# Patient Record
Sex: Female | Born: 1940
Health system: Southern US, Community
[De-identification: ages and names within clinical notes are randomized; demographics above are authoritative.]

## PROBLEM LIST (undated history)

## (undated) DIAGNOSIS — Z5189 Encounter for other specified aftercare: Secondary | ICD-10-CM

## (undated) DIAGNOSIS — Z86010 Personal history of colonic polyps: Secondary | ICD-10-CM

## (undated) DIAGNOSIS — K279 Peptic ulcer, site unspecified, unspecified as acute or chronic, without hemorrhage or perforation: Secondary | ICD-10-CM

## (undated) DIAGNOSIS — J309 Allergic rhinitis, unspecified: Secondary | ICD-10-CM

## (undated) DIAGNOSIS — K219 Gastro-esophageal reflux disease without esophagitis: Secondary | ICD-10-CM

## (undated) DIAGNOSIS — I10 Essential (primary) hypertension: Secondary | ICD-10-CM

## (undated) DIAGNOSIS — I2510 Atherosclerotic heart disease of native coronary artery without angina pectoris: Secondary | ICD-10-CM

## (undated) DIAGNOSIS — M810 Age-related osteoporosis without current pathological fracture: Secondary | ICD-10-CM

## (undated) DIAGNOSIS — G4700 Insomnia, unspecified: Secondary | ICD-10-CM

## (undated) DIAGNOSIS — Z8673 Personal history of transient ischemic attack (TIA), and cerebral infarction without residual deficits: Secondary | ICD-10-CM

## (undated) DIAGNOSIS — E785 Hyperlipidemia, unspecified: Secondary | ICD-10-CM

## (undated) DIAGNOSIS — I480 Paroxysmal atrial fibrillation: Secondary | ICD-10-CM

## (undated) DIAGNOSIS — D649 Anemia, unspecified: Secondary | ICD-10-CM

## (undated) DIAGNOSIS — K5520 Angiodysplasia of colon without hemorrhage: Secondary | ICD-10-CM

## (undated) DIAGNOSIS — I350 Nonrheumatic aortic (valve) stenosis: Secondary | ICD-10-CM

## (undated) DIAGNOSIS — I219 Acute myocardial infarction, unspecified: Secondary | ICD-10-CM

## (undated) DIAGNOSIS — E119 Type 2 diabetes mellitus without complications: Principal | ICD-10-CM

## (undated) DIAGNOSIS — F329 Major depressive disorder, single episode, unspecified: Secondary | ICD-10-CM

## (undated) DIAGNOSIS — M1712 Unilateral primary osteoarthritis, left knee: Secondary | ICD-10-CM

## (undated) HISTORY — DX: Paroxysmal atrial fibrillation: I48.0

## (undated) HISTORY — DX: Unilateral primary osteoarthritis, left knee: M17.12

## (undated) HISTORY — DX: Allergic rhinitis, unspecified: J30.9

## (undated) HISTORY — DX: Hyperlipidemia, unspecified: E78.5

## (undated) HISTORY — DX: Type 2 diabetes mellitus without complications: E11.9

## (undated) HISTORY — PX: APPENDECTOMY: SHX54

## (undated) HISTORY — DX: Nonrheumatic aortic (valve) stenosis: I35.0

## (undated) HISTORY — DX: Anemia, unspecified: D64.9

## (undated) HISTORY — DX: Peptic ulcer, site unspecified, unspecified as acute or chronic, without hemorrhage or perforation: K27.9

## (undated) HISTORY — PX: TONSILLECTOMY: SUR1361

## (undated) HISTORY — DX: Personal history of transient ischemic attack (TIA), and cerebral infarction without residual deficits: Z86.73

## (undated) HISTORY — DX: Angiodysplasia of colon without hemorrhage: K55.20

## (undated) HISTORY — DX: Encounter for other specified aftercare: Z51.89

## (undated) HISTORY — PX: TRANSTHORACIC ECHOCARDIOGRAM: SHX275

## (undated) HISTORY — DX: Major depressive disorder, single episode, unspecified: F32.9

## (undated) HISTORY — DX: Insomnia, unspecified: G47.00

## (undated) HISTORY — DX: Age-related osteoporosis without current pathological fracture: M81.0

## (undated) HISTORY — PX: BREAST BIOPSY: SHX20

## (undated) HISTORY — DX: Acute myocardial infarction, unspecified: I21.9

## (undated) HISTORY — DX: Essential (primary) hypertension: I10

## (undated) HISTORY — DX: Atherosclerotic heart disease of native coronary artery without angina pectoris: I25.10

## (undated) HISTORY — DX: Gastro-esophageal reflux disease without esophagitis: K21.9

## (undated) HISTORY — PX: TUBAL LIGATION: SHX77

## (undated) HISTORY — DX: Personal history of colonic polyps: Z86.010

---

## 2001-10-29 ENCOUNTER — Encounter: Admission: RE | Admit: 2001-10-29 | Discharge: 2001-10-29 | Payer: Self-pay | Admitting: Internal Medicine

## 2001-11-05 ENCOUNTER — Encounter: Admission: RE | Admit: 2001-11-05 | Discharge: 2001-11-05 | Payer: Self-pay | Admitting: Internal Medicine

## 2001-11-11 ENCOUNTER — Encounter: Admission: RE | Admit: 2001-11-11 | Discharge: 2001-11-11 | Payer: Self-pay | Admitting: Internal Medicine

## 2001-11-26 ENCOUNTER — Encounter: Admission: RE | Admit: 2001-11-26 | Discharge: 2001-11-26 | Payer: Self-pay | Admitting: Internal Medicine

## 2001-12-19 ENCOUNTER — Encounter: Admission: RE | Admit: 2001-12-19 | Discharge: 2001-12-19 | Payer: Self-pay | Admitting: Internal Medicine

## 2002-02-06 LAB — HM MAMMOGRAPHY

## 2002-03-19 ENCOUNTER — Encounter: Admission: RE | Admit: 2002-03-19 | Discharge: 2002-03-19 | Payer: Self-pay | Admitting: Internal Medicine

## 2002-03-25 ENCOUNTER — Encounter: Admission: RE | Admit: 2002-03-25 | Discharge: 2002-03-25 | Payer: Self-pay | Admitting: Internal Medicine

## 2002-03-27 ENCOUNTER — Encounter: Admission: RE | Admit: 2002-03-27 | Discharge: 2002-03-27 | Payer: Self-pay | Admitting: Internal Medicine

## 2002-06-12 ENCOUNTER — Encounter: Admission: RE | Admit: 2002-06-12 | Discharge: 2002-06-12 | Payer: Self-pay | Admitting: Internal Medicine

## 2002-06-12 ENCOUNTER — Ambulatory Visit (HOSPITAL_COMMUNITY): Admission: RE | Admit: 2002-06-12 | Discharge: 2002-06-12 | Payer: Self-pay | Admitting: Internal Medicine

## 2002-07-10 ENCOUNTER — Encounter: Admission: RE | Admit: 2002-07-10 | Discharge: 2002-07-10 | Payer: Self-pay | Admitting: Internal Medicine

## 2002-10-03 ENCOUNTER — Encounter: Admission: RE | Admit: 2002-10-03 | Discharge: 2002-10-03 | Payer: Self-pay | Admitting: Internal Medicine

## 2002-10-23 ENCOUNTER — Ambulatory Visit (HOSPITAL_COMMUNITY): Admission: RE | Admit: 2002-10-23 | Discharge: 2002-10-23 | Payer: Self-pay | Admitting: Internal Medicine

## 2002-10-23 ENCOUNTER — Encounter: Payer: Self-pay | Admitting: Cardiology

## 2002-11-17 ENCOUNTER — Encounter: Admission: RE | Admit: 2002-11-17 | Discharge: 2002-11-17 | Payer: Self-pay | Admitting: Internal Medicine

## 2002-11-25 ENCOUNTER — Emergency Department (HOSPITAL_COMMUNITY): Admission: EM | Admit: 2002-11-25 | Discharge: 2002-11-25 | Payer: Self-pay | Admitting: Emergency Medicine

## 2002-12-08 ENCOUNTER — Encounter: Admission: RE | Admit: 2002-12-08 | Discharge: 2002-12-08 | Payer: Self-pay | Admitting: Internal Medicine

## 2002-12-16 ENCOUNTER — Encounter: Admission: RE | Admit: 2002-12-16 | Discharge: 2002-12-16 | Payer: Self-pay | Admitting: Internal Medicine

## 2002-12-26 ENCOUNTER — Encounter: Admission: RE | Admit: 2002-12-26 | Discharge: 2002-12-26 | Payer: Self-pay | Admitting: Internal Medicine

## 2003-03-05 ENCOUNTER — Encounter: Admission: RE | Admit: 2003-03-05 | Discharge: 2003-03-05 | Payer: Self-pay | Admitting: Internal Medicine

## 2003-07-16 ENCOUNTER — Encounter: Admission: RE | Admit: 2003-07-16 | Discharge: 2003-07-16 | Payer: Self-pay | Admitting: Internal Medicine

## 2003-10-13 ENCOUNTER — Encounter: Admission: RE | Admit: 2003-10-13 | Discharge: 2004-01-11 | Payer: Self-pay | Admitting: Internal Medicine

## 2004-09-21 ENCOUNTER — Ambulatory Visit: Payer: Self-pay | Admitting: Internal Medicine

## 2004-09-28 ENCOUNTER — Ambulatory Visit: Payer: Self-pay | Admitting: Internal Medicine

## 2005-02-22 ENCOUNTER — Ambulatory Visit: Payer: Self-pay | Admitting: Internal Medicine

## 2005-12-01 ENCOUNTER — Ambulatory Visit: Payer: Self-pay | Admitting: Internal Medicine

## 2005-12-01 LAB — CONVERTED CEMR LAB
ALT: 29 units/L (ref 0–40)
AST: 22 units/L (ref 0–37)
Albumin: 3.7 g/dL (ref 3.5–5.2)
Alkaline Phosphatase: 48 units/L (ref 39–117)
BUN: 11 mg/dL (ref 6–23)
Bacteria, U Microscopic: NEGATIVE /hpf
Basophils Absolute: 0 10*3/uL (ref 0.0–0.1)
Basophils Relative: 0.5 % (ref 0.0–1.0)
Bilirubin Urine: NEGATIVE
CO2: 31 meq/L (ref 19–32)
Calcium: 10.3 mg/dL (ref 8.4–10.5)
Chloride: 106 meq/L (ref 96–112)
Chol/HDL Ratio, serum: 4.8
Cholesterol: 170 mg/dL (ref 0–200)
Creatinine, Ser: 0.9 mg/dL (ref 0.4–1.2)
Crystals: NEGATIVE
Eosinophil percent: 2.3 % (ref 0.0–5.0)
GFR calc non Af Amer: 66 mL/min
Glomerular Filtration Rate, Af Am: 80 mL/min/{1.73_m2}
Glucose, Bld: 127 mg/dL — ABNORMAL HIGH (ref 70–99)
HCT: 39.4 % (ref 36.0–46.0)
HDL: 35.5 mg/dL — ABNORMAL LOW (ref >39.0)
Hemoglobin, Urine: NEGATIVE
Hemoglobin: 13.4 g/dL (ref 12.0–15.0)
Hgb A1c MFr Bld: 6.1 % — ABNORMAL HIGH (ref 4.6–6.0)
Ketones, ur: NEGATIVE mg/dL
LDL Cholesterol: 117 mg/dL — ABNORMAL HIGH (ref 0–99)
Lymphocytes Relative: 39.4 % (ref 12.0–46.0)
MCHC: 33.9 g/dL (ref 30.0–36.0)
MCV: 95.5 fL (ref 78.0–100.0)
Monocytes Absolute: 0.4 10*3/uL (ref 0.2–0.7)
Monocytes Relative: 8.4 % (ref 3.0–11.0)
Mucus, UA: NEGATIVE
Neutro Abs: 2.2 10*3/uL (ref 1.4–7.7)
Neutrophils Relative %: 49.4 % (ref 43.0–77.0)
Nitrite: NEGATIVE
Platelets: 249 10*3/uL (ref 150–400)
Potassium: 4.5 meq/L (ref 3.5–5.1)
RBC / HPF: NONE SEEN
RBC: 4.12 M/uL (ref 3.87–5.11)
RDW: 11.7 % (ref 11.5–14.6)
Sodium: 139 meq/L (ref 135–145)
Specific Gravity, Urine: 1.02 (ref 1.000–1.03)
TSH: 2.43 microintl units/mL (ref 0.35–5.50)
Total Bilirubin: 0.8 mg/dL (ref 0.3–1.2)
Total Protein, Urine: NEGATIVE mg/dL
Total Protein: 6.9 g/dL (ref 6.0–8.3)
Triglyceride fasting, serum: 90 mg/dL (ref 0–149)
Urine Glucose: NEGATIVE mg/dL
Urobilinogen, UA: 0.2 (ref 0.0–1.0)
VLDL: 18 mg/dL (ref 0–40)
WBC: 4.5 10*3/uL (ref 4.5–10.5)
pH: 6.5 (ref 5.0–8.0)

## 2005-12-05 ENCOUNTER — Ambulatory Visit: Payer: Self-pay | Admitting: Family Medicine

## 2006-02-07 ENCOUNTER — Ambulatory Visit: Payer: Self-pay | Admitting: Internal Medicine

## 2006-10-05 ENCOUNTER — Encounter: Payer: Self-pay | Admitting: *Deleted

## 2006-10-05 DIAGNOSIS — R011 Cardiac murmur, unspecified: Secondary | ICD-10-CM

## 2006-10-05 DIAGNOSIS — E1122 Type 2 diabetes mellitus with diabetic chronic kidney disease: Secondary | ICD-10-CM | POA: Insufficient documentation

## 2006-10-05 DIAGNOSIS — E119 Type 2 diabetes mellitus without complications: Secondary | ICD-10-CM

## 2006-10-05 DIAGNOSIS — E1165 Type 2 diabetes mellitus with hyperglycemia: Secondary | ICD-10-CM | POA: Insufficient documentation

## 2006-10-05 HISTORY — DX: Type 2 diabetes mellitus without complications: E11.9

## 2006-10-08 DIAGNOSIS — F418 Other specified anxiety disorders: Secondary | ICD-10-CM | POA: Insufficient documentation

## 2006-10-08 DIAGNOSIS — K279 Peptic ulcer, site unspecified, unspecified as acute or chronic, without hemorrhage or perforation: Secondary | ICD-10-CM | POA: Insufficient documentation

## 2006-10-08 DIAGNOSIS — I10 Essential (primary) hypertension: Secondary | ICD-10-CM

## 2006-10-08 DIAGNOSIS — E1169 Type 2 diabetes mellitus with other specified complication: Secondary | ICD-10-CM | POA: Insufficient documentation

## 2006-10-08 DIAGNOSIS — E785 Hyperlipidemia, unspecified: Secondary | ICD-10-CM

## 2006-10-08 DIAGNOSIS — F329 Major depressive disorder, single episode, unspecified: Secondary | ICD-10-CM

## 2006-10-08 DIAGNOSIS — F3289 Other specified depressive episodes: Secondary | ICD-10-CM

## 2006-10-08 HISTORY — DX: Major depressive disorder, single episode, unspecified: F32.9

## 2006-10-08 HISTORY — DX: Peptic ulcer, site unspecified, unspecified as acute or chronic, without hemorrhage or perforation: K27.9

## 2006-10-08 HISTORY — DX: Other specified depressive episodes: F32.89

## 2006-10-08 HISTORY — DX: Essential (primary) hypertension: I10

## 2006-10-08 HISTORY — DX: Hyperlipidemia, unspecified: E78.5

## 2007-02-11 ENCOUNTER — Encounter: Payer: Self-pay | Admitting: Internal Medicine

## 2007-02-25 ENCOUNTER — Encounter: Payer: Self-pay | Admitting: Internal Medicine

## 2007-06-24 ENCOUNTER — Ambulatory Visit: Payer: Self-pay | Admitting: Internal Medicine

## 2007-06-24 DIAGNOSIS — Z860100 Personal history of colon polyps, unspecified: Secondary | ICD-10-CM | POA: Insufficient documentation

## 2007-06-24 DIAGNOSIS — K219 Gastro-esophageal reflux disease without esophagitis: Secondary | ICD-10-CM

## 2007-06-24 DIAGNOSIS — J309 Allergic rhinitis, unspecified: Secondary | ICD-10-CM | POA: Insufficient documentation

## 2007-06-24 HISTORY — DX: Gastro-esophageal reflux disease without esophagitis: K21.9

## 2007-06-24 HISTORY — DX: Personal history of colon polyps, unspecified: Z86.0100

## 2007-06-24 HISTORY — DX: Allergic rhinitis, unspecified: J30.9

## 2007-06-24 HISTORY — DX: Personal history of colonic polyps: Z86.010

## 2007-06-25 ENCOUNTER — Telehealth: Payer: Self-pay | Admitting: Internal Medicine

## 2008-06-17 ENCOUNTER — Telehealth: Payer: Self-pay | Admitting: Internal Medicine

## 2008-09-24 ENCOUNTER — Ambulatory Visit: Payer: Self-pay | Admitting: Internal Medicine

## 2008-09-24 DIAGNOSIS — G4700 Insomnia, unspecified: Secondary | ICD-10-CM | POA: Insufficient documentation

## 2008-09-24 HISTORY — DX: Insomnia, unspecified: G47.00

## 2008-09-25 ENCOUNTER — Telehealth: Payer: Self-pay | Admitting: Internal Medicine

## 2009-03-25 ENCOUNTER — Telehealth: Payer: Self-pay | Admitting: Internal Medicine

## 2009-03-26 ENCOUNTER — Ambulatory Visit: Payer: Self-pay | Admitting: Internal Medicine

## 2009-12-07 ENCOUNTER — Encounter: Payer: Self-pay | Admitting: Internal Medicine

## 2009-12-07 ENCOUNTER — Ambulatory Visit: Payer: Self-pay | Admitting: Internal Medicine

## 2009-12-07 DIAGNOSIS — N959 Unspecified menopausal and perimenopausal disorder: Secondary | ICD-10-CM | POA: Insufficient documentation

## 2009-12-07 LAB — CONVERTED CEMR LAB
ALT: 59 units/L — ABNORMAL HIGH (ref 0–35)
AST: 61 units/L — ABNORMAL HIGH (ref 0–37)
Albumin: 3.8 g/dL (ref 3.5–5.2)
Alkaline Phosphatase: 74 units/L (ref 39–117)
BUN: 21 mg/dL (ref 6–23)
Basophils Absolute: 0 10*3/uL (ref 0.0–0.1)
Basophils Relative: 0.3 % (ref 0.0–3.0)
Bilirubin Urine: NEGATIVE
Bilirubin, Direct: 0.2 mg/dL (ref 0.0–0.3)
CO2: 31 meq/L (ref 19–32)
Calcium: 10.2 mg/dL (ref 8.4–10.5)
Chloride: 99 meq/L (ref 96–112)
Cholesterol: 109 mg/dL (ref 0–200)
Creatinine, Ser: 1.1 mg/dL (ref 0.4–1.2)
Creatinine,U: 92.8 mg/dL
Eosinophils Absolute: 0 10*3/uL (ref 0.0–0.7)
Eosinophils Relative: 0.6 % (ref 0.0–5.0)
GFR calc non Af Amer: 53.7 mL/min (ref >60)
Glucose, Bld: 214 mg/dL — ABNORMAL HIGH (ref 70–99)
HCT: 41.5 % (ref 36.0–46.0)
HDL: 29.8 mg/dL — ABNORMAL LOW (ref >39.00)
Hemoglobin, Urine: NEGATIVE
Hemoglobin: 14.3 g/dL (ref 12.0–15.0)
Hgb A1c MFr Bld: 9.5 % — ABNORMAL HIGH (ref 4.6–6.5)
Ketones, ur: NEGATIVE mg/dL
LDL Cholesterol: 63 mg/dL (ref 0–99)
Lymphocytes Relative: 26.5 % (ref 12.0–46.0)
Lymphs Abs: 2.2 10*3/uL (ref 0.7–4.0)
MCHC: 34.5 g/dL (ref 30.0–36.0)
MCV: 97.1 fL (ref 78.0–100.0)
Microalb Creat Ratio: 3.8 mg/g (ref 0.0–30.0)
Microalb, Ur: 3.5 mg/dL — ABNORMAL HIGH (ref 0.0–1.9)
Monocytes Absolute: 0.5 10*3/uL (ref 0.1–1.0)
Monocytes Relative: 6.1 % (ref 3.0–12.0)
Neutro Abs: 5.5 10*3/uL (ref 1.4–7.7)
Neutrophils Relative %: 66.5 % (ref 43.0–77.0)
Nitrite: NEGATIVE
Platelets: 215 10*3/uL (ref 150.0–400.0)
Potassium: 4.6 meq/L (ref 3.5–5.1)
RBC: 4.27 M/uL (ref 3.87–5.11)
RDW: 12.9 % (ref 11.5–14.6)
Sodium: 138 meq/L (ref 135–145)
Specific Gravity, Urine: 1.02 (ref 1.000–1.030)
TSH: 3.58 microintl units/mL (ref 0.35–5.50)
Total Bilirubin: 1.1 mg/dL (ref 0.3–1.2)
Total CHOL/HDL Ratio: 4
Total Protein, Urine: NEGATIVE mg/dL
Total Protein: 7.2 g/dL (ref 6.0–8.3)
Triglycerides: 80 mg/dL (ref 0.0–149.0)
Urine Glucose: NEGATIVE mg/dL
Urobilinogen, UA: 0.2 (ref 0.0–1.0)
VLDL: 16 mg/dL (ref 0.0–40.0)
WBC: 8.3 10*3/uL (ref 4.5–10.5)
pH: 5 (ref 5.0–8.0)

## 2009-12-09 ENCOUNTER — Encounter (INDEPENDENT_AMBULATORY_CARE_PROVIDER_SITE_OTHER): Payer: Self-pay | Admitting: *Deleted

## 2009-12-17 ENCOUNTER — Encounter (INDEPENDENT_AMBULATORY_CARE_PROVIDER_SITE_OTHER): Payer: Self-pay | Admitting: *Deleted

## 2009-12-22 ENCOUNTER — Ambulatory Visit: Payer: Self-pay | Admitting: Gastroenterology

## 2009-12-24 ENCOUNTER — Telehealth: Payer: Self-pay | Admitting: Gastroenterology

## 2009-12-24 ENCOUNTER — Telehealth (INDEPENDENT_AMBULATORY_CARE_PROVIDER_SITE_OTHER): Payer: Self-pay | Admitting: *Deleted

## 2010-01-03 ENCOUNTER — Ambulatory Visit: Payer: Self-pay | Admitting: Gastroenterology

## 2010-01-03 LAB — HM COLONOSCOPY

## 2010-01-19 ENCOUNTER — Ambulatory Visit: Payer: Self-pay | Admitting: Internal Medicine

## 2010-01-21 ENCOUNTER — Encounter: Payer: Self-pay | Admitting: Internal Medicine

## 2010-01-21 LAB — CONVERTED CEMR LAB
BUN: 17 mg/dL (ref 6–23)
CO2: 28 meq/L (ref 19–32)
Calcium: 10.1 mg/dL (ref 8.4–10.5)
Chloride: 103 meq/L (ref 96–112)
Creatinine, Ser: 0.7 mg/dL (ref 0.4–1.2)
GFR calc non Af Amer: 82.16 mL/min (ref >60.00)
Glucose, Bld: 147 mg/dL — ABNORMAL HIGH (ref 70–99)
Hgb A1c MFr Bld: 7.7 % — ABNORMAL HIGH (ref 4.6–6.5)
Potassium: 4.4 meq/L (ref 3.5–5.1)
Sodium: 139 meq/L (ref 135–145)

## 2010-01-24 LAB — CONVERTED CEMR LAB
HCV Ab: NEGATIVE
Hep A IgM: NEGATIVE
Hep B C IgM: NEGATIVE
Hepatitis B Surface Ag: NEGATIVE

## 2010-03-06 LAB — CONVERTED CEMR LAB
ALT: 46 units/L — ABNORMAL HIGH (ref 0–35)
ALT: 78 units/L — ABNORMAL HIGH (ref 0–35)
AST: 31 units/L (ref 0–37)
AST: 69 units/L — ABNORMAL HIGH (ref 0–37)
Albumin: 3.8 g/dL (ref 3.5–5.2)
Albumin: 3.9 g/dL (ref 3.5–5.2)
Alkaline Phosphatase: 60 units/L (ref 39–117)
Alkaline Phosphatase: 62 units/L (ref 39–117)
BUN: 19 mg/dL (ref 6–23)
BUN: 22 mg/dL (ref 6–23)
Bacteria, UA: NEGATIVE
Basophils Absolute: 0 10*3/uL (ref 0.0–0.1)
Basophils Absolute: 0 10*3/uL (ref 0.0–0.1)
Basophils Relative: 0 % (ref 0.0–3.0)
Basophils Relative: 0.5 % (ref 0.0–1.0)
Bilirubin Urine: NEGATIVE
Bilirubin, Direct: 0.1 mg/dL (ref 0.0–0.3)
Bilirubin, Direct: 0.2 mg/dL (ref 0.0–0.3)
CO2: 31 meq/L (ref 19–32)
CO2: 32 meq/L (ref 19–32)
Calcium: 10 mg/dL (ref 8.4–10.5)
Calcium: 10.2 mg/dL (ref 8.4–10.5)
Chloride: 101 meq/L (ref 96–112)
Chloride: 101 meq/L (ref 96–112)
Cholesterol: 113 mg/dL (ref 0–200)
Cholesterol: 174 mg/dL (ref 0–200)
Creatinine, Ser: 0.9 mg/dL (ref 0.4–1.2)
Creatinine, Ser: 0.9 mg/dL (ref 0.4–1.2)
Creatinine,U: 118.5 mg/dL
Crystals: NEGATIVE
Eosinophils Absolute: 0.1 10*3/uL (ref 0.0–0.7)
Eosinophils Absolute: 0.1 10*3/uL (ref 0.0–0.7)
Eosinophils Relative: 2 % (ref 0.0–5.0)
Eosinophils Relative: 2.6 % (ref 0.0–5.0)
GFR calc Af Amer: 80 mL/min
GFR calc non Af Amer: 65.8 mL/min (ref >60)
GFR calc non Af Amer: 66 mL/min
Glucose, Bld: 145 mg/dL — ABNORMAL HIGH (ref 70–99)
Glucose, Bld: 150 mg/dL — ABNORMAL HIGH (ref 70–99)
HCT: 38.5 % (ref 36.0–46.0)
HCT: 41.1 % (ref 36.0–46.0)
HDL: 32.9 mg/dL — ABNORMAL LOW (ref >39.00)
HDL: 33.5 mg/dL — ABNORMAL LOW (ref >39.0)
Hemoglobin, Urine: NEGATIVE
Hemoglobin: 13.3 g/dL (ref 12.0–15.0)
Hemoglobin: 14 g/dL (ref 12.0–15.0)
Hgb A1c MFr Bld: 7.2 % — ABNORMAL HIGH (ref 4.6–6.0)
Hgb A1c MFr Bld: 7.5 % — ABNORMAL HIGH (ref 4.6–6.5)
Ketones, ur: NEGATIVE mg/dL
LDL Cholesterol: 126 mg/dL — ABNORMAL HIGH (ref 0–99)
LDL Cholesterol: 66 mg/dL (ref 0–99)
Lymphocytes Relative: 39.4 % (ref 12.0–46.0)
Lymphocytes Relative: 41.6 % (ref 12.0–46.0)
Lymphs Abs: 2.2 10*3/uL (ref 0.7–4.0)
MCHC: 34.1 g/dL (ref 30.0–36.0)
MCHC: 34.5 g/dL (ref 30.0–36.0)
MCV: 95.8 fL (ref 78.0–100.0)
MCV: 96.9 fL (ref 78.0–100.0)
Microalb Creat Ratio: 5.1 mg/g (ref 0.0–30.0)
Microalb, Ur: 0.6 mg/dL (ref 0.0–1.9)
Monocytes Absolute: 0.4 10*3/uL (ref 0.1–1.0)
Monocytes Absolute: 0.4 10*3/uL (ref 0.1–1.0)
Monocytes Relative: 7.8 % (ref 3.0–12.0)
Monocytes Relative: 9.8 % (ref 3.0–12.0)
Mucus, UA: NEGATIVE
Neutro Abs: 2.1 10*3/uL (ref 1.4–7.7)
Neutro Abs: 2.9 10*3/uL (ref 1.4–7.7)
Neutrophils Relative %: 45.5 % (ref 43.0–77.0)
Neutrophils Relative %: 50.8 % (ref 43.0–77.0)
Nitrite: NEGATIVE
Platelets: 198 10*3/uL (ref 150.0–400.0)
Platelets: 221 10*3/uL (ref 150–400)
Potassium: 4 meq/L (ref 3.5–5.1)
Potassium: 4.4 meq/L (ref 3.5–5.1)
RBC / HPF: NONE SEEN
RBC: 4.02 M/uL (ref 3.87–5.11)
RBC: 4.25 M/uL (ref 3.87–5.11)
RDW: 11.9 % (ref 11.5–14.6)
RDW: 12.1 % (ref 11.5–14.6)
Sodium: 138 meq/L (ref 135–145)
Sodium: 140 meq/L (ref 135–145)
Specific Gravity, Urine: 1.015 (ref 1.000–1.03)
TSH: 3.03 microintl units/mL (ref 0.35–5.50)
TSH: 4.15 microintl units/mL (ref 0.35–5.50)
Total Bilirubin: 0.9 mg/dL (ref 0.3–1.2)
Total Bilirubin: 1.2 mg/dL (ref 0.3–1.2)
Total CHOL/HDL Ratio: 3
Total CHOL/HDL Ratio: 5.2
Total Protein, Urine: NEGATIVE mg/dL
Total Protein: 7.1 g/dL (ref 6.0–8.3)
Total Protein: 7.2 g/dL (ref 6.0–8.3)
Triglycerides: 70 mg/dL (ref 0.0–149.0)
Triglycerides: 75 mg/dL (ref 0–149)
Urine Glucose: NEGATIVE mg/dL
Urobilinogen, UA: 0.2 (ref 0.0–1.0)
VLDL: 14 mg/dL (ref 0.0–40.0)
VLDL: 15 mg/dL (ref 0–40)
WBC: 4.4 10*3/uL — ABNORMAL LOW (ref 4.5–10.5)
WBC: 5.6 10*3/uL (ref 4.5–10.5)
pH: 6 (ref 5.0–8.0)

## 2010-03-08 NOTE — Letter (Signed)
Summary: Moviprep Instructions  [facility]  [address]   [place], [place] [address]   Phone: [phone]  Fax: [phone]       Mary Mccarthy    [birth year]    MRN: [id number]        Procedure Day /Date: Monday, 01-03-10     Arrival Time: 8:00 a.m.      Procedure Time: 9:00 a.m.     Location of Procedure:                    x   [facility] (4th Floor)   PREPARATION FOR COLONOSCOPY WITH MOVIPREP   Starting 5 days prior to your procedure 12-29-09  do not eat nuts, seeds, popcorn, corn, beans, peas,  salads, or any raw vegetables.  Do not take any fiber supplements (e.g. Metamucil, Citrucel, and Benefiber).  THE DAY BEFORE YOUR PROCEDURE         DATE: 01-02-10  DAY: Sunday  1.  Drink clear liquids the entire day-NO SOLID FOOD  2.  Do not drink anything colored red or purple.  Avoid juices with pulp.  No orange juice.  3.  Drink at least 64 oz. (8 glasses) of fluid/clear liquids during the day to prevent dehydration and help the prep work efficiently.  CLEAR LIQUIDS INCLUDE: Water Jello Ice Popsicles Tea (sugar ok, no milk/cream) Powdered fruit flavored drinks Coffee (sugar ok, no milk/cream) Gatorade Juice: apple, white grape, white cranberry  Lemonade Clear bullion, consomm, broth Carbonated beverages (any kind) Strained chicken noodle soup Hard Candy                             4.  In the morning, mix first dose of MoviPrep solution:    Empty 1 Pouch A and 1 Pouch B into the disposable container    Add lukewarm drinking water to the top line of the container. Mix to dissolve    Refrigerate (mixed solution should be used within 24 hrs)  5.  Begin drinking the prep at 5:00 p.m. The MoviPrep container is divided by 4 marks.   Every 15 minutes drink the solution down to the next mark (approximately 8 oz) until the full liter is complete.   6.  Follow completed prep with 16 oz of clear liquid of your choice (Nothing red or  purple).  Continue to drink clear liquids until bedtime.  7.  Before going to bed, mix second dose of MoviPrep solution:    Empty 1 Pouch A and 1 Pouch B into the disposable container    Add lukewarm drinking water to the top line of the container. Mix to dissolve    Refrigerate  THE DAY OF YOUR PROCEDURE      DATE: 01-03-10  DAY: Monday  Beginning at 4:00 a.m. (5 hours before procedure):         1. Every 15 minutes, drink the solution down to the next mark (approx 8 oz) until the full liter is complete.  2. Follow completed prep with 16 oz. of clear liquid of your choice.    3. You may drink clear liquids until 7:00 a.m.  (2 HOURS BEFORE PROCEDURE).   MEDICATION INSTRUCTIONS  Unless otherwise instructed, you should take regular prescription medications with a small sip of water   as early as possible the morning of your procedure.  Diabetic patients - see separate instructions.   Additional medication instructions: Hold HCTZ the  morning of procedure.         OTHER INSTRUCTIONS  You will need a responsible adult at least 72 years of age to accompany you and drive you home.   This person must remain in the waiting room during your procedure.  Wear loose fitting clothing that is easily removed.  Leave jewelry and other valuables at home.  However, you may wish to bring a book to read or  an iPod/MP3 player to listen to music as you wait for your procedure to start.  Remove all body piercing jewelry and leave at home.  Total time from sign-in until discharge is approximately 2-3 hours.  You should go home directly after your procedure and rest.  You can resume normal activities the  day after your procedure.  The day of your procedure you should not:   Drive   Make legal decisions   Operate machinery   Drink alcohol   Return to work  You will receive specific instructions about eating, activities and medications before you leave.    The above  instructions have been reviewed and explained to me by   Wyona Almas RN  December 22, 2009 8:28 AM     I fully understand and can verbalize these instructions _____________________________ Date _________

## 2010-03-08 NOTE — Progress Notes (Signed)
Summary: pt?  Phone Note Call from Patient Call back at Home Phone [phone]   Caller: Nilsa Nutting Summary of Call: pt's spouse called to ask MD if pt has been exposed to flu (no fever, cough SOB) is it okay for her to have flu shot. pt is schedule tomorrow for nurse visit. Pt's spouse says that they have been told that if exposed, pt could develop much worse flu sys. please advise Initial call taken by: Margaret Pyle, CMA,  March 25, 2009 11:33 AM  Follow-up for Phone Call        no, flu shot should be ok - ok to make nurse OV for shot as long as no high fever, pregnancy, or allergy to the shot Follow-up by: Corwin Levins MD,  March 25, 2009 12:55 PM  Additional Follow-up for Phone Call Additional follow up Details #1::        pt' spouse informed Additional Follow-up by: Margaret Pyle, CMA,  March 25, 2009 1:27 PM

## 2010-03-08 NOTE — Procedures (Signed)
Summary: Colonoscopy  Patient: Mary Mccarthy Note: All result statuses are Final unless otherwise noted.  Tests: (1) Colonoscopy (COL)   COL Colonoscopy           DONE     [facility]     [address]     [place], [place]  [address]           COLONOSCOPY PROCEDURE REPORT           PATIENT:  Mary Mccarthy, Mary Mccarthy  MR#:  [id number]     BIRTHDATE:  Jul 24, 1940, 69 yrs. old  GENDER:  female     ENDOSCOPIST:  Vania Rea. Jarold Motto, MD, [facility]     REF. BY:     PROCEDURE DATE:  01/03/2010     PROCEDURE:  Higher-risk screening colonoscopy G0105           ASA CLASS:  Class II     INDICATIONS:  history of polyps     MEDICATIONS:   Fentanyl 50 mcg IV, Versed 6 mg IV           DESCRIPTION OF PROCEDURE:   After the risks benefits and     alternatives of the procedure were thoroughly explained, informed     consent was obtained.  Digital rectal exam was performed and     revealed no abnormalities.   The LB CF-[id number] endoscope     was introduced through the anus and advanced to the cecum, which     was identified by both the appendix and ileocecal valve, without     limitations.  The quality of the prep was excellent, using     MoviPrep.  The instrument was then slowly withdrawn as the colon     was fully examined.     <<PROCEDUREIMAGES>>           FINDINGS:  No polyps or cancers were seen.  This was otherwise a     normal examination of the colon.   Retroflexed views in the rectum     revealed no abnormalities.    The scope was then withdrawn from     the patient and the procedure completed.           COMPLICATIONS:  None     ENDOSCOPIC IMPRESSION:     1) No polyps or cancers     2) Otherwise normal examination     RECOMMENDATIONS:     1) Continue current colorectal screening recommendations for     "routine risk" patients with a repeat colonoscopy in 10 years.     REPEAT EXAM:  No           ______________________________     Vania Rea. Jarold Motto, MD, [facility]           CC:   Corwin Levins, MD           n.     Rosalie DoctorMarland Kitchen   Vania Rea. Patterson at 01/03/2010 09:38 AM           Mary Mccarthy, [id number]  Note: An exclamation mark (!) indicates a result that was not dispersed into the flowsheet. Document Creation Date: 01/03/2010 9:38 AM _______________________________________________________________________  (1) Order result status: Final Collection or observation date-time: 01/03/2010 09:32 Requested date-time:  Receipt date-time:  Reported date-time:  Referring Physician:   Ordering Physician: Sheryn Bison [phone]) Specimen Source:  Source: Launa Grill Order Number: [id number] Lab site:   Appended Document: Colonoscopy    Clinical Lists Changes  Observations: Added new  observation of COLONNXTDUE: 12/2019 (01/03/2010 10:54)

## 2010-03-08 NOTE — Progress Notes (Signed)
Summary: Medication  Phone Note Call from Patient Call back at Home Phone [phone]   Caller: Patient Call For: Dr. Jarold Motto Reason for Call: Talk to Nurse Summary of Call: Moviprep is too expensive...Marland Kitchenneeds an alternative med Initial call taken by: Karna Christmas,  December 24, 2009 1:42 PM  Follow-up for Phone Call        Spoke with husband, offered the patient $20 mail in rebate and he states that she is aware of this and that she still cannot afford the movi prep it is $80.  He said that she may call back monday to cancel the procedure he would let her make the decision.  Encourage him to tell patient to call back and ask to speak with Dr.Patterson's nurse to see if there is something she could do for the patient. He understands. Follow-up by: Sherren Kerns RN,  December 24, 2009 3:24 PM

## 2010-03-08 NOTE — Miscellaneous (Signed)
Summary: [facility] Previsit/prep  Clinical Lists Changes  Medications: Added new medication of MOVIPREP 100 GM  SOLR (PEG-KCL-NACL-NASULF-NA ASC-C) As per prep instructions. - Signed Rx of MOVIPREP 100 GM  SOLR (PEG-KCL-NACL-NASULF-NA ASC-C) As per prep instructions.;  #1 x 0;  Signed;  Entered by: Wyona Almas RN;  Authorized by: Mardella Layman MD [facility];  Method used: Electronically to [facility] [phone]*, [address], [place], [place]  [address], Ph: [phone], Fax: [phone] Observations: Added new observation of ALLERGY REV: Done (12/22/2009 8:16)    Prescriptions: MOVIPREP 100 GM  SOLR (PEG-KCL-NACL-NASULF-NA ASC-C) As per prep instructions.  #1 x 0   Entered by:   Wyona Almas RN   Authorized by:   Mardella Layman MD [facility]   Signed by:   Wyona Almas RN on 12/22/2009   Method used:   Electronically to        [facility] [phone]* (retail)       [address]       [place], [place]  [address]       Ph: [phone]       Fax: [phone]   RxID:   [id number]

## 2010-03-08 NOTE — Letter (Signed)
Summary: Diabetic Instructions  [facility]  [address]   [place], [place] [address]   Phone: [phone]  Fax: [phone]    Mary Mccarthy 22-Sep-1940 MRN: [id number]    x    ORAL DIABETIC MEDICATION INSTRUCTIONS  The day before your procedure:   Take your diabetic pill as you do normally  The day of your procedure:   Do not take your diabetic pill    We will check your blood sugar levels during the admission process and again in Recovery before discharging you home  ________________________________________________________________________

## 2010-03-08 NOTE — Letter (Signed)
Summary: Pre Visit Letter Revised  [facility]  [address] [place], [place] [address]   Phone: [phone]  Fax: [phone]        12/09/2009 MRN: [id number] Mary Mccarthy [address] [place], [place]  [address]             Procedure Date:  01/03/2010   Welcome to the Gastroenterology Division at [facility].    You are scheduled to see a nurse for your pre-procedure visit on 12/22/2009 at 8:00AM on the 3rd floor at [facility], [address]  We ask that you try to arrive at our office 15 minutes prior to your appointment time to allow for check-in.  Please take a minute to review the attached form.  If you answer "Yes" to one or more of the questions on the first page, we ask that you call the person listed at your earliest opportunity.  If you answer "No" to all of the questions, please complete the rest of the form and bring it to your appointment.    Your nurse visit will consist of discussing your medical and surgical history, your immediate family medical history, and your medications.   If you are unable to list all of your medications on the form, please bring the medication bottles to your appointment and we will list them.  We will need to be aware of both prescribed and over the counter drugs.  We will need to know exact dosage information as well.    Please be prepared to read and sign documents such as consent forms, a financial agreement, and acknowledgement forms.  If necessary, and with your consent, a friend or relative is welcome to sit-in on the nurse visit with you.  Please bring your insurance card so that we may make a copy of it.  If your insurance requires a referral to see a specialist, please bring your referral form from your primary care physician.  No co-pay is required for this nurse visit.     If you cannot keep your appointment, please call [phone] to cancel or reschedule prior to your appointment date.  This allows  [place] the opportunity to schedule an appointment for another patient in need of care.    Thank you for choosing [facility] for your medical needs.  We appreciate the opportunity to care for you.  Please visit [place] at our website  to learn more about our practice.  Sincerely, The Gastroenterology Division

## 2010-03-08 NOTE — Assessment & Plan Note (Signed)
Summary: YEARLY MEDICARE/LB   Vital Signs:  Patient profile:   70 year old female Height:      61.5 inches Weight:      221.38 pounds BMI:     41.30 O2 Sat:      94 % on Room air Temp:     98.6 degrees F oral Pulse rate:   81 / minute BP sitting:   100 / 62  (left arm) Cuff size:   large  Vitals Entered By: Zella Ball Ewing CMA (AAMA) (December 07, 2009 8:43 AM)  O2 Flow:  Room air  CC: Yearly medicare/RE   CC:  Yearly medicare/RE.  History of Present Illness: here for wellness, has twin sister who is criticlaly ill, lives only 3 blocks away and the family depeends on her;  c/o ongoing stress and anxiety but Denies worsening depressive symptoms, suicidal ideation, or panic.   Pt denies CP, worsening sob, doe, wheezing, orthopnea, pnd, worsening LE edema, palps, dizziness or syncope  Pt denies new neuro symptoms such as headache, facial or extremity weakness   No fever, wt loss, night sweats, loss of appetite or other constitutional symptoms  Pt states good ability with ADL's, low fall risk, home safety reviewed and adequate, no significant change in hearing or vision, trying to follow lower chol diet, and occasionally active only with regular excercise. Does have 4 wks right lower back pain without signficant radiation, overall much improved but still mild, not sure what caused it;  pain worse in the am so thinks it may have something to do with sleep;  no LE pain/weak/numb, gait change or fall.  Worse to bend or twist or "move the wrong way"  No bowel or bladder changes.   Preventive Screening-Counseling & Management      Drug Use:  no.    Problems Prior to Update: 1)  Menopausal Disorder  (ICD-627.9) 2)  Need Prophylactic Vaccination&inoculation Flu  (ICD-V04.81) 3)  Insomnia  (ICD-780.52) 4)  Allergic Rhinitis  (ICD-477.9) 5)  Preventive Health Care  (ICD-V70.0) 6)  Colonic Polyps, Hx of  (ICD-V12.72) 7)  Gerd  (ICD-530.81) 8)  Depression  (ICD-311) 9)  Morbid Obesity   (ICD-278.01) 10)  Murmur  (ICD-785.2) 11)  Peptic Ulcer Disease  (ICD-533.90) 12)  Hypertension  (ICD-401.9) 13)  Hyperlipidemia  (ICD-272.4) 14)  Diabetes Mellitus, Type II  (ICD-250.00)  Medications Prior to Update: 1)  Amlodipine Besylate 10 Mg Tabs (Amlodipine Besylate) .Marland Kitchen.. 1 Tablet By Mouth Once A Day 2)  Hydrochlorothiazide 25 Mg Tabs (Hydrochlorothiazide) .Marland Kitchen.. 1 Tablet By Mouth Once A Day 3)  Omeprazole 20 Mg Cpdr (Omeprazole) .... Take 2 Capsule By Mouth Once A Day 4)  Lisinopril 40 Mg Tabs (Lisinopril) .... Take 1 Tablet By Mouth Once A Day 5)  Ecotrin Low Strength 81 Mg  Tbec (Aspirin) .Marland Kitchen.. 1 By Mouth Qd 6)  Claritin 10 Mg  Tabs (Loratadine) .Marland Kitchen.. 1po Once Daily Prn 7)  Metformin Hcl 500 Mg  Tabs (Metformin Hcl) .... 2 By Mouth Once Daily 8)  Pravachol 40 Mg  Tabs (Pravastatin Sodium) .Marland Kitchen.. 1po Once Daily 9)  Temazepam 30 Mg Caps (Temazepam) .Marland Kitchen.. 1po At Bedtime As Needed  Current Medications (verified): 1)  Amlodipine Besylate 10 Mg Tabs (Amlodipine Besylate) .Marland Kitchen.. 1 Tablet By Mouth Once A Day 2)  Hydrochlorothiazide 25 Mg Tabs (Hydrochlorothiazide) .Marland Kitchen.. 1 Tablet By Mouth Once A Day 3)  Omeprazole 20 Mg Cpdr (Omeprazole) .... Take 2 Capsule By Mouth Once A Day 4)  Lisinopril 40  Mg Tabs (Lisinopril) .... Take 1 Tablet By Mouth Once A Day 5)  Ecotrin Low Strength 81 Mg  Tbec (Aspirin) .Marland Kitchen.. 1 By Mouth Qd 6)  Claritin 10 Mg  Tabs (Loratadine) .Marland Kitchen.. 1po Once Daily Prn 7)  Metformin Hcl 500 Mg  Tabs (Metformin Hcl) .... 2 By Mouth Once Daily 8)  Pravachol 40 Mg  Tabs (Pravastatin Sodium) .Marland Kitchen.. 1po Once Daily 9)  Temazepam 30 Mg Caps (Temazepam) .Marland Kitchen.. 1po At Bedtime As Needed  Allergies (verified): 1)  ! Ibuprofen  Past History:  Past Medical History: Last updated: 06/24/2007 Diabetes mellitus, type II Hyperlipidemia Hypertension Peptic ulcer disease Heart Murmur Depression GERD Colonic polyps, hx of - adenomatous left knee DJD Allergic rhinitis  Past Surgical  History: Last updated: 10/05/2006 bx breast Appendectomy Tubal ligation Tonsillectomy  Family History: Last updated: 06/24/2007 ETOH DJD elev cholesterol heart disease stroke HTN depression DM mother with ENT cancer  Social History: Last updated: 12/07/2009 Married Former Smoker Alcohol use-no 1 child retired - [employer], [employer] Drug use-no  Risk Factors: Smoking Status: quit (06/24/2007)  Social History: Married Former Smoker Alcohol use-no 1 child retired - [employer], 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         all otherwise negative per pt -    Physical Exam  General:  alert and overweight-appearing.   Head:  normocephalic and atraumatic.   Eyes:  vision grossly intact, pupils equal, and pupils round.   Ears:  R ear normal and L ear normal.   Nose:  no external deformity and no nasal discharge.   Mouth:  no gingival abnormalities and pharynx pink and moist.   Neck:  supple and no masses.   Lungs:  normal respiratory effort and normal breath sounds.   Heart:  normal rate and regular rhythm.   Abdomen:  soft, non-tender, and normal bowel sounds.   Msk:  no joint tenderness and no joint swelling.  , no spine or paravertebral tender, swelling or rash Extremities:  no edema, no erythema  Neurologic:  cranial nerves II-XII intact and strength normal in all extremities.  gait normal and DTRs symmetrical and normal.   Skin:  color normal and no rashes.   Psych:  not depressed appearing and slightly anxious.      Impression & Recommendations:  Problem # 1:  Preventive Health Care (ICD-V70.0) Overall doing well, age appropriate education and counseling updated, referral for preventive services and immunizations addressed, dietary counseling and smoking status adressed , most recent labs reviewed, ecg reviewed I have personally reviewed and have noted 1.The patient's medical and social history 2.Their use of alcohol, tobacco or illicit drugs 3.Their current medications and supplements 4. Functional ability including ADL's, fall risk, home safety risk, hearing & visual impairment  5.Diet and physical activities 6.Evidence for depression or mood disorders The patients weight, height, BMI  have been recorded in the chart I have made referrals, counseling and provided education to the patient based review of the above  Orders: EKG w/ Interpretation (93000) TLB-BMP (Basic Metabolic Panel-BMET) (80048-METABOL) TLB-CBC Platelet - w/Differential (85025-CBCD) TLB-Hepatic/Liver Function Pnl (80076-HEPATIC) TLB-Lipid Panel (80061-LIPID) TLB-TSH (Thyroid Stimulating Hormone) (84443-TSH) TLB-Udip ONLY (81003-UDIP)  Problem #  2:  DIABETES MELLITUS, TYPE II (ICD-250.00)  Her updated medication list for this problem includes:    Lisinopril 40 Mg Tabs (Lisinopril) .Marland Kitchen... Take 1 tablet by mouth once a day    Ecotrin Low Strength 81 Mg Tbec (Aspirin) .Marland Kitchen... 1 by mouth qd    Metformin Hcl 500 Mg Tabs (Metformin hcl) .Marland Kitchen... 2 by mouth once daily  Orders: TLB-A1C / Hgb A1C (Glycohemoglobin) (83036-A1C) TLB-Microalbumin/Creat Ratio, Urine (82043-MALB)  Labs Reviewed: Creat: 0.9 (09/24/2008)    Reviewed HgBA1c results: 7.5 (09/24/2008)  7.2 (06/24/2007) stable overall by hx and exam, ok to continue meds/tx as is   Problem # 3:  HYPERTENSION (ICD-401.9)  Her updated medication list for this problem includes:    Amlodipine Besylate 10 Mg Tabs (Amlodipine besylate) .Marland Kitchen... 1 tablet by mouth once a day     Hydrochlorothiazide 25 Mg Tabs (Hydrochlorothiazide) .Marland Kitchen... 1 tablet by mouth once a day    Lisinopril 40 Mg Tabs (Lisinopril) .Marland Kitchen... Take 1 tablet by mouth once a day  BP today: 100/62 Prior BP: 120/64 (09/24/2008)  Labs Reviewed: K+: 4.0 (09/24/2008) Creat: : 0.9 (09/24/2008)   Chol: 113 (09/24/2008)   HDL: 32.90 (09/24/2008)   LDL: 66 (09/24/2008)   TG: 70.0 (09/24/2008) stable overall by hx and exam, ok to continue meds/tx as is   Complete Medication List: 1)  Amlodipine Besylate 10 Mg Tabs (Amlodipine besylate) .Marland Kitchen.. 1 tablet by mouth once a day 2)  Hydrochlorothiazide 25 Mg Tabs (Hydrochlorothiazide) .Marland Kitchen.. 1 tablet by mouth once a day 3)  Omeprazole 20 Mg Cpdr (Omeprazole) .... Take 2 capsule by mouth once a day 4)  Lisinopril 40 Mg Tabs (Lisinopril) .... Take 1 tablet by mouth once a day 5)  Ecotrin Low Strength 81 Mg Tbec (Aspirin) .Marland Kitchen.. 1 by mouth qd 6)  Claritin 10 Mg Tabs (Loratadine) .Marland Kitchen.. 1po once daily prn 7)  Metformin Hcl 500 Mg Tabs (Metformin hcl) .... 2 by mouth once daily 8)  Pravachol 40 Mg Tabs (Pravastatin sodium) .Marland Kitchen.. 1po once daily 9)  Temazepam 30 Mg Caps (Temazepam) .Marland Kitchen.. 1po at bedtime as needed  Other Orders: Flu Vaccine 58yrs + MEDICARE PATIENTS (Z6109) Administration Flu vaccine - MCR (U0454) T-Bone Densitometry (09811) Gastroenterology Referral (GI)  Patient Instructions: 1)  you had the flu shot today 2)  please schedule the bone density before leaving today 3)  Please go to the Lab in the basement for your blood and/or urine tests today 4)  Please call the number on the [facility] Card for results of your testing  5)  Please call for your yearly mammogram 6)  You will be contacted about the referral(s) to: colonoscopy 7)  Please schedule a follow-up appointment in 6 months with: 8)  BMP prior to visit, ICD-9: 250.02 9)  Lipid Panel prior to visit, ICD-9: 10)  HbgA1C prior to visit, ICD-9: Prescriptions: TEMAZEPAM 30 MG CAPS (TEMAZEPAM) 1po at bedtime as  needed  #30 x 3   Entered and Authorized by:   Corwin Levins MD   Signed by:   Corwin Levins MD on 12/07/2009   Method used:   Print then Give to Patient   RxID:   [id number] PRAVACHOL 40 MG  TABS (PRAVASTATIN SODIUM) 1po once daily  #90 x 3   Entered and Authorized by:   Corwin Levins MD   Signed by:   Corwin Levins MD on 12/07/2009   Method used:   Print then Give to Patient   RxID:   [id number]  METFORMIN HCL 500 MG  TABS (METFORMIN HCL) 2 by mouth once daily  #180 x 3   Entered and Authorized by:   Corwin Levins MD   Signed by:   Corwin Levins MD on 12/07/2009   Method used:   Print then Give to Patient   RxID:   [id number] LISINOPRIL 40 MG TABS (LISINOPRIL) Take 1 tablet by mouth once a day  #90 x 3   Entered and Authorized by:   Corwin Levins MD   Signed by:   Corwin Levins MD on 12/07/2009   Method used:   Print then Give to Patient   RxID:   [id number] OMEPRAZOLE 20 MG CPDR (OMEPRAZOLE) Take 2 capsule by mouth once a day  #180 x 3   Entered and Authorized by:   Corwin Levins MD   Signed by:   Corwin Levins MD on 12/07/2009   Method used:   Print then Give to Patient   RxID:   [id number] HYDROCHLOROTHIAZIDE 25 MG TABS (HYDROCHLOROTHIAZIDE) 1 tablet by mouth once a day  #90 x 3   Entered and Authorized by:   Corwin Levins MD   Signed by:   Corwin Levins MD on 12/07/2009   Method used:   Print then Give to Patient   RxID:   [id number] AMLODIPINE BESYLATE 10 MG TABS (AMLODIPINE BESYLATE) 1 tablet by mouth once a day  #90 x 3   Entered and Authorized by:   Corwin Levins MD   Signed by:   Corwin Levins MD on 12/07/2009   Method used:   Print then Give to Patient   RxID:   [id number]    Orders Added: 1)  Flu Vaccine 55yrs + MEDICARE PATIENTS [Q2039] 2)  Administration Flu vaccine - MCR [G0008] 3)  EKG w/ Interpretation [93000] 4)  T-Bone Densitometry [77080] 5)  TLB-BMP (Basic Metabolic Panel-BMET) [80048-METABOL] 6)  TLB-CBC Platelet -  w/Differential [85025-CBCD] 7)  TLB-Hepatic/Liver Function Pnl [80076-HEPATIC] 8)  TLB-Lipid Panel [80061-LIPID] 9)  TLB-TSH (Thyroid Stimulating Hormone) [84443-TSH] 10)  TLB-Udip ONLY [81003-UDIP] 11)  TLB-A1C / Hgb A1C (Glycohemoglobin) [83036-A1C] 12)  TLB-Microalbumin/Creat Ratio, Urine [82043-MALB] 13)  Gastroenterology Referral [GI] 14)  Est. Patient 65& > [25427]  Flu Vaccine Consent Questions     Do you have a history of severe allergic reactions to this vaccine? no    Any prior history of allergic reactions to egg and/or gelatin? no    Do you have a sensitivity to the preservative Thimersol? no    Do you have a past history of Guillan-Barre Syndrome? no    Do you currently have an acute febrile illness? no    Have you ever had a severe reaction to latex? no    Vaccine information given and explained to patient? yes    Are you currently pregnant? no    Lot Number:[id number]   Exp Date:08/06/2010   Site Given  Left Deltoid IM        .lbmedflu1

## 2010-03-08 NOTE — Assessment & Plan Note (Signed)
Summary: flu / Mary Mccarthy / cd  Nurse Visit   Allergies: 1)  ! Ibuprofen  Orders Added: 1)  Flu Vaccine 63yrs + [90658] 2)  Administration Flu vaccine - MCR [G0008] 3)  Est. Patient Level I [81191] Flu Vaccine Consent Questions     Do you have a history of severe allergic reactions to this vaccine? no    Any prior history of allergic reactions to egg and/or gelatin? no    Do you have a sensitivity to the preservative Thimersol? no    Do you have a past history of Guillan-Barre Syndrome? no    Do you currently have an acute febrile illness? no    Have you ever had a severe reaction to latex? no    Vaccine information given and explained to patient? yes    Are you currently pregnant? no    Lot Number:[id number]   Exp Date:08/05/2009   Site Given  Left Deltoid IMistration Flu vaccine - MCR [G0008] .lbmedflu

## 2010-03-08 NOTE — Progress Notes (Signed)
Summary: Prep Questions  Phone Note Call from Patient Call back at Home Phone [phone]   Caller: Patient Call For: Dr. Jarold Motto Reason for Call: Talk to Nurse Summary of Call: pt would like to speak with nurse, spoke with JO from previst about prep and was still not happy with results, encouraged to speak with assistant to see if there was anything further we could do or if she needs to cancel appt Initial call taken by: Raechel Chute,  December 24, 2009 4:19 PM  Follow-up for Phone Call        Spoke with patient, told her to come by 3rd floor and pick up the movi prep for her procedure that we got her prep to use so she does not need to buy the prep. She was very pleased and understands this. She will come mon or tue. next week. Follow-up by: Sherren Kerns RN,  December 24, 2009 5:40 PM

## 2010-04-12 ENCOUNTER — Telehealth: Payer: Self-pay | Admitting: Internal Medicine

## 2010-04-19 LAB — GLUCOSE, CAPILLARY
Glucose-Capillary: 132 mg/dL — ABNORMAL HIGH (ref 70–99)
Glucose-Capillary: 135 mg/dL — ABNORMAL HIGH (ref 70–99)

## 2010-04-19 NOTE — Progress Notes (Signed)
Summary: ALT med  Phone Note Call from Patient Call back at Home Phone [phone]   Caller: Spouse Summary of Call: Spouse called stating that Rx for Temezapam is too expensive, $40 per month for him Truitt Merle) and pt. Spouse is requesting alternative medication. Initial call taken by: Margaret Pyle, CMA,  April 12, 2010 1:51 PM  Follow-up for Phone Call        ok to change to [facility] as needed  - done hardcopy to LIM side B - dahlia  Follow-up by: Corwin Levins MD,  April 12, 2010 5:33 PM  Additional Follow-up for Phone Call Additional follow up Details #1::        Rx faxed to [facility] [address] per spouse req Additional Follow-up by: Margaret Pyle, CMA,  April 13, 2010 8:02 AM    New/Updated Medications: ZOLPIDEM TARTRATE 12.5 MG CR-TABS (ZOLPIDEM TARTRATE) 1 by mouth at bedtime as needed Prescriptions: ZOLPIDEM TARTRATE 12.5 MG CR-TABS (ZOLPIDEM TARTRATE) 1 by mouth at bedtime as needed  #30 x 2   Entered and Authorized by:   Corwin Levins MD   Signed by:   Corwin Levins MD on 04/12/2010   Method used:   Print then Give to Patient   RxID:   [id number]  done hardcopy to LIM side B - dahlia  Corwin Levins MD  April 12, 2010 5:34 PM

## 2010-04-23 LAB — HM MAMMOGRAPHY: HM Mammogram: NEGATIVE

## 2010-06-19 ENCOUNTER — Encounter: Payer: Self-pay | Admitting: Internal Medicine

## 2010-06-19 DIAGNOSIS — Z0001 Encounter for general adult medical examination with abnormal findings: Secondary | ICD-10-CM | POA: Insufficient documentation

## 2010-06-20 ENCOUNTER — Encounter: Payer: Self-pay | Admitting: Internal Medicine

## 2010-06-20 ENCOUNTER — Ambulatory Visit (INDEPENDENT_AMBULATORY_CARE_PROVIDER_SITE_OTHER): Payer: No Typology Code available for payment source | Admitting: Internal Medicine

## 2010-06-20 ENCOUNTER — Other Ambulatory Visit (INDEPENDENT_AMBULATORY_CARE_PROVIDER_SITE_OTHER): Payer: No Typology Code available for payment source

## 2010-06-20 VITALS — BP 122/72 | HR 78 | Temp 98.1°F | Ht 61.0 in | Wt 230.0 lb

## 2010-06-20 DIAGNOSIS — M722 Plantar fascial fibromatosis: Secondary | ICD-10-CM

## 2010-06-20 DIAGNOSIS — I10 Essential (primary) hypertension: Secondary | ICD-10-CM

## 2010-06-20 DIAGNOSIS — E119 Type 2 diabetes mellitus without complications: Secondary | ICD-10-CM

## 2010-06-20 DIAGNOSIS — F329 Major depressive disorder, single episode, unspecified: Secondary | ICD-10-CM

## 2010-06-20 DIAGNOSIS — F3289 Other specified depressive episodes: Secondary | ICD-10-CM

## 2010-06-20 DIAGNOSIS — Z0000 Encounter for general adult medical examination without abnormal findings: Secondary | ICD-10-CM

## 2010-06-20 DIAGNOSIS — E785 Hyperlipidemia, unspecified: Secondary | ICD-10-CM

## 2010-06-20 DIAGNOSIS — G4700 Insomnia, unspecified: Secondary | ICD-10-CM

## 2010-06-20 LAB — LIPID PANEL
Cholesterol: 113 mg/dL (ref 0–200)
HDL: 34.4 mg/dL — ABNORMAL LOW (ref >39.00)
LDL Cholesterol: 64 mg/dL (ref 0–99)
Total CHOL/HDL Ratio: 3
Triglycerides: 73 mg/dL (ref 0.0–149.0)
VLDL: 14.6 mg/dL (ref 0.0–40.0)

## 2010-06-20 LAB — BASIC METABOLIC PANEL
BUN: 24 mg/dL — ABNORMAL HIGH (ref 6–23)
CO2: 29 mEq/L (ref 19–32)
Calcium: 9.9 mg/dL (ref 8.4–10.5)
Chloride: 99 mEq/L (ref 96–112)
Creatinine, Ser: 0.9 mg/dL (ref 0.4–1.2)
GFR: 69.93 mL/min (ref >60.00)
Glucose, Bld: 97 mg/dL (ref 70–99)
Potassium: 4.5 mEq/L (ref 3.5–5.1)
Sodium: 135 mEq/L (ref 135–145)

## 2010-06-20 LAB — HEMOGLOBIN A1C: Hgb A1c MFr Bld: 6.6 % — ABNORMAL HIGH (ref 4.6–6.5)

## 2010-06-20 MED ORDER — GLIMEPIRIDE 2 MG PO TABS: 2.0000 mg | ORAL_TABLET | Freq: Every day | ORAL | Status: DC

## 2010-06-20 MED ORDER — ZOLPIDEM TARTRATE 5 MG PO TABS: 5.0000 mg | ORAL_TABLET | Freq: Every evening | ORAL | Status: DC | PRN

## 2010-06-20 NOTE — Assessment & Plan Note (Addendum)
Mod to severe, ok for podiatry referra but she declines at this time, Continue all other medications as before, needs soft soled shoes

## 2010-06-20 NOTE — Assessment & Plan Note (Signed)
Mild, pt to call for tx if she requests

## 2010-06-20 NOTE — Assessment & Plan Note (Signed)
Ok to change to the ambien prn ,  to f/u any worsening symptoms or concerns

## 2010-06-20 NOTE — Progress Notes (Signed)
Subjective:    Patient ID: Mary Mccarthy, female    DOB: [birth year], 70 y.o.   MRN: [id number]  HPI  Here to f/u; overall doing ok,  Pt denies chest pain, increased sob or doe, wheezing, orthopnea, PND, increased LE swelling, palpitations, dizziness or syncope.  Pt denies new neurological symptoms such as new headache, or facial or extremity weakness or numbness   Pt denies polydipsia, polyuria, or low sugar symptoms such as weakness or confusion improved with po intake.  Pt states overall good compliance with meds, trying to follow lower cholesterol, diabetic diet, wt overall stable but little exercise however.  Has only been taking 2 mg amaryl in the AM instead of the recommended 3 mg (? Communication issue). Still having trouble with sleeping, adn for some reason does not know we had changed her temaepam to [place] in mar 6, never picked up the rx.  Also with pain burning and sharp sometimes to the left hel , worse to stand and walk and worse first thing in the am  .sister ill with heart disease and makes her feel depressed, but nonsuidical and does not want tx Past Medical History  Diagnosis Date  . Left knee DJD   . ALLERGIC RHINITIS 06/24/2007  . DEPRESSION 10/08/2006  . DIABETES MELLITUS, TYPE II 10/05/2006  . GERD 06/24/2007  . HYPERLIPIDEMIA 10/08/2006  . HYPERTENSION 10/08/2006  . PEPTIC ULCER DISEASE 10/08/2006  . COLONIC POLYPS, HX OF 06/24/2007  . INSOMNIA 09/24/2008   Past Surgical History  Procedure Date  . Breast biopsy   . Appendectomy   . Tubal ligation   . Tonsillectomy     reports that she has quit smoking. She does not have any smokeless tobacco history on file. She reports that she does not drink alcohol or use illicit drugs. family history includes Alcohol abuse in her other; Arthritis in her other; Cancer in her mother; Depression in her other; Diabetes in her other; Heart disease in her other; Hyperlipidemia in her other; Hypertension in her other; and Stroke in her  other. Allergies  Allergen Reactions  . Ibuprofen    Current Outpatient Prescriptions on File Prior to Visit  Medication Sig Dispense Refill  . amLODipine (NORVASC) 10 MG tablet Take 10 mg by mouth daily.        Marland Kitchen aspirin 81 MG EC tablet Take 81 mg by mouth daily.        Marland Kitchen glimepiride (AMARYL) 2 MG tablet Take 2 mg by mouth. 1 and 1/2 by mouth every morning       . hydrochlorothiazide 25 MG tablet Take 25 mg by mouth daily.        Marland Kitchen lisinopril (PRINIVIL,ZESTRIL) 40 MG tablet Take 40 mg by mouth daily.        Marland Kitchen loratadine (CLARITIN) 10 MG tablet Take 10 mg by mouth daily as needed.        . metFORMIN (GLUCOPHAGE) 1000 MG tablet Take 1,000 mg by mouth 2 (two) times daily.        Marland Kitchen omeprazole (PRILOSEC) 20 MG capsule Take 20 mg by mouth 2 (two) times daily.        . pravastatin (PRAVACHOL) 40 MG tablet Take 40 mg by mouth daily.        . temazepam (RESTORIL) 30 MG capsule Take 30 mg by mouth at bedtime as needed.         Review of Systems Review of Systems  Constitutional: Negative for diaphoresis and unexpected weight change.  HENT:  Negative for drooling and tinnitus.   Eyes: Negative for photophobia and visual disturbance.  Respiratory: Negative for choking and stridor.   Gastrointestinal: Negative for vomiting and blood in stool.  Genitourinary: Negative for hematuria and decreased urine volume.  Musculoskeletal: Negative for gait problem.  Skin: Negative for color change and wound.  Neurological: Negative for tremors and numbness.  Psychiatric/Behavioral: Negative for decreased concentration. The patient is not hyperactive.       Objective:   Physical Exam BP 122/72  Pulse 78  Temp(Src) 98.1 F (36.7 C) (Oral)  Ht 5\' 1"  (1.549 m)  Wt 230 lb (104.327 kg)  BMI 43.46 kg/m2  SpO2 95% Physical Exam  VS noted Constitutional: Pt appears well-developed and well-nourished.  HENT: Head: Normocephalic.  Right Ear: External ear normal.  Left Ear: External ear normal.  Eyes:  Conjunctivae and EOM are normal. Pupils are equal, round, and reactive to light.  Neck: Normal range of motion. Neck supple.  Cardiovascular: Normal rate and regular rhythm.   Pulmonary/Chest: Effort normal and breath sounds normal.  Abd:  Soft, NT, non-distended, + BS Neurological: Pt is alert. No cranial nerve deficit.  Skin: Skin is warm. No erythema.  Psychiatric: Pt behavior is normal. Thought content normal. 1+ nervous, and depressed affect        Assessment & Plan:

## 2010-06-20 NOTE — Patient Instructions (Addendum)
Take all new medications as prescribed - the ambien for sleep Continue all other medications as before Please go to LAB in the Basement for the blood and/or urine tests to be done today Please call the phone number [phone] (the PhoneTree System) for results of testing in 2-3 days;  When calling, simply dial the number, and when prompted enter the MRN number above (the Medical Record Number) and the # key, then the message should start. Please call if you feel you need medication for depression, or the referral to the foot doctor (podiatry) Please return in 6 mo with Lab testing done 3-5 days before

## 2010-06-20 NOTE — Assessment & Plan Note (Signed)
stable overall by hx and exam, most recent lab reviewed with pt, and pt to continue medical treatment as before  Lab Results  Component Value Date   HGBA1C 7.7* 01/21/2010

## 2010-06-20 NOTE — Assessment & Plan Note (Signed)
stable overall by hx and exam, most recent lab reviewed with pt, and pt to continue medical treatment as before  Lab Results  Component Value Date   LDLCALC 63 12/07/2009

## 2010-06-20 NOTE — Progress Notes (Signed)
Quick Note:  Voice message left on PhoneTree system - lab is negative, normal or otherwise stable, pt to continue same tx ______ 

## 2010-06-20 NOTE — Assessment & Plan Note (Signed)
stable overall by hx and exam, most recent lab reviewed with pt, and pt to continue medical treatment as before  Lab Results  Component Value Date   WBC 8.3 12/07/2009   HGB 14.3 12/07/2009   HCT 41.5 12/07/2009   PLT 215.0 12/07/2009   CHOL 109 12/07/2009   TRIG 80.0 12/07/2009   HDL 29.80* 12/07/2009   ALT 59* 12/07/2009   AST 61* 12/07/2009   NA 139 01/21/2010   K 4.4 01/21/2010   CL 103 01/21/2010   CREATININE 0.7 01/21/2010   BUN 17 01/21/2010   CO2 28 01/21/2010   TSH 3.58 12/07/2009   HGBA1C 7.7* 01/21/2010   MICROALBUR 3.5* 12/07/2009   BP Readings from Last 3 Encounters:  06/20/10 122/72  12/07/09 100/62  09/24/08 120/64

## 2010-08-24 ENCOUNTER — Other Ambulatory Visit: Payer: Self-pay | Admitting: Internal Medicine

## 2010-08-26 ENCOUNTER — Other Ambulatory Visit: Payer: Self-pay | Admitting: Internal Medicine

## 2010-08-27 ENCOUNTER — Other Ambulatory Visit: Payer: Self-pay | Admitting: Internal Medicine

## 2010-08-29 ENCOUNTER — Telehealth: Payer: Self-pay

## 2010-08-29 MED ORDER — TEMAZEPAM 30 MG PO CAPS: 30.0000 mg | ORAL_CAPSULE | Freq: Every evening | ORAL | Status: AC | PRN

## 2010-08-29 NOTE — Telephone Encounter (Signed)
Pt called requesting a refill of Temazepam. Pt was previously on this medication (#30 x 3 written 12/07/2009) but was switched to Ambien. Pt states that Ambien is not as effective as Temazepam and she would like to switch back.

## 2010-08-29 NOTE — Telephone Encounter (Signed)
Done per emr 

## 2010-08-30 NOTE — Telephone Encounter (Signed)
Rx faxed to pharmacy, pt informed.  

## 2010-12-21 ENCOUNTER — Ambulatory Visit (INDEPENDENT_AMBULATORY_CARE_PROVIDER_SITE_OTHER): Payer: No Typology Code available for payment source | Admitting: Internal Medicine

## 2010-12-21 ENCOUNTER — Other Ambulatory Visit (INDEPENDENT_AMBULATORY_CARE_PROVIDER_SITE_OTHER): Payer: No Typology Code available for payment source

## 2010-12-21 ENCOUNTER — Encounter: Payer: Self-pay | Admitting: Internal Medicine

## 2010-12-21 VITALS — BP 118/80 | HR 80 | Temp 98.3°F | Ht 61.0 in | Wt 234.0 lb

## 2010-12-21 DIAGNOSIS — Z23 Encounter for immunization: Secondary | ICD-10-CM

## 2010-12-21 DIAGNOSIS — E119 Type 2 diabetes mellitus without complications: Secondary | ICD-10-CM

## 2010-12-21 DIAGNOSIS — Z0000 Encounter for general adult medical examination without abnormal findings: Secondary | ICD-10-CM

## 2010-12-21 LAB — URINALYSIS, ROUTINE W REFLEX MICROSCOPIC
Hgb urine dipstick: NEGATIVE
Total Protein, Urine: NEGATIVE
Urine Glucose: NEGATIVE
pH: 5.5 (ref 5.0–8.0)

## 2010-12-21 LAB — CBC WITH DIFFERENTIAL/PLATELET
Basophils Relative: 0.4 % (ref 0.0–3.0)
Eosinophils Absolute: 0.1 10*3/uL (ref 0.0–0.7)
Eosinophils Relative: 1.6 % (ref 0.0–5.0)
Hemoglobin: 13.4 g/dL (ref 12.0–15.0)
Lymphocytes Relative: 32.1 % (ref 12.0–46.0)
MCHC: 34.1 g/dL (ref 30.0–36.0)
MCV: 96.8 fl (ref 78.0–100.0)
Monocytes Absolute: 0.6 10*3/uL (ref 0.1–1.0)
Neutro Abs: 3.5 10*3/uL (ref 1.4–7.7)
RBC: 4.06 Mil/uL (ref 3.87–5.11)
WBC: 6.2 10*3/uL (ref 4.5–10.5)

## 2010-12-21 LAB — BASIC METABOLIC PANEL
Calcium: 10 mg/dL (ref 8.4–10.5)
Creatinine, Ser: 0.8 mg/dL (ref 0.4–1.2)

## 2010-12-21 LAB — LIPID PANEL
Cholesterol: 99 mg/dL (ref 0–200)
HDL: 34.9 mg/dL — ABNORMAL LOW (ref >39.00)
Triglycerides: 73 mg/dL (ref 0.0–149.0)

## 2010-12-21 LAB — HEPATIC FUNCTION PANEL
Bilirubin, Direct: 0.2 mg/dL (ref 0.0–0.3)
Total Bilirubin: 1.1 mg/dL (ref 0.3–1.2)

## 2010-12-21 LAB — HEMOGLOBIN A1C: Hgb A1c MFr Bld: 8.3 % — ABNORMAL HIGH (ref 4.6–6.5)

## 2010-12-21 NOTE — Patient Instructions (Signed)
You had the flu shot today Continue all other medications as before Please go to LAB in the Basement for the blood and/or urine tests to be done today Please call the phone number [phone] (the PhoneTree System) for results of testing in 2-3 days;  When calling, simply dial the number, and when prompted enter the MRN number above (the Medical Record Number) and the # key, then the message should start.  

## 2010-12-21 NOTE — Assessment & Plan Note (Signed)
Overall doing well, age appropriate education and counseling updated, referrals for preventative services and immunizations addressed, dietary and smoking counseling addressed, most recent labs and ECG reviewed.  I have personally reviewed and have noted: 1) the patient's medical and social history 2) The pt's use of alcohol, tobacco, and illicit drugs 3) The patient's current medications and supplements 4) Functional ability including ADL's, fall risk, home safety risk, hearing and visual impairment 5) Diet and physical activities 6) Evidence for depression or mood disorder 7) The patient's height, weight, and BMI have been recorded in the chart I have made referrals, and provided counseling and education based on review of the above For labs today, otherwise up to date

## 2010-12-21 NOTE — Progress Notes (Signed)
Subjective:    Patient ID: Mary Mccarthy, female    DOB: [birth year], 70 y.o.   MRN: [id number]  HPI  Here for wellness and f/u;  Overall doing ok;  Pt denies CP, worsening SOB, DOE, wheezing, orthopnea, PND, worsening LE edema, palpitations, dizziness or syncope.  Pt denies neurological change such as new Headache, facial or extremity weakness.  Pt denies polydipsia, polyuria, or low sugar symptoms. Pt states overall good compliance with treatment and medications, good tolerability, and trying to follow lower cholesterol diet.  Pt denies worsening depressive symptoms, suicidal ideation or panic. No fever, wt loss, night sweats, loss of appetite, or other constitutional symptoms.  Pt states good ability with ADL's, low fall risk, home safety reviewed and adequate, no significant changes in hearing or vision, and occasionally active with exercise. No acute complaiinyts.  C/o achy joints  - knees mostly, but no effusion except for some mild swelling just this past wk, no giveaway Past Medical History  Diagnosis Date  . Left knee DJD   . ALLERGIC RHINITIS 06/24/2007  . DEPRESSION 10/08/2006  . DIABETES MELLITUS, TYPE II 10/05/2006  . GERD 06/24/2007  . HYPERLIPIDEMIA 10/08/2006  . HYPERTENSION 10/08/2006  . PEPTIC ULCER DISEASE 10/08/2006  . COLONIC POLYPS, HX OF 06/24/2007  . INSOMNIA 09/24/2008   Past Surgical History  Procedure Date  . Breast biopsy   . Appendectomy   . Tubal ligation   . Tonsillectomy     reports that she has quit smoking. She does not have any smokeless tobacco history on file. She reports that she does not drink alcohol or use illicit drugs. family history includes Alcohol abuse in her other; Arthritis in her other; Cancer in her mother; Depression in her other; Diabetes in her other; Heart disease in her other; Hyperlipidemia in her other; Hypertension in her other; and Stroke in her other. Allergies  Allergen Reactions  . Ibuprofen    Current Outpatient Prescriptions on File  Prior to Visit  Medication Sig Dispense Refill  . amLODipine (NORVASC) 10 MG tablet Take 10 mg by mouth daily.        Marland Kitchen aspirin 81 MG EC tablet Take 81 mg by mouth daily.        Marland Kitchen glimepiride (AMARYL) 2 MG tablet Take 1 tablet (2 mg total) by mouth daily before breakfast.  90 tablet  3  . hydrochlorothiazide 25 MG tablet Take 25 mg by mouth daily.        Marland Kitchen lisinopril (PRINIVIL,ZESTRIL) 40 MG tablet Take 40 mg by mouth daily.        . metFORMIN (GLUCOPHAGE) 1000 MG tablet Take 1,000 mg by mouth 2 (two) times daily.        Marland Kitchen omeprazole (PRILOSEC) 20 MG capsule Take 20 mg by mouth 2 (two) times daily.        . pravastatin (PRAVACHOL) 40 MG tablet Take 40 mg by mouth daily.         Review of Systems Review of Systems  Constitutional: Negative for diaphoresis, activity change, appetite change and unexpected weight change.  HENT: Negative for hearing loss, ear pain, facial swelling, mouth sores and neck stiffness.   Eyes: Negative for pain, redness and visual disturbance.  Respiratory: Negative for shortness of breath and wheezing.   Cardiovascular: Negative for chest pain and palpitations.  Gastrointestinal: Negative for diarrhea, blood in stool, abdominal distention and rectal pain.  Genitourinary: Negative for hematuria, flank pain and decreased urine volume.  Musculoskeletal: Negative for myalgias and joint  swelling.  Skin: Negative for color change and wound.  Neurological: Negative for syncope and numbness.  Hematological: Negative for adenopathy.  Psychiatric/Behavioral: Negative for hallucinations, self-injury, decreased concentration and agitation.      Objective:   Physical Exam BP 118/80  Pulse 80  Temp(Src) 98.3 F (36.8 C) (Oral)  Ht 5\' 1"  (1.549 m)  Wt 234 lb (106.142 kg)  BMI 44.21 kg/m2  SpO2 97% Physical Exam  VS noted Constitutional: Pt is oriented to person, place, and time. Appears well-developed and well-nourished.  HENT:  Head: Normocephalic and atraumatic.    Right Ear: External ear normal.  Left Ear: External ear normal.  Nose: Nose normal.  Mouth/Throat: Oropharynx is clear and moist.  Eyes: Conjunctivae and EOM are normal. Pupils are equal, round, and reactive to light.  Neck: Normal range of motion. Neck supple. No JVD present. No tracheal deviation present.  Cardiovascular: Normal rate, regular rhythm, normal heart sounds and intact distal pulses.   Pulmonary/Chest: Effort normal and breath sounds normal.  Abdominal: Soft. Bowel sounds are normal. There is no tenderness.  Musculoskeletal: Normal range of motion. Exhibits trace pedal edema bilat  Lymphadenopathy:  Has no cervical adenopathy.  Neurological: Pt is alert and oriented to person, place, and time. Pt has normal reflexes. No cranial nerve deficit.  Skin: Skin is warm and dry. No rash noted.  Psychiatric:  Has  normal mood and affect. Behavior is normal.     Assessment & Plan:

## 2010-12-21 NOTE — Assessment & Plan Note (Signed)
stable overall by hx and exam, most recent data reviewed with pt, and pt to continue medical treatment as before  Lab Results  Component Value Date   HGBA1C 6.6* 06/20/2010

## 2010-12-22 ENCOUNTER — Other Ambulatory Visit: Payer: Self-pay | Admitting: Internal Medicine

## 2010-12-22 LAB — MICROALBUMIN / CREATININE URINE RATIO: Microalb Creat Ratio: 0.4 mg/g (ref 0.0–30.0)

## 2010-12-22 MED ORDER — PIOGLITAZONE HCL 15 MG PO TABS: 15.0000 mg | ORAL_TABLET | Freq: Every day | ORAL | Status: DC

## 2010-12-25 ENCOUNTER — Other Ambulatory Visit: Payer: Self-pay | Admitting: Internal Medicine

## 2010-12-26 ENCOUNTER — Other Ambulatory Visit: Payer: Self-pay

## 2010-12-26 MED ORDER — OMEPRAZOLE 20 MG PO CPDR: 20.0000 mg | DELAYED_RELEASE_CAPSULE | Freq: Two times a day (BID) | ORAL | Status: DC

## 2010-12-26 MED ORDER — PRAVASTATIN SODIUM 40 MG PO TABS: 40.0000 mg | ORAL_TABLET | Freq: Every day | ORAL | Status: DC

## 2010-12-26 MED ORDER — GLIMEPIRIDE 2 MG PO TABS: 2.0000 mg | ORAL_TABLET | Freq: Every day | ORAL | Status: DC

## 2010-12-26 MED ORDER — HYDROCHLOROTHIAZIDE 25 MG PO TABS: 25.0000 mg | ORAL_TABLET | Freq: Every day | ORAL | Status: DC

## 2010-12-26 MED ORDER — AMLODIPINE BESYLATE 10 MG PO TABS: 10.0000 mg | ORAL_TABLET | Freq: Every day | ORAL | Status: DC

## 2011-01-03 ENCOUNTER — Other Ambulatory Visit: Payer: Self-pay | Admitting: Internal Medicine

## 2011-01-03 NOTE — Telephone Encounter (Signed)
Done hardcopy to robin  

## 2011-01-03 NOTE — Telephone Encounter (Signed)
temazepam (RESTORIL) 30 MG capsule [Pharmacy Med Name: TEMAZEPAM 30MG  CAP] TAKE ONE CAPSULE BY MOUTH EVERY DAY AT BEDTIME AS NEEDED FOR SLEEP Disp: 30 each R: 1 Start: 01/03/2011 Class: Normal  Ok to refill this medication?

## 2011-01-04 NOTE — Telephone Encounter (Signed)
Faxed hardcopy to pharmacy. 

## 2011-02-24 ENCOUNTER — Ambulatory Visit: Payer: No Typology Code available for payment source | Admitting: Internal Medicine

## 2011-03-06 ENCOUNTER — Encounter: Payer: Self-pay | Admitting: Internal Medicine

## 2011-03-06 ENCOUNTER — Ambulatory Visit (INDEPENDENT_AMBULATORY_CARE_PROVIDER_SITE_OTHER): Payer: Medicare Other | Admitting: Internal Medicine

## 2011-03-06 VITALS — BP 102/62 | HR 81 | Temp 97.1°F | Ht 61.0 in | Wt 233.1 lb

## 2011-03-06 DIAGNOSIS — I10 Essential (primary) hypertension: Secondary | ICD-10-CM

## 2011-03-06 DIAGNOSIS — E785 Hyperlipidemia, unspecified: Secondary | ICD-10-CM

## 2011-03-06 DIAGNOSIS — E119 Type 2 diabetes mellitus without complications: Secondary | ICD-10-CM

## 2011-03-06 DIAGNOSIS — G4700 Insomnia, unspecified: Secondary | ICD-10-CM

## 2011-03-06 MED ORDER — CLONAZEPAM 0.5 MG PO TABS: ORAL_TABLET | ORAL | Status: DC

## 2011-03-06 NOTE — Assessment & Plan Note (Addendum)
uncontrolled recent, tolerating current meds well;  O/w stable overall by hx and exam, most recent data reviewed with pt, and pt to continue medical treatment as before, f/u lab next visit Lab Results  Component Value Date   HGBA1C 8.3* 12/21/2010

## 2011-03-06 NOTE — Assessment & Plan Note (Signed)
stable overall by hx and exam, most recent data reviewed with pt, and pt to continue medical treatment as before / BP Readings from Last 3 Encounters:  03/06/11 102/62  12/21/10 118/80  06/20/10 122/72

## 2011-03-06 NOTE — Progress Notes (Signed)
Subjective:    Patient ID: Mary Mccarthy, female    DOB: Jun 04, 1940, 71 y.o.   MRN: [id number]  HPI  Here to f/u as her new Health Ins plan will not authorize payment for meds, etc unless she is seen today; overall doing ok,  Pt denies chest pain, increased sob or doe, wheezing, orthopnea, PND, increased LE swelling, palpitations, dizziness or syncope.  Pt denies new neurological symptoms such as new headache, or facial or extremity weakness or numbness   Pt denies polydipsia, polyuria, or low sugar symptoms such as weakness or confusion improved with po intake.  Pt states overall good compliance with meds, trying to follow lower cholesterol, diabetic diet but admits to some recent noncompliance as she has been spending time in hosp's to be near family;  wt overall stable but little exercise however.  Brother having CABG next wk.Denies worsening depressive symptoms, suicidal ideation, or panic, though has ongoing anxiety, not increased recently.  Temazepam requires PA apparently but will pay for klonopin. Past Medical History  Diagnosis Date  . Left knee DJD   . ALLERGIC RHINITIS 06/24/2007  . DEPRESSION 10/08/2006  . DIABETES MELLITUS, TYPE II 10/05/2006  . GERD 06/24/2007  . HYPERLIPIDEMIA 10/08/2006  . HYPERTENSION 10/08/2006  . PEPTIC ULCER DISEASE 10/08/2006  . COLONIC POLYPS, HX OF 06/24/2007  . INSOMNIA 09/24/2008   Past Surgical History  Procedure Date  . Breast biopsy   . Appendectomy   . Tubal ligation   . Tonsillectomy     reports that she has quit smoking. She does not have any smokeless tobacco history on file. She reports that she does not drink alcohol or use illicit drugs. family history includes Alcohol abuse in her other; Arthritis in her other; Cancer in her mother; Depression in her other; Diabetes in her other; Heart disease in her other; Hyperlipidemia in her other; Hypertension in her other; and Stroke in her other. Allergies  Allergen Reactions  . Ibuprofen    Current  Outpatient Prescriptions on File Prior to Visit  Medication Sig Dispense Refill  . amLODipine (NORVASC) 10 MG tablet Take 1 tablet (10 mg total) by mouth daily.  90 tablet  3  . aspirin 81 MG EC tablet Take 81 mg by mouth daily.        Marland Kitchen glimepiride (AMARYL) 2 MG tablet Take 1 tablet (2 mg total) by mouth daily before breakfast.  90 tablet  3  . hydrochlorothiazide (HYDRODIURIL) 25 MG tablet Take 1 tablet (25 mg total) by mouth daily.  90 tablet  3  . lisinopril (PRINIVIL,ZESTRIL) 40 MG tablet TAKE ONE TABLET BY MOUTH EVERY DAY  90 tablet  3  . metFORMIN (GLUCOPHAGE) 1000 MG tablet TAKE ONE TABLET BY MOUTH TWICE DAILY  180 tablet  3  . omeprazole (PRILOSEC) 20 MG capsule Take 1 capsule (20 mg total) by mouth 2 (two) times daily.  180 capsule  3  . pioglitazone (ACTOS) 15 MG tablet Take 1 tablet (15 mg total) by mouth daily.  90 tablet  3  . pravastatin (PRAVACHOL) 40 MG tablet Take 1 tablet (40 mg total) by mouth daily.  90 tablet  3  . temazepam (RESTORIL) 30 MG capsule TAKE ONE CAPSULE BY MOUTH EVERY DAY AT BEDTIME AS NEEDED FOR SLEEP  30 capsule  5   Review of Systems Review of Systems  Constitutional: Negative for diaphoresis and unexpected weight change.  HENT: Negative for drooling and tinnitus.   Eyes: Negative for photophobia and visual disturbance.  Respiratory: Negative for choking and stridor.   Gastrointestinal: Negative for vomiting and blood in stool.  Genitourinary: Negative for hematuria and decreased urine volume.  Musculoskeletal: Negative for gait problem.  Skin: Negative for color change and wound.  Neurological: Negative for tremors and numbness.  Psychiatric/Behavioral: Negative for decreased concentration. The patient is not hyperactive.       Objective:   Physical Exam BP 102/62  Pulse 81  Temp(Src) 97.1 F (36.2 C) (Oral)  Ht 5\' 1"  (1.549 m)  Wt 233 lb 2 oz (105.745 kg)  BMI 44.05 kg/m2  SpO2 94% Physical Exam  VS noted Constitutional: Pt appears  well-developed and well-nourished.  HENT: Head: Normocephalic.  Right Ear: External ear normal.  Left Ear: External ear normal.  Eyes: Conjunctivae and EOM are normal. Pupils are equal, round, and reactive to light.  Neck: Normal range of motion. Neck supple.  Cardiovascular: Normal rate and regular rhythm.   Pulmonary/Chest: Effort normal and breath sounds normal.  Abd:  Soft, NT, non-distended, + BS Neurological: Pt is alert. No cranial nerve deficit.  Skin: Skin is warm. No erythema.  Psychiatric: Pt behavior is normal. Thought content normal. 1+ nervous    Assessment & Plan:

## 2011-03-06 NOTE — Assessment & Plan Note (Signed)
stable overall by hx and exam, most recent data reviewed with pt, and pt to continue medical treatment as before  Lab Results  Component Value Date   LDLCALC 50 12/21/2010

## 2011-03-06 NOTE — Assessment & Plan Note (Signed)
Ok to change the temazepam to klonopin asd qhs.  to f/u any worsening symptoms or concerns

## 2011-03-06 NOTE — Patient Instructions (Addendum)
OK to finish the temazepam as you have Take all new medications as prescribed Continue all other medications as before Please return Jun 21, 2011  with Lab testing done 3-5 days before

## 2011-05-02 ENCOUNTER — Telehealth: Payer: Self-pay

## 2011-05-02 DIAGNOSIS — L600 Ingrowing nail: Secondary | ICD-10-CM

## 2011-05-02 NOTE — Telephone Encounter (Signed)
Patient called [facility] c/o ingrown toenail that she thought was getting better but is not. She is requesting MD advisement and if she needs to see him first.

## 2011-05-02 NOTE — Telephone Encounter (Signed)
Ok for referral to podiatry 

## 2011-05-03 ENCOUNTER — Encounter (HOSPITAL_COMMUNITY): Payer: Self-pay | Admitting: *Deleted

## 2011-05-03 ENCOUNTER — Telehealth: Payer: Self-pay

## 2011-05-03 ENCOUNTER — Emergency Department (HOSPITAL_COMMUNITY)
Admission: EM | Admit: 2011-05-03 | Discharge: 2011-05-03 | Disposition: A | Discharge status: Home / Self Care | Payer: Medicare Other | Source: Home / Self Care | Attending: Emergency Medicine | Admitting: Emergency Medicine

## 2011-05-03 DIAGNOSIS — L600 Ingrowing nail: Secondary | ICD-10-CM

## 2011-05-03 MED ORDER — CEPHALEXIN 500 MG PO CAPS: 500.0000 mg | ORAL_CAPSULE | Freq: Three times a day (TID) | ORAL | Status: AC

## 2011-05-03 NOTE — Telephone Encounter (Signed)
Called the patient and she has decided to go to UC. She would like the podiatrist referral canceled.

## 2011-05-03 NOTE — Telephone Encounter (Signed)
Please inform P[facility];s as I am not sure how to do this  thanks

## 2011-05-03 NOTE — ED Notes (Signed)
Pt  Has  Redness  Pus  /  painfull  l  Big  Toe     Symptoms  X  1  Week            denys  Any  Injury  It  Appears  As  If  It  May be  Infected

## 2011-05-03 NOTE — Telephone Encounter (Signed)
Referral has been canceled.

## 2011-05-03 NOTE — Telephone Encounter (Signed)
If no fever, I can refer to podiatry;  If fever she should either come today, or tomorrow  (podiatry referral done)

## 2011-05-03 NOTE — ED Provider Notes (Signed)
Chief Complaint  Patient presents with  . Toe Pain    History of Present Illness:   The patient is a 71 year old female with diabetes with a one-week history of a painful, ingrown left great toenail. She called her primary care physician and he told her to come here.  Review of Systems:  Other than noted above, the patient denies any of the following symptoms: Systemic:  No fever, chills, sweats, weight loss, or fatigue. ENT:  No nasal congestion, rhinorrhea, sore throat, swelling of lips, tongue or throat. Resp:  No cough, wheezing, or shortness of breath. Skin:  No rash, itching, nodules, or suspicious lesions.  PMFSH:  Past medical history, family history, social history, meds, and allergies were reviewed.  Physical Exam:   Vital signs:  BP 142/88  Pulse 78  Temp(Src) 98.6 F (37 C) (Oral)  Resp 16  SpO2 98% Gen:  Alert, oriented, in no distress. Skin:  Exam of the left great toe reveals the lateral nail fold is inflamed, the nail is ingrown, there is no granulation tissue or purulent drainage.  Procedure Note:  Verbal informed consent was obtained from the patient.  Risks and benefits were outlined with the patient.  Patient understands and accepts these risks.  Identity of the patient was confirmed verbally and by armband.    Procedure was performed as followed:  The toe was prepped with Betadine and alcohol, and anesthetized with a digital block with 5 mL of 2% Xylocaine without epinephrine. After satisfactory anesthesia was achieved, the lateral aspect of the great toenail was split down to the base, and the ingrown portion was avulsed. This was then cleansed with saline, antibiotic ointment was applied, and a sterile pressure dressing.  Patient tolerated the procedure well without any immediate complications.   Assessment:   Diagnoses that have been ruled out:  None  Diagnoses that are still under consideration:  None  Final diagnoses:  Ingrown toenail    Plan:   1.   The following meds were prescribed:   New Prescriptions   CEPHALEXIN (KEFLEX) 500 MG CAPSULE    Take 1 capsule (500 mg total) by mouth 3 (three) times daily.   2.  The patient was instructed in symptomatic care and handouts were given. 3.  The patient was told to return if becoming worse in any way, if no better in 3 or 4 days, and given some red flag symptoms that would indicate earlier return.     Reuben Likes, MD 05/03/11 2207

## 2011-05-03 NOTE — Discharge Instructions (Signed)
Infected Ingrown Toenail  An infected ingrown toenail occurs when the nail edge grows into the skin and bacteria invade the area. Symptoms include pain, tenderness, swelling, and pus drainage from the edge of the nail. Poorly fitting shoes, minor injuries, and improper cutting of the toenail may also contribute to the problem. You should cut your toenails squarely instead of rounding the edges. Do not cut them too short. Avoid tight or pointed toe shoes. Sometimes the ingrown portion of the nail must be removed. If your toenail is removed, it can take 3-4 months for it to re-grow.  HOME CARE INSTRUCTIONS     Soak your infected toe in warm water for 20-30 minutes, 2 to 3 times a day.    Packing or dressings applied to the area should be changed daily.    Take medicine as directed and finish them.    Reduce activities and keep your foot elevated when able to reduce swelling and discomfort. Do this until the infection gets better.    Wear sandals or go barefoot as much as possible while the infected area is sensitive.    See your caregiver for follow-up care in 2-3 days if the infection is not better.   SEEK MEDICAL CARE IF:    Your toe is becoming more red, swollen or painful.  MAKE SURE YOU:     Understand these instructions.    Will watch your condition.    Will get help right away if you are not doing well or get worse.   Document Released: 03/02/2004 Document Revised: 01/12/2011 Document Reviewed: 01/20/2008  [employer] Patient Information 2012 [employer]

## 2011-05-03 NOTE — Telephone Encounter (Signed)
The patient called stating she has  An ingrown toe nail that is infected. Please advise, ER, UC or OV? [phone]

## 2011-06-15 ENCOUNTER — Other Ambulatory Visit: Payer: Self-pay | Admitting: Internal Medicine

## 2011-06-15 NOTE — Telephone Encounter (Signed)
Done hardcopy to robin  

## 2011-06-15 NOTE — Telephone Encounter (Signed)
Faxed hardcopy to pharmacy. 

## 2011-06-19 ENCOUNTER — Other Ambulatory Visit (INDEPENDENT_AMBULATORY_CARE_PROVIDER_SITE_OTHER): Payer: Medicare Other

## 2011-06-19 DIAGNOSIS — E119 Type 2 diabetes mellitus without complications: Secondary | ICD-10-CM

## 2011-06-19 LAB — LIPID PANEL
Cholesterol: 100 mg/dL (ref 0–200)
HDL: 36.9 mg/dL — ABNORMAL LOW (ref >39.00)
Triglycerides: 58 mg/dL (ref 0.0–149.0)

## 2011-06-19 LAB — BASIC METABOLIC PANEL
CO2: 24 mEq/L (ref 19–32)
Calcium: 9.4 mg/dL (ref 8.4–10.5)
Creatinine, Ser: 0.9 mg/dL (ref 0.4–1.2)
Glucose, Bld: 119 mg/dL — ABNORMAL HIGH (ref 70–99)

## 2011-06-21 ENCOUNTER — Encounter: Payer: Self-pay | Admitting: Internal Medicine

## 2011-06-21 ENCOUNTER — Ambulatory Visit (INDEPENDENT_AMBULATORY_CARE_PROVIDER_SITE_OTHER): Payer: Medicare Other | Admitting: Internal Medicine

## 2011-06-21 VITALS — BP 120/78 | HR 74 | Temp 97.2°F | Ht 61.0 in | Wt 231.0 lb

## 2011-06-21 DIAGNOSIS — L02419 Cutaneous abscess of limb, unspecified: Secondary | ICD-10-CM | POA: Insufficient documentation

## 2011-06-21 DIAGNOSIS — E119 Type 2 diabetes mellitus without complications: Secondary | ICD-10-CM

## 2011-06-21 DIAGNOSIS — H6692 Otitis media, unspecified, left ear: Secondary | ICD-10-CM

## 2011-06-21 DIAGNOSIS — E785 Hyperlipidemia, unspecified: Secondary | ICD-10-CM

## 2011-06-21 DIAGNOSIS — H6690 Otitis media, unspecified, unspecified ear: Secondary | ICD-10-CM

## 2011-06-21 DIAGNOSIS — I10 Essential (primary) hypertension: Secondary | ICD-10-CM

## 2011-06-21 DIAGNOSIS — Z0000 Encounter for general adult medical examination without abnormal findings: Secondary | ICD-10-CM

## 2011-06-21 DIAGNOSIS — IMO0002 Reserved for concepts with insufficient information to code with codable children: Secondary | ICD-10-CM

## 2011-06-21 MED ORDER — DOXYCYCLINE HYCLATE 100 MG PO TABS: 100.0000 mg | ORAL_TABLET | Freq: Two times a day (BID) | ORAL | Status: AC

## 2011-06-21 NOTE — Patient Instructions (Addendum)
Take all new medications as prescribed You can also take OTC Mucinex (or it's generic off brand) for congestion and ear discomfort Continue all other medications as before Please have the pharmacy call with any refills you may need. You are given the copy of your blood work today Please return in 6 mo with Lab testing done 3-5 days before

## 2011-06-24 ENCOUNTER — Encounter: Payer: Self-pay | Admitting: Internal Medicine

## 2011-06-24 NOTE — Progress Notes (Signed)
Subjective:    Patient ID: Mary Mccarthy, female    DOB: 07-10-1940, 71 y.o.   MRN: [id number]  HPI  Here with left ear pain acute mild to mod for 3-4 days, with fever and HA, but no overt sinus symptoms, ST , cough and Here to f/u,  Pt denies chest pain, increased sob or doe, wheezing, orthopnea, PND, increased LE swelling, palpitations, dizziness or syncope.  Pt denies new neurological symptoms such as new headache, or facial or extremity weakness or numbness   Pt denies polydipsia, polyuria, or low sugar symptoms such as weakness or confusion improved with po intake.  Pt states overall good compliance with meds, trying to follow lower cholesterol, diabetic diet, wt overall stable but little exercise however.  Also has mild recurring abscesses, currently with a small left axillary abscess that drained 2 days ago, now imroved. Past Medical History  Diagnosis Date  . Left knee DJD   . ALLERGIC RHINITIS 06/24/2007  . DEPRESSION 10/08/2006  . DIABETES MELLITUS, TYPE II 10/05/2006  . GERD 06/24/2007  . HYPERLIPIDEMIA 10/08/2006  . HYPERTENSION 10/08/2006  . PEPTIC ULCER DISEASE 10/08/2006  . COLONIC POLYPS, HX OF 06/24/2007  . INSOMNIA 09/24/2008   Past Surgical History  Procedure Date  . Breast biopsy   . Appendectomy   . Tubal ligation   . Tonsillectomy     reports that she has quit smoking. She does not have any smokeless tobacco history on file. She reports that she does not drink alcohol or use illicit drugs. family history includes Alcohol abuse in her other; Arthritis in her other; Cancer in her mother; Depression in her other; Diabetes in her other; Heart disease in her other; Hyperlipidemia in her other; Hypertension in her other; and Stroke in her other. Allergies  Allergen Reactions  . Ibuprofen    Current Outpatient Prescriptions on File Prior to Visit  Medication Sig Dispense Refill  . amLODipine (NORVASC) 10 MG tablet Take 1 tablet (10 mg total) by mouth daily.  90 tablet  3  .  aspirin 81 MG EC tablet Take 81 mg by mouth daily.        . clonazePAM (KLONOPIN) 0.5 MG tablet TAKE ONE TO TWO TABLETS BY MOUTH AT NIGHT AS NEEDED FOR SLEEP  60 tablet  2  . glimepiride (AMARYL) 2 MG tablet Take 1 tablet (2 mg total) by mouth daily before breakfast.  90 tablet  3  . hydrochlorothiazide (HYDRODIURIL) 25 MG tablet Take 1 tablet (25 mg total) by mouth daily.  90 tablet  3  . lisinopril (PRINIVIL,ZESTRIL) 40 MG tablet TAKE ONE TABLET BY MOUTH EVERY DAY  90 tablet  3  . metFORMIN (GLUCOPHAGE) 1000 MG tablet TAKE ONE TABLET BY MOUTH TWICE DAILY  180 tablet  3  . omeprazole (PRILOSEC) 20 MG capsule Take 1 capsule (20 mg total) by mouth 2 (two) times daily.  180 capsule  3  . pravastatin (PRAVACHOL) 40 MG tablet Take 1 tablet (40 mg total) by mouth daily.  90 tablet  3   Review of Systems Review of Systems  Constitutional: Negative for diaphoresis and unexpected weight change.  HENT: Negative for drooling and tinnitus.   Eyes: Negative for photophobia and visual disturbance.  Respiratory: Negative for choking and stridor.   Gastrointestinal: Negative for vomiting and blood in stool.  Genitourinary: Negative for hematuria and decreased urine volume.  Musculoskeletal: Negative for gait problem.  Neurological: Negative for tremors and numbness.  Psychiatric/Behavioral: Negative for decreased concentration. The patient  is not hyperactive.       Objective:   Physical Exam BP 120/78  Pulse 74  Temp(Src) 97.2 F (36.2 C) (Oral)  Ht 5\' 1"  (1.549 m)  Wt 231 lb (104.781 kg)  BMI 43.65 kg/m2  SpO2 95% Physical Exam  VS noted, mild ill Constitutional: Pt appears well-developed and well-nourished.  HENT: Head: Normocephalic.  Right Ear: External ear normal.  Left Ear: External ear normal.  Left TM severe red, right TM no erythema Eyes: Conjunctivae and EOM are normal. Pupils are equal, round, and reactive to light.  Neck: Normal range of motion. Neck supple.  Cardiovascular:  Normal rate and regular rhythm.   Pulmonary/Chest: Effort normal and breath sounds normal.  Neurological: Pt is alert. No cranial nerve deficit. motor/dtr/gait intact Skin: Skin is warm. No erythema. Except for left axillary 1/2 cm mild tender abscess without drainage or fluctuance  Psychiatric: Pt behavior is normal. Thought content normal. 1+ nervous    Assessment & Plan:

## 2011-06-24 NOTE — Assessment & Plan Note (Signed)
stable overall by hx and exam, most recent data reviewed with pt, and pt to continue medical treatment as before Lab Results  Component Value Date   HGBA1C 6.4 06/19/2011

## 2011-06-24 NOTE — Assessment & Plan Note (Signed)
Mild to mod, for antibx course,  to f/u any worsening symptoms or concerns 

## 2011-06-24 NOTE — Assessment & Plan Note (Signed)
stable overall by hx and exam, most recent data reviewed with pt, and pt to continue medical treatment as before Lab Results  Component Value Date   LDLCALC 52 06/19/2011

## 2011-09-07 ENCOUNTER — Emergency Department (HOSPITAL_COMMUNITY)
Admission: EM | Admit: 2011-09-07 | Discharge: 2011-09-07 | Disposition: A | Discharge status: Home / Self Care | Payer: Medicare Other | Source: Home / Self Care | Attending: Emergency Medicine | Admitting: Emergency Medicine

## 2011-09-07 ENCOUNTER — Encounter (HOSPITAL_COMMUNITY): Payer: Self-pay

## 2011-09-07 ENCOUNTER — Emergency Department (INDEPENDENT_AMBULATORY_CARE_PROVIDER_SITE_OTHER): Payer: Medicare Other

## 2011-09-07 DIAGNOSIS — S99919A Unspecified injury of unspecified ankle, initial encounter: Secondary | ICD-10-CM

## 2011-09-07 DIAGNOSIS — S86119A Strain of other muscle(s) and tendon(s) of posterior muscle group at lower leg level, unspecified leg, initial encounter: Secondary | ICD-10-CM

## 2011-09-07 DIAGNOSIS — S93499A Sprain of other ligament of unspecified ankle, initial encounter: Secondary | ICD-10-CM

## 2011-09-07 IMAGING — CR DG ANKLE COMPLETE 3+V*R*
3 series · 3 of 3 positions shown · non-contrast
Comparison: None.

CLINICAL DATA: Medial ankle pain since yesterday, no history of
trauma

RIGHT ANKLE - COMPLETE 3+ VIEW

[view not recorded (1 of 3)]
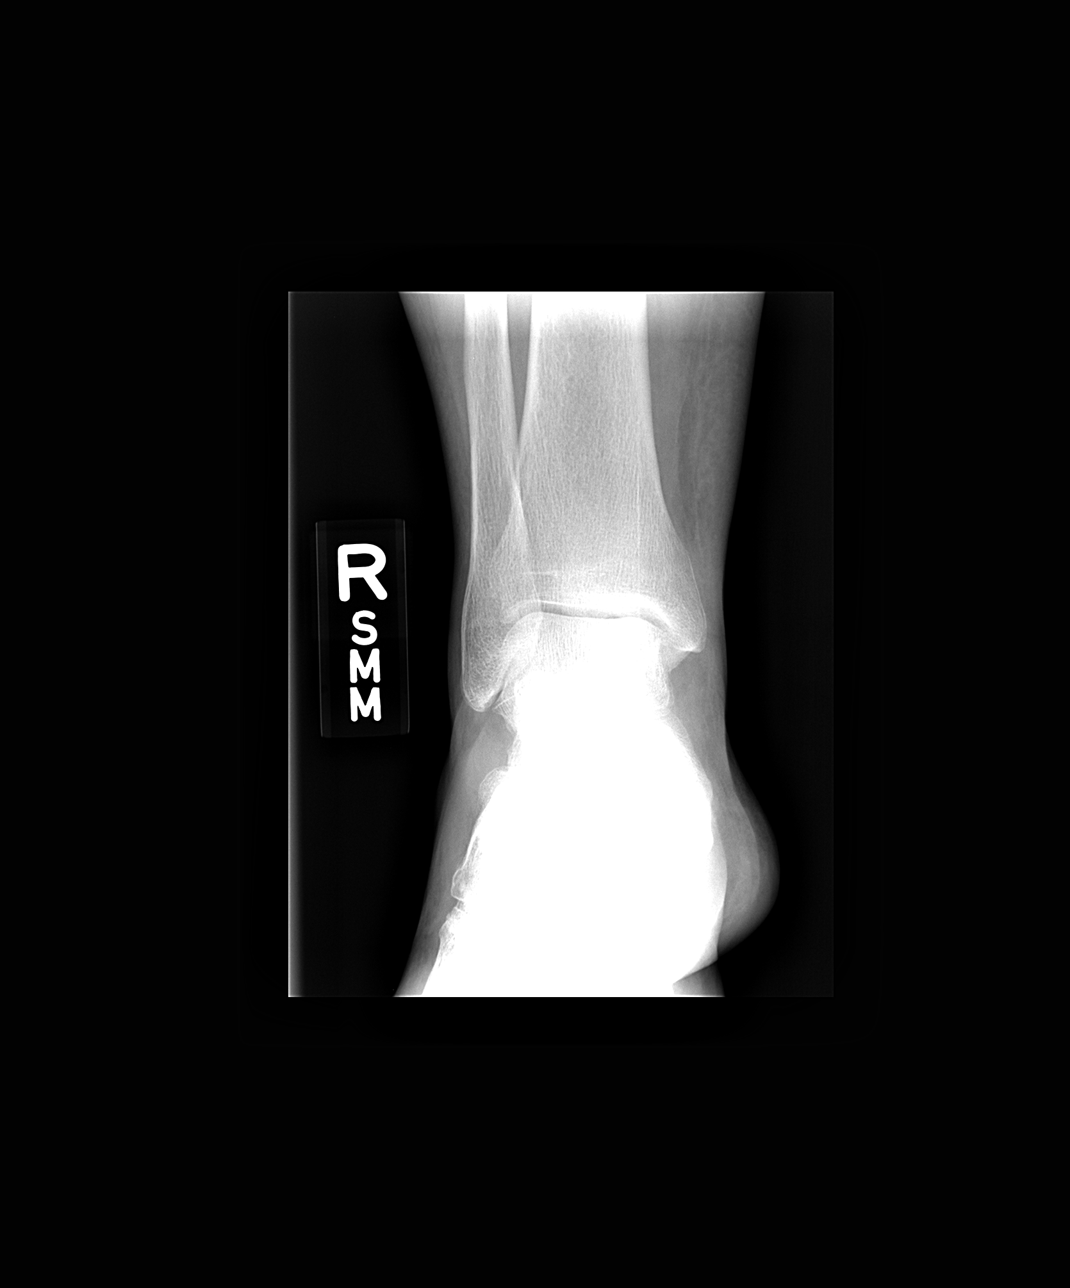

[view not recorded (2 of 3)]
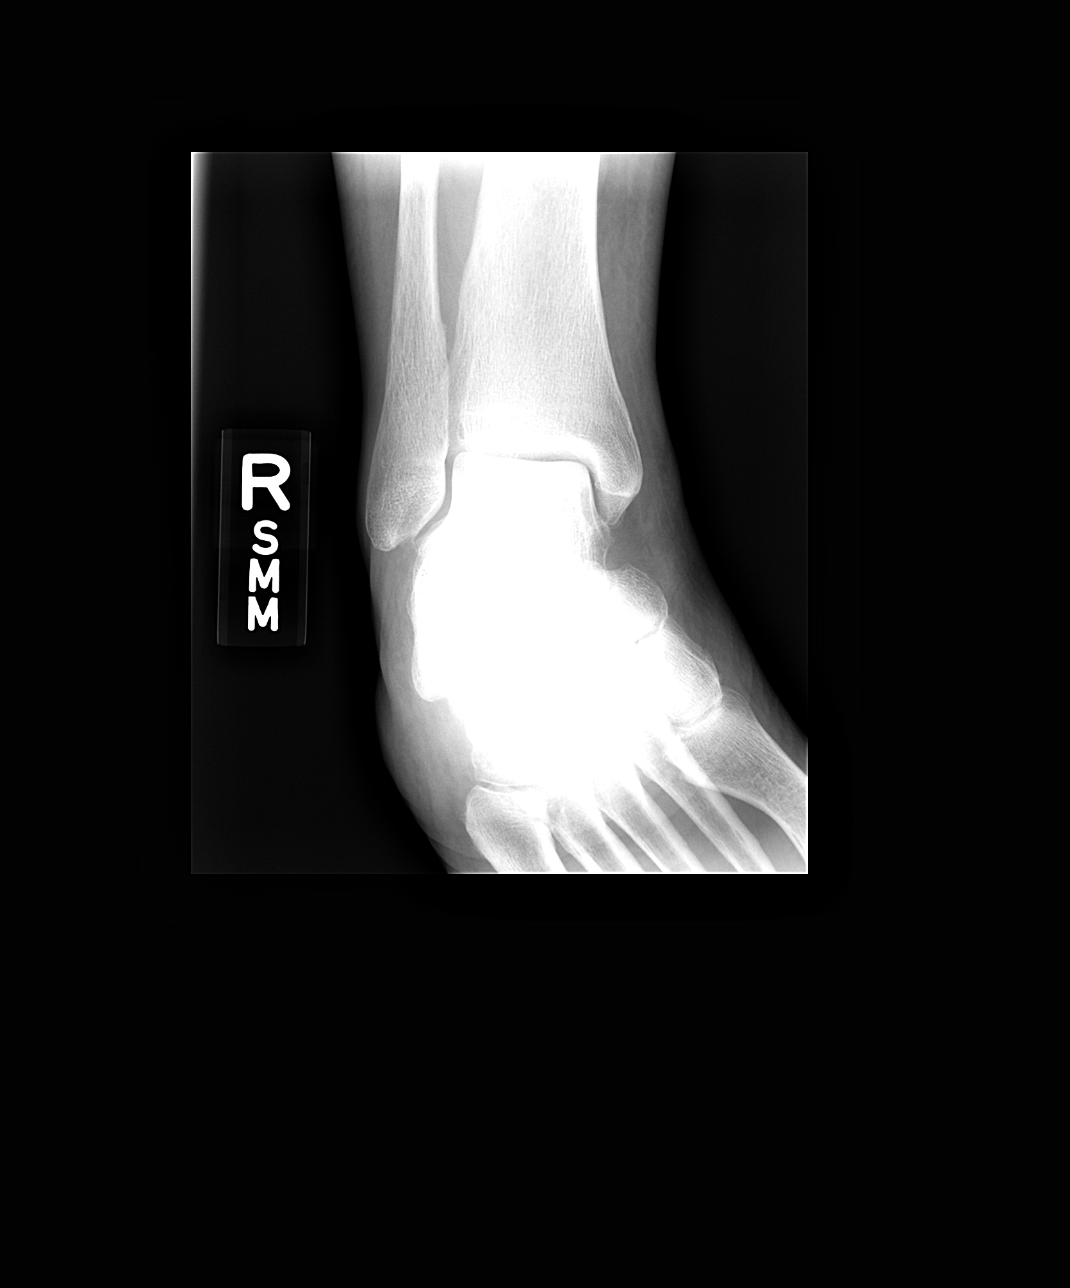

[view not recorded (3 of 3)]
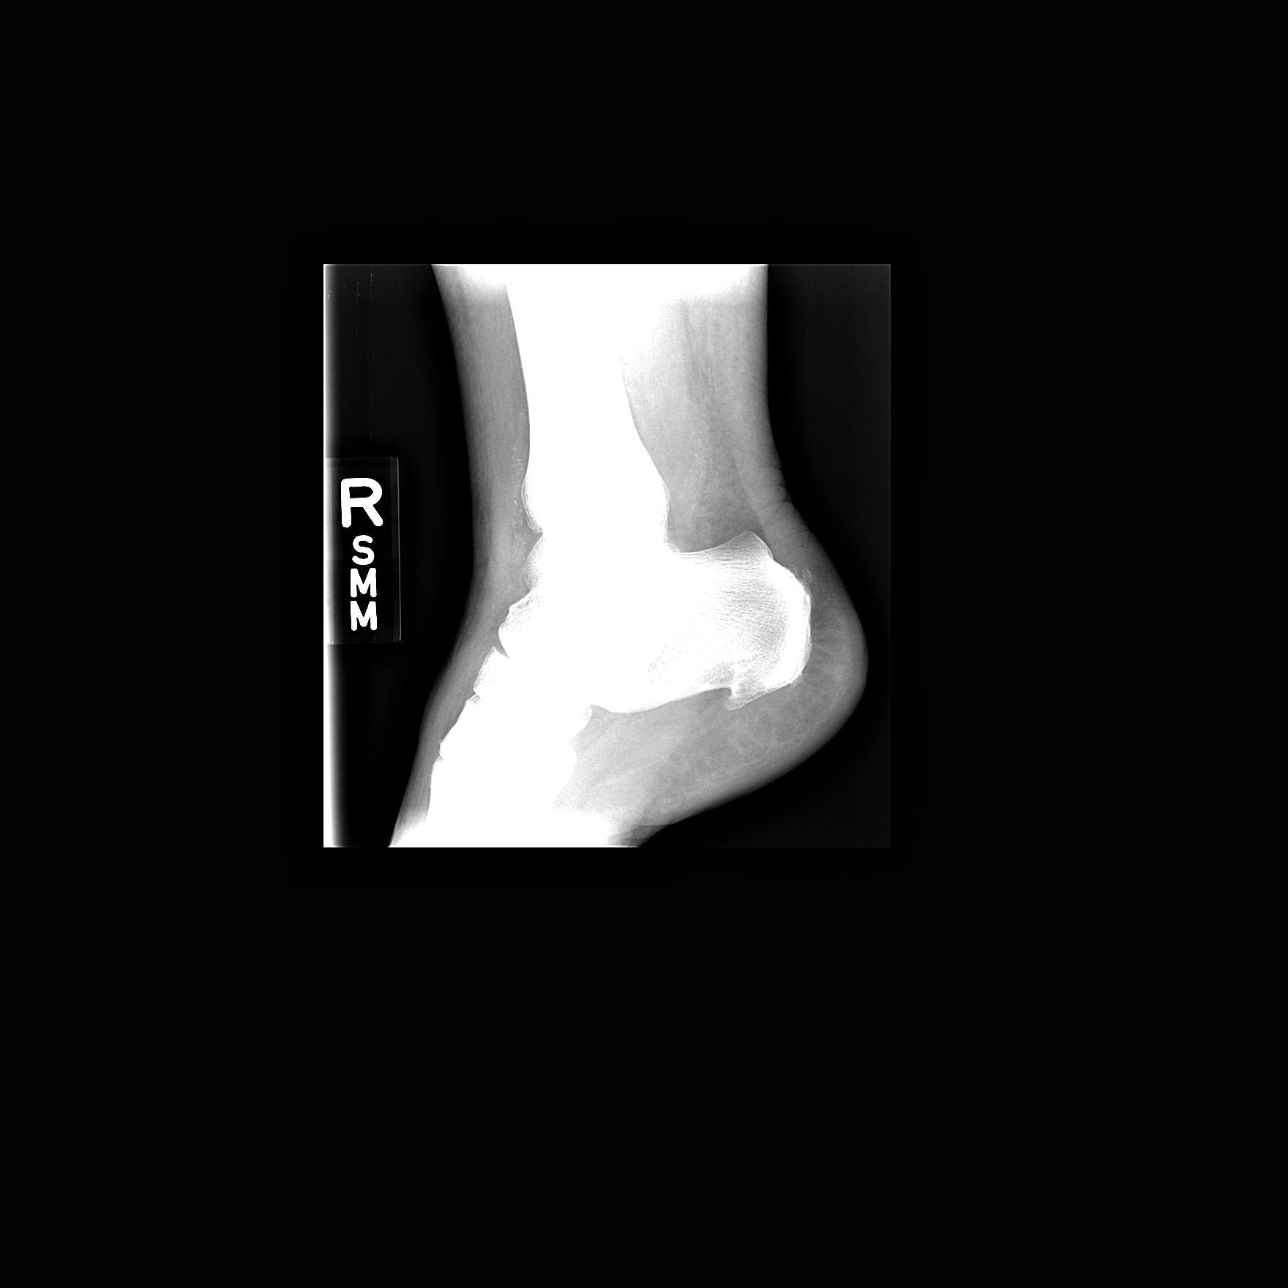

[3 of 3 positions shown; findings below may reference images not displayed]

FINDINGS: No fracture or dislocation.  There is minimal soft tissue swelling
induration about the medial aspects of the ankle and lower leg.  No
radiopaque foreign body.  No definite evidence of osteolysis to
suggest osteomyelitis.  No ankle joint effusion.  Joint spaces are
preserved.  Small plantar calcaneal spur.
IMPRESSION: Minimal soft tissue swelling about the medial aspect of the ankle
without associated fracture or radiopaque foreign body.

## 2011-09-07 MED ORDER — MELOXICAM 7.5 MG PO TABS: 7.5000 mg | ORAL_TABLET | Freq: Every day | ORAL | Status: DC

## 2011-09-07 NOTE — ED Notes (Signed)
Pt states she felt a pop and pain in the medial aspect of rt ankle yesterday while walking.  States it continues to be very painful, especially with weight bearing.

## 2011-09-07 NOTE — ED Provider Notes (Signed)
Chief Complaint  Patient presents with  . Ankle Pain    History of Present Illness:   The patient is a 71 year old female who was walking up a ramp at the hospital yesterday when she felt a sudden pain and heard a snap in the medial aspect of her right foot, just behind the the medial malleolus. The pain it radiated up into the medial leg. It would hurt to stand. It seems somewhat swollen. She noted a little bit of numbness and tingling at the tips of the toes. She denies any injury. She did not twist the ankle. There is no muscle weakness.  Review of Systems:  Other than noted above, the patient denies any of the following symptoms: Systemic:  No fevers, chills, sweats, or aches.  No fatigue or tiredness. Musculoskeletal:  No joint pain, arthritis, bursitis, swelling, back pain, or neck pain. Neurological:  No muscular weakness, paresthesias, headache, or trouble with speech or coordination.  No dizziness.   PMFSH:  Past medical history, family history, social history, meds, and allergies were reviewed.  Physical Exam:   Vital signs:  BP 143/66  Pulse 82  Temp 98.2 F (36.8 C) (Oral)  Resp 20  SpO2 96% Gen:  Alert and oriented times 3.  In no distress. Musculoskeletal: No swelling, bruising, or deformity. There is pain to palpation just behind the medial malleolus, extending down into the instep of the foot and up into the medial aspect of the leg. There is pain on resisted inversion of the foot. The ankle has a full range of motion with no pain. Otherwise, all joints had a full a ROM with no swelling, bruising or deformity.  No edema, pulses full. Extremities were warm and pink.  Capillary refill was brisk.  Skin:  Clear, warm and dry.  No rash. Neuro:  Alert and oriented times 3.  Muscle strength was normal.  Sensation was intact to light touch.   Radiology:  Dg Ankle Complete Right  09/07/2011  *RADIOLOGY REPORT*  Clinical Data: Medial ankle pain since yesterday, no history of trauma   RIGHT ANKLE - COMPLETE 3+ VIEW  Comparison: None.  Findings:  No fracture or dislocation.  There is minimal soft tissue swelling induration about the medial aspects of the ankle and lower leg.  No radiopaque foreign body.  No definite evidence of osteolysis to suggest osteomyelitis.  No ankle joint effusion.  Joint spaces are preserved.  Small plantar calcaneal spur.  IMPRESSION: Minimal soft tissue swelling about the medial aspect of the ankle without associated fracture or radiopaque foreign body.  Original Report Authenticated By: Waynard Reeds, M.D.  Course in Urgent Care Center:   The patient was placed in a Cam Walker boot and given crutches for ambulation.   Assessment:  The encounter diagnosis was Tibialis posterior tendon rupture.  Plan:   1.  The following meds were prescribed:   New Prescriptions   MELOXICAM (MOBIC) 7.5 MG TABLET    Take 1 tablet (7.5 mg total) by mouth daily.   2.  The patient was instructed in symptomatic care, including rest and activity, elevation, application of ice and compression.  Appropriate handouts were given. 3.  The patient was told to return if becoming worse in any way, if no better in 3 or 4 days, and given some red flag symptoms that would indicate earlier return.   4.  The patient was told to follow up with Dr. Aldean Baker next week.   Reuben Likes, MD  09/07/11 1829 

## 2011-11-11 ENCOUNTER — Other Ambulatory Visit: Payer: Self-pay | Admitting: Internal Medicine

## 2011-11-13 NOTE — Telephone Encounter (Signed)
Done hardcopy to robin  

## 2011-11-13 NOTE — Telephone Encounter (Signed)
Faxed hardcopy to pharmacy. 

## 2011-12-26 ENCOUNTER — Other Ambulatory Visit (INDEPENDENT_AMBULATORY_CARE_PROVIDER_SITE_OTHER): Payer: Medicare Other

## 2011-12-26 DIAGNOSIS — E119 Type 2 diabetes mellitus without complications: Secondary | ICD-10-CM

## 2011-12-26 DIAGNOSIS — Z0000 Encounter for general adult medical examination without abnormal findings: Secondary | ICD-10-CM

## 2011-12-26 LAB — BASIC METABOLIC PANEL
Chloride: 96 mEq/L (ref 96–112)
GFR: 46.78 mL/min — ABNORMAL LOW (ref >60.00)
Potassium: 3.8 mEq/L (ref 3.5–5.1)
Sodium: 133 mEq/L — ABNORMAL LOW (ref 135–145)

## 2011-12-26 LAB — URINALYSIS, ROUTINE W REFLEX MICROSCOPIC
Hgb urine dipstick: NEGATIVE
Ketones, ur: NEGATIVE
pH: 5.5 (ref 5.0–8.0)

## 2011-12-26 LAB — CBC WITH DIFFERENTIAL/PLATELET
Eosinophils Absolute: 0.1 10*3/uL (ref 0.0–0.7)
Eosinophils Relative: 1.8 % (ref 0.0–5.0)
HCT: 36.8 % (ref 36.0–46.0)
Lymphs Abs: 2.3 10*3/uL (ref 0.7–4.0)
MCHC: 33.2 g/dL (ref 30.0–36.0)
MCV: 94.8 fl (ref 78.0–100.0)
Monocytes Absolute: 0.7 10*3/uL (ref 0.1–1.0)
Platelets: 234 10*3/uL (ref 150.0–400.0)
RDW: 13.1 % (ref 11.5–14.6)
WBC: 5.7 10*3/uL (ref 4.5–10.5)

## 2011-12-26 LAB — LIPID PANEL
LDL Cholesterol: 52 mg/dL (ref 0–99)
Total CHOL/HDL Ratio: 3
VLDL: 18.8 mg/dL (ref 0.0–40.0)

## 2011-12-26 LAB — MICROALBUMIN / CREATININE URINE RATIO
Creatinine,U: 294.2 mg/dL
Microalb, Ur: 1.7 mg/dL (ref 0.0–1.9)

## 2011-12-26 LAB — HEPATIC FUNCTION PANEL
AST: 30 U/L (ref 0–37)
Alkaline Phosphatase: 54 U/L (ref 39–117)
Bilirubin, Direct: 0.2 mg/dL (ref 0.0–0.3)
Total Bilirubin: 1.1 mg/dL (ref 0.3–1.2)

## 2011-12-26 LAB — TSH: TSH: 3.85 u[IU]/mL (ref 0.35–5.50)

## 2011-12-28 ENCOUNTER — Encounter: Payer: Self-pay | Admitting: Internal Medicine

## 2011-12-28 ENCOUNTER — Ambulatory Visit (INDEPENDENT_AMBULATORY_CARE_PROVIDER_SITE_OTHER): Payer: Medicare Other | Admitting: Internal Medicine

## 2011-12-28 VITALS — BP 112/64 | HR 82 | Temp 97.0°F | Ht 61.0 in | Wt 230.0 lb

## 2011-12-28 DIAGNOSIS — E119 Type 2 diabetes mellitus without complications: Secondary | ICD-10-CM

## 2011-12-28 DIAGNOSIS — Z136 Encounter for screening for cardiovascular disorders: Secondary | ICD-10-CM

## 2011-12-28 DIAGNOSIS — Z0000 Encounter for general adult medical examination without abnormal findings: Secondary | ICD-10-CM

## 2011-12-28 DIAGNOSIS — Z23 Encounter for immunization: Secondary | ICD-10-CM

## 2011-12-28 MED ORDER — METFORMIN HCL 1000 MG PO TABS: ORAL_TABLET | ORAL | Status: DC

## 2011-12-28 MED ORDER — AMLODIPINE BESYLATE 10 MG PO TABS: 10.0000 mg | ORAL_TABLET | Freq: Every day | ORAL | Status: DC

## 2011-12-28 MED ORDER — GLIMEPIRIDE 2 MG PO TABS: 2.0000 mg | ORAL_TABLET | Freq: Every day | ORAL | Status: DC

## 2011-12-28 MED ORDER — OMEPRAZOLE 20 MG PO CPDR: 20.0000 mg | DELAYED_RELEASE_CAPSULE | Freq: Two times a day (BID) | ORAL | Status: DC

## 2011-12-28 MED ORDER — LISINOPRIL 40 MG PO TABS: ORAL_TABLET | ORAL | Status: DC

## 2011-12-28 MED ORDER — HYDROCHLOROTHIAZIDE 25 MG PO TABS: 25.0000 mg | ORAL_TABLET | Freq: Every day | ORAL | Status: DC

## 2011-12-28 MED ORDER — OMEPRAZOLE 20 MG PO CPDR
20.0000 mg | DELAYED_RELEASE_CAPSULE | Freq: Two times a day (BID) | ORAL | Status: DC
Start: 2011-12-28 — End: 2011-12-28

## 2011-12-28 MED ORDER — PRAVASTATIN SODIUM 40 MG PO TABS: 40.0000 mg | ORAL_TABLET | Freq: Every day | ORAL | Status: DC

## 2011-12-28 NOTE — Progress Notes (Signed)
Subjective:    Patient ID: Mary Mccarthy, female    DOB: 12-Sep-1940, 71 y.o.   MRN: [id number]  HPI Here for wellness and f/u;  Overall doing ok;  Pt denies CP, worsening SOB, DOE, wheezing, orthopnea, PND, worsening LE edema, palpitations, dizziness or syncope.  Pt denies neurological change such as new Headache, facial or extremity weakness.  Pt denies polydipsia, polyuria, or low sugar symptoms. Pt states overall good compliance with treatment and medications, good tolerability, and trying to follow lower cholesterol diet.  Pt denies worsening depressive symptoms, suicidal ideation or panic. No fever, wt loss, night sweats, loss of appetite, or other constitutional symptoms.  Pt states good ability with ADL's, low fall risk, home safety reviewed and adequate, no significant changes in hearing or vision, and occasionally active with exercise.  A1c is slightly elevated at 6.9 but admits to only takin gthe second tab of metformin about every other day due to nausea. Can take the one pill per day ok.   Has not needed the mobic so stopped, no signficant pain now.  Only takes klonopin occas to rarely. Past Medical History  Diagnosis Date  . Left knee DJD   . ALLERGIC RHINITIS 06/24/2007  . DEPRESSION 10/08/2006  . DIABETES MELLITUS, TYPE II 10/05/2006  . GERD 06/24/2007  . HYPERLIPIDEMIA 10/08/2006  . HYPERTENSION 10/08/2006  . PEPTIC ULCER DISEASE 10/08/2006  . COLONIC POLYPS, HX OF 06/24/2007  . INSOMNIA 09/24/2008   Past Surgical History  Procedure Date  . Breast biopsy   . Appendectomy   . Tubal ligation   . Tonsillectomy     reports that she has quit smoking. She does not have any smokeless tobacco history on file. She reports that she does not drink alcohol or use illicit drugs. family history includes Alcohol abuse in her other; Arthritis in her other; Cancer in her mother; Depression in her other; Diabetes in her other; Heart disease in her other; Hyperlipidemia in her other; Hypertension in her  other; and Stroke in her other. Allergies  Allergen Reactions  . Ibuprofen    Current Outpatient Prescriptions on File Prior to Visit  Medication Sig Dispense Refill  . amLODipine (NORVASC) 10 MG tablet Take 1 tablet (10 mg total) by mouth daily.  90 tablet  3  . aspirin 81 MG EC tablet Take 81 mg by mouth daily.        . clonazePAM (KLONOPIN) 0.5 MG tablet TAKE ONE TO TWO TABLETS BY MOUTH AT NIGHT AS NEEDED FOR SLEEP  60 tablet  1  . glimepiride (AMARYL) 2 MG tablet Take 1 tablet (2 mg total) by mouth daily before breakfast.  90 tablet  3  . hydrochlorothiazide (HYDRODIURIL) 25 MG tablet Take 1 tablet (25 mg total) by mouth daily.  90 tablet  3  . lisinopril (PRINIVIL,ZESTRIL) 40 MG tablet TAKE ONE TABLET BY MOUTH EVERY DAY  90 tablet  3  . metFORMIN (GLUCOPHAGE) 1000 MG tablet TAKE ONE TABLET BY MOUTH TWICE DAILY  180 tablet  3  . omeprazole (PRILOSEC) 20 MG capsule Take 1 capsule (20 mg total) by mouth 2 (two) times daily.  180 capsule  3  . pravastatin (PRAVACHOL) 40 MG tablet Take 1 tablet (40 mg total) by mouth daily.  90 tablet  3   Review of Systems Review of Systems  Constitutional: Negative for diaphoresis, activity change, appetite change and unexpected weight change.  HENT: Negative for hearing loss, ear pain, facial swelling, mouth sores and neck stiffness.  Eyes: Negative for pain, redness and visual disturbance.  Respiratory: Negative for shortness of breath and wheezing.   Cardiovascular: Negative for chest pain and palpitations.  Gastrointestinal: Negative for diarrhea, blood in stool, abdominal distention and rectal pain.  Genitourinary: Negative for hematuria, flank pain and decreased urine volume.  Musculoskeletal: Negative for myalgias and joint swelling.  Skin: Negative for color change and wound.  Neurological: Negative for syncope and numbness.  Hematological: Negative for adenopathy.  Psychiatric/Behavioral: Negative for hallucinations, self-injury, decreased  concentration and agitation.      Objective:   Physical Exam BP 112/64  Pulse 82  Temp 97 F (36.1 C) (Oral)  Ht 5\' 1"  (1.549 m)  Wt 230 lb (104.327 kg)  BMI 43.46 kg/m2  SpO2 95% Physical Exam  VS noted Constitutional: Pt is oriented to person, place, and time. Appears well-developed and well-nourished.  HENT:  Head: Normocephalic and atraumatic.  Right Ear: External ear normal.  Left Ear: External ear normal.  Nose: Nose normal.  Mouth/Throat: Oropharynx is clear and moist.  Eyes: Conjunctivae and EOM are normal. Pupils are equal, round, and reactive to light.  Neck: Normal range of motion. Neck supple. No JVD present. No tracheal deviation present.  Cardiovascular: Normal rate, regular rhythm, normal heart sounds and intact distal pulses.   Pulmonary/Chest: Effort normal and breath sounds normal.  Abdominal: Soft. Bowel sounds are normal. There is no tenderness.  Musculoskeletal: Normal range of motion. Exhibits no edema.  Lymphadenopathy:  Has no cervical adenopathy.  Neurological: Pt is alert and oriented to person, place, and time. Pt has normal reflexes. No cranial nerve deficit.  Skin: Skin is warm and dry. No rash noted.  Psychiatric:  Has  normal mood and affect. Behavior is normal. 1+ nervous    Assessment & Plan:

## 2011-12-28 NOTE — Assessment & Plan Note (Signed)

## 2011-12-28 NOTE — Assessment & Plan Note (Addendum)
stable overall by hx and exam, most recent data reviewed with pt, and pt to continue medical treatment as before except to reduce the metformin to 1000 am, 500 pm for better tolerability Lab Results  Component Value Date   HGBA1C 6.9* 12/26/2011

## 2011-12-28 NOTE — Patient Instructions (Addendum)
OK to take the metformin today at the reduced strength in the afternoon, due to the side effect. You had the flu shot today Continue all other medications as before All of your medications were refilled today Your EKG and lab work was OK today Thank you for enrolling in MyChart. Please follow the instructions below to securely access your online medical record. MyChart allows you to send messages to your doctor, view your test results, renew your prescriptions, schedule appointments, and more. To Log into MyChart, please go to [email or web address], and your Username is:  [email or web address] You are otherwise up to date with prevention measures today Please continue your efforts at being more active, low cholesterol diet, and weight control. Please return in 6 mo with Lab testing done 3-5 days before

## 2012-06-27 ENCOUNTER — Other Ambulatory Visit (INDEPENDENT_AMBULATORY_CARE_PROVIDER_SITE_OTHER): Payer: Medicare Other

## 2012-06-27 ENCOUNTER — Encounter: Payer: Self-pay | Admitting: Internal Medicine

## 2012-06-27 ENCOUNTER — Ambulatory Visit (INDEPENDENT_AMBULATORY_CARE_PROVIDER_SITE_OTHER): Payer: Medicare Other | Admitting: Internal Medicine

## 2012-06-27 VITALS — BP 122/60 | HR 80 | Temp 98.4°F | Ht 61.0 in | Wt 231.8 lb

## 2012-06-27 DIAGNOSIS — I10 Essential (primary) hypertension: Secondary | ICD-10-CM

## 2012-06-27 DIAGNOSIS — E119 Type 2 diabetes mellitus without complications: Secondary | ICD-10-CM

## 2012-06-27 DIAGNOSIS — E785 Hyperlipidemia, unspecified: Secondary | ICD-10-CM

## 2012-06-27 DIAGNOSIS — H6690 Otitis media, unspecified, unspecified ear: Secondary | ICD-10-CM

## 2012-06-27 DIAGNOSIS — H6691 Otitis media, unspecified, right ear: Secondary | ICD-10-CM | POA: Insufficient documentation

## 2012-06-27 LAB — BASIC METABOLIC PANEL
BUN: 21 mg/dL (ref 6–23)
Calcium: 9.7 mg/dL (ref 8.4–10.5)
GFR: 61.92 mL/min (ref >60.00)
Glucose, Bld: 137 mg/dL — ABNORMAL HIGH (ref 70–99)

## 2012-06-27 LAB — LIPID PANEL
Cholesterol: 100 mg/dL (ref 0–200)
HDL: 33 mg/dL — ABNORMAL LOW (ref >39.00)
VLDL: 14.6 mg/dL (ref 0.0–40.0)

## 2012-06-27 LAB — HEPATIC FUNCTION PANEL: Albumin: 3.6 g/dL (ref 3.5–5.2)

## 2012-06-27 LAB — HEMOGLOBIN A1C: Hgb A1c MFr Bld: 7.1 % — ABNORMAL HIGH (ref 4.6–6.5)

## 2012-06-27 MED ORDER — CLONAZEPAM 0.5 MG PO TABS: ORAL_TABLET | ORAL | Status: DC

## 2012-06-27 MED ORDER — AMOXICILLIN 500 MG PO CAPS: ORAL_CAPSULE | ORAL | Status: DC

## 2012-06-27 NOTE — Assessment & Plan Note (Signed)
stable overall by history and exam, recent data reviewed with pt, and pt to continue medical treatment as before,  to f/u any worsening symptoms or concerns BP Readings from Last 3 Encounters:  06/27/12 122/60  12/28/11 112/64  09/07/11 143/66

## 2012-06-27 NOTE — Assessment & Plan Note (Signed)
Mild to mod, for antibx course,  to f/u any worsening symptoms or concerns 

## 2012-06-27 NOTE — Patient Instructions (Signed)
Please take all new medication as prescribed - the antibiotic Please continue all other medications as before, and refills have been done if requested. You can also take Delsym OTC for cough, and/or Mucinex (or it's generic off brand) for congestion, and tylenol as needed for pain, and Allegra for allergy symptoms Please have the pharmacy call with any other refills you may need.  Please go to the LAB in the Basement (turn left off the elevator) for the tests to be done today  You will be contacted by phone if any changes need to be made immediately.  Otherwise, you will receive a letter about your results with an explanation, but please check with MyChart first.  Thank you for enrolling in MyChart. Please follow the instructions below to securely access your online medical record. MyChart allows you to send messages to your doctor, view your test results, renew your prescriptions, schedule appointments, and more  Please return in 6 months, or sooner if needed, with Lab testing done 3-5 days before

## 2012-06-27 NOTE — Assessment & Plan Note (Signed)
stable overall by history and exam, recent data reviewed with pt, and pt to continue medical treatment as before,  to f/u any worsening symptoms or concerns Lab Results  Component Value Date   HGBA1C 6.9* 12/26/2011

## 2012-06-27 NOTE — Assessment & Plan Note (Signed)
stable overall by history and exam, recent data reviewed with pt, and pt to continue medical treatment as before,  to f/u any worsening symptoms or concerns Lab Results  Component Value Date   LDLCALC 52 12/26/2011

## 2012-06-27 NOTE — Progress Notes (Signed)
Subjective:    Patient ID: Mary Mccarthy, female    DOB: November 17, 1940, 72 y.o.   MRN: [id number]  HPI   Here with 2-3 days acute onset fever, facial pain, severe right ear pain and pressure, headache, general weakness and malaise,, with mild ST and non prod cough, with even some nausea last evenin, but pt denies chest pain, wheezing, increased sob or doe, orthopnea, PND, increased LE swelling, palpitations, dizziness or syncope. Pt denies new neurological symptoms such as new headache, or facial or extremity weakness or numbness  Pt denies polydipsia, polyuria, or low sugar symptoms such as weakness or confusion improved with po intake.  Pt states overall good compliance with meds, trying to follow lower cholesterol, diabetic diet, wt overall stable but little exercise however.    Pt continues to have minor recurring LBP without change in severity, bowel or bladder change, fever, wt loss,  worsening LE pain/numbness/weakness, gait change or falls. Past Medical History  Diagnosis Date  . Left knee DJD   . ALLERGIC RHINITIS 06/24/2007  . DEPRESSION 10/08/2006  . DIABETES MELLITUS, TYPE II 10/05/2006  . GERD 06/24/2007  . HYPERLIPIDEMIA 10/08/2006  . HYPERTENSION 10/08/2006  . PEPTIC ULCER DISEASE 10/08/2006  . COLONIC POLYPS, HX OF 06/24/2007  . INSOMNIA 09/24/2008   Past Surgical History  Procedure Laterality Date  . Breast biopsy    . Appendectomy    . Tubal ligation    . Tonsillectomy      reports that she has quit smoking. She does not have any smokeless tobacco history on file. She reports that she does not drink alcohol or use illicit drugs. family history includes Alcohol abuse in her other; Arthritis in her other; Cancer in her mother; Depression in her other; Diabetes in her other; Heart disease in her other; Hyperlipidemia in her other; Hypertension in her other; and Stroke in her other. Allergies  Allergen Reactions  . Ibuprofen    Current Outpatient Prescriptions on File Prior to Visit   Medication Sig Dispense Refill  . amLODipine (NORVASC) 10 MG tablet Take 1 tablet (10 mg total) by mouth daily.  30 tablet  11  . aspirin 81 MG EC tablet Take 81 mg by mouth daily.        Marland Kitchen glimepiride (AMARYL) 2 MG tablet Take 1 tablet (2 mg total) by mouth daily before breakfast.  30 tablet  11  . hydrochlorothiazide (HYDRODIURIL) 25 MG tablet Take 1 tablet (25 mg total) by mouth daily.  30 tablet  11  . lisinopril (PRINIVIL,ZESTRIL) 40 MG tablet TAKE ONE TABLET BY MOUTH EVERY DAY  30 tablet  11  . metFORMIN (GLUCOPHAGE) 1000 MG tablet 1 tab by mouth in the AM,and 1/2 tab in the PM  180 tablet  3  . omeprazole (PRILOSEC) 20 MG capsule Take 1 capsule (20 mg total) by mouth 2 (two) times daily.  60 capsule  11  . pravastatin (PRAVACHOL) 40 MG tablet Take 1 tablet (40 mg total) by mouth daily.  30 tablet  11   No current facility-administered medications on file prior to visit.   Review of Systems  Constitutional: Negative for unexpected weight change, or unusual diaphoresis  HENT: Negative for tinnitus.   Eyes: Negative for photophobia and visual disturbance.  Respiratory: Negative for choking and stridor.   Gastrointestinal: Negative for vomiting and blood in stool.  Genitourinary: Negative for hematuria and decreased urine volume.  Musculoskeletal: Negative for acute joint swelling Skin: Negative for color change and wound.  Neurological:  Negative for tremors and numbness other than noted  Psychiatric/Behavioral: Negative for decreased concentration or  hyperactivity.       Objective:   Physical Exam BP 122/60  Pulse 80  Temp(Src) 98.4 F (36.9 C) (Oral)  Ht 5\' 1"  (1.549 m)  Wt 231 lb 12 oz (105.121 kg)  BMI 43.81 kg/m2  SpO2 97% VS noted, mild ill Constitutional: Pt appears well-developed and well-nourished.  HENT: Head: NCAT.  Right Ear: External ear normal.  Left Ear: External ear normal.  Bilat tm's with mild erythema, right >>> left.  Max sinus areas mild tender.   Pharynx with mild erythema, no exudate Eyes: Conjunctivae and EOM are normal. Pupils are equal, round, and reactive to light.  Neck: Normal range of motion. Neck supple.  Cardiovascular: Normal rate and regular rhythm.   Pulmonary/Chest: Effort normal and breath sounds normal.  Neurological: Pt is alert. Not confused  Skin: Skin is warm. No erythema.  Psychiatric: Pt behavior is normal. Thought content normal.     Assessment & Plan:

## 2012-11-19 ENCOUNTER — Ambulatory Visit (INDEPENDENT_AMBULATORY_CARE_PROVIDER_SITE_OTHER): Payer: Medicare Other

## 2012-11-19 ENCOUNTER — Ambulatory Visit (INDEPENDENT_AMBULATORY_CARE_PROVIDER_SITE_OTHER): Payer: Medicare Other | Admitting: Internal Medicine

## 2012-11-19 ENCOUNTER — Encounter: Payer: Self-pay | Admitting: Internal Medicine

## 2012-11-19 VITALS — BP 120/70 | HR 83 | Temp 99.5°F | Ht 61.0 in | Wt 225.5 lb

## 2012-11-19 DIAGNOSIS — E119 Type 2 diabetes mellitus without complications: Secondary | ICD-10-CM

## 2012-11-19 DIAGNOSIS — I10 Essential (primary) hypertension: Secondary | ICD-10-CM

## 2012-11-19 DIAGNOSIS — Z0000 Encounter for general adult medical examination without abnormal findings: Secondary | ICD-10-CM

## 2012-11-19 DIAGNOSIS — D649 Anemia, unspecified: Secondary | ICD-10-CM

## 2012-11-19 DIAGNOSIS — Z23 Encounter for immunization: Secondary | ICD-10-CM

## 2012-11-19 LAB — CBC WITH DIFFERENTIAL/PLATELET
Basophils Absolute: 0 10*3/uL (ref 0.0–0.1)
Basophils Relative: 0.6 % (ref 0.0–3.0)
Eosinophils Absolute: 0.1 10*3/uL (ref 0.0–0.7)
Eosinophils Relative: 2.7 % (ref 0.0–5.0)
HCT: 30.1 % — ABNORMAL LOW (ref 36.0–46.0)
Lymphocytes Relative: 42.2 % (ref 12.0–46.0)
Lymphs Abs: 2.2 10*3/uL (ref 0.7–4.0)
MCHC: 32.9 g/dL (ref 30.0–36.0)
Neutrophils Relative %: 46.5 % (ref 43.0–77.0)
Platelets: 250 10*3/uL (ref 150.0–400.0)
RBC: 3.51 Mil/uL — ABNORMAL LOW (ref 3.87–5.11)
RDW: 13.5 % (ref 11.5–14.6)
WBC: 5.1 10*3/uL (ref 4.5–10.5)

## 2012-11-19 LAB — BASIC METABOLIC PANEL
BUN: 24 mg/dL — ABNORMAL HIGH (ref 6–23)
CO2: 28 mEq/L (ref 19–32)
Chloride: 100 mEq/L (ref 96–112)
Creatinine, Ser: 0.8 mg/dL (ref 0.4–1.2)
Potassium: 4.4 mEq/L (ref 3.5–5.1)

## 2012-11-19 LAB — LIPID PANEL
Cholesterol: 112 mg/dL (ref 0–200)
HDL: 37.5 mg/dL — ABNORMAL LOW (ref >39.00)
LDL Cholesterol: 61 mg/dL (ref 0–99)
Total CHOL/HDL Ratio: 3
Triglycerides: 67 mg/dL (ref 0.0–149.0)

## 2012-11-19 LAB — HEPATIC FUNCTION PANEL
ALT: 18 U/L (ref 0–35)
Bilirubin, Direct: 0.1 mg/dL (ref 0.0–0.3)
Total Protein: 7.3 g/dL (ref 6.0–8.3)

## 2012-11-19 LAB — IBC PANEL
Iron: 49 ug/dL (ref 42–145)
Transferrin: 385.1 mg/dL — ABNORMAL HIGH (ref 212.0–360.0)

## 2012-11-19 LAB — MICROALBUMIN / CREATININE URINE RATIO
Creatinine,U: 93.1 mg/dL
Microalb Creat Ratio: 1.2 mg/g (ref 0.0–30.0)

## 2012-11-19 LAB — URINALYSIS, ROUTINE W REFLEX MICROSCOPIC
Bilirubin Urine: NEGATIVE
Ketones, ur: NEGATIVE
Specific Gravity, Urine: 1.02 (ref 1.000–1.030)
Urine Glucose: NEGATIVE
Urobilinogen, UA: 0.2 (ref 0.0–1.0)
pH: 6 (ref 5.0–8.0)

## 2012-11-19 LAB — TSH: TSH: 4.36 u[IU]/mL (ref 0.35–5.50)

## 2012-11-19 MED ORDER — IRBESARTAN 300 MG PO TABS: 300.0000 mg | ORAL_TABLET | Freq: Every day | ORAL | Status: DC

## 2012-11-19 MED ORDER — GLIMEPIRIDE 2 MG PO TABS: 2.0000 mg | ORAL_TABLET | Freq: Every day | ORAL | Status: DC

## 2012-11-19 MED ORDER — METFORMIN HCL 1000 MG PO TABS: ORAL_TABLET | ORAL | Status: DC

## 2012-11-19 MED ORDER — PRAVASTATIN SODIUM 40 MG PO TABS: 40.0000 mg | ORAL_TABLET | Freq: Every day | ORAL | Status: DC

## 2012-11-19 MED ORDER — OMEPRAZOLE 20 MG PO CPDR: 20.0000 mg | DELAYED_RELEASE_CAPSULE | Freq: Two times a day (BID) | ORAL | Status: DC

## 2012-11-19 MED ORDER — HYDROCHLOROTHIAZIDE 25 MG PO TABS: 25.0000 mg | ORAL_TABLET | Freq: Every day | ORAL | Status: DC

## 2012-11-19 NOTE — Progress Notes (Signed)
Subjective:    Patient ID: Mary Mccarthy, female    DOB: 28-Sep-1940, 72 y.o.   MRN: [id number]  HPI  Here for wellness and f/u;  Overall doing ok;  Pt denies CP, worsening SOB, DOE, wheezing, orthopnea, PND, worsening LE edema, palpitations, dizziness or syncope.  Pt denies neurological change such as new headache, facial or extremity weakness.  Pt denies polydipsia, polyuria, or low sugar symptoms. Pt states overall good compliance with treatment and medications, good tolerability, and has been trying to follow lower cholesterol diet.  Pt denies worsening depressive symptoms, suicidal ideation or panic. No fever, night sweats, wt loss, loss of appetite, or other constitutional symptoms.  Pt states good ability with ADL's, has low fall risk, home safety reviewed and adequate, no other significant changes in hearing or vision, and only occasionally active with exercise. Has an ongoing hacking cough with ACE and resolved with not taking in the past wk.  OTC allergy meds didn't work. Past Medical History  Diagnosis Date  . Left knee DJD   . ALLERGIC RHINITIS 06/24/2007  . DEPRESSION 10/08/2006  . DIABETES MELLITUS, TYPE II 10/05/2006  . GERD 06/24/2007  . HYPERLIPIDEMIA 10/08/2006  . HYPERTENSION 10/08/2006  . PEPTIC ULCER DISEASE 10/08/2006  . COLONIC POLYPS, HX OF 06/24/2007  . INSOMNIA 09/24/2008   Past Surgical History  Procedure Laterality Date  . Breast biopsy    . Appendectomy    . Tubal ligation    . Tonsillectomy      reports that she has quit smoking. She does not have any smokeless tobacco history on file. She reports that she does not drink alcohol or use illicit drugs. family history includes Alcohol abuse in her other; Arthritis in her other; Cancer in her mother; Depression in her other; Diabetes in her other; Heart disease in her other; Hyperlipidemia in her other; Hypertension in her other; Stroke in her other. Allergies  Allergen Reactions  . Ibuprofen    Current Outpatient  Prescriptions on File Prior to Visit  Medication Sig Dispense Refill  . amLODipine (NORVASC) 10 MG tablet Take 1 tablet (10 mg total) by mouth daily.  30 tablet  11  . aspirin 81 MG EC tablet Take 81 mg by mouth daily.        . clonazePAM (KLONOPIN) 0.5 MG tablet TAKE ONE TO TWO TABLETS BY MOUTH AT NIGHT AS NEEDED FOR SLEEP  60 tablet  2  . glimepiride (AMARYL) 2 MG tablet Take 1 tablet (2 mg total) by mouth daily before breakfast.  30 tablet  11  . hydrochlorothiazide (HYDRODIURIL) 25 MG tablet Take 1 tablet (25 mg total) by mouth daily.  30 tablet  11  . lisinopril (PRINIVIL,ZESTRIL) 40 MG tablet TAKE ONE TABLET BY MOUTH EVERY DAY  30 tablet  11  . metFORMIN (GLUCOPHAGE) 1000 MG tablet 1 tab by mouth in the AM,and 1/2 tab in the PM  180 tablet  3  . omeprazole (PRILOSEC) 20 MG capsule Take 1 capsule (20 mg total) by mouth 2 (two) times daily.  60 capsule  11  . pravastatin (PRAVACHOL) 40 MG tablet Take 1 tablet (40 mg total) by mouth daily.  30 tablet  11   No current facility-administered medications on file prior to visit.   Review of Systems Constitutional: Negative for diaphoresis, activity change, appetite change or unexpected weight change.  HENT: Negative for hearing loss, ear pain, facial swelling, mouth sores and neck stiffness.   Eyes: Negative for pain, redness and visual disturbance.  Respiratory: Negative for shortness of breath and wheezing.   Cardiovascular: Negative for chest pain and palpitations.  Gastrointestinal: Negative for diarrhea, blood in stool, abdominal distention or other pain Genitourinary: Negative for hematuria, flank pain or change in urine volume.  Musculoskeletal: Negative for myalgias and joint swelling.  Skin: Negative for color change and wound.  Neurological: Negative for syncope and numbness. other than noted Hematological: Negative for adenopathy.  Psychiatric/Behavioral: Negative for hallucinations, self-injury, decreased concentration and  agitation.      Objective:   Physical Exam BP 120/70  Pulse 83  Temp(Src) 99.5 F (37.5 C) (Oral)  Ht 5\' 1"  (1.549 m)  Wt 225 lb 8 oz (102.286 kg)  BMI 42.63 kg/m2  SpO2 96% VS noted,  Constitutional: Pt is oriented to person, place, and time. Appears well-developed and well-nourished.  Head: Normocephalic and atraumatic.  Right Ear: External ear normal.  Left Ear: External ear normal.  Nose: Nose normal.  Mouth/Throat: Oropharynx is clear and moist.  Eyes: Conjunctivae and EOM are normal. Pupils are equal, round, and reactive to light.  Neck: Normal range of motion. Neck supple. No JVD present. No tracheal deviation present.  Cardiovascular: Normal rate, regular rhythm, normal heart sounds and intact distal pulses.  except for gr 1-2/6 sys murmur RUSB Pulmonary/Chest: Effort normal and breath sounds normal.  Abdominal: Soft. Bowel sounds are normal. There is no tenderness. No HSM  Musculoskeletal: Normal range of motion. Exhibits no edema.  Lymphadenopathy:  Has no cervical adenopathy.  Neurological: Pt is alert and oriented to person, place, and time. Pt has normal reflexes. No cranial nerve deficit.  Skin: Skin is warm and dry. No rash noted.  Psychiatric:  Has  normal mood and affect. Behavior is normal.     Assessment & Plan:

## 2012-11-19 NOTE — Assessment & Plan Note (Signed)
stable overall by history and exam, recent data reviewed with pt, and pt to continue medical treatment as before,  to f/u any worsening symptoms or concerns Lab Results  Component Value Date   HGBA1C 7.1* 06/27/2012

## 2012-11-19 NOTE — Assessment & Plan Note (Signed)
BP ok today, not taking the acei due to cough, ok to d/c amlod and lisinopril from med list, start avapro 300 mg qd, cont all other meds,  to f/u any worsening symptoms or concerns

## 2012-11-19 NOTE — Assessment & Plan Note (Signed)

## 2012-11-19 NOTE — Patient Instructions (Addendum)
You had the flu shot today Please return for nurse Visit in 2 wks for the Prevnar Pneumonia shot Ok to stop the amlodipine, and lisinopril as you have Please take all new medication as prescribed - the generic avapro 300 mg per day Please continue all other medications as before, and refills have been done if requested. Please have the pharmacy call with any other refills you may need Please continue your efforts at being more active, low cholesterol diet, and weight control. You are otherwise up to date with prevention measures today. Please keep your appointments with your specialists as you may have planned  Please go to the LAB in the Basement (turn left off the elevator) for the tests to be done today You will be contacted by phone if any changes need to be made immediately.  Otherwise, you will receive a letter about your results with an explanation, but please check with MyChart first.  Please remember to sign up for My Chart if you have not done so, as this will be important to you in the future with finding out test results, communicating by private email, and scheduling acute appointments online when needed.  Please return in 6 months, or sooner if needed

## 2012-12-03 ENCOUNTER — Ambulatory Visit (INDEPENDENT_AMBULATORY_CARE_PROVIDER_SITE_OTHER): Payer: Medicare Other

## 2012-12-03 DIAGNOSIS — Z23 Encounter for immunization: Secondary | ICD-10-CM

## 2012-12-09 ENCOUNTER — Other Ambulatory Visit: Payer: Self-pay | Admitting: Internal Medicine

## 2012-12-10 NOTE — Telephone Encounter (Signed)
Faxed hardcopy to [facility]

## 2012-12-10 NOTE — Telephone Encounter (Signed)
Done hardcopy to robin  

## 2012-12-30 ENCOUNTER — Ambulatory Visit: Payer: Medicare Other | Admitting: Internal Medicine

## 2013-03-11 ENCOUNTER — Other Ambulatory Visit: Payer: Self-pay

## 2013-03-11 MED ORDER — CLONAZEPAM 0.5 MG PO TABS: ORAL_TABLET | ORAL | Status: DC

## 2013-03-11 NOTE — Telephone Encounter (Signed)
Faxed hardcopy to [facility]

## 2013-03-11 NOTE — Telephone Encounter (Signed)
Done hardcopy to robin  

## 2013-05-20 ENCOUNTER — Ambulatory Visit (INDEPENDENT_AMBULATORY_CARE_PROVIDER_SITE_OTHER)
Admission: RE | Admit: 2013-05-20 | Discharge: 2013-05-20 | Disposition: A | Discharge status: Home / Self Care | Payer: Medicare Other | Source: Ambulatory Visit | Attending: Internal Medicine | Admitting: Internal Medicine

## 2013-05-20 ENCOUNTER — Other Ambulatory Visit: Payer: Self-pay | Admitting: Internal Medicine

## 2013-05-20 ENCOUNTER — Encounter: Payer: Self-pay | Admitting: Internal Medicine

## 2013-05-20 ENCOUNTER — Ambulatory Visit (INDEPENDENT_AMBULATORY_CARE_PROVIDER_SITE_OTHER): Payer: Medicare Other | Admitting: Internal Medicine

## 2013-05-20 ENCOUNTER — Other Ambulatory Visit (INDEPENDENT_AMBULATORY_CARE_PROVIDER_SITE_OTHER): Payer: Medicare Other

## 2013-05-20 VITALS — BP 140/70 | HR 90 | Temp 98.4°F | Ht 61.0 in | Wt 215.4 lb

## 2013-05-20 DIAGNOSIS — D649 Anemia, unspecified: Secondary | ICD-10-CM

## 2013-05-20 DIAGNOSIS — D509 Iron deficiency anemia, unspecified: Secondary | ICD-10-CM | POA: Insufficient documentation

## 2013-05-20 DIAGNOSIS — Z0000 Encounter for general adult medical examination without abnormal findings: Secondary | ICD-10-CM

## 2013-05-20 DIAGNOSIS — R011 Cardiac murmur, unspecified: Secondary | ICD-10-CM | POA: Insufficient documentation

## 2013-05-20 DIAGNOSIS — E119 Type 2 diabetes mellitus without complications: Secondary | ICD-10-CM

## 2013-05-20 DIAGNOSIS — I10 Essential (primary) hypertension: Secondary | ICD-10-CM

## 2013-05-20 DIAGNOSIS — E785 Hyperlipidemia, unspecified: Secondary | ICD-10-CM

## 2013-05-20 DIAGNOSIS — Z1231 Encounter for screening mammogram for malignant neoplasm of breast: Secondary | ICD-10-CM

## 2013-05-20 LAB — LIPID PANEL
Cholesterol: 105 mg/dL (ref 0–200)
HDL: 37.8 mg/dL — AB (ref >39.00)
LDL CALC: 56 mg/dL (ref 0–99)
TRIGLYCERIDES: 57 mg/dL (ref 0.0–149.0)
Total CHOL/HDL Ratio: 3
VLDL: 11.4 mg/dL (ref 0.0–40.0)

## 2013-05-20 LAB — HEPATIC FUNCTION PANEL
ALT: 17 U/L (ref 0–35)
AST: 22 U/L (ref 0–37)
Albumin: 3.6 g/dL (ref 3.5–5.2)
Alkaline Phosphatase: 57 U/L (ref 39–117)
BILIRUBIN TOTAL: 0.6 mg/dL (ref 0.3–1.2)
Bilirubin, Direct: 0.1 mg/dL (ref 0.0–0.3)
Total Protein: 7.2 g/dL (ref 6.0–8.3)

## 2013-05-20 LAB — BASIC METABOLIC PANEL
BUN: 21 mg/dL (ref 6–23)
CALCIUM: 10 mg/dL (ref 8.4–10.5)
CHLORIDE: 103 meq/L (ref 96–112)
CO2: 31 mEq/L (ref 19–32)
CREATININE: 0.8 mg/dL (ref 0.4–1.2)
GFR: 78.93 mL/min (ref >60.00)
Glucose, Bld: 127 mg/dL — ABNORMAL HIGH (ref 70–99)
Potassium: 3.9 mEq/L (ref 3.5–5.1)
Sodium: 138 mEq/L (ref 135–145)

## 2013-05-20 LAB — CBC WITH DIFFERENTIAL/PLATELET
Basophils Absolute: 0 10*3/uL (ref 0.0–0.1)
Basophils Relative: 0.1 % (ref 0.0–3.0)
Eosinophils Absolute: 0.1 10*3/uL (ref 0.0–0.7)
Eosinophils Relative: 2.8 % (ref 0.0–5.0)
HCT: 23.2 % — CL (ref 36.0–46.0)
Hemoglobin: 7.2 g/dL — CL (ref 12.0–15.0)
LYMPHS PCT: 33.2 % (ref 12.0–46.0)
Lymphs Abs: 1.6 10*3/uL (ref 0.7–4.0)
MCHC: 30.8 g/dL (ref 30.0–36.0)
MCV: 69.8 fl — ABNORMAL LOW (ref 78.0–100.0)
MONOS PCT: 9.3 % (ref 3.0–12.0)
Monocytes Absolute: 0.5 10*3/uL (ref 0.1–1.0)
Neutro Abs: 2.7 10*3/uL (ref 1.4–7.7)
Neutrophils Relative %: 54.6 % (ref 43.0–77.0)
Platelets: 238 10*3/uL (ref 150.0–400.0)
RBC: 3.33 Mil/uL — ABNORMAL LOW (ref 3.87–5.11)
RDW: 18.9 % — ABNORMAL HIGH (ref 11.5–14.6)
WBC: 4.9 10*3/uL (ref 4.5–10.5)

## 2013-05-20 LAB — HEMOGLOBIN A1C: Hgb A1c MFr Bld: 6.7 % — ABNORMAL HIGH (ref 4.6–6.5)

## 2013-05-20 LAB — IBC PANEL
IRON: 21 ug/dL — AB (ref 42–145)
Saturation Ratios: 3.8 % — ABNORMAL LOW (ref 20.0–50.0)
Transferrin: 390 mg/dL — ABNORMAL HIGH (ref 212.0–360.0)

## 2013-05-20 LAB — VITAMIN B12: VITAMIN B 12: 279 pg/mL (ref 211–911)

## 2013-05-20 LAB — FOLATE: Folate: 19.1 ng/mL (ref >5.9)

## 2013-05-20 IMAGING — CR DG CHEST 2V
2 series · 2 of 2 positions shown · non-contrast
Comparison: None.

CLINICAL DATA: Anemia

EXAM:
CHEST  2 VIEW

[view not recorded (1 of 2)]
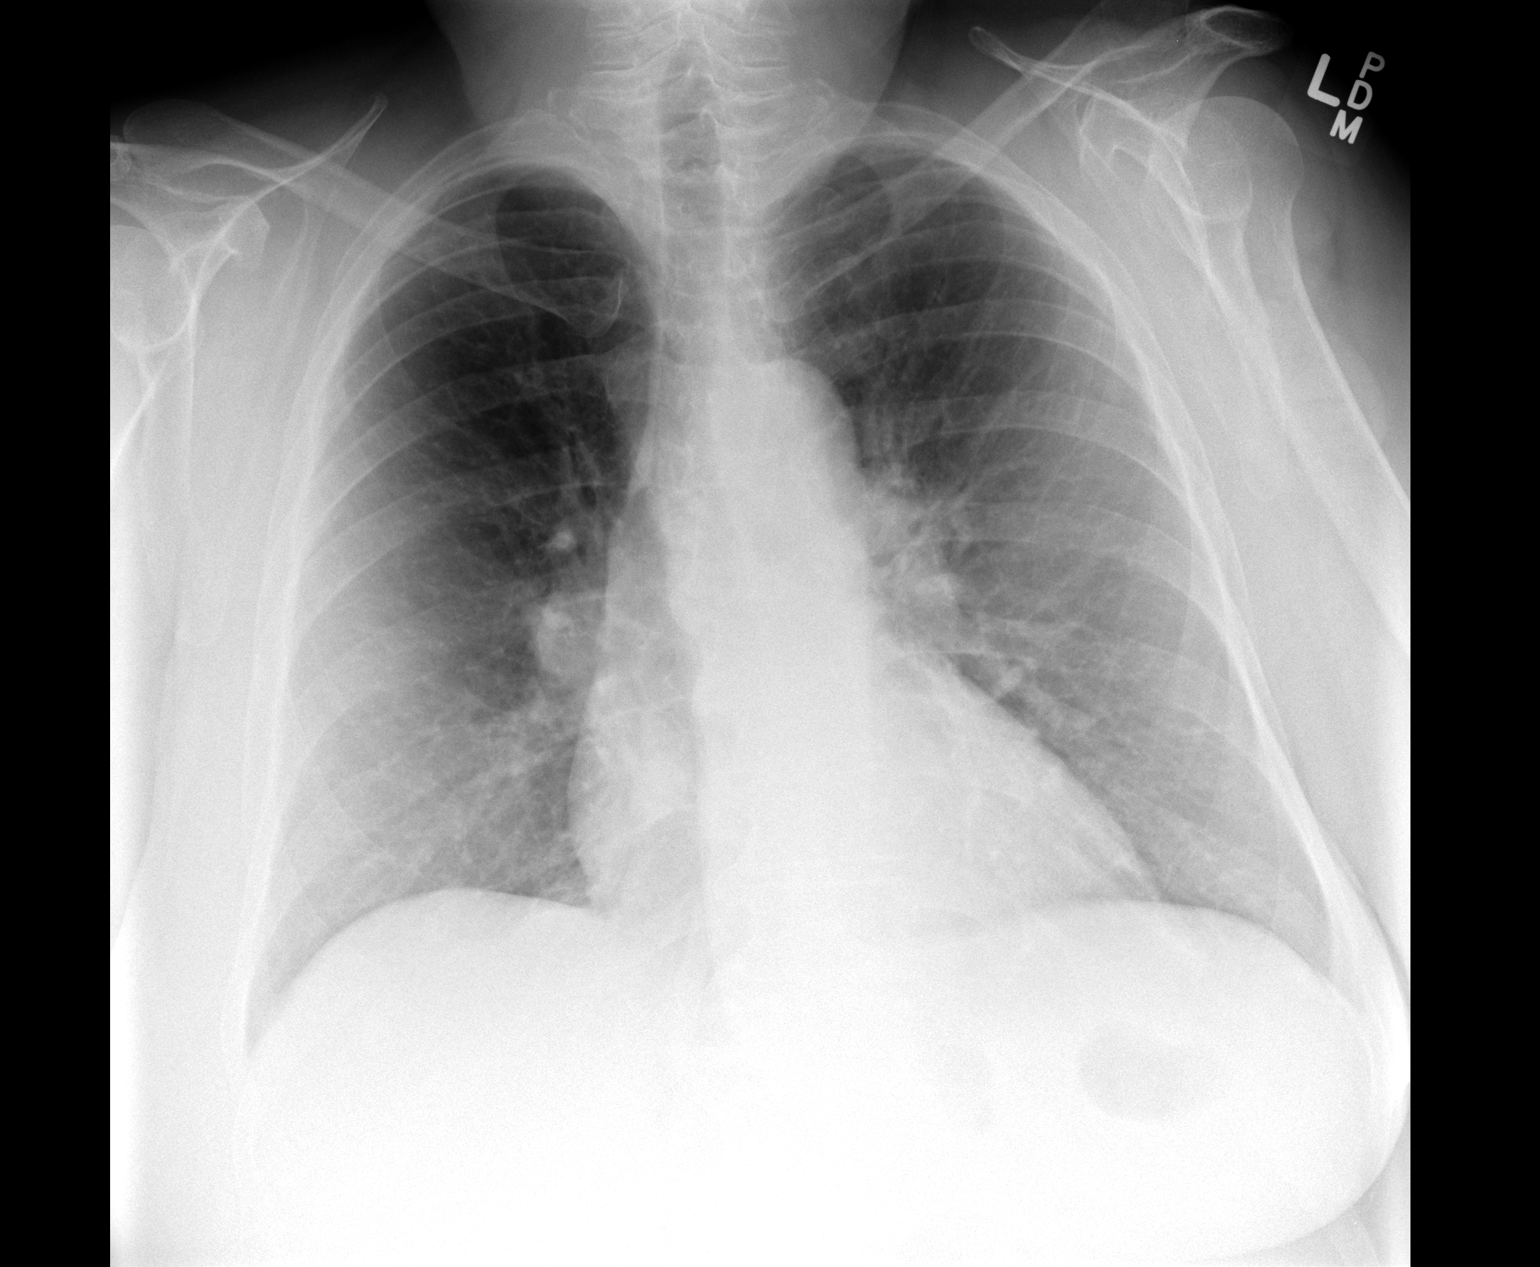

[view not recorded (2 of 2)]
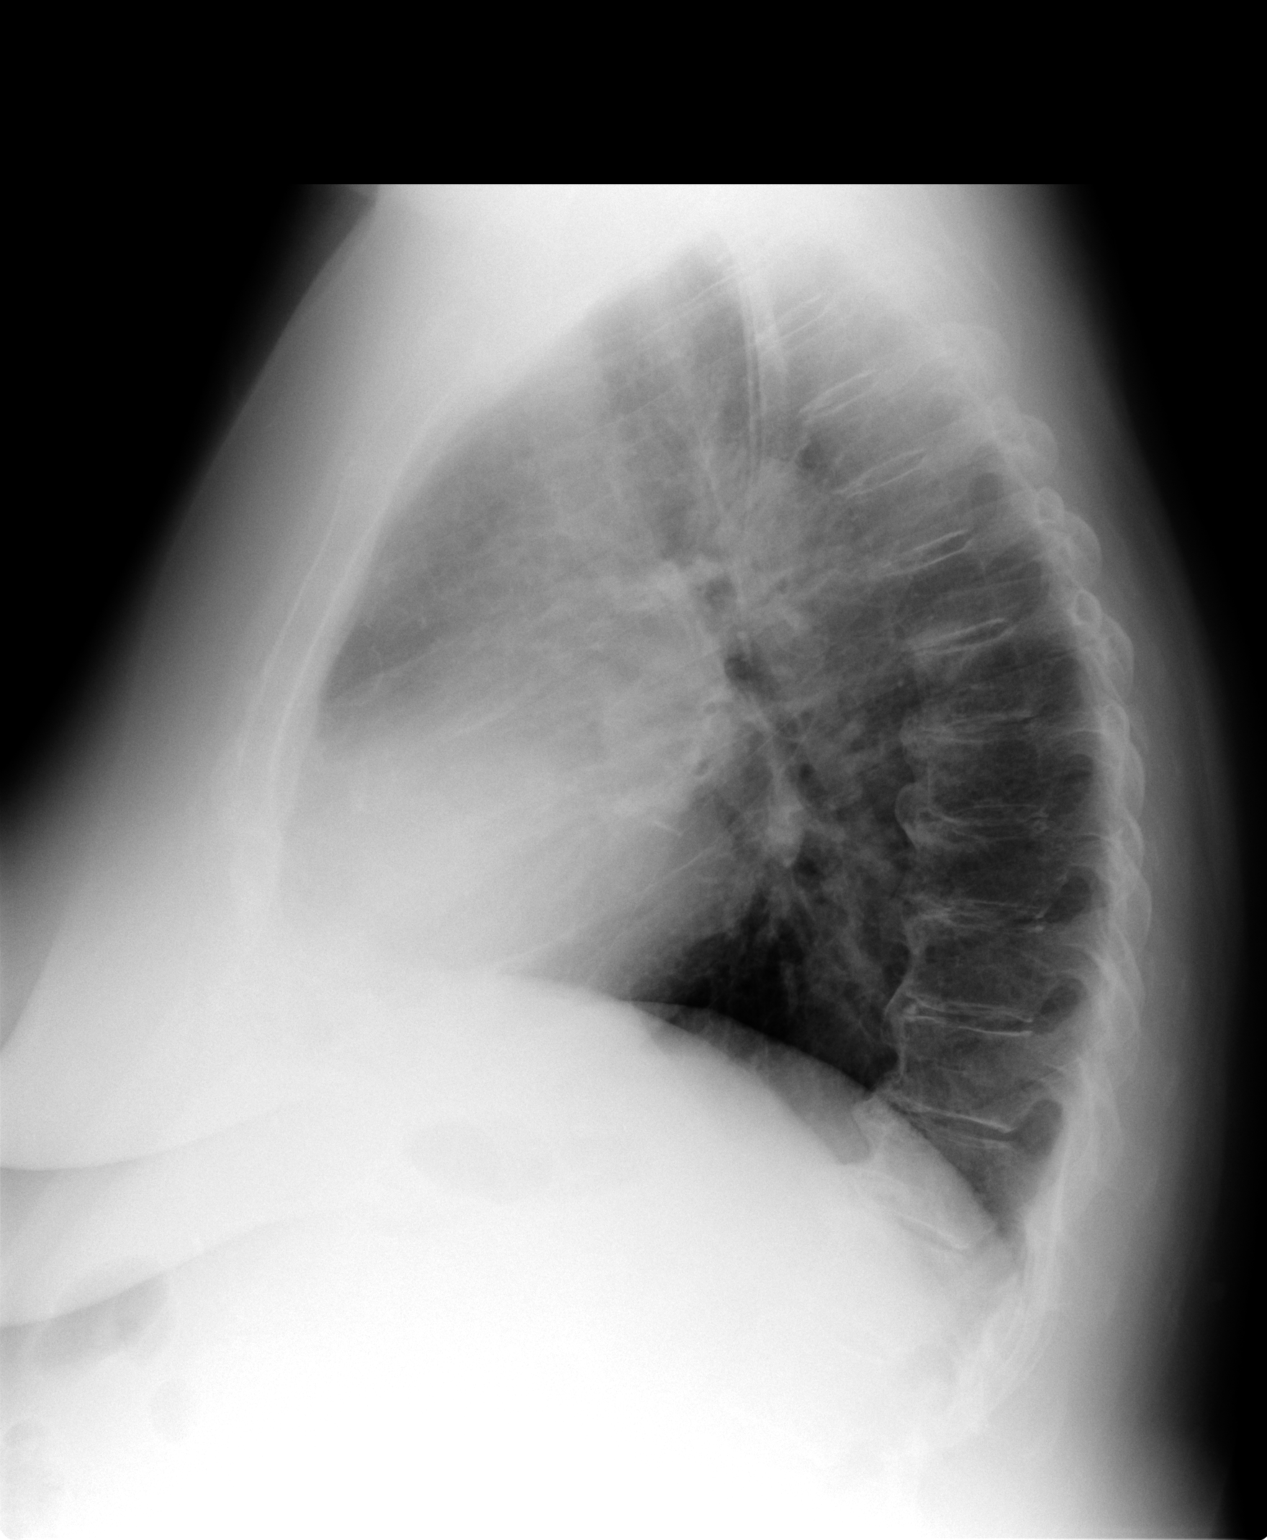

[2 of 2 positions shown; findings below may reference images not displayed]

FINDINGS: The heart size and mediastinal contours are within normal limits.
Both lungs are clear. The visualized skeletal structures are
unremarkable.
IMPRESSION: No active cardiopulmonary disease.

## 2013-05-20 MED ORDER — IRBESARTAN 75 MG PO TABS: 75.0000 mg | ORAL_TABLET | Freq: Every day | ORAL | Status: DC

## 2013-05-20 NOTE — Assessment & Plan Note (Signed)
Not taking the arb, will re-start low dose for renoprotective and BP, but also cautious with heart murmur to not exacerbate afterload reduction if has AS

## 2013-05-20 NOTE — Progress Notes (Signed)
Subjective:    Patient ID: Mary Mccarthy, female    DOB: 18-Mar-1940, 73 y.o.   MRN: [id number]  HPI   Here to f/u; overall doing ok,  Pt denies chest pain, increased sob or doe, wheezing, orthopnea, PND, increased LE swelling, palpitations, dizziness or syncope.  Pt denies polydipsia, polyuria, or low sugar symptoms such as weakness or confusion improved with po intake.  Pt denies new neurological symptoms such as new headache, or facial or extremity weakness or numbness.   Pt states overall good compliance with meds, has been trying to follow lower cholesteroldiet, with wt overall stable, but little exercise. Did have flu like illness in feb, felt poorly for almost 2 mo, now iimproved, no further dypsnea, did not go to ER, tried to hold off, felt she may have picked it up in the ER to start with as she had taken a friend there. Still has some dyspnea with strong exertion, but not much with daily activities. No dizziness.  Not taking the avapro 300 - kind of wary of taking higher dosing. Did have significant anemia last visit, iron normal. No overt bleeding or bruising, due for f/u. Last echo 2004  - normal EF Past Medical History  Diagnosis Date  . Left knee DJD   . ALLERGIC RHINITIS 06/24/2007  . DEPRESSION 10/08/2006  . DIABETES MELLITUS, TYPE II 10/05/2006  . GERD 06/24/2007  . HYPERLIPIDEMIA 10/08/2006  . HYPERTENSION 10/08/2006  . PEPTIC ULCER DISEASE 10/08/2006  . COLONIC POLYPS, HX OF 06/24/2007  . INSOMNIA 09/24/2008   Past Surgical History  Procedure Laterality Date  . Breast biopsy    . Appendectomy    . Tubal ligation    . Tonsillectomy      reports that she has quit smoking. She does not have any smokeless tobacco history on file. She reports that she does not drink alcohol or use illicit drugs. family history includes Alcohol abuse in her other; Arthritis in her other; Cancer in her mother; Depression in her other; Diabetes in her other; Heart disease in her other; Hyperlipidemia in  her other; Hypertension in her other; Stroke in her other. Allergies  Allergen Reactions  . Ibuprofen    Current Outpatient Prescriptions on File Prior to Visit  Medication Sig Dispense Refill  . aspirin 81 MG EC tablet Take 81 mg by mouth daily.        . clonazePAM (KLONOPIN) 0.5 MG tablet TAKE ONE TO TWO TABLETS BY MOUTH AT NIGHT AS NEEDED FOR SLEEP  60 tablet  2  . glimepiride (AMARYL) 2 MG tablet Take 1 tablet (2 mg total) by mouth daily before breakfast.  90 tablet  3  . hydrochlorothiazide (HYDRODIURIL) 25 MG tablet Take 1 tablet (25 mg total) by mouth daily.  90 tablet  3  . irbesartan (AVAPRO) 300 MG tablet Take 1 tablet (300 mg total) by mouth at bedtime.  90 tablet  3  . metFORMIN (GLUCOPHAGE) 1000 MG tablet 1 tab by mouth in the AM,and 1/2 tab in the PM  180 tablet  3  . omeprazole (PRILOSEC) 20 MG capsule Take 1 capsule (20 mg total) by mouth 2 (two) times daily.  180 capsule  3  . pravastatin (PRAVACHOL) 40 MG tablet Take 1 tablet (40 mg total) by mouth daily.  90 tablet  3   No current facility-administered medications on file prior to visit.    Review of Systems  Constitutional: Negative for unexpected weight change, or unusual diaphoresis  HENT: Negative  for tinnitus.   Eyes: Negative for photophobia and visual disturbance.  Respiratory: Negative for choking and stridor.   Gastrointestinal: Negative for vomiting and blood in stool.  Genitourinary: Negative for hematuria and decreased urine volume.  Musculoskeletal: Negative for acute joint swelling Skin: Negative for color change and wound.  Neurological: Negative for tremors and numbness other than noted  Psychiatric/Behavioral: Negative for decreased concentration or  hyperactivity.       Objective:   Physical Exam BP 140/70  Pulse 90  Temp(Src) 98.4 F (36.9 C) (Oral)  Ht 5\' 1"  (1.549 m)  Wt 215 lb 6 oz (97.693 kg)  BMI 40.72 kg/m2  SpO2 94% VS noted,  Constitutional: Pt appears well-developed and  well-nourished. Lavella Lemons/obese HENT: Head: NCAT.  Right Ear: External ear normal.  Left Ear: External ear normal.  Eyes: Conjunctivae and EOM are normal. Pupils are equal, round, and reactive to light.  Neck: Normal range of motion. Neck supple.  Cardiovascular: Normal rate and regular rhythm.  with gr 2-3 Syst murmur LUSB/RUSB Pulmonary/Chest: Effort normal and breath sounds normal.  Abd:  Soft, NT, non-distended, + BS Neurological: Pt is alert. Not confused  Skin: Skin is warm. No erythema.  Psychiatric: Pt behavior is normal. Thought content normal. mild nervous    Assessment & Plan:

## 2013-05-20 NOTE — Assessment & Plan Note (Signed)
stable overall by history and exam, recent data reviewed with pt, and pt to continue medical treatment as before,  to f/u any worsening symptoms or concerns Lab Results  Component Value Date   HGBA1C 6.7* 11/19/2012

## 2013-05-20 NOTE — Assessment & Plan Note (Signed)
Also could  Be element of dyspnea, for f/u lab

## 2013-05-20 NOTE — Patient Instructions (Addendum)
Please take all new medication as prescribed - the low dose avapro 75 mg per day  Please continue all other medications as before, and refills have been done if requested. Please have the pharmacy call with any other refills you may need.  Please continue your efforts at being more active, low cholesterol diet, and weight control. You are otherwise up to date with prevention measures today.  You will be contacted regarding the referral for: mammogram  Please go to the XRAY Department in the Basement (go straight as you get off the elevator) for the x-ray testing  Please go to the LAB in the Basement (turn left off the elevator) for the tests to be done today  You will be contacted regarding the referral for: echocardiogram  Please return in 6 months, or sooner if needed

## 2013-05-20 NOTE — Progress Notes (Signed)
Pre visit review using our clinic review tool, if applicable. No additional management support is needed unless otherwise documented below in the visit note. 

## 2013-05-20 NOTE — Assessment & Plan Note (Signed)
stable overall by history and exam, recent data reviewed with pt, and pt to continue medical treatment as before,  to f/u any worsening symptoms or concerns Lab Results  Component Value Date   LDLCALC 61 11/19/2012   For f/u lab

## 2013-05-20 NOTE — Assessment & Plan Note (Addendum)
Pt states had for yrs, not clear if changed, for echo re-eveal given recent dyspnea  Note:  Total time for pt hx, exam, review of record with pt in the room, determination of diagnoses and plan for further eval and tx is > 40 min, with over 50% spent in coordination and counseling of patient

## 2013-05-21 ENCOUNTER — Encounter: Payer: Self-pay | Admitting: Internal Medicine

## 2013-05-27 ENCOUNTER — Other Ambulatory Visit (INDEPENDENT_AMBULATORY_CARE_PROVIDER_SITE_OTHER): Payer: Medicare Other

## 2013-05-27 ENCOUNTER — Other Ambulatory Visit: Payer: Self-pay | Admitting: Internal Medicine

## 2013-05-27 DIAGNOSIS — D509 Iron deficiency anemia, unspecified: Secondary | ICD-10-CM

## 2013-05-27 LAB — CBC WITH DIFFERENTIAL/PLATELET
Basophils Absolute: 0 10*3/uL (ref 0.0–0.1)
Basophils Relative: 0.5 % (ref 0.0–3.0)
EOS PCT: 2.7 % (ref 0.0–5.0)
Eosinophils Absolute: 0.1 10*3/uL (ref 0.0–0.7)
Hemoglobin: 7.7 g/dL — CL (ref 12.0–15.0)
LYMPHS ABS: 1.6 10*3/uL (ref 0.7–4.0)
Lymphocytes Relative: 29.8 % (ref 12.0–46.0)
MCHC: 30.2 g/dL (ref 30.0–36.0)
MCV: 74.4 fl — ABNORMAL LOW (ref 78.0–100.0)
MONOS PCT: 7.6 % (ref 3.0–12.0)
Monocytes Absolute: 0.4 10*3/uL (ref 0.1–1.0)
Neutro Abs: 3.2 10*3/uL (ref 1.4–7.7)
Neutrophils Relative %: 59.4 % (ref 43.0–77.0)
PLATELETS: 214 10*3/uL (ref 150.0–400.0)
RBC: 3.43 Mil/uL — ABNORMAL LOW (ref 3.87–5.11)
RDW: 20.3 % — ABNORMAL HIGH (ref 11.5–14.6)
WBC: 5.3 10*3/uL (ref 4.5–10.5)

## 2013-06-03 ENCOUNTER — Ambulatory Visit
Admission: RE | Admit: 2013-06-03 | Discharge: 2013-06-03 | Disposition: A | Discharge status: Home / Self Care | Payer: Medicare Other | Source: Ambulatory Visit | Attending: Internal Medicine | Admitting: Internal Medicine

## 2013-06-03 DIAGNOSIS — Z1231 Encounter for screening mammogram for malignant neoplasm of breast: Secondary | ICD-10-CM

## 2013-06-03 LAB — HM MAMMOGRAPHY

## 2013-06-10 ENCOUNTER — Other Ambulatory Visit (INDEPENDENT_AMBULATORY_CARE_PROVIDER_SITE_OTHER): Payer: Medicare Other

## 2013-06-10 DIAGNOSIS — D509 Iron deficiency anemia, unspecified: Secondary | ICD-10-CM

## 2013-06-10 LAB — CBC WITH DIFFERENTIAL/PLATELET
BASOS ABS: 0 10*3/uL (ref 0.0–0.1)
BASOS PCT: 0.4 % (ref 0.0–3.0)
EOS PCT: 2.5 % (ref 0.0–5.0)
Eosinophils Absolute: 0.1 10*3/uL (ref 0.0–0.7)
HEMATOCRIT: 31.1 % — AB (ref 36.0–46.0)
HEMOGLOBIN: 9.6 g/dL — AB (ref 12.0–15.0)
Lymphocytes Relative: 42.2 % (ref 12.0–46.0)
Lymphs Abs: 2.2 10*3/uL (ref 0.7–4.0)
MCHC: 31 g/dL (ref 30.0–36.0)
MCV: 82.5 fl (ref 78.0–100.0)
Monocytes Absolute: 0.4 10*3/uL (ref 0.1–1.0)
Monocytes Relative: 7.3 % (ref 3.0–12.0)
NEUTROS ABS: 2.4 10*3/uL (ref 1.4–7.7)
Neutrophils Relative %: 47.6 % (ref 43.0–77.0)
Platelets: 278 10*3/uL (ref 150.0–400.0)
RBC: 3.77 Mil/uL — ABNORMAL LOW (ref 3.87–5.11)
RDW: 30.6 % — ABNORMAL HIGH (ref 11.5–15.5)
WBC: 5.1 10*3/uL (ref 4.0–10.5)

## 2013-06-18 ENCOUNTER — Telehealth: Payer: Self-pay | Admitting: Internal Medicine

## 2013-06-18 NOTE — Telephone Encounter (Signed)
Patient lvm on triage line regarding results of her last blood tests states that she never received a letter or call regarding results. Please advise.

## 2013-06-18 NOTE — Telephone Encounter (Signed)
Called informed the patient of results from 06/10/13 and PCP instructions.

## 2013-06-24 ENCOUNTER — Encounter: Payer: Self-pay | Admitting: Internal Medicine

## 2013-07-02 ENCOUNTER — Encounter: Payer: Self-pay | Admitting: Internal Medicine

## 2013-07-02 ENCOUNTER — Other Ambulatory Visit (INDEPENDENT_AMBULATORY_CARE_PROVIDER_SITE_OTHER): Payer: Medicare Other

## 2013-07-02 ENCOUNTER — Ambulatory Visit (INDEPENDENT_AMBULATORY_CARE_PROVIDER_SITE_OTHER): Payer: Medicare Other | Admitting: Internal Medicine

## 2013-07-02 VITALS — BP 154/90 | HR 79 | Temp 98.4°F | Ht 61.0 in | Wt 219.0 lb

## 2013-07-02 DIAGNOSIS — I10 Essential (primary) hypertension: Secondary | ICD-10-CM

## 2013-07-02 DIAGNOSIS — D509 Iron deficiency anemia, unspecified: Secondary | ICD-10-CM

## 2013-07-02 DIAGNOSIS — E119 Type 2 diabetes mellitus without complications: Secondary | ICD-10-CM

## 2013-07-02 DIAGNOSIS — E785 Hyperlipidemia, unspecified: Secondary | ICD-10-CM

## 2013-07-02 LAB — CBC WITH DIFFERENTIAL/PLATELET
BASOS PCT: 0.4 % (ref 0.0–3.0)
Basophils Absolute: 0 10*3/uL (ref 0.0–0.1)
EOS PCT: 2.4 % (ref 0.0–5.0)
Eosinophils Absolute: 0.1 10*3/uL (ref 0.0–0.7)
HEMATOCRIT: 34.9 % — AB (ref 36.0–46.0)
HEMOGLOBIN: 11.5 g/dL — AB (ref 12.0–15.0)
LYMPHS PCT: 42.7 % (ref 12.0–46.0)
Lymphs Abs: 2.2 10*3/uL (ref 0.7–4.0)
MCHC: 33 g/dL (ref 30.0–36.0)
MCV: 86.5 fl (ref 78.0–100.0)
MONOS PCT: 7.6 % (ref 3.0–12.0)
Monocytes Absolute: 0.4 10*3/uL (ref 0.1–1.0)
NEUTROS ABS: 2.4 10*3/uL (ref 1.4–7.7)
Neutrophils Relative %: 46.9 % (ref 43.0–77.0)
Platelets: 209 10*3/uL (ref 150.0–400.0)
RBC: 4.03 Mil/uL (ref 3.87–5.11)
RDW: 26.5 % — ABNORMAL HIGH (ref 11.5–15.5)
WBC: 5.2 10*3/uL (ref 4.0–10.5)

## 2013-07-02 MED ORDER — IRBESARTAN 150 MG PO TABS: 150.0000 mg | ORAL_TABLET | Freq: Every day | ORAL | Status: DC

## 2013-07-02 NOTE — Progress Notes (Signed)
Pre visit review using our clinic review tool, if applicable. No additional management support is needed unless otherwise documented below in the visit note. 

## 2013-07-02 NOTE — Progress Notes (Signed)
Subjective:    Patient ID: Mary Mccarthy, female    DOB: 1940-02-14, 73 y.o.   MRN: [id number]  HPI  Here to fu, c/o nausea persistent on the iron but still taking twice per day, Pt denies chest pain, increased sob or doe, wheezing, orthopnea, PND, increased LE swelling, palpitations, dizziness or syncope.  No overt bleeding.  Not yet seen GI.  Last colonscopy approx 2 yrs Denies worsening reflux, abd pain, dysphagia, n/v, bowel change or blood.  Pt denies chest pain, increased sob or doe, wheezing, orthopnea, PND, increased LE swelling, palpitations, dizziness or syncope.  Past Medical History  Diagnosis Date  . Left knee DJD   . ALLERGIC RHINITIS 06/24/2007  . DEPRESSION 10/08/2006  . DIABETES MELLITUS, TYPE II 10/05/2006  . GERD 06/24/2007  . HYPERLIPIDEMIA 10/08/2006  . HYPERTENSION 10/08/2006  . PEPTIC ULCER DISEASE 10/08/2006  . COLONIC POLYPS, HX OF 06/24/2007  . INSOMNIA 09/24/2008  . Anemia   . Heart murmur    Past Surgical History  Procedure Laterality Date  . Breast biopsy    . Appendectomy    . Tubal ligation    . Tonsillectomy      reports that she has quit smoking. She does not have any smokeless tobacco history on file. She reports that she does not drink alcohol or use illicit drugs. family history includes Alcohol abuse in her other; Arthritis in her other; Cancer in her mother; Depression in her other; Diabetes in her other; Heart disease in her other; Hyperlipidemia in her other; Hypertension in her other; Stroke in her other. Allergies  Allergen Reactions  . Ibuprofen    Current Outpatient Prescriptions on File Prior to Visit  Medication Sig Dispense Refill  . aspirin 81 MG EC tablet Take 81 mg by mouth daily.        . clonazePAM (KLONOPIN) 0.5 MG tablet TAKE ONE TO TWO TABLETS BY MOUTH AT NIGHT AS NEEDED FOR SLEEP  60 tablet  2  . glimepiride (AMARYL) 2 MG tablet Take 1 tablet (2 mg total) by mouth daily before breakfast.  90 tablet  3  . hydrochlorothiazide  (HYDRODIURIL) 25 MG tablet Take 1 tablet (25 mg total) by mouth daily.  90 tablet  3  . irbesartan (AVAPRO) 75 MG tablet Take 1 tablet (75 mg total) by mouth daily.  90 tablet  3  . metFORMIN (GLUCOPHAGE) 1000 MG tablet 1 tab by mouth in the AM,and 1/2 tab in the PM  180 tablet  3  . omeprazole (PRILOSEC) 20 MG capsule Take 1 capsule (20 mg total) by mouth 2 (two) times daily.  180 capsule  3  . pravastatin (PRAVACHOL) 40 MG tablet Take 1 tablet (40 mg total) by mouth daily.  90 tablet  3   No current facility-administered medications on file prior to visit.   Review of Systems  Constitutional: Negative for unusual diaphoresis or other sweats  HENT: Negative for ringing in ear Eyes: Negative for double vision or worsening visual disturbance.  Respiratory: Negative for choking and stridor.   Gastrointestinal: Negative for vomiting or other signifcant bowel change Genitourinary: Negative for hematuria or decreased urine volume.  Musculoskeletal: Negative for other MSK pain or swelling Skin: Negative for color change and worsening wound.  Neurological: Negative for tremors and numbness other than noted  Psychiatric/Behavioral: Negative for decreased concentration or agitation other than above       Objective:   Physical Exam BP 154/90  Pulse 79  Temp(Src) 98.4 F (36.9  C) (Oral)  Ht 5\' 1"  (1.549 m)  Wt 219 lb (99.338 kg)  BMI 41.40 kg/m2  SpO2 94% VS noted,  Constitutional: Pt appears well-developed, well-nourished.  HENT: Head: NCAT.  Right Ear: External ear normal.  Left Ear: External ear normal.  Eyes: . Pupils are equal, round, and reactive to light. Conjunctivae and EOM are normal Neck: Normal range of motion. Neck supple.  Cardiovascular: Normal rate and regular rhythm.   Pulmonary/Chest: Effort normal and breath sounds normal.  Abd:  Soft, NT, ND, + BS Neurological: Pt is alert. Not confused , motor grossly intact Skin: Skin is warm. No rash Psychiatric: Pt behavior is  normal. No agitation.     Assessment & Plan:

## 2013-07-02 NOTE — Progress Notes (Signed)
   Subjective:    Patient ID: Mary Mccarthy, female    DOB: Jun 15, 1940, 73 y.o.   MRN: [id number]  HPI    Review of Systems     Objective:   Physical Exam        Assessment & Plan:

## 2013-07-02 NOTE — Patient Instructions (Signed)
Ok to increase the avapro to 150 mg per day  OK to decrease the iron to one per day  Please continue all other medications as before, and refills have been done if requested. Please have the pharmacy call with any other refills you may need.  Please keep your appointments with your specialists as you have planned - GI  Please go to the LAB in the Basement (turn left off the elevator) for the tests to be done today  You will be contacted by phone if any changes need to be made immediately.  Otherwise, you will receive a letter about your results with an explanation, but please check with MyChart first.  Please remember to sign up for MyChart if you have not done so, as this will be important to you in the future with finding out test results, communicating by private email, and scheduling acute appointments online when needed.

## 2013-07-02 NOTE — Assessment & Plan Note (Signed)
stable overall by history and exam, recent data reviewed with pt, and pt to continue medical treatment as before,  to f/u any worsening symptoms or concerns Lab Results  Component Value Date   LDLCALC 56 05/20/2013    

## 2013-07-02 NOTE — Assessment & Plan Note (Addendum)
Mild uncontrolled, for increased avapo to 150 qd, f/u BP at home and next visit  BP Readings from Last 3 Encounters:  07/02/13 154/90  05/20/13 140/70  11/19/12 120/70

## 2013-07-02 NOTE — Assessment & Plan Note (Signed)
Improved recent, ok to decr the iron to 1 qd to hopefully help with the nausea, f/u cbc today, cont GI eval as referred

## 2013-07-02 NOTE — Assessment & Plan Note (Signed)
stable overall by history and exam, recent data reviewed with pt, and pt to continue medical treatment as before,  to f/u any worsening symptoms or concerns Lab Results  Component Value Date   HGBA1C 6.7* 05/20/2013

## 2013-07-03 ENCOUNTER — Telehealth: Payer: Self-pay | Admitting: Internal Medicine

## 2013-07-03 NOTE — Telephone Encounter (Signed)
Relevant patient education mailed to patient.  

## 2013-07-04 ENCOUNTER — Other Ambulatory Visit: Payer: Self-pay

## 2013-07-04 MED ORDER — PRAVASTATIN SODIUM 40 MG PO TABS: 40.0000 mg | ORAL_TABLET | Freq: Every day | ORAL | Status: DC

## 2013-07-04 MED ORDER — HYDROCHLOROTHIAZIDE 25 MG PO TABS: 25.0000 mg | ORAL_TABLET | Freq: Every day | ORAL | Status: DC

## 2013-07-04 MED ORDER — OMEPRAZOLE 20 MG PO CPDR: 20.0000 mg | DELAYED_RELEASE_CAPSULE | Freq: Two times a day (BID) | ORAL | Status: DC

## 2013-07-04 MED ORDER — CLONAZEPAM 0.5 MG PO TABS: ORAL_TABLET | ORAL | Status: DC

## 2013-07-04 NOTE — Addendum Note (Signed)
Addended by: Scharlene Gloss B on: 07/04/2013 10:16 AM   Modules accepted: Orders

## 2013-07-04 NOTE — Telephone Encounter (Signed)
Done hardcopy to robin  

## 2013-07-04 NOTE — Addendum Note (Signed)
Addended by: Scharlene Gloss B on: 07/04/2013 10:26 AM   Modules accepted: Orders

## 2013-07-04 NOTE — Telephone Encounter (Signed)
Faxed hardcopy to [facility]

## 2013-07-09 ENCOUNTER — Ambulatory Visit: Payer: Medicare Other | Admitting: Internal Medicine

## 2013-09-02 ENCOUNTER — Ambulatory Visit: Payer: Medicare Other | Admitting: Internal Medicine

## 2013-09-30 ENCOUNTER — Other Ambulatory Visit: Payer: Self-pay

## 2013-09-30 MED ORDER — GLIMEPIRIDE 2 MG PO TABS: 2.0000 mg | ORAL_TABLET | Freq: Every day | ORAL | Status: DC

## 2013-10-22 ENCOUNTER — Telehealth: Payer: Self-pay

## 2013-10-22 ENCOUNTER — Ambulatory Visit: Payer: Medicare Other

## 2013-10-22 VITALS — BP 150/68

## 2013-10-22 DIAGNOSIS — Z0130 Encounter for examination of blood pressure without abnormal findings: Secondary | ICD-10-CM

## 2013-10-22 NOTE — Telephone Encounter (Signed)
Please make ROV, and bring any BP machine at home as well

## 2013-10-22 NOTE — Telephone Encounter (Signed)
n

## 2013-10-23 NOTE — Telephone Encounter (Signed)
Patient does not have a bp cuff at home.  She does have an appointment on 10/13 and would prefer to talk about her bp then .

## 2013-10-29 ENCOUNTER — Other Ambulatory Visit: Payer: Self-pay

## 2013-10-29 MED ORDER — CLONAZEPAM 0.5 MG PO TABS: ORAL_TABLET | ORAL | Status: DC

## 2013-10-29 NOTE — Telephone Encounter (Signed)
Faxed hardcopy for Clonazepam to [facility]

## 2013-10-29 NOTE — Telephone Encounter (Signed)
Done hardcopy to robin  

## 2013-11-18 ENCOUNTER — Other Ambulatory Visit (INDEPENDENT_AMBULATORY_CARE_PROVIDER_SITE_OTHER): Payer: Medicare Other

## 2013-11-18 ENCOUNTER — Ambulatory Visit (INDEPENDENT_AMBULATORY_CARE_PROVIDER_SITE_OTHER): Payer: Medicare Other | Admitting: Internal Medicine

## 2013-11-18 ENCOUNTER — Encounter: Payer: Self-pay | Admitting: Internal Medicine

## 2013-11-18 VITALS — BP 130/72 | HR 73 | Temp 98.0°F | Ht 61.0 in | Wt 222.1 lb

## 2013-11-18 DIAGNOSIS — Z0000 Encounter for general adult medical examination without abnormal findings: Secondary | ICD-10-CM

## 2013-11-18 DIAGNOSIS — E785 Hyperlipidemia, unspecified: Secondary | ICD-10-CM

## 2013-11-18 DIAGNOSIS — E119 Type 2 diabetes mellitus without complications: Secondary | ICD-10-CM

## 2013-11-18 DIAGNOSIS — Z23 Encounter for immunization: Secondary | ICD-10-CM

## 2013-11-18 DIAGNOSIS — I10 Essential (primary) hypertension: Secondary | ICD-10-CM

## 2013-11-18 DIAGNOSIS — R011 Cardiac murmur, unspecified: Secondary | ICD-10-CM

## 2013-11-18 LAB — URINALYSIS, ROUTINE W REFLEX MICROSCOPIC
BILIRUBIN URINE: NEGATIVE
Hgb urine dipstick: NEGATIVE
Ketones, ur: NEGATIVE
LEUKOCYTES UA: NEGATIVE
NITRITE: NEGATIVE
RBC / HPF: NONE SEEN (ref 0–?)
Specific Gravity, Urine: <=1.005 — AB (ref 1.000–1.030)
Total Protein, Urine: NEGATIVE
UROBILINOGEN UA: 0.2 (ref 0.0–1.0)
Urine Glucose: NEGATIVE
pH: 6.5 (ref 5.0–8.0)

## 2013-11-18 LAB — CBC WITH DIFFERENTIAL/PLATELET
BASOS ABS: 0 10*3/uL (ref 0.0–0.1)
Basophils Relative: 0.4 % (ref 0.0–3.0)
Eosinophils Absolute: 0.1 10*3/uL (ref 0.0–0.7)
Eosinophils Relative: 2.7 % (ref 0.0–5.0)
HEMATOCRIT: 37 % (ref 36.0–46.0)
Hemoglobin: 12.5 g/dL (ref 12.0–15.0)
LYMPHS ABS: 2 10*3/uL (ref 0.7–4.0)
Lymphocytes Relative: 44.1 % (ref 12.0–46.0)
MCHC: 33.6 g/dL (ref 30.0–36.0)
MCV: 96.2 fl (ref 78.0–100.0)
MONO ABS: 0.4 10*3/uL (ref 0.1–1.0)
Monocytes Relative: 8.2 % (ref 3.0–12.0)
NEUTROS PCT: 44.6 % (ref 43.0–77.0)
Neutro Abs: 2.1 10*3/uL (ref 1.4–7.7)
Platelets: 203 10*3/uL (ref 150.0–400.0)
RBC: 3.85 Mil/uL — ABNORMAL LOW (ref 3.87–5.11)
RDW: 13.1 % (ref 11.5–15.5)
WBC: 4.6 10*3/uL (ref 4.0–10.5)

## 2013-11-18 LAB — BASIC METABOLIC PANEL
BUN: 17 mg/dL (ref 6–23)
CO2: 28 meq/L (ref 19–32)
Calcium: 10.2 mg/dL (ref 8.4–10.5)
Chloride: 103 mEq/L (ref 96–112)
Creatinine, Ser: 0.8 mg/dL (ref 0.4–1.2)
GFR: 70.23 mL/min (ref >60.00)
Glucose, Bld: 118 mg/dL — ABNORMAL HIGH (ref 70–99)
POTASSIUM: 4.3 meq/L (ref 3.5–5.1)
SODIUM: 138 meq/L (ref 135–145)

## 2013-11-18 LAB — HEMOGLOBIN A1C: Hgb A1c MFr Bld: 6.1 % (ref 4.6–6.5)

## 2013-11-18 LAB — HEPATIC FUNCTION PANEL
ALBUMIN: 3.4 g/dL — AB (ref 3.5–5.2)
ALK PHOS: 53 U/L (ref 39–117)
ALT: 20 U/L (ref 0–35)
AST: 24 U/L (ref 0–37)
Bilirubin, Direct: 0.1 mg/dL (ref 0.0–0.3)
Total Bilirubin: 1.1 mg/dL (ref 0.2–1.2)
Total Protein: 7.4 g/dL (ref 6.0–8.3)

## 2013-11-18 LAB — LIPID PANEL
CHOLESTEROL: 116 mg/dL (ref 0–200)
HDL: 37 mg/dL — ABNORMAL LOW (ref >39.00)
LDL CALC: 65 mg/dL (ref 0–99)
NonHDL: 79
TRIGLYCERIDES: 68 mg/dL (ref 0.0–149.0)
Total CHOL/HDL Ratio: 3
VLDL: 13.6 mg/dL (ref 0.0–40.0)

## 2013-11-18 LAB — MICROALBUMIN / CREATININE URINE RATIO
Creatinine,U: 43 mg/dL
MICROALB UR: 0.4 mg/dL (ref 0.0–1.9)
MICROALB/CREAT RATIO: 0.9 mg/g (ref 0.0–30.0)

## 2013-11-18 LAB — TSH: TSH: 5.06 u[IU]/mL — AB (ref 0.35–4.50)

## 2013-11-18 NOTE — Progress Notes (Signed)
Subjective:    Patient ID: Mary Mccarthy, female    DOB: 11/27/1940, 73 y.o.   MRN: [id number]  HPI  Here for wellness and f/u;  Overall doing ok;  Pt denies CP, worsening SOB, DOE, wheezing, orthopnea, PND, worsening LE edema, palpitations, dizziness or syncope.  Pt denies neurological change such as new headache, facial or extremity weakness.  Pt denies polydipsia, polyuria, or low sugar symptoms. Pt states overall good compliance with treatment and medications, good tolerability, and has been trying to follow lower cholesterol diet.  Pt denies worsening depressive symptoms, suicidal ideation or panic. No fever, night sweats, wt loss, loss of appetite, or other constitutional symptoms.  Pt states good ability with ADL's, has low fall risk, home safety reviewed and adequate, no other significant changes in hearing or vision, and only occasionally active with exercise.  Was not able to see GI in last 6 mo with new iron def anemia, due to cost.  Pt hopes for better insurance coverage soon.  Due for flu shot today.  Has been taking iron only a few times per wk recently due to diarrhea. Stress dealing with husbnad as well. Did some painting approx 1 wk ago,   Past Medical History  Diagnosis Date  . Left knee DJD   . ALLERGIC RHINITIS 06/24/2007  . DEPRESSION 10/08/2006  . DIABETES MELLITUS, TYPE II 10/05/2006  . GERD 06/24/2007  . HYPERLIPIDEMIA 10/08/2006  . HYPERTENSION 10/08/2006  . PEPTIC ULCER DISEASE 10/08/2006  . COLONIC POLYPS, HX OF 06/24/2007  . INSOMNIA 09/24/2008  . Anemia   . Heart murmur    Past Surgical History  Procedure Laterality Date  . Breast biopsy    . Appendectomy    . Tubal ligation    . Tonsillectomy      reports that she has quit smoking. She does not have any smokeless tobacco history on file. She reports that she does not drink alcohol or use illicit drugs. family history includes Alcohol abuse in her other; Arthritis in her other; Cancer in her mother; Depression in her  other; Diabetes in her other; Heart disease in her other; Hyperlipidemia in her other; Hypertension in her other; Stroke in her other. Allergies  Allergen Reactions  . Ibuprofen    Current Outpatient Prescriptions on File Prior to Visit  Medication Sig Dispense Refill  . aspirin 81 MG EC tablet Take 81 mg by mouth daily.        . clonazePAM (KLONOPIN) 0.5 MG tablet TAKE ONE TO TWO TABLETS BY MOUTH AT NIGHT AS NEEDED FOR SLEEP  60 tablet  2  . glimepiride (AMARYL) 2 MG tablet Take 1 tablet (2 mg total) by mouth daily before breakfast.  30 tablet  11  . hydrochlorothiazide (HYDRODIURIL) 25 MG tablet Take 1 tablet (25 mg total) by mouth daily.  30 tablet  11  . irbesartan (AVAPRO) 150 MG tablet Take 1 tablet (150 mg total) by mouth daily.  90 tablet  3  . metFORMIN (GLUCOPHAGE) 1000 MG tablet 1 tab by mouth in the AM,and 1/2 tab in the PM  180 tablet  3  . omeprazole (PRILOSEC) 20 MG capsule Take 1 capsule (20 mg total) by mouth 2 (two) times daily.  60 capsule  11  . pravastatin (PRAVACHOL) 40 MG tablet Take 1 tablet (40 mg total) by mouth daily.  30 tablet  11   No current facility-administered medications on file prior to visit.   Review of Systems Constitutional: Negative for increased diaphoresis,  other activity, appetite or other siginficant weight change  HENT: Negative for worsening hearing loss, ear pain, facial swelling, mouth sores and neck stiffness.   Eyes: Negative for other worsening pain, redness or visual disturbance.  Respiratory: Negative for shortness of breath and wheezing.   Cardiovascular: Negative for chest pain and palpitations.  Gastrointestinal: Negative for diarrhea, blood in stool, abdominal distention or other pain Genitourinary: Negative for hematuria, flank pain or change in urine volume.  Musculoskeletal: Negative for myalgias or other joint complaints.  Skin: Negative for color change and wound.  Neurological: Negative for syncope and numbness. other than  noted Hematological: Negative for adenopathy. or other swelling Psychiatric/Behavioral: Negative for hallucinations, self-injury, decreased concentration or other worsening agitation.      Objective:   Physical Exam BP 130/72  Pulse 73  Temp(Src) 98 F (36.7 C) (Oral)  Ht 5\' 1"  (1.549 m)  Wt 222 lb 2 oz (100.755 kg)  BMI 41.99 kg/m2  SpO2 96% VS noted,  Constitutional: Pt is oriented to person, place, and time. Appears well-developed and well-nourished.  Head: Normocephalic and atraumatic.  Right Ear: External ear normal.  Left Ear: External ear normal.  Nose: Nose normal.  Mouth/Throat: Oropharynx is clear and moist.  Eyes: Conjunctivae and EOM are normal. Pupils are equal, round, and reactive to light.  Neck: Normal range of motion. Neck supple. No JVD present. No tracheal deviation present.  Cardiovascular: Normal rate, regular rhythm, normal heart sounds and intact distal pulses.   Pulmonary/Chest: Effort normal and breath sounds without rales or wheezing  Abdominal: Soft. Bowel sounds are normal. NT. No HSM  Musculoskeletal: Normal range of motion. Exhibits no edema.  Lymphadenopathy:  Has no cervical adenopathy.  Neurological: Pt is alert and oriented to person, place, and time. Pt has normal reflexes. No cranial nerve deficit. Motor grossly intact Skin: Skin is warm and dry. No rash noted.  Psychiatric:  Has normal mood and affect. Behavior is normal. Mild nervous     Assessment & Plan:

## 2013-11-18 NOTE — Progress Notes (Signed)
Pre visit review using our clinic review tool, if applicable. No additional management support is needed unless otherwise documented below in the visit note. 

## 2013-11-18 NOTE — Assessment & Plan Note (Signed)
stable overall by history and exam, recent data reviewed with pt, and pt to continue medical treatment as before,  to f/u any worsening symptoms or concerns Lab Results  Component Value Date   HGBA1C 6.7* 05/20/2013    

## 2013-11-18 NOTE — Assessment & Plan Note (Signed)
stable overall by history and exam, recent data reviewed with pt, and pt to continue medical treatment as before,  to f/u any worsening symptoms or concerns Lab Results  Component Value Date   LDLCALC 56 05/20/2013

## 2013-11-18 NOTE — Assessment & Plan Note (Signed)
stable overall by history and exam, recent data reviewed with pt, and pt to continue medical treatment as before,  to f/u any worsening symptoms or concerns BP Readings from Last 3 Encounters:  11/18/13 130/72  10/22/13 150/68  07/02/13 154/90

## 2013-11-18 NOTE — Assessment & Plan Note (Signed)
Did not have echo last visit, ok to re-order, has mild ongoing sob - ? Obesity vs other

## 2013-11-18 NOTE — Assessment & Plan Note (Signed)

## 2013-11-18 NOTE — Patient Instructions (Addendum)
You had the flu shot today  Please continue all other medications as before, and refills have been done if requested.  Please have the pharmacy call with any other refills you may need.  Please continue your efforts at being more active, low cholesterol diet, and weight control.  You are otherwise up to date with prevention measures today.  Please keep your appointments with your specialists as you may have planned  You will be contacted regarding the referral for: echocardiogram  Please go to the LAB in the Basement (turn left off the elevator) for the tests to be done today  You will be contacted by phone if any changes need to be made immediately.  Otherwise, you will receive a letter about your results with an explanation, but please check with MyChart first.  Please remember to sign up for MyChart if you have not done so, as this will be important to you in the future with finding out test results, communicating by private email, and scheduling acute appointments online when needed.  Please return in 6 months, or sooner if needed, with Lab testing done 3-5 days before\

## 2013-11-24 ENCOUNTER — Other Ambulatory Visit: Payer: Self-pay

## 2013-11-24 MED ORDER — METFORMIN HCL 1000 MG PO TABS: ORAL_TABLET | ORAL | Status: DC

## 2013-11-27 ENCOUNTER — Other Ambulatory Visit (HOSPITAL_COMMUNITY): Payer: Self-pay | Admitting: Internal Medicine

## 2013-11-27 DIAGNOSIS — R011 Cardiac murmur, unspecified: Secondary | ICD-10-CM

## 2013-11-28 ENCOUNTER — Ambulatory Visit (HOSPITAL_COMMUNITY): Payer: Medicare Other | Attending: Cardiology | Admitting: Cardiology

## 2013-11-28 DIAGNOSIS — I10 Essential (primary) hypertension: Secondary | ICD-10-CM | POA: Diagnosis not present

## 2013-11-28 DIAGNOSIS — E119 Type 2 diabetes mellitus without complications: Secondary | ICD-10-CM | POA: Insufficient documentation

## 2013-11-28 DIAGNOSIS — E785 Hyperlipidemia, unspecified: Secondary | ICD-10-CM | POA: Diagnosis not present

## 2013-11-28 DIAGNOSIS — R011 Cardiac murmur, unspecified: Secondary | ICD-10-CM | POA: Diagnosis not present

## 2013-11-28 NOTE — Progress Notes (Signed)
Echo performed. 

## 2014-02-04 ENCOUNTER — Other Ambulatory Visit: Payer: Self-pay

## 2014-02-04 MED ORDER — CLONAZEPAM 0.5 MG PO TABS: ORAL_TABLET | ORAL | Status: DC

## 2014-02-04 NOTE — Telephone Encounter (Signed)
Faxed hardcopy for clonazepam to [facility]

## 2014-02-04 NOTE — Telephone Encounter (Signed)
Done hardcopy to robin  

## 2014-05-21 ENCOUNTER — Ambulatory Visit (INDEPENDENT_AMBULATORY_CARE_PROVIDER_SITE_OTHER): Payer: Medicare Other | Admitting: Internal Medicine

## 2014-05-21 ENCOUNTER — Ambulatory Visit (INDEPENDENT_AMBULATORY_CARE_PROVIDER_SITE_OTHER)
Admission: RE | Admit: 2014-05-21 | Discharge: 2014-05-21 | Disposition: A | Discharge status: Home / Self Care | Payer: Medicare Other | Source: Ambulatory Visit | Attending: Internal Medicine | Admitting: Internal Medicine

## 2014-05-21 ENCOUNTER — Encounter: Payer: Self-pay | Admitting: Internal Medicine

## 2014-05-21 ENCOUNTER — Other Ambulatory Visit (INDEPENDENT_AMBULATORY_CARE_PROVIDER_SITE_OTHER): Payer: Medicare Other

## 2014-05-21 VITALS — BP 134/80 | HR 76 | Temp 97.8°F | Resp 18 | Ht 61.0 in | Wt 224.0 lb

## 2014-05-21 DIAGNOSIS — E119 Type 2 diabetes mellitus without complications: Secondary | ICD-10-CM

## 2014-05-21 DIAGNOSIS — Z0189 Encounter for other specified special examinations: Secondary | ICD-10-CM

## 2014-05-21 DIAGNOSIS — I10 Essential (primary) hypertension: Secondary | ICD-10-CM | POA: Diagnosis not present

## 2014-05-21 DIAGNOSIS — E785 Hyperlipidemia, unspecified: Secondary | ICD-10-CM

## 2014-05-21 DIAGNOSIS — M1711 Unilateral primary osteoarthritis, right knee: Secondary | ICD-10-CM | POA: Diagnosis not present

## 2014-05-21 DIAGNOSIS — M25561 Pain in right knee: Secondary | ICD-10-CM

## 2014-05-21 DIAGNOSIS — Z0000 Encounter for general adult medical examination without abnormal findings: Secondary | ICD-10-CM

## 2014-05-21 LAB — HEPATIC FUNCTION PANEL
ALK PHOS: 60 U/L (ref 39–117)
ALT: 18 U/L (ref 0–35)
AST: 19 U/L (ref 0–37)
Albumin: 3.7 g/dL (ref 3.5–5.2)
Bilirubin, Direct: 0.2 mg/dL (ref 0.0–0.3)
Total Bilirubin: 0.7 mg/dL (ref 0.2–1.2)
Total Protein: 7.1 g/dL (ref 6.0–8.3)

## 2014-05-21 LAB — HEMOGLOBIN A1C: Hgb A1c MFr Bld: 6.9 % — ABNORMAL HIGH (ref 4.6–6.5)

## 2014-05-21 LAB — BASIC METABOLIC PANEL
BUN: 24 mg/dL — ABNORMAL HIGH (ref 6–23)
CALCIUM: 10.2 mg/dL (ref 8.4–10.5)
CO2: 29 mEq/L (ref 19–32)
Chloride: 103 mEq/L (ref 96–112)
Creatinine, Ser: 0.8 mg/dL (ref 0.40–1.20)
GFR: 74.2 mL/min (ref >60.00)
Glucose, Bld: 144 mg/dL — ABNORMAL HIGH (ref 70–99)
POTASSIUM: 4.2 meq/L (ref 3.5–5.1)
SODIUM: 139 meq/L (ref 135–145)

## 2014-05-21 LAB — LIPID PANEL
CHOL/HDL RATIO: 3
Cholesterol: 97 mg/dL (ref 0–200)
HDL: 34.3 mg/dL — ABNORMAL LOW (ref >39.00)
LDL CALC: 46 mg/dL (ref 0–99)
NonHDL: 62.7
Triglycerides: 86 mg/dL (ref 0.0–149.0)
VLDL: 17.2 mg/dL (ref 0.0–40.0)

## 2014-05-21 IMAGING — CR DG KNEE COMPLETE 4+V*R*
4 series · 4 of 4 positions shown · non-contrast
Comparison: None.

CLINICAL DATA: 73-year-old female with posterior knee pain and
lower leg pain for 3 weeks. No recent injury. Initial encounter.

EXAM:
RIGHT KNEE - COMPLETE 4+ VIEW

[view not recorded (1 of 4)]
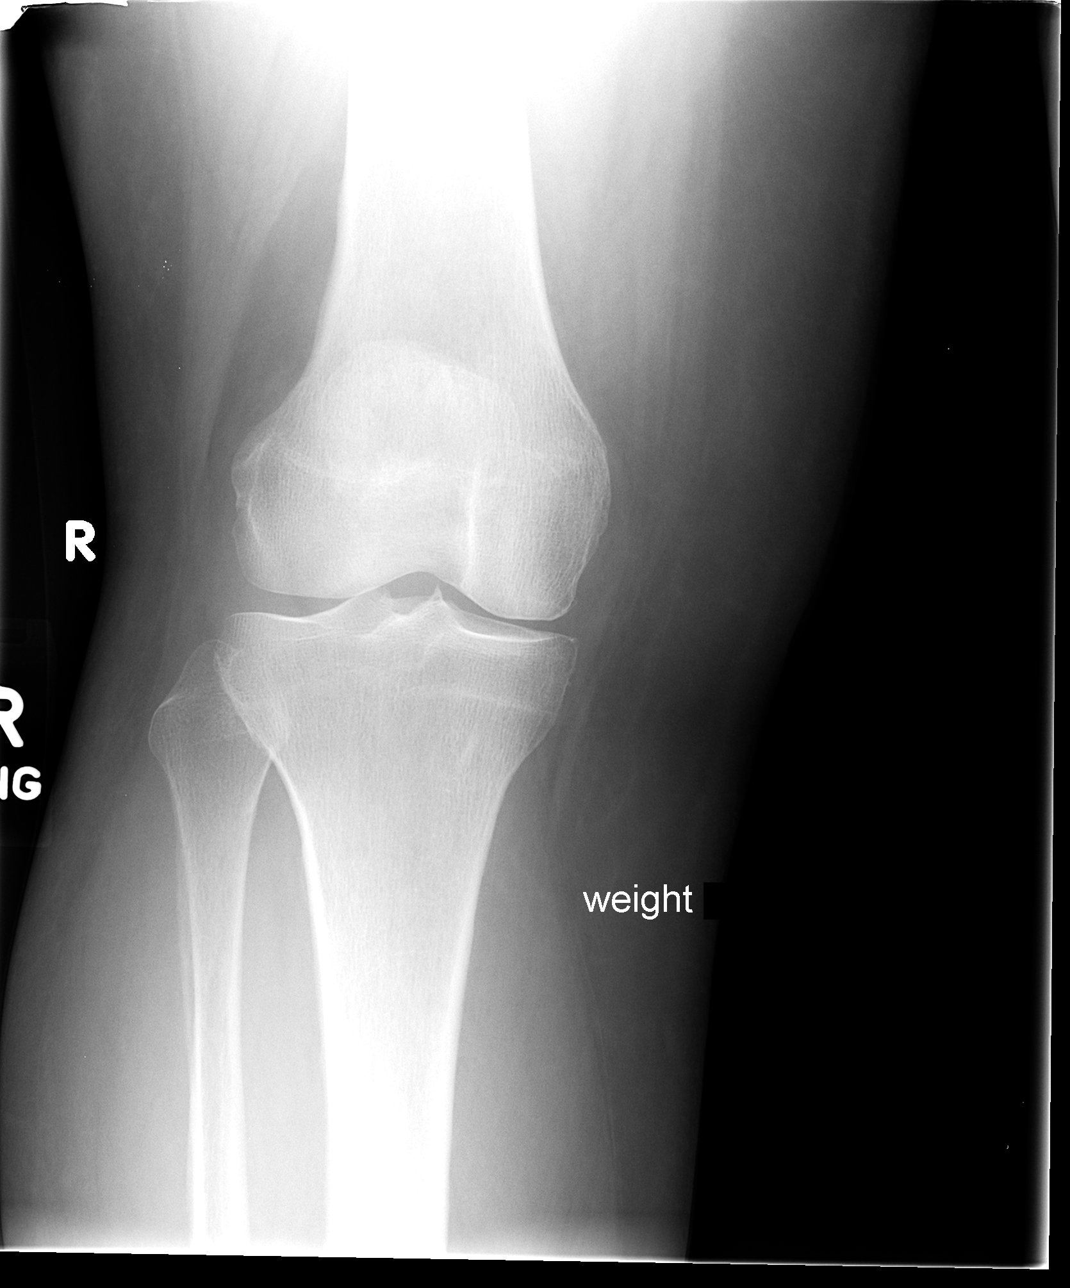

[view not recorded (2 of 4)]
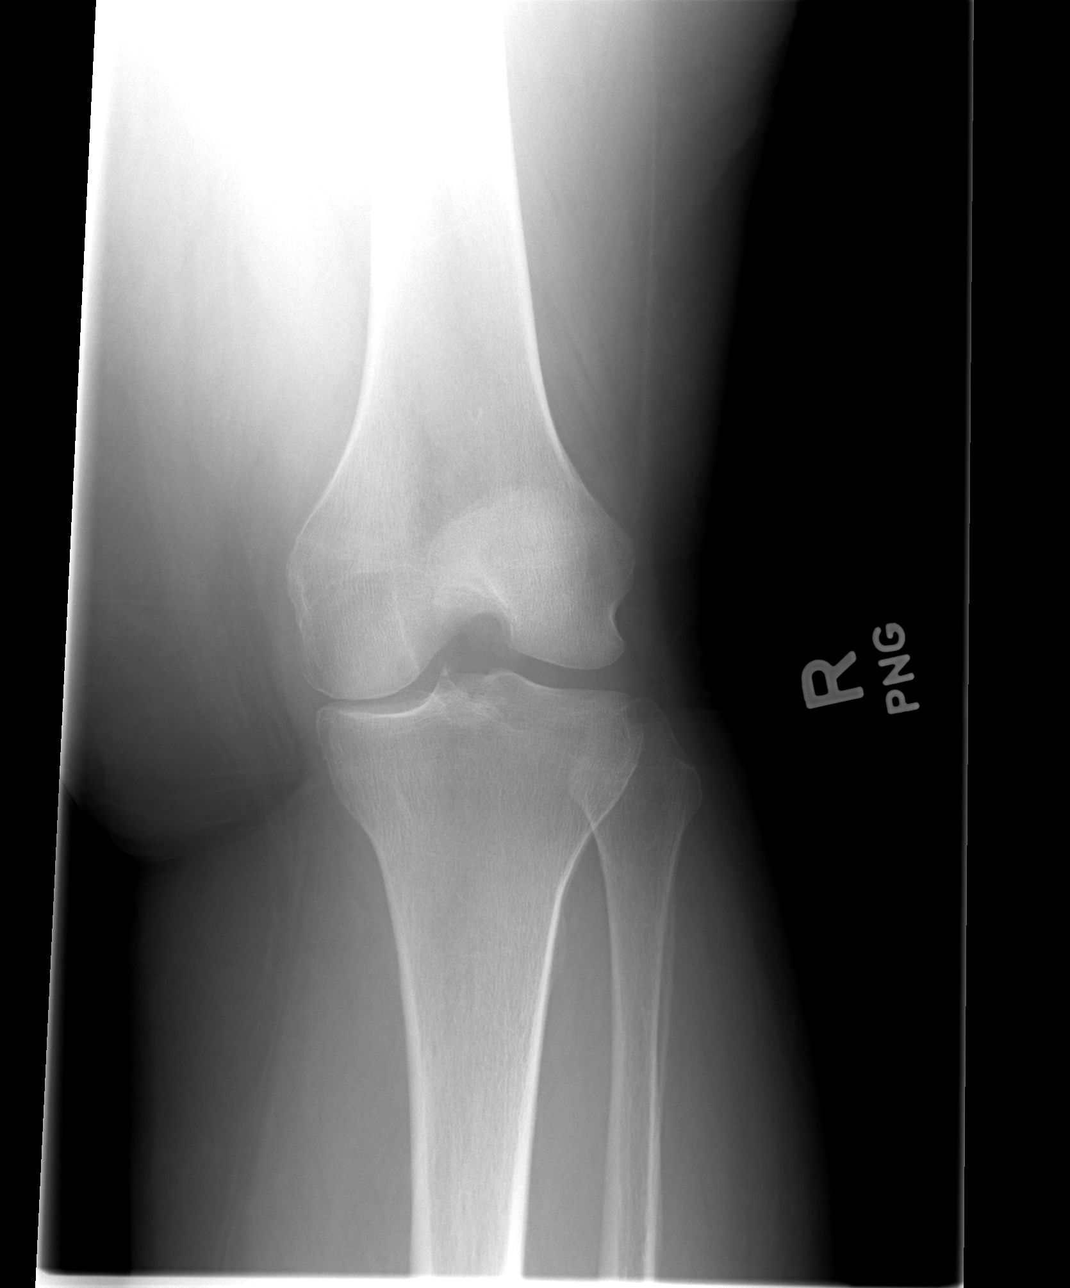

[view not recorded (3 of 4)]
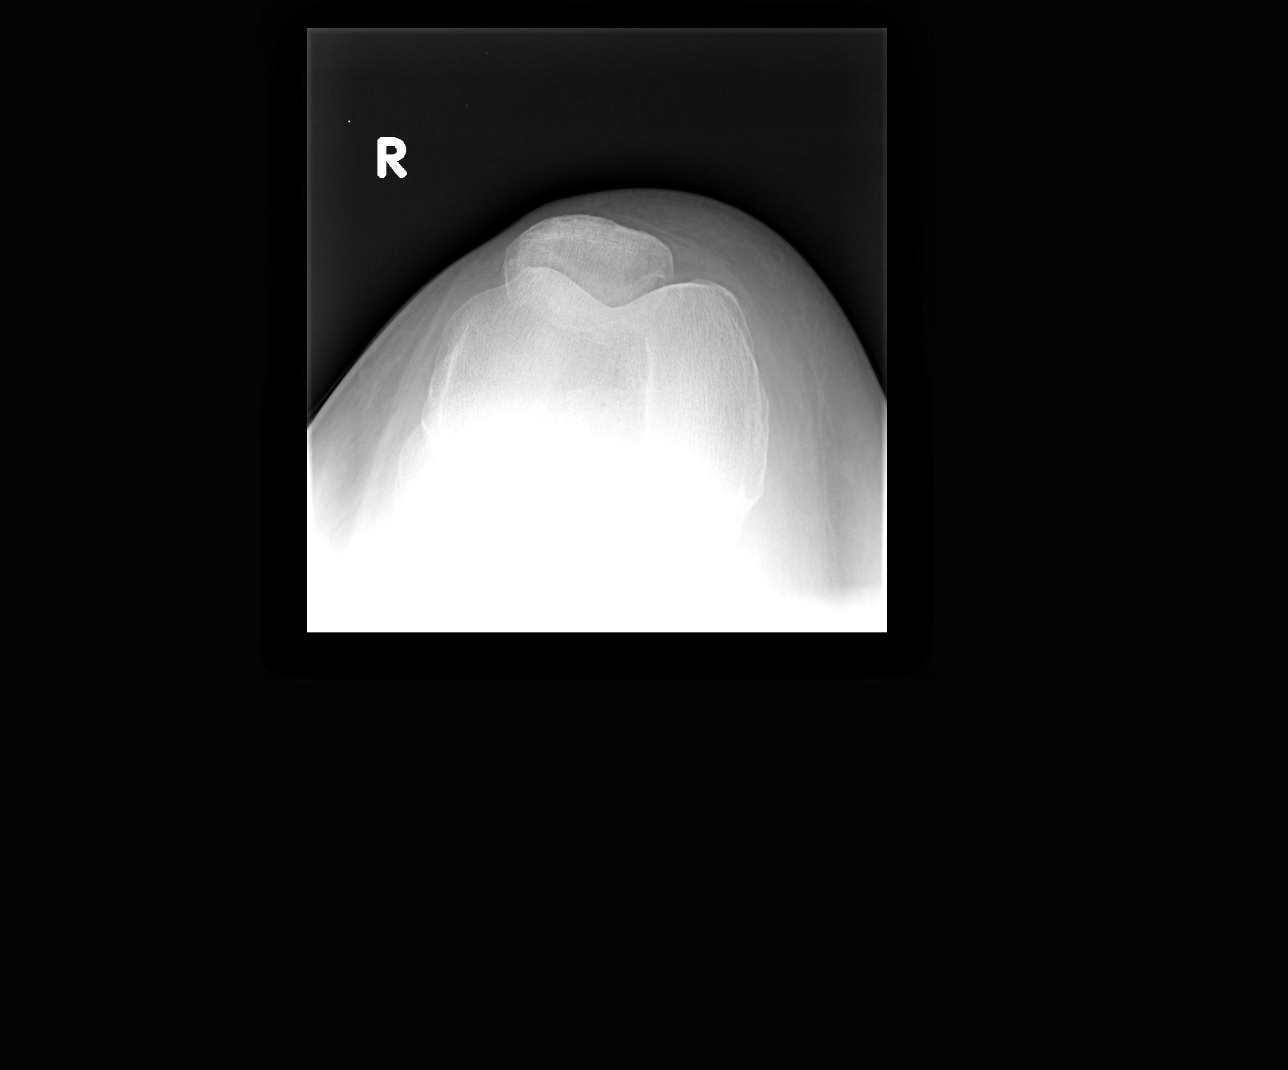

[view not recorded (4 of 4)]
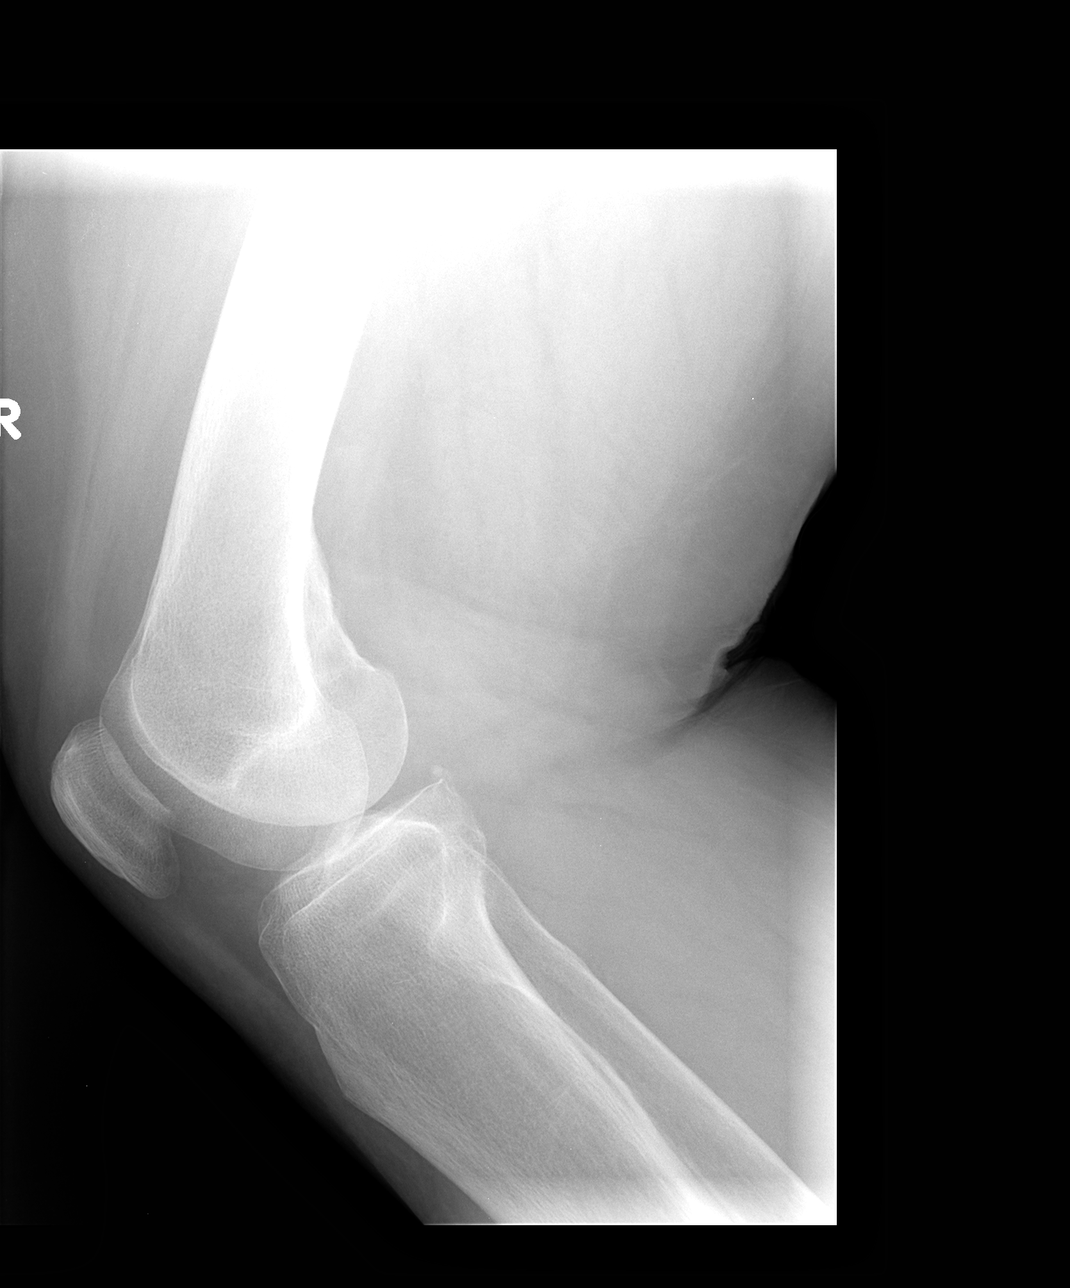

[4 of 4 positions shown; findings below may reference images not displayed]

FINDINGS: No fracture or dislocation.

Mild medial tibiofemoral joint space and minimal patellofemoral
joint degenerative changes.
IMPRESSION: No fracture or dislocation.

Mild medial tibiofemoral joint space and minimal patellofemoral
joint degenerative changes.

## 2014-05-21 MED ORDER — METFORMIN HCL 1000 MG PO TABS: ORAL_TABLET | ORAL | Status: DC

## 2014-05-21 MED ORDER — IRBESARTAN 150 MG PO TABS: 150.0000 mg | ORAL_TABLET | Freq: Every day | ORAL | Status: DC

## 2014-05-21 NOTE — Assessment & Plan Note (Signed)
stable overall by history and exam, recent data reviewed with pt, and pt to continue medical treatment as before,  to f/u any worsening symptoms or concerns Lab Results  Component Value Date   LDLCALC 65 11/18/2013

## 2014-05-21 NOTE — Assessment & Plan Note (Signed)
stable overall by history and exam, recent data reviewed with pt, and pt to continue medical treatment as before,  to f/u any worsening symptoms or concerns BP Readings from Last 3 Encounters:  05/21/14 134/80  11/18/13 130/72  10/22/13 150/68

## 2014-05-21 NOTE — Addendum Note (Signed)
Addended by: Maurene Capes on: 05/21/2014 11:25 AM   Modules accepted: Orders

## 2014-05-21 NOTE — Progress Notes (Signed)
Subjective:    Patient ID: Mary Mccarthy, female    DOB: 1941-01-27, 74 y.o.   MRN: [id number]  HPI  Here to f/u; overall doing ok,  Pt denies chest pain, increasing sob or doe, wheezing, orthopnea, PND, increased LE swelling, palpitations, dizziness or syncope.  Pt denies new neurological symptoms such as new headache, or facial or extremity weakness or numbness.  Pt denies polydipsia, polyuria, or low sugar episode.   Pt denies new neurological symptoms such as new headache, or facial or extremity weakness or numbness.   Pt states overall good compliance with meds, mostly trying to follow appropriate diet, with wt overall stable,  but little exercise however.  Does have times when she is more active and drinks orange jiuce to feel better. Last a1c normal at 6.1 last visit.  No other frank low sugar symptoms.  Also trying to be more active started with right leg shin splints, but now more pain to the right knee x 1 mo , occas swelling, has few giveaways but no falls.  No prior hx of knee problem, recent xrays or MRI. Stands quite a bit during the day, but pain can get to 5/10, better to sit (rarely 10/10).  Can even feel some discomfort at rest, not clear that rest makes completely better. No hx of PVD.  Smoked only for 1 yr ago at 74yo.  Past Medical History  Diagnosis Date  . Left knee DJD   . ALLERGIC RHINITIS 06/24/2007  . DEPRESSION 10/08/2006  . DIABETES MELLITUS, TYPE II 10/05/2006  . GERD 06/24/2007  . HYPERLIPIDEMIA 10/08/2006  . HYPERTENSION 10/08/2006  . PEPTIC ULCER DISEASE 10/08/2006  . COLONIC POLYPS, HX OF 06/24/2007  . INSOMNIA 09/24/2008  . Anemia   . Heart murmur    Past Surgical History  Procedure Laterality Date  . Breast biopsy    . Appendectomy    . Tubal ligation    . Tonsillectomy      reports that she has quit smoking. She does not have any smokeless tobacco history on file. She reports that she does not drink alcohol or use illicit drugs. family history includes Alcohol  abuse in her other; Arthritis in her other; Cancer in her mother; Depression in her other; Diabetes in her other; Heart disease in her other; Hyperlipidemia in her other; Hypertension in her other; Stroke in her other. Allergies  Allergen Reactions  . Ibuprofen    Current Outpatient Prescriptions on File Prior to Visit  Medication Sig Dispense Refill  . aspirin 81 MG EC tablet Take 81 mg by mouth daily.      . clonazePAM (KLONOPIN) 0.5 MG tablet TAKE ONE TO TWO TABLETS BY MOUTH AT NIGHT AS NEEDED FOR SLEEP 60 tablet 2  . glimepiride (AMARYL) 2 MG tablet Take 1 tablet (2 mg total) by mouth daily before breakfast. 30 tablet 11  . hydrochlorothiazide (HYDRODIURIL) 25 MG tablet Take 1 tablet (25 mg total) by mouth daily. 30 tablet 11  . irbesartan (AVAPRO) 150 MG tablet Take 1 tablet (150 mg total) by mouth daily. 90 tablet 3  . metFORMIN (GLUCOPHAGE) 1000 MG tablet 1 tab by mouth in the AM,and 1/2 tab in the PM 180 tablet 1  . omeprazole (PRILOSEC) 20 MG capsule Take 1 capsule (20 mg total) by mouth 2 (two) times daily. 60 capsule 11  . pravastatin (PRAVACHOL) 40 MG tablet Take 1 tablet (40 mg total) by mouth daily. 30 tablet 11   No current facility-administered medications on  file prior to visit.   Review of Systems  Constitutional: Negative for unusual diaphoresis or night sweats HENT: Negative for ringing in ear or discharge Eyes: Negative for double vision or worsening visual disturbance.  Respiratory: Negative for choking and stridor.   Gastrointestinal: Negative for vomiting or other signifcant bowel change Genitourinary: Negative for hematuria or change in urine volume.  Musculoskeletal: Negative for other MSK pain or swelling Skin: Negative for color change and worsening wound.  Neurological: Negative for tremors and numbness other than noted  Psychiatric/Behavioral: Negative for decreased concentration or agitation other than above       Objective:   Physical Exam BP 134/80  mmHg  Pulse 76  Temp(Src) 97.8 F (36.6 C) (Oral)  Resp 18  Ht  (1.549 m)  Wt 224 lb (101.606 kg)  BMI 42.35 kg/m2  SpO2 97% VS noted, not ill appering Constitutional: Pt appears in no significant distress HENT: Head: NCAT.  Right Ear: External ear normal.  Left Ear: External ear normal.  Eyes: . Pupils are equal, round, and reactive to light. Conjunctivae and EOM are normal Neck: Normal range of motion. Neck supple.  Cardiovascular: Normal rate and regular rhythm.   Pulmonary/Chest: Effort normal and breath sounds without rales or wheezing.  Neurological: Pt is alert. Not confused , motor grossly intact Skin: Skin is warm. No rash, no LE edema Psychiatric: Pt behavior is normal. No agitation.  Right knee with trace effusion, mild warmth throughout, FROM, NT but cannot use the knee to stop up on exam table due to risk of giveaway       Assessment & Plan:

## 2014-05-21 NOTE — Patient Instructions (Addendum)
Please continue all other medications as before, and refills have been done if requested.  We may be able to cut back on the glimeparide depending on your a1c results  Please have the pharmacy call with any other refills you may need.  Please continue your efforts at being more active, low cholesterol diet, and weight control.  You will be contacted regarding the referral for: Dr Katrinka Blazing - sports medicine for the knee  Please keep your appointments with your specialists as you may have planned  Please go to the XRAY Department in the Basement (go straight as you get off the elevator) for the x-ray testing  Please go to the LAB in the Basement (turn left off the elevator) for the tests to be done today  You will be contacted by phone if any changes need to be made immediately.  Otherwise, you will receive a letter about your results with an explanation, but please check with MyChart first.  Please return in 6 months, or sooner if needed, with Lab testing done 3-5 days before

## 2014-05-21 NOTE — Assessment & Plan Note (Signed)
? ?   Mild overcontrolled, but o/w stable overall by history and exam, recent data reviewed with pt, and pt to continue medical treatment as before,  to f/u any worsening symptoms or concerns, consider decrease glimeparide if a1c if normal  Lab Results  Component Value Date   HGBA1C 6.1 11/18/2013

## 2014-05-21 NOTE — Assessment & Plan Note (Signed)
With small effusion, liekly meniscal tear vs DJD flare? For film, declines pain med, also [facility]

## 2014-05-21 NOTE — Progress Notes (Signed)
Pre visit review using our clinic review tool, if applicable. No additional management support is needed unless otherwise documented below in the visit note. 

## 2014-05-27 ENCOUNTER — Telehealth: Payer: Self-pay | Admitting: Internal Medicine

## 2014-05-27 NOTE — Telephone Encounter (Signed)
Please call patient with xray results

## 2014-05-27 NOTE — Telephone Encounter (Signed)
Message was sent to MyChart

## 2014-05-28 ENCOUNTER — Encounter: Payer: Self-pay | Admitting: Internal Medicine

## 2014-06-05 NOTE — Telephone Encounter (Signed)
Would like a call back in regards to x ray results.  Patient states she does not use my chart

## 2014-06-05 NOTE — Telephone Encounter (Signed)
Informed patient of results

## 2014-06-11 ENCOUNTER — Ambulatory Visit: Payer: Medicare Other | Admitting: Family Medicine

## 2014-06-30 ENCOUNTER — Other Ambulatory Visit: Payer: Self-pay

## 2014-06-30 MED ORDER — OMEPRAZOLE 20 MG PO CPDR: 20.0000 mg | DELAYED_RELEASE_CAPSULE | Freq: Two times a day (BID) | ORAL | Status: DC

## 2014-07-09 ENCOUNTER — Other Ambulatory Visit: Payer: Self-pay

## 2014-07-13 MED ORDER — CLONAZEPAM 0.5 MG PO TABS: ORAL_TABLET | ORAL | Status: DC

## 2014-07-13 NOTE — Telephone Encounter (Signed)
Called refill into pharmacy had to leave on pharmacist vm.../lmb 

## 2014-07-14 ENCOUNTER — Other Ambulatory Visit: Payer: Self-pay

## 2014-07-14 MED ORDER — PRAVASTATIN SODIUM 40 MG PO TABS: 40.0000 mg | ORAL_TABLET | Freq: Every day | ORAL | Status: DC

## 2014-08-11 ENCOUNTER — Other Ambulatory Visit: Payer: Self-pay

## 2014-08-11 MED ORDER — HYDROCHLOROTHIAZIDE 25 MG PO TABS: 25.0000 mg | ORAL_TABLET | Freq: Every day | ORAL | Status: DC

## 2014-10-08 ENCOUNTER — Other Ambulatory Visit: Payer: Self-pay

## 2014-10-08 MED ORDER — GLIMEPIRIDE 2 MG PO TABS: 2.0000 mg | ORAL_TABLET | Freq: Every day | ORAL | Status: DC

## 2014-10-26 IMAGING — US US RENAL
1 series · 14 of 25 positions shown · non-contrast
Comparison: None.

CLINICAL DATA: Acute renal failure

EXAM:
RENAL / URINARY TRACT ULTRASOUND COMPLETE

[Series 1: us renal · 0.28mm/px · 14 of 39 slices shown]
[im 1/39]
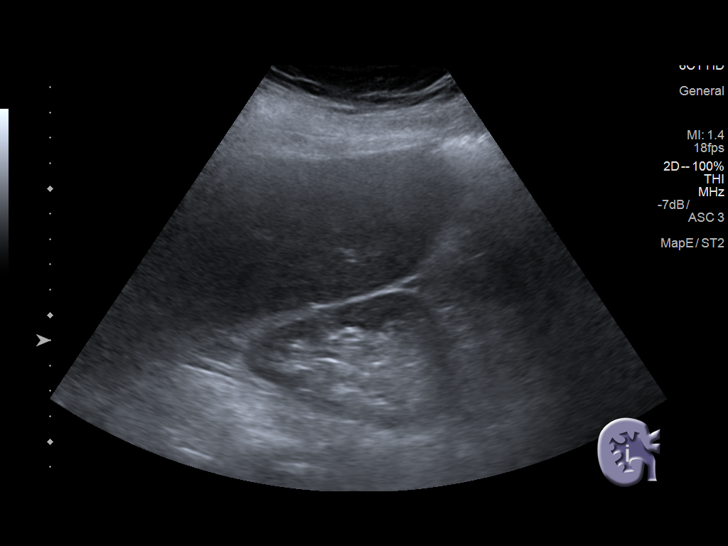
[im 4/39]
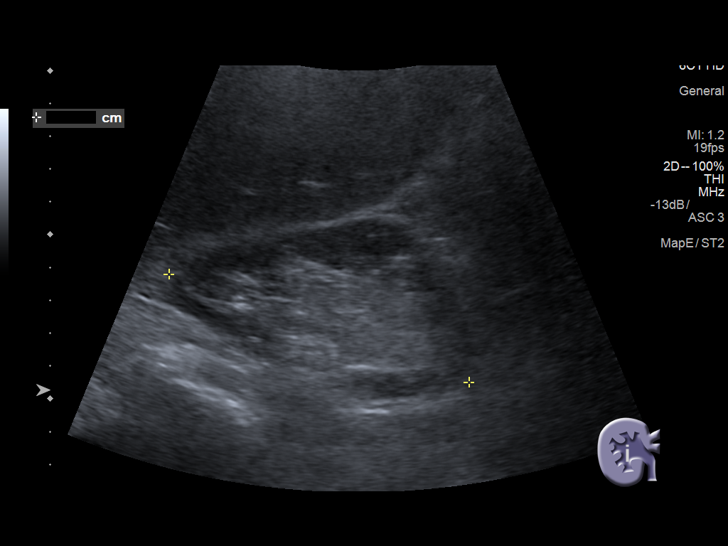
[im 7/39]
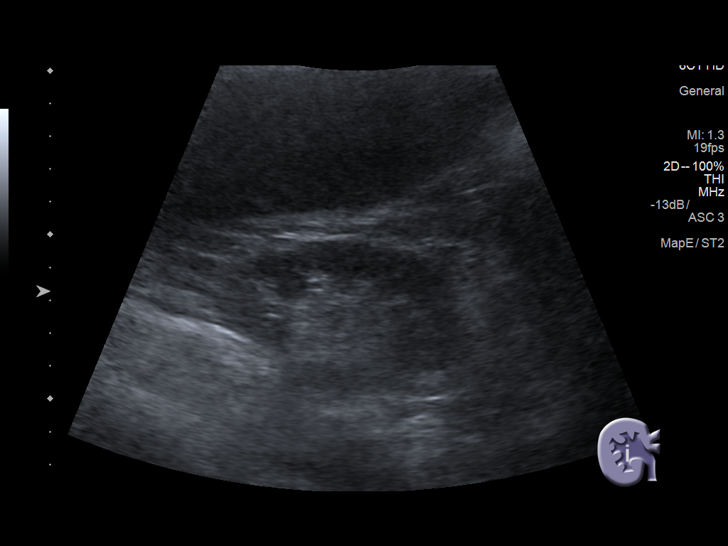
[im 10/39]
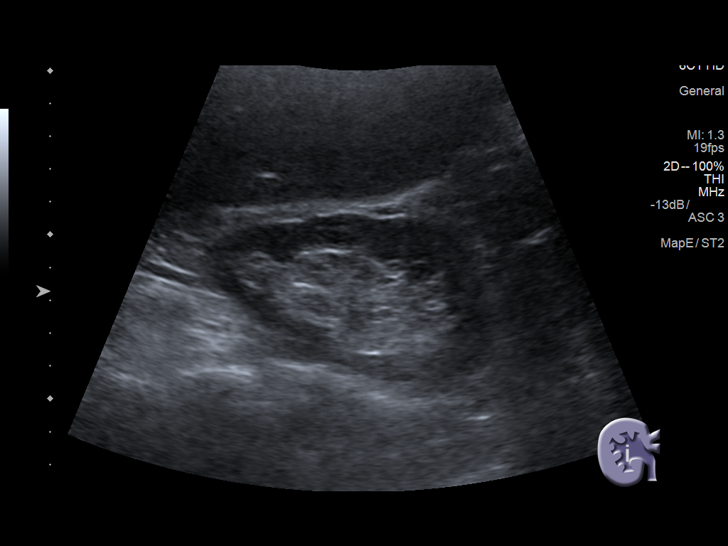
[im 13/39]
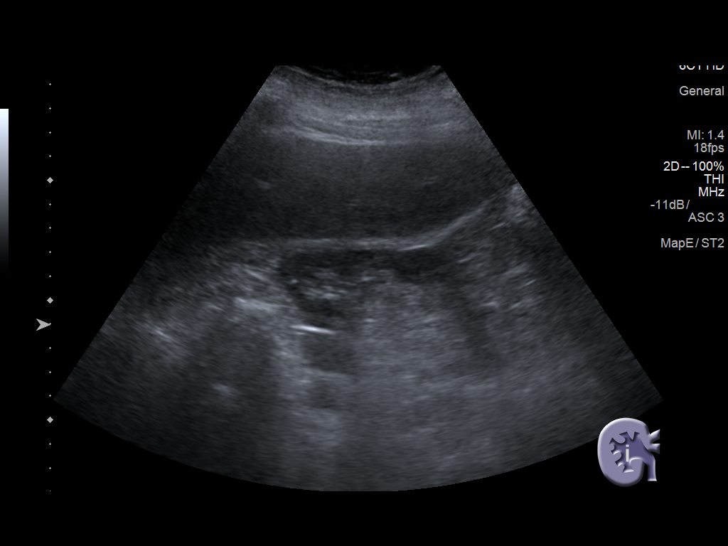
[im 15/39]
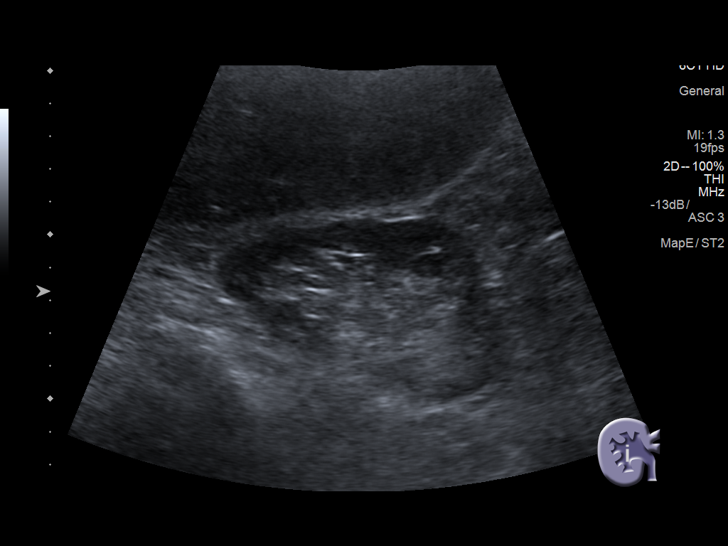
[im 18/39]
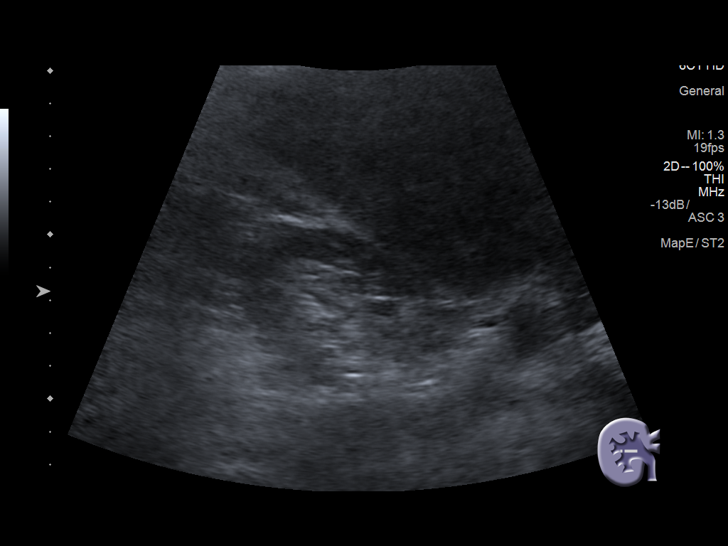
[im 21/39]
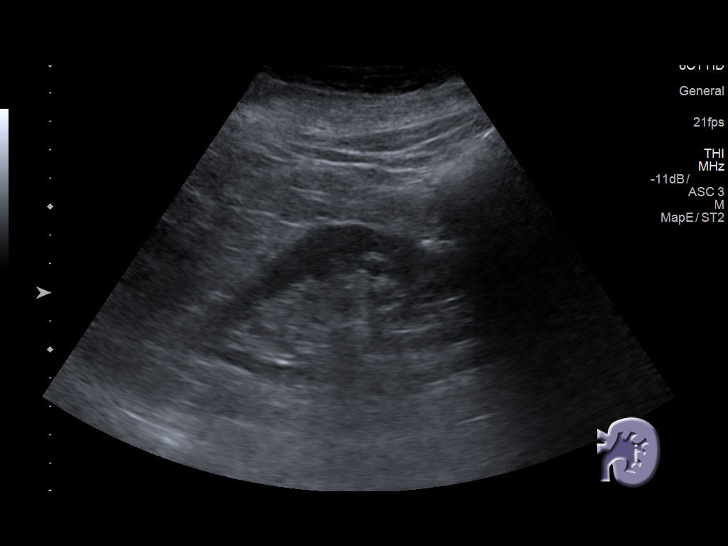
[im 24/39]
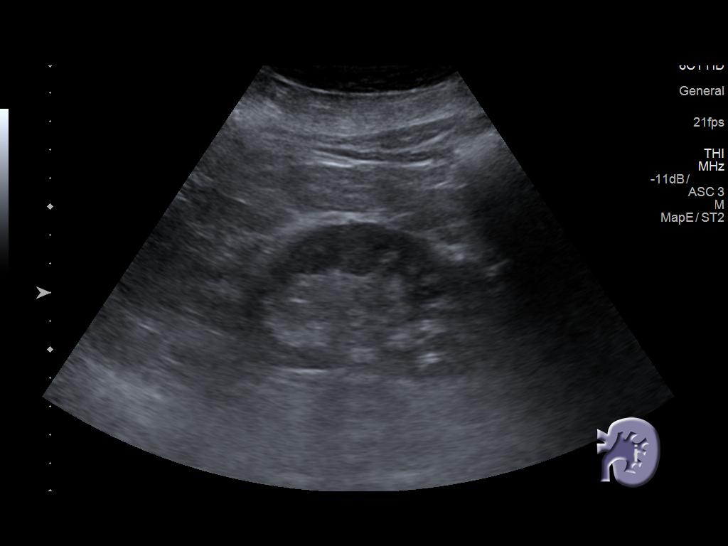
[im 26/39]
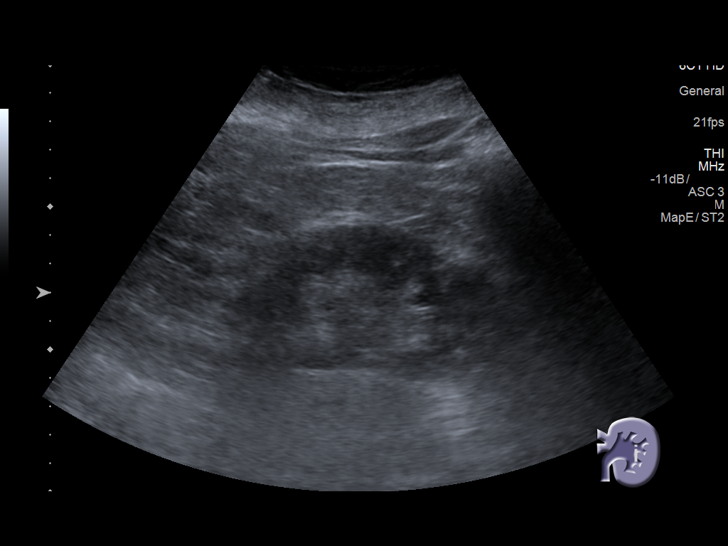
[im 29/39]
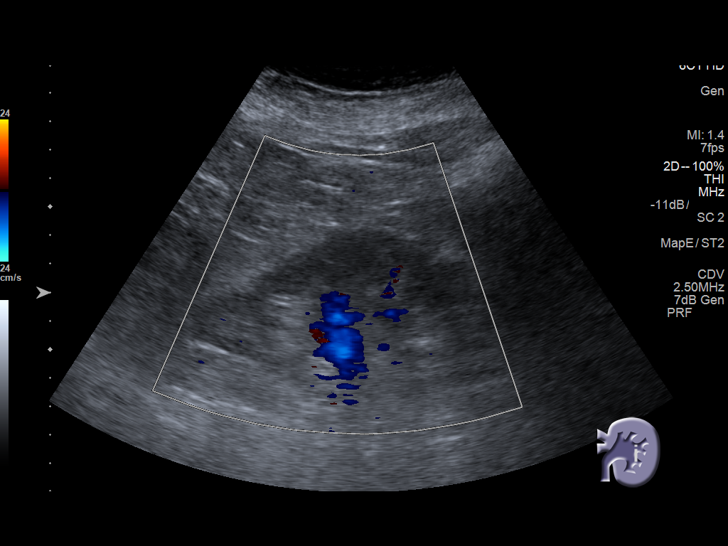
[im 32/39]
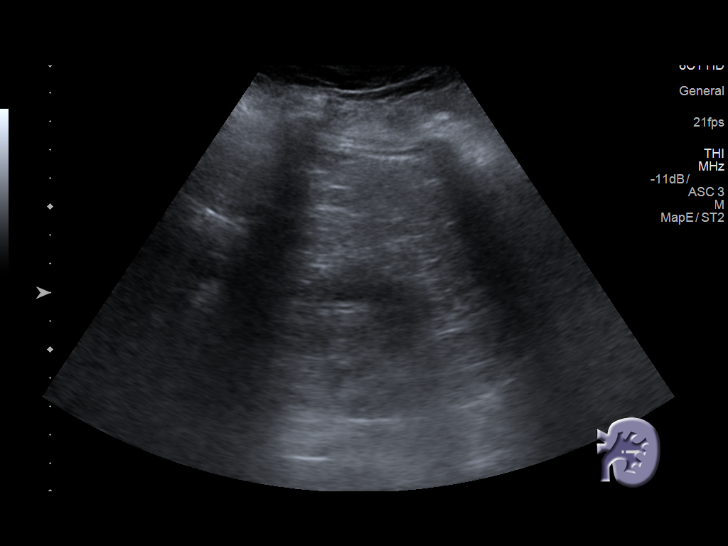
[im 35/39]
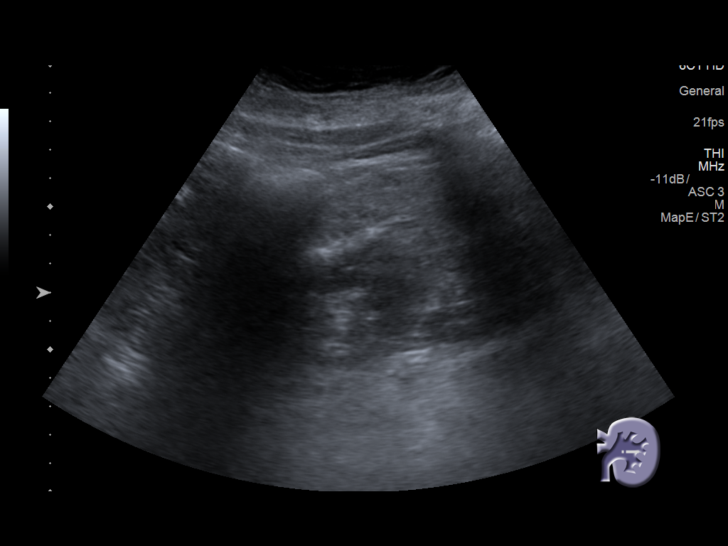
[im 39/39]
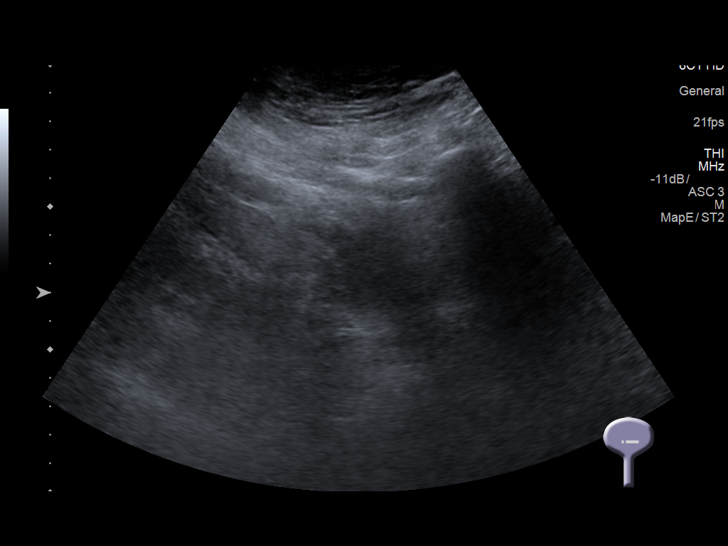

[14 of 25 positions shown; findings below may reference images not displayed]

FINDINGS: Right Kidney:

Length: 10.6 cm. Echogenicity within normal limits. There is renal
cortical thinning. No mass, perinephric fluid, or hydronephrosis
visualized. No sonographically demonstrable calculus or
ureterectasis.

Left Kidney:

Length: 11.5 cm. Echogenicity within normal limits. There is renal
cortical thinning. No mass, perinephric fluid, or hydronephrosis
visualized. No sonographically demonstrable calculus or
ureterectasis.

Bladder:

Appears normal for degree of bladder distention.
IMPRESSION: Renal cortical thinning, a finding that may be associated with
medical renal disease. The renal echogenicity, however, is normal.
Study otherwise unremarkable. In particular, no obstructing foci
identified on either side.

## 2014-11-20 ENCOUNTER — Ambulatory Visit (INDEPENDENT_AMBULATORY_CARE_PROVIDER_SITE_OTHER): Payer: Medicare Other | Admitting: Internal Medicine

## 2014-11-20 ENCOUNTER — Other Ambulatory Visit: Payer: Self-pay | Admitting: Internal Medicine

## 2014-11-20 ENCOUNTER — Encounter: Payer: Self-pay | Admitting: Internal Medicine

## 2014-11-20 ENCOUNTER — Other Ambulatory Visit (INDEPENDENT_AMBULATORY_CARE_PROVIDER_SITE_OTHER): Payer: Medicare Other

## 2014-11-20 VITALS — BP 128/80 | HR 84 | Temp 97.7°F | Ht 61.0 in | Wt 221.0 lb

## 2014-11-20 DIAGNOSIS — E119 Type 2 diabetes mellitus without complications: Secondary | ICD-10-CM

## 2014-11-20 DIAGNOSIS — I10 Essential (primary) hypertension: Secondary | ICD-10-CM

## 2014-11-20 DIAGNOSIS — Z0189 Encounter for other specified special examinations: Secondary | ICD-10-CM

## 2014-11-20 DIAGNOSIS — Z0000 Encounter for general adult medical examination without abnormal findings: Secondary | ICD-10-CM

## 2014-11-20 DIAGNOSIS — B349 Viral infection, unspecified: Secondary | ICD-10-CM | POA: Diagnosis not present

## 2014-11-20 DIAGNOSIS — D649 Anemia, unspecified: Secondary | ICD-10-CM

## 2014-11-20 DIAGNOSIS — E785 Hyperlipidemia, unspecified: Secondary | ICD-10-CM

## 2014-11-20 LAB — LIPID PANEL
CHOL/HDL RATIO: 3
Cholesterol: 104 mg/dL (ref 0–200)
HDL: 35.1 mg/dL — AB (ref >39.00)
LDL CALC: 53 mg/dL (ref 0–99)
NONHDL: 68.62
TRIGLYCERIDES: 80 mg/dL (ref 0.0–149.0)
VLDL: 16 mg/dL (ref 0.0–40.0)

## 2014-11-20 LAB — URINALYSIS, ROUTINE W REFLEX MICROSCOPIC
Bilirubin Urine: NEGATIVE
Hgb urine dipstick: NEGATIVE
KETONES UR: NEGATIVE
Leukocytes, UA: NEGATIVE
NITRITE: NEGATIVE
PH: 7 (ref 5.0–8.0)
RBC / HPF: NONE SEEN (ref 0–?)
SPECIFIC GRAVITY, URINE: 1.01 (ref 1.000–1.030)
TOTAL PROTEIN, URINE-UPE24: NEGATIVE
URINE GLUCOSE: NEGATIVE
Urobilinogen, UA: 0.2 (ref 0.0–1.0)
WBC, UA: NONE SEEN (ref 0–?)

## 2014-11-20 LAB — BASIC METABOLIC PANEL
BUN: 23 mg/dL (ref 6–23)
CALCIUM: 9.9 mg/dL (ref 8.4–10.5)
CO2: 29 mEq/L (ref 19–32)
CREATININE: 0.85 mg/dL (ref 0.40–1.20)
Chloride: 102 mEq/L (ref 96–112)
GFR: 69.09 mL/min (ref >60.00)
Glucose, Bld: 114 mg/dL — ABNORMAL HIGH (ref 70–99)
Potassium: 4.1 mEq/L (ref 3.5–5.1)
Sodium: 137 mEq/L (ref 135–145)

## 2014-11-20 LAB — CBC WITH DIFFERENTIAL/PLATELET
BASOS PCT: 0.3 % (ref 0.0–3.0)
Basophils Absolute: 0 10*3/uL (ref 0.0–0.1)
EOS ABS: 0.1 10*3/uL (ref 0.0–0.7)
EOS PCT: 1.4 % (ref 0.0–5.0)
LYMPHS PCT: 28.9 % (ref 12.0–46.0)
Lymphs Abs: 1.4 10*3/uL (ref 0.7–4.0)
MCHC: 32.1 g/dL (ref 30.0–36.0)
MCV: 81.6 fl (ref 78.0–100.0)
MONOS PCT: 9.2 % (ref 3.0–12.0)
Monocytes Absolute: 0.4 10*3/uL (ref 0.1–1.0)
NEUTROS PCT: 60.2 % (ref 43.0–77.0)
Neutro Abs: 2.9 10*3/uL (ref 1.4–7.7)
Platelets: 226 10*3/uL (ref 150.0–400.0)
RBC: 3.24 Mil/uL — ABNORMAL LOW (ref 3.87–5.11)
RDW: 14.8 % (ref 11.5–15.5)
WBC: 4.8 10*3/uL (ref 4.0–10.5)

## 2014-11-20 LAB — HEPATIC FUNCTION PANEL
ALT: 12 U/L (ref 0–35)
AST: 15 U/L (ref 0–37)
Albumin: 3.8 g/dL (ref 3.5–5.2)
Alkaline Phosphatase: 58 U/L (ref 39–117)
BILIRUBIN DIRECT: 0.2 mg/dL (ref 0.0–0.3)
BILIRUBIN TOTAL: 0.8 mg/dL (ref 0.2–1.2)
Total Protein: 7 g/dL (ref 6.0–8.3)

## 2014-11-20 LAB — TSH: TSH: 2.8 u[IU]/mL (ref 0.35–4.50)

## 2014-11-20 LAB — POCT INFLUENZA A/B
INFLUENZA A, POC: NEGATIVE
INFLUENZA B, POC: NEGATIVE

## 2014-11-20 LAB — HEMOGLOBIN A1C: Hgb A1c MFr Bld: 7.1 % — ABNORMAL HIGH (ref 4.6–6.5)

## 2014-11-20 LAB — MICROALBUMIN / CREATININE URINE RATIO
CREATININE, U: 90 mg/dL
MICROALB/CREAT RATIO: 0.8 mg/g (ref 0.0–30.0)

## 2014-11-20 MED ORDER — CLONAZEPAM 0.5 MG PO TABS: ORAL_TABLET | ORAL | Status: DC

## 2014-11-20 MED ORDER — ONDANSETRON HCL 4 MG PO TABS: 4.0000 mg | ORAL_TABLET | Freq: Three times a day (TID) | ORAL | Status: DC | PRN

## 2014-11-20 NOTE — Progress Notes (Signed)
Subjective:    Patient ID: Mary Mccarthy, female    DOB: November 01, 1940, 74 y.o.   MRN: [id number]  HPI  Here for wellness and f/u;  Overall doing ok;  Pt denies Chest pain, worsening SOB, DOE, wheezing, orthopnea, PND, worsening LE edema, palpitations, dizziness or syncope.  Pt denies neurological change such as new headache, facial or extremity weakness.  Pt denies polydipsia, polyuria, or low sugar symptoms. Pt states overall good compliance with treatment and medications, good tolerability, and has been trying to follow appropriate diet.  Pt denies worsening depressive symptoms, suicidal ideation or panic. No fever, night sweats, wt loss, loss of appetite, or other constitutional symptoms.  Pt states good ability with ADL's, has low fall risk, home safety reviewed and adequate, no other significant changes in hearing or vision, and only occasionally active with exercise. Denies worsening reflux, abd pain, dysphagia, n/v, bowel change or blood. Except for 1 days acute onset n/v/d now resolved.   Pt denies polydipsia, polyuria, Past Medical History  Diagnosis Date  . Left knee DJD   . ALLERGIC RHINITIS 06/24/2007  . DEPRESSION 10/08/2006  . DIABETES MELLITUS, TYPE II 10/05/2006  . GERD 06/24/2007  . HYPERLIPIDEMIA 10/08/2006  . HYPERTENSION 10/08/2006  . PEPTIC ULCER DISEASE 10/08/2006  . COLONIC POLYPS, HX OF 06/24/2007  . INSOMNIA 09/24/2008  . Anemia   . Heart murmur    Past Surgical History  Procedure Laterality Date  . Breast biopsy    . Appendectomy    . Tubal ligation    . Tonsillectomy      reports that she has quit smoking. She does not have any smokeless tobacco history on file. She reports that she does not drink alcohol or use illicit drugs. family history includes Alcohol abuse in her other; Arthritis in her other; Cancer in her mother; Depression in her other; Diabetes in her other; Heart disease in her other; Hyperlipidemia in her other; Hypertension in her other; Stroke in her  other. Allergies  Allergen Reactions  . Ibuprofen    Current Outpatient Prescriptions on File Prior to Visit  Medication Sig Dispense Refill  . aspirin 81 MG EC tablet Take 81 mg by mouth daily.      Marland Kitchen glimepiride (AMARYL) 2 MG tablet Take 1 tablet (2 mg total) by mouth daily before breakfast. 30 tablet 5  . hydrochlorothiazide (HYDRODIURIL) 25 MG tablet Take 1 tablet (25 mg total) by mouth daily. 30 tablet 11  . irbesartan (AVAPRO) 150 MG tablet Take 1 tablet (150 mg total) by mouth daily. 90 tablet 3  . metFORMIN (GLUCOPHAGE) 1000 MG tablet 1 tab by mouth in the AM,and 1/2 tab in the PM 180 tablet 1  . omeprazole (PRILOSEC) 20 MG capsule Take 1 capsule (20 mg total) by mouth 2 (two) times daily. 180 capsule 3  . pravastatin (PRAVACHOL) 40 MG tablet Take 1 tablet (40 mg total) by mouth daily. 30 tablet 6   No current facility-administered medications on file prior to visit.   Review of Systems Constitutional: Negative for increased diaphoresis, other activity, appetite or siginficant weight change other than noted HENT: Negative for worsening hearing loss, ear pain, facial swelling, mouth sores and neck stiffness.   Eyes: Negative for other worsening pain, redness or visual disturbance.  Respiratory: Negative for shortness of breath and wheezing  Cardiovascular: Negative for chest pain and palpitations.  Gastrointestinal: Negative for diarrhea, blood in stool, abdominal distention or other pain Genitourinary: Negative for hematuria, flank pain or change in  urine volume.  Musculoskeletal: Negative for myalgias or other joint complaints.  Skin: Negative for color change and wound or drainage.  Neurological: Negative for syncope and numbness. other than noted Hematological: Negative for adenopathy. or other swelling Psychiatric/Behavioral: Negative for hallucinations, SI, self-injury, decreased concentration or other worsening agitation.      Objective:   Physical Exam BP 128/80 mmHg   Pulse 84  Temp(Src) 97.7 F (36.5 C) (Oral)  Ht 5\' 1"  (1.549 m)  Wt 221 lb (100.245 kg)  BMI 41.78 kg/m2  SpO2 99% VS noted, mild ill appearing Constitutional: Pt is oriented to person, place, and time. Appears well-developed and well-nourished, in no significant distress Head: Normocephalic and atraumatic.  Right Ear: External ear normal.  Left Ear: External ear normal.  Nose: Nose normal.  Mouth/Throat: Oropharynx is clear and moist.  Eyes: Conjunctivae and EOM are normal. Pupils are equal, round, and reactive to light.  Neck: Normal range of motion. Neck supple. No JVD present. No tracheal deviation present or significant neck LA or mass Cardiovascular: Normal rate, regular rhythm, normal heart sounds and intact distal pulses.   Pulmonary/Chest: Effort normal and breath sounds without rales or wheezing  Abdominal: Soft. Bowel sounds are normal.  No HSM, mild diffuse tender , no guarding or rebound Musculoskeletal: Normal range of motion. Exhibits no edema.  Lymphadenopathy:  Has no cervical adenopathy.  Neurological: Pt is alert and oriented to person, place, and time. Pt has normal reflexes. No cranial nerve deficit. Motor grossly intact Skin: Skin is warm and dry. No rash noted.  Psychiatric:  Has normal mood and affect. Behavior is normal.   RAPID flu - negative    Assessment & Plan:

## 2014-11-20 NOTE — Patient Instructions (Addendum)
You flu test today was negative, but you appear to have a viral illness  Please take all new medication as prescribed - the nausea medication  Please also HOLD on taking the fluid pill (HCT) for 1 wk or until symptoms are gone  Please go to ER for worsening high fever, cough, shortness of breath, abd pain, dizziness or falls  Please continue all other medications as before, and refills have been done if requested.  Please have the pharmacy call with any other refills you may need.  Please continue your efforts at being more active, low cholesterol diet, and weight control.  You are otherwise up to date with prevention measures today.  Please keep your appointments with your specialists as you may have planned  Please go to the LAB in the Basement (turn left off the elevator) for the tests to be done today  You will be contacted by phone if any changes need to be made immediately.  Otherwise, you will receive a letter about your results with an explanation, but please check with MyChart first.  Please remember to sign up for MyChart if you have not done so, as this will be important to you in the future with finding out test results, communicating by private email, and scheduling acute appointments online when needed.  Please return in 6 months, or sooner if needed, with Lab testing done 3-5 days before

## 2014-11-20 NOTE — Progress Notes (Signed)
Pre visit review using our clinic review tool, if applicable. No additional management support is needed unless otherwise documented below in the visit note. 

## 2014-11-21 NOTE — Assessment & Plan Note (Signed)

## 2014-11-21 NOTE — Assessment & Plan Note (Signed)
stable overall by history and exam, recent data reviewed with pt, and pt to continue medical treatment as before,  to f/u any worsening symptoms or concerns BP Readings from Last 3 Encounters:  11/20/14 128/80  05/21/14 134/80  11/18/13 130/72

## 2014-11-21 NOTE — Assessment & Plan Note (Signed)
stable overall by history and exam, recent data reviewed with pt, and pt to continue medical treatment as before,  to f/u any worsening symptoms or concerns Lab Results  Component Value Date   HGBA1C 7.1* 11/20/2014

## 2014-11-21 NOTE — Assessment & Plan Note (Addendum)
Resolved,  to f/u any worsening symptoms or concerns, for hold hct for 1 wk, for antiemetic prn

## 2014-11-23 ENCOUNTER — Encounter: Payer: Self-pay | Admitting: Internal Medicine

## 2015-02-18 ENCOUNTER — Ambulatory Visit (INDEPENDENT_AMBULATORY_CARE_PROVIDER_SITE_OTHER): Payer: Medicare Other

## 2015-02-18 DIAGNOSIS — Z23 Encounter for immunization: Secondary | ICD-10-CM | POA: Diagnosis not present

## 2015-04-01 ENCOUNTER — Other Ambulatory Visit: Payer: Self-pay

## 2015-04-01 MED ORDER — METFORMIN HCL 1000 MG PO TABS: ORAL_TABLET | ORAL | Status: DC

## 2015-04-08 ENCOUNTER — Other Ambulatory Visit: Payer: Self-pay

## 2015-04-08 MED ORDER — GLIMEPIRIDE 2 MG PO TABS: 2.0000 mg | ORAL_TABLET | Freq: Every day | ORAL | Status: DC

## 2015-04-14 ENCOUNTER — Telehealth: Payer: Self-pay

## 2015-04-14 MED ORDER — PRAVASTATIN SODIUM 40 MG PO TABS: 40.0000 mg | ORAL_TABLET | Freq: Every day | ORAL | Status: DC

## 2015-04-14 NOTE — Telephone Encounter (Signed)
Pt lm on triage requesting rf for pravastatin LOV 11/2014. 6 mo has been scheduled with PCP   erx done.   Pt contacted and informed of same.

## 2015-05-08 DIAGNOSIS — I2510 Atherosclerotic heart disease of native coronary artery without angina pectoris: Secondary | ICD-10-CM

## 2015-05-08 DIAGNOSIS — Z5189 Encounter for other specified aftercare: Secondary | ICD-10-CM

## 2015-05-08 DIAGNOSIS — I219 Acute myocardial infarction, unspecified: Secondary | ICD-10-CM

## 2015-05-08 DIAGNOSIS — I350 Nonrheumatic aortic (valve) stenosis: Secondary | ICD-10-CM

## 2015-05-08 HISTORY — DX: Atherosclerotic heart disease of native coronary artery without angina pectoris: I25.10

## 2015-05-08 HISTORY — DX: Encounter for other specified aftercare: Z51.89

## 2015-05-08 HISTORY — DX: Acute myocardial infarction, unspecified: I21.9

## 2015-05-08 HISTORY — DX: Nonrheumatic aortic (valve) stenosis: I35.0

## 2015-05-17 ENCOUNTER — Inpatient Hospital Stay (HOSPITAL_COMMUNITY)
Admission: EM | Admit: 2015-05-17 | Discharge: 2015-05-23 | DRG: 871 | Disposition: A | Discharge status: Home / Self Care | Payer: Medicare Other | Attending: Internal Medicine | Admitting: Internal Medicine

## 2015-05-17 ENCOUNTER — Encounter (HOSPITAL_COMMUNITY): Payer: Self-pay

## 2015-05-17 ENCOUNTER — Emergency Department (HOSPITAL_COMMUNITY): Payer: Medicare Other

## 2015-05-17 DIAGNOSIS — R402362 Coma scale, best motor response, obeys commands, at arrival to emergency department: Secondary | ICD-10-CM | POA: Diagnosis not present

## 2015-05-17 DIAGNOSIS — E785 Hyperlipidemia, unspecified: Secondary | ICD-10-CM | POA: Diagnosis present

## 2015-05-17 DIAGNOSIS — A419 Sepsis, unspecified organism: Secondary | ICD-10-CM | POA: Diagnosis not present

## 2015-05-17 DIAGNOSIS — R0602 Shortness of breath: Secondary | ICD-10-CM | POA: Diagnosis not present

## 2015-05-17 DIAGNOSIS — K253 Acute gastric ulcer without hemorrhage or perforation: Secondary | ICD-10-CM | POA: Diagnosis not present

## 2015-05-17 DIAGNOSIS — D649 Anemia, unspecified: Secondary | ICD-10-CM | POA: Diagnosis present

## 2015-05-17 DIAGNOSIS — I5032 Chronic diastolic (congestive) heart failure: Secondary | ICD-10-CM | POA: Diagnosis not present

## 2015-05-17 DIAGNOSIS — Z7982 Long term (current) use of aspirin: Secondary | ICD-10-CM | POA: Diagnosis not present

## 2015-05-17 DIAGNOSIS — R402252 Coma scale, best verbal response, oriented, at arrival to emergency department: Secondary | ICD-10-CM | POA: Diagnosis not present

## 2015-05-17 DIAGNOSIS — I272 Other secondary pulmonary hypertension: Secondary | ICD-10-CM | POA: Diagnosis present

## 2015-05-17 DIAGNOSIS — I350 Nonrheumatic aortic (valve) stenosis: Secondary | ICD-10-CM | POA: Diagnosis present

## 2015-05-17 DIAGNOSIS — N390 Urinary tract infection, site not specified: Secondary | ICD-10-CM | POA: Diagnosis present

## 2015-05-17 DIAGNOSIS — K259 Gastric ulcer, unspecified as acute or chronic, without hemorrhage or perforation: Secondary | ICD-10-CM | POA: Diagnosis present

## 2015-05-17 DIAGNOSIS — Z87891 Personal history of nicotine dependence: Secondary | ICD-10-CM

## 2015-05-17 DIAGNOSIS — R402142 Coma scale, eyes open, spontaneous, at arrival to emergency department: Secondary | ICD-10-CM | POA: Diagnosis present

## 2015-05-17 DIAGNOSIS — I110 Hypertensive heart disease with heart failure: Secondary | ICD-10-CM | POA: Diagnosis present

## 2015-05-17 DIAGNOSIS — R55 Syncope and collapse: Secondary | ICD-10-CM | POA: Diagnosis not present

## 2015-05-17 DIAGNOSIS — K219 Gastro-esophageal reflux disease without esophagitis: Secondary | ICD-10-CM | POA: Diagnosis present

## 2015-05-17 DIAGNOSIS — I5031 Acute diastolic (congestive) heart failure: Secondary | ICD-10-CM | POA: Diagnosis not present

## 2015-05-17 DIAGNOSIS — E119 Type 2 diabetes mellitus without complications: Secondary | ICD-10-CM | POA: Diagnosis present

## 2015-05-17 DIAGNOSIS — K5520 Angiodysplasia of colon without hemorrhage: Secondary | ICD-10-CM | POA: Diagnosis not present

## 2015-05-17 DIAGNOSIS — E876 Hypokalemia: Secondary | ICD-10-CM | POA: Diagnosis not present

## 2015-05-17 DIAGNOSIS — D509 Iron deficiency anemia, unspecified: Secondary | ICD-10-CM | POA: Diagnosis not present

## 2015-05-17 DIAGNOSIS — D591 Other autoimmune hemolytic anemias: Secondary | ICD-10-CM | POA: Diagnosis not present

## 2015-05-17 DIAGNOSIS — K2950 Unspecified chronic gastritis without bleeding: Secondary | ICD-10-CM | POA: Diagnosis not present

## 2015-05-17 DIAGNOSIS — A4151 Sepsis due to Escherichia coli [E. coli]: Secondary | ICD-10-CM | POA: Diagnosis present

## 2015-05-17 DIAGNOSIS — R079 Chest pain, unspecified: Secondary | ICD-10-CM

## 2015-05-17 DIAGNOSIS — N179 Acute kidney failure, unspecified: Secondary | ICD-10-CM | POA: Diagnosis not present

## 2015-05-17 DIAGNOSIS — R000 Tachycardia, unspecified: Secondary | ICD-10-CM | POA: Diagnosis not present

## 2015-05-17 DIAGNOSIS — K922 Gastrointestinal hemorrhage, unspecified: Secondary | ICD-10-CM | POA: Diagnosis present

## 2015-05-17 DIAGNOSIS — K648 Other hemorrhoids: Secondary | ICD-10-CM | POA: Diagnosis not present

## 2015-05-17 DIAGNOSIS — I209 Angina pectoris, unspecified: Secondary | ICD-10-CM | POA: Diagnosis not present

## 2015-05-17 DIAGNOSIS — I248 Other forms of acute ischemic heart disease: Secondary | ICD-10-CM | POA: Diagnosis not present

## 2015-05-17 DIAGNOSIS — R7989 Other specified abnormal findings of blood chemistry: Secondary | ICD-10-CM | POA: Diagnosis not present

## 2015-05-17 DIAGNOSIS — I10 Essential (primary) hypertension: Secondary | ICD-10-CM | POA: Diagnosis not present

## 2015-05-17 DIAGNOSIS — I5033 Acute on chronic diastolic (congestive) heart failure: Secondary | ICD-10-CM | POA: Diagnosis not present

## 2015-05-17 DIAGNOSIS — K2960 Other gastritis without bleeding: Secondary | ICD-10-CM | POA: Diagnosis not present

## 2015-05-17 DIAGNOSIS — Z79899 Other long term (current) drug therapy: Secondary | ICD-10-CM | POA: Diagnosis not present

## 2015-05-17 DIAGNOSIS — Q2733 Arteriovenous malformation of digestive system vessel: Secondary | ICD-10-CM | POA: Diagnosis not present

## 2015-05-17 DIAGNOSIS — R404 Transient alteration of awareness: Secondary | ICD-10-CM | POA: Diagnosis not present

## 2015-05-17 DIAGNOSIS — I2510 Atherosclerotic heart disease of native coronary artery without angina pectoris: Secondary | ICD-10-CM | POA: Diagnosis not present

## 2015-05-17 DIAGNOSIS — I214 Non-ST elevation (NSTEMI) myocardial infarction: Secondary | ICD-10-CM | POA: Diagnosis present

## 2015-05-17 DIAGNOSIS — I509 Heart failure, unspecified: Secondary | ICD-10-CM | POA: Diagnosis not present

## 2015-05-17 DIAGNOSIS — K2970 Gastritis, unspecified, without bleeding: Secondary | ICD-10-CM | POA: Diagnosis not present

## 2015-05-17 DIAGNOSIS — R0600 Dyspnea, unspecified: Secondary | ICD-10-CM | POA: Diagnosis not present

## 2015-05-17 DIAGNOSIS — I2489 Other forms of acute ischemic heart disease: Secondary | ICD-10-CM | POA: Diagnosis present

## 2015-05-17 LAB — RETICULOCYTES
RBC.: 2.34 MIL/uL — AB (ref 3.87–5.11)
RETIC COUNT ABSOLUTE: 42.1 10*3/uL (ref 19.0–186.0)
RETIC CT PCT: 1.8 % (ref 0.4–3.1)

## 2015-05-17 LAB — CBC WITH DIFFERENTIAL/PLATELET
BASOS ABS: 0 10*3/uL (ref 0.0–0.1)
BASOS PCT: 0 %
EOS PCT: 0 %
Eosinophils Absolute: 0 10*3/uL (ref 0.0–0.7)
HEMATOCRIT: 18.8 % — AB (ref 36.0–46.0)
HEMOGLOBIN: 5 g/dL — AB (ref 12.0–15.0)
Lymphocytes Relative: 4 %
Lymphs Abs: 0.5 10*3/uL — ABNORMAL LOW (ref 0.7–4.0)
MCH: 18.9 pg — ABNORMAL LOW (ref 26.0–34.0)
MCHC: 26.6 g/dL — ABNORMAL LOW (ref 30.0–36.0)
MCV: 70.9 fL — AB (ref 78.0–100.0)
Monocytes Absolute: 0.6 10*3/uL (ref 0.1–1.0)
Monocytes Relative: 5 %
NEUTROS ABS: 11.4 10*3/uL — AB (ref 1.7–7.7)
Neutrophils Relative %: 91 %
Platelets: 233 10*3/uL (ref 150–400)
RBC: 2.65 MIL/uL — AB (ref 3.87–5.11)
RDW: 17.9 % — AB (ref 11.5–15.5)
WBC: 12.5 10*3/uL — AB (ref 4.0–10.5)

## 2015-05-17 LAB — ABO/RH: ABO/RH(D): A POS

## 2015-05-17 LAB — URINE MICROSCOPIC-ADD ON

## 2015-05-17 LAB — IRON AND TIBC
IRON: 21 ug/dL — AB (ref 28–170)
Saturation Ratios: 5 % — ABNORMAL LOW (ref 10.4–31.8)
TIBC: 434 ug/dL (ref 250–450)
UIBC: 413 ug/dL

## 2015-05-17 LAB — URINALYSIS, ROUTINE W REFLEX MICROSCOPIC
Bilirubin Urine: NEGATIVE
GLUCOSE, UA: 100 mg/dL — AB
KETONES UR: 15 mg/dL — AB
NITRITE: POSITIVE — AB
PH: 5 (ref 5.0–8.0)
Protein, ur: 100 mg/dL — AB
Specific Gravity, Urine: 1.015 (ref 1.005–1.030)

## 2015-05-17 LAB — COMPREHENSIVE METABOLIC PANEL
ALT: 13 U/L — AB (ref 14–54)
AST: 25 U/L (ref 15–41)
Albumin: 3.1 g/dL — ABNORMAL LOW (ref 3.5–5.0)
Alkaline Phosphatase: 60 U/L (ref 38–126)
Anion gap: 12 (ref 5–15)
BUN: 21 mg/dL — AB (ref 6–20)
CHLORIDE: 103 mmol/L (ref 101–111)
CO2: 20 mmol/L — AB (ref 22–32)
CREATININE: 1.23 mg/dL — AB (ref 0.44–1.00)
Calcium: 9.2 mg/dL (ref 8.9–10.3)
GFR calc Af Amer: 48 mL/min — ABNORMAL LOW (ref >60)
GFR, EST NON AFRICAN AMERICAN: 42 mL/min — AB (ref >60)
Glucose, Bld: 173 mg/dL — ABNORMAL HIGH (ref 65–99)
Potassium: 3.6 mmol/L (ref 3.5–5.1)
Sodium: 135 mmol/L (ref 135–145)
Total Bilirubin: 1.2 mg/dL (ref 0.3–1.2)
Total Protein: 6.3 g/dL — ABNORMAL LOW (ref 6.5–8.1)

## 2015-05-17 LAB — FERRITIN: FERRITIN: 4 ng/mL — AB (ref 11–307)

## 2015-05-17 LAB — I-STAT CG4 LACTIC ACID, ED
Lactic Acid, Venous: 2.04 mmol/L (ref 0.5–2.0)
Lactic Acid, Venous: 3.5 mmol/L (ref 0.5–2.0)

## 2015-05-17 LAB — I-STAT TROPONIN, ED: Troponin i, poc: 0.37 ng/mL (ref 0.00–0.08)

## 2015-05-17 LAB — BRAIN NATRIURETIC PEPTIDE: B NATRIURETIC PEPTIDE 5: 168.3 pg/mL — AB (ref 0.0–100.0)

## 2015-05-17 LAB — VITAMIN B12: Vitamin B-12: 241 pg/mL (ref 180–914)

## 2015-05-17 LAB — POC OCCULT BLOOD, ED: FECAL OCCULT BLD: NEGATIVE

## 2015-05-17 LAB — PREPARE RBC (CROSSMATCH)

## 2015-05-17 LAB — FOLATE: Folate: 13.9 ng/mL (ref >5.9)

## 2015-05-17 IMAGING — CR DG CHEST 1V PORT
1 series · 1 of 1 positions shown · non-contrast
Comparison: [DATE]

CLINICAL DATA: Chest pain and dyspnea.  Duration 1 day.

EXAM:
PORTABLE CHEST 1 VIEW

[AP]
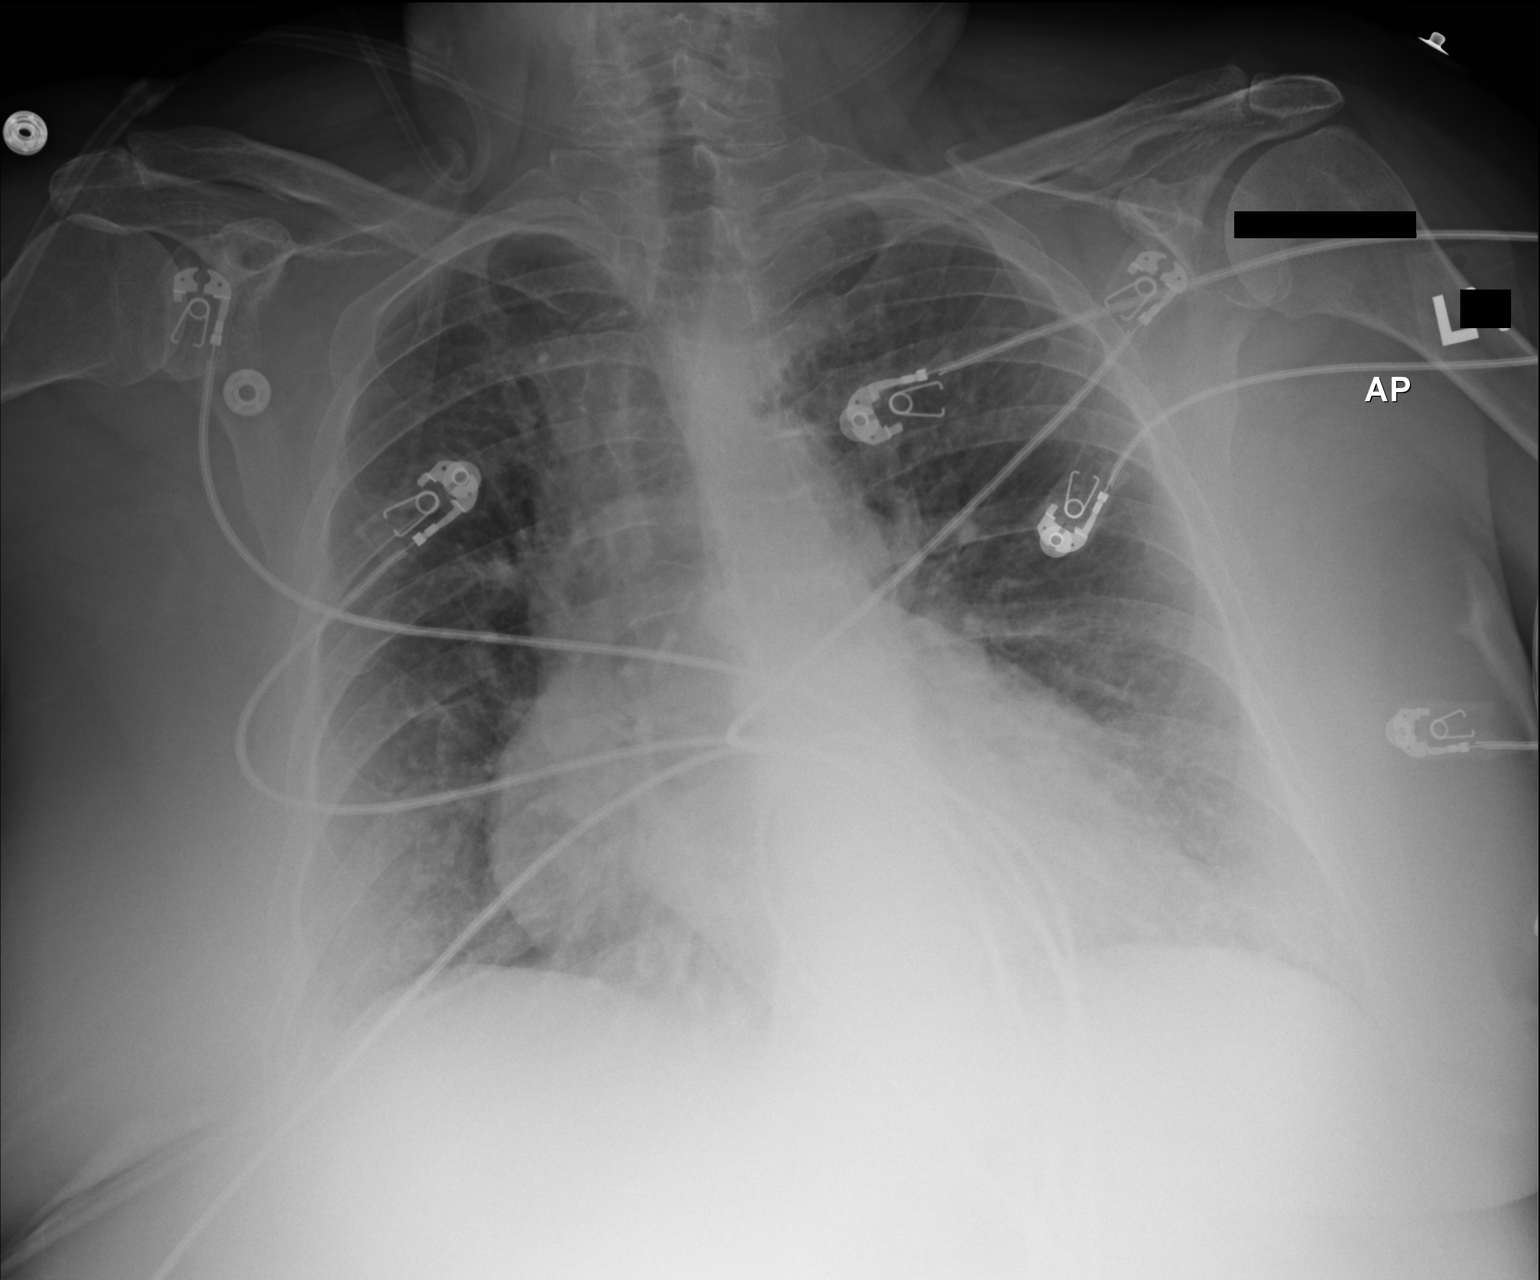

[1 of 1 positions shown; findings below may reference images not displayed]

FINDINGS: A single AP portable view of the chest demonstrates no focal
airspace consolidation or alveolar edema. The lungs are grossly
clear. There is no large effusion or pneumothorax. Cardiac and
mediastinal contours appear unremarkable.
IMPRESSION: No active disease.

## 2015-05-17 MED ORDER — ASPIRIN 81 MG PO CHEW: 324.0000 mg | CHEWABLE_TABLET | Freq: Once | ORAL | Status: DC

## 2015-05-17 MED ORDER — DEXTROSE 5 % IV SOLN
1.0000 g | Freq: Once | INTRAVENOUS | Status: AC
  Administered 2015-05-17: 1 g via INTRAVENOUS
  Filled 2015-05-17: qty 10

## 2015-05-17 MED ORDER — SODIUM CHLORIDE 0.9 % IV SOLN: 10.0000 mL/h | Freq: Once | INTRAVENOUS | Status: DC

## 2015-05-17 MED ORDER — SODIUM CHLORIDE 0.9 % IV BOLUS (SEPSIS)
1000.0000 mL | INTRAVENOUS | Status: AC
  Administered 2015-05-17 (×3): 1000 mL via INTRAVENOUS

## 2015-05-17 MED ORDER — ACETAMINOPHEN 500 MG PO TABS
1000.0000 mg | ORAL_TABLET | Freq: Once | ORAL | Status: AC
  Administered 2015-05-17: 1000 mg via ORAL
  Filled 2015-05-17: qty 2

## 2015-05-17 NOTE — ED Provider Notes (Addendum)
CSN: [id number]     Arrival date & time 05/17/15  2027 History   None    Chief Complaint  Patient presents with  . Chest Pain  . Shortness of Breath     (Consider location/radiation/quality/duration/timing/severity/associated sxs/prior Treatment) Patient is a 75 y.o. female presenting with shortness of breath. The history is provided by the patient.  Shortness of Breath Severity:  Moderate Onset quality:  Gradual Duration:  1 day Timing:  Constant Progression:  Worsening Chronicity:  Chronic (with acute worsening today, chest pain, and fever) Relieved by:  Nothing Worsened by:  Nothing tried Ineffective treatments:  None tried Associated symptoms: chest pain and fever   Associated symptoms: no abdominal pain and no vomiting   Associated symptoms comment:  Severe weakness progressing over months Risk factors comment:  H/o anemia   Past Medical History  Diagnosis Date  . Left knee DJD   . ALLERGIC RHINITIS 06/24/2007  . DEPRESSION 10/08/2006  . DIABETES MELLITUS, TYPE II 10/05/2006  . GERD 06/24/2007  . HYPERLIPIDEMIA 10/08/2006  . HYPERTENSION 10/08/2006  . PEPTIC ULCER DISEASE 10/08/2006  . COLONIC POLYPS, HX OF 06/24/2007  . INSOMNIA 09/24/2008  . Anemia   . Heart murmur    Past Surgical History  Procedure Laterality Date  . Breast biopsy    . Appendectomy    . Tubal ligation    . Tonsillectomy     Family History  Problem Relation Age of Onset  . Cancer Mother     ENT cancer  . Alcohol abuse Other   . Arthritis Other     DJD  . Hyperlipidemia Other   . Heart disease Other   . Stroke Other   . Hypertension Other   . Depression Other   . Diabetes Other    Social History  Substance Use Topics  . Smoking status: Former Research scientist (life sciences)  . Smokeless tobacco: None  . Alcohol Use: No   OB History    No data available     Review of Systems  Constitutional: Positive for fever and fatigue.  Respiratory: Positive for shortness of breath.   Cardiovascular: Positive for  chest pain.  Gastrointestinal: Negative for vomiting, abdominal pain, diarrhea and anal bleeding.  Genitourinary: Negative for hematuria and vaginal bleeding.  Skin: Positive for pallor.  All other systems reviewed and are negative.     Allergies  Ibuprofen  Home Medications   Prior to Admission medications   Medication Sig Start Date End Date Taking? Authorizing Provider  aspirin 81 MG EC tablet Take 81 mg by mouth daily.      Historical Provider, MD  clonazePAM (KLONOPIN) 0.5 MG tablet TAKE ONE TO TWO TABLETS BY MOUTH AT NIGHT AS NEEDED FOR SLEEP 11/20/14   Biagio Borg, MD  glimepiride (AMARYL) 2 MG tablet Take 1 tablet (2 mg total) by mouth daily before breakfast. 04/08/15   Biagio Borg, MD  hydrochlorothiazide (HYDRODIURIL) 25 MG tablet Take 1 tablet (25 mg total) by mouth daily. 08/11/14   Biagio Borg, MD  irbesartan (AVAPRO) 150 MG tablet Take 1 tablet (150 mg total) by mouth daily. 05/21/14   Biagio Borg, MD  metFORMIN (GLUCOPHAGE) 1000 MG tablet 1 tab by mouth in the AM,and 1/2 tab in the PM 04/01/15   Biagio Borg, MD  omeprazole (PRILOSEC) 20 MG capsule Take 1 capsule (20 mg total) by mouth 2 (two) times daily. 06/30/14   Biagio Borg, MD  ondansetron (ZOFRAN) 4 MG tablet Take 1 tablet (  4 mg total) by mouth every 8 (eight) hours as needed for nausea or vomiting. 11/20/14   Biagio Borg, MD  pravastatin (PRAVACHOL) 40 MG tablet Take 1 tablet (40 mg total) by mouth daily. 04/14/15   Biagio Borg, MD   BP 130/53 mmHg  Pulse 125  Temp(Src) 100.3 F (37.9 C) (Oral)  SpO2 97% Physical Exam  Constitutional: She is oriented to person, place, and time. She appears well-developed and well-nourished. No distress.  HENT:  Head: Normocephalic.  Eyes: Conjunctivae are normal.  Neck: Neck supple. No tracheal deviation present.  Cardiovascular: Regular rhythm and normal heart sounds.  Tachycardia present.   Pulmonary/Chest: Effort normal and breath sounds normal. Tachypnea noted. No  respiratory distress.  Abdominal: Soft. She exhibits no distension. There is no tenderness.  Neurological: She is alert and oriented to person, place, and time. She has normal strength. Coordination normal. GCS eye subscore is 4. GCS verbal subscore is 5. GCS motor subscore is 6.  Skin: Skin is warm and dry. She is not diaphoretic. There is pallor.  Psychiatric: She has a normal mood and affect.  Vitals reviewed.   ED Course  Procedures (including critical care time)  CRITICAL CARE Performed by: Leo Grosser Total critical care time: 30 minutes Critical care time was exclusive of separately billable procedures and treating other patients. Critical care was necessary to treat or prevent imminent or life-threatening deterioration. Critical care was time spent personally by me on the following activities: development of treatment plan with patient and/or surrogate as well as nursing, discussions with consultants, evaluation of patient's response to treatment, examination of patient, obtaining history from patient or surrogate, ordering and performing treatments and interventions, ordering and review of laboratory studies, ordering and review of radiographic studies, pulse oximetry and re-evaluation of patient's condition.   Labs Review Labs Reviewed  COMPREHENSIVE METABOLIC PANEL - Abnormal; Notable for the following:    CO2 20 (*)    Glucose, Bld 173 (*)    BUN 21 (*)    Creatinine, Ser 1.23 (*)    Total Protein 6.3 (*)    Albumin 3.1 (*)    ALT 13 (*)    GFR calc non Af Amer 42 (*)    GFR calc Af Amer 48 (*)    All other components within normal limits  CBC WITH DIFFERENTIAL/PLATELET - Abnormal; Notable for the following:    WBC 12.5 (*)    RBC 2.65 (*)    Hemoglobin 5.0 (*)    HCT 18.8 (*)    MCV 70.9 (*)    MCH 18.9 (*)    MCHC 26.6 (*)    RDW 17.9 (*)    Neutro Abs 11.4 (*)    Lymphs Abs 0.5 (*)    All other components within normal limits  URINALYSIS, ROUTINE W REFLEX  MICROSCOPIC (NOT AT [facility]) - Abnormal; Notable for the following:    APPearance TURBID (*)    Glucose, UA 100 (*)    Hgb urine dipstick MODERATE (*)    Ketones, ur 15 (*)    Protein, ur 100 (*)    Nitrite POSITIVE (*)    Leukocytes, UA LARGE (*)    All other components within normal limits  BRAIN NATRIURETIC PEPTIDE - Abnormal; Notable for the following:    B Natriuretic Peptide 168.3 (*)    All other components within normal limits  IRON AND TIBC - Abnormal; Notable for the following:    Iron 21 (*)    Saturation  Ratios 5 (*)    All other components within normal limits  FERRITIN - Abnormal; Notable for the following:    Ferritin 4 (*)    All other components within normal limits  RETICULOCYTES - Abnormal; Notable for the following:    RBC. 2.34 (*)    All other components within normal limits  URINE MICROSCOPIC-ADD ON - Abnormal; Notable for the following:    Squamous Epithelial / LPF 6-30 (*)    Bacteria, UA MANY (*)    All other components within normal limits  GLUCOSE, CAPILLARY - Abnormal; Notable for the following:    Glucose-Capillary 154 (*)    All other components within normal limits  I-STAT CG4 LACTIC ACID, ED - Abnormal; Notable for the following:    Lactic Acid, Venous 3.50 (*)    All other components within normal limits  I-STAT TROPOININ, ED - Abnormal; Notable for the following:    Troponin i, poc 0.37 (*)    All other components within normal limits  I-STAT CG4 LACTIC ACID, ED - Abnormal; Notable for the following:    Lactic Acid, Venous 2.04 (*)    All other components within normal limits  CULTURE, BLOOD (ROUTINE X 2)  CULTURE, BLOOD (ROUTINE X 2)  URINE CULTURE  MRSA PCR SCREENING  INFLUENZA PANEL BY PCR (TYPE A & B, H1N1)  VITAMIN B12  FOLATE  COMPREHENSIVE METABOLIC PANEL  CBC  PROCALCITONIN  POC OCCULT BLOOD, ED  TYPE AND SCREEN  PREPARE RBC (CROSSMATCH)  ABO/RH    Imaging Review Dg Chest Port 1 View  05/17/2015  CLINICAL DATA:  Chest  pain and dyspnea.  Duration 1 day. EXAM: PORTABLE CHEST 1 VIEW COMPARISON:  05/20/2013 FINDINGS: A single AP portable view of the chest demonstrates no focal airspace consolidation or alveolar edema. The lungs are grossly clear. There is no large effusion or pneumothorax. Cardiac and mediastinal contours appear unremarkable. IMPRESSION: No active disease. Electronically Signed   By: Andreas Newport M.D.   On: 05/17/2015 21:17   I have personally reviewed and evaluated these images and lab results as part of my medical decision-making.   EKG Interpretation  Date/Time:  Monday May 17 2015 20:40:48 EDT Ventricular Rate:  133 PR Interval:  132 QRS Duration: 76 QT Interval:  274 QTC Calculation: 407 R Axis:   49 Text Interpretation:  Sinus tachycardia Multiple premature complexes, vent  & supraven Aberrant conduction of SV complex(es) Repol abnrm suggests  ischemia, diffuse leads Baseline wander in lead(s) II ST elevation in aVR  and minimally in  V1, query posterior infarct Confirmed by Coretha Creswell MD,  Rickia Freeburg [phone]) on 05/17/2015 8:52:00 PM      EKG Interpretation  Date/Time:  Monday May 17 2015 20:44:45 EDT Ventricular Rate:  126 PR Interval:  132 QRS Duration: 83 QT Interval:  290 QTC Calculation: 420 R Axis:   42 Text Interpretation:  POSTERIOR STUDY. POSTERIOR STUDY Sinus tachycardia Repol abnrm suggests ischemia, diffuse leads  no contiguous ST elevation No significant change since last tracing Confirmed by Jaquarius Seder MD, Maddelyn Rocca [phone]) on 05/17/2015 8:54:01 PM        MDM   Final diagnoses:  Symptomatic anemia  Demand ischemia of myocardium (HCC)  Sepsis, due to unspecified organism [facility])  Urinary tract infection without hematuria, site unspecified    75 y.o. female presents with shortness of breath, chest pain, feeling of fever and chills today. Had some progressive fatigue for months, possible sick contacts at home. EKG en route with diffuse ST  depressions and up in aVR.  Posterior EKG without STEMI. Discussed with cardiology who agreed with continued medical workup and advised against activating STEMI protocol.   Screening labs c/w severe anemia. No antibiotics given initially because met SIRS criteria but no source identified, delay in urine because Pt unable to spontaneously void but after in and out cath appears she is septic from UTI and likely exacerbated effects d/t severe acute on chronic anemia. No complaints of bleeding. Pt has known iron deficiency anemia for which she is currently not treated. 2u PRBC ordered, anemia panel ordered, fluid resuscitated for sepsis. Hospitalist was consulted for admission and will see the patient in the emergency department.      Leo Grosser, MD 05/18/15 [phone]

## 2015-05-17 NOTE — Progress Notes (Signed)
Pharmacy Code Sepsis Protocol  Time of code sepsis page: 2054 []  Antibiotics delivered at  []  Antibiotics administered prior to code at  (if checked, omit next 2 questions)  Were antibiotics ordered at the time of the code sepsis page? No Was it required to contact the physician? []  Physician not contacted [x]  Physician contacted to order antibiotics for code sepsis - Holding off on antibiotics at this time per MD as further work-up is necessary. Will follow-up for antibiotic needs.  []  Physician contacted to recommend changing antibiotics  Pharmacy consulted for: N/A  Anti-infectives    None        Nurse education provided: N/A []  Minutes left to administer antibiotics to achieve 1 hour goal []  Correct order of antibiotic administration []  Antibiotic Y-site compatibilities     Ferd Horrigan, Drake Leach, PharmD 05/17/2015, 9:04 PM

## 2015-05-17 NOTE — ED Notes (Signed)
Per EMS: Pt had a syncopel episode at home. Fell. Upon waking, complaining of SOB and chest pain. EMS gave 324 ASA and 2 nitro, no change in pain. Pt is tachy @ 125 upon arrival. Pt a/o x4. Pt states "whole family has been sick recently." Pt has non-productive cough.

## 2015-05-17 NOTE — ED Notes (Signed)
Called [facility] to activate code sepsis  

## 2015-05-17 NOTE — H&P (Signed)
[facility] History and Physical  Mary Mccarthy ZHY:[id number] DOB: July 19, 1940 DOA: 05/17/2015  Referring physician: Dr. Clydene Pugh PCP: [name], MD   Chief Complaint: Shortness of breath and pain  HPI:  This Mary Mccarthy is a 75 year old female with a past history significant for PUD, HTN, HLD, DM type 2, GERD, depression; who presents with multiple complaints. Patient had reportedly had a syncopal event at home she fell upon waking complained of shortness of breath and chest pain. She notes for the last 2 months  at least she's had progressively worsening shortness of breath. Reports difficulty walking more than 200 feet without being extremely exhausted. However, symptoms appeared to worsen today where she reports intermittent subjective fever and chills all day. She was unable to move all to count due to extreme fatigue. Had associated symptoms of which she reports is intermittent "flip flopping" of her heart as she attributed to an irregular heartbeat. Within the last few days she has noted increased urinary frequency at night. Order which she does not usually have. Notes multiple recent sick contacts has many members of her family have been sick with pneumonia and flu. She herself has been doing a lot of coughing and complaints of backaches and left shoulder blade pain She notes a known history of iron deficiency anemia, but was unable to take iron supplementation due to reports of diarrhea. Patient denies any black stools, abdominal pain, diarrhea, or constipation.  EMS had been called by the patient's family. In route because of chest pain complaints patient had been given 325 mg of aspirin.  On arrival heart rates were noted to be up to 128, temperature 102.60F, respirations 36, blood pressure 103/50, and O2 saturations 97%. Initial lab work revealed WBC 12.5, hemoglobin 5, CO2 20, BUN 21 creatinine 1.23, glucose 173, and lactic acid 3.5. UA was positive for many bacteria. Code sepsis was called as  the patient was type and screen,  ordered to be transfused 2 units of PRBCs, and started on Rocephin.    Review of Systems  Constitutional: Positive for fever, chills and malaise/fatigue.  HENT: Negative for hearing loss and tinnitus.   Eyes: Negative for photophobia and pain.  Respiratory: Positive for shortness of breath. Negative for hemoptysis.   Cardiovascular: Positive for chest pain and palpitations.  Gastrointestinal: Negative for nausea, vomiting and abdominal pain.  Genitourinary: Positive for frequency. Negative for dysuria.       Positive for foul odor  Musculoskeletal: Negative for joint pain.  Skin: Negative for itching and rash.  Neurological: Positive for loss of consciousness and weakness. Negative for sensory change and speech change.  Psychiatric/Behavioral: Negative for hallucinations. The patient is not nervous/anxious.      Past Medical History  Diagnosis Date  . Left knee DJD   . ALLERGIC RHINITIS 06/24/2007  . DEPRESSION 10/08/2006  . DIABETES MELLITUS, TYPE II 10/05/2006  . GERD 06/24/2007  . HYPERLIPIDEMIA 10/08/2006  . HYPERTENSION 10/08/2006  . PEPTIC ULCER DISEASE 10/08/2006  . COLONIC POLYPS, HX OF 06/24/2007  . INSOMNIA 09/24/2008  . Anemia   . Heart murmur      Past Surgical History  Procedure Laterality Date  . Breast biopsy    . Appendectomy    . Tubal ligation    . Tonsillectomy        Social History:  reports that she has quit smoking. She does not have any smokeless tobacco history on file. She reports that she does not drink alcohol or use illicit drugs. Where does patient  live--home  and with whom if at home?family Can patient participate in ADLs? Yes  Allergies  Allergen Reactions  . Ibuprofen Other (See Comments)    Bleeding events    Family History  Problem Relation Age of Onset  . Cancer Mother     ENT cancer  . Alcohol abuse Other   . Arthritis Other     DJD  . Hyperlipidemia Other   . Heart disease Other   . Stroke  Other   . Hypertension Other   . Depression Other   . Diabetes Other        Prior to Admission medications   Medication Sig Start Date End Date Taking? Authorizing Provider  acetaminophen (TYLENOL) 500 MG tablet Take 500 mg by mouth every 6 (six) hours as needed for moderate pain.   Yes Historical Provider, MD  aspirin 81 MG EC tablet Take 81 mg by mouth at bedtime.    Yes Historical Provider, MD  clonazePAM (KLONOPIN) 0.5 MG tablet TAKE ONE TO TWO TABLETS BY MOUTH AT NIGHT AS NEEDED FOR SLEEP 11/20/14  Yes Corwin Levins, MD  glimepiride (AMARYL) 2 MG tablet Take 1 tablet (2 mg total) by mouth daily before breakfast. Patient taking differently: Take 2 mg by mouth every evening.  04/08/15  Yes Corwin Levins, MD  hydrochlorothiazide (HYDRODIURIL) 25 MG tablet Take 1 tablet (25 mg total) by mouth daily. Patient taking differently: Take 25 mg by mouth every evening.  08/11/14  Yes Corwin Levins, MD  irbesartan (AVAPRO) 150 MG tablet Take 1 tablet (150 mg total) by mouth daily. Patient taking differently: Take 150 mg by mouth every evening.  05/21/14  Yes Corwin Levins, MD  metFORMIN (GLUCOPHAGE) 1000 MG tablet 1 tab by mouth in the AM,and 1/2 tab in the PM Patient taking differently: Take 1,000 mg by mouth every evening.  04/01/15  Yes Corwin Levins, MD  omeprazole (PRILOSEC) 20 MG capsule Take 1 capsule (20 mg total) by mouth 2 (two) times daily. Patient taking differently: Take 20 mg by mouth every evening.  06/30/14  Yes Corwin Levins, MD  pravastatin (PRAVACHOL) 40 MG tablet Take 1 tablet (40 mg total) by mouth daily. Patient taking differently: Take 40 mg by mouth every evening.  04/14/15  Yes Corwin Levins, MD  ondansetron (ZOFRAN) 4 MG tablet Take 1 tablet (4 mg total) by mouth every 8 (eight) hours as needed for nausea or vomiting. 11/20/14   Corwin Levins, MD     Physical Exam: Filed Vitals:   05/17/15 2130 05/17/15 2145 05/17/15 2232 05/17/15 2330  BP: 115/49 125/58  102/41  Pulse: 116 110  98   Temp:   102.8 F (39.3 C) 98.5 F (36.9 C)  TempSrc:   Rectal Oral  Resp: 23 36  20  SpO2: 98% 100%  100%     Constitutional: Vital signs reviewed. Patient is a well-developed and well-nourished in no acute distress and cooperative with exam. Alert and oriented x3.  Head: Normocephalic and atraumatic  Ear: TM normal bilaterally  Mouth: no erythema or exudates, MMM  Eyes: PERRL, EOMI, conjunctivae normal, No scleral icterus.  Neck: Supple, Trachea midline normal ROM, No JVD, mass, thyromegaly, or carotid bruit present.  Cardiovascular: RRR, S1 normal, S2 normal, no MRG, pulses symmetric and intact bilaterally  Pulmonary/Chest: CTAB, no wheezes, rales, or rhonchi  Abdominal: Soft. Non-tender, non-distended, bowel sounds are normal, no masses, organomegaly, or guarding present.  GU: no CVA tenderness. Patient found  to have stool in the rectal vault no hemorrhoids appreciated. Guaiac-negative  Musculoskeletal: No joint deformities, erythema, or stiffness, ROM full and no nontender Ext: no edema and no cyanosis, pulses palpable bilaterally (DP and PT)  Hematology: no cervical, inginal, or axillary adenopathy.  Neurological: A&O x3, Strenght is normal and symmetric bilaterally, cranial nerve II-XII are grossly intact, no focal motor deficit, sensory intact to light touch bilaterally.  Skin: Warm, dry and intact. Pallor Psychiatric: Normal mood and affect. speech and behavior is normal. Judgment and thought content normal. Cognition and memory are normal.      Data Review   Micro Results No results found for this or any previous visit (from the past 240 hour(s)).  Radiology Reports Dg Chest Port 1 View  05/17/2015  CLINICAL DATA:  Chest pain and dyspnea.  Duration 1 day. EXAM: PORTABLE CHEST 1 VIEW COMPARISON:  05/20/2013 FINDINGS: A single AP portable view of the chest demonstrates no focal airspace consolidation or alveolar edema. The lungs are grossly clear. There is no large  effusion or pneumothorax. Cardiac and mediastinal contours appear unremarkable. IMPRESSION: No active disease. Electronically Signed   By: Ellery Plunk M.D.   On: 05/17/2015 21:17     CBC  Recent Labs Lab 05/17/15 2100  WBC 12.5*  HGB 5.0*  HCT 18.8*  PLT 233  MCV 70.9*  MCH 18.9*  MCHC 26.6*  RDW 17.9*  LYMPHSABS 0.5*  MONOABS 0.6  EOSABS 0.0  BASOSABS 0.0    Chemistries   Recent Labs Lab 05/17/15 2100  NA 135  K 3.6  CL 103  CO2 20*  GLUCOSE 173*  BUN 21*  CREATININE 1.23*  CALCIUM 9.2  AST 25  ALT 13*  ALKPHOS 60  BILITOT 1.2   ------------------------------------------------------------------------------------------------------------------ CrCl cannot be calculated (Unknown ideal weight.). ------------------------------------------------------------------------------------------------------------------ No results for input(s): HGBA1C in the last 72 hours. ------------------------------------------------------------------------------------------------------------------ No results for input(s): CHOL, HDL, LDLCALC, TRIG, CHOLHDL, LDLDIRECT in the last 72 hours. ------------------------------------------------------------------------------------------------------------------ No results for input(s): TSH, T4TOTAL, T3FREE, THYROIDAB in the last 72 hours.  Invalid input(s): FREET3 ------------------------------------------------------------------------------------------------------------------  Recent Labs  05/17/15 2209 05/17/15 2210  VITAMINB12  --  241  FOLATE 13.9  --   FERRITIN  --  4*  TIBC  --  434  IRON  --  21*  RETICCTPCT  --  1.8    Coagulation profile No results for input(s): INR, PROTIME in the last 168 hours.  No results for input(s): DDIMER in the last 72 hours.  Cardiac Enzymes No results for input(s): CKMB, TROPONINI, MYOGLOBIN in the last 168 hours.  Invalid input(s):  CK ------------------------------------------------------------------------------------------------------------------ Invalid input(s): POCBNP   CBG: No results for input(s): GLUCAP in the last 168 hours.     EKG: Independently reviewed. Sinus tachycardia   Assessment/Plan Sepsis secondary to a urinary tract infection: UA + with WBC of 12.5, and lactic acid of 3.5 on admission. - Admit to stepdown - Follow-up blood and urine cultures - Empiric antibiotics of Rocephin  Symptomatic anemia with reported syncopal episode: Hemoglobin 5 on admission patient with guaiac-negative stools. Suspect patient syncopal event occurred secondary to severe anemia and infection. - T&S and ordered to be transfused 2 units of blood  - Will check serial H&H and transfuse as as needed - Check electrolytes and replace as needed - May need GI consult   Chest pain - Trend cardiac troponins   Leukocytosis: Patient with a WBC count of 12.3 on admission fluid found to be negative. - Rechecking chest x-ray  in a.m.  Acute kidney injury: Patient's baseline creatinine is 0.8, but on admission creatinine elevated at 1.23 with a BUN of 21. Suspect prerenal in nature secondary to acute anemia. - Follow-up BMP in a.m.  Sinus tachycardia secondary to anemia - Continue to monitor  Iron deficiency - May consider placing patient on ferrous sulfate with vitamin C see if less GI upset  Essential hypertension  - Hold hydrochlorothiazide secondary to aki  Diabetes mellitus type 2 - Held metformin and Amaryl - Hypoglycemic protocols -CBGs every before meals and at bedtime with sensitive sliding scale of insulin   GERD - Protonix IV twice a day  SCDs for DVT prophylaxis for now   Code Status:   full Family Communication: bedside Disposition Plan: admit   Total time spent 55 minutes.Greater than 50% of this time was spent in counseling, explanation of diagnosis, planning of further management, and  coordination of care  [name] [facility] Pager [phone]  If 7PM-7AM, please contact night-coverage [email or web address] Password [email or web address] 05/17/2015, 11:58 PM

## 2015-05-18 ENCOUNTER — Inpatient Hospital Stay (HOSPITAL_COMMUNITY): Payer: Medicare Other

## 2015-05-18 DIAGNOSIS — I509 Heart failure, unspecified: Secondary | ICD-10-CM

## 2015-05-18 DIAGNOSIS — A419 Sepsis, unspecified organism: Secondary | ICD-10-CM | POA: Diagnosis present

## 2015-05-18 DIAGNOSIS — R0602 Shortness of breath: Secondary | ICD-10-CM

## 2015-05-18 DIAGNOSIS — R000 Tachycardia, unspecified: Secondary | ICD-10-CM | POA: Diagnosis present

## 2015-05-18 DIAGNOSIS — R079 Chest pain, unspecified: Secondary | ICD-10-CM | POA: Diagnosis present

## 2015-05-18 DIAGNOSIS — N179 Acute kidney failure, unspecified: Secondary | ICD-10-CM | POA: Diagnosis present

## 2015-05-18 DIAGNOSIS — R7989 Other specified abnormal findings of blood chemistry: Secondary | ICD-10-CM

## 2015-05-18 DIAGNOSIS — D649 Anemia, unspecified: Secondary | ICD-10-CM | POA: Diagnosis present

## 2015-05-18 DIAGNOSIS — D509 Iron deficiency anemia, unspecified: Secondary | ICD-10-CM

## 2015-05-18 DIAGNOSIS — N390 Urinary tract infection, site not specified: Secondary | ICD-10-CM

## 2015-05-18 LAB — CBC
HCT: 25.3 % — ABNORMAL LOW (ref 36.0–46.0)
HEMATOCRIT: 23.4 % — AB (ref 36.0–46.0)
Hemoglobin: 7 g/dL — ABNORMAL LOW (ref 12.0–15.0)
Hemoglobin: 7.4 g/dL — ABNORMAL LOW (ref 12.0–15.0)
MCH: 21.9 pg — AB (ref 26.0–34.0)
MCH: 21.7 pg — AB (ref 26.0–34.0)
MCHC: 29.9 g/dL — AB (ref 30.0–36.0)
MCHC: 29.2 g/dL — AB (ref 30.0–36.0)
MCV: 73.4 fL — AB (ref 78.0–100.0)
MCV: 74.2 fL — ABNORMAL LOW (ref 78.0–100.0)
PLATELETS: 195 10*3/uL (ref 150–400)
Platelets: 197 10*3/uL (ref 150–400)
RBC: 3.19 MIL/uL — ABNORMAL LOW (ref 3.87–5.11)
RBC: 3.41 MIL/uL — ABNORMAL LOW (ref 3.87–5.11)
RDW: 18.2 % — AB (ref 11.5–15.5)
RDW: 18.1 % — ABNORMAL HIGH (ref 11.5–15.5)
WBC: 13.4 10*3/uL — ABNORMAL HIGH (ref 4.0–10.5)
WBC: 12.5 10*3/uL — ABNORMAL HIGH (ref 4.0–10.5)

## 2015-05-18 LAB — TROPONIN I
Troponin I: 5.24 ng/mL (ref <0.031)
Troponin I: 6.98 ng/mL (ref <0.031)

## 2015-05-18 LAB — GLUCOSE, CAPILLARY
GLUCOSE-CAPILLARY: 122 mg/dL — AB (ref 65–99)
GLUCOSE-CAPILLARY: 154 mg/dL — AB (ref 65–99)
GLUCOSE-CAPILLARY: 163 mg/dL — AB (ref 65–99)
Glucose-Capillary: 121 mg/dL — ABNORMAL HIGH (ref 65–99)

## 2015-05-18 LAB — COMPREHENSIVE METABOLIC PANEL
ALK PHOS: 57 U/L (ref 38–126)
ALT: 19 U/L (ref 14–54)
ANION GAP: 11 (ref 5–15)
AST: 46 U/L — ABNORMAL HIGH (ref 15–41)
Albumin: 2.9 g/dL — ABNORMAL LOW (ref 3.5–5.0)
BILIRUBIN TOTAL: 2.9 mg/dL — AB (ref 0.3–1.2)
BUN: 22 mg/dL — ABNORMAL HIGH (ref 6–20)
CALCIUM: 8.7 mg/dL — AB (ref 8.9–10.3)
CO2: 19 mmol/L — ABNORMAL LOW (ref 22–32)
Chloride: 106 mmol/L (ref 101–111)
Creatinine, Ser: 1.16 mg/dL — ABNORMAL HIGH (ref 0.44–1.00)
GFR calc Af Amer: 52 mL/min — ABNORMAL LOW (ref >60)
GFR, EST NON AFRICAN AMERICAN: 45 mL/min — AB (ref >60)
Glucose, Bld: 154 mg/dL — ABNORMAL HIGH (ref 65–99)
POTASSIUM: 4 mmol/L (ref 3.5–5.1)
Sodium: 136 mmol/L (ref 135–145)
TOTAL PROTEIN: 6.3 g/dL — AB (ref 6.5–8.1)

## 2015-05-18 LAB — ECHOCARDIOGRAM COMPLETE
HEIGHTINCHES: 62 in
Weight: 3691.2 oz

## 2015-05-18 LAB — INFLUENZA PANEL BY PCR (TYPE A & B)
H1N1FLUPCR: NOT DETECTED
INFLBPCR: NEGATIVE
Influenza A By PCR: NEGATIVE

## 2015-05-18 LAB — HEMOGLOBIN AND HEMATOCRIT, BLOOD
HEMATOCRIT: 25 % — AB (ref 36.0–46.0)
HEMATOCRIT: 25.6 % — AB (ref 36.0–46.0)
HEMOGLOBIN: 7.3 g/dL — AB (ref 12.0–15.0)
HEMOGLOBIN: 7.5 g/dL — AB (ref 12.0–15.0)

## 2015-05-18 LAB — LACTATE DEHYDROGENASE: LDH: 186 U/L (ref 98–192)

## 2015-05-18 LAB — MRSA PCR SCREENING: MRSA by PCR: NEGATIVE

## 2015-05-18 LAB — PREPARE RBC (CROSSMATCH)

## 2015-05-18 LAB — PROCALCITONIN: PROCALCITONIN: 8.1 ng/mL

## 2015-05-18 IMAGING — DX DG CHEST 2V
2 series · 2 of 2 positions shown · non-contrast
Comparison: [DATE]

CLINICAL DATA: Sepsis, chest heaviness, shortness of breath

EXAM:
CHEST  2 VIEW

[chest pa]
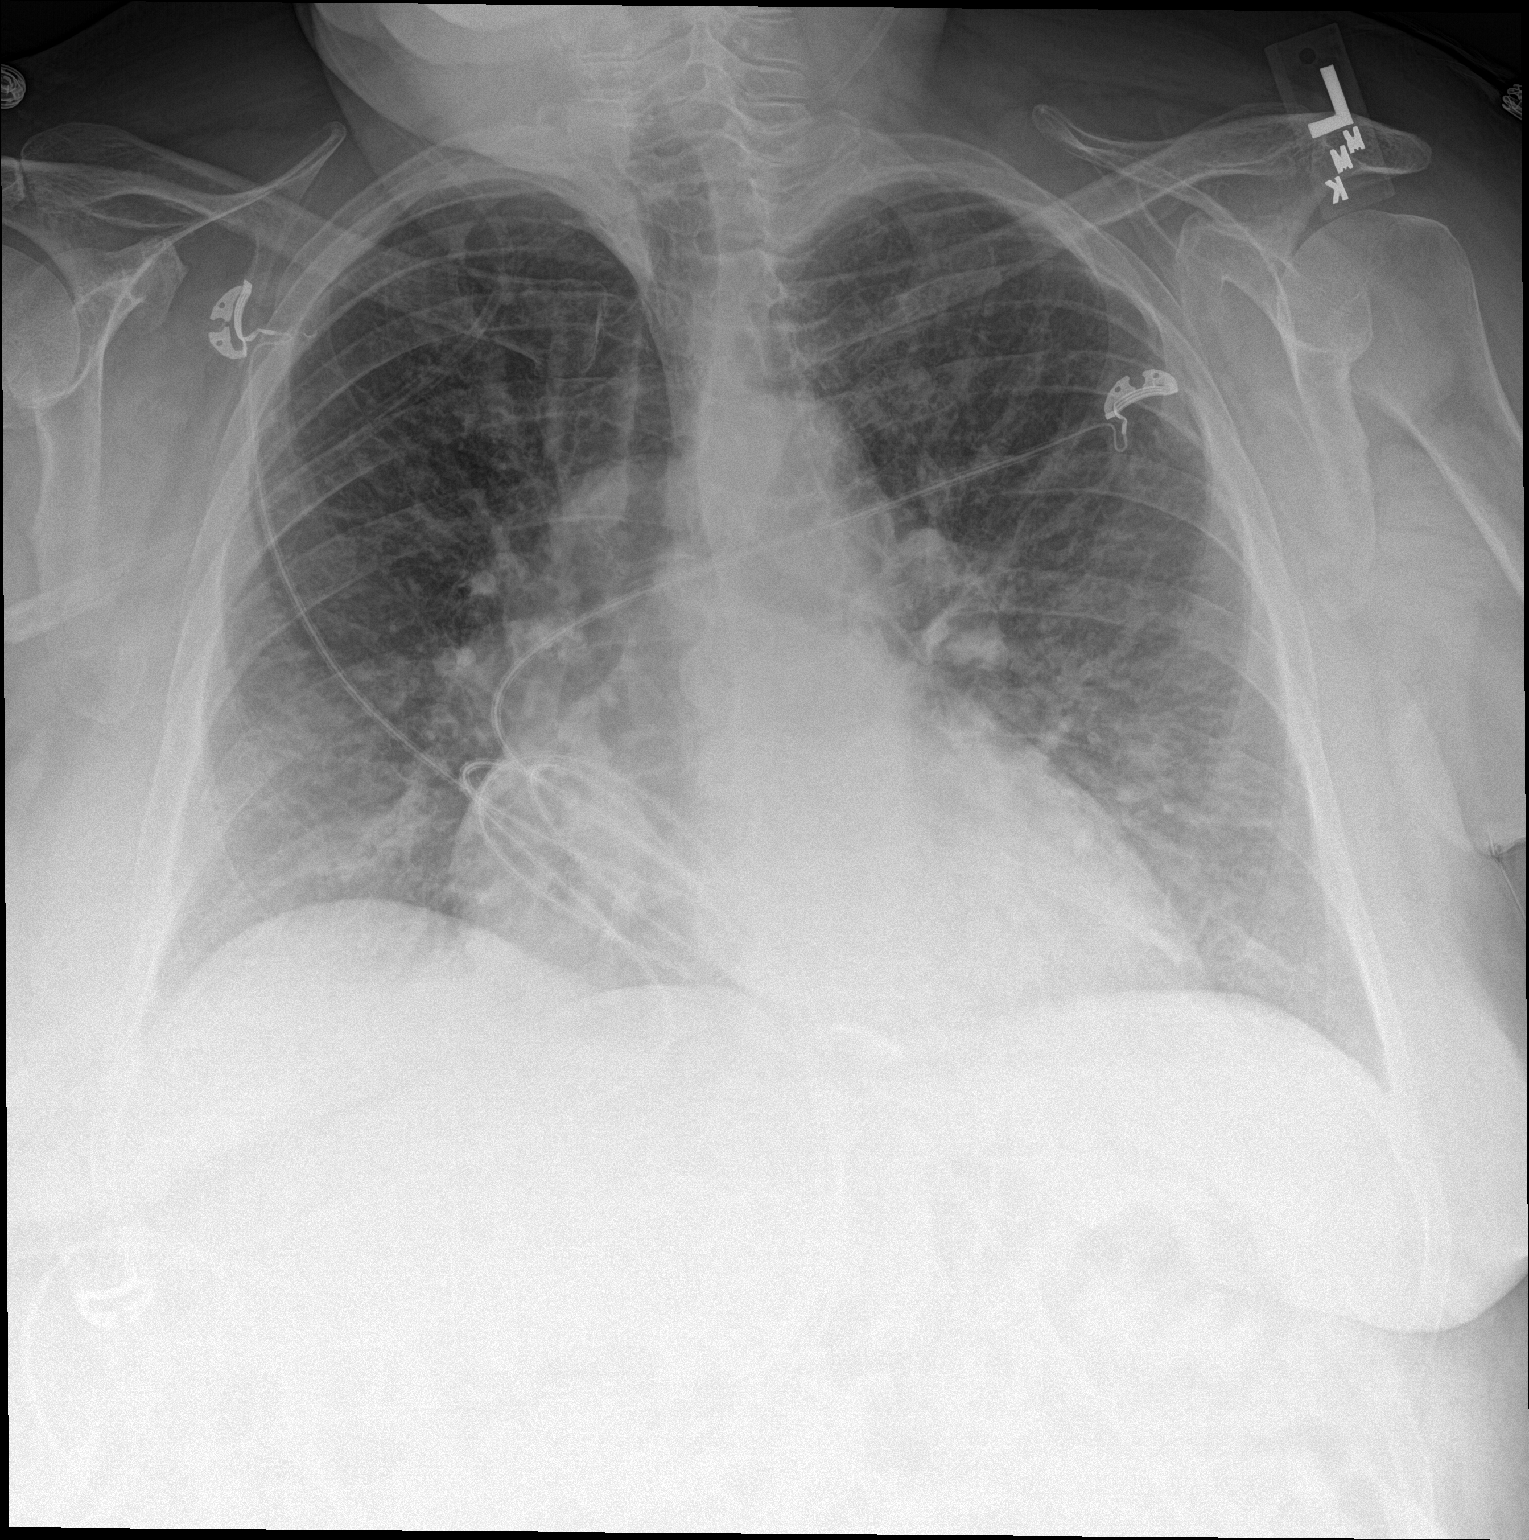

[chest lat]
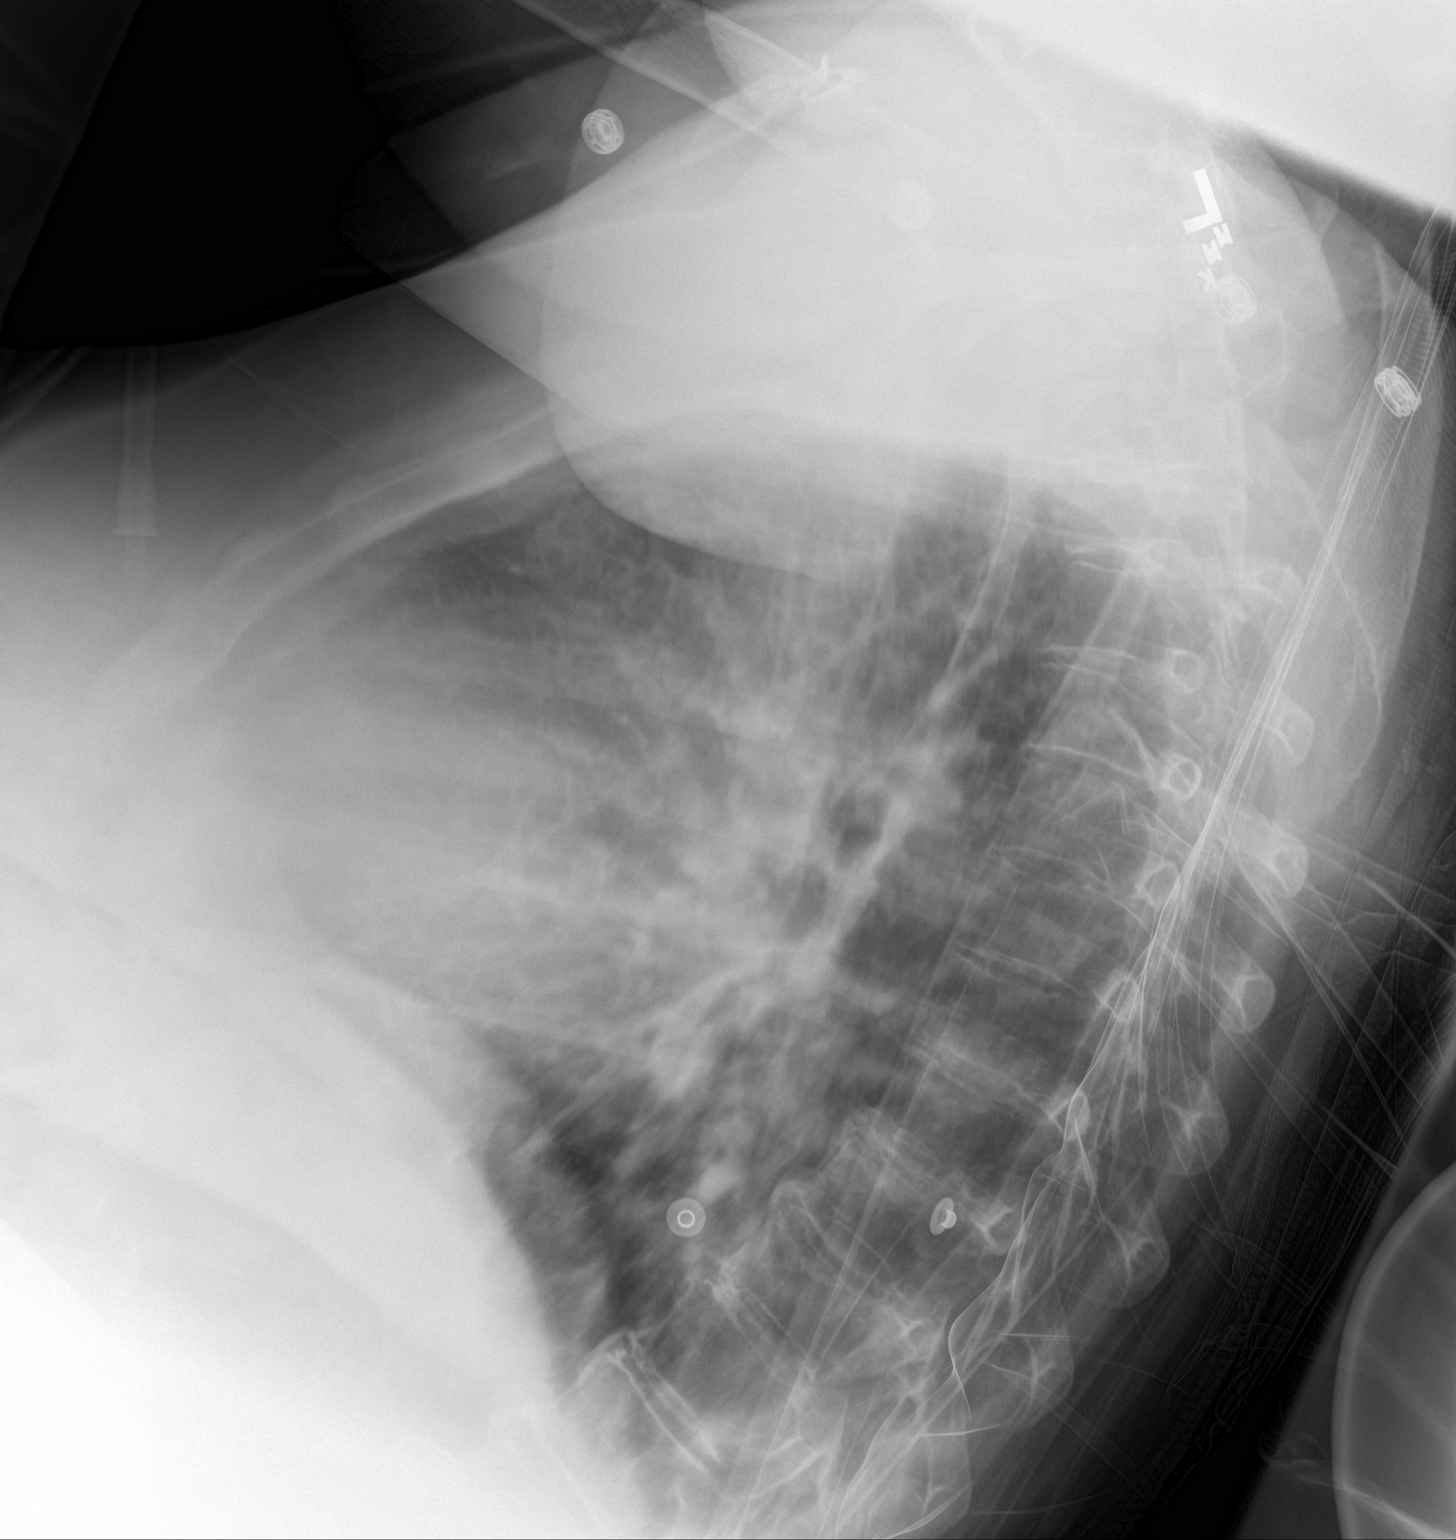

[2 of 2 positions shown; findings below may reference images not displayed]

FINDINGS: Cardiomegaly again noted. Central mild vascular congestion without
convincing pulmonary edema. No segmental infiltrate. Mild basilar
atelectasis. Mild degenerative changes thoracic spine.
IMPRESSION: Cardiomegaly. Central vascular congestion without convincing
pulmonary edema. Basilar atelectasis. Mild degenerative changes
thoracic spine.

## 2015-05-18 MED ORDER — DIPHENHYDRAMINE HCL 50 MG/ML IJ SOLN: 25.0000 mg | Freq: Four times a day (QID) | INTRAMUSCULAR | Status: DC | PRN

## 2015-05-18 MED ORDER — FERUMOXYTOL INJECTION 510 MG/17 ML
510.0000 mg | INTRAVENOUS | Status: DC
  Administered 2015-05-18: 510 mg via INTRAVENOUS
  Filled 2015-05-18: qty 17

## 2015-05-18 MED ORDER — PERFLUTREN LIPID MICROSPHERE
1.0000 mL | INTRAVENOUS | Status: AC | PRN
  Administered 2015-05-18: 3 mL via INTRAVENOUS
  Filled 2015-05-18: qty 10

## 2015-05-18 MED ORDER — INSULIN ASPART 100 UNIT/ML ~~LOC~~ SOLN
0.0000 [IU] | Freq: Three times a day (TID) | SUBCUTANEOUS | Status: DC
  Administered 2015-05-18: 1 [IU] via SUBCUTANEOUS
  Administered 2015-05-18: 2 [IU] via SUBCUTANEOUS
  Administered 2015-05-18 – 2015-05-19 (×2): 1 [IU] via SUBCUTANEOUS
  Administered 2015-05-19: 2 [IU] via SUBCUTANEOUS
  Administered 2015-05-19 – 2015-05-20 (×2): 1 [IU] via SUBCUTANEOUS
  Administered 2015-05-21: 2 [IU] via SUBCUTANEOUS
  Administered 2015-05-21: 1 [IU] via SUBCUTANEOUS
  Administered 2015-05-22 (×3): 2 [IU] via SUBCUTANEOUS
  Administered 2015-05-23: 3 [IU] via SUBCUTANEOUS
  Administered 2015-05-23: 1 [IU] via SUBCUTANEOUS

## 2015-05-18 MED ORDER — DEXTROSE 5 % IV SOLN
1.0000 g | INTRAVENOUS | Status: DC
  Administered 2015-05-18 – 2015-05-23 (×5): 1 g via INTRAVENOUS
  Filled 2015-05-18 (×6): qty 10

## 2015-05-18 MED ORDER — PERFLUTREN LIPID MICROSPHERE
INTRAVENOUS | Status: AC
  Filled 2015-05-18: qty 10

## 2015-05-18 MED ORDER — ACETAMINOPHEN 650 MG RE SUPP: 650.0000 mg | Freq: Four times a day (QID) | RECTAL | Status: DC | PRN

## 2015-05-18 MED ORDER — ONDANSETRON HCL 4 MG/2ML IJ SOLN
4.0000 mg | Freq: Four times a day (QID) | INTRAMUSCULAR | Status: DC | PRN
Start: 2015-05-18 — End: 2015-05-23
  Administered 2015-05-22: 4 mg via INTRAVENOUS
  Filled 2015-05-18: qty 2

## 2015-05-18 MED ORDER — ALBUTEROL SULFATE (2.5 MG/3ML) 0.083% IN NEBU
2.5000 mg | INHALATION_SOLUTION | RESPIRATORY_TRACT | Status: DC | PRN
  Administered 2015-05-19: 2.5 mg via RESPIRATORY_TRACT
  Filled 2015-05-18: qty 3

## 2015-05-18 MED ORDER — ONDANSETRON HCL 4 MG PO TABS: 4.0000 mg | ORAL_TABLET | Freq: Four times a day (QID) | ORAL | Status: DC | PRN

## 2015-05-18 MED ORDER — FUROSEMIDE 10 MG/ML IJ SOLN
20.0000 mg | Freq: Once | INTRAMUSCULAR | Status: AC
  Administered 2015-05-18: 20 mg via INTRAVENOUS
  Filled 2015-05-18: qty 2

## 2015-05-18 MED ORDER — PRAVASTATIN SODIUM 40 MG PO TABS
40.0000 mg | ORAL_TABLET | Freq: Every evening | ORAL | Status: DC
  Administered 2015-05-18: 40 mg via ORAL
  Filled 2015-05-18 (×2): qty 1

## 2015-05-18 MED ORDER — ACETAMINOPHEN 325 MG PO TABS
650.0000 mg | ORAL_TABLET | Freq: Four times a day (QID) | ORAL | Status: DC | PRN
  Administered 2015-05-18 – 2015-05-23 (×4): 650 mg via ORAL
  Filled 2015-05-18 (×4): qty 2

## 2015-05-18 MED ORDER — SODIUM CHLORIDE 0.9 % IV SOLN
Freq: Once | INTRAVENOUS | Status: AC
  Administered 2015-05-18: 12:00:00 via INTRAVENOUS

## 2015-05-18 MED ORDER — INSULIN ASPART 100 UNIT/ML ~~LOC~~ SOLN
0.0000 [IU] | Freq: Every day | SUBCUTANEOUS | Status: DC
  Administered 2015-05-19: 3 [IU] via SUBCUTANEOUS
  Administered 2015-05-21: 2 [IU] via SUBCUTANEOUS

## 2015-05-18 MED ORDER — DEXTROSE 5 % IV SOLN: 2.0000 g | Freq: Once | INTRAVENOUS | Status: DC

## 2015-05-18 MED ORDER — IPRATROPIUM BROMIDE 0.02 % IN SOLN
0.5000 mg | Freq: Four times a day (QID) | RESPIRATORY_TRACT | Status: DC
  Administered 2015-05-18 (×3): 0.5 mg via RESPIRATORY_TRACT
  Filled 2015-05-18 (×4): qty 2.5

## 2015-05-18 MED ORDER — CLONAZEPAM 0.5 MG PO TABS
0.5000 mg | ORAL_TABLET | Freq: Every day | ORAL | Status: DC
  Administered 2015-05-18 (×2): 0.5 mg via ORAL
  Filled 2015-05-18 (×2): qty 1

## 2015-05-18 MED ORDER — CETYLPYRIDINIUM CHLORIDE 0.05 % MT LIQD
7.0000 mL | Freq: Two times a day (BID) | OROMUCOSAL | Status: DC
  Administered 2015-05-19 – 2015-05-23 (×8): 7 mL via OROMUCOSAL

## 2015-05-18 MED ORDER — PANTOPRAZOLE SODIUM 40 MG IV SOLR
40.0000 mg | Freq: Two times a day (BID) | INTRAVENOUS | Status: DC
  Administered 2015-05-18 (×2): 40 mg via INTRAVENOUS
  Filled 2015-05-18 (×2): qty 40

## 2015-05-18 MED ORDER — PANTOPRAZOLE SODIUM 40 MG PO TBEC
40.0000 mg | DELAYED_RELEASE_TABLET | Freq: Every day | ORAL | Status: DC
  Administered 2015-05-19 – 2015-05-20 (×2): 40 mg via ORAL
  Filled 2015-05-18 (×3): qty 1

## 2015-05-18 MED ORDER — METOPROLOL TARTRATE 12.5 MG HALF TABLET
12.5000 mg | ORAL_TABLET | Freq: Two times a day (BID) | ORAL | Status: DC
  Administered 2015-05-18 – 2015-05-23 (×10): 12.5 mg via ORAL
  Filled 2015-05-18 (×10): qty 1

## 2015-05-18 MED ORDER — FERROUS SULFATE 325 (65 FE) MG PO TABS
325.0000 mg | ORAL_TABLET | Freq: Two times a day (BID) | ORAL | Status: DC
  Administered 2015-05-18: 325 mg via ORAL
  Filled 2015-05-18: qty 1

## 2015-05-18 MED ORDER — SODIUM CHLORIDE 0.9% FLUSH
3.0000 mL | Freq: Two times a day (BID) | INTRAVENOUS | Status: DC
Start: 2015-05-18 — End: 2015-05-23
  Administered 2015-05-18 – 2015-05-21 (×8): 3 mL via INTRAVENOUS

## 2015-05-18 MED ORDER — MORPHINE SULFATE (PF) 2 MG/ML IV SOLN: 2.0000 mg | INTRAVENOUS | Status: DC | PRN

## 2015-05-18 NOTE — Progress Notes (Signed)
Latest troponin- 5.24, temp-101.1 MD made aware, with order, continue to monitor.

## 2015-05-18 NOTE — Consult Note (Signed)
[facility] Consult: 12:28 PM 05/18/2015  LOS: 1 day    Referring Provider: Dr Candiss Norse  Primary Care Physician:  Cathlean Cower, MD Primary Gastroenterologist:  Dr. Verl Blalock.     Reason for Consultation:  Iron def anemia.    HPI: Mary Mccarthy is a 75 y.o. female.  Type 2 DM, on oral agents.  Obese.   Peptic ulcer disease (2008?).  Colon polyps (2009?).   GERD, on Omeprazole BID.   12/2009 Colonoscopy for hx polyps.  Normal study.   Pt cancelled appts with Dr Henrene Pastor for anemia in 07/2013 and 08/2013.  Hgb was down to 7.7 in 05/2013, 9.6 in 06/2013, 8.5 in 11/2014. She was on po iron dating to before 06/2013 but stopped this greater than 1 year ago as it caused diarrhea.  .   2 months progressive DOE, fatigue with effort.  + intermittent palpitations.  + dry cough.  Exertional pain in the left scapular area. Yesterday she was so weak, her son needed to lift her to move her.  Notes say this was syncope with a fall, though she does not recall son picking her up or LOC.   In ED temp 102.8, tachy to 120s, resps to 36.  BP 103/50.   ? UTI: on Rocephin.  Hgb 5 (now 7 after PRBC x 3).  Was 12.5, then 8.5 in mid 11/2013.  No interim CBCs. Ferritin 4.   MCV low at 73. + AKI vs CKD.   LFTs with t bili 2.9. Alk phos 57, AST/ALT 46/19 Troponins to 5.24.  FOBT negative.    In addition to PRBCs, Ferraheme ordered and oral iron initiated. .    Pt denies BPR, melena, nausea, dysphagia.  + 2 months anorexia/early satiety.  No abd pain. Takes aleve or ibuprofen ~ 2 to 3 times per month.  On daily 81 ASA.  Compliant with daily omeprazole and adds second dose rarely if she has heartburn predictably triggered by certain foods.  Regular daily brown BMs, no melena, black, bloody stool.  Recall blood transfusion age 51, during  pregnancy.      Past Medical History  Diagnosis Date  . Left knee DJD   . ALLERGIC RHINITIS 06/24/2007  . DEPRESSION 10/08/2006  . DIABETES MELLITUS, TYPE II 10/05/2006  . GERD 06/24/2007  . HYPERLIPIDEMIA 10/08/2006  . HYPERTENSION 10/08/2006  . PEPTIC ULCER DISEASE 10/08/2006  . COLONIC POLYPS, HX OF 06/24/2007  . INSOMNIA 09/24/2008  . Anemia   . Heart murmur     Past Surgical History  Procedure Laterality Date  . Breast biopsy    . Appendectomy    . Tubal ligation    . Tonsillectomy      Prior to Admission medications   Medication Sig Start Date End Date Taking? Authorizing Provider  acetaminophen (TYLENOL) 500 MG tablet Take 500 mg by mouth every 6 (six) hours as needed for moderate pain.   Yes Historical Provider, MD  aspirin 81 MG EC tablet Take 81 mg by mouth at bedtime.    Yes Historical Provider, MD  clonazePAM (KLONOPIN) 0.5 MG tablet TAKE ONE TO TWO TABLETS BY MOUTH AT NIGHT AS NEEDED FOR SLEEP 11/20/14  Yes Corwin Levins, MD  glimepiride (AMARYL) 2 MG tablet Take 1 tablet (2 mg total) by mouth daily before breakfast. Patient taking differently: Take 2 mg by mouth every evening.  04/08/15  Yes Corwin Levins, MD  hydrochlorothiazide (HYDRODIURIL) 25 MG tablet Take 1 tablet (25 mg total) by mouth daily. Patient taking differently: Take 25 mg by mouth every evening.  08/11/14  Yes Corwin Levins, MD  irbesartan (AVAPRO) 150 MG tablet Take 1 tablet (150 mg total) by mouth daily. Patient taking differently: Take 150 mg by mouth every evening.  05/21/14  Yes Corwin Levins, MD  metFORMIN (GLUCOPHAGE) 1000 MG tablet 1 tab by mouth in the AM,and 1/2 tab in the PM Patient taking differently: Take 1,000 mg by mouth every evening.  04/01/15  Yes Corwin Levins, MD  omeprazole (PRILOSEC) 20 MG capsule Take 1 capsule (20 mg total) by mouth 2 (two) times daily. Patient taking differently: Take 20 mg by mouth every evening.  06/30/14  Yes Corwin Levins, MD  pravastatin (PRAVACHOL) 40 MG tablet Take 1 tablet  (40 mg total) by mouth daily. Patient taking differently: Take 40 mg by mouth every evening.  04/14/15  Yes Corwin Levins, MD  ondansetron (ZOFRAN) 4 MG tablet Take 1 tablet (4 mg total) by mouth every 8 (eight) hours as needed for nausea or vomiting. 11/20/14   Corwin Levins, MD    Scheduled Meds: . sodium chloride  10 mL/hr Intravenous Once  . sodium chloride   Intravenous Once  . [START ON 05/19/2015] antiseptic oral rinse  7 mL Mouth Rinse BID  . cefTRIAXone (ROCEPHIN)  IV  1 g Intravenous Q24H  . clonazePAM  0.5-1 mg Oral QHS  . ferrous sulfate  325 mg Oral BID WC  . ferumoxytol  510 mg Intravenous Weekly  . furosemide  20 mg Intravenous Once  . insulin aspart  0-5 Units Subcutaneous QHS  . insulin aspart  0-9 Units Subcutaneous TID WC  . ipratropium  0.5 mg Nebulization Q6H  . metoprolol tartrate  12.5 mg Oral BID  . pantoprazole (PROTONIX) IV  40 mg Intravenous Q12H  . pravastatin  40 mg Oral QPM  . sodium chloride flush  3 mL Intravenous Q12H   Infusions:   PRN Meds: acetaminophen **OR** [DISCONTINUED] acetaminophen, albuterol, diphenhydrAMINE, morphine injection, [DISCONTINUED] ondansetron **OR** ondansetron (ZOFRAN) IV   Allergies as of 05/17/2015 - Review Complete 05/17/2015  Allergen Reaction Noted  . Ibuprofen Other (See Comments)     Family History  Problem Relation Age of Onset  . Cancer Mother     ENT cancer  . Alcohol abuse Other   . Arthritis Other     DJD  . Hyperlipidemia Other   . Heart disease Other   . Stroke Other   . Hypertension Other   . Depression Other   . Diabetes Other     Social History   Social History  . Marital Status: Married    Spouse Name: N/A  . Number of Children: 1  . Years of Education: N/A   Occupational History  . retired - Buyer, retail    Social History Main Topics  . Smoking status: Former Games developer  . Smokeless tobacco: Not on file  . Alcohol Use: No  . Drug Use: No  . Sexual Activity: Not on file    Other  Topics Concern  . Not on file   Social History Narrative    REVIEW OF SYSTEMS: Constitutional:  Weight stable ENT:  No nose bleeds Pulm:  Per HPI.   CV:  No palpitations, no LE edema.  GU:  No hematuria, no frequency GI:  Per HPI.  No dysphagia Heme:  Per HPI   Transfusions:  Per HPI Neuro:  No headaches, no peripheral tingling or numbness Derm:  No itching, no rash or sores.  Endocrine:  No sweats or chills.  No polyuria or dysuria Immunization:  Reviewed, up to date on flu etc Travel:  None beyond local counties in last few months.    PHYSICAL EXAM: Vital signs in last 24 hours: Filed Vitals:   05/18/15 0841 05/18/15 1100  BP:  110/50  Pulse:  65  Temp: 101.1 F (38.4 C) 97.8 F (36.6 C)  Resp:  28   Wt Readings from Last 3 Encounters:  05/18/15 104.645 kg (230 lb 11.2 oz)  11/20/14 100.245 kg (221 lb)  05/21/14 101.606 kg (224 lb)    General: pleasant, obese, pale and somewhat ill looking WF Head:  No trauma, swelling or asymmetry  Eyes:  No icterus.  + pallor Ears:  Not HOH  Nose:  No congestion Mouth:  Moist, clear MM Neck:  No mass, no JVD, no TMG or masses Lungs:  Diminished BS but clear.  Dry, paroxysmal cough Heart: RRR in 70s, 2 to 3/6 SEM.   Abdomen:  Obese, soft, NT, ND.  No mass, no HSM, no hernia.  BS normal but reduced.   Rectal: no mass, stool is brown and FOBT negative   Musc/Skeltl: no joint redness or swelling Extremities:  No CCE.  Feet warm.   Neurologic:  Oriented x 3.  No tremor.  No gross deficits Skin:  No telangectasia, no sores, no rash Tattoos:  none   Psych:  Pleasant, cooperative, calm.  Good historian.   Intake/Output from previous day: 04/10 0701 - 04/11 0700 In: 670 [Blood:670] Out: -  Intake/Output this shift: Total I/O In: 50 [P.O.:50] Out: -   LAB RESULTS:  Recent Labs  05/17/15 2100 05/18/15 0748  WBC 12.5* 13.4*  HGB 5.0* 7.0*  HCT 18.8* 23.4*  PLT 233 197   BMET Lab Results  Component Value  Date   NA 136 05/18/2015   NA 135 05/17/2015   NA 137 11/20/2014   K 4.0 05/18/2015   K 3.6 05/17/2015   K 4.1 11/20/2014   CL 106 05/18/2015   CL 103 05/17/2015   CL 102 11/20/2014   CO2 19* 05/18/2015   CO2 20* 05/17/2015   CO2 29 11/20/2014   GLUCOSE 154* 05/18/2015   GLUCOSE 173* 05/17/2015   GLUCOSE 114* 11/20/2014   BUN 22* 05/18/2015   BUN 21* 05/17/2015   BUN 23 11/20/2014   CREATININE 1.16* 05/18/2015   CREATININE 1.23* 05/17/2015   CREATININE 0.85 11/20/2014   CALCIUM 8.7* 05/18/2015   CALCIUM 9.2 05/17/2015   CALCIUM 9.9 11/20/2014   LFT  Recent Labs  05/17/15 2100 05/18/15 0748  PROT 6.3* 6.3*  ALBUMIN 3.1* 2.9*  AST 25 46*  ALT 13* 19  ALKPHOS 60 57  BILITOT 1.2 2.9*   PT/INR No results found for: INR, PROTIME Hepatitis Panel No results for input(s): HEPBSAG, HCVAB, HEPAIGM, HEPBIGM in the last 72 hours. C-Diff No components found for: CDIFF Lipase  No results found for: LIPASE  Drugs of Abuse  No results found for: LABOPIA, COCAINSCRNUR, LABBENZ,  AMPHETMU, THCU, LABBARB   RADIOLOGY STUDIES: X-ray Chest Pa And Lateral  05/18/2015  CLINICAL DATA:  Sepsis, chest heaviness, shortness of breath EXAM: CHEST  2 VIEW COMPARISON:  05/17/2015 FINDINGS: Cardiomegaly again noted. Central mild vascular congestion without convincing pulmonary edema. No segmental infiltrate. Mild basilar atelectasis. Mild degenerative changes thoracic spine. IMPRESSION: Cardiomegaly. Central vascular congestion without convincing pulmonary edema. Basilar atelectasis. Mild degenerative changes thoracic spine. Electronically Signed   By: Lahoma Crocker M.D.   On: 05/18/2015 08:04   Dg Chest Port 1 View  05/17/2015  CLINICAL DATA:  Chest pain and dyspnea.  Duration 1 day. EXAM: PORTABLE CHEST 1 VIEW COMPARISON:  05/20/2013 FINDINGS: A single AP portable view of the chest demonstrates no focal airspace consolidation or alveolar edema. The lungs are grossly clear. There is no large  effusion or pneumothorax. Cardiac and mediastinal contours appear unremarkable. IMPRESSION: No active disease. Electronically Signed   By: Andreas Newport M.D.   On: 05/17/2015 21:17    ENDOSCOPIC STUDIES: Per HPI  IMPRESSION:   *  FOBT negative, iron deficiency anemia.  Hx of same dates to 2015.  Did not tolerated po iron.   PRBCs x 2, 3rd going now.    *  Elevated troponins.  Cards has seen.  ekg changes and progressive sxs (dyspnea and left scapular exertional pain) over 2 months. Considering cath. Echo pending.  Previous EF 65 to 70% 11/2013.    *  Fever.     PLAN:     *   Stopped the oral iron, th IV iron should suffice for now and po iron will confuse appearance of stools re bleeding and FOBT status.   Switched to oral Protonix.  Started diet.    *  Probably needs egd/colon but with FOBT negative status, no urgency.     Azucena Freed  05/18/2015, 12:28 PM Pager: [phone]

## 2015-05-18 NOTE — Care Management Note (Signed)
Case Management Note  Patient Details  Name: Mary Mccarthy MRN: [id number] Date of Birth: [birth year]  Subjective/Objective:       Adm w sepsis             Action/Plan: lives w husband, pcp dr Jonny Ruiz   Expected Discharge Date:                  Expected Discharge Plan:  Home w Home Health Services  In-House Referral:     Discharge planning Services     Post Acute Care Choice:    Choice offered to:     DME Arranged:    DME Agency:     HH Arranged:    HH Agency:     Status of Service:     Medicare Important Message Given:    Date Medicare IM Given:    Medicare IM give by:    Date Additional Medicare IM Given:    Additional Medicare Important Message give by:     If discussed at Long Length of Stay Meetings, dates discussed:    Additional Comments: ur review done  Hanley Hays, RN 05/18/2015, 7:47 AM

## 2015-05-18 NOTE — Consult Note (Signed)
CARDIOLOGY CONSULT NOTE   Patient ID: Mary Mccarthy MRN: [id number] DOB/AGE: [birth year] 75 y.o.  Admit date: 05/17/2015  Primary Physician   [name], MD Primary Cardiologist   New Reason for Consultation Elevated Trop Referring MD Dr. Smith/ Internal Medicine  HPI:  Mary Mccarthy is a 75 y.o female with a past medical hx of PUD/HTN/HLD/DM II/ GERD/ depression. New to the cardiology practice. States she has generally been in pretty good health in the months leading up to her symptoms. She reports about 2 months ago she developed a harsh non-productive cough that persisted, along with increasing generalized weakness. She currently lives at home with her husband and performs all of her ADLs independently. Over the past 2 months she has had worsening dyspnea and fatigue with ADLs and walking, stating she can barely walk 10 ft without having to sit can catch her breath and regain her strength. Also, does have chest pain but only with this harsh cough, and a dull throbbing pain in the left shoulder blade when coughing. This episodes of SOB and fatigue do improve when she sits to rest. Denies lower extremity edema  Her family called EMS on 05/17/2015 due to her inability to get up off the couch to use the bathroom, son had to lift her from the couch. She was transported in the [facility] ED, given 325 ASA in route because she complained of chest pain at the time. EKG showed diffuse ST depressions. On arrival she was called a Code Sepsis due to her elevated HR, Hypotension, and elevated temp.  Initial Labs: WBC 12.5, Hbg 5.0, Lactic Acid 3.50, Trop 0.37, and Cr 1.23. Treated with IV fluids, antibiotics, and transfused 2 units.   Today she reports feeling much better, with no recurrent episodes of chest pain, or SOB. Trop this am has increased to 5.24, Cr improved to 1.16, Hgb 7.0. EKG this am shows SR with improved inferolateral ST depressions. Echo has been ordered, with results pending.    Cardiology has been consulted related to up trend in Trop.   Past Medical History  Diagnosis Date  . Left knee DJD   . ALLERGIC RHINITIS 06/24/2007  . DEPRESSION 10/08/2006  . DIABETES MELLITUS, TYPE II 10/05/2006  . GERD 06/24/2007  . HYPERLIPIDEMIA 10/08/2006  . HYPERTENSION 10/08/2006  . PEPTIC ULCER DISEASE 10/08/2006  . COLONIC POLYPS, HX OF 06/24/2007  . INSOMNIA 09/24/2008  . Anemia   . Heart murmur      Past Surgical History  Procedure Laterality Date  . Breast biopsy    . Appendectomy    . Tubal ligation    . Tonsillectomy      Allergies  Allergen Reactions  . Ibuprofen Other (See Comments)    Bleeding events    I have reviewed the patient's current medications . sodium chloride  10 mL/hr Intravenous Once  . [START ON 05/19/2015] antiseptic oral rinse  7 mL Mouth Rinse BID  . cefTRIAXone (ROCEPHIN)  IV  1 g Intravenous Q24H  . clonazePAM  0.5-1 mg Oral QHS  . ferrous sulfate  325 mg Oral BID WC  . ferumoxytol  510 mg Intravenous Weekly  . insulin aspart  0-5 Units Subcutaneous QHS  . insulin aspart  0-9 Units Subcutaneous TID WC  . ipratropium  0.5 mg Nebulization Q6H  . metoprolol tartrate  12.5 mg Oral BID  . pantoprazole (PROTONIX) IV  40 mg Intravenous Q12H  . pravastatin  40 mg Oral QPM  . sodium chloride  flush  3 mL Intravenous Q12H     acetaminophen **OR** [DISCONTINUED] acetaminophen, albuterol, morphine injection, [DISCONTINUED] ondansetron **OR** ondansetron (ZOFRAN) IV  Prior to Admission medications   Medication Sig Start Date End Date Taking? Authorizing Provider  acetaminophen (TYLENOL) 500 MG tablet Take 500 mg by mouth every 6 (six) hours as needed for moderate pain.   Yes Historical Provider, MD  aspirin 81 MG EC tablet Take 81 mg by mouth at bedtime.    Yes Historical Provider, MD  clonazePAM (KLONOPIN) 0.5 MG tablet TAKE ONE TO TWO TABLETS BY MOUTH AT NIGHT AS NEEDED FOR SLEEP 11/20/14  Yes Corwin Levins, MD  glimepiride (AMARYL) 2 MG tablet  Take 1 tablet (2 mg total) by mouth daily before breakfast. Patient taking differently: Take 2 mg by mouth every evening.  04/08/15  Yes Corwin Levins, MD  hydrochlorothiazide (HYDRODIURIL) 25 MG tablet Take 1 tablet (25 mg total) by mouth daily. Patient taking differently: Take 25 mg by mouth every evening.  08/11/14  Yes Corwin Levins, MD  irbesartan (AVAPRO) 150 MG tablet Take 1 tablet (150 mg total) by mouth daily. Patient taking differently: Take 150 mg by mouth every evening.  05/21/14  Yes Corwin Levins, MD  metFORMIN (GLUCOPHAGE) 1000 MG tablet 1 tab by mouth in the AM,and 1/2 tab in the PM Patient taking differently: Take 1,000 mg by mouth every evening.  04/01/15  Yes Corwin Levins, MD  omeprazole (PRILOSEC) 20 MG capsule Take 1 capsule (20 mg total) by mouth 2 (two) times daily. Patient taking differently: Take 20 mg by mouth every evening.  06/30/14  Yes Corwin Levins, MD  pravastatin (PRAVACHOL) 40 MG tablet Take 1 tablet (40 mg total) by mouth daily. Patient taking differently: Take 40 mg by mouth every evening.  04/14/15  Yes Corwin Levins, MD  ondansetron (ZOFRAN) 4 MG tablet Take 1 tablet (4 mg total) by mouth every 8 (eight) hours as needed for nausea or vomiting. 11/20/14   Corwin Levins, MD     Social History   Social History  . Marital Status: Married    Spouse Name: N/A  . Number of Children: 1  . Years of Education: N/A   Occupational History  . retired - Buyer, retail    Social History Main Topics  . Smoking status: Former Games developer  . Smokeless tobacco: Not on file  . Alcohol Use: No  . Drug Use: No  . Sexual Activity: Not on file   Other Topics Concern  . Not on file   Social History Narrative    Family Status  Relation Status Death Age  . Other Alive    Family History  Problem Relation Age of Onset  . Cancer Mother     ENT cancer  . Alcohol abuse Other   . Arthritis Other     DJD  . Hyperlipidemia Other   . Heart disease Other   . Stroke Other   .  Hypertension Other   . Depression Other   . Diabetes Other      ROS:  Full 14 point review of systems complete and found to be negative unless listed above.  Physical Exam: Blood pressure 131/52, pulse 91, temperature 101.1 F (38.4 C), temperature source Axillary, resp. rate 12, height 5\' 2"  (1.575 m), weight 230 lb 11.2 oz (104.645 kg), SpO2 100 %.  General: Well developed, well nourished, female in no acute distress Head: Eyes PERRLA, No xanthomas.  Normocephalic and atraumatic, oropharynx without edema or exudate.   Lungs:  Heart: HRRR S1 S2, no rub/gallop, Heart regular rate and rhythm with S1, S2  3/6 systolic murmur. pulses are 2+ extrem.   Neck: No carotid bruits. No lymphadenopathy. No JVD. Abdomen: Bowel sounds present, abdomen soft and non-tender without masses or hernias noted. Msk:  No spine or cva tenderness. No weakness, no joint deformities or effusions. Extremities: No clubbing or cyanosis.  edema.  Neuro: Alert and oriented X 3. No focal deficits noted. Psych:  Good affect, responds appropriately Skin: No rashes or lesions noted.  Labs:   Lab Results  Component Value Date   WBC 13.4* 05/18/2015   HGB 7.0* 05/18/2015   HCT 23.4* 05/18/2015   MCV 73.4* 05/18/2015   PLT 197 05/18/2015   No results for input(s): INR in the last 72 hours.  Recent Labs Lab 05/18/15 0748  NA 136  K 4.0  CL 106  CO2 19*  BUN 22*  CREATININE 1.16*  CALCIUM 8.7*  PROT 6.3*  BILITOT 2.9*  ALKPHOS 57  ALT 19  AST 46*  GLUCOSE 154*  ALBUMIN 2.9*   No results found for: MG  Recent Labs  05/18/15 0750  TROPONINI 5.24*    Recent Labs  05/17/15 2108  TROPIPOC 0.37*   B NATRIURETIC PEPTIDE  Date/Time Value Ref Range Status  05/17/2015 09:41 PM 168.3* 0.0 - 100.0 pg/mL Final   Lab Results  Component Value Date   CHOL 104 11/20/2014   HDL 35.10* 11/20/2014   LDLCALC 53 11/20/2014   TRIG 80.0 11/20/2014   No results found for: DDIMER No results found for:  LIPASE, AMYLASE TSH  Date/Time Value Ref Range Status  11/20/2014 12:16 PM 2.80 0.35 - 4.50 uIU/mL Final   VITAMIN B-12  Date/Time Value Ref Range Status  05/17/2015 10:10 PM 241 180 - 914 pg/mL Final    Comment:    (NOTE) This assay is not validated for testing neonatal or myeloproliferative syndrome specimens for Vitamin B12 levels.    FOLATE  Date/Time Value Ref Range Status  05/17/2015 10:09 PM 13.9 >5.9 ng/mL Final   FERRITIN  Date/Time Value Ref Range Status  05/17/2015 10:10 PM 4* 11 - 307 ng/mL Final   TIBC  Date/Time Value Ref Range Status  05/17/2015 10:10 PM 434 250 - 450 ug/dL Final   IRON  Date/Time Value Ref Range Status  05/17/2015 10:10 PM 21* 28 - 170 ug/dL Final   RETIC CT PCT  Date/Time Value Ref Range Status  05/17/2015 10:10 PM 1.8 0.4 - 3.1 % Final    Echo:   LV EF: 65% -  70%  ------------------------------------------------------------------- Indications:   785.2 Cardiac murmur (R01.1).  ------------------------------------------------------------------- History:  PMH: Acquired from the patient and from the patient&'s chart. Risk factors: Hypertension. Diabetes mellitus. Dyslipidemia.  ------------------------------------------------------------------- Study Conclusions  - Left ventricle: The cavity size was normal. There was mild focal basal and mild concentric hypertrophy of the septum. Systolic function was vigorous. The estimated ejection fraction was in the range of 65% to 70%. Wall motion was normal; there were no regional wall motion abnormalities. Doppler parameters are consistent with abnormal left ventricular relaxation (grade 1 diastolic dysfunction). - Aortic valve: There was mild stenosis. Mean gradient (S): 16 mm Hg. Valve area (Vmean): 1.56 cm^2. - Mitral valve: Calcified annulus. - Left atrium: The atrium was mildly dilated. - Pulmonary arteries: Systolic pressure was mildly increased.  PA peak pressure: 33 mm Hg (S).   ECG:  SR 84, improved inferolateral ST changes from 05/17/2015  Radiology:  X-ray Chest Pa And Lateral  05/18/2015  CLINICAL DATA:  Sepsis, chest heaviness, shortness of breath EXAM: CHEST  2 VIEW COMPARISON:  05/17/2015 FINDINGS: Cardiomegaly again noted. Central mild vascular congestion without convincing pulmonary edema. No segmental infiltrate. Mild basilar atelectasis. Mild degenerative changes thoracic spine. IMPRESSION: Cardiomegaly. Central vascular congestion without convincing pulmonary edema. Basilar atelectasis. Mild degenerative changes thoracic spine. Electronically Signed   By: Natasha Mead M.D.   On: 05/18/2015 08:04   Dg Chest Port 1 View  05/17/2015  CLINICAL DATA:  Chest pain and dyspnea.  Duration 1 day. EXAM: PORTABLE CHEST 1 VIEW COMPARISON:  05/20/2013 FINDINGS: A single AP portable view of the chest demonstrates no focal airspace consolidation or alveolar edema. The lungs are grossly clear. There is no large effusion or pneumothorax. Cardiac and mediastinal contours appear unremarkable. IMPRESSION: No active disease. Electronically Signed   By: Ellery Plunk M.D.   On: 05/17/2015 21:17    ASSESSMENT AND PLAN:     1. Elevated Troponin: -Trops have increased from 0.37>>5.24, given the patients report over worsening symptoms over the past 2 months, EKG changes and episode of chest pain along with extreme weakness yesterday, would consider a cath -Pt needs to be worked up from GI stand point to determine treatment, considering she has receiving 2 units of PRBC and hgb remains at 7, prior to cardiology intervention and antiplatelet therapy -Will discuss with Dr. Eldridge Dace regarding treatment plan -Previous Echo 11/28/2013 EF 65-70%, NWMA, mild AS. Echo from today is pending  2. HTN -Controlled on current medication, BB  3. DM II -On SSI  4. HLD -Currently on pravastatin, last lipid profile ok 11/2014   Signed: Laverda Page  NP-C 05/18/2015 11:11 AM   Co-Sign MD  I have examined the patient and reviewed assessment and plan and discussed with patient.  Agree with above as stated.  Not a candidate fro invasive cardiac w/u until bleeding issue is addressed or ruled out.  Elevated Troponin may be from anemia.  Await echo result. Continue supportive care.  If she has anginal sx once her Hbg is normal, could at least pursue lexiscan myoview. Would not do a stress test while the patietn is anemic as there is a high false positive likelihood.   Dearion Huot S.

## 2015-05-18 NOTE — Progress Notes (Signed)
Echocardiogram 2D Echocardiogram has been performed.  Dorothey Baseman 05/18/2015, 3:02 PM

## 2015-05-18 NOTE — Progress Notes (Signed)
Pharmacy Consult Re: Feraheme  Asked by Dr. Thedore Mins to dose Feraheme  Anemia panel: Iron 21 Tsat 5% Ferritin 4  Lab Results  Component Value Date   HGB 5.0* 05/17/2015     Plan: Will order Feraheme 510 mg x 2 doses, given 1 week apart. Pharmacy will sign-off, please contact with questions.   Thanks!  Tad Moore, BCPS  Clinical Pharmacist Pager [phone]  05/18/2015 7:22 AM

## 2015-05-18 NOTE — Progress Notes (Signed)
Pharmacy Antibiotic Note  Mary Mccarthy is a 75 y.o. female admitted on 05/17/2015 with UTI.  Pharmacy has been consulted for Rocephin dosing. Received 1gm Rocephin in ED ~2300.  Plan: Rocephin 1gm IV q24h Pharmacy will sign off - please reconsult if needed     Temp (24hrs), Avg:100.2 F (37.9 C), Min:98.5 F (36.9 C), Max:102.8 F (39.3 C)   Recent Labs Lab 05/17/15 2100 05/17/15 2110 05/17/15 2342  WBC 12.5*  --   --   CREATININE 1.23*  --   --   LATICACIDVEN  --  3.50* 2.04*    CrCl cannot be calculated (Unknown ideal weight.).    Allergies  Allergen Reactions  . Ibuprofen Other (See Comments)    Bleeding events    Antimicrobials this admission: 4/10 Rocepin >>   Microbiology results: 4/10 BCx:  4/10 UCx:    Thank you for allowing pharmacy to be a part of this patient's care.  Christoper Fabian, PharmD, BCPS Clinical pharmacist, pager [phone] 05/18/2015 12:32 AM

## 2015-05-18 NOTE — Progress Notes (Signed)
[facility] Progress Note                                                                                                                                                                                                                      Patient Demographics:    Mary Mccarthy, is a 75 y.o. female, DOB - [birth year], VWU:[id number]  Admit date - 05/17/2015   Admitting Physician [name], MD  Outpatient Primary MD for the patient is [name], MD  LOS - 1  Outpatient Specialists: [facility]  Chief Complaint  Patient presents with  . Chest Pain  . Shortness of Breath        Subjective:    [name] today has, No headache, No chest pain, No abdominal pain - No Nausea, No new weakness tingling or numbness, No Cough - SOB.     Assessment  & Plan :     1. Severe symptomatic severe iron deficiency anemia. Likely due to chronic occult GI blood loss, Hemoccult negative in ER, severe iron deficiency on anemia panel. We'll get total 3 units of packed RBC transfusion on 05/18/2015, IV iron followed by oral, since her troponin has peaked at close to 6, I will request cardiology and GI both to evaluate, she most likely will require some antiplatelet regimen upon discharge and it is best that we do GI workup now rather than later. Continue PPI.  2. NSTEMI. Demand ischemia causing troponin rise. EKG nonacute, currently pain-free, beta blocker and statin continued, echo ordered, cardiology consulted, will try to complete GI workup in case she requires to be on antiplatelet medications for the same.  3. Acute renal failure due to anemia. Improved with transfusion monitor.  4. UTI. On Rocephin follow cultures, I do not think she was septic, elevated lactate was due to severe anemia. She is nontoxic. I will renal ultrasound as she ran a high fever posttransfusion which could be  posttransfusion fever as well.  5. Dyslipidemia. On statin.  6. DM type II. On sliding scale.  Lab Results  Component Value Date   HGBA1C 7.1* 11/20/2014   CBG (last 3)   Recent Labs  05/18/15 0058 05/18/15 0841  GLUCAP 154* 121*        Code Status : Full  Family Communication  : None present  Disposition Plan  : Stay inpatient  Barriers For Discharge : GI and cardiology workup  Consults  :  GI, Cards  Procedures  :    Echogram  Renal ultrasound  DVT Prophylaxis  :   SCDs    Lab Results  Component Value Date   PLT 197 05/18/2015    Antibiotics  :    Anti-infectives    Start     Dose/Rate Route Frequency Ordered Stop   05/18/15 2200  cefTRIAXone (ROCEPHIN) 1 g in dextrose 5 % 50 mL IVPB     1 g 100 mL/hr over 30 Minutes Intravenous Every 24 hours 05/18/15 0032     05/18/15 0030  cefTRIAXone (ROCEPHIN) 2 g in dextrose 5 % 50 mL IVPB  Status:  Discontinued     2 g 100 mL/hr over 30 Minutes Intravenous  Once 05/18/15 0020 05/18/15 0032   05/17/15 2315  cefTRIAXone (ROCEPHIN) 1 g in dextrose 5 % 50 mL IVPB     1 g 100 mL/hr over 30 Minutes Intravenous  Once 05/17/15 2311 05/17/15 2358        Objective:   Filed Vitals:   05/18/15 0700 05/18/15 0812 05/18/15 0841 05/18/15 1100  BP: 131/52   110/50  Pulse: 91   65  Temp:   101.1 F (38.4 C) 97.8 F (36.6 C)  TempSrc:   Axillary Oral  Resp: 12   28  Height:      Weight:      SpO2: 99% 100%  100%    Wt Readings from Last 3 Encounters:  05/18/15 104.645 kg (230 lb 11.2 oz)  11/20/14 100.245 kg (221 lb)  05/21/14 101.606 kg (224 lb)     Intake/Output Summary (Last 24 hours) at 05/18/15 1201 Last data filed at 05/18/15 1000  Gross per 24 hour  Intake    720 ml  Output      0 ml  Net    720 ml     Physical Exam  Awake Alert, Oriented X 3, No new F.N deficits, Normal affect [place].AT,PERRAL Supple Neck,No JVD, No cervical lymphadenopathy appriciated.  Symmetrical Chest wall movement,  Good air movement bilaterally, CTAB RRR,No Gallops,Rubs or new Murmurs, No Parasternal Heave +ve B.Sounds, Abd Soft, No tenderness, No organomegaly appriciated, No rebound - guarding or rigidity. No Cyanosis, Clubbing or edema, No new Rash or bruise      Data Review:    CBC  Recent Labs Lab 05/17/15 2100 05/18/15 0748  WBC 12.5* 13.4*  HGB 5.0* 7.0*  HCT 18.8* 23.4*  PLT 233 197  MCV 70.9* 73.4*  MCH 18.9* 21.9*  MCHC 26.6* 29.9*  RDW 17.9* 18.2*  LYMPHSABS 0.5*  --   MONOABS 0.6  --   EOSABS 0.0  --   BASOSABS 0.0  --     Chemistries   Recent Labs Lab 05/17/15 2100 05/18/15 0748  NA 135 136  K 3.6 4.0  CL 103 106  CO2 20* 19*  GLUCOSE 173* 154*  BUN 21* 22*  CREATININE 1.23* 1.16*  CALCIUM 9.2 8.7*  AST 25 46*  ALT 13* 19  ALKPHOS 60 57  BILITOT 1.2 2.9*   ------------------------------------------------------------------------------------------------------------------ No results for input(s): CHOL, HDL, LDLCALC, TRIG, CHOLHDL, LDLDIRECT in the last 72 hours.  Lab Results  Component Value Date   HGBA1C 7.1* 11/20/2014   ------------------------------------------------------------------------------------------------------------------ No results for input(s): TSH, T4TOTAL, T3FREE, THYROIDAB in the last 72 hours.  Invalid input(s): FREET3 ------------------------------------------------------------------------------------------------------------------  Recent Labs  05/17/15 2209 05/17/15 2210  VITAMINB12  --  241  FOLATE 13.9  --   FERRITIN  --  4*  TIBC  --  434  IRON  --  21*  RETICCTPCT  --  1.8    Coagulation profile No results for input(s): INR, PROTIME in the last 168 hours.  No results for input(s): DDIMER in the last 72 hours.  Cardiac Enzymes  Recent Labs Lab 05/18/15 0750  TROPONINI 5.24*   ------------------------------------------------------------------------------------------------------------------    Component Value  Date/Time   BNP 168.3* 05/17/2015 2141    Inpatient Medications  Scheduled Meds: . sodium chloride  10 mL/hr Intravenous Once  . sodium chloride   Intravenous Once  . [START ON 05/19/2015] antiseptic oral rinse  7 mL Mouth Rinse BID  . cefTRIAXone (ROCEPHIN)  IV  1 g Intravenous Q24H  . clonazePAM  0.5-1 mg Oral QHS  . ferrous sulfate  325 mg Oral BID WC  . ferumoxytol  510 mg Intravenous Weekly  . furosemide  20 mg Intravenous Once  . insulin aspart  0-5 Units Subcutaneous QHS  . insulin aspart  0-9 Units Subcutaneous TID WC  . ipratropium  0.5 mg Nebulization Q6H  . metoprolol tartrate  12.5 mg Oral BID  . pantoprazole (PROTONIX) IV  40 mg Intravenous Q12H  . pravastatin  40 mg Oral QPM  . sodium chloride flush  3 mL Intravenous Q12H   Continuous Infusions:  PRN Meds:.acetaminophen **OR** [DISCONTINUED] acetaminophen, albuterol, diphenhydrAMINE, morphine injection, [DISCONTINUED] ondansetron **OR** ondansetron (ZOFRAN) IV  Micro Results Recent Results (from the past 240 hour(s))  MRSA PCR Screening     Status: None   Collection Time: 05/18/15  2:05 AM  Result Value Ref Range Status   MRSA by PCR NEGATIVE NEGATIVE Final    Comment:        The GeneXpert MRSA Assay (FDA approved for NASAL specimens only), is one component of a comprehensive MRSA colonization surveillance program. It is not intended to diagnose MRSA infection nor to guide or monitor treatment for MRSA infections.     Radiology Reports X-ray Chest Pa And Lateral  05/18/2015  CLINICAL DATA:  Sepsis, chest heaviness, shortness of breath EXAM: CHEST  2 VIEW COMPARISON:  05/17/2015 FINDINGS: Cardiomegaly again noted. Central mild vascular congestion without convincing pulmonary edema. No segmental infiltrate. Mild basilar atelectasis. Mild degenerative changes thoracic spine. IMPRESSION: Cardiomegaly. Central vascular congestion without convincing pulmonary edema. Basilar atelectasis. Mild degenerative changes  thoracic spine. Electronically Signed   By: Natasha Mead M.D.   On: 05/18/2015 08:04   Dg Chest Port 1 View  05/17/2015  CLINICAL DATA:  Chest pain and dyspnea.  Duration 1 day. EXAM: PORTABLE CHEST 1 VIEW COMPARISON:  05/20/2013 FINDINGS: A single AP portable view of the chest demonstrates no focal airspace consolidation or alveolar edema. The lungs are grossly clear. There is no large effusion or pneumothorax. Cardiac and mediastinal contours appear unremarkable. IMPRESSION: No active disease. Electronically Signed   By: Ellery Plunk M.D.   On: 05/17/2015 21:17    Time Spent in minutes   30   SINGH,PRASHANT K M.D on 05/18/2015 at 12:01 PM  Between 7am to 7pm - Pager - [phone]  After 7pm go to [email or web address] - password [facility]  [facility] -  Office  [phone]

## 2015-05-19 DIAGNOSIS — A419 Sepsis, unspecified organism: Secondary | ICD-10-CM

## 2015-05-19 DIAGNOSIS — N390 Urinary tract infection, site not specified: Secondary | ICD-10-CM

## 2015-05-19 DIAGNOSIS — I209 Angina pectoris, unspecified: Secondary | ICD-10-CM

## 2015-05-19 DIAGNOSIS — I10 Essential (primary) hypertension: Secondary | ICD-10-CM

## 2015-05-19 DIAGNOSIS — I2489 Other forms of acute ischemic heart disease: Secondary | ICD-10-CM | POA: Diagnosis present

## 2015-05-19 DIAGNOSIS — I248 Other forms of acute ischemic heart disease: Secondary | ICD-10-CM

## 2015-05-19 LAB — CBC WITH DIFFERENTIAL/PLATELET
BASOS PCT: 0 %
Basophils Absolute: 0 10*3/uL (ref 0.0–0.1)
EOS ABS: 0.1 10*3/uL (ref 0.0–0.7)
EOS PCT: 1 %
HEMATOCRIT: 27.6 % — AB (ref 36.0–46.0)
Hemoglobin: 8.5 g/dL — ABNORMAL LOW (ref 12.0–15.0)
Lymphocytes Relative: 11 %
Lymphs Abs: 1 10*3/uL (ref 0.7–4.0)
MCH: 23.2 pg — ABNORMAL LOW (ref 26.0–34.0)
MCHC: 30.8 g/dL (ref 30.0–36.0)
MCV: 75.2 fL — ABNORMAL LOW (ref 78.0–100.0)
MONO ABS: 1 10*3/uL (ref 0.1–1.0)
MONOS PCT: 10 %
NEUTROS ABS: 7.4 10*3/uL (ref 1.7–7.7)
Neutrophils Relative %: 78 %
Platelets: 187 10*3/uL (ref 150–400)
RBC: 3.67 MIL/uL — ABNORMAL LOW (ref 3.87–5.11)
RDW: 18.2 % — AB (ref 11.5–15.5)
WBC: 9.5 10*3/uL (ref 4.0–10.5)

## 2015-05-19 LAB — BASIC METABOLIC PANEL
ANION GAP: 11 (ref 5–15)
BUN: 25 mg/dL — ABNORMAL HIGH (ref 6–20)
CALCIUM: 8.8 mg/dL — AB (ref 8.9–10.3)
CO2: 21 mmol/L — ABNORMAL LOW (ref 22–32)
Chloride: 100 mmol/L — ABNORMAL LOW (ref 101–111)
Creatinine, Ser: 1.19 mg/dL — ABNORMAL HIGH (ref 0.44–1.00)
GFR, EST AFRICAN AMERICAN: 50 mL/min — AB (ref >60)
GFR, EST NON AFRICAN AMERICAN: 43 mL/min — AB (ref >60)
Glucose, Bld: 112 mg/dL — ABNORMAL HIGH (ref 65–99)
POTASSIUM: 3.6 mmol/L (ref 3.5–5.1)
SODIUM: 132 mmol/L — AB (ref 135–145)

## 2015-05-19 LAB — CBC
HCT: 25.6 % — ABNORMAL LOW (ref 36.0–46.0)
Hemoglobin: 7.6 g/dL — ABNORMAL LOW (ref 12.0–15.0)
MCH: 21.9 pg — ABNORMAL LOW (ref 26.0–34.0)
MCHC: 29.7 g/dL — ABNORMAL LOW (ref 30.0–36.0)
MCV: 73.8 fL — ABNORMAL LOW (ref 78.0–100.0)
PLATELETS: 178 10*3/uL (ref 150–400)
RBC: 3.47 MIL/uL — AB (ref 3.87–5.11)
RDW: 18.4 % — ABNORMAL HIGH (ref 11.5–15.5)
WBC: 11.8 10*3/uL — AB (ref 4.0–10.5)

## 2015-05-19 LAB — GLUCOSE, CAPILLARY
GLUCOSE-CAPILLARY: 109 mg/dL — AB (ref 65–99)
GLUCOSE-CAPILLARY: 121 mg/dL — AB (ref 65–99)
GLUCOSE-CAPILLARY: 163 mg/dL — AB (ref 65–99)
GLUCOSE-CAPILLARY: 131 mg/dL — AB (ref 65–99)
Glucose-Capillary: 280 mg/dL — ABNORMAL HIGH (ref 65–99)

## 2015-05-19 LAB — HAPTOGLOBIN: HAPTOGLOBIN: 170 mg/dL (ref 34–200)

## 2015-05-19 LAB — PREPARE RBC (CROSSMATCH)

## 2015-05-19 MED ORDER — BISACODYL 5 MG PO TBEC
10.0000 mg | DELAYED_RELEASE_TABLET | Freq: Once | ORAL | Status: AC
  Administered 2015-05-19: 10 mg via ORAL
  Filled 2015-05-19: qty 2

## 2015-05-19 MED ORDER — CLONAZEPAM 0.5 MG PO TABS
0.5000 mg | ORAL_TABLET | Freq: Every day | ORAL | Status: DC
  Administered 2015-05-19 – 2015-05-21 (×3): 0.5 mg via ORAL
  Filled 2015-05-19 (×3): qty 1

## 2015-05-19 MED ORDER — FUROSEMIDE 10 MG/ML IJ SOLN
20.0000 mg | Freq: Once | INTRAMUSCULAR | Status: AC
  Administered 2015-05-19: 20 mg via INTRAVENOUS
  Filled 2015-05-19: qty 2

## 2015-05-19 MED ORDER — PEG-KCL-NACL-NASULF-NA ASC-C 100 G PO SOLR
0.5000 | Freq: Once | ORAL | Status: AC
  Administered 2015-05-20: 100 g via ORAL
  Filled 2015-05-19: qty 1

## 2015-05-19 MED ORDER — SODIUM CHLORIDE 0.9 % IV SOLN: Freq: Once | INTRAVENOUS | Status: DC

## 2015-05-19 MED ORDER — ATORVASTATIN CALCIUM 80 MG PO TABS
80.0000 mg | ORAL_TABLET | Freq: Every day | ORAL | Status: DC
  Administered 2015-05-19 – 2015-05-22 (×4): 80 mg via ORAL
  Filled 2015-05-19 (×4): qty 1

## 2015-05-19 MED ORDER — PEG-KCL-NACL-NASULF-NA ASC-C 100 G PO SOLR
0.5000 | Freq: Once | ORAL | Status: AC
  Administered 2015-05-19: 100 g via ORAL
  Filled 2015-05-19: qty 1

## 2015-05-19 MED ORDER — PEG-KCL-NACL-NASULF-NA ASC-C 100 G PO SOLR: 1.0000 | Freq: Once | ORAL | Status: DC

## 2015-05-19 NOTE — Progress Notes (Signed)
Started on moviprep, instructed to finish the whole container 1L within 2 hours and to call for any problem.

## 2015-05-19 NOTE — Progress Notes (Signed)
Pt called out SOB and feeling hot. RN repositioned pt and adjusted temp in room. Oral temp was 98.5. Resp called to assess and give PRN neb. Breath sounds clear bilaterally, SpO2 98%. Pt stated breathing was better but still felt hot. Cool washcloths placed on pt per pt request. Will continue to monitor

## 2015-05-19 NOTE — Progress Notes (Signed)
[facility] Progress Note                                                                                                                                                                                                                      Patient Demographics:    Mary Mccarthy, is a 75 y.o. female, DOB - 1941-01-14, WFU:[id number]  Admit date - 05/17/2015   Admitting Physician Norval Morton, MD  Outpatient Primary MD for the patient is Cathlean Cower, MD  LOS - 2  Outpatient Specialists: [facility]  Chief Complaint  Patient presents with  . Chest Pain  . Shortness of Breath        Subjective:    Mary Mccarthy today has, No headache, No chest pain, No abdominal pain - No Nausea, No new weakness tingling or numbness, No Cough - SOB. Feels little cold but no chills.     Assessment  & Plan :     1. Severe symptomatic severe iron deficiency anemia. Likely due to chronic occult GI blood loss, Hemoccult negative in ER, severe iron deficiency on anemia panel. She received 3 units of packed RBC transfusion on 05/18/2015 and will get another unit of packed RBC on 05/19/2015 to keep hemoglobin around 8 in the setting of non-STEMI, received IV iron on 05/18/2015, stable on PPI GI following contemplating EGD/colonoscopy 05/20/2015.  2. NSTEMI with moderate AS. Demand ischemia causing troponin rise. EKG nonacute, currently pain-free, beta blocker and statin continued, echo noted with EF of 55-60% without any wall motion abnormality but with moderate AS, cardiology consulted, further testing and management per cardiology. Completing GI workup to provide room for left heart cath and antiplatelet medications if needed.  3. Acute renal failure due to anemia. Resolved after  packed RBC transfusion.  4. UTI. On Rocephin follow cultures, I  do not think she was septic, elevated lactate was due to severe anemia. She is nontoxic. Nonacute renal ultrasound with no signs of pyelonephritis or obstruction.    5. Dyslipidemia. On statin.  6. DM type II. On sliding scale.  Lab Results  Component Value Date   HGBA1C 7.1* 11/20/2014   CBG (last 3)   Recent Labs  05/18/15 1625 05/18/15 2117 05/19/15 0736  GLUCAP 163* 109* 121*     Code Status : Full  Family Communication  : None present  Disposition Plan  : Stay inpatient  Barriers For Discharge : GI and cardiology workup  Consults  :  GI, Cards  Procedures  :    Echogram - EF 60%, Compared to a prior study in 2015, there is now moderate aortic stenosis. AVA around 1.1-1.2 cm2. There is Stage 2 DD with elevated LV filling pressure, moderate TR, RVSP 51 mmHg with a dilated IVC.  Renal ultrasound - Non acute  DVT Prophylaxis  :   SCDs    Lab Results  Component Value Date   PLT 178 05/19/2015    Antibiotics  :    Anti-infectives    Start     Dose/Rate Route Frequency Ordered Stop   05/18/15 2200  cefTRIAXone (ROCEPHIN) 1 g in dextrose 5 % 50 mL IVPB     1 g 100 mL/hr over 30 Minutes Intravenous Every 24 hours 05/18/15 0032     05/18/15 0030  cefTRIAXone (ROCEPHIN) 2 g in dextrose 5 % 50 mL IVPB  Status:  Discontinued     2 g 100 mL/hr over 30 Minutes Intravenous  Once 05/18/15 0020 05/18/15 0032   05/17/15 2315  cefTRIAXone (ROCEPHIN) 1 g in dextrose 5 % 50 mL IVPB     1 g 100 mL/hr over 30 Minutes Intravenous  Once 05/17/15 2311 05/17/15 2358        Objective:   Filed Vitals:   05/19/15 0400 05/19/15 0500 05/19/15 0827 05/19/15 0853  BP: 95/61 94/75 120/54 128/56  Pulse: 78 78 71 78  Temp:  98.6 F (37 C) 98.3 F (36.8 C) 98.8 F (37.1 C)  TempSrc:  Oral Oral Oral  Resp: '27 19 25 23  '$ Height:      Weight:      SpO2: 99% 100% 99% 99%    Wt Readings from Last 3 Encounters:    05/18/15 104.645 kg (230 lb 11.2 oz)  11/20/14 100.245 kg (221 lb)  05/21/14 101.606 kg (224 lb)     Intake/Output Summary (Last 24 hours) at 05/19/15 0920 Last data filed at 05/19/15 0900  Gross per 24 hour  Intake   1135 ml  Output    100 ml  Net   1035 ml     Physical Exam  Awake Alert, Oriented X 3, No new F.N deficits, Normal affect [place].AT,PERRAL Supple Neck,No JVD, No cervical lymphadenopathy appriciated.  Symmetrical Chest wall movement, Good air movement bilaterally, CTAB RRR,No Gallops,Rubs or new Murmurs, No Parasternal Heave +ve B.Sounds, Abd Soft, No tenderness, No organomegaly appriciated, No rebound - guarding or rigidity. No Cyanosis, Clubbing or edema, No new Rash or bruise      Data Review:    CBC  Recent Labs Lab 05/17/15 2100 05/18/15 0748 05/18/15 1951 05/18/15 2335 05/19/15 0517  WBC 12.5* 13.4* 12.5*  --  11.8*  HGB 5.0* 7.0* 7.4*  7.5* 7.3* 7.6*  HCT 18.8* 23.4* 25.3*  25.6* 25.0* 25.6*  PLT 233 197 195  --  178  MCV 70.9* 73.4* 74.2*  --  73.8*  MCH 18.9* 21.9* 21.7*  --  21.9*  MCHC 26.6* 29.9* 29.2*  --  29.7*  RDW 17.9* 18.2* 18.1*  --  18.4*  LYMPHSABS 0.5*  --   --   --   --   MONOABS 0.6  --   --   --   --   EOSABS 0.0  --   --   --   --   BASOSABS 0.0  --   --   --   --     Chemistries   Recent Labs Lab 05/17/15 2100 05/18/15 0748 05/19/15 0254  NA 135 136 132*  K 3.6 4.0 3.6  CL 103 106 100*  CO2 20* 19* 21*  GLUCOSE 173* 154* 112*  BUN 21* 22* 25*  CREATININE 1.23* 1.16* 1.19*  CALCIUM 9.2 8.7* 8.8*  AST 25 46*  --   ALT 13* 19  --   ALKPHOS 60 57  --   BILITOT 1.2 2.9*  --    ------------------------------------------------------------------------------------------------------------------ No results for input(s): CHOL, HDL, LDLCALC, TRIG, CHOLHDL, LDLDIRECT in the last 72 hours.  Lab Results  Component Value Date   HGBA1C 7.1* 11/20/2014    ------------------------------------------------------------------------------------------------------------------ No results for input(s): TSH, T4TOTAL, T3FREE, THYROIDAB in the last 72 hours.  Invalid input(s): FREET3 ------------------------------------------------------------------------------------------------------------------  Recent Labs  05/17/15 2209 05/17/15 2210  VITAMINB12  --  241  FOLATE 13.9  --   FERRITIN  --  4*  TIBC  --  434  IRON  --  21*  RETICCTPCT  --  1.8    Coagulation profile No results for input(s): INR, PROTIME in the last 168 hours.  No results for input(s): DDIMER in the last 72 hours.  Cardiac Enzymes  Recent Labs Lab 05/18/15 0750 05/18/15 1951  TROPONINI 5.24* 6.98*   ------------------------------------------------------------------------------------------------------------------    Component Value Date/Time   BNP 168.3* 05/17/2015 2141    Inpatient Medications  Scheduled Meds: . sodium chloride  10 mL/hr Intravenous Once  . antiseptic oral rinse  7 mL Mouth Rinse BID  . bisacodyl  10 mg Oral Once  . cefTRIAXone (ROCEPHIN)  IV  1 g Intravenous Q24H  . clonazePAM  0.5 mg Oral QHS  . ferumoxytol  510 mg Intravenous Weekly  . furosemide  20 mg Intravenous Once  . insulin aspart  0-5 Units Subcutaneous QHS  . insulin aspart  0-9 Units Subcutaneous TID WC  . metoprolol tartrate  12.5 mg Oral BID  . pantoprazole  40 mg Oral Q0600  . peg 3350 powder  0.5 kit Oral Once   And  . [START ON 05/20/2015] peg 3350 powder  0.5 kit Oral Once  . pravastatin  40 mg Oral QPM  . sodium chloride flush  3 mL Intravenous Q12H   Continuous Infusions:  PRN Meds:.acetaminophen **OR** [DISCONTINUED] acetaminophen, albuterol, diphenhydrAMINE, morphine injection, [DISCONTINUED] ondansetron **OR** ondansetron (ZOFRAN) IV  Micro Results Recent Results (from the past 240 hour(s))  Blood Culture (routine x 2)     Status: None (Preliminary result)    Collection Time: 05/17/15  8:55 PM  Result Value Ref Range Status   Specimen Description BLOOD LEFT ANTECUBITAL  Final   Special Requests IN PEDIATRIC BOTTLE 2CC  Final   Culture NO GROWTH < 24 HOURS  Final   Report Status PENDING  Incomplete  Blood Culture (routine x 2)     Status: None (Preliminary result)   Collection Time: 05/17/15  9:00 PM  Result  Value Ref Range Status   Specimen Description BLOOD RIGHT FOREARM  Final   Special Requests BOTTLES DRAWN AEROBIC AND ANAEROBIC 5CC  Final   Culture NO GROWTH < 24 HOURS  Final   Report Status PENDING  Incomplete  MRSA PCR Screening     Status: None   Collection Time: 05/18/15  2:05 AM  Result Value Ref Range Status   MRSA by PCR NEGATIVE NEGATIVE Final    Comment:        The GeneXpert MRSA Assay (FDA approved for NASAL specimens only), is one component of a comprehensive MRSA colonization surveillance program. It is not intended to diagnose MRSA infection nor to guide or monitor treatment for MRSA infections.     Radiology Reports X-ray Chest Pa And Lateral  05/18/2015  CLINICAL DATA:  Sepsis, chest heaviness, shortness of breath EXAM: CHEST  2 VIEW COMPARISON:  05/17/2015 FINDINGS: Cardiomegaly again noted. Central mild vascular congestion without convincing pulmonary edema. No segmental infiltrate. Mild basilar atelectasis. Mild degenerative changes thoracic spine. IMPRESSION: Cardiomegaly. Central vascular congestion without convincing pulmonary edema. Basilar atelectasis. Mild degenerative changes thoracic spine. Electronically Signed   By: Lahoma Crocker M.D.   On: 05/18/2015 08:04   US Renal  05/18/2015  CLINICAL DATA:  Acute renal failure EXAM: RENAL / URINARY TRACT ULTRASOUND COMPLETE COMPARISON:  None. FINDINGS: Right Kidney: Length: 10.6 cm. Echogenicity within normal limits. There is renal cortical thinning. No mass, perinephric fluid, or hydronephrosis visualized. No sonographically demonstrable calculus or ureterectasis.  Left Kidney: Length: 11.5 cm. Echogenicity within normal limits. There is renal cortical thinning. No mass, perinephric fluid, or hydronephrosis visualized. No sonographically demonstrable calculus or ureterectasis. Bladder: Appears normal for degree of bladder distention. IMPRESSION: Renal cortical thinning, a finding that may be associated with medical renal disease. The renal echogenicity, however, is normal. Study otherwise unremarkable. In particular, no obstructing foci identified on either side. Electronically Signed   By: Lowella Grip III M.D.   On: 05/18/2015 16:31   Dg Chest Port 1 View  05/17/2015  CLINICAL DATA:  Chest pain and dyspnea.  Duration 1 day. EXAM: PORTABLE CHEST 1 VIEW COMPARISON:  05/20/2013 FINDINGS: A single AP portable view of the chest demonstrates no focal airspace consolidation or alveolar edema. The lungs are grossly clear. There is no large effusion or pneumothorax. Cardiac and mediastinal contours appear unremarkable. IMPRESSION: No active disease. Electronically Signed   By: Andreas Newport M.D.   On: 05/17/2015 21:17    Time Spent in minutes   30   Jeric Slagel K M.D on 05/19/2015 at 9:20 AM  Between 7am to 7pm - Pager - [phone]  After 7pm go to [email or web address] - password [email or web address]  [facility] -  Office  [phone]

## 2015-05-19 NOTE — Progress Notes (Signed)
DAILY PROGRESS NOTE  Subjective:  Frustrated. Receiving additional blood today. GI plans for scope tomorrow. Echo yesterday shows preserved systolic function, Grade 2 DD with elevated LV filling pressure, pulmonary hypertension and now moderate AS.  Objective:  Temp:  [97.6 F (36.4 C)-100.4 F (38 C)] 98.8 F (37.1 C) (04/12 0853) Pulse Rate:  [59-82] 78 (04/12 0853) Resp:  [12-39] 23 (04/12 0853) BP: (91-170)/(46-113) 128/56 mmHg (04/12 0853) SpO2:  [93 %-100 %] 99 % (04/12 0853) Weight change:   Intake/Output from previous day: 04/11 0701 - 04/12 0700 In: 905 [P.O.:470; Blood:335; IV Piggyback:100] Out: 100 [Urine:100]  Intake/Output from this shift: Total I/O In: 230 [P.O.:200; Blood:30] Out: -   Medications: Current Facility-Administered Medications  Medication Dose Route Frequency Provider Last Rate Last Dose  . 0.9 %  sodium chloride infusion  10 mL/hr Intravenous Once Leo Grosser, MD   10 mL/hr at 05/18/15 0101  . acetaminophen (TYLENOL) tablet 650 mg  650 mg Oral Q6H PRN Norval Morton, MD   650 mg at 05/19/15 2706  . albuterol (PROVENTIL) (2.5 MG/3ML) 0.083% nebulizer solution 2.5 mg  2.5 mg Nebulization Q2H PRN Norval Morton, MD   2.5 mg at 05/19/15 0011  . antiseptic oral rinse (CPC / CETYLPYRIDINIUM CHLORIDE 0.05%) solution 7 mL  7 mL Mouth Rinse BID Rondell Charmayne Sheer, MD   7 mL at 05/19/15 1000  . bisacodyl (DULCOLAX) EC tablet 10 mg  10 mg Oral Once Vena Rua, PA-C      . cefTRIAXone (ROCEPHIN) 1 g in dextrose 5 % 50 mL IVPB  1 g Intravenous Q24H Franky Macho, RPH   1 g at 05/18/15 2242  . clonazePAM (KLONOPIN) tablet 0.5 mg  0.5 mg Oral QHS Thurnell Lose, MD      . diphenhydrAMINE (BENADRYL) injection 25 mg  25 mg Intravenous Q6H PRN Thurnell Lose, MD      . ferumoxytol [facility]) 510 mg in sodium chloride 0.9 % 100 mL IVPB  510 mg Intravenous Weekly Thurnell Lose, MD   510 mg at 05/18/15 1209  . furosemide (LASIX) injection 20 mg  20 mg  Intravenous Once Thurnell Lose, MD      . insulin aspart (novoLOG) injection 0-5 Units  0-5 Units Subcutaneous QHS Norval Morton, MD   0 Units at 05/18/15 0059  . insulin aspart (novoLOG) injection 0-9 Units  0-9 Units Subcutaneous TID WC Norval Morton, MD   1 Units at 05/19/15 0900  . metoprolol tartrate (LOPRESSOR) tablet 12.5 mg  12.5 mg Oral BID Thurnell Lose, MD   12.5 mg at 05/19/15 0901  . morphine 2 MG/ML injection 2 mg  2 mg Intravenous Q2H PRN Norval Morton, MD      . ondansetron (ZOFRAN) injection 4 mg  4 mg Intravenous Q6H PRN Norval Morton, MD      . pantoprazole (PROTONIX) EC tablet 40 mg  40 mg Oral Q0600 Vena Rua, PA-C   40 mg at 05/19/15 0640  . peg 3350 powder (MOVIPREP) kit 100 g  0.5 kit Oral Once Vena Rua, PA-C       And  . Derrill Memo ON 05/20/2015] peg 3350 powder (MOVIPREP) kit 100 g  0.5 kit Oral Once Vena Rua, PA-C      . pravastatin (PRAVACHOL) tablet 40 mg  40 mg Oral QPM Norval Morton, MD   40 mg at 05/18/15 1740  . sodium chloride flush (  NS) 0.9 % injection 3 mL  3 mL Intravenous Q12H Norval Morton, MD   3 mL at 05/19/15 2536    Physical Exam: General appearance: alert and no distress Neck: no carotid bruit and no JVD Lungs: clear to auscultation bilaterally Heart: regular rate and rhythm, S1, S2 normal and systolic murmur: midsystolic 3/6, crescendo at 2nd left intercostal space Abdomen: soft, non-tender; bowel sounds normal; no masses,  no organomegaly and obese Extremities: extremities normal, atraumatic, no cyanosis or edema Pulses: 2+ and symmetric Skin: Skin color, texture, turgor normal. No rashes or lesions Neurologic: Grossly normal Psych: Frustruated  Lab Results: Results for orders placed or performed during the hospital encounter of 05/17/15 (from the past 48 hour(s))  Blood Culture (routine x 2)     Status: None (Preliminary result)   Collection Time: 05/17/15  8:55 PM  Result Value Ref Range   Specimen  Description BLOOD LEFT ANTECUBITAL    Special Requests IN PEDIATRIC BOTTLE 2CC    Culture NO GROWTH < 24 HOURS    Report Status PENDING   Comprehensive metabolic panel     Status: Abnormal   Collection Time: 05/17/15  9:00 PM  Result Value Ref Range   Sodium 135 135 - 145 mmol/L   Potassium 3.6 3.5 - 5.1 mmol/L   Chloride 103 101 - 111 mmol/L   CO2 20 (L) 22 - 32 mmol/L   Glucose, Bld 173 (H) 65 - 99 mg/dL   BUN 21 (H) 6 - 20 mg/dL   Creatinine, Ser 1.23 (H) 0.44 - 1.00 mg/dL   Calcium 9.2 8.9 - 10.3 mg/dL   Total Protein 6.3 (L) 6.5 - 8.1 g/dL   Albumin 3.1 (L) 3.5 - 5.0 g/dL   AST 25 15 - 41 U/L   ALT 13 (L) 14 - 54 U/L   Alkaline Phosphatase 60 38 - 126 U/L   Total Bilirubin 1.2 0.3 - 1.2 mg/dL   GFR calc non Af Amer 42 (L) >60 mL/min   GFR calc Af Amer 48 (L) >60 mL/min    Comment: (NOTE) The eGFR has been calculated using the CKD EPI equation. This calculation has not been validated in all clinical situations. eGFR's persistently <60 mL/min signify possible Chronic Kidney Disease.    Anion gap 12 5 - 15  CBC WITH DIFFERENTIAL     Status: Abnormal   Collection Time: 05/17/15  9:00 PM  Result Value Ref Range   WBC 12.5 (H) 4.0 - 10.5 K/uL   RBC 2.65 (L) 3.87 - 5.11 MIL/uL   Hemoglobin 5.0 (LL) 12.0 - 15.0 g/dL    Comment: REPEATED TO VERIFY CRITICAL RESULT CALLED TO, READ BACK BY AND VERIFIED WITH: MONTAGUE,G RN _0  BY GRINSTEAD,C 4.10.17    HCT 18.8 (L) 36.0 - 46.0 %   MCV 70.9 (L) 78.0 - 100.0 fL   MCH 18.9 (L) 26.0 - 34.0 pg   MCHC 26.6 (L) 30.0 - 36.0 g/dL   RDW 17.9 (H) 11.5 - 15.5 %   Platelets 233 150 - 400 K/uL   Neutrophils Relative % 91 %   Lymphocytes Relative 4 %   Monocytes Relative 5 %   Eosinophils Relative 0 %   Basophils Relative 0 %   Neutro Abs 11.4 (H) 1.7 - 7.7 K/uL   Lymphs Abs 0.5 (L) 0.7 - 4.0 K/uL   Monocytes Absolute 0.6 0.1 - 1.0 K/uL   Eosinophils Absolute 0.0 0.0 - 0.7 K/uL   Basophils Absolute 0.0 0.0 - 0.1 K/uL  RBC  Morphology POLYCHROMASIA PRESENT   Blood Culture (routine x 2)     Status: None (Preliminary result)   Collection Time: 05/17/15  9:00 PM  Result Value Ref Range   Specimen Description BLOOD RIGHT FOREARM    Special Requests BOTTLES DRAWN AEROBIC AND ANAEROBIC 5CC    Culture NO GROWTH < 24 HOURS    Report Status PENDING   I-stat troponin, ED (not at [facility])     Status: Abnormal   Collection Time: 05/17/15  9:08 PM  Result Value Ref Range   Troponin i, poc 0.37 (HH) 0.00 - 0.08 ng/mL   Comment NOTIFIED PHYSICIAN    Comment 3            Comment: Due to the release kinetics of cTnI, a negative result within the first hours of the onset of symptoms does not rule out myocardial infarction with certainty. If myocardial infarction is still suspected, repeat the test at appropriate intervals.   I-Stat CG4 Lactic Acid, ED  (not at  [facility])     Status: Abnormal   Collection Time: 05/17/15  9:10 PM  Result Value Ref Range   Lactic Acid, Venous 3.50 (HH) 0.5 - 2.0 mmol/L   Comment NOTIFIED PHYSICIAN   Brain natriuretic peptide     Status: Abnormal   Collection Time: 05/17/15  9:41 PM  Result Value Ref Range   B Natriuretic Peptide 168.3 (H) 0.0 - 100.0 pg/mL  Type and screen [place]     Status: None (Preliminary result)   Collection Time: 05/17/15  9:52 PM  Result Value Ref Range   ABO/RH(D) A POS    Antibody Screen NEG    Sample Expiration 05/20/2015    Unit Number [id number]    Blood Component Type RED CELLS,LR    Unit division 00    Status of Unit ISSUED,FINAL    Transfusion Status OK TO TRANSFUSE    Crossmatch Result Compatible    Unit Number [id number]    Blood Component Type RED CELLS,LR    Unit division 00    Status of Unit ISSUED,FINAL    Transfusion Status OK TO TRANSFUSE    Crossmatch Result Compatible    Unit Number [id number]    Blood Component Type RED CELLS,LR    Unit division 00    Status of Unit ISSUED,FINAL    Transfusion Status  OK TO TRANSFUSE    Crossmatch Result Compatible    Unit Number [id number]    Blood Component Type RED CELLS,LR    Unit division 00    Status of Unit ISSUED    Transfusion Status OK TO TRANSFUSE    Crossmatch Result Compatible   Prepare RBC     Status: None   Collection Time: 05/17/15  9:52 PM  Result Value Ref Range   Order Confirmation ORDER PROCESSED BY BLOOD BANK   Influenza panel by PCR (type A & B, H1N1)     Status: None   Collection Time: 05/17/15  9:56 PM  Result Value Ref Range   Influenza A By PCR NEGATIVE NEGATIVE   Influenza B By PCR NEGATIVE NEGATIVE   H1N1 flu by pcr NOT DETECTED NOT DETECTED    Comment:        The Xpert Flu assay (FDA approved for nasal aspirates or washes and nasopharyngeal swab specimens), is intended as an aid in the diagnosis of influenza and should not be used as a sole basis for treatment.   ABO/Rh  Status: None   Collection Time: 05/17/15 10:00 PM  Result Value Ref Range   ABO/RH(D) A POS   Folate     Status: None   Collection Time: 05/17/15 10:09 PM  Result Value Ref Range   Folate 13.9 >5.9 ng/mL  Vitamin B12     Status: None   Collection Time: 05/17/15 10:10 PM  Result Value Ref Range   Vitamin B-12 241 180 - 914 pg/mL    Comment: (NOTE) This assay is not validated for testing neonatal or myeloproliferative syndrome specimens for Vitamin B12 levels.   Iron and TIBC     Status: Abnormal   Collection Time: 05/17/15 10:10 PM  Result Value Ref Range   Iron 21 (L) 28 - 170 ug/dL   TIBC 434 250 - 450 ug/dL   Saturation Ratios 5 (L) 10.4 - 31.8 %   UIBC 413 ug/dL  Ferritin     Status: Abnormal   Collection Time: 05/17/15 10:10 PM  Result Value Ref Range   Ferritin 4 (L) 11 - 307 ng/mL  Reticulocytes     Status: Abnormal   Collection Time: 05/17/15 10:10 PM  Result Value Ref Range   Retic Ct Pct 1.8 0.4 - 3.1 %   RBC. 2.34 (L) 3.87 - 5.11 MIL/uL   Retic Count, Manual 42.1 19.0 - 186.0 K/uL  Urinalysis, Routine w reflex  microscopic (not at [facility])     Status: Abnormal   Collection Time: 05/17/15 10:22 PM  Result Value Ref Range   Color, Urine YELLOW YELLOW   APPearance TURBID (A) CLEAR   Specific Gravity, Urine 1.015 1.005 - 1.030   pH 5.0 5.0 - 8.0   Glucose, UA 100 (A) NEGATIVE mg/dL   Hgb urine dipstick MODERATE (A) NEGATIVE   Bilirubin Urine NEGATIVE NEGATIVE   Ketones, ur 15 (A) NEGATIVE mg/dL   Protein, ur 100 (A) NEGATIVE mg/dL   Nitrite POSITIVE (A) NEGATIVE   Leukocytes, UA LARGE (A) NEGATIVE  Urine microscopic-add on     Status: Abnormal   Collection Time: 05/17/15 10:22 PM  Result Value Ref Range   Squamous Epithelial / LPF 6-30 (A) NONE SEEN   WBC, UA TOO NUMEROUS TO COUNT 0 - 5 WBC/hpf   RBC / HPF 6-30 0 - 5 RBC/hpf   Bacteria, UA MANY (A) NONE SEEN   Urine-Other MUCOUS PRESENT   I-Stat CG4 Lactic Acid, ED  (not at  [facility])     Status: Abnormal   Collection Time: 05/17/15 11:42 PM  Result Value Ref Range   Lactic Acid, Venous 2.04 (HH) 0.5 - 2.0 mmol/L   Comment NOTIFIED PHYSICIAN   POC occult blood, ED RN will collect     Status: None   Collection Time: 05/17/15 11:49 PM  Result Value Ref Range   Fecal Occult Bld NEGATIVE NEGATIVE  Glucose, capillary     Status: Abnormal   Collection Time: 05/18/15 12:58 AM  Result Value Ref Range   Glucose-Capillary 154 (H) 65 - 99 mg/dL  MRSA PCR Screening     Status: None   Collection Time: 05/18/15  2:05 AM  Result Value Ref Range   MRSA by PCR NEGATIVE NEGATIVE    Comment:        The GeneXpert MRSA Assay (FDA approved for NASAL specimens only), is one component of a comprehensive MRSA colonization surveillance program. It is not intended to diagnose MRSA infection nor to guide or monitor treatment for MRSA infections.   CBC     Status:  Abnormal   Collection Time: 05/18/15  7:48 AM  Result Value Ref Range   WBC 13.4 (H) 4.0 - 10.5 K/uL   RBC 3.19 (L) 3.87 - 5.11 MIL/uL   Hemoglobin 7.0 (L) 12.0 - 15.0 g/dL    Comment: REPEATED TO  VERIFY POST TRANSFUSION SPECIMEN    HCT 23.4 (L) 36.0 - 46.0 %   MCV 73.4 (L) 78.0 - 100.0 fL   MCH 21.9 (L) 26.0 - 34.0 pg   MCHC 29.9 (L) 30.0 - 36.0 g/dL   RDW 18.2 (H) 11.5 - 15.5 %   Platelets 197 150 - 400 K/uL  Comprehensive metabolic panel     Status: Abnormal   Collection Time: 05/18/15  7:48 AM  Result Value Ref Range   Sodium 136 135 - 145 mmol/L   Potassium 4.0 3.5 - 5.1 mmol/L   Chloride 106 101 - 111 mmol/L   CO2 19 (L) 22 - 32 mmol/L   Glucose, Bld 154 (H) 65 - 99 mg/dL   BUN 22 (H) 6 - 20 mg/dL   Creatinine, Ser 1.16 (H) 0.44 - 1.00 mg/dL   Calcium 8.7 (L) 8.9 - 10.3 mg/dL   Total Protein 6.3 (L) 6.5 - 8.1 g/dL   Albumin 2.9 (L) 3.5 - 5.0 g/dL   AST 46 (H) 15 - 41 U/L   ALT 19 14 - 54 U/L   Alkaline Phosphatase 57 38 - 126 U/L   Total Bilirubin 2.9 (H) 0.3 - 1.2 mg/dL   GFR calc non Af Amer 45 (L) >60 mL/min   GFR calc Af Amer 52 (L) >60 mL/min    Comment: (NOTE) The eGFR has been calculated using the CKD EPI equation. This calculation has not been validated in all clinical situations. eGFR's persistently <60 mL/min signify possible Chronic Kidney Disease.    Anion gap 11 5 - 15  Procalcitonin     Status: None   Collection Time: 05/18/15  7:48 AM  Result Value Ref Range   Procalcitonin 8.10 ng/mL    Comment:        Interpretation: PCT > 2 ng/mL: Systemic infection (sepsis) is likely, unless other causes are known. (NOTE)         ICU PCT Algorithm               Non ICU PCT Algorithm    ----------------------------     ------------------------------         PCT < 0.25 ng/mL                 PCT < 0.1 ng/mL     Stopping of antibiotics            Stopping of antibiotics       strongly encouraged.               strongly encouraged.    ----------------------------     ------------------------------       PCT level decrease by               PCT < 0.25 ng/mL       >= 80% from peak PCT       OR PCT 0.25 - 0.5 ng/mL          Stopping of antibiotics  encouraged.     Stopping of antibiotics           encouraged.    ----------------------------     ------------------------------       PCT level decrease by              PCT >= 0.25 ng/mL       < 80% from peak PCT        AND PCT >= 0.5 ng/mL            Continuing antibiotics                                               encouraged.       Continuing antibiotics            encouraged.    ----------------------------     ------------------------------     PCT level increase compared          PCT > 0.5 ng/mL         with peak PCT AND          PCT >= 0.5 ng/mL             Escalation of antibiotics                                          strongly encouraged.      Escalation of antibiotics        strongly encouraged.   Troponin I (q 6hr x 3)     Status: Abnormal   Collection Time: 05/18/15  7:50 AM  Result Value Ref Range   Troponin I 5.24 (HH) <0.031 ng/mL    Comment:        POSSIBLE MYOCARDIAL ISCHEMIA. SERIAL TESTING RECOMMENDED. CRITICAL RESULT CALLED TO, READ BACK BY AND VERIFIED WITH: C.DECENA,RN 05/18/15 0834 BY BSLADE   Lactate dehydrogenase     Status: None   Collection Time: 05/18/15  8:35 AM  Result Value Ref Range   LDH 186 98 - 192 U/L  Haptoglobin     Status: None   Collection Time: 05/18/15  8:35 AM  Result Value Ref Range   Haptoglobin 170 34 - 200 mg/dL    Comment: (NOTE) Performed At: [facility] [address] [place], [place] [id number] Lindon Romp MD SA:[id number]   Glucose, capillary     Status: Abnormal   Collection Time: 05/18/15  8:41 AM  Result Value Ref Range   Glucose-Capillary 121 (H) 65 - 99 mg/dL   Comment 1 Capillary Specimen   Glucose, capillary     Status: Abnormal   Collection Time: 05/18/15 11:48 AM  Result Value Ref Range   Glucose-Capillary 122 (H) 65 - 99 mg/dL   Comment 1 Capillary Specimen   Prepare RBC     Status: None   Collection Time: 05/18/15 12:00 PM  Result Value Ref Range     Order Confirmation ORDER PROCESSED BY BLOOD BANK   Glucose, capillary     Status: Abnormal   Collection Time: 05/18/15  4:25 PM  Result Value Ref Range   Glucose-Capillary 163 (H) 65 - 99 mg/dL   Comment 1 Capillary Specimen   Hemoglobin and hematocrit, blood     Status: Abnormal   Collection Time: 05/18/15  7:51 PM  Result Value Ref Range   Hemoglobin 7.5 (L) 12.0 - 15.0 g/dL   HCT 25.6 (L) 36.0 - 46.0 %  Troponin I     Status: Abnormal   Collection Time: 05/18/15  7:51 PM  Result Value Ref Range   Troponin I 6.98 (HH) <0.031 ng/mL    Comment:        POSSIBLE MYOCARDIAL ISCHEMIA. SERIAL TESTING RECOMMENDED. CRITICAL VALUE NOTED.  VALUE IS CONSISTENT WITH PREVIOUSLY REPORTED AND CALLED VALUE.   CBC     Status: Abnormal   Collection Time: 05/18/15  7:51 PM  Result Value Ref Range   WBC 12.5 (H) 4.0 - 10.5 K/uL   RBC 3.41 (L) 3.87 - 5.11 MIL/uL   Hemoglobin 7.4 (L) 12.0 - 15.0 g/dL   HCT 25.3 (L) 36.0 - 46.0 %   MCV 74.2 (L) 78.0 - 100.0 fL   MCH 21.7 (L) 26.0 - 34.0 pg   MCHC 29.2 (L) 30.0 - 36.0 g/dL   RDW 18.1 (H) 11.5 - 15.5 %   Platelets 195 150 - 400 K/uL  Glucose, capillary     Status: Abnormal   Collection Time: 05/18/15  9:17 PM  Result Value Ref Range   Glucose-Capillary 109 (H) 65 - 99 mg/dL   Comment 1 Capillary Specimen   Hemoglobin and hematocrit, blood     Status: Abnormal   Collection Time: 05/18/15 11:35 PM  Result Value Ref Range   Hemoglobin 7.3 (L) 12.0 - 15.0 g/dL   HCT 25.0 (L) 36.0 - 45.3 %  Basic metabolic panel     Status: Abnormal   Collection Time: 05/19/15  2:54 AM  Result Value Ref Range   Sodium 132 (L) 135 - 145 mmol/L   Potassium 3.6 3.5 - 5.1 mmol/L   Chloride 100 (L) 101 - 111 mmol/L   CO2 21 (L) 22 - 32 mmol/L   Glucose, Bld 112 (H) 65 - 99 mg/dL   BUN 25 (H) 6 - 20 mg/dL   Creatinine, Ser 1.19 (H) 0.44 - 1.00 mg/dL   Calcium 8.8 (L) 8.9 - 10.3 mg/dL   GFR calc non Af Amer 43 (L) >60 mL/min   GFR calc Af Amer 50 (L) >60 mL/min     Comment: (NOTE) The eGFR has been calculated using the CKD EPI equation. This calculation has not been validated in all clinical situations. eGFR's persistently <60 mL/min signify possible Chronic Kidney Disease.    Anion gap 11 5 - 15  CBC     Status: Abnormal   Collection Time: 05/19/15  5:17 AM  Result Value Ref Range   WBC 11.8 (H) 4.0 - 10.5 K/uL   RBC 3.47 (L) 3.87 - 5.11 MIL/uL   Hemoglobin 7.6 (L) 12.0 - 15.0 g/dL   HCT 25.6 (L) 36.0 - 46.0 %   MCV 73.8 (L) 78.0 - 100.0 fL   MCH 21.9 (L) 26.0 - 34.0 pg   MCHC 29.7 (L) 30.0 - 36.0 g/dL   RDW 18.4 (H) 11.5 - 15.5 %   Platelets 178 150 - 400 K/uL  Glucose, capillary     Status: Abnormal   Collection Time: 05/19/15  7:36 AM  Result Value Ref Range   Glucose-Capillary 121 (H) 65 - 99 mg/dL   Comment 1 Notify RN   Prepare RBC     Status: None   Collection Time: 05/19/15  7:40 AM  Result Value Ref Range   Order Confirmation ORDER PROCESSED BY BLOOD BANK  Imaging: X-ray Chest Pa And Lateral  05/18/2015  CLINICAL DATA:  Sepsis, chest heaviness, shortness of breath EXAM: CHEST  2 VIEW COMPARISON:  05/17/2015 FINDINGS: Cardiomegaly again noted. Central mild vascular congestion without convincing pulmonary edema. No segmental infiltrate. Mild basilar atelectasis. Mild degenerative changes thoracic spine. IMPRESSION: Cardiomegaly. Central vascular congestion without convincing pulmonary edema. Basilar atelectasis. Mild degenerative changes thoracic spine. Electronically Signed   By: Lahoma Crocker M.D.   On: 05/18/2015 08:04   US Renal  05/18/2015  CLINICAL DATA:  Acute renal failure EXAM: RENAL / URINARY TRACT ULTRASOUND COMPLETE COMPARISON:  None. FINDINGS: Right Kidney: Length: 10.6 cm. Echogenicity within normal limits. There is renal cortical thinning. No mass, perinephric fluid, or hydronephrosis visualized. No sonographically demonstrable calculus or ureterectasis. Left Kidney: Length: 11.5 cm. Echogenicity within normal  limits. There is renal cortical thinning. No mass, perinephric fluid, or hydronephrosis visualized. No sonographically demonstrable calculus or ureterectasis. Bladder: Appears normal for degree of bladder distention. IMPRESSION: Renal cortical thinning, a finding that may be associated with medical renal disease. The renal echogenicity, however, is normal. Study otherwise unremarkable. In particular, no obstructing foci identified on either side. Electronically Signed   By: Lowella Grip III M.D.   On: 05/18/2015 16:31   Dg Chest Port 1 View  05/17/2015  CLINICAL DATA:  Chest pain and dyspnea.  Duration 1 day. EXAM: PORTABLE CHEST 1 VIEW COMPARISON:  05/20/2013 FINDINGS: A single AP portable view of the chest demonstrates no focal airspace consolidation or alveolar edema. The lungs are grossly clear. There is no large effusion or pneumothorax. Cardiac and mediastinal contours appear unremarkable. IMPRESSION: No active disease. Electronically Signed   By: Andreas Newport M.D.   On: 05/17/2015 21:17    Assessment:  1. Principal Problem: 2.   Sepsis due to urinary tract infection ([place]) 3. Active Problems: 4.   Essential hypertension 5.   Symptomatic anemia 6.   Chest pain 7.   Sinus tachycardia ([place]) 8.   Acute kidney injury ([place]) 9.   Plan:  1. Agree with plan for GI evaluation tomorrow. Will need definitive cardiac assessment afterwards. If Hgb is stable and no definitive source is found, would advocate for cardiac catheterization. She had troponin elevation to 6.98 in the setting of demand ischemia/anemia with dynamic EKG changes, which constitutes a "failed stress test" - suggestive of coronary stenosis and higher than typically expected for small vessel ischemia. This would not be likely to happen until Friday. Continue medical therapy. Change pravastatin to atorvastatin 80 mg daily. Continue metoprolol. +1.5 L positive - give additional IV lasix today.  Time Spent Directly with  Patient:  15 minutes  Length of Stay:  LOS: 2 days   Pixie Casino, MD, [facility] Attending Cardiologist [place] 05/19/2015, 9:57 AM

## 2015-05-19 NOTE — Evaluation (Signed)
Physical Therapy Evaluation Patient Details Name: Mary Mccarthy MRN: [id number] DOB: 1940/03/01 Today's Date: 05/19/2015   History of Present Illness  75 year old female with a past history significant for PUD, HTN, HLD, DM type 2, GERD, depression; who presents with with reported syncopal event at home, SOB and chest pain. Found to have symptomatic anemia and Sepsis secondary to a urinary tract infection.  Clinical Impression  Patient demonstrates deficits in functional mobility as indicated below. Will need continued skilled PT to address deficits and maximize function. Will see as indicated and progress as tolerated.  OF NOTE: patient with incontinence of urine and stool, nsg aware. Educated on need to let nursing know when patient needs to use the restroom.      Follow Up Recommendations Supervision for mobility/OOB    Equipment Recommendations  Rolling walker with 5" wheels    Recommendations for Other Services       Precautions / Restrictions Precautions Precautions: Fall Restrictions Weight Bearing Restrictions: No      Mobility  Bed Mobility               General bed mobility comments: received in chair  Transfers Overall transfer level: Needs assistance Equipment used: Rolling walker (2 wheeled) Transfers: Sit to/from Stand Sit to Stand: Min guard         General transfer comment: performed x3 with no physical assist required. VCs for hand placement on chair when elevating to standing  Ambulation/Gait Ambulation/Gait assistance: Min guard Ambulation Distance (Feet): 20 Feet (89ft x2) Assistive device: Rolling walker (2 wheeled);None (20 ft with RW 20 ft without device) Gait Pattern/deviations: Step-through pattern;Decreased stride length;Drifts right/left Gait velocity: decreased   General Gait Details: modest instability noted and some fatigue post in room ambulation  Stairs            Wheelchair Mobility    Modified Rankin (Stroke  Patients Only)       Balance Overall balance assessment: History of Falls                                           Pertinent Vitals/Pain Pain Assessment: No/denies pain    Home Living Family/patient expects to be discharged to:: Private residence Living Arrangements: Spouse/significant other Available Help at Discharge: Family;Available 24 hours/day Type of Home: House Home Access: Stairs to enter Entrance Stairs-Rails: None Entrance Stairs-Number of Steps: 2 Home Layout: One level Home Equipment: None      Prior Function Level of Independence: Independent               Hand Dominance   Dominant Hand: Right    Extremity/Trunk Assessment   Upper Extremity Assessment: Overall WFL for tasks assessed           Lower Extremity Assessment: Overall WFL for tasks assessed         Communication   Communication: No difficulties  Cognition Arousal/Alertness: Awake/alert Behavior During Therapy: WFL for tasks assessed/performed Overall Cognitive Status: No family/caregiver present to determine baseline cognitive functioning (pt incontinent of stool and urine but did not call for assis)                      General Comments General comments (skin integrity, edema, etc.): VSS throughout session    Exercises        Assessment/Plan    PT Assessment Patient needs continued PT services  PT Diagnosis Difficulty walking;Abnormality of gait   PT Problem List Decreased strength;Decreased activity tolerance;Decreased balance;Decreased mobility;Decreased safety awareness  PT Treatment Interventions DME instruction;Gait training;Stair training;Functional mobility training;Therapeutic activities;Therapeutic exercise;Balance training;Patient/family education   PT Goals (Current goals can be found in the Care Plan section) Acute Rehab PT Goals Patient Stated Goal: to go home PT Goal Formulation: With patient Time For Goal Achievement:  06/02/15 Potential to Achieve Goals: Good    Frequency Min 3X/week   Barriers to discharge        Co-evaluation               End of Session Equipment Utilized During Treatment: Gait belt Activity Tolerance: No increased pain;limited by fatigue  Patient left: in chair;with call bell/phone within reach Nurse Communication: Mobility status         Time: 3419-3790 PT Time Calculation (min) (ACUTE ONLY): 18 min   Charges:   PT Evaluation $PT Eval Moderate Complexity: 1 Procedure     PT G CodesFabio Asa 2015-05-28, 5:01 PM  Charlotte Crumb, PT DPT  [phone]

## 2015-05-19 NOTE — Progress Notes (Signed)
Daily Rounding Note  05/19/2015, 8:18 AM  LOS: 2 days   SUBJECTIVE:       Feels tired, sore all over from sleeping in bed.  Still SOB.  4th unit blood running now, got 3 yesterday.   OBJECTIVE:         Vital signs in last 24 hours:    Temp:  [97.6 F (36.4 C)-101.1 F (38.4 C)] 98.6 F (37 C) (04/12 0500) Pulse Rate:  [59-82] 78 (04/12 0500) Resp:  [12-39] 19 (04/12 0500) BP: (91-170)/(46-113) 94/75 mmHg (04/12 0500) SpO2:  [93 %-100 %] 100 % (04/12 0500) Last BM Date: 05/18/15 Filed Weights   05/18/15 0044  Weight: 104.645 kg (230 lb 11.2 oz)   General: looks tired, not acutely ill   Heart: RRR with murmer Chest: clear bil.  Some dyspnea with activity Abdomen: soft, NT, ND.  Active BS  Extremities: no CCE Neuro/Psych:  Oriented x 3.  No tremor.   Intake/Output from previous day: 04/11 0701 - 04/12 0700 In: 905 [P.O.:470; Blood:335; IV Piggyback:100] Out: 100 [Urine:100]  Intake/Output this shift:    Lab Results:  Recent Labs  05/18/15 0748 05/18/15 1951 05/18/15 2335 05/19/15 0517  WBC 13.4* 12.5*  --  11.8*  HGB 7.0* 7.4*  7.5* 7.3* 7.6*  HCT 23.4* 25.3*  25.6* 25.0* 25.6*  PLT 197 195  --  178   BMET  Recent Labs  05/17/15 2100 05/18/15 0748 05/19/15 0254  NA 135 136 132*  K 3.6 4.0 3.6  CL 103 106 100*  CO2 20* 19* 21*  GLUCOSE 173* 154* 112*  BUN 21* 22* 25*  CREATININE 1.23* 1.16* 1.19*  CALCIUM 9.2 8.7* 8.8*   LFT  Recent Labs  05/17/15 2100 05/18/15 0748  PROT 6.3* 6.3*  ALBUMIN 3.1* 2.9*  AST 25 46*  ALT 13* 19  ALKPHOS 60 57  BILITOT 1.2 2.9*   PT/INR No results for input(s): LABPROT, INR in the last 72 hours. Hepatitis Panel No results for input(s): HEPBSAG, HCVAB, HEPAIGM, HEPBIGM in the last 72 hours.  Studies/Results: X-ray Chest Pa And Lateral  05/18/2015  CLINICAL DATA:  Sepsis, chest heaviness, shortness of breath EXAM: CHEST  2 VIEW COMPARISON:   05/17/2015 FINDINGS: Cardiomegaly again noted. Central mild vascular congestion without convincing pulmonary edema. No segmental infiltrate. Mild basilar atelectasis. Mild degenerative changes thoracic spine. IMPRESSION: Cardiomegaly. Central vascular congestion without convincing pulmonary edema. Basilar atelectasis. Mild degenerative changes thoracic spine. Electronically Signed   By: Natasha Mead M.D.   On: 05/18/2015 08:04   US Renal  05/18/2015  CLINICAL DATA:  Acute renal failure EXAM: RENAL / URINARY TRACT ULTRASOUND COMPLETE COMPARISON:  None. FINDINGS: Right Kidney: Length: 10.6 cm. Echogenicity within normal limits. There is renal cortical thinning. No mass, perinephric fluid, or hydronephrosis visualized. No sonographically demonstrable calculus or ureterectasis. Left Kidney: Length: 11.5 cm. Echogenicity within normal limits. There is renal cortical thinning. No mass, perinephric fluid, or hydronephrosis visualized. No sonographically demonstrable calculus or ureterectasis. Bladder: Appears normal for degree of bladder distention. IMPRESSION: Renal cortical thinning, a finding that may be associated with medical renal disease. The renal echogenicity, however, is normal. Study otherwise unremarkable. In particular, no obstructing foci identified on either side. Electronically Signed   By: Bretta Bang III M.D.   On: 05/18/2015 16:31   Dg Chest Port 1 View  05/17/2015  CLINICAL DATA:  Chest pain and dyspnea.  Duration 1 day. EXAM: PORTABLE CHEST 1  VIEW COMPARISON:  05/20/2013 FINDINGS: A single AP portable view of the chest demonstrates no focal airspace consolidation or alveolar edema. The lungs are grossly clear. There is no large effusion or pneumothorax. Cardiac and mediastinal contours appear unremarkable. IMPRESSION: No active disease. Electronically Signed   By: Ellery Plunk M.D.   On: 05/17/2015 21:17   Scheduled Meds: . sodium chloride  10 mL/hr Intravenous Once  . antiseptic  oral rinse  7 mL Mouth Rinse BID  . cefTRIAXone (ROCEPHIN)  IV  1 g Intravenous Q24H  . clonazePAM  0.5 mg Oral QHS  . ferumoxytol  510 mg Intravenous Weekly  . furosemide  20 mg Intravenous Once  . insulin aspart  0-5 Units Subcutaneous QHS  . insulin aspart  0-9 Units Subcutaneous TID WC  . metoprolol tartrate  12.5 mg Oral BID  . pantoprazole  40 mg Oral Q0600  . pravastatin  40 mg Oral QPM  . sodium chloride flush  3 mL Intravenous Q12H   Continuous Infusions:  PRN Meds:.acetaminophen **OR** [DISCONTINUED] acetaminophen, albuterol, diphenhydrAMINE, morphine injection, [DISCONTINUED] ondansetron **OR** ondansetron (ZOFRAN) IV   ASSESMENT:   * FOBT negative, iron deficiency anemia. Hx of same dates to 2015. Did not tolerated po iron.  PRBCs x 3. feraheme infusion 05/18/15.   * non STEMI. Cards has seen. ekg changes and progressive sxs (dyspnea and left scapular exertional pain) over 2 months. Considering cath. Echo pending. Previous EF 65 to 70% 11/2013.  Echo 05/18/15: now moderate aortic  stenosis. AVA around 1.1-1.2 cm2. There is Stage 2 DD with  elevated LV filling pressure, moderate TR, RVSP 51 mmHg with a  dilated IVC. Cardiology wants GI workup prior to cath and possible antiplatelet initiation.   * Fever.  On Rocephin.    PLAN   *  Proceed to colonoscopy and EGD.  Tomorrow 1230.    Mary Mccarthy  05/19/2015, 8:18 AM Pager: [phone]

## 2015-05-20 ENCOUNTER — Inpatient Hospital Stay (HOSPITAL_COMMUNITY): Payer: Medicare Other | Admitting: Certified Registered Nurse Anesthetist

## 2015-05-20 ENCOUNTER — Encounter (HOSPITAL_COMMUNITY): Payer: Self-pay | Admitting: *Deleted

## 2015-05-20 ENCOUNTER — Encounter (HOSPITAL_COMMUNITY)
Admission: EM | Disposition: A | Discharge status: Home / Self Care | Payer: Self-pay | Source: Home / Self Care | Attending: Internal Medicine

## 2015-05-20 DIAGNOSIS — I5032 Chronic diastolic (congestive) heart failure: Secondary | ICD-10-CM | POA: Diagnosis present

## 2015-05-20 DIAGNOSIS — N179 Acute kidney failure, unspecified: Secondary | ICD-10-CM

## 2015-05-20 DIAGNOSIS — K5520 Angiodysplasia of colon without hemorrhage: Secondary | ICD-10-CM

## 2015-05-20 DIAGNOSIS — K259 Gastric ulcer, unspecified as acute or chronic, without hemorrhage or perforation: Secondary | ICD-10-CM

## 2015-05-20 DIAGNOSIS — I5031 Acute diastolic (congestive) heart failure: Secondary | ICD-10-CM

## 2015-05-20 HISTORY — PX: COLONOSCOPY: SHX5424

## 2015-05-20 HISTORY — PX: ESOPHAGOGASTRODUODENOSCOPY: SHX5428

## 2015-05-20 LAB — TYPE AND SCREEN
ABO/RH(D): A POS
Antibody Screen: NEGATIVE
UNIT DIVISION: 0
Unit division: 0
Unit division: 0
Unit division: 0

## 2015-05-20 LAB — CBC
HCT: 27.9 % — ABNORMAL LOW (ref 36.0–46.0)
Hemoglobin: 8.2 g/dL — ABNORMAL LOW (ref 12.0–15.0)
MCH: 22.2 pg — ABNORMAL LOW (ref 26.0–34.0)
MCHC: 29.4 g/dL — ABNORMAL LOW (ref 30.0–36.0)
MCV: 75.4 fL — AB (ref 78.0–100.0)
PLATELETS: 204 10*3/uL (ref 150–400)
RBC: 3.7 MIL/uL — AB (ref 3.87–5.11)
RDW: 18.5 % — AB (ref 11.5–15.5)
WBC: 8.8 10*3/uL (ref 4.0–10.5)

## 2015-05-20 LAB — BASIC METABOLIC PANEL
ANION GAP: 9 (ref 5–15)
BUN: 16 mg/dL (ref 6–20)
CALCIUM: 9.1 mg/dL (ref 8.9–10.3)
CHLORIDE: 99 mmol/L — AB (ref 101–111)
CO2: 24 mmol/L (ref 22–32)
CREATININE: 0.86 mg/dL (ref 0.44–1.00)
GFR calc Af Amer: >60 mL/min (ref >60)
GFR calc non Af Amer: >60 mL/min (ref >60)
Glucose, Bld: 114 mg/dL — ABNORMAL HIGH (ref 65–99)
Potassium: 3.6 mmol/L (ref 3.5–5.1)
SODIUM: 132 mmol/L — AB (ref 135–145)

## 2015-05-20 LAB — URINE CULTURE: Culture: >=100000 — AB

## 2015-05-20 LAB — GLUCOSE, CAPILLARY
Glucose-Capillary: 129 mg/dL — ABNORMAL HIGH (ref 65–99)
Glucose-Capillary: 111 mg/dL — ABNORMAL HIGH (ref 65–99)
Glucose-Capillary: 169 mg/dL — ABNORMAL HIGH (ref 65–99)

## 2015-05-20 SURGERY — COLONOSCOPY
Anesthesia: General

## 2015-05-20 MED ORDER — BUTAMBEN-TETRACAINE-BENZOCAINE 2-2-14 % EX AERO
INHALATION_SPRAY | CUTANEOUS | Status: DC | PRN
  Administered 2015-05-20: 2 via TOPICAL

## 2015-05-20 MED ORDER — SODIUM CHLORIDE 0.9 % IV SOLN
INTRAVENOUS | Status: DC
Start: 2015-05-20 — End: 2015-05-20

## 2015-05-20 MED ORDER — ONDANSETRON HCL 4 MG/2ML IJ SOLN: 4.0000 mg | Freq: Once | INTRAMUSCULAR | Status: DC | PRN

## 2015-05-20 MED ORDER — FENTANYL CITRATE (PF) 100 MCG/2ML IJ SOLN: 25.0000 ug | INTRAMUSCULAR | Status: DC | PRN

## 2015-05-20 MED ORDER — FUROSEMIDE 10 MG/ML IJ SOLN
40.0000 mg | Freq: Two times a day (BID) | INTRAMUSCULAR | Status: AC
  Administered 2015-05-20 – 2015-05-21 (×3): 40 mg via INTRAVENOUS
  Filled 2015-05-20 (×3): qty 4

## 2015-05-20 MED ORDER — PANTOPRAZOLE SODIUM 40 MG PO TBEC
40.0000 mg | DELAYED_RELEASE_TABLET | Freq: Two times a day (BID) | ORAL | Status: DC
  Administered 2015-05-20 – 2015-05-23 (×6): 40 mg via ORAL
  Filled 2015-05-20 (×6): qty 1

## 2015-05-20 MED ORDER — PANTOPRAZOLE SODIUM 40 MG PO TBEC: 40.0000 mg | DELAYED_RELEASE_TABLET | Freq: Two times a day (BID) | ORAL | Status: DC

## 2015-05-20 MED ORDER — LACTATED RINGERS IV SOLN
INTRAVENOUS | Status: DC | PRN
  Administered 2015-05-20: 12:00:00 via INTRAVENOUS

## 2015-05-20 MED ORDER — PROPOFOL 500 MG/50ML IV EMUL
INTRAVENOUS | Status: DC | PRN
  Administered 2015-05-20: 100 ug/kg/min via INTRAVENOUS

## 2015-05-20 NOTE — Progress Notes (Signed)
DAILY PROGRESS NOTE  Subjective:  No events overnight. Prepped for GI evaluation today. H/H stable overnight. She is now recorded to be +4L since admit. "Feels better", but is still short of breath, especially lying down.  Objective:  Temp:  [97.2 F (36.2 C)-100.3 F (37.9 C)] 98.5 F (36.9 C) (04/13 0747) Pulse Rate:  [67-88] 76 (04/13 0747) Resp:  [16-37] 32 (04/13 0747) BP: (127-147)/(41-92) 144/41 mmHg (04/13 0747) SpO2:  [88 %-99 %] 99 % (04/13 0747) Weight change:   Intake/Output from previous day: 04/12 0701 - 04/13 0700 In: 3685 [P.O.:2300; Blood:335; IV Piggyback:50] Out: 650 [Urine:650]  Intake/Output from this shift:    Medications: Current Facility-Administered Medications  Medication Dose Route Frequency Provider Last Rate Last Dose  . 0.9 %  sodium chloride infusion  10 mL/hr Intravenous Once Leo Grosser, MD   10 mL/hr at 05/18/15 0101  . acetaminophen (TYLENOL) tablet 650 mg  650 mg Oral Q6H PRN Norval Morton, MD   650 mg at 05/19/15 0973  . albuterol (PROVENTIL) (2.5 MG/3ML) 0.083% nebulizer solution 2.5 mg  2.5 mg Nebulization Q2H PRN Norval Morton, MD   2.5 mg at 05/19/15 0011  . antiseptic oral rinse (CPC / CETYLPYRIDINIUM CHLORIDE 0.05%) solution 7 mL  7 mL Mouth Rinse BID Rondell Charmayne Sheer, MD   7 mL at 05/19/15 2200  . atorvastatin (LIPITOR) tablet 80 mg  80 mg Oral q1800 Pixie Casino, MD   80 mg at 05/19/15 1718  . cefTRIAXone (ROCEPHIN) 1 g in dextrose 5 % 50 mL IVPB  1 g Intravenous Q24H Franky Macho, RPH   1 g at 05/19/15 2212  . clonazePAM (KLONOPIN) tablet 0.5 mg  0.5 mg Oral QHS Thurnell Lose, MD   0.5 mg at 05/19/15 2211  . diphenhydrAMINE (BENADRYL) injection 25 mg  25 mg Intravenous Q6H PRN Thurnell Lose, MD      . ferumoxytol [facility]) 510 mg in sodium chloride 0.9 % 100 mL IVPB  510 mg Intravenous Weekly Thurnell Lose, MD   510 mg at 05/18/15 1209  . insulin aspart (novoLOG) injection 0-5 Units  0-5 Units Subcutaneous QHS  Norval Morton, MD   3 Units at 05/19/15 2211  . insulin aspart (novoLOG) injection 0-9 Units  0-9 Units Subcutaneous TID WC Norval Morton, MD   1 Units at 05/19/15 1717  . metoprolol tartrate (LOPRESSOR) tablet 12.5 mg  12.5 mg Oral BID Thurnell Lose, MD   12.5 mg at 05/19/15 2211  . morphine 2 MG/ML injection 2 mg  2 mg Intravenous Q2H PRN Norval Morton, MD      . ondansetron (ZOFRAN) injection 4 mg  4 mg Intravenous Q6H PRN Norval Morton, MD      . pantoprazole (PROTONIX) EC tablet 40 mg  40 mg Oral Q0600 Vena Rua, PA-C   40 mg at 05/20/15 0531  . sodium chloride flush (NS) 0.9 % injection 3 mL  3 mL Intravenous Q12H Norval Morton, MD   3 mL at 05/19/15 2200    Physical Exam: General appearance: alert and no distress Neck: JVD - 3 cm above sternal notch and no carotid bruit Lungs: diminished breath sounds bibasilar Heart: regular rate and rhythm, S1, S2 normal and systolic murmur: midsystolic 3/6, crescendo at 2nd left intercostal space Abdomen: soft, non-tender; bowel sounds normal; no masses,  no organomegaly and obese Extremities: extremities normal, atraumatic, no cyanosis or edema Pulses: 2+ and symmetric  Skin: Pale, cool, dry Neurologic: Grossly normal Psych: Pleasant  Lab Results: Results for orders placed or performed during the hospital encounter of 05/17/15 (from the past 48 hour(s))  Glucose, capillary     Status: Abnormal   Collection Time: 05/18/15 11:48 AM  Result Value Ref Range   Glucose-Capillary 122 (H) 65 - 99 mg/dL   Comment 1 Capillary Specimen   Prepare RBC     Status: None   Collection Time: 05/18/15 12:00 PM  Result Value Ref Range   Order Confirmation ORDER PROCESSED BY BLOOD BANK   Glucose, capillary     Status: Abnormal   Collection Time: 05/18/15  4:25 PM  Result Value Ref Range   Glucose-Capillary 163 (H) 65 - 99 mg/dL   Comment 1 Capillary Specimen   Hemoglobin and hematocrit, blood     Status: Abnormal   Collection Time:  05/18/15  7:51 PM  Result Value Ref Range   Hemoglobin 7.5 (L) 12.0 - 15.0 g/dL   HCT 25.6 (L) 36.0 - 46.0 %  Troponin I     Status: Abnormal   Collection Time: 05/18/15  7:51 PM  Result Value Ref Range   Troponin I 6.98 (HH) <0.031 ng/mL    Comment:        POSSIBLE MYOCARDIAL ISCHEMIA. SERIAL TESTING RECOMMENDED. CRITICAL VALUE NOTED.  VALUE IS CONSISTENT WITH PREVIOUSLY REPORTED AND CALLED VALUE.   CBC     Status: Abnormal   Collection Time: 05/18/15  7:51 PM  Result Value Ref Range   WBC 12.5 (H) 4.0 - 10.5 K/uL   RBC 3.41 (L) 3.87 - 5.11 MIL/uL   Hemoglobin 7.4 (L) 12.0 - 15.0 g/dL   HCT 25.3 (L) 36.0 - 46.0 %   MCV 74.2 (L) 78.0 - 100.0 fL   MCH 21.7 (L) 26.0 - 34.0 pg   MCHC 29.2 (L) 30.0 - 36.0 g/dL   RDW 18.1 (H) 11.5 - 15.5 %   Platelets 195 150 - 400 K/uL  Glucose, capillary     Status: Abnormal   Collection Time: 05/18/15  9:17 PM  Result Value Ref Range   Glucose-Capillary 109 (H) 65 - 99 mg/dL   Comment 1 Capillary Specimen   Hemoglobin and hematocrit, blood     Status: Abnormal   Collection Time: 05/18/15 11:35 PM  Result Value Ref Range   Hemoglobin 7.3 (L) 12.0 - 15.0 g/dL   HCT 25.0 (L) 36.0 - 38.1 %  Basic metabolic panel     Status: Abnormal   Collection Time: 05/19/15  2:54 AM  Result Value Ref Range   Sodium 132 (L) 135 - 145 mmol/L   Potassium 3.6 3.5 - 5.1 mmol/L   Chloride 100 (L) 101 - 111 mmol/L   CO2 21 (L) 22 - 32 mmol/L   Glucose, Bld 112 (H) 65 - 99 mg/dL   BUN 25 (H) 6 - 20 mg/dL   Creatinine, Ser 1.19 (H) 0.44 - 1.00 mg/dL   Calcium 8.8 (L) 8.9 - 10.3 mg/dL   GFR calc non Af Amer 43 (L) >60 mL/min   GFR calc Af Amer 50 (L) >60 mL/min    Comment: (NOTE) The eGFR has been calculated using the CKD EPI equation. This calculation has not been validated in all clinical situations. eGFR's persistently <60 mL/min signify possible Chronic Kidney Disease.    Anion gap 11 5 - 15  CBC     Status: Abnormal   Collection Time: 05/19/15  5:17  AM  Result Value Ref  Range   WBC 11.8 (H) 4.0 - 10.5 K/uL   RBC 3.47 (L) 3.87 - 5.11 MIL/uL   Hemoglobin 7.6 (L) 12.0 - 15.0 g/dL   HCT 25.6 (L) 36.0 - 46.0 %   MCV 73.8 (L) 78.0 - 100.0 fL   MCH 21.9 (L) 26.0 - 34.0 pg   MCHC 29.7 (L) 30.0 - 36.0 g/dL   RDW 18.4 (H) 11.5 - 15.5 %   Platelets 178 150 - 400 K/uL  Glucose, capillary     Status: Abnormal   Collection Time: 05/19/15  7:36 AM  Result Value Ref Range   Glucose-Capillary 121 (H) 65 - 99 mg/dL   Comment 1 Notify RN   Prepare RBC     Status: None   Collection Time: 05/19/15  7:40 AM  Result Value Ref Range   Order Confirmation ORDER PROCESSED BY BLOOD BANK   Glucose, capillary     Status: Abnormal   Collection Time: 05/19/15 11:05 AM  Result Value Ref Range   Glucose-Capillary 163 (H) 65 - 99 mg/dL  Glucose, capillary     Status: Abnormal   Collection Time: 05/19/15  4:14 PM  Result Value Ref Range   Glucose-Capillary 131 (H) 65 - 99 mg/dL   Comment 1 Notify RN   CBC with Differential/Platelet     Status: Abnormal   Collection Time: 05/19/15  5:30 PM  Result Value Ref Range   WBC 9.5 4.0 - 10.5 K/uL   RBC 3.67 (L) 3.87 - 5.11 MIL/uL   Hemoglobin 8.5 (L) 12.0 - 15.0 g/dL   HCT 27.6 (L) 36.0 - 46.0 %   MCV 75.2 (L) 78.0 - 100.0 fL   MCH 23.2 (L) 26.0 - 34.0 pg   MCHC 30.8 30.0 - 36.0 g/dL   RDW 18.2 (H) 11.5 - 15.5 %   Platelets 187 150 - 400 K/uL   Neutrophils Relative % 78 %   Neutro Abs 7.4 1.7 - 7.7 K/uL   Lymphocytes Relative 11 %   Lymphs Abs 1.0 0.7 - 4.0 K/uL   Monocytes Relative 10 %   Monocytes Absolute 1.0 0.1 - 1.0 K/uL   Eosinophils Relative 1 %   Eosinophils Absolute 0.1 0.0 - 0.7 K/uL   Basophils Relative 0 %   Basophils Absolute 0.0 0.0 - 0.1 K/uL  Glucose, capillary     Status: Abnormal   Collection Time: 05/19/15  9:26 PM  Result Value Ref Range   Glucose-Capillary 280 (H) 65 - 99 mg/dL   Comment 1 Notify RN   CBC     Status: Abnormal   Collection Time: 05/20/15  3:04 AM  Result Value  Ref Range   WBC 8.8 4.0 - 10.5 K/uL   RBC 3.70 (L) 3.87 - 5.11 MIL/uL   Hemoglobin 8.2 (L) 12.0 - 15.0 g/dL   HCT 27.9 (L) 36.0 - 46.0 %   MCV 75.4 (L) 78.0 - 100.0 fL   MCH 22.2 (L) 26.0 - 34.0 pg   MCHC 29.4 (L) 30.0 - 36.0 g/dL   RDW 18.5 (H) 11.5 - 15.5 %   Platelets 204 150 - 400 K/uL  Basic metabolic panel     Status: Abnormal   Collection Time: 05/20/15  3:04 AM  Result Value Ref Range   Sodium 132 (L) 135 - 145 mmol/L   Potassium 3.6 3.5 - 5.1 mmol/L   Chloride 99 (L) 101 - 111 mmol/L   CO2 24 22 - 32 mmol/L   Glucose, Bld 114 (H) 65 -  99 mg/dL   BUN 16 6 - 20 mg/dL   Creatinine, Ser 0.86 0.44 - 1.00 mg/dL   Calcium 9.1 8.9 - 10.3 mg/dL   GFR calc non Af Amer >60 >60 mL/min   GFR calc Af Amer >60 >60 mL/min    Comment: (NOTE) The eGFR has been calculated using the CKD EPI equation. This calculation has not been validated in all clinical situations. eGFR's persistently <60 mL/min signify possible Chronic Kidney Disease.    Anion gap 9 5 - 15  Glucose, capillary     Status: Abnormal   Collection Time: 05/20/15  7:51 AM  Result Value Ref Range   Glucose-Capillary 129 (H) 65 - 99 mg/dL   Comment 1 Capillary Specimen     Imaging: US Renal  05/18/2015  CLINICAL DATA:  Acute renal failure EXAM: RENAL / URINARY TRACT ULTRASOUND COMPLETE COMPARISON:  None. FINDINGS: Right Kidney: Length: 10.6 cm. Echogenicity within normal limits. There is renal cortical thinning. No mass, perinephric fluid, or hydronephrosis visualized. No sonographically demonstrable calculus or ureterectasis. Left Kidney: Length: 11.5 cm. Echogenicity within normal limits. There is renal cortical thinning. No mass, perinephric fluid, or hydronephrosis visualized. No sonographically demonstrable calculus or ureterectasis. Bladder: Appears normal for degree of bladder distention. IMPRESSION: Renal cortical thinning, a finding that may be associated with medical renal disease. The renal echogenicity, however, is  normal. Study otherwise unremarkable. In particular, no obstructing foci identified on either side. Electronically Signed   By: Lowella Grip III M.D.   On: 05/18/2015 16:31    Assessment:  Principal Problem:   Sepsis due to urinary tract infection ([place]) Active Problems:   Essential hypertension   Symptomatic anemia   Chest pain   Sinus tachycardia ([facility])   Acute kidney injury ([place])   Demand ischemia of myocardium ([facility])   Acute diastolic (congestive) heart failure ([place])   Plan:  Plan GI evaluation for bleeding today. Will need definitive cardiac assessment at some point - seems to need diuresis first. If Hgb is stable and no definitive source is found, would advocate for cardiac catheterization, possibly early next week. She had troponin elevation to 6.98 in the setting of demand ischemia/anemia with dynamic EKG changes, which constitutes a "failed stress test" - suggestive of coronary stenosis and higher than typically expected for small vessel ischemia. Recommend starting lasix 40 mg BID this afternoon following her procedure.  Time Spent Directly with Patient:  15 minutes  Length of Stay:  LOS: 3 days   Pixie Casino, MD, [facility] Attending Cardiologist [place] 05/20/2015, 9:23 AM

## 2015-05-20 NOTE — Transfer of Care (Signed)
Immediate Anesthesia Transfer of Care Note  Patient: Mary Mccarthy  Procedure(s) Performed: Procedure(s): COLONOSCOPY (N/A) ESOPHAGOGASTRODUODENOSCOPY (EGD) (N/A)  Patient Location: Endoscopy Unit  Anesthesia Type:MAC  Level of Consciousness: awake, alert  and oriented  Airway & Oxygen Therapy: Patient Spontanous Breathing and Patient connected to nasal cannula oxygen  Post-op Assessment: Report given to RN and Post -op Vital signs reviewed and stable  Post vital signs: Reviewed and stable  Last Vitals:  Filed Vitals:   05/20/15 0747 05/20/15 1137  BP: 144/41 176/63  Pulse: 76 75  Temp: 36.9 C 36.6 C  Resp: 32 20    Complications: No apparent anesthesia complications

## 2015-05-20 NOTE — Progress Notes (Signed)
[facility] Progress Note                                                                                                                                                                                                                      Patient Demographics:    Mary Mccarthy, is a 75 y.o. female, DOB - [birth year], WGN:[id number]  Admit date - 05/17/2015   Admitting Physician [name], MD  Outpatient Primary MD for the patient is [name], MD  LOS - 3  Outpatient Specialists: [facility]  Chief Complaint  Patient presents with  . Chest Pain  . Shortness of Breath    Summary  75 year old female with a past history significant for PUD, HTN, HLD, DM type 2, GERD, depression who was admitted on 05/17/2015 with clinical impression of significant UTI, symptomatic iron deficiency anemia with chest pain and demand ischemia causing NSTEMI. She received 3 units of packed RBC transfusion, she's been seen by GI and cardiology.  Her UTI is stable with IV Rocephin which should be continued for 7 days, she is undergoing GI workup with EGD colonoscopy to rule out a GI source of blood loss, if she is stable from GI standpoint cardiology is contemplating left heart catheterization in the next few days. Her GI workup should be done later on 05/20/2015.    Subjective:    Mary Mccarthy today has, No headache, No chest pain, No abdominal pain - No Nausea, No new weakness tingling or numbness, No Cough - SOB. Feels Better this morning.     Assessment  & Plan :     1. Severe symptomatic severe iron deficiency anemia. Likely due to chronic occult GI blood loss, Hemoccult negative in ER, severe iron deficiency on anemia panel. She received 3 units of packed RBC  transfusion on 05/18/2015  and will get another unit of packed RBC on 05/19/2015 to keep hemoglobin around 8 in the setting of non-STEMI, received IV iron on 05/18/2015, stable on PPI GI following contemplating EGD/colonoscopy 05/20/2015.  2. NSTEMI with moderate AS. Demand ischemia causing troponin rise. EKG nonacute, currently pain-free, beta blocker and statin continued, echo noted with EF of 55-60% without any wall motion abnormality but with moderate AS, cardiology consulted, further testing and management per cardiology. Completing GI workup to provide room for left heart cath and antiplatelet medications if needed.  3. Acute renal failure due to anemia. Resolved after packed RBC transfusion.  4. UTI. On Rocephin follow cultures, I do not think she was septic, elevated lactate was due to severe anemia. She is nontoxic. Nonacute renal ultrasound with no signs of pyelonephritis or obstruction.  Finish antibiotics on 05/23/2015.  5. Dyslipidemia. On statin.  6. DM type II. On sliding scale.  Lab Results  Component Value Date   HGBA1C 7.1* 11/20/2014   CBG (last 3)   Recent Labs  05/19/15 1614 05/19/15 2126 05/20/15 0751  GLUCAP 131* 280* 129*     Code Status : Full  Family Communication  : None present  Disposition Plan  : Stay inpatient  Barriers For Discharge : GI and cardiology workup  Consults  :  GI, Cards  Procedures  :    Echogram - EF 60%, Compared to a prior study in 2015, there is now moderate aortic stenosis. AVA around 1.1-1.2 cm2. There is Stage 2 DD with elevated LV filling pressure, moderate TR, RVSP 51 mmHg with a dilated IVC.  Renal ultrasound - Non acute  EGD colonoscopy due on 05/20/2015    DVT Prophylaxis  :   SCDs    Lab Results  Component Value Date   PLT 204 05/20/2015    Antibiotics  :    Anti-infectives    Start     Dose/Rate Route Frequency Ordered Stop   05/18/15 2200  cefTRIAXone (ROCEPHIN) 1 g in dextrose 5 % 50 mL IVPB     1  g 100 mL/hr over 30 Minutes Intravenous Every 24 hours 05/18/15 0032     05/18/15 0030  cefTRIAXone (ROCEPHIN) 2 g in dextrose 5 % 50 mL IVPB  Status:  Discontinued     2 g 100 mL/hr over 30 Minutes Intravenous  Once 05/18/15 0020 05/18/15 0032   05/17/15 2315  cefTRIAXone (ROCEPHIN) 1 g in dextrose 5 % 50 mL IVPB     1 g 100 mL/hr over 30 Minutes Intravenous  Once 05/17/15 2311 05/17/15 2358        Objective:   Filed Vitals:   05/20/15 0200 05/20/15 0400 05/20/15 0600 05/20/15 0747  BP:  133/48  144/41  Pulse: 78 73 80 76  Temp:  99.3 F (37.4 C)  98.5 F (36.9 C)  TempSrc:  Oral  Oral  Resp: 29 37 20 32  Height:      Weight:      SpO2: 88% 99% 98% 99%    Wt Readings from Last 3 Encounters:  05/18/15 104.645 kg (230 lb 11.2 oz)  11/20/14 100.245 kg (221 lb)  05/21/14 101.606 kg (224 lb)     Intake/Output Summary (Last 24 hours) at 05/20/15 0917 Last data filed at 05/20/15 0530  Gross per 24 hour  Intake   3455 ml  Output    650 ml  Net   2805 ml     Physical Exam  Awake Alert, Oriented X 3,  No new F.N deficits, Normal affect [place].AT,PERRAL Supple Neck,No JVD, No cervical lymphadenopathy appriciated.  Symmetrical Chest wall movement, Good air movement bilaterally, CTAB RRR,No Gallops,Rubs or new Murmurs, No Parasternal Heave +ve B.Sounds, Abd Soft, No tenderness, No organomegaly appriciated, No rebound - guarding or rigidity. No Cyanosis, Clubbing or edema, No new Rash or bruise      Data Review:    CBC  Recent Labs Lab 05/17/15 2100 05/18/15 0748 05/18/15 1951 05/18/15 2335 05/19/15 0517 05/19/15 1730 05/20/15 0304  WBC 12.5* 13.4* 12.5*  --  11.8* 9.5 8.8  HGB 5.0* 7.0* 7.4*  7.5* 7.3* 7.6* 8.5* 8.2*  HCT 18.8* 23.4* 25.3*  25.6* 25.0* 25.6* 27.6* 27.9*  PLT 233 197 195  --  178 187 204  MCV 70.9* 73.4* 74.2*  --  73.8* 75.2* 75.4*  MCH 18.9* 21.9* 21.7*  --  21.9* 23.2* 22.2*  MCHC 26.6* 29.9* 29.2*  --  29.7* 30.8 29.4*  RDW 17.9* 18.2*  18.1*  --  18.4* 18.2* 18.5*  LYMPHSABS 0.5*  --   --   --   --  1.0  --   MONOABS 0.6  --   --   --   --  1.0  --   EOSABS 0.0  --   --   --   --  0.1  --   BASOSABS 0.0  --   --   --   --  0.0  --     Chemistries   Recent Labs Lab 05/17/15 2100 05/18/15 0748 05/19/15 0254 05/20/15 0304  NA 135 136 132* 132*  K 3.6 4.0 3.6 3.6  CL 103 106 100* 99*  CO2 20* 19* 21* 24  GLUCOSE 173* 154* 112* 114*  BUN 21* 22* 25* 16  CREATININE 1.23* 1.16* 1.19* 0.86  CALCIUM 9.2 8.7* 8.8* 9.1  AST 25 46*  --   --   ALT 13* 19  --   --   ALKPHOS 60 57  --   --   BILITOT 1.2 2.9*  --   --    ------------------------------------------------------------------------------------------------------------------ No results for input(s): CHOL, HDL, LDLCALC, TRIG, CHOLHDL, LDLDIRECT in the last 72 hours.  Lab Results  Component Value Date   HGBA1C 7.1* 11/20/2014   ------------------------------------------------------------------------------------------------------------------ No results for input(s): TSH, T4TOTAL, T3FREE, THYROIDAB in the last 72 hours.  Invalid input(s): FREET3 ------------------------------------------------------------------------------------------------------------------  Recent Labs  05/17/15 2209 05/17/15 2210  VITAMINB12  --  241  FOLATE 13.9  --   FERRITIN  --  4*  TIBC  --  434  IRON  --  21*  RETICCTPCT  --  1.8    Coagulation profile No results for input(s): INR, PROTIME in the last 168 hours.  No results for input(s): DDIMER in the last 72 hours.  Cardiac Enzymes  Recent Labs Lab 05/18/15 0750 05/18/15 1951  TROPONINI 5.24* 6.98*   ------------------------------------------------------------------------------------------------------------------    Component Value Date/Time   BNP 168.3* 05/17/2015 2141    Inpatient Medications  Scheduled Meds: . sodium chloride  10 mL/hr Intravenous Once  . antiseptic oral rinse  7 mL Mouth Rinse BID  .  atorvastatin  80 mg Oral q1800  . cefTRIAXone (ROCEPHIN)  IV  1 g Intravenous Q24H  . clonazePAM  0.5 mg Oral QHS  . ferumoxytol  510 mg Intravenous Weekly  . insulin aspart  0-5 Units Subcutaneous QHS  . insulin aspart  0-9 Units Subcutaneous TID WC  . metoprolol tartrate  12.5 mg Oral BID  .  pantoprazole  40 mg Oral Q0600  . sodium chloride flush  3 mL Intravenous Q12H   Continuous Infusions:  PRN Meds:.acetaminophen **OR** [DISCONTINUED] acetaminophen, albuterol, diphenhydrAMINE, morphine injection, [DISCONTINUED] ondansetron **OR** ondansetron (ZOFRAN) IV  Micro Results Recent Results (from the past 240 hour(s))  Blood Culture (routine x 2)     Status: None (Preliminary result)   Collection Time: 05/17/15  8:55 PM  Result Value Ref Range Status   Specimen Description BLOOD LEFT ANTECUBITAL  Final   Special Requests IN PEDIATRIC BOTTLE 2CC  Final   Culture NO GROWTH 2 DAYS  Final   Report Status PENDING  Incomplete  Blood Culture (routine x 2)     Status: None (Preliminary result)   Collection Time: 05/17/15  9:00 PM  Result Value Ref Range Status   Specimen Description BLOOD RIGHT FOREARM  Final   Special Requests BOTTLES DRAWN AEROBIC AND ANAEROBIC 5CC  Final   Culture NO GROWTH 2 DAYS  Final   Report Status PENDING  Incomplete  Urine culture     Status: Abnormal (Preliminary result)   Collection Time: 05/17/15 10:22 PM  Result Value Ref Range Status   Specimen Description URINE, CATHETERIZED  Final   Special Requests NONE  Final   Culture >=100,000 COLONIES/mL ESCHERICHIA COLI (A)  Final   Report Status PENDING  Incomplete  MRSA PCR Screening     Status: None   Collection Time: 05/18/15  2:05 AM  Result Value Ref Range Status   MRSA by PCR NEGATIVE NEGATIVE Final    Comment:        The GeneXpert MRSA Assay (FDA approved for NASAL specimens only), is one component of a comprehensive MRSA colonization surveillance program. It is not intended to diagnose  MRSA infection nor to guide or monitor treatment for MRSA infections.   Culture, blood (routine x 2)     Status: None (Preliminary result)   Collection Time: 05/18/15  8:58 AM  Result Value Ref Range Status   Specimen Description BLOOD RIGHT HAND  Final   Special Requests IN PEDIATRIC BOTTLE  3CC  Final   Culture NO GROWTH 1 DAY  Final   Report Status PENDING  Incomplete  Culture, blood (routine x 2)     Status: None (Preliminary result)   Collection Time: 05/18/15  9:02 AM  Result Value Ref Range Status   Specimen Description BLOOD RIGHT ARM  Final   Special Requests IN PEDIATRIC BOTTLE 3CC  Final   Culture NO GROWTH 1 DAY  Final   Report Status PENDING  Incomplete    Radiology Reports X-ray Chest Pa And Lateral  05/18/2015  CLINICAL DATA:  Sepsis, chest heaviness, shortness of breath EXAM: CHEST  2 VIEW COMPARISON:  05/17/2015 FINDINGS: Cardiomegaly again noted. Central mild vascular congestion without convincing pulmonary edema. No segmental infiltrate. Mild basilar atelectasis. Mild degenerative changes thoracic spine. IMPRESSION: Cardiomegaly. Central vascular congestion without convincing pulmonary edema. Basilar atelectasis. Mild degenerative changes thoracic spine. Electronically Signed   By: Natasha Mead M.D.   On: 05/18/2015 08:04   US Renal  05/18/2015  CLINICAL DATA:  Acute renal failure EXAM: RENAL / URINARY TRACT ULTRASOUND COMPLETE COMPARISON:  None. FINDINGS: Right Kidney: Length: 10.6 cm. Echogenicity within normal limits. There is renal cortical thinning. No mass, perinephric fluid, or hydronephrosis visualized. No sonographically demonstrable calculus or ureterectasis. Left Kidney: Length: 11.5 cm. Echogenicity within normal limits. There is renal cortical thinning. No mass, perinephric fluid, or hydronephrosis visualized. No sonographically demonstrable calculus or ureterectasis.  Bladder: Appears normal for degree of bladder distention. IMPRESSION: Renal cortical thinning,  a finding that may be associated with medical renal disease. The renal echogenicity, however, is normal. Study otherwise unremarkable. In particular, no obstructing foci identified on either side. Electronically Signed   By: Bretta Bang III M.D.   On: 05/18/2015 16:31   Dg Chest Port 1 View  05/17/2015  CLINICAL DATA:  Chest pain and dyspnea.  Duration 1 day. EXAM: PORTABLE CHEST 1 VIEW COMPARISON:  05/20/2013 FINDINGS: A single AP portable view of the chest demonstrates no focal airspace consolidation or alveolar edema. The lungs are grossly clear. There is no large effusion or pneumothorax. Cardiac and mediastinal contours appear unremarkable. IMPRESSION: No active disease. Electronically Signed   By: Ellery Plunk M.D.   On: 05/17/2015 21:17    Time Spent in minutes   30   SINGH,PRASHANT K M.D on 05/20/2015 at 9:17 AM  Between 7am to 7pm - Pager - [phone]  After 7pm go to [email or web address] - password [facility]  [facility] -  Office  [phone]

## 2015-05-20 NOTE — H&P (View-Only) (Signed)
        Daily Rounding Note  05/19/2015, 8:18 AM  LOS: 2 days   SUBJECTIVE:       Feels tired, sore all over from sleeping in bed.  Still SOB.  4th unit blood running now, got 3 yesterday.   OBJECTIVE:         Vital signs in last 24 hours:    Temp:  [97.6 F (36.4 C)-101.1 F (38.4 C)] 98.6 F (37 C) (04/12 0500) Pulse Rate:  [59-82] 78 (04/12 0500) Resp:  [12-39] 19 (04/12 0500) BP: (91-170)/(46-113) 94/75 mmHg (04/12 0500) SpO2:  [93 %-100 %] 100 % (04/12 0500) Last BM Date: 05/18/15 Filed Weights   05/18/15 0044  Weight: 104.645 kg (230 lb 11.2 oz)   General: looks tired, not acutely ill   Heart: RRR with murmer Chest: clear bil.  Some dyspnea with activity Abdomen: soft, NT, ND.  Active BS  Extremities: no CCE Neuro/Psych:  Oriented x 3.  No tremor.   Intake/Output from previous day: 04/11 0701 - 04/12 0700 In: 905 [P.O.:470; Blood:335; IV Piggyback:100] Out: 100 [Urine:100]  Intake/Output this shift:    Lab Results:  Recent Labs  05/18/15 0748 05/18/15 1951 05/18/15 2335 05/19/15 0517  WBC 13.4* 12.5*  --  11.8*  HGB 7.0* 7.4*  7.5* 7.3* 7.6*  HCT 23.4* 25.3*  25.6* 25.0* 25.6*  PLT 197 195  --  178   BMET  Recent Labs  05/17/15 2100 05/18/15 0748 05/19/15 0254  NA 135 136 132*  K 3.6 4.0 3.6  CL 103 106 100*  CO2 20* 19* 21*  GLUCOSE 173* 154* 112*  BUN 21* 22* 25*  CREATININE 1.23* 1.16* 1.19*  CALCIUM 9.2 8.7* 8.8*   LFT  Recent Labs  05/17/15 2100 05/18/15 0748  PROT 6.3* 6.3*  ALBUMIN 3.1* 2.9*  AST 25 46*  ALT 13* 19  ALKPHOS 60 57  BILITOT 1.2 2.9*   PT/INR No results for input(s): LABPROT, INR in the last 72 hours. Hepatitis Panel No results for input(s): HEPBSAG, HCVAB, HEPAIGM, HEPBIGM in the last 72 hours.  Studies/Results: X-ray Chest Pa And Lateral  05/18/2015  CLINICAL DATA:  Sepsis, chest heaviness, shortness of breath EXAM: CHEST  2 VIEW COMPARISON:   05/17/2015 FINDINGS: Cardiomegaly again noted. Central mild vascular congestion without convincing pulmonary edema. No segmental infiltrate. Mild basilar atelectasis. Mild degenerative changes thoracic spine. IMPRESSION: Cardiomegaly. Central vascular congestion without convincing pulmonary edema. Basilar atelectasis. Mild degenerative changes thoracic spine. Electronically Signed   By: Liviu  Pop M.D.   On: 05/18/2015 08:04   Us Renal  05/18/2015  CLINICAL DATA:  Acute renal failure EXAM: RENAL / URINARY TRACT ULTRASOUND COMPLETE COMPARISON:  None. FINDINGS: Right Kidney: Length: 10.6 cm. Echogenicity within normal limits. There is renal cortical thinning. No mass, perinephric fluid, or hydronephrosis visualized. No sonographically demonstrable calculus or ureterectasis. Left Kidney: Length: 11.5 cm. Echogenicity within normal limits. There is renal cortical thinning. No mass, perinephric fluid, or hydronephrosis visualized. No sonographically demonstrable calculus or ureterectasis. Bladder: Appears normal for degree of bladder distention. IMPRESSION: Renal cortical thinning, a finding that may be associated with medical renal disease. The renal echogenicity, however, is normal. Study otherwise unremarkable. In particular, no obstructing foci identified on either side. Electronically Signed   By: William  Woodruff III M.D.   On: 05/18/2015 16:31   Dg Chest Port 1 View  05/17/2015  CLINICAL DATA:  Chest pain and dyspnea.  Duration 1 day. EXAM: PORTABLE CHEST 1   VIEW COMPARISON:  05/20/2013 FINDINGS: A single AP portable view of the chest demonstrates no focal airspace consolidation or alveolar edema. The lungs are grossly clear. There is no large effusion or pneumothorax. Cardiac and mediastinal contours appear unremarkable. IMPRESSION: No active disease. Electronically Signed   By: Ellery Plunk M.D.   On: 05/17/2015 21:17   Scheduled Meds: . sodium chloride  10 mL/hr Intravenous Once  . antiseptic  oral rinse  7 mL Mouth Rinse BID  . cefTRIAXone (ROCEPHIN)  IV  1 g Intravenous Q24H  . clonazePAM  0.5 mg Oral QHS  . ferumoxytol  510 mg Intravenous Weekly  . furosemide  20 mg Intravenous Once  . insulin aspart  0-5 Units Subcutaneous QHS  . insulin aspart  0-9 Units Subcutaneous TID WC  . metoprolol tartrate  12.5 mg Oral BID  . pantoprazole  40 mg Oral Q0600  . pravastatin  40 mg Oral QPM  . sodium chloride flush  3 mL Intravenous Q12H   Continuous Infusions:  PRN Meds:.acetaminophen **OR** [DISCONTINUED] acetaminophen, albuterol, diphenhydrAMINE, morphine injection, [DISCONTINUED] ondansetron **OR** ondansetron (ZOFRAN) IV   ASSESMENT:   * FOBT negative, iron deficiency anemia. Hx of same dates to 2015. Did not tolerated po iron.  PRBCs x 3. feraheme infusion 05/18/15.   * non STEMI. Cards has seen. ekg changes and progressive sxs (dyspnea and left scapular exertional pain) over 2 months. Considering cath. Echo pending. Previous EF 65 to 70% 11/2013.  Echo 05/18/15: now moderate aortic  stenosis. AVA around 1.1-1.2 cm2. There is Stage 2 DD with  elevated LV filling pressure, moderate TR, RVSP 51 mmHg with a  dilated IVC. Cardiology wants GI workup prior to cath and possible antiplatelet initiation.   * Fever.  On Rocephin.    PLAN   *  Proceed to colonoscopy and EGD.  Tomorrow 1230.    Mary Mccarthy  05/19/2015, 8:18 AM Pager: [phone]

## 2015-05-20 NOTE — Anesthesia Postprocedure Evaluation (Signed)
Anesthesia Post Note  Patient: Mary Mccarthy  Procedure(s) Performed: Procedure(s) (LRB): COLONOSCOPY (N/A) ESOPHAGOGASTRODUODENOSCOPY (EGD) (N/A)  Patient location during evaluation: Endoscopy Anesthesia Type: MAC Level of consciousness: awake, awake and alert and oriented Pain management: pain level controlled Vital Signs Assessment: post-procedure vital signs reviewed and stable Respiratory status: spontaneous breathing and nonlabored ventilation Cardiovascular status: blood pressure returned to baseline Anesthetic complications: no    Last Vitals:  Filed Vitals:   05/20/15 1600 05/20/15 1634  BP: 133/61   Pulse: 68   Temp:  36.8 C  Resp: 23     Last Pain:  Filed Vitals:   05/20/15 1641  PainSc: 2                  Darrill Vreeland COKER

## 2015-05-20 NOTE — Anesthesia Preprocedure Evaluation (Signed)
Anesthesia Evaluation  Patient identified by MRN, date of birth, ID band Patient awake    Reviewed: Allergy & Precautions, NPO status , Patient's Chart, lab work & pertinent test results  Airway Mallampati: II   Neck ROM: Full    Dental  (+) Edentulous Upper, Edentulous Lower   Pulmonary former smoker,    breath sounds clear to auscultation       Cardiovascular hypertension,  Rhythm:Regular Rate:Normal     Neuro/Psych    GI/Hepatic   Endo/Other  diabetes  Renal/GU      Musculoskeletal   Abdominal (+) + obese,   Peds  Hematology   Anesthesia Other Findings   Reproductive/Obstetrics                             Anesthesia Physical Anesthesia Plan  ASA: III  Anesthesia Plan: General   Post-op Pain Management:    Induction: Intravenous  Airway Management Planned: Natural Airway and Nasal Cannula  Additional Equipment:   Intra-op Plan:   Post-operative Plan:   Informed Consent: I have reviewed the patients History and Physical, chart, labs and discussed the procedure including the risks, benefits and alternatives for the proposed anesthesia with the patient or authorized representative who has indicated his/her understanding and acceptance.     Plan Discussed with: CRNA and Anesthesiologist  Anesthesia Plan Comments:         Anesthesia Quick Evaluation

## 2015-05-20 NOTE — Op Note (Signed)
[facility] Patient Name: Mary Mccarthy Procedure Date : 05/20/2015 MRN: [id number] Attending MD: Willaim Rayas. Armbruster MD, MD Date of Birth: 25-Aug-1940 CSN: [id number] Age: 75 Admit Type: Inpatient Procedure:                Colonoscopy Indications:              Iron deficiency anemia Providers:                Viviann Spare P. Armbruster MD, MD, Elby Showers, RN,                            Harrington Challenger, Technician Referring MD:              Medicines:                Monitored Anesthesia Care Complications:            No immediate complications. Estimated blood loss:                            Minimal. Estimated Blood Loss:     Estimated blood loss was minimal. Procedure:                Pre-Anesthesia Assessment:                           - Prior to the procedure, a History and Physical                            was performed, and patient medications and                            allergies were reviewed. The patient's tolerance of                            previous anesthesia was also reviewed. The risks                            and benefits of the procedure and the sedation                            options and risks were discussed with the patient.                            All questions were answered, and informed consent                            was obtained. Prior Anticoagulants: The patient has                            taken aspirin, last dose was 1 day prior to                            procedure. ASA Grade Assessment: III - A patient  with severe systemic disease. After reviewing the                            risks and benefits, the patient was deemed in                            satisfactory condition to undergo the procedure.                           After obtaining informed consent, the colonoscope                            was passed under direct vision. Throughout the                            procedure, the patient's  blood pressure, pulse, and                            oxygen saturations were monitored continuously. The                            EC-[id number] ([id number]) scope was introduced through                            the anus and advanced to the the terminal ileum,                            with identification of the appendiceal orifice and                            IC valve. The colonoscopy was performed without                            difficulty. The patient tolerated the procedure                            well. The quality of the bowel preparation was                            adequate. The terminal ileum, ileocecal valve,                            appendiceal orifice, and rectum were photographed. Scope In: 1:02:51 PM Scope Out: 1:19:02 PM Total Procedure Duration: 0 hours 16 minutes 11 seconds  Findings:      The perianal and digital rectal examinations were normal.      The terminal ileum appeared normal.      Two medium-sized localized angiodysplastic lesions without bleeding were       found in the ascending colon and in the cecum. Fulguration to ablate the       lesion by argon plasma was successful.      Non-bleeding internal hemorrhoids were found during retroflexion. The       hemorrhoids were moderate.      The exam was otherwise without abnormality. No  other pathology was noted       to account for the patient's anemia Impression:               - The examined portion of the ileum was normal.                           - Two non-bleeding colonic angiodysplastic lesions.                            Treated with argon plasma coagulation (APC).                           - Non-bleeding internal hemorrhoids.                           - The examination was otherwise normal.                           - No specimens collected.                           Overall, AVMs could have contributed to anemia, in                            conjunction with gastric ulcers, and were  treated Moderate Sedation:      no moderate sedation Recommendation:           - Return patient to hospital ward for ongoing care.                           - Resume previous diet.                           - Continue present medications.                           - No NSAIDS other than aspirin                           - Okay to proceed with further cardiac workup as                            needed                           - No repeat colonoscopy due to age. Procedure Code(s):        --- Professional ---                           [phone], Colonoscopy, flexible; with ablation of                            tumor(s), polyp(s), or other lesion(s) (includes                            pre- and post-dilation and guide wire passage, when  performed) Diagnosis Code(s):        --- Professional ---                           K64.8, Other hemorrhoids                           K55.20, Angiodysplasia of colon without hemorrhage                           D50.9, Iron deficiency anemia, unspecified CPT copyright 2016 American [employer] Association. All rights reserved. The codes documented in this report are preliminary and upon coder review may  be revised to meet current compliance requirements. Viviann Spare P. Armbruster MD, MD 05/20/2015 1:30:34 PM This report has been signed electronically. Number of Addenda: 0

## 2015-05-20 NOTE — Interval H&P Note (Signed)
History and Physical Interval Note:  05/20/2015 12:15 PM  Mary Mccarthy  has presented today for surgery, with the diagnosis of anemia.  The various methods of treatment have been discussed with the patient and family. After consideration of risks, benefits and other options for treatment, the patient has consented to  Procedure(s): COLONOSCOPY (N/A) ESOPHAGOGASTRODUODENOSCOPY (EGD) (N/A) as a surgical intervention .  The patient's history has been reviewed, patient examined, no change in status, stable for surgery.  I have reviewed the patient's chart and labs.  Questions were answered to the patient's satisfaction.     Reeves Forth Corwyn Vora

## 2015-05-20 NOTE — Care Management Important Message (Signed)
Important Message  Patient Details  Name: Alexanda Bare MRN: [id number] Date of Birth: 1941-01-22   Medicare Important Message Given:  Yes    Oralia Rud Mancel Lardizabal 05/20/2015, 12:50 PM

## 2015-05-20 NOTE — Op Note (Signed)
[facility] Patient Name: Mary Mccarthy Procedure Date : 05/20/2015 MRN: [id number] Attending MD: Willaim Rayas. [place] Mullett MD, MD Date of Birth: 1940/12/22 CSN: [id number] Age: 75 Admit Type: Inpatient Procedure:                Upper GI endoscopy Indications:              Iron deficiency anemia Providers:                Viviann Spare P. Terrick Allred MD, MD, Michel Bickers, RN,                            Harrington Challenger, Technician Referring MD:              Medicines:                Monitored Anesthesia Care Complications:            No immediate complications. Estimated blood loss:                            Minimal. Estimated Blood Loss:     Estimated blood loss was minimal. Procedure:                Pre-Anesthesia Assessment:                           - Prior to the procedure, a History and Physical                            was performed, and patient medications and                            allergies were reviewed. The patient's tolerance of                            previous anesthesia was also reviewed. The risks                            and benefits of the procedure and the sedation                            options and risks were discussed with the patient.                            All questions were answered, and informed consent                            was obtained. Prior Anticoagulants: The patient has                            taken aspirin, last dose was 1 day prior to                            procedure. ASA Grade Assessment: III - A patient  with severe systemic disease. After reviewing the                            risks and benefits, the patient was deemed in                            satisfactory condition to undergo the procedure.                           After obtaining informed consent, the endoscope was                            passed under direct vision. Throughout the                            procedure, the  patient's blood pressure, pulse, and                            oxygen saturations were monitored continuously. The                            [id number] ([id number]) scope was introduced through the                            mouth, and advanced to the second part of duodenum.                            The upper GI endoscopy was accomplished without                            difficulty. The patient tolerated the procedure                            well. Scope In: Scope Out: Findings:      Esophagogastric landmarks were identified: the Z-line was found at 42       cm, the gastroesophageal junction was found at 42 cm and the site of       hiatal narrowing was found at 42 cm from the incisors.      The exam of the esophagus was otherwise normal.      A few non-bleeding linear gastric ulcers with no stigmata of bleeding       were found in the proximal gastric body along the greater curvature,       upwards of 5mm in length.      Patchy moderate inflammation characterized by erosions and erythema was       found in the gastric body and fundus. Biopsies were taken with a cold       forceps for Helicobacter pylori testing from the antrum and body.      The exam of the stomach was otherwise normal.      The duodenal bulb and second portion of the duodenum were normal. Impression:               - Esophagogastric landmarks identified.                           -  Non-bleeding linear gastric ulcers with no                            stigmata of bleeding.                           - Gastritis. Biopsied.                           - Normal duodenal bulb and second portion of the                            duodenum.                           Overall, gastritis and gastric ulcers could have                            caused anemia, in addition to colonoscopy findings.                            No high risk stigmata of bleeding appreciated. Moderate Sedation:      no moderate sedation Recommendation:            - Return patient to hospital ward for ongoing care.                           - Resume previous diet.                           - Continue present medications.                           - Increase protonix to  twice daily                           - Await pathology results                           - Recommend IV iron infusion                           - Okay to proceed with further cardiac workup as                            needed Procedure Code(s):        --- Professional ---                           [phone], Esophagogastroduodenoscopy, flexible,                            transoral; with biopsy, single or multiple Diagnosis Code(s):        --- Professional ---                           K25.9, Gastric ulcer, unspecified as acute or  chronic, without hemorrhage or perforation                           K29.70, Gastritis, unspecified, without bleeding                           D50.9, Iron deficiency anemia, unspecified CPT copyright 2016 American Medical Association. All rights reserved. The codes documented in this report are preliminary and upon coder review may  be revised to meet current compliance requirements. Viviann Spare P. Brantly Kalman MD, MD 05/20/2015 1:36:22 PM This report has been signed electronically. Number of Addenda: 0

## 2015-05-21 ENCOUNTER — Encounter (HOSPITAL_COMMUNITY)
Admission: EM | Disposition: A | Discharge status: Home / Self Care | Payer: Self-pay | Source: Home / Self Care | Attending: Internal Medicine

## 2015-05-21 DIAGNOSIS — I272 Other secondary pulmonary hypertension: Secondary | ICD-10-CM

## 2015-05-21 DIAGNOSIS — K253 Acute gastric ulcer without hemorrhage or perforation: Secondary | ICD-10-CM

## 2015-05-21 DIAGNOSIS — Q2733 Arteriovenous malformation of digestive system vessel: Secondary | ICD-10-CM

## 2015-05-21 DIAGNOSIS — N179 Acute kidney failure, unspecified: Secondary | ICD-10-CM | POA: Diagnosis present

## 2015-05-21 DIAGNOSIS — K922 Gastrointestinal hemorrhage, unspecified: Secondary | ICD-10-CM

## 2015-05-21 DIAGNOSIS — R000 Tachycardia, unspecified: Secondary | ICD-10-CM

## 2015-05-21 DIAGNOSIS — I5032 Chronic diastolic (congestive) heart failure: Secondary | ICD-10-CM

## 2015-05-21 DIAGNOSIS — I2510 Atherosclerotic heart disease of native coronary artery without angina pectoris: Secondary | ICD-10-CM

## 2015-05-21 HISTORY — PX: CARDIAC CATHETERIZATION: SHX172

## 2015-05-21 LAB — GLUCOSE, CAPILLARY
GLUCOSE-CAPILLARY: 111 mg/dL — AB (ref 65–99)
Glucose-Capillary: 152 mg/dL — ABNORMAL HIGH (ref 65–99)
Glucose-Capillary: 193 mg/dL — ABNORMAL HIGH (ref 65–99)
Glucose-Capillary: 224 mg/dL — ABNORMAL HIGH (ref 65–99)

## 2015-05-21 LAB — PROTIME-INR
INR: 1.18 (ref 0.00–1.49)
Prothrombin Time: 15.2 seconds (ref 11.6–15.2)

## 2015-05-21 SURGERY — LEFT HEART CATH AND CORONARY ANGIOGRAPHY
Anesthesia: LOCAL

## 2015-05-21 MED ORDER — MIDAZOLAM HCL 2 MG/2ML IJ SOLN
INTRAMUSCULAR | Status: AC
  Filled 2015-05-21: qty 2

## 2015-05-21 MED ORDER — SODIUM CHLORIDE 0.9 % IV SOLN: 250.0000 mL | INTRAVENOUS | Status: DC | PRN

## 2015-05-21 MED ORDER — HEPARIN SODIUM (PORCINE) 1000 UNIT/ML IJ SOLN
INTRAMUSCULAR | Status: AC
  Filled 2015-05-21: qty 1

## 2015-05-21 MED ORDER — VERAPAMIL HCL 2.5 MG/ML IV SOLN
INTRAVENOUS | Status: AC
  Filled 2015-05-21: qty 2

## 2015-05-21 MED ORDER — FENTANYL CITRATE (PF) 100 MCG/2ML IJ SOLN
INTRAMUSCULAR | Status: AC
  Filled 2015-05-21: qty 2

## 2015-05-21 MED ORDER — FENTANYL CITRATE (PF) 100 MCG/2ML IJ SOLN
INTRAMUSCULAR | Status: DC | PRN
  Administered 2015-05-21: 25 ug via INTRAVENOUS

## 2015-05-21 MED ORDER — HEPARIN (PORCINE) IN NACL 2-0.9 UNIT/ML-% IJ SOLN
INTRAMUSCULAR | Status: DC | PRN
  Administered 2015-05-21: 1500 mL

## 2015-05-21 MED ORDER — HEPARIN SODIUM (PORCINE) 1000 UNIT/ML IJ SOLN
INTRAMUSCULAR | Status: DC | PRN
  Administered 2015-05-21: 4500 [IU] via INTRAVENOUS

## 2015-05-21 MED ORDER — SODIUM CHLORIDE 0.9 % WEIGHT BASED INFUSION: 1.0000 mL/kg/h | INTRAVENOUS | Status: AC

## 2015-05-21 MED ORDER — IOPAMIDOL (ISOVUE-370) INJECTION 76%
INTRAVENOUS | Status: DC | PRN
  Administered 2015-05-21: 85 mL via INTRA_ARTERIAL

## 2015-05-21 MED ORDER — SODIUM CHLORIDE 0.9% FLUSH: 3.0000 mL | INTRAVENOUS | Status: DC | PRN

## 2015-05-21 MED ORDER — MIDAZOLAM HCL 2 MG/2ML IJ SOLN
INTRAMUSCULAR | Status: DC | PRN
  Administered 2015-05-21: 1 mg via INTRAVENOUS

## 2015-05-21 MED ORDER — SODIUM CHLORIDE 0.9% FLUSH
3.0000 mL | Freq: Two times a day (BID) | INTRAVENOUS | Status: DC
  Administered 2015-05-21: 3 mL via INTRAVENOUS

## 2015-05-21 MED ORDER — ASPIRIN 81 MG PO CHEW
81.0000 mg | CHEWABLE_TABLET | Freq: Once | ORAL | Status: AC
  Administered 2015-05-21: 81 mg via ORAL
  Filled 2015-05-21: qty 1

## 2015-05-21 MED ORDER — LIDOCAINE HCL (PF) 1 % IJ SOLN
INTRAMUSCULAR | Status: DC | PRN
Start: 2015-05-21 — End: 2015-05-21
  Administered 2015-05-21: 4 mL

## 2015-05-21 MED ORDER — VERAPAMIL HCL 2.5 MG/ML IV SOLN
INTRAVENOUS | Status: DC | PRN
  Administered 2015-05-21: 15:00:00 via INTRA_ARTERIAL

## 2015-05-21 MED ORDER — SODIUM CHLORIDE 0.9 % IV SOLN
INTRAVENOUS | Status: DC
  Administered 2015-05-21: 12:00:00 via INTRAVENOUS

## 2015-05-21 MED ORDER — LIDOCAINE HCL (PF) 1 % IJ SOLN
INTRAMUSCULAR | Status: AC
  Filled 2015-05-21: qty 30

## 2015-05-21 MED ORDER — SODIUM CHLORIDE 0.9% FLUSH
3.0000 mL | Freq: Two times a day (BID) | INTRAVENOUS | Status: DC
  Administered 2015-05-22 – 2015-05-23 (×3): 3 mL via INTRAVENOUS

## 2015-05-21 SURGICAL SUPPLY — 10 items
CATH INFINITI 5FR ANG PIGTAIL (CATHETERS) ×2 IMPLANT
CATH OPTITORQUE TIG 4.0 5F (CATHETERS) ×2 IMPLANT
DEVICE RAD COMP TR BAND LRG (VASCULAR PRODUCTS) ×2 IMPLANT
GLIDESHEATH SLEND SS 6F .021 (SHEATH) ×2 IMPLANT
KIT HEART LEFT (KITS) ×2 IMPLANT
PACK CARDIAC CATHETERIZATION (CUSTOM PROCEDURE TRAY) ×2 IMPLANT
SYR MEDRAD MARK V 150ML (SYRINGE) ×2 IMPLANT
TRANSDUCER W/STOPCOCK (MISCELLANEOUS) ×2 IMPLANT
TUBING CIL FLEX 10 FLL-RA (TUBING) ×2 IMPLANT
WIRE SAFE-T 1.5MM-J .035X260CM (WIRE) ×2 IMPLANT

## 2015-05-21 NOTE — H&P (View-Only) (Signed)
DAILY PROGRESS NOTE  Subjective:  No events overnight. 2 AVM's cauterized yesterday by GI. H/H has been stable. Renal function appears back to normal. LVEF 55-60%.  Objective:  Temp:  [97.9 F (36.6 C)-98.6 F (37 C)] 98.4 F (36.9 C) (04/14 0848) Pulse Rate:  [67-81] 81 (04/14 0848) Resp:  [17-37] 28 (04/14 0848) BP: (104-176)/(45-86) 145/59 mmHg (04/14 0848) SpO2:  [96 %-100 %] 98 % (04/14 0848) Weight change:   Intake/Output from previous day: 04/13 0701 - 04/14 0700 In: 950 [P.O.:600; I.V.:300; IV Piggyback:50] Out: 542 [Urine:953]  Intake/Output from this shift: Total I/O In: 240 [P.O.:240] Out: 75 [Urine:75]  Medications: Current Facility-Administered Medications  Medication Dose Route Frequency Provider Last Rate Last Dose  . 0.9 %  sodium chloride infusion  10 mL/hr Intravenous Once Leo Grosser, MD   10 mL/hr at 05/18/15 0101  . acetaminophen (TYLENOL) tablet 650 mg  650 mg Oral Q6H PRN Norval Morton, MD   650 mg at 05/19/15 7062  . albuterol (PROVENTIL) (2.5 MG/3ML) 0.083% nebulizer solution 2.5 mg  2.5 mg Nebulization Q2H PRN Norval Morton, MD   2.5 mg at 05/19/15 0011  . antiseptic oral rinse (CPC / CETYLPYRIDINIUM CHLORIDE 0.05%) solution 7 mL  7 mL Mouth Rinse BID Norval Morton, MD   7 mL at 05/21/15 0943  . atorvastatin (LIPITOR) tablet 80 mg  80 mg Oral q1800 Pixie Casino, MD   80 mg at 05/20/15 1807  . cefTRIAXone (ROCEPHIN) 1 g in dextrose 5 % 50 mL IVPB  1 g Intravenous Q24H Franky Macho, RPH   1 g at 05/20/15 2211  . clonazePAM (KLONOPIN) tablet 0.5 mg  0.5 mg Oral QHS Thurnell Lose, MD   0.5 mg at 05/20/15 2200  . diphenhydrAMINE (BENADRYL) injection 25 mg  25 mg Intravenous Q6H PRN Thurnell Lose, MD      . fentaNYL (SUBLIMAZE) injection 25-50 mcg  25-50 mcg Intravenous Q5 min PRN Roberts Gaudy, MD      . ferumoxytol [facility]) 510 mg in sodium chloride 0.9 % 100 mL IVPB  510 mg Intravenous Weekly Thurnell Lose, MD   510 mg at  05/18/15 1209  . furosemide (LASIX) injection 40 mg  40 mg Intravenous BID Pixie Casino, MD   40 mg at 05/21/15 0940  . insulin aspart (novoLOG) injection 0-5 Units  0-5 Units Subcutaneous QHS Norval Morton, MD   3 Units at 05/19/15 2211  . insulin aspart (novoLOG) injection 0-9 Units  0-9 Units Subcutaneous TID WC Norval Morton, MD   2 Units at 05/21/15 [phone]  . metoprolol tartrate (LOPRESSOR) tablet 12.5 mg  12.5 mg Oral BID Thurnell Lose, MD   12.5 mg at 05/21/15 0949  . morphine 2 MG/ML injection 2 mg  2 mg Intravenous Q2H PRN Norval Morton, MD      . ondansetron (ZOFRAN) injection 4 mg  4 mg Intravenous Q6H PRN Norval Morton, MD      . ondansetron (ZOFRAN) injection 4 mg  4 mg Intravenous Once PRN Roberts Gaudy, MD      . pantoprazole (PROTONIX) EC tablet 40 mg  40 mg Oral BID Vena Rua, PA-C   40 mg at 05/21/15 0940  . sodium chloride flush (NS) 0.9 % injection 3 mL  3 mL Intravenous Q12H Norval Morton, MD   3 mL at 05/21/15 0950    Physical Exam: General appearance: alert and no distress Neck:  JVD - 3 cm above sternal notch and no carotid bruit Lungs: diminished breath sounds bibasilar Heart: regular rate and rhythm, S1, S2 normal and systolic murmur: midsystolic 3/6, crescendo at 2nd left intercostal space Abdomen: soft, non-tender; bowel sounds normal; no masses,  no organomegaly and obese Extremities: extremities normal, atraumatic, no cyanosis or edema Pulses: 2+ and symmetric Skin: Pale, cool, dry Neurologic: Grossly normal Psych: Pleasant  Lab Results: Results for orders placed or performed during the hospital encounter of 05/17/15 (from the past 48 hour(s))  Glucose, capillary     Status: Abnormal   Collection Time: 05/19/15 11:05 AM  Result Value Ref Range   Glucose-Capillary 163 (H) 65 - 99 mg/dL  Glucose, capillary     Status: Abnormal   Collection Time: 05/19/15  4:14 PM  Result Value Ref Range   Glucose-Capillary 131 (H) 65 - 99 mg/dL   Comment  1 Notify RN   CBC with Differential/Platelet     Status: Abnormal   Collection Time: 05/19/15  5:30 PM  Result Value Ref Range   WBC 9.5 4.0 - 10.5 K/uL   RBC 3.67 (L) 3.87 - 5.11 MIL/uL   Hemoglobin 8.5 (L) 12.0 - 15.0 g/dL   HCT 19.7 (L) 18.5 - 69.2 %   MCV 75.2 (L) 78.0 - 100.0 fL   MCH 23.2 (L) 26.0 - 34.0 pg   MCHC 30.8 30.0 - 36.0 g/dL   RDW 69.9 (H) 78.7 - 46.6 %   Platelets 187 150 - 400 K/uL   Neutrophils Relative % 78 %   Neutro Abs 7.4 1.7 - 7.7 K/uL   Lymphocytes Relative 11 %   Lymphs Abs 1.0 0.7 - 4.0 K/uL   Monocytes Relative 10 %   Monocytes Absolute 1.0 0.1 - 1.0 K/uL   Eosinophils Relative 1 %   Eosinophils Absolute 0.1 0.0 - 0.7 K/uL   Basophils Relative 0 %   Basophils Absolute 0.0 0.0 - 0.1 K/uL  Glucose, capillary     Status: Abnormal   Collection Time: 05/19/15  9:26 PM  Result Value Ref Range   Glucose-Capillary 280 (H) 65 - 99 mg/dL   Comment 1 Notify RN   CBC     Status: Abnormal   Collection Time: 05/20/15  3:04 AM  Result Value Ref Range   WBC 8.8 4.0 - 10.5 K/uL   RBC 3.70 (L) 3.87 - 5.11 MIL/uL   Hemoglobin 8.2 (L) 12.0 - 15.0 g/dL   HCT 35.5 (L) 63.4 - 69.5 %   MCV 75.4 (L) 78.0 - 100.0 fL   MCH 22.2 (L) 26.0 - 34.0 pg   MCHC 29.4 (L) 30.0 - 36.0 g/dL   RDW 84.8 (H) 74.2 - 54.9 %   Platelets 204 150 - 400 K/uL  Basic metabolic panel     Status: Abnormal   Collection Time: 05/20/15  3:04 AM  Result Value Ref Range   Sodium 132 (L) 135 - 145 mmol/L   Potassium 3.6 3.5 - 5.1 mmol/L   Chloride 99 (L) 101 - 111 mmol/L   CO2 24 22 - 32 mmol/L   Glucose, Bld 114 (H) 65 - 99 mg/dL   BUN 16 6 - 20 mg/dL   Creatinine, Ser 3.82 0.44 - 1.00 mg/dL   Calcium 9.1 8.9 - 35.1 mg/dL   GFR calc non Af Amer >60 >60 mL/min   GFR calc Af Amer >60 >60 mL/min    Comment: (NOTE) The eGFR has been calculated using the CKD EPI equation. This  calculation has not been validated in all clinical situations. eGFR's persistently <60 mL/min signify possible  Chronic Kidney Disease.    Anion gap 9 5 - 15  Glucose, capillary     Status: Abnormal   Collection Time: 05/20/15  7:51 AM  Result Value Ref Range   Glucose-Capillary 129 (H) 65 - 99 mg/dL   Comment 1 Capillary Specimen   Glucose, capillary     Status: Abnormal   Collection Time: 05/20/15  4:35 PM  Result Value Ref Range   Glucose-Capillary 111 (H) 65 - 99 mg/dL   Comment 1 Capillary Specimen   Glucose, capillary     Status: Abnormal   Collection Time: 05/20/15  9:37 PM  Result Value Ref Range   Glucose-Capillary 169 (H) 65 - 99 mg/dL   Comment 1 Capillary Specimen   Glucose, capillary     Status: Abnormal   Collection Time: 05/21/15  8:48 AM  Result Value Ref Range   Glucose-Capillary 152 (H) 65 - 99 mg/dL    Imaging: No results found.  Assessment:  Principal Problem:   Sepsis due to urinary tract infection ([place]) Active Problems:   Essential hypertension   Symptomatic anemia   Chest pain   Sinus tachycardia (HCC)   Acute kidney injury ([place])   Demand ischemia of myocardium (HCC)   Acute diastolic (congestive) heart failure (HCC)   Gastric ulceration   AVM (arteriovenous malformation) of colon   Plan:  Discussed definitive LHC with her, which I would proceed with later today with Dr. Ellyn Hack. Please keep NPO for procedure. We discussed risks/benefits/alternatives to the procedure and she is agreeable to proceed.  Time Spent Directly with Patient:  15 minutes  Length of Stay:  LOS: 4 days   Pixie Casino, MD, [facility] Attending Cardiologist [place] 05/21/2015, 11:04 AM

## 2015-05-21 NOTE — Interval H&P Note (Signed)
History and Physical Interval Note:  05/21/2015 2:49 PM  Mary Mccarthy  has presented today for surgery, with the diagnosis of nstemi, SVT, GI bleed.   The various methods of treatment have been discussed with the patient and family.   After consideration of risks, benefits and other options for treatment, the patient has consented to  Procedure(s): Left Heart Cath and Coronary Angiography (N/A) wth Possible Percutaneous Coronary Intervention as a surgical intervention .  The patient's history has been reviewed, patient examined, no change in status, stable for surgery.  I have reviewed the patient's chart and labs.  Questions were answered to the patient's satisfaction.    Cath Lab Visit (complete for each Cath Lab visit)  Clinical Evaluation Leading to the Procedure:   ACS: Yes.    Non-ACS:    Anginal Classification: No Symptoms  Anti-ischemic medical therapy: Minimal Therapy (1 class of medications)  Non-Invasive Test Results: No non-invasive testing performed  Prior CABG: No previous CABG  TIMI Score  Patient Information:  TIMI Score is 3   UA/NSTEMI and intermediate-risk features (e.g., TIMI score 3-4) for short-term risk of death or nonfatal MI  Revascularization of the presumed culprit artery   A (9)  Indication: 10; Score: 9    HARDING, DAVID W

## 2015-05-21 NOTE — Progress Notes (Addendum)
[place] TEAM 1 - Stepdown/ICU TEAM Progress Note  Mary Mccarthy JTT:[id number] DOB: [birth year] DOA: 05/17/2015 PCP: [name], MD  Admit HPI / Brief Narrative: This Urness is a 75 year old WF PMHx Depression PUD, Colonic Polyps, HTN, Heart Murmur, HLD, DM type 2, GERD,;   who presents with multiple complaints. Patient had reportedly had a syncopal event at home she fell upon waking complained of shortness of breath and chest pain. She notes for the last 2 months at least she's had progressively worsening shortness of breath. Reports difficulty walking more than 200 feet without being extremely exhausted. However, symptoms appeared to worsen today where she reports intermittent subjective fever and chills all day. She was unable to move all to count due to extreme fatigue. Had associated symptoms of which she reports is intermittent "flip flopping" of her heart as she attributed to an irregular heartbeat. Within the last few days she has noted increased urinary frequency at night. Order which she does not usually have. Notes multiple recent sick contacts has many members of her family have been sick with pneumonia and flu. She herself has been doing a lot of coughing and complaints of backaches and left shoulder blade pain She notes a known history of iron deficiency anemia, but was unable to take iron supplementation due to reports of diarrhea. Patient denies any black stools, abdominal pain, diarrhea, or constipation.  EMS had been called by the patient's family. In route because of chest pain complaints patient had been given 325 mg of aspirin. On arrival heart rates were noted to be up to 128, temperature 102.66F, respirations 36, blood pressure 103/50, and O2 saturations 97%. Initial lab work revealed WBC 12.5, hemoglobin 5, CO2 20, BUN 21 creatinine 1.23, glucose 173, and lactic acid 3.5. UA was positive for many bacteria. Code sepsis was called as the patient was type and screen, ordered to be  transfused 2 units of PRBCs, and started on Rocephin.     HPI/Subjective: 4/14 A/O 4, NAD, negative CP, negative SOB, negative N/V  Assessment/Plan: Sepsis secondary to a urinary tract infectio positive Escherichia coli: - Will complete series of antibiotics 4/15  Acute kidney injury:(baseline creatinine is 0.8),  -Multifactorial to include severe anemia, and UTI  -On admission creatinine elevated at 1.23 with a BUN of 21.  - Still slightly elevated but resolving  Symptomatic Anemia with reported Syncopal and Fall:  -Hemoglobin 5 on admission patient with guaiac-negative stools.  - T&S and ordered to be transfused 2 units of blood  - Will check serial H&H and transfuse as as needed - Check electrolytes and replace as needed  Acute GI bleed -EGD and colonoscopy showed several areas of potential GI bleed but no current bleeding see results below -Protonix 40 mg BID  Iron deficiency Anemia - May consider placing patient on ferrous sulfate with vitamin C see if less GI upset  Leukocytosis: - See sepsis  Elevated Troponin: -Trops have increased from 0.37>>5.24, given the patients report over worsening symptoms over the past 2 months, EKG changes and episode of chest pain along with extreme weakness yesterday, would consider a cath -Pt needs to be worked up from GI stand point to determine treatment, considering she has receiving 2 units of PRBC and hgb remains at 7, prior to cardiology intervention and antiplatelet therapy -Will discuss with Dr. Eldridge Dace regarding treatment plan -Previous Echo 11/28/2013 EF 65-70%, NWMA, mild AS. Echo from today is pending  Chest pain/MI - Troponins 3 positive -Cardiac catheterization negative for occlusion  causing MI. MI most likely secondary to patient's severe anemia. -See cardiac catheterization findings below   Chronic diastolic CHF/Pulmonary Hypertension  Sinus tachycardia  -Most likely secondary to anemia and sepsis  - Continue  to monitor  Essential Hypertension  - Hold hydrochlorothiazide secondary to AKI  Diabetes mellitus type 2 - Held metformin and Amaryl - Hypoglycemic protocols -CBGs every before meals and at bedtime with sensitive sliding scale of insulin     Code Status: FULL Family Communication: no family present at time of exam Disposition Plan: Per cardiology and GI    Consultants: Dr.Jayadeep S Varanasi cardiology Dr.Steven Maylon [facility]     Procedure/Significant Events: Transfused 2 units of PRBC 4/11 echocardiogram- Left ventricle: moderate LVH.-LVEF=  55%- 60%. -(grade 2 diastolic dysfunction).  - Aortic valve:Marland Kitchen Moderate stenosis. - Left atrium: Severely dilated at 52 ml/m2. - Right atrium: The atrium was at the upper limits of normal- Tricuspid valve: moderate regurgitation. - Pulmonary arteries: Dilated. PA peak pressure: 51 mm Hg (S). 4/12 EGD; - Non-bleeding linear gastric ulcers - Gastritis. Biopsied. -gastritis and gastric ulcers could have caused anemia,  4/12 Colonoscopy;  - Two non-bleeding colonic angiodysplastic lesions. Tx Argon Plasma Coagulation (APC).- Non-bleeding internal hemorrhoids. -Overall, AVMs could have contributed to anemia,  4/12 left heart catheterization;Ost Ramus lesion, 60% stenosed.-Ost RCA lesion, 30% stenosed.-Dist LAD lesion, tapers to a small vessel with diffuse roughly 40-50% stenosis. -No culprit lesion noted to explain the patient's elevated troponin. Most significant lesion in the ostial ramus intermedius. This is not PCI target    Culture 4/10 blood right forearm/left AC NGTD 4/10 urine positive Escherichia coli 4/11 MRSA by PCR negative 4/11 blood right hand/arm NGTD   Antibiotics: Rocephin 4/10>> 4/15   DVT prophylaxis: SCD   Devices    LINES / TUBES:      Continuous Infusions: . sodium chloride 1 mL/kg/hr (05/21/15 1615)    Objective: VITAL SIGNS: Temp: 98.8 F (37.1 C) (04/14 1627) Temp Source: Oral  (04/14 1627) BP: 123/49 mmHg (04/14 1700) Pulse Rate: 65 (04/14 1700) SPO2; FIO2:   Intake/Output Summary (Last 24 hours) at 05/21/15 1805 Last data filed at 05/21/15 1600  Gross per 24 hour  Intake 566.25 ml  Output   1026 ml  Net -459.75 ml     Exam: General: A/O 4, NAD, No acute respiratory distress Eyes: negative scleral hemorrhage ENT: Negative Runny nose, negative gingival bleeding, Neck:  Negative scars, masses, torticollis, lymphadenopathy, JVD Lungs: Clear to auscultation bilaterally without wheezes or crackles Cardiovascular: Regular rate and rhythm positive systolic murmur (chronic) grade 2/5, negative gallop or rub normal S1 and S2 Abdomen:negative abdominal pain, nondistended, positive soft, bowel sounds, no rebound, no ascites, no appreciable mass Extremities: No significant cyanosis, clubbing, or edema bilateral lower extremities Psychiatric:  Negative depression, negative anxiety, negative fatigue, negative mania  Neurologic:  Cranial nerves II through XII intact, tongue/uvula midline, all extremities muscle strength 5/5, sensation intact throughout, negative dysarthria, negative expressive aphasia, negative receptive aphasia.   Data Reviewed: Basic Metabolic Panel:  Recent Labs Lab 05/17/15 2100 05/18/15 0748 05/19/15 0254 05/20/15 0304  NA 135 136 132* 132*  K 3.6 4.0 3.6 3.6  CL 103 106 100* 99*  CO2 20* 19* 21* 24  GLUCOSE 173* 154* 112* 114*  BUN 21* 22* 25* 16  CREATININE 1.23* 1.16* 1.19* 0.86  CALCIUM 9.2 8.7* 8.8* 9.1   Liver Function Tests:  Recent Labs Lab 05/17/15 2100 05/18/15 0748  AST 25 46*  ALT 13* 19  ALKPHOS 60 57  BILITOT 1.2 2.9*  PROT 6.3* 6.3*  ALBUMIN 3.1* 2.9*   No results for input(s): LIPASE, AMYLASE in the last 168 hours. No results for input(s): AMMONIA in the last 168 hours. CBC:  Recent Labs Lab 05/17/15 2100 05/18/15 0748 05/18/15 1951 05/18/15 2335 05/19/15 0517 05/19/15 1730 05/20/15 0304  WBC  12.5* 13.4* 12.5*  --  11.8* 9.5 8.8  NEUTROABS 11.4*  --   --   --   --  7.4  --   HGB 5.0* 7.0* 7.4*  7.5* 7.3* 7.6* 8.5* 8.2*  HCT 18.8* 23.4* 25.3*  25.6* 25.0* 25.6* 27.6* 27.9*  MCV 70.9* 73.4* 74.2*  --  73.8* 75.2* 75.4*  PLT 233 197 195  --  178 187 204   Cardiac Enzymes:  Recent Labs Lab 05/18/15 0750 05/18/15 1951  TROPONINI 5.24* 6.98*   BNP (last 3 results)  Recent Labs  05/17/15 2141  BNP 168.3*    ProBNP (last 3 results) No results for input(s): PROBNP in the last 8760 hours.  CBG:  Recent Labs Lab 05/20/15 1635 05/20/15 2137 05/21/15 0848 05/21/15 1117 05/21/15 1626  GLUCAP 111* 169* 152* 193* 111*    Recent Results (from the past 240 hour(s))  Blood Culture (routine x 2)     Status: None (Preliminary result)   Collection Time: 05/17/15  8:55 PM  Result Value Ref Range Status   Specimen Description BLOOD LEFT ANTECUBITAL  Final   Special Requests IN PEDIATRIC BOTTLE 2CC  Final   Culture NO GROWTH 4 DAYS  Final   Report Status PENDING  Incomplete  Blood Culture (routine x 2)     Status: None (Preliminary result)   Collection Time: 05/17/15  9:00 PM  Result Value Ref Range Status   Specimen Description BLOOD RIGHT FOREARM  Final   Special Requests BOTTLES DRAWN AEROBIC AND ANAEROBIC 5CC  Final   Culture NO GROWTH 4 DAYS  Final   Report Status PENDING  Incomplete  Urine culture     Status: Abnormal   Collection Time: 05/17/15 10:22 PM  Result Value Ref Range Status   Specimen Description URINE, CATHETERIZED  Final   Special Requests NONE  Final   Culture >=100,000 COLONIES/mL ESCHERICHIA COLI (A)  Final   Report Status 05/20/2015 FINAL  Final   Organism ID, Bacteria ESCHERICHIA COLI (A)  Final      Susceptibility   Escherichia coli - MIC*    AMPICILLIN >=32 RESISTANT Resistant     CEFAZOLIN <=4 SENSITIVE Sensitive     CEFTRIAXONE <=1 SENSITIVE Sensitive     CIPROFLOXACIN <=0.25 SENSITIVE Sensitive     GENTAMICIN <=1 SENSITIVE Sensitive      IMIPENEM <=0.25 SENSITIVE Sensitive     NITROFURANTOIN <=16 SENSITIVE Sensitive     TRIMETH/SULFA <=20 SENSITIVE Sensitive     AMPICILLIN/SULBACTAM 16 INTERMEDIATE Intermediate     PIP/TAZO <=4 SENSITIVE Sensitive     * >=100,000 COLONIES/mL ESCHERICHIA COLI  MRSA PCR Screening     Status: None   Collection Time: 05/18/15  2:05 AM  Result Value Ref Range Status   MRSA by PCR NEGATIVE NEGATIVE Final    Comment:        The GeneXpert MRSA Assay (FDA approved for NASAL specimens only), is one component of a comprehensive MRSA colonization surveillance program. It is not intended to diagnose MRSA infection nor to guide or monitor treatment for MRSA infections.   Culture, blood (routine x 2)     Status: None (  Preliminary result)   Collection Time: 05/18/15  8:58 AM  Result Value Ref Range Status   Specimen Description BLOOD RIGHT HAND  Final   Special Requests IN PEDIATRIC BOTTLE  3CC  Final   Culture NO GROWTH 3 DAYS  Final   Report Status PENDING  Incomplete  Culture, blood (routine x 2)     Status: None (Preliminary result)   Collection Time: 05/18/15  9:02 AM  Result Value Ref Range Status   Specimen Description BLOOD RIGHT ARM  Final   Special Requests IN PEDIATRIC BOTTLE 3CC  Final   Culture NO GROWTH 3 DAYS  Final   Report Status PENDING  Incomplete     Studies:  Recent x-ray studies have been reviewed in detail by the Attending Physician  Scheduled Meds:  Scheduled Meds: . sodium chloride  10 mL/hr Intravenous Once  . antiseptic oral rinse  7 mL Mouth Rinse BID  . atorvastatin  80 mg Oral q1800  . cefTRIAXone (ROCEPHIN)  IV  1 g Intravenous Q24H  . clonazePAM  0.5 mg Oral QHS  . ferumoxytol  510 mg Intravenous Weekly  . insulin aspart  0-5 Units Subcutaneous QHS  . insulin aspart  0-9 Units Subcutaneous TID WC  . metoprolol tartrate  12.5 mg Oral BID  . pantoprazole  40 mg Oral BID  . sodium chloride flush  3 mL Intravenous Q12H  . sodium chloride flush   3 mL Intravenous Q12H    Time spent on care of this patient: 40 mins   WOODS, Roselind Messier , MD  [facility] Office  [phone] Pager - [phone]  On-Call/Text Page:      [email or web address]      password [email or web address]  If 7PM-7AM, please contact night-coverage [email or web address] Password [email or web address] 05/21/2015, 6:05 PM   LOS: 4 days   Care during the described time interval was provided by me .  I have reviewed this patient's available data, including medical history, events of note, physical examination, and all test results as part of my evaluation. I have personally reviewed and interpreted all radiology studies.   Carolyne Littles, MD [phone] Pager

## 2015-05-21 NOTE — Progress Notes (Signed)
DAILY PROGRESS NOTE  Subjective:  No events overnight. 2 AVM's cauterized yesterday by GI. H/H has been stable. Renal function appears back to normal. LVEF 55-60%.  Objective:  Temp:  [97.9 F (36.6 C)-98.6 F (37 C)] 98.4 F (36.9 C) (04/14 0848) Pulse Rate:  [67-81] 81 (04/14 0848) Resp:  [17-37] 28 (04/14 0848) BP: (104-176)/(45-86) 145/59 mmHg (04/14 0848) SpO2:  [96 %-100 %] 98 % (04/14 0848) Weight change:   Intake/Output from previous day: 04/13 0701 - 04/14 0700 In: 950 [P.O.:600; I.V.:300; IV Piggyback:50] Out: 179 [Urine:953]  Intake/Output from this shift: Total I/O In: 240 [P.O.:240] Out: 75 [Urine:75]  Medications: Current Facility-Administered Medications  Medication Dose Route Frequency Provider Last Rate Last Dose  . 0.9 %  sodium chloride infusion  10 mL/hr Intravenous Once Leo Grosser, MD   10 mL/hr at 05/18/15 0101  . acetaminophen (TYLENOL) tablet 650 mg  650 mg Oral Q6H PRN Norval Morton, MD   650 mg at 05/19/15 1505  . albuterol (PROVENTIL) (2.5 MG/3ML) 0.083% nebulizer solution 2.5 mg  2.5 mg Nebulization Q2H PRN Norval Morton, MD   2.5 mg at 05/19/15 0011  . antiseptic oral rinse (CPC / CETYLPYRIDINIUM CHLORIDE 0.05%) solution 7 mL  7 mL Mouth Rinse BID Norval Morton, MD   7 mL at 05/21/15 0943  . atorvastatin (LIPITOR) tablet 80 mg  80 mg Oral q1800 Pixie Casino, MD   80 mg at 05/20/15 1807  . cefTRIAXone (ROCEPHIN) 1 g in dextrose 5 % 50 mL IVPB  1 g Intravenous Q24H Franky Macho, RPH   1 g at 05/20/15 2211  . clonazePAM (KLONOPIN) tablet 0.5 mg  0.5 mg Oral QHS Thurnell Lose, MD   0.5 mg at 05/20/15 2200  . diphenhydrAMINE (BENADRYL) injection 25 mg  25 mg Intravenous Q6H PRN Thurnell Lose, MD      . fentaNYL (SUBLIMAZE) injection 25-50 mcg  25-50 mcg Intravenous Q5 min PRN Roberts Gaudy, MD      . ferumoxytol [facility]) 510 mg in sodium chloride 0.9 % 100 mL IVPB  510 mg Intravenous Weekly Thurnell Lose, MD   510 mg at  05/18/15 1209  . furosemide (LASIX) injection 40 mg  40 mg Intravenous BID Pixie Casino, MD   40 mg at 05/21/15 0940  . insulin aspart (novoLOG) injection 0-5 Units  0-5 Units Subcutaneous QHS Norval Morton, MD   3 Units at 05/19/15 2211  . insulin aspart (novoLOG) injection 0-9 Units  0-9 Units Subcutaneous TID WC Norval Morton, MD   2 Units at 05/21/15 [phone]  . metoprolol tartrate (LOPRESSOR) tablet 12.5 mg  12.5 mg Oral BID Thurnell Lose, MD   12.5 mg at 05/21/15 0949  . morphine 2 MG/ML injection 2 mg  2 mg Intravenous Q2H PRN Norval Morton, MD      . ondansetron (ZOFRAN) injection 4 mg  4 mg Intravenous Q6H PRN Norval Morton, MD      . ondansetron (ZOFRAN) injection 4 mg  4 mg Intravenous Once PRN Roberts Gaudy, MD      . pantoprazole (PROTONIX) EC tablet 40 mg  40 mg Oral BID Vena Rua, PA-C   40 mg at 05/21/15 0940  . sodium chloride flush (NS) 0.9 % injection 3 mL  3 mL Intravenous Q12H Norval Morton, MD   3 mL at 05/21/15 0950    Physical Exam: General appearance: alert and no distress Neck:  JVD - 3 cm above sternal notch and no carotid bruit Lungs: diminished breath sounds bibasilar Heart: regular rate and rhythm, S1, S2 normal and systolic murmur: midsystolic 3/6, crescendo at 2nd left intercostal space Abdomen: soft, non-tender; bowel sounds normal; no masses,  no organomegaly and obese Extremities: extremities normal, atraumatic, no cyanosis or edema Pulses: 2+ and symmetric Skin: Pale, cool, dry Neurologic: Grossly normal Psych: Pleasant  Lab Results: Results for orders placed or performed during the hospital encounter of 05/17/15 (from the past 48 hour(s))  Glucose, capillary     Status: Abnormal   Collection Time: 05/19/15 11:05 AM  Result Value Ref Range   Glucose-Capillary 163 (H) 65 - 99 mg/dL  Glucose, capillary     Status: Abnormal   Collection Time: 05/19/15  4:14 PM  Result Value Ref Range   Glucose-Capillary 131 (H) 65 - 99 mg/dL   Comment  1 Notify RN   CBC with Differential/Platelet     Status: Abnormal   Collection Time: 05/19/15  5:30 PM  Result Value Ref Range   WBC 9.5 4.0 - 10.5 K/uL   RBC 3.67 (L) 3.87 - 5.11 MIL/uL   Hemoglobin 8.5 (L) 12.0 - 15.0 g/dL   HCT 27.6 (L) 36.0 - 46.0 %   MCV 75.2 (L) 78.0 - 100.0 fL   MCH 23.2 (L) 26.0 - 34.0 pg   MCHC 30.8 30.0 - 36.0 g/dL   RDW 18.2 (H) 11.5 - 15.5 %   Platelets 187 150 - 400 K/uL   Neutrophils Relative % 78 %   Neutro Abs 7.4 1.7 - 7.7 K/uL   Lymphocytes Relative 11 %   Lymphs Abs 1.0 0.7 - 4.0 K/uL   Monocytes Relative 10 %   Monocytes Absolute 1.0 0.1 - 1.0 K/uL   Eosinophils Relative 1 %   Eosinophils Absolute 0.1 0.0 - 0.7 K/uL   Basophils Relative 0 %   Basophils Absolute 0.0 0.0 - 0.1 K/uL  Glucose, capillary     Status: Abnormal   Collection Time: 05/19/15  9:26 PM  Result Value Ref Range   Glucose-Capillary 280 (H) 65 - 99 mg/dL   Comment 1 Notify RN   CBC     Status: Abnormal   Collection Time: 05/20/15  3:04 AM  Result Value Ref Range   WBC 8.8 4.0 - 10.5 K/uL   RBC 3.70 (L) 3.87 - 5.11 MIL/uL   Hemoglobin 8.2 (L) 12.0 - 15.0 g/dL   HCT 27.9 (L) 36.0 - 46.0 %   MCV 75.4 (L) 78.0 - 100.0 fL   MCH 22.2 (L) 26.0 - 34.0 pg   MCHC 29.4 (L) 30.0 - 36.0 g/dL   RDW 18.5 (H) 11.5 - 15.5 %   Platelets 204 150 - 400 K/uL  Basic metabolic panel     Status: Abnormal   Collection Time: 05/20/15  3:04 AM  Result Value Ref Range   Sodium 132 (L) 135 - 145 mmol/L   Potassium 3.6 3.5 - 5.1 mmol/L   Chloride 99 (L) 101 - 111 mmol/L   CO2 24 22 - 32 mmol/L   Glucose, Bld 114 (H) 65 - 99 mg/dL   BUN 16 6 - 20 mg/dL   Creatinine, Ser 0.86 0.44 - 1.00 mg/dL   Calcium 9.1 8.9 - 10.3 mg/dL   GFR calc non Af Amer >60 >60 mL/min   GFR calc Af Amer >60 >60 mL/min    Comment: (NOTE) The eGFR has been calculated using the CKD EPI equation. This  calculation has not been validated in all clinical situations. eGFR's persistently <60 mL/min signify possible  Chronic Kidney Disease.    Anion gap 9 5 - 15  Glucose, capillary     Status: Abnormal   Collection Time: 05/20/15  7:51 AM  Result Value Ref Range   Glucose-Capillary 129 (H) 65 - 99 mg/dL   Comment 1 Capillary Specimen   Glucose, capillary     Status: Abnormal   Collection Time: 05/20/15  4:35 PM  Result Value Ref Range   Glucose-Capillary 111 (H) 65 - 99 mg/dL   Comment 1 Capillary Specimen   Glucose, capillary     Status: Abnormal   Collection Time: 05/20/15  9:37 PM  Result Value Ref Range   Glucose-Capillary 169 (H) 65 - 99 mg/dL   Comment 1 Capillary Specimen   Glucose, capillary     Status: Abnormal   Collection Time: 05/21/15  8:48 AM  Result Value Ref Range   Glucose-Capillary 152 (H) 65 - 99 mg/dL    Imaging: No results found.  Assessment:  Principal Problem:   Sepsis due to urinary tract infection ([place]) Active Problems:   Essential hypertension   Symptomatic anemia   Chest pain   Sinus tachycardia (H[facility])   Acute kidney injury ([place])   Demand ischemia of myocardium (HCC)   Acute diastolic (congestive) heart failure (HCC)   Gastric ulceration   AVM (arteriovenous malformation) of colon   Plan:  Discussed definitive LHC with her, which I would proceed with later today with Dr. Ellyn Hack. Please keep NPO for procedure. We discussed risks/benefits/alternatives to the procedure and she is agreeable to proceed.  Time Spent Directly with Patient:  15 minutes  Length of Stay:  LOS: 4 days   Pixie Casino, MD, [facility] Attending Cardiologist [place] 05/21/2015, 11:04 AM

## 2015-05-21 NOTE — Progress Notes (Signed)
          Daily Rounding Note  05/21/2015, 8:14 AM  LOS: 4 days   SUBJECTIVE:       Feels well.  No BMs.    OBJECTIVE:         Vital signs in last 24 hours:    Temp:  [97.9 F (36.6 C)-98.6 F (37 C)] 98.4 F (36.9 C) (04/14 0431) Pulse Rate:  [67-81] 76 (04/14 0431) Resp:  [17-37] 37 (04/14 0431) BP: (104-176)/(45-86) 159/82 mmHg (04/14 0431) SpO2:  [96 %-100 %] 98 % (04/14 0431) Last BM Date: 05/20/15 Filed Weights   05/18/15 0044  Weight: 104.645 kg (230 lb 11.2 oz)   General: pleasant, looks chronically ill but comfortable   Heart: RRR in 80s Chest: clear bil.  No labored breathing Abdomen: obese, soft, NT, ND  Extremities: no CCE Neuro/Psych:  Oriented x3.  Moves all 4.  Very engaged and thoughtful.    Intake/Output from previous day: 04/13 0701 - 04/14 0700 In: 950 [P.O.:600; I.V.:300; IV Piggyback:50] Out: 953 [Urine:953]  Intake/Output this shift:    Lab Results:  Recent Labs  05/19/15 0517 05/19/15 1730 05/20/15 0304  WBC 11.8* 9.5 8.8  HGB 7.6* 8.5* 8.2*  HCT 25.6* 27.6* 27.9*  PLT 178 187 204   BMET  Recent Labs  05/19/15 0254 05/20/15 0304  NA 132* 132*  K 3.6 3.6  CL 100* 99*  CO2 21* 24  GLUCOSE 112* 114*  BUN 25* 16  CREATININE 1.19* 0.86  CALCIUM 8.8* 9.1   LFT No results for input(s): PROT, ALBUMIN, AST, ALT, ALKPHOS, BILITOT, BILIDIR, IBILI in the last 72 hours. PT/INR No results for input(s): LABPROT, INR in the last 72 hours. Hepatitis Panel No results for input(s): HEPBSAG, HCVAB, HEPAIGM, HEPBIGM in the last 72 hours.  Studies/Results: No results found.  ASSESMENT:   *  IDA, chronic.  Intolerant of po iron.  S/p 3 prbcs.  hgb overall improved from 5 and stable.  S/p Feraheme infusion 4/11, to be given weekly through 4/25.     EGD: gastric ulcer, gastritis Colonoscopy: APC of non-bleeding AVMs, non-bleeding int rrhoids Likely bleeding and anemia due to combo of  ulcers and AVMs.    *   Elevated troponins.  Felt due to demand ischemia.    PLAN   *  Protonix or other PPI BID, was taking Omeprazole 20 BID PTA.     *  If gastric biopsy + for H Pylori will add abx.    *  Will sign off.  Ok to proceed to cath and add plavix, coumadin, noac, DAP, etc.  If recurrent blood loss anemia, will need enteroscopy and likely pill endoscopy.       Jennye Moccasin  05/21/2015, 8:14 AM Pager: [phone]

## 2015-05-22 DIAGNOSIS — I209 Angina pectoris, unspecified: Secondary | ICD-10-CM | POA: Diagnosis present

## 2015-05-22 DIAGNOSIS — I5032 Chronic diastolic (congestive) heart failure: Secondary | ICD-10-CM | POA: Diagnosis present

## 2015-05-22 DIAGNOSIS — K922 Gastrointestinal hemorrhage, unspecified: Secondary | ICD-10-CM | POA: Diagnosis present

## 2015-05-22 DIAGNOSIS — I272 Pulmonary hypertension, unspecified: Secondary | ICD-10-CM | POA: Diagnosis present

## 2015-05-22 DIAGNOSIS — D509 Iron deficiency anemia, unspecified: Secondary | ICD-10-CM | POA: Diagnosis present

## 2015-05-22 LAB — CBC
HEMATOCRIT: 29.8 % — AB (ref 36.0–46.0)
HEMOGLOBIN: 8.4 g/dL — AB (ref 12.0–15.0)
MCH: 22.2 pg — ABNORMAL LOW (ref 26.0–34.0)
MCHC: 28.2 g/dL — ABNORMAL LOW (ref 30.0–36.0)
MCV: 78.8 fL (ref 78.0–100.0)
PLATELETS: 200 10*3/uL (ref 150–400)
RBC: 3.78 MIL/uL — AB (ref 3.87–5.11)
RDW: 21 % — ABNORMAL HIGH (ref 11.5–15.5)
WBC: 5.2 10*3/uL (ref 4.0–10.5)

## 2015-05-22 LAB — MAGNESIUM
MAGNESIUM: 1.7 mg/dL (ref 1.7–2.4)
Magnesium: 1.7 mg/dL (ref 1.7–2.4)

## 2015-05-22 LAB — LIPID PANEL
CHOL/HDL RATIO: 4.9 ratio
CHOLESTEROL: 64 mg/dL (ref 0–200)
HDL: 13 mg/dL — AB (ref >40)
LDL Cholesterol: 34 mg/dL (ref 0–99)
TRIGLYCERIDES: 86 mg/dL (ref <150)
VLDL: 17 mg/dL (ref 0–40)

## 2015-05-22 LAB — CULTURE, BLOOD (ROUTINE X 2)
CULTURE: NO GROWTH
Culture: NO GROWTH

## 2015-05-22 LAB — BASIC METABOLIC PANEL
ANION GAP: 11 (ref 5–15)
BUN: 12 mg/dL (ref 6–20)
CHLORIDE: 100 mmol/L — AB (ref 101–111)
CO2: 30 mmol/L (ref 22–32)
Calcium: 9.1 mg/dL (ref 8.9–10.3)
Creatinine, Ser: 1.02 mg/dL — ABNORMAL HIGH (ref 0.44–1.00)
GFR, EST NON AFRICAN AMERICAN: 52 mL/min — AB (ref >60)
Glucose, Bld: 111 mg/dL — ABNORMAL HIGH (ref 65–99)
POTASSIUM: 3.2 mmol/L — AB (ref 3.5–5.1)
SODIUM: 141 mmol/L (ref 135–145)

## 2015-05-22 LAB — GLUCOSE, CAPILLARY
GLUCOSE-CAPILLARY: 153 mg/dL — AB (ref 65–99)
GLUCOSE-CAPILLARY: 181 mg/dL — AB (ref 65–99)
Glucose-Capillary: 169 mg/dL — ABNORMAL HIGH (ref 65–99)

## 2015-05-22 MED ORDER — VITAMIN C 500 MG PO TABS
500.0000 mg | ORAL_TABLET | Freq: Two times a day (BID) | ORAL | Status: DC
Start: 2015-05-22 — End: 2015-05-23
  Administered 2015-05-23 (×2): 500 mg via ORAL
  Filled 2015-05-22 (×2): qty 1

## 2015-05-22 MED ORDER — POTASSIUM CHLORIDE CRYS ER 20 MEQ PO TBCR
40.0000 meq | EXTENDED_RELEASE_TABLET | Freq: Once | ORAL | Status: AC
  Administered 2015-05-22: 40 meq via ORAL
  Filled 2015-05-22: qty 2

## 2015-05-22 MED ORDER — MAGNESIUM SULFATE 50 % IJ SOLN
3.0000 g | Freq: Once | INTRAMUSCULAR | Status: AC
  Administered 2015-05-22: 3 g via INTRAVENOUS
  Filled 2015-05-22 (×2): qty 6

## 2015-05-22 NOTE — Progress Notes (Signed)
[place] TEAM 1 - Stepdown/ICU TEAM Progress Note  Mary Mccarthy FXO:[id number] DOB: Nov 21, 1940 DOA: 05/17/2015 PCP: [name], MD  Admit HPI / Brief Narrative: This Menze is a 75 year old WF PMHx Depression PUD, Colonic Polyps, HTN, Heart Murmur, HLD, DM type 2, GERD,;   who presents with multiple complaints. Patient had reportedly had a syncopal event at home she fell upon waking complained of shortness of breath and chest pain. She notes for the last 2 months at least she's had progressively worsening shortness of breath. Reports difficulty walking more than 200 feet without being extremely exhausted. However, symptoms appeared to worsen today where she reports intermittent subjective fever and chills all day. She was unable to move all to count due to extreme fatigue. Had associated symptoms of which she reports is intermittent "flip flopping" of her heart as she attributed to an irregular heartbeat. Within the last few days she has noted increased urinary frequency at night. Order which she does not usually have. Notes multiple recent sick contacts has many members of her family have been sick with pneumonia and flu. She herself has been doing a lot of coughing and complaints of backaches and left shoulder blade pain She notes a known history of iron deficiency anemia, but was unable to take iron supplementation due to reports of diarrhea. Patient denies any black stools, abdominal pain, diarrhea, or constipation.  EMS had been called by the patient's family. In route because of chest pain complaints patient had been given 325 mg of aspirin. On arrival heart rates were noted to be up to 128, temperature 102.68F, respirations 36, blood pressure 103/50, and O2 saturations 97%. Initial lab work revealed WBC 12.5, hemoglobin 5, CO2 20, BUN 21 creatinine 1.23, glucose 173, and lactic acid 3.5. UA was positive for many bacteria. Code sepsis was called as the patient was type and screen, ordered to be  transfused 2 units of PRBCs, and started on Rocephin.     HPI/Subjective: 4/15 A/O 4, NAD, negative CP, negative SOB, negative N/V, patient still feels very fatigued has not ambulated around ward  Assessment/Plan: Sepsis secondary to a urinary tract infectio positive Escherichia coli: - Completed antibiotics on 4/15  Acute kidney injury:(baseline creatinine is 0.8),  -Multifactorial to include severe anemia, and UTI  -On admission creatinine elevated at 1.23 with a BUN of 21.  - Still slightly elevated but resolving  Symptomatic Anemia with reported Syncopal and Fall:  -Hemoglobin 5 on admission patient with guaiac-negative stools.  - T&S and ordered to be transfused 2 units of blood  - Serial H&H stable -Ambulatory SPO2  Acute GI bleed -EGD and colonoscopy showed several areas of potential GI bleed but no current bleeding see results below -Protonix 40 mg BID  Iron deficiency Anemia - On iron replacement -Start vitamin C 500 mg BID  Leukocytosis: - See sepsis  Elevated Troponin: -Trops have increased from 0.37>>5.24, given the patients report over worsening symptoms over the past 2 months, EKG changes and episode of chest pain along with extreme weakness yesterday, would consider a cath -Pt needs to be worked up from GI stand point to determine treatment, considering she has receiving 2 units of PRBC and hgb remains at 7, prior to cardiology intervention and antiplatelet therapy -Will discuss with Dr. Eldridge Dace regarding treatment plan -Previous Echo 11/28/2013 EF 65-70%, NWMA, mild AS. Echo from today is pending  Chest pain/MI - Troponins 3 positive -Cardiac catheterization negative for occlusion causing MI. MI most likely secondary to patient's severe  anemia. -See cardiac catheterization findings below  -Cardiology has signed off  Chronic diastolic CHF/Pulmonary Hypertension  Sinus tachycardia  -Most likely secondary to anemia and sepsis  - Continue to  monitor  Essential Hypertension  - Hold hydrochlorothiazide secondary to AKI  Diabetes mellitus type 2 - Held metformin and Amaryl - Hypoglycemic protocols -CBGs every before meals and at bedtime with sensitive sliding scale of insulin -Hemoglobin A1c pending -Lipid panel pending   Hypokalemia -Potassium goal> 4 -K-Dur 40 mEq    Code Status: FULL Family Communication: no family present at time of exam Disposition Plan: Per cardiology and GI    Consultants: Dr.Jayadeep S Varanasi cardiology Dr.Steven Maylon [facility]     Procedure/Significant Events: Transfused 2 units of PRBC 4/11 echocardiogram- Left ventricle: moderate LVH.-LVEF=  55%- 60%. -(grade 2 diastolic dysfunction).  - Aortic valve:Marland Kitchen Moderate stenosis. - Left atrium: Severely dilated at 52 ml/m2. - Right atrium: The atrium was at the upper limits of normal- Tricuspid valve: moderate regurgitation. - Pulmonary arteries: Dilated. PA peak pressure: 51 mm Hg (S). 4/12 EGD; - Non-bleeding linear gastric ulcers - Gastritis. Biopsied. -gastritis and gastric ulcers could have caused anemia,  4/12 Colonoscopy;  - Two non-bleeding colonic angiodysplastic lesions. Tx Argon Plasma Coagulation (APC).- Non-bleeding internal hemorrhoids. -Overall, AVMs could have contributed to anemia,  4/12 left heart catheterization;Ost Ramus lesion, 60% stenosed.-Ost RCA lesion, 30% stenosed.-Dist LAD lesion, tapers to a small vessel with diffuse roughly 40-50% stenosis. -No culprit lesion noted to explain the patient's elevated troponin. Most significant lesion in the ostial ramus intermedius. This is not PCI target    Culture 4/10 blood right forearm/left AC NGTD 4/10 urine positive Escherichia coli 4/11 MRSA by PCR negative 4/11 blood right hand/arm NGTD   Antibiotics: Rocephin 4/10>> 4/15   DVT prophylaxis: SCD   Devices    LINES / TUBES:      Continuous Infusions:    Objective: VITAL SIGNS: Temp: 98.3  F (36.8 C) (04/15 1600) Temp Source: Oral (04/15 1100) BP: 147/54 mmHg (04/15 1600) Pulse Rate: 75 (04/15 1600) SPO2; FIO2:   Intake/Output Summary (Last 24 hours) at 05/22/15 1918 Last data filed at 05/22/15 0930  Gross per 24 hour  Intake     50 ml  Output    250 ml  Net   -200 ml     Exam: General: A/O 4, NAD, No acute respiratory distress Eyes: negative scleral hemorrhage ENT: Negative Runny nose, negative gingival bleeding, Neck:  Negative scars, masses, torticollis, lymphadenopathy, JVD Lungs: Clear to auscultation bilaterally without wheezes or crackles Cardiovascular: Regular rate and rhythm positive systolic murmur (chronic) grade 2/5, negative gallop or rub normal S1 and S2 Abdomen:negative abdominal pain, nondistended, positive soft, bowel sounds, no rebound, no ascites, no appreciable mass Extremities: No significant cyanosis, clubbing, or edema bilateral lower extremities Psychiatric:  Negative depression, negative anxiety, negative fatigue, negative mania  Neurologic:  Cranial nerves II through XII intact, tongue/uvula midline, all extremities muscle strength 5/5, sensation intact throughout, negative dysarthria, negative expressive aphasia, negative receptive aphasia.   Data Reviewed: Basic Metabolic Panel:  Recent Labs Lab 05/17/15 2100 05/18/15 0748 05/19/15 0254 05/20/15 0304 05/22/15 0240 05/22/15 1100  NA 135 136 132* 132* 141  --   K 3.6 4.0 3.6 3.6 3.2*  --   CL 103 106 100* 99* 100*  --   CO2 20* 19* 21* 24 30  --   GLUCOSE 173* 154* 112* 114* 111*  --   BUN 21* 22* 25* 16 12  --  CREATININE 1.23* 1.16* 1.19* 0.86 1.02*  --   CALCIUM 9.2 8.7* 8.8* 9.1 9.1  --   MG  --   --   --   --  1.7 1.7   Liver Function Tests:  Recent Labs Lab 05/17/15 2100 05/18/15 0748  AST 25 46*  ALT 13* 19  ALKPHOS 60 57  BILITOT 1.2 2.9*  PROT 6.3* 6.3*  ALBUMIN 3.1* 2.9*   No results for input(s): LIPASE, AMYLASE in the last 168 hours. No results  for input(s): AMMONIA in the last 168 hours. CBC:  Recent Labs Lab 05/17/15 2100  05/18/15 1951 05/18/15 2335 05/19/15 0517 05/19/15 1730 05/20/15 0304 05/22/15 1100  WBC 12.5*  < > 12.5*  --  11.8* 9.5 8.8 5.2  NEUTROABS 11.4*  --   --   --   --  7.4  --   --   HGB 5.0*  < > 7.4*  7.5* 7.3* 7.6* 8.5* 8.2* 8.4*  HCT 18.8*  < > 25.3*  25.6* 25.0* 25.6* 27.6* 27.9* 29.8*  MCV 70.9*  < > 74.2*  --  73.8* 75.2* 75.4* 78.8  PLT 233  < > 195  --  178 187 204 200  < > = values in this interval not displayed. Cardiac Enzymes:  Recent Labs Lab 05/18/15 0750 05/18/15 1951  TROPONINI 5.24* 6.98*   BNP (last 3 results)  Recent Labs  05/17/15 2141  BNP 168.3*    ProBNP (last 3 results) No results for input(s): PROBNP in the last 8760 hours.  CBG:  Recent Labs Lab 05/21/15 1626 05/21/15 2123 05/22/15 0815 05/22/15 1149 05/22/15 1614  GLUCAP 111* 224* 153* 169* 181*    Recent Results (from the past 240 hour(s))  Blood Culture (routine x 2)     Status: None   Collection Time: 05/17/15  8:55 PM  Result Value Ref Range Status   Specimen Description BLOOD LEFT ANTECUBITAL  Final   Special Requests IN PEDIATRIC BOTTLE 2CC  Final   Culture NO GROWTH 5 DAYS  Final   Report Status 05/22/2015 FINAL  Final  Blood Culture (routine x 2)     Status: None   Collection Time: 05/17/15  9:00 PM  Result Value Ref Range Status   Specimen Description BLOOD RIGHT FOREARM  Final   Special Requests BOTTLES DRAWN AEROBIC AND ANAEROBIC 5CC  Final   Culture NO GROWTH 5 DAYS  Final   Report Status 05/22/2015 FINAL  Final  Urine culture     Status: Abnormal   Collection Time: 05/17/15 10:22 PM  Result Value Ref Range Status   Specimen Description URINE, CATHETERIZED  Final   Special Requests NONE  Final   Culture >=100,000 COLONIES/mL ESCHERICHIA COLI (A)  Final   Report Status 05/20/2015 FINAL  Final   Organism ID, Bacteria ESCHERICHIA COLI (A)  Final      Susceptibility    Escherichia coli - MIC*    AMPICILLIN >=32 RESISTANT Resistant     CEFAZOLIN <=4 SENSITIVE Sensitive     CEFTRIAXONE <=1 SENSITIVE Sensitive     CIPROFLOXACIN <=0.25 SENSITIVE Sensitive     GENTAMICIN <=1 SENSITIVE Sensitive     IMIPENEM <=0.25 SENSITIVE Sensitive     NITROFURANTOIN <=16 SENSITIVE Sensitive     TRIMETH/SULFA <=20 SENSITIVE Sensitive     AMPICILLIN/SULBACTAM 16 INTERMEDIATE Intermediate     PIP/TAZO <=4 SENSITIVE Sensitive     * >=100,000 COLONIES/mL ESCHERICHIA COLI  MRSA PCR Screening     Status: None  Collection Time: 05/18/15  2:05 AM  Result Value Ref Range Status   MRSA by PCR NEGATIVE NEGATIVE Final    Comment:        The GeneXpert MRSA Assay (FDA approved for NASAL specimens only), is one component of a comprehensive MRSA colonization surveillance program. It is not intended to diagnose MRSA infection nor to guide or monitor treatment for MRSA infections.   Culture, blood (routine x 2)     Status: None (Preliminary result)   Collection Time: 05/18/15  8:58 AM  Result Value Ref Range Status   Specimen Description BLOOD RIGHT HAND  Final   Special Requests IN PEDIATRIC BOTTLE  3CC  Final   Culture NO GROWTH 4 DAYS  Final   Report Status PENDING  Incomplete  Culture, blood (routine x 2)     Status: None (Preliminary result)   Collection Time: 05/18/15  9:02 AM  Result Value Ref Range Status   Specimen Description BLOOD RIGHT ARM  Final   Special Requests IN PEDIATRIC BOTTLE 3CC  Final   Culture NO GROWTH 4 DAYS  Final   Report Status PENDING  Incomplete     Studies:  Recent x-ray studies have been reviewed in detail by the Attending Physician  Scheduled Meds:  Scheduled Meds: . sodium chloride  10 mL/hr Intravenous Once  . antiseptic oral rinse  7 mL Mouth Rinse BID  . atorvastatin  80 mg Oral q1800  . cefTRIAXone (ROCEPHIN)  IV  1 g Intravenous Q24H  . clonazePAM  0.5 mg Oral QHS  . ferumoxytol  510 mg Intravenous Weekly  . insulin aspart   0-5 Units Subcutaneous QHS  . insulin aspart  0-9 Units Subcutaneous TID WC  . magnesium sulfate 1 - 4 g bolus IVPB  3 g Intravenous Once  . metoprolol tartrate  12.5 mg Oral BID  . pantoprazole  40 mg Oral BID  . sodium chloride flush  3 mL Intravenous Q12H  . sodium chloride flush  3 mL Intravenous Q12H  . vitamin C  500 mg Oral BID    Time spent on care of this patient: 40 mins   Tong Pieczynski, Roselind Messier , MD  [facility] Office  [phone] Pager - [phone]  On-Call/Text Page:      [email or web address]      password [email or web address]  If 7PM-7AM, please contact night-coverage [email or web address] Password [email or web address] 05/22/2015, 7:18 PM   LOS: 5 days   Care during the described time interval was provided by me .  I have reviewed this patient's available data, including medical history, events of note, physical examination, and all test results as part of my evaluation. I have personally reviewed and interpreted all radiology studies.   Carolyne Littles, MD [phone] Pager

## 2015-05-22 NOTE — Progress Notes (Signed)
   Cath results reviewed. No high grade CAD. EF normal.   Otherwise stable from cardiac perspective.   Would continue statin. Can add ASA when stable from a GIB perspective.   We will sign off. Please call with questions.   Bensimhon, Daniel,MD 11:02 AM

## 2015-05-23 DIAGNOSIS — A4151 Sepsis due to Escherichia coli [E. coli]: Principal | ICD-10-CM

## 2015-05-23 DIAGNOSIS — N179 Acute kidney failure, unspecified: Secondary | ICD-10-CM | POA: Diagnosis present

## 2015-05-23 LAB — CULTURE, BLOOD (ROUTINE X 2)
Culture: NO GROWTH
Culture: NO GROWTH

## 2015-05-23 LAB — BASIC METABOLIC PANEL
ANION GAP: 9 (ref 5–15)
BUN: 13 mg/dL (ref 6–20)
CALCIUM: 9.2 mg/dL (ref 8.9–10.3)
CO2: 29 mmol/L (ref 22–32)
CREATININE: 0.89 mg/dL (ref 0.44–1.00)
Chloride: 99 mmol/L — ABNORMAL LOW (ref 101–111)
GFR calc non Af Amer: >60 mL/min (ref >60)
Glucose, Bld: 145 mg/dL — ABNORMAL HIGH (ref 65–99)
Potassium: 3.9 mmol/L (ref 3.5–5.1)
Sodium: 137 mmol/L (ref 135–145)

## 2015-05-23 LAB — MAGNESIUM: Magnesium: 2.3 mg/dL (ref 1.7–2.4)

## 2015-05-23 LAB — GLUCOSE, CAPILLARY
Glucose-Capillary: 150 mg/dL — ABNORMAL HIGH (ref 65–99)
Glucose-Capillary: 127 mg/dL — ABNORMAL HIGH (ref 65–99)
Glucose-Capillary: 217 mg/dL — ABNORMAL HIGH (ref 65–99)

## 2015-05-23 MED ORDER — ATORVASTATIN CALCIUM 80 MG PO TABS: 80.0000 mg | ORAL_TABLET | Freq: Every day | ORAL | Status: DC

## 2015-05-23 MED ORDER — METOPROLOL TARTRATE 25 MG PO TABS: 12.5000 mg | ORAL_TABLET | Freq: Two times a day (BID) | ORAL | Status: DC

## 2015-05-23 MED ORDER — ASCORBIC ACID 500 MG PO TABS: 500.0000 mg | ORAL_TABLET | Freq: Two times a day (BID) | ORAL | Status: DC

## 2015-05-23 MED ORDER — PANTOPRAZOLE SODIUM 40 MG PO TBEC: 40.0000 mg | DELAYED_RELEASE_TABLET | Freq: Two times a day (BID) | ORAL | Status: DC

## 2015-05-23 NOTE — Progress Notes (Signed)
Patient stated she felt so nauseated. She was sitting in chair and she stated she needed to lie down. Zofran 4mg  given IV with relief. Patient went to sleep and slept for 6 hours. All medications given to her when she woke up about 0310. Patient stated she felt much better after napping. Will continue to monitor.

## 2015-05-23 NOTE — Progress Notes (Signed)
Patient ambulated around the entire unit. She used a walker and leaned towards the right while ambulating. Room air her sats went to 88% not sustaining. Most of the walk she was 91 to 92% on room air. She stated she felt much better after the walk. "My legs are so stiff, I'm glad to go walking." Patient assisted back to chair and call light within reach. Will continue to monitor closely.

## 2015-05-23 NOTE — Discharge Summary (Signed)
Physician Discharge Summary  [name] ONG:[id number] DOB: 1940-11-28 DOA: 05/17/2015  PCP: [name], MD  Admit date: 05/17/2015 Discharge date: 05/23/2015  Time spent: 35 minutes  Recommendations for Outpatient Follow-up:  Sepsis secondary to a urinary tract infectio positive Escherichia coli: - Completed antibiotics on 4/15  Acute renal failure unspecified acute renal failure type :(baseline creatinine is 0.8),  -Multifactorial to include severe anemia, and UTI  -On admission creatinine elevated at 1.23 with a BUN of 21.  - Resolved  Symptomatic Anemia with reported Syncopal and Fall:  -Hemoglobin 5 on admission patient with guaiac-negative stools.  - Syncopal event secondary to patient's extremely low hemoglobin. Following transfusion no further events.  -Ambulatory SPO2. Patient does not meet requirements for home O2  Acute GI bleed -EGD and colonoscopy showed several areas of potential GI bleed but no current bleeding see results below -Hold on restarting aspirin, for approximately 2 weeks to allow healing of ulcer. -Protonix 40 mg BID  Iron deficiency Anemia - Continue Iron replacement per PCP -Continue vitamin C 500 mg BID  Leukocytosis: - See sepsis  Elevated Troponin/Chest pain/MI -Trops have increased from 0.37>>5.24,--> cardiac catheterization was performed see results below  - Echo; showed chronic diastolic CHF, pulmonary hypertension; see results below  Chest pain/MI - Troponins 3 positive -Cardiac catheterization negative for occlusion causing MI. MI most likely secondary to patient's severe anemia. -See cardiac catheterization findings below  -Cardiology has signed off -Follow-up with cardiology as needed  Chronic diastolic CHF/Pulmonary Hypertension -Metoprolol 12.5 mg BID -Patient's BP controlled for a patient of this age. Would not add additional medication at this time. -PCP to monitor BP and add/titrate medication  Sinus tachycardia   -Most likely secondary to anemia and sepsis  - Resolved  Essential Hypertension  - Controlled for patient of this age group.   Diabetes mellitus type 2 - Metformin 1000 mg Am,  500 mg Pm         -Glimepiride 2 mg daily  -FSBS  before meals and at bedtime; record findings in general along with all food consumed intake to PCP appointments -Hemoglobin A1c pending  HLD -Lipid panel within ADA guidelines   -Lipitor 80 mg daily  Hypokalemia -Potassium goal> 4    Discharge Diagnoses:  Principal Problem:   Sepsis due to urinary tract infection (HCC) Active Problems:   Essential hypertension   Symptomatic anemia   Chest pain   Sinus tachycardia (HCC)   Acute kidney injury (HCC)   Demand ischemia of myocardium (HCC)   Acute diastolic (congestive) heart failure (HCC)   Gastric ulceration   AVM (arteriovenous malformation) of colon   ARF (acute renal failure) (HCC)   Chronic diastolic CHF (congestive heart failure) (HCC)   Acute GI bleeding   Pulmonary hypertension (HCC)   Iron deficiency anemia   Ischemic chest pain (HCC)   Acute renal failure (HCC)   Sepsis due to Escherichia coli (E. coli) (HCC)   Discharge Condition: Stable  Diet recommendation: Heart healthy/American diabetic Association  Filed Weights   05/18/15 0044  Weight: 104.645 kg (230 lb 11.2 oz)    History of present illness:  75 year old WF PMHx Depression PUD, Colonic Polyps, HTN, Heart Murmur, HLD, DM type 2, GERD,;   who presents with multiple complaints. Patient had reportedly had a syncopal event at home she fell upon waking complained of shortness of breath and chest pain. She notes for the last 2 months at least she's had progressively worsening shortness of breath. Reports difficulty walking more  than 200 feet without being extremely exhausted. However, symptoms appeared to worsen today where she reports intermittent subjective fever and chills all day. She was unable to move all to count due  to extreme fatigue. Had associated symptoms of which she reports is intermittent "flip flopping" of her heart as she attributed to an irregular heartbeat. Within the last few days she has noted increased urinary frequency at night. Order which she does not usually have. Notes multiple recent sick contacts has many members of her family have been sick with pneumonia and flu. She herself has been doing a lot of coughing and complaints of backaches and left shoulder blade pain She notes a known history of iron deficiency anemia, but was unable to take iron supplementation due to reports of diarrhea. Patient denies any black stools, abdominal pain, diarrhea, or constipation.  EMS had been called by the patient's family. In route because of chest pain complaints patient had been given 325 mg of aspirin. On arrival heart rates were noted to be up to 128, temperature 102.70F, respirations 36, blood pressure 103/50, and O2 saturations 97%. Initial lab work revealed WBC 12.5, hemoglobin 5, CO2 20, BUN 21 creatinine 1.23, glucose 173, and lactic acid 3.5. UA was positive for many bacteria. Code sepsis was called as the patient was type and screen, ordered to be transfused 2 units of PRBCs, and started on Rocephin. Patient was evaluated for ACS during this hospitalization. Patient was discovered to have sustained a MI secondary to demand ischemia (significantly low hemoglobin of 5). Cardiac catheterization some diffuse disease but did not find any appropriate target. Patient was treated conservatively by cardiology and released for discharge. GI performed EGD and colonoscopy and follow multiple sites that were most likely the cause of blood loss however were not acutely bleeding during procedure. In addition patient's stay was complicated by urosepsis which was appropriately treated and has resolved.    Consultants: Dr.Jayadeep Hoyle Barr cardiology Dr.Steven Maylon [facility]     Procedure/Significant  Events: Transfused 2 units of PRBC 4/11 echocardiogram- Left ventricle: moderate LVH.-LVEF= 55%- 60%. -(grade 2 diastolic dysfunction).  - Aortic valve:Marland Kitchen Moderate stenosis. - Left atrium: Severely dilated at 52 ml/m2. - Right atrium: The atrium was at the upper limits of normal- Tricuspid valve: moderate regurgitation. - Pulmonary arteries: Dilated. PA peak pressure: 51 mm Hg (S). 4/12 EGD; - Non-bleeding linear gastric ulcers - Gastritis. Biopsied. -gastritis and gastric ulcers could have caused anemia,  4/12 Colonoscopy; - Two non-bleeding colonic angiodysplastic lesions. Tx Argon Plasma Coagulation (APC).- Non-bleeding internal hemorrhoids. -Overall, AVMs could have contributed to anemia,  4/12 left heart catheterization;Ost Ramus lesion, 60% stenosed.-Ost RCA lesion, 30% stenosed.-Dist LAD lesion, tapers to a small vessel with diffuse roughly 40-50% stenosis. -No culprit lesion noted to explain the patient's elevated troponin. Most significant lesion in the ostial ramus intermedius. This is not PCI target    Culture 4/10 blood right forearm/left AC NGTD 4/10 urine positive Escherichia coli 4/11 MRSA by PCR negative 4/11 blood right hand/arm NGTD   Antibiotics: Rocephin 4/10>> 4/15    Discharge Exam: Filed Vitals:   05/23/15 0316 05/23/15 0400 05/23/15 0800 05/23/15 1200  BP: 164/59 171/60 146/72 149/52  Pulse: 82 76    Temp:   97.6 F (36.4 C) 98 F (36.7 C)  TempSrc:   Oral Oral  Resp:      Height:      Weight:      SpO2:  94%      General: A/O  4, NAD, No acute respiratory distress Eyes: negative scleral hemorrhage ENT: Negative Runny nose, negative gingival bleeding, Neck: Negative scars, masses, torticollis, lymphadenopathy, JVD Lungs: Clear to auscultation bilaterally without wheezes or crackles Cardiovascular: Regular rate and rhythm positive systolic murmur (chronic) grade 2/5, negative gallop or rub normal S1 and S2 Abdomen:negative abdominal pain,  nondistended, positive soft, bowel sounds, no rebound, no ascites, no appreciable mass    Discharge Instructions     Medication List    STOP taking these medications        aspirin 81 MG EC tablet     hydrochlorothiazide 25 MG tablet  Commonly known as:  HYDRODIURIL     irbesartan 150 MG tablet  Commonly known as:  AVAPRO     omeprazole 20 MG capsule  Commonly known as:  PRILOSEC     ondansetron 4 MG tablet  Commonly known as:  ZOFRAN     pravastatin 40 MG tablet  Commonly known as:  PRAVACHOL      TAKE these medications        acetaminophen 500 MG tablet  Commonly known as:  TYLENOL  Take 500 mg by mouth every 6 (six) hours as needed for moderate pain.     ascorbic acid 500 MG tablet  Commonly known as:  VITAMIN C  Take 1 tablet (500 mg total) by mouth 2 (two) times daily.     atorvastatin 80 MG tablet  Commonly known as:  LIPITOR  Take 1 tablet (80 mg total) by mouth daily at 6 PM.     clonazePAM 0.5 MG tablet  Commonly known as:  KLONOPIN  TAKE ONE TO TWO TABLETS BY MOUTH AT NIGHT AS NEEDED FOR SLEEP     glimepiride 2 MG tablet  Commonly known as:  AMARYL  Take 1 tablet (2 mg total) by mouth daily before breakfast.     metFORMIN 1000 MG tablet  Commonly known as:  GLUCOPHAGE  1 tab by mouth in the AM,and 1/2 tab in the PM     metoprolol tartrate 25 MG tablet  Commonly known as:  LOPRESSOR  Take 0.5 tablets (12.5 mg total) by mouth 2 (two) times daily.     pantoprazole 40 MG tablet  Commonly known as:  PROTONIX  Take 1 tablet (40 mg total) by mouth 2 (two) times daily.       Allergies  Allergen Reactions  . Ibuprofen Other (See Comments)    Bleeding events   Follow-up Information    Follow up with [name], MD. Schedule an appointment as soon as possible for a visit in 1 week.   Specialties:  Internal Medicine, Radiology   Why:  Follow-up from GI bleed, MI, chronic diastolic CHF, pulmonary hypertension, uncontrolled diabetes   Contact  information:   [address] [facility] [place] [address] [phone]        The results of significant diagnostics from this hospitalization (including imaging, microbiology, ancillary and laboratory) are listed below for reference.    Significant Diagnostic Studies: X-ray Chest Pa And Lateral  05/18/2015  CLINICAL DATA:  Sepsis, chest heaviness, shortness of breath EXAM: CHEST  2 VIEW COMPARISON:  05/17/2015 FINDINGS: Cardiomegaly again noted. Central mild vascular congestion without convincing pulmonary edema. No segmental infiltrate. Mild basilar atelectasis. Mild degenerative changes thoracic spine. IMPRESSION: Cardiomegaly. Central vascular congestion without convincing pulmonary edema. Basilar atelectasis. Mild degenerative changes thoracic spine. Electronically Signed   By: Natasha Mead M.D.   On: 05/18/2015 08:04   US  Renal  05/18/2015  CLINICAL DATA:  Acute renal failure EXAM: RENAL / URINARY TRACT ULTRASOUND COMPLETE COMPARISON:  None. FINDINGS: Right Kidney: Length: 10.6 cm. Echogenicity within normal limits. There is renal cortical thinning. No mass, perinephric fluid, or hydronephrosis visualized. No sonographically demonstrable calculus or ureterectasis. Left Kidney: Length: 11.5 cm. Echogenicity within normal limits. There is renal cortical thinning. No mass, perinephric fluid, or hydronephrosis visualized. No sonographically demonstrable calculus or ureterectasis. Bladder: Appears normal for degree of bladder distention. IMPRESSION: Renal cortical thinning, a finding that may be associated with medical renal disease. The renal echogenicity, however, is normal. Study otherwise unremarkable. In particular, no obstructing foci identified on either side. Electronically Signed   By: Bretta Bang III M.D.   On: 05/18/2015 16:31   Dg Chest Port 1 View  05/17/2015  CLINICAL DATA:  Chest pain and dyspnea.  Duration 1 day. EXAM: PORTABLE CHEST 1 VIEW COMPARISON:  05/20/2013 FINDINGS: A  single AP portable view of the chest demonstrates no focal airspace consolidation or alveolar edema. The lungs are grossly clear. There is no large effusion or pneumothorax. Cardiac and mediastinal contours appear unremarkable. IMPRESSION: No active disease. Electronically Signed   By: Ellery Plunk M.D.   On: 05/17/2015 21:17    Microbiology: Recent Results (from the past 240 hour(s))  Blood Culture (routine x 2)     Status: None   Collection Time: 05/17/15  8:55 PM  Result Value Ref Range Status   Specimen Description BLOOD LEFT ANTECUBITAL  Final   Special Requests IN PEDIATRIC BOTTLE 2CC  Final   Culture NO GROWTH 5 DAYS  Final   Report Status 05/22/2015 FINAL  Final  Blood Culture (routine x 2)     Status: None   Collection Time: 05/17/15  9:00 PM  Result Value Ref Range Status   Specimen Description BLOOD RIGHT FOREARM  Final   Special Requests BOTTLES DRAWN AEROBIC AND ANAEROBIC 5CC  Final   Culture NO GROWTH 5 DAYS  Final   Report Status 05/22/2015 FINAL  Final  Urine culture     Status: Abnormal   Collection Time: 05/17/15 10:22 PM  Result Value Ref Range Status   Specimen Description URINE, CATHETERIZED  Final   Special Requests NONE  Final   Culture >=100,000 COLONIES/mL ESCHERICHIA COLI (A)  Final   Report Status 05/20/2015 FINAL  Final   Organism ID, Bacteria ESCHERICHIA COLI (A)  Final      Susceptibility   Escherichia coli - MIC*    AMPICILLIN >=32 RESISTANT Resistant     CEFAZOLIN <=4 SENSITIVE Sensitive     CEFTRIAXONE <=1 SENSITIVE Sensitive     CIPROFLOXACIN <=0.25 SENSITIVE Sensitive     GENTAMICIN <=1 SENSITIVE Sensitive     IMIPENEM <=0.25 SENSITIVE Sensitive     NITROFURANTOIN <=16 SENSITIVE Sensitive     TRIMETH/SULFA <=20 SENSITIVE Sensitive     AMPICILLIN/SULBACTAM 16 INTERMEDIATE Intermediate     PIP/TAZO <=4 SENSITIVE Sensitive     * >=100,000 COLONIES/mL ESCHERICHIA COLI  MRSA PCR Screening     Status: None   Collection Time: 05/18/15  2:05 AM   Result Value Ref Range Status   MRSA by PCR NEGATIVE NEGATIVE Final    Comment:        The GeneXpert MRSA Assay (FDA approved for NASAL specimens only), is one component of a comprehensive MRSA colonization surveillance program. It is not intended to diagnose MRSA infection nor to guide or monitor treatment for MRSA infections.   Culture, blood (  routine x 2)     Status: None (Preliminary result)   Collection Time: 05/18/15  8:58 AM  Result Value Ref Range Status   Specimen Description BLOOD RIGHT HAND  Final   Special Requests IN PEDIATRIC BOTTLE  3CC  Final   Culture NO GROWTH 4 DAYS  Final   Report Status PENDING  Incomplete  Culture, blood (routine x 2)     Status: None (Preliminary result)   Collection Time: 05/18/15  9:02 AM  Result Value Ref Range Status   Specimen Description BLOOD RIGHT ARM  Final   Special Requests IN PEDIATRIC BOTTLE 3CC  Final   Culture NO GROWTH 4 DAYS  Final   Report Status PENDING  Incomplete     Labs: Basic Metabolic Panel:  Recent Labs Lab 05/18/15 0748 05/19/15 0254 05/20/15 0304 05/22/15 0240 05/22/15 1100 05/23/15 0909  NA 136 132* 132* 141  --  137  K 4.0 3.6 3.6 3.2*  --  3.9  CL 106 100* 99* 100*  --  99*  CO2 19* 21* 24 30  --  29  GLUCOSE 154* 112* 114* 111*  --  145*  BUN 22* 25* 16 12  --  13  CREATININE 1.16* 1.19* 0.86 1.02*  --  0.89  CALCIUM 8.7* 8.8* 9.1 9.1  --  9.2  MG  --   --   --  1.7 1.7 2.3   Liver Function Tests:  Recent Labs Lab 05/17/15 2100 05/18/15 0748  AST 25 46*  ALT 13* 19  ALKPHOS 60 57  BILITOT 1.2 2.9*  PROT 6.3* 6.3*  ALBUMIN 3.1* 2.9*   No results for input(s): LIPASE, AMYLASE in the last 168 hours. No results for input(s): AMMONIA in the last 168 hours. CBC:  Recent Labs Lab 05/17/15 2100  05/18/15 1951 05/18/15 2335 05/19/15 0517 05/19/15 1730 05/20/15 0304 05/22/15 1100  WBC 12.5*  < > 12.5*  --  11.8* 9.5 8.8 5.2  NEUTROABS 11.4*  --   --   --   --  7.4  --   --    HGB 5.0*  < > 7.4*  7.5* 7.3* 7.6* 8.5* 8.2* 8.4*  HCT 18.8*  < > 25.3*  25.6* 25.0* 25.6* 27.6* 27.9* 29.8*  MCV 70.9*  < > 74.2*  --  73.8* 75.2* 75.4* 78.8  PLT 233  < > 195  --  178 187 204 200  < > = values in this interval not displayed. Cardiac Enzymes:  Recent Labs Lab 05/18/15 0750 05/18/15 1951  TROPONINI 5.24* 6.98*   BNP: BNP (last 3 results)  Recent Labs  05/17/15 2141  BNP 168.3*    ProBNP (last 3 results) No results for input(s): PROBNP in the last 8760 hours.  CBG:  Recent Labs Lab 05/22/15 1149 05/22/15 1614 05/23/15 0331 05/23/15 0811 05/23/15 1107  GLUCAP 169* 181* 150* 127* 217*       Signed:  Carolyne Littles, MD [facility] [phone] pager

## 2015-05-23 NOTE — Progress Notes (Signed)
Reviewed d/c instructions with patient with teachback.  D/c via w/c with son at 1600.

## 2015-05-24 ENCOUNTER — Encounter (HOSPITAL_COMMUNITY): Payer: Self-pay | Admitting: Cardiology

## 2015-05-24 ENCOUNTER — Telehealth: Payer: Self-pay

## 2015-05-24 DIAGNOSIS — I2510 Atherosclerotic heart disease of native coronary artery without angina pectoris: Secondary | ICD-10-CM | POA: Insufficient documentation

## 2015-05-24 LAB — HEMOGLOBIN A1C
HEMOGLOBIN A1C: 6.2 % — AB (ref 4.8–5.6)
MEAN PLASMA GLUCOSE: 131 mg/dL

## 2015-05-24 NOTE — Telephone Encounter (Signed)
DC on 05/23/15: Admission DX Sepsis, UTI secondary, blood loss anemia, and chest pain. PCP hospital follow up appt made for 06/01/15 (son will be in town to take pt to her appt).   Transition Care Management Follow-up Telephone Call   Date discharged? 05/23/2015  How have you been since you were released from the hospital? Patient is still having SOB with fatigue and is not sure what happened that caused her to be so sick.   Do you understand why you were in the hospital? YES and NO  Do you understand the discharge instructions? YES, mostly.   Where were you discharged to? HOME  Items Reviewed:  Medications reviewed: YES  Allergies reviewed: YES  Dietary changes reviewed: YES  Referrals reviewed: YES: HH  Functional Questionnaire:   Activities of Daily Living (ADLs):   States they are independent in the following: Most ADL's. Patient is very weak/fatigued w SOB       States they require assistance with the following: Increased fatigue makes ADL's more difficult.   Any transportation issues/concerns?: Appt was made for when pt son is in town and can drive her to her appt with PCP  Any patient concerns? Not at this time, informed pt that she can contact us if there are any questions that arise.   Confirmed importance and date/time of follow-up visits scheduled YES  Provider Appointment booked with PCP (Dr. [name]) April 25th, 2017 at 3:30pm.  Confirmed with patient if condition begins to worsen call PCP or go to the ER.  Patient was given the office number and encouraged to call back with question or concerns: YES

## 2015-05-25 ENCOUNTER — Encounter (HOSPITAL_COMMUNITY): Payer: Self-pay | Admitting: Gastroenterology

## 2015-05-25 ENCOUNTER — Ambulatory Visit: Payer: Medicare Other | Admitting: Internal Medicine

## 2015-05-25 ENCOUNTER — Other Ambulatory Visit: Payer: Self-pay | Admitting: *Deleted

## 2015-05-25 DIAGNOSIS — Q2730 Arteriovenous malformation, site unspecified: Secondary | ICD-10-CM

## 2015-05-26 ENCOUNTER — Telehealth: Payer: Self-pay | Admitting: Internal Medicine

## 2015-05-26 MED ORDER — TRAMADOL HCL 50 MG PO TABS: 50.0000 mg | ORAL_TABLET | Freq: Three times a day (TID) | ORAL | Status: DC | PRN

## 2015-05-26 NOTE — Telephone Encounter (Signed)
Done hardcopy to Corinne - tramadol

## 2015-05-26 NOTE — Telephone Encounter (Signed)
Pt state that she was in the hospital for UTI, they did not give her anything when she came out and now her back is hurting her really bad. Pt was wondering if Dr. Jonny Ruiz can send in something in for her since she can not see him until 06/01/15.

## 2015-06-01 ENCOUNTER — Ambulatory Visit (INDEPENDENT_AMBULATORY_CARE_PROVIDER_SITE_OTHER): Payer: Medicare Other | Admitting: Internal Medicine

## 2015-06-01 ENCOUNTER — Encounter: Payer: Self-pay | Admitting: Internal Medicine

## 2015-06-01 ENCOUNTER — Other Ambulatory Visit (INDEPENDENT_AMBULATORY_CARE_PROVIDER_SITE_OTHER): Payer: Medicare Other

## 2015-06-01 VITALS — BP 140/80 | HR 77 | Temp 98.3°F | Resp 20 | Wt 230.0 lb

## 2015-06-01 DIAGNOSIS — D509 Iron deficiency anemia, unspecified: Secondary | ICD-10-CM

## 2015-06-01 DIAGNOSIS — E119 Type 2 diabetes mellitus without complications: Secondary | ICD-10-CM

## 2015-06-01 DIAGNOSIS — D649 Anemia, unspecified: Secondary | ICD-10-CM | POA: Diagnosis not present

## 2015-06-01 DIAGNOSIS — I10 Essential (primary) hypertension: Secondary | ICD-10-CM | POA: Diagnosis not present

## 2015-06-01 DIAGNOSIS — I5032 Chronic diastolic (congestive) heart failure: Secondary | ICD-10-CM | POA: Diagnosis not present

## 2015-06-01 LAB — CBC WITH DIFFERENTIAL/PLATELET
BASOS ABS: 0 10*3/uL (ref 0.0–0.1)
Basophils Relative: 0.3 % (ref 0.0–3.0)
EOS PCT: 2.2 % (ref 0.0–5.0)
Eosinophils Absolute: 0.1 10*3/uL (ref 0.0–0.7)
HCT: 32.8 % — ABNORMAL LOW (ref 36.0–46.0)
HEMOGLOBIN: 10.5 g/dL — AB (ref 12.0–15.0)
Lymphocytes Relative: 34.5 % (ref 12.0–46.0)
Lymphs Abs: 1.7 10*3/uL (ref 0.7–4.0)
MCHC: 31.9 g/dL (ref 30.0–36.0)
MCV: 79.2 fl (ref 78.0–100.0)
MONOS PCT: 9 % (ref 3.0–12.0)
Monocytes Absolute: 0.4 10*3/uL (ref 0.1–1.0)
Neutro Abs: 2.7 10*3/uL (ref 1.4–7.7)
Neutrophils Relative %: 54 % (ref 43.0–77.0)
Platelets: 395 10*3/uL (ref 150.0–400.0)
RBC: 4.14 Mil/uL (ref 3.87–5.11)
RDW: 30.5 % — ABNORMAL HIGH (ref 11.5–15.5)
WBC: 4.9 10*3/uL (ref 4.0–10.5)

## 2015-06-01 LAB — BASIC METABOLIC PANEL
BUN: 12 mg/dL (ref 6–23)
CHLORIDE: 104 meq/L (ref 96–112)
CO2: 30 mEq/L (ref 19–32)
CREATININE: 0.79 mg/dL (ref 0.40–1.20)
Calcium: 10.1 mg/dL (ref 8.4–10.5)
GFR: 75.07 mL/min (ref >60.00)
Glucose, Bld: 91 mg/dL (ref 70–99)
Potassium: 4.7 mEq/L (ref 3.5–5.1)
Sodium: 141 mEq/L (ref 135–145)

## 2015-06-01 LAB — HEPATIC FUNCTION PANEL
ALT: 17 U/L (ref 0–35)
AST: 21 U/L (ref 0–37)
Albumin: 3.7 g/dL (ref 3.5–5.2)
Alkaline Phosphatase: 54 U/L (ref 39–117)
BILIRUBIN TOTAL: 0.9 mg/dL (ref 0.2–1.2)
Bilirubin, Direct: 0.2 mg/dL (ref 0.0–0.3)
TOTAL PROTEIN: 7.2 g/dL (ref 6.0–8.3)

## 2015-06-01 LAB — LIPID PANEL
CHOL/HDL RATIO: 3
Cholesterol: 59 mg/dL (ref 0–200)
HDL: 20.8 mg/dL — AB (ref >39.00)
LDL Cholesterol: 18 mg/dL (ref 0–99)
NonHDL: 38.47
TRIGLYCERIDES: 104 mg/dL (ref 0.0–149.0)
VLDL: 20.8 mg/dL (ref 0.0–40.0)

## 2015-06-01 LAB — HEMOGLOBIN A1C: Hgb A1c MFr Bld: 5.8 % (ref 4.6–6.5)

## 2015-06-01 LAB — IBC PANEL
IRON: 99 ug/dL (ref 42–145)
Saturation Ratios: 25.8 % (ref 20.0–50.0)
TRANSFERRIN: 274 mg/dL (ref 212.0–360.0)

## 2015-06-01 MED ORDER — METOPROLOL TARTRATE 25 MG PO TABS: 12.5000 mg | ORAL_TABLET | Freq: Two times a day (BID) | ORAL | Status: DC

## 2015-06-01 MED ORDER — PANTOPRAZOLE SODIUM 40 MG PO TBEC: 40.0000 mg | DELAYED_RELEASE_TABLET | Freq: Every day | ORAL | Status: DC

## 2015-06-01 MED ORDER — ONETOUCH ULTRA 2 W/DEVICE KIT: PACK | Status: DC

## 2015-06-01 MED ORDER — GLUCOSE BLOOD VI STRP: ORAL_STRIP | Status: DC

## 2015-06-01 MED ORDER — ATORVASTATIN CALCIUM 80 MG PO TABS: 80.0000 mg | ORAL_TABLET | Freq: Every day | ORAL | Status: DC

## 2015-06-01 NOTE — Patient Instructions (Signed)
Please continue all other medications as before, and refills have been done if requested.  Please have the pharmacy call with any other refills you may need.  Please continue your efforts at being more active, low cholesterol diet, and weight control.  Please keep your appointments with your specialists as you may have planned  Please go to the LAB in the Basement (turn left off the elevator) for the tests to be done today, then return weekly for the repeat blood counts  You will be contacted by phone if any changes need to be made immediately.  Otherwise, you will receive a letter about your results with an explanation, but please check with MyChart first.  Please remember to sign up for MyChart if you have not done so, as this will be important to you in the future with finding out test results, communicating by private email, and scheduling acute appointments online when needed.

## 2015-06-01 NOTE — Progress Notes (Signed)
Subjective:    Patient ID: Mary Mccarthy, female    DOB: September 06, 1940, 75 y.o.   MRN: [id number]  HPI  Here to f/u recent hospn on Apr 10-16 , presented with multiple complaints. Patient had reportedly had a syncopal event at home she fell upon waking complained of shortness of breath and chest pain. She notes for the last 2 months at least she's had progressively worsening shortness of breath. Reports difficulty walking more than 200 feet without being extremely exhausted. However, symptoms appeared to worsen today where she reports intermittent subjective fever and chills all day. She was unable to move all to count due to extreme fatigue. Had associated symptoms of which she reports is intermittent "flip flopping" of her heart as she attributed to an irregular heartbeat. Within the last few days PTA she has noted increased urinary frequency at night. Order which she does not usually have. Notes multiple recent sick contacts has many members of her family have been sick with pneumonia and flu. She herself has been doing a lot of coughing and complaints of backaches and left shoulder blade pain She notes a known history of iron deficiency anemia, but was unable to take iron supplementation due to reports of diarrhea. Patient denies any black stools, abdominal pain, diarrhea, or constipation.  Admitted and Tx for Sepsis/e coli UTI, AKI, symptomatic anemia, Acute GI bleed, iron def anemia, diast CHF, CP with neg cardiac cath.  Transfused 2 u PRBC, seen per GI and card consulting. eEGD with gastritis, colonoscopy with non bleeding AVM.  Today is first day out of the house since went home. Walking with walker for support outside the home, does not use at home.  Holds onto furniture, No falls. Pt denies chest pain, increased sob or doe, wheezing, orthopnea, PND, increased LE swelling, palpitations, dizziness or syncope.  Pt denies new neurological symptoms such as new headache, or facial or extremity weakness or  numbness   Pt denies fever, wt loss, night sweats, loss of appetite, or other constitutional symptoms. Wt overall stable, hsa not yet started checking wts at home.  Wt Readings from Last 3 Encounters:  06/01/15 230 lb (104.327 kg)  05/18/15 230 lb 11.2 oz (104.645 kg)  11/20/14 221 lb (100.245 kg)  Has f/u appt with GI in early may 2017.  Needs standing order placed for cbc weekly over the next few wks.  Pain and anxiety overall not out of control, only uses sparingly klonopin and tramadol. Past Medical History  Diagnosis Date  . Left knee DJD   . ALLERGIC RHINITIS 06/24/2007  . DEPRESSION 10/08/2006  . DIABETES MELLITUS, TYPE II 10/05/2006  . GERD 06/24/2007  . HYPERLIPIDEMIA 10/08/2006  . HYPERTENSION 10/08/2006  . PEPTIC ULCER DISEASE 10/08/2006  . COLONIC POLYPS, HX OF 06/24/2007  . INSOMNIA 09/24/2008  . Anemia   . Heart murmur    Past Surgical History  Procedure Laterality Date  . Breast biopsy    . Appendectomy    . Tubal ligation    . Tonsillectomy    . Cardiac catheterization N/A 05/21/2015    Procedure: Left Heart Cath and Coronary Angiography;  Surgeon: [name], MD;  Location: [facility] INVASIVE CV LAB;  Service: Cardiovascular;  Laterality: N/A;  . Colonoscopy N/A 05/20/2015    Procedure: COLONOSCOPY;  Surgeon: Ruffin Frederick, MD;  Location: [facility] ENDOSCOPY;  Service: Gastroenterology;  Laterality: N/A;  . Esophagogastroduodenoscopy N/A 05/20/2015    Procedure: ESOPHAGOGASTRODUODENOSCOPY (EGD);  Surgeon: Ruffin Frederick, MD;  Location:  [facility] ENDOSCOPY;  Service: Gastroenterology;  Laterality: N/A;    reports that she has quit smoking. She does not have any smokeless tobacco history on file. She reports that she does not drink alcohol or use illicit drugs. family history includes Alcohol abuse in her other; Arthritis in her other; Cancer in her mother; Depression in her other; Diabetes in her other; Heart disease in her other; Hyperlipidemia in her other; Hypertension in her  other; Stroke in her other. Allergies  Allergen Reactions  . Ibuprofen Other (See Comments)    Bleeding events   Current Outpatient Prescriptions on File Prior to Visit  Medication Sig Dispense Refill  . acetaminophen (TYLENOL) 500 MG tablet Take 500 mg by mouth every 6 (six) hours as needed for moderate pain.    Marland Kitchen atorvastatin (LIPITOR) 80 MG tablet Take 1 tablet (80 mg total) by mouth daily at 6 PM. 30 tablet 0  . clonazePAM (KLONOPIN) 0.5 MG tablet TAKE ONE TO TWO TABLETS BY MOUTH AT NIGHT AS NEEDED FOR SLEEP 60 tablet 5  . glimepiride (AMARYL) 2 MG tablet Take 1 tablet (2 mg total) by mouth daily before breakfast. (Patient taking differently: Take 2 mg by mouth every evening. ) 30 tablet 4  . metFORMIN (GLUCOPHAGE) 1000 MG tablet 1 tab by mouth in the AM,and 1/2 tab in the PM (Patient taking differently: Take 1,000 mg by mouth every evening. ) 180 tablet 1  . metoprolol tartrate (LOPRESSOR) 25 MG tablet Take 0.5 tablets (12.5 mg total) by mouth 2 (two) times daily. 60 tablet 0  . pantoprazole (PROTONIX) 40 MG tablet Take 1 tablet (40 mg total) by mouth 2 (two) times daily. 60 tablet 0  . traMADol (ULTRAM) 50 MG tablet Take 1 tablet (50 mg total) by mouth every 8 (eight) hours as needed. 60 tablet 0  . vitamin C (VITAMIN C) 500 MG tablet Take 1 tablet (500 mg total) by mouth 2 (two) times daily. 60 tablet 0   No current facility-administered medications on file prior to visit.   Review of Systems  Constitutional: Negative for unusual diaphoresis or night sweats HENT: Negative for ear swelling or discharge Eyes: Negative for worsening visual haziness  Respiratory: Negative for choking and stridor.   Gastrointestinal: Negative for distension or worsening eructation Genitourinary: Negative for retention or change in urine volume.  Musculoskeletal: Negative for other MSK pain or swelling Skin: Negative for color change and worsening wound Neurological: Negative for tremors and numbness  other than noted  Psychiatric/Behavioral: Negative for decreased concentration or agitation other than above       Objective:   Physical Exam BP 140/80 mmHg  Pulse 77  Temp(Src) 98.3 F (36.8 C) (Oral)  Resp 20  Wt 230 lb (104.327 kg)  SpO2 97%  VS noted,  Constitutional: Pt appears in no apparent distress HENT: Head: NCAT.  Right Ear: External ear normal.  Left Ear: External ear normal.  Eyes: . Pupils are equal, round, and reactive to light. Conjunctivae and EOM are normal Neck: Normal range of motion. Neck supple.  Cardiovascular: Normal rate and regular rhythm.   Pulmonary/Chest: Effort normal and breath sounds without rales or wheezing.  Abd:  Soft, NT, ND, + BS Neurological: Pt is alert. Not confused , motor grossly intact Skin: Skin is warm. No rash, no LE edema Psychiatric: Pt behavior is normal. No agitation.       Assessment & Plan:

## 2015-06-01 NOTE — Progress Notes (Signed)
Pre visit review using our clinic review tool, if applicable. No additional management support is needed unless otherwise documented below in the visit note. 

## 2015-06-02 NOTE — Assessment & Plan Note (Signed)
stable overall by history and exam, recent data reviewed with pt, and pt to continue medical treatment as before,  to f/u any worsening symptoms or concerns BP Readings from Last 3 Encounters:  06/01/15 140/80  05/23/15 149/52  11/20/14 128/80

## 2015-06-02 NOTE — Assessment & Plan Note (Signed)
stable overall by history and exam, recent data reviewed with pt, and pt to continue medical treatment as before,  to f/u any worsening symptoms or concerns Lab Results  Component Value Date   WBC 4.9 06/01/2015   HGB 10.5* 06/01/2015   HCT 32.8* 06/01/2015   PLT 395.0 06/01/2015   GLUCOSE 91 06/01/2015   CHOL 59 06/01/2015   TRIG 104.0 06/01/2015   HDL 20.80* 06/01/2015   LDLCALC 18 06/01/2015   ALT 17 06/01/2015   AST 21 06/01/2015   NA 141 06/01/2015   K 4.7 06/01/2015   CL 104 06/01/2015   CREATININE 0.79 06/01/2015   BUN 12 06/01/2015   CO2 30 06/01/2015   TSH 2.80 11/20/2014   INR 1.18 05/21/2015   HGBA1C 5.8 06/01/2015   MICROALBUR <0.7 11/20/2014

## 2015-06-02 NOTE — Assessment & Plan Note (Signed)
stable overall by history and exam, recent data reviewed with pt, and pt to continue medical treatment as before,  to f/u any worsening symptoms or concerns Lab Results  Component Value Date   WBC 4.9 06/01/2015   HGB 10.5* 06/01/2015   HCT 32.8* 06/01/2015   MCV 79.2 06/01/2015   PLT 395.0 06/01/2015   For standing order cbc weekly to monitor for the next few wks, f/u GI as planned

## 2015-06-02 NOTE — Assessment & Plan Note (Signed)
stable overall by history and exam, recent data reviewed with pt, and pt to continue medical treatment as before,  to f/u any worsening symptoms or concerns Lab Results  Component Value Date   HGBA1C 5.8 06/01/2015    

## 2015-06-08 ENCOUNTER — Encounter: Payer: Self-pay | Admitting: Internal Medicine

## 2015-06-08 ENCOUNTER — Telehealth: Payer: Self-pay

## 2015-06-08 ENCOUNTER — Other Ambulatory Visit (INDEPENDENT_AMBULATORY_CARE_PROVIDER_SITE_OTHER): Payer: Medicare Other

## 2015-06-08 DIAGNOSIS — D509 Iron deficiency anemia, unspecified: Secondary | ICD-10-CM | POA: Diagnosis not present

## 2015-06-08 LAB — CBC WITH DIFFERENTIAL/PLATELET
BASOS PCT: 0.3 % (ref 0.0–3.0)
Basophils Absolute: 0 10*3/uL (ref 0.0–0.1)
Eosinophils Absolute: 0.1 10*3/uL (ref 0.0–0.7)
Eosinophils Relative: 2.5 % (ref 0.0–5.0)
HEMATOCRIT: 32.7 % — AB (ref 36.0–46.0)
HEMOGLOBIN: 10.6 g/dL — AB (ref 12.0–15.0)
LYMPHS PCT: 36.8 % (ref 12.0–46.0)
Lymphs Abs: 1.4 10*3/uL (ref 0.7–4.0)
MCHC: 32.4 g/dL (ref 30.0–36.0)
MCV: 81.5 fl (ref 78.0–100.0)
MONOS PCT: 10.7 % (ref 3.0–12.0)
Monocytes Absolute: 0.4 10*3/uL (ref 0.1–1.0)
Neutro Abs: 1.9 10*3/uL (ref 1.4–7.7)
Neutrophils Relative %: 49.7 % (ref 43.0–77.0)
PLATELETS: 313 10*3/uL (ref 150.0–400.0)
RBC: 4.01 Mil/uL (ref 3.87–5.11)
RDW: 31.9 % — AB (ref 11.5–15.5)
WBC: 3.9 10*3/uL — AB (ref 4.0–10.5)

## 2015-06-08 NOTE — Telephone Encounter (Signed)
Called pt to advise letter was available for pick up. Pt stated that she already picked up letter (perhaps a copy had been generated). Signed copy placed in cabinet

## 2015-06-08 NOTE — Telephone Encounter (Signed)
Pt request letter excusing her from jury duty.  Letter generated and placed on MD's desk for signature

## 2015-06-15 ENCOUNTER — Other Ambulatory Visit: Payer: Self-pay | Admitting: *Deleted

## 2015-06-15 ENCOUNTER — Telehealth: Payer: Self-pay | Admitting: Gastroenterology

## 2015-06-15 MED ORDER — METOPROLOL TARTRATE 25 MG PO TABS: 12.5000 mg | ORAL_TABLET | Freq: Two times a day (BID) | ORAL | Status: DC

## 2015-06-15 MED ORDER — ATORVASTATIN CALCIUM 80 MG PO TABS: 80.0000 mg | ORAL_TABLET | Freq: Every day | ORAL | Status: DC

## 2015-06-15 MED ORDER — PANTOPRAZOLE SODIUM 40 MG PO TBEC: 40.0000 mg | DELAYED_RELEASE_TABLET | Freq: Every day | ORAL | Status: DC

## 2015-06-15 NOTE — Telephone Encounter (Signed)
Called patient to inform refill sent

## 2015-06-15 NOTE — Telephone Encounter (Signed)
Received call pt states she is needing refill on her metoprolol & atorvastatin to [facility] pharmacy. Inform pt sending electronically...Raechel Chute

## 2015-06-21 ENCOUNTER — Other Ambulatory Visit (INDEPENDENT_AMBULATORY_CARE_PROVIDER_SITE_OTHER): Payer: Medicare Other

## 2015-06-21 DIAGNOSIS — D509 Iron deficiency anemia, unspecified: Secondary | ICD-10-CM

## 2015-06-21 LAB — CBC WITH DIFFERENTIAL/PLATELET
BASOS PCT: 0.5 % (ref 0.0–3.0)
Basophils Absolute: 0 10*3/uL (ref 0.0–0.1)
EOS PCT: 3.4 % (ref 0.0–5.0)
Eosinophils Absolute: 0.1 10*3/uL (ref 0.0–0.7)
HEMATOCRIT: 30.7 % — AB (ref 36.0–46.0)
Hemoglobin: 10 g/dL — ABNORMAL LOW (ref 12.0–15.0)
LYMPHS ABS: 1.5 10*3/uL (ref 0.7–4.0)
LYMPHS PCT: 34 % (ref 12.0–46.0)
MCHC: 32.6 g/dL (ref 30.0–36.0)
MCV: 85.7 fl (ref 78.0–100.0)
MONOS PCT: 8 % (ref 3.0–12.0)
Monocytes Absolute: 0.4 10*3/uL (ref 0.1–1.0)
NEUTROS ABS: 2.4 10*3/uL (ref 1.4–7.7)
Neutrophils Relative %: 54.1 % (ref 43.0–77.0)
PLATELETS: 167 10*3/uL (ref 150.0–400.0)
RBC: 3.58 Mil/uL — ABNORMAL LOW (ref 3.87–5.11)
RDW: 31.1 % — AB (ref 11.5–15.5)
WBC: 4.5 10*3/uL (ref 4.0–10.5)

## 2015-06-22 ENCOUNTER — Encounter: Payer: Self-pay | Admitting: Internal Medicine

## 2015-06-29 ENCOUNTER — Other Ambulatory Visit (INDEPENDENT_AMBULATORY_CARE_PROVIDER_SITE_OTHER): Payer: Medicare Other

## 2015-06-29 ENCOUNTER — Encounter: Payer: Self-pay | Admitting: Internal Medicine

## 2015-06-29 DIAGNOSIS — D509 Iron deficiency anemia, unspecified: Secondary | ICD-10-CM | POA: Diagnosis not present

## 2015-06-29 LAB — CBC WITH DIFFERENTIAL/PLATELET
BASOS ABS: 0 10*3/uL (ref 0.0–0.1)
BASOS PCT: 0.3 % (ref 0.0–3.0)
EOS ABS: 0.1 10*3/uL (ref 0.0–0.7)
Eosinophils Relative: 2.3 % (ref 0.0–5.0)
HEMATOCRIT: 31.7 % — AB (ref 36.0–46.0)
HEMOGLOBIN: 10.5 g/dL — AB (ref 12.0–15.0)
LYMPHS PCT: 35.5 % (ref 12.0–46.0)
Lymphs Abs: 1.7 10*3/uL (ref 0.7–4.0)
MCHC: 33.1 g/dL (ref 30.0–36.0)
MCV: 88 fl (ref 78.0–100.0)
Monocytes Absolute: 0.4 10*3/uL (ref 0.1–1.0)
Monocytes Relative: 7.5 % (ref 3.0–12.0)
Neutro Abs: 2.6 10*3/uL (ref 1.4–7.7)
Neutrophils Relative %: 54.4 % (ref 43.0–77.0)
Platelets: 224 10*3/uL (ref 150.0–400.0)
RBC: 3.6 Mil/uL — ABNORMAL LOW (ref 3.87–5.11)
RDW: 29.1 % — ABNORMAL HIGH (ref 11.5–15.5)
WBC: 4.8 10*3/uL (ref 4.0–10.5)

## 2015-07-06 ENCOUNTER — Other Ambulatory Visit (INDEPENDENT_AMBULATORY_CARE_PROVIDER_SITE_OTHER): Payer: Medicare Other

## 2015-07-06 DIAGNOSIS — D509 Iron deficiency anemia, unspecified: Secondary | ICD-10-CM | POA: Diagnosis not present

## 2015-07-06 LAB — CBC WITH DIFFERENTIAL/PLATELET
BASOS ABS: 0 10*3/uL (ref 0.0–0.1)
Basophils Relative: 0.3 % (ref 0.0–3.0)
EOS ABS: 0.1 10*3/uL (ref 0.0–0.7)
Eosinophils Relative: 2.9 % (ref 0.0–5.0)
HEMATOCRIT: 34.5 % — AB (ref 36.0–46.0)
Hemoglobin: 11.5 g/dL — ABNORMAL LOW (ref 12.0–15.0)
LYMPHS ABS: 1.9 10*3/uL (ref 0.7–4.0)
LYMPHS PCT: 42.3 % (ref 12.0–46.0)
MCHC: 33.3 g/dL (ref 30.0–36.0)
MCV: 88.8 fl (ref 78.0–100.0)
MONOS PCT: 9.9 % (ref 3.0–12.0)
Monocytes Absolute: 0.4 10*3/uL (ref 0.1–1.0)
NEUTROS PCT: 44.6 % (ref 43.0–77.0)
Neutro Abs: 2 10*3/uL (ref 1.4–7.7)
PLATELETS: 278 10*3/uL (ref 150.0–400.0)
RBC: 3.89 Mil/uL (ref 3.87–5.11)
RDW: 27.3 % — ABNORMAL HIGH (ref 11.5–15.5)
WBC: 4.4 10*3/uL (ref 4.0–10.5)

## 2015-07-09 ENCOUNTER — Ambulatory Visit (INDEPENDENT_AMBULATORY_CARE_PROVIDER_SITE_OTHER): Payer: Medicare Other | Admitting: Gastroenterology

## 2015-07-09 ENCOUNTER — Encounter: Payer: Self-pay | Admitting: Gastroenterology

## 2015-07-09 ENCOUNTER — Ambulatory Visit (HOSPITAL_COMMUNITY)
Admission: RE | Admit: 2015-07-09 | Discharge: 2015-07-09 | Disposition: A | Discharge status: Home / Self Care | Payer: Medicare Other | Source: Ambulatory Visit | Attending: Gastroenterology | Admitting: Gastroenterology

## 2015-07-09 VITALS — BP 196/82 | HR 71 | Ht 61.0 in | Wt 212.0 lb

## 2015-07-09 DIAGNOSIS — R600 Localized edema: Secondary | ICD-10-CM

## 2015-07-09 DIAGNOSIS — D509 Iron deficiency anemia, unspecified: Secondary | ICD-10-CM | POA: Diagnosis not present

## 2015-07-09 DIAGNOSIS — K259 Gastric ulcer, unspecified as acute or chronic, without hemorrhage or perforation: Secondary | ICD-10-CM

## 2015-07-09 DIAGNOSIS — Q2733 Arteriovenous malformation of digestive system vessel: Secondary | ICD-10-CM

## 2015-07-09 DIAGNOSIS — I10 Essential (primary) hypertension: Secondary | ICD-10-CM

## 2015-07-09 DIAGNOSIS — K5520 Angiodysplasia of colon without hemorrhage: Secondary | ICD-10-CM

## 2015-07-09 NOTE — Progress Notes (Signed)
HPI :  75 y/o female here for iron deficiency anemia / GI Bleed follow up. Admitted in April for this issue. On admission she had a Hgb of 5 with ferritin level of 4. She had demand related cardiac ischemia due to severe anemia. Echo showed EF 55-60%. No significant stenosis on cardiac cath to cause her ischemia but multiple vessels with some stenosis. Cardiology recommended medical management.  EGD showed multiple linear gastric ulcers, H pylori negative Colonoscopy showed a few right sided AVMs which were treated with APC, no polyps noted.   She reports feeling well without complaints. She has some swelling in her feet over the past week and in her legs, specifically the right leg is very "tight" and painful today . She denies any blood in the stools but she never had any overt bleeding. She has roughly anywhere from a few stools per day, usually loose stools. Brown stools. No abdominal pains that are bothering her. She is eating okay, no vomiting. She is taking protonix 52m once daily. She denies any NSAID use. She denied any prior use of NSAIDs prior to hospitalization. Cardiology had recommended resuming an aspirin but she has not yet taken aspirin since her hosptilazation. Last Hgb has been in the 11s and iron levels normalized.   Of note patient reports she has history of white coat hypertension and is anxious here today.   Past Medical History  Diagnosis Date  . Left knee DJD   . ALLERGIC RHINITIS 06/24/2007  . DEPRESSION 10/08/2006  . DIABETES MELLITUS, TYPE II 10/05/2006  . GERD 06/24/2007  . HYPERLIPIDEMIA 10/08/2006  . HYPERTENSION 10/08/2006  . PEPTIC ULCER DISEASE 10/08/2006  . COLONIC POLYPS, HX OF 06/24/2007  . INSOMNIA 09/24/2008  . Anemia   . Heart murmur   . AVM (arteriovenous malformation) of colon      Past Surgical History  Procedure Laterality Date  . Breast biopsy    . Appendectomy    . Tubal ligation    . Tonsillectomy    . Cardiac catheterization N/A 05/21/2015   Procedure: Left Heart Cath and Coronary Angiography;  Surgeon: DLeonie Man MD;  [facility];  Service: Cardiovascular;  Laterality: N/A;  . Colonoscopy N/A 05/20/2015    Procedure: COLONOSCOPY;  Surgeon: SManus Gunning MD;  [facility];  Service: Gastroenterology;  Laterality: N/A;  . Esophagogastroduodenoscopy N/A 05/20/2015    Procedure: ESOPHAGOGASTRODUODENOSCOPY (EGD);  Surgeon: SManus Gunning MD;  [facility]  Service: Gastroenterology;  Laterality: N/A;   Family History  Problem Relation Age of Onset  . Cancer Mother     ENT cancer  . Alcohol abuse Other   . Arthritis Other     DJD  . Hyperlipidemia Other   . Heart disease Other   . Stroke Other   . Hypertension Other   . Depression Other   . Diabetes Other    Social History  Substance Use Topics  . Smoking status: Never Smoker   . Smokeless tobacco: Never Used  . Alcohol Use: No   Current Outpatient Prescriptions  Medication Sig Dispense Refill  . acetaminophen (TYLENOL) 500 MG tablet Take 500 mg by mouth every 6 (six) hours as needed for moderate pain.    .Marland Kitchenatorvastatin (LIPITOR) 80 MG tablet Take 1 tablet (80 mg total) by mouth daily at 6 PM. 90 tablet 3  . Blood Glucose Monitoring Suppl (ONE TOUCH ULTRA 2) w/Device KIT Use as directed daily 1 each 0  .  clonazePAM (KLONOPIN) 0.5 MG tablet TAKE ONE TO TWO TABLETS BY MOUTH AT NIGHT AS NEEDED FOR SLEEP 60 tablet 5  . glimepiride (AMARYL) 2 MG tablet Take 1 tablet (2 mg total) by mouth daily before breakfast. (Patient taking differently: Take 2 mg by mouth every evening. ) 30 tablet 4  . glucose blood (ONE TOUCH ULTRA TEST) test strip Use as instructed 100 each 12  . metFORMIN (GLUCOPHAGE) 1000 MG tablet 1 tab by mouth in the AM,and 1/2 tab in the PM (Patient taking differently: Take 1,000 mg by mouth every evening. ) 180 tablet 1  . metoprolol tartrate (LOPRESSOR) 25 MG tablet Take 0.5 tablets (12.5 mg total) by  mouth 2 (two) times daily. 180 tablet 3  . pantoprazole (PROTONIX) 40 MG tablet Take 1 tablet (40 mg total) by mouth daily. 30 tablet 2  . traMADol (ULTRAM) 50 MG tablet Take 1 tablet (50 mg total) by mouth every 8 (eight) hours as needed. 60 tablet 0  . vitamin C (VITAMIN C) 500 MG tablet Take 1 tablet (500 mg total) by mouth 2 (two) times daily. 60 tablet 0   No current facility-administered medications for this visit.   Allergies  Allergen Reactions  . Ibuprofen Other (See Comments)    Bleeding events     Review of Systems: All systems reviewed and negative except where noted in HPI.   Lab Results  Component Value Date   WBC 4.4 07/06/2015   HGB 11.5* 07/06/2015   HCT 34.5* 07/06/2015   MCV 88.8 07/06/2015   PLT 278.0 07/06/2015    Lab Results  Component Value Date   CREATININE 0.79 06/01/2015   BUN 12 06/01/2015   NA 141 06/01/2015   K 4.7 06/01/2015   CL 104 06/01/2015   CO2 30 06/01/2015    Lab Results  Component Value Date   ALT 17 06/01/2015   AST 21 06/01/2015   ALKPHOS 54 06/01/2015   BILITOT 0.9 06/01/2015   Lab Results  Component Value Date   IRON 99 06/01/2015   TIBC 434 05/17/2015   FERRITIN 4* 05/17/2015     Physical Exam: BP 196/82 mmHg  Pulse 71  Ht _0  (1.549 m)  Wt 212 lb (96.163 kg)  BMI 40.08 kg/m2 Constitutional: Pleasant,well-developed, female in no acute distress. HEENT: Normocephalic and atraumatic. Conjunctivae are normal. No scleral icterus. Neck supple.  Cardiovascular: Normal rate, regular rhythm. 2/6 SEM  Pulmonary/chest: Effort normal and breath sounds normal. No wheezing, rales or rhonchi. Abdominal: Soft, nondistended, nontender. Bowel sounds active throughout. There are no masses palpable. No hepatomegaly. Extremities: R LE edema / tightness, with tenderness to palpation in the calf Lymphadenopathy: No cervical adenopathy noted. Neurological: Alert and oriented to person place and time. Skin: Skin is warm and dry.  No rashes noted. Psychiatric: Normal mood and affect. Behavior is normal.   ASSESSMENT AND PLAN: 75 y/o female who presented with severe iron deficiency anemia (Hgb 5, ferritin 4) leading to demand related cardiac ischemia. EGD and colonoscopy as above, I suspect her anemia was due to multiple linear gastric ulcers although the colonic AVMs were treated. Unclear etiology for her ulcer as she was H pylori negative, possibly related to her aspirin use but denied other NSAIDs. Her Hgb and iron levels have almost normalized. Regarding long term management, okay from my perspective to start aspirin 32m daily as long as she takes low dose PPI prophylaxis. I otherwise recommend EGD to ensure interval healing of gastric ulcers if she warrants  long term aspirin and ensure her stomach has healed given this exam was done for anemia / GI blood loss. I discussed risks / benefits of endoscopy with her and she wished to proceed.   Of note, patient complained of right lower leg pain and tightness today. We sent her to radiology for US of the R leg and they called and reported no DVT, it was normal. She should follow up with her primary care for futher evaluation of her edema. Her BP was noted to be quite elevated today and higher than prior reported values in Epic. The patient reports she has white coat HTN. I recommend she take her BP at home and ensure normal. She has no headaches, chest pains, etc at this time and feels well otherwise. If her BP remains elevated she needs to seek re-evaluation for this issue.   Obetz Cellar, MD [facility] Pager [phone]  CC: Biagio Borg, MD

## 2015-07-09 NOTE — Progress Notes (Signed)
*  PRELIMINARY RESULTS* Vascular Ultrasound Right lower extremity venous duplex has been completed.  Preliminary findings: No evidence of DVT or baker's cyst.   Called results to [facility].    Farrel Demark, RDMS, RVT  07/09/2015, 11:16 AM

## 2015-07-09 NOTE — Patient Instructions (Addendum)
Please go to the [facility] main entrance [facility] and check in for your Ultrasound of your leg. You need to arrive at 11:00am for your ultrasound.   You have been scheduled for an endoscopy. Please follow written instructions given to you at your visit today. If you use inhalers (even only as needed), please bring them with you on the day of your procedure.

## 2015-07-12 ENCOUNTER — Encounter: Payer: Self-pay | Admitting: Gastroenterology

## 2015-07-12 ENCOUNTER — Other Ambulatory Visit: Payer: Self-pay | Admitting: *Deleted

## 2015-07-12 MED ORDER — METOPROLOL TARTRATE 25 MG PO TABS: 12.5000 mg | ORAL_TABLET | Freq: Two times a day (BID) | ORAL | Status: DC

## 2015-07-15 ENCOUNTER — Encounter: Payer: Self-pay | Admitting: Gastroenterology

## 2015-07-15 ENCOUNTER — Ambulatory Visit (AMBULATORY_SURGERY_CENTER): Payer: Medicare Other | Admitting: Gastroenterology

## 2015-07-15 VITALS — BP 157/69 | HR 69 | Temp 98.4°F | Resp 16 | Ht 61.0 in | Wt 212.0 lb

## 2015-07-15 DIAGNOSIS — D509 Iron deficiency anemia, unspecified: Secondary | ICD-10-CM | POA: Diagnosis present

## 2015-07-15 DIAGNOSIS — E119 Type 2 diabetes mellitus without complications: Secondary | ICD-10-CM | POA: Diagnosis not present

## 2015-07-15 DIAGNOSIS — K2950 Unspecified chronic gastritis without bleeding: Secondary | ICD-10-CM | POA: Diagnosis not present

## 2015-07-15 DIAGNOSIS — D649 Anemia, unspecified: Secondary | ICD-10-CM | POA: Diagnosis not present

## 2015-07-15 LAB — GLUCOSE, CAPILLARY
Glucose-Capillary: 99 mg/dL (ref 65–99)
Glucose-Capillary: 86 mg/dL (ref 65–99)

## 2015-07-15 MED ORDER — SODIUM CHLORIDE 0.9 % IV SOLN: 500.0000 mL | INTRAVENOUS | Status: DC

## 2015-07-15 NOTE — Patient Instructions (Signed)
YOU HAD AN ENDOSCOPIC PROCEDURE TODAY AT THE [facility]:   Refer to the procedure report that was given to you for any specific questions about what was found during the examination.  If the procedure report does not answer your questions, please call your gastroenterologist to clarify.  If you requested that your care partner not be given the details of your procedure findings, then the procedure report has been included in a sealed envelope for you to review at your convenience later.  YOU SHOULD EXPECT: Some feelings of bloating in the abdomen. Passage of more gas than usual.  Walking can help get rid of the air that was put into your GI tract during the procedure and reduce the bloating. If you had a lower endoscopy (such as a colonoscopy or flexible sigmoidoscopy) you may notice spotting of blood in your stool or on the toilet paper. If you underwent a bowel prep for your procedure, you may not have a normal bowel movement for a few days.  Please Note:  You might notice some irritation and congestion in your nose or some drainage.  This is from the oxygen used during your procedure.  There is no need for concern and it should clear up in a day or so.  SYMPTOMS TO REPORT IMMEDIATELY:    Following upper endoscopy (EGD)  Vomiting of blood or coffee ground material  New chest pain or pain under the shoulder blades  Painful or persistently difficult swallowing  New shortness of breath  Fever of 100F or higher  Black, tarry-looking stools  For urgent or emergent issues, a gastroenterologist can be reached at any hour by calling [phone].   DIET: Your first meal following the procedure should be a small meal and then it is ok to progress to your normal diet. Heavy or fried foods are harder to digest and may make you feel nauseous or bloated.  Likewise, meals heavy in dairy and vegetables can increase bloating.  Drink plenty of fluids but you should avoid alcoholic beverages  for 24 hours.  ACTIVITY:  You should plan to take it easy for the rest of today and you should NOT DRIVE or use heavy machinery until tomorrow (because of the sedation medicines used during the test).    FOLLOW UP: Our staff will call the number listed on your records the next business day following your procedure to check on you and address any questions or concerns that you may have regarding the information given to you following your procedure. If we do not reach you, we will leave a message.  However, if you are feeling well and you are not experiencing any problems, there is no need to return our call.  We will assume that you have returned to your regular daily activities without incident.  If any biopsies were taken you will be contacted by phone or by letter within the next 1-3 weeks.  Please call us at [phone] if you have not heard about the biopsies in 3 weeks.    SIGNATURES/CONFIDENTIALITY: You and/or your care partner have signed paperwork which will be entered into your electronic medical record.  These signatures attest to the fact that that the information above on your After Visit Summary has been reviewed and is understood.  Full responsibility of the confidentiality of this discharge information lies with you and/or your care-partner. 

## 2015-07-15 NOTE — Op Note (Signed)
[facility] Patient Name: Mary Mccarthy Procedure Date: 07/15/2015 10:09 AM MRN: [id number] Endoscopist: Viviann Spare P. Adela Lank , MD Age: 75 Referring MD:  Date of Birth: 07/27/1940 Gender: Female Procedure:                Upper GI endoscopy Indications:              Follow-up of peptic ulcer with hemorrhage Medicines:                Monitored Anesthesia Care Procedure:                Pre-Anesthesia Assessment:                           - Prior to the procedure, a History and Physical                            was performed, and patient medications and                            allergies were reviewed. The patient's tolerance of                            previous anesthesia was also reviewed. The risks                            and benefits of the procedure and the sedation                            options and risks were discussed with the patient.                            All questions were answered, and informed consent                            was obtained. Prior Anticoagulants: The patient has                            taken no previous anticoagulant or antiplatelet                            agents. ASA Grade Assessment: III - A patient with                            severe systemic disease. After reviewing the risks                            and benefits, the patient was deemed in                            satisfactory condition to undergo the procedure.                           After obtaining informed consent, the endoscope was  passed under direct vision. Throughout the                            procedure, the patient's blood pressure, pulse, and                            oxygen saturations were monitored continuously. The                            Model GIF-HQ190 [id number]) scope was introduced                            through the mouth, and advanced to the second part                            of duodenum. The upper  GI endoscopy was                            accomplished without difficulty. The patient                            tolerated the procedure well. Scope In: Scope Out: Findings:                 Esophagogastric landmarks were identified: the                            Z-line was found at 41 cm, the gastroesophageal                            junction was found at 41 cm and the upper extent of                            the gastric folds was found at 41 cm from the                            incisors.                           The Z-line was slightly irregular, < 0.5cm, without                            nodularity. No biopsies taken per ACG guidelines..                           The exam of the esophagus was otherwise normal.                           Patchy mildly erythematous mucosa was found in the                            gastric antrum. Biopsies were taken with a cold  forceps for Helicobacter pylori testing.                           The exam of the stomach was otherwise normal. Prior                            ulcerations have intervally healed                           The duodenal bulb and second portion of the                            duodenum were normal. Complications:            No immediate complications. Estimated blood loss:                            Minimal. Estimated Blood Loss:     Estimated blood loss was minimal. Impression:               - Esophagogastric landmarks identified.                           - Z-line mildly irregular, no biopsies taken.                           - Erythematous mucosa in the antrum. Biopsied.                           - Normal duodenal bulb and second portion of the                            duodenum. Recommendation:           - Patient has a contact number available for                            emergencies. The signs and symptoms of potential                            delayed complications were discussed  with the                            patient. Return to normal activities tomorrow.                            Written discharge instructions were provided to the                            patient.                           - Resume previous diet.                           - Continue present medications.                           -  Resume aspirin                           - Decrease protonix to 20mg  daily                           - Await pathology results.                           - No repeat upper endoscopy. Viviann Spare P. Carrissa Taitano, MD 07/15/2015 10:29:04 AM This report has been signed electronically.

## 2015-07-15 NOTE — Progress Notes (Signed)
To PACU  Awake and alert.  Report to RN 

## 2015-07-15 NOTE — Progress Notes (Signed)
Called to room to assist during endoscopic procedure.  Patient ID and intended procedure confirmed with present staff. Received instructions for my participation in the procedure from the performing physician.  

## 2015-07-16 ENCOUNTER — Telehealth: Payer: Self-pay | Admitting: *Deleted

## 2015-07-16 NOTE — Telephone Encounter (Signed)
  Follow up Call-  Call back number 07/15/2015  Post procedure Call Back phone  # [phone]  Permission to leave phone message Yes     Patient questions:  Do you have a fever, pain , or abdominal swelling? No. Pain Score  0 *  Have you tolerated food without any problems? Yes.    Have you been able to return to your normal activities? Yes.    Do you have any questions about your discharge instructions: Diet   No. Medications  No. Follow up visit  No.  Do you have questions or concerns about your Care? No.  Actions: * If pain score is 4 or above: No action needed, pain <4.

## 2015-07-26 ENCOUNTER — Other Ambulatory Visit: Payer: Self-pay | Admitting: Gastroenterology

## 2015-07-26 ENCOUNTER — Encounter: Payer: Self-pay | Admitting: Gastroenterology

## 2015-07-26 DIAGNOSIS — D649 Anemia, unspecified: Secondary | ICD-10-CM

## 2015-08-04 ENCOUNTER — Telehealth: Payer: Self-pay

## 2015-08-04 MED ORDER — GLIMEPIRIDE 2 MG PO TABS: 2.0000 mg | ORAL_TABLET | Freq: Every day | ORAL | Status: DC

## 2015-08-04 NOTE — Telephone Encounter (Signed)
Medication refill sent to pharmacy  

## 2015-08-30 ENCOUNTER — Other Ambulatory Visit (INDEPENDENT_AMBULATORY_CARE_PROVIDER_SITE_OTHER): Payer: Medicare Other

## 2015-08-30 DIAGNOSIS — D509 Iron deficiency anemia, unspecified: Secondary | ICD-10-CM

## 2015-08-30 LAB — CBC WITH DIFFERENTIAL/PLATELET
BASOS ABS: 0 10*3/uL (ref 0.0–0.1)
Basophils Relative: 0.4 % (ref 0.0–3.0)
EOS ABS: 0.1 10*3/uL (ref 0.0–0.7)
Eosinophils Relative: 2.6 % (ref 0.0–5.0)
HCT: 32.9 % — ABNORMAL LOW (ref 36.0–46.0)
Hemoglobin: 10.9 g/dL — ABNORMAL LOW (ref 12.0–15.0)
LYMPHS ABS: 2 10*3/uL (ref 0.7–4.0)
Lymphocytes Relative: 34.9 % (ref 12.0–46.0)
MCHC: 33.1 g/dL (ref 30.0–36.0)
MCV: 94.7 fl (ref 78.0–100.0)
MONO ABS: 0.4 10*3/uL (ref 0.1–1.0)
Monocytes Relative: 6.5 % (ref 3.0–12.0)
NEUTROS ABS: 3.1 10*3/uL (ref 1.4–7.7)
NEUTROS PCT: 55.6 % (ref 43.0–77.0)
PLATELETS: 198 10*3/uL (ref 150.0–400.0)
RBC: 3.47 Mil/uL — AB (ref 3.87–5.11)
RDW: 13.6 % (ref 11.5–15.5)
WBC: 5.6 10*3/uL (ref 4.0–10.5)

## 2015-09-01 ENCOUNTER — Encounter: Payer: Self-pay | Admitting: Internal Medicine

## 2015-09-01 ENCOUNTER — Ambulatory Visit (INDEPENDENT_AMBULATORY_CARE_PROVIDER_SITE_OTHER): Payer: Medicare Other | Admitting: Internal Medicine

## 2015-09-01 ENCOUNTER — Telehealth: Payer: Self-pay | Admitting: Gastroenterology

## 2015-09-01 ENCOUNTER — Other Ambulatory Visit: Payer: Self-pay | Admitting: *Deleted

## 2015-09-01 VITALS — BP 142/84 | HR 78 | Temp 98.0°F | Resp 20 | Wt 208.0 lb

## 2015-09-01 DIAGNOSIS — E119 Type 2 diabetes mellitus without complications: Secondary | ICD-10-CM

## 2015-09-01 DIAGNOSIS — I10 Essential (primary) hypertension: Secondary | ICD-10-CM

## 2015-09-01 DIAGNOSIS — E785 Hyperlipidemia, unspecified: Secondary | ICD-10-CM | POA: Diagnosis not present

## 2015-09-01 DIAGNOSIS — Z0001 Encounter for general adult medical examination with abnormal findings: Secondary | ICD-10-CM

## 2015-09-01 DIAGNOSIS — D509 Iron deficiency anemia, unspecified: Secondary | ICD-10-CM

## 2015-09-01 DIAGNOSIS — I5032 Chronic diastolic (congestive) heart failure: Secondary | ICD-10-CM | POA: Diagnosis not present

## 2015-09-01 DIAGNOSIS — R6889 Other general symptoms and signs: Secondary | ICD-10-CM

## 2015-09-01 MED ORDER — ATORVASTATIN CALCIUM 40 MG PO TABS: 40.0000 mg | ORAL_TABLET | Freq: Every day | ORAL | 3 refills | Status: DC

## 2015-09-01 MED ORDER — LOSARTAN POTASSIUM 25 MG PO TABS: 25.0000 mg | ORAL_TABLET | Freq: Every day | ORAL | 3 refills | Status: DC

## 2015-09-01 NOTE — Patient Instructions (Signed)
Ok to decrease the lipitor to 40 mg per day (a new script is sent to the pharmacy, but you can take HALF of the current 80 mg pills per day until they are gone)  Please take all new medication as prescribed - the losartan 25 mg per day, and the lasix 20 mg per day  Please check your weight at home today, and call with your weight in 2 weeks to compare  Please go to the LAB in the Basement (turn left off the elevator) for the tests to be done IN 2 WEEKS  You will be contacted by phone if any changes need to be made immediately.  Otherwise, you will receive a letter about your results with an explanation, but please check with MyChart first.  Please remember to sign up for MyChart if you have not done so, as this will be important to you in the future with finding out test results, communicating by private email, and scheduling acute appointments online when needed.  Please continue all other medications as before, and refills have been done if requested.  Please have the pharmacy call with any other refills you may need.  Please continue your efforts at being more active, low cholesterol diabetic diet, and weight control.  Please keep your appointments with your specialists as you may have planned; please stop at the 3rd floor today to make your return office visit appt with Dr Adela Lank  Please return in 6 months, or sooner if needed, with Lab testing done 3-5 days before

## 2015-09-01 NOTE — Telephone Encounter (Signed)
Patient given recommendations. Lab in EPIC. 

## 2015-09-01 NOTE — Telephone Encounter (Signed)
Chart reviewed. I think we can repeat blood work with CBC and iron studies in about 6 weeks and see if stable. Her last CBC showed relatively stable Hgb, with rising MCV. Thanks

## 2015-09-01 NOTE — Telephone Encounter (Signed)
Patient is calling to see if she needs a f/u OV. Please, advise.

## 2015-09-01 NOTE — Progress Notes (Signed)
Subjective:    Patient ID: Mary Mccarthy, female    DOB: 02/18/1940, 75 y.o.   MRN: [id number]  HPI  Here to f/u; overall doing ok,  Pt denies chest pain, increasing sob or doe, wheezing, orthopnea, PND, palpitations, dizziness or syncope, but has had 2 wks worsening bilat LE edema with some mild discomfort in general.  .  Pt denies new neurological symptoms such as new headache, or facial or extremity weakness or numbness.  Pt denies polydipsia, polyuria, or low sugar episode.   Pt denies new neurological symptoms such as new headache, or facial or extremity weakness or numbness.   Pt states overall good compliance with meds, mostly trying to follow appropriat diet. Appetite is good, has lost several lbs with better diet, cut out the junk food. Wt Readings from Last 3 Encounters:  09/01/15 208 lb (94.3 kg)  07/15/15 212 lb (96.2 kg)  07/09/15 212 lb (96.2 kg)   Past Medical History:  Diagnosis Date  . ALLERGIC RHINITIS 06/24/2007  . Anemia   . AVM (arteriovenous malformation) of colon   . Blood transfusion without reported diagnosis 05/2015  . COLONIC POLYPS, HX OF 06/24/2007  . DEPRESSION 10/08/2006  . DIABETES MELLITUS, TYPE II 10/05/2006  . GERD 06/24/2007  . Heart murmur   . HYPERLIPIDEMIA 10/08/2006  . HYPERTENSION 10/08/2006  . INSOMNIA 09/24/2008  . Left knee DJD   . Myocardial infarction ([place]) 05/2015   pt states mild  . PEPTIC ULCER DISEASE 10/08/2006   Past Surgical History:  Procedure Laterality Date  . APPENDECTOMY    . BREAST BIOPSY    . CARDIAC CATHETERIZATION N/A 05/21/2015   Procedure: Left Heart Cath and Coronary Angiography;  Surgeon: Leonie Man, MD;  Location: [place] [facility];  Service: Cardiovascular;  Laterality: N/A;  . COLONOSCOPY N/A 05/20/2015   Procedure: COLONOSCOPY;  Surgeon: Manus Gunning, MD;  Location: [place];  Service: Gastroenterology;  Laterality: N/A;  . ESOPHAGOGASTRODUODENOSCOPY N/A 05/20/2015   Procedure: ESOPHAGOGASTRODUODENOSCOPY  (EGD);  Surgeon: Manus Gunning, MD;  Location: [place];  Service: Gastroenterology;  Laterality: N/A;  . TONSILLECTOMY    . TUBAL LIGATION      reports that she has never smoked. She has never used smokeless tobacco. She reports that she does not drink alcohol or use drugs. family history includes Alcohol abuse in her other; Arthritis in her other; Cancer in her mother; Depression in her other; Diabetes in her other; Heart disease in her other; Hyperlipidemia in her other; Hypertension in her other; Stroke in her other. Allergies  Allergen Reactions  . Ibuprofen Other (See Comments)    Bleeding events   Current Outpatient Prescriptions on File Prior to Visit  Medication Sig Dispense Refill  . acetaminophen (TYLENOL) 500 MG tablet Take 500 mg by mouth every 6 (six) hours as needed for moderate pain.    . Blood Glucose Monitoring Suppl (ONE TOUCH ULTRA 2) w/Device KIT Use as directed daily 1 each 0  . glimepiride (AMARYL) 2 MG tablet Take 1 tablet (2 mg total) by mouth daily before breakfast. 30 tablet 4  . glucose blood (ONE TOUCH ULTRA TEST) test strip Use as instructed 100 each 12  . metFORMIN (GLUCOPHAGE) 1000 MG tablet 1 tab by mouth in the AM,and 1/2 tab in the PM (Patient taking differently: Take 1,000 mg by mouth every evening. ) 180 tablet 1  . ONETOUCH DELICA LANCETS 73U MISC use as directed test 1-2 times a day  0  .  pantoprazole (PROTONIX) 40 MG tablet Take 1 tablet (40 mg total) by mouth daily. 30 tablet 2  . vitamin C (VITAMIN C) 500 MG tablet Take 1 tablet (500 mg total) by mouth 2 (two) times daily. 60 tablet 0   No current facility-administered medications on file prior to visit.    Review of Systems  Constitutional: Negative for unusual diaphoresis or night sweats HENT: Negative for ear swelling or discharge Eyes: Negative for worsening visual haziness  Respiratory: Negative for choking and stridor.   Gastrointestinal: Negative for distension or worsening  eructation Genitourinary: Negative for retention or change in urine volume.  Musculoskeletal: Negative for other MSK pain or swelling Skin: Negative for color change and worsening wound Neurological: Negative for tremors and numbness other than noted  Psychiatric/Behavioral: Negative for decreased concentration or agitation other than above       Objective:   Physical Exam BP (!) 142/84   Pulse 78   Temp 98 F (36.7 C) (Oral)   Resp 20   Wt 208 lb (94.3 kg)   SpO2 96%   BMI 39.30 kg/m  VS noted,  Constitutional: Pt appears in no apparent distress HENT: Head: NCAT.  Right Ear: External ear normal.  Left Ear: External ear normal.  Eyes: . Pupils are equal, round, and reactive to light. Conjunctivae and EOM are normal Neck: Normal range of motion. Neck supple.  Cardiovascular: Normal rate and regular rhythm.   Pulmonary/Chest: Effort normal and breath sounds without rales or wheezing.  Abd:  Soft, NT, ND, + BS Neurological: Pt is alert. Not confused , motor grossly intact Skin: Skin is warm. No rash, 1+ bilat LE edema to knees Psychiatric: Pt behavior is normal. No agitation.   Recent echo May 18, 2014 - summary Impressions:  - Compared to a prior study in 2015, there is now moderate aortic   stenosis. AVA around 1.1-1.2 cm2. There is Stage 2 DD with   elevated LV filling pressure, moderate TR, RVSP 51 mmHg with a   dilated IVC.    Assessment & Plan:   

## 2015-09-01 NOTE — Progress Notes (Signed)
Pre visit review using our clinic review tool, if applicable. No additional management support is needed unless otherwise documented below in the visit note. 

## 2015-09-05 NOTE — Assessment & Plan Note (Signed)
Overcontrolled, ok to reduce lipitor to 40 qd,  to f/u any worsening symptoms or concerns Lab Results  Component Value Date   LDLCALC 18 06/01/2015

## 2015-09-05 NOTE — Assessment & Plan Note (Signed)
Vs pulm htn, for lasix 20 qd, f/u bmp in 2 wks, daily wts

## 2015-09-05 NOTE — Assessment & Plan Note (Signed)
stable overall by history and exam, recent data reviewed with pt, and pt to continue medical treatment as before,  to f/u any worsening symptoms or concerns Lab Results  Component Value Date   HGBA1C 5.8 06/01/2015    

## 2015-09-05 NOTE — Assessment & Plan Note (Signed)
Also to add losartan 25 qd for renoprotectie,  to f/u any worsening symptoms or concerns

## 2015-09-14 ENCOUNTER — Telehealth: Payer: Self-pay | Admitting: Emergency Medicine

## 2015-09-14 MED ORDER — CLONAZEPAM 0.5 MG PO TABS: 0.5000 mg | ORAL_TABLET | Freq: Two times a day (BID) | ORAL | 1 refills | Status: DC | PRN

## 2015-09-14 NOTE — Telephone Encounter (Signed)
Received refill request for Clonazepam 0.5 mg tab. Do not see on current med list. Please advise.

## 2015-09-14 NOTE — Telephone Encounter (Signed)
Done hardcopy to Corinne  

## 2015-09-15 NOTE — Telephone Encounter (Signed)
Medication refill faxed to pharmacy  

## 2015-09-17 ENCOUNTER — Encounter: Payer: Self-pay | Admitting: Internal Medicine

## 2015-09-17 ENCOUNTER — Other Ambulatory Visit (INDEPENDENT_AMBULATORY_CARE_PROVIDER_SITE_OTHER): Payer: Medicare Other

## 2015-09-17 DIAGNOSIS — I5032 Chronic diastolic (congestive) heart failure: Secondary | ICD-10-CM | POA: Diagnosis not present

## 2015-09-17 DIAGNOSIS — D509 Iron deficiency anemia, unspecified: Secondary | ICD-10-CM

## 2015-09-17 LAB — BASIC METABOLIC PANEL
BUN: 20 mg/dL (ref 6–23)
CO2: 32 mEq/L (ref 19–32)
CREATININE: 0.75 mg/dL (ref 0.40–1.20)
Calcium: 10 mg/dL (ref 8.4–10.5)
Chloride: 105 mEq/L (ref 96–112)
GFR: 79.65 mL/min (ref >60.00)
GLUCOSE: 98 mg/dL (ref 70–99)
POTASSIUM: 4.4 meq/L (ref 3.5–5.1)
Sodium: 140 mEq/L (ref 135–145)

## 2015-09-17 LAB — CBC WITH DIFFERENTIAL/PLATELET
BASOS ABS: 0 10*3/uL (ref 0.0–0.1)
Basophils Relative: 0.5 % (ref 0.0–3.0)
EOS ABS: 0.1 10*3/uL (ref 0.0–0.7)
Eosinophils Relative: 2.4 % (ref 0.0–5.0)
HCT: 34.6 % — ABNORMAL LOW (ref 36.0–46.0)
Hemoglobin: 11.8 g/dL — ABNORMAL LOW (ref 12.0–15.0)
LYMPHS ABS: 2.1 10*3/uL (ref 0.7–4.0)
Lymphocytes Relative: 37.5 % (ref 12.0–46.0)
MCHC: 34.1 g/dL (ref 30.0–36.0)
MCV: 94.5 fl (ref 78.0–100.0)
MONOS PCT: 8.2 % (ref 3.0–12.0)
Monocytes Absolute: 0.5 10*3/uL (ref 0.1–1.0)
NEUTROS ABS: 2.9 10*3/uL (ref 1.4–7.7)
NEUTROS PCT: 51.4 % (ref 43.0–77.0)
PLATELETS: 197 10*3/uL (ref 150.0–400.0)
RBC: 3.66 Mil/uL — ABNORMAL LOW (ref 3.87–5.11)
RDW: 13.6 % (ref 11.5–15.5)
WBC: 5.6 10*3/uL (ref 4.0–10.5)

## 2015-10-22 ENCOUNTER — Other Ambulatory Visit (INDEPENDENT_AMBULATORY_CARE_PROVIDER_SITE_OTHER): Payer: Medicare Other

## 2015-10-22 ENCOUNTER — Encounter: Payer: Self-pay | Admitting: Gastroenterology

## 2015-10-22 DIAGNOSIS — D509 Iron deficiency anemia, unspecified: Secondary | ICD-10-CM | POA: Diagnosis not present

## 2015-10-22 LAB — CBC WITH DIFFERENTIAL/PLATELET
BASOS ABS: 0 10*3/uL (ref 0.0–0.1)
BASOS PCT: 0.3 % (ref 0.0–3.0)
EOS PCT: 1.6 % (ref 0.0–5.0)
Eosinophils Absolute: 0.1 10*3/uL (ref 0.0–0.7)
HEMATOCRIT: 32.4 % — AB (ref 36.0–46.0)
Hemoglobin: 10.9 g/dL — ABNORMAL LOW (ref 12.0–15.0)
LYMPHS PCT: 34.4 % (ref 12.0–46.0)
Lymphs Abs: 1.9 10*3/uL (ref 0.7–4.0)
MCHC: 33.7 g/dL (ref 30.0–36.0)
MCV: 94.3 fl (ref 78.0–100.0)
MONOS PCT: 7.6 % (ref 3.0–12.0)
Monocytes Absolute: 0.4 10*3/uL (ref 0.1–1.0)
NEUTROS ABS: 3.1 10*3/uL (ref 1.4–7.7)
Neutrophils Relative %: 56.1 % (ref 43.0–77.0)
PLATELETS: 208 10*3/uL (ref 150.0–400.0)
RBC: 3.44 Mil/uL — ABNORMAL LOW (ref 3.87–5.11)
RDW: 13.9 % (ref 11.5–15.5)
WBC: 5.6 10*3/uL (ref 4.0–10.5)

## 2015-10-22 LAB — IBC PANEL
Iron: 63 ug/dL (ref 42–145)
SATURATION RATIOS: 15.8 % — AB (ref 20.0–50.0)
Transferrin: 285 mg/dL (ref 212.0–360.0)

## 2015-11-16 ENCOUNTER — Telehealth: Payer: Self-pay | Admitting: *Deleted

## 2015-11-16 MED ORDER — CLONAZEPAM 0.5 MG PO TABS: 0.5000 mg | ORAL_TABLET | Freq: Two times a day (BID) | ORAL | 2 refills | Status: DC | PRN

## 2015-11-16 NOTE — Telephone Encounter (Signed)
Left msg on triage needing refill on her Clonazepam.../lmb

## 2015-11-16 NOTE — Telephone Encounter (Signed)
Done hardcopy to Corinne  

## 2015-11-16 NOTE — Telephone Encounter (Signed)
Rx has been faxed to [facility]...Raechel Chute

## 2015-12-15 ENCOUNTER — Other Ambulatory Visit: Payer: Self-pay | Admitting: *Deleted

## 2015-12-15 MED ORDER — METFORMIN HCL 1000 MG PO TABS: ORAL_TABLET | ORAL | 0 refills | Status: DC

## 2015-12-21 ENCOUNTER — Ambulatory Visit: Payer: Medicare Other

## 2015-12-28 ENCOUNTER — Ambulatory Visit (INDEPENDENT_AMBULATORY_CARE_PROVIDER_SITE_OTHER): Payer: Medicare Other

## 2015-12-28 DIAGNOSIS — Z23 Encounter for immunization: Secondary | ICD-10-CM

## 2016-02-08 ENCOUNTER — Telehealth: Payer: Self-pay | Admitting: *Deleted

## 2016-02-08 MED ORDER — CLONAZEPAM 0.5 MG PO TABS: 0.5000 mg | ORAL_TABLET | Freq: Two times a day (BID) | ORAL | 2 refills | Status: DC | PRN

## 2016-02-08 NOTE — Telephone Encounter (Signed)
Done hardcopy to [facility], but to fill Feb 14, 2016

## 2016-02-08 NOTE — Telephone Encounter (Signed)
Rec'd call pt requesting refill on her Clonazepam to be sent to [facility]...Raechel Chute

## 2016-02-08 NOTE — Telephone Encounter (Signed)
Faxed to pharmacy

## 2016-02-14 NOTE — Telephone Encounter (Signed)
Rec'd call from pharmacy requesting status on clonazepam refill. Inform Mary Mccarthy per chart rx was faxed to them on Friday. She states they never received script. Gave MD authorization for clonazepam.../lmb

## 2016-03-06 ENCOUNTER — Other Ambulatory Visit (INDEPENDENT_AMBULATORY_CARE_PROVIDER_SITE_OTHER): Payer: Medicare Other

## 2016-03-06 DIAGNOSIS — Z0001 Encounter for general adult medical examination with abnormal findings: Secondary | ICD-10-CM

## 2016-03-06 DIAGNOSIS — E119 Type 2 diabetes mellitus without complications: Secondary | ICD-10-CM

## 2016-03-06 LAB — CBC WITH DIFFERENTIAL/PLATELET
BASOS ABS: 0 10*3/uL (ref 0.0–0.1)
BASOS PCT: 0.6 % (ref 0.0–3.0)
EOS ABS: 0.1 10*3/uL (ref 0.0–0.7)
Eosinophils Relative: 2.6 % (ref 0.0–5.0)
HEMATOCRIT: 35.2 % — AB (ref 36.0–46.0)
HEMOGLOBIN: 11.7 g/dL — AB (ref 12.0–15.0)
LYMPHS PCT: 38.5 % (ref 12.0–46.0)
Lymphs Abs: 1.8 10*3/uL (ref 0.7–4.0)
MCHC: 33.1 g/dL (ref 30.0–36.0)
MCV: 91.3 fl (ref 78.0–100.0)
MONO ABS: 0.5 10*3/uL (ref 0.1–1.0)
Monocytes Relative: 9.6 % (ref 3.0–12.0)
Neutro Abs: 2.3 10*3/uL (ref 1.4–7.7)
Neutrophils Relative %: 48.7 % (ref 43.0–77.0)
Platelets: 231 10*3/uL (ref 150.0–400.0)
RBC: 3.86 Mil/uL — AB (ref 3.87–5.11)
RDW: 14.6 % (ref 11.5–15.5)
WBC: 4.8 10*3/uL (ref 4.0–10.5)

## 2016-03-06 LAB — HEPATIC FUNCTION PANEL
ALBUMIN: 4 g/dL (ref 3.5–5.2)
ALT: 11 U/L (ref 0–35)
AST: 13 U/L (ref 0–37)
Alkaline Phosphatase: 59 U/L (ref 39–117)
BILIRUBIN TOTAL: 1 mg/dL (ref 0.2–1.2)
Bilirubin, Direct: 0.2 mg/dL (ref 0.0–0.3)
Total Protein: 7 g/dL (ref 6.0–8.3)

## 2016-03-06 LAB — BASIC METABOLIC PANEL
BUN: 17 mg/dL (ref 6–23)
CHLORIDE: 106 meq/L (ref 96–112)
CO2: 30 meq/L (ref 19–32)
Calcium: 10.2 mg/dL (ref 8.4–10.5)
Creatinine, Ser: 0.77 mg/dL (ref 0.40–1.20)
GFR: 77.17 mL/min (ref >60.00)
Glucose, Bld: 94 mg/dL (ref 70–99)
POTASSIUM: 5.1 meq/L (ref 3.5–5.1)
Sodium: 141 mEq/L (ref 135–145)

## 2016-03-06 LAB — URINALYSIS, ROUTINE W REFLEX MICROSCOPIC
Bilirubin Urine: NEGATIVE
HGB URINE DIPSTICK: NEGATIVE
Ketones, ur: NEGATIVE
Leukocytes, UA: NEGATIVE
NITRITE: NEGATIVE
RBC / HPF: NONE SEEN (ref 0–?)
Specific Gravity, Urine: <=1.005 — AB (ref 1.000–1.030)
TOTAL PROTEIN, URINE-UPE24: NEGATIVE
UROBILINOGEN UA: 0.2 (ref 0.0–1.0)
Urine Glucose: NEGATIVE
WBC UA: NONE SEEN (ref 0–?)
pH: 7 (ref 5.0–8.0)

## 2016-03-06 LAB — LIPID PANEL
CHOL/HDL RATIO: 3
Cholesterol: 78 mg/dL (ref 0–200)
HDL: 30.3 mg/dL — AB (ref >39.00)
LDL Cholesterol: 33 mg/dL (ref 0–99)
NONHDL: 47.99
TRIGLYCERIDES: 76 mg/dL (ref 0.0–149.0)
VLDL: 15.2 mg/dL (ref 0.0–40.0)

## 2016-03-06 LAB — MICROALBUMIN / CREATININE URINE RATIO
CREATININE, U: 49.3 mg/dL
MICROALB/CREAT RATIO: 4.7 mg/g (ref 0.0–30.0)
Microalb, Ur: 2.3 mg/dL — ABNORMAL HIGH (ref 0.0–1.9)

## 2016-03-06 LAB — TSH: TSH: 3.48 u[IU]/mL (ref 0.35–4.50)

## 2016-03-06 LAB — HEMOGLOBIN A1C: HEMOGLOBIN A1C: 6.3 % (ref 4.6–6.5)

## 2016-03-09 ENCOUNTER — Ambulatory Visit (INDEPENDENT_AMBULATORY_CARE_PROVIDER_SITE_OTHER)
Admission: RE | Admit: 2016-03-09 | Discharge: 2016-03-09 | Disposition: A | Discharge status: Home / Self Care | Payer: Medicare Other | Source: Ambulatory Visit | Attending: Internal Medicine | Admitting: Internal Medicine

## 2016-03-09 ENCOUNTER — Ambulatory Visit (INDEPENDENT_AMBULATORY_CARE_PROVIDER_SITE_OTHER): Payer: Medicare Other | Admitting: Internal Medicine

## 2016-03-09 VITALS — BP 140/80 | HR 80 | Resp 20 | Wt 202.0 lb

## 2016-03-09 DIAGNOSIS — Z0000 Encounter for general adult medical examination without abnormal findings: Secondary | ICD-10-CM

## 2016-03-09 DIAGNOSIS — E119 Type 2 diabetes mellitus without complications: Secondary | ICD-10-CM

## 2016-03-09 DIAGNOSIS — E2839 Other primary ovarian failure: Secondary | ICD-10-CM

## 2016-03-09 NOTE — Assessment & Plan Note (Signed)
stable overall by history and exam, recent data reviewed with pt, and pt to continue medical treatment as before,  to f/u any worsening symptoms or concerns Lab Results  Component Value Date   HGBA1C 6.3 03/06/2016

## 2016-03-09 NOTE — Progress Notes (Signed)
Subjective:    Patient ID: Mary Mccarthy, female    DOB: April 16, 1940, 76 y.o.   MRN: [id number]  HPI  Here for wellness and f/u;  Overall doing ok;  Pt denies Chest pain, worsening SOB, DOE, wheezing, orthopnea, PND, worsening LE edema, palpitations, dizziness or syncope except for minor venous insufficiency type swelling later in the day. .  Pt denies neurological change such as new headache, facial or extremity weakness.  Pt denies polydipsia, polyuria, or low sugar symptoms. Pt states overall good compliance with treatment and medications, good tolerability, and has been trying to follow appropriate diet.  Pt denies worsening depressive symptoms, suicidal ideation or panic. No fever, night sweats, or other constitutional symptoms.  Pt states good ability with ADL's, has low fall risk, home safety reviewed and adequate, no other significant changes in hearing or vision (though does have mild low vision and nephew drives her), and only occasionally active with exercise.Overall much less active than she used to be, appetite slowing, has lost several lbs.  Wt Readings from Last 3 Encounters:  03/09/16 202 lb (91.6 kg)  09/01/15 208 lb (94.3 kg)  07/15/15 212 lb (96.2 kg)   BP Readings from Last 3 Encounters:  03/09/16 140/80  09/01/15 (!) 142/84  07/15/15 (!) 157/69  Has not seen optho recently due to cost and insurance coverage.  Due for DXA.  Taking klonopin more on regular basis due to increased symptoms, gets frustrated and agitated sometimes per pt.  Also s/p egd/colon per GI with AVM for anemia.  Past Medical History:  Diagnosis Date  . ALLERGIC RHINITIS 06/24/2007  . Anemia   . AVM (arteriovenous malformation) of colon   . Blood transfusion without reported diagnosis 05/2015  . COLONIC POLYPS, HX OF 06/24/2007  . DEPRESSION 10/08/2006  . DIABETES MELLITUS, TYPE II 10/05/2006  . GERD 06/24/2007  . Heart murmur   . HYPERLIPIDEMIA 10/08/2006  . HYPERTENSION 10/08/2006  . INSOMNIA 09/24/2008  .  Left knee DJD   . Myocardial infarction 05/2015   pt states mild  . PEPTIC ULCER DISEASE 10/08/2006   Past Surgical History:  Procedure Laterality Date  . APPENDECTOMY    . BREAST BIOPSY    . CARDIAC CATHETERIZATION N/A 05/21/2015   Procedure: Left Heart Cath and Coronary Angiography;  Surgeon: Leonie Man, MD;  Location: [place] [facility];  Service: Cardiovascular;  Laterality: N/A;  . COLONOSCOPY N/A 05/20/2015   Procedure: COLONOSCOPY;  Surgeon: Manus Gunning, MD;  Location: [place];  Service: Gastroenterology;  Laterality: N/A;  . ESOPHAGOGASTRODUODENOSCOPY N/A 05/20/2015   Procedure: ESOPHAGOGASTRODUODENOSCOPY (EGD);  Surgeon: Manus Gunning, MD;  Location: [place];  Service: Gastroenterology;  Laterality: N/A;  . TONSILLECTOMY    . TUBAL LIGATION      reports that she has never smoked. She has never used smokeless tobacco. She reports that she does not drink alcohol or use drugs. family history includes Alcohol abuse in her other; Arthritis in her other; Cancer in her mother; Depression in her other; Diabetes in her other; Heart disease in her other; Hyperlipidemia in her other; Hypertension in her other; Stroke in her other. Allergies  Allergen Reactions  . Ibuprofen Other (See Comments)    Bleeding events   Current Outpatient Prescriptions on File Prior to Visit  Medication Sig Dispense Refill  . acetaminophen (TYLENOL) 500 MG tablet Take 500 mg by mouth every 6 (six) hours as needed for moderate pain.    Marland Kitchen atorvastatin (LIPITOR) 40 MG tablet  Take 1 tablet (40 mg total) by mouth daily. 90 tablet 3  . Blood Glucose Monitoring Suppl (ONE TOUCH ULTRA 2) w/Device KIT Use as directed daily 1 each 0  . clonazePAM (KLONOPIN) 0.5 MG tablet Take 1 tablet (0.5 mg total) by mouth 2 (two) times daily as needed for anxiety. 60 tablet 2  . glimepiride (AMARYL) 2 MG tablet Take 1 tablet (2 mg total) by mouth daily before breakfast. 30 tablet 4  . glucose blood (ONE  TOUCH ULTRA TEST) test strip Use as instructed 100 each 12  . metFORMIN (GLUCOPHAGE) 1000 MG tablet 1 tab by mouth in the AM,and 1/2 tab in the PM. Need ov for future refills 45 tablet 0  . ONETOUCH DELICA LANCETS 14G MISC use as directed test 1-2 times a day  0  . pantoprazole (PROTONIX) 40 MG tablet Take 1 tablet (40 mg total) by mouth daily. 30 tablet 2  . vitamin C (VITAMIN C) 500 MG tablet Take 1 tablet (500 mg total) by mouth 2 (two) times daily. 60 tablet 0  . losartan (COZAAR) 25 MG tablet Take 1 tablet (25 mg total) by mouth daily. (Patient not taking: Reported on 03/09/2016) 90 tablet 3   No current facility-administered medications on file prior to visit.     Review of Systems Constitutional: Negative for increased diaphoresis, or other activity, appetite or siginficant weight change other than noted HENT: Negative for worsening hearing loss, ear pain, facial swelling, mouth sores and neck stiffness.   Eyes: Negative for other worsening pain, redness or visual disturbance.  Respiratory: Negative for choking or stridor Cardiovascular: Negative for other chest pain and palpitations.  Gastrointestinal: Negative for worsening diarrhea, blood in stool, or abdominal distention Genitourinary: Negative for hematuria, flank pain or change in urine volume.  Musculoskeletal: Negative for myalgias or other joint complaints.  Skin: Negative for other color change and wound or drainage.  Neurological: Negative for syncope and numbness. other than noted Hematological: Negative for adenopathy. or other swelling Psychiatric/Behavioral: Negative for hallucinations, SI, self-injury, decreased concentration or other worsening agitation.  All other system neg per pt    Objective:   Physical Exam BP 140/80   Pulse 80   Resp 20   Wt 202 lb (91.6 kg)   SpO2 96%   BMI 38.17 kg/m  VS noted, obese Constitutional: Pt is oriented to person, place, and time. Appears well-developed and well-nourished,  in no significant distress Head: Normocephalic and atraumatic  Eyes: Conjunctivae and EOM are normal. Pupils are equal, round, and reactive to light Right Ear: External ear normal.  Left Ear: External ear normal Nose: Nose normal.  Mouth/Throat: Oropharynx is clear and moist  Neck: Normal range of motion. Neck supple. No JVD present. No tracheal deviation present or significant neck LA or mass Cardiovascular: Normal rate, regular rhythm, normal heart sounds and intact distal pulses.   Pulmonary/Chest: Effort normal and breath sounds without rales or wheezing  Abdominal: Soft. Bowel sounds are normal. NT. No HSM  Musculoskeletal: Normal range of motion. Exhibits no edema Lymphadenopathy: Has no cervical adenopathy.  Neurological: Pt is alert and oriented to person, place, and time. Pt has normal reflexes. No cranial nerve deficit. Motor grossly intact Skin: Skin is warm and dry. No rash noted or new ulcers Psychiatric:  Has normal mood and affect. Behavior is normal.  No other new exam findings      Assessment & Plan:

## 2016-03-09 NOTE — Assessment & Plan Note (Signed)

## 2016-03-09 NOTE — Progress Notes (Signed)
Pre visit review using our clinic review tool, if applicable. No additional management support is needed unless otherwise documented below in the visit note. 

## 2016-03-09 NOTE — Patient Instructions (Addendum)
Please continue all other medications as before, and refills have been done if requested.  Please have the pharmacy call with any other refills you may need.  Please continue your efforts at being more active, low cholesterol diet, and weight control.  You are otherwise up to date with prevention measures today.  Please keep your appointments with your specialists as you may have planned  Please remember to see your eye doctor once per year.  Please schedule the bone density test before leaving today at the scheduling desk (where you check out)  Please return in 6 months, or sooner if needed, with Lab testing done 3-5 days before

## 2016-03-10 ENCOUNTER — Encounter: Payer: Self-pay | Admitting: Internal Medicine

## 2016-03-10 ENCOUNTER — Other Ambulatory Visit: Payer: Self-pay | Admitting: Internal Medicine

## 2016-03-10 DIAGNOSIS — M810 Age-related osteoporosis without current pathological fracture: Secondary | ICD-10-CM | POA: Insufficient documentation

## 2016-03-10 HISTORY — DX: Age-related osteoporosis without current pathological fracture: M81.0

## 2016-03-10 MED ORDER — ALENDRONATE SODIUM 70 MG PO TABS: 70.0000 mg | ORAL_TABLET | ORAL | 11 refills | Status: DC

## 2016-03-14 ENCOUNTER — Telehealth: Payer: Self-pay

## 2016-03-14 MED ORDER — GLIMEPIRIDE 2 MG PO TABS: 2.0000 mg | ORAL_TABLET | Freq: Every day | ORAL | 4 refills | Status: DC

## 2016-03-14 MED ORDER — METFORMIN HCL 1000 MG PO TABS: ORAL_TABLET | ORAL | 0 refills | Status: DC

## 2016-03-14 NOTE — Telephone Encounter (Signed)
Medication refills sent to pharmacy 

## 2016-03-15 ENCOUNTER — Other Ambulatory Visit: Payer: Self-pay | Admitting: Internal Medicine

## 2016-03-17 ENCOUNTER — Telehealth: Payer: Self-pay | Admitting: Internal Medicine

## 2016-03-17 NOTE — Telephone Encounter (Signed)
Called patient to schedule awv. Left msg for patient to call office to schedule appt.  °

## 2016-04-06 ENCOUNTER — Other Ambulatory Visit: Payer: Self-pay | Admitting: Internal Medicine

## 2016-04-06 NOTE — Telephone Encounter (Signed)
klonopin refill too soon  should still have one refill from jan 2018 rx

## 2016-05-18 ENCOUNTER — Other Ambulatory Visit: Payer: Self-pay | Admitting: Internal Medicine

## 2016-05-18 NOTE — Telephone Encounter (Signed)
Done

## 2016-05-18 NOTE — Telephone Encounter (Signed)
Done hardcopy to Shirron  

## 2016-06-14 ENCOUNTER — Other Ambulatory Visit: Payer: Self-pay

## 2016-06-14 NOTE — Patient Outreach (Signed)
[facility]) Care Management  06/14/2016  Danyal Monclova 12-21-1940 [id number]  Medication Adherence to Mrs. Ceylin Stieglitz call Mrs. Bazzle because she is past due on her atorvastatin 80 mg patient already pick up medication from [facility]  On the first of the month 06/06/16 patient does not want 90 days supply .   Lillia Abed CPhT Pharmacy Technician [facility] Management Direct Dial [phone]  Fax [phone] [email or web address]

## 2016-06-29 ENCOUNTER — Other Ambulatory Visit: Payer: Self-pay | Admitting: Internal Medicine

## 2016-06-29 NOTE — Telephone Encounter (Signed)
Dr. Jonny Ruiz I do not see this medication on her med list. Please advise.

## 2016-08-01 ENCOUNTER — Other Ambulatory Visit: Payer: Self-pay | Admitting: Internal Medicine

## 2016-09-07 ENCOUNTER — Encounter: Payer: Self-pay | Admitting: Internal Medicine

## 2016-09-07 ENCOUNTER — Other Ambulatory Visit (INDEPENDENT_AMBULATORY_CARE_PROVIDER_SITE_OTHER): Payer: Medicare Other

## 2016-09-07 ENCOUNTER — Ambulatory Visit (INDEPENDENT_AMBULATORY_CARE_PROVIDER_SITE_OTHER): Payer: Medicare Other | Admitting: Internal Medicine

## 2016-09-07 ENCOUNTER — Ambulatory Visit: Payer: Medicare Other | Admitting: Internal Medicine

## 2016-09-07 VITALS — BP 164/87 | HR 64 | Resp 20 | Ht 61.0 in | Wt 205.0 lb

## 2016-09-07 DIAGNOSIS — I10 Essential (primary) hypertension: Secondary | ICD-10-CM

## 2016-09-07 DIAGNOSIS — Z0000 Encounter for general adult medical examination without abnormal findings: Secondary | ICD-10-CM | POA: Diagnosis not present

## 2016-09-07 DIAGNOSIS — E119 Type 2 diabetes mellitus without complications: Secondary | ICD-10-CM

## 2016-09-07 DIAGNOSIS — E785 Hyperlipidemia, unspecified: Secondary | ICD-10-CM | POA: Diagnosis not present

## 2016-09-07 LAB — BASIC METABOLIC PANEL
BUN: 12 mg/dL (ref 6–23)
CHLORIDE: 104 meq/L (ref 96–112)
CO2: 33 meq/L — AB (ref 19–32)
CREATININE: 0.71 mg/dL (ref 0.40–1.20)
Calcium: 10.2 mg/dL (ref 8.4–10.5)
GFR: 84.64 mL/min (ref >60.00)
GLUCOSE: 116 mg/dL — AB (ref 70–99)
Potassium: 4.5 mEq/L (ref 3.5–5.1)
Sodium: 140 mEq/L (ref 135–145)

## 2016-09-07 LAB — LIPID PANEL
CHOLESTEROL: 88 mg/dL (ref 0–200)
HDL: 31.7 mg/dL — AB (ref >39.00)
LDL Cholesterol: 38 mg/dL (ref 0–99)
NonHDL: 56.24
TRIGLYCERIDES: 90 mg/dL (ref 0.0–149.0)
Total CHOL/HDL Ratio: 3
VLDL: 18 mg/dL (ref 0.0–40.0)

## 2016-09-07 LAB — HEPATIC FUNCTION PANEL
ALBUMIN: 3.9 g/dL (ref 3.5–5.2)
ALT: 25 U/L (ref 0–35)
AST: 24 U/L (ref 0–37)
Alkaline Phosphatase: 49 U/L (ref 39–117)
BILIRUBIN DIRECT: 0.2 mg/dL (ref 0.0–0.3)
TOTAL PROTEIN: 7 g/dL (ref 6.0–8.3)
Total Bilirubin: 1.3 mg/dL — ABNORMAL HIGH (ref 0.2–1.2)

## 2016-09-07 LAB — HEMOGLOBIN A1C: HEMOGLOBIN A1C: 6.2 % (ref 4.6–6.5)

## 2016-09-07 NOTE — Patient Instructions (Addendum)
Please continue all other medications as before, and refills have been done if requested.  Please have the pharmacy call with any other refills you may need.  Please continue your efforts at being more active, low cholesterol diet, and weight control.  You are otherwise up to date with prevention measures today.  Please keep your appointments with your specialists as you may have planned  Please go to the LAB in the Basement (turn left off the elevator) for the tests to be done today  You will be contacted by phone if any changes need to be made immediately.  Otherwise, you will receive a letter about your results with an explanation, but please check with MyChart first.  Please remember to sign up for MyChart if you have not done so, as this will be important to you in the future with finding out test results, communicating by private email, and scheduling acute appointments online when needed.  Please return in 6 months, or sooner if needed, with Lab testing done 3-5 days before   Continue to eat heart healthy diet (full of fruits, vegetables, whole grains, lean protein, water--limit salt, fat, and sugar intake) and increase physical activity as tolerated.  Continue doing brain stimulating activities (puzzles, reading, adult coloring books, staying active) to keep memory sharp.   It is important to avoid accidents which may result in broken bones.  Here are a few ideas on how to make your home safer so you will be less likely to trip or fall.  1. Use nonskid mats or non slip strips in your shower or tub, on your bathroom floor and around sinks.  If you know that you have spilled water, wipe it up! 2. In the bathroom, it is important to have properly installed grab bars on the walls or on the edge of the tub.  Towel racks are NOT strong enough for you to hold onto or to pull on for support. 3. Stairs and hallways should have enough light.  Add lamps or night lights if you need ore  light. 4. It is good to have handrails on both sides of the stairs if possible.  Always fix broken handrails right away. 5. It is important to see the edges of steps.  Paint the edges of outdoor steps white so you can see them better.  Put colored tape on the edge of inside steps. 6. Throw-rugs are dangerous because they can slide.  Removing the rugs is the best idea, but if they must stay, add adhesive carpet tape to prevent slipping. 7. Do not keep things on stairs or in the halls.  Remove small furniture that blocks the halls as it may cause you to trip.  Keep telephone and electrical cords out of the way where you walk. 8. Always were sturdy, rubber-soled shoes for good support.  Never wear just socks, especially on the stairs.  Socks may cause you to slip or fall.  Do not wear full-length housecoats as you can easily trip on the bottom.  9. Place the things you use the most on the shelves that are the easiest to reach.  If you use a stepstool, make sure it is in good condition.  If you feel unsteady, DO NOT climb, ask for help. 10. If a health professional advises you to use a cane or walker, do not be ashamed.  These items can keep you from falling and breaking your bones.  Insomnia Insomnia is a sleep disorder that makes it difficult  to fall asleep or to stay asleep. Insomnia can cause tiredness (fatigue), low energy, difficulty concentrating, mood swings, and poor performance at work or school. There are three different ways to classify insomnia:  Difficulty falling asleep.  Difficulty staying asleep.  Waking up too early in the morning.  Any type of insomnia can be long-term (chronic) or short-term (acute). Both are common. Short-term insomnia usually lasts for three months or less. Chronic insomnia occurs at least three times a week for longer than three months. What are the causes? Insomnia may be caused by another condition, situation, or substance, such as:  Anxiety.  Certain  medicines.  Gastroesophageal reflux disease (GERD) or other gastrointestinal conditions.  Asthma or other breathing conditions.  Restless legs syndrome, sleep apnea, or other sleep disorders.  Chronic pain.  Menopause. This may include hot flashes.  Stroke.  Abuse of alcohol, tobacco, or illegal drugs.  Depression.  Caffeine.  Neurological disorders, such as Alzheimer disease.  An overactive thyroid (hyperthyroidism).  The cause of insomnia may not be known. What increases the risk? Risk factors for insomnia include:  Gender. Women are more commonly affected than men.  Age. Insomnia is more common as you get older.  Stress. This may involve your professional or personal life.  Income. Insomnia is more common in people with lower income.  Lack of exercise.  Irregular work schedule or night shifts.  Traveling between different time zones.  What are the signs or symptoms? If you have insomnia, trouble falling asleep or trouble staying asleep is the main symptom. This may lead to other symptoms, such as:  Feeling fatigued.  Feeling nervous about going to sleep.  Not feeling rested in the morning.  Having trouble concentrating.  Feeling irritable, anxious, or depressed.  How is this treated? Treatment for insomnia depends on the cause. If your insomnia is caused by an underlying condition, treatment will focus on addressing the condition. Treatment may also include:  Medicines to help you sleep.  Counseling or therapy.  Lifestyle adjustments.  Follow these instructions at home:  Take medicines only as directed by your health care provider.  Keep regular sleeping and waking hours. Avoid naps.  Keep a sleep diary to help you and your health care provider figure out what could be causing your insomnia. Include: ? When you sleep. ? When you wake up during the night. ? How well you sleep. ? How rested you feel the next day. ? Any side effects of  medicines you are taking. ? What you eat and drink.  Make your bedroom a comfortable place where it is easy to fall asleep: ? Put up shades or special blackout curtains to block light from outside. ? Use a white noise machine to block noise. ? Keep the temperature cool.  Exercise regularly as directed by your health care provider. Avoid exercising right before bedtime.  Use relaxation techniques to manage stress. Ask your health care provider to suggest some techniques that may work well for you. These may include: ? Breathing exercises. ? Routines to release muscle tension. ? Visualizing peaceful scenes.  Cut back on alcohol, caffeinated beverages, and cigarettes, especially close to bedtime. These can disrupt your sleep.  Do not overeat or eat spicy foods right before bedtime. This can lead to digestive discomfort that can make it hard for you to sleep.  Limit screen use before bedtime. This includes: ? Watching TV. ? Using your smartphone, tablet, and computer.  Stick to a routine. This can help  you fall asleep faster. Try to do a quiet activity, brush your teeth, and go to bed at the same time each night.  Get out of bed if you are still awake after 15 minutes of trying to sleep. Keep the lights down, but try reading or doing a quiet activity. When you feel sleepy, go back to bed.  Make sure that you drive carefully. Avoid driving if you feel very sleepy.  Keep all follow-up appointments as directed by your health care provider. This is important. Contact a health care provider if:  You are tired throughout the day or have trouble in your daily routine due to sleepiness.  You continue to have sleep problems or your sleep problems get worse. Get help right away if:  You have serious thoughts about hurting yourself or someone else. This information is not intended to replace advice given to you by your health care provider. Make sure you discuss any questions you have with  your health care provider. Document Released: 01/21/2000 Document Revised: 06/25/2015 Document Reviewed: 10/24/2013 Elsevier Interactive Patient Education  2018 [employer].    Eating Healthy on a Budget There are many ways to save money at the grocery store and continue to eat healthy. You can be successful if you plan your meals according to your budget, purchase according to your budget and grocery list, and prepare food yourself. How can I buy more food on a limited budget? Plan  Plan meals and snacks according to a grocery list and budget you create.  Look for recipes where you can cook once and make enough food for two meals.  Include meals that will "stretch" more expensive foods such as stews, casseroles, and stir-fry dishes.  Make a grocery list and make sure to bring it with you to the store. If you have a smart phone, you could use your phone to create your shopping list. Purchase  When grocery shopping, buy only the items on your grocery list and go only to the areas of the store that have the items on your list. Prepare  Some meal items can be prepared in advance. Pre-cook on days when you have extra time.  Make extra food (such as by doubling recipes) and freeze the extras in meal-sized containers or in individual portions for fast meals and snacks.  Use leftovers in your meal plan for the week.  Try some meatless meals or try "no cook" meals like salads.  When you come home from the grocery store, wash and prepare your fruits and vegetables so they are ready to use and eat. This will help reduce food waste. How can I buy more food on a limited budget? Try these tips the next time you go shopping:  [place] store brands or generic brands.  Use coupons only for foods and brands you normally buy. Avoid buying items you wouldn't normally buy simply because they are on sale.  Check online and in newspapers for weekly deals.  Buy healthy items from the bulk bins when  available, such as herbs, spices, flours, pastas, nuts, and dried fruit.  Buy fruits and vegetables that are in season. Prices are usually lower on in-season produce.  Compare and contrast different items. You can do this by looking at the unit price on the price tag. Use it to compare different brands and sizes to find out which item is the best deal.  Choose naturally low-cost healthy items, such as carrots, potatoes, apples, bananas, and oranges. Dried or canned  beans are a low-cost protein source.  Buy in bulk and freeze extra food. Items you can buy in bulk include meats, fish, poultry, frozen fruits, and frozen vegetables.  Limit the purchase of prepared or "ready-to-eat" foods, such as pre-cut fruits and vegetables and pre-made salads.  If possible, shop around to discover which grocery store offers the best prices. Some stores charge much more than other stores for the same items.  Do not shop when you are hungry. If you shop while hungry, It may be hard to stick to your list and budget.  Stick to your list and resist impulse buys. Treat your list as your official plan for the week.  Buy a variety of vegetables and fruit by purchasing fresh, frozen, and canned items.  Look beyond eye level. Foods at eye level (adult or child eye level) are more expensive. Look at the top and bottom shelves for deals.  Be efficient with your time when shopping. The more time you spend at the store, the more money you are likely to spend.  Consider other retailers such as dollar stores, larger [employer], local fruit and vegetable stands, and farmers markets.  What are some tips for less expensive food substitutions? When choosing more expensive foods like meats and dairy, try these tips to save money:  Choose cheaper cuts of meat, such as bone-in chicken thighs and drumsticks instead skinless and boneless chicken. When you are ready to prepare the chicken, you can remove the skin yourself to  make it healthier.  Choose lean meats like chicken or [place]. When choosing ground beef, make sure it is lean ground beef (92% lean, 8% fat). If you do buy a fattier ground beef, drain the fat before eating.  Buy dried beans and peas, such as lentils, split peas, or kidney beans.  For seafood, choose canned tuna, salmon, or sardines.  Eggs are a low-cost source of protein.  Buy the larger tubs of yogurt instead of individual-sized containers.  Choose water instead of sodas and other sweetened beverages.  Skip buying chips, cookies, and other "junk food". These items are usually expensive, high in calories, and low in nutritional value.  How can I prepare the foods I buy in the healthiest way? Practice these tips for cooking foods in the healthiest way to reduce excess fat and calorie intake:  Steam, saute, grill, or bake foods instead of frying them.  Make sure half your plate is filled with fruits or vegetables. Choose from fresh, frozen, or canned fruits and vegetables. If eating canned, remember to rinse them before eating. This will remove any excess salt added for packaging.  Trim all fat from meat before cooking. Remove the skin from chicken or [place].  Spoon off fat from meat dishes once they have been chilled in the refrigerator and the fat has hardened on the top.  Use skim milk, low-fat milk, or evaporated skim milk when making cream sauces, soups, or puddings.  Substitute low-fat yogurt, sour cream, or cottage cheese for sour cream and mayonnaise in dips and dressings.  Try lemon juice, herbs, or spices to season food instead of salt, butter, or margarine.  This information is not intended to replace advice given to you by your health care provider. Make sure you discuss any questions you have with your health care provider. Document Released: 09/26/2013 Document Revised: 08/13/2015 Document Reviewed: 08/26/2013 Elsevier Interactive Patient Education  [employer].

## 2016-09-07 NOTE — Progress Notes (Signed)
Subjective:    Patient ID: Mary Mccarthy, female    DOB: 05-Nov-1940, 76 y.o.   MRN: [id number]  HPI  Here to f/u; overall doing ok,  Pt denies chest pain, increasing sob or doe, wheezing, orthopnea, PND, increased LE swelling, palpitations, dizziness or syncope.  Pt denies new neurological symptoms such as new headache, or facial or extremity weakness or numbness.  Pt denies polydipsia, polyuria, or low sugar episode.  Pt states overall good compliance with meds, mostly trying to follow appropriate diet, with wt overall stable,  but little exercise however. Wt Readings from Last 3 Encounters:  09/07/16 205 lb (93 kg)  03/09/16 202 lb (91.6 kg)  15-Sep-2015 208 lb (94.3 kg)   BP Readings from Last 3 Encounters:  09/07/16 (!) 164/87  03/09/16 140/80  2015/09/15 (!) 142/84  23 yo dog died with cancer 2 wks ago. Admits to less rigourous diet in last few wks. Wt Readings from Last 3 Encounters:  09/07/16 205 lb (93 kg)  03/09/16 202 lb (91.6 kg)  09-15-2015 208 lb (94.3 kg)  Pt states she was 98 lbs at 76 yo.  Past Medical History:  Diagnosis Date  . ALLERGIC RHINITIS 06/24/2007  . Anemia   . AVM (arteriovenous malformation) of colon   . Blood transfusion without reported diagnosis 05/2015  . COLONIC POLYPS, HX OF 06/24/2007  . DEPRESSION 10/08/2006  . DIABETES MELLITUS, TYPE II 10/05/2006  . GERD 06/24/2007  . Heart murmur   . HYPERLIPIDEMIA 10/08/2006  . HYPERTENSION 10/08/2006  . INSOMNIA 09/24/2008  . Left knee DJD   . Myocardial infarction ([place]) 05/2015   pt states mild  . Osteoporosis 03/10/2016  . PEPTIC ULCER DISEASE 10/08/2006   Past Surgical History:  Procedure Laterality Date  . APPENDECTOMY    . BREAST BIOPSY    . CARDIAC CATHETERIZATION N/A 05/21/2015   Procedure: Left Heart Cath and Coronary Angiography;  Surgeon: Leonie Man, MD;  Location: [facility];  Service: Cardiovascular;  Laterality: N/A;  . COLONOSCOPY N/A 05/20/2015   Procedure: COLONOSCOPY;  Surgeon: Manus Gunning, MD;  Location: [facility];  Service: Gastroenterology;  Laterality: N/A;  . ESOPHAGOGASTRODUODENOSCOPY N/A 05/20/2015   Procedure: ESOPHAGOGASTRODUODENOSCOPY (EGD);  Surgeon: Manus Gunning, MD;  Location: [facility];  Service: Gastroenterology;  Laterality: N/A;  . TONSILLECTOMY    . TUBAL LIGATION      reports that she has never smoked. She has never used smokeless tobacco. She reports that she does not drink alcohol or use drugs. family history includes Alcohol abuse in her other; Arthritis in her other; Cancer in her mother; Depression in her other; Diabetes in her other; Heart disease in her other; Hyperlipidemia in her other; Hypertension in her other; Stroke in her other. Allergies  Allergen Reactions  . Ibuprofen Other (See Comments)    Bleeding events   Current Outpatient Prescriptions on File Prior to Visit  Medication Sig Dispense Refill  . acetaminophen (TYLENOL) 500 MG tablet Take 500 mg by mouth every 6 (six) hours as needed for moderate pain.    Marland Kitchen alendronate (FOSAMAX) 70 MG tablet Take 1 tablet (70 mg total) by mouth every 7 (seven) days. Take with a full glass of water on an empty stomach. 4 tablet 11  . Blood Glucose Monitoring Suppl (ONE TOUCH ULTRA 2) w/Device KIT Use as directed daily 1 each 0  . clonazePAM (KLONOPIN) 0.5 MG tablet take 1 TABLET BY MOUTH TWICE DAILY AS NEEDED for anxiety 60 tablet  0  . glimepiride (AMARYL) 2 MG tablet Take 1 tablet (2 mg total) by mouth daily before breakfast. 30 tablet 4  . glucose blood (ONE TOUCH ULTRA TEST) test strip Use as instructed 100 each 12  . irbesartan (AVAPRO) 150 MG tablet Take 1 tablet (150 mg total) by mouth daily. 90 tablet 2  . ONETOUCH DELICA LANCETS 63K MISC use as directed test 1-2 times a day  0  . pantoprazole (PROTONIX) 40 MG tablet Take 1 tablet (40 mg total) by mouth daily. 30 tablet 2  . vitamin C (VITAMIN C) 500 MG tablet Take 1 tablet (500 mg total) by mouth 2 (two) times daily. 60  tablet 0  . metFORMIN (GLUCOPHAGE) 1000 MG tablet 1 tab by mouth in the AM,and 1/2 tab in the PM. Need ov for future refills 45 tablet 0   No current facility-administered medications on file prior to visit.    Review of Systems  Constitutional: Negative for other unusual diaphoresis or sweats HENT: Negative for ear discharge or swelling Eyes: Negative for other worsening visual disturbances Respiratory: Negative for stridor or other swelling  Gastrointestinal: Negative for worsening distension or other blood Genitourinary: Negative for retention or other urinary change Musculoskeletal: Negative for other MSK pain or swelling Skin: Negative for color change or other new lesions Neurological: Negative for worsening tremors and other numbness  Psychiatric/Behavioral: Negative for worsening agitation or other fatigue All other system neg per pt    Objective:   Physical Exam BP (!) 164/87 (BP Location: Right Arm, Cuff Size: Normal)   Pulse 64   Resp 20   Ht _0  (1.549 m)   Wt 205 lb (93 kg)   SpO2 98%   BMI 38.73 kg/m  VS noted,  Constitutional: Pt appears in NAD HENT: Head: NCAT.  Right Ear: External ear normal.  Left Ear: External ear normal.  Eyes: . Pupils are equal, round, and reactive to light. Conjunctivae and EOM are normal Nose: without d/c or deformity Neck: Neck supple. Gross normal ROM Cardiovascular: Normal rate and regular rhythm.   Pulmonary/Chest: Effort normal and breath sounds without rales or wheezing.  Neurological: Pt is alert. At baseline orientation, motor grossly intact Skin: Skin is warm. No rashes, other new lesions, no LE edema Psychiatric: Pt behavior is normal without agitation  No other exam findings Lab Results  Component Value Date   WBC 4.8 03/06/2016   HGB 11.7 (L) 03/06/2016   HCT 35.2 (L) 03/06/2016   PLT 231.0 03/06/2016   GLUCOSE 116 (H) 09/07/2016   CHOL 88 09/07/2016   TRIG 90.0 09/07/2016   HDL 31.70 (L) 09/07/2016   LDLCALC  38 09/07/2016   ALT 25 09/07/2016   AST 24 09/07/2016   NA 140 09/07/2016   K 4.5 09/07/2016   CL 104 09/07/2016   CREATININE 0.71 09/07/2016   BUN 12 09/07/2016   CO2 33 (H) 09/07/2016   TSH 3.48 03/06/2016   INR 1.18 05/21/2015   HGBA1C 6.2 09/07/2016   MICROALBUR 2.3 (H) 03/06/2016      Assessment & Plan:

## 2016-09-07 NOTE — Progress Notes (Signed)
Subjective:   Mary Mccarthy is a 76 y.o. female who presents for an Initial Medicare Annual Wellness Visit.  Review of Systems    No ROS.  Medicare Wellness Visit. Additional risk factors are reflected in the social history.   Cardiac Risk Factors include: advanced age (>85mn, >>49women);diabetes mellitus;dyslipidemia;hypertension;obesity (BMI >30kg/m2);sedentary lifestyle Sleep patterns: gets up 1 times nightly to void and sleeps 5-6 hours nightly.    Home Safety/Smoke Alarms: Feels safe in home. Smoke alarms in place.  Living environment; residence and Firearm Safety: 1-story house/ trailer, no firearms. Lives with family, no needs for DME, good support system Seat Belt Safety/Bike Helmet: Wears seat belt.   Counseling:   Eye Exam- appointment as patient can afford, last 2 years ago Dental- dentures  Female:   Pap- N/A      Mammo- Last 06/03/13,  BI-RADS category 1: negative       Dexa scan- Last 03/09/16, osteoporosis      CCS- Last 05/20/15, no recall due to age     Objective:    Today's Vitals   09/07/16 1009 09/07/16 1045  BP: (!) 160/98 (!) 164/87  Pulse: 68 64  Resp: 20   SpO2: 98%   Weight: 205 lb (93 kg)   Height: '5\' 1"'$  (1.549 m)    Body mass index is 38.73 kg/m.   Current Medications (verified) Outpatient Encounter Prescriptions as of 09/07/2016  Medication Sig  . acetaminophen (TYLENOL) 500 MG tablet Take 500 mg by mouth every 6 (six) hours as needed for moderate pain.  .Marland Kitchenalendronate (FOSAMAX) 70 MG tablet Take 1 tablet (70 mg total) by mouth every 7 (seven) days. Take with a full glass of water on an empty stomach.  .Marland Kitchenatorvastatin (LIPITOR) 80 MG tablet Take 80 mg by mouth daily.  . Blood Glucose Monitoring Suppl (ONE TOUCH ULTRA 2) w/Device KIT Use as directed daily  . clonazePAM (KLONOPIN) 0.5 MG tablet take 1 TABLET BY MOUTH TWICE DAILY AS NEEDED for anxiety  . glimepiride (AMARYL) 2 MG tablet Take 1 tablet (2 mg total) by mouth daily before breakfast.   . glucose blood (ONE TOUCH ULTRA TEST) test strip Use as instructed  . irbesartan (AVAPRO) 150 MG tablet Take 1 tablet (150 mg total) by mouth daily.  .Glory RosebushDELICA LANCETS 375TMISC use as directed test 1-2 times a day  . pantoprazole (PROTONIX) 40 MG tablet Take 1 tablet (40 mg total) by mouth daily.  . vitamin C (VITAMIN C) 500 MG tablet Take 1 tablet (500 mg total) by mouth 2 (two) times daily.  . metFORMIN (GLUCOPHAGE) 1000 MG tablet 1 tab by mouth in the AM,and 1/2 tab in the PM. Need ov for future refills  . [DISCONTINUED] atorvastatin (LIPITOR) 40 MG tablet Take 1 tablet (40 mg total) by mouth daily. (Patient not taking: Reported on 09/07/2016)  . [DISCONTINUED] atorvastatin (LIPITOR) 80 MG tablet Take 1 tablet (80 mg total) by mouth daily at 6 PM.  . [DISCONTINUED] glimepiride (AMARYL) 2 MG tablet Take 1 tablet (2 mg total) by mouth daily before breakfast.  . [DISCONTINUED] losartan (COZAAR) 25 MG tablet Take 1 tablet (25 mg total) by mouth daily. (Patient not taking: Reported on 03/09/2016)  . [DISCONTINUED] metFORMIN (GLUCOPHAGE) 1000 MG tablet 1 tab by mouth in the AM,and 1/2 tab in the PM. Need ov for future refills   No facility-administered encounter medications on file as of 09/07/2016.     Allergies (verified) Ibuprofen   History: Past Medical  History:  Diagnosis Date  . ALLERGIC RHINITIS 06/24/2007  . Anemia   . AVM (arteriovenous malformation) of colon   . Blood transfusion without reported diagnosis 05/2015  . COLONIC POLYPS, HX OF 06/24/2007  . DEPRESSION 10/08/2006  . DIABETES MELLITUS, TYPE II 10/05/2006  . GERD 06/24/2007  . Heart murmur   . HYPERLIPIDEMIA 10/08/2006  . HYPERTENSION 10/08/2006  . INSOMNIA 09/24/2008  . Left knee DJD   . Myocardial infarction ([place]) 05/2015   pt states mild  . Osteoporosis 03/10/2016  . PEPTIC ULCER DISEASE 10/08/2006   Past Surgical History:  Procedure Laterality Date  . APPENDECTOMY    . BREAST BIOPSY    . CARDIAC CATHETERIZATION N/A  05/21/2015   Procedure: Left Heart Cath and Coronary Angiography;  Surgeon: Leonie Man, MD;  Location: [facility];  Service: Cardiovascular;  Laterality: N/A;  . COLONOSCOPY N/A 05/20/2015   Procedure: COLONOSCOPY;  Surgeon: Manus Gunning, MD;  Location: [facility];  Service: Gastroenterology;  Laterality: N/A;  . ESOPHAGOGASTRODUODENOSCOPY N/A 05/20/2015   Procedure: ESOPHAGOGASTRODUODENOSCOPY (EGD);  Surgeon: Manus Gunning, MD;  Location: [facility];  Service: Gastroenterology;  Laterality: N/A;  . TONSILLECTOMY    . TUBAL LIGATION     Family History  Problem Relation Age of Onset  . Alcohol abuse Other   . Arthritis Other        DJD  . Hyperlipidemia Other   . Heart disease Other   . Stroke Other   . Hypertension Other   . Depression Other   . Diabetes Other   . Cancer Mother        ENT cancer   Social History   Occupational History  . retired - Freight forwarder Retired   Social History Main Topics  . Smoking status: Never Smoker  . Smokeless tobacco: Never Used  . Alcohol use No  . Drug use: No  . Sexual activity: Not on file    Tobacco Counseling Counseling given: Not Answered   Activities of Daily Living In your present state of health, do you have any difficulty performing the following activities: 09/07/2016  Hearing? Y  Vision? Y  Difficulty concentrating or making decisions? N  Walking or climbing stairs? N  Dressing or bathing? N  Doing errands, shopping? N  Preparing Food and eating ? N  Using the Toilet? N  In the past six months, have you accidently leaked urine? N  Do you have problems with loss of bowel control? N  Managing your Medications? N  Managing your Finances? N  Housekeeping or managing your Housekeeping? N  Some recent data might be hidden    Immunizations and Health Maintenance Immunization History  Administered Date(s) Administered  . Influenza Split 12/21/2010, 12/28/2011  . Influenza Whole  12/01/2005, 03/26/2009, 12/07/2009  . Influenza, High Dose Seasonal PF 11/19/2012, 11/18/2013, 02/18/2015, 12/28/2015  . Pneumococcal Conjugate-13 12/03/2012  . Pneumococcal Polysaccharide-23 11/06/2005  . Td 09/24/2008   Health Maintenance Due  Topic Date Due  . OPHTHALMOLOGY EXAM  10/04/1950  . FOOT EXAM  11/20/2015  . HEMOGLOBIN A1C  09/03/2016  . INFLUENZA VACCINE  09/06/2016    Patient Care Team: Biagio Borg, MD as PCP - General  Indicate any recent Medical Services you may have received from other than [facility] providers in the past year (date may be approximate).     Assessment:   This is a routine wellness examination for Mary Mccarthy. Physical assessment deferred to PCP.   Hearing/Vision screen Hearing  Screening Comments: Able to hear conversational tones w/o difficulty. No issues reported.  Passed whisper test Vision Screening Comments: Wears glasses  Dietary issues and exercise activities discussed: Current Exercise Habits: Home exercise routine, Type of exercise: walking, Time (Minutes): 20, Frequency (Times/Week): 6, Weekly Exercise (Minutes/Week): 120, Intensity: Mild  Diet (meal preparation, eat out, water intake, caffeinated beverages, dairy products, fruits and vegetables): in general, a "healthy" diet  , well balanced     Reviewed heart healthy and diabetic diet, encouraged patient to increase daily water intake. Diet education was provided via handout.  Goals    . Be as active and as independent as possible      Depression Screen PHQ 2/9 Scores 09/07/2016 03/09/2016 11/20/2014 11/18/2013 05/20/2013 11/19/2012  PHQ - 2 Score 1 1 0 0 0 1  PHQ- 9 Score 3 - - - - -    Fall Risk Fall Risk  09/07/2016 03/09/2016 11/20/2014 11/18/2013 05/20/2013  Falls in the past year? Yes Yes No Yes Yes  Number falls in past yr: 2 or more 2 or more - 1 2 or more  Comment - stumbles  - - -  Injury with Fall? No No - No -  Risk Factor Category  - - - - (No Data)  Comment - - - -  tripped only x 2, rushing too fast, no balance or weakness issues  Follow up Falls prevention discussed;Education provided - - - -    Cognitive Function: MMSE - Mini Mental State Exam 09/07/2016  Orientation to time 5  Orientation to Place 5  Registration 3  Attention/ Calculation 5  Recall 2  Language- name 2 objects 2  Language- repeat 1  Language- follow 3 step command 3  Language- read & follow direction 1  Write a sentence 1  Copy design 1  Total score 29        Screening Tests Health Maintenance  Topic Date Due  . OPHTHALMOLOGY EXAM  10/04/1950  . FOOT EXAM  11/20/2015  . HEMOGLOBIN A1C  09/03/2016  . INFLUENZA VACCINE  09/06/2016  . TETANUS/TDAP  09/25/2018  . DEXA SCAN  Completed  . PNA vac Low Risk Adult  Completed      Plan:  Continue to eat heart healthy diet (full of fruits, vegetables, whole grains, lean protein, water--limit salt, fat, and sugar intake) and increase physical activity as tolerated.  Continue doing brain stimulating activities (puzzles, reading, adult coloring books, staying active) to keep memory sharp.   I have personally reviewed and noted the following in the patient's chart:   . Medical and social history . Use of alcohol, tobacco or illicit drugs  . Current medications and supplements . Functional ability and status . Nutritional status . Physical activity . Advanced directives . List of other physicians . Vitals . Screenings to include cognitive, depression, and falls . Referrals and appointments  In addition, I have reviewed and discussed with patient certain preventive protocols, quality metrics, and best practice recommendations. A written personalized care plan for preventive services as well as general preventive health recommendations were provided to patient.     Michiel Cowboy, RN   09/07/2016   Medical screening examination/treatment/procedure(s) were performed by non-physician practitioner and as supervising physician  I was immediately available for consultation/collaboration. I agree with above. Cathlean Cower, MD

## 2016-09-07 NOTE — Progress Notes (Signed)
Pre visit review using our clinic review tool, if applicable. No additional management support is needed unless otherwise documented below in the visit note. 

## 2016-09-09 ENCOUNTER — Other Ambulatory Visit: Payer: Self-pay | Admitting: Gastroenterology

## 2016-09-09 ENCOUNTER — Other Ambulatory Visit: Payer: Self-pay | Admitting: Internal Medicine

## 2016-09-10 NOTE — Assessment & Plan Note (Signed)
stable overall by history and exam, recent data reviewed with pt, and pt to continue medical treatment as before,  to f/u any worsening symptoms or concerns Lab Results  Component Value Date   HGBA1C 6.2 09/07/2016

## 2016-09-10 NOTE — Assessment & Plan Note (Signed)
stable overall by history and exam, recent data reviewed with pt, and pt to continue medical treatment as before,  to f/u any worsening symptoms or concerns Lab Results  Component Value Date   LDLCALC 38 09/07/2016

## 2016-09-10 NOTE — Assessment & Plan Note (Signed)
Unocontrolled, mild to mod, for increased avapro 300 qd, to f/u any worsening symptoms or concerns

## 2016-09-27 ENCOUNTER — Telehealth: Payer: Self-pay | Admitting: Internal Medicine

## 2016-09-27 MED ORDER — PANTOPRAZOLE SODIUM 40 MG PO TBEC: 40.0000 mg | DELAYED_RELEASE_TABLET | Freq: Every day | ORAL | 1 refills | Status: DC

## 2016-09-27 MED ORDER — IRBESARTAN 150 MG PO TABS: 150.0000 mg | ORAL_TABLET | Freq: Every day | ORAL | 1 refills | Status: DC

## 2016-09-27 MED ORDER — METFORMIN HCL 1000 MG PO TABS: ORAL_TABLET | ORAL | 1 refills | Status: DC

## 2016-09-27 NOTE — Telephone Encounter (Signed)
Reviewed chart pt is up-to-date sent refills to [facility].../lmb  

## 2016-09-27 NOTE — Telephone Encounter (Signed)
Pt called for a refill of irbesartan (AVAPRO) 150 MG tablet  And  pantoprazole (PROTONIX) 40 MG tablet And  metFORMIN (GLUCOPHAGE) 1000 MG tablet  Had 67mo fu on 09/07/2016 Please advise [facility]

## 2016-10-05 ENCOUNTER — Other Ambulatory Visit: Payer: Self-pay | Admitting: Internal Medicine

## 2016-10-06 NOTE — Telephone Encounter (Signed)
Strips done erx  Klonopin - Done hardcopy to [facility]

## 2016-10-06 NOTE — Telephone Encounter (Signed)
Faxed

## 2016-11-07 ENCOUNTER — Other Ambulatory Visit: Payer: Self-pay | Admitting: Internal Medicine

## 2016-12-04 ENCOUNTER — Other Ambulatory Visit: Payer: Self-pay | Admitting: Internal Medicine

## 2016-12-07 ENCOUNTER — Ambulatory Visit (INDEPENDENT_AMBULATORY_CARE_PROVIDER_SITE_OTHER): Payer: Medicare Other | Admitting: General Practice

## 2016-12-07 DIAGNOSIS — Z23 Encounter for immunization: Secondary | ICD-10-CM | POA: Diagnosis not present

## 2016-12-12 ENCOUNTER — Other Ambulatory Visit: Payer: Self-pay | Admitting: Internal Medicine

## 2016-12-12 NOTE — Telephone Encounter (Signed)
Faxed

## 2016-12-12 NOTE — Telephone Encounter (Signed)
Done hardcopy to Shirron  

## 2017-01-10 ENCOUNTER — Other Ambulatory Visit: Payer: Self-pay | Admitting: Internal Medicine

## 2017-01-10 NOTE — Telephone Encounter (Signed)
[place] controlled substance database checked.  Ok to fill medication. rx sent 

## 2017-02-03 ENCOUNTER — Other Ambulatory Visit: Payer: Self-pay | Admitting: Internal Medicine

## 2017-03-09 ENCOUNTER — Other Ambulatory Visit: Payer: Self-pay | Admitting: Internal Medicine

## 2017-03-09 NOTE — Telephone Encounter (Signed)
Done erx 

## 2017-03-14 ENCOUNTER — Other Ambulatory Visit (INDEPENDENT_AMBULATORY_CARE_PROVIDER_SITE_OTHER): Payer: Medicare Other

## 2017-03-14 ENCOUNTER — Ambulatory Visit (INDEPENDENT_AMBULATORY_CARE_PROVIDER_SITE_OTHER): Payer: Medicare Other | Admitting: Internal Medicine

## 2017-03-14 ENCOUNTER — Encounter: Payer: Self-pay | Admitting: Internal Medicine

## 2017-03-14 VITALS — BP 158/96 | HR 73 | Temp 97.9°F | Ht 61.0 in | Wt 212.0 lb

## 2017-03-14 DIAGNOSIS — I10 Essential (primary) hypertension: Secondary | ICD-10-CM

## 2017-03-14 DIAGNOSIS — I350 Nonrheumatic aortic (valve) stenosis: Secondary | ICD-10-CM | POA: Insufficient documentation

## 2017-03-14 DIAGNOSIS — F32A Depression, unspecified: Secondary | ICD-10-CM

## 2017-03-14 DIAGNOSIS — E119 Type 2 diabetes mellitus without complications: Secondary | ICD-10-CM

## 2017-03-14 DIAGNOSIS — F329 Major depressive disorder, single episode, unspecified: Secondary | ICD-10-CM

## 2017-03-14 DIAGNOSIS — Z0001 Encounter for general adult medical examination with abnormal findings: Secondary | ICD-10-CM | POA: Diagnosis not present

## 2017-03-14 DIAGNOSIS — M810 Age-related osteoporosis without current pathological fracture: Secondary | ICD-10-CM

## 2017-03-14 LAB — TSH: TSH: 4.49 u[IU]/mL (ref 0.35–4.50)

## 2017-03-14 LAB — HEPATIC FUNCTION PANEL
ALBUMIN: 3.8 g/dL (ref 3.5–5.2)
ALK PHOS: 44 U/L (ref 39–117)
ALT: 23 U/L (ref 0–35)
AST: 21 U/L (ref 0–37)
BILIRUBIN DIRECT: 0.2 mg/dL (ref 0.0–0.3)
Total Bilirubin: 1.2 mg/dL (ref 0.2–1.2)
Total Protein: 6.9 g/dL (ref 6.0–8.3)

## 2017-03-14 LAB — LIPID PANEL
CHOL/HDL RATIO: 2
Cholesterol: 82 mg/dL (ref 0–200)
HDL: 33.3 mg/dL — ABNORMAL LOW (ref >39.00)
LDL Cholesterol: 37 mg/dL (ref 0–99)
NONHDL: 48.79
Triglycerides: 59 mg/dL (ref 0.0–149.0)
VLDL: 11.8 mg/dL (ref 0.0–40.0)

## 2017-03-14 LAB — CBC WITH DIFFERENTIAL/PLATELET
BASOS ABS: 0 10*3/uL (ref 0.0–0.1)
Basophils Relative: 0.3 % (ref 0.0–3.0)
Eosinophils Absolute: 0.1 10*3/uL (ref 0.0–0.7)
Eosinophils Relative: 1.6 % (ref 0.0–5.0)
HCT: 37.9 % (ref 36.0–46.0)
Hemoglobin: 12.5 g/dL (ref 12.0–15.0)
LYMPHS ABS: 2 10*3/uL (ref 0.7–4.0)
LYMPHS PCT: 30.6 % (ref 12.0–46.0)
MCHC: 33.1 g/dL (ref 30.0–36.0)
MCV: 95.5 fl (ref 78.0–100.0)
MONOS PCT: 8.1 % (ref 3.0–12.0)
Monocytes Absolute: 0.5 10*3/uL (ref 0.1–1.0)
NEUTROS PCT: 59.4 % (ref 43.0–77.0)
Neutro Abs: 3.8 10*3/uL (ref 1.4–7.7)
Platelets: 202 10*3/uL (ref 150.0–400.0)
RBC: 3.97 Mil/uL (ref 3.87–5.11)
RDW: 13.3 % (ref 11.5–15.5)
WBC: 6.4 10*3/uL (ref 4.0–10.5)

## 2017-03-14 LAB — URINALYSIS, ROUTINE W REFLEX MICROSCOPIC
Bilirubin Urine: NEGATIVE
HGB URINE DIPSTICK: NEGATIVE
Ketones, ur: NEGATIVE
Leukocytes, UA: NEGATIVE
NITRITE: NEGATIVE
RBC / HPF: NONE SEEN (ref 0–?)
Specific Gravity, Urine: <=1.005 — AB (ref 1.000–1.030)
Total Protein, Urine: NEGATIVE
URINE GLUCOSE: NEGATIVE
Urobilinogen, UA: 0.2 (ref 0.0–1.0)
pH: 6 (ref 5.0–8.0)

## 2017-03-14 LAB — BASIC METABOLIC PANEL
BUN: 17 mg/dL (ref 6–23)
CALCIUM: 9.9 mg/dL (ref 8.4–10.5)
CO2: 30 meq/L (ref 19–32)
CREATININE: 0.7 mg/dL (ref 0.40–1.20)
Chloride: 105 mEq/L (ref 96–112)
GFR: 85.92 mL/min (ref >60.00)
Glucose, Bld: 113 mg/dL — ABNORMAL HIGH (ref 70–99)
Potassium: 4.4 mEq/L (ref 3.5–5.1)
Sodium: 139 mEq/L (ref 135–145)

## 2017-03-14 LAB — MICROALBUMIN / CREATININE URINE RATIO
CREATININE, U: 29.9 mg/dL
MICROALB UR: 1.7 mg/dL (ref 0.0–1.9)
Microalb Creat Ratio: 5.7 mg/g (ref 0.0–30.0)

## 2017-03-14 LAB — HEMOGLOBIN A1C: HEMOGLOBIN A1C: 6.5 % (ref 4.6–6.5)

## 2017-03-14 MED ORDER — LOSARTAN POTASSIUM-HCTZ 100-12.5 MG PO TABS: 1.0000 | ORAL_TABLET | Freq: Every day | ORAL | 3 refills | Status: DC

## 2017-03-14 NOTE — Progress Notes (Signed)
Subjective:    Patient ID: Mary Mccarthy, female    DOB: [birth year], 77 y.o.   MRN: [id number]  HPI  Here for wellness and f/u;  Overall doing ok;  Pt denies neurological change such as new headache, facial or extremity weakness. Pt states overall good compliance with treatment and medications, good tolerability, and has been trying to follow appropriate diet.  Pt denies worsening depressive symptoms, suicidal ideation or panic. No fever, night sweats, wt loss, loss of appetite, or other constitutional symptoms.  Pt states good ability with ADL's, has low fall risk, home safety reviewed and adequate, no other significant changes in hearing or vision, and only occasionally active with exercise.  BP has been increased recently BP Readings from Last 3 Encounters:  03/14/17 (!) 158/96  09/07/16 (!) 164/87  03/09/16 140/80   Wt Readings from Last 3 Encounters:  03/14/17 212 lb (96.2 kg)  09/07/16 205 lb (93 kg)  03/09/16 202 lb (91.6 kg)  Also c/o mild worsening depressive symtpoms, Has several social stressors recently with suicide of nephew at 82yo dec 2018 at about the same time her sister has been critically ill several times.  Pt first husband died with suicide in his 61's.  Car broke down in dec as well, has not had labs prior to this visit as she could not get here.  No Si or HI, tends to minimize her illness and declines ssri such as celexa.  Having stress with husband as well. Tolerating fosamax .  Pt denies polydipsia, polyuria, or low sugar symptoms such as weakness or confusion improved with po intake.  Pt states overall good compliance with meds, trying to follow lower cholesterol, diabetic diet, wt overall stable but little exercise however.    In fact, mentions she has to sit to rest after only about 100 ft when walking faster due to generalized weakness  Pt denies chest pain, increased sob or doe, wheezing, orthopnea, PND, increased LE swelling, palpitations, dizziness or syncope. Past  Medical History:  Diagnosis Date  . ALLERGIC RHINITIS 06/24/2007  . Anemia   . AVM (arteriovenous malformation) of colon   . Blood transfusion without reported diagnosis 05/2015  . COLONIC POLYPS, HX OF 06/24/2007  . DEPRESSION 10/08/2006  . DIABETES MELLITUS, TYPE II 10/05/2006  . GERD 06/24/2007  . Heart murmur   . HYPERLIPIDEMIA 10/08/2006  . HYPERTENSION 10/08/2006  . INSOMNIA 09/24/2008  . Left knee DJD   . Myocardial infarction ([place]) 05/2015   pt states mild  . Osteoporosis 03/10/2016  . PEPTIC ULCER DISEASE 10/08/2006   Past Surgical History:  Procedure Laterality Date  . APPENDECTOMY    . BREAST BIOPSY    . CARDIAC CATHETERIZATION N/A 05/21/2015   Procedure: Left Heart Cath and Coronary Angiography;  Surgeon: Leonie Man, MD;  Location: [facility] [facility];  Service: Cardiovascular;  Laterality: N/A;  . COLONOSCOPY N/A 05/20/2015   Procedure: COLONOSCOPY;  Surgeon: Manus Gunning, MD;  Location: [facility];  Service: Gastroenterology;  Laterality: N/A;  . ESOPHAGOGASTRODUODENOSCOPY N/A 05/20/2015   Procedure: ESOPHAGOGASTRODUODENOSCOPY (EGD);  Surgeon: Manus Gunning, MD;  Location: [place];  Service: Gastroenterology;  Laterality: N/A;  . TONSILLECTOMY    . TUBAL LIGATION      reports that  has never smoked. she has never used smokeless tobacco. She reports that she does not drink alcohol or use drugs. family history includes Alcohol abuse in her other; Arthritis in her other; Cancer in her mother; Depression in her other;  Diabetes in her other; Heart disease in her other; Hyperlipidemia in her other; Hypertension in her other; Stroke in her other. Allergies  Allergen Reactions  . Ibuprofen Other (See Comments)    Bleeding events   Current Outpatient Medications on File Prior to Visit  Medication Sig Dispense Refill  . acetaminophen (TYLENOL) 500 MG tablet Take 500 mg by mouth every 6 (six) hours as needed for moderate pain.    Marland Kitchen alendronate (FOSAMAX) 70 MG  tablet Take 1 tablet (70 mg total) by mouth every 7 (seven) days. Take with a full glass of water on an empty stomach. 4 tablet 11  . atorvastatin (LIPITOR) 80 MG tablet Take 80 mg by mouth daily.    . Blood Glucose Monitoring Suppl (ONE TOUCH ULTRA 2) w/Device KIT Use as directed daily 1 each 0  . clonazePAM (KLONOPIN) 0.5 MG tablet take 1 TABLET BY MOUTH TWICE DAILY AS NEEDED for anxiety 60 tablet 2  . glimepiride (AMARYL) 2 MG tablet Take 1 tablet (2 mg total) by mouth daily before breakfast. 90 tablet 3  . metFORMIN (GLUCOPHAGE) 1000 MG tablet 1 tab by mouth in the AM,and 1/2 tab in the PM. 90 tablet 1  . ONE TOUCH ULTRA TEST test strip Use as instructed Test 1-2 times a day 200 each 11  . ONETOUCH DELICA LANCETS 00Q MISC use as directed test 1-2 times a day  0  . pantoprazole (PROTONIX) 40 MG tablet Take 1 tablet (40 mg total) by mouth daily. 90 tablet 0  . vitamin C (VITAMIN C) 500 MG tablet Take 1 tablet (500 mg total) by mouth 2 (two) times daily. 60 tablet 0   No current facility-administered medications on file prior to visit.    Review of Systems Constitutional: Negative for other unusual diaphoresis, sweats, appetite or weight changes HENT: Negative for other worsening hearing loss, ear pain, facial swelling, mouth sores or neck stiffness.   Eyes: Negative for other worsening pain, redness or other visual disturbance.  Respiratory: Negative for other stridor or swelling Cardiovascular: Negative for other palpitations or other chest pain  Gastrointestinal: Negative for worsening diarrhea or loose stools, blood in stool, distention or other pain Genitourinary: Negative for hematuria, flank pain or other change in urine volume.  Musculoskeletal: Negative for myalgias or other joint swelling.  Skin: Negative for other color change, or other wound or worsening drainage.  Neurological: Negative for other syncope or numbness. Hematological: Negative for other adenopathy or  swelling Psychiatric/Behavioral: Negative for hallucinations, other worsening agitation, SI, self-injury, or new decreased concentration All other exam system neg per pt    Objective:   Physical Exam BP (!) 158/96   Pulse 73   Temp 97.9 F (36.6 C) (Oral)   Ht '5\' 1"'$  (1.549 m)   Wt 212 lb (96.2 kg)   SpO2 97%   BMI 40.06 kg/m  VS noted,  Constitutional: Pt is oriented to person, place, and time. Appears well-developed and well-nourished, in no significant distress and comfortable Head: Normocephalic and atraumatic  Eyes: Conjunctivae and EOM are normal. Pupils are equal, round, and reactive to light Right Ear: External ear normal without discharge Left Ear: External ear normal without discharge Nose: Nose without discharge or deformity Mouth/Throat: Oropharynx is without other ulcerations and moist  Neck: Normal range of motion. Neck supple. No JVD present. No tracheal deviation present or significant neck LA or mass Cardiovascular: Normal rate, regular rhythm, normal heart sounds and intact distal pulses.  except for gr 2/6 syst  murmur RUSB Pulmonary/Chest: WOB normal and breath sounds without rales or wheezing  Abdominal: Soft. Bowel sounds are normal. NT. No HSM  Musculoskeletal: Normal range of motion. Exhibits trace bilat pedal edema Lymphadenopathy: Has no other cervical adenopathy.  Neurological: Pt is alert and oriented to person, place, and time. Pt has normal reflexes. No cranial nerve deficit. Motor grossly intact, Gait intact Skin: Skin is warm and dry. No rash noted or new ulcerations Psychiatric:  Has depressed mood and affect. Behavior is normal without agitation No other exam findings   Lab Results  Component Value Date   WBC 4.8 03/06/2016   HGB 11.7 (L) 03/06/2016   HCT 35.2 (L) 03/06/2016   PLT 231.0 03/06/2016   GLUCOSE 116 (H) 09/07/2016   CHOL 88 09/07/2016   TRIG 90.0 09/07/2016   HDL 31.70 (L) 09/07/2016   LDLCALC 38 09/07/2016   ALT 25 09/07/2016    AST 24 09/07/2016   NA 140 09/07/2016   K 4.5 09/07/2016   CL 104 09/07/2016   CREATININE 0.71 09/07/2016   BUN 12 09/07/2016   CO2 33 (H) 09/07/2016   TSH 3.48 03/06/2016   INR 1.18 05/21/2015   HGBA1C 6.2 09/07/2016   MICROALBUR 2.3 (H) 03/06/2016       Assessment & Plan:

## 2017-03-14 NOTE — Patient Instructions (Addendum)
Ok to change the avapro to Losartan HCT for BP and feet swelling  Please remember to make your yearly eye doctor appointment  Please continue all other medications as before, and refills have been done if requested.  Please have the pharmacy call with any other refills you may need.  Please continue your efforts at being more active, low cholesterol diet, and weight control.  You are otherwise up to date with prevention measures today.  Please keep your appointments with your specialists as you may have planned  You will be contacted regarding the referral for: Cardiology  Please go to the LAB in the Basement (turn left off the elevator) for the tests to be done today  You will be contacted by phone if any changes need to be made immediately.  Otherwise, you will receive a letter about your results with an explanation, but please check with MyChart first.  Please remember to sign up for MyChart if you have not done so, as this will be important to you in the future with finding out test results, communicating by private email, and scheduling acute appointments online when needed.  Please return in 6 months, or sooner if needed, with Lab testing done 3-5 days before

## 2017-03-18 ENCOUNTER — Encounter: Payer: Self-pay | Admitting: Internal Medicine

## 2017-03-18 NOTE — Assessment & Plan Note (Signed)

## 2017-03-18 NOTE — Assessment & Plan Note (Signed)
stable overall by history and exam, recent data reviewed with pt, and pt to continue medical treatment as before,  to f/u any worsening symptoms or concerns Lab Results  Component Value Date   HGBA1C 6.5 03/14/2017

## 2017-03-18 NOTE — Assessment & Plan Note (Signed)
Mild to mod, but declines celexa or counseling referral

## 2017-03-18 NOTE — Assessment & Plan Note (Signed)
D/w pt, tolerating fosamax, to cont same tx

## 2017-03-18 NOTE — Assessment & Plan Note (Addendum)
?   More symptomatic recently with mild worsening exertional intolerance, for referral cards  In addition to the time spent performing CPE, I spent an additional 25 minutes face to face,in which greater than 50% of this time was spent in counseling and coordination of care for patient's illness as documented, including the differential dx, treatment, further evaluation and other management of AS, depression, osteoporosis, uncontrolled HTN, and DM

## 2017-03-18 NOTE — Assessment & Plan Note (Signed)
Uncontrolled, to change avapro which has been recalled to losartan HCT for BP and trace edema

## 2017-03-30 ENCOUNTER — Ambulatory Visit: Payer: Medicare Other | Admitting: Cardiology

## 2017-03-30 ENCOUNTER — Encounter: Payer: Self-pay | Admitting: Cardiology

## 2017-03-30 VITALS — BP 158/82 | HR 75 | Ht 61.0 in | Wt 210.0 lb

## 2017-03-30 DIAGNOSIS — I2510 Atherosclerotic heart disease of native coronary artery without angina pectoris: Secondary | ICD-10-CM

## 2017-03-30 DIAGNOSIS — I10 Essential (primary) hypertension: Secondary | ICD-10-CM

## 2017-03-30 DIAGNOSIS — I350 Nonrheumatic aortic (valve) stenosis: Secondary | ICD-10-CM | POA: Diagnosis not present

## 2017-03-30 DIAGNOSIS — E785 Hyperlipidemia, unspecified: Secondary | ICD-10-CM

## 2017-03-30 DIAGNOSIS — I248 Other forms of acute ischemic heart disease: Secondary | ICD-10-CM

## 2017-03-30 DIAGNOSIS — I272 Pulmonary hypertension, unspecified: Secondary | ICD-10-CM | POA: Diagnosis not present

## 2017-03-30 DIAGNOSIS — I5032 Chronic diastolic (congestive) heart failure: Secondary | ICD-10-CM

## 2017-03-30 NOTE — Progress Notes (Signed)
PCP: Mary Borg, MD  Clinic Note: Chief Complaint  Patient presents with  . New Patient (Initial Visit)    Exercise intolerance, aortic stenosis    HPI: Mary Mccarthy is a 77 y.o. female who is being seen today for the evaluation of "exertional intolerance" but also moderate aortic stenosis by echo at the request of Mary Borg, MD.  Ms. Mary Mccarthy was seen by the cardiology team (Drs. Mary Mccarthy and [facility]) back in April 2017 where she was admitted with rapid heartbeats found to be in SVT with diffuse ST changes.  She was also severely anemic requiring 4 unit blood transfusion.  In this setting she had troponin elevation of 6.5.  Following GI procedure to cauterize lesions, she was referred for cardiac catheterization (performed by me) which revealed only moderate disease with the most significant lesion being a 60% ramus intermedius lesion.  Plan was to optimize medical management. She was also noted to have aortic stenosis on echocardiogram that was moderate.  Her family history is notable for the fact that her sister Mary Mccarthy) is a well-known patient to our practice who sees Dr. Sallyanne Mccarthy.  She has significant CAD and arrhythmia issues.  Mary Mccarthy was last seen on in the hospital by Dr. Haroldine Mccarthy after her catheterization in the kidney that was she was relatively stable from a cardiac standpoint with low-grade CAD and normal EF.  Can follow-up with cardiology on an as-needed basis.  Recent Hospitalizations: Really none since the April 2017 episode for GI bleed  Studies Personally Reviewed - (if available, images/films reviewed: From Epic Chart or Care Everywhere)  2D Echo October 2015: Mild concentric LVH.  Vigorous EF of 65-70%.  Mild aortic stenosis (mean gradient 16 mmHg).  Mildly elevated PA pressures.  Cardiac Cath 05/2015: (Positive troponin in the setting of GI bleed).  Ostial ramus 60%.  Ostial RCA 30%.  Distal LAD tapers to small vessel with diffuse 40-50%  stenosis.  Normal LV function.  Elevated LVEDP.  2D Echo April 2017: Moderate LVH.  EF 55-60%.  Moderate aortic stenosis (mean gradient 22 mmHg, peak 42 mmHg -AVA 1.1-1.2 cm.).  Severe LA dilation.  Moderate TR with RVSP peak 51 mmHg and dilated IVC  Interval History: [name] presents here today not really sure why she is here actually.  She has noted that she probably does not do her ADLs as vigorously as she used to, she gets a little short of breath if she pushes himself walking, but denies any chest tightness or pressure/dyspnea with rest or exertion.  No rapid irregular heartbeats or palpitations, lightheadedness, dizziness or syncope/near syncope.  No TIA or amaurosis fugax symptoms. Per PCP, she indicated that she noted exertional dyspnea having to walk fast more than 100 feet).  Not with routine activity however. --This was actually not mentioned to me.  She has not had any chest pain symptoms since 2017 when she was anemic.  She has not had any melena, hematochezia or hematemesis.  No epistaxis or hematuria. No claudication.  ROS: A comprehensive was performed. Review of Systems  Constitutional: Negative for chills, diaphoresis, fever and malaise/fatigue (Just occasional exercise intolerance).  HENT: Negative for congestion, hearing loss, nosebleeds and sore throat.   Eyes: Negative for blurred vision.  Respiratory: Negative for cough, shortness of breath and wheezing.   Gastrointestinal: Negative for abdominal pain, blood in stool, constipation, heartburn and melena.  Genitourinary: Negative for dysuria and hematuria.  Musculoskeletal: Positive for joint pain. Negative for myalgias.  Skin:  Negative.   Neurological: Negative for dizziness and focal weakness.  Psychiatric/Behavioral: Negative for depression, hallucinations and suicidal ideas. The patient is nervous/anxious (Lots of social stress).   All other systems reviewed and are negative.  I have reviewed and (if needed)  personally updated the patient's problem list, medications, allergies, past medical and surgical history, social and family history.   Past Medical History:  Diagnosis Date  . ALLERGIC RHINITIS 06/24/2007  . Anemia   . AVM (arteriovenous malformation) of colon   . Blood transfusion without reported diagnosis 05/2015  . COLONIC POLYPS, HX OF 06/24/2007  . DEPRESSION 10/08/2006  . DIABETES MELLITUS, TYPE II 10/05/2006  . GERD 06/24/2007  . Heart murmur    Moderate Aortic Stenosis by Echo 05/2015  . HYPERLIPIDEMIA 10/08/2006  . HYPERTENSION 10/08/2006  . INSOMNIA 09/24/2008  . Left knee DJD   . Myocardial infarction (Mary Mccarthy) 05/2015   pt states mild  . Osteoporosis 03/10/2016  . PEPTIC ULCER DISEASE 10/08/2006    Past Surgical History:  Procedure Laterality Date  . APPENDECTOMY    . BREAST BIOPSY    . CARDIAC CATHETERIZATION N/A 05/21/2015   Procedure: Left Heart Cath and Coronary Angiography;  Surgeon: Mary Man, MD;  Location: [facility];  Service: Cardiovascular;: Ost RI 60%, Ost RCA 30%, dLAD tapers to small vessel w/ 40-50%. Mildly elevated LVEDP. Normal LV Fxn.  . COLONOSCOPY N/A 05/20/2015   Procedure: COLONOSCOPY;  Surgeon: Mary Gunning, MD;  Location: [place];  Service: Gastroenterology;  Laterality: N/A;  . ESOPHAGOGASTRODUODENOSCOPY N/A 05/20/2015   Procedure: ESOPHAGOGASTRODUODENOSCOPY (EGD);  Surgeon: Mary Gunning, MD;  Location: [address];  Service: Gastroenterology;  Laterality: N/A;  . TONSILLECTOMY    . TRANSTHORACIC ECHOCARDIOGRAM  10/'15; 4/'17   a) mild AS, mildly elevated PA pressures.;; b)  Moderate LVH.  EF 55-60%.  Moderate aortic stenosis (mean gradient 22 mmHg, peak 42 mmHg -AVA 1.1-1.2 cm.).  Severe LA dilation.  Moderate TR with RVSP peak 51 mmHg and dilated IVC  . TUBAL LIGATION      Current Meds  Medication Sig  . acetaminophen (TYLENOL) 500 MG tablet Take 500 mg by mouth every 6 (six) hours as needed for moderate pain.  Marland Kitchen  alendronate (FOSAMAX) 70 MG tablet Take 1 tablet (70 mg total) by mouth every 7 (seven) days. Take with a full glass of water on an empty stomach.  Marland Kitchen atorvastatin (LIPITOR) 80 MG tablet Take 80 mg by mouth daily.  . Blood Glucose Monitoring Suppl (ONE TOUCH ULTRA 2) w/Device KIT Use as directed daily  . clonazePAM (KLONOPIN) 0.5 MG tablet take 1 TABLET BY MOUTH TWICE DAILY AS NEEDED for anxiety  . glimepiride (AMARYL) 2 MG tablet Take 1 tablet (2 mg total) by mouth daily before breakfast.  . losartan-hydrochlorothiazide (HYZAAR) 100-12.5 MG tablet Take 1 tablet by mouth daily.  . metFORMIN (GLUCOPHAGE) 1000 MG tablet 1 tab by mouth in the AM,and 1/2 tab in the PM.  . ONE TOUCH ULTRA TEST test strip Use as instructed Test 1-2 times a day  . ONETOUCH DELICA LANCETS 89V MISC use as directed test 1-2 times a day  . pantoprazole (PROTONIX) 40 MG tablet Take 1 tablet (40 mg total) by mouth daily.  . vitamin C (VITAMIN C) 500 MG tablet Take 1 tablet (500 mg total) by mouth 2 (two) times daily.    Allergies  Allergen Reactions  . Ibuprofen Other (See Comments)    Bleeding events    Social History  Tobacco Use  . Smoking status: Never Smoker  . Smokeless tobacco: Never Used  Substance Use Topics  . Alcohol use: No    Alcohol/week: 0.0 oz  . Drug use: No   Social History   Social History Narrative  . Not on file   Significant social stressor: 65 year old nephew recently committed suicide (even more concerning because her first husband also committed suicide).  Also her sister Mary Mccarthy has become acutely ill. But okay  family history includes Alcohol abuse in her other; Arrhythmia in her sister; Arthritis in her other; CAD in her sister; Cancer in her mother; Depression in her other; Diabetes in her other; Heart attack (age of onset: 27) in her sister; Heart disease in her other; Hyperlipidemia in her other; Hypertension in her other; Stroke in her other.  Wt Readings from Last 3  Encounters:  03/30/17 210 lb (95.3 kg)  03/14/17 212 lb (96.2 kg)  09/07/16 205 lb (93 kg)    PHYSICAL EXAM BP (!) 158/82   Pulse 75   Ht _0  (1.549 m)   Wt 210 lb (95.3 kg)   BMI 39.68 kg/m  Physical Exam  Constitutional: She is oriented to person, place, and time. She appears well-developed and well-nourished. No distress.  Appears relatively healthy for her stated age.  Well-groomed.  HENT:  Head: Normocephalic and atraumatic.  Mouth/Throat: No oropharyngeal exudate.  Eyes: Conjunctivae and EOM are normal. No scleral icterus.  Neck: Normal range of motion. Neck supple. No hepatojugular reflux and no JVD present. Carotid bruit is not present (Radiated aortic murmur). No thyromegaly present.  Cardiovascular: Normal rate and intact distal pulses.  No extrasystoles are present. PMI is not displaced. Exam reveals no gallop and no friction rub.  Murmur heard. High-pitched harsh crescendo-decrescendo midsystolic murmur is present with a grade of 2/6 at the upper right sternal border radiating to the neck. Pulmonary/Chest: Effort normal and breath sounds normal. No respiratory distress. She has no wheezes. She has no rales.  Abdominal: Soft. Bowel sounds are normal. She exhibits no distension. There is no tenderness. There is no rebound.  Musculoskeletal: Normal range of motion. She exhibits no edema or deformity.  Neurological: She is alert and oriented to person, place, and time. No cranial nerve deficit.  Skin: Skin is warm and dry.  Psychiatric: She has a normal mood and affect. Her behavior is normal. Judgment and thought content normal.  Nursing note and vitals reviewed.    Adult ECG Report  Rata: 75 ;  Rhythm: normal sinus rhythm and Normal axis, intervals and durations;   Narrative Interpretation: Relatively normal -ST depressions no longer notable.   Other studies Reviewed: Additional studies/ records that were reviewed today include:  Recent Labs:    Lab Results    Component Value Date   CHOL 82 03/14/2017   HDL 33.30 (L) 03/14/2017   LDLCALC 37 03/14/2017   TRIG 59.0 03/14/2017   CHOLHDL 2 03/14/2017   Lab Results  Component Value Date   HGBA1C 6.5 03/14/2017    ASSESSMENT / PLAN: Problem List Items Addressed This Visit    Chronic diastolic heart failure (HCC) (Chronic)    Based on her echo back in 2017, there was no real suggestion of significant diastolic dysfunction, however the left atrium was severely dilated likely related to aortic stenosis and hypertension.  That also would suggest diastolic dysfunction. She is now on HCTZ, had been on Lasix in the past.  Continue to monitor as her edema seems to be  relatively controlled.  No PND orthopnea.      Relevant Orders   ECHOCARDIOGRAM COMPLETE   Coronary artery disease, non-occlusive (Chronic)    Moderate disease on cath.  She is on ARB and statin. Not on antiplatelet agent because of history of GI bleed. For additional blood pressure control, I think beta-blocker such as carvedilol or  Bystolic would be the best option.      Relevant Orders   EKG 12-Lead   ECHOCARDIOGRAM COMPLETE   Demand ischemia of myocardium (HCC) (Chronic)    This was in the setting of severe ischemia and no significant lesions noted other than a 60% ramus intermedius.  No PCI. No recurrent anginal symptoms.      Relevant Orders   EKG 12-Lead   ECHOCARDIOGRAM COMPLETE   Essential hypertension (Chronic)    Her blood pressure still not controlled today.  Her Avapro apparently was recalled (S nothing that would remember hearing.  She is now on Hyzaar -> may very well be a benefit from carvedilol or Bystolic      Relevant Orders   ECHOCARDIOGRAM COMPLETE   Hyperlipidemia with target LDL less than 70 (Chronic)    Well-controlled on high-dose atorvastatin.  May very well be on the tolerate reduction in dose to 40 mg.      Moderate aortic stenosis by prior echocardiogram - Primary (Chronic)    She has not had  an evaluation in 2 years, it is possible that her stenosis has progressed.  We discussed concerning symptoms such as exertional dyspnea or other heart failure symptoms, angina and syncope.  It is hard to tell actually if she is having symptoms or not. Plan: Recheck 2D echocardiogram      Pulmonary hypertension (HCC) (Chronic)    We will reassess with follow-up echo.  If there is indeed significant pulmonary pretension noted, additional systolic blood pressure management would be warranted.  May also benefit from diuretic.  The question is this is related to diastolic heart failure versus some underlying pulmonary condition.  Plan: Follow echo.      Relevant Orders   ECHOCARDIOGRAM COMPLETE      Current medicines are reviewed at length with the patient today. (+/- concerns) n/a The following changes have been made: n/a  Patient Instructions  Medication Instructions:  Your physician recommends that you continue on your current medications as directed. Please refer to the Current Medication list given to you today.  Testing/Procedures: Your physician has requested that you have an echocardiogram. Echocardiography is a painless test that uses sound waves to create images of your heart. It provides your doctor with information about the size and shape of your heart and how well your heart's chambers and valves are working. This procedure takes approximately one hour. There are no restrictions for this procedure.  This will be done at our [facility] location:  [place]: After echo with Dr. Ellyn Hack  Any Other Special Instructions Will Be Listed Below (If Applicable).     If you need a refill on your cardiac medications before your next appointment, please call your pharmacy.     Studies Ordered:   Orders Placed This Encounter  Procedures  . EKG 12-Lead  . ECHOCARDIOGRAM COMPLETE      Glenetta Hew, M.D., M.S. Interventional  Cardiologist   Pager # [phone] Phone # [phone] [address] [place], [place] [address]   Thank you for choosing [facility]!!

## 2017-03-30 NOTE — Patient Instructions (Signed)
Medication Instructions:  Your physician recommends that you continue on your current medications as directed. Please refer to the Current Medication list given to you today.  Testing/Procedures: Your physician has requested that you have an echocardiogram. Echocardiography is a painless test that uses sound waves to create images of your heart. It provides your doctor with information about the size and shape of your heart and how well your heart's chambers and valves are working. This procedure takes approximately one hour. There are no restrictions for this procedure.  This will be done at our [address] location:  [address]  Follow-Up: After echo with Dr. Harding  Any Other Special Instructions Will Be Listed Below (If Applicable).     If you need a refill on your cardiac medications before your next appointment, please call your pharmacy.   

## 2017-04-01 ENCOUNTER — Encounter: Payer: Self-pay | Admitting: Cardiology

## 2017-04-01 NOTE — Assessment & Plan Note (Signed)
Her blood pressure still not controlled today.  Her Avapro apparently was recalled (S nothing that would remember hearing.  She is now on Hyzaar -> may very well be a benefit from carvedilol or Bystolic

## 2017-04-01 NOTE — Assessment & Plan Note (Signed)
Based on her echo back in 2017, there was no real suggestion of significant diastolic dysfunction, however the left atrium was severely dilated likely related to aortic stenosis and hypertension.  That also would suggest diastolic dysfunction. She is now on HCTZ, had been on Lasix in the past.  Continue to monitor as her edema seems to be relatively controlled.  No PND orthopnea.

## 2017-04-01 NOTE — Assessment & Plan Note (Signed)
Moderate disease on cath.  She is on ARB and statin. Not on antiplatelet agent because of history of GI bleed. For additional blood pressure control, I think beta-blocker such as carvedilol or  Bystolic would be the best option.

## 2017-04-01 NOTE — Assessment & Plan Note (Signed)
She has not had an evaluation in 2 years, it is possible that her stenosis has progressed.  We discussed concerning symptoms such as exertional dyspnea or other heart failure symptoms, angina and syncope.  It is hard to tell actually if she is having symptoms or not. Plan: Recheck 2D echocardiogram

## 2017-04-01 NOTE — Assessment & Plan Note (Signed)
Well-controlled on high-dose atorvastatin.  May very well be on the tolerate reduction in dose to 40 mg.

## 2017-04-01 NOTE — Assessment & Plan Note (Signed)
This was in the setting of severe ischemia and no significant lesions noted other than a 60% ramus intermedius.  No PCI. No recurrent anginal symptoms.

## 2017-04-01 NOTE — Assessment & Plan Note (Addendum)
We will reassess with follow-up echo.  If there is indeed significant pulmonary pretension noted, additional systolic blood pressure management would be warranted.  May also benefit from diuretic.  The question is this is related to diastolic heart failure versus some underlying pulmonary condition.  Plan: Follow echo.

## 2017-04-12 ENCOUNTER — Other Ambulatory Visit: Payer: Self-pay

## 2017-04-12 ENCOUNTER — Ambulatory Visit (HOSPITAL_COMMUNITY): Payer: Medicare Other | Attending: Cardiology

## 2017-04-12 DIAGNOSIS — I5032 Chronic diastolic (congestive) heart failure: Secondary | ICD-10-CM

## 2017-04-12 DIAGNOSIS — I248 Other forms of acute ischemic heart disease: Secondary | ICD-10-CM | POA: Diagnosis not present

## 2017-04-12 DIAGNOSIS — Z6839 Body mass index (BMI) 39.0-39.9, adult: Secondary | ICD-10-CM | POA: Insufficient documentation

## 2017-04-12 DIAGNOSIS — I270 Primary pulmonary hypertension: Secondary | ICD-10-CM | POA: Insufficient documentation

## 2017-04-12 DIAGNOSIS — E119 Type 2 diabetes mellitus without complications: Secondary | ICD-10-CM | POA: Insufficient documentation

## 2017-04-12 DIAGNOSIS — I2510 Atherosclerotic heart disease of native coronary artery without angina pectoris: Secondary | ICD-10-CM | POA: Diagnosis not present

## 2017-04-12 DIAGNOSIS — I272 Pulmonary hypertension, unspecified: Secondary | ICD-10-CM | POA: Diagnosis not present

## 2017-04-12 DIAGNOSIS — I10 Essential (primary) hypertension: Secondary | ICD-10-CM

## 2017-04-12 DIAGNOSIS — I350 Nonrheumatic aortic (valve) stenosis: Secondary | ICD-10-CM | POA: Insufficient documentation

## 2017-04-12 DIAGNOSIS — E669 Obesity, unspecified: Secondary | ICD-10-CM | POA: Insufficient documentation

## 2017-04-12 DIAGNOSIS — E785 Hyperlipidemia, unspecified: Secondary | ICD-10-CM | POA: Diagnosis not present

## 2017-04-12 DIAGNOSIS — I110 Hypertensive heart disease with heart failure: Secondary | ICD-10-CM | POA: Diagnosis not present

## 2017-04-12 HISTORY — PX: TRANSTHORACIC ECHOCARDIOGRAM: SHX275

## 2017-04-23 ENCOUNTER — Telehealth: Payer: Self-pay | Admitting: *Deleted

## 2017-04-23 DIAGNOSIS — I5032 Chronic diastolic (congestive) heart failure: Secondary | ICD-10-CM

## 2017-04-23 DIAGNOSIS — I2510 Atherosclerotic heart disease of native coronary artery without angina pectoris: Secondary | ICD-10-CM

## 2017-04-23 DIAGNOSIS — I350 Nonrheumatic aortic (valve) stenosis: Secondary | ICD-10-CM

## 2017-04-23 NOTE — Telephone Encounter (Signed)
-----   Message from [name], MD sent at 04/13/2017  1:26 PM EST ----- Echocardiogram result: Normal left ventricle size and function.  Ejection fraction is 60-65%.  Grade 1 diastolic dysfunction. Aortic valve is Severely thickened and calcified with restricted mobility.  This is now consistent with moderate to severe aortic stenosis --> This represents progression of disease.  Otherwise only mild left atrial dilation along with minimal elevation of the pulmonary pressures.  Previous report from 2017: Moderate aortic stenosis (mean gradient 22 mmHg, peak 42 mmHg -AVA 1.1-1.2 cm Currently mean gradient is now 34 mmHg with a peak gradient of 60 mmHg.Marland Kitchen  AVA ~0.76 cm.  At this point, the aortic valve is now  in the moderate to severe range, but still probably not narrowed enough to cause symptoms.  We will continue to monitor and follow-up with an annual follow-up next year. Symptoms to monitor for are symptoms of chest tightness or pressure with exertion or without exertion.  Shortness of breath lying down flat or waking up in the middle night short of breath.  Shortness of breath with exertion.  And passing out spells.  Otherwise follow-up as directed.  Bryan Lemma, MD

## 2017-04-25 ENCOUNTER — Other Ambulatory Visit: Payer: Self-pay | Admitting: Internal Medicine

## 2017-04-28 ENCOUNTER — Other Ambulatory Visit: Payer: Self-pay | Admitting: Internal Medicine

## 2017-05-01 ENCOUNTER — Other Ambulatory Visit: Payer: Self-pay | Admitting: Internal Medicine

## 2017-05-01 NOTE — Telephone Encounter (Signed)
Klonopin too soon to refill

## 2017-05-01 NOTE — Telephone Encounter (Signed)
Last filled 04/05/2017 60# 

## 2017-05-07 ENCOUNTER — Ambulatory Visit: Payer: Medicare Other | Admitting: Cardiology

## 2017-05-07 ENCOUNTER — Encounter: Payer: Self-pay | Admitting: Cardiology

## 2017-05-07 VITALS — BP 156/77 | HR 74 | Ht 61.0 in | Wt 211.0 lb

## 2017-05-07 DIAGNOSIS — I2510 Atherosclerotic heart disease of native coronary artery without angina pectoris: Secondary | ICD-10-CM

## 2017-05-07 DIAGNOSIS — I350 Nonrheumatic aortic (valve) stenosis: Secondary | ICD-10-CM

## 2017-05-07 DIAGNOSIS — E785 Hyperlipidemia, unspecified: Secondary | ICD-10-CM

## 2017-05-07 DIAGNOSIS — I10 Essential (primary) hypertension: Secondary | ICD-10-CM | POA: Diagnosis not present

## 2017-05-07 DIAGNOSIS — I248 Other forms of acute ischemic heart disease: Secondary | ICD-10-CM | POA: Diagnosis not present

## 2017-05-07 DIAGNOSIS — I272 Pulmonary hypertension, unspecified: Secondary | ICD-10-CM | POA: Diagnosis not present

## 2017-05-07 DIAGNOSIS — I5032 Chronic diastolic (congestive) heart failure: Secondary | ICD-10-CM | POA: Diagnosis not present

## 2017-05-07 MED ORDER — ATORVASTATIN CALCIUM 80 MG PO TABS: 40.0000 mg | ORAL_TABLET | Freq: Every day | ORAL | 11 refills | Status: DC

## 2017-05-07 NOTE — Patient Instructions (Signed)
Medication Instructions:  Decrease Atorvastatin 20 mg( 1/2 tablet of 40 mg)    Testing/Procedures: Scheduled as [address] Your physician has requested that you have an echocardiogram. Echocardiography is a painless test that uses sound waves to create images of your heart. It provides your doctor with information about the size and shape of your heart and how well your heart's chambers and valves are working. This procedure takes approximately one hour. There are no restrictions for this procedure.    Follow-Up: Your physician wants you to follow-up in: 6 month after ECHO with Dr. Herbie Baltimore. You will receive a reminder letter in the mail two months in advance. If you don't receive a letter, please call our office to schedule the follow-up appointment.   Any Other Special Instructions Will Be Listed Below (If Applicable).  Keep a Blood Pressure log to take to PCP for review   If you need a refill on your cardiac medications before your next appointment, please call your pharmacy.

## 2017-05-07 NOTE — Progress Notes (Signed)
PCP: Biagio Borg, MD  Clinic Note: Chief Complaint  Patient presents with  . Follow-up    Echo  . Aortic Stenosis  . Tachycardia    h/o SVT in setting of GI Bld - Anemia    HPI: Mary Mccarthy is a 77 y.o. female who is being seen today for follow evaluation of aortic stenosis by echo at the request of Biagio Borg, MD.  [name] has a h/o SVT & Mod AS with mild Non-obstructive CAD by Cath (following Type 2 MI:  April 2017 - admitted with a rapid heartbeat/SVT with diffuse "ischemic" changes ( Seen by Drs. [place] and [place]).    Was found to be severely anemic requiring 4 unit blood transfusion.    In this setting she had Troponin elevation of 6.5.  Following GI procedure to cauterize lesions, she was referred for cardiac catheterization (performed by me) revealing mild to moderate nonobstructive CAD --> plan was to optimize medical management.  She was also noted to have moderate aortic stenosis on echocardiogram.  Her family history is notable for the fact that her sister Mary Mccarthy) is a well-known patient to our practice who sees Dr. Sallyanne Kuster.  She has significant CAD and arrhythmia issues.  Mary Mccarthy was seen by me at the request of Dr. Jenny Reichmann for reevaluation of aortic valve stenosis back on March 30, 2017.  She was doing well at that time without any really major symptoms.  A little bit short of breath doing some more vigorous activities, not with ADLs.  No arrhythmias.  She was evaluated with a--> follow-up echocardiogram reviewed below.  Recent Hospitalizations: --  She had an episode of flu that started 2 days after her last visit.  Studies Personally Reviewed - (if available, images/films reviewed: From Epic Chart or Care Everywhere)  Echocardiogram April 12, 2017: EF 60-65%. No RWMA.  GR 1 DD.  Moderate concentric LVH.  Mild LA dilation. Severe calcified aortic valve with moderate-severe aortic stenosis (peak/mean gradients 60/34 mmHg) and in severe  range by AVA (0.8 cm2).  Mild to moderately increased PA pressures 41 mmHg with normal RV size and function..    Interval History: Ms. Mary Mccarthy presents here today for follow-up after echocardiogram.  She is actually doing fairly well from a cardiac standpoint.  Since I last saw her she had flu a couple weeks ago that started 2 days after her last visit.  She has subsequently recovered from her fluid and been doing fairly well.  Her blood pressure is high today, but she indicates that she has had an argument with her husband that started just before she left the house and was running late to get here.  Usually her pressures are better than this.  She is not had any headache or blurred vision, dizziness, wooziness.  No syncope or near syncope.  No TIA or amaurosis fugax symptoms. She has not had any problems all chest tightness / pressure or dyspnea with rest or exertion.  No PND, orthopnea or edema. No rapid irregular heartbeats palpitations./  She is also a bit frustrated b/c their car is broken down & she is having to rely on others for transportation.  Does not have enough $$ to cover cost of repair or purchase another vehicle.   ROS: A comprehensive was performed. Review of Systems  Constitutional: Negative for chills, diaphoresis, fever and malaise/fatigue (Just occasional exercise intolerance).  HENT: Negative for congestion, hearing loss, nosebleeds and sore throat.   Eyes: Negative for  blurred vision.  Respiratory: Negative for cough, shortness of breath and wheezing.   Gastrointestinal: Negative for abdominal pain, blood in stool, constipation, heartburn and melena.  Genitourinary: Negative for dysuria and hematuria.  Musculoskeletal: Positive for joint pain. Negative for myalgias.  Skin: Negative.   Neurological: Negative for dizziness and focal weakness.  Psychiatric/Behavioral: Negative for depression, hallucinations and suicidal ideas. The patient is nervous/anxious (Lots of social  stress).        Recent argument -- a bit upset this AM.  All other systems reviewed and are negative.  I have reviewed and (if needed) personally updated the patient's problem list, medications, allergies, past medical and surgical history, social and family history.   Past Medical History:  Diagnosis Date  . ALLERGIC RHINITIS 06/24/2007  . Anemia   . AVM (arteriovenous malformation) of colon   . Blood transfusion without reported diagnosis 05/2015  . COLONIC POLYPS, HX OF 06/24/2007  . Coronary artery disease, non-occlusive 05/2015   Positive troponin in setting of acute anemia.  Cardiac cath showed ostial ramus 60%, ostial RCA 30% and distal LAD 40 and 50%.  . DEPRESSION 10/08/2006  . DIABETES MELLITUS, TYPE II 10/05/2006  . GERD 06/24/2007  . HYPERLIPIDEMIA 10/08/2006  . HYPERTENSION 10/08/2006  . INSOMNIA 09/24/2008  . Left knee DJD   . Moderate to severe aortic stenosis 05/2015   a) Moderate aortic stenosis (mean gradient 22 mmHg, peak 42 mmHg -AVA 1.1-1.2 cm.).;;b) 04/2017: Severe calcified aortic valve with moderate-severe aortic stenosis (peak-mean gradient 60-34 mmHg -moderate), calculated AVA 0.8 cm -severe..  . Myocardial infarction ([place]) 05/2015   Type 2 MI - related to GI Bleed / Anemia (non-obstructive CAD by Cath)  . Osteoporosis 03/10/2016  . PEPTIC ULCER DISEASE 10/08/2006    Past Surgical History:  Procedure Laterality Date  . APPENDECTOMY    . BREAST BIOPSY    . CARDIAC CATHETERIZATION N/A 05/21/2015   Procedure: Left Heart Cath and Coronary Angiography;  Surgeon: Leonie Man, MD;  Location: [facility];  Service: Cardiovascular;: Ost RI 60%, Ost RCA 30%, dLAD tapers to small vessel w/ 40-50%. Mildly elevated LVEDP. Normal LV Fxn.  . COLONOSCOPY N/A 05/20/2015   Procedure: COLONOSCOPY;  Surgeon: Manus Gunning, MD;  Location: [facility];  Service: Gastroenterology;  Laterality: N/A;  . ESOPHAGOGASTRODUODENOSCOPY N/A 05/20/2015   Procedure:  ESOPHAGOGASTRODUODENOSCOPY (EGD);  Surgeon: Manus Gunning, MD;  Location: [facility];  Service: Gastroenterology;  Laterality: N/A;  . TONSILLECTOMY    . TRANSTHORACIC ECHOCARDIOGRAM  10/'15; 4/'17   a) mild AS, mildly elevated PA pressures.;; b)  Moderate LVH.  EF 55-60%.  Moderate aortic stenosis (mean gradient 22 mmHg, peak 42 mmHg -AVA 1.1-1.2 cm.).  Severe LA dilation.  Moderate TR with RVSP peak 51 mmHg and dilated IVC  . TRANSTHORACIC ECHOCARDIOGRAM  04/12/2017   EF 60-65%. No RWMA.  GR 1 DD.  Moderate concentric LVH.  Mild LA dilation. Severe calcified aortic valve with moderate-severe aortic stenosis (peak/mean gradients 60/34 mmHg) and in severe range by AVA (0.8 cm2).  Mild to moderately increased PA pressures 41 mmHg with normal RV size and function..    . TUBAL LIGATION      Current Meds  Medication Sig  . acetaminophen (TYLENOL) 500 MG tablet Take 500 mg by mouth every 6 (six) hours as needed for moderate pain.  Marland Kitchen alendronate (FOSAMAX) 70 MG tablet Take 1 tablet (70 mg total) by mouth every 7 (seven) days. Take with a full glass of water  on an empty stomach.  Marland Kitchen atorvastatin (LIPITOR) 80 MG tablet Take 0.5 tablets (40 mg total) by mouth daily at 6 PM.  . Blood Glucose Monitoring Suppl (ONE TOUCH ULTRA 2) w/Device KIT Use as directed daily  . clonazePAM (KLONOPIN) 0.5 MG tablet take 1 TABLET BY MOUTH TWICE DAILY AS NEEDED for anxiety  . glimepiride (AMARYL) 2 MG tablet Take 1 tablet (2 mg total) by mouth daily before breakfast.  . losartan-hydrochlorothiazide (HYZAAR) 100-12.5 MG tablet Take 1 tablet by mouth daily.  . metFORMIN (GLUCOPHAGE) 1000 MG tablet 1 tab by mouth in the AM,and 1/2 tab in the PM.  . ONE TOUCH ULTRA TEST test strip Use as instructed Test 1-2 times a day  . ONETOUCH DELICA LANCETS 93A MISC use as directed test 1-2 times a day  . pantoprazole (PROTONIX) 40 MG tablet Take 1 tablet (40 mg total) by mouth daily.  . vitamin C (VITAMIN C) 500 MG tablet  Take 1 tablet (500 mg total) by mouth 2 (two) times daily.  . [DISCONTINUED] atorvastatin (LIPITOR) 80 MG tablet Take 1 tablet (80 mg total) by mouth daily at 6 PM.    Allergies  Allergen Reactions  . Ibuprofen Other (See Comments)    Bleeding events    Social History   Tobacco Use  . Smoking status: Never Smoker  . Smokeless tobacco: Never Used  Substance Use Topics  . Alcohol use: No    Alcohol/week: 0.0 oz  . Drug use: No   Social History   Social History Narrative  . Not on file   Significant social stressor: 50 year old nephew recently committed suicide (even more concerning because her first husband also committed suicide).  Also her sister Mikle Bosworth has become acutely ill.   family history includes Alcohol abuse in her other; Arrhythmia in her sister; Arthritis in her other; CAD in her sister; Cancer in her mother; Depression in her other; Diabetes in her other; Heart attack (age of onset: 75) in her sister; Heart disease in her other; Hyperlipidemia in her other; Hypertension in her other; Stroke in her other.  Wt Readings from Last 3 Encounters:  05/07/17 211 lb (95.7 kg)  03/30/17 210 lb (95.3 kg)  03/14/17 212 lb (96.2 kg)    PHYSICAL EXAM BP (!) 156/77   Pulse 74   Ht '5\' 1"'$  (1.549 m)   Wt 211 lb (95.7 kg)   SpO2 95%   BMI 39.87 kg/m  Physical Exam  Constitutional: She is oriented to person, place, and time. She appears well-developed and well-nourished. No distress.  Appears relatively healthy for her stated age.  Well-groomed.  HENT:  Head: Normocephalic and atraumatic.  Mouth/Throat: No oropharyngeal exudate.  Neck: Normal range of motion. Neck supple. No hepatojugular reflux and no JVD present. Carotid bruit is not present (Radiated aortic murmur). No thyromegaly present.  Cardiovascular: Normal rate and intact distal pulses.  No extrasystoles are present. PMI is not displaced. Exam reveals no gallop and no friction rub.  Murmur heard. High-pitched  harsh crescendo-decrescendo midsystolic murmur is present with a grade of 2/6 at the upper right sternal border radiating to the neck. Pulmonary/Chest: Effort normal and breath sounds normal. No respiratory distress. She has no wheezes. She has no rales.  Musculoskeletal: Normal range of motion. She exhibits no edema or deformity.  Neurological: She is alert and oriented to person, place, and time.  Psychiatric: She has a normal mood and affect. Her behavior is normal. Judgment and thought content normal.  Nursing note  and vitals reviewed.    Adult ECG Report Not checked  Other studies Reviewed: Additional studies/ records that were reviewed today include:  Recent Labs:    Lab Results  Component Value Date   CHOL 82 03/14/2017   HDL 33.30 (L) 03/14/2017   LDLCALC 37 03/14/2017   TRIG 59.0 03/14/2017   CHOLHDL 2 03/14/2017   Lab Results  Component Value Date   HGBA1C 6.5 03/14/2017    ASSESSMENT / PLAN: Problem List Items Addressed This Visit    Pulmonary hypertension ([place]) (Chronic)    Mild to moderate pressure noted on current echo as well.  Probably related to left-sided disease, but need to monitor. No significant symptoms  of dyspnea, but she is not overly active.  No signs of cor pulmonale.      Relevant Medications   atorvastatin (LIPITOR) 80 MG tablet   Other Relevant Orders   ECHOCARDIOGRAM COMPLETE   Morbid obesity ([place]) (Chronic)    The patient understands the need to lose weight with diet and exercise. We have discussed specific strategies for this.      Moderate to severe aortic stenosis - Primary (Chronic)    Certainly she is has had progression of her aortic stenosis since 2017.  We will follow this up again in 6 months to ensure no further progression of disease.  Thankfully, she is not symptomatic.    Plan: Recheck echo in 6 months. Continue risk factor modification with blood pressure and lipid management as well as glycemic control. Avoid  dehydration. We reviewed symptoms of angina, heart failure and syncope to watch out for.        Relevant Medications   atorvastatin (LIPITOR) 80 MG tablet   Other Relevant Orders   ECHOCARDIOGRAM COMPLETE   Hyperlipidemia with target LDL less than 70 (Chronic)    LDL is 37.  We can probably reduce her from the 80 mg of atorvastatin down to 40.      Relevant Medications   atorvastatin (LIPITOR) 80 MG tablet   Essential hypertension (Chronic)    Blood pressure is little bit high today, but she tells me at home is better with this.  I have asked her to monitor her blood pressures until she goes back to see her PCP.  Depending what looks like there, may need to consider adding beta-blocker.      Relevant Medications   atorvastatin (LIPITOR) 80 MG tablet   Demand ischemia of myocardium (HCC) (Chronic)    Type II MI in the setting of severe anemia and moderate aortic stenosis.  Moderate nonobstructive disease noted on cath.  no recurrent anginal symptoms.       Relevant Medications   atorvastatin (LIPITOR) 80 MG tablet   Coronary artery disease, non-occlusive (Chronic)    Only moderate disease noted on cath.  No recurrent anginal symptoms. Continue ARB-HCTZ as well as statin Probably okay holding off on aspirin given her history of GI bleed. If her blood pressure continues to be elevated, would consider beta-blocker -probably carvedilol or Bystolic      Relevant Medications   atorvastatin (LIPITOR) 80 MG tablet   Chronic diastolic heart failure (HCC) (Chronic)   Relevant Medications   atorvastatin (LIPITOR) 80 MG tablet   Other Relevant Orders   ECHOCARDIOGRAM COMPLETE      Current medicines are reviewed at length with the patient today. (+/- concerns) n/a The following changes have been made: n/a  Patient Instructions  Medication Instructions:  Decrease Atorvastatin 20 mg( 1/2 tablet  of 40 mg)    Testing/Procedures: Scheduled as [facility] has requested that you have an echocardiogram. Echocardiography is a painless test that uses sound waves to create images of your heart. It provides your doctor with information about the size and shape of your heart and how well your heart's chambers and valves are working. This procedure takes approximately one hour. There are no restrictions for this procedure.    Follow-Up: Your physician wants you to follow-up in: 6 month after ECHO with Dr. Ellyn Hack. You will receive a reminder letter in the mail two months in advance. If you don't receive a letter, please call our office to schedule the follow-up appointment.   Any Other Special Instructions Will Be Listed Below (If Applicable).  Keep a Blood Pressure log to take to PCP for review   If you need a refill on your cardiac medications before your next appointment, please call your pharmacy.     Studies Ordered:   Orders Placed This Encounter  Procedures  . ECHOCARDIOGRAM COMPLETE      Glenetta Hew, M.D., M.S. Interventional Cardiologist   Pager # [phone] Phone # [phone] [address] [place], [place] [address]   Thank you for choosing [facility]!!

## 2017-05-09 ENCOUNTER — Encounter: Payer: Self-pay | Admitting: Cardiology

## 2017-05-09 NOTE — Assessment & Plan Note (Signed)
Certainly she is has had progression of her aortic stenosis since 2017.  We will follow this up again in 6 months to ensure no further progression of disease.  Thankfully, she is not symptomatic.    Plan: Recheck echo in 6 months. Continue risk factor modification with blood pressure and lipid management as well as glycemic control. Avoid dehydration. We reviewed symptoms of angina, heart failure and syncope to watch out for.

## 2017-05-09 NOTE — Assessment & Plan Note (Signed)
The patient understands the need to lose weight with diet and exercise. We have discussed specific strategies for this.  

## 2017-05-09 NOTE — Assessment & Plan Note (Signed)
Only moderate disease noted on cath.  No recurrent anginal symptoms. Continue ARB-HCTZ as well as statin Probably okay holding off on aspirin given her history of GI bleed. If her blood pressure continues to be elevated, would consider beta-blocker -probably carvedilol or Bystolic

## 2017-05-09 NOTE — Assessment & Plan Note (Addendum)
Type II MI in the setting of severe anemia and moderate aortic stenosis.  Moderate nonobstructive disease noted on cath.  no recurrent anginal symptoms.

## 2017-05-09 NOTE — Assessment & Plan Note (Signed)
LDL is 37.  We can probably reduce her from the 80 mg of atorvastatin down to 40.

## 2017-05-09 NOTE — Assessment & Plan Note (Signed)
Blood pressure is little bit high today, but she tells me at home is better with this.  I have asked her to monitor her blood pressures until she goes back to see her PCP.  Depending what looks like there, may need to consider adding beta-blocker.

## 2017-05-09 NOTE — Assessment & Plan Note (Signed)
Mild to moderate pressure noted on current echo as well.  Probably related to left-sided disease, but need to monitor. No significant symptoms  of dyspnea, but she is not overly active.  No signs of cor pulmonale.

## 2017-05-16 ENCOUNTER — Other Ambulatory Visit: Payer: Self-pay | Admitting: Internal Medicine

## 2017-06-18 ENCOUNTER — Telehealth: Payer: Self-pay | Admitting: Internal Medicine

## 2017-06-18 NOTE — Telephone Encounter (Signed)
Ok to change to losartan 100 and HCT 12.5 qd (2 separate rx)  OK for shirron to send #90 each with 3 ref (new prescriptions), but will need to ask pt for a pharmacy it seems

## 2017-06-18 NOTE — Telephone Encounter (Signed)
Copied from CRM [id number]. Topic: Quick Communication - See Telephone Encounter >> Jun 18, 2017  1:02 PM Landry Mellow wrote: CRM for notification. See Telephone encounter for: 06/18/17.  Pt called about losartan-hydrochlorothiazide (HYZAAR) 100-12.5 MG tablet. She was told by pharm that this med is discontinued.  She needs something else.  [facility]arm is [facility] [address]  Cb for pt is [phone]

## 2017-06-19 NOTE — Telephone Encounter (Signed)
Absolutely incorrect.    The losartan she refers to was recalled for ONLY A  FEW BATCHES in oct 2018.  Ok to take the current losartan

## 2017-06-19 NOTE — Telephone Encounter (Signed)
Pt stated that the losartan has been recalled also, she doesn't know what else to do stating that she was on it previously then you switched her to the combo medicaiton. Please advise.

## 2017-06-19 NOTE — Telephone Encounter (Signed)
Pt has been informed and expressed understanding.  

## 2017-07-07 ENCOUNTER — Other Ambulatory Visit: Payer: Self-pay | Admitting: Internal Medicine

## 2017-09-04 ENCOUNTER — Other Ambulatory Visit: Payer: Self-pay

## 2017-09-04 NOTE — Patient Outreach (Signed)
[facility]) Care Management  09/04/2017  [name] 12/11/1940 [id number]   Medication Adherence call to Mary Mccarthy spoke with patient she said she has an appointment at the end of August and she thinks doctor will changing all her medication. [name] is showing past due under [facility] Ins.  Lillia Abed CPhT Pharmacy Technician [facility] Care Management Direct Dial [phone]  Fax [phone] [email or web address]

## 2017-09-11 ENCOUNTER — Other Ambulatory Visit: Payer: Self-pay | Admitting: Internal Medicine

## 2017-09-11 ENCOUNTER — Ambulatory Visit (INDEPENDENT_AMBULATORY_CARE_PROVIDER_SITE_OTHER): Payer: Medicare Other | Admitting: Internal Medicine

## 2017-09-11 ENCOUNTER — Encounter: Payer: Self-pay | Admitting: Internal Medicine

## 2017-09-11 ENCOUNTER — Other Ambulatory Visit (INDEPENDENT_AMBULATORY_CARE_PROVIDER_SITE_OTHER): Payer: Medicare Other

## 2017-09-11 VITALS — BP 126/84 | HR 80 | Temp 98.2°F | Ht 61.0 in | Wt 215.0 lb

## 2017-09-11 DIAGNOSIS — E785 Hyperlipidemia, unspecified: Secondary | ICD-10-CM | POA: Diagnosis not present

## 2017-09-11 DIAGNOSIS — F419 Anxiety disorder, unspecified: Secondary | ICD-10-CM | POA: Insufficient documentation

## 2017-09-11 DIAGNOSIS — E119 Type 2 diabetes mellitus without complications: Secondary | ICD-10-CM | POA: Diagnosis not present

## 2017-09-11 DIAGNOSIS — M810 Age-related osteoporosis without current pathological fracture: Secondary | ICD-10-CM | POA: Diagnosis not present

## 2017-09-11 LAB — HEMOGLOBIN A1C: HEMOGLOBIN A1C: 7.5 % — AB (ref 4.6–6.5)

## 2017-09-11 LAB — HEPATIC FUNCTION PANEL
ALT: 19 U/L (ref 0–35)
AST: 17 U/L (ref 0–37)
Albumin: 3.9 g/dL (ref 3.5–5.2)
Alkaline Phosphatase: 39 U/L (ref 39–117)
Bilirubin, Direct: 0.2 mg/dL (ref 0.0–0.3)
TOTAL PROTEIN: 6.8 g/dL (ref 6.0–8.3)
Total Bilirubin: 1.1 mg/dL (ref 0.2–1.2)

## 2017-09-11 LAB — LIPID PANEL
CHOLESTEROL: 91 mg/dL (ref 0–200)
HDL: 32.6 mg/dL — AB (ref >39.00)
LDL CALC: 38 mg/dL (ref 0–99)
NonHDL: 58.73
TRIGLYCERIDES: 104 mg/dL (ref 0.0–149.0)
Total CHOL/HDL Ratio: 3
VLDL: 20.8 mg/dL (ref 0.0–40.0)

## 2017-09-11 LAB — BASIC METABOLIC PANEL
BUN: 15 mg/dL (ref 6–23)
CHLORIDE: 103 meq/L (ref 96–112)
CO2: 30 mEq/L (ref 19–32)
Calcium: 10.7 mg/dL — ABNORMAL HIGH (ref 8.4–10.5)
Creatinine, Ser: 0.82 mg/dL (ref 0.40–1.20)
GFR: 71.49 mL/min (ref >60.00)
Glucose, Bld: 204 mg/dL — ABNORMAL HIGH (ref 70–99)
Potassium: 4 mEq/L (ref 3.5–5.1)
SODIUM: 139 meq/L (ref 135–145)

## 2017-09-11 MED ORDER — PANTOPRAZOLE SODIUM 40 MG PO TBEC: 40.0000 mg | DELAYED_RELEASE_TABLET | Freq: Every day | ORAL | 3 refills | Status: DC

## 2017-09-11 MED ORDER — METFORMIN HCL 1000 MG PO TABS: ORAL_TABLET | ORAL | 3 refills | Status: DC

## 2017-09-11 MED ORDER — ATORVASTATIN CALCIUM 80 MG PO TABS: 40.0000 mg | ORAL_TABLET | Freq: Every day | ORAL | 3 refills | Status: DC

## 2017-09-11 MED ORDER — CITALOPRAM HYDROBROMIDE 10 MG PO TABS: 10.0000 mg | ORAL_TABLET | Freq: Every day | ORAL | 3 refills | Status: DC

## 2017-09-11 NOTE — Progress Notes (Signed)
Subjective:    Patient ID: Mary Mccarthy, female    DOB: 1940-06-03, 77 y.o.   MRN: [id number]  HPI  Here to f/u; overall doing ok,  Pt denies chest pain, increasing sob or doe, wheezing, orthopnea, PND, increased LE swelling, palpitations, dizziness or syncope.  Pt denies new neurological symptoms such as new headache, or facial or extremity weakness or numbness.  Pt denies polydipsia, polyuria, or low sugar episode.  Pt states overall good compliance with meds, mostly trying to follow appropriate diet, with wt overall stable,  but little exercise however.  CBGs have been slighlty higher to mid and upper 100's occcasionally.  Gained 5 lbs recently due to excess calories, she states due to stress eating. Denies worsening depressive symptoms, suicidal ideation, or panic; has ongoing anxiety Wt Readings from Last 3 Encounters:  09/11/17 215 lb (97.5 kg)  05/07/17 211 lb (95.7 kg)  03/30/17 210 lb (95.3 kg)  Has been putting off yearly eye exam due to finances  Has f/u mod to severe AS with Dr Ellyn Hack in oct 2019.  No other new complaints except has some mild GI upset recently on the fosamax when taking it once weekly.  . Past Medical History:  Diagnosis Date  . ALLERGIC RHINITIS 06/24/2007  . Anemia   . AVM (arteriovenous malformation) of colon   . Blood transfusion without reported diagnosis 05/2015  . COLONIC POLYPS, HX OF 06/24/2007  . Coronary artery disease, non-occlusive 05/2015   Positive troponin in setting of acute anemia.  Cardiac cath showed ostial ramus 60%, ostial RCA 30% and distal LAD 40 and 50%.  . DEPRESSION 10/08/2006  . DIABETES MELLITUS, TYPE II 10/05/2006  . GERD 06/24/2007  . HYPERLIPIDEMIA 10/08/2006  . HYPERTENSION 10/08/2006  . INSOMNIA 09/24/2008  . Left knee DJD   . Moderate to severe aortic stenosis 05/2015   a) Moderate aortic stenosis (mean gradient 22 mmHg, peak 42 mmHg -AVA 1.1-1.2 cm.).;;b) 04/2017: Severe calcified aortic valve with moderate-severe aortic stenosis  (peak-mean gradient 60-34 mmHg -moderate), calculated AVA 0.8 cm -severe..  . Myocardial infarction ([place]) 05/2015   Type 2 MI - related to GI Bleed / Anemia (non-obstructive CAD by Cath)  . Osteoporosis 03/10/2016  . PEPTIC ULCER DISEASE 10/08/2006   Past Surgical History:  Procedure Laterality Date  . APPENDECTOMY    . BREAST BIOPSY    . CARDIAC CATHETERIZATION N/A 05/21/2015   Procedure: Left Heart Cath and Coronary Angiography;  Surgeon: Leonie Man, MD;  Location: [place] [facility];  Service: Cardiovascular;: Ost RI 60%, Ost RCA 30%, dLAD tapers to small vessel w/ 40-50%. Mildly elevated LVEDP. Normal LV Fxn.  . COLONOSCOPY N/A 05/20/2015   Procedure: COLONOSCOPY;  Surgeon: Manus Gunning, MD;  Location: [place];  Service: Gastroenterology;  Laterality: N/A;  . ESOPHAGOGASTRODUODENOSCOPY N/A 05/20/2015   Procedure: ESOPHAGOGASTRODUODENOSCOPY (EGD);  Surgeon: Manus Gunning, MD;  Location: [place];  Service: Gastroenterology;  Laterality: N/A;  . TONSILLECTOMY    . TRANSTHORACIC ECHOCARDIOGRAM  10/'15; 4/'17   a) mild AS, mildly elevated PA pressures.;; b)  Moderate LVH.  EF 55-60%.  Moderate aortic stenosis (mean gradient 22 mmHg, peak 42 mmHg -AVA 1.1-1.2 cm.).  Severe LA dilation.  Moderate TR with RVSP peak 51 mmHg and dilated IVC  . TRANSTHORACIC ECHOCARDIOGRAM  04/12/2017   EF 60-65%. No RWMA.  GR 1 DD.  Moderate concentric LVH.  Mild LA dilation. Severe calcified aortic valve with moderate-severe aortic stenosis (peak/mean gradients 60/34 mmHg) and in  severe range by AVA (0.8 cm2).  Mild to moderately increased PA pressures 41 mmHg with normal RV size and function..    . TUBAL LIGATION      reports that she has never smoked. She has never used smokeless tobacco. She reports that she does not drink alcohol or use drugs. family history includes Alcohol abuse in her other; Arrhythmia in her sister; Arthritis in her other; CAD in her sister; Cancer in her  mother; Depression in her other; Diabetes in her other; Heart attack (age of onset: 61) in her sister; Heart disease in her other; Hyperlipidemia in her other; Hypertension in her other; Stroke in her other. Allergies  Allergen Reactions  . Ibuprofen Other (See Comments)    Bleeding events   Current Outpatient Medications on File Prior to Visit  Medication Sig Dispense Refill  . acetaminophen (TYLENOL) 500 MG tablet Take 500 mg by mouth every 6 (six) hours as needed for moderate pain.    . Blood Glucose Monitoring Suppl (ONE TOUCH ULTRA 2) w/Device KIT Use as directed daily 1 each 0  . clonazePAM (KLONOPIN) 0.5 MG tablet take 1 TABLET BY MOUTH TWICE DAILY AS NEEDED for anxiety 60 tablet 2  . ONE TOUCH ULTRA TEST test strip Use as instructed Test 1-2 times a day 200 each 11  . ONETOUCH DELICA LANCETS 62I MISC use as directed test 1-2 times a day  0  . vitamin C (VITAMIN C) 500 MG tablet Take 1 tablet (500 mg total) by mouth 2 (two) times daily. 60 tablet 0  . glimepiride (AMARYL) 2 MG tablet Take 1 tablet (2 mg total) by mouth daily before breakfast. (Patient not taking: Reported on 09/11/2017) 90 tablet 3   No current facility-administered medications on file prior to visit.    Review of Systems  Constitutional: Negative for other unusual diaphoresis or sweats HENT: Negative for ear discharge or swelling Eyes: Negative for other worsening visual disturbances Respiratory: Negative for stridor or other swelling  Gastrointestinal: Negative for worsening distension or other blood Genitourinary: Negative for retention or other urinary change Musculoskeletal: Negative for other MSK pain or swelling Skin: Negative for color change or other new lesions Neurological: Negative for worsening tremors and other numbness  Psychiatric/Behavioral: Negative for worsening agitation or other fatigue All other system neg per pt    Objective:   Physical Exam BP 126/84   Pulse 80   Temp 98.2 F (36.8 C)  (Oral)   Ht '5\' 1"'$  (1.549 m)   Wt 215 lb (97.5 kg)   SpO2 97%   BMI 40.62 kg/m  VS noted,  Constitutional: Pt appears in NAD HENT: Head: NCAT.  Right Ear: External ear normal.  Left Ear: External ear normal.  Eyes: . Pupils are equal, round, and reactive to light. Conjunctivae and EOM are normal Nose: without d/c or deformity Neck: Neck supple. Gross normal ROM Cardiovascular: Normal rate and regular rhythm.   Pulmonary/Chest: Effort normal and breath sounds without rales or wheezing.  Abd:  Soft, NT, ND, + BS, no organomegaly Neurological: Pt is alert. At baseline orientation, motor grossly intact Skin: Skin is warm. No rashes, other new lesions, no LE edema Psychiatric: Pt behavior is normal without agitation , 2+ nervous No other exam findings    Assessment & Plan:

## 2017-09-11 NOTE — Assessment & Plan Note (Signed)
Ok for add celexa 10 qd

## 2017-09-11 NOTE — Assessment & Plan Note (Signed)
stable overall by history and exam, recent data reviewed with pt, and pt to continue medical treatment as before,  to f/u any worsening symptoms or concerns Lab Results  Component Value Date   LDLCALC 38 09/11/2017

## 2017-09-11 NOTE — Patient Instructions (Addendum)
Ok to stop the fosamax (the weekly pill)  Please take all new medication as prescribed - the citalopram 10 mg per day for nerves  Please continue all other medications as before, and refills have been done if requested.  Please have the pharmacy call with any other refills you may need.  Please continue your efforts at being more active, low cholesterol diet, and weight control.  Please keep your appointments with your specialists as you may have planned  Please go to the LAB in the Basement (turn left off the elevator) for the tests to be done today  You will be contacted by phone if any changes need to be made immediately.  Otherwise, you will receive a letter about your results with an explanation, but please check with MyChart first.  Please remember to sign up for MyChart if you have not done so, as this will be important to you in the future with finding out test results, communicating by private email, and scheduling acute appointments online when needed.  Please return in 6 months, or sooner if needed, with Lab testing done 3-5 days before

## 2017-09-11 NOTE — Assessment & Plan Note (Signed)
Lab Results  Component Value Date   HGBA1C 7.5 (H) 09/11/2017  stable overall by history and exam, recent data reviewed with pt, and pt to continue medical treatment as before,  to f/u any worsening symptoms or concerns

## 2017-09-11 NOTE — Assessment & Plan Note (Signed)
Ok for d/c fosamax due to GI upset

## 2017-09-21 ENCOUNTER — Other Ambulatory Visit: Payer: Self-pay | Admitting: Internal Medicine

## 2017-09-21 NOTE — Telephone Encounter (Signed)
Done erx 

## 2017-10-17 ENCOUNTER — Ambulatory Visit (INDEPENDENT_AMBULATORY_CARE_PROVIDER_SITE_OTHER): Payer: Medicare Other | Admitting: *Deleted

## 2017-10-17 DIAGNOSIS — Z23 Encounter for immunization: Secondary | ICD-10-CM | POA: Diagnosis not present

## 2017-10-18 ENCOUNTER — Other Ambulatory Visit: Payer: Self-pay | Admitting: Internal Medicine

## 2017-11-06 ENCOUNTER — Ambulatory Visit (HOSPITAL_COMMUNITY): Payer: Medicare Other | Attending: Cardiology

## 2017-11-06 ENCOUNTER — Other Ambulatory Visit: Payer: Self-pay

## 2017-11-06 DIAGNOSIS — I2510 Atherosclerotic heart disease of native coronary artery without angina pectoris: Secondary | ICD-10-CM | POA: Insufficient documentation

## 2017-11-06 DIAGNOSIS — I252 Old myocardial infarction: Secondary | ICD-10-CM | POA: Insufficient documentation

## 2017-11-06 DIAGNOSIS — E785 Hyperlipidemia, unspecified: Secondary | ICD-10-CM | POA: Diagnosis not present

## 2017-11-06 DIAGNOSIS — I082 Rheumatic disorders of both aortic and tricuspid valves: Secondary | ICD-10-CM | POA: Insufficient documentation

## 2017-11-06 DIAGNOSIS — I350 Nonrheumatic aortic (valve) stenosis: Secondary | ICD-10-CM | POA: Diagnosis not present

## 2017-11-06 DIAGNOSIS — I119 Hypertensive heart disease without heart failure: Secondary | ICD-10-CM | POA: Diagnosis not present

## 2017-11-06 DIAGNOSIS — I5032 Chronic diastolic (congestive) heart failure: Secondary | ICD-10-CM

## 2017-11-06 DIAGNOSIS — E119 Type 2 diabetes mellitus without complications: Secondary | ICD-10-CM | POA: Insufficient documentation

## 2017-11-06 DIAGNOSIS — I272 Pulmonary hypertension, unspecified: Secondary | ICD-10-CM

## 2017-11-12 ENCOUNTER — Ambulatory Visit: Payer: Medicare Other | Admitting: Cardiology

## 2017-11-12 ENCOUNTER — Other Ambulatory Visit: Payer: Self-pay

## 2017-11-12 ENCOUNTER — Encounter: Payer: Self-pay | Admitting: Cardiology

## 2017-11-12 VITALS — BP 198/100 | HR 78 | Ht 61.0 in | Wt 211.0 lb

## 2017-11-12 DIAGNOSIS — E785 Hyperlipidemia, unspecified: Secondary | ICD-10-CM | POA: Diagnosis not present

## 2017-11-12 DIAGNOSIS — I10 Essential (primary) hypertension: Secondary | ICD-10-CM | POA: Diagnosis not present

## 2017-11-12 DIAGNOSIS — I350 Nonrheumatic aortic (valve) stenosis: Secondary | ICD-10-CM

## 2017-11-12 DIAGNOSIS — I2510 Atherosclerotic heart disease of native coronary artery without angina pectoris: Secondary | ICD-10-CM

## 2017-11-12 DIAGNOSIS — I272 Pulmonary hypertension, unspecified: Secondary | ICD-10-CM

## 2017-11-12 DIAGNOSIS — Z79899 Other long term (current) drug therapy: Secondary | ICD-10-CM

## 2017-11-12 MED ORDER — TELMISARTAN 40 MG PO TABS: 40.0000 mg | ORAL_TABLET | Freq: Every day | ORAL | 6 refills | Status: DC

## 2017-11-12 NOTE — Patient Outreach (Signed)
[facility]) Care Management  11/12/2017  Mary Mccarthy 1940-04-16 [id number]   Medication Adherence call to Mary Mccarthy spoke with patient she is no longer taking this medication Lisinopril /Hctz 100/25 mg doctor took her off. Mary Mccarthy is showing past due under [facility].   Lillia Abed CPhT Pharmacy Technician [facility] Care Management Direct Dial [phone]  Fax [phone] [email or web address]

## 2017-11-12 NOTE — Progress Notes (Signed)
PCP: Mary Man, MD  Clinic Note: No chief complaint on file.   HPI: Mary Mccarthy is a 77 y.o. female who is being seen today for follow-up evaluation of Moderate to Severe Aortic Stenosis.  She was initially seen at the request of Mary Borg, MD - in Feb 2019.  Mary Mccarthy has a h/o SVT & Mod AS with mild Non-obstructive CAD by Cath (following Type 2 MI)  April 2017 - rapid heartbeat/SVT with diffuse "ischemic" changes (Seen by Mary Mccarthy).  --> ++ Troponin ~6.5 with, Severe Anemia - 4 units PRBC. Following GI procedure to cauterize lesions, CATH: (performed by me) => mild to moderate nonobstructive CAD --> optimize medical management.  She was also noted to have moderate aortic stenosis on echocardiogram.  Her family history is notable for the fact that her sister Mary Mccarthy) is a well-known patient to our practice who sees Dr. Sallyanne Mccarthy.  She has significant CAD and arrhythmia issues.  Mary Mccarthy was seen in April after initial Echo.  He is doing fairly well from a cardiac standpoint.  Had premature recovered.  Recent Hospitalizations:  None  Studies Personally Reviewed - (if available, images/films reviewed: From Epic Chart or Care Everywhere).     Echo 11/06/17: EF 60-65%. No RWMA.  Mod Conc LVH. Gr 1 DD.   Moderate-Severe AS (Mean gradient 36 mmHg).  Mod LA dilation. Mild-Mod PA HTN.  Interval History: Mary Mccarthy presents here today for follow-up after 6 month f/u echocardiogram to follow-up her aortic stenosis.  She is pretty much stable from a cardiac standpoint.  She is not had no issues with chest tightness pressure rest or exertion.  She may get short of breath if she goes up and down stairs or exerts herself significantly, but not with routine activity.  She has been under quite a bit of stress had with a caregiver for her husband who is been sick.  Moving him exhausted. She denies any chest tightness pressure rest or exertion.  No rapid  irregular heartbeats palpitations.  No syncope/near syncope or TIA/amaurosis fugax symptoms.  No melena, hematochezia, hematuria or epistaxis..    BP is again quite high today.  But not able to provide a great excuse besides stress from home.  ROS: A comprehensive was performed. Review of Systems  Constitutional: Negative for chills, diaphoresis, fever and malaise/fatigue (Just occasional exercise intolerance).  HENT: Negative for congestion and nosebleeds.   Eyes: Negative for blurred vision.  Respiratory: Negative for cough, shortness of breath (Only with exertion) and wheezing.   Gastrointestinal: Negative for abdominal pain, blood in stool, constipation, heartburn and melena.  Genitourinary: Negative for dysuria and hematuria.  Musculoskeletal: Positive for joint pain. Negative for myalgias.  Skin: Negative.   Neurological: Negative for dizziness and focal weakness.  Psychiatric/Behavioral: Negative for depression, hallucinations and suicidal ideas. The patient is nervous/anxious (Lots of social stress).        Recent argument -- a bit upset this AM.  All other systems reviewed and are negative.  I have reviewed and (if needed) personally updated the patient's problem list, medications, allergies, past medical and surgical history, social and family history.   Past Medical History:  Diagnosis Date  . ALLERGIC RHINITIS 06/24/2007  . Anemia   . AVM (arteriovenous malformation) of colon   . Blood transfusion without reported diagnosis 05/2015  . COLONIC POLYPS, HX OF 06/24/2007  . Coronary artery disease, non-occlusive 05/2015   Positive troponin in setting of acute anemia.  Cardiac cath showed ostial ramus 60%, ostial RCA 30% and distal LAD 40 and 50%.  . DEPRESSION 10/08/2006  . DIABETES MELLITUS, TYPE II 10/05/2006  . GERD 06/24/2007  . HYPERLIPIDEMIA 10/08/2006  . HYPERTENSION 10/08/2006  . INSOMNIA 09/24/2008  . Left knee DJD   . Moderate to severe aortic stenosis 05/2015   a)  Moderate aortic stenosis (mean gradient 22 mmHg, peak 42 mmHg -AVA 1.1-1.2 cm.).;;b) 04/2017: Severe calcified aortic valve with moderate-severe aortic stenosis (peak-mean gradient 60-34 mmHg -moderate), calculated AVA 0.8 cm -severe..  . Myocardial infarction ([place]) 05/2015   Type 2 MI - related to GI Bleed / Anemia (non-obstructive CAD by Cath)  . Osteoporosis 03/10/2016  . PEPTIC ULCER DISEASE 10/08/2006    Past Surgical History:  Procedure Laterality Date  . APPENDECTOMY    . BREAST BIOPSY    . CARDIAC CATHETERIZATION N/A 05/21/2015   Procedure: Left Heart Cath and Coronary Angiography;  Surgeon: Mary Man, MD;  Location: [place] [facility];  Service: Cardiovascular;: Ost RI 60%, Ost RCA 30%, dLAD tapers to small vessel w/ 40-50%. Mildly elevated LVEDP. Normal LV Fxn.  . COLONOSCOPY N/A 05/20/2015   Procedure: COLONOSCOPY;  Surgeon: Mary Gunning, MD;  Location: [place];  Service: Gastroenterology;  Laterality: N/A;  . ESOPHAGOGASTRODUODENOSCOPY N/A 05/20/2015   Procedure: ESOPHAGOGASTRODUODENOSCOPY (EGD);  Surgeon: Mary Gunning, MD;  Location: [place];  Service: Gastroenterology;  Laterality: N/A;  . TONSILLECTOMY    . TRANSTHORACIC ECHOCARDIOGRAM  10/'15; 4/'17   a) mild AS, mildly elevated PA pressures.;; b)  Moderate LVH.  EF 55-60%.  Moderate aortic stenosis (mean gradient 22 mmHg, peak 42 mmHg -AVA 1.1-1.2 cm.).  Severe LA dilation.  Moderate TR with RVSP peak 51 mmHg and dilated IVC  . TRANSTHORACIC ECHOCARDIOGRAM  04/12/2017   EF 60-65%. No RWMA.  GR 1 DD.  Moderate concentric LVH.  Mild LA dilation. Severe calcified aortic valve with moderate-severe aortic stenosis (peak/mean gradients 60/34 mmHg) and in severe range by AVA (0.8 cm2).  Mild to moderately increased PA pressures 41 mmHg with normal RV size and function..    . TUBAL LIGATION      Current Meds  Medication Sig  . acetaminophen (TYLENOL) 500 MG tablet Take 500 mg by mouth every 6 (six)  hours as needed for moderate pain.  Marland Kitchen atorvastatin (LIPITOR) 80 MG tablet Take 0.5 tablets (40 mg total) by mouth daily at 6 PM.  . Blood Glucose Monitoring Suppl (ONE TOUCH ULTRA 2) w/Device KIT Use as directed daily  . citalopram (CELEXA) 10 MG tablet Take 1 tablet (10 mg total) by mouth daily.  . clonazePAM (KLONOPIN) 0.5 MG tablet TAKE 1 TABLET BY MOUTH TWICE DAILY AS NEEDED FOR ANXIETY  . glimepiride (AMARYL) 2 MG tablet TAKE 1 TABLET (2 MG TOTAL) BY MOUTH DAILY BEFORE BREAKFAST.  . metFORMIN (GLUCOPHAGE) 1000 MG tablet 1 TAB BY MOUTH Twice per day  . ONE TOUCH ULTRA TEST test strip Use as instructed Test 1-2 times a day  . ONETOUCH DELICA LANCETS 35T MISC use as directed test 1-2 times a day  . pantoprazole (PROTONIX) 40 MG tablet Take 1 tablet (40 mg total) by mouth daily.  . vitamin C (VITAMIN C) 500 MG tablet Take 1 tablet (500 mg total) by mouth 2 (two) times daily.    Allergies  Allergen Reactions  . Ibuprofen Other (See Comments)    Bleeding events    Social History   Tobacco Use  . Smoking status: Never  Smoker  . Smokeless tobacco: Never Used  Substance Use Topics  . Alcohol use: No    Alcohol/week: 0.0 standard drinks  . Drug use: No   Social History   Social History Narrative  . Not on file   Significant social stressor: 61 year old nephew recently committed suicide (even more concerning because her first husband also committed suicide).  Also her sister Mikle Bosworth has become acutely ill.   family history includes Alcohol abuse in her other; Arrhythmia in her sister; Arthritis in her other; CAD in her sister; Cancer in her mother; Depression in her other; Diabetes in her other; Heart attack (age of onset: 109) in her sister; Heart disease in her other; Hyperlipidemia in her other; Hypertension in her other; Stroke in her other.  Wt Readings from Last 3 Encounters:  11/12/17 211 lb (95.7 kg)  09/11/17 215 lb (97.5 kg)  05/07/17 211 lb (95.7 kg)    PHYSICAL  EXAM BP (!) 198/100   Pulse 78   Ht '5\' 1"'$  (1.549 m)   Wt 211 lb (95.7 kg)   BMI 39.87 kg/m  Physical Exam  Constitutional: She is oriented to person, place, and time. She appears well-developed and well-nourished. No distress.  Appears relatively healthy.  Well-groomed.  HENT:  Head: Normocephalic and atraumatic.  Mouth/Throat: Oropharynx is clear and moist.  Neck: Normal range of motion. Neck supple. No hepatojugular reflux and no JVD present. Carotid bruit is not present (Radiated aortic murmur). No thyromegaly present.  Cardiovascular: Normal rate and intact distal pulses.  No extrasystoles are present. PMI is not displaced. Exam reveals no gallop and no friction rub.  Murmur heard. High-pitched harsh crescendo-decrescendo midsystolic murmur is present with a grade of 2/6 at the upper right sternal border radiating to the neck. Pulmonary/Chest: Effort normal and breath sounds normal. No respiratory distress. She has no wheezes. She has no rales.  Musculoskeletal: Normal range of motion. She exhibits no edema.  Neurological: She is alert and oriented to person, place, and time.  Psychiatric: She has a normal mood and affect. Her behavior is normal. Judgment and thought content normal.  Nursing note and vitals reviewed.    Adult ECG Report Not checked  Other studies Reviewed: Additional studies/ records that were reviewed today include:  Recent Labs:    Lab Results  Component Value Date   CHOL 91 09/11/2017   HDL 32.60 (L) 09/11/2017   LDLCALC 38 09/11/2017   TRIG 104.0 09/11/2017   CHOLHDL 3 09/11/2017   Lab Results  Component Value Date   HGBA1C 7.5 (H) 09/11/2017    ASSESSMENT / PLAN: Problem List Items Addressed This Visit    Coronary artery disease, non-occlusive (Chronic)    Minimal disease by cath.  No anginal symptoms.  Apparently she had been on an ARB before but no longer on one now.  We will restart ARB.  Would also probably benefit from beta-blocker. Would  like to see how she does with adding ARB first, and then assess for possible beta-blocker next.  Also on statin managed by PCP.  Holding off on baby aspirin because of history of GI bleed.      Relevant Medications   telmisartan (MICARDIS) 40 MG tablet   Other Relevant Orders   Basic metabolic panel   Essential hypertension (Chronic)    Blood pressure still high.  Not on any medicines.  Not resolution of how this happened. Plan: Start Micardis 40 mg tablets tapering from 1/2 tablet up to full tablet.  Next  would be to consider adding carvedilol.  She will follow-up in CV [facility] for blood pressure management in roughly 6-8 weeks.  Hopefully by that time she will be up to taking 40 mg of Micardis and will be to see if the next medication can be started (carvedilol)      Relevant Medications   telmisartan (MICARDIS) 40 MG tablet   Other Relevant Orders   Basic metabolic panel   Hyperlipidemia with target LDL less than 70 (Chronic)    Target LDL is less than 100 close to 70.  Currently on 40 mg atorvastatin.  Most recent LDL was well within goal.  No change.      Relevant Medications   telmisartan (MICARDIS) 40 MG tablet   Moderate to severe aortic stenosis - Primary (Chronic)    No real progression of disease.  Okay to then switch back to do annual echo follow-up. Cardiac risk factor management with blood pressure, glycemic and lipid control.      Relevant Medications   telmisartan (MICARDIS) 40 MG tablet   Morbid obesity (HCC) (Chronic)    The patient understands the need to lose weight with diet and exercise. We have discussed specific strategies for this. Discussed importance of actually getting out and exercising and monitor her p.o. intake.      Pulmonary hypertension (HCC) (Chronic)    Noted on echocardiogram is mild to moderately elevated.  She gets some exertional dyspnea but I suspect this may be related to diastolic dysfunction versus true pulmonary retention. Main option  he will be to continue to treat systemic hypertension first.      Relevant Medications   telmisartan (MICARDIS) 40 MG tablet    Other Visit Diagnoses    Medication management       Relevant Orders   Basic metabolic panel      Current medicines are reviewed at length with the patient today. (+/- concerns) n/a The following changes have been made: n/a  Patient Instructions  Medication Instructions:   New medicine: Micardis 40 mg  --> TAKE 1/2 TABLET ( 20 MG )  FOR 2 WEEKS , THEN HAVE LABWORK DONE, CONTINUE UNTIL OFFICE CALLS YOU WITH RESULTS - YOU WILL PROBABLY INCREASE TO A WHOLE PILL ( 40 MG) DAILY  If you need a refill on your cardiac medications before your next appointment, please call your pharmacy.   Lab work:  [address] [place] ON" [address]"  If you have labs (blood work) drawn today and your tests are completely normal, you will receive your results only by: Marland Kitchen MyChart Message (if you have MyChart) OR . A paper copy in the mail If you have any lab test that is abnormal or we need to change your treatment, we will call you to review the results.    Testing/Procedures: NOT NEEDED   Follow-Up: Your physician recommends that you schedule a follow-up appointment in 6-8 [place] - BLOOD PRESSURE  At [facility], you and your health needs are our priority.  As part of our continuing mission to provide you with exceptional heart care, we have created designated Provider Care Teams.  These Care Teams include your primary Cardiologist (physician) and Advanced Practice Providers (APPs -  Physician Assistants and Nurse Practitioners) who all work together to provide you with the care you need, when you need it. You will need a follow up appointment in 6 months (April 2020).  Please call our office 2 months in advance  to schedule this appointment.  You may see Glenetta Hew, MD  or one of the following Advanced Practice Providers on your  designated Care Team:   Rosaria Ferries, PA-C . Jory Sims, DNP, .   Any Other Special Instructions Will Be Listed Below .   NOT NEEDED      Studies Ordered:   Orders Placed This Encounter  Procedures  . Basic metabolic panel      Glenetta Hew, M.D., M.S. Interventional Cardiologist   Pager # [phone] Phone # [phone] [address] [place], [place] [address]   Thank you for choosing Heart[facility] at [facility]!!

## 2017-11-12 NOTE — Patient Instructions (Addendum)
Medication Instructions:   New medicine: Micardis 40 mg  --> TAKE 1/2 TABLET ( 20 MG )  FOR 2 WEEKS , THEN HAVE LABWORK DONE, CONTINUE UNTIL OFFICE CALLS YOU WITH RESULTS - YOU WILL PROBABLY INCREASE TO A WHOLE PILL ( 40 MG) DAILY  If you need a refill on your cardiac medications before your next appointment, please call your pharmacy.   Lab work:  2 WEEKS - BMP  PLEASE FOLLOW INSTRUCTION ON" ORANGE PAPER"  If you have labs (blood work) drawn today and your tests are completely normal, you will receive your results only by: Marland Kitchen MyChart Message (if you have MyChart) OR . A paper copy in the mail If you have any lab test that is abnormal or we need to change your treatment, we will call you to review the results.    Testing/Procedures: NOT NEEDED   Follow-Up: Your physician recommends that you schedule a follow-up appointment in 6-8 WEEKS WITH CVRR - BLOOD PRESSURE  At [facility], you and your health needs are our priority.  As part of our continuing mission to provide you with exceptional heart care, we have created designated Provider Care Teams.  These Care Teams include your primary Cardiologist (physician) and Advanced Practice Providers (APPs -  Physician Assistants and Nurse Practitioners) who all work together to provide you with the care you need, when you need it. You will need a follow up appointment in 6 months (April 2020).  Please call our office 2 months in advance to schedule this appointment.  You may see Bryan Lemma, MD  or one of the following Advanced Practice Providers on your designated Care Team:   Theodore Demark, PA-C . Joni Reining, DNP, .   Any Other Special Instructions Will Be Listed Below .   NOT NEEDED

## 2017-11-14 NOTE — Assessment & Plan Note (Signed)
The patient understands the need to lose weight with diet and exercise. We have discussed specific strategies for this. Discussed importance of actually getting out and exercising and monitor her p.o. intake.

## 2017-11-14 NOTE — Assessment & Plan Note (Signed)
Minimal disease by cath.  No anginal symptoms.  Apparently she had been on an ARB before but no longer on one now.  We will restart ARB.  Would also probably benefit from beta-blocker. Would like to see how she does with adding ARB first, and then assess for possible beta-blocker next.  Also on statin managed by PCP.  Holding off on baby aspirin because of history of GI bleed.

## 2017-11-14 NOTE — Assessment & Plan Note (Signed)
Target LDL is less than 100 close to 70.  Currently on 40 mg atorvastatin.  Most recent LDL was well within goal.  No change.

## 2017-11-14 NOTE — Assessment & Plan Note (Signed)
Noted on echocardiogram is mild to moderately elevated.  She gets some exertional dyspnea but I suspect this may be related to diastolic dysfunction versus true pulmonary retention. Main option he will be to continue to treat systemic hypertension first.

## 2017-11-14 NOTE — Assessment & Plan Note (Addendum)
Blood pressure still high.  Not on any medicines.  Not resolution of how this happened. Plan: Start Micardis 40 mg tablets tapering from 1/2 tablet up to full tablet.  Next would be to consider adding carvedilol.  She will follow-up in CV RR for blood pressure management in roughly 6-8 weeks.  Hopefully by that time she will be up to taking 40 mg of Micardis and will be to see if the next medication can be started (carvedilol)

## 2017-11-14 NOTE — Assessment & Plan Note (Signed)
No real progression of disease.  Okay to then switch back to do annual echo follow-up. Cardiac risk factor management with blood pressure, glycemic and lipid control.

## 2017-11-22 DIAGNOSIS — I10 Essential (primary) hypertension: Secondary | ICD-10-CM | POA: Diagnosis not present

## 2017-11-22 DIAGNOSIS — I2510 Atherosclerotic heart disease of native coronary artery without angina pectoris: Secondary | ICD-10-CM | POA: Diagnosis not present

## 2017-11-22 DIAGNOSIS — Z79899 Other long term (current) drug therapy: Secondary | ICD-10-CM | POA: Diagnosis not present

## 2017-11-22 LAB — BASIC METABOLIC PANEL
BUN/Creatinine Ratio: 17 (ref 12–28)
BUN: 12 mg/dL (ref 8–27)
CO2: 21 mmol/L (ref 20–29)
Calcium: 10.2 mg/dL (ref 8.7–10.3)
Chloride: 104 mmol/L (ref 96–106)
Creatinine, Ser: 0.72 mg/dL (ref 0.57–1.00)
GFR calc Af Amer: 92 mL/min/{1.73_m2} (ref >59)
GFR calc non Af Amer: 80 mL/min/{1.73_m2} (ref >59)
Glucose: 107 mg/dL — ABNORMAL HIGH (ref 65–99)
Potassium: 4.5 mmol/L (ref 3.5–5.2)
Sodium: 141 mmol/L (ref 134–144)

## 2017-12-24 ENCOUNTER — Ambulatory Visit: Payer: Medicare Other

## 2017-12-25 ENCOUNTER — Ambulatory Visit (INDEPENDENT_AMBULATORY_CARE_PROVIDER_SITE_OTHER): Payer: Medicare Other | Admitting: Pharmacist Clinician (PhC)/ Clinical Pharmacy Specialist

## 2017-12-25 DIAGNOSIS — I10 Essential (primary) hypertension: Secondary | ICD-10-CM

## 2017-12-25 MED ORDER — CARVEDILOL 6.25 MG PO TABS: 6.2500 mg | ORAL_TABLET | Freq: Two times a day (BID) | ORAL | 5 refills | Status: DC

## 2017-12-25 MED ORDER — CHLORTHALIDONE 25 MG PO TABS: 12.5000 mg | ORAL_TABLET | Freq: Every day | ORAL | 6 refills | Status: DC

## 2017-12-25 NOTE — Patient Instructions (Signed)
Return for a a follow up appointment in 2 weeks - December 5  Your blood pressure today is 242/102  Take your BP meds as follows:  AM:  Carvedilol 6.25 mg, chlorthalidone 12.5 mg (1/2 tab), glimiperide, metformin, pantoprazole   PM:  Carvedilol 6.25 mg, atorvastatin (1/2 tab), metformin, telmisartan  Bring all of your meds, your BP cuff and your record of home blood pressures to your next appointment.  Exercise as you're able, try to walk approximately 30 minutes per day.  Keep salt intake to a minimum, especially watch canned and prepared boxed foods.  Eat more fresh fruits and vegetables and fewer canned items.  Avoid eating in fast food restaurants.    HOW TO TAKE YOUR BLOOD PRESSURE: . Rest 5 minutes before taking your blood pressure. .  Don't smoke or drink caffeinated beverages for at least 30 minutes before. . Take your blood pressure before (not after) you eat. . Sit comfortably with your back supported and both feet on the floor (don't cross your legs). . Elevate your arm to heart level on a table or a desk. . Use the proper sized cuff. It should fit smoothly and snugly around your bare upper arm. There should be enough room to slip a fingertip under the cuff. The bottom edge of the cuff should be 1 inch above the crease of the elbow. . Ideally, take 3 measurements at one sitting and record the average.

## 2017-12-25 NOTE — Progress Notes (Signed)
12/25/2017 Mary Mccarthy [birth year] [id number]   HPI:  Mary Mccarthy is a 77 y.o. female patient of Dr Ellyn Hack, with a PMH below who presents today for hypertension clinic evaluation.  In addition to hypertension, her medical history is significant for chronic diastolic heart failure, hyperlipidemia, pulmonary hypertension, non-occlusive CAD, anemia, DM2, osteoporosis and obesity.  When she saw Dr. Ellyn Hack last month her BP in office was 198/100.  Patient has not had such an elevated reading before, and believes due to some stresses in her life.  She has become the primary caregiver for her husband and a nephew recently committed suicide.   Her identical twin sister is currently living in a skilled nursing facility and Rusti is regularly visiting her as well as doing her laundry.  It appears that much of her family, including nephews, rely on her for too much.    Dr. Ellyn Hack started her on telmisartan 40 mg, suggesting she take 1/2 tablet for the first week or two, then increase to 1 tablet daily.  She returns today for follow up.   She has not had any problems with the new medication.  Unfortunately it has not had much impact on her blood pressure.  Patient states that she has had blood pressure issues off and on since having pre-eclampsia at age 73.    10:35 gave clonidine 0.1 mg  Blood Pressure Goal:  130/80  Current Medications:  telmisartan 40 mg qd - evenings  Family Hx:  Twin sister - Geralene CAD  Hypertension, heart disease in dad (dad died from [place]), 4 brothers, 3 died with heart disease, 1 living with heart disease  Social Hx:  No tobacco, but husband regular indoor smoker; no alcohol; lots of coffee - up to 5 cups per day; soda 2-3 cans per week  Diet:  Admits to junk food addict - loves cookies; sweet foods not salty;  Mostly home cooked , no added salt; canned veggies.    Exercise:  Some yard work, takes care of husband,   Home BP readings:  No home cuff -  states she bought and returned 3 different cuffs due to inaccuracies when compared to MD offices  Intolerances:   No cardiac medication intolerances  Labs:  11/22/17:   Na 141, K 4.5, Glu 107, BUN 12, SCr 0.72  Wt Readings from Last 3 Encounters:  11/12/17 211 lb (95.7 kg)  09/11/17 215 lb (97.5 kg)  05/07/17 211 lb (95.7 kg)   BP Readings from Last 3 Encounters:  12/25/17 (!) 236/102  11/12/17 (!) 198/100  09/11/17 126/84   Pulse Readings from Last 3 Encounters:  12/25/17 76  11/12/17 78  09/11/17 80    Current Outpatient Medications  Medication Sig Dispense Refill  . acetaminophen (TYLENOL) 500 MG tablet Take 500 mg by mouth every 6 (six) hours as needed for moderate pain.    Marland Kitchen atorvastatin (LIPITOR) 80 MG tablet Take 0.5 tablets (40 mg total) by mouth daily at 6 PM. 90 tablet 3  . Blood Glucose Monitoring Suppl (ONE TOUCH ULTRA 2) w/Device KIT Use as directed daily 1 each 0  . clonazePAM (KLONOPIN) 0.5 MG tablet TAKE 1 TABLET BY MOUTH TWICE DAILY AS NEEDED FOR ANXIETY 60 tablet 2  . glimepiride (AMARYL) 2 MG tablet TAKE 1 TABLET (2 MG TOTAL) BY MOUTH DAILY BEFORE BREAKFAST. 30 tablet 5  . metFORMIN (GLUCOPHAGE) 1000 MG tablet 1 TAB BY MOUTH Twice per day 180 tablet 3  . ONE TOUCH ULTRA TEST  test strip Use as instructed Test 1-2 times a day 200 each 11  . ONETOUCH DELICA LANCETS 70J MISC use as directed test 1-2 times a day  0  . pantoprazole (PROTONIX) 40 MG tablet Take 1 tablet (40 mg total) by mouth daily. 90 tablet 3  . telmisartan (MICARDIS) 40 MG tablet Take 1 tablet (40 mg total) by mouth daily. 30 tablet 6  . vitamin C (VITAMIN C) 500 MG tablet Take 1 tablet (500 mg total) by mouth 2 (two) times daily. 60 tablet 0  . carvedilol (COREG) 6.25 MG tablet Take 1 tablet (6.25 mg total) by mouth 2 (two) times daily. 60 tablet 5  . chlorthalidone (HYGROTON) 25 MG tablet Take 0.5 tablets (12.5 mg total) by mouth daily. 30 tablet 6  . citalopram (CELEXA) 10 MG tablet Take 1  tablet (10 mg total) by mouth daily. (Patient not taking: Reported on 12/25/2017) 90 tablet 3   No current facility-administered medications for this visit.     Allergies  Allergen Reactions  . Ibuprofen Other (See Comments)    Bleeding events    Past Medical History:  Diagnosis Date  . ALLERGIC RHINITIS 06/24/2007  . Anemia   . AVM (arteriovenous malformation) of colon   . Blood transfusion without reported diagnosis 05/2015  . COLONIC POLYPS, HX OF 06/24/2007  . Coronary artery disease, non-occlusive 05/2015   Positive troponin in setting of acute anemia.  Cardiac cath showed ostial ramus 60%, ostial RCA 30% and distal LAD 40 and 50%.  . DEPRESSION 10/08/2006  . DIABETES MELLITUS, TYPE II 10/05/2006  . GERD 06/24/2007  . HYPERLIPIDEMIA 10/08/2006  . HYPERTENSION 10/08/2006  . INSOMNIA 09/24/2008  . Left knee DJD   . Moderate to severe aortic stenosis 05/2015   a) Moderate aortic stenosis (mean gradient 22 mmHg, peak 42 mmHg -AVA 1.1-1.2 cm.).;;b) 04/2017: Severe calcified aortic valve with moderate-severe aortic stenosis (peak-mean gradient 60-34 mmHg -moderate), calculated AVA 0.8 cm -severe..  . Myocardial infarction ([place]) 05/2015   Type 2 MI - related to GI Bleed / Anemia (non-obstructive CAD by Cath)  . Osteoporosis 03/10/2016  . PEPTIC ULCER DISEASE 10/08/2006    Blood pressure (!) 236/102, pulse 76, resp. rate 16.  Essential hypertension Patient with poorly controlled hypertension, has been on telmisartan 40 mg for several weeks.  Because of her elevated pressure in the office, she was given clonidine 0.1 mg x 1 before leaving today.  She is to continue with the telmisartan 40 mg and add carvedilol 6.25 mg twice daily and chlorthalidone 12.5 mg once daily.  She was given specific times of day to take her medications, dividing things equally between morning and evening doses.  We also discussed the importance of taking medications at regularly scheduled times.   She is not having any  symptoms related to severe hypertension, so will let her go, but patient aware to go to ED or call 911 should she develop any headaches, blurred vision or other concerning symptoms. I will see her back in the office in 2 weeks for follow up and get a BMET at that time.     Tommy Medal PharmD CPP [facility] [address] [place], [place] [address] [phone]

## 2017-12-25 NOTE — Assessment & Plan Note (Signed)
Patient with poorly controlled hypertension, has been on telmisartan 40 mg for several weeks.  Because of her elevated pressure in the office, she was given clonidine 0.1 mg x 1 before leaving today.  She is to continue with the telmisartan 40 mg and add carvedilol 6.25 mg twice daily and chlorthalidone 12.5 mg once daily.  She was given specific times of day to take her medications, dividing things equally between morning and evening doses.  We also discussed the importance of taking medications at regularly scheduled times.   She is not having any symptoms related to severe hypertension, so will let her go, but patient aware to go to ED or call 911 should she develop any headaches, blurred vision or other concerning symptoms. I will see her back in the office in 2 weeks for follow up and get a BMET at that time.

## 2017-12-27 MED ORDER — CLONIDINE HCL 0.1 MG PO TABS: 0.1000 mg | ORAL_TABLET | Freq: Once | ORAL | Status: DC

## 2018-01-10 ENCOUNTER — Ambulatory Visit: Payer: Medicare Other

## 2018-01-16 ENCOUNTER — Other Ambulatory Visit: Payer: Self-pay

## 2018-01-16 NOTE — Patient Outreach (Signed)
[facility]) Care Management  01/16/2018  Mary Mccarthy 07/01/1940 [id number]   Medication Adherence call to Mary Mccarthy spoke with patient she is due on Atorvastatin 80 mg she explain she has a doctor's appointment in the next couple of days,patient thinks doctor will make a change on all her medication she prefers to wait until she sees the doctor and then she will order it if she is keep on this medication. Mary Mccarthy is showing past due under [facility].   Lillia Abed CPhT Pharmacy Technician [facility] Care Management Direct Dial [phone]  Fax [phone] [email or web address]

## 2018-01-17 ENCOUNTER — Ambulatory Visit (INDEPENDENT_AMBULATORY_CARE_PROVIDER_SITE_OTHER): Payer: Medicare Other | Admitting: Pharmacist

## 2018-01-17 VITALS — BP 148/64 | HR 60 | Ht 61.0 in | Wt 206.4 lb

## 2018-01-17 DIAGNOSIS — I10 Essential (primary) hypertension: Secondary | ICD-10-CM | POA: Diagnosis not present

## 2018-01-17 MED ORDER — CHLORTHALIDONE 25 MG PO TABS: 25.0000 mg | ORAL_TABLET | Freq: Every day | ORAL | 1 refills | Status: DC

## 2018-01-17 NOTE — Progress Notes (Signed)
HPI:  Mary Mccarthy is a 77 y.o. female patient of Dr Ellyn Hack, with a PMH below who presents today for hypertension clinic follow up.  In addition to hypertension, her medical history is significant for chronic diastolic heart failure, hyperlipidemia, pulmonary hypertension, non-occlusive CAD, anemia, DM2, osteoporosis and obesity.  During last HTN visit her BP was 236/102 and clonidine x 1 dose was given in office. Chlorthalidone and carvedilol were also added to her regimen during last OV.   Patient has not had such an elevated reading before, and believes due to some stresses in her life.  Her twin sister passed away 2 weeks ago.   Patient denies problems with the new medication.  No dizziness, swelling, head aches, blurry vision, or increased fatigue noted. Rreport compliance with all medication.   Blood Pressure Goal:  130/80  Current Medications:  Carvedilol 6.'25mg'$  twice daily  Chlorthalidone 12.'5mg'$  daily  telmisartan 40 mg qd - evenings  Family Hx:  Twin sister - Geralene CAD  Hypertension, heart disease in dad (dad died from [place]), 4 brothers, 3 died with heart disease, 1 living with heart disease  Social Hx:  No tobacco, but husband regular indoor smoker; no alcohol; lots of coffee - up to 5 cups per day; soda 2-3 cans per week  Diet:  Admits to junk food addict - loves cookies; sweet foods not salty;  Mostly home cooked , no added salt; canned veggies.    Exercise:  Some yard work, takes care of husband,   Home BP readings:  No home cuff - states she bought and returned 3 different cuffs due to inaccuracies when compared to MD offices  Intolerances:   No cardiac medication intolerances  Labs:  11/22/17:   Na 141, K 4.5, Glu 107, BUN 12, SCr 0.72  Wt Readings from Last 3 Encounters:  01/17/18 206 lb 6.4 oz (93.6 kg)  11/12/17 211 lb (95.7 kg)  09/11/17 215 lb (97.5 kg)   BP Readings from Last 3 Encounters:  01/17/18 (!) 148/64  12/25/17 (!) 236/102  11/12/17  (!) 198/100   Pulse Readings from Last 3 Encounters:  01/17/18 60  12/25/17 76  11/12/17 78    Current Outpatient Medications  Medication Sig Dispense Refill  . atorvastatin (LIPITOR) 80 MG tablet Take 0.5 tablets (40 mg total) by mouth daily at 6 PM. 90 tablet 3  . Blood Glucose Monitoring Suppl (ONE TOUCH ULTRA 2) w/Device KIT Use as directed daily 1 each 0  . carvedilol (COREG) 6.25 MG tablet Take 1 tablet (6.25 mg total) by mouth 2 (two) times daily. 60 tablet 5  . chlorthalidone (HYGROTON) 25 MG tablet Take 1 tablet (25 mg total) by mouth daily. 30 tablet 1  . clonazePAM (KLONOPIN) 0.5 MG tablet TAKE 1 TABLET BY MOUTH TWICE DAILY AS NEEDED FOR ANXIETY 60 tablet 2  . glimepiride (AMARYL) 2 MG tablet TAKE 1 TABLET (2 MG TOTAL) BY MOUTH DAILY BEFORE BREAKFAST. 30 tablet 5  . metFORMIN (GLUCOPHAGE) 1000 MG tablet 1 TAB BY MOUTH Twice per day 180 tablet 3  . ONE TOUCH ULTRA TEST test strip Use as instructed Test 1-2 times a day 200 each 11  . ONETOUCH DELICA LANCETS 85Y MISC use as directed test 1-2 times a day  0  . pantoprazole (PROTONIX) 40 MG tablet Take 1 tablet (40 mg total) by mouth daily. 90 tablet 3  . telmisartan (MICARDIS) 40 MG tablet Take 1 tablet (40 mg total) by mouth daily. 30 tablet  6  . vitamin C (VITAMIN C) 500 MG tablet Take 1 tablet (500 mg total) by mouth 2 (two) times daily. 60 tablet 0  . acetaminophen (TYLENOL) 500 MG tablet Take 500 mg by mouth every 6 (six) hours as needed for moderate pain.     No current facility-administered medications for this visit.     Allergies  Allergen Reactions  . Ibuprofen Other (See Comments)    Bleeding events    Past Medical History:  Diagnosis Date  . ALLERGIC RHINITIS 06/24/2007  . Anemia   . AVM (arteriovenous malformation) of colon   . Blood transfusion without reported diagnosis 05/2015  . COLONIC POLYPS, HX OF 06/24/2007  . Coronary artery disease, non-occlusive 05/2015   Positive troponin in setting of acute  anemia.  Cardiac cath showed ostial ramus 60%, ostial RCA 30% and distal LAD 40 and 50%.  . DEPRESSION 10/08/2006  . DIABETES MELLITUS, TYPE II 10/05/2006  . GERD 06/24/2007  . HYPERLIPIDEMIA 10/08/2006  . HYPERTENSION 10/08/2006  . INSOMNIA 09/24/2008  . Left knee DJD   . Moderate to severe aortic stenosis 05/2015   a) Moderate aortic stenosis (mean gradient 22 mmHg, peak 42 mmHg -AVA 1.1-1.2 cm.).;;b) 04/2017: Severe calcified aortic valve with moderate-severe aortic stenosis (peak-mean gradient 60-34 mmHg -moderate), calculated AVA 0.8 cm -severe..  . Myocardial infarction ([place]) 05/2015   Type 2 MI - related to GI Bleed / Anemia (non-obstructive CAD by Cath)  . Osteoporosis 03/10/2016  . PEPTIC ULCER DISEASE 10/08/2006    Blood pressure (!) 148/64, pulse 60, height '5\' 1"'$  (1.549 m), weight 206 lb 6.4 oz (93.6 kg).  Essential hypertension Blood pressure remains elevated above desired goal of 130/80 but greatly improved since last office visit. Patient denies problems with current therapy and is currently working on positive lifestyle modifications. Will continue current medication without changes, repeat BMET today to assess renal function after initiating chlorthalidone, and follow up in 8 weeks. Plan to increase telmisartan to '80mg'$  during next office visit if additional BP control needed and renal function remains stable.    Delois Tolbert Rodriguez-Guzman PharmD, BCPS, CPP [place] [id number] 01/18/2018 8:00 AM

## 2018-01-17 NOTE — Patient Instructions (Addendum)
Return for a  follow up appointment in 8 week  Go to the lab in TODAY  Check your blood pressure at home daily (if able) and keep record of the readings.  Take your BP meds as follows: *NO MEDICATION CHANGE*  Bring all of your meds, your BP cuff and your record of home blood pressures to your next appointment.  Exercise as you're able, try to walk approximately 30 minutes per day.  Keep salt intake to a minimum, especially watch canned and prepared boxed foods.  Eat more fresh fruits and vegetables and fewer canned items.  Avoid eating in fast food restaurants.    HOW TO TAKE YOUR BLOOD PRESSURE: . Rest 5 minutes before taking your blood pressure. .  Don't smoke or drink caffeinated beverages for at least 30 minutes before. . Take your blood pressure before (not after) you eat. . Sit comfortably with your back supported and both feet on the floor (don't cross your legs). . Elevate your arm to heart level on a table or a desk. . Use the proper sized cuff. It should fit smoothly and snugly around your bare upper arm. There should be enough room to slip a fingertip under the cuff. The bottom edge of the cuff should be 1 inch above the crease of the elbow. . Ideally, take 3 measurements at one sitting and record the average.

## 2018-01-18 ENCOUNTER — Encounter: Payer: Self-pay | Admitting: Pharmacist

## 2018-01-18 LAB — BASIC METABOLIC PANEL
BUN/Creatinine Ratio: 17 (ref 12–28)
BUN: 15 mg/dL (ref 8–27)
CALCIUM: 10.3 mg/dL (ref 8.7–10.3)
CHLORIDE: 102 mmol/L (ref 96–106)
CO2: 26 mmol/L (ref 20–29)
Creatinine, Ser: 0.86 mg/dL (ref 0.57–1.00)
GFR, EST AFRICAN AMERICAN: 74 mL/min/{1.73_m2} (ref >59)
GFR, EST NON AFRICAN AMERICAN: 64 mL/min/{1.73_m2} (ref >59)
Glucose: 135 mg/dL — ABNORMAL HIGH (ref 65–99)
Potassium: 4.8 mmol/L (ref 3.5–5.2)
Sodium: 141 mmol/L (ref 134–144)

## 2018-01-18 NOTE — Assessment & Plan Note (Signed)
Blood pressure remains elevated above desired goal of 130/80 but greatly improved since last office visit. Patient denies problems with current therapy and is currently working on positive lifestyle modifications. Will continue current medication without changes, repeat BMET today to assess renal function after initiating chlorthalidone, and follow up in 8 weeks. Plan to increase telmisartan to 80mg  during next office visit if additional BP control needed and renal function remains stable.

## 2018-01-19 ENCOUNTER — Other Ambulatory Visit: Payer: Self-pay | Admitting: Internal Medicine

## 2018-01-21 NOTE — Telephone Encounter (Signed)
Done erx 

## 2018-01-31 ENCOUNTER — Telehealth: Payer: Self-pay | Admitting: Cardiology

## 2018-01-31 NOTE — Telephone Encounter (Signed)
New Message   *STAT* If patient is at the pharmacy, call can be transferred to refill team.   1. Which medications need to be refilled? (please list name of each medication and dose if known) chlorthalidone Pharmacist increased medication to 1 tablet 1x daily Needs new prescription for increase    2. Which pharmacy/location (including street and city if local pharmacy) is medication to be sent to? [facility] [address]   3. Do they need a 30 day or 90 day supply? 90 Day

## 2018-02-01 MED ORDER — CHLORTHALIDONE 25 MG PO TABS: 25.0000 mg | ORAL_TABLET | Freq: Every day | ORAL | 3 refills | Status: DC

## 2018-02-01 NOTE — Telephone Encounter (Signed)
Refill sent to the pharmacy electronically.  

## 2018-03-14 ENCOUNTER — Ambulatory Visit (INDEPENDENT_AMBULATORY_CARE_PROVIDER_SITE_OTHER): Payer: Medicare Other | Admitting: Internal Medicine

## 2018-03-14 ENCOUNTER — Other Ambulatory Visit (INDEPENDENT_AMBULATORY_CARE_PROVIDER_SITE_OTHER): Payer: Medicare Other

## 2018-03-14 ENCOUNTER — Encounter: Payer: Self-pay | Admitting: Internal Medicine

## 2018-03-14 ENCOUNTER — Ambulatory Visit (INDEPENDENT_AMBULATORY_CARE_PROVIDER_SITE_OTHER)
Admission: RE | Admit: 2018-03-14 | Discharge: 2018-03-14 | Disposition: A | Discharge status: Home / Self Care | Payer: Medicare Other | Source: Ambulatory Visit | Attending: Internal Medicine | Admitting: Internal Medicine

## 2018-03-14 VITALS — BP 144/88 | HR 68 | Temp 98.8°F | Ht 61.0 in | Wt 210.0 lb

## 2018-03-14 DIAGNOSIS — R05 Cough: Secondary | ICD-10-CM

## 2018-03-14 DIAGNOSIS — Z0001 Encounter for general adult medical examination with abnormal findings: Secondary | ICD-10-CM

## 2018-03-14 DIAGNOSIS — R059 Cough, unspecified: Secondary | ICD-10-CM | POA: Insufficient documentation

## 2018-03-14 DIAGNOSIS — E119 Type 2 diabetes mellitus without complications: Secondary | ICD-10-CM

## 2018-03-14 DIAGNOSIS — R062 Wheezing: Secondary | ICD-10-CM | POA: Insufficient documentation

## 2018-03-14 DIAGNOSIS — I10 Essential (primary) hypertension: Secondary | ICD-10-CM

## 2018-03-14 LAB — BASIC METABOLIC PANEL
BUN: 22 mg/dL (ref 6–23)
CO2: 30 mEq/L (ref 19–32)
Calcium: 10.4 mg/dL (ref 8.4–10.5)
Chloride: 103 mEq/L (ref 96–112)
Creatinine, Ser: 0.93 mg/dL (ref 0.40–1.20)
GFR: 58.09 mL/min — AB (ref >60.00)
Glucose, Bld: 113 mg/dL — ABNORMAL HIGH (ref 70–99)
Potassium: 4.2 mEq/L (ref 3.5–5.1)
Sodium: 138 mEq/L (ref 135–145)

## 2018-03-14 LAB — HEPATIC FUNCTION PANEL
ALT: 17 U/L (ref 0–35)
AST: 20 U/L (ref 0–37)
Albumin: 3.9 g/dL (ref 3.5–5.2)
Alkaline Phosphatase: 46 U/L (ref 39–117)
Bilirubin, Direct: 0.3 mg/dL (ref 0.0–0.3)
Total Bilirubin: 1 mg/dL (ref 0.2–1.2)
Total Protein: 6.9 g/dL (ref 6.0–8.3)

## 2018-03-14 LAB — CBC WITH DIFFERENTIAL/PLATELET
BASOS ABS: 0 10*3/uL (ref 0.0–0.1)
Basophils Relative: 0.5 % (ref 0.0–3.0)
Eosinophils Absolute: 0.1 10*3/uL (ref 0.0–0.7)
Eosinophils Relative: 3 % (ref 0.0–5.0)
HCT: 35.9 % — ABNORMAL LOW (ref 36.0–46.0)
HEMOGLOBIN: 12.4 g/dL (ref 12.0–15.0)
Lymphocytes Relative: 31 % (ref 12.0–46.0)
Lymphs Abs: 1.4 10*3/uL (ref 0.7–4.0)
MCHC: 34.4 g/dL (ref 30.0–36.0)
MCV: 94.3 fl (ref 78.0–100.0)
Monocytes Absolute: 0.5 10*3/uL (ref 0.1–1.0)
Monocytes Relative: 10.5 % (ref 3.0–12.0)
Neutro Abs: 2.6 10*3/uL (ref 1.4–7.7)
Neutrophils Relative %: 55 % (ref 43.0–77.0)
Platelets: 165 10*3/uL (ref 150.0–400.0)
RBC: 3.81 Mil/uL — AB (ref 3.87–5.11)
RDW: 14 % (ref 11.5–15.5)
WBC: 4.6 10*3/uL (ref 4.0–10.5)

## 2018-03-14 LAB — LIPID PANEL
Cholesterol: 68 mg/dL (ref 0–200)
HDL: 20.9 mg/dL — AB (ref >39.00)
LDL Cholesterol: 29 mg/dL (ref 0–99)
NonHDL: 46.88
Total CHOL/HDL Ratio: 3
Triglycerides: 91 mg/dL (ref 0.0–149.0)
VLDL: 18.2 mg/dL (ref 0.0–40.0)

## 2018-03-14 LAB — TSH: TSH: 4.21 u[IU]/mL (ref 0.35–4.50)

## 2018-03-14 LAB — HEMOGLOBIN A1C: Hgb A1c MFr Bld: 6.9 % — ABNORMAL HIGH (ref 4.6–6.5)

## 2018-03-14 IMAGING — DX DG CHEST 2V
2 series · 2 of 2 positions shown · non-contrast
Comparison: [DATE]

CLINICAL DATA: Cough and fever for several days

EXAM:
CHEST - 2 VIEW

[chest pa]
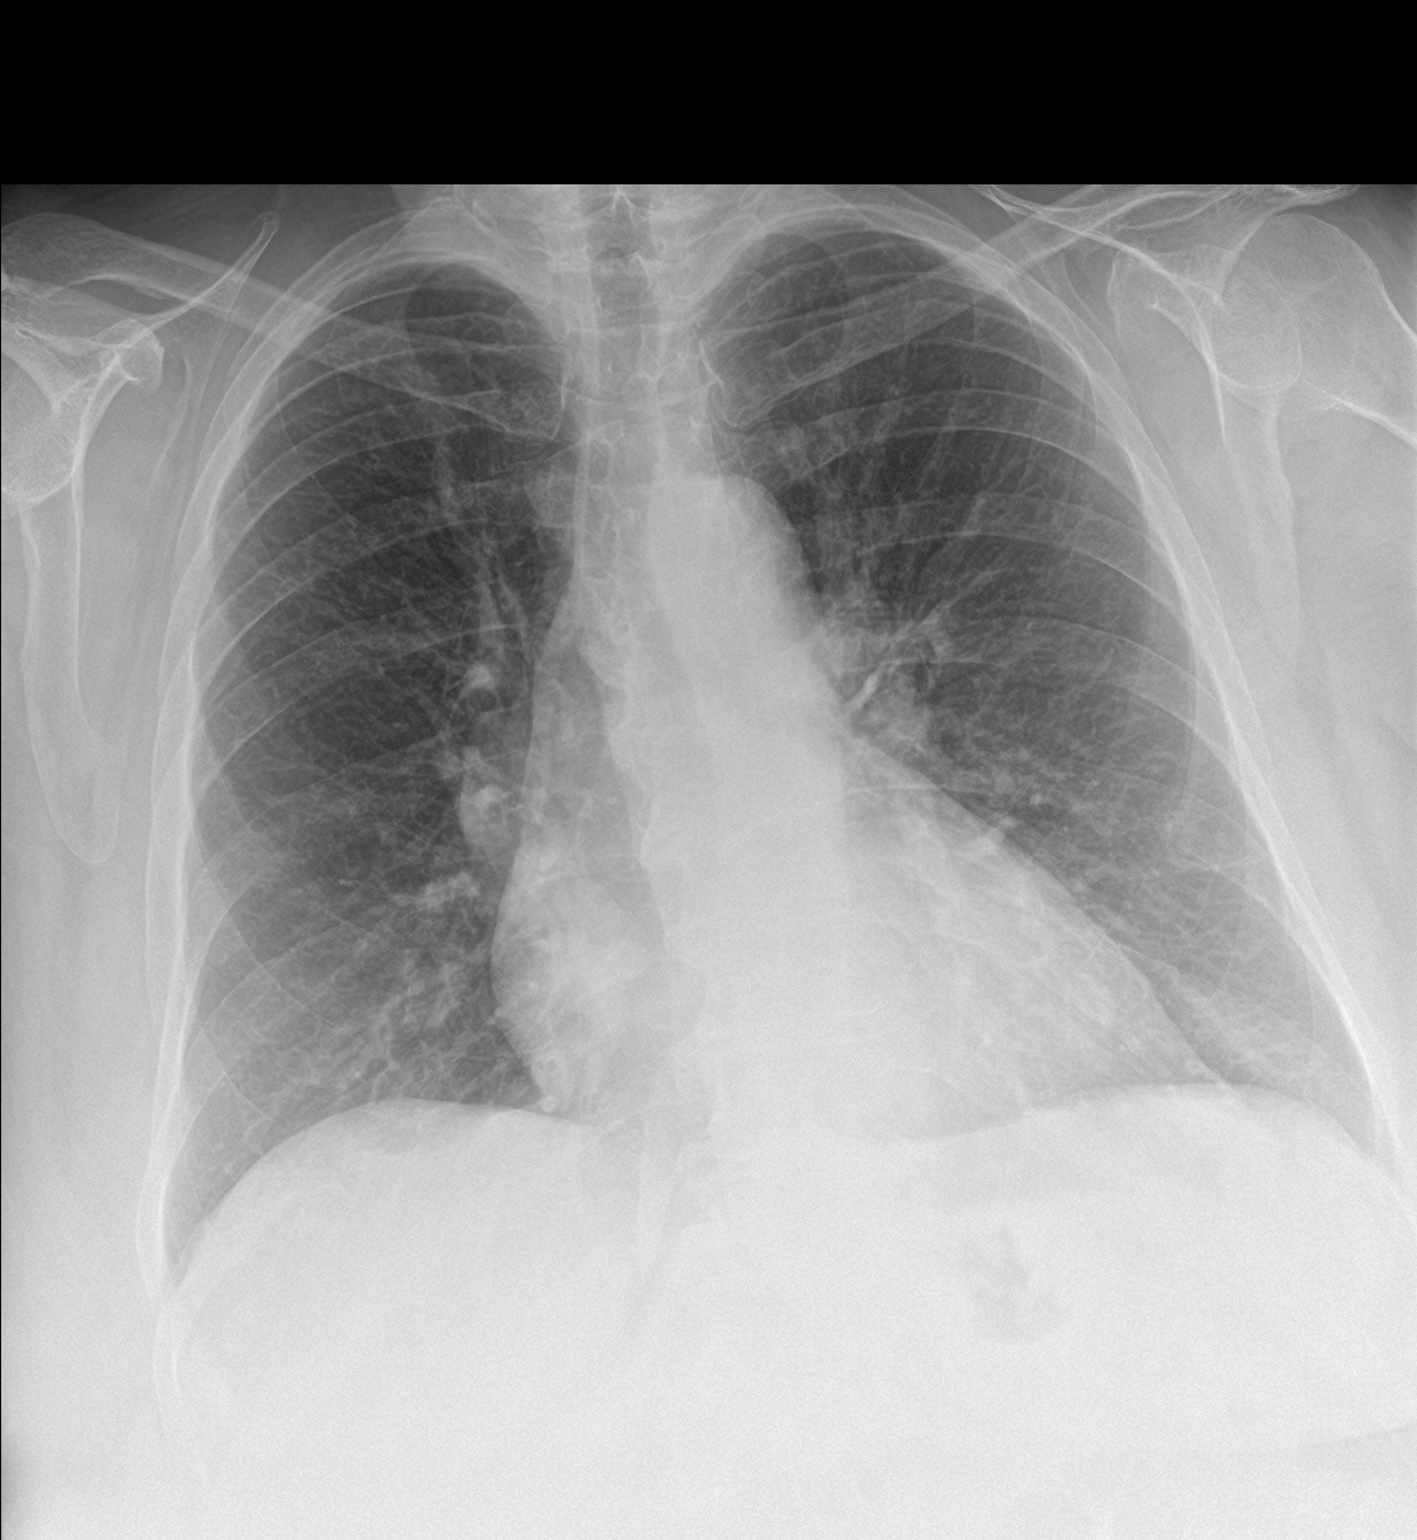

[chest lat]
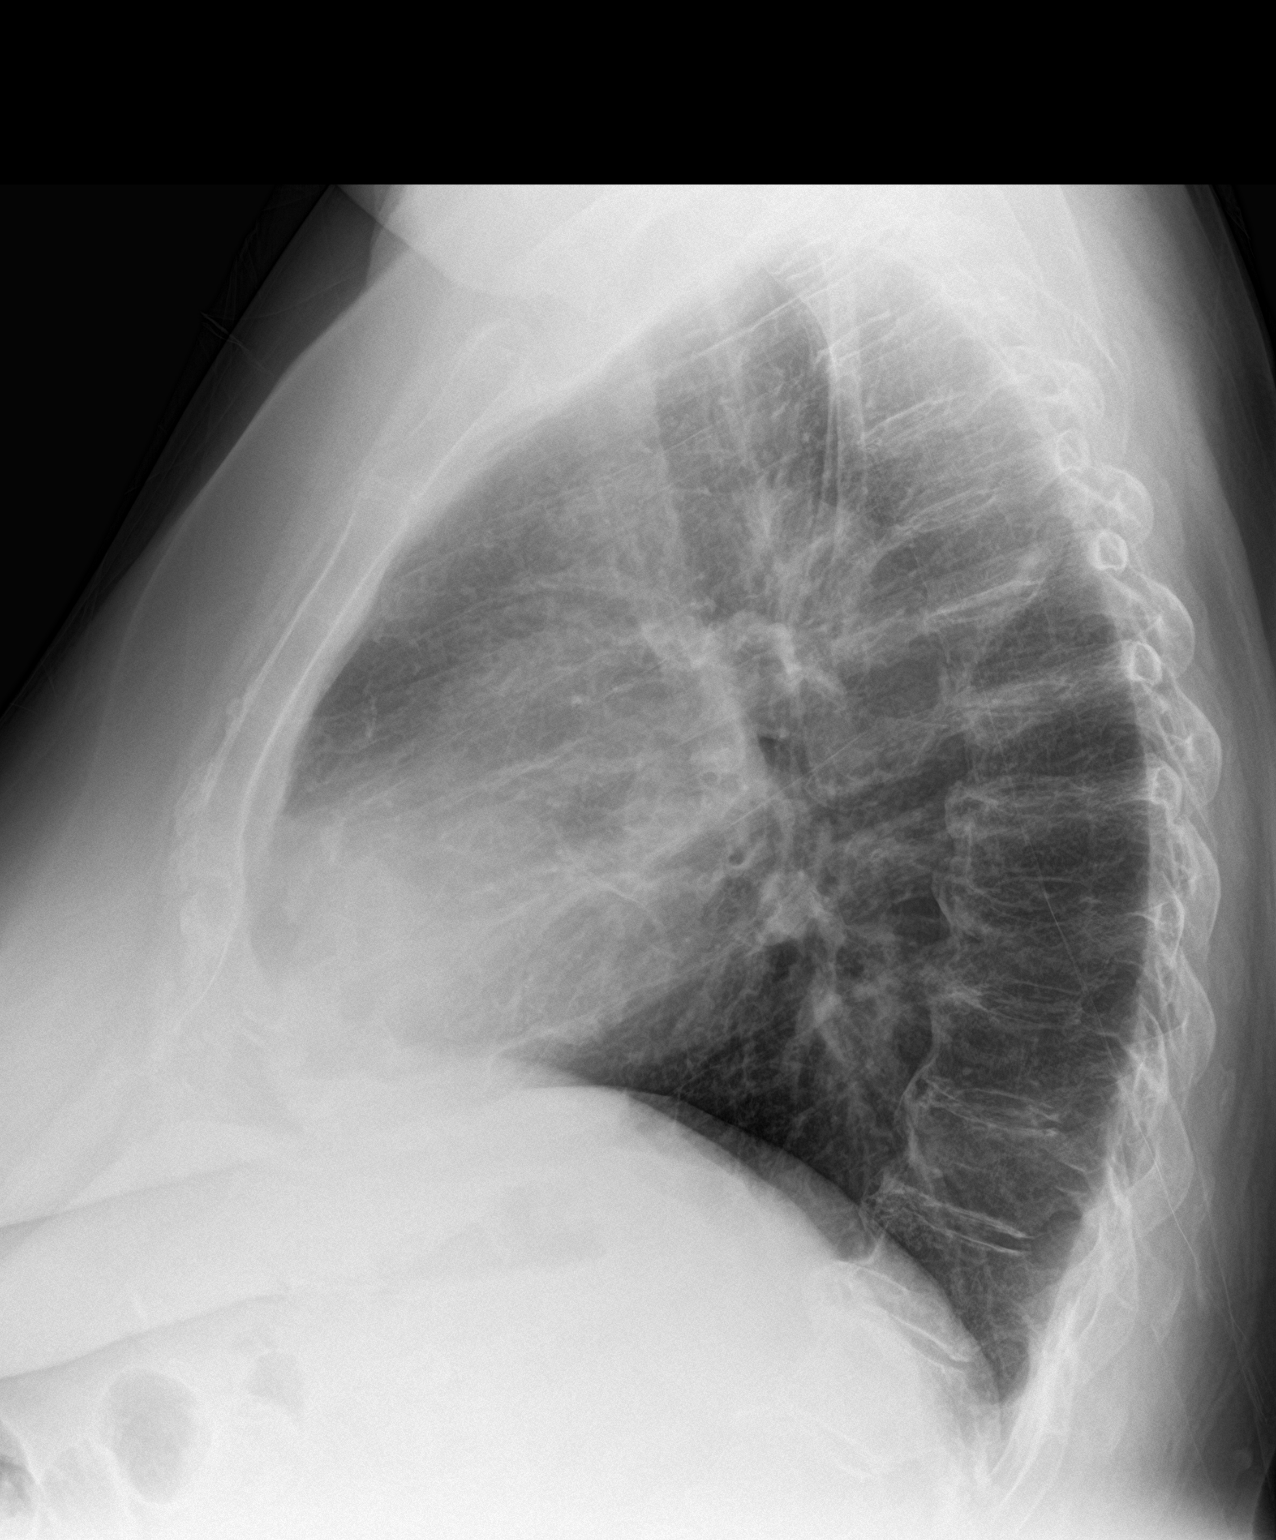

[2 of 2 positions shown; findings below may reference images not displayed]

FINDINGS: Cardiac shadow is enlarged but stable. Lungs are well aerated
bilaterally. No focal infiltrate or sizable effusion is seen.
Degenerative change of the thoracic spine is noted.
IMPRESSION: No acute abnormality noted.

## 2018-03-14 MED ORDER — ONETOUCH DELICA LANCETS 33G MISC: 11 refills | Status: DC

## 2018-03-14 MED ORDER — HYDROCODONE-HOMATROPINE 5-1.5 MG/5ML PO SYRP: 5.0000 mL | ORAL_SOLUTION | Freq: Four times a day (QID) | ORAL | 0 refills | Status: AC | PRN

## 2018-03-14 MED ORDER — GLUCOSE BLOOD VI STRP: ORAL_STRIP | 11 refills | Status: DC

## 2018-03-14 MED ORDER — AZITHROMYCIN 250 MG PO TABS: ORAL_TABLET | ORAL | 1 refills | Status: DC

## 2018-03-14 MED ORDER — PREDNISONE 10 MG PO TABS: ORAL_TABLET | ORAL | 0 refills | Status: DC

## 2018-03-14 NOTE — Assessment & Plan Note (Signed)
stable overall by history and exam, recent data reviewed with pt, and pt to continue medical treatment as before,  to f/u any worsening symptoms or concerns  

## 2018-03-14 NOTE — Progress Notes (Signed)
Subjective:    Patient ID: Mary Mccarthy, female    DOB: 1940/10/24, 78 y.o.   MRN: [id number]  HPI   Here for wellness and f/u;  Overall doing ok; Marland Kitchen  Pt denies neurological change such as new headache, facial or extremity weakness.  Pt denies polydipsia, polyuria, or low sugar symptoms. Pt states overall good compliance with treatment and medications, good tolerability, and has been trying to follow appropriate diet.  Pt denies worsening depressive symptoms, suicidal ideation or panic. No fever, night sweats, wt loss, loss of appetite, or other constitutional symptoms.  Pt states good ability with ADL's, has low fall risk, home safety reviewed and adequate, no other significant changes in hearing or vision, and only occasionally active with exercise. Twin sister died 12/01/19CKD.  Has eye doctor, but no money for copay.   Incidentally, Here with acute onset mild to mod 2-3 days ST, HA, general weakness and malaise, with prod cough greenish sputum, but Pt denies chest pain, increased sob or doe, wheezing, orthopnea, PND, increased LE swelling, palpitations, dizziness or syncope except for onset mild wheezing, sob/doe since last PM Past Medical History:  Diagnosis Date  . ALLERGIC RHINITIS 06/24/2007  . Anemia   . AVM (arteriovenous malformation) of colon   . Blood transfusion without reported diagnosis 05/2015  . COLONIC POLYPS, HX OF 06/24/2007  . Coronary artery disease, non-occlusive 05/2015   Positive troponin in setting of acute anemia.  Cardiac cath showed ostial ramus 60%, ostial RCA 30% and distal LAD 40 and 50%.  . DEPRESSION 10/08/2006  . DIABETES MELLITUS, TYPE II 10/05/2006  . GERD 06/24/2007  . HYPERLIPIDEMIA 10/08/2006  . HYPERTENSION 10/08/2006  . INSOMNIA 09/24/2008  . Left knee DJD   . Moderate to severe aortic stenosis 05/2015   a) Moderate aortic stenosis (mean gradient 22 mmHg, peak 42 mmHg -AVA 1.1-1.2 cm.).;;b) 04/2017: Severe calcified aortic valve with moderate-severe aortic  stenosis (peak-mean gradient 60-34 mmHg -moderate), calculated AVA 0.8 cm -severe..  . Myocardial infarction ([place]) 05/2015   Type 2 MI - related to GI Bleed / Anemia (non-obstructive CAD by Cath)  . Osteoporosis 03/10/2016  . PEPTIC ULCER DISEASE 10/08/2006   Past Surgical History:  Procedure Laterality Date  . APPENDECTOMY    . BREAST BIOPSY    . CARDIAC CATHETERIZATION N/A 05/21/2015   Procedure: Left Heart Cath and Coronary Angiography;  Surgeon: Leonie Man, MD;  Location: [facility];  Service: Cardiovascular;: Ost RI 60%, Ost RCA 30%, dLAD tapers to small vessel w/ 40-50%. Mildly elevated LVEDP. Normal LV Fxn.  . COLONOSCOPY N/A 05/20/2015   Procedure: COLONOSCOPY;  Surgeon: Manus Gunning, MD;  Location: [facility];  Service: Gastroenterology;  Laterality: N/A;  . ESOPHAGOGASTRODUODENOSCOPY N/A 05/20/2015   Procedure: ESOPHAGOGASTRODUODENOSCOPY (EGD);  Surgeon: Manus Gunning, MD;  Location: [facility];  Service: Gastroenterology;  Laterality: N/A;  . TONSILLECTOMY    . TRANSTHORACIC ECHOCARDIOGRAM  10/'15; 4/'17   a) mild AS, mildly elevated PA pressures.;; b)  Moderate LVH.  EF 55-60%.  Moderate aortic stenosis (mean gradient 22 mmHg, peak 42 mmHg -AVA 1.1-1.2 cm.).  Severe LA dilation.  Moderate TR with RVSP peak 51 mmHg and dilated IVC  . TRANSTHORACIC ECHOCARDIOGRAM  04/12/2017   EF 60-65%. No RWMA.  GR 1 DD.  Moderate concentric LVH.  Mild LA dilation. Severe calcified aortic valve with moderate-severe aortic stenosis (peak/mean gradients 60/34 mmHg) and in severe range by AVA (0.8 cm2).  Mild to moderately increased  PA pressures 41 mmHg with normal RV size and function..    . TUBAL LIGATION      reports that she has never smoked. She has never used smokeless tobacco. She reports that she does not drink alcohol or use drugs. family history includes Alcohol abuse in an other family member; Arrhythmia in her sister; Arthritis in an other family member; CAD  in her sister; Cancer in her mother; Depression in an other family member; Diabetes in an other family member; Heart attack (age of onset: 3) in her sister; Heart disease in an other family member; Hyperlipidemia in an other family member; Hypertension in an other family member; Stroke in an other family member. Allergies  Allergen Reactions  . Ibuprofen Other (See Comments)    Bleeding events   Current Outpatient Medications on File Prior to Visit  Medication Sig Dispense Refill  . acetaminophen (TYLENOL) 500 MG tablet Take 500 mg by mouth every 6 (six) hours as needed for moderate pain.    Marland Kitchen atorvastatin (LIPITOR) 80 MG tablet Take 0.5 tablets (40 mg total) by mouth daily at 6 PM. 90 tablet 3  . Blood Glucose Monitoring Suppl (ONE TOUCH ULTRA 2) w/Device KIT Use as directed daily 1 each 0  . carvedilol (COREG) 6.25 MG tablet Take 1 tablet (6.25 mg total) by mouth 2 (two) times daily. 60 tablet 5  . chlorthalidone (HYGROTON) 25 MG tablet Take 1 tablet (25 mg total) by mouth daily. 90 tablet 3  . clonazePAM (KLONOPIN) 0.5 MG tablet TAKE 1 TABLET BY MOUTH TWICE A DAY AS NEEDED FOR ANXIETY 60 tablet 2  . glimepiride (AMARYL) 2 MG tablet TAKE 1 TABLET (2 MG TOTAL) BY MOUTH DAILY BEFORE BREAKFAST. 30 tablet 5  . metFORMIN (GLUCOPHAGE) 1000 MG tablet 1 TAB BY MOUTH Twice per day 180 tablet 3  . pantoprazole (PROTONIX) 40 MG tablet Take 1 tablet (40 mg total) by mouth daily. 90 tablet 3  . telmisartan (MICARDIS) 40 MG tablet Take 1 tablet (40 mg total) by mouth daily. 30 tablet 6  . vitamin C (VITAMIN C) 500 MG tablet Take 1 tablet (500 mg total) by mouth 2 (two) times daily. 60 tablet 0   No current facility-administered medications on file prior to visit.    Review of Systems Constitutional: Negative for other unusual diaphoresis, sweats, appetite or weight changes HENT: Negative for other worsening hearing loss, ear pain, facial swelling, mouth sores or neck stiffness.   Eyes: Negative for  other worsening pain, redness or other visual disturbance.  Respiratory: Negative for other stridor or swelling Cardiovascular: Negative for other palpitations or other chest pain  Gastrointestinal: Negative for worsening diarrhea or loose stools, blood in stool, distention or other pain Genitourinary: Negative for hematuria, flank pain or other change in urine volume.  Musculoskeletal: Negative for myalgias or other joint swelling.  Skin: Negative for other color change, or other wound or worsening drainage.  Neurological: Negative for other syncope or numbness. Hematological: Negative for other adenopathy or swelling Psychiatric/Behavioral: Negative for hallucinations, other worsening agitation, SI, self-injury, or new decreased concentration All other system neg per pt    Objective:   Physical Exam BP (!) 144/88   Pulse 68   Temp 98.8 F (37.1 C) (Oral)   Ht '5\' 1"'$  (1.549 m)   Wt 210 lb (95.3 kg)   SpO2 94%   BMI 39.68 kg/m  VS noted, mild ill Constitutional: Pt is oriented to person, place, and time. Appears well-developed and well-nourished, in  no significant distress and comfortable Head: Normocephalic and atraumatic  Eyes: Conjunctivae and EOM are normal. Pupils are equal, round, and reactive to light Right Ear: External ear normal without discharge Left Ear: External ear normal without discharge Nose: Nose without discharge or deformity Bilat tm's with mild erythema.  Max sinus areas non tender.  Pharynx with mild erythema, no exudate Mouth/Throat: Oropharynx is without other ulcerations and moist  Neck: Normal range of motion. Neck supple. No JVD present. No tracheal deviation present or significant neck LA or mass Cardiovascular: Normal rate, regular rhythm, normal heart sounds and intact distal pulses.   Pulmonary/Chest: WOB normal and breath sounds decreased without rales but with few bilat wheezing  Abdominal: Soft. Bowel sounds are normal. NT. No HSM  Musculoskeletal:  Normal range of motion. Exhibits no edema Lymphadenopathy: Has no other cervical adenopathy.  Neurological: Pt is alert and oriented to person, place, and time. Pt has normal reflexes. No cranial nerve deficit. Motor grossly intact, Gait intact Skin: Skin is warm and dry. No rash noted or new ulcerations Psychiatric:  Has normal mood and affect. Behavior is normal without agitation No other exam findings Lab Results  Component Value Date   WBC 4.6 03/14/2018   HGB 12.4 03/14/2018   HCT 35.9 (L) 03/14/2018   PLT 165.0 03/14/2018   GLUCOSE 113 (H) 03/14/2018   CHOL 68 03/14/2018   TRIG 91.0 03/14/2018   HDL 20.90 (L) 03/14/2018   LDLCALC 29 03/14/2018   ALT 17 03/14/2018   AST 20 03/14/2018   NA 138 03/14/2018   K 4.2 03/14/2018   CL 103 03/14/2018   CREATININE 0.93 03/14/2018   BUN 22 03/14/2018   CO2 30 03/14/2018   TSH 4.21 03/14/2018   INR 1.18 05/21/2015   HGBA1C 6.9 (H) 03/14/2018   MICROALBUR 1.7 03/14/2017       Assessment & Plan:

## 2018-03-14 NOTE — Assessment & Plan Note (Addendum)
Mild to mod, c/w bronchitis vs pna, for cxr, for antibx course, cough med prn, to f/u any worsening symptoms or concerns  In addition to the time spent performing CPE, I spent an additional 25 minutes face to face,in which greater than 50% of this time was spent in counseling and coordination of care for patient's acute illness as documented, including the differential dx, treatment, further evaluation and other management of cough, wheezing, DM, HTN

## 2018-03-14 NOTE — Assessment & Plan Note (Signed)
Mild to mod, for predpac asd  to f/u any worsening symptoms or concerns 

## 2018-03-14 NOTE — Assessment & Plan Note (Signed)

## 2018-03-14 NOTE — Patient Instructions (Signed)
Please take all new medication as prescribed - the antibiotic, cough medicine, and prednisone  Please call if you change your mind about the rescue inhaler  Please continue all other medications as before, and refills have been done if requested.  Please have the pharmacy call with any other refills you may need.  Please continue your efforts at being more active, low cholesterol diet, and weight control.  You are otherwise up to date with prevention measures today.  Please keep your appointments with your specialists as you may have planned  Please go to the XRAY Department in the Basement (go straight as you get off the elevator) for the x-ray testing  Please go to the LAB in the Basement (turn left off the elevator) for the tests to be done today  You will be contacted by phone if any changes need to be made immediately.  Otherwise, you will receive a letter about your results with an explanation, but please check with MyChart first.  Please remember to sign up for MyChart if you have not done so, as this will be important to you in the future with finding out test results, communicating by private email, and scheduling acute appointments online when needed.  Please return in 6 months, or sooner if needed, with Lab testing done 3-5 days before

## 2018-03-15 ENCOUNTER — Other Ambulatory Visit: Payer: Self-pay | Admitting: Internal Medicine

## 2018-03-18 ENCOUNTER — Other Ambulatory Visit: Payer: Self-pay | Admitting: Internal Medicine

## 2018-03-21 ENCOUNTER — Telehealth: Payer: Self-pay | Admitting: Pharmacist

## 2018-03-21 NOTE — Telephone Encounter (Signed)
Blood pressure better but somehow elevated with recent cough and congestion.   Will f/u with HTn in 4 weeks for further medication titration

## 2018-04-15 ENCOUNTER — Other Ambulatory Visit: Payer: Self-pay | Admitting: Internal Medicine

## 2018-04-15 MED ORDER — BLOOD GLUCOSE METER KIT: PACK | 0 refills | Status: DC

## 2018-04-15 NOTE — Telephone Encounter (Signed)
Will route to office for final disposition; pt seen by Dr [name], [facility],  03/14/2018.

## 2018-04-15 NOTE — Telephone Encounter (Signed)
Done hardcopy to shirron 

## 2018-04-15 NOTE — Telephone Encounter (Signed)
Copied from [place] [id number]. Topic: Quick Communication - See Telephone Encounter >> Apr 15, 2018  9:30 AM Ivar Drape wrote: CRM for notification. See Telephone encounter for: 04/15/18. Patient needs a new Blood Glucose Monitoring Suppl (ONE TOUCH ULTRA 2) w/Device KIT because hers broke.

## 2018-04-16 NOTE — Telephone Encounter (Signed)
Faxed

## 2018-04-18 ENCOUNTER — Ambulatory Visit: Payer: Medicare Other

## 2018-04-30 ENCOUNTER — Other Ambulatory Visit: Payer: Self-pay | Admitting: Internal Medicine

## 2018-04-30 NOTE — Telephone Encounter (Signed)
Done erx 

## 2018-05-17 ENCOUNTER — Other Ambulatory Visit: Payer: Self-pay | Admitting: Cardiology

## 2018-05-17 NOTE — Telephone Encounter (Signed)
Carvedilol refilled.

## 2018-06-07 ENCOUNTER — Telehealth: Payer: Self-pay | Admitting: Cardiology

## 2018-06-07 NOTE — Telephone Encounter (Signed)
LVM to schedule 6 month appt.

## 2018-06-11 ENCOUNTER — Other Ambulatory Visit: Payer: Self-pay

## 2018-06-11 NOTE — Patient Outreach (Signed)
[facility]) Care Management  06/11/2018  Mary Mccarthy [birth year] [id number]   Medication Adherence call to Mrs. Mary Mccarthy [employer] spoke with patient she is due on Telmisartan 40 mg patient explain that in January/2020 she received a 90 days supply from [facility] and the next month February/2020 she received other 90 days supply she said she has plenty until August/2020 patient is taking 1 tablet daily. [name] is showing past due under [facility] Ins.   Lillia Abed CPhT Pharmacy Technician [facility] Management Direct Dial [phone]  Fax [phone] [email or web address]

## 2018-07-03 ENCOUNTER — Other Ambulatory Visit: Payer: Self-pay | Admitting: Cardiology

## 2018-07-16 ENCOUNTER — Other Ambulatory Visit: Payer: Self-pay | Admitting: Internal Medicine

## 2018-07-16 DIAGNOSIS — E119 Type 2 diabetes mellitus without complications: Secondary | ICD-10-CM | POA: Diagnosis not present

## 2018-07-16 DIAGNOSIS — H52203 Unspecified astigmatism, bilateral: Secondary | ICD-10-CM | POA: Diagnosis not present

## 2018-07-16 DIAGNOSIS — H25013 Cortical age-related cataract, bilateral: Secondary | ICD-10-CM | POA: Diagnosis not present

## 2018-07-16 DIAGNOSIS — H5203 Hypermetropia, bilateral: Secondary | ICD-10-CM | POA: Diagnosis not present

## 2018-07-16 LAB — HM DIABETES EYE EXAM

## 2018-07-25 DIAGNOSIS — H2513 Age-related nuclear cataract, bilateral: Secondary | ICD-10-CM | POA: Diagnosis not present

## 2018-07-25 DIAGNOSIS — H25013 Cortical age-related cataract, bilateral: Secondary | ICD-10-CM | POA: Diagnosis not present

## 2018-07-25 DIAGNOSIS — E119 Type 2 diabetes mellitus without complications: Secondary | ICD-10-CM | POA: Diagnosis not present

## 2018-07-30 ENCOUNTER — Telehealth: Payer: Self-pay

## 2018-07-30 NOTE — Telephone Encounter (Signed)
Virtual Visit Pre-Appointment Phone Call  Mary Mccarthy, I am calling you today to discuss your upcoming appointment. We are currently trying to limit exposure to the virus that causes COVID-19 by seeing patients at home rather than in the office."  1. "What is the BEST phone number to call the day of the visit?" - include this in appointment notes  2. "Do you have or have access to (through a family member/friend) a smartphone with video capability that we can use for your visit?" a. If yes - list this number in appt notes as "cell" (if different from BEST phone #) and list the appointment type as a VIDEO visit in appointment notes b. If no - list the appointment type as a PHONE visit in appointment notes  3. Confirm consent - "In the setting of the current Covid19 crisis, you are scheduled for a (phone or video) visit with your provider on (date) at (time).  Just as we do with many in-office visits, in order for you to participate in this visit, we must obtain consent.  If you'd like, I can send this to your mychart (if signed up) or email for you to review.  Otherwise, I can obtain your verbal consent now.  All virtual visits are billed to your insurance company just like a normal visit would be.  By agreeing to a virtual visit, we'd like you to understand that the technology does not allow for your provider to perform an examination, and thus may limit your provider's ability to fully assess your condition. If your provider identifies any concerns that need to be evaluated in person, we will make arrangements to do so.  Finally, though the technology is pretty good, we cannot assure that it will always work on either your or our end, and in the setting of a video visit, we may have to convert it to a phone-only visit.  In either situation, we cannot ensure that we have a secure connection.  Are you willing to proceed?" Patient provided verbal consent. 4. Advise patient to be prepared - "Two hours  prior to your appointment, go ahead and check your blood pressure, pulse, oxygen saturation, and your weight (if you have the equipment to check those) and write them all down. When your visit starts, your provider will ask you for this information. If you have an Apple Watch or Kardia device, please plan to have heart rate information ready on the day of your appointment. Please have a pen and paper handy nearby the day of the visit as well."  5. Give patient instructions for MyChart download to smartphone OR [email or web address] as below if video visit (depending on what platform provider is using)  6. Inform patient they will receive a phone call 15 minutes prior to their appointment time (may be from unknown caller ID) so they should be prepared to answer    TELEPHONE CALL NOTE  [name] has been deemed a candidate for a follow-up tele-health visit to limit community exposure during the Covid-19 pandemic. I spoke with the patient via phone to ensure availability of phone/video source, confirm preferred email & phone number, and discuss instructions and expectations.  I reminded [name] to be prepared with any vital sign and/or heart rhythm information that could potentially be obtained via home monitoring, at the time of her visit. I reminded [name] to expect a phone call prior to her visit.  Garey Ham, CMA 07/30/2018 2:28 PM   INSTRUCTIONS  FOR DOWNLOADING THE MYCHART APP TO SMARTPHONE  - The patient must first make sure to have activated MyChart and know their login information - If Apple, go to [employer] and type in MyChart in the search bar and download the app. If Android, ask patient to go to [employer] and type in [place] in the search bar and download the app. The app is free but as with any other app downloads, their phone may require them to verify saved payment information or Apple/Android password.  - The patient will need to then log into the app  with their MyChart username and password, and select [facility] as their healthcare provider to link the account. When it is time for your visit, go to the MyChart app, find appointments, and click Begin Video Visit. Be sure to Select Allow for your device to access the Microphone and Camera for your visit. You will then be connected, and your provider will be with you shortly.  **If they have any issues connecting, or need assistance please contact MyChart service desk [phone])**  **If using a computer, in order to ensure the best quality for their visit they will need to use either of the following Internet Browsers: [employer], or Google Chrome**  IF USING DOXIMITY or DOXY.ME - The patient will receive a link just prior to their visit by text.     FULL LENGTH CONSENT FOR TELE-HEALTH VISIT   I hereby voluntarily request, consent and authorize [facility] and its employed or contracted physicians, physician assistants, nurse practitioners or other licensed health care professionals (the Practitioner), to provide me with telemedicine health care services (the "Services") as deemed necessary by the treating Practitioner. I acknowledge and consent to receive the Services by the Practitioner via telemedicine. I understand that the telemedicine visit will involve communicating with the Practitioner through live audiovisual communication technology and the disclosure of certain medical information by electronic transmission. I acknowledge that I have been given the opportunity to request an in-person assessment or other available alternative prior to the telemedicine visit and am voluntarily participating in the telemedicine visit.  I understand that I have the right to withhold or withdraw my consent to the use of telemedicine in the course of my care at any time, without affecting my right to future care or treatment, and that the Practitioner or I may terminate the  telemedicine visit at any time. I understand that I have the right to inspect all information obtained and/or recorded in the course of the telemedicine visit and may receive copies of available information for a reasonable fee.  I understand that some of the potential risks of receiving the Services via telemedicine include:  Marland Kitchen Delay or interruption in medical evaluation due to technological equipment failure or disruption; . Information transmitted may not be sufficient (e.g. poor resolution of images) to allow for appropriate medical decision making by the Practitioner; and/or  . In rare instances, security protocols could fail, causing a breach of personal health information.  Furthermore, I acknowledge that it is my responsibility to provide information about my medical history, conditions and care that is complete and accurate to the best of my ability. I acknowledge that Practitioner's advice, recommendations, and/or decision may be based on factors not within their control, such as incomplete or inaccurate data provided by me or distortions of diagnostic images or specimens that may result from electronic transmissions. I understand that the practice of medicine is not an exact science and  that Practitioner makes no warranties or guarantees regarding treatment outcomes. I acknowledge that I will receive a copy of this consent concurrently upon execution via email to the email address I last provided but may also request a printed copy by calling the office of [facility].    I understand that my insurance will be billed for this visit.   I have read or had this consent read to me. . I understand the contents of this consent, which adequately explains the benefits and risks of the Services being provided via telemedicine.  . I have been provided ample opportunity to ask questions regarding this consent and the Services and have had my questions answered to my satisfaction. . I give my informed  consent for the services to be provided through the use of telemedicine in my medical care  By participating in this telemedicine visit I agree to the above.

## 2018-07-30 NOTE — Telephone Encounter (Signed)
Called pt to change appointment to virtual , spoke with son and left message for Ms. Blas to give [place] a call back.

## 2018-07-31 ENCOUNTER — Telehealth: Payer: Self-pay

## 2018-07-31 NOTE — Telephone Encounter (Signed)
Called Pt to change appointment into a office visit. Communicated with the Pt that face covering is required before entering lobby, also communicated with pt that no one is allowed in the obby area or exam room besides the patient.

## 2018-08-02 ENCOUNTER — Telehealth: Payer: Self-pay | Admitting: Cardiology

## 2018-08-02 NOTE — Telephone Encounter (Signed)

## 2018-08-05 ENCOUNTER — Ambulatory Visit (INDEPENDENT_AMBULATORY_CARE_PROVIDER_SITE_OTHER): Payer: Medicare Other | Admitting: Cardiology

## 2018-08-05 ENCOUNTER — Encounter: Payer: Self-pay | Admitting: Cardiology

## 2018-08-05 ENCOUNTER — Other Ambulatory Visit: Payer: Self-pay

## 2018-08-05 VITALS — BP 188/68 | HR 67 | Ht 61.0 in | Wt 213.6 lb

## 2018-08-05 DIAGNOSIS — I2510 Atherosclerotic heart disease of native coronary artery without angina pectoris: Secondary | ICD-10-CM

## 2018-08-05 DIAGNOSIS — E785 Hyperlipidemia, unspecified: Secondary | ICD-10-CM | POA: Diagnosis not present

## 2018-08-05 DIAGNOSIS — I10 Essential (primary) hypertension: Secondary | ICD-10-CM | POA: Diagnosis not present

## 2018-08-05 DIAGNOSIS — I350 Nonrheumatic aortic (valve) stenosis: Secondary | ICD-10-CM

## 2018-08-05 DIAGNOSIS — I272 Pulmonary hypertension, unspecified: Secondary | ICD-10-CM | POA: Diagnosis not present

## 2018-08-05 DIAGNOSIS — I5032 Chronic diastolic (congestive) heart failure: Secondary | ICD-10-CM

## 2018-08-05 NOTE — Patient Instructions (Addendum)
Medication Instructions:  NO CHANGES  If you need a refill on your cardiac medications before your next appointment, please call your pharmacy.   Lab work: NOT NEEDED   Testing/Procedures: WILL BE SCHEDULE [place] 2020 Your physician has requested that you have an echocardiogram. Echocardiography is a painless test that uses sound waves to create images of your heart. It provides your doctor with information about the size and shape of your heart and how well your heart's chambers and valves are working. This procedure takes approximately one hour. There are no restrictions for this procedure.    Follow-Up: At [facility], you and your health needs are our priority.  As part of our continuing mission to provide you with exceptional heart care, we have created designated Provider Care Teams.  These Care Teams include your primary Cardiologist (physician) and Advanced Practice Providers (APPs -  Physician Assistants and Nurse Practitioners) who all work together to provide you with the care you need, when you need it. . You will need a follow up appointment in 7 T0 8 months JAN/FEB 2021.  Please call our office 2 months in advance to schedule this appointment.  You may see Glenetta Hew, MD or one of the following Advanced Practice Providers on your designated Care Team:   . Rosaria Ferries, PA-C . Jory Sims, DNP, ANP  Any Other Special Instructions Will Be Listed Below (If Applicable).

## 2018-08-05 NOTE — Progress Notes (Signed)
PCP: Biagio Borg, MD  Clinic Note: Chief Complaint  Patient presents with   Follow-up   Cardiac Valve Problem    Mod-Severe AS;     HPI: Mary Mccarthy is a 78 y.o. female who is being seen today for follow-up evaluation of moderate to severe aortic stenosis and hypertension (with prior history of SVT).  She was initially seen at the request of Biagio Borg, MD - in Feb 2019.  Mary Mccarthy has a h/o SVT & Mod AS with mild Non-obstructive CAD by Cath (following Type 2 MI)  April 2017 - rapid heartbeat/SVT with diffuse "ischemic" changes (Seen by Drs. Ovid and [place]).  --> ++ Troponin ~6.5 with, Severe Anemia - 4 units PRBC. Following GI procedure to cauterize lesions, CATH: (performed by me) => mild to moderate nonobstructive CAD --> optimize medical management.  She was also noted to have moderate aortic stenosis on echocardiogram.  Her family history is notable for the fact that her sister Mary Mccarthy) is a well-known patient to our practice who sees Dr. Sallyanne Kuster -.  She had significant CAD and arrhythmia issues - died 01-12-2018.  Mary Mccarthy was seen in October 2019 -was stable from a cardiac standpoint.  Exertional dyspnea if she overdoes it -can occur with going up and down stairs.   --Started Micardis with plans to increase to 40 mg daily.   Recent Hospitalizations:  None  Studies Personally Reviewed - (if available, images/films reviewed: From Epic Chart or Care Everywhere).    Echo 11/06/17: EF 60-65%. No RWMA.  Mod Conc LVH. Gr 1 DD.   Moderate-Severe AS (Mean gradient 36 mmHg).  Mod LA dilation. Mild-Mod PA HTN.  Interval History: Mary Mccarthy presents here today for 6 month follow-up.  Most notable issue is that because of the COVID-19 restrictions, she is not able to do anything.  She has not really been out about doing much in the way of any activity.  She also is reluctant to leave her husband, for whom she has primary caregiver related to his dementia  and other elements.  She cannot leave him alone for too long period time and therefore is not able to go out and do a decent walk.  She tries to do as much walking around the house that she can.  As such, she is quite short of breath if she overdoes things.  She has some mild end of day swelling but nothing significant.  She denies any chest pain or pressure with rest or exertion.  No orthopnea or any notable edema (mild headache).  No syncope/near syncope or TIA shows amaurosis fugax. No myalgias. No rapid irregular heartbeats, or palpitations.     The patient does not have symptoms concerning for COVID-19 infection (fever, chills, cough, or new shortness of breath).  The patient is practicing social distancing.   COVID-19 Education: The signs and symptoms of COVID-19 were discussed with the patient and how to seek care for testing (follow up with PCP or arrange E-visit).   The importance of social distancing was discussed today.   ROS: A comprehensive was performed. Review of Systems  Constitutional: Negative for chills, diaphoresis, fever and malaise/fatigue (Just occasional exercise intolerance).  HENT: Negative for congestion and nosebleeds.   Eyes: Negative for blurred vision.  Respiratory: Negative for cough, shortness of breath (Only with exertion) and wheezing.   Gastrointestinal: Negative for abdominal pain, blood in stool, constipation, heartburn and melena.  Genitourinary: Negative for dysuria and hematuria.  Musculoskeletal: Positive for joint pain. Negative for myalgias.  Skin: Negative.   Neurological: Negative for dizziness and focal weakness.  Psychiatric/Behavioral: Negative for depression, hallucinations and suicidal ideas. The patient is nervous/anxious (Lots of social stress).        Again - stressed out about being away from her husband today  All other systems reviewed and are negative.  I have reviewed and (if needed) personally updated the patient's problem  list, medications, allergies, past medical and surgical history, social and family history.   Past Medical History:  Diagnosis Date   ALLERGIC RHINITIS 06/24/2007   Anemia    AVM (arteriovenous malformation) of colon    Blood transfusion without reported diagnosis 05/2015   COLONIC POLYPS, HX OF 06/24/2007   Coronary artery disease, non-occlusive 05/2015   Positive troponin in setting of acute anemia.  Cardiac cath showed ostial ramus 60%, ostial RCA 30% and distal LAD 40 and 50%.   DEPRESSION 10/08/2006   DIABETES MELLITUS, TYPE II 10/05/2006   GERD 06/24/2007   HYPERLIPIDEMIA 10/08/2006   HYPERTENSION 10/08/2006   INSOMNIA 09/24/2008   Left knee DJD    Moderate to severe aortic stenosis 05/2015   a) Moderate aortic stenosis (mean gradient 22 mmHg, peak 42 mmHg -AVA 1.1-1.2 cm.).;;b) 04/2017: Severe calcified aortic valve with moderate-severe aortic stenosis (peak-mean gradient 60-34 mmHg -moderate), calculated AVA 0.8 cm -severe..   Myocardial infarction ([place]) 05/2015   Type 2 MI - related to GI Bleed / Anemia (non-obstructive CAD by Cath)   Osteoporosis 03/10/2016   PEPTIC ULCER DISEASE 10/08/2006    Past Surgical History:  Procedure Laterality Date   APPENDECTOMY     BREAST BIOPSY     CARDIAC CATHETERIZATION N/A 05/21/2015   Procedure: Left Heart Cath and Coronary Angiography;  Surgeon: Leonie Man, MD;  Location: [facility];  Service: Cardiovascular;: Ost RI 60%, Ost RCA 30%, dLAD tapers to small vessel w/ 40-50%. Mildly elevated LVEDP. Normal LV Fxn.   COLONOSCOPY N/A 05/20/2015   Procedure: COLONOSCOPY;  Surgeon: Manus Gunning, MD;  Location: [facility];  Service: Gastroenterology;  Laterality: N/A;   ESOPHAGOGASTRODUODENOSCOPY N/A 05/20/2015   Procedure: ESOPHAGOGASTRODUODENOSCOPY (EGD);  Surgeon: Manus Gunning, MD;  Location: [facility];  Service: Gastroenterology;  Laterality: N/A;   TONSILLECTOMY     TRANSTHORACIC ECHOCARDIOGRAM   10/'15; 4/'17   a) mild AS, mildly elevated PA pressures.;; b)  Moderate LVH.  EF 55-60%.  Moderate aortic stenosis (mean gradient 22 mmHg, peak 42 mmHg -AVA 1.1-1.2 cm.).  Severe LA dilation.  Moderate TR with RVSP peak 51 mmHg and dilated IVC   TRANSTHORACIC ECHOCARDIOGRAM  04/12/2017   EF 60-65%. No RWMA.  GR 1 DD.  Moderate concentric LVH.  Mild LA dilation. Severe calcified aortic valve with moderate-severe aortic stenosis (peak/mean gradients 60/34 mmHg) and in severe range by AVA (0.8 cm2).  Mild to moderately increased PA pressures 41 mmHg with normal RV size and function..     TUBAL LIGATION      Current Meds  Medication Sig   acetaminophen (TYLENOL) 500 MG tablet Take 500 mg by mouth every 6 (six) hours as needed for moderate pain.   atorvastatin (LIPITOR) 80 MG tablet TAKE 1 TABLET (80 MG TOTAL) BY MOUTH DAILY AT 6 PM.   azithromycin (ZITHROMAX Z-PAK) 250 MG tablet 2 tab by mouth day 1, then 1 per day   blood glucose meter kit and supplies Dispense based on patient and insurance preference. Use up to four times  daily as directed. E11.9   Blood Glucose Monitoring Suppl (ONE TOUCH ULTRA 2) w/Device KIT Use as directed daily   carvedilol (COREG) 6.25 MG tablet TAKE 1 TABLET BY MOUTH TWICE A DAY   clonazePAM (KLONOPIN) 0.5 MG tablet TAKE 1 TABLET BY MOUTH TWICE A DAY AS NEEDED FOR ANXIETY   glimepiride (AMARYL) 2 MG tablet TAKE 1 TABLET (2 MG TOTAL) BY MOUTH DAILY BEFORE BREAKFAST.   glucose blood (ONE TOUCH ULTRA TEST) test strip Use as instructed Test 1-2 times a day   metFORMIN (GLUCOPHAGE) 1000 MG tablet 1 TAB BY MOUTH Twice per day   ONETOUCH DELICA LANCETS 57D MISC use as directed test 1-2 times a day   pantoprazole (PROTONIX) 40 MG tablet Take 1 tablet (40 mg total) by mouth daily.   predniSONE (DELTASONE) 10 MG tablet 3 tabs by mouth per day for 3 days,2tabs per day for 3 days,1tab per day for 3 days   telmisartan (MICARDIS) 40 MG tablet TAKE 1 TABLET BY MOUTH  EVERY DAY   vitamin C (VITAMIN C) 500 MG tablet Take 1 tablet (500 mg total) by mouth 2 (two) times daily.    Allergies  Allergen Reactions   Ibuprofen Other (See Comments)    Bleeding events    Social History   Tobacco Use   Smoking status: Never Smoker   Smokeless tobacco: Never Used  Substance Use Topics   Alcohol use: No    Alcohol/week: 0.0 standard drinks   Drug use: No   Social History   Social History Narrative   Not on file   Significant social stressor: 11 year old nephew recently committed suicide (even more concerning because her first husband also committed suicide).  Also her sister Mikle Bosworth has become acutely ill.   family history includes Alcohol abuse in an other family member; Arrhythmia in her sister; Arthritis in an other family member; CAD in her sister; Cancer in her mother; Depression in an other family member; Diabetes in an other family member; Heart attack (age of onset: 8) in her sister; Heart disease in an other family member; Hyperlipidemia in an other family member; Hypertension in an other family member; Stroke in an other family member.  Wt Readings from Last 3 Encounters:  08/05/18 213 lb 9.6 oz (96.9 kg)  03/14/18 210 lb (95.3 kg)  01/17/18 206 lb 6.4 oz (93.6 kg)    PHYSICAL EXAM BP (!) 188/68    Pulse 67    Ht '5\' 1"'$  (1.549 m)    Wt 213 lb 9.6 oz (96.9 kg)    BMI 40.36 kg/m  Physical Exam  Constitutional: She is oriented to person, place, and time. She appears well-developed and well-nourished. No distress.  Appears relatively healthy.  Well-groomed.  Truncal obesity  HENT:  Head: Normocephalic and atraumatic.  Mouth/Throat: Oropharynx is clear and moist.  Neck: Normal range of motion. Neck supple. No hepatojugular reflux and no JVD present. Carotid bruit is not present (Radiated aortic murmur). No thyromegaly present.  Cardiovascular: Normal rate and intact distal pulses.  No extrasystoles are present. PMI is not displaced.  Exam reveals no gallop and no friction rub.  Murmur heard. High-pitched harsh crescendo-decrescendo midsystolic murmur is present with a grade of 2/6 at the upper right sternal border radiating to the neck. Pulmonary/Chest: Effort normal and breath sounds normal. No respiratory distress. She has no wheezes. She has no rales.  Abdominal: Soft. Bowel sounds are normal. She exhibits no distension. There is no abdominal tenderness. There is no rebound.  Musculoskeletal: Normal range of motion.        General: No edema.  Neurological: She is alert and oriented to person, place, and time.  Psychiatric: She has a normal mood and affect. Her behavior is normal. Judgment and thought content normal.  Nursing note and vitals reviewed.    Adult ECG Report Not checked  Other studies Reviewed: Additional studies/ records that were reviewed today include:  Recent Labs:    Lab Results  Component Value Date   CHOL 68 03/14/2018   HDL 20.90 (L) 03/14/2018   LDLCALC 29 03/14/2018   TRIG 91.0 03/14/2018   CHOLHDL 3 03/14/2018   Lab Results  Component Value Date   HGBA1C 6.9 (H) 03/14/2018    ASSESSMENT / PLAN: Problem List Items Addressed This Visit    Pulmonary hypertension ([place]) - Primary (Chronic)    Continue to follow on echo.  She does not really have concerning symptoms.  Perhaps she is able to become more active process. Continue to manage systemic hypertension as some of this is probably related to pulmonary venous congestion.      Morbid obesity ([place]) (Chronic)    The patient understands the need to lose weight with diet and exercise. We have discussed specific strategies for this. Hoping that she can get some help caregiving for her husband which would then allow her to be more active and walk.      Moderate to severe aortic stenosis (Chronic)    Mild progression with mean gradient of 36.  However remains moderate to severe stenosis.  Not likely to be significant enough to have  symptoms.  Murmur sounds relatively stable. Normal carotid upstroke.  Plan will be to recheck echocardiogram in roughly October timeframe.  I will then see her back after that.  But if stable we can probably consider delaying another follow-up unless symptoms worsen.  We did discuss symptoms of angina, heart failure symptoms of dyspnea etc., syncope or near syncope as warning signs symptoms.      Relevant Orders   EKG 12-Lead (Completed)   ECHOCARDIOGRAM COMPLETE   Hyperlipidemia with target LDL less than 70 (Chronic)    Labs look pretty great on 80 mg of atorvastatin.  I think my recommendation would be to probably consider cutting that dose down to 40 mg.   Will defer to PCP, but is probably okay reduce the dose.      Essential hypertension (Chronic)    Pretty significantly elevated blood pressure today. Plan: Increase telmisartan to 80 mg daily.  Would not will need to overlook an aggressive based on presence of aortic stenosis      Relevant Orders   EKG 12-Lead (Completed)   Coronary artery disease, non-occlusive (Chronic)    The back she had normal coronary arteries not that long ago, I doubt that her exertional dyspnea is related to coronary ischemia. Not currently on aspirin for history of GI bleed. He is on high-dose statin however along with beta-blocker and ARB.      Relevant Orders   ECHOCARDIOGRAM COMPLETE   Chronic diastolic heart failure ([facility]) (Chronic)    With the aortic stenosis, she probably clearly has diastolic dysfunction although only: Grade 1 diastolic dysfunction.  I suspect that this is probably on recall.  She would not tolerate tachycardia reticulation with periodic stenosis and moderate LVH.  Would not allow her to be dehydrated.  She is on low-dose chlorthalidone and doing well.      Relevant Orders   ECHOCARDIOGRAM COMPLETE  Current medicines are reviewed at length with the patient today. (+/- concerns) n/a The following changes have been  made: n/a  Patient Instructions  Medication Instructions:  NO CHANGES  If you need a refill on your cardiac medications before your next appointment, please call your pharmacy.   Lab work: NOT NEEDED   Testing/Procedures: WILL BE SCHEDULE [place] 2020 Your physician has requested that you have an echocardiogram. Echocardiography is a painless test that uses sound waves to create images of your heart. It provides your doctor with information about the size and shape of your heart and how well your hearts chambers and valves are working. This procedure takes approximately one hour. There are no restrictions for this procedure.    Follow-Up: At [facility], you and your health needs are our priority.  As part of our continuing mission to provide you with exceptional heart care, we have created designated Provider Care Teams.  These Care Teams include your primary Cardiologist (physician) and Advanced Practice Providers (APPs -  Physician Assistants and Nurse Practitioners) who all work together to provide you with the care you need, when you need it.  You will need a follow up appointment in 7 T0 8 months JAN/FEB 2021.  Please call our office 2 months in advance to schedule this appointment.  You may see Glenetta Hew, MD or one of the following Advanced Practice Providers on your designated Care Team:    Rosaria Ferries, PA-C  Jory Sims, DNP, ANP  Any Other Special Instructions Will Be Listed Below (If Applicable).   Studies Ordered:   Orders Placed This Encounter  Procedures   EKG 12-Lead   ECHOCARDIOGRAM COMPLETE      Glenetta Hew, M.D., M.S. Interventional Cardiologist   Pager # [phone] Phone # [phone] [address] [place], [place] [address]   Thank you for choosing Heartcare at [facility]!!

## 2018-08-07 ENCOUNTER — Encounter: Payer: Self-pay | Admitting: Cardiology

## 2018-08-07 NOTE — Assessment & Plan Note (Signed)
The patient understands the need to lose weight with diet and exercise. We have discussed specific strategies for this. Hoping that she can get some help caregiving for her husband which would then allow her to be more active and walk.

## 2018-08-07 NOTE — Assessment & Plan Note (Signed)
With the aortic stenosis, she probably clearly has diastolic dysfunction although only: Grade 1 diastolic dysfunction.  I suspect that this is probably on recall.  She would not tolerate tachycardia reticulation with periodic stenosis and moderate LVH.  Would not allow her to be dehydrated.  She is on low-dose chlorthalidone and doing well.

## 2018-08-07 NOTE — Assessment & Plan Note (Signed)
Labs look pretty great on 80 mg of atorvastatin.  I think my recommendation would be to probably consider cutting that dose down to 40 mg.   Will defer to PCP, but is probably okay reduce the dose.

## 2018-08-07 NOTE — Assessment & Plan Note (Signed)
Pretty significantly elevated blood pressure today. Plan: Increase telmisartan to 80 mg daily.  Would not will need to overlook an aggressive based on presence of aortic stenosis

## 2018-08-07 NOTE — Assessment & Plan Note (Addendum)
Mild progression with mean gradient of 36.  However remains moderate to severe stenosis.  Not likely to be significant enough to have symptoms.  Murmur sounds relatively stable. Normal carotid upstroke.  Plan will be to recheck echocardiogram in roughly October timeframe.  I will then see her back after that.  But if stable we can probably consider delaying another follow-up unless symptoms worsen.  We did discuss symptoms of angina, heart failure symptoms of dyspnea etc., syncope or near syncope as warning signs symptoms.

## 2018-08-07 NOTE — Assessment & Plan Note (Signed)
Continue to follow on echo.  She does not really have concerning symptoms.  Perhaps she is able to become more active process. Continue to manage systemic hypertension as some of this is probably related to pulmonary venous congestion.

## 2018-08-07 NOTE — Assessment & Plan Note (Signed)
The back she had normal coronary arteries not that long ago, I doubt that her exertional dyspnea is related to coronary ischemia. Not currently on aspirin for history of GI bleed. He is on high-dose statin however along with beta-blocker and ARB.

## 2018-08-10 ENCOUNTER — Other Ambulatory Visit: Payer: Self-pay | Admitting: Internal Medicine

## 2018-08-12 NOTE — Telephone Encounter (Signed)
Done erx 

## 2018-08-20 DIAGNOSIS — H25012 Cortical age-related cataract, left eye: Secondary | ICD-10-CM | POA: Diagnosis not present

## 2018-08-20 DIAGNOSIS — H2512 Age-related nuclear cataract, left eye: Secondary | ICD-10-CM | POA: Diagnosis not present

## 2018-08-20 DIAGNOSIS — H25812 Combined forms of age-related cataract, left eye: Secondary | ICD-10-CM | POA: Diagnosis not present

## 2018-09-02 ENCOUNTER — Other Ambulatory Visit: Payer: Self-pay | Admitting: Internal Medicine

## 2018-09-13 ENCOUNTER — Ambulatory Visit (INDEPENDENT_AMBULATORY_CARE_PROVIDER_SITE_OTHER): Payer: Medicare Other | Admitting: Internal Medicine

## 2018-09-13 ENCOUNTER — Encounter: Payer: Self-pay | Admitting: Internal Medicine

## 2018-09-13 ENCOUNTER — Other Ambulatory Visit: Payer: Self-pay

## 2018-09-13 VITALS — BP 144/90 | HR 64 | Temp 97.6°F | Ht 61.0 in | Wt 212.0 lb

## 2018-09-13 DIAGNOSIS — Z0000 Encounter for general adult medical examination without abnormal findings: Secondary | ICD-10-CM | POA: Diagnosis not present

## 2018-09-13 DIAGNOSIS — E119 Type 2 diabetes mellitus without complications: Secondary | ICD-10-CM

## 2018-09-13 DIAGNOSIS — E785 Hyperlipidemia, unspecified: Secondary | ICD-10-CM

## 2018-09-13 DIAGNOSIS — Z23 Encounter for immunization: Secondary | ICD-10-CM

## 2018-09-13 DIAGNOSIS — E611 Iron deficiency: Secondary | ICD-10-CM | POA: Diagnosis not present

## 2018-09-13 DIAGNOSIS — I10 Essential (primary) hypertension: Secondary | ICD-10-CM

## 2018-09-13 DIAGNOSIS — E559 Vitamin D deficiency, unspecified: Secondary | ICD-10-CM | POA: Diagnosis not present

## 2018-09-13 DIAGNOSIS — E538 Deficiency of other specified B group vitamins: Secondary | ICD-10-CM

## 2018-09-13 LAB — POCT GLYCOSYLATED HEMOGLOBIN (HGB A1C): Hemoglobin A1C: 6.6 % — AB (ref 4.0–5.6)

## 2018-09-13 NOTE — Patient Instructions (Signed)
You had the Tdap tetanus shot today  Your A1c was OK today  Please continue all other medications as before, and refills have been done if requested.  Please have the pharmacy call with any other refills you may need.  Please continue your efforts at being more active, low cholesterol diet, and weight control.  Please keep your appointments with your specialists as you may have planned  Please return in 6 months, or sooner if needed, with Lab testing done 3-5 days before  

## 2018-09-13 NOTE — Progress Notes (Signed)
Subjective:    Patient ID: Mary Mccarthy, female    DOB: 07-23-1940, 78 y.o.   MRN: [id number]  HPI  Here to f/u; overall doing ok,  Pt denies chest pain, increasing sob or doe, wheezing, orthopnea, PND, increased LE swelling, palpitations, dizziness or syncope.  Pt denies new neurological symptoms such as new headache, or facial or extremity weakness or numbness.  Pt denies polydipsia, polyuria, or low sugar episode.  Pt states overall good compliance with meds, mostly trying to follow appropriate diet, with wt overall stable,  but little exercise however. BP < 140/90 at home per pt BP Readings from Last 3 Encounters:  09/13/18 (!) 144/90  08/05/18 (!) 188/68  03/14/18 (!) 144/88  s/p left eye cataract recently, for right done soon.   Past Medical History:  Diagnosis Date  . ALLERGIC RHINITIS 06/24/2007  . Anemia   . AVM (arteriovenous malformation) of colon   . Blood transfusion without reported diagnosis 05/2015  . COLONIC POLYPS, HX OF 06/24/2007  . Coronary artery disease, non-occlusive 05/2015   Positive troponin in setting of acute anemia.  Cardiac cath showed ostial ramus 60%, ostial RCA 30% and distal LAD 40 and 50%.  . DEPRESSION 10/08/2006  . DIABETES MELLITUS, TYPE II 10/05/2006  . GERD 06/24/2007  . HYPERLIPIDEMIA 10/08/2006  . HYPERTENSION 10/08/2006  . INSOMNIA 09/24/2008  . Left knee DJD   . Moderate to severe aortic stenosis 05/2015   a) Moderate aortic stenosis (mean gradient 22 mmHg, peak 42 mmHg -AVA 1.1-1.2 cm.).;;b) 04/2017: Severe calcified aortic valve with moderate-severe aortic stenosis (peak-mean gradient 60-34 mmHg -moderate), calculated AVA 0.8 cm -severe..  . Myocardial infarction ([place]) 05/2015   Type 2 MI - related to GI Bleed / Anemia (non-obstructive CAD by Cath)  . Osteoporosis 03/10/2016  . PEPTIC ULCER DISEASE 10/08/2006   Past Surgical History:  Procedure Laterality Date  . APPENDECTOMY    . BREAST BIOPSY    . CARDIAC CATHETERIZATION N/A 05/21/2015   Procedure: Left Heart Cath and Coronary Angiography;  Surgeon: Leonie Man, MD;  Location: [facility];  Service: Cardiovascular;: Ost RI 60%, Ost RCA 30%, dLAD tapers to small vessel w/ 40-50%. Mildly elevated LVEDP. Normal LV Fxn.  . COLONOSCOPY N/A 05/20/2015   Procedure: COLONOSCOPY;  Surgeon: Manus Gunning, MD;  Location: [facility];  Service: Gastroenterology;  Laterality: N/A;  . ESOPHAGOGASTRODUODENOSCOPY N/A 05/20/2015   Procedure: ESOPHAGOGASTRODUODENOSCOPY (EGD);  Surgeon: Manus Gunning, MD;  Location: [facility];  Service: Gastroenterology;  Laterality: N/A;  . TONSILLECTOMY    . TRANSTHORACIC ECHOCARDIOGRAM  10/'15; 4/'17   a) mild AS, mildly elevated PA pressures.;; b)  Moderate LVH.  EF 55-60%.  Moderate aortic stenosis (mean gradient 22 mmHg, peak 42 mmHg -AVA 1.1-1.2 cm.).  Severe LA dilation.  Moderate TR with RVSP peak 51 mmHg and dilated IVC  . TRANSTHORACIC ECHOCARDIOGRAM  04/12/2017   EF 60-65%. No RWMA.  GR 1 DD.  Moderate concentric LVH.  Mild LA dilation. Severe calcified aortic valve with moderate-severe aortic stenosis (peak/mean gradients 60/34 mmHg) and in severe range by AVA (0.8 cm2).  Mild to moderately increased PA pressures 41 mmHg with normal RV size and function..    . TUBAL LIGATION      reports that she has never smoked. She has never used smokeless tobacco. She reports that she does not drink alcohol or use drugs. family history includes Alcohol abuse in an other family member; Arrhythmia in her sister; Arthritis in an  other family member; CAD in her sister; Cancer in her mother; Depression in an other family member; Diabetes in an other family member; Heart attack (age of onset: 44) in her sister; Heart disease in an other family member; Hyperlipidemia in an other family member; Hypertension in an other family member; Stroke in an other family member. Allergies  Allergen Reactions  . Ibuprofen Other (See Comments)    Bleeding  events   Current Outpatient Medications on File Prior to Visit  Medication Sig Dispense Refill  . acetaminophen (TYLENOL) 500 MG tablet Take 500 mg by mouth every 6 (six) hours as needed for moderate pain.    Marland Kitchen atorvastatin (LIPITOR) 80 MG tablet TAKE 1 TABLET (80 MG TOTAL) BY MOUTH DAILY AT 6 PM. 90 tablet 1  . azithromycin (ZITHROMAX Z-PAK) 250 MG tablet 2 tab by mouth day 1, then 1 per day 6 tablet 1  . blood glucose meter kit and supplies Dispense based on patient and insurance preference. Use up to four times daily as directed. E11.9 1 each 0  . Blood Glucose Monitoring Suppl (ONE TOUCH ULTRA 2) w/Device KIT Use as directed daily 1 each 0  . carvedilol (COREG) 6.25 MG tablet TAKE 1 TABLET BY MOUTH TWICE A DAY 180 tablet 1  . clonazePAM (KLONOPIN) 0.5 MG tablet TAKE 1 TABLET BY MOUTH TWICE A DAY AS NEEDED FOR ANXIETY 60 tablet 2  . glimepiride (AMARYL) 2 MG tablet TAKE 1 TABLET (2 MG TOTAL) BY MOUTH DAILY BEFORE BREAKFAST. 90 tablet 1  . glucose blood (ONE TOUCH ULTRA TEST) test strip Use as instructed Test 1-2 times a day 200 each 11  . metFORMIN (GLUCOPHAGE) 1000 MG tablet 1 TAB BY MOUTH Twice per day 180 tablet 3  . ONETOUCH DELICA LANCETS 48O MISC use as directed test 1-2 times a day 200 each 11  . pantoprazole (PROTONIX) 40 MG tablet Take 1 tablet (40 mg total) by mouth daily. 90 tablet 3  . predniSONE (DELTASONE) 10 MG tablet 3 tabs by mouth per day for 3 days,2tabs per day for 3 days,1tab per day for 3 days 18 tablet 0  . telmisartan (MICARDIS) 40 MG tablet TAKE 1 TABLET BY MOUTH EVERY DAY 90 tablet 2  . vitamin C (VITAMIN C) 500 MG tablet Take 1 tablet (500 mg total) by mouth 2 (two) times daily. 60 tablet 0  . chlorthalidone (HYGROTON) 25 MG tablet Take 1 tablet (25 mg total) by mouth daily. 90 tablet 3   No current facility-administered medications on file prior to visit.    Review of Systems  Constitutional: Negative for other unusual diaphoresis or sweats HENT: Negative for  ear discharge or swelling Eyes: Negative for other worsening visual disturbances Respiratory: Negative for stridor or other swelling  Gastrointestinal: Negative for worsening distension or other blood Genitourinary: Negative for retention or other urinary change Musculoskeletal: Negative for other MSK pain or swelling Skin: Negative for color change or other new lesions Neurological: Negative for worsening tremors and other numbness  Psychiatric/Behavioral: Negative for worsening agitation or other fatigue All other system neg per pt    Objective:   Physical Exam BP (!) 144/90   Pulse 64   Temp 97.6 F (36.4 C) (Oral)   Ht '5\' 1"'$  (1.549 m)   Wt 212 lb (96.2 kg)   SpO2 98%   BMI 40.06 kg/m  VS noted, not ill appearing Constitutional: Pt appears in NAD HENT: Head: NCAT.  Right Ear: External ear normal.  Left Ear: External  ear normal.  Eyes: . Pupils are equal, round, and reactive to light. Conjunctivae and EOM are normal Nose: without d/c or deformity Neck: Neck supple. Gross normal ROM Cardiovascular: Normal rate and regular rhythm.   Pulmonary/Chest: Effort normal and breath sounds without rales or wheezing.  Abd:  Soft, NT, ND, + BS, no organomegaly Neurological: Pt is alert. At baseline orientation, motor grossly intact Skin: Skin is warm. No rashes, other new lesions, no LE edema Psychiatric: Pt behavior is normal without agitation  No other exam findings  Contains abnormal data POCT glycosylated hemoglobin (Hb A1C) Order: [id number] Status:  Final result Visible to patient:  No (not released) Dx:  Type 2 diabetes mellitus without comp...  Ref Range & Units 10:55 (09/13/18) 53moago (03/14/18) 156yrgo (09/11/17) 1y45yro (03/14/17) 4746yr15yr (09/07/16) 4746yr 75yr(03/06/16) 44yr a85yr4/25/17)  Hemoglobin A1C 4.0 - 5.6 % 6.6Abnormal   6.9High  R, CM  7.5High  R, CM  6.5 R, CM  6.2 R, CM  6.3 R, CM  5.8            Assessment & Plan:

## 2018-09-14 ENCOUNTER — Encounter: Payer: Self-pay | Admitting: Internal Medicine

## 2018-09-14 NOTE — Assessment & Plan Note (Signed)
stable overall by history and exam, recent data reviewed with pt, and pt to continue medical treatment as before,  to f/u any worsening symptoms or concerns  

## 2018-09-24 DIAGNOSIS — H25011 Cortical age-related cataract, right eye: Secondary | ICD-10-CM | POA: Diagnosis not present

## 2018-09-24 DIAGNOSIS — H25811 Combined forms of age-related cataract, right eye: Secondary | ICD-10-CM | POA: Diagnosis not present

## 2018-09-24 DIAGNOSIS — H2511 Age-related nuclear cataract, right eye: Secondary | ICD-10-CM | POA: Diagnosis not present

## 2018-10-07 ENCOUNTER — Other Ambulatory Visit: Payer: Self-pay

## 2018-10-07 NOTE — Patient Outreach (Signed)
[facility]) Care Management  10/07/2018  Mary Mccarthy 11/19/1940 [id number]   Medication Adherence call to Mary Mccarthy [facility] Identifiers Verify spoke with pt she is past due on Telmisartan 40 mg patient explain she takes one tablet daily patient [facility] make sure [facility] had [facility].Mary Mccarthy is showing past due under [place].   [facility] Management Direct Dial [phone]  Fax [phone] [email or web address]

## 2018-10-07 NOTE — Patient Outreach (Signed)
[facility]) Care Management  10/07/2018  Mary Mccarthy August 06, 1940 [id number]   Medication Adherence call to Mary Mccarthy HIPPA Compliant Voice message left with a call back number. Mary Mccarthy is showing past due on Telmisartan 40 mg under [place].  [facility] Management Direct Dial [phone]  Fax [phone] [email or web address]

## 2018-10-08 DIAGNOSIS — I4729 Other ventricular tachycardia: Secondary | ICD-10-CM

## 2018-10-08 HISTORY — DX: Other ventricular tachycardia: I47.29

## 2018-10-19 ENCOUNTER — Other Ambulatory Visit: Payer: Self-pay | Admitting: Internal Medicine

## 2018-11-27 ENCOUNTER — Other Ambulatory Visit: Payer: Self-pay | Admitting: Internal Medicine

## 2018-11-27 NOTE — Telephone Encounter (Signed)
[facility] Controlled Database Checked Last filled: 10/26/18 # 60 LOV w/you: 09/13/18 Next appt w/you: 03/19/19

## 2018-11-27 NOTE — Telephone Encounter (Signed)
Done erx 

## 2018-12-05 ENCOUNTER — Other Ambulatory Visit (HOSPITAL_COMMUNITY): Payer: Medicare Other

## 2018-12-19 ENCOUNTER — Ambulatory Visit: Payer: Medicare Other

## 2018-12-26 ENCOUNTER — Other Ambulatory Visit: Payer: Self-pay

## 2018-12-26 ENCOUNTER — Ambulatory Visit (INDEPENDENT_AMBULATORY_CARE_PROVIDER_SITE_OTHER): Payer: Medicare Other

## 2018-12-26 DIAGNOSIS — Z23 Encounter for immunization: Secondary | ICD-10-CM | POA: Diagnosis not present

## 2019-01-05 ENCOUNTER — Other Ambulatory Visit: Payer: Self-pay | Admitting: Cardiology

## 2019-01-06 ENCOUNTER — Other Ambulatory Visit: Payer: Self-pay | Admitting: Cardiology

## 2019-01-06 MED ORDER — TELMISARTAN 40 MG PO TABS: 40.0000 mg | ORAL_TABLET | Freq: Every day | ORAL | 0 refills | Status: DC

## 2019-01-06 NOTE — Telephone Encounter (Signed)
New Message    *STAT* If patient is at the pharmacy, call can be transferred to refill team.   1. Which medications need to be refilled? (please list name of each medication and dose if known) telmisartan (MICARDIS) 40 MG tablet  2. Which pharmacy/location (including street and city if local pharmacy) is medication to be sent to? [id number] [place] [place] - [address] AT [place]  3. Do they need a 30 day or 90 day supply? 30 day

## 2019-01-09 DIAGNOSIS — Z961 Presence of intraocular lens: Secondary | ICD-10-CM | POA: Diagnosis not present

## 2019-01-21 ENCOUNTER — Other Ambulatory Visit: Payer: Self-pay | Admitting: Internal Medicine

## 2019-01-24 ENCOUNTER — Other Ambulatory Visit: Payer: Self-pay | Admitting: Cardiology

## 2019-01-27 NOTE — Telephone Encounter (Signed)
Rx(s) sent to pharmacy electronically.  

## 2019-02-27 ENCOUNTER — Other Ambulatory Visit: Payer: Self-pay | Admitting: Internal Medicine

## 2019-03-06 ENCOUNTER — Encounter: Payer: Self-pay | Admitting: Cardiology

## 2019-03-06 ENCOUNTER — Other Ambulatory Visit: Payer: Self-pay

## 2019-03-06 ENCOUNTER — Ambulatory Visit: Payer: Medicare Other | Admitting: Cardiology

## 2019-03-06 VITALS — BP 169/69 | HR 63 | Ht 61.0 in | Wt 213.2 lb

## 2019-03-06 DIAGNOSIS — I272 Pulmonary hypertension, unspecified: Secondary | ICD-10-CM

## 2019-03-06 DIAGNOSIS — I350 Nonrheumatic aortic (valve) stenosis: Secondary | ICD-10-CM | POA: Diagnosis not present

## 2019-03-06 DIAGNOSIS — I2510 Atherosclerotic heart disease of native coronary artery without angina pectoris: Secondary | ICD-10-CM

## 2019-03-06 DIAGNOSIS — E785 Hyperlipidemia, unspecified: Secondary | ICD-10-CM

## 2019-03-06 DIAGNOSIS — Z289 Immunization not carried out for unspecified reason: Secondary | ICD-10-CM

## 2019-03-06 DIAGNOSIS — I5032 Chronic diastolic (congestive) heart failure: Secondary | ICD-10-CM | POA: Diagnosis not present

## 2019-03-06 DIAGNOSIS — I10 Essential (primary) hypertension: Secondary | ICD-10-CM | POA: Diagnosis not present

## 2019-03-06 MED ORDER — TELMISARTAN 80 MG PO TABS: 80.0000 mg | ORAL_TABLET | Freq: Every day | ORAL | 3 refills | Status: DC

## 2019-03-06 NOTE — Progress Notes (Signed)
 Primary Care Provider: John, James W, MD Cardiologist: David Harding, MD Electrophysiologist: None  Clinic Note: Chief Complaint  Patient presents with  . Follow-up    No major complaints  . Aortic Stenosis    Has not had follow-up echo    HPI:    Mary Mccarthy is a 79 y.o. female with a PMH below who presents today for 6-month follow-up for moderate to severe aortic stenosis and hypertension, and pulmonary hypertension with morbid obesity with prior history of SVT.  Being followed at the request of Dr. James John.  Her family history is notable for the fact that her sister (Mary Mccarthy) is a well-known patient to our practice who was a patient of Dr. Croitoru -.  She had significant CAD and arrhythmia issues - died Jan 04, 2018.  Cardiac cath in April 2017 showed mild-moderate nonobstructive CAD with moderate aortic stenosis on echo.  Mary Mccarthy was last seen on August 05, 2018 -> blood pressure was high, but she indicated that she had not been on her medications.  Recent Hospitalizations: None  Reviewed  CV studies:    The following studies were reviewed today: (if available, images/films reviewed: From Epic Chart or Care Everywhere) . Echocardiogram was ordered, but not done -> she have been a little sick, but has been quite down and depressed as her husband died in October relatively unexpectedly with metastatic cancer.  He had not been feeling well for several months, but put off getting checked.  By the time he got checked, things got too far.   Interval History:   [name] returns here today for follow-up really without any cardiac symptoms.  She has not really been all that active in the last several months most notably since her husband passed back in October.  She has been very upset and had a hard time with how things ended.  There was a lot of hurtful things that were said prior to his dying that she is still bothered by.  She has not really been that  active in a social life since the funeral.  As such, she is not been very active and therefore has not noted any symptoms at all of chest tightness or pressure with rest or exertion.  No real heart failure symptoms or arrhythmia symptoms.  CV Review of Symptoms (Summary): no chest pain or dyspnea on exertion negative for - edema, irregular heartbeat, orthopnea, palpitations, paroxysmal nocturnal dyspnea, rapid heart rate, shortness of breath or No syncope or near syncope or TIA/amaurosis fugax.  The patient does not have symptoms concerning for COVID-19 infection (fever, chills, cough, or new shortness of breath).  The patient is practicing social distancing & Masking.   She has been putting off deciding about getting her Covid shot until seeing me.   REVIEWED OF SYSTEMS   A comprehensive ROS was performed. Review of Systems  Constitutional: Negative for malaise/fatigue.  HENT: Negative for congestion and nosebleeds.   Respiratory: Negative for wheezing.   Cardiovascular: Negative for leg swelling.  Gastrointestinal: Negative for blood in stool and melena.  Genitourinary: Negative for hematuria.  Musculoskeletal: Positive for joint pain (Mild arthritis pains.).  Neurological: Negative for dizziness, focal weakness and weakness.  Psychiatric/Behavioral: Negative for memory loss. The patient has insomnia (Not sleeping well since her husband's death). The patient is not nervous/anxious.        Still having issues after her husband's death.  Very upsetting to her some the comments he made prior to his   final days.  She is upset that of their last few months together, they argued more than usual and things were difficult.  All other systems reviewed and are negative.   I have reviewed and (if needed) personally updated the patient's problem list, medications, allergies, past medical and surgical history, social and family history.   PAST MEDICAL HISTORY   Past Medical History:  Diagnosis  Date  . ALLERGIC RHINITIS 06/24/2007  . Anemia   . AVM (arteriovenous malformation) of colon   . Blood transfusion without reported diagnosis 05/2015  . COLONIC POLYPS, HX OF 06/24/2007  . Coronary artery disease, non-occlusive 05/2015   Positive troponin in setting of acute anemia.  Cardiac cath showed ostial ramus 60%, ostial RCA 30% and distal LAD 40 and 50%.  . DEPRESSION 10/08/2006  . DIABETES MELLITUS, TYPE II 10/05/2006  . GERD 06/24/2007  . HYPERLIPIDEMIA 10/08/2006  . HYPERTENSION 10/08/2006  . INSOMNIA 09/24/2008  . Left knee DJD   . Moderate to severe aortic stenosis 05/2015   a) Moderate aortic stenosis (mean gradient 22 mmHg, peak 42 mmHg -AVA 1.1-1.2 cm.).;;b) 04/2017: Severe calcified aortic valve with moderate-severe aortic stenosis (peak-mean gradient 60-34 mmHg -moderate), calculated AVA 0.8 cm -severe..  . Myocardial infarction (HCC) 05/2015   Type 2 MI - related to GI Bleed / Anemia (non-obstructive CAD by Cath)  . Osteoporosis 03/10/2016  . PEPTIC ULCER DISEASE 10/08/2006    PAST SURGICAL HISTORY   Past Surgical History:  Procedure Laterality Date  . APPENDECTOMY    . BREAST BIOPSY    . CARDIAC CATHETERIZATION N/A 05/21/2015   Procedure: Left Heart Cath and Coronary Angiography;  Surgeon: David W Harding, MD;  Location: [facility];  Service: Cardiovascular;: Ost RI 60%, Ost RCA 30%, dLAD tapers to small vessel w/ 40-50%. Mildly elevated LVEDP. Normal LV Fxn.  . COLONOSCOPY N/A 05/20/2015   Procedure: COLONOSCOPY;  Surgeon: Steven Paul Armbruster, MD;  Location: [facility];  Service: Gastroenterology;  Laterality: N/A;  . ESOPHAGOGASTRODUODENOSCOPY N/A 05/20/2015   Procedure: ESOPHAGOGASTRODUODENOSCOPY (EGD);  Surgeon: Steven Paul Armbruster, MD;  Location: [facility];  Service: Gastroenterology;  Laterality: N/A;  . TONSILLECTOMY    . TRANSTHORACIC ECHOCARDIOGRAM  10/'15; 4/'17   a) mild AS, mildly elevated PA pressures.;; b)  Moderate LVH.  EF 55-60%.  Moderate  aortic stenosis (mean gradient 22 mmHg, peak 42 mmHg -AVA 1.1-1.2 cm.).  Severe LA dilation.  Moderate TR with RVSP peak 51 mmHg and dilated IVC  . TRANSTHORACIC ECHOCARDIOGRAM  04/12/2017   EF 60-65%. No RWMA.  GR 1 DD.  Moderate concentric LVH.  Mild LA dilation. Severe calcified aortic valve with moderate-severe aortic stenosis (peak/mean gradients 60/34 mmHg) and in severe range by AVA (0.8 cm2).  Mild to moderately increased PA pressures 41 mmHg with normal RV size and function..    . TUBAL LIGATION      MEDICATIONS/ALLERGIES   Current Meds  Medication Sig  . acetaminophen (TYLENOL) 500 MG tablet Take 500 mg by mouth every 6 (six) hours as needed for moderate pain.  . atorvastatin (LIPITOR) 80 MG tablet TAKE 1 TABLET (80 MG TOTAL) BY MOUTH DAILY AT 6 PM.  . blood glucose meter kit and supplies Dispense based on patient and insurance preference. Use up to four times daily as directed. E11.9  . Blood Glucose Monitoring Suppl (ONE TOUCH ULTRA 2) w/Device KIT Use as directed daily  . carvedilol (COREG) 6.25 MG tablet Take 1 tablet (6.25 mg total) by mouth 2 (two)   times daily.  . chlorthalidone (HYGROTON) 25 MG tablet Take 1 tablet (25 mg total) by mouth daily.  . clonazePAM (KLONOPIN) 0.5 MG tablet TAKE 1 TABLET BY MOUTH TWICE A DAY AS NEEDED FOR ANXIETY  . glimepiride (AMARYL) 2 MG tablet TAKE 1 TABLET (2 MG TOTAL) BY MOUTH DAILY BEFORE BREAKFAST.  . glucose blood (ONE TOUCH ULTRA TEST) test strip Use as instructed Test 1-2 times a day  . metFORMIN (GLUCOPHAGE) 1000 MG tablet TAKE 1 TAB BY MOUTH IN THE AM,AND 1/2 TAB IN THE PM.  . ONETOUCH DELICA LANCETS 33G MISC use as directed test 1-2 times a day  . pantoprazole (PROTONIX) 40 MG tablet TAKE 1 TABLET BY MOUTH EVERY DAY  . vitamin C (VITAMIN C) 500 MG tablet Take 1 tablet (500 mg total) by mouth 2 (two) times daily.  . [DISCONTINUED] telmisartan (MICARDIS) 40 MG tablet Take 1 tablet (40 mg total) by mouth daily.    Allergies  Allergen  Reactions  . Ibuprofen Other (See Comments)    Bleeding events    SOCIAL HISTORY/FAMILY HISTORY   Social History   Tobacco Use  . Smoking status: Never Smoker  . Smokeless tobacco: Never Used  Substance Use Topics  . Alcohol use: No    Alcohol/week: 0.0 standard drinks  . Drug use: No   Social History   Social History Narrative  . Not on file    Family History family history includes Alcohol abuse in an other family member; Arrhythmia in her sister; Arthritis in an other family member; CAD in her sister; Cancer in her mother; Depression in an other family member; Diabetes in an other family member; Heart attack (age of onset: 55) in her sister; Heart disease in an other family member; Hyperlipidemia in an other family member; Hypertension in an other family member; Stroke in an other family member.  OBJCTIVE -PE, EKG, labs   Wt Readings from Last 3 Encounters:  03/06/19 213 lb 3.2 oz (96.7 kg)  09/13/18 212 lb (96.2 kg)  08/05/18 213 lb 9.6 oz (96.9 kg)    Physical Exam: BP (!) 169/69   Pulse 63   Ht 5' 1" (1.549 m)   Wt 213 lb 3.2 oz (96.7 kg)   SpO2 98%   BMI 40.28 kg/m  Physical Exam  Constitutional: She is oriented to person, place, and time. She appears well-developed and well-nourished. No distress.  Morbidly obese woman.  Truncal obesity.  Well-groomed  HENT:  Head: Normocephalic and atraumatic.  Neck: No hepatojugular reflux and no JVD present. Carotid bruit is not present (Radiated aortic murmur).  Cardiovascular: Normal rate, regular rhythm, S1 normal and S2 normal.  No extrasystoles are present. PMI is not displaced (Difficult to palpate). Exam reveals distant heart sounds and decreased pulses (Difficult to palpate because of body habitus). Exam reveals no gallop and no friction rub.  Murmur (2/6C-D SEM at RUSB --> neck) heard. Pulmonary/Chest: Effort normal and breath sounds normal. No respiratory distress. She has no wheezes. She has no rales.    Abdominal: Soft. Bowel sounds are normal. She exhibits no distension. There is no abdominal tenderness. There is no rebound.  Truncal obesity.  Unable assess HSM.  Musculoskeletal:        General: No edema. Normal range of motion.     Cervical back: Normal range of motion and neck supple.  Neurological: She is alert and oriented to person, place, and time.  Psychiatric: Her behavior is normal. Judgment and thought content normal.  Vitals   reviewed.   Adult ECG Report  Rate: 63 ;  Rhythm: normal sinus rhythm and ST and T wave changes with questionable ischemia in lateral leads.  Appear more to be repolarization related.;   Narrative Interpretation: Stable EKG  Recent Labs: Should be due for labs next month. Lab Results  Component Value Date   CHOL 68 03/14/2018   HDL 20.90 (L) 03/14/2018   LDLCALC 29 03/14/2018   TRIG 91.0 03/14/2018   CHOLHDL 3 03/14/2018   Lab Results  Component Value Date   CREATININE 0.93 03/14/2018   BUN 22 03/14/2018   NA 138 03/14/2018   K 4.2 03/14/2018   CL 103 03/14/2018   CO2 30 03/14/2018   Lab Results  Component Value Date   TSH 4.21 03/14/2018    ASSESSMENT/PLAN    Problem List Items Addressed This Visit    Moderate to severe aortic stenosis - Primary (Chronic)    Moderate severe stenosis by last echo.  Was due to have one checked in the fall.  We will reschedule for her now.  Thankfully, not having any active symptoms.  We discussed concerning symptoms of anginal type chest pain, heart failure symptoms of PND, orthopnea, edema or exertional dyspnea.  Also more concerned we discussed syncope.      Relevant Medications   telmisartan (MICARDIS) 80 MG tablet   Other Relevant Orders   EKG 12-Lead (Completed)   ECHOCARDIOGRAM COMPLETE   Hyperlipidemia with target LDL less than 70 (Chronic)    Had excellent lipids back in February 2020.  Is on high-dose atorvastatin.  I suspect that if her lipids look is good this year that we can probably  reduce her dose to 40 mg.      Relevant Medications   telmisartan (MICARDIS) 80 MG tablet   Essential hypertension (Chronic)    Blood pressure is again elevated today.  I will have her increase her ARB dose to 80 mg daily.  She should be having labs checked soon and we can reevaluate her renal functions.  With a heart rate of 63 bpm, probably would not be to increase carvedilol any further.  Next option for blood pressure control will be to add a calcium channel blocker such as amlodipine since she is already on adequate dose of carvedilol chlorthalidone.      Relevant Medications   telmisartan (MICARDIS) 80 MG tablet   Pulmonary hypertension (HCC) (Chronic)    Reevaluate on current echo.  Does not have a lot of edema or orthopnea.  Manage systemic blood pressure.      Relevant Medications   telmisartan (MICARDIS) 80 MG tablet   Morbid obesity (HCC) (Chronic)    The patient understands the need to lose weight with diet and exercise. We have discussed specific strategies for this.      Chronic diastolic heart failure (HCC) (Chronic)   Relevant Medications   telmisartan (MICARDIS) 80 MG tablet   Other Relevant Orders   ECHOCARDIOGRAM COMPLETE   Coronary artery disease, non-occlusive (Chronic)    Nonischemic/nonobstructive coronary disease by cardiac cath in 2017. Maintain adequate glycemic control per PCP.  Lipids are well controlled.  We need to improve blood pressure control.  Monitor for signs of angina.  Probably would not require ischemic evaluation until such time as her aortic valve becomes severe      Relevant Medications   telmisartan (MICARDIS) 80 MG tablet   Other Relevant Orders   EKG 12-Lead (Completed)   ECHOCARDIOGRAM COMPLETE   COVID-19 virus  vaccination not done    We spent probably 5 to 7 minutes talking about her concerns with Covid vaccine and and risks versus whether or not she should get it because of her self-isolation.   I explained to her that the  significant benefit of protection from sedation versus the risks of adverse events from the vaccine.  Being protected with vaccine would allow her to feel safer becoming more active part of her previous social life.          COVID-19 Education: The signs and symptoms of COVID-19 were discussed with the patient and how to seek care for testing (follow up with PCP or arrange E-visit).   The importance of social distancing was discussed today.  I spent a total of 79mnutes with the patient. >  50% of the time was spent in direct patient consultation.  Additional time spent with chart review  / charting (studies, outside notes, etc): 8 Total Time: 32 min  Current medicines are reviewed at length with the patient today.  (+/- concerns) none   Patient Instructions / Medication Changes & Studies & Tests Ordered   Patient Instructions  Medication Instructions:    Increase  Micardis ( telmisartan) 80 mg one tablet daily  No other changes  *If you need a refill on your cardiac medications before your next appointment, please call your pharmacy*  Lab Work: Not needed  Testing/Procedures:  [place]. Echocardiography is a painless test that uses sound waves to create images of your heart. It provides your doctor with information about the size and shape of your heart and how well your heart's chambers and valves are working. This procedure takes approximately one hour. There are no restrictions for this procedure.    Follow-Up: [facility] and your health needs are our priority.  As part of our continuing mission to provide you with exceptional heart care, we have created designated Provider Care Teams.  These Care Teams include your primary Cardiologist (physician) and Advanced Practice Providers (APPs -  Physician Assistants and Nurse Practitioners) who all work together to  provide you with the care you need, when you need it.  Your next appointment:   6 month(s)  The format for your next appointment:   In Person  Provider:   DGlenetta Hew MD    Studies Ordered:   Orders Placed This Encounter  Procedures  . EKG 12-Lead  . ECHOCARDIOGRAM COMPLETE     DGlenetta Hew M.D., M.S. Interventional Cardiologist   Pager # [phone] # [phone] [place] [address]  [facility] you [facility]!

## 2019-03-06 NOTE — Patient Instructions (Signed)
Medication Instructions:    Increase  Micardis ( telmisartan) 80 mg one tablet daily  No other changes  *If you need a refill on your cardiac medications before your next appointment, please call your pharmacy*  Lab Work: Not needed  Testing/Procedures:  will be schedule at [address] Your physician has requested that you have an echocardiogram. Echocardiography is a painless test that uses sound waves to create images of your heart. It provides your doctor with information about the size and shape of your heart and how well your heart's chambers and valves are working. This procedure takes approximately one hour. There are no restrictions for this procedure.    Follow-Up: At [facility], you and your health needs are our priority.  As part of our continuing mission to provide you with exceptional heart care, we have created designated Provider Care Teams.  These Care Teams include your primary Cardiologist (physician) and Advanced Practice Providers (APPs -  Physician Assistants and Nurse Practitioners) who all work together to provide you with the care you need, when you need it.  Your next appointment:   6 month(s)  The format for your next appointment:   In Person  Provider:   Bryan Lemma, MD

## 2019-03-08 ENCOUNTER — Encounter: Payer: Self-pay | Admitting: Cardiology

## 2019-03-08 NOTE — Assessment & Plan Note (Signed)
Moderate severe stenosis by last echo.  Was due to have one checked in the fall.  We will reschedule for her now.  Thankfully, not having any active symptoms.  We discussed concerning symptoms of anginal type chest pain, heart failure symptoms of PND, orthopnea, edema or exertional dyspnea.  Also more concerned we discussed syncope.

## 2019-03-08 NOTE — Assessment & Plan Note (Signed)
We spent probably 5 to 7 minutes talking about her concerns with Covid vaccine and and risks versus whether or not she should get it because of her self-isolation.   I explained to her that the significant benefit of protection from sedation versus the risks of adverse events from the vaccine.  Being protected with vaccine would allow her to feel safer becoming more active part of her previous social life.

## 2019-03-08 NOTE — Assessment & Plan Note (Signed)
Reevaluate on current echo.  Does not have a lot of edema or orthopnea.  Manage systemic blood pressure.

## 2019-03-08 NOTE — Assessment & Plan Note (Signed)
Nonischemic/nonobstructive coronary disease by cardiac cath in 2017. Maintain adequate glycemic control per PCP.  Lipids are well controlled.  We need to improve blood pressure control.  Monitor for signs of angina.  Probably would not require ischemic evaluation until such time as her aortic valve becomes severe

## 2019-03-08 NOTE — Assessment & Plan Note (Addendum)
Blood pressure is again elevated today.  I will have her increase her ARB dose to 80 mg daily.  She should be having labs checked soon and we can reevaluate her renal functions.  With a heart rate of 63 bpm, probably would not be to increase carvedilol any further.  Next option for blood pressure control will be to add a calcium channel blocker such as amlodipine since she is already on adequate dose of carvedilol chlorthalidone.

## 2019-03-08 NOTE — Assessment & Plan Note (Signed)
Had excellent lipids back in February 2020.  Is on high-dose atorvastatin.  I suspect that if her lipids look is good this year that we can probably reduce her dose to 40 mg.

## 2019-03-08 NOTE — Assessment & Plan Note (Signed)
The patient understands the need to lose weight with diet and exercise. We have discussed specific strategies for this.  

## 2019-03-10 HISTORY — PX: TRANSTHORACIC ECHOCARDIOGRAM: SHX275

## 2019-03-17 ENCOUNTER — Other Ambulatory Visit: Payer: Self-pay

## 2019-03-17 ENCOUNTER — Ambulatory Visit (HOSPITAL_COMMUNITY): Payer: Medicare Other | Attending: Cardiovascular Disease

## 2019-03-17 DIAGNOSIS — I350 Nonrheumatic aortic (valve) stenosis: Secondary | ICD-10-CM | POA: Diagnosis not present

## 2019-03-17 DIAGNOSIS — I2510 Atherosclerotic heart disease of native coronary artery without angina pectoris: Secondary | ICD-10-CM | POA: Diagnosis not present

## 2019-03-17 DIAGNOSIS — I5032 Chronic diastolic (congestive) heart failure: Secondary | ICD-10-CM | POA: Diagnosis not present

## 2019-03-17 HISTORY — PX: TRANSTHORACIC ECHOCARDIOGRAM: SHX275

## 2019-03-19 ENCOUNTER — Other Ambulatory Visit: Payer: Self-pay

## 2019-03-19 ENCOUNTER — Ambulatory Visit (INDEPENDENT_AMBULATORY_CARE_PROVIDER_SITE_OTHER): Payer: Medicare Other | Admitting: Internal Medicine

## 2019-03-19 ENCOUNTER — Encounter: Payer: Self-pay | Admitting: Internal Medicine

## 2019-03-19 ENCOUNTER — Other Ambulatory Visit: Payer: Self-pay | Admitting: Internal Medicine

## 2019-03-19 VITALS — BP 144/82 | HR 61 | Temp 97.9°F | Ht 61.0 in | Wt 212.0 lb

## 2019-03-19 DIAGNOSIS — E785 Hyperlipidemia, unspecified: Secondary | ICD-10-CM

## 2019-03-19 DIAGNOSIS — E611 Iron deficiency: Secondary | ICD-10-CM

## 2019-03-19 DIAGNOSIS — I10 Essential (primary) hypertension: Secondary | ICD-10-CM

## 2019-03-19 DIAGNOSIS — Z0000 Encounter for general adult medical examination without abnormal findings: Secondary | ICD-10-CM | POA: Diagnosis not present

## 2019-03-19 DIAGNOSIS — E538 Deficiency of other specified B group vitamins: Secondary | ICD-10-CM

## 2019-03-19 DIAGNOSIS — Z0001 Encounter for general adult medical examination with abnormal findings: Secondary | ICD-10-CM

## 2019-03-19 DIAGNOSIS — E559 Vitamin D deficiency, unspecified: Secondary | ICD-10-CM

## 2019-03-19 DIAGNOSIS — F329 Major depressive disorder, single episode, unspecified: Secondary | ICD-10-CM

## 2019-03-19 DIAGNOSIS — I350 Nonrheumatic aortic (valve) stenosis: Secondary | ICD-10-CM

## 2019-03-19 DIAGNOSIS — E119 Type 2 diabetes mellitus without complications: Secondary | ICD-10-CM

## 2019-03-19 DIAGNOSIS — F32A Depression, unspecified: Secondary | ICD-10-CM

## 2019-03-19 LAB — URINALYSIS, ROUTINE W REFLEX MICROSCOPIC
Bilirubin Urine: NEGATIVE
Hgb urine dipstick: NEGATIVE
Ketones, ur: NEGATIVE
Leukocytes,Ua: NEGATIVE
Nitrite: NEGATIVE
RBC / HPF: NONE SEEN (ref 0–?)
Specific Gravity, Urine: 1.02 (ref 1.000–1.030)
Total Protein, Urine: NEGATIVE
Urine Glucose: NEGATIVE
Urobilinogen, UA: 0.2 (ref 0.0–1.0)
pH: 6 (ref 5.0–8.0)

## 2019-03-19 LAB — CBC WITH DIFFERENTIAL/PLATELET
Basophils Absolute: 0 10*3/uL (ref 0.0–0.1)
Basophils Relative: 0.2 % (ref 0.0–3.0)
Eosinophils Absolute: 0.1 10*3/uL (ref 0.0–0.7)
Eosinophils Relative: 2.9 % (ref 0.0–5.0)
HCT: 34.9 % — ABNORMAL LOW (ref 36.0–46.0)
Hemoglobin: 11.7 g/dL — ABNORMAL LOW (ref 12.0–15.0)
Lymphocytes Relative: 33.3 % (ref 12.0–46.0)
Lymphs Abs: 1.5 10*3/uL (ref 0.7–4.0)
MCHC: 33.6 g/dL (ref 30.0–36.0)
MCV: 96.4 fl (ref 78.0–100.0)
Monocytes Absolute: 0.4 10*3/uL (ref 0.1–1.0)
Monocytes Relative: 8.1 % (ref 3.0–12.0)
Neutro Abs: 2.6 10*3/uL (ref 1.4–7.7)
Neutrophils Relative %: 55.5 % (ref 43.0–77.0)
Platelets: 163 10*3/uL (ref 150.0–400.0)
RBC: 3.62 Mil/uL — ABNORMAL LOW (ref 3.87–5.11)
RDW: 13.2 % (ref 11.5–15.5)
WBC: 4.7 10*3/uL (ref 4.0–10.5)

## 2019-03-19 LAB — HEPATIC FUNCTION PANEL
ALT: 18 U/L (ref 0–35)
AST: 15 U/L (ref 0–37)
Albumin: 3.7 g/dL (ref 3.5–5.2)
Alkaline Phosphatase: 41 U/L (ref 39–117)
Bilirubin, Direct: 0.2 mg/dL (ref 0.0–0.3)
Total Bilirubin: 1 mg/dL (ref 0.2–1.2)
Total Protein: 6.5 g/dL (ref 6.0–8.3)

## 2019-03-19 LAB — MICROALBUMIN / CREATININE URINE RATIO
Creatinine,U: 117.7 mg/dL
Microalb Creat Ratio: 0.9 mg/g (ref 0.0–30.0)
Microalb, Ur: 1.1 mg/dL (ref 0.0–1.9)

## 2019-03-19 LAB — IBC PANEL
Iron: 84 ug/dL (ref 42–145)
Saturation Ratios: 26.1 % (ref 20.0–50.0)
Transferrin: 230 mg/dL (ref 212.0–360.0)

## 2019-03-19 LAB — BASIC METABOLIC PANEL
BUN: 22 mg/dL (ref 6–23)
CO2: 30 mEq/L (ref 19–32)
Calcium: 9.7 mg/dL (ref 8.4–10.5)
Chloride: 105 mEq/L (ref 96–112)
Creatinine, Ser: 0.96 mg/dL (ref 0.40–1.20)
GFR: 55.86 mL/min — ABNORMAL LOW (ref >60.00)
Glucose, Bld: 162 mg/dL — ABNORMAL HIGH (ref 70–99)
Potassium: 4.2 mEq/L (ref 3.5–5.1)
Sodium: 139 mEq/L (ref 135–145)

## 2019-03-19 LAB — LIPID PANEL
Cholesterol: 83 mg/dL (ref 0–200)
HDL: 36.1 mg/dL — ABNORMAL LOW (ref >39.00)
LDL Cholesterol: 35 mg/dL (ref 0–99)
NonHDL: 46.55
Total CHOL/HDL Ratio: 2
Triglycerides: 59 mg/dL (ref 0.0–149.0)
VLDL: 11.8 mg/dL (ref 0.0–40.0)

## 2019-03-19 LAB — TSH: TSH: 5.3 u[IU]/mL — ABNORMAL HIGH (ref 0.35–4.50)

## 2019-03-19 LAB — HEMOGLOBIN A1C: Hgb A1c MFr Bld: 7.5 % — ABNORMAL HIGH (ref 4.6–6.5)

## 2019-03-19 LAB — VITAMIN B12: Vitamin B-12: 167 pg/mL — ABNORMAL LOW (ref 211–911)

## 2019-03-19 LAB — VITAMIN D 25 HYDROXY (VIT D DEFICIENCY, FRACTURES): VITD: <7 ng/mL — ABNORMAL LOW (ref 30.00–100.00)

## 2019-03-19 MED ORDER — VITAMIN D (ERGOCALCIFEROL) 1.25 MG (50000 UNIT) PO CAPS: 50000.0000 [IU] | ORAL_CAPSULE | ORAL | 0 refills | Status: DC

## 2019-03-19 MED ORDER — VITAMIN B-12 1000 MCG PO TABS: 1000.0000 ug | ORAL_TABLET | Freq: Every day | ORAL | 1 refills | Status: DC

## 2019-03-19 NOTE — Assessment & Plan Note (Signed)
stable overall by history and exam, recent data reviewed with pt, and pt to continue medical treatment as before,  to f/u any worsening symptoms or concerns  

## 2019-03-19 NOTE — Assessment & Plan Note (Signed)

## 2019-03-19 NOTE — Assessment & Plan Note (Addendum)
Declines tx for now or counseling referral  I spent 20 minutes preparing to see the patient by review of recent labs, imaging and procedures, obtaining and reviewing separately obtained history, communicating with the patient and family or caregiver, ordering medications, tests or procedures, and documenting clinical information in the EHR including the differential Dx, treatment, and any further evaluation and other management of depression, DM, HTN, HLD

## 2019-03-19 NOTE — Progress Notes (Signed)
Subjective:    Patient ID: Mary Mccarthy, female    DOB: [birth year], 79 y.o.   MRN: [id number]  HPI Here for wellness and f/u;  Overall doing ok;  Pt denies Chest pain, worsening SOB, DOE, wheezing, orthopnea, PND, worsening LE edema, palpitations, dizziness or syncope.  Pt denies neurological change such as new headache, facial or extremity weakness.  Pt denies polydipsia, polyuria, or low sugar symptoms. Pt states overall good compliance with treatment and medications, good tolerability, and has been trying to follow appropriate diet.  Pt denies worsening depressive symptoms, suicidal ideation or panic. No fever, night sweats, wt loss, loss of appetite, or other constitutional symptoms.  Pt states good ability with ADL's, has low fall risk, home safety reviewed and adequate, no other significant changes in hearing or vision, and only occasionally active with exercise.  Hs been depressed and grieving for husband who died in January 02, 2019 with cancer. No SI or HI    Past Medical History:  Diagnosis Date  . ALLERGIC RHINITIS 06/24/2007  . Anemia   . AVM (arteriovenous malformation) of colon   . Blood transfusion without reported diagnosis 05/2015  . COLONIC POLYPS, HX OF 06/24/2007  . Coronary artery disease, non-occlusive 05/2015   Positive troponin in setting of acute anemia.  Cardiac cath showed ostial ramus 60%, ostial RCA 30% and distal LAD 40 and 50%.  . DEPRESSION 10/08/2006  . DIABETES MELLITUS, TYPE II 10/05/2006  . GERD 06/24/2007  . HYPERLIPIDEMIA 10/08/2006  . HYPERTENSION 10/08/2006  . INSOMNIA 09/24/2008  . Left knee DJD   . Moderate to severe aortic stenosis 05/2015   a) Moderate aortic stenosis (mean gradient 22 mmHg, peak 42 mmHg -AVA 1.1-1.2 cm.).;;b) 04/2017: Severe calcified aortic valve with moderate-severe aortic stenosis (peak-mean gradient 60-34 mmHg -moderate), calculated AVA 0.8 cm -severe..  . Myocardial infarction ([place]) 05/2015   Type 2 MI - related to GI Bleed / Anemia  (non-obstructive CAD by Cath)  . Osteoporosis 03/10/2016  . PEPTIC ULCER DISEASE 10/08/2006   Past Surgical History:  Procedure Laterality Date  . APPENDECTOMY    . BREAST BIOPSY    . CARDIAC CATHETERIZATION N/A 05/21/2015   Procedure: Left Heart Cath and Coronary Angiography;  Surgeon: Leonie Man, MD;  Location: [facility];  Service: Cardiovascular;: Ost RI 60%, Ost RCA 30%, dLAD tapers to small vessel w/ 40-50%. Mildly elevated LVEDP. Normal LV Fxn.  . COLONOSCOPY N/A 05/20/2015   Procedure: COLONOSCOPY;  Surgeon: Manus Gunning, MD;  Location: [facility];  Service: Gastroenterology;  Laterality: N/A;  . ESOPHAGOGASTRODUODENOSCOPY N/A 05/20/2015   Procedure: ESOPHAGOGASTRODUODENOSCOPY (EGD);  Surgeon: Manus Gunning, MD;  Location: [facility];  Service: Gastroenterology;  Laterality: N/A;  . TONSILLECTOMY    . TRANSTHORACIC ECHOCARDIOGRAM  10/'15; 4/'17   a) mild AS, mildly elevated PA pressures.;; b)  Moderate LVH.  EF 55-60%.  Moderate aortic stenosis (mean gradient 22 mmHg, peak 42 mmHg -AVA 1.1-1.2 cm.).  Severe LA dilation.  Moderate TR with RVSP peak 51 mmHg and dilated IVC  . TRANSTHORACIC ECHOCARDIOGRAM  04/12/2017   EF 60-65%. No RWMA.  GR 1 DD.  Moderate concentric LVH.  Mild LA dilation. Severe calcified aortic valve with moderate-severe aortic stenosis (peak/mean gradients 60/34 mmHg) and in severe range by AVA (0.8 cm2).  Mild to moderately increased PA pressures 41 mmHg with normal RV size and function..    . TUBAL LIGATION      reports that she has never smoked. She has  never used smokeless tobacco. She reports that she does not drink alcohol or use drugs. family history includes Alcohol abuse in an other family member; Arrhythmia in her sister; Arthritis in an other family member; CAD in her sister; Cancer in her mother; Depression in an other family member; Diabetes in an other family member; Heart attack (age of onset: 58) in her sister; Heart  disease in an other family member; Hyperlipidemia in an other family member; Hypertension in an other family member; Stroke in an other family member. Allergies  Allergen Reactions  . Ibuprofen Other (See Comments)    Bleeding events   Current Outpatient Medications on File Prior to Visit  Medication Sig Dispense Refill  . acetaminophen (TYLENOL) 500 MG tablet Take 500 mg by mouth every 6 (six) hours as needed for moderate pain.    Marland Kitchen atorvastatin (LIPITOR) 80 MG tablet TAKE 1 TABLET (80 MG TOTAL) BY MOUTH DAILY AT 6 PM. 90 tablet 1  . blood glucose meter kit and supplies Dispense based on patient and insurance preference. Use up to four times daily as directed. E11.9 1 each 0  . Blood Glucose Monitoring Suppl (ONE TOUCH ULTRA 2) w/Device KIT Use as directed daily 1 each 0  . carvedilol (COREG) 6.25 MG tablet Take 1 tablet (6.25 mg total) by mouth 2 (two) times daily. 180 tablet 1  . chlorthalidone (HYGROTON) 25 MG tablet Take 1 tablet (25 mg total) by mouth daily. 90 tablet 1  . clonazePAM (KLONOPIN) 0.5 MG tablet TAKE 1 TABLET BY MOUTH TWICE A DAY AS NEEDED FOR ANXIETY 60 tablet 2  . glimepiride (AMARYL) 2 MG tablet TAKE 1 TABLET (2 MG TOTAL) BY MOUTH DAILY BEFORE BREAKFAST. 90 tablet 1  . glucose blood (ONE TOUCH ULTRA TEST) test strip Use as instructed Test 1-2 times a day 200 each 11  . metFORMIN (GLUCOPHAGE) 1000 MG tablet TAKE 1 TAB BY MOUTH IN THE AM,AND 1/2 TAB IN THE PM. 135 tablet 3  . ONETOUCH DELICA LANCETS 54U MISC use as directed test 1-2 times a day 200 each 11  . pantoprazole (PROTONIX) 40 MG tablet TAKE 1 TABLET BY MOUTH EVERY DAY 90 tablet 3  . telmisartan (MICARDIS) 80 MG tablet Take 1 tablet (80 mg total) by mouth daily. 90 tablet 3  . vitamin C (VITAMIN C) 500 MG tablet Take 1 tablet (500 mg total) by mouth 2 (two) times daily. 60 tablet 0   No current facility-administered medications on file prior to visit.   Review of Systems All otherwise neg per pt      Objective:   Physical Exam BP (!) 144/82   Pulse 61   Temp 97.9 F (36.6 C)   Ht 5' 1" (1.549 m)   Wt 212 lb (96.2 kg)   SpO2 98%   BMI 40.06 kg/m  VS noted,  Constitutional: Pt appears in NAD HENT: Head: NCAT.  Right Ear: External ear normal.  Left Ear: External ear normal.  Eyes: . Pupils are equal, round, and reactive to light. Conjunctivae and EOM are normal Nose: without d/c or deformity Neck: Neck supple. Gross normal ROM Cardiovascular: Normal rate and regular rhythm.   Pulmonary/Chest: Effort normal and breath sounds without rales or wheezing.  Abd:  Soft, NT, ND, + BS, no organomegaly Neurological: Pt is alert. At baseline orientation, motor grossly intact Skin: Skin is warm. No rashes, other new lesions, no LE edema Psychiatric: Pt behavior is normal without agitation, depressed affect and mood  All otherwise  neg per pt Lab Results  Component Value Date   WBC 4.6 03/14/2018   HGB 12.4 03/14/2018   HCT 35.9 (L) 03/14/2018   PLT 165.0 03/14/2018   GLUCOSE 113 (H) 03/14/2018   CHOL 68 03/14/2018   TRIG 91.0 03/14/2018   HDL 20.90 (L) 03/14/2018   LDLCALC 29 03/14/2018   ALT 17 03/14/2018   AST 20 03/14/2018   NA 138 03/14/2018   K 4.2 03/14/2018   CL 103 03/14/2018   CREATININE 0.93 03/14/2018   BUN 22 03/14/2018   CO2 30 03/14/2018   TSH 4.21 03/14/2018   INR 1.18 05/21/2015   HGBA1C 6.6 (A) 09/13/2018   MICROALBUR 1.7 03/14/2017      Assessment & Plan:

## 2019-03-19 NOTE — Patient Instructions (Signed)
Please continue all other medications as before, and refills have been done if requested.  Please have the pharmacy call with any other refills you may need.  Please continue your efforts at being more active, low cholesterol diet, and weight control.  You are otherwise up to date with prevention measures today.  Please keep your appointments with your specialists as you may have planned  Please go to the LAB at the blood drawing area for the tests to be done  You will be contacted by phone if any changes need to be made immediately.  Otherwise, you will receive a letter about your results with an explanation, but please check with MyChart first.  Please remember to sign up for MyChart if you have not done so, as this will be important to you in the future with finding out test results, communicating by private email, and scheduling acute appointments online when needed.  Please make an Appointment to return in 6 months, or sooner if needed, also with Lab Appointment for testing done 3-5 days before at the FIRST FLOOR Lab (so this is for TWO appointments - please see the scheduling desk as you leave)  

## 2019-03-19 NOTE — Assessment & Plan Note (Signed)
Per echo, to f/u cards as planned

## 2019-03-19 NOTE — Addendum Note (Signed)
Addended by: Corwin Levins on: 03/19/2019 01:00 PM   Modules accepted: Orders

## 2019-04-09 ENCOUNTER — Other Ambulatory Visit: Payer: Self-pay

## 2019-04-09 MED ORDER — GLIMEPIRIDE 2 MG PO TABS: 2.0000 mg | ORAL_TABLET | Freq: Every day | ORAL | 1 refills | Status: DC

## 2019-04-24 ENCOUNTER — Ambulatory Visit: Payer: Medicare Other

## 2019-04-28 ENCOUNTER — Telehealth: Payer: Self-pay

## 2019-04-28 NOTE — Telephone Encounter (Signed)
Pt. Scheduled for first COVID 19 vaccination and asking if she can taker regular prescriptions that she usually takes. Instructed pt. That she could.

## 2019-05-01 ENCOUNTER — Ambulatory Visit: Payer: Medicare Other | Attending: Internal Medicine

## 2019-05-01 DIAGNOSIS — Z23 Encounter for immunization: Secondary | ICD-10-CM

## 2019-05-01 NOTE — Progress Notes (Signed)
   Covid-19 Vaccination Clinic  Name:  Mary Mccarthy    MRN: [id number] DOB: 1940-07-22  05/01/2019  [name] was observed post Covid-19 immunization for 15 minutes without incident. She was provided with Vaccine Information Sheet and instruction to access the V-Safe system.   Ms. Call was instructed to call 911 with any severe reactions post vaccine: Marland Kitchen Difficulty breathing  . Swelling of face and throat  . A fast heartbeat  . A bad rash all over body  . Dizziness and weakness   Immunizations Administered    Name Date Dose VIS Date Route   Pfizer COVID-19 Vaccine 05/01/2019  9:57 AM 0.3 mL 01/17/2019 Intramuscular   Manufacturer: [employer], [employer]   Lot: [id number]   NDC: [address]-2

## 2019-05-26 ENCOUNTER — Ambulatory Visit: Payer: Medicare Other | Attending: Internal Medicine

## 2019-05-26 DIAGNOSIS — Z23 Encounter for immunization: Secondary | ICD-10-CM

## 2019-05-26 NOTE — Progress Notes (Signed)
   Covid-19 Vaccination Clinic  Name:  Mary Mccarthy    MRN: [id number] DOB: 06/11/1940  05/26/2019  [name] was observed post Covid-19 immunization for 15 minutes without incident. She was provided with Vaccine Information Sheet and instruction to access the V-Safe system.   [name] was instructed to call 911 with any severe reactions post vaccine: Marland Kitchen Difficulty breathing  . Swelling of face and throat  . A fast heartbeat  . A bad rash all over body  . Dizziness and weakness   Immunizations Administered    Name Date Dose VIS Date Route   Pfizer COVID-19 Vaccine 05/26/2019  9:37 AM 0.3 mL 04/02/2018 Intramuscular   Manufacturer: [employer], [employer]   Lot: [id number]   NDC: [address]-3

## 2019-06-03 ENCOUNTER — Other Ambulatory Visit: Payer: Self-pay | Admitting: Internal Medicine

## 2019-06-03 NOTE — Telephone Encounter (Signed)
Done erx 

## 2019-06-14 ENCOUNTER — Other Ambulatory Visit: Payer: Self-pay | Admitting: Internal Medicine

## 2019-06-14 NOTE — Telephone Encounter (Signed)
Please change to OTC Vitamin D3 at 2000 units per day, indefinitely.  

## 2019-07-28 ENCOUNTER — Other Ambulatory Visit: Payer: Self-pay | Admitting: Internal Medicine

## 2019-08-17 ENCOUNTER — Other Ambulatory Visit: Payer: Self-pay | Admitting: Cardiology

## 2019-08-21 ENCOUNTER — Encounter: Payer: Self-pay | Admitting: Cardiology

## 2019-08-21 ENCOUNTER — Ambulatory Visit: Payer: Medicare Other | Admitting: Cardiology

## 2019-08-21 ENCOUNTER — Other Ambulatory Visit: Payer: Self-pay

## 2019-08-21 VITALS — BP 154/72 | HR 71 | Temp 96.9°F | Ht 61.0 in | Wt 217.0 lb

## 2019-08-21 DIAGNOSIS — I10 Essential (primary) hypertension: Secondary | ICD-10-CM | POA: Diagnosis not present

## 2019-08-21 DIAGNOSIS — I2510 Atherosclerotic heart disease of native coronary artery without angina pectoris: Secondary | ICD-10-CM

## 2019-08-21 DIAGNOSIS — I350 Nonrheumatic aortic (valve) stenosis: Secondary | ICD-10-CM

## 2019-08-21 DIAGNOSIS — E785 Hyperlipidemia, unspecified: Secondary | ICD-10-CM | POA: Diagnosis not present

## 2019-08-21 DIAGNOSIS — I5032 Chronic diastolic (congestive) heart failure: Secondary | ICD-10-CM

## 2019-08-21 MED ORDER — ATORVASTATIN CALCIUM 40 MG PO TABS: 40.0000 mg | ORAL_TABLET | Freq: Every day | ORAL | 3 refills | Status: DC

## 2019-08-21 NOTE — Patient Instructions (Signed)
Medication Instructions:   Decrease atorvastatin to 40 mg daily   new prescription sent to pharmacy    *If you need a refill on your cardiac medications before your next appointment, please call your pharmacy*   Lab Work: Not needed    Testing/Procedures: Will be schedule at [facility] [address] Your physician has requested that you have an echocardiogram. Echocardiography is a painless test that uses sound waves to create images of your heart. It provides your doctor with information about the size and shape of your heart and how well your heart's chambers and valves are working. This procedure takes approximately one hour. There are no restrictions for this procedure.     Follow-Up: At [facility], you and your health needs are our priority.  As part of our continuing mission to provide you with exceptional heart care, we have created designated Provider Care Teams.  These Care Teams include your primary Cardiologist (physician) and Advanced Practice Providers (APPs -  Physician Assistants and Nurse Practitioners) who all work together to provide you with the care you need, when you need it.  We recommend signing up for the patient portal called "MyChart".  Sign up information is provided on this After Visit Summary.  MyChart is used to connect with patients for Virtual Visits (Telemedicine).  Patients are able to view lab/test results, encounter notes, upcoming appointments, etc.  Non-urgent messages can be sent to your provider as well.   To learn more about what you can do with MyChart, go to [email or web address].    Your next appointment:   8 month(s)- FEb 2022  The format for your next appointment:   In Person  Provider:   Bryan Lemma, MD

## 2019-08-21 NOTE — Progress Notes (Signed)
.  Primary Care Provider: Biagio Borg, MD Cardiologist: Glenetta Hew, MD Electrophysiologist: None  Clinic Note: Chief Complaint  Patient presents with  . Follow-up    84-month . Aortic Stenosis    Review of recent echo  . Hypertension    HPI:    Mary Pattyis a 79y.o. female with a PMH below who presents today for 630-monthollow-up for moderate to severe aortic stenosis and hypertension, and pulmonary hypertension with morbid obesity with prior history of SVT.  Being followed at the request of Dr. JaCathlean Cower Her family history is notable for the fact that her sister (GVickki Muffis a well-known patient to our practice who was a patient of Dr. CrSallyanne Kuster.  She had significant CAD and arrhythmia issues - died No2019-12-26 Cardiac cath in April 2017 showed mild-moderate nonobstructive CAD with moderate aortic stenosis on echo.  Mary Shoneas last seen on March 08, 2019-> blood pressure again noted to be high.  No cardiac symptoms.  Had not been active [place] Was quite upset troubled by how things happened.  Very careful.  Recent Hospitalizations: None  Reviewed  CV studies:    The following studies were reviewed today: (if available, images/films reviewed: From Epic Chart or Care Everywhere) . Echocardiogram 03/17/2019: EF 60 to 65%.  Moderate LVH.  GRII DD.  Moderate-severe aortic stenosis-mean gradient 36 mmHg, peak 59 mmHg   Interval History:   Mary Shoneeturns for follow-up doing better.  She is a little more stable now recuperating from her husband's death.  She says that her 2 nephews checking on her routinely.  Her niece also calls and checks on her.  Her son who lives nearby calls at least twice a day and then is usually there with her at night.  She has now started getting out doing some walking but not as much as she probably should.  She has very mild baseline dyspnea with exertion secondary to being  deconditioned.  She has put on some weight and has musculoskeletal issues as well. She really does not feel comfortable walking around outside unless her son is there with her.  She does walk on the treadmill at home when he is there as well, but is scared that she may fall.  She has been having some memory issues and balance issues.  She had 2 spells where she felt her heart rate got up really fast they were both were associated with her being scared and frightened by something--1 episode was when there was a pack of wild dogs outside the house parking and she was scared to go outside.  Otherwise she has not noted any real rapid irregular heartbeats or palpitations.  No syncope or near syncope, chest pain or pressure with rest or exertion.  Only exertional dyspnea from deconditioning.  CV Review of Symptoms (Summary): negative positive for - dyspnea on exertion, rapid heart rate and As noted above negative for - chest pain, edema, irregular heartbeat, loss of consciousness, orthopnea, palpitations, paroxysmal nocturnal dyspnea, rapid heart rate, shortness of breath or Lightheadedness, wooziness or dizziness, near syncope.  TIA/amaurosis fugax  The patient DOES NOT have symptoms concerning for COVID-19 infection (fever, chills, cough, or new shortness of breath).  The patient is practicing social distancing & Masking.   ++ Covid Vaccine    REVIEWED OF SYSTEMS   A comprehensive ROS was performed. Review of Systems  Constitutional: Negative for malaise/fatigue  and weight loss (Has gained some weight).  HENT: Negative for congestion and nosebleeds.   Respiratory: Negative for cough and wheezing.   Gastrointestinal: Negative for blood in stool and melena.  Genitourinary: Negative for hematuria.  Musculoskeletal: Positive for joint pain (Mild arthritis pains.). Negative for falls (But she is scared of falling).  Neurological: Positive for dizziness (Has unsteady gait and some balance issues.).  Negative for focal weakness and weakness.  Psychiatric/Behavioral: Positive for memory loss. The patient has insomnia (Improving now.  The further she gets up from the trauma of her husband's death). The patient is not nervous/anxious.        Still having issues after her husband's death.  Very upsetting to her some the comments he made prior to his final days.  She is upset that of their last few months together, they argued more than usual and things were difficult.  All other systems reviewed and are negative.   I have reviewed and (if needed) personally updated the patient's problem list, medications, allergies, past medical and surgical history, social and family history.   PAST MEDICAL HISTORY   Past Medical History:  Diagnosis Date  . ALLERGIC RHINITIS 06/24/2007  . Anemia   . AVM (arteriovenous malformation) of colon   . Blood transfusion without reported diagnosis 05/2015  . COLONIC POLYPS, HX OF 06/24/2007  . Coronary artery disease, non-occlusive 05/2015   Positive troponin in setting of acute anemia.  Cardiac cath showed ostial ramus 60%, ostial RCA 30% and distal LAD 40 and 50%.  . DEPRESSION 10/08/2006  . DIABETES MELLITUS, TYPE II 10/05/2006  . GERD 06/24/2007  . HYPERLIPIDEMIA 10/08/2006  . HYPERTENSION 10/08/2006  . INSOMNIA 09/24/2008  . Left knee DJD   . Moderate to severe aortic stenosis 05/2015   a) Moderate aortic stenosis (mean gradient 22 mmHg, peak 42 mmHg -AVA 1.1-1.2 cm.).;;b) 04/2017: Severe calcified aortic valve with moderate-severe aortic stenosis (peak-mean gradient 60-34 mmHg -moderate), calculated AVA 0.8 cm -severe..  . Myocardial infarction ([place]) 05/2015   Type 2 MI - related to GI Bleed / Anemia (non-obstructive CAD by Cath)  . Osteoporosis 03/10/2016  . PEPTIC ULCER DISEASE 10/08/2006    PAST SURGICAL HISTORY   Past Surgical History:  Procedure Laterality Date  . APPENDECTOMY    . BREAST BIOPSY    . CARDIAC CATHETERIZATION N/A 05/21/2015   Procedure:  Left Heart Cath and Coronary Angiography;  Surgeon: Leonie Man, MD;  Location: [facility];  Service: Cardiovascular;: Ost RI 60%, Ost RCA 30%, dLAD tapers to small vessel w/ 40-50%. Mildly elevated LVEDP. Normal LV Fxn.  . COLONOSCOPY N/A 05/20/2015   Procedure: COLONOSCOPY;  Surgeon: Manus Gunning, MD;  Location: [facility];  Service: Gastroenterology;  Laterality: N/A;  . ESOPHAGOGASTRODUODENOSCOPY N/A 05/20/2015   Procedure: ESOPHAGOGASTRODUODENOSCOPY (EGD);  Surgeon: Manus Gunning, MD;  Location: [facility];  Service: Gastroenterology;  Laterality: N/A;  . TONSILLECTOMY    . TRANSTHORACIC ECHOCARDIOGRAM  10/'15; 4/'17   a) mild AS, mildly elevated PA pressures.;; b)  Moderate LVH.  EF 55-60%.  Moderate aortic stenosis (mean gradient 22 mmHg, peak 42 mmHg -AVA 1.1-1.2 cm.).  Severe LA dilation.  Moderate TR with RVSP peak 51 mmHg and dilated IVC  . TRANSTHORACIC ECHOCARDIOGRAM  04/12/2017   EF 60-65%. No RWMA.  GR 1 DD.  Moderate concentric LVH.  Mild LA dilation. Severe calcified aortic valve with moderate-severe aortic stenosis (peak/mean gradients 60/34 mmHg) and in severe range by AVA (0.8 cm2).  Mild to moderately increased PA pressures 41 mmHg with normal RV size and function..    . TUBAL LIGATION      MEDICATIONS/ALLERGIES   Current Meds  Medication Sig  . acetaminophen (TYLENOL) 500 MG tablet Take 500 mg by mouth every 6 (six) hours as needed for moderate pain.  . blood glucose meter kit and supplies Dispense based on patient and insurance preference. Use up to four times daily as directed. E11.9  . Blood Glucose Monitoring Suppl (ONE TOUCH ULTRA 2) w/Device KIT Use as directed daily  . carvedilol (COREG) 6.25 MG tablet Take 1 tablet (6.25 mg total) by mouth 2 (two) times daily.  . chlorthalidone (HYGROTON) 25 MG tablet TAKE 1 TABLET BY MOUTH EVERY DAY  . clonazePAM (KLONOPIN) 0.5 MG tablet TAKE 1 TABLET BY MOUTH TWICE A DAY AS NEEDED FOR ANXIETY  .  glimepiride (AMARYL) 2 MG tablet Take 1 tablet (2 mg total) by mouth daily before breakfast.  . Lancets (ONETOUCH DELICA PLUS [id number]) MISC USE AS DIRECTED TEST 1-2 TIMES A DAY  . metFORMIN (GLUCOPHAGE) 1000 MG tablet TAKE 1 TAB BY MOUTH IN THE AM,AND 1/2 TAB IN THE PM.  . ONETOUCH ULTRA test strip USE AS INSTRUCTED TEST 1-2 TIMES A DAY  . pantoprazole (PROTONIX) 40 MG tablet TAKE 1 TABLET BY MOUTH EVERY DAY  . telmisartan (MICARDIS) 80 MG tablet Take 1 tablet (80 mg total) by mouth daily.  . vitamin B-12 (CYANOCOBALAMIN) 1000 MCG tablet Take 1 tablet (1,000 mcg total) by mouth daily.  . vitamin C (VITAMIN C) 500 MG tablet Take 1 tablet (500 mg total) by mouth 2 (two) times daily.  . Vitamin D, Ergocalciferol, (DRISDOL) 1.25 MG (50000 UNIT) CAPS capsule Take 1 capsule (50,000 Units total) by mouth every 7 (seven) days.  . [DISCONTINUED] atorvastatin (LIPITOR) 80 MG tablet TAKE 1 TABLET (80 MG TOTAL) BY MOUTH DAILY AT 6 PM.    Allergies  Allergen Reactions  . Ibuprofen Other (See Comments)    Bleeding events    SOCIAL HISTORY/FAMILY HISTORY   Social History   Tobacco Use  . Smoking status: Never Smoker  . Smokeless tobacco: Never Used  Substance Use Topics  . Alcohol use: No    Alcohol/week: 0.0 standard drinks  . Drug use: No   Social History   Social History Narrative   Recently widowed--husband died in 11-18-18.      Now lives alone but her son usually stays with her at night.  During the day she has 2 nephews who live nearby to come in and check on her intermittently.  Also one of her nieces calls routinely.      When her son is home and awake, she may try to walk on a treadmill, but is scared to walk outside.    Family History family history includes Alcohol abuse in an other family member; Arrhythmia in her sister; Arthritis in an other family member; CAD in her sister; Cancer in her mother; Depression in an other family member; Diabetes in an other family member;  Heart attack (age of onset: 33) in her sister; Heart disease in an other family member; Hyperlipidemia in an other family member; Hypertension in an other family member; Stroke in an other family member.  OBJCTIVE -PE, EKG, labs   Wt Readings from Last 3 Encounters:  08/21/19 217 lb (98.4 kg)  03/19/19 212 lb (96.2 kg)  03/06/19 213 lb 3.2 oz (96.7 kg)    Physical Exam: BP (!) 154/72  Pulse 71   Temp (!) 96.9 F (36.1 C)   Ht '5\' 1"'$  (1.549 m)   Wt 217 lb (98.4 kg)   SpO2 95%   BMI 41.00 kg/m  Physical Exam Vitals reviewed.  Constitutional:      General: She is not in acute distress.    Appearance: She is well-developed.     Comments: Morbidly obese woman.  Truncal obesity.  Well-groomed  HENT:     Head: Normocephalic and atraumatic.  Neck:     Vascular: No carotid bruit (Radiated aortic murmur), hepatojugular reflux or JVD.  Cardiovascular:     Rate and Rhythm: Normal rate and regular rhythm.  No extrasystoles are present.    Chest Wall: PMI is not displaced (Difficult to palpate).     Pulses: Decreased pulses (Because of body habitus.).     Heart sounds: S1 normal and S2 normal. Heart sounds are distant. Murmur heard.  Medium-pitched harsh crescendo-decrescendo mid to late systolic murmur is present with a grade of 3/6 at the upper right sternal border radiating to the neck.  No friction rub. No gallop.   Pulmonary:     Effort: Pulmonary effort is normal. No respiratory distress.     Breath sounds: Normal breath sounds. No wheezing or rales.  Abdominal:     General: Bowel sounds are normal. There is no distension.     Palpations: Abdomen is soft.     Tenderness: There is no abdominal tenderness. There is no rebound.     Comments: Truncal obesity.  Unable assess HSM.  Musculoskeletal:        General: No swelling. Normal range of motion.     Cervical back: Normal range of motion and neck supple.  Neurological:     General: No focal deficit present.     Mental Status:  She is alert and oriented to person, place, and time.  Psychiatric:        Mood and Affect: Mood normal.        Behavior: Behavior normal.        Thought Content: Thought content normal.        Judgment: Judgment normal.     Adult ECG Report  Rate: 63 ;  Rhythm: normal sinus rhythm and ST and T wave changes with questionable ischemia in lateral leads.  Appear more to be repolarization related.;   Narrative Interpretation: Stable EKG  Recent Labs: Should be due for labs next month. Lab Results  Component Value Date   CHOL 83 03/19/2019   HDL 36.10 (L) 03/19/2019   LDLCALC 35 03/19/2019   TRIG 59.0 03/19/2019   CHOLHDL 2 03/19/2019   Lab Results  Component Value Date   CREATININE 0.96 03/19/2019   BUN 22 03/19/2019   NA 139 03/19/2019   K 4.2 03/19/2019   CL 105 03/19/2019   CO2 30 03/19/2019   Lab Results  Component Value Date   TSH 5.30 (H) 03/19/2019    ASSESSMENT/PLAN    Problem List Items Addressed This Visit    Moderate to severe aortic stenosis - Primary (Chronic)    Borderline severe aortic stenosis.  We discussed potential concerning symptoms including chest pain or pressure with rest or exertion, exertional dyspnea, CHF symptoms of PND, orthopnea or edema and syncope/near syncope.  Thankfully, she has not had any symptoms.  She should be due for follow-up echo between now in February.      Relevant Medications   atorvastatin (LIPITOR) 40 MG tablet   Other Relevant  Orders   ECHOCARDIOGRAM COMPLETE   Hyperlipidemia with target LDL less than 70 (Chronic)    Her lipids look amazing with total cholesterol of 83 and LDL of 35.  With her starting have some memory issues, I think we can probably try back down on her statin to 40 mg daily.  May need to consider full cessation if memory worsens      Relevant Medications   atorvastatin (LIPITOR) 40 MG tablet   Essential hypertension (Chronic)    Blood pressure is a bit high today, but she says at home it  usually runs in the 140s She is at max dose telmisartan as well as probably chlorthalidone.  We could theoretically increase her carvedilol, but I am fearful how that would affect her energy level.  Continue to monitor and adjust if pressures continue to be elevated.      Relevant Medications   atorvastatin (LIPITOR) 40 MG tablet   Morbid obesity ([place]) (Chronic)    Talk with the importance of trying to work on her exercising and adjusting her diet to try to lose some weight.  This will help with some of her dyspnea.      Relevant Medications   atorvastatin (LIPITOR) 40 MG tablet   Chronic diastolic heart failure ([facility]) (Chronic)    She does have some diastolic function but no real PND orthopnea.  Not requiring full diuretic besides chlorthalidone.  I think that could explain some exertional dyspnea, but that could easily be misinterpreted as a symptom of heart failure as opposed to valve disease.  Monitor for worsening.      Relevant Medications   atorvastatin (LIPITOR) 40 MG tablet   Coronary artery disease, non-occlusive (Chronic)    She has had nonobstructive disease by cath in 2017. Continue recommend risk factor modification with adequate blood pressure control, glycemic control and lipid management.  No signs or symptoms of angina.  If she were to start having symptoms, I think we probably need to consider aortic stenosis as etiology more so than coronary disease.  Would need to have reevaluation prior to referral for valve replacement.      Relevant Medications   atorvastatin (LIPITOR) 40 MG tablet   Other Relevant Orders   ECHOCARDIOGRAM COMPLETE       COVID-19 Education: The signs and symptoms of COVID-19 were discussed with the patient and how to seek care for testing (follow up with PCP or arrange E-visit).   The importance of social distancing was discussed today.  I spent a total of 25 minutes with the patient. >  50% of the time was spent in direct patient  consultation.  Additional time spent with chart review  / charting (studies, outside notes, etc): 8 Total Time: 74mn  Current medicines are reviewed at length with the patient today.  (+/- concerns) none   Patient Instructions / Medication Changes & Studies & Tests Ordered   Patient Instructions  Medication Instructions:   Decrease atorvastatin to 40 mg daily   new prescription sent to pharmacy    *If you need a refill on your cardiac medications before your next appointment, please call your pharmacy*   Lab Work: Not needed    Testing/Procedures: [place] be [place] you have an echocardiogram. Echocardiography is a painless test that uses sound waves to create images of your heart. It provides your doctor with information about the size and shape of your heart and how  well your heart's chambers and valves are working. This procedure takes approximately one hour. There are no restrictions for this procedure.     Follow-Up: At [facility], you and your health needs are our priority.  As part of our continuing mission to provide you with exceptional heart care, we have created designated Provider Care Teams.  These Care Teams include your primary Cardiologist (physician) and Advanced Practice Providers (APPs -  Physician Assistants and Nurse Practitioners) who all work together to provide you with the care you need, when you need it.  We recommend signing up for the patient portal called "MyChart".  Sign up information is provided on this After Visit Summary.  MyChart is used to connect with patients for Virtual Visits (Telemedicine).  Patients are able to view lab/test results, encounter notes, upcoming appointments, etc.  Non-urgent messages can be sent to your provider as well.   To learn more about what you can do with MyChart, go to [email or web address].    Your next appointment:   8 month(s)- FEb  2022  The format for your next appointment:   In Person  Provider:   Glenetta Hew, MD     Studies Ordered:   Orders Placed This Encounter  Procedures  . ECHOCARDIOGRAM COMPLETE     Glenetta Hew, M.D., M.S. Interventional Cardiologist   Pager # [phone] Phone # [phone] [address] [place], [place] [address]   Thank you for choosing Heartcare at [facility]!!

## 2019-08-29 ENCOUNTER — Encounter: Payer: Self-pay | Admitting: Cardiology

## 2019-08-29 NOTE — Assessment & Plan Note (Signed)
Talk with the importance of trying to work on her exercising and adjusting her diet to try to lose some weight.  This will help with some of her dyspnea.

## 2019-08-29 NOTE — Assessment & Plan Note (Signed)
She has had nonobstructive disease by cath in 2017. Continue recommend risk factor modification with adequate blood pressure control, glycemic control and lipid management.  No signs or symptoms of angina.  If she were to start having symptoms, I think we probably need to consider aortic stenosis as etiology more so than coronary disease.  Would need to have reevaluation prior to referral for valve replacement.

## 2019-08-29 NOTE — Assessment & Plan Note (Signed)
Borderline severe aortic stenosis.  We discussed potential concerning symptoms including chest pain or pressure with rest or exertion, exertional dyspnea, CHF symptoms of PND, orthopnea or edema and syncope/near syncope.  Thankfully, she has not had any symptoms.  She should be due for follow-up echo between now in February.

## 2019-08-29 NOTE — Assessment & Plan Note (Signed)
Blood pressure is a bit high today, but she says at home it usually runs in the 140s She is at max dose telmisartan as well as probably chlorthalidone.  We could theoretically increase her carvedilol, but I am fearful how that would affect her energy level.  Continue to monitor and adjust if pressures continue to be elevated.

## 2019-08-29 NOTE — Assessment & Plan Note (Signed)
She does have some diastolic function but no real PND orthopnea.  Not requiring full diuretic besides chlorthalidone.  I think that could explain some exertional dyspnea, but that could easily be misinterpreted as a symptom of heart failure as opposed to valve disease.  Monitor for worsening.

## 2019-08-29 NOTE — Assessment & Plan Note (Signed)
Her lipids look amazing with total cholesterol of 83 and LDL of 35.  With her starting have some memory issues, I think we can probably try back down on her statin to 40 mg daily.  May need to consider full cessation if memory worsens

## 2019-09-01 ENCOUNTER — Other Ambulatory Visit: Payer: Self-pay | Admitting: Internal Medicine

## 2019-09-01 NOTE — Telephone Encounter (Signed)
Please refill as per office routine med refill policy (all routine meds refilled for 3 mo or monthly per pt preference up to one year from last visit, then month to month grace period for 3 mo, then further med refills will have to be denied)  

## 2019-09-01 NOTE — Telephone Encounter (Signed)
Sorry, I think no refill needed

## 2019-09-11 ENCOUNTER — Other Ambulatory Visit (INDEPENDENT_AMBULATORY_CARE_PROVIDER_SITE_OTHER): Payer: Medicare Other

## 2019-09-11 ENCOUNTER — Other Ambulatory Visit: Payer: Self-pay

## 2019-09-11 DIAGNOSIS — E611 Iron deficiency: Secondary | ICD-10-CM

## 2019-09-11 DIAGNOSIS — E559 Vitamin D deficiency, unspecified: Secondary | ICD-10-CM

## 2019-09-11 DIAGNOSIS — Z0001 Encounter for general adult medical examination with abnormal findings: Secondary | ICD-10-CM | POA: Diagnosis not present

## 2019-09-11 DIAGNOSIS — E119 Type 2 diabetes mellitus without complications: Secondary | ICD-10-CM | POA: Diagnosis not present

## 2019-09-11 DIAGNOSIS — E538 Deficiency of other specified B group vitamins: Secondary | ICD-10-CM | POA: Diagnosis not present

## 2019-09-11 LAB — CBC WITH DIFFERENTIAL/PLATELET
Basophils Absolute: 0 10*3/uL (ref 0.0–0.1)
Basophils Relative: 0.3 % (ref 0.0–3.0)
Eosinophils Absolute: 0.1 10*3/uL (ref 0.0–0.7)
Eosinophils Relative: 2.5 % (ref 0.0–5.0)
HCT: 33.8 % — ABNORMAL LOW (ref 36.0–46.0)
Hemoglobin: 11.5 g/dL — ABNORMAL LOW (ref 12.0–15.0)
Lymphocytes Relative: 39.8 % (ref 12.0–46.0)
Lymphs Abs: 2 10*3/uL (ref 0.7–4.0)
MCHC: 34 g/dL (ref 30.0–36.0)
MCV: 95.8 fl (ref 78.0–100.0)
Monocytes Absolute: 0.5 10*3/uL (ref 0.1–1.0)
Monocytes Relative: 9.8 % (ref 3.0–12.0)
Neutro Abs: 2.3 10*3/uL (ref 1.4–7.7)
Neutrophils Relative %: 47.6 % (ref 43.0–77.0)
Platelets: 174 10*3/uL (ref 150.0–400.0)
RBC: 3.53 Mil/uL — ABNORMAL LOW (ref 3.87–5.11)
RDW: 13.5 % (ref 11.5–15.5)
WBC: 4.9 10*3/uL (ref 4.0–10.5)

## 2019-09-11 LAB — URINALYSIS, ROUTINE W REFLEX MICROSCOPIC
Bilirubin Urine: NEGATIVE
Hgb urine dipstick: NEGATIVE
Leukocytes,Ua: NEGATIVE
Nitrite: NEGATIVE
RBC / HPF: NONE SEEN (ref 0–?)
Specific Gravity, Urine: >=1.03 — AB (ref 1.000–1.030)
Urine Glucose: NEGATIVE
Urobilinogen, UA: 0.2 (ref 0.0–1.0)
pH: 5 (ref 5.0–8.0)

## 2019-09-11 LAB — IBC PANEL
Iron: 59 ug/dL (ref 42–145)
Saturation Ratios: 15 % — ABNORMAL LOW (ref 20.0–50.0)
Transferrin: 281 mg/dL (ref 212.0–360.0)

## 2019-09-11 LAB — VITAMIN B12: Vitamin B-12: 1241 pg/mL — ABNORMAL HIGH (ref 211–911)

## 2019-09-11 LAB — HEPATIC FUNCTION PANEL
ALT: 14 U/L (ref 0–35)
AST: 14 U/L (ref 0–37)
Albumin: 3.7 g/dL (ref 3.5–5.2)
Alkaline Phosphatase: 38 U/L — ABNORMAL LOW (ref 39–117)
Bilirubin, Direct: 0.2 mg/dL (ref 0.0–0.3)
Total Bilirubin: 0.9 mg/dL (ref 0.2–1.2)
Total Protein: 6.5 g/dL (ref 6.0–8.3)

## 2019-09-11 LAB — BASIC METABOLIC PANEL
BUN: 18 mg/dL (ref 6–23)
CO2: 28 mEq/L (ref 19–32)
Calcium: 9.7 mg/dL (ref 8.4–10.5)
Chloride: 108 mEq/L (ref 96–112)
Creatinine, Ser: 0.89 mg/dL (ref 0.40–1.20)
GFR: 60.88 mL/min (ref >60.00)
Glucose, Bld: 150 mg/dL — ABNORMAL HIGH (ref 70–99)
Potassium: 4.5 mEq/L (ref 3.5–5.1)
Sodium: 141 mEq/L (ref 135–145)

## 2019-09-11 LAB — MICROALBUMIN / CREATININE URINE RATIO
Creatinine,U: 202.3 mg/dL
Microalb Creat Ratio: 2.8 mg/g (ref 0.0–30.0)
Microalb, Ur: 5.7 mg/dL — ABNORMAL HIGH (ref 0.0–1.9)

## 2019-09-11 LAB — TSH: TSH: 4.66 u[IU]/mL — ABNORMAL HIGH (ref 0.35–4.50)

## 2019-09-11 LAB — HEMOGLOBIN A1C: Hgb A1c MFr Bld: 7.4 % — ABNORMAL HIGH (ref 4.6–6.5)

## 2019-09-11 LAB — LIPID PANEL
Cholesterol: 87 mg/dL (ref 0–200)
HDL: 36.4 mg/dL — ABNORMAL LOW (ref >39.00)
LDL Cholesterol: 39 mg/dL (ref 0–99)
NonHDL: 50.79
Total CHOL/HDL Ratio: 2
Triglycerides: 59 mg/dL (ref 0.0–149.0)
VLDL: 11.8 mg/dL (ref 0.0–40.0)

## 2019-09-11 LAB — VITAMIN D 25 HYDROXY (VIT D DEFICIENCY, FRACTURES): VITD: 23.26 ng/mL — ABNORMAL LOW (ref 30.00–100.00)

## 2019-09-14 ENCOUNTER — Other Ambulatory Visit: Payer: Self-pay | Admitting: Internal Medicine

## 2019-09-16 ENCOUNTER — Encounter: Payer: Self-pay | Admitting: Internal Medicine

## 2019-09-16 ENCOUNTER — Other Ambulatory Visit: Payer: Self-pay

## 2019-09-16 ENCOUNTER — Ambulatory Visit (INDEPENDENT_AMBULATORY_CARE_PROVIDER_SITE_OTHER): Payer: Medicare Other | Admitting: Internal Medicine

## 2019-09-16 VITALS — BP 130/80 | HR 72 | Temp 98.3°F | Ht 61.0 in | Wt 219.0 lb

## 2019-09-16 DIAGNOSIS — E538 Deficiency of other specified B group vitamins: Secondary | ICD-10-CM

## 2019-09-16 DIAGNOSIS — E119 Type 2 diabetes mellitus without complications: Secondary | ICD-10-CM | POA: Diagnosis not present

## 2019-09-16 DIAGNOSIS — I5032 Chronic diastolic (congestive) heart failure: Secondary | ICD-10-CM | POA: Diagnosis not present

## 2019-09-16 DIAGNOSIS — E559 Vitamin D deficiency, unspecified: Secondary | ICD-10-CM

## 2019-09-16 DIAGNOSIS — I10 Essential (primary) hypertension: Secondary | ICD-10-CM

## 2019-09-16 NOTE — Patient Instructions (Signed)
Please continue all other medications as before, and refills have been done if requested.  Please have the pharmacy call with any other refills you may need.  Please continue your efforts at being more active, low cholesterol diet, and weight control.  You are otherwise up to date with prevention measures today.  Please keep your appointments with your specialists as you may have planned - Dr Herbie Baltimore /cardiology  Please make an Appointment to return in 6 months, or sooner if needed

## 2019-09-16 NOTE — Progress Notes (Signed)
Subjective:    Patient ID: Mary Mccarthy, female    DOB: 1940/09/03, 79 y.o.   MRN: [id number]  HPI  Here to f/u; overall doing ok,  Pt denies chest pain, increasing sob or doe, wheezing, orthopnea, PND, increased LE swelling, palpitations, dizziness or syncope.  Pt denies new neurological symptoms such as new headache, or facial or extremity weakness or numbness.  Pt denies polydipsia, polyuria, or low sugar episode.  Pt states overall good compliance with meds, mostly trying to follow appropriate diet, with wt overall stable,  but little exercise however. Wt incresaed 5 lbs and worsening leg swelling in the past wk, a lot of sitting as afraid to leave the house, plans to f/u with dr Hardy/cards soon.  Denies hyper or hypo thyroid symptoms such as voice, skin or hair change.  Tolerating vit d and b12 oral Past Medical History:  Diagnosis Date  . ALLERGIC RHINITIS 06/24/2007  . Anemia   . AVM (arteriovenous malformation) of colon   . Blood transfusion without reported diagnosis 05/2015  . COLONIC POLYPS, HX OF 06/24/2007  . Coronary artery disease, non-occlusive 05/2015   Positive troponin in setting of acute anemia.  Cardiac cath showed ostial ramus 60%, ostial RCA 30% and distal LAD 40 and 50%.  . DEPRESSION 10/08/2006  . DIABETES MELLITUS, TYPE II 10/05/2006  . GERD 06/24/2007  . HYPERLIPIDEMIA 10/08/2006  . HYPERTENSION 10/08/2006  . INSOMNIA 09/24/2008  . Left knee DJD   . Moderate to severe aortic stenosis 05/2015   a) Moderate aortic stenosis (mean gradient 22 mmHg, peak 42 mmHg -AVA 1.1-1.2 cm.).;;b) 04/2017: Severe calcified aortic valve with moderate-severe aortic stenosis (peak-mean gradient 60-34 mmHg -moderate), calculated AVA 0.8 cm -severe..  . Myocardial infarction ([place]) 05/2015   Type 2 MI - related to GI Bleed / Anemia (non-obstructive CAD by Cath)  . Osteoporosis 03/10/2016  . PEPTIC ULCER DISEASE 10/08/2006   Past Surgical History:  Procedure Laterality Date  . APPENDECTOMY      . BREAST BIOPSY    . CARDIAC CATHETERIZATION N/A 05/21/2015   Procedure: Left Heart Cath and Coronary Angiography;  Surgeon: Leonie Man, MD;  Location: [facility];  Service: Cardiovascular;: Ost RI 60%, Ost RCA 30%, dLAD tapers to small vessel w/ 40-50%. Mildly elevated LVEDP. Normal LV Fxn.  . COLONOSCOPY N/A 05/20/2015   Procedure: COLONOSCOPY;  Surgeon: Manus Gunning, MD;  Location: [facility];  Service: Gastroenterology;  Laterality: N/A;  . ESOPHAGOGASTRODUODENOSCOPY N/A 05/20/2015   Procedure: ESOPHAGOGASTRODUODENOSCOPY (EGD);  Surgeon: Manus Gunning, MD;  Location: [facility];  Service: Gastroenterology;  Laterality: N/A;  . TONSILLECTOMY    . TRANSTHORACIC ECHOCARDIOGRAM  10/'15; 4/'17   a) mild AS, mildly elevated PA pressures.;; b)  Moderate LVH.  EF 55-60%.  Moderate aortic stenosis (mean gradient 22 mmHg, peak 42 mmHg -AVA 1.1-1.2 cm.).  Severe LA dilation.  Moderate TR with RVSP peak 51 mmHg and dilated IVC  . TRANSTHORACIC ECHOCARDIOGRAM  04/12/2017   EF 60-65%. No RWMA.  GR 1 DD.  Moderate concentric LVH.  Mild LA dilation. Severe calcified aortic valve with moderate-severe aortic stenosis (peak/mean gradients 60/34 mmHg) and in severe range by AVA (0.8 cm2).  Mild to moderately increased PA pressures 41 mmHg with normal RV size and function..    . TUBAL LIGATION      reports that she has never smoked. She has never used smokeless tobacco. She reports that she does not drink alcohol and does not use drugs.  family history includes Alcohol abuse in an other family member; Arrhythmia in her sister; Arthritis in an other family member; CAD in her sister; Cancer in her mother; Depression in an other family member; Diabetes in an other family member; Heart attack (age of onset: 36) in her sister; Heart disease in an other family member; Hyperlipidemia in an other family member; Hypertension in an other family member; Stroke in an other family  member. Allergies  Allergen Reactions  . Ibuprofen Other (See Comments)    Bleeding events   Current Outpatient Medications on File Prior to Visit  Medication Sig Dispense Refill  . acetaminophen (TYLENOL) 500 MG tablet Take 500 mg by mouth every 6 (six) hours as needed for moderate pain.    Marland Kitchen atorvastatin (LIPITOR) 40 MG tablet Take 1 tablet (40 mg total) by mouth daily. 90 tablet 3  . blood glucose meter kit and supplies Dispense based on patient and insurance preference. Use up to four times daily as directed. E11.9 1 each 0  . Blood Glucose Monitoring Suppl (ONE TOUCH ULTRA 2) w/Device KIT Use as directed daily 1 each 0  . carvedilol (COREG) 6.25 MG tablet Take 1 tablet (6.25 mg total) by mouth 2 (two) times daily. 180 tablet 1  . chlorthalidone (HYGROTON) 25 MG tablet TAKE 1 TABLET BY MOUTH EVERY DAY 90 tablet 1  . glimepiride (AMARYL) 2 MG tablet Take 1 tablet (2 mg total) by mouth daily before breakfast. 90 tablet 1  . Lancets (ONETOUCH DELICA PLUS [id number]) MISC USE AS DIRECTED TEST 1-2 TIMES A DAY 100 each 23  . metFORMIN (GLUCOPHAGE) 1000 MG tablet TAKE 1 TAB BY MOUTH IN THE AM,AND 1/2 TAB IN THE PM. 135 tablet 3  . ONETOUCH ULTRA test strip USE AS INSTRUCTED TEST 1-2 TIMES A DAY 100 strip 23  . pantoprazole (PROTONIX) 40 MG tablet TAKE 1 TABLET BY MOUTH EVERY DAY 90 tablet 3  . telmisartan (MICARDIS) 80 MG tablet Take 1 tablet (80 mg total) by mouth daily. 90 tablet 3  . vitamin B-12 (CYANOCOBALAMIN) 1000 MCG tablet Take 1 tablet (1,000 mcg total) by mouth daily. 90 tablet 1  . vitamin C (VITAMIN C) 500 MG tablet Take 1 tablet (500 mg total) by mouth 2 (two) times daily. 60 tablet 0   No current facility-administered medications on file prior to visit.   Review of Systems All otherwise neg per pt=    Objective:   Physical Exam BP 130/80 (BP Location: Left Arm, Patient Position: Sitting, Cuff Size: Large)   Pulse 72   Temp 98.3 F (36.8 C) (Oral)   Ht '5\' 1"'$  (1.549 m)    Wt 219 lb (99.3 kg)   SpO2 95%   BMI 41.38 kg/m  VS noted,  Constitutional: Pt appears in NAD HENT: Head: NCAT.  Right Ear: External ear normal.  Left Ear: External ear normal.  Eyes: . Pupils are equal, round, and reactive to light. Conjunctivae and EOM are normal Nose: without d/c or deformity Neck: Neck supple. Gross normal ROM Cardiovascular: Normal rate and regular rhythm.   Pulmonary/Chest: Effort normal and breath sounds without rales or wheezing.  Abd:  Soft, NT, ND, + BS, no organomegaly Neurological: Pt is alert. At baseline orientation, motor grossly intact Skin: Skin is warm. No rashes, other new lesions, trace bilat LE edema Psychiatric: Pt behavior is normal without agitation  All otherwise neg per pt Lab Results  Component Value Date   WBC 4.9 09/11/2019   HGB 11.5 (L)  09/11/2019   HCT 33.8 (L) 09/11/2019   PLT 174.0 09/11/2019   GLUCOSE 150 (H) 09/11/2019   CHOL 87 09/11/2019   TRIG 59.0 09/11/2019   HDL 36.40 (L) 09/11/2019   LDLCALC 39 09/11/2019   ALT 14 09/11/2019   AST 14 09/11/2019   NA 141 09/11/2019   K 4.5 09/11/2019   CL 108 09/11/2019   CREATININE 0.89 09/11/2019   BUN 18 09/11/2019   CO2 28 09/11/2019   TSH 4.66 (H) 09/11/2019   INR 1.18 05/21/2015   HGBA1C 7.4 (H) 09/11/2019   MICROALBUR 5.7 (H) 09/11/2019          Assessment & Plan:

## 2019-09-17 ENCOUNTER — Other Ambulatory Visit: Payer: Self-pay | Admitting: Internal Medicine

## 2019-09-17 NOTE — Telephone Encounter (Signed)
Done erx 

## 2019-09-18 ENCOUNTER — Telehealth: Payer: Self-pay | Admitting: Cardiology

## 2019-09-18 NOTE — Telephone Encounter (Signed)
Spoke to pt who report on Saturday she had an episode at 4 am where her face and chest was throbbing. She state her left breast was hurting so bad it felt like it was going to pop out of her chest. Pt report symptoms lasted for hours and breathing was rapid. Pt denies symptoms currently and voice she feels fine but wanted to know if should be seen in office vs virtual appointment scheduled for 8/17.  Nurse checked schedule and none available prior to scheduled appointment. Will route to MD for recommendations.

## 2019-09-18 NOTE — Telephone Encounter (Signed)
New message     Patient has a virtual visit scheduled with Dr Herbie Baltimore on Tuesday.  She want to talk to the nurse to tell her what is going on with her and see if she needs an in-office appointment or if a virtual visit will be ok.

## 2019-09-19 NOTE — Telephone Encounter (Signed)
I think we can do a virtual visit just to talk through things, if she has issues that need to be dealt with in person, we can at least have talked with her and decide if she needs to be seen in person.  Bryan Lemma, MD

## 2019-09-20 ENCOUNTER — Other Ambulatory Visit: Payer: Self-pay | Admitting: Internal Medicine

## 2019-09-20 NOTE — Telephone Encounter (Signed)
Please refill as per office routine med refill policy (all routine meds refilled for 3 mo or monthly per pt preference up to one year from last visit, then month to month grace period for 3 mo, then further med refills will have to be denied)  

## 2019-09-21 ENCOUNTER — Encounter: Payer: Self-pay | Admitting: Internal Medicine

## 2019-09-21 DIAGNOSIS — E559 Vitamin D deficiency, unspecified: Secondary | ICD-10-CM | POA: Insufficient documentation

## 2019-09-21 DIAGNOSIS — E538 Deficiency of other specified B group vitamins: Secondary | ICD-10-CM | POA: Insufficient documentation

## 2019-09-21 NOTE — Assessment & Plan Note (Signed)
Cont oral replacement 

## 2019-09-21 NOTE — Assessment & Plan Note (Addendum)
stable overall by history and exam, recent data reviewed with pt, and pt to continue medical treatment as before,  to f/u any worsening symptoms or concerns, to f/u cardiology as planned  I spent 31 minutes in preparing to see the patient by review of recent labs, imaging and procedures, obtaining and reviewing separately obtained history, communicating with the patient and family or caregiver, ordering medications, tests or procedures, and documenting clinical information in the EHR including the differential Dx, treatment, and any further evaluation and other management of dm, htn, chf, vit d and b12 deficiency

## 2019-09-21 NOTE — Assessment & Plan Note (Signed)
stable overall by history and exam, recent data reviewed with pt, and pt to continue medical treatment as before,  to f/u any worsening symptoms or concerns  

## 2019-09-22 ENCOUNTER — Encounter: Payer: Self-pay | Admitting: Cardiology

## 2019-09-22 ENCOUNTER — Ambulatory Visit: Payer: Medicare Other | Admitting: Cardiology

## 2019-09-22 ENCOUNTER — Other Ambulatory Visit: Payer: Self-pay

## 2019-09-22 VITALS — BP 140/86 | HR 61 | Ht 61.0 in | Wt 215.0 lb

## 2019-09-22 DIAGNOSIS — I208 Other forms of angina pectoris: Secondary | ICD-10-CM

## 2019-09-22 DIAGNOSIS — I10 Essential (primary) hypertension: Secondary | ICD-10-CM | POA: Diagnosis not present

## 2019-09-22 DIAGNOSIS — I2510 Atherosclerotic heart disease of native coronary artery without angina pectoris: Secondary | ICD-10-CM

## 2019-09-22 DIAGNOSIS — R079 Chest pain, unspecified: Secondary | ICD-10-CM | POA: Insufficient documentation

## 2019-09-22 DIAGNOSIS — E785 Hyperlipidemia, unspecified: Secondary | ICD-10-CM | POA: Diagnosis not present

## 2019-09-22 DIAGNOSIS — I350 Nonrheumatic aortic (valve) stenosis: Secondary | ICD-10-CM

## 2019-09-22 NOTE — Telephone Encounter (Signed)
Pt called and rescheduled to office visit on 8/16.

## 2019-09-22 NOTE — Progress Notes (Addendum)
.  Primary Care Provider: Biagio Borg, MD Cardiologist: Glenetta Hew, MD Electrophysiologist: None  Clinic Note: Chief Complaint  Patient presents with  . Chest Pain    1 long episode of significant left-sided chest pain about 1 week ago  . Aortic Stenosis    HPI:    Mary Mccarthy is a 79 y.o. female with a PMH below who presents today for 85-monthfollow-up for moderate to severe aortic stenosis and hypertension, and pulmonary hypertension with morbid obesity with prior history of SVT.  She is being seen today for follow-up of chest pain at the request of Dr. JCathlean Cower   Family History: Is notable for the fact that her sister (Mary Mccarthy is a well-known patient to our practice who was a patient of Dr. CSallyanne Kuster-.  She had significant CAD and arrhythmia issues - died NDecember 01, 2019   Cardiac cath in April 2017 showed mild-moderate nonobstructive CAD with moderate aortic stenosis on echo.  Mary Shonewas last seen on March 08, 2019-> he was high.  No cardiac symptoms.  Still quite upset about how things ended when her husband died back in O10-Oct-2020= she was quite upset troubled by how things happened.  Very tearful..  Recent Hospitalizations: None  Reviewed  CV studies:    The following studies were reviewed today: (if available, images/films reviewed: From Epic Chart or Care Everywhere) . Echocardiogram 03/17/2019: EF 60 to 65%.  Moderate LVH.  GRII DD.  Moderate-severe aortic stenosis-mean gradient 36 mmHg, peak 59 mmHg   Interval History:   Mary Shonereturns at the suggestion of her PCP because of an episode about a week ago where she had a very significant amount of chest discomfort. Basely about a week ago last Saturday she had an episode that started at 4 AM with extreme chest discomfort from her face down into her chest involving her jaw and both shoulders.  She felt as though her chest was may explode from a throbbing pressure pushing from within  out.  It was both squeezing and pushing and took her breath away.  She described it as being from just left of midline midway down the sternum and radiating all the way down underneath her breast to her mid axillary line.  But she also said that she felt that underneath her jawline chin as though her jaw was going to break.  The worst the pain lasted about 30 minutes in the chest and jaw, but overall it lasted several hours.  She indicated that she was too scared to go to the ER because of Covid.  She was too scared to lie down in her bed so she sat down on her sofa.  She felt as though her heart was pounding and racing out of her chest.  She says she can feel her heart beating in her face.  She also developed a pretty significant headache and she took a couple aspirin because of the pain and the headache.  She has not had anything like that since, but has noted that she is much more cognizant of symptoms and has been more short of breath doing vacuuming or other activities especially if she does anything in a hurry.  She has not had any exertional chest pain like she had at rest, but when she gets short of breath she may feel some tightness.  She has not gotten back on the treadmill because she is scared that the pain may come back.  She  is not can to do that without her son there anyway, and he has not been on to be at home.  She has baseline swelling in her feet, but not any PND orthopnea. She has not had any further palpitation episodes.  CV Review of Symptoms (Summary): positive for - chest pain, dyspnea on exertion, edema, shortness of breath and Pounding heartbeat and chest pain/shortness of breath noted above negative for - loss of consciousness, orthopnea, paroxysmal nocturnal dyspnea, rapid heart rate or She did get lightheaded and woozy and almost passed out with the pain, but did not have any true syncope.  No TIA/amaurosis fugax  The patient does not have symptoms concerning for COVID-19  infection (fever, chills, cough, or new shortness of breath).  The patient is practicing social distancing & Masking.  ++ She has had her 2 COVID-19 vaccine injections.   REVIEWED OF SYSTEMS   A comprehensive ROS was performed.  See HPI for full details Review of Systems  Constitutional: Negative for malaise/fatigue and weight loss (Relatively stable over the last couple months).  HENT: Negative for congestion and nosebleeds.   Respiratory: Negative for cough and wheezing.   Gastrointestinal: Negative for blood in stool and melena.  Genitourinary: Negative for hematuria.  Musculoskeletal: Positive for back pain and joint pain (Mild arthritis pains.). Negative for falls (But she is scared of falling).  Neurological: Positive for dizziness (Has unsteady gait and some balance issues.) and headaches. Negative for focal weakness and weakness.  Psychiatric/Behavioral: Positive for memory loss. The patient has insomnia (Improving now.  The further she gets up from the trauma of her husband's death). The patient is not nervous/anxious (With the exception of anxiety about going out with COVID-19 concerns).        Still having issues after her husband's death.  Very upsetting to her some the comments he made prior to his final days.  She is upset that of their last few months together, they argued more than usual and things were difficult.    I have reviewed and (if needed) personally updated the patient's problem list, medications, allergies, past medical and surgical history, social and family history.   PAST MEDICAL HISTORY   Past Medical History:  Diagnosis Date  . ALLERGIC RHINITIS 06/24/2007  . Anemia   . AVM (arteriovenous malformation) of colon   . Blood transfusion without reported diagnosis 05/2015  . COLONIC POLYPS, HX OF 06/24/2007  . Coronary artery disease, non-occlusive 05/2015   Positive troponin in setting of acute anemia.  Cardiac cath showed ostial ramus 60%, ostial RCA 30% and  distal LAD 40 and 50%.  . DEPRESSION 10/08/2006  . DIABETES MELLITUS, TYPE II 10/05/2006  . GERD 06/24/2007  . HYPERLIPIDEMIA 10/08/2006  . HYPERTENSION 10/08/2006  . INSOMNIA 09/24/2008  . Left knee DJD   . Moderate to severe aortic stenosis 05/2015   a) Moderate aortic stenosis (mean gradient 22 mmHg, peak 42 mmHg -AVA 1.1-1.2 cm.).;;b) 04/2017: Severe calcified aortic valve with moderate-severe aortic stenosis (peak-mean gradient 60-34 mmHg -moderate), calculated AVA 0.8 cm -severe..  . Myocardial infarction ([place]) 05/2015   Type 2 MI - related to GI Bleed / Anemia (non-obstructive CAD by Cath)  . Osteoporosis 03/10/2016  . PEPTIC ULCER DISEASE 10/08/2006    PAST SURGICAL HISTORY   Past Surgical History:  Procedure Laterality Date  . APPENDECTOMY    . BREAST BIOPSY    . CARDIAC CATHETERIZATION N/A 05/21/2015   Procedure: Left Heart Cath and Coronary Angiography;  Surgeon: Shanon Brow  Loren Racer, MD;  Location: [facility];  Service: Cardiovascular;: Ost RI 60%, Ost RCA 30%, dLAD tapers to small vessel w/ 40-50%. Mildly elevated LVEDP. Normal LV Fxn.  . COLONOSCOPY N/A 05/20/2015   Procedure: COLONOSCOPY;  Surgeon: Manus Gunning, MD;  Location: [facility];  Service: Gastroenterology;  Laterality: N/A;  . ESOPHAGOGASTRODUODENOSCOPY N/A 05/20/2015   Procedure: ESOPHAGOGASTRODUODENOSCOPY (EGD);  Surgeon: Manus Gunning, MD;  Location: [facility];  Service: Gastroenterology;  Laterality: N/A;  . TONSILLECTOMY    . TRANSTHORACIC ECHOCARDIOGRAM  03/2019   EF 60 to 65%.  Moderate LVH.  GRII DD.  Moderate-severe aortic stenosis-mean gradient 36 mmHg, peak 59 mmHg  . TRANSTHORACIC ECHOCARDIOGRAM  04/12/2017   EF 60-65%. No RWMA.  GR 1 DD.  Moderate concentric LVH.  Mild LA dilation. Severe calcified aortic valve with moderate-severe aortic stenosis (peak/mean gradients 60/34 mmHg) and in severe range by AVA (0.8 cm2).  Mild to moderately increased PA pressures 41 mmHg with normal RV  size and function..    . TUBAL LIGATION      MEDICATIONS/ALLERGIES   Current Meds  Medication Sig  . acetaminophen (TYLENOL) 500 MG tablet Take 500 mg by mouth every 6 (six) hours as needed for moderate pain.  Marland Kitchen atorvastatin (LIPITOR) 40 MG tablet Take 1 tablet (40 mg total) by mouth daily.  . blood glucose meter kit and supplies Dispense based on patient and insurance preference. Use up to four times daily as directed. E11.9  . Blood Glucose Monitoring Suppl (ONE TOUCH ULTRA 2) w/Device KIT Use as directed daily  . carvedilol (COREG) 6.25 MG tablet Take 1 tablet (6.25 mg total) by mouth 2 (two) times daily.  . chlorthalidone (HYGROTON) 25 MG tablet TAKE 1 TABLET BY MOUTH EVERY DAY  . clonazePAM (KLONOPIN) 0.5 MG tablet TAKE 1 TABLET BY MOUTH TWICE A DAY AS NEEDED FOR ANXIETY  . Lancets (ONETOUCH DELICA PLUS [id number]) MISC USE AS DIRECTED TEST 1-2 TIMES A DAY  . metFORMIN (GLUCOPHAGE) 1000 MG tablet TAKE 1 TAB BY MOUTH IN THE AM,AND 1/2 TAB IN THE PM.  . ONETOUCH ULTRA test strip USE AS INSTRUCTED TEST 1-2 TIMES A DAY  . pantoprazole (PROTONIX) 40 MG tablet TAKE 1 TABLET BY MOUTH EVERY DAY  . telmisartan (MICARDIS) 80 MG tablet Take 1 tablet (80 mg total) by mouth daily.  . vitamin B-12 (CYANOCOBALAMIN) 1000 MCG tablet Take 1 tablet (1,000 mcg total) by mouth daily.  . vitamin C (VITAMIN C) 500 MG tablet Take 1 tablet (500 mg total) by mouth 2 (two) times daily.  . [DISCONTINUED] glimepiride (AMARYL) 2 MG tablet Take 1 tablet (2 mg total) by mouth daily before breakfast.    Allergies  Allergen Reactions  . Ibuprofen Other (See Comments)    Bleeding events    SOCIAL HISTORY/FAMILY HISTORY   Social History   Tobacco Use  . Smoking status: Never Smoker  . Smokeless tobacco: Never Used  Substance Use Topics  . Alcohol use: No    Alcohol/week: 0.0 standard drinks  . Drug use: No   Social History   Social History Narrative   Recently widowed--husband died in December 01, 2018.       Now lives alone but her son usually stays with her at night.  During the day she has 2 nephews who live nearby to come in and check on her intermittently.  Also one of her nieces calls routinely.      When her son is home and awake, she  may try to walk on a treadmill, but is scared to walk outside.    Family History family history includes Alcohol abuse in an other family member; Arrhythmia in her sister; Arthritis in an other family member; CAD in her sister; Cancer in her mother; Depression in an other family member; Diabetes in an other family member; Heart attack (age of onset: 1) in her sister; Heart disease in an other family member; Hyperlipidemia in an other family member; Hypertension in an other family member; Stroke in an other family member.  OBJCTIVE -PE, EKG, labs   Wt Readings from Last 3 Encounters:  09/22/19 215 lb (97.5 kg)  09/16/19 219 lb (99.3 kg)  08/21/19 217 lb (98.4 kg)    Physical Exam: BP 140/86   Pulse 61   Ht '5\' 1"'$  (1.549 m)   Wt 215 lb (97.5 kg)   BMI 40.62 kg/m  Physical Exam Vitals reviewed.  Constitutional:      General: She is not in acute distress.    Appearance: She is well-developed. She is not ill-appearing.     Comments: Morbidly obese-mostly truncal.  Well-groomed.  HENT:     Head: Normocephalic and atraumatic.  Neck:     Vascular: No carotid bruit (Radiated aortic murmur).     Comments: Unable to really assess JVP Cardiovascular:     Rate and Rhythm: Normal rate and regular rhythm.  No extrasystoles are present.    Chest Wall: PMI is not displaced (Difficult to palpate).     Pulses: Decreased pulses (Because of body habitus.).     Heart sounds: S1 normal and S2 normal. Heart sounds are distant. Murmur heard.  Medium-pitched harsh crescendo-decrescendo mid to late systolic murmur is present with a grade of 3/6 at the upper right sternal border radiating to the neck.  No friction rub. No gallop.   Pulmonary:     Effort: Pulmonary  effort is normal. No respiratory distress.     Breath sounds: Normal breath sounds. No wheezing or rales.  Chest:     Chest wall: Tenderness (She did have some tenderness along the left sternal border radiating along the lower ribs.) present.  Abdominal:     General: Bowel sounds are normal. There is no distension.     Palpations: Abdomen is soft.     Tenderness: There is no abdominal tenderness. There is no rebound.     Comments: Truncal obesity.  Unable assess HSM.  Musculoskeletal:        General: Swelling (Trivial bilateral LE) present. Normal range of motion.     Cervical back: Normal range of motion and neck supple.  Neurological:     General: No focal deficit present.     Mental Status: She is alert and oriented to person, place, and time.  Psychiatric:        Mood and Affect: Mood normal.        Behavior: Behavior normal.        Thought Content: Thought content normal.        Judgment: Judgment normal.     Adult ECG Report Not checked None none none Recent Labs:  Lab Results  Component Value Date   CHOL 87 09/11/2019   HDL 36.40 (L) 09/11/2019   LDLCALC 39 09/11/2019   TRIG 59.0 09/11/2019   CHOLHDL 2 09/11/2019   Lab Results  Component Value Date   CREATININE 0.89 09/11/2019   BUN 18 09/11/2019   NA 141 09/11/2019   K 4.5 09/11/2019  CL 108 09/11/2019   CO2 28 09/11/2019   Lab Results  Component Value Date   TSH 4.66 (H) 09/11/2019    ASSESSMENT/PLAN    Problem List Items Addressed This Visit    Moderate to severe aortic stenosis - Primary (Chronic)    She does have borderline severe aortic stenosis, but has not had symptoms of anything until now.  The plan was to recheck echocardiogram in February, but now that she had symptoms, which have to exclude progression of disease.  Plan: Recheck 2D echo      Hyperlipidemia with target LDL less than 70 (Chronic)    Check lipids earlier this month and her LDL is excellent.  For now, given concerns about  possible worsening coronary disease, will continue on current dose of statin. ->  Pending results, we may be to consider cutting down a little bit      Essential hypertension (Chronic)    She is quite anxious today and her blood pressure is actually better than it has been.  I have asked that she take an extra dose of chlorthalidone today and tomorrow.  We may need to consider calcium channel blocker if pressures continue elevated.  For now we will continue current dose of carvedilol (not able to titrate further because of bradycardia), and Micardis along with chlorthalidone.      Coronary artery disease, non-occlusive (Chronic)    Minimal disease back in 2017, but now with a significant episode of chest pain.  She does have risk factors of obesity, hypertension hyperlipidemia as well as diabetes.  It is quite possible that she could have had acute coronary syndrome type of plaque rupture or plaque hemorrhage leading to progression of disease.  If she has any more symptoms, would consider adding as needed nitroglycerin.  Question if this is truly anginal symptoms that she is having as it is not related to her valve or coronary disease.  Need to determine extent of disease  Plan: Continue carvedilol and atorvastatin along with ARB.  Evaluate aortic valve and also for any regional wall motion normalities with rhythm 2D echo  Ischemic evaluation with coronary CT angiogram      Anginal chest pain at rest [facility])    Pretty significant chest discomfort noted that occurred at rest.  It woke her up from sleep pressure and squeezing sounding.  Some very typical features.  The atypical features of that she has not had similar symptoms with exertion since.  Would be very leery of doing a treadmill stress test given her aortic stenosis and arthritis symptoms.  Myoview would not be effective given her body habitus.  At this point we want to exclude significant CAD and also progression of aortic valve  disease.  Plan: Recheck 2D echocardiogram and coronary CT angiogram  -> If there is no evidence of worsening CAD or aortic stenosis, would consider the possibility of this being done really bad case of costochondritis.      Relevant Orders   CT CORONARY MORPH W/CTA COR W/SCORE W/CA W/CM &/OR WO/CM   CT CORONARY FRACTIONAL FLOW RESERVE DATA PREP   CT CORONARY FRACTIONAL FLOW RESERVE FLUID ANALYSIS   Basic metabolic panel       [id number] Education: The signs and symptoms of COVID-19 were discussed with the patient and how to seek care for testing (follow up with PCP or arrange E-visit).   The importance of social distancing was discussed today.  I spent a total of 55mnutes with the patient. >  50% of the time was spent in direct patient consultation.  Additional time spent with chart review  / charting (studies, outside notes, etc): 9 Total Time: 74mn  Current medicines are reviewed at length with the patient today.  (+/- concerns) none   Patient Instructions / Medication Changes & Studies & Tests Ordered   Patient Instructions  Medication Instructions:  The current medical regimen is effective;  continue present plan and medications.  *If you need a refill on your cardiac medications before your next appointment, please call your pharmacy*   Lab Work: BMET- 1 week before CT when scheduled.   If you have labs (blood work) drawn today and your tests are completely normal, you will receive your results only by: .Marland KitchenMyChart Message (if you have MyChart) OR . A paper copy in the mail If you have any lab test that is abnormal or we need to change your treatment, we will call you to review the results.   Testing/Procedures: Echocardiogram - Your physician has requested that you have an echocardiogram. Echocardiography is a painless test that uses sound waves to create images of your heart. It provides your doctor with information about the size and shape of your heart and how  well your heart's chambers and valves are working. This procedure takes approximately one hour. There are no restrictions for this procedure. [facility] [address].  Your physician has requested that you have cardiac CT. Cardiac computed tomography (CT) is a painless test that uses an x-ray machine to take clear, detailed pictures of your heart. For further information please visit [email or web address] Please follow instruction sheet as given.    Follow-Up: [facility] and your health needs are our priority.  As part of our continuing mission to provide you with exceptional heart care, we have created designated Provider Care Teams.  These Care Teams include your primary Cardiologist (physician) and Advanced Practice Providers (APPs -  Physician Assistants and Nurse Practitioners) who all work together to provide you with the care you need, when you need it.  We recommend signing up for the patient portal called "MyChart".  Sign up information is provided on this After Visit Summary.  MyChart is used to connect with patients for Virtual Visits (Telemedicine).  Patients are able to view lab/test results, encounter notes, upcoming appointments, etc.  Non-urgent messages can be sent to your provider as well.   To learn more about what you can do with MyChart, go to [email or web address]    Your next appointment:   Follow up after CT- call when scheduled.   The format for your next appointment:   Virtual Visit   Provider:   DGlenetta Hew MD   Other Instructions  Your cardiac CT will be scheduled at one of the below locations:   [facility]GYaurel [place] [phone] If scheduled [facility] please [facility] the [facility] minutes prior to test start time. Proceed to the [facility] (first floor) to  check-in and test prep.  Please follow these instructions carefully (unless otherwise directed):  Hold all erectile dysfunction medications at least 3 days (72 hrs) prior to test.  On the Night Before the Test: . Be sure to Drink plenty of water. . Do not consume any caffeinated/decaffeinated beverages or chocolate 12 hours prior to your test. .  Do not take any antihistamines 12 hours prior to your test.  On the Day of the Test: . Drink plenty of water. Do not drink any water within one hour of the test. . Do not eat any food 4 hours prior to the test. . You may take your regular medications prior to the test.  . Take metoprolol (Lopressor) two hours prior to test. . HOLD Furosemide/Hydrochlorothiazide morning of the test. . FEMALES- please wear underwire-free bra if available       After the Test: . Drink plenty of water. . After receiving IV contrast, you may experience a mild flushed feeling. This is normal. . On occasion, you may experience a mild rash up to 24 hours after the test. This is not dangerous. If this occurs, you can take Benadryl 25 mg and increase your fluid intake. . If you experience trouble breathing, this can be serious. If it is severe call 911 IMMEDIATELY. If it is mild, please call our office. . If you take any of these medications: Glipizide/Metformin, Avandament, Glucavance, please do not take 48 hours after completing test unless otherwise instructed.   Once we have confirmed authorization from your insurance company, we will call you to set up a date and time for your test. Based on how quickly your insurance processes prior authorizations requests, please allow up to 4 weeks to be contacted for scheduling your Cardiac CT appointment. Be advised that routine Cardiac CT appointments could be scheduled as many as 8 weeks after your provider has ordered it.  For non-scheduling related questions, please contact the cardiac imaging nurse navigator should you have  any questions/concerns: Marchia Bond, Cardiac Imaging Nurse Navigator Burley Saver, Interim Cardiac Imaging Nurse [place] [facility] Direct Office Dial: [phone]   For scheduling needs, including cancellations and rescheduling, please call Vivien Rota at [phone], option 3.       Studies Ordered:   Orders Placed This Encounter  Procedures  . CT CORONARY MORPH W/CTA COR W/SCORE W/CA W/CM &/OR WO/CM  . CT CORONARY FRACTIONAL FLOW RESERVE DATA PREP  . CT CORONARY FRACTIONAL FLOW RESERVE FLUID ANALYSIS  . Basic metabolic panel     Glenetta Hew, M.D., M.S. Interventional Cardiologist   Pager # [phone] Phone # [phone] [address] [place], [place] [address]   Thank you for choosing [facility] at [facility]!!

## 2019-09-22 NOTE — Patient Instructions (Signed)
Medication Instructions:  The current medical regimen is effective;  continue present plan and medications.  *If you need a refill on your cardiac medications before your next appointment, please call your pharmacy*   Lab Work: BMET- 1 week before CT when scheduled.   If you have labs (blood work) drawn today and your tests are completely normal, you will receive your results only by: Marland Kitchen MyChart Message (if you have MyChart) OR . A paper copy in the mail If you have any lab test that is abnormal or we need to change your treatment, we will call you to review the results.   Testing/Procedures: Echocardiogram - Your physician has requested that you have an echocardiogram. Echocardiography is a painless test that uses sound waves to create images of your heart. It provides your doctor with information about the size and shape of your heart and how well your heart's chambers and valves are working. This procedure takes approximately one hour. There are no restrictions for this procedure. This will be performed at our [facility] location - [address], [address].  Your physician has requested that you have cardiac CT. Cardiac computed tomography (CT) is a painless test that uses an x-ray machine to take clear, detailed pictures of your heart. For further information please visit [email or web address]. Please follow instruction sheet as given.    Follow-Up: At [facility], you and your health needs are our priority.  As part of our continuing mission to provide you with exceptional heart care, we have created designated Provider Care Teams.  These Care Teams include your primary Cardiologist (physician) and Advanced Practice Providers (APPs -  Physician Assistants and Nurse Practitioners) who all work together to provide you with the care you need, when you need it.  We recommend signing up for the patient portal called "MyChart".  Sign up information is provided on this After Visit Summary.   MyChart is used to connect with patients for Virtual Visits (Telemedicine).  Patients are able to view lab/test results, encounter notes, upcoming appointments, etc.  Non-urgent messages can be sent to your provider as well.   To learn more about what you can do with MyChart, go to [email or web address].    Your next appointment:   Follow up after CT- call when scheduled.   The format for your next appointment:   Virtual Visit   Provider:   Glenetta Hew, MD   Other Instructions  Your cardiac CT will be scheduled at one of the below locations:   [facility] [address] [place], [place] [address] [phone]  If scheduled at [facility], please arrive at the [facility] main entrance of [facility] 30 minutes prior to test start time. Proceed to the [facility] Radiology Department (first floor) to check-in and test prep.  Please follow these instructions carefully (unless otherwise directed):  Hold all erectile dysfunction medications at least 3 days (72 hrs) prior to test.  On the Night Before the Test: . Be sure to Drink plenty of water. . Do not consume any caffeinated/decaffeinated beverages or chocolate 12 hours prior to your test. . Do not take any antihistamines 12 hours prior to your test.  On the Day of the Test: . Drink plenty of water. Do not drink any water within one hour of the test. . Do not eat any food 4 hours prior to the test. . You may take your regular medications prior to the test.  . Take metoprolol (  Lopressor) two hours prior to test. . HOLD Furosemide/Hydrochlorothiazide morning of the test. . FEMALES- please wear underwire-free bra if available       After the Test: . Drink plenty of water. . After receiving IV contrast, you may experience a mild flushed feeling. This is normal. . On occasion, you may experience a mild rash up to 24 hours after the test. This is not dangerous. If this occurs, you can take  Benadryl 25 mg and increase your fluid intake. . If you experience trouble breathing, this can be serious. If it is severe call 911 IMMEDIATELY. If it is mild, please call our office. . If you take any of these medications: Glipizide/Metformin, Avandament, Glucavance, please do not take 48 hours after completing test unless otherwise instructed.   Once we have confirmed authorization from your insurance company, we will call you to set up a date and time for your test. Based on how quickly your insurance processes prior authorizations requests, please allow up to 4 weeks to be contacted for scheduling your Cardiac CT appointment. Be advised that routine Cardiac CT appointments could be scheduled as many as 8 weeks after your provider has ordered it.  For non-scheduling related questions, please contact the cardiac imaging nurse navigator should you have any questions/concerns: Marchia Bond, Cardiac Imaging Nurse Navigator Burley Saver, Interim Cardiac Imaging Nurse [facility] Direct Office Dial: [phone]   For scheduling needs, including cancellations and rescheduling, please call Vivien Rota at [phone], option 3.

## 2019-09-23 ENCOUNTER — Encounter: Payer: Self-pay | Admitting: Cardiology

## 2019-09-23 ENCOUNTER — Telehealth: Payer: Medicare Other | Admitting: Cardiology

## 2019-09-23 NOTE — Assessment & Plan Note (Signed)
She does have borderline severe aortic stenosis, but has not had symptoms of anything until now.  The plan was to recheck echocardiogram in February, but now that she had symptoms, which have to exclude progression of disease.  Plan: Recheck 2D echo

## 2019-09-23 NOTE — Assessment & Plan Note (Signed)
She is quite anxious today and her blood pressure is actually better than it has been.  I have asked that she take an extra dose of chlorthalidone today and tomorrow.  We may need to consider calcium channel blocker if pressures continue elevated.  For now we will continue current dose of carvedilol (not able to titrate further because of bradycardia), and Micardis along with chlorthalidone.

## 2019-09-23 NOTE — Assessment & Plan Note (Signed)
Minimal disease back in 2017, but now with a significant episode of chest pain.  She does have risk factors of obesity, hypertension hyperlipidemia as well as diabetes.  It is quite possible that she could have had acute coronary syndrome type of plaque rupture or plaque hemorrhage leading to progression of disease.  If she has any more symptoms, would consider adding as needed nitroglycerin.  Question if this is truly anginal symptoms that she is having as it is not related to her valve or coronary disease.  Need to determine extent of disease  Plan: Continue carvedilol and atorvastatin along with ARB.  Evaluate aortic valve and also for any regional wall motion normalities with rhythm 2D echo  Ischemic evaluation with coronary CT angiogram

## 2019-09-23 NOTE — Assessment & Plan Note (Addendum)
Pretty significant chest discomfort noted that occurred at rest.  It woke her up from sleep pressure and squeezing sounding.  Some very typical features.  The atypical features of that she has not had similar symptoms with exertion since.  Would be very leery of doing a treadmill stress test given her aortic stenosis and arthritis symptoms.  Myoview would not be effective given her body habitus.  At this point we want to exclude significant CAD and also progression of aortic valve disease.  Plan: Recheck 2D echocardiogram and coronary CT angiogram  -> If there is no evidence of worsening CAD or aortic stenosis, would consider the possibility of this being done really bad case of costochondritis.

## 2019-09-23 NOTE — Assessment & Plan Note (Signed)
Check lipids earlier this month and her LDL is excellent.  For now, given concerns about possible worsening coronary disease, will continue on current dose of statin. ->  Pending results, we may be to consider cutting down a little bit

## 2019-09-28 ENCOUNTER — Other Ambulatory Visit: Payer: Self-pay | Admitting: Internal Medicine

## 2019-10-01 ENCOUNTER — Ambulatory Visit (HOSPITAL_COMMUNITY): Payer: Medicare Other | Attending: Cardiology

## 2019-10-01 ENCOUNTER — Other Ambulatory Visit: Payer: Self-pay

## 2019-10-01 DIAGNOSIS — I350 Nonrheumatic aortic (valve) stenosis: Secondary | ICD-10-CM | POA: Insufficient documentation

## 2019-10-01 DIAGNOSIS — I208 Other forms of angina pectoris: Secondary | ICD-10-CM | POA: Diagnosis not present

## 2019-10-01 DIAGNOSIS — I2510 Atherosclerotic heart disease of native coronary artery without angina pectoris: Secondary | ICD-10-CM | POA: Diagnosis not present

## 2019-10-01 LAB — ECHOCARDIOGRAM COMPLETE
AR max vel: 0.87 cm2
AV Area VTI: 0.93 cm2
AV Area mean vel: 0.77 cm2
AV Mean grad: 34.5 mmHg
AV Peak grad: 57.2 mmHg
Ao pk vel: 3.78 m/s
Area-P 1/2: 3.42 cm2
S' Lateral: 2.5 cm

## 2019-10-02 LAB — BASIC METABOLIC PANEL
BUN/Creatinine Ratio: 29 — ABNORMAL HIGH (ref 12–28)
BUN: 31 mg/dL — ABNORMAL HIGH (ref 8–27)
CO2: 26 mmol/L (ref 20–29)
Calcium: 10.2 mg/dL (ref 8.7–10.3)
Chloride: 105 mmol/L (ref 96–106)
Creatinine, Ser: 1.07 mg/dL — ABNORMAL HIGH (ref 0.57–1.00)
GFR calc Af Amer: 57 mL/min/{1.73_m2} — ABNORMAL LOW (ref >59)
GFR calc non Af Amer: 49 mL/min/{1.73_m2} — ABNORMAL LOW (ref >59)
Glucose: 193 mg/dL — ABNORMAL HIGH (ref 65–99)
Potassium: 5.1 mmol/L (ref 3.5–5.2)
Sodium: 140 mmol/L (ref 134–144)

## 2019-10-08 ENCOUNTER — Telehealth (HOSPITAL_COMMUNITY): Payer: Self-pay | Admitting: Emergency Medicine

## 2019-10-08 NOTE — Telephone Encounter (Signed)
Attempted to call patient regarding upcoming cardiac CT appointment. °Left message on voicemail with name and callback number °Sentoria Brent RN Navigator Cardiac Imaging °[facility] °[phone] Office °[phone] Cell ° °

## 2019-10-08 NOTE — Telephone Encounter (Signed)
Pt returning phone call regarding upcoming cardiac imaging study; pt verbalizes understanding of appt date/time, parking situation and where to check in, pre-test NPO status and medications ordered, and verified current allergies; name and call back number provided for further questions should they arise Mordche Hedglin RN Navigator Cardiac Imaging [facility] [phone] office [phone] cell   

## 2019-10-09 ENCOUNTER — Ambulatory Visit (HOSPITAL_COMMUNITY)
Admission: RE | Admit: 2019-10-09 | Discharge: 2019-10-09 | Disposition: A | Discharge status: Home / Self Care | Payer: Medicare Other | Source: Ambulatory Visit | Attending: Cardiology | Admitting: Cardiology

## 2019-10-09 ENCOUNTER — Other Ambulatory Visit: Payer: Self-pay

## 2019-10-09 DIAGNOSIS — I208 Other forms of angina pectoris: Secondary | ICD-10-CM | POA: Insufficient documentation

## 2019-10-09 HISTORY — PX: OTHER SURGICAL HISTORY: SHX169

## 2019-10-09 IMAGING — CT CT HEART MORP W/ CTA COR W/ SCORE W/ CA W/CM &/OR W/O CM
4 of 7 series · 8 of 20 positions shown, 9 images · IV contrast (APPLIED)
Comparison: None.
COMPARISON: None.

Addendum:
EXAM:
OVER-READ INTERPRETATION  CT CHEST

The following report is an over-read performed by radiologist Dr.
DILFREDO [REDACTED] on [DATE]. This
over-read does not include interpretation of cardiac or coronary
anatomy or pathology. The coronary calcium score/coronary CTA
interpretation by the cardiologist is attached.
HISTORY: 79 yo female with chest pain/anginal equiv, 10yr CHD risk 10-20%,
not treadmill candidate
Cardiac/Coronary CTA
TECHNIQUE: The patient was scanned on a Siemens Force scanner.
PROTOCOL: A 120 kV prospective scan was triggered in the descending thoracic
aorta at 111 HU's. Axial non-contrast 3 mm slices were carried out
through the heart. The data set was analyzed on a dedicated work
station and scored using the Agatson method. Gantry rotation speed
was 250 msecs and collimation was .6 mm. Beta blockade and 0.8 mg of
sl NTG was given. The 3D data set was reconstructed in 5% intervals
of the 67-82 % of the R-R cycle. Diastolic phases were analyzed on a
dedicated work station using MPR, MIP and VRT modes. The patient
received 80mL OMNIPAQUE IOHEXOL 350 MG/ML SOLN of contrast.

[Series 6: best diast 74 % · axial · 0.37mm/px · z∈[-153,-118]mm · 2 of 261 slices shown]
[im 87/261  vessel]
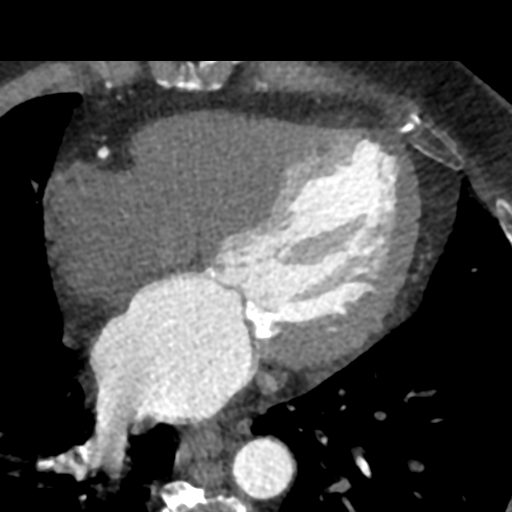
[im 174/261  vessel]
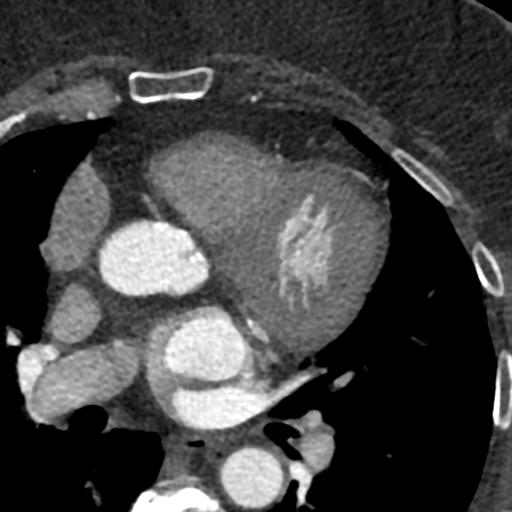

[Series 7: best syst · axial · 0.37mm/px · z∈[-153,-118]mm · 2 of 261 slices shown, 3 images]
[im 87/261  vessel]
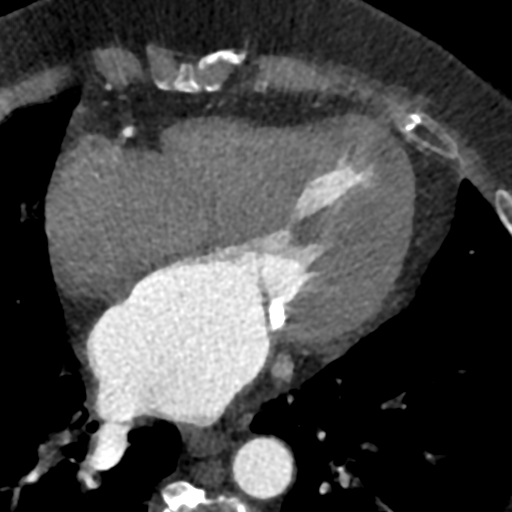
[im 87/261  lung]
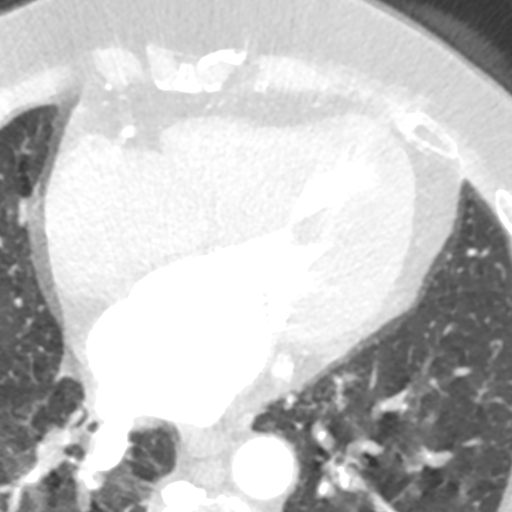
[im 174/261  vessel]
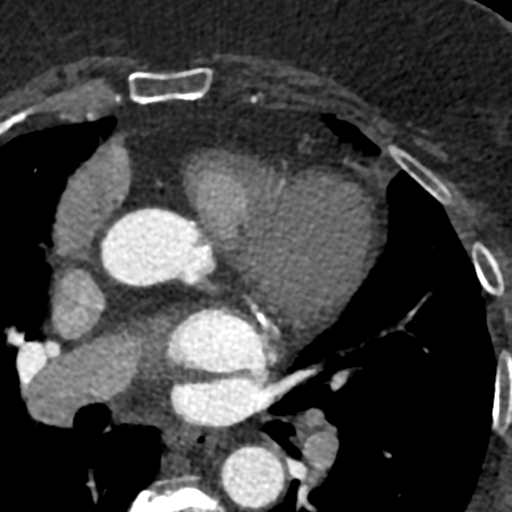

[Series 9: ts diast sharp 74 % · axial · 0.37mm/px · z∈[-153,-118]mm · 2 of 261 slices shown]
[im 87/261  lung]
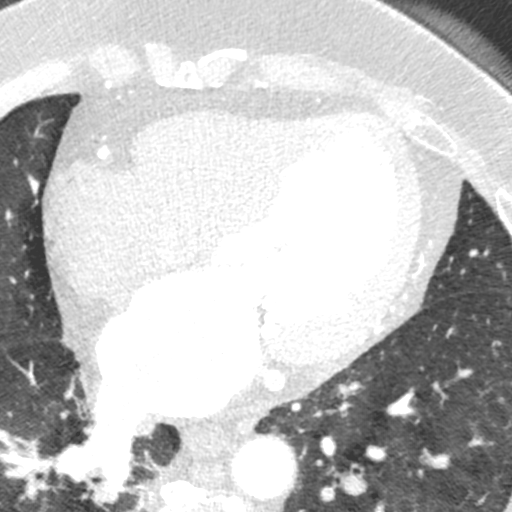
[im 174/261  lung]
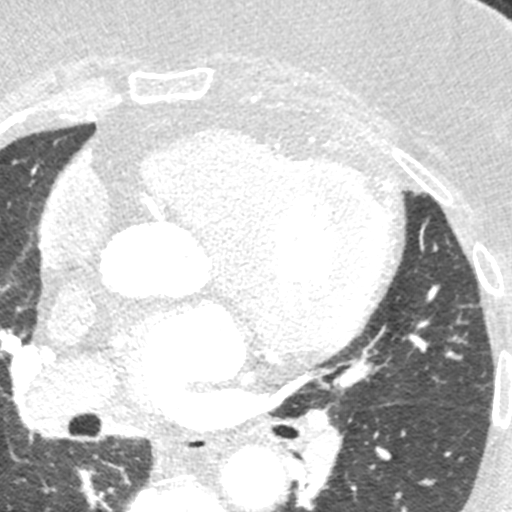

[Series 10: ts syst sharp 35 % · axial · 0.37mm/px · z∈[-153,-118]mm · 2 of 261 slices shown]
[im 87/261  lung]
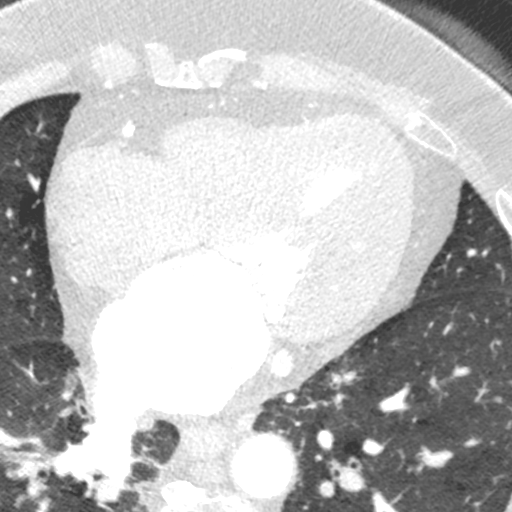
[im 174/261  lung]
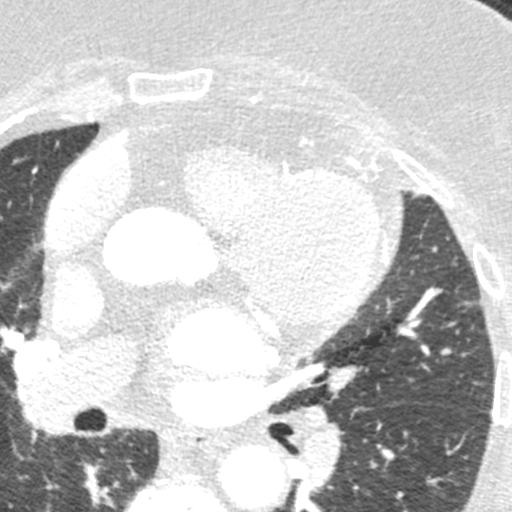

[8 of 20 positions shown; findings below may reference images not displayed]

FINDINGS: Aortic atherosclerosis. Within the visualized portions of the thorax
there are no suspicious appearing pulmonary nodules or masses, there
is no acute consolidative airspace disease, no pleural effusions, no
pneumothorax and no lymphadenopathy. Visualized portions of the
upper abdomen are unremarkable. There are no aggressive appearing
lytic or blastic lesions noted in the visualized portions of the
skeleton.
IMPRESSION: 1.  Aortic Atherosclerosis ([OE]-[OE]).
FINDINGS: Quality: Fair, HR 59

Coronary calcium score: The patient's coronary artery calcium score
is 624, which places the patient in the 82nd percentile.

Coronary arteries: Normal coronary origins.  Right dominance.

Right Coronary Artery: Dominant. Gives off large R-PDA and R-PLB
branches. Mild 25-49% proximal mixed stenosis (CADRADS 2). No
significant distal or branch disease.

Left Main Coronary Artery: Normal left main. Bifurcates into the LAD
and LCx branches.

Left Anterior Descending Coronary Artery: Moderate sized anterior
artery which gives off a mid-vessel diagonal branch. There is
diffuse calcified plaque with mild 25-49% proximal stenosis
([OE]) and minimal mixed mid to distal vessel stenosis (<25% -
[OE]).

Ramus intermedius: Small branch <2.0 mm, mild to moderate ostial
disease

Left Circumflex Artery: AV groove vessel. There is a moderate mixed
50-69% proximal stenosis ([OE]). Small distal OM branch without
disease.

Aorta: Normal size, 30 mm at the mid ascending aorta (level of the
PA bifurcation) measured double oblique. Aortic atherosclerosis. No
dissection.

Aortic Valve: Trileaflet with leaflet and annular calcification.
Probable aortic stenosis.

Other findings:

Normal pulmonary vein drainage into the left atrium.

Normal left atrial appendage without a thrombus.

Dilated main pulmonary artery to 28 mm, suggestive of pulmonary
hypertension.

Mitral annular calcification
IMPRESSION: 1. Diffuse multivessel mixed CAD, possibly significant in the
proximal LCx, CADRADS = 3. CT FFR will be performed and reported
separately.

2. Coronary calcium score of 624. This was 82nd percentile for age
and sex matched control.

3. Normal coronary origin with right dominance.

4. Dilated main pulmonary artery to 28 mm, suggestive of pulmonary
hypertension.

5. Mitral annular calcification

6. Aortic annular and leaflet calcification, probable aortic
stenosis

*** End of Addendum ***
EXAM:
OVER-READ INTERPRETATION  CT CHEST

The following report is an over-read performed by radiologist Dr.
DILFREDO [REDACTED] on [DATE]. This
over-read does not include interpretation of cardiac or coronary
anatomy or pathology. The coronary calcium score/coronary CTA
interpretation by the cardiologist is attached.
FINDINGS: Aortic atherosclerosis. Within the visualized portions of the thorax
there are no suspicious appearing pulmonary nodules or masses, there
is no acute consolidative airspace disease, no pleural effusions, no
pneumothorax and no lymphadenopathy. Visualized portions of the
upper abdomen are unremarkable. There are no aggressive appearing
lytic or blastic lesions noted in the visualized portions of the
skeleton.
IMPRESSION: 1.  Aortic Atherosclerosis ([OE]-[OE]).

## 2019-10-09 MED ORDER — NITROGLYCERIN 0.4 MG SL SUBL
SUBLINGUAL_TABLET | SUBLINGUAL | Status: AC
  Filled 2019-10-09: qty 2

## 2019-10-09 MED ORDER — IOHEXOL 350 MG/ML SOLN
80.0000 mL | Freq: Once | INTRAVENOUS | Status: AC | PRN
  Administered 2019-10-09: 80 mL via INTRAVENOUS

## 2019-10-09 MED ORDER — NITROGLYCERIN 0.4 MG SL SUBL
0.8000 mg | SUBLINGUAL_TABLET | Freq: Once | SUBLINGUAL | Status: AC
  Administered 2019-10-09: 0.8 mg via SUBLINGUAL

## 2019-10-10 DIAGNOSIS — Z6841 Body Mass Index (BMI) 40.0 and over, adult: Secondary | ICD-10-CM

## 2019-10-10 DIAGNOSIS — I208 Other forms of angina pectoris: Secondary | ICD-10-CM | POA: Diagnosis not present

## 2019-10-15 ENCOUNTER — Telehealth: Payer: Self-pay | Admitting: *Deleted

## 2019-10-15 NOTE — Telephone Encounter (Signed)
-----   Message from [name], MD sent at 10/02/2019  9:57 PM EDT ----- Echocardiogram results: Overall the pump function looks great.  Only grade 1 diastolic dysfunction which probably does not explain the level of shortness of breath.  The aortic valve gradient has gone up a little bit but is not yet at the category that would consider to be severe.  It is getting closer.  We should probably recheck in 6 months.  Bryan Lemma, MD

## 2019-10-15 NOTE — Telephone Encounter (Signed)
Left message for patient to call back for results.  

## 2019-10-15 NOTE — Telephone Encounter (Signed)
-----   Message from [name], MD sent at 10/02/2019  9:59 PM EDT ----- Labs look okay for CT scan.  Looks a little bit dry.  Would recommend increasing fluid hydration for the day or 2 leading up to planned CT scan.  And then continue to drink extra fluids for the next 2 to 3 days post CT scan.  This means 8-10 8 ounce glasses of water a day. -   Hold Chlorthalidone on the day before, day of & day after CT Scan.  Bryan Lemma, MD

## 2019-10-15 NOTE — Telephone Encounter (Signed)
-----   Message from [name], MD sent at 10/12/2019 11:25 PM EDT ----- Coronary calcium score-CT angiogram: --Calcium score is elevated at 624 --> Coronary CTA shows mild proximal RCA disease.  Mild diffuse proximal LAD with mild distal LAD.  LCx has moderate 50 to 60% stenosis (FFR Not significant). Aortic Valve stenosis as noted.   Reassuring results - no "SIGNIFICANT" CAD cholesterol plaque lesions - but there is indeed a Moderate amount of CAD -- need to continue treating Risk Factors.   Would not explain her SOB Sx & the episode of Chest Pain prior to last visit.Bryan Lemma, MD

## 2019-10-27 NOTE — Telephone Encounter (Signed)
Patient returning call.

## 2019-10-27 NOTE — Telephone Encounter (Signed)
Called patient back, gave CT results- and ECHO results.  Patient verbalized understanding.  Advised I would make RN aware and if anything else was needed she would call her.  No questions at this time.

## 2019-10-28 ENCOUNTER — Other Ambulatory Visit: Payer: Self-pay | Admitting: Internal Medicine

## 2019-11-28 ENCOUNTER — Ambulatory Visit: Payer: Medicare Other

## 2019-12-06 ENCOUNTER — Ambulatory Visit (INDEPENDENT_AMBULATORY_CARE_PROVIDER_SITE_OTHER): Payer: Medicare Other

## 2019-12-06 DIAGNOSIS — Z23 Encounter for immunization: Secondary | ICD-10-CM

## 2019-12-21 ENCOUNTER — Other Ambulatory Visit: Payer: Self-pay | Admitting: Internal Medicine

## 2019-12-23 ENCOUNTER — Telehealth: Payer: Self-pay | Admitting: *Deleted

## 2019-12-23 NOTE — Telephone Encounter (Signed)
RN called and confirmed with patient for upcoming virtual visit 12/25/19 and instruction given  Patient verbalized understanding.

## 2019-12-25 ENCOUNTER — Telehealth (INDEPENDENT_AMBULATORY_CARE_PROVIDER_SITE_OTHER): Payer: Medicare Other | Admitting: Cardiology

## 2019-12-25 ENCOUNTER — Encounter: Payer: Self-pay | Admitting: Cardiology

## 2019-12-25 ENCOUNTER — Telehealth: Payer: Self-pay | Admitting: *Deleted

## 2019-12-25 VITALS — BP 158/66 | HR 63 | Ht 61.0 in

## 2019-12-25 DIAGNOSIS — I2510 Atherosclerotic heart disease of native coronary artery without angina pectoris: Secondary | ICD-10-CM

## 2019-12-25 DIAGNOSIS — I350 Nonrheumatic aortic (valve) stenosis: Secondary | ICD-10-CM | POA: Diagnosis not present

## 2019-12-25 DIAGNOSIS — I272 Pulmonary hypertension, unspecified: Secondary | ICD-10-CM

## 2019-12-25 DIAGNOSIS — E785 Hyperlipidemia, unspecified: Secondary | ICD-10-CM | POA: Diagnosis not present

## 2019-12-25 DIAGNOSIS — I5032 Chronic diastolic (congestive) heart failure: Secondary | ICD-10-CM

## 2019-12-25 DIAGNOSIS — R079 Chest pain, unspecified: Secondary | ICD-10-CM

## 2019-12-25 DIAGNOSIS — I10 Essential (primary) hypertension: Secondary | ICD-10-CM | POA: Diagnosis not present

## 2019-12-25 NOTE — Telephone Encounter (Signed)
Called  To start virtual visit  1 st attempt. Patient did not answer went to voice mail will tray again

## 2019-12-25 NOTE — Progress Notes (Signed)
Virtual Visit via Telephone Note   This visit type was conducted due to national recommendations for restrictions regarding the COVID-19 Pandemic (e.g. social distancing) in an effort to limit this patient's exposure and mitigate transmission in our community.  Due to her co-morbid illnesses, this patient is at least at moderate risk for complications without adequate follow up.  This format is felt to be most appropriate for this patient at this time.  The patient did not have access to video technology/had technical difficulties with video requiring transitioning to audio format only (telephone).  All issues noted in this document were discussed and addressed.  No physical exam could be performed with this format.  Please refer to the patient's chart for her  consent to telehealth for [facility].   Patient has given verbal permission to conduct this visit via virtual appointment and to bill insurance 12/27/2019 10:25 PM     Evaluation Performed:  Follow-up visit  Date:  12/27/2019   ID:  Mary Mccarthy, DOB October 31, 1940, MRN [id number]  Patient Location: Home Provider Location: Home Office  PCP:  Biagio Borg, MD  Cardiologist:  Glenetta Hew, MD  Electrophysiologist:  None   Chief Complaint:   Chief Complaint  Patient presents with  . Follow-up    3 months -- study results  . Chest Pain    No further episodes  . Aortic Stenosis    - stable  . Coronary Artery Disease    CCTA results - no -occlusive   History of Present Illness:    Mary Mccarthy is a 79 y.o. female with PMH notable for history of SVT, MODERATE-SEVERE AORTIC STENOSIS, HTN, PULMONARY HTN w/ MORBID OBESITY who presents via audio/video conferencing for a telehealth visit today as a 48-monthfollow-up. Family History: Is notable for the fact that her sister (Mary Mccarthy was a well-known patient to our practice who was a patient of Dr. CSallyanne Kuster-. She had significant CAD and arrhythmia issues - died N12/26/19 Cardiac Cath in April 2017 showed mild-moderate nonobstructive CAD (confirmed on CCTA) with Moderate Aortic Stenosis on Echo -> most recently checked in February 21.  Problem List Items Addressed This Visit    Moderate to severe aortic stenosis - Primary (Chronic)   Hyperlipidemia with target LDL less than 70 (Chronic)   Essential hypertension (Chronic)   Chronic diastolic heart failure (HCC) (Chronic)   Coronary artery disease, non-occlusive (Chronic)     Mary Shonewas last seen on 09/22/2019 after a prolonged episode of chest pain roughly 1 week prior to her clinic visit.  Starting around 4 AM TAVR face neck her chest involving jaw and shoulders.  Back pain lasted 30 minutes, but overall symptoms lessened.  Was too scared to go to the ER because of Covid.  She slept in the soft tissues too scared to lay flat.  She felt her heart pounding and racing out of her chest. ->  No further episodes.  She does note exertional dyspnea doing vacuuming and other strenuous activities.  Daily weights she does not hurry.  Sometimes occasionally associated with shortness of breath and chest tightness.  Plan: Echocardiogram to assess progression of Aortic Stenosis; Coronary CTA.  Hospitalizations:  . None  Recent - Interim CV studies:   The following studies were reviewed today: . TTE 10/01/2019: EF 65 to 70%.  Normal RWMA, G1 DD-elevated LAP.  Mildly elevated PAP.  Moderate LA dilation.  Moderate aortic valve stenosis.  (Mean gradient 34.5). ->  Probably  still stable.  2019 mean gradient was 36, then reduced to 31 this February 21 -> now 34.5. o Plan was to probably recheck echo in 6 to 12 months. . CORONARY CALCIUM SCORE-CTA 10/09/2019: Calcium score 624.  82nd percentile. Dominant RCA: Mild (25-49%) proximal stenosis-distal bifurcation into PDA and PAV--< RPL branches.  LAD (1 major mid vessel diagonal) diffuse calcified plaque, mild proximal stenosis with minimal distal stenosis.  Small RI moderate  ostial disease.  LCx-moderate mixed (50-69%) proximal stenosis.  Small distal OM1 disease.  Trileaflet aortic valve annular calcification of probable stenosis.  Inerval History   Mary Mccarthy disease in close follow-up today.  She is doing quite well.  She says he is doing great she is not having any problems with further chest pain.  No resting or exertional dyspnea.  She is not all at active, does not get out and about to do a lot, but does chores around the house and has no problems with chest discomfort or dyspnea symptoms. She says every now and then she will get swelling in her legs but it usually goes down which puts her feet up.  She notes this if she has been up on her feet for long periods of time, or if she has been drinking too much water.  She is try to figure out a balanced treatment with water drinking and how much she gets well. Her systolic blood pressures usually run in the 130-140 mmHg range, but says that whenever she thinks she may be seeing or talking to a doctor or NP/PA, her pressures get higher.  The pressure today was a little high from her baseline.  She really does not usually check her blood pressure, she feels fine.  Cardiovascular ROS: no chest pain or dyspnea on exertion positive for - edema negative for - irregular heartbeat, orthopnea, palpitations, paroxysmal nocturnal dyspnea, rapid heart rate, shortness of breath or Syncope/near syncope, TIA/amaurosis fugax or claudication   ROS:  Please see the history of present illness.    The patient does not have symptoms concerning for COVID-19 infection (fever, chills, cough, or new shortness of breath).  ->  Recent Cold, doing better Review of Systems  Constitutional: Negative for malaise/fatigue and weight loss.  HENT: Negative for congestion and nosebleeds.   Respiratory: Negative for shortness of breath.        Recent Cold - resolved.  Cardiovascular:       Per HPI  Gastrointestinal: Negative for blood in stool  and melena.  Genitourinary: Negative for hematuria.  Musculoskeletal: Positive for joint pain (mild). Negative for back pain (not as bad; sleeping better).  Neurological: Negative for dizziness, focal weakness and headaches.       Off & on off balance  Psychiatric/Behavioral: Negative for depression (doing better) and memory loss. The patient is not nervous/anxious and does not have insomnia (sleeping better - not waking up more.  feeling better).     Notably improved symptoms of anxiety and grieving: Previously, she was quite upset about how things ended between her and her husband prior to his death (in his dying days, he was very mean and sensitive very hard for things.)  The patient is practicing social distancing.  In fact she is very anxious about this.  Past Medical History:  Diagnosis Date  . ALLERGIC RHINITIS 06/24/2007  . Anemia   . AVM (arteriovenous malformation) of colon   . Blood transfusion without reported diagnosis 05/2015  . COLONIC POLYPS, HX OF 06/24/2007  .  Coronary artery disease, non-occlusive 05/2015   Trop + w/ Acute Anemia =>CATH: small RI - Ostial 60%, ostial RCA 30% and dLAD 40-50%;; 9/'21: Cor Ca Score 624. Mild (25-49%) prox RCA & LAD,; Moderate (50-69%) Ostial Small RI & prox LCx.    Marland Kitchen DEPRESSION 10/08/2006  . DIABETES MELLITUS, TYPE II 10/05/2006  . GERD 06/24/2007  . HYPERLIPIDEMIA 10/08/2006  . HYPERTENSION 10/08/2006  . INSOMNIA 09/24/2008  . Left knee DJD   . Moderate to severe aortic stenosis 05/2015   a) Mod AS (mean gras 22 mmHg, peak 42 mmHg -AVA 1.1-1.2 cm.);b) 04/2017: Severe Ca2+ AoV w/ Mod-Severe AS (p-m grad 60-34 mmHg -Moderate), calculated AVA 0.8 cm -Severe;;c) 03/2019: Mod AS (m grad 31 mmHg); d) 09/2019: Mod AS (m grad 34.5 mmHg).  . Myocardial infarction ([place]) 05/2015   Type 2 MI - related to GI Bleed / Anemia (non-obstructive CAD by Cath)  . Osteoporosis 03/10/2016  . PEPTIC ULCER DISEASE 10/08/2006   Past Surgical History:  Procedure Laterality  Date  . APPENDECTOMY    . BREAST BIOPSY    . CARDIAC CATHETERIZATION N/A 05/21/2015   Procedure: Left Heart Cath and Coronary Angiography;  Surgeon: Leonie Man, MD;  Location: [facility];  Service: Cardiovascular;: Ost RI 60%, Ost RCA 30%, dLAD tapers to small vessel w/ 40-50%. Mildly elevated LVEDP. Normal LV Fxn.  . COLONOSCOPY N/A 05/20/2015   Procedure: COLONOSCOPY;  Surgeon: Manus Gunning, MD;  Location: [facility];  Service: Gastroenterology;  Laterality: N/A;  . CORONARY CA2+ SCORE / CARDIAC CT ANGIOGRAM  10/09/2019   Calcium score 624.  82nd percentile. Dominant RCA: Mild (25-49%) proximal stenosis-distal bifurcation into PDA and PAV--< RPL branches.  LAD (1 major mid vessel diagonal) diffuse calcified plaque, mild proximal stenosis with minimal distal stenosis.  Small RI moderate ostial disease.  LCx-moderate mixed (50-69%) proximal stenosis.  Small dOM1 disease.  Trileaflet AoV, annular Ca2+ - probable AS  . ESOPHAGOGASTRODUODENOSCOPY N/A 05/20/2015   Procedure: ESOPHAGOGASTRODUODENOSCOPY (EGD);  Surgeon: Manus Gunning, MD;  Location: [facility];  Service: Gastroenterology;  Laterality: N/A;  . TONSILLECTOMY    . TRANSTHORACIC ECHOCARDIOGRAM  03/2019; 09/2019   a) EF 60 to 65%.  Moderate LVH.  GRII DD.  Mod-Severe AS (m grad 36 mmHg, peak 59 mmHg); b) EF 65-70%, No RWMA. Gr1 DD/hi LAP, Mild hi PAP. Mod LA Dil. MOD AS (mean Grad 34.5 mmHg).  STABLE   . TRANSTHORACIC ECHOCARDIOGRAM  04/12/2017   EF 60-65%. No RWMA.  GR 1 DD.  Moderate concentric LVH.  Mild LA dilation. Severe calcified aortic valve with moderate-severe aortic stenosis (peak/mean gradients 60/34 mmHg) and in severe range by AVA (0.8 cm2).  Mild to moderately increased PA pressures 41 mmHg with normal RV size and function..    . TUBAL LIGATION       Current Meds  Medication Sig  . acetaminophen (TYLENOL) 500 MG tablet Take 500 mg by mouth every 6 (six) hours as needed for moderate pain.  Marland Kitchen  aspirin EC 81 MG tablet Take 81 mg by mouth. Take 81 mg tablet  On Monday , Wednesday , Friday and Saturday  . atorvastatin (LIPITOR) 40 MG tablet Take 1 tablet (40 mg total) by mouth daily.  . blood glucose meter kit and supplies Dispense based on patient and insurance preference. Use up to four times daily as directed. E11.9  . Blood Glucose Monitoring Suppl (ONE TOUCH ULTRA 2) w/Device KIT Use as directed daily  . carvedilol (COREG)  6.25 MG tablet Take 1 tablet (6.25 mg total) by mouth 2 (two) times daily.  . chlorthalidone (HYGROTON) 25 MG tablet TAKE 1 TABLET BY MOUTH EVERY DAY  . clonazePAM (KLONOPIN) 0.5 MG tablet TAKE 1 TABLET BY MOUTH TWICE A DAY AS NEEDED FOR ANXIETY  . [employer] VITAMIN B12 1000 MCG tablet TAKE 1 TABLET BY MOUTH EVERY DAY  . glimepiride (AMARYL) 2 MG tablet TAKE 1 TABLET BY MOUTH DAILY BEFORE BREAKFAST.  Marland Kitchen Lancets (ONETOUCH DELICA PLUS [id number]) MISC USE AS DIRECTED TEST 1-2 TIMES A DAY  . metFORMIN (GLUCOPHAGE) 1000 MG tablet TAKE 1 TAB BY MOUTH IN THE AM,AND 1/2 TAB IN THE PM.  . ONETOUCH ULTRA test strip USE AS INSTRUCTED TEST 1-2 TIMES A DAY  . pantoprazole (PROTONIX) 40 MG tablet TAKE 1 TABLET BY MOUTH EVERY DAY  . telmisartan (MICARDIS) 80 MG tablet Take 1 tablet (80 mg total) by mouth daily.     Allergies:   Ibuprofen   Social History   Tobacco Use  . Smoking status: Never Smoker  . Smokeless tobacco: Never Used  Substance Use Topics  . Alcohol use: No    Alcohol/week: 0.0 standard drinks  . Drug use: No     Family Hx: The patient's family history includes Alcohol abuse in an other family member; Arrhythmia in her sister; Arthritis in an other family member; CAD in her sister; Cancer in her mother; Depression in an other family member; Diabetes in an other family member; Heart attack (age of onset: 3) in her sister; Heart disease in an other family member; Hyperlipidemia in an other family member; Hypertension in an other family member; Stroke in an other  family member.   Labs/Other Tests and Data Reviewed:    EKG:  No ECG reviewed.  Recent Labs: 09/11/2019: ALT 14; Hemoglobin 11.5; Platelets 174.0; TSH 4.66 10/01/2019: BUN 31; Creatinine, Ser 1.07; Potassium 5.1; Sodium 140   Recent Lipid Panel Lab Results  Component Value Date/Time   CHOL 87 09/11/2019 09:44 AM   TRIG 59.0 09/11/2019 09:44 AM   TRIG 90 12/01/2005 01:30 PM   HDL 36.40 (L) 09/11/2019 09:44 AM   CHOLHDL 2 09/11/2019 09:44 AM   LDLCALC 39 09/11/2019 09:44 AM    Wt Readings from Last 3 Encounters:  09/22/19 215 lb (97.5 kg)  09/16/19 219 lb (99.3 kg)  08/21/19 217 lb (98.4 kg)     Objective:    Vital Signs:  BP (!) 158/66   Pulse 63   Ht _0  (1.549 m)   BMI 40.62 kg/m   VITAL SIGNS:  reviewed Pleasant female in no acute distress. A&O x 3.  normal Mood & Affect Non-labored respirations   ASSESSMENT & PLAN:    Problem List Items Addressed This Visit    Moderate to severe aortic stenosis - Primary (Chronic)    It did appear that her last echo showed slight progression of disease, however we just rechecked and the gradient appears to be pretty stable.  Somewhere between 31-36 mmHg.  Not yet to the point of being severe.  She is no longer having any chest discomfort. We can probably recheck echo in August 2022.  Discussed warning sign symptoms.      Relevant Medications   aspirin EC 81 MG tablet   Other Relevant Orders   ECHOCARDIOGRAM COMPLETE   Hyperlipidemia with target LDL less than 70 (Chronic)    Lipids from August look great.  Continue to monitor.  Low threshold to consider reducing statin  dose.      Relevant Medications   aspirin EC 81 MG tablet   Essential hypertension (Chronic)    She tells me that her blood pressures usually are in the 135 to 145 mmHg range.  I have asked that she monitor her pressures and let us know what the average pressures are. She is already on max dose of ARB along with chlorthalidone and moderate dose  carvedilol.  Would be reluctant to titrate beta-blocker further because of borderline bradycardia.  I do not really want to add a new medication.  We will continue to monitor as we are trying to avoid orthostatic hypotension.         Relevant Medications   aspirin EC 81 MG tablet   Pulmonary hypertension (HCC) (Chronic)    Mildly elevated pressures by most recent echo.  I suspect this is partly related to the LV filling pressures and therefore related to aortic stenosis and diastolic dysfunction.  Continue to treat systemic hypertension.      Relevant Medications   aspirin EC 81 MG tablet   Chronic diastolic heart failure (HCC) (Chronic)    Seems to be relatively stable.  She has a chronic edema but nothing overly worrisome to her.  She is not on loop diuretic, and is on thiazide diuretic.  Is on good dose of ARB and beta-blocker.  Class I-II symptoms.      Relevant Medications   aspirin EC 81 MG tablet   Other Relevant Orders   ECHOCARDIOGRAM COMPLETE   Coronary artery disease, non-occlusive (Chronic)    Coronary calcium score was 624 which is still moderately high risk therefore we should continue to treat risk factors of hypertension hyperlipidemia as well as glycemic control.  However thankfully, coronary CTA did not show any evidence of significant obstructive disease.  This goes along with her prior cath.  Unlikely that CAD was a cause for her episode of chest pain.      Relevant Medications   aspirin EC 81 MG tablet   Other Relevant Orders   ECHOCARDIOGRAM COMPLETE   Chest pain at rest    Chest pain of episode prior to last visit.  Has not had any further symptoms since.  I suspect this is probably musculoskeletal in nature.  She was evaluated with a coronary CTA that showed no evidence of obstructive disease.         COVID-19 Education: The signs and symptoms of COVID-19 were discussed with the patient and how to seek care for testing (follow up with PCP or arrange  E-visit).   The importance of social distancing was discussed today.  Time:   Today, I have spent 15 minutes with the patient with telehealth technology discussing the above problems.   8 minutes spent charting.  23 minutes total   Medication Adjustments/Labs and Tests Ordered: Current medicines are reviewed at length with the patient today.  Concerns regarding medicines are outlined above.   Patient Instructions  Medication Instructions:   No changes  *If you need a refill on your cardiac medications before your next appointment, please call your pharmacy*   Lab Work:  With PCP  If you have labs (blood work) drawn today and your tests are completely normal, you will receive your results only by: Marland Kitchen MyChart Message (if you have MyChart) OR . A paper copy in the mail If you have any lab test that is abnormal or we need to change your treatment, we will call you to review the  results.   Testing/Procedures:  We will need to check an Echocardiogram as of August 2022. Address [address]  Your physician has requested that you have an echocardiogram. Echocardiography is a painless test that uses sound waves to create images of your heart. It provides your doctor with information about the size and shape of your heart and how well your heart's chambers and valves are working. This procedure takes approximately one hour. There are no restrictions for this procedure.    Follow-Up: At [facility], you and your health needs are our priority.  As part of our continuing mission to provide you with exceptional heart care, we have created designated Provider Care Teams.  These Care Teams include your primary Cardiologist (physician) and Advanced Practice Providers (APPs -  Physician Assistants and Nurse Practitioners) who all work together to provide you with the care you need, when you need it.  We recommend signing up for the patient portal called "MyChart".  Sign  up information is provided on this After Visit Summary.  MyChart is used to connect with patients for Virtual Visits (Telemedicine).  Patients are able to view lab/test results, encounter notes, upcoming appointments, etc.  Non-urgent messages can be sent to your provider as well.   To learn more about what you can do with MyChart, go to [email or web address].    Your next appointment:   6 month(s)  The format for your next appointment:   In Person  Provider:   Glenetta Hew, MD  Other Instructions  Keep staying active.  Pay attention to the make sure that you are not having any of the concerning symptoms that we talked about.     Signed, Glenetta Hew, MD  12/27/2019 10:25 PM    [place]

## 2019-12-25 NOTE — Patient Instructions (Addendum)
Medication Instructions:   No changes  *If you need a refill on your cardiac medications before your next appointment, please call your pharmacy*   Lab Work:  With PCP  If you have labs (blood work) drawn today and your tests are completely normal, you will receive your results only by: Marland Kitchen MyChart Message (if you have MyChart) OR . A paper copy in the mail If you have any lab test that is abnormal or we need to change your treatment, we will call you to review the results.   Testing/Procedures:  We will need to check an Echocardiogram as of August 2022. Address [address]  Your physician has requested that you have an echocardiogram. Echocardiography is a painless test that uses sound waves to create images of your heart. It provides your doctor with information about the size and shape of your heart and how well your heart's chambers and valves are working. This procedure takes approximately one hour. There are no restrictions for this procedure.    Follow-Up: At [facility], you and your health needs are our priority.  As part of our continuing mission to provide you with exceptional heart care, we have created designated Provider Care Teams.  These Care Teams include your primary Cardiologist (physician) and Advanced Practice Providers (APPs -  Physician Assistants and Nurse Practitioners) who all work together to provide you with the care you need, when you need it.  We recommend signing up for the patient portal called "MyChart".  Sign up information is provided on this After Visit Summary.  MyChart is used to connect with patients for Virtual Visits (Telemedicine).  Patients are able to view lab/test results, encounter notes, upcoming appointments, etc.  Non-urgent messages can be sent to your provider as well.   To learn more about what you can do with MyChart, go to [email or web address].    Your next appointment:   6 month(s)  The format for  your next appointment:   In Person  Provider:   Bryan Lemma, MD  Other Instructions  Keep staying active.  Pay attention to the make sure that you are not having any of the concerning symptoms that we talked about.

## 2019-12-25 NOTE — Telephone Encounter (Signed)
  Patient Consent for Virtual Visit         Mary Mccarthy has provided verbal consent on 12/25/2019 for a virtual visit (video or telephone).   CONSENT FOR VIRTUAL VISIT FOR:  Mary Mccarthy  By participating in this virtual visit I agree to the following:  I hereby voluntarily request, consent and authorize [facility] and its employed or contracted physicians, physician assistants, nurse practitioners or other licensed health care professionals (the Practitioner), to provide me with telemedicine health care services (the "Services") as deemed necessary by the treating Practitioner. I acknowledge and consent to receive the Services by the Practitioner via telemedicine. I understand that the telemedicine visit will involve communicating with the Practitioner through live audiovisual communication technology and the disclosure of certain medical information by electronic transmission. I acknowledge that I have been given the opportunity to request an in-person assessment or other available alternative prior to the telemedicine visit and am voluntarily participating in the telemedicine visit.  I understand that I have the right to withhold or withdraw my consent to the use of telemedicine in the course of my care at any time, without affecting my right to future care or treatment, and that the Practitioner or I may terminate the telemedicine visit at any time. I understand that I have the right to inspect all information obtained and/or recorded in the course of the telemedicine visit and may receive copies of available information for a reasonable fee.  I understand that some of the potential risks of receiving the Services via telemedicine include:  Marland Kitchen Delay or interruption in medical evaluation due to technological equipment failure or disruption; . Information transmitted may not be sufficient (e.g. poor resolution of images) to allow for appropriate medical decision making by the Practitioner;  and/or  . In rare instances, security protocols could fail, causing a breach of personal health information.  Furthermore, I acknowledge that it is my responsibility to provide information about my medical history, conditions and care that is complete and accurate to the best of my ability. I acknowledge that Practitioner's advice, recommendations, and/or decision may be based on factors not within their control, such as incomplete or inaccurate data provided by me or distortions of diagnostic images or specimens that may result from electronic transmissions. I understand that the practice of medicine is not an exact science and that Practitioner makes no warranties or guarantees regarding treatment outcomes. I acknowledge that a copy of this consent can be made available to me via my patient portal [facility] MyChart), or I can request a printed copy by calling the office of [facility].    I understand that my insurance will be billed for this visit.   I have read or had this consent read to me. . I understand the contents of this consent, which adequately explains the benefits and risks of the Services being provided via telemedicine.  . I have been provided ample opportunity to ask questions regarding this consent and the Services and have had my questions answered to my satisfaction. . I give my informed consent for the services to be provided through the use of telemedicine in my medical care

## 2019-12-25 NOTE — Telephone Encounter (Signed)
RN spoke to patient. Instruction were given  from today's virtual visit 12/25/19 .  AVS SUMMARY has been mailed. patient is aware echo is needed in Aug 2022  no changes with medication  Recall placed for 6 month follow up .  Patient verbalized understanding

## 2019-12-27 ENCOUNTER — Encounter: Payer: Self-pay | Admitting: Cardiology

## 2019-12-27 NOTE — Assessment & Plan Note (Signed)
She tells me that her blood pressures usually are in the 135 to 145 mmHg range.  I have asked that she monitor her pressures and let us know what the average pressures are. She is already on max dose of ARB along with chlorthalidone and moderate dose carvedilol.  Would be reluctant to titrate beta-blocker further because of borderline bradycardia.  I do not really want to add a new medication.  We will continue to monitor as we are trying to avoid orthostatic hypotension.

## 2019-12-27 NOTE — Assessment & Plan Note (Addendum)
It did appear that her last echo showed slight progression of disease, however we just rechecked and the gradient appears to be pretty stable.  Somewhere between 31-36 mmHg.  Not yet to the point of being severe.  She is no longer having any chest discomfort. We can probably recheck echo in August 2022.  Discussed warning sign symptoms.

## 2019-12-27 NOTE — Assessment & Plan Note (Signed)
Lipids from August look great.  Continue to monitor.  Low threshold to consider reducing statin dose.

## 2019-12-27 NOTE — Assessment & Plan Note (Signed)
Chest pain of episode prior to last visit.  Has not had any further symptoms since.  I suspect this is probably musculoskeletal in nature.  She was evaluated with a coronary CTA that showed no evidence of obstructive disease.

## 2019-12-27 NOTE — Assessment & Plan Note (Signed)
Mildly elevated pressures by most recent echo.  I suspect this is partly related to the LV filling pressures and therefore related to aortic stenosis and diastolic dysfunction.  Continue to treat systemic hypertension.

## 2019-12-27 NOTE — Assessment & Plan Note (Signed)
Coronary calcium score was 624 which is still moderately high risk therefore we should continue to treat risk factors of hypertension hyperlipidemia as well as glycemic control.  However thankfully, coronary CTA did not show any evidence of significant obstructive disease.  This goes along with her prior cath.  Unlikely that CAD was a cause for her episode of chest pain.

## 2019-12-27 NOTE — Assessment & Plan Note (Signed)
Seems to be relatively stable.  She has a chronic edema but nothing overly worrisome to her.  She is not on loop diuretic, and is on thiazide diuretic.  Is on good dose of ARB and beta-blocker.  Class I-II symptoms.

## 2020-02-25 ENCOUNTER — Other Ambulatory Visit: Payer: Self-pay | Admitting: Cardiology

## 2020-02-26 ENCOUNTER — Other Ambulatory Visit: Payer: Self-pay | Admitting: Cardiology

## 2020-03-17 ENCOUNTER — Other Ambulatory Visit: Payer: Self-pay

## 2020-03-18 ENCOUNTER — Ambulatory Visit (INDEPENDENT_AMBULATORY_CARE_PROVIDER_SITE_OTHER): Payer: Medicare Other | Admitting: Internal Medicine

## 2020-03-18 VITALS — BP 128/72 | HR 60 | Temp 98.0°F | Ht 61.0 in | Wt 211.0 lb

## 2020-03-18 DIAGNOSIS — I10 Essential (primary) hypertension: Secondary | ICD-10-CM | POA: Diagnosis not present

## 2020-03-18 DIAGNOSIS — E119 Type 2 diabetes mellitus without complications: Secondary | ICD-10-CM

## 2020-03-18 DIAGNOSIS — E559 Vitamin D deficiency, unspecified: Secondary | ICD-10-CM | POA: Diagnosis not present

## 2020-03-18 DIAGNOSIS — Z0001 Encounter for general adult medical examination with abnormal findings: Secondary | ICD-10-CM | POA: Diagnosis not present

## 2020-03-18 DIAGNOSIS — E785 Hyperlipidemia, unspecified: Secondary | ICD-10-CM | POA: Diagnosis not present

## 2020-03-18 DIAGNOSIS — I700 Atherosclerosis of aorta: Secondary | ICD-10-CM

## 2020-03-18 LAB — HEPATIC FUNCTION PANEL
ALT: 14 U/L (ref 0–35)
AST: 14 U/L (ref 0–37)
Albumin: 3.7 g/dL (ref 3.5–5.2)
Alkaline Phosphatase: 45 U/L (ref 39–117)
Bilirubin, Direct: 0.2 mg/dL (ref 0.0–0.3)
Total Bilirubin: 1 mg/dL (ref 0.2–1.2)
Total Protein: 6.7 g/dL (ref 6.0–8.3)

## 2020-03-18 LAB — CBC WITH DIFFERENTIAL/PLATELET
Basophils Absolute: 0 10*3/uL (ref 0.0–0.1)
Basophils Relative: 0.3 % (ref 0.0–3.0)
Eosinophils Absolute: 0.2 10*3/uL (ref 0.0–0.7)
Eosinophils Relative: 2.9 % (ref 0.0–5.0)
HCT: 34.8 % — ABNORMAL LOW (ref 36.0–46.0)
Hemoglobin: 11.8 g/dL — ABNORMAL LOW (ref 12.0–15.0)
Lymphocytes Relative: 34.9 % (ref 12.0–46.0)
Lymphs Abs: 2 10*3/uL (ref 0.7–4.0)
MCHC: 33.9 g/dL (ref 30.0–36.0)
MCV: 95.1 fl (ref 78.0–100.0)
Monocytes Absolute: 0.5 10*3/uL (ref 0.1–1.0)
Monocytes Relative: 8.5 % (ref 3.0–12.0)
Neutro Abs: 3.1 10*3/uL (ref 1.4–7.7)
Neutrophils Relative %: 53.4 % (ref 43.0–77.0)
Platelets: 174 10*3/uL (ref 150.0–400.0)
RBC: 3.66 Mil/uL — ABNORMAL LOW (ref 3.87–5.11)
RDW: 13.3 % (ref 11.5–15.5)
WBC: 5.7 10*3/uL (ref 4.0–10.5)

## 2020-03-18 LAB — BASIC METABOLIC PANEL
BUN: 17 mg/dL (ref 6–23)
CO2: 28 mEq/L (ref 19–32)
Calcium: 9.8 mg/dL (ref 8.4–10.5)
Chloride: 106 mEq/L (ref 96–112)
Creatinine, Ser: 0.89 mg/dL (ref 0.40–1.20)
GFR: 60.79 mL/min (ref >60.00)
Glucose, Bld: 133 mg/dL — ABNORMAL HIGH (ref 70–99)
Potassium: 4.3 mEq/L (ref 3.5–5.1)
Sodium: 140 mEq/L (ref 135–145)

## 2020-03-18 LAB — URINALYSIS, ROUTINE W REFLEX MICROSCOPIC
Bilirubin Urine: NEGATIVE
Hgb urine dipstick: NEGATIVE
Ketones, ur: NEGATIVE
Leukocytes,Ua: NEGATIVE
Nitrite: NEGATIVE
RBC / HPF: NONE SEEN (ref 0–?)
Specific Gravity, Urine: 1.02 (ref 1.000–1.030)
Total Protein, Urine: NEGATIVE
Urine Glucose: NEGATIVE
Urobilinogen, UA: 1 (ref 0.0–1.0)
pH: 7 (ref 5.0–8.0)

## 2020-03-18 LAB — MICROALBUMIN / CREATININE URINE RATIO
Creatinine,U: 135.6 mg/dL
Microalb Creat Ratio: 1.3 mg/g (ref 0.0–30.0)
Microalb, Ur: 1.8 mg/dL (ref 0.0–1.9)

## 2020-03-18 LAB — TSH: TSH: 4.92 u[IU]/mL — ABNORMAL HIGH (ref 0.35–4.50)

## 2020-03-18 LAB — LIPID PANEL
Cholesterol: 86 mg/dL (ref 0–200)
HDL: 32.4 mg/dL — ABNORMAL LOW (ref >39.00)
LDL Cholesterol: 38 mg/dL (ref 0–99)
NonHDL: 53.27
Total CHOL/HDL Ratio: 3
Triglycerides: 78 mg/dL (ref 0.0–149.0)
VLDL: 15.6 mg/dL (ref 0.0–40.0)

## 2020-03-18 LAB — HEMOGLOBIN A1C: Hgb A1c MFr Bld: 7.2 % — ABNORMAL HIGH (ref 4.6–6.5)

## 2020-03-18 LAB — VITAMIN D 25 HYDROXY (VIT D DEFICIENCY, FRACTURES): VITD: 14.34 ng/mL — ABNORMAL LOW (ref 30.00–100.00)

## 2020-03-18 NOTE — Progress Notes (Signed)
Patient ID: Mary Mccarthy, female   DOB: 1940/05/14, 80 y.o.   MRN: [id number]         Chief Complaint:: wellness exam       HPI:  Mary Mccarthy is a 80 y.o. female here for wellness exam;  Had URI symptoms approx 4 wks ago for 2 wks and resolved,, overall mild, son was sick as well but covid neg; she wasn't tested.  Had vax x 2, but has not had the booster yet.  Plans to call for optho soon.  Has lost wt with better diet.   Wt Readings from Last 3 Encounters:  03/18/20 211 lb (95.7 kg)  09/22/19 215 lb (97.5 kg)  09/16/19 219 lb (99.3 kg)   BP Readings from Last 3 Encounters:  03/18/20 128/72  12/25/19 (!) 158/66  10/09/19 (!) 133/50   Immunization History  Administered Date(s) Administered  . Fluad Quad(high Dose 65+) 12/26/2018, 12/06/2019  . Influenza Split 12/21/2010, 12/28/2011  . Influenza Whole 12/01/2005, 03/26/2009, 12/07/2009  . Influenza, High Dose Seasonal PF 11/19/2012, 11/18/2013, 02/18/2015, 12/28/2015, 12/07/2016, 10/17/2017  . PFIZER(Purple Top)SARS-COV-2 Vaccination 05/01/2019, 05/26/2019  . Pneumococcal Conjugate-13 12/03/2012  . Pneumococcal Polysaccharide-23 11/06/2005  . Td 09/24/2008  . Tdap 09/13/2018   Health Maintenance Due  Topic Date Due  . OPHTHALMOLOGY EXAM  07/16/2019        Past Medical History:  Diagnosis Date  . ALLERGIC RHINITIS 06/24/2007  . Anemia   . AVM (arteriovenous malformation) of colon   . Blood transfusion without reported diagnosis 05/2015  . COLONIC POLYPS, HX OF 06/24/2007  . Coronary artery disease, non-occlusive 05/2015   Trop + w/ Acute Anemia =>CATH: small RI - Ostial 60%, ostial RCA 30% and dLAD 40-50%;; 9/'21: Cor Ca Score 624. Mild (25-49%) prox RCA & LAD,; Moderate (50-69%) Ostial Small RI & prox LCx.    Marland Kitchen DEPRESSION 10/08/2006  . DIABETES MELLITUS, TYPE II 10/05/2006  . GERD 06/24/2007  . HYPERLIPIDEMIA 10/08/2006  . HYPERTENSION 10/08/2006  . INSOMNIA 09/24/2008  . Left knee DJD   . Moderate to severe aortic stenosis  05/2015   a) Mod AS (mean gras 22 mmHg, peak 42 mmHg -AVA 1.1-1.2 cm.);b) 04/2017: Severe Ca2+ AoV w/ Mod-Severe AS (p-m grad 60-34 mmHg -Moderate), calculated AVA 0.8 cm -Severe;;c) 03/2019: Mod AS (m grad 31 mmHg); d) 09/2019: Mod AS (m grad 34.5 mmHg).  . Myocardial infarction ([place]) 05/2015   Type 2 MI - related to GI Bleed / Anemia (non-obstructive CAD by Cath)  . Osteoporosis 03/10/2016  . PEPTIC ULCER DISEASE 10/08/2006   Past Surgical History:  Procedure Laterality Date  . APPENDECTOMY    . BREAST BIOPSY    . CARDIAC CATHETERIZATION N/A 05/21/2015   Procedure: Left Heart Cath and Coronary Angiography;  Surgeon: Leonie Man, MD;  Location: [facility];  Service: Cardiovascular;: Ost RI 60%, Ost RCA 30%, dLAD tapers to small vessel w/ 40-50%. Mildly elevated LVEDP. Normal LV Fxn.  . COLONOSCOPY N/A 05/20/2015   Procedure: COLONOSCOPY;  Surgeon: Manus Gunning, MD;  Location: [place];  Service: Gastroenterology;  Laterality: N/A;  . CORONARY CA2+ SCORE / CARDIAC CT ANGIOGRAM  10/09/2019   Calcium score 624.  82nd percentile. Dominant RCA: Mild (25-49%) proximal stenosis-distal bifurcation into PDA and PAV--< RPL branches.  LAD (1 major mid vessel diagonal) diffuse calcified plaque, mild proximal stenosis with minimal distal stenosis.  Small RI moderate ostial disease.  LCx-moderate mixed (50-69%) proximal stenosis.  Small dOM1 disease.  Trileaflet AoV,  annular Ca2+ - probable AS  . ESOPHAGOGASTRODUODENOSCOPY N/A 05/20/2015   Procedure: ESOPHAGOGASTRODUODENOSCOPY (EGD);  Surgeon: Manus Gunning, MD;  Location: [place];  Service: Gastroenterology;  Laterality: N/A;  . TONSILLECTOMY    . TRANSTHORACIC ECHOCARDIOGRAM  03/2019; 09/2019   a) EF 60 to 65%.  Moderate LVH.  GRII DD.  Mod-Severe AS (m grad 36 mmHg, peak 59 mmHg); b) EF 65-70%, No RWMA. Gr1 DD/hi LAP, Mild hi PAP. Mod LA Dil. MOD AS (mean Grad 34.5 mmHg).  STABLE   . TRANSTHORACIC ECHOCARDIOGRAM  04/12/2017    EF 60-65%. No RWMA.  GR 1 DD.  Moderate concentric LVH.  Mild LA dilation. Severe calcified aortic valve with moderate-severe aortic stenosis (peak/mean gradients 60/34 mmHg) and in severe range by AVA (0.8 cm2).  Mild to moderately increased PA pressures 41 mmHg with normal RV size and function..    . TUBAL LIGATION      reports that she has never smoked. She has never used smokeless tobacco. She reports that she does not drink alcohol and does not use drugs. family history includes Alcohol abuse in an other family member; Arrhythmia in her sister; Arthritis in an other family member; CAD in her sister; Cancer in her mother; Depression in an other family member; Diabetes in an other family member; Heart attack (age of onset: 93) in her sister; Heart disease in an other family member; Hyperlipidemia in an other family member; Hypertension in an other family member; Stroke in an other family member. Allergies  Allergen Reactions  . Ibuprofen Other (See Comments)    Bleeding events   Current Outpatient Medications on File Prior to Visit  Medication Sig Dispense Refill  . acetaminophen (TYLENOL) 500 MG tablet Take 500 mg by mouth every 6 (six) hours as needed for moderate pain.    Marland Kitchen aspirin EC 81 MG tablet Take 81 mg by mouth. Take 81 mg tablet  On Monday , Wednesday , Friday and Saturday    . blood glucose meter kit and supplies Dispense based on patient and insurance preference. Use up to four times daily as directed. E11.9 1 each 0  . Blood Glucose Monitoring Suppl (ONE TOUCH ULTRA 2) w/Device KIT Use as directed daily 1 each 0  . carvedilol (COREG) 6.25 MG tablet TAKE 1 TABLET BY MOUTH TWICE A DAY 180 tablet 2  . chlorthalidone (HYGROTON) 25 MG tablet TAKE 1 TABLET BY MOUTH EVERY DAY 90 tablet 1  . [employer] VITAMIN B12 1000 MCG tablet TAKE 1 TABLET BY MOUTH EVERY DAY 90 tablet 1  . glimepiride (AMARYL) 2 MG tablet TAKE 1 TABLET BY MOUTH DAILY BEFORE BREAKFAST. 90 tablet 1  . Lancets (ONETOUCH  DELICA PLUS [id number]) MISC USE AS DIRECTED TEST 1-2 TIMES A DAY 100 each 23  . metFORMIN (GLUCOPHAGE) 1000 MG tablet TAKE 1 TAB BY MOUTH IN THE AM,AND 1/2 TAB IN THE PM. 135 tablet 3  . ONETOUCH ULTRA test strip USE AS INSTRUCTED TEST 1-2 TIMES A DAY 100 strip 23  . pantoprazole (PROTONIX) 40 MG tablet TAKE 1 TABLET BY MOUTH EVERY DAY 90 tablet 3  . telmisartan (MICARDIS) 80 MG tablet TAKE 1 TABLET BY MOUTH EVERY DAY 90 tablet 3  . atorvastatin (LIPITOR) 40 MG tablet Take 1 tablet (40 mg total) by mouth daily. 90 tablet 3   No current facility-administered medications on file prior to visit.        ROS:  All others reviewed and negative.  Objective  PE:  BP 128/72   Pulse 60   Temp 98 F (36.7 C) (Oral)   Ht 5\' 1"  (1.549 m)   Wt 211 lb (95.7 kg)   SpO2 97%   BMI 39.87 kg/m                 Constitutional: Pt appears in NAD               HENT: Head: NCAT.                Right Ear: External ear normal.                 Left Ear: External ear normal.                Eyes: . Pupils are equal, round, and reactive to light. Conjunctivae and EOM are normal               Nose: without d/c or deformity               Neck: Neck supple. Gross normal ROM               Cardiovascular: Normal rate and regular rhythm.                 Pulmonary/Chest: Effort normal and breath sounds without rales or wheezing.                Abd:  Soft, NT, ND, + BS, no organomegaly               Neurological: Pt is alert. At baseline orientation, motor grossly intact               Skin: Skin is warm. No rashes, no other new lesions, LE edema - none               Psychiatric: Pt behavior is normal without agitation   Micro: none  Cardiac tracings I have personally interpreted today:  none  Pertinent Radiological findings (summarize): none   Lab Results  Component Value Date   WBC 5.7 03/18/2020   HGB 11.8 (L) 03/18/2020   HCT 34.8 (L) 03/18/2020   PLT 174.0 03/18/2020   GLUCOSE 133 (H) 03/18/2020    CHOL 86 03/18/2020   TRIG 78.0 03/18/2020   HDL 32.40 (L) 03/18/2020   LDLCALC 38 03/18/2020   ALT 14 03/18/2020   AST 14 03/18/2020   NA 140 03/18/2020   K 4.3 03/18/2020   CL 106 03/18/2020   CREATININE 0.89 03/18/2020   BUN 17 03/18/2020   CO2 28 03/18/2020   TSH 4.92 (H) 03/18/2020   INR 1.18 05/21/2015   HGBA1C 7.2 (H) 03/18/2020   MICROALBUR 1.8 03/18/2020   Assessment/Plan:  Anabia Weatherwax is a 80 y.o. White or Caucasian [1] female with  has a past medical history of ALLERGIC RHINITIS (06/24/2007), Anemia, AVM (arteriovenous malformation) of colon, Blood transfusion without reported diagnosis (05/2015), COLONIC POLYPS, HX OF (06/24/2007), Coronary artery disease, non-occlusive (05/2015), DEPRESSION (10/08/2006), DIABETES MELLITUS, TYPE II (10/05/2006), GERD (06/24/2007), HYPERLIPIDEMIA (10/08/2006), HYPERTENSION (10/08/2006), INSOMNIA (09/24/2008), Left knee DJD, Moderate to severe aortic stenosis (05/2015), Myocardial infarction ([facility]) (05/2015), Osteoporosis (03/10/2016), and PEPTIC ULCER DISEASE (10/08/2006). Encounter for well adult exam with abnormal findings Age and sex appropriate education and counseling updated with regular exercise and diet Referrals for preventative services - none needed except pt to call for optho exam Immunizations addressed - none needed Smoking counseling  - none needed Evidence  for depression or other mood disorder - none significant Most recent labs reviewed. I have personally reviewed and have noted: 1) the patient's medical and social history 2) The patient's current medications and supplements 3) The patient's height, weight, and BMI have been recorded in the chart   Aortic atherosclerosis [facility]) Lab Results  Component Value Date   LDLCALC 38 03/18/2020   Stable, pt to continue current statin lipitor 40  Current Outpatient Medications (Endocrine & Metabolic):  .  glimepiride (AMARYL) 2 MG tablet, TAKE 1 TABLET BY MOUTH DAILY BEFORE BREAKFAST. .   metFORMIN (GLUCOPHAGE) 1000 MG tablet, TAKE 1 TAB BY MOUTH IN THE AM,AND 1/2 TAB IN THE PM.  Current Outpatient Medications (Cardiovascular):  .  carvedilol (COREG) 6.25 MG tablet, TAKE 1 TABLET BY MOUTH TWICE A DAY .  chlorthalidone (HYGROTON) 25 MG tablet, TAKE 1 TABLET BY MOUTH EVERY DAY .  telmisartan (MICARDIS) 80 MG tablet, TAKE 1 TABLET BY MOUTH EVERY DAY .  atorvastatin (LIPITOR) 40 MG tablet, Take 1 tablet (40 mg total) by mouth daily.   Current Outpatient Medications (Analgesics):  .  acetaminophen (TYLENOL) 500 MG tablet, Take 500 mg by mouth every 6 (six) hours as needed for moderate pain. Marland Kitchen  aspirin EC 81 MG tablet, Take 81 mg by mouth. Take 81 mg tablet  On Monday , Wednesday , Friday and Saturday  Current Outpatient Medications (Hematological):  Marland Kitchen  [employer] VITAMIN B12 1000 MCG tablet, TAKE 1 TABLET BY MOUTH EVERY DAY  Current Outpatient Medications (Other):  .  blood glucose meter kit and supplies, Dispense based on patient and insurance preference. Use up to four times daily as directed. E11.9 .  Blood Glucose Monitoring Suppl (ONE TOUCH ULTRA 2) w/Device KIT, Use as directed daily .  Lancets (ONETOUCH DELICA PLUS [id number]) MISC, USE AS DIRECTED TEST 1-2 TIMES A DAY .  ONETOUCH ULTRA test strip, USE AS INSTRUCTED TEST 1-2 TIMES A DAY .  pantoprazole (PROTONIX) 40 MG tablet, TAKE 1 TABLET BY MOUTH EVERY DAY .  clonazePAM (KLONOPIN) 0.5 MG tablet, TAKE 1 TABLET BY MOUTH TWICE A DAY AS NEEDED FOR ANXIETY   Vitamin D deficiency Last vitamin D Lab Results  Component Value Date   VD25OH 14.34 (L) 03/18/2020   Low, to start vit d3 oral replacement   Essential hypertension BP Readings from Last 3 Encounters:  03/18/20 128/72  12/25/19 (!) 158/66  10/09/19 (!) 133/50   Stable, pt to continue medical treatment coreg, chlorthalidone, micardis   Diabetes Lab Results  Component Value Date   HGBA1C 7.2 (H) 03/18/2020   Stable, pt to continue current medical treatment  amaryl, metformin   Hyperlipidemia with target LDL less than 70 Lab Results  Component Value Date   LDLCALC 38 03/18/2020   Stable, pt to continue current statin lipitor    Followup: Return in about 6 months (around 09/15/2020).  Cathlean Cower, MD 03/22/2020 10:20 PM [facility]

## 2020-03-18 NOTE — Patient Instructions (Signed)

## 2020-03-20 ENCOUNTER — Encounter: Payer: Self-pay | Admitting: Internal Medicine

## 2020-03-21 ENCOUNTER — Other Ambulatory Visit: Payer: Self-pay | Admitting: Internal Medicine

## 2020-03-22 ENCOUNTER — Encounter: Payer: Self-pay | Admitting: Internal Medicine

## 2020-03-22 NOTE — Assessment & Plan Note (Signed)
BP Readings from Last 3 Encounters:  03/18/20 128/72  12/25/19 (!) 158/66  10/09/19 (!) 133/50   Stable, pt to continue medical treatment coreg, chlorthalidone, micardis

## 2020-03-22 NOTE — Assessment & Plan Note (Signed)
Lab Results  Component Value Date   LDLCALC 38 03/18/2020   Stable, pt to continue current statin lipitor

## 2020-03-22 NOTE — Assessment & Plan Note (Addendum)
Lab Results  Component Value Date   HGBA1C 7.2 (H) 03/18/2020   Stable, pt to continue current medical treatment amaryl, metformin

## 2020-03-22 NOTE — Assessment & Plan Note (Signed)
Last vitamin D Lab Results  Component Value Date   VD25OH 14.34 (L) 03/18/2020   Low, to start vit d3 oral replacement

## 2020-03-22 NOTE — Assessment & Plan Note (Signed)
Lab Results  Component Value Date   LDLCALC 38 03/18/2020   Stable, pt to continue current statin lipitor 40  Current Outpatient Medications (Endocrine & Metabolic):  .  glimepiride (AMARYL) 2 MG tablet, TAKE 1 TABLET BY MOUTH DAILY BEFORE BREAKFAST. .  metFORMIN (GLUCOPHAGE) 1000 MG tablet, TAKE 1 TAB BY MOUTH IN THE AM,AND 1/2 TAB IN THE PM.  Current Outpatient Medications (Cardiovascular):  .  carvedilol (COREG) 6.25 MG tablet, TAKE 1 TABLET BY MOUTH TWICE A DAY .  chlorthalidone (HYGROTON) 25 MG tablet, TAKE 1 TABLET BY MOUTH EVERY DAY .  telmisartan (MICARDIS) 80 MG tablet, TAKE 1 TABLET BY MOUTH EVERY DAY .  atorvastatin (LIPITOR) 40 MG tablet, Take 1 tablet (40 mg total) by mouth daily.   Current Outpatient Medications (Analgesics):  .  acetaminophen (TYLENOL) 500 MG tablet, Take 500 mg by mouth every 6 (six) hours as needed for moderate pain. Marland Kitchen  aspirin EC 81 MG tablet, Take 81 mg by mouth. Take 81 mg tablet  On Monday , Wednesday , Friday and Saturday  Current Outpatient Medications (Hematological):  Marland Kitchen  [employer]S VITAMIN B12 1000 MCG tablet, TAKE 1 TABLET BY MOUTH EVERY DAY  Current Outpatient Medications (Other):  .  blood glucose meter kit and supplies, Dispense based on patient and insurance preference. Use up to four times daily as directed. E11.9 .  Blood Glucose Monitoring Suppl (ONE TOUCH ULTRA 2) w/Device KIT, Use as directed daily .  Lancets (ONETOUCH DELICA PLUS [id number]) MISC, USE AS DIRECTED TEST 1-2 TIMES A DAY .  ONETOUCH ULTRA test strip, USE AS INSTRUCTED TEST 1-2 TIMES A DAY .  pantoprazole (PROTONIX) 40 MG tablet, TAKE 1 TABLET BY MOUTH EVERY DAY .  clonazePAM (KLONOPIN) 0.5 MG tablet, TAKE 1 TABLET BY MOUTH TWICE A DAY AS NEEDED FOR ANXIETY

## 2020-03-22 NOTE — Assessment & Plan Note (Signed)
Age and sex appropriate education and counseling updated with regular exercise and diet Referrals for preventative services - none needed except pt to call for optho exam Immunizations addressed - none needed Smoking counseling  - none needed Evidence for depression or other mood disorder - none significant Most recent labs reviewed. I have personally reviewed and have noted: 1) the patient's medical and social history 2) The patient's current medications and supplements 3) The patient's height, weight, and BMI have been recorded in the chart

## 2020-03-24 ENCOUNTER — Other Ambulatory Visit (HOSPITAL_COMMUNITY): Payer: Medicare Other

## 2020-04-08 ENCOUNTER — Other Ambulatory Visit: Payer: Self-pay | Admitting: Internal Medicine

## 2020-04-13 ENCOUNTER — Other Ambulatory Visit: Payer: Self-pay | Admitting: Internal Medicine

## 2020-04-13 NOTE — Telephone Encounter (Signed)
Please refill as per office routine med refill policy (all routine meds refilled for 3 mo or monthly per pt preference up to one year from last visit, then month to month grace period for 3 mo, then further med refills will have to be denied)  

## 2020-04-16 ENCOUNTER — Encounter: Payer: Self-pay | Admitting: Gastroenterology

## 2020-05-03 ENCOUNTER — Telehealth: Payer: Self-pay | Admitting: Cardiology

## 2020-05-03 NOTE — Telephone Encounter (Signed)
Pt c/o of Chest Pain: STAT if CP now or developed within 24 hours  1. Are you having CP right now? no  2. Are you experiencing any other symptoms (ex. SOB, nausea, vomiting, sweating)? no  3. How long have you been experiencing CP? Started this morning around 3:30am.  4. Is your CP continuous or coming and going? A continuous event that only happened about 2 mins.   5. Have you taken Nitroglycerin? No   She states her chest felt like it was going to explode and it woke her up. She has false teeth but states that it felt like her teeth were going into her gums and shooting a pain to her head. She thought she was having a heart attack. Denies any symptoms right now. ?

## 2020-05-03 NOTE — Telephone Encounter (Signed)
This does sound a panic attack.  Would recommend she discuss with Dr. Jonny Ruiz.  Bryan Lemma, MD

## 2020-05-03 NOTE — Telephone Encounter (Signed)
Pt advised and reports that she was feeling much better and will monitor her symptoms if they return  but for now she is doing much better.
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
## 2020-06-07 ENCOUNTER — Other Ambulatory Visit: Payer: Self-pay | Admitting: Internal Medicine

## 2020-06-24 ENCOUNTER — Ambulatory Visit: Payer: Medicare Other | Admitting: Cardiology

## 2020-07-07 NOTE — Progress Notes (Deleted)
Cardiology Clinic Note   Patient Name: Mary Mccarthy Date of Encounter: 07/07/2020  Primary Care Provider:  Biagio Borg, MD Primary Cardiologist:  Glenetta Hew, MD  Patient Profile    ***  Past Medical History    Past Medical History:  Diagnosis Date  . ALLERGIC RHINITIS 06/24/2007  . Anemia   . AVM (arteriovenous malformation) of colon   . Blood transfusion without reported diagnosis 05/2015  . COLONIC POLYPS, HX OF 06/24/2007  . Coronary artery disease, non-occlusive 05/2015   Trop + w/ Acute Anemia =>CATH: small RI - Ostial 60%, ostial RCA 30% and dLAD 40-50%;; 9/'21: Cor Ca Score 624. Mild (25-49%) prox RCA & LAD,; Moderate (50-69%) Ostial Small RI & prox LCx.    Marland Kitchen DEPRESSION 10/08/2006  . DIABETES MELLITUS, TYPE II 10/05/2006  . GERD 06/24/2007  . HYPERLIPIDEMIA 10/08/2006  . HYPERTENSION 10/08/2006  . INSOMNIA 09/24/2008  . Left knee DJD   . Moderate to severe aortic stenosis 05/2015   a) Mod AS (mean gras 22 mmHg, peak 42 mmHg -AVA 1.1-1.2 cm.);b) 04/2017: Severe Ca2+ AoV w/ Mod-Severe AS (p-m grad 60-34 mmHg -Moderate), calculated AVA 0.8 cm -Severe;;c) 03/2019: Mod AS (m grad 31 mmHg); d) 09/2019: Mod AS (m grad 34.5 mmHg).  . Myocardial infarction ([place]) 05/2015   Type 2 MI - related to GI Bleed / Anemia (non-obstructive CAD by Cath)  . Osteoporosis 03/10/2016  . PEPTIC ULCER DISEASE 10/08/2006   Past Surgical History:  Procedure Laterality Date  . APPENDECTOMY    . BREAST BIOPSY    . CARDIAC CATHETERIZATION N/A 05/21/2015   Procedure: Left Heart Cath and Coronary Angiography;  Surgeon: Leonie Man, MD;  Location: [facility];  Service: Cardiovascular;: Ost RI 60%, Ost RCA 30%, dLAD tapers to small vessel w/ 40-50%. Mildly elevated LVEDP. Normal LV Fxn.  . COLONOSCOPY N/A 05/20/2015   Procedure: COLONOSCOPY;  Surgeon: Manus Gunning, MD;  Location: [facility];  Service: Gastroenterology;  Laterality: N/A;  . CORONARY CA2+ SCORE / CARDIAC CT ANGIOGRAM   10/09/2019   Calcium score 624.  82nd percentile. Dominant RCA: Mild (25-49%) proximal stenosis-distal bifurcation into PDA and PAV--< RPL branches.  LAD (1 major mid vessel diagonal) diffuse calcified plaque, mild proximal stenosis with minimal distal stenosis.  Small RI moderate ostial disease.  LCx-moderate mixed (50-69%) proximal stenosis.  Small dOM1 disease.  Trileaflet AoV, annular Ca2+ - probable AS  . ESOPHAGOGASTRODUODENOSCOPY N/A 05/20/2015   Procedure: ESOPHAGOGASTRODUODENOSCOPY (EGD);  Surgeon: Manus Gunning, MD;  Location: [facility];  Service: Gastroenterology;  Laterality: N/A;  . TONSILLECTOMY    . TRANSTHORACIC ECHOCARDIOGRAM  03/2019; 09/2019   a) EF 60 to 65%.  Moderate LVH.  GRII DD.  Mod-Severe AS (m grad 36 mmHg, peak 59 mmHg); b) EF 65-70%, No RWMA. Gr1 DD/hi LAP, Mild hi PAP. Mod LA Dil. MOD AS (mean Grad 34.5 mmHg).  STABLE   . TRANSTHORACIC ECHOCARDIOGRAM  04/12/2017   EF 60-65%. No RWMA.  GR 1 DD.  Moderate concentric LVH.  Mild LA dilation. Severe calcified aortic valve with moderate-severe aortic stenosis (peak/mean gradients 60/34 mmHg) and in severe range by AVA (0.8 cm2).  Mild to moderately increased PA pressures 41 mmHg with normal RV size and function..    . TUBAL LIGATION      Allergies  Allergies  Allergen Reactions  . Ibuprofen Other (See Comments)    Bleeding events    History of Present Illness    ***  Home  Medications    Prior to Admission medications   Medication Sig Start Date End Date Taking? Authorizing Provider  acetaminophen (TYLENOL) 500 MG tablet Take 500 mg by mouth every 6 (six) hours as needed for moderate pain.    [provider]  aspirin EC 81 MG tablet Take 81 mg by mouth. Take 81 mg tablet  On Monday , Wednesday , Friday and Saturday    [provider]  atorvastatin (LIPITOR) 40 MG tablet Take 1 tablet (40 mg total) by mouth daily. 08/21/19 12/25/19  Leonie Man, MD  blood glucose meter kit and  supplies Dispense based on patient and insurance preference. Use up to four times daily as directed. E11.9 04/15/18   Biagio Borg, MD  Blood Glucose Monitoring Suppl (ONE TOUCH ULTRA 2) w/Device KIT Use as directed daily 06/01/15   Biagio Borg, MD  carvedilol (COREG) 6.25 MG tablet TAKE 1 TABLET BY MOUTH TWICE A DAY 02/26/20   Leonie Man, MD  chlorthalidone (HYGROTON) 25 MG tablet TAKE 1 TABLET BY MOUTH EVERY DAY 02/26/20   Leonie Man, MD  clonazePAM (KLONOPIN) 0.5 MG tablet TAKE 1 TABLET BY MOUTH TWICE A DAY AS NEEDED FOR ANXIETY 06/07/20   Biagio Borg, MD  [facility] VITAMIN B12 1000 MCG tablet TAKE 1 TABLET BY MOUTH EVERY DAY 04/08/20   Biagio Borg, MD  glimepiride (AMARYL) 2 MG tablet TAKE 1 TABLET BY MOUTH DAILY BEFORE BREAKFAST. 04/13/20   Biagio Borg, MD  Lancets [facility] DELICA PLUS [id number]) [place] USE AS DIRECTED TEST 1-2 TIMES A DAY 07/28/19   Biagio Borg, MD  metFORMIN (GLUCOPHAGE) 1000 MG tablet TAKE 1 TAB BY MOUTH IN THE AM,AND 1/2 TAB IN THE PM. 10/28/19   Biagio Borg, MD  [facility] ULTRA test strip USE AS INSTRUCTED TEST 1-2 TIMES A DAY 07/28/19   Biagio Borg, MD  pantoprazole (PROTONIX) 40 MG tablet TAKE 1 TABLET BY MOUTH EVERY DAY 10/28/19   Biagio Borg, MD  telmisartan (MICARDIS) 80 MG tablet TAKE 1 TABLET BY MOUTH EVERY DAY 02/25/20   Leonie Man, MD    Family History    Family History  Problem Relation Age of Onset  . Alcohol abuse Other   . Arthritis Other        DJD  . Hyperlipidemia Other   . Heart disease Other   . Stroke Other   . Hypertension Other   . Depression Other   . Diabetes Other   . Cancer Mother        ENT cancer  . CAD Sister        Several MI & PPM; long term smoker, EtOH  . Heart attack Sister 7       several  . Arrhythmia Sister        s/p PPM (Dr. Sallyanne Kuster)   She indicated that the status of her mother is unknown. She indicated that the status of her sister is unknown. She indicated that her other is alive.  Social History     Social History   Socioeconomic History  . Marital status: Widowed    Spouse name: Not on file  . Number of children: 1  . Years of education: Not on file  . Highest education level: Not on file  Occupational History  . Occupation: retired - Social research officer, [employer]: RETIRED  Tobacco Use  . Smoking status: Never Smoker  . Smokeless tobacco: Never Used  Substance and Sexual Activity  . Alcohol use: No    Alcohol/week: 0.0 standard drinks  . Drug use: No  . Sexual activity: Not on file  Other Topics Concern  . Not on file  Social History Narrative   Recently widowed--husband died in 16-Nov-2018.      Now lives alone but her son usually stays with her at night.  During the day she has 2 nephews who live nearby to come in and check on her intermittently.  Also one of her nieces calls routinely.      When her son is home and awake, she may try to walk on a treadmill, but is scared to walk outside.   Social Determinants of Health   Financial Resource Strain: Not on file  Food Insecurity: Not on file  Transportation Needs: Not on file  Physical Activity: Not on file  Stress: Not on file  Social Connections: Not on file  Intimate Partner Violence: Not on file     Review of Systems    General:  No chills, fever, night sweats or weight changes.  Cardiovascular:  No chest pain, dyspnea on exertion, edema, orthopnea, palpitations, paroxysmal nocturnal dyspnea. Dermatological: No rash, lesions/masses Respiratory: No cough, dyspnea Urologic: No hematuria, dysuria Abdominal:   No nausea, vomiting, diarrhea, bright red blood per rectum, melena, or hematemesis Neurologic:  No visual changes, wkns, changes in mental status. All other systems reviewed and are otherwise negative except as noted above.  Physical Exam    VS:  There were no vitals taken for this visit. , BMI There is no height or weight on file to calculate BMI. GEN: Well nourished, well developed, in no  acute distress. HEENT: normal. Neck: Supple, no JVD, carotid bruits, or masses. Cardiac: RRR, no murmurs, rubs, or gallops. No clubbing, cyanosis, edema.  Radials/DP/PT 2+ and equal bilaterally.  Respiratory:  Respirations regular and unlabored, clear to auscultation bilaterally. GI: Soft, nontender, nondistended, BS + x 4. MS: no deformity or atrophy. Skin: warm and dry, no rash. Neuro:  Strength and sensation are intact. Psych: Normal affect.  Accessory Clinical Findings    Recent Labs: 03/18/2020: ALT 14; BUN 17; Creatinine, Ser 0.89; Hemoglobin 11.8; Platelets 174.0; Potassium 4.3; Sodium 140; TSH 4.92   Recent Lipid Panel    Component Value Date/Time   CHOL 86 03/18/2020 1125   TRIG 78.0 03/18/2020 1125   TRIG 90 12/01/2005 1330   HDL 32.40 (L) 03/18/2020 1125   CHOLHDL 3 03/18/2020 1125   VLDL 15.6 03/18/2020 1125   LDLCALC 38 03/18/2020 1125    ECG personally reviewed by me today- *** - No acute changes  Assessment & Plan   1.  ***   Jossie Ng. Brinsley Wence NP-C    07/07/2020, 1:31 PM [facility] [place] [address] Office [phone] Fax [phone]  Notice: This dictation was prepared with Dragon dictation along with smaller phrase technology. Any transcriptional errors that result from this process are unintentional and may not be corrected upon review.  I spent***minutes examining this patient, reviewing medications, and using patient centered shared decision making involving her cardiac care.  Prior to her visit I spent greater than 20 minutes reviewing her past medical history,  medications, and prior cardiac tests.

## 2020-07-09 ENCOUNTER — Ambulatory Visit: Payer: Medicare Other | Admitting: General Practice

## 2020-07-14 ENCOUNTER — Other Ambulatory Visit: Payer: Self-pay | Admitting: Internal Medicine

## 2020-07-14 MED ORDER — CLONAZEPAM 0.5 MG PO TABS: ORAL_TABLET | ORAL | 1 refills | Status: DC

## 2020-07-14 MED ORDER — PANTOPRAZOLE SODIUM 40 MG PO TBEC: 1.0000 | DELAYED_RELEASE_TABLET | Freq: Every day | ORAL | 0 refills | Status: DC

## 2020-07-14 MED ORDER — CYANOCOBALAMIN 1000 MCG PO TABS: 1000.0000 ug | ORAL_TABLET | Freq: Every day | ORAL | 0 refills | Status: DC

## 2020-07-14 MED ORDER — METFORMIN HCL 1000 MG PO TABS: ORAL_TABLET | ORAL | 0 refills | Status: DC

## 2020-07-14 NOTE — Telephone Encounter (Signed)
1.Medication Requested: [facility] VITAMIN B12 1000 MCG tablet  clonazePAM (KLONOPIN) 0.5 MG tablet  pantoprazole (PROTONIX) 40 MG tablet  metFORMIN (GLUCOPHAGE) 1000 MG tablet    2. Pharmacy (Name, Street, [place]): [phone] [place] [place] - [address]  3. On Med List: yes   4. Last Visit with PCP: 03-18-20  5. Next visit date with PCP: 09-16-20   Agent: Please be advised that RX refills may take up to 3 business days. We ask that you follow-up with your pharmacy.

## 2020-07-14 NOTE — Telephone Encounter (Signed)
Clonazepam last filled 06/19/20 per PMP; please advise

## 2020-08-06 ENCOUNTER — Other Ambulatory Visit: Payer: Self-pay | Admitting: Cardiology

## 2020-08-06 DIAGNOSIS — I350 Nonrheumatic aortic (valve) stenosis: Secondary | ICD-10-CM

## 2020-08-06 HISTORY — DX: Nonrheumatic aortic (valve) stenosis: I35.0

## 2020-08-09 ENCOUNTER — Telehealth: Payer: Self-pay | Admitting: Student in an Organized Health Care Education/Training Program

## 2020-08-09 NOTE — Telephone Encounter (Signed)
I was alerted by page at 11:36 PM that the patient's nephew Mr. Mary Mccarthy had called regarding Ms. Mary Mccarthy having slurred speech and confusion.  After several attempts I was able to reach Mr. Mary Mccarthy via phone.  He stated that she developed acute onset slurred speech and confusion at 10:30 p.m.  No other associated symptoms including chest pain, shortness of breath, or loss of consciousness.  By the time that we started our conversation he had already called EMS who had arrived.  I instructed him to make sure that they took Ms. Mary Mccarthy to the emergency department given the concern for possible acute CVA.  Mr. Mary Mccarthy verbalized an understanding.

## 2020-08-10 ENCOUNTER — Observation Stay (HOSPITAL_BASED_OUTPATIENT_CLINIC_OR_DEPARTMENT_OTHER): Payer: Medicare Other

## 2020-08-10 ENCOUNTER — Other Ambulatory Visit: Payer: Self-pay

## 2020-08-10 ENCOUNTER — Observation Stay (HOSPITAL_COMMUNITY): Payer: Medicare Other

## 2020-08-10 ENCOUNTER — Emergency Department (HOSPITAL_COMMUNITY): Payer: Medicare Other

## 2020-08-10 ENCOUNTER — Encounter (HOSPITAL_COMMUNITY): Payer: Self-pay

## 2020-08-10 ENCOUNTER — Inpatient Hospital Stay (HOSPITAL_COMMUNITY)
Admission: EM | Admit: 2020-08-10 | Discharge: 2020-08-12 | DRG: 065 | Disposition: A | Discharge status: Home Health | Payer: Medicare Other | Attending: Internal Medicine | Admitting: Internal Medicine

## 2020-08-10 DIAGNOSIS — Z823 Family history of stroke: Secondary | ICD-10-CM

## 2020-08-10 DIAGNOSIS — R739 Hyperglycemia, unspecified: Secondary | ICD-10-CM | POA: Diagnosis not present

## 2020-08-10 DIAGNOSIS — G459 Transient cerebral ischemic attack, unspecified: Secondary | ICD-10-CM

## 2020-08-10 DIAGNOSIS — I480 Paroxysmal atrial fibrillation: Secondary | ICD-10-CM | POA: Diagnosis present

## 2020-08-10 DIAGNOSIS — I4891 Unspecified atrial fibrillation: Principal | ICD-10-CM | POA: Diagnosis present

## 2020-08-10 DIAGNOSIS — Z8711 Personal history of peptic ulcer disease: Secondary | ICD-10-CM

## 2020-08-10 DIAGNOSIS — E669 Obesity, unspecified: Secondary | ICD-10-CM | POA: Diagnosis present

## 2020-08-10 DIAGNOSIS — G8314 Monoplegia of lower limb affecting left nondominant side: Secondary | ICD-10-CM | POA: Diagnosis not present

## 2020-08-10 DIAGNOSIS — E1122 Type 2 diabetes mellitus with diabetic chronic kidney disease: Secondary | ICD-10-CM

## 2020-08-10 DIAGNOSIS — Z8249 Family history of ischemic heart disease and other diseases of the circulatory system: Secondary | ICD-10-CM

## 2020-08-10 DIAGNOSIS — I252 Old myocardial infarction: Secondary | ICD-10-CM

## 2020-08-10 DIAGNOSIS — I63443 Cerebral infarction due to embolism of bilateral cerebellar arteries: Secondary | ICD-10-CM | POA: Diagnosis not present

## 2020-08-10 DIAGNOSIS — M1712 Unilateral primary osteoarthritis, left knee: Secondary | ICD-10-CM | POA: Diagnosis not present

## 2020-08-10 DIAGNOSIS — Z79899 Other long term (current) drug therapy: Secondary | ICD-10-CM | POA: Diagnosis not present

## 2020-08-10 DIAGNOSIS — Z818 Family history of other mental and behavioral disorders: Secondary | ICD-10-CM

## 2020-08-10 DIAGNOSIS — E785 Hyperlipidemia, unspecified: Secondary | ICD-10-CM | POA: Diagnosis not present

## 2020-08-10 DIAGNOSIS — Z20822 Contact with and (suspected) exposure to covid-19: Secondary | ICD-10-CM | POA: Diagnosis not present

## 2020-08-10 DIAGNOSIS — R4781 Slurred speech: Secondary | ICD-10-CM

## 2020-08-10 DIAGNOSIS — Z86010 Personal history of colonic polyps: Secondary | ICD-10-CM

## 2020-08-10 DIAGNOSIS — I358 Other nonrheumatic aortic valve disorders: Secondary | ICD-10-CM | POA: Diagnosis not present

## 2020-08-10 DIAGNOSIS — Z7984 Long term (current) use of oral hypoglycemic drugs: Secondary | ICD-10-CM

## 2020-08-10 DIAGNOSIS — R4701 Aphasia: Secondary | ICD-10-CM | POA: Diagnosis not present

## 2020-08-10 DIAGNOSIS — R6889 Other general symptoms and signs: Secondary | ICD-10-CM | POA: Diagnosis not present

## 2020-08-10 DIAGNOSIS — I639 Cerebral infarction, unspecified: Secondary | ICD-10-CM | POA: Diagnosis not present

## 2020-08-10 DIAGNOSIS — I5032 Chronic diastolic (congestive) heart failure: Secondary | ICD-10-CM | POA: Diagnosis present

## 2020-08-10 DIAGNOSIS — E1169 Type 2 diabetes mellitus with other specified complication: Secondary | ICD-10-CM | POA: Diagnosis present

## 2020-08-10 DIAGNOSIS — I499 Cardiac arrhythmia, unspecified: Secondary | ICD-10-CM | POA: Diagnosis not present

## 2020-08-10 DIAGNOSIS — I2510 Atherosclerotic heart disease of native coronary artery without angina pectoris: Secondary | ICD-10-CM | POA: Diagnosis not present

## 2020-08-10 DIAGNOSIS — E1165 Type 2 diabetes mellitus with hyperglycemia: Secondary | ICD-10-CM

## 2020-08-10 DIAGNOSIS — I6523 Occlusion and stenosis of bilateral carotid arteries: Secondary | ICD-10-CM | POA: Diagnosis not present

## 2020-08-10 DIAGNOSIS — Z8673 Personal history of transient ischemic attack (TIA), and cerebral infarction without residual deficits: Secondary | ICD-10-CM

## 2020-08-10 DIAGNOSIS — Z6839 Body mass index (BMI) 39.0-39.9, adult: Secondary | ICD-10-CM

## 2020-08-10 DIAGNOSIS — Z743 Need for continuous supervision: Secondary | ICD-10-CM | POA: Diagnosis not present

## 2020-08-10 DIAGNOSIS — R29700 NIHSS score 0: Secondary | ICD-10-CM | POA: Diagnosis present

## 2020-08-10 DIAGNOSIS — I110 Hypertensive heart disease with heart failure: Secondary | ICD-10-CM | POA: Diagnosis present

## 2020-08-10 DIAGNOSIS — R32 Unspecified urinary incontinence: Secondary | ICD-10-CM | POA: Diagnosis present

## 2020-08-10 DIAGNOSIS — I071 Rheumatic tricuspid insufficiency: Secondary | ICD-10-CM | POA: Diagnosis present

## 2020-08-10 DIAGNOSIS — Z833 Family history of diabetes mellitus: Secondary | ICD-10-CM

## 2020-08-10 DIAGNOSIS — Z888 Allergy status to other drugs, medicaments and biological substances status: Secondary | ICD-10-CM

## 2020-08-10 DIAGNOSIS — I4811 Longstanding persistent atrial fibrillation: Secondary | ICD-10-CM | POA: Diagnosis present

## 2020-08-10 DIAGNOSIS — I10 Essential (primary) hypertension: Secondary | ICD-10-CM | POA: Diagnosis not present

## 2020-08-10 DIAGNOSIS — Z7982 Long term (current) use of aspirin: Secondary | ICD-10-CM

## 2020-08-10 HISTORY — DX: Personal history of transient ischemic attack (TIA), and cerebral infarction without residual deficits: Z86.73

## 2020-08-10 LAB — I-STAT CHEM 8, ED
BUN: 28 mg/dL — ABNORMAL HIGH (ref 8–23)
Calcium, Ion: 1.29 mmol/L (ref 1.15–1.40)
Chloride: 105 mmol/L (ref 98–111)
Creatinine, Ser: 1 mg/dL (ref 0.44–1.00)
Glucose, Bld: 278 mg/dL — ABNORMAL HIGH (ref 70–99)
HCT: 36 % (ref 36.0–46.0)
Hemoglobin: 12.2 g/dL (ref 12.0–15.0)
Potassium: 3.9 mmol/L (ref 3.5–5.1)
Sodium: 138 mmol/L (ref 135–145)
TCO2: 22 mmol/L (ref 22–32)

## 2020-08-10 LAB — ECHOCARDIOGRAM COMPLETE
AR max vel: 0.73 cm2
AV Area VTI: 0.86 cm2
AV Area mean vel: 0.75 cm2
AV Mean grad: 42 mmHg
AV Peak grad: 76 mmHg
Ao pk vel: 4.36 m/s
Area-P 1/2: 3.48 cm2
Height: 61 in
MV M vel: 3.97 m/s
MV Peak grad: 63 mmHg
MV VTI: 1.76 cm2
Radius: 0.3 cm
S' Lateral: 2.8 cm
Single Plane A4C EF: 52.1 %
Weight: 3360 oz

## 2020-08-10 LAB — TROPONIN I (HIGH SENSITIVITY)
Troponin I (High Sensitivity): 862 ng/L (ref <18)
Troponin I (High Sensitivity): 823 ng/L (ref <18)
Troponin I (High Sensitivity): 851 ng/L (ref <18)

## 2020-08-10 LAB — DIFFERENTIAL
Abs Immature Granulocytes: 0.02 10*3/uL (ref 0.00–0.07)
Basophils Absolute: 0 10*3/uL (ref 0.0–0.1)
Basophils Relative: 0 %
Eosinophils Absolute: 0.1 10*3/uL (ref 0.0–0.5)
Eosinophils Relative: 1 %
Immature Granulocytes: 0 %
Lymphocytes Relative: 23 %
Lymphs Abs: 1.8 10*3/uL (ref 0.7–4.0)
Monocytes Absolute: 0.6 10*3/uL (ref 0.1–1.0)
Monocytes Relative: 7 %
Neutro Abs: 5.4 10*3/uL (ref 1.7–7.7)
Neutrophils Relative %: 69 %

## 2020-08-10 LAB — HEMOGLOBIN A1C
Hgb A1c MFr Bld: 7.4 % — ABNORMAL HIGH (ref 4.8–5.6)
Mean Plasma Glucose: 165.68 mg/dL

## 2020-08-10 LAB — URINALYSIS, ROUTINE W REFLEX MICROSCOPIC
Bilirubin Urine: NEGATIVE
Glucose, UA: >=500 mg/dL — AB
Hgb urine dipstick: NEGATIVE
Ketones, ur: NEGATIVE mg/dL
Leukocytes,Ua: NEGATIVE
Nitrite: NEGATIVE
Protein, ur: NEGATIVE mg/dL
Specific Gravity, Urine: 1.013 (ref 1.005–1.030)
pH: 5 (ref 5.0–8.0)

## 2020-08-10 LAB — CBC
HCT: 35.7 % — ABNORMAL LOW (ref 36.0–46.0)
Hemoglobin: 11.9 g/dL — ABNORMAL LOW (ref 12.0–15.0)
MCH: 32.5 pg (ref 26.0–34.0)
MCHC: 33.3 g/dL (ref 30.0–36.0)
MCV: 97.5 fL (ref 80.0–100.0)
Platelets: 229 10*3/uL (ref 150–400)
RBC: 3.66 MIL/uL — ABNORMAL LOW (ref 3.87–5.11)
RDW: 13.2 % (ref 11.5–15.5)
WBC: 7.9 10*3/uL (ref 4.0–10.5)
nRBC: 0 % (ref 0.0–0.2)

## 2020-08-10 LAB — GLUCOSE, CAPILLARY
Glucose-Capillary: 137 mg/dL — ABNORMAL HIGH (ref 70–99)
Glucose-Capillary: 132 mg/dL — ABNORMAL HIGH (ref 70–99)

## 2020-08-10 LAB — PROTIME-INR
INR: 1.1 (ref 0.8–1.2)
Prothrombin Time: 14.3 seconds (ref 11.4–15.2)

## 2020-08-10 LAB — COMPREHENSIVE METABOLIC PANEL
ALT: 17 U/L (ref 0–44)
AST: 27 U/L (ref 15–41)
Albumin: 3.5 g/dL (ref 3.5–5.0)
Alkaline Phosphatase: 42 U/L (ref 38–126)
Anion gap: 8 (ref 5–15)
BUN: 28 mg/dL — ABNORMAL HIGH (ref 8–23)
CO2: 23 mmol/L (ref 22–32)
Calcium: 9.9 mg/dL (ref 8.9–10.3)
Chloride: 105 mmol/L (ref 98–111)
Creatinine, Ser: 1.21 mg/dL — ABNORMAL HIGH (ref 0.44–1.00)
GFR, Estimated: 45 mL/min — ABNORMAL LOW (ref >60)
Glucose, Bld: 276 mg/dL — ABNORMAL HIGH (ref 70–99)
Potassium: 3.9 mmol/L (ref 3.5–5.1)
Sodium: 136 mmol/L (ref 135–145)
Total Bilirubin: 1.2 mg/dL (ref 0.3–1.2)
Total Protein: 6.4 g/dL — ABNORMAL LOW (ref 6.5–8.1)

## 2020-08-10 LAB — LIPID PANEL
Cholesterol: 99 mg/dL (ref 0–200)
HDL: 31 mg/dL — ABNORMAL LOW (ref >40)
LDL Cholesterol: 53 mg/dL (ref 0–99)
Total CHOL/HDL Ratio: 3.2 RATIO
Triglycerides: 76 mg/dL (ref <150)
VLDL: 15 mg/dL (ref 0–40)

## 2020-08-10 LAB — CBG MONITORING, ED
Glucose-Capillary: 270 mg/dL — ABNORMAL HIGH (ref 70–99)
Glucose-Capillary: 159 mg/dL — ABNORMAL HIGH (ref 70–99)
Glucose-Capillary: 108 mg/dL — ABNORMAL HIGH (ref 70–99)

## 2020-08-10 LAB — MAGNESIUM: Magnesium: 1.5 mg/dL — ABNORMAL LOW (ref 1.7–2.4)

## 2020-08-10 LAB — RAPID URINE DRUG SCREEN, HOSP PERFORMED
Amphetamines: NOT DETECTED
Barbiturates: NOT DETECTED
Benzodiazepines: NOT DETECTED
Cocaine: NOT DETECTED
Opiates: NOT DETECTED
Tetrahydrocannabinol: NOT DETECTED

## 2020-08-10 LAB — APTT: aPTT: 29 seconds (ref 24–36)

## 2020-08-10 LAB — ETHANOL: Alcohol, Ethyl (B): <10 mg/dL (ref <10)

## 2020-08-10 LAB — RESP PANEL BY RT-PCR (FLU A&B, COVID) ARPGX2
Influenza A by PCR: NEGATIVE
Influenza B by PCR: NEGATIVE
SARS Coronavirus 2 by RT PCR: NEGATIVE

## 2020-08-10 IMAGING — MR MR HEAD W/O CM
2 series · 36 of 48 positions shown · non-contrast
Comparison: No pertinent prior exam.

CLINICAL DATA: Transient ischemic attack.

EXAM:
MRI HEAD WITHOUT CONTRAST
MRA HEAD WITHOUT CONTRAST
TECHNIQUE: Multiplanar, multi-echo pulse sequences of the brain and surrounding
structures were acquired without intravenous contrast. Angiographic
images of the Circle of Willis were acquired using MRA technique
without intravenous contrast.

[Series 1: aahead_scout · sagittal · 1.6mm · 1.62mm/px · 20 of 128 slices shown]
[im 1/128]
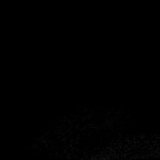
[im 7/128]
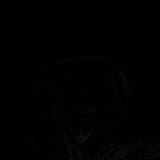
[im 14/128]
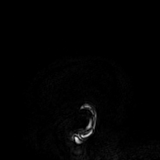
[im 21/128]
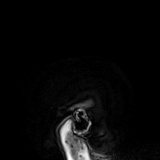
[im 27/128]
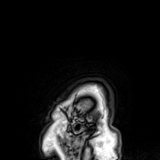
[im 34/128]
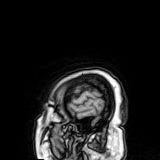
[im 41/128]
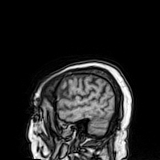
[im 47/128]
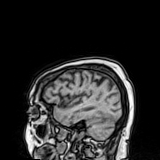
[im 54/128]
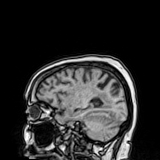
[im 61/128]
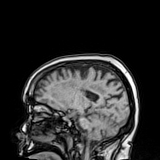
[im 67/128]
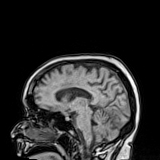
[im 74/128]
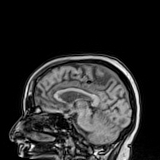
[im 81/128]
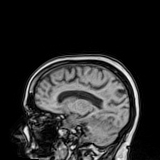
[im 87/128]
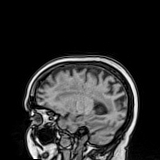
[im 94/128]
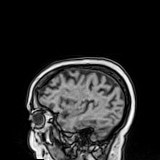
[im 101/128]
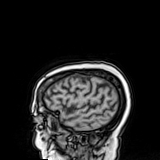
[im 107/128]
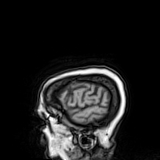
[im 114/128]
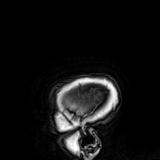
[im 121/128]
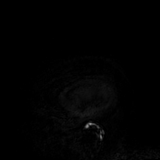
[im 128/128]
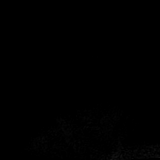

[Series 9: 3d cow · axial · 0.5mm · 0.41mm/px · z∈[-68,+12]mm · 16 of 172 slices shown]
[im 1/172]
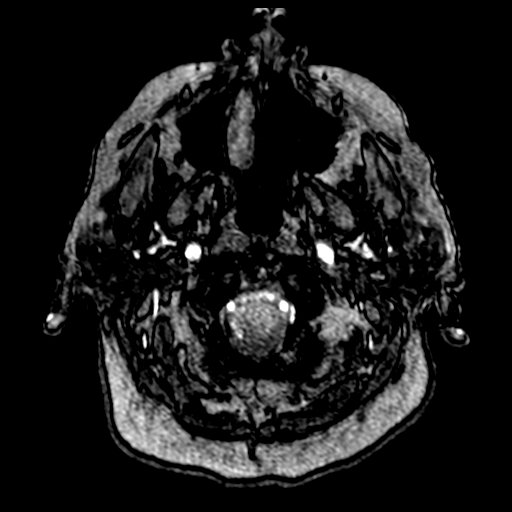
[im 7/172]
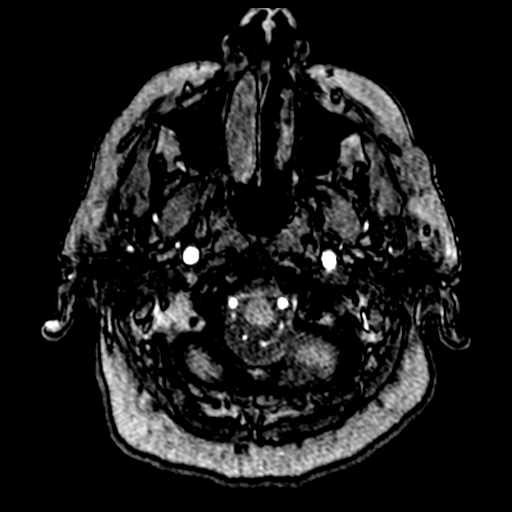
[im 13/172]
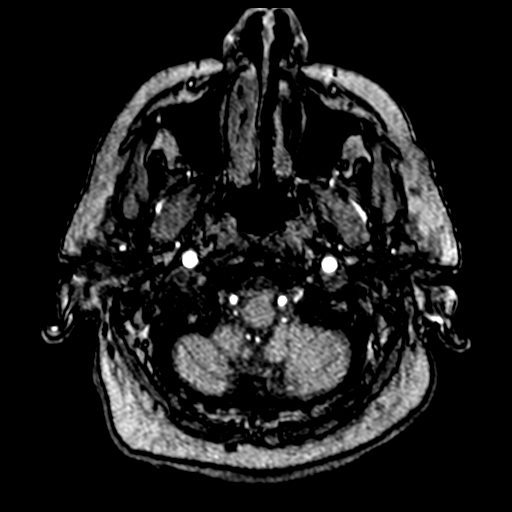
[im 20/172]
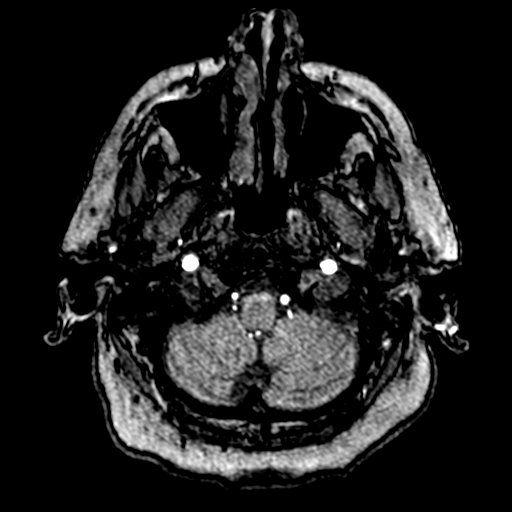
[im 26/172]
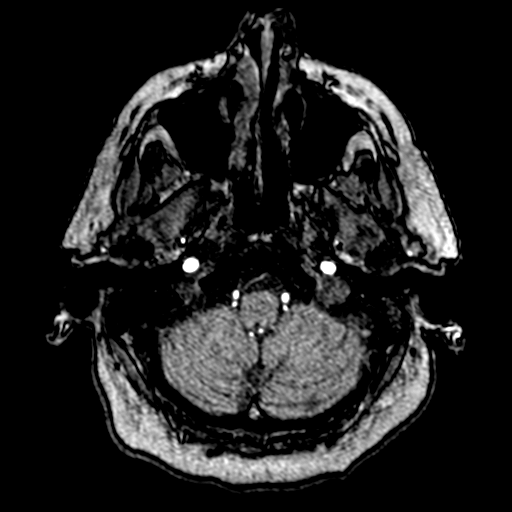
[im 32/172]
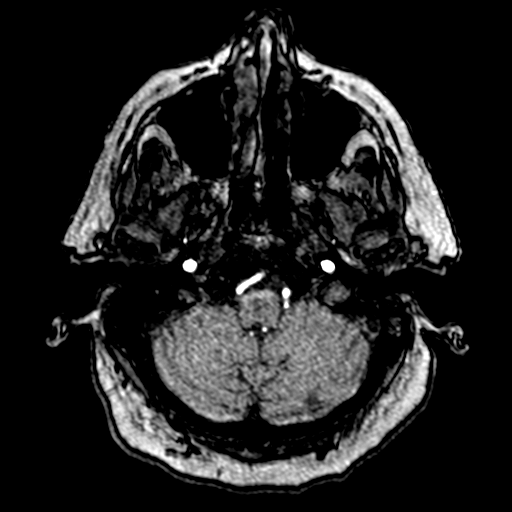
[im 39/172]
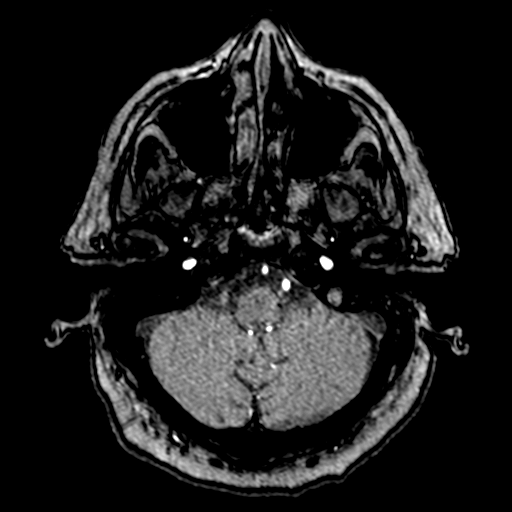
[im 45/172]
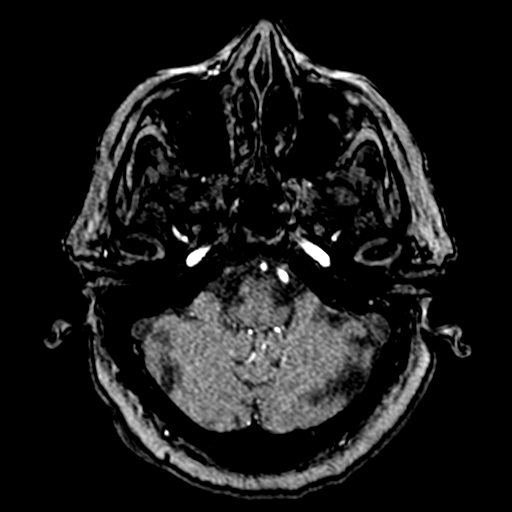
[im 51/172]
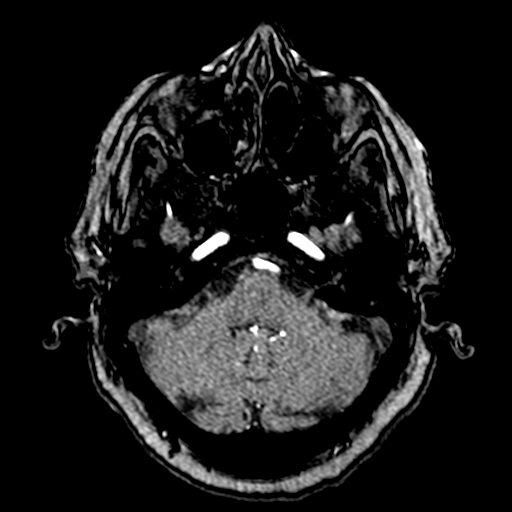
[im 77/172]
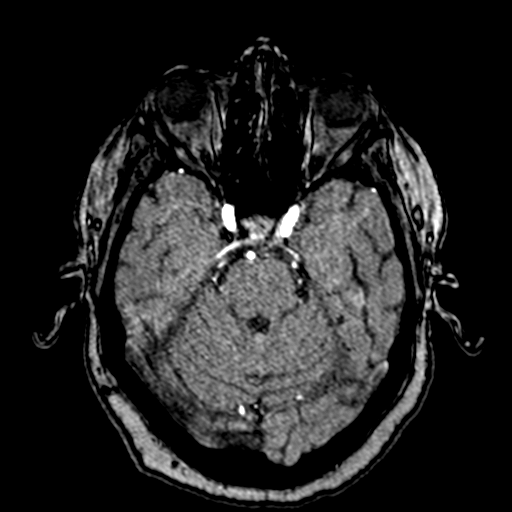
[im 89/172]
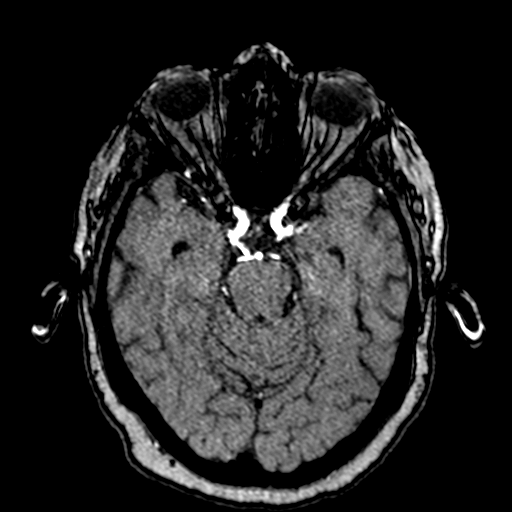
[im 96/172]
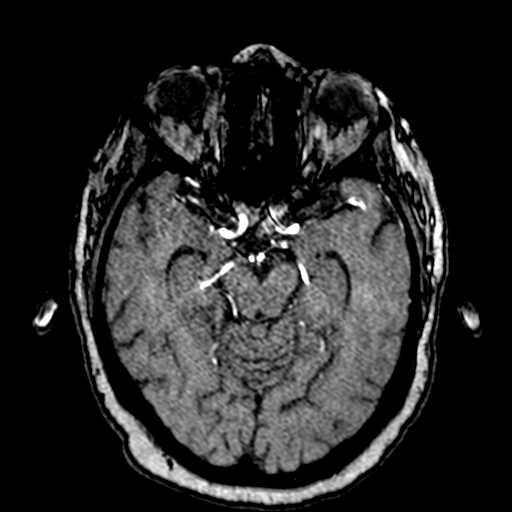
[im 121/172]
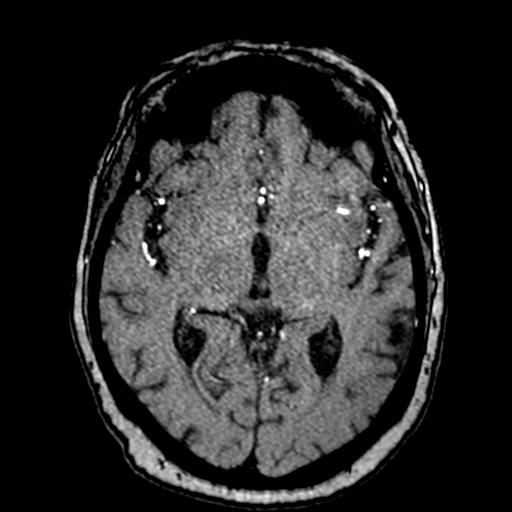
[im 140/172]
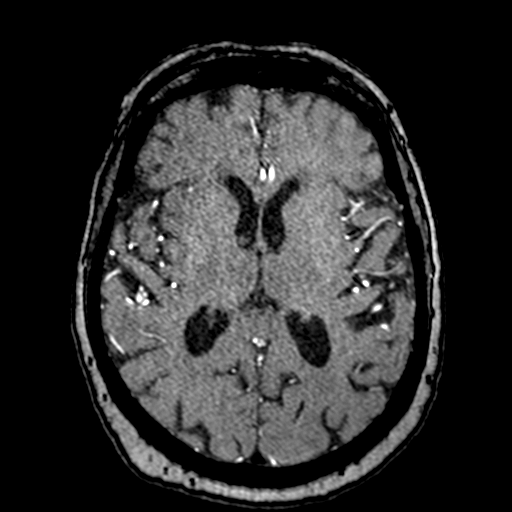
[im 146/172]
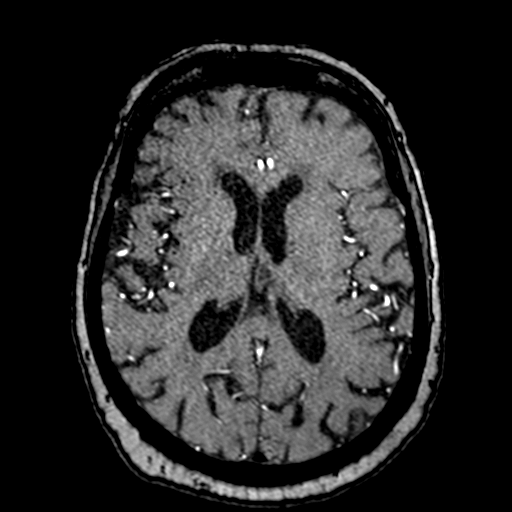
[im 165/172]
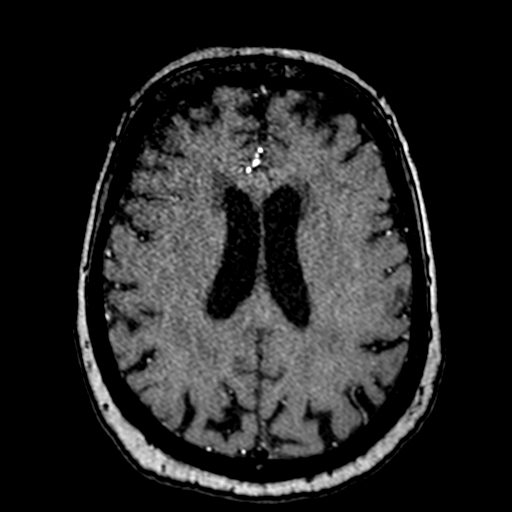

[36 of 48 positions shown; findings below may reference images not displayed]

FINDINGS: MRI HEAD FINDINGS

Brain: Small acute infarcts in the high right posterior frontal
white matter, left caudate, right thalamus, and bilateral cerebellar
hemispheres. No substantial edema or mass effect. Severe patchy
fluid T2/FLAIR hyperintensities within the white matter, nonspecific
but most likely related to chronic microvascular ischemic disease.
Remote lacunar infarcts involving bilateral cerebellar hemispheres,
right thalamus, and left corona radiata. Mild generalized atrophy
with ex vacuo ventricular dilation. No hydrocephalus, acute
hemorrhage, mass lesion, midline shift, or extra-axial fluid
collection.

Vascular: See below.

Skull and upper cervical spine: Normal marrow signal.

Sinuses/Orbits: Mild mucosal thickening of scattered ethmoid air
cells.

Other: No mastoid effusions.

MRA HEAD FINDINGS

Anterior circulation: Bilateral ICAs, MCAs, and ACAs are patent
without flow-limiting proximal stenosis. No aneurysm identified.

Posterior circulation: Bilateral visualized intradural vertebral
arteries, basilar artery, and posterior cerebral arteries are patent
without flow-limiting proximal stenosis. Right larger than left
posterior communicating arteries. No aneurysm identified.

Anatomic variants: See above.
IMPRESSION: MRI:

1. Small acute infarcts in the high right posterior frontal white
matter, left caudate, right thalamus, and bilateral cerebellar
hemispheres. No substantial edema or mass effect. Given involvement
of multiple vascular territories, consider a central embolic
etiology.
2. Severe chronic microvascular ischemic disease and remote lacunar
infarcts.

MRA:

No large vessel occlusion or proximal flow limiting stenosis.

## 2020-08-10 IMAGING — MR MR MRA HEAD W/O CM
11 of 13 series · 33 of 48 positions shown · non-contrast
Comparison: No pertinent prior exam.

CLINICAL DATA: Transient ischemic attack.

EXAM:
MRI HEAD WITHOUT CONTRAST
MRA HEAD WITHOUT CONTRAST
TECHNIQUE: Multiplanar, multi-echo pulse sequences of the brain and surrounding
structures were acquired without intravenous contrast. Angiographic
images of the Circle of Willis were acquired using MRA technique
without intravenous contrast.

[Series 5: DWI · axial · 3.0mm · 0.88mm/px · z∈[-66,+73]mm · 7 of 96 slices shown (1 of 4)]
[im 1/96]
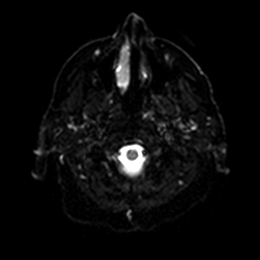
[im 16/96]
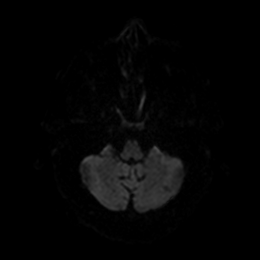
[im 32/96]
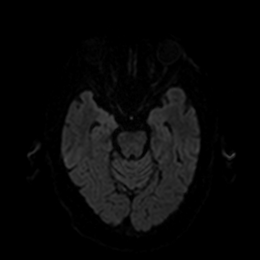
[im 48/96]
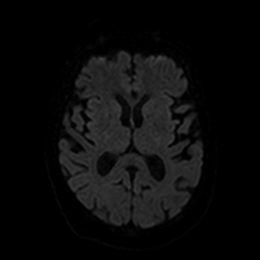
[im 64/96]
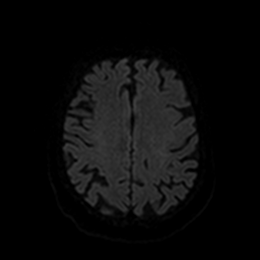
[im 80/96]
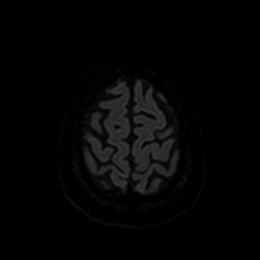
[im 96/96]
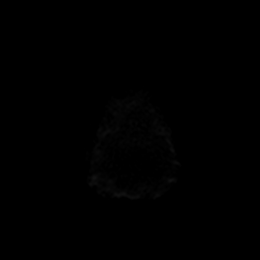

[Series 6: DWI · axial · 3.0mm · 0.88mm/px · z∈[-66,+73]mm · 3 of 48 slices shown (2 of 4)]
[im 1/48]
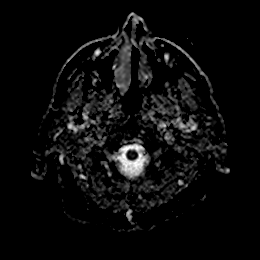
[im 24/48]
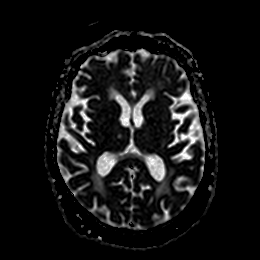
[im 48/48]
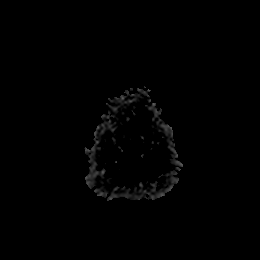

[Series 7: DWI · coronal · 4.0mm · 0.88mm/px · 4 of 64 slices shown (3 of 4)]
[im 1/64]
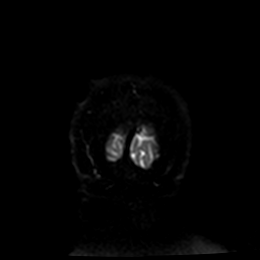
[im 22/64]
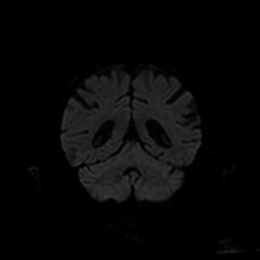
[im 43/64]
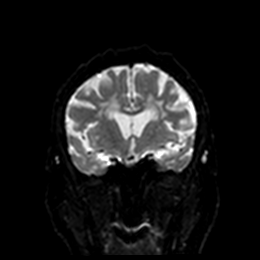
[im 64/64]
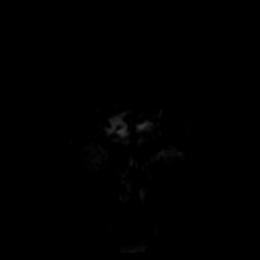

[Series 8: DWI · coronal · 4.0mm · 0.88mm/px · 2 of 32 slices shown (4 of 4)]
[im 1/32]
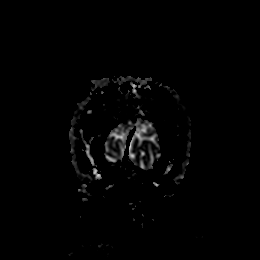
[im 32/32]
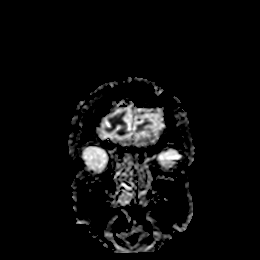

[Series 13: T1 · sagittal · 5.0mm · 0.75mm/px · 2 of 23 slices shown]
[im 1/23]
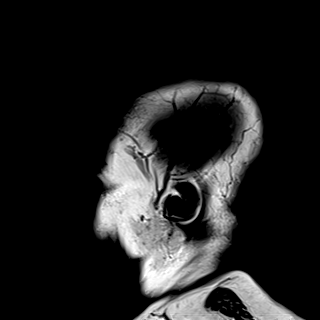
[im 23/23]
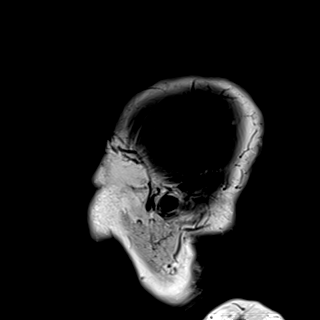

[Series 14: T2 · axial · 5.0mm · 0.72mm/px · z∈[-61,+74]mm · 2 of 24 slices shown (1 of 2)]
[im 1/24]
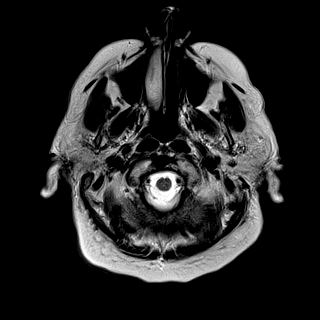
[im 24/24]
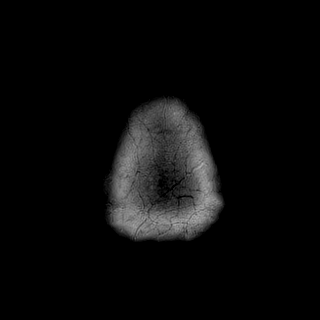

[Series 15: FLAIR · axial · 5.0mm · 0.45mm/px · z∈[-65,+69]mm · 2 of 24 slices shown]
[im 1/24]
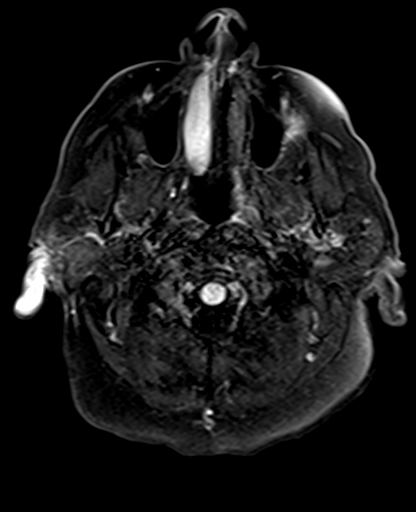
[im 24/24]
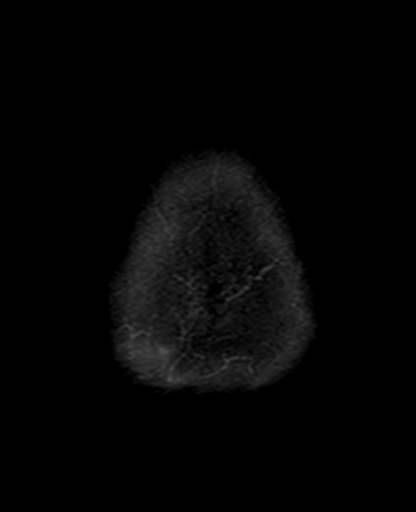

[Series 16: mag_images · axial · 3.0mm · 0.90mm/px · z∈[-70,+69]mm · 3 of 48 slices shown]
[im 1/48]
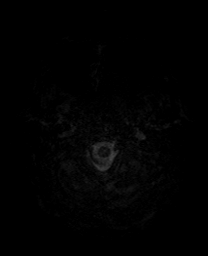
[im 24/48]
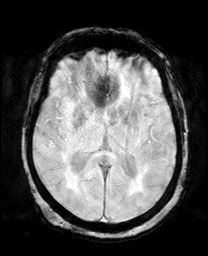
[im 48/48]
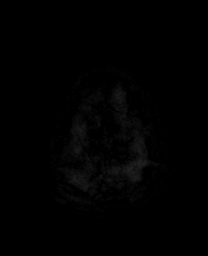

[Series 17: pha_images · axial · 3.0mm · 0.90mm/px · z∈[-70,+69]mm · 3 of 48 slices shown]
[im 1/48]
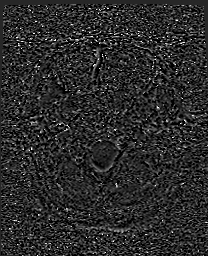
[im 24/48]
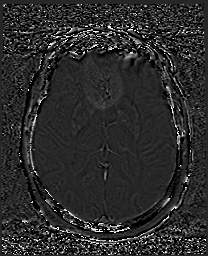
[im 48/48]
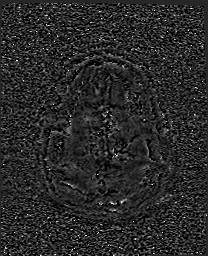

[Series 19: mip_images(sw) · axial · 24.0mm · 0.90mm/px · z∈[-59,+59]mm · 3 of 41 slices shown]
[im 1/41]
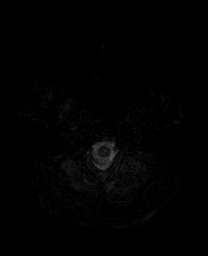
[im 21/41]
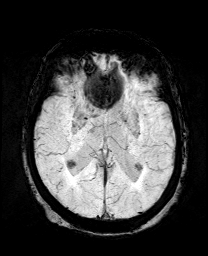
[im 41/41]
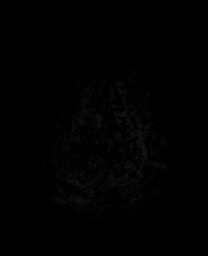

[Series 21: T2 · coronal · 5.0mm · 0.34mm/px · 2 of 28 slices shown (2 of 2)]
[im 1/28]
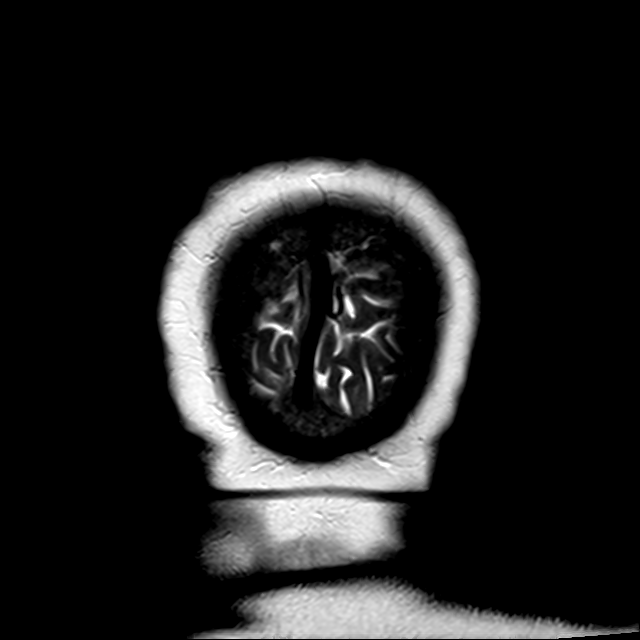
[im 28/28]
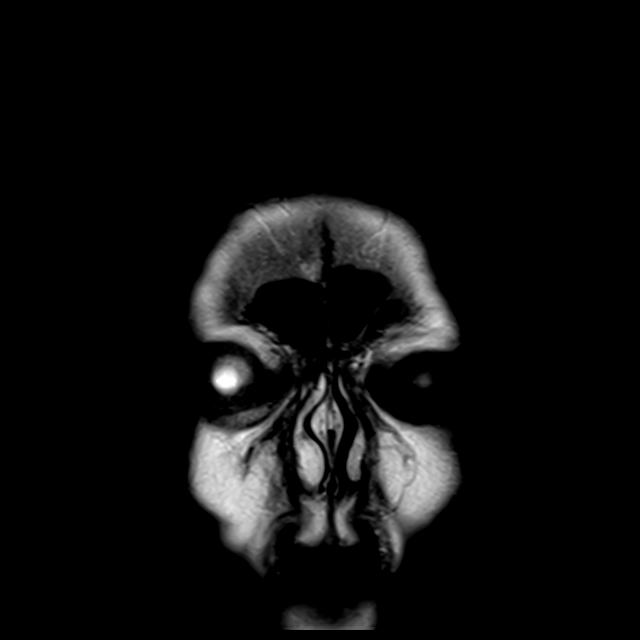

[33 of 48 positions shown; findings below may reference images not displayed]

FINDINGS: MRI HEAD FINDINGS

Brain: Small acute infarcts in the high right posterior frontal
white matter, left caudate, right thalamus, and bilateral cerebellar
hemispheres. No substantial edema or mass effect. Severe patchy
fluid T2/FLAIR hyperintensities within the white matter, nonspecific
but most likely related to chronic microvascular ischemic disease.
Remote lacunar infarcts involving bilateral cerebellar hemispheres,
right thalamus, and left corona radiata. Mild generalized atrophy
with ex vacuo ventricular dilation. No hydrocephalus, acute
hemorrhage, mass lesion, midline shift, or extra-axial fluid
collection.

Vascular: See below.

Skull and upper cervical spine: Normal marrow signal.

Sinuses/Orbits: Mild mucosal thickening of scattered ethmoid air
cells.

Other: No mastoid effusions.

MRA HEAD FINDINGS

Anterior circulation: Bilateral ICAs, MCAs, and ACAs are patent
without flow-limiting proximal stenosis. No aneurysm identified.

Posterior circulation: Bilateral visualized intradural vertebral
arteries, basilar artery, and posterior cerebral arteries are patent
without flow-limiting proximal stenosis. Right larger than left
posterior communicating arteries. No aneurysm identified.

Anatomic variants: See above.
IMPRESSION: MRI:

1. Small acute infarcts in the high right posterior frontal white
matter, left caudate, right thalamus, and bilateral cerebellar
hemispheres. No substantial edema or mass effect. Given involvement
of multiple vascular territories, consider a central embolic
etiology.
2. Severe chronic microvascular ischemic disease and remote lacunar
infarcts.

MRA:

No large vessel occlusion or proximal flow limiting stenosis.

## 2020-08-10 IMAGING — DX DG CHEST 1V PORT
1 series · 1 of 1 positions shown · non-contrast
Comparison: [DATE]

CLINICAL DATA: Atrial fibrillation

EXAM:
PORTABLE CHEST 1 VIEW

[chest ap]
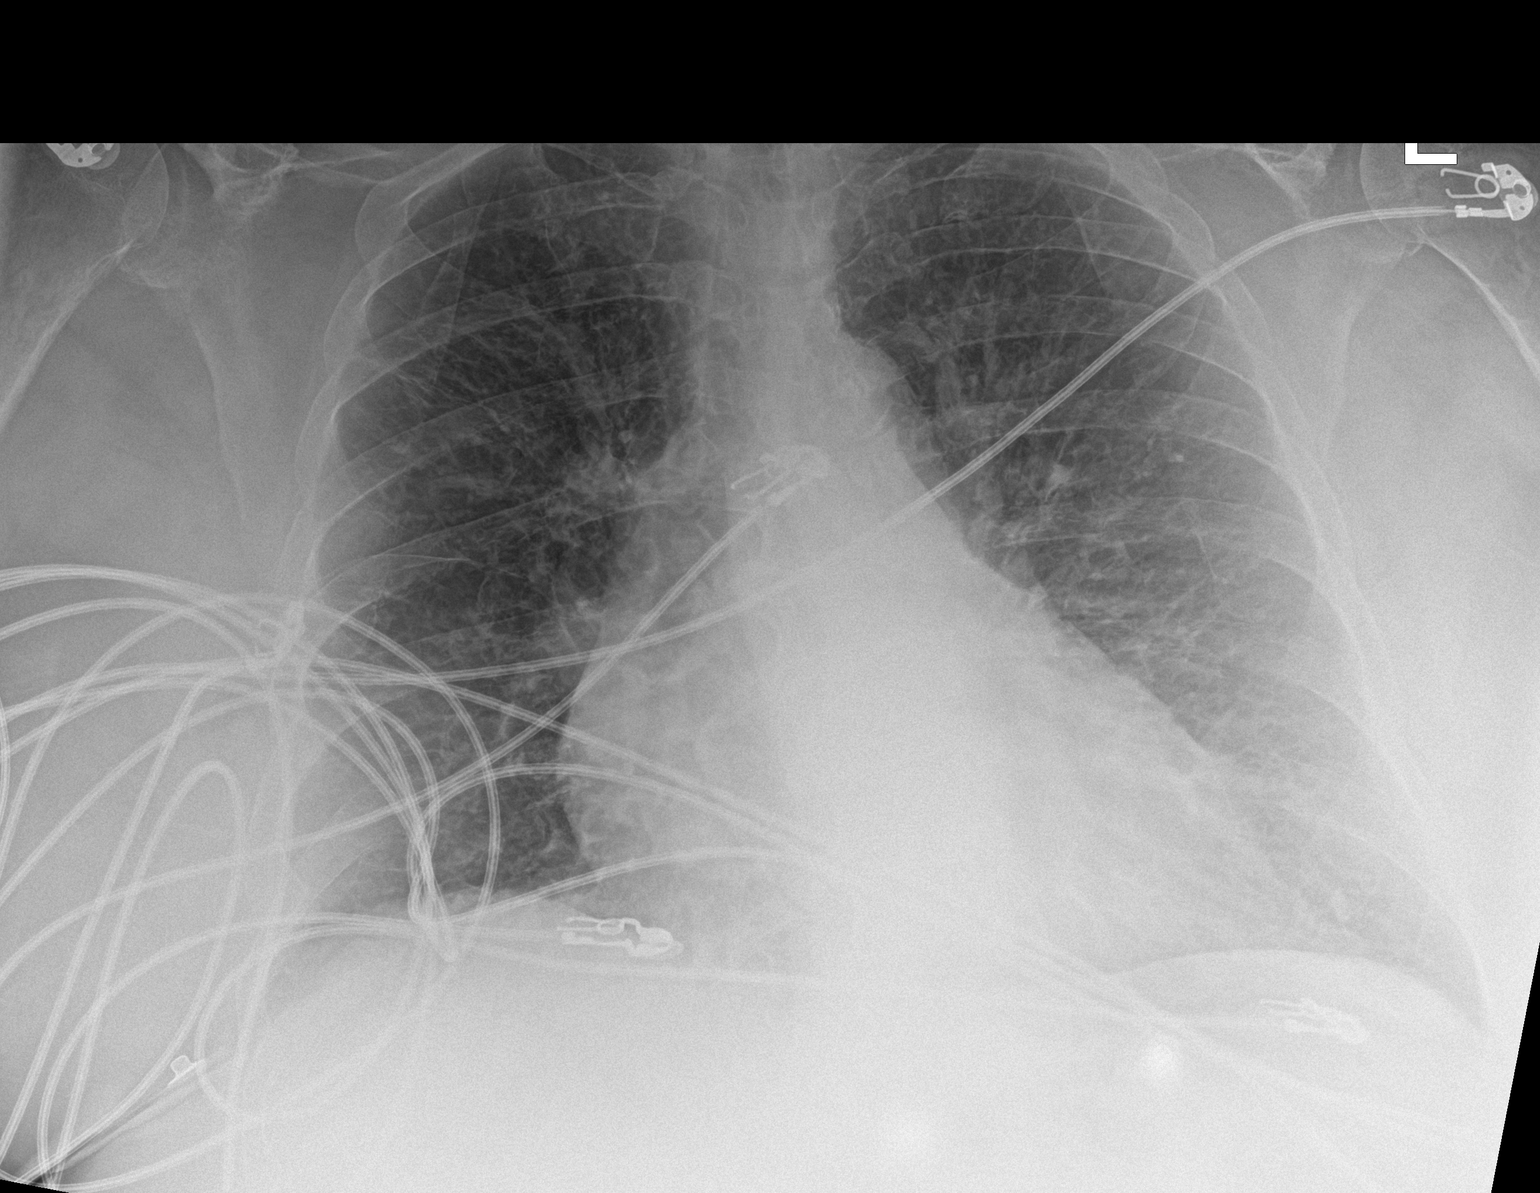

[1 of 1 positions shown; findings below may reference images not displayed]

FINDINGS: The heart size and mediastinal contours are within normal limits.
Both lungs are clear. The visualized skeletal structures are
unremarkable.
IMPRESSION: No active disease.

## 2020-08-10 IMAGING — CT CT HEAD W/O CM
4 series · 17 of 47 positions shown, 19 images · non-contrast
Comparison: None.

CLINICAL DATA: TIA

EXAM:
CT HEAD WITHOUT CONTRAST
TECHNIQUE: Contiguous axial images were obtained from the base of the skull
through the vertex without intravenous contrast.

[Series 3: head wo · axial · 0.42mm/px · z∈[-146,-36]mm · 6 of 32 slices shown, 8 images]
[im 5/32  brain]
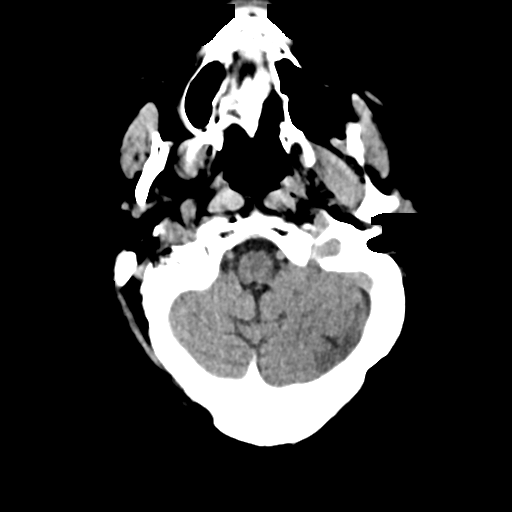
[im 5/32  bone]
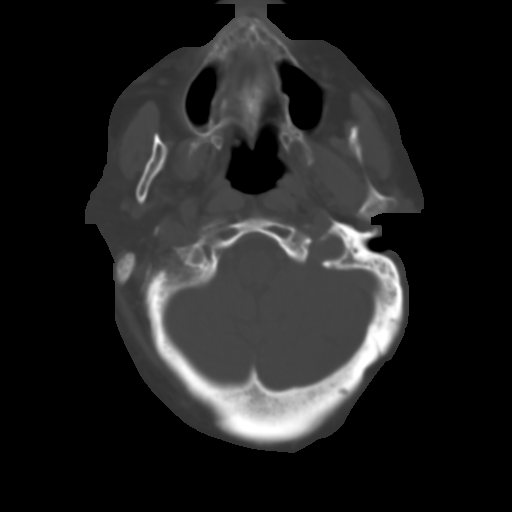
[im 9/32  brain]
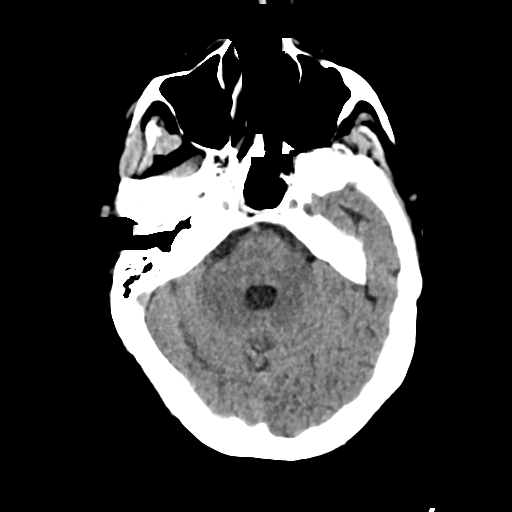
[im 14/32  brain]
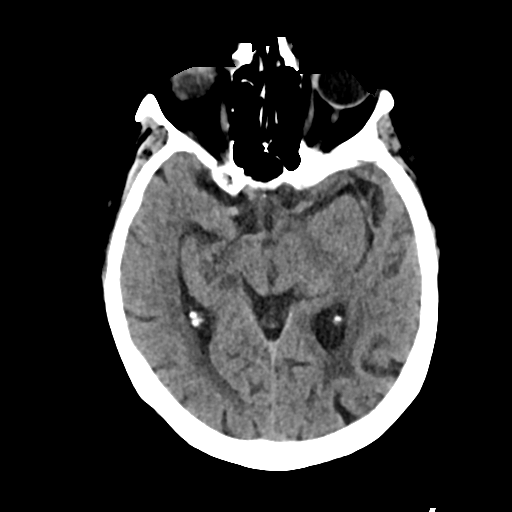
[im 18/32  brain]
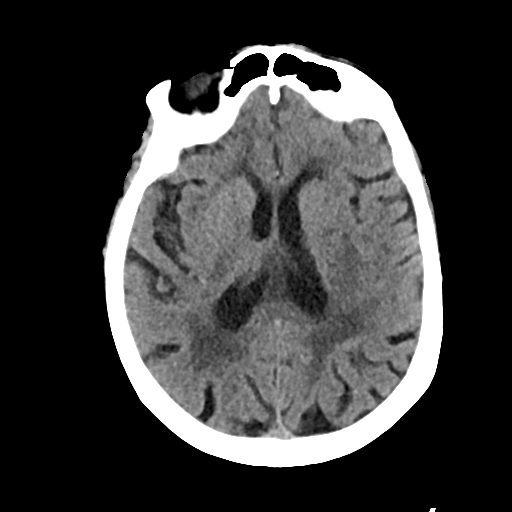
[im 23/32  brain]
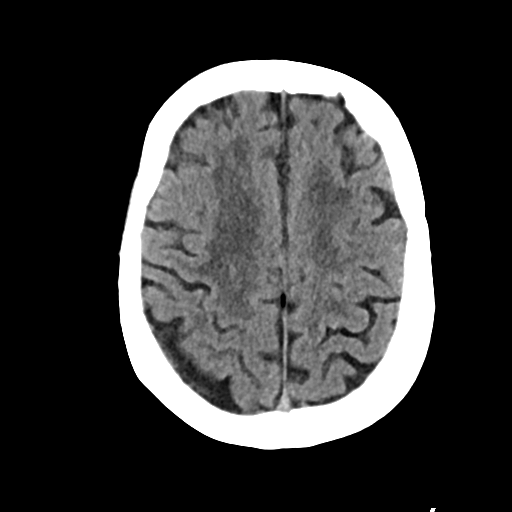
[im 23/32  bone]
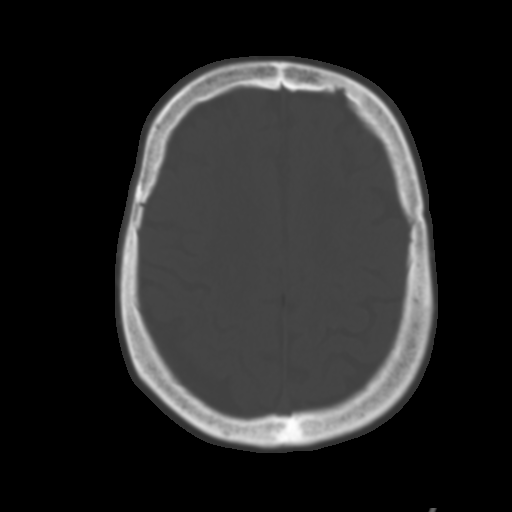
[im 27/32  brain]
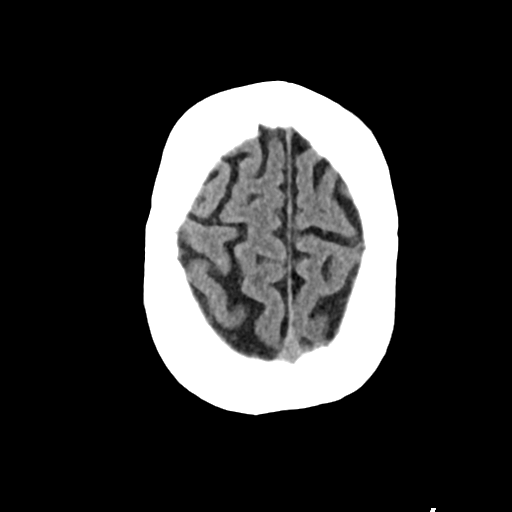

[Series 4: head bone · axial · 0.42mm/px · z∈[-152,-72]mm · 5 of 84 slices shown]
[im 8/84  bone]
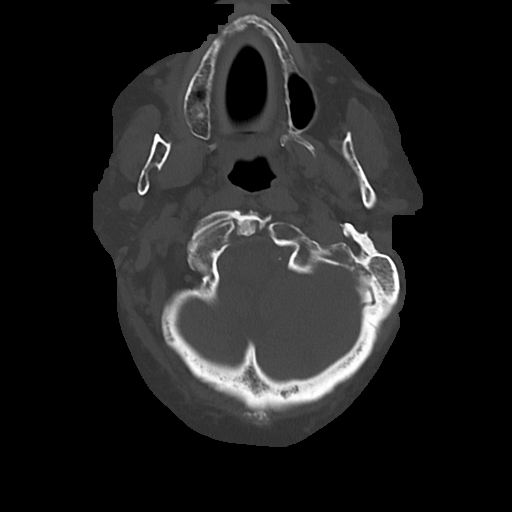
[im 16/84  bone]
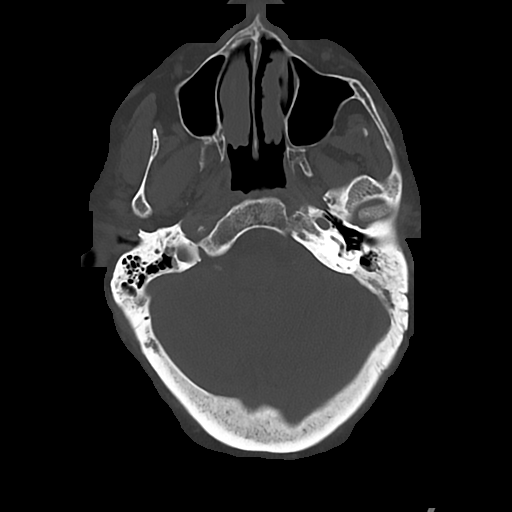
[im 28/84  bone]
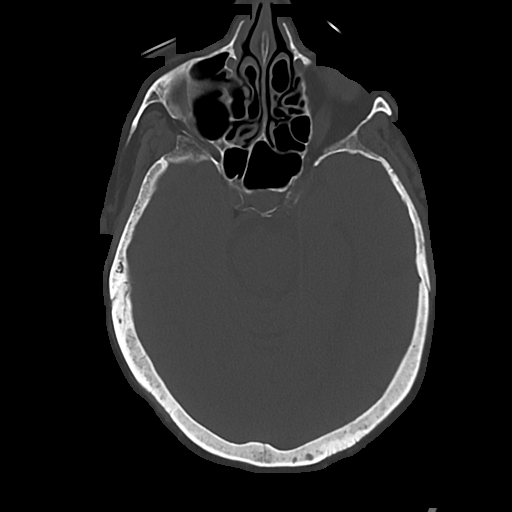
[im 36/84  bone]
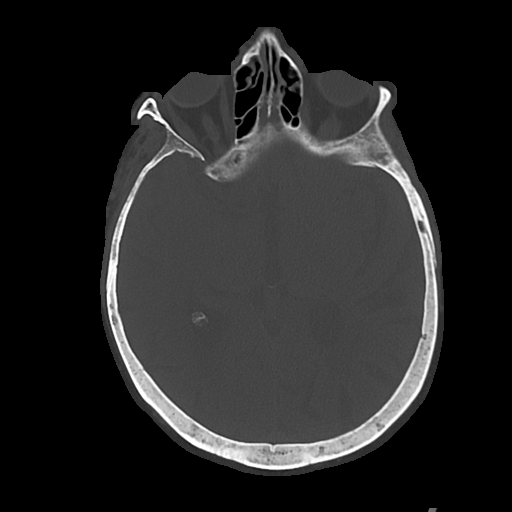
[im 48/84  bone]
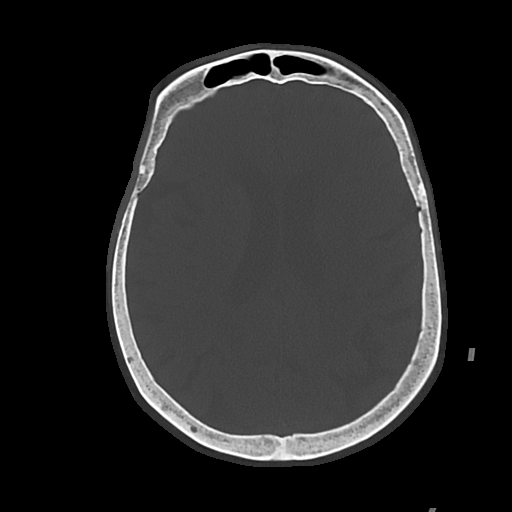

[Series 5: cor soft · coronal · 0.35mm/px · 3 of 70 slices shown]
[im 24/70  brain]
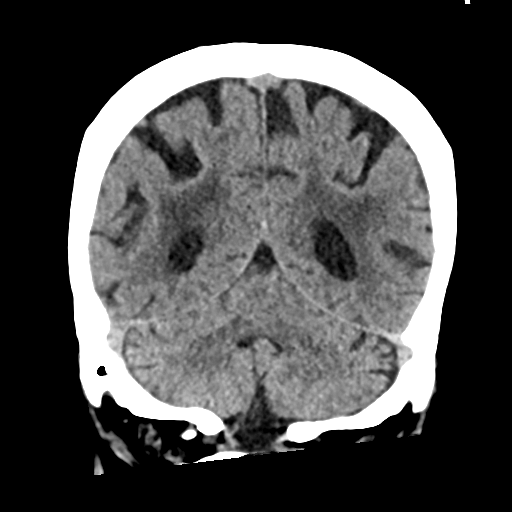
[im 31/70  brain]
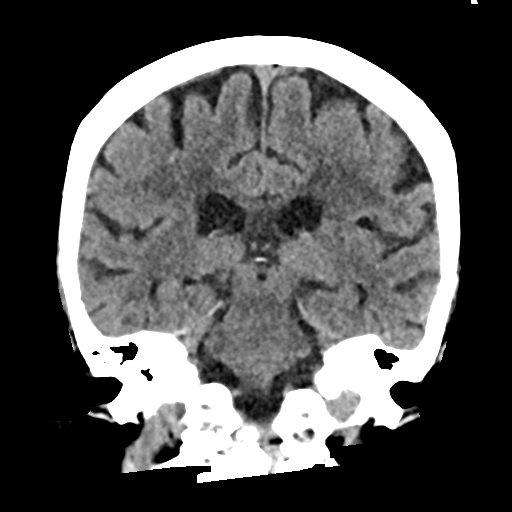
[im 39/70  brain]
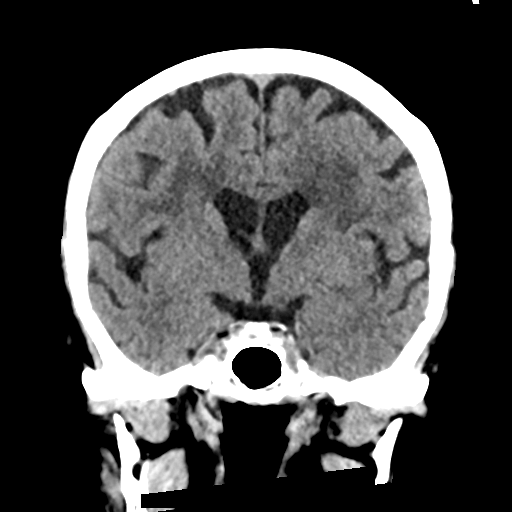

[Series 6: sag soft · sagittal · 0.34mm/px · 3 of 60 slices shown]
[im 21/60  brain]
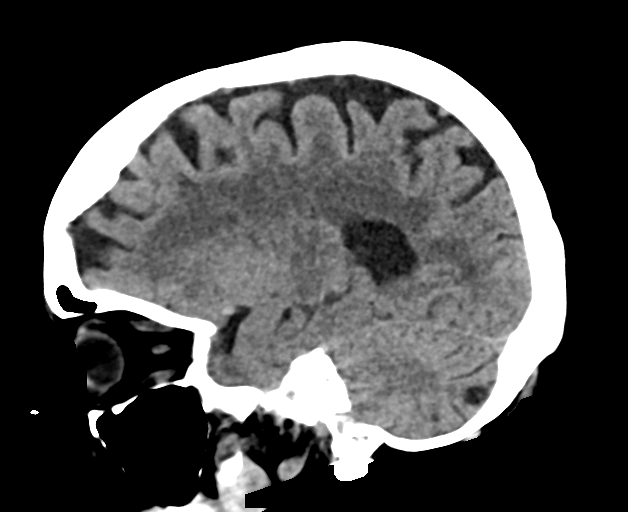
[im 30/60  brain]
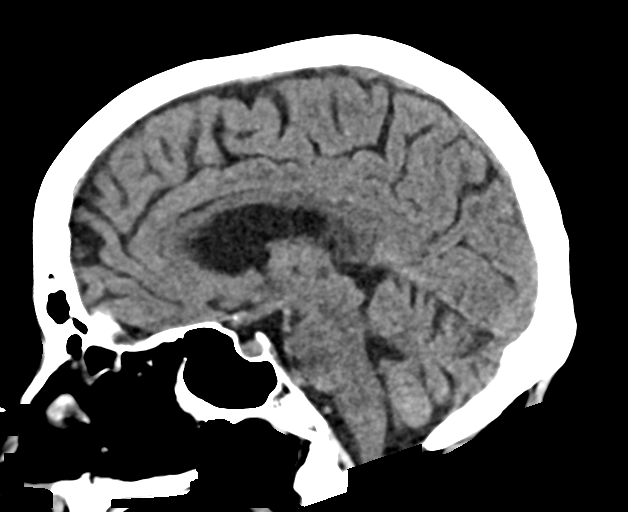
[im 40/60  brain]
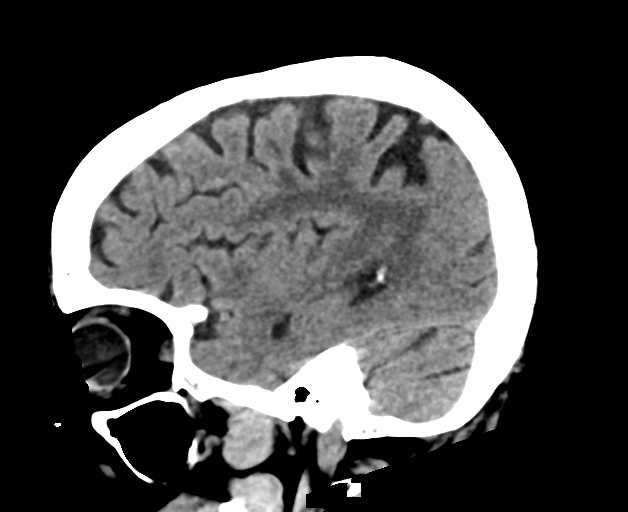

[17 of 47 positions shown; findings below may reference images not displayed]

FINDINGS: Brain: No evidence of acute infarction, hemorrhage, hydrocephalus,
extra-axial collection or mass lesion/mass effect.

Subcortical white matter and periventricular small vessel ischemic
changes.

Vascular: Intracranial atherosclerosis.

Skull: Normal. Negative for fracture or focal lesion.

Sinuses/Orbits: The visualized paranasal sinuses are essentially
clear. The mastoid air cells are unopacified.

Other: None.
IMPRESSION: No evidence of acute intracranial abnormality. Small vessel ischemic
changes.

## 2020-08-10 MED ORDER — DILTIAZEM HCL-DEXTROSE 125-5 MG/125ML-% IV SOLN (PREMIX)
5.0000 mg/h | INTRAVENOUS | Status: DC
Start: 2020-08-10 — End: 2020-08-10
  Administered 2020-08-10: 5 mg/h via INTRAVENOUS
  Filled 2020-08-10: qty 125

## 2020-08-10 MED ORDER — ACETAMINOPHEN 325 MG PO TABS
650.0000 mg | ORAL_TABLET | ORAL | Status: DC | PRN
  Administered 2020-08-10: 650 mg via ORAL
  Filled 2020-08-10: qty 2

## 2020-08-10 MED ORDER — MAGNESIUM SULFATE 2 GM/50ML IV SOLN
2.0000 g | Freq: Once | INTRAVENOUS | Status: AC
  Administered 2020-08-10: 2 g via INTRAVENOUS
  Filled 2020-08-10: qty 50

## 2020-08-10 MED ORDER — STROKE: EARLY STAGES OF RECOVERY BOOK
Freq: Once | Status: AC
  Administered 2020-08-12: 1
  Filled 2020-08-10: qty 1

## 2020-08-10 MED ORDER — ONDANSETRON HCL 4 MG/2ML IJ SOLN: 4.0000 mg | Freq: Four times a day (QID) | INTRAMUSCULAR | Status: DC | PRN

## 2020-08-10 MED ORDER — PANTOPRAZOLE SODIUM 40 MG PO TBEC
40.0000 mg | DELAYED_RELEASE_TABLET | Freq: Every day | ORAL | Status: DC
  Administered 2020-08-10 – 2020-08-12 (×3): 40 mg via ORAL
  Filled 2020-08-10 (×3): qty 1

## 2020-08-10 MED ORDER — APIXABAN 5 MG PO TABS
5.0000 mg | ORAL_TABLET | Freq: Two times a day (BID) | ORAL | Status: DC
  Administered 2020-08-10 – 2020-08-12 (×4): 5 mg via ORAL
  Filled 2020-08-10 (×4): qty 1

## 2020-08-10 MED ORDER — INSULIN ASPART 100 UNIT/ML IJ SOLN
0.0000 [IU] | Freq: Three times a day (TID) | INTRAMUSCULAR | Status: DC
  Administered 2020-08-10: 2 [IU] via SUBCUTANEOUS
  Administered 2020-08-10: 3 [IU] via SUBCUTANEOUS
  Administered 2020-08-10 – 2020-08-11 (×2): 2 [IU] via SUBCUTANEOUS
  Administered 2020-08-11: 3 [IU] via SUBCUTANEOUS
  Administered 2020-08-11: 2 [IU] via SUBCUTANEOUS
  Administered 2020-08-11: 3 [IU] via SUBCUTANEOUS
  Administered 2020-08-12 (×2): 2 [IU] via SUBCUTANEOUS

## 2020-08-10 MED ORDER — CARVEDILOL 6.25 MG PO TABS
6.2500 mg | ORAL_TABLET | Freq: Two times a day (BID) | ORAL | Status: DC
  Administered 2020-08-10 – 2020-08-12 (×5): 6.25 mg via ORAL
  Filled 2020-08-10: qty 1
  Filled 2020-08-10: qty 2
  Filled 2020-08-10 (×3): qty 1

## 2020-08-10 MED ORDER — DILTIAZEM LOAD VIA INFUSION
10.0000 mg | Freq: Once | INTRAVENOUS | Status: AC
  Administered 2020-08-10: 10 mg via INTRAVENOUS
  Filled 2020-08-10: qty 10

## 2020-08-10 MED ORDER — IRBESARTAN 300 MG PO TABS
300.0000 mg | ORAL_TABLET | Freq: Every day | ORAL | Status: DC
  Administered 2020-08-10 – 2020-08-12 (×3): 300 mg via ORAL
  Filled 2020-08-10 (×3): qty 1

## 2020-08-10 MED ORDER — ASPIRIN EC 81 MG PO TBEC
81.0000 mg | DELAYED_RELEASE_TABLET | Freq: Every day | ORAL | Status: DC
  Administered 2020-08-10 – 2020-08-12 (×3): 81 mg via ORAL
  Filled 2020-08-10 (×3): qty 1

## 2020-08-10 MED ORDER — CLONAZEPAM 0.5 MG PO TABS
0.5000 mg | ORAL_TABLET | Freq: Two times a day (BID) | ORAL | Status: DC | PRN
  Administered 2020-08-10 – 2020-08-11 (×2): 0.5 mg via ORAL
  Filled 2020-08-10 (×2): qty 1

## 2020-08-10 MED ORDER — INSULIN ASPART 100 UNIT/ML IJ SOLN: 0.0000 [IU] | Freq: Every day | INTRAMUSCULAR | Status: DC

## 2020-08-10 MED ORDER — ATORVASTATIN CALCIUM 40 MG PO TABS
40.0000 mg | ORAL_TABLET | Freq: Every day | ORAL | Status: DC
  Administered 2020-08-10 – 2020-08-12 (×3): 40 mg via ORAL
  Filled 2020-08-10 (×3): qty 1

## 2020-08-10 MED ORDER — MAGNESIUM SULFATE IN D5W 1-5 GM/100ML-% IV SOLN
1.0000 g | Freq: Once | INTRAVENOUS | Status: AC
  Administered 2020-08-10: 1 g via INTRAVENOUS
  Filled 2020-08-10: qty 100

## 2020-08-10 NOTE — Progress Notes (Signed)
Carotid duplex bilateral study completed.   Please see CV Proc for preliminary results.   Eldean Klatt, RDMS, RVT  

## 2020-08-10 NOTE — Evaluation (Signed)
Physical Therapy Evaluation Patient Details Name: Mary Mccarthy MRN: [id number] DOB: Apr 05, 1940 Today's Date: 08/10/2020   History of Present Illness  Pt is 32 female who presented with chest pressure and slurred speech on 08/10/20.  She was found to be in Afib with RVR with waxing/waning neurological symptoms.  MRI did reveal small diffuse bilateral acute infarcts. Pt with medical history including anemia, diastolic heart failure, and DM2.   Clinical Impression  Pt admitted with above diagnosis. Normally, pt lives with her son who works so she is home alone frequently.  She is normally independent with ADLs/IADLS/Driving and community ambulation (does use AD at times in community).  Today, pt demonstrate slight deficit in L UE strength compared to R UE, slight L facial droop, and decreased strength in bil hips.  Pt also with decreased balance with ambulation.  She is expected to progress well. She ambulated 18' in room with HHA; however, had increased forward momentum -recommended use of RW. Pt currently with functional limitations due to the deficits listed below (see PT Problem List). Pt will benefit from skilled PT to increase their independence and safety with mobility to allow discharge to the venue listed below.       Follow Up Recommendations Home health PT;Supervision - Intermittent    Equipment Recommendations  None recommended by PT (has DME)    Recommendations for Other Services       Precautions / Restrictions Precautions Precautions: Fall      Mobility  Bed Mobility Overal bed mobility: Needs Assistance Bed Mobility: Supine to Sit;Sit to Supine     Supine to sit: Mod assist Sit to supine: Mod assist   General bed mobility comments: Some limitations due to stretcher is very tall for pt and she sleeps on sofa at home; required step stool to get back on bed    Transfers Overall transfer level: Needs assistance Equipment used: 1 person hand held assist Transfers: Sit  to/from [employer] Transfers Sit to Stand: Min guard Stand pivot transfers: Min guard       General transfer comment: Min guard for safety  Ambulation/Gait Ambulation/Gait assistance: Min assist Gait Distance (Feet): 35 Feet Assistive device: 1 person hand held assist Gait Pattern/deviations: Step-to pattern;Decreased stride length     General Gait Details: Pt with tendency to have increased forward momentum- 1 episode requiring min A to recover, and 1 episode of pt holding onto counter to recover.  Suspect would improve with RW use.  Stairs Stairs: Yes Stairs assistance: Min assist Stair Management: Step to pattern Number of Stairs: 1 General stair comments: Used step stool with handle bar to get back on stretcher with min A to steady  Wheelchair Mobility    Modified Rankin (Stroke Patients Only) Modified Rankin (Stroke Patients Only) Pre-Morbid Rankin Score: No symptoms Modified Rankin: Moderate disability     Balance Overall balance assessment: Needs assistance   Sitting balance-[name]: Good     Standing balance support: Single extremity supported Standing balance-[name]: Fair Standing balance comment: Single UE support at times with ambulation and with increased forward momentum; static stand without assist                             Pertinent Vitals/Pain Pain Assessment: No/denies pain    Home Living Family/patient expects to be discharged to:: Private residence Living Arrangements: Children Available Help at Discharge: Family;Available PRN/intermittently (son lives with her but works) Type of Home: [employer]  Access: Stairs to enter Entrance Stairs-Rails: None Entrance Stairs-Number of Steps: 1 Home Layout: One level Home Equipment: Walker - 2 wheels;Cane - single point;Grab bars - tub/shower      Prior Function Level of Independence: Independent with assistive device(s)   Gait / Transfers Assistance Needed: Pt typically  ambulates without AD.  Does report if she goes out in community takes her cane for safety and holds onto grocery cart  ADL's / Homemaking Assistance Needed: indpendent with ADLs, IADLs, and drivintg        Hand Dominance        Extremity/Trunk Assessment   Upper Extremity Assessment Upper Extremity Assessment: LUE deficits/detail;RUE deficits/detail RUE Deficits / Details: ROM WFL; MMT: grossly 5/5 throughout RUE Sensation: WNL RUE Coordination: WNL LUE Deficits / Details: ROM WFL; MMT: grip 4/5, elbow 4/5, shoulder 5/5.  Mild deficits in L compared to R (does have hx of elbow surgery with radial head removed) LUE Sensation: WNL LUE Coordination: WNL    Lower Extremity Assessment Lower Extremity Assessment: LLE deficits/detail;RLE deficits/detail RLE Deficits / Details: ROM WFL; MMT: ankle and knee 5/5, hip 4/5 RLE Sensation: WNL RLE Coordination: WNL LLE Deficits / Details: ROM WFL; MMT: ankle and knee 5/5, hip 4/5 LLE Sensation: WNL LLE Coordination: WNL    Cervical / Trunk Assessment Cervical / Trunk Assessment: Normal Cervical / Trunk Exceptions: Did note mild facial droop on L  Communication   Communication: No difficulties  Cognition Arousal/Alertness: Awake/alert Behavior During Therapy: WFL for tasks assessed/performed Overall Cognitive Status: Within Functional Limits for tasks assessed                                        General Comments General comments (skin integrity, edema, etc.): Noted slight L facial droop.  Pt expressed concern that she cannot afford copays for therapy at discharge.    Exercises     Assessment/Plan    PT Assessment Patient needs continued PT services  PT Problem List Decreased strength;Decreased mobility;Decreased safety awareness;Decreased coordination;Decreased activity tolerance;Decreased balance;Decreased knowledge of use of DME;Cardiopulmonary status limiting activity       PT Treatment Interventions  DME instruction;Therapeutic exercise;Gait training;Balance training;Stair training;Neuromuscular re-education;Functional mobility training;Therapeutic activities;Patient/family education    PT Goals (Current goals can be found in the Care Plan section)  Acute Rehab PT Goals Patient Stated Goal: return home - concerned about copays for therapy PT Goal Formulation: With patient Time For Goal Achievement: 08/24/20 Potential to Achieve Goals: Good    Frequency Min 4X/week   Barriers to discharge Decreased caregiver support      Co-evaluation               AM-PAC PT "6 Clicks" Mobility  Outcome Measure Help needed turning from your back to your side while in a flat bed without using bedrails?: A Little Help needed moving from lying on your back to sitting on the side of a flat bed without using bedrails?: A Lot Help needed moving to and from a bed to a chair (including a wheelchair)?: A Little Help needed standing up from a chair using your arms (e.g., wheelchair or bedside chair)?: A Little Help needed to walk in hospital room?: A Little Help needed climbing 3-5 steps with a railing? : A Lot 6 Click Score: 16    End of Session Equipment Utilized During Treatment: Gait belt Activity Tolerance: Patient tolerated treatment well Patient left:  in bed;with call bell/phone within reach;with nursing/sitter in room (Pt in ED getting ready to go to room) Nurse Communication: Mobility status PT Visit Diagnosis: Unsteadiness on feet (R26.81);Other abnormalities of gait and mobility (R26.89);Muscle weakness (generalized) (M62.81)    Time: 2353-6144 PT Time Calculation (min) (ACUTE ONLY): 27 min   Charges:   PT Evaluation $PT Eval Low Complexity: 1 Low PT Treatments $Gait Training: 8-22 mins        Anise Salvo, PT Acute Rehab Services Pager [phone] [facility] [phone]   Rayetta Humphrey 08/10/2020, 3:37 PM

## 2020-08-10 NOTE — ED Notes (Signed)
Pt converted to sinus bradycardia. EKG resulted; will alert provider.

## 2020-08-10 NOTE — Consult Note (Signed)
NEUROLOGY CONSULTATION NOTE   Date of service: August 10, 2020 Patient Name: Mary Mccarthy MRN:  [id number] DOB:  [birth year] Reason for consult: "Episode of L foot weakness intermittent with speech difficulty, found to have new onset Afibb with RVR, symptoms resolved and back to baseline Requesting Provider: Shon Baton, MD _ _ _   _ __   _ __ _ _  __ __   _ __   __ _  History of Present Illness  Mary Mccarthy is a 80 y.o. female with PMH significant for CAD, HLD, GERD, MI, DJD, aortic stenosis, who presents with difficulty speaking and LLE weakness.  She woke up at 0500 on 08/09/20 with left leg weakness for most of the day. Went to bed at midnight. L foot was dragging, was taking longer to walk and difficult to turn. Son also noted an episode of speech difficulty. Was taking her much longer to answer, speech was garbled and what she was talking did not make much sense. She was brought in to the ED. In the ED, she was found to be in Afibb with RVR.  Neurology consulted for concern for potential Stroke/TIA.  She denies any history of strokes, endorses family history of strokes. Takes aspirin about 3 times a week.   On review of system, endorses urinary incontinence and cant hold it in. Has to go to the bathroom several times a day and wakes up multiple times in the middle of night to urinate. Denies any saddle anesthesia, no lhermitte's sign, no arm or leg numbness.   ROS   Constitutional Denies weight loss, fever and chills.   HEENT Denies changes in vision and hearing.   Respiratory Denies SOB and cough.   CV Denies palpitations and CP   GI Denies abdominal pain, nausea, vomiting and diarrhea.   GU Denies dysuria but endorses significant urinary frequency.   MSK Denies myalgia and joint pain.   Skin Denies rash and pruritus.  Neurological Denies headache and syncope.   Psychiatric Denies recent changes in mood. Denies anxiety and depression.    Past History   Past Medical  History:  Diagnosis Date   ALLERGIC RHINITIS 06/24/2007   Anemia    AVM (arteriovenous malformation) of colon    Blood transfusion without reported diagnosis 05/2015   COLONIC POLYPS, HX OF 06/24/2007   Coronary artery disease, non-occlusive 05/2015   Trop + w/ Acute Anemia =>CATH: small RI - Ostial 60%, ostial RCA 30% and dLAD 40-50%;; 9/'21: Cor Ca Score 624. Mild (25-49%) prox RCA & LAD,; Moderate (50-69%) Ostial Small RI & prox LCx.     DEPRESSION 10/08/2006   DIABETES MELLITUS, TYPE II 10/05/2006   GERD 06/24/2007   HYPERLIPIDEMIA 10/08/2006   HYPERTENSION 10/08/2006   INSOMNIA 09/24/2008   Left knee DJD    Moderate to severe aortic stenosis 05/2015   a) Mod AS (mean gras 22 mmHg, peak 42 mmHg -AVA 1.1-1.2 cm.);b) 04/2017: Severe Ca2+ AoV w/ Mod-Severe AS (p-m grad 60-34 mmHg -Moderate), calculated AVA 0.8 cm -Severe;;c) 03/2019: Mod AS (m grad 31 mmHg); d) 09/2019: Mod AS (m grad 34.5 mmHg).   Myocardial infarction (HCC) 05/2015   Type 2 MI - related to GI Bleed / Anemia (non-obstructive CAD by Cath)   Osteoporosis 03/10/2016   PEPTIC ULCER DISEASE 10/08/2006   Past Surgical History:  Procedure Laterality Date   APPENDECTOMY     BREAST BIOPSY     CARDIAC CATHETERIZATION N/A 05/21/2015   Procedure: Left Heart Cath and  Coronary Angiography;  Surgeon: [name], MD;  Location: [facility] INVASIVE CV LAB;  Service: Cardiovascular;: Ost RI 60%, Ost RCA 30%, dLAD tapers to small vessel w/ 40-50%. Mildly elevated LVEDP. Normal LV Fxn.   COLONOSCOPY N/A 05/20/2015   Procedure: COLONOSCOPY;  Surgeon: Ruffin Frederick, MD;  Location: [facility] ENDOSCOPY;  Service: Gastroenterology;  Laterality: N/A;   CORONARY CA2+ SCORE / CARDIAC CT ANGIOGRAM  10/09/2019   Calcium score 624.  82nd percentile. Dominant RCA: Mild (25-49%) proximal stenosis-distal bifurcation into PDA and PAV--< RPL branches.  LAD (1 major mid vessel diagonal) diffuse calcified plaque, mild proximal stenosis with minimal distal stenosis.  Small  RI moderate ostial disease.  LCx-moderate mixed (50-69%) proximal stenosis.  Small dOM1 disease.  Trileaflet AoV, annular Ca2+ - probable AS   ESOPHAGOGASTRODUODENOSCOPY N/A 05/20/2015   Procedure: ESOPHAGOGASTRODUODENOSCOPY (EGD);  Surgeon: Ruffin Frederick, MD;  Location: [facility] ENDOSCOPY;  Service: Gastroenterology;  Laterality: N/A;   TONSILLECTOMY     TRANSTHORACIC ECHOCARDIOGRAM  03/2019; 09/2019   a) EF 60 to 65%.  Moderate LVH.  GRII DD.  Mod-Severe AS (m grad 36 mmHg, peak 59 mmHg); b) EF 65-70%, No RWMA. Gr1 DD/hi LAP, Mild hi PAP. Mod LA Dil. MOD AS (mean Grad 34.5 mmHg).  STABLE    TRANSTHORACIC ECHOCARDIOGRAM  04/12/2017   EF 60-65%. No RWMA.  GR 1 DD.  Moderate concentric LVH.  Mild LA dilation. Severe calcified aortic valve with moderate-severe aortic stenosis (peak/mean gradients 60/34 mmHg) and in severe range by AVA (0.8 cm2).  Mild to moderately increased PA pressures 41 mmHg with normal RV size and function..     TUBAL LIGATION     Family History  Problem Relation Age of Onset   Alcohol abuse Other    Arthritis Other        DJD   Hyperlipidemia Other    Heart disease Other    Stroke Other    Hypertension Other    Depression Other    Diabetes Other    Cancer Mother        ENT cancer   CAD Sister        Several MI & PPM; long term smoker, EtOH   Heart attack Sister 85       several   Arrhythmia Sister        s/p PPM (Dr. Royann Shivers)   Social History   Socioeconomic History   Marital status: Widowed    Spouse name: Not on file   Number of children: 1   Years of education: Not on file   Highest education level: Not on file  Occupational History   Occupation: retired - Oceanographer: RETIRED  Tobacco Use   Smoking status: Never   Smokeless tobacco: Never  Substance and Sexual Activity   Alcohol use: No    Alcohol/week: 0.0 standard drinks   Drug use: No   Sexual activity: Not on file  Other Topics Concern   Not on file  Social  History Narrative   Recently widowed--husband died in 2018-12-22.      Now lives alone but her son usually stays with her at night.  During the day she has 2 nephews who live nearby to come in and check on her intermittently.  Also one of her nieces calls routinely.      When her son is home and awake, she may try to walk on a treadmill, but is scared to walk outside.  Social Determinants of Health   Financial Resource Strain: Not on file  Food Insecurity: Not on file  Transportation Needs: Not on file  Physical Activity: Not on file  Stress: Not on file  Social Connections: Not on file   Allergies  Allergen Reactions   Ibuprofen Other (See Comments)    Bleeding events    Medications  (Not in a hospital admission)    Vitals   Vitals:   08/10/20 0045 08/10/20 0048 08/10/20 0053  BP: (!) 138/103    Pulse: (!) 180    Resp: (!) 31    Temp: 97.7 F (36.5 C)    TempSrc: Temporal    SpO2: 97% 98%   Weight:   95.3 kg  Height:   5\' 1"  (1.549 m)     Body mass index is 39.68 kg/m.  Physical Exam   General: Laying comfortably in bed; in no acute distress.  HENT: Normal oropharynx and mucosa. Normal external appearance of ears and nose.  Neck: Supple, no pain or tenderness  CV: No JVD. No peripheral edema.  Pulmonary: Symmetric Chest rise. Normal respiratory effort.  Abdomen: Soft to touch, non-tender.  Ext: No cyanosis, edema, or deformity  Skin: No rash. Normal palpation of skin.   Musculoskeletal: Normal digits and nails by inspection. No clubbing.   Neurologic Examination  Mental status/Cognition: Alert, oriented to self, place, month and year, good attention.  Speech/language: Fluent, comprehension intact, object naming intact, repetition intact.  Cranial nerves:   CN II Pupils equal and reactive to light, no VF deficits    CN III,IV,VI EOM intact, no gaze preference or deviation, no nystagmus    CN V normal sensation in V1, V2, and V3 segments bilaterally     CN VII no asymmetry, no nasolabial fold flattening    CN VIII normal hearing to speech    CN IX & X normal palatal elevation, no uvular deviation    CN XI 5/5 head turn and 5/5 shoulder shrug bilaterally    CN XII midline tongue protrusion   Motor:  Muscle bulk: normal, tone increased diffusely, pronator drift none tremor none Mvmt Root Nerve  Muscle Right Left Comments  SA C5/6 Ax Deltoid 4+ 4+   EF C5/6 Mc Biceps 5 5   EE C6/7/8 Rad Triceps 5 5   WF C6/7 Med FCR     WE C7/8 PIN ECU     F Ab C8/T1 U ADM/FDI 5 5   HF L1/2/3 Fem Illopsoas 4 4   KE L2/3/4 Fem Quad 5 5   DF L4/5 D Peron Tib Ant 5 5   PF S1/2 Tibial Grc/Sol 5 5    Reflexes:  Right Left Comments  Pectoralis      Biceps (C5/6) 2+ 2+   Brachioradialis (C5/6) 2+ 2+    Triceps (C6/7) 2+ 2+    Patellar (L3/4) 3 3 Cross adductors + BL   Achilles (S1) 3 3    Hoffman + +    Plantar withdraws withdraws   Jaw jerk    Sensation:  Light touch intact   Pin prick    Temperature    Vibration   Proprioception    Coordination/Complex Motor:  - Finger to Nose intact - Heel to shin intact BL - Rapid alternating movement are slowed throughout - Gait: unsafe to assess given she is in Afibb with RVR  Labs   CBC: No results for input(s): WBC, NEUTROABS, HGB, HCT, MCV, PLT in the last 168 hours.  Basic Metabolic Panel:  Lab Results  Component Value Date   NA 140 03/18/2020   K 4.3 03/18/2020   CO2 28 03/18/2020   GLUCOSE 133 (H) 03/18/2020   BUN 17 03/18/2020   CREATININE 0.89 03/18/2020   CALCIUM 9.8 03/18/2020   GFRNONAA 49 (L) 10/01/2019   GFRAA 57 (L) 10/01/2019   Lipid Panel:  Lab Results  Component Value Date   LDLCALC 38 03/18/2020   HgbA1c:  Lab Results  Component Value Date   HGBA1C 7.2 (H) 03/18/2020   Urine Drug Screen: No results found for: LABOPIA, COCAINSCRNUR, LABBENZ, AMPHETMU, THCU, LABBARB  Alcohol Level No results found for: ETH  CT Head without contrast: CTH was negative for a large  hypodensity concerning for a large territory infarct or hyperdensity concerning for an ICH  MR Angio head without contrast and Carotid Duplex BL: pending  MRI Brain: pending  Impression   Dale Strausser is a 80 y.o. female with PMH significant for CAD, HLD, GERD, MI, DJD, aortic stenosis, who presents with difficulty speaking and several hours of LLE weakness that has resolved. Found to be in Afibb with RVR. Episode concerning for a TIA/minor ischemic strokes. Also endorsed significant hx of urianry incontinence and noted to be hyperreflexic. Her neurologic examination is notable for hyperreflexia, proximal muscle weakness in all extremities that appears consistent with deconditioning, slowed rapid alternating movements.  Primary Diagnosis:  LLE weakness concerning for minor ischemic stroke vs TIA.   Secondary Diagnosis: Paroxysmal atrial fibrillation and Type 2 diabetes mellitus w/o complications Rule out spinal cord compression given upper motor neuron signs on exam with urinary incontinence Recommendations  Plan:  Recommend that primary team order following: - Frequent Neuro checks per stroke unit protocol - Recommend brain imaging with MRI Brain without contrast - Recommend Vascular imaging with MRA Angio Head without contrast and US Carotid doppler - Recommend obtaining TTE - Recommend obtaining Lipid panel with LDL - Please start statin if LDL > 70 - Recommend HbA1c - Will need anticoagulation, timing and choice of AC after MRI Brain. Recommend Aspirin 81mg  once for now. - Recommend DVT ppx - SBP goal - permissive hypertension first 24 h < 220/110. Held home meds.  - Recommend Telemetry monitoring for arrythmia - Recommend bedside swallow screen prior to PO intake. - Stroke education booklet - Recommend PT/OT/SLP consult  ______________________________________________________________________   Thank you for the opportunity to take part in the care of this patient. If  you have any further questions, please contact the neurology consultation attending.  Signed,  [facility] Pager Number Erick Blinks _ _ _   _ __   _ __ _ _  __ __   _ __   __ _

## 2020-08-10 NOTE — Progress Notes (Signed)
ANTICOAGULATION CONSULT NOTE - Initial Consult  Pharmacy Consult for Apixaban  Indication: atrial fibrillation  Allergies  Allergen Reactions   Ibuprofen Other (See Comments)    Bleeding events    Patient Measurements: Height: 5\' 1"  (154.9 cm) Weight: 95.3 kg (210 lb) IBW/kg (Calculated) : 47.8  Vital Signs: Temp: 97.7 F (36.5 C) (07/05 1551) Temp Source: Oral (07/05 1551) BP: 169/53 (07/05 1551) Pulse Rate: 60 (07/05 1551)  Labs: Recent Labs    08/10/20 0108 08/10/20 0113 08/10/20 1618  HGB 12.2 11.9*  --   HCT 36.0 35.7*  --   PLT  --  229  --   APTT  --  29  --   LABPROT  --  14.3  --   INR  --  1.1  --   CREATININE 1.00 1.21*  --   TROPONINIHS  --   --  862*    Estimated Creatinine Clearance: 38.5 mL/min (A) (by C-G formula based on SCr of 1.21 mg/dL (H)).   Medical History: Past Medical History:  Diagnosis Date   ALLERGIC RHINITIS 06/24/2007   Anemia    AVM (arteriovenous malformation) of colon    Blood transfusion without reported diagnosis 05/2015   COLONIC POLYPS, HX OF 06/24/2007   Coronary artery disease, non-occlusive 05/2015   Trop + w/ Acute Anemia =>CATH: small RI - Ostial 60%, ostial RCA 30% and dLAD 40-50%;; 9/'21: Cor Ca Score 624. Mild (25-49%) prox RCA & LAD,; Moderate (50-69%) Ostial Small RI & prox LCx.     DEPRESSION 10/08/2006   DIABETES MELLITUS, TYPE II 10/05/2006   GERD 06/24/2007   HYPERLIPIDEMIA 10/08/2006   HYPERTENSION 10/08/2006   INSOMNIA 09/24/2008   Left knee DJD    Moderate to severe aortic stenosis 05/2015   a) Mod AS (mean gras 22 mmHg, peak 42 mmHg -AVA 1.1-1.2 cm.);b) 04/2017: Severe Ca2+ AoV w/ Mod-Severe AS (p-m grad 60-34 mmHg -Moderate), calculated AVA 0.8 cm -Severe;;c) 03/2019: Mod AS (m grad 31 mmHg); d) 09/2019: Mod AS (m grad 34.5 mmHg).   Myocardial infarction [facility]) 05/2015   Type 2 MI - related to GI Bleed / Anemia (non-obstructive CAD by Cath)   Osteoporosis 03/10/2016   PEPTIC ULCER DISEASE 10/08/2006     Assessment: 80 yr old female admitted today with new atrial fibrillation with RVR and slurred speech. Head CT was negative for acute intracranial abnormality. MRI showed severe chronic microvascular ischemic disease and remote lacunar infarcts. Pharmacy is consulted to dose apixaban for non-valvular a fib; pt was not on anticoagulant PTA.  H/H 11.9/35.7, plt 229; Scr 1.21; wt 95.3 kg  Goal of Therapy:  Prevention of stroke secondary to atrial fibrillation Monitor platelets by anticoagulation protocol: Yes   Plan:  Apixaban 5 mg PO BID, starting this evening Monitor daily CBC Monitor for bleeding  08-15-1997, PharmD, BCPS, [facility] Clinical Pharmacist 08/10/2020,6:46 PM

## 2020-08-10 NOTE — H&P (Signed)
History and Physical    Sahar Ryback JSR:[id number] DOB: 02/02/1941 DOA: 08/10/2020  PCP: Biagio Borg, MD  Patient coming from: Home   Chief Complaint:  Chief Complaint  Patient presents with   Irregular Heart Beat     HPI:    80 year old female with past medical history of iron deficiency anemia, diastolic ingestive heart failure (Echo 09/2019 EF 65-70% with G1DD), hypertension, diabetes mellitus type 2, hyperlipidemia who presents to [facility] emergency department with complaints of palpitations, chest pressure and slurred speech  Majority the history has been obtained from the patient with some portions of the history additionally obtained from her son who is at the bedside.  Patient explains that the evening of 7/3 she began to notice that she was having difficulty moving her legs, of the left leg in particular.  Her leg "would not do what I wanted to do."  The next morning, patient noticed that she felt a generalized sense of malaise with mild shortness of breath and dyspnea on exertion.  Additionally, patient was experiencing waxing waning mild chest discomfort with associated palpitations.  Patient describes chest discomfort as located in the mid anterior chest and pressure-like in quality.  Symptoms were waxing and waning throughout the day.  The afternoon of 7/4, the son noticed that the patient was exhibiting bouts of slurred and muffled speech.  It was at this point that the son decided to bring the patient into [facility] emergency department for evaluation.  Upon initial evaluation in the emergency department patient was found to be in rapid atrial fibrillation.  Due to patient's waxing and waning neurologic complaints, neurology was consulted by the emergency department provider.  By the time of the patient was evaluated by neurology the patient symptoms have essentially resolved.  Patient was placed on a diltiazem infusion to attempt rate control of the  patient's rapid atrial fibrillation.  Patient underwent a noncontrast CT scan of the head.  The hospitalist group was then called to assess the patient for admission to the hospital.    Review of Systems:   Review of Systems  Respiratory:  Positive for shortness of breath.   Cardiovascular:  Positive for chest pain and palpitations.  Neurological:  Positive for speech change and focal weakness.  All other systems reviewed and are negative.  Past Medical History:  Diagnosis Date   ALLERGIC RHINITIS 06/24/2007   Anemia    AVM (arteriovenous malformation) of colon    Blood transfusion without reported diagnosis 05/2015   COLONIC POLYPS, HX OF 06/24/2007   Coronary artery disease, non-occlusive 05/2015   Trop + w/ Acute Anemia =>CATH: small RI - Ostial 60%, ostial RCA 30% and dLAD 40-50%;; 9/'21: Cor Ca Score 624. Mild (25-49%) prox RCA & LAD,; Moderate (50-69%) Ostial Small RI & prox LCx.     DEPRESSION 10/08/2006   DIABETES MELLITUS, TYPE II 10/05/2006   GERD 06/24/2007   HYPERLIPIDEMIA 10/08/2006   HYPERTENSION 10/08/2006   INSOMNIA 09/24/2008   Left knee DJD    Moderate to severe aortic stenosis 05/2015   a) Mod AS (mean gras 22 mmHg, peak 42 mmHg -AVA 1.1-1.2 cm.);b) 04/2017: Severe Ca2+ AoV w/ Mod-Severe AS (p-m grad 60-34 mmHg -Moderate), calculated AVA 0.8 cm -Severe;;c) 03/2019: Mod AS (m grad 31 mmHg); d) 09/2019: Mod AS (m grad 34.5 mmHg).   Myocardial infarction [facility]) 05/2015   Type 2 MI - related to GI Bleed / Anemia (non-obstructive CAD by Cath)   Osteoporosis 03/10/2016  PEPTIC ULCER DISEASE 10/08/2006    Past Surgical History:  Procedure Laterality Date   APPENDECTOMY     BREAST BIOPSY     CARDIAC CATHETERIZATION N/A 05/21/2015   Procedure: Left Heart Cath and Coronary Angiography;  Surgeon: Leonie Man, MD;  Location: [place] [facility];  Service: Cardiovascular;: Ost RI 60%, Ost RCA 30%, dLAD tapers to small vessel w/ 40-50%. Mildly elevated LVEDP. Normal LV Fxn.    COLONOSCOPY N/A 05/20/2015   Procedure: COLONOSCOPY;  Surgeon: Manus Gunning, MD;  Location: [place];  Service: Gastroenterology;  Laterality: N/A;   CORONARY CA2+ SCORE / CARDIAC CT ANGIOGRAM  10/09/2019   Calcium score 624.  82nd percentile. Dominant RCA: Mild (25-49%) proximal stenosis-distal bifurcation into PDA and PAV--< RPL branches.  LAD (1 major mid vessel diagonal) diffuse calcified plaque, mild proximal stenosis with minimal distal stenosis.  Small RI moderate ostial disease.  LCx-moderate mixed (50-69%) proximal stenosis.  Small dOM1 disease.  Trileaflet AoV, annular Ca2+ - probable AS   ESOPHAGOGASTRODUODENOSCOPY N/A 05/20/2015   Procedure: ESOPHAGOGASTRODUODENOSCOPY (EGD);  Surgeon: Manus Gunning, MD;  Location: [place];  Service: Gastroenterology;  Laterality: N/A;   TONSILLECTOMY     TRANSTHORACIC ECHOCARDIOGRAM  03/2019; 09/2019   a) EF 60 to 65%.  Moderate LVH.  GRII DD.  Mod-Severe AS (m grad 36 mmHg, peak 59 mmHg); b) EF 65-70%, No RWMA. Gr1 DD/hi LAP, Mild hi PAP. Mod LA Dil. MOD AS (mean Grad 34.5 mmHg).  STABLE    TRANSTHORACIC ECHOCARDIOGRAM  04/12/2017   EF 60-65%. No RWMA.  GR 1 DD.  Moderate concentric LVH.  Mild LA dilation. Severe calcified aortic valve with moderate-severe aortic stenosis (peak/mean gradients 60/34 mmHg) and in severe range by AVA (0.8 cm2).  Mild to moderately increased PA pressures 41 mmHg with normal RV size and function..     TUBAL LIGATION       reports that she has never smoked. She has never used smokeless tobacco. She reports that she does not drink alcohol and does not use drugs.  Allergies  Allergen Reactions   Ibuprofen Other (See Comments)    Bleeding events    Family History  Problem Relation Age of Onset   Alcohol abuse Other    Arthritis Other        DJD   Hyperlipidemia Other    Heart disease Other    Stroke Other    Hypertension Other    Depression Other    Diabetes Other    Cancer Mother         ENT cancer   CAD Sister        Several MI & PPM; long term smoker, EtOH   Heart attack Sister 65       several   Arrhythmia Sister        s/p PPM (Dr. Sallyanne Kuster)     Prior to Admission medications   Medication Sig Start Date End Date Taking? Authorizing Provider  acetaminophen (TYLENOL) 500 MG tablet Take 500 mg by mouth every 6 (six) hours as needed for moderate pain.   Yes [provider]  aspirin EC 81 MG tablet Take 81 mg by mouth. Take 81 mg tablet  On Monday , Wednesday , Friday and Saturday   Yes [provider]  atorvastatin (LIPITOR) 40 MG tablet Take 1 tablet (40 mg total) by mouth daily. 08/21/19 10/16/20 Yes Leonie Man, MD  blood glucose meter kit and supplies Dispense based on patient and insurance  preference. Use up to four times daily as directed. E11.9 04/15/18  Yes Biagio Borg, MD  Blood Glucose Monitoring Suppl (ONE TOUCH ULTRA 2) w/Device KIT Use as directed daily 06/01/15  Yes Biagio Borg, MD  carvedilol (COREG) 6.25 MG tablet TAKE 1 TABLET BY MOUTH TWICE A DAY Patient taking differently: Take 6.25 mg by mouth 2 (two) times daily. 02/26/20  Yes Leonie Man, MD  chlorthalidone (HYGROTON) 25 MG tablet TAKE 1 TABLET BY MOUTH EVERY DAY Patient taking differently: Take 25 mg by mouth daily. 08/06/20  Yes Leonie Man, MD  clonazePAM (KLONOPIN) 0.5 MG tablet TAKE 1 TABLET BY MOUTH TWICE A DAY AS NEEDED FOR ANXIETY Patient taking differently: Take 0.5 mg by mouth 2 (two) times daily as needed for anxiety. TAKE 1 TABLET BY MOUTH TWICE A DAY AS NEEDED FOR ANXIETY 07/14/20  Yes Biagio Borg, MD  cyanocobalamin ([employer] VITAMIN B12) 1000 MCG tablet Take 1 tablet (1,000 mcg total) by mouth daily. 07/14/20  Yes Biagio Borg, MD  glimepiride (AMARYL) 2 MG tablet TAKE 1 TABLET BY MOUTH DAILY BEFORE BREAKFAST. Patient taking differently: Take 2 mg by mouth daily with breakfast. 04/13/20  Yes Biagio Borg, MD  Lancets [facility] DELICA PLUS [id number]) MISC USE AS  DIRECTED TEST 1-2 TIMES A DAY 07/28/19  Yes Biagio Borg, MD  metFORMIN (GLUCOPHAGE) 1000 MG tablet TAKE 1 TAB BY MOUTH IN THE AM,AND 1/2 TAB IN THE PM Patient taking differently: Take 500-1,000 mg by mouth 2 (two) times daily. TAKE 1 TAB BY MOUTH IN THE AM,AND 1/2 TAB IN THE PM 07/14/20  Yes Biagio Borg, MD  [facility] ULTRA test strip USE AS INSTRUCTED TEST 1-2 TIMES A DAY 07/28/19  Yes Biagio Borg, MD  pantoprazole (PROTONIX) 40 MG tablet Take 1 tablet (40 mg total) by mouth daily. 07/14/20  Yes Biagio Borg, MD  telmisartan (MICARDIS) 80 MG tablet TAKE 1 TABLET BY MOUTH EVERY DAY Patient taking differently: Take 80 mg by mouth daily. 02/25/20  Yes Leonie Man, MD    Physical Exam: Vitals:   08/10/20 0530 08/10/20 0545 08/10/20 0600 08/10/20 0615  BP:   (!) 142/87   Pulse: (!) 107 82 (!) 54 (!) 146  Resp: (!) 24 (!) 22 (!) 24 (!) 24  Temp:      TempSrc:      SpO2: 95% 95% 95% 95%  Weight:      Height:        Constitutional: Awake alert and oriented x3, no associated distress.   Skin: no rashes, no lesions, good skin turgor noted. Eyes: Pupils are equally reactive to light.  No evidence of scleral icterus or conjunctival pallor.  ENMT: Moist mucous membranes noted.  Posterior pharynx clear of any exudate or lesions.   Neck: normal, supple, no masses, no thyromegaly.  No evidence of jugular venous distension.   Respiratory: clear to auscultation bilaterally, no wheezing, no crackles. Normal respiratory effort. No accessory muscle use.  Cardiovascular: Tachycardic rate with irregularly irregular rhythm.  3 out of 6 systolic murmur noted.   No extremity edema. 2+ pedal pulses. No carotid bruits.  Chest:   Nontender without crepitus or deformity.   Back:   Nontender without crepitus or deformity. Abdomen: Abdomen is soft and nontender.  No evidence of intra-abdominal masses.  Positive bowel sounds noted in all quadrants.   Musculoskeletal: No joint deformity upper and lower  extremities. Good ROM, no contractures. Normal muscle tone.  Neurologic: CN 2-12 grossly intact. Sensation intact.  Patient moving all 4 extremities spontaneously.  Patient is following all commands.  Patient is responsive to verbal stimuli.   Psychiatric: Patient exhibits normal mood with appropriate affect.  Patient seems to possess insight as to their current situation.     Labs on Admission: I have personally reviewed following labs and imaging studies -   CBC: Recent Labs  Lab 08/10/20 0108 08/10/20 0113  WBC  --  7.9  NEUTROABS  --  5.4  HGB 12.2 11.9*  HCT 36.0 35.7*  MCV  --  97.5  PLT  --  660   Basic Metabolic Panel: Recent Labs  Lab 08/10/20 0108 08/10/20 0113  NA 138 136  K 3.9 3.9  CL 105 105  CO2  --  23  GLUCOSE 278* 276*  BUN 28* 28*  CREATININE 1.00 1.21*  CALCIUM  --  9.9  MG  --  1.5*   GFR: Estimated Creatinine Clearance: 38.5 mL/min (A) (by C-G formula based on SCr of 1.21 mg/dL (H)). Liver Function Tests: Recent Labs  Lab 08/10/20 0113  AST 27  ALT 17  ALKPHOS 42  BILITOT 1.2  PROT 6.4*  ALBUMIN 3.5   No results for input(s): LIPASE, AMYLASE in the last 168 hours. No results for input(s): AMMONIA in the last 168 hours. Coagulation Profile: Recent Labs  Lab 08/10/20 0113  INR 1.1   Cardiac Enzymes: No results for input(s): CKTOTAL, CKMB, CKMBINDEX, TROPONINI in the last 168 hours. BNP (last 3 results) No results for input(s): PROBNP in the last 8760 hours. HbA1C: No results for input(s): HGBA1C in the last 72 hours. CBG: Recent Labs  Lab 08/10/20 0102  GLUCAP 270*   Lipid Profile: No results for input(s): CHOL, HDL, LDLCALC, TRIG, CHOLHDL, LDLDIRECT in the last 72 hours. Thyroid Function Tests: No results for input(s): TSH, T4TOTAL, FREET4, T3FREE, THYROIDAB in the last 72 hours. Anemia Panel: No results for input(s): VITAMINB12, FOLATE, FERRITIN, TIBC, IRON, RETICCTPCT in the last 72 hours. Urine analysis:    Component  Value Date/Time   COLORURINE YELLOW 08/10/2020 0433   APPEARANCEUR CLEAR 08/10/2020 0433   LABSPEC 1.013 08/10/2020 0433   PHURINE 5.0 08/10/2020 0433   GLUCOSEU >=500 (A) 08/10/2020 0433   GLUCOSEU NEGATIVE 03/18/2020 1125   HGBUR NEGATIVE 08/10/2020 0433   BILIRUBINUR NEGATIVE 08/10/2020 0433   KETONESUR NEGATIVE 08/10/2020 0433   PROTEINUR NEGATIVE 08/10/2020 0433   UROBILINOGEN 1.0 03/18/2020 1125   NITRITE NEGATIVE 08/10/2020 0433   LEUKOCYTESUR NEGATIVE 08/10/2020 0433    Radiological Exams on Admission - Personally Reviewed: CT HEAD WO CONTRAST  Result Date: 08/10/2020 CLINICAL DATA:  TIA EXAM: CT HEAD WITHOUT CONTRAST TECHNIQUE: Contiguous axial images were obtained from the base of the skull through the vertex without intravenous contrast. COMPARISON:  None. FINDINGS: Brain: No evidence of acute infarction, hemorrhage, hydrocephalus, extra-axial collection or mass lesion/mass effect. Subcortical white matter and periventricular small vessel ischemic changes. Vascular: Intracranial atherosclerosis. Skull: Normal. Negative for fracture or focal lesion. Sinuses/Orbits: The visualized paranasal sinuses are essentially clear. The mastoid air cells are unopacified. Other: None. IMPRESSION: No evidence of acute intracranial abnormality. Small vessel ischemic changes. Electronically Signed   By: Julian Hy M.D.   On: 08/10/2020 01:54   DG Chest Port 1 View  Result Date: 08/10/2020 CLINICAL DATA:  Atrial fibrillation EXAM: PORTABLE CHEST 1 VIEW COMPARISON:  03/14/2018 FINDINGS: The heart size and mediastinal contours are within normal limits. Both lungs are clear. The  visualized skeletal structures are unremarkable. IMPRESSION: No active disease. Electronically Signed   By: Ulyses Jarred M.D.   On: 08/10/2020 01:23    EKG: Personally reviewed.  Rhythm is atrial fibrillation with heart rate of 132 bpm.  No dynamic ST segment changes appreciated.  Assessment/Plan Principal Problem:    Atrial fibrillation with rapid ventricular response [facility])  Patient presenting with new onset rapid atrial fibrillation Patient has been initiated on intravenous diltiazem infusion for rate control by the emergency department provider. Etiology is unclear at this time No hypoxia to suggest pulmonary embolism No clinical evidence of underlying infection. Magnesium found to be 1.5 which will be replaced with 3 g of intravenous magnesium sulfate Obtaining TSH, troponin, urine toxicology screen Obtaining echocardiogram in the morning Will abstain from anticoagulation until stroke is ruled out with MRI  Active Problems:   Slurred speech  Patient exhibiting waxing and waning neurologic symptoms including slurred speech and intermittent left lower extremity weakness. Possible TIA or mild stroke Neurology following, their input is appreciated Serial neurologic checks MRI brain without contrast MRA head with and without contrast Carotid ultrasound PT, OT, SLP evaluation Echocardiogram    Type 2 diabetes mellitus with hyperglycemia, without long-term current use of insulin ([place])  Patient been placed on Accu-Cheks before every meal and nightly with sliding scale insulin Holding home regimen of hypoglycemics Hemoglobin A1C ordered Diabetic Diet    Hyperlipidemia with target LDL less than 70  Continuing home regimen of lipid lowering therapy, will titrate therapy if LDL is above 70.    Essential hypertension  Permissive hypertension if stroke on MRI    Chronic diastolic CHF (congestive heart failure) (HCC)  No evidence of cardiogenic volume overload    Hypomagnesemia  Magnesium 1.5 Replacing with 3 g of intravenous magnesium sulfate Monitoring closely with serial chemistries   Code Status:  Full code Family Communication: Son is at bedside who has been updated on plan of care  Status is: Observation  The patient remains OBS appropriate and will d/c before 2  midnights.  Dispo: The patient is from: Home              Anticipated d/c is to: Home              Patient currently is not medically stable to d/c.   Difficult to place patient No        Vernelle Emerald MD [facility] Pager [phone]  If 7PM-7AM, please contact night-coverage [email or web address] Use universal [place] password for that web site. If you do not have the password, please call the hospital operator.  08/10/2020, 6:53 AM

## 2020-08-10 NOTE — Progress Notes (Signed)
*  PRELIMINARY RESULTS* Echocardiogram 2D Echocardiogram has been performed.  Neomia Dear RDCS 08/10/2020, 8:28 AM

## 2020-08-10 NOTE — Progress Notes (Addendum)
STROKE TEAM PROGRESS NOTE   SUBJECTIVE (INTERVAL HISTORY) Mary Mccarthy was laying comfortably in bed on my visit. On telemetry.  Recent imaging shows small acute infarcts in the high right posterior frontal white matter, left caudate, right thalamus, and bilateral cerebellar hemispheres and Severe aortic valve stenosis due to calcification, on TTE. LDL 53, A1C 7.4 (elevated from four months ago (7.2)  Mary Mccarthy is aware of the aortic stenosis and is followed by Dr. Herbie Baltimore in the outpatient setting. She noted that there had been talks of a loop recorder to assess for atrial fibrillation in the past but it hasn't been implemented yet. It is unclear if she has sleep apnea and is interested in participating in a study. Appropriate follow-up for this will be conducted before she leaves the hospital.   OBJECTIVE Vitals:   08/10/20 1430 08/10/20 1500 08/10/20 1512 08/10/20 1551  BP: (!) 143/60 (!) 162/56 (!) 162/56 (!) 169/53  Pulse: (!) 55 (!) 57 66 60  Resp: (!) 23 18 (!) 21   Temp:   98 F (36.7 C) 97.7 F (36.5 C)  TempSrc:   Oral Oral  SpO2: 96% 96% 96% 99%  Weight:      Height:        CBC:  Recent Labs  Lab 08/10/20 0108 08/10/20 0113  WBC  --  7.9  NEUTROABS  --  5.4  HGB 12.2 11.9*  HCT 36.0 35.7*  MCV  --  97.5  PLT  --  229    Basic Metabolic Panel:  Recent Labs  Lab 08/10/20 0108 08/10/20 0113  NA 138 136  K 3.9 3.9  CL 105 105  CO2  --  23  GLUCOSE 278* 276*  BUN 28* 28*  CREATININE 1.00 1.21*  CALCIUM  --  9.9  MG  --  1.5*    Lipid Panel: No results for input(s): CHOL, TRIG, HDL, CHOLHDL, VLDL, LDLCALC in the last 168 hours. HgbA1c:  Lab Results  Component Value Date   HGBA1C 7.4 (H) 08/10/2020   Urine Drug Screen:     Component Value Date/Time   LABOPIA NONE DETECTED 08/10/2020 0433   COCAINSCRNUR NONE DETECTED 08/10/2020 0433   LABBENZ NONE DETECTED 08/10/2020 0433   AMPHETMU NONE DETECTED 08/10/2020 0433   THCU NONE DETECTED 08/10/2020 0433    LABBARB NONE DETECTED 08/10/2020 0433    Alcohol Level     Component Value Date/Time   ETH <10 08/10/2020 0112    IMAGING  Results for orders placed or performed during the hospital encounter of 08/10/20  MR BRAIN WO CONTRAST   Narrative   CLINICAL DATA:  Transient ischemic attack.  EXAM: MRI HEAD WITHOUT CONTRAST  MRA HEAD WITHOUT CONTRAST  TECHNIQUE: Multiplanar, multi-echo pulse sequences of the brain and surrounding structures were acquired without intravenous contrast. Angiographic images of the Circle of Willis were acquired using MRA technique without intravenous contrast.  COMPARISON:  No pertinent prior exam.  FINDINGS: MRI HEAD FINDINGS  Brain: Small acute infarcts in the high right posterior frontal white matter, left caudate, right thalamus, and bilateral cerebellar hemispheres. No substantial edema or mass effect. Severe patchy fluid T2/FLAIR hyperintensities within the white matter, nonspecific but most likely related to chronic microvascular ischemic disease. Remote lacunar infarcts involving bilateral cerebellar hemispheres, right thalamus, and left corona radiata. Mild generalized atrophy with ex vacuo ventricular dilation. No hydrocephalus, acute hemorrhage, mass lesion, midline shift, or extra-axial fluid collection.  Vascular: See below.  Skull and upper cervical spine: Normal marrow  signal.  Sinuses/Orbits: Mild mucosal thickening of scattered ethmoid air cells.  Other: No mastoid effusions.  MRA HEAD FINDINGS  Anterior circulation: Bilateral ICAs, MCAs, and ACAs are patent without flow-limiting proximal stenosis. No aneurysm identified.  Posterior circulation: Bilateral visualized intradural vertebral arteries, basilar artery, and posterior cerebral arteries are patent without flow-limiting proximal stenosis. Right larger than left posterior communicating arteries. No aneurysm identified.  Anatomic variants: See  above.  IMPRESSION: MRI:  1. Small acute infarcts in the high right posterior frontal white matter, left caudate, right thalamus, and bilateral cerebellar hemispheres. No substantial edema or mass effect. Given involvement of multiple vascular territories, consider a central embolic etiology. 2. Severe chronic microvascular ischemic disease and remote lacunar infarcts.  MRA:  No large vessel occlusion or proximal flow limiting stenosis.   Electronically Signed   By: Feliberto Harts MD   On: 08/10/2020 09:57   MR ANGIO HEAD WO CONTRAST   Narrative   CLINICAL DATA:  Transient ischemic attack.  EXAM: MRI HEAD WITHOUT CONTRAST  MRA HEAD WITHOUT CONTRAST  TECHNIQUE: Multiplanar, multi-echo pulse sequences of the brain and surrounding structures were acquired without intravenous contrast. Angiographic images of the Circle of Willis were acquired using MRA technique without intravenous contrast.  COMPARISON:  No pertinent prior exam.  FINDINGS: MRI HEAD FINDINGS  Brain: Small acute infarcts in the high right posterior frontal white matter, left caudate, right thalamus, and bilateral cerebellar hemispheres. No substantial edema or mass effect. Severe patchy fluid T2/FLAIR hyperintensities within the white matter, nonspecific but most likely related to chronic microvascular ischemic disease. Remote lacunar infarcts involving bilateral cerebellar hemispheres, right thalamus, and left corona radiata. Mild generalized atrophy with ex vacuo ventricular dilation. No hydrocephalus, acute hemorrhage, mass lesion, midline shift, or extra-axial fluid collection.  Vascular: See below.  Skull and upper cervical spine: Normal marrow signal.  Sinuses/Orbits: Mild mucosal thickening of scattered ethmoid air cells.  Other: No mastoid effusions.  MRA HEAD FINDINGS  Anterior circulation: Bilateral ICAs, MCAs, and ACAs are patent without flow-limiting proximal stenosis. No  aneurysm identified.  Posterior circulation: Bilateral visualized intradural vertebral arteries, basilar artery, and posterior cerebral arteries are patent without flow-limiting proximal stenosis. Right larger than left posterior communicating arteries. No aneurysm identified.  Anatomic variants: See above.  IMPRESSION: MRI:  1. Small acute infarcts in the high right posterior frontal white matter, left caudate, right thalamus, and bilateral cerebellar hemispheres. No substantial edema or mass effect. Given involvement of multiple vascular territories, consider a central embolic etiology. 2. Severe chronic microvascular ischemic disease and remote lacunar infarcts.  MRA:  No large vessel occlusion or proximal flow limiting stenosis.   Electronically Signed   By: Feliberto Harts MD   On: 08/10/2020 09:57   CT HEAD WO CONTRAST   Narrative   CLINICAL DATA:  TIA  EXAM: CT HEAD WITHOUT CONTRAST  TECHNIQUE: Contiguous axial images were obtained from the base of the skull through the vertex without intravenous contrast.  COMPARISON:  None.  FINDINGS: Brain: No evidence of acute infarction, hemorrhage, hydrocephalus, extra-axial collection or mass lesion/mass effect.  Subcortical white matter and periventricular small vessel ischemic changes.  Vascular: Intracranial atherosclerosis.  Skull: Normal. Negative for fracture or focal lesion.  Sinuses/Orbits: The visualized paranasal sinuses are essentially clear. The mastoid air cells are unopacified.  Other: None.  IMPRESSION: No evidence of acute intracranial abnormality. Small vessel ischemic changes.   Electronically Signed   By: Charline Bills M.D.   On: 08/10/2020 01:54  PHYSICAL EXAM  General exam   HEENT-  Normocephalic, no lesions, without obvious abnormality.  Normal external eye and conjunctiva.   Cardiovascular- RRR, on telemetry  Lungs-breathing normally on room air Abdomen-  Soft Musculoskeletal-no joint deformity or swelling Skin-warm and dry, perfused throughout  Neurologic Exam: General: NAD Mental Status: Alert, oriented, thought content appropriate.  Speech fluent without evidence of aphasia.  Able to follow 3 step commands without difficulty. Cranial Nerves: II:  Visual fields grossly normal, pupils equal, round, reactive to light III,IV, VI: ptosis not present, extra-ocular motions intact bilaterally V,VII: face symmetric VIII: hearing normal bilaterally XI: head turn 5/5 XII: midline tongue extension  Motor: Right : Upper extremity   5/5    Left:     Upper extremity   5/5  Lower extremity   5/5     Lower extremity   5/5 Tone and bulk normal throughout; no atrophy noted Sensory: Light touch intact throughout, bilaterally Gait: Deferred   ASSESSMENT/PLAN Mary Mccarthy is a 80 y.o. female with a past medical history significant for CAD, HLD, MI, aortic stenosis who presented to [facility] with left leg weakness and difficulty speaking. She was found with new-onset Afib with RVR. Her symptoms have since resolved and she has gone back to baseline. Imaging on arrival showed chronic microvascular disease and small acute infarcts in the high right posterior frontal white matter, left caudate, right thalamus, and bilateral cerebellar hemispheres.  # Stroke: Small acute infarcts in the high right posterior frontal white matter, left caudate, right thalamus, and bilateral cerebellar hemispheres A shower of strokes point to an embolic etiology either from the calcification of the aortic valve or clots from atrial fibrillation. Will continue with stroke workup. Carotid Dopplers: 1-39% stenosis in the carotids bilaterally, normal flow in the vertebrals and subclavians 2D Echo: Severe aortic valve stenosis due to calcification, LVEF 55-60%, mild LA dilatation Loop recorder LDL 53, under goal of 70 HgbA1c 7.4, over goal of 7.0 and elevated from four months ago  (7.2) Continue lipitor 40mg  and .  Consider Eliquis given new onset A. fib and by cerebral embolic strokes Ongoing aggressive stroke risk factor management Sleep study to evaluate for sleep apnea Appreciate PT/OT/SLP consults  Hypertension 140s-160s/50s-90s Continue irbesartan 300mg  daily, coreg 6.25mg  BID Permissive hypertension (OK if < 220/120) but gradually normalize in 5-7 days Long-term BP goal normotensive  Hyperlipidemia LDL 53, goal < 70 Continue 40mg  lipitor daily Continue statin at discharge  Diabetes type II HgbA1c 7.4, goal < 7.0 Continue [facility]  Hospital day # 0  Bruna Potter, PhD, PA-C Stroke [phone]  I have personally obtained history,examined this patient, reviewed notes, independently viewed imaging studies, participated in medical decision making and plan of care.ROS completed by me personally and pertinent positives fully documented  I have made any additions or clarifications directly to the above note. Agree with note above.  She presented left-sided weakness and MRI shows by cerebral embolic infarcts with a EKG showing possible A. fib.  Recommend anticoagulation with Eliquis and continue ongoing stroke work-up.  Cardiology follow-up for aortic stenosis treatment and A. fib.  Discussed with Dr. Darin Engels and patient and answered questions.  Patient also appears to be at risk for sleep apnea and I discussed in brief possible participation in the sleep smart trial with the patient and after she reviewed the information she decided not to participate.  If interested could pursue outpatient evaluation for sleep apnea.  Greater than 50% time during this 35-minute visit was spent  in counseling and coordination of care and discussion with care team and answering questions.  Delia Heady, MD Medical Director [facility] Pager: [phone] 08/10/2020 6:27 PM  To contact Stroke Continuity provider, please refer to [email or web address]. After hours, contact General  Neurology

## 2020-08-10 NOTE — ED Notes (Signed)
Patient transported to MRI 

## 2020-08-10 NOTE — ED Provider Notes (Signed)
[facility] EMERGENCY DEPARTMENT Provider Note   CSN: [id number] Arrival date & time: 08/10/20  0040     History Chief Complaint  Patient presents with   Irregular Heart Beat    Mary Mccarthy is a 80 y.o. female.  HPI     THis is an 80 year old female with a history of diabetes, hypertension, hyperlipidemia, coronary artery disease who presents with an episode of aphasia, left-sided weakness, and was noted to be in atrial fibrillation with RVR by EMS.  No known history of atrial fibrillation.  She only takes aspirin daily.  Per the patient, she has not felt well for much of the day.  She woke up at 5 AM yesterday.  She describes that throughout the day she had difficulty with her left foot.  She states that "it would not do what I wanted to do."  She states that she used a walker which she does not usually use at home.  At around 10:30 PM her family noted that she was having difficulty speaking with slurred speech and left lower extremity weakness.  There was also some noted confusion.  Per the patient, the symptoms have resolved.  She no longer feels weak in her left lower extremity or foot.  She is able to provide history.  EMS noted heart rate in the 150-200s.  Patient denies chest pain or shortness of breath.  She reports generalized fatigue.  No nausea, vomiting, abdominal pain.  Past Medical History:  Diagnosis Date   ALLERGIC RHINITIS 06/24/2007   Anemia    AVM (arteriovenous malformation) of colon    Blood transfusion without reported diagnosis 05/2015   COLONIC POLYPS, HX OF 06/24/2007   Coronary artery disease, non-occlusive 05/2015   Trop + w/ Acute Anemia =>CATH: small RI - Ostial 60%, ostial RCA 30% and dLAD 40-50%;; 9/'21: Cor Ca Score 624. Mild (25-49%) prox RCA & LAD,; Moderate (50-69%) Ostial Small RI & prox LCx.     DEPRESSION 10/08/2006   DIABETES MELLITUS, TYPE II 10/05/2006   GERD 06/24/2007   HYPERLIPIDEMIA 10/08/2006   HYPERTENSION 10/08/2006    INSOMNIA 09/24/2008   Left knee DJD    Moderate to severe aortic stenosis 05/2015   a) Mod AS (mean gras 22 mmHg, peak 42 mmHg -AVA 1.1-1.2 cm.);b) 04/2017: Severe Ca2+ AoV w/ Mod-Severe AS (p-m grad 60-34 mmHg -Moderate), calculated AVA 0.8 cm -Severe;;c) 03/2019: Mod AS (m grad 31 mmHg); d) 09/2019: Mod AS (m grad 34.5 mmHg).   Myocardial infarction ([place]) 05/2015   Type 2 MI - related to GI Bleed / Anemia (non-obstructive CAD by Cath)   Osteoporosis 03/10/2016   PEPTIC ULCER DISEASE 10/08/2006    Patient Active Problem List   Diagnosis Date Noted   Aortic atherosclerosis ([place]) 03/18/2020   Chest pain at rest 09/22/2019   Vitamin D deficiency 09/21/2019   B12 deficiency 09/21/2019   COVID-19 virus vaccination not done 03/06/2019   Cough 03/14/2018   Wheezing 03/14/2018   Anxiety 09/11/2017   Moderate to severe aortic stenosis 03/14/2017   Osteoporosis 03/10/2016   Coronary artery disease, non-occlusive 05/24/2015   Pulmonary hypertension (H[facility])    Chronic diastolic heart failure ([place]) 05/20/2015   Gastric ulceration    AVM (arteriovenous malformation) of colon    Demand ischemia of myocardium ([place])    Viral illness 11/20/2014   Right knee pain 05/21/2014   Anemia, iron deficiency 05/20/2013   Encounter for well adult exam with abnormal findings 06/19/2010   MENOPAUSAL DISORDER 12/07/2009  INSOMNIA 09/24/2008   ALLERGIC RHINITIS 06/24/2007   GERD 06/24/2007   COLONIC POLYPS, HX OF 06/24/2007   Hyperlipidemia with target LDL less than 70 10/08/2006   Depression 10/08/2006   Essential hypertension 10/08/2006   PEPTIC ULCER DISEASE 10/08/2006   Diabetes ([place]) 10/05/2006   Morbid obesity ([place]) 10/05/2006    Past Surgical History:  Procedure Laterality Date   APPENDECTOMY     BREAST BIOPSY     CARDIAC CATHETERIZATION N/A 05/21/2015   Procedure: Left Heart Cath and Coronary Angiography;  Surgeon: Leonie Man, MD;  Location: [place] [facility];  Service: Cardiovascular;: Ost  RI 60%, Ost RCA 30%, dLAD tapers to small vessel w/ 40-50%. Mildly elevated LVEDP. Normal LV Fxn.   COLONOSCOPY N/A 05/20/2015   Procedure: COLONOSCOPY;  Surgeon: Manus Gunning, MD;  Location: [place];  Service: Gastroenterology;  Laterality: N/A;   CORONARY CA2+ SCORE / CARDIAC CT ANGIOGRAM  10/09/2019   Calcium score 624.  82nd percentile. Dominant RCA: Mild (25-49%) proximal stenosis-distal bifurcation into PDA and PAV--< RPL branches.  LAD (1 major mid vessel diagonal) diffuse calcified plaque, mild proximal stenosis with minimal distal stenosis.  Small RI moderate ostial disease.  LCx-moderate mixed (50-69%) proximal stenosis.  Small dOM1 disease.  Trileaflet AoV, annular Ca2+ - probable AS   ESOPHAGOGASTRODUODENOSCOPY N/A 05/20/2015   Procedure: ESOPHAGOGASTRODUODENOSCOPY (EGD);  Surgeon: Manus Gunning, MD;  Location: [place];  Service: Gastroenterology;  Laterality: N/A;   TONSILLECTOMY     TRANSTHORACIC ECHOCARDIOGRAM  03/2019; 09/2019   a) EF 60 to 65%.  Moderate LVH.  GRII DD.  Mod-Severe AS (m grad 36 mmHg, peak 59 mmHg); b) EF 65-70%, No RWMA. Gr1 DD/hi LAP, Mild hi PAP. Mod LA Dil. MOD AS (mean Grad 34.5 mmHg).  STABLE    TRANSTHORACIC ECHOCARDIOGRAM  04/12/2017   EF 60-65%. No RWMA.  GR 1 DD.  Moderate concentric LVH.  Mild LA dilation. Severe calcified aortic valve with moderate-severe aortic stenosis (peak/mean gradients 60/34 mmHg) and in severe range by AVA (0.8 cm2).  Mild to moderately increased PA pressures 41 mmHg with normal RV size and function..     TUBAL LIGATION       OB History   No obstetric history on file.     Family History  Problem Relation Age of Onset   Alcohol abuse Other    Arthritis Other        DJD   Hyperlipidemia Other    Heart disease Other    Stroke Other    Hypertension Other    Depression Other    Diabetes Other    Cancer Mother        ENT cancer   CAD Sister        Several MI & PPM; long term smoker, EtOH    Heart attack Sister 55       several   Arrhythmia Sister        s/p PPM (Dr. Sallyanne Kuster)    Social History   Tobacco Use   Smoking status: Never   Smokeless tobacco: Never  Substance Use Topics   Alcohol use: No    Alcohol/week: 0.0 standard drinks   Drug use: No    Home Medications Prior to Admission medications   Medication Sig Start Date End Date Taking? Authorizing Provider  acetaminophen (TYLENOL) 500 MG tablet Take 500 mg by mouth every 6 (six) hours as needed for moderate pain.    [provider]  aspirin EC 81 MG  tablet Take 81 mg by mouth. Take 81 mg tablet  On Monday , Wednesday , Friday and Saturday    [provider]  atorvastatin (LIPITOR) 40 MG tablet Take 1 tablet (40 mg total) by mouth daily. 08/21/19 12/25/19  Leonie Man, MD  blood glucose meter kit and supplies Dispense based on patient and insurance preference. Use up to four times daily as directed. E11.9 04/15/18   Biagio Borg, MD  Blood Glucose Monitoring Suppl (ONE TOUCH ULTRA 2) w/Device KIT Use as directed daily 06/01/15   Biagio Borg, MD  carvedilol (COREG) 6.25 MG tablet TAKE 1 TABLET BY MOUTH TWICE A DAY 02/26/20   Leonie Man, MD  chlorthalidone (HYGROTON) 25 MG tablet TAKE 1 TABLET BY MOUTH EVERY DAY 08/06/20   Leonie Man, MD  clonazePAM (KLONOPIN) 0.5 MG tablet TAKE 1 TABLET BY MOUTH TWICE A DAY AS NEEDED FOR ANXIETY 07/14/20   Biagio Borg, MD  cyanocobalamin ([employer] VITAMIN B12) 1000 MCG tablet Take 1 tablet (1,000 mcg total) by mouth daily. 07/14/20   Biagio Borg, MD  glimepiride (AMARYL) 2 MG tablet TAKE 1 TABLET BY MOUTH DAILY BEFORE BREAKFAST. 04/13/20   Biagio Borg, MD  Lancets (ONETOUCH DELICA PLUS [id number]) [place] USE AS DIRECTED TEST 1-2 TIMES A DAY 07/28/19   Biagio Borg, MD  metFORMIN (GLUCOPHAGE) 1000 MG tablet TAKE 1 TAB BY MOUTH IN THE AM,AND 1/2 TAB IN THE PM 07/14/20   Biagio Borg, MD  [facility] ULTRA test strip USE AS INSTRUCTED TEST 1-2 TIMES A DAY 07/28/19    Biagio Borg, MD  pantoprazole (PROTONIX) 40 MG tablet Take 1 tablet (40 mg total) by mouth daily. 07/14/20   Biagio Borg, MD  telmisartan (MICARDIS) 80 MG tablet TAKE 1 TABLET BY MOUTH EVERY DAY 02/25/20   Leonie Man, MD    Allergies    Ibuprofen  Review of Systems   Review of Systems  Constitutional:  Negative for fever.  Respiratory:  Negative for shortness of breath.   Cardiovascular:  Positive for palpitations. Negative for chest pain.  Gastrointestinal:  Negative for abdominal pain, nausea and vomiting.  Genitourinary:  Negative for dysuria.  Neurological:  Positive for speech difficulty and weakness. Negative for numbness.  All other systems reviewed and are negative.  Physical Exam Updated Vital Signs BP 140/80   Pulse (!) 128   Temp 97.7 F (36.5 C) (Temporal)   Resp (!) 26   Ht 1.549 m (5' 1")   Wt 95.3 kg   SpO2 97%   BMI 39.68 kg/m   Physical Exam Vitals and nursing note reviewed.  Constitutional:      Appearance: She is well-developed. She is obese. She is not ill-appearing.  HENT:     Head: Normocephalic and atraumatic.     Nose: Nose normal.     Mouth/Throat:     Mouth: Mucous membranes are moist.  Eyes:     Pupils: Pupils are equal, round, and reactive to light.  Cardiovascular:     Rate and Rhythm: Tachycardia present. Rhythm irregular.     Heart sounds: Normal heart sounds.  Pulmonary:     Effort: Pulmonary effort is normal. No respiratory distress.     Breath sounds: No wheezing.  Abdominal:     General: Bowel sounds are normal.     Palpations: Abdomen is soft.  Musculoskeletal:     Cervical back: Neck supple.     Right lower leg: Edema  present.     Left lower leg: Edema present.     Comments: Trace bilateral lower extremity edema  Skin:    General: Skin is warm and dry.  Neurological:     Mental Status: She is alert and oriented to person, place, and time.     Comments: Cranial nerves II through XII intact, 5 out of 5 strength in  all 4 extremities, no drift noted, equal reflexes bilaterally, fluent speech  Psychiatric:        Mood and Affect: Mood normal.    ED Results / Procedures / Treatments   Labs (all labs ordered are listed, but only abnormal results are displayed) Labs Reviewed  CBC - Abnormal; Notable for the following components:      Result Value   RBC 3.66 (*)    Hemoglobin 11.9 (*)    HCT 35.7 (*)    All other components within normal limits  COMPREHENSIVE METABOLIC PANEL - Abnormal; Notable for the following components:   Glucose, Bld 276 (*)    BUN 28 (*)    Creatinine, Ser 1.21 (*)    Total Protein 6.4 (*)    GFR, Estimated 45 (*)    All other components within normal limits  MAGNESIUM - Abnormal; Notable for the following components:   Magnesium 1.5 (*)    All other components within normal limits  I-STAT CHEM 8, ED - Abnormal; Notable for the following components:   BUN 28 (*)    Glucose, Bld 278 (*)    All other components within normal limits  CBG MONITORING, ED - Abnormal; Notable for the following components:   Glucose-Capillary 270 (*)    All other components within normal limits  RESP PANEL BY RT-PCR (FLU A&B, COVID) ARPGX2  ETHANOL  PROTIME-INR  APTT  DIFFERENTIAL  RAPID URINE DRUG SCREEN, HOSP PERFORMED  URINALYSIS, ROUTINE W REFLEX MICROSCOPIC    EKG EKG Interpretation  Date/Time:  Tuesday August 10 2020 00:54:56 EDT Ventricular Rate:  132 PR Interval:    QRS Duration: 96 QT Interval:  332 QTC Calculation: 491 R Axis:   13 Text Interpretation: Atrial fibrillation with rapid ventricular response with premature ventricular or aberrantly conducted complexes Minimal voltage criteria for LVH, may be normal variant ( Cornell product ) Anterior infarct , age undetermined Marked ST abnormality, possible inferolateral subendocardial injury Abnormal ECG Atrial fib new Confirmed by Thayer Jew [phone]) on 08/10/2020 2:00:43 AM  Radiology CT HEAD WO CONTRAST  Result Date:  08/10/2020 CLINICAL DATA:  TIA EXAM: CT HEAD WITHOUT CONTRAST TECHNIQUE: Contiguous axial images were obtained from the base of the skull through the vertex without intravenous contrast. COMPARISON:  None. FINDINGS: Brain: No evidence of acute infarction, hemorrhage, hydrocephalus, extra-axial collection or mass lesion/mass effect. Subcortical white matter and periventricular small vessel ischemic changes. Vascular: Intracranial atherosclerosis. Skull: Normal. Negative for fracture or focal lesion. Sinuses/Orbits: The visualized paranasal sinuses are essentially clear. The mastoid air cells are unopacified. Other: None. IMPRESSION: No evidence of acute intracranial abnormality. Small vessel ischemic changes. Electronically Signed   By: Julian Hy M.D.   On: 08/10/2020 01:54   DG Chest Port 1 View  Result Date: 08/10/2020 CLINICAL DATA:  Atrial fibrillation EXAM: PORTABLE CHEST 1 VIEW COMPARISON:  03/14/2018 FINDINGS: The heart size and mediastinal contours are within normal limits. Both lungs are clear. The visualized skeletal structures are unremarkable. IMPRESSION: No active disease. Electronically Signed   By: Ulyses Jarred M.D.   On: 08/10/2020 01:23    Procedures .  Critical Care  Date/Time: 08/10/2020 2:36 AM Performed by: Merryl Hacker, MD Authorized by: Merryl Hacker, MD   Critical care provider statement:    Critical care time (minutes):  50   Critical care was time spent personally by me on the following activities:  Discussions with consultants, evaluation of patient's response to treatment, examination of patient, ordering and performing treatments and interventions, ordering and review of laboratory studies, ordering and review of radiographic studies, pulse oximetry, re-evaluation of patient's condition, obtaining history from patient or surrogate and review of old charts   Medications Ordered in ED Medications  diltiazem (CARDIZEM) 1 mg/mL load via infusion 10 mg (10 mg  Intravenous Bolus from Bag 08/10/20 0107)    And  diltiazem (CARDIZEM) 125 mg in dextrose 5% 125 mL (1 mg/mL) infusion (10 mg/hr Intravenous Rate/Dose Change 08/10/20 0157)  magnesium sulfate IVPB 2 g 50 mL (has no administration in time range)    ED Course  I have reviewed the triage vital signs and the nursing notes.  Pertinent labs & imaging results that were available during my care of the patient were reviewed by me and considered in my medical decision making (see chart for details).  Clinical Course as of 08/10/20 0234  Tue Aug 10, 2020  0111 Spoke with neurology.  They will consult on the take patient.  Agree with work-up and plan.  Plan for admission to the hospitalist. [CH]  0112 On recheck, patient heart rate 1 79-8 15 systolic.  Labs reviewed.  She is hyperglycemic without an anion gap.  Magnesium slightly low.  This was replaced. [CH]    Clinical Course User Index [CH] Adasia Hoar, Barbette Hair, MD   MDM Rules/Calculators/A&P                          Patient presents with episode of speech difficulty and left lower extremity weakness.  Sounds like she had multiple episodes of left lower extremity weakness yesterday that waxed and waned.  She is currently neurologically intact.  She is noted to be in atrial fibrillation with RVR.  No known history.  This would certainly put her at risk for TIA or stroke.  Her neurologic exam is overall reassuring at this time.  I did speak with neurology who will assess the patient.  Patient was placed on a diltiazem drip.  Rates improved.  Magnesium slightly low.  Slight hyperglycemia without anion gap.  CT head shows no evidence of acute bleed.  EKG shows some diffuse ST segment changes likely related to right.  She is not having any active chest pain or shortness of breath.  Plan for admission to the hospitalist for further management.  Will likely need cardiology consultation. Final Clinical Impression(s) / ED Diagnoses Final diagnoses:  Atrial  fibrillation with RVR (HCC)  TIA (transient ischemic attack)    Rx / DC Orders ED Discharge Orders          Ordered    Amb referral to [facility]IB Clinic        08/10/20 0059             Merryl Hacker, MD 08/10/20 0236

## 2020-08-10 NOTE — Progress Notes (Signed)
Carotid duplex bilateral attempted. Patient not in room. Will attempt again as schedule and patient availability permits.   Jean Rosenthal, RDMS, RVT

## 2020-08-10 NOTE — ED Notes (Signed)
Echo at bedside

## 2020-08-10 NOTE — Progress Notes (Signed)
[facility] PROGRESS NOTE    Progress Note  Mary Mccarthy  ZOX:[id number] DOB: Jan 23, 1941 DOA: 08/10/2020 PCP: Corwin Levins, MD     Brief Narrative:   Mary Mccarthy is an 80 y.o. female past medical history of iron deficiency anemia chronic diastolic heart failure, diabetes mellitus type 2 comes into the emergency room for palpitations chest pressure and slurred speech upon evaluation in the ED he was found to be in A. fib with RVR due to patient waxing and waning neurological symptoms neurology was consulted as an drip CT of the head showed no acute intracranial abnormalities.  MRI of the brain showed small acute infarct diffusion bilaterally    Assessment/Plan:   Atrial fibrillation with rapid ventricular response [facility]): Patient on diltiazem drip heart rate improved. Mag was 1.5 she was given mag IV. 2D echo is pending.  Now off tilt in sinus bradycardia.  Acute CVA: Likely due to A. Fib HgbA1c in February 2022 7.2, fasting lipid panel ending MRI, MRA of the brain without contrast multiple CVAs see below for further details PT, OT, Speech consult pending Transthoracic Echo pending To evaluate will probably need to be on a DOAC Atorvastatin 40  BP goal: permissive HTN upto 220/120 mmHg Telemetry monitoring   Type 2 diabetes mellitus with hyperglycemia, without long-term current use of insulin (HCC) Continue to hold oral hypoglycemic agents. She is currently n.p.o. continue sliding scale insulin CBGs before meals and at bedtime.    Hyperlipidemia with target LDL less than 70 Currently on a statin.  Essential hypertension See above allow permissive hypertension.  Chronic diastolic CHF (congestive heart failure) (HCC)  Hypomagnesemia Repleted IV recheck in the morning.    DVT prophylaxis: lovenox Family Communication:none Status is: Observation  The patient will require care spanning > 2 midnights and should be moved to inpatient because: Hemodynamically  unstable  Dispo: The patient is from: Home              Anticipated d/c is to: SNF              Patient currently is not medically stable to d/c.   Difficult to place patient No        Code Status:     Code Status Orders  (From admission, onward)           Start     Ordered   08/10/20 0650  Full code  Continuous        08/10/20 0651           Code Status History     Date Active Date Inactive Code Status Order ID Comments User Context   05/18/2015 0020 05/23/2015 1903 Full Code [id number]  [name], MD ED         IV Access:   Peripheral IV   Procedures and diagnostic studies:   CT HEAD WO CONTRAST  Result Date: 08/10/2020 CLINICAL DATA:  TIA EXAM: CT HEAD WITHOUT CONTRAST TECHNIQUE: Contiguous axial images were obtained from the base of the skull through the vertex without intravenous contrast. COMPARISON:  None. FINDINGS: Brain: No evidence of acute infarction, hemorrhage, hydrocephalus, extra-axial collection or mass lesion/mass effect. Subcortical white matter and periventricular small vessel ischemic changes. Vascular: Intracranial atherosclerosis. Skull: Normal. Negative for fracture or focal lesion. Sinuses/Orbits: The visualized paranasal sinuses are essentially clear. The mastoid air cells are unopacified. Other: None. IMPRESSION: No evidence of acute intracranial abnormality. Small vessel ischemic changes. Electronically Signed   By: Roselie Awkward.D.  On: 08/10/2020 01:54   MR ANGIO HEAD WO CONTRAST  Result Date: 08/10/2020 CLINICAL DATA:  Transient ischemic attack. EXAM: MRI HEAD WITHOUT CONTRAST MRA HEAD WITHOUT CONTRAST TECHNIQUE: Multiplanar, multi-echo pulse sequences of the brain and surrounding structures were acquired without intravenous contrast. Angiographic images of the Circle of Willis were acquired using MRA technique without intravenous contrast. COMPARISON:  No pertinent prior exam. FINDINGS: MRI HEAD FINDINGS Brain: Small acute  infarcts in the high right posterior frontal white matter, left caudate, right thalamus, and bilateral cerebellar hemispheres. No substantial edema or mass effect. Severe patchy fluid T2/FLAIR hyperintensities within the white matter, nonspecific but most likely related to chronic microvascular ischemic disease. Remote lacunar infarcts involving bilateral cerebellar hemispheres, right thalamus, and left corona radiata. Mild generalized atrophy with ex vacuo ventricular dilation. No hydrocephalus, acute hemorrhage, mass lesion, midline shift, or extra-axial fluid collection. Vascular: See below. Skull and upper cervical spine: Normal marrow signal. Sinuses/Orbits: Mild mucosal thickening of scattered ethmoid air cells. Other: No mastoid effusions. MRA HEAD FINDINGS Anterior circulation: Bilateral ICAs, MCAs, and ACAs are patent without flow-limiting proximal stenosis. No aneurysm identified. Posterior circulation: Bilateral visualized intradural vertebral arteries, basilar artery, and posterior cerebral arteries are patent without flow-limiting proximal stenosis. Right larger than left posterior communicating arteries. No aneurysm identified. Anatomic variants: See above. IMPRESSION: MRI: 1. Small acute infarcts in the high right posterior frontal white matter, left caudate, right thalamus, and bilateral cerebellar hemispheres. No substantial edema or mass effect. Given involvement of multiple vascular territories, consider a central embolic etiology. 2. Severe chronic microvascular ischemic disease and remote lacunar infarcts. MRA: No large vessel occlusion or proximal flow limiting stenosis. Electronically Signed   By: Feliberto Harts MD   On: 08/10/2020 09:57   MR BRAIN WO CONTRAST  Result Date: 08/10/2020 CLINICAL DATA:  Transient ischemic attack. EXAM: MRI HEAD WITHOUT CONTRAST MRA HEAD WITHOUT CONTRAST TECHNIQUE: Multiplanar, multi-echo pulse sequences of the brain and surrounding structures were acquired  without intravenous contrast. Angiographic images of the Circle of Willis were acquired using MRA technique without intravenous contrast. COMPARISON:  No pertinent prior exam. FINDINGS: MRI HEAD FINDINGS Brain: Small acute infarcts in the high right posterior frontal white matter, left caudate, right thalamus, and bilateral cerebellar hemispheres. No substantial edema or mass effect. Severe patchy fluid T2/FLAIR hyperintensities within the white matter, nonspecific but most likely related to chronic microvascular ischemic disease. Remote lacunar infarcts involving bilateral cerebellar hemispheres, right thalamus, and left corona radiata. Mild generalized atrophy with ex vacuo ventricular dilation. No hydrocephalus, acute hemorrhage, mass lesion, midline shift, or extra-axial fluid collection. Vascular: See below. Skull and upper cervical spine: Normal marrow signal. Sinuses/Orbits: Mild mucosal thickening of scattered ethmoid air cells. Other: No mastoid effusions. MRA HEAD FINDINGS Anterior circulation: Bilateral ICAs, MCAs, and ACAs are patent without flow-limiting proximal stenosis. No aneurysm identified. Posterior circulation: Bilateral visualized intradural vertebral arteries, basilar artery, and posterior cerebral arteries are patent without flow-limiting proximal stenosis. Right larger than left posterior communicating arteries. No aneurysm identified. Anatomic variants: See above. IMPRESSION: MRI: 1. Small acute infarcts in the high right posterior frontal white matter, left caudate, right thalamus, and bilateral cerebellar hemispheres. No substantial edema or mass effect. Given involvement of multiple vascular territories, consider a central embolic etiology. 2. Severe chronic microvascular ischemic disease and remote lacunar infarcts. MRA: No large vessel occlusion or proximal flow limiting stenosis. Electronically Signed   By: Feliberto Harts MD   On: 08/10/2020 09:57   DG Chest Port 1 [address]  Result  Date: 08/10/2020 CLINICAL DATA:  Atrial fibrillation EXAM: PORTABLE CHEST 1 VIEW COMPARISON:  03/14/2018 FINDINGS: The heart size and mediastinal contours are within normal limits. Both lungs are clear. The visualized skeletal structures are unremarkable. IMPRESSION: No active disease. Electronically Signed   By: Deatra Robinson M.D.   On: 08/10/2020 01:23   ECHOCARDIOGRAM COMPLETE  Result Date: 08/10/2020    ECHOCARDIOGRAM REPORT   Patient Name:   Mary Mccarthy Date of Exam: 08/10/2020 Medical Rec #:  [id number]      Height:       61.0 in Accession #:    [id number]     Weight:       210.0 lb Date of Birth:  Mar 13, 1940      BSA:          1.928 m Patient Age:    79 years       BP:           156/56 mmHg Patient Gender: F              HR:           49 bpm. Exam Location:  Inpatient Procedure: 2D Echo, Cardiac Doppler and Color Doppler                     STAT ECHO Reported to: Chandrasekhar on 08/10/2020 8:38:00 AM. Indications:    TIA  History:        Patient has prior history of Echocardiogram examinations, most                 recent 10/01/2019. CHF, CAD and Previous Myocardial Infarction,                 Arrythmias:Atrial Fibrillation, Signs/Symptoms:Murmur; Risk                 Factors:Dyslipidemia, Hypertension and Diabetes. 05/21/2015 cath.  Sonographer:    Neomia Dear RDCS Referring Phys: [id number] Marinda Elk  Sonographer Comments: Patient is morbidly obese. Image acquisition challenging due to patient body habitus. IMPRESSIONS  1. The aortic valve is calcified. Aortic valve regurgitation is not visualized. Severe aortic valve stenosis. Aortic valve area, by VTI measures 0.86 cm. Aortic valve mean gradient measures 42.0 mmHg. Aortic valve Vmax measures 4.36 m/s.  2. Left ventricular ejection fraction, by estimation, is 55 to 60%. The left ventricle has normal function. The left ventricle has no regional wall motion abnormalities. There is severe concentric left ventricular hypertrophy. Left ventricular  diastolic  parameters are indeterminate.  3. Right ventricular systolic function is normal. The right ventricular size is normal. There is moderately elevated pulmonary artery systolic pressure.  4. Left atrial size was mildly dilated.  5. The pericardial effusion is circumferential.  6. The mitral valve is abnormal. Trivial mitral valve regurgitation. The mean mitral valve gradient is 2.0 mmHg with average heart rate of 53 bpm. Severe mitral annular calcification. Comparison(s): A prior study was performed on 10/01/2019. Prior images reviewed side by side. Further increase in aortic valve gradients. Conclusion(s)/Recommendation(s): If urgent to emergent non-cardiac surgery is considered, care to manage hypotension should be considered in the setting of severe aortic stenosis. FINDINGS  Left Ventricle: NO LVOTO. Left ventricular ejection fraction, by estimation, is 55 to 60%. The left ventricle has normal function. The left ventricle has no regional wall motion abnormalities. The left ventricular internal cavity size was normal in size. There is severe concentric left ventricular hypertrophy. Left ventricular diastolic parameters are indeterminate. Right  Ventricle: The right ventricular size is normal. No increase in right ventricular wall thickness. Right ventricular systolic function is normal. There is moderately elevated pulmonary artery systolic pressure. The tricuspid regurgitant velocity is 3.72 m/s, and with an assumed right atrial pressure of 3 mmHg, the estimated right ventricular systolic pressure is 58.4 mmHg. Left Atrium: Left atrial size was mildly dilated. Right Atrium: Right atrial size was normal in size. Pericardium: Trivial pericardial effusion is present. The pericardial effusion is circumferential. Mitral Valve: The mitral valve is abnormal. Severe mitral annular calcification. Trivial mitral valve regurgitation. MV peak gradient, 7.0 mmHg. The mean mitral valve gradient is 2.0 mmHg with average  heart rate of 53 bpm. Tricuspid Valve: The tricuspid valve is normal in structure. Tricuspid valve regurgitation is mild . No evidence of tricuspid stenosis. Aortic Valve: The aortic valve is calcified. Aortic valve regurgitation is not visualized. Severe aortic stenosis is present. Aortic valve mean gradient measures 42.0 mmHg. Aortic valve peak gradient measures 76.0 mmHg. Aortic valve area, by VTI measures  0.86 cm. Pulmonic Valve: The pulmonic valve was grossly normal. Pulmonic valve regurgitation is not visualized. No evidence of pulmonic stenosis. Aorta: The aortic root is normal in size and structure. IAS/Shunts: The atrial septum is grossly normal.  LEFT VENTRICLE PLAX 2D LVIDd:         4.00 cm     Diastology LVIDs:         2.80 cm     LV e' medial:    6.20 cm/s LV PW:         1.70 cm     LV E/e' medial:  19.7 LV IVS:        1.50 cm     LV e' lateral:   5.33 cm/s LVOT diam:     2.00 cm     LV E/e' lateral: 22.9 LV SV:         81 LV SV Index:   42 LVOT Area:     3.14 cm  LV Volumes (MOD) LV vol d, MOD A4C: 63.5 ml LV vol s, MOD A4C: 30.4 ml LV SV MOD A4C:     63.5 ml RIGHT VENTRICLE RV S prime:     15.40 cm/s TAPSE (M-mode): 2.9 cm LEFT ATRIUM             Index       RIGHT ATRIUM           Index LA diam:        5.00 cm 2.59 cm/m  RA Area:     20.50 cm LA Vol (A2C):   59.5 ml 30.86 ml/m RA Volume:   62.40 ml  32.36 ml/m LA Vol (A4C):   76.6 ml 39.72 ml/m LA Biplane Vol: 69.1 ml 35.83 ml/m  AORTIC VALVE                    PULMONIC VALVE AV Area (Vmax):    0.73 cm     PV Vmax:       1.62 m/s AV Area (Vmean):   0.75 cm     PV Vmean:      135.000 cm/s AV Area (VTI):     0.86 cm     PV VTI:        0.496 m AV Vmax:           436.00 cm/s  PV Peak grad:  10.5 mmHg AV Vmean:          273.750 cm/s  PV Mean grad:  9.0 mmHg AV VTI:            0.950 m AV Peak Grad:      76.0 mmHg AV Mean Grad:      42.0 mmHg LVOT Vmax:         102.00 cm/s LVOT Vmean:        65.400 cm/s LVOT VTI:          0.259 m LVOT/AV VTI  ratio: 0.27  AORTA Ao Root diam: 2.70 cm Ao Asc diam:  2.80 cm MITRAL VALVE                 TRICUSPID VALVE MV Area (PHT): 3.48 cm      TR Peak grad:   55.4 mmHg MV Area VTI:   1.76 cm      TR Vmax:        372.00 cm/s MV Peak grad:  7.0 mmHg MV Mean grad:  2.0 mmHg      SHUNTS MV Vmax:       1.32 m/s      Systemic VTI:  0.26 m MV Vmean:      67.9 cm/s     Systemic Diam: 2.00 cm MV Decel Time: 218 msec MR Peak grad:    63.0 mmHg MR Mean grad:    47.0 mmHg MR Vmax:         397.00 cm/s MR Vmean:        333.0 cm/s MR PISA:         0.57 cm MR PISA Eff ROA: 3 mm MR PISA Radius:  0.30 cm MV E velocity: 122.00 cm/s MV A velocity: 74.60 cm/s MV E/A ratio:  1.64 Riley Lam MD Electronically signed by Riley Lam MD Signature Date/Time: 08/10/2020/8:56:46 AM    Final    VAS US CAROTID (at [facility] and [facility] only)  Result Date: 08/10/2020 Carotid Arterial Duplex Study Patient Name:  Mary Mccarthy  Date of Exam:   08/10/2020 Medical Rec #: [id number]       Accession #:    [id number] Date of Birth: 05-Jun-1940       Patient Gender: F Patient Age:   79Y Exam Location:  [facility] Procedure:      VAS US CAROTID Referring Phys: [id number] Deno Lunger [facility] --------------------------------------------------------------------------------  Indications:       CVA. Risk Factors:      Hypertension, hyperlipidemia, Diabetes. Comparison Study:  MRA head 08-10-2020, no prior carotid duplex studies. Performing Technologist: Jean Rosenthal RDMS,RVT  Examination Guidelines: A complete evaluation includes B-mode imaging, spectral Doppler, color Doppler, and power Doppler as needed of all accessible portions of each vessel. Bilateral testing is considered an integral part of a complete examination. Limited examinations for reoccurring indications may be performed as noted.  Right Carotid Findings: +----------+--------+--------+--------+--------------------------+--------+           PSV cm/sEDV cm/sStenosisPlaque Description         Comments +----------+--------+--------+--------+--------------------------+--------+ CCA Prox  79      9                                                  +----------+--------+--------+--------+--------------------------+--------+ CCA Distal72      14                                                 +----------+--------+--------+--------+--------------------------+--------+  ICA Prox  63      15      1-39%   mild heterogenous                  +----------+--------+--------+--------+--------------------------+--------+ ICA Distal65      16                                        tortuous +----------+--------+--------+--------+--------------------------+--------+ ECA       57                      calcific, smooth and focal         +----------+--------+--------+--------+--------------------------+--------+ +----------+--------+-------+----------------+-------------------+           PSV cm/sEDV cmsDescribe        Arm Pressure (mmHG) +----------+--------+-------+----------------+-------------------+ [id number]             Multiphasic, WNL                    +----------+--------+-------+----------------+-------------------+ +---------+--------+--+--------+--+---------+ VertebralPSV cm/s44EDV cm/s10Antegrade +---------+--------+--+--------+--+---------+  Left Carotid Findings: +----------+--------+--------+--------+------------------+--------+           PSV cm/sEDV cm/sStenosisPlaque DescriptionComments +----------+--------+--------+--------+------------------+--------+ CCA Prox  67      11                                         +----------+--------+--------+--------+------------------+--------+ CCA Distal65      18                                         +----------+--------+--------+--------+------------------+--------+ ICA Prox  70      15      1-39%   calcific                    +----------+--------+--------+--------+------------------+--------+ ICA Distal97      24                                         +----------+--------+--------+--------+------------------+--------+ ECA       86                                                 +----------+--------+--------+--------+------------------+--------+ +----------+--------+--------+----------------+-------------------+           PSV cm/sEDV cm/sDescribe        Arm Pressure (mmHG) +----------+--------+--------+----------------+-------------------+ [id number]             Multiphasic, WNL                    +----------+--------+--------+----------------+-------------------+ +---------+--------+--+--------+-+---------+ VertebralPSV cm/s39EDV cm/s9Antegrade +---------+--------+--+--------+-+---------+   Summary: Right Carotid: Velocities in the right ICA are consistent with a 1-39% stenosis. Left Carotid: Velocities in the left ICA are consistent with a 1-39% stenosis. Vertebrals:  Bilateral vertebral arteries demonstrate antegrade flow. Subclavians: Normal flow hemodynamics were seen in bilateral subclavian              arteries. *See table(s) above for measurements and observations.     Preliminary  Medical Consultants:   None.   Subjective:    Mary Mccarthy no compalins  Objective:    Vitals:   08/10/20 0615 08/10/20 0805 08/10/20 0830 08/10/20 1030  BP:  (!) 163/93 (!) 156/132 (!) 157/68  Pulse: (!) 146 (!) 51 (!) 54 61  Resp: (!) 24 19 18  (!) 24  Temp:  97.8 F (36.6 C)    TempSrc:  Temporal    SpO2: 95% 98% 96% 99%  Weight:      Height:       SpO2: 99 %   Intake/Output Summary (Last 24 hours) at 08/10/2020 1157 Last data filed at 08/10/2020 1155 Gross per 24 hour  Intake 150 ml  Output 420 ml  Net -270 ml   Filed Weights   08/10/20 0053  Weight: 95.3 kg    Exam: General exam: In no acute distress. Respiratory system: Good air movement and clear to  auscultation. Cardiovascular system: S1 & S2 heard, RRR. No JVD,. Gastrointestinal system: Abdomen is nondistended, soft and nontender.  Extremities: No pedal edema. Skin: No rashes, lesions or ulcers Data Reviewed:    Labs: Basic Metabolic Panel: Recent Labs  Lab 08/10/20 0108 08/10/20 0113  NA 138 136  K 3.9 3.9  CL 105 105  CO2  --  23  GLUCOSE 278* 276*  BUN 28* 28*  CREATININE 1.00 1.21*  CALCIUM  --  9.9  MG  --  1.5*   GFR Estimated Creatinine Clearance: 38.5 mL/min (A) (by C-G formula based on SCr of 1.21 mg/dL (H)). Liver Function Tests: Recent Labs  Lab 08/10/20 0113  AST 27  ALT 17  ALKPHOS 42  BILITOT 1.2  PROT 6.4*  ALBUMIN 3.5   No results for input(s): LIPASE, AMYLASE in the last 168 hours. No results for input(s): AMMONIA in the last 168 hours. Coagulation profile Recent Labs  Lab 08/10/20 0113  INR 1.1   COVID-19 Labs  No results for input(s): DDIMER, FERRITIN, LDH, CRP in the last 72 hours.  Lab Results  Component Value Date   SARSCOV2NAA NEGATIVE 08/10/2020    CBC: Recent Labs  Lab 08/10/20 0108 08/10/20 0113  WBC  --  7.9  NEUTROABS  --  5.4  HGB 12.2 11.9*  HCT 36.0 35.7*  MCV  --  97.5  PLT  --  229   Cardiac Enzymes: No results for input(s): CKTOTAL, CKMB, CKMBINDEX, TROPONINI in the last 168 hours. BNP (last 3 results) No results for input(s): PROBNP in the last 8760 hours. CBG: Recent Labs  Lab 08/10/20 0102 08/10/20 0740  GLUCAP 270* 159*   D-Dimer: No results for input(s): DDIMER in the last 72 hours. Hgb A1c: No results for input(s): HGBA1C in the last 72 hours. Lipid Profile: No results for input(s): CHOL, HDL, LDLCALC, TRIG, CHOLHDL, LDLDIRECT in the last 72 hours. Thyroid function studies: No results for input(s): TSH, T4TOTAL, T3FREE, THYROIDAB in the last 72 hours.  Invalid input(s): FREET3 Anemia work up: No results for input(s): VITAMINB12, FOLATE, FERRITIN, TIBC, IRON, RETICCTPCT in the last 72  hours. Sepsis Labs: Recent Labs  Lab 08/10/20 0113  WBC 7.9   Microbiology Recent Results (from the past 240 hour(s))  Resp Panel by RT-PCR (Flu A&B, Covid) Nasopharyngeal Swab     Status: None   Collection Time: 08/10/20  1:12 AM   Specimen: Nasopharyngeal Swab; Nasopharyngeal(NP) swabs in vial transport medium  Result Value Ref Range Status   SARS Coronavirus 2 by RT PCR NEGATIVE NEGATIVE Final  Comment: (NOTE) SARS-CoV-2 target nucleic acids are NOT DETECTED.  The SARS-CoV-2 RNA is generally detectable in upper respiratory specimens during the acute phase of infection. The lowest concentration of SARS-CoV-2 viral copies this assay can detect is 138 copies/mL. A negative result does not preclude SARS-Cov-2 infection and should not be used as the sole basis for treatment or other patient management decisions. A negative result may occur with  improper specimen collection/handling, submission of specimen other than nasopharyngeal swab, presence of viral mutation(s) within the areas targeted by this assay, and inadequate number of viral copies(<138 copies/mL). A negative result must be combined with clinical observations, patient history, and epidemiological information. The expected result is Negative.  Fact Sheet for Patients:  BloggerCourse.com  Fact Sheet for Healthcare Providers:  SeriousBroker.it  This test is no t yet approved or cleared by the [place] FDA and  has been authorized for detection and/or diagnosis of SARS-CoV-2 by FDA under an Emergency Use Authorization (EUA). This EUA will remain  in effect (meaning this test can be used) for the duration of the COVID-19 declaration under Section 564(b)(1) of the Act, 21 U.S.C.section 360bbb-3(b)(1), unless the authorization is terminated  or revoked sooner.       Influenza A by PCR NEGATIVE NEGATIVE Final   Influenza B by PCR NEGATIVE NEGATIVE Final     Comment: (NOTE) The Xpert Xpress SARS-CoV-2/FLU/RSV plus assay is intended as an aid in the diagnosis of influenza from Nasopharyngeal swab specimens and should not be used as a sole basis for treatment. Nasal washings and aspirates are unacceptable for Xpert Xpress SARS-CoV-2/FLU/RSV testing.  Fact Sheet for Patients: BloggerCourse.com  Fact Sheet for Healthcare Providers: SeriousBroker.it  This test is not yet approved or cleared by the [place] FDA and has been authorized for detection and/or diagnosis of SARS-CoV-2 by FDA under an Emergency Use Authorization (EUA). This EUA will remain in effect (meaning this test can be used) for the duration of the COVID-19 declaration under Section 564(b)(1) of the Act, 21 U.S.C. section 360bbb-3(b)(1), unless the authorization is terminated or revoked.  Performed at [facility], [address], [place], [place] [address]      Medications:     stroke: mapping our early stages of recovery book   Does not apply Once   aspirin EC  81 mg Oral Daily   atorvastatin  40 mg Oral Daily   carvedilol  6.25 mg Oral BID   insulin aspart  0-15 Units Subcutaneous TID AC & HS   irbesartan  300 mg Oral Daily   pantoprazole  40 mg Oral Daily   Continuous Infusions:    LOS: 0 days   Marinda Elk  [facility]  08/10/2020, 11:57 AM

## 2020-08-10 NOTE — ED Notes (Signed)
Paged neurosurgery  

## 2020-08-10 NOTE — ED Triage Notes (Signed)
Family called 911 for slurred speech and LLE weakness that began at 2230. Pt seemed confused according to family; pt was GCS 15 when EMS arrived and was noted to have a HR of 150-200's and irregular. Cbg 300 PTA. No hx of afib per pt.

## 2020-08-11 DIAGNOSIS — Z7982 Long term (current) use of aspirin: Secondary | ICD-10-CM | POA: Diagnosis not present

## 2020-08-11 DIAGNOSIS — I110 Hypertensive heart disease with heart failure: Secondary | ICD-10-CM | POA: Diagnosis present

## 2020-08-11 DIAGNOSIS — I480 Paroxysmal atrial fibrillation: Secondary | ICD-10-CM | POA: Diagnosis present

## 2020-08-11 DIAGNOSIS — G8314 Monoplegia of lower limb affecting left nondominant side: Secondary | ICD-10-CM | POA: Diagnosis present

## 2020-08-11 DIAGNOSIS — E669 Obesity, unspecified: Secondary | ICD-10-CM | POA: Diagnosis present

## 2020-08-11 DIAGNOSIS — R32 Unspecified urinary incontinence: Secondary | ICD-10-CM | POA: Diagnosis present

## 2020-08-11 DIAGNOSIS — E1165 Type 2 diabetes mellitus with hyperglycemia: Secondary | ICD-10-CM | POA: Diagnosis present

## 2020-08-11 DIAGNOSIS — Z79899 Other long term (current) drug therapy: Secondary | ICD-10-CM | POA: Diagnosis not present

## 2020-08-11 DIAGNOSIS — E785 Hyperlipidemia, unspecified: Secondary | ICD-10-CM | POA: Diagnosis present

## 2020-08-11 DIAGNOSIS — I4811 Longstanding persistent atrial fibrillation: Secondary | ICD-10-CM | POA: Diagnosis present

## 2020-08-11 DIAGNOSIS — I4891 Unspecified atrial fibrillation: Secondary | ICD-10-CM | POA: Diagnosis not present

## 2020-08-11 DIAGNOSIS — I358 Other nonrheumatic aortic valve disorders: Secondary | ICD-10-CM | POA: Diagnosis present

## 2020-08-11 DIAGNOSIS — I2510 Atherosclerotic heart disease of native coronary artery without angina pectoris: Secondary | ICD-10-CM | POA: Diagnosis present

## 2020-08-11 DIAGNOSIS — I10 Essential (primary) hypertension: Secondary | ICD-10-CM | POA: Diagnosis not present

## 2020-08-11 DIAGNOSIS — R4701 Aphasia: Secondary | ICD-10-CM | POA: Diagnosis present

## 2020-08-11 DIAGNOSIS — I6523 Occlusion and stenosis of bilateral carotid arteries: Secondary | ICD-10-CM | POA: Diagnosis present

## 2020-08-11 DIAGNOSIS — M1712 Unilateral primary osteoarthritis, left knee: Secondary | ICD-10-CM | POA: Diagnosis present

## 2020-08-11 DIAGNOSIS — I252 Old myocardial infarction: Secondary | ICD-10-CM | POA: Diagnosis not present

## 2020-08-11 DIAGNOSIS — I5032 Chronic diastolic (congestive) heart failure: Secondary | ICD-10-CM | POA: Diagnosis present

## 2020-08-11 DIAGNOSIS — Z823 Family history of stroke: Secondary | ICD-10-CM | POA: Diagnosis not present

## 2020-08-11 DIAGNOSIS — G459 Transient cerebral ischemic attack, unspecified: Secondary | ICD-10-CM | POA: Diagnosis present

## 2020-08-11 DIAGNOSIS — R29700 NIHSS score 0: Secondary | ICD-10-CM | POA: Diagnosis present

## 2020-08-11 DIAGNOSIS — Z8673 Personal history of transient ischemic attack (TIA), and cerebral infarction without residual deficits: Secondary | ICD-10-CM | POA: Diagnosis not present

## 2020-08-11 DIAGNOSIS — Z6839 Body mass index (BMI) 39.0-39.9, adult: Secondary | ICD-10-CM | POA: Diagnosis not present

## 2020-08-11 DIAGNOSIS — Z20822 Contact with and (suspected) exposure to covid-19: Secondary | ICD-10-CM | POA: Diagnosis present

## 2020-08-11 DIAGNOSIS — I63443 Cerebral infarction due to embolism of bilateral cerebellar arteries: Secondary | ICD-10-CM | POA: Diagnosis present

## 2020-08-11 DIAGNOSIS — Z818 Family history of other mental and behavioral disorders: Secondary | ICD-10-CM | POA: Diagnosis not present

## 2020-08-11 DIAGNOSIS — I071 Rheumatic tricuspid insufficiency: Secondary | ICD-10-CM | POA: Diagnosis present

## 2020-08-11 HISTORY — DX: Paroxysmal atrial fibrillation: I48.0

## 2020-08-11 LAB — LIPID PANEL
Cholesterol: 97 mg/dL (ref 0–200)
HDL: 32 mg/dL — ABNORMAL LOW (ref >40)
LDL Cholesterol: 48 mg/dL (ref 0–99)
Total CHOL/HDL Ratio: 3 RATIO
Triglycerides: 86 mg/dL (ref <150)
VLDL: 17 mg/dL (ref 0–40)

## 2020-08-11 LAB — GLUCOSE, CAPILLARY
Glucose-Capillary: 132 mg/dL — ABNORMAL HIGH (ref 70–99)
Glucose-Capillary: 166 mg/dL — ABNORMAL HIGH (ref 70–99)
Glucose-Capillary: 152 mg/dL — ABNORMAL HIGH (ref 70–99)
Glucose-Capillary: 133 mg/dL — ABNORMAL HIGH (ref 70–99)

## 2020-08-11 LAB — COMPREHENSIVE METABOLIC PANEL
ALT: 18 U/L (ref 0–44)
AST: 26 U/L (ref 15–41)
Albumin: 3.5 g/dL (ref 3.5–5.0)
Alkaline Phosphatase: 41 U/L (ref 38–126)
Anion gap: 7 (ref 5–15)
BUN: 17 mg/dL (ref 8–23)
CO2: 28 mmol/L (ref 22–32)
Calcium: 10.1 mg/dL (ref 8.9–10.3)
Chloride: 103 mmol/L (ref 98–111)
Creatinine, Ser: 1.1 mg/dL — ABNORMAL HIGH (ref 0.44–1.00)
GFR, Estimated: 50 mL/min — ABNORMAL LOW (ref >60)
Glucose, Bld: 172 mg/dL — ABNORMAL HIGH (ref 70–99)
Potassium: 3.8 mmol/L (ref 3.5–5.1)
Sodium: 138 mmol/L (ref 135–145)
Total Bilirubin: 2.2 mg/dL — ABNORMAL HIGH (ref 0.3–1.2)
Total Protein: 6.6 g/dL (ref 6.5–8.1)

## 2020-08-11 LAB — TSH: TSH: 3.422 u[IU]/mL (ref 0.350–4.500)

## 2020-08-11 LAB — CBC
HCT: 34.3 % — ABNORMAL LOW (ref 36.0–46.0)
Hemoglobin: 11.4 g/dL — ABNORMAL LOW (ref 12.0–15.0)
MCH: 32 pg (ref 26.0–34.0)
MCHC: 33.2 g/dL (ref 30.0–36.0)
MCV: 96.3 fL (ref 80.0–100.0)
Platelets: 187 10*3/uL (ref 150–400)
RBC: 3.56 MIL/uL — ABNORMAL LOW (ref 3.87–5.11)
RDW: 13.1 % (ref 11.5–15.5)
WBC: 5.3 10*3/uL (ref 4.0–10.5)
nRBC: 0 % (ref 0.0–0.2)

## 2020-08-11 LAB — IRON AND TIBC
Iron: 117 ug/dL (ref 28–170)
Saturation Ratios: 30 % (ref 10.4–31.8)
TIBC: 385 ug/dL (ref 250–450)
UIBC: 268 ug/dL

## 2020-08-11 LAB — VITAMIN B12: Vitamin B-12: 1586 pg/mL — ABNORMAL HIGH (ref 180–914)

## 2020-08-11 LAB — RETICULOCYTES
Immature Retic Fract: 9.8 % (ref 2.3–15.9)
RBC.: 3.73 MIL/uL — ABNORMAL LOW (ref 3.87–5.11)
Retic Count, Absolute: 56.3 10*3/uL (ref 19.0–186.0)
Retic Ct Pct: 1.5 % (ref 0.4–3.1)

## 2020-08-11 LAB — MAGNESIUM: Magnesium: 1.8 mg/dL (ref 1.7–2.4)

## 2020-08-11 LAB — PHOSPHORUS: Phosphorus: 3 mg/dL (ref 2.5–4.6)

## 2020-08-11 LAB — FOLATE: Folate: 24.6 ng/mL (ref >5.9)

## 2020-08-11 LAB — FERRITIN: Ferritin: 38 ng/mL (ref 11–307)

## 2020-08-11 NOTE — TOC Benefit Eligibility Note (Signed)
Transition of Care [facility]) Benefit Eligibility Note    Patient Details  Name: Mary Mccarthy MRN: [id number] Date of Birth: 1940/08/17   Medication/Dose: ELIQUIS  5 MG BID  Covered?: Yes  Tier: 3 Drug  Prescription Coverage Preferred Pharmacy: [facility]  Spoke with Person/Company/Phone Number:: PEACH   @  [employer] #  [phone]  Co-Pay: $47.00  Prior Approval: No  Deductible: Unmet (OUT-OF-POCKET:UNMET)  Additional Notes: [place] : Earle Gell Phone Number: 08/11/2020, 3:17 PM

## 2020-08-11 NOTE — Progress Notes (Signed)
Physical Therapy Treatment Patient Details Name: Mary Mccarthy MRN: [id number] DOB: 06-Dec-1940 Today's Date: 08/11/2020    History of Present Illness Pt is 43 female who presented with chest pressure and slurred speech on 08/10/20.  She was found to be in Afib with RVR with waxing/waning neurological symptoms.  MRI did reveal small diffuse bilateral acute cerebellar infarcts. Pt with medical history including anemia, diastolic heart failure, and DM2.    PT Comments    Pt received in supine, agreeable to therapy session and with good participation and tolerance for gait and seated exercise training. Emphasis on standing balance/RW use, gait progression, fall risk prevention, seated strengthening activities, L sided attention (pt tending to clip L corner of RW on objects in room). Pt continues to benefit from PT services to progress toward functional mobility goals. Continue to recommend HHPT.   Follow Up Recommendations  Home health PT;Supervision - Intermittent     Equipment Recommendations  Other (comment);None recommended by PT (has DME)    Recommendations for Other Services       Precautions / Restrictions Precautions Precautions: Fall Restrictions Weight Bearing Restrictions: No    Mobility  Bed Mobility Overal bed mobility: Needs Assistance Bed Mobility: Rolling;Sidelying to Sit Rolling: Min guard Sidelying to sit: Min assist       General bed mobility comments: flat HOB no rails, needs trunk assist to reach upright    Transfers Overall transfer level: Needs assistance Equipment used: Rolling walker (2 wheeled) Transfers: Sit to/from Stand Sit to Stand: Min guard         General transfer comment: from EOB to RW  Ambulation/Gait Ambulation/Gait assistance: Min guard Gait Distance (Feet): 120 Feet Assistive device: Rolling walker (2 wheeled) Gait Pattern/deviations: Step-to pattern;Decreased stride length;Decreased dorsiflexion - left;Decreased step length -  left;Shuffle (decreased foot clearance on LLE)     General Gait Details: min guard for safety and pt needs min cues for proximity to RW/posture, pt seems to have decreased L sided awareness and tending to run into objects in room with L side of RW (vs poor vision although pt reports no blurry/double vision), cues for L step height with temporary improvement; pt reports only minimal fatigue with exertion but appears moderately fatigued at least   Stairs             Wheelchair Mobility    Modified Rankin (Stroke Patients Only)       Balance Overall balance assessment: Needs assistance   Sitting balance-[name]: Good     Standing balance support: No upper extremity supported;Bilateral upper extremity supported;During functional activity Standing balance-[name]: Fair Standing balance comment: BUE support dynamically but able to static stand and reach 2-4" with U UE support                            Cognition Arousal/Alertness: Awake/alert Behavior During Therapy: WFL for tasks assessed/performed Overall Cognitive Status: Within Functional Limits for tasks assessed                                        Exercises General Exercises - Lower Extremity Ankle Circles/Pumps: AROM;Both;20 reps;Seated Long Arc Quad: AROM;Both;20 reps;Seated (x2 sets) Hip Flexion/Marching: AROM;Both;20 reps;Seated (x2 sets)    General Comments General comments (skin integrity, edema, etc.): VSS      Pertinent Vitals/Pain Pain Assessment: No/denies pain    Home  Living                      Prior Function            PT Goals (current goals can now be found in the care plan section) Acute Rehab PT Goals Patient Stated Goal: return home PT Goal Formulation: With patient Time For Goal Achievement: 08/24/20 Progress towards PT goals: Progressing toward goals    Frequency    Min 4X/week      PT Plan Current plan remains appropriate     Co-evaluation              AM-PAC PT "6 Clicks" Mobility   Outcome Measure  Help needed turning from your back to your side while in a flat bed without using bedrails?: A Little Help needed moving from lying on your back to sitting on the side of a flat bed without using bedrails?: A Little Help needed moving to and from a bed to a chair (including a wheelchair)?: A Little Help needed standing up from a chair using your arms (e.g., wheelchair or bedside chair)?: A Little Help needed to walk in hospital room?: A Little Help needed climbing 3-5 steps with a railing? : A Lot 6 Click Score: 17    End of Session Equipment Utilized During Treatment: Gait belt Activity Tolerance: Patient tolerated treatment well Patient left: in chair;with call bell/phone within reach;with chair alarm set Nurse Communication: Mobility status PT Visit Diagnosis: Unsteadiness on feet (R26.81);Other abnormalities of gait and mobility (R26.89);Muscle weakness (generalized) (M62.81)     Time: 0272-5366 PT Time Calculation (min) (ACUTE ONLY): 32 min  Charges:  $Gait Training: 8-22 mins $Therapeutic Exercise: 8-22 mins                     Kjell Brannen P., PTA Acute Rehabilitation Services Pager: [phone] Office: [phone]    Angus Palms 08/11/2020, 4:25 PM

## 2020-08-11 NOTE — Evaluation (Signed)
Occupational Therapy Evaluation Patient Details Name: Mary Mccarthy MRN: [id number] DOB: 1940-03-15 Today's Date: 08/11/2020    History of Present Illness Pt is 60 female who presented with chest pressure and slurred speech on 08/10/20.  She was found to be in Afib with RVR with waxing/waning neurological symptoms.  MRI did reveal small diffuse bilateral acute cerebellar infarcts. Pt with medical history including anemia, diastolic heart failure, and DM2.   Clinical Impression   PTA patient reports independent with ADLs, IADLs and driving, using cane as needed. Admitted for above and presenting with problem list below, including mild L sided weakness, decreased activity tolerance and impaired balance. She currently requires min guard for transfers and in room mobility using RW, up to min assist for ADLs. Believe she will benefit from further OT services acutely and after dc at [facility] level to optimize independence and safety with ADLs and IADLs. Will follow.     Follow Up Recommendations  Home health OT;Supervision - Intermittent    Equipment Recommendations  None recommended by OT    Recommendations for Other Services       Precautions / Restrictions Precautions Precautions: Fall Restrictions Weight Bearing Restrictions: No      Mobility Bed Mobility Overal bed mobility: Needs Assistance Bed Mobility: Supine to Sit     Supine to sit: Min guard;HOB elevated     General bed mobility comments: + rail, increased time and min guard for safety    Transfers Overall transfer level: Needs assistance Equipment used: Rolling walker (2 wheeled) Transfers: Sit to/from Stand Sit to Stand: Min guard         General transfer comment: for safety    Balance Overall balance assessment: Needs assistance   Sitting balance-[name]: Good     Standing balance support: No upper extremity supported;Bilateral upper extremity supported;During functional activity Standing balance-Leahy  Scale: Fair Standing balance comment: BUE support dynamically but able to engage in ADLs with 0-1 UE support at sink                           ADL either performed or assessed with clinical judgement   ADL Overall ADL's : Needs assistance/impaired     Grooming: Min guard;Standing       Lower Body Bathing: Minimal assistance;Sit to/from stand   Upper Body Dressing : Set up;Sitting   Lower Body Dressing: Minimal assistance;Sit to/from stand Lower Body Dressing Details (indicate cue type and reason): assist for L sock, able to don R sock; min guard sit to stand Toilet Transfer: Min guard;Ambulation;RW   Toileting- Architect and Hygiene: Min guard;Sit to/from stand       Functional mobility during ADLs: Min guard;Rolling walker General ADL Comments: pt limited by weakness, decreased activity tolerance     Vision Patient Visual Report: No change from baseline       Perception     Praxis      Pertinent Vitals/Pain Pain Assessment: No/denies pain     Hand Dominance Right   Extremity/Trunk Assessment Upper Extremity Assessment Upper Extremity Assessment: LUE deficits/detail LUE Deficits / Details: mild L UE weakness (4+/5 MMT compared to 5/5 R UE), but coordination and sesnsation WFL LUE Sensation: WNL LUE Coordination: WNL   Lower Extremity Assessment Lower Extremity Assessment: Defer to PT evaluation       Communication Communication Communication: No difficulties   Cognition Arousal/Alertness: Awake/alert Behavior During Therapy: WFL for tasks assessed/performed Overall Cognitive Status: Within Functional Limits for  tasks assessed                                     General Comments  VSS    Exercises     Shoulder Instructions      Home Living Family/patient expects to be discharged to:: Private residence Living Arrangements: Children Available Help at Discharge: Family;Available PRN/intermittently Type of  Home: House Home Access: Stairs to enter [employer] of Steps: 1 Entrance Stairs-Rails: None Home Layout: One level     Bathroom Shower/Tub: Producer, television/film/video: Standard     Home Equipment: Environmental consultant - 2 wheels;Cane - single point;Grab bars - tub/shower   Additional Comments: son works from 4am-3-4pm      Prior Functioning/Environment Level of Independence: Independent with assistive device(s)  Gait / Transfers Assistance Needed: Pt typically ambulates with cane in commuinty, reports rarely using it in her home. ADL's / Homemaking Assistance Needed: independent ADLs, IADLs and driving            OT Problem List: Decreased strength;Decreased activity tolerance;Impaired balance (sitting and/or standing);Decreased knowledge of use of DME or AE;Decreased knowledge of precautions;Obesity      OT Treatment/Interventions: Self-care/ADL training;DME and/or AE instruction;Therapeutic activities;Patient/family education;Balance training;Neuromuscular education    OT Goals(Current goals can be found in the care plan section) Acute Rehab OT Goals Patient Stated Goal: return home OT Goal Formulation: With patient Time For Goal Achievement: 08/25/20 Potential to Achieve Goals: Good  OT Frequency: Min 2X/week   Barriers to D/C:            Co-evaluation              AM-PAC OT "6 Clicks" Daily Activity     Outcome Measure Help from another person eating meals?: None Help from another person taking care of personal grooming?: A Little Help from another person toileting, which includes using toliet, bedpan, or urinal?: A Little Help from another person bathing (including washing, rinsing, drying)?: A Little Help from another person to put on and taking off regular upper body clothing?: A Little Help from another person to put on and taking off regular lower body clothing?: A Little 6 Click Score: 19   End of Session Equipment Utilized During Treatment:  Rolling walker;Gait belt Nurse Communication: Mobility status  Activity Tolerance: Patient tolerated treatment well Patient left: in chair;with call bell/phone within reach;with chair alarm set  OT Visit Diagnosis: Other abnormalities of gait and mobility (R26.89);Muscle weakness (generalized) (M62.81);Other symptoms and signs involving the nervous system (R29.898)                Time: 4034-7425 OT Time Calculation (min): 33 min Charges:  OT General Charges $OT Visit: 1 Visit OT Evaluation $OT Eval Moderate Complexity: 1 Mod OT Treatments $Self Care/Home Management : 8-22 mins  Barry Brunner, OT Acute Rehabilitation Services Pager [phone] Office [phone]   Chancy Milroy 08/11/2020, 10:19 AM

## 2020-08-11 NOTE — Progress Notes (Signed)
PROGRESS NOTE    Debhora Titus  UEA:[id number] DOB: 07-15-1940 DOA: 08/10/2020 PCP: Corwin Levins, MD     Brief Narrative:  Mary Mccarthy is an 80 y.o. WF PMHx iron deficiency anemia chronic diastolic heart failure, DM Type 2   Comes into the emergency room for palpitations chest pressure and slurred speech upon evaluation in the ED he was found to be in A. fib with RVR due to patient waxing and waning neurological symptoms neurology was consulted as an drip CT of the head showed no acute intracranial abnormalities.  MRI of the brain showed small acute infarct diffusion bilaterally   Subjective: A/O x4, negative CP, negative SOB, negative HA.  Negative slurred speech.   Assessment & Plan: Covid vaccination; vaccinated 3/4   Principal Problem:   Atrial fibrillation with rapid ventricular response (HCC) Active Problems:   Type 2 diabetes mellitus with hyperglycemia, without long-term current use of insulin (HCC)   Hyperlipidemia with target LDL less than 70   Essential hypertension   Chronic diastolic CHF (congestive heart failure) (HCC)   Slurred speech   Hypomagnesemia    Acute CVA: -Small acute infarcts in the high right posterior frontal white matter, left caudate, right thalamus, and bilateral cerebellar hemispheres A shower of strokes point to an embolic etiology either from the calcification of the aortic valve or clots from atrial fibrillation. Will continue with stroke workup. Carotid Dopplers: 1-39% stenosis in the carotids bilaterally, normal flow in the vertebrals and subclavians 2D Echo: Severe aortic valve stenosis due to calcification, LVEF 55-60%, mild LA dilatation Loop recorder LDL 53, under goal of 70 HgbA1c 7.4, over goal of 7.0 and elevated from four months ago (7.2) Continue lipitor 40mg  and .  Consider Eliquis given new onset A. fib and by cerebral embolic strokes Ongoing aggressive stroke risk factor management Per stroke team patient requires sleep  study to evaluate for sleep apnea upon discharge. Per stroke team patient requires loop recorder prior to discharge. - Schedule home health, and PT.   DM type II uncontrolled with hyperglycemia/without long-term current use of insulin (HCC) -continue to hold oral hypoglycemic agents. -She is currently n.p.o. continue sliding scale insulin CBGs before meals and at bedtime.    Hyperlipidemia with target LDL less than 70 -Lipitor 40 mg daily   Atrial fibrillation with rapid ventricular response (HCC): -Apixaban 5 mg  BID -Coreg 6.25 mg  BID -Irbesartan 300 mg daily   Essential hypertension -Allow permissive hypertension.   Chronic diastolic CHF (congestive heart failure) (HCC) -See A. fib   Hypomagnesemia -Magnesium goal> 2  Obese (BMI 39.55 kg/m) - Would benefit from nutritional counseling long-term once discharged.       DVT prophylaxis: Apixaban Code Status: Full Family Communication:  Status is: Inpatient  Remains inpatient appropriate because:Ongoing diagnostic testing needed not appropriate for outpatient work up  Dispo: The patient is from: Home              Anticipated d/c is to: Home              Patient currently is not medically stable to d/c.              Difficult to place patient No      Consultants:  Neurology stroke team  Procedures/Significant Events:    I have personally reviewed and interpreted all radiology studies and my findings are as above.  VENTILATOR SETTINGS:    Cultures   Antimicrobials:    Devices    LINES /  TUBES:      Continuous Infusions:   Objective: Vitals:   08/11/20 0013 08/11/20 0426 08/11/20 0428 08/11/20 0753  BP: (!) 155/43  (!) 154/55 (!) 175/53  Pulse: (!) 59  64 64  Resp: 18  18   Temp: (!) 97.2 F (36.2 C)  98.8 F (37.1 C) 98.2 F (36.8 C)  TempSrc: Oral  Oral Oral  SpO2: 96%  96% 94%  Weight:  94.9 kg    Height:        Intake/Output Summary (Last 24 hours) at 08/11/2020 9024 Last  data filed at 08/11/2020 0427 Gross per 24 hour  Intake 680 ml  Output 850 ml  Net -170 ml   Filed Weights   08/10/20 0053 08/11/20 0426  Weight: 95.3 kg 94.9 kg    Examination:  General: A/O x4, No acute respiratory distress Eyes: negative scleral hemorrhage, negative anisocoria, negative icterus ENT: Negative Runny nose, negative gingival bleeding, Neck:  Negative scars, masses, torticollis, lymphadenopathy, JVD Lungs: Clear to auscultation bilaterally without wheezes or crackles Cardiovascular: Regular rate and rhythm without murmur gallop or rub normal S1 and S2 Abdomen: negative abdominal pain, nondistended, positive soft, bowel sounds, no rebound, no ascites, no appreciable mass Extremities: No significant cyanosis, clubbing, or edema bilateral lower extremities Skin: Negative rashes, lesions, ulcers Psychiatric:  Negative depression, negative anxiety, negative fatigue, negative mania  Central nervous system:  Cranial nerves II through XII intact, tongue/uvula midline, all extremities muscle strength 5/5, sensation intact throughout, finger nose finger bilateral within normal limits, quick finger touch bilateral within normal limits, heel to shin bilateral within normal limits, negative dysarthria, negative expressive aphasia, negative receptive aphasia.  Slight left facial droop.  .     Data Reviewed: Care during the described time interval was provided by me .  I have reviewed this patient's available data, including medical history, events of note, physical examination, and all test results as part of my evaluation.  CBC: Recent Labs  Lab 08/10/20 0108 08/10/20 0113 08/11/20 0312  WBC  --  7.9 5.3  NEUTROABS  --  5.4  --   HGB 12.2 11.9* 11.4*  HCT 36.0 35.7* 34.3*  MCV  --  97.5 96.3  PLT  --  229 187   Basic Metabolic Panel: Recent Labs  Lab 08/10/20 0108 08/10/20 0113  NA 138 136  K 3.9 3.9  CL 105 105  CO2  --  23  GLUCOSE 278* 276*  BUN 28* 28*   CREATININE 1.00 1.21*  CALCIUM  --  9.9  MG  --  1.5*   GFR: Estimated Creatinine Clearance: 38.3 mL/min (A) (by C-G formula based on SCr of 1.21 mg/dL (H)). Liver Function Tests: Recent Labs  Lab 08/10/20 0113  AST 27  ALT 17  ALKPHOS 42  BILITOT 1.2  PROT 6.4*  ALBUMIN 3.5   No results for input(s): LIPASE, AMYLASE in the last 168 hours. No results for input(s): AMMONIA in the last 168 hours. Coagulation Profile: Recent Labs  Lab 08/10/20 0113  INR 1.1   Cardiac Enzymes: No results for input(s): CKTOTAL, CKMB, CKMBINDEX, TROPONINI in the last 168 hours. BNP (last 3 results) No results for input(s): PROBNP in the last 8760 hours. HbA1C: Recent Labs    08/10/20 1618  HGBA1C 7.4*   CBG: Recent Labs  Lab 08/10/20 0740 08/10/20 1233 08/10/20 1805 08/10/20 2122 08/11/20 0542  GLUCAP 159* 108* 137* 132* 132*   Lipid Profile: Recent Labs    08/10/20 1618  CHOL  99  HDL 31*  LDLCALC 53  TRIG 76  CHOLHDL 3.2   Thyroid Function Tests: Recent Labs    08/11/20 0312  TSH 3.422   Anemia Panel: No results for input(s): VITAMINB12, FOLATE, FERRITIN, TIBC, IRON, RETICCTPCT in the last 72 hours. Sepsis Labs: No results for input(s): PROCALCITON, LATICACIDVEN in the last 168 hours.  Recent Results (from the past 240 hour(s))  Resp Panel by RT-PCR (Flu A&B, Covid) Nasopharyngeal Swab     Status: None   Collection Time: 08/10/20  1:12 AM   Specimen: Nasopharyngeal Swab; Nasopharyngeal(NP) swabs in vial transport medium  Result Value Ref Range Status   SARS Coronavirus 2 by RT PCR NEGATIVE NEGATIVE Final    Comment: (NOTE) SARS-CoV-2 target nucleic acids are NOT DETECTED.  The SARS-CoV-2 RNA is generally detectable in upper respiratory specimens during the acute phase of infection. The lowest concentration of SARS-CoV-2 viral copies this assay can detect is 138 copies/mL. A negative result does not preclude SARS-Cov-2 infection and should not be used as the  sole basis for treatment or other patient management decisions. A negative result may occur with  improper specimen collection/handling, submission of specimen other than nasopharyngeal swab, presence of viral mutation(s) within the areas targeted by this assay, and inadequate number of viral copies(<138 copies/mL). A negative result must be combined with clinical observations, patient history, and epidemiological information. The expected result is Negative.  Fact Sheet for Patients:  BloggerCourse.com  Fact Sheet for Healthcare Providers:  SeriousBroker.it  This test is no t yet approved or cleared by the [place] FDA and  has been authorized for detection and/or diagnosis of SARS-CoV-2 by FDA under an Emergency Use Authorization (EUA). This EUA will remain  in effect (meaning this test can be used) for the duration of the COVID-19 declaration under Section 564(b)(1) of the Act, 21 U.S.C.section 360bbb-3(b)(1), unless the authorization is terminated  or revoked sooner.       Influenza A by PCR NEGATIVE NEGATIVE Final   Influenza B by PCR NEGATIVE NEGATIVE Final    Comment: (NOTE) The Xpert Xpress SARS-CoV-2/FLU/RSV plus assay is intended as an aid in the diagnosis of influenza from Nasopharyngeal swab specimens and should not be used as a sole basis for treatment. Nasal washings and aspirates are unacceptable for Xpert Xpress SARS-CoV-2/FLU/RSV testing.  Fact Sheet for Patients: BloggerCourse.com  Fact Sheet for Healthcare Providers: SeriousBroker.it  This test is not yet approved or cleared by the [place] FDA and has been authorized for detection and/or diagnosis of SARS-CoV-2 by FDA under an Emergency Use Authorization (EUA). This EUA will remain in effect (meaning this test can be used) for the duration of the COVID-19 declaration under Section 564(b)(1) of the  Act, 21 U.S.C. section 360bbb-3(b)(1), unless the authorization is terminated or revoked.  Performed at [facility], [address], [place], [place] [address]          Radiology Studies: CT HEAD WO CONTRAST  Result Date: 08/10/2020 CLINICAL DATA:  TIA EXAM: CT HEAD WITHOUT CONTRAST TECHNIQUE: Contiguous axial images were obtained from the base of the skull through the vertex without intravenous contrast. COMPARISON:  None. FINDINGS: Brain: No evidence of acute infarction, hemorrhage, hydrocephalus, extra-axial collection or mass lesion/mass effect. Subcortical white matter and periventricular small vessel ischemic changes. Vascular: Intracranial atherosclerosis. Skull: Normal. Negative for fracture or focal lesion. Sinuses/Orbits: The visualized paranasal sinuses are essentially clear. The mastoid air cells are unopacified. Other: None. IMPRESSION: No evidence of acute intracranial  abnormality. Small vessel ischemic changes. Electronically Signed   By: Charline Bills M.D.   On: 08/10/2020 01:54   MR ANGIO HEAD WO CONTRAST  Result Date: 08/10/2020 CLINICAL DATA:  Transient ischemic attack. EXAM: MRI HEAD WITHOUT CONTRAST MRA HEAD WITHOUT CONTRAST TECHNIQUE: Multiplanar, multi-echo pulse sequences of the brain and surrounding structures were acquired without intravenous contrast. Angiographic images of the Circle of Willis were acquired using MRA technique without intravenous contrast. COMPARISON:  No pertinent prior exam. FINDINGS: MRI HEAD FINDINGS Brain: Small acute infarcts in the high right posterior frontal white matter, left caudate, right thalamus, and bilateral cerebellar hemispheres. No substantial edema or mass effect. Severe patchy fluid T2/FLAIR hyperintensities within the white matter, nonspecific but most likely related to chronic microvascular ischemic disease. Remote lacunar infarcts involving bilateral cerebellar hemispheres, right thalamus, and left corona radiata. Mild  generalized atrophy with ex vacuo ventricular dilation. No hydrocephalus, acute hemorrhage, mass lesion, midline shift, or extra-axial fluid collection. Vascular: See below. Skull and upper cervical spine: Normal marrow signal. Sinuses/Orbits: Mild mucosal thickening of scattered ethmoid air cells. Other: No mastoid effusions. MRA HEAD FINDINGS Anterior circulation: Bilateral ICAs, MCAs, and ACAs are patent without flow-limiting proximal stenosis. No aneurysm identified. Posterior circulation: Bilateral visualized intradural vertebral arteries, basilar artery, and posterior cerebral arteries are patent without flow-limiting proximal stenosis. Right larger than left posterior communicating arteries. No aneurysm identified. Anatomic variants: See above. IMPRESSION: MRI: 1. Small acute infarcts in the high right posterior frontal white matter, left caudate, right thalamus, and bilateral cerebellar hemispheres. No substantial edema or mass effect. Given involvement of multiple vascular territories, consider a central embolic etiology. 2. Severe chronic microvascular ischemic disease and remote lacunar infarcts. MRA: No large vessel occlusion or proximal flow limiting stenosis. Electronically Signed   By: Feliberto Harts MD   On: 08/10/2020 09:57   MR BRAIN WO CONTRAST  Result Date: 08/10/2020 CLINICAL DATA:  Transient ischemic attack. EXAM: MRI HEAD WITHOUT CONTRAST MRA HEAD WITHOUT CONTRAST TECHNIQUE: Multiplanar, multi-echo pulse sequences of the brain and surrounding structures were acquired without intravenous contrast. Angiographic images of the Circle of Willis were acquired using MRA technique without intravenous contrast. COMPARISON:  No pertinent prior exam. FINDINGS: MRI HEAD FINDINGS Brain: Small acute infarcts in the high right posterior frontal white matter, left caudate, right thalamus, and bilateral cerebellar hemispheres. No substantial edema or mass effect. Severe patchy fluid T2/FLAIR  hyperintensities within the white matter, nonspecific but most likely related to chronic microvascular ischemic disease. Remote lacunar infarcts involving bilateral cerebellar hemispheres, right thalamus, and left corona radiata. Mild generalized atrophy with ex vacuo ventricular dilation. No hydrocephalus, acute hemorrhage, mass lesion, midline shift, or extra-axial fluid collection. Vascular: See below. Skull and upper cervical spine: Normal marrow signal. Sinuses/Orbits: Mild mucosal thickening of scattered ethmoid air cells. Other: No mastoid effusions. MRA HEAD FINDINGS Anterior circulation: Bilateral ICAs, MCAs, and ACAs are patent without flow-limiting proximal stenosis. No aneurysm identified. Posterior circulation: Bilateral visualized intradural vertebral arteries, basilar artery, and posterior cerebral arteries are patent without flow-limiting proximal stenosis. Right larger than left posterior communicating arteries. No aneurysm identified. Anatomic variants: See above. IMPRESSION: MRI: 1. Small acute infarcts in the high right posterior frontal white matter, left caudate, right thalamus, and bilateral cerebellar hemispheres. No substantial edema or mass effect. Given involvement of multiple vascular territories, consider a central embolic etiology. 2. Severe chronic microvascular ischemic disease and remote lacunar infarcts. MRA: No large vessel occlusion or proximal flow limiting stenosis. Electronically Signed   By: Gelene Mink  Barnett Applebaum MD   On: 08/10/2020 09:57   DG Chest Port 1 View  Result Date: 08/10/2020 CLINICAL DATA:  Atrial fibrillation EXAM: PORTABLE CHEST 1 VIEW COMPARISON:  03/14/2018 FINDINGS: The heart size and mediastinal contours are within normal limits. Both lungs are clear. The visualized skeletal structures are unremarkable. IMPRESSION: No active disease. Electronically Signed   By: Deatra Robinson M.D.   On: 08/10/2020 01:23   ECHOCARDIOGRAM COMPLETE  Result Date: 08/10/2020     ECHOCARDIOGRAM REPORT   Patient Name:   BETZAIRA MENTEL Date of Exam: 08/10/2020 Medical Rec #:  [id number]      Height:       61.0 in Accession #:    [id number]     Weight:       210.0 lb Date of Birth:  [birth year]      BSA:          1.928 m Patient Age:    79 years       BP:           156/56 mmHg Patient Gender: F              HR:           49 bpm. Exam Location:  Inpatient Procedure: 2D Echo, Cardiac Doppler and Color Doppler                     STAT ECHO Reported to: Chandrasekhar on 08/10/2020 8:38:00 AM. Indications:    TIA  History:        Patient has prior history of Echocardiogram examinations, most                 recent 10/01/2019. CHF, CAD and Previous Myocardial Infarction,                 Arrythmias:Atrial Fibrillation, Signs/Symptoms:Murmur; Risk                 Factors:Dyslipidemia, Hypertension and Diabetes. 05/21/2015 cath.  Sonographer:    Neomia Dear RDCS Referring Phys: [id number] Marinda Elk  Sonographer Comments: Patient is morbidly obese. Image acquisition challenging due to patient body habitus. IMPRESSIONS  1. The aortic valve is calcified. Aortic valve regurgitation is not visualized. Severe aortic valve stenosis. Aortic valve area, by VTI measures 0.86 cm. Aortic valve mean gradient measures 42.0 mmHg. Aortic valve Vmax measures 4.36 m/s.  2. Left ventricular ejection fraction, by estimation, is 55 to 60%. The left ventricle has normal function. The left ventricle has no regional wall motion abnormalities. There is severe concentric left ventricular hypertrophy. Left ventricular diastolic  parameters are indeterminate.  3. Right ventricular systolic function is normal. The right ventricular size is normal. There is moderately elevated pulmonary artery systolic pressure.  4. Left atrial size was mildly dilated.  5. The pericardial effusion is circumferential.  6. The mitral valve is abnormal. Trivial mitral valve regurgitation. The mean mitral valve gradient is 2.0 mmHg with average heart  rate of 53 bpm. Severe mitral annular calcification. Comparison(s): A prior study was performed on 10/01/2019. Prior images reviewed side by side. Further increase in aortic valve gradients. Conclusion(s)/Recommendation(s): If urgent to emergent non-cardiac surgery is considered, care to manage hypotension should be considered in the setting of severe aortic stenosis. FINDINGS  Left Ventricle: NO LVOTO. Left ventricular ejection fraction, by estimation, is 55 to 60%. The left ventricle has normal function. The left ventricle has no regional wall motion abnormalities. The left ventricular internal cavity size was normal  in size. There is severe concentric left ventricular hypertrophy. Left ventricular diastolic parameters are indeterminate. Right Ventricle: The right ventricular size is normal. No increase in right ventricular wall thickness. Right ventricular systolic function is normal. There is moderately elevated pulmonary artery systolic pressure. The tricuspid regurgitant velocity is 3.72 m/s, and with an assumed right atrial pressure of 3 mmHg, the estimated right ventricular systolic pressure is 58.4 mmHg. Left Atrium: Left atrial size was mildly dilated. Right Atrium: Right atrial size was normal in size. Pericardium: Trivial pericardial effusion is present. The pericardial effusion is circumferential. Mitral Valve: The mitral valve is abnormal. Severe mitral annular calcification. Trivial mitral valve regurgitation. MV peak gradient, 7.0 mmHg. The mean mitral valve gradient is 2.0 mmHg with average heart rate of 53 bpm. Tricuspid Valve: The tricuspid valve is normal in structure. Tricuspid valve regurgitation is mild . No evidence of tricuspid stenosis. Aortic Valve: The aortic valve is calcified. Aortic valve regurgitation is not visualized. Severe aortic stenosis is present. Aortic valve mean gradient measures 42.0 mmHg. Aortic valve peak gradient measures 76.0 mmHg. Aortic valve area, by VTI measures   0.86 cm. Pulmonic Valve: The pulmonic valve was grossly normal. Pulmonic valve regurgitation is not visualized. No evidence of pulmonic stenosis. Aorta: The aortic root is normal in size and structure. IAS/Shunts: The atrial septum is grossly normal.  LEFT VENTRICLE PLAX 2D LVIDd:         4.00 cm     Diastology LVIDs:         2.80 cm     LV e' medial:    6.20 cm/s LV PW:         1.70 cm     LV E/e' medial:  19.7 LV IVS:        1.50 cm     LV e' lateral:   5.33 cm/s LVOT diam:     2.00 cm     LV E/e' lateral: 22.9 LV SV:         81 LV SV Index:   42 LVOT Area:     3.14 cm  LV Volumes (MOD) LV vol d, MOD A4C: 63.5 ml LV vol s, MOD A4C: 30.4 ml LV SV MOD A4C:     63.5 ml RIGHT VENTRICLE RV S prime:     15.40 cm/s TAPSE (M-mode): 2.9 cm LEFT ATRIUM             Index       RIGHT ATRIUM           Index LA diam:        5.00 cm 2.59 cm/m  RA Area:     20.50 cm LA Vol (A2C):   59.5 ml 30.86 ml/m RA Volume:   62.40 ml  32.36 ml/m LA Vol (A4C):   76.6 ml 39.72 ml/m LA Biplane Vol: 69.1 ml 35.83 ml/m  AORTIC VALVE                    PULMONIC VALVE AV Area (Vmax):    0.73 cm     PV Vmax:       1.62 m/s AV Area (Vmean):   0.75 cm     PV Vmean:      135.000 cm/s AV Area (VTI):     0.86 cm     PV VTI:        0.496 m AV Vmax:           436.00 cm/s  PV Peak grad:  10.5 mmHg AV Vmean:          273.750 cm/s PV Mean grad:  9.0 mmHg AV VTI:            0.950 m AV Peak Grad:      76.0 mmHg AV Mean Grad:      42.0 mmHg LVOT Vmax:         102.00 cm/s LVOT Vmean:        65.400 cm/s LVOT VTI:          0.259 m LVOT/AV VTI ratio: 0.27  AORTA Ao Root diam: 2.70 cm Ao Asc diam:  2.80 cm MITRAL VALVE                 TRICUSPID VALVE MV Area (PHT): 3.48 cm      TR Peak grad:   55.4 mmHg MV Area VTI:   1.76 cm      TR Vmax:        372.00 cm/s MV Peak grad:  7.0 mmHg MV Mean grad:  2.0 mmHg      SHUNTS MV Vmax:       1.32 m/s      Systemic VTI:  0.26 m MV Vmean:      67.9 cm/s     Systemic Diam: 2.00 cm MV Decel Time: 218 msec MR Peak  grad:    63.0 mmHg MR Mean grad:    47.0 mmHg MR Vmax:         397.00 cm/s MR Vmean:        333.0 cm/s MR PISA:         0.57 cm MR PISA Eff ROA: 3 mm MR PISA Radius:  0.30 cm MV E velocity: 122.00 cm/s MV A velocity: 74.60 cm/s MV E/A ratio:  1.64 Riley Lam MD Electronically signed by Riley Lam MD Signature Date/Time: 08/10/2020/8:56:46 AM    Final    VAS US CAROTID (at [facility] and [facility] only)  Result Date: 08/10/2020 Carotid Arterial Duplex Study Patient Name:  LACHE DAGHER  Date of Exam:   08/10/2020 Medical Rec #: [id number]       Accession #:    [id number] Date of Birth: May 27, 1940       Patient Gender: F Patient Age:   79Y Exam Location:  [facility] Procedure:      VAS US CAROTID Referring Phys: [id number] [facility] --------------------------------------------------------------------------------  Indications:       CVA. Risk Factors:      Hypertension, hyperlipidemia, Diabetes. Comparison Study:  MRA head 08-10-2020, no prior carotid duplex studies. Performing Technologist: Jean Rosenthal RDMS,RVT  Examination Guidelines: A complete evaluation includes B-mode imaging, spectral Doppler, color Doppler, and power Doppler as needed of all accessible portions of each vessel. Bilateral testing is considered an integral part of a complete examination. Limited examinations for reoccurring indications may be performed as noted.  Right Carotid Findings: +----------+--------+--------+--------+--------------------------+--------+           PSV cm/sEDV cm/sStenosisPlaque Description        Comments +----------+--------+--------+--------+--------------------------+--------+ CCA Prox  79      9                                                  +----------+--------+--------+--------+--------------------------+--------+ CCA Distal72      14                                                 +----------+--------+--------+--------+--------------------------+--------+  ICA Prox  63       15      1-39%   mild heterogenous                  +----------+--------+--------+--------+--------------------------+--------+ ICA Distal65      16                                        tortuous +----------+--------+--------+--------+--------------------------+--------+ ECA       57                      calcific, smooth and focal         +----------+--------+--------+--------+--------------------------+--------+ +----------+--------+-------+----------------+-------------------+           PSV cm/sEDV cmsDescribe        Arm Pressure (mmHG) +----------+--------+-------+----------------+-------------------+ [id number]             Multiphasic, WNL                    +----------+--------+-------+----------------+-------------------+ +---------+--------+--+--------+--+---------+ VertebralPSV cm/s44EDV cm/s10Antegrade +---------+--------+--+--------+--+---------+  Left Carotid Findings: +----------+--------+--------+--------+------------------+--------+           PSV cm/sEDV cm/sStenosisPlaque DescriptionComments +----------+--------+--------+--------+------------------+--------+ CCA Prox  67      11                                         +----------+--------+--------+--------+------------------+--------+ CCA Distal65      18                                         +----------+--------+--------+--------+------------------+--------+ ICA Prox  70      15      1-39%   calcific                   +----------+--------+--------+--------+------------------+--------+ ICA Distal97      24                                         +----------+--------+--------+--------+------------------+--------+ ECA       86                                                 +----------+--------+--------+--------+------------------+--------+ +----------+--------+--------+----------------+-------------------+           PSV cm/sEDV cm/sDescribe        Arm Pressure  (mmHG) +----------+--------+--------+----------------+-------------------+ [id number]             Multiphasic, WNL                    +----------+--------+--------+----------------+-------------------+ +---------+--------+--+--------+-+---------+ VertebralPSV cm/s39EDV cm/s9Antegrade +---------+--------+--+--------+-+---------+   Summary: Right Carotid: Velocities in the right ICA are consistent with a 1-39% stenosis. Left Carotid: Velocities in the left ICA are consistent with a 1-39% stenosis. Vertebrals:  Bilateral vertebral arteries demonstrate antegrade flow. Subclavians: Normal flow hemodynamics were seen in bilateral subclavian              arteries. *See table(s) above for measurements and observations.  Electronically signed by Delia Heady MD on  08/10/2020 at 2:32:30 PM.    Final         Scheduled Meds:   stroke: mapping our early stages of recovery book   Does not apply Once   apixaban  5 mg Oral BID   aspirin EC  81 mg Oral Daily   atorvastatin  40 mg Oral Daily   carvedilol  6.25 mg Oral BID   insulin aspart  0-15 Units Subcutaneous TID AC & HS   irbesartan  300 mg Oral Daily   pantoprazole  40 mg Oral Daily   Continuous Infusions:   LOS: 0 days    Time spent:40 min    Nyiah Pianka, Roselind Messier, MD [facility]   If 7PM-7AM, please contact night-coverage 08/11/2020, 8:11 AM

## 2020-08-12 ENCOUNTER — Telehealth: Payer: Self-pay

## 2020-08-12 ENCOUNTER — Other Ambulatory Visit (HOSPITAL_COMMUNITY): Payer: Self-pay

## 2020-08-12 DIAGNOSIS — Z8673 Personal history of transient ischemic attack (TIA), and cerebral infarction without residual deficits: Secondary | ICD-10-CM

## 2020-08-12 LAB — COMPREHENSIVE METABOLIC PANEL WITH GFR
ALT: 16 U/L (ref 0–44)
AST: 21 U/L (ref 15–41)
Albumin: 3.1 g/dL — ABNORMAL LOW (ref 3.5–5.0)
Alkaline Phosphatase: 39 U/L (ref 38–126)
Anion gap: 7 (ref 5–15)
BUN: 22 mg/dL (ref 8–23)
CO2: 27 mmol/L (ref 22–32)
Calcium: 9.7 mg/dL (ref 8.9–10.3)
Chloride: 104 mmol/L (ref 98–111)
Creatinine, Ser: 1.15 mg/dL — ABNORMAL HIGH (ref 0.44–1.00)
GFR, Estimated: 48 mL/min — ABNORMAL LOW
Glucose, Bld: 117 mg/dL — ABNORMAL HIGH (ref 70–99)
Potassium: 3.8 mmol/L (ref 3.5–5.1)
Sodium: 138 mmol/L (ref 135–145)
Total Bilirubin: 1.8 mg/dL — ABNORMAL HIGH (ref 0.3–1.2)
Total Protein: 6.2 g/dL — ABNORMAL LOW (ref 6.5–8.1)

## 2020-08-12 LAB — CBC WITH DIFFERENTIAL/PLATELET
Abs Immature Granulocytes: 0.01 K/uL (ref 0.00–0.07)
Basophils Absolute: 0 K/uL (ref 0.0–0.1)
Basophils Relative: 1 %
Eosinophils Absolute: 0.1 K/uL (ref 0.0–0.5)
Eosinophils Relative: 3 %
HCT: 33 % — ABNORMAL LOW (ref 36.0–46.0)
Hemoglobin: 11.2 g/dL — ABNORMAL LOW (ref 12.0–15.0)
Immature Granulocytes: 0 %
Lymphocytes Relative: 34 %
Lymphs Abs: 1.8 K/uL (ref 0.7–4.0)
MCH: 32.7 pg (ref 26.0–34.0)
MCHC: 33.9 g/dL (ref 30.0–36.0)
MCV: 96.2 fL (ref 80.0–100.0)
Monocytes Absolute: 0.5 K/uL (ref 0.1–1.0)
Monocytes Relative: 10 %
Neutro Abs: 2.8 K/uL (ref 1.7–7.7)
Neutrophils Relative %: 52 %
Platelets: 178 K/uL (ref 150–400)
RBC: 3.43 MIL/uL — ABNORMAL LOW (ref 3.87–5.11)
RDW: 13.1 % (ref 11.5–15.5)
WBC: 5.3 K/uL (ref 4.0–10.5)
nRBC: 0 % (ref 0.0–0.2)

## 2020-08-12 LAB — GLUCOSE, CAPILLARY
Glucose-Capillary: 131 mg/dL — ABNORMAL HIGH (ref 70–99)
Glucose-Capillary: 145 mg/dL — ABNORMAL HIGH (ref 70–99)

## 2020-08-12 LAB — PHOSPHORUS: Phosphorus: 3.6 mg/dL (ref 2.5–4.6)

## 2020-08-12 LAB — MAGNESIUM: Magnesium: 1.7 mg/dL (ref 1.7–2.4)

## 2020-08-12 MED ORDER — IRBESARTAN 300 MG PO TABS
300.0000 mg | ORAL_TABLET | Freq: Every day | ORAL | 0 refills | Status: DC
  Filled 2020-08-12: qty 30, 30d supply, fill #0

## 2020-08-12 MED ORDER — METFORMIN HCL 500 MG PO TABS: 500.0000 mg | ORAL_TABLET | Freq: Two times a day (BID) | ORAL | Status: DC

## 2020-08-12 MED ORDER — GLIMEPIRIDE 2 MG PO TABS
2.0000 mg | ORAL_TABLET | Freq: Every day | ORAL | 0 refills | Status: DC
  Filled 2020-08-12: qty 30, 30d supply, fill #0

## 2020-08-12 MED ORDER — GLIMEPIRIDE 2 MG PO TABS: 2.0000 mg | ORAL_TABLET | Freq: Every day | ORAL | Status: DC

## 2020-08-12 MED ORDER — APIXABAN 5 MG PO TABS
5.0000 mg | ORAL_TABLET | Freq: Two times a day (BID) | ORAL | 0 refills | Status: DC
Start: 2020-08-12 — End: 2020-08-19
  Filled 2020-08-12: qty 60, 30d supply, fill #0

## 2020-08-12 MED ORDER — METFORMIN HCL 500 MG PO TABS: 500.0000 mg | ORAL_TABLET | Freq: Every day | ORAL | Status: DC

## 2020-08-12 MED ORDER — METFORMIN HCL 500 MG PO TABS: 1000.0000 mg | ORAL_TABLET | Freq: Every day | ORAL | Status: DC

## 2020-08-12 NOTE — Progress Notes (Signed)
Occupational Therapy Treatment Patient Details Name: Mary Mccarthy MRN: [id number] DOB: [birth year] Today's Date: 08/12/2020    History of present illness Pt is 43 female who presented with chest pressure and slurred speech on 08/10/20.  She was found to be in Afib with RVR with waxing/waning neurological symptoms.  MRI did reveal small diffuse bilateral acute cerebellar infarcts. Pt with medical history including anemia, diastolic heart failure, and DM2.   OT comments  Patient progressing well towards OT goals. Completing transfers and in room mobility using RW with supervision. Engaged in toileting, grooming and LB dressing with supervision given increased time.  Minimal cueing required for safety, RW mgmt and pacing.  Overall good safety awareness.  Discussed fall prevention, IADL engagement and safety with carrying items using RW (pt believe she has access to walker tray).  She reports having support from her son and nephews at dc. Continue to recommend HHOT services.    Follow Up Recommendations  Home health OT;Supervision - Intermittent    Equipment Recommendations  None recommended by OT    Recommendations for Other Services      Precautions / Restrictions Precautions Precautions: Fall Restrictions Weight Bearing Restrictions: No       Mobility Bed Mobility               General bed mobility comments: EOB upon entry    Transfers                      Balance Overall balance assessment: Needs assistance   Sitting balance-[name]: Good     Standing balance support: Bilateral upper extremity supported;During functional activity;No upper extremity supported Standing balance-[name]: Fair Standing balance comment: BUE support dynamically                           ADL either performed or assessed with clinical judgement   ADL Overall ADL's : Needs assistance/impaired     Grooming: Supervision/safety;Standing               Lower  Body Dressing: Supervision/safety;Sit to/from stand Lower Body Dressing Details (indicate cue type and reason): donning brief and underwear, increased time required Toilet Transfer: Supervision/safety;Ambulation;RW   Toileting- Clothing Manipulation and Hygiene: Supervision/safety;Sit to/from stand       Functional mobility during ADLs: Supervision/safety;Rolling walker;Cueing for safety General ADL Comments: transfers with supervision, cueing for safety and pacing     Vision       Perception     Praxis      Cognition Arousal/Alertness: Awake/alert Behavior During Therapy: WFL for tasks assessed/performed Overall Cognitive Status: Within Functional Limits for tasks assessed                                          Exercises     Shoulder Instructions       General Comments VSS; discussed home safety, pacing and fall prevention; pt plans to have support from son when he is home from work, but can call 2 nephews during the day as needed.    Pertinent Vitals/ Pain       Pain Assessment: No/denies pain Faces Pain Scale: No hurt  Home Living     Available Help at Discharge: Family;Available PRN/intermittently Type of Home: House  Lives With: Son    Prior Functioning/Environment              Frequency  Min 2X/week        Progress Toward Goals  OT Goals(current goals can now be found in the care plan section)  Progress towards OT goals: Progressing toward goals  Acute Rehab OT Goals Patient Stated Goal: return home OT Goal Formulation: With patient  Plan Discharge plan remains appropriate;Frequency remains appropriate    Co-evaluation                 AM-PAC OT "6 Clicks" Daily Activity     Outcome Measure   Help from another person eating meals?: None Help from another person taking care of personal grooming?: None Help from another person toileting, which includes using toliet,  bedpan, or urinal?: A Little Help from another person bathing (including washing, rinsing, drying)?: A Little Help from another person to put on and taking off regular upper body clothing?: A Little Help from another person to put on and taking off regular lower body clothing?: A Little 6 Click Score: 20    End of Session Equipment Utilized During Treatment: Rolling walker  OT Visit Diagnosis: Other abnormalities of gait and mobility (R26.89);Muscle weakness (generalized) (M62.81);Other symptoms and signs involving the nervous system (R29.898)   Activity Tolerance Patient tolerated treatment well   Patient Left in chair;with call bell/phone within reach;with chair alarm set   Nurse Communication Mobility status        Time: 4854-6270 OT Time Calculation (min): 32 min  Charges: OT General Charges $OT Visit: 1 Visit OT Treatments $Self Care/Home Management : 23-37 mins  Barry Brunner, OT Acute Rehabilitation Services Pager [phone] Office [phone]    Chancy Milroy 08/12/2020, 1:34 PM

## 2020-08-12 NOTE — Progress Notes (Signed)
Physical Therapy Treatment Patient Details Name: Mary Mccarthy MRN: [id number] DOB: 04-09-1940 Today's Date: 08/12/2020    History of Present Illness Pt is 64 female who presented with chest pressure and slurred speech on 08/10/20.  She was found to be in Afib with RVR with waxing/waning neurological symptoms.  MRI did reveal small diffuse bilateral acute cerebellar infarcts. Pt with medical history including anemia, diastolic heart failure, and DM2.    PT Comments    Pt received in chair, agreeable to limited therapy session in room as she is preparing for discharge. Primary session emphasis on safety with transfers, seated/standing balance, gradual progression of mobility within tolerance and safety with RW use. Pt would benefit from increased supervision/assist PRN from family at home and home safety assessment from PT to decrease risk of falls given post-CVA mild deficits. Pt continues to benefit from PT services to progress toward functional mobility goals.   Follow Up Recommendations  Home health PT;Supervision - Intermittent     Equipment Recommendations  None recommended by PT (has DME)    Recommendations for Other Services       Precautions / Restrictions Precautions Precautions: Fall Precaution Comments: decreased L awareness Restrictions Weight Bearing Restrictions: No    Mobility  Bed Mobility               General bed mobility comments: seated in chair pre/post    Transfers Overall transfer level: Needs assistance Equipment used: Rolling walker (2 wheeled) Transfers: Sit to/from Stand Sit to Stand: Min guard         General transfer comment: chair<>RW, needs cues for hand placement especially when sitting, poor carryover  Ambulation/Gait Ambulation/Gait assistance: Min guard;Supervision Gait Distance (Feet): 50 Feet Assistive device: Rolling walker (2 wheeled) Gait Pattern/deviations: Step-to pattern;Decreased stride length;Decreased dorsiflexion -  left;Decreased step length - left;Shuffle     General Gait Details: up to min guard for safety and pt needs min cues for proximity to RW/posture, pt did a bit better with L awareness this date but did nearly clip RW on furniture until PTA gave cues for direction; cues for improved L step height with temporary improvement; pt reports only minimal fatigue with exertion.      Balance Overall balance assessment: Needs assistance Sitting-balance support: No upper extremity supported Sitting balance-[name]: Good Sitting balance - Comments: able to reach forward to don shorts/shoes while seated no LOB   Standing balance support: Bilateral upper extremity supported;During functional activity;No upper extremity supported Standing balance-[name]: Fair Standing balance comment: able to stand unsupported with minor LOB for static standing ~30 seconds but self-correct by reaching to RW, min guard for safety with RW for gait                            Cognition Arousal/Alertness: Awake/alert Behavior During Therapy: WFL for tasks assessed/performed Overall Cognitive Status: Within Functional Limits for tasks assessed                       General Comments General comments (skin integrity, edema, etc.): VSS; discussed home safety/getting more help from family throughout day.      Pertinent Vitals/Pain Pain Assessment: No/denies pain    PT Goals (current goals can now be found in the care plan section) Acute Rehab PT Goals Patient Stated Goal: return home PT Goal Formulation: With patient Time For Goal Achievement: 08/24/20 Progress towards PT goals: Progressing toward goals  Frequency    Min 4X/week      PT Plan Current plan remains appropriate       AM-PAC PT "6 Clicks" Mobility   Outcome Measure  Help needed turning from your back to your side while in a flat bed without using bedrails?: None Help needed moving from lying on your back to sitting  on the side of a flat bed without using bedrails?: A Little Help needed moving to and from a bed to a chair (including a wheelchair)?: A Little Help needed standing up from a chair using your arms (e.g., wheelchair or bedside chair)?: A Little Help needed to walk in hospital room?: A Little Help needed climbing 3-5 steps with a railing? : A Little 6 Click Score: 19    End of Session   Activity Tolerance: Patient tolerated treatment well (session time limited due to impending DC) Patient left: in chair;with call bell/phone within reach;with nursing/sitter in room Nurse Communication: Mobility status PT Visit Diagnosis: Unsteadiness on feet (R26.81);Other abnormalities of gait and mobility (R26.89);Muscle weakness (generalized) (M62.81)     Time: 4403-4742 PT Time Calculation (min) (ACUTE ONLY): 16 min  Charges:  $Therapeutic Activity: 8-22 mins                     Darbie Biancardi P., PTA Acute Rehabilitation Services Pager: [phone] Office: [phone]    [place]us [place]ms 08/12/2020, 5:45 PM

## 2020-08-12 NOTE — Telephone Encounter (Signed)
I got a call from Dr. Shelva Majestic from [facility] wanting to set up an appointment for the patient to get a loop recorder put in. I told him I will send a message to [facility] to schedule the patient.

## 2020-08-12 NOTE — Discharge Summary (Signed)
Physician Discharge Summary  Cordie Beazley KDP:[id number] DOB: March 26, 1940 DOA: 08/10/2020  PCP: Biagio Borg, MD  Admit date: 08/10/2020 Discharge date: 08/12/2020  Time spent: 35 minutes  Recommendations for Outpatient Follow-up:   Covid vaccination; vaccinated 3/4  Acute CVA: -Small acute infarcts in the high right posterior frontal white matter, left caudate, right thalamus, and bilateral cerebellar hemispheres A shower of strokes point to an embolic etiology either from the calcification of the aortic valve or clots from atrial fibrillation. Will continue with stroke workup. Carotid Dopplers: 1-39% stenosis in the carotids bilaterally, normal flow in the vertebrals and subclavians 2D Echo: Severe aortic valve stenosis due to calcification, LVEF 55-60%, mild LA dilatation Loop recorder LDL 53, under goal of 70 HgbA1c 7.4, over goal of 7.0 and elevated from four months ago (7.2) Continue lipitor 18m and .  Consider Eliquis given new onset A. fib and by cerebral embolic strokes Ongoing aggressive stroke risk factor management Per stroke team patient requires sleep study to evaluate for sleep apnea upon discharge.  Patient has appointment with [facility] on 18 August at 0900 Per stroke team patient requires loop recorder.  [facility] and [facility] for her to be seen and have loop recorder placed.     DM type II uncontrolled with hyperglycemia/without long-term current use of insulin ([place] - Restsrt home medications. PCP to adjust meds    Hyperlipidemia with target LDL less than 70 -Lipitor 40 mg daily    Atrial fibrillation with rapid ventricular response (HCC): -Apixaban 5 mg  BID -Coreg 6.25 mg  BID -Irbesartan 300 mg daily   Essential hypertension -Allow permissive hypertension. -Allow  BP to return to normal range over next 5-7 days   Chronic diastolic CHF (congestive heart failure) ([place] -See A. fib    Hypomagnesemia -Magnesium goal> 2   Obese (BMI 39.55 kg/m) - Would benefit from nutritional counseling long-term once discharged.  Discharge Diagnoses:  Principal Problem:   Atrial fibrillation with rapid ventricular response (HCC) Active Problems:   Type 2 diabetes mellitus with hyperglycemia, without long-term current use of insulin (HCC)   Hyperlipidemia with target LDL less than 70   Essential hypertension   Chronic diastolic CHF (congestive heart failure) (HCC)   Slurred speech   Hypomagnesemia   Atrial fibrillation with RVR ([place]   Discharge Condition: Stable  Diet recommendation: Heart healthy/carb modified  Filed Weights   08/10/20 0053 08/11/20 0426 08/12/20 0533  Weight: 95.3 kg 94.9 kg 94.9 kg    History of present illness:  JHayleen Clinkscalesis an 80y.o. WF PMHx iron deficiency anemia chronic diastolic heart failure, DM Type 2    Comes into the emergency room for palpitations chest pressure and slurred speech upon evaluation in the ED he was found to be in A. fib with RVR due to patient waxing and waning neurological symptoms neurology was consulted as an drip CT of the head showed no acute intracranial abnormalities.  MRI of the brain showed small acute infarct diffusion bilaterally  Hospital Course:  See above   Consultations: Neurology stroke team      Discharge Exam: Vitals:   08/11/20 1928 08/12/20 0001 08/12/20 0533 08/12/20 0850  BP: (!) 156/54 (!) 130/49 (!) 152/55 (!) 157/46  Pulse: 64 65 63 67  Resp: _0 Temp: 97.7 F (36.5 C) 97.7 F (36.5 C) 98.1 F (36.7 C) 97.7 F (36.5 C)  TempSrc: Oral Oral Oral Oral  SpO2: 99% 95% 94% 98%  Weight:   94.9 kg   Height:        General: A/O x4, No acute respiratory distress Eyes: negative scleral hemorrhage, negative anisocoria, negative icterus ENT: Negative Runny nose, negative gingival bleeding, Neck:  Negative scars, masses, torticollis, lymphadenopathy, JVD Lungs: Clear to  auscultation bilaterally without wheezes or crackles Cardiovascular: Regular rate and rhythm without murmur gallop or rub normal S1 and S2 Abdomen: negative abdominal pain, nondistended, positive soft, bowel sounds, no rebound, no ascites, no appreciable mass Extremities: No significant cyanosis, clubbing, or edema bilateral lower extremities Skin: Negative rashes, lesions, ulcers Psychiatric:  Negative depression, negative anxiety, negative fatigue, negative mania Central nervous system:  Cranial nerves II through XII intact, tongue/uvula midline, all extremities muscle strength 5/5, sensation intact throughout, finger nose finger bilateral within normal limits, quick finger touch bilateral within normal limits, heel to shin bilateral within normal limits, negative dysarthria, negative expressive aphasia, negative receptive aphasia.  Slight left facial droop.    Discharge Instructions  Discharge Instructions     Amb referral to [facility]   Complete by: As directed       Allergies as of 08/12/2020       Reactions   Ibuprofen Other (See Comments)   Bleeding events        Medication List     STOP taking these medications    chlorthalidone 25 MG tablet Commonly known as: HYGROTON   telmisartan 80 MG tablet Commonly known as: MICARDIS Replaced by: irbesartan 300 MG tablet       TAKE these medications    acetaminophen 500 MG tablet Commonly known as: TYLENOL Take 500 mg by mouth every 6 (six) hours as needed for moderate pain.   apixaban 5 MG Tabs tablet Commonly known as: ELIQUIS Take 1 tablet (5 mg total) by mouth 2 (two) times daily.   aspirin EC 81 MG tablet Take 81 mg by mouth. Take 81 mg tablet  On Monday , Wednesday , Friday and Saturday   atorvastatin 40 MG tablet Commonly known as: LIPITOR Take 1 tablet (40 mg total) by mouth daily.   blood glucose meter kit and supplies Dispense based on patient and insurance preference. Use up to four times daily as  directed. E11.9   clonazePAM 0.5 MG tablet Commonly known as: KLONOPIN TAKE 1 TABLET BY MOUTH TWICE A DAY AS NEEDED FOR ANXIETY What changed:  how much to take how to take this when to take this reasons to take this   cyanocobalamin 1000 MCG tablet Commonly known as: CVS VITAMIN B12 Take 1 tablet (1,000 mcg total) by mouth daily.   glimepiride 2 MG tablet Commonly known as: AMARYL Take 1 tablet (2 mg total) by mouth daily with breakfast.   irbesartan 300 MG tablet Commonly known as: AVAPRO Take 1 tablet (300 mg total) by mouth daily. Start taking on: August 13, 2020 Replaces: telmisartan 80 MG tablet   metFORMIN 1000 MG tablet Commonly known as: GLUCOPHAGE TAKE 1 TAB BY MOUTH IN THE AM,AND 1/2 TAB IN THE PM What changed:  how much to take how to take this when to take this   ONE TOUCH ULTRA 2 w/Device Kit Use as directed daily   OneTouch Delica Plus [id number] Misc USE AS DIRECTED TEST 1-2 TIMES A DAY   OneTouch Ultra test strip Generic drug: glucose blood USE AS INSTRUCTED TEST 1-2 TIMES A DAY   pantoprazole 40 MG tablet Commonly known as: PROTONIX Take 1 tablet (40  mg total) by mouth daily.       ASK your doctor about these medications    carvedilol 6.25 MG tablet Commonly known as: COREG TAKE 1 TABLET BY MOUTH TWICE A DAY       Allergies  Allergen Reactions   Ibuprofen Other (See Comments)    Bleeding events    Follow-up Information     [facility]. Schedule an appointment as soon as possible for a visit.   Why: GO: Aug. 18 [facility] information: [address]     [place] [address] [phone]        Biagio Borg, MD Follow up.   Specialties: Internal Medicine, Radiology Why: Follow-up appointment on 08/19/20 at 2:40pm Contact information: [place] [address] [phone]         [facility] Follow up.   Specialty: Home Health Services Why:  HHPT Contact information: [address] [place] [address] [phone]                  The results of significant diagnostics from this hospitalization (including imaging, microbiology, ancillary and laboratory) are listed below for reference.    Significant Diagnostic Studies: CT HEAD WO CONTRAST  Result Date: 08/10/2020 CLINICAL DATA:  TIA EXAM: CT HEAD WITHOUT CONTRAST TECHNIQUE: Contiguous axial images were obtained from the base of the skull through the vertex without intravenous contrast. COMPARISON:  None. FINDINGS: Brain: No evidence of acute infarction, hemorrhage, hydrocephalus, extra-axial collection or mass lesion/mass effect. Subcortical white matter and periventricular small vessel ischemic changes. Vascular: Intracranial atherosclerosis. Skull: Normal. Negative for fracture or focal lesion. Sinuses/Orbits: The visualized paranasal sinuses are essentially clear. The mastoid air cells are unopacified. Other: None. IMPRESSION: No evidence of acute intracranial abnormality. Small vessel ischemic changes. Electronically Signed   By: Julian Hy M.D.   On: 08/10/2020 01:54   MR ANGIO HEAD WO CONTRAST  Result Date: 08/10/2020 CLINICAL DATA:  Transient ischemic attack. EXAM: MRI HEAD WITHOUT CONTRAST MRA HEAD WITHOUT CONTRAST TECHNIQUE: Multiplanar, multi-echo pulse sequences of the brain and surrounding structures were acquired without intravenous contrast. Angiographic images of the Circle of Willis were acquired using MRA technique without intravenous contrast. COMPARISON:  No pertinent prior exam. FINDINGS: MRI HEAD FINDINGS Brain: Small acute infarcts in the high right posterior frontal white matter, left caudate, right thalamus, and bilateral cerebellar hemispheres. No substantial edema or mass effect. Severe patchy fluid T2/FLAIR hyperintensities within the white matter, nonspecific but most likely related to chronic microvascular ischemic disease. Remote  lacunar infarcts involving bilateral cerebellar hemispheres, right thalamus, and left corona radiata. Mild generalized atrophy with ex vacuo ventricular dilation. No hydrocephalus, acute hemorrhage, mass lesion, midline shift, or extra-axial fluid collection. Vascular: See below. Skull and upper cervical spine: Normal marrow signal. Sinuses/Orbits: Mild mucosal thickening of scattered ethmoid air cells. Other: No mastoid effusions. MRA HEAD FINDINGS Anterior circulation: Bilateral ICAs, MCAs, and ACAs are patent without flow-limiting proximal stenosis. No aneurysm identified. Posterior circulation: Bilateral visualized intradural vertebral arteries, basilar artery, and posterior cerebral arteries are patent without flow-limiting proximal stenosis. Right larger than left posterior communicating arteries. No aneurysm identified. Anatomic variants: See above. IMPRESSION: MRI: 1. Small acute infarcts in the high right posterior frontal white matter, left caudate, right thalamus, and bilateral cerebellar hemispheres. No substantial edema or mass effect. Given involvement of multiple vascular territories, consider a central embolic etiology. 2. Severe chronic microvascular ischemic disease and remote lacunar infarcts.  MRA: No large vessel occlusion or proximal flow limiting stenosis. Electronically Signed   By: Margaretha Sheffield MD   On: 08/10/2020 09:57   MR BRAIN WO CONTRAST  Result Date: 08/10/2020 CLINICAL DATA:  Transient ischemic attack. EXAM: MRI HEAD WITHOUT CONTRAST MRA HEAD WITHOUT CONTRAST TECHNIQUE: Multiplanar, multi-echo pulse sequences of the brain and surrounding structures were acquired without intravenous contrast. Angiographic images of the Circle of Willis were acquired using MRA technique without intravenous contrast. COMPARISON:  No pertinent prior exam. FINDINGS: MRI HEAD FINDINGS Brain: Small acute infarcts in the high right posterior frontal white matter, left caudate, right thalamus, and  bilateral cerebellar hemispheres. No substantial edema or mass effect. Severe patchy fluid T2/FLAIR hyperintensities within the white matter, nonspecific but most likely related to chronic microvascular ischemic disease. Remote lacunar infarcts involving bilateral cerebellar hemispheres, right thalamus, and left corona radiata. Mild generalized atrophy with ex vacuo ventricular dilation. No hydrocephalus, acute hemorrhage, mass lesion, midline shift, or extra-axial fluid collection. Vascular: See below. Skull and upper cervical spine: Normal marrow signal. Sinuses/Orbits: Mild mucosal thickening of scattered ethmoid air cells. Other: No mastoid effusions. MRA HEAD FINDINGS Anterior circulation: Bilateral ICAs, MCAs, and ACAs are patent without flow-limiting proximal stenosis. No aneurysm identified. Posterior circulation: Bilateral visualized intradural vertebral arteries, basilar artery, and posterior cerebral arteries are patent without flow-limiting proximal stenosis. Right larger than left posterior communicating arteries. No aneurysm identified. Anatomic variants: See above. IMPRESSION: MRI: 1. Small acute infarcts in the high right posterior frontal white matter, left caudate, right thalamus, and bilateral cerebellar hemispheres. No substantial edema or mass effect. Given involvement of multiple vascular territories, consider a central embolic etiology. 2. Severe chronic microvascular ischemic disease and remote lacunar infarcts. MRA: No large vessel occlusion or proximal flow limiting stenosis. Electronically Signed   By: Margaretha Sheffield MD   On: 08/10/2020 09:57   DG Chest Port 1 View  Result Date: 08/10/2020 CLINICAL DATA:  Atrial fibrillation EXAM: PORTABLE CHEST 1 VIEW COMPARISON:  03/14/2018 FINDINGS: The heart size and mediastinal contours are within normal limits. Both lungs are clear. The visualized skeletal structures are unremarkable. IMPRESSION: No active disease. Electronically Signed   By:  Ulyses Jarred M.D.   On: 08/10/2020 01:23   ECHOCARDIOGRAM COMPLETE  Result Date: 08/10/2020    ECHOCARDIOGRAM REPORT   Patient Name:   DREAM HARMAN Date of Exam: 08/10/2020 Medical Rec #:  [id number]      Height:       61.0 in Accession #:    [id number]     Weight:       210.0 lb Date of Birth:  April 03, 1940      BSA:          1.928 m Patient Age:    29 years       BP:           156/56 mmHg Patient Gender: F              HR:           49 bpm. Exam Location:  Inpatient Procedure: 2D Echo, Cardiac Doppler and Color Doppler                     STAT ECHO Reported to: Chandrasekhar on 08/10/2020 8:38:00 AM. Indications:    TIA  History:        Patient has prior history of Echocardiogram examinations, most  recent 10/01/2019. CHF, CAD and Previous Myocardial Infarction,                 Arrythmias:Atrial Fibrillation, Signs/Symptoms:Murmur; Risk                 Factors:Dyslipidemia, Hypertension and Diabetes. 05/21/2015 cath.  Sonographer:    Luisa Hart RDCS Referring Phys: [id number] Vernelle Emerald  Sonographer Comments: Patient is morbidly obese. Image acquisition challenging due to patient body habitus. IMPRESSIONS  1. The aortic valve is calcified. Aortic valve regurgitation is not visualized. Severe aortic valve stenosis. Aortic valve area, by VTI measures 0.86 cm. Aortic valve mean gradient measures 42.0 mmHg. Aortic valve Vmax measures 4.36 m/s.  2. Left ventricular ejection fraction, by estimation, is 55 to 60%. The left ventricle has normal function. The left ventricle has no regional wall motion abnormalities. There is severe concentric left ventricular hypertrophy. Left ventricular diastolic  parameters are indeterminate.  3. Right ventricular systolic function is normal. The right ventricular size is normal. There is moderately elevated pulmonary artery systolic pressure.  4. Left atrial size was mildly dilated.  5. The pericardial effusion is circumferential.  6. The mitral valve is abnormal.  Trivial mitral valve regurgitation. The mean mitral valve gradient is 2.0 mmHg with average heart rate of 53 bpm. Severe mitral annular calcification. Comparison(s): A prior study was performed on 10/01/2019. Prior images reviewed side by side. Further increase in aortic valve gradients. Conclusion(s)/Recommendation(s): If urgent to emergent non-cardiac surgery is considered, care to manage hypotension should be considered in the setting of severe aortic stenosis. FINDINGS  Left Ventricle: NO LVOTO. Left ventricular ejection fraction, by estimation, is 55 to 60%. The left ventricle has normal function. The left ventricle has no regional wall motion abnormalities. The left ventricular internal cavity size was normal in size. There is severe concentric left ventricular hypertrophy. Left ventricular diastolic parameters are indeterminate. Right Ventricle: The right ventricular size is normal. No increase in right ventricular wall thickness. Right ventricular systolic function is normal. There is moderately elevated pulmonary artery systolic pressure. The tricuspid regurgitant velocity is 3.72 m/s, and with an assumed right atrial pressure of 3 mmHg, the estimated right ventricular systolic pressure is 14.6 mmHg. Left Atrium: Left atrial size was mildly dilated. Right Atrium: Right atrial size was normal in size. Pericardium: Trivial pericardial effusion is present. The pericardial effusion is circumferential. Mitral Valve: The mitral valve is abnormal. Severe mitral annular calcification. Trivial mitral valve regurgitation. MV peak gradient, 7.0 mmHg. The mean mitral valve gradient is 2.0 mmHg with average heart rate of 53 bpm. Tricuspid Valve: The tricuspid valve is normal in structure. Tricuspid valve regurgitation is mild . No evidence of tricuspid stenosis. Aortic Valve: The aortic valve is calcified. Aortic valve regurgitation is not visualized. Severe aortic stenosis is present. Aortic valve mean gradient measures  42.0 mmHg. Aortic valve peak gradient measures 76.0 mmHg. Aortic valve area, by VTI measures  0.86 cm. Pulmonic Valve: The pulmonic valve was grossly normal. Pulmonic valve regurgitation is not visualized. No evidence of pulmonic stenosis. Aorta: The aortic root is normal in size and structure. IAS/Shunts: The atrial septum is grossly normal.  LEFT VENTRICLE PLAX 2D LVIDd:         4.00 cm     Diastology LVIDs:         2.80 cm     LV e' medial:    6.20 cm/s LV PW:         1.70 cm  LV E/e' medial:  19.7 LV IVS:        1.50 cm     LV e' lateral:   5.33 cm/s LVOT diam:     2.00 cm     LV E/e' lateral: 22.9 LV SV:         81 LV SV Index:   42 LVOT Area:     3.14 cm  LV Volumes (MOD) LV vol d, MOD A4C: 63.5 ml LV vol s, MOD A4C: 30.4 ml LV SV MOD A4C:     63.5 ml RIGHT VENTRICLE RV S prime:     15.40 cm/s TAPSE (M-mode): 2.9 cm LEFT ATRIUM             Index       RIGHT ATRIUM           Index LA diam:        5.00 cm 2.59 cm/m  RA Area:     20.50 cm LA Vol (A2C):   59.5 ml 30.86 ml/m RA Volume:   62.40 ml  32.36 ml/m LA Vol (A4C):   76.6 ml 39.72 ml/m LA Biplane Vol: 69.1 ml 35.83 ml/m  AORTIC VALVE                    PULMONIC VALVE AV Area (Vmax):    0.73 cm     PV Vmax:       1.62 m/s AV Area (Vmean):   0.75 cm     PV Vmean:      135.000 cm/s AV Area (VTI):     0.86 cm     PV VTI:        0.496 m AV Vmax:           436.00 cm/s  PV Peak grad:  10.5 mmHg AV Vmean:          273.750 cm/s PV Mean grad:  9.0 mmHg AV VTI:            0.950 m AV Peak Grad:      76.0 mmHg AV Mean Grad:      42.0 mmHg LVOT Vmax:         102.00 cm/s LVOT Vmean:        65.400 cm/s LVOT VTI:          0.259 m LVOT/AV VTI ratio: 0.27  AORTA Ao Root diam: 2.70 cm Ao Asc diam:  2.80 cm MITRAL VALVE                 TRICUSPID VALVE MV Area (PHT): 3.48 cm      TR Peak grad:   55.4 mmHg MV Area VTI:   1.76 cm      TR Vmax:        372.00 cm/s MV Peak grad:  7.0 mmHg MV Mean grad:  2.0 mmHg      SHUNTS MV Vmax:       1.32 m/s      Systemic VTI:   0.26 m MV Vmean:      67.9 cm/s     Systemic Diam: 2.00 cm MV Decel Time: 218 msec MR Peak grad:    63.0 mmHg MR Mean grad:    47.0 mmHg MR Vmax:         397.00 cm/s MR Vmean:        333.0 cm/s MR PISA:         0.57 cm MR PISA Eff ROA: 3 mm MR PISA Radius:  0.30 cm MV E velocity: 122.00 cm/s MV A velocity: 74.60 cm/s MV E/A ratio:  1.64 Rudean Haskell MD Electronically signed by Rudean Haskell MD Signature Date/Time: 08/10/2020/8:56:46 AM    Final    VAS US CAROTID (at [facility] and [facility] only)  Result Date: 08/10/2020 Carotid Arterial Duplex Study Patient Name:  ROSHANDA BALAZS  Date of Exam:   08/10/2020 Medical Rec #: [id number]       Accession #:    [id number] Date of Birth: Jun 08, 1940       Patient Gender: F Patient Age:   79Y Exam Location:  [facility] Procedure:      VAS US CAROTID Referring Phys: [id number] [place] --------------------------------------------------------------------------------  Indications:       CVA. Risk Factors:      Hypertension, hyperlipidemia, Diabetes. Comparison Study:  MRA head 08-10-2020, no prior carotid duplex studies. Performing Technologist: Darlin Coco RDMS,RVT  Examination Guidelines: A complete evaluation includes B-mode imaging, spectral Doppler, color Doppler, and power Doppler as needed of all accessible portions of each vessel. Bilateral testing is considered an integral part of a complete examination. Limited examinations for reoccurring indications may be performed as noted.  Right Carotid Findings: +----------+--------+--------+--------+--------------------------+--------+           PSV cm/sEDV cm/sStenosisPlaque Description        Comments +----------+--------+--------+--------+--------------------------+--------+ CCA Prox  79      9                                                  +----------+--------+--------+--------+--------------------------+--------+ CCA Distal72      14                                                  +----------+--------+--------+--------+--------------------------+--------+ ICA Prox  63      15      1-39%   mild heterogenous                  +----------+--------+--------+--------+--------------------------+--------+ ICA Distal65      16                                        tortuous +----------+--------+--------+--------+--------------------------+--------+ ECA       57                      calcific, smooth and focal         +----------+--------+--------+--------+--------------------------+--------+ +----------+--------+-------+----------------+-------------------+           PSV cm/sEDV cmsDescribe        Arm Pressure (mmHG) +----------+--------+-------+----------------+-------------------+ [id number]             Multiphasic, WNL                    +----------+--------+-------+----------------+-------------------+ +---------+--------+--+--------+--+---------+ VertebralPSV cm/s44EDV cm/s10Antegrade +---------+--------+--+--------+--+---------+  Left Carotid Findings: +----------+--------+--------+--------+------------------+--------+           PSV cm/sEDV cm/sStenosisPlaque DescriptionComments +----------+--------+--------+--------+------------------+--------+ CCA Prox  67      11                                         +----------+--------+--------+--------+------------------+--------+  CCA Distal65      18                                         +----------+--------+--------+--------+------------------+--------+ ICA Prox  70      15      1-39%   calcific                   +----------+--------+--------+--------+------------------+--------+ ICA Distal97      24                                         +----------+--------+--------+--------+------------------+--------+ ECA       86                                                 +----------+--------+--------+--------+------------------+--------+  +----------+--------+--------+----------------+-------------------+           PSV cm/sEDV cm/sDescribe        Arm Pressure (mmHG) +----------+--------+--------+----------------+-------------------+ [id number]             Multiphasic, WNL                    +----------+--------+--------+----------------+-------------------+ +---------+--------+--+--------+-+---------+ VertebralPSV cm/s39EDV cm/s9Antegrade +---------+--------+--+--------+-+---------+   Summary: Right Carotid: Velocities in the right ICA are consistent with a 1-39% stenosis. Left Carotid: Velocities in the left ICA are consistent with a 1-39% stenosis. Vertebrals:  Bilateral vertebral arteries demonstrate antegrade flow. Subclavians: Normal flow hemodynamics were seen in bilateral subclavian              arteries. *See table(s) above for measurements and observations.  Electronically signed by Antony Contras MD on 08/10/2020 at 2:32:30 PM.    Final     Microbiology: Recent Results (from the past 240 hour(s))  Resp Panel by RT-PCR (Flu A&B, Covid) Nasopharyngeal Swab     Status: None   Collection Time: 08/10/20  1:12 AM   Specimen: Nasopharyngeal Swab; Nasopharyngeal(NP) swabs in vial transport medium  Result Value Ref Range Status   SARS Coronavirus 2 by RT PCR NEGATIVE NEGATIVE Final    Comment: (NOTE) SARS-CoV-2 target nucleic acids are NOT DETECTED.  The SARS-CoV-2 RNA is generally detectable in upper respiratory specimens during the acute phase of infection. The lowest concentration of SARS-CoV-2 viral copies this assay can detect is 138 copies/mL. A negative result does not preclude SARS-Cov-2 infection and should not be used as the sole basis for treatment or other patient management decisions. A negative result may occur with  improper specimen collection/handling, submission of specimen other than nasopharyngeal swab, presence of viral mutation(s) within the areas targeted by this assay, and inadequate  number of viral copies(<138 copies/mL). A negative result must be combined with clinical observations, patient history, and epidemiological information. The expected result is Negative.  Fact Sheet for Patients:  EntrepreneurPulse.com.au  Fact Sheet for Healthcare Providers:  IncredibleEmployment.be  This test is no t yet approved or cleared by the [place] FDA and  has been authorized for detection and/or diagnosis of SARS-CoV-2 by FDA under an Emergency Use Authorization (EUA). This EUA will remain  in effect (meaning this test can be used) for the duration of the COVID-19 declaration under Section 564(b)(1)  of the Act, 21 U.S.C.section 360bbb-3(b)(1), unless the authorization is terminated  or revoked sooner.       Influenza A by PCR NEGATIVE NEGATIVE Final   Influenza B by PCR NEGATIVE NEGATIVE Final    Comment: (NOTE) The Xpert Xpress SARS-CoV-2/FLU/RSV plus assay is intended as an aid in the diagnosis of influenza from Nasopharyngeal swab specimens and should not be used as a sole basis for treatment. Nasal washings and aspirates are unacceptable for Xpert Xpress SARS-CoV-2/FLU/RSV testing.  Fact Sheet for Patients: EntrepreneurPulse.com.au  Fact Sheet for Healthcare Providers: IncredibleEmployment.be  This test is not yet approved or cleared by the [place] FDA and has been authorized for detection and/or diagnosis of SARS-CoV-2 by FDA under an Emergency Use Authorization (EUA). This EUA will remain in effect (meaning this test can be used) for the duration of the COVID-19 declaration under Section 564(b)(1) of the Act, 21 U.S.C. section 360bbb-3(b)(1), unless the authorization is terminated or revoked.  Performed at [facility], [place] [address], [place], [place] [address]      Labs: Basic Metabolic Panel: Recent Labs  Lab 08/10/20 0108 08/10/20 0113 08/11/20 0840  08/12/20 0234  NA 138 136 138 138  K 3.9 3.9 3.8 3.8  CL 105 105 103 104  CO2  --  _0 GLUCOSE 278* 276* 172* 117*  BUN 28* 28* 17 22  CREATININE 1.00 1.21* 1.10* 1.15*  CALCIUM  --  9.9 10.1 9.7  MG  --  1.5* 1.8 1.7  PHOS  --   --  3.0 3.6   Liver Function Tests: Recent Labs  Lab 08/10/20 0113 08/11/20 0840 08/12/20 0234  AST _1 ALT _2 ALKPHOS 42 41 39  BILITOT 1.2 2.2* 1.8*  PROT 6.4* 6.6 6.2*  ALBUMIN 3.5 3.5 3.1*   No results for input(s): LIPASE, AMYLASE in the last 168 hours. No results for input(s): AMMONIA in the last 168 hours. CBC: Recent Labs  Lab 08/10/20 0108 08/10/20 0113 08/11/20 0312 08/12/20 0234  WBC  --  7.9 5.3 5.3  NEUTROABS  --  5.4  --  2.8  HGB 12.2 11.9* 11.4* 11.2*  HCT 36.0 35.7* 34.3* 33.0*  MCV  --  97.5 96.3 96.2  PLT  --  229 187 178   Cardiac Enzymes: No results for input(s): CKTOTAL, CKMB, CKMBINDEX, TROPONINI in the last 168 hours. BNP: BNP (last 3 results) No results for input(s): BNP in the last 8760 hours.  ProBNP (last 3 results) No results for input(s): PROBNP in the last 8760 hours.  CBG: Recent Labs  Lab 08/11/20 1120 08/11/20 1542 08/11/20 2054 08/12/20 0559 08/12/20 1143  GLUCAP 166* 152* 133* 131* 145*       Signed:  Dia Crawford, MD [facility]

## 2020-08-12 NOTE — Evaluation (Signed)
Speech Language Pathology Evaluation Patient Details Name: Mary Mccarthy MRN: [id number] DOB: [birth year] Today's Date: 08/12/2020 Time: 8101-7510 SLP Time Calculation (min) (ACUTE ONLY): 28 min  Problem List:  Patient Active Problem List   Diagnosis Date Noted   Atrial fibrillation with RVR (HCC) 08/11/2020   Atrial fibrillation with rapid ventricular response (HCC) 08/10/2020   Slurred speech 08/10/2020   Hypomagnesemia 08/10/2020   Aortic atherosclerosis (HCC) 03/18/2020   Chest pain at rest 09/22/2019   Vitamin D deficiency 09/21/2019   B12 deficiency 09/21/2019   COVID-19 virus vaccination not done 03/06/2019   Cough 03/14/2018   Wheezing 03/14/2018   Anxiety 09/11/2017   Moderate to severe aortic stenosis 03/14/2017   Osteoporosis 03/10/2016   Coronary artery disease, non-occlusive 05/24/2015   Chronic diastolic CHF (congestive heart failure) (HCC)    Pulmonary hypertension (HCC)    Chronic diastolic heart failure (HCC) 05/20/2015   Gastric ulceration    AVM (arteriovenous malformation) of colon    Demand ischemia of myocardium (HCC)    Viral illness 11/20/2014   Right knee pain 05/21/2014   Anemia, iron deficiency 05/20/2013   Encounter for well adult exam with abnormal findings 06/19/2010   MENOPAUSAL DISORDER 12/07/2009   INSOMNIA 09/24/2008   ALLERGIC RHINITIS 06/24/2007   GERD 06/24/2007   COLONIC POLYPS, HX OF 06/24/2007   Hyperlipidemia with target LDL less than 70 10/08/2006   Depression 10/08/2006   Essential hypertension 10/08/2006   PEPTIC ULCER DISEASE 10/08/2006   Type 2 diabetes mellitus with hyperglycemia, without long-term current use of insulin (HCC) 10/05/2006   Morbid obesity (HCC) 10/05/2006   Past Medical History:  Past Medical History:  Diagnosis Date   ALLERGIC RHINITIS 06/24/2007   Anemia    AVM (arteriovenous malformation) of colon    Blood transfusion without reported diagnosis 05/2015   COLONIC POLYPS, HX OF 06/24/2007   Coronary  artery disease, non-occlusive 05/2015   Trop + w/ Acute Anemia =>CATH: small RI - Ostial 60%, ostial RCA 30% and dLAD 40-50%;; 9/'21: Cor Ca Score 624. Mild (25-49%) prox RCA & LAD,; Moderate (50-69%) Ostial Small RI & prox LCx.     DEPRESSION 10/08/2006   DIABETES MELLITUS, TYPE II 10/05/2006   GERD 06/24/2007   HYPERLIPIDEMIA 10/08/2006   HYPERTENSION 10/08/2006   INSOMNIA 09/24/2008   Left knee DJD    Moderate to severe aortic stenosis 05/2015   a) Mod AS (mean gras 22 mmHg, peak 42 mmHg -AVA 1.1-1.2 cm.);b) 04/2017: Severe Ca2+ AoV w/ Mod-Severe AS (p-m grad 60-34 mmHg -Moderate), calculated AVA 0.8 cm -Severe;;c) 03/2019: Mod AS (m grad 31 mmHg); d) 09/2019: Mod AS (m grad 34.5 mmHg).   Myocardial infarction (HCC) 05/2015   Type 2 MI - related to GI Bleed / Anemia (non-obstructive CAD by Cath)   Osteoporosis 03/10/2016   PEPTIC ULCER DISEASE 10/08/2006   Past Surgical History:  Past Surgical History:  Procedure Laterality Date   APPENDECTOMY     BREAST BIOPSY     CARDIAC CATHETERIZATION N/A 05/21/2015   Procedure: Left Heart Cath and Coronary Angiography;  Surgeon: [name], MD;  Location: [facility] INVASIVE CV LAB;  Service: Cardiovascular;: Ost RI 60%, Ost RCA 30%, dLAD tapers to small vessel w/ 40-50%. Mildly elevated LVEDP. Normal LV Fxn.   COLONOSCOPY N/A 05/20/2015   Procedure: COLONOSCOPY;  Surgeon: Ruffin Frederick, MD;  Location: [facility] ENDOSCOPY;  Service: Gastroenterology;  Laterality: N/A;   CORONARY CA2+ SCORE / CARDIAC CT ANGIOGRAM  10/09/2019   Calcium score 624.  82nd percentile. Dominant RCA: Mild (25-49%) proximal stenosis-distal bifurcation into PDA and PAV--< RPL branches.  LAD (1 major mid vessel diagonal) diffuse calcified plaque, mild proximal stenosis with minimal distal stenosis.  Small RI moderate ostial disease.  LCx-moderate mixed (50-69%) proximal stenosis.  Small dOM1 disease.  Trileaflet AoV, annular Ca2+ - probable AS   ESOPHAGOGASTRODUODENOSCOPY N/A 05/20/2015    Procedure: ESOPHAGOGASTRODUODENOSCOPY (EGD);  Surgeon: Ruffin Frederick, MD;  Location: [facility] ENDOSCOPY;  Service: Gastroenterology;  Laterality: N/A;   TONSILLECTOMY     TRANSTHORACIC ECHOCARDIOGRAM  03/2019; 09/2019   a) EF 60 to 65%.  Moderate LVH.  GRII DD.  Mod-Severe AS (m grad 36 mmHg, peak 59 mmHg); b) EF 65-70%, No RWMA. Gr1 DD/hi LAP, Mild hi PAP. Mod LA Dil. MOD AS (mean Grad 34.5 mmHg).  STABLE    TRANSTHORACIC ECHOCARDIOGRAM  04/12/2017   EF 60-65%. No RWMA.  GR 1 DD.  Moderate concentric LVH.  Mild LA dilation. Severe calcified aortic valve with moderate-severe aortic stenosis (peak/mean gradients 60/34 mmHg) and in severe range by AVA (0.8 cm2).  Mild to moderately increased PA pressures 41 mmHg with normal RV size and function..     TUBAL LIGATION     HPI:  Pt is 33 female who presented with chest pressure and slurred speech on 08/10/20.  She was found to be in Afib with RVR with waxing/waning neurological symptoms.  MRI did reveal small diffuse bilateral acute cerebellar infarcts. Pt with medical history including anemia, diastolic heart failure, and DM2.   Assessment / Plan / Recommendation Clinical Impression  Pt appears to be at her cognitive and communicative baseline, with clear/fluent speech and a score of 27/30 (WNL) on the SLUMS. She had mild difficulty with the hands on the clock, but throughout this eval she also demonstrated good safety and anticipatory awareness and she discussed d/c planning. She had very good recall during the SLUMS and when discussing details from her hospital stay, also demonstrating her meticulous method for memory at home (such as how it relates to medication management). No further SLP f/u is indicated at this time - will sign off acutely.    SLP Assessment  SLP Recommendation/Assessment: Patient does not need any further Speech Lanaguage Pathology Services SLP Visit Diagnosis: Cognitive communication deficit (R41.841)    Follow Up  Recommendations  Other (comment) (intermittent supervision upon initial return home, reviewed general safety precautions with pt)    Frequency and Duration           SLP Evaluation Cognition  Overall Cognitive Status: Within Functional Limits for tasks assessed Orientation Level: Oriented X4       Comprehension  Auditory Comprehension Overall Auditory Comprehension: Impaired    Expression Expression Primary Mode of Expression: Verbal Verbal Expression Overall Verbal Expression: Appears within functional limits for tasks assessed   Oral / Motor  Motor Speech Overall Motor Speech: Appears within functional limits for tasks assessed   GO                    Mahala Menghini., M.A. CCC-SLP Acute Rehabilitation Services Pager [phone] Office [phone]  08/12/2020, 10:51 AM

## 2020-08-12 NOTE — TOC Transition Note (Signed)
Transition of Care [facility]) - CM/SW Discharge Note   Patient Details  Name: Mary Mccarthy MRN: [id number] Date of Birth: 11/29/1940  Transition of Care [facility]) CM/SW Contact:  [name], RN Phone Number: 08/12/2020, 10:08 AM   Clinical Narrative:    NCM offered choice for HHPT with Medicare.gov list, patient states she is ok with [facility].  NCM made referral to [facility] with [facility]. She is able to take referral.  Soc will begin 24 to 48 hrs post dc  she states her nephew will transport her home.  She has no issues with getting her medications or taking her medications. She has a walker and a cane at home.   Final next level of care: Home w Home Health Services Barriers to Discharge: No Barriers Identified   Patient Goals and CMS Choice Patient states their goals for this hospitalization and ongoing recovery are:: return home CMS Medicare.gov Compare Post Acute Care list provided to:: Patient Choice offered to / list presented to : Patient  Discharge Placement                       Discharge Plan and Services                  DME Agency: NA       HH Arranged: PT HH Agency: [facility] Date [facility] Agency Contacted: 08/12/20 Time HH Agency Contacted: 1008 Representative spoke with at [facility] Agency: Cyndi  Social Determinants of Health (SDOH) Interventions     Readmission Risk Interventions No flowsheet data found.

## 2020-08-13 ENCOUNTER — Telehealth: Payer: Self-pay | Admitting: Internal Medicine

## 2020-08-13 ENCOUNTER — Telehealth: Payer: Self-pay

## 2020-08-13 NOTE — Telephone Encounter (Signed)
Transition Care Management Follow-up Telephone Call Date of discharge and from where: 08/12/2020 from [facility] How have you been since you were released from the hospital? Feeling just fine; was constipated but everything is fine now. Any questions or concerns? No  Items Reviewed: Did the pt receive and understand the discharge instructions provided? Yes  Medications obtained and verified? Yes  Other? No  Any new allergies since your discharge? No  Dietary orders reviewed? Yes; low sodium heart healthy diet Do you have support at home? Yes , my son lives with me  Home Care and Equipment/Supplies: Were home health services ordered? yes If so, what is the name of the agency? [facility]  Has the agency set up a time to come to the patient's home? Yes, 08/14/2020 Were any new equipment or medical supplies ordered?  No What is the name of the medical supply agency? N/a Were you able to get the supplies/equipment? not applicable Do you have any questions related to the use of the equipment or supplies? No  Functional Questionnaire: (I = Independent and D = Dependent) ADLs: I  Bathing/Dressing- I  Meal Prep- I  Eating- I  Maintaining continence- I  Transferring/Ambulation- I, has a cane, walker and bath chair  Managing Meds- I  Follow up appointments reviewed:  PCP Hospital f/u appt confirmed? Yes  Scheduled to see [name], MD on 08/19/2020 @ 2:40 pm. Specialist [facility] f/u appt confirmed? Yes  Scheduled to see Ihor Austin, NP at [facility] on 09/23/2020 @ 9:15 am. Are transportation arrangements needed? No  If their condition worsens, is the pt aware to call PCP or go to the Emergency Dept.? Yes Was the patient provided with contact information for the PCP's office or ED? Yes Was to pt encouraged to call back with questions or concerns? Yes

## 2020-08-13 NOTE — Telephone Encounter (Signed)
Unless I am wrong, I cannot do this as I have not seen pt within 90 days

## 2020-08-13 NOTE — Telephone Encounter (Signed)
   Mary Mccarthy from [place] requesting order for nursing eval, and PT eval  Phone [phone]

## 2020-08-15 DIAGNOSIS — E119 Type 2 diabetes mellitus without complications: Secondary | ICD-10-CM | POA: Diagnosis not present

## 2020-08-15 DIAGNOSIS — I110 Hypertensive heart disease with heart failure: Secondary | ICD-10-CM | POA: Diagnosis not present

## 2020-08-15 DIAGNOSIS — I4891 Unspecified atrial fibrillation: Secondary | ICD-10-CM | POA: Diagnosis not present

## 2020-08-15 DIAGNOSIS — D509 Iron deficiency anemia, unspecified: Secondary | ICD-10-CM | POA: Diagnosis not present

## 2020-08-15 DIAGNOSIS — I5032 Chronic diastolic (congestive) heart failure: Secondary | ICD-10-CM | POA: Diagnosis not present

## 2020-08-16 ENCOUNTER — Telehealth: Payer: Self-pay | Admitting: Internal Medicine

## 2020-08-16 ENCOUNTER — Other Ambulatory Visit: Payer: Self-pay | Admitting: *Deleted

## 2020-08-16 DIAGNOSIS — I4891 Unspecified atrial fibrillation: Secondary | ICD-10-CM | POA: Diagnosis not present

## 2020-08-16 DIAGNOSIS — I5032 Chronic diastolic (congestive) heart failure: Secondary | ICD-10-CM | POA: Diagnosis not present

## 2020-08-16 DIAGNOSIS — E119 Type 2 diabetes mellitus without complications: Secondary | ICD-10-CM | POA: Diagnosis not present

## 2020-08-16 DIAGNOSIS — D509 Iron deficiency anemia, unspecified: Secondary | ICD-10-CM | POA: Diagnosis not present

## 2020-08-16 DIAGNOSIS — I110 Hypertensive heart disease with heart failure: Secondary | ICD-10-CM | POA: Diagnosis not present

## 2020-08-16 NOTE — Telephone Encounter (Signed)
Called and spoke with [place] from [facility] in regards to pt. She states that she would like to request verbal orders for physical eval. Pt has OV here 08/19/20. Quentin Angst states after pt OV can orders be given.

## 2020-08-16 NOTE — Telephone Encounter (Signed)
   Nathanial Millman called and was wanting to notify Dr. Jonny Ruiz of the plan of care. She said they will see the patient 2 times this week and then 1w3 for nursing.   Phone: [phone]- ok LVM

## 2020-08-16 NOTE — Patient Outreach (Signed)
[facility]) Care Management  08/16/2020  Mary Mccarthy April 23, 1940 [id number]   RED ON EMMI ALERT - Stroke Day # 1 Date: 7/9 Red Alert Reason: Problems setting up rehab   Outreach attempt #1, unsuccessful, HIPAA complint voice message left on listed home line.  Listed mobile line rings busy.   Plan: RN CM will send outreach letter and follow up within the next 3-4 business days.  Kemper Durie, [place], MSN [facility] Care Management  [facility] Manager [phone]

## 2020-08-17 NOTE — Telephone Encounter (Signed)
Ok sure, ok for verbal

## 2020-08-17 NOTE — Telephone Encounter (Signed)
Ok sounds good, I dont see a question or reqeust in this message

## 2020-08-18 ENCOUNTER — Other Ambulatory Visit (HOSPITAL_BASED_OUTPATIENT_CLINIC_OR_DEPARTMENT_OTHER): Payer: Self-pay

## 2020-08-18 DIAGNOSIS — E119 Type 2 diabetes mellitus without complications: Secondary | ICD-10-CM | POA: Diagnosis not present

## 2020-08-18 DIAGNOSIS — I5032 Chronic diastolic (congestive) heart failure: Secondary | ICD-10-CM | POA: Diagnosis not present

## 2020-08-18 DIAGNOSIS — I110 Hypertensive heart disease with heart failure: Secondary | ICD-10-CM | POA: Diagnosis not present

## 2020-08-18 DIAGNOSIS — D509 Iron deficiency anemia, unspecified: Secondary | ICD-10-CM | POA: Diagnosis not present

## 2020-08-18 DIAGNOSIS — I4891 Unspecified atrial fibrillation: Secondary | ICD-10-CM | POA: Diagnosis not present

## 2020-08-18 NOTE — Telephone Encounter (Signed)
Called and spoke with [place], for verbal orders.

## 2020-08-19 ENCOUNTER — Other Ambulatory Visit: Payer: Self-pay | Admitting: *Deleted

## 2020-08-19 ENCOUNTER — Encounter: Payer: Self-pay | Admitting: Internal Medicine

## 2020-08-19 ENCOUNTER — Other Ambulatory Visit: Payer: Self-pay

## 2020-08-19 ENCOUNTER — Ambulatory Visit (INDEPENDENT_AMBULATORY_CARE_PROVIDER_SITE_OTHER): Payer: Medicare Other | Admitting: Internal Medicine

## 2020-08-19 ENCOUNTER — Telehealth: Payer: Self-pay | Admitting: Cardiology

## 2020-08-19 DIAGNOSIS — E1165 Type 2 diabetes mellitus with hyperglycemia: Secondary | ICD-10-CM | POA: Diagnosis not present

## 2020-08-19 DIAGNOSIS — Z8673 Personal history of transient ischemic attack (TIA), and cerebral infarction without residual deficits: Secondary | ICD-10-CM | POA: Diagnosis not present

## 2020-08-19 DIAGNOSIS — E785 Hyperlipidemia, unspecified: Secondary | ICD-10-CM | POA: Diagnosis not present

## 2020-08-19 DIAGNOSIS — I4891 Unspecified atrial fibrillation: Secondary | ICD-10-CM | POA: Diagnosis not present

## 2020-08-19 DIAGNOSIS — I10 Essential (primary) hypertension: Secondary | ICD-10-CM

## 2020-08-19 DIAGNOSIS — I5032 Chronic diastolic (congestive) heart failure: Secondary | ICD-10-CM | POA: Diagnosis not present

## 2020-08-19 MED ORDER — APIXABAN 5 MG PO TABS: 5.0000 mg | ORAL_TABLET | Freq: Two times a day (BID) | ORAL | 5 refills | Status: DC

## 2020-08-19 NOTE — Telephone Encounter (Signed)
I attempted to contact patient to discuss further and get more information on this.  Patient did not answer, LVM to call back. Left call back number.

## 2020-08-19 NOTE — Patient Instructions (Signed)
Please continue all other medications as before, and refills have been done if requested - eliquis  Please have the pharmacy call with any other refills you may need.  Please continue your efforts at being more active, low cholesterol diabetic diet, and weight control.  Please keep your appointments with your specialists as you may have planned - PT at home, and cardiology in Oct  2022  Please make an Appointment to return in 2 months, or sooner if needed

## 2020-08-19 NOTE — Patient Outreach (Signed)
[facility]) Care Management  08/19/2020  Mary Mccarthy 27-May-1940 [id number]   RED ON EMMI ALERT - Stroke Day # 1 Date: 7/9 Red Alert Reason: Problems setting up rehab     Outreach attempt #2, unsuccessful, HIPAA complint voice message left.     Plan: RN CM will follow up within the next 3-4 business days.  Kemper Durie, [place], MSN [facility] Care Management  [facility] Manager [phone]

## 2020-08-19 NOTE — Telephone Encounter (Signed)
A call was received from Dr. Shelva Majestic at [facility] stating pt needs an appt for an ILR implant. Pt was called and said she wanted to wait until she had a follow up with Dr. Herbie Baltimore. Informed pt that a message could be sent to get his opinion if she should wait until she is seen or go ahead and schedule pt for ILR. Pt states that she would be more comfortable knowing what Dr. Herbie Baltimore says. Please advise.

## 2020-08-19 NOTE — Progress Notes (Signed)
Patient ID: Mary Mccarthy, female   DOB: 1940-07-10, 80 y.o.   MRN: [id number]

## 2020-08-20 ENCOUNTER — Other Ambulatory Visit: Payer: Self-pay | Admitting: Cardiology

## 2020-08-20 ENCOUNTER — Other Ambulatory Visit: Payer: Self-pay | Admitting: Internal Medicine

## 2020-08-20 DIAGNOSIS — E119 Type 2 diabetes mellitus without complications: Secondary | ICD-10-CM | POA: Diagnosis not present

## 2020-08-20 DIAGNOSIS — I4891 Unspecified atrial fibrillation: Secondary | ICD-10-CM | POA: Diagnosis not present

## 2020-08-20 DIAGNOSIS — D509 Iron deficiency anemia, unspecified: Secondary | ICD-10-CM | POA: Diagnosis not present

## 2020-08-20 DIAGNOSIS — E785 Hyperlipidemia, unspecified: Secondary | ICD-10-CM

## 2020-08-20 DIAGNOSIS — I5032 Chronic diastolic (congestive) heart failure: Secondary | ICD-10-CM | POA: Diagnosis not present

## 2020-08-20 DIAGNOSIS — I2510 Atherosclerotic heart disease of native coronary artery without angina pectoris: Secondary | ICD-10-CM

## 2020-08-20 DIAGNOSIS — I110 Hypertensive heart disease with heart failure: Secondary | ICD-10-CM | POA: Diagnosis not present

## 2020-08-20 NOTE — Telephone Encounter (Signed)
Patient was recently at hospital-  She states that Dr.Wood had recommended to her to have a loop recorder placed. Per his message:  Per stroke team patient requires loop recorder.  Spoke with Selena Batten at [facility] they will call patient and schedule appointment for her to be seen and have loop recorder placed.    Patient would just like to have Dr.Harding okay this, if so she can get it scheduled. We can do them here.  I will send a message to MD to advise on how he would like to proceed.  Thank you!

## 2020-08-20 NOTE — Telephone Encounter (Signed)
Please refill as per office routine med refill policy (all routine meds refilled for 3 mo or monthly per pt preference up to one year from last visit, then month to month grace period for 3 mo, then further med refills will have to be denied)  

## 2020-08-20 NOTE — Telephone Encounter (Signed)
Pt is returning a call  

## 2020-08-22 ENCOUNTER — Other Ambulatory Visit: Payer: Self-pay | Admitting: Internal Medicine

## 2020-08-22 ENCOUNTER — Encounter: Payer: Self-pay | Admitting: Internal Medicine

## 2020-08-22 NOTE — Progress Notes (Signed)
Patient ID: Mary Mccarthy, female   DOB: 31-Jul-1940, 80 y.o.   MRN: [id number]        Chief Complaint: follow up HTN, HLD and hyperglycemia, recent cva, afib, diast chf       HPI:  Mary Mccarthy is a 80 y.o. female here post hospn  July 5 - 7 with acute CVA I the setting of afib and mult CRFs.  Pt presented with palps, CP and slurred speech, found to be in Afib with RVR and abnormal neuro s/s. Seen per neurology, MRI brain showed small acute infarct diffusion bialterally.  A shower of strokes point to an embolic etiology either from the calcification of the aortic valve or clots from atrial fibrillation. During hospn: Carotid Dopplers: 1-39% stenosis in the carotids bilaterally, normal flow in the vertebrals and subclavians 2D Echo: Severe aortic valve stenosis due to calcification, LVEF 55-60%, mild LA dilatation Loop recorder LDL 53, under goal of 70 HgbA1c 7.4, over goal of 7.0 and elevated from four months ago (7.2) Continue lipitor $RemoveBeforeDE'40mg'[id number]$  and .  Consider Eliquis given new onset A. fib and by cerebral embolic strokes Ongoing aggressive stroke risk factor management Per stroke team patient requires sleep study to evaluate for sleep apnea upon discharge.  Patient has appointment with [facility] on 18 August at 0900 Per stroke team patient requires loop recorder.  Spoke with Maudie Mercury at [facility] they will call patient and schedule appointment for her to be seen and have loop recorder placed.    With respect to other, OHA continue to be on hold, CBGs at home in low 100s.  Pt tolerating lipitor 40.  Afib rate improved on coreg, also irbesartan and eliquis.  Magnesium replaced.  Volume remained stable.  Pt d/c to home with recommendation also for sleep study.  Pt has PT started at home, now has walker, no falls.  Has no other new complaints Wt Readings from Last 3 Encounters:  08/19/20 208 lb 6.4 oz (94.5 kg)  08/12/20 209 lb 4.8 oz (94.9 kg)  03/18/20 211 lb (95.7 kg)   BP Readings  from Last 3 Encounters:  08/19/20 120/74  08/12/20 (!) 157/46  03/18/20 128/72         Past Medical History:  Diagnosis Date   ALLERGIC RHINITIS 06/24/2007   Anemia    AVM (arteriovenous malformation) of colon    Blood transfusion without reported diagnosis 05/2015   COLONIC POLYPS, HX OF 06/24/2007   Coronary artery disease, non-occlusive 05/2015   Trop + w/ Acute Anemia =>CATH: small RI - Ostial 60%, ostial RCA 30% and dLAD 40-50%;; 9/'21: Cor Ca Score 624. Mild (25-49%) prox RCA & LAD,; Moderate (50-69%) Ostial Small RI & prox LCx.     DEPRESSION 10/08/2006   DIABETES MELLITUS, TYPE II 10/05/2006   GERD 06/24/2007   HYPERLIPIDEMIA 10/08/2006   HYPERTENSION 10/08/2006   INSOMNIA 09/24/2008   Left knee DJD    Moderate to severe aortic stenosis 05/2015   a) Mod AS (mean gras 22 mmHg, peak 42 mmHg -AVA 1.1-1.2 cm.);b) 04/2017: Severe Ca2+ AoV w/ Mod-Severe AS (p-m grad 60-34 mmHg -Moderate), calculated AVA 0.8 cm -Severe;;c) 03/2019: Mod AS (m grad 31 mmHg); d) 09/2019: Mod AS (m grad 34.5 mmHg).   Myocardial infarction [facility]) 05/2015   Type 2 MI - related to GI Bleed / Anemia (non-obstructive CAD by Cath)   Osteoporosis 03/10/2016   PEPTIC ULCER DISEASE 10/08/2006   Past Surgical History:  Procedure Laterality Date   APPENDECTOMY  BREAST BIOPSY     CARDIAC CATHETERIZATION N/A 05/21/2015   Procedure: Left Heart Cath and Coronary Angiography;  Surgeon: Leonie Man, MD;  Location: [place] [facility];  Service: Cardiovascular;: Ost RI 60%, Ost RCA 30%, dLAD tapers to small vessel w/ 40-50%. Mildly elevated LVEDP. Normal LV Fxn.   COLONOSCOPY N/A 05/20/2015   Procedure: COLONOSCOPY;  Surgeon: Manus Gunning, MD;  Location: [place];  Service: Gastroenterology;  Laterality: N/A;   CORONARY CA2+ SCORE / CARDIAC CT ANGIOGRAM  10/09/2019   Calcium score 624.  82nd percentile. Dominant RCA: Mild (25-49%) proximal stenosis-distal bifurcation into PDA and PAV--< RPL branches.  LAD (1 major  mid vessel diagonal) diffuse calcified plaque, mild proximal stenosis with minimal distal stenosis.  Small RI moderate ostial disease.  LCx-moderate mixed (50-69%) proximal stenosis.  Small dOM1 disease.  Trileaflet AoV, annular Ca2+ - probable AS   ESOPHAGOGASTRODUODENOSCOPY N/A 05/20/2015   Procedure: ESOPHAGOGASTRODUODENOSCOPY (EGD);  Surgeon: Manus Gunning, MD;  Location: [place];  Service: Gastroenterology;  Laterality: N/A;   TONSILLECTOMY     TRANSTHORACIC ECHOCARDIOGRAM  03/2019; 09/2019   a) EF 60 to 65%.  Moderate LVH.  GRII DD.  Mod-Severe AS (m grad 36 mmHg, peak 59 mmHg); b) EF 65-70%, No RWMA. Gr1 DD/hi LAP, Mild hi PAP. Mod LA Dil. MOD AS (mean Grad 34.5 mmHg).  STABLE    TRANSTHORACIC ECHOCARDIOGRAM  04/12/2017   EF 60-65%. No RWMA.  GR 1 DD.  Moderate concentric LVH.  Mild LA dilation. Severe calcified aortic valve with moderate-severe aortic stenosis (peak/mean gradients 60/34 mmHg) and in severe range by AVA (0.8 cm2).  Mild to moderately increased PA pressures 41 mmHg with normal RV size and function..     TUBAL LIGATION      reports that she has never smoked. She has never used smokeless tobacco. She reports that she does not drink alcohol and does not use drugs. family history includes Alcohol abuse in an other family member; Arrhythmia in her sister; Arthritis in an other family member; CAD in her sister; Cancer in her mother; Depression in an other family member; Diabetes in an other family member; Heart attack (age of onset: 26) in her sister; Heart disease in an other family member; Hyperlipidemia in an other family member; Hypertension in an other family member; Stroke in an other family member. Allergies  Allergen Reactions   Ibuprofen Other (See Comments)    Bleeding events   Current Outpatient Medications on File Prior to Visit  Medication Sig Dispense Refill   acetaminophen (TYLENOL) 500 MG tablet Take 500 mg by mouth every 6 (six) hours as needed for  moderate pain.     aspirin EC 81 MG tablet Take 81 mg by mouth. Take 81 mg tablet  On Monday , Wednesday , Friday and Saturday     blood glucose meter kit and supplies Dispense based on patient and insurance preference. Use up to four times daily as directed. E11.9 1 each 0   Blood Glucose Monitoring Suppl (ONE TOUCH ULTRA 2) w/Device KIT Use as directed daily 1 each 0   carvedilol (COREG) 6.25 MG tablet TAKE 1 TABLET BY MOUTH TWICE A DAY (Patient taking differently: Take 6.25 mg by mouth 2 (two) times daily.) 180 tablet 2   clonazePAM (KLONOPIN) 0.5 MG tablet TAKE 1 TABLET BY MOUTH TWICE A DAY AS NEEDED FOR ANXIETY 60 tablet 1   cyanocobalamin ([employer] VITAMIN B12) 1000 MCG tablet Take 1 tablet (1,000 mcg total) by  mouth daily. 90 tablet 0   glimepiride (AMARYL) 2 MG tablet Take 1 tablet (2 mg total) by mouth daily with breakfast. 30 tablet 0   irbesartan (AVAPRO) 300 MG tablet Take 1 tablet (300 mg total) by mouth daily. 30 tablet 0   Lancets (ONETOUCH DELICA PLUS [id number]) MISC USE AS DIRECTED TEST 1-2 TIMES A DAY 100 each 23   metFORMIN (GLUCOPHAGE) 1000 MG tablet TAKE 1 TAB BY MOUTH IN THE AM,AND 1/2 TAB IN THE PM 135 tablet 0   ONETOUCH ULTRA test strip USE AS INSTRUCTED TEST 1-2 TIMES A DAY 100 strip 23   pantoprazole (PROTONIX) 40 MG tablet Take 1 tablet (40 mg total) by mouth daily. 90 tablet 0   PFIZER-BIONT COVID-19 VAC-TRIS SUSP injection      No current facility-administered medications on file prior to visit.        ROS:  All others reviewed and negative.  Objective        PE:  BP 120/74 (BP Location: Left Arm, Patient Position: Sitting, Cuff Size: Large)   Pulse 66   Temp 98.2 F (36.8 C) (Oral)   Ht $R'5\' 1"'HH$  (1.549 m)   Wt 208 lb 6.4 oz (94.5 kg)   SpO2 99%   BMI 39.38 kg/m                 Constitutional: Pt appears in NAD               HENT: Head: NCAT.                Right Ear: External ear normal.                 Left Ear: External ear normal.                Eyes: .  Pupils are equal, round, and reactive to light. Conjunctivae and EOM are normal               Nose: without d/c or deformity               Neck: Neck supple. Gross normal ROM               Cardiovascular: Normal rate and irregular rhythm.                 Pulmonary/Chest: Effort normal and breath sounds without rales or wheezing.                Abd:  Soft, NT, ND, + BS, no organomegaly               Neurological: Pt is alert. At baseline orientation, motor grossly intact               Skin: Skin is warm. No rashes, no other new lesions, LE edema - none               Psychiatric: Pt behavior is normal without agitation   Micro: none  Cardiac tracings I have personally interpreted today:  none  Pertinent Radiological findings (summarize): none   Lab Results  Component Value Date   WBC 5.3 08/12/2020   HGB 11.2 (L) 08/12/2020   HCT 33.0 (L) 08/12/2020   PLT 178 08/12/2020   GLUCOSE 117 (H) 08/12/2020   CHOL 97 08/11/2020   TRIG 86 08/11/2020   HDL 32 (L) 08/11/2020   LDLCALC 48 08/11/2020   ALT 16 08/12/2020   AST 21 08/12/2020   NA  138 08/12/2020   K 3.8 08/12/2020   CL 104 08/12/2020   CREATININE 1.15 (H) 08/12/2020   BUN 22 08/12/2020   CO2 27 08/12/2020   TSH 3.422 08/11/2020   INR 1.1 08/10/2020   HGBA1C 7.4 (H) 08/10/2020   MICROALBUR 1.8 03/18/2020   Assessment/Plan:  Taylee Gunnells is a 80 y.o. White or Caucasian [1] female with  has a past medical history of ALLERGIC RHINITIS (06/24/2007), Anemia, AVM (arteriovenous malformation) of colon, Blood transfusion without reported diagnosis (05/2015), COLONIC POLYPS, HX OF (06/24/2007), Coronary artery disease, non-occlusive (05/2015), DEPRESSION (10/08/2006), DIABETES MELLITUS, TYPE II (10/05/2006), GERD (06/24/2007), HYPERLIPIDEMIA (10/08/2006), HYPERTENSION (10/08/2006), INSOMNIA (09/24/2008), Left knee DJD, Moderate to severe aortic stenosis (05/2015), Myocardial infarction ([place]) (05/2015), Osteoporosis (03/10/2016), and PEPTIC ULCER  DISEASE (10/08/2006).  Chronic diastolic heart failure (HCC) Stable volume, cont current med tx - does not require diuretic at this time  Type 2 diabetes mellitus with hyperglycemia, without long-term current use of insulin (HCC) Lab Results  Component Value Date   HGBA1C 7.4 (H) 08/10/2020   Stable, pt to continue on amaryl, metformiin, declines dm education for now   Hyperlipidemia with target LDL less than 70 Lab Results  Component Value Date   LDLCALC 48 08/11/2020   Stable, pt to continue current statin - lipitor 40   Essential hypertension BP Readings from Last 3 Encounters:  08/19/20 120/74  08/12/20 (!) 157/46  03/18/20 128/72   Stable, pt to continue medical treatment  - cont off telmisartan for now   Atrial fibrillation with RVR ([place]) Rate stable, cont current med tx - coreg  History of stroke Stable, has f/u neurology as planned, continue eliquis  Followup: Return in about 3 months (around 11/19/2020).  Cathlean Cower, MD 08/22/2020 8:58 PM [facility]

## 2020-08-22 NOTE — Telephone Encounter (Signed)
Please refill as per office routine med refill policy (all routine meds refilled for 3 mo or monthly per pt preference up to one year from last visit, then month to month grace period for 3 mo, then further med refills will have to be denied)  

## 2020-08-22 NOTE — Assessment & Plan Note (Signed)
Stable volume, cont current med tx - does not require diuretic at this time

## 2020-08-22 NOTE — Assessment & Plan Note (Signed)
BP Readings from Last 3 Encounters:  08/19/20 120/74  08/12/20 (!) 157/46  03/18/20 128/72   Stable, pt to continue medical treatment  - cont off telmisartan for now

## 2020-08-22 NOTE — Assessment & Plan Note (Signed)
Rate stable, cont current med tx - coreg

## 2020-08-22 NOTE — Assessment & Plan Note (Signed)
Lab Results  Component Value Date   LDLCALC 48 08/11/2020   Stable, pt to continue current statin - lipitor 40

## 2020-08-22 NOTE — Assessment & Plan Note (Addendum)
Stable, has f/u neurology as planned, continue eliquis

## 2020-08-22 NOTE — Assessment & Plan Note (Signed)
Lab Results  Component Value Date   HGBA1C 7.4 (H) 08/10/2020   Stable, pt to continue on amaryl, metformiin, declines dm education for now

## 2020-08-23 ENCOUNTER — Other Ambulatory Visit: Payer: Self-pay

## 2020-08-23 ENCOUNTER — Ambulatory Visit (HOSPITAL_COMMUNITY)
Admission: RE | Admit: 2020-08-23 | Discharge: 2020-08-23 | Disposition: A | Discharge status: Home / Self Care | Payer: Medicare Other | Source: Ambulatory Visit | Attending: Physician Assistant | Admitting: Physician Assistant

## 2020-08-23 ENCOUNTER — Encounter (HOSPITAL_COMMUNITY): Payer: Self-pay | Admitting: Physician Assistant

## 2020-08-23 VITALS — BP 142/74 | HR 59 | Ht 61.0 in | Wt 209.6 lb

## 2020-08-23 DIAGNOSIS — Z7984 Long term (current) use of oral hypoglycemic drugs: Secondary | ICD-10-CM | POA: Diagnosis not present

## 2020-08-23 DIAGNOSIS — I110 Hypertensive heart disease with heart failure: Secondary | ICD-10-CM | POA: Insufficient documentation

## 2020-08-23 DIAGNOSIS — Z886 Allergy status to analgesic agent status: Secondary | ICD-10-CM | POA: Diagnosis not present

## 2020-08-23 DIAGNOSIS — E669 Obesity, unspecified: Secondary | ICD-10-CM | POA: Diagnosis not present

## 2020-08-23 DIAGNOSIS — I4891 Unspecified atrial fibrillation: Secondary | ICD-10-CM | POA: Insufficient documentation

## 2020-08-23 DIAGNOSIS — Z6839 Body mass index (BMI) 39.0-39.9, adult: Secondary | ICD-10-CM | POA: Diagnosis not present

## 2020-08-23 DIAGNOSIS — I5032 Chronic diastolic (congestive) heart failure: Secondary | ICD-10-CM | POA: Insufficient documentation

## 2020-08-23 DIAGNOSIS — Z8673 Personal history of transient ischemic attack (TIA), and cerebral infarction without residual deficits: Secondary | ICD-10-CM | POA: Insufficient documentation

## 2020-08-23 DIAGNOSIS — Z8249 Family history of ischemic heart disease and other diseases of the circulatory system: Secondary | ICD-10-CM | POA: Insufficient documentation

## 2020-08-23 DIAGNOSIS — I2510 Atherosclerotic heart disease of native coronary artery without angina pectoris: Secondary | ICD-10-CM | POA: Diagnosis not present

## 2020-08-23 DIAGNOSIS — E785 Hyperlipidemia, unspecified: Secondary | ICD-10-CM | POA: Diagnosis not present

## 2020-08-23 DIAGNOSIS — Z79899 Other long term (current) drug therapy: Secondary | ICD-10-CM | POA: Diagnosis not present

## 2020-08-23 DIAGNOSIS — E119 Type 2 diabetes mellitus without complications: Secondary | ICD-10-CM | POA: Insufficient documentation

## 2020-08-23 DIAGNOSIS — D6869 Other thrombophilia: Secondary | ICD-10-CM | POA: Diagnosis not present

## 2020-08-23 DIAGNOSIS — Z833 Family history of diabetes mellitus: Secondary | ICD-10-CM | POA: Diagnosis not present

## 2020-08-23 DIAGNOSIS — Z7901 Long term (current) use of anticoagulants: Secondary | ICD-10-CM | POA: Insufficient documentation

## 2020-08-23 DIAGNOSIS — I480 Paroxysmal atrial fibrillation: Secondary | ICD-10-CM | POA: Insufficient documentation

## 2020-08-23 DIAGNOSIS — I350 Nonrheumatic aortic (valve) stenosis: Secondary | ICD-10-CM | POA: Insufficient documentation

## 2020-08-23 NOTE — Progress Notes (Signed)
  Primary Care Physician: John, James W, MD Primary Cardiologist: Dr Harding Primary Electrophysiologist: none Referring Physician: Dr Woods   Mary Mccarthy is a 79 y.o. female with a history of aortic stenosis, HLD, HTN, pulmonary HTN, chronic diastolic CHF, CAD, DM, prior CVA, and atrial fibrillation who presents for consultation in the [facility].  The patient was initially diagnosed with atrial fibrillation 08/10/20 after presenting to the ED with symptoms of leg weakness and slurred speech. She was in afib with RVR and was also found to have an acute CVA. She converted to SR spontaneously. Patient was started on Eliquis for a CHADS2VASC score of 8. She reports that since then she has felt well. She denies any bleeding issues on anticoagulation.   Today, she denies symptoms of palpitations, chest pain, shortness of breath, orthopnea, PND, lower extremity edema, dizziness, presyncope, syncope, snoring, daytime somnolence, bleeding, or neurologic sequela. The patient is tolerating medications without difficulties and is otherwise without complaint today.    Atrial Fibrillation Risk Factors:  she does not have symptoms or diagnosis of sleep apnea. she does not have a history of rheumatic fever. The patient does have a history of early familial atrial fibrillation or other arrhythmias. Mother, twin sister, 3 brothers and nephew all had afib.  she has a BMI of Body mass index is 39.6 kg/m.. Filed Weights   08/23/20 1504  Weight: 95.1 kg    Family History  Problem Relation Age of Onset   Alcohol abuse Other    Arthritis Other        DJD   Hyperlipidemia Other    Heart disease Other    Stroke Other    Hypertension Other    Depression Other    Diabetes Other    Cancer Mother        ENT cancer   CAD Sister        Several MI & PPM; long term smoker, EtOH   Heart attack Sister 55       several   Arrhythmia Sister        s/p PPM (Dr. Croitoru)      Atrial Fibrillation Management history:  Previous antiarrhythmic drugs: none Previous cardioversions: none Previous ablations: none CHADS2VASC score: 8 Anticoagulation history: Eliquis   Past Medical History:  Diagnosis Date   ALLERGIC RHINITIS 06/24/2007   Anemia    AVM (arteriovenous malformation) of colon    Blood transfusion without reported diagnosis 05/2015   COLONIC POLYPS, HX OF 06/24/2007   Coronary artery disease, non-occlusive 05/2015   Trop + w/ Acute Anemia =>CATH: small RI - Ostial 60%, ostial RCA 30% and dLAD 40-50%;; 9/'21: Cor Ca Score 624. Mild (25-49%) prox RCA & LAD,; Moderate (50-69%) Ostial Small RI & prox LCx.     DEPRESSION 10/08/2006   DIABETES MELLITUS, TYPE II 10/05/2006   GERD 06/24/2007   HYPERLIPIDEMIA 10/08/2006   HYPERTENSION 10/08/2006   INSOMNIA 09/24/2008   Left knee DJD    Moderate to severe aortic stenosis 05/2015   a) Mod AS (mean gras 22 mmHg, peak 42 mmHg -AVA 1.1-1.2 cm.);b) 04/2017: Severe Ca2+ AoV w/ Mod-Severe AS (p-m grad 60-34 mmHg -Moderate), calculated AVA 0.8 cm -Severe;;c) 03/2019: Mod AS (m grad 31 mmHg); d) 09/2019: Mod AS (m grad 34.5 mmHg).   Myocardial infarction (HCC) 05/2015   Type 2 MI - related to GI Bleed / Anemia (non-obstructive CAD by Cath)   Osteoporosis 03/10/2016   PEPTIC ULCER DISEASE 10/08/2006   Past   Surgical History:  Procedure Laterality Date   APPENDECTOMY     BREAST BIOPSY     CARDIAC CATHETERIZATION N/A 05/21/2015   Procedure: Left Heart Cath and Coronary Angiography;  Surgeon: Leonie Man, MD;  Location: [place] [facility];  Service: Cardiovascular;: Ost RI 60%, Ost RCA 30%, dLAD tapers to small vessel w/ 40-50%. Mildly elevated LVEDP. Normal LV Fxn.   COLONOSCOPY N/A 05/20/2015   Procedure: COLONOSCOPY;  Surgeon: Manus Gunning, MD;  Location: [place];  Service: Gastroenterology;  Laterality: N/A;   CORONARY CA2+ SCORE / CARDIAC CT ANGIOGRAM  10/09/2019   Calcium score 624.  82nd percentile.  Dominant RCA: Mild (25-49%) proximal stenosis-distal bifurcation into PDA and PAV--< RPL branches.  LAD (1 major mid vessel diagonal) diffuse calcified plaque, mild proximal stenosis with minimal distal stenosis.  Small RI moderate ostial disease.  LCx-moderate mixed (50-69%) proximal stenosis.  Small dOM1 disease.  Trileaflet AoV, annular Ca2+ - probable AS   ESOPHAGOGASTRODUODENOSCOPY N/A 05/20/2015   Procedure: ESOPHAGOGASTRODUODENOSCOPY (EGD);  Surgeon: Manus Gunning, MD;  Location: [place];  Service: Gastroenterology;  Laterality: N/A;   TONSILLECTOMY     TRANSTHORACIC ECHOCARDIOGRAM  03/2019; 09/2019   a) EF 60 to 65%.  Moderate LVH.  GRII DD.  Mod-Severe AS (m grad 36 mmHg, peak 59 mmHg); b) EF 65-70%, No RWMA. Gr1 DD/hi LAP, Mild hi PAP. Mod LA Dil. MOD AS (mean Grad 34.5 mmHg).  STABLE    TRANSTHORACIC ECHOCARDIOGRAM  04/12/2017   EF 60-65%. No RWMA.  GR 1 DD.  Moderate concentric LVH.  Mild LA dilation. Severe calcified aortic valve with moderate-severe aortic stenosis (peak/mean gradients 60/34 mmHg) and in severe range by AVA (0.8 cm2).  Mild to moderately increased PA pressures 41 mmHg with normal RV size and function..     TUBAL LIGATION      Current Outpatient Medications  Medication Sig Dispense Refill   acetaminophen (TYLENOL) 500 MG tablet Take 500 mg by mouth every 6 (six) hours as needed for moderate pain.     apixaban (ELIQUIS) 5 MG TABS tablet Take 1 tablet (5 mg total) by mouth 2 (two) times daily. 60 tablet 5   atorvastatin (LIPITOR) 40 MG tablet TAKE 1 TABLET BY MOUTH EVERY DAY 90 tablet 3   blood glucose meter kit and supplies Dispense based on patient and insurance preference. Use up to four times daily as directed. E11.9 1 each 0   Blood Glucose Monitoring Suppl (ONE TOUCH ULTRA 2) w/Device KIT Use as directed daily 1 each 0   carvedilol (COREG) 6.25 MG tablet TAKE 1 TABLET BY MOUTH TWICE A DAY 180 tablet 2   clonazePAM (KLONOPIN) 0.5 MG tablet TAKE 1 TABLET  BY MOUTH TWICE A DAY AS NEEDED FOR ANXIETY 60 tablet 1   cyanocobalamin ([employer] VITAMIN B12) 1000 MCG tablet Take 1 tablet (1,000 mcg total) by mouth daily. 90 tablet 0   glimepiride (AMARYL) 2 MG tablet Take 1 tablet (2 mg total) by mouth daily with breakfast. 30 tablet 0   irbesartan (AVAPRO) 300 MG tablet Take 1 tablet (300 mg total) by mouth daily. 30 tablet 0   Lancets (ONETOUCH DELICA PLUS [id number]) MISC USE AS DIRECTED TEST 1-2 TIMES A DAY 100 each 23   metFORMIN (GLUCOPHAGE) 1000 MG tablet TAKE 1 TAB BY MOUTH IN THE AM,AND 1/2 TAB IN THE PM 135 tablet 0   ONETOUCH ULTRA test strip USE AS INSTRUCTED TEST 1-2 TIMES A DAY 100 strip 23   pantoprazole (  PROTONIX) 40 MG tablet Take 1 tablet (40 mg total) by mouth daily. 90 tablet 0   PFIZER-BIONT COVID-19 VAC-TRIS SUSP injection      No current facility-administered medications for this encounter.    Allergies  Allergen Reactions   Ibuprofen Other (See Comments)    Bleeding events    Social History   Socioeconomic History   Marital status: Widowed    Spouse name: Not on file   Number of children: 1   Years of education: Not on file   Highest education level: Not on file  Occupational History   Occupation: retired - chemical plant worker    Employer: RETIRED  Tobacco Use   Smoking status: Never   Smokeless tobacco: Never  Substance and Sexual Activity   Alcohol use: No    Alcohol/week: 0.0 standard drinks   Drug use: No   Sexual activity: Not on file  Other Topics Concern   Not on file  Social History Narrative   Recently widowed--husband died in October 2020.      Now lives alone but her son usually stays with her at night.  During the day she has 2 nephews who live nearby to come in and check on her intermittently.  Also one of her nieces calls routinely.      When her son is home and awake, she may try to walk on a treadmill, but is scared to walk outside.   Social Determinants of Health   Financial Resource Strain:  Not on file  Food Insecurity: Not on file  Transportation Needs: Not on file  Physical Activity: Not on file  Stress: Not on file  Social Connections: Not on file  Intimate Partner Violence: Not on file     ROS- All systems are reviewed and negative except as per the HPI above.  Physical Exam: Vitals:   08/23/20 1504  BP: (!) 142/74  Pulse: (!) 59  Weight: 95.1 kg  Height: 5' 1" (1.549 m)    GEN- The patient is a well appearing obese elderly female, alert and oriented x 3 today.   Head- normocephalic, atraumatic Eyes-  Sclera clear, conjunctiva pink Ears- hearing intact Oropharynx- clear Neck- supple  Lungs- Clear to ausculation bilaterally, normal work of breathing Heart- Regular rate and rhythm, no rubs or gallops, 3/6 systolic murmur. GI- soft, NT, ND, + BS Extremities- no clubbing, cyanosis, or edema MS- no significant deformity or atrophy Skin- no rash or lesion Psych- euthymic mood, full affect Neuro- strength and sensation are intact  Wt Readings from Last 3 Encounters:  08/23/20 95.1 kg  08/19/20 94.5 kg  08/12/20 94.9 kg    EKG today demonstrates  SB, baseline ST changes Vent. rate 59 BPM PR interval 146 ms QRS duration 90 ms QT/QTcB 402/397 ms  Echo 08/10/20 demonstrated   1. The aortic valve is calcified. Aortic valve regurgitation is not  visualized. Severe aortic valve stenosis. Aortic valve area, by VTI  measures 0.86 cm. Aortic valve mean gradient measures 42.0 mmHg. Aortic  valve Vmax measures 4.36 m/s.   2. Left ventricular ejection fraction, by estimation, is 55 to 60%. The  left ventricle has normal function. The left ventricle has no regional  wall motion abnormalities. There is severe concentric left ventricular  hypertrophy. Left ventricular diastolic   parameters are indeterminate.   3. Right ventricular systolic function is normal. The right ventricular  size is normal. There is moderately elevated pulmonary artery systolic   pressure.   4. Left   atrial size was mildly dilated.   5. The pericardial effusion is circumferential.   6. The mitral valve is abnormal. Trivial mitral valve regurgitation. The  mean mitral valve gradient is 2.0 mmHg with average heart rate of 53 bpm.  Severe mitral annular calcification.   Comparison(s): A prior study was performed on 10/01/2019. Prior images  reviewed side by side. Further increase in aortic valve gradients.   Epic records are reviewed at length today  CHA2DS2-VASc Score = 8  The patient's score is based upon: CHF History: No HTN History: Yes Diabetes History: Yes Stroke History: Yes Vascular Disease History: Yes Age Score: 2 Gender Score: 1     ASSESSMENT AND PLAN: 1. Paroxysmal Atrial Fibrillation (ICD10:  I48.0) The patient's CHA2DS2-VASc score is 8, indicating a 10.8% annual risk of stroke.   General education about afib provided and questions answered. We also discussed her stroke risk and the risks and benefits of anticoagulation. Continue Eliquis 5 mg BID Stop ASA given h/o significant GI bleeding. Discharge paperwork from hospital mentions ILR. Could consider device for afib management.  2. Secondary Hypercoagulable State (ICD10:  D68.69) The patient is at significant risk for stroke/thromboembolism based upon her CHA2DS2-VASc Score of 8.  Continue Apixaban (Eliquis).   3. Obesity Body mass index is 39.6 kg/m. Lifestyle modification was discussed at length including regular exercise and weight reduction.  4. CAD No anginal symptoms.  5. HTN Stable, no changes today.  6. Aortic stenosis Mod-severe Followed by Dr Ellyn Hack.   Follow up with Dr Ellyn Hack as scheduled.    [facility] [address] [place], [place] [address] [phone] 08/23/2020 4:39 PM

## 2020-08-23 NOTE — Telephone Encounter (Signed)
Called the patient to discuss the loop recorder implant. She has decided to go through with the procedure. She had discussed it at her afib clinic appointment today.  Procedure has been scheduled for 09/06/20 with Dr. Royann Shivers. Instructions provided.    [facility] at [facility]  [address]  [place], [place] [address]  Phone: [phone] Fax: [phone]   You are scheduled for a Loop Recorder Implant on 09/06/20  at 1 pm with Dr. Royann Shivers. This will take place at [address].    Please arrive at your appointment 15-20 minutes early.   You do not need to be fasting.   The procedure is performed with local anesthesia. You will not receive sedatives nor will an IV be placed.   Wash your chest and neck with the surgical soap the evening before and the morning of your procedure. Please following the washing instructions provided.   As with all surgical implants, there is a small risk of infection. If an infection occurs, the device will be removed. To help reduce the risk, please use the surgical scrub provided. Additional antiseptic precautions will be taken at the time of the procedure.   Please bring your insurance cards.   *Please note that scheduled loop recorder implants may need to be rescheduled if Dr. Royann Shivers has a procedure urgently added on at the hospital   Preparing for the Procedure  Before the procedure, you can play an important role. Because skin is not sterile, your skin needs to be as free of germs as possible. You can reduce the number of germs on your skin by washing with CHG (chlorhexidine gluconate) Soap before the procedure. CHG is an antiseptic cleaner which kills germs and bonds with the skin to continue killing germs even after washing.  Please do not use if you have an allergy to CHG or antibacterial soaps. If your skin becomes reddened/irritated, STOP using the CHG.  DO NOT SHAVE (including legs and  underarms) for at least 48 hours prior to first CHG shower. It is OK to shave your face.  Please follow these instructions carefully: Shower the night before the procedure and the morning of with CHG Soap. If you chose to wash your hair, wash your hair first as usual with your normal shampoo/conditioner. After you shampoo/condition, rinse you hair and body thoroughly to remove shampoo/conditioner. Use CHG as you would any other liquid soap. You can apply CHG directly to the skin and wash gently with a loofah or a clean washcloth. Apply the CHG Soap to your body ONLY FROM THE NECK DOWN. Do not use on open wounds or open sores. Avoid contact with your eyes, ears, mouth, and genitals (private parts).  Wash thoroughly, paying special attention to the area where your surgery will be performed. Thoroughly rinse your body with warm water from the neck down. DO NOT shower/wash with your normal soap after using and rinsing off the CHG Soap. Pat yourself dry with a clean towel. Wear clean pajamas to bed. Place clean sheets on your bed the night of your first shower and do not sleep with pets..  Day of Surgery: Shower with the CHG Soap following the instructions listed above. DO NOT apply deodorants or lotions.

## 2020-08-24 ENCOUNTER — Other Ambulatory Visit: Payer: Self-pay | Admitting: *Deleted

## 2020-08-24 DIAGNOSIS — I110 Hypertensive heart disease with heart failure: Secondary | ICD-10-CM | POA: Diagnosis not present

## 2020-08-24 DIAGNOSIS — D509 Iron deficiency anemia, unspecified: Secondary | ICD-10-CM | POA: Diagnosis not present

## 2020-08-24 DIAGNOSIS — I4891 Unspecified atrial fibrillation: Secondary | ICD-10-CM | POA: Diagnosis not present

## 2020-08-24 DIAGNOSIS — E119 Type 2 diabetes mellitus without complications: Secondary | ICD-10-CM | POA: Diagnosis not present

## 2020-08-24 DIAGNOSIS — I5032 Chronic diastolic (congestive) heart failure: Secondary | ICD-10-CM | POA: Diagnosis not present

## 2020-08-24 NOTE — Patient Outreach (Signed)
[facility]) Care Management  08/24/2020  Mary Mccarthy 03-21-1940 [id number]   RED ON EMMI ALERT - Stroke Day # 1 Date: 7/9 Red Alert Reason: Problems setting up rehab   Outreach attempt #3, successful.  Identity verified.  This care manager introduced self and stated purpose of call.  [facility] care management services explained.    Member report she is doing "very well."  No longer having issues with setting up rehab, currently awaiting HHPT's arrival to the home.  State she has been able to manage her health care, denies any questions about medication management.  She confirms she was seen by both PCP and cardiology in the last week.  Will need loop recorder placed, concerned about the cost.  She will call insurance company to confirm it has been approved and what her copay will be.  Also voices concern about upcoming hospital bills and ability to pay.  Provided with [facility] Financial Assistance phone number, encouraged to call.  Has another appointment with cardiology on 8/1, PCP on 8/11, and neurology on 8/18.  Denies any urgent concerns, encouraged to contact this care manager with questions.   Plan: RN CM will notify chronic care manager at PCP office that member will need ongoing follow up.  Will close case to telephone case management with [facility] at this time.  Kemper Durie, [place], MSN [facility] Care Management  [facility] Manager [phone]

## 2020-08-25 ENCOUNTER — Telehealth: Payer: Self-pay | Admitting: Cardiovascular Disease

## 2020-08-25 DIAGNOSIS — D509 Iron deficiency anemia, unspecified: Secondary | ICD-10-CM | POA: Diagnosis not present

## 2020-08-25 DIAGNOSIS — I4891 Unspecified atrial fibrillation: Secondary | ICD-10-CM | POA: Diagnosis not present

## 2020-08-25 DIAGNOSIS — I110 Hypertensive heart disease with heart failure: Secondary | ICD-10-CM | POA: Diagnosis not present

## 2020-08-25 DIAGNOSIS — I5032 Chronic diastolic (congestive) heart failure: Secondary | ICD-10-CM | POA: Diagnosis not present

## 2020-08-25 DIAGNOSIS — E119 Type 2 diabetes mellitus without complications: Secondary | ICD-10-CM | POA: Diagnosis not present

## 2020-08-25 NOTE — Telephone Encounter (Signed)
Patient is scheduled for an ILR implant on 09/06/20. She is requesting to speak with Dr. Erin Hearing nurse regarding this appointment. She states she spoke with her insurance about the authorization for this implant and she would like to update Misty Stanley, Charity fundraiser.

## 2020-08-25 NOTE — Telephone Encounter (Signed)
Spoke with the patient. She stated that she called her insurance company and her payment for the loop implant would be $225. She stated that she could pay that but wanted to know if she could set up a payment plan for it.   Message sent to billing.

## 2020-08-26 DIAGNOSIS — I5032 Chronic diastolic (congestive) heart failure: Secondary | ICD-10-CM | POA: Diagnosis not present

## 2020-08-26 DIAGNOSIS — I110 Hypertensive heart disease with heart failure: Secondary | ICD-10-CM | POA: Diagnosis not present

## 2020-08-26 DIAGNOSIS — I4891 Unspecified atrial fibrillation: Secondary | ICD-10-CM | POA: Diagnosis not present

## 2020-08-26 DIAGNOSIS — E119 Type 2 diabetes mellitus without complications: Secondary | ICD-10-CM | POA: Diagnosis not present

## 2020-08-26 DIAGNOSIS — D509 Iron deficiency anemia, unspecified: Secondary | ICD-10-CM | POA: Diagnosis not present

## 2020-08-27 HISTORY — PX: TRANSTHORACIC ECHOCARDIOGRAM: SHX275

## 2020-08-31 ENCOUNTER — Telehealth: Payer: Self-pay | Admitting: Cardiology

## 2020-08-31 DIAGNOSIS — D509 Iron deficiency anemia, unspecified: Secondary | ICD-10-CM | POA: Diagnosis not present

## 2020-08-31 DIAGNOSIS — I4891 Unspecified atrial fibrillation: Secondary | ICD-10-CM | POA: Diagnosis not present

## 2020-08-31 DIAGNOSIS — I110 Hypertensive heart disease with heart failure: Secondary | ICD-10-CM | POA: Diagnosis not present

## 2020-08-31 DIAGNOSIS — E119 Type 2 diabetes mellitus without complications: Secondary | ICD-10-CM | POA: Diagnosis not present

## 2020-08-31 DIAGNOSIS — I5032 Chronic diastolic (congestive) heart failure: Secondary | ICD-10-CM | POA: Diagnosis not present

## 2020-08-31 MED ORDER — IRBESARTAN 300 MG PO TABS: 300.0000 mg | ORAL_TABLET | Freq: Every day | ORAL | 1 refills | Status: DC

## 2020-08-31 NOTE — Telephone Encounter (Signed)
*  STAT* If patient is at the pharmacy, call can be transferred to refill team.   1. Which medications need to be refilled? (please list name of each medication and dose if known)  irbesartan (AVAPRO) 300 MG tablet  2. Which pharmacy/location (including street and city if local pharmacy) is medication to be sent to? [id number] - [place], [place] - [address]  3. Do they need a 30 day or 90 day supply?  90 day supply  Will be out of medication on 09/04/20.

## 2020-09-01 DIAGNOSIS — E119 Type 2 diabetes mellitus without complications: Secondary | ICD-10-CM | POA: Diagnosis not present

## 2020-09-01 DIAGNOSIS — I4891 Unspecified atrial fibrillation: Secondary | ICD-10-CM | POA: Diagnosis not present

## 2020-09-01 DIAGNOSIS — I110 Hypertensive heart disease with heart failure: Secondary | ICD-10-CM | POA: Diagnosis not present

## 2020-09-01 DIAGNOSIS — I5032 Chronic diastolic (congestive) heart failure: Secondary | ICD-10-CM | POA: Diagnosis not present

## 2020-09-01 DIAGNOSIS — D509 Iron deficiency anemia, unspecified: Secondary | ICD-10-CM | POA: Diagnosis not present

## 2020-09-02 ENCOUNTER — Telehealth: Payer: Self-pay | Admitting: *Deleted

## 2020-09-02 NOTE — Chronic Care Management (AMB) (Signed)
  Chronic Care Management   Outreach Note  09/02/2020 Name: Mary Mccarthy MRN: [id number] DOB: 1940/12/17  Mary Mccarthy is a 80 y.o. year old female who is a primary care patient of Jonny Ruiz, Len Blalock, MD. I reached out to [name] by phone today in response to a referral sent by Ms. Lum Keas PCP, Dr. Jonny Ruiz      An unsuccessful telephone outreach was attempted today. The patient was referred to the case management team for assistance with care management and care coordination.   Follow Up Plan: A HIPAA compliant phone message was left for the patient providing contact information and requesting a return call. The care management team will reach out to the patient again over the next 7 days.  If patient returns call to provider office, please advise to call Embedded Care Management Care Guide Gwenevere Ghazi at [phone].  Gwenevere Ghazi  Care Guide, Embedded Care Coordination [facility] Management  Direct Dial: [phone]

## 2020-09-03 DIAGNOSIS — D509 Iron deficiency anemia, unspecified: Secondary | ICD-10-CM | POA: Diagnosis not present

## 2020-09-03 DIAGNOSIS — E119 Type 2 diabetes mellitus without complications: Secondary | ICD-10-CM | POA: Diagnosis not present

## 2020-09-03 DIAGNOSIS — I5032 Chronic diastolic (congestive) heart failure: Secondary | ICD-10-CM | POA: Diagnosis not present

## 2020-09-03 DIAGNOSIS — I110 Hypertensive heart disease with heart failure: Secondary | ICD-10-CM | POA: Diagnosis not present

## 2020-09-03 DIAGNOSIS — I4891 Unspecified atrial fibrillation: Secondary | ICD-10-CM | POA: Diagnosis not present

## 2020-09-05 ENCOUNTER — Other Ambulatory Visit: Payer: Self-pay | Admitting: Internal Medicine

## 2020-09-06 ENCOUNTER — Ambulatory Visit (INDEPENDENT_AMBULATORY_CARE_PROVIDER_SITE_OTHER): Payer: Medicare Other | Admitting: Cardiovascular Disease

## 2020-09-06 ENCOUNTER — Other Ambulatory Visit: Payer: Self-pay

## 2020-09-06 DIAGNOSIS — I480 Paroxysmal atrial fibrillation: Secondary | ICD-10-CM

## 2020-09-06 NOTE — Chronic Care Management (AMB) (Signed)
  Chronic Care Management   Outreach Note  09/06/2020 Name: Mary Mccarthy MRN: [id number] DOB: 06-24-1940  Mary Mccarthy is a 80 y.o. year old female who is a primary care patient of Jonny Ruiz, Len Blalock, MD. I reached out to [name] by phone today in response to a referral sent by Ms. Lum Keas PCP, Dr. Jonny Ruiz.      A second unsuccessful telephone outreach was attempted today. The patient was referred to the case management team for assistance with care management and care coordination.   Follow Up Plan: A HIPAA compliant phone message was left for the patient providing contact information and requesting a return call. The care management team will reach out to the patient again over the next 7 days.  If patient returns call to provider office, please advise to call Embedded Care Management Care Guide Misty Stanley at [phone].  Gwenevere Ghazi  Care Guide, Embedded Care Coordination [facility] Management  Direct Dial: [phone]

## 2020-09-06 NOTE — Progress Notes (Signed)
Patient referred for implantable loop recorder after a stroke.  atrial fibrillation was identified on EMS ECG and on first ED ECG on July 5. She has already started treatment with apixaban and is tolerating it well.  A loop recorder is not indicated any longer.  I am concerned that carvedilol was stopped (at the time of stroke?) and may have problems with RVR in the future.  Echo performed during stroke hospitalization shows aortic stenosis has progressed to severe range (peak velocity 4.36 m/s, mean gradient 42 mm Hg, AV area 0.86 cm, dimensionless index 0.27). She does not have exertional angina/dyspnea/syncope, but is fairly sedentary (walks with a cane).  It will soon be time for her to start work-up for TAVR. In fact, the new atrial fibrillation may be a sign that AS has progressed to symptomatic phase. She has an appointment with Dr. Herbie Baltimore October 24 - will message him to see if he would like to see her sooner (ideally will wait about 3 months from her stroke before TAVR, so no sooner than early October anyway).

## 2020-09-07 DIAGNOSIS — I110 Hypertensive heart disease with heart failure: Secondary | ICD-10-CM | POA: Diagnosis not present

## 2020-09-07 DIAGNOSIS — D509 Iron deficiency anemia, unspecified: Secondary | ICD-10-CM | POA: Diagnosis not present

## 2020-09-07 DIAGNOSIS — I5032 Chronic diastolic (congestive) heart failure: Secondary | ICD-10-CM | POA: Diagnosis not present

## 2020-09-07 DIAGNOSIS — E119 Type 2 diabetes mellitus without complications: Secondary | ICD-10-CM | POA: Diagnosis not present

## 2020-09-07 DIAGNOSIS — I4891 Unspecified atrial fibrillation: Secondary | ICD-10-CM | POA: Diagnosis not present

## 2020-09-08 NOTE — Chronic Care Management (AMB) (Signed)
  Chronic Care Management   Note  09/08/2020 Name: Mary Mccarthy MRN: [id number] DOB: 06-04-1940  Mary Mccarthy is a 80 y.o. year old female who is a primary care patient of Jenny Reichmann, Hunt Oris, MD. I reached out to Mary Mccarthy by phone today in response to a referral sent by Ms. Jari Pigg PCP, Dr. Jenny Reichmann      Ms. Seelman was given information about Chronic Care Management services today including:  CCM service includes personalized support from designated clinical staff supervised by her physician, including individualized plan of care and coordination with other care providers 24/7 contact phone numbers for assistance for urgent and routine care needs. Service will only be billed when office clinical staff spend 20 minutes or more in a month to coordinate care. Only one practitioner may furnish and bill the service in a calendar month. The patient may stop CCM services at any time (effective at the end of the month) by phone call to the office staff. The patient will be responsible for cost sharing (co-pay) of up to 20% of the service fee (after annual deductible is met).  Patient agreed to services and verbal consent obtained.   Follow up plan: Telephone appointment with care management team member scheduled for:10/01/20  Daleiza Bacchi  Care Guide, Embedded Care Coordination [facility]  Care Management  Direct Dial: [phone]

## 2020-09-09 DIAGNOSIS — I5032 Chronic diastolic (congestive) heart failure: Secondary | ICD-10-CM | POA: Diagnosis not present

## 2020-09-09 DIAGNOSIS — D509 Iron deficiency anemia, unspecified: Secondary | ICD-10-CM | POA: Diagnosis not present

## 2020-09-09 DIAGNOSIS — E119 Type 2 diabetes mellitus without complications: Secondary | ICD-10-CM | POA: Diagnosis not present

## 2020-09-09 DIAGNOSIS — I110 Hypertensive heart disease with heart failure: Secondary | ICD-10-CM | POA: Diagnosis not present

## 2020-09-09 DIAGNOSIS — I4891 Unspecified atrial fibrillation: Secondary | ICD-10-CM | POA: Diagnosis not present

## 2020-09-09 NOTE — Progress Notes (Signed)
OK - we can try to get her in to see me or an APP ASAP to start the ~PreTAVR workup - I.e. R&LHC, TEE etc.  Also to reassess Afib.  Bryan Lemma, MD

## 2020-09-10 DIAGNOSIS — D509 Iron deficiency anemia, unspecified: Secondary | ICD-10-CM | POA: Diagnosis not present

## 2020-09-10 DIAGNOSIS — I4891 Unspecified atrial fibrillation: Secondary | ICD-10-CM | POA: Diagnosis not present

## 2020-09-10 DIAGNOSIS — I110 Hypertensive heart disease with heart failure: Secondary | ICD-10-CM | POA: Diagnosis not present

## 2020-09-10 DIAGNOSIS — E119 Type 2 diabetes mellitus without complications: Secondary | ICD-10-CM | POA: Diagnosis not present

## 2020-09-10 DIAGNOSIS — I5032 Chronic diastolic (congestive) heart failure: Secondary | ICD-10-CM | POA: Diagnosis not present

## 2020-09-16 ENCOUNTER — Other Ambulatory Visit: Payer: Self-pay

## 2020-09-16 ENCOUNTER — Other Ambulatory Visit: Payer: Self-pay | Admitting: Internal Medicine

## 2020-09-16 ENCOUNTER — Ambulatory Visit: Payer: Medicare Other | Admitting: Internal Medicine

## 2020-09-16 NOTE — Progress Notes (Signed)
Unfortunately, she did not get from a schedule before my leaving on vacation. Maybe we can get her in to be seen by an APP  in the next week or 2, if not potentially add her onto my schedule when I get back.  The other option would be simply to go ahead and have her scheduled to see the TAVR clinic docs and get scheduled for right and left heart cath that way.  Bryan Lemma, MD

## 2020-09-20 ENCOUNTER — Telehealth: Payer: Self-pay

## 2020-09-20 NOTE — Telephone Encounter (Signed)
Patient called in requesting medication for sore throat, cough, and achiness. Phone visit scheduled.

## 2020-09-20 NOTE — Progress Notes (Signed)
Virtual Visit via telephone Note  I connected with Mary Mccarthy on 09/20/20 at  8:50 AM EDT by telephone and verified that I am speaking with the correct person using two identifiers.   I discussed the limitations of evaluation and management by telemedicine and the availability of in person appointments. The patient expressed understanding and agreed to proceed.  Present for the visit:  Myself, Dr Cheryll Cockayne, Mary Mccarthy.  The patient is currently at home and I am in the office.    No referring provider.    History of Present Illness: She is here for an acute visit for cold symptoms.   Her symptoms started last Thursday, 6 days ago  She is experiencing sore throat and it was not bad.  She came for an appt and was told she could not see dr Jonny Ruiz.  Then developed headache, chest pounding and cough.  Her symptoms got worse.  By evening she could probably breath.   She has tried taking coricidin hbp.  She started mucinex yesterday.    She feels a little better today.     Took two covid tests at home were negative - one was yesterday.   Review of Systems  Constitutional:  Positive for chills, fever and malaise/fatigue.  HENT:  Positive for congestion, sinus pain and sore throat.   Respiratory:  Positive for cough, sputum production (was dry and now productive), shortness of breath and wheezing.   Cardiovascular:  Positive for chest pain (with cough).  Gastrointestinal:  Positive for diarrhea (worse than usual).  Musculoskeletal:  Positive for myalgias (not bad today).  Neurological:  Positive for headaches.     Social History   Socioeconomic History   Marital status: Widowed    Spouse name: Not on file   Number of children: 1   Years of education: Not on file   Highest education level: Not on file  Occupational History   Occupation: retired - Oceanographer: RETIRED  Tobacco Use   Smoking status: Never   Smokeless tobacco: Never  Substance and  Sexual Activity   Alcohol use: No    Alcohol/week: 0.0 standard drinks   Drug use: No   Sexual activity: Not on file  Other Topics Concern   Not on file  Social History Narrative   Recently widowed--husband died in 08-Dec-2018.      Now lives alone but her son usually stays with her at night.  During the day she has 2 nephews who live nearby to come in and check on her intermittently.  Also one of her nieces calls routinely.      When her son is home and awake, she may try to walk on a treadmill, but is scared to walk outside.   Social Determinants of Health   Financial Resource Strain: Not on file  Food Insecurity: Not on file  Transportation Needs: Not on file  Physical Activity: Not on file  Stress: Not on file  Social Connections: Not on file        Assessment and Plan:  See Problem List for Assessment and Plan of chronic medical problems.   Follow Up Instructions:    I discussed the assessment and treatment plan with the patient. The patient was provided an opportunity to ask questions and all were answered. The patient agreed with the plan and demonstrated an understanding of the instructions.   The patient was advised to call back or seek an in-person evaluation if the  symptoms worsen or if the condition fails to improve as anticipated.  Time spent on telephone call - 15 minutes  Pincus Sanes, MD

## 2020-09-21 ENCOUNTER — Encounter: Payer: Self-pay | Admitting: Internal Medicine

## 2020-09-21 ENCOUNTER — Telehealth (INDEPENDENT_AMBULATORY_CARE_PROVIDER_SITE_OTHER): Payer: Medicare Other | Admitting: Internal Medicine

## 2020-09-21 DIAGNOSIS — J209 Acute bronchitis, unspecified: Secondary | ICD-10-CM

## 2020-09-21 MED ORDER — DOXYCYCLINE HYCLATE 100 MG PO TABS: 100.0000 mg | ORAL_TABLET | Freq: Two times a day (BID) | ORAL | 0 refills | Status: DC

## 2020-09-21 MED ORDER — BENZONATATE 200 MG PO CAPS: 200.0000 mg | ORAL_CAPSULE | Freq: Three times a day (TID) | ORAL | 0 refills | Status: DC | PRN

## 2020-09-21 NOTE — Assessment & Plan Note (Signed)
Acute covid test neg x 2 at home Likely bacterial  Start doxycycline 100 mg BID x 10 day Tessalon perles 200 mg tid prn for cough otc cold medications Rest, fluid Call if no improvement - stressed if she is not feeling better we may need to see her in person or get her to urgent care

## 2020-09-23 ENCOUNTER — Ambulatory Visit: Payer: Self-pay | Admitting: Neurology

## 2020-09-23 ENCOUNTER — Inpatient Hospital Stay: Payer: Self-pay | Admitting: Adult Health

## 2020-09-23 ENCOUNTER — Other Ambulatory Visit (HOSPITAL_COMMUNITY): Payer: Medicare Other

## 2020-09-27 ENCOUNTER — Inpatient Hospital Stay (HOSPITAL_COMMUNITY)
Admission: EM | Admit: 2020-09-27 | Discharge: 2020-10-01 | DRG: 286 | Disposition: A | Discharge status: Home Health | Payer: Medicare Other | Attending: Internal Medicine | Admitting: Internal Medicine

## 2020-09-27 ENCOUNTER — Encounter (HOSPITAL_COMMUNITY): Payer: Self-pay | Admitting: Emergency Medicine

## 2020-09-27 ENCOUNTER — Other Ambulatory Visit: Payer: Self-pay

## 2020-09-27 ENCOUNTER — Emergency Department (HOSPITAL_COMMUNITY): Payer: Medicare Other

## 2020-09-27 DIAGNOSIS — I110 Hypertensive heart disease with heart failure: Secondary | ICD-10-CM | POA: Diagnosis not present

## 2020-09-27 DIAGNOSIS — E1165 Type 2 diabetes mellitus with hyperglycemia: Secondary | ICD-10-CM | POA: Diagnosis present

## 2020-09-27 DIAGNOSIS — I428 Other cardiomyopathies: Secondary | ICD-10-CM | POA: Diagnosis not present

## 2020-09-27 DIAGNOSIS — N179 Acute kidney failure, unspecified: Secondary | ICD-10-CM | POA: Diagnosis not present

## 2020-09-27 DIAGNOSIS — Z6841 Body Mass Index (BMI) 40.0 and over, adult: Secondary | ICD-10-CM | POA: Diagnosis not present

## 2020-09-27 DIAGNOSIS — Z886 Allergy status to analgesic agent status: Secondary | ICD-10-CM

## 2020-09-27 DIAGNOSIS — I160 Hypertensive urgency: Secondary | ICD-10-CM | POA: Diagnosis present

## 2020-09-27 DIAGNOSIS — Z20822 Contact with and (suspected) exposure to covid-19: Secondary | ICD-10-CM | POA: Diagnosis present

## 2020-09-27 DIAGNOSIS — I480 Paroxysmal atrial fibrillation: Secondary | ICD-10-CM | POA: Diagnosis not present

## 2020-09-27 DIAGNOSIS — K219 Gastro-esophageal reflux disease without esophagitis: Secondary | ICD-10-CM | POA: Diagnosis present

## 2020-09-27 DIAGNOSIS — R911 Solitary pulmonary nodule: Secondary | ICD-10-CM | POA: Diagnosis present

## 2020-09-27 DIAGNOSIS — G4700 Insomnia, unspecified: Secondary | ICD-10-CM | POA: Diagnosis not present

## 2020-09-27 DIAGNOSIS — Z743 Need for continuous supervision: Secondary | ICD-10-CM | POA: Diagnosis not present

## 2020-09-27 DIAGNOSIS — E785 Hyperlipidemia, unspecified: Secondary | ICD-10-CM | POA: Diagnosis present

## 2020-09-27 DIAGNOSIS — J309 Allergic rhinitis, unspecified: Secondary | ICD-10-CM | POA: Diagnosis not present

## 2020-09-27 DIAGNOSIS — I509 Heart failure, unspecified: Secondary | ICD-10-CM

## 2020-09-27 DIAGNOSIS — I5033 Acute on chronic diastolic (congestive) heart failure: Secondary | ICD-10-CM | POA: Diagnosis not present

## 2020-09-27 DIAGNOSIS — E876 Hypokalemia: Secondary | ICD-10-CM | POA: Diagnosis present

## 2020-09-27 DIAGNOSIS — R0602 Shortness of breath: Secondary | ICD-10-CM | POA: Diagnosis not present

## 2020-09-27 DIAGNOSIS — Z8673 Personal history of transient ischemic attack (TIA), and cerebral infarction without residual deficits: Secondary | ICD-10-CM

## 2020-09-27 DIAGNOSIS — Z7901 Long term (current) use of anticoagulants: Secondary | ICD-10-CM | POA: Diagnosis not present

## 2020-09-27 DIAGNOSIS — I700 Atherosclerosis of aorta: Secondary | ICD-10-CM | POA: Diagnosis not present

## 2020-09-27 DIAGNOSIS — I2510 Atherosclerotic heart disease of native coronary artery without angina pectoris: Secondary | ICD-10-CM | POA: Diagnosis not present

## 2020-09-27 DIAGNOSIS — I272 Pulmonary hypertension, unspecified: Secondary | ICD-10-CM | POA: Diagnosis not present

## 2020-09-27 DIAGNOSIS — I252 Old myocardial infarction: Secondary | ICD-10-CM | POA: Diagnosis not present

## 2020-09-27 DIAGNOSIS — I350 Nonrheumatic aortic (valve) stenosis: Secondary | ICD-10-CM

## 2020-09-27 DIAGNOSIS — E1169 Type 2 diabetes mellitus with other specified complication: Secondary | ICD-10-CM | POA: Diagnosis present

## 2020-09-27 DIAGNOSIS — I4891 Unspecified atrial fibrillation: Secondary | ICD-10-CM | POA: Diagnosis not present

## 2020-09-27 DIAGNOSIS — I482 Chronic atrial fibrillation, unspecified: Secondary | ICD-10-CM | POA: Diagnosis present

## 2020-09-27 DIAGNOSIS — Z7984 Long term (current) use of oral hypoglycemic drugs: Secondary | ICD-10-CM

## 2020-09-27 DIAGNOSIS — R918 Other nonspecific abnormal finding of lung field: Secondary | ICD-10-CM | POA: Diagnosis not present

## 2020-09-27 DIAGNOSIS — Z8249 Family history of ischemic heart disease and other diseases of the circulatory system: Secondary | ICD-10-CM

## 2020-09-27 DIAGNOSIS — J811 Chronic pulmonary edema: Secondary | ICD-10-CM | POA: Diagnosis not present

## 2020-09-27 DIAGNOSIS — Z79899 Other long term (current) drug therapy: Secondary | ICD-10-CM

## 2020-09-27 DIAGNOSIS — Z833 Family history of diabetes mellitus: Secondary | ICD-10-CM

## 2020-09-27 DIAGNOSIS — R079 Chest pain, unspecified: Secondary | ICD-10-CM | POA: Diagnosis not present

## 2020-09-27 DIAGNOSIS — R0789 Other chest pain: Secondary | ICD-10-CM | POA: Diagnosis not present

## 2020-09-27 DIAGNOSIS — I517 Cardiomegaly: Secondary | ICD-10-CM | POA: Diagnosis not present

## 2020-09-27 DIAGNOSIS — M818 Other osteoporosis without current pathological fracture: Secondary | ICD-10-CM | POA: Diagnosis not present

## 2020-09-27 DIAGNOSIS — E1122 Type 2 diabetes mellitus with diabetic chronic kidney disease: Secondary | ICD-10-CM | POA: Diagnosis present

## 2020-09-27 DIAGNOSIS — T751XXA Unspecified effects of drowning and nonfatal submersion, initial encounter: Secondary | ICD-10-CM | POA: Diagnosis not present

## 2020-09-27 DIAGNOSIS — R6889 Other general symptoms and signs: Secondary | ICD-10-CM | POA: Diagnosis not present

## 2020-09-27 LAB — I-STAT CHEM 8, ED
BUN: 18 mg/dL (ref 8–23)
Calcium, Ion: 1.2 mmol/L (ref 1.15–1.40)
Chloride: 105 mmol/L (ref 98–111)
Creatinine, Ser: 0.7 mg/dL (ref 0.44–1.00)
Glucose, Bld: 106 mg/dL — ABNORMAL HIGH (ref 70–99)
HCT: 33 % — ABNORMAL LOW (ref 36.0–46.0)
Hemoglobin: 11.2 g/dL — ABNORMAL LOW (ref 12.0–15.0)
Potassium: 3.5 mmol/L (ref 3.5–5.1)
Sodium: 143 mmol/L (ref 135–145)
TCO2: 28 mmol/L (ref 22–32)

## 2020-09-27 LAB — CBC WITH DIFFERENTIAL/PLATELET
Abs Immature Granulocytes: 0.02 10*3/uL (ref 0.00–0.07)
Basophils Absolute: 0 10*3/uL (ref 0.0–0.1)
Basophils Relative: 0 %
Eosinophils Absolute: 0.1 10*3/uL (ref 0.0–0.5)
Eosinophils Relative: 2 %
HCT: 34.6 % — ABNORMAL LOW (ref 36.0–46.0)
Hemoglobin: 11.5 g/dL — ABNORMAL LOW (ref 12.0–15.0)
Immature Granulocytes: 0 %
Lymphocytes Relative: 24 %
Lymphs Abs: 1.6 10*3/uL (ref 0.7–4.0)
MCH: 32.2 pg (ref 26.0–34.0)
MCHC: 33.2 g/dL (ref 30.0–36.0)
MCV: 96.9 fL (ref 80.0–100.0)
Monocytes Absolute: 0.5 10*3/uL (ref 0.1–1.0)
Monocytes Relative: 7 %
Neutro Abs: 4.5 10*3/uL (ref 1.7–7.7)
Neutrophils Relative %: 67 %
Platelets: 270 10*3/uL (ref 150–400)
RBC: 3.57 MIL/uL — ABNORMAL LOW (ref 3.87–5.11)
RDW: 13.4 % (ref 11.5–15.5)
WBC: 6.7 10*3/uL (ref 4.0–10.5)
nRBC: 0 % (ref 0.0–0.2)

## 2020-09-27 LAB — COMPREHENSIVE METABOLIC PANEL
ALT: 16 U/L (ref 0–44)
AST: 21 U/L (ref 15–41)
Albumin: 3.3 g/dL — ABNORMAL LOW (ref 3.5–5.0)
Alkaline Phosphatase: 46 U/L (ref 38–126)
Anion gap: 10 (ref 5–15)
BUN: 15 mg/dL (ref 8–23)
CO2: 25 mmol/L (ref 22–32)
Calcium: 9.2 mg/dL (ref 8.9–10.3)
Chloride: 105 mmol/L (ref 98–111)
Creatinine, Ser: 0.8 mg/dL (ref 0.44–1.00)
GFR, Estimated: >60 mL/min (ref >60)
Glucose, Bld: 109 mg/dL — ABNORMAL HIGH (ref 70–99)
Potassium: 3.4 mmol/L — ABNORMAL LOW (ref 3.5–5.1)
Sodium: 140 mmol/L (ref 135–145)
Total Bilirubin: 1.7 mg/dL — ABNORMAL HIGH (ref 0.3–1.2)
Total Protein: 6.8 g/dL (ref 6.5–8.1)

## 2020-09-27 LAB — RESP PANEL BY RT-PCR (FLU A&B, COVID) ARPGX2
Influenza A by PCR: NEGATIVE
Influenza B by PCR: NEGATIVE
SARS Coronavirus 2 by RT PCR: NEGATIVE

## 2020-09-27 LAB — TROPONIN I (HIGH SENSITIVITY)
Troponin I (High Sensitivity): 77 ng/L — ABNORMAL HIGH (ref <18)
Troponin I (High Sensitivity): 74 ng/L — ABNORMAL HIGH (ref <18)

## 2020-09-27 LAB — BRAIN NATRIURETIC PEPTIDE: B Natriuretic Peptide: 398.1 pg/mL — ABNORMAL HIGH (ref 0.0–100.0)

## 2020-09-27 LAB — GLUCOSE, CAPILLARY
Glucose-Capillary: 143 mg/dL — ABNORMAL HIGH (ref 70–99)
Glucose-Capillary: 110 mg/dL — ABNORMAL HIGH (ref 70–99)

## 2020-09-27 IMAGING — DX DG CHEST 1V PORT
1 series · 1 of 1 positions shown · non-contrast
Comparison: Portable exam [PA] hours compared to [DATE]

CLINICAL DATA: Shortness of breath for 2 days, history coronary
artery disease post MI, hypertension, type II diabetes mellitus

EXAM:
PORTABLE CHEST 1 VIEW

[chest]
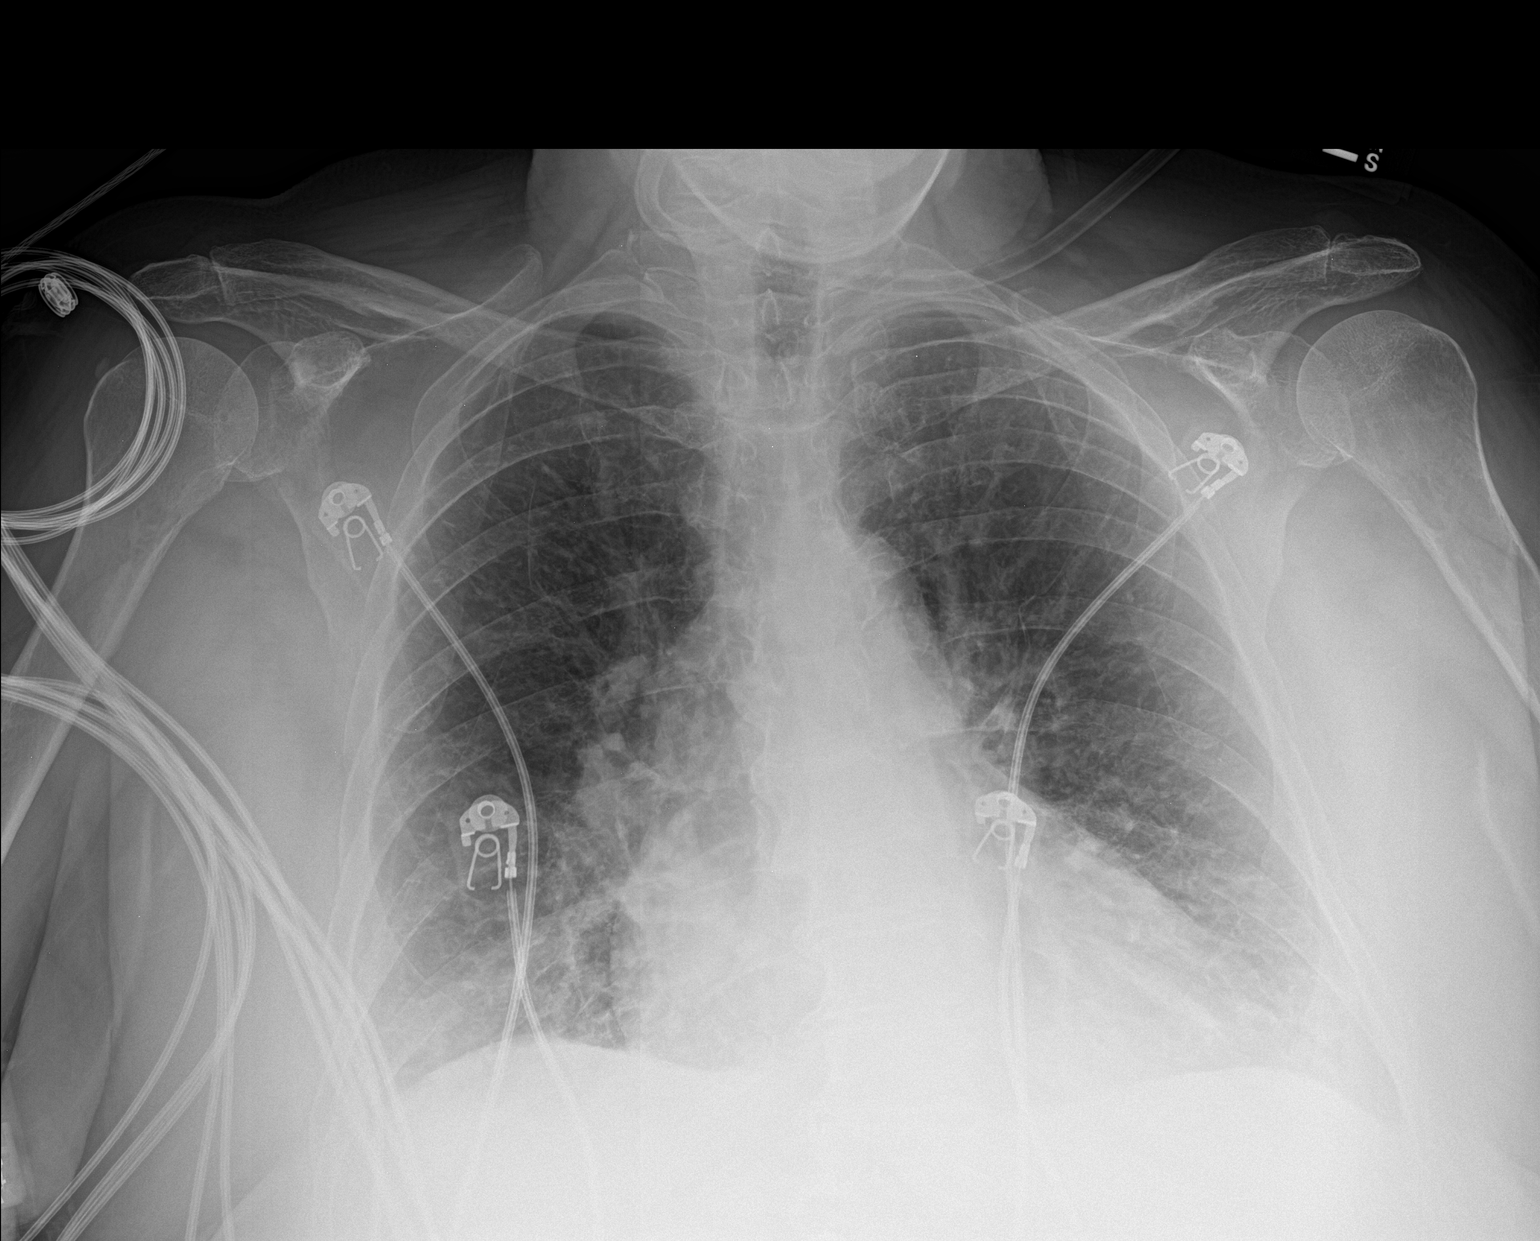

[1 of 1 positions shown; findings below may reference images not displayed]

FINDINGS: Enlargement of cardiac silhouette with pulmonary vascular
congestion.

Mediastinal contours normal.

Mild interstitial prominence in lower lungs, question due to
hypoinflation or minimal edema.

No pleural effusion, segmental consolidation, or pneumothorax.

Bones demineralized.
IMPRESSION: Enlargement of cardiac silhouette with pulmonary vascular
congestion.

Interstitial prominence in the lower lungs question due to
hypoinflation or minimal pulmonary edema.

## 2020-09-27 MED ORDER — ACETAMINOPHEN 650 MG RE SUPP: 650.0000 mg | Freq: Four times a day (QID) | RECTAL | Status: DC | PRN

## 2020-09-27 MED ORDER — PANTOPRAZOLE SODIUM 40 MG PO TBEC
40.0000 mg | DELAYED_RELEASE_TABLET | Freq: Every day | ORAL | Status: DC
  Administered 2020-09-27 – 2020-10-01 (×5): 40 mg via ORAL
  Filled 2020-09-27 (×5): qty 1

## 2020-09-27 MED ORDER — SODIUM CHLORIDE 0.9% FLUSH
3.0000 mL | Freq: Two times a day (BID) | INTRAVENOUS | Status: DC
  Administered 2020-09-27 – 2020-09-30 (×7): 3 mL via INTRAVENOUS

## 2020-09-27 MED ORDER — FUROSEMIDE 10 MG/ML IJ SOLN
40.0000 mg | Freq: Two times a day (BID) | INTRAMUSCULAR | Status: DC
  Administered 2020-09-27 – 2020-09-30 (×6): 40 mg via INTRAVENOUS
  Filled 2020-09-27 (×6): qty 4

## 2020-09-27 MED ORDER — OXYCODONE HCL 5 MG PO TABS
5.0000 mg | ORAL_TABLET | ORAL | Status: DC | PRN
  Administered 2020-09-28: 5 mg via ORAL
  Filled 2020-09-27: qty 1

## 2020-09-27 MED ORDER — ONDANSETRON HCL 4 MG/2ML IJ SOLN
4.0000 mg | Freq: Four times a day (QID) | INTRAMUSCULAR | Status: DC | PRN
  Administered 2020-09-28: 4 mg via INTRAVENOUS
  Filled 2020-09-27: qty 2

## 2020-09-27 MED ORDER — INSULIN ASPART 100 UNIT/ML IJ SOLN
0.0000 [IU] | Freq: Three times a day (TID) | INTRAMUSCULAR | Status: DC
  Administered 2020-09-27: 2 [IU] via SUBCUTANEOUS
  Administered 2020-09-28: 3 [IU] via SUBCUTANEOUS
  Administered 2020-09-29: 2 [IU] via SUBCUTANEOUS
  Administered 2020-09-30: 3 [IU] via SUBCUTANEOUS

## 2020-09-27 MED ORDER — HYDRALAZINE HCL 20 MG/ML IJ SOLN
5.0000 mg | INTRAMUSCULAR | Status: DC | PRN
  Administered 2020-09-27 (×2): 5 mg via INTRAVENOUS
  Filled 2020-09-27 (×2): qty 1

## 2020-09-27 MED ORDER — APIXABAN 5 MG PO TABS
5.0000 mg | ORAL_TABLET | Freq: Two times a day (BID) | ORAL | Status: DC
  Administered 2020-09-27 – 2020-09-28 (×4): 5 mg via ORAL
  Filled 2020-09-27 (×4): qty 1

## 2020-09-27 MED ORDER — CARVEDILOL 12.5 MG PO TABS
12.5000 mg | ORAL_TABLET | Freq: Two times a day (BID) | ORAL | Status: DC
  Administered 2020-09-27: 12.5 mg via ORAL
  Filled 2020-09-27: qty 1

## 2020-09-27 MED ORDER — BISACODYL 5 MG PO TBEC
5.0000 mg | DELAYED_RELEASE_TABLET | Freq: Every day | ORAL | Status: DC | PRN
  Filled 2020-09-27: qty 1

## 2020-09-27 MED ORDER — FUROSEMIDE 10 MG/ML IJ SOLN: 80.0000 mg | Freq: Once | INTRAMUSCULAR | Status: DC

## 2020-09-27 MED ORDER — DOCUSATE SODIUM 100 MG PO CAPS
100.0000 mg | ORAL_CAPSULE | Freq: Two times a day (BID) | ORAL | Status: DC
  Administered 2020-09-30: 100 mg via ORAL
  Filled 2020-09-27 (×6): qty 1

## 2020-09-27 MED ORDER — MORPHINE SULFATE (PF) 2 MG/ML IV SOLN
2.0000 mg | INTRAVENOUS | Status: DC | PRN
Start: 2020-09-27 — End: 2020-10-01

## 2020-09-27 MED ORDER — TRAZODONE HCL 50 MG PO TABS
25.0000 mg | ORAL_TABLET | Freq: Every evening | ORAL | Status: DC | PRN
  Administered 2020-09-28 – 2020-09-30 (×3): 25 mg via ORAL
  Filled 2020-09-27 (×3): qty 1

## 2020-09-27 MED ORDER — CARVEDILOL 6.25 MG PO TABS
6.2500 mg | ORAL_TABLET | Freq: Two times a day (BID) | ORAL | Status: DC
  Administered 2020-09-27 – 2020-10-01 (×8): 6.25 mg via ORAL
  Filled 2020-09-27 (×8): qty 1

## 2020-09-27 MED ORDER — POLYETHYLENE GLYCOL 3350 17 G PO PACK: 17.0000 g | PACK | Freq: Every day | ORAL | Status: DC | PRN

## 2020-09-27 MED ORDER — ATORVASTATIN CALCIUM 40 MG PO TABS
40.0000 mg | ORAL_TABLET | Freq: Every day | ORAL | Status: DC
  Administered 2020-09-27 – 2020-10-01 (×5): 40 mg via ORAL
  Filled 2020-09-27 (×5): qty 1

## 2020-09-27 MED ORDER — ONDANSETRON HCL 4 MG PO TABS: 4.0000 mg | ORAL_TABLET | Freq: Four times a day (QID) | ORAL | Status: DC | PRN

## 2020-09-27 MED ORDER — BENZONATATE 100 MG PO CAPS: 200.0000 mg | ORAL_CAPSULE | Freq: Three times a day (TID) | ORAL | Status: DC | PRN

## 2020-09-27 MED ORDER — FUROSEMIDE 10 MG/ML IJ SOLN
40.0000 mg | Freq: Once | INTRAMUSCULAR | Status: AC
  Administered 2020-09-27: 40 mg via INTRAVENOUS
  Filled 2020-09-27: qty 4

## 2020-09-27 MED ORDER — ACETAMINOPHEN 325 MG PO TABS
650.0000 mg | ORAL_TABLET | Freq: Four times a day (QID) | ORAL | Status: DC | PRN
  Administered 2020-09-27 – 2020-09-30 (×2): 650 mg via ORAL
  Filled 2020-09-27 (×3): qty 2

## 2020-09-27 NOTE — Progress Notes (Signed)
Nutrition Brief Note  RD consulted for assessment of nutritional requirements/ status.   Wt Readings from Last 15 Encounters:  09/27/20 103 kg  08/23/20 95.1 kg  08/19/20 94.5 kg  08/12/20 94.9 kg  03/18/20 95.7 kg  09/22/19 97.5 kg  09/16/19 99.3 kg  08/21/19 98.4 kg  03/19/19 96.2 kg  03/06/19 96.7 kg  09/13/18 96.2 kg  08/05/18 96.9 kg  03/14/18 95.3 kg  01/17/18 93.6 kg  11/12/17 95.7 kg   Mary Mccarthy is a 80 y.o. female with medical history significant of CAD; HTN; HLD; afib on Eliquis; morbid obesity; and DM presenting with SOB and chest tightness.  She reports that she had "flu" last week - congestion, cough and tested negative for COVID.  Her physician called in Doxy, Tessalon, and OTC Mucinex.  She was feeling better by the end of the week.  On Saturday, she felt poorly again.  This time, she felt very volume overloaded and SOB with cough.  She awoke this AM with chest heaviness and ongoing SOB and so called 911.  Pt admitted with acute on chronic CHF.   Spoke with pt at bedside, who reports her appetite has improved since admission. Per her report, intake has waxed and waned over the past 6 weeks. She typically consumes 2 meals per day (Breakfast: bowl of cereal and Dinner: meat, starch, and vegetable). Pt also snacks on jello occasionally.   Pt denies any weight loss. She shares her UBW is around 210#. She weighs herself daily. RD reinforced importance of sodium restriction and weighing self daily. Pt able to teach back to this RD when to contact MD regarding wt changes.   Medications reviewed and include colace and lasix.   Lab Results  Component Value Date   HGBA1C 7.4 (H) 08/10/2020   PTA DM medications are 1000 mg metformin daily, 500 mg metformin daily, and 2 mg glimepiride daily.   Labs reviewed. Inpatient orders for glycemic control are 0-15 units insulin aspart TID with meals.   Body mass index is 42.91 kg/m. Patient meets criteria for morbid obesity,  class III based on current BMI. Obesity is a complex, chronic medical condition that is optimally managed by a multidisciplinary care team. Weight loss is not an ideal goal for an acute inpatient hospitalization. However, if further work-up for obesity is warranted, consider outpatient referral to outpatient bariatric service and/or [facility].    Current diet order is heart healthy/ carb modified with 1.5 L fluid restriction, patient is consuming approximately n/a% of meals at this time. Labs and medications reviewed.   No nutrition interventions warranted at this time. If nutrition issues arise, please consult RD.   Levada Schilling, RD, LDN, CDCES Registered Dietitian II Certified Diabetes Care and Education Specialist Please refer to [facility] for RD and/or RD on-call/weekend/after hours pager

## 2020-09-27 NOTE — ED Provider Notes (Signed)
[facility] EMERGENCY DEPARTMENT Provider Note   CSN: [id number] Arrival date & time: 09/27/20  6644     History Chief Complaint  Patient presents with   Shortness of Breath   Hypertension    Mary Mccarthy is a 80 y.o. female.  Patient is a 80 year old female with a history of atrial fibrillation on Eliquis, coronary artery disease, hypertension, hyperlipidemia, CHF, aortic stenosis, diabetes.  She presents with shortness of breath.  She states that about a week and a half ago she was diagnosed with a flulike illness.  She was started on doxycycline at that time.  She says she has been weak from that but overall has been doing okay.  Last night about 3 AM she noticed she had some increased shortness of breath.  She had some associated tightness across her chest.  She felt like she was full of fluid.  She has had some increased leg swelling over the last 3 to 4 days.  She says actually this morning is a little bit better but comes and goes.  She has felt febrile but has not checked her temperature.  She is not on baseline oxygen.  She denies any nausea or vomiting.  She does have a headache around the back of her head.  She says it feels like a band.  She has been having some headaches since she was admitted in July from a stroke.  She says this headache is a little bit different than she had had before.  She did take some Tylenol prior to arrival.  She also was given aspirin and 2 nitroglycerin by EMS prior to arrival.  She was started on CPAP but was transitioned to a nonrebreather mask on arrival.      Past Medical History:  Diagnosis Date   ALLERGIC RHINITIS 06/24/2007   Anemia    AVM (arteriovenous malformation) of colon    Blood transfusion without reported diagnosis 05/2015   COLONIC POLYPS, HX OF 06/24/2007   Coronary artery disease, non-occlusive 05/2015   Trop + w/ Acute Anemia =>CATH: small RI - Ostial 60%, ostial RCA 30% and dLAD 40-50%;; 9/'21: Cor Ca Score  624. Mild (25-49%) prox RCA & LAD,; Moderate (50-69%) Ostial Small RI & prox LCx.     DEPRESSION 10/08/2006   DIABETES MELLITUS, TYPE II 10/05/2006   GERD 06/24/2007   HYPERLIPIDEMIA 10/08/2006   HYPERTENSION 10/08/2006   INSOMNIA 09/24/2008   Left knee DJD    Moderate to severe aortic stenosis 05/2015   a) Mod AS (mean gras 22 mmHg, peak 42 mmHg -AVA 1.1-1.2 cm.);b) 04/2017: Severe Ca2+ AoV w/ Mod-Severe AS (p-m grad 60-34 mmHg -Moderate), calculated AVA 0.8 cm -Severe;;c) 03/2019: Mod AS (m grad 31 mmHg); d) 09/2019: Mod AS (m grad 34.5 mmHg).   Myocardial infarction ([place]) 05/2015   Type 2 MI - related to GI Bleed / Anemia (non-obstructive CAD by Cath)   Osteoporosis 03/10/2016   PEPTIC ULCER DISEASE 10/08/2006    Patient Active Problem List   Diagnosis Date Noted   Acute on chronic diastolic CHF (congestive heart failure) ([place]) 09/27/2020   Acute bronchitis 09/21/2020   Paroxysmal atrial fibrillation ([place]) 08/23/2020   Secondary hypercoagulable state ([place]) 08/23/2020   Atrial fibrillation with RVR ([place]) 08/11/2020   History of stroke 08/10/2020   Hypomagnesemia 08/10/2020   Aortic atherosclerosis ([place]) 03/18/2020   Chest pain at rest 09/22/2019   Vitamin D deficiency 09/21/2019   B12 deficiency 09/21/2019   COVID-19 virus vaccination not done  03/06/2019   Cough 03/14/2018   Wheezing 03/14/2018   Anxiety 09/11/2017   Severe aortic stenosis 03/14/2017   Osteoporosis 03/10/2016   Coronary artery disease, non-occlusive 05/24/2015   Chronic diastolic CHF (congestive heart failure) ([place])    Pulmonary hypertension ([facility])    Chronic diastolic heart failure ([place]) 05/20/2015   Gastric ulceration    AVM (arteriovenous malformation) of colon    Demand ischemia of myocardium ([place])    Viral illness 11/20/2014   Right knee pain 05/21/2014   Anemia, iron deficiency 05/20/2013   Encounter for well adult exam with abnormal findings 06/19/2010   MENOPAUSAL DISORDER 12/07/2009   INSOMNIA 09/24/2008    ALLERGIC RHINITIS 06/24/2007   GERD 06/24/2007   COLONIC POLYPS, HX OF 06/24/2007   Hyperlipidemia with target LDL less than 70 10/08/2006   Depression 10/08/2006   Essential hypertension 10/08/2006   PEPTIC ULCER DISEASE 10/08/2006   Type 2 diabetes mellitus with hyperglycemia, without long-term current use of insulin ([place]) 10/05/2006   Morbid obesity ([place]) 10/05/2006    Past Surgical History:  Procedure Laterality Date   APPENDECTOMY     BREAST BIOPSY     CARDIAC CATHETERIZATION N/A 05/21/2015   Procedure: Left Heart Cath and Coronary Angiography;  Surgeon: Leonie Man, MD;  Location: [facility];  Service: Cardiovascular;: Ost RI 60%, Ost RCA 30%, dLAD tapers to small vessel w/ 40-50%. Mildly elevated LVEDP. Normal LV Fxn.   COLONOSCOPY N/A 05/20/2015   Procedure: COLONOSCOPY;  Surgeon: Manus Gunning, MD;  Location: [place] [facility];  Service: Gastroenterology;  Laterality: N/A;   CORONARY CA2+ SCORE / CARDIAC CT ANGIOGRAM  10/09/2019   Calcium score 624.  82nd percentile. Dominant RCA: Mild (25-49%) proximal stenosis-distal bifurcation into PDA and PAV--< RPL branches.  LAD (1 major mid vessel diagonal) diffuse calcified plaque, mild proximal stenosis with minimal distal stenosis.  Small RI moderate ostial disease.  LCx-moderate mixed (50-69%) proximal stenosis.  Small dOM1 disease.  Trileaflet AoV, annular Ca2+ - probable AS   ESOPHAGOGASTRODUODENOSCOPY N/A 05/20/2015   Procedure: ESOPHAGOGASTRODUODENOSCOPY (EGD);  Surgeon: Manus Gunning, MD;  Location: [facility];  Service: Gastroenterology;  Laterality: N/A;   TONSILLECTOMY     TRANSTHORACIC ECHOCARDIOGRAM  03/2019; 09/2019   a) EF 60 to 65%.  Moderate LVH.  GRII DD.  Mod-Severe AS (m grad 36 mmHg, peak 59 mmHg); b) EF 65-70%, No RWMA. Gr1 DD/hi LAP, Mild hi PAP. Mod LA Dil. MOD AS (mean Grad 34.5 mmHg).  STABLE    TRANSTHORACIC ECHOCARDIOGRAM  04/12/2017   EF 60-65%. No RWMA.  GR 1 DD.  Moderate concentric  LVH.  Mild LA dilation. Severe calcified aortic valve with moderate-severe aortic stenosis (peak/mean gradients 60/34 mmHg) and in severe range by AVA (0.8 cm2).  Mild to moderately increased PA pressures 41 mmHg with normal RV size and function..     TUBAL LIGATION       OB History   No obstetric history on file.     Family History  Problem Relation Age of Onset   Alcohol abuse Other    Arthritis Other        DJD   Hyperlipidemia Other    Heart disease Other    Stroke Other    Hypertension Other    Depression Other    Diabetes Other    Cancer Mother        ENT cancer   CAD Sister        Several MI & PPM; long term  smoker, EtOH   Heart attack Sister 70       several   Arrhythmia Sister        s/p PPM (Dr. Sallyanne Kuster)    Social History   Tobacco Use   Smoking status: Never   Smokeless tobacco: Never  Substance Use Topics   Alcohol use: No    Alcohol/week: 0.0 standard drinks   Drug use: No    Home Medications Prior to Admission medications   Medication Sig Start Date End Date Taking? Authorizing Provider  acetaminophen (TYLENOL) 500 MG tablet Take 500 mg by mouth every 6 (six) hours as needed for moderate pain.   Yes [provider]  apixaban (ELIQUIS) 5 MG TABS tablet Take 1 tablet (5 mg total) by mouth 2 (two) times daily. 08/19/20  Yes Biagio Borg, MD  atorvastatin (LIPITOR) 40 MG tablet TAKE 1 TABLET BY MOUTH EVERY DAY Patient taking differently: Take 40 mg by mouth daily. 08/20/20  Yes Leonie Man, MD  benzonatate (TESSALON) 200 MG capsule Take 1 capsule (200 mg total) by mouth 3 (three) times daily as needed for cough. 09/21/20  Yes Burns, Claudina Lick, MD  carvedilol (COREG) 6.25 MG tablet TAKE 1 TABLET BY MOUTH TWICE A DAY Patient taking differently: Take 6.25 mg by mouth 2 (two) times daily with a meal. 02/26/20  Yes Leonie Man, MD  cyanocobalamin ([employer] VITAMIN B12) 1000 MCG tablet Take 1 tablet (1,000 mcg total) by mouth daily. 07/14/20  Yes Biagio Borg, MD  doxycycline (VIBRA-TABS) 100 MG tablet Take 1 tablet (100 mg total) by mouth 2 (two) times daily. 09/21/20  Yes Burns, Claudina Lick, MD  glimepiride (AMARYL) 2 MG tablet TAKE 1 TABLET BY MOUTH EVERY DAY BEFORE BREAKFAST Patient taking differently: Take 2 mg by mouth daily with breakfast. 09/16/20  Yes Biagio Borg, MD  metFORMIN (GLUCOPHAGE) 1000 MG tablet TAKE 1 TAB BY MOUTH IN THE AM,AND 1/2 TAB IN THE PM Patient taking differently: Take 500-1,000 mg by mouth 2 (two) times daily with a meal. Take 1062m in the AM and 500 in the PM 07/14/20  Yes JBiagio Borg MD  pantoprazole (PROTONIX) 40 MG tablet Take 1 tablet (40 mg total) by mouth daily. 07/14/20  Yes JBiagio Borg MD  blood glucose meter kit and supplies Dispense based on patient and insurance preference. Use up to four times daily as directed. E11.9 04/15/18   JBiagio Borg MD  Blood Glucose Monitoring Suppl (ONE TOUCH ULTRA 2) w/Device KIT Use as directed daily 06/01/15   JBiagio Borg MD  Lancets (ONETOUCH DELICA PLUS [id number] [place]USE AS DIRECTED TEST 1-2 TIMES A DAY 09/05/20   JBiagio Borg MD  OM[facility] HealthcareULTRA test strip USE AS INSTRUCTED TEST 1-2 TIMES A DAY 07/28/19   JBiagio Borg MD  PFIZER-BIONT COVID-19 VAC-TRIS SUSP injection  05/12/20   [provider]    Allergies    Ibuprofen  Review of Systems   Review of Systems  Constitutional:  Positive for fatigue. Negative for chills, diaphoresis and fever.  HENT:  Negative for congestion, rhinorrhea and sneezing.   Eyes: Negative.   Respiratory:  Positive for cough, chest tightness and shortness of breath.   Cardiovascular:  Positive for leg swelling. Negative for chest pain.  Gastrointestinal:  Negative for abdominal pain, blood in stool, diarrhea, nausea and vomiting.  Genitourinary:  Negative for difficulty urinating, flank pain, frequency and hematuria.  Musculoskeletal:  Negative for arthralgias and back  pain.  Skin:  Negative for rash.  Neurological:   Positive for headaches. Negative for dizziness, speech difficulty, weakness and numbness.   Physical Exam Updated Vital Signs BP (!) 166/106 (BP Location: Left Arm)   Pulse 70   Temp 97.6 F (36.4 C) (Oral)   Resp 20   Ht _0  (1.549 m)   Wt 91.2 kg   SpO2 99%   BMI 38.00 kg/m   Physical Exam Constitutional:      Appearance: She is well-developed.  HENT:     Head: Normocephalic and atraumatic.  Eyes:     Pupils: Pupils are equal, round, and reactive to light.  Cardiovascular:     Rate and Rhythm: Normal rate and regular rhythm.     Heart sounds: Murmur heard.  Pulmonary:     Effort: Pulmonary effort is normal. No respiratory distress.     Breath sounds: Rales present. No wheezing.     Comments: Few crackles in bases, no increased work of breathing Chest:     Chest wall: No tenderness.  Abdominal:     General: Bowel sounds are normal.     Palpations: Abdomen is soft.     Tenderness: There is no abdominal tenderness. There is no guarding or rebound.  Musculoskeletal:        General: Normal range of motion.     Cervical back: Normal range of motion and neck supple.     Comments: 1+ pitting edema to lower extremities bilaterally  Lymphadenopathy:     Cervical: No cervical adenopathy.  Skin:    General: Skin is warm and dry.     Findings: No rash.  Neurological:     Mental Status: She is alert and oriented to person, place, and time.    ED Results / Procedures / Treatments   Labs (all labs ordered are listed, but only abnormal results are displayed) Labs Reviewed  COMPREHENSIVE METABOLIC PANEL - Abnormal; Notable for the following components:      Result Value   Potassium 3.4 (*)    Glucose, Bld 109 (*)    Albumin 3.3 (*)    Total Bilirubin 1.7 (*)    All other components within normal limits  BRAIN NATRIURETIC PEPTIDE - Abnormal; Notable for the following components:   B Natriuretic Peptide 398.1 (*)    All other components within normal limits  CBC WITH  DIFFERENTIAL/PLATELET - Abnormal; Notable for the following components:   RBC 3.57 (*)    Hemoglobin 11.5 (*)    HCT 34.6 (*)    All other components within normal limits  GLUCOSE, CAPILLARY - Abnormal; Notable for the following components:   Glucose-Capillary 143 (*)    All other components within normal limits  BASIC METABOLIC PANEL - Abnormal; Notable for the following components:   Potassium 3.3 (*)    Glucose, Bld 127 (*)    All other components within normal limits  CBC - Abnormal; Notable for the following components:   RBC 3.41 (*)    Hemoglobin 11.1 (*)    HCT 32.7 (*)    All other components within normal limits  GLUCOSE, CAPILLARY - Abnormal; Notable for the following components:   Glucose-Capillary 110 (*)    All other components within normal limits  GLUCOSE, CAPILLARY - Abnormal; Notable for the following components:   Glucose-Capillary 117 (*)    All other components within normal limits  MAGNESIUM - Abnormal; Notable for the following components:   Magnesium 0.8 (*)    All  other components within normal limits  GLUCOSE, CAPILLARY - Abnormal; Notable for the following components:   Glucose-Capillary 204 (*)    All other components within normal limits  I-STAT CHEM 8, ED - Abnormal; Notable for the following components:   Glucose, Bld 106 (*)    Hemoglobin 11.2 (*)    HCT 33.0 (*)    All other components within normal limits  TROPONIN I (HIGH SENSITIVITY) - Abnormal; Notable for the following components:   Troponin I (High Sensitivity) 77 (*)    All other components within normal limits  TROPONIN I (HIGH SENSITIVITY) - Abnormal; Notable for the following components:   Troponin I (High Sensitivity) 74 (*)    All other components within normal limits  RESP PANEL BY RT-PCR (FLU A&B, COVID) [id number]    EKG EKG Interpretation  Date/Time:  Monday September 27 2020 06:46:32 EDT Ventricular Rate:  67 PR Interval:  150 QRS Duration: 95 QT Interval:  437 QTC  Calculation: 462 R Axis:   42 Text Interpretation: Sinus rhythm Atrial premature complex Borderline ST depression, lateral leads Minimal ST elevation, anterior leads since last tracing no significant change Confirmed by Malvin Johns [id number]) on 09/27/2020 7:18:47 AM  Radiology DG Chest Port 1 View  Result Date: 09/27/2020 CLINICAL DATA:  Shortness of breath for 2 days, history coronary artery disease post MI, hypertension, type II diabetes mellitus EXAM: PORTABLE CHEST 1 VIEW COMPARISON:  Portable exam 0741 hours compared to 08/10/2020 FINDINGS: Enlargement of cardiac silhouette with pulmonary vascular congestion. Mediastinal contours normal. Mild interstitial prominence in lower lungs, question due to hypoinflation or minimal edema. No pleural effusion, segmental consolidation, or pneumothorax. Bones demineralized. IMPRESSION: Enlargement of cardiac silhouette with pulmonary vascular congestion. Interstitial prominence in the lower lungs question due to hypoinflation or minimal pulmonary edema. Electronically Signed   By: Lavonia Dana M.D.   On: 09/27/2020 08:09    Procedures Procedures   Medications Ordered in ED Medications  atorvastatin (LIPITOR) tablet 40 mg (40 mg Oral Given 09/28/20 1007)  carvedilol (COREG) tablet 6.25 mg (6.25 mg Oral Given 09/28/20 1006)  pantoprazole (PROTONIX) EC tablet 40 mg (40 mg Oral Given 09/28/20 1007)  apixaban (ELIQUIS) tablet 5 mg (5 mg Oral Given 09/28/20 1007)  benzonatate (TESSALON) capsule 200 mg (has no administration in time range)  insulin aspart (novoLOG) injection 0-15 Units (0 Units Subcutaneous Not Given 09/28/20 0600)  furosemide (LASIX) injection 40 mg (40 mg Intravenous Given 09/28/20 1007)  sodium chloride flush (NS) 0.9 % injection 3 mL (3 mLs Intravenous Given 09/28/20 1019)  acetaminophen (TYLENOL) tablet 650 mg (650 mg Oral Given 09/27/20 2205)    Or  acetaminophen (TYLENOL) suppository 650 mg ( Rectal See Alternative 09/27/20 2205)  oxyCODONE  (Oxy IR/ROXICODONE) immediate release tablet 5 mg (5 mg Oral Given 09/28/20 0124)  morphine 2 MG/ML injection 2 mg (has no administration in time range)  traZODone (DESYREL) tablet 25 mg (has no administration in time range)  docusate sodium (COLACE) capsule 100 mg (100 mg Oral Not Given 09/28/20 1007)  polyethylene glycol (MIRALAX / GLYCOLAX) packet 17 g (has no administration in time range)  bisacodyl (DULCOLAX) EC tablet 5 mg (has no administration in time range)  ondansetron (ZOFRAN) tablet 4 mg ( Oral See Alternative 09/28/20 0845)    Or  ondansetron (ZOFRAN) injection 4 mg (4 mg Intravenous Given 09/28/20 0845)  ALPRAZolam (XANAX) tablet 0.25 mg (has no administration in time range)  hydrALAZINE (APRESOLINE) injection 10 mg (has no administration in time range)  potassium chloride (KLOR-CON) packet 40 mEq (40 mEq Oral Not Given 09/28/20 1007)  magnesium sulfate IVPB 2 g 50 mL (2 g Intravenous New Bag/Given 09/28/20 0956)  furosemide (LASIX) injection 40 mg (40 mg Intravenous Given 09/27/20 0827)    ED Course  I have reviewed the triage vital signs and the nursing notes.  Pertinent labs & imaging results that were available during my care of the patient were reviewed by me and considered in my medical decision making (see chart for details).    MDM Rules/Calculators/A&P                           Patient is a 80 year old female who presents with shortness of breath.  She initially was on CPAP but was transitioned to a nonrebreather mask and then that was transitioned to a nasal cannula.  Chest x-ray shows evidence of pulmonary edema.  She has a mildly elevated troponin but stable, not trending upward.  She was given a dose of Lasix and has had some improvement in symptoms.  No ischemic changes on EKG.  Will be admitted for further treatment.  I spoke with Dr. Lorin Mercy with the hospitalist service. Final Clinical Impression(s) / ED Diagnoses Final diagnoses:  Acute on chronic congestive heart  failure, unspecified heart failure type [facility])    Rx / DC Orders ED Discharge Orders     None        Malvin Johns, MD 09/28/20 1053

## 2020-09-27 NOTE — H&P (Signed)
History and Physical    Mary Mccarthy NTI:[id number] DOB: 06/11/1940 DOA: 09/27/2020  PCP: Biagio Borg, MD Consultants:  [facility] - cardiology Patient coming from:  Home - lives with son; NOK:  Niece, Micheal Likens, [phone]  Chief Complaint: SOB, chest tightness  HPI: Mary Mccarthy is a 80 y.o. female with medical history significant of CAD; HTN; HLD; afib on Eliquis; morbid obesity; and DM presenting with SOB and chest tightness.  She reports that she had "flu" last week - congestion, cough and tested negative for COVID.  Her physician called in Doxy, Tessalon, and OTC Mucinex.  She was feeling better by the end of the week.  On Saturday, she felt poorly again.  This time, she felt very volume overloaded and SOB with cough.  She awoke this AM with chest heaviness and ongoing SOB and so called 911.  She was previously being considered for a loop recorder but was noted to have afib during hospitalization last month and so this is no longer needed.  Meanwhile, she was found to have severe AS.  She was due to see cardiology about this last week but was under the weather and so is rescheduled for this coming Friday.  Review of cardiology notes confirms that there is concern for progression to symptomatic afib and planning for TAVR evaluation is under way.    ED Course: CHF exacerbation, ?h/o.  Has afib on Eliquis, AS.  Admitted last month for a CVA and was diagnosed then.  Increased SOB, crackles, hypoxia.  Placed on CPAP -> NRB.  +chest tightness, edema.  CXR with edema.  BNP 300s, troponin in 70s and stable.  No ongoing CP, EKG without ischemia.  Given Lasix and more comfortable on [place] O2.  BP 216/70, didn't take AM meds so given Coreg.  Review of Systems: As per HPI; otherwise review of systems reviewed and negative.   Ambulatory Status:  Ambulates with a cane  COVID Vaccine Status:  Complete plus 1 booster  Past Medical History:  Diagnosis Date   ALLERGIC RHINITIS 06/24/2007   Anemia     AVM (arteriovenous malformation) of colon    Blood transfusion without reported diagnosis 05/2015   COLONIC POLYPS, HX OF 06/24/2007   Coronary artery disease, non-occlusive 05/2015   Trop + w/ Acute Anemia =>CATH: small RI - Ostial 60%, ostial RCA 30% and dLAD 40-50%;; 9/'21: Cor Ca Score 624. Mild (25-49%) prox RCA & LAD,; Moderate (50-69%) Ostial Small RI & prox LCx.     DEPRESSION 10/08/2006   DIABETES MELLITUS, TYPE II 10/05/2006   GERD 06/24/2007   HYPERLIPIDEMIA 10/08/2006   HYPERTENSION 10/08/2006   INSOMNIA 09/24/2008   Left knee DJD    Moderate to severe aortic stenosis 05/2015   a) Mod AS (mean gras 22 mmHg, peak 42 mmHg -AVA 1.1-1.2 cm.);b) 04/2017: Severe Ca2+ AoV w/ Mod-Severe AS (p-m grad 60-34 mmHg -Moderate), calculated AVA 0.8 cm -Severe;;c) 03/2019: Mod AS (m grad 31 mmHg); d) 09/2019: Mod AS (m grad 34.5 mmHg).   Myocardial infarction ([place]) 05/2015   Type 2 MI - related to GI Bleed / Anemia (non-obstructive CAD by Cath)   Osteoporosis 03/10/2016   PEPTIC ULCER DISEASE 10/08/2006    Past Surgical History:  Procedure Laterality Date   APPENDECTOMY     BREAST BIOPSY     CARDIAC CATHETERIZATION N/A 05/21/2015   Procedure: Left Heart Cath and Coronary Angiography;  Surgeon: Leonie Man, MD;  Location: [facility];  Service: Cardiovascular;: Bartholomew  60%, Ost RCA 30%, dLAD tapers to small vessel w/ 40-50%. Mildly elevated LVEDP. Normal LV Fxn.   COLONOSCOPY N/A 05/20/2015   Procedure: COLONOSCOPY;  Surgeon: Manus Gunning, MD;  Location: [facility];  Service: Gastroenterology;  Laterality: N/A;   CORONARY CA2+ SCORE / CARDIAC CT ANGIOGRAM  10/09/2019   Calcium score 624.  82nd percentile. Dominant RCA: Mild (25-49%) proximal stenosis-distal bifurcation into PDA and PAV--< RPL branches.  LAD (1 major mid vessel diagonal) diffuse calcified plaque, mild proximal stenosis with minimal distal stenosis.  Small RI moderate ostial disease.  LCx-moderate mixed (50-69%) proximal  stenosis.  Small dOM1 disease.  Trileaflet AoV, annular Ca2+ - probable AS   ESOPHAGOGASTRODUODENOSCOPY N/A 05/20/2015   Procedure: ESOPHAGOGASTRODUODENOSCOPY (EGD);  Surgeon: Manus Gunning, MD;  Location: [facility];  Service: Gastroenterology;  Laterality: N/A;   TONSILLECTOMY     TRANSTHORACIC ECHOCARDIOGRAM  03/2019; 09/2019   a) EF 60 to 65%.  Moderate LVH.  GRII DD.  Mod-Severe AS (m grad 36 mmHg, peak 59 mmHg); b) EF 65-70%, No RWMA. Gr1 DD/hi LAP, Mild hi PAP. Mod LA Dil. MOD AS (mean Grad 34.5 mmHg).  STABLE    TRANSTHORACIC ECHOCARDIOGRAM  04/12/2017   EF 60-65%. No RWMA.  GR 1 DD.  Moderate concentric LVH.  Mild LA dilation. Severe calcified aortic valve with moderate-severe aortic stenosis (peak/mean gradients 60/34 mmHg) and in severe range by AVA (0.8 cm2).  Mild to moderately increased PA pressures 41 mmHg with normal RV size and function..     TUBAL LIGATION      Social History   Socioeconomic History   Marital status: Widowed    Spouse name: Not on file   Number of children: 1   Years of education: Not on file   Highest education level: Not on file  Occupational History   Occupation: retired - Social research officer, government: RETIRED  Tobacco Use   Smoking status: Never   Smokeless tobacco: Never  Substance and Sexual Activity   Alcohol use: No    Alcohol/week: 0.0 standard drinks   Drug use: No   Sexual activity: Not on file  Other Topics Concern   Not on file  Social History Narrative   Recently widowed--husband died in 11-25-2018.      Now lives alone but her son usually stays with her at night.  During the day she has 2 nephews who live nearby to come in and check on her intermittently.  Also one of her nieces calls routinely.      When her son is home and awake, she may try to walk on a treadmill, but is scared to walk outside.   Social Determinants of Health   Financial Resource Strain: Not on file  Food Insecurity: Not on file   Transportation Needs: Not on file  Physical Activity: Not on file  Stress: Not on file  Social Connections: Not on file  Intimate Partner Violence: Not on file    Allergies  Allergen Reactions   Ibuprofen Other (See Comments)    Bleeding events    Family History  Problem Relation Age of Onset   Alcohol abuse Other    Arthritis Other        DJD   Hyperlipidemia Other    Heart disease Other    Stroke Other    Hypertension Other    Depression Other    Diabetes Other    Cancer Mother  ENT cancer   CAD Sister        Several MI & PPM; long term smoker, EtOH   Heart attack Sister 35       several   Arrhythmia Sister        s/p PPM (Dr. Sallyanne Kuster)    Prior to Admission medications   Medication Sig Start Date End Date Taking? Authorizing Provider  acetaminophen (TYLENOL) 500 MG tablet Take 500 mg by mouth every 6 (six) hours as needed for moderate pain.   Yes [provider]  apixaban (ELIQUIS) 5 MG TABS tablet Take 1 tablet (5 mg total) by mouth 2 (two) times daily. 08/19/20  Yes Biagio Borg, MD  atorvastatin (LIPITOR) 40 MG tablet TAKE 1 TABLET BY MOUTH EVERY DAY Patient taking differently: Take 40 mg by mouth daily. 08/20/20  Yes Leonie Man, MD  benzonatate (TESSALON) 200 MG capsule Take 1 capsule (200 mg total) by mouth 3 (three) times daily as needed for cough. 09/21/20  Yes Burns, Claudina Lick, MD  carvedilol (COREG) 6.25 MG tablet TAKE 1 TABLET BY MOUTH TWICE A DAY Patient taking differently: Take 6.25 mg by mouth 2 (two) times daily with a meal. 02/26/20  Yes Leonie Man, MD  cyanocobalamin ([employer] VITAMIN B12) 1000 MCG tablet Take 1 tablet (1,000 mcg total) by mouth daily. 07/14/20  Yes Biagio Borg, MD  doxycycline (VIBRA-TABS) 100 MG tablet Take 1 tablet (100 mg total) by mouth 2 (two) times daily. 09/21/20  Yes Burns, Claudina Lick, MD  glimepiride (AMARYL) 2 MG tablet TAKE 1 TABLET BY MOUTH EVERY DAY BEFORE BREAKFAST Patient taking differently: Take 2 mg by  mouth daily with breakfast. 09/16/20  Yes Biagio Borg, MD  metFORMIN (GLUCOPHAGE) 1000 MG tablet TAKE 1 TAB BY MOUTH IN THE AM,AND 1/2 TAB IN THE PM Patient taking differently: Take 500-1,000 mg by mouth 2 (two) times daily with a meal. Take $RemoveBe'1000mg'zFyPystto$  in the AM and 500 in the PM 07/14/20  Yes Biagio Borg, MD  pantoprazole (PROTONIX) 40 MG tablet Take 1 tablet (40 mg total) by mouth daily. 07/14/20  Yes Biagio Borg, MD  blood glucose meter kit and supplies Dispense based on patient and insurance preference. Use up to four times daily as directed. E11.9 04/15/18   Biagio Borg, MD  Blood Glucose Monitoring Suppl (ONE TOUCH ULTRA 2) w/Device KIT Use as directed daily 06/01/15   Biagio Borg, MD  clonazePAM (KLONOPIN) 0.5 MG tablet TAKE 1 TABLET BY MOUTH TWICE A DAY AS NEEDED FOR ANXIETY Patient not taking: Reported on 09/27/2020 07/14/20   Biagio Borg, MD  irbesartan (AVAPRO) 300 MG tablet Take 1 tablet (300 mg total) by mouth daily. Patient not taking: Reported on 09/27/2020 08/31/20   Leonie Man, MD  Lancets [facility] DELICA PLUS [id number]) [place] USE AS DIRECTED TEST 1-2 TIMES A DAY 09/05/20   Biagio Borg, MD  [facility] ULTRA test strip USE AS INSTRUCTED TEST 1-2 TIMES A DAY 07/28/19   Biagio Borg, MD  PFIZER-BIONT COVID-19 VAC-TRIS SUSP injection  05/12/20   [provider]    Physical Exam: Vitals:   09/27/20 0830 09/27/20 0915 09/27/20 1211 09/27/20 1215  BP: (!) 209/75 (!) 216/70 (!) 194/96 (!) 111/93  Pulse: 62 61 62 65  Resp: (!) 22 (!) $Remo'21 18 19  'RvxAF$ Temp:   97.9 F (36.6 C)   TempSrc:   Temporal   SpO2: 100% 100% 98% 99%  Weight:  Height:         General:  Appears calm and comfortable and is in NAD, very conversant, on 2-3 L [place] O2 Eyes:   EOMI, normal lids, iris ENT:  grossly normal hearing, lips & tongue, mmm; artificial dentition Neck:  no LAD, masses or thyromegaly Cardiovascular:  Irregularly irregular, 9-5/2 systolic murmur best heard in aortic region. 1+ LE edema.   Respiratory:   Bibasilar crackles.  Normal respiratory effort on [place] O2. Abdomen:  soft, NT, +distended Back:   normal alignment Skin:  no rash or induration seen on limited exam Musculoskeletal:  grossly normal tone BUE/BLE, good ROM, no bony abnormality Psychiatric:  grossly normal mood and affect, speech fluent and appropriate, AOx3 Neurologic:  CN 2-12 grossly intact, moves all extremities in coordinated fashion    Radiological Exams on Admission: Independently reviewed - see discussion in A/P where applicable  DG Chest Port 1 View  Result Date: 09/27/2020 CLINICAL DATA:  Shortness of breath for 2 days, history coronary artery disease post MI, hypertension, type II diabetes mellitus EXAM: PORTABLE CHEST 1 VIEW COMPARISON:  Portable exam 0741 hours compared to 08/10/2020 FINDINGS: Enlargement of cardiac silhouette with pulmonary vascular congestion. Mediastinal contours normal. Mild interstitial prominence in lower lungs, question due to hypoinflation or minimal edema. No pleural effusion, segmental consolidation, or pneumothorax. Bones demineralized. IMPRESSION: Enlargement of cardiac silhouette with pulmonary vascular congestion. Interstitial prominence in the lower lungs question due to hypoinflation or minimal pulmonary edema. Electronically Signed   By: Lavonia Dana M.D.   On: 09/27/2020 08:09    EKG: Independently reviewed.  NSR with rate 67; nonspecific ST changes with no evidence of acute ischemia   Labs on Admission: I have personally reviewed the available labs and imaging studies at the time of the admission.  Pertinent labs:   Bili 1.7 - stable BNP 398.1 HS troponin 77, 74 WBC 6.7 Hgb 11.5 - stable COVID/flu negative   Assessment/Plan Principal Problem:   Acute on chronic diastolic CHF (congestive heart failure) (HCC) Active Problems:   Type 2 diabetes mellitus with hyperglycemia, without long-term current use of insulin (HCC)   Hyperlipidemia with target LDL less  than 70   Morbid obesity (HCC)   Severe aortic stenosis   Paroxysmal atrial fibrillation (HCC)   Acute on chronic diastolic CHF, complicated by severe AS -Patient with known h/o chronic diastolic CHF (grade 1 on echo in 09/2019) and recent progression to severe AS (08/10/20 echo) presenting with worsening SOB and hypoxia (per EDP, patient desats into 80s without [name] O2, but is no longer needing CPAP or NRB O2) -CXR consistent with mild pulmonary edema -Elevated BNP compared to 2017 -With elevated BNP and abnl CXR, acute decompensated CHF seems probable as diagnosis -She was being started for TAVR evaluation and this may need to hasten that -Will admit, as per the Emergency HF Mortality Risk Grade.  The patient has: cardiac arrhythmia; severe pulmonary edema requiring new O2 therapy; likely need for valvular intervention. -CHF order set utilized -Was given Lasix 80 mg x 1 in ER and will repeat with 40 mg IV BID -Continue  O2 for now -Stable kidney function at this time, will follow -Cardiology consulted -Will stop Doxy at this time, as there is no current indication of bacterial infection  HTN -Takes low-dose Coreg monotherapy at home -Patient with suboptimal control while in the ER -Will add prn hydralazine  HLD -Continue Lipitor -Lipids were checked in 7/22 (TC 97, HDL 32, LDL 48, TG 86) so  will not repeat at this time  DM -Last A1c was 7.4 on 08/10/20 -Hold Glucophage and Amaryl -Will cover with moderate-scale SSI for now  Afib -Rate controlled with Coreg -Continue Eliquis  Obesity -Body mass index is 42.91 kg/m..  -Weight loss should be encouraged -Outpatient PCP/bariatric medicine/bariatric surgery f/u encouraged     Note: This patient has been tested and is negative for the novel coronavirus COVID-19. She has been fully vaccinated against COVID-19.   DVT prophylaxis: Eliquis Code Status:  Full - confirmed with patient, has never thought about it and wants to think  more and discuss with family Family Communication: None present Disposition Plan:  The patient is from: home  Anticipated d/c is to: home, possibly with [facility] services  Anticipated d/c date will depend on clinical response to treatment, likely 2-4 days  Patient is currently: acutely ill Consults called: Cardiology; [facility] team; PT/OT Admission status: Admit - It is my clinical opinion that admission to INPATIENT is reasonable and necessary because this patient will require at least 2 midnights in the hospital to treat this condition based on the medical complexity of the problems presented.  Given the aforementioned information, the predictability of an adverse outcome is felt to be significant.     Karmen Bongo MD [facility]   How to contact the [facility] Attending or Consulting provider [place] or covering provider during after hours [place], for this patient?  Check the care team in [facility] and look for a) attending/consulting [facility] provider listed and b) the [facility] team listed Log into [email or web address] and use [place]'s universal password to access. If you do not have the password, please contact the hospital operator. Locate the [facility] provider you are looking for under [facility] and page to a number that you can be directly reached. If you still have difficulty reaching the provider, please page the [facility] (Director on Call) for the Hospitalists listed on amion for assistance.   09/27/2020, 1:17 PM

## 2020-09-27 NOTE — ED Triage Notes (Addendum)
Patient arrived with EMS from home woke up at 3am this morning with SOB/chest tightness , received ASA 324 mg and 2 NTG sl by EMS prior to arrival , hypertensive BP= 198/68 . RT switched CPAP to NRB mask at 15 lpm , O2 sat= 100%.

## 2020-09-27 NOTE — ED Notes (Signed)
NRB removed per Dr. Fredderick Phenix

## 2020-09-27 NOTE — Consult Note (Addendum)
Cardiology Consultation:   Patient ID: Mary Mccarthy MRN: [id number]; DOB: 04-Aug-1940  Admit date: 09/27/2020 Date of Consult: 09/27/2020  Primary Care Provider: Biagio Borg, MD [facility] Cardiologist: Glenetta Hew, MD  [facility] Electrophysiologist:  None    Patient Profile:   Mary Mccarthy is a 80 y.o. female with a hx of moderate-severe aortic stenosis, HFpEF, non obstructive CAD, atrial fibrillation on eliquis, CVA, HTN, pulmonary HTN who is being seen today for the evaluation of acute heart failure exacerbation at the request of [facility] hospitalist services.  History of Present Illness:   Mary Mccarthy states she has not felt well for the past 2 or 3 months.  She notes that 2 to 3 weeks ago she had a sore throat, nasal and chest congestion, productive cough with phlegm and was short of breath.  She was seen by her primary care provider who prescribed antibiotics.  She was prescribed doxycyline, tessalon, and OTC mucinex. She felt better until 2 days ago when she woke up with worsening lower extremity swelling and worsening of her shortness of breath. She slept on two pillows yesterday evening, due to her shortness of death. She is short of breath at rest and unable to perform her usual tasks of going to the grocery store. She has been adherent to all of her medications. Denies change in diet over past 24 hours.   She denies chills, lightheadedness, dizziness,chest pain, back pain, vomiting. She has had some nausea and has had chronic diarrhea for the past few years. Since her CVA she has some residual left foot weakness. She is uncertain of her dry weight but at home she weighed 207.5 lbs  She states she was supposed to follow up with the heart doctors to discuss further management of her abnormal heart rhythm and valvular disease. She does not take her blood pressures at home, but does endorse "white coat hypertension."    Past Medical History:  Diagnosis Date   ALLERGIC  RHINITIS 06/24/2007   Anemia    AVM (arteriovenous malformation) of colon    Blood transfusion without reported diagnosis 05/2015   COLONIC POLYPS, HX OF 06/24/2007   Coronary artery disease, non-occlusive 05/2015   Trop + w/ Acute Anemia =>CATH: small RI - Ostial 60%, ostial RCA 30% and dLAD 40-50%;; 9/'21: Cor Ca Score 624. Mild (25-49%) prox RCA & LAD,; Moderate (50-69%) Ostial Small RI & prox LCx.     DEPRESSION 10/08/2006   DIABETES MELLITUS, TYPE II 10/05/2006   GERD 06/24/2007   HYPERLIPIDEMIA 10/08/2006   HYPERTENSION 10/08/2006   INSOMNIA 09/24/2008   Left knee DJD    Moderate to severe aortic stenosis 05/2015   a) Mod AS (mean gras 22 mmHg, peak 42 mmHg -AVA 1.1-1.2 cm.);b) 04/2017: Severe Ca2+ AoV w/ Mod-Severe AS (p-m grad 60-34 mmHg -Moderate), calculated AVA 0.8 cm -Severe;;c) 03/2019: Mod AS (m grad 31 mmHg); d) 09/2019: Mod AS (m grad 34.5 mmHg).   Myocardial infarction ([place]) 05/2015   Type 2 MI - related to GI Bleed / Anemia (non-obstructive CAD by Cath)   Osteoporosis 03/10/2016   PEPTIC ULCER DISEASE 10/08/2006    Past Surgical History:  Procedure Laterality Date   APPENDECTOMY     BREAST BIOPSY     CARDIAC CATHETERIZATION N/A 05/21/2015   Procedure: Left Heart Cath and Coronary Angiography;  Surgeon: Leonie Man, MD;  Location: [place] [facility];  Service: Cardiovascular;: Ost RI 60%, Ost RCA 30%, dLAD tapers to small vessel w/ 40-50%. Mildly  elevated LVEDP. Normal LV Fxn.   COLONOSCOPY N/A 05/20/2015   Procedure: COLONOSCOPY;  Surgeon: Manus Gunning, MD;  Location: [place];  Service: Gastroenterology;  Laterality: N/A;   CORONARY CA2+ SCORE / CARDIAC CT ANGIOGRAM  10/09/2019   Calcium score 624.  82nd percentile. Dominant RCA: Mild (25-49%) proximal stenosis-distal bifurcation into PDA and PAV--< RPL branches.  LAD (1 major mid vessel diagonal) diffuse calcified plaque, mild proximal stenosis with minimal distal stenosis.  Small RI moderate ostial disease.   LCx-moderate mixed (50-69%) proximal stenosis.  Small dOM1 disease.  Trileaflet AoV, annular Ca2+ - probable AS   ESOPHAGOGASTRODUODENOSCOPY N/A 05/20/2015   Procedure: ESOPHAGOGASTRODUODENOSCOPY (EGD);  Surgeon: Manus Gunning, MD;  Location: [place];  Service: Gastroenterology;  Laterality: N/A;   TONSILLECTOMY     TRANSTHORACIC ECHOCARDIOGRAM  03/2019; 09/2019   a) EF 60 to 65%.  Moderate LVH.  GRII DD.  Mod-Severe AS (m grad 36 mmHg, peak 59 mmHg); b) EF 65-70%, No RWMA. Gr1 DD/hi LAP, Mild hi PAP. Mod LA Dil. MOD AS (mean Grad 34.5 mmHg).  STABLE    TRANSTHORACIC ECHOCARDIOGRAM  04/12/2017   EF 60-65%. No RWMA.  GR 1 DD.  Moderate concentric LVH.  Mild LA dilation. Severe calcified aortic valve with moderate-severe aortic stenosis (peak/mean gradients 60/34 mmHg) and in severe range by AVA (0.8 cm2).  Mild to moderately increased PA pressures 41 mmHg with normal RV size and function..     TUBAL LIGATION       Home Medications:  Prior to Admission medications   Medication Sig Start Date End Date Taking? Authorizing Provider  acetaminophen (TYLENOL) 500 MG tablet Take 500 mg by mouth every 6 (six) hours as needed for moderate pain.   Yes [provider]  apixaban (ELIQUIS) 5 MG TABS tablet Take 1 tablet (5 mg total) by mouth 2 (two) times daily. 08/19/20  Yes Biagio Borg, MD  atorvastatin (LIPITOR) 40 MG tablet TAKE 1 TABLET BY MOUTH EVERY DAY Patient taking differently: Take 40 mg by mouth daily. 08/20/20  Yes Leonie Man, MD  benzonatate (TESSALON) 200 MG capsule Take 1 capsule (200 mg total) by mouth 3 (three) times daily as needed for cough. 09/21/20  Yes Burns, Claudina Lick, MD  carvedilol (COREG) 6.25 MG tablet TAKE 1 TABLET BY MOUTH TWICE A DAY Patient taking differently: Take 6.25 mg by mouth 2 (two) times daily with a meal. 02/26/20  Yes Leonie Man, MD  cyanocobalamin ([employer] VITAMIN B12) 1000 MCG tablet Take 1 tablet (1,000 mcg total) by mouth daily. 07/14/20  Yes  Biagio Borg, MD  doxycycline (VIBRA-TABS) 100 MG tablet Take 1 tablet (100 mg total) by mouth 2 (two) times daily. 09/21/20  Yes Burns, Claudina Lick, MD  glimepiride (AMARYL) 2 MG tablet TAKE 1 TABLET BY MOUTH EVERY DAY BEFORE BREAKFAST Patient taking differently: Take 2 mg by mouth daily with breakfast. 09/16/20  Yes Biagio Borg, MD  metFORMIN (GLUCOPHAGE) 1000 MG tablet TAKE 1 TAB BY MOUTH IN THE AM,AND 1/2 TAB IN THE PM Patient taking differently: Take 500-1,000 mg by mouth 2 (two) times daily with a meal. Take 1024m in the AM and 500 in the PM 07/14/20  Yes JBiagio Borg MD  pantoprazole (PROTONIX) 40 MG tablet Take 1 tablet (40 mg total) by mouth daily. 07/14/20  Yes JBiagio Borg MD  blood glucose meter kit and supplies Dispense based on patient and insurance preference. Use up to four times daily as  directed. E11.9 04/15/18   Biagio Borg, MD  Blood Glucose Monitoring Suppl (ONE TOUCH ULTRA 2) w/Device KIT Use as directed daily 06/01/15   Biagio Borg, MD  Lancets [facility] DELICA PLUS [id number]) MISC USE AS DIRECTED TEST 1-2 TIMES A DAY 09/05/20   Biagio Borg, MD  [facility] ULTRA test strip USE AS INSTRUCTED TEST 1-2 TIMES A DAY 07/28/19   Biagio Borg, MD  PFIZER-BIONT COVID-19 VAC-TRIS SUSP injection  05/12/20   [provider]    Inpatient Medications: Scheduled Meds:  apixaban  5 mg Oral BID   atorvastatin  40 mg Oral Daily   carvedilol  6.25 mg Oral BID WC   docusate sodium  100 mg Oral BID   furosemide  40 mg Intravenous BID   insulin aspart  0-15 Units Subcutaneous TID WC   pantoprazole  40 mg Oral Daily   sodium chloride flush  3 mL Intravenous Q12H   Continuous Infusions: None  PRN Meds: acetaminophen **OR** acetaminophen, benzonatate, bisacodyl, hydrALAZINE, morphine injection, ondansetron **OR** ondansetron (ZOFRAN) IV, oxyCODONE, polyethylene glycol, traZODone  Allergies:    Allergies  Allergen Reactions   Ibuprofen Other (See Comments)    Bleeding events     Social History:   Social History   Socioeconomic History   Marital status: Widowed    Spouse name: Not on file   Number of children: 1   Years of education: Not on file   Highest education level: Not on file  Occupational History   Occupation: retired - Social research officer, government: RETIRED  Tobacco Use   Smoking status: Never   Smokeless tobacco: Never  Substance and Sexual Activity   Alcohol use: No    Alcohol/week: 0.0 standard drinks   Drug use: No   Sexual activity: Not on file  Other Topics Concern   Not on file  Social History Narrative   Recently widowed--husband died in 2018/12/03.      Now lives alone but her son usually stays with her at night.  During the day she has 2 nephews who live nearby to come in and check on her intermittently.  Also one of her nieces calls routinely.      When her son is home and awake, she may try to walk on a treadmill, but is scared to walk outside.   Social Determinants of Health   Financial Resource Strain: Not on file  Food Insecurity: Not on file  Transportation Needs: Not on file  Physical Activity: Not on file  Stress: Not on file  Social Connections: Not on file  Intimate Partner Violence: Not on file    Family History:   Family History  Problem Relation Age of Onset   Alcohol abuse Other    Arthritis Other        DJD   Hyperlipidemia Other    Heart disease Other    Stroke Other    Hypertension Other    Depression Other    Diabetes Other    Cancer Mother        ENT cancer   CAD Sister        Several MI & PPM; long term smoker, EtOH   Heart attack Sister 41       several   Arrhythmia Sister        s/p PPM (Dr. Sallyanne Kuster)    ROS:  Please see the history of present illness.  All other ROS reviewed and negative.  Physical Exam/Data:   Vitals:   09/27/20 0915 09/27/20 1211 09/27/20 1215 09/27/20 1319  BP: (!) 216/70 (!) 194/96 (!) 111/93 (!) 194/66  Pulse: 61 62 65 (!) 58  Resp: (!) _0 Temp:  97.9 F (36.6 C)  97.8 F (36.6 C)  TempSrc:  Temporal  Oral  SpO2: 100% 98% 99% 98%  Weight:      Height:       No intake or output data in the 24 hours ending 09/27/20 1516 Last 3 Weights 09/27/2020 08/23/2020 08/19/2020  Weight (lbs) 227 lb 1.2 oz 209 lb 9.6 oz 208 lb 6.4 oz  Weight (kg) 103 kg 95.074 kg 94.53 kg     Body mass index is 42.91 kg/m.  General: no acute distress HEENT: mucous membranes dry Lymph: no adenopathy Neck: JVD mid neck Endocrine:  No thryomegaly Cardiac:  irregular iregular.. 4/6 crescendo-decrescendo systolic murmur radiating to the neck and best heard at the 2nd right intercostal space Lungs:  Saturating well on 2L [place]. Bilateral crackles at lower lung bases Abd: soft, nontender, no hepatomegaly  Ext: 1-2+ lower extremity edema. Extremities warm to touch Skin: warm and dry  Neuro:  CNs 2-12 intact, no focal abnormalities noted Psych:  Normal affect   EKG:  The EKG was personally reviewed and demonstrates:  70 BPM sinus rhythm with PAC's. Normal axis. Normal Intervals. Compared to prior from 08/23/20, PAC's are new.  Telemetry:  Telemetry was personally reviewed and demonstrates: normal sinus rhythm with runs of SVT.   Relevant CV Studies:  TTE 07/22 1. The aortic valve is calcified. Aortic valve regurgitation is not  visualized. Severe aortic valve stenosis. Aortic valve area, by VTI  measures 0.86 cm. Aortic valve mean gradient measures 42.0 mmHg. Aortic  valve Vmax measures 4.36 m/s.   2. Left ventricular ejection fraction, by estimation, is 55 to 60%. The  left ventricle has normal function. The left ventricle has no regional  wall motion abnormalities. There is severe concentric left ventricular  hypertrophy. Left ventricular diastolic   parameters are indeterminate.   3. Right ventricular systolic function is normal. The right ventricular  size is normal. There is moderately elevated pulmonary artery systolic  pressure.   4.  Left atrial size was mildly dilated.   5. The pericardial effusion is circumferential.   6. The mitral valve is abnormal. Trivial mitral valve regurgitation. The  mean mitral valve gradient is 2.0 mmHg with average heart rate of 53 bpm.  Severe mitral annular calcification.   TTE 10/01/2019: EF 65 to 70%.  Normal RWMA, G1 DD-elevated LAP.  Mildly elevated PAP.  Moderate LA dilation.  Moderate aortic valve stenosis.  (Mean gradient 34.5). ->  Probably still stable.  2019 mean gradient was 36, then reduced to 31 this February 21 -> now 34.5. Plan was to probably recheck echo in 6 to 12 months.  CORONARY CALCIUM SCORE-CTA 10/09/2019: Calcium score 624.  82nd percentile. Dominant RCA: Mild (25-49%) proximal stenosis-distal bifurcation into PDA and PAV--< RPL branches.  LAD (1 major mid vessel diagonal) diffuse calcified plaque, mild proximal stenosis with minimal distal stenosis.  Small RI moderate ostial disease.  LCx-moderate mixed (50-69%) proximal stenosis.  Small distal OM1 disease.  Trileaflet aortic valve annular calcification of probable stenosis.  LHC 2017 Ost Ramus lesion, 60% stenosed. Ost RCA lesion, 30% stenosed. Dist LAD lesion, tapers to a small vessel with diffuse roughly 40-50% stenosis. The left ventricular systolic function is normal. Elevated LVEDP  Laboratory  Data:  High Sensitivity Troponin:   Recent Labs  Lab 09/27/20 0705 09/27/20 0935  TROPONINIHS 77* 74*     Chemistry Recent Labs  Lab 09/27/20 0705 09/27/20 0739  NA 140 143  K 3.4* 3.5  CL 105 105  CO2 25  --   GLUCOSE 109* 106*  BUN 15 18  CREATININE 0.80 0.70  CALCIUM 9.2  --   GFRNONAA >60  --   ANIONGAP 10  --     Recent Labs  Lab 09/27/20 0705  PROT 6.8  ALBUMIN 3.3*  AST 21  ALT 16  ALKPHOS 46  BILITOT 1.7*   Hematology Recent Labs  Lab 09/27/20 0705 09/27/20 0739  WBC 6.7  --   RBC 3.57*  --   HGB 11.5* 11.2*  HCT 34.6* 33.0*  MCV 96.9  --   MCH 32.2  --   MCHC 33.2  --   RDW  13.4  --   PLT 270  --    BNP Recent Labs  Lab 09/27/20 0705  BNP 398.1*    DDimer No results for input(s): DDIMER in the last 168 hours.   Radiology/Studies:  DG Chest Port 1 View  Result Date: 09/27/2020 CLINICAL DATA:  Shortness of breath for 2 days, history coronary artery disease post MI, hypertension, type II diabetes mellitus EXAM: PORTABLE CHEST 1 VIEW COMPARISON:  Portable exam 0741 hours compared to 08/10/2020 FINDINGS: Enlargement of cardiac silhouette with pulmonary vascular congestion. Mediastinal contours normal. Mild interstitial prominence in lower lungs, question due to hypoinflation or minimal edema. No pleural effusion, segmental consolidation, or pneumothorax. Bones demineralized. IMPRESSION: Enlargement of cardiac silhouette with pulmonary vascular congestion. Interstitial prominence in the lower lungs question due to hypoinflation or minimal pulmonary edema. Electronically Signed   By: Lavonia Dana M.D.   On: 09/27/2020 08:09     Assessment and Plan:   Acute on Chronic Diastolic Heart Failure Exacerbation History of non-ischemic HFpEF, recent echo 07/22 EF 55-60%. On admission, patient volume overloaded with new oxygen requirement, pulmonary edema, lower extremity swelling. Initially required CPAP on admission, stable on [place] O2. Elevated BNP of 398.1. Suspect HF exacerbation as underlying cause of symptoms. Given 80 mg IV lasix in ED and 40 mg IV BID. Discharge weight during last admission was 94.9 kg, weight during this admission of 103 kg. PAF and hypertension likely contributing to her acute exacerbation Mildly elevated high sensitivity Troponin's, do not suspect ACS at this time.  -continue IV lasix 40 mg BID.  -continue carvedilol 6.25 BID, no signs of hypoperfusion on exam -daily electrolytes, supplement as needed -strict I/O and daily weights  Aortic Stenosis Hx of moderate-severe aortic stenosis. Last echocardiogram 7/22 with aortic valve features of peak  velocity 4.36 m/s, mean gradient 42 mm Hg, AV area 0.86 cm, dimensionless index 0.27. Denies angina, syncope, lightheadedness, but does have worsening of her shortness of breath and is hypervolemic on exam. Patient is not current candidate for elective TAVR procedure with her stroke being a month ago. She will need preTAVR workup which includes R&LHC. Once euvolemic and patient able to lye flat, can perform procedure during this admission -continue to diurese, perform hearth catheterization once euvolemic.   Atrial Fibrillation Found to be in atrial fibrillation during work up for her acute CVA. During this time she was started on eliquis and followed up in the afib clinic. On telemetry appears to be in normal sinus rhythm with runs of SVT. Possible indication of worsening of her atrial stenosis. She  is currently rate controlled.  -continue cardiac monitoring -continue eliquis 5 mg BID, carvedilol 6.25 mg BID  Hypertensive Urgency Hx of HTN, current regimen of irbesartan 300 mg daily and coreg 6.25 BID. Prior to CVA patient also on chlorthalidone, however, this was discontinued and she remained normotensive since. On admission patient hypertensive in the 190-200's, likely contributing to her shortness of breath.  -continue home BP meds of irbesartan and coreg -continue diuretic therapy as per above  Hx of non-occlusive CAD LHC in 2017, Ost ramus lesion 60% stenosed, Ost RCA lesion 30% stenosed, distal LAD tapers to small vessel with diffuse roughly 40-50% stenosis. Do not suspect acute ACS as etiology of heart failure exacerbation. She will need repeat hearth catheterization prior to TAVR procedure.  -continue atorvastatin 40 mg daily and DM managment  Hx of CVA Patient admitted 07/22 with shower of strokes thought to be embolic in etiology 2/2 atrial fibrillation. She was started on eliquis and has been adherent to therapy. Was having home PT at her house for her residual left foot issues.   -continue eliquis -continue statin therapy and DM management  HLD Continue atorvastatin 40 mg daily. Last lipid panel 07/22, total C of 97 and LDL of 48.   T2DM Last A1c of 7.4 07/22, medication regimen of metformin and glimepiride.  -SSI, management per primary team  New York Heart Association (NYHA) Functional Class NYHA Class III  CHA2DS2-VASc Score = 8  This indicates a 10.8% annual risk of stroke. The patient's score is based upon: CHF History: No HTN History: Yes Diabetes History: Yes Stroke History: Yes Vascular Disease History: Yes Age Score: 2 Gender Score: 1   For questions or updates, please contact [place] Please consult [email or web address] for contact info under   Signed, [facility]  Internal Medicine Resident PGY-2 [place]  Pager: [phone]  Patient seen, examined. Available data reviewed. Agree with findings, assessment, and plan as outlined by Dr Johnney Ou.  The patient is independently interviewed and examined.  She is an 80 year old woman with severe aortic stenosis who is admitted with acute on chronic diastolic heart failure.  On my exam, she is alert, oriented, in no distress.  HEENT is normal.  Jugular venous pressure is moderately elevated.  Carotid upstrokes are normal with bilateral bruits right greater than left.  Lungs have bibasilar crackles but are otherwise clear.  Heart is regular rate and rhythm with a 3/6 crescendo decrescendo murmur at the right upper sternal border, diminished A2.  Abdomen is soft and nontender with no masses.  Positive bowel sounds.  Extremities have trace pretibial edema.  Skin is warm and dry with no rash.  Neurologic is grossly intact.  An echocardiogram from August 10, 2020 shows progression of aortic stenosis with a mean transvalvular gradient of 42 mmHg, peak transaortic velocity of 4.4 m/s, and calculated aortic valve area of 0.86 cm.  This demonstrated progressive aortic stenosis, now clearly in the  severe range.  Aortic valve replacement is indicated in the setting of acute diastolic heart failure and severe aortic stenosis.  The natural history of aortic stenosis is reviewed with the patient today.  We discussed specific treatment options including palliative medical therapy, conventional surgical aortic valve replacement, and transcatheter aortic valve replacement.  Considering her advanced age, she would be most appropriate for TAVR as long as her anatomy is favorable.  She will require preoperative studies to include right and left heart catheterization and CT angiography studies of the heart as well as  the chest, abdomen, and pelvis.  She will require further diuresis for treatment of volume overload and diastolic heart failure.  At the present time she continues to have evidence of volume overload and inspiratory crackles.  We will continue with IV diuresis over the next 24 hours and would anticipate right and left heart catheterization later this week.  We discussed the need for cardiac surgical referral as part of a multidisciplinary approach to her care once her preoperative studies are completed.  We will plan on doing some of her pre-TAVR studies while she is here in the hospital as tolerated.  She will be sent for outpatient cardiac surgical evaluation and likely return for TAVR once her studies and evaluation are all completed.  We will follow with you while she is hospitalized.  Sherren Mocha, M.D. 09/27/2020 5:34 PM

## 2020-09-28 ENCOUNTER — Inpatient Hospital Stay (HOSPITAL_COMMUNITY): Payer: Medicare Other

## 2020-09-28 DIAGNOSIS — I5033 Acute on chronic diastolic (congestive) heart failure: Secondary | ICD-10-CM | POA: Diagnosis not present

## 2020-09-28 DIAGNOSIS — I350 Nonrheumatic aortic (valve) stenosis: Secondary | ICD-10-CM

## 2020-09-28 LAB — GLUCOSE, CAPILLARY
Glucose-Capillary: 117 mg/dL — ABNORMAL HIGH (ref 70–99)
Glucose-Capillary: 204 mg/dL — ABNORMAL HIGH (ref 70–99)
Glucose-Capillary: 152 mg/dL — ABNORMAL HIGH (ref 70–99)
Glucose-Capillary: 120 mg/dL — ABNORMAL HIGH (ref 70–99)
Glucose-Capillary: 197 mg/dL — ABNORMAL HIGH (ref 70–99)

## 2020-09-28 LAB — CBC
HCT: 32.7 % — ABNORMAL LOW (ref 36.0–46.0)
Hemoglobin: 11.1 g/dL — ABNORMAL LOW (ref 12.0–15.0)
MCH: 32.6 pg (ref 26.0–34.0)
MCHC: 33.9 g/dL (ref 30.0–36.0)
MCV: 95.9 fL (ref 80.0–100.0)
Platelets: 254 10*3/uL (ref 150–400)
RBC: 3.41 MIL/uL — ABNORMAL LOW (ref 3.87–5.11)
RDW: 13.5 % (ref 11.5–15.5)
WBC: 7.1 10*3/uL (ref 4.0–10.5)
nRBC: 0 % (ref 0.0–0.2)

## 2020-09-28 LAB — BASIC METABOLIC PANEL
Anion gap: 10 (ref 5–15)
BUN: 15 mg/dL (ref 8–23)
CO2: 30 mmol/L (ref 22–32)
Calcium: 8.9 mg/dL (ref 8.9–10.3)
Chloride: 98 mmol/L (ref 98–111)
Creatinine, Ser: 0.9 mg/dL (ref 0.44–1.00)
GFR, Estimated: >60 mL/min (ref >60)
Glucose, Bld: 127 mg/dL — ABNORMAL HIGH (ref 70–99)
Potassium: 3.3 mmol/L — ABNORMAL LOW (ref 3.5–5.1)
Sodium: 138 mmol/L (ref 135–145)

## 2020-09-28 LAB — MAGNESIUM: Magnesium: 0.8 mg/dL — CL (ref 1.7–2.4)

## 2020-09-28 IMAGING — CT CT ANGIO CHEST
2 of 7 series · 16 of 36 positions shown · IV contrast (APPLIED)
Comparison: Cardiac CT [DATE].

CLINICAL DATA: 79-year-old female with history of severe aortic
stenosis. Preprocedural study prior to potential transcatheter
aortic valve replacement (TAVR).

EXAM:
CT ANGIOGRAPHY CHEST, ABDOMEN AND PELVIS
TECHNIQUE: Multidetector CT imaging through the chest, abdomen and pelvis was
performed using the standard protocol during bolus administration of
intravenous contrast. Multiplanar reconstructed images and MIPs were
obtained and reviewed to evaluate the vascular anatomy.
CONTRAST:  95mL OMNIPAQUE IOHEXOL 350 MG/ML SOLN

[Series 5: ax thins · axial · 0.68mm/px · z∈[+718,+1293]mm · 15 of 647 slices shown]
[im 36/647  lung]
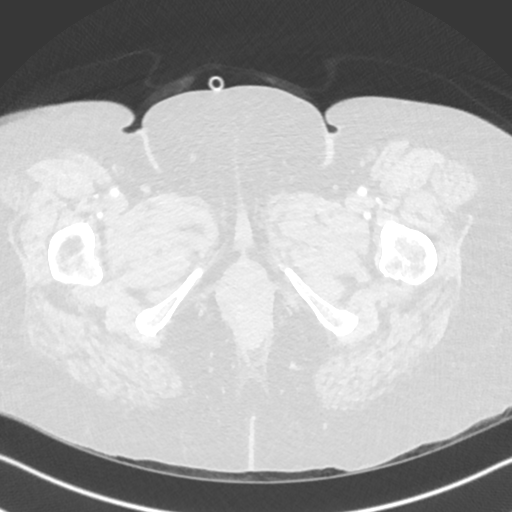
[im 72/647  mediastinal]
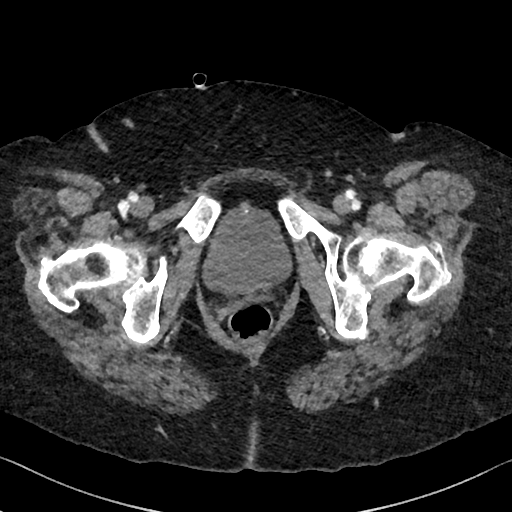
[im 108/647  lung]
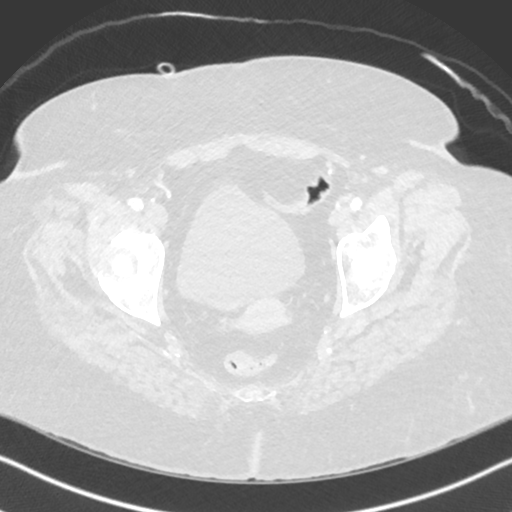
[im 144/647  mediastinal]
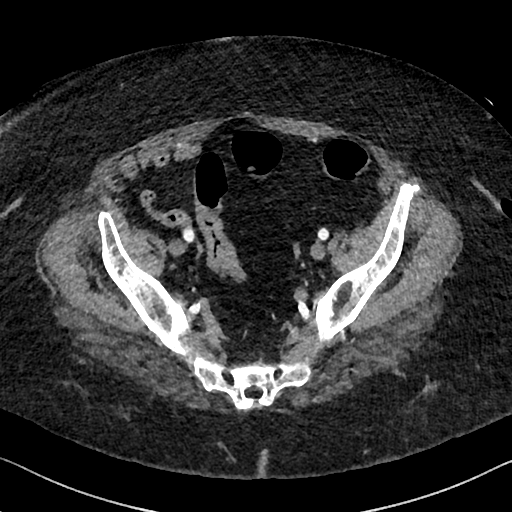
[im 216/647  lung]
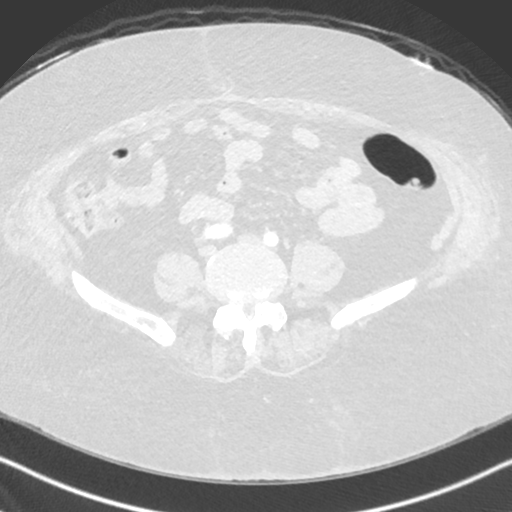
[im 252/647  mediastinal]
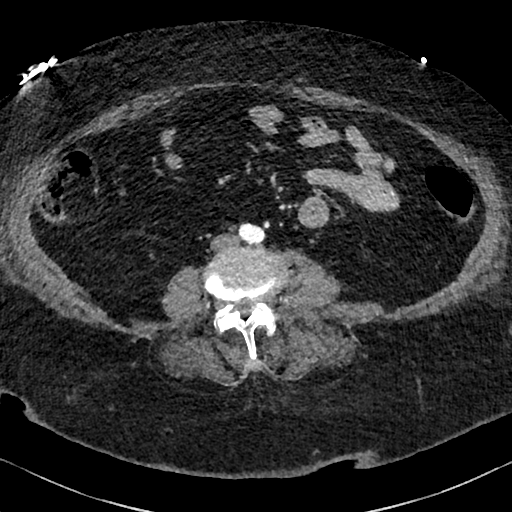
[im 288/647  lung]
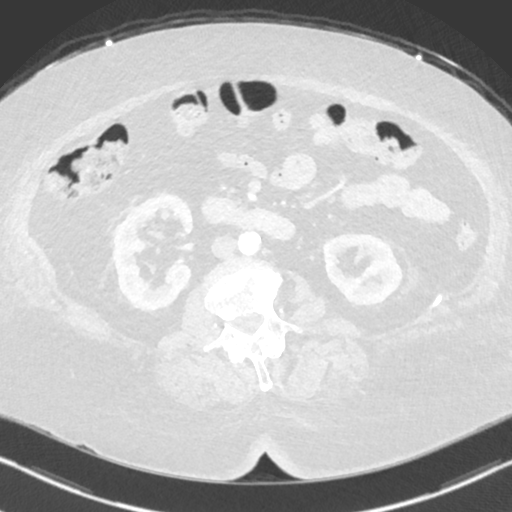
[im 324/647  mediastinal]
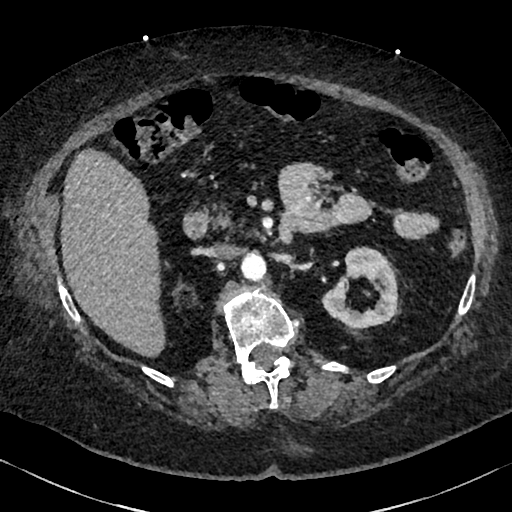
[im 359/647  lung]
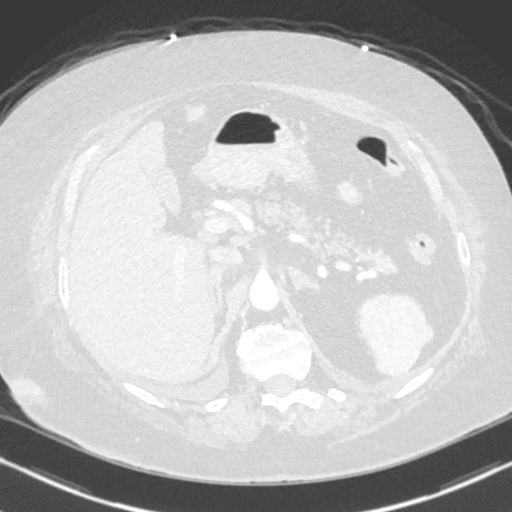
[im 395/647  mediastinal]
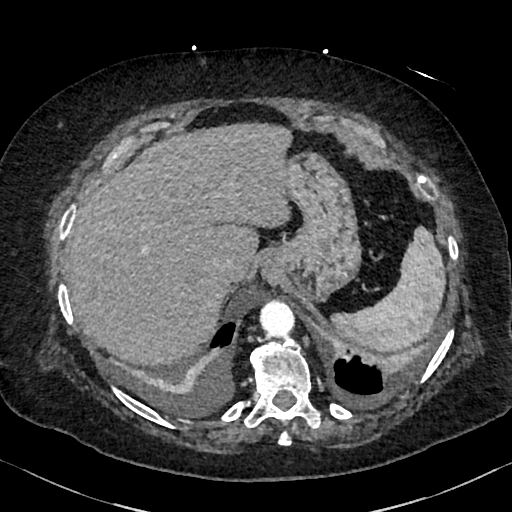
[im 431/647  lung]
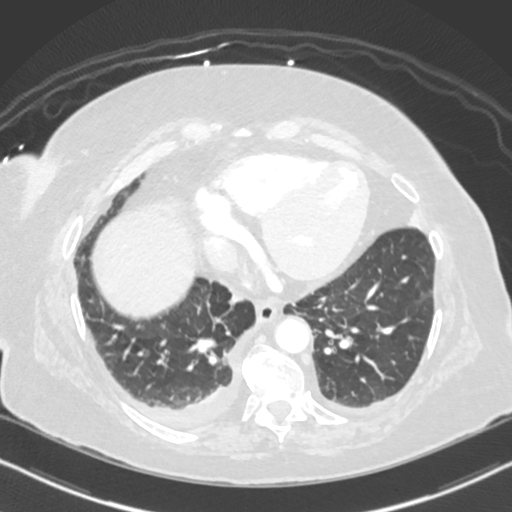
[im 503/647  mediastinal]
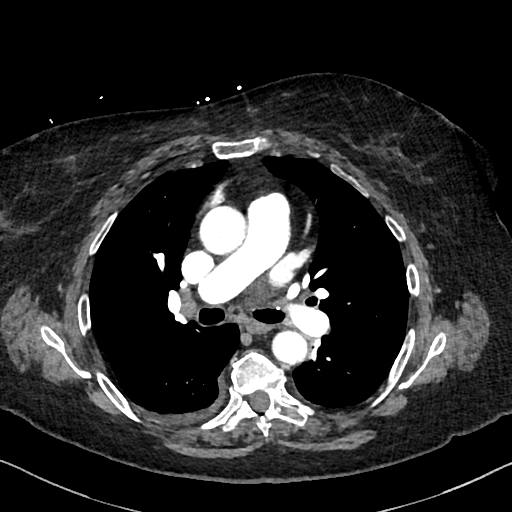
[im 539/647  lung]
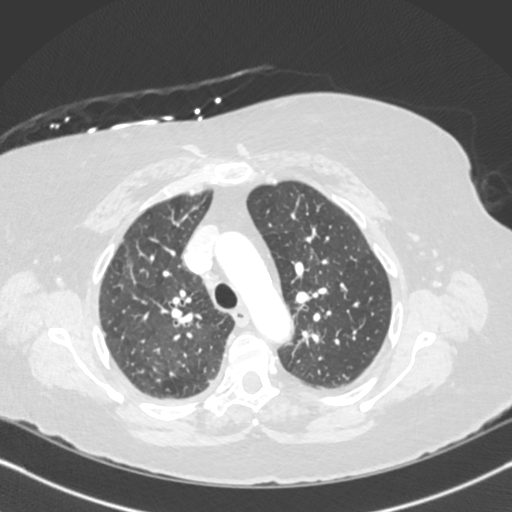
[im 575/647  mediastinal]
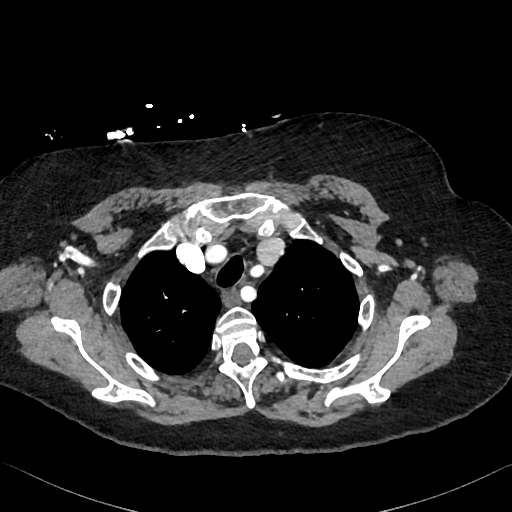
[im 611/647  lung]
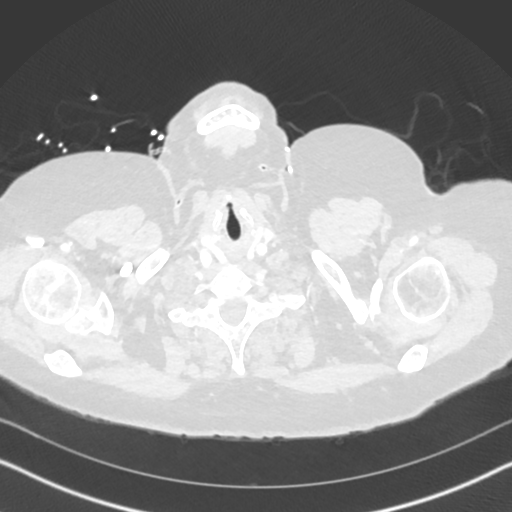

[Series 8: cor · coronal · 0.95mm/px · 1 of 172 slices shown]
[im 86/172  mediastinal]
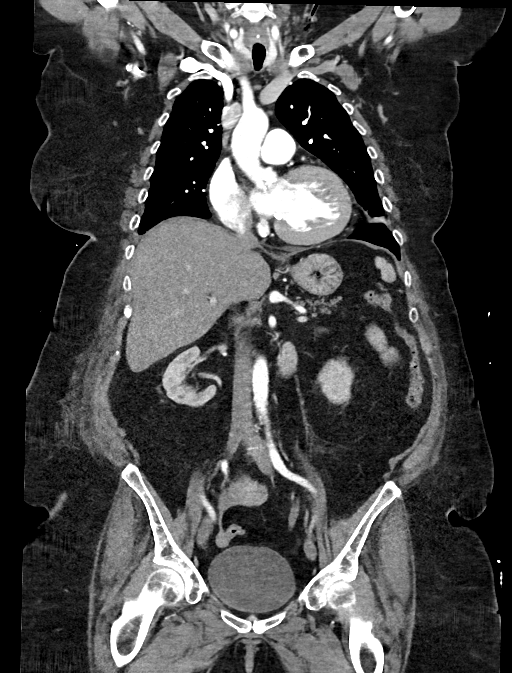

[16 of 36 positions shown; findings below may reference images not displayed]

FINDINGS: CTA CHEST FINDINGS

Cardiovascular: Heart size is normal. There is no significant
pericardial fluid, thickening or pericardial calcification. There is
aortic atherosclerosis, as well as atherosclerosis of the great
vessels of the mediastinum and the coronary arteries, including
calcified atherosclerotic plaque in the left main, left anterior
descending and right coronary arteries. Severe thickening
calcification of the aortic valve. Severe calcifications of the
mitral annulus.

Mediastinum/Lymph Nodes: No pathologically enlarged mediastinal or
hilar lymph nodes. Esophagus is unremarkable in appearance. No
axillary lymphadenopathy.

Lungs/Pleura: Trace bilateral pleural effusions lying dependently
(right greater than left). Associated areas of passive subsegmental
atelectasis in the lower lobes of the lungs bilaterally. No acute
consolidative airspace disease. 5 mm pulmonary nodule in the lateral
segment of the right middle lobe (axial image 61 of series 7).

Musculoskeletal/Soft Tissues: There are no aggressive appearing
lytic or blastic lesions noted in the visualized portions of the
skeleton.

CTA ABDOMEN AND PELVIS FINDINGS

Hepatobiliary: No suspicious cystic or solid hepatic lesions. No
intra or extrahepatic biliary ductal dilatation. Gallbladder is
normal in appearance.

Pancreas: No pancreatic mass. No pancreatic ductal dilatation. No
pancreatic or peripancreatic fluid collections or inflammatory
changes.

Spleen: Unremarkable.

Adrenals/Urinary Tract: Bilateral kidneys and adrenal glands are
normal in appearance. No hydroureteronephrosis. Urinary bladder is
normal in appearance.

Stomach/Bowel: The appearance of the stomach is normal. No
pathologic dilatation of small bowel or colon. The appendix is not
confidently identified and may be surgically absent. Regardless,
there are no inflammatory changes noted adjacent to the cecum to
suggest the presence of an acute appendicitis at this time.

Vascular/Lymphatic: Aortic atherosclerosis, without evidence of
aneurysm or dissection in the abdominal or pelvic vasculature.
Vascular findings and measurements pertinent to potential TAVR
procedure, as detailed below. No lymphadenopathy noted in the
abdomen or pelvis.

Reproductive: Uterus and ovaries are trophic.

Other: No significant volume of ascites.  No pneumoperitoneum.

Musculoskeletal: There are no aggressive appearing lytic or blastic
lesions noted in the visualized portions of the skeleton.

VASCULAR MEASUREMENTS PERTINENT TO TAVR:

AORTA:

Minimal Aortic [4C] x 15 mm

Severity of Aortic Calcification-mild

RIGHT PELVIS:

Right Common Iliac Artery -

Minimal [4C] x 9.1 mm

Tortuosity-mild

Calcification-minimal

Right External Iliac Artery -

Minimal [4C] x 6.5 mm

Tortuosity-moderate

Calcification-none

Right Common Femoral Artery -

Minimal [4C] x 6.9 mm

Tortuosity-mild

Calcification-minimal

LEFT PELVIS:

Left Common Iliac Artery -

Minimal [4C] x 8.8 mm

Tortuosity-mild

Calcification-none

Left External Iliac Artery -

Minimal [4C] x 7.0 mm

Tortuosity-moderate

Calcification-none

Left Common Femoral Artery -

Minimal [4C] x 6.8 mm

Tortuosity-mild

Calcification-minimal

Review of the MIP images confirms the above findings.
IMPRESSION: 1. Vascular findings and measurements pertinent to potential TAVR
procedure, as detailed above.
2. Severe thickening calcification of the aortic valve, compatible
with reported clinical history of severe aortic stenosis.
3. 5 mm pulmonary nodule in the lateral segment of the right middle
lobe. No follow-up needed if patient is low-risk. Non-contrast chest
CT can be considered in 12 months if patient is high-risk. This
recommendation follows the consensus statement: Guidelines for
Management of Incidental Pulmonary Nodules Detected on CT Images:
4. Trace bilateral pleural effusions (right greater than left) with
areas of passive subsegmental atelectasis in the lower lobes of the
lungs bilaterally.
5. Mild cardiomegaly.
6. Aortic atherosclerosis, in addition to left main and 3 vessel
coronary artery disease.
7. Severe calcifications of the mitral annulus.
8. Additional incidental findings, as above.

## 2020-09-28 IMAGING — CT CT CTA ABD/PEL W/CM AND/OR W/O CM
2 of 7 series · 16 of 36 positions shown · IV contrast (omnipaque)
Comparison: Cardiac CT [DATE].

CLINICAL DATA: 79-year-old female with history of severe aortic
stenosis. Preprocedural study prior to potential transcatheter
aortic valve replacement (TAVR).

EXAM:
CT ANGIOGRAPHY CHEST, ABDOMEN AND PELVIS
TECHNIQUE: Multidetector CT imaging through the chest, abdomen and pelvis was
performed using the standard protocol during bolus administration of
intravenous contrast. Multiplanar reconstructed images and MIPs were
obtained and reviewed to evaluate the vascular anatomy.
CONTRAST:  95mL OMNIPAQUE IOHEXOL 350 MG/ML SOLN

[Series 5: ax thins · axial · 0.68mm/px · z∈[+718,+1293]mm · 15 of 647 slices shown]
[im 36/647  lung]
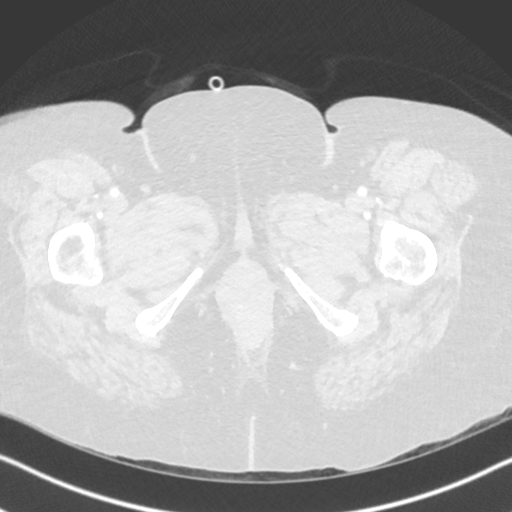
[im 72/647  mediastinal]
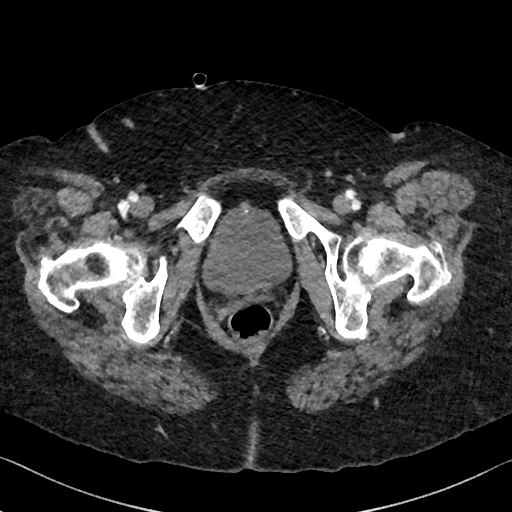
[im 108/647  lung]
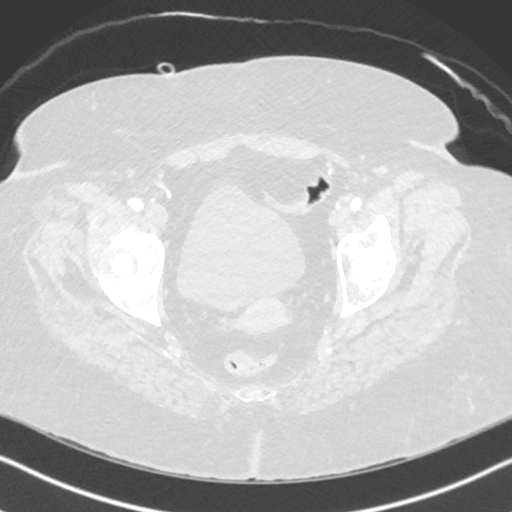
[im 144/647  mediastinal]
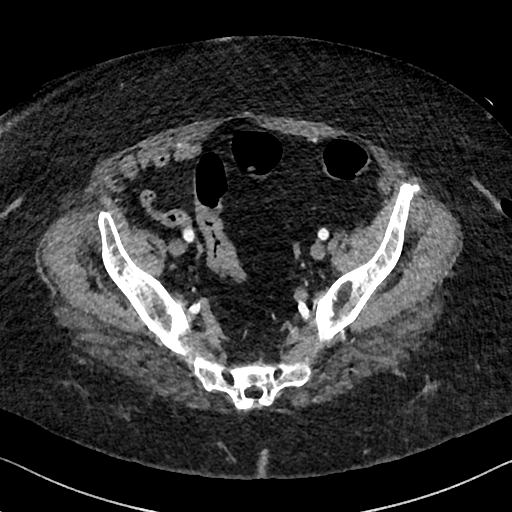
[im 216/647  lung]
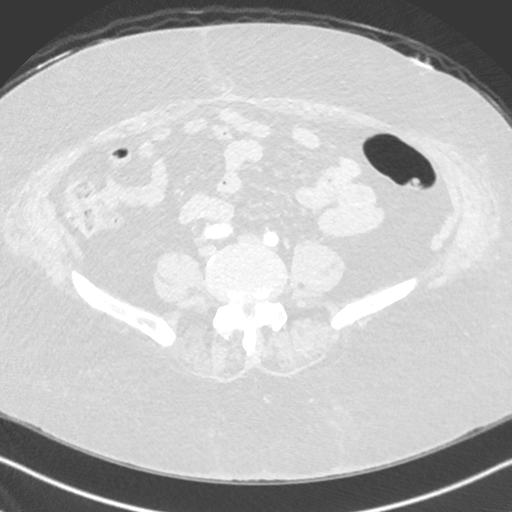
[im 252/647  mediastinal]
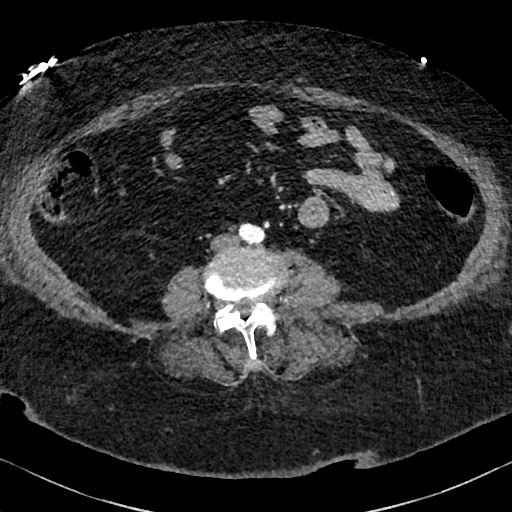
[im 288/647  lung]
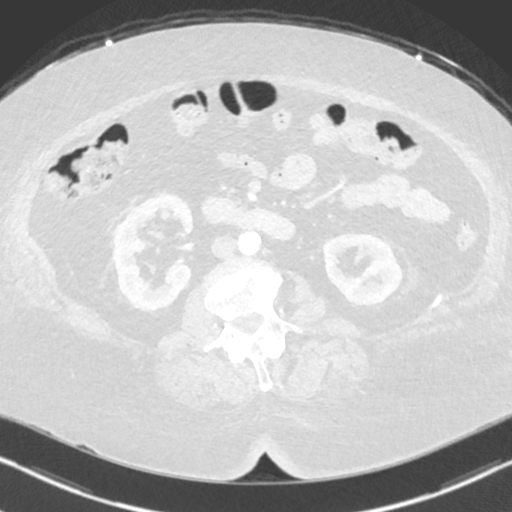
[im 324/647  mediastinal]
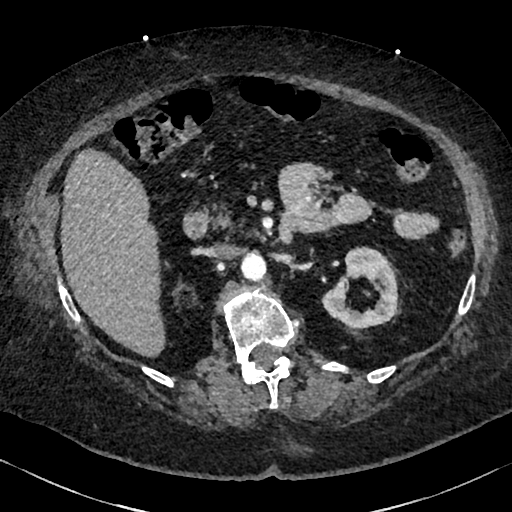
[im 359/647  lung]
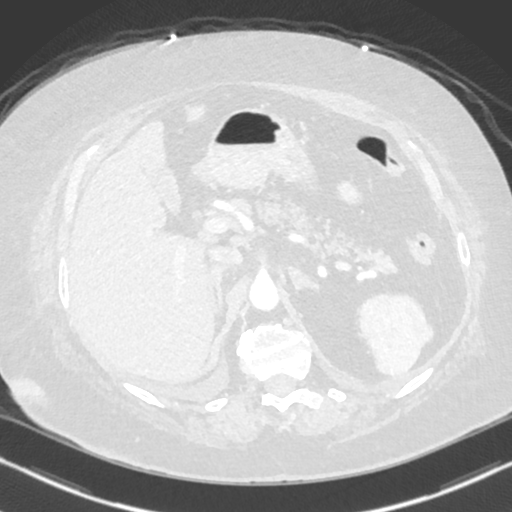
[im 395/647  mediastinal]
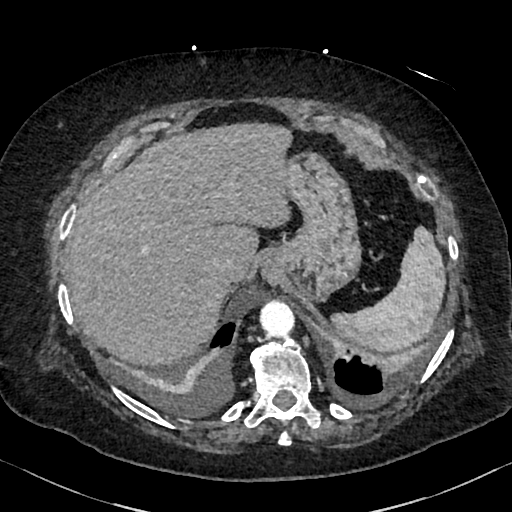
[im 431/647  lung]
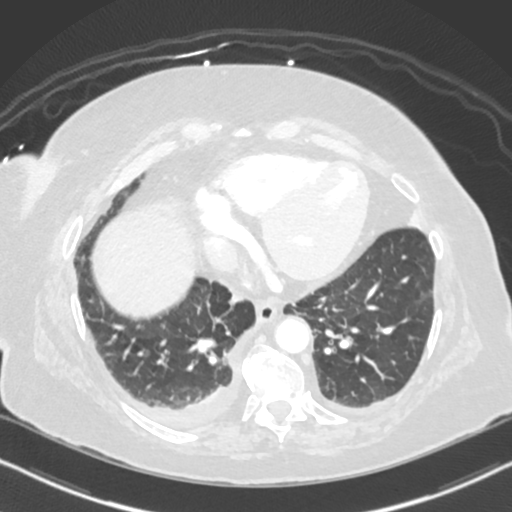
[im 503/647  mediastinal]
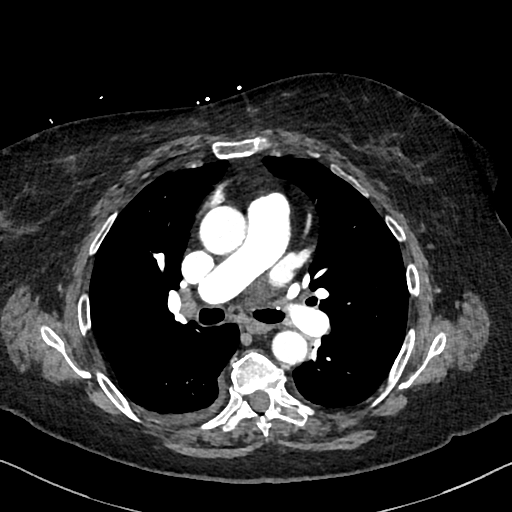
[im 539/647  lung]
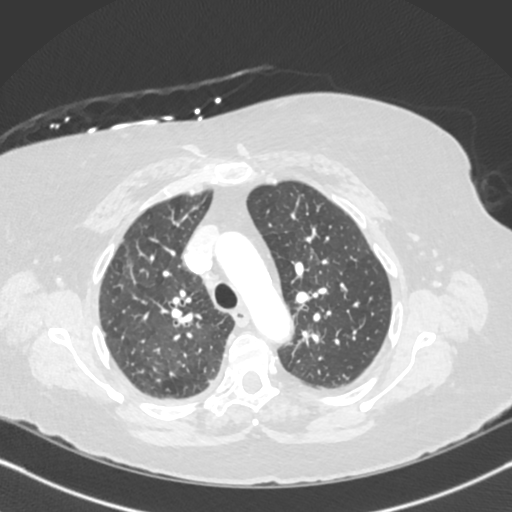
[im 575/647  mediastinal]
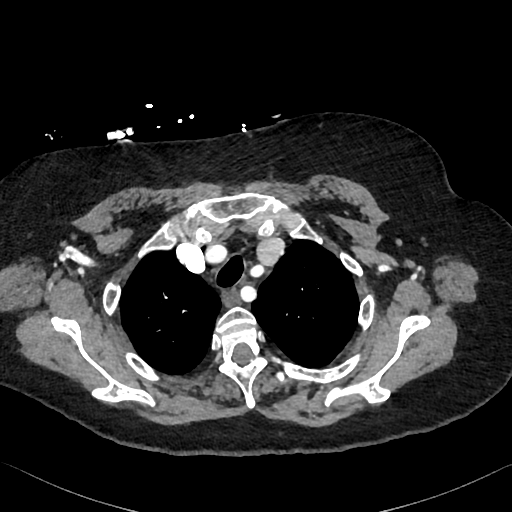
[im 611/647  lung]
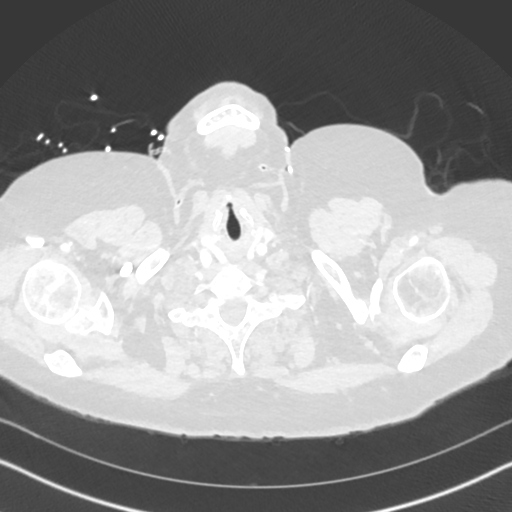

[Series 8: cor · coronal · 0.95mm/px · 1 of 172 slices shown]
[im 86/172  mediastinal]
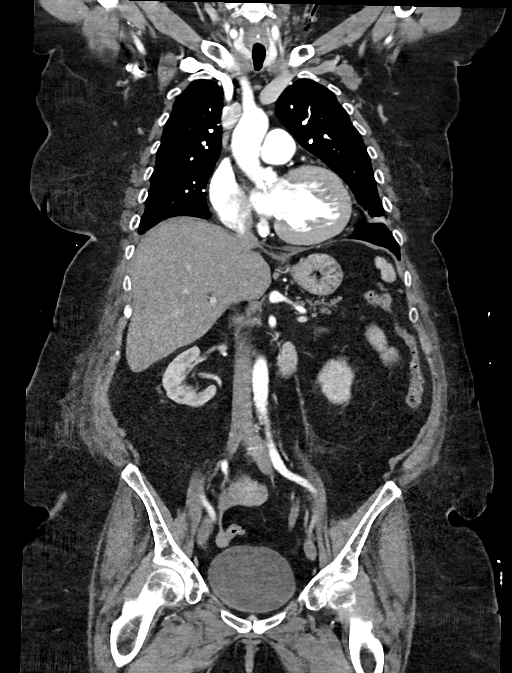

[16 of 36 positions shown; findings below may reference images not displayed]

FINDINGS: CTA CHEST FINDINGS

Cardiovascular: Heart size is normal. There is no significant
pericardial fluid, thickening or pericardial calcification. There is
aortic atherosclerosis, as well as atherosclerosis of the great
vessels of the mediastinum and the coronary arteries, including
calcified atherosclerotic plaque in the left main, left anterior
descending and right coronary arteries. Severe thickening
calcification of the aortic valve. Severe calcifications of the
mitral annulus.

Mediastinum/Lymph Nodes: No pathologically enlarged mediastinal or
hilar lymph nodes. Esophagus is unremarkable in appearance. No
axillary lymphadenopathy.

Lungs/Pleura: Trace bilateral pleural effusions lying dependently
(right greater than left). Associated areas of passive subsegmental
atelectasis in the lower lobes of the lungs bilaterally. No acute
consolidative airspace disease. 5 mm pulmonary nodule in the lateral
segment of the right middle lobe (axial image 61 of series 7).

Musculoskeletal/Soft Tissues: There are no aggressive appearing
lytic or blastic lesions noted in the visualized portions of the
skeleton.

CTA ABDOMEN AND PELVIS FINDINGS

Hepatobiliary: No suspicious cystic or solid hepatic lesions. No
intra or extrahepatic biliary ductal dilatation. Gallbladder is
normal in appearance.

Pancreas: No pancreatic mass. No pancreatic ductal dilatation. No
pancreatic or peripancreatic fluid collections or inflammatory
changes.

Spleen: Unremarkable.

Adrenals/Urinary Tract: Bilateral kidneys and adrenal glands are
normal in appearance. No hydroureteronephrosis. Urinary bladder is
normal in appearance.

Stomach/Bowel: The appearance of the stomach is normal. No
pathologic dilatation of small bowel or colon. The appendix is not
confidently identified and may be surgically absent. Regardless,
there are no inflammatory changes noted adjacent to the cecum to
suggest the presence of an acute appendicitis at this time.

Vascular/Lymphatic: Aortic atherosclerosis, without evidence of
aneurysm or dissection in the abdominal or pelvic vasculature.
Vascular findings and measurements pertinent to potential TAVR
procedure, as detailed below. No lymphadenopathy noted in the
abdomen or pelvis.

Reproductive: Uterus and ovaries are trophic.

Other: No significant volume of ascites.  No pneumoperitoneum.

Musculoskeletal: There are no aggressive appearing lytic or blastic
lesions noted in the visualized portions of the skeleton.

VASCULAR MEASUREMENTS PERTINENT TO TAVR:

AORTA:

Minimal Aortic [4C] x 15 mm

Severity of Aortic Calcification-mild

RIGHT PELVIS:

Right Common Iliac Artery -

Minimal [4C] x 9.1 mm

Tortuosity-mild

Calcification-minimal

Right External Iliac Artery -

Minimal [4C] x 6.5 mm

Tortuosity-moderate

Calcification-none

Right Common Femoral Artery -

Minimal [4C] x 6.9 mm

Tortuosity-mild

Calcification-minimal

LEFT PELVIS:

Left Common Iliac Artery -

Minimal [4C] x 8.8 mm

Tortuosity-mild

Calcification-none

Left External Iliac Artery -

Minimal [4C] x 7.0 mm

Tortuosity-moderate

Calcification-none

Left Common Femoral Artery -

Minimal [4C] x 6.8 mm

Tortuosity-mild

Calcification-minimal

Review of the MIP images confirms the above findings.
IMPRESSION: 1. Vascular findings and measurements pertinent to potential TAVR
procedure, as detailed above.
2. Severe thickening calcification of the aortic valve, compatible
with reported clinical history of severe aortic stenosis.
3. 5 mm pulmonary nodule in the lateral segment of the right middle
lobe. No follow-up needed if patient is low-risk. Non-contrast chest
CT can be considered in 12 months if patient is high-risk. This
recommendation follows the consensus statement: Guidelines for
Management of Incidental Pulmonary Nodules Detected on CT Images:
4. Trace bilateral pleural effusions (right greater than left) with
areas of passive subsegmental atelectasis in the lower lobes of the
lungs bilaterally.
5. Mild cardiomegaly.
6. Aortic atherosclerosis, in addition to left main and 3 vessel
coronary artery disease.
7. Severe calcifications of the mitral annulus.
8. Additional incidental findings, as above.

## 2020-09-28 IMAGING — CT CT HEART MORP W/ CTA COR W/ SCORE W/ CA W/CM &/OR W/O CM
2 of 7 series · 11 of 20 positions shown, 13 images · IV contrast (APPLIED)
Comparison: Cardiac CTA [DATE].
COMPARISON: Cardiac CTA [DATE].

Addendum:
EXAM:
OVER-READ INTERPRETATION  CT CHEST

The following report is an over-read performed by radiologist Dr.
JIAYI [REDACTED] on [DATE]. This
over-read does not include interpretation of cardiac or coronary
anatomy or pathology. The coronary calcium score/coronary CTA
interpretation by the cardiologist is attached.
CLINICAL DATA: Pre-op transcatheter aortic valve replacement (TAVR)
Cardiac TAVR CT
TECHNIQUE: The patient was scanned on a Siemens Force [REDACTED]ice scanner. A 120
kV retrospective scan was triggered in the descending thoracic aorta
at 111 HU's. Gantry rotation speed was 270 msecs and collimation was
.9 mm. The 3D data set was reconstructed in 5% intervals of the R-R
cycle. Systolic and diastolic phases were analyzed on a dedicated
work station using MPR, MIP and VRT modes. The patient received 95mL
OMNIPAQUE IOHEXOL 350 MG/ML SOLN of contrast.

[Series 14: 0-90% · axial · 0.39mm/px · z∈[+1116,+1182]mm · 5 of 1640 slices shown]
[im 274/1640  vessel]
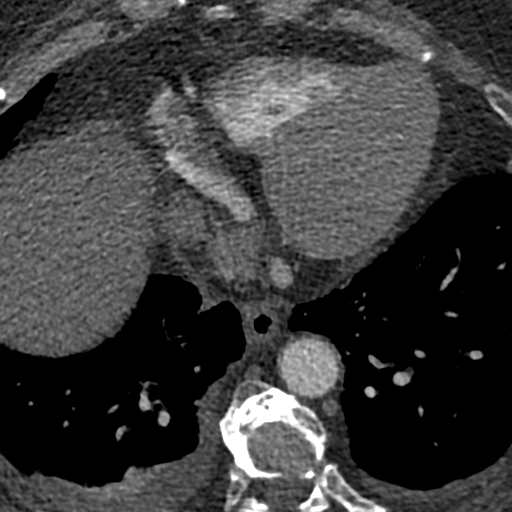
[im 547/1640  vessel]
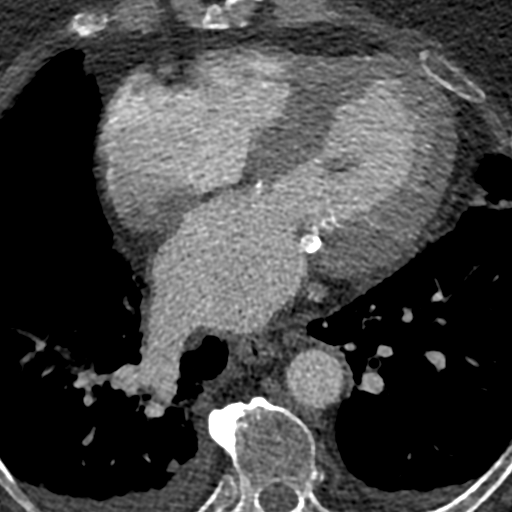
[im 820/1640  vessel]
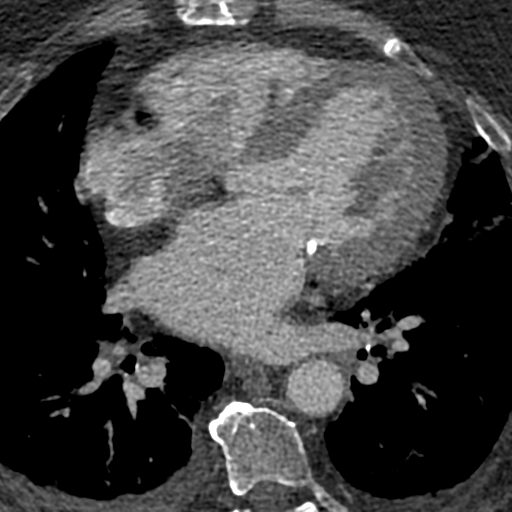
[im 1093/1640  vessel]
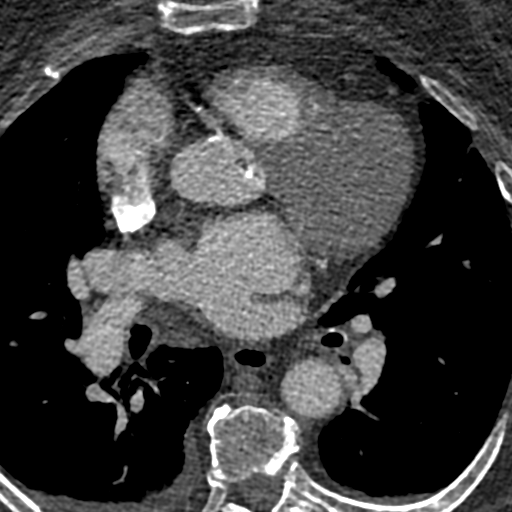
[im 1366/1640  vessel]
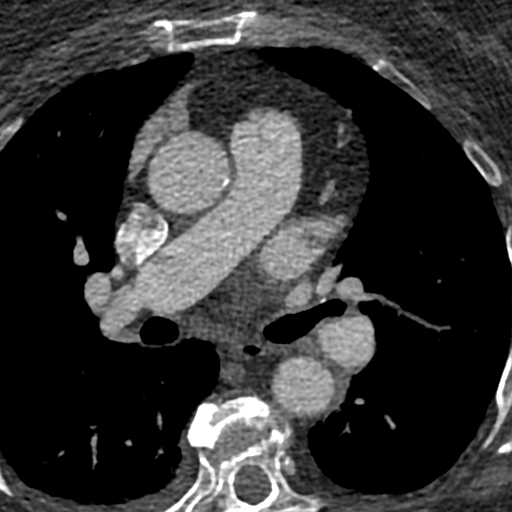

[Series 15: 5-95% · axial · 0.39mm/px · z∈[+1114,+1184]mm · 6 of 1640 slices shown, 8 images]
[im 235/1640  vessel]
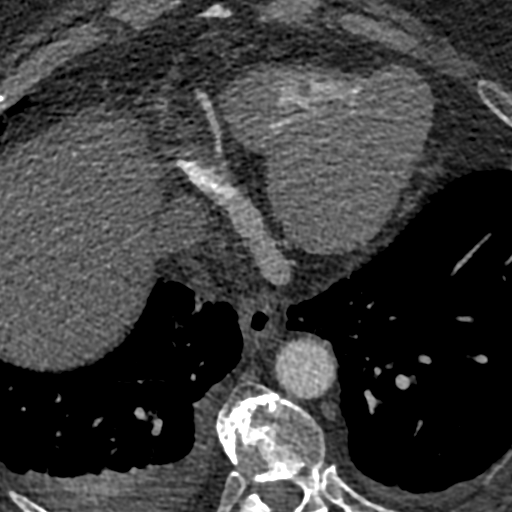
[im 235/1640  lung]
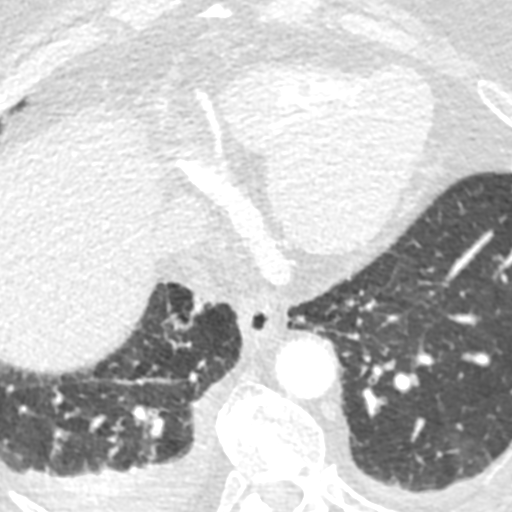
[im 469/1640  vessel]
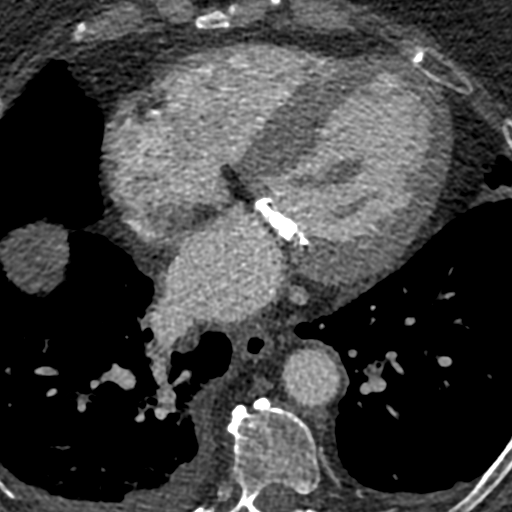
[im 703/1640  vessel]
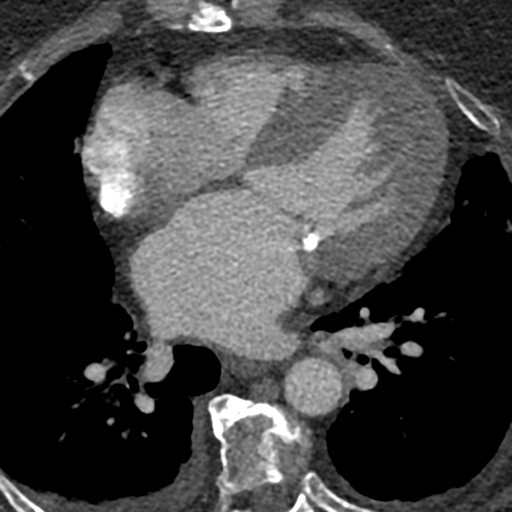
[im 937/1640  vessel]
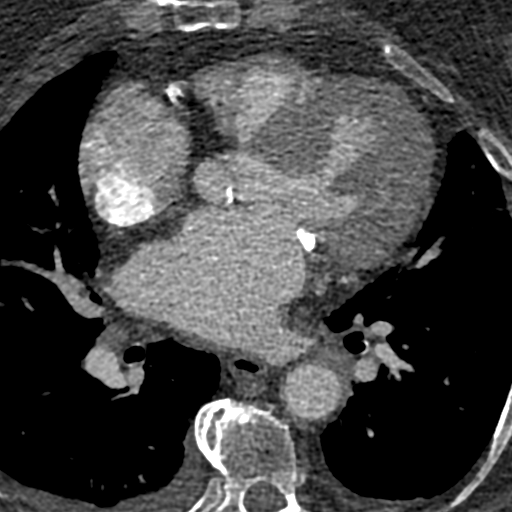
[im 1171/1640  vessel]
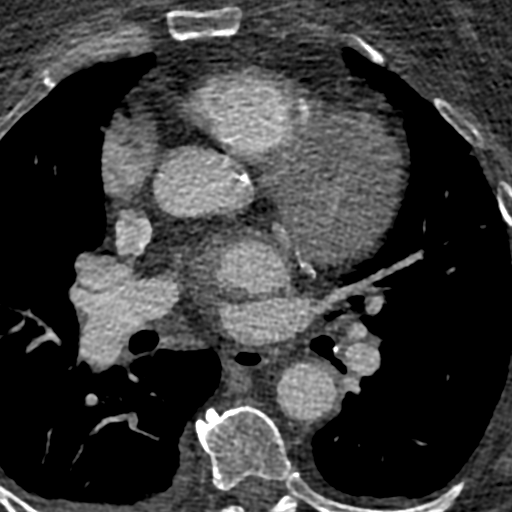
[im 1171/1640  lung]
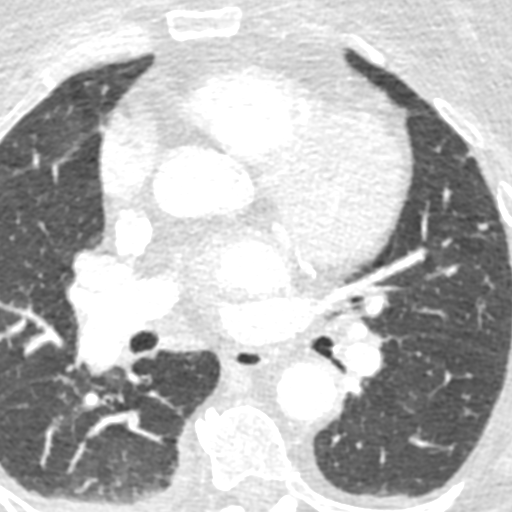
[im 1405/1640  vessel]
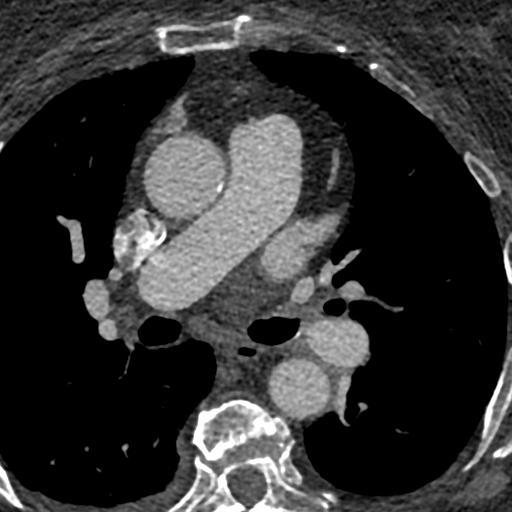

[11 of 20 positions shown; findings below may reference images not displayed]

FINDINGS: Extracardiac findings will be described separately under dictation
for contemporaneously obtained CTA chest, abdomen and pelvis.
IMPRESSION: Please see separate dictation for contemporaneously obtained CTA
chest, abdomen and pelvis dated [DATE] for full description of
relevant extracardiac findings.
FINDINGS: Aortic Valve: Tricuspid aortic valve. Severely reduced cusp
separation. Severely thickened, moderately calcified aortic valve
cusps.

AV calcium score: [UD]

Virtual Basal Annulus Measurements:

Maximum/Minimum Diameter: 23.1 mm x 18.9 mm

Perimeter: 65.4 mm

Area: 328 mm2

No significant LVOT calcifications.

Based on these measurements, the annulus would be suitable for a 20
mm Edwards Sapien 3 valve vs 23 mm [REDACTED] Evolut Pro valve. Sinus
dimensions are borderline for 26 mm [REDACTED] Evolut Pro valve.
Recommend Heart Team discussion for valve sizing.

Sinus of Valsalva Measurements:

Non-coronary:  26 mm

Right - coronary:  26 mm

Left - coronary:  27 mm

Sinus of Valsalva Height:

Left: 17.5 mm

Right: 18.6 mm

Aorta: Mild aortic atherosclerosis.

Sinotubular Junction: 27 mm

Ascending Thoracic Aorta:  31 mm

Aortic Arch:  24 mm

Descending Thoracic Aorta:  22 mm

Coronary Artery Height above Annulus:

Left Main: 13 mm

Right Coronary: 11.6 mm

Coronary Arteries: Normal origins. 3 vessel coronary artery
calcifications.

Optimum Fluoroscopic Angle for Delivery: RAO 1, JIAYI 1

Moderate mitral annular calcification.

No left atrial appendage thrombus.
IMPRESSION: 1. Tricuspid aortic valve. Severely reduced cusp separation.
Severely thickened, moderately calcified aortic valve cusps.

2.  AV calcium score: [UD]

3. Annulus area: 328 mm2, no significant LVOT calcifications. Based
on these measurements, the annulus would be suitable for a 20 mm
Edwards Sapien 3 valve vs 23 mm [REDACTED] Evolut Pro valve. Sinus
dimensions are borderline for 26 mm [REDACTED] Evolut Pro valve.
Recommend Heart Team discussion for valve sizing.

4.  Sufficient coronary artery height from annulus.

5.  Optimum Fluoroscopic Angle for Delivery: RAO 1, JIAYI 1

*** End of Addendum ***
EXAM:
OVER-READ INTERPRETATION  CT CHEST

The following report is an over-read performed by radiologist Dr.
JIAYI [REDACTED] on [DATE]. This
over-read does not include interpretation of cardiac or coronary
anatomy or pathology. The coronary calcium score/coronary CTA
interpretation by the cardiologist is attached.
FINDINGS: Extracardiac findings will be described separately under dictation
for contemporaneously obtained CTA chest, abdomen and pelvis.
IMPRESSION: Please see separate dictation for contemporaneously obtained CTA
chest, abdomen and pelvis dated [DATE] for full description of
relevant extracardiac findings.

## 2020-09-28 MED ORDER — MAGNESIUM SULFATE 2 GM/50ML IV SOLN: 2.0000 g | Freq: Once | INTRAVENOUS | Status: DC

## 2020-09-28 MED ORDER — HYDRALAZINE HCL 20 MG/ML IJ SOLN: 10.0000 mg | INTRAMUSCULAR | Status: DC | PRN

## 2020-09-28 MED ORDER — ALPRAZOLAM 0.25 MG PO TABS: 0.2500 mg | ORAL_TABLET | Freq: Once | ORAL | Status: DC

## 2020-09-28 MED ORDER — POTASSIUM CHLORIDE CRYS ER 20 MEQ PO TBCR
40.0000 meq | EXTENDED_RELEASE_TABLET | Freq: Once | ORAL | Status: AC
  Administered 2020-09-28: 40 meq via ORAL
  Filled 2020-09-28: qty 2

## 2020-09-28 MED ORDER — POTASSIUM CHLORIDE 20 MEQ PO PACK
40.0000 meq | PACK | Freq: Once | ORAL | Status: DC
Start: 2020-09-28 — End: 2020-10-01
  Filled 2020-09-28: qty 2

## 2020-09-28 MED ORDER — MAGNESIUM SULFATE 2 GM/50ML IV SOLN
2.0000 g | Freq: Once | INTRAVENOUS | Status: AC
  Administered 2020-09-28: 2 g via INTRAVENOUS
  Filled 2020-09-28: qty 50

## 2020-09-28 MED ORDER — IOHEXOL 350 MG/ML SOLN
95.0000 mL | Freq: Once | INTRAVENOUS | Status: AC | PRN
  Administered 2020-09-28: 95 mL via INTRAVENOUS

## 2020-09-28 MED ORDER — IRBESARTAN 300 MG PO TABS
300.0000 mg | ORAL_TABLET | Freq: Every day | ORAL | Status: DC
  Administered 2020-09-28 – 2020-09-30 (×3): 300 mg via ORAL
  Filled 2020-09-28 (×3): qty 1

## 2020-09-28 MED ORDER — MAGNESIUM SULFATE 2 GM/50ML IV SOLN
2.0000 g | INTRAVENOUS | Status: AC
  Administered 2020-09-28 (×2): 2 g via INTRAVENOUS
  Filled 2020-09-28 (×2): qty 50

## 2020-09-28 NOTE — Progress Notes (Signed)
Heart Failure Navigator Progress Note  Assessed for Heart & Vascular TOC clinic readiness.  Patient does not meet criteria due to plan for TAVR workup.   Navigator available for reassessment of patient.   Ozella Rocks, MSN, RN Heart Failure Nurse Navigator [phone]

## 2020-09-28 NOTE — Progress Notes (Addendum)
Progress Note  Patient Name: Mary Mccarthy Date of Encounter: 09/28/2020  Primary Cardiologist: Bryan Lemma, MD   Subjective   Mary Mccarthy is sitting up in the recliner states she only slept for 30 minutes last night.  She endorses her mind racing and being quite nervous about her hospitalization and the plan for catheterization.  She is worried that she will not be able to lie flat for the procedure.  Patient endorses that since her benzodiazepine was discontinued she has had difficulty sleeping the past few weeks.  She does feel as though her breathing has improved, she has not been up and walking much and has yet to try to lie flat since admission.  She is not having difficulty with urination and believes that the swelling in her legs has gotten better.  She also endorses some nausea after eating her cereal.  No episodes of vomiting.  Inpatient Medications    Scheduled Meds:  ALPRAZolam  0.25 mg Oral Once   apixaban  5 mg Oral BID   atorvastatin  40 mg Oral Daily   carvedilol  6.25 mg Oral BID WC   docusate sodium  100 mg Oral BID   furosemide  40 mg Intravenous BID   insulin aspart  0-15 Units Subcutaneous TID WC   pantoprazole  40 mg Oral Daily   potassium chloride  40 mEq Oral Once   sodium chloride flush  3 mL Intravenous Q12H   Continuous Infusions:  PRN Meds: acetaminophen **OR** acetaminophen, benzonatate, bisacodyl, hydrALAZINE, morphine injection, ondansetron **OR** ondansetron (ZOFRAN) IV, oxyCODONE, polyethylene glycol, traZODone   Vital Signs    Vitals:   09/27/20 2206 09/27/20 2341 09/28/20 0123 09/28/20 0401  BP: (!) 188/53 (!) 183/71 (!) 180/64 (!) 178/62  Pulse:  69 65 61  Resp:  20 20 20   Temp:  97.9 F (36.6 C) 97.7 F (36.5 C) 97.7 F (36.5 C)  TempSrc:  Oral Oral Oral  SpO2:  97% 97% 100%  Weight:    91.2 kg  Height:        Intake/Output Summary (Last 24 hours) at 09/28/2020 0747 Last data filed at 09/28/2020 0402 Gross per 24 hour  Intake  480 ml  Output 1300 ml  Net -820 ml   Filed Weights   09/27/20 0644 09/28/20 0401  Weight: 103 kg 91.2 kg    Telemetry    Normal sinus rhythm- Personally Reviewed  ECG   NSR rate of 65 bpm with PACs present- Personally Reviewed  Physical Exam   GEN: Mild distress Neck: JVD base of the neck Cardiac: RRR, 3/6 crescendo decrescendo systolic murmur best heard over right second intercostal space Respiratory: Saturating well on 2 L nasal cannula, no respiratory distress.  Crackles bilateral lung bases. GI: Soft, nontender, non-distended  MS: Trace lower extremity edema. Neuro:  Nonfocal  Psych: Anxious appearing  Labs    Chemistry Recent Labs  Lab 09/27/20 0705 09/27/20 0739 09/28/20 0309  NA 140 143 138  K 3.4* 3.5 3.3*  CL 105 105 98  CO2 25  --  30  GLUCOSE 109* 106* 127*  BUN 15 18 15   CREATININE 0.80 0.70 0.90  CALCIUM 9.2  --  8.9  PROT 6.8  --   --   ALBUMIN 3.3*  --   --   AST 21  --   --   ALT 16  --   --   ALKPHOS 46  --   --   BILITOT 1.7*  --   --  GFRNONAA >60  --  >60  ANIONGAP 10  --  10     Hematology Recent Labs  Lab 09/27/20 0705 09/27/20 0739 09/28/20 0309  WBC 6.7  --  7.1  RBC 3.57*  --  3.41*  HGB 11.5* 11.2* 11.1*  HCT 34.6* 33.0* 32.7*  MCV 96.9  --  95.9  MCH 32.2  --  32.6  MCHC 33.2  --  33.9  RDW 13.4  --  13.5  PLT 270  --  254    Cardiac EnzymesNo results for input(s): TROPONINI in the last 168 hours. No results for input(s): TROPIPOC in the last 168 hours.   BNP Recent Labs  Lab 09/27/20 0705  BNP 398.1*     DDimer No results for input(s): DDIMER in the last 168 hours.   Radiology    DG Chest Port 1 View  Result Date: 09/27/2020 CLINICAL DATA:  Shortness of breath for 2 days, history coronary artery disease post MI, hypertension, type II diabetes mellitus EXAM: PORTABLE CHEST 1 VIEW COMPARISON:  Portable exam 0741 hours compared to 08/10/2020 FINDINGS: Enlargement of cardiac silhouette with pulmonary  vascular congestion. Mediastinal contours normal. Mild interstitial prominence in lower lungs, question due to hypoinflation or minimal edema. No pleural effusion, segmental consolidation, or pneumothorax. Bones demineralized. IMPRESSION: Enlargement of cardiac silhouette with pulmonary vascular congestion. Interstitial prominence in the lower lungs question due to hypoinflation or minimal pulmonary edema. Electronically Signed   By: Ulyses Southward M.D.   On: 09/27/2020 08:09    Cardiac Studies   TTE 07/22 1. The aortic valve is calcified. Aortic valve regurgitation is not  visualized. Severe aortic valve stenosis. Aortic valve area, by VTI  measures 0.86 cm. Aortic valve mean gradient measures 42.0 mmHg. Aortic  valve Vmax measures 4.36 m/s.   2. Left ventricular ejection fraction, by estimation, is 55 to 60%. The  left ventricle has normal function. The left ventricle has no regional  wall motion abnormalities. There is severe concentric left ventricular  hypertrophy. Left ventricular diastolic   parameters are indeterminate.   3. Right ventricular systolic function is normal. The right ventricular  size is normal. There is moderately elevated pulmonary artery systolic  pressure.   4. Left atrial size was mildly dilated.   5. The pericardial effusion is circumferential.   6. The mitral valve is abnormal. Trivial mitral valve regurgitation. The  mean mitral valve gradient is 2.0 mmHg with average heart rate of 53 bpm.  Severe mitral annular calcification.   TTE 10/01/2019:  EF 65 to 70%.  Normal RWMA, G1 DD-elevated LAP.  Mildly elevated PAP.  Moderate LA dilation.  Moderate aortic valve stenosis.  (Mean gradient 34.5). ->  Probably still stable.  2019 mean gradient was 36, then reduced to 31 this February 21 -> now 34.5.  Coronary Calcium Score-CTA 10/09/2019: Calcium score 624.  82nd percentile. Dominant RCA: Mild (25-49%) proximal stenosis-distal bifurcation into PDA and PAV--< RPL branches.   LAD (1 major mid vessel diagonal) diffuse calcified plaque, mild proximal stenosis with minimal distal stenosis.  Small RI moderate ostial disease.  LCx-moderate mixed (50-69%) proximal stenosis.  Small distal OM1 disease.  Trileaflet aortic valve annular calcification of probable stenosis.   LHC 2017 Ost Ramus lesion, 60% stenosed. Ost RCA lesion, 30% stenosed. Dist LAD lesion, tapers to a small vessel with diffuse roughly 40-50% stenosis. The left ventricular systolic function is normal. Elevated LVEDP  Patient Profile     80 y.o. female hx of moderate-severe aortic  stenosis, HFpEF, non obstructive CAD, atrial fibrillation on eliquis, CVA, HTN, pulmonary HTN who is being seen today for the evaluation of acute heart failure exacerbation at the request of [facility].  Assessment & Plan    Acute on Chronic Diastolic Heart Failure Exacerbation History of non-ischemic HFpEF, recent echo 07/22 EF 55-60%. Responding to IV lasix 40 mg BID. 24 hr I/O of -820 mL, cumulative I/O - .  Weight down 12 kg from admission, not likely accurate with minimal I/O's. JVD and lower extremity swelling improving. Creatinine has remained stable. Mg of 0.80, 2g IV mag sulfate ordered now, will give additional 2g in the afternoon. K of 3.3, klor-con packet ordered, if unable to tolerate 2/2 nausea, can switch to pills. Continue to manage hypertension as per below and pre-TAVR workup initiated.  -continue IV lasix 40 mg BID, consider increasing dose.  -continue carvedilol 6.25 BID, no signs of hypoperfusion on exam -Mg of .8, IV mag sulfate ordered -K 3.3, klorcon packet ordered -strict I/O and daily weights -daily weights/strict I/O's -work with PT and ambulate w/wo O2 to determine oxygen needs  Hypertensive Urgency Continues to be hypertensive and likely contributing to her HF exacerbation. Has received PRN hydralazine and continued on carvedilol. Plan to restart irbesartan today.  -continue  coreg 6.25 BID -start home irbesartan 300 mg daily -continue diuretic therapy as per above   Aortic Stenosis Hx of moderate-severe aortic stenosis. Last echocardiogram 7/22 with aortic valve features of peak velocity 4.36 m/s, mean gradient 42 mm Hg, AV area 0.86 cm, dimensionless index 0.27. Planning for aortic valve replacement, pre-TAVR workup during this admission. Once euvolemic and able to lie flat, plan for heart catheterization.  -continue to diurese, perform hearth catheterization once euvolemic.  -CT coronary ordered -CTA chest,aorta, abd/pelvis ordered -PT eval pending   Atrial Fibrillation NSR on telemetry, no incidence of arrhythmia on telemetry.  -continue cardiac monitoring -continue eliquis 5 mg BID, carvedilol 6.25 mg BID    Hx of non-occlusive CAD LHC in 2017, Ost ramus lesion 60% stenosed, Ost RCA lesion 30% stenosed, distal LAD tapers to small vessel with diffuse roughly 40-50% stenosis. Do not suspect acute ACS as etiology of heart failure exacerbation.  -continue atorvastatin 40 mg daily and DM management -CT coronary pending -L&RHC once she is able to lie flat.    Hx of CVA Patient admitted 07/22 with shower of strokes thought to be embolic in etiology 2/2 atrial fibrillation. She was started on eliquis and has been adherent to therapy.  -continue eliquis -continue statin therapy and DM management   HLD Continue atorvastatin 40 mg daily. Last lipid panel 07/22, total C of 97 and LDL of 48.    T2DM Last A1c of 7.4 07/22, medication regimen of metformin and glimepiride.  -SSI, management per primary team  Hx of Anxiety Disorder Per recent documentation PCP discontinued her benzodiazepine recently. Since this time, patient has been unable to sleep and has been quite anxious.  -management per primary team    For questions or updates, please contact [facility] Please consult [email or web address] for contact info under Cardiology/STEMI.   Signed, Thalia Bloodgood DO  Internal Medicine Resident PGY-2 [place]  Pager: [phone]  I have personally seen and examined this patient with Dr. Evie Lacks. I agree with the assessment and plan as outlined above. She has diuresed well and now has no appreciable LE edema. She reports improvement in her dyspnea. She remains anxious. Continue IV Lasix today. If she can lay flat tomorrow,  will consider right and left heart cath to begin her workup for TAVR. We can also plan her pre-TAVR CT scans while she is admitted if her renal function remains normal following her cardiac cath.   Verne Carrow 09/28/2020 10:57 AM

## 2020-09-28 NOTE — Progress Notes (Signed)
[facility] night shift telemetry coverage note.  The nursing staff reported that the patient is anxious and restless.  She has been having left rib cage pain and headache.  She has not being able to sleep despite receiving oxycodone earlier.  Her blood pressure has been trending down but still significantly elevated despite receiving 5 mg IVP of as needed hydralazine.  Alprazolam 0.25 mg p.o. x1 and I have increased hydralazine to 10 mg every 4 hours as needed.  Sanda Klein, MD.

## 2020-09-28 NOTE — Therapy (Signed)
OT Cancellation Note  Patient Details Name: Mary Mccarthy MRN: [id number] DOB: [birth year]   Cancelled Treatment:    Reason Eval/Treat Not Completed: Medical issues which prohibited therapy;Patient not medically ready. Orders received and chart reviewed. Pt with critical lab values and feeling unwell. Will check back for OT assessment next available date or as schedule allows.  Mariam Dollar Beth Dixon, OTR/L 09/28/2020, 10:31 AM

## 2020-09-28 NOTE — Progress Notes (Signed)
PT Cancellation Note  Patient Details Name: Mary Mccarthy MRN: [id number] DOB: Mar 17, 1940   Cancelled Treatment:    Reason Eval/Treat Not Completed: Patient not medically ready. RN reporting pt's magnesium was at a critical value and pt was nauseated and feeling unwell at this time. RN requesting PT follow-up later as time permits.   [name], PT, DPT Acute Rehabilitation Services  Pager: [phone] Office: [phone]    [name] 09/28/2020, 10:01 AM

## 2020-09-28 NOTE — Progress Notes (Signed)
Pt is having left sided rib cage/side pain & headache, says feels hot, restless,uncomfortable. When RN walked in room, patient was visible distressed, she was moving her legs back and forth and fanning herself, even though the room was cold. She says she doesn't normally get hot, but she felt really hot and also like her feet were burning. RN did VS and EKG, since patient pointed to her left side complaining of pain. Providers notified.

## 2020-09-28 NOTE — Evaluation (Signed)
Physical Therapy Evaluation Patient Details Name: Mary Mccarthy MRN: [id number] DOB: [birth year] Today's Date: 09/28/2020   History of Present Illness  Pt is an 80 y.o. female who presented 09/27/20 with SOB and chest tightness. Pt admitted with acute on chronic diastolic CHF, complicated by severe AS. PMH: CVA 7/22, CAD, HTN, HLD, afib on Eliquis, obesity, DM2, anemia, MI, aortic stenosis, osteoporosis   Clinical Impression  Pt presents with condition above and deficits mentioned below, see PT Problem List. PTA, she was living with her son in a 1-level house with 1 STE with bil handrails. Pt has had recent CVAs affecting her L side, but has recently progressed from using a rollator to only using a quad-cane with community mobility. Prior to her CVAs she was independent without AD/AE. Currently, pt is displaying deficits in strength, balance, and activity tolerance that place her at risk for falls. Pt had x1 LOB when trying to ambulate without UE support, needing minA to recover. Pt was able to demonstrate safe, but slow, gait using a RW, only needing min guard assist. Recommending follow-up with HHPT to maximize her safety and independence with all functional mobility. Will continue to follow acutely.    Follow Up Recommendations Home health PT;Supervision for mobility/OOB    Equipment Recommendations  None recommended by PT    Recommendations for Other Services       Precautions / Restrictions Precautions Precautions: Fall Precaution Comments: monitor SpO2 Restrictions Weight Bearing Restrictions: No      Mobility  Bed Mobility               General bed mobility comments: Pt sitting up in recliner start and end of session.    Transfers Overall transfer level: Needs assistance Equipment used: None Transfers: Sit to/from Stand Sit to Stand: Min guard         General transfer comment: Min guard for safety, no LOB, but increased time to power up to  stand.  Ambulation/Gait Ambulation/Gait assistance: Min guard;Min assist Gait Distance (Feet): 150 Feet Assistive device: Rolling walker (2 wheeled);None Gait Pattern/deviations: Step-through pattern;Decreased stride length;Shuffle;Trunk flexed Gait velocity: reduced Gait velocity interpretation: <1.8 ft/sec, indicate of risk for recurrent falls General Gait Details: Initially ambulating without AD/UE support but pt with unsteadiness and x1 LOB needing minA to recover. Thus, obtained RW with noted improved stability and independence, only needing min guard assist for safety, no LOB. Pt with slow, shuffling steps and kyphotic posture.  Stairs            Wheelchair Mobility    Modified Rankin (Stroke Patients Only)       Balance Overall balance assessment: Needs assistance Sitting-balance support: No upper extremity supported;Feet supported Sitting balance-[name]: Good Sitting balance - Comments: Able to reach off BOS without LOB, supervision for safety.   Standing balance support: Single extremity supported;No upper extremity supported;Bilateral upper extremity supported;During functional activity Standing balance-[name]: Fair Standing balance comment: Stands and reaches min off BOS without UE support, but for further reaching she supports self with 1 UE on a surface. Benefits from bil UE support for improved stability with mobility.                             Pertinent Vitals/Pain Pain Assessment: Faces Faces Pain Scale: Hurts a little bit Pain Location: generalized stiffness with mobility Pain Descriptors / Indicators: Other (Comment) ("stiff") Pain Intervention(s): Monitored during session;Limited activity within patient's tolerance;Repositioned    Home  Living Family/patient expects to be discharged to:: Private residence Living Arrangements: Children (son) Available Help at Discharge: Family;Available 24 hours/day Type of Home: House Home  Access: Stairs to enter Entrance Stairs-Rails: Can reach both Entrance Stairs-Number of Steps: 1 Home Layout: One level Home Equipment: Walker - 2 wheels;Cane - single point;Walker - 4 wheels;Cane - quad;Bedside commode;Shower seat - built in (built in shower seat too small for pt) Additional Comments: son works    Prior Function Level of Independence: Independent with assistive device(s)         Comments: hx of recent CVAs still affecting L side slightly. Following recent CVAs, recently progressed away from rollator to only using quad-cane for mobility in the community. No longer driving. Prior to CVAs, was independent and driving.     Hand Dominance   Dominant Hand: Right    Extremity/Trunk Assessment   Upper Extremity Assessment Upper Extremity Assessment: Overall WFL for tasks assessed    Lower Extremity Assessment Lower Extremity Assessment: Generalized weakness    Cervical / Trunk Assessment Cervical / Trunk Assessment: Kyphotic  Communication   Communication: No difficulties  Cognition Arousal/Alertness: Awake/alert Behavior During Therapy: WFL for tasks assessed/performed Overall Cognitive Status: Within Functional Limits for tasks assessed                                        General Comments General comments (skin integrity, edema, etc.): SpO2 >/= 95% on RA during gait, 88% on RA sitting after gait, re-donned  with rebound to >/= 97%; hx of recent CVAs still affecting L side slightly    Exercises     Assessment/Plan    PT Assessment Patient needs continued PT services  PT Problem List Decreased strength;Decreased activity tolerance;Decreased balance;Decreased mobility;Decreased coordination       PT Treatment Interventions DME instruction;Gait training;Stair training;Functional mobility training;Therapeutic activities;Therapeutic exercise;Balance training;Neuromuscular re-education;Patient/family education    PT Goals (Current goals  can be found in the Care Plan section)  Acute Rehab PT Goals Patient Stated Goal: to have surgery PT Goal Formulation: With patient Time For Goal Achievement: 10/12/20 Potential to Achieve Goals: Good    Frequency Min 3X/week   Barriers to discharge        Co-evaluation               AM-PAC PT "6 Clicks" Mobility  Outcome Measure Help needed turning from your back to your side while in a flat bed without using bedrails?: A Little Help needed moving from lying on your back to sitting on the side of a flat bed without using bedrails?: A Little Help needed moving to and from a bed to a chair (including a wheelchair)?: A Little Help needed standing up from a chair using your arms (e.g., wheelchair or bedside chair)?: A Little Help needed to walk in hospital room?: A Little Help needed climbing 3-5 steps with a railing? : A Little 6 Click Score: 18    End of Session Equipment Utilized During Treatment: Gait belt;Oxygen Activity Tolerance: Patient tolerated treatment well Patient left: in chair;with call bell/phone within reach;with chair alarm set Nurse Communication: Mobility status PT Visit Diagnosis: Unsteadiness on feet (R26.81);Other abnormalities of gait and mobility (R26.89);Muscle weakness (generalized) (M62.81);Difficulty in walking, not elsewhere classified (R26.2)    Time: 1610-9604 PT Time Calculation (min) (ACUTE ONLY): 29 min   Charges:   PT Evaluation $PT Eval Moderate Complexity: 1 Mod PT  Treatments $Gait Training: 8-22 mins        [name], PT, DPT Acute Rehabilitation Services  Pager: [phone] Office: [phone]   [name] 09/28/2020, 1:15 PM

## 2020-09-28 NOTE — Plan of Care (Signed)
  Problem: Education: Goal: Knowledge of General Education information will improve Description: Including pain rating scale, medication(s)/side effects and non-pharmacologic comfort measures Outcome: Progressing   Problem: Health Behavior/Discharge Planning: Goal: Ability to manage health-related needs will improve Outcome: Progressing   Problem: Clinical Measurements: Goal: Respiratory complications will improve Outcome: Progressing   Problem: Clinical Measurements: Goal: Cardiovascular complication will be avoided Outcome: Progressing   

## 2020-09-28 NOTE — Progress Notes (Signed)
PROGRESS NOTE    Mary Mccarthy  FIE:[id number] DOB: 30-Mar-1940 DOA: 09/27/2020 PCP: Corwin Levins, MD    Brief Narrative: This 80 years old female with PMH significant for CAD, HTN, HLD, A. fib on Eliquis, morbid obesity and DM presented in the ED with C/O: chest tightness and shortness of breath.  Patient reports having flulike symptoms but tested negative for COVID.  Her primary care physician has prescribed doxycycline,  Tessalon Perles and over-the-counter Mucinex.  She reports feeling better by end of the week but she felt sick again.  She felt very volume overloaded and developed cough with shortness of breath. She was found to have severe aortic stenosis on her previous hospitalization. She was due to see cardiology last week but due to her sickness it was rescheduled.  Cardiology is planning TAVR evaluation. Patient is admitted for CHF exacerbation.  Started on IV diuresis.  Cardiology consulted  Assessment & Plan:   Principal Problem:   Acute on chronic diastolic CHF (congestive heart failure) (HCC) Active Problems:   Type 2 diabetes mellitus with hyperglycemia, without long-term current use of insulin (HCC)   Hyperlipidemia with target LDL less than 70   Morbid obesity (HCC)   Severe aortic stenosis   Paroxysmal atrial fibrillation (HCC)  Acute on chronic diastolic CHF exacerbation in the setting of severe aortic stenosis Patient presented with shortness of breath, hypoxia requiring nonrebreather. Chest x-ray consistent with mild pulmonary edema, BNP elevated. Patient was given Lasix 80 mg IV once.  Felt better now on nasal cannula. Now she is saturating on room air 94%. Continue Lasix 40 mg IV twice daily, negative balance of 1 L/ 24 hr Daily weight,  intake and output charting. Cardiology consulted, patient is in process for TAVR evaluation.  Essential hypertension: She continues to remain hypertensive which might be contributing to her exacerbation Continue Coreg,  Avapro and diuretics. Add hydralazine as needed.  Aortic stenosis: She does have history of moderate- severe aortic stenosis. Cardiology planning for aortic valve replacement,. Pre- TAVR work-up during this admission. Once euvolemic and able to lie flat, plan for left heart cath  Hyperlipidemia :  Continue Lipitor   Diabetes mellitus: Continue sliding scale,  hold Glucophage and Amaryl   Atrial fibrillation: Rate controlled,  continue Eliquis and Coreg   Morbid obesity: Outpatient PCP follow-up.  History of CVA: Continue Eliquis and she has been adherent to treatment Continues statin.    DVT prophylaxis: Eliquis Code Status: Full code Family Communication: No family at bedside Disposition Plan:  Status is: Inpatient  Remains inpatient appropriate because:Inpatient level of care appropriate due to severity of illness  Dispo: The patient is from: Home              Anticipated d/c is to: Home              Patient currently is not medically stable to d/c.   Difficult to place patient No  Consultants:  Cardiology  Procedures:   Antimicrobials:   Anti-infectives (From admission, onward)    None       Subjective: Patient was seen and examined at bedside.  Overnight events noted.   She reports feeling much improved after diuresis.  Objective: Vitals:   09/28/20 0401 09/28/20 0840 09/28/20 0844 09/28/20 1556  BP: (!) 178/62 (!) 161/69 (!) 166/106 (!) 179/50  Pulse: 61 65 70 70  Resp: 20 20  20   Temp: 97.7 F (36.5 C)  97.6 F (36.4 C) 98 F (36.7 C)  TempSrc: Oral Oral Oral Oral  SpO2: 100%  99% 96%  Weight: 91.2 kg     Height:        Intake/Output Summary (Last 24 hours) at 09/28/2020 1606 Last data filed at 09/28/2020 1210 Gross per 24 hour  Intake 363 ml  Output 2275 ml  Net -1912 ml   Filed Weights   09/27/20 0644 09/28/20 0401  Weight: 103 kg 91.2 kg    Examination:  General exam: Appears comfortable, not in any acute  distress. Respiratory system: Clear to auscultation bilaterally, no wheezes Cardiovascular system: S1 & S2 heard, regular rate and rhythm.  No murmur.   Gastrointestinal system: Abdomen is soft, nontender, nondistended.  Bowel sounds normal Central nervous system: Alert and oriented. No focal neurological deficits. Extremities: No edema, no cyanosis, no clubbing. Skin: No rashes, lesions or ulcers Psychiatry: Judgement and insight appear normal. Mood & affect appropriate.     Data Reviewed: I have personally reviewed following labs and imaging studies  CBC: Recent Labs  Lab 09/27/20 0705 09/27/20 0739 09/28/20 0309  WBC 6.7  --  7.1  NEUTROABS 4.5  --   --   HGB 11.5* 11.2* 11.1*  HCT 34.6* 33.0* 32.7*  MCV 96.9  --  95.9  PLT 270  --  254   Basic Metabolic Panel: Recent Labs  Lab 09/27/20 0705 09/27/20 0739 09/28/20 0309  NA 140 143 138  K 3.4* 3.5 3.3*  CL 105 105 98  CO2 25  --  30  GLUCOSE 109* 106* 127*  BUN 15 18 15   CREATININE 0.80 0.70 0.90  CALCIUM 9.2  --  8.9  MG  --   --  0.8*   GFR: Estimated Creatinine Clearance: 50.5 mL/min (by C-G formula based on SCr of 0.9 mg/dL). Liver Function Tests: Recent Labs  Lab 09/27/20 0705  AST 21  ALT 16  ALKPHOS 46  BILITOT 1.7*  PROT 6.8  ALBUMIN 3.3*   No results for input(s): LIPASE, AMYLASE in the last 168 hours. No results for input(s): AMMONIA in the last 168 hours. Coagulation Profile: No results for input(s): INR, PROTIME in the last 168 hours. Cardiac Enzymes: No results for input(s): CKTOTAL, CKMB, CKMBINDEX, TROPONINI in the last 168 hours. BNP (last 3 results) No results for input(s): PROBNP in the last 8760 hours. HbA1C: No results for input(s): HGBA1C in the last 72 hours. CBG: Recent Labs  Lab 09/27/20 2159 09/28/20 0558 09/28/20 0850 09/28/20 1055 09/28/20 1603  GLUCAP 110* 117* 204* 152* 120*   Lipid Profile: No results for input(s): CHOL, HDL, LDLCALC, TRIG, CHOLHDL, LDLDIRECT  in the last 72 hours. Thyroid Function Tests: No results for input(s): TSH, T4TOTAL, FREET4, T3FREE, THYROIDAB in the last 72 hours. Anemia Panel: No results for input(s): VITAMINB12, FOLATE, FERRITIN, TIBC, IRON, RETICCTPCT in the last 72 hours. Sepsis Labs: No results for input(s): PROCALCITON, LATICACIDVEN in the last 168 hours.  Recent Results (from the past 240 hour(s))  Resp Panel by RT-PCR (Flu A&B, Covid) Nasopharyngeal Swab     Status: None   Collection Time: 09/27/20  6:40 AM   Specimen: Nasopharyngeal Swab; Nasopharyngeal(NP) swabs in vial transport medium  Result Value Ref Range Status   SARS Coronavirus 2 by RT PCR NEGATIVE NEGATIVE Final    Comment: (NOTE) SARS-CoV-2 target nucleic acids are NOT DETECTED.  The SARS-CoV-2 RNA is generally detectable in upper respiratory specimens during the acute phase of infection. The lowest concentration of SARS-CoV-2 viral copies this assay can detect is  138 copies/mL. A negative result does not preclude SARS-Cov-2 infection and should not be used as the sole basis for treatment or other patient management decisions. A negative result may occur with  improper specimen collection/handling, submission of specimen other than nasopharyngeal swab, presence of viral mutation(s) within the areas targeted by this assay, and inadequate number of viral copies(<138 copies/mL). A negative result must be combined with clinical observations, patient history, and epidemiological information. The expected result is Negative.  Fact Sheet for Patients:  BloggerCourse.com  Fact Sheet for Healthcare Providers:  SeriousBroker.it  This test is no t yet approved or cleared by the [place] FDA and  has been authorized for detection and/or diagnosis of SARS-CoV-2 by FDA under an Emergency Use Authorization (EUA). This EUA will remain  in effect (meaning this test can be used) for the duration of  the COVID-19 declaration under Section 564(b)(1) of the Act, 21 U.S.C.section 360bbb-3(b)(1), unless the authorization is terminated  or revoked sooner.       Influenza A by PCR NEGATIVE NEGATIVE Final   Influenza B by PCR NEGATIVE NEGATIVE Final    Comment: (NOTE) The Xpert Xpress SARS-CoV-2/FLU/RSV plus assay is intended as an aid in the diagnosis of influenza from Nasopharyngeal swab specimens and should not be used as a sole basis for treatment. Nasal washings and aspirates are unacceptable for Xpert Xpress SARS-CoV-2/FLU/RSV testing.  Fact Sheet for Patients: BloggerCourse.com  Fact Sheet for Healthcare Providers: SeriousBroker.it  This test is not yet approved or cleared by the [place] FDA and has been authorized for detection and/or diagnosis of SARS-CoV-2 by FDA under an Emergency Use Authorization (EUA). This EUA will remain in effect (meaning this test can be used) for the duration of the COVID-19 declaration under Section 564(b)(1) of the Act, 21 U.S.C. section 360bbb-3(b)(1), unless the authorization is terminated or revoked.  Performed at [facility], [address], [place], [place] [address]     Radiology Studies: DG Chest Port 1 View  Result Date: 09/27/2020 CLINICAL DATA:  Shortness of breath for 2 days, history coronary artery disease post MI, hypertension, type II diabetes mellitus EXAM: PORTABLE CHEST 1 VIEW COMPARISON:  Portable exam 0741 hours compared to 08/10/2020 FINDINGS: Enlargement of cardiac silhouette with pulmonary vascular congestion. Mediastinal contours normal. Mild interstitial prominence in lower lungs, question due to hypoinflation or minimal edema. No pleural effusion, segmental consolidation, or pneumothorax. Bones demineralized. IMPRESSION: Enlargement of cardiac silhouette with pulmonary vascular congestion. Interstitial prominence in the lower lungs question due to  hypoinflation or minimal pulmonary edema. Electronically Signed   By: Ulyses Southward M.D.   On: 09/27/2020 08:09    Scheduled Meds:  ALPRAZolam  0.25 mg Oral Once   apixaban  5 mg Oral BID   atorvastatin  40 mg Oral Daily   carvedilol  6.25 mg Oral BID WC   docusate sodium  100 mg Oral BID   furosemide  40 mg Intravenous BID   insulin aspart  0-15 Units Subcutaneous TID WC   pantoprazole  40 mg Oral Daily   potassium chloride  40 mEq Oral Once   sodium chloride flush  3 mL Intravenous Q12H   Continuous Infusions:   LOS: 1 day    Time spent: 35 mins    Dellis Voght, MD [facility]   If 7PM-7AM, please contact night-coverage

## 2020-09-29 ENCOUNTER — Encounter (HOSPITAL_COMMUNITY)
Admission: EM | Disposition: A | Discharge status: Home Health | Payer: Self-pay | Source: Home / Self Care | Attending: Internal Medicine

## 2020-09-29 DIAGNOSIS — E1165 Type 2 diabetes mellitus with hyperglycemia: Secondary | ICD-10-CM

## 2020-09-29 DIAGNOSIS — I5033 Acute on chronic diastolic (congestive) heart failure: Secondary | ICD-10-CM | POA: Diagnosis not present

## 2020-09-29 DIAGNOSIS — I480 Paroxysmal atrial fibrillation: Secondary | ICD-10-CM | POA: Diagnosis not present

## 2020-09-29 DIAGNOSIS — I509 Heart failure, unspecified: Secondary | ICD-10-CM

## 2020-09-29 DIAGNOSIS — I350 Nonrheumatic aortic (valve) stenosis: Secondary | ICD-10-CM | POA: Diagnosis not present

## 2020-09-29 HISTORY — PX: RIGHT/LEFT HEART CATH AND CORONARY ANGIOGRAPHY: CATH118266

## 2020-09-29 LAB — BASIC METABOLIC PANEL
Anion gap: 13 (ref 5–15)
BUN: 17 mg/dL (ref 8–23)
CO2: 31 mmol/L (ref 22–32)
Calcium: 9.6 mg/dL (ref 8.9–10.3)
Chloride: 95 mmol/L — ABNORMAL LOW (ref 98–111)
Creatinine, Ser: 0.99 mg/dL (ref 0.44–1.00)
GFR, Estimated: 57 mL/min — ABNORMAL LOW (ref >60)
Glucose, Bld: 109 mg/dL — ABNORMAL HIGH (ref 70–99)
Potassium: 3.9 mmol/L (ref 3.5–5.1)
Sodium: 139 mmol/L (ref 135–145)

## 2020-09-29 LAB — POCT I-STAT 7, (LYTES, BLD GAS, ICA,H+H)
Acid-Base Excess: 7 mmol/L — ABNORMAL HIGH (ref 0.0–2.0)
Bicarbonate: 32.3 mmol/L — ABNORMAL HIGH (ref 20.0–28.0)
Calcium, Ion: 1.15 mmol/L (ref 1.15–1.40)
HCT: 29 % — ABNORMAL LOW (ref 36.0–46.0)
Hemoglobin: 9.9 g/dL — ABNORMAL LOW (ref 12.0–15.0)
O2 Saturation: 99 %
Potassium: 3.7 mmol/L (ref 3.5–5.1)
Sodium: 141 mmol/L (ref 135–145)
TCO2: 34 mmol/L — ABNORMAL HIGH (ref 22–32)
pCO2 arterial: 51.4 mmHg — ABNORMAL HIGH (ref 32.0–48.0)
pH, Arterial: 7.407 (ref 7.350–7.450)
pO2, Arterial: 125 mmHg — ABNORMAL HIGH (ref 83.0–108.0)

## 2020-09-29 LAB — POCT I-STAT EG7
Acid-Base Excess: 7 mmol/L — ABNORMAL HIGH (ref 0.0–2.0)
Bicarbonate: 33.1 mmol/L — ABNORMAL HIGH (ref 20.0–28.0)
Calcium, Ion: 1.19 mmol/L (ref 1.15–1.40)
HCT: 31 % — ABNORMAL LOW (ref 36.0–46.0)
Hemoglobin: 10.5 g/dL — ABNORMAL LOW (ref 12.0–15.0)
O2 Saturation: 75 %
Potassium: 3.8 mmol/L (ref 3.5–5.1)
Sodium: 141 mmol/L (ref 135–145)
TCO2: 35 mmol/L — ABNORMAL HIGH (ref 22–32)
pCO2, Ven: 53.8 mmHg (ref 44.0–60.0)
pH, Ven: 7.397 (ref 7.250–7.430)
pO2, Ven: 42 mmHg (ref 32.0–45.0)

## 2020-09-29 LAB — GLUCOSE, CAPILLARY
Glucose-Capillary: 105 mg/dL — ABNORMAL HIGH (ref 70–99)
Glucose-Capillary: 132 mg/dL — ABNORMAL HIGH (ref 70–99)
Glucose-Capillary: 102 mg/dL — ABNORMAL HIGH (ref 70–99)
Glucose-Capillary: 251 mg/dL — ABNORMAL HIGH (ref 70–99)

## 2020-09-29 LAB — MAGNESIUM: Magnesium: 2.5 mg/dL — ABNORMAL HIGH (ref 1.7–2.4)

## 2020-09-29 SURGERY — RIGHT/LEFT HEART CATH AND CORONARY ANGIOGRAPHY
Anesthesia: LOCAL

## 2020-09-29 MED ORDER — ASPIRIN 81 MG PO CHEW
81.0000 mg | CHEWABLE_TABLET | Freq: Once | ORAL | Status: AC
  Administered 2020-09-29: 81 mg via ORAL
  Filled 2020-09-29: qty 1

## 2020-09-29 MED ORDER — HEPARIN SODIUM (PORCINE) 1000 UNIT/ML IJ SOLN
INTRAMUSCULAR | Status: DC | PRN
  Administered 2020-09-29: 4500 [IU] via INTRAVENOUS

## 2020-09-29 MED ORDER — SODIUM CHLORIDE 0.9 % IV SOLN
INTRAVENOUS | Status: DC
Start: 2020-09-30 — End: 2020-09-29

## 2020-09-29 MED ORDER — VERAPAMIL HCL 2.5 MG/ML IV SOLN
INTRAVENOUS | Status: DC | PRN
  Administered 2020-09-29: 10 mL via INTRA_ARTERIAL

## 2020-09-29 MED ORDER — SODIUM CHLORIDE 0.9 % IV SOLN: 250.0000 mL | INTRAVENOUS | Status: DC | PRN

## 2020-09-29 MED ORDER — HEPARIN (PORCINE) IN NACL 1000-0.9 UT/500ML-% IV SOLN
INTRAVENOUS | Status: AC
  Filled 2020-09-29: qty 1000

## 2020-09-29 MED ORDER — LABETALOL HCL 5 MG/ML IV SOLN: 10.0000 mg | INTRAVENOUS | Status: AC | PRN

## 2020-09-29 MED ORDER — SODIUM CHLORIDE 0.9% FLUSH: 3.0000 mL | INTRAVENOUS | Status: DC | PRN

## 2020-09-29 MED ORDER — SODIUM CHLORIDE 0.9% FLUSH: 3.0000 mL | Freq: Two times a day (BID) | INTRAVENOUS | Status: DC

## 2020-09-29 MED ORDER — AMIODARONE HCL IN DEXTROSE 360-4.14 MG/200ML-% IV SOLN
60.0000 mg/h | INTRAVENOUS | Status: AC
  Administered 2020-09-29 (×2): 60 mg/h via INTRAVENOUS
  Filled 2020-09-29: qty 200

## 2020-09-29 MED ORDER — MIDAZOLAM HCL 2 MG/2ML IJ SOLN
INTRAMUSCULAR | Status: DC | PRN
  Administered 2020-09-29: 1 mg via INTRAVENOUS

## 2020-09-29 MED ORDER — FENTANYL CITRATE (PF) 100 MCG/2ML IJ SOLN
INTRAMUSCULAR | Status: AC
  Filled 2020-09-29: qty 2

## 2020-09-29 MED ORDER — SODIUM CHLORIDE 0.9% FLUSH
3.0000 mL | Freq: Two times a day (BID) | INTRAVENOUS | Status: DC
  Administered 2020-09-29: 3 mL via INTRAVENOUS

## 2020-09-29 MED ORDER — FENTANYL CITRATE (PF) 100 MCG/2ML IJ SOLN
INTRAMUSCULAR | Status: DC | PRN
  Administered 2020-09-29: 25 ug via INTRAVENOUS

## 2020-09-29 MED ORDER — HEPARIN SODIUM (PORCINE) 1000 UNIT/ML IJ SOLN
INTRAMUSCULAR | Status: AC
  Filled 2020-09-29: qty 1

## 2020-09-29 MED ORDER — VERAPAMIL HCL 2.5 MG/ML IV SOLN
INTRAVENOUS | Status: AC
  Filled 2020-09-29: qty 2

## 2020-09-29 MED ORDER — SODIUM CHLORIDE 0.9 % IV SOLN: INTRAVENOUS | Status: DC

## 2020-09-29 MED ORDER — MIDAZOLAM HCL 2 MG/2ML IJ SOLN
INTRAMUSCULAR | Status: AC
  Filled 2020-09-29: qty 2

## 2020-09-29 MED ORDER — POTASSIUM CHLORIDE CRYS ER 20 MEQ PO TBCR
40.0000 meq | EXTENDED_RELEASE_TABLET | Freq: Once | ORAL | Status: AC
  Administered 2020-09-29: 40 meq via ORAL
  Filled 2020-09-29: qty 2

## 2020-09-29 MED ORDER — AMIODARONE LOAD VIA INFUSION
150.0000 mg | Freq: Once | INTRAVENOUS | Status: AC
  Administered 2020-09-29: 150 mg via INTRAVENOUS
  Filled 2020-09-29: qty 83.34

## 2020-09-29 MED ORDER — ASPIRIN EC 81 MG PO TBEC
81.0000 mg | DELAYED_RELEASE_TABLET | Freq: Every day | ORAL | Status: DC
  Administered 2020-09-30 – 2020-10-01 (×2): 81 mg via ORAL
  Filled 2020-09-29 (×2): qty 1

## 2020-09-29 MED ORDER — AMIODARONE HCL IN DEXTROSE 360-4.14 MG/200ML-% IV SOLN
30.0000 mg/h | INTRAVENOUS | Status: DC
  Filled 2020-09-29: qty 200

## 2020-09-29 MED ORDER — LIDOCAINE HCL (PF) 1 % IJ SOLN
INTRAMUSCULAR | Status: AC
  Filled 2020-09-29: qty 30

## 2020-09-29 MED ORDER — HYDRALAZINE HCL 20 MG/ML IJ SOLN: 10.0000 mg | INTRAMUSCULAR | Status: AC | PRN

## 2020-09-29 MED ORDER — IOHEXOL 350 MG/ML SOLN
INTRAVENOUS | Status: DC | PRN
  Administered 2020-09-29: 60 mL

## 2020-09-29 MED ORDER — LIDOCAINE HCL (PF) 1 % IJ SOLN
INTRAMUSCULAR | Status: DC | PRN
  Administered 2020-09-29: 4 mL

## 2020-09-29 MED ORDER — HEPARIN (PORCINE) IN NACL 1000-0.9 UT/500ML-% IV SOLN
INTRAVENOUS | Status: DC | PRN
  Administered 2020-09-29 (×2): 500 mL

## 2020-09-29 MED ORDER — APIXABAN 5 MG PO TABS
5.0000 mg | ORAL_TABLET | Freq: Two times a day (BID) | ORAL | Status: DC
  Administered 2020-09-30 – 2020-10-01 (×3): 5 mg via ORAL
  Filled 2020-09-29 (×3): qty 1

## 2020-09-29 SURGICAL SUPPLY — 12 items

## 2020-09-29 NOTE — Progress Notes (Signed)
   RN called to report patient is in atrial fibrillation with RVR after returning from the cath lab this evening. Telemetry shows atrial fibrillation with rates up to 120s-140s. Presented to bedside where patient reports some indigestion after eating a salad which is common for her, otherwise no CP, SOB, or significant palpitations. BP is stable.   Will start amiodarone gtt and monitor for response. Will plan to restart apixaban tomorrow. Dr. Excell Seltzer updated.  Beatriz Stallion, PA-C 09/29/20; 6:29 PM

## 2020-09-29 NOTE — Progress Notes (Signed)
Physical Therapy Treatment & TAVR Pre-Assessment Patient Details Name: Mary Mccarthy MRN: [id number] DOB: 16-Feb-1940 Today's Date: 09/29/2020    History of Present Illness Pt is an 80 y.o. female who presented 09/27/20 with SOB and chest tightness. Pt admitted with acute on chronic diastolic CHF, complicated by severe AS. Plan for cardiac cath 8/24. PMH includes CVA (08/2020), CAD, HTN, HLD, afib on Eliquis, obesity, DM2, anemia, MI, aortic stenosis, osteoporosis.    PT Comments    Pt is a 80 y.o.female being assessed for pre-TAVR.  Pt reports symptoms of shortness of breath and instability with activity.  Pt has [facility] strength/balance, and fair balance.  Pt ambulated 583 ft during the 6 minute walk test requiring 2x standing rest breaks with max HR of 103, lowest O2 sat 92% on RA during mobility.  5 meter walk test produced an average gait speed of 2.0 ft/sec.  Self-reported RPE was 0/10 and dyspnea was 4/10 during mobility.  Pt was limited by c/o shortness of breath and feeling off balance with ambulation ("I feel like I'm falling forward").  Pt's frailty rating was 5 which is considered "mildly frail."  Pt would benefit from continued PT in the acute care setting due to the above listed deficits in balance, strength, ROM, endurance and activity tolerance.   General UE/LE Strength and ROM:  Strength (0-5/5) ROM (limited/full)  R UE WFL WFL  L UE WFL WFL  R LE WFL WFL  L LE WFL WFL    6 Minute Walk Test:   Total Distance Walked:583 ft.    Did the pt need a rest break? Yes If yes, why? Pain:No; Fatigue:No; Dyspnea/O2 saturations: Yes   Pre-Test Post-Test  BP 138/53 146/55  HR 71 86  O2 saturations  92% on RA 95% on RA  Modified Borg Dyspnea Scale (0 none-10 maximal) 0/10 0/10  RPE (6 very light-10 very hard) 0/10 4/10    5 Meter Walk Test:  Trial 1 9.06 seconds  Trial 2 8.05 seconds  Trial 3 7.27 seconds  3 Trial Average/Gait Speed 8.13 sec --> 2.0 ft/sec (<1.8 ft/sec indicates  high fall risk)    Clinical Frailty Scale (1 very fit - 9 terminally ill): 5 (>/= 3/9 is considered frail)   Follow Up Recommendations  Home health PT;Supervision for mobility/OOB     Equipment Recommendations  None recommended by PT    Recommendations for Other Services       Precautions / Restrictions Precautions Precautions: Fall;Other (comment) Precaution Comments: Recent CVA (08/2020) with residual L-side weakness Restrictions Weight Bearing Restrictions: No    Mobility  Bed Mobility Overal bed mobility: Modified Independent             General bed mobility comments: Received sitting in recliner    Transfers Overall transfer level: Needs assistance Equipment used: None Transfers: Sit to/from Stand Sit to Stand: Supervision         General transfer comment: Min guard for safety, no LOB, but increased time to power up to stand.  Ambulation/Gait Ambulation/Gait assistance: Min guard Gait Distance (Feet): 583 Feet Assistive device: None Gait Pattern/deviations: Step-through pattern;Decreased stride length;Shuffle;Trunk flexed Gait velocity: 2.0 ft/sec Gait velocity interpretation: 1.31 - 2.62 ft/sec, indicative of limited community ambulator General Gait Details: Fast, mildly unsteady gait without DME; pt with forward flexed posture and forward momentum that she has difficulty controlling; intermittent standing rest breaks secondary to c/o SOB and "needing to catch my balance" with ambulation; cues for upright posture and pt able to  correct, but returns to initial forward stance when distracted   Stairs             Wheelchair Mobility    Modified Rankin (Stroke Patients Only)       Balance Overall balance assessment: Needs assistance Sitting-balance support: No upper extremity supported;Feet supported Sitting balance-[name]: Good Sitting balance - Comments: supervision during LB ADLs seated   Standing balance support: Bilateral upper  extremity supported;No upper extremity supported;During functional activity Standing balance-[name]: Fair Standing balance comment: min guard dynamically during ADLs and BUE support during mobility                            Cognition Arousal/Alertness: Awake/alert Behavior During Therapy: WFL for tasks assessed/performed Overall Cognitive Status: Within Functional Limits for tasks assessed                                        Exercises      General Comments General comments (skin integrity, edema, etc.): Completed pre-TAVR assessment this session. VSS on RA      Pertinent Vitals/Pain Pain Assessment: No/denies pain Pain Intervention(s): Monitored during session    Home Living Family/patient expects to be discharged to:: Private residence Living Arrangements: Children (son) Available Help at Discharge: Family;Available 24 hours/day Type of Home: House Home Access: Stairs to enter Entrance Stairs-Rails: Can reach both Home Layout: One level Home Equipment: Walker - 2 wheels;Cane - single point;Walker - 4 wheels;Cane - quad;Bedside commode;Shower seat - built in Additional Comments: son works    Prior Function Level of Independence: Independent with assistive device(s)      Comments: hx of recent CVAs still affecting L side slightly. Following recent CVAs, recently progressed away from rollator to only using quad-cane for mobility in the community. No longer driving. Prior to CVAs, was independent and driving.   PT Goals (current goals can now be found in the care plan section) Acute Rehab PT Goals Patient Stated Goal: to get home Progress towards PT goals: Progressing toward goals    Frequency    Min 3X/week      PT Plan Current plan remains appropriate    Co-evaluation              AM-PAC PT "6 Clicks" Mobility   Outcome Measure  Help needed turning from your back to your side while in a flat bed without using  bedrails?: None Help needed moving from lying on your back to sitting on the side of a flat bed without using bedrails?: None Help needed moving to and from a bed to a chair (including a wheelchair)?: A Little Help needed standing up from a chair using your arms (e.g., wheelchair or bedside chair)?: A Little Help needed to walk in hospital room?: A Little Help needed climbing 3-5 steps with a railing? : A Little 6 Click Score: 20    End of Session Equipment Utilized During Treatment: Gait belt Activity Tolerance: Patient tolerated treatment well Patient left: in chair;with call bell/phone within reach;with chair alarm set Nurse Communication: Mobility status PT Visit Diagnosis: Unsteadiness on feet (R26.81);Other abnormalities of gait and mobility (R26.89);Muscle weakness (generalized) (M62.81);Difficulty in walking, not elsewhere classified (R26.2)     Time: 6378-5885 PT Time Calculation (min) (ACUTE ONLY): 25 min  Charges:  $Gait Training: 8-22 mins $Therapeutic Exercise: 8-22 mins  Ina Homes, PT, DPT Acute Rehabilitation Services  Pager [phone] Office [phone]  Malachy Chamber 09/29/2020, 12:58 PM

## 2020-09-29 NOTE — Progress Notes (Addendum)
[facility] PROGRESS NOTE    Progress Note  Alyvia Derk  WLS:[id number] DOB: 24-Jul-1940 DOA: 09/27/2020 PCP: Corwin Levins, MD     Brief Narrative:   Mary Mccarthy is an 80 y.o. female past medical history significant for CAD, essential hypertension atrial fibrillation on Eliquis morbid obesity comes into the ED complaining of chest tightness SARS-CoV-2 PCR is negative.  She was treated empirically with doxycycline by her PCP she felt volume overloaded develop a cough 2D echo was done that showed severe aortic stenosis on her previous hospitalization. Cardiology was consulted she was admitted for heart failure exacerbation started on IV diuresis.    Assessment/Plan:    Acute on chronic diastolic CHF (congestive heart failure) (HCC) With a history of nonischemic recent 2D echo showed an EF of 60% she responded well to IV Lasix. Continue Coreg p.o. twice daily monitor electrolytes and replete as needed. She still requiring 1 L of oxygen to keep saturations greater than 90%. Have asked the nurse to place her flat to see if she will tolerate laying flat for cardiac cath. Continue strict I's and O's and daily weights. Cardiology recommended her vital left heart cath on 09/29/2020.  Severe aortic stenosis: Cardiology was consulted pre-TAVR CT while she is admitted. Right and left heart cath today.  Essential hypertension: Continue Coreg Avapro and diuretic.  Chronic atrial fibrillation: Rate controlled Coreg and Eliquis.  Morbid obesity: Outpatient follow-up.  History of CVA: Continue Eliquis.  Hypokalemia: Replete orally recheck in the morning.     DVT prophylaxis: eliquis Family Communication:none Status is: Inpatient  Remains inpatient appropriate because:Hemodynamically unstable  Dispo: The patient is from: Home              Anticipated d/c is to: Home              Patient currently is not medically stable to d/c.   Difficult to place patient  No        Code Status:     Code Status Orders  (From admission, onward)           Start     Ordered   09/27/20 1306  Full code  Continuous        09/27/20 1306           Code Status History     Date Active Date Inactive Code Status Order ID Comments User Context   08/10/2020 0651 08/12/2020 2030 Full Code [id number]  Marinda Elk, MD ED   05/18/2015 0020 05/23/2015 1903 Full Code [id number]  [name], MD ED         IV Access:   Peripheral IV   Procedures and diagnostic studies:   CT CORONARY MORPH W/CTA COR W/SCORE W/CA W/CM &/OR WO/CM  Result Date: 09/29/2020 EXAM: OVER-READ INTERPRETATION  CT CHEST The following report is an over-read performed by radiologist Dr. Trudie Reed of [facility] on 09/29/2020. This over-read does not include interpretation of cardiac or coronary anatomy or pathology. The coronary calcium score/coronary CTA interpretation by the cardiologist is attached. COMPARISON:  Cardiac CTA 10/09/2019. FINDINGS: Extracardiac findings will be described separately under dictation for contemporaneously obtained CTA chest, abdomen and pelvis. IMPRESSION: Please see separate dictation for contemporaneously obtained CTA chest, abdomen and pelvis dated 09/28/2020 for full description of relevant extracardiac findings. Electronically Signed   By: Trudie Reed M.D.   On: 09/29/2020 06:07     Medical Consultants:   None.   Subjective:    Lesli Albee  Grein relates her shortness of breath is better.  Objective:    Vitals:   09/28/20 2306 09/29/20 0015 09/29/20 0400 09/29/20 0838  BP: (!) 171/86 (!) 164/66 (!) 145/47 (!) 134/47  Pulse: 69 61 68 75  Resp:  20 18   Temp:  98.2 F (36.8 C) 98.8 F (37.1 C)   TempSrc:  Oral Oral   SpO2:  99% 95% 96%  Weight:   90.1 kg   Height:       SpO2: 96 % O2 Flow Rate (L/min): 1 L/min   Intake/Output Summary (Last 24 hours) at 09/29/2020 0958 Last data filed at 09/29/2020  0900 Gross per 24 hour  Intake 943 ml  Output 1950 ml  Net -1007 ml   Filed Weights   09/27/20 0644 09/28/20 0401 09/29/20 0400  Weight: 103 kg 91.2 kg 90.1 kg    Exam: General exam: In no acute distress. Respiratory system: Good air movement and clear to auscultation. Cardiovascular system: S1 & S2 heard, RRR. No JVD. Gastrointestinal system: Abdomen is nondistended, soft and nontender.  Extremities: No pedal edema. Skin: No rashes, lesions or ulcers Psychiatry: Judgement and insight appear normal. Mood & affect appropriate.    Data Reviewed:    Labs: Basic Metabolic Panel: Recent Labs  Lab 09/27/20 0705 09/27/20 0739 09/28/20 0309 09/29/20 0440  NA 140 143 138 139  K 3.4* 3.5 3.3* 3.9  CL 105 105 98 95*  CO2 25  --  30 31  GLUCOSE 109* 106* 127* 109*  BUN 15 18 15 17   CREATININE 0.80 0.70 0.90 0.99  CALCIUM 9.2  --  8.9 9.6  MG  --   --  0.8* 2.5*   GFR Estimated Creatinine Clearance: 45.5 mL/min (by C-G formula based on SCr of 0.99 mg/dL). Liver Function Tests: Recent Labs  Lab 09/27/20 0705  AST 21  ALT 16  ALKPHOS 46  BILITOT 1.7*  PROT 6.8  ALBUMIN 3.3*   No results for input(s): LIPASE, AMYLASE in the last 168 hours. No results for input(s): AMMONIA in the last 168 hours. Coagulation profile No results for input(s): INR, PROTIME in the last 168 hours. COVID-19 Labs  No results for input(s): DDIMER, FERRITIN, LDH, CRP in the last 72 hours.  Lab Results  Component Value Date   SARSCOV2NAA NEGATIVE 09/27/2020   SARSCOV2NAA NEGATIVE 08/10/2020    CBC: Recent Labs  Lab 09/27/20 0705 09/27/20 0739 09/28/20 0309  WBC 6.7  --  7.1  NEUTROABS 4.5  --   --   HGB 11.5* 11.2* 11.1*  HCT 34.6* 33.0* 32.7*  MCV 96.9  --  95.9  PLT 270  --  254   Cardiac Enzymes: No results for input(s): CKTOTAL, CKMB, CKMBINDEX, TROPONINI in the last 168 hours. BNP (last 3 results) No results for input(s): PROBNP in the last 8760 hours. CBG: Recent Labs   Lab 09/28/20 0850 09/28/20 1055 09/28/20 1603 09/28/20 2111 09/29/20 0612  GLUCAP 204* 152* 120* 197* 105*   D-Dimer: No results for input(s): DDIMER in the last 72 hours. Hgb A1c: No results for input(s): HGBA1C in the last 72 hours. Lipid Profile: No results for input(s): CHOL, HDL, LDLCALC, TRIG, CHOLHDL, LDLDIRECT in the last 72 hours. Thyroid function studies: No results for input(s): TSH, T4TOTAL, T3FREE, THYROIDAB in the last 72 hours.  Invalid input(s): FREET3 Anemia work up: No results for input(s): VITAMINB12, FOLATE, FERRITIN, TIBC, IRON, RETICCTPCT in the last 72 hours. Sepsis Labs: Recent Labs  Lab 09/27/20 [phone] 09/28/20  0309  WBC 6.7 7.1   Microbiology Recent Results (from the past 240 hour(s))  Resp Panel by RT-PCR (Flu A&B, Covid) Nasopharyngeal Swab     Status: None   Collection Time: 09/27/20  6:40 AM   Specimen: Nasopharyngeal Swab; Nasopharyngeal(NP) swabs in vial transport medium  Result Value Ref Range Status   SARS Coronavirus 2 by RT PCR NEGATIVE NEGATIVE Final    Comment: (NOTE) SARS-CoV-2 target nucleic acids are NOT DETECTED.  The SARS-CoV-2 RNA is generally detectable in upper respiratory specimens during the acute phase of infection. The lowest concentration of SARS-CoV-2 viral copies this assay can detect is 138 copies/mL. A negative result does not preclude SARS-Cov-2 infection and should not be used as the sole basis for treatment or other patient management decisions. A negative result may occur with  improper specimen collection/handling, submission of specimen other than nasopharyngeal swab, presence of viral mutation(s) within the areas targeted by this assay, and inadequate number of viral copies(<138 copies/mL). A negative result must be combined with clinical observations, patient history, and epidemiological information. The expected result is Negative.  Fact Sheet for Patients:   BloggerCourse.com  Fact Sheet for Healthcare Providers:  SeriousBroker.it  This test is no t yet approved or cleared by the [place] FDA and  has been authorized for detection and/or diagnosis of SARS-CoV-2 by FDA under an Emergency Use Authorization (EUA). This EUA will remain  in effect (meaning this test can be used) for the duration of the COVID-19 declaration under Section 564(b)(1) of the Act, 21 U.S.C.section 360bbb-3(b)(1), unless the authorization is terminated  or revoked sooner.       Influenza A by PCR NEGATIVE NEGATIVE Final   Influenza B by PCR NEGATIVE NEGATIVE Final    Comment: (NOTE) The Xpert Xpress SARS-CoV-2/FLU/RSV plus assay is intended as an aid in the diagnosis of influenza from Nasopharyngeal swab specimens and should not be used as a sole basis for treatment. Nasal washings and aspirates are unacceptable for Xpert Xpress SARS-CoV-2/FLU/RSV testing.  Fact Sheet for Patients: BloggerCourse.com  Fact Sheet for Healthcare Providers: SeriousBroker.it  This test is not yet approved or cleared by the [place] FDA and has been authorized for detection and/or diagnosis of SARS-CoV-2 by FDA under an Emergency Use Authorization (EUA). This EUA will remain in effect (meaning this test can be used) for the duration of the COVID-19 declaration under Section 564(b)(1) of the Act, 21 U.S.C. section 360bbb-3(b)(1), unless the authorization is terminated or revoked.  Performed at [facility], [address], [place], [place] [address]      Medications:    ALPRAZolam  0.25 mg Oral Once   atorvastatin  40 mg Oral Daily   carvedilol  6.25 mg Oral BID WC   docusate sodium  100 mg Oral BID   furosemide  40 mg Intravenous BID   insulin aspart  0-15 Units Subcutaneous TID WC   irbesartan  300 mg Oral Daily   pantoprazole  40 mg Oral Daily    potassium chloride  40 mEq Oral Once   sodium chloride flush  3 mL Intravenous Q12H   Continuous Infusions:    LOS: 2 days   Marinda Elk  [facility]  09/29/2020, 9:58 AM

## 2020-09-29 NOTE — H&P (View-Only) (Signed)
Progress Note  Patient Name: Mary Mccarthy Date of Encounter: 09/29/2020  Primary Cardiologist: Bryan Lemma, MD   Subjective   Ms. Hoerner is sitting up in the chair resting comfortably.  States that her breathing has significantly improved and that she feels much less anxious.  She was able to lie flat and on 2 pillows yesterday in the evening and was able to lie down with 1 pillow during her CT scans yesterday.  She denies episodes of chest pain or shortness of breath with moving around.  She continues to urinate frequently.  She has no acute concerns at this time, we discussed that when she is able to lie flat we will plan for a cardiac catheterization.  We discussed this procedure again as well as the risks and benefits. Inpatient Medications    Scheduled Meds:  ALPRAZolam  0.25 mg Oral Once   atorvastatin  40 mg Oral Daily   carvedilol  6.25 mg Oral BID WC   docusate sodium  100 mg Oral BID   furosemide  40 mg Intravenous BID   insulin aspart  0-15 Units Subcutaneous TID WC   irbesartan  300 mg Oral Daily   pantoprazole  40 mg Oral Daily   potassium chloride  40 mEq Oral Once   sodium chloride flush  3 mL Intravenous Q12H   Continuous Infusions:  PRN Meds: acetaminophen **OR** acetaminophen, benzonatate, bisacodyl, hydrALAZINE, morphine injection, ondansetron **OR** ondansetron (ZOFRAN) IV, oxyCODONE, polyethylene glycol, traZODone   Vital Signs    Vitals:   09/28/20 2100 09/28/20 2306 09/29/20 0015 09/29/20 0400  BP:  (!) 171/86 (!) 164/66 (!) 145/47  Pulse:  69 61 68  Resp:   20 18  Temp:   98.2 F (36.8 C) 98.8 F (37.1 C)  TempSrc:   Oral Oral  SpO2: 96%  99% 95%  Weight:    90.1 kg  Height:        Intake/Output Summary (Last 24 hours) at 09/29/2020 0812 Last data filed at 09/29/2020 6203 Gross per 24 hour  Intake 703 ml  Output 2225 ml  Net -1522 ml    Filed Weights   09/27/20 0644 09/28/20 0401 09/29/20 0400  Weight: 103 kg 91.2 kg 90.1 kg     Telemetry    Normal sinus rhythm, ST wave changes in leads II and V1, repeat EKG pending- Personally Reviewed  ECG   NSR rate of 65 bpm with PACs present- Personally Reviewed  Physical Exam   GEN: No acute distress, resting comfortably Neck: No JVD appreciated Cardiac: RRR, 3/6 crescendo decrescendo systolic murmur best heard over right second intercostal space Respiratory: Saturating well on 1 L nasal cannula, no respiratory distress.  Decreased breath sounds at the bases bilaterally. GI: Soft, nontender, non-distended  MS: Trace lower extremity edema. Neuro:  Nonfocal  Psych: Normal mood and thought process  Labs    Chemistry Recent Labs  Lab 09/27/20 0705 09/27/20 0739 09/28/20 0309 09/29/20 0440  NA 140 143 138 139  K 3.4* 3.5 3.3* 3.9  CL 105 105 98 95*  CO2 25  --  30 31  GLUCOSE 109* 106* 127* 109*  BUN 15 18 15 17   CREATININE 0.80 0.70 0.90 0.99  CALCIUM 9.2  --  8.9 9.6  PROT 6.8  --   --   --   ALBUMIN 3.3*  --   --   --   AST 21  --   --   --   ALT 16  --   --   --  ALKPHOS 46  --   --   --   BILITOT 1.7*  --   --   --   GFRNONAA >60  --  >60 57*  ANIONGAP 10  --  10 13      Hematology Recent Labs  Lab 09/27/20 0705 09/27/20 0739 09/28/20 0309  WBC 6.7  --  7.1  RBC 3.57*  --  3.41*  HGB 11.5* 11.2* 11.1*  HCT 34.6* 33.0* 32.7*  MCV 96.9  --  95.9  MCH 32.2  --  32.6  MCHC 33.2  --  33.9  RDW 13.4  --  13.5  PLT 270  --  254     Cardiac EnzymesNo results for input(s): TROPONINI in the last 168 hours. No results for input(s): TROPIPOC in the last 168 hours.   BNP Recent Labs  Lab 09/27/20 0705  BNP 398.1*      DDimer No results for input(s): DDIMER in the last 168 hours.   Radiology    CT CORONARY MORPH W/CTA COR W/SCORE W/CA W/CM &/OR WO/CM  Result Date: 09/29/2020 EXAM: OVER-READ INTERPRETATION  CT CHEST The following report is an over-read performed by radiologist Dr. Trudie Reed of [facility] on  09/29/2020. This over-read does not include interpretation of cardiac or coronary anatomy or pathology. The coronary calcium score/coronary CTA interpretation by the cardiologist is attached. COMPARISON:  Cardiac CTA 10/09/2019. FINDINGS: Extracardiac findings will be described separately under dictation for contemporaneously obtained CTA chest, abdomen and pelvis. IMPRESSION: Please see separate dictation for contemporaneously obtained CTA chest, abdomen and pelvis dated 09/28/2020 for full description of relevant extracardiac findings. Electronically Signed   By: Trudie Reed M.D.   On: 09/29/2020 06:07    Cardiac Studies   CTA Chest-2022 Cardiovascular Findings: Heart size is normal. There is no significant pericardial fluid, thickening or pericardial calcification. There is aortic atherosclerosis, as well as atherosclerosis of the great vessels of the mediastinum and the coronary arteries, including calcified atherosclerotic plaque in the left main, left anterior descending and right coronary arteries. Severe thickening calcification of the aortic valve. Severe calcifications of the mitral annulus.    TTE 07/22 1. The aortic valve is calcified. Aortic valve regurgitation is not  visualized. Severe aortic valve stenosis. Aortic valve area, by VTI  measures 0.86 cm. Aortic valve mean gradient measures 42.0 mmHg. Aortic  valve Vmax measures 4.36 m/s.   2. Left ventricular ejection fraction, by estimation, is 55 to 60%. The  left ventricle has normal function. The left ventricle has no regional  wall motion abnormalities. There is severe concentric left ventricular  hypertrophy. Left ventricular diastolic   parameters are indeterminate.   3. Right ventricular systolic function is normal. The right ventricular  size is normal. There is moderately elevated pulmonary artery systolic  pressure.   4. Left atrial size was mildly dilated.   5. The pericardial effusion is circumferential.    6. The mitral valve is abnormal. Trivial mitral valve regurgitation. The  mean mitral valve gradient is 2.0 mmHg with average heart rate of 53 bpm.  Severe mitral annular calcification.   TTE 10/01/2019:  EF 65 to 70%.  Normal RWMA, G1 DD-elevated LAP.  Mildly elevated PAP.  Moderate LA dilation.  Moderate aortic valve stenosis.  (Mean gradient 34.5). ->  Probably still stable.  2019 mean gradient was 36, then reduced to 31 this February 21 -> now 34.5.  Coronary Calcium Score-CTA 10/09/2019: Calcium score 624.  82nd percentile. Dominant RCA: Mild (  25-49%) proximal stenosis-distal bifurcation into PDA and PAV--< RPL branches.  LAD (1 major mid vessel diagonal) diffuse calcified plaque, mild proximal stenosis with minimal distal stenosis.  Small RI moderate ostial disease.  LCx-moderate mixed (50-69%) proximal stenosis.  Small distal OM1 disease.  [place]leaflet aortic valve annular calcification of probable stenosis.   LHC 2017 Ost Ramus lesion, 60% stenosed. Ost RCA lesion, 30% stenosed. Dist LAD lesion, tapers to a small vessel with diffuse roughly 40-50% stenosis. The left ventricular systolic function is normal. Elevated LVEDP  Patient Profile     80 y.o. female hx of moderate-severe aortic stenosis, HFpEF, non obstructive CAD, atrial fibrillation on eliquis, CVA, HTN, pulmonary HTN who is being seen today for the evaluation of acute heart failure exacerbation at the request of [facility].  Assessment & Plan    Acute on Chronic Diastolic Heart Failure Exacerbation History of non-ischemic HFpEF, recent echo 07/22 EF 55-60%. Responding to IV lasix 40 mg BID. 24 hr I/O of -1. mL, cumulative I/O - . Weight down 1 kg from yesterday.  Blood pressure has improved as well as her oxygen supplementation needs.  JVD and lower extremity swelling improving. Cr stable and electorlytes have improved.able to lie flat with 1-2 pillows yesterday without feeling short of breath.  CTA chest  yesterday revealed trace bilateral pleural effusions right greater than left.  Plan for heart catheterization today. -continue IV lasix 40 mg BID -continue carvedilol 6.25 BID, no signs of hypoperfusion on exam -Mg stabilized to 2.5. K of 3.9. Continue daily bmp,mag  -strict I/O and daily weights -daily weights/strict I/O's -work with PT and ambulate w/wo O2 to determine oxygen needs  Hypertensive Urgency Restarted irbesartan yesterday, BP improving. Cr stable.  -continue coreg 6.25 BID, irbesartan 300 mg daily.  -continue diuretic therapy as per above -Continue to monitor renal function   Aortic Stenosis Hx of moderate-severe aortic stenosis. Last echocardiogram 7/22 with aortic valve features of peak velocity 4.36 m/s, mean gradient 42 mm Hg, AV area 0.86 cm, dimensionless index 0.27. Planning for aortic valve replacement, pre-TAVR workup during this admission.  Plan for cardiac catheterization tomorrow.  Patient with CT coronary as well as CT angio chest and abdomen pelvis yesterday.  CT results with aortic atherosclerosis, as well as atherosclerosis of the coronary arteries; left main, left anterior descending and right coronary is.  She also has severe thickening of the aortic valve and severe calcifications of mitral annulus. -Plan for cardiac catheterization today -Patient made NPO, last dose of eliquis was yesterday evening. Holding at this time -PT evaluated patient and recommending home health PT as well as supervision.   Atrial Fibrillation NSR on telemetry, no incidence of arrhythmia on telemetry.  -continue cardiac monitoring -hold xarelto in anticipation of her cardiac cath today, carvedilol 6.25 mg BID    Hx of non-occlusive CAD LHC in 2017, Ost ramus lesion 60% stenosed, Ost RCA lesion 30% stenosed, distal LAD tapers to small vessel with diffuse roughly 40-50% stenosis.  Evidence of atherosclerosis on CTA.  Planning for heart catheterization today. Did have some ST changes  on telemtry. Repeat EKG pending. She has remained chest pain free -continue atorvastatin 40 mg daily and DM management -CT coronary results as per above -L&RHC today   Hx of CVA Patient admitted 07/22 with shower of strokes thought to be embolic in etiology 2/2 atrial fibrillation. She was started on eliquis and has been adherent to therapy.  -hold eliquis for L&RHC -continue statin therapy and DM management  HLD Continue atorvastatin 40 mg daily. Last lipid panel 07/22, total C of 97 and LDL of 48.    T2DM Last A1c of 7.4 07/22, medication regimen of metformin and glimepiride.  -SSI, management per primary team  Hx of Anxiety Disorder Per recent documentation PCP discontinued her benzodiazepine recently. Improvement of anxiety overnight, patient states she feels much better.  -management per primary team   Incidental Pulmonary Nodules  Patient found to have 5 mm pulmonary nodule on lateral segment of right middle lobe. Can consider repeat imaging in 1 year.   For questions or updates, please contact [facility] Please consult [email or web address] for contact info under Cardiology/STEMI.   Signed, Thalia Bloodgood DO  Internal Medicine Resident PGY-2 [place]  Pager: [phone]  Patient seen, examined. Available data reviewed. Agree with findings, assessment, and plan as outlined by Dr Evie Lacks.  The patient is comfortable.  She is in no acute distress.  JVP is normal, lungs are clear bilaterally, heart is regular rate and rhythm with 3/6 harsh crescendo decrescendo murmur at the right upper sternal border, abdomen is soft and nontender, extremities have trace edema.  The patient is clinically improved with IV diuresis and current medical therapy.  She underwent CT angiography studies of the heart and the chest, abdomen, and pelvis yesterday.  She appears to have transfemoral access for TAVR.  The gated cardiac scan interpretation is still pending.  I think she is stable to  undergo right and left heart catheterization today in preparation for TAVR. I have reviewed the risks, indications, and alternatives to cardiac catheterization, possible angioplasty, and stenting with the patient. Risks include but are not limited to bleeding, infection, vascular injury, stroke, myocardial infection, arrhythmia, kidney injury, radiation-related injury in the case of prolonged fluoroscopy use, emergency cardiac surgery, and death. The patient understands the risks of serious complication is 1-2 in 1000 with diagnostic cardiac cath and 1-2% or less with angioplasty/stenting.  Once her cardiac catheterization is completed, we do not anticipate further inpatient work-up for TAVR.  We will arrange an outpatient cardiac surgical consultation with Dr. Laneta Simmers.  Further plans/disposition pending her cardiac catheterization results today.  Her last dose of apixaban was last night so she will be scheduled for cardiac catheterization late in the day with anticipated right radial approach.  Tonny Bollman, M.D. 09/29/2020 11:26 AM

## 2020-09-29 NOTE — Consult Note (Signed)
   [facility] Inpatient Consult   09/29/2020  Jeanette Rauth Nov 09, 1940 [id number]  [facility] [[facility]]  Accountable Care Organization [ACO] Patient:  Patient was screened for [facility] [[facility]]  Care Management services. Patient will have the transition of care call conducted by the primary care provider. This patient is also in an Embedded practice which has a chronic disease management Embedded Care Management team.  Plan: Notification to be sent to the [facility] Embedded Care Management and made aware of above needs.   Please contact for further questions,  Charlesetta Shanks, RN BSN CCM [facility]  [phone] business mobile phone Toll free office [phone]  Fax number: [phone] [email or web address]

## 2020-09-29 NOTE — Evaluation (Signed)
Occupational Therapy Evaluation Patient Details Name: Mary Mccarthy MRN: [id number] DOB: [birth year] Today's Date: 09/29/2020    History of Present Illness Pt is an 80 y.o. female who presented 09/27/20 with SOB and chest tightness. Pt admitted with acute on chronic diastolic CHF, complicated by severe AS. PMH: CVA 7/22, CAD, HTN, HLD, afib on Eliquis, obesity, DM2, anemia, MI, aortic stenosis, osteoporosis   Clinical Impression   PTA patient independent with ADLs, mobility using quad cane. Admitted for above and limited by problem list below, including impaired balance, decreased activity tolerance and generalized weakness.  She completes transfers and functional mobility using RW with min guard, ADLs with up to min assist. Able to maintain SPO2 on RA during session with PLB, but noted desaturation to 88% after activity therefore donned 1L at rest in recliner. Believe she will benefit from continued OT services while admitted and after dc at [facility] level to optimize independence and safety with ADLs.      Follow Up Recommendations  Home health OT;Supervision - Intermittent (may progress to no OT followup)    Equipment Recommendations  None recommended by OT    Recommendations for Other Services       Precautions / Restrictions Precautions Precautions: Fall Precaution Comments: monitor SpO2 Restrictions Weight Bearing Restrictions: No      Mobility Bed Mobility Overal bed mobility: Modified Independent             General bed mobility comments: HOB elevated but no assist required    Transfers Overall transfer level: Needs assistance Equipment used: Rolling walker (2 wheeled) Transfers: Sit to/from Stand Sit to Stand: Min guard         General transfer comment: Min guard for safety, no LOB, but increased time to power up to stand.    Balance Overall balance assessment: Needs assistance Sitting-balance support: No upper extremity supported;Feet supported Sitting  balance-[name]: Good Sitting balance - Comments: supervision during LB ADLs seated   Standing balance support: Bilateral upper extremity supported;No upper extremity supported;During functional activity Standing balance-[name]: Fair Standing balance comment: min guard dynamically during ADLs and BUE support during mobility                           ADL either performed or assessed with clinical judgement   ADL Overall ADL's : Needs assistance/impaired     Grooming: Supervision/safety;Standing           Upper Body Dressing : Set up;Sitting   Lower Body Dressing: Min guard;Sit to/from stand Lower Body Dressing Details (indicate cue type and reason): able to don socks with increased time, min guard standing Toilet Transfer: Min guard;Ambulation;RW;Regular Toilet   Toileting- Clothing Manipulation and Hygiene: Minimal assistance;Sit to/from stand Toileting - Clothing Manipulation Details (indicate cue type and reason): for thoroughness     Functional mobility during ADLs: Min guard;Rolling walker General ADL Comments: pt limited by decreased activity tolerance and weakness     Vision   Vision Assessment?: No apparent visual deficits     Perception     Praxis      Pertinent Vitals/Pain Pain Assessment: No/denies pain     Hand Dominance Right   Extremity/Trunk Assessment Upper Extremity Assessment Upper Extremity Assessment: Overall WFL for tasks assessed   Lower Extremity Assessment Lower Extremity Assessment: Defer to PT evaluation       Communication Communication Communication: No difficulties   Cognition Arousal/Alertness: Awake/alert Behavior During Therapy: WFL for tasks assessed/performed Overall Cognitive  Status: Within Functional Limits for tasks assessed                                     General Comments  pt on 1L supplemental O2 inially, weaned to RA during session with SpO2 90-91% with good waveform;  redonned 1L at rest due to desaturation to 88% and rebounded to 94%    Exercises     Shoulder Instructions      Home Living Family/patient expects to be discharged to:: Private residence Living Arrangements: Children (son) Available Help at Discharge: Family;Available 24 hours/day Type of Home: House Home Access: Stairs to enter [employer] of Steps: 1 Entrance Stairs-Rails: Can reach both Home Layout: One level     Bathroom Shower/Tub: Producer, television/film/video: Handicapped height     Home Equipment: Environmental consultant - 2 wheels;Cane - single point;Walker - 4 wheels;Cane - quad;Bedside commode;Shower seat - built in   Additional Comments: son works      Prior Functioning/Environment Level of Independence: Independent with assistive device(s)        Comments: hx of recent CVAs still affecting L side slightly. Following recent CVAs, recently progressed away from rollator to only using quad-cane for mobility in the community. No longer driving. Prior to CVAs, was independent and driving.        OT Problem List: Decreased strength;Impaired balance (sitting and/or standing);Decreased activity tolerance;Decreased knowledge of precautions;Cardiopulmonary status limiting activity;Obesity      OT Treatment/Interventions: Self-care/ADL training;DME and/or AE instruction;Therapeutic activities;Patient/family education;Balance training;Therapeutic exercise;Energy conservation    OT Goals(Current goals can be found in the care plan section) Acute Rehab OT Goals Patient Stated Goal: to get home OT Goal Formulation: With patient Time For Goal Achievement: 10/13/20 Potential to Achieve Goals: Good  OT Frequency: Min 2X/week   Barriers to D/C:            Co-evaluation              AM-PAC OT "6 Clicks" Daily Activity     Outcome Measure Help from another person eating meals?: None Help from another person taking care of personal grooming?: A Little Help from  another person toileting, which includes using toliet, bedpan, or urinal?: A Little Help from another person bathing (including washing, rinsing, drying)?: A Little Help from another person to put on and taking off regular upper body clothing?: A Little Help from another person to put on and taking off regular lower body clothing?: A Little 6 Click Score: 19   End of Session Equipment Utilized During Treatment: Rolling walker Nurse Communication: Mobility status;Other (comment) (needing purewick)  Activity Tolerance: Patient tolerated treatment well Patient left: in chair;with call bell/phone within reach;with chair alarm set  OT Visit Diagnosis: Other abnormalities of gait and mobility (R26.89);Muscle weakness (generalized) (M62.81)                Time: 1610-9604 OT Time Calculation (min): 19 min Charges:  OT General Charges $OT Visit: 1 Visit OT Evaluation $OT Eval Moderate Complexity: 1 Mod  Barry Brunner, OT Acute Rehabilitation Services Pager [phone] Office [phone]   Mary Mccarthy 09/29/2020, 10:15 AM

## 2020-09-29 NOTE — Plan of Care (Signed)
  Problem: Education: Goal: Knowledge of General Education information will improve Description: Including pain rating scale, medication(s)/side effects and non-pharmacologic comfort measures Outcome: Progressing   Problem: Clinical Measurements: Goal: Respiratory complications will improve Outcome: Progressing   Problem: Clinical Measurements: Goal: Cardiovascular complication will be avoided Outcome: Progressing   Problem: Activity: Goal: Risk for activity intolerance will decrease Outcome: Progressing   

## 2020-09-29 NOTE — Progress Notes (Signed)
CCMD notified RN that the patient converted into A-Fib at 1728. Patient is asymptomatic. HR is in the 100-120s. Cardiology paged to notify.

## 2020-09-29 NOTE — Interval H&P Note (Signed)
History and Physical Interval Note:  09/29/2020 3:30 PM  Mary Mccarthy  has presented today for surgery, with the diagnosis of aortic stenosis.  The various methods of treatment have been discussed with the patient and family. After consideration of risks, benefits and other options for treatment, the patient has consented to  Procedure(s): RIGHT/LEFT HEART CATH AND CORONARY ANGIOGRAPHY (N/A) as a surgical intervention.  The patient's history has been reviewed, patient examined, no change in status, stable for surgery.  I have reviewed the patient's chart and labs.  Questions were answered to the patient's satisfaction.    Cath Lab Visit (complete for each Cath Lab visit)  Clinical Evaluation Leading to the Procedure:   ACS: No.  Non-ACS:    Anginal Classification: CCS II  Anti-ischemic medical therapy: Minimal Therapy (1 class of medications)  Non-Invasive Test Results: No non-invasive testing performed  Prior CABG: No previous CABG        Mary Mccarthy

## 2020-09-29 NOTE — Progress Notes (Addendum)
Pt converted back to SB/NSR in the 50s-60s. BP stable. EKG performed. Amio drip temporarily stopped. MD paged.

## 2020-09-29 NOTE — Progress Notes (Addendum)
 Progress Note  Patient Name: Mary Mccarthy Date of Encounter: 09/29/2020  Primary Cardiologist: David Harding, MD   Subjective   Mary Mccarthy is sitting up in the chair resting comfortably.  States that her breathing has significantly improved and that she feels much less anxious.  She was able to lie flat and on 2 pillows yesterday in the evening and was able to lie down with 1 pillow during her CT scans yesterday.  She denies episodes of chest pain or shortness of breath with moving around.  She continues to urinate frequently.  She has no acute concerns at this time, we discussed that when she is able to lie flat we will plan for a cardiac catheterization.  We discussed this procedure again as well as the risks and benefits. Inpatient Medications    Scheduled Meds:  ALPRAZolam  0.25 mg Oral Once   atorvastatin  40 mg Oral Daily   carvedilol  6.25 mg Oral BID WC   docusate sodium  100 mg Oral BID   furosemide  40 mg Intravenous BID   insulin aspart  0-15 Units Subcutaneous TID WC   irbesartan  300 mg Oral Daily   pantoprazole  40 mg Oral Daily   potassium chloride  40 mEq Oral Once   sodium chloride flush  3 mL Intravenous Q12H   Continuous Infusions:  PRN Meds: acetaminophen **OR** acetaminophen, benzonatate, bisacodyl, hydrALAZINE, morphine injection, ondansetron **OR** ondansetron (ZOFRAN) IV, oxyCODONE, polyethylene glycol, traZODone   Vital Signs    Vitals:   09/28/20 2100 09/28/20 2306 09/29/20 0015 09/29/20 0400  BP:  (!) 171/86 (!) 164/66 (!) 145/47  Pulse:  69 61 68  Resp:   20 18  Temp:   98.2 F (36.8 C) 98.8 F (37.1 C)  TempSrc:   Oral Oral  SpO2: 96%  99% 95%  Weight:    90.1 kg  Height:        Intake/Output Summary (Last 24 hours) at 09/29/2020 0812 Last data filed at 09/29/2020 0614 Gross per 24 hour  Intake 703 ml  Output 2225 ml  Net -1522 ml    Filed Weights   09/27/20 0644 09/28/20 0401 09/29/20 0400  Weight: 103 kg 91.2 kg 90.1 kg     Telemetry    Normal sinus rhythm, ST wave changes in leads II and V1, repeat EKG pending- Personally Reviewed  ECG   NSR rate of 65 bpm with PACs present- Personally Reviewed  Physical Exam   GEN: No acute distress, resting comfortably Neck: No JVD appreciated Cardiac: RRR, 3/6 crescendo decrescendo systolic murmur best heard over right second intercostal space Respiratory: Saturating well on 1 L nasal cannula, no respiratory distress.  Decreased breath sounds at the bases bilaterally. GI: Soft, nontender, non-distended  MS: Trace lower extremity edema. Neuro:  Nonfocal  Psych: Normal mood and thought process  Labs    Chemistry Recent Labs  Lab 09/27/20 0705 09/27/20 0739 09/28/20 0309 09/29/20 0440  NA 140 143 138 139  K 3.4* 3.5 3.3* 3.9  CL 105 105 98 95*  CO2 25  --  30 31  GLUCOSE 109* 106* 127* 109*  BUN 15 18 15 17  CREATININE 0.80 0.70 0.90 0.99  CALCIUM 9.2  --  8.9 9.6  PROT 6.8  --   --   --   ALBUMIN 3.3*  --   --   --   AST 21  --   --   --   ALT 16  --   --   --     ALKPHOS 46  --   --   --   BILITOT 1.7*  --   --   --   GFRNONAA >60  --  >60 57*  ANIONGAP 10  --  10 13      Hematology Recent Labs  Lab 09/27/20 0705 09/27/20 0739 09/28/20 0309  WBC 6.7  --  7.1  RBC 3.57*  --  3.41*  HGB 11.5* 11.2* 11.1*  HCT 34.6* 33.0* 32.7*  MCV 96.9  --  95.9  MCH 32.2  --  32.6  MCHC 33.2  --  33.9  RDW 13.4  --  13.5  PLT 270  --  254     Cardiac EnzymesNo results for input(s): TROPONINI in the last 168 hours. No results for input(s): TROPIPOC in the last 168 hours.   BNP Recent Labs  Lab 09/27/20 0705  BNP 398.1*      DDimer No results for input(s): DDIMER in the last 168 hours.   Radiology    CT CORONARY MORPH W/CTA COR W/SCORE W/CA W/CM &/OR WO/CM  Result Date: 09/29/2020 EXAM: OVER-READ INTERPRETATION  CT CHEST The following report is an over-read performed by radiologist Dr. Trudie Reed of [facility] on  09/29/2020. This over-read does not include interpretation of cardiac or coronary anatomy or pathology. The coronary calcium score/coronary CTA interpretation by the cardiologist is attached. COMPARISON:  Cardiac CTA 10/09/2019. FINDINGS: Extracardiac findings will be described separately under dictation for contemporaneously obtained CTA chest, abdomen and pelvis. IMPRESSION: Please see separate dictation for contemporaneously obtained CTA chest, abdomen and pelvis dated 09/28/2020 for full description of relevant extracardiac findings. Electronically Signed   By: Trudie Reed M.D.   On: 09/29/2020 06:07    Cardiac Studies   CTA Chest-2022 Cardiovascular Findings: Heart size is normal. There is no significant pericardial fluid, thickening or pericardial calcification. There is aortic atherosclerosis, as well as atherosclerosis of the great vessels of the mediastinum and the coronary arteries, including calcified atherosclerotic plaque in the left main, left anterior descending and right coronary arteries. Severe thickening calcification of the aortic valve. Severe calcifications of the mitral annulus.    TTE 07/22 1. The aortic valve is calcified. Aortic valve regurgitation is not  visualized. Severe aortic valve stenosis. Aortic valve area, by VTI  measures 0.86 cm. Aortic valve mean gradient measures 42.0 mmHg. Aortic  valve Vmax measures 4.36 m/s.   2. Left ventricular ejection fraction, by estimation, is 55 to 60%. The  left ventricle has normal function. The left ventricle has no regional  wall motion abnormalities. There is severe concentric left ventricular  hypertrophy. Left ventricular diastolic   parameters are indeterminate.   3. Right ventricular systolic function is normal. The right ventricular  size is normal. There is moderately elevated pulmonary artery systolic  pressure.   4. Left atrial size was mildly dilated.   5. The pericardial effusion is circumferential.    6. The mitral valve is abnormal. Trivial mitral valve regurgitation. The  mean mitral valve gradient is 2.0 mmHg with average heart rate of 53 bpm.  Severe mitral annular calcification.   TTE 10/01/2019:  EF 65 to 70%.  Normal RWMA, G1 DD-elevated LAP.  Mildly elevated PAP.  Moderate LA dilation.  Moderate aortic valve stenosis.  (Mean gradient 34.5). ->  Probably still stable.  2019 mean gradient was 36, then reduced to 31 this February 21 -> now 34.5.  Coronary Calcium Score-CTA 10/09/2019: Calcium score 624.  82nd percentile. Dominant RCA: Mild (  25-49%) proximal stenosis-distal bifurcation into PDA and PAV--< RPL branches.  LAD (1 major mid vessel diagonal) diffuse calcified plaque, mild proximal stenosis with minimal distal stenosis.  Small RI moderate ostial disease.  LCx-moderate mixed (50-69%) proximal stenosis.  Small distal OM1 disease.  Trileaflet aortic valve annular calcification of probable stenosis.   LHC 2017 Ost Ramus lesion, 60% stenosed. Ost RCA lesion, 30% stenosed. Dist LAD lesion, tapers to a small vessel with diffuse roughly 40-50% stenosis. The left ventricular systolic function is normal. Elevated LVEDP  Patient Profile     80 y.o. female hx of moderate-severe aortic stenosis, HFpEF, non obstructive CAD, atrial fibrillation on eliquis, CVA, HTN, pulmonary HTN who is being seen today for the evaluation of acute heart failure exacerbation at the request of [facility].  Assessment & Plan    Acute on Chronic Diastolic Heart Failure Exacerbation History of non-ischemic HFpEF, recent echo 07/22 EF 55-60%. Responding to IV lasix 40 mg BID. 24 hr I/O of -1. mL, cumulative I/O - . Weight down 1 kg from yesterday.  Blood pressure has improved as well as her oxygen supplementation needs.  JVD and lower extremity swelling improving. Cr stable and electorlytes have improved.able to lie flat with 1-2 pillows yesterday without feeling short of breath.  CTA chest  yesterday revealed trace bilateral pleural effusions right greater than left.  Plan for heart catheterization today. -continue IV lasix 40 mg BID -continue carvedilol 6.25 BID, no signs of hypoperfusion on exam -Mg stabilized to 2.5. K of 3.9. Continue daily bmp,mag  -strict I/O and daily weights -daily weights/strict I/O's -work with PT and ambulate w/wo O2 to determine oxygen needs  Hypertensive Urgency Restarted irbesartan yesterday, BP improving. Cr stable.  -continue coreg 6.25 BID, irbesartan 300 mg daily.  -continue diuretic therapy as per above -Continue to monitor renal function   Aortic Stenosis Hx of moderate-severe aortic stenosis. Last echocardiogram 7/22 with aortic valve features of peak velocity 4.36 m/s, mean gradient 42 mm Hg, AV area 0.86 cm, dimensionless index 0.27. Planning for aortic valve replacement, pre-TAVR workup during this admission.  Plan for cardiac catheterization tomorrow.  Patient with CT coronary as well as CT angio chest and abdomen pelvis yesterday.  CT results with aortic atherosclerosis, as well as atherosclerosis of the coronary arteries; left main, left anterior descending and right coronary is.  She also has severe thickening of the aortic valve and severe calcifications of mitral annulus. -Plan for cardiac catheterization today -Patient made NPO, last dose of eliquis was yesterday evening. Holding at this time -PT evaluated patient and recommending home health PT as well as supervision.   Atrial Fibrillation NSR on telemetry, no incidence of arrhythmia on telemetry.  -continue cardiac monitoring -hold xarelto in anticipation of her cardiac cath today, carvedilol 6.25 mg BID    Hx of non-occlusive CAD LHC in 2017, Ost ramus lesion 60% stenosed, Ost RCA lesion 30% stenosed, distal LAD tapers to small vessel with diffuse roughly 40-50% stenosis.  Evidence of atherosclerosis on CTA.  Planning for heart catheterization today. Did have some ST changes  on telemtry. Repeat EKG pending. She has remained chest pain free -continue atorvastatin 40 mg daily and DM management -CT coronary results as per above -L&RHC today   Hx of CVA Patient admitted 07/22 with shower of strokes thought to be embolic in etiology 2/2 atrial fibrillation. She was started on eliquis and has been adherent to therapy.  -hold eliquis for L&RHC -continue statin therapy and DM management  HLD Continue atorvastatin 40 mg daily. Last lipid panel 07/22, total C of 97 and LDL of 48.    T2DM Last A1c of 7.4 07/22, medication regimen of metformin and glimepiride.  -SSI, management per primary team  Hx of Anxiety Disorder Per recent documentation PCP discontinued her benzodiazepine recently. Improvement of anxiety overnight, patient states she feels much better.  -management per primary team   Incidental Pulmonary Nodules  Patient found to have 5 mm pulmonary nodule on lateral segment of right middle lobe. Can consider repeat imaging in 1 year.   For questions or updates, please contact [facility] Please consult [email or web address] for contact info under Cardiology/STEMI.   Signed, Thalia Bloodgood DO  Internal Medicine Resident PGY-2 [place]  Pager: [phone]  Patient seen, examined. Available data reviewed. Agree with findings, assessment, and plan as outlined by Dr Evie Lacks.  The patient is comfortable.  She is in no acute distress.  JVP is normal, lungs are clear bilaterally, heart is regular rate and rhythm with 3/6 harsh crescendo decrescendo murmur at the right upper sternal border, abdomen is soft and nontender, extremities have trace edema.  The patient is clinically improved with IV diuresis and current medical therapy.  She underwent CT angiography studies of the heart and the chest, abdomen, and pelvis yesterday.  She appears to have transfemoral access for TAVR.  The gated cardiac scan interpretation is still pending.  I think she is stable to  undergo right and left heart catheterization today in preparation for TAVR. I have reviewed the risks, indications, and alternatives to cardiac catheterization, possible angioplasty, and stenting with the patient. Risks include but are not limited to bleeding, infection, vascular injury, stroke, myocardial infection, arrhythmia, kidney injury, radiation-related injury in the case of prolonged fluoroscopy use, emergency cardiac surgery, and death. The patient understands the risks of serious complication is 1-2 in 1000 with diagnostic cardiac cath and 1-2% or less with angioplasty/stenting.  Once her cardiac catheterization is completed, we do not anticipate further inpatient work-up for TAVR.  We will arrange an outpatient cardiac surgical consultation with Dr. Laneta Simmers.  Further plans/disposition pending her cardiac catheterization results today.  Her last dose of apixaban was last night so she will be scheduled for cardiac catheterization late in the day with anticipated right radial approach.  Tonny Bollman, M.D. 09/29/2020 11:26 AM

## 2020-09-30 ENCOUNTER — Encounter (HOSPITAL_COMMUNITY): Payer: Self-pay | Admitting: Cardiovascular Disease

## 2020-09-30 DIAGNOSIS — I509 Heart failure, unspecified: Secondary | ICD-10-CM | POA: Diagnosis not present

## 2020-09-30 DIAGNOSIS — I350 Nonrheumatic aortic (valve) stenosis: Secondary | ICD-10-CM | POA: Diagnosis not present

## 2020-09-30 LAB — GLUCOSE, CAPILLARY
Glucose-Capillary: 118 mg/dL — ABNORMAL HIGH (ref 70–99)
Glucose-Capillary: 157 mg/dL — ABNORMAL HIGH (ref 70–99)
Glucose-Capillary: 119 mg/dL — ABNORMAL HIGH (ref 70–99)
Glucose-Capillary: 160 mg/dL — ABNORMAL HIGH (ref 70–99)

## 2020-09-30 LAB — BASIC METABOLIC PANEL
Anion gap: 8 (ref 5–15)
BUN: 21 mg/dL (ref 8–23)
CO2: 30 mmol/L (ref 22–32)
Calcium: 8.4 mg/dL — ABNORMAL LOW (ref 8.9–10.3)
Chloride: 98 mmol/L (ref 98–111)
Creatinine, Ser: 1.38 mg/dL — ABNORMAL HIGH (ref 0.44–1.00)
GFR, Estimated: 38 mL/min — ABNORMAL LOW (ref >60)
Glucose, Bld: 144 mg/dL — ABNORMAL HIGH (ref 70–99)
Potassium: 3.9 mmol/L (ref 3.5–5.1)
Sodium: 136 mmol/L (ref 135–145)

## 2020-09-30 LAB — CBC
HCT: 31.6 % — ABNORMAL LOW (ref 36.0–46.0)
Hemoglobin: 10.4 g/dL — ABNORMAL LOW (ref 12.0–15.0)
MCH: 32.1 pg (ref 26.0–34.0)
MCHC: 32.9 g/dL (ref 30.0–36.0)
MCV: 97.5 fL (ref 80.0–100.0)
Platelets: 241 10*3/uL (ref 150–400)
RBC: 3.24 MIL/uL — ABNORMAL LOW (ref 3.87–5.11)
RDW: 13.3 % (ref 11.5–15.5)
WBC: 5.3 10*3/uL (ref 4.0–10.5)
nRBC: 0 % (ref 0.0–0.2)

## 2020-09-30 LAB — MAGNESIUM: Magnesium: 2 mg/dL (ref 1.7–2.4)

## 2020-09-30 MED ORDER — AMIODARONE HCL 200 MG PO TABS
400.0000 mg | ORAL_TABLET | Freq: Every day | ORAL | Status: DC
  Administered 2020-09-30 – 2020-10-01 (×2): 400 mg via ORAL
  Filled 2020-09-30 (×2): qty 2

## 2020-09-30 MED ORDER — AMLODIPINE BESYLATE 5 MG PO TABS
5.0000 mg | ORAL_TABLET | Freq: Every day | ORAL | Status: DC
  Administered 2020-09-30 – 2020-10-01 (×2): 5 mg via ORAL
  Filled 2020-09-30 (×2): qty 1

## 2020-09-30 NOTE — Progress Notes (Signed)
   09/30/20 1150  Mobility  Activity Refused mobility (Asked to return after lunch)

## 2020-09-30 NOTE — Discharge Instructions (Signed)

## 2020-09-30 NOTE — Progress Notes (Signed)
[facility] PROGRESS NOTE    Progress Note  Mary Mccarthy  ZOX:[id number] DOB: 1940-05-27 DOA: 09/27/2020 PCP: Corwin Levins, MD     Brief Narrative:   Mary Mccarthy is an 80 y.o. female past medical history significant for CAD, essential hypertension atrial fibrillation on Eliquis morbid obesity comes into the ED complaining of chest tightness SARS-CoV-2 PCR is negative.  She was treated empirically with doxycycline by her PCP she felt volume overloaded develop a cough 2D echo was done that showed severe aortic stenosis on her previous hospitalization. Cardiology was consulted she was admitted for heart failure exacerbation started on IV diuresis.    Assessment/Plan:  Acute on chronic diastolic CHF (congestive heart failure) (HCC): With a history of nonischemic recent 2D echo showed an EF of 60% she responded well to IV Lasix. Continue Coreg p.o. twice daily monitor electrolytes and replete as needed. Has been weaned to room air satting greater than 92%, she relates she was able to lay flat to sleep. Cath showed nonobstructive coronary artery disease severe aortic stenosis with a mean gradient of 53 mmHg Continue strict I's and O's and daily weights.  She is negative about 3 L. Mild bump in her creatinine she appears euvolemic on physical exam. Hold Lasix today and restart tomorrow morning.  Hold Avapro for today.  Severe aortic stenosis: Cardiology was consulted pre-TAVR, CT while she is admitted. Right and left heart cath today.  New onset A. fib with RVR: Started on IV amiodarone and Eliquis. Further management per cards  Essential hypertension: Continue Coreg Avapro and diuretic.  Chronic atrial fibrillation: Rate controlled Coreg and Eliquis.  Morbid obesity: Outpatient follow-up.  History of CVA: Continue Eliquis.  Hypokalemia: Replete orally recheck in the morning.     DVT prophylaxis: eliquis Family Communication:none Status is:  Inpatient  Remains inpatient appropriate because:Hemodynamically unstable  Dispo: The patient is from: Home              Anticipated d/c is to: Home              Patient currently is not medically stable to d/c.   Difficult to place patient No        Code Status:     Code Status Orders  (From admission, onward)           Start     Ordered   09/27/20 1306  Full code  Continuous        09/27/20 1306           Code Status History     Date Active Date Inactive Code Status Order ID Comments User Context   08/10/2020 0651 08/12/2020 2030 Full Code [id number]  Marinda Elk, MD ED   05/18/2015 0020 05/23/2015 1903 Full Code [id number]  [name], MD ED         IV Access:   Peripheral IV   Procedures and diagnostic studies:   CARDIAC CATHETERIZATION  Result Date: 09/29/2020   Prox RCA lesion is 20% stenosed.   Mid RCA to Dist RCA lesion is 20% stenosed.   Ramus lesion is 30% stenosed.   Mid LAD lesion is 20% stenosed.   Dist LM to Prox LAD lesion is 20% stenosed. Mild non-obstructive CAD Severe aortic stenosis (mean gradient 52.8 mmHg, peak to peak gradient 58 mmHg, AVA 0.90 cm2). Recommendations: Will continue workup for TAVR   CT CORONARY MORPH W/CTA COR W/SCORE W/CA W/CM &/OR WO/CM  Addendum Date: 09/29/2020   ADDENDUM REPORT:  09/29/2020 13:14 CLINICAL DATA:  Pre-op transcatheter aortic valve replacement (TAVR) EXAM: Cardiac TAVR CT TECHNIQUE: The patient was scanned on a Siemens Force 192 slice scanner. A 120 kV retrospective scan was triggered in the descending thoracic aorta at 111 HU's. Gantry rotation speed was 270 msecs and collimation was .9 mm. The 3D data set was reconstructed in 5% intervals of the R-R cycle. Systolic and diastolic phases were analyzed on a dedicated work station using MPR, MIP and VRT modes. The patient received 40mL OMNIPAQUE IOHEXOL 350 MG/ML SOLN of contrast. FINDINGS: Aortic Valve: Tricuspid aortic valve. Severely reduced cusp  separation. Severely thickened, moderately calcified aortic valve cusps. AV calcium score: 1049 Virtual Basal Annulus Measurements: Maximum/Minimum Diameter: 23.1 mm x 18.9 mm Perimeter: 65.4 mm Area: 328 mm2 No significant LVOT calcifications. Based on these measurements, the annulus would be suitable for a 20 mm Edwards Sapien 3 valve vs 23 mm Medtronic Evolut Pro valve. Sinus dimensions are borderline for 26 mm Medtronic Evolut Pro valve. Recommend Heart Team discussion for valve sizing. Sinus of Valsalva Measurements: Non-coronary:  26 mm Right - coronary:  26 mm Left - coronary:  27 mm Sinus of Valsalva Height: Left: 17.5 mm Right: 18.6 mm Aorta: Mild aortic atherosclerosis. Sinotubular Junction: 27 mm Ascending Thoracic Aorta:  31 mm Aortic Arch:  24 mm Descending Thoracic Aorta:  22 mm Coronary Artery Height above Annulus: Left Main: 13 mm Right Coronary: 11.6 mm Coronary Arteries: Normal origins. 3 vessel coronary artery calcifications. Optimum Fluoroscopic Angle for Delivery: RAO 1, CRA 1 Moderate mitral annular calcification. No left atrial appendage thrombus. IMPRESSION: 1. Tricuspid aortic valve. Severely reduced cusp separation. Severely thickened, moderately calcified aortic valve cusps. 2.  AV calcium score: 1049 3. Annulus area: 328 mm2, no significant LVOT calcifications. Based on these measurements, the annulus would be suitable for a 20 mm Edwards Sapien 3 valve vs 23 mm Medtronic Evolut Pro valve. Sinus dimensions are borderline for 26 mm Medtronic Evolut Pro valve. Recommend Heart Team discussion for valve sizing. 4.  Sufficient coronary artery height from annulus. 5.  Optimum Fluoroscopic Angle for Delivery: RAO 1, CRA 1 Electronically Signed   By: Weston Brass M.D.   On: 09/29/2020 13:14   Result Date: 09/29/2020 EXAM: OVER-READ INTERPRETATION  CT CHEST The following report is an over-read performed by radiologist Dr. Trudie Reed of [facility] on 09/29/2020. This  over-read does not include interpretation of cardiac or coronary anatomy or pathology. The coronary calcium score/coronary CTA interpretation by the cardiologist is attached. COMPARISON:  Cardiac CTA 10/09/2019. FINDINGS: Extracardiac findings will be described separately under dictation for contemporaneously obtained CTA chest, abdomen and pelvis. IMPRESSION: Please see separate dictation for contemporaneously obtained CTA chest, abdomen and pelvis dated 09/28/2020 for full description of relevant extracardiac findings. Electronically Signed: By: Trudie Reed M.D. On: 09/29/2020 06:07   CT ANGIO CHEST AORTA W/CM & OR WO/CM  Result Date: 09/29/2020 CLINICAL DATA:  80 year old female with history of severe aortic stenosis. Preprocedural study prior to potential transcatheter aortic valve replacement (TAVR). EXAM: CT ANGIOGRAPHY CHEST, ABDOMEN AND PELVIS TECHNIQUE: Multidetector CT imaging through the chest, abdomen and pelvis was performed using the standard protocol during bolus administration of intravenous contrast. Multiplanar reconstructed images and MIPs were obtained and reviewed to evaluate the vascular anatomy. CONTRAST:  85mL OMNIPAQUE IOHEXOL 350 MG/ML SOLN COMPARISON:  Cardiac CT 10/09/2019. FINDINGS: CTA CHEST FINDINGS Cardiovascular: Heart size is normal. There is no significant pericardial fluid, thickening or pericardial calcification. There is  aortic atherosclerosis, as well as atherosclerosis of the great vessels of the mediastinum and the coronary arteries, including calcified atherosclerotic plaque in the left main, left anterior descending and right coronary arteries. Severe thickening calcification of the aortic valve. Severe calcifications of the mitral annulus. Mediastinum/Lymph Nodes: No pathologically enlarged mediastinal or hilar lymph nodes. Esophagus is unremarkable in appearance. No axillary lymphadenopathy. Lungs/Pleura: Trace bilateral pleural effusions lying dependently (right  greater than left). Associated areas of passive subsegmental atelectasis in the lower lobes of the lungs bilaterally. No acute consolidative airspace disease. 5 mm pulmonary nodule in the lateral segment of the right middle lobe (axial image 61 of series 7). Musculoskeletal/Soft Tissues: There are no aggressive appearing lytic or blastic lesions noted in the visualized portions of the skeleton. CTA ABDOMEN AND PELVIS FINDINGS Hepatobiliary: No suspicious cystic or solid hepatic lesions. No intra or extrahepatic biliary ductal dilatation. Gallbladder is normal in appearance. Pancreas: No pancreatic mass. No pancreatic ductal dilatation. No pancreatic or peripancreatic fluid collections or inflammatory changes. Spleen: Unremarkable. Adrenals/Urinary Tract: Bilateral kidneys and adrenal glands are normal in appearance. No hydroureteronephrosis. Urinary bladder is normal in appearance. Stomach/Bowel: The appearance of the stomach is normal. No pathologic dilatation of small bowel or colon. The appendix is not confidently identified and may be surgically absent. Regardless, there are no inflammatory changes noted adjacent to the cecum to suggest the presence of an acute appendicitis at this time. Vascular/Lymphatic: Aortic atherosclerosis, without evidence of aneurysm or dissection in the abdominal or pelvic vasculature. Vascular findings and measurements pertinent to potential TAVR procedure, as detailed below. No lymphadenopathy noted in the abdomen or pelvis. Reproductive: Uterus and ovaries are trophic. Other: No significant volume of ascites.  No pneumoperitoneum. Musculoskeletal: There are no aggressive appearing lytic or blastic lesions noted in the visualized portions of the skeleton. VASCULAR MEASUREMENTS PERTINENT TO TAVR: AORTA: Minimal Aortic Diameter-14 x 15 mm Severity of Aortic Calcification-mild RIGHT PELVIS: Right Common Iliac Artery - Minimal Diameter-8.8 x 9.1 mm Tortuosity-mild Calcification-minimal  Right External Iliac Artery - Minimal Diameter-6.9 x 6.5 mm Tortuosity-moderate Calcification-none Right Common Femoral Artery - Minimal Diameter-7.1 x 6.9 mm Tortuosity-mild Calcification-minimal LEFT PELVIS: Left Common Iliac Artery - Minimal Diameter-9.6 x 8.8 mm Tortuosity-mild Calcification-none Left External Iliac Artery - Minimal Diameter-6.6 x 7.0 mm Tortuosity-moderate Calcification-none Left Common Femoral Artery - Minimal Diameter-6.7 x 6.8 mm Tortuosity-mild Calcification-minimal Review of the MIP images confirms the above findings. IMPRESSION: 1. Vascular findings and measurements pertinent to potential TAVR procedure, as detailed above. 2. Severe thickening calcification of the aortic valve, compatible with reported clinical history of severe aortic stenosis. 3. 5 mm pulmonary nodule in the lateral segment of the right middle lobe. No follow-up needed if patient is low-risk. Non-contrast chest CT can be considered in 12 months if patient is high-risk. This recommendation follows the consensus statement: Guidelines for Management of Incidental Pulmonary Nodules Detected on CT Images: From the Fleischner Society 2017; Radiology 2017; 284:228-243. 4. Trace bilateral pleural effusions (right greater than left) with areas of passive subsegmental atelectasis in the lower lobes of the lungs bilaterally. 5. Mild cardiomegaly. 6. Aortic atherosclerosis, in addition to left main and 3 vessel coronary artery disease. 7. Severe calcifications of the mitral annulus. 8. Additional incidental findings, as above. Electronically Signed   By: Trudie Reed M.D.   On: 09/29/2020 10:05   CT Angio Abd/Pel w/ and/or w/o  Result Date: 09/29/2020 CLINICAL DATA:  80 year old female with history of severe aortic stenosis. Preprocedural study prior to potential transcatheter  aortic valve replacement (TAVR). EXAM: CT ANGIOGRAPHY CHEST, ABDOMEN AND PELVIS TECHNIQUE: Multidetector CT imaging through the chest, abdomen and  pelvis was performed using the standard protocol during bolus administration of intravenous contrast. Multiplanar reconstructed images and MIPs were obtained and reviewed to evaluate the vascular anatomy. CONTRAST:  95mL OMNIPAQUE IOHEXOL 350 MG/ML SOLN COMPARISON:  Cardiac CT 10/09/2019. FINDINGS: CTA CHEST FINDINGS Cardiovascular: Heart size is normal. There is no significant pericardial fluid, thickening or pericardial calcification. There is aortic atherosclerosis, as well as atherosclerosis of the great vessels of the mediastinum and the coronary arteries, including calcified atherosclerotic plaque in the left main, left anterior descending and right coronary arteries. Severe thickening calcification of the aortic valve. Severe calcifications of the mitral annulus. Mediastinum/Lymph Nodes: No pathologically enlarged mediastinal or hilar lymph nodes. Esophagus is unremarkable in appearance. No axillary lymphadenopathy. Lungs/Pleura: Trace bilateral pleural effusions lying dependently (right greater than left). Associated areas of passive subsegmental atelectasis in the lower lobes of the lungs bilaterally. No acute consolidative airspace disease. 5 mm pulmonary nodule in the lateral segment of the right middle lobe (axial image 61 of series 7). Musculoskeletal/Soft Tissues: There are no aggressive appearing lytic or blastic lesions noted in the visualized portions of the skeleton. CTA ABDOMEN AND PELVIS FINDINGS Hepatobiliary: No suspicious cystic or solid hepatic lesions. No intra or extrahepatic biliary ductal dilatation. Gallbladder is normal in appearance. Pancreas: No pancreatic mass. No pancreatic ductal dilatation. No pancreatic or peripancreatic fluid collections or inflammatory changes. Spleen: Unremarkable. Adrenals/Urinary Tract: Bilateral kidneys and adrenal glands are normal in appearance. No hydroureteronephrosis. Urinary bladder is normal in appearance. Stomach/Bowel: The appearance of the stomach  is normal. No pathologic dilatation of small bowel or colon. The appendix is not confidently identified and may be surgically absent. Regardless, there are no inflammatory changes noted adjacent to the cecum to suggest the presence of an acute appendicitis at this time. Vascular/Lymphatic: Aortic atherosclerosis, without evidence of aneurysm or dissection in the abdominal or pelvic vasculature. Vascular findings and measurements pertinent to potential TAVR procedure, as detailed below. No lymphadenopathy noted in the abdomen or pelvis. Reproductive: Uterus and ovaries are trophic. Other: No significant volume of ascites.  No pneumoperitoneum. Musculoskeletal: There are no aggressive appearing lytic or blastic lesions noted in the visualized portions of the skeleton. VASCULAR MEASUREMENTS PERTINENT TO TAVR: AORTA: Minimal Aortic Diameter-14 x 15 mm Severity of Aortic Calcification-mild RIGHT PELVIS: Right Common Iliac Artery - Minimal Diameter-8.8 x 9.1 mm Tortuosity-mild Calcification-minimal Right External Iliac Artery - Minimal Diameter-6.9 x 6.5 mm Tortuosity-moderate Calcification-none Right Common Femoral Artery - Minimal Diameter-7.1 x 6.9 mm Tortuosity-mild Calcification-minimal LEFT PELVIS: Left Common Iliac Artery - Minimal Diameter-9.6 x 8.8 mm Tortuosity-mild Calcification-none Left External Iliac Artery - Minimal Diameter-6.6 x 7.0 mm Tortuosity-moderate Calcification-none Left Common Femoral Artery - Minimal Diameter-6.7 x 6.8 mm Tortuosity-mild Calcification-minimal Review of the MIP images confirms the above findings. IMPRESSION: 1. Vascular findings and measurements pertinent to potential TAVR procedure, as detailed above. 2. Severe thickening calcification of the aortic valve, compatible with reported clinical history of severe aortic stenosis. 3. 5 mm pulmonary nodule in the lateral segment of the right middle lobe. No follow-up needed if patient is low-risk. Non-contrast chest CT can be considered  in 12 months if patient is high-risk. This recommendation follows the consensus statement: Guidelines for Management of Incidental Pulmonary Nodules Detected on CT Images: From the Fleischner Society 2017; Radiology 2017; 284:228-243. 4. Trace bilateral pleural effusions (right greater than left) with areas of passive subsegmental  atelectasis in the lower lobes of the lungs bilaterally. 5. Mild cardiomegaly. 6. Aortic atherosclerosis, in addition to left main and 3 vessel coronary artery disease. 7. Severe calcifications of the mitral annulus. 8. Additional incidental findings, as above. Electronically Signed   By: Trudie Reed M.D.   On: 09/29/2020 10:05     Medical Consultants:   None.   Subjective:    [name] feels great, short of breath was able to lay flat to sleep  Objective:    Vitals:   09/30/20 0329 09/30/20 0330 09/30/20 0358 09/30/20 0759  BP:   (!) 147/78 (!) 160/58  Pulse:    62  Resp:   14 20  Temp:   98.8 F (37.1 C) 97.8 F (36.6 C)  TempSrc:   Oral Oral  SpO2: (!) 80% 97% 99% 97%  Weight:  90.8 kg    Height:       SpO2: 97 % O2 Flow Rate (L/min): 2 L/min   Intake/Output Summary (Last 24 hours) at 09/30/2020 0924 Last data filed at 09/30/2020 0358 Gross per 24 hour  Intake 266.65 ml  Output 1600 ml  Net -1333.35 ml    Filed Weights   09/28/20 0401 09/29/20 0400 09/30/20 0330  Weight: 91.2 kg 90.1 kg 90.8 kg    Exam: General exam: In no acute distress. Respiratory system: Good air movement and clear to auscultation. Cardiovascular system: S1 & S2 heard, RRR. No JVD. Gastrointestinal system: Abdomen is nondistended, soft and nontender.  Extremities: No pedal edema. Skin: No rashes, lesions or ulcers  Data Reviewed:    Labs: Basic Metabolic Panel: Recent Labs  Lab 09/27/20 0705 09/27/20 0739 09/28/20 0309 09/29/20 0440 09/29/20 1548 09/29/20 1549 09/30/20 0119  NA 140 143 138 139 141 141 136  K 3.4* 3.5 3.3* 3.9 3.7 3.8 3.9   CL 105 105 98 95*  --   --  98  CO2 25  --  30 31  --   --  30  GLUCOSE 109* 106* 127* 109*  --   --  144*  BUN 15 18 15 17   --   --  21  CREATININE 0.80 0.70 0.90 0.99  --   --  1.38*  CALCIUM 9.2  --  8.9 9.6  --   --  8.4*  MG  --   --  0.8* 2.5*  --   --  2.0    GFR Estimated Creatinine Clearance: 32.8 mL/min (A) (by C-G formula based on SCr of 1.38 mg/dL (H)). Liver Function Tests: Recent Labs  Lab 09/27/20 0705  AST 21  ALT 16  ALKPHOS 46  BILITOT 1.7*  PROT 6.8  ALBUMIN 3.3*    No results for input(s): LIPASE, AMYLASE in the last 168 hours. No results for input(s): AMMONIA in the last 168 hours. Coagulation profile No results for input(s): INR, PROTIME in the last 168 hours. COVID-19 Labs  No results for input(s): DDIMER, FERRITIN, LDH, CRP in the last 72 hours.  Lab Results  Component Value Date   SARSCOV2NAA NEGATIVE 09/27/2020   SARSCOV2NAA NEGATIVE 08/10/2020    CBC: Recent Labs  Lab 09/27/20 0705 09/27/20 0739 09/28/20 0309 09/29/20 1548 09/29/20 1549 09/30/20 0119  WBC 6.7  --  7.1  --   --  5.3  NEUTROABS 4.5  --   --   --   --   --   HGB 11.5* 11.2* 11.1* 9.9* 10.5* 10.4*  HCT 34.6* 33.0* 32.7* 29.0* 31.0* 31.6*  MCV  96.9  --  95.9  --   --  97.5  PLT 270  --  254  --   --  241    Cardiac Enzymes: No results for input(s): CKTOTAL, CKMB, CKMBINDEX, TROPONINI in the last 168 hours. BNP (last 3 results) No results for input(s): PROBNP in the last 8760 hours. CBG: Recent Labs  Lab 09/29/20 0612 09/29/20 1201 09/29/20 1655 09/29/20 2110 09/30/20 0613  GLUCAP 105* 132* 102* 251* 118*    D-Dimer: No results for input(s): DDIMER in the last 72 hours. Hgb A1c: No results for input(s): HGBA1C in the last 72 hours. Lipid Profile: No results for input(s): CHOL, HDL, LDLCALC, TRIG, CHOLHDL, LDLDIRECT in the last 72 hours. Thyroid function studies: No results for input(s): TSH, T4TOTAL, T3FREE, THYROIDAB in the last 72 hours.  Invalid  input(s): FREET3 Anemia work up: No results for input(s): VITAMINB12, FOLATE, FERRITIN, TIBC, IRON, RETICCTPCT in the last 72 hours. Sepsis Labs: Recent Labs  Lab 09/27/20 0705 09/28/20 0309 09/30/20 0119  WBC 6.7 7.1 5.3    Microbiology Recent Results (from the past 240 hour(s))  Resp Panel by RT-PCR (Flu A&B, Covid) Nasopharyngeal Swab     Status: None   Collection Time: 09/27/20  6:40 AM   Specimen: Nasopharyngeal Swab; Nasopharyngeal(NP) swabs in vial transport medium  Result Value Ref Range Status   SARS Coronavirus 2 by RT PCR NEGATIVE NEGATIVE Final    Comment: (NOTE) SARS-CoV-2 target nucleic acids are NOT DETECTED.  The SARS-CoV-2 RNA is generally detectable in upper respiratory specimens during the acute phase of infection. The lowest concentration of SARS-CoV-2 viral copies this assay can detect is 138 copies/mL. A negative result does not preclude SARS-Cov-2 infection and should not be used as the sole basis for treatment or other patient management decisions. A negative result may occur with  improper specimen collection/handling, submission of specimen other than nasopharyngeal swab, presence of viral mutation(s) within the areas targeted by this assay, and inadequate number of viral copies(<138 copies/mL). A negative result must be combined with clinical observations, patient history, and epidemiological information. The expected result is Negative.  Fact Sheet for Patients:  BloggerCourse.com  Fact Sheet for Healthcare Providers:  SeriousBroker.it  This test is no t yet approved or cleared by the [place] FDA and  has been authorized for detection and/or diagnosis of SARS-CoV-2 by FDA under an Emergency Use Authorization (EUA). This EUA will remain  in effect (meaning this test can be used) for the duration of the COVID-19 declaration under Section 564(b)(1) of the Act, 21 U.S.C.section 360bbb-3(b)(1),  unless the authorization is terminated  or revoked sooner.       Influenza A by PCR NEGATIVE NEGATIVE Final   Influenza B by PCR NEGATIVE NEGATIVE Final    Comment: (NOTE) The Xpert Xpress SARS-CoV-2/FLU/RSV plus assay is intended as an aid in the diagnosis of influenza from Nasopharyngeal swab specimens and should not be used as a sole basis for treatment. Nasal washings and aspirates are unacceptable for Xpert Xpress SARS-CoV-2/FLU/RSV testing.  Fact Sheet for Patients: BloggerCourse.com  Fact Sheet for Healthcare Providers: SeriousBroker.it  This test is not yet approved or cleared by the [place] FDA and has been authorized for detection and/or diagnosis of SARS-CoV-2 by FDA under an Emergency Use Authorization (EUA). This EUA will remain in effect (meaning this test can be used) for the duration of the COVID-19 declaration under Section 564(b)(1) of the Act, 21 U.S.C. section 360bbb-3(b)(1), unless the authorization is terminated or  revoked.  Performed at [facility], [address] [place], [place] [address]      Medications:    ALPRAZolam  0.25 mg Oral Once   apixaban  5 mg Oral BID   aspirin EC  81 mg Oral Daily   atorvastatin  40 mg Oral Daily   carvedilol  6.25 mg Oral BID WC   docusate sodium  100 mg Oral BID   furosemide  40 mg Intravenous BID   insulin aspart  0-15 Units Subcutaneous TID WC   irbesartan  300 mg Oral Daily   pantoprazole  40 mg Oral Daily   potassium chloride  40 mEq Oral Once   sodium chloride flush  3 mL Intravenous Q12H   Continuous Infusions:  sodium chloride     amiodarone        LOS: 3 days   Marinda Elk  [facility]  09/30/2020, 9:24 AM

## 2020-09-30 NOTE — Progress Notes (Signed)
Pre TAVR [facility] completed.  

## 2020-09-30 NOTE — Progress Notes (Signed)
  Mobility Specialist Criteria Algorithm Info.  SATURATION QUALIFICATIONS: (This note is used to comply with regulatory documentation for home oxygen)  Patient Saturations on Room Air at Rest = 95%  Patient Saturations on Room Air while Ambulating = 93%  Patient Saturations on 0 Liters of oxygen while Ambulating = n/a%  Please briefly explain why patient needs home oxygen:  Mobility Team:  [facility] elevated:Self regulated Activity: Ambulated in hall; Transferred to/from [facility] (in chair before after ambulation) Range of motion: Active; All extremities Level of assistance: Standby assist, set-up cues, supervision of patient - no hands on Assistive device: None; Other (Comment) (Wall rails) Minutes sitting in chair:  Minutes stood: 7 minutes Minutes ambulated: 7 minutes Distance ambulated (ft): 260 ft Mobility response: Tolerated well Bed Position: Semi-fowlers  Patient agreed to participate in mobility this afternoon. Was received sitting in recliner chair. Stood and ambulated in hallway 260 feet with steady gait, occassionally using railing in hallway for stability. Required standing rest break x1 secondary to fatigue. Tolerated ambulation well without incident or complaint and was left sitting in recliner chair with all needs met.   09/30/2020 4:46 PM

## 2020-09-30 NOTE — Progress Notes (Addendum)
Progress Note  Patient Name: Mary Mccarthy Date of Encounter: 09/30/2020  [facility] Cardiologist: Bryan Lemma, MD   Subjective   Feeling well this morning. No complaints  Inpatient Medications    Scheduled Meds:  ALPRAZolam  0.25 mg Oral Once   amLODipine  5 mg Oral Daily   apixaban  5 mg Oral BID   aspirin EC  81 mg Oral Daily   atorvastatin  40 mg Oral Daily   carvedilol  6.25 mg Oral BID WC   docusate sodium  100 mg Oral BID   insulin aspart  0-15 Units Subcutaneous TID WC   pantoprazole  40 mg Oral Daily   potassium chloride  40 mEq Oral Once   sodium chloride flush  3 mL Intravenous Q12H   Continuous Infusions:  sodium chloride     amiodarone     PRN Meds: sodium chloride, acetaminophen **OR** acetaminophen, benzonatate, bisacodyl, morphine injection, ondansetron **OR** ondansetron (ZOFRAN) IV, oxyCODONE, polyethylene glycol, sodium chloride flush, traZODone   Vital Signs    Vitals:   09/30/20 0329 09/30/20 0330 09/30/20 0358 09/30/20 0759  BP:   (!) 147/78 (!) 160/58  Pulse:    62  Resp:   14 20  Temp:   98.8 F (37.1 C) 97.8 F (36.6 C)  TempSrc:   Oral Oral  SpO2: (!) 80% 97% 99% 97%  Weight:  90.8 kg    Height:        Intake/Output Summary (Last 24 hours) at 09/30/2020 1007 Last data filed at 09/30/2020 0358 Gross per 24 hour  Intake 266.65 ml  Output 1600 ml  Net -1333.35 ml   Last 3 Weights 09/30/2020 09/29/2020 09/28/2020  Weight (lbs) 200 lb 1.6 oz 198 lb 9.6 oz 201 lb 1.6 oz  Weight (kg) 90.765 kg 90.084 kg 91.218 kg      Telemetry    Afib RVR with conversion to SR--> PACs - Personally Reviewed  ECG    No new tracing  Physical Exam   GEN: No acute distress.   Neck: No JVD Cardiac: RRR, 3/6 systolic murmur, no rubs, or gallops.  Respiratory: Clear to auscultation bilaterally. GI: Soft, nontender, non-distended  MS: No edema; No deformity. Right radial cath site stable. Neuro:  Nonfocal  Psych: Normal affect   Labs     High Sensitivity Troponin:   Recent Labs  Lab 09/27/20 0705 09/27/20 0935  TROPONINIHS 77* 74*      Chemistry Recent Labs  Lab 09/27/20 0705 09/27/20 0739 09/28/20 0309 09/29/20 0440 09/29/20 1548 09/29/20 1549 09/30/20 0119  NA 140   < > 138 139 141 141 136  K 3.4*   < > 3.3* 3.9 3.7 3.8 3.9  CL 105   < > 98 95*  --   --  98  CO2 25  --  30 31  --   --  30  GLUCOSE 109*   < > 127* 109*  --   --  144*  BUN 15   < > 15 17  --   --  21  CREATININE 0.80   < > 0.90 0.99  --   --  1.38*  CALCIUM 9.2  --  8.9 9.6  --   --  8.4*  PROT 6.8  --   --   --   --   --   --   ALBUMIN 3.3*  --   --   --   --   --   --  AST 21  --   --   --   --   --   --   ALT 16  --   --   --   --   --   --   ALKPHOS 46  --   --   --   --   --   --   BILITOT 1.7*  --   --   --   --   --   --   GFRNONAA >60  --  >60 57*  --   --  38*  ANIONGAP 10  --  10 13  --   --  8   < > = values in this interval not displayed.     Hematology Recent Labs  Lab 09/27/20 0705 09/27/20 0739 09/28/20 0309 09/29/20 1548 09/29/20 1549 09/30/20 0119  WBC 6.7  --  7.1  --   --  5.3  RBC 3.57*  --  3.41*  --   --  3.24*  HGB 11.5*   < > 11.1* 9.9* 10.5* 10.4*  HCT 34.6*   < > 32.7* 29.0* 31.0* 31.6*  MCV 96.9  --  95.9  --   --  97.5  MCH 32.2  --  32.6  --   --  32.1  MCHC 33.2  --  33.9  --   --  32.9  RDW 13.4  --  13.5  --   --  13.3  PLT 270  --  254  --   --  241   < > = values in this interval not displayed.    BNP Recent Labs  Lab 09/27/20 0705  BNP 398.1*     DDimer No results for input(s): DDIMER in the last 168 hours.   Radiology    CARDIAC CATHETERIZATION  Result Date: 09/29/2020   Prox RCA lesion is 20% stenosed.   Mid RCA to Dist RCA lesion is 20% stenosed.   Ramus lesion is 30% stenosed.   Mid LAD lesion is 20% stenosed.   Dist LM to Prox LAD lesion is 20% stenosed. Mild non-obstructive CAD Severe aortic stenosis (mean gradient 52.8 mmHg, peak to peak gradient 58 mmHg, AVA 0.90  cm2). Recommendations: Will continue workup for TAVR   CT CORONARY MORPH W/CTA COR W/SCORE W/CA W/CM &/OR WO/CM  Addendum Date: 09/29/2020   ADDENDUM REPORT: 09/29/2020 13:14 CLINICAL DATA:  Pre-op transcatheter aortic valve replacement (TAVR) EXAM: Cardiac TAVR CT TECHNIQUE: The patient was scanned on a Siemens Force 192 slice scanner. A 120 kV retrospective scan was triggered in the descending thoracic aorta at 111 HU's. Gantry rotation speed was 270 msecs and collimation was .9 mm. The 3D data set was reconstructed in 5% intervals of the R-R cycle. Systolic and diastolic phases were analyzed on a dedicated work station using MPR, MIP and VRT modes. The patient received 88mL OMNIPAQUE IOHEXOL 350 MG/ML SOLN of contrast. FINDINGS: Aortic Valve: Tricuspid aortic valve. Severely reduced cusp separation. Severely thickened, moderately calcified aortic valve cusps. AV calcium score: 1049 Virtual Basal Annulus Measurements: Maximum/Minimum Diameter: 23.1 mm x 18.9 mm Perimeter: 65.4 mm Area: 328 mm2 No significant LVOT calcifications. Based on these measurements, the annulus would be suitable for a 20 mm Edwards Sapien 3 valve vs 23 mm Medtronic Evolut Pro valve. Sinus dimensions are borderline for 26 mm Medtronic Evolut Pro valve. Recommend Heart Team discussion for valve sizing. Sinus of Valsalva Measurements: Non-coronary:  26 mm Right - coronary:  26 mm Left -  coronary:  27 mm Sinus of Valsalva Height: Left: 17.5 mm Right: 18.6 mm Aorta: Mild aortic atherosclerosis. Sinotubular Junction: 27 mm Ascending Thoracic Aorta:  31 mm Aortic Arch:  24 mm Descending Thoracic Aorta:  22 mm Coronary Artery Height above Annulus: Left Main: 13 mm Right Coronary: 11.6 mm Coronary Arteries: Normal origins. 3 vessel coronary artery calcifications. Optimum Fluoroscopic Angle for Delivery: RAO 1, CRA 1 Moderate mitral annular calcification. No left atrial appendage thrombus. IMPRESSION: 1. Tricuspid aortic valve. Severely reduced  cusp separation. Severely thickened, moderately calcified aortic valve cusps. 2.  AV calcium score: 1049 3. Annulus area: 328 mm2, no significant LVOT calcifications. Based on these measurements, the annulus would be suitable for a 20 mm Edwards Sapien 3 valve vs 23 mm Medtronic Evolut Pro valve. Sinus dimensions are borderline for 26 mm Medtronic Evolut Pro valve. Recommend Heart Team discussion for valve sizing. 4.  Sufficient coronary artery height from annulus. 5.  Optimum Fluoroscopic Angle for Delivery: RAO 1, CRA 1 Electronically Signed   By: Weston BrassGayatri  Acharya M.D.   On: 09/29/2020 13:14   Result Date: 09/29/2020 EXAM: OVER-READ INTERPRETATION  CT CHEST The following report is an over-read performed by radiologist Dr. Trudie Reedaniel Entrikin of [facility] on 09/29/2020. This over-read does not include interpretation of cardiac or coronary anatomy or pathology. The coronary calcium score/coronary CTA interpretation by the cardiologist is attached. COMPARISON:  Cardiac CTA 10/09/2019. FINDINGS: Extracardiac findings will be described separately under dictation for contemporaneously obtained CTA chest, abdomen and pelvis. IMPRESSION: Please see separate dictation for contemporaneously obtained CTA chest, abdomen and pelvis dated 09/28/2020 for full description of relevant extracardiac findings. Electronically Signed: By: Trudie Reedaniel  Entrikin M.D. On: 09/29/2020 06:07   CT ANGIO CHEST AORTA W/CM & OR WO/CM  Result Date: 09/29/2020 CLINICAL DATA:  80 year old female with history of severe aortic stenosis. Preprocedural study prior to potential transcatheter aortic valve replacement (TAVR). EXAM: CT ANGIOGRAPHY CHEST, ABDOMEN AND PELVIS TECHNIQUE: Multidetector CT imaging through the chest, abdomen and pelvis was performed using the standard protocol during bolus administration of intravenous contrast. Multiplanar reconstructed images and MIPs were obtained and reviewed to evaluate the vascular anatomy.  CONTRAST:  95mL OMNIPAQUE IOHEXOL 350 MG/ML SOLN COMPARISON:  Cardiac CT 10/09/2019. FINDINGS: CTA CHEST FINDINGS Cardiovascular: Heart size is normal. There is no significant pericardial fluid, thickening or pericardial calcification. There is aortic atherosclerosis, as well as atherosclerosis of the great vessels of the mediastinum and the coronary arteries, including calcified atherosclerotic plaque in the left main, left anterior descending and right coronary arteries. Severe thickening calcification of the aortic valve. Severe calcifications of the mitral annulus. Mediastinum/Lymph Nodes: No pathologically enlarged mediastinal or hilar lymph nodes. Esophagus is unremarkable in appearance. No axillary lymphadenopathy. Lungs/Pleura: Trace bilateral pleural effusions lying dependently (right greater than left). Associated areas of passive subsegmental atelectasis in the lower lobes of the lungs bilaterally. No acute consolidative airspace disease. 5 mm pulmonary nodule in the lateral segment of the right middle lobe (axial image 61 of series 7). Musculoskeletal/Soft Tissues: There are no aggressive appearing lytic or blastic lesions noted in the visualized portions of the skeleton. CTA ABDOMEN AND PELVIS FINDINGS Hepatobiliary: No suspicious cystic or solid hepatic lesions. No intra or extrahepatic biliary ductal dilatation. Gallbladder is normal in appearance. Pancreas: No pancreatic mass. No pancreatic ductal dilatation. No pancreatic or peripancreatic fluid collections or inflammatory changes. Spleen: Unremarkable. Adrenals/Urinary Tract: Bilateral kidneys and adrenal glands are normal in appearance. No hydroureteronephrosis. Urinary bladder is normal in appearance.  Stomach/Bowel: The appearance of the stomach is normal. No pathologic dilatation of small bowel or colon. The appendix is not confidently identified and may be surgically absent. Regardless, there are no inflammatory changes noted adjacent to the  cecum to suggest the presence of an acute appendicitis at this time. Vascular/Lymphatic: Aortic atherosclerosis, without evidence of aneurysm or dissection in the abdominal or pelvic vasculature. Vascular findings and measurements pertinent to potential TAVR procedure, as detailed below. No lymphadenopathy noted in the abdomen or pelvis. Reproductive: Uterus and ovaries are trophic. Other: No significant volume of ascites.  No pneumoperitoneum. Musculoskeletal: There are no aggressive appearing lytic or blastic lesions noted in the visualized portions of the skeleton. VASCULAR MEASUREMENTS PERTINENT TO TAVR: AORTA: Minimal Aortic Diameter-14 x 15 mm Severity of Aortic Calcification-mild RIGHT PELVIS: Right Common Iliac Artery - Minimal Diameter-8.8 x 9.1 mm Tortuosity-mild Calcification-minimal Right External Iliac Artery - Minimal Diameter-6.9 x 6.5 mm Tortuosity-moderate Calcification-none Right Common Femoral Artery - Minimal Diameter-7.1 x 6.9 mm Tortuosity-mild Calcification-minimal LEFT PELVIS: Left Common Iliac Artery - Minimal Diameter-9.6 x 8.8 mm Tortuosity-mild Calcification-none Left External Iliac Artery - Minimal Diameter-6.6 x 7.0 mm Tortuosity-moderate Calcification-none Left Common Femoral Artery - Minimal Diameter-6.7 x 6.8 mm Tortuosity-mild Calcification-minimal Review of the MIP images confirms the above findings. IMPRESSION: 1. Vascular findings and measurements pertinent to potential TAVR procedure, as detailed above. 2. Severe thickening calcification of the aortic valve, compatible with reported clinical history of severe aortic stenosis. 3. 5 mm pulmonary nodule in the lateral segment of the right middle lobe. No follow-up needed if patient is low-risk. Non-contrast chest CT can be considered in 12 months if patient is high-risk. This recommendation follows the consensus statement: Guidelines for Management of Incidental Pulmonary Nodules Detected on CT Images: From the Fleischner Society  2017; Radiology 2017; 284:228-243. 4. Trace bilateral pleural effusions (right greater than left) with areas of passive subsegmental atelectasis in the lower lobes of the lungs bilaterally. 5. Mild cardiomegaly. 6. Aortic atherosclerosis, in addition to left main and 3 vessel coronary artery disease. 7. Severe calcifications of the mitral annulus. 8. Additional incidental findings, as above. Electronically Signed   By: Trudie Reed M.D.   On: 09/29/2020 10:05   CT Angio Abd/Pel w/ and/or w/o  Result Date: 09/29/2020 CLINICAL DATA:  80 year old female with history of severe aortic stenosis. Preprocedural study prior to potential transcatheter aortic valve replacement (TAVR). EXAM: CT ANGIOGRAPHY CHEST, ABDOMEN AND PELVIS TECHNIQUE: Multidetector CT imaging through the chest, abdomen and pelvis was performed using the standard protocol during bolus administration of intravenous contrast. Multiplanar reconstructed images and MIPs were obtained and reviewed to evaluate the vascular anatomy. CONTRAST:  52mL OMNIPAQUE IOHEXOL 350 MG/ML SOLN COMPARISON:  Cardiac CT 10/09/2019. FINDINGS: CTA CHEST FINDINGS Cardiovascular: Heart size is normal. There is no significant pericardial fluid, thickening or pericardial calcification. There is aortic atherosclerosis, as well as atherosclerosis of the great vessels of the mediastinum and the coronary arteries, including calcified atherosclerotic plaque in the left main, left anterior descending and right coronary arteries. Severe thickening calcification of the aortic valve. Severe calcifications of the mitral annulus. Mediastinum/Lymph Nodes: No pathologically enlarged mediastinal or hilar lymph nodes. Esophagus is unremarkable in appearance. No axillary lymphadenopathy. Lungs/Pleura: Trace bilateral pleural effusions lying dependently (right greater than left). Associated areas of passive subsegmental atelectasis in the lower lobes of the lungs bilaterally. No acute  consolidative airspace disease. 5 mm pulmonary nodule in the lateral segment of the right middle lobe (axial image 61 of series 7).  Musculoskeletal/Soft Tissues: There are no aggressive appearing lytic or blastic lesions noted in the visualized portions of the skeleton. CTA ABDOMEN AND PELVIS FINDINGS Hepatobiliary: No suspicious cystic or solid hepatic lesions. No intra or extrahepatic biliary ductal dilatation. Gallbladder is normal in appearance. Pancreas: No pancreatic mass. No pancreatic ductal dilatation. No pancreatic or peripancreatic fluid collections or inflammatory changes. Spleen: Unremarkable. Adrenals/Urinary Tract: Bilateral kidneys and adrenal glands are normal in appearance. No hydroureteronephrosis. Urinary bladder is normal in appearance. Stomach/Bowel: The appearance of the stomach is normal. No pathologic dilatation of small bowel or colon. The appendix is not confidently identified and may be surgically absent. Regardless, there are no inflammatory changes noted adjacent to the cecum to suggest the presence of an acute appendicitis at this time. Vascular/Lymphatic: Aortic atherosclerosis, without evidence of aneurysm or dissection in the abdominal or pelvic vasculature. Vascular findings and measurements pertinent to potential TAVR procedure, as detailed below. No lymphadenopathy noted in the abdomen or pelvis. Reproductive: Uterus and ovaries are trophic. Other: No significant volume of ascites.  No pneumoperitoneum. Musculoskeletal: There are no aggressive appearing lytic or blastic lesions noted in the visualized portions of the skeleton. VASCULAR MEASUREMENTS PERTINENT TO TAVR: AORTA: Minimal Aortic Diameter-14 x 15 mm Severity of Aortic Calcification-mild RIGHT PELVIS: Right Common Iliac Artery - Minimal Diameter-8.8 x 9.1 mm Tortuosity-mild Calcification-minimal Right External Iliac Artery - Minimal Diameter-6.9 x 6.5 mm Tortuosity-moderate Calcification-none Right Common Femoral Artery -  Minimal Diameter-7.1 x 6.9 mm Tortuosity-mild Calcification-minimal LEFT PELVIS: Left Common Iliac Artery - Minimal Diameter-9.6 x 8.8 mm Tortuosity-mild Calcification-none Left External Iliac Artery - Minimal Diameter-6.6 x 7.0 mm Tortuosity-moderate Calcification-none Left Common Femoral Artery - Minimal Diameter-6.7 x 6.8 mm Tortuosity-mild Calcification-minimal Review of the MIP images confirms the above findings. IMPRESSION: 1. Vascular findings and measurements pertinent to potential TAVR procedure, as detailed above. 2. Severe thickening calcification of the aortic valve, compatible with reported clinical history of severe aortic stenosis. 3. 5 mm pulmonary nodule in the lateral segment of the right middle lobe. No follow-up needed if patient is low-risk. Non-contrast chest CT can be considered in 12 months if patient is high-risk. This recommendation follows the consensus statement: Guidelines for Management of Incidental Pulmonary Nodules Detected on CT Images: From the Fleischner Society 2017; Radiology 2017; 284:228-243. 4. Trace bilateral pleural effusions (right greater than left) with areas of passive subsegmental atelectasis in the lower lobes of the lungs bilaterally. 5. Mild cardiomegaly. 6. Aortic atherosclerosis, in addition to left main and 3 vessel coronary artery disease. 7. Severe calcifications of the mitral annulus. 8. Additional incidental findings, as above. Electronically Signed   By: Trudie Reed M.D.   On: 09/29/2020 10:05    Cardiac Studies   Cath: 09/29/20  Prox RCA lesion is 20% stenosed.   Mid RCA to Dist RCA lesion is 20% stenosed.   Ramus lesion is 30% stenosed.   Mid LAD lesion is 20% stenosed.   Dist LM to Prox LAD lesion is 20% stenosed.   Mild non-obstructive CAD Severe aortic stenosis (mean gradient 52.8 mmHg, peak to peak gradient 58 mmHg, AVA 0.90 cm2).    Recommendations: Will continue workup for TAVR    Patient Profile     80 y.o. female hx of  moderate-severe aortic stenosis, HFpEF, non obstructive CAD, atrial fibrillation on eliquis, CVA, HTN, pulmonary HTN who was seen for the evaluation of acute heart failure exacerbation at the request of [facility].  Assessment & Plan    Acute on Chronic Diastolic  Heart Failure Exacerbation: History of non-ischemic HFpEF, recent echo 07/22 EF 55-60%. Responded to IV lasix 40 mg BID. Net - 3.1L, Cr did rise today. Lasix currently held. Reports breathing is stable, and she feels well.  -- continue carvedilol 6.25 BID   Hypertensive Urgency: improved -- continue coreg 6.25 BID. ARB held in the setting of AKI   Aortic Stenosis: Hx of moderate-severe aortic stenosis. Last echocardiogram 7/22 with aortic valve features of peak velocity 4.36 m/s, mean gradient 42 mm Hg, AV area 0.86 cm, dimensionless index 0.27. Underwent cardiac cath yesterday with no significant CAD. Severe AS with mean gradient 52.8 mmHg, peak to peak gradient 58 mmHg, AVA 0.90 cm2. -- Planned for continued outpatient TAVR work up with evaluation by [facility].  Atrial Fibrillation: developed afib RVR last evening and started on amiodarone gtt and converted to SR last evening. IV amiodarone was stopped as HR was in the 50s. Given her AS will start on amiodarone 400mg  daily for now in attempts to prevent further episodes of afib as I suspect she will easily developed recurrent CHF symptoms.  -- Eliquis has been resumed    Hx of non-occlusive CAD: mild nonobstructive disease on cath yesterday. Continued medical therapy. -- continue atorvastatin 40 mg daily and DM management   Hx of CVA: Patient admitted 07/22 with shower of strokes thought to be embolic in etiology 2/2 atrial fibrillation.    HLD: Continue atorvastatin 40 mg daily.    T2DM Last A1c of 7.4 07/22, medication regimen of metformin and glimepiride.  -- SSI, management per primary team   Incidental Pulmonary Nodules: Patient found to have 5 mm pulmonary  nodule on lateral segment of right middle lobe. -- outpatient follow up with PCP   For questions or updates, please contact [facility] Please consult [email or web address] for contact info under   Signed, Laverda Page, NP  09/30/2020, 10:07 AM    Patient seen, examined. Available data reviewed. Agree with findings, assessment, and plan as outlined by Laverda Page, NP.  The patient is independently interviewed and examined.  She is alert, oriented, no distress.  Lungs are clear, heart is regular rate and rhythm with a 3/6 harsh late peaking systolic crescendo decrescendo murmur at the right upper sternal border, abdomen is soft and nontender, extremities have no edema.  Right radial site is clear.  Cardiac catheterization confirmed severe aortic stenosis with a mean transvalvular gradient greater than 50 mmHg.  The patient has nonobstructive coronary artery disease.  Transient atrial fibrillation last night noted, converted with IV amiodarone.  Agree with use of oral amiodarone at least until she has been treated for TAVR.  Hopefully the use of amiodarone can be limited to about a 36-month period of time.  Patient now back on apixaban.  Repeat labs planned for tomorrow morning.  Would anticipate hospital discharge tomorrow as long as she remains clinically stable.  We will continue her TAVR evaluation as an outpatient.  Our nurse navigator has seen her this morning.  Tonny Bollman, M.D. 09/30/2020 11:32 AM

## 2020-10-01 ENCOUNTER — Ambulatory Visit: Payer: Medicare Other | Admitting: Cardiovascular Disease

## 2020-10-01 ENCOUNTER — Telehealth: Payer: Medicare Other

## 2020-10-01 ENCOUNTER — Other Ambulatory Visit (HOSPITAL_COMMUNITY): Payer: Self-pay

## 2020-10-01 LAB — BASIC METABOLIC PANEL
Anion gap: 7 (ref 5–15)
BUN: 25 mg/dL — ABNORMAL HIGH (ref 8–23)
CO2: 30 mmol/L (ref 22–32)
Calcium: 9.2 mg/dL (ref 8.9–10.3)
Chloride: 100 mmol/L (ref 98–111)
Creatinine, Ser: 1.1 mg/dL — ABNORMAL HIGH (ref 0.44–1.00)
GFR, Estimated: 50 mL/min — ABNORMAL LOW (ref >60)
Glucose, Bld: 141 mg/dL — ABNORMAL HIGH (ref 70–99)
Potassium: 4.4 mmol/L (ref 3.5–5.1)
Sodium: 137 mmol/L (ref 135–145)

## 2020-10-01 LAB — GLUCOSE, CAPILLARY: Glucose-Capillary: 121 mg/dL — ABNORMAL HIGH (ref 70–99)

## 2020-10-01 MED ORDER — ASPIRIN 81 MG PO TBEC
81.0000 mg | DELAYED_RELEASE_TABLET | Freq: Every day | ORAL | 11 refills | Status: DC
  Filled 2020-10-01: qty 30, 30d supply, fill #0

## 2020-10-01 MED ORDER — AMIODARONE HCL 200 MG PO TABS
400.0000 mg | ORAL_TABLET | Freq: Every day | ORAL | 0 refills | Status: DC
  Filled 2020-10-01: qty 30, 15d supply, fill #0

## 2020-10-01 NOTE — Discharge Summary (Signed)
Physician Discharge Summary  Mary Mccarthy AUQ:[id number] DOB: [birth year] DOA: 09/27/2020  PCP: Biagio Borg, MD  Admit date: 09/27/2020 Discharge date: 10/01/2020  Admitted From: home Disposition:  home  Recommendations for Outpatient Follow-up:  Follow up with cardiology in 1-2 weeks for possible TAVR Please obtain BMP/CBC in one week   Home Health:No Equipment/Devices:None  Discharge Condition:Stable CODE STATUS:Full Diet recommendation: Heart Healthy   Brief/Interim Summary:  80 y.o. female past medical history significant for CAD, essential hypertension atrial fibrillation on Eliquis morbid obesity comes into the ED complaining of chest tightness SARS-CoV-2 PCR is negative.  She was treated empirically with doxycycline by her PCP she felt volume overloaded develop a cough 2D echo was done that showed severe aortic stenosis on her previous hospitalization. Cardiology was consulted she was admitted for heart failure exacerbation started on IV diuresis.  Discharge Diagnoses:  Principal Problem:   Acute on chronic diastolic CHF (congestive heart failure) (HCC) Active Problems:   Type 2 diabetes mellitus with hyperglycemia, without long-term current use of insulin (HCC)   Hyperlipidemia with target LDL less than 70   Morbid obesity (HCC)   Severe aortic stenosis   Paroxysmal atrial fibrillation (HCC)  Acute on chronic diastolic heart failure: With a history of nonischemic cardiomyopathy with an EF of 60%, she was started on IV Lasix and she diuresed about 4 L. Cardiology was consulted cardiac cath showed nonobstructive coronary artery disease with severe stenosis with a mean gradient of 53. She will continue Coreg ARB and diuretics as an outpatient.  Severe aortic stenosis: Cardiology was consulted she had a CT of the chest Right and Left heart cath with nonobstructive disease. Cardiology will follow-up as an outpatient for aortic valve replacement.  Atrial fibrillation  with RVR: She was started on IV amiodarone converted back to sinus rhythm. She will continue Coreg amiodarone and Eliquis and outpatient.  Essential hypertension: Continue Coreg Avapro and diuretic as an outpatient.  Morbid obesity: Follow-up with PCP as an outpatient.  History of CVA: Continue Eliquis.  Discharge Instructions  Discharge Instructions     Diet - low sodium heart healthy   Complete by: As directed    Increase activity slowly   Complete by: As directed       Allergies as of 10/01/2020       Reactions   Ibuprofen Other (See Comments)   Bleeding events        Medication List     TAKE these medications    acetaminophen 500 MG tablet Commonly known as: TYLENOL Take 500 mg by mouth every 6 (six) hours as needed for moderate pain.   amiodarone 200 MG tablet Commonly known as: PACERONE Take 2 tablets (400 mg total) by mouth daily.   apixaban 5 MG Tabs tablet Commonly known as: ELIQUIS Take 1 tablet (5 mg total) by mouth 2 (two) times daily.   aspirin 81 MG EC tablet Take 1 tablet (81 mg total) by mouth daily. Swallow whole.   atorvastatin 40 MG tablet Commonly known as: LIPITOR TAKE 1 TABLET BY MOUTH EVERY DAY   benzonatate 200 MG capsule Commonly known as: TESSALON Take 1 capsule (200 mg total) by mouth 3 (three) times daily as needed for cough.   blood glucose meter kit and supplies Dispense based on patient and insurance preference. Use up to four times daily as directed. E11.9   carvedilol 6.25 MG tablet Commonly known as: COREG TAKE 1 TABLET BY MOUTH TWICE A DAY What changed: when to take this  cyanocobalamin 1000 MCG tablet Commonly known as: CVS VITAMIN B12 Take 1 tablet (1,000 mcg total) by mouth daily.   doxycycline 100 MG tablet Commonly known as: VIBRA-TABS Take 1 tablet (100 mg total) by mouth 2 (two) times daily.   glimepiride 2 MG tablet Commonly known as: AMARYL TAKE 1 TABLET BY MOUTH EVERY DAY BEFORE BREAKFAST What  changed: See the new instructions.   metFORMIN 1000 MG tablet Commonly known as: GLUCOPHAGE TAKE 1 TAB BY MOUTH IN THE AM,AND 1/2 TAB IN THE PM What changed:  how much to take how to take this when to take this additional instructions   ONE TOUCH ULTRA 2 w/Device Kit Use as directed daily   OneTouch Delica Plus [id number] Misc USE AS DIRECTED TEST 1-2 TIMES A DAY   OneTouch Ultra test strip Generic drug: glucose blood USE AS INSTRUCTED TEST 1-2 TIMES A DAY   pantoprazole 40 MG tablet Commonly known as: PROTONIX Take 1 tablet (40 mg total) by mouth daily.   Pfizer-BioNT COVID-19 Vac-TriS Susp injection Generic drug: COVID-19 mRNA Vac-TriS [employer])        Allergies  Allergen Reactions   Ibuprofen Other (See Comments)    Bleeding events    Consultations: Cardiology   Procedures/Studies: CARDIAC CATHETERIZATION  Result Date: 09/29/2020   Prox RCA lesion is 20% stenosed.   Mid RCA to Dist RCA lesion is 20% stenosed.   Ramus lesion is 30% stenosed.   Mid LAD lesion is 20% stenosed.   Dist LM to Prox LAD lesion is 20% stenosed. Mild non-obstructive CAD Severe aortic stenosis (mean gradient 52.8 mmHg, peak to peak gradient 58 mmHg, AVA 0.90 cm2). Recommendations: Will continue workup for TAVR   CT CORONARY MORPH W/CTA COR W/SCORE W/CA W/CM &/OR WO/CM  Addendum Date: 09/29/2020   ADDENDUM REPORT: 09/29/2020 13:14 CLINICAL DATA:  Pre-op transcatheter aortic valve replacement (TAVR) EXAM: Cardiac TAVR CT TECHNIQUE: The patient was scanned on a Siemens Force 740 slice scanner. A 120 kV retrospective scan was triggered in the descending thoracic aorta at 111 HU's. Gantry rotation speed was 270 msecs and collimation was .9 mm. The 3D data set was reconstructed in 5% intervals of the R-R cycle. Systolic and diastolic phases were analyzed on a dedicated work station using MPR, MIP and VRT modes. The patient received 34mL OMNIPAQUE IOHEXOL 350 MG/ML SOLN of contrast. FINDINGS: Aortic  Valve: Tricuspid aortic valve. Severely reduced cusp separation. Severely thickened, moderately calcified aortic valve cusps. AV calcium score: 1049 Virtual Basal Annulus Measurements: Maximum/Minimum Diameter: 23.1 mm x 18.9 mm Perimeter: 65.4 mm Area: 328 mm2 No significant LVOT calcifications. Based on these measurements, the annulus would be suitable for a 20 mm Edwards Sapien 3 valve vs 23 mm Medtronic Evolut Pro valve. Sinus dimensions are borderline for 26 mm Medtronic Evolut Pro valve. Recommend Heart Team discussion for valve sizing. Sinus of Valsalva Measurements: Non-coronary:  26 mm Right - coronary:  26 mm Left - coronary:  27 mm Sinus of Valsalva Height: Left: 17.5 mm Right: 18.6 mm Aorta: Mild aortic atherosclerosis. Sinotubular Junction: 27 mm Ascending Thoracic Aorta:  31 mm Aortic Arch:  24 mm Descending Thoracic Aorta:  22 mm Coronary Artery Height above Annulus: Left Main: 13 mm Right Coronary: 11.6 mm Coronary Arteries: Normal origins. 3 vessel coronary artery calcifications. Optimum Fluoroscopic Angle for Delivery: RAO 1, CRA 1 Moderate mitral annular calcification. No left atrial appendage thrombus. IMPRESSION: 1. Tricuspid aortic valve. Severely reduced cusp separation. Severely thickened, moderately calcified aortic valve cusps.  2.  AV calcium score: 1049 3. Annulus area: 328 mm2, no significant LVOT calcifications. Based on these measurements, the annulus would be suitable for a 20 mm Edwards Sapien 3 valve vs 23 mm Medtronic Evolut Pro valve. Sinus dimensions are borderline for 26 mm Medtronic Evolut Pro valve. Recommend Heart Team discussion for valve sizing. 4.  Sufficient coronary artery height from annulus. 5.  Optimum Fluoroscopic Angle for Delivery: RAO 1, CRA 1 Electronically Signed   By: Cherlynn Kaiser M.D.   On: 09/29/2020 13:14   Result Date: 09/29/2020 EXAM: OVER-READ INTERPRETATION  CT CHEST The following report is an over-read performed by radiologist Dr. Vinnie Langton  of [facility], [place] on 09/29/2020. This over-read does not include interpretation of cardiac or coronary anatomy or pathology. The coronary calcium score/coronary CTA interpretation by the cardiologist is attached. COMPARISON:  Cardiac CTA 10/09/2019. FINDINGS: Extracardiac findings will be described separately under dictation for contemporaneously obtained CTA chest, abdomen and pelvis. IMPRESSION: Please see separate dictation for contemporaneously obtained CTA chest, abdomen and pelvis dated 09/28/2020 for full description of relevant extracardiac findings. Electronically Signed: By: Vinnie Langton M.D. On: 09/29/2020 06:07   DG Chest Port 1 View  Result Date: 09/27/2020 CLINICAL DATA:  Shortness of breath for 2 days, history coronary artery disease post MI, hypertension, type II diabetes mellitus EXAM: PORTABLE CHEST 1 VIEW COMPARISON:  Portable exam 0741 hours compared to 08/10/2020 FINDINGS: Enlargement of cardiac silhouette with pulmonary vascular congestion. Mediastinal contours normal. Mild interstitial prominence in lower lungs, question due to hypoinflation or minimal edema. No pleural effusion, segmental consolidation, or pneumothorax. Bones demineralized. IMPRESSION: Enlargement of cardiac silhouette with pulmonary vascular congestion. Interstitial prominence in the lower lungs question due to hypoinflation or minimal pulmonary edema. Electronically Signed   By: Lavonia Dana M.D.   On: 09/27/2020 08:09   CT ANGIO CHEST AORTA W/CM & OR WO/CM  Result Date: 09/29/2020 CLINICAL DATA:  80 year old female with history of severe aortic stenosis. Preprocedural study prior to potential transcatheter aortic valve replacement (TAVR). EXAM: CT ANGIOGRAPHY CHEST, ABDOMEN AND PELVIS TECHNIQUE: Multidetector CT imaging through the chest, abdomen and pelvis was performed using the standard protocol during bolus administration of intravenous contrast. Multiplanar reconstructed images and MIPs were  obtained and reviewed to evaluate the vascular anatomy. CONTRAST:  37mL OMNIPAQUE IOHEXOL 350 MG/ML SOLN COMPARISON:  Cardiac CT 10/09/2019. FINDINGS: CTA CHEST FINDINGS Cardiovascular: Heart size is normal. There is no significant pericardial fluid, thickening or pericardial calcification. There is aortic atherosclerosis, as well as atherosclerosis of the great vessels of the mediastinum and the coronary arteries, including calcified atherosclerotic plaque in the left main, left anterior descending and right coronary arteries. Severe thickening calcification of the aortic valve. Severe calcifications of the mitral annulus. Mediastinum/Lymph Nodes: No pathologically enlarged mediastinal or hilar lymph nodes. Esophagus is unremarkable in appearance. No axillary lymphadenopathy. Lungs/Pleura: Trace bilateral pleural effusions lying dependently (right greater than left). Associated areas of passive subsegmental atelectasis in the lower lobes of the lungs bilaterally. No acute consolidative airspace disease. 5 mm pulmonary nodule in the lateral segment of the right middle lobe (axial image 61 of series 7). Musculoskeletal/Soft Tissues: There are no aggressive appearing lytic or blastic lesions noted in the visualized portions of the skeleton. CTA ABDOMEN AND PELVIS FINDINGS Hepatobiliary: No suspicious cystic or solid hepatic lesions. No intra or extrahepatic biliary ductal dilatation. Gallbladder is normal in appearance. Pancreas: No pancreatic mass. No pancreatic ductal dilatation. No pancreatic or peripancreatic fluid collections or inflammatory changes.  Spleen: Unremarkable. Adrenals/Urinary Tract: Bilateral kidneys and adrenal glands are normal in appearance. No hydroureteronephrosis. Urinary bladder is normal in appearance. Stomach/Bowel: The appearance of the stomach is normal. No pathologic dilatation of small bowel or colon. The appendix is not confidently identified and may be surgically absent. Regardless,  there are no inflammatory changes noted adjacent to the cecum to suggest the presence of an acute appendicitis at this time. Vascular/Lymphatic: Aortic atherosclerosis, without evidence of aneurysm or dissection in the abdominal or pelvic vasculature. Vascular findings and measurements pertinent to potential TAVR procedure, as detailed below. No lymphadenopathy noted in the abdomen or pelvis. Reproductive: Uterus and ovaries are trophic. Other: No significant volume of ascites.  No pneumoperitoneum. Musculoskeletal: There are no aggressive appearing lytic or blastic lesions noted in the visualized portions of the skeleton. VASCULAR MEASUREMENTS PERTINENT TO TAVR: AORTA: Minimal Aortic Diameter-14 x 15 mm Severity of Aortic Calcification-mild RIGHT PELVIS: Right Common Iliac Artery - Minimal Diameter-8.8 x 9.1 mm Tortuosity-mild Calcification-minimal Right External Iliac Artery - Minimal Diameter-6.9 x 6.5 mm Tortuosity-moderate Calcification-none Right Common Femoral Artery - Minimal Diameter-7.1 x 6.9 mm Tortuosity-mild Calcification-minimal LEFT PELVIS: Left Common Iliac Artery - Minimal Diameter-9.6 x 8.8 mm Tortuosity-mild Calcification-none Left External Iliac Artery - Minimal Diameter-6.6 x 7.0 mm Tortuosity-moderate Calcification-none Left Common Femoral Artery - Minimal Diameter-6.7 x 6.8 mm Tortuosity-mild Calcification-minimal Review of the MIP images confirms the above findings. IMPRESSION: 1. Vascular findings and measurements pertinent to potential TAVR procedure, as detailed above. 2. Severe thickening calcification of the aortic valve, compatible with reported clinical history of severe aortic stenosis. 3. 5 mm pulmonary nodule in the lateral segment of the right middle lobe. No follow-up needed if patient is low-risk. Non-contrast chest CT can be considered in 12 months if patient is high-risk. This recommendation follows the consensus statement: Guidelines for Management of Incidental Pulmonary  Nodules Detected on CT Images: From the Fleischner Society 2017; Radiology 2017; 284:228-243. 4. Trace bilateral pleural effusions (right greater than left) with areas of passive subsegmental atelectasis in the lower lobes of the lungs bilaterally. 5. Mild cardiomegaly. 6. Aortic atherosclerosis, in addition to left main and 3 vessel coronary artery disease. 7. Severe calcifications of the mitral annulus. 8. Additional incidental findings, as above. Electronically Signed   By: Vinnie Langton M.D.   On: 09/29/2020 10:05   CT Angio Abd/Pel w/ and/or w/o  Result Date: 09/29/2020 CLINICAL DATA:  80 year old female with history of severe aortic stenosis. Preprocedural study prior to potential transcatheter aortic valve replacement (TAVR). EXAM: CT ANGIOGRAPHY CHEST, ABDOMEN AND PELVIS TECHNIQUE: Multidetector CT imaging through the chest, abdomen and pelvis was performed using the standard protocol during bolus administration of intravenous contrast. Multiplanar reconstructed images and MIPs were obtained and reviewed to evaluate the vascular anatomy. CONTRAST:  87mL OMNIPAQUE IOHEXOL 350 MG/ML SOLN COMPARISON:  Cardiac CT 10/09/2019. FINDINGS: CTA CHEST FINDINGS Cardiovascular: Heart size is normal. There is no significant pericardial fluid, thickening or pericardial calcification. There is aortic atherosclerosis, as well as atherosclerosis of the great vessels of the mediastinum and the coronary arteries, including calcified atherosclerotic plaque in the left main, left anterior descending and right coronary arteries. Severe thickening calcification of the aortic valve. Severe calcifications of the mitral annulus. Mediastinum/Lymph Nodes: No pathologically enlarged mediastinal or hilar lymph nodes. Esophagus is unremarkable in appearance. No axillary lymphadenopathy. Lungs/Pleura: Trace bilateral pleural effusions lying dependently (right greater than left). Associated areas of passive subsegmental atelectasis in  the lower lobes of the lungs bilaterally. No acute consolidative  airspace disease. 5 mm pulmonary nodule in the lateral segment of the right middle lobe (axial image 61 of series 7). Musculoskeletal/Soft Tissues: There are no aggressive appearing lytic or blastic lesions noted in the visualized portions of the skeleton. CTA ABDOMEN AND PELVIS FINDINGS Hepatobiliary: No suspicious cystic or solid hepatic lesions. No intra or extrahepatic biliary ductal dilatation. Gallbladder is normal in appearance. Pancreas: No pancreatic mass. No pancreatic ductal dilatation. No pancreatic or peripancreatic fluid collections or inflammatory changes. Spleen: Unremarkable. Adrenals/Urinary Tract: Bilateral kidneys and adrenal glands are normal in appearance. No hydroureteronephrosis. Urinary bladder is normal in appearance. Stomach/Bowel: The appearance of the stomach is normal. No pathologic dilatation of small bowel or colon. The appendix is not confidently identified and may be surgically absent. Regardless, there are no inflammatory changes noted adjacent to the cecum to suggest the presence of an acute appendicitis at this time. Vascular/Lymphatic: Aortic atherosclerosis, without evidence of aneurysm or dissection in the abdominal or pelvic vasculature. Vascular findings and measurements pertinent to potential TAVR procedure, as detailed below. No lymphadenopathy noted in the abdomen or pelvis. Reproductive: Uterus and ovaries are trophic. Other: No significant volume of ascites.  No pneumoperitoneum. Musculoskeletal: There are no aggressive appearing lytic or blastic lesions noted in the visualized portions of the skeleton. VASCULAR MEASUREMENTS PERTINENT TO TAVR: AORTA: Minimal Aortic Diameter-14 x 15 mm Severity of Aortic Calcification-mild RIGHT PELVIS: Right Common Iliac Artery - Minimal Diameter-8.8 x 9.1 mm Tortuosity-mild Calcification-minimal Right External Iliac Artery - Minimal Diameter-6.9 x 6.5 mm  Tortuosity-moderate Calcification-none Right Common Femoral Artery - Minimal Diameter-7.1 x 6.9 mm Tortuosity-mild Calcification-minimal LEFT PELVIS: Left Common Iliac Artery - Minimal Diameter-9.6 x 8.8 mm Tortuosity-mild Calcification-none Left External Iliac Artery - Minimal Diameter-6.6 x 7.0 mm Tortuosity-moderate Calcification-none Left Common Femoral Artery - Minimal Diameter-6.7 x 6.8 mm Tortuosity-mild Calcification-minimal Review of the MIP images confirms the above findings. IMPRESSION: 1. Vascular findings and measurements pertinent to potential TAVR procedure, as detailed above. 2. Severe thickening calcification of the aortic valve, compatible with reported clinical history of severe aortic stenosis. 3. 5 mm pulmonary nodule in the lateral segment of the right middle lobe. No follow-up needed if patient is low-risk. Non-contrast chest CT can be considered in 12 months if patient is high-risk. This recommendation follows the consensus statement: Guidelines for Management of Incidental Pulmonary Nodules Detected on CT Images: From the Fleischner Society 2017; Radiology 2017; 284:228-243. 4. Trace bilateral pleural effusions (right greater than left) with areas of passive subsegmental atelectasis in the lower lobes of the lungs bilaterally. 5. Mild cardiomegaly. 6. Aortic atherosclerosis, in addition to left main and 3 vessel coronary artery disease. 7. Severe calcifications of the mitral annulus. 8. Additional incidental findings, as above. Electronically Signed   By: Vinnie Langton M.D.   On: 09/29/2020 10:05   (Echo, Carotid, EGD, Colonoscopy, ERCP)    Subjective: No complaints  Discharge Exam: Vitals:   10/01/20 0357 10/01/20 0423  BP:  (!) 160/54  Pulse:  (!) 59  Resp:  19  Temp:  97.6 F (36.4 C)  SpO2: 95% 100%   Vitals:   10/01/20 0115 10/01/20 0119 10/01/20 0357 10/01/20 0423  BP:    (!) 160/54  Pulse:    (!) 59  Resp:    19  Temp:    97.6 F (36.4 C)  TempSrc:    Oral   SpO2: (!) 64% 99% 95% 100%  Weight:    91.1 kg  Height:        General:  Pt is alert, awake, not in acute distress Cardiovascular: RRR, S1/S2 +, no rubs, no gallops Respiratory: CTA bilaterally, no wheezing, no rhonchi Abdominal: Soft, NT, ND, bowel sounds + Extremities: no edema, no cyanosis    The results of significant diagnostics from this hospitalization (including imaging, microbiology, ancillary and laboratory) are listed below for reference.     Microbiology: Recent Results (from the past 240 hour(s))  Resp Panel by RT-PCR (Flu A&B, Covid) Nasopharyngeal Swab     Status: None   Collection Time: 09/27/20  6:40 AM   Specimen: Nasopharyngeal Swab; Nasopharyngeal(NP) swabs in vial transport medium  Result Value Ref Range Status   SARS Coronavirus 2 by RT PCR NEGATIVE NEGATIVE Final    Comment: (NOTE) SARS-CoV-2 target nucleic acids are NOT DETECTED.  The SARS-CoV-2 RNA is generally detectable in upper respiratory specimens during the acute phase of infection. The lowest concentration of SARS-CoV-2 viral copies this assay can detect is 138 copies/mL. A negative result does not preclude SARS-Cov-2 infection and should not be used as the sole basis for treatment or other patient management decisions. A negative result may occur with  improper specimen collection/handling, submission of specimen other than nasopharyngeal swab, presence of viral mutation(s) within the areas targeted by this assay, and inadequate number of viral copies(<138 copies/mL). A negative result must be combined with clinical observations, patient history, and epidemiological information. The expected result is Negative.  Fact Sheet for Patients:  EntrepreneurPulse.com.au  Fact Sheet for Healthcare Providers:  IncredibleEmployment.be  This test is no t yet approved or cleared by the [place] FDA and  has been authorized for detection and/or diagnosis of  SARS-CoV-2 by FDA under an Emergency Use Authorization (EUA). This EUA will remain  in effect (meaning this test can be used) for the duration of the COVID-19 declaration under Section 564(b)(1) of the Act, 21 U.S.C.section 360bbb-3(b)(1), unless the authorization is terminated  or revoked sooner.       Influenza A by PCR NEGATIVE NEGATIVE Final   Influenza B by PCR NEGATIVE NEGATIVE Final    Comment: (NOTE) The Xpert Xpress SARS-CoV-2/FLU/RSV plus assay is intended as an aid in the diagnosis of influenza from Nasopharyngeal swab specimens and should not be used as a sole basis for treatment. Nasal washings and aspirates are unacceptable for Xpert Xpress SARS-CoV-2/FLU/RSV testing.  Fact Sheet for Patients: EntrepreneurPulse.com.au  Fact Sheet for Healthcare Providers: IncredibleEmployment.be  This test is not yet approved or cleared by the [place] FDA and has been authorized for detection and/or diagnosis of SARS-CoV-2 by FDA under an Emergency Use Authorization (EUA). This EUA will remain in effect (meaning this test can be used) for the duration of the COVID-19 declaration under Section 564(b)(1) of the Act, 21 U.S.C. section 360bbb-3(b)(1), unless the authorization is terminated or revoked.  Performed at [facility], [place] [address] [place], [place] [address]      Labs: BNP (last 3 results) Recent Labs    09/27/20 0705  BNP 629.5*   Basic Metabolic Panel: Recent Labs  Lab 09/27/20 0705 09/27/20 0739 09/28/20 0309 09/29/20 0440 09/29/20 1548 09/29/20 1549 09/30/20 0119  NA 140 143 138 139 141 141 136  K 3.4* 3.5 3.3* 3.9 3.7 3.8 3.9  CL 105 105 98 95*  --   --  98  CO2 25  --  30 31  --   --  30  GLUCOSE 109* 106* 127* 109*  --   --  144*  BUN 15 18  15 17  --   --  21  CREATININE 0.80 0.70 0.90 0.99  --   --  1.38*  CALCIUM 9.2  --  8.9 9.6  --   --  8.4*  MG  --   --  0.8* 2.5*  --   --  2.0   Liver  Function Tests: Recent Labs  Lab 09/27/20 0705  AST 21  ALT 16  ALKPHOS 46  BILITOT 1.7*  PROT 6.8  ALBUMIN 3.3*   No results for input(s): LIPASE, AMYLASE in the last 168 hours. No results for input(s): AMMONIA in the last 168 hours. CBC: Recent Labs  Lab 09/27/20 0705 09/27/20 0739 09/28/20 0309 09/29/20 1548 09/29/20 1549 09/30/20 0119  WBC 6.7  --  7.1  --   --  5.3  NEUTROABS 4.5  --   --   --   --   --   HGB 11.5* 11.2* 11.1* 9.9* 10.5* 10.4*  HCT 34.6* 33.0* 32.7* 29.0* 31.0* 31.6*  MCV 96.9  --  95.9  --   --  97.5  PLT 270  --  254  --   --  241   Cardiac Enzymes: No results for input(s): CKTOTAL, CKMB, CKMBINDEX, TROPONINI in the last 168 hours. BNP: Invalid input(s): POCBNP CBG: Recent Labs  Lab 09/30/20 0613 09/30/20 1149 09/30/20 1618 09/30/20 2021 10/01/20 0528  GLUCAP 118* 157* 119* 160* 121*   D-Dimer No results for input(s): DDIMER in the last 72 hours. Hgb A1c No results for input(s): HGBA1C in the last 72 hours. Lipid Profile No results for input(s): CHOL, HDL, LDLCALC, TRIG, CHOLHDL, LDLDIRECT in the last 72 hours. Thyroid function studies No results for input(s): TSH, T4TOTAL, T3FREE, THYROIDAB in the last 72 hours.  Invalid input(s): FREET3 Anemia work up No results for input(s): VITAMINB12, FOLATE, FERRITIN, TIBC, IRON, RETICCTPCT in the last 72 hours. Urinalysis    Component Value Date/Time   COLORURINE YELLOW 08/10/2020 0433   APPEARANCEUR CLEAR 08/10/2020 0433   LABSPEC 1.013 08/10/2020 0433   PHURINE 5.0 08/10/2020 0433   GLUCOSEU >=500 (A) 08/10/2020 0433   GLUCOSEU NEGATIVE 03/18/2020 1125   HGBUR NEGATIVE 08/10/2020 0433   BILIRUBINUR NEGATIVE 08/10/2020 0433   KETONESUR NEGATIVE 08/10/2020 0433   PROTEINUR NEGATIVE 08/10/2020 0433   UROBILINOGEN 1.0 03/18/2020 1125   NITRITE NEGATIVE 08/10/2020 0433   LEUKOCYTESUR NEGATIVE 08/10/2020 0433   Sepsis Labs Invalid input(s): PROCALCITONIN,  WBC,   LACTICIDVEN Microbiology Recent Results (from the past 240 hour(s))  Resp Panel by RT-PCR (Flu A&B, Covid) Nasopharyngeal Swab     Status: None   Collection Time: 09/27/20  6:40 AM   Specimen: Nasopharyngeal Swab; Nasopharyngeal(NP) swabs in vial transport medium  Result Value Ref Range Status   SARS Coronavirus 2 by RT PCR NEGATIVE NEGATIVE Final    Comment: (NOTE) SARS-CoV-2 target nucleic acids are NOT DETECTED.  The SARS-CoV-2 RNA is generally detectable in upper respiratory specimens during the acute phase of infection. The lowest concentration of SARS-CoV-2 viral copies this assay can detect is 138 copies/mL. A negative result does not preclude SARS-Cov-2 infection and should not be used as the sole basis for treatment or other patient management decisions. A negative result may occur with  improper specimen collection/handling, submission of specimen other than nasopharyngeal swab, presence of viral mutation(s) within the areas targeted by this assay, and inadequate number of viral copies(<138 copies/mL). A negative result must be combined with clinical observations, patient history, and epidemiological information. The expected result  is Negative.  Fact Sheet for Patients:  EntrepreneurPulse.com.au  Fact Sheet for Healthcare Providers:  IncredibleEmployment.be  This test is no t yet approved or cleared by the [place] FDA and  has been authorized for detection and/or diagnosis of SARS-CoV-2 by FDA under an Emergency Use Authorization (EUA). This EUA will remain  in effect (meaning this test can be used) for the duration of the COVID-19 declaration under Section 564(b)(1) of the Act, 21 U.S.C.section 360bbb-3(b)(1), unless the authorization is terminated  or revoked sooner.       Influenza A by PCR NEGATIVE NEGATIVE Final   Influenza B by PCR NEGATIVE NEGATIVE Final    Comment: (NOTE) The Xpert Xpress SARS-CoV-2/FLU/RSV plus  assay is intended as an aid in the diagnosis of influenza from Nasopharyngeal swab specimens and should not be used as a sole basis for treatment. Nasal washings and aspirates are unacceptable for Xpert Xpress SARS-CoV-2/FLU/RSV testing.  Fact Sheet for Patients: EntrepreneurPulse.com.au  Fact Sheet for Healthcare Providers: IncredibleEmployment.be  This test is not yet approved or cleared by the [place] FDA and has been authorized for detection and/or diagnosis of SARS-CoV-2 by FDA under an Emergency Use Authorization (EUA). This EUA will remain in effect (meaning this test can be used) for the duration of the COVID-19 declaration under Section 564(b)(1) of the Act, 21 U.S.C. section 360bbb-3(b)(1), unless the authorization is terminated or revoked.  Performed at [facility], [place] [address], [place], [place] [address]      Time coordinating discharge: Over 30 minutes  SIGNED:   Charlynne Cousins, MD  [facility] 10/01/2020, 7:18 AM Pager   If 7PM-7AM, please contact night-coverage [email or web address] Password [email or web address]

## 2020-10-01 NOTE — Progress Notes (Signed)
Patient given discharge instructions and stated understanding.  Waiting on POC medications

## 2020-10-01 NOTE — Progress Notes (Addendum)
Progress Note  Patient Name: Mary Mccarthy Date of Encounter: 10/01/2020  [facility] Cardiologist: Bryan Lemma, MD   Subjective   Feeling well, planned for discharge today.   Inpatient Medications    Scheduled Meds:  ALPRAZolam  0.25 mg Oral Once   amiodarone  400 mg Oral Daily   amLODipine  5 mg Oral Daily   apixaban  5 mg Oral BID   aspirin EC  81 mg Oral Daily   atorvastatin  40 mg Oral Daily   carvedilol  6.25 mg Oral BID WC   docusate sodium  100 mg Oral BID   insulin aspart  0-15 Units Subcutaneous TID WC   pantoprazole  40 mg Oral Daily   potassium chloride  40 mEq Oral Once   sodium chloride flush  3 mL Intravenous Q12H   Continuous Infusions:  sodium chloride     PRN Meds: sodium chloride, acetaminophen **OR** acetaminophen, benzonatate, bisacodyl, morphine injection, ondansetron **OR** ondansetron (ZOFRAN) IV, oxyCODONE, polyethylene glycol, sodium chloride flush, traZODone   Vital Signs    Vitals:   10/01/20 0119 10/01/20 0357 10/01/20 0423 10/01/20 0756  BP:   (!) 160/54   Pulse:   (!) 59   Resp:   19 20  Temp:   97.6 F (36.4 C)   TempSrc:   Oral   SpO2: 99% 95% 100% 96%  Weight:   91.1 kg   Height:        Intake/Output Summary (Last 24 hours) at 10/01/2020 0942 Last data filed at 10/01/2020 9892 Gross per 24 hour  Intake 1083.93 ml  Output 1100 ml  Net -16.07 ml   Last 3 Weights 10/01/2020 09/30/2020 09/29/2020  Weight (lbs) 200 lb 12.8 oz 200 lb 1.6 oz 198 lb 9.6 oz  Weight (kg) 91.082 kg 90.765 kg 90.084 kg      Telemetry    SR - Personally Reviewed  ECG    No new tracing  Physical Exam   GEN: No acute distress.   Neck: No JVD Cardiac: RRR, no murmurs, rubs, or gallops.  Respiratory: Clear to auscultation bilaterally. GI: Soft, nontender, non-distended  MS: No edema; No deformity. Neuro:  Nonfocal  Psych: Normal affect   Labs    High Sensitivity Troponin:   Recent Labs  Lab 09/27/20 0705 09/27/20 0935   TROPONINIHS 77* 74*      Chemistry Recent Labs  Lab 09/27/20 0705 09/27/20 0739 09/29/20 0440 09/29/20 1548 09/29/20 1549 09/30/20 0119 10/01/20 0740  NA 140   < > 139   < > 141 136 137  K 3.4*   < > 3.9   < > 3.8 3.9 4.4  CL 105   < > 95*  --   --  98 100  CO2 25   < > 31  --   --  30 30  GLUCOSE 109*   < > 109*  --   --  144* 141*  BUN 15   < > 17  --   --  21 25*  CREATININE 0.80   < > 0.99  --   --  1.38* 1.10*  CALCIUM 9.2   < > 9.6  --   --  8.4* 9.2  PROT 6.8  --   --   --   --   --   --   ALBUMIN 3.3*  --   --   --   --   --   --   AST 21  --   --   --   --   --   --  ALT 16  --   --   --   --   --   --   ALKPHOS 46  --   --   --   --   --   --   BILITOT 1.7*  --   --   --   --   --   --   GFRNONAA >60   < > 57*  --   --  38* 50*  ANIONGAP 10   < > 13  --   --  8 7   < > = values in this interval not displayed.     Hematology Recent Labs  Lab 09/27/20 0705 09/27/20 0739 09/28/20 0309 09/29/20 1548 09/29/20 1549 09/30/20 0119  WBC 6.7  --  7.1  --   --  5.3  RBC 3.57*  --  3.41*  --   --  3.24*  HGB 11.5*   < > 11.1* 9.9* 10.5* 10.4*  HCT 34.6*   < > 32.7* 29.0* 31.0* 31.6*  MCV 96.9  --  95.9  --   --  97.5  MCH 32.2  --  32.6  --   --  32.1  MCHC 33.2  --  33.9  --   --  32.9  RDW 13.4  --  13.5  --   --  13.3  PLT 270  --  254  --   --  241   < > = values in this interval not displayed.    BNP Recent Labs  Lab 09/27/20 0705  BNP 398.1*     DDimer No results for input(s): DDIMER in the last 168 hours.   Radiology    CARDIAC CATHETERIZATION  Result Date: 09/29/2020   Prox RCA lesion is 20% stenosed.   Mid RCA to Dist RCA lesion is 20% stenosed.   Ramus lesion is 30% stenosed.   Mid LAD lesion is 20% stenosed.   Dist LM to Prox LAD lesion is 20% stenosed. Mild non-obstructive CAD Severe aortic stenosis (mean gradient 52.8 mmHg, peak to peak gradient 58 mmHg, AVA 0.90 cm2). Recommendations: Will continue workup for TAVR    Cardiac Studies    Cath: 09/29/20   Prox RCA lesion is 20% stenosed.   Mid RCA to Dist RCA lesion is 20% stenosed.   Ramus lesion is 30% stenosed.   Mid LAD lesion is 20% stenosed.   Dist LM to Prox LAD lesion is 20% stenosed.   Mild non-obstructive CAD Severe aortic stenosis (mean gradient 52.8 mmHg, peak to peak gradient 58 mmHg, AVA 0.90 cm2).    Recommendations: Will continue workup for TAVR  Patient Profile     80 y.o. female  hx of moderate-severe aortic stenosis, HFpEF, non obstructive CAD, atrial fibrillation on eliquis, CVA, HTN, pulmonary HTN who was seen for the evaluation of acute heart failure exacerbation at the request of [facility].  Assessment & Plan    Acute on Chronic Diastolic Heart Failure Exacerbation: History of non-ischemic HFpEF, recent echo 07/22 EF 55-60%. Responded to IV lasix 40 mg BID. Net - 3.1L, Cr did rise, lasix was then held, Cr stabilized.  -- continue carvedilol 6.25 BID   Hypertensive Urgency: improved -- continue coreg 6.25 BID. ARB held in the setting of AKI   Aortic Stenosis: Hx of moderate-severe aortic stenosis. Last echocardiogram 7/22 with aortic valve features of peak velocity 4.36 m/s, mean gradient 42 mm Hg, AV area 0.86 cm, dimensionless index 0.27. Underwent cardiac cath yesterday  with no significant CAD. Severe AS with mean gradient 52.8 mmHg, peak to peak gradient 58 mmHg, AVA 0.90 cm2. -- Planned for continued outpatient TAVR work up with evaluation by [facility].   Atrial Fibrillation: developed afib RVR and started on amiodarone gtt and converted to SR. Transitioned to oral amiodarone 400mg  daily to be continued until at least time of TAVR. -- Eliquis has been resumed    Hx of non-occlusive CAD: mild nonobstructive disease on cath. Continued medical therapy. -- continue atorvastatin 40 mg daily and DM management   Hx of CVA: Patient admitted 07/22 with shower of strokes thought to be embolic in etiology 2/2 atrial fibrillation.     HLD: Continue atorvastatin 40 mg daily.    T2DM Last A1c of 7.4 07/22, medication regimen of metformin and glimepiride.  -- SSI, management per primary team   Incidental Pulmonary Nodules: Patient found to have 5 mm pulmonary nodule on lateral segment of right middle lobe. -- outpatient follow up with PCP   For questions or updates, please contact [facility] Please consult [email or web address] for contact info under     Signed, 8/22, NP  10/01/2020, 9:42 AM    Pt discharged prior to seeing her. Agree with documentation above. Continued outpatient TAVR evaluation.   10/03/2020 10/01/2020 10:48 AM

## 2020-10-01 NOTE — Care Management Important Message (Signed)
Important Message  Patient Details  Name: Mary Mccarthy MRN: [id number] Date of Birth: September 12, 1940   Medicare Important Message Given:  Yes     Renie Ora 10/01/2020, 8:30 AM

## 2020-10-01 NOTE — Progress Notes (Signed)
Occupational Therapy Treatment Patient Details Name: Mary Mccarthy MRN: [id number] DOB: 1940/09/03 Today's Date: 10/01/2020    History of present illness Pt is an 80 y.o. female who presented 09/27/20 with SOB and chest tightness. Pt admitted with acute on chronic diastolic CHF, complicated by severe AS. S/P cardiac cath 8/24. PMH includes CVA (08/2020), CAD, HTN, HLD, afib on Eliquis, obesity, DM2, anemia, MI, aortic stenosis, osteoporosis.   OT comments  Progressing well, completing transfers and in room mobility with supervision. Toileting with modified independnece, LB dressing with supervision.  Reports son looking into purchasing a shower chair that will fit in her shower. Eager for [employer] home.  Will have initial 24/7 support and updated dc plan to no OT follow up.    Follow Up Recommendations  No OT follow up;Supervision/Assistance - 24 hour (initally)    Equipment Recommendations  None recommended by OT    Recommendations for Other Services      Precautions / Restrictions Precautions Precautions: Fall;Other (comment) Precaution Comments: Recent CVA (08/2020) with residual L-side weakness Restrictions Weight Bearing Restrictions: No       Mobility Bed Mobility Overal bed mobility: Modified Independent                  Transfers Overall transfer level: Needs assistance Equipment used: None Transfers: Sit to/from Stand Sit to Stand: Supervision         General transfer comment: for safety    Balance Overall balance assessment: Needs assistance Sitting-balance support: No upper extremity supported;Feet supported Sitting balance-[name]: Good     Standing balance support: No upper extremity supported;During functional activity Standing balance-[name]: Fair Standing balance comment: close supervision dynamically                           ADL either performed or assessed with clinical judgement   ADL Overall ADL's : Needs  assistance/impaired     Grooming: Supervision/safety;Standing               Lower Body Dressing: Supervision/safety;Sit to/from stand   Toilet Transfer: Supervision/safety;Ambulation   Toileting- Clothing Manipulation and Hygiene: Modified independent;Sit to/from stand       Functional mobility during ADLs: Supervision/safety General ADL Comments: mobility without AD supervision, plans to have assist at home initally 24/7     Vision   Vision Assessment?: No apparent visual deficits   Perception     Praxis      Cognition Arousal/Alertness: Awake/alert Behavior During Therapy: WFL for tasks assessed/performed Overall Cognitive Status: Within Functional Limits for tasks assessed                                          Exercises     Shoulder Instructions       General Comments pt reports will have family support initally, plans to get [facility] for shower and will have family around when taking a shower    Pertinent Vitals/ Pain       Pain Assessment: No/denies pain  Home Living                                          Prior Functioning/Environment              Frequency  Min  2X/week        Progress Toward Goals  OT Goals(current goals can now be found in the care plan section)  Progress towards OT goals: Progressing toward goals  Acute Rehab OT Goals Patient Stated Goal: to get home OT Goal Formulation: With patient  Plan Frequency remains appropriate;Discharge plan needs to be updated    Co-evaluation                 AM-PAC OT "6 Clicks" Daily Activity     Outcome Measure   Help from another person eating meals?: None Help from another person taking care of personal grooming?: A Little Help from another person toileting, which includes using toliet, bedpan, or urinal?: A Little Help from another person bathing (including washing, rinsing, drying)?: A Little Help from another person to put on and  taking off regular upper body clothing?: A Little Help from another person to put on and taking off regular lower body clothing?: A Little 6 Click Score: 19    End of Session    OT Visit Diagnosis: Other abnormalities of gait and mobility (R26.89);Muscle weakness (generalized) (M62.81)   Activity Tolerance Patient tolerated treatment well   Patient Left in chair;with call bell/phone within reach   Nurse Communication Mobility status;Other (comment)        Time: 3748-2707 OT Time Calculation (min): 20 min  Charges: OT General Charges $OT Visit: 1 Visit OT Treatments $Self Care/Home Management : 8-22 mins  Barry Brunner, OT Acute Rehabilitation Services Pager [phone] Office [phone]    Chancy Milroy 10/01/2020, 9:18 AM

## 2020-10-01 NOTE — Progress Notes (Signed)
Physical Therapy Treatment Patient Details Name: Mary Mccarthy MRN: [id number] DOB: [birth year] Today's Date: 10/01/2020    History of Present Illness Pt is an 80 y.o. female who presented 09/27/20 with SOB and chest tightness. Pt admitted with acute on chronic diastolic CHF, complicated by severe AS. S/P cardiac cath 8/24. PMH includes CVA (08/2020), CAD, HTN, HLD, afib on Eliquis, obesity, DM2, anemia, MI, aortic stenosis, osteoporosis.   PT Comments    Pt progressing well with mobility, preparing for d/c home. Pt's stability much improved when she is able to focus on slowing gait speed. Reviewed educ re: activity recommendations, fall risk reduction. Pt reports no further questions or concerns.    Follow Up Recommendations  Home health PT;Supervision - Intermittent     Equipment Recommendations  None recommended by PT    Recommendations for Other Services       Precautions / Restrictions Precautions Precautions: Fall;Other (comment) Precaution Comments: Recent CVA (08/2020) with residual L-side weakness Restrictions Weight Bearing Restrictions: No    Mobility  Bed Mobility Overal bed mobility: Modified Independent             General bed mobility comments: Received sitting in recliner    Transfers Overall transfer level: Modified independent Equipment used: None Transfers: Sit to/from Stand Sit to Stand: Supervision         General transfer comment: Multiple sit<>stand from recliner and transport chair, mod indep without assist, increased time  Ambulation/Gait Ambulation/Gait assistance: Supervision Gait Distance (Feet): 60 Feet Assistive device: None Gait Pattern/deviations: Step-through pattern;Decreased stride length;Shuffle;Trunk flexed     General Gait Details: Gait stability improved when pt slows gait speed, much better able to maintain an upright posture with little to no evidence of instability; when pt increases gait speed, she leans forward and  "feel like I have to catch myself." Pt states, "I always move fast and I don't know why, I think it's from my past job having to get things done quickly"   Stairs             Wheelchair Mobility    Modified Rankin (Stroke Patients Only)       Balance Overall balance assessment: Needs assistance Sitting-balance support: No upper extremity supported;Feet supported Sitting balance-[name]: Good     Standing balance support: No upper extremity supported;During functional activity Standing balance-[name]: Good Standing balance comment: close supervision dynamically                            Cognition Arousal/Alertness: Awake/alert Behavior During Therapy: WFL for tasks assessed/performed Overall Cognitive Status: Within Functional Limits for tasks assessed                                        Exercises      General Comments General comments (skin integrity, edema, etc.): pt received preparing for d/c home; educ on strategies to improve balance with ambulation, fall risk reduction, activity recommendations      Pertinent Vitals/Pain Pain Assessment: No/denies pain Pain Intervention(s): Monitored during session    Home Living                      Prior Function            PT Goals (current goals can now be found in the care plan section) Acute Rehab PT Goals Patient  Stated Goal: to get home Progress towards PT goals: Progressing toward goals    Frequency    Min 3X/week      PT Plan Current plan remains appropriate    Co-evaluation              AM-PAC PT "6 Clicks" Mobility   Outcome Measure  Help needed turning from your back to your side while in a flat bed without using bedrails?: None Help needed moving from lying on your back to sitting on the side of a flat bed without using bedrails?: None Help needed moving to and from a bed to a chair (including a wheelchair)?: None Help needed standing  up from a chair using your arms (e.g., wheelchair or bedside chair)?: None Help needed to walk in hospital room?: A Little Help needed climbing 3-5 steps with a railing? : A Little 6 Click Score: 22    End of Session   Activity Tolerance: Patient tolerated treatment well Patient left: Other (comment) (for discharge) Nurse Communication: Mobility status PT Visit Diagnosis: Unsteadiness on feet (R26.81);Other abnormalities of gait and mobility (R26.89);Muscle weakness (generalized) (M62.81);Difficulty in walking, not elsewhere classified (R26.2)     Time: 3818-2993 PT Time Calculation (min) (ACUTE ONLY): 12 min  Charges:  $Self Care/Home Management: 8-22                     Ina Homes, PT, DPT Acute Rehabilitation Services  Pager [phone] Office [phone]  Malachy Chamber 10/01/2020, 11:23 AM

## 2020-10-01 NOTE — TOC Transition Note (Signed)
Transition of Care [facility]) - CM/SW Discharge Note   Patient Details  Name: Mary Mccarthy MRN: [id number] Date of Birth: 03/18/1940  [facility]) CM/SW Contact:  [name], RN Phone Number: 10/01/2020, 8:22 AM   Clinical Narrative:    NCM spoke with patient at the bedside, offered choice, she chose [name],  NCM made referral to [facility] with [facility] and he is able to take referral for HHPT, HHOT.  Soc will begin 24 to 48 hrs post dc.  Patient states she has a scale at home.  Her nephew will transport her home at dc today.  She also has a walker and a cane and shower stool , she has grab bars in the shower.   Her PCP is [name].     Final next level of care: Home w Home Health Services Barriers to Discharge: No Barriers Identified   Patient Goals and CMS Choice Patient states their goals for this hospitalization and ongoing recovery are:: return home with [facility] CMS Medicare.gov Compare Post Acute Care list provided to:: Patient Choice offered to / list presented to : Patient  Discharge Placement                       Discharge Plan and Services                  DME Agency: NA       HH Arranged: PT, OT HH Agency: [facility] Date [facility] Agency Contacted: 10/01/20 Time HH Agency Contacted: [phone] Representative spoke with at [facility] Agency: Kandee Keen  Social Determinants of Health (SDOH) Interventions     Readmission Risk Interventions No flowsheet data found.

## 2020-10-01 NOTE — Progress Notes (Signed)
Pt's O2 temporarily dropped to 64% twice while pt sleeping.

## 2020-10-05 DIAGNOSIS — I252 Old myocardial infarction: Secondary | ICD-10-CM | POA: Diagnosis not present

## 2020-10-05 DIAGNOSIS — I110 Hypertensive heart disease with heart failure: Secondary | ICD-10-CM | POA: Diagnosis not present

## 2020-10-05 DIAGNOSIS — I5033 Acute on chronic diastolic (congestive) heart failure: Secondary | ICD-10-CM | POA: Diagnosis not present

## 2020-10-05 DIAGNOSIS — I480 Paroxysmal atrial fibrillation: Secondary | ICD-10-CM | POA: Diagnosis not present

## 2020-10-05 DIAGNOSIS — I2510 Atherosclerotic heart disease of native coronary artery without angina pectoris: Secondary | ICD-10-CM | POA: Diagnosis not present

## 2020-10-06 ENCOUNTER — Other Ambulatory Visit: Payer: Self-pay

## 2020-10-06 ENCOUNTER — Encounter: Payer: Self-pay | Admitting: Internal Medicine

## 2020-10-06 ENCOUNTER — Ambulatory Visit (INDEPENDENT_AMBULATORY_CARE_PROVIDER_SITE_OTHER): Payer: Medicare Other

## 2020-10-06 ENCOUNTER — Ambulatory Visit (INDEPENDENT_AMBULATORY_CARE_PROVIDER_SITE_OTHER): Payer: Medicare Other | Admitting: Internal Medicine

## 2020-10-06 ENCOUNTER — Telehealth: Payer: Self-pay | Admitting: Internal Medicine

## 2020-10-06 VITALS — BP 148/62 | HR 67 | Temp 98.1°F | Ht 61.0 in | Wt 201.0 lb

## 2020-10-06 DIAGNOSIS — I480 Paroxysmal atrial fibrillation: Secondary | ICD-10-CM

## 2020-10-06 DIAGNOSIS — I5032 Chronic diastolic (congestive) heart failure: Secondary | ICD-10-CM

## 2020-10-06 DIAGNOSIS — G4734 Idiopathic sleep related nonobstructive alveolar hypoventilation: Secondary | ICD-10-CM

## 2020-10-06 DIAGNOSIS — F419 Anxiety disorder, unspecified: Secondary | ICD-10-CM | POA: Diagnosis not present

## 2020-10-06 DIAGNOSIS — J9811 Atelectasis: Secondary | ICD-10-CM | POA: Diagnosis not present

## 2020-10-06 DIAGNOSIS — I350 Nonrheumatic aortic (valve) stenosis: Secondary | ICD-10-CM

## 2020-10-06 DIAGNOSIS — R0600 Dyspnea, unspecified: Secondary | ICD-10-CM | POA: Diagnosis not present

## 2020-10-06 DIAGNOSIS — E1165 Type 2 diabetes mellitus with hyperglycemia: Secondary | ICD-10-CM

## 2020-10-06 DIAGNOSIS — E559 Vitamin D deficiency, unspecified: Secondary | ICD-10-CM | POA: Diagnosis not present

## 2020-10-06 DIAGNOSIS — I517 Cardiomegaly: Secondary | ICD-10-CM | POA: Diagnosis not present

## 2020-10-06 DIAGNOSIS — I509 Heart failure, unspecified: Secondary | ICD-10-CM | POA: Diagnosis not present

## 2020-10-06 LAB — BASIC METABOLIC PANEL
BUN: 18 mg/dL (ref 6–23)
CO2: 28 mEq/L (ref 19–32)
Calcium: 10.6 mg/dL — ABNORMAL HIGH (ref 8.4–10.5)
Chloride: 104 mEq/L (ref 96–112)
Creatinine, Ser: 0.92 mg/dL (ref 0.40–1.20)
GFR: 58.19 mL/min — ABNORMAL LOW (ref >60.00)
Glucose, Bld: 87 mg/dL (ref 70–99)
Potassium: 4.4 mEq/L (ref 3.5–5.1)
Sodium: 138 mEq/L (ref 135–145)

## 2020-10-06 LAB — CBC WITH DIFFERENTIAL/PLATELET
Basophils Absolute: 0 10*3/uL (ref 0.0–0.1)
Basophils Relative: 0.5 % (ref 0.0–3.0)
Eosinophils Absolute: 0.1 10*3/uL (ref 0.0–0.7)
Eosinophils Relative: 1.6 % (ref 0.0–5.0)
HCT: 31.6 % — ABNORMAL LOW (ref 36.0–46.0)
Hemoglobin: 10.6 g/dL — ABNORMAL LOW (ref 12.0–15.0)
Lymphocytes Relative: 24.2 % (ref 12.0–46.0)
Lymphs Abs: 1.7 10*3/uL (ref 0.7–4.0)
MCHC: 33.5 g/dL (ref 30.0–36.0)
MCV: 95.9 fl (ref 78.0–100.0)
Monocytes Absolute: 0.5 10*3/uL (ref 0.1–1.0)
Monocytes Relative: 7.7 % (ref 3.0–12.0)
Neutro Abs: 4.5 10*3/uL (ref 1.4–7.7)
Neutrophils Relative %: 66 % (ref 43.0–77.0)
Platelets: 219 10*3/uL (ref 150.0–400.0)
RBC: 3.29 Mil/uL — ABNORMAL LOW (ref 3.87–5.11)
RDW: 13.4 % (ref 11.5–15.5)
WBC: 6.8 10*3/uL (ref 4.0–10.5)

## 2020-10-06 LAB — HEPATIC FUNCTION PANEL
ALT: 11 U/L (ref 0–35)
AST: 18 U/L (ref 0–37)
Albumin: 3.7 g/dL (ref 3.5–5.2)
Alkaline Phosphatase: 38 U/L — ABNORMAL LOW (ref 39–117)
Bilirubin, Direct: 0.2 mg/dL (ref 0.0–0.3)
Total Bilirubin: 1 mg/dL (ref 0.2–1.2)
Total Protein: 6.7 g/dL (ref 6.0–8.3)

## 2020-10-06 IMAGING — DX DG CHEST 2V
2 series · 2 of 2 positions shown · non-contrast
Comparison: [DATE]

CLINICAL DATA: Anxiety, dyspnea on exertion, CHF

EXAM:
CHEST - 2 VIEW

[chest lat]
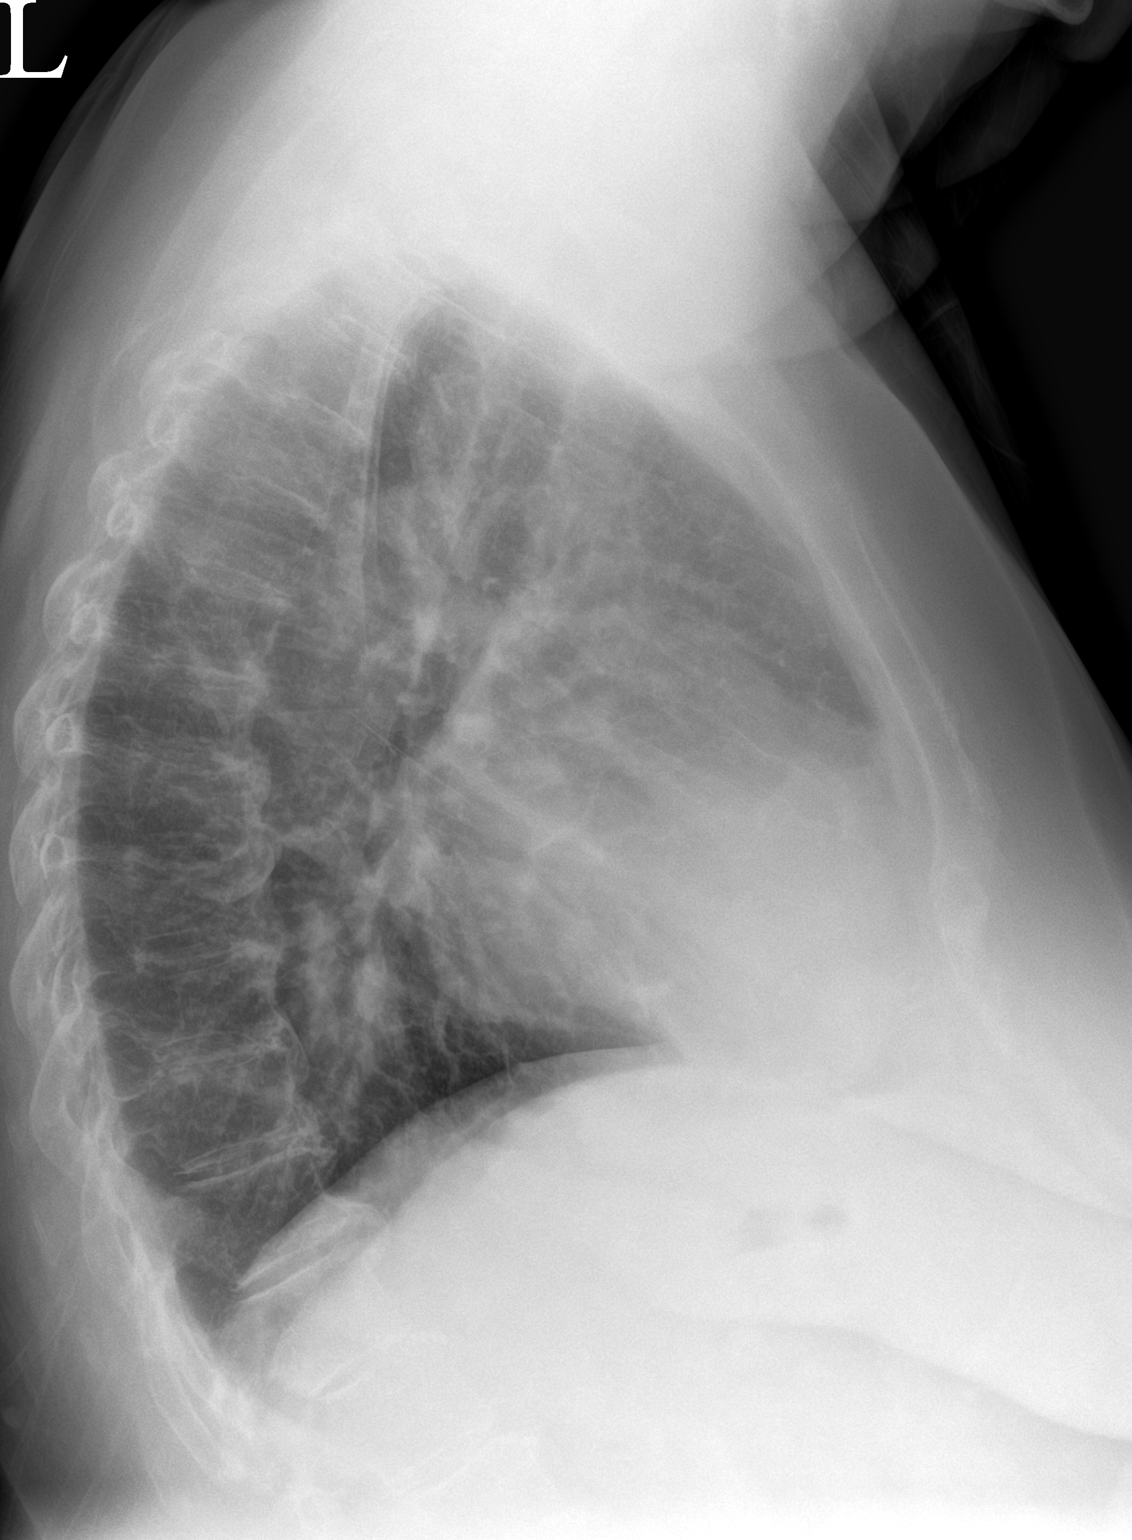

[chest pa]
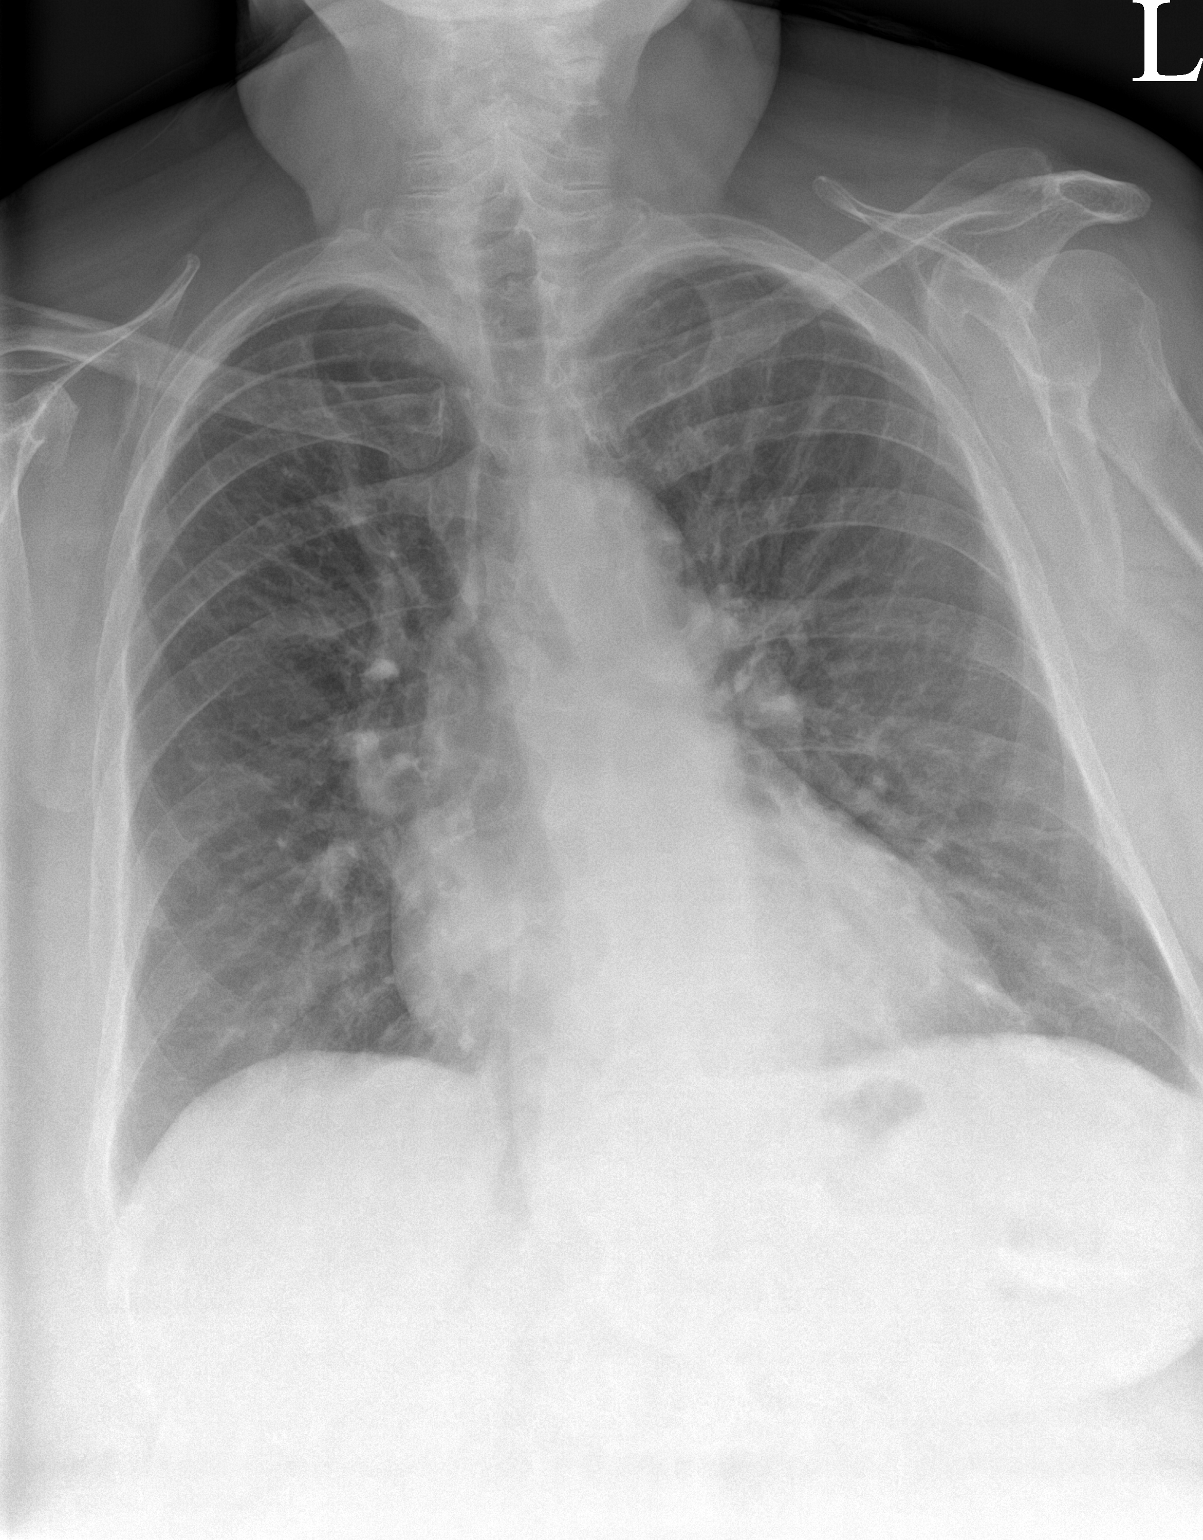

[2 of 2 positions shown; findings below may reference images not displayed]

FINDINGS: Stable cardiomegaly with minor basilar atelectasis. No focal
pneumonia, collapse or consolidation. Negative for edema, large
effusion. Trachea midline. Aorta atherosclerotic. Degenerative
changes of the spine.
IMPRESSION: Stable cardiomegaly and basilar atelectasis.

No interval change or acute process by plain radiography

Aortic Atherosclerosis ([CE]-[CE]).

## 2020-10-06 MED ORDER — FUROSEMIDE 20 MG PO TABS: 20.0000 mg | ORAL_TABLET | Freq: Every day | ORAL | 5 refills | Status: DC

## 2020-10-06 MED ORDER — ALPRAZOLAM 0.25 MG PO TABS: 0.2500 mg | ORAL_TABLET | Freq: Three times a day (TID) | ORAL | 1 refills | Status: DC | PRN

## 2020-10-06 NOTE — Telephone Encounter (Signed)
Ok for verbals 

## 2020-10-06 NOTE — Patient Instructions (Addendum)
Your oxygen level in the office was OK today with walking  You will be contacted regarding the referral for: oxygen level with sleeping  Please take all new medication as prescribed - the lasix 20 mg fluid pill - 1 per day only  Please take all new medication as prescribed - the xanax as needed for anxiety  Please continue all other medications as before, and refills have been done if requested.  Please have the pharmacy call with any other refills you may need.  Please keep your appointments with your specialists as you may have planned  Please go to the XRAY Department in the first floor for the x-ray testing  Please go to the LAB at the blood drawing area for the tests to be done  You will be contacted by phone if any changes need to be made immediately.  Otherwise, you will receive a letter about your results with an explanation, but please check with MyChart first.  Please remember to sign up for MyChart if you have not done so, as this will be important to you in the future with finding out test results, communicating by private email, and scheduling acute appointments online when needed.  Please make an Appointment to return in 1 week

## 2020-10-06 NOTE — Progress Notes (Signed)
Patient ID: Mary Mccarthy, female   DOB: 01-25-1941, 80 y.o.   MRN: [id number]        Chief Complaint: follow up recent diast CHF, AS, anxiety,and afib on eliquis, and pt concerned about low o2 at nigh as she had in the hospital, as well as low oxygen       HPI:  Mary Mccarthy is a 80 y.o. female here with above; still with mild to mod sob/doe ? Anxiety related vs other; very uncomfortable feels like she is suffocating per pt, asks for lab testing in f/u today .  Asks for check low o2 sat at night during hospn, sent home with no o2.  BP has been mild elevated at home, does not want change for now.   Pt concerned about Hgb about 10.4, no overt bleeding on eliquis.  Also is Scheduled for TAVR sept 20,  Due for f/u here sept 14 wondering if needs to keep that appt.         Wt Readings from Last 3 Encounters:  10/06/20 201 lb (91.2 kg)  10/01/20 200 lb 12.8 oz (91.1 kg)  08/23/20 209 lb 9.6 oz (95.1 kg)   BP Readings from Last 3 Encounters:  10/06/20 (!) 148/62  10/01/20 (!) 160/54  08/23/20 (!) 142/74         Past Medical History:  Diagnosis Date   ALLERGIC RHINITIS 06/24/2007   Anemia    AVM (arteriovenous malformation) of colon    Blood transfusion without reported diagnosis 05/2015   COLONIC POLYPS, HX OF 06/24/2007   Coronary artery disease, non-occlusive 05/2015   Trop + w/ Acute Anemia =>CATH: small RI - Ostial 60%, ostial RCA 30% and dLAD 40-50%;; 9/'21: Cor Ca Score 624. Mild (25-49%) prox RCA & LAD,; Moderate (50-69%) Ostial Small RI & prox LCx.     DEPRESSION 10/08/2006   DIABETES MELLITUS, TYPE II 10/05/2006   GERD 06/24/2007   HYPERLIPIDEMIA 10/08/2006   HYPERTENSION 10/08/2006   INSOMNIA 09/24/2008   Left knee DJD    Moderate to severe aortic stenosis 05/2015   a) Mod AS (mean gras 22 mmHg, peak 42 mmHg -AVA 1.1-1.2 cm.);b) 04/2017: Severe Ca2+ AoV w/ Mod-Severe AS (p-m grad 60-34 mmHg -Moderate), calculated AVA 0.8 cm -Severe;;c) 03/2019: Mod AS (m grad 31 mmHg); d) 09/2019: Mod AS  (m grad 34.5 mmHg).   Myocardial infarction ([place]) 05/2015   Type 2 MI - related to GI Bleed / Anemia (non-obstructive CAD by Cath)   Osteoporosis 03/10/2016   PEPTIC ULCER DISEASE 10/08/2006   Past Surgical History:  Procedure Laterality Date   APPENDECTOMY     BREAST BIOPSY     CARDIAC CATHETERIZATION N/A 05/21/2015   Procedure: Left Heart Cath and Coronary Angiography;  Surgeon: Leonie Man, MD;  Location: [facility] [place];  Service: Cardiovascular;: Ost RI 60%, Ost RCA 30%, dLAD tapers to small vessel w/ 40-50%. Mildly elevated LVEDP. Normal LV Fxn.   COLONOSCOPY N/A 05/20/2015   Procedure: COLONOSCOPY;  Surgeon: Manus Gunning, MD;  Location: [place];  Service: Gastroenterology;  Laterality: N/A;   CORONARY CA2+ SCORE / CARDIAC CT ANGIOGRAM  10/09/2019   Calcium score 624.  82nd percentile. Dominant RCA: Mild (25-49%) proximal stenosis-distal bifurcation into PDA and PAV--< RPL branches.  LAD (1 major mid vessel diagonal) diffuse calcified plaque, mild proximal stenosis with minimal distal stenosis.  Small RI moderate ostial disease.  LCx-moderate mixed (50-69%) proximal stenosis.  Small dOM1 disease.  Trileaflet AoV, annular Ca2+ - probable  AS   ESOPHAGOGASTRODUODENOSCOPY N/A 05/20/2015   Procedure: ESOPHAGOGASTRODUODENOSCOPY (EGD);  Surgeon: Manus Gunning, MD;  Location: [facility];  Service: Gastroenterology;  Laterality: N/A;   RIGHT/LEFT HEART CATH AND CORONARY ANGIOGRAPHY N/A 09/29/2020   Procedure: RIGHT/LEFT HEART CATH AND CORONARY ANGIOGRAPHY;  Surgeon: Burnell Blanks, MD;  Location: [place] [facility];  Service: Cardiovascular;  Laterality: N/A;   TONSILLECTOMY     TRANSTHORACIC ECHOCARDIOGRAM  03/2019; 09/2019   a) EF 60 to 65%.  Moderate LVH.  GRII DD.  Mod-Severe AS (m grad 36 mmHg, peak 59 mmHg); b) EF 65-70%, No RWMA. Gr1 DD/hi LAP, Mild hi PAP. Mod LA Dil. MOD AS (mean Grad 34.5 mmHg).  STABLE    TRANSTHORACIC ECHOCARDIOGRAM  04/12/2017   EF  60-65%. No RWMA.  GR 1 DD.  Moderate concentric LVH.  Mild LA dilation. Severe calcified aortic valve with moderate-severe aortic stenosis (peak/mean gradients 60/34 mmHg) and in severe range by AVA (0.8 cm2).  Mild to moderately increased PA pressures 41 mmHg with normal RV size and function..     TUBAL LIGATION      reports that she has never smoked. She has never used smokeless tobacco. She reports that she does not drink alcohol and does not use drugs. family history includes Alcohol abuse in an other family member; Arrhythmia in her sister; Arthritis in an other family member; CAD in her sister; Cancer in her mother; Depression in an other family member; Diabetes in an other family member; Heart attack (age of onset: 38) in her sister; Heart disease in an other family member; Hyperlipidemia in an other family member; Hypertension in an other family member; Stroke in an other family member. Allergies  Allergen Reactions   Ibuprofen Other (See Comments)    Bleeding events   Current Outpatient Medications on File Prior to Visit  Medication Sig Dispense Refill   acetaminophen (TYLENOL) 500 MG tablet Take 500 mg by mouth every 6 (six) hours as needed for moderate pain.     amiodarone (PACERONE) 200 MG tablet Take 2 tablets (400 mg total) by mouth daily. 30 tablet 0   apixaban (ELIQUIS) 5 MG TABS tablet Take 1 tablet (5 mg total) by mouth 2 (two) times daily. 60 tablet 5   aspirin 81 MG EC tablet Take 1 tablet (81 mg total) by mouth daily. Swallow whole. 30 tablet 11   atorvastatin (LIPITOR) 40 MG tablet TAKE 1 TABLET BY MOUTH EVERY DAY (Patient taking differently: Take 40 mg by mouth daily.) 90 tablet 3   blood glucose meter kit and supplies Dispense based on patient and insurance preference. Use up to four times daily as directed. E11.9 1 each 0   Blood Glucose Monitoring Suppl (ONE TOUCH ULTRA 2) w/Device KIT Use as directed daily 1 each 0   carvedilol (COREG) 6.25 MG tablet TAKE 1 TABLET BY  MOUTH TWICE A DAY (Patient taking differently: Take 6.25 mg by mouth 2 (two) times daily with a meal.) 180 tablet 2   cyanocobalamin ([employer] VITAMIN B12) 1000 MCG tablet Take 1 tablet (1,000 mcg total) by mouth daily. 90 tablet 0   glimepiride (AMARYL) 2 MG tablet TAKE 1 TABLET BY MOUTH EVERY DAY BEFORE BREAKFAST (Patient taking differently: Take 2 mg by mouth daily with breakfast.) 90 tablet 1   Lancets (ONETOUCH DELICA PLUS [id number]) MISC USE AS DIRECTED TEST 1-2 TIMES A DAY 100 each 23   metFORMIN (GLUCOPHAGE) 1000 MG tablet TAKE 1 TAB BY MOUTH IN THE AM,AND 1/2  TAB IN THE PM (Patient taking differently: Take 500-1,000 mg by mouth 2 (two) times daily with a meal. Take 1065m in the AM and 500 in the PM) 135 tablet 0   ONETOUCH ULTRA test strip USE AS INSTRUCTED TEST 1-2 TIMES A DAY 100 strip 23   pantoprazole (PROTONIX) 40 MG tablet Take 1 tablet (40 mg total) by mouth daily. 90 tablet 0   No current facility-administered medications on file prior to visit.        ROS:  All others reviewed and negative.  Objective        PE:  BP (!) 148/62 (BP Location: Right Arm, Patient Position: Sitting, Cuff Size: Large)   Pulse 67   Temp 98.1 F (36.7 C) (Oral)   Ht _0  (1.549 m)   Wt 201 lb (91.2 kg)   SpO2 98%   BMI 37.98 kg/m        Amb o2 sat 96% at 100 ft                Constitutional: Pt appears in NAD               HENT: Head: NCAT.                Right Ear: External ear normal.                 Left Ear: External ear normal.                Eyes: . Pupils are equal, round, and reactive to light. Conjunctivae and EOM are normal               Nose: without d/c or deformity               Neck: Neck supple. Gross normal ROM               Cardiovascular: Normal rate and regular rhythm.  , gr 2-3/6 sys murmur RUSB               Pulmonary/Chest: Effort normal and breath sounds without rales or wheezing.                Abd:  Soft, NT, ND, + BS, no organomegaly               Neurological: Pt  is alert. At baseline orientation, motor grossly intact               Skin: Skin is warm. No rashes, no other new lesions, LE edema - trace bilat               Psychiatric: Pt behavior is normal without agitation , 2+ nervous  Micro: none  Cardiac tracings I have personally interpreted today:  none  Pertinent Radiological findings (summarize): none   Lab Results  Component Value Date   WBC 6.8 10/06/2020   HGB 10.6 (L) 10/06/2020   HCT 31.6 (L) 10/06/2020   PLT 219.0 10/06/2020   GLUCOSE 87 10/06/2020   CHOL 97 08/11/2020   TRIG 86 08/11/2020   HDL 32 (L) 08/11/2020   LDLCALC 48 08/11/2020   ALT 11 10/06/2020   AST 18 10/06/2020   NA 138 10/06/2020   K 4.4 10/06/2020   CL 104 10/06/2020   CREATININE 0.92 10/06/2020   BUN 18 10/06/2020   CO2 28 10/06/2020   TSH 3.422 08/11/2020   INR 1.1 08/10/2020   HGBA1C 7.4 (H) 08/10/2020   MICROALBUR  1.8 03/18/2020   Assessment/Plan:  Joshalyn Ancheta is a 80 y.o. White or Caucasian [1] female with  has a past medical history of ALLERGIC RHINITIS (06/24/2007), Anemia, AVM (arteriovenous malformation) of colon, Blood transfusion without reported diagnosis (05/2015), COLONIC POLYPS, HX OF (06/24/2007), Coronary artery disease, non-occlusive (05/2015), DEPRESSION (10/08/2006), DIABETES MELLITUS, TYPE II (10/05/2006), GERD (06/24/2007), HYPERLIPIDEMIA (10/08/2006), HYPERTENSION (10/08/2006), INSOMNIA (09/24/2008), Left knee DJD, Moderate to severe aortic stenosis (05/2015), Myocardial infarction ([place]) (05/2015), Osteoporosis (03/10/2016), and PEPTIC ULCER DISEASE (10/08/2006).  Acute on chronic diastolic CHF (congestive heart failure) (HCC) For lasix 20 qd prn,  to f/u any worsening symptoms or concerns  Chronic diastolic heart failure (HCC) For lasix 20 qd prn, also for home ONO per [employer], and cxr today  Paroxysmal atrial fibrillation (HCC) Stable rate today, cont same tx  Severe aortic stenosis For TAVR soon  Vitamin D deficiency Last vitamin  D Lab Results  Component Value Date   VD25OH 14.34 (L) 03/18/2020   Low, to start oral replacement   Type 2 diabetes mellitus with hyperglycemia, without long-term current use of insulin (HCC) Lab Results  Component Value Date   HGBA1C 7.4 (H) 08/10/2020   Uncontrolled but mild elev only and adequate for age, pt to continue current medical treatment glimeparide, metformin    Anxiety Mild uncontrolled, for xanax .25 bid prn,  to f/u any worsening symptoms or concerns  Followup: No follow-ups on file.  Cathlean Cower, MD 10/10/2020 7:06 PM [facility]

## 2020-10-06 NOTE — Telephone Encounter (Signed)
HH ORDERS   Caller Name: [facility] Phone #: [phone] Service Requested: OT/PT Frequency of Visits: 1 week 4

## 2020-10-07 ENCOUNTER — Encounter: Payer: Self-pay | Admitting: Internal Medicine

## 2020-10-07 DIAGNOSIS — I5033 Acute on chronic diastolic (congestive) heart failure: Secondary | ICD-10-CM | POA: Diagnosis not present

## 2020-10-07 DIAGNOSIS — I252 Old myocardial infarction: Secondary | ICD-10-CM | POA: Diagnosis not present

## 2020-10-07 DIAGNOSIS — I480 Paroxysmal atrial fibrillation: Secondary | ICD-10-CM | POA: Diagnosis not present

## 2020-10-07 DIAGNOSIS — I2510 Atherosclerotic heart disease of native coronary artery without angina pectoris: Secondary | ICD-10-CM | POA: Diagnosis not present

## 2020-10-07 DIAGNOSIS — I110 Hypertensive heart disease with heart failure: Secondary | ICD-10-CM | POA: Diagnosis not present

## 2020-10-07 NOTE — Telephone Encounter (Signed)
LDVM for Mary Mccarthy with the Verbal OK per Dr. Jonny Ruiz for OT/PT 1 week 4.

## 2020-10-10 ENCOUNTER — Encounter: Payer: Self-pay | Admitting: Internal Medicine

## 2020-10-10 NOTE — Assessment & Plan Note (Signed)
Stable rate today, cont same tx

## 2020-10-10 NOTE — Assessment & Plan Note (Signed)
Last vitamin D Lab Results  Component Value Date   VD25OH 14.34 (L) 03/18/2020   Low, to start oral replacement

## 2020-10-10 NOTE — Assessment & Plan Note (Signed)
For lasix 20 qd prn,  to f/u any worsening symptoms or concerns

## 2020-10-10 NOTE — Assessment & Plan Note (Signed)
For TAVR soon

## 2020-10-10 NOTE — Assessment & Plan Note (Addendum)
For lasix 20 qd prn, also for home ONO per [employer], and cxr today

## 2020-10-10 NOTE — Assessment & Plan Note (Signed)
Lab Results  Component Value Date   HGBA1C 7.4 (H) 08/10/2020   Uncontrolled but mild elev only and adequate for age, pt to continue current medical treatment glimeparide, metformin

## 2020-10-10 NOTE — Assessment & Plan Note (Signed)
Mild uncontrolled, for xanax .25 bid prn,  to f/u any worsening symptoms or concerns

## 2020-10-12 ENCOUNTER — Other Ambulatory Visit: Payer: Self-pay | Admitting: Internal Medicine

## 2020-10-12 ENCOUNTER — Telehealth: Payer: Self-pay | Admitting: Internal Medicine

## 2020-10-12 MED ORDER — AMIODARONE HCL 200 MG PO TABS: 400.0000 mg | ORAL_TABLET | Freq: Every day | ORAL | 0 refills | Status: DC

## 2020-10-12 NOTE — Telephone Encounter (Signed)
1.Medication Requested: amiodarone (PACERONE) 200 MG tablet   2. Pharmacy (Name, Street, [place]): [phone] [place], [place] - [address] AT [address]  Phone:  [phone] Fax:  [phone]   3. On Med List: yes  4. Last Visit with PCP: 08.31.22  5. Next visit date with PCP: 09.07.22   Agent: Please be advised that RX refills may take up to 3 business days. We ask that you follow-up with your pharmacy.

## 2020-10-13 ENCOUNTER — Encounter: Payer: Self-pay | Admitting: Internal Medicine

## 2020-10-13 ENCOUNTER — Other Ambulatory Visit: Payer: Self-pay

## 2020-10-13 ENCOUNTER — Ambulatory Visit (INDEPENDENT_AMBULATORY_CARE_PROVIDER_SITE_OTHER): Payer: Medicare Other | Admitting: Internal Medicine

## 2020-10-13 VITALS — BP 150/66 | HR 65 | Temp 98.2°F | Ht 61.0 in | Wt 199.0 lb

## 2020-10-13 DIAGNOSIS — I480 Paroxysmal atrial fibrillation: Secondary | ICD-10-CM | POA: Diagnosis not present

## 2020-10-13 DIAGNOSIS — I5032 Chronic diastolic (congestive) heart failure: Secondary | ICD-10-CM

## 2020-10-13 DIAGNOSIS — I10 Essential (primary) hypertension: Secondary | ICD-10-CM | POA: Diagnosis not present

## 2020-10-13 DIAGNOSIS — F419 Anxiety disorder, unspecified: Secondary | ICD-10-CM | POA: Diagnosis not present

## 2020-10-13 DIAGNOSIS — Z23 Encounter for immunization: Secondary | ICD-10-CM

## 2020-10-13 LAB — BASIC METABOLIC PANEL
BUN: 24 mg/dL — ABNORMAL HIGH (ref 6–23)
CO2: 28 mEq/L (ref 19–32)
Calcium: 9.8 mg/dL (ref 8.4–10.5)
Chloride: 103 mEq/L (ref 96–112)
Creatinine, Ser: 1.18 mg/dL (ref 0.40–1.20)
GFR: 43.16 mL/min — ABNORMAL LOW (ref >60.00)
Glucose, Bld: 86 mg/dL (ref 70–99)
Potassium: 4 mEq/L (ref 3.5–5.1)
Sodium: 139 mEq/L (ref 135–145)

## 2020-10-13 MED ORDER — FUROSEMIDE 20 MG PO TABS: 20.0000 mg | ORAL_TABLET | Freq: Every day | ORAL | 5 refills | Status: DC | PRN

## 2020-10-13 NOTE — Patient Instructions (Signed)
You had the flu shot today  Ok to take the lasix only AS NEEDED  Please continue all other medications as before, and refills have been done if requested.  Please have the pharmacy call with any other refills you may need.  Please keep your appointments with your specialists as you may have planned  Please go to the LAB at the blood drawing area for the tests to be done  You will be contacted by phone if any changes need to be made immediately.  Otherwise, you will receive a letter about your results with an explanation, but please check with MyChart first.  Please remember to sign up for MyChart if you have not done so, as this will be important to you in the future with finding out test results, communicating by private email, and scheduling acute appointments online when needed.  Daisey Must with your [facility]!

## 2020-10-13 NOTE — Progress Notes (Signed)
Patient ID: Mary Mccarthy, female   DOB: 13-Jun-1940, 80 y.o.   MRN: [id number]        Chief Complaint: follow up anxiety, leg swelling, paf, htn       HPI:  Mary Mccarthy is a 80 y.o. female here overall much improved with anxiety on prn benzo, and swelling improved somewhat and wt down several lbs, feeling much appreciative; Denies worsening depressive symptoms, suicidal ideation, or panic; Pt denies chest pain, increased sob or doe, wheezing, orthopnea, PND, increased LE swelling, palpitations, dizziness or syncope.    Pt denies polydipsia, polyuria, or new focal neuro s/s.      Wt Readings from Last 3 Encounters:  10/13/20 199 lb (90.3 kg)  10/06/20 201 lb (91.2 kg)  10/01/20 200 lb 12.8 oz (91.1 kg)   BP Readings from Last 3 Encounters:  10/13/20 (!) 150/66  10/06/20 (!) 148/62  10/01/20 (!) 160/54         Past Medical History:  Diagnosis Date   ALLERGIC RHINITIS 06/24/2007   Anemia    AVM (arteriovenous malformation) of colon    Blood transfusion without reported diagnosis 05/2015   COLONIC POLYPS, HX OF 06/24/2007   Coronary artery disease, non-occlusive 05/2015   Trop + w/ Acute Anemia =>CATH: small RI - Ostial 60%, ostial RCA 30% and dLAD 40-50%;; 9/'21: Cor Ca Score 624. Mild (25-49%) prox RCA & LAD,; Moderate (50-69%) Ostial Small RI & prox LCx.     DEPRESSION 10/08/2006   DIABETES MELLITUS, TYPE II 10/05/2006   GERD 06/24/2007   History of CVA (cerebrovascular accident)    08/2020- found to be in afib with RVR, started on Eliquis   HYPERLIPIDEMIA 10/08/2006   HYPERTENSION 10/08/2006   INSOMNIA 09/24/2008   Left knee DJD    Osteoporosis 03/10/2016   PAF (paroxysmal atrial fibrillation) ([place])    PEPTIC ULCER DISEASE 10/08/2006   Severe aortic stenosis    Past Surgical History:  Procedure Laterality Date   APPENDECTOMY     BREAST BIOPSY     CARDIAC CATHETERIZATION N/A 05/21/2015   Procedure: Left Heart Cath and Coronary Angiography;  Surgeon: Mary Man, MD;   Location: [place] [facility];  Service: Cardiovascular;: Ost RI 60%, Ost RCA 30%, dLAD tapers to small vessel w/ 40-50%. Mildly elevated LVEDP. Normal LV Fxn.   COLONOSCOPY N/A 05/20/2015   Procedure: COLONOSCOPY;  Surgeon: Mary Gunning, MD;  Location: [facility];  Service: Gastroenterology;  Laterality: N/A;   CORONARY CA2+ SCORE / CARDIAC CT ANGIOGRAM  10/09/2019   Calcium score 624.  82nd percentile. Dominant RCA: Mild (25-49%) proximal stenosis-distal bifurcation into PDA and PAV--< RPL branches.  LAD (1 major mid vessel diagonal) diffuse calcified plaque, mild proximal stenosis with minimal distal stenosis.  Small RI moderate ostial disease.  LCx-moderate mixed (50-69%) proximal stenosis.  Small dOM1 disease.  Trileaflet AoV, annular Ca2+ - probable AS   ESOPHAGOGASTRODUODENOSCOPY N/A 05/20/2015   Procedure: ESOPHAGOGASTRODUODENOSCOPY (EGD);  Surgeon: Mary Gunning, MD;  Location: [facility];  Service: Gastroenterology;  Laterality: N/A;   RIGHT/LEFT HEART CATH AND CORONARY ANGIOGRAPHY N/A 09/29/2020   Procedure: RIGHT/LEFT HEART CATH AND CORONARY ANGIOGRAPHY;  Surgeon: Mary Blanks, MD;  Location: [place] [facility];  Service: Cardiovascular;  Laterality: N/A;   TONSILLECTOMY     TRANSTHORACIC ECHOCARDIOGRAM  03/2019; 09/2019   a) EF 60 to 65%.  Moderate LVH.  GRII DD.  Mod-Severe AS (m grad 36 mmHg, peak 59 mmHg); b) EF 65-70%, No RWMA. Gr1 DD/hi  LAP, Mild hi PAP. Mod LA Dil. MOD AS (mean Grad 34.5 mmHg).  STABLE    TRANSTHORACIC ECHOCARDIOGRAM  04/12/2017   EF 60-65%. No RWMA.  GR 1 DD.  Moderate concentric LVH.  Mild LA dilation. Severe calcified aortic valve with moderate-severe aortic stenosis (peak/mean gradients 60/34 mmHg) and in severe range by AVA (0.8 cm2).  Mild to moderately increased PA pressures 41 mmHg with normal RV size and function..     TUBAL LIGATION      reports that she has never smoked. She has never used smokeless tobacco. She reports that  she does not drink alcohol and does not use drugs. family history includes Alcohol abuse in an other family member; Arrhythmia in her sister; Arthritis in an other family member; CAD in her sister; Cancer in her mother; Depression in an other family member; Diabetes in an other family member; Heart attack (age of onset: 81) in her sister; Heart disease in an other family member; Hyperlipidemia in an other family member; Hypertension in an other family member; Stroke in an other family member. Allergies  Allergen Reactions   Ibuprofen Other (See Comments)    Bleeding events   Current Outpatient Medications on File Prior to Visit  Medication Sig Dispense Refill   acetaminophen (TYLENOL) 500 MG tablet Take 500 mg by mouth every 6 (six) hours as needed for moderate pain.     ALPRAZolam (XANAX) 0.25 MG tablet Take 1 tablet (0.25 mg total) by mouth 3 (three) times daily as needed for anxiety. 40 tablet 1   amiodarone (PACERONE) 200 MG tablet Take 2 tablets (400 mg total) by mouth daily. 30 tablet 0   apixaban (ELIQUIS) 5 MG TABS tablet Take 1 tablet (5 mg total) by mouth 2 (two) times daily. 60 tablet 5   aspirin 81 MG EC tablet Take 1 tablet (81 mg total) by mouth daily. Swallow whole. 30 tablet 11   atorvastatin (LIPITOR) 40 MG tablet TAKE 1 TABLET BY MOUTH EVERY DAY (Patient taking differently: Take 40 mg by mouth daily.) 90 tablet 3   blood glucose meter kit and supplies Dispense based on patient and insurance preference. Use up to four times daily as directed. E11.9 1 each 0   Blood Glucose Monitoring Suppl (ONE TOUCH ULTRA 2) w/Device KIT Use as directed daily 1 each 0   carvedilol (COREG) 6.25 MG tablet TAKE 1 TABLET BY MOUTH TWICE A DAY (Patient taking differently: Take 6.25 mg by mouth 2 (two) times daily with a meal.) 180 tablet 2   cyanocobalamin ([employer] VITAMIN B12) 1000 MCG tablet Take 1 tablet (1,000 mcg total) by mouth daily. 90 tablet 0   glimepiride (AMARYL) 2 MG tablet TAKE 1 TABLET BY  MOUTH EVERY DAY BEFORE BREAKFAST (Patient taking differently: Take 2 mg by mouth daily with breakfast.) 90 tablet 1   Lancets (ONETOUCH DELICA PLUS [id number]) MISC USE AS DIRECTED TEST 1-2 TIMES A DAY 100 each 23   metFORMIN (GLUCOPHAGE) 1000 MG tablet TAKE 1 TAB BY MOUTH IN THE AM,AND 1/2 TAB IN THE PM (Patient taking differently: Take 500-1,000 mg by mouth 2 (two) times daily with a meal. Take 1044m in the AM and 500 in the PM) 135 tablet 0   ONETOUCH ULTRA test strip USE AS INSTRUCTED TEST 1-2 TIMES A DAY 100 strip 23   pantoprazole (PROTONIX) 40 MG tablet Take 1 tablet (40 mg total) by mouth daily. 90 tablet 0   No current facility-administered medications on file prior to visit.  ROS:  All others reviewed and negative.  Objective        PE:  BP (!) 150/66 (BP Location: Left Arm, Patient Position: Sitting, Cuff Size: Large)   Pulse 65   Temp 98.2 F (36.8 C) (Oral)   Ht _0  (1.549 m)   Wt 199 lb (90.3 kg)   SpO2 97%   BMI 37.60 kg/m                 Constitutional: Pt appears in NAD               HENT: Head: NCAT.                Right Ear: External ear normal.                 Left Ear: External ear normal.                Eyes: . Pupils are equal, round, and reactive to light. Conjunctivae and EOM are normal               Nose: without d/c or deformity               Neck: Neck supple. Gross normal ROM               Cardiovascular: Normal rate and irregular rhythm.                 Pulmonary/Chest: Effort normal and breath sounds without rales or wheezing.                Abd:  Soft, NT, ND, + BS, no organomegaly               Neurological: Pt is alert. At baseline orientation, motor grossly intact               Skin: Skin is warm. No rashes, no other new lesions, LE edema - trace to knees only               Psychiatric: Pt behavior is normal without agitation   Micro: none  Cardiac tracings I have personally interpreted today:  none  Pertinent Radiological findings  (summarize): none   Lab Results  Component Value Date   WBC 6.8 10/06/2020   HGB 10.6 (L) 10/06/2020   HCT 31.6 (L) 10/06/2020   PLT 219.0 10/06/2020   GLUCOSE 86 10/13/2020   CHOL 97 08/11/2020   TRIG 86 08/11/2020   HDL 32 (L) 08/11/2020   LDLCALC 48 08/11/2020   ALT 11 10/06/2020   AST 18 10/06/2020   NA 139 10/13/2020   K 4.0 10/13/2020   CL 103 10/13/2020   CREATININE 1.18 10/13/2020   BUN 24 (H) 10/13/2020   CO2 28 10/13/2020   TSH 3.422 08/11/2020   INR 1.1 08/10/2020   HGBA1C 7.4 (H) 08/10/2020   MICROALBUR 1.8 03/18/2020   Assessment/Plan:  Lariya Kinzie is a 80 y.o. White or Caucasian [1] female with  has a past medical history of ALLERGIC RHINITIS (06/24/2007), Anemia, AVM (arteriovenous malformation) of colon, Blood transfusion without reported diagnosis (05/2015), COLONIC POLYPS, HX OF (06/24/2007), Coronary artery disease, non-occlusive (05/2015), DEPRESSION (10/08/2006), DIABETES MELLITUS, TYPE II (10/05/2006), GERD (06/24/2007), History of CVA (cerebrovascular accident), HYPERLIPIDEMIA (10/08/2006), HYPERTENSION (10/08/2006), INSOMNIA (09/24/2008), Left knee DJD, Osteoporosis (03/10/2016), PAF (paroxysmal atrial fibrillation) ([place]), PEPTIC ULCER DISEASE (10/08/2006), and Severe aortic stenosis.  Paroxysmal atrial fibrillation (HCC) Stable rate and volume, continue same tx  Chronic diastolic  heart failure (HCC) Improved on current lasix prn only,  to f/u any worsening symptoms or concerns  Essential hypertension BP Readings from Last 3 Encounters:  10/13/20 (!) 150/66  10/06/20 (!) 148/62  10/01/20 (!) 160/54   uncontrolled, pt to continue medical treatment coreg, lasix, declines further change in tx  Anxiety Improved, cont benzo prn,  to f/u any worsening symptoms or concerns, declines further such as ssri  Followup: Return if symptoms worsen or fail to improve.  Cathlean Cower, MD 10/16/2020 11:23 PM [facility]

## 2020-10-14 DIAGNOSIS — I110 Hypertensive heart disease with heart failure: Secondary | ICD-10-CM | POA: Diagnosis not present

## 2020-10-14 DIAGNOSIS — I480 Paroxysmal atrial fibrillation: Secondary | ICD-10-CM | POA: Diagnosis not present

## 2020-10-14 DIAGNOSIS — I252 Old myocardial infarction: Secondary | ICD-10-CM | POA: Diagnosis not present

## 2020-10-14 DIAGNOSIS — I2510 Atherosclerotic heart disease of native coronary artery without angina pectoris: Secondary | ICD-10-CM | POA: Diagnosis not present

## 2020-10-14 DIAGNOSIS — I5033 Acute on chronic diastolic (congestive) heart failure: Secondary | ICD-10-CM | POA: Diagnosis not present

## 2020-10-15 ENCOUNTER — Encounter: Payer: Self-pay | Admitting: Physician Assistant

## 2020-10-16 ENCOUNTER — Encounter: Payer: Self-pay | Admitting: Internal Medicine

## 2020-10-16 NOTE — Assessment & Plan Note (Signed)
Stable rate and volume, continue same tx

## 2020-10-16 NOTE — Assessment & Plan Note (Signed)
Improved, cont benzo prn,  to f/u any worsening symptoms or concerns, declines further such as ssri

## 2020-10-16 NOTE — Assessment & Plan Note (Signed)
Improved on current lasix prn only,  to f/u any worsening symptoms or concerns

## 2020-10-16 NOTE — Assessment & Plan Note (Signed)
BP Readings from Last 3 Encounters:  10/13/20 (!) 150/66  10/06/20 (!) 148/62  10/01/20 (!) 160/54   uncontrolled, pt to continue medical treatment coreg, lasix, declines further change in tx

## 2020-10-18 ENCOUNTER — Other Ambulatory Visit: Payer: Self-pay

## 2020-10-18 ENCOUNTER — Inpatient Hospital Stay (HOSPITAL_COMMUNITY)
Admission: EM | Admit: 2020-10-18 | Discharge: 2020-10-28 | DRG: 981 | Disposition: A | Discharge status: Home / Self Care | Payer: Medicare Other | Attending: Cardiovascular Disease | Admitting: Cardiovascular Disease

## 2020-10-18 ENCOUNTER — Emergency Department (HOSPITAL_COMMUNITY): Payer: Medicare Other

## 2020-10-18 DIAGNOSIS — K922 Gastrointestinal hemorrhage, unspecified: Secondary | ICD-10-CM | POA: Diagnosis not present

## 2020-10-18 DIAGNOSIS — Z743 Need for continuous supervision: Secondary | ICD-10-CM | POA: Diagnosis not present

## 2020-10-18 DIAGNOSIS — Z823 Family history of stroke: Secondary | ICD-10-CM

## 2020-10-18 DIAGNOSIS — D649 Anemia, unspecified: Secondary | ICD-10-CM | POA: Diagnosis present

## 2020-10-18 DIAGNOSIS — Z79899 Other long term (current) drug therapy: Secondary | ICD-10-CM

## 2020-10-18 DIAGNOSIS — R0602 Shortness of breath: Secondary | ICD-10-CM | POA: Diagnosis not present

## 2020-10-18 DIAGNOSIS — R609 Edema, unspecified: Secondary | ICD-10-CM | POA: Diagnosis not present

## 2020-10-18 DIAGNOSIS — K921 Melena: Secondary | ICD-10-CM | POA: Diagnosis not present

## 2020-10-18 DIAGNOSIS — M810 Age-related osteoporosis without current pathological fracture: Secondary | ICD-10-CM | POA: Diagnosis present

## 2020-10-18 DIAGNOSIS — I358 Other nonrheumatic aortic valve disorders: Secondary | ICD-10-CM | POA: Diagnosis present

## 2020-10-18 DIAGNOSIS — Z01818 Encounter for other preprocedural examination: Secondary | ICD-10-CM

## 2020-10-18 DIAGNOSIS — I350 Nonrheumatic aortic (valve) stenosis: Secondary | ICD-10-CM

## 2020-10-18 DIAGNOSIS — K5520 Angiodysplasia of colon without hemorrhage: Secondary | ICD-10-CM

## 2020-10-18 DIAGNOSIS — Z7982 Long term (current) use of aspirin: Secondary | ICD-10-CM

## 2020-10-18 DIAGNOSIS — E861 Hypovolemia: Secondary | ICD-10-CM | POA: Diagnosis present

## 2020-10-18 DIAGNOSIS — R0609 Other forms of dyspnea: Secondary | ICD-10-CM

## 2020-10-18 DIAGNOSIS — Z7984 Long term (current) use of oral hypoglycemic drugs: Secondary | ICD-10-CM

## 2020-10-18 DIAGNOSIS — Z8249 Family history of ischemic heart disease and other diseases of the circulatory system: Secondary | ICD-10-CM

## 2020-10-18 DIAGNOSIS — N179 Acute kidney failure, unspecified: Secondary | ICD-10-CM | POA: Diagnosis present

## 2020-10-18 DIAGNOSIS — E1169 Type 2 diabetes mellitus with other specified complication: Secondary | ICD-10-CM | POA: Diagnosis present

## 2020-10-18 DIAGNOSIS — E1165 Type 2 diabetes mellitus with hyperglycemia: Secondary | ICD-10-CM | POA: Diagnosis present

## 2020-10-18 DIAGNOSIS — E1122 Type 2 diabetes mellitus with diabetic chronic kidney disease: Secondary | ICD-10-CM | POA: Diagnosis present

## 2020-10-18 DIAGNOSIS — R911 Solitary pulmonary nodule: Secondary | ICD-10-CM | POA: Diagnosis present

## 2020-10-18 DIAGNOSIS — K254 Chronic or unspecified gastric ulcer with hemorrhage: Secondary | ICD-10-CM | POA: Diagnosis present

## 2020-10-18 DIAGNOSIS — I130 Hypertensive heart and chronic kidney disease with heart failure and stage 1 through stage 4 chronic kidney disease, or unspecified chronic kidney disease: Secondary | ICD-10-CM | POA: Diagnosis present

## 2020-10-18 DIAGNOSIS — Z952 Presence of prosthetic heart valve: Secondary | ICD-10-CM

## 2020-10-18 DIAGNOSIS — Z006 Encounter for examination for normal comparison and control in clinical research program: Secondary | ICD-10-CM

## 2020-10-18 DIAGNOSIS — D62 Acute posthemorrhagic anemia: Principal | ICD-10-CM | POA: Diagnosis present

## 2020-10-18 DIAGNOSIS — I5032 Chronic diastolic (congestive) heart failure: Secondary | ICD-10-CM | POA: Diagnosis present

## 2020-10-18 DIAGNOSIS — I272 Pulmonary hypertension, unspecified: Secondary | ICD-10-CM | POA: Diagnosis present

## 2020-10-18 DIAGNOSIS — N1832 Chronic kidney disease, stage 3b: Secondary | ICD-10-CM | POA: Diagnosis present

## 2020-10-18 DIAGNOSIS — K5521 Angiodysplasia of colon with hemorrhage: Secondary | ICD-10-CM | POA: Diagnosis present

## 2020-10-18 DIAGNOSIS — Z7901 Long term (current) use of anticoagulants: Secondary | ICD-10-CM

## 2020-10-18 DIAGNOSIS — K2970 Gastritis, unspecified, without bleeding: Secondary | ICD-10-CM | POA: Diagnosis present

## 2020-10-18 DIAGNOSIS — K219 Gastro-esophageal reflux disease without esophagitis: Secondary | ICD-10-CM | POA: Diagnosis present

## 2020-10-18 DIAGNOSIS — R001 Bradycardia, unspecified: Secondary | ICD-10-CM | POA: Diagnosis present

## 2020-10-18 DIAGNOSIS — R0600 Dyspnea, unspecified: Principal | ICD-10-CM

## 2020-10-18 DIAGNOSIS — I442 Atrioventricular block, complete: Secondary | ICD-10-CM

## 2020-10-18 DIAGNOSIS — Z8673 Personal history of transient ischemic attack (TIA), and cerebral infarction without residual deficits: Secondary | ICD-10-CM

## 2020-10-18 DIAGNOSIS — B372 Candidiasis of skin and nail: Secondary | ICD-10-CM | POA: Diagnosis present

## 2020-10-18 DIAGNOSIS — F419 Anxiety disorder, unspecified: Secondary | ICD-10-CM | POA: Diagnosis present

## 2020-10-18 DIAGNOSIS — Z833 Family history of diabetes mellitus: Secondary | ICD-10-CM

## 2020-10-18 DIAGNOSIS — I480 Paroxysmal atrial fibrillation: Secondary | ICD-10-CM | POA: Diagnosis present

## 2020-10-18 DIAGNOSIS — Z6837 Body mass index (BMI) 37.0-37.9, adult: Secondary | ICD-10-CM

## 2020-10-18 DIAGNOSIS — R6889 Other general symptoms and signs: Secondary | ICD-10-CM | POA: Diagnosis not present

## 2020-10-18 DIAGNOSIS — I2510 Atherosclerotic heart disease of native coronary artery without angina pectoris: Secondary | ICD-10-CM | POA: Diagnosis present

## 2020-10-18 DIAGNOSIS — J9811 Atelectasis: Secondary | ICD-10-CM | POA: Diagnosis present

## 2020-10-18 DIAGNOSIS — E785 Hyperlipidemia, unspecified: Secondary | ICD-10-CM | POA: Diagnosis present

## 2020-10-18 DIAGNOSIS — I700 Atherosclerosis of aorta: Secondary | ICD-10-CM | POA: Diagnosis present

## 2020-10-18 DIAGNOSIS — Z20822 Contact with and (suspected) exposure to covid-19: Secondary | ICD-10-CM | POA: Diagnosis present

## 2020-10-18 DIAGNOSIS — I5033 Acute on chronic diastolic (congestive) heart failure: Secondary | ICD-10-CM | POA: Diagnosis present

## 2020-10-18 DIAGNOSIS — Z886 Allergy status to analgesic agent status: Secondary | ICD-10-CM

## 2020-10-18 DIAGNOSIS — I10 Essential (primary) hypertension: Secondary | ICD-10-CM | POA: Diagnosis present

## 2020-10-18 LAB — BASIC METABOLIC PANEL
Anion gap: 8 (ref 5–15)
BUN: 27 mg/dL — ABNORMAL HIGH (ref 8–23)
CO2: 28 mmol/L (ref 22–32)
Calcium: 9.3 mg/dL (ref 8.9–10.3)
Chloride: 103 mmol/L (ref 98–111)
Creatinine, Ser: 1.47 mg/dL — ABNORMAL HIGH (ref 0.44–1.00)
GFR, Estimated: 35 mL/min — ABNORMAL LOW (ref >60)
Glucose, Bld: 92 mg/dL (ref 70–99)
Potassium: 4 mmol/L (ref 3.5–5.1)
Sodium: 139 mmol/L (ref 135–145)

## 2020-10-18 LAB — CBC WITH DIFFERENTIAL/PLATELET
Abs Immature Granulocytes: 0.01 10*3/uL (ref 0.00–0.07)
Basophils Absolute: 0 10*3/uL (ref 0.0–0.1)
Basophils Relative: 0 %
Eosinophils Absolute: 0.2 10*3/uL (ref 0.0–0.5)
Eosinophils Relative: 3 %
HCT: 23.4 % — ABNORMAL LOW (ref 36.0–46.0)
Hemoglobin: 7.6 g/dL — ABNORMAL LOW (ref 12.0–15.0)
Immature Granulocytes: 0 %
Lymphocytes Relative: 28 %
Lymphs Abs: 1.6 10*3/uL (ref 0.7–4.0)
MCH: 32.6 pg (ref 26.0–34.0)
MCHC: 32.5 g/dL (ref 30.0–36.0)
MCV: 100.4 fL — ABNORMAL HIGH (ref 80.0–100.0)
Monocytes Absolute: 0.5 10*3/uL (ref 0.1–1.0)
Monocytes Relative: 8 %
Neutro Abs: 3.6 10*3/uL (ref 1.7–7.7)
Neutrophils Relative %: 61 %
Platelets: 204 10*3/uL (ref 150–400)
RBC: 2.33 MIL/uL — ABNORMAL LOW (ref 3.87–5.11)
RDW: 14.2 % (ref 11.5–15.5)
WBC: 5.9 10*3/uL (ref 4.0–10.5)
nRBC: 0 % (ref 0.0–0.2)

## 2020-10-18 LAB — BRAIN NATRIURETIC PEPTIDE: B Natriuretic Peptide: 217.6 pg/mL — ABNORMAL HIGH (ref 0.0–100.0)

## 2020-10-18 IMAGING — DX DG CHEST 2V
2 series · 2 of 2 positions shown · non-contrast
Comparison: Chest x-ray [DATE].

CLINICAL DATA: Dyspnea.

EXAM:
CHEST - 2 VIEW

[chest pa]
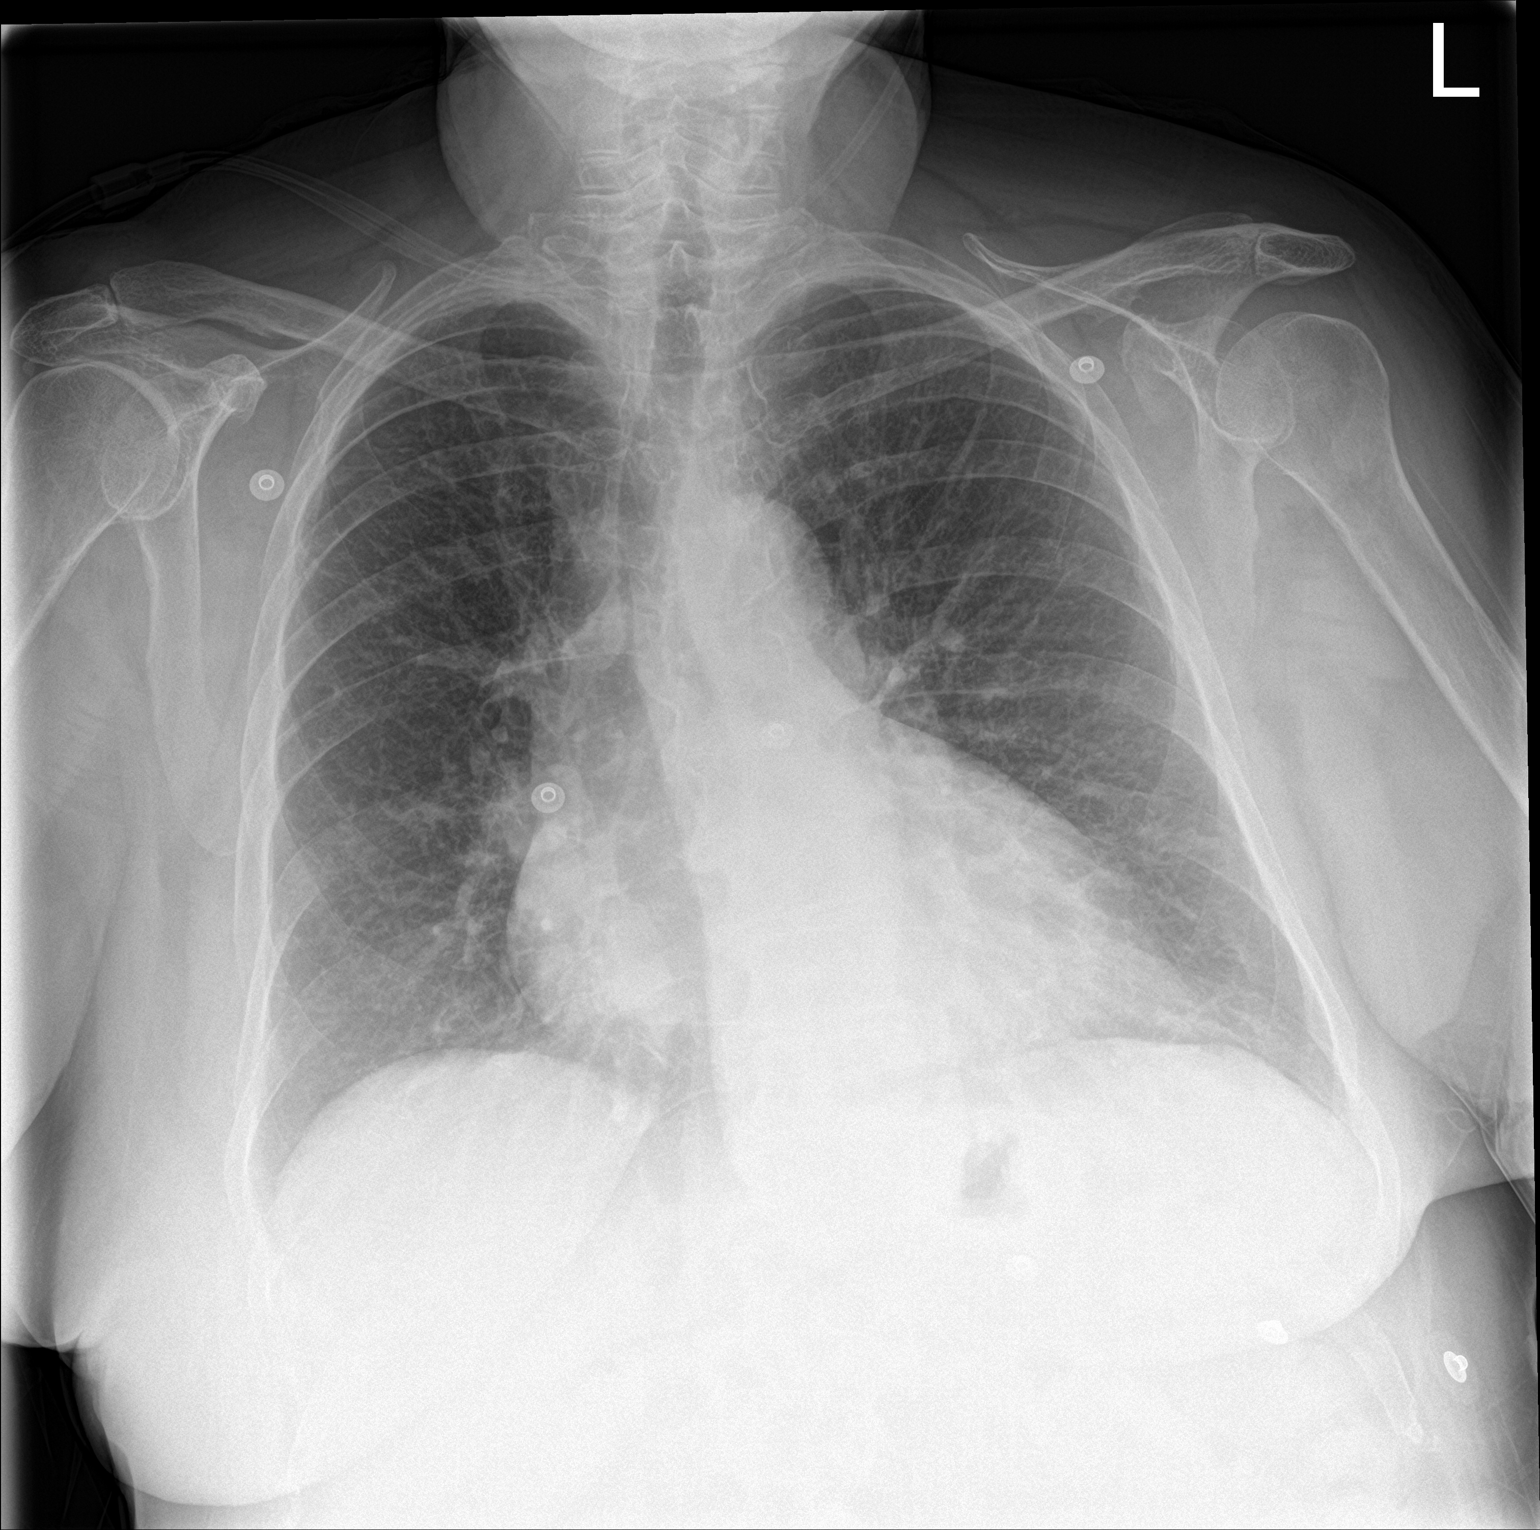

[chest lat]
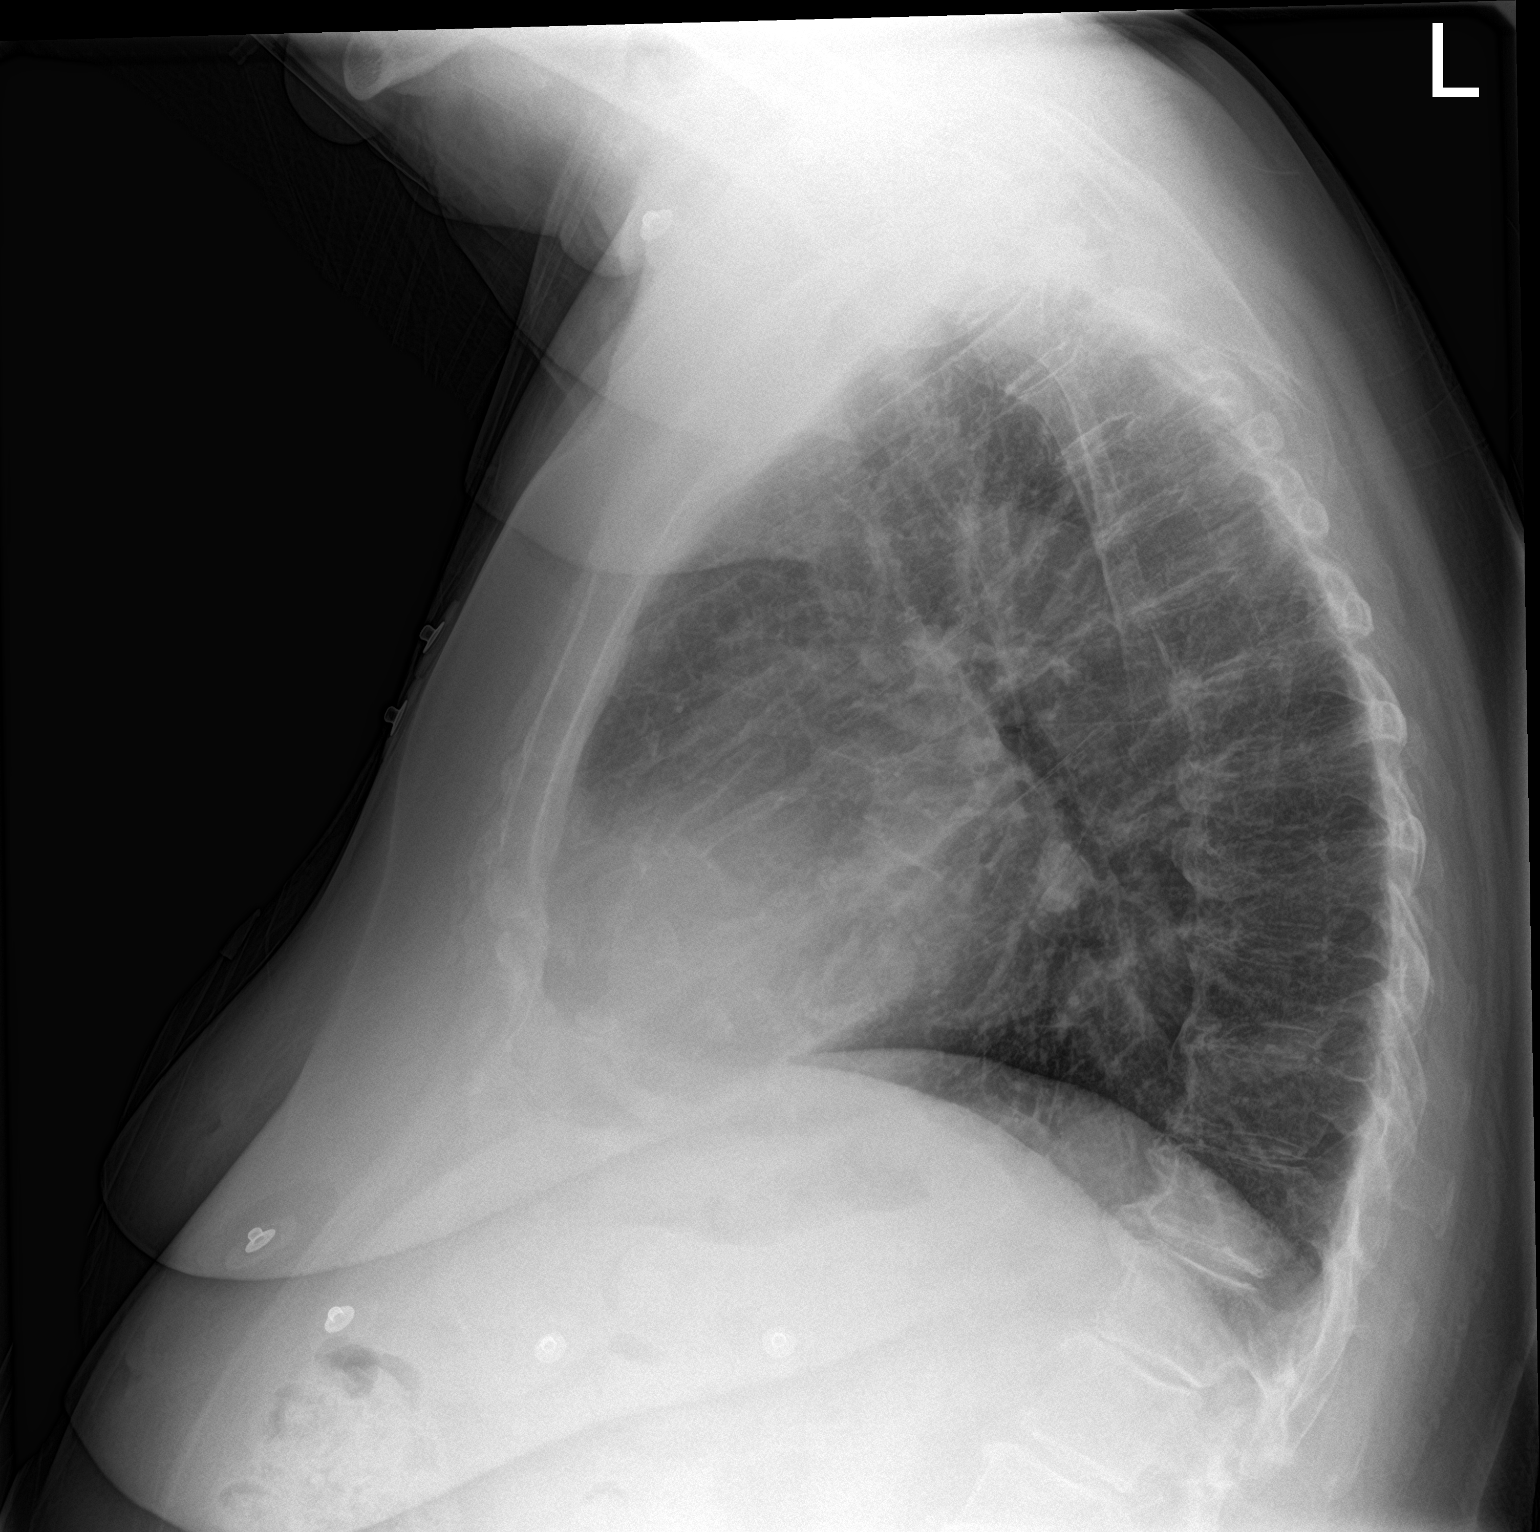

[2 of 2 positions shown; findings below may reference images not displayed]

FINDINGS: There are some patchy opacities in the right middle lobe/right
infrahilar region. The lungs otherwise appear clear. There is no
pleural effusion or pneumothorax. The heart is mildly enlarged,
unchanged. Mediastinal silhouette is within normal limits. No acute
fractures are seen. There are degenerative changes of the thoracic
spine.
IMPRESSION: 1. Minimal right middle lobe/infrahilar atelectasis/airspace
disease.
2. Recommend follow-up PA and lateral chest x-ray and 4-6 weeks to
confirm resolution.

## 2020-10-18 NOTE — ED Triage Notes (Signed)
Pt arrived by EMS c/o shortness of breath  Hx of CHF, pt is scheduled for a tavar procedure on sept 20th. Pt is working to get at home O2 but does not have any at this time. Pt on 2L [place] by EMS   Denies chest pain and feeling lightheaded  Pt does have headache, 5/10

## 2020-10-18 NOTE — ED Provider Notes (Signed)
Emergency Medicine Provider Triage Evaluation Note  Mary Mccarthy , a 80 y.o. female  was evaluated in triage.  Pt complains of shortness of breath.  States that she has been feeling short of breath since this morning.  She said that she is currently being evaluated for possible home oxygen use.  She has a history of CHF.  She was told that her shortness of breath gets worse that she should go to the emergency department.  She is also scheduled for TAVR in October for a heart valve problem.  She does have bilateral leg swelling.  She has no associated chest pain, abdominal pain, nausea, vomiting, dizziness.  Review of Systems  Positive: Leg swelling, shortness of breath Negative: Chest pain, abdominal pain, nausea, vomiting, dizziness  Physical Exam  BP (!) 183/52 (BP Location: Left Arm)   Pulse (!) 59   Temp 98.3 F (36.8 C) (Oral)   Resp 16   Ht 5\' 1"  (1.549 m)   Wt 90.3 kg   SpO2 100%   BMI 37.60 kg/m  Gen:   Awake, no distress Resp:  Normal effort.  Difficult to hear lung sounds due to body habitus. MSK:   Moves extremities without difficulty Other:  Heart with murmur, regular rate and rhythm.  1+ Bilateral lower extremity pitting edema  Medical Decision Making  Medically screening exam initiated at 5:53 PM.  Appropriate orders placed.  was informed that the remainder of the evaluation will be completed by another provider, this initial triage assessment does not replace that evaluation, and the importance of remaining in the ED until their evaluation is complete   Mary Scrape, PA-C 10/18/20 1757    12/18/20, MD 10/20/20 [phone]

## 2020-10-19 ENCOUNTER — Encounter (HOSPITAL_COMMUNITY): Payer: Self-pay | Admitting: Emergency Medicine

## 2020-10-19 DIAGNOSIS — N179 Acute kidney failure, unspecified: Secondary | ICD-10-CM | POA: Diagnosis not present

## 2020-10-19 DIAGNOSIS — E785 Hyperlipidemia, unspecified: Secondary | ICD-10-CM

## 2020-10-19 DIAGNOSIS — F419 Anxiety disorder, unspecified: Secondary | ICD-10-CM

## 2020-10-19 DIAGNOSIS — K922 Gastrointestinal hemorrhage, unspecified: Secondary | ICD-10-CM | POA: Diagnosis not present

## 2020-10-19 DIAGNOSIS — I350 Nonrheumatic aortic (valve) stenosis: Secondary | ICD-10-CM | POA: Diagnosis not present

## 2020-10-19 DIAGNOSIS — I5032 Chronic diastolic (congestive) heart failure: Secondary | ICD-10-CM | POA: Diagnosis not present

## 2020-10-19 DIAGNOSIS — I480 Paroxysmal atrial fibrillation: Secondary | ICD-10-CM

## 2020-10-19 DIAGNOSIS — I2510 Atherosclerotic heart disease of native coronary artery without angina pectoris: Secondary | ICD-10-CM

## 2020-10-19 DIAGNOSIS — D649 Anemia, unspecified: Secondary | ICD-10-CM | POA: Diagnosis present

## 2020-10-19 DIAGNOSIS — R0602 Shortness of breath: Secondary | ICD-10-CM | POA: Diagnosis not present

## 2020-10-19 DIAGNOSIS — I10 Essential (primary) hypertension: Secondary | ICD-10-CM

## 2020-10-19 DIAGNOSIS — E1165 Type 2 diabetes mellitus with hyperglycemia: Secondary | ICD-10-CM

## 2020-10-19 DIAGNOSIS — K2901 Acute gastritis with bleeding: Secondary | ICD-10-CM | POA: Diagnosis not present

## 2020-10-19 LAB — HEMOGLOBIN AND HEMATOCRIT, BLOOD
HCT: 25.8 % — ABNORMAL LOW (ref 36.0–46.0)
HCT: 26.8 % — ABNORMAL LOW (ref 36.0–46.0)
Hemoglobin: 8.5 g/dL — ABNORMAL LOW (ref 12.0–15.0)
Hemoglobin: 8.8 g/dL — ABNORMAL LOW (ref 12.0–15.0)

## 2020-10-19 LAB — CBG MONITORING, ED
Glucose-Capillary: 134 mg/dL — ABNORMAL HIGH (ref 70–99)
Glucose-Capillary: 177 mg/dL — ABNORMAL HIGH (ref 70–99)

## 2020-10-19 LAB — PROTIME-INR
INR: 1.3 — ABNORMAL HIGH (ref 0.8–1.2)
Prothrombin Time: 16.2 seconds — ABNORMAL HIGH (ref 11.4–15.2)

## 2020-10-19 LAB — PROCALCITONIN: Procalcitonin: <0.1 ng/mL

## 2020-10-19 LAB — POC OCCULT BLOOD, ED: Fecal Occult Bld: POSITIVE — AB

## 2020-10-19 LAB — RESP PANEL BY RT-PCR (FLU A&B, COVID) ARPGX2
Influenza A by PCR: NEGATIVE
Influenza B by PCR: NEGATIVE
SARS Coronavirus 2 by RT PCR: NEGATIVE

## 2020-10-19 LAB — LACTIC ACID, PLASMA: Lactic Acid, Venous: 1.3 mmol/L (ref 0.5–1.9)

## 2020-10-19 LAB — MAGNESIUM: Magnesium: 1.4 mg/dL — ABNORMAL LOW (ref 1.7–2.4)

## 2020-10-19 LAB — TROPONIN I (HIGH SENSITIVITY)
Troponin I (High Sensitivity): 45 ng/L — ABNORMAL HIGH (ref <18)
Troponin I (High Sensitivity): 45 ng/L — ABNORMAL HIGH (ref <18)

## 2020-10-19 LAB — PREPARE RBC (CROSSMATCH)

## 2020-10-19 MED ORDER — ATORVASTATIN CALCIUM 40 MG PO TABS
40.0000 mg | ORAL_TABLET | Freq: Every day | ORAL | Status: DC
  Administered 2020-10-19 – 2020-10-28 (×9): 40 mg via ORAL
  Filled 2020-10-19 (×10): qty 1

## 2020-10-19 MED ORDER — SODIUM CHLORIDE 0.9 % IV SOLN
10.0000 mL/h | Freq: Once | INTRAVENOUS | Status: AC
  Administered 2020-10-19: 10 mL/h via INTRAVENOUS

## 2020-10-19 MED ORDER — AMIODARONE HCL IN DEXTROSE 360-4.14 MG/200ML-% IV SOLN
60.0000 mg/h | INTRAVENOUS | Status: DC
  Administered 2020-10-19: 60 mg/h via INTRAVENOUS
  Filled 2020-10-19: qty 200

## 2020-10-19 MED ORDER — PANTOPRAZOLE SODIUM 40 MG IV SOLR: 40.0000 mg | Freq: Two times a day (BID) | INTRAVENOUS | Status: DC

## 2020-10-19 MED ORDER — CARVEDILOL 6.25 MG PO TABS
6.2500 mg | ORAL_TABLET | Freq: Two times a day (BID) | ORAL | Status: DC
  Administered 2020-10-19 – 2020-10-22 (×6): 6.25 mg via ORAL
  Filled 2020-10-19 (×4): qty 1
  Filled 2020-10-19 (×2): qty 2

## 2020-10-19 MED ORDER — METOCLOPRAMIDE HCL 5 MG/ML IJ SOLN: 5.0000 mg | Freq: Four times a day (QID) | INTRAMUSCULAR | Status: AC

## 2020-10-19 MED ORDER — PANTOPRAZOLE SODIUM 40 MG IV SOLR
40.0000 mg | Freq: Once | INTRAVENOUS | Status: AC
  Administered 2020-10-19: 40 mg via INTRAVENOUS
  Filled 2020-10-19: qty 40

## 2020-10-19 MED ORDER — INSULIN ASPART 100 UNIT/ML IJ SOLN
0.0000 [IU] | Freq: Every day | INTRAMUSCULAR | Status: DC
  Administered 2020-10-24: 2 [IU] via SUBCUTANEOUS

## 2020-10-19 MED ORDER — ALBUTEROL SULFATE (2.5 MG/3ML) 0.083% IN NEBU: 2.5000 mg | INHALATION_SOLUTION | Freq: Four times a day (QID) | RESPIRATORY_TRACT | Status: DC | PRN

## 2020-10-19 MED ORDER — MAGNESIUM SULFATE 2 GM/50ML IV SOLN
2.0000 g | Freq: Once | INTRAVENOUS | Status: AC
  Administered 2020-10-19: 2 g via INTRAVENOUS
  Filled 2020-10-19: qty 50

## 2020-10-19 MED ORDER — AMIODARONE HCL IN DEXTROSE 360-4.14 MG/200ML-% IV SOLN
30.0000 mg/h | INTRAVENOUS | Status: DC
Start: 2020-10-19 — End: 2020-10-21
  Administered 2020-10-19 – 2020-10-21 (×4): 30 mg/h via INTRAVENOUS
  Filled 2020-10-19 (×4): qty 200

## 2020-10-19 MED ORDER — SODIUM CHLORIDE 0.9% FLUSH
3.0000 mL | Freq: Two times a day (BID) | INTRAVENOUS | Status: DC
  Administered 2020-10-19 – 2020-10-28 (×15): 3 mL via INTRAVENOUS

## 2020-10-19 MED ORDER — PEG-KCL-NACL-NASULF-NA ASC-C 100 G PO SOLR: 1.0000 | Freq: Once | ORAL | Status: DC

## 2020-10-19 MED ORDER — ONDANSETRON HCL 4 MG/2ML IJ SOLN: 4.0000 mg | Freq: Four times a day (QID) | INTRAMUSCULAR | Status: DC | PRN

## 2020-10-19 MED ORDER — AMIODARONE LOAD VIA INFUSION
150.0000 mg | Freq: Once | INTRAVENOUS | Status: AC
  Administered 2020-10-19: 150 mg via INTRAVENOUS
  Filled 2020-10-19: qty 83.34

## 2020-10-19 MED ORDER — ONDANSETRON HCL 4 MG PO TABS: 4.0000 mg | ORAL_TABLET | Freq: Four times a day (QID) | ORAL | Status: DC | PRN

## 2020-10-19 MED ORDER — ACETAMINOPHEN 650 MG RE SUPP: 650.0000 mg | Freq: Four times a day (QID) | RECTAL | Status: DC | PRN

## 2020-10-19 MED ORDER — INSULIN ASPART 100 UNIT/ML IJ SOLN
0.0000 [IU] | Freq: Three times a day (TID) | INTRAMUSCULAR | Status: DC
  Administered 2020-10-20: 2 [IU] via SUBCUTANEOUS

## 2020-10-19 MED ORDER — BISACODYL 5 MG PO TBEC
20.0000 mg | DELAYED_RELEASE_TABLET | Freq: Once | ORAL | Status: AC
  Administered 2020-10-19: 20 mg via ORAL
  Filled 2020-10-19: qty 4

## 2020-10-19 MED ORDER — CARVEDILOL 3.125 MG PO TABS: 6.2500 mg | ORAL_TABLET | Freq: Two times a day (BID) | ORAL | Status: DC

## 2020-10-19 MED ORDER — ACETAMINOPHEN 325 MG PO TABS
650.0000 mg | ORAL_TABLET | Freq: Four times a day (QID) | ORAL | Status: DC | PRN
  Administered 2020-10-20 – 2020-10-24 (×5): 650 mg via ORAL
  Filled 2020-10-19 (×7): qty 2

## 2020-10-19 MED ORDER — ALPRAZOLAM 0.25 MG PO TABS
0.2500 mg | ORAL_TABLET | Freq: Three times a day (TID) | ORAL | Status: DC | PRN
  Administered 2020-10-19 – 2020-10-27 (×13): 0.25 mg via ORAL
  Filled 2020-10-19 (×14): qty 1

## 2020-10-19 MED ORDER — AMIODARONE HCL 200 MG PO TABS
400.0000 mg | ORAL_TABLET | Freq: Every day | ORAL | Status: DC
  Administered 2020-10-19: 400 mg via ORAL
  Filled 2020-10-19: qty 2

## 2020-10-19 MED ORDER — PANTOPRAZOLE INFUSION (NEW) - SIMPLE MED
8.0000 mg/h | INTRAVENOUS | Status: DC
  Administered 2020-10-19 – 2020-10-21 (×4): 8 mg/h via INTRAVENOUS
  Filled 2020-10-19 (×4): qty 80
  Filled 2020-10-19: qty 100

## 2020-10-19 NOTE — ED Notes (Signed)
Cardiology at bedside assessing pt at this time.  °

## 2020-10-19 NOTE — ED Notes (Signed)
Lunch tray ordered for pt.

## 2020-10-19 NOTE — H&P (Addendum)
History and Physical    Mary Mccarthy VHQ:[id number] DOB: 01-Jul-1940 DOA: 10/18/2020  Referring MD/NP/PA: Aletta Edouard, MD PCP: Biagio Borg, MD  Patient coming from: Home  Chief Complaint: Shortness of breath  I have personally briefly reviewed patient's old medical records in [place]   HPI: Mary Mccarthy is a 80 y.o. female with medical history significant of PAF on Eliquis hypertension, hyperlipidemia, AS, CVA, DM type II, PUD, and GERD presents with complaints of shortness of breath acutely worsened over the last 2-3 days.  She had just recently been hospitalized from 8/22-8/26 for acute on chronic diastolic congestive heart failure exacerbation found to have severe aortic stenosis.  Patient was able to be diuresed and underwent cardiac cath which showed nonobstructive CAD.  She reports that she had a follow-up appointment with Dr. Caffie Pinto tomorrow in regards to need of TAVR.  She reports that she Feeling as though she was unable to catch her breath.  Recently.  Noted yesterday her feet and legs had started swelling.  Since getting home she had noticed that her stools were dark black in color, but attributed it to antibiotics she had recently been on.  Other associated symptoms include complaints of headache, intermittent crampy mid abdominal pain, and nausea.  Denies having any cough, fever, chest pain, or vomiting.  Her last bowel movement was 2 days ago.  Patient has been on Eliquis as well as aspirin daily and last took the medications yesterday morning.  She also admits that she has intermittently been taking ibuprofen sometimes 2 or 3 times per day depending on when she gets a headache, and last took ibuprofen yesterday afternoon.  States she was never told not to take NSAIDs.  Patient also notes that she last had a bleed back in 2017 where EGD noted peptic ulcer disease and she had ablation of a right-sided colon AVM on colonoscopy.   ED Course: Upon admission into the  emergency department patient was seen to be afebrile with pulse 59-1 10, respirations 13-32, blood pressure 125/78-212/53, and O2 saturation currently maintained on 4 L nasal cannula oxygen.  Patient was never documented to be hypoxic and seems this was done due for comfort.  Labs significant for hemoglobin 7.6, BUN 27, creatinine 1.47, troponin 45, and INR 1.3.  Stool guaiacs were noted to be positive.  Blood cultures have been obtained.  [facility] have been formally consulted. Patient had been given Protonix 40 mg IV.   Review of Systems  Constitutional:  Positive for malaise/fatigue. Negative for fever.  HENT:  Negative for congestion.   Eyes:  Negative for photophobia and pain.  Respiratory:  Positive for shortness of breath.   Cardiovascular:  Positive for leg swelling. Negative for chest pain.  Gastrointestinal:  Positive for abdominal pain, constipation, melena and nausea. Negative for vomiting.  Genitourinary:  Positive for urgency. Negative for dysuria.  Musculoskeletal:  Negative for falls.  Skin:  Negative for rash.  Neurological:  Positive for dizziness, weakness and headaches. Negative for loss of consciousness.  Endo/Heme/Allergies:  Bruises/bleeds easily.  Psychiatric/Behavioral:  Negative for memory loss and substance abuse.    Past Medical History:  Diagnosis Date   ALLERGIC RHINITIS 06/24/2007   Anemia    AVM (arteriovenous malformation) of colon    Blood transfusion without reported diagnosis 05/2015   COLONIC POLYPS, HX OF 06/24/2007   Coronary artery disease, non-occlusive 05/2015   Trop + w/ Acute Anemia =>CATH: small RI - Ostial 60%, ostial RCA 30% and dLAD  40-50%;; 9/'21: Cor Ca Score 624. Mild (25-49%) prox RCA & LAD,; Moderate (50-69%) Ostial Small RI & prox LCx.     DEPRESSION 10/08/2006   DIABETES MELLITUS, TYPE II 10/05/2006   GERD 06/24/2007   History of CVA (cerebrovascular accident)    08/2020- found to be in afib with RVR, started on Eliquis    HYPERLIPIDEMIA 10/08/2006   HYPERTENSION 10/08/2006   INSOMNIA 09/24/2008   Left knee DJD    Osteoporosis 03/10/2016   PAF (paroxysmal atrial fibrillation) ([place])    PEPTIC ULCER DISEASE 10/08/2006   Severe aortic stenosis     Past Surgical History:  Procedure Laterality Date   APPENDECTOMY     BREAST BIOPSY     CARDIAC CATHETERIZATION N/A 05/21/2015   Procedure: Left Heart Cath and Coronary Angiography;  Surgeon: Leonie Man, MD;  Location: [facility];  Service: Cardiovascular;: Ost RI 60%, Ost RCA 30%, dLAD tapers to small vessel w/ 40-50%. Mildly elevated LVEDP. Normal LV Fxn.   COLONOSCOPY N/A 05/20/2015   Procedure: COLONOSCOPY;  Surgeon: Manus Gunning, MD;  Location: [facility];  Service: Gastroenterology;  Laterality: N/A;   CORONARY CA2+ SCORE / CARDIAC CT ANGIOGRAM  10/09/2019   Calcium score 624.  82nd percentile. Dominant RCA: Mild (25-49%) proximal stenosis-distal bifurcation into PDA and PAV--< RPL branches.  LAD (1 major mid vessel diagonal) diffuse calcified plaque, mild proximal stenosis with minimal distal stenosis.  Small RI moderate ostial disease.  LCx-moderate mixed (50-69%) proximal stenosis.  Small dOM1 disease.  [place] AoV, annular Ca2+ - probable AS   ESOPHAGOGASTRODUODENOSCOPY N/A 05/20/2015   Procedure: ESOPHAGOGASTRODUODENOSCOPY (EGD);  Surgeon: Manus Gunning, MD;  Location: [facility];  Service: Gastroenterology;  Laterality: N/A;   RIGHT/LEFT HEART CATH AND CORONARY ANGIOGRAPHY N/A 09/29/2020   Procedure: RIGHT/LEFT HEART CATH AND CORONARY ANGIOGRAPHY;  Surgeon: Burnell Blanks, MD;  Location: [facility];  Service: Cardiovascular;  Laterality: N/A;   TONSILLECTOMY     TRANSTHORACIC ECHOCARDIOGRAM  03/2019; 09/2019   a) EF 60 to 65%.  Moderate LVH.  GRII DD.  Mod-Severe AS (m grad 36 mmHg, peak 59 mmHg); b) EF 65-70%, No RWMA. Gr1 DD/hi LAP, Mild hi PAP. Mod LA Dil. MOD AS (mean Grad 34.5 mmHg).  STABLE     TRANSTHORACIC ECHOCARDIOGRAM  04/12/2017   EF 60-65%. No RWMA.  GR 1 DD.  Moderate concentric LVH.  Mild LA dilation. Severe calcified aortic valve with moderate-severe aortic stenosis (peak/mean gradients 60/34 mmHg) and in severe range by AVA (0.8 cm2).  Mild to moderately increased PA pressures 41 mmHg with normal RV size and function..     TUBAL LIGATION       reports that she has never smoked. She has never used smokeless tobacco. She reports that she does not drink alcohol and does not use drugs.  Allergies  Allergen Reactions   Ibuprofen Other (See Comments)    Bleeding events    Family History  Problem Relation Age of Onset   Alcohol abuse Other    Arthritis Other        DJD   Hyperlipidemia Other    Heart disease Other    Stroke Other    Hypertension Other    Depression Other    Diabetes Other    Cancer Mother        ENT cancer   CAD Sister        Several MI & PPM; long term smoker, EtOH   Heart attack Sister  32       several   Arrhythmia Sister        s/p PPM (Dr. Sallyanne Kuster)    Prior to Admission medications   Medication Sig Start Date End Date Taking? Authorizing Provider  acetaminophen (TYLENOL) 500 MG tablet Take 500 mg by mouth every 6 (six) hours as needed for moderate pain.    [provider]  ALPRAZolam Duanne Moron) 0.25 MG tablet Take 1 tablet (0.25 mg total) by mouth 3 (three) times daily as needed for anxiety. 10/06/20   Biagio Borg, MD  amiodarone (PACERONE) 200 MG tablet Take 2 tablets (400 mg total) by mouth daily. 10/12/20   Biagio Borg, MD  apixaban (ELIQUIS) 5 MG TABS tablet Take 1 tablet (5 mg total) by mouth 2 (two) times daily. 08/19/20   Biagio Borg, MD  aspirin 81 MG EC tablet Take 1 tablet (81 mg total) by mouth daily. Swallow whole. 10/01/20   Charlynne Cousins, MD  atorvastatin (LIPITOR) 40 MG tablet TAKE 1 TABLET BY MOUTH EVERY DAY Patient taking differently: Take 40 mg by mouth daily. 08/20/20   Leonie Man, MD  blood glucose  meter kit and supplies Dispense based on patient and insurance preference. Use up to four times daily as directed. E11.9 04/15/18   Biagio Borg, MD  Blood Glucose Monitoring Suppl (ONE TOUCH ULTRA 2) w/Device KIT Use as directed daily 06/01/15   Biagio Borg, MD  carvedilol (COREG) 6.25 MG tablet TAKE 1 TABLET BY MOUTH TWICE A DAY Patient taking differently: Take 6.25 mg by mouth 2 (two) times daily with a meal. 02/26/20   Leonie Man, MD  cyanocobalamin ([employer] VITAMIN B12) 1000 MCG tablet Take 1 tablet (1,000 mcg total) by mouth daily. 07/14/20   Biagio Borg, MD  furosemide (LASIX) 20 MG tablet Take 1 tablet (20 mg total) by mouth daily as needed. 10/13/20   Biagio Borg, MD  glimepiride (AMARYL) 2 MG tablet TAKE 1 TABLET BY MOUTH EVERY DAY BEFORE BREAKFAST Patient taking differently: Take 2 mg by mouth daily with breakfast. 09/16/20   Biagio Borg, MD  Lancets (ONETOUCH DELICA PLUS [id number]) [place] USE AS DIRECTED TEST 1-2 TIMES A DAY 09/05/20   Biagio Borg, MD  metFORMIN (GLUCOPHAGE) 1000 MG tablet TAKE 1 TAB BY MOUTH IN THE AM,AND 1/2 TAB IN THE PM Patient taking differently: Take 500-1,000 mg by mouth 2 (two) times daily with a meal. Take $RemoveBe'1000mg'QPIhZlCvr$  in the AM and 500 in the PM 07/14/20   Biagio Borg, MD  [facility] ULTRA test strip USE AS INSTRUCTED TEST 1-2 TIMES A DAY 10/12/20   Biagio Borg, MD  pantoprazole (PROTONIX) 40 MG tablet Take 1 tablet (40 mg total) by mouth daily. 07/14/20   Biagio Borg, MD    Physical Exam:  Constitutional: Elderly female currently in no acute distress Vitals:   10/19/20 0828 10/19/20 0830 10/19/20 0917 10/19/20 0922  BP:  107/64 (!) 160/55   Pulse: 90 (!) 104 (!) 110 81  Resp: 19 (!) $Remo'25 18 13  'ObIlD$ Temp:  97.9 F (36.6 C)    TempSrc:  Oral    SpO2: 100% 100% 100% 100%  Weight:      Height:       Eyes: PERRL, lids and conjunctivae normal ENMT: Mucous membranes are moist. Posterior pharynx clear of any exudate or lesions.   Neck: normal, supple, no masses, no  thyromegaly Respiratory: Normal respiratory effort without significant wheezes  or rhonchi appreciated.  O2 saturations currently maintained on 4 L.  Patient able to talk in complete sentences. Cardiovascular: Bradycardia with +8/9  systolic ejection murmur appreciated.  Trace lower extremity edema.  2+ pedal pulses. No carotid bruits.  Abdomen: no tenderness, no masses palpated. No hepatosplenomegaly. Bowel sounds positive.  Musculoskeletal: no clubbing / cyanosis. No joint deformity upper and lower extremities. Good ROM, no contractures. Normal muscle tone.  Skin: Pallor present Neurologic: CN 2-12 grossly intact. Sensation intact, DTR normal. Strength 5/5 in all 4.  Psychiatric: Normal judgment and insight. Alert and oriented x 3. Normal mood.     Labs on Admission: I have personally reviewed following labs and imaging studies  CBC: Recent Labs  Lab 10/18/20 1753  WBC 5.9  NEUTROABS 3.6  HGB 7.6*  HCT 23.4*  MCV 100.4*  PLT 211   Basic Metabolic Panel: Recent Labs  Lab 10/13/20 1418 10/18/20 1753  NA 139 139  K 4.0 4.0  CL 103 103  CO2 28 28  GLUCOSE 86 92  BUN 24* 27*  CREATININE 1.18 1.47*  CALCIUM 9.8 9.3   GFR: Estimated Creatinine Clearance: 30.2 mL/min (A) (by C-G formula based on SCr of 1.47 mg/dL (H)). Liver Function Tests: No results for input(s): AST, ALT, ALKPHOS, BILITOT, PROT, ALBUMIN in the last 168 hours. No results for input(s): LIPASE, AMYLASE in the last 168 hours. No results for input(s): AMMONIA in the last 168 hours. Coagulation Profile: No results for input(s): INR, PROTIME in the last 168 hours. Cardiac Enzymes: No results for input(s): CKTOTAL, CKMB, CKMBINDEX, TROPONINI in the last 168 hours. BNP (last 3 results) No results for input(s): PROBNP in the last 8760 hours. HbA1C: No results for input(s): HGBA1C in the last 72 hours. CBG: No results for input(s): GLUCAP in the last 168 hours. Lipid Profile: No results for input(s): CHOL,  HDL, LDLCALC, TRIG, CHOLHDL, LDLDIRECT in the last 72 hours. Thyroid Function Tests: No results for input(s): TSH, T4TOTAL, FREET4, T3FREE, THYROIDAB in the last 72 hours. Anemia Panel: No results for input(s): VITAMINB12, FOLATE, FERRITIN, TIBC, IRON, RETICCTPCT in the last 72 hours. Urine analysis:    Component Value Date/Time   COLORURINE YELLOW 08/10/2020 0433   APPEARANCEUR CLEAR 08/10/2020 0433   LABSPEC 1.013 08/10/2020 0433   PHURINE 5.0 08/10/2020 0433   GLUCOSEU >=500 (A) 08/10/2020 0433   GLUCOSEU NEGATIVE 03/18/2020 1125   [place] 08/10/2020 0433   BILIRUBINUR NEGATIVE 08/10/2020 0433   KETONESUR NEGATIVE 08/10/2020 0433   PROTEINUR NEGATIVE 08/10/2020 0433   UROBILINOGEN 1.0 03/18/2020 1125   NITRITE NEGATIVE 08/10/2020 0433   LEUKOCYTESUR NEGATIVE 08/10/2020 0433   Sepsis Labs: No results found for this or any previous visit (from the past 240 hour(s)).   Radiological Exams on Admission: DG Chest 2 View  Result Date: 10/18/2020 CLINICAL DATA:  Dyspnea. EXAM: CHEST - 2 VIEW COMPARISON:  Chest x-ray 10/06/2020. FINDINGS: There are some patchy opacities in the right middle lobe/right infrahilar region. The lungs otherwise appear clear. There is no pleural effusion or pneumothorax. The heart is mildly enlarged, unchanged. Mediastinal silhouette is within normal limits. No acute fractures are seen. There are degenerative changes of the thoracic spine. IMPRESSION: 1. Minimal right middle lobe/infrahilar atelectasis/airspace disease. 2. Recommend follow-up PA and lateral chest x-ray and 4-6 weeks to confirm resolution. Electronically Signed   By: Ronney Asters M.D.   On: 10/18/2020 19:54    EKG: Independently reviewed.  Sinus bradycardia at 59 bpm   Assessment/Plan Symptomatic anemia  secondary to GI bleed: Patient presents with complaints of dyspnea.  Hemoglobin down to 7.6 g/dL, but previously had been 10.6 g/dL on 8/31.  Stool guaiacs were noted to be positive.   Anemia studies in July were negative for deficiency iron, folate, and B12.  Patient has previous history of PUD.  She had been typed and screened for possible need of blood product given Protonix 40 mg IV.  Risk factors for bleeding include prior history of bleeding secondary to PUD. Patient had been on Eliquis and aspirin which she last took yesterday morning, but had also been using ibuprofen intermittently. -Admit to a medical telemetry bed -Monitor intake and output -Hold Eliquis and aspirin -Continue with transfusion 1 unit of PRBCs -Protonix drip -Serial monitoring of H&H -Counseled the patient on the need to refrain from using NSAIDs -Appreciate GI consultative services, will follow up for any further recommendation  Acute kidney injury superimposed on chronic kidney disease stage IIIb: Patient presents with creatinine elevated up to 1.47 with BUN 27.  Baseline creatinine previously had been around 0.9-1.1.  Suspect likely secondary with acute blood loss and/or NSAID use. -Check urinalysis -Avoid nephrotoxic agents  Paroxysmal atrial fibrillation on chronic anticoagulation: Patient appears to be in sinus rhythm at this time.  She reports last taking Eliquis yesterday morning. -Hold Eliquis -Continue amiodarone  Abnormal chest x-ray: Patient denies any complaints of fever, cough, or vomiting.  Chest x-ray noted to have minimal right middle lobe and interlobar atelectasis /airspace disease.  Suspect more likely atelectasis in nature. -Check procalcitonin  Chronic diastolic CHF severe aortic stenosis: Patient appears to be relatively euvolemic at this time.  Echocardiogram noted EF 55 to 60% with severe aortic stenosis appreciated. -Strict intake and output -Daily weights -Consider Lasix if needed for signs of fluid overload -Message sent to Dr. Mohammed Kindle to notify him of the patient being admitted to the hospital.  CAD: Patient had recent heart cath which noted nonobstructive disease  last month. -Held aspirin -Continues to  Essential hypertension: Home medication regimen includes Coreg 6.25 mg twice daily and furosemide 20 mg as needed for swelling. -Continue Coreg  Diabetes mellitus type 2: Last hemoglobin A1c was 7.4 on 08/10/2020 Home medication regimen includes Amaryl 2 mg daily along with metformin 1000 mg every morning and 500 mg nightly. -Hypoglycemic protocols -Hold metformin and Amaryl -CBGs before every meal with sensitive SSI  Hyperlipidemia -Continue atorvastatin  Anxiety -Continue Xanax as needed  GERD history of PUD and AVM: Patient had been on Protonix 40 mg daily. -Protonix as seen above  Obesity: BMI 37.6 kg/m  DVT prophylaxis: SCDs  Code Status: Full Family Communication: Family to be updated Disposition Plan: Likely discharge home once medically stable Consults called: GI Admission status: Observation  Norval Morton MD [facility]   If 7PM-7AM, please contact night-coverage   10/19/2020, 9:43 AM

## 2020-10-19 NOTE — ED Notes (Signed)
Dr. Butler at bedside assessing pt at this time.  

## 2020-10-19 NOTE — ED Notes (Signed)
Secure chat sent to GI and hospitalist regarding cardiologist request to postpone moviprep until pt has been cleared by cardiology for EGD/colonoscopy. Katrinka Blazing MD and Nicole Kindred MD expressed understanding via secure chat.

## 2020-10-19 NOTE — ED Notes (Signed)
Pt noted to be having frequent runs of V-tach on monitor. Per MD, give Coreg now.

## 2020-10-19 NOTE — ED Provider Notes (Signed)
[facility] EMERGENCY DEPARTMENT Provider Note   CSN: [id number] Arrival date & time: 10/18/20  1729     History Chief Complaint  Patient presents with   Shortness of Breath    Mary Mccarthy is a 80 y.o. female.  She has a history of paroxysmal A. fib on anticoagulation.  Also has aortic stenosis and is expected to get a TAVR this month.  Complaining of 1 month of increased shortness of breath.  is to be evaluated for possible home oxygen use.  Increased shortness of breath over the last 2 days.  No chest pain but is feeling her heart fluttering.  No fevers or chills.  No vomiting or diarrhea.  Has required blood transfusion in the past, has not seen any bleeding, but did states she had some dark stools.  She thought it was due to the antibiotics they put her on for a cold she had a couple of weeks ago  The history is provided by the patient.  Shortness of Breath Severity:  Severe Onset quality:  Gradual Duration:  1 month Timing:  Intermittent Progression:  Worsening Chronicity:  New Context: activity   Relieved by:  Nothing Worsened by:  Activity Ineffective treatments:  Rest and sitting up Associated symptoms: no abdominal pain, no chest pain, no cough, no fever, no headaches, no neck pain, no rash, no sore throat, no sputum production and no vomiting   Risk factors: no tobacco use       Past Medical History:  Diagnosis Date   ALLERGIC RHINITIS 06/24/2007   Anemia    AVM (arteriovenous malformation) of colon    Blood transfusion without reported diagnosis 05/2015   COLONIC POLYPS, HX OF 06/24/2007   Coronary artery disease, non-occlusive 05/2015   Trop + w/ Acute Anemia =>CATH: small RI - Ostial 60%, ostial RCA 30% and dLAD 40-50%;; 9/'21: Cor Ca Score 624. Mild (25-49%) prox RCA & LAD,; Moderate (50-69%) Ostial Small RI & prox LCx.     DEPRESSION 10/08/2006   DIABETES MELLITUS, TYPE II 10/05/2006   GERD 06/24/2007   History of CVA (cerebrovascular  accident)    08/2020- found to be in afib with RVR, started on Eliquis   HYPERLIPIDEMIA 10/08/2006   HYPERTENSION 10/08/2006   INSOMNIA 09/24/2008   Left knee DJD    Osteoporosis 03/10/2016   PAF (paroxysmal atrial fibrillation) ([place])    PEPTIC ULCER DISEASE 10/08/2006   Severe aortic stenosis     Patient Active Problem List   Diagnosis Date Noted   Acute on chronic diastolic CHF (congestive heart failure) ([place]) 09/27/2020   Acute bronchitis 09/21/2020   Paroxysmal atrial fibrillation ([place]) 08/23/2020   Secondary hypercoagulable state ([place]) 08/23/2020   Atrial fibrillation with RVR ([place]) 08/11/2020   History of CVA (cerebrovascular accident) 08/10/2020   Hypomagnesemia 08/10/2020   Aortic atherosclerosis ([place]) 03/18/2020   Chest pain at rest 09/22/2019   Vitamin D deficiency 09/21/2019   B12 deficiency 09/21/2019   COVID-19 virus vaccination not done 03/06/2019   Cough 03/14/2018   Wheezing 03/14/2018   Anxiety 09/11/2017   Severe aortic stenosis 03/14/2017   Osteoporosis 03/10/2016   Coronary artery disease, non-occlusive 05/24/2015   Pulmonary hypertension (H[facility])    Chronic diastolic heart failure ([place]) 05/20/2015   Gastric ulceration    AVM (arteriovenous malformation) of colon    Demand ischemia of myocardium ([place])    Viral illness 11/20/2014   Right knee pain 05/21/2014   Anemia, iron deficiency 05/20/2013   Encounter for  well adult exam with abnormal findings 06/19/2010   MENOPAUSAL DISORDER 12/07/2009   INSOMNIA 09/24/2008   ALLERGIC RHINITIS 06/24/2007   GERD 06/24/2007   COLONIC POLYPS, HX OF 06/24/2007   Hyperlipidemia with target LDL less than 70 10/08/2006   Depression 10/08/2006   Essential hypertension 10/08/2006   PEPTIC ULCER DISEASE 10/08/2006   Type 2 diabetes mellitus with hyperglycemia, without long-term current use of insulin ([place]) 10/05/2006   Morbid obesity ([place]) 10/05/2006    Past Surgical History:  Procedure Laterality Date   APPENDECTOMY      BREAST BIOPSY     CARDIAC CATHETERIZATION N/A 05/21/2015   Procedure: Left Heart Cath and Coronary Angiography;  Surgeon: Leonie Man, MD;  Location: [facility];  Service: Cardiovascular;: Ost RI 60%, Ost RCA 30%, dLAD tapers to small vessel w/ 40-50%. Mildly elevated LVEDP. Normal LV Fxn.   COLONOSCOPY N/A 05/20/2015   Procedure: COLONOSCOPY;  Surgeon: Manus Gunning, MD;  Location: [facility];  Service: Gastroenterology;  Laterality: N/A;   CORONARY CA2+ SCORE / CARDIAC CT ANGIOGRAM  10/09/2019   Calcium score 624.  82nd percentile. Dominant RCA: Mild (25-49%) proximal stenosis-distal bifurcation into PDA and PAV--< RPL branches.  LAD (1 major mid vessel diagonal) diffuse calcified plaque, mild proximal stenosis with minimal distal stenosis.  Small RI moderate ostial disease.  LCx-moderate mixed (50-69%) proximal stenosis.  Small dOM1 disease.  [place] AoV, annular Ca2+ - probable AS   ESOPHAGOGASTRODUODENOSCOPY N/A 05/20/2015   Procedure: ESOPHAGOGASTRODUODENOSCOPY (EGD);  Surgeon: Manus Gunning, MD;  Location: [facility];  Service: Gastroenterology;  Laterality: N/A;   RIGHT/LEFT HEART CATH AND CORONARY ANGIOGRAPHY N/A 09/29/2020   Procedure: RIGHT/LEFT HEART CATH AND CORONARY ANGIOGRAPHY;  Surgeon: Burnell Blanks, MD;  Location: [facility];  Service: Cardiovascular;  Laterality: N/A;   TONSILLECTOMY     TRANSTHORACIC ECHOCARDIOGRAM  03/2019; 09/2019   a) EF 60 to 65%.  Moderate LVH.  GRII DD.  Mod-Severe AS (m grad 36 mmHg, peak 59 mmHg); b) EF 65-70%, No RWMA. Gr1 DD/hi LAP, Mild hi PAP. Mod LA Dil. MOD AS (mean Grad 34.5 mmHg).  STABLE    TRANSTHORACIC ECHOCARDIOGRAM  04/12/2017   EF 60-65%. No RWMA.  GR 1 DD.  Moderate concentric LVH.  Mild LA dilation. Severe calcified aortic valve with moderate-severe aortic stenosis (peak/mean gradients 60/34 mmHg) and in severe range by AVA (0.8 cm2).  Mild to moderately increased PA pressures 41 mmHg  with normal RV size and function..     TUBAL LIGATION       OB History   No obstetric history on file.     Family History  Problem Relation Age of Onset   Alcohol abuse Other    Arthritis Other        DJD   Hyperlipidemia Other    Heart disease Other    Stroke Other    Hypertension Other    Depression Other    Diabetes Other    Cancer Mother        ENT cancer   CAD Sister        Several MI & PPM; long term smoker, EtOH   Heart attack Sister 63       several   Arrhythmia Sister        s/p PPM (Dr. Sallyanne Kuster)    Social History   Tobacco Use   Smoking status: Never   Smokeless tobacco: Never  Substance Use Topics   Alcohol use: No  Alcohol/week: 0.0 standard drinks   Drug use: No    Home Medications Prior to Admission medications   Medication Sig Start Date End Date Taking? Authorizing Provider  acetaminophen (TYLENOL) 500 MG tablet Take 500 mg by mouth every 6 (six) hours as needed for moderate pain.    [provider]  ALPRAZolam Duanne Moron) 0.25 MG tablet Take 1 tablet (0.25 mg total) by mouth 3 (three) times daily as needed for anxiety. 10/06/20   Biagio Borg, MD  amiodarone (PACERONE) 200 MG tablet Take 2 tablets (400 mg total) by mouth daily. 10/12/20   Biagio Borg, MD  apixaban (ELIQUIS) 5 MG TABS tablet Take 1 tablet (5 mg total) by mouth 2 (two) times daily. 08/19/20   Biagio Borg, MD  aspirin 81 MG EC tablet Take 1 tablet (81 mg total) by mouth daily. Swallow whole. 10/01/20   Charlynne Cousins, MD  atorvastatin (LIPITOR) 40 MG tablet TAKE 1 TABLET BY MOUTH EVERY DAY Patient taking differently: Take 40 mg by mouth daily. 08/20/20   Leonie Man, MD  blood glucose meter kit and supplies Dispense based on patient and insurance preference. Use up to four times daily as directed. E11.9 04/15/18   Biagio Borg, MD  Blood Glucose Monitoring Suppl (ONE TOUCH ULTRA 2) w/Device KIT Use as directed daily 06/01/15   Biagio Borg, MD  carvedilol (COREG)  6.25 MG tablet TAKE 1 TABLET BY MOUTH TWICE A DAY Patient taking differently: Take 6.25 mg by mouth 2 (two) times daily with a meal. 02/26/20   Leonie Man, MD  cyanocobalamin ([employer] VITAMIN B12) 1000 MCG tablet Take 1 tablet (1,000 mcg total) by mouth daily. 07/14/20   Biagio Borg, MD  furosemide (LASIX) 20 MG tablet Take 1 tablet (20 mg total) by mouth daily as needed. 10/13/20   Biagio Borg, MD  glimepiride (AMARYL) 2 MG tablet TAKE 1 TABLET BY MOUTH EVERY DAY BEFORE BREAKFAST Patient taking differently: Take 2 mg by mouth daily with breakfast. 09/16/20   Biagio Borg, MD  Lancets (ONETOUCH DELICA PLUS [id number]) [place] USE AS DIRECTED TEST 1-2 TIMES A DAY 09/05/20   Biagio Borg, MD  metFORMIN (GLUCOPHAGE) 1000 MG tablet TAKE 1 TAB BY MOUTH IN THE AM,AND 1/2 TAB IN THE PM Patient taking differently: Take 500-1,000 mg by mouth 2 (two) times daily with a meal. Take 1026m in the AM and 500 in the PM 07/14/20   JBiagio Borg MD  OSan Diego Eye Cor IncULTRA test strip USE AS INSTRUCTED TEST 1-2 TIMES A DAY 10/12/20   JBiagio Borg MD  pantoprazole (PROTONIX) 40 MG tablet Take 1 tablet (40 mg total) by mouth daily. 07/14/20   JBiagio Borg MD    Allergies    Ibuprofen  Review of Systems   Review of Systems  Constitutional:  Positive for fatigue. Negative for fever.  HENT:  Negative for sore throat.   Eyes:  Negative for visual disturbance.  Respiratory:  Positive for shortness of breath. Negative for cough and sputum production.   Cardiovascular:  Positive for palpitations. Negative for chest pain.  Gastrointestinal:  Negative for abdominal pain and vomiting.  Genitourinary:  Negative for dysuria.  Musculoskeletal:  Negative for neck pain.  Skin:  Negative for rash.  Neurological:  Negative for headaches.   Physical Exam Updated Vital Signs BP (!) 166/63   Pulse (!) 104   Temp 98.5 F (36.9 C)   Resp (!) 25   Ht  _0  (1.549 m)   Wt 90.3 kg   SpO2 100%   BMI 37.60 kg/m   Physical  Exam Vitals and nursing note reviewed.  Constitutional:      General: She is not in acute distress.    Appearance: She is well-developed.  HENT:     Head: Normocephalic and atraumatic.  Eyes:     Conjunctiva/sclera: Conjunctivae normal.  Cardiovascular:     Rate and Rhythm: Tachycardia present. Rhythm irregular.     Heart sounds: Murmur heard.  Pulmonary:     Effort: Pulmonary effort is normal. Tachypnea present. No respiratory distress.     Breath sounds: Normal breath sounds.  Abdominal:     Palpations: Abdomen is soft.     Tenderness: There is no abdominal tenderness.  Musculoskeletal:     Cervical back: Neck supple.     Right lower leg: No tenderness. No edema.     Left lower leg: No tenderness. No edema.  Skin:    General: Skin is warm and dry.     Capillary Refill: Capillary refill takes less than 2 seconds.  Neurological:     General: No focal deficit present.     Mental Status: She is alert.    ED Results / Procedures / Treatments   Labs (all labs ordered are listed, but only abnormal results are displayed) Labs Reviewed  BASIC METABOLIC PANEL - Abnormal; Notable for the following components:      Result Value   BUN 27 (*)    Creatinine, Ser 1.47 (*)    GFR, Estimated 35 (*)    All other components within normal limits  CBC WITH DIFFERENTIAL/PLATELET - Abnormal; Notable for the following components:   RBC 2.33 (*)    Hemoglobin 7.6 (*)    HCT 23.4 (*)    MCV 100.4 (*)    All other components within normal limits  BRAIN NATRIURETIC PEPTIDE - Abnormal; Notable for the following components:   B Natriuretic Peptide 217.6 (*)    All other components within normal limits  PROTIME-INR - Abnormal; Notable for the following components:   Prothrombin Time 16.2 (*)    INR 1.3 (*)    All other components within normal limits  HEMOGLOBIN AND HEMATOCRIT, BLOOD - Abnormal; Notable for the following components:   Hemoglobin 8.5 (*)    HCT 25.8 (*)    All other  components within normal limits  MAGNESIUM - Abnormal; Notable for the following components:   Magnesium 1.4 (*)    All other components within normal limits  POC OCCULT BLOOD, ED - Abnormal; Notable for the following components:   Fecal Occult Bld POSITIVE (*)    All other components within normal limits  CBG MONITORING, ED - Abnormal; Notable for the following components:   Glucose-Capillary 134 (*)    All other components within normal limits  TROPONIN I (HIGH SENSITIVITY) - Abnormal; Notable for the following components:   Troponin I (High Sensitivity) 45 (*)    All other components within normal limits  TROPONIN I (HIGH SENSITIVITY) - Abnormal; Notable for the following components:   Troponin I (High Sensitivity) 45 (*)    All other components within normal limits  RESP PANEL BY RT-PCR (FLU A&B, COVID) ARPGX2  CULTURE, BLOOD (ROUTINE X 2)  CULTURE, BLOOD (ROUTINE X 2)  LACTIC ACID, PLASMA  PROCALCITONIN  LACTIC ACID, PLASMA  HEMOGLOBIN AND HEMATOCRIT, BLOOD  URINALYSIS, ROUTINE W REFLEX MICROSCOPIC  CBC  BASIC METABOLIC PANEL  TYPE AND SCREEN  PREPARE RBC (CROSSMATCH)    EKG EKG Interpretation  Date/Time:  Monday October 18 2020 17:27:25 EDT Ventricular Rate:  59 PR Interval:  148 QRS Duration: 94 QT Interval:  456 QTC Calculation: 451 R Axis:   30 Text Interpretation: Sinus bradycardia ST & T wave abnormality, consider lateral ischemia Abnormal ECG No significant change since prior 8/22 Confirmed by Aletta Edouard [phone]) on 10/19/2020 9:03:41 AM  Radiology DG Chest 2 View  Result Date: 10/18/2020 CLINICAL DATA:  Dyspnea. EXAM: CHEST - 2 VIEW COMPARISON:  Chest x-ray 10/06/2020. FINDINGS: There are some patchy opacities in the right middle lobe/right infrahilar region. The lungs otherwise appear clear. There is no pleural effusion or pneumothorax. The heart is mildly enlarged, unchanged. Mediastinal silhouette is within normal limits. No acute fractures are seen.  There are degenerative changes of the thoracic spine. IMPRESSION: 1. Minimal right middle lobe/infrahilar atelectasis/airspace disease. 2. Recommend follow-up PA and lateral chest x-ray and 4-6 weeks to confirm resolution. Electronically Signed   By: Ronney Asters M.D.   On: 10/18/2020 19:54    Procedures .Critical Care Performed by: Hayden Rasmussen, MD Authorized by: Hayden Rasmussen, MD   Critical care provider statement:    Critical care time (minutes):  45   Critical care time was exclusive of:  Separately billable procedures and treating other patients   Critical care was necessary to treat or prevent imminent or life-threatening deterioration of the following conditions:  Circulatory failure and respiratory failure   Critical care was time spent personally by me on the following activities:  Discussions with consultants, evaluation of patient's response to treatment, examination of patient, ordering and performing treatments and interventions, ordering and review of laboratory studies, ordering and review of radiographic studies, pulse oximetry, re-evaluation of patient's condition, obtaining history from patient or surrogate, review of old charts and development of treatment plan with patient or surrogate   Medications Ordered in ED Medications  sodium chloride flush (NS) 0.9 % injection 3 mL (3 mLs Intravenous Given 10/19/20 1250)  acetaminophen (TYLENOL) tablet 650 mg (has no administration in time range)    Or  acetaminophen (TYLENOL) suppository 650 mg (has no administration in time range)  ondansetron (ZOFRAN) tablet 4 mg (has no administration in time range)    Or  ondansetron (ZOFRAN) injection 4 mg (has no administration in time range)  albuterol (PROVENTIL) (2.5 MG/3ML) 0.083% nebulizer solution 2.5 mg (has no administration in time range)  pantoprozole (PROTONIX) 80 mg /NS 100 mL infusion (0 mg/hr Intravenous Paused 10/19/20 1738)  pantoprazole (PROTONIX) injection 40 mg  (has no administration in time range)  atorvastatin (LIPITOR) tablet 40 mg (40 mg Oral Given 10/19/20 1251)  ALPRAZolam (XANAX) tablet 0.25 mg (0.25 mg Oral Given 10/19/20 1251)  insulin aspart (novoLOG) injection 0-9 Units (0 Units Subcutaneous Not Given 10/19/20 1729)  insulin aspart (novoLOG) injection 0-5 Units (has no administration in time range)  carvedilol (COREG) tablet 6.25 mg (6.25 mg Oral Given 10/19/20 1346)  peg 3350 powder (MOVIPREP) kit 200 g (0 g Oral Hold 10/19/20 1729)  metoCLOPramide (REGLAN) injection 5 mg (has no administration in time range)  amiodarone (NEXTERONE) 1.8 mg/mL load via infusion 150 mg (150 mg Intravenous Bolus from Bag 10/19/20 1741)    Followed by  amiodarone (NEXTERONE PREMIX) 360-4.14 MG/200ML-% (1.8 mg/mL) IV infusion (has no administration in time range)    Followed by  amiodarone (NEXTERONE PREMIX) 360-4.14 MG/200ML-% (1.8 mg/mL) IV infusion (has no administration in time range)  magnesium sulfate IVPB  2 g 50 mL (2 g Intravenous New Bag/Given 10/19/20 1739)  0.9 %  sodium chloride infusion (0 mL/hr Intravenous Stopped 10/19/20 1233)  pantoprazole (PROTONIX) injection 40 mg (40 mg Intravenous Given 10/19/20 1250)  bisacodyl (DULCOLAX) EC tablet 20 mg (20 mg Oral Given 10/19/20 1540)    ED Course  I have reviewed the triage vital signs and the nursing notes.  Pertinent labs & imaging results that were available during my care of the patient were reviewed by me and considered in my medical decision making (see chart for details).  Clinical Course as of 10/19/20 1812  Tue Oct 19, 2020  0919 Rectal exam done with nurse as chaperone.  Dark stool.  Sent to lab for guaiac.  She is occult positive. [MB]  [id number] Patient having tachycardia and tachypnea whenever she readjusts her self in the bed.  Appears to be in A. fib. [MB]  [phone] Discussed with [facility] PA Gribbin who will see in consult. [MB]  0945 Discussed with Dr. Tamala Julian from [facility] who will  evaluate the patient for admission. [MB]    Clinical Course User Index [MB] Hayden Rasmussen, MD   MDM Rules/Calculators/A&P                          This patient complains of some breath fatigue; this involves an extensive number of treatment Options and is a complaint that carries with it a high risk of complications and Morbidity. The differential includes anemia, ACS, pneumonia, COVID, heart failure  I ordered, reviewed and interpreted labs, which included CBC with normal white count, hemoglobin lower than priors, chemistries with mild elevation of creatinine, BNP mildly elevated, fecal occult positive, COVID testing negative I ordered medication IV fluids IV packed red blood cells I ordered imaging studies which included chest x-ray and I independently    visualized and interpreted imaging which showed atelectasis versus infiltrate right lung Additional history obtained from none Previous records obtained and reviewed in epic including prior admissions I consulted Dr. Tamala Julian [facility] and PA Gribbin from [facility] and discussed lab and imaging findings  Critical Interventions: Transfusion of packed red blood cells for symptomatic anemia and identification of GI bleed  After the interventions stated above, I reevaluated the patient and found patient to be reasonably comfortable when not exerting self.  She is agreeable to admission and transfusion. [name] was evaluated in Emergency Department on 10/19/2020 for the symptoms described in the history of present illness. She was evaluated in the context of the global COVID-19 pandemic, which necessitated consideration that the patient might be at risk for infection with the SARS-CoV-2 virus that causes COVID-19. Institutional protocols and algorithms that pertain to the evaluation of patients at risk for COVID-19 are in a state of rapid change based on information released by regulatory bodies including the CDC and federal and  state organizations. These policies and algorithms were followed during the patient's care in the ED.  CHA2DS2/VAS Stroke Risk Points  Current as of 29 minutes ago     9 >= 2 Points: High Risk  1 - 1.99 Points: Medium Risk  0 Points: Low Risk    No Change      Details    This score determines the patient's risk of having a stroke if the  patient has atrial fibrillation.       Points Metrics  1 Has Congestive Heart Failure:  Yes    Current as  of 29 minutes ago  1 Has Vascular Disease:  Yes    Current as of 29 minutes ago  1 Has Hypertension:  Yes    Current as of 29 minutes ago  2 Age:  15    Current as of 29 minutes ago  1 Has Diabetes:  Yes    Current as of 29 minutes ago  2 Had Stroke:  No  Had TIA:  Yes  Had Thromboembolism:  No    Current as of 29 minutes ago  1 Female:  Yes    Current as of 29 minutes ago            Final Clinical Impression(s) / ED Diagnoses Final diagnoses:  Dyspnea on exertion  Symptomatic anemia  Aortic valve stenosis, etiology of cardiac valve disease unspecified  Gastrointestinal hemorrhage, unspecified gastrointestinal hemorrhage type  AKI (acute kidney injury) ([place])    Rx / DC Orders ED Discharge Orders     None        Hayden Rasmussen, MD 10/19/20 1816

## 2020-10-19 NOTE — Consult Note (Signed)
[facility] Consult: 11:23 AM 10/19/2020  LOS: 0 days    Referring Provider: Dr Melina Copa in ED  Primary Care Physician:  Biagio Borg, MD Primary Gastroenterologist:  Dr. Havery Moros    Reason for Consultation: Symptomatic anemia.   HPI: Mary Mccarthy is a 80 y.o. female.  PMH chronic anemia dating back to at least 2015.  Remotely took oral iron but discontinued this due to diarrhea.  Required multiple PRBCs, parenteral iron in 05/2015 when Hgb 5, MCV 73.  FOBT negative then.  Also contributing to anemia is CKD.   In early July 2022 iron, TIBC, iron sats, ferritin, folate, B12 were all at or above normal. Other non-GI, nonhematologic issues include severe aortic stenosis, DM 2.  CVA 08/2020 w new diagnosis A. fib/RVR.  Now on chronic Eliquis.  Nonocclusive CAD.  Being worked up as a candidate for TAVR. Prior surgeries include but not limited to appendectomy, tubal ligation.  12/2009 colonoscopy for polyp surveillance (?2009?) 05/2015 colonoscopy. For IDA.  None bleeding AVMs x2 in ascending colon, cecum, ablated with APC.  Moderate, nonbleeding internal hemorrhoids.  Otherwise normal study to terminal ileum 05/2015 EGD.  For IDA.  Nonbleeding, linear gastric ulcers, no stigmata of bleeding.  Gastritis.  Stomach biopsied.  Normal duodenum to D2.  Overall gastric ulcers and gastritis are likely because of anemia in addition to possible prior bleeding from AVMs.  No high risk stigmata of bleeding appreciated. 07/2015 EGD for follow-up of bleeding ulcer.  Irregular Z-line less than 0.5 cm, no nodularity, no biopsies taken.  Patchy gastric antral erythema, H. pylori biopsies obtained.  Previous ulcers healed.  Examined duodenum to D2, normal. 09/28/2020 CTAP angio: for TAVR screening: Thickened aortic valve consistent with  stenosis.  5 mm nodule in RML.  Trace bilateral pleural effusions.  Aortic atherosclerosis, left main and three-vessel CAD.  Severe calcification mitral valve annulus.  Liver, biliary tree, pancreas, spleen, stomach, large and small intestine unremarkable  Admission in 8/22-8/26 for acute on chronic diastolic heart failure at which time the severe aortic stenosis was noted and underwent cardiac cath showing nonobstructive CAD.  Started 09/21/2020 on doxycycline, Tessalon Perles for COVID-negative acute bronchitis.  About 10 days ago stool started to become black but formed and sometimes hard to pass.  No gross blood.  No abdominal pain.  When she needs it, she takes acetaminophen for pain management and does not use NSAIDs.  Last dose of Eliquis was at 9 AM on 10/18/2020.  Her appetite is good.  No nausea, vomiting, reflux symptoms, dysphagia, abdominal pain.  Presented to the ED yesterday evening with 2 days of increased dyspnea, being evaluated for potential home oxygen.   Hgb 7.6, down from around 10.5 2 to 3 weeks ago.  MCV 100, was 95 in late August. Worsening renal function with current GFR 35, was 58 2 weeks ago, 43 5 days ago.  BNP 217.  Troponin I 45.  Normal lactic acid.  INR 1.3.  FOBT positive 2V CXR: Minor RML/infrahilar atelectasis/airspace disease.  Family history positive for MI or heart  problems and all of her brothers.  Father died of an MI in his mid to late 90s.  No family history of colorectal cancer, anemia, ulcer disease.        Past Medical History:  Diagnosis Date   ALLERGIC RHINITIS 06/24/2007   Anemia    AVM (arteriovenous malformation) of colon    Blood transfusion without reported diagnosis 05/2015   COLONIC POLYPS, HX OF 06/24/2007   Coronary artery disease, non-occlusive 05/2015   Trop + w/ Acute Anemia =>CATH: small RI - Ostial 60%, ostial RCA 30% and dLAD 40-50%;; 9/'21: Cor Ca Score 624. Mild (25-49%) prox RCA & LAD,; Moderate (50-69%) Ostial Small RI & prox  LCx.     DEPRESSION 10/08/2006   DIABETES MELLITUS, TYPE II 10/05/2006   GERD 06/24/2007   History of CVA (cerebrovascular accident)    08/2020- found to be in afib with RVR, started on Eliquis   HYPERLIPIDEMIA 10/08/2006   HYPERTENSION 10/08/2006   INSOMNIA 09/24/2008   Left knee DJD    Osteoporosis 03/10/2016   PAF (paroxysmal atrial fibrillation) ([place])    PEPTIC ULCER DISEASE 10/08/2006   Severe aortic stenosis     Past Surgical History:  Procedure Laterality Date   APPENDECTOMY     BREAST BIOPSY     CARDIAC CATHETERIZATION N/A 05/21/2015   Procedure: Left Heart Cath and Coronary Angiography;  Surgeon: Leonie Man, MD;  Location: [facility];  Service: Cardiovascular;: Ost RI 60%, Ost RCA 30%, dLAD tapers to small vessel w/ 40-50%. Mildly elevated LVEDP. Normal LV Fxn.   COLONOSCOPY N/A 05/20/2015   Procedure: COLONOSCOPY;  Surgeon: Manus Gunning, MD;  Location: [facility];  Service: Gastroenterology;  Laterality: N/A;   CORONARY CA2+ SCORE / CARDIAC CT ANGIOGRAM  10/09/2019   Calcium score 624.  82nd percentile. Dominant RCA: Mild (25-49%) proximal stenosis-distal bifurcation into PDA and PAV--< RPL branches.  LAD (1 major mid vessel diagonal) diffuse calcified plaque, mild proximal stenosis with minimal distal stenosis.  Small RI moderate ostial disease.  LCx-moderate mixed (50-69%) proximal stenosis.  Small dOM1 disease.  Trileaflet AoV, annular Ca2+ - probable AS   ESOPHAGOGASTRODUODENOSCOPY N/A 05/20/2015   Procedure: ESOPHAGOGASTRODUODENOSCOPY (EGD);  Surgeon: Manus Gunning, MD;  Location: [facility];  Service: Gastroenterology;  Laterality: N/A;   RIGHT/LEFT HEART CATH AND CORONARY ANGIOGRAPHY N/A 09/29/2020   Procedure: RIGHT/LEFT HEART CATH AND CORONARY ANGIOGRAPHY;  Surgeon: Burnell Blanks, MD;  Location: [facility];  Service: Cardiovascular;  Laterality: N/A;   TONSILLECTOMY     TRANSTHORACIC ECHOCARDIOGRAM  03/2019; 09/2019    a) EF 60 to 65%.  Moderate LVH.  GRII DD.  Mod-Severe AS (m grad 36 mmHg, peak 59 mmHg); b) EF 65-70%, No RWMA. Gr1 DD/hi LAP, Mild hi PAP. Mod LA Dil. MOD AS (mean Grad 34.5 mmHg).  STABLE    TRANSTHORACIC ECHOCARDIOGRAM  04/12/2017   EF 60-65%. No RWMA.  GR 1 DD.  Moderate concentric LVH.  Mild LA dilation. Severe calcified aortic valve with moderate-severe aortic stenosis (peak/mean gradients 60/34 mmHg) and in severe range by AVA (0.8 cm2).  Mild to moderately increased PA pressures 41 mmHg with normal RV size and function..     TUBAL LIGATION      Prior to Admission medications   Medication Sig Start Date End Date Taking? Authorizing Provider  acetaminophen (TYLENOL) 500 MG tablet Take 500 mg by mouth every 6 (six) hours as needed for moderate pain.    [provider]  ALPRAZolam (XANAX) 0.25 MG tablet Take 1 tablet (0.25 mg total) by mouth 3 (three) times daily as needed for anxiety. 10/06/20   Biagio Borg, MD  amiodarone (PACERONE) 200 MG tablet Take 2 tablets (400 mg total) by mouth daily. 10/12/20   Biagio Borg, MD  apixaban (ELIQUIS) 5 MG TABS tablet Take 1 tablet (5 mg total) by mouth 2 (two) times daily. 08/19/20   Biagio Borg, MD  aspirin 81 MG EC tablet Take 1 tablet (81 mg total) by mouth daily. Swallow whole. 10/01/20   Charlynne Cousins, MD  atorvastatin (LIPITOR) 40 MG tablet TAKE 1 TABLET BY MOUTH EVERY DAY Patient taking differently: Take 40 mg by mouth daily. 08/20/20   Leonie Man, MD  blood glucose meter kit and supplies Dispense based on patient and insurance preference. Use up to four times daily as directed. E11.9 04/15/18   Biagio Borg, MD  Blood Glucose Monitoring Suppl (ONE TOUCH ULTRA 2) w/Device KIT Use as directed daily 06/01/15   Biagio Borg, MD  carvedilol (COREG) 6.25 MG tablet TAKE 1 TABLET BY MOUTH TWICE A DAY Patient taking differently: Take 6.25 mg by mouth 2 (two) times daily with a meal. 02/26/20   Leonie Man, MD  cyanocobalamin ([employer]  VITAMIN B12) 1000 MCG tablet Take 1 tablet (1,000 mcg total) by mouth daily. 07/14/20   Biagio Borg, MD  furosemide (LASIX) 20 MG tablet Take 1 tablet (20 mg total) by mouth daily as needed. 10/13/20   Biagio Borg, MD  glimepiride (AMARYL) 2 MG tablet TAKE 1 TABLET BY MOUTH EVERY DAY BEFORE BREAKFAST Patient taking differently: Take 2 mg by mouth daily with breakfast. 09/16/20   Biagio Borg, MD  Lancets (ONETOUCH DELICA PLUS [id number]) [place] USE AS DIRECTED TEST 1-2 TIMES A DAY 09/05/20   Biagio Borg, MD  metFORMIN (GLUCOPHAGE) 1000 MG tablet TAKE 1 TAB BY MOUTH IN THE AM,AND 1/2 TAB IN THE PM Patient taking differently: Take 500-1,000 mg by mouth 2 (two) times daily with a meal. Take 1052m in the AM and 500 in the PM 07/14/20   JBiagio Borg MD  OD[facility] LLCULTRA test strip USE AS INSTRUCTED TEST 1-2 TIMES A DAY 10/12/20   JBiagio Borg MD  pantoprazole (PROTONIX) 40 MG tablet Take 1 tablet (40 mg total) by mouth daily. 07/14/20   JBiagio Borg MD    Scheduled Meds:  pantoprazole (PROTONIX) IV  40 mg Intravenous Once   [START ON 10/22/2020] pantoprazole  40 mg Intravenous Q12H   sodium chloride flush  3 mL Intravenous Q12H   Infusions:  sodium chloride     pantoprazole     PRN Meds: acetaminophen **OR** acetaminophen, albuterol, ondansetron **OR** ondansetron (ZOFRAN) IV   Allergies as of 10/18/2020 - Review Complete 10/18/2020  Allergen Reaction Noted   Ibuprofen Other (See Comments)     Family History  Problem Relation Age of Onset   Alcohol abuse Other    Arthritis Other        DJD   Hyperlipidemia Other    Heart disease Other    Stroke Other    Hypertension Other    Depression Other    Diabetes Other    Cancer Mother        ENT cancer   CAD Sister        Several MI & PPM; long term smoker, EtOH   Heart attack Sister 525  several   Arrhythmia Sister        s/p PPM (Dr. Sallyanne Kuster)    Social History   Socioeconomic History   Marital status: Widowed    Spouse  name: Not on file   Number of children: 1   Years of education: Not on file   Highest education level: Not on file  Occupational History   Occupation: retired - Social research officer, government: RETIRED  Tobacco Use   Smoking status: Never   Smokeless tobacco: Never  Substance and Sexual Activity   Alcohol use: No    Alcohol/week: 0.0 standard drinks   Drug use: No   Sexual activity: Not on file  Other Topics Concern   Not on file  Social History Narrative   Recently widowed--husband died in December 07, 2018.      Now lives alone but her son usually stays with her at night.  During the day she has 2 nephews who live nearby to come in and check on her intermittently.  Also one of her nieces calls routinely.      When her son is home and awake, she may try to walk on a treadmill, but is scared to walk outside.   Social Determinants of Health   Financial Resource Strain: Not on file  Food Insecurity: Not on file  Transportation Needs: Not on file  Physical Activity: Not on file  Stress: Not on file  Social Connections: Not on file  Intimate Partner Violence: Not on file    REVIEW OF SYSTEMS: Constitutional: Fatigues easily. ENT:  No nose bleeds Pulm: Dyspnea on exertion, not at rest. CV:  No palpitations.  Improved LE edema.  No chest pain. GU:  No hematuria, no frequency GI: No dysphagia.  No heartburn.  Good appetite. Heme: Bruises easily but no large hematomas and no bleeding from her skin or orifices. Transfusions: See HPI. Neuro:  No headaches, no peripheral tingling or numbness Derm:  No itching, no rash or sores.  Endocrine:  No sweats or chills.  No polyuria or dysuria Immunization: Reviewed. Travel:  None beyond local counties in last few months.    PHYSICAL EXAM: Vital signs in last 24 hours: Vitals:   10/19/20 1045 10/19/20 1056  BP: (!) 107/48 (!) 140/93  Pulse: 66 76  Resp: 19 19  Temp:  98 F (36.7 C)  SpO2: 100% 100%   Wt Readings from Last 3  Encounters:  10/18/20 90.3 kg  10/13/20 90.3 kg  10/06/20 91.2 kg    General: Overweight, pleasant, comfortable.  Looks somewhat chronically ill but not acutely ill. Head: No facial asymmetry or swelling.  No signs of head trauma. Eyes: Conjunctiva pink.  No scleral icterus Ears: Not hard of hearing Nose: No congestion or discharge Mouth: Oral mucosa is moist, pink, clear.  Tongue midline. Neck: No JVD, no masses, no thyromegaly Lungs: Clear bilaterally with reduced breath sounds in the bases.  No labored breathing.  No cough Heart: RRR with harsh murmur.  S1-S2 present. Abdomen: Not tender.  Soft.  Active bowel sounds.  Not distended.  No HSM, masses, bruits, hernias..   Rectal: Did not repeat DRE performed by ED staff where stool was submitted to lab and tested FOBT positive. Musc/Skeltl: No joint redness, swelling or gross deformity. Extremities: Very slight, nonpitting lower extremity edema. Neurologic: Good historian.  Alert.  Oriented x3.  Moves all 4 limbs without gross deficits, strength not tested.  No tremors. Skin: No telangiectasia, no sores.  Some purpura, bruising on the arms Nodes: No cervical adenopathy Psych: Calm, pleasant, cooperative, fluid speech.  Intake/Output from previous day: No intake/output data recorded. Intake/Output this shift: No intake/output data recorded.  LAB RESULTS: Recent Labs    10/18/20 1753  WBC 5.9  HGB 7.6*  HCT 23.4*  PLT 204   BMET Lab Results  Component Value Date   NA 139 10/18/2020   NA 139 10/13/2020   NA 138 10/06/2020   K 4.0 10/18/2020   K 4.0 10/13/2020   K 4.4 10/06/2020   CL 103 10/18/2020   CL 103 10/13/2020   CL 104 10/06/2020   CO2 28 10/18/2020   CO2 28 10/13/2020   CO2 28 10/06/2020   GLUCOSE 92 10/18/2020   GLUCOSE 86 10/13/2020   GLUCOSE 87 10/06/2020   BUN 27 (H) 10/18/2020   BUN 24 (H) 10/13/2020   BUN 18 10/06/2020   CREATININE 1.47 (H) 10/18/2020   CREATININE 1.18 10/13/2020   CREATININE  0.92 10/06/2020   CALCIUM 9.3 10/18/2020   CALCIUM 9.8 10/13/2020   CALCIUM 10.6 (H) 10/06/2020   LFT No results for input(s): PROT, ALBUMIN, AST, ALT, ALKPHOS, BILITOT, BILIDIR, IBILI in the last 72 hours. PT/INR Lab Results  Component Value Date   INR 1.3 (H) 10/19/2020   INR 1.1 08/10/2020   INR 1.18 05/21/2015   Hepatitis Panel No results for input(s): HEPBSAG, HCVAB, HEPAIGM, HEPBIGM in the last 72 hours. C-Diff No components found for: CDIFF Lipase  No results found for: LIPASE  Drugs of Abuse     Component Value Date/Time   LABOPIA NONE DETECTED 08/10/2020 0433   COCAINSCRNUR NONE DETECTED 08/10/2020 0433   LABBENZ NONE DETECTED 08/10/2020 0433   AMPHETMU NONE DETECTED 08/10/2020 0433   THCU NONE DETECTED 08/10/2020 0433   LABBARB NONE DETECTED 08/10/2020 0433     RADIOLOGY STUDIES: DG Chest 2 View  Result Date: 10/18/2020 CLINICAL DATA:  Dyspnea. EXAM: CHEST - 2 VIEW COMPARISON:  Chest x-ray 10/06/2020. FINDINGS: There are some patchy opacities in the right middle lobe/right infrahilar region. The lungs otherwise appear clear. There is no pleural effusion or pneumothorax. The heart is mildly enlarged, unchanged. Mediastinal silhouette is within normal limits. No acute fractures are seen. There are degenerative changes of the thoracic spine. IMPRESSION: 1. Minimal right middle lobe/infrahilar atelectasis/airspace disease. 2. Recommend follow-up PA and lateral chest x-ray and 4-6 weeks to confirm resolution. Electronically Signed   By: Ronney Asters M.D.   On: 10/18/2020 19:54      IMPRESSION:      FOBT positive dark stools with macrocytic anemia.  Hgb drop of about 3 g over 3 weeks.  Anemia studies in July negative for iron, folate, B12 deficiency.   Receiving current transfusion with 1 of 1 PRBCs.  Previous gastric ulcers which had healed on follow-up EGD 2017.  Ablation of nonbleeding right sided colon AVMs on colonoscopy 05/2015.  Remote history of colon polyps, not  clear if hyperplastic or adenomatous.  At home takes Protonix 40 mg/daily.  Severe aortic stenosis with recent admission for heart failure.  Being worked up for TAVR but date for surgery not set.    PLAN:     Okay to feed patient for now.  Asked cardiology to see the patient.  Dr. Lorenso Courier would like to pursue colonoscopy, EGD as soon as tomorrow.  Probably does not need Protonix drip in place but will leave that running until Dr. Lorenso Courier evaluates patient.   Azucena Freed  10/19/2020,  11:23 AM Phone [phone]

## 2020-10-19 NOTE — Consult Note (Addendum)
Cardiology Consultation:   Patient ID: Mary Mccarthy MRN: [id number]; DOB: 1940/05/09  Admit date: 10/18/2020 Date of Consult: 10/19/2020  PCP:  Biagio Borg, MD   [facility] Providers Cardiologist:  Glenetta Hew, MD   Patient Profile:   Mary Mccarthy is a 80 y.o. female with a hx of moderate-severe AS, non obstructive CAD, paroxysmal afib on Eliquis, HTN, CVA, HLD, DM, PUD/AVM and Chronic diastolic HF at who is being seen 10/19/2020 for the evaluation of preop evaluation at the request of Dr. Tamala Julian.  History of Present Illness:   [name] is an 80 yo female with PMH noted above.  She has been followed by Dr. Ellyn Hack as an outpatient.  She was admitted 08/2020 with stroke felt to be embolic in nature.  Initial plan was to place loop recorder, but review of ECGs noted atrial fibrillation on tracing from July 06/2020, therefore loop recorder was no longer indicated. She was started on Eliquis at that time.   She was most recently admitted 09/2020 with acute on chronic CHF in the setting of aortic stenosis.  She presented with 2 to 3 months of progressive congestion, cough and dyspnea on exertion.  Had been treated with outpatient antibiotics without significant improvement.  Noted significant lower extremity edema along with orthopnea and PND.  States she been using several pillows to sleep at night.  Noted to have elevated BNP and initially required BiPAP on admission.  She was diuresed with IV Lasix with significant improvement in symptoms.  She was evaluated by Dr. Burt Knack during that admission and felt appropriate to undergo right and left heart catheterization to consider for TAVR evaluation.  Cardiac catheterization on 09/29/2020 showed mild nonobstructive CAD with severe aortic stenosis with mean gradient of 52.8 mmHg, peak to peak gradient of 58 mmHg and AVA of 0.90 cm2.  Did develop atrial fibrillation with RVR with placed on amiodarone drip with conversion to sinus rhythm.  She was  transitioned to amiodarone 400 mg daily to be continued until the time of her TAVR. Notes indicated she was scheduled to see Dr. Cyndia Bent in the office tomorrow in regards to her TAVR work up.   Presented to the ED on 9/12 with complaints of progressive dyspnea, increased lower extremity edema and dark tarry stools.   Admission labs showed sodium 139, potassium 4, creatinine 1.47, BNP 217, lactic acid 1.3, WBC 5.9, hemoglobin 7.6 and INR 1.3.  EKG showed sinus bradycardia 59 bpm, no acute ST/T wave changes.  Chest x-ray with minimal right middle lobe atelectasis.  FOBT positive.  She was admitted to internal medicine with GI consulted.  Evaluated by [facility] who are recommending colonoscopy/EGD.  Does have history of previous gastric ulcers which were healed on follow-up EGD in 2017.  She is also had a ablation of nonbleeding right-sided colon AVM on colonoscopy 2017.  She has been placed on a Protonix drip and Eliquis held appropriately.  Cardiology is now asked to evaluate for preoperative clearance prior to procedure.  At the time of assessment she denies any chest pain, but feels hot and short of breath. In review of telemetry she has been in afib RVR since this morning when she was placed in a room around 8:30am.    Past Medical History:  Diagnosis Date   ALLERGIC RHINITIS 06/24/2007   Anemia    AVM (arteriovenous malformation) of colon    Blood transfusion without reported diagnosis 05/2015   COLONIC POLYPS, HX OF 06/24/2007   Coronary artery disease,  non-occlusive 05/2015   Trop + w/ Acute Anemia =>CATH: small RI - Ostial 60%, ostial RCA 30% and dLAD 40-50%;; 9/'21: Cor Ca Score 624. Mild (25-49%) prox RCA & LAD,; Moderate (50-69%) Ostial Small RI & prox LCx.     DEPRESSION 10/08/2006   DIABETES MELLITUS, TYPE II 10/05/2006   GERD 06/24/2007   History of CVA (cerebrovascular accident)    08/2020- found to be in afib with RVR, started on Eliquis   HYPERLIPIDEMIA 10/08/2006   HYPERTENSION  10/08/2006   INSOMNIA 09/24/2008   Left knee DJD    Osteoporosis 03/10/2016   PAF (paroxysmal atrial fibrillation) ([place])    PEPTIC ULCER DISEASE 10/08/2006   Severe aortic stenosis     Past Surgical History:  Procedure Laterality Date   APPENDECTOMY     BREAST BIOPSY     CARDIAC CATHETERIZATION N/A 05/21/2015   Procedure: Left Heart Cath and Coronary Angiography;  Surgeon: Leonie Man, MD;  Location: [place] [facility];  Service: Cardiovascular;: Ost RI 60%, Ost RCA 30%, dLAD tapers to small vessel w/ 40-50%. Mildly elevated LVEDP. Normal LV Fxn.   COLONOSCOPY N/A 05/20/2015   Procedure: COLONOSCOPY;  Surgeon: Manus Gunning, MD;  Location: [place];  Service: Gastroenterology;  Laterality: N/A;   CORONARY CA2+ SCORE / CARDIAC CT ANGIOGRAM  10/09/2019   Calcium score 624.  82nd percentile. Dominant RCA: Mild (25-49%) proximal stenosis-distal bifurcation into PDA and PAV--< RPL branches.  LAD (1 major mid vessel diagonal) diffuse calcified plaque, mild proximal stenosis with minimal distal stenosis.  Small RI moderate ostial disease.  LCx-moderate mixed (50-69%) proximal stenosis.  Small dOM1 disease.  Trileaflet AoV, annular Ca2+ - probable AS   ESOPHAGOGASTRODUODENOSCOPY N/A 05/20/2015   Procedure: ESOPHAGOGASTRODUODENOSCOPY (EGD);  Surgeon: Manus Gunning, MD;  Location: [place];  Service: Gastroenterology;  Laterality: N/A;   RIGHT/LEFT HEART CATH AND CORONARY ANGIOGRAPHY N/A 09/29/2020   Procedure: RIGHT/LEFT HEART CATH AND CORONARY ANGIOGRAPHY;  Surgeon: Burnell Blanks, MD;  Location: [place] [facility];  Service: Cardiovascular;  Laterality: N/A;   TONSILLECTOMY     TRANSTHORACIC ECHOCARDIOGRAM  03/2019; 09/2019   a) EF 60 to 65%.  Moderate LVH.  GRII DD.  Mod-Severe AS (m grad 36 mmHg, peak 59 mmHg); b) EF 65-70%, No RWMA. Gr1 DD/hi LAP, Mild hi PAP. Mod LA Dil. MOD AS (mean Grad 34.5 mmHg).  STABLE    TRANSTHORACIC ECHOCARDIOGRAM  04/12/2017   EF  60-65%. No RWMA.  GR 1 DD.  Moderate concentric LVH.  Mild LA dilation. Severe calcified aortic valve with moderate-severe aortic stenosis (peak/mean gradients 60/34 mmHg) and in severe range by AVA (0.8 cm2).  Mild to moderately increased PA pressures 41 mmHg with normal RV size and function..     TUBAL LIGATION       Home Medications:  Prior to Admission medications   Medication Sig Start Date End Date Taking? Authorizing Provider  acetaminophen (TYLENOL) 500 MG tablet Take 500 mg by mouth every 6 (six) hours as needed for moderate pain.   Yes [provider]  ALPRAZolam (XANAX) 0.25 MG tablet Take 1 tablet (0.25 mg total) by mouth 3 (three) times daily as needed for anxiety. 10/06/20  Yes Biagio Borg, MD  amiodarone (PACERONE) 200 MG tablet Take 2 tablets (400 mg total) by mouth daily. 10/12/20  Yes Biagio Borg, MD  apixaban (ELIQUIS) 5 MG TABS tablet Take 1 tablet (5 mg total) by mouth 2 (two) times daily. 08/19/20  Yes John,  Hunt Oris, MD  aspirin 81 MG EC tablet Take 1 tablet (81 mg total) by mouth daily. Swallow whole. 10/01/20  Yes Charlynne Cousins, MD  atorvastatin (LIPITOR) 40 MG tablet TAKE 1 TABLET BY MOUTH EVERY DAY Patient taking differently: Take 40 mg by mouth daily. 08/20/20  Yes Leonie Man, MD  carvedilol (COREG) 6.25 MG tablet TAKE 1 TABLET BY MOUTH TWICE A DAY Patient taking differently: Take 6.25 mg by mouth 2 (two) times daily with a meal. 02/26/20  Yes Leonie Man, MD  cyanocobalamin ([employer] VITAMIN B12) 1000 MCG tablet Take 1 tablet (1,000 mcg total) by mouth daily. 07/14/20  Yes Biagio Borg, MD  glimepiride (AMARYL) 2 MG tablet TAKE 1 TABLET BY MOUTH EVERY DAY BEFORE BREAKFAST Patient taking differently: Take 2 mg by mouth daily with breakfast. 09/16/20  Yes Biagio Borg, MD  metFORMIN (GLUCOPHAGE) 1000 MG tablet TAKE 1 TAB BY MOUTH IN THE AM,AND 1/2 TAB IN THE PM Patient taking differently: Take 500-1,000 mg by mouth 2 (two) times daily with a meal. Take  $Rem'1000mg'WQVU$  in the AM and 500 in the PM 07/14/20  Yes Biagio Borg, MD  pantoprazole (PROTONIX) 40 MG tablet Take 1 tablet (40 mg total) by mouth daily. 07/14/20  Yes Biagio Borg, MD  blood glucose meter kit and supplies Dispense based on patient and insurance preference. Use up to four times daily as directed. E11.9 04/15/18   Biagio Borg, MD  Blood Glucose Monitoring Suppl (ONE TOUCH ULTRA 2) w/Device KIT Use as directed daily 06/01/15   Biagio Borg, MD  furosemide (LASIX) 20 MG tablet Take 1 tablet (20 mg total) by mouth daily as needed. Patient taking differently: Take 20 mg by mouth daily as needed (swelling). 10/13/20   Biagio Borg, MD  Lancets [facility] DELICA PLUS [id number]) MISC USE AS DIRECTED TEST 1-2 TIMES A DAY 09/05/20   Biagio Borg, MD  [facility] ULTRA test strip USE AS INSTRUCTED TEST 1-2 TIMES A DAY 10/12/20   Biagio Borg, MD    Inpatient Medications: Scheduled Meds:  amiodarone  400 mg Oral Daily   atorvastatin  40 mg Oral Daily   carvedilol  6.25 mg Oral BID WC   insulin aspart  0-5 Units Subcutaneous QHS   insulin aspart  0-9 Units Subcutaneous TID WC   metoCLOPramide (REGLAN) injection  5 mg Intravenous Q6H   [START ON 10/22/2020] pantoprazole  40 mg Intravenous Q12H   peg 3350 powder  1 kit Oral Once   sodium chloride flush  3 mL Intravenous Q12H   Continuous Infusions:  pantoprazole 8 mg/hr (10/19/20 1253)   PRN Meds: acetaminophen **OR** acetaminophen, albuterol, ALPRAZolam, ondansetron **OR** ondansetron (ZOFRAN) IV  Allergies:    Allergies  Allergen Reactions   Ibuprofen Other (See Comments)    Bleeding events    Social History:   Social History   Socioeconomic History   Marital status: Widowed    Spouse name: Not on file   Number of children: 1   Years of education: Not on file   Highest education level: Not on file  Occupational History   Occupation: retired - Social research officer, government: RETIRED  Tobacco Use   Smoking status: Never    Smokeless tobacco: Never  Substance and Sexual Activity   Alcohol use: No    Alcohol/week: 0.0 standard drinks   Drug use: No   Sexual activity: Not on file  Other Topics Concern  Not on file  Social History Narrative   Recently widowed--husband died in 12/15/2018.      Now lives alone but her son usually stays with her at night.  During the day she has 2 nephews who live nearby to come in and check on her intermittently.  Also one of her nieces calls routinely.      When her son is home and awake, she may try to walk on a treadmill, but is scared to walk outside.   Social Determinants of Health   Financial Resource Strain: Not on file  Food Insecurity: Not on file  Transportation Needs: Not on file  Physical Activity: Not on file  Stress: Not on file  Social Connections: Not on file  Intimate Partner Violence: Not on file    Family History:    Family History  Problem Relation Age of Onset   Alcohol abuse Other    Arthritis Other        DJD   Hyperlipidemia Other    Heart disease Other    Stroke Other    Hypertension Other    Depression Other    Diabetes Other    Cancer Mother        ENT cancer   CAD Sister        Several MI & PPM; long term smoker, EtOH   Heart attack Sister 41       several   Arrhythmia Sister        s/p PPM (Dr. Sallyanne Kuster)     ROS:  Please see the history of present illness.   All other ROS reviewed and negative.     Physical Exam/Data:   Vitals:   10/19/20 1345 10/19/20 1400 10/19/20 1430 10/19/20 1500  BP: 120/79 109/70 123/84 135/71  Pulse: 89 62 65 92  Resp: (!) $RemoveB'23 19 19 19  'HboQgYKd$ Temp:      TempSrc:      SpO2: 100% 93% 96% 98%  Weight:      Height:        Intake/Output Summary (Last 24 hours) at 10/19/2020 1544 Last data filed at 10/19/2020 1234 Gross per 24 hour  Intake 390 ml  Output --  Net 390 ml   Last 3 Weights 10/18/2020 10/13/2020 10/06/2020  Weight (lbs) 199 lb 199 lb 201 lb  Weight (kg) 90.266 kg 90.266 kg 91.173 kg      Body mass index is 37.6 kg/m.  General:  Pale, older WF, no distress but seems mildly short of breath HEENT: normal Neck: no JVD Vascular: No carotid bruits; FA pulses 2+ bilaterally without bruits  Cardiac:  normal S1, S2; Irreg Irreg; 3/6 systolic murmur  Lungs:  clear to auscultation bilaterally, no wheezing, rhonchi or rales  Abd: soft, nontender, no hepatomegaly  Ext: mild bilateral LE edema Musculoskeletal:  No deformities, BUE and BLE strength normal and equal Skin: warm and dry  Neuro:  CNs 2-12 intact, no focal abnormalities noted Psych:  Normal affect   EKG:  The EKG was personally reviewed and demonstrates:  SB, 59 bpm Telemetry:  Telemetry was personally reviewed and demonstrates:  Afib RVR, rates 90-120s  Relevant CV Studies:  Cath: 09/29/20  Prox RCA lesion is 20% stenosed.   Mid RCA to Dist RCA lesion is 20% stenosed.   Ramus lesion is 30% stenosed.   Mid LAD lesion is 20% stenosed.   Dist LM to Prox LAD lesion is 20% stenosed.   Mild non-obstructive CAD Severe aortic stenosis (mean gradient  52.8 mmHg, peak to peak gradient 58 mmHg, AVA 0.90 cm2).    Recommendations: Will continue workup for TAVR  Echo: 08/10/20  IMPRESSIONS     1. The aortic valve is calcified. Aortic valve regurgitation is not  visualized. Severe aortic valve stenosis. Aortic valve area, by VTI  measures 0.86 cm. Aortic valve mean gradient measures 42.0 mmHg. Aortic  valve Vmax measures 4.36 m/s.   2. Left ventricular ejection fraction, by estimation, is 55 to 60%. The  left ventricle has normal function. The left ventricle has no regional  wall motion abnormalities. There is severe concentric left ventricular  hypertrophy. Left ventricular diastolic   parameters are indeterminate.   3. Right ventricular systolic function is normal. The right ventricular  size is normal. There is moderately elevated pulmonary artery systolic  pressure.   4. Left atrial size was mildly dilated.    5. The pericardial effusion is circumferential.   6. The mitral valve is abnormal. Trivial mitral valve regurgitation. The  mean mitral valve gradient is 2.0 mmHg with average heart rate of 53 bpm.  Severe mitral annular calcification.   Comparison(s): A prior study was performed on 10/01/2019. Prior images  reviewed side by side. Further increase in aortic valve gradients.   Laboratory Data:  High Sensitivity Troponin:   Recent Labs  Lab 09/27/20 0705 09/27/20 0935 10/19/20 0915 10/19/20 1326  TROPONINIHS 77* 74* 45* 45*     Chemistry Recent Labs  Lab 10/13/20 1418 10/18/20 1753  NA 139 139  K 4.0 4.0  CL 103 103  CO2 28 28  GLUCOSE 86 92  BUN 24* 27*  CREATININE 1.18 1.47*  CALCIUM 9.8 9.3  GFRNONAA  --  35*  ANIONGAP  --  8    No results for input(s): PROT, ALBUMIN, AST, ALT, ALKPHOS, BILITOT in the last 168 hours. Hematology Recent Labs  Lab 10/18/20 1753 10/19/20 1300  WBC 5.9  --   RBC 2.33*  --   HGB 7.6* 8.5*  HCT 23.4* 25.8*  MCV 100.4*  --   MCH 32.6  --   MCHC 32.5  --   RDW 14.2  --   PLT 204  --    BNP Recent Labs  Lab 10/18/20 1753  BNP 217.6*    DDimer No results for input(s): DDIMER in the last 168 hours.   Radiology/Studies:  DG Chest 2 View  Result Date: 10/18/2020 CLINICAL DATA:  Dyspnea. EXAM: CHEST - 2 VIEW COMPARISON:  Chest x-ray 10/06/2020. FINDINGS: There are some patchy opacities in the right middle lobe/right infrahilar region. The lungs otherwise appear clear. There is no pleural effusion or pneumothorax. The heart is mildly enlarged, unchanged. Mediastinal silhouette is within normal limits. No acute fractures are seen. There are degenerative changes of the thoracic spine. IMPRESSION: 1. Minimal right middle lobe/infrahilar atelectasis/airspace disease. 2. Recommend follow-up PA and lateral chest x-ray and 4-6 weeks to confirm resolution. Electronically Signed   By: Ronney Asters M.D.   On: 10/18/2020 19:54     Assessment  and Plan:   Mary Mccarthy is a 80 y.o. female with a hx of moderate-severe AS, non obstructive CAD, paroxysmal afib on Eliquis, HTN, CVA, HLD, DM and Chronic diastolic HF at who is being seen 10/19/2020 for the evaluation of preop evaluation at the request of Dr. Tamala Julian.  Pre op evaluation in the of anemia 2/2 GI bleed: Hemoglobin 7.6 on admission, drop of around 3 g over the past 3 weeks.  FOBT positive. GI consulted  with recommendations for Protonix drip and colonoscopy/EGD. With her severe AS, concerned about ability to undergo procedural sedation. She is quite asymptomatic with her AS while in Afib at present.  -- multidisciplinary approach will likely need to be taken given her complex issues. For now will focus on rate control, she has also received 1 unit PRBCs. Recent H/H  8.5/25.8. Will review with MD regarding involvement with structural team   Moderate to severe AS: Recent cardiac catheterization on 8/24 with mild nonobstructive CAD.  Currently undergoing TAVR work-up as an outpatient.  Appears she was scheduled to see Dr. Cyndia Bent tomorrow. -- as above, AS places her at higher risk for procedural complications. Will need to review plan with MD  Paroxysmal afib: currently in SR on initial EKG but noted in Afib RVR since placed on telemetry this morning around 8:30am when moved from waiting room -- Eliquis held on admission in the setting of acute bleed -- given her elevated rates, will hold oral amiodarone and start IV in attempts to slow rates/convert -- continue coreg 6.$RemoveBeforeDE'25mg'wBboemnlbsVhCbh$  BID  Chronic diastolic CHF: BNP mildly elevated, CXR without significant edema. Does not appear to be significantly volume overloaded on exam  HLD: on statin  DM: Hgb A1c 7.4 7/22 -- SSI while in patient  Hypomagnesemia: Mag 1.4 -- replete  For questions or updates, please contact [place] Please consult [email or web address] for contact info under    Signed, Reino Bellis, NP  10/19/2020 3:44  PM   Agree with note by Reino Bellis NP-C  We are asked to see Ms. Mclaine for acute on chronic diastolic heart failure, PAF, and GI bleeding being considered for TAVR.  She underwent right left heart cath by Dr. Angelena Form last month revealing nonobstructive disease.  She is admitted with heart failure and hypertensive crisis.  She did have a stroke the month before probably embolic related to her PAF was begun on Eliquis oral anticoagulation.  She is being seen today for increasing shortness of breath and GI bleeding.  She received 1 unit of packed red blood cells.  GI has evaluated her and is planning colonoscopy/EGD.  Her vital signs are stable.  She feels clinically improved after transfusion.  She does not appear wet on exam.  Her ventricular response is in the 90-1 20 range.  She is on p.o. amiodarone.  I am somewhat hesitant to allow her to undergo colonoscopy and EGD with severe aortic stenosis.  We will have the TAVR team evaluate her in the morning to determine the optimal timing of aortic valve replacement and GI evaluation.  We will hold her Eliquis, follow her hemoglobin and begin her on IV amiodarone to see if we can restore sinus rhythm.   Lorretta Harp, M.D., [place], [facility], [place] [address] [place], [place]  [id number]  [phone] 10/19/2020 5:20 PM

## 2020-10-19 NOTE — ED Notes (Signed)
Hospitalist at bedside assessing pt and updating pt on plan of care.

## 2020-10-20 ENCOUNTER — Ambulatory Visit: Payer: Medicare Other | Admitting: Internal Medicine

## 2020-10-20 ENCOUNTER — Other Ambulatory Visit: Payer: Self-pay | Admitting: Internal Medicine

## 2020-10-20 ENCOUNTER — Encounter: Payer: Medicare Other | Admitting: Surgery

## 2020-10-20 DIAGNOSIS — B372 Candidiasis of skin and nail: Secondary | ICD-10-CM | POA: Diagnosis present

## 2020-10-20 DIAGNOSIS — I480 Paroxysmal atrial fibrillation: Secondary | ICD-10-CM | POA: Diagnosis not present

## 2020-10-20 DIAGNOSIS — D649 Anemia, unspecified: Secondary | ICD-10-CM | POA: Diagnosis not present

## 2020-10-20 DIAGNOSIS — K921 Melena: Secondary | ICD-10-CM | POA: Diagnosis present

## 2020-10-20 DIAGNOSIS — K2970 Gastritis, unspecified, without bleeding: Secondary | ICD-10-CM | POA: Diagnosis not present

## 2020-10-20 DIAGNOSIS — I509 Heart failure, unspecified: Secondary | ICD-10-CM | POA: Diagnosis not present

## 2020-10-20 DIAGNOSIS — I110 Hypertensive heart disease with heart failure: Secondary | ICD-10-CM | POA: Diagnosis not present

## 2020-10-20 DIAGNOSIS — E1165 Type 2 diabetes mellitus with hyperglycemia: Secondary | ICD-10-CM | POA: Diagnosis not present

## 2020-10-20 DIAGNOSIS — K319 Disease of stomach and duodenum, unspecified: Secondary | ICD-10-CM | POA: Diagnosis not present

## 2020-10-20 DIAGNOSIS — I358 Other nonrheumatic aortic valve disorders: Secondary | ICD-10-CM | POA: Diagnosis present

## 2020-10-20 DIAGNOSIS — Z952 Presence of prosthetic heart valve: Secondary | ICD-10-CM | POA: Diagnosis not present

## 2020-10-20 DIAGNOSIS — I130 Hypertensive heart and chronic kidney disease with heart failure and stage 1 through stage 4 chronic kidney disease, or unspecified chronic kidney disease: Secondary | ICD-10-CM | POA: Diagnosis present

## 2020-10-20 DIAGNOSIS — I5033 Acute on chronic diastolic (congestive) heart failure: Secondary | ICD-10-CM | POA: Diagnosis not present

## 2020-10-20 DIAGNOSIS — I517 Cardiomegaly: Secondary | ICD-10-CM | POA: Diagnosis not present

## 2020-10-20 DIAGNOSIS — E559 Vitamin D deficiency, unspecified: Secondary | ICD-10-CM | POA: Diagnosis not present

## 2020-10-20 DIAGNOSIS — Z01818 Encounter for other preprocedural examination: Secondary | ICD-10-CM | POA: Diagnosis not present

## 2020-10-20 DIAGNOSIS — R001 Bradycardia, unspecified: Secondary | ICD-10-CM | POA: Diagnosis present

## 2020-10-20 DIAGNOSIS — K2901 Acute gastritis with bleeding: Secondary | ICD-10-CM | POA: Diagnosis not present

## 2020-10-20 DIAGNOSIS — Z6837 Body mass index (BMI) 37.0-37.9, adult: Secondary | ICD-10-CM | POA: Diagnosis not present

## 2020-10-20 DIAGNOSIS — J9811 Atelectasis: Secondary | ICD-10-CM | POA: Diagnosis present

## 2020-10-20 DIAGNOSIS — I700 Atherosclerosis of aorta: Secondary | ICD-10-CM | POA: Diagnosis present

## 2020-10-20 DIAGNOSIS — I2510 Atherosclerotic heart disease of native coronary artery without angina pectoris: Secondary | ICD-10-CM | POA: Diagnosis present

## 2020-10-20 DIAGNOSIS — I5032 Chronic diastolic (congestive) heart failure: Secondary | ICD-10-CM | POA: Diagnosis not present

## 2020-10-20 DIAGNOSIS — E785 Hyperlipidemia, unspecified: Secondary | ICD-10-CM | POA: Diagnosis present

## 2020-10-20 DIAGNOSIS — I272 Pulmonary hypertension, unspecified: Secondary | ICD-10-CM | POA: Diagnosis present

## 2020-10-20 DIAGNOSIS — E1122 Type 2 diabetes mellitus with diabetic chronic kidney disease: Secondary | ICD-10-CM | POA: Diagnosis present

## 2020-10-20 DIAGNOSIS — K5521 Angiodysplasia of colon with hemorrhage: Secondary | ICD-10-CM | POA: Diagnosis present

## 2020-10-20 DIAGNOSIS — I442 Atrioventricular block, complete: Secondary | ICD-10-CM | POA: Diagnosis not present

## 2020-10-20 DIAGNOSIS — Z20822 Contact with and (suspected) exposure to covid-19: Secondary | ICD-10-CM | POA: Diagnosis present

## 2020-10-20 DIAGNOSIS — I350 Nonrheumatic aortic (valve) stenosis: Secondary | ICD-10-CM | POA: Diagnosis not present

## 2020-10-20 DIAGNOSIS — K254 Chronic or unspecified gastric ulcer with hemorrhage: Secondary | ICD-10-CM | POA: Diagnosis not present

## 2020-10-20 DIAGNOSIS — R195 Other fecal abnormalities: Secondary | ICD-10-CM | POA: Diagnosis not present

## 2020-10-20 DIAGNOSIS — N1832 Chronic kidney disease, stage 3b: Secondary | ICD-10-CM | POA: Diagnosis present

## 2020-10-20 DIAGNOSIS — D62 Acute posthemorrhagic anemia: Secondary | ICD-10-CM | POA: Diagnosis present

## 2020-10-20 DIAGNOSIS — Z954 Presence of other heart-valve replacement: Secondary | ICD-10-CM | POA: Diagnosis not present

## 2020-10-20 DIAGNOSIS — I10 Essential (primary) hypertension: Secondary | ICD-10-CM | POA: Diagnosis not present

## 2020-10-20 DIAGNOSIS — N179 Acute kidney failure, unspecified: Secondary | ICD-10-CM | POA: Diagnosis present

## 2020-10-20 DIAGNOSIS — K922 Gastrointestinal hemorrhage, unspecified: Secondary | ICD-10-CM | POA: Diagnosis not present

## 2020-10-20 DIAGNOSIS — Z006 Encounter for examination for normal comparison and control in clinical research program: Secondary | ICD-10-CM | POA: Diagnosis not present

## 2020-10-20 LAB — CBC
HCT: 25.6 % — ABNORMAL LOW (ref 36.0–46.0)
Hemoglobin: 8.4 g/dL — ABNORMAL LOW (ref 12.0–15.0)
MCH: 31.2 pg (ref 26.0–34.0)
MCHC: 32.8 g/dL (ref 30.0–36.0)
MCV: 95.2 fL (ref 80.0–100.0)
Platelets: 178 10*3/uL (ref 150–400)
RBC: 2.69 MIL/uL — ABNORMAL LOW (ref 3.87–5.11)
RDW: 15.7 % — ABNORMAL HIGH (ref 11.5–15.5)
WBC: 6.1 10*3/uL (ref 4.0–10.5)
nRBC: 0 % (ref 0.0–0.2)

## 2020-10-20 LAB — BASIC METABOLIC PANEL
Anion gap: 6 (ref 5–15)
BUN: 20 mg/dL (ref 8–23)
CO2: 27 mmol/L (ref 22–32)
Calcium: 9 mg/dL (ref 8.9–10.3)
Chloride: 103 mmol/L (ref 98–111)
Creatinine, Ser: 0.93 mg/dL (ref 0.44–1.00)
GFR, Estimated: >60 mL/min (ref >60)
Glucose, Bld: 129 mg/dL — ABNORMAL HIGH (ref 70–99)
Potassium: 3.5 mmol/L (ref 3.5–5.1)
Sodium: 136 mmol/L (ref 135–145)

## 2020-10-20 LAB — GLUCOSE, CAPILLARY
Glucose-Capillary: 164 mg/dL — ABNORMAL HIGH (ref 70–99)
Glucose-Capillary: 148 mg/dL — ABNORMAL HIGH (ref 70–99)

## 2020-10-20 LAB — CBG MONITORING, ED
Glucose-Capillary: 112 mg/dL — ABNORMAL HIGH (ref 70–99)
Glucose-Capillary: 116 mg/dL — ABNORMAL HIGH (ref 70–99)

## 2020-10-20 NOTE — Progress Notes (Addendum)
Heart Failure Navigator Progress Note  Assessed for Heart & Vascular TOC clinic readiness.  Patient does not meet criteria due to severe AS on ECHO 08/2020, pending potential for TAVR.   Navigator available for reassessment of patient.   Ozella Rocks, MSN, RN Heart Failure Nurse Navigator [phone]

## 2020-10-20 NOTE — ED Notes (Addendum)
Patient requested to be moved to the chair, this RN assisted patient to chair. Call bell within reach.

## 2020-10-20 NOTE — Progress Notes (Addendum)
[facility] VALVE TEAM  Patient Name: Mary Mccarthy Date of Encounter: 10/20/2020  Primary Cardiologist: Dr. Beckie Salts Problem List     Principal Problem:   Symptomatic anemia Active Problems:   Type 2 diabetes mellitus with hyperglycemia, without long-term current use of insulin (HCC)   Hyperlipidemia with target LDL less than 70   Essential hypertension   Chronic diastolic heart failure (HCC)   GI bleed   Severe aortic stenosis   Coronary artery disease, non-occlusive   Anxiety   Paroxysmal atrial fibrillation (HCC)     Subjective   Feeling much better after transfusion and converting to sinus  Inpatient Medications    Scheduled Meds:  atorvastatin  40 mg Oral Daily   carvedilol  6.25 mg Oral BID WC   insulin aspart  0-5 Units Subcutaneous QHS   insulin aspart  0-9 Units Subcutaneous TID WC   [START ON 10/22/2020] pantoprazole  40 mg Intravenous Q12H   peg 3350 powder  1 kit Oral Once   sodium chloride flush  3 mL Intravenous Q12H   Continuous Infusions:  amiodarone 30 mg/hr (10/20/20 0913)   pantoprazole 8 mg/hr (10/19/20 1840)   PRN Meds: acetaminophen **OR** acetaminophen, albuterol, ALPRAZolam, ondansetron **OR** ondansetron (ZOFRAN) IV   Vital Signs    Vitals:   10/20/20 0000 10/20/20 0300 10/20/20 0400 10/20/20 0730  BP: (!) 176/61 (!) 157/51 (!) 142/58 (!) 140/56  Pulse: (!) 49 (!) 55 (!) 55 (!) 58  Resp: 18 (!) _0 Temp: 97.7 F (36.5 C)   98.2 F (36.8 C)  TempSrc: Oral   Oral  SpO2: 100% 100% 100% 98%  Weight:      Height:        Intake/Output Summary (Last 24 hours) at 10/20/2020 1049 Last data filed at 10/19/2020 1840 Gross per 24 hour  Intake 440 ml  Output --  Net 440 ml   Filed Weights   10/18/20 1738  Weight: 90.3 kg    Physical Exam    GEN: Well nourished, well developed, in no acute distress.  HEENT: Grossly normal.  Neck: Supple, no JVD, or masses. Cardiac: RRR.  Harsh 4/6 SEM. No, rubs, or gallops. No clubbing, cyanosis, edema.   Respiratory:  Respirations regular and unlabored, clear to auscultation bilaterally. GI: Soft, nontender, nondistended, BS + x 4. MS: no deformity or atrophy. Skin: warm and dry, no rash.  Neuro:  Strength and sensation are intact. Psych: AAOx3.  Normal affect.  Labs    CBC Recent Labs    10/18/20 1753 10/19/20 1300 10/19/20 2058 10/20/20 0450  WBC 5.9  --   --  6.1  NEUTROABS 3.6  --   --   --   HGB 7.6*   < > 8.8* 8.4*  HCT 23.4*   < > 26.8* 25.6*  MCV 100.4*  --   --  95.2  PLT 204  --   --  178   < > = values in this interval not displayed.   Basic Metabolic Panel Recent Labs    10/18/20 1753 10/19/20 1326 10/20/20 0450  NA 139  --  136  K 4.0  --  3.5  CL 103  --  103  CO2 28  --  27  GLUCOSE 92  --  129*  BUN 27*  --  20  CREATININE 1.47*  --  0.93  CALCIUM 9.3  --  9.0  MG  --  1.4*  --  Liver Function Tests No results for input(s): AST, ALT, ALKPHOS, BILITOT, PROT, ALBUMIN in the last 72 hours. No results for input(s): LIPASE, AMYLASE in the last 72 hours. Cardiac Enzymes No results for input(s): CKTOTAL, CKMB, CKMBINDEX, TROPONINI in the last 72 hours. BNP Invalid input(s): POCBNP D-Dimer No results for input(s): DDIMER in the last 72 hours. Hemoglobin A1C No results for input(s): HGBA1C in the last 72 hours. Fasting Lipid Panel No results for input(s): CHOL, HDL, LDLCALC, TRIG, CHOLHDL, LDLDIRECT in the last 72 hours. Thyroid Function Tests No results for input(s): TSH, T4TOTAL, T3FREE, THYROIDAB in the last 72 hours.  Invalid input(s): FREET3  Telemetry    Sinus and sinus brady - Personally Reviewed  ECG    Sinus HR 59  - Personally Reviewed  Radiology    DG Chest 2 View  Result Date: 10/18/2020 CLINICAL DATA:  Dyspnea. EXAM: CHEST - 2 VIEW COMPARISON:  Chest x-ray 10/06/2020. FINDINGS: There are some patchy opacities in the right middle lobe/right infrahilar  region. The lungs otherwise appear clear. There is no pleural effusion or pneumothorax. The heart is mildly enlarged, unchanged. Mediastinal silhouette is within normal limits. No acute fractures are seen. There are degenerative changes of the thoracic spine. IMPRESSION: 1. Minimal right middle lobe/infrahilar atelectasis/airspace disease. 2. Recommend follow-up PA and lateral chest x-ray and 4-6 weeks to confirm resolution. Electronically Signed   By: Ronney Asters M.D.   On: 10/18/2020 19:54    Cardiac Studies   Echo 08/10/20 IMPRESSIONS   1. The aortic valve is calcified. Aortic valve regurgitation is not  visualized. Severe aortic valve stenosis. Aortic valve area, by VTI  measures 0.86 cm. Aortic valve mean gradient measures 42.0 mmHg. Aortic  valve Vmax measures 4.36 m/s.   2. Left ventricular ejection fraction, by estimation, is 55 to 60%. The  left ventricle has normal function. The left ventricle has no regional  wall motion abnormalities. There is severe concentric left ventricular  hypertrophy. Left ventricular diastolic   parameters are indeterminate.   3. Right ventricular systolic function is normal. The right ventricular  size is normal. There is moderately elevated pulmonary artery systolic  pressure.   4. Left atrial size was mildly dilated.   5. The pericardial effusion is circumferential.   6. The mitral valve is abnormal. Trivial mitral valve regurgitation. The  mean mitral valve gradient is 2.0 mmHg with average heart rate of 53 bpm.  Severe mitral annular calcification.   Comparison(s): A prior study was performed on 10/01/2019. Prior images  reviewed side by side. Further increase in aortic valve gradients.   Conclusion(s)/Recommendation(s): If urgent to emergent non-cardiac surgery  is considered, care to manage hypotension should be considered in the  setting of severe aortic stenosis.   _____________________  10/20/20 RIGHT/LEFT HEART CATH AND CORONARY  ANGIOGRAPHY   Conclusion      Prox RCA lesion is 20% stenosed.   Mid RCA to Dist RCA lesion is 20% stenosed.   Ramus lesion is 30% stenosed.   Mid LAD lesion is 20% stenosed.   Dist LM to Prox LAD lesion is 20% stenosed.   Mild non-obstructive CAD Severe aortic stenosis (mean gradient 52.8 mmHg, peak to peak gradient 58 mmHg, AVA 0.90 cm2).    Recommendations: Will continue workup for TAVR  Patient Profile     Mary Mccarthy is a 80 y.o. female with a history of obesity, DMT2, non obstructive CAD, PUD/AVM, PAF on amio and Eliquis (initially diagnosed at the time of acute CVA  in 08/2020), HTN, pulmonary HTN, chronic diastolic CHF with recent admission 09/2020 for acute CHF and diagnosed with severe AS with plans for TAVR who presented to [facility] ED on 10/19/20 for evaluation of dyspnea and found to have acute anemia with GI bleeding. Cardiology was consulted for pre operative clearance prior to GI evaluation with colonoscopy/EGD and discussion about timing for TAVR surgery.  Assessment & Plan    Acute blood loss anemia 2/2 GI bleeding: Hg 7.6 on admission down from 10.6 in August. FOBT positive. Transfused 1U PRBCs and Hg 8.4 today. GI consulted with recommendations for Protonix drip and colonoscopy/EGD. Given history of previous UIG and melena will plan to clear her for EGD WITHOUT colonoscopy. If she continues to have issues with bleeding post TAVR, colonoscopy can be completed.   PAF: has intermittent afib with RVR. Continued on home amio with an IV bolus and now converted back to sinus. Continue on IV amio. Holding Eliquis with GI bleeding. If she continues to have issues with this, future consideration for Watchman.   Severe AS: pt with known severe AS. Missed apt with Dr. Cyndia Bent in office yesterday due to current admission. She has been extensively reviewed by our multidisciplinary valve team and will be seen by Dr. Cyndia Bent in the hospital tomorrow. We will tentatively plan to treat her with  Medtronic Evolut Pro + via the TF approach next Tuesday ( valve size 23 vs 26 mm will be decided at that time).   Chronic diastolic CHF: BNP mildly elevated, but CXR without significant edema. Appears euvolemic on exam. She takes Lasix 28m daily at home. Watch closely for evidence of CHF.   DMT2: continue SSI.   AKI: creat elevated yesterday to 1.47, likely related to hypovolemia due to acute blood loss. Now back to normal at 0.93.  Signed,Angelena Form PA-C  10/20/2020, 10:49 AM  Pager [phone]  Patient seen, examined. Available data reviewed. Agree with findings, assessment, and plan as outlined by KNell Range PA-C.  The patient is independently interviewed and examined.  She remains in the emergency room, waiting for a bed on the floor.  The patient is sitting up in a chair at the bedside.  She is alert, oriented, elderly woman in no distress.  JVP is normal, carotid upstrokes are brisk with bilateral bruits, lungs are clear bilaterally, heart is regular rate and rhythm with a 4/6 harsh late peaking crescendo decrescendo murmur at the right upper sternal border, abdomen is soft and nontender, extremities have no edema.  The patient's telemetry is reviewed and shows that she has converted from atrial fibrillation to sinus bradycardia.  The patient is known to me from her previous hospitalization.  Please see that full consultation note for details.  We had planned for outpatient cardiac surgical consultation this week and she was tentatively scheduled for TAVR next Tuesday.  She now presents with symptomatic atrial fibrillation with RVR and anemia with suspected gastrointestinal bleeding.  I agree with the plans as outlined above.  The patient is cleared to proceed with upper endoscopy tomorrow.  This should be low risk then undergoing full colonoscopy in the setting of her cardiac problems.  Considering her history, this seems like the most likely bleeding source.  We will follow  recommendations of GI to see if further testing is needed which her severe aortic stenosis is treated.  I would continue her on IV amiodarone.  We will withhold anticoagulation at present.  We will decide over the next 24 to  48 hours whether she can be discharged home and return for TAVR next week versus remaining inpatient.  We will plan to convert her from IV to oral amiodarone after her endoscopy is completed.  Otherwise continue current medications as above.  Dr. Cyndia Bent will see the patient tomorrow for full cardiac surgical consultation as part of a multidisciplinary heart valve team approach.  Sherren Mocha, M.D. 10/20/2020 12:59 PM

## 2020-10-20 NOTE — ED Notes (Signed)
Patient requested medication for  headache. 

## 2020-10-20 NOTE — Anesthesia Preprocedure Evaluation (Addendum)
Anesthesia Evaluation  Patient identified by MRN, date of birth, ID band Patient awake    Reviewed: Allergy & Precautions, NPO status , Patient's Chart, lab work & pertinent test results, reviewed documented beta blocker date and time   Airway Mallampati: II  TM Distance: >3 FB Neck ROM: Full    Dental no notable dental hx.    Pulmonary neg pulmonary ROS,    Pulmonary exam normal breath sounds clear to auscultation       Cardiovascular hypertension, Pt. on medications and Pt. on home beta blockers pulmonary hypertension (mod pHTN)+ CAD  negative cardio ROS Normal cardiovascular exam+ dysrhythmias Atrial Fibrillation + Valvular Problems/Murmurs (severe AS) AS  Rhythm:Regular Rate:Normal  1. The aortic valve is calcified. Aortic valve regurgitation is not  visualized. Severe aortic valve stenosis. Aortic valve area, by VTI  measures 0.86 cm. Aortic valve mean gradient measures 42.0 mmHg. Aortic  valve Vmax measures 4.36 m/s.  2. Left ventricular ejection fraction, by estimation, is 55 to 60%. The  left ventricle has normal function. The left ventricle has no regional  wall motion abnormalities. There is severe concentric left ventricular  hypertrophy. Left ventricular diastolic  parameters are indeterminate.  3. Right ventricular systolic function is normal. The right ventricular  size is normal. There is moderately elevated pulmonary artery systolic pressure.  4. Left atrial size was mildly dilated.  5. The pericardial effusion is circumferential.  6. The mitral valve is abnormal. Trivial mitral valve regurgitation. The mean mitral valve gradient is 2.0 mmHg with average heart rate of 53 bpm.  Severe mitral annular calcification.    Neuro/Psych PSYCHIATRIC DISORDERS Anxiety Depression CVA negative neurological ROS  negative psych ROS   GI/Hepatic negative GI ROS, Neg liver ROS, PUD, GERD  Controlled,ABLA 2/2 GIB, FOBT  positive    Endo/Other  negative endocrine ROSdiabetes, Well Controlled, Type 2, Oral Hypoglycemic AgentsBMI 38 Last a1c 7.4  Renal/GU negative Renal ROS  negative genitourinary   Musculoskeletal negative musculoskeletal ROS (+) Arthritis , Osteoarthritis,    Abdominal   Peds negative pediatric ROS (+)  Hematology negative hematology ROS (+) Blood dyscrasia, anemia , H/H 8.4/25.6, plt 178   Anesthesia Other Findings   Reproductive/Obstetrics negative OB ROS                          Anesthesia Physical Anesthesia Plan  ASA: 4  Anesthesia Plan: MAC   Post-op Pain Management:    Induction: Intravenous  PONV Risk Score and Plan: 2 and TIVA  Airway Management Planned: Natural Airway and Simple Face Mask  Additional Equipment: None  Intra-op Plan:   Post-operative Plan:   Informed Consent:   Plan Discussed with: Anesthesiologist  Anesthesia Plan Comments: Mary Mccarthy is a 80 y.o. female with medical history significant of PAF on Eliquis hypertension, hyperlipidemia, AS, CVA, DM type II, PUD, and GERD presents with complaints of shortness of breath acutely worsened over the last 2-3 days.  She had just recently been hospitalized from 8/22-8/26 for acute on chronic diastolic congestive heart failure exacerbation found to have severe aortic stenosis.  Patient was able to be diuresed and underwent cardiac cath which showed nonobstructive CAD.  She reports that she had a follow-up appointment with Dr. Caffie Pinto tomorrow in regards to need of TAVR.)       Anesthesia Quick Evaluation

## 2020-10-20 NOTE — Progress Notes (Addendum)
Daily Rounding Note  10/20/2020, 10:40 AM  LOS: 0 days   SUBJECTIVE:   Chief complaint: Symptomatic anemia  Patient feeling better after receiving PRBC yesterday.  Denies current shortness of breath, dizziness, chest pain, abdominal pain.  She is a bit hungry as she is only had clear liquids.  Received large dose of Dulcolax yesterday afternoon salting in large amount of dark, loose stool.  OBJECTIVE:         Vital signs in last 24 hours:    Temp:  [97.4 F (36.3 C)-98.2 F (36.8 C)] 98.2 F (36.8 C) (09/14 0730) Pulse Rate:  [49-113] 58 (09/14 0730) Resp:  [16-25] 16 (09/14 0730) BP: (101-176)/(48-93) 140/56 (09/14 0730) SpO2:  [93 %-100 %] 98 % (09/14 0730)   Filed Weights   10/18/20 1738  Weight: 90.3 kg   General: Patient looks moderately chronically ill but comfortable and in no acute distress. Heart: Irreg, Irreg with loud, harsh murmur Chest: Few fine rales in the bases but otherwise clear and no labored breathing or cough. Abdomen: Soft without tenderness.  No masses.  Active bowel sounds Extremities: No pedal edema. Neuro/Psych: Pleasant, calm, cooperative.  Fully alert.  Moves all 4 limbs without gross deficits.  Intake/Output from previous day: 09/13 0701 - 09/14 0700 In: 440 [I.V.:50; Blood:340; IV Piggyback:50] Out: -   Intake/Output this shift: No intake/output data recorded.  Lab Results: Recent Labs    10/18/20 1753 10/19/20 1300 10/19/20 2058 10/20/20 0450  WBC 5.9  --   --  6.1  HGB 7.6* 8.5* 8.8* 8.4*  HCT 23.4* 25.8* 26.8* 25.6*  PLT 204  --   --  178   BMET Recent Labs    10/18/20 1753 10/20/20 0450  NA 139 136  K 4.0 3.5  CL 103 103  CO2 28 27  GLUCOSE 92 129*  BUN 27* 20  CREATININE 1.47* 0.93  CALCIUM 9.3 9.0   LFT No results for input(s): PROT, ALBUMIN, AST, ALT, ALKPHOS, BILITOT, BILIDIR, IBILI in the last 72 hours. PT/INR Recent Labs    10/19/20 0840   LABPROT 16.2*  INR 1.3*   Hepatitis Panel No results for input(s): HEPBSAG, HCVAB, HEPAIGM, HEPBIGM in the last 72 hours.  Studies/Results: DG Chest 2 View  Result Date: 10/18/2020 CLINICAL DATA:  Dyspnea. EXAM: CHEST - 2 VIEW COMPARISON:  Chest x-ray 10/06/2020. FINDINGS: There are some patchy opacities in the right middle lobe/right infrahilar region. The lungs otherwise appear clear. There is no pleural effusion or pneumothorax. The heart is mildly enlarged, unchanged. Mediastinal silhouette is within normal limits. No acute fractures are seen. There are degenerative changes of the thoracic spine. IMPRESSION: 1. Minimal right middle lobe/infrahilar atelectasis/airspace disease. 2. Recommend follow-up PA and lateral chest x-ray and 4-6 weeks to confirm resolution. Electronically Signed   By: Darliss Cheney M.D.   On: 10/18/2020 19:54    ASSESMENT:   Symptomatic anemia.  FOBT positive dark/loose stool.  Hgb drop of close to 3 g over 3 weeks.  Hgb 7.6 >> 1 PRBC >> 8.4.  No iron/b12/folate deficiency based on iron/anemia labs of early July 2022.     Severe Ao stenosis.  TAVR work-up in progress.  Awaiting TAVR team opinion regarding proceeding with EGD, colonoscopy.  BNP 217.  PLAN   Plan for EGD tomorrow, appreciate recs from TAVR team. Team planning for EGD tomorrow   Jennye Moccasin  10/20/2020, 10:40 AM Phone [phone]  I have taken a history, reviewed the chart and examined the patient. I performed a substantive portion of this encounter, including complete performance of at least one of the key components, in conjunction with the APP. I agree with the APP's note, impression and recommendations.   TAVR team cleared patient for EGD tomorrow. Cards plans to do TAVR next week and if patient continues to have signs of bleeding after EGD, then could potentially get colonoscopy after TAVR if necessary.  Eulah Pont, MD

## 2020-10-20 NOTE — Progress Notes (Signed)
PROGRESS NOTE    Mary Mccarthy   NTZ:[id number]  DOB: 03-May-1940  DOA: 10/18/2020 PCP: Biagio Borg, MD   Brief Narrative:  Mary Mccarthy an 80 year old female with AF on Eliquis, severe aortic stenosis, hypertension, hyperlipidemia, CVA, diabetes mellitus type 2, PUD and GERD who presented to the hospital with complaints of shortness of breath that has progressed over the past 2 to 3 days. She admitted to having black stools on and off for the past 2 weeks and also taking ibuprofen intermittently for headaches. Hemoglobin 7.6, stool guaiac positive.  Subjective: Complains of a frontal headache.    Assessment & Plan:   Principal Problem: Acute blood loss anemia with episodes of melena over the past 2 weeks - Eliquis and Aspirin on hold - Hemoglobin when last checked on 10/06/2020 was 10.6 - She has been transfused 1 units of packed red blood cells and hemoglobin has improved from 7.6-8.4 - GI has evaluated the patient and will hold off on further procedures until evaluated by cardiology - Plan for EGD tomorrow- Continue PPI - Continue to follow hemoglobin  Active Problems: Severe aortic stenosis - Appreciate evaluation by the TAVR team- plan for procedure next Tuesday -He was due for an evaluation with Dr. Cyndia Bent on 9/13 but missed this as she was in the ED-Dr. Cyndia Bent will evaluate the patient tomorrow   Paroxysmal atrial fibrillation - Amiodarone at home-currently on IV with plans to convert to oral after endoscopy -Continue carvedilol - Holding Eliquis in setting of GI bleed  Chronic diastolic heart failure - Echo from 08/10/2020 notes there is severe concentric LVH and LV diastolic parameters are indeterminate, moderate elevation in PASP -Furosemide is on hold    Type 2 diabetes mellitus with hyperglycemia, without long-term current use of insulin  -Continue sliding scale insulin - Amaryl and metformin are on hold   Time spent in minutes: 35 DVT prophylaxis: SCDs  Start: 10/19/20 1041   Code Status: Full code Family Communication:  Level of Care: Level of care: Telemetry Medical Disposition Plan:  Status is: Inpatient  Remains inpatient appropriate because:Inpatient level of care appropriate due to severity of illness  Dispo: The patient is from: Home              Anticipated d/c is to: Home              Patient currently is not medically stable to d/c.   Difficult to place patient No      Consultants:  GI Cardiology Procedures:   Antimicrobials:  Anti-infectives (From admission, onward)    None        Objective: Vitals:   10/20/20 1516 10/20/20 1533 10/20/20 1533 10/20/20 1546  BP: (!) 158/62  (!) 202/55   Pulse: (!) 54  (!) 54   Resp: 18   18  Temp: 98.1 F (36.7 C)  97.7 F (36.5 C)   TempSrc: Oral  Oral   SpO2: 100%  100%   Weight:  89.4 kg    Height:  $Remove'5\' 1"'MpDNAoX$  (1.549 m)      Intake/Output Summary (Last 24 hours) at 10/20/2020 1703 Last data filed at 10/19/2020 1840 Gross per 24 hour  Intake 50 ml  Output --  Net 50 ml   Filed Weights   10/18/20 1738 10/20/20 1533  Weight: 90.3 kg 89.4 kg    Examination: General exam: Appears comfortable  HEENT: PERRLA, oral mucosa moist, no sclera icterus or thrush Respiratory system: Clear to auscultation. Respiratory effort normal. Cardiovascular system:  S1 & S2 heard, RRR. 3/6 systolic murmur Gastrointestinal system: Abdomen soft, non-tender, nondistended. Normal bowel sounds. Central nervous system: Alert and oriented. No focal neurological deficits. Extremities: No cyanosis, clubbing or edema Skin: No rashes or ulcers Psychiatry:  Mood & affect appropriate.     Data Reviewed: I have personally reviewed following labs and imaging studies  CBC: Recent Labs  Lab 10/18/20 1753 10/19/20 1300 10/19/20 2058 10/20/20 0450  WBC 5.9  --   --  6.1  NEUTROABS 3.6  --   --   --   HGB 7.6* 8.5* 8.8* 8.4*  HCT 23.4* 25.8* 26.8* 25.6*  MCV 100.4*  --   --  95.2  PLT  204  --   --  945   Basic Metabolic Panel: Recent Labs  Lab 10/18/20 1753 10/19/20 1326 10/20/20 0450  NA 139  --  136  K 4.0  --  3.5  CL 103  --  103  CO2 28  --  27  GLUCOSE 92  --  129*  BUN 27*  --  20  CREATININE 1.47*  --  0.93  CALCIUM 9.3  --  9.0  MG  --  1.4*  --    GFR: Estimated Creatinine Clearance: 47.4 mL/min (by C-G formula based on SCr of 0.93 mg/dL). Liver Function Tests: No results for input(s): AST, ALT, ALKPHOS, BILITOT, PROT, ALBUMIN in the last 168 hours. No results for input(s): LIPASE, AMYLASE in the last 168 hours. No results for input(s): AMMONIA in the last 168 hours. Coagulation Profile: Recent Labs  Lab 10/19/20 0840  INR 1.3*   Cardiac Enzymes: No results for input(s): CKTOTAL, CKMB, CKMBINDEX, TROPONINI in the last 168 hours. BNP (last 3 results) No results for input(s): PROBNP in the last 8760 hours. HbA1C: No results for input(s): HGBA1C in the last 72 hours. CBG: Recent Labs  Lab 10/19/20 1723 10/19/20 2210 10/20/20 0757 10/20/20 1127 10/20/20 1534  GLUCAP 134* 177* 112* 116* 164*   Lipid Profile: No results for input(s): CHOL, HDL, LDLCALC, TRIG, CHOLHDL, LDLDIRECT in the last 72 hours. Thyroid Function Tests: No results for input(s): TSH, T4TOTAL, FREET4, T3FREE, THYROIDAB in the last 72 hours. Anemia Panel: No results for input(s): VITAMINB12, FOLATE, FERRITIN, TIBC, IRON, RETICCTPCT in the last 72 hours. Urine analysis:    Component Value Date/Time   COLORURINE YELLOW 08/10/2020 [place] 08/10/2020 0433   LABSPEC 1.013 08/10/2020 0433   PHURINE 5.0 08/10/2020 0433   GLUCOSEU >=500 (A) 08/10/2020 0433   GLUCOSEU NEGATIVE 03/18/2020 1125   HGBUR NEGATIVE 08/10/2020 0433   BILIRUBINUR NEGATIVE 08/10/2020 0433   KETONESUR NEGATIVE 08/10/2020 0433   PROTEINUR NEGATIVE 08/10/2020 0433   UROBILINOGEN 1.0 03/18/2020 1125   NITRITE NEGATIVE 08/10/2020 0433   LEUKOCYTESUR NEGATIVE 08/10/2020 0433    Sepsis Labs: $RemoveBefo'@[email or web address]'LdglLTrHrUm$ (procalcitonin:4,lacticidven:4) ) Recent Results (from the past 240 hour(s))  Culture, blood (routine x 2)     Status: None (Preliminary result)   Collection Time: 10/19/20  8:37 AM   Specimen: BLOOD  Result Value Ref Range Status   Specimen Description BLOOD RIGHT ANTECUBITAL  Final   Special Requests   Final    BOTTLES DRAWN AEROBIC AND ANAEROBIC Blood Culture results may not be optimal due to an inadequate volume of blood received in culture bottles   Culture   Final    NO GROWTH < 24 HOURS Performed at [facility] [address], [place], [place] [address]    Report Status PENDING  Incomplete  Culture, blood (routine x 2)     Status: None (Preliminary result)   Collection Time: 10/19/20  8:40 AM   Specimen: BLOOD LEFT FOREARM  Result Value Ref Range Status   Specimen Description BLOOD LEFT FOREARM  Final   Special Requests   Final    BOTTLES DRAWN AEROBIC AND ANAEROBIC Blood Culture adequate volume   Culture   Final    NO GROWTH < 24 HOURS Performed at [facility], [place] [address] [place], [place] [address]    Report Status PENDING  Incomplete  Resp Panel by RT-PCR (Flu A&B, Covid) Nasopharyngeal Swab     Status: None   Collection Time: 10/19/20  9:29 AM   Specimen: Nasopharyngeal Swab; Nasopharyngeal(NP) swabs in vial transport medium  Result Value Ref Range Status   SARS Coronavirus 2 by RT PCR NEGATIVE NEGATIVE Final    Comment: (NOTE) SARS-CoV-2 target nucleic acids are NOT DETECTED.  The SARS-CoV-2 RNA is generally detectable in upper respiratory specimens during the acute phase of infection. The lowest concentration of SARS-CoV-2 viral copies this assay can detect is 138 copies/mL. A negative result does not preclude SARS-Cov-2 infection and should not be used as the sole basis for treatment or other patient management decisions. A negative result may occur with  improper specimen collection/handling, submission of specimen  other than nasopharyngeal swab, presence of viral mutation(s) within the areas targeted by this assay, and inadequate number of viral copies(<138 copies/mL). A negative result must be combined with clinical observations, patient history, and epidemiological information. The expected result is Negative.  Fact Sheet for Patients:  EntrepreneurPulse.com.au  Fact Sheet for Healthcare Providers:  IncredibleEmployment.be  This test is no t yet approved or cleared by the [place] FDA and  has been authorized for detection and/or diagnosis of SARS-CoV-2 by FDA under an Emergency Use Authorization (EUA). This EUA will remain  in effect (meaning this test can be used) for the duration of the COVID-19 declaration under Section 564(b)(1) of the Act, 21 U.S.C.section 360bbb-3(b)(1), unless the authorization is terminated  or revoked sooner.       Influenza A by PCR NEGATIVE NEGATIVE Final   Influenza B by PCR NEGATIVE NEGATIVE Final    Comment: (NOTE) The Xpert Xpress SARS-CoV-2/FLU/RSV plus assay is intended as an aid in the diagnosis of influenza from Nasopharyngeal swab specimens and should not be used as a sole basis for treatment. Nasal washings and aspirates are unacceptable for Xpert Xpress SARS-CoV-2/FLU/RSV testing.  Fact Sheet for Patients: EntrepreneurPulse.com.au  Fact Sheet for Healthcare Providers: IncredibleEmployment.be  This test is not yet approved or cleared by the [place] FDA and has been authorized for detection and/or diagnosis of SARS-CoV-2 by FDA under an Emergency Use Authorization (EUA). This EUA will remain in effect (meaning this test can be used) for the duration of the COVID-19 declaration under Section 564(b)(1) of the Act, 21 U.S.C. section 360bbb-3(b)(1), unless the authorization is terminated or revoked.  Performed at [facility], [place] [address], [place],  [place] [address]          Radiology Studies: DG Chest 2 View  Result Date: 10/18/2020 CLINICAL DATA:  Dyspnea. EXAM: CHEST - 2 VIEW COMPARISON:  Chest x-ray 10/06/2020. FINDINGS: There are some patchy opacities in the right middle lobe/right infrahilar region. The lungs otherwise appear clear. There is no pleural effusion or pneumothorax. The heart is mildly enlarged, unchanged. Mediastinal silhouette is within normal limits. No acute fractures are seen. There  are degenerative changes of the thoracic spine. IMPRESSION: 1. Minimal right middle lobe/infrahilar atelectasis/airspace disease. 2. Recommend follow-up PA and lateral chest x-ray and 4-6 weeks to confirm resolution. Electronically Signed   By: Ronney Asters M.D.   On: 10/18/2020 19:54      Scheduled Meds:  atorvastatin  40 mg Oral Daily   carvedilol  6.25 mg Oral BID WC   insulin aspart  0-5 Units Subcutaneous QHS   insulin aspart  0-9 Units Subcutaneous TID WC   [START ON 10/22/2020] pantoprazole  40 mg Intravenous Q12H   peg 3350 powder  1 kit Oral Once   sodium chloride flush  3 mL Intravenous Q12H   Continuous Infusions:  amiodarone 30 mg/hr (10/20/20 1120)   pantoprazole 8 mg/hr (10/20/20 1608)     LOS: 0 days      Debbe Odea, MD [facility] Pager: [email or web address] 10/20/2020, 5:03 PM

## 2020-10-20 NOTE — H&P (View-Only) (Signed)
Daily Rounding Note  10/20/2020, 10:40 AM  LOS: 0 days   SUBJECTIVE:   Chief complaint: Symptomatic anemia  Patient feeling better after receiving PRBC yesterday.  Denies current shortness of breath, dizziness, chest pain, abdominal pain.  She is a bit hungry as she is only had clear liquids.  Received large dose of Dulcolax yesterday afternoon salting in large amount of dark, loose stool.  OBJECTIVE:         Vital signs in last 24 hours:    Temp:  [97.4 F (36.3 C)-98.2 F (36.8 C)] 98.2 F (36.8 C) (09/14 0730) Pulse Rate:  [49-113] 58 (09/14 0730) Resp:  [16-25] 16 (09/14 0730) BP: (101-176)/(48-93) 140/56 (09/14 0730) SpO2:  [93 %-100 %] 98 % (09/14 0730)   Filed Weights   10/18/20 1738  Weight: 90.3 kg   General: Patient looks moderately chronically ill but comfortable and in no acute distress. Heart: Irreg, Irreg with loud, harsh murmur Chest: Few fine rales in the bases but otherwise clear and no labored breathing or cough. Abdomen: Soft without tenderness.  No masses.  Active bowel sounds Extremities: No pedal edema. Neuro/Psych: Pleasant, calm, cooperative.  Fully alert.  Moves all 4 limbs without gross deficits.  Intake/Output from previous day: 09/13 0701 - 09/14 0700 In: 440 [I.V.:50; Blood:340; IV Piggyback:50] Out: -   Intake/Output this shift: No intake/output data recorded.  Lab Results: Recent Labs    10/18/20 1753 10/19/20 1300 10/19/20 2058 10/20/20 0450  WBC 5.9  --   --  6.1  HGB 7.6* 8.5* 8.8* 8.4*  HCT 23.4* 25.8* 26.8* 25.6*  PLT 204  --   --  178   BMET Recent Labs    10/18/20 1753 10/20/20 0450  NA 139 136  K 4.0 3.5  CL 103 103  CO2 28 27  GLUCOSE 92 129*  BUN 27* 20  CREATININE 1.47* 0.93  CALCIUM 9.3 9.0   LFT No results for input(s): PROT, ALBUMIN, AST, ALT, ALKPHOS, BILITOT, BILIDIR, IBILI in the last 72 hours. PT/INR Recent Labs    10/19/20 0840   LABPROT 16.2*  INR 1.3*   Hepatitis Panel No results for input(s): HEPBSAG, HCVAB, HEPAIGM, HEPBIGM in the last 72 hours.  Studies/Results: DG Chest 2 View  Result Date: 10/18/2020 CLINICAL DATA:  Dyspnea. EXAM: CHEST - 2 VIEW COMPARISON:  Chest x-ray 10/06/2020. FINDINGS: There are some patchy opacities in the right middle lobe/right infrahilar region. The lungs otherwise appear clear. There is no pleural effusion or pneumothorax. The heart is mildly enlarged, unchanged. Mediastinal silhouette is within normal limits. No acute fractures are seen. There are degenerative changes of the thoracic spine. IMPRESSION: 1. Minimal right middle lobe/infrahilar atelectasis/airspace disease. 2. Recommend follow-up PA and lateral chest x-ray and 4-6 weeks to confirm resolution. Electronically Signed   By: Darliss Cheney M.D.   On: 10/18/2020 19:54    ASSESMENT:   Symptomatic anemia.  FOBT positive dark/loose stool.  Hgb drop of close to 3 g over 3 weeks.  Hgb 7.6 >> 1 PRBC >> 8.4.  No iron/b12/folate deficiency based on iron/anemia labs of early July 2022.     Severe Ao stenosis.  TAVR work-up in progress.  Awaiting TAVR team opinion regarding proceeding with EGD, colonoscopy.  BNP 217.  PLAN   Plan for EGD tomorrow, appreciate recs from TAVR team. Team planning for EGD tomorrow   Jennye Moccasin  10/20/2020, 10:40 AM Phone [phone]  I have taken a history, reviewed the chart and examined the patient. I performed a substantive portion of this encounter, including complete performance of at least one of the key components, in conjunction with the APP. I agree with the APP's note, impression and recommendations.   TAVR team cleared patient for EGD tomorrow. Cards plans to do TAVR next week and if patient continues to have signs of bleeding after EGD, then could potentially get colonoscopy after TAVR if necessary.  Eulah Pont, MD

## 2020-10-20 NOTE — ED Notes (Signed)
Patient visibly anxious at this time. Unable to get comfortable, patient states "I don't know what I need I just feel like I am closed up".  This RN opened door to patients room, lights dimmed. Patient moved back into bed.

## 2020-10-20 NOTE — Telephone Encounter (Signed)
Please to let pt know that [facility] dose not normally refill this long term  Please ask pt to request from cardiology

## 2020-10-20 NOTE — ED Notes (Signed)
Patient remains anxious, anxiety not improved with non pharm tactics. This RN offered patient PRN medication. Patient accepted

## 2020-10-21 ENCOUNTER — Encounter (HOSPITAL_COMMUNITY)
Admission: EM | Disposition: A | Discharge status: Home / Self Care | Payer: Self-pay | Source: Home / Self Care | Attending: Cardiovascular Disease

## 2020-10-21 ENCOUNTER — Inpatient Hospital Stay (HOSPITAL_COMMUNITY): Payer: Medicare Other | Admitting: Anesthesiology

## 2020-10-21 ENCOUNTER — Encounter (HOSPITAL_COMMUNITY): Payer: Self-pay | Admitting: Internal Medicine

## 2020-10-21 DIAGNOSIS — D649 Anemia, unspecified: Secondary | ICD-10-CM | POA: Diagnosis not present

## 2020-10-21 DIAGNOSIS — I5032 Chronic diastolic (congestive) heart failure: Secondary | ICD-10-CM | POA: Diagnosis not present

## 2020-10-21 DIAGNOSIS — Z20822 Contact with and (suspected) exposure to covid-19: Secondary | ICD-10-CM | POA: Diagnosis not present

## 2020-10-21 DIAGNOSIS — K922 Gastrointestinal hemorrhage, unspecified: Secondary | ICD-10-CM | POA: Diagnosis not present

## 2020-10-21 DIAGNOSIS — K921 Melena: Secondary | ICD-10-CM

## 2020-10-21 DIAGNOSIS — K254 Chronic or unspecified gastric ulcer with hemorrhage: Secondary | ICD-10-CM | POA: Diagnosis not present

## 2020-10-21 DIAGNOSIS — I10 Essential (primary) hypertension: Secondary | ICD-10-CM | POA: Diagnosis not present

## 2020-10-21 DIAGNOSIS — K2970 Gastritis, unspecified, without bleeding: Secondary | ICD-10-CM

## 2020-10-21 DIAGNOSIS — Z006 Encounter for examination for normal comparison and control in clinical research program: Secondary | ICD-10-CM | POA: Diagnosis not present

## 2020-10-21 DIAGNOSIS — D62 Acute posthemorrhagic anemia: Secondary | ICD-10-CM | POA: Diagnosis not present

## 2020-10-21 HISTORY — PX: BIOPSY: SHX5522

## 2020-10-21 HISTORY — PX: ESOPHAGOGASTRODUODENOSCOPY (EGD) WITH PROPOFOL: SHX5813

## 2020-10-21 LAB — BASIC METABOLIC PANEL
Anion gap: 7 (ref 5–15)
BUN: 11 mg/dL (ref 8–23)
CO2: 28 mmol/L (ref 22–32)
Calcium: 9.2 mg/dL (ref 8.9–10.3)
Chloride: 104 mmol/L (ref 98–111)
Creatinine, Ser: 0.98 mg/dL (ref 0.44–1.00)
GFR, Estimated: 58 mL/min — ABNORMAL LOW (ref >60)
Glucose, Bld: 140 mg/dL — ABNORMAL HIGH (ref 70–99)
Potassium: 4 mmol/L (ref 3.5–5.1)
Sodium: 139 mmol/L (ref 135–145)

## 2020-10-21 LAB — GLUCOSE, CAPILLARY
Glucose-Capillary: 120 mg/dL — ABNORMAL HIGH (ref 70–99)
Glucose-Capillary: 119 mg/dL — ABNORMAL HIGH (ref 70–99)
Glucose-Capillary: 172 mg/dL — ABNORMAL HIGH (ref 70–99)
Glucose-Capillary: 137 mg/dL — ABNORMAL HIGH (ref 70–99)

## 2020-10-21 LAB — CBC
HCT: 25.4 % — ABNORMAL LOW (ref 36.0–46.0)
Hemoglobin: 8.3 g/dL — ABNORMAL LOW (ref 12.0–15.0)
MCH: 31.3 pg (ref 26.0–34.0)
MCHC: 32.7 g/dL (ref 30.0–36.0)
MCV: 95.8 fL (ref 80.0–100.0)
Platelets: 188 10*3/uL (ref 150–400)
RBC: 2.65 MIL/uL — ABNORMAL LOW (ref 3.87–5.11)
RDW: 15.4 % (ref 11.5–15.5)
WBC: 6.8 10*3/uL (ref 4.0–10.5)
nRBC: 0 % (ref 0.0–0.2)

## 2020-10-21 SURGERY — ESOPHAGOGASTRODUODENOSCOPY (EGD) WITH PROPOFOL
Anesthesia: Monitor Anesthesia Care

## 2020-10-21 MED ORDER — AMIODARONE HCL 200 MG PO TABS
200.0000 mg | ORAL_TABLET | Freq: Two times a day (BID) | ORAL | Status: DC
  Administered 2020-10-21 – 2020-10-26 (×10): 200 mg via ORAL
  Filled 2020-10-21 (×11): qty 1

## 2020-10-21 MED ORDER — FLUCONAZOLE 100 MG PO TABS
200.0000 mg | ORAL_TABLET | Freq: Every day | ORAL | Status: AC
  Administered 2020-10-21 – 2020-10-28 (×7): 200 mg via ORAL
  Filled 2020-10-21 (×4): qty 1
  Filled 2020-10-21: qty 2
  Filled 2020-10-21 (×3): qty 1

## 2020-10-21 MED ORDER — SODIUM CHLORIDE 0.9 % IV SOLN: INTRAVENOUS | Status: DC | PRN

## 2020-10-21 MED ORDER — BUTAMBEN-TETRACAINE-BENZOCAINE 2-2-14 % EX AERO
INHALATION_SPRAY | CUTANEOUS | Status: DC | PRN
  Administered 2020-10-21: 2 via TOPICAL

## 2020-10-21 MED ORDER — PROPOFOL 500 MG/50ML IV EMUL
INTRAVENOUS | Status: DC | PRN
  Administered 2020-10-21: 75 ug/kg/min via INTRAVENOUS

## 2020-10-21 MED ORDER — PROPOFOL 10 MG/ML IV BOLUS
INTRAVENOUS | Status: DC | PRN
  Administered 2020-10-21: 20 mg via INTRAVENOUS

## 2020-10-21 MED ORDER — INSULIN ASPART 100 UNIT/ML IJ SOLN
0.0000 [IU] | Freq: Three times a day (TID) | INTRAMUSCULAR | Status: DC
  Administered 2020-10-21: 2 [IU] via SUBCUTANEOUS
  Administered 2020-10-22: 1 [IU] via SUBCUTANEOUS
  Administered 2020-10-22 – 2020-10-23 (×2): 2 [IU] via SUBCUTANEOUS
  Administered 2020-10-23 (×2): 1 [IU] via SUBCUTANEOUS
  Administered 2020-10-24: 2 [IU] via SUBCUTANEOUS
  Administered 2020-10-24 – 2020-10-25 (×2): 1 [IU] via SUBCUTANEOUS
  Administered 2020-10-25: 3 [IU] via SUBCUTANEOUS
  Administered 2020-10-26: 1 [IU] via SUBCUTANEOUS
  Administered 2020-10-26: 3 [IU] via SUBCUTANEOUS
  Administered 2020-10-27: 2 [IU] via SUBCUTANEOUS
  Administered 2020-10-27: 5 [IU] via SUBCUTANEOUS
  Administered 2020-10-28: 2 [IU] via SUBCUTANEOUS

## 2020-10-21 MED ORDER — PHENYLEPHRINE 40 MCG/ML (10ML) SYRINGE FOR IV PUSH (FOR BLOOD PRESSURE SUPPORT)
PREFILLED_SYRINGE | INTRAVENOUS | Status: DC | PRN
  Administered 2020-10-21: 80 ug via INTRAVENOUS

## 2020-10-21 MED ORDER — PANTOPRAZOLE SODIUM 40 MG PO TBEC
40.0000 mg | DELAYED_RELEASE_TABLET | Freq: Two times a day (BID) | ORAL | Status: DC
  Administered 2020-10-21 – 2020-10-28 (×12): 40 mg via ORAL
  Filled 2020-10-21 (×14): qty 1

## 2020-10-21 MED ORDER — DEXMEDETOMIDINE (PRECEDEX) IN NS 20 MCG/5ML (4 MCG/ML) IV SYRINGE
PREFILLED_SYRINGE | INTRAVENOUS | Status: DC | PRN
  Administered 2020-10-21: 8 ug via INTRAVENOUS

## 2020-10-21 NOTE — H&P (View-Only) (Signed)
[facility]   MULTIDISCIPLINARY HEART VALVE TEAM  Cardiology Consultation:   Patient ID: Mary Mccarthy MRN: [id number]; DOB: 09/10/1940  Admit date: 10/18/2020 Date of Consult: 10/21/2020  Primary Care Provider: Biagio Borg, MD [facility] Cardiologist: Glenetta Hew, MD  [facility] Electrophysiologist:  None    Patient Profile:   Mary Mccarthy is a 80 y.o. female with a hx of obesity, DMT2, non obstructive CAD, PUD/AVM, PAF on amio and Eliquis (initially diagnosed at the time of acute CVA in 08/2020), HTN, pulmonary HTN, chronic diastolic CHF with recent admission 09/2020 for acute CHF and diagnosed with severe AS with plans for TAVR who presented to [facility] ED on 10/19/20 for evaluation of dyspnea and found to have acute anemia with GI bleeding. She is being seen today for cardiothoracic surgery evaluation for consideration of TAVR, tentatively scheduled for 10/26/20  History of Present Illness:   She was admitted in 2017 with urosepsis, AKI and acute anemia with a hemoglobin of 5 requiring transfusion.  She underwent EGD and colonoscopy which showed several areas of potential GI bleeding but no current bleeding.  She was found to have AVMs and peptic ulcer disease.  She was admitted in July 2022 with a stroke and diagnosed with new atrial fibrillation at that time.  MRI of the brain showed small acute infarct diffusion bilaterally. She was started on Eliquis.  Echo at that time showed EF 55 to 60%, moderately elevated pulmonary hypertension, severe concentric LVH, severe mitral annular calcification and severe aortic stenosis with a mean gradient of 42 mmHg, peak gradient of 76 mmHg, AVA of 0.73 cm, DVI 0.27.   She was then readmitted in August 2022 with acute on chronic diastolic CHF in the setting of severe aortic stenosis.  She was diuresed with IV Lasix.  She also had atrial fibrillation with RVR that converted to sinus with IV amiodarone.  Cardiology was consulted  and she was seen by Dr. Burt Knack with the structural heart team.  She underwent left and right heart cath which showed nonobstructive CAD with severe aortic stenosis with a mean gradient of 53 mmHg.  She also underwent pre-TAVR CT studies. Cardiac gated CTA of the heart revealed anatomical characteristics consistent with aortic stenosis suitable for treatment by transcatheter aortic valve replacement without any significant complicating features and CTA of the aorta and iliac vessels demonstrated what appear to be adequate pelvic vascular access to facilitate a transfemoral approach.   She was scheduled to see Dr. Cyndia Bent in the office on 10/20/2020 as a part of the multidisciplinary valve team approach.  However, she was readmitted to the hospital on 9/13 for acute dyspnea and black tarry stools.  She was found to have a drop in her hemoglobin to 7.4 and transfused with 1 unit PRBCs. FOBT +. She was also found to be in afib with RVR but converted to sinus bradycardia on IV amio. Cardiology was consulted for preoperative clearance prior to GI work-up.  She was cleared for upper endoscopy which was completed today. Colonoscopy deferred to after TAVR, if still needed. EDG today showed gastritis with no signs of active bleeding but stigmata of recent bleeding.  It was suspected that the patient's anemia and melena was due to bleeding from gastric erosions in the setting of Eliquis use.  Continued use of a proton pump inhibitor was recommended and she was cleared to resume her Eliquis from a GI standpoint.  Cardiothoracic surgery is consulted for consideration of transcatheter aortic valve replacement,  which is tentatively scheduled for next Tuesday, 10/26/2020. She is feeling much better since blood transfusion and converting to sinus rhythm. She has continued dyspnea when taken off her nasal cannula 02. She also has orthopnea. No LE edema. No PND. No chest pain.  Past Medical History:  Diagnosis Date   ALLERGIC  RHINITIS 06/24/2007   Anemia    AVM (arteriovenous malformation) of colon    Blood transfusion without reported diagnosis 05/2015   COLONIC POLYPS, HX OF 06/24/2007   Coronary artery disease, non-occlusive 05/2015   Trop + w/ Acute Anemia =>CATH: small RI - Ostial 60%, ostial RCA 30% and dLAD 40-50%;; 9/'21: Cor Ca Score 624. Mild (25-49%) prox RCA & LAD,; Moderate (50-69%) Ostial Small RI & prox LCx.     DEPRESSION 10/08/2006   DIABETES MELLITUS, TYPE II 10/05/2006   GERD 06/24/2007   History of CVA (cerebrovascular accident)    08/2020- found to be in afib with RVR, started on Eliquis   HYPERLIPIDEMIA 10/08/2006   HYPERTENSION 10/08/2006   INSOMNIA 09/24/2008   Left knee DJD    Osteoporosis 03/10/2016   PAF (paroxysmal atrial fibrillation) (HCC)    PEPTIC ULCER DISEASE 10/08/2006   Severe aortic stenosis     Past Surgical History:  Procedure Laterality Date   APPENDECTOMY     BREAST BIOPSY     CARDIAC CATHETERIZATION N/A 05/21/2015   Procedure: Left Heart Cath and Coronary Angiography;  Surgeon: David W Harding, MD;  Location: [facility] INVASIVE CV LAB;  Service: Cardiovascular;: Ost RI 60%, Ost RCA 30%, dLAD tapers to small vessel w/ 40-50%. Mildly elevated LVEDP. Normal LV Fxn.   COLONOSCOPY N/A 05/20/2015   Procedure: COLONOSCOPY;  Surgeon: Steven Paul Armbruster, MD;  Location: [facility] ENDOSCOPY;  Service: Gastroenterology;  Laterality: N/A;   CORONARY CA2+ SCORE / CARDIAC CT ANGIOGRAM  10/09/2019   Calcium score 624.  82nd percentile. Dominant RCA: Mild (25-49%) proximal stenosis-distal bifurcation into PDA and PAV--< RPL branches.  LAD (1 major mid vessel diagonal) diffuse calcified plaque, mild proximal stenosis with minimal distal stenosis.  Small RI moderate ostial disease.  LCx-moderate mixed (50-69%) proximal stenosis.  Small dOM1 disease.  Trileaflet AoV, annular Ca2+ - probable AS   ESOPHAGOGASTRODUODENOSCOPY N/A 05/20/2015   Procedure: ESOPHAGOGASTRODUODENOSCOPY (EGD);  Surgeon:  Steven Paul Armbruster, MD;  Location: [facility] ENDOS[facility];  Service: Gastroenterology;  Laterality: N/A;   RIGHT/LEFT HEART CATH AND CORONARY ANGIOGRAPHY N/A 09/29/2020   Procedure: RIGHT/LEFT HEART CATH AND CORONARY ANGIOGRAPHY;  Surgeon: McAlhany, Christopher D, MD;  Location: [facility] IN[facility] LAB;  Service: Cardiovascular;  Laterality: N/A;   TONSILLECTOMY     TRANSTHORACIC ECHOCARDIOGRAM  03/2019; 09/2019   a) EF 60 to 65%.  Moderate LVH.  GRII DD.  Mod-Severe AS (m grad 36 mmHg, peak 59 mmHg); b) EF 65-70%, No RWMA. Gr1 DD/hi LAP, Mild hi PAP. Mod LA Dil. MOD AS (mean Grad 34.5 mmHg).  STABLE    TRANSTHORACIC ECHOCARDIOGRAM  04/12/2017   EF 60-65%. No RWMA.  GR 1 DD.  Moderate concentric LVH.  Mild LA dilation. Severe calcified aortic valve with moderate-severe aortic stenosis (peak/mean gradients 60/34 mmHg) and in severe range by AVA (0.8 cm2).  Mild to moderately increased PA pressures 41 mmHg with normal RV size and function..     TUBAL LIGATION       Home Medications:  Prior to Admission medications   Medication Sig Start Date End Date Taking? Authorizing Provider  acetaminophen (TYLENOL) 500 MG tablet Take 500 mg by mouth every   6 (six) hours as needed for moderate pain.   Yes [provider]  ALPRAZolam (XANAX) 0.25 MG tablet Take 1 tablet (0.25 mg total) by mouth 3 (three) times daily as needed for anxiety. 10/06/20  Yes Biagio Borg, MD  amiodarone (PACERONE) 200 MG tablet Take 2 tablets (400 mg total) by mouth daily. 10/12/20  Yes Biagio Borg, MD  apixaban (ELIQUIS) 5 MG TABS tablet Take 1 tablet (5 mg total) by mouth 2 (two) times daily. 08/19/20  Yes Biagio Borg, MD  aspirin 81 MG EC tablet Take 1 tablet (81 mg total) by mouth daily. Swallow whole. 10/01/20  Yes Charlynne Cousins, MD  atorvastatin (LIPITOR) 40 MG tablet TAKE 1 TABLET BY MOUTH EVERY DAY Patient taking differently: Take 40 mg by mouth daily. 08/20/20  Yes Leonie Man, MD  carvedilol (COREG) 6.25 MG tablet  TAKE 1 TABLET BY MOUTH TWICE A DAY Patient taking differently: Take 6.25 mg by mouth 2 (two) times daily with a meal. 02/26/20  Yes Leonie Man, MD  cyanocobalamin ([employer] VITAMIN B12) 1000 MCG tablet Take 1 tablet (1,000 mcg total) by mouth daily. 07/14/20  Yes Biagio Borg, MD  glimepiride (AMARYL) 2 MG tablet TAKE 1 TABLET BY MOUTH EVERY DAY BEFORE BREAKFAST Patient taking differently: Take 2 mg by mouth daily with breakfast. 09/16/20  Yes Biagio Borg, MD  metFORMIN (GLUCOPHAGE) 1000 MG tablet TAKE 1 TAB BY MOUTH IN THE AM,AND 1/2 TAB IN THE PM Patient taking differently: Take 500-1,000 mg by mouth 2 (two) times daily with a meal. Take 1053m in the AM and 500 in the PM 07/14/20  Yes JBiagio Borg MD  pantoprazole (PROTONIX) 40 MG tablet Take 1 tablet (40 mg total) by mouth daily. 07/14/20  Yes JBiagio Borg MD  blood glucose meter kit and supplies Dispense based on patient and insurance preference. Use up to four times daily as directed. E11.9 04/15/18   JBiagio Borg MD  Blood Glucose Monitoring Suppl (ONE TOUCH ULTRA 2) w/Device KIT Use as directed daily 06/01/15   JBiagio Borg MD  furosemide (LASIX) 20 MG tablet Take 1 tablet (20 mg total) by mouth daily as needed. Patient taking differently: Take 20 mg by mouth daily as needed (swelling). 10/13/20   JBiagio Borg MD  Lancets (Onecore HealthDELICA PLUS [id number] MISC USE AS DIRECTED TEST 1-2 TIMES A DAY 09/05/20   JBiagio Borg MD  [facility] LtdULTRA test strip USE AS INSTRUCTED TEST 1-2 TIMES A DAY 10/12/20   JBiagio Borg MD    Inpatient Medications: Scheduled Meds:  [MAR Hold] atorvastatin  40 mg Oral Daily   [MAR Hold] carvedilol  6.25 mg Oral BID WC   [MAR Hold] insulin aspart  0-5 Units Subcutaneous QHS   [MAR Hold] insulin aspart  0-9 Units Subcutaneous TID WC   [MAR Hold] pantoprazole  40 mg Intravenous Q12H   [MAR Hold] peg 3350 powder  1 kit Oral Once   [MAR Hold] sodium chloride flush  3 mL Intravenous Q12H   Continuous Infusions:   amiodarone 30 mg/hr (10/21/20 0816)   pantoprazole 8 mg/hr (10/21/20 0816)   PRN Meds: [[TMLHold] acetaminophen **OR** [MAR Hold] acetaminophen, [MAR Hold] albuterol, [MAR Hold] ALPRAZolam, [MAR Hold] ondansetron **OR** [MAR Hold] ondansetron (ZOFRAN) IV  Allergies:    Allergies  Allergen Reactions   Ibuprofen Other (See Comments)    Bleeding events    Social History:   Social History  Socioeconomic History   Marital status: Widowed    Spouse name: Not on file   Number of children: 1   Years of education: Not on file   Highest education level: Not on file  Occupational History   Occupation: retired - chemical plant worker    Employer: RETIRED  Tobacco Use   Smoking status: Never   Smokeless tobacco: Never  Vaping Use   Vaping Use: Never used  Substance and Sexual Activity   Alcohol use: No    Alcohol/week: 0.0 standard drinks   Drug use: No   Sexual activity: Not on file  Other Topics Concern   Not on file  Social History Narrative   Recently widowed--husband died in October 2020.      Now lives alone but her son usually stays with her at night.  During the day she has 2 nephews who live nearby to come in and check on her intermittently.  Also one of her nieces calls routinely.      When her son is home and awake, she may try to walk on a treadmill, but is scared to walk outside.   Social Determinants of Health   Financial Resource Strain: Not on file  Food Insecurity: Not on file  Transportation Needs: Not on file  Physical Activity: Not on file  Stress: Not on file  Social Connections: Not on file  Intimate Partner Violence: Not on file    Family History:    Family History  Problem Relation Age of Onset   Alcohol abuse Other    Arthritis Other        DJD   Hyperlipidemia Other    Heart disease Other    Stroke Other    Hypertension Other    Depression Other    Diabetes Other    Cancer Mother        ENT cancer   CAD Sister        Several MI &  PPM; long term smoker, EtOH   Heart attack Sister 55       several   Arrhythmia Sister        s/p PPM (Dr. Croitoru)     ROS:  Please see the history of present illness.  All other ROS reviewed and negative.     Physical Exam/Data:   Vitals:   10/21/20 0008 10/21/20 0218 10/21/20 0737 10/21/20 0846  BP: (!) 166/47  (!) 193/41 (!) 139/35  Pulse: (!) 54  60 (!) 52  Resp: 18  (!) 26 (!) 24  Temp: 98 F (36.7 C)  98.5 F (36.9 C)   TempSrc: Oral  Oral   SpO2: 100%  100% 100%  Weight:  89.6 kg 89.6 kg   Height:   5' 1" (1.549 m)     Intake/Output Summary (Last 24 hours) at 10/21/2020 0847 Last data filed at 10/21/2020 0832 Gross per 24 hour  Intake 547 ml  Output 400 ml  Net 147 ml   Last 3 Weights 10/21/2020 10/21/2020 10/20/2020  Weight (lbs) 197 lb 8.5 oz 197 lb 8 oz 197 lb 1.5 oz  Weight (kg) 89.6 kg 89.585 kg 89.4 kg     Body mass index is 37.32 kg/m.  General:  Well nourished, well developed, in no acute distress HEENT: normal Lymph: no adenopathy Neck: no JVD Endocrine:  No thryomegaly Vascular: No carotid bruits; FA pulses 2+ bilaterally without bruits  Cardiac:  normal S1, S2; RRR; 4/6 harsh late peaking crescendo-decrescendo murmur @ RUSB Lungs:    clear to auscultation bilaterally, no wheezing, rhonchi or rales  Abd: soft, nontender, no hepatomegaly  Ext: no edema Musculoskeletal:  No deformities, BUE and BLE strength normal and equal Skin: warm and dry. Erythematous patches in groins L>R under pannus.  Neuro:  CNs 2-12 intact, no focal abnormalities noted Psych:  Normal affect   EKG:  The EKG was personally reviewed and demonstrates:  Sinus and sinus brady  Telemetry:  Telemetry was personally reviewed and demonstrates:  sinus HR 59 bpm  Relevant CV Studies:  Echo 08/10/20 IMPRESSIONS   1. The aortic valve is calcified. Aortic valve regurgitation is not  visualized. Severe aortic valve stenosis. Aortic valve area, by VTI  measures 0.86 cm. Aortic valve  mean gradient measures 42.0 mmHg. Aortic  valve Vmax measures 4.36 m/s.   2. Left ventricular ejection fraction, by estimation, is 55 to 60%. The  left ventricle has normal function. The left ventricle has no regional  wall motion abnormalities. There is severe concentric left ventricular  hypertrophy. Left ventricular diastolic   parameters are indeterminate.   3. Right ventricular systolic function is normal. The right ventricular  size is normal. There is moderately elevated pulmonary artery systolic  pressure.   4. Left atrial size was mildly dilated.   5. The pericardial effusion is circumferential.   6. The mitral valve is abnormal. Trivial mitral valve regurgitation. The  mean mitral valve gradient is 2.0 mmHg with average heart rate of 53 bpm.  Severe mitral annular calcification.   Comparison(s): A prior study was performed on 10/01/2019. Prior images  reviewed side by side. Further increase in aortic valve gradients.   Conclusion(s)/Recommendation(s): If urgent to emergent non-cardiac surgery is considered, care to manage hypotension should be considered in the setting of severe aortic stenosis.     _____________________    10/20/20 RIGHT/LEFT HEART CATH AND CORONARY ANGIOGRAPHY    Conclusion       Prox RCA lesion is 20% stenosed.   Mid RCA to Dist RCA lesion is 20% stenosed.   Ramus lesion is 30% stenosed.   Mid LAD lesion is 20% stenosed.   Dist LM to Prox LAD lesion is 20% stenosed.   Mild non-obstructive CAD Severe aortic stenosis (mean gradient 52.8 mmHg, peak to peak gradient 58 mmHg, AVA 0.90 cm2).    Recommendations: Will continue workup for TAVR    _____________________   CT Cardiac 09/28/20 ADDENDUM REPORT: 09/29/2020 13:14   CLINICAL DATA:  Pre-op transcatheter aortic valve replacement (TAVR)   EXAM: Cardiac TAVR CT   TECHNIQUE: The patient was scanned on a Siemens Force 270 slice scanner. A 120 kV retrospective scan was triggered in the  descending thoracic aorta at 111 HU's. Gantry rotation speed was 270 msecs and collimation was .9 mm. The 3D data set was reconstructed in 5% intervals of the R-R cycle. Systolic and diastolic phases were analyzed on a dedicated work station using MPR, MIP and VRT modes. The patient received 71m OMNIPAQUE IOHEXOL 350 MG/ML SOLN of contrast.   FINDINGS: Aortic Valve: Tricuspid aortic valve. Severely reduced cusp separation. Severely thickened, moderately calcified aortic valve cusps.   AV calcium score: 1049   Virtual Basal Annulus Measurements:   Maximum/Minimum Diameter: 23.1 mm x 18.9 mm   Perimeter: 65.4 mm   Area: 328 mm2   No significant LVOT calcifications.   Based on these measurements, the annulus would be suitable for a 20 mm Edwards Sapien 3 valve vs 23 mm Medtronic Evolut Pro valve. Sinus dimensions are  borderline for 26 mm Medtronic Evolut Pro valve. Recommend Heart Team discussion for valve sizing.   Sinus of Valsalva Measurements:   Non-coronary:  26 mm   Right - coronary:  26 mm   Left - coronary:  27 mm   Sinus of Valsalva Height:   Left: 17.5 mm   Right: 18.6 mm   Aorta: Mild aortic atherosclerosis.   Sinotubular Junction: 27 mm   Ascending Thoracic Aorta:  31 mm   Aortic Arch:  24 mm   Descending Thoracic Aorta:  22 mm   Coronary Artery Height above Annulus:   Left Main: 13 mm   Right Coronary: 11.6 mm   Coronary Arteries: Normal origins. 3 vessel coronary artery calcifications.   Optimum Fluoroscopic Angle for Delivery: RAO 1, CRA 1   Moderate mitral annular calcification.   No left atrial appendage thrombus.   IMPRESSION: 1. Tricuspid aortic valve. Severely reduced cusp separation. Severely thickened, moderately calcified aortic valve cusps.   2.  AV calcium score: 1049   3. Annulus area: 328 mm2, no significant LVOT calcifications. Based on these measurements, the annulus would be suitable for a 20 mm Edwards Sapien 3  valve vs 23 mm Medtronic Evolut Pro valve. Sinus dimensions are borderline for 26 mm Medtronic Evolut Pro valve. Recommend Heart Team discussion for valve sizing.   4.  Sufficient coronary artery height from annulus.   5.  Optimum Fluoroscopic Angle for Delivery: RAO 1, CRA 1     ______________________   CTA chest/abdomen/pelvis 09/29/20 CLINICAL DATA:  79-year-old female with history of severe aortic stenosis. Preprocedural study prior to potential transcatheter aortic valve replacement (TAVR).   EXAM: CT ANGIOGRAPHY CHEST, ABDOMEN AND PELVIS   TECHNIQUE: Multidetector CT imaging through the chest, abdomen and pelvis was performed using the standard protocol during bolus administration of intravenous contrast. Multiplanar reconstructed images and MIPs were obtained and reviewed to evaluate the vascular anatomy.   CONTRAST:  95mL OMNIPAQUE IOHEXOL 350 MG/ML SOLN   COMPARISON:  Cardiac CT 10/09/2019.   FINDINGS: CTA CHEST FINDINGS   Cardiovascular: Heart size is normal. There is no significant pericardial fluid, thickening or pericardial calcification. There is aortic atherosclerosis, as well as atherosclerosis of the great vessels of the mediastinum and the coronary arteries, including calcified atherosclerotic plaque in the left main, left anterior descending and right coronary arteries. Severe thickening calcification of the aortic valve. Severe calcifications of the mitral annulus.   Mediastinum/Lymph Nodes: No pathologically enlarged mediastinal or hilar lymph nodes. Esophagus is unremarkable in appearance. No axillary lymphadenopathy.   Lungs/Pleura: Trace bilateral pleural effusions lying dependently (right greater than left). Associated areas of passive subsegmental atelectasis in the lower lobes of the lungs bilaterally. No acute consolidative airspace disease. 5 mm pulmonary nodule in the lateral segment of the right middle lobe (axial image 61 of series  7).   Musculoskeletal/Soft Tissues: There are no aggressive appearing lytic or blastic lesions noted in the visualized portions of the skeleton.   CTA ABDOMEN AND PELVIS FINDINGS   Hepatobiliary: No suspicious cystic or solid hepatic lesions. No intra or extrahepatic biliary ductal dilatation. Gallbladder is normal in appearance.   Pancreas: No pancreatic mass. No pancreatic ductal dilatation. No pancreatic or peripancreatic fluid collections or inflammatory changes.   Spleen: Unremarkable.   Adrenals/Urinary Tract: Bilateral kidneys and adrenal glands are normal in appearance. No hydroureteronephrosis. Urinary bladder is normal in appearance.   Stomach/Bowel: The appearance of the stomach is normal. No pathologic dilatation of small bowel or colon.   The appendix is not confidently identified and may be surgically absent. Regardless, there are no inflammatory changes noted adjacent to the cecum to suggest the presence of an acute appendicitis at this time.   Vascular/Lymphatic: Aortic atherosclerosis, without evidence of aneurysm or dissection in the abdominal or pelvic vasculature. Vascular findings and measurements pertinent to potential TAVR procedure, as detailed below. No lymphadenopathy noted in the abdomen or pelvis.   Reproductive: Uterus and ovaries are trophic.   Other: No significant volume of ascites.  No pneumoperitoneum.   Musculoskeletal: There are no aggressive appearing lytic or blastic lesions noted in the visualized portions of the skeleton.   VASCULAR MEASUREMENTS PERTINENT TO TAVR:   AORTA:   Minimal Aortic Diameter-14 x 15 mm   Severity of Aortic Calcification-mild   RIGHT PELVIS:   Right Common Iliac Artery -   Minimal Diameter-8.8 x 9.1 mm   Tortuosity-mild   Calcification-minimal   Right External Iliac Artery -   Minimal Diameter-6.9 x 6.5 mm   Tortuosity-moderate   Calcification-none   Right Common Femoral Artery -    Minimal Diameter-7.1 x 6.9 mm   Tortuosity-mild   Calcification-minimal   LEFT PELVIS:   Left Common Iliac Artery -   Minimal Diameter-9.6 x 8.8 mm   Tortuosity-mild   Calcification-none   Left External Iliac Artery -   Minimal Diameter-6.6 x 7.0 mm   Tortuosity-moderate   Calcification-none   Left Common Femoral Artery -   Minimal Diameter-6.7 x 6.8 mm   Tortuosity-mild   Calcification-minimal   Review of the MIP images confirms the above findings.   IMPRESSION: 1. Vascular findings and measurements pertinent to potential TAVR procedure, as detailed above. 2. Severe thickening calcification of the aortic valve, compatible with reported clinical history of severe aortic stenosis. 3. 5 mm pulmonary nodule in the lateral segment of the right middle lobe. No follow-up needed if patient is low-risk. Non-contrast chest CT can be considered in 12 months if patient is high-risk. This recommendation follows the consensus statement: Guidelines for Management of Incidental Pulmonary Nodules Detected on CT Images: From the Fleischner Society 2017; Radiology 2017; 284:228-243. 4. Trace bilateral pleural effusions (right greater than left) with areas of passive subsegmental atelectasis in the lower lobes of the lungs bilaterally. 5. Mild cardiomegaly. 6. Aortic atherosclerosis, in addition to left main and 3 vessel coronary artery disease. 7. Severe calcifications of the mitral annulus. 8. Additional incidental findings, as above.   ____________________  EGD 10/21/20 Impression:               - Normal esophagus.                           - Gastritis. Biopsied.                           - Normal examined duodenum. Recommendation:           - Return patient to hospital ward for ongoing care.                           - It is suspected that the patient's anemia and                            melena may have been due to bleeding from gastric                               erosions (coupled with use of Eliquis) since                            stigmata of recent bleeding were seen on today's                            exam.                           - Await pathology results.                           - Use a proton pump inhibitor PO BID for 8 weeks,                            then decrease to QD                           - Okay to restart Eliquis tomorrow from a GI                            standpoint                           - The findings and recommendations were discussed                            with the patient and/or primary team.   Laboratory Data:  High Sensitivity Troponin:   Recent Labs  Lab 09/27/20 0705 09/27/20 0935 10/19/20 0915 10/19/20 1326  TROPONINIHS 77* 74* 45* 45*     Chemistry Recent Labs  Lab 10/18/20 1753 10/20/20 0450  NA 139 136  K 4.0 3.5  CL 103 103  CO2 28 27  GLUCOSE 92 129*  BUN 27* 20  CREATININE 1.47* 0.93  CALCIUM 9.3 9.0  GFRNONAA 35* >60  ANIONGAP 8 6    No results for input(s): PROT, ALBUMIN, AST, ALT, ALKPHOS, BILITOT in the last 168 hours. Hematology Recent Labs  Lab 10/18/20 1753 10/19/20 1300 10/19/20 2058 10/20/20 0450 10/21/20 0420  WBC 5.9  --   --  6.1 6.8  RBC 2.33*  --   --  2.69* 2.65*  HGB 7.6*   < > 8.8* 8.4* 8.3*  HCT 23.4*   < > 26.8* 25.6* 25.4*  MCV 100.4*  --   --  95.2 95.8  MCH 32.6  --   --  31.2 31.3  MCHC 32.5  --   --  32.8 32.7  RDW 14.2  --   --  15.7* 15.4  PLT 204  --   --  178 188   < > = values in this interval not displayed.   BNP Recent Labs  Lab 10/18/20 1753  BNP 217.6*    DDimer No results for input(s): DDIMER in the last 168 hours.   Radiology/Studies:  DG Chest 2 View  Result Date: 10/18/2020 CLINICAL DATA:  Dyspnea. EXAM: CHEST - 2 VIEW COMPARISON:  Chest x-ray 10/06/2020. FINDINGS: There are some patchy opacities in the right middle lobe/right infrahilar region. The lungs otherwise appear clear. There is no pleural effusion or  pneumothorax. The heart is  mildly enlarged, unchanged. Mediastinal silhouette is within normal limits. No acute fractures are seen. There are degenerative changes of the thoracic spine. IMPRESSION: 1. Minimal right middle lobe/infrahilar atelectasis/airspace disease. 2. Recommend follow-up PA and lateral chest x-ray and 4-6 weeks to confirm resolution. Electronically Signed   By: Ronney Asters M.D.   On: 10/18/2020 19:54     STS Risk Calculator  Procedure: Isolated AVR  Risk of Mortality:  3.674%  Renal Failure:  4.478%  Permanent Stroke:  1.596%  Prolonged Ventilation:  12.064%  DSW Infection:  0.205%  Reoperation:  2.704%  Morbidity or Mortality:  16.887%  Short Length of Stay:  15.276%  Long Length of Stay:  12.440%   ___________________   [place]  [facility]-12 09/30/2020  1 a. Ability to shower/bathe Not at all limited  1 b. Ability to walk 1 block Extremely limited  1 c. Ability to hurry/jog Other, Did not do  2. Edema feet/ankles/legs Every morning  3. Limited by fatigue 1-2 times a week  4. Limited by dyspnea 3+ times a week, not every day  5. Sitting up / on 3+ pillows Never over the past 2 weeks  6. Limited enjoyment of life Limited quite a bit  7. Rest of life w/ symptoms Not at all satisfied  8 a. Participation in hobbies N/A, did not do for other reasons  8 b. Participation in chores Moderately limited  8 c. Visiting family/friends N/A, did not do for other reasons       Assessment and Plan:   Mary Mccarthy is a 80 y.o. female with symptoms of severe, stage D1 aortic stenosis with NYHA Class III symptoms. I have reviewed the patient's recent echocardiogram which is notable for preserved LV systolic function and severe aortic stenosis with peak gradient of 76 mm hg and mean transvalvular gradient of 42 mm hg. The patient's dimensionless index is 0.27 and calculated aortic valve area is 0.73 cm. L/RHC showed nonobstructive CAD  with severe aortic stenosis with a mean gradient of 53 mmHg. Cardiac gated CTA of the heart revealed anatomical characteristics consistent with aortic stenosis suitable for treatment by transcatheter aortic valve replacement without any significant complicating features and CTA of the aorta and iliac vessels demonstrated what appear to be adequate pelvic vascular access to facilitate a transfemoral approach. However, the patient has bilateral yeast infections L>R in her groin area under her pannus that may change our decision about access. Will start oral fluconazole and have Dr. Cyndia Bent examine.    I have reviewed the natural history of aortic stenosis with the patient. We have discussed the limitations of medical therapy and the poor prognosis associated with symptomatic aortic stenosis. We have reviewed potential treatment options, including palliative medical therapy, conventional surgical aortic valve replacement, and transcatheter aortic valve replacement. We discussed treatment options in the context of this patient's specific comorbid medical conditions.    The patient's predicted risk of mortality with conventional aortic valve replacement is 3.674% primarily based on age, obesity,anemia, DMT2, PAF, recent CVA, HTN, chronic diastolic CHF. Other significant comorbid conditions include recent GI bleeding. TAVR seems like a reasonable treatment option for this patient pending formal cardiac surgical consultation. We discussed typical evaluation which will require a gated cardiac CTA and a CTA of the chest/abdomen/pelvis to evaluate both his cardiac anatomy and peripheral vasculature. Her case has been scheduled for 9/20 @ 7:30am with Dr. Angelena Form and Dr. Cyndia Bent.   Dr. Cyndia Bent to follow on 9/16 AM. At that  time, we will make a decision about whether she needs to remain inpatient until her surgery next Tuesday or if she can potentially go home tomorrow and return Tuesday am for surgery.      Signed, Angelena Form, PA-C  10/21/2020 8:47 AM   Chart reviewed, patient examined, agree with above. This 80 year old woman has stage D, severe, symptomatic aortic stenosis with New York Heart Association class III symptoms of exertional fatigue and shortness of breath as well as orthopnea and peripheral edema consistent with chronic diastolic congestive heart failure.  She has a history of GI bleeding in 2017 and was noted to have AVMs at that time.  She was started on Eliquis in July 2022 after presenting with new onset atrial fibrillation and a stroke.  She has been undergoing work-up for possible TAVR for severe aortic stenosis but was readmitted on 913 with acute shortness of breath and black tarry stools.  Her hemoglobin dropped to 7.4.  She underwent upper endoscopy showing gastritis but no sign of active bleeding.  It was suspected that her anemia and melena were due to bleeding from gastric erosions related to her recent Eliquis use.  I have personally reviewed her 2D echocardiogram, cardiac catheterization, and CTA studies.  Her echocardiogram shows a severely calcified aortic valve with restricted leaflet mobility and a mean gradient of 42 mmHg consistent with severe aortic stenosis.  Aortic valve area of 0.86 cm.  Left ventricular ejection fraction is 55 to 60%.  Cardiac catheterization shows mild nonobstructive coronary disease with a mean gradient across aortic valve of 53 mmHg.  Gated cardiac CTA shows a relatively small annular area of 328 mm which would be suitable for a 20 mm Edwards SAPIEN 3 valve or a 23 mm Medtronic Evolut valve.  Her aortic sinus of Valsalva measurements are 26 to 27 mm which would be borderline for a 26 mm Medtronic Evolut valve.  Her abdominal and pelvic CTA shows adequate pelvic vascular anatomy to allow transfemoral insertion. Unfortunately she also has a candida rash in her groins. She is on diflucan. Will add topical mycostatin cream.   The patient was  counseled at length regarding treatment alternatives for management of severe symptomatic aortic stenosis. The risks and benefits of surgical intervention has been discussed in detail. Long-term prognosis with medical therapy was discussed. Alternative approaches such as conventional surgical aortic valve replacement, transcatheter aortic valve replacement, and palliative medical therapy were compared and contrasted at length. This discussion was placed in the context of the patient's own specific clinical presentation and past medical history. All of her questions have been addressed.   Following the decision to proceed with transcatheter aortic valve replacement, a discussion was held regarding what types of management strategies would be attempted intraoperatively in the event of life-threatening complications, including whether or not the patient would be considered a candidate for the use of cardiopulmonary bypass and/or conversion to open sternotomy for attempted surgical intervention. The patient is aware of the fact that transient use of cardiopulmonary bypass may be necessary. Given her age and comorbid risk factors I would only consider a sternotomy emergently under limited circumstances. The patient has been advised of a variety of complications that might develop including but not limited to risks of death, stroke, paravalvular leak, aortic dissection or other major vascular complications, aortic annulus rupture, device embolization, cardiac rupture or perforation, mitral regurgitation, acute myocardial infarction, arrhythmia, heart block or bradycardia requiring permanent pacemaker placement, congestive heart failure, respiratory failure, renal failure, pneumonia, infection,  other late complications related to structural valve deterioration or migration, or other complications that might ultimately cause a temporary or permanent loss of functional independence or other long term morbidity. The patient  provides full informed consent for the procedure as described and all questions were answered.     Bryan K. Bartle, MD  

## 2020-10-21 NOTE — Consult Note (Addendum)
[facility]   MULTIDISCIPLINARY HEART VALVE TEAM  Cardiology Consultation:   Patient ID: Mary Mccarthy MRN: [id number]; DOB: 05/26/1940  Admit date: 10/18/2020 Date of Consult: 10/21/2020  Primary Care Provider: Biagio Borg, MD [facility] Cardiologist: Glenetta Hew, MD  [facility] Electrophysiologist:  None    Patient Profile:   Mary Mccarthy is a 80 y.o. female with a hx of obesity, DMT2, non obstructive CAD, PUD/AVM, PAF on amio and Eliquis (initially diagnosed at the time of acute CVA in 08/2020), HTN, pulmonary HTN, chronic diastolic CHF with recent admission 09/2020 for acute CHF and diagnosed with severe AS with plans for TAVR who presented to [facility] ED on 10/19/20 for evaluation of dyspnea and found to have acute anemia with GI bleeding. She is being seen today for cardiothoracic surgery evaluation for consideration of TAVR, tentatively scheduled for 10/26/20  History of Present Illness:   She was admitted in 2017 with urosepsis, AKI and acute anemia with a hemoglobin of 5 requiring transfusion.  She underwent EGD and colonoscopy which showed several areas of potential GI bleeding but no current bleeding.  She was found to have AVMs and peptic ulcer disease.  She was admitted in July 2022 with a stroke and diagnosed with new atrial fibrillation at that time.  MRI of the brain showed small acute infarct diffusion bilaterally. She was started on Eliquis.  Echo at that time showed EF 55 to 60%, moderately elevated pulmonary hypertension, severe concentric LVH, severe mitral annular calcification and severe aortic stenosis with a mean gradient of 42 mmHg, peak gradient of 76 mmHg, AVA of 0.73 cm, DVI 0.27.   She was then readmitted in August 2022 with acute on chronic diastolic CHF in the setting of severe aortic stenosis.  She was diuresed with IV Lasix.  She also had atrial fibrillation with RVR that converted to sinus with IV amiodarone.  Cardiology was consulted  and she was seen by Dr. Burt Knack with the structural heart team.  She underwent left and right heart cath which showed nonobstructive CAD with severe aortic stenosis with a mean gradient of 53 mmHg.  She also underwent pre-TAVR CT studies. Cardiac gated CTA of the heart revealed anatomical characteristics consistent with aortic stenosis suitable for treatment by transcatheter aortic valve replacement without any significant complicating features and CTA of the aorta and iliac vessels demonstrated what appear to be adequate pelvic vascular access to facilitate a transfemoral approach.   She was scheduled to see Dr. Cyndia Bent in the office on 10/20/2020 as a part of the multidisciplinary valve team approach.  However, she was readmitted to the hospital on 9/13 for acute dyspnea and black tarry stools.  She was found to have a drop in her hemoglobin to 7.4 and transfused with 1 unit PRBCs. FOBT +. She was also found to be in afib with RVR but converted to sinus bradycardia on IV amio. Cardiology was consulted for preoperative clearance prior to GI work-up.  She was cleared for upper endoscopy which was completed today. Colonoscopy deferred to after TAVR, if still needed. EDG today showed gastritis with no signs of active bleeding but stigmata of recent bleeding.  It was suspected that the patient's anemia and melena was due to bleeding from gastric erosions in the setting of Eliquis use.  Continued use of a proton pump inhibitor was recommended and she was cleared to resume her Eliquis from a GI standpoint.  Cardiothoracic surgery is consulted for consideration of transcatheter aortic valve replacement,  which is tentatively scheduled for next Tuesday, 10/26/2020. She is feeling much better since blood transfusion and converting to sinus rhythm. She has continued dyspnea when taken off her nasal cannula 02. She also has orthopnea. No LE edema. No PND. No chest pain.  Past Medical History:  Diagnosis Date   ALLERGIC  RHINITIS 06/24/2007   Anemia    AVM (arteriovenous malformation) of colon    Blood transfusion without reported diagnosis 05/2015   COLONIC POLYPS, HX OF 06/24/2007   Coronary artery disease, non-occlusive 05/2015   Trop + w/ Acute Anemia =>CATH: small RI - Ostial 60%, ostial RCA 30% and dLAD 40-50%;; 9/'21: Cor Ca Score 624. Mild (25-49%) prox RCA & LAD,; Moderate (50-69%) Ostial Small RI & prox LCx.     DEPRESSION 10/08/2006   DIABETES MELLITUS, TYPE II 10/05/2006   GERD 06/24/2007   History of CVA (cerebrovascular accident)    08/2020- found to be in afib with RVR, started on Eliquis   HYPERLIPIDEMIA 10/08/2006   HYPERTENSION 10/08/2006   INSOMNIA 09/24/2008   Left knee DJD    Osteoporosis 03/10/2016   PAF (paroxysmal atrial fibrillation) (HCC)    PEPTIC ULCER DISEASE 10/08/2006   Severe aortic stenosis     Past Surgical History:  Procedure Laterality Date   APPENDECTOMY     BREAST BIOPSY     CARDIAC CATHETERIZATION N/A 05/21/2015   Procedure: Left Heart Cath and Coronary Angiography;  Surgeon: David W Harding, MD;  Location: [facility] INVASIVE CV LAB;  Service: Cardiovascular;: Ost RI 60%, Ost RCA 30%, dLAD tapers to small vessel w/ 40-50%. Mildly elevated LVEDP. Normal LV Fxn.   COLONOSCOPY N/A 05/20/2015   Procedure: COLONOSCOPY;  Surgeon: Steven Paul Armbruster, MD;  Location: [facility] ENDOSCOPY;  Service: Gastroenterology;  Laterality: N/A;   CORONARY CA2+ SCORE / CARDIAC CT ANGIOGRAM  10/09/2019   Calcium score 624.  82nd percentile. Dominant RCA: Mild (25-49%) proximal stenosis-distal bifurcation into PDA and PAV--< RPL branches.  LAD (1 major mid vessel diagonal) diffuse calcified plaque, mild proximal stenosis with minimal distal stenosis.  Small RI moderate ostial disease.  LCx-moderate mixed (50-69%) proximal stenosis.  Small dOM1 disease.  Trileaflet AoV, annular Ca2+ - probable AS   ESOPHAGOGASTRODUODENOSCOPY N/A 05/20/2015   Procedure: ESOPHAGOGASTRODUODENOSCOPY (EGD);  Surgeon:  Steven Paul Armbruster, MD;  Location: [facility] ENDOSCOPY;  Service: Gastroenterology;  Laterality: N/A;   RIGHT/LEFT HEART CATH AND CORONARY ANGIOGRAPHY N/A 09/29/2020   Procedure: RIGHT/LEFT HEART CATH AND CORONARY ANGIOGRAPHY;  Surgeon: McAlhany, Christopher D, MD;  Location: [facility] INVASIVE CV LAB;  Service: Cardiovascular;  Laterality: N/A;   TONSILLECTOMY     TRANSTHORACIC ECHOCARDIOGRAM  03/2019; 09/2019   a) EF 60 to 65%.  Moderate LVH.  GRII DD.  Mod-Severe AS (m grad 36 mmHg, peak 59 mmHg); b) EF 65-70%, No RWMA. Gr1 DD/hi LAP, Mild hi PAP. Mod LA Dil. MOD AS (mean Grad 34.5 mmHg).  STABLE    TRANSTHORACIC ECHOCARDIOGRAM  04/12/2017   EF 60-65%. No RWMA.  GR 1 DD.  Moderate concentric LVH.  Mild LA dilation. Severe calcified aortic valve with moderate-severe aortic stenosis (peak/mean gradients 60/34 mmHg) and in severe range by AVA (0.8 cm2).  Mild to moderately increased PA pressures 41 mmHg with normal RV size and function..     TUBAL LIGATION       Home Medications:  Prior to Admission medications   Medication Sig Start Date End Date Taking? Authorizing Provider  acetaminophen (TYLENOL) 500 MG tablet Take 500 mg by mouth every   6 (six) hours as needed for moderate pain.   Yes [provider]  ALPRAZolam (XANAX) 0.25 MG tablet Take 1 tablet (0.25 mg total) by mouth 3 (three) times daily as needed for anxiety. 10/06/20  Yes Biagio Borg, MD  amiodarone (PACERONE) 200 MG tablet Take 2 tablets (400 mg total) by mouth daily. 10/12/20  Yes Biagio Borg, MD  apixaban (ELIQUIS) 5 MG TABS tablet Take 1 tablet (5 mg total) by mouth 2 (two) times daily. 08/19/20  Yes Biagio Borg, MD  aspirin 81 MG EC tablet Take 1 tablet (81 mg total) by mouth daily. Swallow whole. 10/01/20  Yes Charlynne Cousins, MD  atorvastatin (LIPITOR) 40 MG tablet TAKE 1 TABLET BY MOUTH EVERY DAY Patient taking differently: Take 40 mg by mouth daily. 08/20/20  Yes Leonie Man, MD  carvedilol (COREG) 6.25 MG tablet  TAKE 1 TABLET BY MOUTH TWICE A DAY Patient taking differently: Take 6.25 mg by mouth 2 (two) times daily with a meal. 02/26/20  Yes Leonie Man, MD  cyanocobalamin ([employer] VITAMIN B12) 1000 MCG tablet Take 1 tablet (1,000 mcg total) by mouth daily. 07/14/20  Yes Biagio Borg, MD  glimepiride (AMARYL) 2 MG tablet TAKE 1 TABLET BY MOUTH EVERY DAY BEFORE BREAKFAST Patient taking differently: Take 2 mg by mouth daily with breakfast. 09/16/20  Yes Biagio Borg, MD  metFORMIN (GLUCOPHAGE) 1000 MG tablet TAKE 1 TAB BY MOUTH IN THE AM,AND 1/2 TAB IN THE PM Patient taking differently: Take 500-1,000 mg by mouth 2 (two) times daily with a meal. Take 1053m in the AM and 500 in the PM 07/14/20  Yes JBiagio Borg MD  pantoprazole (PROTONIX) 40 MG tablet Take 1 tablet (40 mg total) by mouth daily. 07/14/20  Yes JBiagio Borg MD  blood glucose meter kit and supplies Dispense based on patient and insurance preference. Use up to four times daily as directed. E11.9 04/15/18   JBiagio Borg MD  Blood Glucose Monitoring Suppl (ONE TOUCH ULTRA 2) w/Device KIT Use as directed daily 06/01/15   JBiagio Borg MD  furosemide (LASIX) 20 MG tablet Take 1 tablet (20 mg total) by mouth daily as needed. Patient taking differently: Take 20 mg by mouth daily as needed (swelling). 10/13/20   JBiagio Borg MD  Lancets (Onecore HealthDELICA PLUS [id number] MISC USE AS DIRECTED TEST 1-2 TIMES A DAY 09/05/20   JBiagio Borg MD  [facility] Ltd[facility] test strip USE AS INSTRUCTED TEST 1-2 TIMES A DAY 10/12/20   JBiagio Borg MD    Inpatient Medications: Scheduled Meds:  [MAR Hold] atorvastatin  40 mg Oral Daily   [MAR Hold] carvedilol  6.25 mg Oral BID WC   [MAR Hold] insulin aspart  0-5 Units Subcutaneous QHS   [MAR Hold] insulin aspart  0-9 Units Subcutaneous TID WC   [MAR Hold] pantoprazole  40 mg Intravenous Q12H   [MAR Hold] peg 3350 powder  1 kit Oral Once   [MAR Hold] sodium chloride flush  3 mL Intravenous Q12H   Continuous Infusions:   amiodarone 30 mg/hr (10/21/20 0816)   pantoprazole 8 mg/hr (10/21/20 0816)   PRN Meds: [[TMLHold] acetaminophen **OR** [MAR Hold] acetaminophen, [MAR Hold] albuterol, [MAR Hold] ALPRAZolam, [MAR Hold] ondansetron **OR** [MAR Hold] ondansetron (ZOFRAN) IV  Allergies:    Allergies  Allergen Reactions   Ibuprofen Other (See Comments)    Bleeding events    Social History:   Social History  Socioeconomic History   Marital status: Widowed    Spouse name: Not on file   Number of children: 1   Years of education: Not on file   Highest education level: Not on file  Occupational History   Occupation: retired - chemical plant worker    Employer: RETIRED  Tobacco Use   Smoking status: Never   Smokeless tobacco: Never  Vaping Use   Vaping Use: Never used  Substance and Sexual Activity   Alcohol use: No    Alcohol/week: 0.0 standard drinks   Drug use: No   Sexual activity: Not on file  Other Topics Concern   Not on file  Social History Narrative   Recently widowed--husband died in October 2020.      Now lives alone but her son usually stays with her at night.  During the day she has 2 nephews who live nearby to come in and check on her intermittently.  Also one of her nieces calls routinely.      When her son is home and awake, she may try to walk on a treadmill, but is scared to walk outside.   Social Determinants of Health   Financial Resource Strain: Not on file  Food Insecurity: Not on file  Transportation Needs: Not on file  Physical Activity: Not on file  Stress: Not on file  Social Connections: Not on file  Intimate Partner Violence: Not on file    Family History:    Family History  Problem Relation Age of Onset   Alcohol abuse Other    Arthritis Other        DJD   Hyperlipidemia Other    Heart disease Other    Stroke Other    Hypertension Other    Depression Other    Diabetes Other    Cancer Mother        ENT cancer   CAD Sister        Several MI &  PPM; long term smoker, EtOH   Heart attack Sister 55       several   Arrhythmia Sister        s/p PPM (Dr. Croitoru)     ROS:  Please see the history of present illness.  All other ROS reviewed and negative.     Physical Exam/Data:   Vitals:   10/21/20 0008 10/21/20 0218 10/21/20 0737 10/21/20 0846  BP: (!) 166/47  (!) 193/41 (!) 139/35  Pulse: (!) 54  60 (!) 52  Resp: 18  (!) 26 (!) 24  Temp: 98 F (36.7 C)  98.5 F (36.9 C)   TempSrc: Oral  Oral   SpO2: 100%  100% 100%  Weight:  89.6 kg 89.6 kg   Height:   5' 1" (1.549 m)     Intake/Output Summary (Last 24 hours) at 10/21/2020 0847 Last data filed at 10/21/2020 0832 Gross per 24 hour  Intake 547 ml  Output 400 ml  Net 147 ml   Last 3 Weights 10/21/2020 10/21/2020 10/20/2020  Weight (lbs) 197 lb 8.5 oz 197 lb 8 oz 197 lb 1.5 oz  Weight (kg) 89.6 kg 89.585 kg 89.4 kg     Body mass index is 37.32 kg/m.  General:  Well nourished, well developed, in no acute distress HEENT: normal Lymph: no adenopathy Neck: no JVD Endocrine:  No thryomegaly Vascular: No carotid bruits; FA pulses 2+ bilaterally without bruits  Cardiac:  normal S1, S2; RRR; 4/6 harsh late peaking crescendo-decrescendo murmur @ RUSB Lungs:    clear to auscultation bilaterally, no wheezing, rhonchi or rales  Abd: soft, nontender, no hepatomegaly  Ext: no edema Musculoskeletal:  No deformities, BUE and BLE strength normal and equal Skin: warm and dry. Erythematous patches in groins L>R under pannus.  Neuro:  CNs 2-12 intact, no focal abnormalities noted Psych:  Normal affect   EKG:  The EKG was personally reviewed and demonstrates:  Sinus and sinus brady  Telemetry:  Telemetry was personally reviewed and demonstrates:  sinus HR 59 bpm  Relevant CV Studies:  Echo 08/10/20 IMPRESSIONS   1. The aortic valve is calcified. Aortic valve regurgitation is not  visualized. Severe aortic valve stenosis. Aortic valve area, by VTI  measures 0.86 cm. Aortic valve  mean gradient measures 42.0 mmHg. Aortic  valve Vmax measures 4.36 m/s.   2. Left ventricular ejection fraction, by estimation, is 55 to 60%. The  left ventricle has normal function. The left ventricle has no regional  wall motion abnormalities. There is severe concentric left ventricular  hypertrophy. Left ventricular diastolic   parameters are indeterminate.   3. Right ventricular systolic function is normal. The right ventricular  size is normal. There is moderately elevated pulmonary artery systolic  pressure.   4. Left atrial size was mildly dilated.   5. The pericardial effusion is circumferential.   6. The mitral valve is abnormal. Trivial mitral valve regurgitation. The  mean mitral valve gradient is 2.0 mmHg with average heart rate of 53 bpm.  Severe mitral annular calcification.   Comparison(s): A prior study was performed on 10/01/2019. Prior images  reviewed side by side. Further increase in aortic valve gradients.   Conclusion(s)/Recommendation(s): If urgent to emergent non-cardiac surgery is considered, care to manage hypotension should be considered in the setting of severe aortic stenosis.     _____________________    10/20/20 RIGHT/LEFT HEART CATH AND CORONARY ANGIOGRAPHY    Conclusion       Prox RCA lesion is 20% stenosed.   Mid RCA to Dist RCA lesion is 20% stenosed.   Ramus lesion is 30% stenosed.   Mid LAD lesion is 20% stenosed.   Dist LM to Prox LAD lesion is 20% stenosed.   Mild non-obstructive CAD Severe aortic stenosis (mean gradient 52.8 mmHg, peak to peak gradient 58 mmHg, AVA 0.90 cm2).    Recommendations: Will continue workup for TAVR    _____________________   CT Cardiac 09/28/20 ADDENDUM REPORT: 09/29/2020 13:14   CLINICAL DATA:  Pre-op transcatheter aortic valve replacement (TAVR)   EXAM: Cardiac TAVR CT   TECHNIQUE: The patient was scanned on a Siemens Force 192 slice scanner. A 120 kV retrospective scan was triggered in the  descending thoracic aorta at 111 HU's. Gantry rotation speed was 270 msecs and collimation was .9 mm. The 3D data set was reconstructed in 5% intervals of the R-R cycle. Systolic and diastolic phases were analyzed on a dedicated work station using MPR, MIP and VRT modes. The patient received 95mL OMNIPAQUE IOHEXOL 350 MG/ML SOLN of contrast.   FINDINGS: Aortic Valve: Tricuspid aortic valve. Severely reduced cusp separation. Severely thickened, moderately calcified aortic valve cusps.   AV calcium score: 1049   Virtual Basal Annulus Measurements:   Maximum/Minimum Diameter: 23.1 mm x 18.9 mm   Perimeter: 65.4 mm   Area: 328 mm2   No significant LVOT calcifications.   Based on these measurements, the annulus would be suitable for a 20 mm Edwards Sapien 3 valve vs 23 mm Medtronic Evolut Pro valve. Sinus dimensions are   borderline for 26 mm Medtronic Evolut Pro valve. Recommend Heart Team discussion for valve sizing.   Sinus of Valsalva Measurements:   Non-coronary:  26 mm   Right - coronary:  26 mm   Left - coronary:  27 mm   Sinus of Valsalva Height:   Left: 17.5 mm   Right: 18.6 mm   Aorta: Mild aortic atherosclerosis.   Sinotubular Junction: 27 mm   Ascending Thoracic Aorta:  31 mm   Aortic Arch:  24 mm   Descending Thoracic Aorta:  22 mm   Coronary Artery Height above Annulus:   Left Main: 13 mm   Right Coronary: 11.6 mm   Coronary Arteries: Normal origins. 3 vessel coronary artery calcifications.   Optimum Fluoroscopic Angle for Delivery: RAO 1, CRA 1   Moderate mitral annular calcification.   No left atrial appendage thrombus.   IMPRESSION: 1. Tricuspid aortic valve. Severely reduced cusp separation. Severely thickened, moderately calcified aortic valve cusps.   2.  AV calcium score: 1049   3. Annulus area: 328 mm2, no significant LVOT calcifications. Based on these measurements, the annulus would be suitable for a 20 mm Edwards Sapien 3  valve vs 23 mm Medtronic Evolut Pro valve. Sinus dimensions are borderline for 26 mm Medtronic Evolut Pro valve. Recommend Heart Team discussion for valve sizing.   4.  Sufficient coronary artery height from annulus.   5.  Optimum Fluoroscopic Angle for Delivery: RAO 1, CRA 1     ______________________   CTA chest/abdomen/pelvis 09/29/20 CLINICAL DATA:  79-year-old female with history of severe aortic stenosis. Preprocedural study prior to potential transcatheter aortic valve replacement (TAVR).   EXAM: CT ANGIOGRAPHY CHEST, ABDOMEN AND PELVIS   TECHNIQUE: Multidetector CT imaging through the chest, abdomen and pelvis was performed using the standard protocol during bolus administration of intravenous contrast. Multiplanar reconstructed images and MIPs were obtained and reviewed to evaluate the vascular anatomy.   CONTRAST:  95mL OMNIPAQUE IOHEXOL 350 MG/ML SOLN   COMPARISON:  Cardiac CT 10/09/2019.   FINDINGS: CTA CHEST FINDINGS   Cardiovascular: Heart size is normal. There is no significant pericardial fluid, thickening or pericardial calcification. There is aortic atherosclerosis, as well as atherosclerosis of the great vessels of the mediastinum and the coronary arteries, including calcified atherosclerotic plaque in the left main, left anterior descending and right coronary arteries. Severe thickening calcification of the aortic valve. Severe calcifications of the mitral annulus.   Mediastinum/Lymph Nodes: No pathologically enlarged mediastinal or hilar lymph nodes. Esophagus is unremarkable in appearance. No axillary lymphadenopathy.   Lungs/Pleura: Trace bilateral pleural effusions lying dependently (right greater than left). Associated areas of passive subsegmental atelectasis in the lower lobes of the lungs bilaterally. No acute consolidative airspace disease. 5 mm pulmonary nodule in the lateral segment of the right middle lobe (axial image 61 of series  7).   Musculoskeletal/Soft Tissues: There are no aggressive appearing lytic or blastic lesions noted in the visualized portions of the skeleton.   CTA ABDOMEN AND PELVIS FINDINGS   Hepatobiliary: No suspicious cystic or solid hepatic lesions. No intra or extrahepatic biliary ductal dilatation. Gallbladder is normal in appearance.   Pancreas: No pancreatic mass. No pancreatic ductal dilatation. No pancreatic or peripancreatic fluid collections or inflammatory changes.   Spleen: Unremarkable.   Adrenals/Urinary Tract: Bilateral kidneys and adrenal glands are normal in appearance. No hydroureteronephrosis. Urinary bladder is normal in appearance.   Stomach/Bowel: The appearance of the stomach is normal. No pathologic dilatation of small bowel or colon.   The appendix is not confidently identified and may be surgically absent. Regardless, there are no inflammatory changes noted adjacent to the cecum to suggest the presence of an acute appendicitis at this time.   Vascular/Lymphatic: Aortic atherosclerosis, without evidence of aneurysm or dissection in the abdominal or pelvic vasculature. Vascular findings and measurements pertinent to potential TAVR procedure, as detailed below. No lymphadenopathy noted in the abdomen or pelvis.   Reproductive: Uterus and ovaries are trophic.   Other: No significant volume of ascites.  No pneumoperitoneum.   Musculoskeletal: There are no aggressive appearing lytic or blastic lesions noted in the visualized portions of the skeleton.   VASCULAR MEASUREMENTS PERTINENT TO TAVR:   AORTA:   Minimal Aortic Diameter-14 x 15 mm   Severity of Aortic Calcification-mild   RIGHT PELVIS:   Right Common Iliac Artery -   Minimal Diameter-8.8 x 9.1 mm   Tortuosity-mild   Calcification-minimal   Right External Iliac Artery -   Minimal Diameter-6.9 x 6.5 mm   Tortuosity-moderate   Calcification-none   Right Common Femoral Artery -    Minimal Diameter-7.1 x 6.9 mm   Tortuosity-mild   Calcification-minimal   LEFT PELVIS:   Left Common Iliac Artery -   Minimal Diameter-9.6 x 8.8 mm   Tortuosity-mild   Calcification-none   Left External Iliac Artery -   Minimal Diameter-6.6 x 7.0 mm   Tortuosity-moderate   Calcification-none   Left Common Femoral Artery -   Minimal Diameter-6.7 x 6.8 mm   Tortuosity-mild   Calcification-minimal   Review of the MIP images confirms the above findings.   IMPRESSION: 1. Vascular findings and measurements pertinent to potential TAVR procedure, as detailed above. 2. Severe thickening calcification of the aortic valve, compatible with reported clinical history of severe aortic stenosis. 3. 5 mm pulmonary nodule in the lateral segment of the right middle lobe. No follow-up needed if patient is low-risk. Non-contrast chest CT can be considered in 12 months if patient is high-risk. This recommendation follows the consensus statement: Guidelines for Management of Incidental Pulmonary Nodules Detected on CT Images: From the Fleischner Society 2017; Radiology 2017; 284:228-243. 4. Trace bilateral pleural effusions (right greater than left) with areas of passive subsegmental atelectasis in the lower lobes of the lungs bilaterally. 5. Mild cardiomegaly. 6. Aortic atherosclerosis, in addition to left main and 3 vessel coronary artery disease. 7. Severe calcifications of the mitral annulus. 8. Additional incidental findings, as above.   ____________________  EGD 10/21/20 Impression:               - Normal esophagus.                           - Gastritis. Biopsied.                           - Normal examined duodenum. Recommendation:           - Return patient to hospital ward for ongoing care.                           - It is suspected that the patient's anemia and                            melena may have been due to bleeding from gastric                               erosions (coupled with use of Eliquis) since                            stigmata of recent bleeding were seen on today's                            exam.                           - Await pathology results.                           - Use a proton pump inhibitor PO BID for 8 weeks,                            then decrease to QD                           - Okay to restart Eliquis tomorrow from a GI                            standpoint                           - The findings and recommendations were discussed                            with the patient and/or primary team.   Laboratory Data:  High Sensitivity Troponin:   Recent Labs  Lab 09/27/20 0705 09/27/20 0935 10/19/20 0915 10/19/20 1326  TROPONINIHS 77* 74* 45* 45*     Chemistry Recent Labs  Lab 10/18/20 1753 10/20/20 0450  NA 139 136  K 4.0 3.5  CL 103 103  CO2 28 27  GLUCOSE 92 129*  BUN 27* 20  CREATININE 1.47* 0.93  CALCIUM 9.3 9.0  GFRNONAA 35* >60  ANIONGAP 8 6    No results for input(s): PROT, ALBUMIN, AST, ALT, ALKPHOS, BILITOT in the last 168 hours. Hematology Recent Labs  Lab 10/18/20 1753 10/19/20 1300 10/19/20 2058 10/20/20 0450 10/21/20 0420  WBC 5.9  --   --  6.1 6.8  RBC 2.33*  --   --  2.69* 2.65*  HGB 7.6*   < > 8.8* 8.4* 8.3*  HCT 23.4*   < > 26.8* 25.6* 25.4*  MCV 100.4*  --   --  95.2 95.8  MCH 32.6  --   --  31.2 31.3  MCHC 32.5  --   --  32.8 32.7  RDW 14.2  --   --  15.7* 15.4  PLT 204  --   --  178 188   < > = values in this interval not displayed.   BNP Recent Labs  Lab 10/18/20 1753  BNP 217.6*    DDimer No results for input(s): DDIMER in the last 168 hours.   Radiology/Studies:  DG Chest 2 View  Result Date: 10/18/2020 CLINICAL DATA:  Dyspnea. EXAM: CHEST - 2 VIEW COMPARISON:  Chest x-ray 10/06/2020. FINDINGS: There are some patchy opacities in the right middle lobe/right infrahilar region. The lungs otherwise appear clear. There is no pleural effusion or  pneumothorax. The heart is  mildly enlarged, unchanged. Mediastinal silhouette is within normal limits. No acute fractures are seen. There are degenerative changes of the thoracic spine. IMPRESSION: 1. Minimal right middle lobe/infrahilar atelectasis/airspace disease. 2. Recommend follow-up PA and lateral chest x-ray and 4-6 weeks to confirm resolution. Electronically Signed   By: Ronney Asters M.D.   On: 10/18/2020 19:54     STS Risk Calculator  Procedure: Isolated AVR  Risk of Mortality:  3.674%  Renal Failure:  4.478%  Permanent Stroke:  1.596%  Prolonged Ventilation:  12.064%  DSW Infection:  0.205%  Reoperation:  2.704%  Morbidity or Mortality:  16.887%  Short Length of Stay:  15.276%  Long Length of Stay:  12.440%   ___________________   [place]  [facility]-12 09/30/2020  1 a. Ability to shower/bathe Not at all limited  1 b. Ability to walk 1 block Extremely limited  1 c. Ability to hurry/jog Other, Did not do  2. Edema feet/ankles/legs Every morning  3. Limited by fatigue 1-2 times a week  4. Limited by dyspnea 3+ times a week, not every day  5. Sitting up / on 3+ pillows Never over the past 2 weeks  6. Limited enjoyment of life Limited quite a bit  7. Rest of life w/ symptoms Not at all satisfied  8 a. Participation in hobbies N/A, did not do for other reasons  8 b. Participation in chores Moderately limited  8 c. Visiting family/friends N/A, did not do for other reasons       Assessment and Plan:   Mary Mccarthy is a 80 y.o. female with symptoms of severe, stage D1 aortic stenosis with NYHA Class III symptoms. I have reviewed the patient's recent echocardiogram which is notable for preserved LV systolic function and severe aortic stenosis with peak gradient of 76 mm hg and mean transvalvular gradient of 42 mm hg. The patient's dimensionless index is 0.27 and calculated aortic valve area is 0.73 cm. L/RHC showed nonobstructive CAD  with severe aortic stenosis with a mean gradient of 53 mmHg. Cardiac gated CTA of the heart revealed anatomical characteristics consistent with aortic stenosis suitable for treatment by transcatheter aortic valve replacement without any significant complicating features and CTA of the aorta and iliac vessels demonstrated what appear to be adequate pelvic vascular access to facilitate a transfemoral approach. However, the patient has bilateral yeast infections L>R in her groin area under her pannus that may change our decision about access. Will start oral fluconazole and have Dr. Cyndia Bent examine.    I have reviewed the natural history of aortic stenosis with the patient. We have discussed the limitations of medical therapy and the poor prognosis associated with symptomatic aortic stenosis. We have reviewed potential treatment options, including palliative medical therapy, conventional surgical aortic valve replacement, and transcatheter aortic valve replacement. We discussed treatment options in the context of this patient's specific comorbid medical conditions.    The patient's predicted risk of mortality with conventional aortic valve replacement is 3.674% primarily based on age, obesity,anemia, DMT2, PAF, recent CVA, HTN, chronic diastolic CHF. Other significant comorbid conditions include recent GI bleeding. TAVR seems like a reasonable treatment option for this patient pending formal cardiac surgical consultation. We discussed typical evaluation which will require a gated cardiac CTA and a CTA of the chest/abdomen/pelvis to evaluate both his cardiac anatomy and peripheral vasculature. Her case has been scheduled for 9/20 @ 7:30am with Dr. Angelena Form and Dr. Cyndia Bent.   Dr. Cyndia Bent to follow on 9/16 AM. At that  time, we will make a decision about whether she needs to remain inpatient until her surgery next Tuesday or if she can potentially go home tomorrow and return Tuesday am for surgery.      Signed, Kathryn Thompson, PA-C  10/21/2020 8:47 AM   Chart reviewed, patient examined, agree with above. This 80-year-old woman has stage D, severe, symptomatic aortic stenosis with New York Heart Association class III symptoms of exertional fatigue and shortness of breath as well as orthopnea and peripheral edema consistent with chronic diastolic congestive heart failure.  She has a history of GI bleeding in 2017 and was noted to have AVMs at that time.  She was started on Eliquis in July 2022 after presenting with new onset atrial fibrillation and a stroke.  She has been undergoing work-up for possible TAVR for severe aortic stenosis but was readmitted on 913 with acute shortness of breath and black tarry stools.  Her hemoglobin dropped to 7.4.  She underwent upper endoscopy showing gastritis but no sign of active bleeding.  It was suspected that her anemia and melena were due to bleeding from gastric erosions related to her recent Eliquis use.  I have personally reviewed her 2D echocardiogram, cardiac catheterization, and CTA studies.  Her echocardiogram shows a severely calcified aortic valve with restricted leaflet mobility and a mean gradient of 42 mmHg consistent with severe aortic stenosis.  Aortic valve area of 0.86 cm.  Left ventricular ejection fraction is 55 to 60%.  Cardiac catheterization shows mild nonobstructive coronary disease with a mean gradient across aortic valve of 53 mmHg.  Gated cardiac CTA shows a relatively small annular area of 328 mm which would be suitable for a 20 mm Edwards SAPIEN 3 valve or a 23 mm Medtronic Evolut valve.  Her aortic sinus of Valsalva measurements are 26 to 27 mm which would be borderline for a 26 mm Medtronic Evolut valve.  Her abdominal and pelvic CTA shows adequate pelvic vascular anatomy to allow transfemoral insertion. Unfortunately she also has a candida rash in her groins. She is on diflucan. Will add topical mycostatin cream.   The patient was  counseled at length regarding treatment alternatives for management of severe symptomatic aortic stenosis. The risks and benefits of surgical intervention has been discussed in detail. Long-term prognosis with medical therapy was discussed. Alternative approaches such as conventional surgical aortic valve replacement, transcatheter aortic valve replacement, and palliative medical therapy were compared and contrasted at length. This discussion was placed in the context of the patient's own specific clinical presentation and past medical history. All of her questions have been addressed.   Following the decision to proceed with transcatheter aortic valve replacement, a discussion was held regarding what types of management strategies would be attempted intraoperatively in the event of life-threatening complications, including whether or not the patient would be considered a candidate for the use of cardiopulmonary bypass and/or conversion to open sternotomy for attempted surgical intervention. The patient is aware of the fact that transient use of cardiopulmonary bypass may be necessary. Given her age and comorbid risk factors I would only consider a sternotomy emergently under limited circumstances. The patient has been advised of a variety of complications that might develop including but not limited to risks of death, stroke, paravalvular leak, aortic dissection or other major vascular complications, aortic annulus rupture, device embolization, cardiac rupture or perforation, mitral regurgitation, acute myocardial infarction, arrhythmia, heart block or bradycardia requiring permanent pacemaker placement, congestive heart failure, respiratory failure, renal failure, pneumonia, infection,   other late complications related to structural valve deterioration or migration, or other complications that might ultimately cause a temporary or permanent loss of functional independence or other long term morbidity. The patient  provides full informed consent for the procedure as described and all questions were answered.     Teresia Myint K. Meggen Spaziani, MD  

## 2020-10-21 NOTE — Progress Notes (Addendum)
Progress Note  Patient Name: Mary Mccarthy Date of Encounter: 10/21/2020  [facility] Cardiologist: Bryan Lemma, MD   Subjective   Feeling well. No chest pain, sob or palpitations.    Inpatient Medications    Scheduled Meds:  amiodarone  200 mg Oral BID   atorvastatin  40 mg Oral Daily   carvedilol  6.25 mg Oral BID WC   insulin aspart  0-5 Units Subcutaneous QHS   insulin aspart  0-9 Units Subcutaneous TID WC   [START ON 10/22/2020] pantoprazole  40 mg Intravenous Q12H   sodium chloride flush  3 mL Intravenous Q12H   Continuous Infusions:  pantoprazole 8 mg/hr (10/21/20 0816)   PRN Meds: acetaminophen **OR** acetaminophen, albuterol, ALPRAZolam, ondansetron **OR** ondansetron (ZOFRAN) IV   Vital Signs    Vitals:   10/21/20 0850 10/21/20 0900 10/21/20 0906 10/21/20 0934  BP: (!) 146/57 (!) 146/57 (!) 116/47 (!) 164/45  Pulse: (!) 58 (!) 54 (!) 54 (!) 50  Resp: 13 (!) 21 (!) 24 18  Temp:    (!) 97.5 F (36.4 C)  TempSrc:    Oral  SpO2: 100% 100% 100% 100%  Weight:      Height:        Intake/Output Summary (Last 24 hours) at 10/21/2020 1016 Last data filed at 10/21/2020 0832 Gross per 24 hour  Intake 547 ml  Output 400 ml  Net 147 ml   Last 3 Weights 10/21/2020 10/21/2020 10/20/2020  Weight (lbs) 197 lb 8.5 oz 197 lb 8 oz 197 lb 1.5 oz  Weight (kg) 89.6 kg 89.585 kg 89.4 kg      Telemetry    NSR - Personally Reviewed  ECG    N/A  Physical Exam   GEN: No acute distress.   Neck: No JVD Cardiac: RRR, no murmurs, rubs, or gallops.  Respiratory: Clear to auscultation bilaterally. GI: Soft, nontender, non-distended  MS: No edema; No deformity. Neuro:  Nonfocal  Psych: Normal affect   Labs    High Sensitivity Troponin:   Recent Labs  Lab 09/27/20 0705 09/27/20 0935 10/19/20 0915 10/19/20 1326  TROPONINIHS 77* 74* 45* 45*      Chemistry Recent Labs  Lab 10/18/20 1753 10/20/20 0450  NA 139 136  K 4.0 3.5  CL 103 103  CO2 28 27   GLUCOSE 92 129*  BUN 27* 20  CREATININE 1.47* 0.93  CALCIUM 9.3 9.0  GFRNONAA 35* >60  ANIONGAP 8 6     Hematology Recent Labs  Lab 10/18/20 1753 10/19/20 1300 10/19/20 2058 10/20/20 0450 10/21/20 0420  WBC 5.9  --   --  6.1 6.8  RBC 2.33*  --   --  2.69* 2.65*  HGB 7.6*   < > 8.8* 8.4* 8.3*  HCT 23.4*   < > 26.8* 25.6* 25.4*  MCV 100.4*  --   --  95.2 95.8  MCH 32.6  --   --  31.2 31.3  MCHC 32.5  --   --  32.8 32.7  RDW 14.2  --   --  15.7* 15.4  PLT 204  --   --  178 188   < > = values in this interval not displayed.    BNP Recent Labs  Lab 10/18/20 1753  BNP 217.6*     Radiology    No results found.  Cardiac Studies   None this admission   Patient Profile     80 y.o. female with a history of obesity, DMT2, non obstructive  CAD, PUD/AVM, PAF on amio and Eliquis (initially diagnosed at the time of acute CVA in 08/2020), HTN, pulmonary HTN, chronic diastolic CHF with recent admission 09/2020 for acute CHF and diagnosed with severe AS with plans for TAVR who presented to [facility] ED on 10/19/20 for evaluation of dyspnea and found to have acute anemia with GI bleeding. Cardiology was consulted for pre operative clearance prior to GI evaluation with colonoscopy/EGD and discussion about timing for TAVR surgery.  Assessment & Plan    PAF - Converted to sinus on IV amiodarone. Converted to PO amiodarone 200mg  BID x 7 days and then 200mg  daily.  - Eliquis on hold>>> resume per GI/Primary>> likely tomorrow per report  2. Severe AS - Pending TVAR. Seen by Dr. yesterday>> see consult note for recommendations. Dr. to see today.   3. Chronic diastolic CHF - BNP mildly elevated, CXR without significant edema. Does not appear to be significantly volume overloaded on exam. Watch fluid status closely.   4. Acute blood loss anemia with melena - ASA and Eliquis on hold - s/p transfused 1 unit - EGD with gastritis and felt bleeding from gastric erosion   For  questions or updates, please contact [facility] Please consult [email or web address] for contact info under        Signed, Excell Seltzer, PA  10/21/2020, 10:16 AM     Agree with note by Manson Passey PA-C  Patient tentatively scheduled for TAVR with Drs. Bartle  and Cooper next Tuesday.  Results of EGD noted with gastritis.  Biopsy obtained.  Back in sinus rhythm on IV amiodarone.  Will transition to p.o.  Eliquis on hold.  Patient is clinically improved.  Dr. Chelsea Aus to see today.  Unclear whether GI plans to perform colonoscopy as well.  Potential discharge tomorrow.  Monday, M.D., FACP, [facility], [facility] [address], [address]  [place]  [phone] 10/21/2020 10:27 AM

## 2020-10-21 NOTE — Consult Note (Signed)
   [facility] CM Inpatient Consult   10/21/2020  Mary Mccarthy [birth year] [id number]  [facility] [[facility]]  Accountable Care Organization [ACO] Patient: [name] Medicare  Primary Care Provider:  Corwin Levins, MD,  Primary Care - [facility] is an Embedded provider with a chronic care management [CCM] team and program. This provider is listed for the Transition of Care [TOC] follow up  Patient was screened for less than 30 days readmission.  Patient will have the transition of care call conducted by the primary care provider. This patient is also in an Embedded practice which has an Embedded Chronic Care Management team.  Plan: Will follow for progress and disposition needs with the inpatient [facility] team for post hospital recommendations and needs. Can refer to the Embedded team for follow up.  Please contact for further questions,  Charlesetta Shanks, RN BSN CCM [facility]  [phone] business mobile phone Toll free office [phone]  Fax number: [phone] [email or web address]

## 2020-10-21 NOTE — Interval H&P Note (Signed)
History and Physical Interval Note:  10/21/2020 8:17 AM  Mary Mccarthy  has presented today for surgery, with the diagnosis of Anemia.  FOBT positive.  On Eliquis..  The various methods of treatment have been discussed with the patient and family. After consideration of risks, benefits and other options for treatment, the patient has consented to  Procedure(s): ESOPHAGOGASTRODUODENOSCOPY (EGD) WITH PROPOFOL (N/A) as a surgical intervention.  The patient's history has been reviewed, patient examined, no change in status, stable for surgery.  I have reviewed the patient's chart and labs.  Questions were answered to the patient's satisfaction.     Imogene Burn

## 2020-10-21 NOTE — Op Note (Signed)
[facility] Patient Name: Mary Mccarthy Procedure Date : 10/21/2020 MRN: [id number] Attending MD: Georgian Co ,  Date of Birth: 1940/02/22 CSN: [id number] Age: 80 Admit Type: Outpatient Procedure:                Upper GI endoscopy Indications:              Iron deficiency anemia, Melena Providers:                Adline Mango" Stasia Cavalier, RN, Cherylynn Ridges, Technician, Claybon Jabs CRNA, CRNA Referring MD:             Debbe Odea, MD Medicines:                Monitored Anesthesia Care Complications:            No immediate complications. Estimated Blood Loss:     Estimated blood loss was minimal. Procedure:                Pre-Anesthesia Assessment:                           - Prior to the procedure, a History and Physical                            was performed, and patient medications and                            allergies were reviewed. The patient is competent.                            The risks and benefits of the procedure and the                            sedation options and risks were discussed with the                            patient. All questions were answered and informed                            consent was obtained. Patient identification and                            proposed procedure were verified by the physician                            in the pre-procedure area. Mental Status                            Examination: normal. Prophylactic Antibiotics: The                            patient does not require prophylactic antibiotics.  Prior Anticoagulants: The patient has taken Eliquis                            (apixaban), last dose was 3 days prior to                            procedure. ASA Grade Assessment: III - A patient                            with severe systemic disease. After reviewing the                            risks and benefits, the patient was  deemed in                            satisfactory condition to undergo the procedure.                            The anesthesia plan was to use monitored anesthesia                            care (MAC). Immediately prior to administration of                            medications, the patient was re-assessed for                            adequacy to receive sedatives. The heart rate,                            respiratory rate, oxygen saturations, blood                            pressure, adequacy of pulmonary ventilation, and                            response to care were monitored throughout the                            procedure. The physical status of the patient was                            re-assessed after the procedure.                           After obtaining informed consent, the endoscope was                            passed under direct vision. Throughout the                            procedure, the patient's blood pressure, pulse, and  oxygen saturations were monitored continuously. The                            [id number]-H190 ([id number]) Olympus endoscope was introduced                            through the mouth, and advanced to the third part                            of duodenum. The upper GI endoscopy was                            accomplished without difficulty. The patient                            tolerated the procedure well. Scope In: Scope Out: Findings:      The examined esophagus was normal.      Localized inflammation characterized by erosions (some erosions with       some overlying blood clot) and erythema was found in the gastric body       and in the gastric antrum. Biopsies were taken with a cold forceps for       histology.      The examined duodenum was normal. Impression:               - Normal esophagus.                           - Gastritis. Biopsied.                           - Normal examined  duodenum. Recommendation:           - Return patient to hospital ward for ongoing care.                           - It is suspected that the patient's anemia and                            melena may have been due to bleeding from gastric                            erosions (coupled with use of Eliquis) since                            stigmata of recent bleeding were seen on today's                            exam.                           - Await pathology results.                           - Use a proton pump inhibitor PO BID for 8 weeks,  then decrease to QD                           - Okay to restart Eliquis tomorrow from a GI                            standpoint                           - The findings and recommendations were discussed                            with the patient and/or primary team. Procedure Code(s):        --- Professional ---                           [phone], Esophagogastroduodenoscopy, flexible,                            transoral; with biopsy, single or multiple Diagnosis Code(s):        --- Professional ---                           K29.70, Gastritis, unspecified, without bleeding                           D50.9, Iron deficiency anemia, unspecified                           K92.1, Melena (includes Hematochezia) CPT copyright 2019 American Medical Association. All rights reserved. The codes documented in this report are preliminary and upon coder review may  be revised to meet current compliance requirements. Sonny Masters "Christia Reading,  10/21/2020 8:48:26 AM Number of Addenda: 0

## 2020-10-21 NOTE — Anesthesia Postprocedure Evaluation (Signed)
Anesthesia Post Note  Patient: [name]  Procedure(s) Performed: ESOPHAGOGASTRODUODENOSCOPY (EGD) WITH PROPOFOL BIOPSY     Patient location during evaluation: PACU Anesthesia Type: MAC Level of consciousness: awake and alert Pain management: pain level controlled Vital Signs Assessment: post-procedure vital signs reviewed and stable Respiratory status: spontaneous breathing, nonlabored ventilation, respiratory function stable and patient connected to nasal cannula oxygen Cardiovascular status: stable and blood pressure returned to baseline Postop Assessment: no apparent nausea or vomiting Anesthetic complications: no   No notable events documented.  Last Vitals:  Vitals:   10/21/20 0906 10/21/20 0934  BP: (!) 116/47 (!) 164/45  Pulse: (!) 54 (!) 50  Resp: (!) 24 18  Temp:  (!) 36.4 C  SpO2: 100% 100%    Last Pain:  Vitals:   10/21/20 0934  TempSrc: Oral  PainSc:                  Lateasha Breuer

## 2020-10-21 NOTE — Progress Notes (Signed)
PROGRESS NOTE    Mary Mccarthy   HMC:[id number]  DOB: 1940/11/22  DOA: 10/18/2020 PCP: Biagio Borg, MD   Brief Narrative:  Mary Mccarthy an 80 year old female with AF on Eliquis, severe aortic stenosis, hypertension, hyperlipidemia, CVA, diabetes mellitus type 2, PUD and GERD who presented to the hospital with complaints of shortness of breath that has progressed over the past 2 to 3 days. She admitted to having black stools on and off for the past 2 weeks and also taking ibuprofen intermittently for headaches. Hemoglobin 7.6, stool guaiac positive.  Subjective: She has an occipital headache today.  Continues to complain of some mild anxiety.    Assessment & Plan:   Principal Problem: Acute blood loss anemia with episodes of melena over the past 2 weeks - Eliquis and Aspirin on hold - Hemoglobin when last checked on 10/06/2020 was 10.6 - She has been transfused 1 units of packed red blood cells and hemoglobin has improved from 7.6-8.4 - EGD this morning showed gastric erosions with overlying clot to find recent bleeding-biopsies taken - Hemoglobin remains stable today -Takes Protonix daily at home - Cont Protonix BID x 8 wks- ok to resume Eliquis tomorrow per GI  Active Problems: Severe aortic stenosis - Appreciate evaluation by the TAVR team- tentatively scheduled for TAVR on Tuesday awaiting CT surgery eval -He was due for an evaluation with Dr. Cyndia Bent on 9/13 but missed this as she was in the ED  Paroxysmal atrial fibrillation - Amiodarone at home-currently on IV with plans to convert to oral after endoscopy -Continue carvedilol   Chronic diastolic heart failure - Echo from 08/10/2020 notes there is severe concentric LVH and LV diastolic parameters are indeterminate, moderate elevation in PASP -Furosemide is on hold -Continue carvedilol    Type 2 diabetes mellitus with hyperglycemia, without long-term current use of insulin  -Continue sliding scale insulin - Amaryl  and Metformin are on hold  Anxiety - Continue alprazolam as needed   Time spent in minutes: 35 DVT prophylaxis: SCDs Start: 10/19/20 1041  Code Status: Full code Family Communication:  Level of Care: Level of care: Telemetry Medical Disposition Plan:  Status is: Inpatient  Remains inpatient appropriate because:Inpatient level of care appropriate due to severity of illness  Dispo: The patient is from: Home              Anticipated d/c is to: Home              Patient currently is not medically stable to d/c.   Difficult to place patient No  Consultants:  GI Cardiology Procedures:  EGD-9/15 Antimicrobials:  Anti-infectives (From admission, onward)    None        Objective: Vitals:   10/21/20 0846 10/21/20 0850 10/21/20 0900 10/21/20 0906  BP: (!) 139/35 (!) 146/57 (!) 146/57 (!) 116/47  Pulse: (!) 52 (!) 58 (!) 54 (!) 54  Resp: (!) 24 13 (!) 21 (!) 24  Temp: 97.7 F (36.5 C)     TempSrc: Axillary     SpO2: 100% 100% 100% 100%  Weight:      Height:        Intake/Output Summary (Last 24 hours) at 10/21/2020 0917 Last data filed at 10/21/2020 0832 Gross per 24 hour  Intake 547 ml  Output 400 ml  Net 147 ml    Filed Weights   10/20/20 1533 10/21/20 0218 10/21/20 0737  Weight: 89.4 kg 89.6 kg 89.6 kg    Examination: General exam: Appears comfortable  HEENT: PERRLA, oral mucosa moist, no sclera icterus or thrush Respiratory system: Clear to auscultation. Respiratory effort normal. Cardiovascular system: S1 & S2 heard, regular rate and rhythm- 3/6 SEM Gastrointestinal system: Abdomen soft, non-tender, nondistended. Normal bowel sounds   Central nervous system: Alert and oriented. No focal neurological deficits. Extremities: No cyanosis, clubbing or edema Skin: No rashes or ulcers Psychiatry:  Mood & affect appropriate.      Data Reviewed: I have personally reviewed following labs and imaging studies  CBC: Recent Labs  Lab 10/18/20 1753  10/19/20 1300 10/19/20 2058 10/20/20 0450 10/21/20 0420  WBC 5.9  --   --  6.1 6.8  NEUTROABS 3.6  --   --   --   --   HGB 7.6* 8.5* 8.8* 8.4* 8.3*  HCT 23.4* 25.8* 26.8* 25.6* 25.4*  MCV 100.4*  --   --  95.2 95.8  PLT 204  --   --  178 592    Basic Metabolic Panel: Recent Labs  Lab 10/18/20 1753 10/19/20 1326 10/20/20 0450  NA 139  --  136  K 4.0  --  3.5  CL 103  --  103  CO2 28  --  27  GLUCOSE 92  --  129*  BUN 27*  --  20  CREATININE 1.47*  --  0.93  CALCIUM 9.3  --  9.0  MG  --  1.4*  --     GFR: Estimated Creatinine Clearance: 47.5 mL/min (by C-G formula based on SCr of 0.93 mg/dL). Liver Function Tests: No results for input(s): AST, ALT, ALKPHOS, BILITOT, PROT, ALBUMIN in the last 168 hours. No results for input(s): LIPASE, AMYLASE in the last 168 hours. No results for input(s): AMMONIA in the last 168 hours. Coagulation Profile: Recent Labs  Lab 10/19/20 0840  INR 1.3*    Cardiac Enzymes: No results for input(s): CKTOTAL, CKMB, CKMBINDEX, TROPONINI in the last 168 hours. BNP (last 3 results) No results for input(s): PROBNP in the last 8760 hours. HbA1C: No results for input(s): HGBA1C in the last 72 hours. CBG: Recent Labs  Lab 10/20/20 0757 10/20/20 1127 10/20/20 1534 10/20/20 2152 10/21/20 0651  GLUCAP 112* 116* 164* 148* 120*    Lipid Profile: No results for input(s): CHOL, HDL, LDLCALC, TRIG, CHOLHDL, LDLDIRECT in the last 72 hours. Thyroid Function Tests: No results for input(s): TSH, T4TOTAL, FREET4, T3FREE, THYROIDAB in the last 72 hours. Anemia Panel: No results for input(s): VITAMINB12, FOLATE, FERRITIN, TIBC, IRON, RETICCTPCT in the last 72 hours. Urine analysis:    Component Value Date/Time   COLORURINE YELLOW 08/10/2020 [place] 08/10/2020 0433   LABSPEC 1.013 08/10/2020 0433   PHURINE 5.0 08/10/2020 0433   GLUCOSEU >=500 (A) 08/10/2020 0433   GLUCOSEU NEGATIVE 03/18/2020 1125   HGBUR NEGATIVE 08/10/2020  0433   BILIRUBINUR NEGATIVE 08/10/2020 0433   KETONESUR NEGATIVE 08/10/2020 0433   PROTEINUR NEGATIVE 08/10/2020 0433   UROBILINOGEN 1.0 03/18/2020 1125   NITRITE NEGATIVE 08/10/2020 0433   LEUKOCYTESUR NEGATIVE 08/10/2020 0433   Sepsis Labs: $[email or web address]$ (procalcitonin:4,lacticidven:4) ) Recent Results (from the past 240 hour(s))  Culture, blood (routine x 2)     Status: None (Preliminary result)   Collection Time: 10/19/20  8:37 AM   Specimen: BLOOD  Result Value Ref Range Status   Specimen Description BLOOD RIGHT ANTECUBITAL  Final   Special Requests   Final    BOTTLES DRAWN AEROBIC AND ANAEROBIC Blood Culture results may not be optimal due to an inadequate volume  of blood received in culture bottles   Culture   Final    NO GROWTH 2 DAYS Performed at [facility], [place] [address] [place], [place] [address]    Report Status PENDING  Incomplete  Culture, blood (routine x 2)     Status: None (Preliminary result)   Collection Time: 10/19/20  8:40 AM   Specimen: BLOOD LEFT FOREARM  Result Value Ref Range Status   Specimen Description BLOOD LEFT FOREARM  Final   Special Requests   Final    BOTTLES DRAWN AEROBIC AND ANAEROBIC Blood Culture adequate volume   Culture   Final    NO GROWTH 2 DAYS Performed at [facility], [place] [address] [place], [place] [address]    Report Status PENDING  Incomplete  Resp Panel by RT-PCR (Flu A&B, Covid) Nasopharyngeal Swab     Status: None   Collection Time: 10/19/20  9:29 AM   Specimen: Nasopharyngeal Swab; Nasopharyngeal(NP) swabs in vial transport medium  Result Value Ref Range Status   SARS Coronavirus 2 by RT PCR NEGATIVE NEGATIVE Final    Comment: (NOTE) SARS-CoV-2 target nucleic acids are NOT DETECTED.  The SARS-CoV-2 RNA is generally detectable in upper respiratory specimens during the acute phase of infection. The lowest concentration of SARS-CoV-2 viral copies this assay can detect is 138 copies/mL. A negative result does  not preclude SARS-Cov-2 infection and should not be used as the sole basis for treatment or other patient management decisions. A negative result may occur with  improper specimen collection/handling, submission of specimen other than nasopharyngeal swab, presence of viral mutation(s) within the areas targeted by this assay, and inadequate number of viral copies(<138 copies/mL). A negative result must be combined with clinical observations, patient history, and epidemiological information. The expected result is Negative.  Fact Sheet for Patients:  EntrepreneurPulse.com.au  Fact Sheet for Healthcare Providers:  IncredibleEmployment.be  This test is no t yet approved or cleared by the [place] FDA and  has been authorized for detection and/or diagnosis of SARS-CoV-2 by FDA under an Emergency Use Authorization (EUA). This EUA will remain  in effect (meaning this test can be used) for the duration of the COVID-19 declaration under Section 564(b)(1) of the Act, 21 U.S.C.section 360bbb-3(b)(1), unless the authorization is terminated  or revoked sooner.       Influenza A by PCR NEGATIVE NEGATIVE Final   Influenza B by PCR NEGATIVE NEGATIVE Final    Comment: (NOTE) The Xpert Xpress SARS-CoV-2/FLU/RSV plus assay is intended as an aid in the diagnosis of influenza from Nasopharyngeal swab specimens and should not be used as a sole basis for treatment. Nasal washings and aspirates are unacceptable for Xpert Xpress SARS-CoV-2/FLU/RSV testing.  Fact Sheet for Patients: EntrepreneurPulse.com.au  Fact Sheet for Healthcare Providers: IncredibleEmployment.be  This test is not yet approved or cleared by the [place] FDA and has been authorized for detection and/or diagnosis of SARS-CoV-2 by FDA under an Emergency Use Authorization (EUA). This EUA will remain in effect (meaning this test can be used) for the  duration of the COVID-19 declaration under Section 564(b)(1) of the Act, 21 U.S.C. section 360bbb-3(b)(1), unless the authorization is terminated or revoked.  Performed at [facility], [place] [address] [place], [place] [address]          Radiology Studies: No results found.    Scheduled Meds:  atorvastatin  40 mg Oral Daily   carvedilol  6.25 mg Oral BID WC   insulin  aspart  0-5 Units Subcutaneous QHS   insulin aspart  0-9 Units Subcutaneous TID WC   [START ON 10/22/2020] pantoprazole  40 mg Intravenous Q12H   peg 3350 powder  1 kit Oral Once   sodium chloride flush  3 mL Intravenous Q12H   Continuous Infusions:  amiodarone 30 mg/hr (10/21/20 0816)   pantoprazole 8 mg/hr (10/21/20 0816)     LOS: 1 day      Debbe Odea, MD [facility] Pager: [email or web address] 10/21/2020, 9:17 AM

## 2020-10-21 NOTE — Anesthesia Procedure Notes (Signed)
Procedure Name: MAC Date/Time: 10/21/2020 8:18 AM Performed by: Moshe Salisbury, CRNA Pre-anesthesia Checklist: Patient identified, Emergency Drugs available, Suction available and Patient being monitored Patient Re-evaluated:Patient Re-evaluated prior to induction Oxygen Delivery Method: Nasal cannula Placement Confirmation: positive ETCO2 Dental Injury: Teeth and Oropharynx as per pre-operative assessment

## 2020-10-21 NOTE — Transfer of Care (Signed)
Immediate Anesthesia Transfer of Care Note  Patient: Mary Mccarthy  Procedure(s) Performed: ESOPHAGOGASTRODUODENOSCOPY (EGD) WITH PROPOFOL BIOPSY  Patient Location: Endoscopy Unit  Anesthesia Type:MAC  Level of Consciousness: awake and patient cooperative  Airway & Oxygen Therapy: Patient Spontanous Breathing and Patient connected to nasal cannula oxygen  Post-op Assessment: Report given to RN, Post -op Vital signs reviewed and stable and Patient moving all extremities  Post vital signs: Reviewed and stable  Last Vitals:  Vitals Value Taken Time  BP 139/35 10/21/20 0846  Temp 36.5 C 10/21/20 0846  Pulse 58 10/21/20 0850  Resp 13 10/21/20 0850  SpO2 100 % 10/21/20 0850  Vitals shown include unvalidated device data.  Last Pain:  Vitals:   10/21/20 0846  TempSrc: Axillary  PainSc: 0-No pain         Complications: No notable events documented.

## 2020-10-22 DIAGNOSIS — K2901 Acute gastritis with bleeding: Secondary | ICD-10-CM | POA: Diagnosis not present

## 2020-10-22 DIAGNOSIS — I10 Essential (primary) hypertension: Secondary | ICD-10-CM | POA: Diagnosis not present

## 2020-10-22 DIAGNOSIS — I350 Nonrheumatic aortic (valve) stenosis: Secondary | ICD-10-CM | POA: Diagnosis not present

## 2020-10-22 DIAGNOSIS — I509 Heart failure, unspecified: Secondary | ICD-10-CM

## 2020-10-22 DIAGNOSIS — D649 Anemia, unspecified: Secondary | ICD-10-CM | POA: Diagnosis not present

## 2020-10-22 DIAGNOSIS — I5032 Chronic diastolic (congestive) heart failure: Secondary | ICD-10-CM

## 2020-10-22 LAB — CBC
HCT: 23.2 % — ABNORMAL LOW (ref 36.0–46.0)
Hemoglobin: 7.4 g/dL — ABNORMAL LOW (ref 12.0–15.0)
MCH: 31.1 pg (ref 26.0–34.0)
MCHC: 31.9 g/dL (ref 30.0–36.0)
MCV: 97.5 fL (ref 80.0–100.0)
Platelets: 191 10*3/uL (ref 150–400)
RBC: 2.38 MIL/uL — ABNORMAL LOW (ref 3.87–5.11)
RDW: 15.1 % (ref 11.5–15.5)
WBC: 5 10*3/uL (ref 4.0–10.5)
nRBC: 0 % (ref 0.0–0.2)

## 2020-10-22 LAB — GLUCOSE, CAPILLARY
Glucose-Capillary: 124 mg/dL — ABNORMAL HIGH (ref 70–99)
Glucose-Capillary: 169 mg/dL — ABNORMAL HIGH (ref 70–99)
Glucose-Capillary: 119 mg/dL — ABNORMAL HIGH (ref 70–99)
Glucose-Capillary: 165 mg/dL — ABNORMAL HIGH (ref 70–99)

## 2020-10-22 LAB — BASIC METABOLIC PANEL
Anion gap: 11 (ref 5–15)
BUN: 17 mg/dL (ref 8–23)
CO2: 27 mmol/L (ref 22–32)
Calcium: 9.4 mg/dL (ref 8.9–10.3)
Chloride: 101 mmol/L (ref 98–111)
Creatinine, Ser: 1.01 mg/dL — ABNORMAL HIGH (ref 0.44–1.00)
GFR, Estimated: 56 mL/min — ABNORMAL LOW (ref >60)
Glucose, Bld: 126 mg/dL — ABNORMAL HIGH (ref 70–99)
Potassium: 4 mmol/L (ref 3.5–5.1)
Sodium: 139 mmol/L (ref 135–145)

## 2020-10-22 LAB — SURGICAL PATHOLOGY

## 2020-10-22 LAB — PREPARE RBC (CROSSMATCH)

## 2020-10-22 MED ORDER — NYSTATIN 100000 UNIT/GM EX CREA
TOPICAL_CREAM | Freq: Two times a day (BID) | CUTANEOUS | Status: DC
  Administered 2020-10-22 – 2020-10-27 (×5): 1 via TOPICAL
  Filled 2020-10-22 (×2): qty 15

## 2020-10-22 MED ORDER — SODIUM CHLORIDE 0.9% IV SOLUTION: Freq: Once | INTRAVENOUS | Status: AC

## 2020-10-22 MED ORDER — CARVEDILOL 3.125 MG PO TABS
3.1250 mg | ORAL_TABLET | Freq: Two times a day (BID) | ORAL | Status: DC
  Filled 2020-10-22: qty 1

## 2020-10-22 MED ORDER — FUROSEMIDE 10 MG/ML IJ SOLN
20.0000 mg | Freq: Once | INTRAMUSCULAR | Status: AC
  Administered 2020-10-22: 20 mg via INTRAVENOUS
  Filled 2020-10-22: qty 2

## 2020-10-22 NOTE — Progress Notes (Addendum)
Progress Note  Patient Name: Mary Mccarthy Date of Encounter: 10/22/2020  [facility] Cardiologist: Bryan Lemma, MD   Subjective   No chest pain or shortness of breath.  Getting tired and sitting in bed.  Hemoglobin dropped  Inpatient Medications    Scheduled Meds:  sodium chloride   Intravenous Once   amiodarone  200 mg Oral BID   atorvastatin  40 mg Oral Daily   carvedilol  6.25 mg Oral BID WC   fluconazole  200 mg Oral Daily   furosemide  20 mg Intravenous Once   insulin aspart  0-5 Units Subcutaneous QHS   insulin aspart  0-9 Units Subcutaneous TID WC   pantoprazole  40 mg Oral BID AC   sodium chloride flush  3 mL Intravenous Q12H   Continuous Infusions:  PRN Meds: acetaminophen **OR** acetaminophen, albuterol, ALPRAZolam, ondansetron **OR** ondansetron (ZOFRAN) IV   Vital Signs    Vitals:   10/21/20 1630 10/21/20 1937 10/22/20 0333 10/22/20 0736  BP: (!) 150/47 (!) 170/56 (!) 149/45 (!) 169/50  Pulse: (!) 52 (!) 53 (!) 51 (!) 57  Resp: 16 16 18 18   Temp: 98.3 F (36.8 C) 97.7 F (36.5 C) (!) 97.5 F (36.4 C) 97.6 F (36.4 C)  TempSrc: Oral Oral Oral Oral  SpO2: 100% 100% 100% 94%  Weight:   90 kg   Height:        Intake/Output Summary (Last 24 hours) at 10/22/2020 1008 Last data filed at 10/22/2020 0817 Gross per 24 hour  Intake 1845.93 ml  Output 825 ml  Net 1020.93 ml   Last 3 Weights 10/22/2020 10/21/2020 10/21/2020  Weight (lbs) 198 lb 6.4 oz 197 lb 8.5 oz 197 lb 8 oz  Weight (kg) 89.994 kg 89.6 kg 89.585 kg      Telemetry    Normal sinus rhythm- Personally Reviewed  ECG    No new tracing  Physical Exam   GEN: No acute distress.   Neck: No JVD Cardiac: RRR, systolic murmurs, rubs, or gallops.  Respiratory: Clear to auscultation bilaterally. GI: Soft, nontender, non-distended  MS: No edema; No deformity. Neuro:  Nonfocal  Psych: Normal affect   Labs    High Sensitivity Troponin:   Recent Labs  Lab 09/27/20 0705  09/27/20 0935 10/19/20 0915 10/19/20 1326  TROPONINIHS 77* 74* 45* 45*     Chemistry Recent Labs  Lab 10/19/20 1326 10/20/20 0450 10/21/20 1120 10/22/20 0229  NA  --  136 139 139  K  --  3.5 4.0 4.0  CL  --  103 104 101  CO2  --  27 28 27   GLUCOSE  --  129* 140* 126*  BUN  --  20 11 17   CREATININE  --  0.93 0.98 1.01*  CALCIUM  --  9.0 9.2 9.4  MG 1.4*  --   --   --   GFRNONAA  --  >60 58* 56*  ANIONGAP  --  6 7 11     Lipids No results for input(s): CHOL, TRIG, HDL, LABVLDL, LDLCALC, CHOLHDL in the last 168 hours.  Hematology Recent Labs  Lab 10/20/20 0450 10/21/20 0420 10/22/20 0752  WBC 6.1 6.8 5.0  RBC 2.69* 2.65* 2.38*  HGB 8.4* 8.3* 7.4*  HCT 25.6* 25.4* 23.2*  MCV 95.2 95.8 97.5  MCH 31.2 31.3 31.1  MCHC 32.8 32.7 31.9  RDW 15.7* 15.4 15.1  PLT 178 188 191   Thyroid No results for input(s): TSH, FREET4 in the last 168 hours.  BNP Recent Labs  Lab 10/18/20 1753  BNP 217.6*    DDimer No results for input(s): DDIMER in the last 168 hours.   Radiology    No results found.  Cardiac Studies   None this admission  Patient Profile     80 y.o. female with a history of obesity, DMT2, non obstructive CAD, PUD/AVM, PAF on amio and Eliquis (initially diagnosed at the time of acute CVA in 08/2020), HTN, pulmonary HTN, chronic diastolic CHF with recent admission 09/2020 for acute CHF and diagnosed with severe AS with plans for TAVR who presented to [facility] ED on 10/19/20 for evaluation of dyspnea and found to have acute anemia with GI bleeding. Cardiology was consulted for pre operative clearance prior to GI evaluation with colonoscopy/EGD and discussion about timing for TAVR surgery.  Assessment & Plan    PAF - Converted to sinus on IV amiodarone.  Now on amiodarone 200mg  BID x 7 days and then 200mg  daily.  - Eliquis on hold>>> plan was to resume today however given drop in hemoglobin we will continue to hold Eliquis.   2. Severe AS - Pending TVAR. Seen by Dr.  yesterday>> see consult note for recommendations. Dr. to see today, likely keep in hospital until TAVR next week given drop in hemoglobin.   3. Chronic diastolic CHF - BNP mildly elevated, CXR without significant edema. Does not appear to be significantly volume overloaded on exam. Watch fluid status closely.  -Plan to give dose of Lasix with transfusion   4. Acute blood loss anemia with melena - ASA and Eliquis on hold - s/p transfused 1 unit - EGD with gastritis and felt bleeding from gastric erosion  -Hemoglobin dropped again to 7.4.  Transfusion planned for today.  Keep hemoglobin above 8.  Will get cardiac rehab to avoid deconditioning.  Ambulate and transfer to chair.    For questions or updates, please contact [facility] Please consult [email or web address] for contact info under        Signed, Excell Seltzer, PA  10/22/2020, 10:08 AM     Agree with note by Manson Passey PA-C  Ms. Milliman was admitted with SVT, A. fib with RVR which ultimately converted to sinus rhythm.  She has severe aortic stenosis scheduled to have TAVR this coming Tuesday with Drs. Bartle  and Chelsea Aus she was clinically improved in sinus rhythm.  Her hemoglobin did drop and she underwent EGD that revealed gastritis with an overlying thrombus.  She was transfused.  This morning her hemoglobin dropped to 7.4.  Agree with transfusion of 1 unit of packed red blood cells.  Hold Eliquis, Dr. Sunday to see today.  I suspect that she should be kept in the hospital until her intervention on Tuesday.   Laneta Simmers, M.D., FACP, [facility], [place] [facility] [address] [place], [address]  [place]  [phone] 10/22/2020 10:58 AM

## 2020-10-22 NOTE — Progress Notes (Signed)
Mobility Specialist Progress Note   10/22/20 1050  Mobility  Activity Ambulated in room  Level of Assistance Minimal assist, patient does 75% or more  Assistive Device  (HHA)  Distance Ambulated (ft) 144 ft  Mobility Ambulated with assistance in room  Mobility Response Tolerated well  Mobility performed by Mobility specialist  $Mobility charge 1 Mobility   Pt received laying in bed w/ no sx. Agreeable to mobility session, used BR prior to start w/ min A. Mod I sit <> stand w/ HHA. Pt did laps in room, 1 lap (chair by window to door) = 7ft, 6.5 laps in total. Pt's gait slightly unsteady during session w/ x1 LOB while turning. Otherwise, mod I w/ comfort from HHA. x1 break taken during session d/t weakness, denies SOB or any pain. Pt returned in front of mirror in prep for bath. RN notified and call bell placed by side.       Pre Mobility: 63 HR During Mobility: 74 HR Post Mobility: 58 HR  Judeen Hammans Phone Number [phone]

## 2020-10-22 NOTE — Progress Notes (Signed)
PROGRESS NOTE    Mary Mccarthy   YPP:[id number]  DOB: 1940/05/16  DOA: 10/18/2020 PCP: Mary Levins, MD   Brief Narrative:  Mary Mccarthy an 80 year old female with AF on Eliquis, severe aortic stenosis, hypertension, hyperlipidemia, CVA, diabetes mellitus type 2, PUD and GERD who presented to the hospital with complaints of shortness of breath that has progressed over the past 2 to 3 days. She admitted to having black stools on and off for the past 2 weeks and also taking ibuprofen intermittently for headaches. Hemoglobin 7.6, stool guaiac positive.  Subjective: She continues to complain of anxiety which she feels is at her baseline. No BMs after EGD. She is tolerating a regular diet and eating most of her food.   Assessment & Plan:   Principal Problem: Acute blood loss anemia with episodes of melena over the past 2 weeks - Eliquis and Aspirin on hold - Hemoglobin when last checked on 10/06/2020 was 10.6 - She has been transfused 1 units of packed red blood cells and hemoglobin has improved from 7.6-8.4 - EGD this morning showed gastric erosions with overlying clot to find recent bleeding-biopsies taken - Hemoglobin has dropped 7.4-  transfusing 1 u PRBC today -Takes Protonix daily at home - Cont Protonix BID x 8 wks-  - cont to hold Eliquis and follow Hb  Active Problems: Severe aortic stenosis - Appreciate evaluation by the TAVR team- tentatively scheduled for TAVR on Tuesday awaiting CT surgery eval -She was due for an evaluation with Mary Mccarthy on 9/13 but missed this as she was in the ED  Paroxysmal atrial fibrillation - Amiodarone and carvedilol per cardiology   Chronic diastolic heart failure - Echo from 08/10/2020 notes there is severe concentric LVH and LV diastolic parameters are indeterminate, moderate elevation in PASP -Furosemide is on hold -Continue carvedilol    Type 2 diabetes mellitus with hyperglycemia, without long-term current use of insulin  -Continue  sliding scale insulin - Amaryl and Metformin are on hold  Anxiety - Continue alprazolam as needed   Time spent in minutes: 35 DVT prophylaxis: SCDs Start: 10/19/20 1041  Code Status: Full code Family Communication:  Level of Care: Level of care: Telemetry Medical Disposition Plan:  Status is: Inpatient  Remains inpatient appropriate because:Inpatient level of care appropriate due to severity of illness  Dispo: The patient is from: Home              Anticipated d/c is to: Home              Patient currently is not medically stable to d/c.   Difficult to place patient No  Consultants:  GI Cardiology Procedures:  EGD-9/15 Antimicrobials:  Anti-infectives (From admission, onward)    Start     Dose/Rate Route Frequency Ordered Stop   10/21/20 1145  fluconazole (DIFLUCAN) tablet 200 mg        200 mg Oral Daily 10/21/20 1100 10/28/20 0959        Objective: Vitals:   10/22/20 1149 10/22/20 1225 10/22/20 1227 10/22/20 1442  BP: (!) 133/48 (!) 137/45 (!) 137/45 (!) 167/51  Pulse: (!) 48 (!) 49 (!) 49 (!) 50  Resp: 16 16 16 20   Temp: 97.7 F (36.5 C) 98.5 F (36.9 C) 98.5 F (36.9 C) 98.4 F (36.9 C)  TempSrc: Oral  Oral Oral  SpO2: 99%  99% 99%  Weight:      Height:        Intake/Output Summary (Last 24 hours) at 10/22/2020 1639  Last data filed at 10/22/2020 1444 Gross per 24 hour  Intake 766 ml  Output --  Net 766 ml    Filed Weights   10/21/20 0218 10/21/20 0737 10/22/20 0333  Weight: 89.6 kg 89.6 kg 90 kg    Examination: General exam: Appears comfortable  HEENT: PERRLA, oral mucosa moist, no sclera icterus or thrush Respiratory system: Clear to auscultation. Respiratory effort normal. Cardiovascular system: S1 & S2 heard, regular rate and rhythm- 3/6 SEM Gastrointestinal system: Abdomen soft, non-tender, nondistended. Normal bowel sounds   Central nervous system: Alert and oriented. No focal neurological deficits. Extremities: No cyanosis, clubbing or  edema Skin: No rashes or ulcers Psychiatry:  Mood & affect appropriate.      Data Reviewed: I have personally reviewed following labs and imaging studies  CBC: Recent Labs  Lab 10/18/20 1753 10/19/20 1300 10/19/20 2058 10/20/20 0450 10/21/20 0420 10/22/20 0752  WBC 5.9  --   --  6.1 6.8 5.0  NEUTROABS 3.6  --   --   --   --   --   HGB 7.6* 8.5* 8.8* 8.4* 8.3* 7.4*  HCT 23.4* 25.8* 26.8* 25.6* 25.4* 23.2*  MCV 100.4*  --   --  95.2 95.8 97.5  PLT 204  --   --  178 188 191    Basic Metabolic Panel: Recent Labs  Lab 10/18/20 1753 10/19/20 1326 10/20/20 0450 10/21/20 1120 10/22/20 0229  NA 139  --  136 139 139  K 4.0  --  3.5 4.0 4.0  CL 103  --  103 104 101  CO2 28  --  27 28 27   GLUCOSE 92  --  129* 140* 126*  BUN 27*  --  20 11 17   CREATININE 1.47*  --  0.93 0.98 1.01*  CALCIUM 9.3  --  9.0 9.2 9.4  MG  --  1.4*  --   --   --     GFR: Estimated Creatinine Clearance: 43.9 mL/min (A) (by C-G formula based on SCr of 1.01 mg/dL (H)). Liver Function Tests: No results for input(s): AST, ALT, ALKPHOS, BILITOT, PROT, ALBUMIN in the last 168 hours. No results for input(s): LIPASE, AMYLASE in the last 168 hours. No results for input(s): AMMONIA in the last 168 hours. Coagulation Profile: Recent Labs  Lab 10/19/20 0840  INR 1.3*    Cardiac Enzymes: No results for input(s): CKTOTAL, CKMB, CKMBINDEX, TROPONINI in the last 168 hours. BNP (last 3 results) No results for input(s): PROBNP in the last 8760 hours. HbA1C: No results for input(s): HGBA1C in the last 72 hours. CBG: Recent Labs  Lab 10/21/20 1630 10/21/20 2054 10/22/20 0545 10/22/20 1119 10/22/20 1540  GLUCAP 172* 137* 124* 169* 119*    Lipid Profile: No results for input(s): CHOL, HDL, LDLCALC, TRIG, CHOLHDL, LDLDIRECT in the last 72 hours. Thyroid Function Tests: No results for input(s): TSH, T4TOTAL, FREET4, T3FREE, THYROIDAB in the last 72 hours. Anemia Panel: No results for input(s):  VITAMINB12, FOLATE, FERRITIN, TIBC, IRON, RETICCTPCT in the last 72 hours. Urine analysis:    Component Value Date/Time   COLORURINE YELLOW 08/10/2020 0433   APPEARANCEUR CLEAR 08/10/2020 0433   LABSPEC 1.013 08/10/2020 0433   PHURINE 5.0 08/10/2020 0433   GLUCOSEU >=500 (A) 08/10/2020 0433   GLUCOSEU NEGATIVE 03/18/2020 1125   HGBUR NEGATIVE 08/10/2020 0433   BILIRUBINUR NEGATIVE 08/10/2020 0433   KETONESUR NEGATIVE 08/10/2020 0433   PROTEINUR NEGATIVE 08/10/2020 0433   UROBILINOGEN 1.0 03/18/2020 1125   NITRITE NEGATIVE  08/10/2020 0433   LEUKOCYTESUR NEGATIVE 08/10/2020 0433   Sepsis Labs: @LABRCNTIP (procalcitonin:4,lacticidven:4) ) Recent Results (from the past 240 hour(s))  Culture, blood (routine x 2)     Status: None (Preliminary result)   Collection Time: 10/19/20  8:37 AM   Specimen: BLOOD  Result Value Ref Range Status   Specimen Description BLOOD RIGHT ANTECUBITAL  Final   Special Requests   Final    BOTTLES DRAWN AEROBIC AND ANAEROBIC Blood Culture results may not be optimal due to an inadequate volume of blood received in culture bottles   Culture   Final    NO GROWTH 3 DAYS Performed at [facility], [address] [place], [place]    Report Status PENDING  Incomplete  Culture, blood (routine x 2)     Status: None (Preliminary result)   Collection Time: 10/19/20  8:40 AM   Specimen: BLOOD LEFT FOREARM  Result Value Ref Range Status   Specimen Description BLOOD LEFT FOREARM  Final   Special Requests   Final    BOTTLES DRAWN AEROBIC AND ANAEROBIC Blood Culture adequate volume   Culture   Final    NO GROWTH 3 DAYS Performed at [facility], [address] [place], [place]    Report Status PENDING  Incomplete  Resp Panel by RT-PCR (Flu A&B, Covid) Nasopharyngeal Swab     Status: None   Collection Time: 10/19/20  9:29 AM   Specimen: Nasopharyngeal Swab; Nasopharyngeal(NP) swabs in vial transport medium  Result Value Ref Range  Status   SARS Coronavirus 2 by RT PCR NEGATIVE NEGATIVE Final    Comment: (NOTE) SARS-CoV-2 target nucleic acids are NOT DETECTED.  The SARS-CoV-2 RNA is generally detectable in upper respiratory specimens during the acute phase of infection. The lowest concentration of SARS-CoV-2 viral copies this assay can detect is 138 copies/mL. A negative result does not preclude SARS-Cov-2 infection and should not be used as the sole basis for treatment or other patient management decisions. A negative result may occur with  improper specimen collection/handling, submission of specimen other than nasopharyngeal swab, presence of viral mutation(s) within the areas targeted by this assay, and inadequate number of viral copies(<138 copies/mL). A negative result must be combined with clinical observations, patient history, and epidemiological information. The expected result is Negative.  Fact Sheet for Patients:  10/21/20  Fact Sheet for Healthcare Providers:  BloggerCourse.com  This test is no t yet approved or cleared by the SeriousBroker.[place] FDA and  has been authorized for detection and/or diagnosis of SARS-CoV-2 by FDA under an Emergency Use Authorization (EUA). This EUA will remain  in effect (meaning this test can be used) for the duration of the COVID-19 declaration under Section 564(b)(1) of the Act, 21 U.S.C.section 360bbb-3(b)(1), unless the authorization is terminated  or revoked sooner.       Influenza A by PCR NEGATIVE NEGATIVE Final   Influenza B by PCR NEGATIVE NEGATIVE Final    Comment: (NOTE) The Xpert Xpress SARS-CoV-2/FLU/RSV plus assay is intended as an aid in the diagnosis of influenza from Nasopharyngeal swab specimens and should not be used as a sole basis for treatment. Nasal washings and aspirates are unacceptable for Xpert Xpress SARS-CoV-2/FLU/RSV testing.  Fact Sheet for  Patients: [place]  Fact Sheet for Healthcare Providers: BloggerCourse.com  This test is not yet approved or cleared by the SeriousBroker.it FDA and has been authorized for detection and/or diagnosis of SARS-CoV-2 by FDA under an Emergency Use Authorization (  EUA). This EUA will remain in effect (meaning this test can be used) for the duration of the COVID-19 declaration under Section 564(b)(1) of the Act, 21 U.S.C. section 360bbb-3(b)(1), unless the authorization is terminated or revoked.  Performed at [facility], [address] [place], [place] [address]          Radiology Studies: No results found.    Scheduled Meds:  amiodarone  200 mg Oral BID   atorvastatin  40 mg Oral Daily   carvedilol  3.125 mg Oral BID WC   fluconazole  200 mg Oral Daily   insulin aspart  0-5 Units Subcutaneous QHS   insulin aspart  0-9 Units Subcutaneous TID WC   pantoprazole  40 mg Oral BID AC   sodium chloride flush  3 mL Intravenous Q12H   Continuous Infusions:     LOS: 2 days      Calvert Cantor, MD [facility] Pager: [email or web address] 10/22/2020, 4:39 PM

## 2020-10-22 NOTE — Progress Notes (Signed)
1328-1400 Came to see to walk but pt getting transfusion. Dr Laneta Simmers in to see pt. Put washclothes under breast folds to keep dry.  Changed pad under pt as a little wet. Discussed with pt that CR will see pt with  Mobility team to strengthen her prior to TAVR. Will continue to follow. Pt is motivated to walk.  Luetta Nutting RN BSN 10/22/2020 2:02 PM

## 2020-10-23 DIAGNOSIS — Z20822 Contact with and (suspected) exposure to covid-19: Secondary | ICD-10-CM | POA: Diagnosis not present

## 2020-10-23 DIAGNOSIS — D62 Acute posthemorrhagic anemia: Secondary | ICD-10-CM | POA: Diagnosis not present

## 2020-10-23 DIAGNOSIS — Z006 Encounter for examination for normal comparison and control in clinical research program: Secondary | ICD-10-CM | POA: Diagnosis not present

## 2020-10-23 DIAGNOSIS — D649 Anemia, unspecified: Secondary | ICD-10-CM | POA: Diagnosis not present

## 2020-10-23 DIAGNOSIS — K254 Chronic or unspecified gastric ulcer with hemorrhage: Secondary | ICD-10-CM | POA: Diagnosis not present

## 2020-10-23 LAB — BASIC METABOLIC PANEL
Anion gap: 10 (ref 5–15)
BUN: 17 mg/dL (ref 8–23)
CO2: 27 mmol/L (ref 22–32)
Calcium: 9.5 mg/dL (ref 8.9–10.3)
Chloride: 101 mmol/L (ref 98–111)
Creatinine, Ser: 1 mg/dL (ref 0.44–1.00)
GFR, Estimated: 56 mL/min — ABNORMAL LOW (ref >60)
Glucose, Bld: 131 mg/dL — ABNORMAL HIGH (ref 70–99)
Potassium: 3.7 mmol/L (ref 3.5–5.1)
Sodium: 138 mmol/L (ref 135–145)

## 2020-10-23 LAB — TYPE AND SCREEN
ABO/RH(D): A POS
Antibody Screen: NEGATIVE
Unit division: 0
Unit division: 0

## 2020-10-23 LAB — BPAM RBC
Blood Product Expiration Date: 202209232359
Blood Product Expiration Date: 202210052359
ISSUE DATE / TIME: 202209131019
ISSUE DATE / TIME: 202209161207
Unit Type and Rh: 6200
Unit Type and Rh: 6200

## 2020-10-23 LAB — CBC
HCT: 25.7 % — ABNORMAL LOW (ref 36.0–46.0)
Hemoglobin: 8.6 g/dL — ABNORMAL LOW (ref 12.0–15.0)
MCH: 31 pg (ref 26.0–34.0)
MCHC: 33.5 g/dL (ref 30.0–36.0)
MCV: 92.8 fL (ref 80.0–100.0)
Platelets: 198 10*3/uL (ref 150–400)
RBC: 2.77 MIL/uL — ABNORMAL LOW (ref 3.87–5.11)
RDW: 15.8 % — ABNORMAL HIGH (ref 11.5–15.5)
WBC: 6.1 10*3/uL (ref 4.0–10.5)
nRBC: 0 % (ref 0.0–0.2)

## 2020-10-23 LAB — GLUCOSE, CAPILLARY
Glucose-Capillary: 127 mg/dL — ABNORMAL HIGH (ref 70–99)
Glucose-Capillary: 163 mg/dL — ABNORMAL HIGH (ref 70–99)
Glucose-Capillary: 145 mg/dL — ABNORMAL HIGH (ref 70–99)
Glucose-Capillary: 142 mg/dL — ABNORMAL HIGH (ref 70–99)

## 2020-10-23 MED ORDER — POTASSIUM CHLORIDE CRYS ER 20 MEQ PO TBCR
20.0000 meq | EXTENDED_RELEASE_TABLET | Freq: Every day | ORAL | Status: DC
  Administered 2020-10-23 – 2020-10-28 (×5): 20 meq via ORAL
  Filled 2020-10-23 (×5): qty 1

## 2020-10-23 MED ORDER — MAGNESIUM SULFATE 4 GM/100ML IV SOLN
4.0000 g | Freq: Once | INTRAVENOUS | Status: AC
  Administered 2020-10-23: 4 g via INTRAVENOUS
  Filled 2020-10-23: qty 100

## 2020-10-23 MED ORDER — FUROSEMIDE 40 MG PO TABS
40.0000 mg | ORAL_TABLET | Freq: Every day | ORAL | Status: DC
  Administered 2020-10-23 – 2020-10-28 (×5): 40 mg via ORAL
  Filled 2020-10-23 (×5): qty 1

## 2020-10-23 NOTE — Progress Notes (Signed)
CARDIAC REHAB PHASE I   PRE:  Rate/Rhythm: 54 SB  BP:  Sitting: 165/64      SaO2: 97 RA  Went to try to walk pt. Pt declined my offer to walk. Will continue to follow.  [phone] Joya San, MS, ACSM-CEP 10/23/2020 10:59 AM

## 2020-10-23 NOTE — Progress Notes (Signed)
PROGRESS NOTE    Mary Mccarthy   IEP:[id number]  DOB: October 12, 1940  DOA: 10/18/2020 PCP: Corwin Levins, MD   Brief Narrative:  [name] an 80 year old female with AF on Eliquis, severe aortic stenosis, hypertension, hyperlipidemia, CVA, diabetes mellitus type 2, PUD and GERD who presented to the hospital with complaints of shortness of breath that has progressed over the past 2 to 3 days. She admitted to having black stools on and off for the past 2 weeks and also taking ibuprofen intermittently for headaches. Hemoglobin 7.6, stool guaiac positive.  Subjective:  No new complaints.   Assessment & Plan:   Principal Problem: Acute blood loss anemia with episodes of melena over the past 2 weeks - Eliquis and Aspirin on hold - Hemoglobin when last checked on 10/06/2020 was 10.6 - She has been transfused 1 units of packed red blood cells and hemoglobin has improved from 7.6-8.4 - 9/15> EGD showed gastric erosions with overlying clot -biopsies taken - Hemoglobin has dropped 7.4-  transfused 1 u PRBC  on 9/17- Hgb improved to 8.6 today.  -Takes Protonix daily at home - Cont Protonix BID x 8 wks-  - cont to hold Eliquis and follow Hb  Active Problems: Severe aortic stenosis - Appreciate evaluation by the TAVR team- tentatively scheduled for TAVR on Tuesday    Paroxysmal atrial fibrillation with bradycardia - Amiodarone and carvedilol per cardiology   Chronic diastolic heart failure - Echo from 08/10/2020 notes there is severe concentric LVH and LV diastolic parameters are indeterminate, moderate elevation in PASP -Furosemide is on hold     Type 2 diabetes mellitus with hyperglycemia, without long-term current use of insulin  -Continue sliding scale insulin - Amaryl and Metformin are on hold  Anxiety - Continue alprazolam as needed   Time spent in minutes: 35 DVT prophylaxis: SCDs Start: 10/19/20 1041  Code Status: Full code Family Communication:  Level of Care: Level of  care: Telemetry Medical Disposition Plan:  Status is: Inpatient  Remains inpatient appropriate because:Inpatient level of care appropriate due to severity of illness  Dispo: The patient is from: Home              Anticipated d/c is to: Home              Patient currently is not medically stable to d/c.   Difficult to place patient No  Consultants:  GI Cardiology Procedures:  EGD-9/15 Antimicrobials:  Anti-infectives (From admission, onward)    Start     Dose/Rate Route Frequency Ordered Stop   10/21/20 1145  fluconazole (DIFLUCAN) tablet 200 mg        200 mg Oral Daily 10/21/20 1100 10/28/20 0959        Objective: Vitals:   10/22/20 1442 10/22/20 1929 10/23/20 0456 10/23/20 0500  BP: (!) 167/51 (!) 173/48 (!) 161/57   Pulse: (!) 50 60 60   Resp: 20 18 18    Temp: 98.4 F (36.9 C) 98.3 F (36.8 C) 98.3 F (36.8 C)   TempSrc: Oral Oral Oral   SpO2: 99% 100% 97%   Weight:    89.8 kg  Height:        Intake/Output Summary (Last 24 hours) at 10/23/2020 0741 Last data filed at 10/23/2020 0500 Gross per 24 hour  Intake 769 ml  Output 1300 ml  Net -531 ml    Filed Weights   10/21/20 0737 10/22/20 0333 10/23/20 0500  Weight: 89.6 kg 90 kg 89.8 kg    Examination: General  exam: Appears comfortable  HEENT: PERRLA, oral mucosa moist, no sclera icterus or thrush Respiratory system: Clear to auscultation. Respiratory effort normal. Cardiovascular system: S1 & S2 heard, regular rate and rhythm- 3/6 SEM Gastrointestinal system: Abdomen soft, non-tender, nondistended. Normal bowel sounds   Central nervous system: Alert and oriented. No focal weakness Extremities: No cyanosis, clubbing or edema Skin: No rashes or ulcers Psychiatry:  Mood & affect appropriate.      Data Reviewed: I have personally reviewed following labs and imaging studies  CBC: Recent Labs  Lab 10/18/20 1753 10/19/20 1300 10/19/20 2058 10/20/20 0450 10/21/20 0420 10/22/20 0752 10/23/20 0452   WBC 5.9  --   --  6.1 6.8 5.0 6.1  NEUTROABS 3.6  --   --   --   --   --   --   HGB 7.6*   < > 8.8* 8.4* 8.3* 7.4* 8.6*  HCT 23.4*   < > 26.8* 25.6* 25.4* 23.2* 25.7*  MCV 100.4*  --   --  95.2 95.8 97.5 92.8  PLT 204  --   --  178 188 191 198   < > = values in this interval not displayed.    Basic Metabolic Panel: Recent Labs  Lab 10/18/20 1753 10/19/20 1326 10/20/20 0450 10/21/20 1120 10/22/20 0229 10/23/20 0452  NA 139  --  136 139 139 138  K 4.0  --  3.5 4.0 4.0 3.7  CL 103  --  103 104 101 101  CO2 28  --  27 28 27 27   GLUCOSE 92  --  129* 140* 126* 131*  BUN 27*  --  20 11 17 17   CREATININE 1.47*  --  0.93 0.98 1.01* 1.00  CALCIUM 9.3  --  9.0 9.2 9.4 9.5  MG  --  1.4*  --   --   --   --     GFR: Estimated Creatinine Clearance: 44.2 mL/min (by C-G formula based on SCr of 1 mg/dL). Liver Function Tests: No results for input(s): AST, ALT, ALKPHOS, BILITOT, PROT, ALBUMIN in the last 168 hours. No results for input(s): LIPASE, AMYLASE in the last 168 hours. No results for input(s): AMMONIA in the last 168 hours. Coagulation Profile: Recent Labs  Lab 10/19/20 0840  INR 1.3*    Cardiac Enzymes: No results for input(s): CKTOTAL, CKMB, CKMBINDEX, TROPONINI in the last 168 hours. BNP (last 3 results) No results for input(s): PROBNP in the last 8760 hours. HbA1C: No results for input(s): HGBA1C in the last 72 hours. CBG: Recent Labs  Lab 10/22/20 0545 10/22/20 1119 10/22/20 1540 10/22/20 2105 10/23/20 0554  GLUCAP 124* 169* 119* 165* 127*    Lipid Profile: No results for input(s): CHOL, HDL, LDLCALC, TRIG, CHOLHDL, LDLDIRECT in the last 72 hours. Thyroid Function Tests: No results for input(s): TSH, T4TOTAL, FREET4, T3FREE, THYROIDAB in the last 72 hours. Anemia Panel: No results for input(s): VITAMINB12, FOLATE, FERRITIN, TIBC, IRON, RETICCTPCT in the last 72 hours. Urine analysis:    Component Value Date/Time   COLORURINE YELLOW 08/10/2020 0433    APPEARANCEUR CLEAR 08/10/2020 0433   LABSPEC 1.013 08/10/2020 0433   PHURINE 5.0 08/10/2020 0433   GLUCOSEU >=500 (A) 08/10/2020 0433   GLUCOSEU NEGATIVE 03/18/2020 1125   HGBUR NEGATIVE 08/10/2020 0433   BILIRUBINUR NEGATIVE 08/10/2020 0433   KETONESUR NEGATIVE 08/10/2020 0433   PROTEINUR NEGATIVE 08/10/2020 0433   UROBILINOGEN 1.0 03/18/2020 1125   NITRITE NEGATIVE 08/10/2020 0433   LEUKOCYTESUR NEGATIVE 08/10/2020 0433  Sepsis Labs: @LABRCNTIP (procalcitonin:4,lacticidven:4) ) Recent Results (from the past 240 hour(s))  Culture, blood (routine x 2)     Status: None (Preliminary result)   Collection Time: 10/19/20  8:37 AM   Specimen: BLOOD  Result Value Ref Range Status   Specimen Description BLOOD RIGHT ANTECUBITAL  Final   Special Requests   Final    BOTTLES DRAWN AEROBIC AND ANAEROBIC Blood Culture results may not be optimal due to an inadequate volume of blood received in culture bottles   Culture   Final    NO GROWTH 3 DAYS Performed at [facility], [address] [place], [place]    Report Status PENDING  Incomplete  Culture, blood (routine x 2)     Status: None (Preliminary result)   Collection Time: 10/19/20  8:40 AM   Specimen: BLOOD LEFT FOREARM  Result Value Ref Range Status   Specimen Description BLOOD LEFT FOREARM  Final   Special Requests   Final    BOTTLES DRAWN AEROBIC AND ANAEROBIC Blood Culture adequate volume   Culture   Final    NO GROWTH 3 DAYS Performed at [facility], [address], [place], [place]    Report Status PENDING  Incomplete  Resp Panel by RT-PCR (Flu A&B, Covid) Nasopharyngeal Swab     Status: None   Collection Time: 10/19/20  9:29 AM   Specimen: Nasopharyngeal Swab; Nasopharyngeal(NP) swabs in vial transport medium  Result Value Ref Range Status   SARS Coronavirus 2 by RT PCR NEGATIVE NEGATIVE Final    Comment: (NOTE) SARS-CoV-2 target nucleic acids are NOT DETECTED.  The SARS-CoV-2 RNA is  generally detectable in upper respiratory specimens during the acute phase of infection. The lowest concentration of SARS-CoV-2 viral copies this assay can detect is 138 copies/mL. A negative result does not preclude SARS-Cov-2 infection and should not be used as the sole basis for treatment or other patient management decisions. A negative result may occur with  improper specimen collection/handling, submission of specimen other than nasopharyngeal swab, presence of viral mutation(s) within the areas targeted by this assay, and inadequate number of viral copies(<138 copies/mL). A negative result must be combined with clinical observations, patient history, and epidemiological information. The expected result is Negative.  Fact Sheet for Patients:  10/21/20  Fact Sheet for Healthcare Providers:  BloggerCourse.com  This test is no t yet approved or cleared by the [employer].[place] FDA and  has been authorized for detection and/or diagnosis of SARS-CoV-2 by FDA under an Emergency Use Authorization (EUA). This EUA will remain  in effect (meaning this test can be used) for the duration of the COVID-19 declaration under Section 564(b)(1) of the Act, 21 U.S.C.section 360bbb-3(b)(1), unless the authorization is terminated  or revoked sooner.       Influenza A by PCR NEGATIVE NEGATIVE Final   Influenza B by PCR NEGATIVE NEGATIVE Final    Comment: (NOTE) The Xpert Xpress SARS-CoV-2/FLU/RSV plus assay is intended as an aid in the diagnosis of influenza from Nasopharyngeal swab specimens and should not be used as a sole basis for treatment. Nasal washings and aspirates are unacceptable for Xpert Xpress SARS-CoV-2/FLU/RSV testing.  Fact Sheet for Patients: [place]  Fact Sheet for Healthcare Providers: BloggerCourse.com  This test is not yet approved or cleared by the [employer]ious[employer] FDA and has been authorized for detection and/or diagnosis of SARS-CoV-2 by FDA under an Emergency Use Authorization (EUA). This EUA will remain in effect (meaning this test  can be used) for the duration of the COVID-19 declaration under Section 564(b)(1) of the Act, 21 U.S.C. section 360bbb-3(b)(1), unless the authorization is terminated or revoked.  Performed at [facility], [address] [place], [place] [address]          Radiology Studies: No results found.    Scheduled Meds:  amiodarone  200 mg Oral BID   atorvastatin  40 mg Oral Daily   carvedilol  3.125 mg Oral BID WC   fluconazole  200 mg Oral Daily   insulin aspart  0-5 Units Subcutaneous QHS   insulin aspart  0-9 Units Subcutaneous TID WC   nystatin cream   Topical BID   pantoprazole  40 mg Oral BID AC   sodium chloride flush  3 mL Intravenous Q12H   Continuous Infusions:     LOS: 3 days      Calvert Cantor, MD [facility] Pager: [email or web address] 10/23/2020, 7:41 AM

## 2020-10-23 NOTE — Progress Notes (Signed)
Cardiology Progress Note  Patient ID: Mary Mccarthy MRN: [id number] DOB: 06-13-1940 Date of Encounter: 10/23/2020  Primary Cardiologist: Bryan Lemma, MD  Subjective   Chief Complaint: None.  HPI: Bradycardia overnight.  Coreg held this morning.  Instructed to give amiodarone.  Denies chest pain or trouble breathing.  ROS:  All other ROS reviewed and negative. Pertinent positives noted in the HPI.     Inpatient Medications  Scheduled Meds:  amiodarone  200 mg Oral BID   atorvastatin  40 mg Oral Daily   fluconazole  200 mg Oral Daily   furosemide  40 mg Oral Daily   insulin aspart  0-5 Units Subcutaneous QHS   insulin aspart  0-9 Units Subcutaneous TID WC   nystatin cream   Topical BID   pantoprazole  40 mg Oral BID AC   potassium chloride  20 mEq Oral Daily   sodium chloride flush  3 mL Intravenous Q12H   Continuous Infusions:  PRN Meds: acetaminophen **OR** acetaminophen, albuterol, ALPRAZolam, ondansetron **OR** ondansetron (ZOFRAN) IV   Vital Signs   Vitals:   10/22/20 1929 10/23/20 0456 10/23/20 0500 10/23/20 0842  BP: (!) 173/48 (!) 161/57  (!) 157/50  Pulse: 60 60  (!) 55  Resp: 18 18  16   Temp: 98.3 F (36.8 C) 98.3 F (36.8 C)  97.9 F (36.6 C)  TempSrc: Oral Oral  Oral  SpO2: 100% 97%  97%  Weight:   89.8 kg   Height:        Intake/Output Summary (Last 24 hours) at 10/23/2020 0919 Last data filed at 10/23/2020 0500 Gross per 24 hour  Intake 569 ml  Output 1300 ml  Net -731 ml   Last 3 Weights 10/23/2020 10/22/2020 10/21/2020  Weight (lbs) 197 lb 15.6 oz 198 lb 6.4 oz 197 lb 8.5 oz  Weight (kg) 89.8 kg 89.994 kg 89.6 kg      Telemetry  Overnight telemetry shows sinus bradycardia in the 50s, which I personally reviewed.   Physical Exam   Vitals:   10/22/20 1929 10/23/20 0456 10/23/20 0500 10/23/20 0842  BP: (!) 173/48 (!) 161/57  (!) 157/50  Pulse: 60 60  (!) 55  Resp: 18 18  16   Temp: 98.3 F (36.8 C) 98.3 F (36.8 C)  97.9 F (36.6 C)   TempSrc: Oral Oral  Oral  SpO2: 100% 97%  97%  Weight:   89.8 kg   Height:        Intake/Output Summary (Last 24 hours) at 10/23/2020 0919 Last data filed at 10/23/2020 0500 Gross per 24 hour  Intake 569 ml  Output 1300 ml  Net -731 ml    Last 3 Weights 10/23/2020 10/22/2020 10/21/2020  Weight (lbs) 197 lb 15.6 oz 198 lb 6.4 oz 197 lb 8.5 oz  Weight (kg) 89.8 kg 89.994 kg 89.6 kg    Body mass index is 37.41 kg/m.   General: Well nourished, well developed, in no acute distress Head: Atraumatic, normal size  Eyes: PEERLA, EOMI  Neck: Supple, JVD 7 8 cm of water Endocrine: No thryomegaly Cardiac: Normal S1, S2; RRR; 3 out of 6 systolic ejection murmur, absent S2 Lungs: Crackles at the lung bases Abd: Soft, nontender, no hepatomegaly  Ext: No edema, pulses 2+ Musculoskeletal: No deformities, BUE and BLE strength normal and equal Skin: Warm and dry, no rashes   Neuro: Alert and oriented to person, place, time, and situation, CNII-XII grossly intact, no focal deficits  Psych: Normal mood and affect   Labs  High Sensitivity Troponin:   Recent Labs  Lab 09/27/20 0705 09/27/20 0935 10/19/20 0915 10/19/20 1326  TROPONINIHS 77* 74* 45* 45*     Cardiac EnzymesNo results for input(s): TROPONINI in the last 168 hours. No results for input(s): TROPIPOC in the last 168 hours.  Chemistry Recent Labs  Lab 10/21/20 1120 10/22/20 0229 10/23/20 0452  NA 139 139 138  K 4.0 4.0 3.7  CL 104 101 101  CO2 28 27 27   GLUCOSE 140* 126* 131*  BUN 11 17 17   CREATININE 0.98 1.01* 1.00  CALCIUM 9.2 9.4 9.5  GFRNONAA 58* 56* 56*  ANIONGAP 7 11 10     Hematology Recent Labs  Lab 10/21/20 0420 10/22/20 0752 10/23/20 0452  WBC 6.8 5.0 6.1  RBC 2.65* 2.38* 2.77*  HGB 8.3* 7.4* 8.6*  HCT 25.4* 23.2* 25.7*  MCV 95.8 97.5 92.8  MCH 31.3 31.1 31.0  MCHC 32.7 31.9 33.5  RDW 15.4 15.1 15.8*  PLT 188 191 198   BNP Recent Labs  Lab 10/18/20 1753  BNP 217.6*    DDimer No results for  input(s): DDIMER in the last 168 hours.   Radiology  No results found.  Cardiac Studies  LHC 09/29/2020   Prox RCA lesion is 20% stenosed.   Mid RCA to Dist RCA lesion is 20% stenosed.   Ramus lesion is 30% stenosed.   Mid LAD lesion is 20% stenosed.   Dist LM to Prox LAD lesion is 20% stenosed.   Mild non-obstructive CAD Severe aortic stenosis (mean gradient 52.8 mmHg, peak to peak gradient 58 mmHg, AVA 0.90 cm2).   TTE 08/10/2020  1. The aortic valve is calcified. Aortic valve regurgitation is not  visualized. Severe aortic valve stenosis. Aortic valve area, by VTI  measures 0.86 cm. Aortic valve mean gradient measures 42.0 mmHg. Aortic  valve Vmax measures 4.36 m/s.   2. Left ventricular ejection fraction, by estimation, is 55 to 60%. The  left ventricle has normal function. The left ventricle has no regional  wall motion abnormalities. There is severe concentric left ventricular  hypertrophy. Left ventricular diastolic   parameters are indeterminate.   3. Right ventricular systolic function is normal. The right ventricular  size is normal. There is moderately elevated pulmonary artery systolic  pressure.   4. Left atrial size was mildly dilated.   5. The pericardial effusion is circumferential.   6. The mitral valve is abnormal. Trivial mitral valve regurgitation. The  mean mitral valve gradient is 2.0 mmHg with average heart rate of 53 bpm.  Severe mitral annular calcification.   Patient Profile  Mary Mccarthy is a 80 y.o. female with obesity, diabetes, nonobstructive CAD, paroxysmal atrial fibrillation, stroke, diastolic heart failure, severe aortic stenosis who was admitted on 10/19/2020 with GI bleed secondary to gastritis.  Cardiology was consulted as she has severe aortic stenosis and TAVR is planned.  Course has been complicated by paroxysmal A. fib.  Assessment & Plan   #Paroxysmal A. Fib -Converted back to normal sinus rhythm.  Continue with oral amiodarone  load. -200 mg twice daily for 7 days.  She will start 200 mg daily on 9/22  -She received 1 unit of packed red blood cells yesterday.  We will hold on anticoagulation today.  As long as counts are stable tomorrow I anticipate putting her on a heparin drip.  She will be here until her TAVR is completed on Tuesday.  #Severe aortic stenosis -Plan for TAVR on Tuesday  #Chronic diastolic heart failure -  Gentle diuresis yesterday.  Elevated JVD. -We will start 40 mg of Lasix daily.  I have added potassium supplement.  #Acute blood loss anemia secondary to gastritis -Hemoglobin dropped yesterday.  Keep hemoglobin above 8. -We will hold on starting anticoagulation today.  As long as counts are stable tomorrow we will start on heparin drip.  For questions or updates, please contact [facility] Please consult [email or web address] for contact info under   Time Spent with Patient: I have spent a total of 35 minutes with patient reviewing hospital notes, telemetry, EKGs, labs and examining the patient as well as establishing an assessment and plan that was discussed with the patient.  > 50% of time was spent in direct patient care.    Signed, Lenna Gilford. Flora Lipps, MD, [facility]  [facility]  10/23/2020 9:19 AM

## 2020-10-24 DIAGNOSIS — D649 Anemia, unspecified: Secondary | ICD-10-CM | POA: Diagnosis not present

## 2020-10-24 LAB — CBC
HCT: 25.1 % — ABNORMAL LOW (ref 36.0–46.0)
Hemoglobin: 8.1 g/dL — ABNORMAL LOW (ref 12.0–15.0)
MCH: 30.8 pg (ref 26.0–34.0)
MCHC: 32.3 g/dL (ref 30.0–36.0)
MCV: 95.4 fL (ref 80.0–100.0)
Platelets: 194 10*3/uL (ref 150–400)
RBC: 2.63 MIL/uL — ABNORMAL LOW (ref 3.87–5.11)
RDW: 15.5 % (ref 11.5–15.5)
WBC: 5 10*3/uL (ref 4.0–10.5)
nRBC: 0 % (ref 0.0–0.2)

## 2020-10-24 LAB — BASIC METABOLIC PANEL
Anion gap: 9 (ref 5–15)
BUN: 17 mg/dL (ref 8–23)
CO2: 27 mmol/L (ref 22–32)
Calcium: 9.3 mg/dL (ref 8.9–10.3)
Chloride: 102 mmol/L (ref 98–111)
Creatinine, Ser: 1.1 mg/dL — ABNORMAL HIGH (ref 0.44–1.00)
GFR, Estimated: 50 mL/min — ABNORMAL LOW (ref >60)
Glucose, Bld: 132 mg/dL — ABNORMAL HIGH (ref 70–99)
Potassium: 3.8 mmol/L (ref 3.5–5.1)
Sodium: 138 mmol/L (ref 135–145)

## 2020-10-24 LAB — CULTURE, BLOOD (ROUTINE X 2)
Culture: NO GROWTH
Culture: NO GROWTH
Special Requests: ADEQUATE

## 2020-10-24 LAB — GLUCOSE, CAPILLARY
Glucose-Capillary: 148 mg/dL — ABNORMAL HIGH (ref 70–99)
Glucose-Capillary: 173 mg/dL — ABNORMAL HIGH (ref 70–99)
Glucose-Capillary: 150 mg/dL — ABNORMAL HIGH (ref 70–99)
Glucose-Capillary: 213 mg/dL — ABNORMAL HIGH (ref 70–99)

## 2020-10-24 LAB — MAGNESIUM: Magnesium: 2 mg/dL (ref 1.7–2.4)

## 2020-10-24 MED ORDER — FLUCONAZOLE 150 MG PO TABS
150.0000 mg | ORAL_TABLET | Freq: Every day | ORAL | Status: DC
  Administered 2020-10-24: 150 mg via ORAL
  Filled 2020-10-24 (×2): qty 1

## 2020-10-24 MED ORDER — SENNA 8.6 MG PO TABS
1.0000 | ORAL_TABLET | Freq: Once | ORAL | Status: AC
  Administered 2020-10-24: 8.6 mg via ORAL
  Filled 2020-10-24: qty 1

## 2020-10-24 MED ORDER — CLONIDINE HCL 0.1 MG PO TABS
0.1000 mg | ORAL_TABLET | Freq: Four times a day (QID) | ORAL | Status: DC | PRN
  Administered 2020-10-24: 0.1 mg via ORAL
  Filled 2020-10-24: qty 1

## 2020-10-24 MED ORDER — HYDRALAZINE HCL 25 MG PO TABS
25.0000 mg | ORAL_TABLET | Freq: Three times a day (TID) | ORAL | Status: DC | PRN
  Administered 2020-10-27: 25 mg via ORAL
  Filled 2020-10-24: qty 1

## 2020-10-24 MED ORDER — AMLODIPINE BESYLATE 5 MG PO TABS
5.0000 mg | ORAL_TABLET | Freq: Every day | ORAL | Status: DC
  Administered 2020-10-24: 5 mg via ORAL
  Filled 2020-10-24: qty 1

## 2020-10-24 NOTE — Progress Notes (Signed)
Cardiology Progress Note  Patient ID: Mary Mccarthy MRN: [id number] DOB: Jul 30, 1940 Date of Encounter: 10/24/2020  Primary Cardiologist: Bryan Lemma, MD  Subjective   Chief Complaint: None.  HPI: Denies symptoms of chest pain or trouble breathing.  BP elevated overnight given clonidine.  Remains bradycardic but without symptoms.  CBC pending.  ROS:  All other ROS reviewed and negative. Pertinent positives noted in the HPI.     Inpatient Medications  Scheduled Meds:  amiodarone  200 mg Oral BID   amLODipine  5 mg Oral Daily   atorvastatin  40 mg Oral Daily   fluconazole  200 mg Oral Daily   furosemide  40 mg Oral Daily   insulin aspart  0-5 Units Subcutaneous QHS   insulin aspart  0-9 Units Subcutaneous TID WC   nystatin cream   Topical BID   pantoprazole  40 mg Oral BID AC   potassium chloride  20 mEq Oral Daily   sodium chloride flush  3 mL Intravenous Q12H   Continuous Infusions:  PRN Meds: acetaminophen **OR** acetaminophen, albuterol, ALPRAZolam, hydrALAZINE, ondansetron **OR** ondansetron (ZOFRAN) IV   Vital Signs   Vitals:   10/23/20 2225 10/24/20 0011 10/24/20 0148 10/24/20 0321  BP: (!) 178/57 (!) 186/63 (!) 197/61 (!) 157/58  Pulse: (!) 58 (!) 59 (!) 56 (!) 53  Resp:    19  Temp:    97.7 F (36.5 C)  TempSrc:    Oral  SpO2: 97%  98% 96%  Weight:    88.8 kg  Height:        Intake/Output Summary (Last 24 hours) at 10/24/2020 0718 Last data filed at 10/24/2020 0865 Gross per 24 hour  Intake 840 ml  Output 1800 ml  Net -960 ml   Last 3 Weights 10/24/2020 10/23/2020 10/22/2020  Weight (lbs) 195 lb 11.2 oz 197 lb 15.6 oz 198 lb 6.4 oz  Weight (kg) 88.769 kg 89.8 kg 89.994 kg      Telemetry  Overnight telemetry shows sinus rhythm in the 70s, which I personally reviewed.   Physical Exam   Vitals:   10/23/20 2225 10/24/20 0011 10/24/20 0148 10/24/20 0321  BP: (!) 178/57 (!) 186/63 (!) 197/61 (!) 157/58  Pulse: (!) 58 (!) 59 (!) 56 (!) 53  Resp:     19  Temp:    97.7 F (36.5 C)  TempSrc:    Oral  SpO2: 97%  98% 96%  Weight:    88.8 kg  Height:        Intake/Output Summary (Last 24 hours) at 10/24/2020 0718 Last data filed at 10/24/2020 7846 Gross per 24 hour  Intake 840 ml  Output 1800 ml  Net -960 ml    Last 3 Weights 10/24/2020 10/23/2020 10/22/2020  Weight (lbs) 195 lb 11.2 oz 197 lb 15.6 oz 198 lb 6.4 oz  Weight (kg) 88.769 kg 89.8 kg 89.994 kg    Body mass index is 36.98 kg/m.  General: Well nourished, well developed, in no acute distress Head: Atraumatic, normal size  Eyes: PEERLA, EOMI  Neck: Supple, no JVD Endocrine: No thryomegaly Cardiac: Normal S1, S2; RRR; 3-6 harsh systolic ejection murmur, absent S2 Lungs: Diminished breath sounds bilaterally at the lung bases Abd: Soft, nontender, no hepatomegaly  Ext: No edema, pulses 2+ Musculoskeletal: No deformities, BUE and BLE strength normal and equal Skin: Warm and dry, no rashes   Neuro: Alert and oriented to person, place, time, and situation, CNII-XII grossly intact, no focal deficits  Psych: Normal mood  and affect   Labs  High Sensitivity Troponin:   Recent Labs  Lab 09/27/20 0705 09/27/20 0935 10/19/20 0915 10/19/20 1326  TROPONINIHS 77* 74* 45* 45*     Cardiac EnzymesNo results for input(s): TROPONINI in the last 168 hours. No results for input(s): TROPIPOC in the last 168 hours.  Chemistry Recent Labs  Lab 10/22/20 0229 10/23/20 0452 10/24/20 0224  NA 139 138 138  K 4.0 3.7 3.8  CL 101 101 102  CO2 27 27 27   GLUCOSE 126* 131* 132*  BUN 17 17 17   CREATININE 1.01* 1.00 1.10*  CALCIUM 9.4 9.5 9.3  GFRNONAA 56* 56* 50*  ANIONGAP 11 10 9     Hematology Recent Labs  Lab 10/21/20 0420 10/22/20 0752 10/23/20 0452  WBC 6.8 5.0 6.1  RBC 2.65* 2.38* 2.77*  HGB 8.3* 7.4* 8.6*  HCT 25.4* 23.2* 25.7*  MCV 95.8 97.5 92.8  MCH 31.3 31.1 31.0  MCHC 32.7 31.9 33.5  RDW 15.4 15.1 15.8*  PLT 188 191 198   BNP Recent Labs  Lab 10/18/20 1753   BNP 217.6*    DDimer No results for input(s): DDIMER in the last 168 hours.   Radiology  No results found.  Cardiac Studies  TTE 08/10/2020  1. The aortic valve is calcified. Aortic valve regurgitation is not  visualized. Severe aortic valve stenosis. Aortic valve area, by VTI  measures 0.86 cm. Aortic valve mean gradient measures 42.0 mmHg. Aortic  valve Vmax measures 4.36 m/s.   2. Left ventricular ejection fraction, by estimation, is 55 to 60%. The  left ventricle has normal function. The left ventricle has no regional  wall motion abnormalities. There is severe concentric left ventricular  hypertrophy. Left ventricular diastolic   parameters are indeterminate.   3. Right ventricular systolic function is normal. The right ventricular  size is normal. There is moderately elevated pulmonary artery systolic  pressure.   4. Left atrial size was mildly dilated.   5. The pericardial effusion is circumferential.   6. The mitral valve is abnormal. Trivial mitral valve regurgitation. The  mean mitral valve gradient is 2.0 mmHg with average heart rate of 53 bpm.  Severe mitral annular calcification.   Patient Profile  Mary Mccarthy is a 80 y.o. female with obesity, diabetes, nonobstructive CAD, paroxysmal atrial fibrillation, stroke, diastolic heart failure, severe aortic stenosis who was admitted on 10/19/2020 with GI bleed secondary to gastritis.  Cardiology was consulted as she has severe aortic stenosis and TAVR is planned.  Course has been complicated by paroxysmal A. fib.  Assessment & Plan   #Paroxysmal atrial fibrillation #Sinus bradycardia -Maintaining sinus rhythm. -Continue oral amiodarone load.  200 mg twice daily for 7 days.  She will start 200 mg daily on 10/28/2020. -Holding beta-blocker due to sinus bradycardia. -CBC is pending.  We will start a heparin drip if counts are normal.  #Severe aortic stenosis -TAVR on Tuesday.  #HFpEF -Appears to be stable.  40 mg of  Lasix daily.  #Hypertension -I have added Norvasc 5 mg daily. -Beta-blocker held. -Hydralazine as needed.  #Acute blood loss anemia secondary to gastritis -Hemoglobin has been stable. -Transfuse to keep hemoglobin above 8 -Holding anticoagulation.  If CBC is stable today we will start heparin drip for atrial fibrillation.  FEN -No intravenous fluids -Code: Full -DVT PPx: SCDs, heparin drip if CBC stable -Diet: Heart healthy  For questions or updates, please contact [facility] Please consult [email or web address] for contact info under   Time Spent with  Patient: I have spent a total of 35 minutes with patient reviewing hospital notes, telemetry, EKGs, labs and examining the patient as well as establishing an assessment and plan that was discussed with the patient.  > 50% of time was spent in direct patient care.    Signed, Lenna Gilford. Flora Lipps, MD, [facility]  [facility]  10/24/2020 7:18 AM

## 2020-10-24 NOTE — Consult Note (Signed)
WOC Nurse Consult Note: Reason for Consult: intertriginous dermatitis (irritant contact dermatitis to skin fold) Wound type: moisture/irritant contact dermatitis.  ICD-10 CM Codes for Irritant Dermatitis  L30.4  - Erythema intertrigo. Also used for abrasion of the hand, chafing of the skin, dermatitis due to sweating and friction, friction dermatitis, friction eczema, and genital/thigh intertrigo.   Pressure Injury POA: N/A Measurement: diffuse areas of ICD at the bilateral inguinal areas and intraabdominal skin folds Wound ZOX:[id number] pinpoint areas of partial thickness skin loss Drainage (amount, consistency, odor) scant serous Periwound:erythematous, confluent red center, dry peeling Dressing procedure/placement/frequency: I have provided guidance for the care of the affected areas using our hopuse antimicrobial wicking textile, InterDry. I additionally recommend several does of a system antifungal e.g. Diflucan.  If you agree and it is not contraindicated, please consider ordering.  WOC nursing team will not follow, but will remain available to this patient, the nursing and medical teams.  Please re-consult if needed. Thanks, Ladona Mow, MSN, RN, GNP, [facility]  Pager# [phone]

## 2020-10-24 NOTE — Progress Notes (Signed)
Cardiology has taken over as attending as of today and thus [facility] will sign off. Thank you for allowing [place] to participate in the care of this patient.

## 2020-10-25 ENCOUNTER — Encounter: Payer: Self-pay | Admitting: Internal Medicine

## 2020-10-25 ENCOUNTER — Inpatient Hospital Stay (HOSPITAL_COMMUNITY): Payer: Medicare Other

## 2020-10-25 DIAGNOSIS — D649 Anemia, unspecified: Secondary | ICD-10-CM | POA: Diagnosis not present

## 2020-10-25 LAB — GLUCOSE, CAPILLARY
Glucose-Capillary: 148 mg/dL — ABNORMAL HIGH (ref 70–99)
Glucose-Capillary: 201 mg/dL — ABNORMAL HIGH (ref 70–99)
Glucose-Capillary: 154 mg/dL — ABNORMAL HIGH (ref 70–99)
Glucose-Capillary: 188 mg/dL — ABNORMAL HIGH (ref 70–99)

## 2020-10-25 LAB — CBC
HCT: 28.2 % — ABNORMAL LOW (ref 36.0–46.0)
Hemoglobin: 9.1 g/dL — ABNORMAL LOW (ref 12.0–15.0)
MCH: 31 pg (ref 26.0–34.0)
MCHC: 32.3 g/dL (ref 30.0–36.0)
MCV: 95.9 fL (ref 80.0–100.0)
Platelets: 200 10*3/uL (ref 150–400)
RBC: 2.94 MIL/uL — ABNORMAL LOW (ref 3.87–5.11)
RDW: 15.4 % (ref 11.5–15.5)
WBC: 4.8 10*3/uL (ref 4.0–10.5)
nRBC: 0 % (ref 0.0–0.2)

## 2020-10-25 LAB — BASIC METABOLIC PANEL
Anion gap: 9 (ref 5–15)
BUN: 16 mg/dL (ref 8–23)
CO2: 27 mmol/L (ref 22–32)
Calcium: 9.9 mg/dL (ref 8.9–10.3)
Chloride: 100 mmol/L (ref 98–111)
Creatinine, Ser: 1.07 mg/dL — ABNORMAL HIGH (ref 0.44–1.00)
GFR, Estimated: 52 mL/min — ABNORMAL LOW (ref >60)
Glucose, Bld: 143 mg/dL — ABNORMAL HIGH (ref 70–99)
Potassium: 4.1 mmol/L (ref 3.5–5.1)
Sodium: 136 mmol/L (ref 135–145)

## 2020-10-25 LAB — PROTIME-INR
INR: 1.1 (ref 0.8–1.2)
INR: 1 (ref 0.8–1.2)
Prothrombin Time: 14 seconds (ref 11.4–15.2)
Prothrombin Time: 13.5 seconds (ref 11.4–15.2)

## 2020-10-25 LAB — HEPARIN LEVEL (UNFRACTIONATED): Heparin Unfractionated: 0.21 IU/mL — ABNORMAL LOW (ref 0.30–0.70)

## 2020-10-25 LAB — SURGICAL PCR SCREEN
MRSA, PCR: NEGATIVE
Staphylococcus aureus: NEGATIVE

## 2020-10-25 LAB — APTT: aPTT: 26 seconds (ref 24–36)

## 2020-10-25 IMAGING — CR DG CHEST 2V
2 series · 2 of 2 positions shown · non-contrast
Comparison: Chest x-ray [DATE].

CLINICAL DATA: Preop.

EXAM:
CHEST - 2 VIEW

[chest lat]
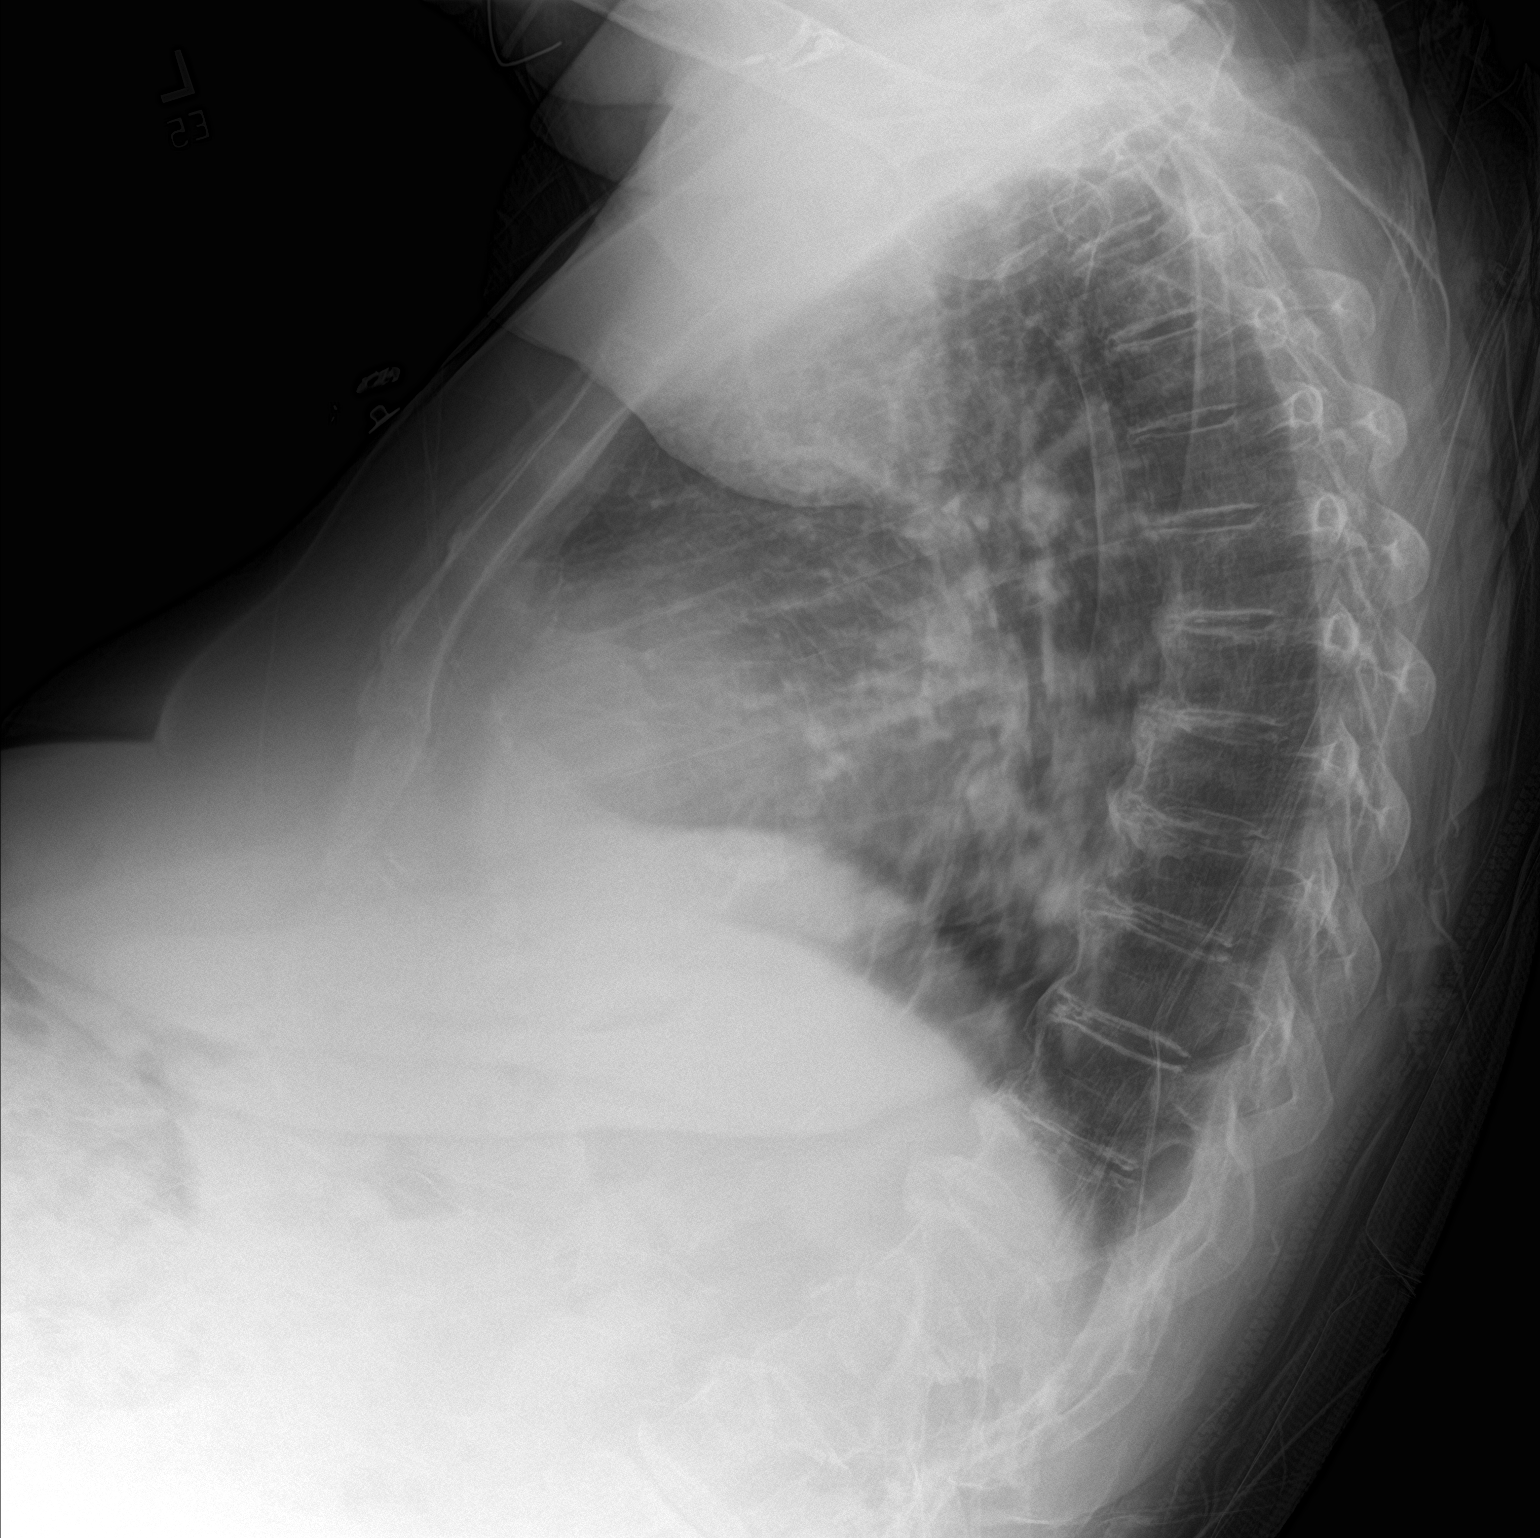

[chest ap]
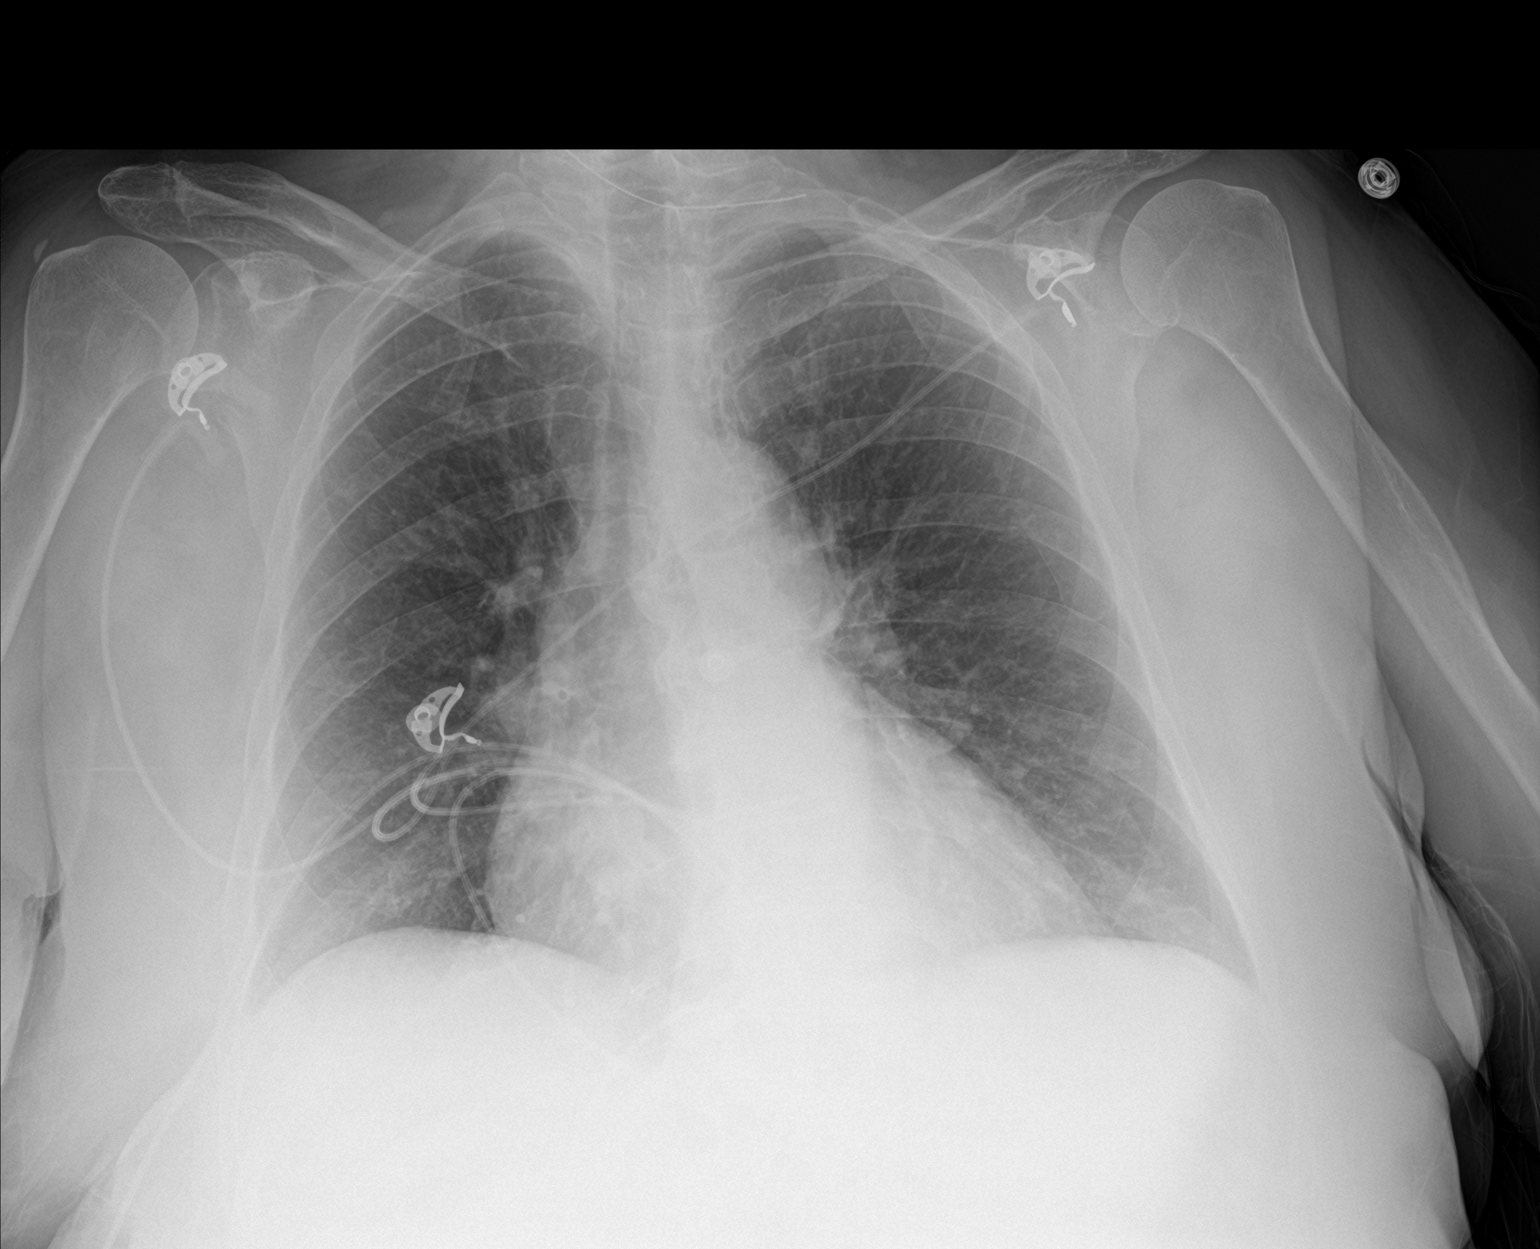

[2 of 2 positions shown; findings below may reference images not displayed]

FINDINGS: Examination is limited secondary to patient rotation. The heart size
and mediastinal contours are within normal limits. Both lungs are
clear. Degenerative changes affect the spine.
IMPRESSION: No active cardiopulmonary disease.

## 2020-10-25 MED ORDER — AMLODIPINE BESYLATE 10 MG PO TABS
10.0000 mg | ORAL_TABLET | Freq: Every day | ORAL | Status: DC
  Administered 2020-10-25 – 2020-10-28 (×3): 10 mg via ORAL
  Filled 2020-10-25 (×3): qty 1

## 2020-10-25 MED ORDER — POTASSIUM CHLORIDE 2 MEQ/ML IV SOLN
80.0000 meq | INTRAVENOUS | Status: DC
  Filled 2020-10-25: qty 40

## 2020-10-25 MED ORDER — DEXMEDETOMIDINE HCL IN NACL 400 MCG/100ML IV SOLN
0.1000 ug/kg/h | INTRAVENOUS | Status: AC
Start: 2020-10-26 — End: 2020-10-26
  Administered 2020-10-26: 88.4 ug via INTRAVENOUS
  Administered 2020-10-26: .4 ug/kg/h via INTRAVENOUS
  Filled 2020-10-25: qty 100

## 2020-10-25 MED ORDER — HEPARIN (PORCINE) 25000 UT/250ML-% IV SOLN
900.0000 [IU]/h | INTRAVENOUS | Status: DC
  Administered 2020-10-25: 800 [IU]/h via INTRAVENOUS
  Filled 2020-10-25: qty 250

## 2020-10-25 MED ORDER — MAGNESIUM SULFATE 50 % IJ SOLN
40.0000 meq | INTRAMUSCULAR | Status: DC
Start: 2020-10-26 — End: 2020-10-26
  Filled 2020-10-25: qty 9.85

## 2020-10-25 MED ORDER — CEFAZOLIN SODIUM-DEXTROSE 2-4 GM/100ML-% IV SOLN
2.0000 g | INTRAVENOUS | Status: AC
  Administered 2020-10-26: 2 g via INTRAVENOUS
  Filled 2020-10-25: qty 100

## 2020-10-25 MED ORDER — HYDRALAZINE HCL 50 MG PO TABS
50.0000 mg | ORAL_TABLET | Freq: Three times a day (TID) | ORAL | Status: DC
  Administered 2020-10-25 – 2020-10-27 (×4): 50 mg via ORAL
  Filled 2020-10-25 (×4): qty 1

## 2020-10-25 MED ORDER — TEMAZEPAM 15 MG PO CAPS
15.0000 mg | ORAL_CAPSULE | Freq: Once | ORAL | Status: DC | PRN
Start: 2020-10-25 — End: 2020-10-26

## 2020-10-25 MED ORDER — CHLORHEXIDINE GLUCONATE 0.12 % MT SOLN: 15.0000 mL | Freq: Once | OROMUCOSAL | Status: DC

## 2020-10-25 MED ORDER — SODIUM CHLORIDE 0.9 % IV SOLN
INTRAVENOUS | Status: DC
  Filled 2020-10-25: qty 30

## 2020-10-25 MED ORDER — CHLORHEXIDINE GLUCONATE 4 % EX LIQD
1.0000 "application " | Freq: Once | CUTANEOUS | Status: AC
  Administered 2020-10-25: 1 via TOPICAL
  Filled 2020-10-25: qty 15

## 2020-10-25 MED ORDER — NOREPINEPHRINE 4 MG/250ML-% IV SOLN
0.0000 ug/min | INTRAVENOUS | Status: AC
  Administered 2020-10-26: 5 ug/min via INTRAVENOUS
  Filled 2020-10-25: qty 250

## 2020-10-25 MED ORDER — BISACODYL 5 MG PO TBEC
5.0000 mg | DELAYED_RELEASE_TABLET | Freq: Once | ORAL | Status: AC
  Administered 2020-10-25: 5 mg via ORAL
  Filled 2020-10-25: qty 1

## 2020-10-25 NOTE — Progress Notes (Signed)
Cardiology Progress Note  Patient ID: Mary Mccarthy MRN: [id number] DOB: 09/19/1940 Date of Encounter: 10/25/2020  Primary Cardiologist: Bryan Lemma, MD  Subjective   Chief Complaint: None.    HPI: Hemoglobin stable.  Heparin drip ordered.  ROS:  All other ROS reviewed and negative. Pertinent positives noted in the HPI.     Inpatient Medications  Scheduled Meds:  amiodarone  200 mg Oral BID   amLODipine  10 mg Oral Daily   atorvastatin  40 mg Oral Daily   fluconazole  200 mg Oral Daily   furosemide  40 mg Oral Daily   hydrALAZINE  50 mg Oral Q8H   insulin aspart  0-5 Units Subcutaneous QHS   insulin aspart  0-9 Units Subcutaneous TID WC   [START ON 10/26/2020] magnesium sulfate  40 mEq Other To OR   nystatin cream   Topical BID   pantoprazole  40 mg Oral BID AC   [START ON 10/26/2020] potassium chloride  80 mEq Other To OR   potassium chloride  20 mEq Oral Daily   sodium chloride flush  3 mL Intravenous Q12H   Continuous Infusions:  [START ON 10/26/2020]  ceFAZolin (ANCEF) IV     [START ON 10/26/2020] dexmedetomidine     [START ON 10/26/2020] heparin 30,000 units/NS 1000 mL solution for CELLSAVER     heparin     [START ON 10/26/2020] norepinephrine (LEVOPHED) Adult infusion     PRN Meds: acetaminophen **OR** acetaminophen, albuterol, ALPRAZolam, hydrALAZINE, ondansetron **OR** ondansetron (ZOFRAN) IV   Vital Signs   Vitals:   10/24/20 1942 10/25/20 0422 10/25/20 0731 10/25/20 1000  BP: (!) 167/44 (!) 181/62 (!) 185/51   Pulse: (!) 54 (!) 51 (!) 56   Resp: 19 18    Temp: 97.9 F (36.6 C) 97.6 F (36.4 C) 97.9 F (36.6 C)   TempSrc: Oral Oral Oral   SpO2: 100% 99% 100%   Weight:  88.4 kg  88.4 kg  Height:    5\' 1"  (1.549 m)    Intake/Output Summary (Last 24 hours) at 10/25/2020 1048 Last data filed at 10/25/2020 0837 Gross per 24 hour  Intake 837 ml  Output 1750 ml  Net -913 ml   Last 3 Weights 10/25/2020 10/25/2020 10/24/2020  Weight (lbs) 194 lb 14.2 oz 194  lb 14.2 oz 195 lb 11.2 oz  Weight (kg) 88.4 kg 88.4 kg 88.769 kg      Telemetry  Overnight telemetry shows sinus bradycardia rate in the 50s, which I personally reviewed.   Physical Exam   Vitals:   10/24/20 1942 10/25/20 0422 10/25/20 0731 10/25/20 1000  BP: (!) 167/44 (!) 181/62 (!) 185/51   Pulse: (!) 54 (!) 51 (!) 56   Resp: 19 18    Temp: 97.9 F (36.6 C) 97.6 F (36.4 C) 97.9 F (36.6 C)   TempSrc: Oral Oral Oral   SpO2: 100% 99% 100%   Weight:  88.4 kg  88.4 kg  Height:    5\' 1"  (1.549 m)    Intake/Output Summary (Last 24 hours) at 10/25/2020 1048 Last data filed at 10/25/2020 0837 Gross per 24 hour  Intake 837 ml  Output 1750 ml  Net -913 ml    Last 3 Weights 10/25/2020 10/25/2020 10/24/2020  Weight (lbs) 194 lb 14.2 oz 194 lb 14.2 oz 195 lb 11.2 oz  Weight (kg) 88.4 kg 88.4 kg 88.769 kg    Body mass index is 36.82 kg/m.   General: Well nourished, well developed, in no acute  distress Head: Atraumatic, normal size  Eyes: PEERLA, EOMI  Neck: Supple, no JVD Endocrine: No thryomegaly Cardiac: Normal S1, S2; regular rhythm, 3/6 systolic ejection murmur, absent S2 Lungs: Clear to auscultation bilaterally, no wheezing, rhonchi or rales  Abd: Soft, nontender, no hepatomegaly  Ext: No edema, pulses 2+ Musculoskeletal: No deformities, BUE and BLE strength normal and equal Skin: Warm and dry, no rashes   Neuro: Alert and oriented to person, place, time, and situation, CNII-XII grossly intact, no focal deficits  Psych: Normal mood and affect   Labs  High Sensitivity Troponin:   Recent Labs  Lab 09/27/20 0705 09/27/20 0935 10/19/20 0915 10/19/20 1326  TROPONINIHS 77* 74* 45* 45*     Cardiac EnzymesNo results for input(s): TROPONINI in the last 168 hours. No results for input(s): TROPIPOC in the last 168 hours.  Chemistry Recent Labs  Lab 10/23/20 0452 10/24/20 0224 10/25/20 0447  NA 138 138 136  K 3.7 3.8 4.1  CL 101 102 100  CO2 27 27 27   GLUCOSE 131*  132* 143*  BUN 17 17 16   CREATININE 1.00 1.10* 1.07*  CALCIUM 9.5 9.3 9.9  GFRNONAA 56* 50* 52*  ANIONGAP 10 9 9     Hematology Recent Labs  Lab 10/23/20 0452 10/24/20 0225 10/25/20 0447  WBC 6.1 5.0 4.8  RBC 2.77* 2.63* 2.94*  HGB 8.6* 8.1* 9.1*  HCT 25.7* 25.1* 28.2*  MCV 92.8 95.4 95.9  MCH 31.0 30.8 31.0  MCHC 33.5 32.3 32.3  RDW 15.8* 15.5 15.4  PLT 198 194 200   BNP Recent Labs  Lab 10/18/20 1753  BNP 217.6*    DDimer No results for input(s): DDIMER in the last 168 hours.   Radiology  No results found.  Cardiac Studies  TTE 08/10/2020  1. The aortic valve is calcified. Aortic valve regurgitation is not  visualized. Severe aortic valve stenosis. Aortic valve area, by VTI  measures 0.86 cm. Aortic valve mean gradient measures 42.0 mmHg. Aortic  valve Vmax measures 4.36 m/s.   2. Left ventricular ejection fraction, by estimation, is 55 to 60%. The  left ventricle has normal function. The left ventricle has no regional  wall motion abnormalities. There is severe concentric left ventricular  hypertrophy. Left ventricular diastolic   parameters are indeterminate.   3. Right ventricular systolic function is normal. The right ventricular  size is normal. There is moderately elevated pulmonary artery systolic  pressure.   4. Left atrial size was mildly dilated.   5. The pericardial effusion is circumferential.   6. The mitral valve is abnormal. Trivial mitral valve regurgitation. The  mean mitral valve gradient is 2.0 mmHg with average heart rate of 53 bpm.  Severe mitral annular calcification.    Patient Profile  Mary Mccarthy is a 80 y.o. female with obesity, diabetes, nonobstructive CAD, paroxysmal atrial fibrillation, stroke, diastolic heart failure, severe aortic stenosis who was admitted on 10/19/2020 with GI bleed secondary to gastritis.  Cardiology was consulted as she has severe aortic stenosis and TAVR is planned.  Course has been complicated by paroxysmal  A. fib.  Assessment & Plan   #Paroxysmal atrial fibrillation #Sinus bradycardia -Maintaining sinus rhythm. -Beta-blocker stopped due to bradycardia. -Continue amiodarone 200 mg twice daily.  Transitioning to 100 mg daily on 10/28/2020. -Hemoglobin stable.  Start heparin drip.  Plan for TAVR tomorrow.  #Severe aortic stenosis -Plan for TAVR tomorrow.  N.p.o. at midnight.  #HFpEF -Appears euvolemic.  Continue with 40 mg of p.o. Lasix daily.  #Hypertension -  Norvasc 5 mg daily added. -We will add scheduled hydralazine 50 mg 3 times daily.  #Acute blood loss anemia secondary to gastritis -Hemoglobin stable. -Add heparin drip. -No further bleeding.  FEN -No intravenous fluids -Code: Full -DVT PPx: Heparin drip -Diet: Heart healthy -N.p.o. for TAVR at midnight  For questions or updates, please contact [facility] Please consult [email or web address] for contact info under   Time Spent with Patient: I have spent a total of 35 minutes with patient reviewing hospital notes, telemetry, EKGs, labs and examining the patient as well as establishing an assessment and plan that was discussed with the patient.  > 50% of time was spent in direct patient care.    Signed, Lenna Gilford. Flora Lipps, MD, [facility]  [facility]  10/25/2020 10:48 AM

## 2020-10-25 NOTE — Progress Notes (Signed)
Interdry applied to folds affected by intertriginous dermatitis. Area cleaned and dried prior to application of interdry.

## 2020-10-25 NOTE — Care Management Important Message (Signed)
Important Message  Patient Details  Name: Mary Mccarthy MRN: [id number] Date of Birth: 08-30-1940   Medicare Important Message Given:  Yes     Cortina Vultaggio Stefan Church 10/25/2020, 4:41 PM

## 2020-10-25 NOTE — Progress Notes (Signed)
ANTICOAGULATION CONSULT NOTE - Initial Consult  Pharmacy Consult for heparin Indication: atrial fibrillation  Allergies  Allergen Reactions   Ibuprofen Other (See Comments)    Bleeding events    Patient Measurements: Height: 5\' 1"  (154.9 cm) Weight: 88.4 kg (194 lb 14.2 oz) IBW/kg (Calculated) : 47.8 Heparin Dosing Weight: 68.3 kg   Vital Signs: Temp: 98.3 F (36.8 C) (09/19 1148) Temp Source: Oral (09/19 1148) BP: 141/48 (09/19 1148) Pulse Rate: 52 (09/19 1148)  Labs: Recent Labs    10/23/20 0452 10/24/20 0224 10/24/20 0225 10/25/20 0447 10/25/20 1111 10/25/20 1812  HGB 8.6*  --  8.1* 9.1*  --   --   HCT 25.7*  --  25.1* 28.2*  --   --   PLT 198  --  194 200  --   --   APTT  --   --   --   --  26  --   LABPROT  --   --   --   --  14.0  --   INR  --   --   --   --  1.1  --   HEPARINUNFRC  --   --   --   --   --  0.21*  CREATININE 1.00 1.10*  --  1.07*  --   --      Estimated Creatinine Clearance: 41 mL/min (A) (by C-G formula based on SCr of 1.07 mg/dL (H)).   Medical History: Past Medical History:  Diagnosis Date   ALLERGIC RHINITIS 06/24/2007   Anemia    AVM (arteriovenous malformation) of colon    Blood transfusion without reported diagnosis 05/2015   COLONIC POLYPS, HX OF 06/24/2007   Coronary artery disease, non-occlusive 05/2015   Trop + w/ Acute Anemia =>CATH: small RI - Ostial 60%, ostial RCA 30% and dLAD 40-50%;; 9/'21: Cor Ca Score 624. Mild (25-49%) prox RCA & LAD,; Moderate (50-69%) Ostial Small RI & prox LCx.     DEPRESSION 10/08/2006   DIABETES MELLITUS, TYPE II 10/05/2006   GERD 06/24/2007   History of CVA (cerebrovascular accident)    08/2020- found to be in afib with RVR, started on Eliquis   HYPERLIPIDEMIA 10/08/2006   HYPERTENSION 10/08/2006   INSOMNIA 09/24/2008   Left knee DJD    Osteoporosis 03/10/2016   PAF (paroxysmal atrial fibrillation) (HCC)    PEPTIC ULCER DISEASE 10/08/2006   Severe aortic stenosis     Medications:   see MAR  Assessment: 80 yo F with PMHx of Afib (PTA eliquis, last dose 9/12), held so far 2/2 GIB. Going for TAVR tomorrow, 9/20. Per cardiology and GI, ok to resume AC. Heparin consult for Afib.   9/19 @1812  Heparin level- 0.21  (on 800 units/hr) H/H stable. Plan for TAVR 9/20  Goal of Therapy:  Heparin level 0.3-0.7 units/ml Monitor platelets by anticoagulation protocol: Yes   Plan:  Increase heparin to 900 units/hr (cautious 2/2 recent GI Bleed) Obtain heparin level with AM labs Daily heparin level and CBC ordered   , PharmD Clinical Pharmacist  Please check AMION for all [facility] Pharmacy numbers After 10:00 PM, call Main Pharmacy [phone]

## 2020-10-25 NOTE — Progress Notes (Signed)
ANTICOAGULATION CONSULT NOTE - Initial Consult  Pharmacy Consult for heparin Indication: atrial fibrillation  Allergies  Allergen Reactions   Ibuprofen Other (See Comments)    Bleeding events    Patient Measurements: Height: 5\' 1"  (154.9 cm) Weight: 88.4 kg (194 lb 14.2 oz) IBW/kg (Calculated) : 47.8 Heparin Dosing Weight: 68.3 kg   Vital Signs: Temp: 97.9 F (36.6 C) (09/19 0731) Temp Source: Oral (09/19 0731) BP: 185/51 (09/19 0731) Pulse Rate: 56 (09/19 0731)  Labs: Recent Labs    10/23/20 0452 10/24/20 0224 10/24/20 0225 10/25/20 0447  HGB 8.6*  --  8.1* 9.1*  HCT 25.7*  --  25.1* 28.2*  PLT 198  --  194 200  CREATININE 1.00 1.10*  --  1.07*    Estimated Creatinine Clearance: 41 mL/min (A) (by C-G formula based on SCr of 1.07 mg/dL (H)).   Medical History: Past Medical History:  Diagnosis Date   ALLERGIC RHINITIS 06/24/2007   Anemia    AVM (arteriovenous malformation) of colon    Blood transfusion without reported diagnosis 05/2015   COLONIC POLYPS, HX OF 06/24/2007   Coronary artery disease, non-occlusive 05/2015   Trop + w/ Acute Anemia =>CATH: small RI - Ostial 60%, ostial RCA 30% and dLAD 40-50%;; 9/'21: Cor Ca Score 624. Mild (25-49%) prox RCA & LAD,; Moderate (50-69%) Ostial Small RI & prox LCx.     DEPRESSION 10/08/2006   DIABETES MELLITUS, TYPE II 10/05/2006   GERD 06/24/2007   History of CVA (cerebrovascular accident)    08/2020- found to be in afib with RVR, started on Eliquis   HYPERLIPIDEMIA 10/08/2006   HYPERTENSION 10/08/2006   INSOMNIA 09/24/2008   Left knee DJD    Osteoporosis 03/10/2016   PAF (paroxysmal atrial fibrillation) (HCC)    PEPTIC ULCER DISEASE 10/08/2006   Severe aortic stenosis     Medications:  see MAR  Assessment: 80 yo F with PMHx of Afib (PTA eliquis, last dose 9/12), held so far 2/2 GIB. Going for TAVR tomorrow, 9/20. Per cardiology and GI, ok to resume AC. Heparin consult for Afib.   CBC stable - Hgb trending  up, up to 9.1 today. PLTc 200s.  Goal of Therapy:  Heparin level 0.3-0.7 units/ml Monitor platelets by anticoagulation protocol: Yes   Plan:  Start heparin infusion at 800 units/hr Check anti-Xa level in 8 hours and daily while on heparin Continue to monitor H&H and platelets  10/20, PharmD, [facility] Emergency Medicine Clinical Pharmacist ED RPh Phone: [phone] Main RX: [phone]

## 2020-10-25 NOTE — Anesthesia Preprocedure Evaluation (Addendum)
Anesthesia Evaluation  Patient identified by MRN, date of birth, ID band Patient awake    Reviewed: Allergy & Precautions, NPO status , Patient's Chart, lab work & pertinent test results  Airway Mallampati: II  TM Distance: >3 FB     Dental   Pulmonary neg pulmonary ROS,    breath sounds clear to auscultation       Cardiovascular hypertension, + CAD and +CHF  negative cardio ROS   Rhythm:Regular Rate:Normal + Systolic murmurs    Neuro/Psych Anxiety Depression    GI/Hepatic Neg liver ROS, PUD, GERD  ,  Endo/Other  diabetes  Renal/GU negative Renal ROS     Musculoskeletal   Abdominal   Peds  Hematology   Anesthesia Other Findings   Reproductive/Obstetrics                           Anesthesia Physical Anesthesia Plan  ASA: 3  Anesthesia Plan: MAC   Post-op Pain Management:    Induction:   PONV Risk Score and Plan: 2 and Ondansetron  Airway Management Planned: Nasal Cannula and Simple Face Mask  Additional Equipment:   Intra-op Plan:   Post-operative Plan:   Informed Consent: I have reviewed the patients History and Physical, chart, labs and discussed the procedure including the risks, benefits and alternatives for the proposed anesthesia with the patient or authorized representative who has indicated his/her understanding and acceptance.     Dental advisory given  Plan Discussed with: Anesthesiologist and CRNA  Anesthesia Plan Comments:        Anesthesia Quick Evaluation

## 2020-10-25 NOTE — Progress Notes (Signed)
CARDIAC REHAB PHASE I   PRE:  Rate/Rhythm: 55 SB  BP:  Supine:   Sitting: 181/53 right arm  Standing:    SaO2: 97%RA  MODE:  Ambulation: 200 ft   POST:  Rate/Rhythm: 66  BP:  Supine:   Sitting: 194/46 right, 169/56 left arm  Standing:    SaO2: 98%RA 1008-1038 Pt assisted to bathroom and then walked 200 ft on RA with handheld asst. Stopped once to rest. Motivated to walk as she had walked earlier/ to recliner with call bell. Will see after TAVR.   Luetta Nutting, RN BSN  10/25/2020 10:34 AM

## 2020-10-25 NOTE — Progress Notes (Signed)
  Mobility Specialist Criteria Algorithm Info.  Mobility Team: [facility] elevated:Self regulated Activity: Ambulated in hall; Dangled on edge of bed Range of motion: Active; All extremities Level of assistance: Contact guard assist, steadying assist Assistive device: Other (Comment) (Used railing in hallway + furniture) Minutes sitting in chair:  Minutes stood:  Minutes ambulated: 5 minutes Distance ambulated (ft): 500 ft Mobility response: Tolerated well Bed Position: Semi-fowlers  Patient eager to participate in mobility this morning. Ambulated in hallway with slow steady gait at min guard, using railing in hallway and furniture for stability. Had LOB x 1 requiring min A to gather balance when returned to room. Was left dangling EOB with all needs met and RN present.  10/25/2020 10:15 AM

## 2020-10-25 NOTE — Plan of Care (Signed)
  Problem: Education: Goal: Knowledge of General Education information will improve Description: Including pain rating scale, medication(s)/side effects and non-pharmacologic comfort measures Outcome: Progressing   Problem: Health Behavior/Discharge Planning: Goal: Ability to manage health-related needs will improve Outcome: Progressing   Problem: Clinical Measurements: Goal: Ability to maintain clinical measurements within normal limits will improve Outcome: Progressing Goal: Will remain free from infection Outcome: Progressing Goal: Diagnostic test results will improve Outcome: Progressing Goal: Cardiovascular complication will be avoided Outcome: Progressing   Problem: Activity: Goal: Risk for activity intolerance will decrease Outcome: Progressing   Problem: Elimination: Goal: Will not experience complications related to bowel motility Outcome: Progressing   Problem: Safety: Goal: Ability to remain free from injury will improve Outcome: Progressing   Problem: Skin Integrity: Goal: Risk for impaired skin integrity will decrease Outcome: Progressing   Problem: Education: Goal: Ability to demonstrate management of disease process will improve Outcome: Progressing Goal: Ability to verbalize understanding of medication therapies will improve Outcome: Progressing Goal: Individualized Educational Video(s) Outcome: Progressing   Problem: Activity: Goal: Capacity to carry out activities will improve Outcome: Progressing   Problem: Cardiac: Goal: Ability to achieve and maintain adequate cardiopulmonary perfusion will improve Outcome: Progressing

## 2020-10-26 ENCOUNTER — Encounter (HOSPITAL_COMMUNITY)
Admission: EM | Disposition: A | Discharge status: Home / Self Care | Payer: Medicare Other | Source: Home / Self Care | Attending: Cardiovascular Disease

## 2020-10-26 ENCOUNTER — Inpatient Hospital Stay (HOSPITAL_COMMUNITY): Payer: Medicare Other

## 2020-10-26 ENCOUNTER — Other Ambulatory Visit: Payer: Self-pay | Admitting: Physician Assistant

## 2020-10-26 ENCOUNTER — Encounter: Payer: Self-pay | Admitting: Physician Assistant

## 2020-10-26 ENCOUNTER — Encounter (HOSPITAL_COMMUNITY): Payer: Self-pay | Admitting: Internal Medicine

## 2020-10-26 ENCOUNTER — Inpatient Hospital Stay (HOSPITAL_COMMUNITY): Payer: Medicare Other | Admitting: Certified Registered"

## 2020-10-26 DIAGNOSIS — I350 Nonrheumatic aortic (valve) stenosis: Secondary | ICD-10-CM

## 2020-10-26 DIAGNOSIS — D62 Acute posthemorrhagic anemia: Secondary | ICD-10-CM | POA: Diagnosis not present

## 2020-10-26 DIAGNOSIS — Z952 Presence of prosthetic heart valve: Secondary | ICD-10-CM

## 2020-10-26 DIAGNOSIS — Z20822 Contact with and (suspected) exposure to covid-19: Secondary | ICD-10-CM | POA: Diagnosis not present

## 2020-10-26 DIAGNOSIS — K254 Chronic or unspecified gastric ulcer with hemorrhage: Secondary | ICD-10-CM | POA: Diagnosis not present

## 2020-10-26 DIAGNOSIS — Z954 Presence of other heart-valve replacement: Secondary | ICD-10-CM

## 2020-10-26 DIAGNOSIS — Z006 Encounter for examination for normal comparison and control in clinical research program: Secondary | ICD-10-CM

## 2020-10-26 HISTORY — PX: TRANSCATHETER AORTIC VALVE REPLACEMENT, TRANSFEMORAL: SHX6400

## 2020-10-26 HISTORY — DX: Presence of prosthetic heart valve: Z95.2

## 2020-10-26 HISTORY — PX: INTRAOPERATIVE TRANSTHORACIC ECHOCARDIOGRAM: SHX6523

## 2020-10-26 LAB — BASIC METABOLIC PANEL
Anion gap: 9 (ref 5–15)
BUN: 23 mg/dL (ref 8–23)
CO2: 28 mmol/L (ref 22–32)
Calcium: 10.1 mg/dL (ref 8.9–10.3)
Chloride: 99 mmol/L (ref 98–111)
Creatinine, Ser: 1.27 mg/dL — ABNORMAL HIGH (ref 0.44–1.00)
GFR, Estimated: 42 mL/min — ABNORMAL LOW (ref >60)
Glucose, Bld: 164 mg/dL — ABNORMAL HIGH (ref 70–99)
Potassium: 4.5 mmol/L (ref 3.5–5.1)
Sodium: 136 mmol/L (ref 135–145)

## 2020-10-26 LAB — BLOOD GAS, ARTERIAL
Acid-Base Excess: 3.7 mmol/L — ABNORMAL HIGH (ref 0.0–2.0)
Bicarbonate: 27.6 mmol/L (ref 20.0–28.0)
Drawn by: 56037
FIO2: 21
O2 Saturation: 96.8 %
Patient temperature: 37
pCO2 arterial: 40.6 mmHg (ref 32.0–48.0)
pH, Arterial: 7.446 (ref 7.350–7.450)
pO2, Arterial: 84.1 mmHg (ref 83.0–108.0)

## 2020-10-26 LAB — URINALYSIS, ROUTINE W REFLEX MICROSCOPIC
Bilirubin Urine: NEGATIVE
Glucose, UA: NEGATIVE mg/dL
Hgb urine dipstick: NEGATIVE
Ketones, ur: NEGATIVE mg/dL
Leukocytes,Ua: NEGATIVE
Nitrite: NEGATIVE
Protein, ur: NEGATIVE mg/dL
Specific Gravity, Urine: 1.01 (ref 1.005–1.030)
pH: 6.5 (ref 5.0–8.0)

## 2020-10-26 LAB — HEPARIN LEVEL (UNFRACTIONATED): Heparin Unfractionated: 0.4 IU/mL (ref 0.30–0.70)

## 2020-10-26 LAB — CBC
HCT: 28.6 % — ABNORMAL LOW (ref 36.0–46.0)
Hemoglobin: 9.3 g/dL — ABNORMAL LOW (ref 12.0–15.0)
MCH: 31.2 pg (ref 26.0–34.0)
MCHC: 32.5 g/dL (ref 30.0–36.0)
MCV: 96 fL (ref 80.0–100.0)
Platelets: 213 10*3/uL (ref 150–400)
RBC: 2.98 MIL/uL — ABNORMAL LOW (ref 3.87–5.11)
RDW: 15.4 % (ref 11.5–15.5)
WBC: 5.9 10*3/uL (ref 4.0–10.5)
nRBC: 0 % (ref 0.0–0.2)

## 2020-10-26 LAB — PREPARE RBC (CROSSMATCH)

## 2020-10-26 LAB — POCT I-STAT, CHEM 8
BUN: 20 mg/dL (ref 8–23)
Calcium, Ion: 1.35 mmol/L (ref 1.15–1.40)
Chloride: 100 mmol/L (ref 98–111)
Creatinine, Ser: 1 mg/dL (ref 0.44–1.00)
Glucose, Bld: 167 mg/dL — ABNORMAL HIGH (ref 70–99)
HCT: 26 % — ABNORMAL LOW (ref 36.0–46.0)
Hemoglobin: 8.8 g/dL — ABNORMAL LOW (ref 12.0–15.0)
Potassium: 4.2 mmol/L (ref 3.5–5.1)
Sodium: 138 mmol/L (ref 135–145)
TCO2: 28 mmol/L (ref 22–32)

## 2020-10-26 LAB — GLUCOSE, CAPILLARY
Glucose-Capillary: 145 mg/dL — ABNORMAL HIGH (ref 70–99)
Glucose-Capillary: 202 mg/dL — ABNORMAL HIGH (ref 70–99)
Glucose-Capillary: 143 mg/dL — ABNORMAL HIGH (ref 70–99)
Glucose-Capillary: 141 mg/dL — ABNORMAL HIGH (ref 70–99)

## 2020-10-26 LAB — ECHOCARDIOGRAM LIMITED
AR max vel: 1.37 cm2
AV Area VTI: 1.66 cm2
AV Area mean vel: 1.37 cm2
AV Mean grad: 12 mmHg
AV Peak grad: 20.2 mmHg
Ao pk vel: 2.25 m/s
Height: 61 in
Weight: 3089.97 oz

## 2020-10-26 IMAGING — DX DG CHEST 1V PORT
1 series · 1 of 1 positions shown · non-contrast
Comparison: [DATE]

CLINICAL DATA: Status post TAVR

EXAM:
PORTABLE CHEST 1 VIEW

[chest ap]
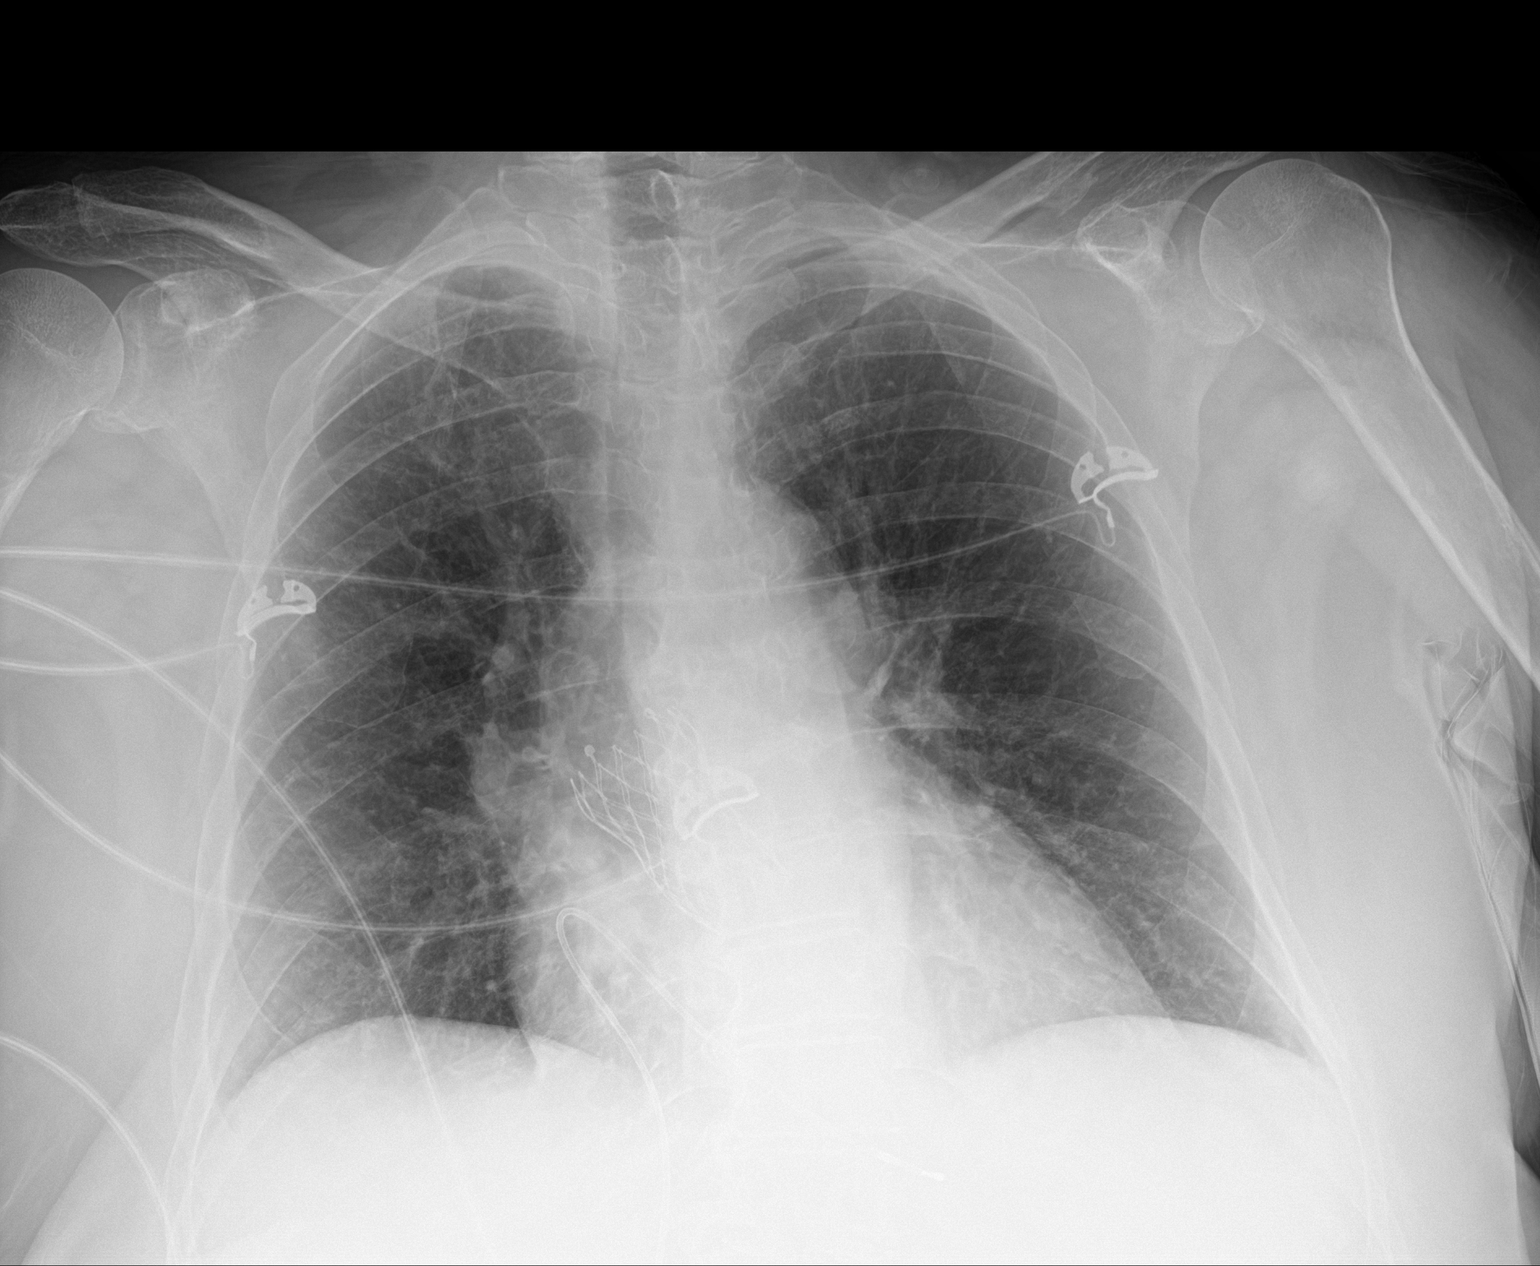

[1 of 1 positions shown; findings below may reference images not displayed]

FINDINGS: Mild cardiomegaly. Interval placement of aortic valve stent
endograft. Both lungs are clear. The visualized skeletal structures
are unremarkable.
IMPRESSION: Mild cardiomegaly. Interval placement of aortic valve stent
endograft. No acute abnormality of the lungs.

## 2020-10-26 SURGERY — IMPLANTATION, AORTIC VALVE, TRANSCATHETER, FEMORAL APPROACH
Anesthesia: Monitor Anesthesia Care | Site: Chest

## 2020-10-26 MED ORDER — ACETAMINOPHEN 325 MG PO TABS
650.0000 mg | ORAL_TABLET | Freq: Four times a day (QID) | ORAL | Status: DC | PRN
  Administered 2020-10-26 – 2020-10-27 (×3): 650 mg via ORAL
  Filled 2020-10-26 (×3): qty 2

## 2020-10-26 MED ORDER — HEPARIN 6000 UNIT IRRIGATION SOLUTION
Status: AC
  Filled 2020-10-26: qty 1500

## 2020-10-26 MED ORDER — CHLORHEXIDINE GLUCONATE CLOTH 2 % EX PADS
6.0000 | MEDICATED_PAD | Freq: Every day | CUTANEOUS | Status: DC
  Administered 2020-10-26 – 2020-10-28 (×3): 6 via TOPICAL

## 2020-10-26 MED ORDER — DEXMEDETOMIDINE (PRECEDEX) IN NS 20 MCG/5ML (4 MCG/ML) IV SYRINGE
PREFILLED_SYRINGE | INTRAVENOUS | Status: DC | PRN
  Administered 2020-10-26: 8 ug via INTRAVENOUS
  Administered 2020-10-26: 12 ug via INTRAVENOUS

## 2020-10-26 MED ORDER — PROPOFOL 500 MG/50ML IV EMUL
INTRAVENOUS | Status: DC | PRN
  Administered 2020-10-26: 20 ug/kg/min via INTRAVENOUS

## 2020-10-26 MED ORDER — LIDOCAINE HCL 1 % IJ SOLN
INTRAMUSCULAR | Status: DC | PRN
  Administered 2020-10-26: 10 mL via INTRADERMAL

## 2020-10-26 MED ORDER — LACTATED RINGERS IV SOLN: INTRAVENOUS | Status: DC | PRN

## 2020-10-26 MED ORDER — SODIUM CHLORIDE 0.9% FLUSH: 3.0000 mL | INTRAVENOUS | Status: DC | PRN

## 2020-10-26 MED ORDER — HEPARIN SODIUM (PORCINE) 1000 UNIT/ML IJ SOLN
INTRAMUSCULAR | Status: AC
  Filled 2020-10-26: qty 1

## 2020-10-26 MED ORDER — ACETAMINOPHEN 650 MG RE SUPP: 650.0000 mg | Freq: Four times a day (QID) | RECTAL | Status: DC | PRN

## 2020-10-26 MED ORDER — SODIUM CHLORIDE 0.9% FLUSH
3.0000 mL | Freq: Two times a day (BID) | INTRAVENOUS | Status: DC
  Administered 2020-10-26 – 2020-10-28 (×4): 3 mL via INTRAVENOUS

## 2020-10-26 MED ORDER — NITROGLYCERIN IN D5W 200-5 MCG/ML-% IV SOLN: 0.0000 ug/min | INTRAVENOUS | Status: DC

## 2020-10-26 MED ORDER — MIDAZOLAM HCL 2 MG/2ML IJ SOLN
INTRAMUSCULAR | Status: AC
  Filled 2020-10-26: qty 2

## 2020-10-26 MED ORDER — PHENYLEPHRINE HCL-NACL 20-0.9 MG/250ML-% IV SOLN
0.0000 ug/min | INTRAVENOUS | Status: DC
  Administered 2020-10-26: 2 ug/min via INTRAVENOUS
  Filled 2020-10-26: qty 250

## 2020-10-26 MED ORDER — PROTAMINE SULFATE 10 MG/ML IV SOLN
INTRAVENOUS | Status: DC | PRN
  Administered 2020-10-26: 10 mg via INTRAVENOUS
  Administered 2020-10-26: 120 mg via INTRAVENOUS

## 2020-10-26 MED ORDER — PANTOPRAZOLE SODIUM 40 MG PO TBEC: 40.0000 mg | DELAYED_RELEASE_TABLET | Freq: Every day | ORAL | Status: DC

## 2020-10-26 MED ORDER — TRAMADOL HCL 50 MG PO TABS
50.0000 mg | ORAL_TABLET | ORAL | Status: DC | PRN
  Administered 2020-10-26: 100 mg via ORAL
  Filled 2020-10-26: qty 2

## 2020-10-26 MED ORDER — SODIUM CHLORIDE 0.9% IV SOLUTION: Freq: Once | INTRAVENOUS | Status: DC

## 2020-10-26 MED ORDER — ONDANSETRON HCL 4 MG/2ML IJ SOLN: 4.0000 mg | Freq: Four times a day (QID) | INTRAMUSCULAR | Status: DC | PRN

## 2020-10-26 MED ORDER — OXYCODONE HCL 5 MG PO TABS: 5.0000 mg | ORAL_TABLET | ORAL | Status: DC | PRN

## 2020-10-26 MED ORDER — SODIUM CHLORIDE 0.9 % IV SOLN
INTRAVENOUS | Status: AC
Start: 2020-10-26 — End: 2020-10-26

## 2020-10-26 MED ORDER — PHENYLEPHRINE HCL-NACL 20-0.9 MG/250ML-% IV SOLN
INTRAVENOUS | Status: AC
  Filled 2020-10-26: qty 250

## 2020-10-26 MED ORDER — MIDAZOLAM HCL 5 MG/5ML IJ SOLN
INTRAMUSCULAR | Status: DC | PRN
  Administered 2020-10-26: 1 mg via INTRAVENOUS

## 2020-10-26 MED ORDER — MORPHINE SULFATE (PF) 2 MG/ML IV SOLN
1.0000 mg | INTRAVENOUS | Status: DC | PRN
  Administered 2020-10-26: 2 mg via INTRAVENOUS
  Filled 2020-10-26: qty 1

## 2020-10-26 MED ORDER — HEPARIN SODIUM (PORCINE) 1000 UNIT/ML IJ SOLN
INTRAMUSCULAR | Status: DC | PRN
  Administered 2020-10-26: 13000 [IU] via INTRAVENOUS

## 2020-10-26 MED ORDER — SODIUM CHLORIDE 0.9 % IV SOLN
250.0000 mL | INTRAVENOUS | Status: DC | PRN
Start: 2020-10-26 — End: 2020-10-28

## 2020-10-26 MED ORDER — CEFAZOLIN SODIUM-DEXTROSE 2-4 GM/100ML-% IV SOLN
2.0000 g | Freq: Three times a day (TID) | INTRAVENOUS | Status: AC
  Administered 2020-10-26 (×2): 2 g via INTRAVENOUS
  Filled 2020-10-26 (×2): qty 100

## 2020-10-26 MED ORDER — SODIUM CHLORIDE 0.9 % IV SOLN
250.0000 mL | INTRAVENOUS | Status: DC
  Administered 2020-10-26: 250 mL via INTRAVENOUS

## 2020-10-26 MED ORDER — HEPARIN 6000 UNIT IRRIGATION SOLUTION
Status: DC | PRN
  Administered 2020-10-26: 1
  Administered 2020-10-26: 2

## 2020-10-26 MED ORDER — PROPOFOL 1000 MG/100ML IV EMUL
INTRAVENOUS | Status: AC
  Filled 2020-10-26: qty 100

## 2020-10-26 MED ORDER — IODIXANOL 320 MG/ML IV SOLN
INTRAVENOUS | Status: DC | PRN
  Administered 2020-10-26: 50 mL via INTRA_ARTERIAL

## 2020-10-26 MED ORDER — LIDOCAINE HCL (PF) 1 % IJ SOLN
INTRAMUSCULAR | Status: AC
  Filled 2020-10-26: qty 30

## 2020-10-26 SURGICAL SUPPLY — 86 items
ADH SKN CLS APL DERMABOND .7 (GAUZE/BANDAGES/DRESSINGS) ×2
APL PRP STRL LF DISP 70% ISPRP (MISCELLANEOUS) ×2
BAG COUNTER SPONGE SURGICOUNT (BAG) ×3 IMPLANT
BAG DECANTER FOR FLEXI CONT (MISCELLANEOUS) IMPLANT
BAG SNAP BAND KOVER 36X36 (MISCELLANEOUS) ×3 IMPLANT
BAG SPNG CNTER NS LX DISP (BAG) ×2
BLADE CLIPPER SURG (BLADE) IMPLANT
BLADE OSCILLATING /SAGITTAL (BLADE) IMPLANT
BLADE STERNUM SYSTEM 6 (BLADE) IMPLANT
BLADE SURG 10 STRL SS (BLADE) IMPLANT
CABLE ADAPT CONN TEMP 6FT (ADAPTER) ×3 IMPLANT
CATH DIAG EXPO 6F AL1 (CATHETERS) IMPLANT
CATH DIAG EXPO 6F VENT PIG 145 (CATHETERS) ×6 IMPLANT
CATH EXTERNAL FEMALE PUREWICK (CATHETERS) IMPLANT
CATH INFINITI 6F AL2 (CATHETERS) IMPLANT
CATH S G BIP PACING (CATHETERS) ×3 IMPLANT
CHLORAPREP W/TINT 26 (MISCELLANEOUS) ×3 IMPLANT
CLIP VESOCCLUDE MED 24/CT (CLIP) IMPLANT
CLIP VESOCCLUDE SM WIDE 24/CT (CLIP) IMPLANT
CLOSURE MYNX CONTROL 6F/7F (Vascular Products) ×2 IMPLANT
CNTNR URN SCR LID CUP LEK RST (MISCELLANEOUS) ×4 IMPLANT
CONT SPEC 4OZ STRL OR WHT (MISCELLANEOUS) ×6
COVER BACK TABLE 80X110 HD (DRAPES) ×3 IMPLANT
DECANTER SPIKE VIAL GLASS SM (MISCELLANEOUS) ×3 IMPLANT
DERMABOND ADVANCED (GAUZE/BANDAGES/DRESSINGS) ×1
DERMABOND ADVANCED .7 DNX12 (GAUZE/BANDAGES/DRESSINGS) ×2 IMPLANT
DEVICE CLOSURE PERCLS PRGLD 6F (VASCULAR PRODUCTS) ×4 IMPLANT
DRAPE INCISE IOBAN 66X45 STRL (DRAPES) IMPLANT
DRSG TEGADERM 4X4.75 (GAUZE/BANDAGES/DRESSINGS) ×6 IMPLANT
DRYSEAL FLEXSHEATH 18FR 33CM (SHEATH) ×1
ELECT CAUTERY BLADE 6.4 (BLADE) IMPLANT
ELECT REM PT RETURN 9FT ADLT (ELECTROSURGICAL) ×6
ELECTRODE REM PT RTRN 9FT ADLT (ELECTROSURGICAL) ×4 IMPLANT
FELT TEFLON 6X6 (MISCELLANEOUS) IMPLANT
GAUZE SPONGE 4X4 12PLY STRL (GAUZE/BANDAGES/DRESSINGS) ×3 IMPLANT
GLOVE EUDERMIC 7 POWDERFREE (GLOVE) IMPLANT
GLOVE SURG ENC MOIS LTX SZ7.5 (GLOVE) ×3 IMPLANT
GLOVE SURG ENC MOIS LTX SZ8 (GLOVE) IMPLANT
GLOVE SURG ORTHO LTX SZ7.5 (GLOVE) IMPLANT
GOWN STRL REUS W/ TWL LRG LVL3 (GOWN DISPOSABLE) IMPLANT
GOWN STRL REUS W/ TWL XL LVL3 (GOWN DISPOSABLE) ×2 IMPLANT
GOWN STRL REUS W/TWL LRG LVL3 (GOWN DISPOSABLE)
GOWN STRL REUS W/TWL XL LVL3 (GOWN DISPOSABLE) ×3
GUIDEWIRE SAFE TJ AMPLATZ EXST (WIRE) ×3 IMPLANT
INSERT FOGARTY SM (MISCELLANEOUS) IMPLANT
KIT BASIN OR (CUSTOM PROCEDURE TRAY) ×3 IMPLANT
KIT HEART LEFT (KITS) ×3 IMPLANT
KIT SUCTION CATH 14FR (SUCTIONS) IMPLANT
KIT TURNOVER KIT B (KITS) ×3 IMPLANT
LOOP VESSEL MAXI BLUE (MISCELLANEOUS) IMPLANT
LOOP VESSEL MINI RED (MISCELLANEOUS) IMPLANT
NS IRRIG 1000ML POUR BTL (IV SOLUTION) ×3 IMPLANT
PACK ENDO MINOR (CUSTOM PROCEDURE TRAY) ×3 IMPLANT
PAD ARMBOARD 7.5X6 YLW CONV (MISCELLANEOUS) ×6 IMPLANT
PAD ELECT DEFIB RADIOL ZOLL (MISCELLANEOUS) ×3 IMPLANT
PENCIL BUTTON HOLSTER BLD 10FT (ELECTRODE) IMPLANT
PERCLOSE PROGLIDE 6F (VASCULAR PRODUCTS) ×6
POSITIONER HEAD DONUT 9IN (MISCELLANEOUS) ×3 IMPLANT
SET MICROPUNCTURE 5F STIFF (MISCELLANEOUS) ×3 IMPLANT
SHEATH BRITE TIP 7FR 35CM (SHEATH) ×3 IMPLANT
SHEATH DRYSEAL FLEX 18FR 33CM (SHEATH) ×1 IMPLANT
SHEATH PINNACLE 6F 10CM (SHEATH) ×3 IMPLANT
SHEATH PINNACLE 8F 10CM (SHEATH) ×3 IMPLANT
SLEEVE REPOSITIONING LENGTH 30 (MISCELLANEOUS) ×3 IMPLANT
STOPCOCK MORSE 400PSI 3WAY (MISCELLANEOUS) ×6 IMPLANT
SUT ETHIBOND X763 2 0 SH 1 (SUTURE) IMPLANT
SUT GORETEX CV 4 TH 22 36 (SUTURE) IMPLANT
SUT GORETEX CV4 TH-18 (SUTURE) IMPLANT
SUT MNCRL AB 3-0 PS2 18 (SUTURE) IMPLANT
SUT PROLENE 5 0 C 1 36 (SUTURE) IMPLANT
SUT PROLENE 6 0 C 1 30 (SUTURE) IMPLANT
SUT SILK  1 MH (SUTURE) ×6
SUT SILK 1 MH (SUTURE) ×3 IMPLANT
SUT VIC AB 2-0 CT1 27 (SUTURE)
SUT VIC AB 2-0 CT1 TAPERPNT 27 (SUTURE) IMPLANT
SUT VIC AB 2-0 CTX 36 (SUTURE) IMPLANT
SUT VIC AB 3-0 SH 8-18 (SUTURE) IMPLANT
SYR 50ML LL SCALE MARK (SYRINGE) ×3 IMPLANT
SYR BULB IRRIG 60ML STRL (SYRINGE) IMPLANT
SYR MEDRAD MARK V 150ML (SYRINGE) ×3 IMPLANT
TOWEL GREEN STERILE (TOWEL DISPOSABLE) ×6 IMPLANT
TRANSDUCER W/STOPCOCK (MISCELLANEOUS) ×6 IMPLANT
TRAY FOLEY SLVR 16FR TEMP STAT (SET/KITS/TRAYS/PACK) IMPLANT
VALVE AORTIC EVOLUT PROPLUS 26 (Valve) ×2 IMPLANT
WIRE EMERALD 3MM-J .035X150CM (WIRE) ×3 IMPLANT
WIRE EMERALD 3MM-J .035X260CM (WIRE) ×3 IMPLANT

## 2020-10-26 NOTE — Progress Notes (Signed)
EVENING ROUNDS NOTE :     [address]       [place] [address]             [phone]                 Day of Surgery Procedure(s) (LRB): TRANSCATHETER AORTIC VALVE REPLACEMENT, TRANSFEMORAL (N/A) INTRAOPERATIVE TRANSTHORACIC ECHOCARDIOGRAM (N/A)   Total Length of Stay:  LOS: 6 days  Events:   No events Resting comfortably    BP (!) 118/39   Pulse (!) 45   Temp (!) 96.7 F (35.9 C) (Axillary)   Resp 15   Ht 5\' 1"  (1.549 m)   Wt 87.6 kg   SpO2 92%   BMI 36.49 kg/m          sodium chloride 50 mL/hr at 10/26/20 1900   sodium chloride     sodium chloride 25 mL/hr at 10/26/20 1900    ceFAZolin (ANCEF) IV Stopped (10/26/20 1455)   nitroGLYCERIN     phenylephrine (NEO-SYNEPHRINE) Adult infusion 2 mcg/min (10/26/20 1900)    I/O last 3 completed shifts: In: 1487.3 [P.O.:720; I.V.:666.9; IV Piggyback:100.4] Out: 3300 [Urine:3300]   CBC Latest Ref Rng & Units 10/26/2020 10/26/2020 10/25/2020  WBC 4.0 - 10.5 K/uL - 5.9 4.8  Hemoglobin 12.0 - 15.0 g/dL 10/27/2020) 8.0(D) 9.8(P)  Hematocrit 36.0 - 46.0 % 26.0(L) 28.6(L) 28.2(L)  Platelets 150 - 400 K/uL - 213 200    BMP Latest Ref Rng & Units 10/26/2020 10/26/2020 10/25/2020  Glucose 70 - 99 mg/dL 10/27/2020) 505(L) 976(B)  BUN 8 - 23 mg/dL 20 23 16   Creatinine 0.44 - 1.00 mg/dL 341(P ) 3.79)  BUN/Creat Ratio 12 - 28 - - -  Sodium 135 - 145 mmol/L 138 136 136  Potassium 3.5 - 5.1 mmol/L 4.2 4.5 4.1  Chloride 98 - 111 mmol/L 100 99 100  CO2 22 - 32 mmol/L - 28 27  Calcium 8.9 - 10.3 mg/dL - 0.24(O 9.9    ABG    Component Value Date/Time   PHART 7.446 10/26/2020 0503   PCO2ART 40.6 10/26/2020 0503   PO2ART 84.1 10/26/2020 0503   HCO3 27.6 10/26/2020 0503   TCO2 28 10/26/2020 0750   O2SAT 96.8 10/26/2020 0503       Aanya Haynes, MD 10/26/2020 7:04 PM

## 2020-10-26 NOTE — Progress Notes (Signed)
  Echocardiogram 2D Echocardiogram has been performed.  Warren Lacy Konrad Hoak RDCS 10/26/2020, 9:23 AM

## 2020-10-26 NOTE — CV Procedure (Signed)
[facility]  TAVR OPERATIVE NOTE   Date of Procedure:  10/26/2020  Preoperative Diagnosis: Severe Aortic Stenosis   Postoperative Diagnosis: Same   Procedure:   Transcatheter Aortic Valve Replacement - Transfemoral Approach  Medtronic Evolut Pro THV (size 26 mm, model # EVPROPLUS26US, serial # [id number])   Co-Surgeons:  Lauree Chandler, MD and Gaye Pollack, MD   Anesthesiologist:  Nyoka Cowden  Echocardiographer:  Johnsie Cancel  Pre-operative Echo Findings: Severe aortic stenosis Normal left ventricular systolic function  Post-operative Echo Findings: No paravalvular leak Normal left ventricular systolic function  BRIEF CLINICAL NOTE AND INDICATIONS FOR SURGERY  Mary Mccarthy is a 80 y.o. female with a hx of obesity, DMT2, non obstructive CAD, PUD/AVM, PAF on amio and Eliquis (initially diagnosed at the time of acute CVA in 08/2020), HTN, pulmonary HTN, chronic diastolic CHF with recent admission 09/2020 for acute CHF and diagnosed with severe AS with plans for TAVR today.  No obstructive CAD on cath.   During the course of the patient's preoperative work up they have been evaluated comprehensively by a multidisciplinary team of specialists coordinated through the [facility] in the [facility].  They have been demonstrated to suffer from symptomatic severe aortic stenosis as noted above. The patient has been counseled extensively as to the relative risks and benefits of all options for the treatment of severe aortic stenosis including long term medical therapy, conventional surgery for aortic valve replacement, and transcatheter aortic valve replacement.  The patient has been independently evaluated by Dr. Cyndia Bent with CT surgery and they are felt to be at high risk for conventional surgical aortic valve replacement. The surgeon indicated the patient would be a poor candidate for conventional surgery. Based upon review of all of  the patient's preoperative diagnostic tests they are felt to be candidate for transcatheter aortic valve replacement using the transfemoral approach as an alternative to high risk conventional surgery.    Following the decision to proceed with transcatheter aortic valve replacement, a discussion has been held regarding what types of management strategies would be attempted intraoperatively in the event of life-threatening complications, including whether or not the patient would be considered a candidate for the use of cardiopulmonary bypass and/or conversion to open sternotomy for attempted surgical intervention.  The patient has been advised of a variety of complications that might develop peculiar to this approach including but not limited to risks of death, stroke, paravalvular leak, aortic dissection or other major vascular complications, aortic annulus rupture, device embolization, cardiac rupture or perforation, acute myocardial infarction, arrhythmia, heart block or bradycardia requiring permanent pacemaker placement, congestive heart failure, respiratory failure, renal failure, pneumonia, infection, other late complications related to structural valve deterioration or migration, or other complications that might ultimately cause a temporary or permanent loss of functional independence or other long term morbidity.  The patient provides full informed consent for the procedure as described and all questions were answered preoperatively.    DETAILS OF THE OPERATIVE PROCEDURE  PREPARATION:   The patient is brought to the operating room on the above mentioned date and central monitoring was established by the anesthesia team including placement of a radial arterial line. The patient is placed in the supine position on the operating table.  Intravenous antibiotics are administered. Conscious sedation is used.   Baseline transthoracic echocardiogram was performed. The patient's chest, abdomen, both  groins, and both lower extremities are prepared and draped in a sterile manner. A time out procedure  is performed.   PERIPHERAL ACCESS:   Using the modified Seldinger technique, femoral arterial and venous access were obtained with placement of a 6 Fr sheath in the artery and a 7 Fr sheath in the vein on the left side using u/s guidance.  A pigtail diagnostic catheter was passed through the femoral arterial sheath under fluoroscopic guidance into the aortic root.  A temporary transvenous pacemaker catheter was passed through the femoral venous sheath under fluoroscopic guidance into the right ventricle.  The pacemaker was tested to ensure stable lead placement and pacemaker capture. Aortic root angiography was performed in order to determine the optimal angiographic angle for valve deployment.  TRANSFEMORAL ACCESS:  A micropuncture kit was used to gain access to the right femoral artery using u/s guidance. Position confirmed with angiography. Pre-closure with double ProGlide closure devices. The patient was heparinized systemically and ACT verified > 250 seconds.    A 18 Fr transfemoral Dry Seal sheath was introduced into the right femoral artery over an Amplatz superstiff wire. An AL-1 catheter was used to direct a straight-tip exchange length wire across the native aortic valve into the left ventricle. This was exchanged out for a pigtail catheter and position was confirmed in the LV apex. Simultaneous LV and Ao pressures were recorded.  The pigtail catheter was then exchanged for an Confida wire in the LV apex.   TRANSCATHETER HEART VALVE DEPLOYMENT:  A Medtronic Evolut Pro THV size 26 mm was prepared and per manufacturer's guidelines, and the proper orientation of the valve is confirmed on the delivery system.  The valve was advanced through the introducer sheath into the descending aorta. The valve was then advanced across the aortic arch. The valve was carefully positioned across the aortic valve  annulus. Once final position of the valve has been confirmed with angiographic assessment in the cusp overlap view and in the LAO view, the valve is deployed with controlled rapid pacing. The valve is taken to 80% deployment and appropriate depth is confirmed. The valve is then released from each paddle. There is no valvular leak and no central aortic insufficiency.  The patient's hemodynamic recovery following valve deployment is good.  Echo demostrated acceptable post-procedural gradients, stable mitral valve function, and no AI.   PROCEDURE COMPLETION:  The sheath was then removed and closure devices were completed. Protamine was administered once femoral arterial repair was complete. The temporary pacemaker was left in place due to transient high grade AV block. The pigtail catheter and femoral arterial sheath was removed with a Mynx closure device placed in the artery.     The patient tolerated the procedure well and is transported to the surgical intensive care in stable condition. There were no immediate intraoperative complications. All sponge instrument and needle counts are verified correct at completion of the operation.   No blood products were administered during the operation.  The patient received a total of 50 mL of intravenous contrast during the procedure.  Lauree Chandler MD 10/26/2020 9:54 AM

## 2020-10-26 NOTE — Anesthesia Postprocedure Evaluation (Signed)
Anesthesia Post Note  Patient: Mary Mccarthy  Procedure(s) Performed: TRANSCATHETER AORTIC VALVE REPLACEMENT, TRANSFEMORAL (Chest) INTRAOPERATIVE TRANSTHORACIC ECHOCARDIOGRAM (Chest)     Patient location during evaluation: SICU Anesthesia Type: MAC Level of consciousness: awake Pain management: pain level controlled Vital Signs Assessment: post-procedure vital signs reviewed and stable Cardiovascular status: stable Postop Assessment: no apparent nausea or vomiting Anesthetic complications: no   No notable events documented.  Last Vitals:  Vitals:   10/26/20 1330 10/26/20 1400  BP: (!) 102/42 (!) 107/42  Pulse: (!) 49 (!) 44  Resp: (!) 22 17  Temp:    SpO2: 94% 95%    Last Pain:  Vitals:   10/26/20 1159  TempSrc:   PainSc: 3                  Thong Feeny

## 2020-10-26 NOTE — Interval H&P Note (Signed)
History and Physical Interval Note:  10/26/2020 6:10 AM  Mary Mccarthy  has presented today for surgery, with the diagnosis of Severe Aortic Stenosis.  The various methods of treatment have been discussed with the patient and family. After consideration of risks, benefits and other options for treatment, the patient has consented to  Procedure(s): TRANSCATHETER AORTIC VALVE REPLACEMENT, TRANSFEMORAL (N/A) TRANSESOPHAGEAL ECHOCARDIOGRAM (TEE) (N/A) as a surgical intervention.  The patient's history has been reviewed, patient examined, no change in status, stable for surgery.  I have reviewed the patient's chart and labs.  Questions were answered to the patient's satisfaction.     Alleen Borne

## 2020-10-26 NOTE — Progress Notes (Signed)
  [facility]   MULTIDISCIPLINARY HEART VALVE TEAM  Patient doing well s/p TAVR. She is hemodynamically stable. Groin sites stable, temp wire in left groin. ECG with sinus brady HR 49 bpm, but no high grade block. Currently not pacing. Will keep her temp wire in until tomorrow am and pull if heart rhythm is stable.    Cline Crock PA-C  [facility]  Pager [phone]

## 2020-10-26 NOTE — Anesthesia Procedure Notes (Signed)
Arterial Line Insertion Start/End9/20/2022 7:00 AM, 10/26/2020 7:05 AM Performed by: Dorris Singh, MD, Macie Burows, CRNA, CRNA  Patient location: Pre-op. Preanesthetic checklist: patient identified, IV checked, monitors and equipment checked and pre-op evaluation radial was placed Catheter size: 20 G Hand hygiene performed  and maximum sterile barriers used  Allen's test indicative of satisfactory collateral circulation Attempts: 1 Procedure performed without using ultrasound guided technique. Following insertion, dressing applied and Biopatch. Post procedure assessment: normal  Patient tolerated the procedure well with no immediate complications.

## 2020-10-26 NOTE — Op Note (Signed)
[facility]   MULTIDISCIPLINARY HEART VALVE TEAM     TAVR OPERATIVE NOTE    Mary Mccarthy [id number]  Date of Procedure:                 10/26/2020   Preoperative Diagnosis:      Severe Aortic Stenosis    Postoperative Diagnosis:    Same    Procedure:        Transcatheter Aortic Valve Replacement - Percutaneous Right Transfemoral Approach             Medtronic Evolut-Pro + (size 26 mm, model # EVPROPLUS -26US, serial # [id number])              Co-Surgeons:            Alleen Borne, MD and Verne Carrow, MD     Anesthesiologist:                  Andres Labrum, MD   Echocardiographer:              Burna Forts, MD   Pre-operative Echo Findings: Severe aortic stenosis and moderate AI  Normal left ventricular systolic function   Post-operative Echo Findings: No paravalvular leak Normal left ventricular systolic function      BRIEF CLINICAL NOTE AND INDICATIONS FOR SURGERY    This 80 year old woman has stage D, severe, symptomatic aortic stenosis with New York Heart Association class III symptoms of exertional fatigue and shortness of breath as well as orthopnea and peripheral edema consistent with chronic diastolic congestive heart failure.  She has a history of GI bleeding in 2017 and was noted to have AVMs at that time.  She was started on Eliquis in July 2022 after presenting with new onset atrial fibrillation and a stroke.  She has been undergoing work-up for possible TAVR for severe aortic stenosis but was readmitted on 913 with acute shortness of breath and black tarry stools.  Her hemoglobin dropped to 7.4.  She underwent upper endoscopy showing gastritis but no sign of active bleeding.  It was suspected that her anemia and melena were due to bleeding from gastric erosions related to her recent Eliquis use.  I have personally reviewed her 2D echocardiogram, cardiac catheterization, and CTA studies.  Her echocardiogram shows a severely calcified aortic valve  with restricted leaflet mobility and a mean gradient of 42 mmHg consistent with severe aortic stenosis.  Aortic valve area of 0.86 cm.  Left ventricular ejection fraction is 55 to 60%.  Cardiac catheterization shows mild nonobstructive coronary disease with a mean gradient across aortic valve of 53 mmHg.  Gated cardiac CTA shows a relatively small annular area of 328 mm which would be suitable for a 20 mm Edwards SAPIEN 3 valve or a 23 mm Medtronic Evolut valve.  Her aortic sinus of Valsalva measurements are 26 to 27 mm which would be borderline for a 26 mm Medtronic Evolut valve.  Her abdominal and pelvic CTA shows adequate pelvic vascular anatomy to allow transfemoral insertion. Unfortunately she also has a candida rash in her groins. She is on diflucan. Will add topical mycostatin cream.    The patient was counseled at length regarding treatment alternatives for management of severe symptomatic aortic stenosis. The risks and benefits of surgical intervention has been discussed in detail. Long-term prognosis with medical therapy was discussed. Alternative approaches such as conventional surgical aortic valve replacement, transcatheter aortic valve replacement, and palliative medical therapy were compared and contrasted  at length. This discussion was placed in the context of the patient's own specific clinical presentation and past medical history. All of her questions have been addressed.    Following the decision to proceed with transcatheter aortic valve replacement, a discussion was held regarding what types of management strategies would be attempted intraoperatively in the event of life-threatening complications, including whether or not the patient would be considered a candidate for the use of cardiopulmonary bypass and/or conversion to open sternotomy for attempted surgical intervention. The patient is aware of the fact that transient use of cardiopulmonary bypass may be necessary. Given her age and  comorbid risk factors I would only consider a sternotomy emergently under limited circumstances. The patient has been advised of a variety of complications that might develop including but not limited to risks of death, stroke, paravalvular leak, aortic dissection or other major vascular complications, aortic annulus rupture, device embolization, cardiac rupture or perforation, mitral regurgitation, acute myocardial infarction, arrhythmia, heart block or bradycardia requiring permanent pacemaker placement, congestive heart failure, respiratory failure, renal failure, pneumonia, infection, other late complications related to structural valve deterioration or migration, or other complications that might ultimately cause a temporary or permanent loss of functional independence or other long term morbidity. The patient provides full informed consent for the procedure as described and all questions were answered.        DETAILS OF THE OPERATIVE PROCEDURE   PREPARATION:     The patient is brought to the operating room on the above mentioned date and central monitoring was established by the anesthesia team including placement of a central venous line and radial arterial line. The patient is placed in the supine position on the operating table.  Intravenous antibiotics are administered. The patient was continuously monitored by anesthesia under conscious sedation.   Baseline transthoracic echocardiogram was performed. The patient's abdomen and both groins are prepared and draped in a sterile manner. A time out procedure is performed.     PERIPHERAL ACCESS:     Using the modified Seldinger technique, femoral arterial and venous access was obtained with placement of 6 Fr sheaths on the left side.  A pigtail diagnostic catheter was passed through the left arterial sheath under fluoroscopic guidance into the aortic root.  A temporary transvenous pacemaker catheter was passed through the left femoral venous  sheath under fluoroscopic guidance into the right ventricle.  The pacemaker was tested to ensure stable lead placement and pacemaker capture.      TRANSFEMORAL ACCESS:    Percutaneous transfemoral access and sheath placement was performed  using ultrasound guidance.  The right common femoral artery was cannulated using a micropuncture needle.  A pair of Abbott Perclose percutaneous closure devices were placed and a 6 French sheath replaced into the femoral artery.  The patient was heparinized systemically and ACT verified > 250 seconds.     An 18 Fr Gore Dry-Seal sheath was introduced into the right femoral artery  over an Amplatz superstiff wire. An AL-1 catheter was used to direct a straight-tip exchange length wire across the native aortic valve into the left ventricle. This was exchanged out for a pigtail catheter and position was confirmed in the LV apex. Simultaneous LV and Ao pressures were recorded.  The pigtail catheter was exchanged for a Confida wire in the LV apex.       BALLOON AORTIC VALVULOPLASTY:    Not performed   TRANSCATHETER HEART VALVE DEPLOYMENT:    A Medtronic Evolut-Pro+ transcatheter heart valve (size 26  mm) was prepared and loaded into an EnVeo PRO delivery catheter system per manufacturer's guidelines and the proper orientation of the valve is confirmed under fluoroscopy. The delivery system and inline sheath were inserted into the sheath in the right common femoral artery over the Confida wire and the inline sheath advanced into the abdominal aorta under fluoroscopic guidance. The delivery catheter was advanced around the aortic arch and the valve was carefully positioned across the aortic valve annulus. Cusp overlap technique was used. The patient was positioned in the cusp overlap view and partially deployed. An aortic root injection was performed to confirm position and the patient turned into the LAO view to remove parallax. The valve was fully deployed using  intermittent pacing was used during valve deployment. The delivery system and guidewire were retracted into the descending aorta and the nosecone re-sheathed. Valve function is assessed using echocardiography. There is felt to be no paravalvular leak and no central aortic insufficiency.  Aortography confirmed no AI and normal coronary flow.  The patient's hemodynamic recovery following valve deployment is good.        PROCEDURE COMPLETION:    The delivery system and in-line sheath were removed and femoral artery closure performed using the Perclose devices.   Protamine was administered once femoral arterial repair was complete. The temporary pacemaker, pigtail catheters and femoral sheaths were removed with manual pressure used for hemostasis and Mynx closure used for the left femoral artery.    The patient tolerated the procedure well and is transported to the surgical intensive care in stable condition. There were no immediate intraoperative complications. All sponge instrument and needle counts are verified correct at completion of the operation.    No blood products were administered during the operation.   The patient received a total of 50 mL of intravenous contrast during the procedure.     Alleen Borne, MD

## 2020-10-26 NOTE — Transfer of Care (Signed)
Immediate Anesthesia Transfer of Care Note  Patient: Mary Mccarthy  Procedure(s) Performed: TRANSCATHETER AORTIC VALVE REPLACEMENT, TRANSFEMORAL (Chest) INTRAOPERATIVE TRANSTHORACIC ECHOCARDIOGRAM (Chest)  Patient Location: ICU  Anesthesia Type:MAC  Level of Consciousness: awake, alert  and oriented  Airway & Oxygen Therapy: Patient Spontanous Breathing and Patient connected to face mask oxygen  Post-op Assessment: Report given to RN and Post -op Vital signs reviewed and stable  Post vital signs: Reviewed and stable  Last Vitals:  Vitals Value Taken Time  BP    Temp    Pulse 61 10/26/20 1043  Resp 28 10/26/20 1043  SpO2 100 % 10/26/20 1043  Vitals shown include unvalidated device data.  Last Pain:  Vitals:   10/26/20 0604  TempSrc: Oral  PainSc:       Patients Stated Pain Goal: 0 (16/10/96 0454)  Complications: No notable events documented.

## 2020-10-26 NOTE — Anesthesia Postprocedure Evaluation (Deleted)
Anesthesia Post Note  Patient: Mary Mccarthy  Procedure(s) Performed: TRANSCATHETER AORTIC VALVE REPLACEMENT, TRANSFEMORAL (Chest) INTRAOPERATIVE TRANSTHORACIC ECHOCARDIOGRAM (Chest)     Patient location during evaluation: SICU Anesthesia Type: MAC Level of consciousness: awake Pain management: pain level controlled Respiratory status: spontaneous breathing Cardiovascular status: stable Postop Assessment: no apparent nausea or vomiting Anesthetic complications: no   No notable events documented.  Last Vitals:  Vitals:   10/26/20 1330 10/26/20 1400  BP: (!) 102/42 (!) 107/42  Pulse: (!) 49 (!) 44  Resp: (!) 22 17  Temp:    SpO2: 94% 95%    Last Pain:  Vitals:   10/26/20 1159  TempSrc:   PainSc: 3                  [name]

## 2020-10-26 NOTE — Discharge Instructions (Signed)
ACTIVITY AND EXERCISE °• Daily activity and exercise are an important part of your recovery. People recover at different rates depending on their general health and type of valve procedure. °• Most people recovering from TAVR feel better relatively quickly  °• No lifting, pushing, pulling more than 10 pounds (examples to avoid: groceries, vacuuming, gardening, golfing): °            - For one week with a procedure through the groin. °            - For six weeks for procedures through the chest wall or neck. °NOTE: You will typically see one of our providers 7-14 days after your procedure to discuss WHEN TO RESUME the above activities.  °  °  °DRIVING °• Do not drive until you are seen for follow up and cleared by a provider. Generally, we ask patient to not drive for 1 week after their procedure. °• If you have been told by your doctor in the past that you may not drive, you must talk with him/her before you begin driving again. °  °DRESSING °• Groin site: you may leave the clear dressing over the site for up to one week or until it falls off. °  °HYGIENE °• If you had a femoral (leg) procedure, you may take a shower when you return home. After the shower, pat the site dry. Do NOT use powder, oils or lotions in your groin area until the site has completely healed. °• If you had a chest procedure, you may shower when you return home unless specifically instructed not to by your discharging practitioner. °            - DO NOT scrub incision; pat dry with a towel. °            - DO NOT apply any lotions, oils, powders to the incision. °            - No tub baths / swimming for at least 2 weeks. °• If you notice any fevers, chills, increased pain, swelling, bleeding or pus, please contact your doctor. °  °ADDITIONAL INFORMATION °• If you are going to have an upcoming dental procedure, please contact our office as you will require antibiotics ahead of time to prevent infection on your heart valve.  ° ° °If you have any  questions or concerns you can call the structural heart phone during normal business hours 8am-4pm. If you have an urgent need after hours or weekends please call [phone] to talk to the on call provider for general cardiology. If you have an emergency that requires immediate attention, please call 911.  ° ° °After TAVR Checklist ° °Check  Test Description  ° Follow up appointment in 1-2 weeks  You will see our structural heart physician assistant, Katie Cherina Dhillon. Your incision sites will be checked and you will be cleared to drive and resume all normal activities if you are doing well.    ° 1 month echo and follow up  You will have an echo to check on your new heart valve and be seen back in the office by Katie Britlee Skolnik. Many times the echo is not read by your appointment time, but Katie will call you later that day or the following day to report your results.  ° Follow up with your primary cardiologist You will need to be seen by your primary cardiologist in the following 3-6 months after your 1 month appointment in the valve   clinic. Often times your Plavix or Aspirin will be discontinued during this time, but this is decided on a case by case basis.   ° 1 year echo and follow up You will have another echo to check on your heart valve after 1 year and be seen back in the office by Katie Loralye Loberg. This your last structural heart visit.  ° Bacterial endocarditis prophylaxis  You will have to take antibiotics for the rest of your life before all dental procedures (even teeth cleanings) to protect your heart valve. Antibiotics are also required before some surgeries. Please check with your cardiologist before scheduling any surgeries. Also, please make sure to tell us if you have a penicillin allergy as you will require an alternative antibiotic.   ° ° °

## 2020-10-27 ENCOUNTER — Encounter (HOSPITAL_COMMUNITY): Payer: Self-pay | Admitting: Cardiovascular Disease

## 2020-10-27 ENCOUNTER — Inpatient Hospital Stay (HOSPITAL_COMMUNITY): Payer: Medicare Other

## 2020-10-27 DIAGNOSIS — Z20822 Contact with and (suspected) exposure to covid-19: Secondary | ICD-10-CM | POA: Diagnosis not present

## 2020-10-27 DIAGNOSIS — Z954 Presence of other heart-valve replacement: Secondary | ICD-10-CM

## 2020-10-27 DIAGNOSIS — D649 Anemia, unspecified: Secondary | ICD-10-CM | POA: Diagnosis not present

## 2020-10-27 DIAGNOSIS — K254 Chronic or unspecified gastric ulcer with hemorrhage: Secondary | ICD-10-CM | POA: Diagnosis not present

## 2020-10-27 DIAGNOSIS — Z952 Presence of prosthetic heart valve: Secondary | ICD-10-CM | POA: Diagnosis not present

## 2020-10-27 DIAGNOSIS — D62 Acute posthemorrhagic anemia: Secondary | ICD-10-CM | POA: Diagnosis not present

## 2020-10-27 DIAGNOSIS — I350 Nonrheumatic aortic (valve) stenosis: Secondary | ICD-10-CM

## 2020-10-27 DIAGNOSIS — Z006 Encounter for examination for normal comparison and control in clinical research program: Secondary | ICD-10-CM | POA: Diagnosis not present

## 2020-10-27 LAB — GLUCOSE, CAPILLARY
Glucose-Capillary: 110 mg/dL — ABNORMAL HIGH (ref 70–99)
Glucose-Capillary: 259 mg/dL — ABNORMAL HIGH (ref 70–99)
Glucose-Capillary: 165 mg/dL — ABNORMAL HIGH (ref 70–99)
Glucose-Capillary: 196 mg/dL — ABNORMAL HIGH (ref 70–99)

## 2020-10-27 LAB — BASIC METABOLIC PANEL
Anion gap: 10 (ref 5–15)
BUN: 20 mg/dL (ref 8–23)
CO2: 24 mmol/L (ref 22–32)
Calcium: 9.2 mg/dL (ref 8.9–10.3)
Chloride: 101 mmol/L (ref 98–111)
Creatinine, Ser: 1.07 mg/dL — ABNORMAL HIGH (ref 0.44–1.00)
GFR, Estimated: 52 mL/min — ABNORMAL LOW (ref >60)
Glucose, Bld: 124 mg/dL — ABNORMAL HIGH (ref 70–99)
Potassium: 4.3 mmol/L (ref 3.5–5.1)
Sodium: 135 mmol/L (ref 135–145)

## 2020-10-27 LAB — ECHOCARDIOGRAM COMPLETE
AR max vel: 1.66 cm2
AV Area VTI: 1.79 cm2
AV Area mean vel: 1.7 cm2
AV Mean grad: 12 mmHg
AV Peak grad: 24.1 mmHg
Ao pk vel: 2.45 m/s
Area-P 1/2: 2.8 cm2
Calc EF: 75.4 %
Height: 61 in
S' Lateral: 3 cm
Single Plane A2C EF: 79.5 %
Single Plane A4C EF: 71.2 %
Weight: 3089.97 oz

## 2020-10-27 LAB — CBC
HCT: 26.1 % — ABNORMAL LOW (ref 36.0–46.0)
Hemoglobin: 8.8 g/dL — ABNORMAL LOW (ref 12.0–15.0)
MCH: 32 pg (ref 26.0–34.0)
MCHC: 33.7 g/dL (ref 30.0–36.0)
MCV: 94.9 fL (ref 80.0–100.0)
Platelets: 124 10*3/uL — ABNORMAL LOW (ref 150–400)
RBC: 2.75 MIL/uL — ABNORMAL LOW (ref 3.87–5.11)
RDW: 15.5 % (ref 11.5–15.5)
WBC: 6.7 10*3/uL (ref 4.0–10.5)
nRBC: 0 % (ref 0.0–0.2)

## 2020-10-27 LAB — MAGNESIUM: Magnesium: 1.7 mg/dL (ref 1.7–2.4)

## 2020-10-27 LAB — HEMOGLOBIN A1C
Hgb A1c MFr Bld: 5.8 % — ABNORMAL HIGH (ref 4.8–5.6)
Mean Plasma Glucose: 120 mg/dL

## 2020-10-27 MED ORDER — AMIODARONE HCL 200 MG PO TABS
200.0000 mg | ORAL_TABLET | Freq: Every day | ORAL | Status: DC
  Administered 2020-10-27 – 2020-10-28 (×2): 200 mg via ORAL
  Filled 2020-10-27 (×2): qty 1

## 2020-10-27 MED ORDER — MAGNESIUM SULFATE 2 GM/50ML IV SOLN
2.0000 g | Freq: Once | INTRAVENOUS | Status: AC
  Administered 2020-10-27: 2 g via INTRAVENOUS
  Filled 2020-10-27: qty 50

## 2020-10-27 MED ORDER — APIXABAN 5 MG PO TABS
5.0000 mg | ORAL_TABLET | Freq: Two times a day (BID) | ORAL | Status: DC
  Administered 2020-10-27 – 2020-10-28 (×2): 5 mg via ORAL
  Filled 2020-10-27 (×3): qty 1

## 2020-10-27 MED ORDER — PERFLUTREN LIPID MICROSPHERE
1.0000 mL | INTRAVENOUS | Status: AC | PRN
  Administered 2020-10-27: 2 mL via INTRAVENOUS
  Filled 2020-10-27: qty 10

## 2020-10-27 MED ORDER — HYDRALAZINE HCL 50 MG PO TABS
100.0000 mg | ORAL_TABLET | Freq: Three times a day (TID) | ORAL | Status: DC
  Administered 2020-10-27 – 2020-10-28 (×3): 100 mg via ORAL
  Filled 2020-10-27 (×3): qty 2

## 2020-10-27 NOTE — Progress Notes (Signed)
  Echocardiogram 2D Echocardiogram with defintiy has been performed.  Leta Jungling M 10/27/2020, 8:18 AM

## 2020-10-27 NOTE — Progress Notes (Signed)
CARDIAC REHAB PHASE I   PRE:  Rate/Rhythm: 59 SB  BP:  Supine: 147/31  Sitting:   Standing:    SaO2: 99%RA  MODE:  Ambulation:  to recliner ft   POST:  Rate/Rhythm: 61 SR  BP:  Supine:   Sitting: 127/82  Standing:    SaO2: 98%RA 1105-1145 Pt assisted to recliner. Did not walk as pt had not been up yet and still weak.  C/o groins sore. Needed assistance to get legs off bed.Took couple of steps to recliner  with assistance. Bed was wet from purewick. Pt knows to call when she needs to go to bathroom as purewick d/ced. NT aware of need for linen change. Denied dizziness when standing.  Set up lunch and gave warm blankets. Slight chills. Turned up room heat. Would see as asst x 2 for walking.   Luetta Nutting, RN BSN  10/27/2020 11:36 AM

## 2020-10-27 NOTE — Progress Notes (Signed)
Pt arrived to 4e from 2h. Pt oriented to room and staff. Vitals obtained. Telemetry box applied and CCMD notified x2 verifiers. Pt denies needs at this time.  ?

## 2020-10-27 NOTE — Progress Notes (Signed)
1 Day Post-Op Procedure(s) (LRB): TRANSCATHETER AORTIC VALVE REPLACEMENT, TRANSFEMORAL (N/A) INTRAOPERATIVE TRANSTHORACIC ECHOCARDIOGRAM (N/A) Subjective: Only complaint is being tired of laying on back in bed.  Monitor reviewed: there was no high grade heart block since procedure. HR sinus brady 50's-60.  ECG pending this am.  Objective: Vital signs in last 24 hours: Temp:  [96.7 F (35.9 C)-97.8 F (36.6 C)] 97.8 F (36.6 C) (09/21 0345) Pulse Rate:  [44-94] 54 (09/21 0600) Cardiac Rhythm: Sinus bradycardia (09/20 2000) Resp:  [12-25] 17 (09/21 0600) BP: (94-160)/(37-136) 114/102 (09/21 0600) SpO2:  [90 %-100 %] 99 % (09/21 0600) Arterial Line BP: (107-186)/(33-59) 183/42 (09/21 0600)  Hemodynamic parameters for last 24 hours:    Intake/Output from previous day: 09/20 0701 - 09/21 0700 In: 606.5 [I.V.:506.1; IV Piggyback:100.4] Out: 700 [Urine:700] Intake/Output this shift: No intake/output data recorded.  General appearance: alert and cooperative Neurologic: intact Heart: regular rate and rhythm, S1, S2 normal, no murmur Lungs: clear to auscultation bilaterally Abdomen: soft, non-tender; bowel sounds normal Extremities: extremities normal Wound: groin sites ok. Pacer on left.  Lab Results: Recent Labs    10/26/20 0342 10/26/20 0750 10/27/20 0454  WBC 5.9  --  6.7  HGB 9.3* 8.8* 8.8*  HCT 28.6* 26.0* 26.1*  PLT 213  --  124*   BMET:  Recent Labs    10/26/20 0342 10/26/20 0750 10/27/20 0454  NA 136 138 135  K 4.5 4.2 4.3  CL 99 100 101  CO2 28  --  24  GLUCOSE 164* 167* 124*  BUN 23 20 20   CREATININE 1.27* 1.00 1.07*  CALCIUM 10.1  --  9.2    PT/INR:  Recent Labs    10/25/20 2031  LABPROT 13.5  INR 1.0   ABG    Component Value Date/Time   PHART 7.446 10/26/2020 0503   HCO3 27.6 10/26/2020 0503   TCO2 28 10/26/2020 0750   O2SAT 96.8 10/26/2020 0503   CBG (last 3)  Recent Labs    10/26/20 1530 10/26/20 2007 10/27/20 0643  GLUCAP  143* 141* 110*    Assessment/Plan: S/P Procedure(s) (LRB): TRANSCATHETER AORTIC VALVE REPLACEMENT, TRANSFEMORAL (N/A) INTRAOPERATIVE TRANSTHORACIC ECHOCARDIOGRAM (N/A)  POD 1 Hemodynamically stable in sinus rhythm 50's which is unchanged from preop. She is on amio 200 bid.  I think temp pacer can be removed and continue to monitor rhythm today.  DC arterial line  2D echo  Transfer to 4E and mobilize.     LOS: 7 days    10/29/20 10/27/2020

## 2020-10-27 NOTE — Progress Notes (Signed)
Cardiology Progress Note  Patient ID: Charlott Mccarthy MRN: [id number] DOB: [birth year] Date of Encounter: 10/27/2020  Primary Cardiologist: Bryan Lemma, MD  Subjective   Chief Complaint: Shortness of breath  HPI: Doing well after TAVR yesterday.  Not requiring any pacing.  Pacing wire to come out.  A-line to come out.  Blood pressure elevated.  Hydralazine increased.  ROS:  All other ROS reviewed and negative. Pertinent positives noted in the HPI.     Inpatient Medications  Scheduled Meds:  sodium chloride   Intravenous Once   amiodarone  200 mg Oral BID   amLODipine  10 mg Oral Daily   atorvastatin  40 mg Oral Daily   Chlorhexidine Gluconate Cloth  6 each Topical Daily   fluconazole  200 mg Oral Daily   furosemide  40 mg Oral Daily   hydrALAZINE  100 mg Oral Q8H   insulin aspart  0-5 Units Subcutaneous QHS   insulin aspart  0-9 Units Subcutaneous TID WC   nystatin cream   Topical BID   pantoprazole  40 mg Oral BID AC   potassium chloride  20 mEq Oral Daily   sodium chloride flush  3 mL Intravenous Q12H   sodium chloride flush  3 mL Intravenous Q12H   Continuous Infusions:  sodium chloride     sodium chloride 25 mL/hr at 10/26/20 1900   magnesium sulfate bolus IVPB 2 g (10/27/20 0641)   phenylephrine (NEO-SYNEPHRINE) Adult infusion 2 mcg/min (10/26/20 1900)   PRN Meds: sodium chloride, acetaminophen **OR** acetaminophen, albuterol, ALPRAZolam, hydrALAZINE, morphine injection, ondansetron (ZOFRAN) IV, ondansetron **OR** [DISCONTINUED] ondansetron (ZOFRAN) IV, oxyCODONE, sodium chloride flush, traMADol   Vital Signs   Vitals:   10/27/20 0345 10/27/20 0400 10/27/20 0500 10/27/20 0600  BP:  (!) 154/44 (!) 154/62 (!) 114/102  Pulse:  (!) 56 (!) 54 (!) 54  Resp:  17 (!) 21 17  Temp: 97.8 F (36.6 C)     TempSrc: Oral     SpO2:  99% 99% 99%  Weight:      Height:        Intake/Output Summary (Last 24 hours) at 10/27/2020 0728 Last data filed at 10/27/2020 0600 Gross  per 24 hour  Intake 606.52 ml  Output 700 ml  Net -93.48 ml   Last 3 Weights 10/26/2020 10/25/2020 10/25/2020  Weight (lbs) 193 lb 2 oz 194 lb 14.2 oz 194 lb 14.2 oz  Weight (kg) 87.6 kg 88.4 kg 88.4 kg      Telemetry  Overnight telemetry shows SB 50s, which I personally reviewed.   ECG  The most recent ECG shows SB 49 bpm, which I personally reviewed.   Physical Exam   Vitals:   10/27/20 0345 10/27/20 0400 10/27/20 0500 10/27/20 0600  BP:  (!) 154/44 (!) 154/62 (!) 114/102  Pulse:  (!) 56 (!) 54 (!) 54  Resp:  17 (!) 21 17  Temp: 97.8 F (36.6 C)     TempSrc: Oral     SpO2:  99% 99% 99%  Weight:      Height:        Intake/Output Summary (Last 24 hours) at 10/27/2020 0728 Last data filed at 10/27/2020 0600 Gross per 24 hour  Intake 606.52 ml  Output 700 ml  Net -93.48 ml    Last 3 Weights 10/26/2020 10/25/2020 10/25/2020  Weight (lbs) 193 lb 2 oz 194 lb 14.2 oz 194 lb 14.2 oz  Weight (kg) 87.6 kg 88.4 kg 88.4 kg    Body mass  index is 36.49 kg/m.   General: Well nourished, well developed, in no acute distress Head: Atraumatic, normal size  Eyes: PEERLA, EOMI  Neck: Supple, no JVD Endocrine: No thryomegaly Cardiac: Normal S1, S2; RRR; 2/6 SEM Lungs: diminished bilaterally  Abd: Soft, nontender, no hepatomegaly  Ext: No edema, pulses 2+, R/L groin sites with 2+ pulses, no hematoma or bruit  Musculoskeletal: No deformities, BUE and BLE strength normal and equal Skin: Warm and dry, no rashes   Neuro: Alert and oriented to person, place, time, and situation, CNII-XII grossly intact, no focal deficits  Psych: Normal mood and affect   Labs  High Sensitivity Troponin:   Recent Labs  Lab 09/27/20 0935 10/19/20 0915 10/19/20 1326  TROPONINIHS 74* 45* 45*     Cardiac EnzymesNo results for input(s): TROPONINI in the last 168 hours. No results for input(s): TROPIPOC in the last 168 hours.  Chemistry Recent Labs  Lab 10/25/20 0447 10/26/20 0342 10/26/20 0750  10/27/20 0454  NA 136 136 138 135  K 4.1 4.5 4.2 4.3  CL 100 99 100 101  CO2 27 28  --  24  GLUCOSE 143* 164* 167* 124*  BUN 16 23 20 20   CREATININE 1.07* 1.27* 1.00 1.07*  CALCIUM 9.9 10.1  --  9.2  GFRNONAA 52* 42*  --  52*  ANIONGAP 9 9  --  10    Hematology Recent Labs  Lab 10/25/20 0447 10/26/20 0342 10/26/20 0750 10/27/20 0454  WBC 4.8 5.9  --  6.7  RBC 2.94* 2.98*  --  2.75*  HGB 9.1* 9.3* 8.8* 8.8*  HCT 28.2* 28.6* 26.0* 26.1*  MCV 95.9 96.0  --  94.9  MCH 31.0 31.2  --  32.0  MCHC 32.3 32.5  --  33.7  RDW 15.4 15.4  --  15.5  PLT 200 213  --  124*   BNPNo results for input(s): BNP, PROBNP in the last 168 hours.  DDimer No results for input(s): DDIMER in the last 168 hours.   Radiology  DG Chest 2 View  Result Date: 10/25/2020 CLINICAL DATA:  Preop. EXAM: CHEST - 2 VIEW COMPARISON:  Chest x-ray 10/18/2020. FINDINGS: Examination is limited secondary to patient rotation. The heart size and mediastinal contours are within normal limits. Both lungs are clear. Degenerative changes affect the spine. IMPRESSION: No active cardiopulmonary disease. Electronically Signed   By: 12/18/2020 M.D.   On: 10/25/2020 21:36   DG Chest Port 1 View  Result Date: 10/26/2020 CLINICAL DATA:  Status post TAVR EXAM: PORTABLE CHEST 1 VIEW COMPARISON:  10/25/2020 FINDINGS: Mild cardiomegaly. Interval placement of aortic valve stent endograft. Both lungs are clear. The visualized skeletal structures are unremarkable. IMPRESSION: Mild cardiomegaly. Interval placement of aortic valve stent endograft. No acute abnormality of the lungs. Electronically Signed   By: 10/27/2020 M.D.   On: 10/26/2020 11:45   ECHOCARDIOGRAM LIMITED  Result Date: 10/26/2020    ECHOCARDIOGRAM LIMITED REPORT   Patient Name:   Mary Mccarthy Date of Exam: 10/26/2020 Medical Rec #:  10/28/2020      Height:       61.0 in Accession #:    [id number]     Weight:       193.1 lb Date of Birth:  August 22, 1940      BSA:           1.861 m Patient Age:    80 years       BP:  150/75 mmHg Patient Gender: F              HR:           51 bpm. Exam Location:  Inpatient Procedure: Limited Echo, Color Doppler and Cardiac Doppler Indications:     Aortic Stenosis i35.0  History:         Patient has prior history of Echocardiogram examinations, most                  recent 08/10/2020. Arrythmias:Atrial Fibrillation; Risk                  Factors:Hypertension, Diabetes and Dyslipidemia.  Sonographer:     Irving Burton Senior RDCS Referring Phys:  [id number] Wille Celeste THOMPSON Diagnosing Phys: Charlton Haws MD  Sonographer Comments: 75mm Medtronic Evolut Pro Plus TAVR Implanted IMPRESSIONS  1. Left ventricular ejection fraction, by estimation, is 60 to 65%. The left ventricle has normal function.  2. Right ventricular systolic function is normal. The right ventricular size is normal.  3. Left atrial size was moderately dilated.  4. The mitral valve is degenerative. Mild mitral valve regurgitation. Severe mitral annular calcification.  5. Pre TAVR: poorly visualized AV severely calcified with restricted motion mean gradient 37 peak 60 mmHg AVA 0.72 cm2         Post TAVR: Well positioned 26 mm Medtronic supra annular Evolut Pro valve no PVL mean gradient 14 peak 24 mmHg AVR 1.58 cm2 Aortic root angiography post deployment showed normal flow to RCA and LM/LAD coronary arteries . The aortic valve has been repaired/replaced. FINDINGS  Left Ventricle: Left ventricular ejection fraction, by estimation, is 60 to 65%. The left ventricle has normal function. The left ventricular internal cavity size was normal in size. Right Ventricle: The right ventricular size is normal. No increase in right ventricular wall thickness. Right ventricular systolic function is normal. Left Atrium: Left atrial size was moderately dilated. Pericardium: There is no evidence of pericardial effusion. Mitral Valve: The mitral valve is degenerative in appearance. There is severe thickening of  the mitral valve leaflet(s). There is severe calcification of the mitral valve leaflet(s). Severe mitral annular calcification. Mild mitral valve regurgitation. Tricuspid Valve: The tricuspid valve is normal in structure. Tricuspid valve regurgitation is mild. Aortic Valve: Pre TAVR: poorly visualized AV severely calcified with restricted motion mean gradient 37 peak 60 mmHg AVA 0.72 cm2 Post TAVR: Well positioned 26 mm Medtronic supra annular Evolut Pro valve no PVL mean gradient 14 peak 24 mmHg AVR 1.58 cm2 Aortic root angiography post deployment showed normal flow to RCA and LM/LAD coronary arteries. The aortic valve has been repaired/replaced. Aortic valve mean gradient measures 12.0 mmHg. Aortic valve peak gradient measures 20.2 mmHg. Aortic valve area, by VTI measures 1.66 cm. LEFT VENTRICLE PLAX 2D LVOT diam:     2.00 cm LV SV:         68 LV SV Index:   36 LVOT Area:     3.14 cm  AORTIC VALVE AV Area (Vmax):    1.37 cm AV Area (Vmean):   1.37 cm AV Area (VTI):     1.66 cm AV Vmax:           224.50 cm/s AV Vmean:          166.000 cm/s AV VTI:            0.407 m AV Peak Grad:      20.2 mmHg AV Mean Grad:      12.0  mmHg LVOT Vmax:         97.90 cm/s LVOT Vmean:        72.200 cm/s LVOT VTI:          0.215 m LVOT/AV VTI ratio: 0.53  SHUNTS Systemic VTI:  0.22 m Systemic Diam: 2.00 cm Charlton Haws MD Electronically signed by Charlton Haws MD Signature Date/Time: 10/26/2020/10:55:34 AM    Final    Structural Heart Procedure  Result Date: 10/26/2020 See surgical note for result.   Cardiac Studies  TTE 10/26/2020  1. Left ventricular ejection fraction, by estimation, is 60 to 65%. The  left ventricle has normal function.   2. Right ventricular systolic function is normal. The right ventricular  size is normal.   3. Left atrial size was moderately dilated.   4. The mitral valve is degenerative. Mild mitral valve regurgitation.  Severe mitral annular calcification.   5. Pre TAVR: poorly visualized AV  severely calcified with restricted  motion mean gradient 37 peak 60 mmHg AVA 0.72 cm2   Patient Profile  Mary Mccarthy is a 80 y.o. female with obesity, diabetes, nonobstructive CAD, paroxysmal atrial fibrillation, stroke, diastolic heart failure, severe aortic stenosis who was admitted on 10/19/2020 with GI bleed secondary to gastritis.  Cardiology was consulted as she has severe aortic stenosis and TAVR is planned.  Course has been complicated by paroxysmal A. fib. S/p TAVR 10/26/2020 with 26 mm Evolut Pro.  Assessment & Plan   #Severe aortic stenosis #26 mm evolute pro 10/27/2020 #Transient high-grade AV block -Status post 26 mm evolute pro 10/26/2020 -Doing well.  Groin check shows no evidence of hematoma. -Did have transient high-grade block.  Pacing wires are in place.  She is not using them.  She is conducting normally.  We will check an EKG and discontinue pacing wires. -Anticipate Plavix and Eliquis.  #Paroxysmal atrial fibrillation #Sinus bradycardia -Transition to amiodarone 200 mg daily. -We will likely start Eliquis tomorrow.  We will discuss this with structural heart.  #HFpEF -Lasix 40 mg daily.  #Hypertension -Increase hydralazine 100 mg 3 times daily.  Amlodipine 10 mg daily.  #Acute blood loss anemia secondary to gastritis -Hemoglobin stable. -We will add back Eliquis at the discretion of structural heart.  #FEN -No intravenous fluids -Code: Full -DVT PPx: SCDs, restart Eliquis when able -Diet: Heart healthy  For questions or updates, please contact [facility] Please consult [email or web address] for contact info under   Time Spent with Patient: I have spent a total of 35 minutes with patient reviewing hospital notes, telemetry, EKGs, labs and examining the patient as well as establishing an assessment and plan that was discussed with the patient.  > 50% of time was spent in direct patient care.    Signed, Lenna Gilford. Flora Lipps, MD, [facility] [place]  [facility]   10/27/2020 7:28 AM

## 2020-10-27 NOTE — Progress Notes (Addendum)
  [facility]   MULTIDISCIPLINARY HEART VALVE TEAM   Temp wire pulled. Will start back on Eliquis tonight. No aspirin or plavix given recent GI bleed. Hg stable at 8.8. Transfer to 4E and watch on tele tomorrow. Echo completed and reviewed. Valve looks stable. Likely home tomorrow with a Zio patch.  Cline Crock PA-C  [facility]

## 2020-10-28 ENCOUNTER — Inpatient Hospital Stay: Payer: Medicare Other | Admitting: Adult Health

## 2020-10-28 ENCOUNTER — Inpatient Hospital Stay (HOSPITAL_COMMUNITY)
Admit: 2020-10-28 | Discharge: 2020-10-28 | Disposition: A | Discharge status: Home / Self Care | Payer: Medicare Other | Attending: Physician Assistant | Admitting: Physician Assistant

## 2020-10-28 DIAGNOSIS — I5033 Acute on chronic diastolic (congestive) heart failure: Secondary | ICD-10-CM | POA: Diagnosis not present

## 2020-10-28 DIAGNOSIS — I442 Atrioventricular block, complete: Secondary | ICD-10-CM

## 2020-10-28 DIAGNOSIS — I10 Essential (primary) hypertension: Secondary | ICD-10-CM | POA: Diagnosis not present

## 2020-10-28 DIAGNOSIS — I350 Nonrheumatic aortic (valve) stenosis: Secondary | ICD-10-CM

## 2020-10-28 DIAGNOSIS — D649 Anemia, unspecified: Secondary | ICD-10-CM | POA: Diagnosis not present

## 2020-10-28 DIAGNOSIS — Z952 Presence of prosthetic heart valve: Secondary | ICD-10-CM

## 2020-10-28 DIAGNOSIS — Z954 Presence of other heart-valve replacement: Secondary | ICD-10-CM

## 2020-10-28 LAB — CBC
HCT: 25.6 % — ABNORMAL LOW (ref 36.0–46.0)
Hemoglobin: 8.3 g/dL — ABNORMAL LOW (ref 12.0–15.0)
MCH: 31.1 pg (ref 26.0–34.0)
MCHC: 32.4 g/dL (ref 30.0–36.0)
MCV: 95.9 fL (ref 80.0–100.0)
Platelets: 124 10*3/uL — ABNORMAL LOW (ref 150–400)
RBC: 2.67 MIL/uL — ABNORMAL LOW (ref 3.87–5.11)
RDW: 15.5 % (ref 11.5–15.5)
WBC: 6.7 10*3/uL (ref 4.0–10.5)
nRBC: 0 % (ref 0.0–0.2)

## 2020-10-28 LAB — BASIC METABOLIC PANEL
Anion gap: 5 (ref 5–15)
BUN: 19 mg/dL (ref 8–23)
CO2: 26 mmol/L (ref 22–32)
Calcium: 9.3 mg/dL (ref 8.9–10.3)
Chloride: 102 mmol/L (ref 98–111)
Creatinine, Ser: 1.25 mg/dL — ABNORMAL HIGH (ref 0.44–1.00)
GFR, Estimated: 43 mL/min — ABNORMAL LOW (ref >60)
Glucose, Bld: 167 mg/dL — ABNORMAL HIGH (ref 70–99)
Potassium: 4.4 mmol/L (ref 3.5–5.1)
Sodium: 133 mmol/L — ABNORMAL LOW (ref 135–145)

## 2020-10-28 LAB — GLUCOSE, CAPILLARY: Glucose-Capillary: 171 mg/dL — ABNORMAL HIGH (ref 70–99)

## 2020-10-28 MED ORDER — AMLODIPINE BESYLATE 10 MG PO TABS: 10.0000 mg | ORAL_TABLET | Freq: Every day | ORAL | 3 refills | Status: DC

## 2020-10-28 MED ORDER — FUROSEMIDE 40 MG PO TABS: 40.0000 mg | ORAL_TABLET | Freq: Every day | ORAL | 1 refills | Status: DC

## 2020-10-28 MED ORDER — AMIODARONE HCL 200 MG PO TABS: 200.0000 mg | ORAL_TABLET | Freq: Every day | ORAL | 6 refills | Status: DC

## 2020-10-28 MED ORDER — PANTOPRAZOLE SODIUM 40 MG PO TBEC
40.0000 mg | DELAYED_RELEASE_TABLET | Freq: Two times a day (BID) | ORAL | 0 refills | Status: DC
Start: 2020-10-28 — End: 2020-12-20

## 2020-10-28 MED ORDER — HYDRALAZINE HCL 100 MG PO TABS: 100.0000 mg | ORAL_TABLET | Freq: Three times a day (TID) | ORAL | 3 refills | Status: DC

## 2020-10-28 MED ORDER — POTASSIUM CHLORIDE CRYS ER 20 MEQ PO TBCR: 20.0000 meq | EXTENDED_RELEASE_TABLET | Freq: Every day | ORAL | 1 refills | Status: DC

## 2020-10-28 MED FILL — Magnesium Sulfate Inj 50%: INTRAMUSCULAR | Qty: 10 | Status: AC

## 2020-10-28 MED FILL — Heparin Sodium (Porcine) Inj 1000 Unit/ML: Qty: 1000 | Status: AC

## 2020-10-28 MED FILL — Potassium Chloride Inj 2 mEq/ML: INTRAVENOUS | Qty: 40 | Status: AC

## 2020-10-28 NOTE — Progress Notes (Signed)
2 Days Post-Op Procedure(s) (LRB): TRANSCATHETER AORTIC VALVE REPLACEMENT, TRANSFEMORAL (N/A) INTRAOPERATIVE TRANSTHORACIC ECHOCARDIOGRAM (N/A) Subjective: No complaints. Ambulated well yesterday.   Monitor reviewed: sinus 80's with no heart block  Objective: Vital signs in last 24 hours: Temp:  [97.8 F (36.6 C)-97.9 F (36.6 C)] 97.9 F (36.6 C) (09/22 0322) Pulse Rate:  [57-72] 68 (09/22 0348) Cardiac Rhythm: Normal sinus rhythm (09/22 0500) Resp:  [16-22] 20 (09/22 0348) BP: (114-192)/(31-119) 155/48 (09/22 0348) SpO2:  [91 %-100 %] 95 % (09/22 0348) Arterial Line BP: (207)/(61) 207/61 (09/21 0800) Weight:  [89.5 kg] 89.5 kg (09/22 0322)  Hemodynamic parameters for last 24 hours:    Intake/Output from previous day: 09/21 0701 - 09/22 0700 In: 810 [P.O.:810] Out: 1100 [Urine:1100] Intake/Output this shift: No intake/output data recorded.  General appearance: alert and cooperative Neurologic: intact Heart: regular rate and rhythm, S1, S2 normal, no murmur  Lungs: clear to auscultation bilaterally Extremities: extremities normal Wound: groin sites ok  Lab Results: Recent Labs    10/27/20 0454 10/28/20 0132  WBC 6.7 6.7  HGB 8.8* 8.3*  HCT 26.1* 25.6*  PLT 124* 124*   BMET:  Recent Labs    10/27/20 0454 10/28/20 0132  NA 135 133*  K 4.3 4.4  CL 101 102  CO2 24 26  GLUCOSE 124* 167*  BUN 20 19  CREATININE 1.07* 1.25*  CALCIUM 9.2 9.3    PT/INR:  Recent Labs    10/25/20 2031  LABPROT 13.5  INR 1.0   ABG    Component Value Date/Time   PHART 7.446 10/26/2020 0503   HCO3 27.6 10/26/2020 0503   TCO2 28 10/26/2020 0750   O2SAT 96.8 10/26/2020 0503   CBG (last 3)  Recent Labs    10/27/20 1619 10/27/20 1947 10/28/20 0630  GLUCAP 165* 196* 171*   ECG yesterday : sinus 60, no heart block  2D echo: trival PVL, mean gradient 12  Assessment/Plan: S/P Procedure(s) (LRB): TRANSCATHETER AORTIC VALVE REPLACEMENT, TRANSFEMORAL  (N/A) INTRAOPERATIVE TRANSTHORACIC ECHOCARDIOGRAM (N/A)  She is doing well overall with stable sinus rhythm and no heart block. Echo looks fine. I think she can go home today.    LOS: 8 days    Alleen Borne 10/28/2020

## 2020-10-28 NOTE — Progress Notes (Signed)
CARDIAC REHAB PHASE I   PRE:  Rate/Rhythm: 79 SR  BP:  Supine:   Sitting: 136/36  Standing:    SaO2: 100%RA  MODE:  Ambulation: 160 ft   POST:  Rate/Rhythm: 101 ST  BP:  Supine:   Sitting: 183/48,159/54  Standing:    SaO2: 98%RA [id number] Pt had not walked yet. Pt walked 160 ft on RA with gait belt use, rolling walker and asst x 2. Tired easily. Pt stated she has family who will be available to walk with her at home and she has a walker. Not interested in CRP 2 at this time. Gave heart healthy diet for information. To recliner with call bell. Knows to call when she needs BSC.   Luetta Nutting, RN BSN  10/28/2020 8:49 AM

## 2020-10-28 NOTE — Progress Notes (Signed)
Patient d/c to home with son. Discharge instructions reviewed and patient verbalizes understanding. VS stable, patient in no acute distress. She left via private vehicle with son. Brynda Rim, RN

## 2020-10-28 NOTE — Discharge Summary (Addendum)
[place] VALVE TEAM  Discharge Summary    Patient ID: Mary Mccarthy MRN: [id number]; DOB: August 16, 1940  Admit date: 10/18/2020 Discharge date: 10/28/2020  Primary Care Provider: Biagio Borg, MD  Primary Cardiologist: Glenetta Hew, MD / Dr. Angelena Form & Dr. Cyndia Bent (TAVR)  Discharge Diagnoses    Principal Problem:   Symptomatic anemia Active Problems:   Type 2 diabetes mellitus with hyperglycemia, without long-term current use of insulin ([place])   Hyperlipidemia with target LDL less than 70   Morbid obesity ([place])   Essential hypertension   Chronic diastolic heart failure ([place])   AVM (arteriovenous malformation) of colon   GI bleed   Severe aortic stenosis   Coronary artery disease, non-occlusive   Anxiety   History of CVA (cerebrovascular accident)   Paroxysmal atrial fibrillation (HCC)   Melena   S/P TAVR (transcatheter aortic valve replacement)   CHB (complete heart block) (HCC)   Allergies Allergies  Allergen Reactions   Ibuprofen Other (See Comments)    Bleeding events    Diagnostic Studies/Procedures    EGD 10/21/20 Impression:               - Normal esophagus.                           - Gastritis. Biopsied.                           - Normal examined duodenum. Recommendation:           - Return patient to hospital ward for ongoing care.                           - It is suspected that the patient's anemia and                            melena may have been due to bleeding from gastric                            erosions (coupled with use of Eliquis) since                            stigmata of recent bleeding were seen on today's                            exam.                           - Await pathology results.                           - Use a proton pump inhibitor PO BID for 8 weeks,                            then decrease to QD                           - Okay to restart Eliquis tomorrow from a GI  standpoint                           - The findings and recommendations were discussed                            with the patient and/or primary team.     _____________       TAVR OPERATIVE NOTE  Date of Procedure:                 10/26/2020   Preoperative Diagnosis:      Severe Aortic Stenosis    Postoperative Diagnosis:    Same    Procedure:        Transcatheter Aortic Valve Replacement - Percutaneous Right Transfemoral Approach             Medtronic Evolut-Pro + (size 26 mm, model # EVPROPLUS -26US, serial # [id number])              Co-Surgeons:            Gaye Pollack, MD and Lauree Chandler, MD     Anesthesiologist:                  Gennie Alma, MD   Echocardiographer:              Edmonia James, MD   Pre-operative Echo Findings: Severe aortic stenosis and moderate AI  Normal left ventricular systolic function   Post-operative Echo Findings: No paravalvular leak Normal left ventricular systolic function   _____________    Echo 10/27/20:  IMPRESSIONS   1. Left ventricular ejection fraction, by estimation, is 60 to 65%. The  left ventricle has normal function. The left ventricle has no regional  wall motion abnormalities. There is mild left ventricular hypertrophy.  Left ventricular diastolic parameters  are indeterminate.   2. Right ventricular systolic function is normal. The right ventricular  size is normal.   3. Left atrial size was severely dilated.   4. Right atrial size was mildly dilated.   5. The mitral valve is degenerative. Mild mitral valve regurgitation.  Severe mitral annular calcification.   6. Post TAVR with 26 mm supra annular Medtronic Evolut Pro valve trivial  PvL mean gradient 12 peak 24 mmHg AVA 1.8 cm2 with DVI 0.63. The aortic  valve has been repaired/replaced. Aortic valve regurgitation is trivial.  Procedure Date: 10/26/2020.   History of Present Illness     Mary Mccarthy is a 80 y.o. female with a history of  obesity, DMT2, non obstructive CAD, PUD/AVM, PAF on amio and Eliquis (initially diagnosed at the time of acute CVA in 08/2020), HTN, pulmonary HTN, chronic diastolic CHF, and severe AS with plans for TAVR who presented to [facility] ED on 10/19/20 for evaluation of dyspnea and found to have acute anemia with GI bleeding.   She was admitted in 2017 with urosepsis, AKI and acute anemia with a hemoglobin of 5 requiring transfusion.  She underwent EGD and colonoscopy which showed several areas of potential GI bleeding but no current bleeding.  She was found to have AVMs and peptic ulcer disease.   She was admitted in July 2022 with a stroke and diagnosed with new atrial fibrillation at that time.  MRI of the brain showed small acute infarct diffusion bilaterally. She was started on Eliquis.  Echo at that time showed EF 55 to 60%, moderately elevated pulmonary hypertension,  severe concentric LVH, severe mitral annular calcification and severe aortic stenosis with a mean gradient of 42 mmHg, peak gradient of 76 mmHg, AVA of 0.73 cm, DVI 0.27.    She was then readmitted in August 2022 with acute on chronic diastolic CHF in the setting of severe aortic stenosis.  She was diuresed with IV Lasix.  She also had atrial fibrillation with RVR that converted to sinus with IV amiodarone.  Cardiology was consulted and she was seen by Dr. Burt Knack with the structural heart team.  She underwent left and right heart cath which showed nonobstructive CAD with severe aortic stenosis with a mean gradient of 53 mmHg.  She also underwent pre-TAVR CT studies. Cardiac gated CTA of the heart revealed anatomical characteristics consistent with aortic stenosis suitable for treatment by transcatheter aortic valve replacement without any significant complicating features and CTA of the aorta and iliac vessels demonstrated what appear to be adequate pelvic vascular access to facilitate a transfemoral approach.    She was scheduled to see Dr. Cyndia Bent in  the office on 10/20/2020 as a part of the multidisciplinary valve team approach.  However, she was readmitted to the hospital on 10/19/20 for acute dyspnea and black tarry stools and found to have GI bleeding.   Hospital Course     Consultants: GI, internal medicine    Acute blood loss anemia 2/2 GI bleeding:  Hg 7.4 on admission. Transfused with 1 unit PRBCs. FOBT +. EDG 9/15 showed gastritis with no signs of active bleeding but stigmata of recent bleeding.  It was suspected that the patient's anemia and melena was due to bleeding from gastric erosions in the setting of Eliquis use. Colonoscopy was deferred given severe AS and risk with sedation.  -- Cleared by GI to resume Eliquis.  -- Recommended Protonix po BID for 8 weeks, then decrease to QD. -- Hg stable ~ 8.3. Will follow closely in outpatient setting.   Atrial fibrillation with RVR: pt initially noted to be in afib with RVR. She was started on amio IV bolus/infusion and converted to NSR. She was loaded with PO amio and now on amio 200mg  daily which will be continued. Eliquis resumed 9/21 PM. Watchman could be a future consideration if she continues to have issues with GI bleeding.   Severe AS: s/p successful TAVR with a 26 mm Medtronic Evolut Pro + THV via the TF approach on 10/27/20. Post operative echo showed EF 60%, normally functioning TAVR with a mean gradient of 12 mmHg and trivial PVL. Groin sites are stable. Continue Eliquis without concurrent antiplatelet therapy given recent GI bleeding.   Complete heart block: pt developed CHB intraoperatively and temp wire was left in place over night. She regained her previous intrinsic rhythm of sinus brady in 50s and temp wire pulled. Telemetry as been stable. Her home Coreg has been discontinued. Will have a Zio AT applied prior to discharge to rule out recurrence of HAVB.   HTN: BP currently elevated. Started on Norvasc 10mg  daily, hydralazine 100mg  TID, Lasix 40 mg daily. These will be Rx'd  at discharge. Home Coreg discontinued in the setting of bradycardia. Will follow BP as an outpatient and adjust as indicated.   Chronic diastolic CHF: appears euvolemic. Continue Lasix 40mg / Kdur 73meq daily (increased from home dose of 20mg  daily). Creat a little more elevated today at 1.25. Will get follow up labs next week in office.   DMT2: treated with SSI while admitted. Resume home meds at discharge. Okay to resume  Metformin tonight as it has been 48 hours after contrast dye exposure  Groin candidal yeast infections: this resolved with diflucan and topical mycostatin.   Pulmonary nodule: pre TAVR CT showed a 5 mm pulmonary nodule in the lateral segment of the right middle lobe. No follow-up needed if patient is low-risk. Non-contrast chest CT can be considered in 12 months if patient is high-risk. This will be discussed in the outpatient setting _____________  Discharge Vitals Blood pressure (!) 136/36, pulse 79, temperature 98.5 F (36.9 C), temperature source Oral, resp. rate 16, height $RemoveBe'5\' 1"'VMhBvIWaV$  (1.549 m), weight 89.5 kg, SpO2 100 %.  Filed Weights   10/25/20 1000 10/26/20 0505 10/28/20 0322  Weight: 88.4 kg 87.6 kg 89.5 kg    GEN: Well nourished, well developed, in no acute distress HEENT: normal Neck: no JVD or masses Cardiac: RRR; soft flow murmur. No rubs, or gallops,no edema  Respiratory:  clear to auscultation bilaterally, normal work of breathing GI: soft, nontender, nondistended, + BS MS: no deformity or atrophy Skin: warm and dry, no rash  Groin sites clear without hematoma or ecchymosis  Neuro:  Alert and Oriented x 3, Strength and sensation are intact Psych: euthymic mood, full affect   Labs & Radiologic Studies    CBC Recent Labs    10/27/20 0454 10/28/20 0132  WBC 6.7 6.7  HGB 8.8* 8.3*  HCT 26.1* 25.6*  MCV 94.9 95.9  PLT 124* 161*   Basic Metabolic Panel Recent Labs    10/27/20 0454 10/28/20 0132  NA 135 133*  K 4.3 4.4  CL 101 102  CO2 24 26   GLUCOSE 124* 167*  BUN 20 19  CREATININE 1.07* 1.25*  CALCIUM 9.2 9.3  MG 1.7  --    Liver Function Tests No results for input(s): AST, ALT, ALKPHOS, BILITOT, PROT, ALBUMIN in the last 72 hours. No results for input(s): LIPASE, AMYLASE in the last 72 hours. Cardiac Enzymes No results for input(s): CKTOTAL, CKMB, CKMBINDEX, TROPONINI in the last 72 hours. BNP Invalid input(s): POCBNP D-Dimer No results for input(s): DDIMER in the last 72 hours. Hemoglobin A1C Recent Labs    10/26/20 0342  HGBA1C 5.8*   Fasting Lipid Panel No results for input(s): CHOL, HDL, LDLCALC, TRIG, CHOLHDL, LDLDIRECT in the last 72 hours. Thyroid Function Tests No results for input(s): TSH, T4TOTAL, T3FREE, THYROIDAB in the last 72 hours.  Invalid input(s): FREET3 _____________  DG Chest 2 View  Result Date: 10/25/2020 CLINICAL DATA:  Preop. EXAM: CHEST - 2 VIEW COMPARISON:  Chest x-ray 10/18/2020. FINDINGS: Examination is limited secondary to patient rotation. The heart size and mediastinal contours are within normal limits. Both lungs are clear. Degenerative changes affect the spine. IMPRESSION: No active cardiopulmonary disease. Electronically Signed   By: Ronney Asters M.D.   On: 10/25/2020 21:36   DG Chest 2 View  Result Date: 10/18/2020 CLINICAL DATA:  Dyspnea. EXAM: CHEST - 2 VIEW COMPARISON:  Chest x-ray 10/06/2020. FINDINGS: There are some patchy opacities in the right middle lobe/right infrahilar region. The lungs otherwise appear clear. There is no pleural effusion or pneumothorax. The heart is mildly enlarged, unchanged. Mediastinal silhouette is within normal limits. No acute fractures are seen. There are degenerative changes of the thoracic spine. IMPRESSION: 1. Minimal right middle lobe/infrahilar atelectasis/airspace disease. 2. Recommend follow-up PA and lateral chest x-ray and 4-6 weeks to confirm resolution. Electronically Signed   By: Ronney Asters M.D.   On: 10/18/2020 19:54   DG Chest  2 View  Result Date: 10/07/2020 CLINICAL DATA:  Anxiety, dyspnea on exertion, CHF EXAM: CHEST - 2 VIEW COMPARISON:  09/27/2020 FINDINGS: Stable cardiomegaly with minor basilar atelectasis. No focal pneumonia, collapse or consolidation. Negative for edema, large effusion. Trachea midline. Aorta atherosclerotic. Degenerative changes of the spine. IMPRESSION: Stable cardiomegaly and basilar atelectasis. No interval change or acute process by plain radiography Aortic Atherosclerosis (ICD10-I70.0). Electronically Signed   By: Jerilynn Mages.  Shick M.D.   On: 10/07/2020 16:11   CARDIAC CATHETERIZATION  Result Date: 09/29/2020   Prox RCA lesion is 20% stenosed.   Mid RCA to Dist RCA lesion is 20% stenosed.   Ramus lesion is 30% stenosed.   Mid LAD lesion is 20% stenosed.   Dist LM to Prox LAD lesion is 20% stenosed. Mild non-obstructive CAD Severe aortic stenosis (mean gradient 52.8 mmHg, peak to peak gradient 58 mmHg, AVA 0.90 cm2). Recommendations: Will continue workup for TAVR   CT CORONARY MORPH W/CTA COR W/SCORE W/CA W/CM &/OR WO/CM  Addendum Date: 09/29/2020   ADDENDUM REPORT: 09/29/2020 13:14 CLINICAL DATA:  Pre-op transcatheter aortic valve replacement (TAVR) EXAM: Cardiac TAVR CT TECHNIQUE: The patient was scanned on a Siemens Force 081 slice scanner. A 120 kV retrospective scan was triggered in the descending thoracic aorta at 111 HU's. Gantry rotation speed was 270 msecs and collimation was .9 mm. The 3D data set was reconstructed in 5% intervals of the R-R cycle. Systolic and diastolic phases were analyzed on a dedicated work station using MPR, MIP and VRT modes. The patient received 6mL OMNIPAQUE IOHEXOL 350 MG/ML SOLN of contrast. FINDINGS: Aortic Valve: Tricuspid aortic valve. Severely reduced cusp separation. Severely thickened, moderately calcified aortic valve cusps. AV calcium score: 1049 Virtual Basal Annulus Measurements: Maximum/Minimum Diameter: 23.1 mm x 18.9 mm Perimeter: 65.4 mm Area: 328 mm2 No  significant LVOT calcifications. Based on these measurements, the annulus would be suitable for a 20 mm Edwards Sapien 3 valve vs 23 mm Medtronic Evolut Pro valve. Sinus dimensions are borderline for 26 mm Medtronic Evolut Pro valve. Recommend Heart Team discussion for valve sizing. Sinus of Valsalva Measurements: Non-coronary:  26 mm Right - coronary:  26 mm Left - coronary:  27 mm Sinus of Valsalva Height: Left: 17.5 mm Right: 18.6 mm Aorta: Mild aortic atherosclerosis. Sinotubular Junction: 27 mm Ascending Thoracic Aorta:  31 mm Aortic Arch:  24 mm Descending Thoracic Aorta:  22 mm Coronary Artery Height above Annulus: Left Main: 13 mm Right Coronary: 11.6 mm Coronary Arteries: Normal origins. 3 vessel coronary artery calcifications. Optimum Fluoroscopic Angle for Delivery: RAO 1, CRA 1 Moderate mitral annular calcification. No left atrial appendage thrombus. IMPRESSION: 1. Tricuspid aortic valve. Severely reduced cusp separation. Severely thickened, moderately calcified aortic valve cusps. 2.  AV calcium score: 1049 3. Annulus area: 328 mm2, no significant LVOT calcifications. Based on these measurements, the annulus would be suitable for a 20 mm Edwards Sapien 3 valve vs 23 mm Medtronic Evolut Pro valve. Sinus dimensions are borderline for 26 mm Medtronic Evolut Pro valve. Recommend Heart Team discussion for valve sizing. 4.  Sufficient coronary artery height from annulus. 5.  Optimum Fluoroscopic Angle for Delivery: RAO 1, CRA 1 Electronically Signed   By: Cherlynn Kaiser M.D.   On: 09/29/2020 13:14   Result Date: 09/29/2020 EXAM: OVER-READ INTERPRETATION  CT CHEST The following report is an over-read performed by radiologist Dr. Vinnie Langton of [facility], [place] on 09/29/2020. This over-read does not include interpretation of cardiac or coronary anatomy or pathology. The coronary  calcium score/coronary CTA interpretation by the cardiologist is attached. COMPARISON:  Cardiac CTA 10/09/2019.  FINDINGS: Extracardiac findings will be described separately under dictation for contemporaneously obtained CTA chest, abdomen and pelvis. IMPRESSION: Please see separate dictation for contemporaneously obtained CTA chest, abdomen and pelvis dated 09/28/2020 for full description of relevant extracardiac findings. Electronically Signed: By: Vinnie Langton M.D. On: 09/29/2020 06:07   DG Chest Port 1 View  Result Date: 10/26/2020 CLINICAL DATA:  Status post TAVR EXAM: PORTABLE CHEST 1 VIEW COMPARISON:  10/25/2020 FINDINGS: Mild cardiomegaly. Interval placement of aortic valve stent endograft. Both lungs are clear. The visualized skeletal structures are unremarkable. IMPRESSION: Mild cardiomegaly. Interval placement of aortic valve stent endograft. No acute abnormality of the lungs. Electronically Signed   By: Eddie Candle M.D.   On: 10/26/2020 11:45   CT ANGIO CHEST AORTA W/CM & OR WO/CM  Result Date: 09/29/2020 CLINICAL DATA:  80 year old female with history of severe aortic stenosis. Preprocedural study prior to potential transcatheter aortic valve replacement (TAVR). EXAM: CT ANGIOGRAPHY CHEST, ABDOMEN AND PELVIS TECHNIQUE: Multidetector CT imaging through the chest, abdomen and pelvis was performed using the standard protocol during bolus administration of intravenous contrast. Multiplanar reconstructed images and MIPs were obtained and reviewed to evaluate the vascular anatomy. CONTRAST:  36mL OMNIPAQUE IOHEXOL 350 MG/ML SOLN COMPARISON:  Cardiac CT 10/09/2019. FINDINGS: CTA CHEST FINDINGS Cardiovascular: Heart size is normal. There is no significant pericardial fluid, thickening or pericardial calcification. There is aortic atherosclerosis, as well as atherosclerosis of the great vessels of the mediastinum and the coronary arteries, including calcified atherosclerotic plaque in the left main, left anterior descending and right coronary arteries. Severe thickening calcification of the aortic valve.  Severe calcifications of the mitral annulus. Mediastinum/Lymph Nodes: No pathologically enlarged mediastinal or hilar lymph nodes. Esophagus is unremarkable in appearance. No axillary lymphadenopathy. Lungs/Pleura: Trace bilateral pleural effusions lying dependently (right greater than left). Associated areas of passive subsegmental atelectasis in the lower lobes of the lungs bilaterally. No acute consolidative airspace disease. 5 mm pulmonary nodule in the lateral segment of the right middle lobe (axial image 61 of series 7). Musculoskeletal/Soft Tissues: There are no aggressive appearing lytic or blastic lesions noted in the visualized portions of the skeleton. CTA ABDOMEN AND PELVIS FINDINGS Hepatobiliary: No suspicious cystic or solid hepatic lesions. No intra or extrahepatic biliary ductal dilatation. Gallbladder is normal in appearance. Pancreas: No pancreatic mass. No pancreatic ductal dilatation. No pancreatic or peripancreatic fluid collections or inflammatory changes. Spleen: Unremarkable. Adrenals/Urinary Tract: Bilateral kidneys and adrenal glands are normal in appearance. No hydroureteronephrosis. Urinary bladder is normal in appearance. Stomach/Bowel: The appearance of the stomach is normal. No pathologic dilatation of small bowel or colon. The appendix is not confidently identified and may be surgically absent. Regardless, there are no inflammatory changes noted adjacent to the cecum to suggest the presence of an acute appendicitis at this time. Vascular/Lymphatic: Aortic atherosclerosis, without evidence of aneurysm or dissection in the abdominal or pelvic vasculature. Vascular findings and measurements pertinent to potential TAVR procedure, as detailed below. No lymphadenopathy noted in the abdomen or pelvis. Reproductive: Uterus and ovaries are trophic. Other: No significant volume of ascites.  No pneumoperitoneum. Musculoskeletal: There are no aggressive appearing lytic or blastic lesions noted in  the visualized portions of the skeleton. VASCULAR MEASUREMENTS PERTINENT TO TAVR: AORTA: Minimal Aortic Diameter-14 x 15 mm Severity of Aortic Calcification-mild RIGHT PELVIS: Right Common Iliac Artery - Minimal Diameter-8.8 x 9.1 mm Tortuosity-mild Calcification-minimal Right External Iliac Artery - Minimal  Diameter-6.9 x 6.5 mm Tortuosity-moderate Calcification-none Right Common Femoral Artery - Minimal Diameter-7.1 x 6.9 mm Tortuosity-mild Calcification-minimal LEFT PELVIS: Left Common Iliac Artery - Minimal Diameter-9.6 x 8.8 mm Tortuosity-mild Calcification-none Left External Iliac Artery - Minimal Diameter-6.6 x 7.0 mm Tortuosity-moderate Calcification-none Left Common Femoral Artery - Minimal Diameter-6.7 x 6.8 mm Tortuosity-mild Calcification-minimal Review of the MIP images confirms the above findings. IMPRESSION: 1. Vascular findings and measurements pertinent to potential TAVR procedure, as detailed above. 2. Severe thickening calcification of the aortic valve, compatible with reported clinical history of severe aortic stenosis. 3. 5 mm pulmonary nodule in the lateral segment of the right middle lobe. No follow-up needed if patient is low-risk. Non-contrast chest CT can be considered in 12 months if patient is high-risk. This recommendation follows the consensus statement: Guidelines for Management of Incidental Pulmonary Nodules Detected on CT Images: From the Fleischner Society 2017; Radiology 2017; 284:228-243. 4. Trace bilateral pleural effusions (right greater than left) with areas of passive subsegmental atelectasis in the lower lobes of the lungs bilaterally. 5. Mild cardiomegaly. 6. Aortic atherosclerosis, in addition to left main and 3 vessel coronary artery disease. 7. Severe calcifications of the mitral annulus. 8. Additional incidental findings, as above. Electronically Signed   By: Vinnie Langton M.D.   On: 09/29/2020 10:05   ECHOCARDIOGRAM COMPLETE  Result Date: 10/27/2020     ECHOCARDIOGRAM REPORT   Patient Name:   CHANDRIKA SANDLES Date of Exam: 10/27/2020 Medical Rec #:  [id number]      Height:       61.0 in Accession #:    [id number]     Weight:       193.1 lb Date of Birth:  03-02-1940      BSA:          1.861 m Patient Age:    80 years       BP:           164/45 mmHg Patient Gender: F              HR:           56 bpm. Exam Location:  Inpatient Procedure: 2D Echo, 3D Echo, Cardiac Doppler, Color Doppler and Intracardiac            Opacification Agent Indications:    Post TAVR evaluation V43.3 / Z95.2  History:        Patient has prior history of Echocardiogram examinations, most                 recent 10/26/2020. CHF, Stroke; Risk Factors:Hypertension.                 Bradycardia S/P TAVR implantation. History of Paroxysmal atrial                 fibrillation. Acute blood loss anemia secondary to gastritis.  Sonographer:    Darlina Sicilian RDCS Referring Phys: [id number]  1. Left ventricular ejection fraction, by estimation, is 60 to 65%. The left ventricle has normal function. The left ventricle has no regional wall motion abnormalities. There is mild left ventricular hypertrophy. Left ventricular diastolic parameters are indeterminate.  2. Right ventricular systolic function is normal. The right ventricular size is normal.  3. Left atrial size was severely dilated.  4. Right atrial size was mildly dilated.  5. The mitral valve is degenerative. Mild mitral valve regurgitation. Severe mitral annular calcification.  6. Post TAVR with 26 mm supra annular Medtronic Evolut Pro valve  trivial PvL mean gradient 12 peak 24 mmHg AVA 1.8 cm2 with DVI 0.63. The aortic valve has been repaired/replaced. Aortic valve regurgitation is trivial. Procedure Date: 10/26/2020. FINDINGS  Left Ventricle: Left ventricular ejection fraction, by estimation, is 60 to 65%. The left ventricle has normal function. The left ventricle has no regional wall motion abnormalities. Definity  contrast agent was given IV to delineate the left ventricular  endocardial borders. The left ventricular internal cavity size was normal in size. There is mild left ventricular hypertrophy. Left ventricular diastolic parameters are indeterminate. Right Ventricle: The right ventricular size is normal. No increase in right ventricular wall thickness. Right ventricular systolic function is normal. Left Atrium: Left atrial size was severely dilated. Right Atrium: Right atrial size was mildly dilated. Pericardium: There is no evidence of pericardial effusion. Mitral Valve: The mitral valve is degenerative in appearance. There is moderate thickening of the mitral valve leaflet(s). There is moderate calcification of the mitral valve leaflet(s). Severe mitral annular calcification. Mild mitral valve regurgitation. Tricuspid Valve: The tricuspid valve is normal in structure. Tricuspid valve regurgitation is mild. Aortic Valve: Post TAVR with 26 mm supra annular Medtronic Evolut Pro valve trivial PvL mean gradient 12 peak 24 mmHg AVA 1.8 cm2 with DVI 0.63. The aortic valve has been repaired/replaced. Aortic valve regurgitation is trivial. Aortic valve mean gradient measures 12.0 mmHg. Aortic valve peak gradient measures 24.1 mmHg. Aortic valve area, by VTI measures 1.79 cm. There is a 26 mm CoreValve-Evolut Pro prosthetic, stented (TAVR) valve present in the aortic position. Pulmonic Valve: The pulmonic valve was normal in structure. Pulmonic valve regurgitation is mild. Aorta: The aortic root is normal in size and structure. IAS/Shunts: No atrial level shunt detected by color flow Doppler.  LEFT VENTRICLE PLAX 2D LVIDd:         4.60 cm      Diastology LVIDs:         3.00 cm      LV e' medial:    3.30 cm/s LV PW:         1.00 cm      LV E/e' medial:  30.9 LV IVS:        1.30 cm      LV e' lateral:   2.93 cm/s LVOT diam:     1.90 cm      LV E/e' lateral: 34.8 LV SV:         88 LV SV Index:   47 LVOT Area:     2.84 cm  LV  Volumes (MOD) LV vol d, MOD A2C: 113.0 ml LV vol d, MOD A4C: 130.0 ml LV vol s, MOD A2C: 23.2 ml LV vol s, MOD A4C: 37.4 ml LV SV MOD A2C:     89.8 ml LV SV MOD A4C:     130.0 ml LV SV MOD BP:      93.2 ml LEFT ATRIUM             Index LA diam:        5.30 cm 2.85 cm/m LA Vol (A2C):   47.4 ml 25.47 ml/m LA Vol (A4C):   36.0 ml 19.35 ml/m LA Biplane Vol: 43.3 ml 23.27 ml/m  AORTIC VALVE AV Area (Vmax):    1.66 cm AV Area (Vmean):   1.70 cm AV Area (VTI):     1.79 cm AV Vmax:           245.33 cm/s AV Vmean:  155.000 cm/s AV VTI:            0.490 m AV Peak Grad:      24.1 mmHg AV Mean Grad:      12.0 mmHg LVOT Vmax:         144.00 cm/s LVOT Vmean:        92.800 cm/s LVOT VTI:          0.309 m LVOT/AV VTI ratio: 0.63 MITRAL VALVE MV Area (PHT): 2.80 cm     SHUNTS MV Decel Time: 271 msec     Systemic VTI:  0.31 m MV E velocity: 102.00 cm/s  Systemic Diam: 1.90 cm MV A velocity: 105.00 cm/s MV E/A ratio:  0.97 Jenkins Rouge MD Electronically signed by Jenkins Rouge MD Signature Date/Time: 10/27/2020/6:26:40 PM    Final    ECHOCARDIOGRAM LIMITED  Result Date: 10/26/2020    ECHOCARDIOGRAM LIMITED REPORT   Patient Name:   MARIHA SLEEPER Date of Exam: 10/26/2020 Medical Rec #:  [id number]      Height:       61.0 in Accession #:    [id number]     Weight:       193.1 lb Date of Birth:  [birth year]      BSA:          1.861 m Patient Age:    34 years       BP:           150/75 mmHg Patient Gender: F              HR:           51 bpm. Exam Location:  Inpatient Procedure: Limited Echo, Color Doppler and Cardiac Doppler Indications:     Aortic Stenosis i35.0  History:         Patient has prior history of Echocardiogram examinations, most                  recent 08/10/2020. Arrythmias:Atrial Fibrillation; Risk                  Factors:Hypertension, Diabetes and Dyslipidemia.  Sonographer:     Raquel Sarna Senior RDCS Referring Phys:  [id number] Woodfin Ganja Jaiona Simien Diagnosing Phys: Jenkins Rouge MD  Sonographer Comments: 30mm  Medtronic Evolut Pro Plus TAVR Implanted IMPRESSIONS  1. Left ventricular ejection fraction, by estimation, is 60 to 65%. The left ventricle has normal function.  2. Right ventricular systolic function is normal. The right ventricular size is normal.  3. Left atrial size was moderately dilated.  4. The mitral valve is degenerative. Mild mitral valve regurgitation. Severe mitral annular calcification.  5. Pre TAVR: poorly visualized AV severely calcified with restricted motion mean gradient 37 peak 60 mmHg AVA 0.72 cm2         Post TAVR: Well positioned 26 mm Medtronic supra annular Evolut Pro valve no PVL mean gradient 14 peak 24 mmHg AVR 1.58 cm2 Aortic root angiography post deployment showed normal flow to RCA and LM/LAD coronary arteries . The aortic valve has been repaired/replaced. FINDINGS  Left Ventricle: Left ventricular ejection fraction, by estimation, is 60 to 65%. The left ventricle has normal function. The left ventricular internal cavity size was normal in size. Right Ventricle: The right ventricular size is normal. No increase in right ventricular wall thickness. Right ventricular systolic function is normal. Left Atrium: Left atrial size was moderately dilated. Pericardium: There is no evidence of pericardial effusion. Mitral Valve: The mitral valve is degenerative in appearance.  There is severe thickening of the mitral valve leaflet(s). There is severe calcification of the mitral valve leaflet(s). Severe mitral annular calcification. Mild mitral valve regurgitation. Tricuspid Valve: The tricuspid valve is normal in structure. Tricuspid valve regurgitation is mild. Aortic Valve: Pre TAVR: poorly visualized AV severely calcified with restricted motion mean gradient 37 peak 60 mmHg AVA 0.72 cm2 Post TAVR: Well positioned 26 mm Medtronic supra annular Evolut Pro valve no PVL mean gradient 14 peak 24 mmHg AVR 1.58 cm2 Aortic root angiography post deployment showed normal flow to RCA and LM/LAD coronary  arteries. The aortic valve has been repaired/replaced. Aortic valve mean gradient measures 12.0 mmHg. Aortic valve peak gradient measures 20.2 mmHg. Aortic valve area, by VTI measures 1.66 cm. LEFT VENTRICLE PLAX 2D LVOT diam:     2.00 cm LV SV:         68 LV SV Index:   36 LVOT Area:     3.14 cm  AORTIC VALVE AV Area (Vmax):    1.37 cm AV Area (Vmean):   1.37 cm AV Area (VTI):     1.66 cm AV Vmax:           224.50 cm/s AV Vmean:          166.000 cm/s AV VTI:            0.407 m AV Peak Grad:      20.2 mmHg AV Mean Grad:      12.0 mmHg LVOT Vmax:         97.90 cm/s LVOT Vmean:        72.200 cm/s LVOT VTI:          0.215 m LVOT/AV VTI ratio: 0.53  SHUNTS Systemic VTI:  0.22 m Systemic Diam: 2.00 cm Jenkins Rouge MD Electronically signed by Jenkins Rouge MD Signature Date/Time: 10/26/2020/10:55:34 AM    Final    Structural Heart Procedure  Result Date: 10/26/2020 See surgical note for result.  CT Angio Abd/Pel w/ and/or w/o  Result Date: 09/29/2020 CLINICAL DATA:  80 year old female with history of severe aortic stenosis. Preprocedural study prior to potential transcatheter aortic valve replacement (TAVR). EXAM: CT ANGIOGRAPHY CHEST, ABDOMEN AND PELVIS TECHNIQUE: Multidetector CT imaging through the chest, abdomen and pelvis was performed using the standard protocol during bolus administration of intravenous contrast. Multiplanar reconstructed images and MIPs were obtained and reviewed to evaluate the vascular anatomy. CONTRAST:  25mL OMNIPAQUE IOHEXOL 350 MG/ML SOLN COMPARISON:  Cardiac CT 10/09/2019. FINDINGS: CTA CHEST FINDINGS Cardiovascular: Heart size is normal. There is no significant pericardial fluid, thickening or pericardial calcification. There is aortic atherosclerosis, as well as atherosclerosis of the great vessels of the mediastinum and the coronary arteries, including calcified atherosclerotic plaque in the left main, left anterior descending and right coronary arteries. Severe thickening  calcification of the aortic valve. Severe calcifications of the mitral annulus. Mediastinum/Lymph Nodes: No pathologically enlarged mediastinal or hilar lymph nodes. Esophagus is unremarkable in appearance. No axillary lymphadenopathy. Lungs/Pleura: Trace bilateral pleural effusions lying dependently (right greater than left). Associated areas of passive subsegmental atelectasis in the lower lobes of the lungs bilaterally. No acute consolidative airspace disease. 5 mm pulmonary nodule in the lateral segment of the right middle lobe (axial image 61 of series 7). Musculoskeletal/Soft Tissues: There are no aggressive appearing lytic or blastic lesions noted in the visualized portions of the skeleton. CTA ABDOMEN AND PELVIS FINDINGS Hepatobiliary: No suspicious cystic or solid hepatic lesions. No intra or extrahepatic biliary ductal dilatation. Gallbladder is normal  in appearance. Pancreas: No pancreatic mass. No pancreatic ductal dilatation. No pancreatic or peripancreatic fluid collections or inflammatory changes. Spleen: Unremarkable. Adrenals/Urinary Tract: Bilateral kidneys and adrenal glands are normal in appearance. No hydroureteronephrosis. Urinary bladder is normal in appearance. Stomach/Bowel: The appearance of the stomach is normal. No pathologic dilatation of small bowel or colon. The appendix is not confidently identified and may be surgically absent. Regardless, there are no inflammatory changes noted adjacent to the cecum to suggest the presence of an acute appendicitis at this time. Vascular/Lymphatic: Aortic atherosclerosis, without evidence of aneurysm or dissection in the abdominal or pelvic vasculature. Vascular findings and measurements pertinent to potential TAVR procedure, as detailed below. No lymphadenopathy noted in the abdomen or pelvis. Reproductive: Uterus and ovaries are trophic. Other: No significant volume of ascites.  No pneumoperitoneum. Musculoskeletal: There are no aggressive  appearing lytic or blastic lesions noted in the visualized portions of the skeleton. VASCULAR MEASUREMENTS PERTINENT TO TAVR: AORTA: Minimal Aortic Diameter-14 x 15 mm Severity of Aortic Calcification-mild RIGHT PELVIS: Right Common Iliac Artery - Minimal Diameter-8.8 x 9.1 mm Tortuosity-mild Calcification-minimal Right External Iliac Artery - Minimal Diameter-6.9 x 6.5 mm Tortuosity-moderate Calcification-none Right Common Femoral Artery - Minimal Diameter-7.1 x 6.9 mm Tortuosity-mild Calcification-minimal LEFT PELVIS: Left Common Iliac Artery - Minimal Diameter-9.6 x 8.8 mm Tortuosity-mild Calcification-none Left External Iliac Artery - Minimal Diameter-6.6 x 7.0 mm Tortuosity-moderate Calcification-none Left Common Femoral Artery - Minimal Diameter-6.7 x 6.8 mm Tortuosity-mild Calcification-minimal Review of the MIP images confirms the above findings. IMPRESSION: 1. Vascular findings and measurements pertinent to potential TAVR procedure, as detailed above. 2. Severe thickening calcification of the aortic valve, compatible with reported clinical history of severe aortic stenosis. 3. 5 mm pulmonary nodule in the lateral segment of the right middle lobe. No follow-up needed if patient is low-risk. Non-contrast chest CT can be considered in 12 months if patient is high-risk. This recommendation follows the consensus statement: Guidelines for Management of Incidental Pulmonary Nodules Detected on CT Images: From the Fleischner Society 2017; Radiology 2017; 284:228-243. 4. Trace bilateral pleural effusions (right greater than left) with areas of passive subsegmental atelectasis in the lower lobes of the lungs bilaterally. 5. Mild cardiomegaly. 6. Aortic atherosclerosis, in addition to left main and 3 vessel coronary artery disease. 7. Severe calcifications of the mitral annulus. 8. Additional incidental findings, as above. Electronically Signed   By: Vinnie Langton M.D.   On: 09/29/2020 10:05   Disposition   Pt  is being discharged home today in good condition.  Follow-up Plans & Appointments     Follow-up Information     Eileen Stanford, PA-C. Go on 11/04/2020.   Specialties: Cardiology, Radiology Why: @ 1:30pm, please arrive at least 10 minutes early Contact information: [address] [place] [address] [phone]                  Discharge Medications   Allergies as of 10/28/2020       Reactions   Ibuprofen Other (See Comments)   Bleeding events        Medication List     STOP taking these medications    Aspirin Low Dose 81 MG EC tablet Generic drug: aspirin   carvedilol 6.25 MG tablet Commonly known as: COREG       TAKE these medications    acetaminophen 500 MG tablet Commonly known as: TYLENOL Take 500 mg by mouth every 6 (six) hours as needed for moderate pain.   ALPRAZolam 0.25  MG tablet Commonly known as: XANAX Take 1 tablet (0.25 mg total) by mouth 3 (three) times daily as needed for anxiety.   amiodarone 200 MG tablet Commonly known as: PACERONE Take 1 tablet (200 mg total) by mouth daily. What changed: how much to take   amLODipine 10 MG tablet Commonly known as: NORVASC Take 1 tablet (10 mg total) by mouth daily.   apixaban 5 MG Tabs tablet Commonly known as: ELIQUIS Take 1 tablet (5 mg total) by mouth 2 (two) times daily.   atorvastatin 40 MG tablet Commonly known as: LIPITOR TAKE 1 TABLET BY MOUTH EVERY DAY   blood glucose meter kit and supplies Dispense based on patient and insurance preference. Use up to four times daily as directed. E11.9   cyanocobalamin 1000 MCG tablet Commonly known as: [employer] VITAMIN B12 Take 1 tablet (1,000 mcg total) by mouth daily.   furosemide 40 MG tablet Commonly known as: Lasix Take 1 tablet (40 mg total) by mouth daily. What changed:  medication strength how much to take when to take this reasons to take this   glimepiride 2 MG tablet Commonly known as: AMARYL TAKE  1 TABLET BY MOUTH EVERY DAY BEFORE BREAKFAST What changed: See the new instructions.   hydrALAZINE 100 MG tablet Commonly known as: APRESOLINE Take 1 tablet (100 mg total) by mouth every 8 (eight) hours.   metFORMIN 1000 MG tablet Commonly known as: GLUCOPHAGE TAKE 1 TAB BY MOUTH IN THE AM,AND 1/2 TAB IN THE PM What changed:  how much to take how to take this when to take this additional instructions   ONE TOUCH ULTRA 2 w/Device Kit Use as directed daily   OneTouch Delica Plus [id number] Misc USE AS DIRECTED TEST 1-2 TIMES A DAY   OneTouch Ultra test strip Generic drug: glucose blood USE AS INSTRUCTED TEST 1-2 TIMES A DAY   pantoprazole 40 MG tablet Commonly known as: PROTONIX Take 1 tablet (40 mg total) by mouth 2 (two) times daily. What changed: when to take this   potassium chloride SA 20 MEQ tablet Commonly known as: KLOR-CON Take 1 tablet (20 mEq total) by mouth daily.          Outstanding Labs/Studies   Bmet, cbc  Duration of Discharge Encounter   Greater than 30 minutes including physician time.  SignedAngelena Form, PA-C 10/28/2020, 9:11 AM [phone]

## 2020-10-29 ENCOUNTER — Telehealth: Payer: Self-pay | Admitting: Cardiology

## 2020-10-29 ENCOUNTER — Telehealth: Payer: Self-pay | Admitting: Physician Assistant

## 2020-10-29 ENCOUNTER — Telehealth: Payer: Self-pay | Admitting: *Deleted

## 2020-10-29 DIAGNOSIS — I442 Atrioventricular block, complete: Secondary | ICD-10-CM | POA: Diagnosis not present

## 2020-10-29 DIAGNOSIS — Z952 Presence of prosthetic heart valve: Secondary | ICD-10-CM | POA: Diagnosis not present

## 2020-10-29 LAB — BPAM RBC
Blood Product Expiration Date: 202210122359
Blood Product Expiration Date: 202210122359
Unit Type and Rh: 6200
Unit Type and Rh: 6200

## 2020-10-29 LAB — TYPE AND SCREEN
ABO/RH(D): A POS
Antibody Screen: NEGATIVE
Unit division: 0
Unit division: 0

## 2020-10-29 NOTE — Telephone Encounter (Signed)
New message:     Tresa Endo from [employer].

## 2020-10-29 NOTE — Telephone Encounter (Signed)
  [facility]   MULTIDISCIPLINARY HEART VALVE TEAM   Patient contacted regarding discharge from [facility] on 9/23  Patient understands to follow up with provider Carlean Jews on 9/29 at [address] [facility].  Patient understands discharge instructions? yes Patient understands medications and regimen? yes Patient understands to bring all medications to this visit? Yes  Had a brief episode of afib on [facility]. It was asymptomatic. If she has more issues with this we can bump up her amiodarone.   Cline Crock PA-C  [facility]

## 2020-10-29 NOTE — Telephone Encounter (Addendum)
   Cardiac Monitor Alert  Date of alert:  10/29/2020   Patient Name: Mary Mccarthy  DOB: 09-17-1940  MRN: [id number]   [facility] Cardiologist: Bryan Lemma, MD  [facility] EP:  None    Monitor Information: Long Term Monitor-Live Telemetry [ZioAT]  Reason:  s/p TAVR Ordering provider:  K. Thompson  :1}  Alert Atrial Fibrillation/Flutter This is the 1st alert for this rhythm.  The patient has a hx of Atrial Fibrillation.  The patient is currently on anticoagulation. Anticoagulation medication as of 10/29/2020           apixaban (ELIQUIS) 5 MG TABS tablet Take 1 tablet (5 mg total) by mouth 2 (two) times daily.       Next Cardiology Appointment   Date:  11/25/20  Provider:  [name]  The patient was contacted today.  She is asymptomatic. Arrhythmia, symptoms and history reviewed with DOD Nishan.  Plan:  Strip confirmed and signed. Placed to be scanned into epic. Continue to monitor.     Other: Auto triggered event on enrollment day.  October 28, 2020 Atrial Fibrillation continuously, ranging from 56-152 bpm. Hx of Afib identified July 5. On Apixaban.   Patient is feeling good this morning, no concerns. Confirmed no missed doses of Apixaban.   Sampson Goon, RN  10/29/2020 8:45 AM

## 2020-10-29 NOTE — Telephone Encounter (Signed)
Spoke with Tresa Endo from [employer] [facility] [employer] she states that pt has the 1st fun of AFIB at 601pm for 90 seconds. She states that they contacted the pt and she was Asymptomatic and did not know she went into AFIB.  This was already done by Inetta Fermo, see other telephone from today. Duplicate messge

## 2020-11-01 ENCOUNTER — Other Ambulatory Visit: Payer: Self-pay

## 2020-11-01 ENCOUNTER — Other Ambulatory Visit: Payer: Self-pay | Admitting: Physician Assistant

## 2020-11-01 ENCOUNTER — Encounter (HOSPITAL_COMMUNITY): Payer: Self-pay

## 2020-11-01 ENCOUNTER — Emergency Department (HOSPITAL_COMMUNITY): Payer: Medicare Other

## 2020-11-01 ENCOUNTER — Telehealth: Payer: Self-pay | Admitting: Physician Assistant

## 2020-11-01 ENCOUNTER — Inpatient Hospital Stay (HOSPITAL_COMMUNITY)
Admission: EM | Admit: 2020-11-01 | Discharge: 2020-11-04 | DRG: 640 | Disposition: A | Discharge status: Home / Self Care | Payer: Medicare Other | Attending: Internal Medicine | Admitting: Internal Medicine

## 2020-11-01 ENCOUNTER — Other Ambulatory Visit: Payer: Medicare Other | Admitting: *Deleted

## 2020-11-01 DIAGNOSIS — N179 Acute kidney failure, unspecified: Secondary | ICD-10-CM | POA: Diagnosis present

## 2020-11-01 DIAGNOSIS — E785 Hyperlipidemia, unspecified: Secondary | ICD-10-CM | POA: Diagnosis not present

## 2020-11-01 DIAGNOSIS — I272 Pulmonary hypertension, unspecified: Secondary | ICD-10-CM | POA: Diagnosis not present

## 2020-11-01 DIAGNOSIS — E669 Obesity, unspecified: Secondary | ICD-10-CM | POA: Diagnosis present

## 2020-11-01 DIAGNOSIS — R0602 Shortness of breath: Secondary | ICD-10-CM

## 2020-11-01 DIAGNOSIS — E860 Dehydration: Secondary | ICD-10-CM | POA: Diagnosis not present

## 2020-11-01 DIAGNOSIS — Z953 Presence of xenogenic heart valve: Secondary | ICD-10-CM

## 2020-11-01 DIAGNOSIS — M810 Age-related osteoporosis without current pathological fracture: Secondary | ICD-10-CM | POA: Diagnosis not present

## 2020-11-01 DIAGNOSIS — N183 Chronic kidney disease, stage 3 unspecified: Secondary | ICD-10-CM | POA: Diagnosis present

## 2020-11-01 DIAGNOSIS — D631 Anemia in chronic kidney disease: Secondary | ICD-10-CM | POA: Diagnosis not present

## 2020-11-01 DIAGNOSIS — R110 Nausea: Secondary | ICD-10-CM

## 2020-11-01 DIAGNOSIS — Z7984 Long term (current) use of oral hypoglycemic drugs: Secondary | ICD-10-CM

## 2020-11-01 DIAGNOSIS — Z7901 Long term (current) use of anticoagulants: Secondary | ICD-10-CM

## 2020-11-01 DIAGNOSIS — E872 Acidosis: Secondary | ICD-10-CM | POA: Diagnosis present

## 2020-11-01 DIAGNOSIS — Z8249 Family history of ischemic heart disease and other diseases of the circulatory system: Secondary | ICD-10-CM

## 2020-11-01 DIAGNOSIS — R778 Other specified abnormalities of plasma proteins: Secondary | ICD-10-CM | POA: Diagnosis not present

## 2020-11-01 DIAGNOSIS — I5032 Chronic diastolic (congestive) heart failure: Secondary | ICD-10-CM | POA: Diagnosis present

## 2020-11-01 DIAGNOSIS — K219 Gastro-esophageal reflux disease without esophagitis: Secondary | ICD-10-CM | POA: Diagnosis present

## 2020-11-01 DIAGNOSIS — N182 Chronic kidney disease, stage 2 (mild): Secondary | ICD-10-CM | POA: Diagnosis present

## 2020-11-01 DIAGNOSIS — I480 Paroxysmal atrial fibrillation: Secondary | ICD-10-CM | POA: Diagnosis not present

## 2020-11-01 DIAGNOSIS — Z20822 Contact with and (suspected) exposure to covid-19: Secondary | ICD-10-CM | POA: Diagnosis not present

## 2020-11-01 DIAGNOSIS — I700 Atherosclerosis of aorta: Secondary | ICD-10-CM | POA: Diagnosis present

## 2020-11-01 DIAGNOSIS — J9601 Acute respiratory failure with hypoxia: Secondary | ICD-10-CM | POA: Diagnosis present

## 2020-11-01 DIAGNOSIS — D6869 Other thrombophilia: Secondary | ICD-10-CM | POA: Diagnosis present

## 2020-11-01 DIAGNOSIS — E1169 Type 2 diabetes mellitus with other specified complication: Secondary | ICD-10-CM

## 2020-11-01 DIAGNOSIS — R001 Bradycardia, unspecified: Secondary | ICD-10-CM | POA: Diagnosis present

## 2020-11-01 DIAGNOSIS — F419 Anxiety disorder, unspecified: Secondary | ICD-10-CM | POA: Diagnosis present

## 2020-11-01 DIAGNOSIS — Z8673 Personal history of transient ischemic attack (TIA), and cerebral infarction without residual deficits: Secondary | ICD-10-CM

## 2020-11-01 DIAGNOSIS — E119 Type 2 diabetes mellitus without complications: Secondary | ICD-10-CM

## 2020-11-01 DIAGNOSIS — F32A Depression, unspecified: Secondary | ICD-10-CM | POA: Diagnosis not present

## 2020-11-01 DIAGNOSIS — Z952 Presence of prosthetic heart valve: Secondary | ICD-10-CM

## 2020-11-01 DIAGNOSIS — E1122 Type 2 diabetes mellitus with diabetic chronic kidney disease: Secondary | ICD-10-CM | POA: Diagnosis not present

## 2020-11-01 DIAGNOSIS — I10 Essential (primary) hypertension: Secondary | ICD-10-CM | POA: Diagnosis present

## 2020-11-01 DIAGNOSIS — R112 Nausea with vomiting, unspecified: Secondary | ICD-10-CM | POA: Diagnosis not present

## 2020-11-01 DIAGNOSIS — Z6836 Body mass index (BMI) 36.0-36.9, adult: Secondary | ICD-10-CM

## 2020-11-01 DIAGNOSIS — I499 Cardiac arrhythmia, unspecified: Secondary | ICD-10-CM | POA: Diagnosis not present

## 2020-11-01 DIAGNOSIS — I472 Ventricular tachycardia: Secondary | ICD-10-CM | POA: Diagnosis not present

## 2020-11-01 DIAGNOSIS — R1110 Vomiting, unspecified: Secondary | ICD-10-CM | POA: Diagnosis not present

## 2020-11-01 DIAGNOSIS — Z833 Family history of diabetes mellitus: Secondary | ICD-10-CM

## 2020-11-01 DIAGNOSIS — I2510 Atherosclerotic heart disease of native coronary artery without angina pectoris: Secondary | ICD-10-CM | POA: Diagnosis not present

## 2020-11-01 DIAGNOSIS — K5900 Constipation, unspecified: Secondary | ICD-10-CM | POA: Diagnosis not present

## 2020-11-01 DIAGNOSIS — I130 Hypertensive heart and chronic kidney disease with heart failure and stage 1 through stage 4 chronic kidney disease, or unspecified chronic kidney disease: Secondary | ICD-10-CM | POA: Diagnosis present

## 2020-11-01 DIAGNOSIS — I517 Cardiomegaly: Secondary | ICD-10-CM | POA: Diagnosis not present

## 2020-11-01 DIAGNOSIS — Z7689 Persons encountering health services in other specified circumstances: Secondary | ICD-10-CM | POA: Diagnosis not present

## 2020-11-01 DIAGNOSIS — Z79899 Other long term (current) drug therapy: Secondary | ICD-10-CM

## 2020-11-01 DIAGNOSIS — R531 Weakness: Secondary | ICD-10-CM | POA: Diagnosis not present

## 2020-11-01 DIAGNOSIS — Z743 Need for continuous supervision: Secondary | ICD-10-CM | POA: Diagnosis not present

## 2020-11-01 DIAGNOSIS — Z886 Allergy status to analgesic agent status: Secondary | ICD-10-CM

## 2020-11-01 LAB — BRAIN NATRIURETIC PEPTIDE: B Natriuretic Peptide: 124.6 pg/mL — ABNORMAL HIGH (ref 0.0–100.0)

## 2020-11-01 LAB — CBC WITH DIFFERENTIAL/PLATELET
Abs Immature Granulocytes: 0.01 10*3/uL (ref 0.00–0.07)
Basophils Absolute: 0 10*3/uL (ref 0.0–0.1)
Basophils Relative: 1 %
Eosinophils Absolute: 0.1 10*3/uL (ref 0.0–0.5)
Eosinophils Relative: 1 %
HCT: 27 % — ABNORMAL LOW (ref 36.0–46.0)
Hemoglobin: 8.9 g/dL — ABNORMAL LOW (ref 12.0–15.0)
Immature Granulocytes: 0 %
Lymphocytes Relative: 37 %
Lymphs Abs: 2 10*3/uL (ref 0.7–4.0)
MCH: 30.8 pg (ref 26.0–34.0)
MCHC: 33 g/dL (ref 30.0–36.0)
MCV: 93.4 fL (ref 80.0–100.0)
Monocytes Absolute: 0.6 10*3/uL (ref 0.1–1.0)
Monocytes Relative: 10 %
Neutro Abs: 2.8 10*3/uL (ref 1.7–7.7)
Neutrophils Relative %: 51 %
Platelets: 234 10*3/uL (ref 150–400)
RBC: 2.89 MIL/uL — ABNORMAL LOW (ref 3.87–5.11)
RDW: 14.8 % (ref 11.5–15.5)
WBC: 5.5 10*3/uL (ref 4.0–10.5)
nRBC: 0 % (ref 0.0–0.2)

## 2020-11-01 LAB — LACTIC ACID, PLASMA: Lactic Acid, Venous: 2.6 mmol/L (ref 0.5–1.9)

## 2020-11-01 LAB — POC OCCULT BLOOD, ED: Fecal Occult Bld: NEGATIVE

## 2020-11-01 LAB — TYPE AND SCREEN
ABO/RH(D): A POS
Antibody Screen: NEGATIVE

## 2020-11-01 LAB — COMPREHENSIVE METABOLIC PANEL
ALT: 21 U/L (ref 0–44)
AST: 32 U/L (ref 15–41)
Albumin: 3.6 g/dL (ref 3.5–5.0)
Alkaline Phosphatase: 49 U/L (ref 38–126)
Anion gap: 13 (ref 5–15)
BUN: 24 mg/dL — ABNORMAL HIGH (ref 8–23)
CO2: 20 mmol/L — ABNORMAL LOW (ref 22–32)
Calcium: 10.6 mg/dL — ABNORMAL HIGH (ref 8.9–10.3)
Chloride: 107 mmol/L (ref 98–111)
Creatinine, Ser: 1.41 mg/dL — ABNORMAL HIGH (ref 0.44–1.00)
GFR, Estimated: 37 mL/min — ABNORMAL LOW (ref >60)
Glucose, Bld: 73 mg/dL (ref 70–99)
Potassium: 3.8 mmol/L (ref 3.5–5.1)
Sodium: 140 mmol/L (ref 135–145)
Total Bilirubin: 1 mg/dL (ref 0.3–1.2)
Total Protein: 7.1 g/dL (ref 6.5–8.1)

## 2020-11-01 LAB — PROTIME-INR
INR: 1.7 — ABNORMAL HIGH (ref 0.8–1.2)
Prothrombin Time: 19.6 seconds — ABNORMAL HIGH (ref 11.4–15.2)

## 2020-11-01 LAB — TROPONIN I (HIGH SENSITIVITY): Troponin I (High Sensitivity): 211 ng/L (ref <18)

## 2020-11-01 IMAGING — DX DG CHEST 1V PORT
1 series · 1 of 1 positions shown · non-contrast
Comparison: [DATE]

CLINICAL DATA: Weakness nausea vomiting

EXAM:
PORTABLE CHEST 1 VIEW

[chest ap]
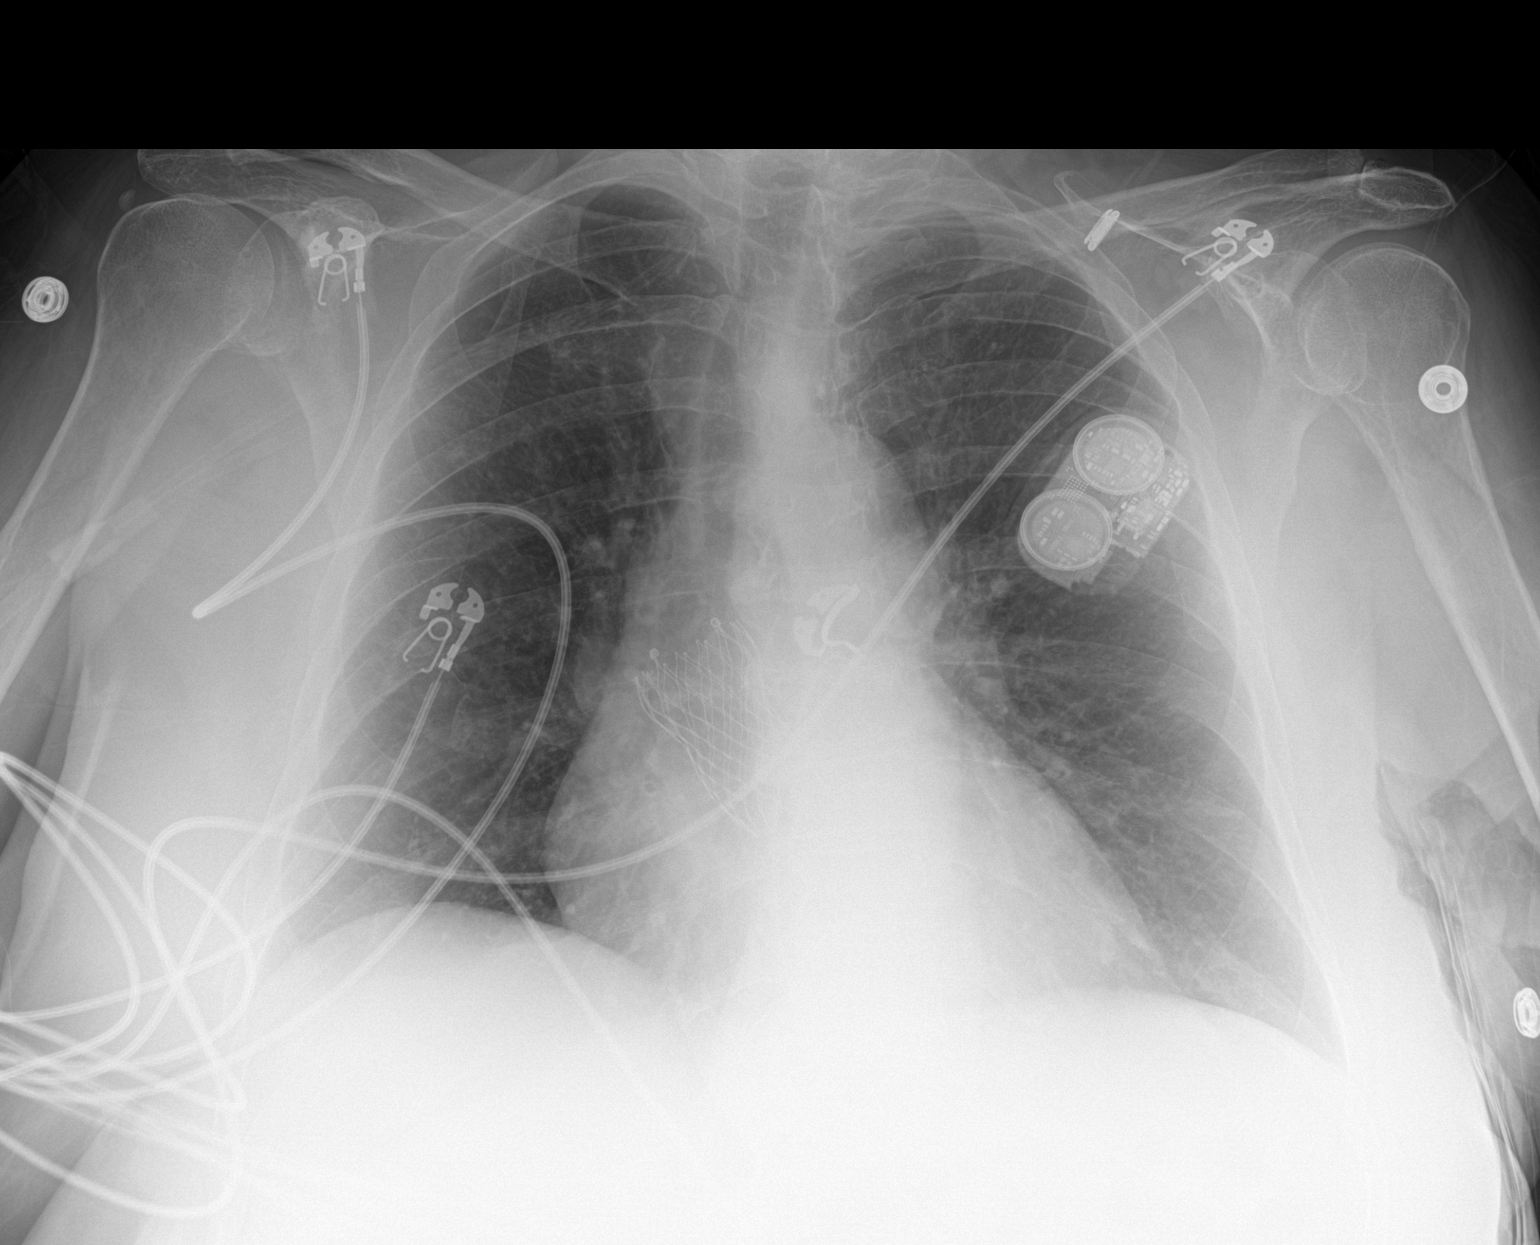

[1 of 1 positions shown; findings below may reference images not displayed]

FINDINGS: Electronic device over the left chest. Redemonstrated TAVR. No focal
opacity, pleural effusion or pneumothorax. Mild cardiomegaly.
IMPRESSION: No active disease.  Mild cardiomegaly

## 2020-11-01 MED ORDER — SODIUM CHLORIDE 0.9 % IV BOLUS
500.0000 mL | Freq: Once | INTRAVENOUS | Status: AC
  Administered 2020-11-01: 500 mL via INTRAVENOUS

## 2020-11-01 MED ORDER — LORAZEPAM 2 MG/ML IJ SOLN
0.5000 mg | Freq: Once | INTRAMUSCULAR | Status: AC
  Administered 2020-11-01: 0.5 mg via INTRAVENOUS
  Filled 2020-11-01: qty 1

## 2020-11-01 MED ORDER — ONDANSETRON HCL 4 MG/2ML IJ SOLN
4.0000 mg | Freq: Once | INTRAMUSCULAR | Status: AC
  Administered 2020-11-01: 4 mg via INTRAVENOUS
  Filled 2020-11-01: qty 2

## 2020-11-01 MED ORDER — SODIUM CHLORIDE 0.9 % IV SOLN: Freq: Once | INTRAVENOUS | Status: AC

## 2020-11-01 MED ORDER — ONDANSETRON HCL 4 MG PO TABS: 4.0000 mg | ORAL_TABLET | Freq: Three times a day (TID) | ORAL | 0 refills | Status: DC | PRN

## 2020-11-01 NOTE — Telephone Encounter (Addendum)
Reviewed further [facility] monitor reports and there has been some afib and VT. Will review with a MD.        Cline Crock PA-C  [facility]

## 2020-11-01 NOTE — ED Provider Notes (Signed)
[facility] DEPT Provider Note   CSN: [id number] Arrival date & time: 11/01/20  1800     History Chief Complaint  Patient presents with   Nausea    Mary Mccarthy is a 80 y.o. female.  80 year old female with prior medical history as detailed below presents for evaluation.  Patient reports recent admission for initially treatment of A. fib with RVR.  Patient's course was complicated by GI bleed.  She then received a TAVR with the complication of transient CHB.   She was discharged on the 22nd.  She returns today complaining of persistent nausea and vomiting.  She has been unable to keep her medications down over the last 24 hours.  She denies associated fever.  She denies chest pain or shortness of breath.  She is visibly anxious on arrival.     The history is provided by the patient.  Illness Location:  Nausea /  vomiting Severity:  Moderate Onset quality:  Gradual Duration:  1 day Timing:  Constant Progression:  Unchanged Chronicity:  New     Past Medical History:  Diagnosis Date   ALLERGIC RHINITIS 06/24/2007   Anemia    AVM (arteriovenous malformation) of colon    Blood transfusion without reported diagnosis 05/2015   COLONIC POLYPS, HX OF 06/24/2007   Coronary artery disease, non-occlusive 05/2015   Trop + w/ Acute Anemia =>CATH: small RI - Ostial 60%, ostial RCA 30% and dLAD 40-50%;; 9/'21: Cor Ca Score 624. Mild (25-49%) prox RCA & LAD,; Moderate (50-69%) Ostial Small RI & prox LCx.     DEPRESSION 10/08/2006   DIABETES MELLITUS, TYPE II 10/05/2006   GERD 06/24/2007   History of CVA (cerebrovascular accident)    08/2020- found to be in afib with RVR, started on Eliquis   HYPERLIPIDEMIA 10/08/2006   HYPERTENSION 10/08/2006   INSOMNIA 09/24/2008   Left knee DJD    Osteoporosis 03/10/2016   PAF (paroxysmal atrial fibrillation) ([place])    PEPTIC ULCER DISEASE 10/08/2006   S/P TAVR (transcatheter aortic valve replacement)  10/26/2020   s/p TAVR with a 26 mm Medtronic Evolut Pro+ via the TF approach by Dr. Angelena Form & Dr. Cyndia Bent   Severe aortic stenosis     Patient Active Problem List   Diagnosis Date Noted   CHB (complete heart block) ([place]) 10/28/2020   S/P TAVR (transcatheter aortic valve replacement) 10/26/2020   Melena 10/20/2020   Symptomatic anemia 10/19/2020   Acute bronchitis 09/21/2020   Paroxysmal atrial fibrillation ([place]) 08/23/2020   Secondary hypercoagulable state ([place]) 08/23/2020   Atrial fibrillation with RVR ([place]) 08/11/2020   History of CVA (cerebrovascular accident) 08/10/2020   Hypomagnesemia 08/10/2020   Aortic atherosclerosis ([place]) 03/18/2020   B12 deficiency 09/21/2019   Cough 03/14/2018   Wheezing 03/14/2018   Anxiety 09/11/2017   Severe aortic stenosis 03/14/2017   Osteoporosis 03/10/2016   Coronary artery disease, non-occlusive 05/24/2015   GI bleed    Pulmonary hypertension ([facility])    Chronic diastolic heart failure ([place]) 05/20/2015   Gastric ulceration    AVM (arteriovenous malformation) of colon    Demand ischemia of myocardium ([place])    Right knee pain 05/21/2014   Anemia, iron deficiency 05/20/2013   MENOPAUSAL DISORDER 12/07/2009   INSOMNIA 09/24/2008   ALLERGIC RHINITIS 06/24/2007   GERD 06/24/2007   COLONIC POLYPS, HX OF 06/24/2007   Hyperlipidemia with target LDL less than 70 10/08/2006   Depression 10/08/2006   Essential hypertension 10/08/2006   PEPTIC ULCER DISEASE 10/08/2006  Type 2 diabetes mellitus with hyperglycemia, without long-term current use of insulin ([place]) 10/05/2006   Morbid obesity ([place]) 10/05/2006    Past Surgical History:  Procedure Laterality Date   APPENDECTOMY     BIOPSY  10/21/2020   Procedure: BIOPSY;  Surgeon: Sharyn Creamer, MD;  Location: [facility] ENDOSCOPY;  Service: Gastroenterology;;   BREAST BIOPSY     CARDIAC CATHETERIZATION N/A 05/21/2015   Procedure: Left Heart Cath and Coronary Angiography;  Surgeon: Leonie Man, MD;   Location: [facility];  Service: Cardiovascular;: Ost RI 60%, Ost RCA 30%, dLAD tapers to small vessel w/ 40-50%. Mildly elevated LVEDP. Normal LV Fxn.   COLONOSCOPY N/A 05/20/2015   Procedure: COLONOSCOPY;  Surgeon: Manus Gunning, MD;  Location: [place];  Service: Gastroenterology;  Laterality: N/A;   CORONARY CA2+ SCORE / CARDIAC CT ANGIOGRAM  10/09/2019   Calcium score 624.  82nd percentile. Dominant RCA: Mild (25-49%) proximal stenosis-distal bifurcation into PDA and PAV--< RPL branches.  LAD (1 major mid vessel diagonal) diffuse calcified plaque, mild proximal stenosis with minimal distal stenosis.  Small RI moderate ostial disease.  LCx-moderate mixed (50-69%) proximal stenosis.  Small dOM1 disease.  Trileaflet AoV, annular Ca2+ - probable AS   ESOPHAGOGASTRODUODENOSCOPY N/A 05/20/2015   Procedure: ESOPHAGOGASTRODUODENOSCOPY (EGD);  Surgeon: Manus Gunning, MD;  Location: [facility];  Service: Gastroenterology;  Laterality: N/A;   ESOPHAGOGASTRODUODENOSCOPY (EGD) WITH PROPOFOL N/A 10/21/2020   Procedure: ESOPHAGOGASTRODUODENOSCOPY (EGD) WITH PROPOFOL;  Surgeon: Sharyn Creamer, MD;  Location: [facility];  Service: Gastroenterology;  Laterality: N/A;   INTRAOPERATIVE TRANSTHORACIC ECHOCARDIOGRAM N/A 10/26/2020   Procedure: INTRAOPERATIVE TRANSTHORACIC ECHOCARDIOGRAM;  Surgeon: Burnell Blanks, MD;  Location: [facility];  Service: Open Heart Surgery;  Laterality: N/A;   RIGHT/LEFT HEART CATH AND CORONARY ANGIOGRAPHY N/A 09/29/2020   Procedure: RIGHT/LEFT HEART CATH AND CORONARY ANGIOGRAPHY;  Surgeon: Burnell Blanks, MD;  Location: [facility];  Service: Cardiovascular;  Laterality: N/A;   TONSILLECTOMY     TRANSCATHETER AORTIC VALVE REPLACEMENT, TRANSFEMORAL N/A 10/26/2020   Procedure: TRANSCATHETER AORTIC VALVE REPLACEMENT, TRANSFEMORAL;  Surgeon: Burnell Blanks, MD;  Location: [facility];  Service: Open Heart Surgery;  Laterality: N/A;    TRANSTHORACIC ECHOCARDIOGRAM  03/2019; 09/2019   a) EF 60 to 65%.  Moderate LVH.  GRII DD.  Mod-Severe AS (m grad 36 mmHg, peak 59 mmHg); b) EF 65-70%, No RWMA. Gr1 DD/hi LAP, Mild hi PAP. Mod LA Dil. MOD AS (mean Grad 34.5 mmHg).  STABLE    TRANSTHORACIC ECHOCARDIOGRAM  04/12/2017   EF 60-65%. No RWMA.  GR 1 DD.  Moderate concentric LVH.  Mild LA dilation. Severe calcified aortic valve with moderate-severe aortic stenosis (peak/mean gradients 60/34 mmHg) and in severe range by AVA (0.8 cm2).  Mild to moderately increased PA pressures 41 mmHg with normal RV size and function..     TUBAL LIGATION       OB History   No obstetric history on file.     Family History  Problem Relation Age of Onset   Alcohol abuse Other    Arthritis Other        DJD   Hyperlipidemia Other    Heart disease Other    Stroke Other    Hypertension Other    Depression Other    Diabetes Other    Cancer Mother        ENT cancer   CAD Sister        Several MI & PPM; long  term smoker, EtOH   Heart attack Sister 77       several   Arrhythmia Sister        s/p PPM (Dr. Sallyanne Kuster)    Social History   Tobacco Use   Smoking status: Never   Smokeless tobacco: Never  Vaping Use   Vaping Use: Never used  Substance Use Topics   Alcohol use: No    Alcohol/week: 0.0 standard drinks   Drug use: No    Home Medications Prior to Admission medications   Medication Sig Start Date End Date Taking? Authorizing Provider  ALPRAZolam (XANAX) 0.25 MG tablet Take 1 tablet (0.25 mg total) by mouth 3 (three) times daily as needed for anxiety. 10/06/20  Yes Biagio Borg, MD  amiodarone (PACERONE) 200 MG tablet Take 1 tablet (200 mg total) by mouth daily. 10/28/20  Yes Eileen Stanford, PA-C  apixaban (ELIQUIS) 5 MG TABS tablet Take 1 tablet (5 mg total) by mouth 2 (two) times daily. 08/19/20  Yes Biagio Borg, MD  atorvastatin (LIPITOR) 40 MG tablet TAKE 1 TABLET BY MOUTH EVERY DAY Patient taking differently: Take 40 mg by  mouth daily. 08/20/20  Yes Leonie Man, MD  cyanocobalamin ([employer] VITAMIN B12) 1000 MCG tablet Take 1 tablet (1,000 mcg total) by mouth daily. 07/14/20  Yes Biagio Borg, MD  furosemide (LASIX) 40 MG tablet Take 1 tablet (40 mg total) by mouth daily. 10/28/20  Yes Eileen Stanford, PA-C  glimepiride (AMARYL) 2 MG tablet TAKE 1 TABLET BY MOUTH EVERY DAY BEFORE BREAKFAST Patient taking differently: Take 2 mg by mouth daily with breakfast. 09/16/20  Yes Biagio Borg, MD  metFORMIN (GLUCOPHAGE) 1000 MG tablet TAKE 1 TAB BY MOUTH IN THE AM,AND 1/2 TAB IN THE PM Patient taking differently: Take 500-1,000 mg by mouth 2 (two) times daily with a meal. Take $RemoveBe'1000mg'[id number]$  in the AM and 500 in the PM 07/14/20  Yes Biagio Borg, MD  ondansetron (ZOFRAN) 4 MG tablet Take 1 tablet (4 mg total) by mouth every 8 (eight) hours as needed for nausea or vomiting. 11/01/20   Eileen Stanford, PA-C  pantoprazole (PROTONIX) 40 MG tablet Take 1 tablet (40 mg total) by mouth 2 (two) times daily. 10/28/20  Yes Eileen Stanford, PA-C  potassium chloride SA (KLOR-CON) 20 MEQ tablet Take 1 tablet (20 mEq total) by mouth daily. 10/28/20  Yes Eileen Stanford, PA-C  acetaminophen (TYLENOL) 500 MG tablet Take 500 mg by mouth every 6 (six) hours as needed for moderate pain.    [provider]  amLODipine (NORVASC) 10 MG tablet Take 1 tablet (10 mg total) by mouth daily. 10/28/20   Eileen Stanford, PA-C  blood glucose meter kit and supplies Dispense based on patient and insurance preference. Use up to four times daily as directed. E11.9 04/15/18   Biagio Borg, MD  Blood Glucose Monitoring Suppl (ONE TOUCH ULTRA 2) w/Device KIT Use as directed daily 06/01/15   Biagio Borg, MD  hydrALAZINE (APRESOLINE) 100 MG tablet Take 1 tablet (100 mg total) by mouth every 8 (eight) hours. Patient not taking: Reported on 11/01/2020 10/28/20   Eileen Stanford, PA-C  Lancets [facility] DELICA PLUS [id number]) MISC USE AS DIRECTED TEST 1-2  TIMES A DAY 09/05/20   Biagio Borg, MD  [facility] ULTRA test strip USE AS INSTRUCTED TEST 1-2 TIMES A DAY 10/12/20   Biagio Borg, MD    Allergies    Ibuprofen  Review of Systems   Review of Systems  All other systems reviewed and are negative.  Physical Exam Updated Vital Signs BP (!) 172/66   Pulse 82   Temp (!) 97.4 F (36.3 C) (Oral)   Resp 16   Ht $R'5\' 1"'Cn$  (1.549 m)   Wt 88 kg   SpO2 99%   BMI 36.66 kg/m   Physical Exam Vitals and nursing note reviewed.  Constitutional:      General: She is not in acute distress.    Appearance: Normal appearance. She is well-developed.  HENT:     Head: Normocephalic and atraumatic.  Eyes:     Conjunctiva/sclera: Conjunctivae normal.     Pupils: Pupils are equal, round, and reactive to light.  Cardiovascular:     Rate and Rhythm: Normal rate and regular rhythm.     Heart sounds: Normal heart sounds.  Pulmonary:     Effort: Pulmonary effort is normal. No respiratory distress.     Breath sounds: Normal breath sounds.  Abdominal:     General: There is no distension.     Palpations: Abdomen is soft.     Tenderness: There is no abdominal tenderness.  Musculoskeletal:        General: No deformity. Normal range of motion.     Cervical back: Normal range of motion and neck supple.  Skin:    General: Skin is warm and dry.  Neurological:     General: No focal deficit present.     Mental Status: She is alert and oriented to person, place, and time.    ED Results / Procedures / Treatments   Labs (all labs ordered are listed, but only abnormal results are displayed) Labs Reviewed  COMPREHENSIVE METABOLIC PANEL - Abnormal; Notable for the following components:      Result Value   CO2 20 (*)    BUN 24 (*)    Creatinine, Ser 1.41 (*)    Calcium 10.6 (*)    GFR, Estimated 37 (*)    All other components within normal limits  LACTIC ACID, PLASMA - Abnormal; Notable for the following components:   Lactic Acid, Venous 2.6 (*)    All  other components within normal limits  CBC WITH DIFFERENTIAL/PLATELET - Abnormal; Notable for the following components:   RBC 2.89 (*)    Hemoglobin 8.9 (*)    HCT 27.0 (*)    All other components within normal limits  PROTIME-INR - Abnormal; Notable for the following components:   Prothrombin Time 19.6 (*)    INR 1.7 (*)    All other components within normal limits  TROPONIN I (HIGH SENSITIVITY) - Abnormal; Notable for the following components:   Troponin I (High Sensitivity) 211 (*)    All other components within normal limits  LACTIC ACID, PLASMA  URINALYSIS, ROUTINE W REFLEX MICROSCOPIC  BRAIN NATRIURETIC PEPTIDE  POC OCCULT BLOOD, ED  TYPE AND SCREEN  TROPONIN I (HIGH SENSITIVITY)    EKG EKG Interpretation  Date/Time:  Monday November 01 2020 19:42:22 EDT Ventricular Rate:  84 PR Interval:  151 QRS Duration: 98 QT Interval:  394 QTC Calculation: 466 R Axis:   11 Text Interpretation: Sinus rhythm Repol abnrm suggests ischemia, diffuse leads Borderline ST elevation, anterior leads Confirmed by Dene Gentry [phone]) on 11/01/2020 7:50:38 PM  Radiology DG Chest Port 1 View  Result Date: 11/01/2020 CLINICAL DATA:  Weakness nausea vomiting EXAM: PORTABLE CHEST 1 VIEW COMPARISON:  10/26/2020 FINDINGS: Electronic device over the left chest. Redemonstrated TAVR.  No focal opacity, pleural effusion or pneumothorax. Mild cardiomegaly. IMPRESSION: No active disease.  Mild cardiomegaly Electronically Signed   By: Donavan Foil M.D.   On: 11/01/2020 19:46    Procedures Procedures   Medications Ordered in ED Medications  ondansetron (ZOFRAN) injection 4 mg (4 mg Intravenous Given 11/01/20 2018)  LORazepam (ATIVAN) injection 0.5 mg (0.5 mg Intravenous Given 11/01/20 2018)  sodium chloride 0.9 % bolus 500 mL (500 mLs Intravenous New Bag/Given (Non-Interop) 11/01/20 2126)    ED Course  I have reviewed the triage vital signs and the nursing notes.  Pertinent labs & imaging results  that were available during my care of the patient were reviewed by me and considered in my medical decision making (see chart for details).    MDM Rules/Calculators/A&P                           MDM  MSE complete  Mary Mccarthy was evaluated in Emergency Department on 11/01/2020 for the symptoms described in the history of present illness. She was evaluated in the context of the global COVID-19 pandemic, which necessitated consideration that the patient might be at risk for infection with the SARS-CoV-2 virus that causes COVID-19. Institutional protocols and algorithms that pertain to the evaluation of patients at risk for COVID-19 are in a state of rapid change based on information released by regulatory bodies including the CDC and federal and state organizations. These policies and algorithms were followed during the patient's care in the ED.  Patient is presenting with complaint of persistent nausea and associated vomiting.  Patient is status post recent complicated admission for treatment of A. fib with RVR, GI bleed, and TAVR placement.  Patient's symptoms improved significantly with administration of small dose of Ativan and Zofran.  Screening labs are notable for mildly elevated troponin at 211.  BUN / CR mildly elevated today at 24/1.41. Lactic acid elevated at 2.6.  Case discussed with [facility] cardiology -Dr. Clayton Bibles - who agrees with ED care and will consult in AM.  Patient would benefit from admission - Hospitalist service is aware of case and will evaluate.    Final Clinical Impression(s) / ED   Diagnoses Final diagnoses:  Nausea  Nausea and vomiting, intractability of vomiting not specified, unspecified vomiting type    Rx / DC Orders ED Discharge Orders     None        Valarie Merino, MD 11/01/20 2330

## 2020-11-01 NOTE — Telephone Encounter (Signed)
Patient's son is following up. He states the patient has not gotten any better, but she is unable to go to the ER for evaluation. He would like to know if Mary Mccarthy has any other recommendations. Please advise.

## 2020-11-01 NOTE — ED Notes (Signed)
Re-paged Joycelyn Das MD [phone] for call back to [facility] MD [phone]

## 2020-11-01 NOTE — Telephone Encounter (Signed)
Spoke with the pts son and after talking with Philomena Course PA...Marland Kitchenhe reports that the pt is not doing any better... she is unable to get up, she is very nauseated and it took her over an hour to eat a small jello... I urged him to call EMS and to have her placed on a monitor and be taken to the ED for more immediate assessment... he says he will talk with her and will call 911.

## 2020-11-01 NOTE — ED Notes (Addendum)
Re-paged [facility] Jerene Pitch MD [phone] for call back to [facility] MD [phone]

## 2020-11-01 NOTE — ED Triage Notes (Signed)
Patient BIB [facility] for N/V/D x 2 days. Patient c/o SOB and being hot. Per EMS patient appears to be having a panic attack. VSS Patient did have a heart valve replacement last Tuesday and is currently wearing a heart monitor.

## 2020-11-01 NOTE — Telephone Encounter (Signed)
  [facility]   MULTIDISCIPLINARY HEART VALVE TEAM   [place] was discharged last week after a complex admission for acute GI bleeding with anemia and severe AS s/p TAVR c/b transient CHB.   Patient called into our office to complain of nausea and shortness of breath. No blood in stool or urine. No fevers or cough. No weight gain or significant swelling. She does have orthopnea. Has been complaint with all of her medications, including lasix. She is wearing a zio patch which I interrogated and she is in sinus with no HAVB or afib.   She was started on hydralazine and Norvasc during her admission which both have nausea listed as a side effect. Also on amiodarone but that has been a long term med for her and never caused nausea previously. I told her to stop the hydralazine and norvasc to see if that helped nausea. Also called in Zofran. Given dyspnea at rest I recommended she come into the ER but she refused saying she "cannot sit in the ER again for 12 hours again." I have asked her to at least come in for some blood work to assess her Hg, kidney function, electrolytes and BNP. She was agreeable to that. I see her in the office this Thursday for follow up.  Cline Crock PA-C  [facility]

## 2020-11-01 NOTE — Telephone Encounter (Signed)
Called patient back to check in. She is throwing up and can't keep anything down and panting on the phone with shortness of breath. We decided she needs to come into the ER for evaluation. She was agreeable.   Cline Crock PA-C  [facility]

## 2020-11-02 ENCOUNTER — Encounter (HOSPITAL_COMMUNITY): Payer: Self-pay | Admitting: Family Medicine

## 2020-11-02 ENCOUNTER — Inpatient Hospital Stay (HOSPITAL_COMMUNITY): Payer: Medicare Other

## 2020-11-02 DIAGNOSIS — I10 Essential (primary) hypertension: Secondary | ICD-10-CM

## 2020-11-02 DIAGNOSIS — E119 Type 2 diabetes mellitus without complications: Secondary | ICD-10-CM

## 2020-11-02 DIAGNOSIS — N179 Acute kidney failure, unspecified: Secondary | ICD-10-CM

## 2020-11-02 DIAGNOSIS — R778 Other specified abnormalities of plasma proteins: Secondary | ICD-10-CM | POA: Diagnosis not present

## 2020-11-02 DIAGNOSIS — I2510 Atherosclerotic heart disease of native coronary artery without angina pectoris: Secondary | ICD-10-CM

## 2020-11-02 DIAGNOSIS — N183 Chronic kidney disease, stage 3 unspecified: Secondary | ICD-10-CM

## 2020-11-02 DIAGNOSIS — Z952 Presence of prosthetic heart valve: Secondary | ICD-10-CM

## 2020-11-02 DIAGNOSIS — E860 Dehydration: Secondary | ICD-10-CM | POA: Insufficient documentation

## 2020-11-02 DIAGNOSIS — E1169 Type 2 diabetes mellitus with other specified complication: Secondary | ICD-10-CM

## 2020-11-02 DIAGNOSIS — E669 Obesity, unspecified: Secondary | ICD-10-CM

## 2020-11-02 DIAGNOSIS — I480 Paroxysmal atrial fibrillation: Secondary | ICD-10-CM

## 2020-11-02 HISTORY — PX: TRANSTHORACIC ECHOCARDIOGRAM: SHX275

## 2020-11-02 LAB — CBC WITH DIFFERENTIAL/PLATELET
Basophils Absolute: 0 x10E3/uL (ref 0.0–0.2)
Basos: 1 %
EOS (ABSOLUTE): 0.1 x10E3/uL (ref 0.0–0.4)
Eos: 2 %
Hematocrit: 28.9 % — ABNORMAL LOW (ref 34.0–46.6)
Hemoglobin: 9.4 g/dL — ABNORMAL LOW (ref 11.1–15.9)
Immature Grans (Abs): 0 x10E3/uL (ref 0.0–0.1)
Immature Granulocytes: 0 %
Lymphocytes Absolute: 1.5 x10E3/uL (ref 0.7–3.1)
Lymphs: 28 %
MCH: 30.3 pg (ref 26.6–33.0)
MCHC: 32.5 g/dL (ref 31.5–35.7)
MCV: 93 fL (ref 79–97)
Monocytes Absolute: 0.6 x10E3/uL (ref 0.1–0.9)
Monocytes: 11 %
Neutrophils Absolute: 3.3 x10E3/uL (ref 1.4–7.0)
Neutrophils: 58 %
Platelets: 253 x10E3/uL (ref 150–450)
RBC: 3.1 x10E6/uL — ABNORMAL LOW (ref 3.77–5.28)
RDW: 14.6 % (ref 11.7–15.4)
WBC: 5.5 x10E3/uL (ref 3.4–10.8)

## 2020-11-02 LAB — URINALYSIS, ROUTINE W REFLEX MICROSCOPIC
Bilirubin Urine: NEGATIVE
Glucose, UA: NEGATIVE mg/dL
Hgb urine dipstick: NEGATIVE
Ketones, ur: NEGATIVE mg/dL
Leukocytes,Ua: NEGATIVE
Nitrite: NEGATIVE
Protein, ur: NEGATIVE mg/dL
Specific Gravity, Urine: 1.01 (ref 1.005–1.030)
pH: 6 (ref 5.0–8.0)

## 2020-11-02 LAB — COMPREHENSIVE METABOLIC PANEL
ALT: 15 IU/L (ref 0–32)
AST: 27 IU/L (ref 0–40)
Albumin/Globulin Ratio: 1.5 (ref 1.2–2.2)
Albumin: 4 g/dL (ref 3.6–4.6)
Alkaline Phosphatase: 61 IU/L (ref 44–121)
BUN/Creatinine Ratio: 15 (ref 12–28)
BUN: 23 mg/dL (ref 8–27)
Bilirubin Total: 0.7 mg/dL (ref 0.0–1.2)
CO2: 18 mmol/L — ABNORMAL LOW (ref 20–29)
Calcium: 10.2 mg/dL (ref 8.7–10.3)
Chloride: 98 mmol/L (ref 96–106)
Creatinine, Ser: 1.52 mg/dL — ABNORMAL HIGH (ref 0.57–1.00)
Globulin, Total: 2.7 g/dL (ref 1.5–4.5)
Glucose: 148 mg/dL — ABNORMAL HIGH (ref 70–99)
Potassium: 4.2 mmol/L (ref 3.5–5.2)
Sodium: 136 mmol/L (ref 134–144)
Total Protein: 6.7 g/dL (ref 6.0–8.5)
eGFR: 34 mL/min/{1.73_m2} — ABNORMAL LOW (ref >59)

## 2020-11-02 LAB — ECHOCARDIOGRAM COMPLETE
AR max vel: 1.26 cm2
AV Area VTI: 1.37 cm2
AV Area mean vel: 1.31 cm2
AV Mean grad: 9 mmHg
AV Peak grad: 18.7 mmHg
Ao pk vel: 2.17 m/s
Area-P 1/2: 2.95 cm2
Height: 61 in
S' Lateral: 2.3 cm
Weight: 3044.11 oz

## 2020-11-02 LAB — LACTIC ACID, PLASMA
Lactic Acid, Venous: 1 mmol/L (ref 0.5–1.9)
Lactic Acid, Venous: 0.6 mmol/L (ref 0.5–1.9)

## 2020-11-02 LAB — BASIC METABOLIC PANEL
Anion gap: 6 (ref 5–15)
BUN: 21 mg/dL (ref 8–23)
CO2: 26 mmol/L (ref 22–32)
Calcium: 9.4 mg/dL (ref 8.9–10.3)
Chloride: 105 mmol/L (ref 98–111)
Creatinine, Ser: 1.1 mg/dL — ABNORMAL HIGH (ref 0.44–1.00)
GFR, Estimated: 50 mL/min — ABNORMAL LOW (ref >60)
Glucose, Bld: 57 mg/dL — ABNORMAL LOW (ref 70–99)
Potassium: 3.7 mmol/L (ref 3.5–5.1)
Sodium: 137 mmol/L (ref 135–145)

## 2020-11-02 LAB — TROPONIN I (HIGH SENSITIVITY)
Troponin I (High Sensitivity): 191 ng/L (ref <18)
Troponin I (High Sensitivity): 171 ng/L (ref <18)
Troponin I (High Sensitivity): 177 ng/L (ref <18)

## 2020-11-02 LAB — CBC
HCT: 24.6 % — ABNORMAL LOW (ref 36.0–46.0)
Hemoglobin: 8 g/dL — ABNORMAL LOW (ref 12.0–15.0)
MCH: 31 pg (ref 26.0–34.0)
MCHC: 32.5 g/dL (ref 30.0–36.0)
MCV: 95.3 fL (ref 80.0–100.0)
Platelets: 201 10*3/uL (ref 150–400)
RBC: 2.58 MIL/uL — ABNORMAL LOW (ref 3.87–5.11)
RDW: 15.1 % (ref 11.5–15.5)
WBC: 4.7 10*3/uL (ref 4.0–10.5)
nRBC: 0 % (ref 0.0–0.2)

## 2020-11-02 LAB — PRO B NATRIURETIC PEPTIDE: NT-Pro BNP: 993 pg/mL — ABNORMAL HIGH (ref 0–738)

## 2020-11-02 LAB — MAGNESIUM: Magnesium: 1.5 mg/dL — ABNORMAL LOW (ref 1.7–2.4)

## 2020-11-02 LAB — PHOSPHORUS: Phosphorus: 4.5 mg/dL (ref 2.5–4.6)

## 2020-11-02 MED ORDER — MAGNESIUM SULFATE 2 GM/50ML IV SOLN
2.0000 g | Freq: Once | INTRAVENOUS | Status: AC
  Administered 2020-11-02: 2 g via INTRAVENOUS
  Filled 2020-11-02: qty 50

## 2020-11-02 MED ORDER — PERFLUTREN LIPID MICROSPHERE
1.0000 mL | INTRAVENOUS | Status: AC | PRN
  Administered 2020-11-02: 2 mL via INTRAVENOUS
  Filled 2020-11-02: qty 10

## 2020-11-02 MED ORDER — AMLODIPINE BESYLATE 10 MG PO TABS
10.0000 mg | ORAL_TABLET | Freq: Every day | ORAL | Status: DC
  Administered 2020-11-02 – 2020-11-04 (×3): 10 mg via ORAL
  Filled 2020-11-02 (×3): qty 1

## 2020-11-02 MED ORDER — POTASSIUM CHLORIDE CRYS ER 20 MEQ PO TBCR
40.0000 meq | EXTENDED_RELEASE_TABLET | Freq: Once | ORAL | Status: AC
  Administered 2020-11-02: 40 meq via ORAL
  Filled 2020-11-02: qty 2

## 2020-11-02 MED ORDER — ACETAMINOPHEN 325 MG PO TABS
650.0000 mg | ORAL_TABLET | Freq: Four times a day (QID) | ORAL | Status: DC | PRN
  Administered 2020-11-04: 650 mg via ORAL
  Filled 2020-11-02: qty 2

## 2020-11-02 MED ORDER — ONDANSETRON HCL 4 MG/2ML IJ SOLN: 4.0000 mg | Freq: Four times a day (QID) | INTRAMUSCULAR | Status: DC | PRN

## 2020-11-02 MED ORDER — LACTATED RINGERS IV SOLN: INTRAVENOUS | Status: DC

## 2020-11-02 MED ORDER — PANTOPRAZOLE SODIUM 40 MG PO TBEC
40.0000 mg | DELAYED_RELEASE_TABLET | Freq: Two times a day (BID) | ORAL | Status: DC
  Administered 2020-11-02 – 2020-11-04 (×6): 40 mg via ORAL
  Filled 2020-11-02 (×6): qty 1

## 2020-11-02 MED ORDER — MAGNESIUM OXIDE -MG SUPPLEMENT 400 (240 MG) MG PO TABS
400.0000 mg | ORAL_TABLET | Freq: Two times a day (BID) | ORAL | Status: AC
  Administered 2020-11-02 – 2020-11-03 (×4): 400 mg via ORAL
  Filled 2020-11-02 (×4): qty 1

## 2020-11-02 MED ORDER — ACETAMINOPHEN 650 MG RE SUPP: 650.0000 mg | Freq: Four times a day (QID) | RECTAL | Status: DC | PRN

## 2020-11-02 MED ORDER — ATORVASTATIN CALCIUM 40 MG PO TABS
40.0000 mg | ORAL_TABLET | Freq: Every day | ORAL | Status: DC
  Administered 2020-11-02 – 2020-11-04 (×3): 40 mg via ORAL
  Filled 2020-11-02 (×3): qty 1

## 2020-11-02 MED ORDER — APIXABAN 5 MG PO TABS
5.0000 mg | ORAL_TABLET | Freq: Two times a day (BID) | ORAL | Status: DC
  Administered 2020-11-02 – 2020-11-04 (×6): 5 mg via ORAL
  Filled 2020-11-02 (×6): qty 1

## 2020-11-02 MED ORDER — METFORMIN HCL 500 MG PO TABS: 1000.0000 mg | ORAL_TABLET | Freq: Every day | ORAL | Status: DC

## 2020-11-02 MED ORDER — FUROSEMIDE 40 MG PO TABS: 40.0000 mg | ORAL_TABLET | Freq: Every day | ORAL | Status: DC

## 2020-11-02 MED ORDER — METFORMIN HCL 500 MG PO TABS: 500.0000 mg | ORAL_TABLET | Freq: Every day | ORAL | Status: DC

## 2020-11-02 MED ORDER — ONDANSETRON HCL 4 MG PO TABS: 4.0000 mg | ORAL_TABLET | Freq: Four times a day (QID) | ORAL | Status: DC | PRN

## 2020-11-02 MED ORDER — LORAZEPAM 2 MG/ML IJ SOLN
0.5000 mg | INTRAMUSCULAR | Status: AC | PRN
  Administered 2020-11-02 – 2020-11-03 (×3): 0.5 mg via INTRAVENOUS
  Filled 2020-11-02 (×3): qty 1

## 2020-11-02 MED ORDER — METFORMIN HCL 500 MG PO TABS: 500.0000 mg | ORAL_TABLET | Freq: Two times a day (BID) | ORAL | Status: DC

## 2020-11-02 MED ORDER — AMIODARONE HCL 200 MG PO TABS
200.0000 mg | ORAL_TABLET | Freq: Every day | ORAL | Status: DC
  Administered 2020-11-02 – 2020-11-04 (×3): 200 mg via ORAL
  Filled 2020-11-02 (×3): qty 1

## 2020-11-02 MED ORDER — METOPROLOL SUCCINATE ER 25 MG PO TB24
12.5000 mg | ORAL_TABLET | Freq: Every day | ORAL | Status: DC
  Administered 2020-11-03 – 2020-11-04 (×2): 12.5 mg via ORAL
  Filled 2020-11-02 (×2): qty 1

## 2020-11-02 NOTE — Consult Note (Addendum)
Cardiology Consultation:   Patient ID: Mary Mccarthy MRN: [id number]; DOB: [birth year]  Admit date: 11/01/2020 Date of Consult: 11/02/2020  PCP:  Biagio Borg, MD   [facility] Providers Cardiologist:  Glenetta Hew, MD   {    Patient Profile:   Mary Mccarthy is a 80 y.o. female with a hx of  obesity, type 2 DM, non-obstructive CAD,  hx of SVT, paroxysmal A fib on amiodarone and Eliquis (initially diagnosed at the time of acute CVA in 08/2020), HTN, pulmonary HTN, chronic diastolic CHF, and severe aortic stenosis s/p TAVR on 10/26/20, PUD/AVM on 2017 GI scope, who is being seen 11/02/2020 for the evaluation of elevated trop and  at the request of Dr. Starla Link.   History of Present Illness:   Ms. Mary Mccarthy follows Dr Ellyn Hack outpatient historically for above cardiac disease.   She had hx of worsening anemia with Hgb 5 in 2017, GI workup including EGD and colonoscopy at the time showed AVMs and peptic ulcer disease without active bleeding.   She had recent admission on July 6256 for embolic ischemic CVA and was found to have new onset A fib. Echo at that time showed EF 55 to 60%, moderately elevated pulmonary hypertension, severe concentric LVH, severe mitral annular calcification and severe aortic stenosis with a mean gradient of 42 mmHg, peak gradient of 76 mmHg, AVA of 0.73 cm, DVI 0.27. She was started on Eliquis.   She was re-admitted on August 2022 for acute on chronic diastolic CHF in the setting of severe aortic stenosis, A fib RVR. She was diuresed with IV Lasix and given amiodarone with subsequent conversion of A fib. She was seen by structural heart team. Left and right heart cath on 09/29/20 showed mild nonobstructive CAD (prox RCA 20% stenosis, mid to dist RCA 20% stenosis, ramus 30% stenosis, mid LAD 20% stenosis, and dis LM to prox LAD 20% stenosis) with severe AS with a mean gradient of 52.8 mmHG, peak to peak gradient 58 mmHg, AVA 0.90 cm2. Cardiac gated CTA of the heart revealed  anatomical characteristics consistent with aortic stenosis suitable for treatment by transcatheter aortic valve replacement without any significant complicating features and CTA of the aorta and iliac vessels demonstrated what appear to be adequate pelvic vascular access to facilitate a transfemoral approach.   She was scheduled to see Dr. Cyndia Bent in the office on 10/20/2020 as a part of the multidisciplinary valve team approach.  However, she was readmitted to the hospital on 10/19/20 for acute dyspnea and GI bleeding with black tarry stool. Her Hgb was down to 7.4 required 1 unit PRBC transfusion, GI consulted, she underwent EGD  9/15 showed gastritis with no signs of active bleeding but stigmata of recent bleeding. She was recommend PPI BID for 8 weeks and cleared to resume Eliquis by GI. She ws discharged on 10/28/20 with Hgb of 8.3. She did have A fib RVR episodes required IV amiodarone gtt, had subsequent conversion to SR, and discharged on amiodarone $RemoveBefor'200mg'MvTCQAvnuacx$  daily. She underwent successful TAVR with a 26 mm Medtronic Evolut Pro + THV via the TF approach on 10/26/20. Post operative Echo on 10/27/20 showed EF 60%, normally functioning TAVR with a mean gradient of 12 mmHg and trivial PVL. She was discharged on Eliquis without antiplatelet due to recent GI bleed. She did develop complete heart block intraoperatively where temp wire was left initially, it was removed after she regained her previous intrinsic rhythm of sinus brady in 69s. Home med Coreg was discontinued due to  bradycardia and CHB, she was placed on Zio AT at discharge for monitor. Lastly her lasix was increased from 20 to $Rem'40mg'ePXs$  daily for chronic diastolic CHF.   Patient presented to ER 11/01/20 at [facility] for c/o nausea, vomiting, unable to tolerating any PO intake, and diarrhea. She states she was shaking a lot yesterday due to anxiety. She states she was able to ambulate and perform ADLs independently at home since discharge on 10/28/20. She reports chronic  SOB at rest and with exertion,mild intermittent dry cough, and chronic orthopnea,   that seems not much changed from her baseline. She said her PCP had recommend oxygen support few weeks ago for her SOB, but she has not been evaluated by home oxygen agency yet. She states she was not released on oxygen from last hospitalization. She states she was constipated for a long time and was given laxative last hospitalization she took extra after discharge at home, and eventually started having loose stool. She states she starting feeling nausea with emesis Sat  10/30/20, symptoms progressively got worsened, yesterday she had 7 episodes of lose stool. She felt her medications were to much and it's causing her nausea. She was not able to tolerate any PO intake yesterday, but did take her medications. She states she did notice any blood in her stool. She was urinating as usual, her weight was 195 >194 over the past 2 days, denied any chest pain, angina, dizziness, syncope, leg edema, fever, abdominal pain, productive cough, or dysuria.   Admission diagnostic showed elevated Cr 1.53 and GFR 34, bicarb 18. NT-pro BNP 993. BNP 124.  Hs trop 211> 191 >171 >177.  Lactic acid 2.6. Hgb 9.4/8.9, CBC diff otherwise unremarkable. INR 1.7. UA unremarkable. FOBT negative. CXR showed No active disease. Mild cardiomegaly. EKG from 11/01/20 at 1942 showed sinus rhythm with ventricular rate of 84bpm, new onset TWI of inferolateral leads, mild STE of V1-2 query repolarization abnormalities , comparing to EKG from 10/27/20. She was afebrile, AP 70-80s, hypertensive with BP up to 172/66 at ED, and placed on 2LNC oxygen with pox 100%. She was admitted to hospital medicine service for AKI, started on IVF LR at 34ml/hr, repeat lactic acid normalized. Cardiology consult is requested today for elevated trop and recent TAVR.     Past Medical History:  Diagnosis Date   ALLERGIC RHINITIS 06/24/2007   Anemia    AVM (arteriovenous malformation) of  colon    Blood transfusion without reported diagnosis 05/2015   COLONIC POLYPS, HX OF 06/24/2007   Coronary artery disease, non-occlusive 05/2015   Trop + w/ Acute Anemia =>CATH: small RI - Ostial 60%, ostial RCA 30% and dLAD 40-50%;; 9/'21: Cor Ca Score 624. Mild (25-49%) prox RCA & LAD,; Moderate (50-69%) Ostial Small RI & prox LCx.     DEPRESSION 10/08/2006   DIABETES MELLITUS, TYPE II 10/05/2006   GERD 06/24/2007   History of CVA (cerebrovascular accident)    08/2020- found to be in afib with RVR, started on Eliquis   HYPERLIPIDEMIA 10/08/2006   HYPERTENSION 10/08/2006   INSOMNIA 09/24/2008   Left knee DJD    Osteoporosis 03/10/2016   PAF (paroxysmal atrial fibrillation) ([place])    PEPTIC ULCER DISEASE 10/08/2006   S/P TAVR (transcatheter aortic valve replacement) 10/26/2020   s/p TAVR with a 26 mm Medtronic Evolut Pro+ via the TF approach by Dr. Angelena Form & Dr. Cyndia Bent   Severe aortic stenosis     Past Surgical History:  Procedure Laterality Date   APPENDECTOMY  BIOPSY  10/21/2020   Procedure: BIOPSY;  Surgeon: Sharyn Creamer, MD;  Location: [facility] ENDOSCOPY;  Service: Gastroenterology;;   BREAST BIOPSY     CARDIAC CATHETERIZATION N/A 05/21/2015   Procedure: Left Heart Cath and Coronary Angiography;  Surgeon: Leonie Man, MD;  Location: [place] CV LAB;  Service: Cardiovascular;: Ost RI 60%, Ost RCA 30%, dLAD tapers to small vessel w/ 40-50%. Mildly elevated LVEDP. Normal LV Fxn.   COLONOSCOPY N/A 05/20/2015   Procedure: COLONOSCOPY;  Surgeon: Manus Gunning, MD;  Location: [place];  Service: Gastroenterology;  Laterality: N/A;   CORONARY CA2+ SCORE / CARDIAC CT ANGIOGRAM  10/09/2019   Calcium score 624.  82nd percentile. Dominant RCA: Mild (25-49%) proximal stenosis-distal bifurcation into PDA and PAV--< RPL branches.  LAD (1 major mid vessel diagonal) diffuse calcified plaque, mild proximal stenosis with minimal distal stenosis.  Small RI moderate ostial disease.   LCx-moderate mixed (50-69%) proximal stenosis.  Small dOM1 disease.  Trileaflet AoV, annular Ca2+ - probable AS   ESOPHAGOGASTRODUODENOSCOPY N/A 05/20/2015   Procedure: ESOPHAGOGASTRODUODENOSCOPY (EGD);  Surgeon: Manus Gunning, MD;  Location: [place];  Service: Gastroenterology;  Laterality: N/A;   ESOPHAGOGASTRODUODENOSCOPY (EGD) WITH PROPOFOL N/A 10/21/2020   Procedure: ESOPHAGOGASTRODUODENOSCOPY (EGD) WITH PROPOFOL;  Surgeon: Sharyn Creamer, MD;  Location: [place];  Service: Gastroenterology;  Laterality: N/A;   INTRAOPERATIVE TRANSTHORACIC ECHOCARDIOGRAM N/A 10/26/2020   Procedure: INTRAOPERATIVE TRANSTHORACIC ECHOCARDIOGRAM;  Surgeon: Burnell Blanks, MD;  Location: [place];  Service: Open Heart Surgery;  Laterality: N/A;   RIGHT/LEFT HEART CATH AND CORONARY ANGIOGRAPHY N/A 09/29/2020   Procedure: RIGHT/LEFT HEART CATH AND CORONARY ANGIOGRAPHY;  Surgeon: Burnell Blanks, MD;  Location: [place] CV LAB;  Service: Cardiovascular;  Laterality: N/A;   TONSILLECTOMY     TRANSCATHETER AORTIC VALVE REPLACEMENT, TRANSFEMORAL N/A 10/26/2020   Procedure: TRANSCATHETER AORTIC VALVE REPLACEMENT, TRANSFEMORAL;  Surgeon: Burnell Blanks, MD;  Location: [place];  Service: Open Heart Surgery;  Laterality: N/A;   TRANSTHORACIC ECHOCARDIOGRAM  03/2019; 09/2019   a) EF 60 to 65%.  Moderate LVH.  GRII DD.  Mod-Severe AS (m grad 36 mmHg, peak 59 mmHg); b) EF 65-70%, No RWMA. Gr1 DD/hi LAP, Mild hi PAP. Mod LA Dil. MOD AS (mean Grad 34.5 mmHg).  STABLE    TRANSTHORACIC ECHOCARDIOGRAM  04/12/2017   EF 60-65%. No RWMA.  GR 1 DD.  Moderate concentric LVH.  Mild LA dilation. Severe calcified aortic valve with moderate-severe aortic stenosis (peak/mean gradients 60/34 mmHg) and in severe range by AVA (0.8 cm2).  Mild to moderately increased PA pressures 41 mmHg with normal RV size and function..     TUBAL LIGATION       Home Medications:  Prior to Admission medications   Medication  Sig Start Date End Date Taking? Authorizing Provider  acetaminophen (TYLENOL) 500 MG tablet Take 500 mg by mouth every 6 (six) hours as needed for moderate pain.   Yes [provider]  ALPRAZolam (XANAX) 0.25 MG tablet Take 1 tablet (0.25 mg total) by mouth 3 (three) times daily as needed for anxiety. 10/06/20  Yes Biagio Borg, MD  amiodarone (PACERONE) 200 MG tablet Take 1 tablet (200 mg total) by mouth daily. 10/28/20  Yes Eileen Stanford, PA-C  apixaban (ELIQUIS) 5 MG TABS tablet Take 1 tablet (5 mg total) by mouth 2 (two) times daily. 08/19/20  Yes Biagio Borg, MD  atorvastatin (LIPITOR) 40 MG tablet TAKE 1 TABLET BY MOUTH EVERY DAY Patient taking  differently: Take 40 mg by mouth daily. 08/20/20  Yes Leonie Man, MD  cyanocobalamin ([employer] VITAMIN B12) 1000 MCG tablet Take 1 tablet (1,000 mcg total) by mouth daily. 07/14/20  Yes Biagio Borg, MD  furosemide (LASIX) 40 MG tablet Take 1 tablet (40 mg total) by mouth daily. 10/28/20  Yes Eileen Stanford, PA-C  glimepiride (AMARYL) 2 MG tablet TAKE 1 TABLET BY MOUTH EVERY DAY BEFORE BREAKFAST Patient taking differently: Take 2 mg by mouth daily with breakfast. 09/16/20  Yes Biagio Borg, MD  metFORMIN (GLUCOPHAGE) 1000 MG tablet TAKE 1 TAB BY MOUTH IN THE AM,AND 1/2 TAB IN THE PM Patient taking differently: Take 500-1,000 mg by mouth 2 (two) times daily with a meal. Take $RemoveBe'1000mg'dpRuvMLpv$  in the AM and 500 in the PM 07/14/20  Yes Biagio Borg, MD  ondansetron (ZOFRAN) 4 MG tablet Take 1 tablet (4 mg total) by mouth every 8 (eight) hours as needed for nausea or vomiting. 11/01/20   Eileen Stanford, PA-C  pantoprazole (PROTONIX) 40 MG tablet Take 1 tablet (40 mg total) by mouth 2 (two) times daily. 10/28/20  Yes Eileen Stanford, PA-C  potassium chloride SA (KLOR-CON) 20 MEQ tablet Take 1 tablet (20 mEq total) by mouth daily. 10/28/20  Yes Eileen Stanford, PA-C  amLODipine (NORVASC) 10 MG tablet Take 1 tablet (10 mg total) by mouth  daily. Patient not taking: Reported on 11/01/2020 10/28/20   Eileen Stanford, PA-C  blood glucose meter kit and supplies Dispense based on patient and insurance preference. Use up to four times daily as directed. E11.9 04/15/18   Biagio Borg, MD  Blood Glucose Monitoring Suppl (ONE TOUCH ULTRA 2) w/Device KIT Use as directed daily 06/01/15   Biagio Borg, MD  hydrALAZINE (APRESOLINE) 100 MG tablet Take 1 tablet (100 mg total) by mouth every 8 (eight) hours. Patient not taking: No sig reported 10/28/20   Angelena Form R, PA-C  Lancets [facility] DELICA PLUS [id number]) MISC USE AS DIRECTED TEST 1-2 TIMES A DAY 09/05/20   Biagio Borg, MD  [facility] ULTRA test strip USE AS INSTRUCTED TEST 1-2 TIMES A DAY 10/12/20   Biagio Borg, MD    Inpatient Medications: Scheduled Meds:  amiodarone  200 mg Oral Daily   amLODipine  10 mg Oral Daily   apixaban  5 mg Oral BID   atorvastatin  40 mg Oral Daily   metoprolol succinate  12.5 mg Oral Daily   pantoprazole  40 mg Oral BID   Continuous Infusions:  lactated ringers 75 mL/hr at 11/02/20 0315   PRN Meds: acetaminophen **OR** acetaminophen, LORazepam, ondansetron **OR** ondansetron (ZOFRAN) IV  Allergies:    Allergies  Allergen Reactions   Ibuprofen Other (See Comments)    Bleeding events    Social History:   Social History   Socioeconomic History   Marital status: Widowed    Spouse name: Not on file   Number of children: 1   Years of education: Not on file   Highest education level: Not on file  Occupational History   Occupation: retired - Social research officer, government: RETIRED  Tobacco Use   Smoking status: Never   Smokeless tobacco: Never  Vaping Use   Vaping Use: Never used  Substance and Sexual Activity   Alcohol use: No    Alcohol/week: 0.0 standard drinks   Drug use: No   Sexual activity: Not on file  Other Topics Concern   Not  on file  Social History Narrative   Recently widowed--husband died in 11/25/18.       Now lives alone but her son usually stays with her at night.  During the day she has 2 nephews who live nearby to come in and check on her intermittently.  Also one of her nieces calls routinely.      When her son is home and awake, she may try to walk on a treadmill, but is scared to walk outside.   Social Determinants of Health   Financial Resource Strain: Not on file  Food Insecurity: Not on file  Transportation Needs: Not on file  Physical Activity: Not on file  Stress: Not on file  Social Connections: Not on file  Intimate Partner Violence: Not on file    Family History:    Family History  Problem Relation Age of Onset   Alcohol abuse Other    Arthritis Other        DJD   Hyperlipidemia Other    Heart disease Other    Stroke Other    Hypertension Other    Depression Other    Diabetes Other    Cancer Mother        ENT cancer   CAD Sister        Several MI & PPM; long term smoker, EtOH   Heart attack Sister 55       several   Arrhythmia Sister        s/p PPM (Dr. Sallyanne Kuster)     ROS:  Constitutional: see HPI  Eyes: Denied vision change or loss Ears/Nose/Mouth/Throat: Denied ear ache, sore throat, coughing, sinus pain Cardiovascular: see HPI  Respiratory: see HPI  Gastrointestinal: see HPI  Genital/Urinary: Denied dysuria, hematuria, urinary frequency/urgency Musculoskeletal: Denied muscle ache, joint pain, weakness Skin: Denied rash, wound Neuro: see HPI  Psych: history of depression/anxiety  Endocrine: history of diabetes   Physical Exam/Data:   Vitals:   11/02/20 0040 11/02/20 0320 11/02/20 0830 11/02/20 0858  BP: (!) 156/46 (!) 115/40 (!) 87/64 (!) 151/45  Pulse: 80 77 75 76  Resp: $Remo'18 18  20  'BwuPX$ Temp: 97.7 F (36.5 C) 98 F (36.7 C) 98.6 F (37 C) 97.7 F (36.5 C)  TempSrc: Oral Oral Oral Oral  SpO2: 100% 100% 98% 98%  Weight: 86.3 kg     Height:        Intake/Output Summary (Last 24 hours) at 11/02/2020 1040 Last data filed at 11/02/2020  0153 Gross per 24 hour  Intake 500 ml  Output 200 ml  Net 300 ml   Last 3 Weights 11/02/2020 11/01/2020 10/28/2020  Weight (lbs) 190 lb 4.1 oz 194 lb 197 lb 5 oz  Weight (kg) 86.3 kg 87.998 kg 89.5 kg     Body mass index is 35.95 kg/m.   Vitals:  Vitals:   11/02/20 0830 11/02/20 0858  BP: (!) 87/64 (!) 151/45  Pulse: 75 76  Resp:  20  Temp: 98.6 F (37 C) 97.7 F (36.5 C)  SpO2: 98% 98%   General Appearance: In no apparent distress, laying in bed, obese  HEENT: Normocephalic, atraumatic. EOMs intact.  Neck: Supple, trachea midline, JVD 8-9cm with HOB 45 degree  Cardiovascular: Regular rate and rhythm, normal V2-Y2,  systolic murmur grade II throughout  Respiratory: Resting breathing unlabored, mild DOE, lungs sounds clear but diminished to auscultation bilaterally, no use of accessory muscles. On 2LNC Gastrointestinal: Bowel sounds positive, abdomen soft, non-tender, non-distended.  Extremities: Able to move  all extremities in bed without difficulty, no edema/cyanosis/clubbing, DP/PT pulses were 2+ and equal bilaterally Genitourinary: external suction Foley in place, urine amber colored  Musculoskeletal: Normal muscle bulk and tone, no focal weakness Skin: Intact, warm, dry.  Neurologic: Alert, oriented to person, place and time. Fluent speech, no cognitive deficit, no gross focal neuro deficit Psychiatric: Mild anxiety  Bilateral groin puncture sites healed with small bruise noted, femoral and dorsalis pedalis 2+ bilaterally, BLE warm to touch, no gross sensory or motor deficit    EKG:  The EKG was personally reviewed and demonstrates:  EKG from 11/01/20 at 1942 showed sinus rhythm with ventricular rate of 84bpm, new onset TWI of inferolateral leads, mild STE of V1-2 query repolarization abnormalities , comparing to EKG from 10/27/20.  Telemetry: Patient was not on telemetry upon consult, order placed for telemetry monitor    Relevant CV Studies:  Echo from 10/27/20:   1.  Left ventricular ejection fraction, by estimation, is 60 to 65%. The  left ventricle has normal function. The left ventricle has no regional  wall motion abnormalities. There is mild left ventricular hypertrophy.  Left ventricular diastolic parameters  are indeterminate.   2. Right ventricular systolic function is normal. The right ventricular  size is normal.   3. Left atrial size was severely dilated.   4. Right atrial size was mildly dilated.   5. The mitral valve is degenerative. Mild mitral valve regurgitation.  Severe mitral annular calcification.   6. Post TAVR with 26 mm supra annular Medtronic Evolut Pro valve trivial  PvL mean gradient 12 peak 24 mmHg AVA 1.8 cm2 with DVI 0.63. The aortic  valve has been repaired/replaced. Aortic valve regurgitation is trivial.  Procedure Date: 10/26/2020.   Right and left heart cath on 09/29/20:    Prox RCA lesion is 20% stenosed.   Mid RCA to Dist RCA lesion is 20% stenosed.   Ramus lesion is 30% stenosed.   Mid LAD lesion is 20% stenosed.   Dist LM to Prox LAD lesion is 20% stenosed.   Mild non-obstructive CAD Severe aortic stenosis (mean gradient 52.8 mmHg, peak to peak gradient 58 mmHg, AVA 0.90 cm2).    Recommendations: Will continue workup for TAVR    Laboratory Data:  High Sensitivity Troponin:   Recent Labs  Lab 10/19/20 1326 11/01/20 2023 11/02/20 0058 11/02/20 0533 11/02/20 0734  TROPONINIHS 45* 211* 191* 171* 177*     Chemistry Recent Labs  Lab 10/27/20 0454 10/28/20 0132 11/01/20 1203 11/01/20 2023 11/02/20 0533 11/02/20 0734  NA 135 133* 136 140 137  --   K 4.3 4.4 4.2 3.8 3.7  --   CL 101 102 98 107 105  --   CO2 24 26 18* 20* 26  --   GLUCOSE 124* 167* 148* 73 57*  --   BUN 20 19 23  24* 21  --   CREATININE 1.07* 1.25* 1.52* 1.41* 1.10*  --   CALCIUM 9.2 9.3 10.2 10.6* 9.4  --   MG 1.7  --   --   --   --  1.5*  GFRNONAA 52* 43*  --  37* 50*  --   ANIONGAP 10 5  --  13 6  --     Recent Labs  Lab  11/01/20 1203 11/01/20 2023  PROT 6.7 7.1  ALBUMIN 4.0 3.6  AST 27 32  ALT 15 21  ALKPHOS 61 49  BILITOT 0.7 1.0   Lipids No results for input(s): CHOL, TRIG, HDL,  LABVLDL, LDLCALC, CHOLHDL in the last 168 hours.  Hematology Recent Labs  Lab 11/01/20 1203 11/01/20 2023 11/02/20 0533  WBC 5.5 5.5 4.7  RBC 3.10* 2.89* 2.58*  HGB 9.4* 8.9* 8.0*  HCT 28.9* 27.0* 24.6*  MCV 93 93.4 95.3  MCH 30.3 30.8 31.0  MCHC 32.5 33.0 32.5  RDW 14.6 14.8 15.1  PLT 253 234 201   Thyroid No results for input(s): TSH, FREET4 in the last 168 hours.  BNP Recent Labs  Lab 11/01/20 1203 11/01/20 2023  BNP  --  124.6*  PROBNP 993*  --     DDimer No results for input(s): DDIMER in the last 168 hours.   Radiology/Studies:  DG Chest Port 1 View  Result Date: 11/01/2020 CLINICAL DATA:  Weakness nausea vomiting EXAM: PORTABLE CHEST 1 VIEW COMPARISON:  10/26/2020 FINDINGS: Electronic device over the left chest. Redemonstrated TAVR. No focal opacity, pleural effusion or pneumothorax. Mild cardiomegaly. IMPRESSION: No active disease.  Mild cardiomegaly Electronically Signed   By: Donavan Foil M.D.   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 due to N/V/D Non anion gap metabolic acidosis, resolved  Lactic acidosis, resolved  - presented with 3 days worsening N/V/D, symptoms improved today - Cr 1.52 POA, baseline 1-1.2, now 1.1 after IVF hydration  - will hold Lasix today, trend BMP daily, GI workup if persistent symptoms per IM   Elevated troponin Non-obstructive CAD  - she denied any chest pain, angina symptoms - cardiac cath 09/29/20 with Non-obstructive CAD  - Hs trop 211 >191 >171 >177 - EKG at admission with SR,  new onset TWI of inferolateral leads and mild STE on V1-2 concerning repo abnormalities  - CXR no acute findings POA  - will place on telemetry monitor and repeat EKG today  - suspect demand ischemia  - continue medical therapy with Lipitor  40mg , not on ASA due t recent GIB and eliquis use, not on BB due to CHB and SB  Hx of severe aortic stenosis s/p TAVR 10/26/20 - no significant worsening of chronic DOE, orthopnea, weight is stable, not hypoxic but on [facility]  - clinically without significant volume overload today  - Post operative Echo on 10/27/20 showed EF 60%, normally functioning TAVR with a mean gradient of 12 mmHg and trivial PVL. - will repeat Echo today for interval evaluation   NSVT Paroxysmal A fib with RVR  Hx of transient post-TAVR complete heart block  - Zio monitor reports intermittent A fib RVR with ventricular rate up to 130s and NSVT lasting up to 9 runs  - will repeat EKG today and place her on telemetry monitor - will check Mag and Phos, replete electrolytes as needed, see below  - continue gentle IVF for AKI  - will start low dose metoprolol XL 12.5mg  today (was on Coreg and it ws stopped last hospitalization) - continue amiodarone 200mg  daily  - continue Eliquis   Hypomagnesemia - Mag 1.5, K 3.7 , phos 4.5 today - will replete , goal of Mag >2 and K >4  HTN  - BP is elevated since ER, had one low BP on records at 80s - will hold lasix and hydralazine today, resume amlodipine 10mg , start metoprolol XL 12.5mg  daily today  - trend BP   Chronic diastolic heart failure  - clinically dehydrated  - IVF gentle hydration, hold lasix  - GDMT: start metoprolol XL 12.5mg  daily, no ACEI/ARB given AKI now  Hx of blood loss anemia with recent GI bleed 10/2020 Hx of AVM and PUD from GI scope on 2017 - Hgb remains low 8-9, no overt GI bleed, FOBT negative - she was cleared by GI 10/21/20 for anticoagulation with Eliquis, will continue   Type 2 DM Anxiety Gastritis  Hx of ischemic CVA  - managed per IM       Risk Assessment/Risk Scores:     New York Heart Association (NYHA) Functional Class NYHA Class II  CHA2DS2-VASc Score = 8  This indicates a 10.8% annual risk of stroke. The patient's score is  based upon: CHF History: 0 HTN History: 1 Diabetes History: 1 Stroke History: 2 Vascular Disease History: 1 Age Score: 2 Gender Score: 1     For questions or updates, please contact [place] Please consult [email or web address] for contact info under    Signed, Margie Billet, NP  11/02/2020 10:40 AM  Patient seen and examined with Margie Billet NP.  Agree as above, with the following exceptions and changes as noted below.  Ms. Feltz had recent TAVR 10/26/2020, and after several days at home had nausea, vomiting, poor p.o. intake, and difficulty keeping any food down.  Presented with mild renal insufficiency which responded well to IV hydration.  Mild troponin elevation which is likely nonspecific given no chest pain or shortness of breath.  Repeat echo performed today with no significant change from prior post procedure echo.  I have independently reviewed these images and see that there may be trivial to mild perivalvular regurgitation, and otherwise no worrisome features of the prosthetic aortic valve.  Gen: NAD, CV: RRR, soft mid peaking systolic murmur, Lungs: clear, Abd: soft, Extrem: Warm, well perfused, no edema, Neuro/Psych: alert and oriented x 3, normal mood and affect. All available labs, radiology testing, previous records reviewed.  She appears to be recovering well but has had atrial fibrillation and nonsustained ventricular tachycardia on telemetry monitor placed at discharge from TAVR.  Transient complete heart block noted intraprocedure early, on review with structural heart team this may have been prior to valve deployment suggesting procedural factors more so than issues with AV node conduction status post valve deployment.  Her beta-blocker was held on dismissal for this reason.  She is at elevated ventricular rates in atrial fibrillation since going home.  Electrolytes reviewed and note low magnesium which we will replete.  Recommend repletion of potassium as well.  She is received IV  hydration at 75 cc an hour, with improvement in AKI, would consider stopping this no later than tomorrow a.m. especially since she is attempting to eat and drink today and has chronic diastolic heart failure. Hemoglobin remains low, will follow closely while in hospital.  We have resumed her metoprolol for beta-blockade for fast rates in atrial fibrillation.  I recommend telemetry monitoring for 1 to 2 days to evaluate for complete heart block.  If she tolerates metoprolol well and ambulates easily with no rhythm disturbance, she can likely be discharged in a few days if all other factor stable and continue wearing her outpatient ZIO monitor which will also screen for complete heart block.  No other changes to therapy at this time.  Findings discussed in detail with the patient who feels relieved that her valve seems stable at this time.  Elouise Munroe, MD 11/02/20 4:33 PM

## 2020-11-02 NOTE — Progress Notes (Signed)
Pt has been admitted to 1503 from the ED. Pt wearing a ZIO AT  heart monitor  attached to L ant chest to be removed 11/20/20. Pt has been oriented to the room.

## 2020-11-02 NOTE — Plan of Care (Signed)
Pt admitted with AKI after 3+ days N/V/Diarrhea at home, much improved since arrival to hosp

## 2020-11-02 NOTE — Progress Notes (Signed)
Inpatient Diabetes Program Recommendations  AACE/ADA: New Consensus Statement on Inpatient Glycemic Control   Target Ranges:  Prepandial:   less than 140 mg/dL      Peak postprandial:   less than 180 mg/dL (1-2 hours)      Critically ill patients:  140 - 180 mg/dL   Results for Mary Mccarthy, Mary Mccarthy (MRN [id number]) as of 11/02/2020 13:38  Ref. Range 11/01/2020 12:03 11/01/2020 20:23 11/02/2020 05:33  Glucose Latest Ref Range: 70 - 99 mg/dL 416 (H) 73 57 (L)   Results for Mary Mccarthy, Mary Mccarthy (MRN [id number]) as of 11/02/2020 13:38  Ref. Range 08/10/2020 16:18 10/26/2020 03:42  Hemoglobin A1C Latest Ref Range: 4.8 - 5.6 % 7.4 (H) 5.8 (H)  Results for Mary Mccarthy, Mary Mccarthy (MRN [id number]) as of 11/02/2020 13:38  Ref. Range 10/26/2020 03:42  Hemoglobin Latest Ref Range: 12.0 - 15.0 g/dL 9.3 (L)   Review of Glycemic Control  Diabetes history: DM2 Outpatient Diabetes medications: Amaryl 2 mg QAM, Metformin 1000 QAM, 500 QPM.  Current orders for Inpatient glycemic control: None  Inpatient Diabetes Program Recommendations:    Outpatient DM meds: A1C now 5.8% (10/26/20) and was 7.4% on 08/10/20. Noted hemoglobin low but with noted hypoglycemia, likely need to d/c Amaryl as an outpatient.  NOTE:  Admitted with AKI, recently had aortic valve replacement surgery and was inpatient 09/27/20-10/28/20; only had Novolog correction ordered during hospitalization.  Noted CBGs trending low with glucose of 57 mg/dl  today. No current orders for glucose control as an inpatient and no DM medications given since admitted.  Thanks, Orlando Penner, RN, MSN, CDE Diabetes Coordinator Inpatient Diabetes Program [phone] (Team Pager from 8am to 5pm)

## 2020-11-02 NOTE — Progress Notes (Signed)
  Echocardiogram 2D Echocardiogram has been performed.  Leta Jungling M 11/02/2020, 12:58 PM

## 2020-11-02 NOTE — Progress Notes (Addendum)
Patient ID: Mary Mccarthy, female   DOB: August 26, 1940, 80 y.o.   MRN: [id number] Patient admitted early this morning for nausea, vomiting and weakness and was found to have AKI and started on IV fluids.  Patient seen and examined at bedside and plan of care discussed with her.  I reviewed patient's medical records including this morning's H&P, current vitals, labs, medications myself.  Continue IV fluids.  Repeat a.m. labs.  Hold metformin.  Consult cardiology.

## 2020-11-02 NOTE — Plan of Care (Signed)
  Problem: Nutrition: Goal: Adequate nutrition will be maintained Outcome: Progressing   Problem: Coping: Goal: Level of anxiety will decrease Outcome: Progressing   Problem: Activity: Goal: Risk for activity intolerance will decrease Outcome: Progressing   

## 2020-11-02 NOTE — H&P (Signed)
History and Physical    Mary Mccarthy KCM:[id number] DOB: [birth year] DOA: 11/01/2020  PCP: Biagio Borg, MD   Patient coming from:   Home  Chief Complaint: nausea and vomiting, weakness  HPI: Mary Mccarthy is a 80 y.o. female with medical history significant for HTN, DMT2, PAF, CAD, s/p TAVR recently, HLD who was admitted recently for a-fib with RVR and had GI bleed. Underwent TAVR while in hospital and was discharged on eliquis on 10/28/20. Presents tonight with complaint of persistent nausea and vomiting and unable to tolerate any p.o. intake for the last 24 hours.  She has not had any fever or chills.  She denies any chest pain, orthopnea, shortness of breath, abdominal pain or diarrhea.  She denies any urinary frequency or dysuria.  She reports feeling very anxious and jittery.  She was given Ativan in the emergency room which seemed to help her.  She was found on labs to have an acute kidney injury.   ED Course: Resume she has mild elevated blood pressure and mild tachycardia.  Symptoms improved after being given Ativan in the emergency room.  She was found to have a mild AKI with a creatinine 1.52 from baseline of 1.25.  She was not able to tolerate p.o. intake secondary to nausea and vomiting and hospital service was asked to admit for further management. CBC was unremarkable with her baseline anemia.  Sodium was 140 potassium 3.8 chloride 107 bicarb 28 creatinine 1.41 earlier today was 1.52 BUN 24 glucose 73 BNP 124.6 which is decreased from previous levels.  Troponin elevated at 211 although this may be secondary to her recent TAVR.  She has not had no chest pain and no EKG changes.  Lactic acid level is mild elevated 2.6.  Hospitalist service asked admit for further management  Review of Systems:  General: Denies fever, chills, weight loss, night sweats.  Denies dizziness.  Denies change in appetite HENT: Denies headache, denies change in hearing, tinnitus. Denies nasal congestion or  bleeding. Denies sore throat.   Eyes: Denies blurry vision, pain in eye, drainage.  Denies discoloration of eyes. Neck: Denies pain.  Denies swelling.  Denies pain with movement. Cardiovascular: Denies chest pain, palpitations.  Denies edema.  Denies orthopnea Respiratory: Denies shortness of breath, cough.  Denies wheezing.  Denies sputum production Gastrointestinal: Denies abdominal pain, swelling.  Denies diarrhea.  Denies melena.  Denies hematemesis. Musculoskeletal: Denies limitation of movement.  Denies deformity or swelling.  Denies pain.  Denies arthralgias or myalgias. Genitourinary: Denies pelvic pain.  Denies urinary frequency or hesitancy.  Denies dysuria.  Skin: Denies rash.  Denies petechiae, purpura, ecchymosis. Neurological: Denies syncope. Denies seizure activity. Denies paresthesia. Denies slurred speech, drooping face. Denies visual change. Psychiatric: Denies depression, anxiety. Denies hallucinations.  Past Medical History:  Diagnosis Date   ALLERGIC RHINITIS 06/24/2007   Anemia    AVM (arteriovenous malformation) of colon    Blood transfusion without reported diagnosis 05/2015   COLONIC POLYPS, HX OF 06/24/2007   Coronary artery disease, non-occlusive 05/2015   Trop + w/ Acute Anemia =>CATH: small RI - Ostial 60%, ostial RCA 30% and dLAD 40-50%;; 9/'21: Cor Ca Score 624. Mild (25-49%) prox RCA & LAD,; Moderate (50-69%) Ostial Small RI & prox LCx.     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  Angelena Form & Dr. Cyndia Bent   Severe aortic stenosis     Past Surgical History:  Procedure Laterality Date   APPENDECTOMY     BIOPSY  10/21/2020   Procedure: BIOPSY;  Surgeon: Sharyn Creamer, MD;  Location: [facility] ENDOSCOPY;  Service: Gastroenterology;;   BREAST BIOPSY     CARDIAC CATHETERIZATION N/A 05/21/2015   Procedure: Left Heart Cath and Coronary Angiography;  Surgeon: Leonie Man, MD;  Location: [place] CV LAB;  Service: Cardiovascular;: Ost RI 60%, Ost RCA 30%, dLAD tapers to small vessel w/ 40-50%. Mildly elevated LVEDP. Normal LV Fxn.   COLONOSCOPY N/A 05/20/2015   Procedure: COLONOSCOPY;  Surgeon: Manus Gunning, MD;  Location: [place];  Service: Gastroenterology;  Laterality: N/A;   CORONARY CA2+ SCORE / CARDIAC CT ANGIOGRAM  10/09/2019   Calcium score 624.  82nd percentile. Dominant RCA: Mild (25-49%) proximal stenosis-distal bifurcation into PDA and PAV--< RPL branches.  LAD (1 major mid vessel diagonal) diffuse calcified plaque, mild proximal stenosis with minimal distal stenosis.  Small RI moderate ostial disease.  LCx-moderate mixed (50-69%) proximal stenosis.  Small dOM1 disease.  Trileaflet AoV, annular Ca2+ - probable AS   ESOPHAGOGASTRODUODENOSCOPY N/A 05/20/2015   Procedure: ESOPHAGOGASTRODUODENOSCOPY (EGD);  Surgeon: Manus Gunning, MD;  Location: [place];  Service: Gastroenterology;  Laterality: N/A;   ESOPHAGOGASTRODUODENOSCOPY (EGD) WITH PROPOFOL N/A 10/21/2020   Procedure: ESOPHAGOGASTRODUODENOSCOPY (EGD) WITH PROPOFOL;  Surgeon: Sharyn Creamer, MD;  Location: [place];  Service: Gastroenterology;  Laterality: N/A;   INTRAOPERATIVE TRANSTHORACIC ECHOCARDIOGRAM N/A 10/26/2020   Procedure: INTRAOPERATIVE TRANSTHORACIC ECHOCARDIOGRAM;  Surgeon: Burnell Blanks, MD;  Location: [place];  Service: Open Heart Surgery;  Laterality: N/A;   RIGHT/LEFT HEART CATH AND CORONARY ANGIOGRAPHY N/A 09/29/2020   Procedure: RIGHT/LEFT HEART CATH AND CORONARY  ANGIOGRAPHY;  Surgeon: Burnell Blanks, MD;  Location: [place] CV LAB;  Service: Cardiovascular;  Laterality: N/A;   TONSILLECTOMY     TRANSCATHETER AORTIC VALVE REPLACEMENT, TRANSFEMORAL N/A 10/26/2020   Procedure: TRANSCATHETER AORTIC VALVE REPLACEMENT, TRANSFEMORAL;  Surgeon: Burnell Blanks, MD;  Location: [place];  Service: Open Heart Surgery;  Laterality: N/A;   TRANSTHORACIC ECHOCARDIOGRAM  03/2019; 09/2019   a) EF 60 to 65%.  Moderate LVH.  GRII DD.  Mod-Severe AS (m grad 36 mmHg, peak 59 mmHg); b) EF 65-70%, No RWMA. Gr1 DD/hi LAP, Mild hi PAP. Mod LA Dil. MOD AS (mean Grad 34.5 mmHg).  STABLE    TRANSTHORACIC ECHOCARDIOGRAM  04/12/2017   EF 60-65%. No RWMA.  GR 1 DD.  Moderate concentric LVH.  Mild LA dilation. Severe calcified aortic valve with moderate-severe aortic stenosis (peak/mean gradients 60/34 mmHg) and in severe range by AVA (0.8 cm2).  Mild to moderately increased PA pressures 41 mmHg with normal RV size and function..     TUBAL LIGATION      Social History  reports that she has never smoked. She has never used smokeless tobacco. She reports that she does not drink alcohol and does not use drugs.  Allergies  Allergen Reactions   Ibuprofen Other (See Comments)    Bleeding events    Family History  Problem Relation Age of  Onset   Alcohol abuse Other    Arthritis Other        DJD   Hyperlipidemia Other    Heart disease Other    Stroke Other    Hypertension Other    Depression Other    Diabetes Other    Cancer Mother        ENT cancer   CAD Sister        Several MI & PPM; long term smoker, EtOH   Heart attack Sister 36       several   Arrhythmia Sister        s/p PPM (Dr. Sallyanne Kuster)     Prior to Admission medications   Medication Sig Start Date End Date Taking? Authorizing Provider  acetaminophen (TYLENOL) 500 MG tablet Take 500 mg by mouth every 6 (six) hours as needed for moderate pain.   Yes [provider]  ALPRAZolam (XANAX)  0.25 MG tablet Take 1 tablet (0.25 mg total) by mouth 3 (three) times daily as needed for anxiety. 10/06/20  Yes Biagio Borg, MD  amiodarone (PACERONE) 200 MG tablet Take 1 tablet (200 mg total) by mouth daily. 10/28/20  Yes Eileen Stanford, PA-C  apixaban (ELIQUIS) 5 MG TABS tablet Take 1 tablet (5 mg total) by mouth 2 (two) times daily. 08/19/20  Yes Biagio Borg, MD  atorvastatin (LIPITOR) 40 MG tablet TAKE 1 TABLET BY MOUTH EVERY DAY Patient taking differently: Take 40 mg by mouth daily. 08/20/20  Yes Leonie Man, MD  cyanocobalamin ([employer] VITAMIN B12) 1000 MCG tablet Take 1 tablet (1,000 mcg total) by mouth daily. 07/14/20  Yes Biagio Borg, MD  furosemide (LASIX) 40 MG tablet Take 1 tablet (40 mg total) by mouth daily. 10/28/20  Yes Eileen Stanford, PA-C  glimepiride (AMARYL) 2 MG tablet TAKE 1 TABLET BY MOUTH EVERY DAY BEFORE BREAKFAST Patient taking differently: Take 2 mg by mouth daily with breakfast. 09/16/20  Yes Biagio Borg, MD  metFORMIN (GLUCOPHAGE) 1000 MG tablet TAKE 1 TAB BY MOUTH IN THE AM,AND 1/2 TAB IN THE PM Patient taking differently: Take 500-1,000 mg by mouth 2 (two) times daily with a meal. Take $RemoveBe'1000mg'YpXRSHOMK$  in the AM and 500 in the PM 07/14/20  Yes Biagio Borg, MD  ondansetron (ZOFRAN) 4 MG tablet Take 1 tablet (4 mg total) by mouth every 8 (eight) hours as needed for nausea or vomiting. 11/01/20   Eileen Stanford, PA-C  pantoprazole (PROTONIX) 40 MG tablet Take 1 tablet (40 mg total) by mouth 2 (two) times daily. 10/28/20  Yes Eileen Stanford, PA-C  potassium chloride SA (KLOR-CON) 20 MEQ tablet Take 1 tablet (20 mEq total) by mouth daily. 10/28/20  Yes Eileen Stanford, PA-C  amLODipine (NORVASC) 10 MG tablet Take 1 tablet (10 mg total) by mouth daily. Patient not taking: Reported on 11/01/2020 10/28/20   Eileen Stanford, PA-C  blood glucose meter kit and supplies Dispense based on patient and insurance preference. Use up to four times daily as directed. E11.9  04/15/18   Biagio Borg, MD  Blood Glucose Monitoring Suppl (ONE TOUCH ULTRA 2) w/Device KIT Use as directed daily 06/01/15   Biagio Borg, MD  hydrALAZINE (APRESOLINE) 100 MG tablet Take 1 tablet (100 mg total) by mouth every 8 (eight) hours. Patient not taking: No sig reported 10/28/20   Angelena Form R, PA-C  Lancets [facility] DELICA PLUS [id number]) MISC USE AS DIRECTED TEST 1-2 TIMES A DAY 09/05/20  Biagio Borg, MD  [facility] ULTRA test strip USE AS INSTRUCTED TEST 1-2 TIMES A DAY 10/12/20   Biagio Borg, MD    Physical Exam: Vitals:   11/01/20 2215 11/01/20 2300 11/02/20 0014 11/02/20 0040  BP: (!) 172/66 (!) 150/70 (!) 156/63 (!) 156/46  Pulse: 82 80 78 80  Resp: 16 (!) $Remo'22 18 18  'cgZEq$ Temp:   97.8 F (36.6 C) 97.7 F (36.5 C)  TempSrc:   Oral Oral  SpO2: 99% 100% 97% 100%  Weight:    86.3 kg  Height:        Constitutional: NAD, calm, comfortable Vitals:   11/01/20 2215 11/01/20 2300 11/02/20 0014 11/02/20 0040  BP: (!) 172/66 (!) 150/70 (!) 156/63 (!) 156/46  Pulse: 82 80 78 80  Resp: 16 (!) $Remo'22 18 18  'JgfSz$ Temp:   97.8 F (36.6 C) 97.7 F (36.5 C)  TempSrc:   Oral Oral  SpO2: 99% 100% 97% 100%  Weight:    86.3 kg  Height:       General: WDWN, Alert and oriented x3.  Eyes: EOMI, PERRL, conjunctivae normal.  Sclera nonicteric HENT:  [place]/AT, external ears normal.  Nares patent without epistasis.  Mucous membranes are dry Neck: Soft, normal range of motion, supple, no masses,Trachea midline Respiratory: clear to auscultation bilaterally, no wheezing, no crackles. Normal respiratory effort. No accessory muscle use.  Cardiovascular: Regular rate and rhythm, no murmurs / rubs / gallops. No extremity edema. 2+ pedal pulses. No carotid bruits.  Abdomen: Soft, no tenderness, nondistended, no rebound or guarding.  No masses palpated. Bowel sounds normoactive Musculoskeletal: FROM. no cyanosis. No joint deformity upper and lower extremities. Normal muscle tone.  Skin: Warm, dry, intact  no rashes, lesions, ulcers. No induration.  Decreased skin turgor Neurologic: CN 2-12 grossly intact.  Normal speech.  Sensation intact. Strength 5/5 in all extremities.   Psychiatric: Normal judgment and insight.  Normal mood.    Labs on Admission: I have personally reviewed following labs and imaging studies  CBC: Recent Labs  Lab 10/26/20 0342 10/26/20 0750 10/27/20 0454 10/28/20 0132 11/01/20 1203 11/01/20 2023  WBC 5.9  --  6.7 6.7 5.5 5.5  NEUTROABS  --   --   --   --  3.3 2.8  HGB 9.3* 8.8* 8.8* 8.3* 9.4* 8.9*  HCT 28.6* 26.0* 26.1* 25.6* 28.9* 27.0*  MCV 96.0  --  94.9 95.9 93 93.4  PLT 213  --  124* 124* 253 902    Basic Metabolic Panel: Recent Labs  Lab 10/26/20 0342 10/26/20 0750 10/27/20 0454 10/28/20 0132 11/01/20 1203 11/01/20 2023  NA 136 138 135 133* 136 140  K 4.5 4.2 4.3 4.4 4.2 3.8  CL 99 100 101 102 98 107  CO2 28  --  24 26 18* 20*  GLUCOSE 164* 167* 124* 167* 148* 73  BUN $Re'23 20 20 19 23 'lqN$ 24*  CREATININE 1.27* 1.00 1.07* 1.25* 1.52* 1.41*  CALCIUM 10.1  --  9.2 9.3 10.2 10.6*  MG  --   --  1.7  --   --   --     GFR: Estimated Creatinine Clearance: 30.7 mL/min (A) (by C-G formula based on SCr of 1.41 mg/dL (H)).  Liver Function Tests: Recent Labs  Lab 11/01/20 1203 11/01/20 2023  AST 27 32  ALT 15 21  ALKPHOS 61 49  BILITOT 0.7 1.0  PROT 6.7 7.1  ALBUMIN 4.0 3.6    Urine analysis:    Component Value  Date/Time   COLORURINE YELLOW 10/26/2020 0242   APPEARANCEUR CLEAR 10/26/2020 0242   LABSPEC 1.010 10/26/2020 0242   PHURINE 6.5 10/26/2020 0242   GLUCOSEU NEGATIVE 10/26/2020 0242   GLUCOSEU NEGATIVE 03/18/2020 1125   HGBUR NEGATIVE 10/26/2020 [place] 10/26/2020 0242   KETONESUR NEGATIVE 10/26/2020 0242   PROTEINUR NEGATIVE 10/26/2020 0242   UROBILINOGEN 1.0 03/18/2020 1125   NITRITE NEGATIVE 10/26/2020 0242   LEUKOCYTESUR NEGATIVE 10/26/2020 0242    Radiological Exams on Admission: DG Chest Port 1  View  Result Date: 11/01/2020 CLINICAL DATA:  Weakness nausea vomiting EXAM: PORTABLE CHEST 1 VIEW COMPARISON:  10/26/2020 FINDINGS: Electronic device over the left chest. Redemonstrated TAVR. No focal opacity, pleural effusion or pneumothorax. Mild cardiomegaly. IMPRESSION: No active disease.  Mild cardiomegaly Electronically Signed   By: Donavan Foil M.D.   On: 11/01/2020 19:46    EKG: Independently reviewed.  EKG reveals normal sinus rhythm with no acute ST elevation or depression.  QTc 466  Assessment/Plan Principal Problem:   AKI (acute kidney injury) Mary Mccarthy is admitted to [place] floor. Has AKI secondary to nausea and vomiting with decreased po intake. IV fluid hydration with LR at 75 ml/hr Recheck electrolytes and renal function in am  Active Problems:   Essential hypertension Continue home medications.  Monitor blood pressure    Dehydration IV fluid hydration. Lactic acid elevated which can be due to dehydration. Will recheck lactic acid level after IVF hydration    Diabetes mellitus type 2 in obese Continue metformin.  Hold amaryl to prevent hypoglycemia.  Blood sugar be rechecked in morning with labs    Chronic diastolic heart failure Stable. No sign of volume overload.    Paroxysmal atrial fibrillation  Continue amiodarone and Eliquis    Anxiety Ativan as needed for anxiety.    S/P TAVR (transcatheter aortic valve replacement)   DVT prophylaxis: Is on Eliquis for anticoagulation which is continued  Code Status:   Full code Family Communication:  Diagnosis and plan discussed with patient.  She verbalized understanding agrees with plan.  Further recommendation follow as clinical indicated Disposition Plan:   Patient is from:  Home  Anticipated DC to:  Home  Anticipated DC date:  Anticipate 2 midnight or more stay in the hospital  Admission status:  Inpatient   Yevonne Aline Zaelyn Barbary MD [facility]  How to contact the [facility] Attending or Consulting  provider [place] or covering provider during after hours [place], for this patient?   Check the care team in [facility] and look for a) attending/consulting [facility] provider listed and b) the [facility] team listed Log into [email or web address] and use [place] universal password to access. If you do not have the password, please contact the hospital operator. Locate the [facility] provider you are looking for under [facility] and page to a number that you can be directly reached. If you still have difficulty reaching the provider, please page the [facility] (Director on Call) for the Hospitalists listed on amion for assistance.  11/02/2020, 12:57 AM

## 2020-11-02 NOTE — Progress Notes (Signed)
PT Cancellation Note  Patient Details Name: Brooklynn Brandenburg MRN: [id number] DOB: 1940-06-03   Cancelled Treatment:    Reason Eval/Treat Not Completed: Patient at procedure or test/unavailable Nurse tech in room providing care.  HR per telemetry in afib 100-135 bpm.  Will check back as schedule permits.   Janan Halter [place] 11/02/2020, 3:39 PM Thomasene Mohair PT, DPT Acute Rehabilitation Services Pager: [phone] Office: [phone]

## 2020-11-03 DIAGNOSIS — J9601 Acute respiratory failure with hypoxia: Secondary | ICD-10-CM | POA: Diagnosis not present

## 2020-11-03 DIAGNOSIS — N179 Acute kidney failure, unspecified: Secondary | ICD-10-CM | POA: Diagnosis not present

## 2020-11-03 DIAGNOSIS — Z952 Presence of prosthetic heart valve: Secondary | ICD-10-CM | POA: Diagnosis not present

## 2020-11-03 DIAGNOSIS — I480 Paroxysmal atrial fibrillation: Secondary | ICD-10-CM | POA: Diagnosis not present

## 2020-11-03 LAB — CBC WITH DIFFERENTIAL/PLATELET
Abs Immature Granulocytes: 0.02 10*3/uL (ref 0.00–0.07)
Basophils Absolute: 0 10*3/uL (ref 0.0–0.1)
Basophils Relative: 0 %
Eosinophils Absolute: 0.2 10*3/uL (ref 0.0–0.5)
Eosinophils Relative: 4 %
HCT: 26 % — ABNORMAL LOW (ref 36.0–46.0)
Hemoglobin: 8.1 g/dL — ABNORMAL LOW (ref 12.0–15.0)
Immature Granulocytes: 0 %
Lymphocytes Relative: 21 %
Lymphs Abs: 1.4 10*3/uL (ref 0.7–4.0)
MCH: 30.2 pg (ref 26.0–34.0)
MCHC: 31.2 g/dL (ref 30.0–36.0)
MCV: 97 fL (ref 80.0–100.0)
Monocytes Absolute: 0.6 10*3/uL (ref 0.1–1.0)
Monocytes Relative: 9 %
Neutro Abs: 4.4 10*3/uL (ref 1.7–7.7)
Neutrophils Relative %: 66 %
Platelets: 219 10*3/uL (ref 150–400)
RBC: 2.68 MIL/uL — ABNORMAL LOW (ref 3.87–5.11)
RDW: 15.1 % (ref 11.5–15.5)
WBC: 6.7 10*3/uL (ref 4.0–10.5)
nRBC: 0 % (ref 0.0–0.2)

## 2020-11-03 LAB — MAGNESIUM
Magnesium: 1.4 mg/dL — ABNORMAL LOW (ref 1.6–2.3)
Magnesium: 2.1 mg/dL (ref 1.7–2.4)

## 2020-11-03 LAB — BASIC METABOLIC PANEL
Anion gap: 5 (ref 5–15)
BUN: 18 mg/dL (ref 8–23)
CO2: 25 mmol/L (ref 22–32)
Calcium: 10 mg/dL (ref 8.9–10.3)
Chloride: 109 mmol/L (ref 98–111)
Creatinine, Ser: 1.01 mg/dL — ABNORMAL HIGH (ref 0.44–1.00)
GFR, Estimated: 56 mL/min — ABNORMAL LOW (ref >60)
Glucose, Bld: 93 mg/dL (ref 70–99)
Potassium: 4.6 mmol/L (ref 3.5–5.1)
Sodium: 139 mmol/L (ref 135–145)

## 2020-11-03 LAB — SPECIMEN STATUS REPORT

## 2020-11-03 MED ORDER — FUROSEMIDE 40 MG PO TABS
40.0000 mg | ORAL_TABLET | Freq: Every day | ORAL | Status: DC
  Administered 2020-11-04: 40 mg via ORAL
  Filled 2020-11-03: qty 1

## 2020-11-03 MED ORDER — LOSARTAN POTASSIUM 50 MG PO TABS
25.0000 mg | ORAL_TABLET | Freq: Every day | ORAL | Status: DC
  Administered 2020-11-03: 25 mg via ORAL
  Filled 2020-11-03: qty 1

## 2020-11-03 NOTE — Evaluation (Signed)
Physical Therapy Evaluation Patient Details Name: Mary Mccarthy MRN: [id number] DOB: [birth year] Today's Date: 11/03/2020  History of Present Illness  Pt is an 80 y.o. female who presented 9/26 with N/V, elevated troponin. recently admitted to [facility]  09/27/20 with SOB and chest tightness, acute on chronic diastolic CHF, complicated by severe AS. S/P cardiac cath 8/24, S/P TAVR. PMH includes CVA (08/2020), CAD, HTN, HLD, afib on Eliquis, obesity, DM2, anemia, MI, aortic stenosis, osteoporosis.  Clinical Impression  Patient  tolerated ambulation x 200' on RA, SPO2 965. Patient plans to return home, has family support. Declining HHPT at this time. Patient appears near her baseline for mod.Independence in her home. Patient  did not drive but did own grocery shopping and   household chores.  Pt admitted with above diagnosis.  Pt currently with functional limitations due to the deficits listed below (see PT Problem List). Pt will benefit from skilled PT to increase their independence and safety with mobility to allow discharge to the venue listed below.        Recommendations for follow up therapy are one component of a multi-disciplinary discharge planning process, led by the attending physician.  Recommendations may be updated based on patient status, additional functional criteria and insurance authorization.  Follow Up Recommendations No PT follow up (pt declines)    Equipment Recommendations  None recommended by PT    Recommendations for Other Services       Precautions / Restrictions Precautions Precautions: Fall Precaution Comments: monitor VS      Mobility  Bed Mobility               General bed mobility comments: in recliner    Transfers Overall transfer level: Modified independent Equipment used: None                Ambulation/Gait Ambulation/Gait assistance: Supervision Gait Distance (Feet): 200 Feet Assistive device: 4-wheeled walker Gait Pattern/deviations:  Step-through pattern     General Gait Details: patient steady with RW  Stairs            Wheelchair Mobility    Modified Rankin (Stroke Patients Only)       Balance Overall balance assessment: No apparent balance deficits (not formally assessed)                                           Pertinent Vitals/Pain Pain Assessment: No/denies pain    Home Living Family/patient expects to be discharged to:: Private residence Living Arrangements: Children Available Help at Discharge: Family;Available 24 hours/day Type of Home: House Home Access: Stairs to enter   [employer] of Steps: 1 Home Layout: One level Home Equipment: Walker - 2 wheels;Cane - single point;Walker - 4 wheels;Cane - quad;Bedside commode;Shower seat - built in      Prior Function Level of Independence: Independent with assistive device(s)               Hand Dominance   Dominant Hand: Right    Extremity/Trunk Assessment                Communication   Communication: No difficulties  Cognition Arousal/Alertness: Awake/alert Behavior During Therapy: WFL for tasks assessed/performed Overall Cognitive Status: Within Functional Limits for tasks assessed  General Comments      Exercises     Assessment/Plan    PT Assessment Patient needs continued PT services  PT Problem List Decreased strength;Decreased activity tolerance;Decreased safety awareness;Cardiopulmonary status limiting activity       PT Treatment Interventions DME instruction;Therapeutic activities;Gait training;Therapeutic exercise;Patient/family education;Functional mobility training    PT Goals (Current goals can be found in the Care Plan section)  Acute Rehab PT Goals Patient Stated Goal: to go home PT Goal Formulation: With patient Time For Goal Achievement: 11/17/20 Potential to Achieve Goals: Good    Frequency Min 2X/week    Barriers to discharge        Co-evaluation               AM-PAC PT "6 Clicks" Mobility  Outcome Measure Help needed turning from your back to your side while in a flat bed without using bedrails?: A Little Help needed moving from lying on your back to sitting on the side of a flat bed without using bedrails?: A Little Help needed moving to and from a bed to a chair (including a wheelchair)?: A Little Help needed standing up from a chair using your arms (e.g., wheelchair or bedside chair)?: A Little Help needed to walk in hospital room?: A Little Help needed climbing 3-5 steps with a railing? : A Little 6 Click Score: 18    End of Session   Activity Tolerance: Patient tolerated treatment well Patient left: in chair;with call bell/phone within reach Nurse Communication: Mobility status PT Visit Diagnosis: Difficulty in walking, not elsewhere classified (R26.2)    Time: 2620-3559 PT Time Calculation (min) (ACUTE ONLY): 20 min   Charges:   PT Evaluation $PT Eval Low Complexity: 1 Low          [name] PT Acute Rehabilitation Services Pager [phone] Office [phone]   [name] 11/03/2020, 12:43 PM

## 2020-11-03 NOTE — Assessment & Plan Note (Signed)
-   not on home O2 - walk test performed and patient able to be weaned off O2 to RA on 9/28

## 2020-11-03 NOTE — Hospital Course (Addendum)
Mary Mccarthy is an 80 y.o. female with medical history significant for HTN, DMT2, PAF, CAD, s/p TAVR recently, HLD who was admitted recently for a-fib with RVR and had GI bleed. Underwent TAVR while in hospital and was discharged on eliquis on 10/28/20.   Presented with complaint of persistent nausea and vomiting and unable to tolerate any p.o. intake for the last 24 hours.  She has not had any fever or chills.  She denies any chest pain, orthopnea, shortness of breath, abdominal pain or diarrhea.  She denies any urinary frequency or dysuria.  She reports feeling very anxious and jittery.  She was given Ativan in the emergency room which seemed to help her.  She was found on labs to have an acute kidney injury.   She responded well to IVF with improvement in her renal function. Cardiology was consulted due to history of bradycardia with beta-blockers and recent TAVR.  See below for further A&P.

## 2020-11-03 NOTE — Assessment & Plan Note (Addendum)
-   possibly has CKDII but renal function needs to further stabilize; has fluctuated too much recently  - EF has improved on recent echo, concerned for some possible overdiuresis with home Lasix dose as renal function has corrected with fluids - will discuss with cardiology prior to d/c - baseline creatinine ~ 1.1 - patient presents with increase in creat >0.3 mg/dL above baseline, creat increase >1.5x baseline presumed to have occurred within past 7 days PTA -Creatinine 1.52 on admission - creat returned to 1.01 at discharge; GFR 56

## 2020-11-03 NOTE — Progress Notes (Addendum)
Progress Note  Patient Name: Mary Mccarthy Date of Encounter: 11/03/2020  [facility] Cardiologist: Bryan Lemma, MD   Subjective   Patient is sitting in chair, states she feels much improved today, no longer having any nausea, vomiting, diarrhea since yesterday. She was able to eat her meals. She denied any chest pain, worsening of chronic SOB, dizziness.   Inpatient Medications    Scheduled Meds:  amiodarone  200 mg Oral Daily   amLODipine  10 mg Oral Daily   apixaban  5 mg Oral BID   atorvastatin  40 mg Oral Daily   [START ON 11/04/2020] furosemide  40 mg Oral Daily   losartan  25 mg Oral QHS   magnesium oxide  400 mg Oral BID   metoprolol succinate  12.5 mg Oral Daily   pantoprazole  40 mg Oral BID   Continuous Infusions:   PRN Meds: acetaminophen **OR** acetaminophen, LORazepam, ondansetron **OR** ondansetron (ZOFRAN) IV   Vital Signs    Vitals:   11/02/20 1300 11/02/20 2007 11/03/20 0105 11/03/20 0341  BP: (!) 140/57 (!) 141/47  (!) 154/56  Pulse: 77 77  70  Resp: 16 20  18   Temp: 97.8 F (36.6 C) 98.5 F (36.9 C)  98.8 F (37.1 C)  TempSrc: Oral Oral  Oral  SpO2: 99% 100%  99%  Weight:  87.2 kg 87.2 kg   Height:        Intake/Output Summary (Last 24 hours) at 11/03/2020 1012 Last data filed at 11/03/2020 0902 Gross per 24 hour  Intake 1910.47 ml  Output 1300 ml  Net 610.47 ml   Last 3 Weights 11/03/2020 11/02/2020 11/02/2020  Weight (lbs) 192 lb 3.9 oz 192 lb 3.9 oz 190 lb 4.1 oz  Weight (kg) 87.2 kg 87.2 kg 86.3 kg      Telemetry    Sinus rhythm 80s this AM, A fib RVR noted from 1300-1840 11/02/20, sinus pause lasting 3.07 secs when converted to SR,  NSVT up to 10 runs noted over the past 24 hours - Personally Reviewed  ECG    N/A this AM - Personally Reviewed  Physical Exam   GEN: No acute distress. Sitting in chair, improved energy.  Neck: No JVD Cardiac: RRR, systolic murmur grade II throughout  Respiratory: Clear to auscultation  bilaterally. On 2LNC. Speaks full sentence.  GI: Soft, nontender, non-distended  MS: No BLE edema; No deformity. Neuro:  Nonfocal  Psych: Normal affect   Labs    High Sensitivity Troponin:   Recent Labs  Lab 10/19/20 1326 11/01/20 2023 11/02/20 0058 11/02/20 0533 11/02/20 0734  TROPONINIHS 45* 211* 191* 171* 177*     Chemistry Recent Labs  Lab 11/01/20 1203 11/01/20 2023 11/02/20 0533 11/02/20 0734 11/03/20 0533  NA 136 140 137  --  139  K 4.2 3.8 3.7  --  4.6  CL 98 107 105  --  109  CO2 18* 20* 26  --  25  GLUCOSE 148* 73 57*  --  93  BUN 23 24* 21  --  18  CREATININE 1.52* 1.41* 1.10*  --  1.01*  CALCIUM 10.2 10.6* 9.4  --  10.0  MG 1.4*  --   --  1.5* 2.1  PROT 6.7 7.1  --   --   --   ALBUMIN 4.0 3.6  --   --   --   AST 27 32  --   --   --   ALT 15 21  --   --   --  ALKPHOS 61 49  --   --   --   BILITOT 0.7 1.0  --   --   --   GFRNONAA  --  37* 50*  --  56*  ANIONGAP  --  13 6  --  5    Lipids No results for input(s): CHOL, TRIG, HDL, LABVLDL, LDLCALC, CHOLHDL in the last 168 hours.  Hematology Recent Labs  Lab 11/01/20 2023 11/02/20 0533 11/03/20 0533  WBC 5.5 4.7 6.7  RBC 2.89* 2.58* 2.68*  HGB 8.9* 8.0* 8.1*  HCT 27.0* 24.6* 26.0*  MCV 93.4 95.3 97.0  MCH 30.8 31.0 30.2  MCHC 33.0 32.5 31.2  RDW 14.8 15.1 15.1  PLT 234 201 219   Thyroid No results for input(s): TSH, FREET4 in the last 168 hours.  BNP Recent Labs  Lab 11/01/20 1203 11/01/20 2023  BNP  --  124.6*  PROBNP 993*  --     DDimer No results for input(s): DDIMER in the last 168 hours.   Radiology    DG Chest Port 1 View  Result Date: 11/01/2020 CLINICAL DATA:  Weakness nausea vomiting EXAM: PORTABLE CHEST 1 VIEW COMPARISON:  10/26/2020 FINDINGS: Electronic device over the left chest. Redemonstrated TAVR. No focal opacity, pleural effusion or pneumothorax. Mild cardiomegaly. IMPRESSION: No active disease.  Mild cardiomegaly Electronically Signed   By: Jasmine Pang M.D.   On:  11/01/2020 19:46   ECHOCARDIOGRAM COMPLETE  Result Date: 11/02/2020    ECHOCARDIOGRAM REPORT   Patient Name:   Mary Mccarthy Date of Exam: 11/02/2020 Medical Rec #:  [id number]      Height:       61.0 in Accession #:    [id number]     Weight:       190.3 lb Date of Birth:  October 31, 1940      BSA:          1.849 m Patient Age:    80 years       BP:           140/57 mmHg Patient Gender: F              HR:           76 bpm. Exam Location:  Inpatient Procedure: 2D Echo, Intracardiac Opacification Agent, Cardiac Doppler and Color            Doppler Indications:    Elevated Troponin  History:        Patient has prior history of Echocardiogram examinations, most                 recent 10/27/2020. CAD, Pulmonary HTN, Arrythmias:Atrial                 Fibrillation; Risk Factors:Hypertension and Diabetes.                 Aortic Valve: 26 mm Sapien prosthetic, stented (TAVR) valve is                 present in the aortic position. Procedure Date: 10/26/2020.  Sonographer:    Leta Jungling RDCS Referring Phys: [id number] Cyndi Bender IMPRESSIONS  1. Left ventricular ejection fraction, by estimation, is 60 to 65%. The left ventricle has normal function. The left ventricle has no regional wall motion abnormalities. There is mild left ventricular hypertrophy. Left ventricular diastolic parameters are indeterminate.  2. Right ventricular systolic function is normal. The right ventricular size is normal.  3. Left atrial size was moderately dilated.  4. The mitral valve is normal in structure. No evidence of mitral valve regurgitation. No evidence of mitral stenosis.  5. The aortic valve has been repaired/replaced. Aortic valve regurgitation is not visualized. No aortic stenosis is present. There is a 26 mm Sapien prosthetic (TAVR) valve present in the aortic position. Procedure Date: 10/26/2020. Echo findings are consistent with normal structure and function of the aortic valve prosthesis. Aortic valve area, by VTI measures 1.37 cm.  Aortic valve mean gradient measures 9.0 mmHg. Aortic valve Vmax measures 2.16 m/s.  6. The inferior vena cava is normal in size with greater than 50% respiratory variability, suggesting right atrial pressure of 3 mmHg. FINDINGS  Left Ventricle: Left ventricular ejection fraction, by estimation, is 60 to 65%. The left ventricle has normal function. The left ventricle has no regional wall motion abnormalities. Definity contrast agent was given IV to delineate the left ventricular  endocardial borders. The left ventricular internal cavity size was normal in size. There is mild left ventricular hypertrophy. Left ventricular diastolic parameters are indeterminate. Right Ventricle: The right ventricular size is normal. No increase in right ventricular wall thickness. Right ventricular systolic function is normal. Left Atrium: Left atrial size was moderately dilated. Right Atrium: Right atrial size was normal in size. Pericardium: There is no evidence of pericardial effusion. Mitral Valve: The mitral valve is normal in structure. Mild to moderate mitral annular calcification. No evidence of mitral valve regurgitation. No evidence of mitral valve stenosis. Tricuspid Valve: The tricuspid valve is normal in structure. Tricuspid valve regurgitation is not demonstrated. No evidence of tricuspid stenosis. Aortic Valve: The aortic valve has been repaired/replaced. Aortic valve regurgitation is not visualized. No aortic stenosis is present. Aortic valve mean gradient measures 9.0 mmHg. Aortic valve peak gradient measures 18.7 mmHg. Aortic valve area, by VTI  measures 1.37 cm. There is a 26 mm Sapien prosthetic, stented (TAVR) valve present in the aortic position. Procedure Date: 10/26/2020. Echo findings are consistent with normal structure and function of the aortic valve prosthesis. Pulmonic Valve: The pulmonic valve was normal in structure. Pulmonic valve regurgitation is not visualized. No evidence of pulmonic stenosis.  Aorta: The aortic root is normal in size and structure. Venous: The inferior vena cava is normal in size with greater than 50% respiratory variability, suggesting right atrial pressure of 3 mmHg. IAS/Shunts: No atrial level shunt detected by color flow Doppler.  LEFT VENTRICLE PLAX 2D LVIDd:         4.30 cm  Diastology LVIDs:         2.30 cm  LV e' medial:    6.12 cm/s LV PW:         1.30 cm  LV E/e' medial:  21.6 LV IVS:        1.30 cm  LV e' lateral:   5.98 cm/s LVOT diam:     1.75 cm  LV E/e' lateral: 22.1 LV SV:         57 LV SV Index:   31 LVOT Area:     2.41 cm  LEFT ATRIUM         Index LA diam:    5.10 cm 2.76 cm/m  AORTIC VALVE AV Area (Vmax):    1.26 cm AV Area (Vmean):   1.31 cm AV Area (VTI):     1.37 cm AV Vmax:           216.50 cm/s AV Vmean:          137.000 cm/s AV VTI:  0.415 m AV Peak Grad:      18.7 mmHg AV Mean Grad:      9.0 mmHg LVOT Vmax:         113.00 cm/s LVOT Vmean:        74.400 cm/s LVOT VTI:          0.236 m LVOT/AV VTI ratio: 0.57 MITRAL VALVE MV Area (PHT): 2.95 cm     SHUNTS MV Decel Time: 257 msec     Systemic VTI:  0.24 m MV E velocity: 132.00 cm/s  Systemic Diam: 1.75 cm MV A velocity: 114.00 cm/s MV E/A ratio:  1.16 Donato Schultz MD Electronically signed by Donato Schultz MD Signature Date/Time: 11/02/2020/2:03:50 PM    Final     Cardiac Studies   Echo from 11/02/20:   1. Left ventricular ejection fraction, by estimation, is 60 to 65%. The  left ventricle has normal function. The left ventricle has no regional  wall motion abnormalities. There is mild left ventricular hypertrophy.  Left ventricular diastolic parameters are indeterminate.   2. Right ventricular systolic function is normal. The right ventricular  size is normal.   3. Left atrial size was moderately dilated.   4. The mitral valve is normal in structure. No evidence of mitral valve  regurgitation. No evidence of mitral stenosis.   5. The aortic valve has been repaired/replaced. Aortic valve   regurgitation is not visualized. No aortic stenosis is present. There is a  26 mm Sapien prosthetic (TAVR) valve present in the aortic position.  Procedure Date: 10/26/2020. Echo findings are  consistent with normal structure and function of the aortic valve  prosthesis. Aortic valve area, by VTI measures 1.37 cm. Aortic valve mean gradient measures 9.0 mmHg. Aortic valve Vmax measures 2.16 m/s.   6. The inferior vena cava is normal in size with greater than 50%  respiratory variability, suggesting right atrial pressure of 3 mmHg.  Echo from 10/27/20:    1. Left ventricular ejection fraction, by estimation, is 60 to 65%. The  left ventricle has normal function. The left ventricle has no regional  wall motion abnormalities. There is mild left ventricular hypertrophy.  Left ventricular diastolic parameters  are indeterminate.   2. Right ventricular systolic function is normal. The right ventricular  size is normal.   3. Left atrial size was severely dilated.   4. Right atrial size was mildly dilated.   5. The mitral valve is degenerative. Mild mitral valve regurgitation.  Severe mitral annular calcification.   6. Post TAVR with 26 mm supra annular Medtronic Evolut Pro valve trivial  PvL mean gradient 12 peak 24 mmHg AVA 1.8 cm2 with DVI 0.63. The aortic  valve has been repaired/replaced. Aortic valve regurgitation is trivial.  Procedure Date: 10/26/2020.    Right and left heart cath on 09/29/20:     Prox RCA lesion is 20% stenosed.   Mid RCA to Dist RCA lesion is 20% stenosed.   Ramus lesion is 30% stenosed.   Mid LAD lesion is 20% stenosed.   Dist LM to Prox LAD lesion is 20% stenosed.   Mild non-obstructive CAD Severe aortic stenosis (mean gradient 52.8 mmHg, peak to peak gradient 58 mmHg, AVA 0.90 cm2).    Recommendations: Will continue workup for TAVR  Patient Profile     Mary Mccarthy is a 80 y.o. female with a hx of  obesity, type 2 DM, non-obstructive CAD,  hx of SVT,  paroxysmal A fib on amiodarone and Eliquis (initially diagnosed  at the time of acute CVA in 08/2020), HTN, pulmonary HTN, chronic diastolic CHF, and severe aortic stenosis s/p TAVR on 10/26/20, PUD/AVM on 2017 GI scope, currently admitted for AKI due to N/V/D and poor PO tolerance, who is being seen 11/02/2020 for the evaluation of elevated trop and  at the request of Dr. Hanley Ben.   Assessment & Plan    Pre-renal AKI on CKD II/III due to N/V/D, resolved  Non anion gap metabolic acidosis, resolved  Lactic acidosis, resolved  - presented with 3 days worsening N/V/D, symptoms resolved for 24 hours now   - Cr 1.52 POA, baseline 1-1.2, now 1.01 after IVF hydration  - may resume lasix tomorrow and stop IVF today, defer discharge to IM    Elevated troponin Non-obstructive CAD  - she denied any chest pain, angina symptoms - cardiac cath 09/29/20 with Non-obstructive CAD  - Hs trop 211 >191 >171 >177 - EKG at admission with SR,  new onset TWI of inferolateral leads and mild STE on V1-2 concerning repo abnormalities ; repeat EKG 11/02/20 improved TWI  - CXR no acute findings POA  - suspect demand ischemia  - continue medical therapy with Lipitor 40mg , not on ASA due t recent GIB and eliquis use, started on low dose metoprolol XL/tolerating    Hx of severe aortic stenosis s/p TAVR 10/26/20 - no significant worsening of chronic DOE, orthopnea, weight is stable, not hypoxic but on [facility]  - clinically euvolemic today  - Post operative Echo on 10/27/20 showed EF 60%, normally functioning TAVR with a mean gradient of 12 mmHg and trivial PVL. - repeat Echo 11/02/20 with EF 60-65%, no RMWA, no AI or AS, 26 mm Sapien prosthetic (TAVR) valve present in the aortic position, normal structure and function of the aortic valve prosthesis. Aortic valve area, by VTI measures 1.37 cm. Aortic valve mean gradient measures 9.0 mmHg. Aortic valve Vmax measures 2.16 m/s. - follow up with structural heart team as scheduled on 11/11/20     NSVT Paroxysmal A fib with RVR  Hx of transient peri-TAVR complete heart block  - Zio monitor at admission intermittent A fib RVR with ventricular rate up to 130s and NSVT lasting up to 9 runs  - repleted electrolytes, at goal today  - started low metoprolol XL 12.5mg , tolerating (was on Coreg and it was stopped last hospitalization) - continue amiodarone 200mg  daily  - continue Eliquis  - telemetry showed SR 80s today, sinus pause >3 seconds x1 noted when conversion from A fib RVR yesterday, no high grade AVB noted so far, may continue metoprolol XL 12.5mg  at discharge, continue Zio monitor and follow up at structural heart clinic    Hypomagnesemia - resolved with replacement    HTN  - BP is elevated 140/57-154/56 - resumed amlodipine 10mg , started metoprolol XL 12.5mg  daily, may resume lasix, will switch hydralazine to losartan 25mg     Chronic diastolic heart failure  - s/p IVF, if adequate PO, stop IVF today  - GDMT: started metoprolol XL 12.5mg  daily, will switch hydralazine to losartan 25mg     Hx of blood loss anemia with recent GI bleed 10/2020 Hx of AVM and PUD from GI scope on 2017 - Hgb remains low 8-9, no overt GI bleed, FOBT negative this admission  - she was cleared by GI 10/21/20 for anticoagulation with Eliquis, will continue    Type 2 DM Anxiety Gastritis  Hx of ischemic CVA  - managed per IM     For questions or  updates, please contact [facility] Please consult [email or web address] for contact info under        Signed, Cyndi Bender, NP  11/03/2020, 10:12 AM    Patient seen and examined with Cyndi Bender NP.  Agree as above, with the following exceptions and changes as noted below.  She appears well today.  Did have some conversion pauses coming out of atrial fibrillation.  Can discharge home on low-dose metoprolol but this may need to be stopped if conversion pauses or AV block is noted on the remainder of her ZIO monitor.  We discussed this in detail.. Gen: NAD,  CV: RRR, no murmurs, Lungs: clear, Abd: soft, Extrem: Warm, well perfused, no edema, Neuro/Psych: alert and oriented x 3, normal mood and affect. All available labs, radiology testing, previous records reviewed.  She feels much better and is ready to go home when ready by primary service.  Parke Poisson, MD 11/03/20 4:58 PM

## 2020-11-03 NOTE — Assessment & Plan Note (Signed)
TAVR with a24mm Medtronic Evolut Pro +THV via the TF approach on9/20/22. Post operative Echo on 10/27/20 showedEF60%, normally functioning TAVR with a mean gradient of19mmHg and trivialPVL

## 2020-11-03 NOTE — Assessment & Plan Note (Signed)
-   Continue as needed Ativan

## 2020-11-03 NOTE — Progress Notes (Signed)
SATURATION QUALIFICATIONS: (This note is used to comply with regulatory documentation for home oxygen)  Patient Saturations on Room Air at Rest = 94%  Patient Saturations on Room Air while Ambulating = 96%  Patient Saturations on  Liters of oxygen while Ambulating = %  Please briefly explain why patient needs home oxygen:Patient does not require supplemental oxygen at this time.   [name] PT Acute Rehabilitation Services Pager [phone] Office [phone]

## 2020-11-03 NOTE — Assessment & Plan Note (Signed)
-   No signs of decompensation or volume overload - Continue current regimen

## 2020-11-03 NOTE — Progress Notes (Signed)
Progress Note    Mary Mccarthy   QQV:[id number]  DOB: 1940-05-28  DOA: 11/01/2020     2 Date of Service: 11/03/2020   Mary Mccarthy is an 80 y.o. female with medical history significant for HTN, DMT2, PAF, CAD, s/p TAVR recently, HLD who was admitted recently for a-fib with RVR and had GI bleed. Underwent TAVR while in hospital and was discharged on eliquis on 10/28/20.   Presented with complaint of persistent nausea and vomiting and unable to tolerate any p.o. intake for the last 24 hours.  She has not had any fever or chills.  She denies any chest pain, orthopnea, shortness of breath, abdominal pain or diarrhea.  She denies any urinary frequency or dysuria.  She reports feeling very anxious and jittery.  She was given Ativan in the emergency room which seemed to help her.  She was found on labs to have an acute kidney injury.   She responded well to IVF with improvement in her renal function. Cardiology was consulted due to history of bradycardia with beta-blockers and recent TAVR.  Subjective:  No events overnight.  Denies any chest pain or shortness of breath.  Has been voiding well and tolerating fluids well since yesterday.  Denies any dizziness or lightheadedness.  Hospital Problems * AKI (acute kidney injury) (HCC) - possibly has CKDII but renal function needs to further stabilize; has fluctuated too much recently  - EF has improved on recent echo, concerned for some possible overdiuresis with home Lasix dose as renal function has corrected with fluids - will discuss with cardiology prior to d/c - baseline creatinine ~ 1.1 - patient presents with increase in creat >0.3 mg/dL above baseline, creat increase >1.5x baseline presumed to have occurred within past 7 days PTA -Creatinine 1.52 on admission   Paroxysmal atrial fibrillation (HCC) - Continue amiodarone and Eliquis -Toprol started per cardiology, monitor heart rate response  Essential hypertension - BP trending up  some - continue current regimen and monitor further overnight; Toprol started per cardiology   Chronic diastolic heart failure (HCC) - No signs of decompensation or volume overload - Continue current regimen  Acute respiratory failure with hypoxia (HCC)-resolved as of 11/03/2020 - not on home O2 - walk test performed and patient able to be weaned off O2 to RA on 9/28  Diabetes mellitus type 2 in obese (HCC) - Last A1c 5.8% on 10/26/2020 -On metformin and Amaryl at home - Likely can be controlled on discharge with metformin and diet control  S/P TAVR (transcatheter aortic valve replacement) TAVR with a 26 mm Medtronic Evolut Pro + THV via the TF approach on 10/26/20. Post operative Echo on 10/27/20 showed EF 60%, normally functioning TAVR with a mean gradient of 12 mmHg and trivial PVL  Anxiety - Continue as needed Ativan   Objective Vital signs were reviewed and unremarkable.  Vitals:   11/03/20 0105 11/03/20 0341 11/03/20 1100 11/03/20 1241  BP:  (!) 154/56  (!) 158/50  Pulse:  70  64  Resp:  18  18  Temp:  98.8 F (37.1 C)  98.4 F (36.9 C)  TempSrc:  Oral  Oral  SpO2:  99% 96% 100%  Weight: 87.2 kg     Height:       87.2 kg  Exam General appearance: alert, cooperative, and no distress Head: Normocephalic, without obvious abnormality, atraumatic Eyes:  EOMI Lungs: clear to auscultation bilaterally Heart: regular rate and rhythm, S1, S2 normal, and 3/6 HSM appreciated in USBs Abdomen: normal  findings: bowel sounds normal and soft, non-tender Extremities:  trace edema in RLE Skin: mobility and turgor normal Neurologic: Grossly normal  Labs / Other Information My review of labs, imaging, notes and other tests is significant for Creat 1.01    Time spent: Greater than 50% of the 35 minute visit was spent in counseling/coordination of care for the patient as laid out in the A&P.  Lewie Chamber, MD [facility] 11/03/2020, 1:43 PM

## 2020-11-03 NOTE — Assessment & Plan Note (Addendum)
-   Last A1c 5.8% on 10/26/2020 -On metformin and Amaryl at home -Amaryl discontinued at discharge - Patient continued on lower dose of metformin, 500 mg twice daily and ongoing diet control

## 2020-11-03 NOTE — Assessment & Plan Note (Addendum)
-   continued on toprol at discharge

## 2020-11-03 NOTE — Assessment & Plan Note (Addendum)
-   Continue amiodarone and Eliquis -Toprol started per cardiology, HR tolerating

## 2020-11-04 ENCOUNTER — Ambulatory Visit: Payer: Medicare Other | Admitting: Physician Assistant

## 2020-11-04 DIAGNOSIS — N179 Acute kidney failure, unspecified: Secondary | ICD-10-CM | POA: Diagnosis not present

## 2020-11-04 DIAGNOSIS — J9601 Acute respiratory failure with hypoxia: Secondary | ICD-10-CM | POA: Diagnosis not present

## 2020-11-04 DIAGNOSIS — I480 Paroxysmal atrial fibrillation: Secondary | ICD-10-CM | POA: Diagnosis not present

## 2020-11-04 MED ORDER — LOSARTAN POTASSIUM 25 MG PO TABS: 25.0000 mg | ORAL_TABLET | Freq: Every day | ORAL | 3 refills | Status: DC

## 2020-11-04 MED ORDER — AMLODIPINE BESYLATE 10 MG PO TABS: 10.0000 mg | ORAL_TABLET | Freq: Every day | ORAL | 3 refills | Status: DC

## 2020-11-04 MED ORDER — METOPROLOL SUCCINATE ER 25 MG PO TB24: 12.5000 mg | ORAL_TABLET | Freq: Every day | ORAL | 3 refills | Status: DC

## 2020-11-04 MED ORDER — METFORMIN HCL 500 MG PO TABS: 500.0000 mg | ORAL_TABLET | Freq: Two times a day (BID) | ORAL | 3 refills | Status: DC

## 2020-11-04 NOTE — Progress Notes (Addendum)
Mobility Specialist - Progress Note    11/04/20 1028  Oxygen Therapy  SpO2 95 %  O2 Device Room Air  Mobility  Activity Ambulated in hall  Level of Assistance Standby assist, set-up cues, supervision of patient - no hands on  Assistive Device Front wheel walker  Distance Ambulated (ft) 450 ft  Mobility Ambulated with assistance in hallway  Mobility Response Tolerated well  Mobility performed by Mobility specialist  $Mobility charge 1 Mobility    Pre-mobility: 79 HR, 97% SpO2 During mobility: 91 HR, 95% SpO2 Post-mobility: 81 HR, 96% SPO2  Upon entry pt was eager to ambulate and used RW to walk 450 ft in hallway. Pt did c/o of SOB during session and required verbal cues to avoid drifting towards walls. SpO2 levels remained in upper 90s and HR recorded above. Pt returned to room and sat in recliner after session and was left in chair with call bell at side and RN in room.   Arliss Journey Mobility Specialist [facility] Phone: [phone] 11/04/20, 10:30 AM

## 2020-11-04 NOTE — Discharge Summary (Signed)
Physician Discharge Summary   Patient name: Mary Mccarthy  Admit date:     11/01/2020  Discharge date: 11/04/2020  Attending Physician: Eben Burow [[id number]]  Discharge Physician: Dwyane Dee   PCP: Biagio Borg, MD    Follow-up Information     Biagio Borg, MD Follow up.   Specialties: Internal Medicine, Radiology Contact information: [place] [address] [phone]                 Recommendations at discharge: Follow up with cardiology   Discharge Diagnoses Principal Problem:   AKI (acute kidney injury) ([place]) Active Problems:   Paroxysmal atrial fibrillation (HCC)   Essential hypertension   Chronic diastolic heart failure (HCC)   Anxiety   S/P TAVR (transcatheter aortic valve replacement)   Diabetes mellitus type 2 in obese [facility])   Resolved Diagnoses Resolved Problems:   Acute respiratory failure with hypoxia [facility])   Hospital Course   Mary Mccarthy is an 80 y.o. female with medical history significant for HTN, DMT2, PAF, CAD, s/p TAVR recently, HLD who was admitted recently for a-fib with RVR and had GI bleed. Underwent TAVR while in hospital and was discharged on eliquis on 10/28/20.   Presented with complaint of persistent nausea and vomiting and unable to tolerate any p.o. intake for the last 24 hours.  She has not had any fever or chills.  She denies any chest pain, orthopnea, shortness of breath, abdominal pain or diarrhea.  She denies any urinary frequency or dysuria.  She reports feeling very anxious and jittery.  She was given Ativan in the emergency room which seemed to help her.  She was found on labs to have an acute kidney injury.   She responded well to IVF with improvement in her renal function. Cardiology was consulted due to history of bradycardia with beta-blockers and recent TAVR.  See below for further A&P.    * AKI (acute kidney injury) ([place]) - possibly has CKDII but renal function needs to further  stabilize; has fluctuated too much recently  - EF has improved on recent echo, concerned for some possible overdiuresis with home Lasix dose as renal function has corrected with fluids - will discuss with cardiology prior to d/c - baseline creatinine ~ 1.1 - patient presents with increase in creat >0.3 mg/dL above baseline, creat increase >1.5x baseline presumed to have occurred within past 7 days PTA -Creatinine 1.52 on admission - creat returned to 1.01 at discharge; GFR 56   Paroxysmal atrial fibrillation (HCC) - Continue amiodarone and Eliquis -Toprol started per cardiology, HR tolerating   Essential hypertension - continued on toprol at discharge   Chronic diastolic heart failure ([place]) - No signs of decompensation or volume overload - Continue current regimen  Acute respiratory failure with hypoxia (HCC)-resolved as of 11/03/2020 - not on home O2 - walk test performed and patient able to be weaned off O2 to RA on 9/28  Diabetes mellitus type 2 in obese (HCC) - Last A1c 5.8% on 10/26/2020 -On metformin and Amaryl at home -Amaryl discontinued at discharge - Patient continued on lower dose of metformin, 500 mg twice daily and ongoing diet control  S/P TAVR (transcatheter aortic valve replacement) TAVR with a 26 mm Medtronic Evolut Pro + THV via the TF approach on 10/26/20. Post operative Echo on 10/27/20 showed EF 60%, normally functioning TAVR with a mean gradient of 12 mmHg and trivial PVL  Anxiety - Continue as needed Ativan  Procedures performed:    Condition at discharge: stable  Exam  General appearance: alert, cooperative, and no distress Head: Normocephalic, without obvious abnormality, atraumatic Eyes:  EOMI Lungs: clear to auscultation bilaterally Heart: regular rate and rhythm, S1, S2 normal, and 3/6 HSM appreciated in USBs Abdomen: normal findings: bowel sounds normal and soft, non-tender Extremities:  trace edema in RLE Skin: mobility and turgor  normal Neurologic: Grossly normal  Disposition: Home  Discharge time: greater than 30 minutes. Allergies as of 11/04/2020       Reactions   Ibuprofen Other (See Comments)   Bleeding events        Medication List     STOP taking these medications    glimepiride 2 MG tablet Commonly known as: AMARYL   hydrALAZINE 100 MG tablet Commonly known as: APRESOLINE       TAKE these medications    acetaminophen 500 MG tablet Commonly known as: TYLENOL Take 500 mg by mouth every 6 (six) hours as needed for moderate pain.   ALPRAZolam 0.25 MG tablet Commonly known as: XANAX Take 1 tablet (0.25 mg total) by mouth 3 (three) times daily as needed for anxiety.   amiodarone 200 MG tablet Commonly known as: PACERONE Take 1 tablet (200 mg total) by mouth daily.   amLODipine 10 MG tablet Commonly known as: NORVASC Take 1 tablet (10 mg total) by mouth daily.   apixaban 5 MG Tabs tablet Commonly known as: ELIQUIS Take 1 tablet (5 mg total) by mouth 2 (two) times daily.   atorvastatin 40 MG tablet Commonly known as: LIPITOR TAKE 1 TABLET BY MOUTH EVERY DAY   blood glucose meter kit and supplies Dispense based on patient and insurance preference. Use up to four times daily as directed. E11.9   cyanocobalamin 1000 MCG tablet Commonly known as: CVS VITAMIN B12 Take 1 tablet (1,000 mcg total) by mouth daily.   furosemide 40 MG tablet Commonly known as: Lasix Take 1 tablet (40 mg total) by mouth daily.   losartan 25 MG tablet Commonly known as: COZAAR Take 1 tablet (25 mg total) by mouth at bedtime.   metFORMIN 500 MG tablet Commonly known as: GLUCOPHAGE Take 1 tablet (500 mg total) by mouth 2 (two) times daily with a meal. TAKE 1 TAB BY MOUTH IN THE AM,AND 1/2 TAB IN THE PM What changed:  medication strength how much to take how to take this when to take this   metoprolol succinate 25 MG 24 hr tablet Commonly known as: TOPROL-XL Take 0.5 tablets (12.5 mg total) by  mouth daily. Start taking on: November 05, 2020   ondansetron 4 MG tablet Commonly known as: Zofran Take 1 tablet (4 mg total) by mouth every 8 (eight) hours as needed for nausea or vomiting.   ONE TOUCH ULTRA 2 w/Device Kit Use as directed daily   OneTouch Delica Plus [id number] Misc USE AS DIRECTED TEST 1-2 TIMES A DAY   OneTouch Ultra test strip Generic drug: glucose blood USE AS INSTRUCTED TEST 1-2 TIMES A DAY   pantoprazole 40 MG tablet Commonly known as: PROTONIX Take 1 tablet (40 mg total) by mouth 2 (two) times daily.   potassium chloride SA 20 MEQ tablet Commonly known as: KLOR-CON Take 1 tablet (20 mEq total) by mouth daily.        DG Chest 2 View  Result Date: 10/25/2020 CLINICAL DATA:  Preop. EXAM: CHEST - 2 VIEW COMPARISON:  Chest x-ray 10/18/2020. FINDINGS: Examination is limited secondary to patient rotation. The  heart size and mediastinal contours are within normal limits. Both lungs are clear. Degenerative changes affect the spine. IMPRESSION: No active cardiopulmonary disease. Electronically Signed   By: Ronney Asters M.D.   On: 10/25/2020 21:36   DG Chest 2 View  Result Date: 10/18/2020 CLINICAL DATA:  Dyspnea. EXAM: CHEST - 2 VIEW COMPARISON:  Chest x-ray 10/06/2020. FINDINGS: There are some patchy opacities in the right middle lobe/right infrahilar region. The lungs otherwise appear clear. There is no pleural effusion or pneumothorax. The heart is mildly enlarged, unchanged. Mediastinal silhouette is within normal limits. No acute fractures are seen. There are degenerative changes of the thoracic spine. IMPRESSION: 1. Minimal right middle lobe/infrahilar atelectasis/airspace disease. 2. Recommend follow-up PA and lateral chest x-ray and 4-6 weeks to confirm resolution. Electronically Signed   By: Ronney Asters M.D.   On: 10/18/2020 19:54   DG Chest 2 View  Result Date: 10/07/2020 CLINICAL DATA:  Anxiety, dyspnea on exertion, CHF EXAM: CHEST - 2 VIEW  COMPARISON:  09/27/2020 FINDINGS: Stable cardiomegaly with minor basilar atelectasis. No focal pneumonia, collapse or consolidation. Negative for edema, large effusion. Trachea midline. Aorta atherosclerotic. Degenerative changes of the spine. IMPRESSION: Stable cardiomegaly and basilar atelectasis. No interval change or acute process by plain radiography Aortic Atherosclerosis (ICD10-I70.0). Electronically Signed   By: Jerilynn Mages.  Shick M.D.   On: 10/07/2020 16:11   DG Chest Port 1 View  Result Date: 11/01/2020 CLINICAL DATA:  Weakness nausea vomiting EXAM: PORTABLE CHEST 1 VIEW COMPARISON:  10/26/2020 FINDINGS: Electronic device over the left chest. Redemonstrated TAVR. No focal opacity, pleural effusion or pneumothorax. Mild cardiomegaly. IMPRESSION: No active disease.  Mild cardiomegaly Electronically Signed   By: Donavan Foil M.D.   On: 11/01/2020 19:46   DG Chest Port 1 View  Result Date: 10/26/2020 CLINICAL DATA:  Status post TAVR EXAM: PORTABLE CHEST 1 VIEW COMPARISON:  10/25/2020 FINDINGS: Mild cardiomegaly. Interval placement of aortic valve stent endograft. Both lungs are clear. The visualized skeletal structures are unremarkable. IMPRESSION: Mild cardiomegaly. Interval placement of aortic valve stent endograft. No acute abnormality of the lungs. Electronically Signed   By: Eddie Candle M.D.   On: 10/26/2020 11:45   ECHOCARDIOGRAM COMPLETE  Result Date: 11/02/2020    ECHOCARDIOGRAM REPORT   Patient Name:   ELON LOMELI Date of Exam: 11/02/2020 Medical Rec #:  [id number]      Height:       61.0 in Accession #:    [id number]     Weight:       190.3 lb Date of Birth:  07-01-1940      BSA:          1.849 m Patient Age:    73 years       BP:           140/57 mmHg Patient Gender: F              HR:           76 bpm. Exam Location:  Inpatient Procedure: 2D Echo, Intracardiac Opacification Agent, Cardiac Doppler and Color            Doppler Indications:    Elevated Troponin  History:        Patient has prior  history of Echocardiogram examinations, most                 recent 10/27/2020. CAD, Pulmonary HTN, Arrythmias:Atrial  Fibrillation; Risk Factors:Hypertension and Diabetes.                 Aortic Valve: 26 mm Sapien prosthetic, stented (TAVR) valve is                 present in the aortic position. Procedure Date: 10/26/2020.  Sonographer:    Darlina Sicilian RDCS Referring Phys: [id number] Margie Billet IMPRESSIONS  1. Left ventricular ejection fraction, by estimation, is 60 to 65%. The left ventricle has normal function. The left ventricle has no regional wall motion abnormalities. There is mild left ventricular hypertrophy. Left ventricular diastolic parameters are indeterminate.  2. Right ventricular systolic function is normal. The right ventricular size is normal.  3. Left atrial size was moderately dilated.  4. The mitral valve is normal in structure. No evidence of mitral valve regurgitation. No evidence of mitral stenosis.  5. The aortic valve has been repaired/replaced. Aortic valve regurgitation is not visualized. No aortic stenosis is present. There is a 26 mm Sapien prosthetic (TAVR) valve present in the aortic position. Procedure Date: 10/26/2020. Echo findings are consistent with normal structure and function of the aortic valve prosthesis. Aortic valve area, by VTI measures 1.37 cm. Aortic valve mean gradient measures 9.0 mmHg. Aortic valve Vmax measures 2.16 m/s.  6. The inferior vena cava is normal in size with greater than 50% respiratory variability, suggesting right atrial pressure of 3 mmHg. FINDINGS  Left Ventricle: Left ventricular ejection fraction, by estimation, is 60 to 65%. The left ventricle has normal function. The left ventricle has no regional wall motion abnormalities. Definity contrast agent was given IV to delineate the left ventricular  endocardial borders. The left ventricular internal cavity size was normal in size. There is mild left ventricular hypertrophy. Left  ventricular diastolic parameters are indeterminate. Right Ventricle: The right ventricular size is normal. No increase in right ventricular wall thickness. Right ventricular systolic function is normal. Left Atrium: Left atrial size was moderately dilated. Right Atrium: Right atrial size was normal in size. Pericardium: There is no evidence of pericardial effusion. Mitral Valve: The mitral valve is normal in structure. Mild to moderate mitral annular calcification. No evidence of mitral valve regurgitation. No evidence of mitral valve stenosis. Tricuspid Valve: The tricuspid valve is normal in structure. Tricuspid valve regurgitation is not demonstrated. No evidence of tricuspid stenosis. Aortic Valve: The aortic valve has been repaired/replaced. Aortic valve regurgitation is not visualized. No aortic stenosis is present. Aortic valve mean gradient measures 9.0 mmHg. Aortic valve peak gradient measures 18.7 mmHg. Aortic valve area, by VTI  measures 1.37 cm. There is a 26 mm Sapien prosthetic, stented (TAVR) valve present in the aortic position. Procedure Date: 10/26/2020. Echo findings are consistent with normal structure and function of the aortic valve prosthesis. Pulmonic Valve: The pulmonic valve was normal in structure. Pulmonic valve regurgitation is not visualized. No evidence of pulmonic stenosis. Aorta: The aortic root is normal in size and structure. Venous: The inferior vena cava is normal in size with greater than 50% respiratory variability, suggesting right atrial pressure of 3 mmHg. IAS/Shunts: No atrial level shunt detected by color flow Doppler.  LEFT VENTRICLE PLAX 2D LVIDd:         4.30 cm  Diastology LVIDs:         2.30 cm  LV e' medial:    6.12 cm/s LV PW:         1.30 cm  LV E/e' medial:  21.6 LV IVS:  1.30 cm  LV e' lateral:   5.98 cm/s LVOT diam:     1.75 cm  LV E/e' lateral: 22.1 LV SV:         57 LV SV Index:   31 LVOT Area:     2.41 cm  LEFT ATRIUM         Index LA diam:    5.10 cm  2.76 cm/m  AORTIC VALVE AV Area (Vmax):    1.26 cm AV Area (Vmean):   1.31 cm AV Area (VTI):     1.37 cm AV Vmax:           216.50 cm/s AV Vmean:          137.000 cm/s AV VTI:            0.415 m AV Peak Grad:      18.7 mmHg AV Mean Grad:      9.0 mmHg LVOT Vmax:         113.00 cm/s LVOT Vmean:        74.400 cm/s LVOT VTI:          0.236 m LVOT/AV VTI ratio: 0.57 MITRAL VALVE MV Area (PHT): 2.95 cm     SHUNTS MV Decel Time: 257 msec     Systemic VTI:  0.24 m MV E velocity: 132.00 cm/s  Systemic Diam: 1.75 cm MV A velocity: 114.00 cm/s MV E/A ratio:  1.16 Candee Furbish MD Electronically signed by Candee Furbish MD Signature Date/Time: 11/02/2020/2:03:50 PM    Final    ECHOCARDIOGRAM COMPLETE  Result Date: 10/27/2020    ECHOCARDIOGRAM REPORT   Patient Name:   [name] Date of Exam: 10/27/2020 Medical Rec #:  [id number]      Height:       61.0 in Accession #:    [id number]     Weight:       193.1 lb Date of Birth:  February 06, 1941      BSA:          1.861 m Patient Age:    80 years       BP:           164/45 mmHg Patient Gender: F              HR:           56 bpm. Exam Location:  Inpatient Procedure: 2D Echo, 3D Echo, Cardiac Doppler, Color Doppler and Intracardiac            Opacification Agent Indications:    Post TAVR evaluation V43.3 / Z95.2  History:        Patient has prior history of Echocardiogram examinations, most                 recent 10/26/2020. CHF, Stroke; Risk Factors:Hypertension.                 Bradycardia S/P TAVR implantation. History of Paroxysmal atrial                 fibrillation. Acute blood loss anemia secondary to gastritis.  Sonographer:    Darlina Sicilian RDCS Referring Phys: [id number] [place]  1. Left ventricular ejection fraction, by estimation, is 60 to 65%. The left ventricle has normal function. The left ventricle has no regional wall motion abnormalities. There is mild left ventricular hypertrophy. Left ventricular diastolic parameters are indeterminate.  2.  Right ventricular systolic function is normal. The right ventricular size is normal.  3. Left  atrial size was severely dilated.  4. Right atrial size was mildly dilated.  5. The mitral valve is degenerative. Mild mitral valve regurgitation. Severe mitral annular calcification.  6. Post TAVR with 26 mm supra annular Medtronic Evolut Pro valve trivial PvL mean gradient 12 peak 24 mmHg AVA 1.8 cm2 with DVI 0.63. The aortic valve has been repaired/replaced. Aortic valve regurgitation is trivial. Procedure Date: 10/26/2020. FINDINGS  Left Ventricle: Left ventricular ejection fraction, by estimation, is 60 to 65%. The left ventricle has normal function. The left ventricle has no regional wall motion abnormalities. Definity contrast agent was given IV to delineate the left ventricular  endocardial borders. The left ventricular internal cavity size was normal in size. There is mild left ventricular hypertrophy. Left ventricular diastolic parameters are indeterminate. Right Ventricle: The right ventricular size is normal. No increase in right ventricular wall thickness. Right ventricular systolic function is normal. Left Atrium: Left atrial size was severely dilated. Right Atrium: Right atrial size was mildly dilated. Pericardium: There is no evidence of pericardial effusion. Mitral Valve: The mitral valve is degenerative in appearance. There is moderate thickening of the mitral valve leaflet(s). There is moderate calcification of the mitral valve leaflet(s). Severe mitral annular calcification. Mild mitral valve regurgitation. Tricuspid Valve: The tricuspid valve is normal in structure. Tricuspid valve regurgitation is mild. Aortic Valve: Post TAVR with 26 mm supra annular Medtronic Evolut Pro valve trivial PvL mean gradient 12 peak 24 mmHg AVA 1.8 cm2 with DVI 0.63. The aortic valve has been repaired/replaced. Aortic valve regurgitation is trivial. Aortic valve mean gradient measures 12.0 mmHg. Aortic valve peak gradient  measures 24.1 mmHg. Aortic valve area, by VTI measures 1.79 cm. There is a 26 mm CoreValve-Evolut Pro prosthetic, stented (TAVR) valve present in the aortic position. Pulmonic Valve: The pulmonic valve was normal in structure. Pulmonic valve regurgitation is mild. Aorta: The aortic root is normal in size and structure. IAS/Shunts: No atrial level shunt detected by color flow Doppler.  LEFT VENTRICLE PLAX 2D LVIDd:         4.60 cm      Diastology LVIDs:         3.00 cm      LV e' medial:    3.30 cm/s LV PW:         1.00 cm      LV E/e' medial:  30.9 LV IVS:        1.30 cm      LV e' lateral:   2.93 cm/s LVOT diam:     1.90 cm      LV E/e' lateral: 34.8 LV SV:         88 LV SV Index:   47 LVOT Area:     2.84 cm  LV Volumes (MOD) LV vol d, MOD A2C: 113.0 ml LV vol d, MOD A4C: 130.0 ml LV vol s, MOD A2C: 23.2 ml LV vol s, MOD A4C: 37.4 ml LV SV MOD A2C:     89.8 ml LV SV MOD A4C:     130.0 ml LV SV MOD BP:      93.2 ml LEFT ATRIUM             Index LA diam:        5.30 cm 2.85 cm/m LA Vol (A2C):   47.4 ml 25.47 ml/m LA Vol (A4C):   36.0 ml 19.35 ml/m LA Biplane Vol: 43.3 ml 23.27 ml/m  AORTIC VALVE AV Area (Vmax):    1.66 cm AV  Area (Vmean):   1.70 cm AV Area (VTI):     1.79 cm AV Vmax:           245.33 cm/s AV Vmean:          155.000 cm/s AV VTI:            0.490 m AV Peak Grad:      24.1 mmHg AV Mean Grad:      12.0 mmHg LVOT Vmax:         144.00 cm/s LVOT Vmean:        92.800 cm/s LVOT VTI:          0.309 m LVOT/AV VTI ratio: 0.63 MITRAL VALVE MV Area (PHT): 2.80 cm     SHUNTS MV Decel Time: 271 msec     Systemic VTI:  0.31 m MV E velocity: 102.00 cm/s  Systemic Diam: 1.90 cm MV A velocity: 105.00 cm/s MV E/A ratio:  0.97 Jenkins Rouge MD Electronically signed by Jenkins Rouge MD Signature Date/Time: 10/27/2020/6:26:40 PM    Final    ECHOCARDIOGRAM LIMITED  Result Date: 10/26/2020    ECHOCARDIOGRAM LIMITED REPORT   Patient Name:   ANH BIGOS Date of Exam: 10/26/2020 Medical Rec #:  [id number]       Height:       61.0 in Accession #:    [id number]     Weight:       193.1 lb Date of Birth:  March 15, 1940      BSA:          1.861 m Patient Age:    36 years       BP:           150/75 mmHg Patient Gender: F              HR:           51 bpm. Exam Location:  Inpatient Procedure: Limited Echo, Color Doppler and Cardiac Doppler Indications:     Aortic Stenosis i35.0  History:         Patient has prior history of Echocardiogram examinations, most                  recent 08/10/2020. Arrythmias:Atrial Fibrillation; Risk                  Factors:Hypertension, Diabetes and Dyslipidemia.  Sonographer:     Raquel Sarna Senior RDCS Referring Phys:  [id number] Woodfin Ganja THOMPSON Diagnosing Phys: Jenkins Rouge MD  Sonographer Comments: 71m Medtronic Evolut Pro Plus TAVR Implanted IMPRESSIONS  1. Left ventricular ejection fraction, by estimation, is 60 to 65%. The left ventricle has normal function.  2. Right ventricular systolic function is normal. The right ventricular size is normal.  3. Left atrial size was moderately dilated.  4. The mitral valve is degenerative. Mild mitral valve regurgitation. Severe mitral annular calcification.  5. Pre TAVR: poorly visualized AV severely calcified with restricted motion mean gradient 37 peak 60 mmHg AVA 0.72 cm2         Post TAVR: Well positioned 26 mm Medtronic supra annular Evolut Pro valve no PVL mean gradient 14 peak 24 mmHg AVR 1.58 cm2 Aortic root angiography post deployment showed normal flow to RCA and LM/LAD coronary arteries . The aortic valve has been repaired/replaced. FINDINGS  Left Ventricle: Left ventricular ejection fraction, by estimation, is 60 to 65%. The left ventricle has normal function. The left ventricular internal cavity size was normal in size. Right Ventricle: The right ventricular size  is normal. No increase in right ventricular wall thickness. Right ventricular systolic function is normal. Left Atrium: Left atrial size was moderately dilated. Pericardium: There is no  evidence of pericardial effusion. Mitral Valve: The mitral valve is degenerative in appearance. There is severe thickening of the mitral valve leaflet(s). There is severe calcification of the mitral valve leaflet(s). Severe mitral annular calcification. Mild mitral valve regurgitation. Tricuspid Valve: The tricuspid valve is normal in structure. Tricuspid valve regurgitation is mild. Aortic Valve: Pre TAVR: poorly visualized AV severely calcified with restricted motion mean gradient 37 peak 60 mmHg AVA 0.72 cm2 Post TAVR: Well positioned 26 mm Medtronic supra annular Evolut Pro valve no PVL mean gradient 14 peak 24 mmHg AVR 1.58 cm2 Aortic root angiography post deployment showed normal flow to RCA and LM/LAD coronary arteries. The aortic valve has been repaired/replaced. Aortic valve mean gradient measures 12.0 mmHg. Aortic valve peak gradient measures 20.2 mmHg. Aortic valve area, by VTI measures 1.66 cm. LEFT VENTRICLE PLAX 2D LVOT diam:     2.00 cm LV SV:         68 LV SV Index:   36 LVOT Area:     3.14 cm  AORTIC VALVE AV Area (Vmax):    1.37 cm AV Area (Vmean):   1.37 cm AV Area (VTI):     1.66 cm AV Vmax:           224.50 cm/s AV Vmean:          166.000 cm/s AV VTI:            0.407 m AV Peak Grad:      20.2 mmHg AV Mean Grad:      12.0 mmHg LVOT Vmax:         97.90 cm/s LVOT Vmean:        72.200 cm/s LVOT VTI:          0.215 m LVOT/AV VTI ratio: 0.53  SHUNTS Systemic VTI:  0.22 m Systemic Diam: 2.00 cm Jenkins Rouge MD Electronically signed by Jenkins Rouge MD Signature Date/Time: 10/26/2020/10:55:34 AM    Final    Structural Heart Procedure  Result Date: 10/26/2020 See surgical note for result.  Results for orders placed or performed during the hospital encounter of 10/18/20  Culture, blood (routine x 2)     Status: None   Collection Time: 10/19/20  8:37 AM   Specimen: BLOOD  Result Value Ref Range Status   Specimen Description BLOOD RIGHT ANTECUBITAL  Final   Special Requests   Final     BOTTLES DRAWN AEROBIC AND ANAEROBIC Blood Culture results may not be optimal due to an inadequate volume of blood received in culture bottles   Culture   Final    NO GROWTH 5 DAYS Performed at [facility] [address] [place], [place] [address]    Report Status 10/24/2020 FINAL  Final  Culture, blood (routine x 2)     Status: None   Collection Time: 10/19/20  8:40 AM   Specimen: BLOOD LEFT FOREARM  Result Value Ref Range Status   Specimen Description BLOOD LEFT FOREARM  Final   Special Requests   Final    BOTTLES DRAWN AEROBIC AND ANAEROBIC Blood Culture adequate volume   Culture   Final    NO GROWTH 5 DAYS Performed at [facility], [place] [address] [place], [place] [address]    Report Status 10/24/2020 FINAL  Final  Resp Panel by RT-PCR (Flu A&B, Covid)  Nasopharyngeal Swab     Status: None   Collection Time: 10/19/20  9:29 AM   Specimen: Nasopharyngeal Swab; Nasopharyngeal(NP) swabs in vial transport medium  Result Value Ref Range Status   SARS Coronavirus 2 by RT PCR NEGATIVE NEGATIVE Final    Comment: (NOTE) SARS-CoV-2 target nucleic acids are NOT DETECTED.  The SARS-CoV-2 RNA is generally detectable in upper respiratory specimens during the acute phase of infection. The lowest concentration of SARS-CoV-2 viral copies this assay can detect is 138 copies/mL. A negative result does not preclude SARS-Cov-2 infection and should not be used as the sole basis for treatment or other patient management decisions. A negative result may occur with  improper specimen collection/handling, submission of specimen other than nasopharyngeal swab, presence of viral mutation(s) within the areas targeted by this assay, and inadequate number of viral copies(<138 copies/mL). A negative result must be combined with clinical observations, patient history, and epidemiological information. The expected result is Negative.  Fact Sheet for Patients:   EntrepreneurPulse.com.au  Fact Sheet for Healthcare Providers:  IncredibleEmployment.be  This test is no t yet approved or cleared by the [place] FDA and  has been authorized for detection and/or diagnosis of SARS-CoV-2 by FDA under an Emergency Use Authorization (EUA). This EUA will remain  in effect (meaning this test can be used) for the duration of the COVID-19 declaration under Section 564(b)(1) of the Act, 21 U.S.C.section 360bbb-3(b)(1), unless the authorization is terminated  or revoked sooner.       Influenza A by PCR NEGATIVE NEGATIVE Final   Influenza B by PCR NEGATIVE NEGATIVE Final    Comment: (NOTE) The Xpert Xpress SARS-CoV-2/FLU/RSV plus assay is intended as an aid in the diagnosis of influenza from Nasopharyngeal swab specimens and should not be used as a sole basis for treatment. Nasal washings and aspirates are unacceptable for Xpert Xpress SARS-CoV-2/FLU/RSV testing.  Fact Sheet for Patients: EntrepreneurPulse.com.au  Fact Sheet for Healthcare Providers: IncredibleEmployment.be  This test is not yet approved or cleared by the [place] FDA and has been authorized for detection and/or diagnosis of SARS-CoV-2 by FDA under an Emergency Use Authorization (EUA). This EUA will remain in effect (meaning this test can be used) for the duration of the COVID-19 declaration under Section 564(b)(1) of the Act, 21 U.S.C. section 360bbb-3(b)(1), unless the authorization is terminated or revoked.  Performed at [facility], [place] [address] [place], [place] [address]   Surgical pcr screen     Status: None   Collection Time: 10/25/20  8:24 PM   Specimen: Nasal Mucosa; Nasal Swab  Result Value Ref Range Status   MRSA, PCR NEGATIVE NEGATIVE Final   Staphylococcus aureus NEGATIVE NEGATIVE Final    Comment: (NOTE) The Xpert SA Assay (FDA approved for NASAL specimens in patients  39 years of age and older), is one component of a comprehensive surveillance program. It is not intended to diagnose infection nor to guide or monitor treatment. Performed at [facility], [place] [address], [place], [place] [address]     Signed:  Dwyane Dee MD.  [facility] 11/04/2020, 3:45 PM

## 2020-11-05 ENCOUNTER — Telehealth: Payer: Self-pay

## 2020-11-05 NOTE — Telephone Encounter (Signed)
Transition Care Management Unsuccessful Follow-up Telephone Call  Date of discharge and from where:  11/04/2020 from [facility]  Attempts:  1st Attempt  Reason for unsuccessful TCM follow-up call:  No answer/busy

## 2020-11-05 NOTE — Telephone Encounter (Signed)
Transition Care Management Follow-up Telephone Call Date of discharge and from where: 11/04/2020 from [facility] How have you been since you were released from the hospital? Having some breathing issues especially when laying in bed.  (Advised patient to get a pulse oximetry from a local pharmacy to check oxygen levels; also advised to maybe sit in a recliner so that she is not laying flat all the time).  Any questions or concerns? No  Items Reviewed: Did the pt receive and understand the discharge instructions provided? Yes  Medications obtained and verified? Yes  Other? Yes  Any new allergies since your discharge? No  Dietary orders reviewed? Yes, low sodium heart healthy  Do you have support at home? Yes , son and family lives nearby  Home Care and Equipment/Supplies: Were home health services ordered? no If so, what is the name of the agency? no  Has the agency set up a time to come to the patient's home? no Were any new equipment or medical supplies ordered?  No What is the name of the medical supply agency? none Were you able to get the supplies/equipment? no Do you have any questions related to the use of the equipment or supplies? No  Functional Questionnaire: (I = Independent and D = Dependent) ADLs: I  Bathing/Dressing- I  Meal Prep- I  Eating- I  Maintaining continence- I  Transferring/Ambulation- I, with walker or cane  Managing Meds- I  Follow up appointments reviewed:  PCP Hospital f/u appt confirmed? Yes  Scheduled to see Dr. [name] on 11/08/2020 @ 2:00 pm. Specialist Hospital f/u appt confirmed? No   Are transportation arrangements needed? No  If their condition worsens, is the pt aware to call PCP or go to the Emergency Dept.? Yes Was the patient provided with contact information for the PCP's office or ED? Yes Was to pt encouraged to call back with questions or concerns? Yes

## 2020-11-08 ENCOUNTER — Inpatient Hospital Stay: Payer: Medicare Other | Admitting: Internal Medicine

## 2020-11-10 NOTE — Progress Notes (Signed)
HEART AND [place]                                     Cardiology Office Note:    Date:  11/11/2020   ID:  Mary Mccarthy, DOB [birth year], MRN [id number]  PCP:  Biagio Borg, MD  [facility] Cardiologist:  Glenetta Hew, MD / Dr. Angelena Form & Dr. Cyndia Bent (TAVR) [facility] Electrophysiologist:  None   Referring MD: Biagio Borg, MD   Post hospital follow up   History of Present Illness:    Mary Mccarthy is a 80 y.o. female with a hx of obesity, DMT2, non obstructive CAD, PUD/AVM with recent GI bleed, PAF on amio and Eliquis (initially diagnosed at the time of acute CVA in 08/2020), HTN, pulmonary HTN, chronic diastolic CHF, and severe AS s/p TAVR (10/27/20) with readmission for AKI who presents to clinic for follow up.   She was admitted in 2017 with urosepsis, AKI and acute anemia with a hemoglobin of 5 requiring transfusion.  She underwent EGD and colonoscopy which showed several areas of potential GI bleeding but no current bleeding.  She was found to have AVMs and peptic ulcer disease.   She was admitted in July 2022 with a stroke and diagnosed with new atrial fibrillation at that time.  MRI of the brain showed small acute infarct diffusion bilaterally. She was started on Eliquis.  Echo at that time showed EF 55 to 60%, moderately elevated pulmonary hypertension, severe concentric LVH, severe mitral annular calcification and severe aortic stenosis with a mean gradient of 42 mmHg, peak gradient of 76 mmHg, AVA of 0.73 cm, DVI 0.27.    She was then readmitted in August 2022 with acute on chronic diastolic CHF in the setting of severe aortic stenosis.  She was diuresed with IV Lasix.  She also had atrial fibrillation with RVR that converted to sinus with IV amiodarone.  Cardiology was consulted and she was seen by Dr. Burt Knack with the structural heart team.  She underwent left and right heart cath which showed nonobstructive CAD with severe  aortic stenosis with a mean gradient of 53 mmHg and pre TAVR CTs with plans for outpatient follow up.   However, she was readmitted 9/12-9/22/22 for symptomatic anemia 2/2 GI bleed. Hg 7.4 on admission. Transfused with 1 unit PRBCs. FOBT +. EDG 9/15 showed gastritis with no signs of active bleeding but stigmata of recent bleeding.  It was suspected that the patient's anemia and melena was due to bleeding from gastric erosions in the setting of Eliquis use. Colonoscopy was deferred given severe AS and risk with sedation. She underwent successful TAVR with a 26 mm Medtronic Evolut Pro + THV via the TF approach on 10/27/20. She developed CHB intraoperatively that ultimately resolved. Post operative echo showed EF 60%, normally functioning TAVR with a mean gradient of 12 mmHg and trivial PVL. She was discharged on Eliquis without concurrent antiplatelet therapy given recent GI bleeding. She was also discharged with a Zio AT. Coreg held at DC.   She was then readmitted 9/26-9/29/22 for n/v/diarrhea and AKI and lactic acidosis which improved with fluids. Hg stable 8-9. Repeat Echo 11/02/20 with EF 60-65%, no RMWA, no AI or AS, 26 mm Sapien prosthetic (TAVR) valve present in the aortic position, normal structure and function of the aortic valve prosthesis. Tele showed intermittent A fib RVR with ventricular  rate up to 130s and NSVT lasting up to 9 runs. She was started on metoprolol XL 12.$RemoveBeforeDE'5mg'mgqzBbSyEAfnDJz$ .   Today the patient presents to clinic for follow up. Doing better. She is still having significant dyspnea. Mostly with exertion as well as in a supine position. She feels anxious all the time. She feels anxious all the time. She needs a refill on her Xanax. Feels alone a lot. Wants to get back to doing normal things and getting out. No LE edema. Weight stable. No palpitations. No blood in stool or urine. No dizziness or syncope.    Past Medical History:  Diagnosis Date   ALLERGIC RHINITIS 06/24/2007   Anemia    AVM  (arteriovenous malformation) of colon    Blood transfusion without reported diagnosis 05/2015   COLONIC POLYPS, HX OF 06/24/2007   Coronary artery disease, non-occlusive 05/2015   Trop + w/ Acute Anemia =>CATH: small RI - Ostial 60%, ostial RCA 30% and dLAD 40-50%;; 9/'21: Cor Ca Score 624. Mild (25-49%) prox RCA & LAD,; Moderate (50-69%) Ostial Small RI & prox LCx.     DEPRESSION 10/08/2006   DIABETES MELLITUS, TYPE II 10/05/2006   GERD 06/24/2007   History of CVA (cerebrovascular accident)    08/2020- found to be in afib with RVR, started on Eliquis   HYPERLIPIDEMIA 10/08/2006   HYPERTENSION 10/08/2006   INSOMNIA 09/24/2008   Left knee DJD    Osteoporosis 03/10/2016   PAF (paroxysmal atrial fibrillation) ([place])    PEPTIC ULCER DISEASE 10/08/2006   S/P TAVR (transcatheter aortic valve replacement) 10/26/2020   s/p TAVR with a 26 mm Medtronic Evolut Pro+ via the TF approach by Dr. Angelena Form & Dr. Cyndia Bent   Severe aortic stenosis     Past Surgical History:  Procedure Laterality Date   APPENDECTOMY     BIOPSY  10/21/2020   Procedure: BIOPSY;  Surgeon: Sharyn Creamer, MD;  Location: [facility] ENDOSCOPY;  Service: Gastroenterology;;   BREAST BIOPSY     CARDIAC CATHETERIZATION N/A 05/21/2015   Procedure: Left Heart Cath and Coronary Angiography;  Surgeon: Leonie Man, MD;  Location: [place] CV LAB;  Service: Cardiovascular;: Ost RI 60%, Ost RCA 30%, dLAD tapers to small vessel w/ 40-50%. Mildly elevated LVEDP. Normal LV Fxn.   COLONOSCOPY N/A 05/20/2015   Procedure: COLONOSCOPY;  Surgeon: Manus Gunning, MD;  Location: [place];  Service: Gastroenterology;  Laterality: N/A;   CORONARY CA2+ SCORE / CARDIAC CT ANGIOGRAM  10/09/2019   Calcium score 624.  82nd percentile. Dominant RCA: Mild (25-49%) proximal stenosis-distal bifurcation into PDA and PAV--< RPL branches.  LAD (1 major mid vessel diagonal) diffuse calcified plaque, mild proximal stenosis with minimal distal stenosis.   Small RI moderate ostial disease.  LCx-moderate mixed (50-69%) proximal stenosis.  Small dOM1 disease.  Trileaflet AoV, annular Ca2+ - probable AS   ESOPHAGOGASTRODUODENOSCOPY N/A 05/20/2015   Procedure: ESOPHAGOGASTRODUODENOSCOPY (EGD);  Surgeon: Manus Gunning, MD;  Location: [place];  Service: Gastroenterology;  Laterality: N/A;   ESOPHAGOGASTRODUODENOSCOPY (EGD) WITH PROPOFOL N/A 10/21/2020   Procedure: ESOPHAGOGASTRODUODENOSCOPY (EGD) WITH PROPOFOL;  Surgeon: Sharyn Creamer, MD;  Location: [place];  Service: Gastroenterology;  Laterality: N/A;   INTRAOPERATIVE TRANSTHORACIC ECHOCARDIOGRAM N/A 10/26/2020   Procedure: INTRAOPERATIVE TRANSTHORACIC ECHOCARDIOGRAM;  Surgeon: Burnell Blanks, MD;  Location: [place];  Service: Open Heart Surgery;  Laterality: N/A;   RIGHT/LEFT HEART CATH AND CORONARY ANGIOGRAPHY N/A 09/29/2020   Procedure: RIGHT/LEFT HEART CATH AND CORONARY ANGIOGRAPHY;  Surgeon: Burnell Blanks, MD;  Location: [place] CV LAB;  Service: Cardiovascular;  Laterality: N/A;   TONSILLECTOMY     TRANSCATHETER AORTIC VALVE REPLACEMENT, TRANSFEMORAL N/A 10/26/2020   Procedure: TRANSCATHETER AORTIC VALVE REPLACEMENT, TRANSFEMORAL;  Surgeon: Burnell Blanks, MD;  Location: [place];  Service: Open Heart Surgery;  Laterality: N/A;   TRANSTHORACIC ECHOCARDIOGRAM  03/2019; 09/2019   a) EF 60 to 65%.  Moderate LVH.  GRII DD.  Mod-Severe AS (m grad 36 mmHg, peak 59 mmHg); b) EF 65-70%, No RWMA. Gr1 DD/hi LAP, Mild hi PAP. Mod LA Dil. MOD AS (mean Grad 34.5 mmHg).  STABLE    TRANSTHORACIC ECHOCARDIOGRAM  04/12/2017   EF 60-65%. No RWMA.  GR 1 DD.  Moderate concentric LVH.  Mild LA dilation. Severe calcified aortic valve with moderate-severe aortic stenosis (peak/mean gradients 60/34 mmHg) and in severe range by AVA (0.8 cm2).  Mild to moderately increased PA pressures 41 mmHg with normal RV size and function..     TUBAL LIGATION      Current Medications: Current  Meds  Medication Sig   acetaminophen (TYLENOL) 500 MG tablet Take 500 mg by mouth every 6 (six) hours as needed for moderate pain.   ALPRAZolam (XANAX) 0.25 MG tablet Take 1 tablet (0.25 mg total) by mouth 3 (three) times daily as needed for anxiety.   amiodarone (PACERONE) 200 MG tablet Take 1 tablet (200 mg total) by mouth daily.   amLODipine (NORVASC) 10 MG tablet Take 1 tablet (10 mg total) by mouth daily.   apixaban (ELIQUIS) 5 MG TABS tablet Take 1 tablet (5 mg total) by mouth 2 (two) times daily.   atorvastatin (LIPITOR) 40 MG tablet TAKE 1 TABLET BY MOUTH EVERY DAY   blood glucose meter kit and supplies Dispense based on patient and insurance preference. Use up to four times daily as directed. E11.9   Blood Glucose Monitoring Suppl (ONE TOUCH ULTRA 2) w/Device KIT Use as directed daily   cyanocobalamin ([employer] VITAMIN B12) 1000 MCG tablet Take 1 tablet (1,000 mcg total) by mouth daily.   furosemide (LASIX) 40 MG tablet Take 1 tablet (40 mg total) by mouth daily.   Lancets (ONETOUCH DELICA PLUS [id number]) MISC USE AS DIRECTED TEST 1-2 TIMES A DAY   losartan (COZAAR) 25 MG tablet Take 1 tablet (25 mg total) by mouth at bedtime.   metFORMIN (GLUCOPHAGE) 500 MG tablet Take 1 tablet (500 mg total) by mouth 2 (two) times daily with a meal. TAKE 1 TAB BY MOUTH IN THE AM,AND 1/2 TAB IN THE PM   metoprolol succinate (TOPROL-XL) 25 MG 24 hr tablet Take 0.5 tablets (12.5 mg total) by mouth daily.   ondansetron (ZOFRAN) 4 MG tablet Take 1 tablet (4 mg total) by mouth every 8 (eight) hours as needed for nausea or vomiting.   ONETOUCH ULTRA test strip USE AS INSTRUCTED TEST 1-2 TIMES A DAY   pantoprazole (PROTONIX) 40 MG tablet Take 1 tablet (40 mg total) by mouth 2 (two) times daily.   potassium chloride SA (KLOR-CON) 20 MEQ tablet Take 1 tablet (20 mEq total) by mouth daily.     Allergies:   Ibuprofen   Social History   Socioeconomic History   Marital status: Widowed    Spouse name: Not on file    Number of children: 1   Years of education: Not on file   Highest education level: Not on file  Occupational History   Occupation: retired - Freight forwarder    Employer: RETIRED  Tobacco Use  Smoking status: Never   Smokeless tobacco: Never  Vaping Use   Vaping Use: Never used  Substance and Sexual Activity   Alcohol use: No    Alcohol/week: 0.0 standard drinks   Drug use: No   Sexual activity: Not on file  Other Topics Concern   Not on file  Social History Narrative   Recently widowed--husband died in 11-27-2018.      Now lives alone but her son usually stays with her at night.  During the day she has 2 nephews who live nearby to come in and check on her intermittently.  Also one of her nieces calls routinely.      When her son is home and awake, she may try to walk on a treadmill, but is scared to walk outside.   Social Determinants of Health   Financial Resource Strain: Not on file  Food Insecurity: Not on file  Transportation Needs: Not on file  Physical Activity: Not on file  Stress: Not on file  Social Connections: Not on file     Family History: The patient's family history includes Alcohol abuse in an other family member; Arrhythmia in her sister; Arthritis in an other family member; CAD in her sister; Cancer in her mother; Depression in an other family member; Diabetes in an other family member; Heart attack (age of onset: 58) in her sister; Heart disease in an other family member; Hyperlipidemia in an other family member; Hypertension in an other family member; Stroke in an other family member.  ROS:   Please see the history of present illness.    All other systems reviewed and are negative.  EKGs/Labs/Other Studies Reviewed:    The following studies were reviewed today:   TAVR OPERATIVE NOTE   Date of Procedure:                 10/26/2020   Preoperative Diagnosis:      Severe Aortic Stenosis    Postoperative Diagnosis:    Same    Procedure:         Transcatheter Aortic Valve Replacement - Percutaneous Right Transfemoral Approach             Medtronic Evolut-Pro + (size 26 mm, model # EVPROPLUS -26US, serial # [id number])              Co-Surgeons:            Gaye Pollack, MD and Lauree Chandler, MD     Anesthesiologist:                  Gennie Alma, MD   Echocardiographer:              Edmonia James, MD   Pre-operative Echo Findings: Severe aortic stenosis and moderate AI  Normal left ventricular systolic function   Post-operative Echo Findings: No paravalvular leak Normal left ventricular systolic function      _____________    Echo from 11/02/20:    1. Left ventricular ejection fraction, by estimation, is 60 to 65%. The  left ventricle has normal function. The left ventricle has no regional  wall motion abnormalities. There is mild left ventricular hypertrophy.  Left ventricular diastolic parameters are indeterminate.   2. Right ventricular systolic function is normal. The right ventricular  size is normal.   3. Left atrial size was moderately dilated.   4. The mitral valve is normal in structure. No evidence of mitral valve  regurgitation. No evidence of mitral stenosis.  5. The aortic valve has been repaired/replaced. Aortic valve  regurgitation is not visualized. No aortic stenosis is present. There is a  26 mm Sapien prosthetic (TAVR) valve present in the aortic position.  Procedure Date: 10/26/2020. Echo findings are  consistent with normal structure and function of the aortic valve  prosthesis. Aortic valve area, by VTI measures 1.37 cm. Aortic valve mean gradient measures 9.0 mmHg. Aortic valve Vmax measures 2.16 m/s.   6. The inferior vena cava is normal in size with greater than 50%  respiratory variability, suggesting right atrial pressure of 3 mmHg.   _____________      Echo from 10/27/20:    1. Left ventricular ejection fraction, by estimation, is 60 to 65%. The  left ventricle has normal  function. The left ventricle has no regional  wall motion abnormalities. There is mild left ventricular hypertrophy.  Left ventricular diastolic parameters  are indeterminate.   2. Right ventricular systolic function is normal. The right ventricular  size is normal.   3. Left atrial size was severely dilated.   4. Right atrial size was mildly dilated.   5. The mitral valve is degenerative. Mild mitral valve regurgitation.  Severe mitral annular calcification.   6. Post TAVR with 26 mm supra annular Medtronic Evolut Pro valve trivial  PvL mean gradient 12 peak 24 mmHg AVA 1.8 cm2 with DVI 0.63. The aortic  valve has been repaired/replaced. Aortic valve regurgitation is trivial.  Procedure Date: 10/26/2020.      _____________     EKG:  EKG is NOT ordered today.    Recent Labs: 08/11/2020: TSH 3.422 11/01/2020: ALT 21; B Natriuretic Peptide 124.6; NT-Pro BNP 993 11/03/2020: BUN 18; Creatinine, Ser 1.01; Hemoglobin 8.1; Magnesium 2.1; Platelets 219; Potassium 4.6; Sodium 139  Recent Lipid Panel    Component Value Date/Time   CHOL 97 08/11/2020 0840   TRIG 86 08/11/2020 0840   TRIG 90 12/01/2005 1330   HDL 32 (L) 08/11/2020 0840   CHOLHDL 3.0 08/11/2020 0840   VLDL 17 08/11/2020 0840   LDLCALC 48 08/11/2020 0840     Risk Assessment/Calculations:    CHA2DS2-VASc Score = 8   This indicates a 10.8% annual risk of stroke. The patient's score is based upon: CHF History: 0 HTN History: 1 Diabetes History: 1 Stroke History: 2 Vascular Disease History: 1 Age Score: 2 Gender Score: 1      Physical Exam:    VS:  BP (!) 144/60   Pulse (!) 59   Ht $R'5\' 1"'kn$  (1.549 m)   Wt 191 lb 12.8 oz (87 kg)   SpO2 99%   BMI 36.24 kg/m     Wt Readings from Last 3 Encounters:  11/11/20 191 lb 12.8 oz (87 kg)  11/03/20 192 lb 3.9 oz (87.2 kg)  10/28/20 197 lb 5 oz (89.5 kg)     GEN:  Well nourished, well developed in no acute distress, obese HEENT: Normal NECK: No JVD LYMPHATICS: No  lymphadenopathy CARDIAC: RRR, 3/6 flow murmur. No rubs, gallops RESPIRATORY:  Clear to auscultation without rales, wheezing or rhonchi  ABDOMEN: Soft, non-tender, non-distended MUSCULOSKELETAL:  No edema; No deformity  SKIN: Warm and dry.  Groin sites clear without hematoma or ecchymosis  NEUROLOGIC:  Alert and oriented x 3 PSYCHIATRIC:  Normal affect   ASSESSMENT:    1. Shortness of breath   2. S/P TAVR (transcatheter aortic valve replacement)   3. Anemia, unspecified type   4. Paroxysmal atrial fibrillation (HCC)   5.  CHB (complete heart block) (HCC)   6. Essential hypertension   7. Chronic diastolic heart failure ([place])   8. Anxiety    PLAN:    In order of problems listed above:  Severe AS: doing okay from a post surgical standpoint. Repeat echo during readmission was stable. Groin sites are healing well. Still having a lot of dyspnea so will get CBC, BMET, BNP, TSH. May be related to anxiety. SBE prophylaxis discussed; the patient is edentulous and does not go to the dentist. Continue Eliquis    Chronic anemia 2/2 GI bleeding: EDG 9/15 showed gastritis with no signs of active bleeding but stigmata of recent bleeding.  It was suspected that the patient's anemia and melena was due to bleeding from gastric erosions in the setting of Eliquis use. Colonoscopy was deferred given severe AS and risk with sedation.  -- Continue PPI -- Recheck CBC today    PAF: sounds regular on exam today. Continue amio $RemoveBefor'200mg'wxpRRPuIndxm$  daily and Toprol Xl 12.$RemoveBefo'5mg'AqwKRWyngoW$  daily.    Transient complete heart block: wore Zio patch which was back home today. Will review when final report results.    HTN: BP borderline today. No changes made.    Chronic diastolic CHF: appears euvolemic. Continue Lasix $RemoveBefore'40mg'ijuWZmdMnstPH$ / Kdur 72meq daily. Will check BMET today since she was started on Losartan recently and had AKI. Check BNP with orthopnea and dyspnea.    Pulmonary nodule: pre TAVR CT showed a 5 mm pulmonary nodule in the lateral segment  of the right middle lobe. No follow-up needed if patient is low-risk. Non-contrast chest CT can be considered in 12 months if patient is high-risk. This was not discussed today.   Anxiety: encouraged her to talk to PCP about SSRI. Continue PRN xanax .   Medication Adjustments/Labs and Tests Ordered: Current medicines are reviewed at length with the patient today.  Concerns regarding medicines are outlined above.  Orders Placed This Encounter  Procedures   Basic metabolic panel   CBC   TSH   Pro b natriuretic peptide (BNP)    No orders of the defined types were placed in this encounter.   Patient Instructions  Medication Instructions:  Your physician recommends that you continue on your current medications as directed. Please refer to the Current Medication list given to you today.  *If you need a refill on your cardiac medications before your next appointment, please call your pharmacy*   Lab Work: TODAY: BMET, CBC, TSH, BNP If you have labs (blood work) drawn today and your tests are completely normal, you will receive your results only by: [place] (if you have MyChart) OR A paper copy in the mail If you have any lab test that is abnormal or we need to change your treatment, we will call you to review the results.   Testing/Procedures: NONE   Follow-Up: At [facility], you and your health needs are our priority.  As part of our continuing mission to provide you with exceptional heart care, we have created designated Provider Care Teams.  These Care Teams include your primary Cardiologist (physician) and Advanced Practice Providers (APPs -  Physician Assistants and Nurse Practitioners) who all work together to provide you with the care you need, when you need it.  We recommend signing up for the patient portal called "MyChart".  Sign up information is provided on this After Visit Summary.  MyChart is used to connect with patients for Virtual Visits (Telemedicine).   Patients are able to view lab/test results, encounter  notes, upcoming appointments, etc.  Non-urgent messages can be sent to your provider as well.   To learn more about what you can do with MyChart, go to [email or web address].    Your next appointment:   2 week(s)  The format for your next appointment:   In Person  Provider:   Nell Range, PA-C     Signed, Angelena Form, PA-C  11/11/2020 3:02 PM    [place]

## 2020-11-11 ENCOUNTER — Telehealth: Payer: Medicare Other

## 2020-11-11 ENCOUNTER — Encounter: Payer: Self-pay | Admitting: Physician Assistant

## 2020-11-11 ENCOUNTER — Telehealth: Payer: Self-pay | Admitting: *Deleted

## 2020-11-11 ENCOUNTER — Ambulatory Visit: Payer: Medicare Other | Admitting: Physician Assistant

## 2020-11-11 ENCOUNTER — Encounter: Payer: Self-pay | Admitting: *Deleted

## 2020-11-11 ENCOUNTER — Other Ambulatory Visit: Payer: Self-pay

## 2020-11-11 VITALS — BP 144/60 | HR 59 | Ht 61.0 in | Wt 191.8 lb

## 2020-11-11 DIAGNOSIS — Z952 Presence of prosthetic heart valve: Secondary | ICD-10-CM

## 2020-11-11 DIAGNOSIS — D649 Anemia, unspecified: Secondary | ICD-10-CM | POA: Diagnosis not present

## 2020-11-11 DIAGNOSIS — I10 Essential (primary) hypertension: Secondary | ICD-10-CM

## 2020-11-11 DIAGNOSIS — I5032 Chronic diastolic (congestive) heart failure: Secondary | ICD-10-CM | POA: Diagnosis not present

## 2020-11-11 DIAGNOSIS — I442 Atrioventricular block, complete: Secondary | ICD-10-CM

## 2020-11-11 DIAGNOSIS — I480 Paroxysmal atrial fibrillation: Secondary | ICD-10-CM

## 2020-11-11 DIAGNOSIS — R0602 Shortness of breath: Secondary | ICD-10-CM | POA: Diagnosis not present

## 2020-11-11 DIAGNOSIS — F419 Anxiety disorder, unspecified: Secondary | ICD-10-CM

## 2020-11-11 NOTE — Patient Instructions (Signed)
Medication Instructions:  Your physician recommends that you continue on your current medications as directed. Please refer to the Current Medication list given to you today.  *If you need a refill on your cardiac medications before your next appointment, please call your pharmacy*   Lab Work: TODAY: BMET, CBC, TSH, BNP If you have labs (blood work) drawn today and your tests are completely normal, you will receive your results only by: MyChart Message (if you have MyChart) OR A paper copy in the mail If you have any lab test that is abnormal or we need to change your treatment, we will call you to review the results.   Testing/Procedures: NONE   Follow-Up: At [facility], you and your health needs are our priority.  As part of our continuing mission to provide you with exceptional heart care, we have created designated Provider Care Teams.  These Care Teams include your primary Cardiologist (physician) and Advanced Practice Providers (APPs -  Physician Assistants and Nurse Practitioners) who all work together to provide you with the care you need, when you need it.  We recommend signing up for the patient portal called "MyChart".  Sign up information is provided on this After Visit Summary.  MyChart is used to connect with patients for Virtual Visits (Telemedicine).  Patients are able to view lab/test results, encounter notes, upcoming appointments, etc.  Non-urgent messages can be sent to your provider as well.   To learn more about what you can do with MyChart, go to [email or web address].    Your next appointment:   2 week(s)  The format for your next appointment:   In Person  Provider:   Carlean Jews, PA-C

## 2020-11-11 NOTE — Telephone Encounter (Signed)
  Chronic Care Management   Follow Up Note   11/11/2020 Name: Mary Mccarthy MRN: [id number] DOB: 1940-11-27   Referred by: Corwin Levins, MD Reason for referral : Chronic Care Management (CCM RN CM Initial Outreach attempt- Unsuccessful)  An unsuccessful telephone outreach was attempted today. The patient was referred to the case management team for assistance with care management and care coordination.   Follow Up Plan:  A HIPPA compliant phone message was left for the patient providing contact information and requesting a return call-- noted from review of EHR that patient has scheduled cardiology office visit for 2:30 pm today Will place request with scheduling care guide to contact patient to re-schedule today's missed CCM RN initial telephone appointment   Caryl Pina, RN, BSN, CCRN Alumnus CCM Clinic RN Care Coordination- [facility] [place] [phone]: direct office [phone]: mobile

## 2020-11-12 ENCOUNTER — Other Ambulatory Visit: Payer: Self-pay | Admitting: Internal Medicine

## 2020-11-12 LAB — CBC
Hematocrit: 28 % — ABNORMAL LOW (ref 34.0–46.6)
Hemoglobin: 9.3 g/dL — ABNORMAL LOW (ref 11.1–15.9)
MCH: 30.5 pg (ref 26.6–33.0)
MCHC: 33.2 g/dL (ref 31.5–35.7)
MCV: 92 fL (ref 79–97)
Platelets: 296 10*3/uL (ref 150–450)
RBC: 3.05 x10E6/uL — ABNORMAL LOW (ref 3.77–5.28)
RDW: 13.7 % (ref 11.7–15.4)
WBC: 4.6 10*3/uL (ref 3.4–10.8)

## 2020-11-12 LAB — BASIC METABOLIC PANEL
BUN/Creatinine Ratio: 16 (ref 12–28)
BUN: 18 mg/dL (ref 8–27)
CO2: 20 mmol/L (ref 20–29)
Calcium: 10.5 mg/dL — ABNORMAL HIGH (ref 8.7–10.3)
Chloride: 104 mmol/L (ref 96–106)
Creatinine, Ser: 1.13 mg/dL — ABNORMAL HIGH (ref 0.57–1.00)
Glucose: 182 mg/dL — ABNORMAL HIGH (ref 70–99)
Potassium: 4.7 mmol/L (ref 3.5–5.2)
Sodium: 141 mmol/L (ref 134–144)
eGFR: 49 mL/min/{1.73_m2} — ABNORMAL LOW (ref >59)

## 2020-11-12 LAB — PRO B NATRIURETIC PEPTIDE: NT-Pro BNP: 510 pg/mL (ref 0–738)

## 2020-11-12 LAB — TSH: TSH: 5.82 u[IU]/mL — ABNORMAL HIGH (ref 0.450–4.500)

## 2020-11-12 NOTE — Telephone Encounter (Signed)
Rescheduled follow up call with [facility] 11/17/2020  [facility] Guide, Embedded Care Coordination [facility] Management  Direct Dial: [phone]

## 2020-11-16 ENCOUNTER — Encounter: Payer: Self-pay | Admitting: Internal Medicine

## 2020-11-16 ENCOUNTER — Ambulatory Visit (INDEPENDENT_AMBULATORY_CARE_PROVIDER_SITE_OTHER): Payer: Medicare Other | Admitting: Internal Medicine

## 2020-11-16 ENCOUNTER — Other Ambulatory Visit: Payer: Self-pay

## 2020-11-16 VITALS — BP 132/60 | HR 63 | Ht 61.0 in | Wt 193.0 lb

## 2020-11-16 DIAGNOSIS — I10 Essential (primary) hypertension: Secondary | ICD-10-CM

## 2020-11-16 DIAGNOSIS — E1165 Type 2 diabetes mellitus with hyperglycemia: Secondary | ICD-10-CM | POA: Diagnosis not present

## 2020-11-16 DIAGNOSIS — N179 Acute kidney failure, unspecified: Secondary | ICD-10-CM | POA: Diagnosis not present

## 2020-11-16 DIAGNOSIS — F419 Anxiety disorder, unspecified: Secondary | ICD-10-CM

## 2020-11-16 MED ORDER — CITALOPRAM HYDROBROMIDE 10 MG PO TABS: 10.0000 mg | ORAL_TABLET | Freq: Every day | ORAL | 3 refills | Status: DC

## 2020-11-16 NOTE — Progress Notes (Signed)
Patient ID: Mary Mccarthy, female   DOB: 1940/07/10, 80 y.o.   MRN: [id number]        Chief Complaint: follow up low oxygen at night, anxiety, AKI       HPI:  Mary Mccarthy is a 80 y.o. female here with c/o f/u post recent hospn 9/26 - 9/29 with AKI and low volume, anxiety after persistent n/v seemed improved with ativan.   pt is s/p TAVR recently, HLD who was admitted recently for a-fib with RVR and had GI bleed. Underwent TAVR while in hospital and had been discharged on eliquis on 10/28/20. Seen per cardiology, continued on amio, eliquis, toprol.  Did have low o2 at night, and ONO at home arranged for last PM - results pending at this time.  Pt has no new complaints today.  Pt denies chest pain, increased sob or doe, wheezing, orthopnea, PND, increased LE swelling, palpitations, dizziness or syncope.   Pt denies polydipsia, polyuria, or low sugar symptoms after recent amryl stopped and on metformin only.   Pt denies fever, wt loss, night sweats, loss of appetite, or other constitutional symptoms  No other new complaints.  Denies worsening depressive symptoms, suicidal ideation, or panic; has ongoing anxiety.  Does not want to take xanax as is too sedating.         Wt Readings from Last 3 Encounters:  11/16/20 193 lb (87.5 kg)  11/11/20 191 lb 12.8 oz (87 kg)  11/03/20 192 lb 3.9 oz (87.2 kg)   BP Readings from Last 3 Encounters:  11/16/20 132/60  11/11/20 (!) 144/60  11/04/20 (!) 128/39         Past Medical History:  Diagnosis Date   ALLERGIC RHINITIS 06/24/2007   Anemia    AVM (arteriovenous malformation) of colon    Blood transfusion without reported diagnosis 05/2015   COLONIC POLYPS, HX OF 06/24/2007   Coronary artery disease, non-occlusive 05/2015   Trop + w/ Acute Anemia =>CATH: small RI - Ostial 60%, ostial RCA 30% and dLAD 40-50%;; 9/'21: Cor Ca Score 624. Mild (25-49%) prox RCA & LAD,; Moderate (50-69%) Ostial Small RI & prox LCx.     DEPRESSION 10/08/2006   DIABETES MELLITUS,  TYPE II 10/05/2006   GERD 06/24/2007   History of CVA (cerebrovascular accident)    08/2020- found to be in afib with RVR, started on Eliquis   HYPERLIPIDEMIA 10/08/2006   HYPERTENSION 10/08/2006   INSOMNIA 09/24/2008   Left knee DJD    Osteoporosis 03/10/2016   PAF (paroxysmal atrial fibrillation) ([place])    PEPTIC ULCER DISEASE 10/08/2006   S/P TAVR (transcatheter aortic valve replacement) 10/26/2020   s/p TAVR with a 26 mm Medtronic Evolut Pro+ via the TF approach by Dr. Angelena Form & Dr. Cyndia Bent   Severe aortic stenosis    Past Surgical History:  Procedure Laterality Date   APPENDECTOMY     BIOPSY  10/21/2020   Procedure: BIOPSY;  Surgeon: Sharyn Creamer, MD;  Location: [facility] ENDOSCOPY;  Service: Gastroenterology;;   BREAST BIOPSY     CARDIAC CATHETERIZATION N/A 05/21/2015   Procedure: Left Heart Cath and Coronary Angiography;  Surgeon: Leonie Man, MD;  Location: [facility];  Service: Cardiovascular;: Ost RI 60%, Ost RCA 30%, dLAD tapers to small vessel w/ 40-50%. Mildly elevated LVEDP. Normal LV Fxn.   COLONOSCOPY N/A 05/20/2015   Procedure: COLONOSCOPY;  Surgeon: Manus Gunning, MD;  Location: [place];  Service: Gastroenterology;  Laterality: N/A;   CORONARY CA2+ SCORE / CARDIAC  CT ANGIOGRAM  10/09/2019   Calcium score 624.  82nd percentile. Dominant RCA: Mild (25-49%) proximal stenosis-distal bifurcation into PDA and PAV--< RPL branches.  LAD (1 major mid vessel diagonal) diffuse calcified plaque, mild proximal stenosis with minimal distal stenosis.  Small RI moderate ostial disease.  LCx-moderate mixed (50-69%) proximal stenosis.  Small dOM1 disease.  Trileaflet AoV, annular Ca2+ - probable AS   ESOPHAGOGASTRODUODENOSCOPY N/A 05/20/2015   Procedure: ESOPHAGOGASTRODUODENOSCOPY (EGD);  Surgeon: Manus Gunning, MD;  Location: [place];  Service: Gastroenterology;  Laterality: N/A;   ESOPHAGOGASTRODUODENOSCOPY (EGD) WITH PROPOFOL N/A 10/21/2020   Procedure:  ESOPHAGOGASTRODUODENOSCOPY (EGD) WITH PROPOFOL;  Surgeon: Sharyn Creamer, MD;  Location: [place];  Service: Gastroenterology;  Laterality: N/A;   INTRAOPERATIVE TRANSTHORACIC ECHOCARDIOGRAM N/A 10/26/2020   Procedure: INTRAOPERATIVE TRANSTHORACIC ECHOCARDIOGRAM;  Surgeon: Burnell Blanks, MD;  Location: [place];  Service: Open Heart Surgery;  Laterality: N/A;   RIGHT/LEFT HEART CATH AND CORONARY ANGIOGRAPHY N/A 09/29/2020   Procedure: RIGHT/LEFT HEART CATH AND CORONARY ANGIOGRAPHY;  Surgeon: Burnell Blanks, MD;  Location: [facility];  Service: Cardiovascular;  Laterality: N/A;   TONSILLECTOMY     TRANSCATHETER AORTIC VALVE REPLACEMENT, TRANSFEMORAL N/A 10/26/2020   Procedure: TRANSCATHETER AORTIC VALVE REPLACEMENT, TRANSFEMORAL;  Surgeon: Burnell Blanks, MD;  Location: [place];  Service: Open Heart Surgery;  Laterality: N/A;   TRANSTHORACIC ECHOCARDIOGRAM  03/2019; 09/2019   a) EF 60 to 65%.  Moderate LVH.  GRII DD.  Mod-Severe AS (m grad 36 mmHg, peak 59 mmHg); b) EF 65-70%, No RWMA. Gr1 DD/hi LAP, Mild hi PAP. Mod LA Dil. MOD AS (mean Grad 34.5 mmHg).  STABLE    TRANSTHORACIC ECHOCARDIOGRAM  04/12/2017   EF 60-65%. No RWMA.  GR 1 DD.  Moderate concentric LVH.  Mild LA dilation. Severe calcified aortic valve with moderate-severe aortic stenosis (peak/mean gradients 60/34 mmHg) and in severe range by AVA (0.8 cm2).  Mild to moderately increased PA pressures 41 mmHg with normal RV size and function..     TUBAL LIGATION      reports that she has never smoked. She has never used smokeless tobacco. She reports that she does not drink alcohol and does not use drugs. family history includes Alcohol abuse in an other family member; Arrhythmia in her sister; Arthritis in an other family member; CAD in her sister; Cancer in her mother; Depression in an other family member; Diabetes in an other family member; Heart attack (age of onset: 31) in her sister; Heart disease in an other  family member; Hyperlipidemia in an other family member; Hypertension in an other family member; Stroke in an other family member. Allergies  Allergen Reactions   Ibuprofen Other (See Comments)    Bleeding events   Current Outpatient Medications on File Prior to Visit  Medication Sig Dispense Refill   acetaminophen (TYLENOL) 500 MG tablet Take 500 mg by mouth every 6 (six) hours as needed for moderate pain.     ALPRAZolam (XANAX) 0.25 MG tablet TAKE 1 TABLET BY MOUTH 3 TIMES DAILY AS NEEDED FOR ANXIETY. 40 tablet 1   amiodarone (PACERONE) 200 MG tablet Take 1 tablet (200 mg total) by mouth daily. 90 tablet 6   amLODipine (NORVASC) 10 MG tablet Take 1 tablet (10 mg total) by mouth daily. 90 tablet 3   apixaban (ELIQUIS) 5 MG TABS tablet Take 1 tablet (5 mg total) by mouth 2 (two) times daily. 60 tablet 5   atorvastatin (LIPITOR) 40 MG tablet TAKE 1  TABLET BY MOUTH EVERY DAY 90 tablet 3   blood glucose meter kit and supplies Dispense based on patient and insurance preference. Use up to four times daily as directed. E11.9 1 each 0   Blood Glucose Monitoring Suppl (ONE TOUCH ULTRA 2) w/Device KIT Use as directed daily 1 each 0   cyanocobalamin ([employer] VITAMIN B12) 1000 MCG tablet TAKE 1 TABLET BY MOUTH EVERY DAY 90 tablet 3   furosemide (LASIX) 40 MG tablet Take 1 tablet (40 mg total) by mouth daily. 90 tablet 1   Lancets (ONETOUCH DELICA PLUS FOYDXA12I) MISC USE AS DIRECTED TEST 1-2 TIMES A DAY 100 each 23   losartan (COZAAR) 25 MG tablet Take 1 tablet (25 mg total) by mouth at bedtime. 30 tablet 3   metFORMIN (GLUCOPHAGE) 1000 MG tablet TAKE 1 TAB BY MOUTH IN THE AM,AND 1/2 TAB IN THE PM (Patient not taking: Reported on 11/17/2020) 135 tablet 1   metFORMIN (GLUCOPHAGE) 500 MG tablet Take 1 tablet (500 mg total) by mouth 2 (two) times daily with a meal. TAKE 1 TAB BY MOUTH IN THE AM,AND 1/2 TAB IN THE PM 60 tablet 3   metoprolol succinate (TOPROL-XL) 25 MG 24 hr tablet Take 0.5 tablets (12.5 mg  total) by mouth daily. 30 tablet 3   ondansetron (ZOFRAN) 4 MG tablet Take 1 tablet (4 mg total) by mouth every 8 (eight) hours as needed for nausea or vomiting. 20 tablet 0   ONETOUCH ULTRA test strip USE AS INSTRUCTED TEST 1-2 TIMES A DAY 100 strip 23   pantoprazole (PROTONIX) 40 MG tablet Take 1 tablet (40 mg total) by mouth 2 (two) times daily. 120 tablet 0   potassium chloride SA (KLOR-CON) 20 MEQ tablet Take 1 tablet (20 mEq total) by mouth daily. 90 tablet 1   No current facility-administered medications on file prior to visit.        ROS:  All others reviewed and negative.  Objective        PE:  BP 132/60 (BP Location: Right Arm, Patient Position: Sitting, Cuff Size: Large)   Pulse 63   Ht $R'5\' 1"'Ev$  (1.549 m)   Wt 193 lb (87.5 kg)   SpO2 100%   BMI 36.47 kg/m                 Constitutional: Pt appears in NAD               HENT: Head: NCAT.                Right Ear: External ear normal.                 Left Ear: External ear normal.                Eyes: . Pupils are equal, round, and reactive to light. Conjunctivae and EOM are normal               Nose: without d/c or deformity               Neck: Neck supple. Gross normal ROM               Cardiovascular: Normal rate and regular rhythm.                 Pulmonary/Chest: Effort normal and breath sounds without rales or wheezing.                Abd:  Soft, NT, ND, + BS,  no organomegaly               Neurological: Pt is alert. At baseline orientation, motor grossly intact               Skin: Skin is warm. No rashes, no other new lesions, LE edema - none               Psychiatric: Pt behavior is normal without agitation but nervous 2+  Micro: none  Cardiac tracings I have personally interpreted today:  none  Pertinent Radiological findings (summarize): none   Lab Results  Component Value Date   WBC 4.6 11/11/2020   HGB 9.3 (L) 11/11/2020   HCT 28.0 (L) 11/11/2020   PLT 296 11/11/2020   GLUCOSE 182 (H) 11/11/2020   CHOL  97 08/11/2020   TRIG 86 08/11/2020   HDL 32 (L) 08/11/2020   LDLCALC 48 08/11/2020   ALT 21 11/01/2020   AST 32 11/01/2020   NA 141 11/11/2020   K 4.7 11/11/2020   CL 104 11/11/2020   CREATININE 1.13 (H) 11/11/2020   BUN 18 11/11/2020   CO2 20 11/11/2020   TSH 5.820 (H) 11/11/2020   INR 1.7 (H) 11/01/2020   HGBA1C 5.8 (H) 10/26/2020   MICROALBUR 1.8 03/18/2020   Assessment/Plan:  Mary Mccarthy is a 80 y.o. White or Caucasian [1] female with  has a past medical history of ALLERGIC RHINITIS (06/24/2007), Anemia, AVM (arteriovenous malformation) of colon, Blood transfusion without reported diagnosis (05/2015), COLONIC POLYPS, HX OF (06/24/2007), Coronary artery disease, non-occlusive (05/2015), DEPRESSION (10/08/2006), DIABETES MELLITUS, TYPE II (10/05/2006), GERD (06/24/2007), History of CVA (cerebrovascular accident), HYPERLIPIDEMIA (10/08/2006), HYPERTENSION (10/08/2006), INSOMNIA (09/24/2008), Left knee DJD, Osteoporosis (03/10/2016), PAF (paroxysmal atrial fibrillation) ([place]), PEPTIC ULCER DISEASE (10/08/2006), S/P TAVR (transcatheter aortic valve replacement) (10/26/2020), and Severe aortic stenosis.  Essential hypertension BP Readings from Last 3 Encounters:  11/16/20 132/60  11/11/20 (!) 144/60  11/04/20 (!) 128/39   Stable, pt to continue medical treatment norvasc, losartan, toprol   Type 2 diabetes mellitus with hyperglycemia, without long-term current use of insulin ([facility]) Lab Results  Component Value Date   HGBA1C 5.8 (H) 10/26/2020   Stable, pt to continue current medical treatment metformin   Anxiety Mod uncontrolled, to add celexa 10 qd,  to f/u any worsening symptoms or concerns  AKI (acute kidney injury) ([place]) Lab Results  Component Value Date   CREATININE 1.13 (H) 11/11/2020   Stable overall, cont to avoid nephrotoxins  Followup: Return in about 6 months (around 05/17/2021).  Cathlean Cower, MD 11/19/2020 10:33 PM [facility]

## 2020-11-16 NOTE — Patient Instructions (Addendum)
Please take all new medication as prescribed - the celexa 10 mg per day for nerves  Please continue all other medications as before, and refills have been done if requested.  Please have the pharmacy call with any other refills you may need.  Please continue your efforts at being more active, low cholesterol diet, and weight control.  You are otherwise up to date with prevention measures today.  Please keep your appointments with your specialists as you may have planned - Dr Herbie [place]  Please make an Appointment to return in 3 months, or sooner if needed

## 2020-11-17 ENCOUNTER — Ambulatory Visit (INDEPENDENT_AMBULATORY_CARE_PROVIDER_SITE_OTHER): Payer: Medicare Other | Admitting: *Deleted

## 2020-11-17 DIAGNOSIS — I5032 Chronic diastolic (congestive) heart failure: Secondary | ICD-10-CM

## 2020-11-17 DIAGNOSIS — E119 Type 2 diabetes mellitus without complications: Secondary | ICD-10-CM

## 2020-11-17 DIAGNOSIS — I480 Paroxysmal atrial fibrillation: Secondary | ICD-10-CM

## 2020-11-17 NOTE — Chronic Care Management (AMB) (Signed)
Chronic Care Management   CCM RN Visit Note  11/17/2020 Name: Mary Mccarthy MRN: [id number] DOB: 03/16/1940  Subjective: Mary Mccarthy is a 80 y.o. year old female who is a primary care patient of Biagio Borg, MD. The care management team was consulted for assistance with disease management and care coordination needs.    Engaged with patient by telephone for initial visit in response to provider referral for case management and/or care coordination services.   Consent to Services:  The patient was given information about Chronic Care Management services, agreed to services, and gave verbal consent 09/08/20 prior to initiation of services.  Please see initial visit note for detailed documentation.  Patient agreed to services and verbal consent obtained.   Assessment: Review of patient past medical history, allergies, medications, health status, including review of consultants reports, laboratory and other test data, was performed as part of comprehensive evaluation and provision of chronic care management services.   SDOH (Social Determinants of Health) assessments and interventions performed:  SDOH Interventions    Flowsheet Row Most Recent Value  SDOH Interventions   Food Insecurity Interventions Intervention Not Indicated  [reports gets "about $100.00 in food stamps" currently]  Transportation Interventions Intervention Not Indicated  [Family provides transportation]      CCM Care Plan Allergies  Allergen Reactions   Ibuprofen Other (See Comments)    Bleeding events   Outpatient Encounter Medications as of 11/17/2020  Medication Sig Note   metFORMIN (GLUCOPHAGE) 500 MG tablet Take 1 tablet (500 mg total) by mouth 2 (two) times daily with a meal. TAKE 1 TAB BY MOUTH IN THE AM,AND 1/2 TAB IN THE PM    acetaminophen (TYLENOL) 500 MG tablet Take 500 mg by mouth every 6 (six) hours as needed for moderate pain.    ALPRAZolam (XANAX) 0.25 MG tablet TAKE 1 TABLET BY MOUTH 3 TIMES  DAILY AS NEEDED FOR ANXIETY.    amiodarone (PACERONE) 200 MG tablet Take 1 tablet (200 mg total) by mouth daily.    amLODipine (NORVASC) 10 MG tablet Take 1 tablet (10 mg total) by mouth daily.    apixaban (ELIQUIS) 5 MG TABS tablet Take 1 tablet (5 mg total) by mouth 2 (two) times daily.    atorvastatin (LIPITOR) 40 MG tablet TAKE 1 TABLET BY MOUTH EVERY DAY    blood glucose meter kit and supplies Dispense based on patient and insurance preference. Use up to four times daily as directed. E11.9    Blood Glucose Monitoring Suppl (ONE TOUCH ULTRA 2) w/Device KIT Use as directed daily    citalopram (CELEXA) 10 MG tablet Take 1 tablet (10 mg total) by mouth daily.    cyanocobalamin ([employer] VITAMIN B12) 1000 MCG tablet TAKE 1 TABLET BY MOUTH EVERY DAY    furosemide (LASIX) 40 MG tablet Take 1 tablet (40 mg total) by mouth daily.    Lancets (ONETOUCH DELICA PLUS [id number]) MISC USE AS DIRECTED TEST 1-2 TIMES A DAY    losartan (COZAAR) 25 MG tablet Take 1 tablet (25 mg total) by mouth at bedtime.    metFORMIN (GLUCOPHAGE) 1000 MG tablet TAKE 1 TAB BY MOUTH IN THE AM,AND 1/2 TAB IN THE PM (Patient not taking: Reported on 11/17/2020) 11/17/2020: 11/17/20-- Patient reports this was discontinued   metoprolol succinate (TOPROL-XL) 25 MG 24 hr tablet Take 0.5 tablets (12.5 mg total) by mouth daily.    ondansetron (ZOFRAN) 4 MG tablet Take 1 tablet (4 mg total) by mouth every 8 (eight) hours as needed  for nausea or vomiting.    ONETOUCH ULTRA test strip USE AS INSTRUCTED TEST 1-2 TIMES A DAY    pantoprazole (PROTONIX) 40 MG tablet Take 1 tablet (40 mg total) by mouth 2 (two) times daily.    potassium chloride SA (KLOR-CON) 20 MEQ tablet Take 1 tablet (20 mEq total) by mouth daily.    No facility-administered encounter medications on file as of 11/17/2020.   Patient Active Problem List   Diagnosis Date Noted   Dehydration 11/02/2020   Diabetes mellitus type 2 in obese ([place]) 11/02/2020   AKI (acute kidney  injury) ([place]) 11/01/2020   CHB (complete heart block) ([place]) 10/28/2020   S/P TAVR (transcatheter aortic valve replacement) 10/26/2020   Melena 10/20/2020   Symptomatic anemia 10/19/2020   Acute bronchitis 09/21/2020   Paroxysmal atrial fibrillation ([place]) 08/23/2020   Secondary hypercoagulable state ([employer]) 08/23/2020   Atrial fibrillation with RVR ([place]) 08/11/2020   History of CVA (cerebrovascular accident) 08/10/2020   Hypomagnesemia 08/10/2020   Aortic atherosclerosis ([place]) 03/18/2020   B12 deficiency 09/21/2019   Cough 03/14/2018   Wheezing 03/14/2018   Anxiety 09/11/2017   Severe aortic stenosis 03/14/2017   Osteoporosis 03/10/2016   Coronary artery disease, non-occlusive 05/24/2015   GI bleed    Pulmonary hypertension ([facility])    Chronic diastolic heart failure ([place]) 05/20/2015   Gastric ulceration    AVM (arteriovenous malformation) of colon    Demand ischemia of myocardium ([place])    Right knee pain 05/21/2014   Anemia, iron deficiency 05/20/2013   MENOPAUSAL DISORDER 12/07/2009   INSOMNIA 09/24/2008   ALLERGIC RHINITIS 06/24/2007   GERD 06/24/2007   COLONIC POLYPS, HX OF 06/24/2007   Hyperlipidemia with target LDL less than 70 10/08/2006   Depression 10/08/2006   Essential hypertension 10/08/2006   PEPTIC ULCER DISEASE 10/08/2006   Type 2 diabetes mellitus with hyperglycemia, without long-term current use of insulin ([place]) 10/05/2006   Morbid obesity ([place]) 10/05/2006   Conditions to be addressed/monitored:  CHF and DMII  Care Plan : RN Care Manager Plan of Care  Updates made by Knox Royalty, RN since 11/17/2020 12:00 AM     Problem: Chronic Disease Management Needs   Priority: High     Long-Range Goal: Development of plan of care for long term chronic disease management   Start Date: 11/17/2020  Expected End Date: 11/17/2021  Priority: High  Note:   Current Barriers:  Chronic Disease Management support and education needs related to CHF and DMII Lives with  son- who works during daytime hours: alone during day, limited stamina to perform ADL's/ iADL's after multiple recent hospitalizations Confusion around multiple new medications, post- recent hospitalizations  Multiple recent inpatient hospitalizations:  August 22-26, 2022: CHF September 12-22, 2022: AF with RVR; CHF; TAVR September 26-29, 2022: N/V, AKI  RNCM Clinical Goal(s):  Patient will verbalize basic understanding of CHF and DMII disease process and self health management plan CHF; A-Fib; DMII  through collaboration with RN Care manager, provider, and care team.   Interventions: 1:1 collaboration with primary care provider regarding development and update of comprehensive plan of care as evidenced by provider attestation and co-signature Inter-disciplinary care team collaboration (see longitudinal plan of care) Evaluation of current treatment plan related to  self management and patient's adherence to plan as established by provider  Heart Failure Interventions:  (Status: New goal.) Basic overview and discussion of pathophysiology of Heart Failure reviewed; Provided education on low sodium diet; Reviewed Heart Failure Action Plan in depth and  provided written copy; Assessed need for readable accurate scales in home; Provided education about placing scale on hard, flat surface; Advised patient to weigh each morning after emptying bladder; Discussed importance of daily weight and advised patient to weigh and record daily; Reviewed role of diuretics in prevention of fluid overload and management of heart failure; Discussed the importance of keeping all appointments with provider; Assessed social determinant of health barriers;  Reviewed recent hospitalizations; patient very frustrated about being sick and in hospital so much over last few weeks: emotional support/ encouragement provided Reviewed recent weights at home: reports consistently between 190-192, with weight over last 2 days  reported as "191.8 lbs" Discussed fluid restrictions at home: after most recent hospitalization for AKI, reports she was told to "drink more water;" we discussed need to balance fluid intake vs. avoid dehydration, in setting of diuretic therapy: encouraged her to discuss specific fluid restrictions with cardiologist at upcoming provider appointment, and to continue monitoring daily weights/ breathing status at home, and to follow action plan for yellow CHF zone Confirmed patient tries to follow heart healthy, low salt diet: this is challenging; her son works during day, she is alone during day time hours and has decreased stamina to prepare meals- planning to apply for Meals on Wheels: she denies referral to [employer] today- states her niece is going to help her apply, she would like to wait for niece to assist- we will re-visit during future outreaches Discussed medication management with patient: she reports son prepares medications in weekly pill box, then she takes independently; she has questions about the timing of taking her medications: states she is taking more medicine than she ever has, and she wonders if she should be spacing out the medications, or take them all at once. Discussed addition of celexa to her medications yesterday at PCP office visit: reports that after she took first dose this morning, she has been nauseated ever since taking- wonders if this is a side effect; discussed that it possibly could be related; we developed plan for her to take second dose tomorrow as prescribed- if she experiences ongoing nausea after taking, I advised her to stop taking,  notify Dr. Jenny Reichmann of nausea, and wait for his recommendation Currently reports no difficulty affording medications- made patient aware that there is PAP available for Eliquis [facility] place CCM Pharmacy referral for general medication management optimization, given patient's multiple questions about newly prescribed  medications Discussed patient's ongoing restlessness/ sleeplessness at night: she reports ongoing difficulty "getting air" at night, post- recent hospitalizations; states that Dr. Jenny Reichmann ordered "monitor device" for her to wear to determine if she needs to have home O2: states [employer] just recently picked up this device; she is anxious/ hopeful for prompt results- currently "not sleeping well at all; have to use 2-3 pillows and have a fan blowing in my face all the time" Reviewed upcoming provider office visit with patient: 11/25/20- ECHO, cardiology provider; 11/29/20- cardiology:  encouraged patient to make dedicated list of her specific questions to discuss with cardiology provider at time of upcoming scheduled provider office visits  Diabetes:  (Status: New goal.) Lab Results  Component Value Date   HGBA1C 5.8 (H) 10/26/2020  Assessed patient's understanding of A1c goal: <7% Provided education to patient about basic DM disease process; Discussed plans with patient for ongoing care management follow up and provided patient with direct contact information for care management team;      Review of patient status, including review of  consultants reports, relevant laboratory and other test results, and medications completed;       Confirmed patient monitors/ records blood sugars at home BID: fasting and post-prandial Reviewed with patient recent blood sugars at home: reports consistent fasting blood sugar values between 120-130; post-prandial blood sugar values between "130-140" Reviewed with patient current dosing of metformin; updated medication list in EHR accordingly: she is currently taking 500 mg q am and 250 mg q pm  Patient Goals/Self-Care Activities: Patient will self administer medications as prescribed as evidenced by self report  Patient will attend all scheduled provider appointments as evidenced by clinician review of documented attendance to scheduled appointments and patient  report Patient will call pharmacy for medication refills as evidenced by patient report and review of pharmacy fill history as appropriate Patient will call provider office for new concerns or questions as evidenced by review of documented incoming telephone call notes and patient report Patient will continue to follow heart healthy, low salt, carbohydrate modified, low sugar diet, as evidenced by patient reporting during CCM RN CM follow up outreach Patient will continue to monitor and record fasting and afternoon blood sugars at home daily, as evidenced by review of same/ patient reporting during CCM RN CM follow up outreach Patient will discuss her individual fluid restriction guidelines with her cardiologist, as evidenced by patient reporting during CCM RN CM outreach Patient will make a list of questions to talk to her cardiologist about, as evidenced by patient reporting during CCM RN CM outrteach Patient will continue to monitor and record daily weights at home, as evidenced by review of same/ patient reporting during CCM RN CM follow up outreach Patient will engage with CCM Pharmacy team to have her medications reviewed and questions answered, as evidenced by successful engagement of CCM Pharmacy team      Plan: Telephone follow up appointment with care management team member scheduled for:  Monday, December 06, 2020 at 11:30 am The patient has been provided with contact information for the care management team and has been advised to call with any health related questions or concerns  [name], RN, BSN, [facility] RN Care Coordination- [place] [phone]: direct office [phone]: mobile

## 2020-11-17 NOTE — Patient Instructions (Signed)
Visit Information   Mary Mccarthy, it was nice talking with you today.   I have asked the Pharmacy team at Dr. Gwynn Burly office to contact you to discuss your questions about your medications- please listen out for a call from the pharmacy team, and have your medication list and your questions ready when they call you.    I look forward to talking to you again for an update on Monday, December 06, 2020 at 11:30 am- please be listening out for my call that day.  I will call as close to 11:30 am as possible.   If you need to cancel or re-schedule our telephone visit, please call [phone] and one of our care guides will be happy to assist you.   I look forward to hearing about your progress.   Please don't hesitate to contact me if I can be of assistance to you before our next scheduled telephone appointment.   Mary Rack, RN, BSN, [facility] RN Care Coordination- [place] [phone]: direct office [phone]: mobile   PATIENT GOALS:   Goals Addressed             This Visit's Progress    Patient Self-Care Activities   On track    Timeframe:  Long-Range Goal Priority:  High Start Date:       11/17/20                      Expected End Date:   11/17/21                    Patient will self administer medications as prescribed   Patient will attend all scheduled provider appointments  Patient will call pharmacy for medication refills  Patient will call provider office for new concerns or questions  Patient will continue to follow heart healthy, low salt, carbohydrate modified, low sugar diet Patient will continue to monitor and record fasting and afternoon blood sugars at home daily Patient will discuss her individual fluid restriction guidelines with her cardiologist Patient will make a list of questions to talk with her cardiologist about Patient will continue to monitor and record daily weights at home  Patient will engage with [facility]  team to have her medications reviewed and questions answered       Heart Failure Action Plan A heart failure action plan helps you understand what to do when you have symptoms of heart failure. Your action plan is a color-coded plan that lists the symptoms to watch for and indicates what actions to take. If you have symptoms in the red zone, you need medical care right away. If you have symptoms in the yellow zone, you are having problems. If you have symptoms in the green zone, you are doing well. Follow the plan that was created by you and your health care provider. Review your plan each time you visit your health care provider. Red zone These signs and symptoms mean you should get medical help right away: You have trouble breathing when resting. You have a dry cough that is getting worse. You have swelling or pain in your legs or abdomen that is getting worse. You suddenly gain more than 2-3 lb (0.9-1.4 kg) in 24 hours, or more than 5 lb (2.3 kg) in a week. This amount may be more or less depending on your condition. You have trouble staying awake or you feel confused. You have chest pain. You do not have  an appetite. You pass out. You have worsening sadness or depression. If you have any of these symptoms, call your local emergency services (911 in the [place] right away. Do not drive yourself to the hospital. Yellow zone These signs and symptoms mean your condition may be getting worse and you should make some changes: You have trouble breathing when you are active, or you need to sleep with your head raised on extra pillows to help you breathe. You have swelling in your legs or abdomen. You gain 2-3 lb (0.9-1.4 kg) in 24 hours, or 5 lb (2.3 kg) in a week. This amount may be more or less depending on your condition. You get tired easily. You have trouble sleeping. You have a dry cough. If you have any of these symptoms: Contact your health care provider within the next day. Your  health care provider may adjust your medicines. Green zone These signs mean you are doing well and can continue what you are doing: You do not have shortness of breath. You have very little swelling or no new swelling. Your weight is stable (no gain or loss). You have a normal activity level. You do not have chest pain or any other new symptoms. Follow these instructions at home: Take over-the-counter and prescription medicines only as told by your health care provider. Weigh yourself daily. Your target weight is __________ lb (__________ kg). Call your health care provider if you gain more than __________ lb (__________ kg) in 24 hours, or more than __________ lb (__________ kg) in a week. Health care provider name: _____________________________________________________ Health care provider phone number: _____________________________________________________ Eat a heart-healthy diet. Work with a diet and nutrition specialist (dietitian) to create an eating plan that is best for you. Keep all follow-up visits. This is important. Where to find more information American Heart Association: www.heart.org Summary A heart failure action plan helps you understand what to do when you have symptoms of heart failure. Follow the action plan that was created by you and your health care provider. Get help right away if you have any symptoms in the red zone. This information is not intended to replace advice given to you by your health care provider. Make sure you discuss any questions you have with your health care provider. Document Revised: 09/08/2019 Document Reviewed: 09/08/2019 Elsevier Patient Education  2022 [place].   Consent to CCM Services: Ms. Heiler was given information about Chronic Care Management services including:  CCM service includes personalized support from designated clinical staff supervised by her physician, including individualized plan of care and coordination with other  care providers 24/7 contact phone numbers for assistance for urgent and routine care needs. Service will only be billed when office clinical staff spend 20 minutes or more in a month to coordinate care. Only one practitioner may furnish and bill the service in a calendar month. The patient may stop CCM services at any time (effective at the end of the month) by phone call to the office staff. The patient will be responsible for cost sharing (co-pay) of up to 20% of the service fee (after annual deductible is met).  Patient agreed to services and verbal consent obtained.   The patient verbalized understanding of instructions, educational materials, and care plan provided today and agreed to receive a mailed copy of patient instructions, educational materials, and care plan Telephone follow up appointment with care management team member scheduled for:  Monday, December 06, 2020 at 11:30 am The patient has been provided with  contact information for the care management team and has been advised to call with any health related questions or concerns  Mary Rack, RN, BSN, [facility] [phone]: direct office [phone]: mobile   CLINICAL CARE PLAN: Patient Care Plan: RN Care Manager Plan of Care     Problem Identified: Chronic Disease Management Needs   Priority: High     Long-Range Goal: Development of plan of care for long term chronic disease management   Start Date: 11/17/2020  Expected End Date: 11/17/2021  Priority: High  Note:   Current Barriers:  Chronic Disease Management support and education needs related to CHF and DMII Lives with son- who works during daytime hours: alone during day, limited stamina to perform ADL's/ iADL's after multiple recent hospitalizations Confusion around multiple new medications, post- recent hospitalizations  Multiple recent inpatient hospitalizations:  August 22-26, 2022:  CHF September 12-22, 2022: AF with RVR; CHF; TAVR September 26-29, 2022: N/V, AKI  RNCM Clinical Goal(s):  Patient will verbalize basic understanding of CHF and DMII disease process and self health management plan CHF; A-Fib; DMII  through collaboration with RN Care manager, provider, and care team.   Interventions: 1:1 collaboration with primary care provider regarding development and update of comprehensive plan of care as evidenced by provider attestation and co-signature Inter-disciplinary care team collaboration (see longitudinal plan of care) Evaluation of current treatment plan related to  self management and patient's adherence to plan as established by provider  Heart Failure Interventions:  (Status: New goal.) Basic overview and discussion of pathophysiology of Heart Failure reviewed; Provided education on low sodium diet; Reviewed Heart Failure Action Plan in depth and provided written copy; Assessed need for readable accurate scales in home; Provided education about placing scale on hard, flat surface; Advised patient to weigh each morning after emptying bladder; Discussed importance of daily weight and advised patient to weigh and record daily; Reviewed role of diuretics in prevention of fluid overload and management of heart failure; Discussed the importance of keeping all appointments with provider; Assessed social determinant of health barriers;  Reviewed recent hospitalizations; patient very frustrated about being sick and in hospital so much over last few weeks: emotional support/ encouragement provided Reviewed recent weights at home: reports consistently between 190-192, with weight over last 2 days reported as "191.8 lbs" Discussed fluid restrictions at home: after most recent hospitalization for AKI, reports she was told to "drink more water;" we discussed need to balance fluid intake vs. avoid dehydration, in setting of diuretic therapy: encouraged her to discuss  specific fluid restrictions with cardiologist at upcoming provider appointment, and to continue monitoring daily weights/ breathing status at home, and to follow action plan for yellow CHF zone Confirmed patient tries to follow heart healthy, low salt diet: this is challenging; her son works during day, she is alone during day time hours and has decreased stamina to prepare meals- planning to apply for Meals on Wheels: she denies referral to [employer] today- states her niece is going to help her apply, she would like to wait for niece to assist- we will re-visit during future outreaches Discussed medication management with patient: she reports son prepares medications in weekly pill box, then she takes independently; she has questions about the timing of taking her medications: states she is taking more medicine than she ever has, and she wonders if she should be spacing out the medications, or take them all at once.  Discussed addition of celexa to her medications yesterday at PCP office visit: reports that after she took first dose this morning, she has been nauseated ever since taking- wonders if this is a side effect; discussed that it possibly could be related; we developed plan for her to take second dose tomorrow as prescribed- if she experiences ongoing nausea after taking, I advised her to stop taking,  notify Dr. Jenny Reichmann of nausea, and wait for his recommendation Currently reports no difficulty affording medications- made patient aware that there is PAP available for Eliquis Will place CCM Pharmacy referral for general medication management optimization, given patient's multiple questions about newly prescribed medications Discussed patient's ongoing restlessness/ sleeplessness at night: she reports ongoing difficulty "getting air" at night, post- recent hospitalizations; states that Dr. Jenny Reichmann ordered "monitor device" for her to wear to determine if she needs to have home O2: states  [employer] just recently picked up this device; she is anxious/ hopeful for prompt results- currently "not sleeping well at all; have to use 2-3 pillows and have a fan blowing in my face all the time" Reviewed upcoming provider office visit with patient: 11/25/20- ECHO, cardiology provider; 11/29/20- cardiology:  encouraged patient to make dedicated list of her specific questions to discuss with cardiology provider at time of upcoming scheduled provider office visits  Diabetes:  (Status: New goal.) Lab Results  Component Value Date   HGBA1C 5.8 (H) 10/26/2020  Assessed patient's understanding of A1c goal: <7% Provided education to patient about basic DM disease process; Discussed plans with patient for ongoing care management follow up and provided patient with direct contact information for care management team;      Review of patient status, including review of consultants reports, relevant laboratory and other test results, and medications completed;       Confirmed patient monitors/ records blood sugars at home BID: fasting and post-prandial Reviewed with patient recent blood sugars at home: reports consistent fasting blood sugar values between 120-130; post-prandial blood sugar values between "130-140" Reviewed with patient current dosing of metformin; updated medication list in EHR accordingly: she is currently taking 500 mg q am and 250 mg q pm  Patient Goals/Self-Care Activities: Patient will self administer medications as prescribed as evidenced by self report  Patient will attend all scheduled provider appointments as evidenced by clinician review of documented attendance to scheduled appointments and patient report Patient will call pharmacy for medication refills as evidenced by patient report and review of pharmacy fill history as appropriate Patient will call provider office for new concerns or questions as evidenced by review of documented incoming telephone call notes and patient  report Patient will continue to follow heart healthy, low salt, carbohydrate modified, low sugar diet, as evidenced by patient reporting during CCM RN CM follow up outreach Patient will continue to monitor and record fasting and afternoon blood sugars at home daily, as evidenced by review of same/ patient reporting during CCM RN CM follow up outreach Patient will discuss her individual fluid restriction guidelines with her cardiologist, as evidenced by patient reporting during CCM RN CM outreach Patient will make a list of questions to talk to her cardiologist about, as evidenced by patient reporting during CCM RN CM outrteach Patient will continue to monitor and record daily weights at home, as evidenced by review of same/ patient reporting during CCM RN CM follow up outreach Patient will engage with CCM Pharmacy team to have her medications reviewed and questions answered, as evidenced by successful engagement of CCM  Pharmacy team

## 2020-11-18 ENCOUNTER — Telehealth: Payer: Self-pay | Admitting: Internal Medicine

## 2020-11-18 DIAGNOSIS — R0902 Hypoxemia: Secondary | ICD-10-CM | POA: Diagnosis not present

## 2020-11-18 NOTE — Telephone Encounter (Signed)
Complaining of continuous nausea after medication change.   Transferred to triage.   Team Health Consult   Caller states she started on Citalopram yesterday. State all day yesterday and today has been very nauseated and unable to eat anything. Was told to call office back if nausea was still happening today. Has not vomited.  Advised to call PCP within 24hrs. Caller was transferred to back-line and was scheduled for virtual visit due to lack of transportation on 11/22/2020

## 2020-11-19 ENCOUNTER — Other Ambulatory Visit: Payer: Self-pay | Admitting: Internal Medicine

## 2020-11-19 ENCOUNTER — Telehealth: Payer: Self-pay | Admitting: Lab

## 2020-11-19 ENCOUNTER — Encounter: Payer: Self-pay | Admitting: Internal Medicine

## 2020-11-19 DIAGNOSIS — J9611 Chronic respiratory failure with hypoxia: Secondary | ICD-10-CM

## 2020-11-19 NOTE — Assessment & Plan Note (Signed)
Lab Results  Component Value Date   HGBA1C 5.8 (H) 10/26/2020   Stable, pt to continue current medical treatment metformin

## 2020-11-19 NOTE — Telephone Encounter (Signed)
Patient notified

## 2020-11-19 NOTE — Chronic Care Management (AMB) (Signed)
  Chronic Care Management   Note  11/19/2020 Name: Alisea Matte MRN: [id number] DOB: 30-Apr-1940  Jaanai Salemi is a 80 y.o. year old female who is a primary care patient of Jonny Ruiz, Len Blalock, MD. I reached out to [name] by phone today in response to a referral sent by Ms. Lum Keas PCP, Corwin Levins, MD.   Ms. Swearingin was given information about Chronic Care Management services today including:  CCM service includes personalized support from designated clinical staff supervised by her physician, including individualized plan of care and coordination with other care providers 24/7 contact phone numbers for assistance for urgent and routine care needs. Service will only be billed when office clinical staff spend 20 minutes or more in a month to coordinate care. Only one practitioner may furnish and bill the service in a calendar month. The patient may stop CCM services at any time (effective at the end of the month) by phone call to the office staff.   Patient agreed to services and verbal consent obtained.   Follow up plan:  Carilyn Goodpasture  [employer] Scheduler  SIGN[employer]

## 2020-11-19 NOTE — Assessment & Plan Note (Signed)
Mod uncontrolled, to add celexa 10 qd,  to f/u any worsening symptoms or concerns

## 2020-11-19 NOTE — Assessment & Plan Note (Signed)
Lab Results  Component Value Date   CREATININE 1.13 (H) 11/11/2020   Stable overall, cont to avoid nephrotoxins

## 2020-11-19 NOTE — Assessment & Plan Note (Signed)
BP Readings from Last 3 Encounters:  11/16/20 132/60  11/11/20 (!) 144/60  11/04/20 (!) 128/39   Stable, pt to continue medical treatment norvasc, losartan, toprol

## 2020-11-19 NOTE — Telephone Encounter (Signed)
Ok to try take half for 1 wk, then 1 per day after that again

## 2020-11-22 ENCOUNTER — Telehealth (INDEPENDENT_AMBULATORY_CARE_PROVIDER_SITE_OTHER): Payer: Medicare Other | Admitting: Internal Medicine

## 2020-11-22 ENCOUNTER — Encounter: Payer: Self-pay | Admitting: Internal Medicine

## 2020-11-22 ENCOUNTER — Other Ambulatory Visit: Payer: Self-pay

## 2020-11-22 ENCOUNTER — Ambulatory Visit: Payer: Medicare Other | Admitting: Pharmacist

## 2020-11-22 ENCOUNTER — Telehealth: Payer: Self-pay | Admitting: *Deleted

## 2020-11-22 DIAGNOSIS — F419 Anxiety disorder, unspecified: Secondary | ICD-10-CM

## 2020-11-22 DIAGNOSIS — I10 Essential (primary) hypertension: Secondary | ICD-10-CM

## 2020-11-22 DIAGNOSIS — I480 Paroxysmal atrial fibrillation: Secondary | ICD-10-CM

## 2020-11-22 DIAGNOSIS — J9611 Chronic respiratory failure with hypoxia: Secondary | ICD-10-CM | POA: Diagnosis not present

## 2020-11-22 DIAGNOSIS — E785 Hyperlipidemia, unspecified: Secondary | ICD-10-CM

## 2020-11-22 DIAGNOSIS — J9610 Chronic respiratory failure, unspecified whether with hypoxia or hypercapnia: Secondary | ICD-10-CM | POA: Insufficient documentation

## 2020-11-22 DIAGNOSIS — F32A Depression, unspecified: Secondary | ICD-10-CM

## 2020-11-22 DIAGNOSIS — E119 Type 2 diabetes mellitus without complications: Secondary | ICD-10-CM

## 2020-11-22 DIAGNOSIS — F5101 Primary insomnia: Secondary | ICD-10-CM

## 2020-11-22 DIAGNOSIS — I700 Atherosclerosis of aorta: Secondary | ICD-10-CM

## 2020-11-22 DIAGNOSIS — I5032 Chronic diastolic (congestive) heart failure: Secondary | ICD-10-CM

## 2020-11-22 MED ORDER — MIRTAZAPINE 15 MG PO TABS
15.0000 mg | ORAL_TABLET | Freq: Every day | ORAL | 3 refills | Status: DC
Start: 2020-11-22 — End: 2020-11-29

## 2020-11-22 NOTE — Progress Notes (Signed)
Chronic Care Management Pharmacy Note  11/22/2020 Name:  Mary Mccarthy MRN:  [id number] DOB:  1940-05-28  Summary: -Pt stopped citalopram after a few days due to nausea and "shakes" -Pt is not taking alprazolam, she says it made her too hyper. She reports her twin sister used to take Valium and it was helpful for her. -Pt is interested in therapy, she does not want to see a psychiatrist though.  Recommendations/Changes made from today's visit: -Recommend mirtazapine 7.5 mg daily HS -Recommend diazepam 5 mg PRN for severe anxiety/panic only.  (Pt has appt with PCP today to discuss) -Refer to CCM LCSW for mental health support   Subjective: Mary Mccarthy is an 80 y.o. year old female who is a primary patient of Jenny Reichmann, Hunt Oris, MD.  The CCM team was consulted for assistance with disease management and care coordination needs.    Engaged with patient by telephone for initial visit in response to provider referral for pharmacy case management and/or care coordination services.   Consent to Services:  The patient was given the following information about Chronic Care Management services today, agreed to services, and gave verbal consent: 1. CCM service includes personalized support from designated clinical staff supervised by the primary care provider, including individualized plan of care and coordination with other care providers 2. 24/7 contact phone numbers for assistance for urgent and routine care needs. 3. Service will only be billed when office clinical staff spend 20 minutes or more in a month to coordinate care. 4. Only one practitioner may furnish and bill the service in a calendar month. 5.The patient may stop CCM services at any time (effective at the end of the month) by phone call to the office staff. 6. The patient will be responsible for cost sharing (co-pay) of up to 20% of the service fee (after annual deductible is met). Patient agreed to services and consent  obtained.  Patient Care Team: Biagio Borg, MD as PCP - General (Internal Medicine) Leonie Man, MD as PCP - Cardiology (Cardiology) Knox Royalty, RN as Case Manager Charlton Haws, [facility] as Pharmacist (Pharmacist)  Recent office visits: 11/16/20 Dr Jenny Reichmann OV: [facility] f/u - added citalopram 10 mg for anxiety (pt did not want to take xanax, too sedating)  10/13/20 Dr Jenny Reichmann OV: f/u anxiety, dHF. Improved on xanax, lasix. BP elevated , pt declines further changes.  10/06/20 Dr Jenny Reichmann OV: c/o SOB, anxiety. Rx'd furosemide 20 mg and alprazolam. F/U  1 week  Recent consult visits: 11/11/20 PA [place] Silos (cardiology): ordered labs due to dyspnea - mostly normal/stable; SOB likely related to anxiety, advised f/u with PCP  Hospital visits: Medication Reconciliation was completed by comparing discharge summary, patient's EMR and Pharmacy list, and upon discussion with patient.  Admitted to the hospital on 11/01/20 due to AKI. Discharge date was 11/04/20. Discharged from [facility].   -Readmission after TAVR 9/22 for persistent n/v, AKI. Improved w/ IVF.  New?Medications Started at [facility] Discharge:?? -started metoprolol 12.5 mg, losartan 25 mg  Medication Changes at Hospital Discharge: -Changed metformin to 500 mg BID  Medications Discontinued at Hospital Discharge: -Stopped glimepiride, hydralazine  Medications that remain the same after Hospital Discharge:??  -All other medications will remain the same.     Admitted to the hospital on 10/18/20 due to GI bleed, TAVR. Discharge date was 10/28/20. Discharged from [place]?Medications Started at [facility] Discharge:?? -started pantoprazole 40 mg BID x 8 weeks, then daily due to  GI bleed -amlodipine 10 mg, hydralazine 100 mg TID, potassium 10 meq  Medication Changes at Hospital Discharge: -Changed furosemide to 40 mg daily  Medications Discontinued at Hospital Discharge: -Stopped carvedilol (due to heart  block) and aspirin (due to GI Bleed)  Medications that remain the same after Hospital Discharge:??  -All other medications will remain the same.    Medication Reconciliation was completed by comparing discharge summary, patient's EMR and Pharmacy list, and upon discussion with patient.  Admitted to the hospital on 09/27/20 due to acute HF. Discharge date was 10/01/20. Discharged from [facility].   -improved with IV lasix -R and LHC performed, nonobstructive disease -Afib w/ RVR improved with IV amio  New?Medications Started at [facility] Discharge:?? -started amiodarone due to Afib, aspirin due to nonobstructive CAD  Medications that remain the same after Hospital Discharge:??  -All other medications will remain the same.    Objective:  Lab Results  Component Value Date   CREATININE 1.13 (H) 11/11/2020   BUN 18 11/11/2020   GFR 43.16 (L) 10/13/2020   GFRNONAA 56 (L) 11/03/2020   GFRAA 57 (L) 10/01/2019   NA 141 11/11/2020   K 4.7 11/11/2020   CALCIUM 10.5 (H) 11/11/2020   CO2 20 11/11/2020   GLUCOSE 182 (H) 11/11/2020    Lab Results  Component Value Date/Time   HGBA1C 5.8 (H) 10/26/2020 03:42 AM   HGBA1C 7.4 (H) 08/10/2020 04:18 PM   GFR 43.16 (L) 10/13/2020 02:18 PM   GFR 58.19 (L) 10/06/2020 11:10 AM   MICROALBUR 1.8 03/18/2020 11:25 AM   MICROALBUR 5.7 (H) 09/11/2019 09:44 AM    Last diabetic Eye exam:  Lab Results  Component Value Date/Time   HMDIABEYEEXA No Retinopathy 07/16/2018 12:00 AM    Last diabetic Foot exam: No results found for: HMDIABFOOTEX   Lab Results  Component Value Date   CHOL 97 08/11/2020   HDL 32 (L) 08/11/2020   LDLCALC 48 08/11/2020   TRIG 86 08/11/2020   CHOLHDL 3.0 08/11/2020    Hepatic Function Latest Ref Rng & Units 11/01/2020 11/01/2020 10/06/2020  Total Protein 6.5 - 8.1 g/dL 7.1 6.7 6.7  Albumin 3.5 - 5.0 g/dL 3.6 4.0 3.7  AST 15 - 41 U/L 32 27 18  ALT 0 - 44 U/L 21 15 11   Alk Phosphatase 38 - 126 U/L 49 61 38(L)   Total Bilirubin 0.3 - 1.2 mg/dL 1.0 0.7 1.0  Bilirubin, Direct 0.0 - 0.3 mg/dL - - 0.2    Lab Results  Component Value Date/Time   TSH 5.820 (H) 11/11/2020 02:57 PM   TSH 3.422 08/11/2020 03:12 AM   TSH 4.92 (H) 03/18/2020 11:25 AM    CBC Latest Ref Rng & Units 11/11/2020 11/03/2020 11/02/2020  WBC 3.4 - 10.8 x10E3/uL 4.6 6.7 4.7  Hemoglobin 11.1 - 15.9 g/dL 9.3(L) 8.1(L) 8.0(L)  Hematocrit 34.0 - 46.6 % 28.0(L) 26.0(L) 24.6(L)  Platelets 150 - 450 x10E3/uL 296 219 201    Lab Results  Component Value Date/Time   VD25OH 14.34 (L) 03/18/2020 11:25 AM   VD25OH 23.26 (L) 09/11/2019 09:44 AM    Clinical ASCVD: Yes  The ASCVD Risk score (Arnett DK, et al., 2019) failed to calculate for the following reasons:   The 2019 ASCVD risk score is only valid for ages 33 to 19    Depression screen PHQ 2/9 03/18/2020 09/16/2019 03/19/2019  Decreased Interest 0 0 0  Down, Depressed, Hopeless 1 1 1   PHQ - 2 Score 1 1 1  Altered sleeping - 0 -  Tired, decreased energy - 0 -  Change in appetite - 0 -  Feeling bad or failure about yourself  - 0 -  Trouble concentrating - 0 -  Moving slowly or fidgety/restless - 0 -  Suicidal thoughts - 0 -  PHQ-9 Score - 1 -  Difficult doing work/chores - - -  Some recent data might be hidden     CHA2DS2-VASc Score = 8  The patient's score is based upon: CHF History: 0 HTN History: 1 Diabetes History: 1 Stroke History: 2 Vascular Disease History: 1 Age Score: 2 Gender Score: 1       Social History   Tobacco Use  Smoking Status Never  Smokeless Tobacco Never   BP Readings from Last 3 Encounters:  11/16/20 132/60  11/11/20 (!) 144/60  11/04/20 (!) 128/39   Pulse Readings from Last 3 Encounters:  11/16/20 63  11/11/20 (!) 59  11/04/20 67   Wt Readings from Last 3 Encounters:  11/16/20 193 lb (87.5 kg)  11/11/20 191 lb 12.8 oz (87 kg)  11/03/20 192 lb 3.9 oz (87.2 kg)   BMI Readings from Last 3 Encounters:  11/16/20 36.47 kg/m   11/11/20 36.24 kg/m  11/03/20 36.32 kg/m    Assessment/Interventions: Review of patient past medical history, allergies, medications, health status, including review of consultants reports, laboratory and other test data, was performed as part of comprehensive evaluation and provision of chronic care management services.   SDOH:  (Social Determinants of Health) assessments and interventions performed: Yes  SDOH Screenings   Alcohol Screen: Not on file  Depression (PHQ2-9): Low Risk    PHQ-2 Score: 1  Financial Resource Strain: Not on file  Food Insecurity: No Food Insecurity   Worried About Charity fundraiser in the Last Year: Never true   Ran Out of Food in the Last Year: Never true  Housing: Not on file  Physical Activity: Not on file  Social Connections: Not on file  Stress: Not on file  Tobacco Use: Low Risk    Smoking Tobacco Use: Never   Smokeless Tobacco Use: Never  Transportation Needs: No Transportation Needs   Lack of Transportation (Medical): No   Lack of Transportation (Non-Medical): No    CCM Care Plan  Allergies  Allergen Reactions   Ibuprofen Other (See Comments)    Bleeding events    Medications Reviewed Today     Reviewed by Charlton Haws, [facility] (Pharmacist) on 11/22/20 at [place] List Status: <None>   Medication Order Taking? Sig Documenting Provider Last Dose Status Informant  acetaminophen (TYLENOL) 500 MG tablet [id number] Yes Take 500 mg by mouth every 6 (six) hours as needed for moderate pain. [provider] Taking Active Self  amiodarone (PACERONE) 200 MG tablet [id number] Yes Take 1 tablet (200 mg total) by mouth daily. Eileen Stanford, PA-C Taking Active Self  amLODipine (NORVASC) 10 MG tablet [id number] Yes Take 1 tablet (10 mg total) by mouth daily. Dwyane Dee, MD Taking Active   apixaban (ELIQUIS) 5 MG TABS tablet [id number] Yes Take 1 tablet (5 mg total) by mouth 2 (two) times daily. Biagio Borg, MD Taking Active  Self  atorvastatin (LIPITOR) 40 MG tablet [id number] Yes TAKE 1 TABLET BY MOUTH EVERY DAY Leonie Man, MD Taking Active Self  blood glucose meter kit and supplies [id number] Yes Dispense based on patient and insurance preference. Use up to four times daily as directed. E11.9 Biagio Borg,  MD Taking Active Self  Blood Glucose Monitoring Suppl (ONE TOUCH ULTRA 2) w/Device KIT [id number] Yes Use as directed daily Biagio Borg, MD Taking Active Self  cyanocobalamin ([facility] VITAMIN B12) 1000 MCG tablet [id number] Yes TAKE 1 TABLET BY MOUTH EVERY DAY Biagio Borg, MD Taking Active   furosemide (LASIX) 40 MG tablet [id number] Yes Take 1 tablet (40 mg total) by mouth daily. Eileen Stanford, PA-C Taking Active Self  Lancets (ONETOUCH DELICA PLUS [id number]) [place] [id number] Yes USE AS DIRECTED TEST 1-2 TIMES A DAY Biagio Borg, MD Taking Active Self  losartan (COZAAR) 25 MG tablet [id number] Yes Take 1 tablet (25 mg total) by mouth at bedtime. Dwyane Dee, MD Taking Active   metFORMIN (GLUCOPHAGE) 1000 MG tablet [id number] Yes TAKE 1 TAB BY MOUTH IN THE AM,AND 1/2 TAB IN THE PM  Patient taking differently: TAKE 1/2 TAB(500 mg) BY MOUTH IN THE AM, AND 1/4 TAB(250 mg) IN THE PM   Biagio Borg, MD Taking Active            Med Note Luna Glasgow Nov 22, 2020  9:38 AM)    metoprolol succinate (TOPROL-XL) 25 MG 24 hr tablet [id number] Yes Take 0.5 tablets (12.5 mg total) by mouth daily. Dwyane Dee, MD Taking Active   ondansetron [facility]) 4 MG tablet [id number] No Take 1 tablet (4 mg total) by mouth every 8 (eight) hours as needed for nausea or vomiting.  Patient not taking: Reported on 11/22/2020   Crista Luria Not Taking Active Self           Med Note Lynn Ito   Thu Nov 11, 2020  2:21 PM)    [facility] ULTRA test strip [id number] Yes USE AS INSTRUCTED TEST 1-2 TIMES A DAY Biagio Borg, MD Taking Active Self  pantoprazole (PROTONIX) 40 MG tablet [id number] Yes Take 1 tablet  (40 mg total) by mouth 2 (two) times daily. Eileen Stanford, PA-C Taking Active Self  potassium chloride SA (KLOR-CON) 20 MEQ tablet [id number] Yes Take 1 tablet (20 mEq total) by mouth daily. Eileen Stanford, PA-C Taking Active Self            Patient Active Problem List   Diagnosis Date Noted   Dehydration 11/02/2020   Diabetes mellitus type 2 in obese ([place]) 11/02/2020   AKI (acute kidney injury) ([place]) 11/01/2020   CHB (complete heart block) ([place]) 10/28/2020   S/P TAVR (transcatheter aortic valve replacement) 10/26/2020   Melena 10/20/2020   Symptomatic anemia 10/19/2020   Acute bronchitis 09/21/2020   Paroxysmal atrial fibrillation ([place]) 08/23/2020   Secondary hypercoagulable state ([place]) 08/23/2020   Atrial fibrillation with RVR ([place]) 08/11/2020   History of CVA (cerebrovascular accident) 08/10/2020   Hypomagnesemia 08/10/2020   Aortic atherosclerosis ([place]) 03/18/2020   B12 deficiency 09/21/2019   Cough 03/14/2018   Wheezing 03/14/2018   Anxiety 09/11/2017   Severe aortic stenosis 03/14/2017   Osteoporosis 03/10/2016   Coronary artery disease, non-occlusive 05/24/2015   GI bleed    Pulmonary hypertension (HCC)    Chronic diastolic heart failure ([place]) 05/20/2015   Gastric ulceration    AVM (arteriovenous malformation) of colon    Demand ischemia of myocardium ([place])    Right knee pain 05/21/2014   Anemia, iron deficiency 05/20/2013   MENOPAUSAL DISORDER 12/07/2009   INSOMNIA 09/24/2008   ALLERGIC RHINITIS 06/24/2007   GERD 06/24/2007   COLONIC POLYPS, HX OF 06/24/2007   Hyperlipidemia with target LDL  less than 70 10/08/2006   Depression 10/08/2006   Essential hypertension 10/08/2006   PEPTIC ULCER DISEASE 10/08/2006   Type 2 diabetes mellitus with hyperglycemia, without long-term current use of insulin ([place]) 10/05/2006   Morbid obesity ([place]) 10/05/2006    Immunization History  Administered Date(s) Administered   Fluad Quad(high Dose 65+) 12/26/2018,  12/06/2019, 10/13/2020   Influenza Split 12/21/2010, 12/28/2011   Influenza Whole 12/01/2005, 03/26/2009, 12/07/2009   Influenza, High Dose Seasonal PF 11/19/2012, 11/18/2013, 02/18/2015, 12/28/2015, 12/07/2016, 10/17/2017   PFIZER(Purple Top)SARS-COV-2 Vaccination 05/01/2019, 05/26/2019   Pneumococcal Conjugate-13 12/03/2012   Pneumococcal Polysaccharide-23 11/06/2005   Td 09/24/2008   Tdap 09/13/2018, 09/13/2018    Conditions to be addressed/monitored:  Hypertension, Hyperlipidemia, Diabetes, Atrial Fibrillation, Heart Failure, Coronary Artery Disease, and Anxiety  Care Plan : [place]  Updates made by Charlton Haws, [place] since 11/22/2020 12:00 AM     Problem: Hypertension, Hyperlipidemia, Diabetes, Atrial Fibrillation, Heart Failure, Coronary Artery Disease, and Anxiety   Priority: High     Long-Range Goal: Disease management   Start Date: 11/22/2020  Expected End Date: 11/22/2021  This Visit's Progress: On track  Priority: High  Note:   Current Barriers:  Unable to independently monitor therapeutic efficacy Unable to achieve control of anxiety   Pharmacist Clinical Goal(s):  Patient will achieve adherence to monitoring guidelines and medication adherence to achieve therapeutic efficacy achieve control of anxiety as evidenced by patient report through collaboration with PharmD and provider.   Interventions: 1:1 collaboration with Biagio Borg, MD regarding development and update of comprehensive plan of care as evidenced by provider attestation and co-signature Inter-disciplinary care team collaboration (see longitudinal plan of care) Comprehensive medication review performed; medication list updated in electronic medical record  Hyperlipidemia: (LDL goal < 70) -Controlled - LDL is at goal; pt endorses compliance with statin and denies issues -Hx stroke (08/2020) and aortic atherosclerosis; coronary CTA w/o evidence of obstructive disease, calcium  score 625 -Current treatment: Atorvastatin 40 mg daily AM -Educated on Cholesterol goals; Benefits of statin for ASCVD risk reduction; -Recommended to continue current medication  Diabetes (A1c goal <7%) -Controlled - A1c is at goal; pt endorses compliance with metformin and denies issues -Current medications: Metformin 500 mg AM, 250 mg PM -Medications previously tried: glimepiride   -Educated on A1c and blood sugar goals; Complications of diabetes including kidney damage, retinal damage, and cardiovascular disease; -Recommended to continue current medication  Atrial Fibrillation (Goal: prevent stroke and major bleeding) -Controlled - pt endorses compliance with medications as prescribed; she denies s/sx of bleeding -Dx 08/2020 in s/o acute CVA -Hx complete heart block, aortic stenosis (s/p TAVR 10/26/20) -CHADSVASC: 8 -Current treatment: Amiodarone 200 mg daily AM Metoprolol succinate 25 mg - 1/2 tab daily Eliquis 5 mg BID -Counseled on increased risk of stroke due to Afib and benefits of anticoagulation for stroke prevention; bleeding risk associated with Eliquis and importance of self-monitoring for signs/symptoms of bleeding; avoidance of NSAIDs due to increased bleeding risk with anticoagulants; -Recommended to continue current medication  Heart Failure / Hypertension (Goal: BP < 130/80 and prevent exacerbations) -Controlled - pt reports compliance with medications as prescribed; she reports weighing herself daily - today wt 189 lbs, which is her dry weight; she denies swelling or shortness of breath -Current home BP/HR readings: no monitor at home -Last ejection fraction: 60-65% (Date: 10/26/20) -HF type: Diastolic -Current treatment: Amlodipine 10 mg daily Furosemide 40 mg daily AM Losartan 25 mg daily - HS Metoprolol succinate 25  mg - 1/2 tab daily [place] Con 20 mEq daily AM -Educated on Benefits of medications for managing symptoms and prolonging life; Importance of weighing  daily; if you gain more than 3 pounds in one day or 5 pounds in one week, call cardiology; Proper diuretic administration and potassium supplementation; Importance of blood pressure control -Recommended to move amlodipine and metoprolol to bedtime  Depression/Anxiety (Goal: manage symptoms) -Uncontrolled - pt stopped taking citalopram after a few days due to nausea and worsening "shakes"; she reports she has had anxiety her whole life but it has been much worse recently with worsening health issues; she reports she has not spoken with a therapist since she was a child but is open to talking to someone again -she endorses "full body shakes" that prevent her from driving, writing legibly that have been a problem since her hospitalization; she reports trouble sleeping which exacerbates everything else -she reports her twin sister had issues with anxiety as well and did well with Valium -Current treatment: Alprazolam 0.25 mg TID prn  - not taking, makes her hyper Citalopram 10 mg daily - stopped taking due to nausea, shaking -Medications previously tried/failed: clonazepam -Connected with PCP for mental health support -Educated on Benefits of medication for symptom control; Benefits of cognitive-behavioral therapy with or without medication -Refer to CCM LCSW for mental health support -Recommended mirtazapine 7.5 mg daily for sleep/anxiety benefits  -Recommended diazepam 5 mg PRN for severe anxiety/panic only       -pt has PCP appt today and plans to discuss recommendations then  GI bleed (Goal: manage symptoms) -Controlled - pt endorses compliance with daily PPI; she denies s/sx of bleeding -Hx GI bleed 09/2020 in s/o Eliquis. Stopped aspirin at that time. -Current treatment  Pantoprazole 40 mg BID -Counseled on benefits of PPI for preventing GI bleed; advised to avoid NSAIDs -Recommended to continue current medication  Health Maintenance -Vaccine gaps: none -Current therapy:  Tylenol 500 mg  BID Vitamin B12 1000 mcg daily Ondansetron 4 mg q8h PRN - not nauseous -Recommended to continue current medication  Patient Goals/Self-Care Activities Patient will:  - take medications as prescribed focus on medication adherence by pill box weigh daily, and contact provider if weight gain of 5+ lbs in a week, contact cardiology -Discuss mirtazapine and diazepam with PCP -Speak with social worker       Medication Assistance: None required.  Patient affirms current coverage meets needs.  Compliance/Adherence/Medication fill history: Care Gaps: Eye exam (08/05/19)  Star-Rating Drugs: Atorvastatin - LF 08/20/20 x 90 ds Metformin - LF 11/12/20 x 90 ds  Patient's preferred pharmacy is:  [facility] [place], [place] - [address] [place] [address] Phone: [phone] Fax: [phone]  [id number] - [place], [place] [address] [place] [address] Phone: [phone] Fax: [phone]  [facility] [address] [place] [address] Phone: [phone] Fax: [phone]  Uses pill box? Yes Pt endorses 100% compliance  We discussed: Current pharmacy is preferred with insurance plan and patient is satisfied with pharmacy services Patient decided to: Continue current medication management strategy  Care Plan and Follow Up Patient Decision:  Patient agrees to Care Plan and Follow-up.  Plan: Next PCP appointment scheduled for:  11/22/20 @ 1:40 pm  Charlene Brooke, PharmD, [place], CPP Clinical Pharmacist [facility] [phone]

## 2020-11-22 NOTE — Patient Instructions (Signed)
Visit Information  Phone number for Pharmacist: [phone]   Goals Addressed             This Visit's Progress    Manage My Medicine       Timeframe:  Long-Range Goal Priority:  High Start Date:       11/22/20                      Expected End Date:    11/22/21                   Follow Up Date April 2023   - call for medicine refill 2 or 3 days before it runs out - call if I am sick and can't take my medicine - keep a list of all the medicines I take; vitamins and herbals too - use a pillbox to sort medicine  -weigh daily, and contact provider if weight gain of 5+ lbs in a week, contact cardiology -Discuss mirtazapine and diazepam with PCP   Why is this important?   These steps will help you keep on track with your medicines.   Notes:         Care Plan : CCM Pharmacy Care Plan  Updates made by Kathyrn Sheriff, RPH since 11/22/2020 12:00 AM     Problem: Hypertension, Hyperlipidemia, Diabetes, Atrial Fibrillation, Heart Failure, Coronary Artery Disease, and Anxiety   Priority: High     Long-Range Goal: Disease management   Start Date: 11/22/2020  Expected End Date: 11/22/2021  This Visit's Progress: On track  Priority: High  Note:   Current Barriers:  Unable to independently monitor therapeutic efficacy Unable to achieve control of anxiety   Pharmacist Clinical Goal(s):  Patient will achieve adherence to monitoring guidelines and medication adherence to achieve therapeutic efficacy achieve control of anxiety as evidenced by patient report through collaboration with PharmD and provider.   Interventions: 1:1 collaboration with Corwin Levins, MD regarding development and update of comprehensive plan of care as evidenced by provider attestation and co-signature Inter-disciplinary care team collaboration (see longitudinal plan of care) Comprehensive medication review performed; medication list updated in electronic medical record  Hyperlipidemia: (LDL  goal < 70) -Controlled - LDL is at goal; pt endorses compliance with statin and denies issues -Hx stroke (08/2020) and aortic atherosclerosis; coronary CTA w/o evidence of obstructive disease, calcium score 625 -Current treatment: Atorvastatin 40 mg daily AM -Educated on Cholesterol goals; Benefits of statin for ASCVD risk reduction; -Recommended to continue current medication  Diabetes (A1c goal <7%) -Controlled - A1c is at goal; pt endorses compliance with metformin and denies issues -Current medications: Metformin 500 mg AM, 250 mg PM -Medications previously tried: glimepiride   -Educated on A1c and blood sugar goals; Complications of diabetes including kidney damage, retinal damage, and cardiovascular disease; -Recommended to continue current medication  Atrial Fibrillation (Goal: prevent stroke and major bleeding) -Controlled - pt endorses compliance with medications as prescribed; she denies s/sx of bleeding -Dx 08/2020 in s/o acute CVA -Hx complete heart block, aortic stenosis (s/p TAVR 10/26/20) -CHADSVASC: 8 -Current treatment: Amiodarone 200 mg daily AM Metoprolol succinate 25 mg - 1/2 tab daily Eliquis 5 mg BID -Counseled on increased risk of stroke due to Afib and benefits of anticoagulation for stroke prevention; bleeding risk associated with Eliquis and importance of self-monitoring for signs/symptoms of bleeding; avoidance of NSAIDs due to increased bleeding risk with anticoagulants; -Recommended to continue current medication  Heart Failure / Hypertension (Goal: BP <  130/80 and prevent exacerbations) -Controlled - pt reports compliance with medications as prescribed; she reports weighing herself daily - today wt 189 lbs, which is her dry weight; she denies swelling or shortness of breath -Current home BP/HR readings: no monitor at home -Last ejection fraction: 60-65% (Date: 10/26/20) -HF type: Diastolic -Current treatment: Amlodipine 10 mg daily Furosemide 40 mg daily  AM Losartan 25 mg daily - HS Metoprolol succinate 25 mg - 1/2 tab daily Klor Con 20 mEq daily AM -Educated on Benefits of medications for managing symptoms and prolonging life; Importance of weighing daily; if you gain more than 3 pounds in one day or 5 pounds in one week, call cardiology; Proper diuretic administration and potassium supplementation; Importance of blood pressure control -Recommended to move amlodipine and metoprolol to bedtime  Depression/Anxiety (Goal: manage symptoms) -Uncontrolled - pt stopped taking citalopram after a few days due to nausea and worsening "shakes"; she reports she has had anxiety her whole life but it has been much worse recently with worsening health issues; she reports she has not spoken with a therapist since she was a child but is open to talking to someone again -she endorses "full body shakes" that prevent her from driving, writing legibly that have been a problem since her hospitalization; she reports trouble sleeping which exacerbates everything else -she reports her twin sister had issues with anxiety as well and did well with Valium -Current treatment: Alprazolam 0.25 mg TID prn  - not taking, makes her hyper Citalopram 10 mg daily - stopped taking due to nausea, shaking -Medications previously tried/failed: clonazepam -Connected with PCP for mental health support -Educated on Benefits of medication for symptom control; Benefits of cognitive-behavioral therapy with or without medication -Refer to CCM LCSW for mental health support -Recommended mirtazapine 7.5 mg daily for sleep/anxiety benefits  -Recommended diazepam 5 mg PRN for severe anxiety/panic only       -pt has PCP appt today and plans to discuss recommendations then  GI bleed (Goal: manage symptoms) -Controlled - pt endorses compliance with daily PPI; she denies s/sx of bleeding -Hx GI bleed 09/2020 in s/o Eliquis. Stopped aspirin at that time. -Current treatment  Pantoprazole 40 mg  BID -Counseled on benefits of PPI for preventing GI bleed; advised to avoid NSAIDs -Recommended to continue current medication  Health Maintenance -Vaccine gaps: none -Current therapy:  Tylenol 500 mg BID Vitamin B12 1000 mcg daily Ondansetron 4 mg q8h PRN - not nauseous -Recommended to continue current medication  Patient Goals/Self-Care Activities Patient will:  - take medications as prescribed focus on medication adherence by pill box weigh daily, and contact provider if weight gain of 5+ lbs in a week, contact cardiology -Discuss mirtazapine and diazepam with PCP -Speak with social worker       The patient verbalized understanding of instructions, educational materials, and care plan provided today and declined offer to receive copy of patient instructions, educational materials, and care plan.  Telephone follow up appointment with pharmacy team member scheduled for: 6 months  Al Corpus, PharmD, [place], CPP Clinical Pharmacist [facility] [phone]

## 2020-11-22 NOTE — Chronic Care Management (AMB) (Signed)
  Care Management   Note  11/22/2020 Name: Enedina Pair MRN: [id number] DOB: 05/17/1940  Jacoya Bauman is a 80 y.o. year old female who is a primary care patient of Corwin Levins, MD and is actively engaged with the care management team. I reached out to [name] by phone today to assist with scheduling an initial visit with the Licensed Clinical Social Worker  Follow up plan: Telephone appointment with care management team member scheduled for:12/01/20  Gwenevere Ghazi  Care Guide, Embedded Care Coordination [facility]  Care Management  Direct Dial: [phone]

## 2020-11-22 NOTE — Assessment & Plan Note (Signed)
Worsening, for remeron 15 qhs  to f/u any worsening symptoms or concerns

## 2020-11-22 NOTE — Assessment & Plan Note (Signed)
Also for remeron 15 hs

## 2020-11-22 NOTE — Progress Notes (Signed)
Patient ID: Mary Mccarthy, female   DOB: 05-21-1940, 80 y.o.   MRN: [id number]  Virtual Visit via Video Note  I connected with Mary Mccarthy on 11/22/20 at  1:40 PM EDT by a video enabled telemedicine application and verified that I am speaking with the correct person using two identifiers.  Location of all participants today Patient: at home Provider: at office   I discussed the limitations of evaluation and management by telemedicine and the availability of in person appointments. The patient expressed understanding and agreed to proceed.  History of Present Illness: Here to f/u anxiety, unfortunately had nausea with celexa so stopped after one dose.  Also has difficulty sleeping most nights getting to sleep.  Pt denies chest pain, increased sob or doe, wheezing, orthopnea, PND, increased LE swelling, palpitations, dizziness or syncope.   Pt denies polydipsia, polyuria, or new focal neuro s/s.  Denies worsening depressive symptoms, suicidal ideation, or panic; has ongoing anxiety, much increased recently.   Also had recent ONO and found to have significant nocturnal low o2 sats, qualifies for home o2 nocturnal. Past Medical History:  Diagnosis Date   ALLERGIC RHINITIS 06/24/2007   Anemia    AVM (arteriovenous malformation) of colon    Blood transfusion without reported diagnosis 05/2015   COLONIC POLYPS, HX OF 06/24/2007   Coronary artery disease, non-occlusive 05/2015   Trop + w/ Acute Anemia =>CATH: small RI - Ostial 60%, ostial RCA 30% and dLAD 40-50%;; 9/'21: Cor Ca Score 624. Mild (25-49%) prox RCA & LAD,; Moderate (50-69%) Ostial Small RI & prox LCx.     DEPRESSION 10/08/2006   DIABETES MELLITUS, TYPE II 10/05/2006   GERD 06/24/2007   History of CVA (cerebrovascular accident)    08/2020- found to be in afib with RVR, started on Eliquis   HYPERLIPIDEMIA 10/08/2006   HYPERTENSION 10/08/2006   INSOMNIA 09/24/2008   Left knee DJD    Osteoporosis 03/10/2016   PAF (paroxysmal atrial  fibrillation) ([place])    PEPTIC ULCER DISEASE 10/08/2006   S/P TAVR (transcatheter aortic valve replacement) 10/26/2020   s/p TAVR with a 26 mm Medtronic Evolut Pro+ via the TF approach by Dr. Angelena Form & Dr. Cyndia Bent   Severe aortic stenosis    Past Surgical History:  Procedure Laterality Date   APPENDECTOMY     BIOPSY  10/21/2020   Procedure: BIOPSY;  Surgeon: Sharyn Creamer, MD;  Location: [facility] ENDOSCOPY;  Service: Gastroenterology;;   BREAST BIOPSY     CARDIAC CATHETERIZATION N/A 05/21/2015   Procedure: Left Heart Cath and Coronary Angiography;  Surgeon: Leonie Man, MD;  Location: [place] CV LAB;  Service: Cardiovascular;: Ost RI 60%, Ost RCA 30%, dLAD tapers to small vessel w/ 40-50%. Mildly elevated LVEDP. Normal LV Fxn.   COLONOSCOPY N/A 05/20/2015   Procedure: COLONOSCOPY;  Surgeon: Manus Gunning, MD;  Location: [place];  Service: Gastroenterology;  Laterality: N/A;   CORONARY CA2+ SCORE / CARDIAC CT ANGIOGRAM  10/09/2019   Calcium score 624.  82nd percentile. Dominant RCA: Mild (25-49%) proximal stenosis-distal bifurcation into PDA and PAV--< RPL branches.  LAD (1 major mid vessel diagonal) diffuse calcified plaque, mild proximal stenosis with minimal distal stenosis.  Small RI moderate ostial disease.  LCx-moderate mixed (50-69%) proximal stenosis.  Small dOM1 disease.  Trileaflet AoV, annular Ca2+ - probable AS   ESOPHAGOGASTRODUODENOSCOPY N/A 05/20/2015   Procedure: ESOPHAGOGASTRODUODENOSCOPY (EGD);  Surgeon: Manus Gunning, MD;  Location: [place];  Service: Gastroenterology;  Laterality: N/A;   ESOPHAGOGASTRODUODENOSCOPY (EGD) WITH  PROPOFOL N/A 10/21/2020   Procedure: ESOPHAGOGASTRODUODENOSCOPY (EGD) WITH PROPOFOL;  Surgeon: Sharyn Creamer, MD;  Location: [place];  Service: Gastroenterology;  Laterality: N/A;   INTRAOPERATIVE TRANSTHORACIC ECHOCARDIOGRAM N/A 10/26/2020   Procedure: INTRAOPERATIVE TRANSTHORACIC ECHOCARDIOGRAM;  Surgeon: Burnell Blanks, MD;  Location: [place];  Service: Open Heart Surgery;  Laterality: N/A;   RIGHT/LEFT HEART CATH AND CORONARY ANGIOGRAPHY N/A 09/29/2020   Procedure: RIGHT/LEFT HEART CATH AND CORONARY ANGIOGRAPHY;  Surgeon: Burnell Blanks, MD;  Location: [place] [facility];  Service: Cardiovascular;  Laterality: N/A;   TONSILLECTOMY     TRANSCATHETER AORTIC VALVE REPLACEMENT, TRANSFEMORAL N/A 10/26/2020   Procedure: TRANSCATHETER AORTIC VALVE REPLACEMENT, TRANSFEMORAL;  Surgeon: Burnell Blanks, MD;  Location: [place];  Service: Open Heart Surgery;  Laterality: N/A;   TRANSTHORACIC ECHOCARDIOGRAM  03/2019; 09/2019   a) EF 60 to 65%.  Moderate LVH.  GRII DD.  Mod-Severe AS (m grad 36 mmHg, peak 59 mmHg); b) EF 65-70%, No RWMA. Gr1 DD/hi LAP, Mild hi PAP. Mod LA Dil. MOD AS (mean Grad 34.5 mmHg).  STABLE    TRANSTHORACIC ECHOCARDIOGRAM  04/12/2017   EF 60-65%. No RWMA.  GR 1 DD.  Moderate concentric LVH.  Mild LA dilation. Severe calcified aortic valve with moderate-severe aortic stenosis (peak/mean gradients 60/34 mmHg) and in severe range by AVA (0.8 cm2).  Mild to moderately increased PA pressures 41 mmHg with normal RV size and function..     TUBAL LIGATION      reports that she has never smoked. She has never used smokeless tobacco. She reports that she does not drink alcohol and does not use drugs. family history includes Alcohol abuse in an other family member; Arrhythmia in her sister; Arthritis in an other family member; CAD in her sister; Cancer in her mother; Depression in an other family member; Diabetes in an other family member; Heart attack (age of onset: 75) in her sister; Heart disease in an other family member; Hyperlipidemia in an other family member; Hypertension in an other family member; Stroke in an other family member. Allergies  Allergen Reactions   Ibuprofen Other (See Comments)    Bleeding events   Current Outpatient Medications on File Prior to Visit  Medication  Sig Dispense Refill   acetaminophen (TYLENOL) 500 MG tablet Take 500 mg by mouth every 6 (six) hours as needed for moderate pain.     amiodarone (PACERONE) 200 MG tablet Take 1 tablet (200 mg total) by mouth daily. 90 tablet 6   amLODipine (NORVASC) 10 MG tablet Take 1 tablet (10 mg total) by mouth daily. 90 tablet 3   apixaban (ELIQUIS) 5 MG TABS tablet Take 1 tablet (5 mg total) by mouth 2 (two) times daily. 60 tablet 5   atorvastatin (LIPITOR) 40 MG tablet TAKE 1 TABLET BY MOUTH EVERY DAY 90 tablet 3   blood glucose meter kit and supplies Dispense based on patient and insurance preference. Use up to four times daily as directed. E11.9 1 each 0   Blood Glucose Monitoring Suppl (ONE TOUCH ULTRA 2) w/Device KIT Use as directed daily 1 each 0   cyanocobalamin ([employer] VITAMIN B12) 1000 MCG tablet TAKE 1 TABLET BY MOUTH EVERY DAY 90 tablet 3   furosemide (LASIX) 40 MG tablet Take 1 tablet (40 mg total) by mouth daily. 90 tablet 1   Lancets (ONETOUCH DELICA PLUS [id number]) MISC USE AS DIRECTED TEST 1-2 TIMES A DAY 100 each 23   losartan (COZAAR) 25 MG tablet Take  1 tablet (25 mg total) by mouth at bedtime. 30 tablet 3   metFORMIN (GLUCOPHAGE) 1000 MG tablet TAKE 1 TAB BY MOUTH IN THE AM,AND 1/2 TAB IN THE PM (Patient taking differently: TAKE 1/2 TAB(500 mg) BY MOUTH IN THE AM, AND 1/4 TAB(250 mg) IN THE PM) 135 tablet 1   metoprolol succinate (TOPROL-XL) 25 MG 24 hr tablet Take 0.5 tablets (12.5 mg total) by mouth daily. 30 tablet 3   ondansetron (ZOFRAN) 4 MG tablet Take 1 tablet (4 mg total) by mouth every 8 (eight) hours as needed for nausea or vomiting. (Patient not taking: Reported on 11/22/2020) 20 tablet 0   ONETOUCH ULTRA test strip USE AS INSTRUCTED TEST 1-2 TIMES A DAY 100 strip 23   pantoprazole (PROTONIX) 40 MG tablet Take 1 tablet (40 mg total) by mouth 2 (two) times daily. 120 tablet 0   potassium chloride SA (KLOR-CON) 20 MEQ tablet Take 1 tablet (20 mEq total) by mouth daily. 90 tablet 1    No current facility-administered medications on file prior to visit.    Observations/Objective: Alert, NAD, appropriate mood and affect, resps normal, cn 2-12 intact, moves all 4s, no visible rash or swelling Lab Results  Component Value Date   WBC 4.6 11/11/2020   HGB 9.3 (L) 11/11/2020   HCT 28.0 (L) 11/11/2020   PLT 296 11/11/2020   GLUCOSE 182 (H) 11/11/2020   CHOL 97 08/11/2020   TRIG 86 08/11/2020   HDL 32 (L) 08/11/2020   LDLCALC 48 08/11/2020   ALT 21 11/01/2020   AST 32 11/01/2020   NA 141 11/11/2020   K 4.7 11/11/2020   CL 104 11/11/2020   CREATININE 1.13 (H) 11/11/2020   BUN 18 11/11/2020   CO2 20 11/11/2020   TSH 5.820 (H) 11/11/2020   INR 1.7 (H) 11/01/2020   HGBA1C 5.8 (H) 10/26/2020   MICROALBUR 1.8 03/18/2020   Assessment and Plan: See notes  Follow Up Instructions: See notes   I discussed the assessment and treatment plan with the patient. The patient was provided an opportunity to ask questions and all were answered. The patient agreed with the plan and demonstrated an understanding of the instructions.   The patient was advised to call back or seek an in-person evaluation if the symptoms worsen or if the condition fails to improve as anticipated.  Cathlean Cower, MD '

## 2020-11-22 NOTE — Patient Instructions (Signed)
You will be contacted regarding the referral for: home oxygen 2L at night per [employer]  Please take all new medication as prescribed  - remeron 15 mg qhs

## 2020-11-22 NOTE — Assessment & Plan Note (Signed)
With overnight sat often to 80%, now for home o2 2L nocturnal per [employer] to start

## 2020-11-24 DIAGNOSIS — I509 Heart failure, unspecified: Secondary | ICD-10-CM | POA: Diagnosis not present

## 2020-11-25 ENCOUNTER — Encounter: Payer: Self-pay | Admitting: Physician Assistant

## 2020-11-25 ENCOUNTER — Other Ambulatory Visit: Payer: Self-pay

## 2020-11-25 ENCOUNTER — Ambulatory Visit (HOSPITAL_COMMUNITY): Payer: Medicare Other | Attending: Internal Medicine

## 2020-11-25 ENCOUNTER — Ambulatory Visit: Payer: Medicare Other | Admitting: Physician Assistant

## 2020-11-25 VITALS — BP 144/62 | HR 60 | Ht 61.0 in | Wt 191.3 lb

## 2020-11-25 DIAGNOSIS — I442 Atrioventricular block, complete: Secondary | ICD-10-CM | POA: Diagnosis not present

## 2020-11-25 DIAGNOSIS — Z952 Presence of prosthetic heart valve: Secondary | ICD-10-CM | POA: Diagnosis not present

## 2020-11-25 DIAGNOSIS — F419 Anxiety disorder, unspecified: Secondary | ICD-10-CM

## 2020-11-25 DIAGNOSIS — I480 Paroxysmal atrial fibrillation: Secondary | ICD-10-CM | POA: Diagnosis not present

## 2020-11-25 DIAGNOSIS — I10 Essential (primary) hypertension: Secondary | ICD-10-CM

## 2020-11-25 DIAGNOSIS — I5032 Chronic diastolic (congestive) heart failure: Secondary | ICD-10-CM | POA: Diagnosis not present

## 2020-11-25 DIAGNOSIS — D649 Anemia, unspecified: Secondary | ICD-10-CM | POA: Diagnosis not present

## 2020-11-25 HISTORY — PX: TRANSTHORACIC ECHOCARDIOGRAM: SHX275

## 2020-11-25 LAB — ECHOCARDIOGRAM COMPLETE
AR max vel: 1.88 cm2
AV Area VTI: 2.05 cm2
AV Area mean vel: 2.04 cm2
AV Mean grad: 11 mmHg
AV Peak grad: 23.8 mmHg
Ao pk vel: 2.44 m/s
Area-P 1/2: 3.27 cm2
MV VTI: 1.69 cm2
P 1/2 time: 890 msec
S' Lateral: 3.8 cm

## 2020-11-25 NOTE — Patient Instructions (Signed)
Medication Instructions:  Your physician recommends that you continue on your current medications as directed. Please refer to the Current Medication list given to you today.  *If you need a refill on your cardiac medications before your next appointment, please call your pharmacy*   Lab Work: None ordered   If you have labs (blood work) drawn today and your tests are completely normal, you will receive your results only by: MyChart Message (if you have MyChart) OR A paper copy in the mail If you have any lab test that is abnormal or we need to change your treatment, we will call you to review the results.   Testing/Procedures: None ordered    Follow-Up: None ordered   Other Instructions None

## 2020-11-25 NOTE — Progress Notes (Signed)
[facility] [place]                                     Cardiology Office Note:    Date:  11/26/2020   ID:  Mary Mccarthy, DOB August 04, 1940, MRN [id number]  PCP:  Biagio Borg, MD  [facility] Cardiologist:  Glenetta Hew, MD / Dr. Angelena Form & Dr. Cyndia Bent (TAVR) [facility] Electrophysiologist:  None   Referring MD: Biagio Borg, MD   1 month s/p TAVR  History of Present Illness:    Mary Mccarthy is a 80 y.o. female with a hx of obesity, DMT2, non obstructive CAD, PUD/AVM with recent GI bleed, PAF on amio and Eliquis (initially diagnosed at the time of acute CVA in 08/2020), HTN, pulmonary HTN, chronic diastolic CHF, and severe AS s/p TAVR (10/27/20) with readmission for AKI who presents to clinic for follow up.   She was admitted in 2017 with urosepsis, AKI and acute anemia with a hemoglobin of 5 requiring transfusion.  She underwent EGD and colonoscopy which showed several areas of potential GI bleeding but no current bleeding.  She was found to have AVMs and peptic ulcer disease.   She was admitted in July 2022 with a stroke and diagnosed with new atrial fibrillation at that time.  MRI of the brain showed small acute infarct diffusion bilaterally. She was started on Eliquis.  Echo at that time showed EF 55 to 60%, moderately elevated pulmonary hypertension, severe concentric LVH, severe mitral annular calcification and severe aortic stenosis with a mean gradient of 42 mmHg, peak gradient of 76 mmHg, AVA of 0.73 cm, DVI 0.27.    She was then readmitted in August 2022 with acute on chronic diastolic CHF in the setting of severe aortic stenosis.  She was diuresed with IV Lasix.  She also had atrial fibrillation with RVR that converted to sinus with IV amiodarone.  Cardiology was consulted and she was seen by Dr. Burt Knack with the structural heart team.  She underwent left and right heart cath which showed nonobstructive CAD with severe aortic  stenosis with a mean gradient of 53 mmHg and pre TAVR CTs with plans for outpatient follow up.   However, she was readmitted 9/12-9/22/22 for symptomatic anemia 2/2 GI bleed. Hg 7.4 on admission. Transfused with 1 unit PRBCs. FOBT +. EDG 9/15 showed gastritis with no signs of active bleeding but stigmata of recent bleeding.  It was suspected that the patient's anemia and melena was due to bleeding from gastric erosions in the setting of Eliquis use. Colonoscopy was deferred given severe AS and risk with sedation. She underwent successful TAVR with a 26 mm Medtronic Evolut Pro + THV via the TF approach on 10/27/20. She developed CHB intraoperatively that ultimately resolved. Post operative echo showed EF 60%, normally functioning TAVR with a mean gradient of 12 mmHg and trivial PVL. She was discharged on Eliquis without concurrent antiplatelet therapy given recent GI bleeding. She was also discharged with a Zio AT. Coreg held at DC.   She was then readmitted 9/26-9/29/22 for n/v/diarrhea and AKI and lactic acidosis which improved with fluids. Hg stable 8-9. Repeat Echo 11/02/20 with EF 60-65%, no RMWA, no AI or AS, 26 mm Sapien prosthetic (TAVR) valve present in the aortic position, normal structure and function of the aortic valve prosthesis. Tele showed intermittent A fib RVR with ventricular rate  up to 130s and NSVT lasting up to 9 runs. She was started on metoprolol XL 12.5mg . Seen for post hopsital follow up and still having significant dyspnea and anxiety. Follow up labs showed BNP 510, Hg 9.3, creat 1.13, K 4.7.  Today the patient presents to clinic for follow up. Patient is feeling better. Still has orthopnea and shortness of breath. No chest pain. No dizziness or syncope. No LE edema or PND. Mostly struggles with panic and anxiety.    Past Medical History:  Diagnosis Date   ALLERGIC RHINITIS 06/24/2007   Anemia    AVM (arteriovenous malformation) of colon    Blood transfusion without reported  diagnosis 05/2015   COLONIC POLYPS, HX OF 06/24/2007   Coronary artery disease, non-occlusive 05/2015   Trop + w/ Acute Anemia =>CATH: small RI - Ostial 60%, ostial RCA 30% and dLAD 40-50%;; 9/'21: Cor Ca Score 624. Mild (25-49%) prox RCA & LAD,; Moderate (50-69%) Ostial Small RI & prox LCx.     DEPRESSION 10/08/2006   DIABETES MELLITUS, TYPE II 10/05/2006   GERD 06/24/2007   History of CVA (cerebrovascular accident)    08/2020- found to be in afib with RVR, started on Eliquis   HYPERLIPIDEMIA 10/08/2006   HYPERTENSION 10/08/2006   INSOMNIA 09/24/2008   Left knee DJD    Osteoporosis 03/10/2016   PAF (paroxysmal atrial fibrillation) (HCC)    PEPTIC ULCER DISEASE 10/08/2006   S/P TAVR (transcatheter aortic valve replacement) 10/26/2020   s/p TAVR with a 26 mm Medtronic Evolut Pro+ via the TF approach by Dr. Clifton James & Dr. Laneta Simmers   Severe aortic stenosis     Past Surgical History:  Procedure Laterality Date   APPENDECTOMY     BIOPSY  10/21/2020   Procedure: BIOPSY;  Surgeon: Imogene Burn, MD;  Location: [facility] ENDOSCOPY;  Service: Gastroenterology;;   BREAST BIOPSY     CARDIAC CATHETERIZATION N/A 05/21/2015   Procedure: Left Heart Cath and Coronary Angiography;  Surgeon: [name], MD;  Location: [facility] INVASIVE CV LAB;  Service: Cardiovascular;: Ost RI 60%, Ost RCA 30%, dLAD tapers to small vessel w/ 40-50%. Mildly elevated LVEDP. Normal LV Fxn.   COLONOSCOPY N/A 05/20/2015   Procedure: COLONOSCOPY;  Surgeon: Ruffin Frederick, MD;  Location: [facility] ENDOSCOPY;  Service: Gastroenterology;  Laterality: N/A;   CORONARY CA2+ SCORE / CARDIAC CT ANGIOGRAM  10/09/2019   Calcium score 624.  82nd percentile. Dominant RCA: Mild (25-49%) proximal stenosis-distal bifurcation into PDA and PAV--< RPL branches.  LAD (1 major mid vessel diagonal) diffuse calcified plaque, mild proximal stenosis with minimal distal stenosis.  Small RI moderate ostial disease.  LCx-moderate mixed (50-69%) proximal stenosis.   Small dOM1 disease.  Trileaflet AoV, annular Ca2+ - probable AS   ESOPHAGOGASTRODUODENOSCOPY N/A 05/20/2015   Procedure: ESOPHAGOGASTRODUODENOSCOPY (EGD);  Surgeon: Ruffin Frederick, MD;  Location: [facility] ENDOSCOPY;  Service: Gastroenterology;  Laterality: N/A;   ESOPHAGOGASTRODUODENOSCOPY (EGD) WITH PROPOFOL N/A 10/21/2020   Procedure: ESOPHAGOGASTRODUODENOSCOPY (EGD) WITH PROPOFOL;  Surgeon: Imogene Burn, MD;  Location: [facility] ENDOSCOPY;  Service: Gastroenterology;  Laterality: N/A;   INTRAOPERATIVE TRANSTHORACIC ECHOCARDIOGRAM N/A 10/26/2020   Procedure: INTRAOPERATIVE TRANSTHORACIC ECHOCARDIOGRAM;  Surgeon: Kathleene Hazel, MD;  Location: [facility] OR;  Service: Open Heart Surgery;  Laterality: N/A;   RIGHT/LEFT HEART CATH AND CORONARY ANGIOGRAPHY N/A 09/29/2020   Procedure: RIGHT/LEFT HEART CATH AND CORONARY ANGIOGRAPHY;  Surgeon: Kathleene Hazel, MD;  Location: [facility] INVASIVE CV LAB;  Service: Cardiovascular;  Laterality: N/A;   TONSILLECTOMY  TRANSCATHETER AORTIC VALVE REPLACEMENT, TRANSFEMORAL N/A 10/26/2020   Procedure: TRANSCATHETER AORTIC VALVE REPLACEMENT, TRANSFEMORAL;  Surgeon: Burnell Blanks, MD;  Location: [facility];  Service: Open Heart Surgery;  Laterality: N/A;   TRANSTHORACIC ECHOCARDIOGRAM  03/2019; 09/2019   a) EF 60 to 65%.  Moderate LVH.  GRII DD.  Mod-Severe AS (m grad 36 mmHg, peak 59 mmHg); b) EF 65-70%, No RWMA. Gr1 DD/hi LAP, Mild hi PAP. Mod LA Dil. MOD AS (mean Grad 34.5 mmHg).  STABLE    TRANSTHORACIC ECHOCARDIOGRAM  04/12/2017   EF 60-65%. No RWMA.  GR 1 DD.  Moderate concentric LVH.  Mild LA dilation. Severe calcified aortic valve with moderate-severe aortic stenosis (peak/mean gradients 60/34 mmHg) and in severe range by AVA (0.8 cm2).  Mild to moderately increased PA pressures 41 mmHg with normal RV size and function..     TUBAL LIGATION      Current Medications: Current Meds  Medication Sig   acetaminophen (TYLENOL) 500 MG tablet Take 500 mg by  mouth every 6 (six) hours as needed for moderate pain.   amiodarone (PACERONE) 200 MG tablet Take 1 tablet (200 mg total) by mouth daily.   amLODipine (NORVASC) 10 MG tablet Take 1 tablet (10 mg total) by mouth daily.   apixaban (ELIQUIS) 5 MG TABS tablet Take 1 tablet (5 mg total) by mouth 2 (two) times daily.   atorvastatin (LIPITOR) 40 MG tablet TAKE 1 TABLET BY MOUTH EVERY DAY   blood glucose meter kit and supplies Dispense based on patient and insurance preference. Use up to four times daily as directed. E11.9   Blood Glucose Monitoring Suppl (ONE TOUCH ULTRA 2) w/Device KIT Use as directed daily   cyanocobalamin ([employer] VITAMIN B12) 1000 MCG tablet TAKE 1 TABLET BY MOUTH EVERY DAY   furosemide (LASIX) 40 MG tablet Take 1 tablet (40 mg total) by mouth daily.   Lancets (ONETOUCH DELICA PLUS [id number]) MISC USE AS DIRECTED TEST 1-2 TIMES A DAY   losartan (COZAAR) 25 MG tablet Take 1 tablet (25 mg total) by mouth at bedtime.   metFORMIN (GLUCOPHAGE) 1000 MG tablet TAKE 1 TAB BY MOUTH IN THE AM,AND 1/2 TAB IN THE PM (Patient taking differently: TAKE 1/2 TAB(500 mg) BY MOUTH IN THE AM, AND 1/4 TAB(250 mg) IN THE PM)   metoprolol succinate (TOPROL-XL) 25 MG 24 hr tablet Take 0.5 tablets (12.5 mg total) by mouth daily.   mirtazapine (REMERON) 15 MG tablet Take 1 tablet (15 mg total) by mouth at bedtime.   ondansetron (ZOFRAN) 4 MG tablet Take 1 tablet (4 mg total) by mouth every 8 (eight) hours as needed for nausea or vomiting.   ONETOUCH ULTRA test strip USE AS INSTRUCTED TEST 1-2 TIMES A DAY   pantoprazole (PROTONIX) 40 MG tablet Take 1 tablet (40 mg total) by mouth 2 (two) times daily.   potassium chloride SA (KLOR-CON) 20 MEQ tablet Take 1 tablet (20 mEq total) by mouth daily.     Allergies:   Ibuprofen   Social History   Socioeconomic History   Marital status: Widowed    Spouse name: Not on file   Number of children: 1   Years of education: Not on file   Highest education level: Not on  file  Occupational History   Occupation: retired - Social research officer, government: RETIRED  Tobacco Use   Smoking status: Never   Smokeless tobacco: Never  Vaping Use   Vaping Use: Never used  Substance and  Sexual Activity   Alcohol use: No    Alcohol/week: 0.0 standard drinks   Drug use: No   Sexual activity: Not on file  Other Topics Concern   Not on file  Social History Narrative   Recently widowed--husband died in 12/02/18.      Now lives alone but her son usually stays with her at night.  During the day she has 2 nephews who live nearby to come in and check on her intermittently.  Also one of her nieces calls routinely.      When her son is home and awake, she may try to walk on a treadmill, but is scared to walk outside.   Social Determinants of Health   Financial Resource Strain: Not on file  Food Insecurity: No Food Insecurity   Worried About Charity fundraiser in the Last Year: Never true   Ran Out of Food in the Last Year: Never true  Transportation Needs: No Transportation Needs   Lack of Transportation (Medical): No   Lack of Transportation (Non-Medical): No  Physical Activity: Not on file  Stress: Not on file  Social Connections: Not on file     Family History: The patient's family history includes Alcohol abuse in an other family member; Arrhythmia in her sister; Arthritis in an other family member; CAD in her sister; Cancer in her mother; Depression in an other family member; Diabetes in an other family member; Heart attack (age of onset: 66) in her sister; Heart disease in an other family member; Hyperlipidemia in an other family member; Hypertension in an other family member; Stroke in an other family member.  ROS:   Please see the history of present illness.    All other systems reviewed and are negative.  EKGs/Labs/Other Studies Reviewed:    The following studies were reviewed today:   TAVR OPERATIVE NOTE   Date of Procedure:                  10/26/2020   Preoperative Diagnosis:      Severe Aortic Stenosis    Postoperative Diagnosis:    Same    Procedure:        Transcatheter Aortic Valve Replacement - Percutaneous Right Transfemoral Approach             Medtronic Evolut-Pro + (size 26 mm, model # EVPROPLUS -26US, serial # [id number])              Co-Surgeons:            Gaye Pollack, MD and Lauree Chandler, MD     Anesthesiologist:                  Gennie Alma, MD   Echocardiographer:              Edmonia James, MD   Pre-operative Echo Findings: Severe aortic stenosis and moderate AI  Normal left ventricular systolic function   Post-operative Echo Findings: No paravalvular leak Normal left ventricular systolic function      _____________    Echo 11/02/20:  1. Left ventricular ejection fraction, by estimation, is 60 to 65%. The  left ventricle has normal function. The left ventricle has no regional  wall motion abnormalities. There is mild left ventricular hypertrophy.  Left ventricular diastolic parameters are indeterminate.   2. Right ventricular systolic function is normal. The right ventricular  size is normal.   3. Left atrial size was moderately dilated.   4. The mitral  valve is normal in structure. No evidence of mitral valve  regurgitation. No evidence of mitral stenosis.   5. The aortic valve has been repaired/replaced. Aortic valve  regurgitation is not visualized. No aortic stenosis is present. There is a  26 mm Sapien prosthetic (TAVR) valve present in the aortic position.  Procedure Date: 10/26/2020. Echo findings are  consistent with normal structure and function of the aortic valve  prosthesis. Aortic valve area, by VTI measures 1.37 cm. Aortic valve mean gradient measures 9.0 mmHg. Aortic valve Vmax measures 2.16 m/s.   6. The inferior vena cava is normal in size with greater than 50%  respiratory variability, suggesting right atrial pressure of 3 mmHg.   _____________      Echo  10/27/20:  1. Left ventricular ejection fraction, by estimation, is 60 to 65%. The  left ventricle has normal function. The left ventricle has no regional  wall motion abnormalities. There is mild left ventricular hypertrophy.  Left ventricular diastolic parameters  are indeterminate.   2. Right ventricular systolic function is normal. The right ventricular  size is normal.   3. Left atrial size was severely dilated.   4. Right atrial size was mildly dilated.   5. The mitral valve is degenerative. Mild mitral valve regurgitation.  Severe mitral annular calcification.   6. Post TAVR with 26 mm supra annular Medtronic Evolut Pro valve trivial  PvL mean gradient 12 peak 24 mmHg AVA 1.8 cm2 with DVI 0.63. The aortic  valve has been repaired/replaced. Aortic valve regurgitation is trivial.  Procedure Date: 10/26/2020.      _____________     Elwyn Reach AT 9/22-10/5/22 Patch Wear Time:  12 days and 19 hours (2022-09-22T09:52:09-398 to 2022-10-05T05:32:17-0400)   Patient had a min HR of 49 bpm, max HR of 231 bpm, and avg HR of 74 bpm. Predominant underlying rhythm was Sinus Rhythm. 206 Ventricular Tachycardia runs occurred, the run with the fastest interval lasting 5 beats with a max rate of 231 bpm, the longest  lasting 9.3 secs with an avg rate of 175 bpm. Episode(s) of Ventricular Tachycardia may be possible Atrial Fibrillation with aberrancy. Atrial Fibrillation occurred (16% burden), ranging from 62-179 bpm (avg of 105 bpm), the longest lasting 17 hours 55  mins with an avg rate of 100 bpm. Some episodes of Atrial Fibrillation conducted with possible aberrancy. Isolated SVEs were rare (<1.0%), SVE Couplets were rare (<1.0%), and SVE Triplets were rare (<1.0%). Isolated VEs were rare (<1.0%, 6271), VE  Couplets were rare (<1.0%, 2545), and VE Triplets were rare (<1.0%, 66). Difficulty discerning atrial activity making definitive diagnosis difficult to ascertain. Previously notified: MD notification  criteria for First Documentation of Atrial  Fibrillation met - attempt was made to call account numerous times on 10/28/2020 at 8:18, 8:56 and 9:41 PM, left messages, no answer (BM).  _____________    Echo 11/25/20 IMPRESSIONS   1. Left ventricular ejection fraction, by estimation, is 55 to 60%. The  left ventricle has normal function. The left ventricle has no regional  wall motion abnormalities. There is mild left ventricular hypertrophy.  Left ventricular diastolic parameters  are consistent with Grade I diastolic dysfunction (impaired relaxation).  Elevated left ventricular end-diastolic pressure. The E/e' is 20.   2. Oscillating density seen in the RV, inferior to the TV (images 45-46),  suspect calcified chordal apparatus or less likely calcified  thrombus/vegetaton -clinical correlation advised. Right ventricular  systolic function is normal. The right ventricular   size is normal. There is  mildly elevated pulmonary artery systolic  pressure. The estimated right ventricular systolic pressure is 95.2 mmHg.   3. Left atrial size was moderately dilated.   4. The mitral valve is abnormal. Trivial mitral valve regurgitation. No  evidence of mitral stenosis. Moderate mitral annular calcification.   5. The aortic valve has been repaired/replaced. Aortic valve  regurgitation is trivial. There is a 26 mm Medtronic Evolut valve present  in the aortic position. Procedure Date: 10/26/2020. Echo findings are  consistent with perivalvular leak of the aortic  prosthesis. Aortic valve area, by VTI measures 2.05 cm. Aortic valve mean  gradient measures 11.0 mmHg. Aortic valve Vmax measures 2.44 m/s.   6. The inferior vena cava is normal in size with greater than 50%  respiratory variability, suggesting right atrial pressure of 3 mmHg.   Comparison(s): Changes from prior study are noted. 11/02/2020: LVEF 60-65%, TAVR with mean gradient of 9 mmHg, no perivalvular leak.    EKG:  EKG is NOT  ordered today.    Recent Labs: 11/01/2020: ALT 21; B Natriuretic Peptide 124.6 11/03/2020: Magnesium 2.1 11/11/2020: BUN 18; Creatinine, Ser 1.13; Hemoglobin 9.3; NT-Pro BNP 510; Platelets 296; Potassium 4.7; Sodium 141; TSH 5.820  Recent Lipid Panel    Component Value Date/Time   CHOL 97 08/11/2020 0840   TRIG 86 08/11/2020 0840   TRIG 90 12/01/2005 1330   HDL 32 (L) 08/11/2020 0840   CHOLHDL 3.0 08/11/2020 0840   VLDL 17 08/11/2020 0840   LDLCALC 48 08/11/2020 0840     Risk Assessment/Calculations:    CHA2DS2-VASc Score = 8   This indicates a 10.8% annual risk of stroke. The patient's score is based upon: CHF History: 0 HTN History: 1 Diabetes History: 1 Stroke History: 2 Vascular Disease History: 1 Age Score: 2 Gender Score: 1      Physical Exam:    VS:  BP (!) 144/62   Pulse 60   Ht $R'5\' 1"'Nd$  (1.549 m)   Wt 191 lb 4.8 oz (86.8 kg)   SpO2 98%   BMI 36.15 kg/m     Wt Readings from Last 3 Encounters:  11/25/20 191 lb 4.8 oz (86.8 kg)  11/16/20 193 lb (87.5 kg)  11/11/20 191 lb 12.8 oz (87 kg)     GEN:  Well nourished, well developed in no acute distress, obese HEENT: Normal NECK: No JVD LYMPHATICS: No lymphadenopathy CARDIAC: RRR, 3/6 flow murmur. No rubs, gallops RESPIRATORY:  Clear to auscultation without rales, wheezing or rhonchi  ABDOMEN: Soft, non-tender, non-distended MUSCULOSKELETAL:  No edema; No deformity  SKIN: Warm and dry.   NEUROLOGIC:  Alert and oriented x 3 PSYCHIATRIC:  Normal affect   ASSESSMENT:    1. S/P TAVR (transcatheter aortic valve replacement)   2. Anemia, unspecified type   3. Paroxysmal atrial fibrillation (HCC)   4. CHB (complete heart block) (HCC)   5. Essential hypertension   6. Chronic diastolic heart failure ([place])   7. Anxiety     PLAN:    In order of problems listed above:  Severe AS s/p TAVR: echo today showed EF 55-60%, normally functioning TAVR with a mean gradient of 11 mm hg and no PVL. There is an  "oscillating density seen in the RV, inferior to the TV (images 45-46), suspect calcified chordal apparatus or less likely' calcified thrombus/vegetaton -clinical correlation advised." Will discuss with team. She has NYHA class II symptoms. SBE prophylaxis discussed; the patient is edentulous and does not go to the dentist. Continue Eliquis. I  will see her back in 1 year with an echo.    Chronic anemia 2/2 GI bleeding: EDG 9/15 showed gastritis with no signs of active bleeding but stigmata of recent bleeding.  It was suspected that the patient's anemia and melena was due to bleeding from gastric erosions in the setting of Eliquis use. Colonoscopy was deferred given severe AS and risk with sedation. Continue PPI.    PAF: sounds regular on exam today. 16% AF burden on Zio AT. Continue amio $RemoveBefor'200mg'eHmJXycIKJvj$  daily and Toprol Xl 12.$RemoveBefo'5mg'VxXgMWuNzNu$  daily. Continue Eliquis.    Transient complete heart block: occurred perioperatively. Zio did not show any HAVB.     HTN: BP borderline today. No changes made.    Chronic diastolic CHF: appears euvolemic. Continue Lasix $RemoveBefore'40mg'peocEMzdBsaFy$ / Kdur 40meq daily.    Pulmonary nodule: pre TAVR CT showed a 5 mm pulmonary nodule in the lateral segment of the right middle lobe. No follow-up needed if patient is low-risk. Non-contrast chest CT can be considered in 12 months if patient is high-risk. Not high risk, no follow up recommended.   Anxiety: encouraged her to talk to PCP about SSRI. Continue PRN xanax .   Medication Adjustments/Labs and Tests Ordered: Current medicines are reviewed at length with the patient today.  Concerns regarding medicines are outlined above.  No orders of the defined types were placed in this encounter.   No orders of the defined types were placed in this encounter.   Patient Instructions  Medication Instructions:  Your physician recommends that you continue on your current medications as directed. Please refer to the Current Medication list given to you today.  *If  you need a refill on your cardiac medications before your next appointment, please call your pharmacy*   Lab Work: None ordered   If you have labs (blood work) drawn today and your tests are completely normal, you will receive your results only by: [place] (if you have MyChart) OR A paper copy in the mail If you have any lab test that is abnormal or we need to change your treatment, we will call you to review the results.   Testing/Procedures: None ordered    Follow-Up: None ordered   Other Instructions None     Signed, Angelena Form, PA-C  11/26/2020 2:08 PM    [facility]

## 2020-11-26 ENCOUNTER — Telehealth: Payer: Self-pay | Admitting: Internal Medicine

## 2020-11-26 NOTE — Telephone Encounter (Signed)
Judeth Cornfield says they received CMN form faxed from our office but the 2nd page of the form was messed up when fax came through  Would like for provider or nurse to refax [phone]

## 2020-11-26 NOTE — Telephone Encounter (Signed)
Orders given to [employer] rep in office

## 2020-11-28 ENCOUNTER — Encounter: Payer: Self-pay | Admitting: Cardiology

## 2020-11-28 DIAGNOSIS — Z79899 Other long term (current) drug therapy: Secondary | ICD-10-CM | POA: Insufficient documentation

## 2020-11-28 NOTE — Progress Notes (Signed)
Primary Care Provider: Biagio Borg, MD Cardiologist: Glenetta Hew, MD Electrophysiologist: Dr. Sallyanne Kuster TAVR Team: Drs. Burt Knack and [place] along with Dr. Cyndia Bent Clinic Note: Chief Complaint  Patient presents with   Follow-up    ~ annual - but lots have happened since last visit -- CVA, Afib, TAVR    ===================================  ASSESSMENT/PLAN   Problem List Items Addressed This Visit       Cardiology Problems   Severe aortic stenosis (Chronic)    She had pretty rapid progression from the last year.  The plan had been for her to have a echocardiogram done about a month after when she actually presented with her A. fib and stroke.  Now status post TAVR with well-seated valve.      Relevant Medications   losartan (COZAAR) 50 MG tablet   Other Relevant Orders   Pulmonary Function Test   Thyroid Function Panel (THS+T3+T4+Free)   C-reactive protein   Sedimentation rate   Comprehensive metabolic panel   Hyperlipidemia with target LDL less than 70 (Chronic)    Stable LDL of 48 on current dose of atorvastatin 40 mg.  Tolerating well.  No issues.      Relevant Medications   losartan (COZAAR) 50 MG tablet   Essential hypertension (Chronic)    Has been hovering around normal values.  Has had some ups and downs.  Plan: Increase losartan to 50 mg daily.  If she continues to have bradycardia, may take her off Toprol while she is on amiodarone.      Relevant Medications   losartan (COZAAR) 50 MG tablet   PAF with RVR: CHA2DS2-VASc 9.  On Eliquis and amiodarone. (Chronic)    Now on amiodarone and Eliquis.  Seems to be rhythm controlled with no actual bradycardia episodes either.  As results of the low heart rates, she is only on very minimal dose Toprol.  She needs baseline labs for her amiodarone monitoring. ->  LFTs and TFTs we will also check CRP and ESR as inflammatory markers of possible pulmonary toxicity.  I would like for her to be little more stable  postprocedure before checking PFTs.      Relevant Medications   losartan (COZAAR) 50 MG tablet   Other Relevant Orders   Thyroid Function Panel (THS+T3+T4+Free)   C-reactive protein   Sedimentation rate   Comprehensive metabolic panel   CHF (congestive heart failure), NYHA class II, chronic, diastolic (HCC) (Chronic)    Stable class II symptoms.  Mild orthopnea and exertional dyspnea, but no edema.  She is on losartan 25 mg which we have increased to 50 mg. On low-dose Toprol but also on amlodipine for additional afterload reduction and blood pressure control. She is on standing Lasix 40 mg daily-and not requiring that any additional doses as needed.      Relevant Medications   losartan (COZAAR) 50 MG tablet   Other Relevant Orders   Pulmonary Function Test   Thyroid Function Panel (THS+T3+T4+Free)   C-reactive protein   Sedimentation rate   Comprehensive metabolic panel   Coronary artery disease, non-occlusive (Chronic)    Elevated coronary calcium score with no evidence of CAD or coronary CTA as well as her coronary angiography.  Because of Eliquis she is not on aspirin.  She is on statin low-dose beta-blocker ARB and amlodipine.      Relevant Medications   losartan (COZAAR) 50 MG tablet   Other Relevant Orders   Pulmonary Function Test   Thyroid Function Panel (  THS+T3+T4+Free)   C-reactive protein   Sedimentation rate   Comprehensive metabolic panel   Transient post TAVR CHB -> not seen on follow-up Zio patch (Chronic)    No evidence of block or bradycardia on monitor.      Relevant Medications   losartan (COZAAR) 50 MG tablet   Other Relevant Orders   Pulmonary Function Test   Thyroid Function Panel (THS+T3+T4+Free)   C-reactive protein   Sedimentation rate   Comprehensive metabolic panel     Other   History of CVA (cerebrovascular accident) (Chronic)   S/P TAVR (transcatheter aortic valve replacement) - Primary (Chronic)   Relevant Orders   Thyroid Function  Panel (THS+T3+T4+Free)   C-reactive protein   Sedimentation rate   Comprehensive metabolic panel   Current use of long term anticoagulation - for Afib; CHA2DS2-VASc 9, on Eliquis (Chronic)    Pretty significant CHA2DS2-VASc score, stable on Eliquis.  I suspect that the A. fib was a manifestation of symptomatic aortic stenosis.  Thankfully, her stroke symptoms have improved some.  Just some balance issues.      Relevant Orders   C-reactive protein   Sedimentation rate   Comprehensive metabolic panel   Long term current use of amiodarone for rate-rhythm control of A. fib (Chronic)    Remains on amiodarone.  Needs routine surveillance labs checked-TSH 0 with full thyroid panel, ESR, CRP.  Will need annual eye exams.  We will also order PFTs at next follow-up visit.      Relevant Orders   Pulmonary Function Test   Thyroid Function Panel (THS+T3+T4+Free)   C-reactive protein   Sedimentation rate   Comprehensive metabolic panel    ===================================  HPI:    Mary Mccarthy is a 80 y.o. female with a complicated PMH Notable for Progression to Severe Aortic Stenosis Now Status Post TAVR, nonobstructive CAD, recent diagnosis of A. fib (intermittent heart block post TAVR), HTN, HLD, DM-2, colonic AVM with recent GI bleed secondary to gastritis), and Obesity who presents today for first visit with Primary Cardiologist following diagnosis of A. fib and TAVR with multiple hospitalizations in between..  When I last saw Mary Mccarthy is via telemedicine in November 2021.  We had just done a follow-up echocardiogram showing pretty much borderline severe aortic stenosis with a mean gradient of 34 mmHg.  She had a Coronary CT AngiogramRevealing Coronary Calcium Score of 624 with nonobstructive CAD (mild LAD and RCA 25 from 49%, and 50 to 69% LCx).  She tells me she is doing fairly well, not having any chest pains or really significant exertional dyspnea.  She is not very active.   Occasional edema.  Normal blood pressures when she checks at home, but elevated in doctors offices. ->  Plan was to follow-up echo in August 2022.  She did relatively well until August 10, 2020 -> presented to ER with palpitations, chest pressure and slurred speech.  Difficulty moving legs began on 08/08/2020 next morning felt malaise dyspnea with worsening exertional dyspnea.  Waxing waning chest pain.  On 08/09/2020 noted slurred speech, muffled speech, EMS called.  Neurologic symptoms were resolving upon arrival to ER.  Was noted to be in A. fib RVR and found to have acute CVA (shower of emboli: Small infarcts in the high right posterior frontal white matter, left caudate, right thalamus and bilateral cerebral hemispheres); spontaneously converted with IV diltiazem. ->  Residual left foot weakness -??  For some reason, despite clearly having A. fib RVR on EKG, stroke team suggested  a loop recorder (completely unnecessary -> DC'd on Eliquis (CHA2DS2-VASc score 8-9), Coreg along with irbesartan.  => Referred to A. fib clinic   Seen in A. fib clinic on 08/23/2020: Denied breakthrough episodes of palpitations chest pain or dyspnea.  No bleeding issues on Eliquis. ->  Aspirin was discontinued, -> referred to Dr. Sallyanne Kuster for ILR placement.  Seen by Dr. Sallyanne Kuster on 09/06/2020 to discuss ILR placement.  He correctly indicated that with documented A. fib, ILR not indicated.  There was concern that carvedilol was stopped so he restarted. But was not noted was that the she had echocardiogram during her stroke hospitalization now showing Progression of Aortic Stenosis to Severe (Something Not Noted during Hospitalization). -> Because of her now sedentary lifestyle post stroke, she was referred for TAVR evaluation because of concern for A. fib being a harbinger of progression to severe phase of AS. => In order to conserve time, I recommended that she be referred directly to TAVR clinic for evaluation.  Right left heart  catheterization was scheduled. => Unfortunately, she did not make it to this scheduled procedure and TAVR consult prior to being hospitalized for heart failure.  She apparently rescheduled her appointment due to feeling poorly prior to admission.  Recent Hospitalizations:  August 10, 2020-admitted for stroke, A. fib RVR (see above) 8/22-26/2022: admitted with chest pain and dyspnea -> felt as though she may have the flu.  PCP called in doxycycline and Tessalon Perles as well as OTC Mucinex.  Felt better after a week but then began to feel worse again.  Felt "volume overloaded and short of breath.  Woke up with chest pain and heaviness prior to present to the ER via EMS after calling 911. Noted to be hypoxic with increased work of breath, BNP of ~300.  Blood pressure 216/70 (had not taken a.m. meds) placed on CPAP, IV diuresis. Monitor showed sinus rhythm with runs of SVT felt to be worsening aortic stenosis, no evidence of recurrent A. fib. Seen for inpatient consultation by Dr. Burt Knack -> unfortunately, with recent stroke, not felt to be an urgent TAVR candidate however the pre-TAVR work-up was started with right and left heart cath once she was deemed euvolemic.  Eventually placed on amiodarone for rhythm control of A. fib.  This is in conjunction with carvedilol for rate control and Eliquis.  Amiodarone converted to oral on discharge. Scheduled to see Dr. Cyndia Bent on 10/20/2020 to complete TAVR evaluation; she noted symptomatic improvement with transfusion.  Also converted to sinus rhythm.  10/19/2020 -> admitted 9/14-22/2022: Noted feeling unable to catch her breath as well as lower extremity swelling.  Also noted darkening stool since her discharge.  This is being contributed antibiotics.  Also noted abdominal pain and cramping with nausea.  Despite instructions to the contrary, she was still on Eliquis and aspirin and took additional doses of ibuprofen for headache. Hemoglobin level is 7.6 on admission. =  GI consulted for EGD> noted normal esophagus and duodenum with gastritis/gastric erosion likely related to NSAIDs coupled with Eliquis. => Recommended twice daily PPI for 8 weeks.  Restarted Eliquis. Rebolus with IV amiodarone for A. fib RVR.  Converted with transfusion and amiodarone. Inpatient consult with Dr. Cyndia Bent -> TAVR 10/26/20 -> beta-blocker Held because of concern for complete heart block. ->  Discharged on ARB, Norvasc and hydralazine along with Amiodarone, Eliquis and 40 mg daily Lasix..  9/26-29/2022: Presented with nausea vomiting, unable to tolerate p.o.  No chest pain pressure or dyspnea.  Feeling very  anxious and jittery.  Noted to be in mild AKI with creatinine at 1.52.  She is admitted for IV hydration. => Creatinine returned to baseline 1.01 prior discharge.   Apparently, she had had significant constipation during the index hospitalization for TAVR and took some laxatives to help with BM.  This then led to loose stools nausea and vomiting.  Thankfully, no melena or hematochezia. Telemetry again showed intermittent A. fib RVR with rates up to 130s Mild troponin elevation thought to be related to demand ischemia.Echo showed normal THV function. Toprol started on discharge due to intermittent A. fib.  Amlodipine increased to 10 mg daily, hydralazine was not restarted.  [name] has followed up twice with Angelena Form, PA-C from the TAVR team.  She noted that she was having significant anxiety as well as dyspnea upon discharge.  Follow-up labs showed elevated BNP of 510. ->  By the time she was seen in initial follow-up on 11/25/2020, she still noted some orthopnea and exertional dyspnea but no chest pain or pressure.  No PND.  Mostly dealing with anxiety and panic attacks. ->  Feeling lonely.  Using as needed Xanax. She was last seen on 11/25/2020: She is feeling a lot better.  Still had some mild orthopnea and exertional dyspnea but no chest pain.  No dizziness or syncope.  No  edema or PND. => NYHA class II symptoms.  Still having panic and anxiety. SBE prophylaxis discussed. Pulmonary nodule noted on chest CT.  Consider noncontrast CT in 12 months. Recommended discussing management of anxiety with PCP.  Recommended SSRI.  Reviewed  CV studies:    The following studies were reviewed today: (if available, images/films reviewed: From Epic Chart or Care Everywhere) TTE 08/27/2020: Severe Calcific Aortic Stenosis (VTI AVA estimated 0.86 cm, mean gradient 42 mmHg, V-max 4.36 m/s).  EF 55 to 60%.  Severe concentric LVH.  Unable to determine diastolic parameters.  Moderately elevated PAP.  Mild LA dilation.  Mild circumferential pericardial effusion.  Trivial MR.  Severe MAC.  09/29/2020 R&LHC: pRCA 20%, m-dRCA 30%. D LM-pLAD 20%. RI 30%, mLAD 20%.  Severe AS with mean gradient measured at 52.8 mmHg. ->  Plan continued outpatient TAVR work-up  10/26/2020: TAVR -> Medtronic Evolut-Pro + (size 26 mm, model # EVPROPLUS -26US, serial # [id number]); transient high-grade AV block noted so PPM left in place. Limited TTE: EF 60 to 65%.  Degenerative severe MAC with mild MR.  Pre-TAVR well-visualized calcified AoV mean gradient 37 mmHg, AVA 0.72 cm Post TAVR well-positioned supra-annular 26 mm Medtronic Evolut Pro valve placed with no PVL.  Mean gradient 14 mmHg.  AVR 1.58 cm.  Normal flow to RCA and LM-LAD. Day 1 postop echo 10/27/2020: EF 60 to 65%.  Normal LV function.  Mild LVH.  Severe LA dilation.  Mild RA dilation.  Severe MAC.  Well-Positioned Supra Annular Medtronic Evolut Pro THP.  Mean gradient 12 mmHg, peak 24 mmHg.  AVA 1.8 cm.  Trivial PVL Follow-up TTE 11/02/2020: Normal structure and function of the aortic valve prosthesis.  Mean gradient 9 mmHg. 1 month postop TTE 11/25/2020: EF 55 to 60%.  No R WMA.  GR 1 DD.  Elevated LVEDP.  Normal RV size with mildly elevated RVP estimated 44 mmHg.  Oscillating density in the RV suspect calcified chordal apparatus versus calcified  thrombus.  Moderate LA dilation.  Normal IVC/RA P => Well-positioned 26 mm Medtronic Evolut Pro THVwith mean AOV gradient 11 mmHg.  Trivial PVL  Zio patch: 10/26/2020  noted episode of A. fib (variable rates).  Patient felt well.  No missed Eliquis. Predominantly sinus rhythm with minimum heart rate of 49 bpm, maximum 114 bpm. Average 68 bpm. Total of 206 (Nonsustained Ventricular Tachycardia-V. tach fastest 5 beats continue 239 bpm. Longest 9.3 seconds average rate 135 bpm.  Runs of V. tach could be A. fib with aberrancy. A. fib burden (16%) rate range 62-139 bpm with average rate 105 bpm. Longest was for 17 hr 55 min. Some aberrant conduction suspected. Rare isolated PACs and PVCs. Some PVC couplets and triplets were noted. No high-grade AV block noted.  Interval History:   Javaeh Muscatello presents today for follow-up overall stating that she is gradually starting to feel better.  No major concerns her level of anxiety and stress.  She has these panic attacks where her heart rate goes up and she gets short of breath and dizzy.  They are less frequent, but still occurring several times a day.  They happen too frequently for her to take Xanax as often as she is having them.  She also said that Xanax was not helping very much.  She was started on Remeron and said that she is not taking anymore because of headache.  Thankfully, from a cardiac standpoint, she is doing pretty well.  She has not had any chest pain or pressure with rest or exertion.  She still has some mild orthopnea although it is getting better.  She really has off-and-on lower extremity edema she is taking extra Lasix twice in the last couple weeks.  She has baseline dyspnea but that is also notably improved since prior to TAVR.  She has poor balance and is using a cane for short distances but occasionally use a walker for longer distances..  She says that if it was not for her panic attacks, she would be happy with how she is done since  her procedure.  No further heart failure symptoms.  CV Review of Symptoms (Summary) Cardiovascular ROS: positive for - dyspnea on exertion, orthopnea, and fast heart rate/dizziness associated with panic attacks. negative for - chest pain, irregular heartbeat, palpitations, paroxysmal nocturnal dyspnea, shortness of breath, or lightheadedness, dizziness or weakness, syncope/near syncope or TIA/amaurosis fugax, claudication,  REVIEWED OF SYSTEMS   Review of Systems  Constitutional:  Positive for malaise/fatigue (Gradually starting to get her energy level back, but just not sleeping well because of anxiety). Negative for weight loss.  HENT:  Negative for congestion and nosebleeds.   Respiratory:  Positive for shortness of breath (Exertional per HPI). Negative for cough.   Cardiovascular:  Positive for leg swelling (Stable, controlled.  Minimal additional Lasix.).  Gastrointestinal:  Negative for blood in stool and melena.  Genitourinary:  Negative for dysuria and hematuria.  Musculoskeletal:  Positive for joint pain.  Neurological:  Negative for dizziness (Unsteady gait, uses walker or cane.  Poor balance) and weakness.  Psychiatric/Behavioral:  Positive for depression. Negative for memory loss. The patient is nervous/anxious (Significant anxiety attacks). The patient does not have insomnia.    I have reviewed and (if needed) personally updated the patient's problem list, medications, allergies, past medical and surgical history, social and family history.   PAST MEDICAL HISTORY   Past Medical History:  Diagnosis Date   ALLERGIC RHINITIS 06/24/2007   Anemia    AVM (arteriovenous malformation) of colon    Blood transfusion without reported diagnosis 05/2015   COLONIC POLYPS, HX OF 06/24/2007   Coronary artery disease, non-occlusive 05/2015   Trop +  w/ Acute Anemia =>CATH: small RI - Ostial 60%, ostial RCA 30% and dLAD 40-50%;; 9/'21: Cor Ca Score 624. Mild (25-49%) prox RCA & LAD,;  Moderate (50-69%) Ostial Small RI & prox LCx.     DEPRESSION 10/08/2006   DIABETES MELLITUS, TYPE II 10/05/2006   GERD 06/24/2007   History of CVA (cerebrovascular accident) 08/10/2020   08/2020- found to be in afib with RVR, started on Eliquis (shower of emboli from A. fib as complication of severe AS)   HYPERLIPIDEMIA 10/08/2006   HYPERTENSION 10/08/2006   INSOMNIA 09/24/2008   Left knee DJD    Nonsustained paroxysmal ventricular tachycardia 10/2018   Post-TAVR Zio Patch: Frequent (206) runs of NSVT - Fastest 5 beats 239 bpm. Longest 9.3 Sec avg 135 bpm. (? possibly Afib w/ aberrancy)   Osteoporosis 03/10/2016   PAF (paroxysmal atrial fibrillation) (HCC)    PAF with RVR: CHA2DS2-VASc 9.  On Eliquis and amiodarone. 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 Angelena Form & Dr. Cyndia Bent   Severe aortic stenosis 08/2020   Progression from Mod-Severe AS to Severe AS - noted on Echo 08/2020 -> (mean gradient progressed from 34 to 42 mmHg) this was in setting of new onset A. fib RVR, acute diastolic HF and shower of emboli CVA. ->  Referred for TAVR, completed 10/26/2020   This patients CHA2DS2-VASc Score and unadjusted Ischemic Stroke Rate (% per year) is equal to 12.2 % stroke rate/year from a score of 9  Above score calculated as 1 point each if present [CHF, HTN, DM, Vascular=MI/PAD/Aortic Plaque, Age if 65-74, or Female] Above score calculated as 2 points each if present [Age > 75, or Stroke/TIA/TE]   PAST SURGICAL HISTORY   Past Surgical History:  Procedure Laterality Date   APPENDECTOMY     BIOPSY  10/21/2020   Procedure: BIOPSY;  Surgeon: Sharyn Creamer, MD;  Location: [facility];  Service: Gastroenterology;;   BREAST BIOPSY     CARDIAC CATHETERIZATION N/A 05/21/2015   Procedure: Left Heart Cath and Coronary Angiography;  Surgeon: Leonie Man,  MD;  Location: [place] [facility];  Service: Cardiovascular;: Ost RI 60%, Ost RCA 30%, dLAD tapers to small vessel w/ 40-50%. Mildly elevated LVEDP. Normal LV Fxn.   COLONOSCOPY N/A 05/20/2015   Procedure: COLONOSCOPY;  Surgeon: Manus Gunning, MD;  Location: [facility];  Service: Gastroenterology;  Laterality: N/A;   CORONARY CA2+ SCORE / CARDIAC CT ANGIOGRAM  10/09/2019   Calcium score 624.  82nd percentile. Dominant RCA: Mild (25-49%) proximal stenosis-distal bifurcation into PDA and PAV--< RPL branches.  LAD (1 major mid vessel diagonal) diffuse calcified plaque, mild proximal stenosis with minimal distal stenosis.  Small RI moderate ostial disease.  LCx-moderate mixed (50-69%) proximal stenosis.  Small dOM1 disease.  Trileaflet AoV, annular Ca2+ - probable AS   ESOPHAGOGASTRODUODENOSCOPY N/A 05/20/2015   Procedure: ESOPHAGOGASTRODUODENOSCOPY (EGD);  Surgeon: Manus Gunning, MD;  Location: [place];  Service: Gastroenterology;  Laterality: N/A;   ESOPHAGOGASTRODUODENOSCOPY (EGD) WITH PROPOFOL N/A 10/21/2020   Procedure: ESOPHAGOGASTRODUODENOSCOPY (EGD) WITH PROPOFOL;  Surgeon: Sharyn Creamer, MD;  Location: [facility];  Service: Gastroenterology;  Laterality: N/A;   INTRAOPERATIVE TRANSTHORACIC ECHOCARDIOGRAM N/A 10/26/2020   Procedure: INTRAOPERATIVE TRANSTHORACIC ECHOCARDIOGRAM;  Surgeon: Angelena Form; Location: [facility]; Pre-TAVR well-visualized calcified AoV mean Grad 37 mmHg, AVA 0.72 cm => Post TAVR well-positioned supra-annular 26 mm  Medtronic Evolut Pro valve placed with no PVL.  Mean gradient 14 mmHg.  AVR 1.58 cm.  Normal flow to RCA and LM-LAD.  ; EF 60 to 65%.  Degenerative severe MAC w/ mild MR.   RIGHT/LEFT HEART CATH AND CORONARY ANGIOGRAPHY N/A 09/29/2020   Procedure: RIGHT/LEFT HEART CATH AND CORONARY ANGIOGRAPHY;  Surgeon: Burnell Blanks, MD;  Location: [place];  Service: Cardiovascular; pre-TAVR:  pRCA 20%, m-dRCA 30%. D LM-pLAD 20%. RI 30%,  mLAD 20%.  Severe AS with mean gradient measured at 52.8 mmHg.   TONSILLECTOMY     TRANSCATHETER AORTIC VALVE REPLACEMENT, TRANSFEMORAL N/A 10/26/2020   Procedure: TRANSCATHETER AORTIC VALVE REPLACEMENT, TRANSFEMORAL;  Surgeon: Burnell Blanks, MD;  Location: [facility];  Service: Open Heart Surgery;; Medtronic Evolut-Pro + (size 26 mm, model # EVPROPLUS -26US, serial # [id number]); transient high-grade AV block noted so PPM left in place.   TRANSTHORACIC ECHOCARDIOGRAM  03/17/2019   a) 03/2019: EF 60 to 65%.  Moderate LVH.  GRII DD.  Mod-Severe AS (m grad 36 mmHg, peak 59 mmHg); b) 09/2019: EF 65-70%, No RWMA. Gr1 DD/hi LAP, Mild hi PAP. Mod LA Dil. MOD AS (mean Grad 34.5 mmHg).  STABLE   TRANSTHORACIC ECHOCARDIOGRAM  08/27/2020   Admitted for CVA/Afib RVR & CHF (Progression to SEVERE SYMPTOMATIC AS):  Severe Calcific Aortic Stenosis (VTI AVA estimated 0.86 cm, mean gradient 42 mmHg, V-max 4.36 m/s).  EF 55 to 60%.  Severe concentric LVH.  Unable to determine diastolic parameters.  Moderately elevated PAP.  Mild LA dilation.  Mild circumferential pericardial effusion.  Trivial MR.  Severe MAC.   TRANSTHORACIC ECHOCARDIOGRAM  11/25/2020   1 Month s/p TAVR: EF 55 to 60%.  No R WMA.  GR 1 DD.  Elevated LVEDP.  Normal RV size with mildly elevated RVP estimated 44 mmHg.  Oscillating density in the RV suspect calcified chordal apparatus versus calcified thrombus.  Moderate LA dilation.  Normal IVC/RA P => Well-positioned 26 mm Medtronic Evolut Pro THVwith mean AOV gradient 11 mmHg.  Trivial PVL   TRANSTHORACIC ECHOCARDIOGRAM  11/02/2020   a) Day 1 Post-op TAVR 10/27/20:  Well-Positioned Supra Annular Medtronic Evolut Pro THP.  Mean gradient 12 mmHg, peak 24 mmHg.  AVA 1.8 cm.  Trivial PVL.  EF 60 to 65%.  Normal LV function.  Mild LVH.  Severe LA dilation.  Mild RA dilation.  Severe MAC;; b) 11/02/20: Normal structure and function of the aortic valve prosthesis.  Mean gradient 9 mmHg (otherwise stable)   TUBAL  LIGATION      Immunization History  Administered Date(s) Administered   Fluad Quad(high Dose 65+) 12/26/2018, 12/06/2019, 10/13/2020   Influenza Split 12/21/2010, 12/28/2011   Influenza Whole 12/01/2005, 03/26/2009, 12/07/2009   Influenza, High Dose Seasonal PF 11/19/2012, 11/18/2013, 02/18/2015, 12/28/2015, 12/07/2016, 10/17/2017   PFIZER(Purple Top)SARS-COV-2 Vaccination 05/01/2019, 05/26/2019   Pneumococcal Conjugate-13 12/03/2012   Pneumococcal Polysaccharide-23 11/06/2005   Td 09/24/2008   Tdap 09/13/2018, 09/13/2018    MEDICATIONS/ALLERGIES   Current Meds  Medication Sig   acetaminophen (TYLENOL) 500 MG tablet Take 500 mg by mouth every 6 (six) hours as needed for moderate pain.   amiodarone (PACERONE) 200 MG tablet Take 1 tablet (200 mg total) by mouth daily.   amLODipine (NORVASC) 10 MG tablet Take 1 tablet (10 mg total) by mouth daily.   apixaban (ELIQUIS) 5 MG TABS tablet Take 1 tablet (5 mg total) by mouth 2 (two) times daily.   atorvastatin (LIPITOR) 40 MG tablet TAKE  1 TABLET BY MOUTH EVERY DAY   blood glucose meter kit and supplies Dispense based on patient and insurance preference. Use up to four times daily as directed. E11.9   Blood Glucose Monitoring Suppl (ONE TOUCH ULTRA 2) w/Device KIT Use as directed daily   cyanocobalamin ([employer] VITAMIN B12) 1000 MCG tablet TAKE 1 TABLET BY MOUTH EVERY DAY   furosemide (LASIX) 40 MG tablet Take 1 tablet (40 mg total) by mouth daily.   Lancets (ONETOUCH DELICA PLUS [id number]) MISC USE AS DIRECTED TEST 1-2 TIMES A DAY   losartan (COZAAR) 25 MG tablet Take 1 tablet (25 mg total) by mouth at bedtime.   metFORMIN (GLUCOPHAGE) 1000 MG tablet TAKE 1 TAB BY MOUTH IN THE AM,AND 1/2 TAB IN THE PM (Patient taking differently: TAKE 1/2 TAB(500 mg) BY MOUTH IN THE AM, AND 1/4 TAB(250 mg) IN THE PM)   metoprolol succinate (TOPROL-XL) 25 MG 24 hr tablet Take 0.5 tablets (12.5 mg total) by mouth daily.   mirtazapine (REMERON) 15 MG tablet Take 1  tablet (15 mg total) by mouth at bedtime.  Not takinh   ONETOUCH ULTRA test strip USE AS INSTRUCTED TEST 1-2 TIMES A DAY   pantoprazole (PROTONIX) 40 MG tablet Take 1 tablet (40 mg total) by mouth 2 (two) times daily.    Allergies  Allergen Reactions   Ibuprofen Other (See Comments)    Bleeding events    SOCIAL HISTORY/FAMILY HISTORY   Reviewed in Epic:  Pertinent findings:  Social History   Tobacco Use   Smoking status: Never   Smokeless tobacco: Never  Vaping Use   Vaping Use: Never used  Substance Use Topics   Alcohol use: No    Alcohol/week: 0.0 standard drinks   Drug use: No   Social History   Social History Narrative   Recently widowed--husband died in December 01, 2018.      Now lives alone but her son usually stays with her at night.  During the day she has 2 nephews who live nearby to come in and check on her intermittently.  Also one of her nieces calls routinely.      When her son is home and awake, she may try to walk on a treadmill, but is scared to walk outside.    OBJCTIVE -PE, EKG, labs   Wt Readings from Last 3 Encounters:  11/29/20 195 lb (88.5 kg)  11/25/20 191 lb 4.8 oz (86.8 kg)  2020-11-30 193 lb (87.5 kg)    Physical Exam: BP (!) 146/62   Pulse (!) 55   Ht _0  (1.549 m)   Wt 195 lb (88.5 kg)   SpO2 98%   BMI 36.84 kg/m  Physical Exam Vitals reviewed.  Constitutional:      General: She is not in acute distress.    Appearance: Normal appearance. She is obese. She is not ill-appearing or toxic-appearing.  HENT:     Head: Normocephalic and atraumatic.  Neck:     Vascular: No carotid bruit.  Cardiovascular:     Rate and Rhythm: Normal rate and regular rhythm. Occasional Extrasystoles are present.    Chest Wall: PMI is not displaced.     Pulses: Normal pulses.     Heart sounds: S1 normal and S2 normal. Heart sounds are distant. Murmur heard.  Harsh crescendo-decrescendo midsystolic murmur is present with a grade of 2/6 at the upper right  sternal border radiating to the neck.    No friction rub. Gallop present.  Pulmonary:  Effort: Pulmonary effort is normal. No respiratory distress.     Breath sounds: Normal breath sounds. No wheezing, rhonchi or rales.  Musculoskeletal:        General: Swelling (trivial) present.     Cervical back: Normal range of motion and neck supple.  Neurological:     General: No focal deficit present.     Mental Status: She is alert and oriented to person, place, and time.     Gait: Gait abnormal.  Psychiatric:        Behavior: Behavior normal.        Thought Content: Thought content normal.        Judgment: Judgment normal.     Comments: Notably anxious -- ? PTSD     Adult ECG Report N/a  Recent Labs:  reviewed   Lab Results  Component Value Date   CHOL 97 08/11/2020   HDL 32 (L) 08/11/2020   LDLCALC 48 08/11/2020   TRIG 86 08/11/2020   CHOLHDL 3.0 08/11/2020   Lab Results  Component Value Date   CREATININE 1.13 (H) 11/11/2020   BUN 18 11/11/2020   NA 141 11/11/2020   K 4.7 11/11/2020   CL 104 11/11/2020   CO2 20 11/11/2020   CBC Latest Ref Rng & Units 11/11/2020 11/03/2020 11/02/2020  WBC 3.4 - 10.8 x10E3/uL 4.6 6.7 4.7  Hemoglobin 11.1 - 15.9 g/dL 9.3(L) 8.1(L) 8.0(L)  Hematocrit 34.0 - 46.6 % 28.0(L) 26.0(L) 24.6(L)  Platelets 150 - 450 x10E3/uL 296 219 201    Lab Results  Component Value Date   HGBA1C 5.8 (H) 10/26/2020   Lab Results  Component Value Date   TSH 5.820 (H) 11/11/2020    ==================================================  COVID-19 Education: The signs and symptoms of COVID-19 were discussed with the patient and how to seek care for testing (follow up with PCP or arrange E-visit).    I spent a total of 38 minutes with the patient spent in direct patient consultation.  Additional time spent with chart review  / charting (studies, outside notes, etc): 21 min --> 75 min precharting --> multiple hospitalizations, clinic visits, procedures etc. were  reviewed.  Personally reviewed And echo films.  Past medical history and surgical history updated. Total Time: 134 min  Current medicines are reviewed at length with the patient today.  (+/- concerns) n/a  This visit occurred during the SARS-CoV-2 public health emergency.  Safety protocols were in place, including screening questions prior to the visit, additional usage of staff PPE, and extensive cleaning of exam room while observing appropriate contact time as indicated for disinfecting solutions.  Notice: This dictation was prepared with Dragon dictation along with smart phrase technology. Any transcriptional errors that result from this process are unintentional and may not be corrected upon review.  Patient Instructions / Medication Changes & Studies & Tests Ordered   Patient Instructions  Medication Instructions:    Increase losartan  50 mg  daily     *If you need a refill on your cardiac medications before your next appointment, please call your pharmacy*   Other Instructions  Contact your primary and discuss about talking with a psychotherapist   Lab Work:  Late NOV  or Dec 2022 CMP Thyroid panel CRP Sed ratre  If you have labs (blood work) drawn today and your tests are completely normal, you will receive your results only by: [place] (if you have MyChart) OR A paper copy in the mail If you have any lab test that is  abnormal or we need to change your treatment, we will call you to review the results.   Testing/Procedures:  Will be schedule at [facility] - Feb 20223 Your physician has recommended that you have a pulmonary function test. Pulmonary Function Tests are a group of tests that measure how well air moves in and out of your lungs.    Follow-Up: At [facility], you and your health needs are our priority.  As part of our continuing mission to provide you with exceptional heart care, we have created designated Provider Care Teams.  These Care Teams  include your primary Cardiologist (physician) and Advanced Practice Providers (APPs -  Physician Assistants and Nurse Practitioners) who all work together to provide you with the care you need, when you need it.  We recommend signing up for the patient portal called "MyChart".  Sign up information is provided on this After Visit Summary.  MyChart is used to connect with patients for Virtual Visits (Telemedicine).  Patients are able to view lab/test results, encounter notes, upcoming appointments, etc.  Non-urgent messages can be sent to your provider as well.   To learn more about what you can do with MyChart, go to [email or web address].    Your next appointment:   5 month(s) march 2023  The format for your next appointment:   In Person  Provider:   Glenetta Hew, MD    Studies Ordered:   Orders Placed This Encounter  Procedures   Thyroid Function Panel (THS+T3+T4+Free)   C-reactive protein   Sedimentation rate   Comprehensive metabolic panel   Pulmonary Function Test      Glenetta Hew, M.D., M.S. Interventional Cardiologist   Pager # [phone] Phone # [phone] [address] [place], [place] [address]   Thank you for choosing Heartcare at [facility]!!

## 2020-11-29 ENCOUNTER — Ambulatory Visit: Payer: Medicare Other | Admitting: Cardiology

## 2020-11-29 ENCOUNTER — Encounter: Payer: Self-pay | Admitting: Cardiology

## 2020-11-29 ENCOUNTER — Other Ambulatory Visit: Payer: Self-pay

## 2020-11-29 VITALS — BP 146/62 | HR 55 | Ht 61.0 in | Wt 195.0 lb

## 2020-11-29 DIAGNOSIS — E785 Hyperlipidemia, unspecified: Secondary | ICD-10-CM | POA: Diagnosis not present

## 2020-11-29 DIAGNOSIS — I442 Atrioventricular block, complete: Secondary | ICD-10-CM

## 2020-11-29 DIAGNOSIS — I350 Nonrheumatic aortic (valve) stenosis: Secondary | ICD-10-CM

## 2020-11-29 DIAGNOSIS — Z952 Presence of prosthetic heart valve: Secondary | ICD-10-CM

## 2020-11-29 DIAGNOSIS — I10 Essential (primary) hypertension: Secondary | ICD-10-CM | POA: Diagnosis not present

## 2020-11-29 DIAGNOSIS — Z79899 Other long term (current) drug therapy: Secondary | ICD-10-CM

## 2020-11-29 DIAGNOSIS — I480 Paroxysmal atrial fibrillation: Secondary | ICD-10-CM

## 2020-11-29 DIAGNOSIS — I2510 Atherosclerotic heart disease of native coronary artery without angina pectoris: Secondary | ICD-10-CM | POA: Diagnosis not present

## 2020-11-29 DIAGNOSIS — Z8673 Personal history of transient ischemic attack (TIA), and cerebral infarction without residual deficits: Secondary | ICD-10-CM

## 2020-11-29 DIAGNOSIS — Z7901 Long term (current) use of anticoagulants: Secondary | ICD-10-CM

## 2020-11-29 DIAGNOSIS — I5032 Chronic diastolic (congestive) heart failure: Secondary | ICD-10-CM

## 2020-11-29 MED ORDER — LOSARTAN POTASSIUM 50 MG PO TABS: 50.0000 mg | ORAL_TABLET | Freq: Every day | ORAL | 3 refills | Status: DC

## 2020-11-29 NOTE — Patient Instructions (Addendum)
Medication Instructions:    Increase losartan  50 mg  daily     *If you need a refill on your cardiac medications before your next appointment, please call your pharmacy*   Other Instructions  Contact your primary and discuss about talking with a psychotherapist   Lab Work:  Late NOV  or Dec 2022 CMP Thyroid panel CRP Sed ratre  If you have labs (blood work) drawn today and your tests are completely normal, you will receive your results only by: MyChart Message (if you have MyChart) OR A paper copy in the mail If you have any lab test that is abnormal or we need to change your treatment, we will call you to review the results.   Testing/Procedures:  Will be schedule at [facility] - Feb 20223 Your physician has recommended that you have a pulmonary function test. Pulmonary Function Tests are a group of tests that measure how well air moves in and out of your lungs.    Follow-Up: At [facility], you and your health needs are our priority.  As part of our continuing mission to provide you with exceptional heart care, we have created designated Provider Care Teams.  These Care Teams include your primary Cardiologist (physician) and Advanced Practice Providers (APPs -  Physician Assistants and Nurse Practitioners) who all work together to provide you with the care you need, when you need it.  We recommend signing up for the patient portal called "MyChart".  Sign up information is provided on this After Visit Summary.  MyChart is used to connect with patients for Virtual Visits (Telemedicine).  Patients are able to view lab/test results, encounter notes, upcoming appointments, etc.  Non-urgent messages can be sent to your provider as well.   To learn more about what you can do with MyChart, go to [email or web address].    Your next appointment:   5 month(s) march 2023  The format for your next appointment:   In Person  Provider:   Bryan Lemma, MD

## 2020-12-01 ENCOUNTER — Ambulatory Visit: Payer: Medicare Other | Admitting: *Deleted

## 2020-12-01 DIAGNOSIS — E119 Type 2 diabetes mellitus without complications: Secondary | ICD-10-CM

## 2020-12-01 DIAGNOSIS — I700 Atherosclerosis of aorta: Secondary | ICD-10-CM

## 2020-12-01 DIAGNOSIS — F419 Anxiety disorder, unspecified: Secondary | ICD-10-CM

## 2020-12-01 MED ORDER — CITALOPRAM HYDROBROMIDE 10 MG PO TABS: 10.0000 mg | ORAL_TABLET | Freq: Every day | ORAL | 3 refills | Status: DC

## 2020-12-01 NOTE — Addendum Note (Signed)
Addended by: Corwin Levins on: 12/01/2020 02:13 PM   Modules accepted: Orders

## 2020-12-01 NOTE — Patient Instructions (Signed)
     Visit Information  PATIENT GOALS:  Goals Addressed               This Visit's Progress     Manage my emotions/anxiety (pt-stated)        Timeframe:  Long-Range Goal Priority:  High Start Date:    12/01/20                         Expected End Date:                  02/04/21     Follow Up Date 12/29/20    - begin personal counseling - call and visit an old friend - check out volunteer opportunities - join a support group - laugh; watch a funny movie or comedian - perform a random act of kindness - practice relaxation or meditation daily - talk about feelings with a friend, family or spiritual advisor - practice positive thinking and self-talk    Why is this important?   When you are stressed, down or upset, your body reacts too.  For example, your blood pressure may get higher; you may have a headache or stomachache.  When your emotions get the best of you, your body's ability to fight off cold and flu gets weak.  These steps will help you manage your emotions.     Notes:         The patient verbalized understanding of instructions, educational materials, and care plan provided today and declined offer to receive copy of patient instructions, educational materials, and care plan.   Telephone follow up appointment with care management team member scheduled for:12/29/20  Reece Levy MSW, LCSW Licensed Clinical Social Worker [facility] [place] [phone]

## 2020-12-01 NOTE — Chronic Care Management (AMB) (Signed)
Chronic Care Management    Clinical Social Work Note  12/01/2020 Name: Prakriti Carignan MRN: [id number] DOB: 08-28-1940  [name] is a 80 y.o. year old female who is a primary care patient of Jenny Reichmann, Hunt Oris, MD. The CCM team was consulted to assist the patient with chronic disease management and/or care coordination needs related to: [place] and Resources.   Engaged with patient by telephone for initial visit in response to provider referral for social work chronic care management and care coordination services.   Consent to Services:  The patient was given information about Chronic Care Management services, agreed to services, and gave verbal consent prior to initiation of services.  Please see initial visit note for detailed documentation.   Patient agreed to services and consent obtained.   Assessment: Review of patient past medical history, allergies, medications, and health status, including review of relevant consultants reports was performed today as part of a comprehensive evaluation and provision of chronic care management and care coordination services.     SDOH (Social Determinants of Health) assessments and interventions performed:    Advanced Directives Status: Not ready or willing to discuss.  CCM Care Plan  Allergies  Allergen Reactions   Ibuprofen Other (See Comments)    Bleeding events    Outpatient Encounter Medications as of 12/01/2020  Medication Sig   ALPRAZolam (XANAX) 0.25 MG tablet Take 0.25 mg by mouth 3 x daily with food.   acetaminophen (TYLENOL) 500 MG tablet Take 500 mg by mouth every 6 (six) hours as needed for moderate pain.   amiodarone (PACERONE) 200 MG tablet Take 1 tablet (200 mg total) by mouth daily.   amLODipine (NORVASC) 10 MG tablet Take 1 tablet (10 mg total) by mouth daily.   apixaban (ELIQUIS) 5 MG TABS tablet Take 1 tablet (5 mg total) by mouth 2 (two) times daily.   atorvastatin (LIPITOR) 40 MG tablet TAKE 1 TABLET  BY MOUTH EVERY DAY   blood glucose meter kit and supplies Dispense based on patient and insurance preference. Use up to four times daily as directed. E11.9   Blood Glucose Monitoring Suppl (ONE TOUCH ULTRA 2) w/Device KIT Use as directed daily   cyanocobalamin ([employer] VITAMIN B12) 1000 MCG tablet TAKE 1 TABLET BY MOUTH EVERY DAY   furosemide (LASIX) 40 MG tablet Take 1 tablet (40 mg total) by mouth daily.   Lancets (ONETOUCH DELICA PLUS [id number]) MISC USE AS DIRECTED TEST 1-2 TIMES A DAY   losartan (COZAAR) 50 MG tablet Take 1 tablet (50 mg total) by mouth daily.   metFORMIN (GLUCOPHAGE) 1000 MG tablet TAKE 1 TAB BY MOUTH IN THE AM,AND 1/2 TAB IN THE PM (Patient taking differently: TAKE 1/2 TAB(500 mg) BY MOUTH IN THE AM, AND 1/4 TAB(250 mg) IN THE PM)   metoprolol succinate (TOPROL-XL) 25 MG 24 hr tablet Take 0.5 tablets (12.5 mg total) by mouth daily.   ONETOUCH ULTRA test strip USE AS INSTRUCTED TEST 1-2 TIMES A DAY   pantoprazole (PROTONIX) 40 MG tablet Take 1 tablet (40 mg total) by mouth 2 (two) times daily.   potassium chloride SA (KLOR-CON) 20 MEQ tablet Take 1 tablet (20 mEq total) by mouth daily. (Patient not taking: Reported on 11/29/2020)   No facility-administered encounter medications on file as of 12/01/2020.    Patient Active Problem List   Diagnosis Date Noted   Long term current use of amiodarone for rate-rhythm control of A. fib 11/28/2020   Chronic respiratory failure ([place]) 11/22/2020  Dehydration 11/02/2020   Diabetes mellitus type 2 in obese ([place]) 11/02/2020   AKI (acute kidney injury) ([place]) 11/01/2020   Transient post TAVR CHB -> not seen on follow-up Zio patch 10/28/2020   S/P TAVR (transcatheter aortic valve replacement) 10/26/2020   Melena 10/20/2020   Symptomatic anemia 10/19/2020   Acute bronchitis 09/21/2020   Current use of long term anticoagulation - for Afib; CHA2DS2-VASc 9, on Eliquis 08/23/2020   PAF with RVR: CHA2DS2-VASc 9.  On Eliquis and amiodarone.  08/11/2020   History of CVA (cerebrovascular accident) 08/10/2020   Hypomagnesemia 08/10/2020   Severe aortic stenosis 08/2020   Aortic atherosclerosis ([place]) 03/18/2020   B12 deficiency 09/21/2019   Cough 03/14/2018   Wheezing 03/14/2018   Anxiety 09/11/2017   Osteoporosis 03/10/2016   Coronary artery disease, non-occlusive 05/24/2015   GI bleed    Pulmonary hypertension (HCC)    Chronic diastolic heart failure ([place]) 05/20/2015   Gastric ulceration    AVM (arteriovenous malformation) of colon    Demand ischemia of myocardium ([place])    Right knee pain 05/21/2014   Anemia, iron deficiency 05/20/2013   MENOPAUSAL DISORDER 12/07/2009   INSOMNIA 09/24/2008   ALLERGIC RHINITIS 06/24/2007   GERD 06/24/2007   COLONIC POLYPS, HX OF 06/24/2007   Hyperlipidemia with target LDL less than 70 10/08/2006   Depression 10/08/2006   Essential hypertension 10/08/2006   PEPTIC ULCER DISEASE 10/08/2006   Type 2 diabetes mellitus with hyperglycemia, without long-term current use of insulin ([place]) 10/05/2006   Morbid obesity ([place]) 10/05/2006    Conditions to be addressed/monitored: Anxiety; Mental Health Concerns   Care Plan : LCSW Plan of Care  Updates made by Deirdre Peer, LCSW since 12/01/2020 12:00 AM     Problem: Anxiety   Priority: High     Long-Range Goal: Reduce anxiety through support, services, medication and counseling   Start Date: 12/01/2020  Expected End Date: 02/04/2021  This Visit's Progress: On track  Priority: High  Note:   Current barriers:   Acute Mental Health needs related to anxiety with panic attacks Mental Health Concerns  Needs Support, Education, and Care Coordination in order to meet unmet mental health needs. Clinical Goal(s): demonstrate a reduction in symptoms related to :Anxiety   Clinical Interventions:   Pt reports new onset anxiety that began after her recent hospitalization. "I was told I have PTSS- post traumatic stress syndrome". Pt reports  having up to 4-5 panic attacks per day- she was given Xanax but has stopped taking it because it made her "jittery and drowsy".  Will ask PCP and Pharmacist to address this and possible alternative RX options.  Pt is agreeable to counseling and prefers it be tele-health as she is not driving at this time. Her son stays with her at night but she is alone during the day.  Pt agrees to counseling referral be placed with [facility]. Pt denies SI/HI- mild depression with low PHQ2 score.  Assessed patient's previous and current treatment, coping skills, support system and barriers to care  Depression screen reviewed  PHQ2/ PHQ9 completed Solution-Focused Strategies  Active listening / Reflection utilized  Problem [place] strategies reviewed Provided psychoeducation for mental health needs  Reviewed mental health medications with patient and discussed importance of compliance:  Participation in counseling encouraged  Discussed referral to [place] to assist with connecting to mental health provider ; Review various resources, discussed options and provided patient information about  Options for mental health treatment based on need and insurance  1:1 collaboration with primary care provider regarding development and update of comprehensive plan of care as evidenced by provider attestation and co-signature Inter-disciplinary care team collaboration (see longitudinal plan of care) Patient Goals/Self-Care Activities: Over the next 30 days Discuss medication options with PCP Receive outreach from [place] to get connected with a mental health provider.         Follow Up Plan: Appointment scheduled for SW follow up with client by phone on: 12/29/20      Eduard Clos MSW, [place] Licensed Clinical Social Worker [place] [phone]

## 2020-12-01 NOTE — Progress Notes (Signed)
Ok to let pt know, instead of the xanax, we can try celexa 10 mg per day - I will send

## 2020-12-02 ENCOUNTER — Telehealth: Payer: Self-pay

## 2020-12-02 NOTE — Telephone Encounter (Addendum)
  Patient has been made aware of the medication change and understood.  ----- Message from Corwin Levins, MD sent at 12/01/2020  2:13 PM EDT ----- Ok to let pt know, instead of the xanax, we can try celexa 10 qd which is much easier to take - I have sent the rx ----- Message ----- From: Buck Mam, LCSW Sent: 12/01/2020   2:02 PM EDT To: Corwin Levins, MD, Kathyrn Sheriff, RPH  Pt is not taking the XANAX bc it makes her feel "jittery and drowsy". Can you assess for alternative RX options with her? I will be linking her with a counselor. Thanks  Reece Levy MSW, LCSW Licensed Clinical Social Worker [facility] [place] [phone]

## 2020-12-06 ENCOUNTER — Telehealth: Payer: Self-pay | Admitting: Internal Medicine

## 2020-12-06 ENCOUNTER — Encounter: Payer: Self-pay | Admitting: Cardiology

## 2020-12-06 ENCOUNTER — Ambulatory Visit: Payer: Medicare Other | Admitting: *Deleted

## 2020-12-06 DIAGNOSIS — F32A Depression, unspecified: Secondary | ICD-10-CM

## 2020-12-06 DIAGNOSIS — I480 Paroxysmal atrial fibrillation: Secondary | ICD-10-CM

## 2020-12-06 DIAGNOSIS — E119 Type 2 diabetes mellitus without complications: Secondary | ICD-10-CM

## 2020-12-06 DIAGNOSIS — I5032 Chronic diastolic (congestive) heart failure: Secondary | ICD-10-CM

## 2020-12-06 DIAGNOSIS — I10 Essential (primary) hypertension: Secondary | ICD-10-CM | POA: Diagnosis not present

## 2020-12-06 DIAGNOSIS — E785 Hyperlipidemia, unspecified: Secondary | ICD-10-CM | POA: Diagnosis not present

## 2020-12-06 NOTE — Assessment & Plan Note (Signed)
Elevated coronary calcium score with no evidence of CAD or coronary CTA as well as her coronary angiography.  Because of Eliquis she is not on aspirin.  She is on statin low-dose beta-blocker ARB and amlodipine.

## 2020-12-06 NOTE — Assessment & Plan Note (Signed)
Stable class II symptoms.  Mild orthopnea and exertional dyspnea, but no edema.  She is on losartan 25 mg which we have increased to 50 mg. On low-dose Toprol but also on amlodipine for additional afterload reduction and blood pressure control. She is on standing Lasix 40 mg daily-and not requiring that any additional doses as needed.

## 2020-12-06 NOTE — Assessment & Plan Note (Signed)
Stable LDL of 48 on current dose of atorvastatin 40 mg.  Tolerating well.  No issues.

## 2020-12-06 NOTE — Assessment & Plan Note (Signed)
Has been hovering around normal values.  Has had some ups and downs.  Plan: Increase losartan to 50 mg daily.  If she continues to have bradycardia, may take her off Toprol while she is on amiodarone.

## 2020-12-06 NOTE — Chronic Care Management (AMB) (Signed)
Chronic Care Management   CCM RN Visit Note  12/06/2020 Name: Mary Mccarthy MRN: [id number] DOB: 02-Aug-1940  Subjective: Mary Mccarthy is a 80 y.o. year old female who is a primary care patient of Biagio Borg, MD. The care management team was consulted for assistance with disease management and care coordination needs.    Engaged with patient by telephone for follow up visit in response to provider referral for case management and/or care coordination services.   Consent to Services:  The patient was given information about Chronic Care Management services, agreed to services, and gave verbal consent prior to initiation of services.  Please see initial visit note for detailed documentation.  Patient agreed to services and verbal consent obtained.   Assessment: Review of patient past medical history, allergies, medications, health status, including review of consultants reports, laboratory and other test data, was performed as part of comprehensive evaluation and provision of chronic care management services.   CCM Care Plan Allergies  Allergen Reactions   Ibuprofen Other (See Comments)    Bleeding events   Outpatient Encounter Medications as of 12/06/2020  Medication Sig   acetaminophen (TYLENOL) 500 MG tablet Take 500 mg by mouth every 6 (six) hours as needed for moderate pain.   ALPRAZolam (XANAX) 0.25 MG tablet Take 0.25 mg by mouth 3 x daily with food.   amiodarone (PACERONE) 200 MG tablet Take 1 tablet (200 mg total) by mouth daily.   amLODipine (NORVASC) 10 MG tablet Take 1 tablet (10 mg total) by mouth daily.   apixaban (ELIQUIS) 5 MG TABS tablet Take 1 tablet (5 mg total) by mouth 2 (two) times daily.   atorvastatin (LIPITOR) 40 MG tablet TAKE 1 TABLET BY MOUTH EVERY DAY   blood glucose meter kit and supplies Dispense based on patient and insurance preference. Use up to four times daily as directed. E11.9   Blood Glucose Monitoring Suppl (ONE TOUCH ULTRA 2) w/Device KIT  Use as directed daily   citalopram (CELEXA) 10 MG tablet Take 1 tablet (10 mg total) by mouth daily.   cyanocobalamin ([employer] VITAMIN B12) 1000 MCG tablet TAKE 1 TABLET BY MOUTH EVERY DAY   furosemide (LASIX) 40 MG tablet Take 1 tablet (40 mg total) by mouth daily.   Lancets (ONETOUCH DELICA PLUS [id number]) MISC USE AS DIRECTED TEST 1-2 TIMES A DAY   losartan (COZAAR) 50 MG tablet Take 1 tablet (50 mg total) by mouth daily.   metFORMIN (GLUCOPHAGE) 1000 MG tablet TAKE 1 TAB BY MOUTH IN THE AM,AND 1/2 TAB IN THE PM (Patient taking differently: TAKE 1/2 TAB(500 mg) BY MOUTH IN THE AM, AND 1/4 TAB(250 mg) IN THE PM)   metoprolol succinate (TOPROL-XL) 25 MG 24 hr tablet Take 0.5 tablets (12.5 mg total) by mouth daily.   ONETOUCH ULTRA test strip USE AS INSTRUCTED TEST 1-2 TIMES A DAY   pantoprazole (PROTONIX) 40 MG tablet Take 1 tablet (40 mg total) by mouth 2 (two) times daily.   potassium chloride SA (KLOR-CON) 20 MEQ tablet Take 1 tablet (20 mEq total) by mouth daily. (Patient not taking: Reported on 11/29/2020)   No facility-administered encounter medications on file as of 12/06/2020.   Patient Active Problem List   Diagnosis Date Noted   Long term current use of amiodarone for rate-rhythm control of A. fib 11/28/2020   Chronic respiratory failure ([place]) 11/22/2020   Dehydration 11/02/2020   Diabetes mellitus type 2 in obese ([place]) 11/02/2020   AKI (acute kidney injury) ([place]) 11/01/2020  Transient post TAVR CHB -> not seen on follow-up Zio patch 10/28/2020   S/P TAVR (transcatheter aortic valve replacement) 10/26/2020   Melena 10/20/2020   Symptomatic anemia 10/19/2020   Acute bronchitis 09/21/2020   Current use of long term anticoagulation - for Afib; CHA2DS2-VASc 9, on Eliquis 08/23/2020   PAF with RVR: CHA2DS2-VASc 9.  On Eliquis and amiodarone. 08/11/2020   History of CVA (cerebrovascular accident) 08/10/2020   Hypomagnesemia 08/10/2020   Severe aortic stenosis 08/2020   Aortic  atherosclerosis ([place]) 03/18/2020   B12 deficiency 09/21/2019   Cough 03/14/2018   Wheezing 03/14/2018   Anxiety 09/11/2017   Osteoporosis 03/10/2016   Coronary artery disease, non-occlusive 05/24/2015   GI bleed    Pulmonary hypertension ([facility])    Chronic diastolic heart failure ([place]) 05/20/2015   Gastric ulceration    AVM (arteriovenous malformation) of colon    Demand ischemia of myocardium ([place])    Right knee pain 05/21/2014   Anemia, iron deficiency 05/20/2013   MENOPAUSAL DISORDER 12/07/2009   INSOMNIA 09/24/2008   ALLERGIC RHINITIS 06/24/2007   GERD 06/24/2007   COLONIC POLYPS, HX OF 06/24/2007   Hyperlipidemia with target LDL less than 70 10/08/2006   Depression 10/08/2006   Essential hypertension 10/08/2006   PEPTIC ULCER DISEASE 10/08/2006   Type 2 diabetes mellitus with hyperglycemia, without long-term current use of insulin ([place]) 10/05/2006   Morbid obesity ([place]) 10/05/2006   Conditions to be addressed/monitored:  CHF and DMII  Care Plan : RN Care Manager Plan of Care  Updates made by Knox Royalty, RN since 12/06/2020 12:00 AM     Problem: Chronic Disease Management Needs   Priority: High     Long-Range Goal: Development of plan of care for long term chronic disease management   Start Date: 11/17/2020  Expected End Date: 11/17/2021  Priority: High  Note:   Current Barriers:  Chronic Disease Management support and education needs related to CHF and DMII Lives with son- who works during daytime hours: alone during day, limited stamina to perform ADL's/ iADL's after multiple recent hospitalizations Confusion around multiple new medications, post- recent hospitalizations- 12/06/20- confirmed patient has spoken with CCM Pharmacy team 12/06/20: Patient has received home oxygen, however, she reports not using: states oxygenator is "too loud" and keeps her awake Multiple recent inpatient hospitalizations: 12/06/20: confirmed no new/ recent hospital or ED visits  since last discharge 11/04/20 August 22-26, 2022: CHF September 12-22, 2022: AF with RVR; CHF; TAVR September 26-29, 2022: N/V, AKI  RNCM Clinical Goal(s):  Patient will verbalize basic understanding of CHF and DMII disease process and self health management plan CHF; A-Fib; DMII  through collaboration with RN Care manager, provider, and care team.   Interventions: 1:1 collaboration with primary care provider regarding development and update of comprehensive plan of care as evidenced by provider attestation and co-signature Inter-disciplinary care team collaboration (see longitudinal plan of care) Evaluation of current treatment plan related to  self management and patient's adherence to plan as established by provider  Heart Failure Interventions:  (Status: Goal on Track (progressing): YES.) Basic overview and discussion of pathophysiology of Heart Failure reviewed; Provided education on low sodium diet; Discussed importance of daily weight and advised patient to weigh and record daily; Reviewed role of diuretics in prevention of fluid overload and management of heart failure; Discussed the importance of keeping all appointments with provider; Reviewed recent provider office visits 11/22/20- PCP and 11/25/20 and 11/29/20- Cardiology appointments Reviewed recent medication changes post-provider office visits:  States taking Losartan 50 mg QD (increased from 25 mg) States taking mirtazapine 15 mg QD- states helping her rest better at night Acknowledges that PCP re-ordered Celexa, however, she is not taking post- her report to CCM RN CM on 11/17/20 shared with PCP at that time: "made me feel sick;" states "I am not going to take it Reviewed recent weights at home: reports consistently between 188-192, with weight this morning of "188.2 lbs;" reports highest weight since last hospital discharge 199.8 lbs Reviewed previously provided education around fluid restrictions at home: reports she did  not receive specific instructions from cardiologist around fluid restrictions: again discussed need to balance fluid intake vs. avoid dehydration, in setting of diuretic therapy; continue monitoring daily weights/ breathing status at home, and to follow action plan for yellow CHF zone Confirmed patient tries to follow heart healthy, low salt diet; believe appetite is slowly improving; continues to decline assistance with applying for Meals on Wheels-- continues to state she would rather wait on her niece to do this for her  Confirms that she has obtained home oxygen, however, reports "I can't use it, it is too loud and keeps me up all night;" discussed with patient results as per PCP that her oxygen (SaO2) levels were low when home monitor was in place: discussed with her need to use home O2 as prescribed/ importance of maintaining O2 levels in setting of CHF: encouraged patient to contact [employer] company to report that her oxygenator is loud, and to see if they would address-- encouraged   her to keep home O2 and to use as advised.   Confirms she has phone number for [facility]-- she agrees to contact them today Discussed with patient recent conversation with CCM CSW: counseling referral placed with [facility]: patient has not yet heard from anyone at [facility], encouraged her to listen out for this call Confirms received flu vaccine for 2022-23 flu season "in September" Reviewed upcoming provider office visit with patient: 12/29/20- CCM CSW follow up telephone visit; 02/16/21- PCP  Diabetes:  (Status: Goal on Track (progressing): YES.) Lab Results  Component Value Date   HGBA1C 5.8 (H) 10/26/2020  Assessed patient's understanding of A1c goal: <7% Provided education to patient about basic DM disease process; Discussed plans with patient for ongoing care management follow up and provided patient with direct contact information for care management team;      Review of patient status, including  review of consultants reports, relevant laboratory and other test results, and medications completed;       Confirmed patient monitors/ records blood sugars at home BID: fasting and post-prandial Reviewed with patient recent blood sugars at home: reports consistent fasting blood sugar values between 120-165; post-prandial blood sugar values between "130-140" Discussed need for updated diabetic eye exam- reports has not had eye exam in "over 2 years;" discussed importance annual eye exam in setting of DM; patient reports "has been too sick;" encouraged to please make prompt eye exam Provided education/ discussed risks/ complications of DMII, in setting of cardiovascular co-morbidities  Patient Goals/Self-Care Activities: Patient will self administer medications as prescribed as evidenced by self report  Patient will attend all scheduled provider appointments as evidenced by clinician review of documented attendance to scheduled appointments and patient report Patient will call pharmacy for medication refills as evidenced by patient report and review of pharmacy fill history as appropriate Patient will call provider office for new concerns or questions as evidenced by review of documented incoming telephone call  notes and patient report Patient will continue to follow heart healthy, low salt, carbohydrate modified, low sugar diet, as evidenced by patient reporting during CCM RN CM follow up outreach Patient will continue to monitor and record fasting and afternoon blood sugars at home daily, as evidenced by review of same/ patient reporting during CCM RN CM follow up outreach Patient will continue to monitor and record daily weights at home, as evidenced by review of same/ patient reporting during CCM RN CM follow up outreach Patient will contact [facility] [employer] to discuss concerns with her home Oxygen machine being loud Patient will use home O2 as prescribed as evidenced by review of same/  patient reporting during CCM RN CM follow up outreach Patient will maintain contact with Clinical Social Worker and Pharmacist at Dr. Gwynn Burly office as evidenced by review of same/ patient reporting during CCM RN CM follow up outreach and collaboration with CCM team as indicated      Plan: Telephone follow up appointment with care management team member scheduled for:  Monday, December 20, 2020 at 11:30 am The patient has been provided with contact information for the care management team and has been advised to call with any health related questions or concerns  [name], RN, BSN, [facility] RN Care Coordination- [place] [phone]: direct office [phone]: mobile

## 2020-12-06 NOTE — Assessment & Plan Note (Signed)
Now on amiodarone and Eliquis.  Seems to be rhythm controlled with no actual bradycardia episodes either.  As results of the low heart rates, she is only on very minimal dose Toprol.  She needs baseline labs for her amiodarone monitoring. ->  LFTs and TFTs we will also check CRP and ESR as inflammatory markers of possible pulmonary toxicity.  I would like for her to be little more stable postprocedure before checking PFTs.

## 2020-12-06 NOTE — Assessment & Plan Note (Signed)
Pretty significant CHA2DS2-VASc score, stable on Eliquis.  I suspect that the A. fib was a manifestation of symptomatic aortic stenosis.  Thankfully, her stroke symptoms have improved some.  Just some balance issues.

## 2020-12-06 NOTE — Assessment & Plan Note (Signed)
No evidence of block or bradycardia on monitor.

## 2020-12-06 NOTE — Assessment & Plan Note (Signed)
Remains on amiodarone.  Needs routine surveillance labs checked-TSH 0 with full thyroid panel, ESR, CRP.  Will need annual eye exams.  We will also order PFTs at next follow-up visit.

## 2020-12-06 NOTE — Assessment & Plan Note (Signed)
She had pretty rapid progression from the last year.  The plan had been for her to have a echocardiogram done about a month after when she actually presented with her A. fib and stroke.  Now status post TAVR with well-seated valve.

## 2020-12-06 NOTE — Telephone Encounter (Signed)
LVM for pt to rtn my call to schedule AWV with [facility]. Please schedule AWV if pt calls the office  

## 2020-12-06 NOTE — Patient Instructions (Signed)
Visit Information  Mary Mccarthy, it was nice talking with you today.    I look forward to talking to you again for an update on Monday, December 20, 2020 at 11:30 am- please be listening out for my call that day.  I will call as close to 11:30 am as possible.   If you need to cancel or re-schedule our telephone visit, please call [phone] and one of our care guides will be happy to assist you.   I look forward to hearing about your progress.   Please don't hesitate to contact me if I can be of assistance to you before our next scheduled telephone appointment.   Caryl Pina, RN, BSN, CCRN [facility] RN Care Coordination- [facility] [phone]: direct office [phone]: mobile   PATIENT GOALS:  Goals Addressed             This Visit's Progress    Patient Self-Care Activities   On track    Timeframe:  Long-Range Goal Priority:  High Start Date:       11/17/20                      Expected End Date:   11/17/21                    Patient will self administer medications as prescribed   Patient will attend all scheduled provider appointments  Patient will call pharmacy for medication refills  Patient will call provider office for new concerns or questions  Patient will continue to follow heart healthy, low salt, carbohydrate modified, low sugar diet Patient will continue to monitor and record fasting and afternoon blood sugars at home daily Patient will continue to monitor and record daily weights at home: you reported a weight this morning of 188.9 lbs- which is better than your previously reported weight ranges of 195-199 lbs  Patient will contact [employer] to discuss concerns with her home Oxygen machine being loud Patient will use home O2 as prescribed Patient will maintain contact with Clinical Social Worker and Pharmacist at Dr. Raphael Gibney office       Diabetes Mellitus and Standards of Medical Care Living with and managing  diabetes (diabetes mellitus) can be complicated. Your diabetes treatment may be managed by a team of health care providers, including: A physician who specializes in diabetes (endocrinologist). You might also have visits with a nurse practitioner or physician assistant. Nurses. A registered dietitian. A certified diabetes care and education specialist. An exercise specialist. A pharmacist. An eye doctor. A foot specialist (podiatrist). A dental care provider. A primary care provider. A mental health care provider. How to manage your diabetes You can do many things to successfully manage your diabetes. Your health care providers will follow guidelines to help you get the best quality of care. Here are general guidelines for your diabetes management plan. Your health care providers may give you more specific instructions. Physical exams When you are diagnosed with diabetes, and each year after that, your health care provider will ask about your medical and family history. You will have a physical exam, which may include: Measuring your height, weight, and body mass index (BMI). Checking your blood pressure. This will be done at every routine medical visit. Your target blood pressure may vary depending on your medical conditions, your age, and other factors. A thyroid exam. A skin exam. Screening for nerve damage (peripheral neuropathy). This may include checking the  pulse in your legs and feet and the level of sensation in your hands and feet. A foot exam to inspect the structure and skin of your feet, including checking for cuts, bruises, redness, blisters, sores, or other problems. Screening for blood vessel (vascular) problems. This may include checking the pulse in your legs and feet and checking your temperature. Blood tests Depending on your treatment plan and your personal needs, you may have the following tests: Hemoglobin A1C (HbA1C). This test provides information about blood sugar  (glucose) control over the previous 2-3 months. It is used to adjust your treatment plan, if needed. This test will be done: At least 2 times a year, if you are meeting your treatment goals. 4 times a year, if you are not meeting your treatment goals or if your goals have changed. Lipid testing, including total cholesterol, LDL and HDL cholesterol, and triglyceride levels. The goal for LDL is less than 100 mg/dL (5.5 mmol/L). If you are at high risk for complications, the goal is less than 70 mg/dL (3.9 mmol/L). The goal for HDL is 40 mg/dL (2.2 mmol/L) or higher for men, and 50 mg/dL (2.8 mmol/L) or higher for women. An HDL cholesterol of 60 mg/dL (3.3 mmol/L) or higher gives some protection against heart disease. The goal for triglycerides is less than 150 mg/dL (8.3 mmol/L). Liver function tests. Kidney function tests. Thyroid function tests.  Dental and eye exams  Visit your dentist two times a year. If you have type 1 diabetes, your health care provider may recommend an eye exam within 5 years after you are diagnosed, and then once a year after your first exam. For children with type 1 diabetes, the health care provider may recommend an eye exam when your child is age 40 or older and has had diabetes for 3-5 years. After the first exam, your child should get an eye exam once a year. If you have type 2 diabetes, your health care provider may recommend an eye exam as soon as you are diagnosed, and then every 1-2 years after your first exam. Immunizations A yearly flu (influenza) vaccine is recommended annually for everyone 6 months or older. This is especially important if you have diabetes. The pneumonia (pneumococcal) vaccine is recommended for everyone 2 years or older who has diabetes. If you are age 39 or older, you may get the pneumonia vaccine as a series of two separate shots. The hepatitis B vaccine is recommended for adults shortly after being diagnosed with diabetes. Adults and  children with diabetes should receive all other vaccines according to age-specific recommendations from the Centers for Disease Control and Prevention (CDC). Mental and emotional health Screening for symptoms of eating disorders, anxiety, and depression is recommended at the time of diagnosis and after as needed. If your screening shows that you have symptoms, you may need more evaluation. You may work with a mental health care provider. Follow these instructions at home: Treatment plan You will monitor your blood glucose levels and may give yourself insulin. Your treatment plan will be reviewed at every medical visit. You and your health care provider will discuss: How you are taking your medicines, including insulin. Any side effects you have. Your blood glucose level target goals. How often you monitor your blood glucose level. Lifestyle habits, such as activity level and tobacco, alcohol, and substance use. Education Your health care provider will assess how well you are monitoring your blood glucose levels and whether you are taking your insulin and medicines  correctly. He or she may refer you to: A certified diabetes care and education specialist to manage your diabetes throughout your life, starting at diagnosis. A registered dietitian who can create and review your personal nutrition plan. An exercise specialist who can discuss your activity level and exercise plan. General instructions Take over-the-counter and prescription medicines only as told by your health care provider. Keep all follow-up visits. This is important. Where to find support There are many diabetes support networks, including: American Diabetes Association (ADA): diabetes.org Defeat Diabetes Foundation: defeatdiabetes.org Where to find more information American Diabetes Association (ADA): www.diabetes.org Association of Diabetes Care & Education Specialists (ADCES): diabeteseducator.org International Diabetes  Federation (IDF): http://hill.biz/ Summary Managing diabetes (diabetes mellitus) can be complicated. Your diabetes treatment may be managed by a team of health care providers. Your health care providers follow guidelines to help you get the best quality care. You should have physical exams, blood tests, blood pressure monitoring, immunizations, and screening tests regularly. Stay updated on how to manage your diabetes. Your health care providers may also give you more specific instructions based on your individual health. This information is not intended to replace advice given to you by your health care provider. Make sure you discuss any questions you have with your health care provider. Document Revised: 07/31/2019 Document Reviewed: 07/31/2019 Elsevier Patient Education  2022 [employer].   The patient verbalized understanding of instructions, educational materials, and care plan provided today and agreed to receive a mailed copy of patient instructions, educational materials, and care plan Telephone follow up appointment with care management team member scheduled for:  Monday, December 20, 2020 at 11:30 am The patient has been provided with contact information for the care management team and has been advised to call with any health related questions or concerns  Caryl Pina, RN, BSN, CCRN [facility] RN Care Coordination- [facility] [place] [phone]: direct office [phone]: mobile

## 2020-12-19 ENCOUNTER — Other Ambulatory Visit: Payer: Self-pay | Admitting: Physician Assistant

## 2020-12-20 ENCOUNTER — Ambulatory Visit (INDEPENDENT_AMBULATORY_CARE_PROVIDER_SITE_OTHER): Payer: Medicare Other | Admitting: *Deleted

## 2020-12-20 DIAGNOSIS — E1165 Type 2 diabetes mellitus with hyperglycemia: Secondary | ICD-10-CM

## 2020-12-20 DIAGNOSIS — I5032 Chronic diastolic (congestive) heart failure: Secondary | ICD-10-CM

## 2020-12-20 DIAGNOSIS — I4891 Unspecified atrial fibrillation: Secondary | ICD-10-CM

## 2020-12-20 NOTE — Chronic Care Management (AMB) (Signed)
Chronic Care Management   CCM RN Visit Note  12/20/2020 Name: Mary Mccarthy MRN: [id number] DOB: 1941/01/20  Subjective: Mary Mccarthy is a 80 y.o. year old female who is a primary care patient of Biagio Borg, MD. The care management team was consulted for assistance with disease management and care coordination needs.    Engaged with patient by telephone for follow up visit in response to provider referral for case management and/or care coordination services.   Consent to Services:  The patient was given information about Chronic Care Management services, agreed to services, and gave verbal consent prior to initiation of services.  Please see initial visit note for detailed documentation.  Patient agreed to services and verbal consent obtained.   Assessment: Review of patient past medical history, allergies, medications, health status, including review of consultants reports, laboratory and other test data, was performed as part of comprehensive evaluation and provision of chronic care management services.   CCM Care Plan Allergies  Allergen Reactions   Ibuprofen Other (See Comments)    Bleeding events   Outpatient Encounter Medications as of 12/20/2020  Medication Sig   acetaminophen (TYLENOL) 500 MG tablet Take 500 mg by mouth every 6 (six) hours as needed for moderate pain.   ALPRAZolam (XANAX) 0.25 MG tablet Take 0.25 mg by mouth 3 x daily with food.   amiodarone (PACERONE) 200 MG tablet Take 1 tablet (200 mg total) by mouth daily.   amLODipine (NORVASC) 10 MG tablet Take 1 tablet (10 mg total) by mouth daily.   apixaban (ELIQUIS) 5 MG TABS tablet Take 1 tablet (5 mg total) by mouth 2 (two) times daily.   atorvastatin (LIPITOR) 40 MG tablet TAKE 1 TABLET BY MOUTH EVERY DAY   blood glucose meter kit and supplies Dispense based on patient and insurance preference. Use up to four times daily as directed. E11.9   Blood Glucose Monitoring Suppl (ONE TOUCH ULTRA 2) w/Device KIT  Use as directed daily   citalopram (CELEXA) 10 MG tablet Take 1 tablet (10 mg total) by mouth daily.   cyanocobalamin ([employer] VITAMIN B12) 1000 MCG tablet TAKE 1 TABLET BY MOUTH EVERY DAY   furosemide (LASIX) 40 MG tablet Take 1 tablet (40 mg total) by mouth daily.   Lancets (ONETOUCH DELICA PLUS [id number]) MISC USE AS DIRECTED TEST 1-2 TIMES A DAY   losartan (COZAAR) 50 MG tablet Take 1 tablet (50 mg total) by mouth daily.   metFORMIN (GLUCOPHAGE) 1000 MG tablet TAKE 1 TAB BY MOUTH IN THE AM,AND 1/2 TAB IN THE PM (Patient taking differently: TAKE 1/2 TAB(500 mg) BY MOUTH IN THE AM, AND 1/4 TAB(250 mg) IN THE PM)   metoprolol succinate (TOPROL-XL) 25 MG 24 hr tablet Take 0.5 tablets (12.5 mg total) by mouth daily.   ONETOUCH ULTRA test strip USE AS INSTRUCTED TEST 1-2 TIMES A DAY   pantoprazole (PROTONIX) 40 MG tablet TAKE 1 TABLET BY MOUTH TWICE A DAY   potassium chloride SA (KLOR-CON) 20 MEQ tablet Take 1 tablet (20 mEq total) by mouth daily. (Patient not taking: Reported on 11/29/2020)   No facility-administered encounter medications on file as of 12/20/2020.   Patient Active Problem List   Diagnosis Date Noted   Long term current use of amiodarone for rate-rhythm control of A. fib 11/28/2020   Chronic respiratory failure ([place]) 11/22/2020   Dehydration 11/02/2020   Diabetes mellitus type 2 in obese ([place]) 11/02/2020   AKI (acute kidney injury) ([place]) 11/01/2020   Transient post TAVR  CHB -> not seen on follow-up Zio patch 10/28/2020   S/P TAVR (transcatheter aortic valve replacement) 10/26/2020   Melena 10/20/2020   Symptomatic anemia 10/19/2020   Acute bronchitis 09/21/2020   Current use of long term anticoagulation - for Afib; CHA2DS2-VASc 9, on Eliquis 08/23/2020   PAF with RVR: CHA2DS2-VASc 9.  On Eliquis and amiodarone. 08/11/2020   History of CVA (cerebrovascular accident) 08/10/2020   Hypomagnesemia 08/10/2020   Severe aortic stenosis 08/2020   Aortic atherosclerosis ([place])  03/18/2020   B12 deficiency 09/21/2019   Cough 03/14/2018   Wheezing 03/14/2018   Anxiety 09/11/2017   Osteoporosis 03/10/2016   Coronary artery disease, non-occlusive 05/24/2015   GI bleed    Pulmonary hypertension (HCC)    CHF (congestive heart failure), NYHA class II, chronic, diastolic ([place]) 97/47/1855   Gastric ulceration    AVM (arteriovenous malformation) of colon    Demand ischemia of myocardium (HCC)    Right knee pain 05/21/2014   Anemia, iron deficiency 05/20/2013   MENOPAUSAL DISORDER 12/07/2009   INSOMNIA 09/24/2008   ALLERGIC RHINITIS 06/24/2007   GERD 06/24/2007   COLONIC POLYPS, HX OF 06/24/2007   Hyperlipidemia with target LDL less than 70 10/08/2006   Depression 10/08/2006   Essential hypertension 10/08/2006   PEPTIC ULCER DISEASE 10/08/2006   Type 2 diabetes mellitus with hyperglycemia, without long-term current use of insulin ([place]) 10/05/2006   Morbid obesity ([place]) 10/05/2006   Conditions to be addressed/monitored:  Atrial Fibrillation, CHF, and DMII  Care Plan : RN Care Manager Plan of Care  Updates made by Knox Royalty, RN since 12/20/2020 12:00 AM     Problem: Chronic Disease Management Needs   Priority: High     Long-Range Goal: Development of plan of care for long term chronic disease management   Start Date: 11/17/2020  Expected End Date: 11/17/2021  Priority: High  Note:   Current Barriers:  Chronic Disease Management support and education needs related to CHF and DMII Lives with son- who works during daytime hours: alone during day, limited stamina to perform ADL's/ iADL's after multiple recent hospitalizations Confusion around multiple new medications, post- recent hospitalizations- 12/06/20- confirmed patient has spoken with CCM Pharmacy team 12/06/20: Patient has received home oxygen, however, she reports not using: states oxygenator is "too loud" and keeps her awake: 12/20/20-- reports unable to get through to [facility]: care coordination  outreach placed on patient's behalf Multiple recent inpatient hospitalizations: 12/06/20: confirmed no new/ recent hospital or ED visits since last discharge 11/04/20 August 22-26, 2022: CHF September 12-22, 2022: AF with RVR; CHF; TAVR September 26-29, 2022: N/V, AKI  RNCM Clinical Goal(s):  Patient will verbalize basic understanding of CHF and DMII disease process and self health management plan CHF; A-Fib; DMII  through collaboration with RN Care manager, provider, and care team.   Interventions: 1:1 collaboration with primary care provider regarding development and update of comprehensive plan of care as evidenced by provider attestation and co-signature Inter-disciplinary care team collaboration (see longitudinal plan of care) Evaluation of current treatment plan related to  self management and patient's adherence to plan as established by provider Discussed with patient current clinical condition: she reports acute episode of nausea/ vomiting this morning Reports she believes this episode "may" be related to ongoing constipation post- recent hospitalizations; patient has not previously mentioned constipation to me in previous outreaches; states this has been an ongoing problem post- recent hospitalizations Has not reported to PCP, did not mention to [facility] pharmacy team at time of  their visit Advised patient if nausea/ vomiting/ constipation continue to make PCP appointment to evaluate Reports she believes current symptoms/ constipation may be related to "all of the medicine I am taking;" again states she did not mention to CCM pharmacy team: encouraged her to contact CCM pharmacist to determine if constipation is a concern with side effects of current medication regimen Clarifies that nausea/ vomiting "just started this morning;" she also mentions that her anxiety around her recent hospitalizations/ state of health in general, may be contributing factor Confirmed that patient is not taking  celexa-- has resumed xanax HS only- does not take during day time; states Xanax "helps" with anxiety, taking at HS  Heart Failure Interventions:  (Status: Goal on Track (progressing): YES.) Basic overview and discussion of pathophysiology of Heart Failure reviewed; Provided education on low sodium diet; Discussed importance of daily weight and advised patient to weigh and record daily; Reviewed role of diuretics in prevention of fluid overload and management of heart failure; Discussed the importance of keeping all appointments with provider; Reviewed recent weights at home: reports consistently between 190-193, with weight this morning of "193 lbs;" denies weight gain > 3 lbs overnight/ 5 lbs in one week Reviewed previously provided education monitoring daily weights/ breathing status at home, and to follow action plan for yellow CHF zone: patient continues to require ongoing reinforcement/ support Confirms that she attempted to contact [facility] about her home O2 oxygenator "being too loud to use regularly;" care coordination outreach placed with [facility] on behalf of patient: spoke with "Jodie" and provided pertinent information provided by patient; Jodie reports she will have services personnel contact patient this afternoon to address her stated issues; contacted patient back to provide update on same, reviewed and encouraged patient to begin using home O2 as prescribed once the issues with her oxygenator are addressed: 2 L/ min qHS Confirms has not been using home O2 "very much;" again stating that it "is just too loud- it drives me crazy;"  Confirms no recent signs/ symptoms A-Fib-- signs/ symptoms along with corresponding action plan discussed- will provide additional printed educational material Reviewed upcoming provider office visit with patient: 02/16/21- PCP office visit; she also reports that she has "heart tests that will be scheduled in November;" and then a follow up appointment with  cardiologist Ellyn Hack)-- not yet scheduled by cardiology team  Diabetes:  (Status: Goal on Track (progressing): YES.) Lab Results  Component Value Date   HGBA1C 5.8 (H) 10/26/2020  Discussed plans with patient for ongoing care management follow up and provided patient with direct contact information for care management team;      Review of patient status, including review of consultants reports, relevant laboratory and other test results, and medications completed;       Confirmed patient continues monitoring/ recording blood sugars at home BID: fasting and post-prandial Reviewed with patient recent blood sugars at home: reports consistent fasting blood sugar values between 120-165; fasting this morning reported at "134" Post-prandial blood sugar values again reported between "130-140" Confirmed no recent low blood sugars <80  Patient Goals/Self-Care Activities: Patient will self administer medications as prescribed   Patient will attend all scheduled provider appointments  Patient will call pharmacy for medication refills  Patient will call provider office for new concerns or questions  Patient will continue to follow heart healthy, low salt, carbohydrate modified, low sugar diet Patient will continue to monitor and record fasting and afternoon blood sugars at home daily Patient will continue to monitor and  record daily weights at home: you reported a weight this morning of 193 lbs- this is slightly higher than the last weight you reported on 12/06/20 of 188  Patient will use home O2 as prescribed Patient will maintain contact with Clinical Social Worker and Pharmacist at Dr. Gwynn Burly office       Plan: Telephone follow up appointment with care management team member scheduled for:  Wednesday, January 19, 2021 at 11:30 am The patient has been provided with contact information for the care management team and has been advised to call with any health related questions or concerns  [name], RN, BSN, [place] [phone]: direct office [phone]: mobile

## 2020-12-20 NOTE — Patient Instructions (Addendum)
Visit Information   Katisha, it was nice talking with you today.   I have placed a call to [employer] about your report that your home oxygenator is loud- I have asked them to contact you to follow up on this issue: please listen out for a call from [place]; the phone number for [employer] is: [phone]; once the issues with your home oxygenator are resolved, please begin using your home oxygen as prescribed: 2 L/min every night at bedtime  Please talk to your pharmacists at Dr. Gwynn Burly office about the side effects of your medicines- please write down your questions about your medicines so you wil have them ready when the pharmacist contacts you; if you continue to experience constipation, nausea and vomiting, please make an appointment with Dr. Jenny Reichmann  I look forward to talking to you again for a telephone update on January 19, 2021 at 11:30 am- please be listening out for my call that day.  I will call as close to 11:30 am as possible.   If you need to cancel or re-schedule our telephone visit, please call [phone] and one of our care guides will be happy to assist you.   I look forward to hearing about your progress.   Please don't hesitate to contact me if I can be of assistance to you before our next scheduled telephone appointment.   [name], RN, BSN, [facility] RN Care Coordination- [place] [phone]: direct office [phone]: mobile   Patient Self-Care Activities: Patient  Tristin will: Administer medications as prescribed   Attend all scheduled provider appointments  Call pharmacy for medication refills  Call provider office for new concerns or questions  Continue to follow heart healthy, low salt, carbohydrate modified, low sugar diet Continue to monitor and record fasting and afternoon blood sugars at home daily Continue to monitor and record daily weights at home: you reported a weight this morning of 193 lbs- this is  slightly higher than the last weight you reported on 12/06/20 of "188 lbs" Use home O2 as prescribed: 2 L/min at night time Maintain contact with Clinical Social Worker and Pharmacist at Dr. Gwynn Burly office  Atrial Fibrillation Atrial fibrillation is a type of heartbeat that is irregular or fast. If you have this condition, your heart beats without any order. This makes it hard for your heart to pump blood in a normal way. Atrial fibrillation may come and go, or it may become a long-lasting problem. If this condition is not treated, it can put you at higher risk for stroke, heart failure, and other heart problems. What are the causes? This condition may be caused by diseases that damage the heart. They include: High blood pressure. Heart failure. Heart valve disease. Heart surgery. Other causes include: Diabetes. Thyroid disease. Being overweight. Kidney disease. Sometimes the cause is not known. What increases the risk? You are more likely to develop this condition if: You are older. You smoke. You exercise often and very hard. You have a family history of this condition. You are a man. You use drugs. You drink a lot of alcohol. You have lung conditions, such as emphysema, pneumonia, or COPD. You have sleep apnea. What are the signs or symptoms? Common symptoms of this condition include: A feeling that your heart is beating very fast. Chest pain or discomfort. Feeling short of breath. Suddenly feeling light-headed or weak. Getting tired easily during activity. Fainting. Sweating. In some cases, there are no symptoms. How is  this treated? Treatment for this condition depends on underlying conditions and how you feel when you have atrial fibrillation. They include: Medicines to: Prevent blood clots. Treat heart rate or heart rhythm problems. Using devices, such as a pacemaker, to correct heart rhythm problems. Doing surgery to remove the part of the heart that sends bad  signals. Closing an area where clots can form in the heart (left atrial appendage). In some cases, your doctor will treat other underlying conditions. Follow these instructions at home: Medicines Take over-the-counter and prescription medicines only as told by your doctor. Do not take any new medicines without first talking to your doctor. If you are taking blood thinners: Talk with your doctor before you take any medicines that have aspirin or NSAIDs, such as ibuprofen, in them. Take your medicine exactly as told by your doctor. Take it at the same time each day. Avoid activities that could hurt or bruise you. Follow instructions about how to prevent falls. Wear a bracelet that says you are taking blood thinners. Or, carry a card that lists what medicines you take. Lifestyle   Do not use any products that have nicotine or tobacco in them. These include cigarettes, e-cigarettes, and chewing tobacco. If you need help quitting, ask your doctor. Eat heart-healthy foods. Talk with your doctor about the right eating plan for you. Exercise regularly as told by your doctor. Do not drink alcohol. Lose weight if you are overweight. Do not use drugs, including cannabis. General instructions If you have a condition that causes breathing to stop for a short period of time (apnea), treat it as told by your doctor. Keep a healthy weight. Do not use diet pills unless your doctor says they are safe for you. Diet pills may make heart problems worse. Keep all follow-up visits as told by your doctor. This is important. Contact a doctor if: You notice a change in the speed, rhythm, or strength of your heartbeat. You are taking a blood-thinning medicine and you get more bruising. You get tired more easily when you move or exercise. You have a sudden change in weight. Get help right away if:  You have pain in your chest or your belly (abdomen). You have trouble breathing. You have side effects of blood  thinners, such as blood in your vomit, poop (stool), or pee (urine), or bleeding that cannot stop. You have any signs of a stroke. "BE FAST" is an easy way to remember the main warning signs: B - Balance. Signs are dizziness, sudden trouble walking, or loss of balance. E - Eyes. Signs are trouble seeing or a change in how you see. F - Face. Signs are sudden weakness or loss of feeling in the face, or the face or eyelid drooping on one side. A - Arms. Signs are weakness or loss of feeling in an arm. This happens suddenly and usually on one side of the body. S - Speech. Signs are sudden trouble speaking, slurred speech, or trouble understanding what people say. T - Time. Time to call emergency services. Write down what time symptoms started. You have other signs of a stroke, such as: A sudden, very bad headache with no known cause. Feeling like you may vomit (nausea). Vomiting. A seizure. These symptoms may be an emergency. Do not wait to see if the symptoms will go away. Get medical help right away. Call your local emergency services (911 in the [place] Do not drive yourself to the hospital. Summary Atrial fibrillation is a type  of heartbeat that is irregular or fast. You are at higher risk of this condition if you smoke, are older, have diabetes, or are overweight. Follow your doctor's instructions about medicines, diet, exercise, and follow-up visits. Get help right away if you have signs or symptoms of a stroke. Get help right away if you cannot catch your breath, or you have chest pain or discomfort. This information is not intended to replace advice given to you by your health care provider. Make sure you discuss any questions you have with your health care provider. Document Revised: 07/17/2018 Document Reviewed: 07/17/2018 Elsevier Patient Education  2022 [employer].   The patient verbalized understanding of instructions, educational materials, and care plan provided today and agreed  to receive a mailed copy of patient instructions, educational materials, and care plan Telephone follow up appointment with care management team member scheduled for:  Wednesday January 19, 2021 at 11:30 am The patient has been provided with contact information for the care management team and has been advised to call with any health related questions or concerns  Caryl Pina, RN, BSN, CCRN [facility] RN Care Coordination- [employer] [place] [phone]: direct office [phone]: mobile

## 2020-12-25 DIAGNOSIS — I509 Heart failure, unspecified: Secondary | ICD-10-CM | POA: Diagnosis not present

## 2020-12-29 ENCOUNTER — Ambulatory Visit: Payer: Medicare Other | Admitting: *Deleted

## 2020-12-29 DIAGNOSIS — I5032 Chronic diastolic (congestive) heart failure: Secondary | ICD-10-CM

## 2020-12-29 DIAGNOSIS — E1165 Type 2 diabetes mellitus with hyperglycemia: Secondary | ICD-10-CM

## 2020-12-29 DIAGNOSIS — F419 Anxiety disorder, unspecified: Secondary | ICD-10-CM

## 2020-12-29 DIAGNOSIS — I480 Paroxysmal atrial fibrillation: Secondary | ICD-10-CM

## 2020-12-29 NOTE — Patient Instructions (Signed)
Visit Information  Thank you for taking time to visit with me today. Please don't hesitate to contact me if I can be of assistance to you before our next scheduled telephone appointment.  Following are the goals we discussed today:  (Copy and paste patient goals from clinical care plan here)  Our next appointment is by telephone on 01/28/21 at 10:45AM  Please call the care guide team at [phone] if you need to cancel or reschedule your appointment.   Please call the Suicide and Crisis Lifeline: 988 call the [place] National Suicide Prevention Lifeline: 509-238-8281 or TTY: 201 386 2262 TTY 609 724 6557) to talk to a trained counselor call 1-800-273-TALK (toll free, 24 hour hotline) go to [facility] [address], [place] [phone]) call 911 if you are experiencing a Mental Health or Behavioral Health Crisis or need someone to talk to.  The patient verbalized understanding of instructions, educational materials, and care plan provided today and declined offer to receive copy of patient instructions, educational materials, and care plan.   Reece Levy MSW, LCSW Licensed Clinical Social Worker [facility] [place] [phone]

## 2020-12-29 NOTE — Chronic Care Management (AMB) (Signed)
Chronic Care Management    Clinical Social Work Note  12/29/2020 Name: Mary Mccarthy MRN: [id number] DOB: 11-18-1940  Mary Mccarthy is a 80 y.o. year old female who is a primary care patient of Jenny Reichmann, Hunt Oris, MD. The CCM team was consulted to assist the patient with chronic disease management and/or care coordination needs related to: [place] and Resources.   Engaged with patient by telephone for follow up visit in response to provider referral for social work chronic care management and care coordination services.   Consent to Services:  The patient was given information about Chronic Care Management services, agreed to services, and gave verbal consent prior to initiation of services.  Please see initial visit note for detailed documentation.   Patient agreed to services and consent obtained.   Assessment: Review of patient past medical history, allergies, medications, and health status, including review of relevant consultants reports was performed today as part of a comprehensive evaluation and provision of chronic care management and care coordination services.     SDOH (Social Determinants of Health) assessments and interventions performed:    Advanced Directives Status: Not addressed in this encounter.  CCM Care Plan  Allergies  Allergen Reactions   Ibuprofen Other (See Comments)    Bleeding events    Outpatient Encounter Medications as of 12/29/2020  Medication Sig   acetaminophen (TYLENOL) 500 MG tablet Take 500 mg by mouth every 6 (six) hours as needed for moderate pain.   ALPRAZolam (XANAX) 0.25 MG tablet Take 0.25 mg by mouth 3 x daily with food.   amiodarone (PACERONE) 200 MG tablet Take 1 tablet (200 mg total) by mouth daily.   amLODipine (NORVASC) 10 MG tablet Take 1 tablet (10 mg total) by mouth daily.   apixaban (ELIQUIS) 5 MG TABS tablet Take 1 tablet (5 mg total) by mouth 2 (two) times daily.   atorvastatin (LIPITOR) 40 MG tablet TAKE 1  TABLET BY MOUTH EVERY DAY   blood glucose meter kit and supplies Dispense based on patient and insurance preference. Use up to four times daily as directed. E11.9   Blood Glucose Monitoring Suppl (ONE TOUCH ULTRA 2) w/Device KIT Use as directed daily   citalopram (CELEXA) 10 MG tablet Take 1 tablet (10 mg total) by mouth daily.   cyanocobalamin ([employer] VITAMIN B12) 1000 MCG tablet TAKE 1 TABLET BY MOUTH EVERY DAY   furosemide (LASIX) 40 MG tablet Take 1 tablet (40 mg total) by mouth daily.   Lancets (ONETOUCH DELICA PLUS [id number]) MISC USE AS DIRECTED TEST 1-2 TIMES A DAY   losartan (COZAAR) 50 MG tablet Take 1 tablet (50 mg total) by mouth daily.   metFORMIN (GLUCOPHAGE) 1000 MG tablet TAKE 1 TAB BY MOUTH IN THE AM,AND 1/2 TAB IN THE PM (Patient taking differently: TAKE 1/2 TAB(500 mg) BY MOUTH IN THE AM, AND 1/4 TAB(250 mg) IN THE PM)   metoprolol succinate (TOPROL-XL) 25 MG 24 hr tablet Take 0.5 tablets (12.5 mg total) by mouth daily.   ONETOUCH ULTRA test strip USE AS INSTRUCTED TEST 1-2 TIMES A DAY   pantoprazole (PROTONIX) 40 MG tablet TAKE 1 TABLET BY MOUTH TWICE A DAY   potassium chloride SA (KLOR-CON) 20 MEQ tablet Take 1 tablet (20 mEq total) by mouth daily. (Patient not taking: Reported on 11/29/2020)   No facility-administered encounter medications on file as of 12/29/2020.    Patient Active Problem List   Diagnosis Date Noted   Long term current use of amiodarone for rate-rhythm  control of A. fib 11/28/2020   Chronic respiratory failure ([place]) 11/22/2020   Dehydration 11/02/2020   Diabetes mellitus type 2 in obese ([place]) 11/02/2020   AKI (acute kidney injury) ([place]) 11/01/2020   Transient post TAVR CHB -> not seen on follow-up Zio patch 10/28/2020   S/P TAVR (transcatheter aortic valve replacement) 10/26/2020   Melena 10/20/2020   Symptomatic anemia 10/19/2020   Acute bronchitis 09/21/2020   Current use of long term anticoagulation - for Afib; CHA2DS2-VASc 9, on Eliquis  08/23/2020   PAF with RVR: CHA2DS2-VASc 9.  On Eliquis and amiodarone. 08/11/2020   History of CVA (cerebrovascular accident) 08/10/2020   Hypomagnesemia 08/10/2020   Severe aortic stenosis 08/2020   Aortic atherosclerosis ([place]) 03/18/2020   B12 deficiency 09/21/2019   Cough 03/14/2018   Wheezing 03/14/2018   Anxiety 09/11/2017   Osteoporosis 03/10/2016   Coronary artery disease, non-occlusive 05/24/2015   GI bleed    Pulmonary hypertension (HCC)    CHF (congestive heart failure), NYHA class II, chronic, diastolic ([place]) 95/18/8416   Gastric ulceration    AVM (arteriovenous malformation) of colon    Demand ischemia of myocardium (HCC)    Right knee pain 05/21/2014   Anemia, iron deficiency 05/20/2013   MENOPAUSAL DISORDER 12/07/2009   INSOMNIA 09/24/2008   ALLERGIC RHINITIS 06/24/2007   GERD 06/24/2007   COLONIC POLYPS, HX OF 06/24/2007   Hyperlipidemia with target LDL less than 70 10/08/2006   Depression 10/08/2006   Essential hypertension 10/08/2006   PEPTIC ULCER DISEASE 10/08/2006   Type 2 diabetes mellitus with hyperglycemia, without long-term current use of insulin ([place]) 10/05/2006   Morbid obesity ([place]) 10/05/2006    Conditions to be addressed/monitored: Anxiety and Depression; Mental Health Concerns   Care Plan : LCSW Plan of Care  Updates made by Deirdre Peer, LCSW since 12/29/2020 12:00 AM     Problem: Anxiety   Priority: High     Long-Range Goal: Reduce anxiety through support, services, medication and counseling   Start Date: 12/01/2020  Expected End Date: 02/04/2021  This Visit's Progress: On track  Recent Progress: On track  Priority: High  Note:   Current barriers:   Acute Mental Health needs related to anxiety with panic attacks Mental Health Concerns  Needs Support, Education, and Care Coordination in order to meet unmet mental health needs. Clinical Goal(s): demonstrate a reduction in symptoms related to :Anxiety   Clinical Interventions:    12/29/20- Pt reports her anxiety is "much better" since our last visit. She is a bit frustrated about constipation- will ask RPH and PCP to discuss further with her. Pt wonders if her RX are causing this; as well her CBG's have been higher and she has questions about altering these meds as well. Encouraged pt to take all meds as prescribed and CSW will alert PCP and RPH.   Pt does not feel she needs counseling now- discussed signs of some anxiety today with the above and she is not taking the xanax.  Pt will consider it some more and will discuss and reassess with CSW next month/visit.   and new onset anxiety that began after her recent hospitalization. "I was told I have PTSS- post traumatic stress syndrome". Pt reports having up to 4-5 panic attacks per day- she was given Xanax but has stopped taking it because it made her "jittery and drowsy".  Will ask PCP and Pharmacist to address this and possible alternative RX options.  Pt is agreeable to counseling and prefers it be  tele-health as she is not driving at this time. Her son stays with her at night but she is alone during the day.  Pt agrees to counseling referral be placed with [facility]. Pt denies SI/HI- mild depression with low PHQ2 score.  Assessed patient's previous and current treatment, coping skills, support system and barriers to care  Depression screen reviewed  PHQ2/ PHQ9 completed Solution-Focused Strategies  Active listening / Reflection utilized  Problem [place] strategies reviewed Provided psychoeducation for mental health needs  Reviewed mental health medications with patient and discussed importance of compliance:  Participation in counseling encouraged  Discussed referral to [place] to assist with connecting to mental health provider ; Review various resources, discussed options and provided patient information about  Options for mental health treatment based on need and insurance 1:1 collaboration with  primary care provider regarding development and update of comprehensive plan of care as evidenced by provider attestation and co-signature Inter-disciplinary care team collaboration (see longitudinal plan of care) Patient Goals/Self-Care Activities: Over the next 45 days Discuss medication options with PCP   consider personal counseling - call and visit an old friend - check out volunteer opportunities - join a support group - laugh; watch a funny movie or comedian - perform a random act of kindness - practice relaxation or meditation daily - talk about feelings with a friend, family or spiritual advisor - practice positive thinking and self-talk   .         Follow Up Plan: Appointment scheduled for SW follow up with client by phone on: 01/28/21      Eduard Clos MSW, [place] Licensed Clinical Social Worker [place] [phone]

## 2021-01-03 ENCOUNTER — Telehealth: Payer: Self-pay | Admitting: Internal Medicine

## 2021-01-03 NOTE — Telephone Encounter (Signed)
Pt. Connected to [facility] 11.25.2022.   Caller states her BG 220 around 9:11am. Metformin 500mg  (morning) and 250mg  (night). Uses supplemental oxygen 2L, feels like she has trouble breathing. Caller states she takes daily laxatives, last BM was this morning but small BM.   Advised to see PCP within 2 weeks

## 2021-01-05 DIAGNOSIS — I442 Atrioventricular block, complete: Secondary | ICD-10-CM | POA: Diagnosis not present

## 2021-01-05 DIAGNOSIS — I480 Paroxysmal atrial fibrillation: Secondary | ICD-10-CM

## 2021-01-05 DIAGNOSIS — I5032 Chronic diastolic (congestive) heart failure: Secondary | ICD-10-CM

## 2021-01-05 DIAGNOSIS — E1165 Type 2 diabetes mellitus with hyperglycemia: Secondary | ICD-10-CM | POA: Diagnosis not present

## 2021-01-05 DIAGNOSIS — I4891 Unspecified atrial fibrillation: Secondary | ICD-10-CM

## 2021-01-05 DIAGNOSIS — Z952 Presence of prosthetic heart valve: Secondary | ICD-10-CM | POA: Diagnosis not present

## 2021-01-05 DIAGNOSIS — I2510 Atherosclerotic heart disease of native coronary artery without angina pectoris: Secondary | ICD-10-CM | POA: Diagnosis not present

## 2021-01-05 DIAGNOSIS — Z7901 Long term (current) use of anticoagulants: Secondary | ICD-10-CM | POA: Diagnosis not present

## 2021-01-05 LAB — COMPREHENSIVE METABOLIC PANEL
ALT: 10 IU/L (ref 0–32)
AST: 17 IU/L (ref 0–40)
Albumin/Globulin Ratio: 1.5 (ref 1.2–2.2)
Albumin: 4.2 g/dL (ref 3.6–4.6)
Alkaline Phosphatase: 52 IU/L (ref 44–121)
BUN/Creatinine Ratio: 18 (ref 12–28)
BUN: 26 mg/dL (ref 8–27)
Bilirubin Total: 0.4 mg/dL (ref 0.0–1.2)
CO2: 25 mmol/L (ref 20–29)
Calcium: 10 mg/dL (ref 8.7–10.3)
Chloride: 101 mmol/L (ref 96–106)
Creatinine, Ser: 1.42 mg/dL — ABNORMAL HIGH (ref 0.57–1.00)
Globulin, Total: 2.8 g/dL (ref 1.5–4.5)
Glucose: 197 mg/dL — ABNORMAL HIGH (ref 70–99)
Potassium: 4.3 mmol/L (ref 3.5–5.2)
Sodium: 139 mmol/L (ref 134–144)
Total Protein: 7 g/dL (ref 6.0–8.5)
eGFR: 37 mL/min/{1.73_m2} — ABNORMAL LOW (ref >59)

## 2021-01-06 LAB — C-REACTIVE PROTEIN: CRP: <1 mg/L (ref 0–10)

## 2021-01-06 LAB — SEDIMENTATION RATE: Sed Rate: 12 mm/hr (ref 0–40)

## 2021-01-07 ENCOUNTER — Telehealth: Payer: Self-pay | Admitting: *Deleted

## 2021-01-07 DIAGNOSIS — I5032 Chronic diastolic (congestive) heart failure: Secondary | ICD-10-CM

## 2021-01-07 DIAGNOSIS — R899 Unspecified abnormal finding in specimens from other organs, systems and tissues: Secondary | ICD-10-CM

## 2021-01-07 DIAGNOSIS — R7989 Other specified abnormal findings of blood chemistry: Secondary | ICD-10-CM

## 2021-01-07 NOTE — Telephone Encounter (Signed)
-----   Message from [name], MD sent at 01/06/2021 11:50 PM EST ----- We checked CRP and erythrocyte sedimentation rate to evaluate for inflammation from amiodarone therapy.  These are both normal.  Chemistry panel shows pretty high blood sugar levels.  Kidney function is a little bit down.  Not as good as it had been a month ago, more consistent with where it was 2 months ago.  Perhaps a little on the dehydrated side.  I recommend holding Lasix for 2 days. ->  Would like to recheck a BMP prior to the end of the calendar year.  Liver function tests look good.  Bryan Lemma, MD

## 2021-01-07 NOTE — Telephone Encounter (Signed)
The patient has been notified of the result and verbalized understanding.  All questions (if any) were answered.  Aware to hold lasix for 2 days and restart.Patient states he  Metformin was increased  100 mg in the morning and 500 mg in the evening by primary. Ordered placed for BMP to be done  the last week of  Dec 2022. Tobin Chad, RN 01/07/2021 3:46 PM

## 2021-01-11 LAB — TSH+T3+FREE T4+T3 FREE
Free T-3: 2.5 pg/mL
Free T4 by Dialysis: 1.5 ng/dL
TSH: 8.7 uU/mL — ABNORMAL HIGH
Triiodothyronine (T-3), Serum: 80 ng/dL

## 2021-01-12 ENCOUNTER — Other Ambulatory Visit: Payer: Self-pay | Admitting: Internal Medicine

## 2021-01-19 ENCOUNTER — Ambulatory Visit (INDEPENDENT_AMBULATORY_CARE_PROVIDER_SITE_OTHER): Payer: Medicare Other | Admitting: *Deleted

## 2021-01-19 DIAGNOSIS — E1165 Type 2 diabetes mellitus with hyperglycemia: Secondary | ICD-10-CM

## 2021-01-19 DIAGNOSIS — I5032 Chronic diastolic (congestive) heart failure: Secondary | ICD-10-CM

## 2021-01-19 NOTE — Patient Instructions (Addendum)
Visit Information  [place], thank you for taking time to talk with me today. Please don't hesitate to contact me if I can be of assistance to you before our next scheduled telephone appointment.  Below are the goals we discussed today:   Patient Self-Care Activities: Patient Mary Mccarthy will: Take medications as prescribed   Attend all scheduled provider appointments  Call pharmacy for medication refills  Call provider office for new concerns or questions  Continue to follow heart healthy, low salt, carbohydrate modified, low sugar diet Continue to monitor and record fasting and afternoon blood sugars at home daily Continue to monitor and record daily weights at home: you reported a weight this morning of 188.9 lbs Use home O2 as prescribed-- as needed for episodes of shortness of breath Maintain contact with Clinical Social Worker and Pharmacist at Dr. Gwynn Burly office  Our next scheduled telephone follow up visit/ appointment with care management team member is scheduled on:  Monday February 21, 2021 at 11:30 am  If you need to cancel or re-schedule our visit, please call [phone] and our care guide team will be happy to assist you.   I look forward to hearing about your progress.   [name], RN, BSN, [place] [phone]: direct office  If you are experiencing a Mental Health or [place] or need someone to talk to, please  call the Suicide and Crisis Lifeline: 988 call the [place] National Suicide Prevention Lifeline: (807)317-3834 or TTY: 919-513-8562 TTY (430)854-9245) to talk to a trained counselor call 1-800-273-TALK (toll free, 24 hour hotline) go to [facility] [address], [place] [phone]) call 911   The patient verbalized understanding of instructions, educational materials, and care plan provided today and agreed to receive a mailed copy of  patient instructions, educational materials, and care plan  Basics of Medicine Management Taking your medicines correctly is an important part of managing or preventing medical problems. Make sure you know what disease or condition your medicine is treating, and how and when to take it. If you do not take your medicine correctly, it may not work well and may cause unpleasant side effects, including serious health problems. What should I do when I am taking medicines?  Read all the labels and inserts that come with your medicines. Review the information often and with each refill. Talk with your pharmacist if you get a refill and notice a change in the size, color, or shape of your medicines. Know the potential side effects for each medicine that you take. Try to get all your medicines from the same pharmacy. The pharmacist will have all your information and will understand how your medicines will affect each other (interact). Always carry an updated list of your medicines with you. If there is an emergency, a first responder can quickly see what medicines you are taking. Tell your health care provider about all your medicines, including over-the-counter medicines, vitamins, and herbal or dietary supplements. Your health care provider will make sure that nothing will interact with any of your prescribed medicines. How can I take my medicines safely? Take medicines only as told by your health care provider. Do not take more of your medicine than instructed. Do not take anyone else's medicines. Do not share your medicines with others. Do not stop taking your medicines unless your health care provider tells you to do so. You may need to avoid alcohol or certain foods  or liquids when taking certain medicines. Follow your health care provider's instructions. Do not split, cut, crush, or chew your medicines unless your health care provider tells you to do so. Tell your health care provider if you have  trouble swallowing your medicines. For liquid medicine, use the dosing container that was provided. Household spoons are not accurate. How should I organize my medicines? Know your medicines Know what each of your medicines looks like. This includes size, color, and shape. Tell your health care provider if you are having trouble recognizing all the medicines that you are taking. If you cannot tell your medicines apart because they look similar, keep them in the original bottles. If you cannot read the labels on the bottles, tell your pharmacist to put your medicines in containers with large print. Review your medicines and your schedule with family members, a friend, or a caregiver. Use a pill organizer Use a tool to organize your medicine schedule. Tools include a weekly pillbox, a written chart, a notebook, or a calendar. Your tool should help you remember the following things about each medicine: The name of the medicine. The amount (dose) to take. The schedule. This is the day and time the medicine should be taken. The appearance. This includes color, shape, size, and stamp. How to take your medicines. This includes instructions to take them with food, without food, with fluids, or with other medicines. Create reminders for taking your medicines. Use sticky notes, or use alarms on your watch, mobile device, or phone calendar. You may choose to use a more advanced management system. These systems have storage, alarms, and visual and audio prompts. Some medicines can be taken on an "as-needed" basis. These may include medicines for nausea, constipation, pain, cough and cold, allergies, and anxiety. If you take an as-needed medicine, write down the name and dose, as well as the date and time that you took it. How should I plan for travel? Take your pillbox, medicines, and organization system with you when traveling. Have your medicines refilled before you travel. This will ensure that you do  not run out of your medicines while you are away from home. Always carry an updated list of your medicines with you. If there is an emergency, a first responder can quickly see what medicines you are taking. Do not pack your medicines in checked luggage in case your luggage is lost or delayed. Keep your medicines in your carry-on bag. If any of your medicines is considered a controlled substance, make sure you bring a letter from your health care provider with you. How should I store and discard my medicines? For safe storage: Store medicines in a cool, dry area away from light, or as directed by your health care provider. Do not store medicines in the bathroom. Heat and humidity will affect them. Do not store your medicines with other chemicals or with medicines for pets or other household members. Keep medicines away from children and pets. Do not leave them on counters or bedside tables. Store them in high cabinets or on high shelves. For safe disposal: Check expiration dates regularly. Do not take expired medicines. Discard medicines that are older than the expiration date. Learn a safe way to dispose of your medicines. You may: Use a local government, hospital, or pharmacy medicine-take-back program. If you cannot return the medicine, check the label or package insert to see if the medicine should be thrown out in the garbage or flushed down the toilet. If you are  not sure, ask your health care team. If it is safe to put the medicine in the trash, empty the medicine out of the container. Mix the medicine with cat litter, dirt, coffee grounds, or another unwanted substance. Seal the mixture in a bag or container. Put it in the trash. What should I remember? Tell your health care provider if you: Experience side effects. Have new symptoms. Feel that your medicine is no longer working. Have other concerns about taking your medicines. Review your medicines regularly with your health care  provider. Other medicines, diet, medical conditions, weight changes, and daily habits can all affect how medicines work. Ask if you need to continue taking each medicine, and discuss how well each one is working. Refill your medicines early to avoid running out of them. In case of an accidental overdose, call your local poison control center at 310-514-0858 or go to your local emergency department right away. Summary Taking your medicines correctly is an important part of managing or preventing medical problems. You need to make sure that you understand what you are taking a medicine for, as well as how and when you need to take it. Use a tool to organize your medicine schedule. Tools include a weekly pillbox, a written chart, a notebook, or a calendar. In case of an accidental overdose, call your local Poison Control Center at 404-855-0602 or go to your local emergency department right away. This information is not intended to replace advice given to you by your health care provider. Make sure you discuss any questions you have with your health care provider. Document Revised: 08/31/2020 Document Reviewed: 08/31/2020 Elsevier Patient Education  2022 [employer].

## 2021-01-19 NOTE — Chronic Care Management (AMB) (Signed)
Chronic Care Management   CCM RN Visit Note  01/19/2021 Name: Mary Mccarthy MRN: [id number] DOB: 12-13-1940  Subjective: Mary Mccarthy is a 80 y.o. year old female who is a primary care patient of Biagio Borg, MD. The care management team was consulted for assistance with disease management and care coordination needs.    Engaged with patient by telephone for follow up visit in response to provider referral for case management and/or care coordination services.   Consent to Services:  The patient was given information about Chronic Care Management services, agreed to services, and gave verbal consent prior to initiation of services.  Please see initial visit note for detailed documentation.  Patient agreed to services and verbal consent obtained.   Assessment: Review of patient past medical history, allergies, medications, health status, including review of consultants reports, laboratory and other test data, was performed as part of comprehensive evaluation and provision of chronic care management services.   SDOH (Social Determinants of Health) assessments and interventions performed:  SDOH Interventions    Flowsheet Row Most Recent Value  SDOH Interventions   Transportation Interventions Intervention Not Indicated  [Patient reports has resumed driving short distances,  reports family continues to assist with transportation as/ if indicated]      CCM Care Plan  Allergies  Allergen Reactions   Ibuprofen Other (See Comments)    Bleeding events   Outpatient Encounter Medications as of 01/19/2021  Medication Sig Note   mirtazapine (REMERON) 15 MG tablet Take 15 mg by mouth at bedtime.    acetaminophen (TYLENOL) 500 MG tablet Take 500 mg by mouth every 6 (six) hours as needed for moderate pain.    ALPRAZolam (XANAX) 0.25 MG tablet TAKE 1 TABLET BY MOUTH THREE TIMES A DAY AS NEEDED FOR ANXIETY    amiodarone (PACERONE) 200 MG tablet Take 1 tablet (200 mg total) by mouth daily.     amLODipine (NORVASC) 10 MG tablet Take 1 tablet (10 mg total) by mouth daily.    apixaban (ELIQUIS) 5 MG TABS tablet Take 1 tablet (5 mg total) by mouth 2 (two) times daily.    atorvastatin (LIPITOR) 40 MG tablet TAKE 1 TABLET BY MOUTH EVERY DAY    blood glucose meter kit and supplies Dispense based on patient and insurance preference. Use up to four times daily as directed. E11.9    Blood Glucose Monitoring Suppl (ONE TOUCH ULTRA 2) w/Device KIT Use as directed daily    citalopram (CELEXA) 10 MG tablet Take 1 tablet (10 mg total) by mouth daily.    cyanocobalamin ([employer] VITAMIN B12) 1000 MCG tablet TAKE 1 TABLET BY MOUTH EVERY DAY    furosemide (LASIX) 40 MG tablet Take 1 tablet (40 mg total) by mouth daily.    Lancets (ONETOUCH DELICA PLUS [id number]) MISC USE AS DIRECTED TEST 1-2 TIMES A DAY    losartan (COZAAR) 50 MG tablet Take 1 tablet (50 mg total) by mouth daily.    metFORMIN (GLUCOPHAGE) 1000 MG tablet TAKE 1 TAB BY MOUTH IN THE AM,AND 1/2 TAB IN THE PM (Patient taking differently: TAKE 1/2 TAB(500 mg) BY MOUTH IN THE AM, AND 1/4 TAB(250 mg) IN THE PM) 01/19/2021: 01/19/21- reports taking 1000 mg po q am; 500 mg qHS- patient has independently increased due to increasing blood sugars    metoprolol succinate (TOPROL-XL) 25 MG 24 hr tablet Take 0.5 tablets (12.5 mg total) by mouth daily.    ONETOUCH ULTRA test strip USE AS INSTRUCTED TEST 1-2 TIMES A DAY  pantoprazole (PROTONIX) 40 MG tablet TAKE 1 TABLET BY MOUTH TWICE A DAY    potassium chloride SA (KLOR-CON) 20 MEQ tablet Take 1 tablet (20 mEq total) by mouth daily. (Patient not taking: Reported on 11/29/2020)    No facility-administered encounter medications on file as of 01/19/2021.   Patient Active Problem List   Diagnosis Date Noted   Long term current use of amiodarone for rate-rhythm control of A. fib 11/28/2020   Chronic respiratory failure ([place]) 11/22/2020   Dehydration 11/02/2020   Diabetes mellitus type 2 in obese ([place])  11/02/2020   AKI (acute kidney injury) ([place]) 11/01/2020   Transient post TAVR CHB -> not seen on follow-up Zio patch 10/28/2020   S/P TAVR (transcatheter aortic valve replacement) 10/26/2020   Melena 10/20/2020   Symptomatic anemia 10/19/2020   Acute bronchitis 09/21/2020   Current use of long term anticoagulation - for Afib; CHA2DS2-VASc 9, on Eliquis 08/23/2020   PAF with RVR: CHA2DS2-VASc 9.  On Eliquis and amiodarone. 08/11/2020   History of CVA (cerebrovascular accident) 08/10/2020   Hypomagnesemia 08/10/2020   Severe aortic stenosis 08/2020   Aortic atherosclerosis ([place]) 03/18/2020   B12 deficiency 09/21/2019   Cough 03/14/2018   Wheezing 03/14/2018   Anxiety 09/11/2017   Osteoporosis 03/10/2016   Coronary artery disease, non-occlusive 05/24/2015   GI bleed    Pulmonary hypertension (HCC)    CHF (congestive heart failure), NYHA class II, chronic, diastolic ([place]) 89/21/1941   Gastric ulceration    AVM (arteriovenous malformation) of colon    Demand ischemia of myocardium (HCC)    Right knee pain 05/21/2014   Anemia, iron deficiency 05/20/2013   MENOPAUSAL DISORDER 12/07/2009   INSOMNIA 09/24/2008   ALLERGIC RHINITIS 06/24/2007   GERD 06/24/2007   COLONIC POLYPS, HX OF 06/24/2007   Hyperlipidemia with target LDL less than 70 10/08/2006   Depression 10/08/2006   Essential hypertension 10/08/2006   PEPTIC ULCER DISEASE 10/08/2006   Type 2 diabetes mellitus with hyperglycemia, without long-term current use of insulin ([place]) 10/05/2006   Morbid obesity ([place]) 10/05/2006   Conditions to be addressed/monitored:  CHF and DMII  Care Plan : RN Care Manager Plan of Care  Updates made by Knox Royalty, RN since 01/19/2021 12:00 AM     Problem: Chronic Disease Management Needs   Priority: High     Long-Range Goal: Development of plan of care for long term chronic disease management   Start Date: 11/17/2020  Expected End Date: 11/17/2021  Priority: High  Note:   Current  Barriers:  Chronic Disease Management support and education needs related to CHF and DMII Lives with son- who works during daytime hours: alone during day, limited stamina to perform ADL's/ iADL's after multiple recent hospitalizations Confusion around multiple new medications, post- recent hospitalizations- 12/06/20- confirmed patient has spoken with CCM Pharmacy team 12/06/20: Patient has received home oxygen, however, she reports not using: states oxygenator is "too loud" and keeps her awake: 01/19/21: Now resolved 12/20/20-- reports unable to get through to [facility]: care coordination outreach placed on patient's behalf 01/19/21: reports [employer] came out to her home after last care coordination outreach and replaced the oxygenator- reports "it is much better and much more quiet; I am using it occasionally" Multiple recent inpatient hospitalizations: 01/19/21: confirmed no new/ recent hospital or ED visits since last discharge 11/04/20 August 22-26, 2022: CHF September 12-22, 2022: AF with RVR; CHF; TAVR September 26-29, 2022: N/V, AKI  RNCM Clinical Goal(s):  Patient will verbalize basic understanding of  CHF and DMII disease process and self health management plan CHF; A-Fib; DMII  through collaboration with RN Care manager, provider, and care team.   Interventions: 1:1 collaboration with primary care provider regarding development and update of comprehensive plan of care as evidenced by provider attestation and co-signature Inter-disciplinary care team collaboration (see longitudinal plan of care) Evaluation of current treatment plan related to  self management and patient's adherence to plan as established by provider  Heart Failure Interventions:  (Status: Goal on Track (progressing): YES.) Discussed importance of daily weight and advised patient to weigh and record daily Reviewed role of diuretics in prevention of fluid overload and management of heart failure Discussed the importance of  keeping all appointments with provider Reviewed recent weights at home: reports consistently between 186-189, with weight this morning of "188.9 lbs;" denies weight gain > 3 lbs overnight/ 5 lbs in one week Reinforced previously provided education monitoring daily weights/ breathing status at home, and to follow action plan for yellow CHF zone: patient continues to require ongoing reinforcement/ support Discussed use of home oxygen at home: reports not having to use frequently use- "mainly at night, but just occasionally;" states she believes her breathing is at "baseline" Discussed medications with patient: she denies concerns, however, remains confused around several medications and continues to verbalize that she believes she is taking "too many" medications; she continues to wonder if the side effects of the medications are causing some of her health problems-- she would like for her medications to be reviewed and "consolidated" if possible-- encouraged her to discuss with CCM Pharmacist, who is scheduled to call her tomorrow Reports her son had recent sinus infection: she distanced from son and did not get sick-- however, she is asking for advice as to what she should take should OTC in the future "if" she gets a head cold; I encouraged her to discuss with CCM Pharmacist, who is scheduled to call her tomorrow; also suggested that she could ask outpatient pharmacy she uses, if necessary, if this occurs in the future; I advised that with her recent multiple hospital visits/ clinical conditions that should she become sick-- it is highly advisable for her to schedule urgent appointment/ office visit with PCP: she greatly wishes to avoid hospitalizations in the future if possible Reports "anxiety better;" states that she continues taking mirtazapine and "it really helps me rest at night;" -- questions if she has a prescription for this medication: I do not see this medication on her current medication list:  was able to eventually discover that this medication had been removed during cardiology provider office visit-- due to patient report that she had completed taking; patient states this is not correct: states that she has been taking "all along;" she requests refill of this medication if Dr. Jenny Reichmann agrees Reports having to use laxatives on ongoing basis: reports using "about every day;" she wonders if this is "safe" Will send message to [facility] team and PCP regarding patient's medication concerns/ questions Confirms no recent signs/ symptoms A-Fib-- signs/ symptoms along with corresponding action plan reinforced Reviewed upcoming provider office visit with patient: 01/20/21- CCM Pharmacist; 01/28/21- CCM CSW team; 02/16/21- PCP office visit;   Diabetes:  (Status: Goal on Track (progressing): YES.) Lab Results  Component Value Date   HGBA1C 5.8 (H) 10/26/2020  Discussed plans with patient for ongoing care management follow up and provided patient with direct contact information for care management team;      Review of patient status, including review  of consultants reports, relevant laboratory and other test results, and medications completed;       Confirmed patient continues monitoring/ recording blood sugars at home BID: fasting and post-prandial Reviewed with patient recent blood sugars at home: verbalizes concern that her blood sugars "have been running much higher than they normally do;" reports consistent fasting and post-prandial blood sugar values between 160-184, when they had previously been running 120-165; reports she has independently increased her metformin and is now taking 1000 mg q am and 500 mg q pm: encouraged her to discuss with CCM Pharmacist during tomorrow's appointment; medication list in EHR updated according to patient report today Confirmed no recent low blood sugars <80  Patient Goals/Self-Care Activities: As evidenced by review of EHR, collaboration with care team, and  patient reporting during CCM RN CM outreach,  Patient Mary Mccarthy will: Take medications as prescribed   Attend all scheduled provider appointments  Call pharmacy for medication refills  Call provider office for new concerns or questions  Continue to follow heart healthy, low salt, carbohydrate modified, low sugar diet Continue to monitor and record fasting and afternoon blood sugars at home daily Continue to monitor and record daily weights at home: you reported a weight this morning of 188.9 lbs Use home O2 as prescribed-- as needed for episodes of shortness of breath Maintain contact with Clinical Social Worker and Pharmacist at Dr. Gwynn Burly office    Plan: Telephone follow up appointment with care management team member scheduled for:  Monday, February 21, 2021 at 11:30 am The patient has been provided with contact information for the care management team and has been advised to call with any health related questions or concerns   [name], RN, BSN, Person [phone]: direct office

## 2021-01-20 ENCOUNTER — Other Ambulatory Visit: Payer: Self-pay

## 2021-01-20 ENCOUNTER — Ambulatory Visit: Payer: Medicare Other

## 2021-01-20 DIAGNOSIS — E1165 Type 2 diabetes mellitus with hyperglycemia: Secondary | ICD-10-CM

## 2021-01-20 DIAGNOSIS — I5032 Chronic diastolic (congestive) heart failure: Secondary | ICD-10-CM

## 2021-01-20 NOTE — Progress Notes (Signed)
Chronic Care Management Pharmacy Note  01/21/2021 Name:  Mary Mccarthy MRN:  [id number] DOB:  08-22-1940  Summary: -Patient reports that she is no longer taking citalopram as it caused her to feel nauseated  -Has been using xanax as needed, continues with mirtazapine at bedtime to help with her mood / insomnia and feels this has been helpful  -Reports that she self increased metformin dosage due to rising blood sugars (~160-170)- currently is taking $RemoveB'1000mg'xitfMEvG$  in the AM and $Remo'500mg'dUCHX$  in the PM -At most recent cardiology visit losartan was increased to $RemoveBefo'50mg'UCkCopDDHIL$  daily - has not been able to check BP since increase as she does not have a BP cuff    Recommendations/Changes made from today's visit: -Recommended for patient to reduce metformin dosage to $RemoveB'500mg'JqpKcSyn$  BID due to eGFR (<45) - would recommend close monitoring of kidney function as last Scr was 1.42 - should Scr >1.5 would recommend for reduction in eliquis dosing to 2.$RemoveBef'5mg'uhVnBkAAum$  BID  -Reviewed with patient to avoid NSAIDs as she is taking eliquis, patient was advised to use coricidin products for cough and cold as they are intended for patients with HTN and would be safest for patient   Subjective: Mary Mccarthy is an 80 y.o. year old female who is a primary patient of Jenny Reichmann, Hunt Oris, MD.  The CCM team was consulted for assistance with disease management and care coordination needs.    Engaged with patient by telephone for follow up visit in response to provider referral for pharmacy case management and/or care coordination services.   Consent to Services:  The patient was given the following information about Chronic Care Management services today, agreed to services, and gave verbal consent: 1. CCM service includes personalized support from designated clinical staff supervised by the primary care provider, including individualized plan of care and coordination with other care providers 2. 24/7 contact phone numbers for assistance for urgent and routine care  needs. 3. Service will only be billed when office clinical staff spend 20 minutes or more in a month to coordinate care. 4. Only one practitioner may furnish and bill the service in a calendar month. 5.The patient may stop CCM services at any time (effective at the end of the month) by phone call to the office staff. 6. The patient will be responsible for cost sharing (co-pay) of up to 20% of the service fee (after annual deductible is met). Patient agreed to services and consent obtained.  Patient Care Team: Biagio Borg, MD as PCP - General (Internal Medicine) Leonie Man, MD as PCP - Cardiology (Cardiology) Knox Royalty, RN as Case Manager Lenord Fellers, Cleaster Corin, [facility] as Pharmacist (Pharmacist) Deirdre Peer, LCSW as Social Worker (Licensed Clinical Social Worker)  Recent office visits: 11/22/2020 - Dr. Jenny Reichmann - televideo visit - anxiety - stopped celexa due to nausea after 1 dose - to take mirtazapine $RemoveBeforeDE'15mg'UVlxUKnvqHzAuZl$  at bedtime   Recent consult visits: 11/29/2020 - Dr. Ellyn Hack - Cardiology - status post TAVR - increased losartan to $RemoveBef'50mg'OzkrVWbnXN$  daily - PFTs scheduled for 03/2021 - follow up in 5 months   11/25/2020 - Angelena Form PA-C - Cardiology - no changes to medications   Hospital visits: Medication Reconciliation was completed by comparing discharge summary, patients EMR and Pharmacy list, and upon discussion with patient.  Admitted to the hospital on 11/01/20 due to AKI. Discharge date was 11/04/20. Discharged from [facility].   -Readmission after TAVR 9/22 for persistent n/v, AKI. Improved w/ IVF.  New?Medications Started at  Hospital Discharge:?? -started metoprolol 12.5 mg, losartan 25 mg  Medication Changes at Hospital Discharge: -Changed metformin to 500 mg BID  Medications Discontinued at Hospital Discharge: -Stopped glimepiride, hydralazine  Medications that remain the same after Hospital Discharge:??  -All other medications will remain the same.     Admitted to  the hospital on 10/18/20 due to GI bleed, TAVR. Discharge date was 10/28/20. Discharged from [place]?Medications Started at [facility] Discharge:?? -started pantoprazole 40 mg BID x 8 weeks, then daily due to GI bleed -amlodipine 10 mg, hydralazine 100 mg TID, potassium 10 meq  Medication Changes at Hospital Discharge: -Changed furosemide to 40 mg daily  Medications Discontinued at Hospital Discharge: -Stopped carvedilol (due to heart block) and aspirin (due to GI Bleed)  Medications that remain the same after Hospital Discharge:??  -All other medications will remain the same.    Medication Reconciliation was completed by comparing discharge summary, patients EMR and Pharmacy list, and upon discussion with patient.  Admitted to the hospital on 09/27/20 due to acute HF. Discharge date was 10/01/20. Discharged from [facility].   -improved with IV lasix -R and LHC performed, nonobstructive disease -Afib w/ RVR improved with IV amio  New?Medications Started at [facility] Discharge:?? -started amiodarone due to Afib, aspirin due to nonobstructive CAD  Medications that remain the same after Hospital Discharge:??  -All other medications will remain the same.    Objective:  Lab Results  Component Value Date   CREATININE 1.42 (H) 01/05/2021   BUN 26 01/05/2021   GFR 43.16 (L) 10/13/2020   GFRNONAA 56 (L) 11/03/2020   GFRAA 57 (L) 10/01/2019   NA 139 01/05/2021   K 4.3 01/05/2021   CALCIUM 10.0 01/05/2021   CO2 25 01/05/2021   GLUCOSE 197 (H) 01/05/2021    Lab Results  Component Value Date/Time   HGBA1C 5.8 (H) 10/26/2020 03:42 AM   HGBA1C 7.4 (H) 08/10/2020 04:18 PM   GFR 43.16 (L) 10/13/2020 02:18 PM   GFR 58.19 (L) 10/06/2020 11:10 AM   MICROALBUR 1.8 03/18/2020 11:25 AM   MICROALBUR 5.7 (H) 09/11/2019 09:44 AM    Last diabetic Eye exam:  Lab Results  Component Value Date/Time   HMDIABEYEEXA No Retinopathy 07/16/2018 12:00 AM    Last diabetic  Foot exam: No results found for: HMDIABFOOTEX   Lab Results  Component Value Date   CHOL 97 08/11/2020   HDL 32 (L) 08/11/2020   LDLCALC 48 08/11/2020   TRIG 86 08/11/2020   CHOLHDL 3.0 08/11/2020    Hepatic Function Latest Ref Rng & Units 01/05/2021 11/01/2020 11/01/2020  Total Protein 6.0 - 8.5 g/dL 7.0 7.1 6.7  Albumin 3.6 - 4.6 g/dL 4.2 3.6 4.0  AST 0 - 40 IU/L 17 32 27  ALT 0 - 32 IU/L $Remov'10 21 15  'wfdHdg$ Alk Phosphatase 44 - 121 IU/L 52 49 61  Total Bilirubin 0.0 - 1.2 mg/dL 0.4 1.0 0.7  Bilirubin, Direct 0.0 - 0.3 mg/dL - - -    Lab Results  Component Value Date/Time   TSH 5.820 (H) 11/11/2020 02:57 PM   TSH 3.422 08/11/2020 03:12 AM   TSH 4.92 (H) 03/18/2020 11:25 AM    CBC Latest Ref Rng & Units 11/11/2020 11/03/2020 11/02/2020  WBC 3.4 - 10.8 x10E3/uL 4.6 6.7 4.7  Hemoglobin 11.1 - 15.9 g/dL 9.3(L) 8.1(L) 8.0(L)  Hematocrit 34.0 - 46.6 % 28.0(L) 26.0(L) 24.6(L)  Platelets 150 - 450 x10E3/uL 296 219 201    Lab Results  Component Value Date/Time   VD25OH 14.34 (L) 03/18/2020 11:25 AM   VD25OH 23.26 (L) 09/11/2019 09:44 AM    Clinical ASCVD: Yes  The ASCVD Risk score (Arnett DK, et al., 2019) failed to calculate for the following reasons:   The 2019 ASCVD risk score is only valid for ages 65 to 39    Depression screen PHQ 2/9 12/01/2020 03/18/2020 09/16/2019  Decreased Interest 0 0 0  Down, Depressed, Hopeless 1 1 1   PHQ - 2 Score 1 1 1   Altered sleeping - - 0  Tired, decreased energy - - 0  Change in appetite - - 0  Feeling bad or failure about yourself  - - 0  Trouble concentrating - - 0  Moving slowly or fidgety/restless - - 0  Suicidal thoughts - - 0  PHQ-9 Score - - 1  Difficult doing work/chores - - -  Some recent data might be hidden     CHA2DS2-VASc Score = 8  The patient's score is based upon: CHF History: 0 HTN History: 1 Diabetes History: 1 Stroke History: 2 Vascular Disease History: 1 Age Score: 2 Gender Score: 1       Social History    Tobacco Use  Smoking Status Never  Smokeless Tobacco Never   BP Readings from Last 3 Encounters:  11/29/20 (!) 146/62  11/25/20 (!) 144/62  11/16/20 132/60   Pulse Readings from Last 3 Encounters:  11/29/20 (!) 55  11/25/20 60  11/16/20 63   Wt Readings from Last 3 Encounters:  11/29/20 195 lb (88.5 kg)  11/25/20 191 lb 4.8 oz (86.8 kg)  11/16/20 193 lb (87.5 kg)   BMI Readings from Last 3 Encounters:  11/29/20 36.84 kg/m  11/25/20 36.15 kg/m  11/16/20 36.47 kg/m    Assessment/Interventions: Review of patient past medical history, allergies, medications, health status, including review of consultants reports, laboratory and other test data, was performed as part of comprehensive evaluation and provision of chronic care management services.   SDOH:  (Social Determinants of Health) assessments and interventions performed: Yes  SDOH Screenings   Alcohol Screen: Not on file  Depression (PHQ2-9): Low Risk    PHQ-2 Score: 1  Financial Resource Strain: Not on file  Food Insecurity: No Food Insecurity   Worried About Charity fundraiser in the Last Year: Never true   Ran Out of Food in the Last Year: Never true  Housing: Not on file  Physical Activity: Not on file  Social Connections: Not on file  Stress: Not on file  Tobacco Use: Low Risk    Smoking Tobacco Use: Never   Smokeless Tobacco Use: Never   Passive Exposure: Not on file  Transportation Needs: No Transportation Needs   Lack of Transportation (Medical): No   Lack of Transportation (Non-Medical): No    CCM Care Plan  Allergies  Allergen Reactions   Ibuprofen Other (See Comments)    Bleeding events    Medications Reviewed Today     Reviewed by Tomasa Blase, [facility] (Pharmacist) on 01/20/21 at 1106  Med List Status: <None>   Medication Order Taking? Sig Documenting Provider Last Dose Status Informant  acetaminophen (TYLENOL) 500 MG tablet [id number] Yes Take 500 mg by mouth every 6 (six) hours as  needed for moderate pain. [provider] Taking Active Self  ALPRAZolam Duanne Moron) 0.25 MG tablet [id number] Yes TAKE 1 TABLET BY MOUTH THREE TIMES A DAY AS NEEDED FOR ANXIETY Biagio Borg, MD Taking Active   amiodarone (PACERONE)  200 MG tablet [id number] Yes Take 1 tablet (200 mg total) by mouth daily. Janetta Hora, PA-C Taking Active Self  amLODipine (NORVASC) 10 MG tablet [id number] Yes Take 1 tablet (10 mg total) by mouth daily. Lewie Chamber, MD Taking Active   apixaban (ELIQUIS) 5 MG TABS tablet [id number] Yes Take 1 tablet (5 mg total) by mouth 2 (two) times daily. Corwin Levins, MD Taking Active Self  atorvastatin (LIPITOR) 40 MG tablet [id number] Yes TAKE 1 TABLET BY MOUTH EVERY DAY [name], MD Taking Active Self  blood glucose meter kit and supplies [id number]  Dispense based on patient and insurance preference. Use up to four times daily as directed. E11.9 Corwin Levins, MD  Active Self  Blood Glucose Monitoring Suppl (ONE TOUCH ULTRA 2) w/Device Andria Rhein [id number]  Use as directed daily Corwin Levins, MD  Active Self  cyanocobalamin ([facility] VITAMIN B12) 1000 MCG tablet [id number] Yes TAKE 1 TABLET BY MOUTH EVERY DAY Corwin Levins, MD Taking Active   furosemide (LASIX) 40 MG tablet [id number] Yes Take 1 tablet (40 mg total) by mouth daily. Janetta Hora, PA-C Taking Active Self  Lancets [facility] [place] PLUS [place]) MISC [id number]  USE AS DIRECTED TEST 1-2 TIMES A DAY Corwin Levins, MD  Active Self  losartan (COZAAR) 50 MG tablet [id number] Yes Take 1 tablet (50 mg total) by mouth daily. [name], MD Taking Active   metFORMIN (GLUCOPHAGE) 1000 MG tablet [id number] Yes TAKE 1 TAB BY MOUTH IN THE AM,AND 1/2 TAB IN THE PM  Patient taking differently: TAKE 1/2 TAB(500 mg) BY MOUTH IN THE AM, AND 1/4 TAB(250 mg) IN THE PM   Corwin Levins, MD Taking Active            Med Note Otelia Limes Jan 19, 2021 11:45 AM) 01/19/21- reports taking 1000 mg po q am; 500 mg  qHS- patient has independently increased due to increasing blood sugars   metoprolol succinate (TOPROL-XL) 25 MG 24 hr tablet [id number] Yes Take 0.5 tablets (12.5 mg total) by mouth daily. Lewie Chamber, MD Taking Active   mirtazapine (REMERON) 15 MG tablet [id number] Yes Take 15 mg by mouth at bedtime. Corwin Levins, MD Taking Active Self  [facility] ULTRA test strip [id number]  USE AS INSTRUCTED TEST 1-2 TIMES A DAY Corwin Levins, MD  Active Self  pantoprazole (PROTONIX) 40 MG tablet [id number] Yes TAKE 1 TABLET BY MOUTH TWICE A DAY Janetta Hora, PA-C Taking Active   potassium chloride SA (KLOR-CON) 20 MEQ tablet [id number] Yes Take 1 tablet (20 mEq total) by mouth daily. Janetta Hora, PA-C Taking Active Self            Patient Active Problem List   Diagnosis Date Noted   Long term current use of amiodarone for rate-rhythm control of A. fib 11/28/2020   Chronic respiratory failure (HCC) 11/22/2020   Dehydration 11/02/2020   Diabetes mellitus type 2 in obese (HCC) 11/02/2020   AKI (acute kidney injury) (HCC) 11/01/2020   Transient post TAVR CHB -> not seen on follow-up Zio patch 10/28/2020   S/P TAVR (transcatheter aortic valve replacement) 10/26/2020   Melena 10/20/2020   Symptomatic anemia 10/19/2020   Acute bronchitis 09/21/2020   Current use of long term anticoagulation - for Afib; CHA2DS2-VASc 9, on Eliquis 08/23/2020   PAF with RVR: CHA2DS2-VASc 9.  On Eliquis and amiodarone. 08/11/2020   History of CVA (cerebrovascular accident) 08/10/2020  Hypomagnesemia 08/10/2020   Severe aortic stenosis 08/2020   Aortic atherosclerosis ([place]) 03/18/2020   B12 deficiency 09/21/2019   Cough 03/14/2018   Wheezing 03/14/2018   Anxiety 09/11/2017   Osteoporosis 03/10/2016   Coronary artery disease, non-occlusive 05/24/2015   GI bleed    Pulmonary hypertension (HCC)    CHF (congestive heart failure), NYHA class II, chronic, diastolic ([place]) 44/96/7591   Gastric ulceration     AVM (arteriovenous malformation) of colon    Demand ischemia of myocardium ([place])    Right knee pain 05/21/2014   Anemia, iron deficiency 05/20/2013   MENOPAUSAL DISORDER 12/07/2009   INSOMNIA 09/24/2008   ALLERGIC RHINITIS 06/24/2007   GERD 06/24/2007   COLONIC POLYPS, HX OF 06/24/2007   Hyperlipidemia with target LDL less than 70 10/08/2006   Depression 10/08/2006   Essential hypertension 10/08/2006   PEPTIC ULCER DISEASE 10/08/2006   Type 2 diabetes mellitus with hyperglycemia, without long-term current use of insulin ([place]) 10/05/2006   Morbid obesity ([place]) 10/05/2006    Immunization History  Administered Date(s) Administered   Fluad Quad(high Dose 65+) 12/26/2018, 12/06/2019, 10/13/2020   Influenza Split 12/21/2010, 12/28/2011   Influenza Whole 12/01/2005, 03/26/2009, 12/07/2009   Influenza, High Dose Seasonal PF 11/19/2012, 11/18/2013, 02/18/2015, 12/28/2015, 12/07/2016, 10/17/2017   PFIZER(Purple Top)SARS-COV-2 Vaccination 05/01/2019, 05/26/2019   Pneumococcal Conjugate-13 12/03/2012   Pneumococcal Polysaccharide-23 11/06/2005   Td 09/24/2008   Tdap 09/13/2018, 09/13/2018    Conditions to be addressed/monitored:  Hypertension, Hyperlipidemia, Diabetes, Atrial Fibrillation, Heart Failure, Coronary Artery Disease, and Anxiety  Care Plan : CCM Pharmacy Care Plan  Updates made by Tomasa Blase, RPH since 01/21/2021 12:00 AM     Problem: Hypertension, Hyperlipidemia, Diabetes, Atrial Fibrillation, Heart Failure, Coronary Artery Disease, and Anxiety   Priority: High     Long-Range Goal: Disease management   Start Date: 11/22/2020  Expected End Date: 11/22/2021  This Visit's Progress: On track  Recent Progress: On track  Priority: High  Note:   Current Barriers:  Unable to independently monitor therapeutic efficacy  Pharmacist Clinical Goal(s):  Patient will achieve adherence to monitoring guidelines and medication adherence to achieve therapeutic  efficacy achieve control of anxiety as evidenced by patient report through collaboration with PharmD and provider.   Interventions: 1:1 collaboration with Biagio Borg, MD regarding development and update of comprehensive plan of care as evidenced by provider attestation and co-signature Inter-disciplinary care team collaboration (see longitudinal plan of care) Comprehensive medication review performed; medication list updated in electronic medical record  Hyperlipidemia: (LDL goal < 70) -Controlled - LDL is at goal; pt endorses compliance with statin and denies issues -Hx stroke (08/2020) and aortic atherosclerosis; coronary CTA w/o evidence of obstructive disease, calcium score 625 Lab Results  Component Value Date   LDLCALC 48 08/11/2020  -Current treatment: Atorvastatin 40 mg daily AM -Educated on Cholesterol goals; Benefits of statin for ASCVD risk reduction; -Recommended to continue current medication  Diabetes (A1c goal <7%) -Controlled - A1c is at goal; pt reports that she recently increased her dosage of metformin due to elevation in recent blood sugars  Lab Results  Component Value Date   HGBA1C 5.8 (H) 10/26/2020  -Current medications: Metformin 1000mg  in AM, and 500mg  in PM - (patient self increased dosage) -Medications previously tried: glimepiride   -Educated on A1c and blood sugar goals; Complications of diabetes including kidney damage, retinal damage, and cardiovascular disease; -Recommended to reduce metformin to 500mg  BID - as her eGFR is <45 mL/min daily dose should not exceed  $'1000mg'j$   -Patient to continue monitoring blood sugars and reach out should BG average >150 fasted / >180 post prandial   Atrial Fibrillation (Goal: prevent stroke and major bleeding) -Controlled - pt endorses compliance with medications as prescribed; she denies s/sx of bleeding -Dx 08/2020 in s/o acute CVA -Hx complete heart block, aortic stenosis (s/p TAVR 10/26/20) -CHADSVASC: 9 -Current  treatment: Amiodarone 200 mg daily AM Metoprolol succinate 25 mg - 1/2 tab daily Eliquis 5 mg BID -Counseled on increased risk of stroke due to Afib and benefits of anticoagulation for stroke prevention; bleeding risk associated with Eliquis and importance of self-monitoring for signs/symptoms of bleeding; avoidance of NSAIDs due to increased bleeding risk with anticoagulants; -Recommended to continue current medication  Heart Failure / Hypertension (Goal: BP < 130/80 and prevent exacerbations) -Controlled - pt reports compliance with medications as prescribed; she reports weighing herself daily - today wt 189 lbs, which is her dry weight; she denies swelling or shortness of breath -Current home BP/HR readings: no monitor at home -Last ejection fraction: 60-65% (Date: 10/26/20) -HF type: Diastolic -Current treatment: Amlodipine 10 mg daily Furosemide 40 mg daily AM Losartan 50 mg daily - HS Metoprolol succinate 25 mg - 1/2 tab daily [place] Con 20 mEq daily AM -Educated on Benefits of medications for managing symptoms and prolonging life; Importance of weighing daily; if you gain more than 3 pounds in one day or 5 pounds in one week, call cardiology; Proper diuretic administration and potassium supplementation; Importance of blood pressure control -Recommended to move amlodipine and metoprolol to bedtime  Depression/Anxiety (Goal: manage symptoms) -Improved- feels that she is doing better, has been taking xanax as needed (typically twice daily) feels that mirtazapine has been effective for her as well -Current treatment: Alprazolam 0.25 mg TID prn Mirtazapine $RemoveBeforeD'15mg'hbNMTSTDSOxmjU$  hs  -Medications previously tried/failed: clonazepam, citalopram -Connected with PCP for mental health support -Educated on Benefits of medication for symptom control; Benefits of cognitive-behavioral therapy with or without medication  -Recommended to continue current medications, patient to reach out should she feel mental health  is not improving/ under control    GI bleed (Goal: manage symptoms) -Controlled - pt endorses compliance with daily PPI; she denies s/sx of bleeding -Hx GI bleed 09/2020 in s/o Eliquis. Stopped aspirin at that time. -Current treatment  Pantoprazole 40 mg BID -Counseled on benefits of PPI for preventing GI bleed; advised to avoid NSAIDs -Recommended to continue current medication  Health Maintenance -Vaccine gaps: COVID booster -Current therapy:  Tylenol 500 mg BID Vitamin B12 1000 mcg daily -Recommended to continue current medication  Patient Goals/Self-Care Activities Patient will:  - take medications as prescribed, reduce metformin to $RemoveBefo'500mg'rNpNrTiReTs$  BID  focus on medication adherence by pill box weigh daily, and contact provider if weight gain of 5+ lbs in a week, contact cardiology         Medication Assistance: None required.  Patient affirms current coverage meets needs.  Compliance/Adherence/Medication fill history: Care Gaps: Eye exam (08/05/19)  Patient's preferred pharmacy is:  [facility]- [place], [place] - [address] [place] [address] Phone: [phone] Fax: [phone]  [id number] - [place], [place] [address]  [place] [address] Phone: [phone] Fax: [phone]  [facility] [address] [place] [address] Phone: [phone] Fax: [phone]  Uses pill box? Yes Pt endorses 100% compliance  We discussed: Current pharmacy is  preferred with insurance plan and patient is satisfied with pharmacy services Patient decided to: Continue current medication management strategy  Care Plan and Follow Up Patient Decision:  Patient agrees to Care Plan and Follow-up.  Plan: Telephone follow up appointment with care management team member scheduled for:  2 months The patient has been provided  with contact information for the care management team and has been advised to call with any health related questions or concerns.    Tomasa Blase, PharmD Clinical Pharmacist, [place]

## 2021-01-21 NOTE — Patient Instructions (Signed)
Visit Information  Following are the goals we discussed today:   Manage My Medications   Timeframe:  Long-Range Goal Priority:  High Start Date:       11/22/20                      Expected End Date:    11/22/21                   Follow Up Date February 2023   - call for medicine refill 2 or 3 days before it runs out - call if I am sick and can't take my medicine - keep a list of all the medicines I take; vitamins and herbals too - use a pillbox to sort medicine  -weigh daily, and contact provider if weight gain of 5+ lbs in a week, contact cardiology   Why is this important?   These steps will help you keep on track with your medicines.  Plan: Telephone follow up appointment with care management team member scheduled for:  2 months The patient has been provided with contact information for the care management team and has been advised to call with any health related questions or concerns.   Ellin Saba, PharmD Clinical Pharmacist, [facility]   Please call the care guide team at [phone] if you need to cancel or reschedule your appointment.   The patient verbalized understanding of instructions, educational materials, and care plan provided today and declined offer to receive copy of patient instructions, educational materials, and care plan.

## 2021-01-24 DIAGNOSIS — I509 Heart failure, unspecified: Secondary | ICD-10-CM | POA: Diagnosis not present

## 2021-01-28 ENCOUNTER — Ambulatory Visit: Payer: Medicare Other | Admitting: *Deleted

## 2021-01-28 DIAGNOSIS — Z8673 Personal history of transient ischemic attack (TIA), and cerebral infarction without residual deficits: Secondary | ICD-10-CM

## 2021-01-28 DIAGNOSIS — E1165 Type 2 diabetes mellitus with hyperglycemia: Secondary | ICD-10-CM

## 2021-01-28 DIAGNOSIS — F419 Anxiety disorder, unspecified: Secondary | ICD-10-CM

## 2021-01-28 DIAGNOSIS — F5101 Primary insomnia: Secondary | ICD-10-CM

## 2021-01-28 NOTE — Chronic Care Management (AMB) (Signed)
Chronic Care Management    Clinical Social Work Note  01/28/2021 Name: Mary Mccarthy MRN: [id number] DOB: 25-Jan-1941  Mary Mccarthy is a 80 y.o. year old female who is a primary care patient of Jenny Reichmann, Hunt Oris, MD. The CCM team was consulted to assist the patient with chronic disease management and/or care coordination needs related to: [employer]  and [place] [facility] [employer].   Engaged with patient by telephone for follow up visit in response to provider referral for social work chronic care management and care coordination services.   Consent to Services:  The patient was given information about Chronic Care Management services, agreed to services, and gave verbal consent prior to initiation of services.  Please see initial visit note for detailed documentation.   Patient agreed to services and consent obtained.   Assessment: Review of patient past medical history, allergies, medications, and health status, including review of relevant consultants reports was performed today as part of a comprehensive evaluation and provision of chronic care management and care coordination services.     SDOH (Social Determinants of Health) assessments and interventions performed:    Advanced Directives Status: Not addressed in this encounter.  CCM Care Plan  Allergies  Allergen Reactions   Ibuprofen Other (See Comments)    Bleeding events    Outpatient Encounter Medications as of 01/28/2021  Medication Sig Note   acetaminophen (TYLENOL) 500 MG tablet Take 500 mg by mouth every 6 (six) hours as needed for moderate pain.    ALPRAZolam (XANAX) 0.25 MG tablet TAKE 1 TABLET BY MOUTH THREE TIMES A DAY AS NEEDED FOR ANXIETY    amiodarone (PACERONE) 200 MG tablet Take 1 tablet (200 mg total) by mouth daily.    amLODipine (NORVASC) 10 MG tablet Take 1 tablet (10 mg total) by mouth daily.    apixaban (ELIQUIS) 5 MG TABS tablet Take 1 tablet (5 mg total) by mouth 2 (two) times  daily.    atorvastatin (LIPITOR) 40 MG tablet TAKE 1 TABLET BY MOUTH EVERY DAY    blood glucose meter kit and supplies Dispense based on patient and insurance preference. Use up to four times daily as directed. E11.9    Blood Glucose Monitoring Suppl (ONE TOUCH ULTRA 2) w/Device KIT Use as directed daily    cyanocobalamin ([employer] VITAMIN B12) 1000 MCG tablet TAKE 1 TABLET BY MOUTH EVERY DAY    furosemide (LASIX) 40 MG tablet Take 1 tablet (40 mg total) by mouth daily.    Lancets (ONETOUCH DELICA PLUS [id number]) MISC USE AS DIRECTED TEST 1-2 TIMES A DAY    losartan (COZAAR) 50 MG tablet Take 1 tablet (50 mg total) by mouth daily.    metFORMIN (GLUCOPHAGE) 1000 MG tablet TAKE 1 TAB BY MOUTH IN THE AM,AND 1/2 TAB IN THE PM (Patient taking differently: TAKE 1/2 TAB(500 mg) BY MOUTH IN THE AM, AND 1/4 TAB(250 mg) IN THE PM) 01/19/2021: 01/19/21- reports taking 1000 mg po q am; 500 mg qHS- patient has independently increased due to increasing blood sugars    metoprolol succinate (TOPROL-XL) 25 MG 24 hr tablet Take 0.5 tablets (12.5 mg total) by mouth daily.    mirtazapine (REMERON) 15 MG tablet Take 15 mg by mouth at bedtime.    ONETOUCH ULTRA test strip USE AS INSTRUCTED TEST 1-2 TIMES A DAY    pantoprazole (PROTONIX) 40 MG tablet TAKE 1 TABLET BY MOUTH TWICE A DAY    potassium chloride SA (KLOR-CON) 20 MEQ tablet Take 1 tablet (20  mEq total) by mouth daily.    No facility-administered encounter medications on file as of 01/28/2021.    Patient Active Problem List   Diagnosis Date Noted   Long term current use of amiodarone for rate-rhythm control of A. fib 11/28/2020   Chronic respiratory failure ([place]) 11/22/2020   Dehydration 11/02/2020   Diabetes mellitus type 2 in obese ([place]) 11/02/2020   AKI (acute kidney injury) ([place]) 11/01/2020   Transient post TAVR CHB -> not seen on follow-up Zio patch 10/28/2020   S/P TAVR (transcatheter aortic valve replacement) 10/26/2020   Melena 10/20/2020    Symptomatic anemia 10/19/2020   Acute bronchitis 09/21/2020   Current use of long term anticoagulation - for Afib; CHA2DS2-VASc 9, on Eliquis 08/23/2020   PAF with RVR: CHA2DS2-VASc 9.  On Eliquis and amiodarone. 08/11/2020   History of CVA (cerebrovascular accident) 08/10/2020   Hypomagnesemia 08/10/2020   Severe aortic stenosis 08/2020   Aortic atherosclerosis ([place]) 03/18/2020   B12 deficiency 09/21/2019   Cough 03/14/2018   Wheezing 03/14/2018   Anxiety 09/11/2017   Osteoporosis 03/10/2016   Coronary artery disease, non-occlusive 05/24/2015   GI bleed    Pulmonary hypertension (HCC)    CHF (congestive heart failure), NYHA class II, chronic, diastolic ([place]) 90/24/0973   Gastric ulceration    AVM (arteriovenous malformation) of colon    Demand ischemia of myocardium (HCC)    Right knee pain 05/21/2014   Anemia, iron deficiency 05/20/2013   MENOPAUSAL DISORDER 12/07/2009   INSOMNIA 09/24/2008   ALLERGIC RHINITIS 06/24/2007   GERD 06/24/2007   COLONIC POLYPS, HX OF 06/24/2007   Hyperlipidemia with target LDL less than 70 10/08/2006   Depression 10/08/2006   Essential hypertension 10/08/2006   PEPTIC ULCER DISEASE 10/08/2006   Type 2 diabetes mellitus with hyperglycemia, without long-term current use of insulin ([place]) 10/05/2006   Morbid obesity ([place]) 10/05/2006    Conditions to be addressed/monitored: Anxiety and Depression; Mental Health Concerns   Care Plan : LCSW Plan of Care  Updates made by Deirdre Peer, LCSW since 01/28/2021 12:00 AM     Problem: Anxiety   Priority: High     Long-Range Goal: Reduce anxiety through support, services, medication and counseling   Start Date: 12/01/2020  Expected End Date: 03/07/2021  This Visit's Progress: On track  Recent Progress: On track  Priority: High  Note:   Current barriers:   Acute Mental Health needs related to anxiety with panic attacks Mental Health Concerns  Needs Support, Education, and Care Coordination in  order to meet unmet mental health needs. Clinical Goal(s): demonstrate a reduction in symptoms related to :Anxiety   Clinical Interventions:  01/28/21- pt reports taking all her meds as prescribed, staying "active/busy" and only feeling anxious at night some; "when I'm alone".  She denies any overall or significant concerns related to her mental health and states "I don't think I need the counseling".  CSW validated her thoughts and feelings and offered to call her in a few weeks to further assess and completed Depression/anxiety screenings before she sees PCP again (02/16/21)  12/29/20- Pt reports her anxiety is "much better" since our last visit. She is a bit frustrated about constipation- will ask [facility] and PCP to discuss further with her. Pt wonders if her RX are causing this; as well her CBG's have been higher and she has questions about altering these meds as well. Encouraged pt to take all meds as prescribed and CSW will alert PCP and [facility]PH.   Pt  does not feel she needs counseling now- discussed signs of some anxiety today with the above and she is not taking the xanax.  Pt will consider it some more and will discuss and reassess with CSW next month/visit.   and new onset anxiety that began after her recent hospitalization. "I was told I have PTSS- post traumatic stress syndrome". Pt reports having up to 4-5 panic attacks per day- she was given Xanax but has stopped taking it because it made her "jittery and drowsy".  Will ask PCP and Pharmacist to address this and possible alternative RX options.  Pt is agreeable to counseling and prefers it be tele-health as she is not driving at this time. Her son stays with her at night but she is alone during the day.  Pt agrees to counseling referral be placed with [facility]. Pt denies SI/HI- mild depression with low PHQ2 score.  Assessed patient's previous and current treatment, coping skills, support system and barriers to care  Depression screen reviewed   PHQ2/ PHQ9 completed Solution-Focused Strategies  Active listening / Reflection utilized  Problem [place] strategies reviewed Provided psychoeducation for mental health needs  Reviewed mental health medications with patient and discussed importance of compliance:  Participation in counseling encouraged  Discussed referral to [facility] to assist with connecting to mental health provider ; Review various resources, discussed options and provided patient information about  Options for mental health treatment based on need and insurance 1:1 collaboration with primary care provider regarding development and update of comprehensive plan of care as evidenced by provider attestation and co-signature Inter-disciplinary care team collaboration (see longitudinal plan of care) Patient Goals/Self-Care Activities: Over the next 45 days Discuss medication options with PCP   consider personal counseling - call and visit an old friend - check out volunteer opportunities - join a support group - laugh; watch a funny movie or comedian - perform a random act of kindness - practice relaxation or meditation daily - talk about feelings with a friend, family or spiritual advisor - practice positive thinking and self-talk   .         Follow Up Plan: Appointment scheduled for SW follow up with client by phone on: 02/14/21      Eduard Clos MSW, [place] Licensed Clinical Social Worker [place] [phone]

## 2021-01-28 NOTE — Patient Instructions (Signed)
Visit Information  Thank you for taking time to visit with me today. Please don't hesitate to contact me if I can be of assistance to you before our next scheduled telephone appointment.  Following are the goals we discussed today:  (Copy and paste patient goals from clinical care plan here)  Our next appointment is by telephone on 02/14/21   Please call the care guide team at [phone] if you need to cancel or reschedule your appointment.   If you are experiencing a Mental Health or Behavioral Health Crisis or need someone to talk to, please call the Suicide and Crisis Lifeline: 988 call the [place] National Suicide Prevention Lifeline: (407) 359-2236 or TTY: (234)066-8213 TTY 628-762-5431) to talk to a trained counselor call 1-800-273-TALK (toll free, 24 hour hotline) go to [facility] [address], [place] [phone]) call 911   The patient verbalized understanding of instructions, educational materials, and care plan provided today and declined offer to receive copy of patient instructions, educational materials, and care plan.   Reece Levy MSW, LCSW Licensed Clinical Social Worker [facility] [phone]

## 2021-02-02 ENCOUNTER — Other Ambulatory Visit: Payer: Self-pay

## 2021-02-02 DIAGNOSIS — I5032 Chronic diastolic (congestive) heart failure: Secondary | ICD-10-CM

## 2021-02-02 DIAGNOSIS — R0602 Shortness of breath: Secondary | ICD-10-CM

## 2021-02-02 DIAGNOSIS — I2510 Atherosclerotic heart disease of native coronary artery without angina pectoris: Secondary | ICD-10-CM

## 2021-02-02 DIAGNOSIS — I350 Nonrheumatic aortic (valve) stenosis: Secondary | ICD-10-CM

## 2021-02-02 DIAGNOSIS — Z79899 Other long term (current) drug therapy: Secondary | ICD-10-CM

## 2021-02-02 DIAGNOSIS — I442 Atrioventricular block, complete: Secondary | ICD-10-CM

## 2021-02-03 LAB — COMPREHENSIVE METABOLIC PANEL
ALT: 15 IU/L (ref 0–32)
AST: 20 IU/L (ref 0–40)
Albumin/Globulin Ratio: 1.4 (ref 1.2–2.2)
Albumin: 3.9 g/dL (ref 3.6–4.6)
Alkaline Phosphatase: 60 IU/L (ref 44–121)
BUN/Creatinine Ratio: 20 (ref 12–28)
BUN: 34 mg/dL — ABNORMAL HIGH (ref 8–27)
Bilirubin Total: 0.5 mg/dL (ref 0.0–1.2)
CO2: 22 mmol/L (ref 20–29)
Calcium: 10.2 mg/dL (ref 8.7–10.3)
Chloride: 101 mmol/L (ref 96–106)
Creatinine, Ser: 1.69 mg/dL — ABNORMAL HIGH (ref 0.57–1.00)
Globulin, Total: 2.7 g/dL (ref 1.5–4.5)
Glucose: 133 mg/dL — ABNORMAL HIGH (ref 70–99)
Potassium: 5 mmol/L (ref 3.5–5.2)
Sodium: 138 mmol/L (ref 134–144)
Total Protein: 6.6 g/dL (ref 6.0–8.5)
eGFR: 30 mL/min/{1.73_m2} — ABNORMAL LOW (ref >59)

## 2021-02-03 LAB — CBC WITH DIFFERENTIAL/PLATELET
Basophils Absolute: 0 10*3/uL (ref 0.0–0.2)
Basos: 0 %
EOS (ABSOLUTE): 0.1 10*3/uL (ref 0.0–0.4)
Eos: 2 %
Hematocrit: 24.5 % — ABNORMAL LOW (ref 34.0–46.6)
Hemoglobin: 7.6 g/dL — ABNORMAL LOW (ref 11.1–15.9)
Immature Grans (Abs): 0 10*3/uL (ref 0.0–0.1)
Immature Granulocytes: 0 %
Lymphocytes Absolute: 1.3 10*3/uL (ref 0.7–3.1)
Lymphs: 33 %
MCH: 25.7 pg — ABNORMAL LOW (ref 26.6–33.0)
MCHC: 31 g/dL — ABNORMAL LOW (ref 31.5–35.7)
MCV: 83 fL (ref 79–97)
Monocytes Absolute: 0.3 10*3/uL (ref 0.1–0.9)
Monocytes: 8 %
Neutrophils Absolute: 2.2 10*3/uL (ref 1.4–7.0)
Neutrophils: 57 %
Platelets: 233 10*3/uL (ref 150–450)
RBC: 2.96 x10E6/uL — ABNORMAL LOW (ref 3.77–5.28)
RDW: 15.9 % — ABNORMAL HIGH (ref 11.7–15.4)
WBC: 3.9 10*3/uL (ref 3.4–10.8)

## 2021-02-03 LAB — MAGNESIUM: Magnesium: 1.5 mg/dL — ABNORMAL LOW (ref 1.6–2.3)

## 2021-02-03 LAB — PRO B NATRIURETIC PEPTIDE: NT-Pro BNP: 623 pg/mL (ref 0–738)

## 2021-02-05 DIAGNOSIS — E1165 Type 2 diabetes mellitus with hyperglycemia: Secondary | ICD-10-CM | POA: Diagnosis not present

## 2021-02-05 DIAGNOSIS — I5032 Chronic diastolic (congestive) heart failure: Secondary | ICD-10-CM

## 2021-02-11 ENCOUNTER — Telehealth: Payer: Self-pay | Admitting: *Deleted

## 2021-02-11 ENCOUNTER — Other Ambulatory Visit: Payer: Self-pay

## 2021-02-11 ENCOUNTER — Other Ambulatory Visit: Payer: Self-pay | Admitting: Cardiology

## 2021-02-11 DIAGNOSIS — E785 Hyperlipidemia, unspecified: Secondary | ICD-10-CM

## 2021-02-11 DIAGNOSIS — E875 Hyperkalemia: Secondary | ICD-10-CM | POA: Diagnosis not present

## 2021-02-11 DIAGNOSIS — I2510 Atherosclerotic heart disease of native coronary artery without angina pectoris: Secondary | ICD-10-CM | POA: Diagnosis not present

## 2021-02-11 DIAGNOSIS — Z7901 Long term (current) use of anticoagulants: Secondary | ICD-10-CM | POA: Diagnosis not present

## 2021-02-11 DIAGNOSIS — I480 Paroxysmal atrial fibrillation: Secondary | ICD-10-CM | POA: Diagnosis not present

## 2021-02-11 DIAGNOSIS — N179 Acute kidney failure, unspecified: Secondary | ICD-10-CM

## 2021-02-11 DIAGNOSIS — Z79899 Other long term (current) drug therapy: Secondary | ICD-10-CM | POA: Diagnosis not present

## 2021-02-11 DIAGNOSIS — D649 Anemia, unspecified: Secondary | ICD-10-CM | POA: Diagnosis not present

## 2021-02-11 DIAGNOSIS — I5032 Chronic diastolic (congestive) heart failure: Secondary | ICD-10-CM | POA: Diagnosis not present

## 2021-02-11 DIAGNOSIS — Z952 Presence of prosthetic heart valve: Secondary | ICD-10-CM | POA: Diagnosis not present

## 2021-02-11 LAB — CBC WITH DIFFERENTIAL/PLATELET
Basophils Absolute: 0 10*3/uL (ref 0.0–0.2)
Basos: 1 %
EOS (ABSOLUTE): 0 10*3/uL (ref 0.0–0.4)
Eos: 1 %
Hematocrit: 25.2 % — ABNORMAL LOW (ref 34.0–46.6)
Hemoglobin: 7.6 g/dL — ABNORMAL LOW (ref 11.1–15.9)
Immature Grans (Abs): 0 10*3/uL (ref 0.0–0.1)
Immature Granulocytes: 0 %
Lymphocytes Absolute: 1.6 10*3/uL (ref 0.7–3.1)
Lymphs: 37 %
MCH: 25 pg — ABNORMAL LOW (ref 26.6–33.0)
MCHC: 30.2 g/dL — ABNORMAL LOW (ref 31.5–35.7)
MCV: 83 fL (ref 79–97)
Monocytes Absolute: 0.4 10*3/uL (ref 0.1–0.9)
Monocytes: 9 %
Neutrophils Absolute: 2.2 10*3/uL (ref 1.4–7.0)
Neutrophils: 52 %
Platelets: 246 10*3/uL (ref 150–450)
RBC: 3.04 x10E6/uL — ABNORMAL LOW (ref 3.77–5.28)
RDW: 15.9 % — ABNORMAL HIGH (ref 11.7–15.4)
WBC: 4.2 10*3/uL (ref 3.4–10.8)

## 2021-02-11 LAB — BASIC METABOLIC PANEL
BUN/Creatinine Ratio: 15 (ref 12–28)
BUN: 20 mg/dL (ref 8–27)
CO2: 24 mmol/L (ref 20–29)
Calcium: 9.7 mg/dL (ref 8.7–10.3)
Chloride: 102 mmol/L (ref 96–106)
Creatinine, Ser: 1.31 mg/dL — ABNORMAL HIGH (ref 0.57–1.00)
Glucose: 144 mg/dL — ABNORMAL HIGH (ref 70–99)
Potassium: 4.4 mmol/L (ref 3.5–5.2)
Sodium: 137 mmol/L (ref 134–144)
eGFR: 41 mL/min/{1.73_m2} — ABNORMAL LOW (ref >59)

## 2021-02-11 LAB — IRON AND TIBC
Iron Saturation: 5 % — CL (ref 15–55)
Iron: 19 ug/dL — ABNORMAL LOW (ref 27–139)
Total Iron Binding Capacity: 375 ug/dL (ref 250–450)
UIBC: 356 ug/dL (ref 118–369)

## 2021-02-11 LAB — FERRITIN: Ferritin: 18 ng/mL (ref 15–150)

## 2021-02-11 LAB — FOLATE: Folate: 15.7 ng/mL (ref >3.0)

## 2021-02-11 LAB — VITAMIN B12: Vitamin B-12: 1512 pg/mL — ABNORMAL HIGH (ref 232–1245)

## 2021-02-11 NOTE — Telephone Encounter (Signed)
-----   Message from [name], MD sent at 02/11/2021  1:31 PM EST ----- These are labs that must of been in the system the somebody also ordered.  I had not ordered a CBC, I did order a chemistry panel.  Somehow it did not come into my inbox.  These labs are brought to my attention, not through the standard result review section.  Little concerned about hemoglobin level of 7.6 not being addressed when this was seen.  We will contact her to have her come in and get some blood work done today with another CBC, if still low, would probably need ER evaluation.  If it is somewhat stable, she is due to see Dr. Jonny Ruiz on January 11.  But I would recommend probably holding Eliquis until this can be resolved.  Her kidney function does look a little worse.  Would recommend reducing losartan to 1/2 tablet.  And stop supplemental potassium.  Would like to have her come in so we can reassess both CBC and BMP along with an anemia panel today.  Bryan Lemma, MD

## 2021-02-11 NOTE — Telephone Encounter (Signed)
Spoke to patient.  Patient states she is feeling like her self again. Informed patient would like for to come have redrawn today again .  Informed patient last were abnormal .  CBC Levels were low.  And other lab values  Patient verbalized understanding .  RN asked if patient can come today for recheck of labs patient stated she would call family to see,if she had a ride.

## 2021-02-11 NOTE — Progress Notes (Unsigned)
erritin °

## 2021-02-11 NOTE — Telephone Encounter (Signed)
Patient called back . She will able to come to office today for labs, RN  also gave patient instruction concerning  medication per Dr Herbie Baltimore .  Hold Eliquis for now  Stop Potassium supplement Decrease Losartan to 1/2 tablet daily ( equal 25 mg )   RN  informed patient if labs were still  abnormal  she may receive  a call after hours to got ER for evaluation. She voiced understanding.

## 2021-02-12 ENCOUNTER — Telehealth: Payer: Self-pay | Admitting: Cardiology

## 2021-02-12 DIAGNOSIS — I480 Paroxysmal atrial fibrillation: Secondary | ICD-10-CM

## 2021-02-12 DIAGNOSIS — Z7901 Long term (current) use of anticoagulants: Secondary | ICD-10-CM

## 2021-02-12 DIAGNOSIS — D500 Iron deficiency anemia secondary to blood loss (chronic): Secondary | ICD-10-CM

## 2021-02-12 DIAGNOSIS — K279 Peptic ulcer, site unspecified, unspecified as acute or chronic, without hemorrhage or perforation: Secondary | ICD-10-CM

## 2021-02-12 NOTE — Telephone Encounter (Signed)
° °  Brief telephone call on Saturday, 02/12/2021-lab results reviewed.  It appears that she had a CBC that had been pending at the time when she came in for having basic chemistries checked on December 28 for follow-up.  The CBC was drawn in addition to the chemistry panel, but it was unclear who the ordering provider was.  Unfortunately the CBC showed that hemoglobin level was down to 7.6 from previous value of 9.3 and 8.1 before that.  This was performed back in October. Also noted potassium of 5.0 and creatinine of 1.69. Results were initially seen on 02/11/2021, the patient was contacted to come in for relook labs.   With slow drop anemia, we recommended stopping Eliquis.  Also reduced dose of losartan to 1/2 tablet (25 mg) and holding supplemental potassium. Labs were reviewed, and hemoglobin appears to be stable at 7.6, creatinine improved to 1.3 and potassium also improved.  I contacted her today on 02/12/2021.  She denies having any significant dyspnea or chest pain.  No sensation of irregular heartbeats or palpitations.  She still has her anxiety spells but these are better.  She also still has intermittent aches and pains for which she takes Tylenol.  Between taking as needed Tylenol and Xanax she is overall stabilized.  She has not noted any melena, hematochezia or hematuria.  No nosebleeds.  No active signs of bleeding.  We talked briefly about her lab results.  She was happy to hear the results of the kidney function.  Since she does not appear to be acutely ill from the drop in hemoglobin and this is not a dramatic drop from October where the levels have been 8.1 and then increased to 9.3 after initial treatment.  I suspect this is a slow bleed.  No change in 1 week is very reassuring.  As such, I do not feel that this is a requirement for ER evaluation.  She is due to see her PCP next week.  I did recommend that if she were to start having worsening dyspnea or irregular heartbeats or  palpitations or other concerning symptoms that she should indeed go to the emergency room.  She would prefer to avoid going to the emergency room, and I agree that this is not necessarily required at this time.  This can be followed in the outpatient setting next week.  Plan will be to schedule for IV iron infusion in the outpatient setting. Will continue to hold Eliquis for now and refer back to valve clinic to discuss the possibility of Watchman Device.  I have attached this note to the Valve Clinic Team.  Will also send copy of this note to Dr. Jenny Reichmann, her PCP.  Would probably be beneficial to reassess labs when she comes in for her appointment.  If he clinically feels that more aggressive treatment is needed, would defer to his judgment.   Glenetta Hew, MD  02/12/2021 4:26 PM

## 2021-02-12 NOTE — Progress Notes (Signed)
Labs reviewed.  I called her on February 12, 2021 to discuss results. . See phone note.  Glenetta Hew, MD

## 2021-02-14 ENCOUNTER — Ambulatory Visit (INDEPENDENT_AMBULATORY_CARE_PROVIDER_SITE_OTHER): Payer: Medicare Other | Admitting: Licensed Clinical Social Worker

## 2021-02-14 DIAGNOSIS — F32A Depression, unspecified: Secondary | ICD-10-CM

## 2021-02-14 DIAGNOSIS — F419 Anxiety disorder, unspecified: Secondary | ICD-10-CM

## 2021-02-14 NOTE — Patient Instructions (Signed)
Visit Information  Thank you for taking time to visit with me today. Please don't hesitate to contact me if I can be of assistance to you before our next scheduled telephone appointment.  Following are the goals we discussed today:  managing symptom for anxiety/ panic Patient Self-Care Activities: Continue with compliance of taking medication  Relaxed breathing 3 times daily   Please call the care guide team at [phone] if you need to cancel or reschedule your appointment.   If you are experiencing a Mental Health or Behavioral Health Crisis or need someone to talk to, please call 1-800-273-TALK (toll free, 24 hour hotline)   The patient verbalized understanding of instructions, educational materials, and care plan provided today and declined offer to receive copy of patient instructions, educational materials, and care plan.    Please call the office if needed No follow up scheduled, per our conversation you do not desire continued follow up Per our conversation I will remain part of your care team for the next 30 days.  If no needs are identified in the next 30 days, I will disconnect from the care team.  Mary Hines, LCSW Care Management & Coordination  [phone]

## 2021-02-14 NOTE — Chronic Care Management (AMB) (Signed)
Chronic Care Management  Clinical Social Work Note  02/14/2021 Name: Mary Mccarthy MRN: [id number] DOB: 02-27-1940  Mary Mccarthy is a 81 y.o. year old female who is a primary care patient of Jonny Ruiz, Len Blalock, MD. The CCM team was consulted for assistance with care coordination needs:  Panic attacks     Engaged with patient by telephone for follow up visit in response to provider referral for social care coordination services. Assessed patient's current treatment, progress, coping skills, support system and barriers to care.  Collaborated with previous CCM LCSW in order to meet patient's ongoing needs. Patient is making progress with managing symptoms, she is not interested in counseling or ongoing support at this time.  Reminded patient of all up coming appointments.   Consent to Services:  The patient was given information about Chronic Care Management services, agreed to services, and gave verbal consent prior to initiation of services.  Please see initial visit note for detailed documentation.   Assessment includes: Review of patient past medical history, allergies, medications, and health status, including review of pertinent consultant reports was performed as part of comprehensive evaluation and provision of care management/care coordination services.See Care Plan below for interventions and patient self-care actives.  SDOH (Social Determinants of Health) screening and interventions performed today:  SDOH Interventions    Flowsheet Row Most Recent Value  SDOH Interventions   Food Insecurity Interventions Other (Comment)  [has food stamps]  Financial Strain Interventions Other (Comment)  [gets food stamps and enhancede benefits]  Stress Interventions Provide Counseling       Advanced Directives Status:Not addressed in this encounter.     CCM Care Plan    Conditions to be addressed/monitored per PCP order: Anxiety and panic ,   Care Plan : LCSW Plan of Care  Updates made by Soundra Pilon, LCSW since 02/14/2021 12:00 AM     Problem: Anxiety   Priority: High     Long-Range Goal: Reduce anxiety through support, services, medication and counseling Completed 02/14/2021  Start Date: 12/01/2020  Expected End Date: 03/07/2021  This Visit's Progress: On track  Recent Progress: On track  Priority: High  Note:   Current Barriers:  Disease Management support and education needs related to Anxiety with Excessive Worry, Panic Symptoms,  CSW Clinical Goal(s):  Patient  demonstrate a reduction in symptoms related to :Anxiety with Panic Symptoms, and Panic Disorder   through collaboration with Clinical Child psychotherapist, provider, and care team.   Interventions: 1:1 collaboration with primary care provider regarding development and update of comprehensive plan of care as evidenced by provider attestation and co-signature Inter-disciplinary care team collaboration (see longitudinal plan of care) Evaluation of current treatment plan related to  self management and patient's adherence to plan as established by provider Review resources, discussed options and provided patient information about  Department of Social Services ( services provided ) Enhanced Benefits connected with insurance provider  Options for mental health treatment based on need and insurance  Mental Health:  (Status: Goal on Track (progressing): YES. Patient declined further engagement on this goal.)  Reports her symptoms have decreased and not interested in counseling at this time. Does not have panic attacks daily. Last one was yesterday. She is open to relaxation training which was discussed today. Evaluation of current treatment plan related to panic attacks and worry Motivational Interviewing employed Mindfulness or Relaxation training provided Active listening / Reflection utilized  Reviewed mental health medications with patient and discussed importance of compliance:  Participation in  counseling encouraged    Patient Self-Care Activities: Continue with compliance of taking medication  Relaxed breathing 3 times daily    Follow up Plan:  Patient does not desire continued follow-up by CCM LCSW. Will contact the office if needed CCM LCSW will continue to collaborate with CCM team  in order to meet patient's needs . Patient may benefit from and is in agreement for CCM LCSW to remain part of care team for the next 30 days.  If no needs are identified in the next 30 days, CCM LSCW will disconnect from the care team.   Sammuel Hines, LCSW Licensed Clinical Social Worker [facility]  [facility] [place]  [phone]

## 2021-02-16 ENCOUNTER — Encounter: Payer: Self-pay | Admitting: Internal Medicine

## 2021-02-16 ENCOUNTER — Telehealth: Payer: Self-pay

## 2021-02-16 ENCOUNTER — Other Ambulatory Visit: Payer: Self-pay

## 2021-02-16 ENCOUNTER — Ambulatory Visit (INDEPENDENT_AMBULATORY_CARE_PROVIDER_SITE_OTHER): Payer: Commercial Managed Care - HMO | Admitting: Internal Medicine

## 2021-02-16 VITALS — BP 122/60 | HR 54 | Temp 98.0°F | Ht 61.0 in | Wt 188.0 lb

## 2021-02-16 DIAGNOSIS — E538 Deficiency of other specified B group vitamins: Secondary | ICD-10-CM

## 2021-02-16 DIAGNOSIS — E785 Hyperlipidemia, unspecified: Secondary | ICD-10-CM | POA: Diagnosis not present

## 2021-02-16 DIAGNOSIS — E1165 Type 2 diabetes mellitus with hyperglycemia: Secondary | ICD-10-CM | POA: Diagnosis not present

## 2021-02-16 DIAGNOSIS — E559 Vitamin D deficiency, unspecified: Secondary | ICD-10-CM

## 2021-02-16 DIAGNOSIS — I700 Atherosclerosis of aorta: Secondary | ICD-10-CM

## 2021-02-16 DIAGNOSIS — F418 Other specified anxiety disorders: Secondary | ICD-10-CM | POA: Diagnosis not present

## 2021-02-16 DIAGNOSIS — D500 Iron deficiency anemia secondary to blood loss (chronic): Secondary | ICD-10-CM

## 2021-02-16 DIAGNOSIS — Z0001 Encounter for general adult medical examination with abnormal findings: Secondary | ICD-10-CM | POA: Diagnosis not present

## 2021-02-16 DIAGNOSIS — Z7901 Long term (current) use of anticoagulants: Secondary | ICD-10-CM | POA: Diagnosis not present

## 2021-02-16 LAB — URINALYSIS, ROUTINE W REFLEX MICROSCOPIC
Bilirubin Urine: NEGATIVE
Hgb urine dipstick: NEGATIVE
Ketones, ur: NEGATIVE
Nitrite: NEGATIVE
RBC / HPF: NONE SEEN (ref 0–?)
Specific Gravity, Urine: 1.01 (ref 1.000–1.030)
Total Protein, Urine: NEGATIVE
Urine Glucose: NEGATIVE
Urobilinogen, UA: 0.2 (ref 0.0–1.0)
pH: 5.5 (ref 5.0–8.0)

## 2021-02-16 LAB — HEPATIC FUNCTION PANEL
ALT: 12 U/L (ref 0–35)
AST: 18 U/L (ref 0–37)
Albumin: 3.9 g/dL (ref 3.5–5.2)
Alkaline Phosphatase: 43 U/L (ref 39–117)
Bilirubin, Direct: 0.1 mg/dL (ref 0.0–0.3)
Total Bilirubin: 0.6 mg/dL (ref 0.2–1.2)
Total Protein: 7.2 g/dL (ref 6.0–8.3)

## 2021-02-16 LAB — CBC WITH DIFFERENTIAL/PLATELET
Basophils Absolute: 0 10*3/uL (ref 0.0–0.1)
Basophils Relative: 0.4 % (ref 0.0–3.0)
Eosinophils Absolute: 0.1 10*3/uL (ref 0.0–0.7)
Eosinophils Relative: 1.5 % (ref 0.0–5.0)
HCT: 25.5 % — ABNORMAL LOW (ref 36.0–46.0)
Hemoglobin: 7.9 g/dL — CL (ref 12.0–15.0)
Lymphocytes Relative: 37.9 % (ref 12.0–46.0)
Lymphs Abs: 2.1 10*3/uL (ref 0.7–4.0)
MCHC: 30.9 g/dL (ref 30.0–36.0)
MCV: 80.5 fl (ref 78.0–100.0)
Monocytes Absolute: 0.5 10*3/uL (ref 0.1–1.0)
Monocytes Relative: 8.8 % (ref 3.0–12.0)
Neutro Abs: 2.9 10*3/uL (ref 1.4–7.7)
Neutrophils Relative %: 51.4 % (ref 43.0–77.0)
Platelets: 245 10*3/uL (ref 150.0–400.0)
RBC: 3.17 Mil/uL — ABNORMAL LOW (ref 3.87–5.11)
RDW: 17.6 % — ABNORMAL HIGH (ref 11.5–15.5)
WBC: 5.6 10*3/uL (ref 4.0–10.5)

## 2021-02-16 LAB — LIPID PANEL
Cholesterol: 92 mg/dL (ref 0–200)
HDL: 34.6 mg/dL — ABNORMAL LOW (ref >39.00)
LDL Cholesterol: 40 mg/dL (ref 0–99)
NonHDL: 57.33
Total CHOL/HDL Ratio: 3
Triglycerides: 88 mg/dL (ref 0.0–149.0)
VLDL: 17.6 mg/dL (ref 0.0–40.0)

## 2021-02-16 LAB — BASIC METABOLIC PANEL
BUN: 24 mg/dL — ABNORMAL HIGH (ref 6–23)
CO2: 30 mEq/L (ref 19–32)
Calcium: 9.9 mg/dL (ref 8.4–10.5)
Chloride: 102 mEq/L (ref 96–112)
Creatinine, Ser: 1.23 mg/dL — ABNORMAL HIGH (ref 0.40–1.20)
GFR: 40.96 mL/min — ABNORMAL LOW (ref >60.00)
Glucose, Bld: 120 mg/dL — ABNORMAL HIGH (ref 70–99)
Potassium: 4.1 mEq/L (ref 3.5–5.1)
Sodium: 138 mEq/L (ref 135–145)

## 2021-02-16 LAB — MICROALBUMIN / CREATININE URINE RATIO
Creatinine,U: 30.6 mg/dL
Microalb Creat Ratio: 2.3 mg/g (ref 0.0–30.0)
Microalb, Ur: <0.7 mg/dL (ref 0.0–1.9)

## 2021-02-16 LAB — VITAMIN D 25 HYDROXY (VIT D DEFICIENCY, FRACTURES): VITD: 22.16 ng/mL — ABNORMAL LOW (ref 30.00–100.00)

## 2021-02-16 LAB — TSH: TSH: 7.54 u[IU]/mL — ABNORMAL HIGH (ref 0.35–5.50)

## 2021-02-16 LAB — HEMOGLOBIN A1C: Hgb A1c MFr Bld: 7.5 % — ABNORMAL HIGH (ref 4.6–6.5)

## 2021-02-16 MED ORDER — LOSARTAN POTASSIUM 50 MG PO TABS: 25.0000 mg | ORAL_TABLET | Freq: Every day | ORAL | 3 refills | Status: DC

## 2021-02-16 MED ORDER — METFORMIN HCL 1000 MG PO TABS: ORAL_TABLET | ORAL | 3 refills | Status: DC

## 2021-02-16 MED ORDER — CITALOPRAM HYDROBROMIDE 10 MG PO TABS: 10.0000 mg | ORAL_TABLET | Freq: Every day | ORAL | 3 refills | Status: DC

## 2021-02-16 NOTE — Assessment & Plan Note (Signed)
currenlty holding eliquis

## 2021-02-16 NOTE — Assessment & Plan Note (Signed)
Lab Results  Component Value Date   VITAMINB12 1,512 (H) 02/11/2021   Stable, cont oral replacement - b12 1000 mcg qd

## 2021-02-16 NOTE — Telephone Encounter (Signed)
CRITICAL VALUE STICKER  CRITICAL VALUE: 7.9 Hgb and 25.5 Hct  RECEIVER (on-site recipient of call): Ladona Ridgel A. Clovis Riley, CMA  DATE & TIME NOTIFIED: 02/16/21 at 4:20p  MESSENGER (representative from lab): [name] at [employer]  MD NOTIFIED: yes  TIME OF NOTIFICATION: 4:22p  RESPONSE: waiting response

## 2021-02-16 NOTE — Assessment & Plan Note (Signed)
Also for f/u lab today, consider transfusion < 7

## 2021-02-16 NOTE — Assessment & Plan Note (Signed)
Lab Results  Component Value Date   LDLCALC 40 02/16/2021   Stable, pt to continue current statin lipitor 40

## 2021-02-16 NOTE — Assessment & Plan Note (Signed)
With recent mild worsening, for add celexa 10 qd, cont klonopin prn

## 2021-02-16 NOTE — Telephone Encounter (Signed)
The patient is scheduled 1/12 with Dr. Excell Seltzer for [facility] evaluation.

## 2021-02-16 NOTE — Progress Notes (Addendum)
Patient ID: Mary Mccarthy, female   DOB: 11/28/1940, 81 y.o.   MRN: [id number]         Chief Complaint:: wellness exam and Hypertension (Discuss low hemoglobin and iron) and Diabetes (F/u)         HPI:  Mary Mccarthy is a 81 y.o. female here for wellness exam; declines optho referral, shingrix, covid booster for now, o/w up to date                        Also to see cardiology tomorrow regrading possible watchman placement, currently holding eliquis since 5 days.  Also with worsening panic and anxiety recently with increased stressors, lives alone.  Also on reduced losartan 25 mg to avoid lower BP.  Pt denies chest pain, increased sob or doe, wheezing, orthopnea, PND, increased LE swelling, palpitations, dizziness or syncope.   Pt denies polydipsia, polyuria, or new focal neuro s/s.   Pt denies fever, wt loss, night sweats, loss of appetite, or other constitutional symptoms  no recent overt bleeding.  Recent Hgb 7.6   Wt Readings from Last 3 Encounters:  02/16/21 188 lb (85.3 kg)  11/29/20 195 lb (88.5 kg)  11/25/20 191 lb 4.8 oz (86.8 kg)   BP Readings from Last 3 Encounters:  02/16/21 122/60  11/29/20 (!) 146/62  11/25/20 (!) 144/62   Immunization History  Administered Date(s) Administered   Fluad Quad(high Dose 65+) 12/26/2018, 12/06/2019, 10/13/2020   Influenza Split 12/21/2010, 12/28/2011   Influenza Whole 12/01/2005, 03/26/2009, 12/07/2009   Influenza, High Dose Seasonal PF 11/19/2012, 11/18/2013, 02/18/2015, 12/28/2015, 12/07/2016, 10/17/2017   PFIZER(Purple Top)SARS-COV-2 Vaccination 05/01/2019, 05/26/2019   Pneumococcal Conjugate-13 12/03/2012   Pneumococcal Polysaccharide-23 11/06/2005   Td 09/24/2008   Tdap 09/13/2018, 09/13/2018   There are no preventive care reminders to display for this patient.     Past Medical History:  Diagnosis Date   ALLERGIC RHINITIS 06/24/2007   Anemia    AVM (arteriovenous malformation) of colon    Blood transfusion without reported  diagnosis 05/2015   COLONIC POLYPS, HX OF 06/24/2007   Coronary artery disease, non-occlusive 05/2015   Trop + w/ Acute Anemia =>CATH: small RI - Ostial 60%, ostial RCA 30% and dLAD 40-50%;; 9/'21: Cor Ca Score 624. Mild (25-49%) prox RCA & LAD,; Moderate (50-69%) Ostial Small RI & prox LCx.     DEPRESSION 10/08/2006   DIABETES MELLITUS, TYPE II 10/05/2006   GERD 06/24/2007   History of CVA (cerebrovascular accident) 08/10/2020   08/2020- found to be in afib with RVR, started on Eliquis (shower of emboli from A. fib as complication of severe AS)   HYPERLIPIDEMIA 10/08/2006   HYPERTENSION 10/08/2006   INSOMNIA 09/24/2008   Left knee DJD    Nonsustained paroxysmal ventricular tachycardia 10/2018   Post-TAVR Zio Patch: Frequent (206) runs of NSVT - Fastest 5 beats 239 bpm. Longest 9.3 Sec avg 135 bpm. (? possibly Afib w/ aberrancy)   Osteoporosis 03/10/2016   PAF (paroxysmal atrial fibrillation) (HCC)    PAF with RVR: CHA2DS2-VASc 9.  On Eliquis and amiodarone. 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 Angelena Form & Dr. Cyndia Bent   Severe aortic stenosis 08/2020   Progression from Mod-Severe AS to Severe AS - noted on Echo 08/2020 -> (mean gradient progressed from 34 to 42 mmHg) this was in setting of  new onset A. fib RVR, acute diastolic HF and shower of emboli CVA. ->  Referred for TAVR, completed 10/26/2020   Past Surgical History:  Procedure Laterality Date   APPENDECTOMY     BIOPSY  10/21/2020   Procedure: BIOPSY;  Surgeon: Sharyn Creamer, MD;  Location: [facility] ENDOSCOPY;  Service: Gastroenterology;;   BREAST BIOPSY     CARDIAC CATHETERIZATION N/A 05/21/2015   Procedure: Left Heart Cath and Coronary Angiography;  Surgeon: Leonie Man, MD;  Location: [place] [facility];  Service: Cardiovascular;: Ost RI 60%, Ost RCA 30%, dLAD tapers to small vessel w/  40-50%. Mildly elevated LVEDP. Normal LV Fxn.   COLONOSCOPY N/A 05/20/2015   Procedure: COLONOSCOPY;  Surgeon: Manus Gunning, MD;  Location: [place];  Service: Gastroenterology;  Laterality: N/A;   CORONARY CA2+ SCORE / CARDIAC CT ANGIOGRAM  10/09/2019   Calcium score 624.  82nd percentile. Dominant RCA: Mild (25-49%) proximal stenosis-distal bifurcation into PDA and PAV--< RPL branches.  LAD (1 major mid vessel diagonal) diffuse calcified plaque, mild proximal stenosis with minimal distal stenosis.  Small RI moderate ostial disease.  LCx-moderate mixed (50-69%) proximal stenosis.  Small dOM1 disease.  Trileaflet AoV, annular Ca2+ - probable AS   ESOPHAGOGASTRODUODENOSCOPY N/A 05/20/2015   Procedure: ESOPHAGOGASTRODUODENOSCOPY (EGD);  Surgeon: Manus Gunning, MD;  Location: [place];  Service: Gastroenterology;  Laterality: N/A;   ESOPHAGOGASTRODUODENOSCOPY (EGD) WITH PROPOFOL N/A 10/21/2020   Procedure: ESOPHAGOGASTRODUODENOSCOPY (EGD) WITH PROPOFOL;  Surgeon: Sharyn Creamer, MD;  Location: [place];  Service: Gastroenterology;  Laterality: N/A;   INTRAOPERATIVE TRANSTHORACIC ECHOCARDIOGRAM N/A 10/26/2020   Procedure: INTRAOPERATIVE TRANSTHORACIC ECHOCARDIOGRAM;  Surgeon: Angelena Form; Location: [facility] OR; Pre-TAVR well-visualized calcified AoV mean Grad 37 mmHg, AVA 0.72 cm => Post TAVR well-positioned supra-annular 26 mm Medtronic Evolut Pro valve placed with no PVL.  Mean gradient 14 mmHg.  AVR 1.58 cm.  Normal flow to RCA and LM-LAD.  ; EF 60 to 65%.  Degenerative severe MAC w/ mild MR.   RIGHT/LEFT HEART CATH AND CORONARY ANGIOGRAPHY N/A 09/29/2020   Procedure: RIGHT/LEFT HEART CATH AND CORONARY ANGIOGRAPHY;  Surgeon: Burnell Blanks, MD;  Location: [place] [facility];  Service: Cardiovascular; pre-TAVR:  pRCA 20%, m-dRCA 30%. D LM-pLAD 20%. RI 30%, mLAD 20%.  Severe AS with mean gradient measured at 52.8 mmHg.   TONSILLECTOMY     TRANSCATHETER AORTIC VALVE  REPLACEMENT, TRANSFEMORAL N/A 10/26/2020   Procedure: TRANSCATHETER AORTIC VALVE REPLACEMENT, TRANSFEMORAL;  Surgeon: Burnell Blanks, MD;  Location: [facility] OR;  Service: Open Heart Surgery;; Medtronic Evolut-Pro + (size 26 mm, model # EVPROPLUS -26US, serial # [id number]); transient high-grade AV block noted so PPM left in place.   TRANSTHORACIC ECHOCARDIOGRAM  03/17/2019   a) 03/2019: EF 60 to 65%.  Moderate LVH.  GRII DD.  Mod-Severe AS (m grad 36 mmHg, peak 59 mmHg); b) 09/2019: EF 65-70%, No RWMA. Gr1 DD/hi LAP, Mild hi PAP. Mod LA Dil. MOD AS (mean Grad 34.5 mmHg).  STABLE   TRANSTHORACIC ECHOCARDIOGRAM  08/27/2020   Admitted for CVA/Afib RVR & CHF (Progression to SEVERE SYMPTOMATIC AS):  Severe Calcific Aortic Stenosis (VTI AVA estimated 0.86 cm, mean gradient 42 mmHg, V-max 4.36 m/s).  EF 55 to 60%.  Severe concentric LVH.  Unable to determine diastolic parameters.  Moderately elevated PAP.  Mild LA dilation.  Mild circumferential pericardial effusion.  Trivial MR.  Severe MAC.   TRANSTHORACIC ECHOCARDIOGRAM  11/25/2020   1 Month s/p TAVR: EF 55 to 60%.  No  R WMA.  GR 1 DD.  Elevated LVEDP.  Normal RV size with mildly elevated RVP estimated 44 mmHg.  Oscillating density in the RV suspect calcified chordal apparatus versus calcified thrombus.  Moderate LA dilation.  Normal IVC/RA P => Well-positioned 26 mm Medtronic Evolut Pro THVwith mean AOV gradient 11 mmHg.  Trivial PVL   TRANSTHORACIC ECHOCARDIOGRAM  11/02/2020   a) Day 1 Post-op TAVR 10/27/20:  Well-Positioned Supra Annular Medtronic Evolut Pro THP.  Mean gradient 12 mmHg, peak 24 mmHg.  AVA 1.8 cm.  Trivial PVL.  EF 60 to 65%.  Normal LV function.  Mild LVH.  Severe LA dilation.  Mild RA dilation.  Severe MAC;; b) 11/02/20: Normal structure and function of the aortic valve prosthesis.  Mean gradient 9 mmHg (otherwise stable)   TUBAL LIGATION      reports that she has never smoked. She has never used smokeless tobacco. She reports that she  does not drink alcohol and does not use drugs. family history includes Alcohol abuse in an other family member; Arrhythmia in her sister; Arthritis in an other family member; CAD in her sister; Cancer in her mother; Depression in an other family member; Diabetes in an other family member; Heart attack (age of onset: 61) in her sister; Heart disease in an other family member; Hyperlipidemia in an other family member; Hypertension in an other family member; Stroke in an other family member. Allergies  Allergen Reactions   Ibuprofen Other (See Comments)    Bleeding events   Current Outpatient Medications on File Prior to Visit  Medication Sig Dispense Refill   acetaminophen (TYLENOL) 500 MG tablet Take 500 mg by mouth every 6 (six) hours as needed for moderate pain.     ALPRAZolam (XANAX) 0.25 MG tablet TAKE 1 TABLET BY MOUTH THREE TIMES A DAY AS NEEDED FOR ANXIETY 60 tablet 1   amiodarone (PACERONE) 200 MG tablet Take 1 tablet (200 mg total) by mouth daily. 90 tablet 6   amLODipine (NORVASC) 10 MG tablet Take 1 tablet (10 mg total) by mouth daily. 90 tablet 3   apixaban (ELIQUIS) 5 MG TABS tablet Take 1 tablet (5 mg total) by mouth 2 (two) times daily. 60 tablet 5   atorvastatin (LIPITOR) 40 MG tablet TAKE 1 TABLET BY MOUTH EVERY DAY 90 tablet 3   blood glucose meter kit and supplies Dispense based on patient and insurance preference. Use up to four times daily as directed. E11.9 1 each 0   Blood Glucose Monitoring Suppl (ONE TOUCH ULTRA 2) w/Device KIT Use as directed daily 1 each 0   cyanocobalamin ([employer] VITAMIN B12) 1000 MCG tablet TAKE 1 TABLET BY MOUTH EVERY DAY 90 tablet 3   furosemide (LASIX) 40 MG tablet Take 1 tablet (40 mg total) by mouth daily. 90 tablet 1   Lancets (ONETOUCH DELICA PLUS [id number]) MISC USE AS DIRECTED TEST 1-2 TIMES A DAY 100 each 23   metoprolol succinate (TOPROL-XL) 25 MG 24 hr tablet Take 0.5 tablets (12.5 mg total) by mouth daily. 30 tablet 3   mirtazapine (REMERON)  15 MG tablet Take 15 mg by mouth at bedtime.     ONETOUCH ULTRA test strip USE AS INSTRUCTED TEST 1-2 TIMES A DAY 100 strip 23   pantoprazole (PROTONIX) 40 MG tablet TAKE 1 TABLET BY MOUTH TWICE A DAY 120 tablet 0   potassium chloride SA (KLOR-CON) 20 MEQ tablet Take 1 tablet (20 mEq total) by mouth daily. 90 tablet 1   No current  facility-administered medications on file prior to visit.        ROS:  All others reviewed and negative.  Objective        PE:  BP 122/60    Pulse (!) 54    Temp 98 F (36.7 C) (Oral)    Ht _0  (1.549 m)    Wt 188 lb (85.3 kg)    SpO2 97%    BMI 35.52 kg/m                 Constitutional: Pt appears in NAD               HENT: Head: NCAT.                Right Ear: External ear normal.                 Left Ear: External ear normal.                Eyes: . Pupils are equal, round, and reactive to light. Conjunctivae and EOM are normal               Nose: without d/c or deformity               Neck: Neck supple. Gross normal ROM               Cardiovascular: Normal rate and regular rhythm.                 Pulmonary/Chest: Effort normal and breath sounds without rales or wheezing.                Abd:  Soft, NT, ND, + BS, no organomegaly               Neurological: Pt is alert. At baseline orientation, motor grossly intact               Skin: Skin is warm. No rashes, no other new lesions, LE edema - trace bilateral               Psychiatric: Pt behavior is normal without agitation   Micro: none  Cardiac tracings I have personally interpreted today:  none  Pertinent Radiological findings (summarize): none   Lab Results  Component Value Date   WBC 5.6 02/16/2021   HGB 7.9 Repeated and verified X2. (LL) 02/16/2021   HCT 25.5 Repeated and verified X2. (L) 02/16/2021   PLT 245.0 02/16/2021   GLUCOSE 120 (H) 02/16/2021   CHOL 92 02/16/2021   TRIG 88.0 02/16/2021   HDL 34.60 (L) 02/16/2021   LDLCALC 40 02/16/2021   ALT 12 02/16/2021   AST 18 02/16/2021    NA 138 02/16/2021   K 4.1 02/16/2021   CL 102 02/16/2021   CREATININE 1.23 (H) 02/16/2021   BUN 24 (H) 02/16/2021   CO2 30 02/16/2021   TSH 7.54 (H) 02/16/2021   INR 1.7 (H) 11/01/2020   HGBA1C 7.5 (H) 02/16/2021   MICROALBUR <0.7 02/16/2021   Assessment/Plan:  Mary Mccarthy is a 81 y.o. White or Caucasian [1] female with  has a past medical history of ALLERGIC RHINITIS (06/24/2007), Anemia, AVM (arteriovenous malformation) of colon, Blood transfusion without reported diagnosis (05/2015), COLONIC POLYPS, HX OF (06/24/2007), Coronary artery disease, non-occlusive (05/2015), DEPRESSION (10/08/2006), DIABETES MELLITUS, TYPE II (10/05/2006), GERD (06/24/2007), History of CVA (cerebrovascular accident) (08/10/2020), HYPERLIPIDEMIA (10/08/2006), HYPERTENSION (10/08/2006), INSOMNIA (09/24/2008), Left knee DJD, Nonsustained paroxysmal ventricular tachycardia (10/2018), Osteoporosis (03/10/2016), PAF (paroxysmal  atrial fibrillation) ([place]), PAF with RVR: CHA2DS2-VASc 9.  On Eliquis and amiodarone. (08/11/2020), PEPTIC ULCER DISEASE (10/08/2006), S/P TAVR (transcatheter aortic valve replacement) (10/26/2020), and Severe aortic stenosis (08/2020).  Encounter for well adult exam with abnormal findings Age and sex appropriate education and counseling updated with regular exercise and diet Referrals for preventative services - declines optho referral Immunizations addressed - declines shignrix, coviid booster Smoking counseling  - none needed Evidence for depression or other mood disorder - chronic anxiety increased recently Most recent labs reviewed. I have personally reviewed and have noted: 1) the patient's medical and social history 2) The patient's current medications and supplements 3) The patient's height, weight, and BMI have been recorded in the chart   Hyperlipidemia with target LDL less than 70 Lab Results  Component Value Date   LDLCALC 40 02/16/2021   Stable, pt to continue current  statin lipitor 40   B12 deficiency Lab Results  Component Value Date   VITAMINB12 1,512 (H) 02/11/2021   Stable, cont oral replacement - b12 1000 mcg qd   Aortic atherosclerosis (HCC) Pt to continue lipitor, low chol diet, excercise  Anxiety with depression With recent mild worsening, for add celexa 10 qd, cont klonopin prn  Current use of long term anticoagulation - for Afib; CHA2DS2-VASc 9, on Eliquis  currenlty holding eliquis  Anemia, iron deficiency Also for f/u lab today, consider transfusion < 7  Followup: Return in about 2 months (around 04/16/2021).  Cathlean Cower, MD 02/16/2021 9:43 PM [facility]

## 2021-02-16 NOTE — Assessment & Plan Note (Signed)
Pt to continue lipitor, low chol diet, excercise 

## 2021-02-16 NOTE — Assessment & Plan Note (Signed)
Age and sex appropriate education and counseling updated with regular exercise and diet Referrals for preventative services - declines optho referral Immunizations addressed - declines shignrix, coviid booster Smoking counseling  - none needed Evidence for depression or other mood disorder - chronic anxiety increased recently Most recent labs reviewed. I have personally reviewed and have noted: 1) the patient's medical and social history 2) The patient's current medications and supplements 3) The patient's height, weight, and BMI have been recorded in the chart

## 2021-02-16 NOTE — Patient Instructions (Signed)
Please take all new medication as prescribed - the celexa 10 mg per day for nerves  Please continue all other medications as before, and refills have been done if requested.  Please have the pharmacy call with any other refills you may need.  Please continue your efforts at being more active, low cholesterol diet, and weight control.  You are otherwise up to date with prevention measures today.  Please keep your appointments with your specialists as you may have planned  Please go to the LAB at the blood drawing area for the tests to be done  You will be contacted by phone if any changes need to be made immediately.  Otherwise, you will receive a letter about your results with an explanation, but please check with MyChart first.  Please remember to sign up for MyChart if you have not done so, as this will be important to you in the future with finding out test results, communicating by private email, and scheduling acute appointments online when needed.  Please make an Appointment to return in 2 months, or sooner if needed

## 2021-02-17 ENCOUNTER — Encounter: Payer: Self-pay | Admitting: Cardiovascular Disease

## 2021-02-17 ENCOUNTER — Other Ambulatory Visit: Payer: Self-pay | Admitting: Physician Assistant

## 2021-02-17 ENCOUNTER — Ambulatory Visit: Payer: Commercial Managed Care - HMO | Admitting: Cardiovascular Disease

## 2021-02-17 ENCOUNTER — Encounter: Payer: Self-pay | Admitting: Internal Medicine

## 2021-02-17 VITALS — BP 126/70 | HR 54 | Ht 61.0 in | Wt 189.4 lb

## 2021-02-17 DIAGNOSIS — I480 Paroxysmal atrial fibrillation: Secondary | ICD-10-CM

## 2021-02-17 MED ORDER — APIXABAN 2.5 MG PO TABS: 2.5000 mg | ORAL_TABLET | Freq: Two times a day (BID) | ORAL | 1 refills | Status: DC

## 2021-02-17 NOTE — Progress Notes (Signed)
Watchman Consult Note Date:  02/17/2021   ID:  [name], DOB 1941/01/01, MRN [id number]  PCP:  Biagio Borg, MD  Cardiologist:  Dr Ellyn Hack Primary Electrophysiologist: none Referring Physician: Dr Ellyn Hack   CC: to discuss Watchman implant    History of Present Illness: Mary Mccarthy is a 81 y.o. female referred by Dr Ellyn Hack for evaluation of atrial fibrillation and stroke prevention. She has paroxysmal atrial fibrillation.  The patient has been evaluated by their referring physician and is felt to be a poor candidate for long term [place] due to chronic anemia.  She therefore presents today for Watchman evaluation.   The patient is here with her nephew today.  She was hospitalized in 2022 with acute on chronic diastolic heart failure.  She has a history of stroke in 2022 felt to be cardioembolic in nature.  The patient has been on apixaban since that time.  While she was undergoing evaluation for TAVR for treatment of severe symptomatic aortic stenosis, she developed gastrointestinal bleeding on apixaban.  She underwent upper endoscopy demonstrating gastric erosions, felt to be the source of her bleeding.  Apixaban was resumed and she underwent TAVR during that same admission.  Her rhythm has been controlled on amiodarone.  She has again developed progressive anemia with hemoglobins of 7.6 and most recently at 7.9 mg/dL.  Her apixaban has been temporarily held and she presents today for watchman evaluation.  From a symptomatic perspective, she reports no new symptoms.  She does have chronic exertional dyspnea which is stable and unchanged over time.  She denies orthopnea, PND, or syncope.  She has had no chest pain or pressure.  She denies any changes in her bowel habits and specifically denies recent melena or hematochezia.   Past Medical History:  Diagnosis Date   ALLERGIC RHINITIS 06/24/2007   Anemia    AVM (arteriovenous malformation) of colon    Blood transfusion without reported  diagnosis 05/2015   COLONIC POLYPS, HX OF 06/24/2007   Coronary artery disease, non-occlusive 05/2015   Trop + w/ Acute Anemia =>CATH: small RI - Ostial 60%, ostial RCA 30% and dLAD 40-50%;; 9/'21: Cor Ca Score 624. Mild (25-49%) prox RCA & LAD,; Moderate (50-69%) Ostial Small RI & prox LCx.     DEPRESSION 10/08/2006   DIABETES MELLITUS, TYPE II 10/05/2006   GERD 06/24/2007   History of CVA (cerebrovascular accident) 08/10/2020   08/2020- found to be in afib with RVR, started on Eliquis (shower of emboli from A. fib as complication of severe AS)   HYPERLIPIDEMIA 10/08/2006   HYPERTENSION 10/08/2006   INSOMNIA 09/24/2008   Left knee DJD    Nonsustained paroxysmal ventricular tachycardia 10/2018   Post-TAVR Zio Patch: Frequent (206) runs of NSVT - Fastest 5 beats 239 bpm. Longest 9.3 Sec avg 135 bpm. (? possibly Afib w/ aberrancy)   Osteoporosis 03/10/2016   PAF (paroxysmal atrial fibrillation) (HCC)    PAF with RVR: CHA2DS2-VASc 9.  On Eliquis and amiodarone. 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 Angelena Form & Dr. Cyndia Bent   Severe aortic stenosis 08/2020   Progression from Mod-Severe AS to Severe AS - noted on Echo 08/2020 -> (mean gradient progressed from 34 to 42 mmHg) this was in setting of new onset A. fib RVR, acute diastolic HF and shower of emboli CVA. ->  Referred for TAVR,  completed 10/26/2020   Past Surgical History:  Procedure Laterality Date   APPENDECTOMY     BIOPSY  10/21/2020   Procedure: BIOPSY;  Surgeon: Sharyn Creamer, MD;  Location: [facility] ENDOSCOPY;  Service: Gastroenterology;;   BREAST BIOPSY     CARDIAC CATHETERIZATION N/A 05/21/2015   Procedure: Left Heart Cath and Coronary Angiography;  Surgeon: Leonie Man, MD;  Location: [place] CV LAB;  Service: Cardiovascular;: Ost RI 60%, Ost RCA 30%, dLAD tapers to small vessel w/  40-50%. Mildly elevated LVEDP. Normal LV Fxn.   COLONOSCOPY N/A 05/20/2015   Procedure: COLONOSCOPY;  Surgeon: Manus Gunning, MD;  Location: [facility];  Service: Gastroenterology;  Laterality: N/A;   CORONARY CA2+ SCORE / CARDIAC CT ANGIOGRAM  10/09/2019   Calcium score 624.  82nd percentile. Dominant RCA: Mild (25-49%) proximal stenosis-distal bifurcation into PDA and PAV--< RPL branches.  LAD (1 major mid vessel diagonal) diffuse calcified plaque, mild proximal stenosis with minimal distal stenosis.  Small RI moderate ostial disease.  LCx-moderate mixed (50-69%) proximal stenosis.  Small dOM1 disease.  Trileaflet AoV, annular Ca2+ - probable AS   ESOPHAGOGASTRODUODENOSCOPY N/A 05/20/2015   Procedure: ESOPHAGOGASTRODUODENOSCOPY (EGD);  Surgeon: Manus Gunning, MD;  Location: [facility];  Service: Gastroenterology;  Laterality: N/A;   ESOPHAGOGASTRODUODENOSCOPY (EGD) WITH PROPOFOL N/A 10/21/2020   Procedure: ESOPHAGOGASTRODUODENOSCOPY (EGD) WITH PROPOFOL;  Surgeon: Sharyn Creamer, MD;  Location: [facility];  Service: Gastroenterology;  Laterality: N/A;   INTRAOPERATIVE TRANSTHORACIC ECHOCARDIOGRAM N/A 10/26/2020   Procedure: INTRAOPERATIVE TRANSTHORACIC ECHOCARDIOGRAM;  Surgeon: Angelena Form; Location: [facility] OR; Pre-TAVR well-visualized calcified AoV mean Grad 37 mmHg, AVA 0.72 cm => Post TAVR well-positioned supra-annular 26 mm Medtronic Evolut Pro valve placed with no PVL.  Mean gradient 14 mmHg.  AVR 1.58 cm.  Normal flow to RCA and LM-LAD.  ; EF 60 to 65%.  Degenerative severe MAC w/ mild MR.   RIGHT/LEFT HEART CATH AND CORONARY ANGIOGRAPHY N/A 09/29/2020   Procedure: RIGHT/LEFT HEART CATH AND CORONARY ANGIOGRAPHY;  Surgeon: Burnell Blanks, MD;  Location: [place] CV LAB;  Service: Cardiovascular; pre-TAVR:  pRCA 20%, m-dRCA 30%. D LM-pLAD 20%. RI 30%, mLAD 20%.  Severe AS with mean gradient measured at 52.8 mmHg.   TONSILLECTOMY     TRANSCATHETER AORTIC VALVE  REPLACEMENT, TRANSFEMORAL N/A 10/26/2020   Procedure: TRANSCATHETER AORTIC VALVE REPLACEMENT, TRANSFEMORAL;  Surgeon: Burnell Blanks, MD;  Location: [facility] OR;  Service: Open Heart Surgery;; Medtronic Evolut-Pro + (size 26 mm, model # EVPROPLUS -26US, serial # [id number]); transient high-grade AV block noted so PPM left in place.   TRANSTHORACIC ECHOCARDIOGRAM  03/17/2019   a) 03/2019: EF 60 to 65%.  Moderate LVH.  GRII DD.  Mod-Severe AS (m grad 36 mmHg, peak 59 mmHg); b) 09/2019: EF 65-70%, No RWMA. Gr1 DD/hi LAP, Mild hi PAP. Mod LA Dil. MOD AS (mean Grad 34.5 mmHg).  STABLE   TRANSTHORACIC ECHOCARDIOGRAM  08/27/2020   Admitted for CVA/Afib RVR & CHF (Progression to SEVERE SYMPTOMATIC AS):  Severe Calcific Aortic Stenosis (VTI AVA estimated 0.86 cm, mean gradient 42 mmHg, V-max 4.36 m/s).  EF 55 to 60%.  Severe concentric LVH.  Unable to determine diastolic parameters.  Moderately elevated PAP.  Mild LA dilation.  Mild circumferential pericardial effusion.  Trivial MR.  Severe MAC.   TRANSTHORACIC ECHOCARDIOGRAM  11/25/2020   1 Month s/p TAVR: EF 55 to 60%.  No R WMA.  GR 1 DD.  Elevated LVEDP.  Normal RV size with mildly elevated RVP estimated  44 mmHg.  Oscillating density in the RV suspect calcified chordal apparatus versus calcified thrombus.  Moderate LA dilation.  Normal IVC/RA P => Well-positioned 26 mm Medtronic Evolut Pro THVwith mean AOV gradient 11 mmHg.  Trivial PVL   TRANSTHORACIC ECHOCARDIOGRAM  11/02/2020   a) Day 1 Post-op TAVR 10/27/20:  Well-Positioned Supra Annular Medtronic Evolut Pro THP.  Mean gradient 12 mmHg, peak 24 mmHg.  AVA 1.8 cm.  Trivial PVL.  EF 60 to 65%.  Normal LV function.  Mild LVH.  Severe LA dilation.  Mild RA dilation.  Severe MAC;; b) 11/02/20: Normal structure and function of the aortic valve prosthesis.  Mean gradient 9 mmHg (otherwise stable)   TUBAL LIGATION       Current Outpatient Medications  Medication Sig Dispense Refill   acetaminophen (TYLENOL)  500 MG tablet Take 500 mg by mouth every 6 (six) hours as needed for moderate pain.     ALPRAZolam (XANAX) 0.25 MG tablet TAKE 1 TABLET BY MOUTH THREE TIMES A DAY AS NEEDED FOR ANXIETY 60 tablet 1   amiodarone (PACERONE) 200 MG tablet Take 1 tablet (200 mg total) by mouth daily. 90 tablet 6   amLODipine (NORVASC) 10 MG tablet Take 1 tablet (10 mg total) by mouth daily. 90 tablet 3   atorvastatin (LIPITOR) 40 MG tablet TAKE 1 TABLET BY MOUTH EVERY DAY 90 tablet 3   blood glucose meter kit and supplies Dispense based on patient and insurance preference. Use up to four times daily as directed. E11.9 1 each 0   Blood Glucose Monitoring Suppl (ONE TOUCH ULTRA 2) w/Device KIT Use as directed daily 1 each 0   citalopram (CELEXA) 10 MG tablet Take 1 tablet (10 mg total) by mouth daily. 90 tablet 3   cyanocobalamin ([employer] VITAMIN B12) 1000 MCG tablet TAKE 1 TABLET BY MOUTH EVERY DAY 90 tablet 3   furosemide (LASIX) 40 MG tablet Take 1 tablet (40 mg total) by mouth daily. 90 tablet 1   Lancets (ONETOUCH DELICA PLUS QRFXJO83G) MISC USE AS DIRECTED TEST 1-2 TIMES A DAY 100 each 23   losartan (COZAAR) 50 MG tablet Take 0.5 tablets (25 mg total) by mouth daily. 45 tablet 3   metFORMIN (GLUCOPHAGE) 1000 MG tablet 1/2 tab by mouth twice per day 90 tablet 3   metoprolol succinate (TOPROL-XL) 25 MG 24 hr tablet Take 0.5 tablets (12.5 mg total) by mouth daily. 30 tablet 3   mirtazapine (REMERON) 15 MG tablet Take 15 mg by mouth at bedtime.     ONETOUCH ULTRA test strip USE AS INSTRUCTED TEST 1-2 TIMES A DAY 100 strip 23   pantoprazole (PROTONIX) 40 MG tablet TAKE 1 TABLET BY MOUTH TWICE A DAY 120 tablet 0   apixaban (ELIQUIS) 5 MG TABS tablet Take 1 tablet (5 mg total) by mouth 2 (two) times daily. (Patient not taking: Reported on 02/17/2021) 60 tablet 5   potassium chloride SA (KLOR-CON) 20 MEQ tablet Take 1 tablet (20 mEq total) by mouth daily. (Patient not taking: Reported on 02/17/2021) 90 tablet 1   No current  facility-administered medications for this visit.    Allergies:   Ibuprofen   Social History:  The patient  reports that she has never smoked. She has never used smokeless tobacco. She reports that she does not drink alcohol and does not use drugs.   Family History:  The patient's  family history includes Alcohol abuse in an other family member; Arrhythmia in her sister; Arthritis in an other  family member; CAD in her sister; Cancer in her mother; Depression in an other family member; Diabetes in an other family member; Heart attack (age of onset: 40) in her sister; Heart disease in an other family member; Hyperlipidemia in an other family member; Hypertension in an other family member; Stroke in an other family member.    ROS:  Please see the history of present illness.   All other systems are reviewed and negative.    PHYSICAL EXAM: VS:  BP 126/70    Pulse (!) 54    Ht _0  (1.549 m)    Wt 189 lb 6.4 oz (85.9 kg)    SpO2 96%    BMI 35.79 kg/m  , BMI Body mass index is 35.79 kg/m. GEN: Well nourished, well developed, elderly woman in no acute distress  HEENT: normal  Neck: no JVD, carotid bruits, or masses Cardiac: RRR; 2/6 systolic murmur at the right upper sternal border Respiratory:  clear to auscultation bilaterally, normal work of breathing GI: soft, nontender, nondistended, + BS MS: no deformity or atrophy  Skin: warm and dry  Neuro:  Strength and sensation are intact Psych: euthymic mood, full affect  EKG:  EKG is not ordered today.   Recent Labs: 11/01/2020: B Natriuretic Peptide 124.6 02/02/2021: Magnesium 1.5; NT-Pro BNP 623 02/16/2021: ALT 12; BUN 24; Creatinine, Ser 1.23; Hemoglobin 7.9 Repeated and verified X2.; Platelets 245.0; Potassium 4.1; Sodium 138; TSH 7.54    Lipid Panel     Component Value Date/Time   CHOL 92 02/16/2021 1455   TRIG 88.0 02/16/2021 1455   TRIG 90 12/01/2005 1330   HDL 34.60 (L) 02/16/2021 1455   CHOLHDL 3 02/16/2021 1455   VLDL 17.6  02/16/2021 1455   LDLCALC 40 02/16/2021 1455     Wt Readings from Last 3 Encounters:  02/17/21 189 lb 6.4 oz (85.9 kg)  02/16/21 188 lb (85.3 kg)  11/29/20 195 lb (88.5 kg)      Other studies Reviewed: Additional studies/ records that were reviewed today include:   2D echocardiogram 11/25/2020:  1. Left ventricular ejection fraction, by estimation, is 55 to 60%. The  left ventricle has normal function. The left ventricle has no regional  wall motion abnormalities. There is mild left ventricular hypertrophy.  Left ventricular diastolic parameters  are consistent with Grade I diastolic dysfunction (impaired relaxation).  Elevated left ventricular end-diastolic pressure. The E/e' is 20.   2. Oscillating density seen in the RV, inferior to the TV (images 45-46),  suspect calcified chordal apparatus or less likely calcified  thrombus/vegetaton -clinical correlation advised. Right ventricular  systolic function is normal. The right ventricular   size is normal. There is mildly elevated pulmonary artery systolic  pressure. The estimated right ventricular systolic pressure is 47.6 mmHg.   3. Left atrial size was moderately dilated.   4. The mitral valve is abnormal. Trivial mitral valve regurgitation. No  evidence of mitral stenosis. Moderate mitral annular calcification.   5. The aortic valve has been repaired/replaced. Aortic valve  regurgitation is trivial. There is a 26 mm Medtronic Evolut valve present  in the aortic position. Procedure Date: 10/26/2020. Echo findings are  consistent with perivalvular leak of the aortic  prosthesis. Aortic valve area, by VTI measures 2.05 cm. Aortic valve mean  gradient measures 11.0 mmHg. Aortic valve Vmax measures 2.44 m/s.   6. The inferior vena cava is normal in size with greater than 50%  respiratory variability, suggesting right atrial pressure of 3 mmHg.  Cardiac CTA images are personally reviewed  ASSESSMENT AND PLAN:  1.  Paroxysmal  atrial fibrillation I have seen Mary Mccarthy is a 81 y.o. female in the office today who has been referred for a Watchman left atrial appendage closure device.  She has a history of paroxysmal atrial fibrillation.  This patients CHA2DS2-VASc Score and unadjusted Ischemic Stroke Rate (% per year) is equal to 10.8 % stroke rate/year from a score of 8 which necessitates long term oral anticoagulation to prevent stroke. HasBled score is:  HAS-BLED score  Hypertension No  Abnormal renal and liver function (Dialysis, transplant, Cr >2.26 mg/dL /Cirrhosis or Bilirubin >2x Normal or AST/ALT/AP >3x Normal) No  Stroke Yes  Bleeding Yes  Labile INR (Unstable/high INR) No  Elderly (>65) Yes  Drugs or alcohol (? 8 drinks/week, anti-plt or NSAID) No   Unfortunately, She is not felt to be a long term anticoagulation candidate secondary to GI bleeding, iron deficiency anemia.  The patients chart has been reviewed and I along with their referring cardiologist feel that they would be a candidate for short term oral anticoagulation.  Procedural risks for the Watchman implant have been reviewed with the patient including a 1% risk of stroke, 1% risk of perforation or pericardial effusion, 0.1% risk of device embolization.  Given the patient's poor candidacy for long-term oral anticoagulation, ability to tolerate short term oral anticoagulation, I have recommended the Watchman FLX left atrial appendage closure device through a shared decision making conversation with the patient.   I reviewed the watchman procedure in detail with the patient.  I demonstrated a procedural animation to the patient and her nephew this morning, reviewing each step and potential risks.  We discussed expected outcomes, postprocedural anticoagulation strategies, and effectiveness of watchman implantation compared with chronic oral anticoagulation.  The patient has a strong desire to discontinue anticoagulation over the long-term.  She  understands the need for short-term anticoagulation with watchman implantation.  It is a difficult balance in her because of her high CHA2DS2-VASc score and also her continued anemia and probable slow GI blood loss.  After discussion with her primary cardiologist, Dr. Ellyn Hack, we agreed that a good compromise might be apixaban 2.5 mg twice daily over the intermediate term while she undergoes further watchman evaluation and treatment.  The patient had a gated cardiac CTA study performed last year for her TAVR procedure.  I will ask for our structural imagers to review her images.  I have personally reviewed the CTA and I suspect she has favorable anatomy for watchman implantation, but I will confirm this with our imaging specialist.  Once appropriate anatomy is confirmed, the patient would like to proceed with Watchman FLX implantation.  She will be contacted by her nurse navigator and the procedure will be arranged at the next available date.  Current medicines are reviewed at length with the patient today.   The patient does not have concerns regarding her medicines.  The following changes were made today:  Apixaban 2.5 mg BID started today  Labs/ tests ordered today include:  No orders of the defined types were placed in this encounter.    SignedSherren Mocha, MD 02/17/2021  10:29 AM     [address] [place] [address] [phone] (office) [phone] (fax)

## 2021-02-17 NOTE — Patient Instructions (Addendum)
Medication Instructions:  Eliquis 2.5mg  twice daily *If you need a refill on your cardiac medications before your next appointment, please call your pharmacy*   Lab Work: NONE If you have labs (blood work) drawn today and your tests are completely normal, you will receive your results only by: [place] (if you have MyChart) OR A paper copy in the mail If you have any lab test that is abnormal or we need to change your treatment, we will call you to review the results.   Testing/Procedures: NONE   Follow-Up: Please contact the office when you decide if you are ready to proceed.

## 2021-02-20 ENCOUNTER — Other Ambulatory Visit: Payer: Self-pay | Admitting: Internal Medicine

## 2021-02-21 ENCOUNTER — Ambulatory Visit: Payer: Medicare Other | Admitting: *Deleted

## 2021-02-21 DIAGNOSIS — I5032 Chronic diastolic (congestive) heart failure: Secondary | ICD-10-CM

## 2021-02-21 DIAGNOSIS — I480 Paroxysmal atrial fibrillation: Secondary | ICD-10-CM

## 2021-02-21 DIAGNOSIS — E1165 Type 2 diabetes mellitus with hyperglycemia: Secondary | ICD-10-CM

## 2021-02-21 NOTE — Chronic Care Management (AMB) (Signed)
Chronic Care Management   CCM RN Visit Note  02/21/2021 Name: Mary Mccarthy MRN: [id number] DOB: 01-05-1941  Subjective: Mary Mccarthy is a 81 y.o. year old female who is a primary care patient of Corwin Levins, MD. The care management team was consulted for assistance with disease management and care coordination needs.    Engaged with patient by telephone for follow up visit in response to provider referral for case management and/or care coordination services.   Consent to Services:  The patient was given information about Chronic Care Management services, agreed to services, and gave verbal consent prior to initiation of services.  Please see initial visit note for detailed documentation.  Patient agreed to services and verbal consent obtained.   Assessment: Review of patient past medical history, allergies, medications, health status, including review of consultants reports, laboratory and other test data, was performed as part of comprehensive evaluation and provision of chronic care management services.   SDOH (Social Determinants of Health) assessments and interventions performed:  SDOH Interventions    Flowsheet Row Most Recent Value  SDOH Interventions   Food Insecurity Interventions Intervention Not Indicated  [denies food insecurity, confirms has food stamps]  Transportation Interventions Intervention Not Indicated  [continues driving self short distances,  family also assists as/ if indicated]        CCM Care Plan  Allergies  Allergen Reactions   Ibuprofen Other (See Comments)    Bleeding events    Outpatient Encounter Medications as of 02/21/2021  Medication Sig   acetaminophen (TYLENOL) 500 MG tablet Take 500 mg by mouth every 6 (six) hours as needed for moderate pain.   ALPRAZolam (XANAX) 0.25 MG tablet TAKE 1 TABLET BY MOUTH THREE TIMES A DAY AS NEEDED FOR ANXIETY   amiodarone (PACERONE) 200 MG tablet Take 1 tablet (200 mg total) by mouth daily.   amLODipine  (NORVASC) 10 MG tablet Take 1 tablet (10 mg total) by mouth daily.   apixaban (ELIQUIS) 2.5 MG TABS tablet Take 1 tablet (2.5 mg total) by mouth 2 (two) times daily.   atorvastatin (LIPITOR) 40 MG tablet TAKE 1 TABLET BY MOUTH EVERY DAY   blood glucose meter kit and supplies Dispense based on patient and insurance preference. Use up to four times daily as directed. E11.9   Blood Glucose Monitoring Suppl (ONE TOUCH ULTRA 2) w/Device KIT Use as directed daily   citalopram (CELEXA) 10 MG tablet Take 1 tablet (10 mg total) by mouth daily.   cyanocobalamin ([employer] VITAMIN B12) 1000 MCG tablet TAKE 1 TABLET BY MOUTH EVERY DAY   furosemide (LASIX) 40 MG tablet Take 1 tablet (40 mg total) by mouth daily.   Lancets (ONETOUCH DELICA PLUS LANCET33G) MISC USE AS DIRECTED TEST 1-2 TIMES A DAY   losartan (COZAAR) 50 MG tablet Take 0.5 tablets (25 mg total) by mouth daily.   metFORMIN (GLUCOPHAGE) 1000 MG tablet 1/2 tab by mouth twice per day   metoprolol succinate (TOPROL-XL) 25 MG 24 hr tablet Take 0.5 tablets (12.5 mg total) by mouth daily.   mirtazapine (REMERON) 15 MG tablet Take 15 mg by mouth at bedtime.   ONETOUCH ULTRA test strip USE AS INSTRUCTED TEST 1-2 TIMES A DAY   pantoprazole (PROTONIX) 40 MG tablet TAKE 1 TABLET BY MOUTH TWICE A DAY   potassium chloride SA (KLOR-CON) 20 MEQ tablet Take 1 tablet (20 mEq total) by mouth daily. (Patient not taking: Reported on 02/17/2021)   No facility-administered encounter medications on file as of 02/21/2021.  Patient Active Problem List   Diagnosis Date Noted   Encounter for well adult exam with abnormal findings 02/16/2021   Long term current use of amiodarone for rate-rhythm control of A. fib 11/28/2020   Chronic respiratory failure ([place]) 11/22/2020   Dehydration 11/02/2020   Diabetes mellitus type 2 in obese ([place]) 11/02/2020   AKI (acute kidney injury) ([place]) 11/01/2020   Transient post TAVR CHB -> not seen on follow-up Zio patch 10/28/2020   S/P TAVR  (transcatheter aortic valve replacement) 10/26/2020   Melena 10/20/2020   Symptomatic anemia 10/19/2020   Acute bronchitis 09/21/2020   Current use of long term anticoagulation - for Afib; CHA2DS2-VASc 9, on Eliquis 08/23/2020   PAF with RVR: CHA2DS2-VASc 9.  On Eliquis and amiodarone. 08/11/2020   History of CVA (cerebrovascular accident) 08/10/2020   Hypomagnesemia 08/10/2020   Severe aortic stenosis 08/2020   Aortic atherosclerosis ([place]) 03/18/2020   B12 deficiency 09/21/2019   Cough 03/14/2018   Wheezing 03/14/2018   Anxiety 09/11/2017   Osteoporosis 03/10/2016   Coronary artery disease, non-occlusive 05/24/2015   GI bleed    Pulmonary hypertension (HCC)    CHF (congestive heart failure), NYHA class II, chronic, diastolic ([place]) 53/29/9242   Gastric ulceration    AVM (arteriovenous malformation) of colon    Demand ischemia of myocardium (HCC)    Right knee pain 05/21/2014   Anemia, iron deficiency 05/20/2013   MENOPAUSAL DISORDER 12/07/2009   INSOMNIA 09/24/2008   ALLERGIC RHINITIS 06/24/2007   GERD 06/24/2007   COLONIC POLYPS, HX OF 06/24/2007   Hyperlipidemia with target LDL less than 70 10/08/2006   Anxiety with depression 10/08/2006   Essential hypertension 10/08/2006   Peptic ulcer 10/08/2006   Type 2 diabetes mellitus with hyperglycemia, without long-term current use of insulin ([place]) 10/05/2006   Morbid obesity ([place]) 10/05/2006   Conditions to be addressed/monitored:  Atrial Fibrillation, CHF, and DMII  Care Plan : RN Care Manager Plan of Care  Updates made by Knox Royalty, RN since 02/21/2021 12:00 AM     Problem: Chronic Disease Management Needs   Priority: High     Long-Range Goal: Development of plan of care for long term chronic disease management   Start Date: 11/17/2020  Expected End Date: 11/17/2021  Priority: High  Note:   Current Barriers:  Chronic Disease Management support and education needs related to CHF and DMII Lives with son- who works  during daytime hours: alone during day, limited stamina to perform ADL's/ iADL's after multiple recent hospitalizations Fragile state of health, multiple progressing chronic health conditions Confusion around multiple new medications, post- recent hospitalizations- 12/06/20- confirmed patient has spoken with CCM Pharmacy team; 02/21/21- confirmed CCM pharmacy team active 12/06/20: Patient has received home oxygen, however, she reports not using: states oxygenator is "too loud" and keeps her awake: 01/19/21: Now resolved 12/20/20-- reports unable to get through to [facility]: care coordination outreach placed on patient's behalf 01/19/21: reports [employer] came out to her home after last care coordination outreach and replaced the oxygenator- reports "it is much better and much more quiet; I am using it occasionally" Multiple recent inpatient hospitalizations: 01/19/21: confirmed no new/ recent hospital or ED visits since last discharge 11/04/20 August 22-26, 2022: CHF September 12-22, 2022: AF with RVR; CHF; TAVR September 26-29, 2022: N/V, AKI  RNCM Clinical Goal(s):  Patient will verbalize basic understanding of CHF and DMII disease process and self health management plan CHF; A-Fib; DMII  through collaboration with RN Care manager, provider, and care  team.   Interventions: 1:1 collaboration with primary care provider regarding development and update of comprehensive plan of care as evidenced by provider attestation and co-signature Inter-disciplinary care team collaboration (see longitudinal plan of care) Evaluation of current treatment plan related to  self management and patient's adherence to plan as established by provider SDOH updated: no new/ unmet concerns identified Pain assessment updated: denies acute/ chronic pain Falls assessment updated: denies new/ recent falls, continues using cane as needed/ indicated  Heart Failure Interventions:  (Status: Goal on Track (progressing):  YES.) Discussed importance of daily weight and advised patient to weigh and record daily Reviewed role of diuretics in prevention of fluid overload and management of heart failure Discussed the importance of keeping all appointments with provider Reviewed recent weights at home: reports consistently between 188-189 lbs, with weight this morning of "188 lbs;" denies weight gain > 3 lbs overnight/ 5 lbs in one week Reinforced previously provided education monitoring daily weights/ breathing status at home, and to follow action plan for yellow CHF zone: patient continues to require ongoing reinforcement/ support Discussed use of home oxygen at home: again reports not having to use frequently, reports "breathing is better overall;" states she uses "maybe" once a week for short periods of time Reviewed recent cardiology provider office visit 02/17/21: confirmed patient taking Eliquis 2.5 mg po BID, as prescribed; patient reports that at the request of Dr. Ellyn Hack, Dr. Burt Knack is working her up to have a "Watchman Device" procedure; she has not definitively made up her mind yet; states she "is nervous about thought of it" due to recent numerous hospitalizations: answered patient's immediate/ general questions about this procedure and encouraged her to write down questions/ concerns on paper to take with her during time of next office visit: she will do; I will also provide printed educational material; she confirms the general plan moving forward is to await scheduling appointment with Dr. Burt Knack-- she has not yet heard back Confirms no recent signs/ symptoms A-Fib-- signs/ symptoms along with corresponding action plan reinforced-- she will require ongoing support/ reinforcement Discussed purpose/ rationale for ACT in setting of A-F; provided education around same Reviewed upcoming provider office visit with patient: 03/24/21- CCM Pharmacist; 04/20/21- PCP office visit; 05/02/21- cardiology/ primary/ Dr. Ellyn Hack:  patient verbalizes awareness of all appointments and plans to attend as scheduled  Diabetes:  (Status: Goal on Track (progressing): YES.) Lab Results  Component Value Date   HGBA1C 7.5 (H) 02/16/2021  Provided education to patient about basic DM disease process; Discussed plans with patient for ongoing care management follow up and provided patient with direct contact information for care management team;      Review of patient status, including review of consultants reports, relevant laboratory and other test results, and medications completed;       Confirmed patient continues monitoring/ recording blood sugars at home BID: fasting and post-prandial Reviewed with patient recent blood sugars at home: again verbalizes concern that her blood sugars "have still been running much higher than they normally do;" reports consistent fasting and post-prandial blood sugar values between 130-180, when they had previously been running 120-165; confirms taking metformin 500 mg BID:  Confirmed no recent low blood sugars <80, no signs/ symptoms hypoglycemia Reviewed recent PCP office visit 02/16/21- confirmed patient has started taking citalopram, states "haven't been taking long enough" to determine effectiveness Reviewed most recent A1-C value: 7.5 (02/16/21) Assessed patient's understanding of meaning/ significance of A1-C values: fair baseline understanding of same- reviewed individual historical A1-C  trends and provided education around correlation of A1-C value to blood sugar levels at home over 3 months Reinforced previously provided education on dietary strategies for low salt/ heart healthy; diabetic/ low sugar/ carb diet: reports appetite "good on some days, poor on others"  Patient Goals/Self-Care Activities: As evidenced by review of EHR, collaboration with care team, and patient reporting during CCM RN CM outreach,  Patient Mary Mccarthy will: Take medications as prescribed   Attend all scheduled  provider appointments  Call pharmacy for medication refills  Call provider office for new concerns or questions  Continue to follow heart healthy, low salt, carbohydrate modified, low sugar diet Continue to monitor and record fasting and afternoon blood sugars at home daily Continue to monitor and record daily weights at home: you reported a weight this morning of 188 lbs Use home O2 as prescribed-- as needed for episodes of shortness of breath Keep up the great work preventing falls-- continue using your cane as needed Do your best to eat a heart healthy diet that is low in salt, cholesterol, sugar and carbohydrates Make a list of your questions about the Watchman's procedure to discuss with your cardiology team    Plan: Telephone follow up appointment with care management team member scheduled for:  Wednesday, March 30, 2021 at 3:00 pm The patient has been provided with contact information for the care management team and has been advised to call with any health related questions or concerns  [name], RN, BSN, [place] [phone]: direct office

## 2021-02-21 NOTE — Patient Instructions (Signed)
Visit Information  Mary Mccarthy, Thank you for taking time to talk with me today. Please don't hesitate to contact me if I can be of assistance to you before our next scheduled telephone appointment.  Below are the goals we discussed today:  Patient Self-Care Activities: Patient Mary Mccarthy will: Take medications as prescribed   Attend all scheduled provider appointments  Call pharmacy for medication refills  Call provider office for new concerns or questions  Continue to follow heart healthy, low salt, carbohydrate modified, low sugar diet Continue to monitor and record fasting and afternoon blood sugars at home daily Continue to monitor and record daily weights at home: you reported a weight this morning of 188 lbs Use home O2 as prescribed-- as needed for episodes of shortness of breath Keep up the great work preventing falls-- continue using your cane as needed Do your best to eat a heart healthy diet that is low in salt, cholesterol, sugar and carbohydrates Make a list of your questions about the Watchman's procedure to discuss with your cardiology team  Our next scheduled telephone follow up visit/ appointment with care management team member is scheduled on:   Wednesday, March 30, 2021 at 3:00 pm-- This is a PHONE CALL appointment  If you need to cancel or re-schedule our visit, please call [phone] and our care guide team will be happy to assist you.   I look forward to hearing about your progress.   Caryl Pina, RN, BSN, CCRN Alumnus [facility] RN Care Coordination- [facility] [phone]: direct office  If you are experiencing a Mental Health or Behavioral Health Crisis or need someone to talk to, please  call the Suicide and Crisis Lifeline: 988 call the [place] National Suicide Prevention Lifeline: (209)093-3213 or TTY: (662)301-6193 TTY 3151162672) to talk to a trained counselor call 1-800-273-TALK (toll free, 24 hour hotline) go to [facility] [address] [place] [phone]) call 911   The patient verbalized understanding of instructions, educational materials, and care plan provided today and agreed to receive a mailed copy of patient instructions, educational materials, and care plan  Left Atrial Appendage Closure Device Implantation, Care After The following information offers guidance on how to care for yourself after your procedure. Your health care provider may also give you more specific instructions. If you have problems or questions, contact your health care provider. What can I expect after the procedure? After the procedure, it is common to have: Some pain, swelling, soreness, and bruising around the site where a long, thin tube (catheter) was inserted. Tiredness (fatigue). Follow these instructions at home: Medicines Take over-the-counter and prescription medicines only as told by your health care provider. If you were prescribed antibiotic medicine, take it as told by your health care provider. Do not stop taking the antibiotic even if you start to feel better. You may need to take medicines to prevent blood clots. Catheter insertion area care  Follow instructions from your health care provider about how to take care of the catheter insertion site. Make sure you: Wash your hands with soap and water for at least 20 seconds before and after you change your bandage (dressing). If soap and water are not available, use hand sanitizer. Change your dressing as told by your health care provider. Leave stitches (sutures), skin glue, or adhesive strips in place. These skin closures may need to stay in place for 2 weeks or longer. If adhesive strip edges start to loosen and curl up,  you may trim the loose edges. Do not remove adhesive strips completely unless your health care provider tells you to do that. Skin glue and adhesive strips usually fall off on their own. Check your catheter  insertion area every day for signs of infection. Check for: More redness, swelling, or pain. Fluid or blood. Warmth. Pus or a bad smell. A lump or bump that may develop. Activity  Do not lift anything that is heavier than 5 lb (2.3 kg), or the limit that you are told, until your health care provider says that it is safe. Avoid activities that take a lot of effort (are strenuous). Ask your health care provider what activities are safe for you. Avoid sexual activity until your health care provider says that it is safe. Do not drive for the time you are told if you were given a medicine to help you relax (sedative) during the procedure. Ask your health care provider when it is safe for you to drive. Lifestyle Do not use any products that contain nicotine or tobacco. These include cigarettes, chewing tobacco, and vaping devices, such as e-cigarettes. If you need help quitting, ask your health care provider. If you drink alcohol: . Limit how much you use to: 0-1 drink a day for women. 0-2 drinks a day for men. Know how much alcohol is in your drink. In the [place] one drink equals one 12 oz bottle of beer (355 mL), one 5 oz glass of wine (148 mL), or one 1 oz glass of hard liquor (44 mL). General instructions Do not take baths, swim, shower, or use a hot tub until your health care provider approves. Follow instructions from your health care provider about eating or drinking restrictions. You may need to follow a diet that is low in salt (sodium) and low in fat to prevent heart problems. Keep all follow-up visits. This is important. Contact a health care provider if: You have severe pain that does not get better with medicine. You have more redness, swelling, or pain around your catheter insertion site. You have fluid or blood coming from your catheter insertion area. Your catheter insertion site feels warm to the touch. You have pus or a bad smell coming from your catheter insertion area. You  have a fever. You have nausea and vomiting. You develop a lump or bump in your catheter insertion area. Get help right away if:  The catheter insertion area is bleeding, and the bleeding does not stop when you put steady pressure on the area. You have chest pain. You have trouble breathing. You have dizziness. You faint. You have any symptoms of a stroke. "BE FAST" is an easy way to remember the main warning signs of a stroke: B - Balance. Signs are dizziness, sudden trouble walking, or loss of balance. E - Eyes. Signs are trouble seeing or a sudden change in vision. F - Face. Signs are sudden weakness or numbness of the face, or the face or eyelid drooping on one side. A - Arms. Signs are weakness or numbness in an arm. This happens suddenly and usually on one side of the body. S - Speech. Signs are sudden trouble speaking, slurred speech, or trouble understanding what people say. T - Time. Time to call emergency services. Write down what time symptoms started. You have other signs of a stroke, such as: A sudden, severe headache with no known cause. Nausea or vomiting. Seizure. These symptoms may represent a serious problem that is an emergency. Do not  wait to see if the symptoms will go away. Get medical help right away. Call your local emergency services (911 in the [place] Do not drive yourself to the hospital. Summary It is common to have some pain and soreness after this procedure. Check your catheter insertion area every day for signs of infection, such as more redness, swelling, or pain. Do not take baths, swim, shower, or use a hot tub until your health care provider approves. This information is not intended to replace advice given to you by your health care provider. Make sure you discuss any questions you have with your health care provider. Document Revised: 10/02/2019 Document Reviewed: 10/02/2019 Elsevier Patient Education  2022 [employer].

## 2021-02-24 ENCOUNTER — Telehealth: Payer: Self-pay

## 2021-02-24 DIAGNOSIS — I509 Heart failure, unspecified: Secondary | ICD-10-CM | POA: Diagnosis not present

## 2021-02-24 NOTE — Telephone Encounter (Signed)
Discussed patient's imaging at [facility] Team meeting this morning.  Per Dr. Eden Emms, the patient's anatomy is suitable for a FLX 87mm or 65mm   Called patient to discuss date/timing of procedure.  Left message that she will be called tomorrow.

## 2021-02-25 NOTE — Telephone Encounter (Signed)
The patient would like to proceed with LAAO 03/31/2021. She is scheduled 03/28/21 for pre-procedure visit. She understands she will get instructions for Watchman that day and go for her Covid test. She was grateful for call and agrees with plan.

## 2021-03-04 ENCOUNTER — Other Ambulatory Visit: Payer: Self-pay | Admitting: Cardiovascular Disease

## 2021-03-05 LAB — SARS CORONAVIRUS 2 (TAT 6-24 HRS): SARS Coronavirus 2: NEGATIVE

## 2021-03-06 ENCOUNTER — Other Ambulatory Visit: Payer: Self-pay | Admitting: Physician Assistant

## 2021-03-07 ENCOUNTER — Other Ambulatory Visit: Payer: Self-pay

## 2021-03-07 ENCOUNTER — Ambulatory Visit (HOSPITAL_COMMUNITY)
Admission: RE | Admit: 2021-03-07 | Discharge: 2021-03-07 | Disposition: A | Discharge status: Home / Self Care | Payer: Medicare Other | Source: Ambulatory Visit | Attending: Cardiology | Admitting: Cardiology

## 2021-03-07 DIAGNOSIS — I5032 Chronic diastolic (congestive) heart failure: Secondary | ICD-10-CM | POA: Insufficient documentation

## 2021-03-07 DIAGNOSIS — I2510 Atherosclerotic heart disease of native coronary artery without angina pectoris: Secondary | ICD-10-CM | POA: Insufficient documentation

## 2021-03-07 DIAGNOSIS — Z79899 Other long term (current) drug therapy: Secondary | ICD-10-CM | POA: Insufficient documentation

## 2021-03-07 DIAGNOSIS — I350 Nonrheumatic aortic (valve) stenosis: Secondary | ICD-10-CM | POA: Diagnosis not present

## 2021-03-07 DIAGNOSIS — I442 Atrioventricular block, complete: Secondary | ICD-10-CM | POA: Insufficient documentation

## 2021-03-07 LAB — PULMONARY FUNCTION TEST
DL/VA % pred: 61 %
DL/VA: 2.54 ml/min/mmHg/L
DLCO unc % pred: 27 %
DLCO unc: 4.57 ml/min/mmHg
FEF 25-75 Post: 1.01 L/sec
FEF 25-75 Pre: 1.02 L/sec
FEF2575-%Change-Post: -1 %
FEF2575-%Pred-Post: 88 %
FEF2575-%Pred-Pre: 89 %
FEV1-%Change-Post: 9 %
FEV1-%Pred-Post: 73 %
FEV1-%Pred-Pre: 66 %
FEV1-Post: 1.17 L
FEV1-Pre: 1.07 L
FEV1FVC-%Change-Post: -1 %
FEV1FVC-%Pred-Pre: 107 %
FEV6-%Change-Post: 11 %
FEV6-%Pred-Post: 74 %
FEV6-%Pred-Pre: 66 %
FEV6-Post: 1.51 L
FEV6-Pre: 1.35 L
FEV6FVC-%Pred-Post: 106 %
FEV6FVC-%Pred-Pre: 106 %
FVC-%Change-Post: 11 %
FVC-%Pred-Post: 69 %
FVC-%Pred-Pre: 62 %
FVC-Post: 1.51 L
FVC-Pre: 1.35 L
Post FEV1/FVC ratio: 77 %
Post FEV6/FVC ratio: 100 %
Pre FEV1/FVC ratio: 79 %
Pre FEV6/FVC Ratio: 100 %
RV % pred: 120 %
RV: 2.74 L
TLC % pred: 89 %
TLC: 4.13 L

## 2021-03-07 MED ORDER — ALBUTEROL SULFATE (2.5 MG/3ML) 0.083% IN NEBU
2.5000 mg | INHALATION_SOLUTION | Freq: Once | RESPIRATORY_TRACT | Status: AC
  Administered 2021-03-07: 2.5 mg via RESPIRATORY_TRACT

## 2021-03-08 DIAGNOSIS — F32A Depression, unspecified: Secondary | ICD-10-CM | POA: Diagnosis not present

## 2021-03-08 DIAGNOSIS — E1165 Type 2 diabetes mellitus with hyperglycemia: Secondary | ICD-10-CM

## 2021-03-08 DIAGNOSIS — I480 Paroxysmal atrial fibrillation: Secondary | ICD-10-CM | POA: Diagnosis not present

## 2021-03-08 DIAGNOSIS — I5032 Chronic diastolic (congestive) heart failure: Secondary | ICD-10-CM

## 2021-03-11 ENCOUNTER — Telehealth: Payer: Self-pay

## 2021-03-22 ENCOUNTER — Telehealth: Payer: Self-pay

## 2021-03-22 NOTE — Progress Notes (Addendum)
HE[facility] AND [place]                                     Cardiology Office Note:    Date:  03/23/2021   ID:  Mary Mccarthy, DOB Jul 29, 1940, MRN [id number]  PCP:  Biagio Borg, MD  [facility] Cardiologist:  Glenetta Hew, MD  [facility] Electrophysiologist:  None   Referring MD: Biagio Borg, MD   Discuss LAAO with Watchman- scheduled for 3/9  History of Present Illness:    Mary Mccarthy is a 81 y.o. female with a hx of obesity, DMT2, non obstructive CAD, PUD/AVM with recurrent GI bleeding, iron deficiency anemia, PAF on amio and Eliquis (initially diagnosed at the time of acute CVA in 08/2020), HTN, pulmonary HTN, chronic diastolic CHF, and severe AS s/p TAVR (10/27/20) who presents to clinic to discuss Hardy implant with Watchman.  She was admitted in 2017 with urosepsis, AKI and acute anemia with a hemoglobin of 5 requiring transfusion.  She underwent EGD and colonoscopy which showed several areas of potential GI bleeding but no current bleeding.  She was found to have AVMs and peptic ulcer disease.   She was admitted in July 2022 with a stroke and diagnosed with new atrial fibrillation at that time.  MRI of the brain showed small acute infarct diffusion bilaterally. She was started on Eliquis.  Echo at that time showed EF 55 to 60%, moderately elevated pulmonary hypertension, severe concentric LVH, severe mitral annular calcification and severe aortic stenosis with a mean gradient of 42 mmHg, peak gradient of 76 mmHg, AVA of 0.73 cm, DVI 0.27.    She was then readmitted in August 2022 with acute on chronic diastolic CHF in the setting of severe aortic stenosis.  She was diuresed with IV Lasix.  She also had atrial fibrillation with RVR that converted to sinus with IV amiodarone.  Cardiology was consulted and she was seen by Dr. Burt Knack with the structural heart team.  She underwent left and right heart cath which showed nonobstructive  CAD with severe aortic stenosis with a mean gradient of 53 mmHg and pre TAVR CTs with plans for outpatient follow up. However, she was readmitted 9/12-9/22/22 for symptomatic anemia 2/2 GI bleed. Hg 7.4 on admission. Transfused with 1 unit PRBCs. FOBT +. EDG 9/15 showed gastritis with no signs of active bleeding but stigmata of recent bleeding.  It was suspected that the patient's anemia and melena was due to bleeding from gastric erosions in the setting of Eliquis use. Colonoscopy was deferred given severe AS and risk with sedation. She underwent successful TAVR with a 26 mm Medtronic Evolut Pro + THV via the TF approach on 10/27/20. She developed CHB intraoperatively that ultimately resolved. Post operative echo showed EF 60%, normally functioning TAVR with a mean gradient of 12 mmHg and trivial PVL. She was discharged on Eliquis without concurrent antiplatelet therapy given recent GI bleeding. She was also discharged with a Zio AT. Coreg held at DC. Monitor did not show any block or bradyarrhythmia.   She was then readmitted 9/26-9/29/22 for n/v/diarrhea and AKI and lactic acidosis which improved with fluids. Hg stable 8-9. Repeat Echo 11/02/20 with EF 60-65%, no RMWA, no AI or AS, 26 mm Sapien prosthetic (TAVR) valve present in the aortic position, normal structure and function of the aortic valve prosthesis. Tele showed intermittent A fib  RVR with ventricular rate up to 130s and NSVT lasting up to 9 runs. She was started on metoprolol XL 12.69m.   Later her HG was noted to drop to 7.6 and her Eliquis was held. She was referred to Dr. CBurt Knackfor consideration of Watchman. It was decided to start her back on Eliquis 2.5 mg BID and start the work up for [employer] The patient had a gated cardiac CTA study performed last year for her TAVR procedure.  Per review with Dr. NJohnsie Cancel the patient's anatomy is suitable for a FLX 264mor 3110m  Today the patient presents to clinic for follow up. No CP or SOB. No LE  edema, orthopnea or PND. No dizziness or syncope. No blood in stool or urine. No palpitations. Her breathing has improved quite a bit and she wants to know if she can come off QHS 02. Never has been test for OSA. Still deals with anxiety.   Past Medical History:  Diagnosis Date   ALLERGIC RHINITIS 06/24/2007   Anemia    AVM (arteriovenous malformation) of colon    Blood transfusion without reported diagnosis 05/2015   COLONIC POLYPS, HX OF 06/24/2007   Coronary artery disease, non-occlusive 05/2015   Trop + w/ Acute Anemia =>CATH: small RI - Ostial 60%, ostial RCA 30% and dLAD 40-50%;; 9/'21: Cor Ca Score 624. Mild (25-49%) prox RCA & LAD,; Moderate (50-69%) Ostial Small RI & prox LCx.     DEPRESSION 10/08/2006   DIABETES MELLITUS, TYPE II 10/05/2006   GERD 06/24/2007   History of CVA (cerebrovascular accident) 08/10/2020   08/2020- found to be in afib with RVR, started on Eliquis (shower of emboli from A. fib as complication of severe AS)   HYPERLIPIDEMIA 10/08/2006   HYPERTENSION 10/08/2006   INSOMNIA 09/24/2008   Left knee DJD    Nonsustained paroxysmal ventricular tachycardia 10/2018   Post-TAVR Zio Patch: Frequent (206) runs of NSVT - Fastest 5 beats 239 bpm. Longest 9.3 Sec avg 135 bpm. (? possibly Afib w/ aberrancy)   Osteoporosis 03/10/2016   PAF (paroxysmal atrial fibrillation) (HCC)    PAF with RVR: CHA2DS2-VASc 9.  On Eliquis and amiodarone. 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 McAAngelena FormDr. BarCyndia BentSevere aortic stenosis 08/2020   Progression from Mod-Severe AS to Severe AS - noted on Echo 08/2020 -> (mean gradient progressed from 34 to 42 mmHg) this was in setting of new onset A. fib RVR, acute diastolic HF and shower of emboli CVA. ->  Referred for TAVR, completed 10/26/2020    Past Surgical History:  Procedure Laterality Date    APPENDECTOMY     BIOPSY  10/21/2020   Procedure: BIOPSY;  Surgeon: DorSharyn CreamerD;  [facility];  Service: Gastroenterology;;   BREAST BIOPSY     CARDIAC CATHETERIZATION N/A 05/21/2015   Procedure: Left Heart Cath and Coronary Angiography;  Surgeon: DavLeonie ManD;  Location: [facility] [place] [facility];  Service: Cardiovascular;: Ost RI 60%, Ost RCA 30%, dLAD tapers to small vessel w/ 40-50%. Mildly elevated LVEDP. Normal LV Fxn.   COLONOSCOPY N/A 05/20/2015   Procedure: COLONOSCOPY;  Surgeon: SteManus GunningD;  [facility];  Laterality: N/A;   CORONARY CA2+ SCORE / CARDIAC CT ANGIOGRAM  10/09/2019   Calcium score 624.  82nd percentile.  Dominant RCA: Mild (25-49%) proximal stenosis-distal bifurcation into PDA and PAV--< RPL branches.  LAD (1 major mid vessel diagonal) diffuse calcified plaque, mild proximal stenosis with minimal distal stenosis.  Small RI moderate ostial disease.  LCx-moderate mixed (50-69%) proximal stenosis.  Small dOM1 disease.  Trileaflet AoV, annular Ca2+ - probable AS   ESOPHAGOGASTRODUODENOSCOPY N/A 05/20/2015   Procedure: ESOPHAGOGASTRODUODENOSCOPY (EGD);  Surgeon: Manus Gunning, MD;  Location: [place];  Service: Gastroenterology;  Laterality: N/A;   ESOPHAGOGASTRODUODENOSCOPY (EGD) WITH PROPOFOL N/A 10/21/2020   Procedure: ESOPHAGOGASTRODUODENOSCOPY (EGD) WITH PROPOFOL;  Surgeon: Sharyn Creamer, MD;  Location: [place];  Service: Gastroenterology;  Laterality: N/A;   INTRAOPERATIVE TRANSTHORACIC ECHOCARDIOGRAM N/A 10/26/2020   Procedure: INTRAOPERATIVE TRANSTHORACIC ECHOCARDIOGRAM;  Surgeon: Angelena Form; [facility]; Pre-TAVR well-visualized calcified AoV mean Grad 37 mmHg, AVA 0.72 cm => Post TAVR well-positioned supra-annular 26 mm Medtronic Evolut Pro valve placed with no PVL.  Mean gradient 14 mmHg.  AVR 1.58 cm.  Normal flow to RCA and LM-LAD.  ; EF 60 to 65%.  Degenerative severe MAC w/  mild MR.   RIGHT/LEFT HEART CATH AND CORONARY ANGIOGRAPHY N/A 09/29/2020   Procedure: RIGHT/LEFT HEART CATH AND CORONARY ANGIOGRAPHY;  Surgeon: Burnell Blanks, MD;  Location: [place] [facility];  Service: Cardiovascular; pre-TAVR:  pRCA 20%, m-dRCA 30%. D LM-pLAD 20%. RI 30%, mLAD 20%.  Severe AS with mean gradient measured at 52.8 mmHg.   TONSILLECTOMY     TRANSCATHETER AORTIC VALVE REPLACEMENT, TRANSFEMORAL N/A 10/26/2020   Procedure: TRANSCATHETER AORTIC VALVE REPLACEMENT, TRANSFEMORAL;  Surgeon: Burnell Blanks, MD;  [facility];  Service: Open Heart Surgery;; Medtronic Evolut-Pro + (size 26 mm, model # EVPROPLUS -26US, serial # [id number]); transient high-grade AV block noted so PPM left in place.   TRANSTHORACIC ECHOCARDIOGRAM  03/17/2019   a) 03/2019: EF 60 to 65%.  Moderate LVH.  GRII DD.  Mod-Severe AS (m grad 36 mmHg, peak 59 mmHg); b) 09/2019: EF 65-70%, No RWMA. Gr1 DD/hi LAP, Mild hi PAP. Mod LA Dil. MOD AS (mean Grad 34.5 mmHg).  STABLE   TRANSTHORACIC ECHOCARDIOGRAM  08/27/2020   Admitted for CVA/Afib RVR & CHF (Progression to SEVERE SYMPTOMATIC AS):  Severe Calcific Aortic Stenosis (VTI AVA estimated 0.86 cm, mean gradient 42 mmHg, V-max 4.36 m/s).  EF 55 to 60%.  Severe concentric LVH.  Unable to determine diastolic parameters.  Moderately elevated PAP.  Mild LA dilation.  Mild circumferential pericardial effusion.  Trivial MR.  Severe MAC.   TRANSTHORACIC ECHOCARDIOGRAM  11/25/2020   1 Month s/p TAVR: EF 55 to 60%.  No R WMA.  GR 1 DD.  Elevated LVEDP.  Normal RV size with mildly elevated RVP estimated 44 mmHg.  Oscillating density in the RV suspect calcified chordal apparatus versus calcified thrombus.  Moderate LA dilation.  Normal IVC/RA P => Well-positioned 26 mm Medtronic Evolut Pro THVwith mean AOV gradient 11 mmHg.  Trivial PVL   TRANSTHORACIC ECHOCARDIOGRAM  11/02/2020   a) Day 1 Post-op TAVR 10/27/20:  Well-Positioned Supra Annular Medtronic Evolut Pro THP.   Mean gradient 12 mmHg, peak 24 mmHg.  AVA 1.8 cm.  Trivial PVL.  EF 60 to 65%.  Normal LV function.  Mild LVH.  Severe LA dilation.  Mild RA dilation.  Severe MAC;; b) 11/02/20: Normal structure and function of the aortic valve prosthesis.  Mean gradient 9 mmHg (otherwise stable)   TUBAL LIGATION      Current Medications: Current Meds  Medication Sig   acetaminophen (TYLENOL)  500 MG tablet Take 500 mg by mouth every 6 (six) hours as needed for moderate pain.   ALPRAZolam (XANAX) 0.25 MG tablet TAKE 1 TABLET BY MOUTH THREE TIMES A DAY AS NEEDED FOR ANXIETY   amiodarone (PACERONE) 200 MG tablet Take 1 tablet (200 mg total) by mouth daily.   amLODipine (NORVASC) 10 MG tablet Take 1 tablet (10 mg total) by mouth daily.   apixaban (ELIQUIS) 2.5 MG TABS tablet Take 1 tablet (2.5 mg total) by mouth 2 (two) times daily.   atorvastatin (LIPITOR) 40 MG tablet TAKE 1 TABLET BY MOUTH EVERY DAY   blood glucose meter kit and supplies Dispense based on patient and insurance preference. Use up to four times daily as directed. E11.9   Blood Glucose Monitoring Suppl (ONE TOUCH ULTRA 2) w/Device KIT Use as directed daily   citalopram (CELEXA) 10 MG tablet Take 1 tablet (10 mg total) by mouth daily.   cyanocobalamin ([employer] VITAMIN B12) 1000 MCG tablet TAKE 1 TABLET BY MOUTH EVERY DAY   furosemide (LASIX) 40 MG tablet Take 1 tablet (40 mg total) by mouth daily.   Lancets (ONETOUCH DELICA PLUS [id number]) MISC USE AS DIRECTED TEST 1-2 TIMES A DAY   losartan (COZAAR) 50 MG tablet Take 0.5 tablets (25 mg total) by mouth daily.   metFORMIN (GLUCOPHAGE) 1000 MG tablet 1/2 tab by mouth twice per day   metoprolol succinate (TOPROL-XL) 25 MG 24 hr tablet Take 0.5 tablets (12.5 mg total) by mouth daily.   mirtazapine (REMERON) 15 MG tablet Take 15 mg by mouth at bedtime.   ONETOUCH ULTRA test strip USE AS INSTRUCTED TEST 1-2 TIMES A DAY   pantoprazole (PROTONIX) 40 MG tablet TAKE 1 TABLET BY MOUTH TWICE A DAY   potassium  chloride SA (KLOR-CON) 20 MEQ tablet Take 1 tablet (20 mEq total) by mouth daily.     Allergies:   Ibuprofen   Social History   Socioeconomic History   Marital status: Widowed    Spouse name: Not on file   Number of children: 1   Years of education: Not on file   Highest education level: Not on file  Occupational History   Occupation: retired - Social research officer, government: RETIRED  Tobacco Use   Smoking status: Never   Smokeless tobacco: Never  Vaping Use   Vaping Use: Never used  Substance and Sexual Activity   Alcohol use: No    Alcohol/week: 0.0 standard drinks   Drug use: No   Sexual activity: Not on file  Other Topics Concern   Not on file  Social History Narrative   Recently widowed--husband died in 11-Dec-2018.      Now lives alone but her son usually stays with her at night.  During the day she has 2 nephews who live nearby to come in and check on her intermittently.  Also one of her nieces calls routinely.      When her son is home and awake, she may try to walk on a treadmill, but is scared to walk outside.   Social Determinants of Health   Financial Resource Strain: Medium Risk   Difficulty of Paying Living Expenses: Somewhat hard  Food Insecurity: No Food Insecurity   Worried About Charity fundraiser in the Last Year: Never true   Ran Out of Food in the Last Year: Never true  Transportation Needs: No Transportation Needs   Lack of Transportation (Medical): No   Lack of Transportation (Non-Medical):  No  Physical Activity: Not on file  Stress: Stress Concern Present   Feeling of Stress : Rather much  Social Connections: Not on file     Family History: The patient's family history includes Alcohol abuse in an other family member; Arrhythmia in her sister; Arthritis in an other family member; CAD in her sister; Cancer in her mother; Depression in an other family member; Diabetes in an other family member; Heart attack (age of onset: 20) in her  sister; Heart disease in an other family member; Hyperlipidemia in an other family member; Hypertension in an other family member; Stroke in an other family member.  ROS:   Please see the history of present illness.    All other systems reviewed and are negative.  EKGs/Labs/Other Studies Reviewed:    The following studies were reviewed today:   TAVR OPERATIVE NOTE   Date of Procedure:                 10/26/2020   Preoperative Diagnosis:      Severe Aortic Stenosis    Postoperative Diagnosis:    Same    Procedure:        Transcatheter Aortic Valve Replacement - Percutaneous Right Transfemoral Approach             Medtronic Evolut-Pro + (size 26 mm, model # EVPROPLUS -26US, serial # [id number])              Co-Surgeons:            Gaye Pollack, MD and Lauree Chandler, MD     Anesthesiologist:                  Gennie Alma, MD   Echocardiographer:              Edmonia James, MD   Pre-operative Echo Findings: Severe aortic stenosis and moderate AI  Normal left ventricular systolic function   Post-operative Echo Findings: No paravalvular leak Normal left ventricular systolic function      _____________    Echo 11/02/20:  1. Left ventricular ejection fraction, by estimation, is 60 to 65%. The  left ventricle has normal function. The left ventricle has no regional  wall motion abnormalities. There is mild left ventricular hypertrophy.  Left ventricular diastolic parameters are indeterminate.   2. Right ventricular systolic function is normal. The right ventricular  size is normal.   3. Left atrial size was moderately dilated.   4. The mitral valve is normal in structure. No evidence of mitral valve  regurgitation. No evidence of mitral stenosis.   5. The aortic valve has been repaired/replaced. Aortic valve  regurgitation is not visualized. No aortic stenosis is present. There is a  26 mm Sapien prosthetic (TAVR) valve present in the aortic position.  Procedure Date:  10/26/2020. Echo findings are  consistent with normal structure and function of the aortic valve  prosthesis. Aortic valve area, by VTI measures 1.37 cm. Aortic valve mean gradient measures 9.0 mmHg. Aortic valve Vmax measures 2.16 m/s.   6. The inferior vena cava is normal in size with greater than 50%  respiratory variability, suggesting right atrial pressure of 3 mmHg.   _____________     Echo 10/27/20:  1. Left ventricular ejection fraction, by estimation, is 60 to 65%. The  left ventricle has normal function. The left ventricle has no regional  wall motion abnormalities. There is mild left ventricular hypertrophy.  Left ventricular diastolic parameters  are indeterminate.  2. Right ventricular systolic function is normal. The right ventricular  size is normal.   3. Left atrial size was severely dilated.   4. Right atrial size was mildly dilated.   5. The mitral valve is degenerative. Mild mitral valve regurgitation.  Severe mitral annular calcification.   6. Post TAVR with 26 mm supra annular Medtronic Evolut Pro valve trivial  PvL mean gradient 12 peak 24 mmHg AVA 1.8 cm2 with DVI 0.63. The aortic  valve has been repaired/replaced. Aortic valve regurgitation is trivial.  Procedure Date: 10/26/2020.     _____________     Elwyn Reach AT 9/22-10/5/22 Patch Wear Time:  12 days and 19 hours (2022-09-22T09:52:09-398 to 2022-10-05T05:32:17-0400)   Patient had a min HR of 49 bpm, max HR of 231 bpm, and avg HR of 74 bpm. Predominant underlying rhythm was Sinus Rhythm. 206 Ventricular Tachycardia runs occurred, the run with the fastest interval lasting 5 beats with a max rate of 231 bpm, the longest  lasting 9.3 secs with an avg rate of 175 bpm. Episode(s) of Ventricular Tachycardia may be possible Atrial Fibrillation with aberrancy. Atrial Fibrillation occurred (16% burden), ranging from 62-179 bpm (avg of 105 bpm), the longest lasting 17 hours 55  mins with an avg rate of 100 bpm. Some  episodes of Atrial Fibrillation conducted with possible aberrancy. Isolated SVEs were rare (<1.0%), SVE Couplets were rare (<1.0%), and SVE Triplets were rare (<1.0%). Isolated VEs were rare (<1.0%, 6271), VE  Couplets were rare (<1.0%, 2545), and VE Triplets were rare (<1.0%, 66). Difficulty discerning atrial activity making definitive diagnosis difficult to ascertain. Previously notified: MD notification criteria for First Documentation of Atrial  Fibrillation met - attempt was made to call account numerous times on 10/28/2020 at 8:18, 8:56 and 9:41 PM, left messages, no answer (BM).  _____________    Echo 11/25/20 IMPRESSIONS   1. Left ventricular ejection fraction, by estimation, is 55 to 60%. The  left ventricle has normal function. The left ventricle has no regional  wall motion abnormalities. There is mild left ventricular hypertrophy.  Left ventricular diastolic parameters  are consistent with Grade I diastolic dysfunction (impaired relaxation).  Elevated left ventricular end-diastolic pressure. The E/e' is 20.   2. Oscillating density seen in the RV, inferior to the TV (images 45-46),  suspect calcified chordal apparatus or less likely calcified  thrombus/vegetaton -clinical correlation advised. Right ventricular  systolic function is normal. The right ventricular   size is normal. There is mildly elevated pulmonary artery systolic  pressure. The estimated right ventricular systolic pressure is 50.5 mmHg.   3. Left atrial size was moderately dilated.   4. The mitral valve is abnormal. Trivial mitral valve regurgitation. No  evidence of mitral stenosis. Moderate mitral annular calcification.   5. The aortic valve has been repaired/replaced. Aortic valve  regurgitation is trivial. There is a 26 mm Medtronic Evolut valve present  in the aortic position. Procedure Date: 10/26/2020. Echo findings are  consistent with perivalvular leak of the aortic  prosthesis. Aortic valve area, by  VTI measures 2.05 cm. Aortic valve mean  gradient measures 11.0 mmHg. Aortic valve Vmax measures 2.44 m/s.   6. The inferior vena cava is normal in size with greater than 50%  respiratory variability, suggesting right atrial pressure of 3 mmHg.   Comparison(s): Changes from prior study are noted. 11/02/2020: LVEF 60-65%, TAVR with mean gradient of 9 mmHg, no perivalvular leak.    EKG:  EKG is ordered today.  This shows sinus HR  61 bpm  Recent Labs: 11/01/2020: B Natriuretic Peptide 124.6 02/02/2021: Magnesium 1.5; NT-Pro BNP 623 02/16/2021: ALT 12; BUN 24; Creatinine, Ser 1.23; Hemoglobin 7.9 Repeated and verified X2.; Platelets 245.0; Potassium 4.1; Sodium 138; TSH 7.54  Recent Lipid Panel    Component Value Date/Time   CHOL 92 02/16/2021 1455   TRIG 88.0 02/16/2021 1455   TRIG 90 12/01/2005 1330   HDL 34.60 (L) 02/16/2021 1455   CHOLHDL 3 02/16/2021 1455   VLDL 17.6 02/16/2021 1455   LDLCALC 40 02/16/2021 1455     Risk Assessment/Calculations:    CHA2DS2-VASc Score = 8   This indicates a 10.8% annual risk of stroke. The patient's score is based upon: CHF History: 0 HTN History: 1 Diabetes History: 1 Stroke History: 2 Vascular Disease History: 1 Age Score: 2 Gender Score: 1       Physical Exam:    VS:  BP 132/68    Pulse 61    Ht _0  (1.549 m)    Wt 190 lb (86.2 kg)    SpO2 96%    BMI 35.90 kg/m     Wt Readings from Last 3 Encounters:  03/23/21 190 lb (86.2 kg)  02/17/21 189 lb 6.4 oz (85.9 kg)  02/16/21 188 lb (85.3 kg)     GEN:  Well nourished, well developed in no acute distress, obese HEENT: Normal NECK: No JVD LYMPHATICS: No lymphadenopathy CARDIAC: RRR, 3/6 flow murmur. No rubs, gallops RESPIRATORY:  Clear to auscultation without rales, wheezing or rhonchi  ABDOMEN: Soft, non-tender, non-distended MUSCULOSKELETAL:  No edema; No deformity  SKIN: Warm and dry.   NEUROLOGIC:  Alert and oriented x 3 PSYCHIATRIC:  Normal affect   ASSESSMENT:     1. Paroxysmal atrial fibrillation (HCC)   2. Iron deficiency anemia due to chronic blood loss   3. History of GI bleed    PLAN:    In order of problems listed above:  She has a history of paroxysmal atrial fibrillation.  This patients CHA2DS2-VASc Score and unadjusted Ischemic Stroke Rate (% per year) is equal to 10.8 % stroke rate/year from a score of 8 which necessitates long term oral anticoagulation to prevent stroke. HasBled score is:   HAS-BLED score  Hypertension No  Abnormal renal and liver function (Dialysis, transplant, Cr >2.26 mg/dL /Cirrhosis or Bilirubin >2x Normal or AST/ALT/AP >3x Normal) No  Stroke Yes  Bleeding Yes  Labile INR (Unstable/high INR) No  Elderly (>65) Yes  Drugs or alcohol (? 8 drinks/week, anti-plt or NSAID) No    Unfortunately, she is not felt to be a long term anticoagulation candidate secondary to GI bleeding and iron deficiency anemia.  The patients chart has been reviewed and Dr Burt Knack along with their referring cardiologist feel that they would be a candidate for short term oral anticoagulation.She has been on Eliquis 2.68m BID and tolerating this well.  Procedural risks for the Watchman implant have been reviewed with the patient including a 1% risk of stroke, 1% risk of perforation or pericardial effusion, 0.1% risk of device embolization.  Given the patient's poor candidacy for long-term oral anticoagulation, ability to tolerate short term oral anticoagulation, we have recommended the Watchman FLX left atrial appendage closure device through a shared decision making conversation with the patient.  The patient had a gated cardiac CTA study performed last year for her TAVR procedure.  Per Dr. NJohnsie Cancel the patient's anatomy is suitable for a FLX 216mor 3132m  Her Watchman has been set  up for 04/14/21. CBC and BMET today.   Medication Adjustments/Labs and Tests Ordered: Current medicines are reviewed at length with the patient today.  Concerns  regarding medicines are outlined above.  Orders Placed This Encounter  Procedures   CBC   Basic metabolic panel   EKG [id number]     No orders of the defined types were placed in this encounter.    Patient Instructions  Medication Instructions:  Your physician recommends that you continue on your current medications as directed. Please refer to the Current Medication list given to you today.  *If you need a refill on your cardiac medications before your next appointment, please call your pharmacy*   Lab Work: TODAY: BMET, CBC If you have labs (blood work) drawn today and your tests are completely normal, you will receive your results only by: [place] (if you have MyChart) OR A paper copy in the mail If you have any lab test that is abnormal or we need to change your treatment, we will call you to review the results.   Testing/Procedures: REFER TO PROCEDURE LETTER   Follow-Up: At [facility], you and your health needs are our priority.  As part of our continuing mission to provide you with exceptional heart care, we have created designated Provider Care Teams.  These Care Teams include your primary Cardiologist (physician) and Advanced Practice Providers (APPs -  Physician Assistants and Nurse Practitioners) who all work together to provide you with the care you need, when you need it.  We recommend signing up for the patient portal called "MyChart".  Sign up information is provided on this After Visit Summary.  MyChart is used to connect with patients for Virtual Visits (Telemedicine).  Patients are able to view lab/test results, encounter notes, upcoming appointments, etc.  Non-urgent messages can be sent to your provider as well.   To learn more about what you can do with MyChart, go to [email or web address].    Your next appointment:   KEEP SCHEDULED FOLLOW-UP   Signed, Angelena Form, PA-C  03/23/2021 2:47 PM    [place]

## 2021-03-22 NOTE — Telephone Encounter (Signed)
Due to provider schedule change, the patient agrees to reschedule LAAO procedure to 04/14/2021.  She understands she will receive her instructions for procedure tomorrow at pre-procedure visit.  She was grateful for call and agrees with plan.

## 2021-03-23 ENCOUNTER — Other Ambulatory Visit: Payer: Self-pay

## 2021-03-23 ENCOUNTER — Ambulatory Visit: Payer: Medicare Other | Admitting: Physician Assistant

## 2021-03-23 VITALS — BP 132/68 | HR 61 | Ht 61.0 in | Wt 190.0 lb

## 2021-03-23 DIAGNOSIS — D500 Iron deficiency anemia secondary to blood loss (chronic): Secondary | ICD-10-CM | POA: Diagnosis not present

## 2021-03-23 DIAGNOSIS — I480 Paroxysmal atrial fibrillation: Secondary | ICD-10-CM | POA: Diagnosis not present

## 2021-03-23 DIAGNOSIS — Z8719 Personal history of other diseases of the digestive system: Secondary | ICD-10-CM

## 2021-03-23 NOTE — Patient Instructions (Signed)
Medication Instructions:  Your physician recommends that you continue on your current medications as directed. Please refer to the Current Medication list given to you today.  *If you need a refill on your cardiac medications before your next appointment, please call your pharmacy*   Lab Work: TODAY: BMET, CBC If you have labs (blood work) drawn today and your tests are completely normal, you will receive your results only by: MyChart Message (if you have MyChart) OR A paper copy in the mail If you have any lab test that is abnormal or we need to change your treatment, we will call you to review the results.   Testing/Procedures: REFER TO PROCEDURE LETTER   Follow-Up: At [facility], you and your health needs are our priority.  As part of our continuing mission to provide you with exceptional heart care, we have created designated Provider Care Teams.  These Care Teams include your primary Cardiologist (physician) and Advanced Practice Providers (APPs -  Physician Assistants and Nurse Practitioners) who all work together to provide you with the care you need, when you need it.  We recommend signing up for the patient portal called "MyChart".  Sign up information is provided on this After Visit Summary.  MyChart is used to connect with patients for Virtual Visits (Telemedicine).  Patients are able to view lab/test results, encounter notes, upcoming appointments, etc.  Non-urgent messages can be sent to your provider as well.   To learn more about what you can do with MyChart, go to [email or web address].    Your next appointment:   KEEP SCHEDULED FOLLOW-UP

## 2021-03-24 ENCOUNTER — Other Ambulatory Visit: Payer: Self-pay | Admitting: Physician Assistant

## 2021-03-24 ENCOUNTER — Other Ambulatory Visit (HOSPITAL_COMMUNITY): Payer: Self-pay | Admitting: *Deleted

## 2021-03-24 ENCOUNTER — Ambulatory Visit (INDEPENDENT_AMBULATORY_CARE_PROVIDER_SITE_OTHER): Payer: Medicare Other

## 2021-03-24 ENCOUNTER — Telehealth: Payer: Self-pay | Admitting: Cardiology

## 2021-03-24 DIAGNOSIS — I480 Paroxysmal atrial fibrillation: Secondary | ICD-10-CM

## 2021-03-24 DIAGNOSIS — D500 Iron deficiency anemia secondary to blood loss (chronic): Secondary | ICD-10-CM

## 2021-03-24 DIAGNOSIS — E785 Hyperlipidemia, unspecified: Secondary | ICD-10-CM

## 2021-03-24 DIAGNOSIS — E1165 Type 2 diabetes mellitus with hyperglycemia: Secondary | ICD-10-CM

## 2021-03-24 DIAGNOSIS — I5032 Chronic diastolic (congestive) heart failure: Secondary | ICD-10-CM

## 2021-03-24 DIAGNOSIS — N179 Acute kidney failure, unspecified: Secondary | ICD-10-CM

## 2021-03-24 LAB — BASIC METABOLIC PANEL
BUN/Creatinine Ratio: 15 (ref 12–28)
BUN: 26 mg/dL (ref 8–27)
CO2: 21 mmol/L (ref 20–29)
Calcium: 9.4 mg/dL (ref 8.7–10.3)
Chloride: 106 mmol/L (ref 96–106)
Creatinine, Ser: 1.68 mg/dL — ABNORMAL HIGH (ref 0.57–1.00)
Glucose: 132 mg/dL — ABNORMAL HIGH (ref 70–99)
Potassium: 4.2 mmol/L (ref 3.5–5.2)
Sodium: 144 mmol/L (ref 134–144)
eGFR: 30 mL/min/{1.73_m2} — ABNORMAL LOW (ref >59)

## 2021-03-24 LAB — CBC
Hematocrit: 23.1 % — ABNORMAL LOW (ref 34.0–46.6)
Hemoglobin: 6.8 g/dL — CL (ref 11.1–15.9)
MCH: 23.4 pg — ABNORMAL LOW (ref 26.6–33.0)
MCHC: 29.4 g/dL — ABNORMAL LOW (ref 31.5–35.7)
MCV: 80 fL (ref 79–97)
Platelets: 217 10*3/uL (ref 150–450)
RBC: 2.9 x10E6/uL — ABNORMAL LOW (ref 3.77–5.28)
RDW: 16.5 % — ABNORMAL HIGH (ref 11.7–15.4)
WBC: 5 10*3/uL (ref 3.4–10.8)

## 2021-03-24 NOTE — Progress Notes (Signed)
Chronic Care Management Pharmacy Note  03/24/2021 Name:  Mary Mccarthy MRN:  [id number] DOB:  [birth year]  Summary: -Per patient eliquis was stopped today by cardiology due to low Hgb - scheduled for blood transfusion tomorrow  -Patient has watchman procedure scheduled for 04/14/2021 -Last eGFR decreased to 74mL/min - will have repeat tomorrow, and possibly will have BMP weekly until procedure - will continue to monitor kidney function  -Patient has been monitoring BG - averaging 150-190 -Reports that citalopram has been helpful in control of depression   Recommendations/Changes made from today's visit: -Recommended no changes to medications at this time - will continue to monitor kidney function as metformin will need to be held should eGFR <30  -Will recheck status of BG control in 4-6 weeks - possible additional medication will be needed for control A1c   Subjective: Mary Mccarthy is an 81 y.o. year old female who is a primary patient of Jenny Reichmann, Hunt Oris, MD.  The CCM team was consulted for assistance with disease management and care coordination needs.    Engaged with patient by telephone for follow up visit in response to provider referral for pharmacy case management and/or care coordination services.   Consent to Services:  The patient was given the following information about Chronic Care Management services today, agreed to services, and gave verbal consent: 1. CCM service includes personalized support from designated clinical staff supervised by the primary care provider, including individualized plan of care and coordination with other care providers 2. 24/7 contact phone numbers for assistance for urgent and routine care needs. 3. Service will only be billed when office clinical staff spend 20 minutes or more in a month to coordinate care. 4. Only one practitioner may furnish and bill the service in a calendar month. 5.The patient may stop CCM services at any time (effective at the  end of the month) by phone call to the office staff. 6. The patient will be responsible for cost sharing (co-pay) of up to 20% of the service fee (after annual deductible is met). Patient agreed to services and consent obtained.  Patient Care Team: Biagio Borg, MD as PCP - General (Internal Medicine) Leonie Man, MD as PCP - Cardiology (Cardiology) Knox Royalty, RN as Case Manager Charlton Haws, [facility] as Pharmacist (Pharmacist)  Recent office visits: 02/16/2021 - Dr. Jenny Reichmann - citalopram $RemoveBefo'10mg'aEioyqRVJhp$  daily - losartan $RemoveBe'25mg'TBmojyIri$  daily, metformin $RemoveBeforeDE'500mg'FHoEmcpYRvXVZdS$  BID   Recent consult visits: 03/24/2021 - Angelena Form PA-C - Cardiology - no changes to medications - patient scheduled for watchman device procedure 04/14/2021 02/17/2021 - Dr. Burt Knack - Cardiology - watchman device evaluation - patient scheduled for watch device procedure   Hospital visits: None since last visit   Objective:  Lab Results  Component Value Date   CREATININE 1.68 (H) 03/23/2021   BUN 26 03/23/2021   GFR 40.96 (L) 02/16/2021   GFRNONAA 56 (L) 11/03/2020   GFRAA 57 (L) 10/01/2019   NA 144 03/23/2021   K 4.2 03/23/2021   CALCIUM 9.4 03/23/2021   CO2 21 03/23/2021   GLUCOSE 132 (H) 03/23/2021    Lab Results  Component Value Date/Time   HGBA1C 7.5 (H) 02/16/2021 02:55 PM   HGBA1C 5.8 (H) 10/26/2020 03:42 AM   GFR 40.96 (L) 02/16/2021 02:55 PM   GFR 43.16 (L) 10/13/2020 02:18 PM   MICROALBUR <0.7 02/16/2021 02:55 PM   MICROALBUR 1.8 03/18/2020 11:25 AM    Last diabetic Eye exam:  Lab Results  Component Value Date/Time  HMDIABEYEEXA No Retinopathy 07/16/2018 12:00 AM    Last diabetic Foot exam: No results found for: HMDIABFOOTEX   Lab Results  Component Value Date   CHOL 92 02/16/2021   HDL 34.60 (L) 02/16/2021   LDLCALC 40 02/16/2021   TRIG 88.0 02/16/2021   CHOLHDL 3 02/16/2021    Hepatic Function Latest Ref Rng & Units 02/16/2021 02/02/2021 01/05/2021  Total Protein 6.0 - 8.3 g/dL 7.2 6.6 7.0  Albumin  3.5 - 5.2 g/dL 3.9 3.9 4.2  AST 0 - 37 U/L 18 20 17   ALT 0 - 35 U/L 12 15 10   Alk Phosphatase 39 - 117 U/L 43 60 52  Total Bilirubin 0.2 - 1.2 mg/dL 0.6 0.5 0.4  Bilirubin, Direct 0.0 - 0.3 mg/dL 0.1 - -    Lab Results  Component Value Date/Time   TSH 7.54 (H) 02/16/2021 02:55 PM   TSH 5.820 (H) 11/11/2020 02:57 PM    CBC Latest Ref Rng & Units 03/23/2021 02/16/2021 02/11/2021  WBC 3.4 - 10.8 x10E3/uL 5.0 5.6 4.2  Hemoglobin 11.1 - 15.9 g/dL 6.8(LL) 7.9 Repeated and verified X2.(LL) 7.6(L)  Hematocrit 34.0 - 46.6 % 23.1(L) 25.5 Repeated and verified X2.(L) 25.2(L)  Platelets 150 - 450 x10E3/uL 217 245.0 246    Lab Results  Component Value Date/Time   VD25OH 22.16 (L) 02/16/2021 02:55 PM   VD25OH 14.34 (L) 03/18/2020 11:25 AM    Clinical ASCVD: Yes  The ASCVD Risk score (Arnett DK, et al., 2019) failed to calculate for the following reasons:   The 2019 ASCVD risk score is only valid for ages 56 to 36    Depression screen PHQ 2/9 02/16/2021 12/01/2020 03/18/2020  Decreased Interest 0 0 0  Down, Depressed, Hopeless 1 1 1   PHQ - 2 Score 1 1 1   Altered sleeping - - -  Tired, decreased energy - - -  Change in appetite - - -  Feeling bad or failure about yourself  - - -  Trouble concentrating - - -  Moving slowly or fidgety/restless - - -  Suicidal thoughts - - -  PHQ-9 Score - - -  Difficult doing work/chores - - -  Some recent data might be hidden     CHA2DS2-VASc Score = 8  The patient's score is based upon: CHF History: 0 HTN History: 1 Diabetes History: 1 Stroke History: 2 Vascular Disease History: 1 Age Score: 2 Gender Score: 1        Social History   Tobacco Use  Smoking Status Never  Smokeless Tobacco Never   BP Readings from Last 3 Encounters:  03/23/21 132/68  02/17/21 126/70  02/16/21 122/60   Pulse Readings from Last 3 Encounters:  03/23/21 61  02/17/21 (!) 54  02/16/21 (!) 54   Wt Readings from Last 3 Encounters:  03/23/21 190 lb (86.2 kg)   02/17/21 189 lb 6.4 oz (85.9 kg)  02/16/21 188 lb (85.3 kg)   BMI Readings from Last 3 Encounters:  03/23/21 35.90 kg/m  02/17/21 35.79 kg/m  02/16/21 35.52 kg/m    Assessment/Interventions: Review of patient past medical history, allergies, medications, health status, including review of consultants reports, laboratory and other test data, was performed as part of comprehensive evaluation and provision of chronic care management services.   SDOH:  (Social Determinants of Health) assessments and interventions performed: Yes  SDOH Screenings   Alcohol Screen: Not on file  Depression (PHQ2-9): Low Risk    PHQ-2 Score: 1  Financial Resource Strain: Medium Risk  Difficulty of Paying Living Expenses: Somewhat hard  Food Insecurity: No Food Insecurity   Worried About Charity fundraiser in the Last Year: Never true   Ran Out of Food in the Last Year: Never true  Housing: [place] Risk Score: 0  Physical Activity: Not on file  Social Connections: Not on file  Stress: Stress Concern Present   Feeling of Stress : Rather much  Tobacco Use: Low Risk    Smoking Tobacco Use: Never   Smokeless Tobacco Use: Never   Passive Exposure: Not on file  Transportation Needs: No Transportation Needs   Lack of Transportation (Medical): No   Lack of Transportation (Non-Medical): No    CCM Care Plan  Allergies  Allergen Reactions   Ibuprofen Other (See Comments)    Bleeding events    Medications Reviewed Today     Reviewed by Tomasa Blase, [facility] (Pharmacist) on 03/24/21 at 1445  Med List Status: <None>   Medication Order Taking? Sig Documenting Provider Last Dose Status Informant  acetaminophen (TYLENOL) 500 MG tablet [id number] Yes Take 500 mg by mouth every 6 (six) hours as needed for moderate pain. [provider] Taking Active Self  ALPRAZolam Duanne Moron) 0.25 MG tablet [id number] Yes TAKE 1 TABLET BY MOUTH THREE TIMES A DAY AS NEEDED FOR ANXIETY Biagio Borg,  MD Taking Active   amiodarone (PACERONE) 200 MG tablet [id number] Yes Take 1 tablet (200 mg total) by mouth daily. Eileen Stanford, PA-C Taking Active Self  amLODipine (NORVASC) 10 MG tablet [id number] Yes Take 1 tablet (10 mg total) by mouth daily. Dwyane Dee, MD Taking Active   apixaban (ELIQUIS) 2.5 MG TABS tablet [id number] No Take 1 tablet (2.5 mg total) by mouth 2 (two) times daily.  Patient not taking: Reported on 03/24/2021   Sherren Mocha, MD Not Taking Active   atorvastatin (LIPITOR) 40 MG tablet [id number] Yes TAKE 1 TABLET BY MOUTH EVERY DAY Leonie Man, MD Taking Active Self  blood glucose meter kit and supplies [id number] Yes Dispense based on patient and insurance preference. Use up to four times daily as directed. E11.9 Biagio Borg, MD Taking Active Self  Blood Glucose Monitoring Suppl (ONE TOUCH ULTRA 2) w/Device Drucie Opitz [id number] Yes Use as directed daily Biagio Borg, MD Taking Active Self  citalopram (CELEXA) 10 MG tablet [id number] Yes Take 1 tablet (10 mg total) by mouth daily. Biagio Borg, MD Taking Active   cyanocobalamin ([facility] VITAMIN B12) 1000 MCG tablet [id number] Yes TAKE 1 TABLET BY MOUTH EVERY DAY Biagio Borg, MD Taking Active   furosemide (LASIX) 40 MG tablet [id number] Yes Take 1 tablet (40 mg total) by mouth daily. Eileen Stanford, PA-C Taking Active Self  Lancets (ONETOUCH DELICA PLUS [id number]) [place] [id number] Yes USE AS DIRECTED TEST 1-2 TIMES A DAY Biagio Borg, MD Taking Active Self  losartan (COZAAR) 50 MG tablet [id number] Yes Take 0.5 tablets (25 mg total) by mouth daily. Biagio Borg, MD Taking Active   metFORMIN (GLUCOPHAGE) 1000 MG tablet [id number] Yes 1/2 tab by mouth twice per day Biagio Borg, MD Taking Active   metoprolol succinate (TOPROL-XL) 25 MG 24 hr tablet [id number] Yes Take 0.5 tablets (12.5 mg total) by mouth daily. Dwyane Dee, MD Taking Active   mirtazapine (REMERON) 15 MG tablet [id number] Yes Take 15 mg by mouth at bedtime.  Biagio Borg, MD Taking Active Self  [facility] ULTRA test strip [id number] Yes USE AS  INSTRUCTED TEST 1-2 TIMES A DAY Biagio Borg, MD Taking Active Self  pantoprazole (PROTONIX) 40 MG tablet [id number] Yes TAKE 1 TABLET BY MOUTH TWICE A DAY Eileen Stanford, PA-C Taking Active   potassium chloride SA (KLOR-CON) 20 MEQ tablet [id number] Yes Take 1 tablet (20 mEq total) by mouth daily. Eileen Stanford, PA-C Taking Active Self            Patient Active Problem List   Diagnosis Date Noted   Encounter for well adult exam with abnormal findings 02/16/2021   Long term current use of amiodarone for rate-rhythm control of A. fib 11/28/2020   Chronic respiratory failure ([place]) 11/22/2020   Dehydration 11/02/2020   Diabetes mellitus type 2 in obese ([place]) 11/02/2020   AKI (acute kidney injury) ([place]) 11/01/2020   Transient post TAVR CHB -> not seen on follow-up Zio patch 10/28/2020   S/P TAVR (transcatheter aortic valve replacement) 10/26/2020   Melena 10/20/2020   Symptomatic anemia 10/19/2020   Acute bronchitis 09/21/2020   Current use of long term anticoagulation - for Afib; CHA2DS2-VASc 9, on Eliquis 08/23/2020   PAF with RVR: CHA2DS2-VASc 9.  On Eliquis and amiodarone. 08/11/2020   History of CVA (cerebrovascular accident) 08/10/2020   Hypomagnesemia 08/10/2020   Severe aortic stenosis 08/2020   Aortic atherosclerosis ([place]) 03/18/2020   B12 deficiency 09/21/2019   Cough 03/14/2018   Wheezing 03/14/2018   Anxiety 09/11/2017   Osteoporosis 03/10/2016   Coronary artery disease, non-occlusive 05/24/2015   GI bleed    Pulmonary hypertension (HCC)    CHF (congestive heart failure), NYHA class II, chronic, diastolic ([place]) 47/10/6281   Gastric ulceration    AVM (arteriovenous malformation) of colon    Demand ischemia of myocardium ([place])    Right knee pain 05/21/2014   Anemia, iron deficiency 05/20/2013   MENOPAUSAL DISORDER 12/07/2009   INSOMNIA 09/24/2008   ALLERGIC RHINITIS  06/24/2007   GERD 06/24/2007   COLONIC POLYPS, HX OF 06/24/2007   Hyperlipidemia with target LDL less than 70 10/08/2006   Anxiety with depression 10/08/2006   Essential hypertension 10/08/2006   Peptic ulcer 10/08/2006   Type 2 diabetes mellitus with hyperglycemia, without long-term current use of insulin ([place]) 10/05/2006   Morbid obesity ([place]) 10/05/2006    Immunization History  Administered Date(s) Administered   Fluad Quad(high Dose 65+) 12/26/2018, 12/06/2019, 10/13/2020   Influenza Split 12/21/2010, 12/28/2011   Influenza Whole 12/01/2005, 03/26/2009, 12/07/2009   Influenza, High Dose Seasonal PF 11/19/2012, 11/18/2013, 02/18/2015, 12/28/2015, 12/07/2016, 10/17/2017   PFIZER(Purple Top)SARS-COV-2 Vaccination 05/01/2019, 05/26/2019   Pneumococcal Conjugate-13 12/03/2012   Pneumococcal Polysaccharide-23 11/06/2005   Td 09/24/2008   Tdap 09/13/2018, 09/13/2018    Conditions to be addressed/monitored:  Hypertension, Hyperlipidemia, Diabetes, Atrial Fibrillation, Heart Failure, Coronary Artery Disease, and Anxiety  Care Plan : CCM Pharmacy Care Plan  Updates made by Tomasa Blase, RPH since 03/24/2021 12:00 AM     Problem: Hypertension, Hyperlipidemia, Diabetes, Atrial Fibrillation, Heart Failure, Coronary Artery Disease, and Anxiety   Priority: High     Long-Range Goal: Disease management   Start Date: 11/22/2020  Expected End Date: 11/22/2021  This Visit's Progress: On track  Recent Progress: On track  Priority: High  Note:   Current Barriers:  Unable to independently monitor therapeutic efficacy  Pharmacist Clinical Goal(s):  Patient will achieve adherence to monitoring guidelines and medication adherence to achieve therapeutic efficacy achieve control of anxiety as evidenced by patient report through collaboration with PharmD and provider.   Interventions: 1:1  collaboration with Biagio Borg, MD regarding development and update of comprehensive plan of care  as evidenced by provider attestation and co-signature Inter-disciplinary care team collaboration (see longitudinal plan of care) Comprehensive medication review performed; medication list updated in electronic medical record  Hyperlipidemia: (LDL goal < 70) -Controlled - LDL is at goal; pt endorses compliance with statin and denies issues -Hx stroke (08/2020) and aortic atherosclerosis; coronary CTA w/o evidence of obstructive disease, calcium score 625 Lab Results  Component Value Date   LDLCALC 40 02/16/2021  -Current treatment: Atorvastatin 40 mg daily AM -Educated on Cholesterol goals; Benefits of statin for ASCVD risk reduction; -Recommended to continue current medication  Diabetes (A1c goal <7%) -BG slightly elevated, latest A1c above goal - recent BG averaging 150-190 Lab Results  Component Value Date   HGBA1C 7.5 (H) 02/16/2021  -Current medications: Metformin 500mg  BID  -Medications previously tried: glimepiride   -Educated on A1c and blood sugar goals; Complications of diabetes including kidney damage, retinal damage, and cardiovascular disease; -Recommended to continue current dosage of medications - will continue to monitor kidney function - metformin will need to be held should eGFR <30  -Patient to continue monitoring blood sugars and reach out should BG average >150 fasted / >180 post prandial   Atrial Fibrillation (Goal: prevent stroke and major bleeding) -Controlled - pt holding eliquis at this time due to low Hgb per cardiology - scheduled for blood transfusion tomorrow - and scheduled for watchman procedure 04/14/2021 - cardiology will keep her updated with eliquis instructions  -Dx 08/2020 in s/o acute CVA -Hx complete heart block, aortic stenosis (s/p TAVR 10/26/20) -CHADSVASC: 8 -Current treatment: Amiodarone 200 mg daily AM Metoprolol succinate 25 mg - 1/2 tab daily Eliquis 2.5 mg BID - on hold at this time  -Counseled on increased risk of stroke due to Afib and  benefits of anticoagulation for stroke prevention; bleeding risk associated with Eliquis and importance of self-monitoring for signs/symptoms of bleeding; avoidance of NSAIDs due to increased bleeding risk with anticoagulants; -Recommended to continue current medication  Heart Failure / Hypertension (Goal: BP < 130/80 and prevent exacerbations) -Controlled - pt reports compliance with medications as prescribed; she reports weighing herself daily -Current home BP/HR readings: does not monitor at home   BP Readings from Last 3 Encounters:  03/23/21 132/68  02/17/21 126/70  02/16/21 122/60   Pulse Readings from Last 3 Encounters:  03/23/21 61  02/17/21 (!) 54  02/16/21 (!) 54    -Last ejection fraction: 60-65% (Date: 10/26/20) -HF type: Diastolic -Current treatment: Amlodipine 10 mg daily Furosemide 40 mg daily AM Losartan 25 mg daily - HS Metoprolol succinate 25 mg - 1/2 tab daily [place] Con 20 mEq daily AM -Educated on Benefits of medications for managing symptoms and prolonging life; Importance of weighing daily; if you gain more than 3 pounds in one day or 5 pounds in one week, call cardiology; Proper diuretic administration and potassium supplementation; Importance of blood pressure control -Recommended to move amlodipine and metoprolol to bedtime  Depression/Anxiety (Goal: manage symptoms) -Improved- feels that she is doing better, has been taking xanax as needed (typically twice daily) feels that mirtazapine has been effective for her as well -Current treatment: Alprazolam 0.25 mg TID prn  Mirtazapine 15mg  hs  Citalopram 10mg  daily  -Medications previously tried/failed: clonazepam -Connected with PCP for mental health support -Educated on Benefits of medication for symptom control; Benefits of cognitive-behavioral therapy with or without medication  -Recommended to continue current medications, patient to  reach out should she feel mental health is not improving/ under control     GI bleed (Goal: manage symptoms) -Controlled - pt endorses compliance with daily PPI; she denies s/sx of bleeding -Hx GI bleed 09/2020 in s/o Eliquis. Stopped aspirin at that time. -Current treatment  Pantoprazole 40 mg BID -Counseled on benefits of PPI for preventing GI bleed; advised to avoid NSAIDs -Recommended to continue current medication  Health Maintenance -Vaccine gaps: COVID booster -Current therapy:  Tylenol 500 mg BID Vitamin B12 1000 mcg daily -Recommended to continue current medication  Patient Goals/Self-Care Activities Patient will:  - take medications as prescribed - reduce carb intake - continue to monitor BG  focus on medication adherence by pill box weigh daily, and contact provider if weight gain of 5+ lbs in a week, contact cardiology          Medication Assistance: None required.  Patient affirms current coverage meets needs.  Compliance/Adherence/Medication fill history: Care Gaps: Eye exam (08/05/19)  Patient's preferred pharmacy is:  [facility] [place], [place] - [address] [place] [address] Phone: [phone] Fax: [phone]  [id number] [place], [place] [address] [place] [address] Phone: [phone] Fax: [phone]  [facility] [address] [place] [address] Phone: [phone] Fax: [phone]   Uses pill box? Yes Pt endorses 100% compliance  We discussed: Current pharmacy is preferred with insurance plan and patient is satisfied with pharmacy services Patient decided to: Continue current medication management strategy  Care Plan and Follow Up Patient Decision:  Patient agrees to Care Plan and Follow-up.  Plan: Telephone follow up appointment with care management team member scheduled for:  2 months The patient has been provided with contact  information for the care management team and has been advised to call with any health related questions or concerns.    Tomasa Blase, PharmD Clinical Pharmacist, [place]

## 2021-03-24 NOTE — Telephone Encounter (Signed)
Patient seen yesterday afternoon for follow up OV, found to have a Hb at 6.8 on lab work. Spoke with Mary Jews, PA-C regarding plan. She is scheduled for OP Type and Screen and 1 PRBC infusion 03/25/21 at 9am in the Infusion Clinic here at [facility]. Spoke with patient to confirm appointment. She understands and agrees with plan. She has taken her Eliquis already this AM. We will confirm definitive anticoagulation plan and call her back later this AM to discuss.   Mary Chard NP-C Structural Heart Team  Pager: [phone] Phone: [phone]

## 2021-03-24 NOTE — Patient Instructions (Signed)
Visit Information  Following are the goals we discussed today:   Manage My Medicine   Timeframe:  Long-Range Goal Priority:  High Start Date:       11/22/20                      Expected End Date:    11/22/21                   Follow Up Date March 2023   - call for medicine refill 2 or 3 days before it runs out - call if I am sick and can't take my medicine - keep a list of all the medicines I take; vitamins and herbals too - use a pillbox to sort medicine  -weigh daily, and contact provider if weight gain of 5+ lbs in a week, contact cardiology   Why is this important?   These steps will help you keep on track with your medicines.   Plan: Telephone follow up appointment with care management team member scheduled for:  4-6 weeks  The patient has been provided with contact information for the care management team and has been advised to call with any health related questions or concerns.   Ellin Saba, PharmD Clinical Pharmacist, [facility]   Please call the care guide team at [phone] if you need to cancel or reschedule your appointment.   The patient verbalized understanding of instructions, educational materials, and care plan provided today and declined offer to receive copy of patient instructions, educational materials, and care plan.

## 2021-03-25 ENCOUNTER — Ambulatory Visit (HOSPITAL_COMMUNITY)
Admission: RE | Admit: 2021-03-25 | Discharge: 2021-03-25 | Disposition: A | Discharge status: Home / Self Care | Payer: Medicare Other | Source: Ambulatory Visit | Attending: Cardiology | Admitting: Cardiology

## 2021-03-25 ENCOUNTER — Other Ambulatory Visit: Payer: Self-pay

## 2021-03-25 DIAGNOSIS — D649 Anemia, unspecified: Secondary | ICD-10-CM | POA: Diagnosis not present

## 2021-03-25 LAB — PREPARE RBC (CROSSMATCH)

## 2021-03-25 MED ORDER — SODIUM CHLORIDE 0.9% IV SOLUTION: Freq: Once | INTRAVENOUS | Status: DC

## 2021-03-26 LAB — BPAM RBC
Blood Product Expiration Date: 202302232359
ISSUE DATE / TIME: 202302170959
Unit Type and Rh: 6200

## 2021-03-26 LAB — TYPE AND SCREEN
ABO/RH(D): A POS
Antibody Screen: NEGATIVE
Unit division: 0

## 2021-03-27 DIAGNOSIS — I509 Heart failure, unspecified: Secondary | ICD-10-CM | POA: Diagnosis not present

## 2021-03-28 ENCOUNTER — Ambulatory Visit: Payer: Commercial Managed Care - HMO

## 2021-03-28 ENCOUNTER — Telehealth: Payer: Self-pay

## 2021-03-28 ENCOUNTER — Other Ambulatory Visit (HOSPITAL_COMMUNITY): Payer: Commercial Managed Care - HMO

## 2021-03-28 NOTE — Telephone Encounter (Signed)
The patient called to confirm blood work this week. Confirmed appointment 2/23 and she understands to come any time between 0730 and 1630 and that she doesn't need to be fasting.  She states she feels better after transfusion and is not as tired. She slept well this weekend as well. Reiterated to her not to restart her Eliquis at this time and to wait until she is called with results of blood work. She was grateful for call and agrees with plan.

## 2021-03-29 ENCOUNTER — Other Ambulatory Visit (HOSPITAL_COMMUNITY): Payer: Commercial Managed Care - HMO

## 2021-03-30 ENCOUNTER — Ambulatory Visit: Payer: Commercial Managed Care - HMO | Admitting: *Deleted

## 2021-03-30 DIAGNOSIS — I5032 Chronic diastolic (congestive) heart failure: Secondary | ICD-10-CM

## 2021-03-30 DIAGNOSIS — I480 Paroxysmal atrial fibrillation: Secondary | ICD-10-CM

## 2021-03-30 DIAGNOSIS — E1165 Type 2 diabetes mellitus with hyperglycemia: Secondary | ICD-10-CM

## 2021-03-30 NOTE — Chronic Care Management (AMB) (Signed)
Chronic Care Management   CCM RN Visit Note  03/30/2021 Name: Mary Mccarthy MRN: [id number] DOB: 07/31/1940  Subjective: Mary Mccarthy is a 81 y.o. year old female who is a primary care patient of Biagio Borg, MD. The care management team was consulted for assistance with disease management and care coordination needs.    Engaged with patient by telephone for follow up visit in response to provider referral for case management and/or care coordination services.   Consent to Services:  The patient was given information about Chronic Care Management services, agreed to services, and gave verbal consent prior to initiation of services.  Please see initial visit note for detailed documentation.  Patient agreed to services and verbal consent obtained.   Assessment: Review of patient past medical history, allergies, medications, health status, including review of consultants reports, laboratory and other test data, was performed as part of comprehensive evaluation and provision of chronic care management services.  CCM Care Plan  Allergies  Allergen Reactions   Ibuprofen Other (See Comments)    Bleeding events   Outpatient Encounter Medications as of 03/30/2021  Medication Sig   acetaminophen (TYLENOL) 500 MG tablet Take 500 mg by mouth every 6 (six) hours as needed for moderate pain.   ALPRAZolam (XANAX) 0.25 MG tablet TAKE 1 TABLET BY MOUTH THREE TIMES A DAY AS NEEDED FOR ANXIETY   amiodarone (PACERONE) 200 MG tablet Take 1 tablet (200 mg total) by mouth daily.   amLODipine (NORVASC) 10 MG tablet Take 1 tablet (10 mg total) by mouth daily.   apixaban (ELIQUIS) 2.5 MG TABS tablet Take 1 tablet (2.5 mg total) by mouth 2 (two) times daily. (Patient not taking: Reported on 03/24/2021)   atorvastatin (LIPITOR) 40 MG tablet TAKE 1 TABLET BY MOUTH EVERY DAY   blood glucose meter kit and supplies Dispense based on patient and insurance preference. Use up to four times daily as directed. E11.9    Blood Glucose Monitoring Suppl (ONE TOUCH ULTRA 2) w/Device KIT Use as directed daily   citalopram (CELEXA) 10 MG tablet Take 1 tablet (10 mg total) by mouth daily.   cyanocobalamin ([facility] VITAMIN B12) 1000 MCG tablet TAKE 1 TABLET BY MOUTH EVERY DAY   furosemide (LASIX) 40 MG tablet Take 1 tablet (40 mg total) by mouth daily.   Lancets (ONETOUCH DELICA PLUS [id number]) MISC USE AS DIRECTED TEST 1-2 TIMES A DAY   losartan (COZAAR) 50 MG tablet Take 0.5 tablets (25 mg total) by mouth daily.   metFORMIN (GLUCOPHAGE) 1000 MG tablet 1/2 tab by mouth twice per day   metoprolol succinate (TOPROL-XL) 25 MG 24 hr tablet Take 0.5 tablets (12.5 mg total) by mouth daily.   mirtazapine (REMERON) 15 MG tablet Take 15 mg by mouth at bedtime.   ONETOUCH ULTRA test strip USE AS INSTRUCTED TEST 1-2 TIMES A DAY   pantoprazole (PROTONIX) 40 MG tablet TAKE 1 TABLET BY MOUTH TWICE A DAY   potassium chloride SA (KLOR-CON) 20 MEQ tablet Take 1 tablet (20 mEq total) by mouth daily.   No facility-administered encounter medications on file as of 03/30/2021.   Patient Active Problem List   Diagnosis Date Noted   Encounter for well adult exam with abnormal findings 02/16/2021   Long term current use of amiodarone for rate-rhythm control of A. fib 11/28/2020   Chronic respiratory failure ([place]) 11/22/2020   Dehydration 11/02/2020   Diabetes mellitus type 2 in obese ([place]) 11/02/2020   AKI (acute kidney injury) ([place]) 11/01/2020  Transient post TAVR CHB -> not seen on follow-up Zio patch 10/28/2020   S/P TAVR (transcatheter aortic valve replacement) 10/26/2020   Melena 10/20/2020   Symptomatic anemia 10/19/2020   Acute bronchitis 09/21/2020   Current use of long term anticoagulation - for Afib; CHA2DS2-VASc 9, on Eliquis 08/23/2020   PAF with RVR: CHA2DS2-VASc 9.  On Eliquis and amiodarone. 08/11/2020   History of CVA (cerebrovascular accident) 08/10/2020   Hypomagnesemia 08/10/2020   Severe aortic stenosis 08/2020    Aortic atherosclerosis ([place]) 03/18/2020   B12 deficiency 09/21/2019   Cough 03/14/2018   Wheezing 03/14/2018   Anxiety 09/11/2017   Osteoporosis 03/10/2016   Coronary artery disease, non-occlusive 05/24/2015   GI bleed    Pulmonary hypertension (HCC)    CHF (congestive heart failure), NYHA class II, chronic, diastolic ([place]) 03/88/8280   Gastric ulceration    AVM (arteriovenous malformation) of colon    Demand ischemia of myocardium (HCC)    Right knee pain 05/21/2014   Anemia, iron deficiency 05/20/2013   MENOPAUSAL DISORDER 12/07/2009   INSOMNIA 09/24/2008   ALLERGIC RHINITIS 06/24/2007   GERD 06/24/2007   COLONIC POLYPS, HX OF 06/24/2007   Hyperlipidemia with target LDL less than 70 10/08/2006   Anxiety with depression 10/08/2006   Essential hypertension 10/08/2006   Peptic ulcer 10/08/2006   Type 2 diabetes mellitus with hyperglycemia, without long-term current use of insulin ([place]) 10/05/2006   Morbid obesity ([place]) 10/05/2006   Conditions to be addressed/monitored:  Atrial Fibrillation, CHF, and DMII  Care Plan : RN Care Manager Plan of Care  Updates made by Knox Royalty, RN since 03/30/2021 12:00 AM     Problem: Chronic Disease Management Needs   Priority: High     Long-Range Goal: Development of plan of care for long term chronic disease management   Start Date: 11/17/2020  Expected End Date: 11/17/2021  Priority: High  Note:   Current Barriers:  Chronic Disease Management support and education needs related to CHF and DMII Lives with son- who works during daytime hours: alone during day, limited stamina to perform ADL's/ iADL's after multiple recent hospitalizations Fragile state of health, multiple progressing chronic health conditions Confusion around multiple new medications, post- recent hospitalizations- 12/06/20- confirmed patient has spoken with CCM Pharmacy team; 02/21/21- confirmed CCM pharmacy team active 12/06/20: Patient has received home oxygen,  however, she reports not using: states oxygenator is "too loud" and keeps her awake: 01/19/21: Now resolved 12/20/20-- reports unable to get through to [place]: care coordination outreach placed on patient's behalf 01/19/21: reports [employer] came out to her home after last care coordination outreach and replaced the oxygenator- reports "it is much better and much more quiet; I am using it occasionally" 03/30/21: patient reports "rarely" uses home O2; she is considering having this returned to company Multiple recent inpatient hospitalizations: 03/30/21: confirmed no new/ recent hospital or ED visits since last discharge 11/04/20 August 22-26, 2022: CHF September 12-22, 2022: AF with RVR; CHF; TAVR September 26-29, 2022: N/V, AKI  RNCM Clinical Goal(s):  Patient will verbalize basic understanding of CHF and DMII disease process and self health management plan CHF; A-Fib; DMII  through collaboration with RN Care manager, provider, and care team.   Interventions: 1:1 collaboration with primary care provider regarding development and update of comprehensive plan of care as evidenced by provider attestation and co-signature Inter-disciplinary care team collaboration (see longitudinal plan of care) Evaluation of current treatment plan related to  self management and patient's adherence to plan as  established by provider 03/30/21: Pain assessment updated: denies acute/ chronic pain Falls assessment updated: denies new/ recent falls, continues using cane as needed/ indicated; previously provided education around fall risks/ prevention reinforced; positive reinforcement provided with encouragement to continue efforts to stay fall-free Confirmed patient will be having upcoming procedure April 14, 2021 for watchman's-- she reports "being very nervous" about this procedure, but has no specific questions; confirms she has talked to her cardiology providers and is going through with procedure, despite being "jittery"  about it; she confirms she received the printed educational information I previously sent her  Heart Failure Interventions:  (Status: 03/30/21: Goal on Track (progressing): YES.) Long term Goal Discussed importance of daily weight and advised patient to weigh and record daily Reviewed role of diuretics in prevention of fluid overload and management of heart failure Discussed the importance of keeping all appointments with provider Reviewed recent weights at home: reports consistently between 188-190 lbs, with weight this morning of "190 lbs;" denies weight gain > 3 lbs overnight/ 5 lbs in one week Reinforced previously provided education monitoring daily weights/ breathing status at home, and to follow action plan for yellow CHF zone: patient continues to require ongoing reinforcement/ support Discussed use of home oxygen at home: again reports not using, reports "breathing is better overall;" states she uses "maybe" once a week for short periods of time, but also states she hopes that she will no longer need home O2 after her upcoming cardiac procedure, and verbalizes plans to send back to company, if possible Reviewed recent cardiology provider office visits in mid-February; she verbalizes understanding to continue remaining OFF of Eliquis-- and confirms she has not been taking since she was advised to stop in mid-February due to anemia requiring blood transfusion  Confirms no recent signs/ symptoms A-Fib-- signs/ symptoms along with corresponding action plan reinforced-- she will require ongoing support/ reinforcement Reviewed upcoming provider office visit with patient: 04/12/21- lab work/ screening pre- watchman's procedure; 04/20/21- PCP office visit; 04/21/21- CCM pharmacy team; 05/02/21- cardiology/ primary/ Dr. Ellyn Hack: patient verbalizes awareness of all appointments and plans to attend as scheduled  Diabetes:  (Status: 03/30/21: Goal on Track (progressing): YES.) Long term Goal Lab Results   Component Value Date   HGBA1C 7.5 (H) 02/16/2021  Provided education to patient about basic DM disease process; Discussed plans with patient for ongoing care management follow up and provided patient with direct contact information for care management team;      Review of patient status, including review of consultants reports, relevant laboratory and other test results, and medications completed;       Confirmed patient continues monitoring/ recording blood sugars at home BID: fasting and post-prandial Reviewed with patient recent blood sugars at home: today reports general ranges between 140-190, both fasting and post-prandial; denies recent low blood sugars; denies recent medication changes to DM meds; confirms taking metformin 500 mg BID Reinforced previously provided education on dietary strategies for low salt/ heart healthy; diabetic/ low sugar/ carb diet: reports appetite "comes and goes" again around anxiety around upcoming procedure; confirms she is taking prn xanax "every couple of days" and confirms it helps decrease her anxiety Patient admits that her priority now is getting upcoming watchman's device and states she will try to put more effort in to managing DMII after the procedure, "if everything goes well"  Patient Goals/Self-Care Activities: As evidenced by review of EHR, collaboration with care team, and patient reporting during CCM RN CM outreach,  Patient [place] will: Take medications  as prescribed   Attend all scheduled provider appointments  Call pharmacy for medication refills  Call provider office for new concerns or questions  Continue to follow heart healthy, low salt, carbohydrate modified, low sugar diet Continue to monitor and record fasting and afternoon blood sugars at home daily Continue to monitor and record daily weights at home: you reported a weight this morning of 190 lbs Use home O2 as prescribed-- as needed for episodes of shortness of breath Keep up  the great work preventing falls-- continue using your cane and walker as needed Do your best to eat a heart healthy diet that is low in salt, cholesterol, sugar and carbohydrates Make a list of your questions about the Watchman's procedure to discuss with your cardiology team Stay in touch with the pharmacist at Dr. Gwynn Burly office to make sure your medications are regularly updated and optimized    Plan: Telephone follow up appointment with care management team member scheduled for:  Tuesday, May 10, 2021 at 3:00 pm The patient has been provided with contact information for the care management team and has been advised to call with any health related questions or concerns  [name], RN, BSN, [place] [phone]: direct office

## 2021-03-30 NOTE — Patient Instructions (Signed)
Visit Information  [name], thank you for taking time to talk with me today. Please don't hesitate to contact me if I can be of assistance to you before our next scheduled telephone appointment.  Below are the goals we discussed today:  Patient Self-Care Activities: Patient Mary Mccarthy will: Take medications as prescribed   Attend all scheduled provider appointments  Call pharmacy for medication refills  Call provider office for new concerns or questions  Continue to follow heart healthy, low salt, carbohydrate modified, low sugar diet Continue to monitor and record fasting and afternoon blood sugars at home daily Continue to monitor and record daily weights at home: you reported a weight this morning of 190 lbs Use home O2 as prescribed-- as needed for episodes of shortness of breath Keep up the great work preventing falls-- continue using your cane and walker as needed Do your best to eat a heart healthy diet that is low in salt, cholesterol, sugar and carbohydrates Make a list of your questions about the Watchman's procedure to discuss with your cardiology team Stay in touch with the pharmacist at Dr. Gwynn Burly office to make sure your medications are regularly updated and optimized  Our next scheduled telephone follow up visit/ appointment with care management team member is scheduled on:   Tuesday, May 10, 2021 at 3:00 pm- This is a PHONE [place] appointment  If you need to cancel or re-schedule our visit, please call [phone] and our care guide team will be happy to assist you.   I look forward to hearing about your progress.   [name], RN, BSN, [place] [phone]: direct office  If you are experiencing a Mental Health or [place] or need someone to talk to, please  call the Suicide and Crisis Lifeline: 988 call the [place] National Suicide Prevention Lifeline: (781)339-8040 or TTY: 765-610-9491  TTY 810 296 8243) to talk to a trained counselor call 1-800-273-TALK (toll free, 24 hour hotline) go to [facility] [address] [place] [phone]) call 911   The patient verbalized understanding of instructions, educational materials, and care plan provided today and agreed to receive a mailed copy of patient instructions, educational materials, and care plan  Left Atrial Appendage Closure Device Implantation, Care After The following information offers guidance on how to care for yourself after your procedure. Your health care provider may also give you more specific instructions. If you have problems or questions, contact your health care provider. What can I expect after the procedure? After the procedure, it is common to have: Some pain, swelling, soreness, and bruising around the site where a long, thin tube (catheter) was inserted. Tiredness (fatigue). Follow these instructions at home: Medicines Take over-the-counter and prescription medicines only as told by your health care provider. If you were prescribed antibiotic medicine, take it as told by your health care provider. Do not stop taking the antibiotic even if you start to feel better. You may need to take medicines to prevent blood clots. Catheter insertion area care  Follow instructions from your health care provider about how to take care of the catheter insertion site. Make sure you: Wash your hands with soap and water for at least 20 seconds before and after you change your bandage (dressing). If soap and water are not available, use hand sanitizer. Change your dressing as told by your health care provider. Leave stitches (sutures), skin glue, or adhesive strips in place. These skin  closures may need to stay in place for 2 weeks or longer. If adhesive strip edges start to loosen and curl up, you may trim the loose edges. Do not remove adhesive strips completely unless your  health care provider tells you to do that. Skin glue and adhesive strips usually fall off on their own. Check your catheter insertion area every day for signs of infection. Check for: More redness, swelling, or pain. Fluid or blood. Warmth. Pus or a bad smell. A lump or bump that may develop. Activity  Do not lift anything that is heavier than 5 lb (2.3 kg), or the limit that you are told, until your health care provider says that it is safe. Avoid activities that take a lot of effort (are strenuous). Ask your health care provider what activities are safe for you. Avoid sexual activity until your health care provider says that it is safe. Do not drive for the time you are told if you were given a medicine to help you relax (sedative) during the procedure. Ask your health care provider when it is safe for you to drive. Lifestyle Do not use any products that contain nicotine or tobacco. These include cigarettes, chewing tobacco, and vaping devices, such as e-cigarettes. If you need help quitting, ask your health care provider. If you drink alcohol: . Limit how much you use to: 0-1 drink a day for women. 0-2 drinks a day for men. Know how much alcohol is in your drink. In the [place] one drink equals one 12 oz bottle of beer (355 mL), one 5 oz glass of wine (148 mL), or one 1 oz glass of hard liquor (44 mL). General instructions Do not take baths, swim, shower, or use a hot tub until your health care provider approves. Follow instructions from your health care provider about eating or drinking restrictions. You may need to follow a diet that is low in salt (sodium) and low in fat to prevent heart problems. Keep all follow-up visits. This is important. Contact a health care provider if: You have severe pain that does not get better with medicine. You have more redness, swelling, or pain around your catheter insertion site. You have fluid or blood coming from your catheter insertion area. Your  catheter insertion site feels warm to the touch. You have pus or a bad smell coming from your catheter insertion area. You have a fever. You have nausea and vomiting. You develop a lump or bump in your catheter insertion area. Get help right away if:  The catheter insertion area is bleeding, and the bleeding does not stop when you put steady pressure on the area. You have chest pain. You have trouble breathing. You have dizziness. You faint. You have any symptoms of a stroke. "BE FAST" is an easy way to remember the main warning signs of a stroke: B - Balance. Signs are dizziness, sudden trouble walking, or loss of balance. E - Eyes. Signs are trouble seeing or a sudden change in vision. F - Face. Signs are sudden weakness or numbness of the face, or the face or eyelid drooping on one side. A - Arms. Signs are weakness or numbness in an arm. This happens suddenly and usually on one side of the body. S - Speech. Signs are sudden trouble speaking, slurred speech, or trouble understanding what people say. T - Time. Time to call emergency services. Write down what time symptoms started. You have other signs of a stroke, such as: A sudden, severe  headache with no known cause. Nausea or vomiting. Seizure. These symptoms may represent a serious problem that is an emergency. Do not wait to see if the symptoms will go away. Get medical help right away. Call your local emergency services (911 in the [place] Do not drive yourself to the hospital. Summary It is common to have some pain and soreness after this procedure. Check your catheter insertion area every day for signs of infection, such as more redness, swelling, or pain. Do not take baths, swim, shower, or use a hot tub until your health care provider approves. This information is not intended to replace advice given to you by your health care provider. Make sure you discuss any questions you have with your health care provider. Document  Revised: 10/02/2019 Document Reviewed: 10/02/2019 Elsevier Patient Education  [place].

## 2021-03-31 ENCOUNTER — Other Ambulatory Visit: Payer: Medicare Other

## 2021-03-31 ENCOUNTER — Other Ambulatory Visit: Payer: Self-pay

## 2021-03-31 DIAGNOSIS — N179 Acute kidney failure, unspecified: Secondary | ICD-10-CM | POA: Diagnosis not present

## 2021-03-31 DIAGNOSIS — D500 Iron deficiency anemia secondary to blood loss (chronic): Secondary | ICD-10-CM | POA: Diagnosis not present

## 2021-03-31 LAB — CBC
Hematocrit: 25.8 % — ABNORMAL LOW (ref 34.0–46.6)
Hemoglobin: 7.6 g/dL — ABNORMAL LOW (ref 11.1–15.9)
MCH: 24 pg — ABNORMAL LOW (ref 26.6–33.0)
MCHC: 29.5 g/dL — ABNORMAL LOW (ref 31.5–35.7)
MCV: 81 fL (ref 79–97)
Platelets: 252 10*3/uL (ref 150–450)
RBC: 3.17 x10E6/uL — ABNORMAL LOW (ref 3.77–5.28)
RDW: 17.3 % — ABNORMAL HIGH (ref 11.7–15.4)
WBC: 5 10*3/uL (ref 3.4–10.8)

## 2021-03-31 LAB — BASIC METABOLIC PANEL
BUN/Creatinine Ratio: 15 (ref 12–28)
BUN: 24 mg/dL (ref 8–27)
CO2: 24 mmol/L (ref 20–29)
Calcium: 9.5 mg/dL (ref 8.7–10.3)
Chloride: 104 mmol/L (ref 96–106)
Creatinine, Ser: 1.64 mg/dL — ABNORMAL HIGH (ref 0.57–1.00)
Glucose: 127 mg/dL — ABNORMAL HIGH (ref 70–99)
Potassium: 5.2 mmol/L (ref 3.5–5.2)
Sodium: 139 mmol/L (ref 134–144)
eGFR: 31 mL/min/{1.73_m2} — ABNORMAL LOW (ref >59)

## 2021-04-01 ENCOUNTER — Other Ambulatory Visit: Payer: Self-pay | Admitting: Physician Assistant

## 2021-04-01 DIAGNOSIS — D500 Iron deficiency anemia secondary to blood loss (chronic): Secondary | ICD-10-CM

## 2021-04-01 DIAGNOSIS — N179 Acute kidney failure, unspecified: Secondary | ICD-10-CM

## 2021-04-02 ENCOUNTER — Other Ambulatory Visit: Payer: Self-pay | Admitting: Internal Medicine

## 2021-04-05 DIAGNOSIS — I480 Paroxysmal atrial fibrillation: Secondary | ICD-10-CM | POA: Diagnosis not present

## 2021-04-05 DIAGNOSIS — E1165 Type 2 diabetes mellitus with hyperglycemia: Secondary | ICD-10-CM | POA: Diagnosis not present

## 2021-04-05 DIAGNOSIS — I5032 Chronic diastolic (congestive) heart failure: Secondary | ICD-10-CM

## 2021-04-05 DIAGNOSIS — E785 Hyperlipidemia, unspecified: Secondary | ICD-10-CM

## 2021-04-07 ENCOUNTER — Other Ambulatory Visit: Payer: Self-pay

## 2021-04-07 ENCOUNTER — Encounter (INDEPENDENT_AMBULATORY_CARE_PROVIDER_SITE_OTHER): Payer: Self-pay

## 2021-04-07 ENCOUNTER — Other Ambulatory Visit: Payer: Medicare Other

## 2021-04-07 DIAGNOSIS — D500 Iron deficiency anemia secondary to blood loss (chronic): Secondary | ICD-10-CM | POA: Diagnosis not present

## 2021-04-07 DIAGNOSIS — N179 Acute kidney failure, unspecified: Secondary | ICD-10-CM | POA: Diagnosis not present

## 2021-04-07 LAB — BASIC METABOLIC PANEL
BUN/Creatinine Ratio: 14 (ref 12–28)
BUN: 21 mg/dL (ref 8–27)
CO2: 27 mmol/L (ref 20–29)
Calcium: 10.2 mg/dL (ref 8.7–10.3)
Chloride: 103 mmol/L (ref 96–106)
Creatinine, Ser: 1.55 mg/dL — ABNORMAL HIGH (ref 0.57–1.00)
Glucose: 135 mg/dL — ABNORMAL HIGH (ref 70–99)
Potassium: 5 mmol/L (ref 3.5–5.2)
Sodium: 137 mmol/L (ref 134–144)
eGFR: 33 mL/min/{1.73_m2} — ABNORMAL LOW (ref >59)

## 2021-04-07 LAB — HEMOGLOBIN AND HEMATOCRIT, BLOOD
Hematocrit: 25.2 % — ABNORMAL LOW (ref 34.0–46.6)
Hemoglobin: 7.8 g/dL — ABNORMAL LOW (ref 11.1–15.9)

## 2021-04-08 ENCOUNTER — Encounter (INDEPENDENT_AMBULATORY_CARE_PROVIDER_SITE_OTHER): Payer: Self-pay

## 2021-04-08 ENCOUNTER — Ambulatory Visit: Payer: Medicare Other | Admitting: Cardiovascular Disease

## 2021-04-08 ENCOUNTER — Encounter: Payer: Self-pay | Admitting: Cardiovascular Disease

## 2021-04-08 VITALS — BP 146/60 | HR 56 | Ht 61.0 in | Wt 190.6 lb

## 2021-04-08 DIAGNOSIS — I480 Paroxysmal atrial fibrillation: Secondary | ICD-10-CM | POA: Diagnosis not present

## 2021-04-08 NOTE — Patient Instructions (Signed)
Medication Instructions:  ?Your physician recommends that you continue on your current medications as directed. Please refer to the Current Medication list given to you today. ? ?*If you need a refill on your cardiac medications before your next appointment, please call your pharmacy* ? ? ?Lab Work: ?None ? ?If you have labs (blood work) drawn today and your tests are completely normal, you will receive your results only by: ?MyChart Message (if you have MyChart) OR ?A paper copy in the mail ?If you have any lab test that is abnormal or we need to change your treatment, we will call you to review the results. ? ? ?Testing/Procedures: ?None ? ?Follow-Up: ?AS NEEDED1}  ? ? ?Other Instructions ? ?  ?

## 2021-04-08 NOTE — Progress Notes (Signed)
Cardiology Office Note:    Date:  04/12/2021   ID:  Mary Mccarthy, DOB 1940/12/30, MRN [id number]  PCP:  Biagio Borg, MD   [facility] Cardiologist:  Glenetta Hew, MD     Referring MD: Biagio Borg, MD   Chief Complaint  Patient presents with   Atrial Fibrillation    History of Present Illness:    Mary Mccarthy is a 81 y.o. female with a hx of obesity, DMT2, non obstructive CAD, PUD/AVM with recurrent GI bleeding, iron deficiency anemia, PAF on amio and Eliquis (initially diagnosed at the time of acute CVA in 08/2020), HTN, pulmonary HTN, chronic diastolic CHF, and severe AS s/p TAVR (10/27/20) who presents for follow-up evaluation for LAAO implant with Watchman.  The patient has had problems with recurrent anemia.  She has a complex cardiac history as outlined above, undergoing TAVR in September 2022.  She was found to have symptomatic iron deficiency anemia with hemoglobin of approximately 7 mg/dL requiring packed red blood cell transfusion.  Upper endoscopy showed gastritis with no signs of active bleeding but there were some stigmata of recent bleeding.  It was presumed that gastric erosions in the setting of anticoagulation led to her gastrointestinal bleeding and anemia.  Colonoscopy was deferred.  After initially undergoing consultation for watchman implantation, her cardiac CTA was reviewed and she is felt to be a candidate for implantation of a watchman flex device.  However, she has continued to have problems with symptomatic anemia even on a low dose of apixaban.  She recently required another packed red blood cell transfusion and her hemoglobin increased from 6.8-7.8 with that.  Apixaban is again on hold.  The patient continues to complain of fatigue and exertional dyspnea, both of which are stable. She has had no recent chest pain. No orthopnea, PND, or edema. No melena or hematochezia.   Past Medical History:  Diagnosis Date   ALLERGIC RHINITIS 06/24/2007    Anemia    AVM (arteriovenous malformation) of colon    Blood transfusion without reported diagnosis 05/2015   COLONIC POLYPS, HX OF 06/24/2007   Coronary artery disease, non-occlusive 05/2015   Trop + w/ Acute Anemia =>CATH: small RI - Ostial 60%, ostial RCA 30% and dLAD 40-50%;; 9/'21: Cor Ca Score 624. Mild (25-49%) prox RCA & LAD,; Moderate (50-69%) Ostial Small RI & prox LCx.     DEPRESSION 10/08/2006   DIABETES MELLITUS, TYPE II 10/05/2006   GERD 06/24/2007   History of CVA (cerebrovascular accident) 08/10/2020   08/2020- found to be in afib with RVR, started on Eliquis (shower of emboli from A. fib as complication of severe AS)   HYPERLIPIDEMIA 10/08/2006   HYPERTENSION 10/08/2006   INSOMNIA 09/24/2008   Left knee DJD    Nonsustained paroxysmal ventricular tachycardia 10/2018   Post-TAVR Zio Patch: Frequent (206) runs of NSVT - Fastest 5 beats 239 bpm. Longest 9.3 Sec avg 135 bpm. (? possibly Afib w/ aberrancy)   Osteoporosis 03/10/2016   PAF (paroxysmal atrial fibrillation) (HCC)    PAF with RVR: CHA2DS2-VASc 9.  On Eliquis and amiodarone. 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 Angelena Form & Dr. Cyndia Bent   Severe aortic stenosis 08/2020   Progression from Mod-Severe AS to Severe AS - noted on Echo 08/2020 -> (mean gradient progressed from 34 to 42 mmHg) this was in setting  of new onset A. fib RVR, acute diastolic HF and shower of emboli CVA. ->  Referred for TAVR, completed 10/26/2020    Past Surgical History:  Procedure Laterality Date   APPENDECTOMY     BIOPSY  10/21/2020   Procedure: BIOPSY;  Surgeon: Sharyn Creamer, MD;  Location: [facility] ENDOSCOPY;  Service: Gastroenterology;;   BREAST BIOPSY     CARDIAC CATHETERIZATION N/A 05/21/2015   Procedure: Left Heart Cath and Coronary Angiography;  Surgeon: Leonie Man, MD;  Location: [place] [facility]  LAB;  Service: Cardiovascular;: Ost RI 60%, Ost RCA 30%, dLAD tapers to small vessel w/ 40-50%. Mildly elevated LVEDP. Normal LV Fxn.   COLONOSCOPY N/A 05/20/2015   Procedure: COLONOSCOPY;  Surgeon: Manus Gunning, MD;  Location: [place];  Service: Gastroenterology;  Laterality: N/A;   CORONARY CA2+ SCORE / CARDIAC CT ANGIOGRAM  10/09/2019   Calcium score 624.  82nd percentile. Dominant RCA: Mild (25-49%) proximal stenosis-distal bifurcation into PDA and PAV--< RPL branches.  LAD (1 major mid vessel diagonal) diffuse calcified plaque, mild proximal stenosis with minimal distal stenosis.  Small RI moderate ostial disease.  LCx-moderate mixed (50-69%) proximal stenosis.  Small dOM1 disease.  Trileaflet AoV, annular Ca2+ - probable AS   ESOPHAGOGASTRODUODENOSCOPY N/A 05/20/2015   Procedure: ESOPHAGOGASTRODUODENOSCOPY (EGD);  Surgeon: Manus Gunning, MD;  Location: [place];  Service: Gastroenterology;  Laterality: N/A;   ESOPHAGOGASTRODUODENOSCOPY (EGD) WITH PROPOFOL N/A 10/21/2020   Procedure: ESOPHAGOGASTRODUODENOSCOPY (EGD) WITH PROPOFOL;  Surgeon: Sharyn Creamer, MD;  Location: [place];  Service: Gastroenterology;  Laterality: N/A;   INTRAOPERATIVE TRANSTHORACIC ECHOCARDIOGRAM N/A 10/26/2020   Procedure: INTRAOPERATIVE TRANSTHORACIC ECHOCARDIOGRAM;  Surgeon: Angelena Form; Location: [facility] OR; Pre-TAVR well-visualized calcified AoV mean Grad 37 mmHg, AVA 0.72 cm => Post TAVR well-positioned supra-annular 26 mm Medtronic Evolut Pro valve placed with no PVL.  Mean gradient 14 mmHg.  AVR 1.58 cm.  Normal flow to RCA and LM-LAD.  ; EF 60 to 65%.  Degenerative severe MAC w/ mild MR.   RIGHT/LEFT HEART CATH AND CORONARY ANGIOGRAPHY N/A 09/29/2020   Procedure: RIGHT/LEFT HEART CATH AND CORONARY ANGIOGRAPHY;  Surgeon: Burnell Blanks, MD;  Location: [place] [facility];  Service: Cardiovascular; pre-TAVR:  pRCA 20%, m-dRCA 30%. D LM-pLAD 20%. RI 30%, mLAD 20%.  Severe AS with mean  gradient measured at 52.8 mmHg.   TONSILLECTOMY     TRANSCATHETER AORTIC VALVE REPLACEMENT, TRANSFEMORAL N/A 10/26/2020   Procedure: TRANSCATHETER AORTIC VALVE REPLACEMENT, TRANSFEMORAL;  Surgeon: Burnell Blanks, MD;  Location: [facility] OR;  Service: Open Heart Surgery;; Medtronic Evolut-Pro + (size 26 mm, model # EVPROPLUS -26US, serial # [id number]); transient high-grade AV block noted so PPM left in place.   TRANSTHORACIC ECHOCARDIOGRAM  03/17/2019   a) 03/2019: EF 60 to 65%.  Moderate LVH.  GRII DD.  Mod-Severe AS (m grad 36 mmHg, peak 59 mmHg); b) 09/2019: EF 65-70%, No RWMA. Gr1 DD/hi LAP, Mild hi PAP. Mod LA Dil. MOD AS (mean Grad 34.5 mmHg).  STABLE   TRANSTHORACIC ECHOCARDIOGRAM  08/27/2020   Admitted for CVA/Afib RVR & CHF (Progression to SEVERE SYMPTOMATIC AS):  Severe Calcific Aortic Stenosis (VTI AVA estimated 0.86 cm, mean gradient 42 mmHg, V-max 4.36 m/s).  EF 55 to 60%.  Severe concentric LVH.  Unable to determine diastolic parameters.  Moderately elevated PAP.  Mild LA dilation.  Mild circumferential pericardial effusion.  Trivial MR.  Severe MAC.   TRANSTHORACIC ECHOCARDIOGRAM  11/25/2020   1 Month s/p TAVR: EF 55 to 60%.  No R WMA.  GR 1 DD.  Elevated LVEDP.  Normal RV size with mildly elevated RVP estimated 44 mmHg.  Oscillating density in the RV suspect calcified chordal apparatus versus calcified thrombus.  Moderate LA dilation.  Normal IVC/RA P => Well-positioned 26 mm Medtronic Evolut Pro THVwith mean AOV gradient 11 mmHg.  Trivial PVL   TRANSTHORACIC ECHOCARDIOGRAM  11/02/2020   a) Day 1 Post-op TAVR 10/27/20:  Well-Positioned Supra Annular Medtronic Evolut Pro THP.  Mean gradient 12 mmHg, peak 24 mmHg.  AVA 1.8 cm.  Trivial PVL.  EF 60 to 65%.  Normal LV function.  Mild LVH.  Severe LA dilation.  Mild RA dilation.  Severe MAC;; b) 11/02/20: Normal structure and function of the aortic valve prosthesis.  Mean gradient 9 mmHg (otherwise stable)   TUBAL LIGATION      Current  Medications: Current Meds  Medication Sig   acetaminophen (TYLENOL) 500 MG tablet Take 500 mg by mouth every 6 (six) hours as needed for moderate pain.   ALPRAZolam (XANAX) 0.25 MG tablet TAKE 1 TABLET BY MOUTH THREE TIMES A DAY AS NEEDED FOR ANXIETY   amiodarone (PACERONE) 200 MG tablet Take 1 tablet (200 mg total) by mouth daily.   amLODipine (NORVASC) 10 MG tablet Take 1 tablet (10 mg total) by mouth daily.   atorvastatin (LIPITOR) 40 MG tablet TAKE 1 TABLET BY MOUTH EVERY DAY   blood glucose meter kit and supplies Dispense based on patient and insurance preference. Use up to four times daily as directed. E11.9   Blood Glucose Monitoring Suppl (ONE TOUCH ULTRA 2) w/Device KIT Use as directed daily   citalopram (CELEXA) 10 MG tablet Take 1 tablet (10 mg total) by mouth daily.   cyanocobalamin ([employer] VITAMIN B12) 1000 MCG tablet TAKE 1 TABLET BY MOUTH EVERY DAY   furosemide (LASIX) 40 MG tablet Take 1 tablet (40 mg total) by mouth daily.   Lancets (ONETOUCH DELICA PLUS [id number]) MISC USE AS DIRECTED TEST 1-2 TIMES A DAY   losartan (COZAAR) 50 MG tablet Take 0.5 tablets (25 mg total) by mouth daily.   metFORMIN (GLUCOPHAGE) 1000 MG tablet 1/2 tab by mouth twice per day   metoprolol succinate (TOPROL-XL) 25 MG 24 hr tablet Take 0.5 tablets (12.5 mg total) by mouth daily.   mirtazapine (REMERON) 15 MG tablet Take 15 mg by mouth at bedtime.   ONETOUCH ULTRA test strip USE AS INSTRUCTED TEST 1-2 TIMES A DAY   pantoprazole (PROTONIX) 40 MG tablet TAKE 1 TABLET BY MOUTH TWICE A DAY   [DISCONTINUED] potassium chloride SA (KLOR-CON) 20 MEQ tablet Take 1 tablet (20 mEq total) by mouth daily.     Allergies:   Ibuprofen   Social History   Socioeconomic History   Marital status: Widowed    Spouse name: Not on file   Number of children: 1   Years of education: Not on file   Highest education level: Not on file  Occupational History   Occupation: retired - Social research officer, government:  RETIRED  Tobacco Use   Smoking status: Never   Smokeless tobacco: Never  Vaping Use   Vaping Use: Never used  Substance and Sexual Activity   Alcohol use: No    Alcohol/week: 0.0 standard drinks   Drug use: No   Sexual activity: Not on file  Other Topics Concern   Not on file  Social History Narrative   Recently widowed--husband died in November 18, 2018.  Now lives alone but her son usually stays with her at night.  During the day she has 2 nephews who live nearby to come in and check on her intermittently.  Also one of her nieces calls routinely.      When her son is home and awake, she may try to walk on a treadmill, but is scared to walk outside.   Social Determinants of Health   Financial Resource Strain: Medium Risk   Difficulty of Paying Living Expenses: Somewhat hard  Food Insecurity: No Food Insecurity   Worried About Charity fundraiser in the Last Year: Never true   Ran Out of Food in the Last Year: Never true  Transportation Needs: No Transportation Needs   Lack of Transportation (Medical): No   Lack of Transportation (Non-Medical): No  Physical Activity: Not on file  Stress: Stress Concern Present   Feeling of Stress : Rather much  Social Connections: Not on file     Family History: The patient's family history includes Alcohol abuse in an other family member; Arrhythmia in her sister; Arthritis in an other family member; CAD in her sister; Cancer in her mother; Depression in an other family member; Diabetes in an other family member; Heart attack (age of onset: 79) in her sister; Heart disease in an other family member; Hyperlipidemia in an other family member; Hypertension in an other family member; Stroke in an other family member.  ROS:   Please see the history of present illness.    All other systems reviewed and are negative.  EKGs/Labs/Other Studies Reviewed:    The following studies were reviewed today: Echo 11-25-2020:  1. Left ventricular ejection  fraction, by estimation, is 55 to 60%. The  left ventricle has normal function. The left ventricle has no regional  wall motion abnormalities. There is mild left ventricular hypertrophy.  Left ventricular diastolic parameters  are consistent with Grade I diastolic dysfunction (impaired relaxation).  Elevated left ventricular end-diastolic pressure. The E/e' is 20.   2. Oscillating density seen in the RV, inferior to the TV (images 45-46),  suspect calcified chordal apparatus or less likely calcified  thrombus/vegetaton -clinical correlation advised. Right ventricular  systolic function is normal. The right ventricular   size is normal. There is mildly elevated pulmonary artery systolic  pressure. The estimated right ventricular systolic pressure is 33.2 mmHg.   3. Left atrial size was moderately dilated.   4. The mitral valve is abnormal. Trivial mitral valve regurgitation. No  evidence of mitral stenosis. Moderate mitral annular calcification.   5. The aortic valve has been repaired/replaced. Aortic valve  regurgitation is trivial. There is a 26 mm Medtronic Evolut valve present  in the aortic position. Procedure Date: 10/26/2020. Echo findings are  consistent with perivalvular leak of the aortic  prosthesis. Aortic valve area, by VTI measures 2.05 cm. Aortic valve mean  gradient measures 11.0 mmHg. Aortic valve Vmax measures 2.44 m/s.   6. The inferior vena cava is normal in size with greater than 50%  respiratory variability, suggesting right atrial pressure of 3 mmHg.   Comparison(s): Changes from prior study are noted. 11/02/2020: LVEF 60-65%,  TAVR with mean gradient of 9 mmHg, no perivalvular leak.    Recent Labs: 11/01/2020: B Natriuretic Peptide 124.6 02/02/2021: Magnesium 1.5; NT-Pro BNP 623 02/16/2021: ALT 12; TSH 7.54 03/31/2021: Platelets 252 04/07/2021: BUN 21; Creatinine, Ser 1.55; Hemoglobin 7.8; Potassium 5.0; Sodium 137  Recent Lipid Panel    Component Value Date/Time  CHOL 92 02/16/2021 1455   TRIG 88.0 02/16/2021 1455   TRIG 90 12/01/2005 1330   HDL 34.60 (L) 02/16/2021 1455   CHOLHDL 3 02/16/2021 1455   VLDL 17.6 02/16/2021 1455   LDLCALC 40 02/16/2021 1455     Risk Assessment/Calculations:    CHA2DS2-VASc Score = 8   This indicates a 10.8% annual risk of stroke. The patient's score is based upon: CHF History: 0 HTN History: 1 Diabetes History: 1 Stroke History: 2 Vascular Disease History: 1 Age Score: 2 Gender Score: 1          Physical Exam:    VS:  BP (!) 146/60    Pulse (!) 56    Ht _0  (1.549 m)    Wt 190 lb 9.6 oz (86.5 kg)    SpO2 97%    BMI 36.01 kg/m     Wt Readings from Last 3 Encounters:  04/08/21 190 lb 9.6 oz (86.5 kg)  03/25/21 190 lb (86.2 kg)  03/23/21 190 lb (86.2 kg)     GEN:  Pleasant elderly woman in no acute distress HEENT: Normal NECK: No JVD; No carotid bruits LYMPHATICS: No lymphadenopathy CARDIAC: RRR, 2/6 systolic murmur at the RUSB RESPIRATORY:  Clear to auscultation without rales, wheezing or rhonchi  ABDOMEN: Soft, non-tender, non-distended MUSCULOSKELETAL:  Trace bilateral pretibial edema; No deformity  SKIN: Warm and dry NEUROLOGIC:  Alert and oriented x 3 PSYCHIATRIC:  Normal affect   ASSESSMENT:    1. Paroxysmal atrial fibrillation (HCC)    PLAN:    In order of problems listed above:  81 yo woman with paroxysmal atrial fibrillation, managed with rhythm control on amiodarone, with CHADS2Vasc score of 8. She presents today for follow-up discussion for Watchman LAAO therapy. Comorbid conditions include advanced age, reduced functional capacity, stage 3b CKD, chronic diastolic HF, aortic stenosis s/p TAVR, and recurrent anemia unable to tolerate even reduced dose apixaban. After review of treatment options and shared decision-making discussion, I would favor conservative therapy for this patient. I'm concerned about the degree of comorbid conditions for Watchman implant making the  procedure risk/benefit unfavorable for this patient. In addition, based on her review of her labs and history of recurrent anemia requiring transfusion, I'm not confident that she will be able to tolerate even an intermediate length course of anticoagulation, placing her at increased risk of device related thrombus. She is in agreement with this approach. She will not go back on anticoagulation unless directed otherwise by her treating physicians. Fortunately she appears to be remaining in sinus rhythm on amiodarone. I would be happy to see her in the future if her condition changes.            Medication Adjustments/Labs and Tests Ordered: Current medicines are reviewed at length with the patient today.  Concerns regarding medicines are outlined above.  No orders of the defined types were placed in this encounter.  No orders of the defined types were placed in this encounter.   Patient Instructions  Medication Instructions:  Your physician recommends that you continue on your current medications as directed. Please refer to the Current Medication list given to you today.  *If you need a refill on your cardiac medications before your next appointment, please call your pharmacy*   Lab Work: None  If you have labs (blood work) drawn today and your tests are completely normal, you will receive your results only by: [place] (if you have MyChart) OR A paper copy in the mail If  you have any lab test that is abnormal or we need to change your treatment, we will call you to review the results.   Testing/Procedures: None  Follow-Up: AS NEEDED1}    Other Instructions      Signed, Sherren Mocha, MD  04/12/2021 8:53 AM    [place]

## 2021-04-10 ENCOUNTER — Other Ambulatory Visit: Payer: Self-pay | Admitting: Cardiovascular Disease

## 2021-04-11 ENCOUNTER — Other Ambulatory Visit: Payer: Self-pay | Admitting: Physician Assistant

## 2021-04-11 NOTE — Telephone Encounter (Signed)
Prescription refill request for Eliquis received. ?Indication: afib  ?Last office visit: 04/08/2021 ?Scr: 1.55, 04/07/2021 ?Age: 81 yo  ?Weight: 86.5 kg  ? ?Refill sent ?

## 2021-04-12 ENCOUNTER — Encounter: Payer: Self-pay | Admitting: Cardiovascular Disease

## 2021-04-12 ENCOUNTER — Other Ambulatory Visit (HOSPITAL_COMMUNITY): Payer: Medicare Other

## 2021-04-12 ENCOUNTER — Telehealth: Payer: Self-pay

## 2021-04-12 MED ORDER — POTASSIUM CHLORIDE CRYS ER 20 MEQ PO TBCR: 20.0000 meq | EXTENDED_RELEASE_TABLET | Freq: Every day | ORAL | 3 refills | Status: DC

## 2021-04-12 NOTE — Telephone Encounter (Signed)
Mary Mccarthy states her pharmacy called stating her Eliquis is ready for pick-up and is confused because she thought Dr. Burt Knack told her not to resume. ? ?Per Dr. Antionette Char note from 04/08/2021, "I'm not confident that she will be able to tolerate even an intermediate length course of anticoagulation, placing her at increased risk of device related thrombus. She is in agreement with this approach. She will not go back on anticoagulation unless directed otherwise by her treating physicians. Fortunately she appears to be remaining in sinus rhythm on amiodarone. I would be happy to see her in the future if her condition changes. " ? ?Mary Mccarthy had been holding the medication for a few weeks because of low hemoglobin. Reiterated to her NOT to resume Eliquis. She will call her pharmacy and confirm with them the medication has been discontinued (until/unless another provider on her team instructs her to restart).  ? ?Updated patient's medication list.  ? ?The patient was grateful for assistance and agrees with plan.  ?

## 2021-04-14 ENCOUNTER — Encounter (HOSPITAL_COMMUNITY): Admission: RE | Payer: Medicare Other | Source: Home / Self Care

## 2021-04-14 ENCOUNTER — Ambulatory Visit (HOSPITAL_COMMUNITY)
Admission: EM | Admit: 2021-04-14 | Discharge: 2021-04-14 | Disposition: A | Discharge status: Home / Self Care | Payer: Medicare Other | Attending: Family Medicine | Admitting: Family Medicine

## 2021-04-14 ENCOUNTER — Other Ambulatory Visit: Payer: Self-pay

## 2021-04-14 ENCOUNTER — Inpatient Hospital Stay (HOSPITAL_COMMUNITY): Admission: RE | Admit: 2021-04-14 | Payer: Medicare Other | Source: Home / Self Care | Admitting: Cardiovascular Disease

## 2021-04-14 ENCOUNTER — Encounter (HOSPITAL_COMMUNITY): Payer: Self-pay | Admitting: Emergency Medicine

## 2021-04-14 DIAGNOSIS — L03113 Cellulitis of right upper limb: Secondary | ICD-10-CM

## 2021-04-14 DIAGNOSIS — L03119 Cellulitis of unspecified part of limb: Secondary | ICD-10-CM

## 2021-04-14 SURGERY — LEFT ATRIAL APPENDAGE OCCLUSION
Anesthesia: General

## 2021-04-14 MED ORDER — AMOXICILLIN-POT CLAVULANATE 875-125 MG PO TABS: 1.0000 | ORAL_TABLET | Freq: Two times a day (BID) | ORAL | 0 refills | Status: DC

## 2021-04-14 NOTE — ED Provider Notes (Signed)
[place]    CSN: [id number] Arrival date & time: 04/14/21  1012      History   Chief Complaint Chief Complaint  Patient presents with   Hand Pain    HPI Mary Mccarthy is a 81 y.o. female.    Hand Pain  Here for swelling and redness in her right hand and arm that she first noted yesterday.  This morning and had increased in size.  It is tender.  Or chills.  No cough and cold symptoms  Past Medical History:  Diagnosis Date   ALLERGIC RHINITIS 06/24/2007   Anemia    AVM (arteriovenous malformation) of colon    Blood transfusion without reported diagnosis 05/2015   COLONIC POLYPS, HX OF 06/24/2007   Coronary artery disease, non-occlusive 05/2015   Trop + w/ Acute Anemia =>CATH: small RI - Ostial 60%, ostial RCA 30% and dLAD 40-50%;; 9/'21: Cor Ca Score 624. Mild (25-49%) prox RCA & LAD,; Moderate (50-69%) Ostial Small RI & prox LCx.     DEPRESSION 10/08/2006   DIABETES MELLITUS, TYPE II 10/05/2006   GERD 06/24/2007   History of CVA (cerebrovascular accident) 08/10/2020   08/2020- found to be in afib with RVR, started on Eliquis (shower of emboli from A. fib as complication of severe AS)   HYPERLIPIDEMIA 10/08/2006   HYPERTENSION 10/08/2006   INSOMNIA 09/24/2008   Left knee DJD    Nonsustained paroxysmal ventricular tachycardia 10/2018   Post-TAVR Zio Patch: Frequent (206) runs of NSVT - Fastest 5 beats 239 bpm. Longest 9.3 Sec avg 135 bpm. (? possibly Afib w/ aberrancy)   Osteoporosis 03/10/2016   PAF (paroxysmal atrial fibrillation) (HCC)    PAF with RVR: CHA2DS2-VASc 9.  On Eliquis and amiodarone. 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 Angelena Form & Dr. Cyndia Bent   Severe aortic stenosis 08/2020   Progression from Mod-Severe AS to Severe AS - noted on Echo 08/2020 -> (mean gradient progressed from 34 to 42 mmHg) this was  in setting of new onset A. fib RVR, acute diastolic HF and shower of emboli CVA. ->  Referred for TAVR, completed 10/26/2020    Patient Active Problem List   Diagnosis Date Noted   Encounter for well adult exam with abnormal findings 02/16/2021   Long term current use of amiodarone for rate-rhythm control of A. fib 11/28/2020   Chronic respiratory failure ([place]) 11/22/2020   Dehydration 11/02/2020   Diabetes mellitus type 2 in obese ([place]) 11/02/2020   AKI (acute kidney injury) ([place]) 11/01/2020   Transient post TAVR CHB -> not seen on follow-up Zio patch 10/28/2020   S/P TAVR (transcatheter aortic valve replacement) 10/26/2020   Melena 10/20/2020   Symptomatic anemia 10/19/2020   Acute bronchitis 09/21/2020   Current use of long term anticoagulation - for Afib; CHA2DS2-VASc 9, on Eliquis 08/23/2020   PAF with RVR: CHA2DS2-VASc 9.  On Eliquis and amiodarone. 08/11/2020   History of CVA (cerebrovascular accident) 08/10/2020   Hypomagnesemia 08/10/2020   Severe aortic stenosis 08/2020   Aortic atherosclerosis ([place]) 03/18/2020   B12 deficiency 09/21/2019   Cough 03/14/2018   Wheezing 03/14/2018   Anxiety 09/11/2017   Osteoporosis 03/10/2016   Coronary artery disease, non-occlusive 05/24/2015   GI bleed    Pulmonary hypertension (HCC)    CHF (congestive heart failure), NYHA class II, chronic, diastolic ([place]) 81/11/3157  Gastric ulceration    AVM (arteriovenous malformation) of colon    Demand ischemia of myocardium (HCC)    Right knee pain 05/21/2014   Anemia, iron deficiency 05/20/2013   MENOPAUSAL DISORDER 12/07/2009   INSOMNIA 09/24/2008   ALLERGIC RHINITIS 06/24/2007   GERD 06/24/2007   COLONIC POLYPS, HX OF 06/24/2007   Hyperlipidemia with target LDL less than 70 10/08/2006   Anxiety with depression 10/08/2006   Essential hypertension 10/08/2006   Peptic ulcer 10/08/2006   Type 2 diabetes mellitus with hyperglycemia, without long-term current use of insulin ([place]) 10/05/2006    Morbid obesity ([place]) 10/05/2006    Past Surgical History:  Procedure Laterality Date   APPENDECTOMY     BIOPSY  10/21/2020   Procedure: BIOPSY;  Surgeon: Sharyn Creamer, MD;  Location: [facility] ENDOSCOPY;  Service: Gastroenterology;;   BREAST BIOPSY     CARDIAC CATHETERIZATION N/A 05/21/2015   Procedure: Left Heart Cath and Coronary Angiography;  Surgeon: Leonie Man, MD;  Location: [place] [facility];  Service: Cardiovascular;: Ost RI 60%, Ost RCA 30%, dLAD tapers to small vessel w/ 40-50%. Mildly elevated LVEDP. Normal LV Fxn.   COLONOSCOPY N/A 05/20/2015   Procedure: COLONOSCOPY;  Surgeon: Manus Gunning, MD;  Location: [facility];  Service: Gastroenterology;  Laterality: N/A;   CORONARY CA2+ SCORE / CARDIAC CT ANGIOGRAM  10/09/2019   Calcium score 624.  82nd percentile. Dominant RCA: Mild (25-49%) proximal stenosis-distal bifurcation into PDA and PAV--< RPL branches.  LAD (1 major mid vessel diagonal) diffuse calcified plaque, mild proximal stenosis with minimal distal stenosis.  Small RI moderate ostial disease.  LCx-moderate mixed (50-69%) proximal stenosis.  Small dOM1 disease.  Trileaflet AoV, annular Ca2+ - probable AS   ESOPHAGOGASTRODUODENOSCOPY N/A 05/20/2015   Procedure: ESOPHAGOGASTRODUODENOSCOPY (EGD);  Surgeon: Manus Gunning, MD;  Location: [facility];  Service: Gastroenterology;  Laterality: N/A;   ESOPHAGOGASTRODUODENOSCOPY (EGD) WITH PROPOFOL N/A 10/21/2020   Procedure: ESOPHAGOGASTRODUODENOSCOPY (EGD) WITH PROPOFOL;  Surgeon: Sharyn Creamer, MD;  Location: [place];  Service: Gastroenterology;  Laterality: N/A;   INTRAOPERATIVE TRANSTHORACIC ECHOCARDIOGRAM N/A 10/26/2020   Procedure: INTRAOPERATIVE TRANSTHORACIC ECHOCARDIOGRAM;  Surgeon: Angelena Form; Location: [facility] OR; Pre-TAVR well-visualized calcified AoV mean Grad 37 mmHg, AVA 0.72 cm => Post TAVR well-positioned supra-annular 26 mm Medtronic Evolut Pro valve placed with no PVL.  Mean gradient 14 mmHg.   AVR 1.58 cm.  Normal flow to RCA and LM-LAD.  ; EF 60 to 65%.  Degenerative severe MAC w/ mild MR.   RIGHT/LEFT HEART CATH AND CORONARY ANGIOGRAPHY N/A 09/29/2020   Procedure: RIGHT/LEFT HEART CATH AND CORONARY ANGIOGRAPHY;  Surgeon: Burnell Blanks, MD;  Location: [place] [facility];  Service: Cardiovascular; pre-TAVR:  pRCA 20%, m-dRCA 30%. D LM-pLAD 20%. RI 30%, mLAD 20%.  Severe AS with mean gradient measured at 52.8 mmHg.   TONSILLECTOMY     TRANSCATHETER AORTIC VALVE REPLACEMENT, TRANSFEMORAL N/A 10/26/2020   Procedure: TRANSCATHETER AORTIC VALVE REPLACEMENT, TRANSFEMORAL;  Surgeon: Burnell Blanks, MD;  Location: [facility] OR;  Service: Open Heart Surgery;; Medtronic Evolut-Pro + (size 26 mm, model # EVPROPLUS -26US, serial # [id number]); transient high-grade AV block noted so PPM left in place.   TRANSTHORACIC ECHOCARDIOGRAM  03/17/2019   a) 03/2019: EF 60 to 65%.  Moderate LVH.  GRII DD.  Mod-Severe AS (m grad 36 mmHg, peak 59 mmHg); b) 09/2019: EF 65-70%, No RWMA. Gr1 DD/hi LAP, Mild hi PAP. Mod LA Dil. MOD AS (mean Grad 34.5 mmHg).  STABLE   TRANSTHORACIC  ECHOCARDIOGRAM  08/27/2020   Admitted for CVA/Afib RVR & CHF (Progression to SEVERE SYMPTOMATIC AS):  Severe Calcific Aortic Stenosis (VTI AVA estimated 0.86 cm, mean gradient 42 mmHg, V-max 4.36 m/s).  EF 55 to 60%.  Severe concentric LVH.  Unable to determine diastolic parameters.  Moderately elevated PAP.  Mild LA dilation.  Mild circumferential pericardial effusion.  Trivial MR.  Severe MAC.   TRANSTHORACIC ECHOCARDIOGRAM  11/25/2020   1 Month s/p TAVR: EF 55 to 60%.  No R WMA.  GR 1 DD.  Elevated LVEDP.  Normal RV size with mildly elevated RVP estimated 44 mmHg.  Oscillating density in the RV suspect calcified chordal apparatus versus calcified thrombus.  Moderate LA dilation.  Normal IVC/RA P => Well-positioned 26 mm Medtronic Evolut Pro THVwith mean AOV gradient 11 mmHg.  Trivial PVL   TRANSTHORACIC ECHOCARDIOGRAM  11/02/2020    a) Day 1 Post-op TAVR 10/27/20:  Well-Positioned Supra Annular Medtronic Evolut Pro THP.  Mean gradient 12 mmHg, peak 24 mmHg.  AVA 1.8 cm.  Trivial PVL.  EF 60 to 65%.  Normal LV function.  Mild LVH.  Severe LA dilation.  Mild RA dilation.  Severe MAC;; b) 11/02/20: Normal structure and function of the aortic valve prosthesis.  Mean gradient 9 mmHg (otherwise stable)   TUBAL LIGATION      OB History   No obstetric history on file.      Home Medications    Prior to Admission medications   Medication Sig Start Date End Date Taking? Authorizing Provider  amoxicillin-clavulanate (AUGMENTIN) 875-125 MG tablet Take 1 tablet by mouth 2 (two) times daily for 7 days. 04/14/21 04/21/21 Yes Shalea Tomczak, Gwenlyn Perking, MD  acetaminophen (TYLENOL) 500 MG tablet Take 500 mg by mouth every 6 (six) hours as needed for moderate pain.    [provider]  ALPRAZolam Duanne Moron) 0.25 MG tablet TAKE 1 TABLET BY MOUTH THREE TIMES A DAY AS NEEDED FOR ANXIETY 04/02/21   Biagio Borg, MD  amiodarone (PACERONE) 200 MG tablet Take 1 tablet (200 mg total) by mouth daily. 10/28/20   Eileen Stanford, PA-C  amLODipine (NORVASC) 10 MG tablet Take 1 tablet (10 mg total) by mouth daily. 11/04/20   Dwyane Dee, MD  atorvastatin (LIPITOR) 40 MG tablet TAKE 1 TABLET BY MOUTH EVERY DAY 08/20/20   Leonie Man, MD  blood glucose meter kit and supplies Dispense based on patient and insurance preference. Use up to four times daily as directed. E11.9 04/15/18   Biagio Borg, MD  Blood Glucose Monitoring Suppl (ONE TOUCH ULTRA 2) w/Device KIT Use as directed daily 06/01/15   Biagio Borg, MD  citalopram (CELEXA) 10 MG tablet Take 1 tablet (10 mg total) by mouth daily. 02/16/21 02/16/22  Biagio Borg, MD  cyanocobalamin ([employer] VITAMIN B12) 1000 MCG tablet TAKE 1 TABLET BY MOUTH EVERY DAY 11/12/20   Biagio Borg, MD  furosemide (LASIX) 40 MG tablet Take 1 tablet (40 mg total) by mouth daily. 10/28/20   Eileen Stanford, PA-C  Lancets  (ONETOUCH DELICA PLUS EPPIRJ18A) MISC USE AS DIRECTED TEST 1-2 TIMES A DAY 09/05/20   Biagio Borg, MD  losartan (COZAAR) 50 MG tablet Take 0.5 tablets (25 mg total) by mouth daily. 02/16/21 05/17/21  Biagio Borg, MD  metFORMIN (GLUCOPHAGE) 1000 MG tablet 1/2 tab by mouth twice per day 02/16/21   Biagio Borg, MD  metoprolol succinate (TOPROL-XL) 25 MG 24 hr tablet Take 0.5 tablets (12.5  mg total) by mouth daily. 11/05/20   Dwyane Dee, MD  mirtazapine (REMERON) 15 MG tablet Take 15 mg by mouth at bedtime.    Biagio Borg, MD  [facility] ULTRA test strip USE AS INSTRUCTED TEST 1-2 TIMES A DAY 10/12/20   Biagio Borg, MD  pantoprazole (PROTONIX) 40 MG tablet TAKE 1 TABLET BY MOUTH TWICE A DAY 03/07/21   Eileen Stanford, PA-C  potassium chloride SA (KLOR-CON M) 20 MEQ tablet Take 1 tablet (20 mEq total) by mouth daily. 04/12/21   Sherren Mocha, MD    Family History Family History  Problem Relation Age of Onset   Alcohol abuse Other    Arthritis Other        DJD   Hyperlipidemia Other    Heart disease Other    Stroke Other    Hypertension Other    Depression Other    Diabetes Other    Cancer Mother        ENT cancer   CAD Sister        Several MI & PPM; long term smoker, EtOH   Heart attack Sister 52       several   Arrhythmia Sister        s/p PPM (Dr. Sallyanne Kuster)    Social History Social History   Tobacco Use   Smoking status: Never   Smokeless tobacco: Never  Vaping Use   Vaping Use: Never used  Substance Use Topics   Alcohol use: No    Alcohol/week: 0.0 standard drinks   Drug use: No     Allergies   Ibuprofen   Review of Systems Review of Systems   Physical Exam Triage Vital Signs ED Triage Vitals  Enc Vitals Group     BP 04/14/21 1057 (!) 180/74     Pulse Rate 04/14/21 1057 62     Resp 04/14/21 1057 16     Temp 04/14/21 1057 98.6 F (37 C)     Temp Source 04/14/21 1057 Oral     SpO2 04/14/21 1057 93 %     Weight --      Height --      Head  Circumference --      Peak Flow --      Pain Score 04/14/21 1056 6     Pain Loc --      Pain Edu? --      Excl. in [place]? --    No data found.  Updated Vital Signs BP (!) 180/74    Pulse 62    Temp 98.6 F (37 C) (Oral)    Resp 16    SpO2 93%   Visual Acuity Right Eye Distance:   Left Eye Distance:   Bilateral Distance:    Right Eye Near:   Left Eye Near:    Bilateral Near:     Physical Exam Vitals reviewed.  Constitutional:      General: She is not in acute distress.    Appearance: She is not ill-appearing, toxic-appearing or diaphoretic.  Cardiovascular:     Rate and Rhythm: Normal rate and regular rhythm.  Pulmonary:     Breath sounds: Normal breath sounds.  Skin:    Capillary Refill: Capillary refill takes less than 2 seconds.     Coloration: Skin is not jaundiced or pale.     Comments: There is a mildly erythematous area on her right hand and wrist on the ulnar aspect.  It extends approximately 8 cm x 4 cm.  There is only slight induration.  It is tender.  No fluctuance.  No abrasion or break in skin noted that could be an entry point.  Neurological:     General: No focal deficit present.     Mental Status: She is oriented to person, place, and time.  Psychiatric:        Behavior: Behavior normal.     UC Treatments / Results  Labs (all labs ordered are listed, but only abnormal results are displayed) Labs Reviewed - No data to display  EKG   Radiology No results found.  Procedures Procedures (including critical care time)  Medications Ordered in UC Medications - No data to display  Initial Impression / Assessment and Plan / UC Course  I have reviewed the triage vital signs and the nursing notes.  Pertinent labs & imaging results that were available during my care of the patient were reviewed by me and considered in my medical decision making (see chart for details).    Will treat for cellulitis with Augmentin.  Warnings given for the ER if she is  worsening instead of improving. Final Clinical Impressions(s) / UC Diagnoses   Final diagnoses:  Cellulitis of hand  Cellulitis of wrist     Discharge Instructions      I do think you have cellulitis of your hand and wrist.  Take amoxicillin-clavulanate 875 mg--1 tablet twice daily with food for 7 days     ED Prescriptions     Medication Sig Dispense Auth. Provider   amoxicillin-clavulanate (AUGMENTIN) 875-125 MG tablet Take 1 tablet by mouth 2 (two) times daily for 7 days. 14 tablet Lillymae Duet, Gwenlyn Perking, MD      PDMP not reviewed this encounter.   Barrett Henle, MD 04/14/21 [phone]

## 2021-04-14 NOTE — ED Triage Notes (Signed)
PT reports right hand and arm swelling that was present yesterday AM when she woke up. Affected area is red and warm. No known injury or insect bite.  ?

## 2021-04-14 NOTE — Discharge Instructions (Addendum)
I do think you have cellulitis of your hand and wrist. ? ?Take amoxicillin-clavulanate 875 mg--1 tablet twice daily with food for 7 days ?

## 2021-04-14 NOTE — Telephone Encounter (Signed)
Note not needed 

## 2021-04-15 ENCOUNTER — Ambulatory Visit (INDEPENDENT_AMBULATORY_CARE_PROVIDER_SITE_OTHER): Payer: Medicare Other | Admitting: *Deleted

## 2021-04-15 ENCOUNTER — Encounter: Payer: Self-pay | Admitting: *Deleted

## 2021-04-15 DIAGNOSIS — I5032 Chronic diastolic (congestive) heart failure: Secondary | ICD-10-CM

## 2021-04-15 DIAGNOSIS — I480 Paroxysmal atrial fibrillation: Secondary | ICD-10-CM

## 2021-04-15 DIAGNOSIS — E1165 Type 2 diabetes mellitus with hyperglycemia: Secondary | ICD-10-CM

## 2021-04-15 NOTE — Chronic Care Management (AMB) (Signed)
Chronic Care Management   CCM RN Visit Note  04/15/2021 Name: Mattison Golay MRN: [id number] DOB: 04/12/1940  Subjective: Kendell Gammon is a 81 y.o. year old female who is a primary care patient of Biagio Borg, MD. The care management team was consulted for assistance with disease management and care coordination needs.    Engaged with patient by telephone for  acute/ unscheduled outreach  in response to provider referral for case management and/or care coordination services.   Consent to Services:  The patient was given information about Chronic Care Management services, agreed to services, and gave verbal consent prior to initiation of services.  Please see initial visit note for detailed documentation.   Patient agreed to services and verbal consent obtained.   Assessment: Review of patient past medical history, allergies, medications, health status, including review of consultants reports, laboratory and other test data, was performed as part of comprehensive evaluation and provision of chronic care management services.  CCM Care Plan  Allergies  Allergen Reactions   Ibuprofen Other (See Comments)    Bleeding events   Outpatient Encounter Medications as of 04/15/2021  Medication Sig   amoxicillin-clavulanate (AUGMENTIN) 875-125 MG tablet Take 1 tablet by mouth 2 (two) times daily for 7 days.   acetaminophen (TYLENOL) 500 MG tablet Take 500 mg by mouth every 6 (six) hours as needed for moderate pain.   ALPRAZolam (XANAX) 0.25 MG tablet TAKE 1 TABLET BY MOUTH THREE TIMES A DAY AS NEEDED FOR ANXIETY   amiodarone (PACERONE) 200 MG tablet Take 1 tablet (200 mg total) by mouth daily.   amLODipine (NORVASC) 10 MG tablet Take 1 tablet (10 mg total) by mouth daily.   atorvastatin (LIPITOR) 40 MG tablet TAKE 1 TABLET BY MOUTH EVERY DAY   blood glucose meter kit and supplies Dispense based on patient and insurance preference. Use up to four times daily as directed. E11.9   Blood Glucose  Monitoring Suppl (ONE TOUCH ULTRA 2) w/Device KIT Use as directed daily   citalopram (CELEXA) 10 MG tablet Take 1 tablet (10 mg total) by mouth daily.   cyanocobalamin ([employer] VITAMIN B12) 1000 MCG tablet TAKE 1 TABLET BY MOUTH EVERY DAY   furosemide (LASIX) 40 MG tablet Take 1 tablet (40 mg total) by mouth daily.   Lancets (ONETOUCH DELICA PLUS [id number]) MISC USE AS DIRECTED TEST 1-2 TIMES A DAY   losartan (COZAAR) 50 MG tablet Take 0.5 tablets (25 mg total) by mouth daily.   metFORMIN (GLUCOPHAGE) 1000 MG tablet 1/2 tab by mouth twice per day   metoprolol succinate (TOPROL-XL) 25 MG 24 hr tablet Take 0.5 tablets (12.5 mg total) by mouth daily.   mirtazapine (REMERON) 15 MG tablet Take 15 mg by mouth at bedtime.   ONETOUCH ULTRA test strip USE AS INSTRUCTED TEST 1-2 TIMES A DAY   pantoprazole (PROTONIX) 40 MG tablet TAKE 1 TABLET BY MOUTH TWICE A DAY   potassium chloride SA (KLOR-CON M) 20 MEQ tablet Take 1 tablet (20 mEq total) by mouth daily.   No facility-administered encounter medications on file as of 04/15/2021.   Patient Active Problem List   Diagnosis Date Noted   Encounter for well adult exam with abnormal findings 02/16/2021   Long term current use of amiodarone for rate-rhythm control of A. fib 11/28/2020   Chronic respiratory failure ([place]) 11/22/2020   Dehydration 11/02/2020   Diabetes mellitus type 2 in obese ([place]) 11/02/2020   AKI (acute kidney injury) ([place]) 11/01/2020   Transient post TAVR  CHB -> not seen on follow-up Zio patch 10/28/2020   S/P TAVR (transcatheter aortic valve replacement) 10/26/2020   Melena 10/20/2020   Symptomatic anemia 10/19/2020   Acute bronchitis 09/21/2020   Current use of long term anticoagulation - for Afib; CHA2DS2-VASc 9, on Eliquis 08/23/2020   PAF with RVR: CHA2DS2-VASc 9.  On Eliquis and amiodarone. 08/11/2020   History of CVA (cerebrovascular accident) 08/10/2020   Hypomagnesemia 08/10/2020   Severe aortic stenosis 08/2020   Aortic  atherosclerosis ([place]) 03/18/2020   B12 deficiency 09/21/2019   Cough 03/14/2018   Wheezing 03/14/2018   Anxiety 09/11/2017   Osteoporosis 03/10/2016   Coronary artery disease, non-occlusive 05/24/2015   GI bleed    Pulmonary hypertension (HCC)    CHF (congestive heart failure), NYHA class II, chronic, diastolic ([place]) 56/43/3295   Gastric ulceration    AVM (arteriovenous malformation) of colon    Demand ischemia of myocardium (HCC)    Right knee pain 05/21/2014   Anemia, iron deficiency 05/20/2013   MENOPAUSAL DISORDER 12/07/2009   INSOMNIA 09/24/2008   ALLERGIC RHINITIS 06/24/2007   GERD 06/24/2007   COLONIC POLYPS, HX OF 06/24/2007   Hyperlipidemia with target LDL less than 70 10/08/2006   Anxiety with depression 10/08/2006   Essential hypertension 10/08/2006   Peptic ulcer 10/08/2006   Type 2 diabetes mellitus with hyperglycemia, without long-term current use of insulin ([place]) 10/05/2006   Morbid obesity ([place]) 10/05/2006   Conditions to be addressed/monitored:  Atrial Fibrillation, CHF, and DMII  Care Plan : RN Care Manager Plan of Care  Updates made by Knox Royalty, RN since 04/15/2021 12:00 AM     Problem: Chronic Disease Management Needs   Priority: High     Long-Range Goal: Development of plan of care for long term chronic disease management   Start Date: 11/17/2020  Expected End Date: 11/17/2021  Priority: High  Note:   Current Barriers:  Chronic Disease Management support and education needs related to CHF and DMII Lives with son- who works during daytime hours: alone during day, limited stamina to perform ADL's/ iADL's after multiple recent hospitalizations Fragile state of health, multiple progressing chronic health conditions Confusion around multiple new medications, post- recent hospitalizations- 12/06/20- confirmed patient has spoken with CCM Pharmacy team; 02/21/21- confirmed CCM pharmacy team active 12/06/20: Patient has received home oxygen, however,  she reports not using: states oxygenator is "too loud" and keeps her awake: 01/19/21: Now resolved 12/20/20-- reports unable to get through to [place]: care coordination outreach placed on patient's behalf 01/19/21: reports [employer] came out to her home after last care coordination outreach and replaced the oxygenator- reports "it is much better and much more quiet; I am using it occasionally" 03/30/21: patient reports "rarely" uses home O2; she is considering having this returned to company Multiple recent inpatient hospitalizations: 03/30/21: confirmed no new/ recent hospital or ED visits since last discharge 11/04/20 August 22-26, 2022: CHF September 12-22, 2022: AF with RVR; CHF; TAVR September 26-29, 2022: N/V, AKI  RNCM Clinical Goal(s):  Patient will verbalize basic understanding of CHF and DMII disease process and self health management plan CHF; A-Fib; DMII  through collaboration with RN Care manager, provider, and care team.   Interventions: 1:1 collaboration with primary care provider regarding development and update of comprehensive plan of care as evidenced by provider attestation and co-signature Inter-disciplinary care team collaboration (see longitudinal plan of care) Evaluation of current treatment plan related to  self management and patient's adherence to plan as established by provider  04/15/21: Acute/unscheduled off-hours call with voice message received from patient 04/14/21: she requested call back for unknown reason; upon going to place call 04/14/21, noted patient was at [facility] for (R) UE swelling/ cellulitis: discharged home to self-care on antibiotics for presumable cellulitis Contacted patient 04/15/21 to follow up on [place] visit yesterday Reports "doing much better" on  antibiotics prescribed yesterday at [facility]; confirms she obtained and is taking antibiotics as prescribed; states "it just swelled up over 2 days, I don't know why" States hand is less swollen today, less "hot  feeling," and "less sore"- denies "true pain" in (R) since antibiotics started yesterday Positive reinforcement provided for patient going to [facility] to have (R) UE swelling immediately addressed Denies other clinical concerns today: reports "blood sugars better than before," with consistent fasting/ post-prandial values between 140-170; confirms weights at home remain "right at 190 lbs;" denies issues/ concerns around breathing status, signs/ symptoms development of AF Confirmed that previously scheduled Watchman's device insertion on 04/14/21 was cancelled by Burt Knack, "due to my anemia, it is too bad" Confirmed patient still not taking Eliquis: Dr. Rema Fendt will address during office visit 05/02/21 Confirmed patient is aware of/ has plans to attend as scheduled PCP office visit 04/20/20- she is grateful for this appointment after her development of (R) UE swelling/ cellulitis Reminded patient of upcoming scheduled CCM team phone calls: Pharmacy 04/21/21 and then my next outreach 05/10/21, post-upcoming scheduled provider office visit  From last outreach 03/30/21: Pain assessment updated: denies acute/ chronic pain Falls assessment updated: denies new/ recent falls, continues using cane as needed/ indicated; previously provided education around fall risks/ prevention reinforced; positive reinforcement provided with encouragement to continue efforts to stay fall-free Confirmed patient will be having upcoming procedure April 14, 2021 for watchman's-- she reports "being very nervous" about this procedure, but has no specific questions; confirms she has talked to her cardiology providers and is going through with procedure, despite being "jittery" about it; she confirms she received the printed educational information I previously sent her  Heart Failure Interventions:  (Status: 03/30/21: Goal on Track (progressing): YES.) Long term Goal Discussed importance of daily weight and advised patient to weigh and record  daily Reviewed role of diuretics in prevention of fluid overload and management of heart failure Discussed the importance of keeping all appointments with provider Reviewed recent weights at home: reports consistently between 188-190 lbs, with weight this morning of "190 lbs;" denies weight gain > 3 lbs overnight/ 5 lbs in one week Reinforced previously provided education monitoring daily weights/ breathing status at home, and to follow action plan for yellow CHF zone: patient continues to require ongoing reinforcement/ support Discussed use of home oxygen at home: again reports not using, reports "breathing is better overall;" states she uses "maybe" once a week for short periods of time, but also states she hopes that she will no longer need home O2 after her upcoming cardiac procedure, and verbalizes plans to send back to company, if possible Reviewed recent cardiology provider office visits in mid-February; she verbalizes understanding to continue remaining OFF of Eliquis-- and confirms she has not been taking since she was advised to stop in mid-February due to anemia requiring blood transfusion  Confirms no recent signs/ symptoms A-Fib-- signs/ symptoms along with corresponding action plan reinforced-- she will require ongoing support/ reinforcement Reviewed upcoming provider office visit with patient: 04/12/21- lab work/ screening pre- watchman's procedure; 04/20/21- PCP office visit; 04/21/21- CCM pharmacy team; 05/02/21- cardiology/ primary/ Dr. Ellyn Hack: patient verbalizes awareness of  all appointments and plans to attend as scheduled  Diabetes:  (Status: 03/30/21: Goal on Track (progressing): YES.) Long term Goal Lab Results  Component Value Date   HGBA1C 7.5 (H) 02/16/2021  Provided education to patient about basic DM disease process; Discussed plans with patient for ongoing care management follow up and provided patient with direct contact information for care management team;      Review of  patient status, including review of consultants reports, relevant laboratory and other test results, and medications completed;       Confirmed patient continues monitoring/ recording blood sugars at home BID: fasting and post-prandial Reviewed with patient recent blood sugars at home: today reports general ranges between 140-190, both fasting and post-prandial; denies recent low blood sugars; denies recent medication changes to DM meds; confirms taking metformin 500 mg BID Reinforced previously provided education on dietary strategies for low salt/ heart healthy; diabetic/ low sugar/ carb diet: reports appetite "comes and goes" again around anxiety around upcoming procedure; confirms she is taking prn xanax "every couple of days" and confirms it helps decrease her anxiety Patient admits that her priority now is getting upcoming watchman's device and states she will try to put more effort in to managing DMII after the procedure, "if everything goes well"  Patient Goals/Self-Care Activities: As evidenced by review of EHR, collaboration with care team, and patient reporting during CCM RN CM outreach,  Patient Raiya will: Take medications as prescribed   Attend all scheduled provider appointments  Call pharmacy for medication refills  Call provider office for new concerns or questions  Continue to follow heart healthy, low salt, carbohydrate modified, low sugar diet Continue to monitor and record fasting and afternoon blood sugars at home daily Continue to monitor and record daily weights at home: you reported a weight this morning of 190 lbs Use home O2 as prescribed-- as needed for episodes of shortness of breath Keep up the great work preventing falls-- continue using your cane and walker as needed Do your best to eat a heart healthy diet that is low in salt, cholesterol, sugar and carbohydrates Stay in touch with the pharmacist at Dr. Gwynn Burly office to make sure your medications are regularly  updated and optimized    Plan: Telephone follow up appointment with care management team member scheduled for:  Tuesday, May 10, 2021 at 3:00 pm The patient has been provided with contact information for the care management team and has been advised to call with any health related questions or concerns  [name], RN, BSN, [place] [phone]: direct office

## 2021-04-15 NOTE — Patient Instructions (Signed)
Visit Information ? ?Mary Mccarthy, thank you for taking time to talk with me today. Please don't hesitate to contact me if I can be of assistance to you before our next scheduled telephone appointment. ? ?Below are the goals we discussed today:  ?Patient Self-Care Activities: ?Patient Mary Mccarthy will: ?Take medications as prescribed   ?Attend all scheduled provider appointments  ?Call pharmacy for medication refills  ?Call provider office for new concerns or questions  ?Continue to follow heart healthy, low salt, carbohydrate modified, low sugar diet ?Continue to monitor and record fasting and afternoon blood sugars at home daily ?Continue to monitor and record daily weights at home: you reported a weight this morning of 190 lbs ?Use home O2 as prescribed-- as needed for episodes of shortness of breath ?Keep up the great work preventing falls-- continue using your cane and walker as needed ?Do your best to eat a heart healthy diet that is low in salt, cholesterol, sugar and carbohydrates ?Stay in touch with the pharmacist at Dr. Raphael Gibney office to make sure your medications are regularly updated and optimized ? ?Our next scheduled telephone follow up visit/ appointment is scheduled on: Tuesday, May 10, 2021 at 3:00 pm- This is a PHONE CALL appointment ? ?If you need to cancel or re-schedule our visit, please call [phone] and our care guide team will be happy to assist you. ?  ?I look forward to hearing about your progress. ?  ?Caryl Pina, RN, BSN, CCRN Alumnus ?CCM Clinic RN Care Coordination- [facility] ?([phone]: direct office ? ?If you are experiencing a Mental Health or Behavioral Health Crisis or need someone to talk to, please  ?call the Suicide and Crisis Lifeline: 988 ?call the [place] National Suicide Prevention Lifeline: (539) 465-2694 or TTY: (930)095-6029 TTY 317-445-7418) to talk to a trained counselor ?call 1-800-273-TALK (toll free, 24 hour hotline) ?go to [facility] [address], [place] [phone]) ?call 911  ? ?The patient verbalized understanding of instructions, educational materials, and care plan provided today and agreed to receive a mailed copy of patient instructions, educational materials, and care plan ? ?Cellulitis, Adult ?Cellulitis is a skin infection. The infected area is often warm, red, swollen, and sore. It occurs most often in the arms and lower legs. It is very important to get treated for this condition. ?What are the causes? ?This condition is caused by bacteria. The bacteria enter through a break in the skin, such as a cut, burn, insect bite, open sore, or crack. ?What increases the risk? ?This condition is more likely to occur in people who: ?Have a weak body defense system (immune system). ?Have open cuts, burns, bites, or scrapes on the skin. ?Are older than 83 years of age. ?Have a blood sugar problem (diabetes). ?Have a long-lasting (chronic) liver disease (cirrhosis) or kidney disease. ?Are very overweight (obese). ?Have a skin problem, such as: ?Itchy rash (eczema). ?Slow movement of blood in the veins (venous stasis). ?Fluid buildup below the skin (edema). ?Have been treated with high-energy rays (radiation). ?Use IV drugs. ?What are the signs or symptoms? ?Symptoms of this condition include: ?Skin that is: ?Red. ?Streaking. ?Spotting. ?Swollen. ?Sore or painful when you touch it. ?Warm. ?A fever. ?Chills. ?Blisters. ?How is this diagnosed? ?This condition is diagnosed based on: ?Medical history. ?Physical exam. ?Blood tests. ?Imaging tests. ?How is this treated? ?Treatment for this condition may include: ?Medicines to treat infections or allergies. ?Home care, such as: ?Rest. ?Placing cold or warm cloths (compresses)  on the skin. ?Hospital care, if the condition is very bad. ?Follow these instructions at home: ?Medicines ?Take over-the-counter and prescription medicines only as told by your doctor. ?If you were  prescribed an antibiotic medicine, take it as told by your doctor. Do not stop taking it even if you start to feel better. ?General instructions ? ?Drink enough fluid to keep your pee (urine) pale yellow. ?Do not touch or rub the infected area. ?Raise (elevate) the infected area above the level of your heart while you are sitting or lying down. ?Place cold or warm cloths on the area as told by your doctor. ?Keep all follow-up visits as told by your doctor. This is important. ?Contact a doctor if: ?You have a fever. ?You do not start to get better after 1-2 days of treatment. ?Your bone or joint under the infected area starts to hurt after the skin has healed. ?Your infection comes back. This can happen in the same area or another area. ?You have a swollen bump in the area. ?You have new symptoms. ?You feel ill and have muscle aches and pains. ?Get help right away if: ?Your symptoms get worse. ?You feel very sleepy. ?You throw up (vomit) or have watery poop (diarrhea) for a long time. ?You see red streaks coming from the area. ?Your red area gets larger. ?Your red area turns dark in color. ?These symptoms may represent a serious problem that is an emergency. Do not wait to see if the symptoms will go away. Get medical help right away. Call your local emergency services (911 in the [place] Do not drive yourself to the hospital. ?Summary ?Cellulitis is a skin infection. The area is often warm, red, swollen, and sore. ?This condition is treated with medicines, rest, and cold and warm cloths. ?Take all medicines only as told by your doctor. ?Tell your doctor if symptoms do not start to get better after 1-2 days of treatment. ?This information is not intended to replace advice given to you by your health care provider. Make sure you discuss any questions you have with your health care provider. ?Document Revised: 06/14/2017 Document Reviewed: 06/14/2017 ?Elsevier Patient Education ? 2022 [employer] ? ? ? ?

## 2021-04-20 ENCOUNTER — Ambulatory Visit (INDEPENDENT_AMBULATORY_CARE_PROVIDER_SITE_OTHER): Payer: Medicare Other | Admitting: Internal Medicine

## 2021-04-20 ENCOUNTER — Other Ambulatory Visit: Payer: Self-pay

## 2021-04-20 ENCOUNTER — Encounter: Payer: Self-pay | Admitting: Internal Medicine

## 2021-04-20 ENCOUNTER — Other Ambulatory Visit: Payer: Self-pay | Admitting: Internal Medicine

## 2021-04-20 VITALS — BP 130/70 | HR 57 | Resp 18 | Ht 61.0 in | Wt 190.4 lb

## 2021-04-20 DIAGNOSIS — Z7901 Long term (current) use of anticoagulants: Secondary | ICD-10-CM | POA: Diagnosis not present

## 2021-04-20 DIAGNOSIS — R7989 Other specified abnormal findings of blood chemistry: Secondary | ICD-10-CM | POA: Diagnosis not present

## 2021-04-20 DIAGNOSIS — E559 Vitamin D deficiency, unspecified: Secondary | ICD-10-CM | POA: Diagnosis not present

## 2021-04-20 DIAGNOSIS — D500 Iron deficiency anemia secondary to blood loss (chronic): Secondary | ICD-10-CM

## 2021-04-20 DIAGNOSIS — F418 Other specified anxiety disorders: Secondary | ICD-10-CM

## 2021-04-20 LAB — BASIC METABOLIC PANEL
BUN: 18 mg/dL (ref 6–23)
CO2: 28 mEq/L (ref 19–32)
Calcium: 9.9 mg/dL (ref 8.4–10.5)
Chloride: 106 mEq/L (ref 96–112)
Creatinine, Ser: 1.38 mg/dL — ABNORMAL HIGH (ref 0.40–1.20)
GFR: 35.64 mL/min — ABNORMAL LOW (ref >60.00)
Glucose, Bld: 102 mg/dL — ABNORMAL HIGH (ref 70–99)
Potassium: 4.5 mEq/L (ref 3.5–5.1)
Sodium: 140 mEq/L (ref 135–145)

## 2021-04-20 LAB — CBC WITH DIFFERENTIAL/PLATELET
Basophils Absolute: 0 10*3/uL (ref 0.0–0.1)
Basophils Relative: 0.4 % (ref 0.0–3.0)
Eosinophils Absolute: 0.1 10*3/uL (ref 0.0–0.7)
Eosinophils Relative: 1.1 % (ref 0.0–5.0)
HCT: 27 % — ABNORMAL LOW (ref 36.0–46.0)
Hemoglobin: 8.3 g/dL — ABNORMAL LOW (ref 12.0–15.0)
Lymphocytes Relative: 27.9 % (ref 12.0–46.0)
Lymphs Abs: 1.8 10*3/uL (ref 0.7–4.0)
MCHC: 30.6 g/dL (ref 30.0–36.0)
MCV: 78.9 fl (ref 78.0–100.0)
Monocytes Absolute: 0.6 10*3/uL (ref 0.1–1.0)
Monocytes Relative: 8.9 % (ref 3.0–12.0)
Neutro Abs: 3.9 10*3/uL (ref 1.4–7.7)
Neutrophils Relative %: 61.7 % (ref 43.0–77.0)
Platelets: 222 10*3/uL (ref 150.0–400.0)
RBC: 3.42 Mil/uL — ABNORMAL LOW (ref 3.87–5.11)
RDW: 20.8 % — ABNORMAL HIGH (ref 11.5–15.5)
WBC: 6.3 10*3/uL (ref 4.0–10.5)

## 2021-04-20 LAB — IBC PANEL
Iron: 21 ug/dL — ABNORMAL LOW (ref 42–145)
Saturation Ratios: 5 % — ABNORMAL LOW (ref 20.0–50.0)
TIBC: 418.6 ug/dL (ref 250.0–450.0)
Transferrin: 299 mg/dL (ref 212.0–360.0)

## 2021-04-20 LAB — FERRITIN: Ferritin: 17.3 ng/mL (ref 10.0–291.0)

## 2021-04-20 MED ORDER — POLYSACCHARIDE IRON COMPLEX 150 MG PO CAPS: 150.0000 mg | ORAL_CAPSULE | Freq: Every day | ORAL | 1 refills | Status: DC

## 2021-04-20 MED ORDER — LEVOTHYROXINE SODIUM 25 MCG PO TABS: 25.0000 ug | ORAL_TABLET | Freq: Every day | ORAL | 3 refills | Status: DC

## 2021-04-20 NOTE — Progress Notes (Signed)
Patient ID: Mary Mccarthy, female   DOB: 07/06/1940, 81 y.o.   MRN: [id number] ? ? ? ?    Chief Complaint: follow up anemia, low vit d, ckd, abnormal tsh, right hand cellulitis ? ?     HPI:  Mary Mccarthy is a 80 y.o. female here with c/o f/u cellultiits right hand mar 9 , seen at UC with tx with antibx, for last dose tomorrow. Has no further redness, swelling, pain or fever,   Has been off eliquis per cardiology after severe anemia requiring 1 u prbc and asked not to restart per cardiology, but consider if another MD asked her to restart.  Pt denies chest pain, increased sob or doe, wheezing, orthopnea, PND, increased LE swelling, palpitations, dizziness or syncope.   Pt denies polydipsia, polyuria, or new focal neuro s/s. No overt bleeding or bruising off the eliquis.  No longer driving as took herself away from this due to risk.  Lives by herself.  Gets very nervous sometimes with occasional sob episodes that suddenly happen with sitting and thinking too much    Good compliance with remeron, has xanax for nervous episodes. Has oxygen concentrater at home  Denies hyper or hypo thyroid symptoms such as voice, skin or hair change.  Not taking Vit D.   ?Wt Readings from Last 3 Encounters:  ?04/20/21 190 lb 6.4 oz (86.4 kg)  ?04/08/21 190 lb 9.6 oz (86.5 kg)  ?03/25/21 190 lb (86.2 kg)  ? ?BP Readings from Last 3 Encounters:  ?04/20/21 130/70  ?04/14/21 (!) 180/74  ?04/08/21 (!) 146/60  ? ?      ?Past Medical History:  ?Diagnosis Date  ? ALLERGIC RHINITIS 06/24/2007  ? Anemia   ? AVM (arteriovenous malformation) of colon   ? Blood transfusion without reported diagnosis 05/2015  ? COLONIC POLYPS, HX OF 06/24/2007  ? Coronary artery disease, non-occlusive 05/2015  ? Trop + w/ Acute Anemia =>CATH: small RI - Ostial 60%, ostial RCA 30% and dLAD 40-50%;; 9/'21: Cor Ca Score 624. Mild (25-49%) prox RCA & LAD,; Moderate (50-69%) Ostial Small RI & prox LCx.    ? DEPRESSION 10/08/2006  ? DIABETES MELLITUS, TYPE II 10/05/2006   ? GERD 06/24/2007  ? History of CVA (cerebrovascular accident) 08/10/2020  ? 08/2020- found to be in afib with RVR, started on Eliquis (shower of emboli from A. fib as complication of severe AS)  ? HYPERLIPIDEMIA 10/08/2006  ? HYPERTENSION 10/08/2006  ? INSOMNIA 09/24/2008  ? Left knee DJD   ? Nonsustained paroxysmal ventricular tachycardia 10/2018  ? Post-TAVR Zio Patch: Frequent (206) runs of NSVT - Fastest 5 beats 239 bpm. Longest 9.3 Sec avg 135 bpm. (? possibly Afib w/ aberrancy)  ? Osteoporosis 03/10/2016  ? PAF (paroxysmal atrial fibrillation) ([place])   ? PAF with RVR: CHA2DS2-VASc 9.  On Eliquis and amiodarone. 08/11/2020  ? PEPTIC ULCER DISEASE 10/08/2006  ? S/P TAVR (transcatheter aortic valve replacement) 10/26/2020  ? s/p TAVR with a 26 mm Medtronic Evolut Pro+ via the TF approach by Dr. Angelena Form & Dr. Cyndia Bent  ? Severe aortic stenosis 08/2020  ? Progression from Mod-Severe AS to Severe AS - noted on Echo 08/2020 -> (mean gradient progressed from 34 to 42 mmHg) this was in setting of new onset A. fib RVR, acute diastolic HF and shower of emboli CVA. ->  Referred for TAVR, completed 10/26/2020  ? ?Past Surgical History:  ?Procedure Laterality Date  ? APPENDECTOMY    ? BIOPSY  10/21/2020  ? Procedure: BIOPSY;  Surgeon: Sharyn Creamer, MD;  Location: [facility] ENDOSCOPY;  Service: Gastroenterology;;  ? BREAST BIOPSY    ? CARDIAC CATHETERIZATION N/A 05/21/2015  ? Procedure: Left Heart Cath and Coronary Angiography;  Surgeon: Leonie Man, MD;  Location: [place] CV LAB;  Service: Cardiovascular;: Ost RI 60%, Ost RCA 30%, dLAD tapers to small vessel w/ 40-50%. Mildly elevated LVEDP. Normal LV Fxn.  ? COLONOSCOPY N/A 05/20/2015  ? Procedure: COLONOSCOPY;  Surgeon: Manus Gunning, MD;  Location: [facility] ENDOSCOPY;  Service: Gastroenterology;  Laterality: N/A;  ? CORONARY CA2+ SCORE / CARDIAC CT ANGIOGRAM  10/09/2019  ? Calcium score 624.  82nd percentile. Dominant RCA: Mild (25-49%) proximal stenosis-distal  bifurcation into PDA and PAV--< RPL branches.  LAD (1 major mid vessel diagonal) diffuse calcified plaque, mild proximal stenosis with minimal distal stenosis.  Small RI moderate ostial disease.  LCx-moderate mixed (50-69%) proximal stenosis.  Small dOM1 disease.  Trileaflet AoV, annular Ca2+ - probable AS  ? ESOPHAGOGASTRODUODENOSCOPY N/A 05/20/2015  ? Procedure: ESOPHAGOGASTRODUODENOSCOPY (EGD);  Surgeon: Manus Gunning, MD;  Location: [place];  Service: Gastroenterology;  Laterality: N/A;  ? ESOPHAGOGASTRODUODENOSCOPY (EGD) WITH PROPOFOL N/A 10/21/2020  ? Procedure: ESOPHAGOGASTRODUODENOSCOPY (EGD) WITH PROPOFOL;  Surgeon: Sharyn Creamer, MD;  Location: [place];  Service: Gastroenterology;  Laterality: N/A;  ? INTRAOPERATIVE TRANSTHORACIC ECHOCARDIOGRAM N/A 10/26/2020  ? Procedure: INTRAOPERATIVE TRANSTHORACIC ECHOCARDIOGRAM;  Surgeon: Angelena Form; Location: [facility] OR; Pre-TAVR well-visualized calcified AoV mean Grad 37 mmHg, AVA 0.72 cm? => Post TAVR well-positioned supra-annular 26 mm Medtronic Evolut Pro valve placed with no PVL.  Mean gradient 14 mmHg.  AVR 1.58 cm?.  Normal flow to RCA and LM-LAD.  ; EF 60 to 65%.  Degenerative severe MAC w/ mild MR.  ? RIGHT/LEFT HEART CATH AND CORONARY ANGIOGRAPHY N/A 09/29/2020  ? Procedure: RIGHT/LEFT HEART CATH AND CORONARY ANGIOGRAPHY;  Surgeon: Burnell Blanks, MD;  Location: [place] CV LAB;  Service: Cardiovascular; pre-TAVR:  pRCA 20%, m-dRCA 30%. D LM-pLAD 20%. RI 30%, mLAD 20%.  Severe AS with mean gradient measured at 52.8 mmHg.  ? TONSILLECTOMY    ? TRANSCATHETER AORTIC VALVE REPLACEMENT, TRANSFEMORAL N/A 10/26/2020  ? Procedure: TRANSCATHETER AORTIC VALVE REPLACEMENT, TRANSFEMORAL;  Surgeon: Burnell Blanks, MD;  Location: [facility] OR;  Service: Open Heart Surgery;; Medtronic Evolut-Pro + (size 26 mm, model # EVPROPLUS -26US, serial # [id number]); transient high-grade AV block noted so PPM left in place.  ? TRANSTHORACIC ECHOCARDIOGRAM   03/17/2019  ? a) 03/2019: EF 60 to 65%.  Moderate LVH.  GRII DD.  Mod-Severe AS (m grad 36 mmHg, peak 59 mmHg); b) 09/2019: EF 65-70%, No RWMA. Gr1 DD/hi LAP, Mild hi PAP. Mod LA Dil. MOD AS (mean Grad 34.5 mmHg).  STABLE  ? TRANSTHORACIC ECHOCARDIOGRAM  08/27/2020  ? Admitted for CVA/Afib RVR & CHF (Progression to SEVERE SYMPTOMATIC AS):  Severe Calcific Aortic Stenosis (VTI AVA estimated 0.86 cm?, mean gradient 42 mmHg, V-max 4.36 m/s).  EF 55 to 60%.  Severe concentric LVH.  Unable to determine diastolic parameters.  Moderately elevated PAP.  Mild LA dilation.  Mild circumferential pericardial effusion.  Trivial MR.  Severe MAC.  ? TRANSTHORACIC ECHOCARDIOGRAM  11/25/2020  ? 1 Month s/p TAVR: EF 55 to 60%.  No R WMA.  GR 1 DD.  Elevated LVEDP.  Normal RV size with mildly elevated RVP estimated 44 mmHg.  Oscillating density in the RV suspect calcified chordal apparatus versus calcified thrombus.  Moderate LA dilation.  Normal IVC/RA P => Well-positioned 26  mm Medtronic Evolut Pro THVwith mean AOV gradient 11 mmHg.  Trivial PVL  ? TRANSTHORACIC ECHOCARDIOGRAM  11/02/2020  ? a) Day 1 Post-op TAVR 10/27/20:  Well-Positioned Supra Annular Medtronic Evolut Pro THP.  Mean gradient 12 mmHg, peak 24 mmHg.  AVA 1.8 cm?.  Trivial PVL.  EF 60 to 65%.  Normal LV function.  Mild LVH.  Severe LA dilation.  Mild RA dilation.  Severe MAC;; b) 11/02/20: Normal structure and function of the aortic valve prosthesis.  Mean gradient 9 mmHg (otherwise stable)  ? TUBAL LIGATION    ? ? reports that she has never smoked. She has never used smokeless tobacco. She reports that she does not drink alcohol and does not use drugs. ?family history includes Alcohol abuse in an other family member; Arrhythmia in her sister; Arthritis in an other family member; CAD in her sister; Cancer in her mother; Depression in an other family member; Diabetes in an other family member; Heart attack (age of onset: 14) in her sister; Heart disease in an other family  member; Hyperlipidemia in an other family member; Hypertension in an other family member; Stroke in an other family member. ?Allergies  ?Allergen Reactions  ? Ibuprofen Other (See Comments)  ?  Bleeding event

## 2021-04-20 NOTE — Patient Instructions (Addendum)
Please take OTC Vitamin D3 at 2000 units per day, indefinitely ? ?Please take all new medication as prescribed - the iron supplement, and the low dose thyroid medication ? ?Please also take the stool softner at least once per day as you take the iron ? ?Please continue all other medications as before, and refills have been done if requested. ? ?Please have the pharmacy call with any other refills you may need ? ?Please keep your appointments with your specialists as you may have planned ? ?Please go to the LAB at the blood drawing area for the tests to be done - just the blood counts and iron today ? ?You will be contacted by phone if any changes need to be made immediately.  Otherwise, you will receive a letter about your results with an explanation, but please check with MyChart first. ? ?Please remember to sign up for MyChart if you have not done so, as this will be important to you in the future with finding out test results, communicating by private email, and scheduling acute appointments online when needed. ? ?Please make an Appointment to return in 1 months, or sooner if needed ?

## 2021-04-20 NOTE — Assessment & Plan Note (Signed)
Last vitamin D ?Lab Results  ?Component Value Date  ? VD25OH 22.16 (L) 02/16/2021  ? ?Low to start oral replacement ? ?

## 2021-04-20 NOTE — Assessment & Plan Note (Signed)
Pt will continu to remain OFF eliquis for now ?

## 2021-04-21 ENCOUNTER — Ambulatory Visit: Payer: Medicare Other

## 2021-04-21 ENCOUNTER — Encounter: Payer: Self-pay | Admitting: Internal Medicine

## 2021-04-21 DIAGNOSIS — I480 Paroxysmal atrial fibrillation: Secondary | ICD-10-CM

## 2021-04-21 DIAGNOSIS — E1165 Type 2 diabetes mellitus with hyperglycemia: Secondary | ICD-10-CM

## 2021-04-21 DIAGNOSIS — I5032 Chronic diastolic (congestive) heart failure: Secondary | ICD-10-CM

## 2021-04-21 DIAGNOSIS — R7989 Other specified abnormal findings of blood chemistry: Secondary | ICD-10-CM

## 2021-04-21 DIAGNOSIS — E785 Hyperlipidemia, unspecified: Secondary | ICD-10-CM

## 2021-04-21 DIAGNOSIS — D500 Iron deficiency anemia secondary to blood loss (chronic): Secondary | ICD-10-CM

## 2021-04-21 NOTE — Assessment & Plan Note (Signed)
Mild to mod uncontrolled, likely due to amiodarone effect, for start levothyroxine 25 mcg qd ,  to f/u any worsening symptoms or concerns, fu lab next visit ?

## 2021-04-21 NOTE — Assessment & Plan Note (Addendum)
Mod to severe recent, for f/u cbc and iron, to restart niferex and stool softner daily, f/u lab next visit as well, to hopefully improved with off eliquis ?

## 2021-04-21 NOTE — Progress Notes (Signed)
? ?Chronic Care Management ?Pharmacy Note ? ?04/21/2021 ?Name:  Mary Mccarthy MRN:  [id number] DOB:  1940-11-25 ? ?Summary: ?-Pt was seen by PCP yesterday, restarted Nu-Iron supplement and was started on levothyroxine as well ?-Patient denies any issues with current medications, reports adherence to current medications  ?-Checking BG at home, averaging in the 140's - highest she could recall was 164 ?-Anticoagulation remains on hold, has follow up with cardiology at the end of the month to discuss  ? ? ?Recommendations/Changes made from today's visit: ?-Recommended no changes to medications at this time - will continue to monitor kidney function as metformin will need to be held should eGFR <30  ?-Patient to reach out to clinic should she have any issues with her new medications  ? ?Subjective: ?Mary Mccarthy is an 81 y.o. year old female who is a primary patient of Jenny Reichmann, Hunt Oris, MD.  The CCM team was consulted for assistance with disease management and care coordination needs.   ? ?Engaged with patient by telephone for follow up visit in response to provider referral for pharmacy case management and/or care coordination services.  ? ?Consent to Services:  ?The patient was given the following information about Chronic Care Management services today, agreed to services, and gave verbal consent: 1. CCM service includes personalized support from designated clinical staff supervised by the primary care provider, including individualized plan of care and coordination with other care providers 2. 24/7 contact phone numbers for assistance for urgent and routine care needs. 3. Service will only be billed when office clinical staff spend 20 minutes or more in a month to coordinate care. 4. Only one practitioner may furnish and bill the service in a calendar month. 5.The patient may stop CCM services at any time (effective at the end of the month) by phone call to the office staff. 6. The patient will be responsible for cost  sharing (co-pay) of up to 20% of the service fee (after annual deductible is met). Patient agreed to services and consent obtained. ? ?Patient Care Team: ?Biagio Borg, MD as PCP - General (Internal Medicine) ?Leonie Man, MD as PCP - Cardiology (Cardiology) ?Knox Royalty, RN as Case Manager ?Foltanski, Cleaster Corin, [facility] as Pharmacist (Pharmacist) ? ?Recent office visits: ?04/20/2021 - Dr. Jenny Reichmann - f/u of cellulitis of right hand / iron deficiency anemia - restart niferex and stool softener  -start levothyroxine for abnormal tsh - f/u in 4 weeks  ? ?Recent consult visits: ?04/08/2021 - Dr. Burt Knack - Cardiology - continue off of anticoagulation - concern about watchman implant, decided against procedure - in sinus rhythm on amiodarone - no changes to medications at this time  ? ?Hospital visits: ?04/14/2021 - ED visit for hand pain - cellulitis - augmentin rx'd  ? ?Objective: ? ?Lab Results  ?Component Value Date  ? CREATININE 1.38 (H) 04/20/2021  ? BUN 18 04/20/2021  ? GFR 35.64 (L) 04/20/2021  ? GFRNONAA 56 (L) 11/03/2020  ? GFRAA 57 (L) 10/01/2019  ? NA 140 04/20/2021  ? K 4.5 04/20/2021  ? CALCIUM 9.9 04/20/2021  ? CO2 28 04/20/2021  ? GLUCOSE 102 (H) 04/20/2021  ? ? ?Lab Results  ?Component Value Date/Time  ? HGBA1C 7.5 (H) 02/16/2021 02:55 PM  ? HGBA1C 5.8 (H) 10/26/2020 03:42 AM  ? GFR 35.64 (L) 04/20/2021 01:52 PM  ? GFR 40.96 (L) 02/16/2021 02:55 PM  ? MICROALBUR <0.7 02/16/2021 02:55 PM  ? MICROALBUR 1.8 03/18/2020 11:25 AM  ?  ?Last diabetic Eye  exam:  ?Lab Results  ?Component Value Date/Time  ? HMDIABEYEEXA No Retinopathy 07/16/2018 12:00 AM  ?  ?Last diabetic Foot exam: No results found for: HMDIABFOOTEX  ? ?Lab Results  ?Component Value Date  ? CHOL 92 02/16/2021  ? HDL 34.60 (L) 02/16/2021  ? [place] 40 02/16/2021  ? TRIG 88.0 02/16/2021  ? CHOLHDL 3 02/16/2021  ? ? ?Hepatic Function Latest Ref Rng & Units 02/16/2021 02/02/2021 01/05/2021  ?Total Protein 6.0 - 8.3 g/dL 7.2 6.6 7.0  ?Albumin 3.5 - 5.2 g/dL  3.9 3.9 4.2  ?AST 0 - 37 U/L $Remo'18 20 17  'DOmRt$ ?ALT 0 - 35 U/L $Remo'12 15 10  'ChAiu$ ?Alk Phosphatase 39 - 117 U/L 43 60 52  ?Total Bilirubin 0.2 - 1.2 mg/dL 0.6 0.5 0.4  ?Bilirubin, Direct 0.0 - 0.3 mg/dL 0.1 - -  ? ? ?Lab Results  ?Component Value Date/Time  ? TSH 7.54 (H) 02/16/2021 02:55 PM  ? TSH 5.820 (H) 11/11/2020 02:57 PM  ? ? ?CBC Latest Ref Rng & Units 04/20/2021 04/07/2021 03/31/2021  ?WBC 4.0 - 10.5 K/uL 6.3 - 5.0  ?Hemoglobin 12.0 - 15.0 g/dL 8.3 Repeated and verified X2.(L) 7.8(L) 7.6(L)  ?Hematocrit 36.0 - 46.0 % 27.0(L) 25.2(L) 25.8(L)  ?Platelets 150.0 - 400.0 K/uL 222.0 - 252  ? ? ?Lab Results  ?Component Value Date/Time  ? VD25OH 22.16 (L) 02/16/2021 02:55 PM  ? VD25OH 14.34 (L) 03/18/2020 11:25 AM  ? ? ?Clinical ASCVD: Yes  ?The ASCVD Risk score (Arnett DK, et al., 2019) failed to calculate for the following reasons: ?  The 2019 ASCVD risk score is only valid for ages 15 to 79 ?  The patient has a prior MI or stroke diagnosis   ? ?Depression screen [facility] 2/9 04/20/2021 02/16/2021 12/01/2020  ?Decreased Interest 0 0 0  ?Down, Depressed, Hopeless 0 1 1  ?PHQ - 2 Score 0 1 1  ?Altered sleeping 0 - -  ?Tired, decreased energy 0 - -  ?Change in appetite 0 - -  ?Feeling bad or failure about yourself  0 - -  ?Trouble concentrating 0 - -  ?Moving slowly or fidgety/restless 0 - -  ?Suicidal thoughts 0 - -  ?PHQ-9 Score 0 - -  ?Difficult doing work/chores - - -  ?Some recent data might be hidden  ?  ? ?CHA2DS2-VASc Score = 8  ?The patient's score is based upon: ?CHF History: 0 ?HTN History: 1 ?Diabetes History: 1 ?Stroke History: 2 ?Vascular Disease History: 1 ?Age Score: 2 ?Gender Score: 1 ? ?    ? ? ?Social History  ? ?Tobacco Use  ?Smoking Status Never  ?Smokeless Tobacco Never  ? ?BP Readings from Last 3 Encounters:  ?04/20/21 130/70  ?04/14/21 (!) 180/74  ?04/08/21 (!) 146/60  ? ?Pulse Readings from Last 3 Encounters:  ?04/20/21 (!) 57  ?04/14/21 62  ?04/08/21 (!) 56  ? ?Wt Readings from Last 3 Encounters:  ?04/20/21 190 lb 6.4  oz (86.4 kg)  ?04/08/21 190 lb 9.6 oz (86.5 kg)  ?03/25/21 190 lb (86.2 kg)  ? ?BMI Readings from Last 3 Encounters:  ?04/20/21 35.98 kg/m?  ?04/08/21 36.01 kg/m?  ?03/25/21 35.90 kg/m?  ? ? ?Assessment/Interventions: Review of patient past medical history, allergies, medications, health status, including review of consultants reports, laboratory and other test data, was performed as part of comprehensive evaluation and provision of chronic care management services.  ? ?SDOH:  (Social Determinants of Health) assessments and interventions performed: Yes ? ?SDOH Screenings  ? ?Alcohol  Screen: Not on file  ?Depression (PHQ2-9): Low Risk   ? PHQ-2 Score: 0  ?Financial Resource Strain: Medium Risk  ? Difficulty of Paying Living Expenses: Somewhat hard  ?Food Insecurity: No Food Insecurity  ? Worried About Charity fundraiser in the Last Year: Never true  ? Ran Out of Food in the Last Year: Never true  ?Housing: Low Risk   ? Last Housing Risk Score: 0  ?Physical Activity: Not on file  ?Social Connections: Not on file  ?Stress: Stress Concern Present  ? Feeling of Stress : Rather much  ?Tobacco Use: Low Risk   ? Smoking Tobacco Use: Never  ? Smokeless Tobacco Use: Never  ? Passive Exposure: Not on file  ?Transportation Needs: No Transportation Needs  ? Lack of Transportation (Medical): No  ? Lack of Transportation (Non-Medical): No  ? ? ?CCM Care Plan ? ?Allergies  ?Allergen Reactions  ? Ibuprofen Other (See Comments)  ?  Bleeding events  ? ? ?Medications Reviewed Today   ? ? Reviewed by Biagio Borg, MD (Physician) on 04/21/21 at 0457  Med List Status: <None>  ? ?Medication Order Taking? Sig Documenting Provider Last Dose Status Informant  ?acetaminophen (TYLENOL) 500 MG tablet [id number] Yes Take 500 mg by mouth every 6 (six) hours as needed for moderate pain. [provider] Taking Active Self  ?ALPRAZolam (XANAX) 0.25 MG tablet [id number] Yes TAKE 1 TABLET BY MOUTH THREE TIMES A DAY AS NEEDED FOR ANXIETY Biagio Borg, MD Taking Active   ?amiodarone (PACERONE) 200 MG tablet [id number] Yes Take 1 tablet (200 mg total) by mouth daily. Eileen Stanford, PA-C Taking Active Self  ?amLODipine (NORVASC) 10 MG

## 2021-04-21 NOTE — Patient Instructions (Signed)
Visit Information ? ?Following are the goals we discussed today:  ? ?Manage My Medicine  ? ?Timeframe:  Long-Range Goal ?Priority:  High ?Start Date:       11/22/20                      ?Expected End Date:    04/22/2022                 ? ?Follow Up Date May 2023 ?  ?- call for medicine refill 2 or 3 days before it runs out ?- call if I am sick and can't take my medicine ?- keep a list of all the medicines I take; vitamins and herbals too ?- use a pillbox to sort medicine  ?-weigh daily, and contact provider if weight gain of 5+ lbs in a week, contact cardiology ?  ?Why is this important?   ?These steps will help you keep on track with your medicines. ? ?Plan: Telephone follow up appointment with care management team member scheduled for:  2 months  ?The patient has been provided with contact information for the care management team and has been advised to call with any health related questions or concerns.  ? ?Tomasa Blase, PharmD ?Clinical Pharmacist, [place]  ? ?Please call the care guide team at [phone] if you need to cancel or reschedule your appointment.  ? ?The patient verbalized understanding of instructions, educational materials, and care plan provided today and declined offer to receive copy of patient instructions, educational materials, and care plan.  ? ?

## 2021-04-21 NOTE — Assessment & Plan Note (Signed)
With at least mild to mod persistent symptoms but overall stable, decliens change in tx or need for referral at this time ?

## 2021-04-24 DIAGNOSIS — I509 Heart failure, unspecified: Secondary | ICD-10-CM | POA: Diagnosis not present

## 2021-04-26 ENCOUNTER — Other Ambulatory Visit: Payer: Self-pay | Admitting: Physician Assistant

## 2021-05-02 ENCOUNTER — Ambulatory Visit: Payer: Medicare Other | Admitting: Cardiology

## 2021-05-02 ENCOUNTER — Other Ambulatory Visit: Payer: Self-pay

## 2021-05-02 ENCOUNTER — Encounter: Payer: Self-pay | Admitting: Cardiology

## 2021-05-02 VITALS — BP 162/62 | HR 57 | Ht 61.0 in | Wt 185.0 lb

## 2021-05-02 DIAGNOSIS — I248 Other forms of acute ischemic heart disease: Secondary | ICD-10-CM | POA: Diagnosis not present

## 2021-05-02 DIAGNOSIS — D500 Iron deficiency anemia secondary to blood loss (chronic): Secondary | ICD-10-CM

## 2021-05-02 DIAGNOSIS — I442 Atrioventricular block, complete: Secondary | ICD-10-CM

## 2021-05-02 DIAGNOSIS — I272 Pulmonary hypertension, unspecified: Secondary | ICD-10-CM | POA: Diagnosis not present

## 2021-05-02 DIAGNOSIS — E1165 Type 2 diabetes mellitus with hyperglycemia: Secondary | ICD-10-CM

## 2021-05-02 DIAGNOSIS — I10 Essential (primary) hypertension: Secondary | ICD-10-CM | POA: Diagnosis not present

## 2021-05-02 DIAGNOSIS — E785 Hyperlipidemia, unspecified: Secondary | ICD-10-CM

## 2021-05-02 DIAGNOSIS — J9611 Chronic respiratory failure with hypoxia: Secondary | ICD-10-CM

## 2021-05-02 DIAGNOSIS — I2510 Atherosclerotic heart disease of native coronary artery without angina pectoris: Secondary | ICD-10-CM

## 2021-05-02 DIAGNOSIS — D6869 Other thrombophilia: Secondary | ICD-10-CM | POA: Diagnosis not present

## 2021-05-02 DIAGNOSIS — I5032 Chronic diastolic (congestive) heart failure: Secondary | ICD-10-CM | POA: Diagnosis not present

## 2021-05-02 DIAGNOSIS — Z79899 Other long term (current) drug therapy: Secondary | ICD-10-CM

## 2021-05-02 DIAGNOSIS — Z8673 Personal history of transient ischemic attack (TIA), and cerebral infarction without residual deficits: Secondary | ICD-10-CM | POA: Diagnosis not present

## 2021-05-02 DIAGNOSIS — Z952 Presence of prosthetic heart valve: Secondary | ICD-10-CM | POA: Diagnosis not present

## 2021-05-02 DIAGNOSIS — I2489 Other forms of acute ischemic heart disease: Secondary | ICD-10-CM

## 2021-05-02 DIAGNOSIS — I480 Paroxysmal atrial fibrillation: Secondary | ICD-10-CM | POA: Diagnosis not present

## 2021-05-02 MED ORDER — LOSARTAN POTASSIUM 50 MG PO TABS: 50.0000 mg | ORAL_TABLET | Freq: Every day | ORAL | 3 refills | Status: DC

## 2021-05-02 NOTE — Progress Notes (Signed)
? ? ?Primary Care Provider: Biagio Borg, MD ?Cardiologist: Glenetta Hew, MD ?Electrophysiologist: Dr. Sallyanne Kuster ?TAVR/Structural Heart Team: Dr. Angelena Form, (Dr. Burt Knack) Dr. Cyndia Bent;  ? ?Clinic Note: ?Chief Complaint  ?Patient presents with  ? Follow-up  ?  5 months for me, but 3 weeks from last cardiology visit  ? Atrial Fibrillation  ?  No longer on DOAC.  Watchman procedure canceled  ? Cardiac Valve Problem  ?  Status post TAVR  ? ? ?=================================== ? ?ASSESSMENT/PLAN  ? ?Problem List Items Addressed This Visit   ? ?  ? Cardiology Problems  ? Hyperlipidemia with target LDL less than 70 (Chronic)  ? Relevant Medications  ? losartan (COZAAR) 50 MG tablet  ? Essential hypertension (Chronic)  ?  Blood pressure high today.  She has been gradually building up with her previous medications.  We will reduce. ? ?Plan: ?Increase losartan to 50 mg daily and continue amlodipine 10 mg. ?Continue low-dose Toprol 12.5 mg. ?Continue furosemide standing dose PRN for additional weight gain or edema. ?  ?  ? Relevant Medications  ? losartan (COZAAR) 50 MG tablet  ? Other Relevant Orders  ? EKG 12-Lead (Completed)  ? C-reactive protein  ? Sedimentation rate  ? Pulmonary hypertension (HCC) (Chronic)  ?  Only mildly elevated on most recent echo.  Notably improved after TAVR. ? ?Continue to treat systemic hypertension.  She does have some edema and is on standing dose of furosemide. ? ?Plan:  ?Increase losartan to 50 mg ?Continue furosemide; low threshold to take additional dose. ?Support stockings: Recommend starting with 15-20 mmHg with low threshold to increase to 20-30 mmHg ?  ?  ? Relevant Medications  ? losartan (COZAAR) 50 MG tablet  ? Demand ischemia of myocardium (HCC) (Chronic)  ?  She had type II MI in the setting of severe anemia and aortic stenosis in the past.  EF still preserved.  No further angina symptoms. ? ?Seen to be tolerating reduced hemoglobin levels post TAVR. ?  ?  ? Relevant Medications  ?  losartan (COZAAR) 50 MG tablet  ? Other Relevant Orders  ? EKG 12-Lead (Completed)  ? C-reactive protein  ? Sedimentation rate  ? PAF with RVR: CHA2DS2-VASc 9.  On Eliquis and amiodarone. - Primary (Chronic)  ?  Has not had any breakthrough episodes of A-fib that I can tell since the initiation of amiodarone. ? ?CHA2DS2-VASc score 8-9, HAS BLED score 4 -> as we discussed, with recent recurrent GI bleeds, too high risk for anticoagulation. ? ?Plan: ?Stop DOAC.  No plan to restart ?Continue amiodarone 200 mg daily ?Continue Toprol at 12.5 mg daily ?  ?  ? Relevant Medications  ? losartan (COZAAR) 50 MG tablet  ? Other Relevant Orders  ? EKG 12-Lead (Completed)  ? C-reactive protein  ? Sedimentation rate  ? ECHOCARDIOGRAM COMPLETE  ? CHF (congestive heart failure), NYHA class II, chronic, diastolic (HCC) (Chronic)  ?  Hard to tell if her symptoms are related to CHF or anemia.  At least class II CHF.   ?No real PND orthopnea but does have edema. ? ?Plan: ?Increase losartan to 50 mg daily for additional afterload reduction. ?On low-dose Toprol along with amiodarone. ?On same dose of amlodipine for additional blood pressure control ?Non-STEMI dose of furosemide.  Discussed sliding scale for increased weight gain or edema. ?Start with low weight support stockings, gradually increase ?  ?  ? Relevant Medications  ? losartan (COZAAR) 50 MG tablet  ? Other Relevant Orders  ?  EKG 12-Lead (Completed)  ? C-reactive protein  ? Sedimentation rate  ? Coronary artery disease, non-occlusive (Chronic)  ?  Mild diffuse disease with elevated Coronary Calcium Score.  No active angina.  Tolerating low hemoglobin level. ? ?No aspirin because of bleeding issues as noted. ?On low-dose Toprol along with amiodarone.  Cannot titrate further because of bradycardia. ?On amlodipine ?On atorvastatin with well-controlled lipids. ?  ?  ? Relevant Medications  ? losartan (COZAAR) 50 MG tablet  ? Other Relevant Orders  ? EKG 12-Lead (Completed)  ?  C-reactive protein  ? Sedimentation rate  ? ECHOCARDIOGRAM COMPLETE  ? Transient post TAVR CHB -> not seen on follow-up Zio patch (Chronic)  ?  Nothing to suggest further block.  Continue to monitor. ?  ?  ? Relevant Medications  ? losartan (COZAAR) 50 MG tablet  ?  ? Other  ? Anemia, iron deficiency (Chronic)  ?  Follow-up with PCP.  Suspect that she may benefit from iron infusion.  She is on oral iron and does not tolerate it very well with some nausea and upset stomach. ? ?Perhaps she would benefit from [facility]. ?  ?  ? History of CVA (cerebrovascular accident) (Chronic)  ? Chronic respiratory failure (HCC) (Chronic)  ?  Since doing better overall, but still has nighttime desaturations.  Has home oxygen which she does use intermittently at nighttime.  But she would like to have a portable oxygen concentrator to take with her in case he has a spell of dyspnea when she is not at home. ?. ?Recommend that she discuss with PCP help to change prescription to portable oxygen concentrator. ?  ?  ? S/P TAVR (transcatheter aortic valve replacement) (Chronic)  ?  Has been doing okay since her TAVR.  Less of the anxiety issues. ?NYHA class II symptoms but more limited by her anemia. ?Most recent echo showed normal functioning valve.  Mean gradient 12 mmHg.  Due for follow-up echo in October. ? ?Discussed SBE prophylaxis. ?  ?  ? Relevant Orders  ? EKG 12-Lead (Completed)  ? C-reactive protein  ? Sedimentation rate  ? ECHOCARDIOGRAM COMPLETE  ? Hypercoagulable state due to atrial fibrillation (HCC) (Chronic)  ?  As noted.  Felt to be too high risk to continue anticoagulation.  Not favorable candidate for Watchman because she could not take anticoagulation for even 6 months post Watchman. ? ?We will continue now without DOAC.  Trying to maintain sinus rhythm with amiodarone. ?  ?  ? Relevant Orders  ? EKG 12-Lead (Completed)  ? C-reactive protein  ? Sedimentation rate  ? Long term current use of amiodarone for rate-rhythm  control of A. fib (Chronic)  ? Relevant Orders  ? EKG 12-Lead (Completed)  ? C-reactive protein  ? Sedimentation rate  ? ? ?=================================== ? ?HPI:   ? ?Mary Mccarthy is a 81 y.o. female with a relatively complicated PMH reviewed who presents today for 18-month follow-up. ? ?SEVERE AORTIC STENOSIS (progression of disease in 2021-2022) -> declared herself as Symptomatic Severe AS =  ?July 2022: Presented with CHF/ NEW ONSET AFIB & CVA. ?TAVR delayed until Afib treated & CVA stable; only to develop CHF ?Aug 2022 - admitted with Acute on Chronic CHF ?Amiodarone for Afib rhythm control; Eliquis ?10/19/2020 -ACUTE BLOOD LOSS ANEMIA,GI bleed (hemoglobin 7.6 = thought to be related to NSAIDs coupled with Eliquis).  Transfused.  CHF meds adjusted.  (Bleeding thought to be related to AVMs) ? R&LHC (09/29/2020): Mild diffuse multivessel CAD 20  to 30%.  AoV MG measured 53 mm. ?TAVR (10/26/2020): 26 mm Medtronic Evolut-Pro +.transient High-Grade AVB ?Significant post-op anxiety issues ?Zio PATCH: NO High Grade AVB noted. Afib burden ~16% (rate range 62-139 bpm), Multiple bursts of NSVT (5-20 beats) ?1 Month Echo: EF 55-60%. Mildly elevated RVP.TAVR valve well seated . MG 11 mmHg, trivial PVL. ?PULMONARY HTN ?CHRONIC HFpEF ? ?I last saw Mary Mccarthy on November 29, 2020 for follow-up.  She was doing fine for cardiac tamponade but was just having lots of panic attacks where her heart rate will go up and she feels short of breath.  Could potentially have been short bursts of A-fib as well.  She was taking PRN Xanax and was started on Remeron but she stopped due to headaches.  No exertional dyspnea.  Off-and-on partial edema for which she is taking Lasix couple times.  Some mild baseline dyspnea, but notably improved post TAVR.  Using a cane for balance IVUS the main residual effect from her stroke.  Marland Kitchen ?Maintenance labs for amiodarone check.  Maintaining sinus rhythm with amiodarone; continued on DOAC=> plan was  to delay PFTs until she was stable postop. ?Plan was to increase losartan to 50 mg daily. ? ?Recent Hospitalizations:  ?[facility], [place] 04/14/2021: Swelling and redness of the right hand and arm.

## 2021-05-02 NOTE — Patient Instructions (Signed)
Medication Instructions:  ? Increase Losartan  50 mg  daily  ?*If you need a refill on your cardiac medications before your next appointment, please call your pharmacy* ? ? ?Lab Work: ?In April  - please have primary  CRP, ESR done  ? ?If you have labs (blood work) drawn today and your tests are completely normal, you will receive your results only by: ?MyChart Message (if you have MyChart) OR ?A paper copy in the mail ?If you have any lab test that is abnormal or we need to change your treatment, we will call you to review the results. ? ? ?Testing/Procedures: ? Will be schedule at echo at [address]- Oct. 2023 ?Your physician has requested that you have an echocardiogram. Echocardiography is a painless test that uses sound waves to create images of your heart. It provides your doctor with information about the size and shape of your heart and how well your heart?s chambers and valves are working. This procedure takes approximately one hour. There are no restrictions for this procedure. ? ? ? ?Follow-Up: ?At [facility], you and your health needs are our priority.  As part of our continuing mission to provide you with exceptional heart care, we have created designated Provider Care Teams.  These Care Teams include your primary Cardiologist (physician) and Advanced Practice Providers (APPs -  Physician Assistants and Nurse Practitioners) who all work together to provide you with the care you need, when you need it. ? ?We recommend signing up for the patient portal called "MyChart".  Sign up information is provided on this After Visit Summary.  MyChart is used to connect with patients for Virtual Visits (Telemedicine).  Patients are able to view lab/test results, encounter notes, upcoming appointments, etc.  Non-urgent messages can be sent to your provider as well.   ?To learn more about what you can do with MyChart, go to [email or web address].   ? ?Your next appointment:   ?8 month(s) NOV 2023 ? ?The  format for your next appointment:   ?In Person ? ?Provider:   ?Glenetta Hew, MD   ? ? ?Other Instructions ? recommends you purchase some compression  socks/hose from [employer] in [place] ,[place]. You do not need an prescription to purchase the items. ? Address  [address] ?     [place], [place] [address] ? ?Phone  [phone] ?  ?Compression   strength   x  8-15 mmHg 15-20 mmHg ?                 ?         20-30 mmHg  30-40 mmHg. ? ?You may also try a medical supply store, department store (i.e.- [place], [employer], [place], specialty shoe stores ( shoe market), [employer] and [employer]) or  Chartered loss adjuster uniform store.  ?

## 2021-05-04 ENCOUNTER — Telehealth: Payer: Self-pay | Admitting: Internal Medicine

## 2021-05-04 ENCOUNTER — Telehealth: Payer: Self-pay | Admitting: Cardiology

## 2021-05-04 NOTE — Telephone Encounter (Signed)
Pt c/o medication issue: ? ?1. Name of Medication:  ? pantoprazole (PROTONIX) 40 MG tablet  ? ?2. How are you currently taking this medication (dosage and times per day)? Pt only took 1 tablet last night ? ?3. Are you having a reaction (difficulty breathing--STAT)? No ? ?4. What is your medication issue? Pt  states Dr. Herbie Baltimore advised her to take this medication at night at  her last appt. But she takes 2 tablets. She is wanting to know if she should take 2 tablets at night because her prescription bottle states she should not take more than 2 tablets within 4hrs of each other. She only took 1 tabet last night instead of 2 tablets due to this. Please advise ?

## 2021-05-04 NOTE — Telephone Encounter (Signed)
I spoke with the pt and she plans to take her Protonix at 12 noon and 10 pm to avoid taking it after her thyroid med. She will call if she has any further questions.  ?

## 2021-05-05 ENCOUNTER — Telehealth: Payer: Self-pay | Admitting: Internal Medicine

## 2021-05-05 NOTE — Telephone Encounter (Signed)
Left message for patient to call back to schedule Medicare Annual Wellness Visit  ? ?Last AWV  09/07/16 ? ?Please schedule at anytime with [facility] Advisor if patient calls the office back.   ? ? ?Any questions, please call me at [phone]  ?

## 2021-05-06 DIAGNOSIS — E785 Hyperlipidemia, unspecified: Secondary | ICD-10-CM | POA: Diagnosis not present

## 2021-05-06 DIAGNOSIS — I5032 Chronic diastolic (congestive) heart failure: Secondary | ICD-10-CM

## 2021-05-06 DIAGNOSIS — I480 Paroxysmal atrial fibrillation: Secondary | ICD-10-CM | POA: Diagnosis not present

## 2021-05-06 DIAGNOSIS — E1165 Type 2 diabetes mellitus with hyperglycemia: Secondary | ICD-10-CM

## 2021-05-06 DIAGNOSIS — D500 Iron deficiency anemia secondary to blood loss (chronic): Secondary | ICD-10-CM

## 2021-05-07 ENCOUNTER — Other Ambulatory Visit: Payer: Self-pay | Admitting: Internal Medicine

## 2021-05-07 NOTE — Telephone Encounter (Signed)
Please refill as per office routine med refill policy (all routine meds to be refilled for 3 mo or monthly (per pt preference) up to one year from last visit, then month to month grace period for 3 mo, then further med refills will have to be denied) ? ?

## 2021-05-08 ENCOUNTER — Encounter: Payer: Self-pay | Admitting: Cardiology

## 2021-05-08 NOTE — Assessment & Plan Note (Signed)
Mild diffuse disease with elevated Coronary Calcium Score.  No active angina.  Tolerating low hemoglobin level. ? ?? No aspirin because of bleeding issues as noted. ?? On low-dose Toprol along with amiodarone.  Cannot titrate further because of bradycardia. ?? On amlodipine ?? On atorvastatin with well-controlled lipids. ?

## 2021-05-08 NOTE — Assessment & Plan Note (Signed)
Only mildly elevated on most recent echo.  Notably improved after TAVR. ? ?Continue to treat systemic hypertension.  She does have some edema and is on standing dose of furosemide. ? ?Plan:  ?? Increase losartan to 50 mg ?? Continue furosemide; low threshold to take additional dose. ?? Support stockings: Recommend starting with 15-20 mmHg with low threshold to increase to 20-30 mmHg ?

## 2021-05-08 NOTE — Assessment & Plan Note (Signed)
She had type II MI in the setting of severe anemia and aortic stenosis in the past.  EF still preserved.  No further angina symptoms. ? ?Seen to be tolerating reduced hemoglobin levels post TAVR. ?

## 2021-05-08 NOTE — Assessment & Plan Note (Signed)
Has not had any breakthrough episodes of A-fib that I can tell since the initiation of amiodarone. ? ?CHA2DS2-VASc score 8-9, HAS BLED score 4 -> as we discussed, with recent recurrent GI bleeds, too high risk for anticoagulation. ? ?Plan: ?? Stop DOAC.  No plan to restart ?? Continue amiodarone 200 mg daily ?? Continue Toprol at 12.5 mg daily ?

## 2021-05-08 NOTE — Assessment & Plan Note (Signed)
Since doing better overall, but still has nighttime desaturations.  Has home oxygen which she does use intermittently at nighttime.  But she would like to have a portable oxygen concentrator to take with her in case he has a spell of dyspnea when she is not at home. ?. ?Recommend that she discuss with PCP help to change prescription to portable oxygen concentrator. ?

## 2021-05-08 NOTE — Assessment & Plan Note (Signed)
As noted.  Felt to be too high risk to continue anticoagulation.  Not favorable candidate for Watchman because she could not take anticoagulation for even 6 months post Watchman. ? ?We will continue now without DOAC.  Trying to maintain sinus rhythm with amiodarone. ?

## 2021-05-08 NOTE — Assessment & Plan Note (Signed)
Nothing to suggest further block.  Continue to monitor. ?

## 2021-05-08 NOTE — Assessment & Plan Note (Signed)
Hard to tell if her symptoms are related to CHF or anemia.  At least class II CHF.   ?No real PND orthopnea but does have edema. ? ?Plan: ?? Increase losartan to 50 mg daily for additional afterload reduction. ?? On low-dose Toprol along with amiodarone. ?? On same dose of amlodipine for additional blood pressure control ?? Non-STEMI dose of furosemide.  Discussed sliding scale for increased weight gain or edema. ?? Start with low weight support stockings, gradually increase ?

## 2021-05-08 NOTE — Assessment & Plan Note (Signed)
Has been doing okay since her TAVR.  Less of the anxiety issues. ?NYHA class II symptoms but more limited by her anemia. ?Most recent echo showed normal functioning valve.  Mean gradient 12 mmHg.  Due for follow-up echo in October. ? ?Discussed SBE prophylaxis. ?

## 2021-05-08 NOTE — Assessment & Plan Note (Signed)
Follow-up with PCP.  Suspect that she may benefit from iron infusion.  She is on oral iron and does not tolerate it very well with some nausea and upset stomach. ? ?Perhaps she would benefit from [facility]. ?

## 2021-05-08 NOTE — Assessment & Plan Note (Signed)
Blood pressure high today.  She has been gradually building up with her previous medications.  We will reduce. ? ?Plan: ?? Increase losartan to 50 mg daily and continue amlodipine 10 mg. ?? Continue low-dose Toprol 12.5 mg. ?? Continue furosemide standing dose PRN for additional weight gain or edema. ?

## 2021-05-09 ENCOUNTER — Telehealth: Payer: Self-pay

## 2021-05-09 ENCOUNTER — Ambulatory Visit (INDEPENDENT_AMBULATORY_CARE_PROVIDER_SITE_OTHER): Payer: Medicare Other

## 2021-05-09 DIAGNOSIS — Z0000 Encounter for general adult medical examination without abnormal findings: Secondary | ICD-10-CM | POA: Diagnosis not present

## 2021-05-09 NOTE — Progress Notes (Signed)
?I connected with Mary Mccarthy today by telephone and verified that I am speaking with the correct person using two identifiers. ?Location patient: home ?Location provider: work ?Persons participating in the virtual visit: patient, provider. ?  ?I discussed the limitations, risks, security and privacy concerns of performing an evaluation and management service by telephone and the availability of in person appointments. I also discussed with the patient that there may be a patient responsible charge related to this service. The patient expressed understanding and verbally consented to this telephonic visit.  ?  ?Interactive audio and video telecommunications were attempted between this provider and patient, however failed, due to patient having technical difficulties OR patient did not have access to video capability.  We continued and completed visit with audio only. ? ?Some vital signs may be absent or patient reported.  ? ?Time Spent with patient on telephone encounter: 30 minutes ? ?Subjective:  ? Mary Mccarthy is a 81 y.o. female who presents for Medicare Annual (Subsequent) preventive examination. ? ?Review of Systems    ? ?Cardiac Risk Factors include: advanced age (>60mn, >>48women);diabetes mellitus;dyslipidemia;hypertension;obesity (BMI >30kg/m2);family history of premature cardiovascular disease ? ?   ?Objective:  ?  ?Today's Vitals  ? 05/09/21 1301  ?PainSc: 0-No pain  ? ?There is no height or weight on file to calculate BMI. ? ? ?  05/09/2021  ?  1:09 PM 11/01/2020  ?  7:05 PM 10/24/2020  ?  9:30 PM 10/18/2020  ?  5:38 PM 09/07/2016  ? 10:18 AM 05/17/2015  ?  8:52 PM  ?Advanced Directives  ?Does Patient Have a Medical Advance Directive? _0  No  ?Would patient like information on creating a medical advance directive? No - Patient declined No - Patient declined No - Patient declined  Yes (ED - Information included in AVS) No - patient declined information  ? ? ?Current Medications  (verified) ?Outpatient Encounter Medications as of 05/09/2021  ?Medication Sig  ? acetaminophen (TYLENOL) 500 MG tablet Take 500 mg by mouth every 6 (six) hours as needed for moderate pain.  ? ALPRAZolam (XANAX) 0.25 MG tablet TAKE 1 TABLET BY MOUTH THREE TIMES A DAY AS NEEDED FOR ANXIETY  ? amiodarone (PACERONE) 200 MG tablet Take 1 tablet (200 mg total) by mouth daily.  ? amLODipine (NORVASC) 10 MG tablet Take 1 tablet (10 mg total) by mouth daily.  ? atorvastatin (LIPITOR) 40 MG tablet TAKE 1 TABLET BY MOUTH EVERY DAY  ? blood glucose meter kit and supplies Dispense based on patient and insurance preference. Use up to four times daily as directed. E11.9  ? Blood Glucose Monitoring Suppl (ONE TOUCH ULTRA 2) w/Device KIT Use as directed daily  ? citalopram (CELEXA) 10 MG tablet Take 1 tablet (10 mg total) by mouth daily.  ? cyanocobalamin ([employer] VITAMIN B12) 1000 MCG tablet TAKE 1 TABLET BY MOUTH EVERY DAY  ? furosemide (LASIX) 40 MG tablet Take 1 tablet (40 mg total) by mouth daily.  ? Lancets (ONETOUCH DELICA PLUS [id number] MISC USE AS DIRECTED TEST 1-2 TIMES A DAY  ? levothyroxine (SYNTHROID) 25 MCG tablet Take 1 tablet (25 mcg total) by mouth daily.  ? losartan (COZAAR) 50 MG tablet Take 1 tablet (50 mg total) by mouth daily.  ? metFORMIN (GLUCOPHAGE) 1000 MG tablet TAKE 1 TAB BY MOUTH IN THE AM,AND 1/2 TAB IN THE PM  ? metoprolol succinate (TOPROL-XL) 25 MG 24 hr tablet Take 0.5 tablets (12.5 mg total) by mouth daily.  ?  mirtazapine (REMERON) 15 MG tablet Take 15 mg by mouth at bedtime.  ? ONETOUCH ULTRA test strip USE AS INSTRUCTED TEST 1-2 TIMES A DAY  ? pantoprazole (PROTONIX) 40 MG tablet TAKE 1 TABLET BY MOUTH TWICE A DAY  ? potassium chloride SA (KLOR-CON M) 20 MEQ tablet Take 1 tablet (20 mEq total) by mouth daily.  ? iron polysaccharides (NU-IRON) 150 MG capsule Take 1 capsule (150 mg total) by mouth daily. (Patient not taking: Reported on 05/09/2021)  ? ?No facility-administered encounter medications on  file as of 05/09/2021.  ? ? ?Allergies (verified) ?Ibuprofen  ? ?History: ?Past Medical History:  ?Diagnosis Date  ? ALLERGIC RHINITIS 06/24/2007  ? Anemia   ? AVM (arteriovenous malformation) of colon   ? Blood transfusion without reported diagnosis 05/2015  ? COLONIC POLYPS, HX OF 06/24/2007  ? Coronary artery disease, non-occlusive 05/2015  ? Trop + w/ Acute Anemia =>CATH: small RI - Ostial 60%, ostial RCA 30% and dLAD 40-50%;; 9/'21: Cor Ca Score 624. Mild (25-49%) prox RCA & LAD,; Moderate (50-69%) Ostial Small RI & prox LCx.    ? DEPRESSION 10/08/2006  ? DIABETES MELLITUS, TYPE II 10/05/2006  ? GERD 06/24/2007  ? History of CVA (cerebrovascular accident) 08/10/2020  ? 08/2020- found to be in afib with RVR, started on Eliquis (shower of emboli from A. fib as complication of severe AS)  ? HYPERLIPIDEMIA 10/08/2006  ? HYPERTENSION 10/08/2006  ? INSOMNIA 09/24/2008  ? Left knee DJD   ? Nonsustained paroxysmal ventricular tachycardia ([place]) 10/2018  ? Post-TAVR Zio Patch: Frequent (206) runs of NSVT - Fastest 5 beats 239 bpm. Longest 9.3 Sec avg 135 bpm. (? possibly Afib w/ aberrancy)  ? Osteoporosis 03/10/2016  ? PAF (paroxysmal atrial fibrillation) ([place])   ? PAF with RVR: CHA2DS2-VASc 9.  On Eliquis and amiodarone. 08/11/2020  ? PEPTIC ULCER DISEASE 10/08/2006  ? S/P TAVR (transcatheter aortic valve replacement) 10/26/2020  ? s/p TAVR with a 26 mm Medtronic Evolut Pro+ via the TF approach by Dr. Angelena Form & Dr. Cyndia Bent  ? Severe aortic stenosis 08/2020  ? Progression from Mod-Severe AS to Severe AS - noted on Echo 08/2020 -> (mean gradient progressed from 34 to 42 mmHg) this was in setting of new onset A. fib RVR, acute diastolic HF and shower of emboli CVA. ->  Referred for TAVR, completed 10/26/2020  ? ?Past Surgical History:  ?Procedure Laterality Date  ? APPENDECTOMY    ? BIOPSY  10/21/2020  ? Procedure: BIOPSY;  Surgeon: Sharyn Creamer, MD;  Location: [facility] ENDOSCOPY;  Service: Gastroenterology;;  ? BREAST BIOPSY    ?  CARDIAC CATHETERIZATION N/A 05/21/2015  ? Procedure: Left Heart Cath and Coronary Angiography;  Surgeon: Leonie Man, MD;  Location: [facility] CV LAB;  Service: Cardiovascular;: Ost RI 60%, Ost RCA 30%, dLAD tapers to small vessel w/ 40-50%. Mildly elevated LVEDP. Normal LV Fxn.  ? COLONOSCOPY N/A 05/20/2015  ? Procedure: COLONOSCOPY;  Surgeon: Manus Gunning, MD;  Location: [facility] ENDOSCOPY;  Service: Gastroenterology;  Laterality: N/A;  ? CORONARY CA2+ SCORE / CARDIAC CT ANGIOGRAM  10/09/2019  ? Calcium score 624.  82nd percentile. Dominant RCA: Mild (25-49%) proximal stenosis-distal bifurcation into PDA and PAV--< RPL branches.  LAD (1 major mid vessel diagonal) diffuse calcified plaque, mild proximal stenosis with minimal distal stenosis.  Small RI moderate ostial disease.  LCx-moderate mixed (50-69%) proximal stenosis.  Small dOM1 disease.  Trileaflet AoV, annular Ca2+ - probable AS  ? ESOPHAGOGASTRODUODENOSCOPY N/A 05/20/2015  ?  Procedure: ESOPHAGOGASTRODUODENOSCOPY (EGD);  Surgeon: Manus Gunning, MD;  Location: [place];  Service: Gastroenterology;  Laterality: N/A;  ? ESOPHAGOGASTRODUODENOSCOPY (EGD) WITH PROPOFOL N/A 10/21/2020  ? Procedure: ESOPHAGOGASTRODUODENOSCOPY (EGD) WITH PROPOFOL;  Surgeon: Sharyn Creamer, MD;  Location: [place];  Service: Gastroenterology;  Laterality: N/A;  ? INTRAOPERATIVE TRANSTHORACIC ECHOCARDIOGRAM N/A 10/26/2020  ? Procedure: INTRAOPERATIVE TRANSTHORACIC ECHOCARDIOGRAM;  Surgeon: Angelena Form; Location: [facility] OR; Pre-TAVR well-visualized calcified AoV mean Grad 37 mmHg, AVA 0.72 cm? => Post TAVR well-positioned supra-annular 26 mm Medtronic Evolut Pro valve placed with no PVL.  Mean gradient 14 mmHg.  AVR 1.58 cm?.  Normal flow to RCA and LM-LAD.  ; EF 60 to 65%.  Degenerative severe MAC w/ mild MR.  ? RIGHT/LEFT HEART CATH AND CORONARY ANGIOGRAPHY N/A 09/29/2020  ? Procedure: RIGHT/LEFT HEART CATH AND CORONARY ANGIOGRAPHY;  Surgeon: Burnell Blanks, MD;  Location: [place] CV LAB;  Service: Cardiovascular; pre-TAVR:  pRCA 20%, m-dRCA 30%. D LM-pLAD 20%. RI 30%, mLAD 20%.  Severe AS with mean gradient measured at 52.8 mmHg.  ? TONSILLECTOMY    ? TRANSCATHETE

## 2021-05-09 NOTE — Telephone Encounter (Signed)
Patient is needing a rx to discontinue her oxygen and concentrator so that [facility] can come and pick it up from her home.  She stated that she is needing an order for a portable concentrator that she would like to purchase on her own and not use [facility] for that.  Also, she stated that she saw Dr. Herbie Baltimore last week and she can not take oral iron.  She and Dr. Herbie Baltimore discussed possible liquid iron (Dr. Herbie Baltimore was suppose to get in contact with Dr. Jonny Ruiz regarding this).  Advised that I would send message to Dr. Jonny Ruiz. ?

## 2021-05-09 NOTE — Patient Instructions (Signed)
Ms. Fazzino , ?Thank you for taking time to come for your Medicare Wellness Visit. I appreciate your ongoing commitment to your health goals. Please review the following plan we discussed and let me know if I can assist you in the future.  ? ?Screening recommendations/referrals: ?Colonoscopy: Not a candidate for screening due to age ?Mammogram: Not a candidate for screening due to age ?Bone Density: 03/09/2016; due every 2-3 years ?Recommended yearly ophthalmology/optometry visit for glaucoma screening and checkup ?Recommended yearly dental visit for hygiene and checkup ? ?Vaccinations: ?Influenza vaccine: 10/13/2020 ?Pneumococcal vaccine: 11/06/2005, 12/03/2012 ?Tdap vaccine: 09/13/2018; due every 10 years ?Shingles vaccine: never done   ?Covid-19: 05/01/2019, 05/26/2019, 05/12/2020 ? ?Advanced directives: Advance directive discussed with you today. Even though you declined this today please call our office should you change your mind and we can give you the proper paperwork for you to fill out. ? ?Conditions/risks identified: Yes ? ?Next appointment: Please schedule your next Medicare Wellness Visit with your Nurse Health Advisor in 1 year or 366 days by calling [phone]. ? ? ?Preventive Care 52 Years and Older, Female ?Preventive care refers to lifestyle choices and visits with your health care provider that can promote health and wellness. ?What does preventive care include? ?A yearly physical exam. This is also called an annual well check. ?Dental exams once or twice a year. ?Routine eye exams. Ask your health care provider how often you should have your eyes checked. ?Personal lifestyle choices, including: ?Daily care of your teeth and gums. ?Regular physical activity. ?Eating a healthy diet. ?Avoiding tobacco and drug use. ?Limiting alcohol use. ?Practicing safe sex. ?Taking low-dose aspirin every day. ?Taking vitamin and mineral supplements as recommended by your health care provider. ?What happens during an annual  well check? ?The services and screenings done by your health care provider during your annual well check will depend on your age, overall health, lifestyle risk factors, and family history of disease. ?Counseling  ?Your health care provider may ask you questions about your: ?Alcohol use. ?Tobacco use. ?Drug use. ?Emotional well-being. ?Home and relationship well-being. ?Sexual activity. ?Eating habits. ?History of falls. ?Memory and ability to understand (cognition). ?Work and work Astronomer. ?Reproductive health. ?Screening  ?You may have the following tests or measurements: ?Height, weight, and BMI. ?Blood pressure. ?Lipid and cholesterol levels. These may be checked every 5 years, or more frequently if you are over 2 years old. ?Skin check. ?Lung cancer screening. You may have this screening every year starting at age 66 if you have a 30-pack-year history of smoking and currently smoke or have quit within the past 15 years. ?Fecal occult blood test (FOBT) of the stool. You may have this test every year starting at age 54. ?Flexible sigmoidoscopy or colonoscopy. You may have a sigmoidoscopy every 5 years or a colonoscopy every 10 years starting at age 43. ?Hepatitis C blood test. ?Hepatitis B blood test. ?Sexually transmitted disease (STD) testing. ?Diabetes screening. This is done by checking your blood sugar (glucose) after you have not eaten for a while (fasting). You may have this done every 1-3 years. ?Bone density scan. This is done to screen for osteoporosis. You may have this done starting at age 8. ?Mammogram. This may be done every 1-2 years. Talk to your health care provider about how often you should have regular mammograms. ?Talk with your health care provider about your test results, treatment options, and if necessary, the need for more tests. ?Vaccines  ?Your health care provider may recommend certain vaccines,  such as: ?Influenza vaccine. This is recommended every year. ?Tetanus, diphtheria,  and acellular pertussis (Tdap, Td) vaccine. You may need a Td booster every 10 years. ?Zoster vaccine. You may need this after age 56. ?Pneumococcal 13-valent conjugate (PCV13) vaccine. One dose is recommended after age 44. ?Pneumococcal polysaccharide (PPSV23) vaccine. One dose is recommended after age 66. ?Talk to your health care provider about which screenings and vaccines you need and how often you need them. ?This information is not intended to replace advice given to you by your health care provider. Make sure you discuss any questions you have with your health care provider. ?Document Released: 02/19/2015 Document Revised: 10/13/2015 Document Reviewed: 11/24/2014 ?Elsevier Interactive Patient Education ? 2017 [employer] ? ?Fall Prevention in the Home ?Falls can cause injuries. They can happen to people of all ages. There are many things you can do to make your home safe and to help prevent falls. ?What can I do on the outside of my home? ?Regularly fix the edges of walkways and driveways and fix any cracks. ?Remove anything that might make you trip as you walk through a door, such as a raised step or threshold. ?Trim any bushes or trees on the path to your home. ?Use bright outdoor lighting. ?Clear any walking paths of anything that might make someone trip, such as rocks or tools. ?Regularly check to see if handrails are loose or broken. Make sure that both sides of any steps have handrails. ?Any raised decks and porches should have guardrails on the edges. ?Have any leaves, snow, or ice cleared regularly. ?Use sand or salt on walking paths during winter. ?Clean up any spills in your garage right away. This includes oil or grease spills. ?What can I do in the bathroom? ?Use night lights. ?Install grab bars by the toilet and in the tub and shower. Do not use towel bars as grab bars. ?Use non-skid mats or decals in the tub or shower. ?If you need to sit down in the shower, use a plastic, non-slip  stool. ?Keep the floor dry. Clean up any water that spills on the floor as soon as it happens. ?Remove soap buildup in the tub or shower regularly. ?Attach bath mats securely with double-sided non-slip rug tape. ?Do not have throw rugs and other things on the floor that can make you trip. ?What can I do in the bedroom? ?Use night lights. ?Make sure that you have a light by your bed that is easy to reach. ?Do not use any sheets or blankets that are too big for your bed. They should not hang down onto the floor. ?Have a firm chair that has side arms. You can use this for support while you get dressed. ?Do not have throw rugs and other things on the floor that can make you trip. ?What can I do in the kitchen? ?Clean up any spills right away. ?Avoid walking on wet floors. ?Keep items that you use a lot in easy-to-reach places. ?If you need to reach something above you, use a strong step stool that has a grab bar. ?Keep electrical cords out of the way. ?Do not use floor polish or wax that makes floors slippery. If you must use wax, use non-skid floor wax. ?Do not have throw rugs and other things on the floor that can make you trip. ?What can I do with my stairs? ?Do not leave any items on the stairs. ?Make sure that there are handrails on both sides of the stairs and  use them. Fix handrails that are broken or loose. Make sure that handrails are as long as the stairways. ?Check any carpeting to make sure that it is firmly attached to the stairs. Fix any carpet that is loose or worn. ?Avoid having throw rugs at the top or bottom of the stairs. If you do have throw rugs, attach them to the floor with carpet tape. ?Make sure that you have a light switch at the top of the stairs and the bottom of the stairs. If you do not have them, ask someone to add them for you. ?What else can I do to help prevent falls? ?Wear shoes that: ?Do not have high heels. ?Have rubber bottoms. ?Are comfortable and fit you well. ?Are closed at the  toe. Do not wear sandals. ?If you use a stepladder: ?Make sure that it is fully opened. Do not climb a closed stepladder. ?Make sure that both sides of the stepladder are locked into place. ?Ask someone to hold it

## 2021-05-09 NOTE — Telephone Encounter (Signed)
Ok this can be addressed at next visit thanks ?

## 2021-05-10 ENCOUNTER — Ambulatory Visit (INDEPENDENT_AMBULATORY_CARE_PROVIDER_SITE_OTHER): Payer: Medicare Other | Admitting: *Deleted

## 2021-05-10 DIAGNOSIS — I5032 Chronic diastolic (congestive) heart failure: Secondary | ICD-10-CM

## 2021-05-10 DIAGNOSIS — E1165 Type 2 diabetes mellitus with hyperglycemia: Secondary | ICD-10-CM

## 2021-05-10 DIAGNOSIS — I480 Paroxysmal atrial fibrillation: Secondary | ICD-10-CM

## 2021-05-10 NOTE — Telephone Encounter (Signed)
Patient notified

## 2021-05-10 NOTE — Chronic Care Management (AMB) (Signed)
?Chronic Care Management  ? ?CCM RN Visit Note ? ?05/10/2021 ?Name: Mary Mccarthy MRN: [id number] DOB: 02-08-1940 ? ?Subjective: ?Mary Mccarthy is a 81 y.o. year old female who is a primary care patient of Biagio Borg, MD. The care management team was consulted for assistance with disease management and care coordination needs.   ? ?Engaged with patient by telephone for follow up visit in response to provider referral for case management and/or care coordination services.  ? ?Consent to Services:  ?The patient was given information about Chronic Care Management services, agreed to services, and gave verbal consent prior to initiation of services.  Please see initial visit note for detailed documentation.  ?Patient agreed to services and verbal consent obtained.  ? ?Assessment: Review of patient past medical history, allergies, medications, health status, including review of consultants reports, laboratory and other test data, was performed as part of comprehensive evaluation and provision of chronic care management services.  ?CCM Care Plan ? ?Allergies  ?Allergen Reactions  ? Ibuprofen Other (See Comments)  ?  Bleeding events  ? ?Outpatient Encounter Medications as of 05/10/2021  ?Medication Sig  ? acetaminophen (TYLENOL) 500 MG tablet Take 500 mg by mouth every 6 (six) hours as needed for moderate pain.  ? ALPRAZolam (XANAX) 0.25 MG tablet TAKE 1 TABLET BY MOUTH THREE TIMES A DAY AS NEEDED FOR ANXIETY  ? amiodarone (PACERONE) 200 MG tablet Take 1 tablet (200 mg total) by mouth daily.  ? amLODipine (NORVASC) 10 MG tablet Take 1 tablet (10 mg total) by mouth daily.  ? atorvastatin (LIPITOR) 40 MG tablet TAKE 1 TABLET BY MOUTH EVERY DAY  ? blood glucose meter kit and supplies Dispense based on patient and insurance preference. Use up to four times daily as directed. E11.9  ? Blood Glucose Monitoring Suppl (ONE TOUCH ULTRA 2) w/Device KIT Use as directed daily  ? citalopram (CELEXA) 10 MG tablet Take 1 tablet (10 mg  total) by mouth daily.  ? cyanocobalamin ([employer] VITAMIN B12) 1000 MCG tablet TAKE 1 TABLET BY MOUTH EVERY DAY  ? furosemide (LASIX) 40 MG tablet Take 1 tablet (40 mg total) by mouth daily.  ? iron polysaccharides (NU-IRON) 150 MG capsule Take 1 capsule (150 mg total) by mouth daily. (Patient not taking: Reported on 05/09/2021)  ? Lancets (ONETOUCH DELICA PLUS [id number]) MISC USE AS DIRECTED TEST 1-2 TIMES A DAY  ? levothyroxine (SYNTHROID) 25 MCG tablet Take 1 tablet (25 mcg total) by mouth daily.  ? losartan (COZAAR) 50 MG tablet Take 1 tablet (50 mg total) by mouth daily.  ? metFORMIN (GLUCOPHAGE) 1000 MG tablet TAKE 1 TAB BY MOUTH IN THE AM,AND 1/2 TAB IN THE PM  ? metoprolol succinate (TOPROL-XL) 25 MG 24 hr tablet Take 0.5 tablets (12.5 mg total) by mouth daily.  ? mirtazapine (REMERON) 15 MG tablet Take 15 mg by mouth at bedtime.  ? ONETOUCH ULTRA test strip USE AS INSTRUCTED TEST 1-2 TIMES A DAY  ? pantoprazole (PROTONIX) 40 MG tablet TAKE 1 TABLET BY MOUTH TWICE A DAY  ? potassium chloride SA (KLOR-CON M) 20 MEQ tablet Take 1 tablet (20 mEq total) by mouth daily.  ? ?No facility-administered encounter medications on file as of 05/10/2021.  ? ?Patient Active Problem List  ? Diagnosis Date Noted  ? Vitamin D deficiency 04/20/2021  ? Abnormal TSH 04/20/2021  ? Encounter for well adult exam with abnormal findings 02/16/2021  ? Long term current use of amiodarone for rate-rhythm control of A. fib 11/28/2020  ?  Chronic respiratory failure ([place]) 11/22/2020  ? Dehydration 11/02/2020  ? Diabetes mellitus type 2 in obese ([place]) 11/02/2020  ? AKI (acute kidney injury) ([place]) 11/01/2020  ? Transient post TAVR CHB -> not seen on follow-up Zio patch 10/28/2020  ? S/P TAVR (transcatheter aortic valve replacement) 10/26/2020  ? Melena 10/20/2020  ? Symptomatic anemia 10/19/2020  ? Acute bronchitis 09/21/2020  ? Hypercoagulable state due to atrial fibrillation ([place]) 08/23/2020  ? PAF with RVR: CHA2DS2-VASc 9.  On Eliquis and  amiodarone. 08/11/2020  ? History of CVA (cerebrovascular accident) 08/10/2020  ? Hypomagnesemia 08/10/2020  ? Severe aortic stenosis 08/2020  ? Aortic atherosclerosis ([place]) 03/18/2020  ? B12 deficiency 09/21/2019  ? Cough 03/14/2018  ? Wheezing 03/14/2018  ? Anxiety 09/11/2017  ? Osteoporosis 03/10/2016  ? Coronary artery disease, non-occlusive 05/24/2015  ? GI bleed   ? Pulmonary hypertension ([place])   ? CHF (congestive heart failure), NYHA class II, chronic, diastolic ([place]) 38/88/2800  ? Gastric ulceration   ? AVM (arteriovenous malformation) of colon   ? Demand ischemia of myocardium ([place])   ? Right knee pain 05/21/2014  ? Anemia, iron deficiency 05/20/2013  ? MENOPAUSAL DISORDER 12/07/2009  ? INSOMNIA 09/24/2008  ? ALLERGIC RHINITIS 06/24/2007  ? GERD 06/24/2007  ? COLONIC POLYPS, HX OF 06/24/2007  ? Hyperlipidemia with target LDL less than 70 10/08/2006  ? Anxiety with depression 10/08/2006  ? Essential hypertension 10/08/2006  ? Peptic ulcer 10/08/2006  ? Type 2 diabetes mellitus with hyperglycemia, without long-term current use of insulin ([place]) 10/05/2006  ? Morbid obesity ([place]) 10/05/2006  ? ?Conditions to be addressed/monitored:  Atrial Fibrillation, CHF, and DMII ? ?Care Plan : RN Care Manager Plan of Care  ?Updates made by Knox Royalty, RN since 05/10/2021 12:00 AM  ?  ? ?Problem: Chronic Disease Management Needs   ?Priority: High  ?  ? ?Long-Range Goal: Ongoing adherence to established plan of care for long term chronic disease management   ?Start Date: 11/17/2020  ?Expected End Date: 11/17/2021  ?Priority: High  ?Note:   ?Current Barriers:  ?Chronic Disease Management support and education needs related to CHF and DMII ?Lives with son- who works during daytime hours: alone during day, limited stamina to perform ADL's/ iADL's after multiple recent hospitalizations ?Fragile state of health, multiple progressing chronic health conditions ?Confusion around multiple new medications, post- recent  hospitalizations- 12/06/20- confirmed patient has spoken with [facility] team; 02/21/21- confirmed CCM pharmacy team active ?12/06/20: Patient has received home oxygen, however, she reports not using: states oxygenator is "too loud" and keeps her awake: 01/19/21: Now resolved ?12/20/20-- reports unable to get through to [facility]: care coordination outreach placed on patient's behalf ?01/19/21: reports [employer] came out to her home after last care coordination outreach and replaced the oxygenator- reports "it is much better and much more quiet; I am using it occasionally" ?03/30/21: patient reports "rarely" uses home O2; she is considering having this returned to company ?05/10/21: patient reports considering buying her own portable home O2 system: verbalizes plan to discuss with Dr. Jenny Reichmann at upcoming scheduled office visit 05/24/21 ?Multiple recent inpatient hospitalizations: 05/10/21: confirmed no new/ recent unplanned hospital admissions since last discharge 11/04/20 ?August 22-26, 2022: CHF ?September 12-22, 2022: AF with RVR; CHF; TAVR ?September 26-29, 2022: N/V, AKI ? ?RNCM Clinical Goal(s):  ?Patient will verbalize basic understanding of CHF and DMII disease process and self health management plan CHF; A-Fib; DMII  through collaboration with RN Care manager, provider, and care team.  ? ?  Interventions: ?1:1 collaboration with primary care provider regarding development and update of comprehensive plan of care as evidenced by provider attestation and co-signature ?Inter-disciplinary care team collaboration (see longitudinal plan of care) ?Evaluation of current treatment plan related to  self management and patient's adherence to plan as established by provider ?Review of patient status, including review of consultants reports, relevant laboratory and other test results, and medications completed ?Pain assessment updated: denies pain today ?Falls assessment updated: she continues to deny new/ recent falls x 12 months-  continues using cane and walker;  positive reinforcement provided with encouragement to continue efforts at fall prevention; previously provided education around fall risks/ prevention reinforced ?Medications discussed:

## 2021-05-10 NOTE — Patient Instructions (Signed)
Visit Information ? ?Tayllor, thank you for taking time to talk with me today. Please don't hesitate to contact me if I can be of assistance to you before our next scheduled telephone appointment ? ?Below are the goals we discussed today:  ?Patient Self-Care Activities: ?Patient Gerianne will: ?Take medications as prescribed   ?Attend all scheduled provider appointments  ?Call pharmacy for medication refills  ?Call provider office for new concerns or questions  ?Continue to follow heart healthy, low salt, carbohydrate modified, low sugar diet ?Continue to monitor and record fasting and afternoon blood sugars at home daily ?Continue to monitor and record daily weights at home: you reported a weight this morning of 183 lbs ?Continue to keep Dr. Jonny Ruiz and Dr. Herbie Baltimore updated around your wishes for new portable home O2  ?Keep up the great work preventing falls-- continue using your cane and walker as needed ?Stay in touch with the pharmacist at Dr. Raphael Gibney office to make sure your medications are regularly updated and optimized ? ?Our next scheduled telephone follow up visit/ appointment is scheduled on: Tuesday, Jun 07, 2021 at 3:00 pm- This is a PHONE CALL appointment ? ?If you need to cancel or re-schedule our visit, please call [phone] and our care guide team will be happy to assist you. ?  ?I look forward to hearing about your progress. ?  ?Caryl Pina, RN, BSN, CCRN Alumnus ?CCM Clinic RN Care Coordination- [facility] ?([phone]: direct office ? ?If you are experiencing a Mental Health or Behavioral Health Crisis or need someone to talk to, please  ?call the Suicide and Crisis Lifeline: 988 ?call the [place] National Suicide Prevention Lifeline: 360-763-0262 or TTY: 941-561-8460 TTY 414-238-3328) to talk to a trained counselor ?call 1-800-273-TALK (toll free, 24 hour hotline) ?go to [facility] [address], [place] [phone]) ?call 911  ? ?The  patient verbalized understanding of instructions, educational materials, and care plan provided today and agreed to receive a mailed copy of patient instructions, educational materials, and care plan ? ?Hypertension, Adult ?High blood pressure (hypertension) is when the force of blood pumping through the arteries is too strong. The arteries are the blood vessels that carry blood from the heart throughout the body. Hypertension forces the heart to work harder to pump blood and may cause arteries to become narrow or stiff. Untreated or uncontrolled hypertension can cause a heart attack, heart failure, a stroke, kidney disease, and other problems. ?A blood pressure reading consists of a higher number over a lower number. Ideally, your blood pressure should be below 120/80. The first ("top") number is called the systolic pressure. It is a measure of the pressure in your arteries as your heart beats. The second ("bottom") number is called the diastolic pressure. It is a measure of the pressure in your arteries as the heart relaxes. ?What are the causes? ?The exact cause of this condition is not known. There are some conditions that result in or are related to high blood pressure. ?What increases the risk? ?Some risk factors for high blood pressure are under your control. The following factors may make you more likely to develop this condition: ?Smoking. ?Having type 2 diabetes mellitus, high cholesterol, or both. ?Not getting enough exercise or physical activity. ?Being overweight. ?Having too much fat, sugar, calories, or salt (sodium) in your diet. ?Drinking too much alcohol. ?Some risk factors for high blood pressure may be difficult or impossible to change. Some of these factors include: ?Having chronic  kidney disease. ?Having a family history of high blood pressure. ?Age. Risk increases with age. ?Race. You may be at higher risk if you are African American. ?Gender. Men are at higher risk than women before age 23.  After age 40, women are at higher risk than men. ?Having obstructive sleep apnea. ?Stress. ?What are the signs or symptoms? ?High blood pressure may not cause symptoms. Very high blood pressure (hypertensive crisis) may cause: ?Headache. ?Anxiety. ?Shortness of breath. ?Nosebleed. ?Nausea and vomiting. ?Vision changes. ?Severe chest pain. ?Seizures. ?How is this diagnosed? ?This condition is diagnosed by measuring your blood pressure while you are seated, with your arm resting on a flat surface, your legs uncrossed, and your feet flat on the floor. The cuff of the blood pressure monitor will be placed directly against the skin of your upper arm at the level of your heart. It should be measured at least twice using the same arm. Certain conditions can cause a difference in blood pressure between your right and left arms. ?Certain factors can cause blood pressure readings to be lower or higher than normal for a short period of time: ?When your blood pressure is higher when you are in a health care provider's office than when you are at home, this is called white coat hypertension. Most people with this condition do not need medicines. ?When your blood pressure is higher at home than when you are in a health care provider's office, this is called masked hypertension. Most people with this condition may need medicines to control blood pressure. ?If you have a high blood pressure reading during one visit or you have normal blood pressure with other risk factors, you may be asked to: ?Return on a different day to have your blood pressure checked again. ?Monitor your blood pressure at home for 1 week or longer. ?If you are diagnosed with hypertension, you may have other blood or imaging tests to help your health care provider understand your overall risk for other conditions. ?How is this treated? ?This condition is treated by making healthy lifestyle changes, such as eating healthy foods, exercising more, and reducing  your alcohol intake. Your health care provider may prescribe medicine if lifestyle changes are not enough to get your blood pressure under control, and if: ?Your systolic blood pressure is above 130. ?Your diastolic blood pressure is above 80. ?Your personal target blood pressure may vary depending on your medical conditions, your age, and other factors. ?Follow these instructions at home: ?Eating and drinking ? ?Eat a diet that is high in fiber and potassium, and low in sodium, added sugar, and fat. An example eating plan is called the DASH (Dietary Approaches to Stop Hypertension) diet. To eat this way: ?Eat plenty of fresh fruits and vegetables. Try to fill one half of your plate at each meal with fruits and vegetables. ?Eat whole grains, such as whole-wheat pasta, brown rice, or whole-grain bread. Fill about one fourth of your plate with whole grains. ?Eat or drink low-fat dairy products, such as skim milk or low-fat yogurt. ?Avoid fatty cuts of meat, processed or cured meats, and poultry with skin. Fill about one fourth of your plate with lean proteins, such as fish, chicken without skin, beans, eggs, or tofu. ?Avoid pre-made and processed foods. These tend to be higher in sodium, added sugar, and fat. ?Reduce your daily sodium intake. Most people with hypertension should eat less than 1,500 mg of sodium a day. ?Do not drink alcohol if: ?Your health care provider  tells you not to drink. ?You are pregnant, may be pregnant, or are planning to become pregnant. ?If you drink alcohol: ?Limit how much you use to: ?0-1 drink a day for women. ?0-2 drinks a day for men. ?Be aware of how much alcohol is in your drink. In the [place] one drink equals one 12 oz bottle of beer (355 mL), one 5 oz glass of wine (148 mL), or one 1? oz glass of hard liquor (44 mL). ?Lifestyle ? ?Work with your health care provider to maintain a healthy body weight or to lose weight. Ask what an ideal weight is for you. ?Get at least 30 minutes of  exercise most days of the week. Activities may include walking, swimming, or biking. ?Include exercise to strengthen your muscles (resistance exercise), such as Pilates or lifting weights, as part of you

## 2021-05-13 ENCOUNTER — Other Ambulatory Visit: Payer: Self-pay | Admitting: Physician Assistant

## 2021-05-17 ENCOUNTER — Telehealth: Payer: Medicare Other

## 2021-05-24 ENCOUNTER — Encounter: Payer: Self-pay | Admitting: Internal Medicine

## 2021-05-24 ENCOUNTER — Ambulatory Visit (INDEPENDENT_AMBULATORY_CARE_PROVIDER_SITE_OTHER): Payer: Medicare Other | Admitting: Internal Medicine

## 2021-05-24 VITALS — BP 148/60 | HR 57 | Temp 97.5°F | Ht 61.0 in | Wt 184.0 lb

## 2021-05-24 DIAGNOSIS — D500 Iron deficiency anemia secondary to blood loss (chronic): Secondary | ICD-10-CM

## 2021-05-24 DIAGNOSIS — I10 Essential (primary) hypertension: Secondary | ICD-10-CM | POA: Diagnosis not present

## 2021-05-24 DIAGNOSIS — E538 Deficiency of other specified B group vitamins: Secondary | ICD-10-CM | POA: Diagnosis not present

## 2021-05-24 DIAGNOSIS — J9611 Chronic respiratory failure with hypoxia: Secondary | ICD-10-CM

## 2021-05-24 DIAGNOSIS — E559 Vitamin D deficiency, unspecified: Secondary | ICD-10-CM | POA: Diagnosis not present

## 2021-05-24 DIAGNOSIS — E039 Hypothyroidism, unspecified: Secondary | ICD-10-CM | POA: Diagnosis not present

## 2021-05-24 LAB — CBC WITH DIFFERENTIAL/PLATELET
Basophils Absolute: 0 10*3/uL (ref 0.0–0.1)
Basophils Relative: 0.4 % (ref 0.0–3.0)
Eosinophils Absolute: 0.1 10*3/uL (ref 0.0–0.7)
Eosinophils Relative: 1.1 % (ref 0.0–5.0)
HCT: 27.8 % — ABNORMAL LOW (ref 36.0–46.0)
Hemoglobin: 8.5 g/dL — ABNORMAL LOW (ref 12.0–15.0)
Lymphocytes Relative: 27.1 % (ref 12.0–46.0)
Lymphs Abs: 1.8 10*3/uL (ref 0.7–4.0)
MCHC: 30.7 g/dL (ref 30.0–36.0)
MCV: 79.3 fl (ref 78.0–100.0)
Monocytes Absolute: 0.7 10*3/uL (ref 0.1–1.0)
Monocytes Relative: 9.8 % (ref 3.0–12.0)
Neutro Abs: 4.2 10*3/uL (ref 1.4–7.7)
Neutrophils Relative %: 61.6 % (ref 43.0–77.0)
Platelets: 259 10*3/uL (ref 150.0–400.0)
RBC: 3.5 Mil/uL — ABNORMAL LOW (ref 3.87–5.11)
RDW: 20.3 % — ABNORMAL HIGH (ref 11.5–15.5)
WBC: 6.8 10*3/uL (ref 4.0–10.5)

## 2021-05-24 LAB — IBC PANEL
Iron: 17 ug/dL — ABNORMAL LOW (ref 42–145)
Saturation Ratios: 4.1 % — ABNORMAL LOW (ref 20.0–50.0)
TIBC: 410.2 ug/dL (ref 250.0–450.0)
Transferrin: 293 mg/dL (ref 212.0–360.0)

## 2021-05-24 LAB — TSH: TSH: 5.37 u[IU]/mL (ref 0.35–5.50)

## 2021-05-24 LAB — VITAMIN D 25 HYDROXY (VIT D DEFICIENCY, FRACTURES): VITD: 19.12 ng/mL — ABNORMAL LOW (ref 30.00–100.00)

## 2021-05-24 LAB — FERRITIN: Ferritin: 20 ng/mL (ref 10.0–291.0)

## 2021-05-24 LAB — T4, FREE: Free T4: 1.68 ng/dL — ABNORMAL HIGH (ref 0.60–1.60)

## 2021-05-24 MED ORDER — LOSARTAN POTASSIUM 100 MG PO TABS: 100.0000 mg | ORAL_TABLET | Freq: Every day | ORAL | 3 refills | Status: DC

## 2021-05-24 NOTE — Progress Notes (Signed)
Patient ID: Mary Mccarthy, female   DOB: 09-24-1940, 81 y.o.   MRN: [id number] ? ? ? ?    Chief Complaint: follow up HTN, low vit d, iron deficiency, low thyroid ? ?     HPI:  Mary Mccarthy is a 81 y.o. female here overall doing ok; Denies hyper or hypo thyroid symptoms such as voice, skin or hair change.  Pt denies chest pain, increased sob or doe, wheezing, orthopnea, PND, increased LE swelling, palpitations, dizziness or syncope.   Pt denies polydipsia, polyuria, or new focal neuro s/s.    Pt denies fever, wt loss, night sweats, loss of appetite, or other constitutional symptoms   Did aspirate a small piece of ice this AM and scared her, must have melted, and improved.  No other aspirations.   Will not take niferex oral iron  bc seemed to make her intermittent diarrhea worse. O/w Denies worsening reflux, abd pain, dysphagia, n/v, bowel change or blood.  ? ?Wt Readings from Last 3 Encounters:  ?05/24/21 184 lb (83.5 kg)  ?05/02/21 185 lb (83.9 kg)  ?04/20/21 190 lb 6.4 oz (86.4 kg)  ? ?BP Readings from Last 3 Encounters:  ?05/24/21 (!) 148/60  ?05/02/21 (!) 162/62  ?04/20/21 130/70  ? ?      ?Past Medical History:  ?Diagnosis Date  ? ALLERGIC RHINITIS 06/24/2007  ? Anemia   ? AVM (arteriovenous malformation) of colon   ? Blood transfusion without reported diagnosis 05/2015  ? COLONIC POLYPS, HX OF 06/24/2007  ? Coronary artery disease, non-occlusive 05/2015  ? Trop + w/ Acute Anemia =>CATH: small RI - Ostial 60%, ostial RCA 30% and dLAD 40-50%;; 9/'21: Cor Ca Score 624. Mild (25-49%) prox RCA & LAD,; Moderate (50-69%) Ostial Small RI & prox LCx.    ? DEPRESSION 10/08/2006  ? DIABETES MELLITUS, TYPE II 10/05/2006  ? GERD 06/24/2007  ? History of CVA (cerebrovascular accident) 08/10/2020  ? 08/2020- found to be in afib with RVR, started on Eliquis (shower of emboli from A. fib as complication of severe AS)  ? HYPERLIPIDEMIA 10/08/2006  ? HYPERTENSION 10/08/2006  ? INSOMNIA 09/24/2008  ? Left knee DJD   ? Nonsustained  paroxysmal ventricular tachycardia ([place]) 10/2018  ? Post-TAVR Zio Patch: Frequent (206) runs of NSVT - Fastest 5 beats 239 bpm. Longest 9.3 Sec avg 135 bpm. (? possibly Afib w/ aberrancy)  ? Osteoporosis 03/10/2016  ? PAF (paroxysmal atrial fibrillation) ([place])   ? PAF with RVR: CHA2DS2-VASc 9.  On Eliquis and amiodarone. 08/11/2020  ? PEPTIC ULCER DISEASE 10/08/2006  ? S/P TAVR (transcatheter aortic valve replacement) 10/26/2020  ? s/p TAVR with a 26 mm Medtronic Evolut Pro+ via the TF approach by Dr. Angelena Form & Dr. Cyndia Bent  ? Severe aortic stenosis 08/2020  ? Progression from Mod-Severe AS to Severe AS - noted on Echo 08/2020 -> (mean gradient progressed from 34 to 42 mmHg) this was in setting of new onset A. fib RVR, acute diastolic HF and shower of emboli CVA. ->  Referred for TAVR, completed 10/26/2020  ? ?Past Surgical History:  ?Procedure Laterality Date  ? APPENDECTOMY    ? BIOPSY  10/21/2020  ? Procedure: BIOPSY;  Surgeon: Sharyn Creamer, MD;  Location: [facility] ENDOSCOPY;  Service: Gastroenterology;;  ? BREAST BIOPSY    ? CARDIAC CATHETERIZATION N/A 05/21/2015  ? Procedure: Left Heart Cath and Coronary Angiography;  Surgeon: Leonie Man, MD;  Location: [place] CV LAB;  Service: Cardiovascular;: Ost RI 60%, Ost RCA 30%, dLAD  tapers to small vessel w/ 40-50%. Mildly elevated LVEDP. Normal LV Fxn.  ? COLONOSCOPY N/A 05/20/2015  ? Procedure: COLONOSCOPY;  Surgeon: Manus Gunning, MD;  [facility] ENDOSCOPY;  Service: Gastroenterology;  Laterality: N/A;  ? CORONARY CA2+ SCORE / CARDIAC CT ANGIOGRAM  10/09/2019  ? Calcium score 624.  82nd percentile. Dominant RCA: Mild (25-49%) proximal stenosis-distal bifurcation into PDA and PAV--< RPL branches.  LAD (1 major mid vessel diagonal) diffuse calcified plaque, mild proximal stenosis with minimal distal stenosis.  Small RI moderate ostial disease.  LCx-moderate mixed (50-69%) proximal stenosis.  Small dOM1 disease.  Trileaflet AoV, annular Ca2+ - probable AS   ? ESOPHAGOGASTRODUODENOSCOPY N/A 05/20/2015  ? Procedure: ESOPHAGOGASTRODUODENOSCOPY (EGD);  Surgeon: Manus Gunning, MD;  Location: [place];  Service: Gastroenterology;  Laterality: N/A;  ? ESOPHAGOGASTRODUODENOSCOPY (EGD) WITH PROPOFOL N/A 10/21/2020  ? Procedure: ESOPHAGOGASTRODUODENOSCOPY (EGD) WITH PROPOFOL;  Surgeon: Sharyn Creamer, MD;  Location: [place];  Service: Gastroenterology;  Laterality: N/A;  ? INTRAOPERATIVE TRANSTHORACIC ECHOCARDIOGRAM N/A 10/26/2020  ? Procedure: INTRAOPERATIVE TRANSTHORACIC ECHOCARDIOGRAM;  Surgeon: Angelena Form; Location: [facility] OR; Pre-TAVR well-visualized calcified AoV mean Grad 37 mmHg, AVA 0.72 cm? => Post TAVR well-positioned supra-annular 26 mm Medtronic Evolut Pro valve placed with no PVL.  Mean gradient 14 mmHg.  AVR 1.58 cm?.  Normal flow to RCA and LM-LAD.  ; EF 60 to 65%.  Degenerative severe MAC w/ mild MR.  ? RIGHT/LEFT HEART CATH AND CORONARY ANGIOGRAPHY N/A 09/29/2020  ? Procedure: RIGHT/LEFT HEART CATH AND CORONARY ANGIOGRAPHY;  Surgeon: Burnell Blanks, MD;  Location: [place] CV LAB;  Service: Cardiovascular; pre-TAVR:  pRCA 20%, m-dRCA 30%. D LM-pLAD 20%. RI 30%, mLAD 20%.  Severe AS with mean gradient measured at 52.8 mmHg.  ? TONSILLECTOMY    ? TRANSCATHETER AORTIC VALVE REPLACEMENT, TRANSFEMORAL N/A 10/26/2020  ? Procedure: TRANSCATHETER AORTIC VALVE REPLACEMENT, TRANSFEMORAL;  Surgeon: Burnell Blanks, MD;  Location: [facility] OR;  Service: Open Heart Surgery;; Medtronic Evolut-Pro + (size 26 mm, model # EVPROPLUS -26US, serial # [id number]); transient high-grade AV block noted so PPM left in place.  ? TRANSTHORACIC ECHOCARDIOGRAM  03/17/2019  ? a) 03/2019: EF 60 to 65%.  Moderate LVH.  GRII DD.  Mod-Severe AS (m grad 36 mmHg, peak 59 mmHg); b) 09/2019: EF 65-70%, No RWMA. Gr1 DD/hi LAP, Mild hi PAP. Mod LA Dil. MOD AS (mean Grad 34.5 mmHg).  STABLE  ? TRANSTHORACIC ECHOCARDIOGRAM  08/27/2020  ? Admitted for CVA/Afib RVR & CHF  (Progression to SEVERE SYMPTOMATIC AS):  Severe Calcific Aortic Stenosis (VTI AVA estimated 0.86 cm?, mean gradient 42 mmHg, V-max 4.36 m/s).  EF 55 to 60%.  Severe concentric LVH.  Unable to determine diastolic parameters.  Moderately elevated PAP.  Mild LA dilation.  Mild circumferential pericardial effusion.  Trivial MR.  Severe MAC.  ? TRANSTHORACIC ECHOCARDIOGRAM  11/25/2020  ? 1 Month s/p TAVR: EF 55 to 60%.  No R WMA.  GR 1 DD.  Elevated LVEDP.  Normal RV size with mildly elevated RVP estimated 44 mmHg.  Oscillating density in the RV suspect calcified chordal apparatus versus calcified thrombus.  Moderate LA dilation.  Normal IVC/RA P => Well-positioned 26 mm Medtronic Evolut Pro THVwith mean AOV gradient 11 mmHg.  Trivial PVL  ? TRANSTHORACIC ECHOCARDIOGRAM  11/02/2020  ? a) Day 1 Post-op TAVR 10/27/20:  Well-Positioned Supra Annular Medtronic Evolut Pro THP.  Mean gradient 12 mmHg, peak 24 mmHg.  AVA 1.8 cm?.  Trivial PVL.  EF 60 to 65%.  Normal LV function.  Mild LVH.  Severe LA dilation.  Mild RA dilation.  Severe MAC;; b) 11/02/20: Normal structure and function of the aortic valve prosthesis.  Mean gradient 9 mmHg (otherwise stable)  ? TUBAL LIGATION    ? ? reports that she has never smoked. She has never used smokeless tobacco. She reports that she does not drink alcohol and does not use drugs. ?family history includes Alcohol abuse in an other family member; Arrhythmia in her sister; Arthritis in an other family member; CAD in her sister; Cancer in her mother; Depression in an other family member; Diabetes in an other family member; Heart attack (age of onset: 41) in her sister; Heart disease in an other family member; Hyperlipidemia in an other family member; Hypertension in an other family member; Stroke in an other family member. ?Allergies  ?Allergen Reactions  ? Ibuprofen Other (See Comments)  ?  Bleeding events  ? ?Current Outpatient Medications on File Prior to Visit  ?Medication Sig Dispense  Refill  ? acetaminophen (TYLENOL) 500 MG tablet Take 500 mg by mouth every 6 (six) hours as needed for moderate pain.    ? ALPRAZolam (XANAX) 0.25 MG tablet TAKE 1 TABLET BY MOUTH THREE TIMES A DAY AS NEEDED FOR AN

## 2021-05-24 NOTE — Patient Instructions (Addendum)
We checked your ambulatory o2 saturation today ? ?Please continue to take OTC Vitamin D3 at 2000 units per day, indefinitely ? ?Ok to increase the losartan to 100 mg per day ? ?Please continue all other medications as before, and refills have been done if requested. ? ?Please have the pharmacy call with any other refills you may need. ? ?Please keep your appointments with your specialists as you may have planned ? ?Please go to the LAB at the blood drawing area for the tests to be done ? ?You will be contacted by phone if any changes need to be made immediately.  Otherwise, you will receive a letter about your results with an explanation, but please check with MyChart first. ? ?Please remember to sign up for MyChart if you have not done so, as this will be important to you in the future with finding out test results, communicating by private email, and scheduling acute appointments online when needed. ? ?Please make an Appointment to return in 4 months, or sooner if needed   ?

## 2021-05-25 ENCOUNTER — Other Ambulatory Visit: Payer: Self-pay | Admitting: Internal Medicine

## 2021-05-25 ENCOUNTER — Encounter: Payer: Self-pay | Admitting: Internal Medicine

## 2021-05-25 DIAGNOSIS — I509 Heart failure, unspecified: Secondary | ICD-10-CM | POA: Diagnosis not present

## 2021-05-25 MED ORDER — POLYSACCHARIDE IRON COMPLEX 150 MG PO CAPS: 150.0000 mg | ORAL_CAPSULE | Freq: Two times a day (BID) | ORAL | 1 refills | Status: DC

## 2021-05-26 ENCOUNTER — Telehealth: Payer: Self-pay

## 2021-05-26 NOTE — Telephone Encounter (Signed)
Pt is calling back to get results. I advised the pt  of Dr. Jonny Ruiz result says The test results show that your current treatment is OK, as the tests are stable , except the iron is low.  Please increase the iron tablet to twice per day.   I will send the prescription and you should hear from the office as well. ?  ?Pt understood and had no further questions ? ?FYI ? ?

## 2021-05-28 ENCOUNTER — Encounter: Payer: Self-pay | Admitting: Internal Medicine

## 2021-05-28 DIAGNOSIS — E039 Hypothyroidism, unspecified: Secondary | ICD-10-CM | POA: Insufficient documentation

## 2021-05-28 NOTE — Assessment & Plan Note (Signed)
Lab Results  ?Component Value Date  ? IOXBDZHG99 1,512 (H) 02/11/2021  ? ?Stable, cont oral replacement - b12 1000 mcg qd ? ?

## 2021-05-28 NOTE — Assessment & Plan Note (Signed)
Now improved, amb o2 sat at 100 feet on RA is 96% - ok for d/c home o2 ?

## 2021-05-28 NOTE — Assessment & Plan Note (Signed)
Last vitamin D ?Lab Results  ?Component Value Date  ? VD25OH 19.12 (L) 05/24/2021  ? ?Low, to start oral replacement ? ?

## 2021-05-28 NOTE — Assessment & Plan Note (Signed)
No overt bleeding, ok for lab f/u ?

## 2021-05-28 NOTE — Assessment & Plan Note (Signed)
Lab Results  ?Component Value Date  ? TSH 5.37 05/24/2021  ? ?Stable, pt to continue levothyroxine ? ?

## 2021-05-28 NOTE — Assessment & Plan Note (Signed)
BP Readings from Last 3 Encounters:  ?05/24/21 (!) 148/60  ?05/02/21 (!) 162/62  ?04/20/21 130/70  ? ?Uncontrolled, for increased losartan 100 qd ? ?

## 2021-05-28 NOTE — Assessment & Plan Note (Signed)
Lab Results  ?Component Value Date  ? TSH 5.37 05/24/2021  ? ?Stable, pt to continue levothyroxine ? ?

## 2021-05-30 ENCOUNTER — Other Ambulatory Visit: Payer: Self-pay | Admitting: Internal Medicine

## 2021-05-30 ENCOUNTER — Telehealth: Payer: Self-pay

## 2021-05-30 NOTE — Telephone Encounter (Signed)
Pt is calling in to report that the iron polysaccharides (NU-IRON) 150 MG capsule is to expensive and she states that she and  Dr. Jonny Ruiz discussed her not being able to take the iron supplement as it causing Diarrhea.  ? ?Pt is also requesting that nurse contact [employer] to pick up oxygen and supplies so she will not be charge anymore as she doesn't use this now.  ? ?Please advise   ?

## 2021-05-31 NOTE — Telephone Encounter (Signed)
Order faxed to [facility] regarding oxygen. Please advise on iron supplement ?

## 2021-05-31 NOTE — Telephone Encounter (Signed)
Ok to take OTC iron sulfate 150 mg BID instead -  ?

## 2021-06-01 NOTE — Telephone Encounter (Signed)
Notified pt w/ MD response on otc iron.Marland KitchenRaechel Chute ?

## 2021-06-05 DIAGNOSIS — I5032 Chronic diastolic (congestive) heart failure: Secondary | ICD-10-CM

## 2021-06-05 DIAGNOSIS — E1165 Type 2 diabetes mellitus with hyperglycemia: Secondary | ICD-10-CM | POA: Diagnosis not present

## 2021-06-07 ENCOUNTER — Encounter: Payer: Self-pay | Admitting: *Deleted

## 2021-06-07 ENCOUNTER — Ambulatory Visit (INDEPENDENT_AMBULATORY_CARE_PROVIDER_SITE_OTHER): Payer: Medicare Other | Admitting: *Deleted

## 2021-06-07 DIAGNOSIS — E119 Type 2 diabetes mellitus without complications: Secondary | ICD-10-CM

## 2021-06-07 DIAGNOSIS — I5032 Chronic diastolic (congestive) heart failure: Secondary | ICD-10-CM

## 2021-06-07 NOTE — Patient Instructions (Addendum)
Visit Information ? ?Luz, thank you for taking time to talk with me today. Please don't hesitate to contact me if I can be of assistance to you before our next scheduled telephone appointment ? ?Below are the goals we discussed today:  ?Patient Self-Care Activities: ?Patient Darice will: ?Take medications as prescribed   ?Attend all scheduled provider appointments  ?Call pharmacy for medication refills  ?Call provider office for new concerns or questions  ?Continue to follow heart healthy, low salt, carbohydrate modified, low sugar diet ?Continue to monitor and record fasting and afternoon blood sugars at home daily-- the values we reviewed today from your home monitoring are all in very good range ?Continue to monitor and record daily weights at home: you reported a weight this morning of 189 lbs ?If you gain 3 or more pounds overnight, please call your cardiologist to get advice on what you should do ?Please schedule an appointment with your cardiologist  ?Keep up the great work preventing falls-- continue using your cane and walker as needed ?Stay in touch with the pharmacist at Dr. Gwynn Burly office to make sure your medications are regularly updated and optimized ? ?Our next scheduled telephone follow up visit/ appointment is scheduled on: Wednesday, September 02, 2021 at 3:00 pm- This is a PHONE CALL appointment ? ?If you need to cancel or re-schedule our visit, please call [phone] and our care guide team will be happy to assist you. ?  ?I look forward to hearing about your progress. ?  ?[name], RN, BSN, CCRN Alumnus ?[place] ?[phone]: direct office ? ?If you are experiencing a Mental Health or [place] or need someone to talk to, please  ?call the Suicide and Crisis Lifeline: 988 ?call the [place] National Suicide Prevention Lifeline: (680)226-3734 or TTY: 575-003-0496 TTY 4073949011) to talk to a trained counselor ?call  1-800-273-TALK (toll free, 24 hour hotline) ?go to [facility] [address], [place] [phone]) ?call 911  ? ?The patient verbalized understanding of instructions, educational materials, and care plan provided today and agreed to receive a mailed copy of patient instructions, educational materials, and care plan ? ?Living With Heart Failure ?Heart failure is a long-term (chronic) condition in which the heart cannot pump enough blood through the body. When this happens, parts of the body do not get the blood and oxygen they need. ?There is no cure for heart failure at this time, so it is important for you to take good care of yourself and follow the treatment plan you set with your health care provider. If you are living with heart failure, there are ways to help you manage the disease. ?How to manage lifestyle changes ?Living with heart failure requires you to make changes in your life. Your health care team will teach you about the changes you need to make in order to relieve your symptoms and lower your risk of going to the hospital. Work with your health care provider to develop a treatment plan that works for you. ?Activity ?Ask your health care provider about attending cardiac rehabilitation. These programs include aerobic physical activity, which provides many benefits for your heart. ?If no cardiac rehabilitation program is available, ask your health care provider what aerobic exercises are safe for you to do. ?Return to your normal activities as told by your health care provider. Ask your health care provider what activities are safe for you. ?Pace your daily activities and allow time  for rest as needed. ?Managing stress ?It is normal to have many emotions about your diagnosis, such as fear, sadness, anger, and loss. If you feel any of these emotions and need help coping, contact your health care provider. Here are some ways to help yourself manage these  emotions: ?Talk to friends and family members about your condition. They can give you support and guidance. Explain your symptoms to them and, if comfortable, invite them to attend appointments or rehabilitation with you. ?Join a support group for people with chronic heart failure. Talking with other people who have the same symptoms may give you new ways of coping with your disease and your emotions. ?Accept help from others. Do not be ashamed if you need help with certain tasks. ?Use stress management techniques, such as meditation, breathing exercises, or listening to relaxing music. ?Conditions such as depression and anxiety are common in persons with heart failure. Pay attention to changes in your mood, emotions, and stress levels. Tell your health care provider if you have any of the following symptoms: ?Trouble sleeping or a change in your sleeping patterns. ?Feeling sad, down, or depressed more often than not, every day for more than 2 weeks. ?Losing interest in activities you normally enjoy. ?Feeling irritable or crying for no reason. ?Finding yourself worrying about the future often. ?Work ?You may need to develop a plan with your health care provider if heart failure interferes with your ability to work. This may include: ?Reducing your work hours. ?Finding functions that are less active or require less effort. ?Planning rest periods during your work hours. ?Travel ?Talk with your health care provider if you plan to travel. There may be circumstances in which your health care provider recommends that you do not travel or that you delay travel until your condition is under control. ?When you travel, bring your medicine and a list of your medicines. If you are traveling by air, keep your medicines with you in a carry-on bag. ?Consider finding a medical facility in the area you will be traveling to and determine what your health insurance will cover. ?If you will be traveling by public transportation  (airplane, train, bus), contact the company prior to traveling if you have special needs. This may include needs related to diet, oxygen, a wheelchair, a seating request, or help with luggage. ?If you use oxygen, make sure to bring enough oxygen with you. ?If you have a battery-powered device, bring a fully charged extra battery with you. ?If you have a device, bring a note from your health care provider and inform all security screening personnel that you have the device. You may need to go through special screening for safety. ?Sexual activity ? ?Ask your health care provider when it is safe for you to resume sexual activity. ?You may need to start slowly and gradually increase intimacy. You can increase intimacy by doing such things as caressing, touching, and holding each other. ?Get regular exercise as told by your health care provider. This can benefit your sex life by building strength and endurance. ?Sleep ?If your condition interferes with your sleep, find ways to improve your sleep quality, such as: ?Sleep lying on your side, or sleep with your head elevated by raising the head of your bed or using multiple pillows. ?Ask your health care provider about screening for sleep apnea. ?Try to go to sleep and wake up at the same times every day. ?Sleep in a dark, cool room. ?Do not do any physical activity or  eating for a few hours before bedtime. ?Plan rest periods during the day, but do not take long naps during the day. ? ?Where to find support ?Consider talking with: ?Family members. ?Close friends. ?A mental health professional or therapist. ?A member of your church, faith, or community group. ?Other sources of support include: ?Local support groups. Ask your health care provider about groups near you. ?Online support groups, such as those found through the American Heart Association: supportnetwork.heart.org ?Local home care agencies, community agencies, or social agencies. ?A palliative care specialist.  Palliative care can help you manage symptoms, promote comfort, improve quality of life, and maintain dignity. ?Where to find more information ?American Heart Association: heart.org ?National Heart, Lung, and Blood [employer]

## 2021-06-07 NOTE — Chronic Care Management (AMB) (Signed)
?Chronic Care Management  ? ?CCM RN Visit Note ? ?06/07/2021 ?Name: Delenn Ahn MRN: [id number] DOB: 04/06/1940 ? ?Subjective: ?Jannah Guardiola is a 81 y.o. year old female who is a primary care patient of Biagio Borg, MD. The care management team was consulted for assistance with disease management and care coordination needs.   ? ?Engaged with patient by telephone for follow up visit in response to provider referral for case management and/or care coordination services.  ? ?Consent to Services:  ?The patient was given information about Chronic Care Management services, agreed to services, and gave verbal consent prior to initiation of services.  Please see initial visit note for detailed documentation.  ?Patient agreed to services and verbal consent obtained.  ? ?Assessment: Review of patient past medical history, allergies, medications, health status, including review of consultants reports, laboratory and other test data, was performed as part of comprehensive evaluation and provision of chronic care management services.  ?CCM Care Plan ? ?Allergies  ?Allergen Reactions  ? Ibuprofen Other (See Comments)  ?  Bleeding events  ? ?Outpatient Encounter Medications as of 06/07/2021  ?Medication Sig  ? acetaminophen (TYLENOL) 500 MG tablet Take 500 mg by mouth every 6 (six) hours as needed for moderate pain.  ? ALPRAZolam (XANAX) 0.25 MG tablet TAKE 1 TABLET BY MOUTH THREE TIMES A DAY AS NEEDED FOR ANXIETY  ? amiodarone (PACERONE) 200 MG tablet Take 1 tablet (200 mg total) by mouth daily.  ? amLODipine (NORVASC) 10 MG tablet Take 1 tablet (10 mg total) by mouth daily.  ? atorvastatin (LIPITOR) 40 MG tablet TAKE 1 TABLET BY MOUTH EVERY DAY  ? blood glucose meter kit and supplies Dispense based on patient and insurance preference. Use up to four times daily as directed. E11.9  ? Blood Glucose Monitoring Suppl (ONE TOUCH ULTRA 2) w/Device KIT Use as directed daily  ? citalopram (CELEXA) 10 MG tablet Take 1 tablet (10 mg  total) by mouth daily.  ? cyanocobalamin ([employer] VITAMIN B12) 1000 MCG tablet TAKE 1 TABLET BY MOUTH EVERY DAY  ? furosemide (LASIX) 40 MG tablet TAKE 1 TABLET BY MOUTH EVERY DAY  ? iron polysaccharides (NU-IRON) 150 MG capsule Take 1 capsule (150 mg total) by mouth 2 (two) times daily.  ? Lancets (ONETOUCH DELICA PLUS [id number]) MISC USE AS DIRECTED TEST 1-2 TIMES A DAY  ? levothyroxine (SYNTHROID) 25 MCG tablet Take 1 tablet (25 mcg total) by mouth daily.  ? losartan (COZAAR) 100 MG tablet Take 1 tablet (100 mg total) by mouth daily.  ? metFORMIN (GLUCOPHAGE) 1000 MG tablet TAKE 1 TAB BY MOUTH IN THE AM,AND 1/2 TAB IN THE PM  ? metoprolol succinate (TOPROL-XL) 25 MG 24 hr tablet Take 0.5 tablets (12.5 mg total) by mouth daily.  ? mirtazapine (REMERON) 15 MG tablet Take 15 mg by mouth at bedtime.  ? ONETOUCH ULTRA test strip USE AS INSTRUCTED TEST 1-2 TIMES A DAY  ? pantoprazole (PROTONIX) 40 MG tablet TAKE 1 TABLET BY MOUTH TWICE A DAY  ? potassium chloride SA (KLOR-CON M) 20 MEQ tablet Take 1 tablet (20 mEq total) by mouth daily.  ? ?No facility-administered encounter medications on file as of 06/07/2021.  ? ?Patient Active Problem List  ? Diagnosis Date Noted  ? Hypothyroidism 05/28/2021  ? Vitamin D deficiency 04/20/2021  ? Abnormal TSH 04/20/2021  ? Encounter for well adult exam with abnormal findings 02/16/2021  ? Long term current use of amiodarone for rate-rhythm control of A. fib 11/28/2020  ?  Chronic respiratory failure ([place]) 11/22/2020  ? Dehydration 11/02/2020  ? Diabetes mellitus type 2 in obese ([place]) 11/02/2020  ? AKI (acute kidney injury) ([place]) 11/01/2020  ? Transient post TAVR CHB -> not seen on follow-up Zio patch 10/28/2020  ? S/P TAVR (transcatheter aortic valve replacement) 10/26/2020  ? Melena 10/20/2020  ? Symptomatic anemia 10/19/2020  ? Acute bronchitis 09/21/2020  ? Hypercoagulable state due to atrial fibrillation ([place]) 08/23/2020  ? PAF with RVR: CHA2DS2-VASc 9.  On Eliquis and amiodarone.  08/11/2020  ? History of CVA (cerebrovascular accident) 08/10/2020  ? Hypomagnesemia 08/10/2020  ? Severe aortic stenosis 08/2020  ? Aortic atherosclerosis ([place]) 03/18/2020  ? B12 deficiency 09/21/2019  ? Cough 03/14/2018  ? Wheezing 03/14/2018  ? Anxiety 09/11/2017  ? Osteoporosis 03/10/2016  ? Coronary artery disease, non-occlusive 05/24/2015  ? GI bleed   ? Pulmonary hypertension ([place])   ? CHF (congestive heart failure), NYHA class II, chronic, diastolic ([place]) 41/93/7902  ? Gastric ulceration   ? AVM (arteriovenous malformation) of colon   ? Demand ischemia of myocardium ([place])   ? Right knee pain 05/21/2014  ? Anemia, iron deficiency 05/20/2013  ? MENOPAUSAL DISORDER 12/07/2009  ? INSOMNIA 09/24/2008  ? ALLERGIC RHINITIS 06/24/2007  ? GERD 06/24/2007  ? COLONIC POLYPS, HX OF 06/24/2007  ? Hyperlipidemia with target LDL less than 70 10/08/2006  ? Anxiety with depression 10/08/2006  ? Essential hypertension 10/08/2006  ? Peptic ulcer 10/08/2006  ? Type 2 diabetes mellitus with hyperglycemia, without long-term current use of insulin ([place]) 10/05/2006  ? Morbid obesity ([place]) 10/05/2006  ? ?Conditions to be addressed/monitored:  Atrial Fibrillation, CHF, and DMII ? ?Care Plan : RN Care Manager Plan of Care  ?Updates made by Knox Royalty, RN since 06/07/2021 12:00 AM  ?  ? ?Problem: Chronic Disease Management Needs   ?Priority: High  ?  ? ?Long-Range Goal: Ongoing adherence to established plan of care for long term chronic disease management   ?Start Date: 11/17/2020  ?Expected End Date: 11/17/2021  ?Priority: High  ?Note:   ?Current Barriers:  ?Chronic Disease Management support and education needs related to CHF and DMII ?Lives with son- who works during daytime hours: alone during day, limited stamina to perform ADL's/ iADL's after multiple recent hospitalizations ?Fragile state of health, multiple progressing chronic health conditions ?Confusion around multiple new medications, post- recent hospitalizations-  12/06/20- confirmed patient has spoken with [facility] team; 02/21/21- confirmed CCM pharmacy team active ?12/06/20: Patient has received home oxygen, however, she reports not using: states oxygenator is "too loud" and keeps her awake: 01/19/21: Now resolved ?12/20/20: reports unable to get through to [facility]: care coordination outreach placed on patient's behalf ?01/19/21: reports [employer] came out to her home after last care coordination outreach and replaced the oxygenator- reports "it is much better and much more quiet; I am using it occasionally" ?03/30/21: patient reports "rarely" uses home O2; she is considering having this returned to company ?05/10/21: patient reports considering buying her own portable home O2 system: verbalizes plan to discuss with Dr. Jenny Reichmann at upcoming scheduled office visit 05/24/21 ?05/24/21: confirmed that home O2 was discontinued by PCP during office visit 05/24/21-- patient reports [employer] has not yet picked up home equipment; pt. has contacted PCP office and was told that the discontinue order was faxed to [place] office visit; encouraged patient to contact [employer] to obtain information on when they expect to pick up equipment ?Multiple recent inpatient hospitalizations: 06/07/21: confirmed no new/ recent unplanned hospital admissions  since last discharge 11/04/20 ?August 22-26, 2022: CHF ?September 12-22, 2022: AF with RVR; CHF; TAVR ?September 26-29, 2022: N/V, AKI ? ?RNCM Clinical Goal(s):  ?Patient will verbalize basic understanding of CHF and DMII disease process and self health management plan CHF; A-Fib; DMII  through collaboration with RN Care manager, provider, and care team.  ? ?Interventions: ?1:1 collaboration with primary care provider regarding development and update of comprehensive plan of care as evidenced by provider attestation and co-signature ?Inter-disciplinary care team collaboration (see longitudinal plan of care) ?Evaluation of current treatment plan  related to  self management and patient's adherence to plan as established by provider ?Review of patient status, including review of consultants reports, relevant laboratory and other test results, and medications co

## 2021-07-05 ENCOUNTER — Ambulatory Visit: Payer: Medicare Other

## 2021-07-05 DIAGNOSIS — I480 Paroxysmal atrial fibrillation: Secondary | ICD-10-CM

## 2021-07-05 DIAGNOSIS — E039 Hypothyroidism, unspecified: Secondary | ICD-10-CM

## 2021-07-05 DIAGNOSIS — I10 Essential (primary) hypertension: Secondary | ICD-10-CM

## 2021-07-05 DIAGNOSIS — E785 Hyperlipidemia, unspecified: Secondary | ICD-10-CM

## 2021-07-05 DIAGNOSIS — E119 Type 2 diabetes mellitus without complications: Secondary | ICD-10-CM

## 2021-07-05 DIAGNOSIS — I5032 Chronic diastolic (congestive) heart failure: Secondary | ICD-10-CM

## 2021-07-05 NOTE — Progress Notes (Signed)
Chronic Care Management Pharmacy Note  07/05/2021 Name:  Mary Mccarthy MRN:  [id number] DOB:  1940/05/06  Summary: -Pt reports that she has been without metoprolol (out of refills )for the last 4-5 days per patient - reports that she has been checking BP but could not recall the recent readings  -Pt is not currently taking iron supplement as it causes diarrhea when taken  -BG at home averaging 129-140 - denies any issues with hypoglycemia  -reports that [facility] - has enough at this time    Recommendations/Changes made from today's visit: -Recommended for patient to decrease metformin to $RemoveBefo'500mg'NgLIuADbuxh$  twice daily due to current kidney function, continue to monitor kidney function as metformin will need to be held should eGFR <30  -Will ask PCP about refill on metoprolol, patient to record BP readings at home, to make clinic aware should BP average >140/90 -Reasonable to consider iron infusions for her anemia as she cannot tolerate iron tablets  -Advised patient that she is able to transfer alprazolam prescription to a different pharmacy when she needs a refill - pt to reach out with any issues   Subjective: Mary Mccarthy is an 81 y.o. year old female who is a primary patient of Jenny Reichmann, Hunt Oris, MD.  The CCM team was consulted for assistance with disease management and care coordination needs.    Engaged with patient by telephone for follow up visit in response to provider referral for pharmacy case management and/or care coordination services.   Consent to Services:  The patient was given the following information about Chronic Care Management services today, agreed to services, and gave verbal consent: 1. CCM service includes personalized support from designated clinical staff supervised by the primary care provider, including individualized plan of care and coordination with other care providers 2. 24/7 contact phone numbers for assistance for urgent and routine care  needs. 3. Service will only be billed when office clinical staff spend 20 minutes or more in a month to coordinate care. 4. Only one practitioner may furnish and bill the service in a calendar month. 5.The patient may stop CCM services at any time (effective at the end of the month) by phone call to the office staff. 6. The patient will be responsible for cost sharing (co-pay) of up to 20% of the service fee (after annual deductible is met). Patient agreed to services and consent obtained.  Patient Care Team: Biagio Borg, MD as PCP - General (Internal Medicine) Leonie Man, MD as PCP - Cardiology (Cardiology) Knox Royalty, RN as Case Manager Charlton Haws, [facility] as Pharmacist (Pharmacist) Katy Apo, MD as Consulting Physician (Ophthalmology)  Recent office visits: 05/24/2021 - Dr. Jenny Reichmann - increase losartan to $RemoveBef'100mg'XDELOHjhFn$  daily f/u In 4 months   Recent consult visits: 05/02/2021 - Dr. Ellyn Hack - Cardiology - increase losartan to $RemoveBef'50mg'dEUYgfxNma$  daily - continue without doac - maintain sinus rhythm with amiodarone   Hospital visits: None since last visit   Objective:  Lab Results  Component Value Date   CREATININE 1.38 (H) 04/20/2021   BUN 18 04/20/2021   GFR 35.64 (L) 04/20/2021   GFRNONAA 56 (L) 11/03/2020   GFRAA 57 (L) 10/01/2019   NA 140 04/20/2021   K 4.5 04/20/2021   CALCIUM 9.9 04/20/2021   CO2 28 04/20/2021   GLUCOSE 102 (H) 04/20/2021    Lab Results  Component Value Date/Time   HGBA1C 7.5 (H) 02/16/2021 02:55 PM   HGBA1C 5.8 (H) 10/26/2020 03:42  AM   GFR 35.64 (L) 04/20/2021 01:52 PM   GFR 40.96 (L) 02/16/2021 02:55 PM   MICROALBUR <0.7 02/16/2021 02:55 PM   MICROALBUR 1.8 03/18/2020 11:25 AM    Last diabetic Eye exam:  Lab Results  Component Value Date/Time   HMDIABEYEEXA No Retinopathy 07/16/2018 12:00 AM    Last diabetic Foot exam: No results found for: HMDIABFOOTEX   Lab Results  Component Value Date   CHOL 92 02/16/2021   HDL 34.60 (L) 02/16/2021    LDLCALC 40 02/16/2021   TRIG 88.0 02/16/2021   CHOLHDL 3 02/16/2021       Latest Ref Rng & Units 02/16/2021    2:55 PM 02/02/2021   11:44 AM 01/05/2021   11:34 AM  Hepatic Function  Total Protein 6.0 - 8.3 g/dL 7.2   6.6   7.0    Albumin 3.5 - 5.2 g/dL 3.9   3.9   4.2    AST 0 - 37 U/L $Remo'18   20   17    'HEnuh$ ALT 0 - 35 U/L $Remo'12   15   10    'Yvgob$ Alk Phosphatase 39 - 117 U/L 43   60   52    Total Bilirubin 0.2 - 1.2 mg/dL 0.6   0.5   0.4    Bilirubin, Direct 0.0 - 0.3 mg/dL 0.1        Lab Results  Component Value Date/Time   TSH 5.37 05/24/2021 01:50 PM   TSH 7.54 (H) 02/16/2021 02:55 PM   FREET4 1.68 (H) 05/24/2021 01:50 PM       Latest Ref Rng & Units 05/24/2021    1:50 PM 04/20/2021    1:52 PM 04/07/2021   10:35 AM  CBC  WBC 4.0 - 10.5 K/uL 6.8   6.3     Hemoglobin 12.0 - 15.0 g/dL 8.5  C 8.3 Repeated and verified X2.   7.8    Hematocrit 36.0 - 46.0 % 27.8   27.0   25.2    Platelets 150.0 - 400.0 K/uL 259.0   222.0       C Corrected result    Lab Results  Component Value Date/Time   VD25OH 19.12 (L) 05/24/2021 01:50 PM   VD25OH 22.16 (L) 02/16/2021 02:55 PM    Clinical ASCVD: Yes  The ASCVD Risk score (Arnett DK, et al., 2019) failed to calculate for the following reasons:   The 2019 ASCVD risk score is only valid for ages 23 to 40   The patient has a prior MI or stroke diagnosis       05/09/2021    1:12 PM 04/20/2021    1:00 PM 02/16/2021    2:25 PM  Depression screen PHQ 2/9  Decreased Interest 0 0 0  Down, Depressed, Hopeless 0 0 1  PHQ - 2 Score 0 0 1  Altered sleeping  0   Tired, decreased energy  0   Change in appetite  0   Feeling bad or failure about yourself   0   Trouble concentrating  0   Moving slowly or fidgety/restless  0   Suicidal thoughts  0   PHQ-9 Score  0      CHA2DS2-VASc Score = 8  The patient's score is based upon: CHF History: 0 HTN History: 1 Diabetes History: 1 Stroke History: 2 Vascular Disease History: 1 Age Score: 2 Gender Score:  1        Social History   Tobacco Use  Smoking Status Never  Smokeless Tobacco Never   BP Readings from Last 3 Encounters:  05/24/21 (!) 148/60  05/02/21 (!) 162/62  04/20/21 130/70   Pulse Readings from Last 3 Encounters:  05/24/21 (!) 57  05/02/21 (!) 57  04/20/21 (!) 57   Wt Readings from Last 3 Encounters:  05/24/21 184 lb (83.5 kg)  05/02/21 185 lb (83.9 kg)  04/20/21 190 lb 6.4 oz (86.4 kg)   BMI Readings from Last 3 Encounters:  05/24/21 34.77 kg/m  05/02/21 34.96 kg/m  04/20/21 35.98 kg/m    Assessment/Interventions: Review of patient past medical history, allergies, medications, health status, including review of consultants reports, laboratory and other test data, was performed as part of comprehensive evaluation and provision of chronic care management services.   SDOH:  (Social Determinants of Health) assessments and interventions performed: Yes  SDOH Screenings   Alcohol Screen: Low Risk    Last Alcohol Screening Score (AUDIT): 0  Depression (PHQ2-9): Low Risk    PHQ-2 Score: 0  Financial Resource Strain: Medium Risk   Difficulty of Paying Living Expenses: Somewhat hard  Food Insecurity: No Food Insecurity   Worried About Charity fundraiser in the Last Year: Never true   Ran Out of Food in the Last Year: Never true  Housing: Low Risk    Last Housing Risk Score: 0  Physical Activity: Inactive   Days of Exercise per Week: 0 days   Minutes of Exercise per Session: 0 min  Social Connections: Socially Isolated   Frequency of Communication with Friends and Family: More than three times a week   Frequency of Social Gatherings with Friends and Family: Twice a week   Attends Religious Services: Never   Marine scientist or Organizations: No   Attends Archivist Meetings: Never   Marital Status: Widowed  Stress: Stress Concern Present   Feeling of Stress : Rather much  Tobacco Use: Low Risk    Smoking Tobacco Use: Never   Smokeless  Tobacco Use: Never   Passive Exposure: Not on file  Transportation Needs: No Transportation Needs   Lack of Transportation (Medical): No   Lack of Transportation (Non-Medical): No    CCM Care Plan  Allergies  Allergen Reactions   Ibuprofen Other (See Comments)    Bleeding events    Medications Reviewed Today     Reviewed by Tomasa Blase, [facility] (Pharmacist) on 07/05/21 at 1135  Med List Status: <None>   Medication Order Taking? Sig Documenting Provider Last Dose Status Informant  acetaminophen (TYLENOL) 500 MG tablet [id number] Yes Take 500 mg by mouth every 6 (six) hours as needed for moderate pain. [provider] Taking Active Self  ALPRAZolam Duanne Moron) 0.25 MG tablet [id number] Yes TAKE 1 TABLET BY MOUTH THREE TIMES A DAY AS NEEDED FOR ANXIETY Biagio Borg, MD Taking Active   amiodarone (PACERONE) 200 MG tablet [id number] Yes Take 1 tablet (200 mg total) by mouth daily. Eileen Stanford, PA-C Taking Active Self  amLODipine (NORVASC) 10 MG tablet [id number] Yes Take 1 tablet (10 mg total) by mouth daily. Dwyane Dee, MD Taking Active   atorvastatin (LIPITOR) 40 MG tablet [id number] Yes TAKE 1 TABLET BY MOUTH EVERY DAY Leonie Man, MD Taking Active Self  blood glucose meter kit and supplies [id number] Yes Dispense based on patient and insurance preference. Use up to four times daily as directed. E11.9 Biagio Borg, MD Taking Active Self  Blood Glucose Monitoring Suppl (ONE TOUCH ULTRA 2) w/Device KIT [id number]  Yes Use as directed daily Corwin Levins, MD Taking Active Self  citalopram (CELEXA) 10 MG tablet [id number] Yes Take 1 tablet (10 mg total) by mouth daily. Corwin Levins, MD Taking Active   cyanocobalamin ([facility] VITAMIN B12) 1000 MCG tablet [id number] Yes TAKE 1 TABLET BY MOUTH EVERY DAY Corwin Levins, MD Taking Active   furosemide (LASIX) 40 MG tablet [id number] Yes TAKE 1 TABLET BY MOUTH EVERY DAY Tonny Bollman, MD Taking Active   iron polysaccharides (NU-IRON) 150  MG capsule [id number] No Take 1 capsule (150 mg total) by mouth 2 (two) times daily.  Patient not taking: Reported on 07/05/2021   Corwin Levins, MD Not Taking Active   Lancets [facility] [place] PLUS [place]) [place] [id number] Yes USE AS DIRECTED TEST 1-2 TIMES A DAY Corwin Levins, MD Taking Active Self  levothyroxine (SYNTHROID) 25 MCG tablet [id number] Yes Take 1 tablet (25 mcg total) by mouth daily. Corwin Levins, MD Taking Active   losartan (COZAAR) 100 MG tablet [id number] Yes Take 1 tablet (100 mg total) by mouth daily. Corwin Levins, MD Taking Active   metFORMIN (GLUCOPHAGE) 1000 MG tablet [id number] Yes TAKE 1 TAB BY MOUTH IN THE AM,AND 1/2 TAB IN THE PM Corwin Levins, MD Taking Active   metoprolol succinate (TOPROL-XL) 25 MG 24 hr tablet [id number] Yes Take 0.5 tablets (12.5 mg total) by mouth daily. Lewie Chamber, MD Taking Active   mirtazapine (REMERON) 15 MG tablet [id number] Yes Take 15 mg by mouth at bedtime. Corwin Levins, MD Taking Active Self  [facility] ULTRA test strip [id number] Yes USE AS INSTRUCTED TEST 1-2 TIMES A DAY Corwin Levins, MD Taking Active Self  pantoprazole (PROTONIX) 40 MG tablet [id number] Yes TAKE 1 TABLET BY MOUTH TWICE A DAY Janetta Hora, PA-C Taking Active   potassium chloride SA (KLOR-CON M) 20 MEQ tablet [id number] Yes Take 1 tablet (20 mEq total) by mouth daily. Tonny Bollman, MD Taking Active             Patient Active Problem List   Diagnosis Date Noted   Hypothyroidism 05/28/2021   Vitamin D deficiency 04/20/2021   Abnormal TSH 04/20/2021   Encounter for well adult exam with abnormal findings 02/16/2021   Long term current use of amiodarone for rate-rhythm control of A. fib 11/28/2020   Chronic respiratory failure (HCC) 11/22/2020   Dehydration 11/02/2020   Diabetes mellitus type 2 in obese (HCC) 11/02/2020   AKI (acute kidney injury) (HCC) 11/01/2020   Transient post TAVR CHB -> not seen on follow-up Zio patch 10/28/2020   S/P TAVR  (transcatheter aortic valve replacement) 10/26/2020   Melena 10/20/2020   Symptomatic anemia 10/19/2020   Acute bronchitis 09/21/2020   Hypercoagulable state due to atrial fibrillation (HCC) 08/23/2020   PAF with RVR: CHA2DS2-VASc 9.  On Eliquis and amiodarone. 08/11/2020   History of CVA (cerebrovascular accident) 08/10/2020   Hypomagnesemia 08/10/2020   Severe aortic stenosis 08/2020   Aortic atherosclerosis (HCC) 03/18/2020   B12 deficiency 09/21/2019   Cough 03/14/2018   Wheezing 03/14/2018   Anxiety 09/11/2017   Osteoporosis 03/10/2016   Coronary artery disease, non-occlusive 05/24/2015   GI bleed    Pulmonary hypertension (HCC)    CHF (congestive heart failure), NYHA class II, chronic, diastolic (HCC) 05/20/2015   Gastric ulceration    AVM (arteriovenous malformation) of colon    Demand ischemia of myocardium (HCC)    Right knee pain 05/21/2014   Anemia, iron deficiency 05/20/2013  MENOPAUSAL DISORDER 12/07/2009   INSOMNIA 09/24/2008   ALLERGIC RHINITIS 06/24/2007   GERD 06/24/2007   COLONIC POLYPS, HX OF 06/24/2007   Hyperlipidemia with target LDL less than 70 10/08/2006   Anxiety with depression 10/08/2006   Essential hypertension 10/08/2006   Peptic ulcer 10/08/2006   Type 2 diabetes mellitus with hyperglycemia, without long-term current use of insulin ([place]) 10/05/2006   Morbid obesity ([place]) 10/05/2006    Immunization History  Administered Date(s) Administered   Fluad Quad(high Dose 65+) 12/26/2018, 12/06/2019, 10/13/2020   Influenza Split 12/21/2010, 12/28/2011   Influenza Whole 12/01/2005, 03/26/2009, 12/07/2009   Influenza, High Dose Seasonal PF 11/19/2012, 11/18/2013, 02/18/2015, 12/28/2015, 12/07/2016, 10/17/2017   PFIZER(Purple Top)SARS-COV-2 Vaccination 05/01/2019, 05/26/2019, 05/12/2020   Pneumococcal Conjugate-13 12/03/2012   Pneumococcal Polysaccharide-23 11/06/2005   Td 09/24/2008   Tdap 09/13/2018, 09/13/2018    Conditions to be  addressed/monitored:  Hypertension, Hyperlipidemia, Diabetes, Atrial Fibrillation, Heart Failure, Coronary Artery Disease, and Anxiety  Care Plan : [place]  Updates made by Tomasa Blase, [place] since 07/05/2021 12:00 AM     Problem: Hypertension, Hyperlipidemia, Diabetes, Atrial Fibrillation, Heart Failure, Coronary Artery Disease, and Anxiety   Priority: High     Long-Range Goal: Disease management   Start Date: 11/22/2020  Expected End Date: 04/22/2022  Recent Progress: On track  Priority: High  Note:   Current Barriers:  Unable to independently monitor therapeutic efficacy  Pharmacist Clinical Goal(s):  Patient will achieve adherence to monitoring guidelines and medication adherence to achieve therapeutic efficacy achieve control of anxiety as evidenced by patient report through collaboration with PharmD and provider.   Interventions: 1:1 collaboration with Biagio Borg, MD regarding development and update of comprehensive plan of care as evidenced by provider attestation and co-signature Inter-disciplinary care team collaboration (see longitudinal plan of care) Comprehensive medication review performed; medication list updated in electronic medical record  Hyperlipidemia: (LDL goal < 70) -Controlled - LDL is at goal; pt endorses compliance with statin and denies issues -Hx stroke (08/2020) and aortic atherosclerosis; coronary CTA w/o evidence of obstructive disease, calcium score 625 Lab Results  Component Value Date   LDLCALC 40 02/16/2021  -Current treatment: Atorvastatin 40 mg daily AM -Educated on Cholesterol goals; Benefits of statin for ASCVD risk reduction; -Recommended to continue current medication  Diabetes (A1c goal <7%) -BG controlled at home, latest A1c above goal - recent BG averaging 129-140 Lab Results  Component Value Date   HGBA1C 7.5 (H) 02/16/2021  -Current medications: Metformin 500mg  BID  -Medications previously tried: glimepiride    -Educated on A1c and blood sugar goals; Complications of diabetes including kidney damage, retinal damage, and cardiovascular disease; -Recommended to take metformin 500mg  BID - will continue to monitor kidney function - metformin will need to be held should eGFR <30  -Patient to continue monitoring blood sugars and reach out should BG average >150 fasted / >180 post prandial   Atrial Fibrillation (Goal: prevent stroke and major bleeding) -Controlled - pt holding eliquis at this time due to low Hgb per cardiology - cardiology advised against watchman procedure - hold [facility] unless otherwise directed  -Dx 08/2020 in s/o acute CVA -Hx complete heart block, aortic stenosis (s/p TAVR 10/26/20) -CHADSVASC: 8 -Current treatment: Amiodarone 200 mg daily AM Metoprolol succinate 25 mg - 1/2 tab daily Eliquis 2.5 mg BID - on hold at this time  -Recommended to continue current medications, continue hold of eliquis unless otherwise directed   Heart Failure / Hypertension (Goal: BP < 130/80  and prevent exacerbations) -Controlled - pt reports compliance with medications as prescribed; she reports weighing herself daily -Current home BP/HR readings: does not monitor at home   BP Readings from Last 3 Encounters:  05/24/21 (!) 148/60  05/02/21 (!) 162/62  04/20/21 130/70   Pulse Readings from Last 3 Encounters:  05/24/21 (!) 57  05/02/21 (!) 57  04/20/21 (!) 57    -Last ejection fraction: 60-65% (Date: 10/26/20) -HF type: Diastolic -Current treatment: Amlodipine 10 mg daily Furosemide 40 mg daily AM Losartan 25 mg daily - HS Metoprolol succinate 25 mg - 1/2 tab daily [place] Con 20 mEq daily AM -Educated on Benefits of medications for managing symptoms and prolonging life; Importance of weighing daily; if you gain more than 3 pounds in one day or 5 pounds in one week, call cardiology; Proper diuretic administration and potassium supplementation; Importance of blood pressure control -Recommended to  continue current medications   Depression/Anxiety (Goal: manage symptoms) -Stable- feels that she is doing better, has been taking xanax as needed (typically twice daily) feels that mirtazapine has been effective for her as well -Current treatment: Alprazolam 0.25 mg TID prn  Mirtazapine $RemoveBefo'15mg'YuJnGfIhvWU$  hs  Citalopram $RemoveBef'10mg'SikTSDbSuf$  daily  -Medications previously tried/failed: clonazepam -Connected with PCP for mental health support -Educated on Benefits of medication for symptom control; Benefits of cognitive-behavioral therapy with or without medication  -Recommended to continue current medications, patient to reach out should she feel mental health is not improving/ under control    GI bleed (Goal: manage symptoms) -Controlled - pt endorses compliance with daily PPI; she denies s/sx of bleeding -Hx GI bleed 09/2020 in s/o Eliquis. Stopped aspirin at that time. -Current treatment  Pantoprazole 40 mg BID -Counseled on benefits of PPI for preventing GI bleed; advised to avoid NSAIDs -Recommended to continue current medication  Hypothyroidism (Goal: Maintenance of euthyroid levels) -Controlled Lab Results  Component Value Date   TSH 5.37 05/24/2021  -Current treatment  Levothyroxine 50mcg daily  -Medications previously tried: n/a  -Recommended to continue current medication  Iron Deficiency Anemia (Goal: Maintenance of adequate HgB/ Iron levels ) -Not ideally controlled -Current treatment  Nu-Iron $Remove'150mg'HjBpCUw$  daily  - not taking - unable to tolerate - causes diarrhea  -Medications previously tried: n/a  -Recommended to continue current medication  Health Maintenance -Vaccine gaps: COVID booster -Current therapy:  Tylenol 500 mg BID Vitamin B12 1000 mcg daily -Recommended to continue current medication  Patient Goals/Self-Care Activities Patient will:  - take medications as prescribed - reduce carb intake - continue to monitor BG  focus on medication adherence by pill box weigh daily, and contact  provider if weight gain of 5+ lbs in a week, contact cardiology      Medication Assistance: None required.  Patient affirms current coverage meets needs.  Care Gaps: Eye exam  COVID booster  Foot Exam  Patient's preferred pharmacy is:  [facility] [place], [place] - [address] [place] [address] Phone: [phone] Fax: [phone]  [id number] [place], [place] [address] [place] [address] Phone: [phone] Fax: [phone]  [facility] [address] [place] [address] Phone: [phone] Fax: [phone]   Uses pill box? Yes Pt endorses 100% compliance  Care Plan and Follow Up Patient Decision:  Patient agrees to Care Plan and Follow-up.  Plan: Telephone follow up appointment with care management team member  scheduled for:  3 months The patient has been provided with contact information for the care management team and has been advised to call with any health related questions or concerns.    Tomasa Blase, PharmD Clinical Pharmacist, [place]

## 2021-07-05 NOTE — Patient Instructions (Signed)
Visit Information  Following are the goals we discussed today:   Manage My Medicine   Timeframe:  Long-Range Goal Priority:  High Start Date:       11/22/20                      Expected End Date:    04/22/2022                  Follow Up Date 09/2021   - call for medicine refill 2 or 3 days before it runs out - call if I am sick and can't take my medicine - keep a list of all the medicines I take; vitamins and herbals too - use a pillbox to sort medicine  -weigh daily, and contact provider if weight gain of 5+ lbs in a week, contact cardiology   Why is this important?   These steps will help you keep on track with your medicines.  Plan: Telephone follow up appointment with care management team member scheduled for:  3 months The patient has been provided with contact information for the care management team and has been advised to call with any health related questions or concerns.   Tomasa Blase, PharmD Clinical Pharmacist, [facility]   Please call the care guide team at [phone] if you need to cancel or reschedule your appointment.   The patient verbalized understanding of instructions, educational materials, and care plan provided today and DECLINED offer to receive copy of patient instructions, educational materials, and care plan.

## 2021-07-06 DIAGNOSIS — D509 Iron deficiency anemia, unspecified: Secondary | ICD-10-CM

## 2021-07-06 DIAGNOSIS — I4891 Unspecified atrial fibrillation: Secondary | ICD-10-CM

## 2021-07-06 DIAGNOSIS — E039 Hypothyroidism, unspecified: Secondary | ICD-10-CM | POA: Diagnosis not present

## 2021-07-06 DIAGNOSIS — F32A Depression, unspecified: Secondary | ICD-10-CM | POA: Diagnosis not present

## 2021-07-06 DIAGNOSIS — Z7984 Long term (current) use of oral hypoglycemic drugs: Secondary | ICD-10-CM | POA: Diagnosis not present

## 2021-07-06 DIAGNOSIS — E785 Hyperlipidemia, unspecified: Secondary | ICD-10-CM

## 2021-07-06 DIAGNOSIS — I509 Heart failure, unspecified: Secondary | ICD-10-CM | POA: Diagnosis not present

## 2021-07-06 DIAGNOSIS — E1159 Type 2 diabetes mellitus with other circulatory complications: Secondary | ICD-10-CM | POA: Diagnosis not present

## 2021-07-06 DIAGNOSIS — I110 Hypertensive heart disease with heart failure: Secondary | ICD-10-CM | POA: Diagnosis not present

## 2021-07-07 ENCOUNTER — Other Ambulatory Visit: Payer: Self-pay | Admitting: Internal Medicine

## 2021-07-07 ENCOUNTER — Telehealth: Payer: Self-pay | Admitting: Pharmacy Technician

## 2021-07-07 MED ORDER — METOPROLOL SUCCINATE ER 25 MG PO TB24: 12.5000 mg | ORAL_TABLET | Freq: Every day | ORAL | 3 refills | Status: DC

## 2021-07-07 NOTE — Telephone Encounter (Signed)
Auth Submission: NO AUTH NEEDED Payer: [employer] MEDICARE Medication & CPT/J Code(s) submitted: Feraheme (ferumoxytol) [id number] Route of submission (phone, fax, portal): PHONE Auth type: Buy/Bill Units/visits requested: [id number] Reference number: [id number] Approval from: 07/07/21 to 07/08/22  Patient will be scheduled as soon as possible

## 2021-07-07 NOTE — Telephone Encounter (Signed)
Ok to let pt know  1)  metoprolol XL refill done erx  2)  on the suggestion of our pharmacist, I have ordered IV iron since she has trouble with the oral  -   hopefully she will be called soon

## 2021-07-11 ENCOUNTER — Ambulatory Visit (INDEPENDENT_AMBULATORY_CARE_PROVIDER_SITE_OTHER): Payer: Medicare Other | Admitting: *Deleted

## 2021-07-11 VITALS — BP 162/72 | HR 53 | Temp 97.7°F | Resp 16 | Ht 61.0 in | Wt 183.0 lb

## 2021-07-11 DIAGNOSIS — D500 Iron deficiency anemia secondary to blood loss (chronic): Secondary | ICD-10-CM | POA: Diagnosis not present

## 2021-07-11 MED ORDER — SODIUM CHLORIDE 0.9 % IV SOLN
510.0000 mg | Freq: Once | INTRAVENOUS | Status: AC
  Administered 2021-07-11: 510 mg via INTRAVENOUS
  Filled 2021-07-11: qty 17

## 2021-07-11 NOTE — Progress Notes (Signed)
Diagnosis: Iron Deficiency Anemia  Provider:  Marshell Garfinkel, MD  Procedure: Infusion  IV Type: Peripheral, IV Location: R Antecubital  Feraheme (Ferumoxytol), Dose: 510 mg  Infusion Start Time: 1336 pm  Infusion Stop Time: 1355 pm  Post Infusion IV Care: Observation period completed and Peripheral IV Discontinued  Discharge: Condition: Good, Destination: Home . AVS provided to patient.   Performed by:  Oren Beckmann, RN

## 2021-07-11 NOTE — Patient Instructions (Signed)

## 2021-07-18 ENCOUNTER — Ambulatory Visit (INDEPENDENT_AMBULATORY_CARE_PROVIDER_SITE_OTHER): Payer: Medicare Other

## 2021-07-18 VITALS — BP 147/72 | HR 56 | Temp 97.8°F | Resp 18 | Ht 61.0 in | Wt 187.2 lb

## 2021-07-18 DIAGNOSIS — D500 Iron deficiency anemia secondary to blood loss (chronic): Secondary | ICD-10-CM | POA: Diagnosis not present

## 2021-07-18 MED ORDER — SODIUM CHLORIDE 0.9 % IV SOLN
510.0000 mg | Freq: Once | INTRAVENOUS | Status: AC
  Administered 2021-07-18: 510 mg via INTRAVENOUS
  Filled 2021-07-18: qty 17

## 2021-07-18 NOTE — Progress Notes (Signed)
Diagnosis: Iron Deficiency Anemia  Provider:  Chilton Greathouse, MD  Procedure: Infusion  IV Type: Peripheral, IV Location: R Antecubital  Feraheme (Ferumoxytol), Dose: 510 mg  Infusion Start Time: 1340  Infusion Stop Time: 1400  Post Infusion IV Care: Peripheral IV Discontinued  Discharge: Condition: Good, Destination: Home . AVS provided to patient.   Performed by:  Loney Hering, LPN

## 2021-08-03 ENCOUNTER — Ambulatory Visit (INDEPENDENT_AMBULATORY_CARE_PROVIDER_SITE_OTHER): Payer: Medicare Other | Admitting: *Deleted

## 2021-08-03 ENCOUNTER — Encounter: Payer: Self-pay | Admitting: *Deleted

## 2021-08-03 DIAGNOSIS — E119 Type 2 diabetes mellitus without complications: Secondary | ICD-10-CM

## 2021-08-03 DIAGNOSIS — I480 Paroxysmal atrial fibrillation: Secondary | ICD-10-CM

## 2021-08-03 DIAGNOSIS — I5032 Chronic diastolic (congestive) heart failure: Secondary | ICD-10-CM

## 2021-08-03 NOTE — Chronic Care Management (AMB) (Signed)
Chronic Care Management   CCM RN Visit Note  08/03/2021 Name: Mary Mccarthy MRN: [id number] DOB: Apr 23, 1940  Subjective: Mary Mccarthy is a 81 y.o. year old female who is a primary care patient of Biagio Borg, MD. The care management team was consulted for assistance with disease management and care coordination needs.    Engaged with patient by telephone for follow up visit/ CCM RN CM case closure in response to provider referral for case management and/or care coordination services.   Consent to Services:  The patient was given information about Chronic Care Management services, agreed to services, and gave verbal consent prior to initiation of services.  Please see initial visit note for detailed documentation.  Patient agreed to services and verbal consent obtained.   Assessment: Review of patient past medical history, allergies, medications, health status, including review of consultants reports, laboratory and other test data, was performed as part of comprehensive evaluation and provision of chronic care management services.   SDOH (Social Determinants of Health) assessments and interventions performed:  SDOH Interventions    Flowsheet Row Most Recent Value  SDOH Interventions   Food Insecurity Interventions Intervention Not Indicated  [continues to deny food insecurity]  Transportation Interventions Intervention Not Indicated  [family/ nephew continues providing transportation]     CCM Care Plan  Allergies  Allergen Reactions   Ibuprofen Other (See Comments)    Bleeding events   Outpatient Encounter Medications as of 08/03/2021  Medication Sig   acetaminophen (TYLENOL) 500 MG tablet Take 500 mg by mouth every 6 (six) hours as needed for moderate pain.   ALPRAZolam (XANAX) 0.25 MG tablet TAKE 1 TABLET BY MOUTH THREE TIMES A DAY AS NEEDED FOR ANXIETY   amiodarone (PACERONE) 200 MG tablet Take 1 tablet (200 mg total) by mouth daily.   amLODipine (NORVASC) 10 MG tablet  Take 1 tablet (10 mg total) by mouth daily.   atorvastatin (LIPITOR) 40 MG tablet TAKE 1 TABLET BY MOUTH EVERY DAY   blood glucose meter kit and supplies Dispense based on patient and insurance preference. Use up to four times daily as directed. E11.9   Blood Glucose Monitoring Suppl (ONE Mccarthy ULTRA 2) w/Device KIT Use as directed daily   citalopram (CELEXA) 10 MG tablet Take 1 tablet (10 mg total) by mouth daily.   cyanocobalamin ([employer] VITAMIN B12) 1000 MCG tablet TAKE 1 TABLET BY MOUTH EVERY DAY   furosemide (LASIX) 40 MG tablet TAKE 1 TABLET BY MOUTH EVERY DAY   iron polysaccharides (NU-IRON) 150 MG capsule Take 1 capsule (150 mg total) by mouth 2 (two) times daily. (Patient not taking: Reported on 07/05/2021)   Lancets (ONETOUCH DELICA PLUS [id number]) MISC USE AS DIRECTED TEST 1-2 TIMES A DAY   levothyroxine (SYNTHROID) 25 MCG tablet Take 1 tablet (25 mcg total) by mouth daily.   losartan (COZAAR) 100 MG tablet Take 1 tablet (100 mg total) by mouth daily.   metFORMIN (GLUCOPHAGE) 1000 MG tablet TAKE 1 TAB BY MOUTH IN THE AM,AND 1/2 TAB IN THE PM (Patient taking differently: Take 500 mg by mouth 2 (two) times daily with a meal. TAKE 1 TAB BY MOUTH IN THE AM,AND 1/2 TAB IN THE PM)   metoprolol succinate (TOPROL-XL) 25 MG 24 hr tablet Take 0.5 tablets (12.5 mg total) by mouth daily.   mirtazapine (REMERON) 15 MG tablet Take 15 mg by mouth at bedtime.   ONETOUCH ULTRA test strip USE AS INSTRUCTED TEST 1-2 TIMES A DAY   pantoprazole (PROTONIX) 40  MG tablet TAKE 1 TABLET BY MOUTH TWICE A DAY   potassium chloride SA (KLOR-CON M) 20 MEQ tablet Take 1 tablet (20 mEq total) by mouth daily.   No facility-administered encounter medications on file as of 08/03/2021.   Patient Active Problem List   Diagnosis Date Noted   Hypothyroidism 05/28/2021   Vitamin D deficiency 04/20/2021   Abnormal TSH 04/20/2021   Encounter for well adult exam with abnormal findings 02/16/2021   Long term current use of  amiodarone for rate-rhythm control of A. fib 11/28/2020   Chronic respiratory failure ([place]) 11/22/2020   Dehydration 11/02/2020   Diabetes mellitus type 2 in obese ([place]) 11/02/2020   AKI (acute kidney injury) ([place]) 11/01/2020   Transient post TAVR CHB -> not seen on follow-up Zio patch 10/28/2020   S/P TAVR (transcatheter aortic valve replacement) 10/26/2020   Melena 10/20/2020   Symptomatic anemia 10/19/2020   Acute bronchitis 09/21/2020   Hypercoagulable state due to atrial fibrillation ([place]) 08/23/2020   PAF with RVR: CHA2DS2-VASc 9.  On Eliquis and amiodarone. 08/11/2020   History of CVA (cerebrovascular accident) 08/10/2020   Hypomagnesemia 08/10/2020   Severe aortic stenosis 08/2020   Aortic atherosclerosis ([place]) 03/18/2020   B12 deficiency 09/21/2019   Cough 03/14/2018   Wheezing 03/14/2018   Anxiety 09/11/2017   Osteoporosis 03/10/2016   Coronary artery disease, non-occlusive 05/24/2015   GI bleed    Pulmonary hypertension (HCC)    CHF (congestive heart failure), NYHA class II, chronic, diastolic ([place]) 62/04/5595   Gastric ulceration    AVM (arteriovenous malformation) of colon    Demand ischemia of myocardium (HCC)    Right knee pain 05/21/2014   Anemia, iron deficiency 05/20/2013   MENOPAUSAL DISORDER 12/07/2009   INSOMNIA 09/24/2008   ALLERGIC RHINITIS 06/24/2007   GERD 06/24/2007   COLONIC POLYPS, HX OF 06/24/2007   Hyperlipidemia with target LDL less than 70 10/08/2006   Anxiety with depression 10/08/2006   Essential hypertension 10/08/2006   Peptic ulcer 10/08/2006   Type 2 diabetes mellitus with hyperglycemia, without long-term current use of insulin ([place]) 10/05/2006   Morbid obesity ([place] 10/05/2006   Conditions to be addressed/monitored:  CHF and DMII  Care Plan : RN Care Manager Plan of Care  Updates made by Knox Royalty, RN since 08/03/2021 12:00 AM     Problem: Chronic Disease Management Needs   Priority: High     Long-Range Goal: Ongoing  adherence to established plan of care for long term chronic disease management   Start Date: 11/17/2020  Expected End Date: 11/17/2021  Priority: High  Note:   Current Barriers:  Chronic Disease Management support and education needs related to CHF and DMII Lives with son- who works during daytime hours: alone during day, limited stamina to perform ADL's/ iADL's after multiple recent hospitalizations Fragile state of health, multiple progressing chronic health conditions Confusion around multiple new medications, post- recent hospitalizations- 12/06/20- confirmed patient has spoken with CCM Pharmacy team; 02/21/21- confirmed CCM pharmacy team active 12/06/20: Patient has received home oxygen, however, she reports not using: states oxygenator is "too loud" and keeps her awake: 01/19/21: Now resolved 12/20/20: reports unable to get through to [facility]: care coordination outreach placed on patient's behalf 01/19/21: reports [employer] came out to her home after last care coordination outreach and replaced the oxygenator- reports "it is much better and much more quiet; I am using it occasionally" 03/30/21: patient reports "rarely" uses home O2; she is considering having this returned to company 05/10/21: patient  reports considering buying her own portable home O2 system: verbalizes plan to discuss with Dr. Jenny Reichmann at upcoming scheduled office visit 05/24/21 05/24/21: confirmed that home O2 was discontinued by PCP during office visit 05/24/21-- patient reports [employer] has not yet picked up home equipment; pt. has contacted PCP office and was told that the discontinue order was faxed to [facility] post-PCP office visit; encouraged patient to contact [employer] to obtain information on when they expect to pick up equipment 08/03/21: confirms [employer] has picked up home oxygen and confirms this issue is now resolved Multiple recent inpatient hospitalizations: 08/03/21: confirmed no new/ recent unplanned hospital admissions since  last discharge 11/04/20 August 22-26, 2022: CHF September 12-22, 2022: AF with RVR; CHF; TAVR September 26-29, 2022: N/V, AKI  RNCM Clinical Goal(s):  Patient will verbalize basic understanding of CHF and DMII disease process and self health management plan CHF; A-Fib; DMII  through collaboration with RN Care manager, provider, and care team.   Interventions: 1:1 collaboration with primary care provider regarding development and update of comprehensive plan of care as evidenced by provider attestation and co-signature Inter-disciplinary care team collaboration (see longitudinal plan of care) Evaluation of current treatment plan related to  self management and patient's adherence to plan as established by provider Review of patient status, including review of consultants reports, relevant laboratory and other test results, and medications completed Pain assessment updated: denies pain today Medications discussed: reports continues to independently self-manage and denies current concerns/ issues/ questions around medications; endorses adherence to taking all medications as prescribed Reviewed upcoming scheduled provider appointments: 09/23/21- PCP office visit; 11/08/21- ECHO; 01/02/22- cardiology provider; patient confirms is aware of all and has plans to attend as scheduled Discussed plans with patient for ongoing care management follow up- patient denies current care coordination/ care management needs and is agreeable to CCM RN CM case closure today; verbalizes understanding to contact PCP or other care providers for any needs that arise in the future, and confirms he has contact information for all care providers     Heart Failure Interventions:  (Status: 08/03/21: Goal Met.) Long term Goal Discussed importance of daily weight and advised patient to weigh and record daily Reviewed role of diuretics in prevention of fluid overload and management of heart failure Reviewed recent weights at home:  reports consistently between 180- 183 lbs, with weight this morning of "182 lbs;" denies weight gain > 3 lbs overnight/ 5 lbs in one week Reinforced previously provided education monitoring daily weights/ breathing status at home, and to follow action plan for yellow CHF zone: patient verbalizes good understanding of same today Discussed current clinical condition: states "I am doing so much better; finally I am having no problems and no issues;" reports "breathing is fine;" denies peripheral swelling, able to verbalize good understanding today of action plan for shortness of breath with activity, which is her baseline-- stop and rest- she reports "this is what helps the most;" and denies any recent episodes of SOB that were unmanageable Confirmed patient continues taking diuretic as prescribed Confirms no recent signs/ symptoms A-Fib-- previously provided education around signs/ symptoms along with corresponding action plan reinforced, need to promptly notify cardiology provider if she believes she may be in A-Fib; discussed reasons to seek urgent/ emergent care and she verbalizes understanding and agreement  Diabetes:  (Status: 08/03/21: Goal Met.) Long term Goal Lab Results  Component Value Date   HGBA1C 7.5 (H) 02/16/2021  Provided education to patient about basic DM disease process; Confirmed patient continues  monitoring/ recording blood sugars at home BID: fasting and post-prandial; reports today she has primarily been monitoring fasting blood sugars Reviewed with patient recent fasting blood sugars at home: today reports general ranges between 120- 140, continues to deny recent low blood sugars/ signs/ symptoms hypoglycemia    Plan: No further follow up required: patient denies current care coordination/ care management needs and is agreeable to CCM RN CM case closure today; CCM RN CM case closure accordingly     [name], RN, BSN, [place] [phone]: direct office

## 2021-08-05 DIAGNOSIS — E1159 Type 2 diabetes mellitus with other circulatory complications: Secondary | ICD-10-CM

## 2021-08-05 DIAGNOSIS — I509 Heart failure, unspecified: Secondary | ICD-10-CM

## 2021-08-30 ENCOUNTER — Other Ambulatory Visit: Payer: Self-pay | Admitting: Physician Assistant

## 2021-08-30 DIAGNOSIS — Z952 Presence of prosthetic heart valve: Secondary | ICD-10-CM

## 2021-09-06 ENCOUNTER — Other Ambulatory Visit: Payer: Self-pay | Admitting: Cardiology

## 2021-09-06 DIAGNOSIS — I2510 Atherosclerotic heart disease of native coronary artery without angina pectoris: Secondary | ICD-10-CM

## 2021-09-06 DIAGNOSIS — E785 Hyperlipidemia, unspecified: Secondary | ICD-10-CM

## 2021-09-23 ENCOUNTER — Ambulatory Visit (INDEPENDENT_AMBULATORY_CARE_PROVIDER_SITE_OTHER): Payer: Medicare Other | Admitting: Internal Medicine

## 2021-09-23 VITALS — BP 138/72 | HR 66 | Temp 98.2°F | Ht 61.0 in | Wt 184.0 lb

## 2021-09-23 DIAGNOSIS — E119 Type 2 diabetes mellitus without complications: Secondary | ICD-10-CM | POA: Diagnosis not present

## 2021-09-23 DIAGNOSIS — E785 Hyperlipidemia, unspecified: Secondary | ICD-10-CM | POA: Diagnosis not present

## 2021-09-23 DIAGNOSIS — E559 Vitamin D deficiency, unspecified: Secondary | ICD-10-CM | POA: Diagnosis not present

## 2021-09-23 DIAGNOSIS — E039 Hypothyroidism, unspecified: Secondary | ICD-10-CM

## 2021-09-23 DIAGNOSIS — E538 Deficiency of other specified B group vitamins: Secondary | ICD-10-CM

## 2021-09-23 DIAGNOSIS — I10 Essential (primary) hypertension: Secondary | ICD-10-CM | POA: Diagnosis not present

## 2021-09-23 LAB — HEPATIC FUNCTION PANEL
ALT: 22 U/L (ref 0–35)
AST: 26 U/L (ref 0–37)
Albumin: 3.9 g/dL (ref 3.5–5.2)
Alkaline Phosphatase: 55 U/L (ref 39–117)
Bilirubin, Direct: 0.2 mg/dL (ref 0.0–0.3)
Total Bilirubin: 0.6 mg/dL (ref 0.2–1.2)
Total Protein: 7.2 g/dL (ref 6.0–8.3)

## 2021-09-23 LAB — LIPID PANEL
Cholesterol: 98 mg/dL (ref 0–200)
HDL: 35.9 mg/dL — ABNORMAL LOW (ref >39.00)
LDL Cholesterol: 45 mg/dL (ref 0–99)
NonHDL: 61.99
Total CHOL/HDL Ratio: 3
Triglycerides: 87 mg/dL (ref 0.0–149.0)
VLDL: 17.4 mg/dL (ref 0.0–40.0)

## 2021-09-23 LAB — BASIC METABOLIC PANEL
BUN: 42 mg/dL — ABNORMAL HIGH (ref 6–23)
CO2: 29 mEq/L (ref 19–32)
Calcium: 10.2 mg/dL (ref 8.4–10.5)
Chloride: 106 mEq/L (ref 96–112)
Creatinine, Ser: 2.09 mg/dL — ABNORMAL HIGH (ref 0.40–1.20)
GFR: 21.59 mL/min — ABNORMAL LOW (ref >60.00)
Glucose, Bld: 108 mg/dL — ABNORMAL HIGH (ref 70–99)
Potassium: 4.8 mEq/L (ref 3.5–5.1)
Sodium: 141 mEq/L (ref 135–145)

## 2021-09-23 LAB — T4, FREE: Free T4: 1.12 ng/dL (ref 0.60–1.60)

## 2021-09-23 LAB — VITAMIN B12: Vitamin B-12: 1063 pg/mL — ABNORMAL HIGH (ref 211–911)

## 2021-09-23 LAB — HEMOGLOBIN A1C: Hgb A1c MFr Bld: 6.5 % (ref 4.6–6.5)

## 2021-09-23 LAB — VITAMIN D 25 HYDROXY (VIT D DEFICIENCY, FRACTURES): VITD: 15.49 ng/mL — ABNORMAL LOW (ref 30.00–100.00)

## 2021-09-23 LAB — TSH: TSH: 5.47 u[IU]/mL (ref 0.35–5.50)

## 2021-09-23 NOTE — Progress Notes (Unsigned)
Patient ID: Mary Mccarthy, female   DOB: 1940/11/29, 81 y.o.   MRN: [id number]        Chief Complaint: follow up HTN, HLD and hyperglycemia ***       HPI:  Mary Mccarthy is a 81 y.o. female here with c/o        Wt Readings from Last 3 Encounters:  09/23/21 184 lb (83.5 kg)  07/18/21 187 lb 3.2 oz (84.9 kg)  07/11/21 183 lb (83 kg)   BP Readings from Last 3 Encounters:  09/23/21 138/72  07/18/21 (!) 147/72  07/11/21 (!) 162/72         Past Medical History:  Diagnosis Date   ALLERGIC RHINITIS 06/24/2007   Anemia    AVM (arteriovenous malformation) of colon    Blood transfusion without reported diagnosis 05/2015   COLONIC POLYPS, HX OF 06/24/2007   Coronary artery disease, non-occlusive 05/2015   Trop + w/ Acute Anemia =>CATH: small RI - Ostial 60%, ostial RCA 30% and dLAD 40-50%;; 9/'21: Cor Ca Score 624. Mild (25-49%) prox RCA & LAD,; Moderate (50-69%) Ostial Small RI & prox LCx.     DEPRESSION 10/08/2006   DIABETES MELLITUS, TYPE II 10/05/2006   GERD 06/24/2007   History of CVA (cerebrovascular accident) 08/10/2020   08/2020- found to be in afib with RVR, started on Eliquis (shower of emboli from A. fib as complication of severe AS)   HYPERLIPIDEMIA 10/08/2006   HYPERTENSION 10/08/2006   INSOMNIA 09/24/2008   Left knee DJD    Nonsustained paroxysmal ventricular tachycardia ([place]) 10/2018   Post-TAVR Zio Patch: Frequent (206) runs of NSVT - Fastest 5 beats 239 bpm. Longest 9.3 Sec avg 135 bpm. (? possibly Afib w/ aberrancy)   Osteoporosis 03/10/2016   PAF (paroxysmal atrial fibrillation) (HCC)    PAF with RVR: CHA2DS2-VASc 9.  On Eliquis and amiodarone. 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 Angelena Form & Dr. Cyndia Bent   Severe aortic stenosis 08/2020   Progression from Mod-Severe AS to Severe AS - noted on Echo 08/2020 -> (mean gradient  progressed from 34 to 42 mmHg) this was in setting of new onset A. fib RVR, acute diastolic HF and shower of emboli CVA. ->  Referred for TAVR, completed 10/26/2020   Past Surgical History:  Procedure Laterality Date   APPENDECTOMY     BIOPSY  10/21/2020   Procedure: BIOPSY;  Surgeon: Sharyn Creamer, MD;  Location: [facility] ENDOSCOPY;  Service: Gastroenterology;;   BREAST BIOPSY     CARDIAC CATHETERIZATION N/A 05/21/2015   Procedure: Left Heart Cath and Coronary Angiography;  Surgeon: Leonie Man, MD;  Location: [place] CV LAB;  Service: Cardiovascular;: Ost RI 60%, Ost RCA 30%, dLAD tapers to small vessel w/ 40-50%. Mildly elevated LVEDP. Normal LV Fxn.   COLONOSCOPY N/A 05/20/2015   Procedure: COLONOSCOPY;  Surgeon: Manus Gunning, MD;  Location: [place];  Service: Gastroenterology;  Laterality: N/A;   CORONARY CA2+ SCORE / CARDIAC CT ANGIOGRAM  10/09/2019   Calcium score 624.  82nd percentile. Dominant RCA: Mild (25-49%) proximal stenosis-distal bifurcation into PDA and PAV--< RPL branches.  LAD (1 major mid vessel diagonal) diffuse calcified plaque, mild proximal stenosis with minimal distal stenosis.  Small RI moderate ostial disease.  LCx-moderate mixed (50-69%) proximal stenosis.  Small dOM1 disease.  Trileaflet AoV, annular Ca2+ - probable AS  ESOPHAGOGASTRODUODENOSCOPY N/A 05/20/2015   Procedure: ESOPHAGOGASTRODUODENOSCOPY (EGD);  Surgeon: Manus Gunning, MD;  Location: [facility];  Service: Gastroenterology;  Laterality: N/A;   ESOPHAGOGASTRODUODENOSCOPY (EGD) WITH PROPOFOL N/A 10/21/2020   Procedure: ESOPHAGOGASTRODUODENOSCOPY (EGD) WITH PROPOFOL;  Surgeon: Sharyn Creamer, MD;  Location: [facility];  Service: Gastroenterology;  Laterality: N/A;   INTRAOPERATIVE TRANSTHORACIC ECHOCARDIOGRAM N/A 10/26/2020   Procedure: INTRAOPERATIVE TRANSTHORACIC ECHOCARDIOGRAM;  Surgeon: Angelena Form; Location: [facility] OR; Pre-TAVR well-visualized calcified AoV mean Grad 37 mmHg, AVA  0.72 cm => Post TAVR well-positioned supra-annular 26 mm Medtronic Evolut Pro valve placed with no PVL.  Mean gradient 14 mmHg.  AVR 1.58 cm.  Normal flow to RCA and LM-LAD.  ; EF 60 to 65%.  Degenerative severe MAC w/ mild MR.   RIGHT/LEFT HEART CATH AND CORONARY ANGIOGRAPHY N/A 09/29/2020   Procedure: RIGHT/LEFT HEART CATH AND CORONARY ANGIOGRAPHY;  Surgeon: Burnell Blanks, MD;  Location: [place] CV LAB;  Service: Cardiovascular; pre-TAVR:  pRCA 20%, m-dRCA 30%. D LM-pLAD 20%. RI 30%, mLAD 20%.  Severe AS with mean gradient measured at 52.8 mmHg.   TONSILLECTOMY     TRANSCATHETER AORTIC VALVE REPLACEMENT, TRANSFEMORAL N/A 10/26/2020   Procedure: TRANSCATHETER AORTIC VALVE REPLACEMENT, TRANSFEMORAL;  Surgeon: Burnell Blanks, MD;  Location: [facility] OR;  Service: Open Heart Surgery;; Medtronic Evolut-Pro + (size 26 mm, model # EVPROPLUS -26US, serial # [id number]); transient high-grade AV block noted so PPM left in place.   TRANSTHORACIC ECHOCARDIOGRAM  03/17/2019   a) 03/2019: EF 60 to 65%.  Moderate LVH.  GRII DD.  Mod-Severe AS (m grad 36 mmHg, peak 59 mmHg); b) 09/2019: EF 65-70%, No RWMA. Gr1 DD/hi LAP, Mild hi PAP. Mod LA Dil. MOD AS (mean Grad 34.5 mmHg).  STABLE   TRANSTHORACIC ECHOCARDIOGRAM  08/27/2020   Admitted for CVA/Afib RVR & CHF (Progression to SEVERE SYMPTOMATIC AS):  Severe Calcific Aortic Stenosis (VTI AVA estimated 0.86 cm, mean gradient 42 mmHg, V-max 4.36 m/s).  EF 55 to 60%.  Severe concentric LVH.  Unable to determine diastolic parameters.  Moderately elevated PAP.  Mild LA dilation.  Mild circumferential pericardial effusion.  Trivial MR.  Severe MAC.   TRANSTHORACIC ECHOCARDIOGRAM  11/25/2020   1 Month s/p TAVR: EF 55 to 60%.  No R WMA.  GR 1 DD.  Elevated LVEDP.  Normal RV size with mildly elevated RVP estimated 44 mmHg.  Oscillating density in the RV suspect calcified chordal apparatus versus calcified thrombus.  Moderate LA dilation.  Normal IVC/RA P =>  Well-positioned 26 mm Medtronic Evolut Pro THVwith mean AOV gradient 11 mmHg.  Trivial PVL   TRANSTHORACIC ECHOCARDIOGRAM  11/02/2020   a) Day 1 Post-op TAVR 10/27/20:  Well-Positioned Supra Annular Medtronic Evolut Pro THP.  Mean gradient 12 mmHg, peak 24 mmHg.  AVA 1.8 cm.  Trivial PVL.  EF 60 to 65%.  Normal LV function.  Mild LVH.  Severe LA dilation.  Mild RA dilation.  Severe MAC;; b) 11/02/20: Normal structure and function of the aortic valve prosthesis.  Mean gradient 9 mmHg (otherwise stable)   TUBAL LIGATION      reports that she has never smoked. She has never used smokeless tobacco. She reports that she does not drink alcohol and does not use drugs. family history includes Alcohol abuse in an other family member; Arrhythmia in her sister; Arthritis in an other family member; CAD in her sister; Cancer in her mother; Depression in an other family member; Diabetes in an other family member; Heart attack (  age of onset: 24) in her sister; Heart disease in an other family member; Hyperlipidemia in an other family member; Hypertension in an other family member; Stroke in an other family member. Allergies  Allergen Reactions   Ibuprofen Other (See Comments)    Bleeding events   Current Outpatient Medications on File Prior to Visit  Medication Sig Dispense Refill   acetaminophen (TYLENOL) 500 MG tablet Take 500 mg by mouth every 6 (six) hours as needed for moderate pain.     ALPRAZolam (XANAX) 0.25 MG tablet TAKE 1 TABLET BY MOUTH THREE TIMES A DAY AS NEEDED FOR ANXIETY 60 tablet 2   amiodarone (PACERONE) 200 MG tablet Take 1 tablet (200 mg total) by mouth daily. 90 tablet 6   amLODipine (NORVASC) 10 MG tablet Take 1 tablet (10 mg total) by mouth daily. 90 tablet 3   atorvastatin (LIPITOR) 40 MG tablet TAKE 1 TABLET BY MOUTH EVERY DAY 90 tablet 3   blood glucose meter kit and supplies Dispense based on patient and insurance preference. Use up to four times daily as directed. E11.9 1 each 0    Blood Glucose Monitoring Suppl (ONE TOUCH ULTRA 2) w/Device KIT Use as directed daily 1 each 0   citalopram (CELEXA) 10 MG tablet Take 1 tablet (10 mg total) by mouth daily. 90 tablet 3   cyanocobalamin ([employer] VITAMIN B12) 1000 MCG tablet TAKE 1 TABLET BY MOUTH EVERY DAY 90 tablet 3   furosemide (LASIX) 40 MG tablet TAKE 1 TABLET BY MOUTH EVERY DAY 90 tablet 3   iron polysaccharides (NU-IRON) 150 MG capsule Take 1 capsule (150 mg total) by mouth 2 (two) times daily. 180 capsule 1   Lancets (ONETOUCH DELICA PLUS [id number]) MISC USE AS DIRECTED TEST 1-2 TIMES A DAY 100 each 23   levothyroxine (SYNTHROID) 25 MCG tablet Take 1 tablet (25 mcg total) by mouth daily. 90 tablet 3   losartan (COZAAR) 100 MG tablet Take 1 tablet (100 mg total) by mouth daily. 90 tablet 3   metFORMIN (GLUCOPHAGE) 1000 MG tablet TAKE 1 TAB BY MOUTH IN THE AM,AND 1/2 TAB IN THE PM (Patient taking differently: Take 500 mg by mouth 2 (two) times daily with a meal. TAKE 1 TAB BY MOUTH IN THE AM,AND 1/2 TAB IN THE PM) 135 tablet 1   metoprolol succinate (TOPROL-XL) 25 MG 24 hr tablet Take 0.5 tablets (12.5 mg total) by mouth daily. 45 tablet 3   mirtazapine (REMERON) 15 MG tablet Take 15 mg by mouth at bedtime.     ONETOUCH ULTRA test strip USE AS INSTRUCTED TEST 1-2 TIMES A DAY 100 strip 23   pantoprazole (PROTONIX) 40 MG tablet TAKE 1 TABLET BY MOUTH TWICE A DAY 180 tablet 3   potassium chloride SA (KLOR-CON M) 20 MEQ tablet Take 1 tablet (20 mEq total) by mouth daily. 90 tablet 3   No current facility-administered medications on file prior to visit.        ROS:  All others reviewed and negative.  Objective        PE:  BP 138/72 (BP Location: Right Arm, Patient Position: Sitting, Cuff Size: Large)   Pulse 66   Temp 98.2 F (36.8 C) (Oral)   Ht _0  (1.549 m)   Wt 184 lb (83.5 kg)   SpO2 94%   BMI 34.77 kg/m                 Constitutional: Pt appears in NAD  HENT: Head: NCAT.                Right Ear:  External ear normal.                 Left Ear: External ear normal.                Eyes: . Pupils are equal, round, and reactive to light. Conjunctivae and EOM are normal               Nose: without d/c or deformity               Neck: Neck supple. Gross normal ROM               Cardiovascular: Normal rate and regular rhythm.                 Pulmonary/Chest: Effort normal and breath sounds without rales or wheezing.                Abd:  Soft, NT, ND, + BS, no organomegaly               Neurological: Pt is alert. At baseline orientation, motor grossly intact               Skin: Skin is warm. No rashes, no other new lesions, LE edema - chronic trace -1+ bilateral left > right               Psychiatric: Pt behavior is normal without agitation   Micro: none  Cardiac tracings I have personally interpreted today:  none  Pertinent Radiological findings (summarize): none   Lab Results  Component Value Date   WBC 6.8 05/24/2021   HGB 8.5 (L) 05/24/2021   HCT 27.8 (L) 05/24/2021   PLT 259.0 05/24/2021   GLUCOSE 102 (H) 04/20/2021   CHOL 92 02/16/2021   TRIG 88.0 02/16/2021   HDL 34.60 (L) 02/16/2021   LDLCALC 40 02/16/2021   ALT 12 02/16/2021   AST 18 02/16/2021   NA 140 04/20/2021   K 4.5 04/20/2021   CL 106 04/20/2021   CREATININE 1.38 (H) 04/20/2021   BUN 18 04/20/2021   CO2 28 04/20/2021   TSH 5.37 05/24/2021   INR 1.7 (H) 11/01/2020   HGBA1C 7.5 (H) 02/16/2021   MICROALBUR <0.7 02/16/2021   Assessment/Plan:  Mary Mccarthy is a 81 y.o. White or Caucasian [1] female with  has a past medical history of ALLERGIC RHINITIS (06/24/2007), Anemia, AVM (arteriovenous malformation) of colon, Blood transfusion without reported diagnosis (05/2015), COLONIC POLYPS, HX OF (06/24/2007), Coronary artery disease, non-occlusive (05/2015), DEPRESSION (10/08/2006), DIABETES MELLITUS, TYPE II (10/05/2006), GERD (06/24/2007), History of CVA (cerebrovascular accident) (08/10/2020), HYPERLIPIDEMIA  (10/08/2006), HYPERTENSION (10/08/2006), INSOMNIA (09/24/2008), Left knee DJD, Nonsustained paroxysmal ventricular tachycardia ([place]) (10/2018), Osteoporosis (03/10/2016), PAF (paroxysmal atrial fibrillation) ([place]), PAF with RVR: CHA2DS2-VASc 9.  On Eliquis and amiodarone. (08/11/2020), PEPTIC ULCER DISEASE (10/08/2006), S/P TAVR (transcatheter aortic valve replacement) (10/26/2020), and Severe aortic stenosis (08/2020).  No problem-specific Assessment & Plan notes found for this encounter.  Followup: No follow-ups on file.  Cathlean Cower, MD 09/23/2021 1:22 PM [facility]

## 2021-09-23 NOTE — Patient Instructions (Signed)
Please continue all other medications as before, and refills have been done if requested.  Please have the pharmacy call with any other refills you may need.  Please continue your efforts at being more active, low cholesterol diet, and weight control.  Please keep your appointments with your specialists as you may have planned  Please go to the LAB at the blood drawing area for the tests to be done  You will be contacted by phone if any changes need to be made immediately.  Otherwise, you will receive a letter about your results with an explanation, but please check with MyChart first.  Please remember to sign up for MyChart if you have not done so, as this will be important to you in the future with finding out test results, communicating by private email, and scheduling acute appointments online when needed.  Please make an Appointment to return in 6 months, or sooner if needed, also with Lab Appointment for testing done 3-5 days before at the FIRST FLOOR Lab (so this is for TWO appointments - please see the scheduling desk as you leave)  

## 2021-09-24 ENCOUNTER — Encounter: Payer: Self-pay | Admitting: Internal Medicine

## 2021-09-24 NOTE — Assessment & Plan Note (Signed)
BP Readings from Last 3 Encounters:  09/23/21 138/72  07/18/21 (!) 147/72  07/11/21 (!) 162/72   Stable, pt to continue medical treatment norvasc 10 mg qd

## 2021-09-24 NOTE — Assessment & Plan Note (Signed)
Lab Results  Component Value Date   TSH 5.47 09/23/2021   Stable, pt to continue levothyroxine 25 mcg qd

## 2021-09-24 NOTE — Assessment & Plan Note (Signed)
Lab Results  Component Value Date   LDLCALC 45 09/23/2021   Stable, pt to continue current statin lipitor 40 mg qd

## 2021-09-24 NOTE — Assessment & Plan Note (Signed)
Last vitamin D Lab Results  Component Value Date   VD25OH 15.49 (L) 09/23/2021   Low, to start oral replacement

## 2021-09-24 NOTE — Assessment & Plan Note (Signed)
Lab Results  Component Value Date   VITAMINB12 1,063 (H) 09/23/2021   Stable, cont oral replacement - b12 1000 mcg qd

## 2021-10-02 ENCOUNTER — Other Ambulatory Visit: Payer: Self-pay | Admitting: Internal Medicine

## 2021-11-04 ENCOUNTER — Other Ambulatory Visit: Payer: Self-pay | Admitting: Internal Medicine

## 2021-11-08 ENCOUNTER — Other Ambulatory Visit (HOSPITAL_COMMUNITY): Payer: Medicare Other

## 2021-11-08 NOTE — Progress Notes (Signed)
HEART AND [place]                                     Cardiology Office Note:    Date:  11/09/2021   ID:  [name], DOB 1940/06/02, MRN [id number]  PCP:  Biagio Borg, MD  [facility] Cardiologist:  Glenetta Hew, MD / Dr. Angelena Form & Dr. Cyndia Bent (TAVR) [facility] Electrophysiologist:  None   Referring MD: Biagio Borg, MD   1 year s/p TAVR  History of Present Illness:    Mary Mccarthy is a 81 y.o. female with a hx of obesity, DMT2, non obstructive CAD, PUD/AVM with recurrent GI bleeding, iron deficiency anemia, PAF on amio and Eliquis (initially diagnosed at the time of acute CVA in 08/2020), HTN, pulmonary HTN, chronic diastolic CHF, and severe AS s/p TAVR (10/27/20) who presents to clinic to for follow up.  She was admitted in 2017 with urosepsis, AKI and acute anemia with a hemoglobin of 5 requiring transfusion.  She underwent EGD and colonoscopy which showed several areas of potential GI bleeding but no current bleeding.  She was found to have AVMs and peptic ulcer disease.   She was admitted in July 2022 with a stroke and diagnosed with new atrial fibrillation at that time.  MRI of the brain showed small acute infarct diffusion bilaterally. She was started on Eliquis.  Echo at that time showed EF 55 to 60%, moderately elevated pulmonary hypertension, severe concentric LVH, severe mitral annular calcification and severe aortic stenosis with a mean gradient of 42 mmHg, peak gradient of 76 mmHg, AVA of 0.73 cm, DVI 0.27.    She was then readmitted in August 2022 with acute on chronic diastolic CHF in the setting of severe aortic stenosis.  She was diuresed with IV Lasix.  She also had atrial fibrillation with RVR that converted to sinus with IV amiodarone.  Cardiology was consulted and she was seen by Dr. Burt Knack with the structural heart team.  She underwent left and right heart cath which showed nonobstructive CAD with severe  aortic stenosis with a mean gradient of 53 mmHg and pre TAVR CTs with plans for outpatient follow up. However, she was readmitted 9/12-9/22/22 for symptomatic anemia 2/2 GI bleed. Hg 7.4 on admission. Transfused with 1 unit PRBCs. FOBT +. EDG 9/15 showed gastritis with no signs of active bleeding but stigmata of recent bleeding.  It was suspected that the patient's anemia and melena was due to bleeding from gastric erosions in the setting of Eliquis use. Colonoscopy was deferred given severe AS and risk with sedation. She underwent successful TAVR with a 26 mm Medtronic Evolut Pro + THV via the TF approach on 10/27/20. She developed CHB intraoperatively that ultimately resolved. Post operative echo showed EF 60%, normally functioning TAVR with a mean gradient of 12 mmHg and trivial PVL. She was discharged on Eliquis without concurrent antiplatelet therapy given recent GI bleeding. She was also discharged with a Zio AT. Coreg held at DC. Monitor did not show any block or bradyarrhythmia.   She was then readmitted 9/26-9/29/22 for n/v/diarrhea and AKI and lactic acidosis which improved with fluids. Hg stable 8-9. Repeat Echo 11/02/20 with EF 60-65%, no RMWA, no AI or AS, 26 mm Sapien prosthetic (TAVR) valve present in the aortic position, normal structure and function of the aortic valve prosthesis. Tele showed intermittent A  fib RVR with ventricular rate up to 130s and NSVT lasting up to 9 runs. She was started on metoprolol XL 12.58m.   Later her HG was noted to drop to 7.6 and her Eliquis was held. She was referred to Dr. CBurt Knackfor consideration of Watchman. It was decided to start her back on Eliquis 2.5 mg BID and start the work up for [employer] She was ultimately not felt to be a Watchman candidate and anticoagulation was discontinued.   Today the patient presents to clinic for follow up. Feels the best she has in months. No CP or SOB. No LE edema, orthopnea or PND. No dizziness or syncope. No blood in  stool or urine. No palpitations.  No longer having headaches. Has also lost some intentional weight .  Past Medical History:  Diagnosis Date   ALLERGIC RHINITIS 06/24/2007   Anemia    AVM (arteriovenous malformation) of colon    Blood transfusion without reported diagnosis 05/2015   COLONIC POLYPS, HX OF 06/24/2007   Coronary artery disease, non-occlusive 05/2015   Trop + w/ Acute Anemia =>CATH: small RI - Ostial 60%, ostial RCA 30% and dLAD 40-50%;; 9/'21: Cor Ca Score 624. Mild (25-49%) prox RCA & LAD,; Moderate (50-69%) Ostial Small RI & prox LCx.     DEPRESSION 10/08/2006   DIABETES MELLITUS, TYPE II 10/05/2006   GERD 06/24/2007   History of CVA (cerebrovascular accident) 08/10/2020   08/2020- found to be in afib with RVR, started on Eliquis (shower of emboli from A. fib as complication of severe AS)   HYPERLIPIDEMIA 10/08/2006   HYPERTENSION 10/08/2006   INSOMNIA 09/24/2008   Left knee DJD    [place]onsustained paroxysmal [place] tachycardia [place]   Post-TAVR Zio Patch: Frequent (206) runs of NSVT - Fastest 5 beats 239 bpm. Longest 9.3 Sec avg 135 bpm. (? possibly Afib w/ aberrancy)   Osteoporosis 03/10/2016   PAF (paroxysmal atrial fibrillation) (HCC)    PAF with RVR: CHA2DS2-VASc 9.  On Eliquis and amiodarone. 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 MAngelena Form& Dr. BCyndia Bent  Severe aortic stenosis 08/2020   Progression from Mod-Severe AS to Severe AS - noted on Echo 08/2020 -> (mean gradient progressed from 34 to 42 mmHg) this was in setting of new onset A. fib RVR, acute diastolic HF and shower of emboli CVA. ->  Referred for TAVR, completed 10/26/2020    Past Surgical History:  Procedure Laterality Date   APPENDECTOMY     BIOPSY  10/21/2020   Procedure: BIOPSY;  Surgeon: DSharyn Creamer MD;  [facility];  Service:  Gastroenterology;;   BREAST BIOPSY     CARDIAC CATHETERIZATION N/A 05/21/2015   Procedure: Left Heart Cath and Coronary Angiography;  Surgeon: DLeonie Man MD;  [facility];  Service: Cardiovascular;: Ost RI 60%, Ost RCA 30%, dLAD tapers to small vessel w/ 40-50%. Mildly elevated LVEDP. Normal LV Fxn.   COLONOSCOPY N/A 05/20/2015   Procedure: COLONOSCOPY;  Surgeon: SManus Gunning MD;  [facility] [facility]  Service: Gastroenterology;  Laterality: N/A;   CORONARY CA2+ SCORE / CARDIAC CT ANGIOGRAM  10/09/2019   Calcium score 624.  82nd percentile. Dominant RCA: Mild (25-49%) proximal stenosis-distal bifurcation into PDA and PAV--< RPL branches.  LAD (1 major mid vessel diagonal) diffuse calcified plaque, mild proximal stenosis with minimal  distal stenosis.  Small RI moderate ostial disease.  LCx-moderate mixed (50-69%) proximal stenosis.  Small dOM1 disease.  Trileaflet AoV, annular Ca2+ - probable AS   ESOPHAGOGASTRODUODENOSCOPY N/A 05/20/2015   Procedure: ESOPHAGOGASTRODUODENOSCOPY (EGD);  Surgeon: Manus Gunning, MD;  Location: [place];  Service: Gastroenterology;  Laterality: N/A;   ESOPHAGOGASTRODUODENOSCOPY (EGD) WITH PROPOFOL N/A 10/21/2020   Procedure: ESOPHAGOGASTRODUODENOSCOPY (EGD) WITH PROPOFOL;  Surgeon: Sharyn Creamer, MD;  Location: [place];  Service: Gastroenterology;  Laterality: N/A;   INTRAOPERATIVE TRANSTHORACIC ECHOCARDIOGRAM N/A 10/26/2020   Procedure: INTRAOPERATIVE TRANSTHORACIC ECHOCARDIOGRAM;  Surgeon: Angelena Form; [facility]; Pre-TAVR well-visualized calcified AoV mean Grad 37 mmHg, AVA 0.72 cm => Post TAVR well-positioned supra-annular 26 mm Medtronic Evolut Pro valve placed with no PVL.  Mean gradient 14 mmHg.  AVR 1.58 cm.  Normal flow to RCA and LM-LAD.  ; EF 60 to 65%.  Degenerative severe MAC w/ mild MR.   RIGHT/LEFT HEART CATH AND CORONARY ANGIOGRAPHY N/A 09/29/2020   Procedure: RIGHT/LEFT HEART CATH AND CORONARY  ANGIOGRAPHY;  Surgeon: Burnell Blanks, MD;  Location: [place] [facility];  Service: Cardiovascular; pre-TAVR:  pRCA 20%, m-dRCA 30%. D LM-pLAD 20%. RI 30%, mLAD 20%.  Severe AS with mean gradient measured at 52.8 mmHg.   TONSILLECTOMY     TRANSCATHETER AORTIC VALVE REPLACEMENT, TRANSFEMORAL N/A 10/26/2020   Procedure: TRANSCATHETER AORTIC VALVE REPLACEMENT, TRANSFEMORAL;  Surgeon: Burnell Blanks, MD;  Location: [facility];  Service: Open Heart Surgery;; Medtronic Evolut-Pro + (size 26 mm, model # EVPROPLUS -26US, serial # [id number]); transient high-grade AV block noted so PPM left in place.   TRANSTHORACIC ECHOCARDIOGRAM  03/17/2019   a) 03/2019: EF 60 to 65%.  Moderate LVH.  GRII DD.  Mod-Severe AS (m grad 36 mmHg, peak 59 mmHg); b) 09/2019: EF 65-70%, No RWMA. Gr1 DD/hi LAP, Mild hi PAP. Mod LA Dil. MOD AS (mean Grad 34.5 mmHg).  STABLE   TRANSTHORACIC ECHOCARDIOGRAM  08/27/2020   Admitted for CVA/Afib RVR & CHF (Progression to SEVERE SYMPTOMATIC AS):  Severe Calcific Aortic Stenosis (VTI AVA estimated 0.86 cm, mean gradient 42 mmHg, V-max 4.36 m/s).  EF 55 to 60%.  Severe concentric LVH.  Unable to determine diastolic parameters.  Moderately elevated PAP.  Mild LA dilation.  Mild circumferential pericardial effusion.  Trivial MR.  Severe MAC.   TRANSTHORACIC ECHOCARDIOGRAM  11/25/2020   1 Month s/p TAVR: EF 55 to 60%.  No R WMA.  GR 1 DD.  Elevated LVEDP.  Normal RV size with mildly elevated RVP estimated 44 mmHg.  Oscillating density in the RV suspect calcified chordal apparatus versus calcified thrombus.  Moderate LA dilation.  Normal IVC/RA P => Well-positioned 26 mm Medtronic Evolut Pro THVwith mean AOV gradient 11 mmHg.  Trivial PVL   TRANSTHORACIC ECHOCARDIOGRAM  11/02/2020   a) Day 1 Post-op TAVR 10/27/20:  Well-Positioned Supra Annular Medtronic Evolut Pro THP.  Mean gradient 12 mmHg, peak 24 mmHg.  AVA 1.8 cm.  Trivial PVL.  EF 60 to 65%.  Normal LV function.  Mild LVH.  Severe LA  dilation.  Mild RA dilation.  Severe MAC;; b) 11/02/20: Normal structure and function of the aortic valve prosthesis.  Mean gradient 9 mmHg (otherwise stable)   TUBAL LIGATION      Current Medications: Current Meds  Medication Sig   acetaminophen (TYLENOL) 500 MG tablet Take 500 mg by mouth every 6 (six) hours as needed for moderate pain.   ALPRAZolam (XANAX) 0.25 MG tablet TAKE 1 TABLET BY  MOUTH THREE TIMES A DAY AS NEEDED FOR ANXIETY   amiodarone (PACERONE) 200 MG tablet Take 1 tablet (200 mg total) by mouth daily.   amLODipine (NORVASC) 10 MG tablet Take 1 tablet (10 mg total) by mouth daily.   atorvastatin (LIPITOR) 40 MG tablet TAKE 1 TABLET BY MOUTH EVERY DAY   blood glucose meter kit and supplies Dispense based on patient and insurance preference. Use up to four times daily as directed. E11.9   Blood Glucose Monitoring Suppl (ONE TOUCH ULTRA 2) w/Device KIT Use as directed daily   citalopram (CELEXA) 10 MG tablet Take 1 tablet (10 mg total) by mouth daily.   furosemide (LASIX) 40 MG tablet TAKE 1 TABLET BY MOUTH EVERY DAY   iron polysaccharides (NU-IRON) 150 MG capsule Take 1 capsule (150 mg total) by mouth 2 (two) times daily.   Lancets (ONETOUCH DELICA PLUS [id number]) MISC USE AS DIRECTED TEST 1-2 TIMES A DAY   levothyroxine (SYNTHROID) 25 MCG tablet Take 1 tablet (25 mcg total) by mouth daily.   losartan (COZAAR) 100 MG tablet Take 1 tablet (100 mg total) by mouth daily.   metFORMIN (GLUCOPHAGE) 1000 MG tablet TAKE 1 TAB BY MOUTH IN THE AM,AND 1/2 TAB IN THE PM (Patient taking differently: Take 500 mg by mouth 2 (two) times daily with a meal. TAKE 1 TAB BY MOUTH IN THE AM,AND 1/2 TAB IN THE PM)   metoprolol succinate (TOPROL-XL) 25 MG 24 hr tablet Take 0.5 tablets (12.5 mg total) by mouth daily.   mirtazapine (REMERON) 15 MG tablet TAKE 1 TABLET BY MOUTH EVERYDAY AT BEDTIME   ONETOUCH ULTRA test strip USE AS INSTRUCTED TEST 1-2 TIMES A DAY   pantoprazole (PROTONIX) 40 MG tablet TAKE  1 TABLET BY MOUTH TWICE A DAY   potassium chloride SA (KLOR-CON M) 20 MEQ tablet Take 1 tablet (20 mEq total) by mouth daily.     Allergies:   Ibuprofen   Social History   Socioeconomic History   Marital status: Widowed    Spouse name: Not on file   Number of children: 1   Years of education: Not on file   Highest education level: Not on file  Occupational History   Occupation: retired - Social research officer, government: RETIRED  Tobacco Use   Smoking status: Never   Smokeless tobacco: Never  Vaping Use   Vaping Use: Never used  Substance and Sexual Activity   Alcohol use: No    Alcohol/week: 0.0 standard drinks of alcohol   Drug use: No   Sexual activity: Not on file  Other Topics Concern   Not on file  Social History Narrative   Recently widowed--husband died in 11-28-2018.      Now lives alone but her son usually stays with her at night.  During the day she has 2 nephews who live nearby to come in and check on her intermittently.  Also one of her nieces calls routinely.      When her son is home and awake, she may try to walk on a treadmill, but is scared to walk outside.   Social Determinants of Health   Financial Resource Strain: Medium Risk (05/09/2021)   Overall Financial Resource Strain (CARDIA)    Difficulty of Paying Living Expenses: Somewhat hard  Food Insecurity: No Food Insecurity (08/03/2021)   Hunger Vital Sign    Worried About Running Out of Food in the Last Year: Never true    Ran Out of Food  in the Last Year: Never true  Transportation Needs: No Transportation Needs (08/03/2021)   PRAPARE - Hydrologist (Medical): No    Lack of Transportation (Non-Medical): No  Physical Activity: Inactive (05/09/2021)   Exercise Vital Sign    Days of Exercise per Week: 0 days    Minutes of Exercise per Session: 0 min  Stress: Stress Concern Present (05/09/2021)   [place]     Feeling of Stress : Rather much  Social Connections: Socially Isolated (05/09/2021)   Social Connection and Isolation Panel [NHANES]    Frequency of Communication with Friends and Family: More than three times a week    Frequency of Social Gatherings with Friends and Family: Twice a week    Attends Religious Services: Never    Marine scientist or Organizations: No    Attends Archivist Meetings: Never    Marital Status: Widowed     Family History: The patient's family history includes Alcohol abuse in an other family member; Arrhythmia in her sister; Arthritis in an other family member; CAD in her sister; Cancer in her mother; Depression in an other family member; Diabetes in an other family member; Heart attack (age of onset: 52) in her sister; Heart disease in an other family member; Hyperlipidemia in an other family member; Hypertension in an other family member; Stroke in an other family member.  ROS:   Please see the history of present illness.    All other systems reviewed and are negative.  EKGs/Labs/Other Studies Reviewed:    The following studies were reviewed today:   TAVR OPERATIVE NOTE   Date of Procedure:                 10/26/2020   Preoperative Diagnosis:      Severe Aortic Stenosis    Postoperative Diagnosis:    Same    Procedure:        Transcatheter Aortic Valve Replacement - Percutaneous Right Transfemoral Approach             Medtronic Evolut-Pro + (size 26 mm, model # EVPROPLUS -26US, serial # [id number])              Co-Surgeons:            Gaye Pollack, MD and Lauree Chandler, MD     Anesthesiologist:                  Gennie Alma, MD   Echocardiographer:              Edmonia James, MD   Pre-operative Echo Findings: Severe aortic stenosis and moderate AI  Normal left ventricular systolic function   Post-operative Echo Findings: No paravalvular leak Normal left ventricular systolic function      _____________    Echo  11/02/20:  1. Left ventricular ejection fraction, by estimation, is 60 to 65%. The  left ventricle has normal function. The left ventricle has no regional  wall motion abnormalities. There is mild left ventricular hypertrophy.  Left ventricular diastolic parameters are indeterminate.   2. Right ventricular systolic function is normal. The right ventricular  size is normal.   3. Left atrial size was moderately dilated.   4. The mitral valve is normal in structure. No evidence of mitral valve  regurgitation. No evidence of mitral stenosis.   5. The aortic valve has been repaired/replaced. Aortic valve  regurgitation is not visualized.  No aortic stenosis is present. There is a  26 mm Sapien prosthetic (TAVR) valve present in the aortic position.  Procedure Date: 10/26/2020. Echo findings are  consistent with normal structure and function of the aortic valve  prosthesis. Aortic valve area, by VTI measures 1.37 cm. Aortic valve mean gradient measures 9.0 mmHg. Aortic valve Vmax measures 2.16 m/s.   6. The inferior vena cava is normal in size with greater than 50%  respiratory variability, suggesting right atrial pressure of 3 mmHg.   _____________     Echo 10/27/20:  1. Left ventricular ejection fraction, by estimation, is 60 to 65%. The  left ventricle has normal function. The left ventricle has no regional  wall motion abnormalities. There is mild left ventricular hypertrophy.  Left ventricular diastolic parameters  are indeterminate.   2. Right ventricular systolic function is normal. The right ventricular  size is normal.   3. Left atrial size was severely dilated.   4. Right atrial size was mildly dilated.   5. The mitral valve is degenerative. Mild mitral valve regurgitation.  Severe mitral annular calcification.   6. Post TAVR with 26 mm supra annular Medtronic Evolut Pro valve trivial  PvL mean gradient 12 peak 24 mmHg AVA 1.8 cm2 with DVI 0.63. The aortic  valve has been  repaired/replaced. Aortic valve regurgitation is trivial.  Procedure Date: 10/26/2020.     _____________     Elwyn Reach AT 9/22-10/5/22 Patch Wear Time:  12 days and 19 hours (2022-09-22T09:52:09-398 to 2022-10-05T05:32:17-0400)   Patient had a min HR of 49 bpm, max HR of 231 bpm, and avg HR of 74 bpm. Predominant underlying rhythm was Sinus Rhythm. 206 Ventricular Tachycardia runs occurred, the run with the fastest interval lasting 5 beats with a max rate of 231 bpm, the longest  lasting 9.3 secs with an avg rate of 175 bpm. Episode(s) of Ventricular Tachycardia may be possible Atrial Fibrillation with aberrancy. Atrial Fibrillation occurred (16% burden), ranging from 62-179 bpm (avg of 105 bpm), the longest lasting 17 hours 55  mins with an avg rate of 100 bpm. Some episodes of Atrial Fibrillation conducted with possible aberrancy. Isolated SVEs were rare (<1.0%), SVE Couplets were rare (<1.0%), and SVE Triplets were rare (<1.0%). Isolated VEs were rare (<1.0%, 6271), VE  Couplets were rare (<1.0%, 2545), and VE Triplets were rare (<1.0%, 66). Difficulty discerning atrial activity making definitive diagnosis difficult to ascertain. Previously notified: MD notification criteria for First Documentation of Atrial  Fibrillation met - attempt was made to call account numerous times on 10/28/2020 at 8:18, 8:56 and 9:41 PM, left messages, no answer (BM).  _____________    Echo 11/25/20 IMPRESSIONS   1. Left ventricular ejection fraction, by estimation, is 55 to 60%. The  left ventricle has normal function. The left ventricle has no regional  wall motion abnormalities. There is mild left ventricular hypertrophy.  Left ventricular diastolic parameters  are consistent with Grade I diastolic dysfunction (impaired relaxation).  Elevated left ventricular end-diastolic pressure. The E/e' is 20.   2. Oscillating density seen in the RV, inferior to the TV (images 45-46),  suspect calcified chordal apparatus  or less likely calcified  thrombus/vegetaton -clinical correlation advised. Right ventricular  systolic function is normal. The right ventricular   size is normal. There is mildly elevated pulmonary artery systolic  pressure. The estimated right ventricular systolic pressure is 54.2 mmHg.   3. Left atrial size was moderately dilated.   4. The mitral valve is abnormal. Trivial mitral  valve regurgitation. No  evidence of mitral stenosis. Moderate mitral annular calcification.   5. The aortic valve has been repaired/replaced. Aortic valve  regurgitation is trivial. There is a 26 mm Medtronic Evolut valve present  in the aortic position. Procedure Date: 10/26/2020. Echo findings are  consistent with perivalvular leak of the aortic  prosthesis. Aortic valve area, by VTI measures 2.05 cm. Aortic valve mean  gradient measures 11.0 mmHg. Aortic valve Vmax measures 2.44 m/s.   6. The inferior vena cava is normal in size with greater than 50%  respiratory variability, suggesting right atrial pressure of 3 mmHg.   Comparison(s): Changes from prior study are noted. 11/02/2020: LVEF 60-65%, TAVR with mean gradient of 9 mmHg, no perivalvular leak.   ____________________  Echo 11/09/21 IMPRESSIONS     1. Left ventricular ejection fraction, by estimation, is 60 to 65%. The  left ventricle has normal function. The left ventricle has no regional  wall motion abnormalities. Left ventricular diastolic parameters are  consistent with Grade II diastolic  dysfunction (pseudonormalization).   2. Right ventricular systolic function is normal. The right ventricular  size is normal. There is mildly elevated pulmonary artery systolic  pressure.   3. Left atrial size was moderately dilated.   4. The mitral valve is normal in structure. Trivial mitral valve  regurgitation. No evidence of mitral stenosis. Severe mitral annular  calcification.   5. The aortic valve has been repaired/replaced. Aortic valve   regurgitation is trivial. There is a 26 mm CoreValve-Evolut Pro prosthetic  (TAVR) valve present in the aortic position. Procedure Date: 10/26/2020.  Echo findings are consistent with  perivalvular leak of the aortic prosthesis. Aortic valve mean gradient  measures 9.2 mmHg.   6. The inferior vena cava is normal in size with greater than 50%  respiratory variability, suggesting right atrial pressure of 3 mmHg.   Comparison(s): No significant change from prior study. TAVR unchanged.  Prior echo noted mobile echodensity in RV--RV not well visualized on  current study.    EKG:  EKG is NOT ordered today.    Recent Labs: 02/02/2021: Magnesium 1.5; NT-Pro BNP 623 05/24/2021: Hemoglobin 8.5; Platelets 259.0 09/23/2021: ALT 22; BUN 42; Creatinine, Ser 2.09; Potassium 4.8; Sodium 141; TSH 5.47  Recent Lipid Panel    Component Value Date/Time   CHOL 98 09/23/2021 1342   TRIG 87.0 09/23/2021 1342   TRIG 90 12/01/2005 1330   HDL 35.90 (L) 09/23/2021 1342   CHOLHDL 3 09/23/2021 1342   VLDL 17.4 09/23/2021 1342   LDLCALC 45 09/23/2021 1342     Risk Assessment/Calculations:    CHA2DS2-VASc Score =     This indicates a  % annual risk of stroke. The patient's score is based upon:         Physical Exam:    VS:  BP 124/64   Pulse (!) 56   Ht _0  (1.549 m)   Wt 185 lb (83.9 kg)   SpO2 98%   BMI 34.96 kg/m     Wt Readings from Last 3 Encounters:  11/09/21 185 lb (83.9 kg)  09/23/21 184 lb (83.5 kg)  07/18/21 187 lb 3.2 oz (84.9 kg)     GEN:  Well nourished, well developed in no acute distress, obese HEENT: Normal NECK: No JVD LYMPHATICS: No lymphadenopathy CARDIAC: RRR, 3/6 flow murmur. No rubs, gallops RESPIRATORY:  Clear to auscultation without rales, wheezing or rhonchi  ABDOMEN: Soft, non-tender, non-distended MUSCULOSKELETAL:  No edema; No deformity  SKIN: Warm and  dry.   NEUROLOGIC:  Alert and oriented x 3 PSYCHIATRIC:  Normal affect   ASSESSMENT:    1. S/P  TAVR (transcatheter aortic valve replacement)   2. Iron deficiency anemia due to chronic blood loss   3. PAF (paroxysmal atrial fibrillation) ([place])   4. Essential hypertension   5. Chronic diastolic CHF (congestive heart failure) (HCC)     PLAN:    In order of problems listed above:   Severe AS s/p TAVR: echo today shows EF 60%, normally functioning TAVR with a mean gradient of 9 mm hg and trivial PVL as well as severe MAC. She has NYHA class I symptoms. SBE prophylaxis discussed; the patient is edentulous and does not go to the dentist. No longer on aspirin or Eliquis due to history of extensive GI bleeding with anemia. She is doing the best she has in months, so I will leave as is.    Chronic anemia 2/2 GI bleeding: this has been stable off blood thinning medications.   PAF: sounds regular on exam today. Continue amio 279m daily and Toprol Xl 12.524mdaily. No OAC given issues with extensive GI bleeding with anemia. Not felt to be a LAAO candidate.    HTN: BP well controlled. No changes made   Chronic diastolic CHF: appears euvolemic. Continue Lasix [id number].    Medication Adjustments/Labs and Tests Ordered: Current medicines are reviewed at length with the patient today.  Concerns regarding medicines are outlined above.  No orders of the defined types were placed in this encounter.    No orders of the defined types were placed in this encounter.    Patient Instructions  Medication Instructions:  Your physician recommends that you continue on your current medications as directed. Please refer to the Current Medication list given to you today.  *If you need a refill on your cardiac medications before your next appointment, please call your pharmacy*   Lab Work: None ordered   If you have labs (blood work) drawn today and your tests are completely normal, you will receive your results only by: [place] have [place]) OR A paper copy in the  mail If you have any lab test that is abnormal or we need to change your treatment, we will call you to review the results.   Testing/Procedures: None ordered    Follow-Up: Follow up as scheduled   Other Instructions   Important Information About Sugar         Signed, KathAngelena Form-C  11/09/2021 3:11 PM    [facility]

## 2021-11-09 ENCOUNTER — Ambulatory Visit: Payer: Medicare Other | Attending: Cardiology

## 2021-11-09 ENCOUNTER — Ambulatory Visit: Payer: Medicare Other | Admitting: Physician Assistant

## 2021-11-09 VITALS — BP 124/64 | HR 56 | Ht 61.0 in | Wt 185.0 lb

## 2021-11-09 DIAGNOSIS — Z952 Presence of prosthetic heart valve: Secondary | ICD-10-CM | POA: Insufficient documentation

## 2021-11-09 DIAGNOSIS — I2510 Atherosclerotic heart disease of native coronary artery without angina pectoris: Secondary | ICD-10-CM | POA: Insufficient documentation

## 2021-11-09 DIAGNOSIS — I480 Paroxysmal atrial fibrillation: Secondary | ICD-10-CM | POA: Diagnosis not present

## 2021-11-09 DIAGNOSIS — I5032 Chronic diastolic (congestive) heart failure: Secondary | ICD-10-CM | POA: Diagnosis not present

## 2021-11-09 DIAGNOSIS — I10 Essential (primary) hypertension: Secondary | ICD-10-CM

## 2021-11-09 DIAGNOSIS — D500 Iron deficiency anemia secondary to blood loss (chronic): Secondary | ICD-10-CM

## 2021-11-09 NOTE — Patient Instructions (Signed)

## 2021-11-10 LAB — ECHOCARDIOGRAM COMPLETE
AV Mean grad: 9.2 mmHg
AV Peak grad: 16.6 mmHg
Ao pk vel: 2.04 m/s
Area-P 1/2: 3.37 cm2
P 1/2 time: 657 msec
S' Lateral: 2.9 cm

## 2021-11-30 ENCOUNTER — Other Ambulatory Visit: Payer: Self-pay | Admitting: Physician Assistant

## 2022-01-02 ENCOUNTER — Ambulatory Visit: Payer: Medicare Other | Attending: Cardiology | Admitting: Cardiology

## 2022-01-02 ENCOUNTER — Encounter: Payer: Self-pay | Admitting: Cardiology

## 2022-01-02 VITALS — BP 170/62 | HR 47 | Ht 61.0 in | Wt 191.6 lb

## 2022-01-02 DIAGNOSIS — I2510 Atherosclerotic heart disease of native coronary artery without angina pectoris: Secondary | ICD-10-CM | POA: Diagnosis not present

## 2022-01-02 DIAGNOSIS — D6869 Other thrombophilia: Secondary | ICD-10-CM | POA: Diagnosis not present

## 2022-01-02 DIAGNOSIS — E1165 Type 2 diabetes mellitus with hyperglycemia: Secondary | ICD-10-CM

## 2022-01-02 DIAGNOSIS — I5032 Chronic diastolic (congestive) heart failure: Secondary | ICD-10-CM

## 2022-01-02 DIAGNOSIS — E1169 Type 2 diabetes mellitus with other specified complication: Secondary | ICD-10-CM

## 2022-01-02 DIAGNOSIS — I10 Essential (primary) hypertension: Secondary | ICD-10-CM | POA: Diagnosis not present

## 2022-01-02 DIAGNOSIS — Z952 Presence of prosthetic heart valve: Secondary | ICD-10-CM | POA: Diagnosis not present

## 2022-01-02 DIAGNOSIS — I480 Paroxysmal atrial fibrillation: Secondary | ICD-10-CM

## 2022-01-02 DIAGNOSIS — N1832 Chronic kidney disease, stage 3b: Secondary | ICD-10-CM

## 2022-01-02 DIAGNOSIS — E785 Hyperlipidemia, unspecified: Secondary | ICD-10-CM

## 2022-01-02 DIAGNOSIS — Z79899 Other long term (current) drug therapy: Secondary | ICD-10-CM

## 2022-01-02 NOTE — Patient Instructions (Signed)
Medication Instructions:   No changes   *If you need a refill on your cardiac medications before your next appointment, please call your pharmacy*   Lab Work:today  BMP CBC   If you have labs (blood work) drawn today and your tests are completely normal, you will receive your results only by: MyChart Message (if you have MyChart) OR A paper copy in the mail If you have any lab test that is abnormal or we need to change your treatment, we will call you to review the results.   Testing/Procedures:    Follow-Up: At [facility], you and your health needs are our priority.  As part of our continuing mission to provide you with exceptional heart care, we have created designated Provider Care Teams.  These Care Teams include your primary Cardiologist (physician) and Advanced Practice Providers (APPs -  Physician Assistants and Nurse Practitioners) who all work together to provide you with the care you need, when you need it.  We recommend signing up for the patient portal called "MyChart".  Sign up information is provided on this After Visit Summary.  MyChart is used to connect with patients for Virtual Visits (Telemedicine).  Patients are able to view lab/test results, encounter notes, upcoming appointments, etc.  Non-urgent messages can be sent to your provider as well.   To learn more about what you can do with MyChart, go to [email or web address].    Your next appointment:   6 month(s)  The format for your next appointment:   In Person  Provider:   Bryan Lemma, MD

## 2022-01-02 NOTE — Progress Notes (Signed)
Primary Care Provider: Biagio Borg, MD [place] Cardiologist: Glenetta Hew, MD Electrophysiologist: None - Dr. Sallyanne Kuster TAVR/Structural Heart Team: Dr. Angelena Form, (Dr. Burt Knack) Dr. Cyndia Bent;    Clinic Note: Chief Complaint  Patient presents with   Follow-up    79-month  Cardiac Valve Problem    Status post TAVR.  Doing well.  Slow recovery colitis probably better.   Atrial Fibrillation    Maintaining sinus rhythm on amiodarone.  No longer on DOAC.    ===================================  ASSESSMENT/PLAN   Problem List Items Addressed This Visit       Cardiology Problems   Hyperlipidemia associated with type 2 diabetes mellitus (HCC) (Chronic)   Essential hypertension (Chronic)    Blood pressure is really high today.  She says usually at home is 130s over 60s.  I think a lot of this is whitecoat related.  Since she is feeling well, I am reluctant to really push things too much.  She is already on high-dose of atorvastatin and losartan.  I will be very leery of doing a thiazide diuretic in setting of pending dose of furosemide. On low-dose Toprol which we could potentially increase or convert to carvedilol depending on what her heart rate does with reducing amiodarone.      PAF with RVR: CHA2DS2-VASc 9.  On Eliquis and amiodarone. (Chronic)    Thankfully, no breakthrough spells of A-fib on amiodarone.  She still has bradycardia and some fatigue.  CHA2DS2-VASc score was almost 9 and has bled score is 3-4.  Plan was to avoid any anticoagulation going forward.  Plan: Continue rhythm control with amiodarone, but reduce dose to 100 mg daily No aspirin or DOAC as noted Continue low-dose beta-blocker      Relevant Orders   EKG 12-Lead (Completed)   Coronary artery disease, non-occlusive (Chronic)   CHF (congestive heart failure), NYHA class II, chronic, diastolic (HCC) - Primary (Chronic)    Chronic HFpEF following TAVR.  Not unexpectedly there is diastolic  function.  She is on standard furosemide and maintaining relatively stable volume level.  Is on low-dose beta-blocker and high-dose ARB (could directly convert to a more potent ARB) along with amlodipine 10 mg daily..  At the risk of not wanting to eat increase her polypharmacy I am can hold off on adding additional medication.  Can follow-up closely.  Needs BMP and CMP checked today.      Relevant Orders   EKG 12-Lead (Completed)     Other   Type 2 diabetes mellitus with hyperglycemia, without long-term current use of insulin (HCC) (Chronic)    Monitored by PCP.  With theoretically consider SGL 2 inhibitor or GLP-1 agonist.  Defer to PCP.      Morbid obesity (HCC) (Chronic)    Discussed diet and exercise.  Exercise is difficult for her with her weakness, but will help with weight loss and help with dyspnea.      CKD (chronic kidney disease), stage III (HCC) (Chronic)   Relevant Orders   Basic metabolic panel (Completed)   S/P TAVR (transcatheter aortic valve replacement) (Chronic)   Relevant Orders   EKG 12-Lead (Completed)   Basic metabolic panel (Completed)   CBC (Completed)   Long term current use of amiodarone for rate-rhythm control of A. fib (Chronic)    Following ESR and CRP along with thyroid levels.  Needs annual eye exams.  Holding off on PFTs for now unless ESR CRP increased or other signs of dyspnea.  Reducing dose to  100 mg daily.      Hypercoagulable state due to atrial fibrillation (HCC) (Chronic)    Has bled score of 4 and a chads Vascor of 8-9, too high risk to a bleeding to continue with anticoagulation.  For now we will try to maintain sinus rhythm and avoid DOAC.      Relevant Orders   Basic metabolic panel (Completed)   CBC (Completed)    ===================================  HPI:    Mary Mccarthy is a 82 y.o. female with a PMH notable for PAF (h/o TIA/CVA - no longer on DOAC 2/2 recurrent GIB), Severe AS-s/p TAVR (complicated by CHB), HTN & HLD  with minimal CAD who presents today for 8 month f/u at the request of Biagio Borg, MD.  Severe AS => progressed to July 2022 -> presented with CHF in setting of New onset Afib-RVR & CVA TAVR Delayed until Afib & CVA treated along with CHF. = Amiodarone for Afib Rhythm control GI bleed delayed TAVR -> ? 2/2 NSAIDS + Eliquis, AVMs.  09/2020: R&LHC -mild diffuse CAD with 20 to 30%.  AOV mean gradient 53 mm. 10/26/2020: TAVR: 26 mm Medtronic Evolut Pro -> transient High Grade AVB -> followed with Zio Patch. => Post-op  x 1 month Echo AoC MG 11 mmHg.  Recurrent GI Bleed -> finally decided to d/c ASA & Eliquis, Not suitable candidate for MV Clip  CHA2DS2-VASc score 8; HAS-BLED =3 (H,S,B,E)   [name] was last seen on 05/02/2021:  Increased losartan to 50 mg. Noted fatigue / DOE & palpitations (no Afib).  2/2 recurrent GIB - no longer on DOAC; continue Amiodarone (ESR/CRP ordered) -> Referred to Dr. Burt Knack for ? [facility] in Sept 2022 -> Felt to be too high risk (even for short term DOAC post-procedure. (Had recurrent bleed on 2.5 mg BID Eliquis) Echo Ordered.  PRN Lasix, low wgt support stockings & foot elevation.  Recent Hospitalizations: None  Seen by Angelena Form, PA 11/09/2021 -> says he was feeling the best he had been in months.  No dyspnea or chest pain.  No edema.  No PND or orthopnea.  No sensation of A-fib.  She had intentionally lost weight, and feeling better. => NYHA Class II Sx.  SBE prophylaxis (for GI procedures discussed.  Stable on Amiodarone.   Reviewed  CV studies:    The following studies were reviewed today: (if available, images/films reviewed: From Epic Chart or Care Everywhere) TTE 11/09/2021:  normal LV function, 60 to 65%.  No RWMA.  GR 2 DD with moderately dilated LA.Marland Kitchen  Severe MAC with mild MS.  Normal RV function with mildly elevated PAP.  26 mm CoreValve TAVR (10/26/2020) MG of 9.2 mg.  Perivalvular leak noted.  No significant change from prior.  Mobile echodensity  in the RV not well visualized.  Interval History:   Kaoir Loree returns for follow-up today stating that she actually is feeling pretty well.  She had issues with taking iron supplementation with intermittent diarrhea and constipation.  She says over the last few months she has had a couple times where she is felt short little bursts of palpitations for less than 3 to 4 seconds but nothing prolonged.  Nothing like A-fib.  She actually says that she is feeling as well as she has in many many months.  Energy level is better. She is try to walk but has a hard time with balance.  She uses a cane but still has some difficulty with her left leg.  She is not having any PND, orthopnea or edema.  Sleeps with 2 pillows because of GERD.  No prolonged sensations to suspect A-fib.   Maybe once or twice a week she will take an extra dose of Lasix.  CV Review of Symptoms (Summary): Cardiovascular ROS: positive for - stable exertional dyspnea.  Palpitations that are short-lived.  Trivial swelling.  Left leg weakness limits walking, uses cane for balance. negative for - chest pain, orthopnea, paroxysmal nocturnal dyspnea, rapid heart rate, shortness of breath, or syncope/near syncope or TIA/amaurosis fugax, claudication; melena, hematochezia, hematuria or epistaxis.  REVIEWED OF SYSTEMS   Review of Systems  Constitutional:  Negative for malaise/fatigue (Energy level finally getting better.) and weight loss.  HENT:  Negative for congestion and nosebleeds.   Respiratory:  Negative for cough, hemoptysis and shortness of breath.   Gastrointestinal:  Negative for blood in stool and melena.  Genitourinary:  Negative for hematuria.  Musculoskeletal:  Positive for back pain and joint pain. Negative for falls.  Neurological:  Positive for focal weakness (Left leg is still weak.  Does not do bicycling well.  To walk with a cane.). Negative for dizziness (Just poor balance) and headaches.  Psychiatric/Behavioral:   Positive for memory loss. Negative for depression (Finally seems to be in good spirits.). The patient is nervous/anxious (Much less frequent anxiety attacks.  Very rarely uses Xanax.). The patient does not have insomnia.    I have reviewed and (if needed) personally updated the patient's problem list, medications, allergies, past medical and surgical history, social and family history.   PAST MEDICAL HISTORY   Past Medical History:  Diagnosis Date   ALLERGIC RHINITIS 06/24/2007   Anemia    AVM (arteriovenous malformation) of colon    Blood transfusion without reported diagnosis 05/2015   COLONIC POLYPS, HX OF 06/24/2007   Coronary artery disease, non-occlusive 05/2015   Trop + w/ Acute Anemia =>CATH: small RI - Ostial 60%, ostial RCA 30% and dLAD 40-50%;; 9/'21: Cor Ca Score 624. Mild (25-49%) prox RCA & LAD,; Moderate (50-69%) Ostial Small RI & prox LCx.     DEPRESSION 10/08/2006   DIABETES MELLITUS, TYPE II 10/05/2006   GERD 06/24/2007   History of CVA (cerebrovascular accident) 08/10/2020   08/2020- found to be in afib with RVR, started on Eliquis (shower of emboli from A. fib as complication of severe AS)   HYPERLIPIDEMIA 10/08/2006   HYPERTENSION 10/08/2006   INSOMNIA 09/24/2008   Left knee DJD    Nonsustained paroxysmal ventricular tachycardia ([place]) 10/2018   Post-TAVR Zio Patch: Frequent (206) runs of NSVT - Fastest 5 beats 239 bpm. Longest 9.3 Sec avg 135 bpm. (? possibly Afib w/ aberrancy)   Osteoporosis 03/10/2016   PAF (paroxysmal atrial fibrillation) (HCC)    PAF with RVR: CHA2DS2-VASc 9.  On Eliquis and amiodarone. 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 Angelena Form & Dr. Cyndia Bent   Severe aortic stenosis 08/2020   Progression from Mod-Severe AS to Severe AS - noted on Echo 08/2020 -> (mean gradient progressed from 34 to 42 mmHg) this was  in setting of new onset A. fib RVR, acute diastolic HF and shower of emboli CVA. ->  Referred for TAVR, completed 10/26/2020    PAST SURGICAL HISTORY   Past Surgical History:  Procedure Laterality Date   APPENDECTOMY     BIOPSY  10/21/2020   Procedure: BIOPSY;  Surgeon: Sharyn Creamer, MD;  [facility] ENDOSCOPY;  Service: Gastroenterology;;   BREAST BIOPSY     CARDIAC CATHETERIZATION N/A 05/21/2015   Procedure: Left Heart Cath and Coronary Angiography;  Surgeon: Leonie Man, MD;  Location: [place] CV LAB;  Service: Cardiovascular;: Ost RI 60%, Ost RCA 30%, dLAD tapers to small vessel w/ 40-50%. Mildly elevated LVEDP. Normal LV Fxn.   COLONOSCOPY N/A 05/20/2015   Procedure: COLONOSCOPY;  Surgeon: Manus Gunning, MD;  Location: [place];  Service: Gastroenterology;  Laterality: N/A;   CORONARY CA2+ SCORE / CARDIAC CT ANGIOGRAM  10/09/2019   Calcium score 624.  82nd percentile. Dominant RCA: Mild (25-49%) proximal stenosis-distal bifurcation into PDA and PAV--< RPL branches.  LAD (1 major mid vessel diagonal) diffuse calcified plaque, mild proximal stenosis with minimal distal stenosis.  Small RI moderate ostial disease.  LCx-moderate mixed (50-69%) proximal stenosis.  Small dOM1 disease.  Trileaflet AoV, annular Ca2+ - probable AS   ESOPHAGOGASTRODUODENOSCOPY N/A 05/20/2015   Procedure: ESOPHAGOGASTRODUODENOSCOPY (EGD);  Surgeon: Manus Gunning, MD;  Location: [place];  Service: Gastroenterology;  Laterality: N/A;   ESOPHAGOGASTRODUODENOSCOPY (EGD) WITH PROPOFOL N/A 10/21/2020   Procedure: ESOPHAGOGASTRODUODENOSCOPY (EGD) WITH PROPOFOL;  Surgeon: Sharyn Creamer, MD;  Location: [place];  Service: Gastroenterology;  Laterality: N/A;   INTRAOPERATIVE TRANSTHORACIC ECHOCARDIOGRAM N/A 10/26/2020   Procedure: INTRAOPERATIVE TRANSTHORACIC ECHOCARDIOGRAM;  Surgeon: Angelena Form; Location: [facility] OR; Pre-TAVR well-visualized calcified AoV mean Grad 37 mmHg, AVA 0.72 cm =>  Post TAVR well-positioned supra-annular 26 mm Medtronic Evolut Pro valve placed with no PVL.  Mean gradient 14 mmHg.  AVR 1.58 cm.  Normal flow to RCA and LM-LAD.  ; EF 60 to 65%.  Degenerative severe MAC w/ mild MR.   RIGHT/LEFT HEART CATH AND CORONARY ANGIOGRAPHY N/A 09/29/2020   Procedure: RIGHT/LEFT HEART CATH AND CORONARY ANGIOGRAPHY;  Surgeon: Burnell Blanks, MD;  Location: [place] CV LAB;  Service: Cardiovascular; pre-TAVR:  pRCA 20%, m-dRCA 30%. D LM-pLAD 20%. RI 30%, mLAD 20%.  Severe AS with mean gradient measured at 52.8 mmHg.   TONSILLECTOMY     TRANSCATHETER AORTIC VALVE REPLACEMENT, TRANSFEMORAL N/A 10/26/2020   Procedure: TRANSCATHETER AORTIC VALVE REPLACEMENT, TRANSFEMORAL;  Surgeon: Burnell Blanks, MD;  Location: [facility] OR;  Service: Open Heart Surgery;; Medtronic Evolut-Pro + (size 26 mm, model # EVPROPLUS -26US, serial # [id number]); transient high-grade AV block noted so PPM left in place.   TRANSTHORACIC ECHOCARDIOGRAM  03/17/2019   a) 03/2019: EF 60 to 65%.  Moderate LVH.  GRII DD.  Mod-Severe AS (m grad 36 mmHg, peak 59 mmHg); b) 09/2019: EF 65-70%, No RWMA. Gr1 DD/hi LAP, Mild hi PAP. Mod LA Dil. MOD AS (mean Grad 34.5 mmHg).  STABLE   TRANSTHORACIC ECHOCARDIOGRAM  08/27/2020   Admitted for CVA/Afib RVR & CHF (Progression to SEVERE SYMPTOMATIC AS):  Severe Calcific Aortic Stenosis (VTI AVA estimated 0.86 cm, mean gradient 42 mmHg, V-max 4.36 m/s).  EF 55 to 60%.  Severe concentric LVH.  Unable to determine diastolic parameters.  Moderately elevated PAP.  Mild LA dilation.  Mild circumferential pericardial effusion.  Trivial MR.  Severe MAC.   TRANSTHORACIC ECHOCARDIOGRAM  11/25/2020   1 Month s/p TAVR: EF 55 to 60%.  No R WMA.  GR 1 DD.  Elevated LVEDP.  Normal RV size with mildly elevated RVP estimated 44 mmHg.  Oscillating density in the RV suspect calcified chordal apparatus versus calcified thrombus.  Moderate LA dilation.  Normal  IVC/RA P => Well-positioned 26  mm Medtronic Evolut Pro THVwith mean AOV gradient 11 mmHg.  Trivial PVL   TRANSTHORACIC ECHOCARDIOGRAM  11/02/2020   a) Day 1 Post-op TAVR 10/27/20:  Well-Positioned Supra Annular Medtronic Evolut Pro THP.  Mean gradient 12 mmHg, peak 24 mmHg.  AVA 1.8 cm.  Trivial PVL.  EF 60 to 65%.  Normal LV function.  Mild LVH.  Severe LA dilation.  Mild RA dilation.  Severe MAC;; b) 11/02/20: Normal structure and function of the aortic valve prosthesis.  Mean gradient 9 mmHg (otherwise stable)   TUBAL LIGATION      Immunization History  Administered Date(s) Administered   Fluad Quad(high Dose 65+) 12/26/2018, 12/06/2019, 10/13/2020   Influenza Split 12/21/2010, 12/28/2011   Influenza Whole 12/01/2005, 03/26/2009, 12/07/2009   Influenza, High Dose Seasonal PF 11/19/2012, 11/18/2013, 02/18/2015, 12/28/2015, 12/07/2016, 10/17/2017   PFIZER(Purple Top)SARS-COV-2 Vaccination 05/01/2019, 05/26/2019, 05/12/2020   Pneumococcal Conjugate-13 12/03/2012   Pneumococcal Polysaccharide-23 11/06/2005   Td 09/24/2008   Tdap 09/13/2018, 09/13/2018    MEDICATIONS/ALLERGIES   Current Meds  Medication Sig   acetaminophen (TYLENOL) 500 MG tablet Take 500 mg by mouth every 6 (six) hours as needed for moderate pain.   ALPRAZolam (XANAX) 0.25 MG tablet TAKE 1 TABLET BY MOUTH THREE TIMES A DAY AS NEEDED FOR ANXIETY   amiodarone (PACERONE) 200 MG tablet TAKE 1 TABLET BY MOUTH EVERY DAY   amLODipine (NORVASC) 10 MG tablet Take 1 tablet (10 mg total) by mouth daily.   atorvastatin (LIPITOR) 40 MG tablet TAKE 1 TABLET BY MOUTH EVERY DAY   blood glucose meter kit and supplies Dispense based on patient and insurance preference. Use up to four times daily as directed. E11.9   Blood Glucose Monitoring Suppl (ONE TOUCH ULTRA 2) w/Device KIT Use as directed daily   citalopram (CELEXA) 10 MG tablet Take 1 tablet (10 mg total) by mouth daily.   cyanocobalamin ([employer] VITAMIN B12) 1000 MCG tablet TAKE 1 TABLET BY MOUTH EVERY DAY    furosemide (LASIX) 40 MG tablet TAKE 1 TABLET BY MOUTH EVERY DAY   iron polysaccharides (NU-IRON) 150 MG capsule Take 1 capsule (150 mg total) by mouth 2 (two) times daily.   Lancets (ONETOUCH DELICA PLUS [id number]) MISC USE AS DIRECTED TEST 1-2 TIMES A DAY   levothyroxine (SYNTHROID) 25 MCG tablet Take 1 tablet (25 mcg total) by mouth daily.   losartan (COZAAR) 100 MG tablet Take 1 tablet (100 mg total) by mouth daily.   metFORMIN (GLUCOPHAGE) 1000 MG tablet TAKE 1 TAB BY MOUTH IN THE AM,AND 1/2 TAB IN THE PM (Patient taking differently: Take 500 mg by mouth 2 (two) times daily with a meal. TAKE 1 TAB BY MOUTH IN THE AM,AND 1/2 TAB IN THE PM)   metoprolol succinate (TOPROL-XL) 25 MG 24 hr tablet Take 0.5 tablets (12.5 mg total) by mouth daily.   mirtazapine (REMERON) 15 MG tablet TAKE 1 TABLET BY MOUTH EVERYDAY AT BEDTIME   ONETOUCH ULTRA test strip USE AS INSTRUCTED TEST 1-2 TIMES A DAY   pantoprazole (PROTONIX) 40 MG tablet TAKE 1 TABLET BY MOUTH TWICE A DAY   potassium chloride SA (KLOR-CON M) 20 MEQ tablet Take 1 tablet (20 mEq total) by mouth daily.    Allergies  Allergen Reactions   Ibuprofen Other (See Comments)    Bleeding events    SOCIAL HISTORY/FAMILY HISTORY   Reviewed in Epic:  Pertinent findings:  Social History   Tobacco Use   Smoking status: Never  Smokeless tobacco: Never  Vaping Use   Vaping Use: Never used  Substance Use Topics   Alcohol use: No    Alcohol/week: 0.0 standard drinks of alcohol   Drug use: No   Social History   Social History Narrative   Recently widowed--husband died in 11-22-18.      Now lives alone but her son usually stays with her at night.  During the day she has 2 nephews who live nearby to come in and check on her intermittently.  Also one of her nieces calls routinely.      When her son is home and awake, she may try to walk on a treadmill, but is scared to walk outside.    OBJCTIVE -PE, EKG, labs   Wt Readings from Last  3 Encounters:  01/02/22 191 lb 9.6 oz (86.9 kg)  11/09/21 185 lb (83.9 kg)  09/23/21 184 lb (83.5 kg)    Physical Exam: BP (!) 170/62   Pulse (!) 47   Ht _0  (1.549 m)   Wt 191 lb 9.6 oz (86.9 kg)   SpO2 93%   BMI 36.20 kg/m  Physical Exam Vitals (REcheck BP 174/68 mmHg; is usually much better) reviewed.  Constitutional:      General: She is not in acute distress.    Appearance: Normal appearance. She is obese. She is not ill-appearing or toxic-appearing.  HENT:     Head: Normocephalic and atraumatic.  Neck:     Vascular: No carotid bruit or JVD.  Cardiovascular:     Rate and Rhythm: Regular rhythm. Bradycardia present. No extrasystoles are present.    Chest Wall: PMI is not displaced.     Pulses: Normal pulses.     Heart sounds: Murmur (Harsh 2/6 SEM at RUSB-neck) heard.     No friction rub. No gallop.  Pulmonary:     Effort: Pulmonary effort is normal. No respiratory distress.     Breath sounds: Normal breath sounds. No wheezing, rhonchi or rales.  Chest:     Chest wall: No tenderness.  Musculoskeletal:        General: Swelling (Trivial ankle) present. Normal range of motion.     Cervical back: Normal range of motion and neck supple.  Skin:    General: Skin is warm and dry.     Coloration: Skin is not pale.  Neurological:     General: No focal deficit present.     Mental Status: She is alert and oriented to person, place, and time.     Gait: Gait abnormal.  Psychiatric:        Mood and Affect: Mood normal.        Behavior: Behavior normal.        Thought Content: Thought content normal.        Judgment: Judgment normal.     Comments: In much better spirits today.      Adult ECG Report  Rate: 47 ;  Rhythm: sinus bradycardia and CRO AMi - age indeterminate; normal axis, intervals & durtions ;   Narrative Interpretation: stable  Recent Labs: Reviewed.  No change to meds. Lab Results  Component Value Date   CHOL 98 09/23/2021   HDL 35.90 (L) 09/23/2021    LDLCALC 45 09/23/2021   TRIG 87.0 09/23/2021   CHOLHDL 3 09/23/2021   Lab Results  Component Value Date   CREATININE 1.51 (H) 01/02/2022   BUN 28 (H) 01/02/2022   NA 147 (H) 01/02/2022   K 5.1 01/02/2022  CL 106 01/02/2022   CO2 26 01/02/2022      Latest Ref Rng & Units 01/02/2022    2:33 PM 05/24/2021    1:50 PM 04/20/2021    1:52 PM  CBC  WBC 3.4 - 10.8 x10E3/uL 4.6  6.8  6.3   Hemoglobin 11.1 - 15.9 g/dL 12.2  8.5  C 8.3 Repeated and verified X2.   Hematocrit 34.0 - 46.6 % 35.7  27.8  27.0   Platelets 150 - 450 x10E3/uL 196  259.0  222.0     C Corrected result    Lab Results  Component Value Date   HGBA1C 6.5 09/23/2021   Lab Results  Component Value Date   TSH 5.47 09/23/2021    ================================================== I spent a total of 29 minutes with the patient spent in direct patient consultation.  Additional time spent with chart review  / charting (studies, outside notes, etc): 28 min Total Time: 57 min  Current medicines are reviewed at length with the patient today.  (+/- concerns) n/a  Notice: This dictation was prepared with Dragon dictation along with smart phrase technology. Any transcriptional errors that result from this process are unintentional and may not be corrected upon review.  Studies Ordered:   Orders Placed This Encounter  Procedures   Basic metabolic panel   CBC   EKG 12-Lead   No orders of the defined types were placed in this encounter.   Patient Instructions / Medication Changes & Studies & Tests Ordered   Patient Instructions  Medication Instructions:   No changes   *If you need a refill on your cardiac medications before your next appointment, please call your pharmacy*   Lab Work:today  BMP CBC   If you have labs (blood work) drawn today and your tests are completely normal, you will receive your results only by: MyChart Message (if you have MyChart) OR A paper copy in the mail If you have any lab  test that is abnormal or we need to change your treatment, we will call you to review the results.   Testing/Procedures:    Follow-Up: At [facility], you and your health needs are our priority.  As part of our continuing mission to provide you with exceptional heart care, we have created designated Provider Care Teams.  These Care Teams include your primary Cardiologist (physician) and Advanced Practice Providers (APPs -  Physician Assistants and Nurse Practitioners) who all work together to provide you with the care you need, when you need it.  We recommend signing up for the patient portal called "MyChart".  Sign up information is provided on this After Visit Summary.  MyChart is used to connect with patients for Virtual Visits (Telemedicine).  Patients are able to view lab/test results, encounter notes, upcoming appointments, etc.  Non-urgent messages can be sent to your provider as well.   To learn more about what you can do with MyChart, go to [email or web address].    Your next appointment:   6 month(s)  The format for your next appointment:   In Person  Provider:   Glenetta Hew, MD       Leonie Man, MD, MS Glenetta Hew, M.D., M.S. Interventional Cardiologist  [place]  Pager # [phone] Phone # [phone] [address]. [place], [place] [address]   Thank you for choosing [facility] [place]!!

## 2022-01-03 LAB — BASIC METABOLIC PANEL
BUN/Creatinine Ratio: 19 (ref 12–28)
BUN: 28 mg/dL — ABNORMAL HIGH (ref 8–27)
CO2: 26 mmol/L (ref 20–29)
Calcium: 10.5 mg/dL — ABNORMAL HIGH (ref 8.7–10.3)
Chloride: 106 mmol/L (ref 96–106)
Creatinine, Ser: 1.51 mg/dL — ABNORMAL HIGH (ref 0.57–1.00)
Glucose: 99 mg/dL (ref 70–99)
Potassium: 5.1 mmol/L (ref 3.5–5.2)
Sodium: 147 mmol/L — ABNORMAL HIGH (ref 134–144)
eGFR: 34 mL/min/{1.73_m2} — ABNORMAL LOW (ref >59)

## 2022-01-03 LAB — CBC
Hematocrit: 35.7 % (ref 34.0–46.6)
Hemoglobin: 12.2 g/dL (ref 11.1–15.9)
MCH: 34.1 pg — ABNORMAL HIGH (ref 26.6–33.0)
MCHC: 34.2 g/dL (ref 31.5–35.7)
MCV: 100 fL — ABNORMAL HIGH (ref 79–97)
Platelets: 196 10*3/uL (ref 150–450)
RBC: 3.58 x10E6/uL — ABNORMAL LOW (ref 3.77–5.28)
RDW: 12.2 % (ref 11.7–15.4)
WBC: 4.6 10*3/uL (ref 3.4–10.8)

## 2022-01-22 ENCOUNTER — Encounter: Payer: Self-pay | Admitting: Cardiology

## 2022-01-22 NOTE — Assessment & Plan Note (Signed)
Discussed diet and exercise.  Exercise is difficult for her with her weakness, but will help with weight loss and help with dyspnea.

## 2022-01-22 NOTE — Assessment & Plan Note (Signed)
Thankfully, no breakthrough spells of A-fib on amiodarone.  She still has bradycardia and some fatigue.  CHA2DS2-VASc score was almost 9 and has bled score is 3-4.  Plan was to avoid any anticoagulation going forward.  Plan: Continue rhythm control with amiodarone, but reduce dose to 100 mg daily No aspirin or DOAC as noted Continue low-dose beta-blocker

## 2022-01-22 NOTE — Assessment & Plan Note (Signed)
Following ESR and CRP along with thyroid levels.  Needs annual eye exams.  Holding off on PFTs for now unless ESR CRP increased or other signs of dyspnea.  Reducing dose to 100 mg daily.

## 2022-01-22 NOTE — Assessment & Plan Note (Signed)
Has bled score of 4 and a chads Vascor of 8-9, too high risk to a bleeding to continue with anticoagulation.  For now we will try to maintain sinus rhythm and avoid DOAC.

## 2022-01-22 NOTE — Assessment & Plan Note (Addendum)
Blood pressure is really high today.  She says usually at home is 130s over 60s.  I think a lot of this is whitecoat related.  Since she is feeling well, I am reluctant to really push things too much.  She is already on high-dose of atorvastatin and losartan.  I will be very leery of doing a thiazide diuretic in setting of pending dose of furosemide. On low-dose Toprol which we could potentially increase or convert to carvedilol depending on what her heart rate does with reducing amiodarone.

## 2022-01-22 NOTE — Assessment & Plan Note (Signed)
Chronic HFpEF following TAVR.  Not unexpectedly there is diastolic function.  She is on standard furosemide and maintaining relatively stable volume level.  Is on low-dose beta-blocker and high-dose ARB (could directly convert to a more potent ARB) along with amlodipine 10 mg daily..  At the risk of not wanting to eat increase her polypharmacy I am can hold off on adding additional medication.  Can follow-up closely.  Needs BMP and CMP checked today.

## 2022-01-22 NOTE — Assessment & Plan Note (Signed)
Monitored by PCP.  With theoretically consider SGL 2 inhibitor or GLP-1 agonist.  Defer to PCP.

## 2022-02-13 ENCOUNTER — Other Ambulatory Visit: Payer: Self-pay | Admitting: Internal Medicine

## 2022-02-20 ENCOUNTER — Telehealth: Payer: Self-pay | Admitting: Internal Medicine

## 2022-02-20 DIAGNOSIS — E559 Vitamin D deficiency, unspecified: Secondary | ICD-10-CM

## 2022-02-20 DIAGNOSIS — E538 Deficiency of other specified B group vitamins: Secondary | ICD-10-CM

## 2022-02-20 DIAGNOSIS — E1165 Type 2 diabetes mellitus with hyperglycemia: Secondary | ICD-10-CM

## 2022-02-20 NOTE — Telephone Encounter (Signed)
Pt has 6 month f/u 2/19 and lab appt 2/7.  Please enter orders for labs.

## 2022-02-20 NOTE — Telephone Encounter (Signed)
Ok - done - thanks

## 2022-02-21 ENCOUNTER — Telehealth: Payer: Self-pay | Admitting: Cardiology

## 2022-02-21 MED ORDER — AMLODIPINE BESYLATE 10 MG PO TABS: 10.0000 mg | ORAL_TABLET | Freq: Every day | ORAL | 3 refills | Status: DC

## 2022-02-21 NOTE — Telephone Encounter (Signed)
*  STAT* If patient is at the pharmacy, call can be transferred to refill team.   1. Which medications need to be refilled? (please list name of each medication and dose if known) amLODipine (NORVASC) 10 MG tablet   2. Which pharmacy/location (including street and city if local pharmacy) is medication to be sent to?  [id number] - [place], [place] - [address] AT [place]    3. Do they need a 30 day or 90 day supply? [place]

## 2022-02-21 NOTE — Telephone Encounter (Signed)
Patient stated she has been out of amlodipine for 2 weeks. She said her pharmacy was to call our clinic for refill orders. Explained to patient that when she is down to 1 week's supply of medication, to call our clinic for refills. While on phone, BP 152/57, P 53. Refill for amlopidine 10mg  daily sent to her preferred pharmacy.

## 2022-03-08 ENCOUNTER — Encounter: Payer: Self-pay | Admitting: Internal Medicine

## 2022-03-15 ENCOUNTER — Other Ambulatory Visit: Payer: Self-pay | Admitting: Internal Medicine

## 2022-03-15 ENCOUNTER — Other Ambulatory Visit (INDEPENDENT_AMBULATORY_CARE_PROVIDER_SITE_OTHER): Payer: 59

## 2022-03-15 DIAGNOSIS — E538 Deficiency of other specified B group vitamins: Secondary | ICD-10-CM | POA: Diagnosis not present

## 2022-03-15 DIAGNOSIS — E559 Vitamin D deficiency, unspecified: Secondary | ICD-10-CM | POA: Diagnosis not present

## 2022-03-15 DIAGNOSIS — E1165 Type 2 diabetes mellitus with hyperglycemia: Secondary | ICD-10-CM | POA: Diagnosis not present

## 2022-03-15 LAB — LIPID PANEL
Cholesterol: 97 mg/dL (ref 0–200)
HDL: 35.4 mg/dL — ABNORMAL LOW (ref >39.00)
LDL Cholesterol: 46 mg/dL (ref 0–99)
NonHDL: 62
Total CHOL/HDL Ratio: 3
Triglycerides: 81 mg/dL (ref 0.0–149.0)
VLDL: 16.2 mg/dL (ref 0.0–40.0)

## 2022-03-15 LAB — CBC WITH DIFFERENTIAL/PLATELET
Basophils Absolute: 0 10*3/uL (ref 0.0–0.1)
Basophils Relative: 0.3 % (ref 0.0–3.0)
Eosinophils Absolute: 0.1 10*3/uL (ref 0.0–0.7)
Eosinophils Relative: 2.4 % (ref 0.0–5.0)
HCT: 34.4 % — ABNORMAL LOW (ref 36.0–46.0)
Hemoglobin: 11.6 g/dL — ABNORMAL LOW (ref 12.0–15.0)
Lymphocytes Relative: 31.3 % (ref 12.0–46.0)
Lymphs Abs: 1.5 10*3/uL (ref 0.7–4.0)
MCHC: 33.7 g/dL (ref 30.0–36.0)
MCV: 98.9 fl (ref 78.0–100.0)
Monocytes Absolute: 0.4 10*3/uL (ref 0.1–1.0)
Monocytes Relative: 8.3 % (ref 3.0–12.0)
Neutro Abs: 2.7 10*3/uL (ref 1.4–7.7)
Neutrophils Relative %: 57.7 % (ref 43.0–77.0)
Platelets: 184 10*3/uL (ref 150.0–400.0)
RBC: 3.47 Mil/uL — ABNORMAL LOW (ref 3.87–5.11)
RDW: 13.1 % (ref 11.5–15.5)
WBC: 4.8 10*3/uL (ref 4.0–10.5)

## 2022-03-15 LAB — URINALYSIS, ROUTINE W REFLEX MICROSCOPIC
Bilirubin Urine: NEGATIVE
Hgb urine dipstick: NEGATIVE
Ketones, ur: NEGATIVE
Nitrite: NEGATIVE
Specific Gravity, Urine: 1.025 (ref 1.000–1.030)
Total Protein, Urine: NEGATIVE
Urine Glucose: NEGATIVE
Urobilinogen, UA: 0.2 (ref 0.0–1.0)
pH: 5.5 (ref 5.0–8.0)

## 2022-03-15 LAB — BASIC METABOLIC PANEL
BUN: 35 mg/dL — ABNORMAL HIGH (ref 6–23)
CO2: 29 mEq/L (ref 19–32)
Calcium: 9.9 mg/dL (ref 8.4–10.5)
Chloride: 103 mEq/L (ref 96–112)
Creatinine, Ser: 2.3 mg/dL — ABNORMAL HIGH (ref 0.40–1.20)
GFR: 19.18 mL/min — ABNORMAL LOW (ref >60.00)
Glucose, Bld: 202 mg/dL — ABNORMAL HIGH (ref 70–99)
Potassium: 4.8 mEq/L (ref 3.5–5.1)
Sodium: 139 mEq/L (ref 135–145)

## 2022-03-15 LAB — VITAMIN D 25 HYDROXY (VIT D DEFICIENCY, FRACTURES): VITD: 14.42 ng/mL — ABNORMAL LOW (ref 30.00–100.00)

## 2022-03-15 LAB — VITAMIN B12: Vitamin B-12: 936 pg/mL — ABNORMAL HIGH (ref 211–911)

## 2022-03-15 LAB — MICROALBUMIN / CREATININE URINE RATIO
Creatinine,U: 181.9 mg/dL
Microalb Creat Ratio: 0.7 mg/g (ref 0.0–30.0)
Microalb, Ur: 1.3 mg/dL (ref 0.0–1.9)

## 2022-03-15 LAB — HEPATIC FUNCTION PANEL
ALT: 17 U/L (ref 0–35)
AST: 20 U/L (ref 0–37)
Albumin: 3.9 g/dL (ref 3.5–5.2)
Alkaline Phosphatase: 45 U/L (ref 39–117)
Bilirubin, Direct: 0.2 mg/dL (ref 0.0–0.3)
Total Bilirubin: 0.7 mg/dL (ref 0.2–1.2)
Total Protein: 7 g/dL (ref 6.0–8.3)

## 2022-03-15 LAB — HEMOGLOBIN A1C: Hgb A1c MFr Bld: 6.5 % (ref 4.6–6.5)

## 2022-03-15 LAB — TSH: TSH: 4.73 u[IU]/mL (ref 0.35–5.50)

## 2022-03-15 MED ORDER — CEPHALEXIN 500 MG PO CAPS: 500.0000 mg | ORAL_CAPSULE | Freq: Three times a day (TID) | ORAL | 0 refills | Status: DC

## 2022-03-16 ENCOUNTER — Other Ambulatory Visit: Payer: Self-pay | Admitting: Physician Assistant

## 2022-03-24 ENCOUNTER — Other Ambulatory Visit: Payer: Self-pay | Admitting: Cardiovascular Disease

## 2022-03-27 ENCOUNTER — Ambulatory Visit (INDEPENDENT_AMBULATORY_CARE_PROVIDER_SITE_OTHER): Payer: 59 | Admitting: Internal Medicine

## 2022-03-27 VITALS — BP 154/78 | HR 87 | Temp 97.7°F | Ht 61.0 in | Wt 192.0 lb

## 2022-03-27 DIAGNOSIS — E538 Deficiency of other specified B group vitamins: Secondary | ICD-10-CM

## 2022-03-27 DIAGNOSIS — E785 Hyperlipidemia, unspecified: Secondary | ICD-10-CM

## 2022-03-27 DIAGNOSIS — I10 Essential (primary) hypertension: Secondary | ICD-10-CM | POA: Diagnosis not present

## 2022-03-27 DIAGNOSIS — E1169 Type 2 diabetes mellitus with other specified complication: Secondary | ICD-10-CM

## 2022-03-27 DIAGNOSIS — N390 Urinary tract infection, site not specified: Secondary | ICD-10-CM

## 2022-03-27 DIAGNOSIS — E559 Vitamin D deficiency, unspecified: Secondary | ICD-10-CM | POA: Diagnosis not present

## 2022-03-27 DIAGNOSIS — Z0001 Encounter for general adult medical examination with abnormal findings: Secondary | ICD-10-CM | POA: Diagnosis not present

## 2022-03-27 DIAGNOSIS — R350 Frequency of micturition: Secondary | ICD-10-CM

## 2022-03-27 DIAGNOSIS — E119 Type 2 diabetes mellitus without complications: Secondary | ICD-10-CM

## 2022-03-27 MED ORDER — SOLIFENACIN SUCCINATE 5 MG PO TABS: 5.0000 mg | ORAL_TABLET | Freq: Every day | ORAL | 3 refills | Status: DC

## 2022-03-27 NOTE — Patient Instructions (Signed)
Please have your Shingrix (shingles) shots done at your local pharmacy.  Please remember to call for your next eye appointment soon  Please take all new medication as prescribed - the generic Vesicare 5 mg per day for bladder  Please call if you change your mind about Physical Therapy at home  Please continue all other medications as before, and refills have been done if requested.  Please have the pharmacy call with any other refills you may need.  Please continue your efforts at being more active, low cholesterol diet, and weight control.  You are otherwise up to date with prevention measures today.  Please keep your appointments with your specialists as you may have planned - Dr Ellyn Hack  Please make an Appointment to return in 6 months, or sooner if needed, also with Lab Appointment for testing done 3-5 days before at the [facility] (so this is for TWO appointments - please see the scheduling desk as you leave)

## 2022-03-27 NOTE — Progress Notes (Unsigned)
Patient ID: Mary Mccarthy, female   DOB: [birth year], 82 y.o.   MRN: [id number]         Chief Complaint:: wellness exam and htn, gait disorder, uti, chronic diarrhea, OAB       HPI:  Mary Mccarthy is a 82 y.o. female here for wellness exam; declines flu shot, for shingrix at pharmacy, pt to call for eye exam soon, o/w up to date               Also lost her insurance card and upset today, nephew died recently as homeless person.  checks her BP daily at home with SBP in the 150's often.  Has cardiology f/u in may 2024 with Dr Ellyn Hack, and delcines any changes today in med tx. Pt denies chest pain, increased sob or doe, wheezing, orthopnea, PND, increased LE swelling, palpitations, dizziness or syncope.   Pt denies polydipsia, polyuria, or new focal neuro s/s, but gait has overall been worsening, no falls however.    Pt denies fever, wt loss, night sweats, loss of appetite, or other constitutional symptoms  Does have urinary frequency for over 1 mo but Denies urinary symptoms such as dysuria, urgency, flank pain, hematuria or n/v, fever, chills.  Tolerating antibx for probable uti started last wk.  Denies worsening reflux, abd pain, dysphagia, n/v, or blood but has had chronic diarrhea for over 1 month.   Wt Readings from Last 3 Encounters:  03/27/22 192 lb (87.1 kg)  01/02/22 191 lb 9.6 oz (86.9 kg)  11/09/21 185 lb (83.9 kg)   BP Readings from Last 3 Encounters:  03/27/22 (!) 154/78  01/02/22 (!) 170/62  11/09/21 124/64   Immunization History  Administered Date(s) Administered   Fluad Quad(high Dose 65+) 12/26/2018, 12/06/2019, 10/13/2020   Influenza Split 12/21/2010, 12/28/2011   Influenza Whole 12/01/2005, 03/26/2009, 12/07/2009   Influenza, High Dose Seasonal PF 11/19/2012, 11/18/2013, 02/18/2015, 12/28/2015, 12/07/2016, 10/17/2017   PFIZER(Purple Top)SARS-COV-2 Vaccination 05/01/2019, 05/26/2019, 05/12/2020   Pneumococcal Conjugate-13 12/03/2012   Pneumococcal Polysaccharide-23  11/06/2005   Td 09/24/2008   Tdap 09/13/2018, 09/13/2018   Health Maintenance Due  Topic Date Due   Medicare Annual Wellness (AWV)  05/10/2022      Past Medical History:  Diagnosis Date   ALLERGIC RHINITIS 06/24/2007   Anemia    AVM (arteriovenous malformation) of colon    Blood transfusion without reported diagnosis 05/2015   COLONIC POLYPS, HX OF 06/24/2007   Coronary artery disease, non-occlusive 05/2015   Trop + w/ Acute Anemia =>CATH: small RI - Ostial 60%, ostial RCA 30% and dLAD 40-50%;; 9/'21: Cor Ca Score 624. Mild (25-49%) prox RCA & LAD,; Moderate (50-69%) Ostial Small RI & prox LCx.     DEPRESSION 10/08/2006   DIABETES MELLITUS, TYPE II 10/05/2006   GERD 06/24/2007   History of CVA (cerebrovascular accident) 08/10/2020   08/2020- found to be in afib with RVR, started on Eliquis (shower of emboli from A. fib as complication of severe AS)   HYPERLIPIDEMIA 10/08/2006   HYPERTENSION 10/08/2006   INSOMNIA 09/24/2008   Left knee DJD    Nonsustained paroxysmal ventricular tachycardia ([place]) 10/2018   Post-TAVR Zio Patch: Frequent (206) runs of NSVT - Fastest 5 beats 239 bpm. Longest 9.3 Sec avg 135 bpm. (? possibly Afib w/ aberrancy)   Osteoporosis 03/10/2016   PAF (paroxysmal atrial fibrillation) (HCC)    PAF with RVR: CHA2DS2-VASc 9.  On Eliquis and amiodarone. 08/11/2020   PEPTIC ULCER DISEASE 10/08/2006   S/P TAVR (transcatheter aortic  valve replacement) 10/26/2020   s/p TAVR with a 26 mm Medtronic Evolut Pro+ via the TF approach by Dr. Angelena Form & Dr. Cyndia Bent   Severe aortic stenosis 08/2020   Progression from Mod-Severe AS to Severe AS - noted on Echo 08/2020 -> (mean gradient progressed from 34 to 42 mmHg) this was in setting of new onset A. fib RVR, acute diastolic HF and shower of emboli CVA. ->  Referred for TAVR, completed 10/26/2020   Past Surgical History:  Procedure Laterality Date   APPENDECTOMY     BIOPSY  10/21/2020   Procedure: BIOPSY;  Surgeon: Sharyn Creamer, MD;  Location: [facility] ENDOSCOPY;  Service: Gastroenterology;;   BREAST BIOPSY     CARDIAC CATHETERIZATION N/A 05/21/2015   Procedure: Left Heart Cath and Coronary Angiography;  Surgeon: Leonie Man, MD;  Location: [facility] CV LAB;  Service: Cardiovascular;: Ost RI 60%, Ost RCA 30%, dLAD tapers to small vessel w/ 40-50%. Mildly elevated LVEDP. Normal LV Fxn.   COLONOSCOPY N/A 05/20/2015   Procedure: COLONOSCOPY;  Surgeon: Manus Gunning, MD;  Location: [facility];  Service: Gastroenterology;  Laterality: N/A;   CORONARY CA2+ SCORE / CARDIAC CT ANGIOGRAM  10/09/2019   Calcium score 624.  82nd percentile. Dominant RCA: Mild (25-49%) proximal stenosis-distal bifurcation into PDA and PAV--< RPL branches.  LAD (1 major mid vessel diagonal) diffuse calcified plaque, mild proximal stenosis with minimal distal stenosis.  Small RI moderate ostial disease.  LCx-moderate mixed (50-69%) proximal stenosis.  Small dOM1 disease.  Trileaflet AoV, annular Ca2+ - probable AS   ESOPHAGOGASTRODUODENOSCOPY N/A 05/20/2015   Procedure: ESOPHAGOGASTRODUODENOSCOPY (EGD);  Surgeon: Manus Gunning, MD;  Location: [place];  Service: Gastroenterology;  Laterality: N/A;   ESOPHAGOGASTRODUODENOSCOPY (EGD) WITH PROPOFOL N/A 10/21/2020   Procedure: ESOPHAGOGASTRODUODENOSCOPY (EGD) WITH PROPOFOL;  Surgeon: Sharyn Creamer, MD;  Location: [place] [facility];  Service: Gastroenterology;  Laterality: N/A;   INTRAOPERATIVE TRANSTHORACIC ECHOCARDIOGRAM N/A 10/26/2020   Procedure: INTRAOPERATIVE TRANSTHORACIC ECHOCARDIOGRAM;  Surgeon: Angelena Form; Location: [facility] OR; Pre-TAVR well-visualized calcified AoV mean Grad 37 mmHg, AVA 0.72 cm => Post TAVR well-positioned supra-annular 26 mm Medtronic Evolut Pro valve placed with no PVL.  Mean gradient 14 mmHg.  AVR 1.58 cm.  Normal flow to RCA and LM-LAD.  ; EF 60 to 65%.  Degenerative severe MAC w/ mild MR.   RIGHT/LEFT HEART CATH AND CORONARY ANGIOGRAPHY N/A 09/29/2020    Procedure: RIGHT/LEFT HEART CATH AND CORONARY ANGIOGRAPHY;  Surgeon: Burnell Blanks, MD;  Location: [facility];  Service: Cardiovascular; pre-TAVR:  pRCA 20%, m-dRCA 30%. D LM-pLAD 20%. RI 30%, mLAD 20%.  Severe AS with mean gradient measured at 52.8 mmHg.   TONSILLECTOMY     TRANSCATHETER AORTIC VALVE REPLACEMENT, TRANSFEMORAL N/A 10/26/2020   Procedure: TRANSCATHETER AORTIC VALVE REPLACEMENT, TRANSFEMORAL;  Surgeon: Burnell Blanks, MD;  Location: [facility] OR;  Service: Open Heart Surgery;; Medtronic Evolut-Pro + (size 26 mm, model # EVPROPLUS -26US, serial # [id number]); transient high-grade AV block noted so PPM left in place.   TRANSTHORACIC ECHOCARDIOGRAM  03/17/2019   a) 03/2019: EF 60 to 65%.  Moderate LVH.  GRII DD.  Mod-Severe AS (m grad 36 mmHg, peak 59 mmHg); b) 09/2019: EF 65-70%, No RWMA. Gr1 DD/hi LAP, Mild hi PAP. Mod LA Dil. MOD AS (mean Grad 34.5 mmHg).  STABLE   TRANSTHORACIC ECHOCARDIOGRAM  08/27/2020   Admitted for CVA/Afib RVR & CHF (Progression to SEVERE SYMPTOMATIC AS):  Severe Calcific Aortic Stenosis (VTI AVA estimated 0.86 cm, mean  gradient 42 mmHg, V-max 4.36 m/s).  EF 55 to 60%.  Severe concentric LVH.  Unable to determine diastolic parameters.  Moderately elevated PAP.  Mild LA dilation.  Mild circumferential pericardial effusion.  Trivial MR.  Severe MAC.   TRANSTHORACIC ECHOCARDIOGRAM  11/25/2020   1 Month s/p TAVR: EF 55 to 60%.  No R WMA.  GR 1 DD.  Elevated LVEDP.  Normal RV size with mildly elevated RVP estimated 44 mmHg.  Oscillating density in the RV suspect calcified chordal apparatus versus calcified thrombus.  Moderate LA dilation.  Normal IVC/RA P => Well-positioned 26 mm Medtronic Evolut Pro THVwith mean AOV gradient 11 mmHg.  Trivial PVL   TRANSTHORACIC ECHOCARDIOGRAM  11/02/2020   a) Day 1 Post-op TAVR 10/27/20:  Well-Positioned Supra Annular Medtronic Evolut Pro THP.  Mean gradient 12 mmHg, peak 24 mmHg.  AVA 1.8 cm.  Trivial PVL.  EF 60 to 65%.   Normal LV function.  Mild LVH.  Severe LA dilation.  Mild RA dilation.  Severe MAC;; b) 11/02/20: Normal structure and function of the aortic valve prosthesis.  Mean gradient 9 mmHg (otherwise stable)   TUBAL LIGATION      reports that she has never smoked. She has never used smokeless tobacco. She reports that she does not drink alcohol and does not use drugs. family history includes Alcohol abuse in an other family member; Arrhythmia in her sister; Arthritis in an other family member; CAD in her sister; Cancer in her mother; Depression in an other family member; Diabetes in an other family member; Heart attack (age of onset: 63) in her sister; Heart disease in an other family member; Hyperlipidemia in an other family member; Hypertension in an other family member; Stroke in an other family member. Allergies  Allergen Reactions   Ibuprofen Other (See Comments)    Bleeding events   Current Outpatient Medications on File Prior to Visit  Medication Sig Dispense Refill   acetaminophen (TYLENOL) 500 MG tablet Take 500 mg by mouth every 6 (six) hours as needed for moderate pain.     ALPRAZolam (XANAX) 0.25 MG tablet TAKE 1 TABLET BY MOUTH THREE TIMES A DAY AS NEEDED FOR ANXIETY 60 tablet 2   amiodarone (PACERONE) 200 MG tablet TAKE 1 TABLET BY MOUTH EVERY DAY 90 tablet 3   amLODipine (NORVASC) 10 MG tablet Take 1 tablet (10 mg total) by mouth daily. 90 tablet 3   atorvastatin (LIPITOR) 40 MG tablet TAKE 1 TABLET BY MOUTH EVERY DAY 90 tablet 3   blood glucose meter kit and supplies Dispense based on patient and insurance preference. Use up to four times daily as directed. E11.9 1 each 0   Blood Glucose Monitoring Suppl (ONE TOUCH ULTRA 2) w/Device KIT Use as directed daily 1 each 0   cephALEXin (KEFLEX) 500 MG capsule Take 1 capsule (500 mg total) by mouth 3 (three) times daily. 30 capsule 0   citalopram (CELEXA) 10 MG tablet TAKE 1 TABLET BY MOUTH EVERY DAY 90 tablet 2   [facility] VITAMIN B12 1000 MCG  tablet TAKE 1 TABLET BY MOUTH EVERY DAY 90 tablet 3   furosemide (LASIX) 40 MG tablet TAKE 1 TABLET BY MOUTH EVERY DAY 90 tablet 3   iron polysaccharides (NU-IRON) 150 MG capsule Take 1 capsule (150 mg total) by mouth 2 (two) times daily. 180 capsule 1   KLOR-CON M20 20 MEQ tablet TAKE 1 TABLET BY MOUTH EVERY DAY 90 tablet 3   Lancets (ONETOUCH DELICA PLUS 123XX123) MISC  USE AS DIRECTED TEST 1-2 TIMES A DAY 100 each 23   levothyroxine (SYNTHROID) 25 MCG tablet TAKE 1 TABLET BY MOUTH EVERY DAY 90 tablet 2   losartan (COZAAR) 100 MG tablet Take 1 tablet (100 mg total) by mouth daily. 90 tablet 3   metFORMIN (GLUCOPHAGE) 1000 MG tablet TAKE 1 TAB BY MOUTH IN THE AM,AND 1/2 TAB IN THE PM (Patient taking differently: Take 500 mg by mouth 2 (two) times daily with a meal. TAKE 1 TAB BY MOUTH IN THE AM,AND 1/2 TAB IN THE PM) 135 tablet 1   metoprolol succinate (TOPROL-XL) 25 MG 24 hr tablet Take 0.5 tablets (12.5 mg total) by mouth daily. 45 tablet 3   mirtazapine (REMERON) 15 MG tablet TAKE 1 TABLET BY MOUTH EVERYDAY AT BEDTIME 90 tablet 3   ONETOUCH ULTRA test strip USE AS INSTRUCTED TEST 1-2 TIMES A DAY 100 strip 23   pantoprazole (PROTONIX) 40 MG tablet TAKE 1 TABLET BY MOUTH TWICE A DAY 180 tablet 2   No current facility-administered medications on file prior to visit.        ROS:  All others reviewed and negative.  Objective        PE:  BP (!) 154/78 (BP Location: Right Arm, Patient Position: Sitting, Cuff Size: Large)   Pulse 87   Temp 97.7 F (36.5 C) (Oral)   Ht 5' 1"$  (1.549 m)   Wt 192 lb (87.1 kg)   SpO2 94%   BMI 36.28 kg/m                 Constitutional: Pt appears in NAD               HENT: Head: NCAT.                Right Ear: External ear normal.                 Left Ear: External ear normal.                Eyes: . Pupils are equal, round, and reactive to light. Conjunctivae and EOM are normal               Nose: without d/c or deformity               Neck: Neck supple.  Gross normal ROM               Cardiovascular: Normal rate and regular rhythm.                 Pulmonary/Chest: Effort normal and breath sounds without rales or wheezing.                Abd:  Soft, NT, ND, + BS, no organomegaly               Neurological: Pt is alert. At baseline orientation, motor grossly intact               Skin: Skin is warm. No rashes, no other new lesions, LE edema - trace bilateral               Psychiatric: Pt behavior is normal without agitation   Micro: none  Cardiac tracings I have personally interpreted today:  none  Pertinent Radiological findings (summarize): none   Lab Results  Component Value Date   WBC 4.8 03/15/2022   HGB 11.6 (L) 03/15/2022   HCT 34.4 (L) 03/15/2022   PLT 184.0 03/15/2022  GLUCOSE 202 (H) 03/15/2022   CHOL 97 03/15/2022   TRIG 81.0 03/15/2022   HDL 35.40 (L) 03/15/2022   LDLCALC 46 03/15/2022   ALT 17 03/15/2022   AST 20 03/15/2022   NA 139 03/15/2022   K 4.8 03/15/2022   CL 103 03/15/2022   CREATININE 2.30 (H) 03/15/2022   BUN 35 (H) 03/15/2022   CO2 29 03/15/2022   TSH 4.73 03/15/2022   INR 1.7 (H) 11/01/2020   HGBA1C 6.5 03/15/2022   MICROALBUR 1.3 03/15/2022   Assessment/Plan:  Mary Mccarthy is a 82 y.o. White or Caucasian [1] female with  has a past medical history of ALLERGIC RHINITIS (06/24/2007), Anemia, AVM (arteriovenous malformation) of colon, Blood transfusion without reported diagnosis (05/2015), COLONIC POLYPS, HX OF (06/24/2007), Coronary artery disease, non-occlusive (05/2015), DEPRESSION (10/08/2006), DIABETES MELLITUS, TYPE II (10/05/2006), GERD (06/24/2007), History of CVA (cerebrovascular accident) (08/10/2020), HYPERLIPIDEMIA (10/08/2006), HYPERTENSION (10/08/2006), INSOMNIA (09/24/2008), Left knee DJD, Nonsustained paroxysmal ventricular tachycardia ([place]) (10/2018), Osteoporosis (03/10/2016), PAF (paroxysmal atrial fibrillation) ([place]), PAF with RVR: CHA2DS2-VASc 9.  On Eliquis and amiodarone.  (08/11/2020), PEPTIC ULCER DISEASE (10/08/2006), S/P TAVR (transcatheter aortic valve replacement) (10/26/2020), and Severe aortic stenosis (08/2020).  Encounter for well adult exam with abnormal findings Age and sex appropriate education and counseling updated with regular exercise and diet Referrals for preventative services - pt to call for eye appt soon Immunizations addressed - declines flu shot, for shingrx at pharmacy Smoking counseling  - none needed Evidence for depression or other mood disorder - none significant Most recent labs reviewed. I have personally reviewed and have noted: 1) the patient's medical and social history 2) The patient's current medications and supplements 3) The patient's height, weight, and BMI have been recorded in the chart   Urinary frequency I suspect OAB - for vesicare 5 mg qd  UTI (urinary tract infection) Little to no symptoms, pt to finish current antibx  Essential hypertension BP Readings from Last 3 Encounters:  03/27/22 (!) 154/78  01/02/22 (!) 170/62  11/09/21 124/64   Uncontrolled, pt to continue medical treatment norvasc 10 qd, toprol xl 12.5 qd, losartan 100 qd - declines change for today, to f/u cardiology per pt preference   Vitamin D deficiency Last vitamin D Lab Results  Component Value Date   VD25OH 14.42 (L) 03/15/2022   Low, to start oral replacement  Followup: Return in about 6 months (around 09/25/2022).  Cathlean Cower, MD 03/29/2022 8:57 PM [facility]

## 2022-03-29 ENCOUNTER — Encounter: Payer: Self-pay | Admitting: Internal Medicine

## 2022-03-29 DIAGNOSIS — N390 Urinary tract infection, site not specified: Secondary | ICD-10-CM | POA: Insufficient documentation

## 2022-03-29 DIAGNOSIS — R350 Frequency of micturition: Secondary | ICD-10-CM | POA: Insufficient documentation

## 2022-03-29 NOTE — Assessment & Plan Note (Signed)
Age and sex appropriate education and counseling updated with regular exercise and diet Referrals for preventative services - pt to call for eye appt soon Immunizations addressed - declines flu shot, for shingrx at pharmacy Smoking counseling  - none needed Evidence for depression or other mood disorder - none significant Most recent labs reviewed. I have personally reviewed and have noted: 1) the patient's medical and social history 2) The patient's current medications and supplements 3) The patient's height, weight, and BMI have been recorded in the chart

## 2022-03-29 NOTE — Assessment & Plan Note (Signed)
Little to no symptoms, pt to finish current antibx

## 2022-03-29 NOTE — Assessment & Plan Note (Signed)
BP Readings from Last 3 Encounters:  03/27/22 (!) 154/78  01/02/22 (!) 170/62  11/09/21 124/64   Uncontrolled, pt to continue medical treatment norvasc 10 qd, toprol xl 12.5 qd, losartan 100 qd - declines change for today, to f/u cardiology per pt preference

## 2022-03-29 NOTE — Assessment & Plan Note (Signed)
I suspect OAB - for vesicare 5 mg qd

## 2022-03-29 NOTE — Assessment & Plan Note (Signed)
Last vitamin D Lab Results  Component Value Date   VD25OH 14.42 (L) 03/15/2022   Low, to start oral replacement

## 2022-04-04 ENCOUNTER — Other Ambulatory Visit: Payer: Self-pay | Admitting: Internal Medicine

## 2022-04-25 ENCOUNTER — Other Ambulatory Visit: Payer: Self-pay | Admitting: Internal Medicine

## 2022-05-03 ENCOUNTER — Telehealth: Payer: Self-pay

## 2022-05-03 NOTE — Telephone Encounter (Signed)
Contacted Mary Mccarthy to schedule their annual wellness visit. Appointment made for 05/11/22.  Norton Blizzard, [place] ([facility])  [facility] [phone]

## 2022-05-08 ENCOUNTER — Other Ambulatory Visit: Payer: Self-pay | Admitting: Internal Medicine

## 2022-05-08 ENCOUNTER — Other Ambulatory Visit: Payer: Self-pay | Admitting: Cardiology

## 2022-05-11 ENCOUNTER — Ambulatory Visit (INDEPENDENT_AMBULATORY_CARE_PROVIDER_SITE_OTHER): Payer: 59

## 2022-05-11 VITALS — Ht 61.0 in | Wt 198.0 lb

## 2022-05-11 DIAGNOSIS — Z0000 Encounter for general adult medical examination without abnormal findings: Secondary | ICD-10-CM | POA: Diagnosis not present

## 2022-05-11 NOTE — Progress Notes (Addendum)
I connected with  [name] on 05/11/2022 by a audio enabled telemedicine application and verified that I am speaking with the correct person using two identifiers.  Patient Location: Home  Provider Location: Office/Clinic  I discussed the limitations of evaluation and management by telemedicine. The patient expressed understanding and agreed to proceed.  Subjective:   Mary Mccarthy is a 82 y.o. female who presents for Medicare Annual (Subsequent) preventive examination.  Review of Systems     Cardiac Risk Factors include: advanced age (>49men, >37 women);dyslipidemia;family history of premature cardiovascular disease;hypertension;sedentary lifestyle     Objective:    Today's Vitals   05/11/22 1602  Weight: 198 lb (89.8 kg)  Height: 5\' 1"  (1.549 m)  PainSc: 0-No pain   Body mass index is 37.41 kg/m.     05/11/2022    4:05 PM 05/09/2021    1:09 PM 11/01/2020    7:05 PM 10/24/2020    9:30 PM 10/18/2020    5:38 PM 09/07/2016   10:18 AM 05/17/2015    8:52 PM  Advanced Directives  Does Patient Have a Medical Advance Directive? Yes No No No No No No  Type of Estate agent of [place];Living will        Copy of Healthcare Power of Attorney in Chart? No - copy requested        Would patient like information on creating a medical advance directive?  No - Patient declined No - Patient declined No - Patient declined  Yes (ED - Information included in AVS) No - patient declined information    Current Medications (verified) Outpatient Encounter Medications as of 05/11/2022  Medication Sig   acetaminophen (TYLENOL) 500 MG tablet Take 500 mg by mouth every 6 (six) hours as needed for moderate pain.   ALPRAZolam (XANAX) 0.25 MG tablet TAKE 1 TABLET BY MOUTH THREE TIMES A DAY AS NEEDED FOR ANXIETY   amiodarone (PACERONE) 200 MG tablet TAKE 1 TABLET BY MOUTH EVERY DAY   amLODipine (NORVASC) 10 MG tablet Take 1 tablet (10 mg total) by mouth daily.   atorvastatin  (LIPITOR) 40 MG tablet TAKE 1 TABLET BY MOUTH EVERY DAY   blood glucose meter kit and supplies Dispense based on patient and insurance preference. Use up to four times daily as directed. E11.9   Blood Glucose Monitoring Suppl (ONE TOUCH ULTRA 2) w/Device KIT Use as directed daily   cephALEXin (KEFLEX) 500 MG capsule Take 1 capsule (500 mg total) by mouth 3 (three) times daily.   citalopram (CELEXA) 10 MG tablet TAKE 1 TABLET BY MOUTH EVERY DAY   [employer] VITAMIN B12 1000 MCG tablet TAKE 1 TABLET BY MOUTH EVERY DAY   furosemide (LASIX) 40 MG tablet TAKE 1 TABLET BY MOUTH EVERY DAY   iron polysaccharides (NU-IRON) 150 MG capsule Take 1 capsule (150 mg total) by mouth 2 (two) times daily.   KLOR-CON M20 20 MEQ tablet TAKE 1 TABLET BY MOUTH EVERY DAY   Lancets (ONETOUCH DELICA PLUS LANCET33G) MISC USE AS DIRECTED TEST 1-2 TIMES A DAY   levothyroxine (SYNTHROID) 25 MCG tablet TAKE 1 TABLET BY MOUTH EVERY DAY   losartan (COZAAR) 100 MG tablet TAKE 1 TABLET BY MOUTH EVERY DAY   losartan (COZAAR) 50 MG tablet TAKE 1 TABLET BY MOUTH EVERY DAY   metFORMIN (GLUCOPHAGE) 1000 MG tablet TAKE 1 TAB BY MOUTH IN THE AM,AND 1/2 TAB IN THE PM (Patient taking differently: Take 500 mg by mouth 2 (two) times daily with a meal. TAKE  1 TAB BY MOUTH IN THE AM,AND 1/2 TAB IN THE PM)   metoprolol succinate (TOPROL-XL) 25 MG 24 hr tablet TAKE 1/2 TABLET BY MOUTH EVERY DAY   mirtazapine (REMERON) 15 MG tablet TAKE 1 TABLET BY MOUTH EVERYDAY AT BEDTIME   ONETOUCH ULTRA test strip USE AS INSTRUCTED TEST 1-2 TIMES A DAY   pantoprazole (PROTONIX) 40 MG tablet TAKE 1 TABLET BY MOUTH TWICE A DAY   solifenacin (VESICARE) 5 MG tablet Take 1 tablet (5 mg total) by mouth daily.   No facility-administered encounter medications on file as of 05/11/2022.    Allergies (verified) Ibuprofen   History: Past Medical History:  Diagnosis Date   ALLERGIC RHINITIS 06/24/2007   Anemia    AVM (arteriovenous malformation) of colon    Blood  transfusion without reported diagnosis 05/2015   COLONIC POLYPS, HX OF 06/24/2007   Coronary artery disease, non-occlusive 05/2015   Trop + w/ Acute Anemia =>CATH: small RI - Ostial 60%, ostial RCA 30% and dLAD 40-50%;; 9/'21: Cor Ca Score 624. Mild (25-49%) prox RCA & LAD,; Moderate (50-69%) Ostial Small RI & prox LCx.     DEPRESSION 10/08/2006   DIABETES MELLITUS, TYPE II 10/05/2006   GERD 06/24/2007   History of CVA (cerebrovascular accident) 08/10/2020   08/2020- found to be in afib with RVR, started on Eliquis (shower of emboli from A. fib as complication of severe AS)   HYPERLIPIDEMIA 10/08/2006   HYPERTENSION 10/08/2006   INSOMNIA 09/24/2008   Left knee DJD    Nonsustained paroxysmal ventricular tachycardia 10/2018   Post-TAVR Zio Patch: Frequent (206) runs of NSVT - Fastest 5 beats 239 bpm. Longest 9.3 Sec avg 135 bpm. (? possibly Afib w/ aberrancy)   Osteoporosis 03/10/2016   PAF (paroxysmal atrial fibrillation)    PAF with RVR: CHA2DS2-VASc 9.  On Eliquis and amiodarone. 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 Angelena Form & Dr. Cyndia Bent   Severe aortic stenosis 08/2020   Progression from Mod-Severe AS to Severe AS - noted on Echo 08/2020 -> (mean gradient progressed from 34 to 42 mmHg) this was in setting of new onset A. fib RVR, acute diastolic HF and shower of emboli CVA. ->  Referred for TAVR, completed 10/26/2020   Past Surgical History:  Procedure Laterality Date   APPENDECTOMY     BIOPSY  10/21/2020   Procedure: BIOPSY;  Surgeon: Sharyn Creamer, MD;  Location: [facility] ENDOSCOPY;  Service: Gastroenterology;;   BREAST BIOPSY     CARDIAC CATHETERIZATION N/A 05/21/2015   Procedure: Left Heart Cath and Coronary Angiography;  Surgeon: Leonie Man, MD;  Location: [place] CV LAB;  Service: Cardiovascular;: Ost RI 60%, Ost RCA 30%, dLAD tapers  to small vessel w/ 40-50%. Mildly elevated LVEDP. Normal LV Fxn.   COLONOSCOPY N/A 05/20/2015   Procedure: COLONOSCOPY;  Surgeon: Manus Gunning, MD;  Location: [place];  Service: Gastroenterology;  Laterality: N/A;   CORONARY CA2+ SCORE / CARDIAC CT ANGIOGRAM  10/09/2019   Calcium score 624.  82nd percentile. Dominant RCA: Mild (25-49%) proximal stenosis-distal bifurcation into PDA and PAV--< RPL branches.  LAD (1 major mid vessel diagonal) diffuse calcified plaque, mild proximal stenosis with minimal distal stenosis.  Small RI moderate ostial disease.  LCx-moderate mixed (50-69%) proximal stenosis.  Small dOM1 disease.  Trileaflet AoV, annular Ca2+ - probable AS  ESOPHAGOGASTRODUODENOSCOPY N/A 05/20/2015   Procedure: ESOPHAGOGASTRODUODENOSCOPY (EGD);  Surgeon: Manus Gunning, MD;  Location: [place];  Service: Gastroenterology;  Laterality: N/A;   ESOPHAGOGASTRODUODENOSCOPY (EGD) WITH PROPOFOL N/A 10/21/2020   Procedure: ESOPHAGOGASTRODUODENOSCOPY (EGD) WITH PROPOFOL;  Surgeon: Sharyn Creamer, MD;  Location: [place];  Service: Gastroenterology;  Laterality: N/A;   INTRAOPERATIVE TRANSTHORACIC ECHOCARDIOGRAM N/A 10/26/2020   Procedure: INTRAOPERATIVE TRANSTHORACIC ECHOCARDIOGRAM;  Surgeon: Angelena Form; Location: [facility] OR; Pre-TAVR well-visualized calcified AoV mean Grad 37 mmHg, AVA 0.72 cm => Post TAVR well-positioned supra-annular 26 mm Medtronic Evolut Pro valve placed with no PVL.  Mean gradient 14 mmHg.  AVR 1.58 cm.  Normal flow to RCA and LM-LAD.  ; EF 60 to 65%.  Degenerative severe MAC w/ mild MR.   RIGHT/LEFT HEART CATH AND CORONARY ANGIOGRAPHY N/A 09/29/2020   Procedure: RIGHT/LEFT HEART CATH AND CORONARY ANGIOGRAPHY;  Surgeon: Burnell Blanks, MD;  Location: [place] [facility];  Service: Cardiovascular; pre-TAVR:  pRCA 20%, m-dRCA 30%. D LM-pLAD 20%. RI 30%, mLAD 20%.  Severe AS with mean gradient measured at 52.8 mmHg.   TONSILLECTOMY     TRANSCATHETER  AORTIC VALVE REPLACEMENT, TRANSFEMORAL N/A 10/26/2020   Procedure: TRANSCATHETER AORTIC VALVE REPLACEMENT, TRANSFEMORAL;  Surgeon: Burnell Blanks, MD;  Location: [facility] OR;  Service: Open Heart Surgery;; Medtronic Evolut-Pro + (size 26 mm, model # EVPROPLUS -26US, serial # [id number]); transient high-grade AV block noted so PPM left in place.   TRANSTHORACIC ECHOCARDIOGRAM  03/17/2019   a) 03/2019: EF 60 to 65%.  Moderate LVH.  GRII DD.  Mod-Severe AS (m grad 36 mmHg, peak 59 mmHg); b) 09/2019: EF 65-70%, No RWMA. Gr1 DD/hi LAP, Mild hi PAP. Mod LA Dil. MOD AS (mean Grad 34.5 mmHg).  STABLE   TRANSTHORACIC ECHOCARDIOGRAM  08/27/2020   Admitted for CVA/Afib RVR & CHF (Progression to SEVERE SYMPTOMATIC AS):  Severe Calcific Aortic Stenosis (VTI AVA estimated 0.86 cm, mean gradient 42 mmHg, V-max 4.36 m/s).  EF 55 to 60%.  Severe concentric LVH.  Unable to determine diastolic parameters.  Moderately elevated PAP.  Mild LA dilation.  Mild circumferential pericardial effusion.  Trivial MR.  Severe MAC.   TRANSTHORACIC ECHOCARDIOGRAM  11/25/2020   1 Month s/p TAVR: EF 55 to 60%.  No R WMA.  GR 1 DD.  Elevated LVEDP.  Normal RV size with mildly elevated RVP estimated 44 mmHg.  Oscillating density in the RV suspect calcified chordal apparatus versus calcified thrombus.  Moderate LA dilation.  Normal IVC/RA P => Well-positioned 26 mm Medtronic Evolut Pro THVwith mean AOV gradient 11 mmHg.  Trivial PVL   TRANSTHORACIC ECHOCARDIOGRAM  11/02/2020   a) Day 1 Post-op TAVR 10/27/20:  Well-Positioned Supra Annular Medtronic Evolut Pro THP.  Mean gradient 12 mmHg, peak 24 mmHg.  AVA 1.8 cm.  Trivial PVL.  EF 60 to 65%.  Normal LV function.  Mild LVH.  Severe LA dilation.  Mild RA dilation.  Severe MAC;; b) 11/02/20: Normal structure and function of the aortic valve prosthesis.  Mean gradient 9 mmHg (otherwise stable)   TUBAL LIGATION     Family History  Problem Relation Age of Onset   Alcohol abuse Other    Arthritis  Other        DJD   Hyperlipidemia Other    Heart disease Other    Stroke Other    Hypertension Other    Depression Other    Diabetes Other    Cancer Mother  ENT cancer   CAD Sister        Several MI & PPM; long term smoker, EtOH   Heart attack Sister 75       several   Arrhythmia Sister        s/p PPM (Dr. Sallyanne Kuster)   Social History   Socioeconomic History   Marital status: Widowed    Spouse name: Not on file   Number of children: 1   Years of education: Not on file   Highest education level: Not on file  Occupational History   Occupation: retired - Social research officer, government: RETIRED  Tobacco Use   Smoking status: Never   Smokeless tobacco: Never  Vaping Use   Vaping Use: Never used  Substance and Sexual Activity   Alcohol use: No    Alcohol/week: 0.0 standard drinks of alcohol   Drug use: No   Sexual activity: Not on file  Other Topics Concern   Not on file  Social History Narrative   Recently widowed--husband died in 11-27-18.      Now lives alone but her son usually stays with her at night.  During the day she has 2 nephews who live nearby to come in and check on her intermittently.  Also one of her nieces calls routinely.      When her son is home and awake, she may try to walk on a treadmill, but is scared to walk outside.   Social Determinants of Health   Financial Resource Strain: Low Risk  (05/11/2022)   Overall Financial Resource Strain (CARDIA)    Difficulty of Paying Living Expenses: Not hard at all  Food Insecurity: No Food Insecurity (05/11/2022)   Hunger Vital Sign    Worried About Running Out of Food in the Last Year: Never true    Ran Out of Food in the Last Year: Never true  Transportation Needs: No Transportation Needs (05/11/2022)   PRAPARE - Hydrologist (Medical): No    Lack of Transportation (Non-Medical): No  Physical Activity: Inactive (05/11/2022)   Exercise Vital Sign    Days of Exercise per  Week: 0 days    Minutes of Exercise per Session: 0 min  Stress: No Stress Concern Present (05/11/2022)   [place]    Feeling of Stress : Not at all  Social Connections: Socially Isolated (05/11/2022)   Social Connection and Isolation Panel [NHANES]    Frequency of Communication with Friends and Family: More than three times a week    Frequency of Social Gatherings with Friends and Family: Twice a week    Attends Religious Services: Never    Marine scientist or Organizations: No    Attends Archivist Meetings: Never    Marital Status: Widowed    Tobacco Counseling Counseling given: Not Answered   Clinical Intake:  Pre-visit preparation completed: Yes  Pain : No/denies pain Pain Score: 0-No pain     BMI - recorded: 37.41 Nutritional Status: BMI > 30  Obese Nutritional Risks: None Diabetes: No  How often do you need to have someone help you when you read instructions, pamphlets, or other written materials from your doctor or pharmacy?: 1 - Never What is the last grade level you completed in school?: HSG  Diabetic? No  Interpreter Needed?: No  Information entered by :: Lisette Abu, LPN.   Activities of Daily Living    05/11/2022  4:32 PM  In your present state of health, do you have any difficulty performing the following activities:  Hearing? 1  Vision? 0  Difficulty concentrating or making decisions? 1  Walking or climbing stairs? 1  Dressing or bathing? 0  Doing errands, shopping? 1  Preparing Food and eating ? N  Using the Toilet? N  In the past six months, have you accidently leaked urine? Y  Do you have problems with loss of bowel control? Y  Managing your Medications? N  Managing your Finances? N  Housekeeping or managing your Housekeeping? N    Patient Care Team: Corwin Levins, MD as PCP - General (Internal Medicine) [name], MD as PCP - Cardiology  (Cardiology) Kathyrn Sheriff, [facility] as Pharmacist (Pharmacist) Antony Contras, MD as Consulting Physician (Ophthalmology)  Indicate any recent Medical Services you may have received from other than [facility] providers in the past year (date may be approximate).     Assessment:   This is a routine wellness examination for [place].  Hearing/Vision screen Hearing Screening - Comments:: Patient has some decreased hearing.  No hearing aids. Vision Screening - Comments:: Wears rx glasses - patient not up to date with routine eye exams with Antony Contras, MD.   Dietary issues and exercise activities discussed: Current Exercise Habits: The patient does not participate in regular exercise at present, Exercise limited by: cardiac condition(s)   Goals Addressed             This Visit's Progress    Patient Stated       To take one day at a time and not to be lonely.      Depression Screen    05/11/2022    4:18 PM 03/27/2022    1:14 PM 09/23/2021    1:04 PM 05/09/2021    1:12 PM 04/20/2021    1:00 PM 02/16/2021    2:25 PM 12/01/2020    1:21 PM  PHQ 2/9 Scores  PHQ - 2 Score 2 2 0 0 0 1 1  PHQ- 9 Score 10 11 0  0      Fall Risk    05/15/2022   11:22 AM 05/11/2022   11:22 AM 03/27/2022    1:15 PM 09/23/2021    1:05 PM 06/07/2021    2:50 PM  Fall Risk   Falls in the past year? 0 0 0 0 0  Number falls in past yr: 0 0 0 0 0  Injury with Fall? 0 0 0 0 0  Comment     N/A- no falls reported x 12 months; continues to use cane and walker  Risk for fall due to : No Fall Risks No Fall Risks No Fall Risks No Fall Risks Medication side effect;Impaired vision  Follow up Falls prevention discussed Falls prevention discussed Falls evaluation completed Falls evaluation completed Falls prevention discussed    FALL RISK PREVENTION PERTAINING TO THE HOME:  Any stairs in or around the home? No  If so, are there any without handrails? No  Home free of loose throw rugs in walkways, pet beds, electrical  cords, etc? Yes  Adequate lighting in your home to reduce risk of falls? Yes   ASSISTIVE DEVICES UTILIZED TO PREVENT FALLS:  Life alert? No  Use of a cane, walker or w/c? Yes  Grab bars in the bathroom? Yes  Shower chair or bench in shower? Yes  Elevated toilet seat or a handicapped toilet? No   TIMED UP AND GO:  Was the test performed? No . Phone Visit  Cognitive Function:    09/07/2016   11:01 AM  MMSE - Mini Mental State Exam  Orientation to time 5  Orientation to Place 5  Registration 3  Attention/ Calculation 5  Recall 2  Language- name 2 objects 2  Language- repeat 1  Language- follow 3 step command 3  Language- read & follow direction 1  Write a sentence 1  Copy design 1  Total score 29        05/11/2022    4:33 PM 05/09/2021    1:24 PM  6CIT Screen  What Year? 0 points 0 points  What month? 0 points 0 points  What time? 0 points 0 points  Count back from 20 0 points 0 points  Months in reverse 0 points 0 points  Repeat phrase 0 points 2 points  Total Score 0 points 2 points    Immunizations Immunization History  Administered Date(s) Administered   Fluad Quad(high Dose 65+) 12/26/2018, 12/06/2019, 10/13/2020   Influenza Split 12/21/2010, 12/28/2011   Influenza Whole 12/01/2005, 03/26/2009, 12/07/2009   Influenza, High Dose Seasonal PF 11/19/2012, 11/18/2013, 02/18/2015, 12/28/2015, 12/07/2016, 10/17/2017   PFIZER(Purple Top)SARS-COV-2 Vaccination 05/01/2019, 05/26/2019, 05/12/2020   Pneumococcal Conjugate-13 12/03/2012   Pneumococcal Polysaccharide-23 11/06/2005   Td 09/24/2008   Tdap 09/13/2018, 09/13/2018    TDAP status: Up to date  Flu Vaccine status: Declined, Education has been provided regarding the importance of this vaccine but patient still declined. Advised may receive this vaccine at local pharmacy or Health Dept. Aware to provide a copy of the vaccination record if obtained from local pharmacy or Health Dept. Verbalized acceptance and  understanding.  Pneumococcal vaccine status: Up to date  Covid-19 vaccine status: Completed vaccines  Qualifies for Shingles Vaccine? Yes   Zostavax completed No   Shingrix Completed?: No.    Education has been provided regarding the importance of this vaccine. Patient has been advised to call insurance company to determine out of pocket expense if they have not yet received this vaccine. Advised may also receive vaccine at local pharmacy or Health Dept. Verbalized acceptance and understanding.  Screening Tests Health Maintenance  Topic Date Due   OPHTHALMOLOGY EXAM  07/16/2019   Zoster Vaccines- Shingrix (1 of 2) 06/25/2022 (Originally 10/04/1959)   INFLUENZA VACCINE  09/07/2022   HEMOGLOBIN A1C  09/13/2022   FOOT EXAM  09/24/2022   Diabetic kidney evaluation - eGFR measurement  03/16/2023   Diabetic kidney evaluation - Urine ACR  03/16/2023   Medicare Annual Wellness (AWV)  05/11/2023   DTaP/Tdap/Td (4 - Td or Tdap) 09/12/2028   Pneumonia Vaccine 58+ Years old  Completed   DEXA SCAN  Completed   HPV VACCINES  Aged Out   COVID-19 Vaccine  Discontinued    Health Maintenance  Health Maintenance Due  Topic Date Due   OPHTHALMOLOGY EXAM  07/16/2019    Colorectal cancer screening: No longer required.   Mammogram status: No longer required due to age.  Bone Density status: No longer recommended due to age.  Lung Cancer Screening: (Low Dose CT Chest recommended if Age 50-80 years, 30 pack-year currently smoking OR have quit w/in 15years.) does not qualify.   Lung Cancer Screening Referral: no  Additional Screening:  Hepatitis C Screening: does not qualify; Completed no  Vision Screening: Recommended annual ophthalmology exams for early detection of glaucoma and other disorders of the eye. Is the patient up to date with their annual eye exam?  No  Who is the provider or what is the name of the office in which the patient attends annual eye exams? Antony Contras, MD. If pt is  not established with a provider, would they like to be referred to a provider to establish care? No .   Dental Screening: Recommended annual dental exams for proper oral hygiene  Community Resource Referral / Chronic Care Management: CRR required this visit?  No   CCM required this visit?  No      Plan:     I have personally reviewed and noted the following in the patient's chart:   Medical and social history Use of alcohol, tobacco or illicit drugs  Current medications and supplements including opioid prescriptions. Patient is not currently taking opioid prescriptions. Functional ability and status Nutritional status Physical activity Advanced directives List of other physicians Hospitalizations, surgeries, and ER visits in previous 12 months Vitals Screenings to include cognitive, depression, and falls Referrals and appointments  In addition, I have reviewed and discussed with patient certain preventive protocols, quality metrics, and best practice recommendations. A written personalized care plan for preventive services as well as general preventive health recommendations were provided to patient.     Mickeal Needy, LPN   6/0/4540   Nurse Notes: None

## 2022-05-11 NOTE — Patient Instructions (Signed)
Mary Mccarthy , Thank you for taking time to come for your Medicare Wellness Visit. I appreciate your ongoing commitment to your health goals. Please review the following plan we discussed and let me know if I can assist you in the future.   These are the goals we discussed:  Goals      Patient Stated     To take one day at a time and not to be lonely.        This is a list of the screening recommended for you and due dates:  Health Maintenance  Topic Date Due   Eye exam for diabetics  07/16/2019   Zoster (Shingles) Vaccine (1 of 2) 06/25/2022*   Flu Shot  09/07/2022   Hemoglobin A1C  09/13/2022   Complete foot exam   09/24/2022   Yearly kidney function blood test for diabetes  03/16/2023   Yearly kidney health urinalysis for diabetes  03/16/2023   Medicare Annual Wellness Visit  05/11/2023   DTaP/Tdap/Td vaccine (4 - Td or Tdap) 09/12/2028   Pneumonia Vaccine  Completed   DEXA scan (bone density measurement)  Completed   HPV Vaccine  Aged Out   COVID-19 Vaccine  Discontinued  *Topic was postponed. The date shown is not the original due date.    Advanced directives: Yes  Conditions/risks identified: Yes  Next appointment: Follow up in one year for your annual wellness visit.   Preventive Care 33 Years and Older, Female Preventive care refers to lifestyle choices and visits with your health care provider that can promote health and wellness. What does preventive care include? A yearly physical exam. This is also called an annual well check. Dental exams once or twice a year. Routine eye exams. Ask your health care provider how often you should have your eyes checked. Personal lifestyle choices, including: Daily care of your teeth and gums. Regular physical activity. Eating a healthy diet. Avoiding tobacco and drug use. Limiting alcohol use. Practicing safe sex. Taking low-dose aspirin every day. Taking vitamin and mineral supplements as recommended by your health care  provider. What happens during an annual well check? The services and screenings done by your health care provider during your annual well check will depend on your age, overall health, lifestyle risk factors, and family history of disease. Counseling  Your health care provider may ask you questions about your: Alcohol use. Tobacco use. Drug use. Emotional well-being. Home and relationship well-being. Sexual activity. Eating habits. History of falls. Memory and ability to understand (cognition). Work and work Statistician. Reproductive health. Screening  You may have the following tests or measurements: Height, weight, and BMI. Blood pressure. Lipid and cholesterol levels. These may be checked every 5 years, or more frequently if you are over 50 years old. Skin check. Lung cancer screening. You may have this screening every year starting at age 21 if you have a 30-pack-year history of smoking and currently smoke or have quit within the past 15 years. Fecal occult blood test (FOBT) of the stool. You may have this test every year starting at age 48. Flexible sigmoidoscopy or colonoscopy. You may have a sigmoidoscopy every 5 years or a colonoscopy every 10 years starting at age 61. Hepatitis C blood test. Hepatitis B blood test. Sexually transmitted disease (STD) testing. Diabetes screening. This is done by checking your blood sugar (glucose) after you have not eaten for a while (fasting). You may have this done every 1-3 years. Bone density scan. This is done to screen  for osteoporosis. You may have this done starting at age 49. Mammogram. This may be done every 1-2 years. Talk to your health care provider about how often you should have regular mammograms. Talk with your health care provider about your test results, treatment options, and if necessary, the need for more tests. Vaccines  Your health care provider may recommend certain vaccines, such as: Influenza vaccine. This is  recommended every year. Tetanus, diphtheria, and acellular pertussis (Tdap, Td) vaccine. You may need a Td booster every 10 years. Zoster vaccine. You may need this after age 33. Pneumococcal 13-valent conjugate (PCV13) vaccine. One dose is recommended after age 46. Pneumococcal polysaccharide (PPSV23) vaccine. One dose is recommended after age 75. Talk to your health care provider about which screenings and vaccines you need and how often you need them. This information is not intended to replace advice given to you by your health care provider. Make sure you discuss any questions you have with your health care provider. Document Released: 02/19/2015 Document Revised: 10/13/2015 Document Reviewed: 11/24/2014 Elsevier Interactive Patient Education  2017 [place] Prevention in the Home Falls can cause injuries. They can happen to people of all ages. There are many things you can do to make your home safe and to help prevent falls. What can I do on the outside of my home? Regularly fix the edges of walkways and driveways and fix any cracks. Remove anything that might make you trip as you walk through a door, such as a raised step or threshold. Trim any bushes or trees on the path to your home. Use bright outdoor lighting. Clear any walking paths of anything that might make someone trip, such as rocks or tools. Regularly check to see if handrails are loose or broken. Make sure that both sides of any steps have handrails. Any raised decks and porches should have guardrails on the edges. Have any leaves, snow, or ice cleared regularly. Use sand or salt on walking paths during winter. Clean up any spills in your garage right away. This includes oil or grease spills. What can I do in the bathroom? Use night lights. Install grab bars by the toilet and in the tub and shower. Do not use towel bars as grab bars. Use non-skid mats or decals in the tub or shower. If you need to sit down in  the shower, use a plastic, non-slip stool. Keep the floor dry. Clean up any water that spills on the floor as soon as it happens. Remove soap buildup in the tub or shower regularly. Attach bath mats securely with double-sided non-slip rug tape. Do not have throw rugs and other things on the floor that can make you trip. What can I do in the bedroom? Use night lights. Make sure that you have a light by your bed that is easy to reach. Do not use any sheets or blankets that are too big for your bed. They should not hang down onto the floor. Have a firm chair that has side arms. You can use this for support while you get dressed. Do not have throw rugs and other things on the floor that can make you trip. What can I do in the kitchen? Clean up any spills right away. Avoid walking on wet floors. Keep items that you use a lot in easy-to-reach places. If you need to reach something above you, use a strong step stool that has a grab bar. Keep electrical cords out of the way. Do  not use floor polish or wax that makes floors slippery. If you must use wax, use non-skid floor wax. Do not have throw rugs and other things on the floor that can make you trip. What can I do with my stairs? Do not leave any items on the stairs. Make sure that there are handrails on both sides of the stairs and use them. Fix handrails that are broken or loose. Make sure that handrails are as long as the stairways. Check any carpeting to make sure that it is firmly attached to the stairs. Fix any carpet that is loose or worn. Avoid having throw rugs at the top or bottom of the stairs. If you do have throw rugs, attach them to the floor with carpet tape. Make sure that you have a light switch at the top of the stairs and the bottom of the stairs. If you do not have them, ask someone to add them for you. What else can I do to help prevent falls? Wear shoes that: Do not have high heels. Have rubber bottoms. Are comfortable  and fit you well. Are closed at the toe. Do not wear sandals. If you use a stepladder: Make sure that it is fully opened. Do not climb a closed stepladder. Make sure that both sides of the stepladder are locked into place. Ask someone to hold it for you, if possible. Clearly mark and make sure that you can see: Any grab bars or handrails. First and last steps. Where the edge of each step is. Use tools that help you move around (mobility aids) if they are needed. These include: Canes. Walkers. Scooters. Crutches. Turn on the lights when you go into a dark area. Replace any light bulbs as soon as they burn out. Set up your furniture so you have a clear path. Avoid moving your furniture around. If any of your floors are uneven, fix them. If there are any pets around you, be aware of where they are. Review your medicines with your doctor. Some medicines can make you feel dizzy. This can increase your chance of falling. Ask your doctor what other things that you can do to help prevent falls. This information is not intended to replace advice given to you by your health care provider. Make sure you discuss any questions you have with your health care provider. Document Released: 11/19/2008 Document Revised: 07/01/2015 Document Reviewed: 02/27/2014 Elsevier Interactive Patient Education  2017 [employer].

## 2022-06-12 ENCOUNTER — Other Ambulatory Visit: Payer: Self-pay | Admitting: Internal Medicine

## 2022-06-15 MED ORDER — METFORMIN HCL 1000 MG PO TABS: ORAL_TABLET | ORAL | 1 refills | Status: DC

## 2022-06-15 NOTE — Telephone Encounter (Signed)
Resent pt metformin../l,mb

## 2022-06-15 NOTE — Telephone Encounter (Signed)
Patient called back requesting that this medication be sent.  She has follow up appoinment in August, 2024

## 2022-06-15 NOTE — Addendum Note (Signed)
Addended by: Deatra James on: 06/15/2022 04:01 PM   Modules accepted: Orders

## 2022-06-28 ENCOUNTER — Ambulatory Visit: Payer: 59 | Admitting: Cardiology

## 2022-07-08 ENCOUNTER — Emergency Department (HOSPITAL_COMMUNITY): Payer: 59

## 2022-07-08 ENCOUNTER — Encounter (HOSPITAL_COMMUNITY): Payer: Self-pay | Admitting: Emergency Medicine

## 2022-07-08 ENCOUNTER — Other Ambulatory Visit: Payer: Self-pay

## 2022-07-08 ENCOUNTER — Emergency Department (HOSPITAL_COMMUNITY)
Admission: EM | Admit: 2022-07-08 | Discharge: 2022-07-09 | Disposition: A | Discharge status: Home / Self Care | Payer: 59 | Attending: Emergency Medicine | Admitting: Emergency Medicine

## 2022-07-08 DIAGNOSIS — S8991XA Unspecified injury of right lower leg, initial encounter: Secondary | ICD-10-CM

## 2022-07-08 DIAGNOSIS — I10 Essential (primary) hypertension: Secondary | ICD-10-CM | POA: Diagnosis not present

## 2022-07-08 DIAGNOSIS — W19XXXA Unspecified fall, initial encounter: Secondary | ICD-10-CM

## 2022-07-08 DIAGNOSIS — N183 Chronic kidney disease, stage 3 unspecified: Secondary | ICD-10-CM | POA: Diagnosis not present

## 2022-07-08 DIAGNOSIS — E1122 Type 2 diabetes mellitus with diabetic chronic kidney disease: Secondary | ICD-10-CM | POA: Insufficient documentation

## 2022-07-08 DIAGNOSIS — E039 Hypothyroidism, unspecified: Secondary | ICD-10-CM | POA: Insufficient documentation

## 2022-07-08 DIAGNOSIS — Z1152 Encounter for screening for COVID-19: Secondary | ICD-10-CM | POA: Insufficient documentation

## 2022-07-08 DIAGNOSIS — I480 Paroxysmal atrial fibrillation: Secondary | ICD-10-CM | POA: Diagnosis not present

## 2022-07-08 DIAGNOSIS — I5032 Chronic diastolic (congestive) heart failure: Secondary | ICD-10-CM | POA: Diagnosis not present

## 2022-07-08 DIAGNOSIS — S0990XA Unspecified injury of head, initial encounter: Secondary | ICD-10-CM | POA: Insufficient documentation

## 2022-07-08 DIAGNOSIS — I130 Hypertensive heart and chronic kidney disease with heart failure and stage 1 through stage 4 chronic kidney disease, or unspecified chronic kidney disease: Secondary | ICD-10-CM | POA: Insufficient documentation

## 2022-07-08 DIAGNOSIS — M25562 Pain in left knee: Secondary | ICD-10-CM | POA: Insufficient documentation

## 2022-07-08 DIAGNOSIS — M25561 Pain in right knee: Secondary | ICD-10-CM | POA: Diagnosis not present

## 2022-07-08 DIAGNOSIS — R509 Fever, unspecified: Secondary | ICD-10-CM | POA: Diagnosis not present

## 2022-07-08 DIAGNOSIS — R053 Chronic cough: Secondary | ICD-10-CM

## 2022-07-08 DIAGNOSIS — Z79899 Other long term (current) drug therapy: Secondary | ICD-10-CM | POA: Insufficient documentation

## 2022-07-08 DIAGNOSIS — D649 Anemia, unspecified: Secondary | ICD-10-CM | POA: Diagnosis not present

## 2022-07-08 DIAGNOSIS — I2510 Atherosclerotic heart disease of native coronary artery without angina pectoris: Secondary | ICD-10-CM | POA: Insufficient documentation

## 2022-07-08 DIAGNOSIS — W01198A Fall on same level from slipping, tripping and stumbling with subsequent striking against other object, initial encounter: Secondary | ICD-10-CM | POA: Diagnosis not present

## 2022-07-08 DIAGNOSIS — M1711 Unilateral primary osteoarthritis, right knee: Secondary | ICD-10-CM | POA: Diagnosis not present

## 2022-07-08 DIAGNOSIS — M25461 Effusion, right knee: Secondary | ICD-10-CM | POA: Diagnosis not present

## 2022-07-08 DIAGNOSIS — R059 Cough, unspecified: Secondary | ICD-10-CM | POA: Diagnosis not present

## 2022-07-08 DIAGNOSIS — Z043 Encounter for examination and observation following other accident: Secondary | ICD-10-CM | POA: Diagnosis not present

## 2022-07-08 DIAGNOSIS — Z7984 Long term (current) use of oral hypoglycemic drugs: Secondary | ICD-10-CM | POA: Insufficient documentation

## 2022-07-08 DIAGNOSIS — Y92002 Bathroom of unspecified non-institutional (private) residence single-family (private) house as the place of occurrence of the external cause: Secondary | ICD-10-CM | POA: Insufficient documentation

## 2022-07-08 LAB — CBG MONITORING, ED: Glucose-Capillary: 124 mg/dL — ABNORMAL HIGH (ref 70–99)

## 2022-07-08 NOTE — ED Triage Notes (Signed)
Pt in after fall today, no thinners or LOC. Pt states she was getting up from commode after diarrhea and her legs gave out while trying to get up, ending up with legs going into a split. Pt landed onto both knees, hit back of head on a heat vent. Mostly c/o bilat knee and head pain

## 2022-07-09 ENCOUNTER — Emergency Department (HOSPITAL_COMMUNITY): Payer: 59

## 2022-07-09 DIAGNOSIS — S80911A Unspecified superficial injury of right knee, initial encounter: Secondary | ICD-10-CM | POA: Diagnosis not present

## 2022-07-09 DIAGNOSIS — R509 Fever, unspecified: Secondary | ICD-10-CM | POA: Diagnosis not present

## 2022-07-09 DIAGNOSIS — S0990XA Unspecified injury of head, initial encounter: Secondary | ICD-10-CM | POA: Diagnosis not present

## 2022-07-09 DIAGNOSIS — R059 Cough, unspecified: Secondary | ICD-10-CM | POA: Diagnosis not present

## 2022-07-09 LAB — CBC
HCT: 33.3 % — ABNORMAL LOW (ref 36.0–46.0)
Hemoglobin: 10.8 g/dL — ABNORMAL LOW (ref 12.0–15.0)
MCH: 33.4 pg (ref 26.0–34.0)
MCHC: 32.4 g/dL (ref 30.0–36.0)
MCV: 103.1 fL — ABNORMAL HIGH (ref 80.0–100.0)
Platelets: 206 10*3/uL (ref 150–400)
RBC: 3.23 MIL/uL — ABNORMAL LOW (ref 3.87–5.11)
RDW: 12.8 % (ref 11.5–15.5)
WBC: 8.7 10*3/uL (ref 4.0–10.5)
nRBC: 0 % (ref 0.0–0.2)

## 2022-07-09 LAB — URINALYSIS, ROUTINE W REFLEX MICROSCOPIC
Bilirubin Urine: NEGATIVE
Glucose, UA: NEGATIVE mg/dL
Hgb urine dipstick: NEGATIVE
Ketones, ur: NEGATIVE mg/dL
Leukocytes,Ua: NEGATIVE
Nitrite: NEGATIVE
Protein, ur: NEGATIVE mg/dL
Specific Gravity, Urine: 1.009 (ref 1.005–1.030)
pH: 5 (ref 5.0–8.0)

## 2022-07-09 LAB — RESP PANEL BY RT-PCR (RSV, FLU A&B, COVID)  RVPGX2
Influenza A by PCR: NEGATIVE
Influenza B by PCR: NEGATIVE
Resp Syncytial Virus by PCR: NEGATIVE
SARS Coronavirus 2 by RT PCR: NEGATIVE

## 2022-07-09 LAB — BASIC METABOLIC PANEL
Anion gap: 12 (ref 5–15)
BUN: 28 mg/dL — ABNORMAL HIGH (ref 8–23)
CO2: 21 mmol/L — ABNORMAL LOW (ref 22–32)
Calcium: 9 mg/dL (ref 8.9–10.3)
Chloride: 102 mmol/L (ref 98–111)
Creatinine, Ser: 2.03 mg/dL — ABNORMAL HIGH (ref 0.44–1.00)
GFR, Estimated: 24 mL/min — ABNORMAL LOW (ref >60)
Glucose, Bld: 134 mg/dL — ABNORMAL HIGH (ref 70–99)
Potassium: 4.8 mmol/L (ref 3.5–5.1)
Sodium: 135 mmol/L (ref 135–145)

## 2022-07-09 MED ORDER — DOXYCYCLINE HYCLATE 100 MG PO CAPS: 100.0000 mg | ORAL_CAPSULE | Freq: Two times a day (BID) | ORAL | 0 refills | Status: AC

## 2022-07-09 MED ORDER — ACETAMINOPHEN 500 MG PO TABS
1000.0000 mg | ORAL_TABLET | Freq: Once | ORAL | Status: AC
  Administered 2022-07-09: 1000 mg via ORAL
  Filled 2022-07-09: qty 2

## 2022-07-09 NOTE — Progress Notes (Signed)
Orthopedic Tech Progress Note Patient Details:  Mary Mccarthy [birth year] [id number]  Ortho Devices Type of Ortho Device: Knee Sleeve Ortho Device/Splint Location: rle Ortho Device/Splint Interventions: Ordered, Application, Adjustment   Post Interventions Patient Tolerated: Well Instructions Provided: Care of device, Adjustment of device  Trinna Post 07/09/2022, 5:47 AM

## 2022-07-09 NOTE — ED Provider Notes (Signed)
[place] EMERGENCY DEPARTMENT AT [facility] Provider Note  CSN: [id number] Arrival date & time: 07/08/22 2254  Chief Complaint(s) Fall and Leg Injury  HPI Mary Mccarthy is a 82 y.o. female with a past medical history listed below who presents to the emergency department after a fall at home in the morning around 8 AM.  Patient reports that she was in the bathroom when her legs gave out.  She tried to hold herself up but was unable to and fell to the ground.  She reports hitting her head on the wall.  No loss of consciousness.  Patient is not anticoagulated.  She is endorsing occipital headache since the fall.  She is also endorsing bilateral knee pain, right greater than left since the fall.  Patient was able to get herself up and go on with her day however since the pain persistent and slowly became worse, she presented for evaluation.  Patient denies any associated chest pain or shortness of breath.  No abdominal pain.  No other extremity pain.  No neck pain.  Patient has a dry cough.  No reported fevers.  Noted to have low-grade fever upon arrival.  The history is provided by the patient.    Past Medical History Past Medical History:  Diagnosis Date   ALLERGIC RHINITIS 06/24/2007   Anemia    AVM (arteriovenous malformation) of colon    Blood transfusion without reported diagnosis 05/2015   COLONIC POLYPS, HX OF 06/24/2007   Coronary artery disease, non-occlusive 05/2015   Trop + w/ Acute Anemia =>CATH: small RI - Ostial 60%, ostial RCA 30% and dLAD 40-50%;; 9/'21: Cor Ca Score 624. Mild (25-49%) prox RCA & LAD,; Moderate (50-69%) Ostial Small RI & prox LCx.     DEPRESSION 10/08/2006   DIABETES MELLITUS, TYPE II 10/05/2006   GERD 06/24/2007   History of CVA (cerebrovascular accident) 08/10/2020   08/2020- found to be in afib with RVR, started on Eliquis (shower of emboli from A. fib as complication of severe AS)   HYPERLIPIDEMIA 10/08/2006   HYPERTENSION 10/08/2006    INSOMNIA 09/24/2008   Left knee DJD    Nonsustained paroxysmal ventricular tachycardia (HCC) 10/2018   Post-TAVR Zio Patch: Frequent (206) runs of NSVT - Fastest 5 beats 239 bpm. Longest 9.3 Sec avg 135 bpm. (? possibly Afib w/ aberrancy)   Osteoporosis 03/10/2016   PAF (paroxysmal atrial fibrillation) (HCC)    PAF with RVR: CHA2DS2-VASc 9.  On Eliquis and amiodarone. 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 Clifton James & Dr. Laneta Simmers   Severe aortic stenosis 08/2020   Progression from Mod-Severe AS to Severe AS - noted on Echo 08/2020 -> (mean gradient progressed from 34 to 42 mmHg) this was in setting of new onset A. fib RVR, acute diastolic HF and shower of emboli CVA. ->  Referred for TAVR, completed 10/26/2020   Patient Active Problem List   Diagnosis Date Noted   Urinary frequency 03/29/2022   UTI (urinary tract infection) 03/29/2022   Hypothyroidism 05/28/2021   Vitamin D deficiency 04/20/2021   Abnormal TSH 04/20/2021   Encounter for well adult exam with abnormal findings 02/16/2021   Long term current use of amiodarone for rate-rhythm control of A. fib 11/28/2020   Chronic respiratory failure (HCC) 11/22/2020   Dehydration 11/02/2020   Diabetes mellitus type 2 in obese 11/02/2020  CKD (chronic kidney disease), stage III (HCC) 11/01/2020   Transient post TAVR CHB -> not seen on follow-up Zio patch 10/28/2020   S/P TAVR (transcatheter aortic valve replacement) 10/26/2020   Melena 10/20/2020   Symptomatic anemia 10/19/2020   Acute bronchitis 09/21/2020   Hypercoagulable state due to atrial fibrillation (HCC) 08/23/2020   PAF with RVR: CHA2DS2-VASc 9.  On Eliquis and amiodarone. 08/11/2020   History of CVA (cerebrovascular accident) 08/10/2020   Hypomagnesemia 08/10/2020   Severe aortic stenosis 08/2020   Aortic atherosclerosis (HCC) 03/18/2020    B12 deficiency 09/21/2019   Cough 03/14/2018   Wheezing 03/14/2018   Anxiety 09/11/2017   Osteoporosis 03/10/2016   Coronary artery disease, non-occlusive 05/24/2015   GI bleed    Pulmonary hypertension (HCC)    CHF (congestive heart failure), NYHA class II, chronic, diastolic (HCC) 05/20/2015   Gastric ulceration    AVM (arteriovenous malformation) of colon    Demand ischemia of myocardium    Right knee pain 05/21/2014   Anemia, iron deficiency 05/20/2013   MENOPAUSAL DISORDER 12/07/2009   INSOMNIA 09/24/2008   ALLERGIC RHINITIS 06/24/2007   GERD 06/24/2007   COLONIC POLYPS, HX OF 06/24/2007   Hyperlipidemia associated with type 2 diabetes mellitus (HCC) 10/08/2006   Anxiety with depression 10/08/2006   Essential hypertension 10/08/2006   Peptic ulcer 10/08/2006   Type 2 diabetes mellitus with hyperglycemia, without long-term current use of insulin (HCC) 10/05/2006   Morbid obesity (HCC) 10/05/2006   Home Medication(s) Prior to Admission medications   Medication Sig Start Date End Date Taking? Authorizing Provider  doxycycline (VIBRAMYCIN) 100 MG capsule Take 1 capsule (100 mg total) by mouth 2 (two) times daily for 7 days. 07/09/22 07/16/22 Yes Dawson Albers, Amadeo Garnet, MD  acetaminophen (TYLENOL) 500 MG tablet Take 500 mg by mouth every 6 (six) hours as needed for moderate pain.    [provider]  ALPRAZolam Prudy Feeler) 0.25 MG tablet TAKE 1 TABLET BY MOUTH THREE TIMES A DAY AS NEEDED FOR ANXIETY 04/05/22   Corwin Levins, MD  amiodarone (PACERONE) 200 MG tablet TAKE 1 TABLET BY MOUTH EVERY DAY 11/30/21   Janetta Hora, PA-C  amLODipine (NORVASC) 10 MG tablet Take 1 tablet (10 mg total) by mouth daily. 02/21/22   [name], MD  atorvastatin (LIPITOR) 40 MG tablet TAKE 1 TABLET BY MOUTH EVERY DAY 09/06/21   [name], MD  blood glucose meter kit and supplies Dispense based on patient and insurance preference. Use up to four times daily as directed. E11.9 04/15/18    Corwin Levins, MD  Blood Glucose Monitoring Suppl (ONE TOUCH ULTRA 2) w/Device KIT Use as directed daily 06/01/15   Corwin Levins, MD  cephALEXin (KEFLEX) 500 MG capsule Take 1 capsule (500 mg total) by mouth 3 (three) times daily. 03/15/22   Corwin Levins, MD  citalopram (CELEXA) 10 MG tablet TAKE 1 TABLET BY MOUTH EVERY DAY 02/13/22   Corwin Levins, MD  [employer] VITAMIN B12 1000 MCG tablet TAKE 1 TABLET BY MOUTH EVERY DAY 02/13/22   Corwin Levins, MD  furosemide (LASIX) 40 MG tablet TAKE 1 TABLET BY MOUTH EVERY DAY 04/25/22   Corwin Levins, MD  iron polysaccharides (NU-IRON) 150 MG capsule Take 1 capsule (150 mg total) by mouth 2 (two) times daily. 05/25/21   Corwin Levins, MD  KLOR-CON M20 20 MEQ tablet TAKE 1 TABLET BY MOUTH EVERY DAY 03/24/22   [name], MD  Lancets [facility]  DELICA PLUS LANCET33G) MISC USE AS DIRECTED TEST 1-2 TIMES A DAY 09/05/20   Corwin Levins, MD  levothyroxine (SYNTHROID) 25 MCG tablet TAKE 1 TABLET BY MOUTH EVERY DAY 02/13/22   Corwin Levins, MD  losartan (COZAAR) 100 MG tablet TAKE 1 TABLET BY MOUTH EVERY DAY 05/08/22   Corwin Levins, MD  losartan (COZAAR) 50 MG tablet TAKE 1 TABLET BY MOUTH EVERY DAY 05/08/22   [name], MD  metFORMIN (GLUCOPHAGE) 1000 MG tablet Take 1 tablet by mouth in the AM and 1/2 in the PM 06/15/22   Corwin Levins, MD  metoprolol succinate (TOPROL-XL) 25 MG 24 hr tablet TAKE 1/2 TABLET BY MOUTH EVERY DAY 05/08/22   Corwin Levins, MD  mirtazapine (REMERON) 15 MG tablet TAKE 1 TABLET BY MOUTH EVERYDAY AT BEDTIME 11/04/21   Corwin Levins, MD  [facility] ULTRA test strip USE AS INSTRUCTED TEST 1-2 TIMES A DAY 10/12/20   Corwin Levins, MD  pantoprazole (PROTONIX) 40 MG tablet TAKE 1 TABLET BY MOUTH TWICE A DAY 03/17/22   Janetta Hora, PA-C  solifenacin (VESICARE) 5 MG tablet Take 1 tablet (5 mg total) by mouth daily. 03/27/22   Corwin Levins, MD                                                                                                                                     Allergies Ibuprofen  Review of Systems Review of Systems As noted in HPI  Physical Exam Vital Signs  I have reviewed the triage vital signs BP (!) 112/55 (BP Location: Right Arm)   Pulse (!) 56   Temp 98.2 F (36.8 C) (Oral)   Resp 13   Ht 5\' 2"  (1.575 m)   Wt 89.8 kg   SpO2 96%   BMI 36.21 kg/m   Physical Exam Constitutional:      General: She is not in acute distress.    Appearance: She is well-developed. She is not diaphoretic.  HENT:     Head: Normocephalic and atraumatic.     Right Ear: External ear normal.     Left Ear: External ear normal.     Nose: Nose normal.  Eyes:     General: No scleral icterus.       Right eye: No discharge.        Left eye: No discharge.     Conjunctiva/sclera: Conjunctivae normal.     Pupils: Pupils are equal, round, and reactive to light.  Cardiovascular:     Rate and Rhythm: Normal rate and regular rhythm.     Pulses:          Radial pulses are 2+ on the right side and 2+ on the left side.       Dorsalis pedis pulses are 2+ on the right side and 2+ on the left side.     Heart sounds: Normal heart  sounds. No murmur heard.    No friction rub. No gallop.  Pulmonary:     Effort: Pulmonary effort is normal. No respiratory distress.     Breath sounds: Normal breath sounds. No stridor. No wheezing.  Abdominal:     General: There is no distension.     Palpations: Abdomen is soft.     Tenderness: There is no abdominal tenderness.  Musculoskeletal:     Cervical back: Normal range of motion and neck supple. No bony tenderness.     Thoracic back: No bony tenderness.     Lumbar back: No bony tenderness.     Right knee: No swelling or deformity. Tenderness present.     Left knee: No swelling or deformity. Tenderness present.     Comments: Clavicles stable. Chest stable to AP/Lat compression. Pelvis stable to Lat compression. No obvious extremity deformity. No chest or abdominal wall contusion.  Skin:    General: Skin is  warm and dry.     Findings: No erythema or rash.  Neurological:     Mental Status: She is alert and oriented to person, place, and time.     Comments: Moving all extremities     ED Results and Treatments Labs (all labs ordered are listed, but only abnormal results are displayed) Labs Reviewed  BASIC METABOLIC PANEL - Abnormal; Notable for the following components:      Result Value   CO2 21 (*)    Glucose, Bld 134 (*)    BUN 28 (*)    Creatinine, Ser 2.03 (*)    GFR, Estimated 24 (*)    All other components within normal limits  CBC - Abnormal; Notable for the following components:   RBC 3.23 (*)    Hemoglobin 10.8 (*)    HCT 33.3 (*)    MCV 103.1 (*)    All other components within normal limits  CBG MONITORING, ED - Abnormal; Notable for the following components:   Glucose-Capillary 124 (*)    All other components within normal limits  RESP PANEL BY RT-PCR (RSV, FLU A&B, COVID)  [id number]  URINALYSIS, ROUTINE W REFLEX MICROSCOPIC                                                                                                                         EKG  EKG Interpretation  Date/Time:  Saturday July 08 2022 23:04:41 EDT Ventricular Rate:  59 PR Interval:    QRS Duration: 100 QT Interval:  434 QTC Calculation: 429 R Axis:   41 Text Interpretation: Sinus rhythm Abnormal ECG When compared with ECG of 02-Nov-2020 09:54, PREVIOUS ECG IS PRESENT Confirmed by Drema Pry [phone]) on 07/09/2022 2:58:58 AM       Radiology DG Chest 2 View  Result Date: 07/09/2022 CLINICAL DATA:  82 year old female with history of cough and fever. EXAM: CHEST - 2 VIEW COMPARISON:  Chest x-ray 11/01/2020. FINDINGS: Lung volumes are normal. No consolidative airspace disease. No pleural effusions. Mild diffuse interstitial  prominence and peribronchial cuffing, chronic and similar to the prior study. No pneumothorax. No evidence of pulmonary edema. Heart size is mildly enlarged. Upper mediastinal  contours are within normal limits. Status post TAVR with what appears to be a CoreValve. IMPRESSION: 1. No radiographic evidence of acute cardiopulmonary disease. 2. Chronic lung findings suggesting chronic bronchitis, similar to the prior study, as above. 3. Mild cardiomegaly. 4. Status post TAVR. Electronically Signed   By: Trudie Reed M.D.   On: 07/09/2022 05:36   CT Head Wo Contrast  Result Date: 07/09/2022 CLINICAL DATA:  Minor head trauma EXAM: CT HEAD WITHOUT CONTRAST TECHNIQUE: Contiguous axial images were obtained from the base of the skull through the vertex without intravenous contrast. RADIATION DOSE REDUCTION: This exam was performed according to the departmental dose-optimization program which includes automated exposure control, adjustment of the mA and/or kV according to patient size and/or use of iterative reconstruction technique. COMPARISON:  MRI brain 08/10/2020.  CT head 08/10/2020. FINDINGS: Brain: Diffuse cerebral atrophy. Ventricular dilatation consistent with central atrophy. Low-attenuation changes in the deep white matter consistent with small vessel ischemia. No abnormal extra-axial fluid collections. No mass effect or midline shift. Gray-white matter junctions are distinct. Basal cisterns are not effaced. No acute intracranial hemorrhage. Vascular: No hyperdense vessel or unexpected calcification. Skull: Normal. Negative for fracture or focal lesion. Sinuses/Orbits: Paranasal sinuses are clear. Opacification of left mastoid air cells. Other: None. IMPRESSION: 1. No acute intracranial abnormalities. Chronic atrophy and small vessel ischemic changes. 2. Left mastoid effusion. Electronically Signed   By: Burman Nieves M.D.   On: 07/09/2022 01:18   DG Knee Complete 4 Views Left  Result Date: 07/09/2022 CLINICAL DATA:  Fall today landing on both knees. EXAM: LEFT KNEE - COMPLETE 4+ VIEW COMPARISON:  None Available. FINDINGS: No fracture or dislocation. Trace peripheral  degenerative spurring. No significant knee joint effusion. No erosion or focal bone abnormality. Vascular calcifications are seen. IMPRESSION: No fracture or subluxation of the left knee. Electronically Signed   By: Narda Rutherford M.D.   On: 07/09/2022 00:00   DG Knee Complete 4 Views Right  Result Date: 07/08/2022 CLINICAL DATA:  Fall today landing on both knees. EXAM: RIGHT KNEE - COMPLETE 4+ VIEW COMPARISON:  Radiograph 05/21/2014 FINDINGS: No fracture or dislocation. Mild tricompartmental degenerative change. There is a small knee joint effusion. No erosion or focal bone abnormality. Vascular calcifications are seen. IMPRESSION: Mild tricompartmental degenerative change and small joint effusion. No fracture or subluxation. Electronically Signed   By: Narda Rutherford M.D.   On: 07/08/2022 23:59    Medications Ordered in ED Medications  acetaminophen (TYLENOL) tablet 1,000 mg (1,000 mg Oral Given 07/09/22 0303)   Procedures Procedures  (including critical care time) Medical Decision Making / ED Course   Medical Decision Making Amount and/or Complexity of Data Reviewed Labs: ordered. Decision-making details documented in ED Course. Radiology: ordered and independent interpretation performed. Decision-making details documented in ED Course. ECG/medicine tests: ordered and independent interpretation performed. Decision-making details documented in ED Course.  Risk OTC drugs. Prescription drug management.    Patient presents after a fall at home with headache and bilateral knee pain.  Noted to be febrile upon arrival.  Has a cough but no other infectious symptoms.  CT head obtained to rule out ICH and negative.  No neck pain concerning for cervical injury.  Plain films of bilateral knees notable for degenerative joint disease but no obvious deformity or fracture. Chest x-ray without evidence of pneumonia, pneumothorax, pulmonary  edema or pleural effusions. Radiology noted chronic  bronchitis. CBC without leukocytosis.  Mild anemia. Metabolic panel without significant electrolyte derangements.  Stable renal function. UA without evidence of infection. Viral panel negative  Patient provided with Tylenol for pain and and fever. Knee sleeve for support. Patient able to use her walker at home. Ambulated here as well.    Final Clinical Impression(s) / ED Diagnoses Final diagnoses:  Fever in adult  Chronic cough  Fall in home, initial encounter  Injury of right knee, initial encounter   The patient appears reasonably screened and/or stabilized for discharge and I doubt any other medical condition or other [facility] requiring further screening, evaluation, or treatment in the ED at this time. I have discussed the findings, Dx and Tx plan with the patient/family who expressed understanding and agree(s) with the plan. Discharge instructions discussed at length. The patient/family was given strict return precautions who verbalized understanding of the instructions. No further questions at time of discharge.  Disposition: Discharge  Condition: Good  ED Discharge Orders          Ordered    doxycycline (VIBRAMYCIN) 100 MG capsule  2 times daily        07/09/22 0604             Follow Up: Corwin Levins, MD [address] [place] [address] [phone]  Call  to schedule an appointment for close follow up  Tarry Kos, MD [address] [place] [address] [phone]  Call  to schedule an appointment for close follow up for knee pain    The patient appears reasonably screened and/or stabilized for discharge and I doubt any other medical condition or other [facility] requiring further screening, evaluation, or treatment in the ED at this time. I have discussed the findings, Dx and Tx plan with the patient/family who expressed understanding and agree(s) with the plan. Discharge instructions discussed at length. The patient/family was given strict  return precautions who verbalized understanding of the instructions. No further questions at time of discharge.  Disposition: Discharge  Condition: Good  ED Discharge Orders          Ordered    doxycycline (VIBRAMYCIN) 100 MG capsule  2 times daily        07/09/22 0604            Follow Up: Corwin Levins, MD [address] [place] [address] [phone]  Call  to schedule an appointment for close follow up  Tarry Kos, MD [address] [place] [address] [phone]  Call  to schedule an appointment for close follow up for knee pain     This chart was dictated using voice recognition software.  Despite best efforts to proofread,  errors can occur which can change the documentation meaning.    Nira Conn, MD 07/09/22 [phone]

## 2022-07-09 NOTE — Discharge Instructions (Addendum)
For pain control you may take 1000 mg of Tylenol every 8 hours as needed. 

## 2022-07-13 ENCOUNTER — Telehealth: Payer: Self-pay

## 2022-07-13 NOTE — Telephone Encounter (Signed)
Transition Care Management Follow-up Telephone Call Date of discharge and from where: 07/09/2022 [facility] How have you been since you were released from the hospital? Patient is feeling much better. Any questions or concerns? No  Items Reviewed: Did the pt receive and understand the discharge instructions provided? Yes  Medications obtained and verified? Yes  Other? No  Any new allergies since your discharge? No  Dietary orders reviewed? Yes Do you have support at home? Yes   Follow up appointments reviewed:  PCP Hospital f/u appt confirmed?  Patient stated she would call for an appointment in a few days.  Scheduled to see  on  @ . Specialist Hospital f/u appt confirmed? No  Scheduled to see  on  @ . Are transportation arrangements needed? No  If their condition worsens, is the pt aware to call PCP or go to the Emergency Dept.? Yes Was the patient provided with contact information for the PCP's office or ED? Yes Was to pt encouraged to call back with questions or concerns? Yes  [facility]  [facility] Population Health Community Resource Care Guide   ??[email or web address]  ?? [phone]   [email or web address]: [email or web address]  [email or web address]

## 2022-07-14 ENCOUNTER — Encounter: Payer: Self-pay | Admitting: Cardiology

## 2022-07-14 ENCOUNTER — Ambulatory Visit: Payer: 59 | Attending: Cardiology | Admitting: Cardiology

## 2022-07-14 VITALS — BP 168/70 | HR 55 | Ht 62.0 in | Wt 191.4 lb

## 2022-07-14 DIAGNOSIS — Z952 Presence of prosthetic heart valve: Secondary | ICD-10-CM

## 2022-07-14 DIAGNOSIS — E785 Hyperlipidemia, unspecified: Secondary | ICD-10-CM

## 2022-07-14 DIAGNOSIS — Z79899 Other long term (current) drug therapy: Secondary | ICD-10-CM | POA: Diagnosis not present

## 2022-07-14 DIAGNOSIS — I442 Atrioventricular block, complete: Secondary | ICD-10-CM | POA: Diagnosis not present

## 2022-07-14 DIAGNOSIS — I10 Essential (primary) hypertension: Secondary | ICD-10-CM | POA: Diagnosis not present

## 2022-07-14 DIAGNOSIS — I5032 Chronic diastolic (congestive) heart failure: Secondary | ICD-10-CM

## 2022-07-14 DIAGNOSIS — E1169 Type 2 diabetes mellitus with other specified complication: Secondary | ICD-10-CM | POA: Diagnosis not present

## 2022-07-14 DIAGNOSIS — I480 Paroxysmal atrial fibrillation: Secondary | ICD-10-CM

## 2022-07-14 DIAGNOSIS — I2489 Other forms of acute ischemic heart disease: Secondary | ICD-10-CM | POA: Diagnosis not present

## 2022-07-14 DIAGNOSIS — I2510 Atherosclerotic heart disease of native coronary artery without angina pectoris: Secondary | ICD-10-CM | POA: Diagnosis not present

## 2022-07-14 DIAGNOSIS — D6869 Other thrombophilia: Secondary | ICD-10-CM

## 2022-07-14 NOTE — Progress Notes (Signed)
Primary Care Provider: Corwin Levins, MD [facility] Cardiologist: Bryan Lemma, MD Electrophysiologist: Thurmon Fair, MD TAVR/Structural Heart Team: Dr. Clifton James, (Dr. Excell Seltzer) Dr. Laneta Simmers;    Clinic Note: Chief Complaint  Patient presents with   Follow-up    Delayed 6 months.  Doing well. Recent ER visit for fall but had no cardiac issues.   Atrial Fibrillation    Not that she can notice.   ===================================  ASSESSMENT/PLAN   Problem List Items Addressed This Visit       Cardiology Problems   Transient post TAVR CHB -> not seen on follow-up Zio patch (Chronic)    No further block.  Continue to monitor.  If patient comes more bradycardic and would stop Toprol.      PAF with RVR: CHA2DS2-VASc 9.  On amiodarone. (Chronic)    As far she can tell, no breakthrough spells of A-fib.  Remains on amiodarone for rhythm and rate control.  Baseline bradycardia, but not too slow.  No more fatigue. CHA2DS2-VASc score is 8-9 but has bled score 3-4.  At this point we are avoiding anticoagulation due to recurrent GI bleed.  She does understand the risks versus benefits.  Plan: Continue amiodarone 100 mg daily and very low-dose beta-blocker which we can potentially stop if rate gets lower. No anticoagulation as noted.      Hyperlipidemia associated with type 2 diabetes mellitus (HCC) (Chronic)    Outstanding lipid control as of February.  Continue atorvastatin.      Essential hypertension (Chronic)    Her blood pressure was up has been up and down.  I am leery of being overly aggressive.  If her pressures are still elevated I would recommend converting from losartan to a higher potency/new generation ARB and otherwise continue current meds.      Relevant Orders   EKG 12-Lead (Completed)   Demand ischemia of myocardium (Chronic)   Coronary artery disease, non-occlusive - Primary (Chronic)    Minimal CAD on cath and coronary CTA.  No angina.  No aspirin  because of anemia.  On beta-blocker, statin, amlodipine.  No change.      Relevant Orders   EKG 12-Lead (Completed)   CHF (congestive heart failure), NYHA class II, chronic, diastolic (HCC) (Chronic)    Class I-II symptoms really more limited by physical disabilities as opposed to CHF.  She takes her 40 mg Lasix daily but has not taken any additional doses.  He is on low-dose Toprol plus high-dose losartan along with amlodipine 10 mg for elevated blood pressure. BP is high today. (We can consider switching from 150 of losartan to a more potent ARB if her BP continues to be elevated). I am leery of overtreating hypertension for fear of her tendency for orthostatic hypotension as well.        Other   S/P TAVR (transcatheter aortic valve replacement) (Chronic)    Last echo looked good.  Due for follow-up echo October 2024.  Discussed SBE prophylaxis.      Relevant Orders   EKG 12-Lead (Completed)   ECHOCARDIOGRAM COMPLETE   Long term current use of amiodarone for rate-rhythm control of A. fib (Chronic)    Needs annual eye exam, thyroid levels and LFTs checked.   Would like for her to avoid blood draws.  When asked that PCP please check ESR and CRP along with PFTs and LFTs with routine follow-up labs.      Hypercoagulable state due to atrial fibrillation (HCC) (Chronic)  See above.  No longer on anticoagulation because of high HAS BLED score and recurrent GI bleed       ===================================  HPI:    Noela Brothers is a 82 y.o. female with a PMH notable for PAF (h/o TIA/CVA, but no longer on DOAC  2/2 recurrent GIB), Severe AS s/p TAVR (complicated by transient CHB), HTN, HLD, minimal CAD who presents today for 45-month follow-up.  CHA2DS2-VASc score 8, HAS -BLED score 3  [name] was last seen on January 02, 2022  Recent Hospitalizations:  07/08/2022 ER vist -> fell @ home - was in bathroom & legs "gave out". Unable to hold herself up & fell to ground  - hit head on wall - NO LOC. Landed on both knees.  Was able to get up & start her day, but went to ER for Bilateral Knee pain.  => NO CP or SOB. Noted to have low grade Fever on arrival,  Head CT no ICH. UA negative. => given Rx for doxycycline ??   Reviewed  CV studies:    The following studies were reviewed today: (if available, images/films reviewed: From Epic Chart or Care Everywhere) No new studies:  Interval History:   Ivette Castronova returns here today overall pretty stable from a cardiac standpoint.  She says she occasionally feels a little shaking in her chest but nothing really prolonged.  Nothing to make her think patient had any abnormal feelings. Unfortunately, she is not able to be as active as she would like to be.  She does what she wants to do around the house pacing herself but she just feels somewhat isolated at home because she does not really have anybody to take around.  But otherwise she is pretty happy. She denies any angina or heart failure symptoms.  No PND orthopnea.  No chest pain or pressure with rest or exertion to the extent that she is able to exert herself.  Mild at the end of day edema but goes down with elevation.  Importantly, she denies any melena, hematochezia or hematuria.  Also no syncope/near syncope or TIA/amaurosis fugax.  She said that her blood pressure is high today, but she has not had issues with BP.  She has not yet taken her medications and pelvic she is resting and stress to get here.  CV Review of Symptoms (Summary): no chest pain or dyspnea on exertion positive for - edema and a little unsteady gait.  Tires out a little more easily than she would have been used to.  Feels occasional irregular heartbeat/palpitations but nothing prolonged. negative for - orthopnea, paroxysmal nocturnal dyspnea, rapid heart rate, shortness of breath, or syncope or near syncope, TIA or amaurosis fugax, claudication.  REVIEWED OF SYSTEMS   Review of Systems   Constitutional:  Positive for malaise/fatigue (Not as energetic as she used to be.  But just paces herself.). Negative for weight loss.  HENT:  Negative for nosebleeds.   Gastrointestinal:  Negative for blood in stool.  Genitourinary:  Negative for hematuria.  Musculoskeletal:  Positive for back pain and joint pain.       Her knees and left leg are still hurting her from her fall.  Still little sore bruise.  Has not been able to do her exercises since her fall.  Neurological:  Positive for dizziness (Little positional) and weakness (Her legs to give out on her.). Negative for focal weakness.  Psychiatric/Behavioral:  Positive for memory loss. Negative for depression (She seems to be happy,  just a little sad because she is homebound-able to get a without having someone take her out). The patient is not nervous/anxious (Panic attacks seem to be pretty much at bay.).    I have reviewed and (if needed) personally updated the patient's problem list, medications, allergies, past medical and surgical history, social and family history.   PAST MEDICAL HISTORY   Past Medical History:  Diagnosis Date   ALLERGIC RHINITIS 06/24/2007   Anemia    AVM (arteriovenous malformation) of colon    Blood transfusion without reported diagnosis 05/2015   COLONIC POLYPS, HX OF 06/24/2007   Coronary artery disease, non-occlusive 05/2015   Trop + w/ Acute Anemia =>CATH: small RI - Ostial 60%, ostial RCA 30% and dLAD 40-50%;; 9/'21: Cor Ca Score 624. Mild (25-49%) prox RCA & LAD,; Moderate (50-69%) Ostial Small RI & prox LCx.     DEPRESSION 10/08/2006   DIABETES MELLITUS, TYPE II 10/05/2006   GERD 06/24/2007   History of CVA (cerebrovascular accident) 08/10/2020   08/2020- found to be in afib with RVR, started on Eliquis (shower of emboli from A. fib as complication of severe AS)   HYPERLIPIDEMIA 10/08/2006   HYPERTENSION 10/08/2006   INSOMNIA 09/24/2008   Left knee DJD    Nonsustained paroxysmal ventricular  tachycardia (HCC) 10/2018   Post-TAVR Zio Patch: Frequent (206) runs of NSVT - Fastest 5 beats 239 bpm. Longest 9.3 Sec avg 135 bpm. (? possibly Afib w/ aberrancy)   Osteoporosis 03/10/2016   PAF (paroxysmal atrial fibrillation) (HCC)    PAF with RVR: CHA2DS2-VASc 9.  On Eliquis and amiodarone. 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 Clifton James & Dr. Laneta Simmers   Severe aortic stenosis 08/2020   Progression from Mod-Severe AS to Severe AS - noted on Echo 08/2020 -> (mean gradient progressed from 34 to 42 mmHg) this was in setting of new onset A. fib RVR, acute diastolic HF and shower of emboli CVA. ->  Referred for TAVR, completed 10/26/2020    PAST SURGICAL HISTORY -> reviewed in epic   Past Cardiac Procedure history:  Procedure  Date   CARDIAC CATHETERIZATION N/A 05/21/2015   Procedure: Left Heart Cath and Coronary Angiography;  Surgeon: [name], MD;  Location: [facility] INVASIVE CV LAB;  Service: Cardiovascular;: Ost RI 60%, Ost RCA 30%, dLAD tapers to small vessel w/ 40-50%. Mildly elevated LVEDP. Normal LV Fxn.   CORONARY CA2+ SCORE / CARDIAC CT ANGIOGRAM  10/09/2019   Calcium score 624.  82nd percentile. Dominant RCA: Mild (25-49%) proximal stenosis-distal bifurcation into PDA and PAV--< RPL branches.  LAD (1 major mid vessel diagonal) diffuse calcified plaque, mild proximal stenosis with minimal distal stenosis.  Small RI moderate ostial disease.  LCx-moderate mixed (50-69%) proximal stenosis.  Small dOM1 disease.  Trileaflet AoV, annular Ca2+ - probable AS   INTRAOPERATIVE TRANSTHORACIC ECHOCARDIOGRAM N/A 10/26/2020   Procedure: INTRAOPERATIVE TRANSTHORACIC ECHOCARDIOGRAM;  Surgeon: Clifton James; Location: [facility] OR; Pre-TAVR well-visualized calcified AoV mean Grad 37 mmHg, AVA 0.72 cm => Post TAVR well-positioned supra-annular 26 mm Medtronic Evolut Pro valve  placed with no PVL.  Mean gradient 14 mmHg.  AVR 1.58 cm.  Normal flow to RCA and LM-LAD.  ; EF 60 to 65%.  Degenerative severe MAC w/ mild MR.   RIGHT/LEFT HEART CATH AND CORONARY ANGIOGRAPHY N/A 09/29/2020   Procedure: RIGHT/LEFT HEART CATH AND CORONARY  ANGIOGRAPHY;  Surgeon: Kathleene Hazel, MD;  Location: [facility] INVASIVE CV LAB;  Service: Cardiovascular; pre-TAVR:  pRCA 20%, m-dRCA 30%. D LM-pLAD 20%. RI 30%, mLAD 20%.  Severe AS with mean gradient measured at 52.8 mmHg.   TRANSCATHETER AORTIC VALVE REPLACEMENT, TRANSFEMORAL N/A 10/26/2020   Procedure: TRANSCATHETER AORTIC VALVE REPLACEMENT, TRANSFEMORAL;  Surgeon: Kathleene Hazel, MD;  Location: [facility] OR;  Service: Open Heart Surgery;; Medtronic Evolut-Pro + (size 26 mm, model # EVPROPLUS -26US, serial # [id number]); transient high-grade AV block noted so PPM left in place.   TRANSTHORACIC ECHOCARDIOGRAM  08/27/2020   Admitted for CVA/Afib RVR & CHF (Progression to SEVERE SYMPTOMATIC AS):  Severe Calcific Aortic Stenosis (VTI AVA estimated 0.86 cm, mean gradient 42 mmHg, V-max 4.36 m/s).  EF 55 to 60%.  Severe concentric LVH.  Unable to determine diastolic parameters.  Moderately elevated PAP.  Mild LA dilation.  Mild circumferential pericardial effusion.  Trivial MR.  Severe MAC.   TRANSTHORACIC ECHOCARDIOGRAM  11/25/2020   1 Month s/p TAVR: EF 55 to 60%.  No R WMA.  GR 1 DD.  Elevated LVEDP.  Normal RV size with mildly elevated RVP estimated 44 mmHg.  Oscillating density in the RV suspect calcified chordal apparatus versus calcified thrombus.  Moderate LA dilation.  Normal IVC/RA P => Well-positioned 26 mm Medtronic Evolut Pro THVwith mean AOV gradient 11 mmHg.  Trivial PVL   TTE 11/09/2021:  normal LV function, 60 to 65%.  No RWMA.  GR 2 DD with moderately dilated LA.Marland Kitchen  Severe MAC with mild MS.  Normal RV function with mildly elevated PAP.  26 mm CoreValve TAVR (10/26/2020) MG of 9.2 mg.  Perivalvular leak noted.  No significant change from  prior.  Mobile echodensity in the RV not well visualized       Immunization History  Administered Date(s) Administered   Fluad Quad(high Dose 65+) 12/26/2018, 12/06/2019, 10/13/2020   Influenza Split 12/21/2010, 12/28/2011   Influenza Whole 12/01/2005, 03/26/2009, 12/07/2009   Influenza, High Dose Seasonal PF 11/19/2012, 11/18/2013, 02/18/2015, 12/28/2015, 12/07/2016, 10/17/2017   PFIZER(Purple Top)SARS-COV-2 Vaccination 05/01/2019, 05/26/2019, 05/12/2020   Pneumococcal Conjugate-13 12/03/2012   Pneumococcal Polysaccharide-23 11/06/2005   Td 09/24/2008   Tdap 09/13/2018, 09/13/2018    MEDICATIONS/ALLERGIES   Current Meds  Medication Sig   acetaminophen (TYLENOL) 500 MG tablet Take 500 mg by mouth every 6 (six) hours as needed for moderate pain.   ALPRAZolam (XANAX) 0.25 MG tablet TAKE 1 TABLET BY MOUTH THREE TIMES A DAY AS NEEDED FOR ANXIETY   amiodarone (PACERONE) 200 MG tablet TAKE 1 TABLET BY MOUTH EVERY DAY   amLODipine (NORVASC) 10 MG tablet Take 1 tablet (10 mg total) by mouth daily.   atorvastatin (LIPITOR) 40 MG tablet TAKE 1 TABLET BY MOUTH EVERY DAY   blood glucose meter kit and supplies Dispense based on patient and insurance preference. Use up to four times daily as directed. E11.9   Blood Glucose Monitoring Suppl (ONE TOUCH ULTRA 2) w/Device KIT Use as directed daily   cephALEXin (KEFLEX) 500 MG capsule Take 1 capsule (500 mg total) by mouth 3 (three) times daily.   citalopram (CELEXA) 10 MG tablet TAKE 1 TABLET BY MOUTH EVERY DAY   [employer] VITAMIN B12 1000 MCG tablet TAKE 1 TABLET BY MOUTH EVERY DAY   [EXPIRED] doxycycline (VIBRAMYCIN) 100 MG capsule Take 1 capsule (100 mg total) by mouth 2 (two) times daily for 7 days.   furosemide (LASIX) 40 MG tablet TAKE 1 TABLET BY MOUTH EVERY DAY  KLOR-CON M20 20 MEQ tablet TAKE 1 TABLET BY MOUTH EVERY DAY   Lancets (ONETOUCH DELICA PLUS LANCET33G) MISC USE AS DIRECTED TEST 1-2 TIMES A DAY   levothyroxine (SYNTHROID) 25 MCG tablet  TAKE 1 TABLET BY MOUTH EVERY DAY   losartan (COZAAR) 100 MG tablet TAKE 1 TABLET BY MOUTH EVERY DAY   losartan (COZAAR) 50 MG tablet TAKE 1 TABLET BY MOUTH EVERY DAY total dose 150 mg   metFORMIN (GLUCOPHAGE) 1000 MG tablet Take 1 tablet by mouth in the AM and 1/2 in the PM   metoprolol succinate (TOPROL-XL) 25 MG 24 hr tablet TAKE 1/2 TABLET BY MOUTH EVERY DAY   mirtazapine (REMERON) 15 MG tablet TAKE 1 TABLET BY MOUTH EVERYDAY AT BEDTIME   ONETOUCH ULTRA test strip USE AS INSTRUCTED TEST 1-2 TIMES A DAY   pantoprazole (PROTONIX) 40 MG tablet TAKE 1 TABLET BY MOUTH TWICE A DAY   solifenacin (VESICARE) 5 MG tablet Take 1 tablet (5 mg total) by mouth daily.    Allergies  Allergen Reactions   Ibuprofen Other (See Comments)    Bleeding events    SOCIAL HISTORY/FAMILY HISTORY   Reviewed in Epic:  Pertinent findings:  Social History   Tobacco Use   Smoking status: Never   Smokeless tobacco: Never  Vaping Use   Vaping Use: Never used  Substance Use Topics   Alcohol use: No    Alcohol/week: 0.0 standard drinks of alcohol   Drug use: No   Social History   Social History Narrative   Recently widowed--husband died in 2018/12/02.      Now lives alone but her son usually stays with her at night.  During the day she has 2 nephews who live nearby to come in and check on her intermittently.  Also one of her nieces calls routinely.      When her son is home and awake, she may try to walk on a treadmill, but is scared to walk outside.    OBJCTIVE -PE, EKG, labs   Wt Readings from Last 3 Encounters:  07/14/22 191 lb 6.4 oz (86.8 kg)  07/08/22 197 lb 15.6 oz (89.8 kg)  05/11/22 198 lb (89.8 kg)    Physical Exam: BP (!) 168/70 (BP Location: Left Arm, Patient Position: Sitting, Cuff Size: Large)   Pulse (!) 55   Ht 5\' 2"  (1.575 m)   Wt 191 lb 6.4 oz (86.8 kg)   SpO2 94%   BMI 35.01 kg/m  Physical Exam Vitals reviewed.  Constitutional:      General: She is not in acute  distress.    Appearance: Normal appearance. She is obese. She is not ill-appearing.  HENT:     Head: Normocephalic and atraumatic.  Neck:     Vascular: No carotid bruit (Radiated aortic murmur.).  Cardiovascular:     Rate and Rhythm: Regular rhythm. Bradycardia present.     Pulses: Normal pulses.     Heart sounds: Murmur (2/6 SEM at RUSB-neck) heard.     No friction rub. No gallop.  Pulmonary:     Effort: Pulmonary effort is normal. No respiratory distress.     Breath sounds: Normal breath sounds. No wheezing, rhonchi or rales.  Musculoskeletal:        General: Swelling (Trivial ankle) present. Normal range of motion.     Cervical back: Normal range of motion and neck supple.  Skin:    General: Skin is warm and dry.  Neurological:     General: No  focal deficit present.     Mental Status: She is alert and oriented to person, place, and time.     Gait: Gait abnormal.  Psychiatric:        Mood and Affect: Mood normal.        Behavior: Behavior normal.        Thought Content: Thought content normal.        Judgment: Judgment normal.     Adult ECG Report  Rate: 55 ;  Rhythm: sinus bradycardia and exclude anterior micrometer.  Otherwise normal axis Littles durations. ;   Narrative Interpretation: Stable  Recent Labs: Reviewed Lab Results  Component Value Date   CHOL 97 03/15/2022   HDL 35.40 (L) 03/15/2022   LDLCALC 46 03/15/2022   TRIG 81.0 03/15/2022   CHOLHDL 3 03/15/2022   Lab Results  Component Value Date   CREATININE 2.03 (H) 07/08/2022   BUN 28 (H) 07/08/2022   NA 135 07/08/2022   K 4.8 07/08/2022   CL 102 07/08/2022   CO2 21 (L) 07/08/2022      Latest Ref Rng & Units 07/08/2022   11:37 PM 03/15/2022   12:17 PM 01/02/2022    2:33 PM  CBC  WBC 4.0 - 10.5 K/uL 8.7  4.8  4.6   Hemoglobin 12.0 - 15.0 g/dL 13.0  86.5  78.4   Hematocrit 36.0 - 46.0 % 33.3  34.4  35.7   Platelets 150 - 400 K/uL 206  184.0  196     Lab Results  Component Value Date   HGBA1C 6.5  03/15/2022   Lab Results  Component Value Date   TSH 4.73 03/15/2022    ================================================== I spent a total of 26 minutes with the patient spent in direct patient consultation.  => She seemed a little bit down, but after discussion, she seemed to be just sad about not having independence anymore.  Seems otherwise content and happy.  Patient says but does her activities of daily living without too much difficulty. Additional time spent with chart review  / charting (studies, outside notes, etc): 15 min Total Time: 41 min  Current medicines are reviewed at length with the patient today.  (+/- concerns) N/A  Notice: This dictation was prepared with Dragon dictation along with smart phrase technology. Any transcriptional errors that result from this process are unintentional and may not be corrected upon review.  Studies Ordered:   Orders Placed This Encounter  Procedures   EKG 12-Lead   ECHOCARDIOGRAM COMPLETE   No orders of the defined types were placed in this encounter.   Patient Instructions / Medication Changes & Studies & Tests Ordered   Patient Instructions  Medication Instructions:  Your physician recommends that you continue on your current medications as directed. Please refer to the Current Medication list given to you today.  *If you need a refill on your cardiac medications before your next appointment, please call your pharmacy*   Lab Work: None   Testing/Procedures: Your physician has requested that you have an echocardiogram in October/November. Echocardiography is a painless test that uses sound waves to create images of your heart. It provides your doctor with information about the size and shape of your heart and how well your heart's chambers and valves are working. This procedure takes approximately one hour. There are no restrictions for this procedure. Please do NOT wear cologne, perfume, aftershave, or lotions (deodorant is  allowed). Please arrive 15 minutes prior to your appointment time.    Follow-Up:  At [facility], you and your health needs are our priority.  As part of our continuing mission to provide you with exceptional heart care, we have created designated Provider Care Teams.  These Care Teams include your primary Cardiologist (physician) and Advanced Practice Providers (APPs -  Physician Assistants and Nurse Practitioners) who all work together to provide you with the care you need, when you need it.   Your next appointment:   6 month(s)  Provider:   Bryan Lemma, MD      [name], MD, MS Bryan Lemma, M.D., M.S. Interventional Cardiologist  [facility]  Pager # [phone] Phone # [phone] [address] [place], [place] [address]   Thank you for choosing [facility] [place]!!

## 2022-07-14 NOTE — Patient Instructions (Signed)
Medication Instructions:  Your physician recommends that you continue on your current medications as directed. Please refer to the Current Medication list given to you today.  *If you need a refill on your cardiac medications before your next appointment, please call your pharmacy*   Lab Work: None   Testing/Procedures: Your physician has requested that you have an echocardiogram in October/November. Echocardiography is a painless test that uses sound waves to create images of your heart. It provides your doctor with information about the size and shape of your heart and how well your heart's chambers and valves are working. This procedure takes approximately one hour. There are no restrictions for this procedure. Please do NOT wear cologne, perfume, aftershave, or lotions (deodorant is allowed). Please arrive 15 minutes prior to your appointment time.    Follow-Up: At [facility], you and your health needs are our priority.  As part of our continuing mission to provide you with exceptional heart care, we have created designated Provider Care Teams.  These Care Teams include your primary Cardiologist (physician) and Advanced Practice Providers (APPs -  Physician Assistants and Nurse Practitioners) who all work together to provide you with the care you need, when you need it.   Your next appointment:   6 month(s)  Provider:   Bryan Lemma, MD

## 2022-07-31 ENCOUNTER — Encounter: Payer: Self-pay | Admitting: Cardiology

## 2022-07-31 NOTE — Assessment & Plan Note (Addendum)
Last echo looked good.  Due for follow-up echo October 2024.  Discussed SBE prophylaxis.

## 2022-07-31 NOTE — Assessment & Plan Note (Signed)
Minimal CAD on cath and coronary CTA.  No angina.  No aspirin because of anemia.  On beta-blocker, statin, amlodipine.  No change.

## 2022-07-31 NOTE — Assessment & Plan Note (Signed)
Outstanding lipid control as of February.  Continue atorvastatin.

## 2022-07-31 NOTE — Assessment & Plan Note (Signed)
Class I-II symptoms really more limited by physical disabilities as opposed to CHF.  She takes her 40 mg Lasix daily but has not taken any additional doses.  He is on low-dose Toprol plus high-dose losartan along with amlodipine 10 mg for elevated blood pressure. BP is high today. (We can consider switching from 150 of losartan to a more potent ARB if her BP continues to be elevated). I am leery of overtreating hypertension for fear of her tendency for orthostatic hypotension as well.

## 2022-07-31 NOTE — Assessment & Plan Note (Signed)
No further block.  Continue to monitor.  If patient comes more bradycardic and would stop Toprol.

## 2022-07-31 NOTE — Assessment & Plan Note (Signed)
Her blood pressure was up has been up and down.  I am leery of being overly aggressive.  If her pressures are still elevated I would recommend converting from losartan to a higher potency/new generation ARB and otherwise continue current meds.

## 2022-07-31 NOTE — Assessment & Plan Note (Signed)
As far she can tell, no breakthrough spells of A-fib.  Remains on amiodarone for rhythm and rate control.  Baseline bradycardia, but not too slow.  No more fatigue. CHA2DS2-VASc score is 8-9 but has bled score 3-4.  At this point we are avoiding anticoagulation due to recurrent GI bleed.  She does understand the risks versus benefits.  Plan: Continue amiodarone 100 mg daily and very low-dose beta-blocker which we can potentially stop if rate gets lower. No anticoagulation as noted.

## 2022-07-31 NOTE — Assessment & Plan Note (Signed)
Needs annual eye exam, thyroid levels and LFTs checked.   Would like for her to avoid blood draws.  When asked that PCP please check ESR and CRP along with PFTs and LFTs with routine follow-up labs.

## 2022-07-31 NOTE — Assessment & Plan Note (Signed)
See above.  No longer on anticoagulation because of high HAS BLED score and recurrent GI bleed

## 2022-08-15 ENCOUNTER — Other Ambulatory Visit (HOSPITAL_COMMUNITY): Payer: 59

## 2022-09-03 ENCOUNTER — Other Ambulatory Visit: Payer: Self-pay | Admitting: Internal Medicine

## 2022-09-03 ENCOUNTER — Other Ambulatory Visit: Payer: Self-pay | Admitting: Cardiology

## 2022-09-03 DIAGNOSIS — E785 Hyperlipidemia, unspecified: Secondary | ICD-10-CM

## 2022-09-03 DIAGNOSIS — I2510 Atherosclerotic heart disease of native coronary artery without angina pectoris: Secondary | ICD-10-CM

## 2022-09-13 ENCOUNTER — Other Ambulatory Visit: Payer: Self-pay | Admitting: Internal Medicine

## 2022-09-25 ENCOUNTER — Encounter: Payer: Self-pay | Admitting: Internal Medicine

## 2022-09-25 ENCOUNTER — Ambulatory Visit (INDEPENDENT_AMBULATORY_CARE_PROVIDER_SITE_OTHER): Payer: 59 | Admitting: Internal Medicine

## 2022-09-25 VITALS — BP 128/72 | HR 50 | Temp 98.4°F | Ht 62.0 in | Wt 200.0 lb

## 2022-09-25 DIAGNOSIS — Z7984 Long term (current) use of oral hypoglycemic drugs: Secondary | ICD-10-CM | POA: Diagnosis not present

## 2022-09-25 DIAGNOSIS — I10 Essential (primary) hypertension: Secondary | ICD-10-CM | POA: Diagnosis not present

## 2022-09-25 DIAGNOSIS — E1169 Type 2 diabetes mellitus with other specified complication: Secondary | ICD-10-CM

## 2022-09-25 DIAGNOSIS — E1165 Type 2 diabetes mellitus with hyperglycemia: Secondary | ICD-10-CM

## 2022-09-25 DIAGNOSIS — E538 Deficiency of other specified B group vitamins: Secondary | ICD-10-CM | POA: Diagnosis not present

## 2022-09-25 DIAGNOSIS — E559 Vitamin D deficiency, unspecified: Secondary | ICD-10-CM

## 2022-09-25 DIAGNOSIS — E039 Hypothyroidism, unspecified: Secondary | ICD-10-CM

## 2022-09-25 DIAGNOSIS — E785 Hyperlipidemia, unspecified: Secondary | ICD-10-CM

## 2022-09-25 DIAGNOSIS — F418 Other specified anxiety disorders: Secondary | ICD-10-CM

## 2022-09-25 LAB — CBC WITH DIFFERENTIAL/PLATELET
Basophils Absolute: 0 10*3/uL (ref 0.0–0.1)
Basophils Relative: 0.6 % (ref 0.0–3.0)
Eosinophils Absolute: 0.1 10*3/uL (ref 0.0–0.7)
Eosinophils Relative: 1.2 % (ref 0.0–5.0)
HCT: 29.3 % — ABNORMAL LOW (ref 36.0–46.0)
Hemoglobin: 9.5 g/dL — ABNORMAL LOW (ref 12.0–15.0)
Lymphocytes Relative: 21.8 % (ref 12.0–46.0)
Lymphs Abs: 1.2 10*3/uL (ref 0.7–4.0)
MCHC: 32.3 g/dL (ref 30.0–36.0)
MCV: 100.2 fl — ABNORMAL HIGH (ref 78.0–100.0)
Monocytes Absolute: 0.4 10*3/uL (ref 0.1–1.0)
Monocytes Relative: 7.6 % (ref 3.0–12.0)
Neutro Abs: 3.7 10*3/uL (ref 1.4–7.7)
Neutrophils Relative %: 68.8 % (ref 43.0–77.0)
Platelets: 210 10*3/uL (ref 150.0–400.0)
RBC: 2.93 Mil/uL — ABNORMAL LOW (ref 3.87–5.11)
RDW: 14 % (ref 11.5–15.5)
WBC: 5.3 10*3/uL (ref 4.0–10.5)

## 2022-09-25 LAB — HEMOGLOBIN A1C: Hgb A1c MFr Bld: 6 % (ref 4.6–6.5)

## 2022-09-25 LAB — VITAMIN B12: Vitamin B-12: 1067 pg/mL — ABNORMAL HIGH (ref 211–911)

## 2022-09-25 LAB — TSH: TSH: 5.01 u[IU]/mL (ref 0.35–5.50)

## 2022-09-25 LAB — VITAMIN D 25 HYDROXY (VIT D DEFICIENCY, FRACTURES): VITD: 12.4 ng/mL — ABNORMAL LOW (ref 30.00–100.00)

## 2022-09-25 MED ORDER — CITALOPRAM HYDROBROMIDE 20 MG PO TABS: 20.0000 mg | ORAL_TABLET | Freq: Every day | ORAL | 3 refills | Status: DC

## 2022-09-25 NOTE — Patient Instructions (Addendum)
Please call for your yearly eye exam  Ok to increase the celexa 20 mg per day  Please continue all other medications as before, and refills have been done if requested.  Please have the pharmacy call with any other refills you may need.  Please continue your efforts at being more active, low cholesterol diet, and weight control.  Please keep your appointments with your specialists as you may have planned  Please go to the LAB at the blood drawing area for the tests to be done  You will be contacted by phone if any changes need to be made immediately.  Otherwise, you will receive a letter about your results with an explanation, but please check with MyChart first.  Please make an Appointment to return in 6 months, or sooner if needed

## 2022-09-25 NOTE — Progress Notes (Unsigned)
Patient ID: Mary Mccarthy, female   DOB: 02/04/1941, 82 y.o.   MRN: [id number]        Chief Complaint: follow up HTN, HLD DM, low b12 and Vit D       HPI:  Mary Mccarthy is a 82 y.o. female here with mild increased worsening depressive symptoms, suicidal ideation, or panic; has ongoing anxiety, stable per pt.  CBGs have been in low 100s.  Has compression stockings per Dr Herbie Baltimore, but not yet wearing. No recent falls.  Denies urinary symptoms such as dysuria, frequency, urgency, flank pain, hematuria or n/v, fever, chills.  Seen in ED June 2024 after hear fall and slip in shower, fell backwards, with headache, should pain and knee pain, better after 3 days, did not see ortho as recommended.  Pt denies chest pain, increased sob or doe, wheezing, orthopnea, PND, increased LE swelling, palpitations, dizziness or syncope.  Pt denies polydipsia, polyuria, or new focal neuro s/s.    Pt denies fever, wt loss, night sweats, loss of appetite, or other constitutional symptoms  Due for f/u echo and cardioilogy in October 2024.    Wt Readings from Last 3 Encounters:  09/25/22 200 lb (90.7 kg)  07/14/22 191 lb 6.4 oz (86.8 kg)  07/08/22 197 lb 15.6 oz (89.8 kg)   BP Readings from Last 3 Encounters:  09/25/22 128/72  07/14/22 (!) 168/70  07/09/22 (!) 112/55         Past Medical History:  Diagnosis Date   ALLERGIC RHINITIS 06/24/2007   Anemia    AVM (arteriovenous malformation) of colon    Blood transfusion without reported diagnosis 05/2015   COLONIC POLYPS, HX OF 06/24/2007   Coronary artery disease, non-occlusive 05/2015   Trop + w/ Acute Anemia =>CATH: small RI - Ostial 60%, ostial RCA 30% and dLAD 40-50%;; 9/'21: Cor Ca Score 624. Mild (25-49%) prox RCA & LAD,; Moderate (50-69%) Ostial Small RI & prox LCx.     DEPRESSION 10/08/2006   DIABETES MELLITUS, TYPE II 10/05/2006   GERD 06/24/2007   History of CVA (cerebrovascular accident) 08/10/2020   08/2020- found to be in afib with RVR, started on  Eliquis (shower of emboli from A. fib as complication of severe AS)   HYPERLIPIDEMIA 10/08/2006   HYPERTENSION 10/08/2006   INSOMNIA 09/24/2008   Left knee DJD    Nonsustained paroxysmal ventricular tachycardia (HCC) 10/2018   Post-TAVR Zio Patch: Frequent (206) runs of NSVT - Fastest 5 beats 239 bpm. Longest 9.3 Sec avg 135 bpm. (? possibly Afib w/ aberrancy)   Osteoporosis 03/10/2016   PAF (paroxysmal atrial fibrillation) (HCC)    PAF with RVR: CHA2DS2-VASc 9.  On Eliquis and amiodarone. 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 Clifton Malayah Demuro & Dr. Laneta Simmers   Severe aortic stenosis 08/2020   Progression from Mod-Severe AS to Severe AS - noted on Echo 08/2020 -> (mean gradient progressed from 34 to 42 mmHg) this was in setting of new onset A. fib RVR, acute diastolic HF and shower of emboli CVA. ->  Referred for TAVR, completed 10/26/2020   Past Surgical History:  Procedure Laterality Date   APPENDECTOMY     BIOPSY  10/21/2020   Procedure: BIOPSY;  Surgeon: Imogene Burn, MD;  Location: [facility] ENDOSCOPY;  Service: Gastroenterology;;   BREAST BIOPSY     CARDIAC CATHETERIZATION N/A 05/21/2015  Procedure: Left Heart Cath and Coronary Angiography;  Surgeon: [name], MD;  Location: [facility] INVASIVE CV LAB;  Service: Cardiovascular;: Ost RI 60%, Ost RCA 30%, dLAD tapers to small vessel w/ 40-50%. Mildly elevated LVEDP. Normal LV Fxn.   COLONOSCOPY N/A 05/20/2015   Procedure: COLONOSCOPY;  Surgeon: Ruffin Frederick, MD;  Location: [facility] ENDOSCOPY;  Service: Gastroenterology;  Laterality: N/A;   CORONARY CA2+ SCORE / CARDIAC CT ANGIOGRAM  10/09/2019   Calcium score 624.  82nd percentile. Dominant RCA: Mild (25-49%) proximal stenosis-distal bifurcation into PDA and PAV--< RPL branches.  LAD (1 major mid vessel diagonal) diffuse calcified plaque, mild proximal  stenosis with minimal distal stenosis.  Small RI moderate ostial disease.  LCx-moderate mixed (50-69%) proximal stenosis.  Small dOM1 disease.  Trileaflet AoV, annular Ca2+ - probable AS   ESOPHAGOGASTRODUODENOSCOPY N/A 05/20/2015   Procedure: ESOPHAGOGASTRODUODENOSCOPY (EGD);  Surgeon: Ruffin Frederick, MD;  Location: [facility] ENDOSCOPY;  Service: Gastroenterology;  Laterality: N/A;   ESOPHAGOGASTRODUODENOSCOPY (EGD) WITH PROPOFOL N/A 10/21/2020   Procedure: ESOPHAGOGASTRODUODENOSCOPY (EGD) WITH PROPOFOL;  Surgeon: Imogene Burn, MD;  Location: [facility] ENDOSCOPY;  Service: Gastroenterology;  Laterality: N/A;   INTRAOPERATIVE TRANSTHORACIC ECHOCARDIOGRAM N/A 10/26/2020   Procedure: INTRAOPERATIVE TRANSTHORACIC ECHOCARDIOGRAM;  Surgeon: Clifton Anniemae Haberkorn; Location: [facility] OR; Pre-TAVR well-visualized calcified AoV mean Grad 37 mmHg, AVA 0.72 cm => Post TAVR well-positioned supra-annular 26 mm Medtronic Evolut Pro valve placed with no PVL.  Mean gradient 14 mmHg.  AVR 1.58 cm.  Normal flow to RCA and LM-LAD.  ; EF 60 to 65%.  Degenerative severe MAC w/ mild MR.   RIGHT/LEFT HEART CATH AND CORONARY ANGIOGRAPHY N/A 09/29/2020   Procedure: RIGHT/LEFT HEART CATH AND CORONARY ANGIOGRAPHY;  Surgeon: Kathleene Hazel, MD;  Location: [facility] INVASIVE CV LAB;  Service: Cardiovascular; pre-TAVR:  pRCA 20%, m-dRCA 30%. D LM-pLAD 20%. RI 30%, mLAD 20%.  Severe AS with mean gradient measured at 52.8 mmHg.   TONSILLECTOMY     TRANSCATHETER AORTIC VALVE REPLACEMENT, TRANSFEMORAL N/A 10/26/2020   Procedure: TRANSCATHETER AORTIC VALVE REPLACEMENT, TRANSFEMORAL;  Surgeon: Kathleene Hazel, MD;  Location: [facility] OR;  Service: Open Heart Surgery;; Medtronic Evolut-Pro + (size 26 mm, model # EVPROPLUS -26US, serial # [id number]); transient high-grade AV block noted so PPM left in place.   TRANSTHORACIC ECHOCARDIOGRAM  03/17/2019   a) 03/2019: EF 60 to 65%.  Moderate LVH.  GRII DD.  Mod-Severe AS (m grad 36 mmHg, peak 59 mmHg); b) 09/2019:  EF 65-70%, No RWMA. Gr1 DD/hi LAP, Mild hi PAP. Mod LA Dil. MOD AS (mean Grad 34.5 mmHg).  STABLE   TRANSTHORACIC ECHOCARDIOGRAM  08/27/2020   Admitted for CVA/Afib RVR & CHF (Progression to SEVERE SYMPTOMATIC AS):  Severe Calcific Aortic Stenosis (VTI AVA estimated 0.86 cm, mean gradient 42 mmHg, V-max 4.36 m/s).  EF 55 to 60%.  Severe concentric LVH.  Unable to determine diastolic parameters.  Moderately elevated PAP.  Mild LA dilation.  Mild circumferential pericardial effusion.  Trivial MR.  Severe MAC.   TRANSTHORACIC ECHOCARDIOGRAM  11/25/2020   1 Month s/p TAVR: EF 55 to 60%.  No R WMA.  GR 1 DD.  Elevated LVEDP.  Normal RV size with mildly elevated RVP estimated 44 mmHg.  Oscillating density in the RV suspect calcified chordal apparatus versus calcified thrombus.  Moderate LA dilation.  Normal IVC/RA P => Well-positioned 26 mm Medtronic Evolut Pro THVwith mean AOV gradient 11 mmHg.  Trivial PVL   TRANSTHORACIC ECHOCARDIOGRAM  11/02/2020   a) Day 1 Post-op TAVR  10/27/20:  Well-Positioned Supra Annular Medtronic Evolut Pro THP.  Mean gradient 12 mmHg, peak 24 mmHg.  AVA 1.8 cm.  Trivial PVL.  EF 60 to 65%.  Normal LV function.  Mild LVH.  Severe LA dilation.  Mild RA dilation.  Severe MAC;; b) 11/02/20: Normal structure and function of the aortic valve prosthesis.  Mean gradient 9 mmHg (otherwise stable)   TUBAL LIGATION      reports that she has never smoked. She has never used smokeless tobacco. She reports that she does not drink alcohol and does not use drugs. family history includes Alcohol abuse in an other family member; Arrhythmia in her sister; Arthritis in an other family member; CAD in her sister; Cancer in her mother; Depression in an other family member; Diabetes in an other family member; Heart attack (age of onset: 66) in her sister; Heart disease in an other family member; Hyperlipidemia in an other family member; Hypertension in an other family member; Stroke in an other family  member. Allergies  Allergen Reactions   Ibuprofen Other (See Comments)    Bleeding events   Current Outpatient Medications on File Prior to Visit  Medication Sig Dispense Refill   acetaminophen (TYLENOL) 500 MG tablet Take 500 mg by mouth every 6 (six) hours as needed for moderate pain.     ALPRAZolam (XANAX) 0.25 MG tablet TAKE 1 TABLET BY MOUTH THREE TIMES A DAY AS NEEDED FOR ANXIETY 60 tablet 2   amiodarone (PACERONE) 200 MG tablet TAKE 1 TABLET BY MOUTH EVERY DAY 90 tablet 3   amLODipine (NORVASC) 10 MG tablet Take 1 tablet (10 mg total) by mouth daily. 90 tablet 3   atorvastatin (LIPITOR) 40 MG tablet TAKE 1 TABLET BY MOUTH EVERY DAY 90 tablet 3   blood glucose meter kit and supplies Dispense based on patient and insurance preference. Use up to four times daily as directed. E11.9 1 each 0   Blood Glucose Monitoring Suppl (ONE TOUCH ULTRA 2) w/Device KIT Use as directed daily 1 each 0   [facility] VITAMIN B12 1000 MCG tablet TAKE 1 TABLET BY MOUTH EVERY DAY 90 tablet 3   furosemide (LASIX) 40 MG tablet TAKE 1 TABLET BY MOUTH EVERY DAY 90 tablet 3   KLOR-CON M20 20 MEQ tablet TAKE 1 TABLET BY MOUTH EVERY DAY 90 tablet 3   Lancets (ONETOUCH DELICA PLUS LANCET33G) MISC USE AS DIRECTED TEST 1-2 TIMES A DAY 100 each 23   levothyroxine (SYNTHROID) 25 MCG tablet TAKE 1 TABLET BY MOUTH EVERY DAY 90 tablet 2   losartan (COZAAR) 100 MG tablet TAKE 1 TABLET BY MOUTH EVERY DAY 90 tablet 3   metFORMIN (GLUCOPHAGE) 1000 MG tablet Take 1 tablet by mouth in the AM and 1/2 in the PM 135 tablet 1   metoprolol succinate (TOPROL-XL) 25 MG 24 hr tablet TAKE 1/2 TABLET BY MOUTH EVERY DAY 45 tablet 3   mirtazapine (REMERON) 15 MG tablet TAKE 1 TABLET BY MOUTH EVERYDAY AT BEDTIME 90 tablet 3   ONETOUCH ULTRA test strip USE AS INSTRUCTED TEST 1-2 TIMES A DAY 100 strip 23   pantoprazole (PROTONIX) 40 MG tablet TAKE 1 TABLET BY MOUTH TWICE A DAY 180 tablet 2   solifenacin (VESICARE) 5 MG tablet Take 1 tablet (5 mg  total) by mouth daily. 90 tablet 3   No current facility-administered medications on file prior to visit.        ROS:  All others reviewed and negative.  Objective  PE:  BP 128/72 (BP Location: Right Arm, Patient Position: Sitting, Cuff Size: Normal)   Pulse (!) 50   Temp 98.4 F (36.9 C) (Oral)   Ht 5\' 2"  (1.575 m)   Wt 200 lb (90.7 kg)   SpO2 93%   BMI 36.58 kg/m                 Constitutional: Pt appears in NAD               HENT: Head: NCAT.                Right Ear: External ear normal.                 Left Ear: External ear normal.                Eyes: . Pupils are equal, round, and reactive to light. Conjunctivae and EOM are normal               Nose: without d/c or deformity               Neck: Neck supple. Gross normal ROM               Cardiovascular: Normal rate and regular rhythm.                 Pulmonary/Chest: Effort normal and breath sounds without rales or wheezing.                Abd:  Soft, NT, ND, + BS, no organomegaly               Neurological: Pt is alert. At baseline orientation, motor grossly intact               Skin: Skin is warm. No rashes, no other new lesions, LE edema -none                Psychiatric: Pt behavior is normal without agitation , depressed affect  Micro: none  Cardiac tracings I have personally interpreted today:  none  Pertinent Radiological findings (summarize): none   Lab Results  Component Value Date   WBC 5.3 09/25/2022   HGB 9.5 (L) 09/25/2022   HCT 29.3 (L) 09/25/2022   PLT 210.0 09/25/2022   GLUCOSE 125 (H) 09/25/2022   CHOL 83 09/25/2022   TRIG 65.0 09/25/2022   HDL 30.00 (L) 09/25/2022   LDLCALC 40 09/25/2022   ALT 12 09/25/2022   AST 15 09/25/2022   NA 140 09/25/2022   K 4.3 09/25/2022   CL 107 09/25/2022   CREATININE 1.35 (H) 09/25/2022   BUN 21 09/25/2022   CO2 26 09/25/2022   TSH 5.01 09/25/2022   INR 1.7 (H) 11/01/2020   HGBA1C 6.0 09/25/2022   MICROALBUR 1.3 03/15/2022   Assessment/Plan:   Mary Mccarthy is a 82 y.o. White or Caucasian [1] female with  has a past medical history of ALLERGIC RHINITIS (06/24/2007), Anemia, AVM (arteriovenous malformation) of colon, Blood transfusion without reported diagnosis (05/2015), COLONIC POLYPS, HX OF (06/24/2007), Coronary artery disease, non-occlusive (05/2015), DEPRESSION (10/08/2006), DIABETES MELLITUS, TYPE II (10/05/2006), GERD (06/24/2007), History of CVA (cerebrovascular accident) (08/10/2020), HYPERLIPIDEMIA (10/08/2006), HYPERTENSION (10/08/2006), INSOMNIA (09/24/2008), Left knee DJD, Nonsustained paroxysmal ventricular tachycardia (HCC) (10/2018), Osteoporosis (03/10/2016), PAF (paroxysmal atrial fibrillation) (HCC), PAF with RVR: CHA2DS2-VASc 9.  On Eliquis and amiodarone. (08/11/2020), PEPTIC ULCER DISEASE (10/08/2006), S/P TAVR (transcatheter aortic valve replacement) (10/26/2020), and Severe aortic stenosis (08/2020).  Anxiety with depression With  mild recent worsening, for increased celexa 20 qd  B12 deficiency Lab Results  Component Value Date   VITAMINB12 1,067 (H) 09/25/2022   Stable, cont oral replacement - b12 1000 mcg qd   Essential hypertension BP Readings from Last 3 Encounters:  09/25/22 128/72  07/14/22 (!) 168/70  07/09/22 (!) 112/55   Stable, pt to continue medical treatment norvasc 10 every day, toprol xl 25 - 1/2 qd   Hyperlipidemia associated with type 2 diabetes mellitus (HCC) Lab Results  Component Value Date   LDLCALC 40 09/25/2022   Stable, pt to continue current statin lipitor 40 qd   Hypothyroidism Lab Results  Component Value Date   TSH 5.01 09/25/2022   Stable, pt to continue levothyroxine 25 mcg qd   Type 2 diabetes mellitus with hyperglycemia, without long-term current use of insulin (HCC) Lab Results  Component Value Date   HGBA1C 6.0 09/25/2022   Stable, pt to continue current medical treatment metformin 1000 am, 500 pm   Vitamin D deficiency Last vitamin D Lab Results   Component Value Date   VD25OH 12.40 (L) 09/25/2022   Low, to start oral replacement  Followup: Return in about 6 months (around 03/28/2023).  [name], MD 09/27/2022 9:13 PM [facility]  Primary Care - [facility] Internal Medicine

## 2022-09-26 ENCOUNTER — Encounter: Payer: Self-pay | Admitting: Internal Medicine

## 2022-09-26 LAB — BASIC METABOLIC PANEL
BUN: 21 mg/dL (ref 6–23)
CO2: 26 mEq/L (ref 19–32)
Calcium: 9.7 mg/dL (ref 8.4–10.5)
Chloride: 107 meq/L (ref 96–112)
Creatinine, Ser: 1.35 mg/dL — ABNORMAL HIGH (ref 0.40–1.20)
GFR: 36.22 mL/min — ABNORMAL LOW (ref >60.00)
Glucose, Bld: 125 mg/dL — ABNORMAL HIGH (ref 70–99)
Potassium: 4.3 meq/L (ref 3.5–5.1)
Sodium: 140 meq/L (ref 135–145)

## 2022-09-26 LAB — LIPID PANEL
Cholesterol: 83 mg/dL (ref 0–200)
HDL: 30 mg/dL — ABNORMAL LOW (ref >39.00)
LDL Cholesterol: 40 mg/dL (ref 0–99)
NonHDL: 53.19
Total CHOL/HDL Ratio: 3
Triglycerides: 65 mg/dL (ref 0.0–149.0)
VLDL: 13 mg/dL (ref 0.0–40.0)

## 2022-09-26 LAB — HEPATIC FUNCTION PANEL
ALT: 12 U/L (ref 0–35)
AST: 15 U/L (ref 0–37)
Albumin: 3.5 g/dL (ref 3.5–5.2)
Alkaline Phosphatase: 54 U/L (ref 39–117)
Bilirubin, Direct: 0.3 mg/dL (ref 0.0–0.3)
Total Bilirubin: 1.3 mg/dL — ABNORMAL HIGH (ref 0.2–1.2)
Total Protein: 7 g/dL (ref 6.0–8.3)

## 2022-09-27 ENCOUNTER — Encounter: Payer: Self-pay | Admitting: Internal Medicine

## 2022-09-27 NOTE — Assessment & Plan Note (Signed)
Lab Results  Component Value Date   HGBA1C 6.0 09/25/2022   Stable, pt to continue current medical treatment metformin 1000 am, 500 pm

## 2022-09-27 NOTE — Assessment & Plan Note (Signed)
Lab Results  Component Value Date   LDLCALC 40 09/25/2022   Stable, pt to continue current statin lipitor 40 qd

## 2022-09-27 NOTE — Assessment & Plan Note (Signed)
With mild recent worsening, for increased celexa 20 qd

## 2022-09-27 NOTE — Assessment & Plan Note (Signed)
Lab Results  Component Value Date   TSH 5.01 09/25/2022   Stable, pt to continue levothyroxine 25 mcg qd

## 2022-09-27 NOTE — Assessment & Plan Note (Signed)
BP Readings from Last 3 Encounters:  09/25/22 128/72  07/14/22 (!) 168/70  07/09/22 (!) 112/55   Stable, pt to continue medical treatment norvasc 10 every day, toprol xl 25 - 1/2 qd

## 2022-09-27 NOTE — Assessment & Plan Note (Signed)
Last vitamin D Lab Results  Component Value Date   VD25OH 12.40 (L) 09/25/2022   Low, to start oral replacement

## 2022-09-27 NOTE — Assessment & Plan Note (Signed)
Lab Results  Component Value Date   VITAMINB12 1,067 (H) 09/25/2022   Stable, cont oral replacement - b12 1000 mcg qd

## 2022-10-14 ENCOUNTER — Other Ambulatory Visit: Payer: Self-pay | Admitting: Physician Assistant

## 2022-10-14 ENCOUNTER — Other Ambulatory Visit: Payer: Self-pay | Admitting: Internal Medicine

## 2022-10-16 ENCOUNTER — Other Ambulatory Visit: Payer: Self-pay

## 2022-10-28 ENCOUNTER — Other Ambulatory Visit: Payer: Self-pay | Admitting: Internal Medicine

## 2022-10-30 ENCOUNTER — Other Ambulatory Visit: Payer: Self-pay

## 2022-11-13 ENCOUNTER — Ambulatory Visit (HOSPITAL_COMMUNITY): Payer: 59 | Attending: Cardiology

## 2022-11-13 DIAGNOSIS — Z952 Presence of prosthetic heart valve: Secondary | ICD-10-CM | POA: Diagnosis not present

## 2022-11-13 LAB — ECHOCARDIOGRAM COMPLETE
AV Mean grad: 9 mm[Hg]
AV Peak grad: 20 mm[Hg]
Ao pk vel: 2.24 m/s
Area-P 1/2: 3.3 cm2
S' Lateral: 2.3 cm

## 2022-11-24 ENCOUNTER — Other Ambulatory Visit: Payer: Self-pay | Admitting: Internal Medicine

## 2022-12-07 ENCOUNTER — Other Ambulatory Visit: Payer: Self-pay | Admitting: Physician Assistant

## 2023-01-09 ENCOUNTER — Other Ambulatory Visit: Payer: Self-pay | Admitting: Internal Medicine

## 2023-01-15 ENCOUNTER — Ambulatory Visit: Payer: 59 | Admitting: Cardiology

## 2023-01-23 ENCOUNTER — Telehealth: Payer: Self-pay | Admitting: Internal Medicine

## 2023-01-23 NOTE — Telephone Encounter (Signed)
Pt should enough refills at the pharmacy until march.

## 2023-01-23 NOTE — Telephone Encounter (Signed)
Patient states that she can not find her furosemide that was last filled.  Can this be sent in again.  [facility] to [facility] on [address].  Please call patient and let her know when this has been done.  Phone:  [phone]

## 2023-02-06 ENCOUNTER — Other Ambulatory Visit: Payer: Self-pay | Admitting: Cardiovascular Disease

## 2023-02-06 ENCOUNTER — Other Ambulatory Visit: Payer: Self-pay | Admitting: Cardiology

## 2023-02-28 ENCOUNTER — Other Ambulatory Visit: Payer: Self-pay

## 2023-02-28 ENCOUNTER — Ambulatory Visit: Payer: Self-pay | Admitting: Internal Medicine

## 2023-02-28 ENCOUNTER — Emergency Department (HOSPITAL_COMMUNITY): Payer: 59

## 2023-02-28 ENCOUNTER — Encounter (HOSPITAL_COMMUNITY): Payer: Self-pay | Admitting: Emergency Medicine

## 2023-02-28 ENCOUNTER — Inpatient Hospital Stay (HOSPITAL_COMMUNITY)
Admission: EM | Admit: 2023-02-28 | Discharge: 2023-03-03 | DRG: 291 | Disposition: A | Discharge status: Home / Self Care | Payer: 59 | Attending: Internal Medicine | Admitting: Internal Medicine

## 2023-02-28 DIAGNOSIS — Z818 Family history of other mental and behavioral disorders: Secondary | ICD-10-CM

## 2023-02-28 DIAGNOSIS — F419 Anxiety disorder, unspecified: Secondary | ICD-10-CM | POA: Diagnosis present

## 2023-02-28 DIAGNOSIS — T501X6A Underdosing of loop [high-ceiling] diuretics, initial encounter: Secondary | ICD-10-CM | POA: Diagnosis present

## 2023-02-28 DIAGNOSIS — N1832 Chronic kidney disease, stage 3b: Secondary | ICD-10-CM | POA: Diagnosis present

## 2023-02-28 DIAGNOSIS — Z833 Family history of diabetes mellitus: Secondary | ICD-10-CM

## 2023-02-28 DIAGNOSIS — J9601 Acute respiratory failure with hypoxia: Secondary | ICD-10-CM | POA: Diagnosis not present

## 2023-02-28 DIAGNOSIS — N179 Acute kidney failure, unspecified: Secondary | ICD-10-CM | POA: Diagnosis not present

## 2023-02-28 DIAGNOSIS — Z952 Presence of prosthetic heart valve: Secondary | ICD-10-CM

## 2023-02-28 DIAGNOSIS — K2100 Gastro-esophageal reflux disease with esophagitis, without bleeding: Secondary | ICD-10-CM | POA: Diagnosis not present

## 2023-02-28 DIAGNOSIS — I5033 Acute on chronic diastolic (congestive) heart failure: Secondary | ICD-10-CM | POA: Diagnosis not present

## 2023-02-28 DIAGNOSIS — Z91119 Patient's noncompliance with dietary regimen due to unspecified reason: Secondary | ICD-10-CM

## 2023-02-28 DIAGNOSIS — D631 Anemia in chronic kidney disease: Secondary | ICD-10-CM | POA: Diagnosis present

## 2023-02-28 DIAGNOSIS — E875 Hyperkalemia: Secondary | ICD-10-CM | POA: Diagnosis not present

## 2023-02-28 DIAGNOSIS — Z953 Presence of xenogenic heart valve: Secondary | ICD-10-CM

## 2023-02-28 DIAGNOSIS — Z951 Presence of aortocoronary bypass graft: Secondary | ICD-10-CM

## 2023-02-28 DIAGNOSIS — J811 Chronic pulmonary edema: Secondary | ICD-10-CM | POA: Diagnosis not present

## 2023-02-28 DIAGNOSIS — E1122 Type 2 diabetes mellitus with diabetic chronic kidney disease: Secondary | ICD-10-CM | POA: Diagnosis not present

## 2023-02-28 DIAGNOSIS — Z860100 Personal history of colon polyps, unspecified: Secondary | ICD-10-CM

## 2023-02-28 DIAGNOSIS — Z66 Do not resuscitate: Secondary | ICD-10-CM | POA: Diagnosis present

## 2023-02-28 DIAGNOSIS — I700 Atherosclerosis of aorta: Secondary | ICD-10-CM | POA: Diagnosis present

## 2023-02-28 DIAGNOSIS — E871 Hypo-osmolality and hyponatremia: Secondary | ICD-10-CM | POA: Diagnosis not present

## 2023-02-28 DIAGNOSIS — Z6836 Body mass index (BMI) 36.0-36.9, adult: Secondary | ICD-10-CM

## 2023-02-28 DIAGNOSIS — Z8673 Personal history of transient ischemic attack (TIA), and cerebral infarction without residual deficits: Secondary | ICD-10-CM

## 2023-02-28 DIAGNOSIS — R0602 Shortness of breath: Secondary | ICD-10-CM | POA: Diagnosis not present

## 2023-02-28 DIAGNOSIS — E039 Hypothyroidism, unspecified: Secondary | ICD-10-CM | POA: Diagnosis present

## 2023-02-28 DIAGNOSIS — Z823 Family history of stroke: Secondary | ICD-10-CM

## 2023-02-28 DIAGNOSIS — R0902 Hypoxemia: Secondary | ICD-10-CM

## 2023-02-28 DIAGNOSIS — Z8249 Family history of ischemic heart disease and other diseases of the circulatory system: Secondary | ICD-10-CM | POA: Diagnosis not present

## 2023-02-28 DIAGNOSIS — Y92009 Unspecified place in unspecified non-institutional (private) residence as the place of occurrence of the external cause: Secondary | ICD-10-CM | POA: Diagnosis not present

## 2023-02-28 DIAGNOSIS — Z1152 Encounter for screening for COVID-19: Secondary | ICD-10-CM | POA: Diagnosis not present

## 2023-02-28 DIAGNOSIS — Z7984 Long term (current) use of oral hypoglycemic drugs: Secondary | ICD-10-CM | POA: Diagnosis not present

## 2023-02-28 DIAGNOSIS — I2510 Atherosclerotic heart disease of native coronary artery without angina pectoris: Secondary | ICD-10-CM | POA: Diagnosis not present

## 2023-02-28 DIAGNOSIS — Z886 Allergy status to analgesic agent status: Secondary | ICD-10-CM

## 2023-02-28 DIAGNOSIS — I272 Pulmonary hypertension, unspecified: Secondary | ICD-10-CM | POA: Diagnosis present

## 2023-02-28 DIAGNOSIS — N183 Chronic kidney disease, stage 3 unspecified: Secondary | ICD-10-CM | POA: Diagnosis present

## 2023-02-28 DIAGNOSIS — Z79899 Other long term (current) drug therapy: Secondary | ICD-10-CM

## 2023-02-28 DIAGNOSIS — J9610 Chronic respiratory failure, unspecified whether with hypoxia or hypercapnia: Secondary | ICD-10-CM | POA: Diagnosis not present

## 2023-02-28 DIAGNOSIS — I480 Paroxysmal atrial fibrillation: Secondary | ICD-10-CM | POA: Diagnosis present

## 2023-02-28 DIAGNOSIS — Z79890 Hormone replacement therapy (postmenopausal): Secondary | ICD-10-CM | POA: Diagnosis not present

## 2023-02-28 DIAGNOSIS — I130 Hypertensive heart and chronic kidney disease with heart failure and stage 1 through stage 4 chronic kidney disease, or unspecified chronic kidney disease: Secondary | ICD-10-CM | POA: Diagnosis not present

## 2023-02-28 DIAGNOSIS — M810 Age-related osteoporosis without current pathological fracture: Secondary | ICD-10-CM | POA: Diagnosis present

## 2023-02-28 DIAGNOSIS — E785 Hyperlipidemia, unspecified: Secondary | ICD-10-CM | POA: Diagnosis not present

## 2023-02-28 DIAGNOSIS — I509 Heart failure, unspecified: Secondary | ICD-10-CM | POA: Diagnosis not present

## 2023-02-28 DIAGNOSIS — K219 Gastro-esophageal reflux disease without esophagitis: Secondary | ICD-10-CM | POA: Diagnosis present

## 2023-02-28 DIAGNOSIS — R001 Bradycardia, unspecified: Secondary | ICD-10-CM | POA: Diagnosis present

## 2023-02-28 DIAGNOSIS — Z91128 Patient's intentional underdosing of medication regimen for other reason: Secondary | ICD-10-CM

## 2023-02-28 DIAGNOSIS — E1169 Type 2 diabetes mellitus with other specified complication: Secondary | ICD-10-CM | POA: Diagnosis present

## 2023-02-28 DIAGNOSIS — E66812 Obesity, class 2: Secondary | ICD-10-CM | POA: Diagnosis present

## 2023-02-28 DIAGNOSIS — I2489 Other forms of acute ischemic heart disease: Secondary | ICD-10-CM | POA: Diagnosis present

## 2023-02-28 DIAGNOSIS — Z8711 Personal history of peptic ulcer disease: Secondary | ICD-10-CM

## 2023-02-28 DIAGNOSIS — I110 Hypertensive heart disease with heart failure: Secondary | ICD-10-CM | POA: Diagnosis not present

## 2023-02-28 DIAGNOSIS — I4811 Longstanding persistent atrial fibrillation: Secondary | ICD-10-CM | POA: Diagnosis present

## 2023-02-28 LAB — COMPREHENSIVE METABOLIC PANEL
ALT: 12 U/L (ref 0–44)
AST: 22 U/L (ref 15–41)
Albumin: 3 g/dL — ABNORMAL LOW (ref 3.5–5.0)
Alkaline Phosphatase: 56 U/L (ref 38–126)
Anion gap: 4 — ABNORMAL LOW (ref 5–15)
BUN: 16 mg/dL (ref 8–23)
CO2: 22 mmol/L (ref 22–32)
Calcium: 9.5 mg/dL (ref 8.9–10.3)
Chloride: 111 mmol/L (ref 98–111)
Creatinine, Ser: 1.4 mg/dL — ABNORMAL HIGH (ref 0.44–1.00)
GFR, Estimated: 37 mL/min — ABNORMAL LOW (ref >60)
Glucose, Bld: 122 mg/dL — ABNORMAL HIGH (ref 70–99)
Potassium: 5.9 mmol/L — ABNORMAL HIGH (ref 3.5–5.1)
Sodium: 137 mmol/L (ref 135–145)
Total Bilirubin: 1.6 mg/dL — ABNORMAL HIGH (ref 0.0–1.2)
Total Protein: 6.9 g/dL (ref 6.5–8.1)

## 2023-02-28 LAB — CBC
HCT: 31.3 % — ABNORMAL LOW (ref 36.0–46.0)
Hemoglobin: 9.5 g/dL — ABNORMAL LOW (ref 12.0–15.0)
MCH: 29.6 pg (ref 26.0–34.0)
MCHC: 30.4 g/dL (ref 30.0–36.0)
MCV: 97.5 fL (ref 80.0–100.0)
Platelets: 252 10*3/uL (ref 150–400)
RBC: 3.21 MIL/uL — ABNORMAL LOW (ref 3.87–5.11)
RDW: 15.7 % — ABNORMAL HIGH (ref 11.5–15.5)
WBC: 5.5 10*3/uL (ref 4.0–10.5)
nRBC: 0 % (ref 0.0–0.2)

## 2023-02-28 LAB — CBC WITH DIFFERENTIAL/PLATELET
Abs Immature Granulocytes: 0.02 10*3/uL (ref 0.00–0.07)
Basophils Absolute: 0 10*3/uL (ref 0.0–0.1)
Basophils Relative: 0 %
Eosinophils Absolute: 0 10*3/uL (ref 0.0–0.5)
Eosinophils Relative: 1 %
HCT: 32 % — ABNORMAL LOW (ref 36.0–46.0)
Hemoglobin: 9.7 g/dL — ABNORMAL LOW (ref 12.0–15.0)
Immature Granulocytes: 0 %
Lymphocytes Relative: 12 %
Lymphs Abs: 0.6 10*3/uL — ABNORMAL LOW (ref 0.7–4.0)
MCH: 29.9 pg (ref 26.0–34.0)
MCHC: 30.3 g/dL (ref 30.0–36.0)
MCV: 98.8 fL (ref 80.0–100.0)
Monocytes Absolute: 0.4 10*3/uL (ref 0.1–1.0)
Monocytes Relative: 8 %
Neutro Abs: 4.2 10*3/uL (ref 1.7–7.7)
Neutrophils Relative %: 79 %
Platelets: 250 10*3/uL (ref 150–400)
RBC: 3.24 MIL/uL — ABNORMAL LOW (ref 3.87–5.11)
RDW: 15.7 % — ABNORMAL HIGH (ref 11.5–15.5)
WBC: 5.4 10*3/uL (ref 4.0–10.5)
nRBC: 0 % (ref 0.0–0.2)

## 2023-02-28 LAB — BASIC METABOLIC PANEL
Anion gap: 7 (ref 5–15)
BUN: 15 mg/dL (ref 8–23)
CO2: 22 mmol/L (ref 22–32)
Calcium: 9.7 mg/dL (ref 8.9–10.3)
Chloride: 107 mmol/L (ref 98–111)
Creatinine, Ser: 1.44 mg/dL — ABNORMAL HIGH (ref 0.44–1.00)
GFR, Estimated: 36 mL/min — ABNORMAL LOW (ref >60)
Glucose, Bld: 123 mg/dL — ABNORMAL HIGH (ref 70–99)
Potassium: 5 mmol/L (ref 3.5–5.1)
Sodium: 136 mmol/L (ref 135–145)

## 2023-02-28 LAB — CREATININE, SERUM
Creatinine, Ser: 1.52 mg/dL — ABNORMAL HIGH (ref 0.44–1.00)
GFR, Estimated: 34 mL/min — ABNORMAL LOW (ref >60)

## 2023-02-28 LAB — RESP PANEL BY RT-PCR (RSV, FLU A&B, COVID)  RVPGX2
Influenza A by PCR: NEGATIVE
Influenza B by PCR: NEGATIVE
Resp Syncytial Virus by PCR: NEGATIVE
SARS Coronavirus 2 by RT PCR: NEGATIVE

## 2023-02-28 LAB — GLUCOSE, CAPILLARY
Glucose-Capillary: 109 mg/dL — ABNORMAL HIGH (ref 70–99)
Glucose-Capillary: 145 mg/dL — ABNORMAL HIGH (ref 70–99)

## 2023-02-28 LAB — TROPONIN I (HIGH SENSITIVITY)
Troponin I (High Sensitivity): 20 ng/L — ABNORMAL HIGH (ref <18)
Troponin I (High Sensitivity): 20 ng/L — ABNORMAL HIGH (ref <18)

## 2023-02-28 LAB — BRAIN NATRIURETIC PEPTIDE: B Natriuretic Peptide: 594.1 pg/mL — ABNORMAL HIGH (ref 0.0–100.0)

## 2023-02-28 MED ORDER — AMIODARONE HCL 200 MG PO TABS
200.0000 mg | ORAL_TABLET | Freq: Every day | ORAL | Status: DC
  Administered 2023-02-28 – 2023-03-03 (×4): 200 mg via ORAL
  Filled 2023-02-28 (×4): qty 1

## 2023-02-28 MED ORDER — AMLODIPINE BESYLATE 10 MG PO TABS
10.0000 mg | ORAL_TABLET | Freq: Every day | ORAL | Status: DC
  Administered 2023-02-28 – 2023-03-01 (×2): 10 mg via ORAL
  Filled 2023-02-28 (×2): qty 1

## 2023-02-28 MED ORDER — INSULIN ASPART 100 UNIT/ML IJ SOLN
0.0000 [IU] | Freq: Three times a day (TID) | INTRAMUSCULAR | Status: DC
  Administered 2023-03-02 – 2023-03-03 (×3): 2 [IU] via SUBCUTANEOUS

## 2023-02-28 MED ORDER — ACETAMINOPHEN 325 MG PO TABS
650.0000 mg | ORAL_TABLET | Freq: Four times a day (QID) | ORAL | Status: DC | PRN
  Administered 2023-02-28: 650 mg via ORAL
  Filled 2023-02-28: qty 2

## 2023-02-28 MED ORDER — ONDANSETRON HCL 4 MG PO TABS: 4.0000 mg | ORAL_TABLET | Freq: Four times a day (QID) | ORAL | Status: DC | PRN

## 2023-02-28 MED ORDER — SODIUM CHLORIDE 0.9% FLUSH
3.0000 mL | Freq: Two times a day (BID) | INTRAVENOUS | Status: DC
  Administered 2023-03-01 – 2023-03-03 (×4): 3 mL via INTRAVENOUS

## 2023-02-28 MED ORDER — LEVOTHYROXINE SODIUM 25 MCG PO TABS
25.0000 ug | ORAL_TABLET | Freq: Every day | ORAL | Status: DC
Start: 2023-03-01 — End: 2023-03-03
  Administered 2023-03-01 – 2023-03-03 (×3): 25 ug via ORAL
  Filled 2023-02-28 (×3): qty 1

## 2023-02-28 MED ORDER — CITALOPRAM HYDROBROMIDE 10 MG PO TABS
10.0000 mg | ORAL_TABLET | Freq: Every day | ORAL | Status: DC
  Administered 2023-03-01 – 2023-03-03 (×3): 10 mg via ORAL
  Filled 2023-02-28 (×4): qty 1

## 2023-02-28 MED ORDER — HYDROCODONE-ACETAMINOPHEN 5-325 MG PO TABS
1.0000 | ORAL_TABLET | ORAL | Status: DC | PRN
Start: 2023-02-28 — End: 2023-03-03

## 2023-02-28 MED ORDER — ONDANSETRON HCL 4 MG/2ML IJ SOLN: 4.0000 mg | Freq: Four times a day (QID) | INTRAMUSCULAR | Status: DC | PRN

## 2023-02-28 MED ORDER — LOSARTAN POTASSIUM 50 MG PO TABS: 100.0000 mg | ORAL_TABLET | Freq: Once | ORAL | Status: DC

## 2023-02-28 MED ORDER — HEPARIN SODIUM (PORCINE) 5000 UNIT/ML IJ SOLN
5000.0000 [IU] | Freq: Three times a day (TID) | INTRAMUSCULAR | Status: DC
  Administered 2023-02-28 – 2023-03-03 (×9): 5000 [IU] via SUBCUTANEOUS
  Filled 2023-02-28 (×9): qty 1

## 2023-02-28 MED ORDER — FUROSEMIDE 10 MG/ML IJ SOLN
40.0000 mg | Freq: Once | INTRAMUSCULAR | Status: AC
Start: 2023-02-28 — End: 2023-02-28
  Administered 2023-02-28: 40 mg via INTRAVENOUS
  Filled 2023-02-28: qty 4

## 2023-02-28 MED ORDER — NITROGLYCERIN 2 % TD OINT: 1.0000 [in_us] | TOPICAL_OINTMENT | Freq: Once | TRANSDERMAL | Status: DC

## 2023-02-28 MED ORDER — ACETAMINOPHEN 650 MG RE SUPP: 650.0000 mg | Freq: Four times a day (QID) | RECTAL | Status: DC | PRN

## 2023-02-28 MED ORDER — POLYETHYLENE GLYCOL 3350 17 G PO PACK: 17.0000 g | PACK | Freq: Every day | ORAL | Status: DC | PRN

## 2023-02-28 MED ORDER — ATORVASTATIN CALCIUM 40 MG PO TABS
40.0000 mg | ORAL_TABLET | Freq: Every day | ORAL | Status: DC
  Administered 2023-02-28 – 2023-03-03 (×4): 40 mg via ORAL
  Filled 2023-02-28 (×4): qty 1

## 2023-02-28 MED ORDER — ALPRAZOLAM 0.25 MG PO TABS
0.2500 mg | ORAL_TABLET | Freq: Three times a day (TID) | ORAL | Status: DC | PRN
  Administered 2023-03-01 – 2023-03-02 (×2): 0.25 mg via ORAL
  Filled 2023-02-28 (×2): qty 1

## 2023-02-28 MED ORDER — FUROSEMIDE 10 MG/ML IJ SOLN
40.0000 mg | Freq: Every day | INTRAMUSCULAR | Status: DC
  Administered 2023-03-01: 40 mg via INTRAVENOUS
  Filled 2023-02-28: qty 4

## 2023-02-28 NOTE — H&P (Signed)
History and Physical    Mary Mccarthy AOZ:[id number] DOB: [birth year] DOA: 02/28/2023  PCP: Corwin Levins, MD  Patient coming from: Home  Chief Complaint: SOB  HPI: Mary Mccarthy is a 83 y.o. female with medical history significant of paroxysmal A-fib, TIA/CVA, no longer on DOAC secondary to recurrent GI bleed, severe aortic stenosis status post TAVR complicated by transient complete heart block, hypertension, hyperlipidemia, CAD who presents with SOB.  She states that around Christmas, she started having URI symptoms.  She thought she had the flu.  She has only been taking her Lasix intermittently on as-needed basis for fluid overload.  Son at bedside states that over the past several weeks, they have been eating more takeout.  She denies any fevers, does admit to productive cough of yellow/green sputum, no chest pain or abdominal pain.  Does admit to some vomiting.  Admits to bilateral edema.  ED Course: Labs obtained which revealed CKD, hyperkalemia.  Chest x-ray consistent with vascular congestion.  Patient was given IV Lasix.  Review of Systems: As per HPI. Otherwise, all other review of systems reviewed and are negative.   Past Medical History:  Diagnosis Date   ALLERGIC RHINITIS 06/24/2007   Anemia    AVM (arteriovenous malformation) of colon    Blood transfusion without reported diagnosis 05/2015   COLONIC POLYPS, HX OF 06/24/2007   Coronary artery disease, non-occlusive 05/2015   Trop + w/ Acute Anemia =>CATH: small RI - Ostial 60%, ostial RCA 30% and dLAD 40-50%;; 9/'21: Cor Ca Score 624. Mild (25-49%) prox RCA & LAD,; Moderate (50-69%) Ostial Small RI & prox LCx.     DEPRESSION 10/08/2006   DIABETES MELLITUS, TYPE II 10/05/2006   GERD 06/24/2007   History of CVA (cerebrovascular accident) 08/10/2020   08/2020- found to be in afib with RVR, started on Eliquis (shower of emboli from A. fib as complication of severe AS)   HYPERLIPIDEMIA 10/08/2006   HYPERTENSION 10/08/2006    INSOMNIA 09/24/2008   Left knee DJD    Nonsustained paroxysmal ventricular tachycardia (HCC) 10/2018   Post-TAVR Zio Patch: Frequent (206) runs of NSVT - Fastest 5 beats 239 bpm. Longest 9.3 Sec avg 135 bpm. (? possibly Afib w/ aberrancy)   Osteoporosis 03/10/2016   PAF (paroxysmal atrial fibrillation) (HCC)    PAF with RVR: CHA2DS2-VASc 9.  On Eliquis and amiodarone. 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 Clifton James & Dr. Laneta Simmers   Severe aortic stenosis 08/2020   Progression from Mod-Severe AS to Severe AS - noted on Echo 08/2020 -> (mean gradient progressed from 34 to 42 mmHg) this was in setting of new onset A. fib RVR, acute diastolic HF and shower of emboli CVA. ->  Referred for TAVR, completed 10/26/2020    Past Surgical History:  Procedure Laterality Date   APPENDECTOMY     BIOPSY  10/21/2020   Procedure: BIOPSY;  Surgeon: Imogene Burn, MD;  Location: [facility] ENDOSCOPY;  Service: Gastroenterology;;   BREAST BIOPSY     CARDIAC CATHETERIZATION N/A 05/21/2015   Procedure: Left Heart Cath and Coronary Angiography;  Surgeon: [name], MD;  Location: [facility] INVASIVE CV LAB;  Service: Cardiovascular;: Ost RI 60%, Ost RCA 30%, dLAD tapers to small vessel w/ 40-50%. Mildly elevated LVEDP. Normal LV Fxn.   COLONOSCOPY N/A 05/20/2015   Procedure: COLONOSCOPY;  Surgeon: Reeves Forth  Adela Lank, MD;  Location: [facility] ENDOSCOPY;  Service: Gastroenterology;  Laterality: N/A;   CORONARY CA2+ SCORE / CARDIAC CT ANGIOGRAM  10/09/2019   Calcium score 624.  82nd percentile. Dominant RCA: Mild (25-49%) proximal stenosis-distal bifurcation into PDA and PAV--< RPL branches.  LAD (1 major mid vessel diagonal) diffuse calcified plaque, mild proximal stenosis with minimal distal stenosis.  Small RI moderate ostial disease.  LCx-moderate mixed (50-69%) proximal stenosis.  Small  dOM1 disease.  Trileaflet AoV, annular Ca2+ - probable AS   ESOPHAGOGASTRODUODENOSCOPY N/A 05/20/2015   Procedure: ESOPHAGOGASTRODUODENOSCOPY (EGD);  Surgeon: Ruffin Frederick, MD;  Location: [facility] ENDOSCOPY;  Service: Gastroenterology;  Laterality: N/A;   ESOPHAGOGASTRODUODENOSCOPY (EGD) WITH PROPOFOL N/A 10/21/2020   Procedure: ESOPHAGOGASTRODUODENOSCOPY (EGD) WITH PROPOFOL;  Surgeon: Imogene Burn, MD;  Location: [facility] ENDOSCOPY;  Service: Gastroenterology;  Laterality: N/A;   INTRAOPERATIVE TRANSTHORACIC ECHOCARDIOGRAM N/A 10/26/2020   Procedure: INTRAOPERATIVE TRANSTHORACIC ECHOCARDIOGRAM;  Surgeon: Clifton James; Location: [facility] OR; Pre-TAVR well-visualized calcified AoV mean Grad 37 mmHg, AVA 0.72 cm => Post TAVR well-positioned supra-annular 26 mm Medtronic Evolut Pro valve placed with no PVL.  Mean gradient 14 mmHg.  AVR 1.58 cm.  Normal flow to RCA and LM-LAD.  ; EF 60 to 65%.  Degenerative severe MAC w/ mild MR.   RIGHT/LEFT HEART CATH AND CORONARY ANGIOGRAPHY N/A 09/29/2020   Procedure: RIGHT/LEFT HEART CATH AND CORONARY ANGIOGRAPHY;  Surgeon: Kathleene Hazel, MD;  Location: [facility] INVASIVE CV LAB;  Service: Cardiovascular; pre-TAVR:  pRCA 20%, m-dRCA 30%. D LM-pLAD 20%. RI 30%, mLAD 20%.  Severe AS with mean gradient measured at 52.8 mmHg.   TONSILLECTOMY     TRANSCATHETER AORTIC VALVE REPLACEMENT, TRANSFEMORAL N/A 10/26/2020   Procedure: TRANSCATHETER AORTIC VALVE REPLACEMENT, TRANSFEMORAL;  Surgeon: Kathleene Hazel, MD;  Location: [facility] OR;  Service: Open Heart Surgery;; Medtronic Evolut-Pro + (size 26 mm, model # EVPROPLUS -26US, serial # [id number]); transient high-grade AV block noted so PPM left in place.   TRANSTHORACIC ECHOCARDIOGRAM  03/17/2019   a) 03/2019: EF 60 to 65%.  Moderate LVH.  GRII DD.  Mod-Severe AS (m grad 36 mmHg, peak 59 mmHg); b) 09/2019: EF 65-70%, No RWMA. Gr1 DD/hi LAP, Mild hi PAP. Mod LA Dil. MOD AS (mean Grad 34.5 mmHg).  STABLE   TRANSTHORACIC ECHOCARDIOGRAM   08/27/2020   Admitted for CVA/Afib RVR & CHF (Progression to SEVERE SYMPTOMATIC AS):  Severe Calcific Aortic Stenosis (VTI AVA estimated 0.86 cm, mean gradient 42 mmHg, V-max 4.36 m/s).  EF 55 to 60%.  Severe concentric LVH.  Unable to determine diastolic parameters.  Moderately elevated PAP.  Mild LA dilation.  Mild circumferential pericardial effusion.  Trivial MR.  Severe MAC.   TRANSTHORACIC ECHOCARDIOGRAM  11/25/2020   1 Month s/p TAVR: EF 55 to 60%.  No R WMA.  GR 1 DD.  Elevated LVEDP.  Normal RV size with mildly elevated RVP estimated 44 mmHg.  Oscillating density in the RV suspect calcified chordal apparatus versus calcified thrombus.  Moderate LA dilation.  Normal IVC/RA P => Well-positioned 26 mm Medtronic Evolut Pro THVwith mean AOV gradient 11 mmHg.  Trivial PVL   TRANSTHORACIC ECHOCARDIOGRAM  11/02/2020   a) Day 1 Post-op TAVR 10/27/20:  Well-Positioned Supra Annular Medtronic Evolut Pro THP.  Mean gradient 12 mmHg, peak 24 mmHg.  AVA 1.8 cm.  Trivial PVL.  EF 60 to 65%.  Normal LV function.  Mild LVH.  Severe LA dilation.  Mild RA dilation.  Severe MAC;; b) 11/02/20: Normal structure and function of  the aortic valve prosthesis.  Mean gradient 9 mmHg (otherwise stable)   TUBAL LIGATION       reports that she has never smoked. She has never used smokeless tobacco. She reports that she does not drink alcohol and does not use drugs.  Allergies  Allergen Reactions   Ibuprofen Other (See Comments)    Bleeding events    Family History  Problem Relation Age of Onset   Alcohol abuse Other    Arthritis Other        DJD   Hyperlipidemia Other    Heart disease Other    Stroke Other    Hypertension Other    Depression Other    Diabetes Other    Cancer Mother        ENT cancer   CAD Sister        Several MI & PPM; long term smoker, EtOH   Heart attack Sister 20       several   Arrhythmia Sister        s/p PPM (Dr. Royann Shivers)    Prior to Admission medications   Medication Sig  Start Date End Date Taking? Authorizing Provider  acetaminophen (TYLENOL) 500 MG tablet Take 500 mg by mouth every 6 (six) hours as needed for moderate pain.    [provider]  ALPRAZolam Prudy Feeler) 0.25 MG tablet TAKE 1 TABLET BY MOUTH THREE TIMES A DAY AS NEEDED FOR ANXIETY 09/13/22   Corwin Levins, MD  amiodarone (PACERONE) 200 MG tablet TAKE 1 TABLET BY MOUTH EVERY DAY 02/06/23   Tonny Bollman, MD  amLODipine (NORVASC) 10 MG tablet TAKE 1 TABLET BY MOUTH EVERY DAY 02/06/23   [name], MD  atorvastatin (LIPITOR) 40 MG tablet TAKE 1 TABLET BY MOUTH EVERY DAY 09/04/22   [name], MD  blood glucose meter kit and supplies Dispense based on patient and insurance preference. Use up to four times daily as directed. E11.9 04/15/18   Corwin Levins, MD  Blood Glucose Monitoring Suppl (ONE TOUCH ULTRA 2) w/Device KIT Use as directed daily 06/01/15   Corwin Levins, MD  citalopram (CELEXA) 20 MG tablet Take 1 tablet (20 mg total) by mouth daily. 09/25/22   Corwin Levins, MD  [employer] VITAMIN B12 1000 MCG tablet TAKE 1 TABLET BY MOUTH EVERY DAY 02/13/22   Corwin Levins, MD  furosemide (LASIX) 40 MG tablet TAKE 1 TABLET BY MOUTH EVERY DAY 04/25/22   Corwin Levins, MD  KLOR-CON M20 20 MEQ tablet TAKE 1 TABLET BY MOUTH EVERY DAY 03/24/22   [name], MD  Lancets [facility] DELICA PLUS [place]) MISC USE AS DIRECTED TEST 1-2 TIMES A DAY 09/05/20   Corwin Levins, MD  levothyroxine (SYNTHROID) 25 MCG tablet TAKE 1 TABLET BY MOUTH EVERY DAY 02/13/22   Corwin Levins, MD  losartan (COZAAR) 100 MG tablet TAKE 1 TABLET BY MOUTH EVERY DAY 05/08/22   Corwin Levins, MD  metFORMIN (GLUCOPHAGE) 1000 MG tablet TAKE 1 TABLET BY MOUTH IN THE AM AND 1/2 IN THE PM 10/16/22   Corwin Levins, MD  metoprolol succinate (TOPROL-XL) 25 MG 24 hr tablet TAKE 1/2 TABLET BY MOUTH EVERY DAY 05/08/22   Corwin Levins, MD  mirtazapine (REMERON) 15 MG tablet TAKE 1 TABLET BY MOUTH EVERYDAY AT BEDTIME 10/30/22   Corwin Levins, MD  [facility]  ULTRA test strip USE AS INSTRUCTED TEST 1-2 TIMES A DAY 10/12/20   Corwin Levins, MD  pantoprazole (PROTONIX) 40 MG tablet TAKE 1 TABLET BY MOUTH TWICE A DAY 12/07/22   Janetta Hora, PA-C  solifenacin (VESICARE) 5 MG tablet Take 1 tablet (5 mg total) by mouth daily. 03/27/22   Corwin Levins, MD    Physical Exam: Vitals:   02/28/23 1515 02/28/23 1530 02/28/23 1600 02/28/23 1630  BP: (!) 185/70 (!) 159/137 (!) 179/53 (!) 176/50  Pulse: 61 (!) 54 (!) 58 (!) 58  Resp: 17 (!) 29 (!) 27 16  Temp:      TempSrc:      SpO2: 90% 90% 90% 90%  Weight:      Height:         Constitutional: NAD, calm, comfortable Eyes: PERRL, lids and conjunctivae normal ENMT: Mucous membranes are moist. Normal dentition.  Respiratory: Clear to auscultation bilaterally, no wheezing, no crackles. Normal respiratory effort. No accessory muscle use. No conversational dyspnea.  On nasal cannula O2 Cardiovascular: Bradycardic rate and regular rhythm, no murmurs.  Bilateral pitting extremity edema.  Abdomen: Soft, nondistended, nontender to palpation. Bowel sounds positive.  Musculoskeletal: No joint deformity upper and lower extremities. No contractures. Normal muscle tone.  Skin: no rashes, lesions, ulcers on exposed skin  Neurologic: Alert and oriented, speech fluent, CN 2-12 grossly intact. No focal deficits.   Psychiatric: Normal judgment and insight. Normal mood and affect   Labs on Admission: I have personally reviewed following labs and imaging studies  CBC: Recent Labs  Lab 02/28/23 1331  WBC 5.4  NEUTROABS 4.2  HGB 9.7*  HCT 32.0*  MCV 98.8  PLT 250   Basic Metabolic Panel: Recent Labs  Lab 02/28/23 1331  NA 137  K 5.9*  CL 111  CO2 22  GLUCOSE 122*  BUN 16  CREATININE 1.40*  CALCIUM 9.5   GFR: Estimated Creatinine Clearance: 29.8 mL/min (A) (by C-G formula based on SCr of 1.4 mg/dL (H)). Liver Function Tests: Recent Labs  Lab 02/28/23 1331  AST 22  ALT 12  ALKPHOS 56   BILITOT 1.6*  PROT 6.9  ALBUMIN 3.0*   No results for input(s): "LIPASE", "AMYLASE" in the last 168 hours. No results for input(s): "AMMONIA" in the last 168 hours. Coagulation Profile: No results for input(s): "INR", "PROTIME" in the last 168 hours. Cardiac Enzymes: No results for input(s): "CKTOTAL", "CKMB", "CKMBINDEX", "TROPONINI" in the last 168 hours. BNP (last 3 results) No results for input(s): "PROBNP" in the last 8760 hours. HbA1C: No results for input(s): "HGBA1C" in the last 72 hours. CBG: No results for input(s): "GLUCAP" in the last 168 hours. Lipid Profile: No results for input(s): "CHOL", "HDL", "LDLCALC", "TRIG", "CHOLHDL", "LDLDIRECT" in the last 72 hours. Thyroid Function Tests: No results for input(s): "TSH", "T4TOTAL", "FREET4", "T3FREE", "THYROIDAB" in the last 72 hours. Anemia Panel: No results for input(s): "VITAMINB12", "FOLATE", "FERRITIN", "TIBC", "IRON", "RETICCTPCT" in the last 72 hours. Urine analysis:    Component Value Date/Time   COLORURINE YELLOW 07/08/2022 2344   APPEARANCEUR CLEAR 07/08/2022 2344   LABSPEC 1.009 07/08/2022 2344   PHURINE 5.0 07/08/2022 2344   GLUCOSEU NEGATIVE 07/08/2022 2344   GLUCOSEU NEGATIVE 03/15/2022 1217   HGBUR NEGATIVE 07/08/2022 2344   BILIRUBINUR NEGATIVE 07/08/2022 2344   KETONESUR NEGATIVE 07/08/2022 2344   PROTEINUR NEGATIVE 07/08/2022 2344   UROBILINOGEN 0.2 03/15/2022 1217   NITRITE NEGATIVE 07/08/2022 2344   LEUKOCYTESUR NEGATIVE 07/08/2022 2344   Sepsis Labs: !!!!!!!!!!!!!!!!!!!!!!!!!!!!!!!!!!!!!!!!!!!! @LABRCNTIP (procalcitonin:4,lacticidven:4) ) Recent Results (from the past 240 hours)  Resp panel by RT-PCR (RSV, Flu A&B,  Covid) Anterior Nasal Swab     Status: None   Collection Time: 02/28/23  1:20 PM   Specimen: Anterior Nasal Swab  Result Value Ref Range Status   SARS Coronavirus 2 by RT PCR NEGATIVE NEGATIVE Final   Influenza A by PCR NEGATIVE NEGATIVE Final   Influenza B by PCR NEGATIVE  NEGATIVE Final    Comment: (NOTE) The Xpert Xpress SARS-CoV-2/FLU/RSV plus assay is intended as an aid in the diagnosis of influenza from Nasopharyngeal swab specimens and should not be used as a sole basis for treatment. Nasal washings and aspirates are unacceptable for Xpert Xpress SARS-CoV-2/FLU/RSV testing.  Fact Sheet for Patients: BloggerCourse.com  Fact Sheet for Healthcare Providers: SeriousBroker.it  This test is not yet approved or cleared by the [place] FDA and has been authorized for detection and/or diagnosis of SARS-CoV-2 by FDA under an Emergency Use Authorization (EUA). This EUA will remain in effect (meaning this test can be used) for the duration of the COVID-19 declaration under Section 564(b)(1) of the Act, 21 U.S.C. section 360bbb-3(b)(1), unless the authorization is terminated or revoked.     Resp Syncytial Virus by PCR NEGATIVE NEGATIVE Final    Comment: (NOTE) Fact Sheet for Patients: BloggerCourse.com  Fact Sheet for Healthcare Providers: SeriousBroker.it  This test is not yet approved or cleared by the [place] FDA and has been authorized for detection and/or diagnosis of SARS-CoV-2 by FDA under an Emergency Use Authorization (EUA). This EUA will remain in effect (meaning this test can be used) for the duration of the COVID-19 declaration under Section 564(b)(1) of the Act, 21 U.S.C. section 360bbb-3(b)(1), unless the authorization is terminated or revoked.  Performed at [facility], [address] [place], [place] [address]      Radiological Exams on Admission: DG Chest Portable 1 View Result Date: 02/28/2023 CLINICAL DATA:  Shortness of breath and hypoxia.  Influenza. EXAM: PORTABLE CHEST 1 VIEW COMPARISON:  Chest radiograph dated 07/09/2022. FINDINGS: There is cardiomegaly with vascular congestion and edema. Pneumonia is not  excluded. Small bilateral pleural effusions. No pneumothorax. Aortic valve repair. No acute osseous pathology. IMPRESSION: Cardiomegaly with vascular congestion and edema. Electronically Signed   By: Elgie Collard M.D.   On: 02/28/2023 14:47    EKG: Independently reviewed.  Sinus bradycardia with heart rate 55  Assessment/Plan Principal Problem:   Acute on chronic diastolic CHF (congestive heart failure) (HCC) Active Problems:   History of CVA (cerebrovascular accident)   Hyperlipidemia associated with type 2 diabetes mellitus (HCC)   GERD   Demand ischemia of myocardium (HCC)   Anxiety   Aortic atherosclerosis (HCC)   PAF with RVR: CHA2DS2-VASc 9.  On amiodarone.   S/P TAVR (transcatheter aortic valve replacement)   CKD (chronic kidney disease), stage III (HCC)   Type 2 diabetes mellitus with obesity (HCC)   Hypothyroidism   Acute on chronic diastolic heart failure -Follow [facility] Dr. Bryan Lemma outpatient -Echocardiogram November 13, 2022 revealed EF 60 to 65%, grade 2 diastolic dysfunction -BNP 594.1 -Has not been taking her home Lasix, noncompliant with diet -IV Lasix -Strict I's and O's, daily weight, fluid restriction diet  Hyperkalemia -Hold Cozaar.  Has been taking her Klor-Con without Lasix -IV Lasix -Repeat BMP  Acute hypoxic respiratory failure -Secondary to CHF exacerbation -SpO2 85% on room air, requiring 2 L nasal cannula O2 - wean as tolerated  -COVID, influenza, RSV negative  Demand ischemia -Secondary to above -Troponin 20, trend  CKD stage IIIb -Baseline creatinine 1.35 -  Hold Cozaar due to hyperkalemia  History of paroxysmal A-fib, complete heart block -Holding anticoagulation due to recurrent GI bleed per recent cardiology note -Hold Toprol due to bradycardia has history of transient complete heart block -Amiodarone  Hyperlipidemia -Lipitor  Hypertension -Hold Cozaar (hyperkalemia), Toprol (bradycardia) -Norvasc  CAD -No aspirin due to  anemia, recurrent GI bleed  Status post TAVR  Hypothyroidism -Synthroid  Diabetes mellitus, well-controlled -Hold metformin -Sliding scale insulin -Check A1c    DVT prophylaxis: Subcutaneous heparin  Code Status: DNR, confirmed with patient at time of admission  Family Communication: Son at bedside  Disposition Plan: Home Consults called: None    Severity of Illness: The appropriate patient status for this patient is INPATIENT. Inpatient status is judged to be reasonable and necessary in order to provide the required intensity of service to ensure the patient's safety. The patient's presenting symptoms, physical exam findings, and initial radiographic and laboratory data in the context of their chronic comorbidities is felt to place them at high risk for further clinical deterioration. Furthermore, it is not anticipated that the patient will be medically stable for discharge from the hospital within 2 midnights of admission.   * I certify that at the point of admission it is my clinical judgment that the patient will require inpatient hospital care spanning beyond 2 midnights from the point of admission due to high intensity of service, high risk for further deterioration and high frequency of surveillance required.Noralee Stain, DO [facility] 02/28/2023, 5:27 PM   Available via Epic secure chat 7am-7pm After these hours, please refer to coverage provider listed on [email or web address]

## 2023-02-28 NOTE — ED Triage Notes (Addendum)
Pt via POV c/o SOB and flu symptoms since before Christmas, worse over the past week. Reports n/v/d, cough, fever tmax 169f, chills, dyspnea with exertion. Pt has hx CHF. O2 sat 85% on room air; placed on 2L oxygen in triage.

## 2023-02-28 NOTE — Telephone Encounter (Signed)
Copied from CRM [id number]. Topic: Clinical - Red Word Triage >> Feb 28, 2023  9:56 AM Denese Killings wrote: Red Word that prompted transfer to Nurse Triage: Patient has been sick with the Flu and cold since Christmas. Patient is experiencing shortness of breath.   Chief Complaint: shortness of breath Symptoms: chest congestion, greenish yellow sputum Frequency: ongoing for 3-4 days Pertinent Negatives: Patient denies cough Disposition: [x] ED /[] Urgent Care (no appt availability in office) / [] Appointment(In office/virtual)/ []  [facility]/ [] Home Care/ [] Refused Recommended Disposition /[] [facility]/ []  Follow-up with PCP Additional Notes: The patient reported worsening intermittent shortness of breath at rest for the past 3-4 days.  It has been worse at night.  She reported that it is constant now.  She has a history of CHF.  She reported that she has had upper respiratory symptoms since Christmas and she has not been treated.  She reported chest congestion and she is able to cough up greenish yellow sputum sometimes.  She was advised to go to the ED now for further assessment.  She was agreeable.   Reason for Disposition  [1] MODERATE difficulty breathing (e.g., speaks in phrases, SOB even at rest, pulse 100-120) AND [2] NEW-onset or WORSE than normal  Answer Assessment - Initial Assessment Questions 1. RESPIRATORY STATUS: "Describe your breathing?" (e.g., wheezing, shortness of breath, unable to speak, severe coughing)      Shortness of breath  2. ONSET: "When did this breathing problem begin?"      3-4 days  3. PATTERN "Does the difficult breathing come and go, or has it been constant since it started?"      Comes and goes but today it is not improving  4. SEVERITY: "How bad is your breathing?" (e.g., mild, moderate, severe)    - MILD: No SOB at rest, mild SOB with walking, speaks normally in sentences, can lie down, no retractions, pulse < 100.    - MODERATE: SOB at  rest, SOB with minimal exertion and prefers to sit, cannot lie down flat, speaks in phrases, mild retractions, audible wheezing, pulse 100-120.    - SEVERE: Very SOB at rest, speaks in single words, struggling to breathe, sitting hunched forward, retractions, pulse > 120      Sitting or laying down especially during the night 5. RECURRENT SYMPTOM: "Have you had difficulty breathing before?" If Yes, ask: "When was the last time?" and "What happened that time?"      Several ago after having the flu  6. CARDIAC HISTORY: "Do you have any history of heart disease?" (e.g., heart attack, angina, bypass surgery, angioplasty)      CHF Previous heart surgery 3-4 years ago  7. LUNG HISTORY: "Do you have any history of lung disease?"  (e.g., pulmonary embolus, asthma, emphysema)     None  8. CAUSE: "What do you think is causing the breathing problem?"      Upper respiratory symptoms  9. OTHER SYMPTOMS: "Do you have any other symptoms? (e.g., dizziness, runny nose, cough, chest pain, fever)     Sick since Christmas off and on  Not treated  Phlegm in throat and chest - sometimes able to cough it up greenish yellow  10. O2 SATURATION MONITOR:  "Do you use an oxygen saturation monitor (pulse oximeter) at home?" If Yes, ask: "What is your reading (oxygen level) today?" "What is your usual oxygen saturation reading?" (e.g., 95%)       Does not own pulse ox  Protocols used:  Breathing Difficulty-A-AH

## 2023-02-28 NOTE — ED Provider Notes (Signed)
[place] EMERGENCY DEPARTMENT AT [facility] Provider Note   CSN: [id number] Arrival date & time: 02/28/23  1205     History  Chief Complaint  Patient presents with   Shortness of Breath   Influenza    Mary Mccarthy is a 83 y.o. female.  HPI Patient reports exertional shortness of breath for about the past week.  She also reports getting quite short of breath lying in bed at night.  She reports having to sit up and then feeling like it takes several minutes to catch her breath again.  She reports during those episodes she is too short of breath to get up and walk around.  She reports symptoms started several weeks ago around Christmas time when she thought she had a cold.  She was having some nasal congestion and coughing.  Patient denies she has ever had a fever that she is aware of.  Reports she has had some chills and sweats off and on.  Patient takes Lasix intermittently based on her observation of lower extremity swelling.  She reports she has not been taking any Lasix over the past week or so.  She did not feel that she was having extremity swelling and thus has not been taking any.  She reports she is getting some intermittent chest discomfort.  She does not qualify it as being very severe but notes sometimes a left-sided chest pain that radiates through to her back and then resolved spontaneously.  Patient reports that the outside of the illness she was having some vomiting and diarrhea.  She has continued to have occasional vomiting.  Is been about every other day and sometimes occurs after eating.  This has not been persistent.  Patient denies abdominal pain in association with this.    Home Medications Prior to Admission medications   Medication Sig Start Date End Date Taking? Authorizing Provider  acetaminophen (TYLENOL) 500 MG tablet Take 500 mg by mouth every 6 (six) hours as needed for moderate pain.    [provider]  ALPRAZolam Prudy Feeler) 0.25 MG  tablet TAKE 1 TABLET BY MOUTH THREE TIMES A DAY AS NEEDED FOR ANXIETY 09/13/22   Corwin Levins, MD  amiodarone (PACERONE) 200 MG tablet TAKE 1 TABLET BY MOUTH EVERY DAY 02/06/23   Tonny Bollman, MD  amLODipine (NORVASC) 10 MG tablet TAKE 1 TABLET BY MOUTH EVERY DAY 02/06/23   [name], MD  atorvastatin (LIPITOR) 40 MG tablet TAKE 1 TABLET BY MOUTH EVERY DAY 09/04/22   [name], MD  blood glucose meter kit and supplies Dispense based on patient and insurance preference. Use up to four times daily as directed. E11.9 04/15/18   Corwin Levins, MD  Blood Glucose Monitoring Suppl (ONE TOUCH ULTRA 2) w/Device KIT Use as directed daily 06/01/15   Corwin Levins, MD  citalopram (CELEXA) 20 MG tablet Take 1 tablet (20 mg total) by mouth daily. 09/25/22   Corwin Levins, MD  [employer] VITAMIN B12 1000 MCG tablet TAKE 1 TABLET BY MOUTH EVERY DAY 02/13/22   Corwin Levins, MD  furosemide (LASIX) 40 MG tablet TAKE 1 TABLET BY MOUTH EVERY DAY 04/25/22   Corwin Levins, MD  KLOR-CON M20 20 MEQ tablet TAKE 1 TABLET BY MOUTH EVERY DAY 03/24/22   [name], MD  Lancets [facility] DELICA PLUS [place]) MISC USE AS DIRECTED TEST 1-2 TIMES A DAY 09/05/20   Corwin Levins, MD  levothyroxine (SYNTHROID) 25 MCG tablet TAKE  1 TABLET BY MOUTH EVERY DAY 02/13/22   Corwin Levins, MD  losartan (COZAAR) 100 MG tablet TAKE 1 TABLET BY MOUTH EVERY DAY 05/08/22   Corwin Levins, MD  metFORMIN (GLUCOPHAGE) 1000 MG tablet TAKE 1 TABLET BY MOUTH IN THE AM AND 1/2 IN THE PM 10/16/22   Corwin Levins, MD  metoprolol succinate (TOPROL-XL) 25 MG 24 hr tablet TAKE 1/2 TABLET BY MOUTH EVERY DAY 05/08/22   Corwin Levins, MD  mirtazapine (REMERON) 15 MG tablet TAKE 1 TABLET BY MOUTH EVERYDAY AT BEDTIME 10/30/22   Corwin Levins, MD  [facility] ULTRA test strip USE AS INSTRUCTED TEST 1-2 TIMES A DAY 10/12/20   Corwin Levins, MD  pantoprazole (PROTONIX) 40 MG tablet TAKE 1 TABLET BY MOUTH TWICE A DAY 12/07/22   Janetta Hora, PA-C  solifenacin  (VESICARE) 5 MG tablet Take 1 tablet (5 mg total) by mouth daily. 03/27/22   Corwin Levins, MD      Allergies    Ibuprofen    Review of Systems   Review of Systems  Physical Exam Updated Vital Signs BP (!) 179/53   Pulse (!) 58   Temp 98.3 F (36.8 C) (Oral)   Resp (!) 27   Ht 5\' 1"  (1.549 m)   Wt 86.2 kg   SpO2 90%   BMI 35.90 kg/m  Physical Exam Constitutional:      Comments: Alert.  No acute distress.  Mental status clear.  HENT:     Mouth/Throat:     Pharynx: Oropharynx is clear.  Eyes:     Extraocular Movements: Extraocular movements intact.  Cardiovascular:     Rate and Rhythm: Normal rate and regular rhythm.  Pulmonary:     Comments: Breath sounds are symmetric.  Breath sounds soft at the bases.  No pronounced crackle or wheeze. Abdominal:     General: There is no distension.     Palpations: Abdomen is soft.     Tenderness: There is no abdominal tenderness. There is no guarding.  Musculoskeletal:     Comments: Bilateral lower extremities are symmetric.  She has 2+ pitting edema bilaterally.  Feet are warm and dry.  Skin condition is good of the feet and lower legs.  Skin:    General: Skin is warm and dry.  Neurological:     General: No focal deficit present.     Mental Status: She is oriented to person, place, and time.     Comments: No focal neurologic dysfunction.  Alert and speaking in full sentences appropriately.  Psychiatric:        Mood and Affect: Mood normal.     ED Results / Procedures / Treatments   Labs (all labs ordered are listed, but only abnormal results are displayed) Labs Reviewed  CBC WITH DIFFERENTIAL/PLATELET - Abnormal; Notable for the following components:      Result Value   RBC 3.24 (*)    Hemoglobin 9.7 (*)    HCT 32.0 (*)    RDW 15.7 (*)    Lymphs Abs 0.6 (*)    All other components within normal limits  COMPREHENSIVE METABOLIC PANEL - Abnormal; Notable for the following components:   Potassium 5.9 (*)    Glucose, Bld  122 (*)    Creatinine, Ser 1.40 (*)    Albumin 3.0 (*)    Total Bilirubin 1.6 (*)    GFR, Estimated 37 (*)    Anion gap 4 (*)    All other  components within normal limits  BRAIN NATRIURETIC PEPTIDE - Abnormal; Notable for the following components:   B Natriuretic Peptide 594.1 (*)    All other components within normal limits  TROPONIN I (HIGH SENSITIVITY) - Abnormal; Notable for the following components:   Troponin I (High Sensitivity) 20 (*)    All other components within normal limits  RESP PANEL BY RT-PCR (RSV, FLU A&B, COVID)  RVPGX2  TROPONIN I (HIGH SENSITIVITY)    EKG EKG Interpretation Date/Time:  Wednesday February 28 2023 13:20:11 EST Ventricular Rate:  55 PR Interval:  154 QRS Duration:  106 QT Interval:  464 QTC Calculation: 443 R Axis:   39  Text Interpretation: Sinus bradycardia Anterolateral infarct , age undetermined Abnormal ECG When compared with ECG of 08-Jul-2022 23:04, PREVIOUS ECG IS PRESENT no acute ischemic change compared to previous Confirmed by Arby Barrette [phone]) on 02/28/2023 3:01:55 PM  Radiology DG Chest Portable 1 View Result Date: 02/28/2023 CLINICAL DATA:  Shortness of breath and hypoxia.  Influenza. EXAM: PORTABLE CHEST 1 VIEW COMPARISON:  Chest radiograph dated 07/09/2022. FINDINGS: There is cardiomegaly with vascular congestion and edema. Pneumonia is not excluded. Small bilateral pleural effusions. No pneumothorax. Aortic valve repair. No acute osseous pathology. IMPRESSION: Cardiomegaly with vascular congestion and edema. Electronically Signed   By: Elgie Collard M.D.   On: 02/28/2023 14:47    Procedures Procedures    Medications Ordered in ED Medications  nitroGLYCERIN (NITROGLYN) 2 % ointment 1 inch (has no administration in time range)  furosemide (LASIX) injection 40 mg (40 mg Intravenous Given 02/28/23 1608)    ED Course/ Medical Decision Making/ A&P                                 Medical Decision Making Amount and/or  Complexity of Data Reviewed Labs: ordered.  Risk Prescription drug management. Decision regarding hospitalization.   Patient presents as outlined.  She describes orthopnea and exertional dyspnea.  Symptoms seem to started with upper respiratory type associated symptoms.  At this time on examination patient was found to have oxygen saturation documented at 85% on arrival to triage.  She was then placed on 2 L nasal cannula.  As I examine the patient oxygen saturations are 98 to 100% on 2 L.  She does have notable pitting edema bilateral lower extremities.  At this time differential diagnosis includes bronchitis\pneumonia\CHF\PE\ACS.  Chest x-ray reviewed by radiology interpreted for vascular congestion and edema suggestive of CHF.  No focal consolidations identified.  Respiratory panel negative for RSV\influenza\COVID.  White count 5.4 H&H 9.7 and 32 potassium 5.9 BUN 16 creatinine 1.6 GFR 37.  At this time with combination of physical exam positive for symmetric pitting edema, chest x-ray consistent with vascular congestion and initial hypoxia on room air with patient no history of home O2 use, will administer Lasix 40 mg IV.  Pending troponin and BNP.  BNP elevated at 594.  Troponin mild elevation at 20.  At this time with combination of new hypoxia with patient initial oxygen level at 85% and then 90% on room air at rest, with vascular congestion on chest x-ray and peripheral edema consistent with CHF, will admit for hypoxia and CHF.  Consult: [facility] hospitalist Dr. Alvino Chapel accepts for admission        Final Clinical Impression(s) / ED Diagnoses Final diagnoses:  Acute on chronic congestive heart failure, unspecified heart failure type (HCC)  Hypoxia    Rx /  DC Orders ED Discharge Orders     None         Arby Barrette, MD 02/28/23 [phone]

## 2023-02-28 NOTE — ED Notes (Signed)
Pt placed on a perwick at this time

## 2023-02-28 NOTE — ED Notes (Signed)
Pt tolerated ambulation well, oxygen saturation noted 100% room air.

## 2023-03-01 DIAGNOSIS — E039 Hypothyroidism, unspecified: Secondary | ICD-10-CM

## 2023-03-01 DIAGNOSIS — I5033 Acute on chronic diastolic (congestive) heart failure: Secondary | ICD-10-CM | POA: Diagnosis not present

## 2023-03-01 DIAGNOSIS — E1169 Type 2 diabetes mellitus with other specified complication: Secondary | ICD-10-CM

## 2023-03-01 DIAGNOSIS — I480 Paroxysmal atrial fibrillation: Secondary | ICD-10-CM

## 2023-03-01 DIAGNOSIS — N1832 Chronic kidney disease, stage 3b: Secondary | ICD-10-CM | POA: Diagnosis not present

## 2023-03-01 DIAGNOSIS — F419 Anxiety disorder, unspecified: Secondary | ICD-10-CM

## 2023-03-01 DIAGNOSIS — K2100 Gastro-esophageal reflux disease with esophagitis, without bleeding: Secondary | ICD-10-CM

## 2023-03-01 DIAGNOSIS — I700 Atherosclerosis of aorta: Secondary | ICD-10-CM

## 2023-03-01 DIAGNOSIS — Z8673 Personal history of transient ischemic attack (TIA), and cerebral infarction without residual deficits: Secondary | ICD-10-CM

## 2023-03-01 DIAGNOSIS — E785 Hyperlipidemia, unspecified: Secondary | ICD-10-CM

## 2023-03-01 LAB — BASIC METABOLIC PANEL
Anion gap: 7 (ref 5–15)
BUN: 15 mg/dL (ref 8–23)
CO2: 23 mmol/L (ref 22–32)
Calcium: 9.3 mg/dL (ref 8.9–10.3)
Chloride: 106 mmol/L (ref 98–111)
Creatinine, Ser: 1.64 mg/dL — ABNORMAL HIGH (ref 0.44–1.00)
GFR, Estimated: 31 mL/min — ABNORMAL LOW (ref >60)
Glucose, Bld: 98 mg/dL (ref 70–99)
Potassium: 5 mmol/L (ref 3.5–5.1)
Sodium: 136 mmol/L (ref 135–145)

## 2023-03-01 LAB — CBC
HCT: 30 % — ABNORMAL LOW (ref 36.0–46.0)
Hemoglobin: 9.2 g/dL — ABNORMAL LOW (ref 12.0–15.0)
MCH: 29.7 pg (ref 26.0–34.0)
MCHC: 30.7 g/dL (ref 30.0–36.0)
MCV: 96.8 fL (ref 80.0–100.0)
Platelets: 238 10*3/uL (ref 150–400)
RBC: 3.1 MIL/uL — ABNORMAL LOW (ref 3.87–5.11)
RDW: 15.8 % — ABNORMAL HIGH (ref 11.5–15.5)
WBC: 5.8 10*3/uL (ref 4.0–10.5)
nRBC: 0 % (ref 0.0–0.2)

## 2023-03-01 LAB — GLUCOSE, CAPILLARY
Glucose-Capillary: 95 mg/dL (ref 70–99)
Glucose-Capillary: 101 mg/dL — ABNORMAL HIGH (ref 70–99)
Glucose-Capillary: 109 mg/dL — ABNORMAL HIGH (ref 70–99)
Glucose-Capillary: 145 mg/dL — ABNORMAL HIGH (ref 70–99)

## 2023-03-01 LAB — HEMOGLOBIN A1C
Hgb A1c MFr Bld: 6 % — ABNORMAL HIGH (ref 4.8–5.6)
Mean Plasma Glucose: 125.5 mg/dL

## 2023-03-01 MED ORDER — BOOST / RESOURCE BREEZE PO LIQD CUSTOM
1.0000 | Freq: Three times a day (TID) | ORAL | Status: DC
  Administered 2023-03-02 – 2023-03-03 (×3): 1 via ORAL

## 2023-03-01 MED ORDER — FUROSEMIDE 10 MG/ML IJ SOLN
60.0000 mg | Freq: Two times a day (BID) | INTRAMUSCULAR | Status: DC
Start: 2023-03-01 — End: 2023-03-02
  Administered 2023-03-01 – 2023-03-02 (×3): 60 mg via INTRAVENOUS
  Filled 2023-03-01 (×3): qty 6

## 2023-03-01 MED ORDER — HYDRALAZINE HCL 25 MG PO TABS
25.0000 mg | ORAL_TABLET | Freq: Three times a day (TID) | ORAL | Status: DC
  Administered 2023-03-02 – 2023-03-03 (×5): 25 mg via ORAL
  Filled 2023-03-01 (×5): qty 1

## 2023-03-01 MED ORDER — ISOSORBIDE MONONITRATE ER 30 MG PO TB24
15.0000 mg | ORAL_TABLET | Freq: Every day | ORAL | Status: DC
  Administered 2023-03-01 – 2023-03-03 (×3): 15 mg via ORAL
  Filled 2023-03-01 (×3): qty 1

## 2023-03-01 NOTE — Assessment & Plan Note (Signed)
Continue blood pressure control. 

## 2023-03-01 NOTE — Progress Notes (Signed)
Progress Note   Patient: Mary Mccarthy WGN:[id number] DOB: [birth year] DOA: 02/28/2023     1 DOS: the patient was seen and examined on 03/01/2023   Brief hospital course: Mrs. Marlar was admitted to the hospital with the working diagnosis of heart failure exacerbation.   83 yo female with the past medical history of paroxysmal atrial fibrillation,, recurrent GI bleeding, severe aortic stenosis sp TAVR, complicated with transient heart block, hypertension, coronary artery disease and hyperlipidemia who presented with dyspnea. Patient has been not adherent to a fluid and salt restricted diet, she has been taking furosemide only as needed. She developed progressive dyspnea and lower extremity edema. On her initial physical examination her blood pressure was 185/70, HR 58, RR 29 and 02 saturation 90%, lungs with no wheezing or rhonchi, positive rales, heart with S1 and S2 present and regular, with no gallops, rubs or murmurs, abdomen with no distention and positive lower extremity edema.   Na 137, K 5.9 Cl 111, bicarbonate 22, glucose 122, bun 16 cr 1,40  BNP 594 High sensitive troponin 20 and 20  Wbc 5,4 hgb 9,.7 plt 250  Sars covid 19 negative Influenza negative   Chest radiograph with cardiomegaly, bilateral hilar vascular congestion, cephalization of the vasculature, bilateral central interstitial infiltrates, small bilateral pleural effusions.   EKG 55 bpm, normal axis, normal intervals, sinus rhythm with poor R R wave progression with no significant ST segment or T wave changes.     Assessment and Plan: * Acute on chronic diastolic CHF (congestive heart failure) (HCC) 10/24 echocardiogram with preserved LV systolic function with EF 60 to 65%, mild LVH, LA with severe dilatation, sp TAVR with perivalvular leak. RVSP 51.7 mmHg  Urine output is 1200 ml Systolic blood pressure 150 mmHg.   Plan to increase furosemide to 60 mg IV bid  Will dc amlodipine and add hydralazine and isosorbide  for after load reduction.  Holding RAAS due to low acutely low GFR.   CKD (chronic kidney disease), stage III (HCC) Stage 3b CKD   Renal function today with serum cr at 1,64 with K at 5,0 and serum bicarbonate at 23. Na 136   Plan to continue diuresis, follow up renal function and electrolytes   Hyperlipidemia associated with type 2 diabetes mellitus (HCC) Continue glucose cover and monitoring with insulin sliding scale Fasting glucose this am 98 mg/dl   Aortic atherosclerosis (HCC) Continue blood pressure control   PAF with RVR: CHA2DS2-VASc 9.  On amiodarone. Continue rhythm control with amiodarone.  Not on anticoagulation   Anxiety Continue as needed alprazolam and citalopram.   History of CVA (cerebrovascular accident) Continue blood pressure control  She is not on anticoagulation for atrial fibrillation   GERD Continue with pantoprazole   Hypothyroidism Continue with levothyroxine         Subjective: Patient with improvement in dyspnea and edema but not back to baseline, apparently dietary indiscretion at home   Physical Exam: Vitals:   03/01/23 0630 03/01/23 0634 03/01/23 0719 03/01/23 1153  BP:   (!) 157/57 (!) 166/47  Pulse:  64 65 61  Resp:   18 20  Temp:   98.6 F (37 C) 98 F (36.7 C)  TempSrc:   Oral Oral  SpO2: 91% 95% 93% 94%  Weight:      Height:       Neurology awake and alert ENT with mild pallor Cardiovascular with S1 and S2 present and regular with no gallops or rubs, positive systolic murmur at the right  lower sternal border Mild JVD Positive lower extremity edema ++ Respiratory with bilateral rales with no wheezing or rhonchi Abdomen with no distention  Data Reviewed:    Family Communication: no family at the bedside   Disposition: Status is: Inpatient Remains inpatient appropriate because: heart failure with Iv diuresis   Planned Discharge Destination: Home      Author: Coralie Keens, MD 03/01/2023 5:01  PM  For on call review [email or web address].

## 2023-03-01 NOTE — Discharge Instructions (Signed)
[facility] assistance programs. If you are behind on your bills and expenses, and need some help to make it through a short term hardship or financial emergency, there are several organizations and charities in the [place] area that may be able to help. They range from the [employer], [employer], Landscape architect of [employer] and the local community action agency, the [employer], [employer]. These groups may be able to provide you resources to help pay your utility bills, rent, and they even offer housing assistance.  Crisis assistance program Find help for paying your rent, electric bills, free food, and even funds to pay your mortgage. The [employer] [phone]) offers several services to local families, as funding allows. The Emergency Assistance Program (EAP), which they administer, provides household goods, free food, clothing, and financial aid to people in need in the [facility] area. The EAP program does have some qualification, and counselors will interview clients for financial assistance by written referral only. Referrals need to be made by the Department of Social Services or by other EAP approved human services agencies or charities in the area.  Money for resources for emergency assistance are available for security deposits for rent, water, electric, and gas, past due rent, utility bills, past due mortgage payments, food, and clothing. The [employer] also operates a Programme researcher, broadcasting/film/video on the site. More [employer].  [employer] of [employer], which can be reached at [phone], offers emergency assistance programs for those in need of help, such as food, rent assistance, a soup kitchen, shelter, and clothing. They are based in [facility] but provide a number of services to those that qualify for assistance. Continue with [employer]  programs.  The [facility] Department of Social Services may be able to offer temporary financial assistance and cash grants for paying rent and utilities. Help may be provided for local county residents who may be experiencing personal crisis when other resources, including government programs, are not available. Call [phone]  [place] [place] [employer] [employer], which is based in [place], provides financial assistance of up to $50.00 to help pay for rent, utilities, cooling bills, rent, and prescription medications. The program also provides secondhand furniture to those in need. [phone]  [employer] is a Geneticist, molecular. The organization can offer emergency assistance for paying rent, electric bills, utilities, food, household products and furniture. They offer extensive emergency and transitional housing for families, children and single women, and also run a Boy's and [employer]. [employer], [employer], and other aid offered too. [address] [place], [place] [address], [phone]  Additional locations of the [employer] are in [place] and other nearby communities. When you have an emergency, need free food, money for basic needs, or just need assistance around Christmas, then the [employer] may have the resources you need. Or they can refer you to nearby agencies. Learn more.  [place] Low Income Risk manager - This is offered for [facility] families. The federal government created [employer] Program provides a one-time cash grant payment to help eligible low-income families pay their electric and heating bills. [address] [place], [place] [address], [phone]  Government and [employer] - The county administers several emergency and self-sufficiency programs. Residents of [place] can get help with energy bills and food, rent, and  other expenses. In addition,  work with a Sports coach who may be able to help you find a job or improve your employment skills. More [place] public assistance.  [place] Emergency Assistance - A program offers emergency utility and rent funds for greater [place] area residents. The program can also provide counseling and referrals to charities and government programs. Also provides food and a free meal program that serves lunch Mondays - Saturdays and dinner seven days per week to individuals in the community. [address] [place], [place] [address], [phone]  [employer] - Offers affordable apartment and housing communities across [place] and [place]. The low income and seniors can access public housing, rental assistance to qualified applicants, and apply for the section 8 rent subsidy program. Other programs include Chiropractor and Engineer, maintenance. [address], [place], [place] [address], dial [phone].  Basic needs such as clothing - Low income families can receive free items (school supplies, clothes, holiday assistance, etc.) from clothing closets while more moderate income [address] families can shop at [employer]. Locations across the area help the needy. Get information on [place] [facility] free clothing centers.  The [facility] provides transitional housing to veterans and the disabled. Clients will also access other services too, including life skills classes, case management, and assistance in finding permanent housing. [address] [place], [place] [address], call [phone]  [employer] Transitional Housing in [place] is for people who were just evicted or that are formerly homeless. The non-profit will also help then gain self-sufficiency, find a home or apartment to live in, and also provides information on rent assistance when needed. Dial ([phone]  [employer] Partnership to End Homelessness is available in [place]. Families that were evicted or that are homeless can gain shelter, food, clothing, furniture, and also emergency financial assistance. Other services include financial skills and life skills coaching, job training, and case management. [address] [place], [place] [address]. Telephone [phone].  The [employer], [employer]. runs the [employer]. This can help people save money on their heating and summer cooling bills, and is free to low income families. Free upgrades can be made to your home. Phone [phone]  Many of the non-profits and programs mentioned above are all inclusive, meaning they can meet many needs of the low income, such as energy bills, food, rent, and more. However there are several organizations that focus just on rent and housing. Read more on rent assistance in [place] region.  Food pantry and assistance Some of the local food pantries and distribution centers to call for free food and groceries include [employer] [place] [employer] (phone 2285956691), [facility] (phone ([phone]) and also [employer]. Dial [phone].  Several other food banks in the region provide clothing, free food and meals, access to soup kitchens and other help. Find the addresses and phone numbers of more food pantries in [place]. http://www.needhelppayingbills.com/html/[place]assistance_pro.html

## 2023-03-01 NOTE — Assessment & Plan Note (Addendum)
Continue as needed alprazolam and citalopram.

## 2023-03-01 NOTE — TOC Initial Note (Addendum)
Transition of Care [facility]) - Initial/Assessment Note    Patient Details  Name: Mary Mccarthy MRN: [id number] Date of Birth: 26-Oct-1940  Transition of Care [facility]) CM/SW Contact:    [name], RN Phone Number: 03/01/2023, 1:50 PM  Clinical Narrative:                 From home with son, has PCP and insurance on file, states has no HH services in place at this time,  she states she has had HH in the past and she does not really need any one there since her son is there with her all the time ,has rollator and cane at home.  States son will transport her  home at [employer] and he is support system, states [facility] [address].  Pta self ambulatory  with rollator and cane.  May benefit from pt eval.  Expected Discharge Plan: Home/Self Care Barriers to Discharge: Continued Medical Work up   Patient Goals and CMS Choice Patient states their goals for this hospitalization and ongoing recovery are:: return home with son   Choice offered to / list presented to : NA      Expected Discharge Plan and Services In-house Referral: NA Discharge Planning Services: CM Consult Post Acute Care Choice: NA Living arrangements for the past 2 months: Single Family Home                 DME Arranged: N/A DME Agency: NA       HH Arranged: NA          Prior Living Arrangements/Services Living arrangements for the past 2 months: Single Family Home Lives with:: Adult Children (son) Patient language and need for interpreter reviewed:: Yes Do you feel safe going back to the place where you live?: Yes      Need for Family Participation in Patient Care: Yes (Comment) Care giver support system in place?: Yes (comment) Current home services: DME (rollator, cane) Criminal Activity/Legal Involvement Pertinent to Current Situation/Hospitalization: No - Comment as needed  Activities of Daily Living   ADL Screening (condition at time of admission) Independently performs ADLs?: Yes  (appropriate for developmental age) Is the patient deaf or have difficulty hearing?: Yes Does the patient have difficulty seeing, even when wearing glasses/contacts?: No Does the patient have difficulty concentrating, remembering, or making decisions?: No  Permission Sought/Granted Permission sought to share information with : Case Manager Permission granted to share information with : Yes, Verbal Permission Granted              Emotional Assessment Appearance:: Appears stated age Attitude/Demeanor/Rapport: Engaged Affect (typically observed): Appropriate Orientation: : Oriented to Self, Oriented to Place, Oriented to  Time, Oriented to Situation Alcohol / Substance Use: Not Applicable Psych Involvement: No (comment)  Admission diagnosis:  Hypoxia [R09.02] Acute on chronic diastolic CHF (congestive heart failure) (HCC) [I50.33] Acute on chronic congestive heart failure, unspecified heart failure type [facility]) [I50.9] Patient Active Problem List   Diagnosis Date Noted   Acute on chronic diastolic CHF (congestive heart failure) (HCC) 02/28/2023   Urinary frequency 03/29/2022   UTI (urinary tract infection) 03/29/2022   Hypothyroidism 05/28/2021   Vitamin D deficiency 04/20/2021   Abnormal TSH 04/20/2021   Encounter for well adult exam with abnormal findings 02/16/2021   Long term current use of amiodarone for rate-rhythm control of A. fib 11/28/2020   Chronic respiratory failure (HCC) 11/22/2020   Dehydration 11/02/2020   Type 2 diabetes mellitus with obesity (HCC) 11/02/2020  CKD (chronic kidney disease), stage III (HCC) 11/01/2020   Transient post TAVR CHB -> not seen on follow-up Zio patch 10/28/2020   S/P TAVR (transcatheter aortic valve replacement) 10/26/2020   Melena 10/20/2020   Symptomatic anemia 10/19/2020   Acute bronchitis 09/21/2020   Hypercoagulable state due to atrial fibrillation (HCC) 08/23/2020   PAF with RVR: CHA2DS2-VASc 9.  On amiodarone. 08/11/2020    History of CVA (cerebrovascular accident) 08/10/2020   Hypomagnesemia 08/10/2020   Severe aortic stenosis 08/2020   Aortic atherosclerosis (HCC) 03/18/2020   B12 deficiency 09/21/2019   Cough 03/14/2018   Wheezing 03/14/2018   Anxiety 09/11/2017   Osteoporosis 03/10/2016   Coronary artery disease, non-occlusive 05/24/2015   GI bleed    Pulmonary hypertension (HCC)    CHF (congestive heart failure), NYHA class II, chronic, diastolic (HCC) 05/20/2015   Gastric ulceration    AVM (arteriovenous malformation) of colon    Demand ischemia of myocardium (HCC)    Right knee pain 05/21/2014   Anemia, iron deficiency 05/20/2013   Menopausal and postmenopausal disorder 12/07/2009   INSOMNIA 09/24/2008   Allergic rhinitis 06/24/2007   GERD 06/24/2007   History of colonic polyps 06/24/2007   Hyperlipidemia associated with type 2 diabetes mellitus (HCC) 10/08/2006   Anxiety with depression 10/08/2006   Essential hypertension 10/08/2006   Peptic ulcer 10/08/2006   Type 2 diabetes mellitus with hyperglycemia, without long-term current use of insulin (HCC) 10/05/2006   Morbid obesity (HCC) 10/05/2006   PCP:  Corwin Levins, MD Pharmacy:   [phone] [place], [place] - [address] AT [facility] [address] [place] [address] Phone: [phone] Fax: [phone]  [facility] [address] [place] [address] Phone: [phone] Fax: [phone]     Social Drivers of Health (SDOH) Social History: SDOH Screenings   Food Insecurity: Food Insecurity Present (03/01/2023)  Housing: High Risk (03/01/2023)  Transportation Needs: No Transportation Needs (03/01/2023)  Utilities: At Risk (03/01/2023)  Alcohol Screen: Low Risk  (05/11/2022)  Depression (PHQ2-9): Low Risk  (09/25/2022)  Financial Resource Strain: Low Risk  (05/11/2022)  Physical Activity: Inactive (05/11/2022)  Social Connections: Socially Isolated  (03/01/2023)  Stress: No Stress Concern Present (05/11/2022)  Tobacco Use: Low Risk  (02/28/2023)   SDOH Interventions:     Readmission Risk Interventions    11/04/2020   11:24 AM  Readmission Risk Prevention Plan  Transportation Screening Complete  PCP or Specialist Appt within 3-5 Days Complete  HRI or Home Care Consult Complete  Social Work Consult for Recovery Care Planning/Counseling Not Complete  Palliative Care Screening Not Applicable  Medication Review Oceanographer) Complete

## 2023-03-01 NOTE — Progress Notes (Signed)
CSW addressed SDOH needs for food and housing. Resources have been added to AVS.  Johnnette Gourd, MSW, LCSWA Transitions of Care [phone]

## 2023-03-01 NOTE — Assessment & Plan Note (Addendum)
Stage 3b CKD, hyponatremia hyperkalemia.  Baseline serum cr 1,5 to 2,0   Her volume status has improved, renal function today with serum cr at 2,0, K is 4.4 and serum bicarbonate at 28  Na 131   Patient will hold on furosemide until Monday.  Will need outpatient follow up renal function and electrolytes in 7 days.

## 2023-03-01 NOTE — Assessment & Plan Note (Signed)
Continue with levothyroxine  

## 2023-03-01 NOTE — Assessment & Plan Note (Addendum)
Her glucose remained stable, she was placed on insulin sliding scale while hospitalized.  Fasting glucose at the time of discharge is 150 mg/dl. Continue statin therapy.

## 2023-03-01 NOTE — Assessment & Plan Note (Addendum)
Continue blood pressure control  She is not on anticoagulation for atrial fibrillation due to recurrent GI bleeding.

## 2023-03-01 NOTE — Hospital Course (Addendum)
Mary Mccarthy was admitted to the hospital with the working diagnosis of heart failure exacerbation.   83 yo female with the past medical history of paroxysmal atrial fibrillation,, recurrent GI bleeding, severe aortic stenosis sp TAVR, complicated with transient heart block, hypertension, coronary artery disease and hyperlipidemia who presented with dyspnea. Patient has been not adherent to a fluid and salt restricted diet, she has been taking furosemide only as needed. She developed progressive dyspnea and lower extremity edema. On her initial physical examination her blood pressure was 185/70, HR 58, RR 29 and 02 saturation 90%, lungs with no wheezing or rhonchi, positive rales, heart with S1 and S2 present and regular, with no gallops, rubs or murmurs, abdomen with no distention and positive lower extremity edema.   Na 137, K 5.9 Cl 111, bicarbonate 22, glucose 122, bun 16 cr 1,40  BNP 594 High sensitive troponin 20 and 20  Wbc 5,4 hgb 9,.7 plt 250  Sars covid 19 negative Influenza negative   Chest radiograph with cardiomegaly, bilateral hilar vascular congestion, cephalization of the vasculature, bilateral central interstitial infiltrates, small bilateral pleural effusions.   EKG 55 bpm, normal axis, normal intervals, sinus rhythm with poor R R wave progression with no significant ST segment or T wave changes.   01/24 responding well to diuresis,

## 2023-03-01 NOTE — Assessment & Plan Note (Addendum)
10/24 echocardiogram with preserved LV systolic function with EF 60 to 65%, mild LVH, LA with severe dilatation, sp TAVR with perivalvular leak. RVSP 51.7 mmHg  Urine output is 2,450  ml Systolic blood pressure 140 mmHg.   Continue with hydralazine and isosorbide for after load reduction.  Had one dose of furosemide this am, will hold PM dose and follow up renal function in am.  Holding RAAS due to low acutely low GFR.   Acute hypoxemic respiratory failure due to acute cardiogenic pulmonary edema.

## 2023-03-01 NOTE — Plan of Care (Signed)
Care plan reviewed.

## 2023-03-01 NOTE — Assessment & Plan Note (Addendum)
Continue rhythm control with amiodarone.  Not on anticoagulation due to recurrent gastro intestinal bleeding.

## 2023-03-01 NOTE — Assessment & Plan Note (Signed)
Continue with pantoprazole/.  

## 2023-03-01 NOTE — Progress Notes (Signed)
Initial Nutrition Assessment  DOCUMENTATION CODES:   Not applicable  INTERVENTION:  -Boost Breeze po TID, each supplement provides 250 kcal and 9 grams of protein -Encouraged small, frequent meals    NUTRITION DIAGNOSIS:   Increased nutrient needs related to acute illness as evidenced by estimated needs.  GOAL:   Patient will meet greater than or equal to 90% of their needs   MONITOR:   PO intake, Supplement acceptance  REASON FOR ASSESSMENT:   Malnutrition Screening Tool    ASSESSMENT:   Pt presents with shortness of breath with acute on chronic diastolic CHF. PMH of paroxysmal A-fib, TIA/CVA, HTN, hyperlipidemia, CAD, severe aortic stenosis s/p TAVR complicated by transient complete heart block.   Pt reports not having a good appetite for past 3-4 weeks due to consistent nausea, vomiting, diarrhea, but has felt good today. She reports not having a big appetite in general prior to the sickness and states she thinks it is from her taste changing and getting older. Pt reports having one meal a day which is usually dinner. She reports dinner months ago being chicken/roast beef, veggies, and jello. She reports that in recent months dinner has been from fast food that her son brings her like [employer] or [employer]. Pt reports not following a low salt diet and does not feel a difference in fluid from eating high sodium meals. Pt reports not liking the taste of protein shakes but is open to trying a [employer]. Pt reports only taking a vitamin d supplement at home.   Pt reports losing weight over the past few years and states she was 300 lbs a few years ago and has lost about 100 lbs over the past few years. She is unable to report a more recent usual body weight. Pt reports that the weight loss was originally intentional and did this through making lifestyle changes, but the past year attributes wt loss being from poor appetite. Per chart review pt weighed 199.5 lb in 08/24 and  currently weighs 178 lb. This is a 10.8% weight loss in 5 months which is clinically significant for the time frame, but could be due to fluid changes.  Pt reports getting around the house well when she uses her cane and walker.   Meds:  Novolog 0-9 units TID, Miralax PRN  Labs: Glucose 95-145, A1C 6.0  NUTRITION - FOCUSED PHYSICAL EXAM:  Flowsheet Row Most Recent Value  Orbital Region Mild depletion  Upper Arm Region Moderate depletion  Thoracic and Lumbar Region No depletion  Buccal Region No depletion  Temple Region Mild depletion  Clavicle Bone Region No depletion  Clavicle and Acromion Bone Region No depletion  Scapular Bone Region No depletion  Dorsal Hand Mild depletion  Patellar Region No depletion  Anterior Thigh Region No depletion  Posterior Calf Region Unable to assess  [pitting edema]  Edema (RD Assessment) Mild  Hair Reviewed  [pt reports hair loss-unsure of cause]  Eyes Reviewed  Mouth Reviewed  Skin Reviewed  Nails Reviewed       Diet Order:   Diet Order             Diet Heart Room service appropriate? Yes; Fluid consistency: Thin; Fluid restriction: 2000 mL Fluid  Diet effective now                   EDUCATION NEEDS:   Education needs have been addressed  Skin:  Skin Assessment: Reviewed RN Assessment  Last BM:  1/23  Height:  Ht Readings from Last 1 Encounters:  02/28/23 5\' 1"  (1.549 m)    Weight:   Wt Readings from Last 1 Encounters:  03/01/23 80.9 kg    Ideal Body Weight:  47.7 kg  BMI:  Body mass index is 33.7 kg/m.  Estimated Nutritional Needs:   Kcal:  1300-1500  Protein:  65-80 g  Fluid:  >1.5 L    Mary Pro, MS Dietetic Intern

## 2023-03-02 DIAGNOSIS — I5033 Acute on chronic diastolic (congestive) heart failure: Secondary | ICD-10-CM | POA: Diagnosis not present

## 2023-03-02 DIAGNOSIS — I700 Atherosclerosis of aorta: Secondary | ICD-10-CM | POA: Diagnosis not present

## 2023-03-02 DIAGNOSIS — N1832 Chronic kidney disease, stage 3b: Secondary | ICD-10-CM | POA: Diagnosis not present

## 2023-03-02 DIAGNOSIS — E1169 Type 2 diabetes mellitus with other specified complication: Secondary | ICD-10-CM | POA: Diagnosis not present

## 2023-03-02 LAB — BASIC METABOLIC PANEL
Anion gap: 7 (ref 5–15)
BUN: 18 mg/dL (ref 8–23)
CO2: 26 mmol/L (ref 22–32)
Calcium: 8.5 mg/dL — ABNORMAL LOW (ref 8.9–10.3)
Chloride: 101 mmol/L (ref 98–111)
Creatinine, Ser: 1.94 mg/dL — ABNORMAL HIGH (ref 0.44–1.00)
GFR, Estimated: 25 mL/min — ABNORMAL LOW (ref >60)
Glucose, Bld: 111 mg/dL — ABNORMAL HIGH (ref 70–99)
Potassium: 4.6 mmol/L (ref 3.5–5.1)
Sodium: 134 mmol/L — ABNORMAL LOW (ref 135–145)

## 2023-03-02 LAB — GLUCOSE, CAPILLARY
Glucose-Capillary: 114 mg/dL — ABNORMAL HIGH (ref 70–99)
Glucose-Capillary: 124 mg/dL — ABNORMAL HIGH (ref 70–99)
Glucose-Capillary: 157 mg/dL — ABNORMAL HIGH (ref 70–99)
Glucose-Capillary: 138 mg/dL — ABNORMAL HIGH (ref 70–99)

## 2023-03-02 LAB — MAGNESIUM: Magnesium: 1.5 mg/dL — ABNORMAL LOW (ref 1.7–2.4)

## 2023-03-02 MED ORDER — MAGNESIUM SULFATE 4 GM/100ML IV SOLN
4.0000 g | Freq: Once | INTRAVENOUS | Status: AC
  Administered 2023-03-02: 4 g via INTRAVENOUS
  Filled 2023-03-02: qty 100

## 2023-03-02 NOTE — Progress Notes (Addendum)
Progress Note   Patient: Mary Mccarthy ZOX:[id number] DOB: [birth year] DOA: 02/28/2023     2 DOS: the patient was seen and examined on 03/02/2023   Brief hospital course: Mrs. Scharrer was admitted to the hospital with the working diagnosis of heart failure exacerbation.   83 yo female with the past medical history of paroxysmal atrial fibrillation,, recurrent GI bleeding, severe aortic stenosis sp TAVR, complicated with transient heart block, hypertension, coronary artery disease and hyperlipidemia who presented with dyspnea. Patient has been not adherent to a fluid and salt restricted diet, she has been taking furosemide only as needed. She developed progressive dyspnea and lower extremity edema. On her initial physical examination her blood pressure was 185/70, HR 58, RR 29 and 02 saturation 90%, lungs with no wheezing or rhonchi, positive rales, heart with S1 and S2 present and regular, with no gallops, rubs or murmurs, abdomen with no distention and positive lower extremity edema.   Na 137, K 5.9 Cl 111, bicarbonate 22, glucose 122, bun 16 cr 1,40  BNP 594 High sensitive troponin 20 and 20  Wbc 5,4 hgb 9,.7 plt 250  Sars covid 19 negative Influenza negative   Chest radiograph with cardiomegaly, bilateral hilar vascular congestion, cephalization of the vasculature, bilateral central interstitial infiltrates, small bilateral pleural effusions.   EKG 55 bpm, normal axis, normal intervals, sinus rhythm with poor R R wave progression with no significant ST segment or T wave changes.   01/24 responding well to diuresis,    Assessment and Plan: * Acute on chronic diastolic CHF (congestive heart failure) (HCC) 10/24 echocardiogram with preserved LV systolic function with EF 60 to 65%, mild LVH, LA with severe dilatation, sp TAVR with perivalvular leak. RVSP 51.7 mmHg  Urine output is 2,450  ml Systolic blood pressure 140 mmHg.   Continue with hydralazine and isosorbide for after load  reduction.  Had one dose of furosemide this am, will hold PM dose and follow up renal function in am.  Holding RAAS due to low acutely low GFR.   Acute hypoxemic respiratory failure due to acute cardiogenic pulmonary edema.   CKD (chronic kidney disease), stage III (HCC) AKI, Stage 3b CKD   Volume status has improved, renal function with serum cr at 1,94 with K at 4,6 and serum bicarbonate at 26  Na 134 Mg 1.5   Add Mag sulfate.  Follow up renal function and electrolytes   Hyperlipidemia associated with type 2 diabetes mellitus (HCC) Continue glucose cover and monitoring with insulin sliding scale Fasting glucose this am 111 mg/dl   Aortic atherosclerosis (HCC) Continue blood pressure control   PAF with RVR: CHA2DS2-VASc 9.  On amiodarone. Continue rhythm control with amiodarone.  Not on anticoagulation   Anxiety Continue as needed alprazolam and citalopram.   History of CVA (cerebrovascular accident) Continue blood pressure control  She is not on anticoagulation for atrial fibrillation   GERD Continue with pantoprazole   Hypothyroidism Continue with levothyroxine         Subjective: Patient with improvement in dyspnea and edema, no chest pain. Anxious to go home   Physical Exam: Vitals:   03/02/23 0418 03/02/23 0750 03/02/23 1135 03/02/23 1524  BP: (!) 151/60 (!) 153/54 (!) 138/46 (!) 116/34  Pulse: 60 66 (!) 56 (!) 58  Resp: 18 17 18 17   Temp: 97.6 F (36.4 C) 97.7 F (36.5 C) 98 F (36.7 C) 97.6 F (36.4 C)  TempSrc: Axillary Oral Oral Oral  SpO2: 95% 99% 98% 99%  Weight:  Height:       Neurology awake and alert ENT with no pallor Cardiovascular with S1 and S2 present and regular with no gallops, rubs or murmurs No JVD Lower extremity trace Respiratory with no rales or wheezing, no rhonchi Abdomen with no distention  Data Reviewed:    Family Communication: I spoke with patient's son at the bedside, we talked in detail about patient's  condition, plan of care and prognosis and all questions were addressed.   Disposition: Status is: Inpatient Remains inpatient appropriate because: possible discharge tomorrow pending renal function   Planned Discharge Destination: Home      Author: Coralie Keens, MD 03/02/2023 5:31 PM  For on call review [email or web address].

## 2023-03-02 NOTE — Progress Notes (Signed)
Heart Failure Navigator Progress Note  Assessed for Heart & Vascular TOC clinic readiness.  Patient does not meet criteria due to EF 60-65%, Per Dr. Ella Jubilee follow up with General Cardiology. .   Navigator will sign off at this time.   Rhae Hammock, BSN, Scientist, clinical (histocompatibility and immunogenetics) Only

## 2023-03-02 NOTE — Plan of Care (Signed)
Care plan reviewed.

## 2023-03-02 NOTE — Consult Note (Signed)
Value-Based [facility] Liaison Consult Note   03/02/2023  Mary Mccarthy [birth year] [id number]  Insurance: [employer]   Primary Care Provider: Corwin Levins, MD, with [place] at [facility], this provider is listed for the transition of care follow up appointments  and VBCI TOC calls Family member also at the bedside.    The patient was screened for readmission Prevention review hospitalization with noted medium risk score for unplanned readmission risk 1hospital admissions in 6 months.  The patient was assessed for potential Community Care Coordination service needs for post hospital transition for care coordination. Review of patient's electronic medical record reveals patient is at risk for food insecurity. Inpatient TOC LCSW gave resources.  Plan: [facility] Liaison will continue to follow progress and disposition to asess for post hospital community care coordination/management needs.  Referral request for community care coordination:Anticipate VBCI TOC team for follow up.  However, will refer patient to [facility] Coordination team regarding needs for follow up.   VBCI Community Care, Population Health does not replace or interfere with any arrangements made by the Inpatient Transition of Care team.   For questions contact:   Charlesetta Shanks, RN, BSN, CCM [place]  [facility] Liaison Direct Dial: [phone] or secure chat Email: [email or web address]

## 2023-03-02 NOTE — Progress Notes (Signed)
SATURATION QUALIFICATIONS: (This note is used to comply with regulatory documentation for home oxygen)  Patient Saturations on Room Air at Rest = 99%  Patient Saturations on Room Air while Ambulating = 83%  Patient Saturations on 3 Liters of oxygen while Ambulating = 90%  Please briefly explain why patient needs home oxygen: Pt required 3L of supplemental O2 to maintain her sats >/= 90% when ambulating.   Virgil Benedict, PT, DPT Acute Rehabilitation Services  Office: [phone]

## 2023-03-02 NOTE — Evaluation (Signed)
Physical Therapy Evaluation Patient Details Name: Mary Mccarthy MRN: [id number] DOB: 09/01/1940 Today's Date: 03/02/2023  History of Present Illness  Pt is an 83 y.o. female who presented 02/28/23 with SOB. Admitted with CHF exacerbation. PMH: CVA, CAD, HTN, HLD, paroxysmal afib, DM2, anemia, aortic stenosis s/p TAVR, osteoporosis, GERD, CKD stage IIIb   Clinical Impression  Pt presents with condition above and deficits mentioned below, see PT Problem List. PTA, she was mod I utilizing a rollator in the home and a QC outside the home. She lives with her son in a 1-level house with 1 STE. She has 24/7 care available to her at d/c. Currently, the pt is able to transfer to stand with CGA but needed intermittent minA for balance and RW management when ambulating with a RW. She demonstrated poor insight into her deficits in endurance and strength, insisting she could ambulate further, but she would increasingly flex at her trunk and get more distal from her RW as distance progressed, ultimately needing cueing to sit and rest. She demonstrates deficits in balance, activity tolerance/endurance, and strength. She is at risk for falls. Administered gait belt to son and educated pt and son on her risk for falls, recs for guarding/assisting pt with all standing mobility, and use of rollator/RW with all standing mobility at this time. They verbalized understanding. Educated pt on managing her CHF and provided pt with exercising with CHF handout, CHF booklet, and energy conservation handout. She is currently requiring 3L of supplemental O2 to maintain her sats >/= 90% when ambulating. She could benefit from follow-up with HHPT upon d/c to address her deficits above and reduce her risk for falls. Will continue to follow acutely.      If plan is discharge home, recommend the following: A little help with walking and/or transfers;A little help with bathing/dressing/bathroom;Assistance with cooking/housework;Assist for  transportation;Help with stairs or ramp for entrance   Can travel by private vehicle        Equipment Recommendations None recommended by PT  Recommendations for Other Services  OT consult    Functional Status Assessment Patient has had a recent decline in their functional status and demonstrates the ability to make significant improvements in function in a reasonable and predictable amount of time.     Precautions / Restrictions Precautions Precautions: Fall;Other (comment) Precaution Comments: watch SpO2 Restrictions Weight Bearing Restrictions Per Provider Order: No      Mobility  Bed Mobility               General bed mobility comments: Pt up in chair upon arrival and at end of session    Transfers Overall transfer level: Needs assistance Equipment used: Rolling walker (2 wheels) Transfers: Sit to/from Stand Sit to Stand: Contact guard assist           General transfer comment: Pt tends to pull up on RW to stand. Needs cues to reach back to ensure she was in front of a chair before sitting. CGA for safety, no LOB    Ambulation/Gait Ambulation/Gait assistance: Contact guard assist, Min assist Gait Distance (Feet): 110 Feet (x2 bouts of ~110 ft each bout) Assistive device: Rolling walker (2 wheels) Gait Pattern/deviations: Step-through pattern, Decreased stride length, Shuffle, Trunk flexed Gait velocity: reduced Gait velocity interpretation: <1.31 ft/sec, indicative of household ambulator   General Gait Details: Pt ambulates with short, slow, shuffling steps while maintaining a kyphotic posture. Her trunk flexion and distance from her RW increased as distance progressed and pt became apparently more  fatigued, even though pt verbally denying it and insisting she could go further. Pt ultimately needed cues to sit to ensure her safety at a point. Several min seated rest break needed to then ambulate back to room. Pt needed CGA for safety, gradually progressing  to needing minA for safety and balance as her trunk flexion and distance from her RW increased though. MinA also needed to turn her RW 13' as she tends to take a very wide turn, but she was able to turn the RW to negotiate around obstacles in her path as needed. Cues provided for upright posture and proximity to [employer] Mobility     Tilt Bed    Modified Rankin (Stroke Patients Only)       Balance Overall balance assessment: Needs assistance Sitting-balance support: No upper extremity supported, Feet supported Sitting balance-[name]: Fair     Standing balance support: Bilateral upper extremity supported, During functional activity, Reliant on assistive device for balance Standing balance-[name]: Poor Standing balance comment: Reliant on RW                             Pertinent Vitals/Pain Pain Assessment Pain Assessment: No/denies pain    Home Living Family/patient expects to be discharged to:: Private residence Living Arrangements: Children (son) Available Help at Discharge: Family;Available 24 hours/day Type of Home: House Home Access: Stairs to enter Entrance Stairs-Rails: Can reach both Entrance Stairs-Number of Steps: 1   Home Layout: One level Home Equipment: Agricultural consultant (2 wheels);Rollator (4 wheels);Grab bars - tub/shower;Shower seat;Cane - quad (nephew can bring the shower chair to pt if needed, not currently in the house though)      Prior Function Prior Level of Function : Independent/Modified Independent             Mobility Comments: Uses rollator inside home, QC outside home ADLs Comments: no longer drives, dresses herself, bathes herself in standing while holding onto grab bar     Extremity/Trunk Assessment   Upper Extremity Assessment Upper Extremity Assessment: Defer to OT evaluation    Lower Extremity Assessment Lower Extremity Assessment: Generalized weakness    Cervical / Trunk  Assessment Cervical / Trunk Assessment: Kyphotic  Communication   Communication Communication: No apparent difficulties  Cognition Arousal: Alert Behavior During Therapy: WFL for tasks assessed/performed Overall Cognitive Status: Impaired/Different from baseline Area of Impairment: Safety/judgement                         Safety/Judgement: Decreased awareness of safety, Decreased awareness of deficits     General Comments: Decreased insight into her deficits, insisting she could ambulate further but then began to flex increasingly more as distance progressed and ultimately needed cue to rest to maintain her safety. This may be pt's baseline though and pt was just unaware of how weak she has become from this admission        General Comments General comments (skin integrity, edema, etc.): SpO2 99% on RA at rest, down to 83% on RA when ambulating, continued to drop to 79% even when bumped up to 2L when ambulating thereby needing cues to stop and breathe on 3L before rebounding to 90s%, >/= 90% on 3L when ambulating; educated pt on managing her CHF by reducing sodium/processed foods intake and by weighing herself daily, progressing mobility/exercise, frequent mobility, eating fresh/frozen fruits/veggies,  rinsing canned goods; provided pt with exercising with CHF handout, CHF booklet, and energy conservation handout; administered gait belt to son and educated pt and son on her risk for falls, recs for guarding/assist with all standing mobility, and use of rollator/RW with all standing mobility at this time, they verbalized understanding    Exercises     Assessment/Plan    PT Assessment Patient needs continued PT services  PT Problem List Decreased strength;Decreased activity tolerance;Decreased balance;Decreased mobility;Cardiopulmonary status limiting activity;Decreased knowledge of use of DME;Decreased safety awareness       PT Treatment Interventions DME instruction;Gait  training;Stair training;Functional mobility training;Therapeutic activities;Therapeutic exercise;Balance training;Neuromuscular re-education;Patient/family education    PT Goals (Current goals can be found in the Care Plan section)  Acute Rehab PT Goals Patient Stated Goal: to improve PT Goal Formulation: With patient/family Time For Goal Achievement: 03/16/23 Potential to Achieve Goals: Good    Frequency Min 1X/week     Co-evaluation               AM-PAC PT "6 Clicks" Mobility  Outcome Measure Help needed turning from your back to your side while in a flat bed without using bedrails?: A Little Help needed moving from lying on your back to sitting on the side of a flat bed without using bedrails?: A Little Help needed moving to and from a bed to a chair (including a wheelchair)?: A Little Help needed standing up from a chair using your arms (e.g., wheelchair or bedside chair)?: A Little Help needed to walk in hospital room?: A Little Help needed climbing 3-5 steps with a railing? : A Little 6 Click Score: 18    End of Session Equipment Utilized During Treatment: Gait belt;Oxygen Activity Tolerance: Patient tolerated treatment well Patient left: in chair;with call bell/phone within reach;with chair alarm set;with family/visitor present   PT Visit Diagnosis: Unsteadiness on feet (R26.81);Other abnormalities of gait and mobility (R26.89);Muscle weakness (generalized) (M62.81);Difficulty in walking, not elsewhere classified (R26.2)    Time: 1610-9604 PT Time Calculation (min) (ACUTE ONLY): 28 min   Charges:   PT Evaluation $PT Eval Moderate Complexity: 1 Mod PT Treatments $Therapeutic Activity: 8-22 mins PT General Charges $$ ACUTE PT VISIT: 1 Visit         Virgil Benedict, PT, DPT Acute Rehabilitation Services  Office: [phone]   Mary Mccarthy 03/02/2023, 5:30 PM

## 2023-03-03 ENCOUNTER — Other Ambulatory Visit (HOSPITAL_COMMUNITY): Payer: Self-pay

## 2023-03-03 DIAGNOSIS — I700 Atherosclerosis of aorta: Secondary | ICD-10-CM | POA: Diagnosis not present

## 2023-03-03 DIAGNOSIS — I5033 Acute on chronic diastolic (congestive) heart failure: Secondary | ICD-10-CM | POA: Diagnosis not present

## 2023-03-03 DIAGNOSIS — E1169 Type 2 diabetes mellitus with other specified complication: Secondary | ICD-10-CM | POA: Diagnosis not present

## 2023-03-03 DIAGNOSIS — N1832 Chronic kidney disease, stage 3b: Secondary | ICD-10-CM | POA: Diagnosis not present

## 2023-03-03 LAB — RENAL FUNCTION PANEL
Albumin: 2.3 g/dL — ABNORMAL LOW (ref 3.5–5.0)
Anion gap: 6 (ref 5–15)
BUN: 21 mg/dL (ref 8–23)
CO2: 28 mmol/L (ref 22–32)
Calcium: 8.2 mg/dL — ABNORMAL LOW (ref 8.9–10.3)
Chloride: 97 mmol/L — ABNORMAL LOW (ref 98–111)
Creatinine, Ser: 2 mg/dL — ABNORMAL HIGH (ref 0.44–1.00)
GFR, Estimated: 24 mL/min — ABNORMAL LOW (ref >60)
Glucose, Bld: 150 mg/dL — ABNORMAL HIGH (ref 70–99)
Phosphorus: 3 mg/dL (ref 2.5–4.6)
Potassium: 4.4 mmol/L (ref 3.5–5.1)
Sodium: 131 mmol/L — ABNORMAL LOW (ref 135–145)

## 2023-03-03 LAB — GLUCOSE, CAPILLARY
Glucose-Capillary: 152 mg/dL — ABNORMAL HIGH (ref 70–99)
Glucose-Capillary: 200 mg/dL — ABNORMAL HIGH (ref 70–99)

## 2023-03-03 LAB — MAGNESIUM: Magnesium: 2.2 mg/dL (ref 1.7–2.4)

## 2023-03-03 MED ORDER — HYDRALAZINE HCL 25 MG PO TABS
25.0000 mg | ORAL_TABLET | Freq: Three times a day (TID) | ORAL | 0 refills | Status: DC
  Filled 2023-03-03: qty 90, 30d supply, fill #0

## 2023-03-03 MED ORDER — FUROSEMIDE 20 MG PO TABS
60.0000 mg | ORAL_TABLET | Freq: Every day | ORAL | 0 refills | Status: DC
  Filled 2023-03-03: qty 90, 30d supply, fill #0

## 2023-03-03 MED ORDER — FUROSEMIDE 40 MG PO TABS: 60.0000 mg | ORAL_TABLET | Freq: Every day | ORAL | Status: DC

## 2023-03-03 MED ORDER — ISOSORBIDE MONONITRATE ER 30 MG PO TB24
15.0000 mg | ORAL_TABLET | Freq: Every day | ORAL | 0 refills | Status: DC
  Filled 2023-03-03: qty 30, 60d supply, fill #0

## 2023-03-03 NOTE — Progress Notes (Signed)
Mobility Specialist Progress Note:    03/03/23 1214  Mobility  Activity Ambulated with assistance in hallway  Level of Assistance Contact guard assist, steadying assist  Assistive Device Front wheel walker  Distance Ambulated (ft) 200 ft  Activity Response Tolerated well  Mobility Referral Yes  Mobility visit 1 Mobility  Mobility Specialist Start Time (ACUTE ONLY) 0900  Mobility Specialist Stop Time (ACUTE ONLY) 0915  Mobility Specialist Time Calculation (min) (ACUTE ONLY) 15 min   Pt received in chair agreeable to mobility. No physical assistance needed during session. Took x3 standing rest breaks d/t fatigue but no c/o SOB. Ambulated on 3L/min Spo2 WFL. Returned to room w/o fault. Call bell and personal belongings in reach. All needs met. Left on 2L/min.    Thompson Grayer Mobility Specialist  Please contact vis Secure Chat or  Rehab Office [phone]

## 2023-03-03 NOTE — Progress Notes (Addendum)
O2 sat at rest without oxygen: 88% Ambulated patient from room to nurses' station. O2 sat prior to walking: 94% on 2L [place]. O2 sat decreased to 87% while ambulating on 2L [place]. Back to 91% on 2L once resting; patient placed on 3L [place] and O2 sat >95%.

## 2023-03-03 NOTE — Evaluation (Signed)
Occupational Therapy Evaluation Patient Details Name: Sunshyne Horvath MRN: [id number] DOB: 1940/05/29 Today's Date: 03/03/2023   History of Present Illness Pt is an 83 y.o. female who presented 02/28/23 with SOB. Admitted with CHF exacerbation. PMH: CVA, CAD, HTN, HLD, paroxysmal afib, DM2, anemia, aortic stenosis s/p TAVR, osteoporosis, GERD, CKD stage IIIb   Clinical Impression   Pt admitted for above, she presents with  decreased activity tolerance from her baseline and displays need for supplemental 02 when ambulatory. Pt currently completing Adls with CGA to setup assist. Pt generally weak in BUE/BLEs and would benefit from further conditioning. OT to continue following pt acutely to address listed deficits and help transition to next level of care. Patient would benefit from post acute Home OT services to help maximize functional independence in natural environment       If plan is discharge home, recommend the following: A little help with bathing/dressing/bathroom;Assistance with cooking/housework;Assist for transportation;Help with stairs or ramp for entrance    Functional Status Assessment  Patient has had a recent decline in their functional status and demonstrates the ability to make significant improvements in function in a reasonable and predictable amount of time.  Equipment Recommendations  BSC/3in1 (If son cannot procure BSC from aunt's home)    Recommendations for Other Services       Precautions / Restrictions Precautions Precautions: Fall;Other (comment) Precaution Comments: watch SpO2 Restrictions Weight Bearing Restrictions Per Provider Order: No      Mobility Bed Mobility               General bed mobility comments: Pt OOB with RN on arrival    Transfers Overall transfer level: Needs assistance Equipment used: Rolling walker (2 wheels) Transfers: Sit to/from Stand Sit to Stand: Contact guard assist           General transfer comment: CGA STS  from recliner      Balance Overall balance assessment: Needs assistance Sitting-balance support: No upper extremity supported, Feet supported Sitting balance-[name]: Fair     Standing balance support: Bilateral upper extremity supported, During functional activity, Reliant on assistive device for balance Standing balance-[name]: Poor Standing balance comment: Reliant on RW                           ADL either performed or assessed with clinical judgement   ADL Overall ADL's : Needs assistance/impaired Eating/Feeding: Independent;Sitting   Grooming: Standing;Contact guard assist   Upper Body Bathing: Standing;Contact guard assist   Lower Body Bathing: Sitting/lateral leans;Set up   Upper Body Dressing : Set up   Lower Body Dressing: Set up;Supervision/safety;Sitting/lateral leans Lower Body Dressing Details (indicate cue type and reason): doff/don socks Toilet Transfer: Contact guard assist;Ambulation;Rolling walker (2 wheels)   Toileting- Clothing Manipulation and Hygiene: Supervision/safety;Sitting/lateral lean       Functional mobility during ADLs: Contact guard assist;Rolling walker (2 wheels)       Vision         Perception         Praxis         Pertinent Vitals/Pain Pain Assessment Pain Assessment: No/denies pain     Extremity/Trunk Assessment Upper Extremity Assessment Upper Extremity Assessment: Generalized weakness   Lower Extremity Assessment Lower Extremity Assessment: Generalized weakness   Cervical / Trunk Assessment Cervical / Trunk Assessment: Kyphotic   Communication Communication Communication: No apparent difficulties   Cognition Arousal: Alert Behavior During Therapy: WFL for tasks assessed/performed Overall Cognitive Status: Within  Functional Limits for tasks assessed                                       General Comments  Sp02 desat to 88% when ambulating in hall on 2L, returned back to  >91% with rest.    Exercises     Shoulder Instructions      Home Living Family/patient expects to be discharged to:: Private residence Living Arrangements: Children (son) Available Help at Discharge: Family;Available 24 hours/day Type of Home: House Home Access: Stairs to enter [employer] of Steps: 1 Entrance Stairs-Rails: Can reach both Home Layout: One level     Bathroom Shower/Tub: Producer, television/film/video: Handicapped height     Home Equipment: Agricultural consultant (2 wheels);Rollator (4 wheels);Grab bars - tub/shower;Shower seat;Cane - quad (nephew can bring the [facility] to pt if needed, not currently in the house though)          Prior Functioning/Environment Prior Level of Function : Independent/Modified Independent             Mobility Comments: Uses rollator inside home, QC outside home ADLs Comments: no longer drives, dresses herself, bathes herself in standing while holding onto grab bar        OT Problem List: Decreased strength;Decreased activity tolerance;Cardiopulmonary status limiting activity      OT Treatment/Interventions: Therapeutic activities;DME and/or AE instruction;Self-care/ADL training;Balance training;Patient/family education;Therapeutic exercise    OT Goals(Current goals can be found in the care plan section) Acute Rehab OT Goals Patient Stated Goal: To go home OT Goal Formulation: With patient Time For Goal Achievement: 03/17/23 Potential to Achieve Goals: Good ADL Goals Pt Will Perform Grooming: with supervision;standing Pt Will Perform Lower Body Dressing: with supervision;sit to/from stand Pt Will Transfer to Toilet: with supervision;ambulating  OT Frequency: Min 1X/week    Co-evaluation              AM-PAC OT "6 Clicks" Daily Activity     Outcome Measure Help from another person eating meals?: None Help from another person taking care of personal grooming?: A Little Help from another person toileting,  which includes using toliet, bedpan, or urinal?: A Little Help from another person bathing (including washing, rinsing, drying)?: A Little Help from another person to put on and taking off regular upper body clothing?: A Little Help from another person to put on and taking off regular lower body clothing?: A Little 6 Click Score: 19   End of Session Equipment Utilized During Treatment: Gait belt;Rolling walker (2 wheels) Nurse Communication: Mobility status  Activity Tolerance: Patient tolerated treatment well Patient left: in chair;with call bell/phone within reach  OT Visit Diagnosis: Muscle weakness (generalized) (M62.81);Unsteadiness on feet (R26.81)                Time: 1610-9604 OT Time Calculation (min): 11 min Charges:  OT General Charges $OT Visit: 1 Visit OT Evaluation $OT Eval Low Complexity: 1 Low  03/03/2023  AB, OTR/L  Acute Rehabilitation Services  Office: [phone]   Tristan Schroeder 03/03/2023, 12:32 PM

## 2023-03-03 NOTE — Discharge Summary (Signed)
Physician Discharge Summary   Patient: Mary Mccarthy MRN: [id number] DOB: 18-Sep-1940  Admit date:     02/28/2023  Discharge date: 03/03/23  Discharge Physician: York Ram Kytzia Gienger   PCP: Corwin Levins, MD   Recommendations at discharge:    Patient has been placed on hydralazine and isosorbide for after load reduction.  Holding losartan and K supplements due to hyperkalemia and acute reduction of GFR per serum cr Furosemide increased to 60 mg po daily, to start on 03/05/23 Follow up renal function and electrolytes in 7 days as outpatient.  Follow up with Dr Jonny Ruiz in 7 to 10 days.  Follow up with Cardiology as scheduled.   Discharge Diagnoses: Principal Problem:   Acute on chronic diastolic CHF (congestive heart failure) (HCC) Active Problems:   CKD (chronic kidney disease), stage III (HCC)   Hyperlipidemia associated with type 2 diabetes mellitus (HCC)   Aortic atherosclerosis (HCC)   PAF with RVR: CHA2DS2-VASc 9.  On amiodarone.   Anxiety   History of CVA (cerebrovascular accident)   GERD   Hypothyroidism  Resolved Problems:   * No resolved hospital problems. [facility] Course: Mary Mccarthy was admitted to the hospital with the working diagnosis of heart failure exacerbation.   83 yo female with the past medical history of paroxysmal atrial fibrillation, obesity, recurrent GI bleeding, severe aortic stenosis sp TAVR, complicated with transient heart block, hypertension, coronary artery disease and hyperlipidemia who presented with dyspnea. Patient has been not adherent to a fluid and salt restricted diet, she has been taking furosemide only as needed. She developed progressive dyspnea and lower extremity edema. On her initial physical examination her blood pressure was 185/70, HR 58, RR 29 and 02 saturation 90%, lungs with no wheezing or rhonchi, positive rales, heart with S1 and S2 present and regular, with no gallops, rubs or murmurs, abdomen with no distention and positive  lower extremity edema.   Na 137, K 5.9 Cl 111, bicarbonate 22, glucose 122, bun 16 cr 1,40  BNP 594 High sensitive troponin 20 and 20  Wbc 5,4 hgb 9,.7 plt 250  Sars covid 19 negative Influenza negative   Chest radiograph with cardiomegaly, bilateral hilar vascular congestion, cephalization of the vasculature, bilateral central interstitial infiltrates, small bilateral pleural effusions.   EKG 55 bpm, normal axis, normal intervals, sinus rhythm with poor R R wave progression with no significant ST segment or T wave changes.   01/24 responding well to diuresis,  01/25 patient euvolemic on clinical examination, she has 02 saturation down to 83% on room air on ambulation.   She had home 02 before but was afraid of using it.  I explained the benefits of supplemental 02 and she has agreed to use it this time.  Home health services, follow up as outpatient.   Assessment and Plan: * Acute on chronic diastolic CHF (congestive heart failure) (HCC) 10/24 echocardiogram with preserved LV systolic function with EF 60 to 65%, mild LVH, LA with severe dilatation, sp TAVR with perivalvular leak. RVSP 51.7 mmHg  Acute on chronic core pulmonale Pulmonary hypertension.   Patient was placed on IV furosemide, negative fluid balance was achieved, - 4,098 ml, with significant improvement in her symptoms.   Continue with hydralazine and isosorbide for after load reduction.  Continue furosemide 60 mg po daily to start on 01/27.  Resume low dose metoprolol.  Holding RAAS due to acutely low GFR.   Acute hypoxemic respiratory failure due to acute cardiogenic pulmonary edema combined with pulmonary  hypertension.  Patient with 02 saturation on room air at rest 99%, on ambulation 83% and up to 90% on 3 L/min per [facility].  Will add home supplemental 02.   CKD (chronic kidney disease), stage III (HCC) Stage 3b CKD, hyponatremia hyperkalemia.  Baseline serum cr 1,5 to 2,0   Her volume status has improved, renal  function today with serum cr at 2,0, K is 4.4 and serum bicarbonate at 28  Na 131   Patient will hold on furosemide until Monday.  Will need outpatient follow up renal function and electrolytes in 7 days.   Hyperlipidemia associated with type 2 diabetes mellitus (HCC) Her glucose remained stable, she was placed on insulin sliding scale while hospitalized.  Fasting glucose at the time of discharge is 150 mg/dl. Continue statin therapy.   Aortic atherosclerosis (HCC) Continue blood pressure control   PAF with RVR: CHA2DS2-VASc 9.  On amiodarone. Continue rhythm control with amiodarone.  Not on anticoagulation due to recurrent gastro intestinal bleeding.   Anxiety Continue as needed alprazolam and citalopram.   History of CVA (cerebrovascular accident) Continue blood pressure control  She is not on anticoagulation for atrial fibrillation due to recurrent GI bleeding.   GERD Continue with pantoprazole   Hypothyroidism Continue with levothyroxine          Consultants: none  Procedures performed: none   Disposition: Home Diet recommendation:  Cardiac and Carb modified diet DISCHARGE MEDICATION: Allergies as of 03/03/2023       Reactions   Ibuprofen Other (See Comments)   Bleeding events        Medication List     STOP taking these medications    amLODipine 10 MG tablet Commonly known as: NORVASC   Klor-Con M20 20 MEQ tablet Generic drug: potassium chloride SA   losartan 100 MG tablet Commonly known as: COZAAR       TAKE these medications    acetaminophen 500 MG tablet Commonly known as: TYLENOL Take 500 mg by mouth every 6 (six) hours as needed for moderate pain.   ALPRAZolam 0.25 MG tablet Commonly known as: XANAX TAKE 1 TABLET BY MOUTH THREE TIMES A DAY AS NEEDED FOR ANXIETY   amiodarone 200 MG tablet Commonly known as: PACERONE TAKE 1 TABLET BY MOUTH EVERY DAY   atorvastatin 40 MG tablet Commonly known as: LIPITOR TAKE 1 TABLET BY MOUTH  EVERY DAY   blood glucose meter kit and supplies Dispense based on patient and insurance preference. Use up to four times daily as directed. E11.9   citalopram 20 MG tablet Commonly known as: CELEXA Take 1 tablet (20 mg total) by mouth daily. What changed: how much to take   [employer] VITAMIN B12 1000 MCG tablet Generic drug: cyanocobalamin TAKE 1 TABLET BY MOUTH EVERY DAY   furosemide 20 MG tablet Commonly known as: LASIX Take 3 tablets (60 mg total) by mouth daily. Start taking on: March 05, 2023 What changed:  medication strength how much to take These instructions start on March 05, 2023. If you are unsure what to do until then, ask your doctor or other care provider.   hydrALAZINE 25 MG tablet Commonly known as: APRESOLINE Take 1 tablet (25 mg total) by mouth every 8 (eight) hours.   isosorbide mononitrate 30 MG 24 hr tablet Commonly known as: IMDUR Take 0.5 tablets (15 mg total) by mouth daily. Start taking on: March 04, 2023   levothyroxine 25 MCG tablet Commonly known as: SYNTHROID TAKE 1 TABLET BY MOUTH EVERY DAY  metFORMIN 1000 MG tablet Commonly known as: GLUCOPHAGE TAKE 1 TABLET BY MOUTH IN THE AM AND 1/2 IN THE PM   metoprolol succinate 25 MG 24 hr tablet Commonly known as: TOPROL-XL TAKE 1/2 TABLET BY MOUTH EVERY DAY   mirtazapine 15 MG tablet Commonly known as: REMERON TAKE 1 TABLET BY MOUTH EVERYDAY AT BEDTIME   ONE TOUCH ULTRA 2 w/Device Kit Use as directed daily   OneTouch Delica Plus Lancet33G Misc USE AS DIRECTED TEST 1-2 TIMES A DAY   OneTouch Ultra test strip Generic drug: glucose blood USE AS INSTRUCTED TEST 1-2 TIMES A DAY   pantoprazole 40 MG tablet Commonly known as: PROTONIX TAKE 1 TABLET BY MOUTH TWICE A DAY   solifenacin 5 MG tablet Commonly known as: VESICARE Take 1 tablet (5 mg total) by mouth daily.               Durable Medical Equipment  (From admission, onward)           Start     Ordered   03/03/23  1136  For home use only DME oxygen  Once       Question Answer Comment  Length of Need 6 Months   Mode or (Route) Nasal cannula   Liters per Minute 3   Frequency Continuous (stationary and portable oxygen unit needed)   Oxygen conserving device Yes   Oxygen delivery system Gas      03/03/23 1135            Discharge Exam: Filed Weights   03/02/23 0028 03/02/23 0300 03/03/23 0323  Weight: 86.8 kg 86.8 kg 87 kg   BP (!) 122/42 (BP Location: Left Arm)   Pulse 63   Temp (!) 97.3 F (36.3 C) (Oral)   Resp 19   Ht 5\' 1"  (1.549 m)   Wt 87 kg   SpO2 96%   BMI 36.24 kg/m   Patient is feeling better, dyspnea has improved, no chest pain, no edema, PND or orthopnea   Neurology awake and alert ENT with mild pallor Cardiovascular with S1 and S2 present and regular with no gallops, or rubs, positive systolic murmur at the base No JVD No lower extremity edema Respiratory with no rales, rhonchi or wheezing Abdomen with no distention   Condition at discharge: stable  The results of significant diagnostics from this hospitalization (including imaging, microbiology, ancillary and laboratory) are listed below for reference.   Imaging Studies: DG Chest Portable 1 View Result Date: 02/28/2023 CLINICAL DATA:  Shortness of breath and hypoxia.  Influenza. EXAM: PORTABLE CHEST 1 VIEW COMPARISON:  Chest radiograph dated 07/09/2022. FINDINGS: There is cardiomegaly with vascular congestion and edema. Pneumonia is not excluded. Small bilateral pleural effusions. No pneumothorax. Aortic valve repair. No acute osseous pathology. IMPRESSION: Cardiomegaly with vascular congestion and edema. Electronically Signed   By: Elgie Collard M.D.   On: 02/28/2023 14:47    Microbiology: Results for orders placed or performed during the hospital encounter of 02/28/23  Resp panel by RT-PCR (RSV, Flu A&B, Covid) Anterior Nasal Swab     Status: None   Collection Time: 02/28/23  1:20 PM   Specimen: Anterior  Nasal Swab  Result Value Ref Range Status   SARS Coronavirus 2 by RT PCR NEGATIVE NEGATIVE Final   Influenza A by PCR NEGATIVE NEGATIVE Final   Influenza B by PCR NEGATIVE NEGATIVE Final    Comment: (NOTE) The Xpert Xpress SARS-CoV-2/FLU/RSV plus assay is intended as an aid in the diagnosis of  influenza from Nasopharyngeal swab specimens and should not be used as a sole basis for treatment. Nasal washings and aspirates are unacceptable for Xpert Xpress SARS-CoV-2/FLU/RSV testing.  Fact Sheet for Patients: BloggerCourse.com  Fact Sheet for Healthcare Providers: SeriousBroker.it  This test is not yet approved or cleared by the [place] FDA and has been authorized for detection and/or diagnosis of SARS-CoV-2 by FDA under an Emergency Use Authorization (EUA). This EUA will remain in effect (meaning this test can be used) for the duration of the COVID-19 declaration under Section 564(b)(1) of the Act, 21 U.S.C. section 360bbb-3(b)(1), unless the authorization is terminated or revoked.     Resp Syncytial Virus by PCR NEGATIVE NEGATIVE Final    Comment: (NOTE) Fact Sheet for Patients: BloggerCourse.com  Fact Sheet for Healthcare Providers: SeriousBroker.it  This test is not yet approved or cleared by the [place] FDA and has been authorized for detection and/or diagnosis of SARS-CoV-2 by FDA under an Emergency Use Authorization (EUA). This EUA will remain in effect (meaning this test can be used) for the duration of the COVID-19 declaration under Section 564(b)(1) of the Act, 21 U.S.C. section 360bbb-3(b)(1), unless the authorization is terminated or revoked.  Performed at [facility], [address], [place], [place] [address]     Labs: CBC: Recent Labs  Lab 02/28/23 1331 02/28/23 1958 03/01/23 0241  WBC 5.4 5.5 5.8  NEUTROABS 4.2  --   --   HGB 9.7*  9.5* 9.2*  HCT 32.0* 31.3* 30.0*  MCV 98.8 97.5 96.8  PLT 250 252 238   Basic Metabolic Panel: Recent Labs  Lab 02/28/23 1331 02/28/23 1918 02/28/23 1958 03/01/23 0241 03/02/23 0236 03/03/23 0240  NA 137 136  --  136 134* 131*  K 5.9* 5.0  --  5.0 4.6 4.4  CL 111 107  --  106 101 97*  CO2 22 22  --  23 26 28   GLUCOSE 122* 123*  --  98 111* 150*  BUN 16 15  --  15 18 21   CREATININE 1.40* 1.44* 1.52* 1.64* 1.94* 2.00*  CALCIUM 9.5 9.7  --  9.3 8.5* 8.2*  MG  --   --   --   --  1.5* 2.2  PHOS  --   --   --   --   --  3.0   Liver Function Tests: Recent Labs  Lab 02/28/23 1331 03/03/23 0240  AST 22  --   ALT 12  --   ALKPHOS 56  --   BILITOT 1.6*  --   PROT 6.9  --   ALBUMIN 3.0* 2.3*   CBG: Recent Labs  Lab 03/02/23 1131 03/02/23 1522 03/02/23 2046 03/03/23 0556 03/03/23 1114  GLUCAP 124* 157* 138* 152* 200*    Discharge time spent: greater than 30 minutes.  Signed: Coralie Keens, MD [facility] 03/03/2023

## 2023-03-03 NOTE — TOC Transition Note (Signed)
Transition of Care [facility]) - Discharge Note   Patient Details  Name: Mary Mccarthy MRN: [id number] Date of Birth: Nov 05, 1940  Transition of Care [facility] CM/SW Contact:  Lawerance Sabal, RN Phone Number: 03/03/2023, 12:27 PM   Clinical Narrative:     Patient to transition home today.  Home oxygen will be delivered to the room through [facility] for transportation home. Patient understands to call [employer] when she gets home to have concentrator set up.  She politely declined HH services.   Final next level of care: Home w Home Health Services Barriers to Discharge: No Barriers Identified   Patient Goals and CMS Choice Patient states their goals for this hospitalization and ongoing recovery are:: return home with son   Choice offered to / list presented to : NA      Discharge Placement                       Discharge Plan and Services Additional resources added to the After Visit Summary for   In-house Referral: NA Discharge Planning Services: CM Consult Post Acute Care Choice: NA          DME Arranged: Oxygen DME Agency: [employer] Date DME Agency Contacted: 03/03/23 Time DME Agency Contacted: 1227 Representative spoke with at DME Agency: Vaughan Basta HH Arranged: Refused HH          Social Drivers of Health (SDOH) Interventions SDOH Screenings   Food Insecurity: Food Insecurity Present (03/01/2023)  Housing: High Risk (03/01/2023)  Transportation Needs: No Transportation Needs (03/01/2023)  Utilities: At Risk (03/01/2023)  Alcohol Screen: Low Risk  (05/11/2022)  Depression (PHQ2-9): Low Risk  (09/25/2022)  Financial Resource Strain: Low Risk  (05/11/2022)  Physical Activity: Inactive (05/11/2022)  Social Connections: Socially Isolated (03/01/2023)  Stress: No Stress Concern Present (05/11/2022)  Tobacco Use: Low Risk  (02/28/2023)     Readmission Risk Interventions    11/04/2020   11:24 AM  Readmission Risk Prevention Plan  Transportation Screening Complete  PCP  or Specialist Appt within 3-5 Days Complete  HRI or Home Care Consult Complete  Social Work Consult for Recovery Care Planning/Counseling Not Complete  Palliative Care Screening Not Applicable  Medication Review Oceanographer) Complete

## 2023-03-03 NOTE — Plan of Care (Signed)

## 2023-03-05 ENCOUNTER — Telehealth: Payer: Self-pay | Admitting: *Deleted

## 2023-03-05 ENCOUNTER — Telehealth: Payer: Self-pay | Admitting: Cardiology

## 2023-03-05 NOTE — Patient Instructions (Signed)
Visit Information  Thank you for taking time to visit with me today. Please don't hesitate to contact me if I can be of assistance to you before our next scheduled telephone appointment.  Our next appointment is by telephone Wednesday, 03/14/23 at 1:00 pm  Please call the care guide team at [phone] if you need to cancel or reschedule your appointment.   Patient Goals/Self-Care Activities: Participate in Transition of Care Program/Attend TOC scheduled calls Take all medications as prescribed Attend all scheduled provider appointments Call provider office for new concerns or questions  Continue pacing activity to avoid episodes of shortness of breath Continue using home oxygen as prescribed Weigh yourself every day to stay on top of early fluid retention: write down your weights every day so you remember what it is from day to day- and take these values from home to your doctor appointments with you Continue to follow your established action plan for episodes of shortness of breath Eat a heart healthy and low salt diet Use assistive devices (your walker) as needed to prevent falls Please go over all of your medications with your care providers (doctors)  Caryl Pina, RN, BSN, Media planner  Transitions of Care  [facility]   [phone]: direct office    Following is a copy of your care plan:   Goals Addressed             This Visit's Progress    TOC 30-day Program Care Plan   On track    Current Barriers:  Medication management declined full medication review on initial TOC call: son manages most aspects of medications; is not present at home during [facility] call; patient unable to retrieve post-discharge AVS instructions to review Provider appointments facilitated HFU PCP OV for 03/14/23; patient needs cardiology provider appointment for HFU: confirmed she has contact information for cardiologist: she agrees to call/  schedule promptly Home Health services patient declined home health services post-hospital discharge Equipment/DME new to home O2 use; confirmed regularly uses assistive devices (walker) for ambulation Fragile state of health/ advanced age; good family support; patient is essentially independent in some self-care activities; family assists as indicated  RNCM Clinical Goal(s):  Patient will work with the Care Management team over the next 30 days to address Transition of Care Barriers: Medication Management Provider appointments take all medications exactly as prescribed and will call provider for medication related questions as evidenced by full medication review with patient and/ or son-caregiver during [facility] 30-day program weekly outreach phone calls attend all scheduled medical appointments: 03/14/23- PCP for hospital follow up; encouraged patient to schedule promptly with cardiology provider as evidenced by review of same with patient/ review of EHR/ collaboration with care providers as indicated, during [facility] 30-day program weekly outreach phone calls not experience hospital admission as evidenced by review of EMR. Hospital Admissions in last 6 months = 1  through collaboration with RN Care manager, provider, and care team.   Interventions: Evaluation of current treatment plan related to  self management and patient's adherence to plan as established by provider  Transitions of Care:  New goal. 03/05/23 Durable Medical Equipment (DME) needs assessed with patient/caregiver Doctor Visits  - discussed the importance of doctor visits Arranged PCP follow-up within 12-14 days (Care Guide Scheduled) Post discharge activity limitations prescribed by provider reviewed Confirmed uses assistive devices on regular basis, at baseline -- walker Provided education around benefit of conservative post-hospital discharge activity; need to pace activity  without over-doing  Confirms using home O2 as per hospital  discharge instructions: 2-3 L/min; confirmed she has had home O2 in the past, is familiar with use of home O2 Reinforced previously provided education around safe use/ maintenance of home O2- worked with patient in the past Reinforced previously provided education around basics of following heart healthy low salt diet Confirmed patient is NOT currently obtaining daily weights at home: she states her batteries have "gone out" on current scales at home- states her nephew is bringing "new scale system" to her home this evening and reports she will resume monitoring/ recording daily weights at home at that time Reinforced previously provided education / rationale for daily weight monitoring at home as earliest indicator of fluid retention, along with weight gain guidelines/ action plan for weight gain; importance of taking diuretic as prescribed Advised patient to take all recorded weights from home monitoring to upcoming cardiology provider office visit for provider review Provided education around (late) signs and symptoms (other than weight gain) that indicate fluid retention along with action plan for same Successfully enrolled into 30-day TOC program   Patient Goals/Self-Care Activities: Participate in Transition of Care Program/Attend TOC scheduled calls Take all medications as prescribed Attend all scheduled provider appointments Call provider office for new concerns or questions  Continue pacing activity to avoid episodes of shortness of breath Continue using home oxygen as prescribed Weigh yourself every day to stay on top of early fluid retention: write down your weights every day so you remember what it is from day to day- and take these values from home to your doctor appointments with you Continue to follow your established action plan for episodes of shortness of breath Eat a heart healthy and low salt diet Use assistive devices (your walker) as needed to prevent falls Please go over  all of your medications with your care providers (doctors)  Follow Up Plan:  Telephone follow up appointment with care management team member scheduled for:  Wednesday, 03/14/23 at 1:00 pm          The patient verbalized understanding of instructions, educational materials, and care plan provided today and DECLINED offer to receive copy of patient instructions, educational materials, and care plan.   Telephone follow up appointment with care management team member scheduled for:  Wednesday, 03/14/23 at 1:00 pm  If you are experiencing a Mental Health or Behavioral Health Crisis or need someone to talk to, please  call the Suicide and Crisis Lifeline: 988 call the [place] National Suicide Prevention Lifeline: 605-200-4905 or TTY: (469) 811-6441 TTY 709-685-7611) to talk to a trained counselor call 1-800-273-TALK (toll free, 24 hour hotline) go to [facility] [address] [place] [phone]) call the [facility] Crisis Line: [phone] call 911

## 2023-03-05 NOTE — Telephone Encounter (Signed)
Left message to call back

## 2023-03-05 NOTE — Telephone Encounter (Signed)
Patient states 4 of her medications were changed while she was in the hospital and she would like to speak with Dr. Elissa Hefty nurse to discuss further.

## 2023-03-05 NOTE — Transitions of Care (Post Inpatient/ED Visit) (Signed)
03/05/2023  Name: Mary Mccarthy MRN: [id number] DOB: Mar 18, 1940  Today's TOC FU Call Status: Today's TOC FU Call Status:: Successful TOC FU Call Completed TOC FU Call Complete Date: 03/05/23 Patient's Name and Date of Birth confirmed.  Transition Care Management Follow-up Telephone Call Date of Discharge: 03/03/23 Discharge Facility: [facility]) Type of Discharge: Inpatient Admission Primary Inpatient Discharge Diagnosis:: Acute on Chronic CHF exacerbation How have you been since you were released from the hospital?: Better ("I am better but very weak and tired.  I got the home oxygen and am using it.  My son is helping me with my medicine; he is out working on his car and I am not near the hospital papers, so i can't go over them with you today") Any questions or concerns?: No  Items Reviewed: Did you receive and understand the discharge instructions provided?: Yes (briefly reviewed with patient who verbalizes fair understanding of same - she is not near hospital discharge papers/ AVS and reports she is unable to retrieve during our phone call) Medications obtained,verified, and reconciled?: No (patient declined) Medications Not Reviewed Reasons:: Other: (patient declines medication review today: she was enrolled into [facility] 30-day program and is agreeable to review next week) Any new allergies since your discharge?: No Dietary orders reviewed?: Yes Type of Diet Ordered:: Heart Healthy low salt Do you have support at home?: Yes People in Home: child(ren), adult Name of Support/Comfort Primary Source: Reports independent in some self-care activities; requires minimal assistance with "most" self-care activities; resides with supportive son assists as/ if needed/ indicated  Medications Reviewed Today: Medications Reviewed Today     Reviewed by Michaela Corner, RN (Registered Nurse) on 03/05/23 at 1528  Med List Status: <None>   Medication Order Taking? Sig Documenting  Provider Last Dose Status Informant  acetaminophen (TYLENOL) 500 MG tablet [id number] No Take 500 mg by mouth every 6 (six) hours as needed for moderate pain. [provider] 02/27/2023 Evening Active Self           Med Note Marilu Favre Mar 05, 2023  3:24 PM) 03/05/23: patient declines medication review during [facility] call today: she was enrolled into [facility] 30-day program and is agreeable to review next week  ALPRAZolam (XANAX) 0.25 MG tablet [id number] No TAKE 1 TABLET BY MOUTH THREE TIMES A DAY AS NEEDED FOR ANXIETY Corwin Levins, MD Past Week Active Self  amiodarone (PACERONE) 200 MG tablet [id number] No TAKE 1 TABLET BY MOUTH EVERY Su Monks, MD 02/27/2023 Morning Active Self  atorvastatin (LIPITOR) 40 MG tablet [id number] No TAKE 1 TABLET BY MOUTH EVERY DAY [name], MD 02/27/2023 Morning Active Self  blood glucose meter kit and supplies [id number] No Dispense based on patient and insurance preference. Use up to four times daily as directed. E11.9 Corwin Levins, MD Taking Active Self  Blood Glucose Monitoring Suppl (ONE TOUCH ULTRA 2) w/Device Andria Rhein [id number] No Use as directed daily Corwin Levins, MD Taking Active Self  citalopram (CELEXA) 20 MG tablet [id number] No Take 1 tablet (20 mg total) by mouth daily.  Patient taking differently: Take 10 mg by mouth daily.   Corwin Levins, MD 02/27/2023 Morning Active Self  [employer] VITAMIN B12 1000 MCG tablet [id number] No TAKE 1 TABLET BY MOUTH EVERY DAY Corwin Levins, MD 02/27/2023 Morning Active Self  furosemide (LASIX) 20 MG tablet [id number]  Take 3 tablets (60 mg total) by mouth daily. Arrien, York Ram, MD  Active   hydrALAZINE (APRESOLINE) 25 MG tablet [id number]  Take 1 tablet (25 mg total) by mouth every 8 (eight) hours. Arrien, York Ram, MD  Active   isosorbide mononitrate (IMDUR) 30 MG 24 hr tablet [id number]  Take 0.5 tablets (15 mg total) by mouth daily. Arrien, York Ram, MD  Active   Lancets [facility] [place]  PLUS [place]) MISC [id number] No USE AS DIRECTED TEST 1-2 TIMES A DAY Corwin Levins, MD Taking Active Self  levothyroxine (SYNTHROID) 25 MCG tablet [id number] No TAKE 1 TABLET BY MOUTH EVERY DAY Corwin Levins, MD 02/27/2023 Morning Active Self  metFORMIN (GLUCOPHAGE) 1000 MG tablet [id number] No TAKE 1 TABLET BY MOUTH IN THE AM AND 1/2 IN THE PM Corwin Levins, MD 02/27/2023 Morning Active Self  metoprolol succinate (TOPROL-XL) 25 MG 24 hr tablet [id number] No TAKE 1/2 TABLET BY MOUTH EVERY DAY Corwin Levins, MD 02/27/2023 Morning Active Self  mirtazapine (REMERON) 15 MG tablet [id number] No TAKE 1 TABLET BY MOUTH EVERYDAY AT BEDTIME Corwin Levins, MD 02/27/2023 Bedtime Active Self  ONETOUCH ULTRA test strip [id number] No USE AS INSTRUCTED TEST 1-2 TIMES A DAY Corwin Levins, MD Taking Active Self  pantoprazole (PROTONIX) 40 MG tablet [id number] No TAKE 1 TABLET BY MOUTH TWICE A DAY Janetta Hora, PA-C 02/27/2023 Morning Active Self  solifenacin (VESICARE) 5 MG tablet [id number] No Take 1 tablet (5 mg total) by mouth daily. Corwin Levins, MD 02/27/2023 Morning Active Self           Home Care and Equipment/Supplies: Were Home Health Services Ordered?: No (recommended- patient refused) Any new equipment or medical supplies ordered?: Yes (home O2) Name of Medical supply agency?: [employer] Were you able to get the equipment/medical supplies?: Yes Do you have any questions related to the use of the equipment/supplies?: No  Functional Questionnaire: Do you need assistance with bathing/showering or dressing?: Yes (son assists as indicated) Do you need assistance with meal preparation?: Yes (son assists as indicated) Do you need assistance with eating?: No Do you have difficulty maintaining continence: No Do you need assistance with getting out of bed/getting out of a chair/moving?: Yes (son assists as indicated) Do you have difficulty managing or taking your medications?: Yes (son assists with medication  management)  Follow up appointments reviewed: PCP Follow-up appointment confirmed?: Yes (care coordination outreach in real-time with scheduling care guide to successfully schedule hospital follow up PCP appointment 03/14/23) Date of PCP follow-up appointment?: 03/14/23 Follow-up Provider: PCP Specialist Hospital Follow-up appointment confirmed?: No Reason Specialist Follow-Up Not Confirmed: Patient has Specialist Provider Number and will Call for Appointment (encouraged patient to call cardiology provider to schedule prompt HFU OV- she verbalizes understanding/ agreement) Do you need transportation to your follow-up appointment?: No Do you understand care options if your condition(s) worsen?: Yes-patient verbalized understanding  SDOH Interventions Today    Flowsheet Row Most Recent Value  SDOH Interventions   Food Insecurity Interventions Intervention Not Indicated  [Today, patient denies food insecurity: she confirms she received printed resources on AVS from hospital discharge on 03/03/23]  Housing Interventions Other (Comment)  [Today, patient denies housing concerns-- however- she confirms she received printed resources on AVS from hospital discharge on 03/03/23]  Transportation Interventions Intervention Not Indicated  [son/ nephew provide transportation]  Utilities Interventions Other (Comment)  [Today, patient denies utility insecurity: she confirms she has heat/ water,  confirms she received printed resources on AVS from hospital discharge on 03/03/23]       Goals  Addressed             This Visit's Progress    TOC 30-day Program Care Plan   On track    Current Barriers:  Medication management declined full medication review on initial TOC call: son manages most aspects of medications; is not present at home during [facility] call; patient unable to retrieve post-discharge AVS instructions to review Provider appointments facilitated HFU PCP OV for 03/14/23; patient needs cardiology  provider appointment for HFU: confirmed she has contact information for cardiologist: she agrees to call/ schedule promptly Home Health services patient declined home health services post-hospital discharge Equipment/DME new to home O2 use; confirmed regularly uses assistive devices (walker) for ambulation Fragile state of health/ advanced age; good family support; patient is essentially independent in some self-care activities; family assists as indicated  RNCM Clinical Goal(s):  Patient will work with the Care Management team over the next 30 days to address Transition of Care Barriers: Medication Management Provider appointments take all medications exactly as prescribed and will call provider for medication related questions as evidenced by full medication review with patient and/ or son-caregiver during [facility] 30-day program weekly outreach phone calls attend all scheduled medical appointments: 03/14/23- PCP for hospital follow up; encouraged patient to schedule promptly with cardiology provider as evidenced by review of same with patient/ review of EHR/ collaboration with care providers as indicated, during [facility] 30-day program weekly outreach phone calls not experience hospital admission as evidenced by review of EMR. Hospital Admissions in last 6 months = 1  through collaboration with RN Care manager, provider, and care team.   Interventions: Evaluation of current treatment plan related to  self management and patient's adherence to plan as established by provider  Transitions of Care:  New goal. 03/05/23 Durable Medical Equipment (DME) needs assessed with patient/caregiver Doctor Visits  - discussed the importance of doctor visits Arranged PCP follow-up within 12-14 days (Care Guide Scheduled) Post discharge activity limitations prescribed by provider reviewed Confirmed uses assistive devices on regular basis, at baseline -- walker Provided education around benefit of conservative  post-hospital discharge activity; need to pace activity without over-doing  Confirms using home O2 as per hospital discharge instructions: 2-3 L/min; confirmed she has had home O2 in the past, is familiar with use of home O2 Reinforced previously provided education around safe use/ maintenance of home O2- worked with patient in the past Reinforced previously provided education around basics of following heart healthy low salt diet Confirmed patient is NOT currently obtaining daily weights at home: she states her batteries have "gone out" on current scales at home- states her nephew is bringing "new scale system" to her home this evening and reports she will resume monitoring/ recording daily weights at home at that time Reinforced previously provided education / rationale for daily weight monitoring at home as earliest indicator of fluid retention, along with weight gain guidelines/ action plan for weight gain; importance of taking diuretic as prescribed Advised patient to take all recorded weights from home monitoring to upcoming cardiology provider office visit for provider review Provided education around (late) signs and symptoms (other than weight gain) that indicate fluid retention along with action plan for same Successfully enrolled into 30-day TOC program   Patient Goals/Self-Care Activities: Participate in Transition of Care Program/Attend TOC scheduled calls Take all medications as prescribed Attend all scheduled provider appointments Call provider office for new concerns or questions  Continue pacing activity to avoid episodes of shortness of breath Continue using home  oxygen as prescribed Weigh yourself every day to stay on top of early fluid retention: write down your weights every day so you remember what it is from day to day- and take these values from home to your doctor appointments with you Continue to follow your established action plan for episodes of shortness of breath Eat  a heart healthy and low salt diet Use assistive devices (your walker) as needed to prevent falls Please go over all of your medications with your care providers (doctors)  Follow Up Plan:  Telephone follow up appointment with care management team member scheduled for:  Wednesday, 03/14/23 at 1:00 pm          Caryl Pina, RN, BSN, CCRN Alumnus RN Care Manager  Transitions of Care  [facility] [phone]: direct office

## 2023-03-06 NOTE — Telephone Encounter (Signed)
Patient identification verified by 2 forms. Marilynn Rail, RN    Called and spoke to patient  Patient states:   -has questions about medications from discharge  -recently discharged on 1/25  -does not have follow up appointment scheduled with cardiology   -has follow up with PCP on 2/5 Informed patient:   -Per discharge summary stop: amlodipine, potassium, losartan   -Start: Lasix 60mg  (3tabs), Imdur 15mg  (0.5tab)  -continue: Amiodarone 200mg , Atorvastatin 40mg , citalopram 20mg , hydralazine 25mg  Q8, levothyroxine , metformin 1000mg  AM &500mg  PM, Metoprolol 12.5mg  (1/2 tab), Mirazapine 15mg  at night, solifenacin 5mg    Patient scheduled for OV 1/31 With [facility] at 10:55am Advised patient to bring medications to OV  Patient verbalized understanding, no questions at this time

## 2023-03-09 ENCOUNTER — Ambulatory Visit: Payer: 59 | Admitting: Cardiology

## 2023-03-09 ENCOUNTER — Telehealth: Payer: Self-pay

## 2023-03-09 ENCOUNTER — Telehealth: Payer: Self-pay | Admitting: *Deleted

## 2023-03-09 DIAGNOSIS — I509 Heart failure, unspecified: Secondary | ICD-10-CM

## 2023-03-09 NOTE — Progress Notes (Unsigned)
Complex Care Management Note Care Guide Note  03/09/2023 Name: Kamillah Didonato MRN: [id number] DOB: [birth year]   Complex Care Management Outreach Attempts: An unsuccessful telephone outreach was attempted today to offer the patient information about available complex care management services.  Follow Up Plan:  Additional outreach attempts will be made to offer the patient complex care management information and services.   Encounter Outcome:  No Answer  Burman Nieves, CMA, Care Guide [facility]  [facility], [facility] Guide Direct Dial: [phone]  Fax: [phone] Website: [email or web address]

## 2023-03-12 ENCOUNTER — Ambulatory Visit: Payer: 59 | Admitting: Nurse Practitioner

## 2023-03-12 NOTE — Progress Notes (Deleted)
 Office Visit    Patient Name: Mary Mccarthy Date of Encounter: 03/12/2023  Primary Care Provider:  Corwin Levins, MD Primary Cardiologist:  Bryan Lemma, MD  Chief Complaint    83 year old female with a history of severe aortic stenosis s/p TAVR, transient post TAVR complete heart block, nonobstructive CAD, paroxysmal atrial fibrillation, chronic diastolic heart failure, CVA, recurrent GI bleed, hypertension, hyperlipidemia, type 2 diabetes, CKD stage III, hypothyroidism, GERD, and anxiety who presents for hospital follow-up related to heart failure.  Past Medical History    Past Medical History:  Diagnosis Date   ALLERGIC RHINITIS 06/24/2007   Anemia    AVM (arteriovenous malformation) of colon    Blood transfusion without reported diagnosis 05/2015   COLONIC POLYPS, HX OF 06/24/2007   Coronary artery disease, non-occlusive 05/2015   Trop + w/ Acute Anemia =>CATH: small RI - Ostial 60%, ostial RCA 30% and dLAD 40-50%;; 9/'21: Cor Ca Score 624. Mild (25-49%) prox RCA & LAD,; Moderate (50-69%) Ostial Small RI & prox LCx.     DEPRESSION 10/08/2006   DIABETES MELLITUS, TYPE II 10/05/2006   GERD 06/24/2007   History of CVA (cerebrovascular accident) 08/10/2020   08/2020- found to be in afib with RVR, started on Eliquis (shower of emboli from A. fib as complication of severe AS)   HYPERLIPIDEMIA 10/08/2006   HYPERTENSION 10/08/2006   INSOMNIA 09/24/2008   Left knee DJD    Nonsustained paroxysmal ventricular tachycardia (HCC) 10/2018   Post-TAVR Zio Patch: Frequent (206) runs of NSVT - Fastest 5 beats 239 bpm. Longest 9.3 Sec avg 135 bpm. (? possibly Afib w/ aberrancy)   Osteoporosis 03/10/2016   PAF (paroxysmal atrial fibrillation) (HCC)    PAF with RVR: CHA2DS2-VASc 9.  On Eliquis and amiodarone. 08/11/2020   PEPTIC ULCER DISEASE 10/08/2006   S/P TAVR (transcatheter aortic valve replacement) 10/26/2020   s/p TAVR with a 26 mm Medtronic Evolut Pro+ via the TF approach  by Dr. Clifton James & Dr. Laneta Simmers   Severe aortic stenosis 08/2020   Progression from Mod-Severe AS to Severe AS - noted on Echo 08/2020 -> (mean gradient progressed from 34 to 42 mmHg) this was in setting of new onset A. fib RVR, acute diastolic HF and shower of emboli CVA. ->  Referred for TAVR, completed 10/26/2020   Past Surgical History:  Procedure Laterality Date   APPENDECTOMY     BIOPSY  10/21/2020   Procedure: BIOPSY;  Surgeon: Imogene Burn, MD;  Location: [facility] ENDOSCOPY;  Service: Gastroenterology;;   BREAST BIOPSY     CARDIAC CATHETERIZATION N/A 05/21/2015   Procedure: Left Heart Cath and Coronary Angiography;  Surgeon: [name], MD;  Location: [facility] INVASIVE CV LAB;  Service: Cardiovascular;: Ost RI 60%, Ost RCA 30%, dLAD tapers to small vessel w/ 40-50%. Mildly elevated LVEDP. Normal LV Fxn.   COLONOSCOPY N/A 05/20/2015   Procedure: COLONOSCOPY;  Surgeon: Ruffin Frederick, MD;  Location: [facility] ENDOSCOPY;  Service: Gastroenterology;  Laterality: N/A;   CORONARY CA2+ SCORE / CARDIAC CT ANGIOGRAM  10/09/2019   Calcium score 624.  82nd percentile. Dominant RCA: Mild (25-49%) proximal stenosis-distal bifurcation into PDA and PAV--< RPL branches.  LAD (1 major mid vessel diagonal) diffuse calcified plaque, mild proximal stenosis with minimal distal stenosis.  Small RI moderate ostial disease.  LCx-moderate mixed (50-69%) proximal stenosis.  Small dOM1 disease.  Trileaflet AoV, annular Ca2+ - probable AS   ESOPHAGOGASTRODUODENOSCOPY N/A 05/20/2015   Procedure: ESOPHAGOGASTRODUODENOSCOPY (EGD);  Surgeon: Ruffin Frederick, MD;  Location: [facility] ENDOSCOPY;  Service: Gastroenterology;  Laterality: N/A;   ESOPHAGOGASTRODUODENOSCOPY (EGD) WITH PROPOFOL N/A 10/21/2020   Procedure: ESOPHAGOGASTRODUODENOSCOPY (EGD) WITH PROPOFOL;  Surgeon: Imogene Burn, MD;  Location: [facility] ENDOSCOPY;  Service: Gastroenterology;  Laterality: N/A;   INTRAOPERATIVE TRANSTHORACIC ECHOCARDIOGRAM N/A 10/26/2020    Procedure: INTRAOPERATIVE TRANSTHORACIC ECHOCARDIOGRAM;  Surgeon: Clifton James; Location: [facility] OR; Pre-TAVR well-visualized calcified AoV mean Grad 37 mmHg, AVA 0.72 cm => Post TAVR well-positioned supra-annular 26 mm Medtronic Evolut Pro valve placed with no PVL.  Mean gradient 14 mmHg.  AVR 1.58 cm.  Normal flow to RCA and LM-LAD.  ; EF 60 to 65%.  Degenerative severe MAC w/ mild MR.   RIGHT/LEFT HEART CATH AND CORONARY ANGIOGRAPHY N/A 09/29/2020   Procedure: RIGHT/LEFT HEART CATH AND CORONARY ANGIOGRAPHY;  Surgeon: Kathleene Hazel, MD;  Location: [facility] INVASIVE CV LAB;  Service: Cardiovascular; pre-TAVR:  pRCA 20%, m-dRCA 30%. D LM-pLAD 20%. RI 30%, mLAD 20%.  Severe AS with mean gradient measured at 52.8 mmHg.   TONSILLECTOMY     TRANSCATHETER AORTIC VALVE REPLACEMENT, TRANSFEMORAL N/A 10/26/2020   Procedure: TRANSCATHETER AORTIC VALVE REPLACEMENT, TRANSFEMORAL;  Surgeon: Kathleene Hazel, MD;  Location: [facility] OR;  Service: Open Heart Surgery;; Medtronic Evolut-Pro + (size 26 mm, model # EVPROPLUS -26US, serial # [id number]); transient high-grade AV block noted so PPM left in place.   TRANSTHORACIC ECHOCARDIOGRAM  03/17/2019   a) 03/2019: EF 60 to 65%.  Moderate LVH.  GRII DD.  Mod-Severe AS (m grad 36 mmHg, peak 59 mmHg); b) 09/2019: EF 65-70%, No RWMA. Gr1 DD/hi LAP, Mild hi PAP. Mod LA Dil. MOD AS (mean Grad 34.5 mmHg).  STABLE   TRANSTHORACIC ECHOCARDIOGRAM  08/27/2020   Admitted for CVA/Afib RVR & CHF (Progression to SEVERE SYMPTOMATIC AS):  Severe Calcific Aortic Stenosis (VTI AVA estimated 0.86 cm, mean gradient 42 mmHg, V-max 4.36 m/s).  EF 55 to 60%.  Severe concentric LVH.  Unable to determine diastolic parameters.  Moderately elevated PAP.  Mild LA dilation.  Mild circumferential pericardial effusion.  Trivial MR.  Severe MAC.   TRANSTHORACIC ECHOCARDIOGRAM  11/25/2020   1 Month s/p TAVR: EF 55 to 60%.  No R WMA.  GR 1 DD.  Elevated LVEDP.  Normal RV size with mildly elevated RVP estimated  44 mmHg.  Oscillating density in the RV suspect calcified chordal apparatus versus calcified thrombus.  Moderate LA dilation.  Normal IVC/RA P => Well-positioned 26 mm Medtronic Evolut Pro THVwith mean AOV gradient 11 mmHg.  Trivial PVL   TRANSTHORACIC ECHOCARDIOGRAM  11/02/2020   a) Day 1 Post-op TAVR 10/27/20:  Well-Positioned Supra Annular Medtronic Evolut Pro THP.  Mean gradient 12 mmHg, peak 24 mmHg.  AVA 1.8 cm.  Trivial PVL.  EF 60 to 65%.  Normal LV function.  Mild LVH.  Severe LA dilation.  Mild RA dilation.  Severe MAC;; b) 11/02/20: Normal structure and function of the aortic valve prosthesis.  Mean gradient 9 mmHg (otherwise stable)   TUBAL LIGATION      Allergies  Allergies  Allergen Reactions   Ibuprofen Other (See Comments)    Bleeding events     Labs/Other Studies Reviewed    The following studies were reviewed today:  Cardiac Studies & Procedures   CARDIAC CATHETERIZATION  CARDIAC CATHETERIZATION 09/29/2020  Narrative   Prox RCA lesion is 20% stenosed.   Mid RCA to Dist RCA lesion is 20% stenosed.   Ramus lesion is 30% stenosed.   Mid LAD lesion is 20% stenosed.  Dist LM to Prox LAD lesion is 20% stenosed.  Mild non-obstructive CAD Severe aortic stenosis (mean gradient 52.8 mmHg, peak to peak gradient 58 mmHg, AVA 0.90 cm2).  Recommendations: Will continue workup for TAVR  Findings Coronary Findings Diagnostic  Dominance: Right  Left Main Dist LM to Prox LAD lesion is 20% stenosed.  Left Anterior Descending Vessel is large. Mid LAD lesion is 20% stenosed.  Ramus Intermedius Ramus lesion is 30% stenosed.  Right Coronary Artery Vessel is large. Prox RCA lesion is 20% stenosed. Mid RCA to Dist RCA lesion is 20% stenosed.  Intervention  No interventions have been documented.   CARDIAC CATHETERIZATION  CARDIAC CATHETERIZATION 05/21/2015  Narrative Images from the original result were not included. 1. Ost Ramus lesion, 60% stenosed. 2. Ost  RCA lesion, 30% stenosed. 3. Dist LAD lesion, tapers to a small vessel with diffuse roughly 40-50% stenosis. 4. The left ventricular systolic function is normal. 5. Elevated LVEDP  No culprit lesion noted to explain the patient's elevated troponin. The most significant lesion in the ostial ramus intermedius. This is not PCI target  She has elevated LVEDP, but relatively preserved EF.  Plan:  Return to TCU for monitoring and continued care.  TR band removal per protocol.  Defer further cardiac evaluation and management to Dr. Rennis Golden.    [name], M.D., M.S. Interventional Cardiologist  Pager # [phone] Phone # [phone] [address] [place], [place] [address]  Findings Coronary Findings Diagnostic  Dominance: Right  Left Main . Vessel is large.  Left Anterior Descending . Vessel is large. Tapers to relatively small caliber vessel distally with diffuse apical disease Diffuse. Vessel is a relatively small at this level with diffuse disease  First Diagonal Branch The vessel is moderate in size and is angiographically normal.  Lateral First Diagonal Branch The vessel is small in size.  First Septal Branch The vessel is small in size.  Second Septal Branch The vessel is small in size.  Third Septal Branch The vessel is small in size.  Ramus Intermedius . Vessel is moderate in size. Discrete located at the major branch.  Lateral Ramus Intermedius The vessel is small in size.  Left Circumflex . Vessel is angiographically normal.  First Obtuse Marginal Branch The vessel is angiographically normal.  Lateral First Obtuse Marginal Branch The vessel is small in size.  Right Coronary Artery . Vessel is large. Discrete.  Acute Marginal Branch The vessel is moderate in size.  First Right Posterolateral Branch The vessel is small in size.  Third Right Posterolateral Branch The vessel is small in size.  Intervention  No  interventions have been documented.    ECHOCARDIOGRAM  ECHOCARDIOGRAM COMPLETE 11/13/2022  Narrative ECHOCARDIOGRAM REPORT    Patient Name:   ALAYASIA BREEDING Date of Exam: 11/13/2022 Medical Rec #:  [id number]      Height:       62.0 in Accession #:    [id number]     Weight:       200.0 lb Date of Birth:  [birth year]      BSA:          1.912 m Patient Age:    82 years       BP:           128/72 mmHg Patient Gender: F              HR:           45 bpm. Exam Location:  [facility]  Procedure: 2D Echo, Cardiac Doppler and Color Doppler  Indications:    Z95.2 Post TAVR evaluation  History:        Patient has prior history of Echocardiogram examinations, most recent 11/09/2021. CAD, Stroke, Arrythmias:Atrial Fibrillation; Risk Factors:Hypertension and Dyslipidemia. Aortic stenosis. Morbid obesity. Aortic Valve: 26 mm Medtronic stented (TAVR) valve is present in the aortic position. Procedure Date: 10/26/2020.  Sonographer:    Cathie Beams RCS Referring Phys: [phone] DAVID [facility]   Sonographer Comments: Difficulty positioning patient on left side. IMPRESSIONS   1. Left ventricular ejection fraction, by estimation, is 60 to 65%. The left ventricle has normal function. The left ventricle has no regional wall motion abnormalities. There is mild concentric left ventricular hypertrophy. Left ventricular diastolic parameters are consistent with Grade II diastolic dysfunction (pseudonormalization). 2. Right ventricular systolic function was not well visualized. The right ventricular size is not well visualized. There is moderately elevated pulmonary artery systolic pressure. 3. Left atrial size was severely dilated. 4. The mitral valve is grossly normal. Trivial mitral valve regurgitation. No evidence of mitral stenosis. Severe mitral annular calcification. 5. The aortic valve has been repaired/replaced. Aortic valve regurgitation is trivial. There is a 26 mm Medtronic stented (TAVR) valve  present in the aortic position. Procedure Date: 10/26/2020. Echo findings are consistent with perivalvular leak of the aortic prosthesis. Aortic valve mean gradient measures 9.0 mmHg. 6. The inferior vena cava is normal in size with greater than 50% respiratory variability, suggesting right atrial pressure of 3 mmHg.  Comparison(s): No significant change from prior study.  FINDINGS Left Ventricle: Left ventricular ejection fraction, by estimation, is 60 to 65%. The left ventricle has normal function. The left ventricle has no regional wall motion abnormalities. The left ventricular internal cavity size was normal in size. There is mild concentric left ventricular hypertrophy. Left ventricular diastolic parameters are consistent with Grade II diastolic dysfunction (pseudonormalization).  Right Ventricle: The right ventricular size is not well visualized. Right vetricular wall thickness was not well visualized. Right ventricular systolic function was not well visualized. There is moderately elevated pulmonary artery systolic pressure. The tricuspid regurgitant velocity is 3.49 m/s, and with an assumed right atrial pressure of 3 mmHg, the estimated right ventricular systolic pressure is 51.7 mmHg.  Left Atrium: Left atrial size was severely dilated.  Right Atrium: Right atrial size was not well visualized.  Pericardium: There is no evidence of pericardial effusion.  Mitral Valve: The mitral valve is grossly normal. Severe mitral annular calcification. Trivial mitral valve regurgitation. No evidence of mitral valve stenosis.  Tricuspid Valve: The tricuspid valve is normal in structure. Tricuspid valve regurgitation is mild . No evidence of tricuspid stenosis.  Aortic Valve: The aortic valve has been repaired/replaced. Aortic valve regurgitation is trivial. Aortic valve mean gradient measures 9.0 mmHg. Aortic valve peak gradient measures 20.0 mmHg. There is a 26 mm Medtronic stented (TAVR) valve  present in the aortic position. Procedure Date: 10/26/2020.  Pulmonic Valve: The pulmonic valve was grossly normal. Pulmonic valve regurgitation is not visualized. No evidence of pulmonic stenosis.  Aorta: The aortic root, ascending aorta, aortic arch and descending aorta are all structurally normal, with no evidence of dilitation or obstruction.  Venous: The inferior vena cava is normal in size with greater than 50% respiratory variability, suggesting right atrial pressure of 3 mmHg.  IAS/Shunts: The interatrial septum was not well visualized.   LEFT VENTRICLE PLAX 2D LVIDd:         5.10 cm Diastology LVIDs:  2.30 cm LV e' medial:    4.62 cm/s LV PW:         1.20 cm LV E/e' medial:  31.8 LV IVS:        1.20 cm LV e' lateral:   9.68 cm/s LV E/e' lateral: 15.2   RIGHT VENTRICLE RV S prime:     11.60 cm/s  LEFT ATRIUM              Index LA diam:        5.80 cm  3.03 cm/m LA Vol (A2C):   110.0 ml 57.54 ml/m LA Vol (A4C):   84.0 ml  43.94 ml/m LA Biplane Vol: 97.4 ml  50.95 ml/m AORTIC VALVE AV Vmax:           223.50 cm/s AV Vmean:          138.000 cm/s AV VTI:            0.544 m AV Peak Grad:      20.0 mmHg AV Mean Grad:      9.0 mmHg LVOT Vmax:         111.00 cm/s LVOT Vmean:        68.200 cm/s LVOT VTI:          0.282 m LVOT/AV VTI ratio: 0.52  AORTA Ao Asc diam: 3.30 cm  MITRAL VALVE                TRICUSPID VALVE MV Area (PHT): 3.30 cm     TR Peak grad:   48.7 mmHg MV Decel Time: 230 msec     TR Vmax:        349.00 cm/s MV E velocity: 147.00 cm/s MV A velocity: 92.00 cm/s   SHUNTS MV E/A ratio:  1.60         Systemic VTI: 0.28 m  Jodelle Red MD Electronically signed by Jodelle Red MD Signature Date/Time: 11/13/2022/3:33:00 PM    Final   MONITORS  LONG TERM MONITOR-LIVE TELEMETRY (3-14 DAYS) 11/19/2020  Narrative  Predominantly sinus rhythm with minimum heart rate of 49 bpm, maximum 114 bpm. Average 68 bpm.  Total of 206  (Nonsustained Ventricular Tachycardia-V. tach fastest 5 beats continue 39 bpm. Longest 9.3 seconds average rate 1 3 5  bpm.  A. fib burden (16%) rate range 62-139 bpm with average rate 105 bpm. Longest was for 17 hr 55 min. Some aberrant conduction suspected.  Rare isolated PACs and PVCs. Some PVC couplets and triplets were noted.  No high-grade AV block noted.  Bursts of V. tach could be A. fib with aberrancy.   Patch Wear Time:  12 days and 19 hours (2022-09-22T09:52:09-398 to 2022-10-05T05:32:17-0400)  Abnormal monitor.  Still shows a significant mount of atrial fibrillation and short burst of nonsustained V. tach.  Thankfully, no high-grade AV block.  I think we can continue amiodarone and probably titrate up beta-blocker.   Bryan Lemma, MD  CT SCANS  CT CORONARY MORPH W/CTA COR W/SCORE 09/28/2020  Addendum 09/29/2020  1:17 PM ADDENDUM REPORT: 09/29/2020 13:14  CLINICAL DATA:  Pre-op transcatheter aortic valve replacement (TAVR)  EXAM: Cardiac TAVR CT  TECHNIQUE: The patient was scanned on a Siemens Force 192 slice scanner. A 120 kV retrospective scan was triggered in the descending thoracic aorta at 111 HU's. Gantry rotation speed was 270 msecs and collimation was .9 mm. The 3D data set was reconstructed in 5% intervals of the R-R cycle. Systolic and diastolic phases were analyzed on a dedicated work station using  MPR, MIP and VRT modes. The patient received 95mL OMNIPAQUE IOHEXOL 350 MG/ML SOLN of contrast.  FINDINGS: Aortic Valve: Tricuspid aortic valve. Severely reduced cusp separation. Severely thickened, moderately calcified aortic valve cusps.  AV calcium score: 1049  Virtual Basal Annulus Measurements:  Maximum/Minimum Diameter: 23.1 mm x 18.9 mm  Perimeter: 65.4 mm  Area: 328 mm2  No significant LVOT calcifications.  Based on these measurements, the annulus would be suitable for a 20 mm Edwards Sapien 3 valve vs 23 mm Medtronic Evolut Pro valve.  Sinus dimensions are borderline for 26 mm Medtronic Evolut Pro valve. Recommend Heart Team discussion for valve sizing.  Sinus of Valsalva Measurements:  Non-coronary:  26 mm  Right - coronary:  26 mm  Left - coronary:  27 mm  Sinus of Valsalva Height:  Left: 17.5 mm  Right: 18.6 mm  Aorta: Mild aortic atherosclerosis.  Sinotubular Junction: 27 mm  Ascending Thoracic Aorta:  31 mm  Aortic Arch:  24 mm  Descending Thoracic Aorta:  22 mm  Coronary Artery Height above Annulus:  Left Main: 13 mm  Right Coronary: 11.6 mm  Coronary Arteries: Normal origins. 3 vessel coronary artery calcifications.  Optimum Fluoroscopic Angle for Delivery: RAO 1, CRA 1  Moderate mitral annular calcification.  No left atrial appendage thrombus.  IMPRESSION: 1. Tricuspid aortic valve. Severely reduced cusp separation. Severely thickened, moderately calcified aortic valve cusps.  2.  AV calcium score: 1049  3. Annulus area: 328 mm2, no significant LVOT calcifications. Based on these measurements, the annulus would be suitable for a 20 mm Edwards Sapien 3 valve vs 23 mm Medtronic Evolut Pro valve. Sinus dimensions are borderline for 26 mm Medtronic Evolut Pro valve. Recommend Heart Team discussion for valve sizing.  4.  Sufficient coronary artery height from annulus.  5.  Optimum Fluoroscopic Angle for Delivery: RAO 1, CRA 1   Electronically Signed By: Weston Brass M.D. On: 09/29/2020 13:14  Narrative EXAM: OVER-READ INTERPRETATION  CT CHEST  The following report is an over-read performed by radiologist Dr. Trudie Reed of [facility] on 09/29/2020. This over-read does not include interpretation of cardiac or coronary anatomy or pathology. The coronary calcium score/coronary CTA interpretation by the cardiologist is attached.  COMPARISON:  Cardiac CTA 10/09/2019.  FINDINGS: Extracardiac findings will be described separately under dictation for  contemporaneously obtained CTA chest, abdomen and pelvis.  IMPRESSION: Please see separate dictation for contemporaneously obtained CTA chest, abdomen and pelvis dated 09/28/2020 for full description of relevant extracardiac findings.  Electronically Signed: By: Trudie Reed M.D. On: 09/29/2020 06:07   CT SCANS  CT CORONARY FRACTIONAL FLOW RESERVE DATA PREP 10/09/2019  Narrative EXAM: CT FFR ANALYSIS  CLINICAL DATA:  Chest pain/anginal equiv, 72yr CHD risk 10-20%, not treadmill candidate  FINDINGS: FFRct analysis was performed on the original cardiac CT angiogram dataset. Diagrammatic representation of the FFRct analysis is provided in a separate PDF document in PACS. This dictation was created using the PDF document and an interactive 3D model of the results. 3D model is not available in the EMR/PACS. Normal FFR range is >0.80.  1. Left Main:  No significant stenosis. FFR = 0.99  2. LAD: No significant stenosis. Proximal FFR = 0.97, Mid FFR = 0.90, Distal FFR = 0.85 3. LCX: No significant stenosis. Proximal FFR = 0.98, Distal FFR = 0.91 4. RCA: No significant stenosis. Proximal FFR = 0.96, Mid FFR = 0.92, Distal FFR = 0.91  IMPRESSION: 1.  CT FFR analysis did not  show any significant stenosis.   Electronically Signed By: Chrystie Nose M.D. On: 10/10/2019 23:18   CT CORONARY MORPH W/CTA COR W/SCORE 10/09/2019  Addendum 10/09/2019  9:12 PM ADDENDUM REPORT: 10/09/2019 21:10  HISTORY: 83 yo female with chest pain/anginal equiv, 9yr CHD risk 10-20%, not treadmill candidate  EXAM: Cardiac/Coronary CTA  TECHNIQUE: The patient was scanned on a [employer].  PROTOCOL: A 120 kV prospective scan was triggered in the descending thoracic aorta at 111 HU's. Axial non-contrast 3 mm slices were carried out through the heart. The data set was analyzed on a dedicated work station and scored using the Agatson method. Gantry rotation speed was 250 msecs  and collimation was .6 mm. Beta blockade and 0.8 mg of sl NTG was given. The 3D data set was reconstructed in 5% intervals of the 67-82 % of the R-R cycle. Diastolic phases were analyzed on a dedicated work station using MPR, MIP and VRT modes. The patient received 80mL OMNIPAQUE IOHEXOL 350 MG/ML SOLN of contrast.  FINDINGS: Quality: Fair, HR 59  Coronary calcium score: The patient's coronary artery calcium score is 624, which places the patient in the 82nd percentile.  Coronary arteries: Normal coronary origins.  Right dominance.  Right Coronary Artery: Dominant. Gives off large R-PDA and R-PLB branches. Mild 25-49% proximal mixed stenosis (CADRADS 2). No significant distal or branch disease.  Left Main Coronary Artery: Normal left main. Bifurcates into the LAD and LCx branches.  Left Anterior Descending Coronary Artery: Moderate sized anterior artery which gives off a mid-vessel diagonal branch. There is diffuse calcified plaque with mild 25-49% proximal stenosis (CADRADS3) and minimal mixed mid to distal vessel stenosis (<25% - CADRADS1).  Ramus intermedius: Small branch <2.0 mm, mild to moderate ostial disease  Left Circumflex Artery: AV groove vessel. There is a moderate mixed 50-69% proximal stenosis (CADRADS3). Small distal OM branch without disease.  Aorta: Normal size, 30 mm at the mid ascending aorta (level of the PA bifurcation) measured double oblique. Aortic atherosclerosis. No dissection.  Aortic Valve: Trileaflet with leaflet and annular calcification. Probable aortic stenosis.  Other findings:  Normal pulmonary vein drainage into the left atrium.  Normal left atrial appendage without a thrombus.  Dilated main pulmonary artery to 28 mm, suggestive of pulmonary hypertension.  Mitral annular calcification  IMPRESSION: 1. Diffuse multivessel mixed CAD, possibly significant in the proximal LCx, CADRADS = 3. CT FFR will be performed and  reported separately.  2. Coronary calcium score of 624. This was 82nd percentile for age and sex matched control.  3. Normal coronary origin with right dominance.  4. Dilated main pulmonary artery to 28 mm, suggestive of pulmonary hypertension.  5. Mitral annular calcification  6. Aortic annular and leaflet calcification, probable aortic stenosis   Electronically Signed By: Chrystie Nose M.D. On: 10/09/2019 21:10  Narrative EXAM: OVER-READ INTERPRETATION  CT CHEST  The following report is an over-read performed by radiologist Dr. Trudie Reed of [facility] on 10/09/2019. This over-read does not include interpretation of cardiac or coronary anatomy or pathology. The coronary calcium score/coronary CTA interpretation by the cardiologist is attached.  COMPARISON:  None.  FINDINGS: Aortic atherosclerosis. Within the visualized portions of the thorax there are no suspicious appearing pulmonary nodules or masses, there is no acute consolidative airspace disease, no pleural effusions, no pneumothorax and no lymphadenopathy. Visualized portions of the upper abdomen are unremarkable. There are no aggressive appearing lytic or blastic lesions noted in the visualized portions of the skeleton.  IMPRESSION: 1.  Aortic Atherosclerosis (ICD10-I70.0).  Electronically Signed: By: Trudie Reed M.D. On: 10/09/2019 15:23         Recent Labs: 09/25/2022: TSH 5.01 02/28/2023: ALT 12; B Natriuretic Peptide 594.1 03/01/2023: Hemoglobin 9.2; Platelets 238 03/03/2023: BUN 21; Creatinine, Ser 2.00; Magnesium 2.2; Potassium 4.4; Sodium 131  Recent Lipid Panel    Component Value Date/Time   CHOL 83 09/25/2022 1345   TRIG 65.0 09/25/2022 1345   TRIG 90 12/01/2005 1330   HDL 30.00 (L) 09/25/2022 1345   CHOLHDL 3 09/25/2022 1345   VLDL 13.0 09/25/2022 1345   LDLCALC 40 09/25/2022 1345    History of Present Illness    83 year old female with the above past medical  history including severe aortic stenosis s/p TAVR, transient post TAVR complete heart block, nonobstructive CAD, paroxysmal atrial fibrillation, chronic diastolic heart failure, CVA, recurrent GI bleed, hypertension, hyperlipidemia, type 2 diabetes, CKD stage III, hypothyroidism, GERD, and anxiety .  She has a history of nonobstructive CAD on CT and by cath 2022.  Additionally, she has a history of severe aortic stenosis s/p TAVR in 10/2020.  He had transient complete heart block post TAVR, not seen on follow-up Zio patch.  She has been stable on beta-blocker therapy.  She has a history of paroxysmal atrial fibrillation.  She has been stable on amiodarone.  She is no longer on anticoagulation in the setting of recurrent GI bleed.  Echocardiogram in 11/2022 showed EF 60 to 65%, normal LV function, no RWMA, mild concentric LVH, G2 DD, normal RV systolic function, severe MAC, stable TAVR, mean gradient 9 mmHg.  She was last in the office on 07/14/2022 and was stable from a cardiac standpoint.  She denied symptoms concerning for angina.  She presented to the ED on 02/28/2023 in the setting of progressive dyspnea, lower extremity edema.  She was hospitalized on 02/28/2023 to 03/03/2023 in the setting of acute on chronic diastolic heart failure.  She was diuresed with IV Lasix with significant improvement in her symptoms.  She required supplemental oxygen to maintain O2 sats during ambulation and was discharged home with supplemental home O2.  She presents today for follow-up.  Since her last visit and since her recent hospitalization  Chronic diastolic heart failure: Severe aortic stenosis: Transient CHB: Nonobstructive CAD: Paroxysmal atrial fibrillation: History of CVA: Hypertension: Hyperlipidemia: Type 2 diabetes: CKD stage III: Hypothyroidism: Disposition:  Home Medications    Current Outpatient Medications  Medication Sig Dispense Refill   acetaminophen (TYLENOL) 500 MG tablet Take 500 mg by mouth  every 6 (six) hours as needed for moderate pain.     ALPRAZolam (XANAX) 0.25 MG tablet TAKE 1 TABLET BY MOUTH THREE TIMES A DAY AS NEEDED FOR ANXIETY 60 tablet 2   amiodarone (PACERONE) 200 MG tablet TAKE 1 TABLET BY MOUTH EVERY DAY 90 tablet 1   atorvastatin (LIPITOR) 40 MG tablet TAKE 1 TABLET BY MOUTH EVERY DAY 90 tablet 3   blood glucose meter kit and supplies Dispense based on patient and insurance preference. Use up to four times daily as directed. E11.9 1 each 0   Blood Glucose Monitoring Suppl (ONE TOUCH ULTRA 2) w/Device KIT Use as directed daily 1 each 0   citalopram (CELEXA) 20 MG tablet Take 1 tablet (20 mg total) by mouth daily. (Patient taking differently: Take 10 mg by mouth daily.) 90 tablet 3   [employer] VITAMIN B12 1000 MCG tablet TAKE 1 TABLET BY MOUTH EVERY DAY 90 tablet 3   furosemide (  LASIX) 20 MG tablet Take 3 tablets (60 mg total) by mouth daily. 90 tablet 0   hydrALAZINE (APRESOLINE) 25 MG tablet Take 1 tablet (25 mg total) by mouth every 8 (eight) hours. 90 tablet 0   isosorbide mononitrate (IMDUR) 30 MG 24 hr tablet Take 0.5 tablets (15 mg total) by mouth daily. 30 tablet 0   Lancets (ONETOUCH DELICA PLUS LANCET33G) MISC USE AS DIRECTED TEST 1-2 TIMES A DAY 100 each 23   levothyroxine (SYNTHROID) 25 MCG tablet TAKE 1 TABLET BY MOUTH EVERY DAY 90 tablet 2   metFORMIN (GLUCOPHAGE) 1000 MG tablet TAKE 1 TABLET BY MOUTH IN THE AM AND 1/2 IN THE PM 135 tablet 1   metoprolol succinate (TOPROL-XL) 25 MG 24 hr tablet TAKE 1/2 TABLET BY MOUTH EVERY DAY 45 tablet 3   mirtazapine (REMERON) 15 MG tablet TAKE 1 TABLET BY MOUTH EVERYDAY AT BEDTIME 90 tablet 3   ONETOUCH ULTRA test strip USE AS INSTRUCTED TEST 1-2 TIMES A DAY 100 strip 23   pantoprazole (PROTONIX) 40 MG tablet TAKE 1 TABLET BY MOUTH TWICE A DAY 180 tablet 1   solifenacin (VESICARE) 5 MG tablet Take 1 tablet (5 mg total) by mouth daily. 90 tablet 3   No current facility-administered medications for this visit.     Review  of Systems    ***.  All other systems reviewed and are otherwise negative except as noted above.    Physical Exam    VS:  There were no vitals taken for this visit. , BMI There is no height or weight on file to calculate BMI.     GEN: Well nourished, well developed, in no acute distress. HEENT: normal. Neck: Supple, no JVD, carotid bruits, or masses. Cardiac: RRR, no murmurs, rubs, or gallops. No clubbing, cyanosis, edema.  Radials/DP/PT 2+ and equal bilaterally.  Respiratory:  Respirations regular and unlabored, clear to auscultation bilaterally. GI: Soft, nontender, nondistended, BS + x 4. MS: no deformity or atrophy. Skin: warm and dry, no rash. Neuro:  Strength and sensation are intact. Psych: Normal affect.  Accessory Clinical Findings    ECG personally reviewed by me today -    - no acute changes.   Lab Results  Component Value Date   WBC 5.8 03/01/2023   HGB 9.2 (L) 03/01/2023   HCT 30.0 (L) 03/01/2023   MCV 96.8 03/01/2023   PLT 238 03/01/2023   Lab Results  Component Value Date   CREATININE 2.00 (H) 03/03/2023   BUN 21 03/03/2023   NA 131 (L) 03/03/2023   K 4.4 03/03/2023   CL 97 (L) 03/03/2023   CO2 28 03/03/2023   Lab Results  Component Value Date   ALT 12 02/28/2023   AST 22 02/28/2023   ALKPHOS 56 02/28/2023   BILITOT 1.6 (H) 02/28/2023   Lab Results  Component Value Date   CHOL 83 09/25/2022   HDL 30.00 (L) 09/25/2022   LDLCALC 40 09/25/2022   TRIG 65.0 09/25/2022   CHOLHDL 3 09/25/2022    Lab Results  Component Value Date   HGBA1C 6.0 (H) 03/01/2023    Assessment & Plan    1.  ***  No BP recorded.  {Refresh Note OR Click here to enter BP  :1}***   Joylene Grapes, NP 03/12/2023, 5:50 AM

## 2023-03-12 NOTE — Progress Notes (Unsigned)
Complex Care Management Note Care Guide Note  03/12/2023 Name: Mary Mccarthy MRN: [id number] DOB: 02-22-1940   Complex Care Management Outreach Attempts: A second unsuccessful outreach was attempted today to offer the patient with information about available complex care management services.  Follow Up Plan:  Additional outreach attempts will be made to offer the patient complex care management information and services.   Encounter Outcome:  No Answer  Burman Nieves, CMA, Care Guide [facility]  [facility], [facility] Guide Direct Dial: [phone]  Fax: [phone] Website: [email or web address]

## 2023-03-13 NOTE — Progress Notes (Signed)
 Complex Care Management Note  Care Guide Note 03/13/2023 Name: Keir Foland MRN: [id number] DOB: 04-Nov-1940  [name] is a 83 y.o. year old female who sees [name], MD for primary care. I reached out to [name] by phone today to offer complex care management services.  Ms. Suchocki was given information about Complex Care Management services today including:   The Complex Care Management services include support from the care team which includes your Nurse Care Manager, Clinical Social Worker, or Pharmacist.  The Complex Care Management team is here to help remove barriers to the health concerns and goals most important to you. Complex Care Management services are voluntary, and the patient may decline or stop services at any time by request to their care team member.   Complex Care Management Consent Status: Patient agreed to services and verbal consent obtained.   Follow up plan:  Telephone appointment with complex care management team member scheduled for:  03/16/2023  Encounter Outcome:  Patient Scheduled  Thedford Franks, CMA, Care Guide [facility]  [facility], [facility] Guide Direct Dial: [phone]  Fax: [phone] Website: [email or web address]

## 2023-03-14 ENCOUNTER — Ambulatory Visit (INDEPENDENT_AMBULATORY_CARE_PROVIDER_SITE_OTHER): Payer: 59

## 2023-03-14 ENCOUNTER — Other Ambulatory Visit: Payer: Self-pay | Admitting: *Deleted

## 2023-03-14 ENCOUNTER — Ambulatory Visit: Payer: 59 | Admitting: Internal Medicine

## 2023-03-14 ENCOUNTER — Encounter: Payer: Self-pay | Admitting: Internal Medicine

## 2023-03-14 VITALS — BP 132/70 | HR 80 | Temp 98.2°F | Ht 61.0 in | Wt 186.0 lb

## 2023-03-14 DIAGNOSIS — R0689 Other abnormalities of breathing: Secondary | ICD-10-CM

## 2023-03-14 DIAGNOSIS — I5033 Acute on chronic diastolic (congestive) heart failure: Secondary | ICD-10-CM | POA: Diagnosis not present

## 2023-03-14 DIAGNOSIS — J811 Chronic pulmonary edema: Secondary | ICD-10-CM | POA: Diagnosis not present

## 2023-03-14 DIAGNOSIS — I517 Cardiomegaly: Secondary | ICD-10-CM | POA: Diagnosis not present

## 2023-03-14 DIAGNOSIS — E559 Vitamin D deficiency, unspecified: Secondary | ICD-10-CM | POA: Diagnosis not present

## 2023-03-14 DIAGNOSIS — R0989 Other specified symptoms and signs involving the circulatory and respiratory systems: Secondary | ICD-10-CM | POA: Diagnosis not present

## 2023-03-14 DIAGNOSIS — I5032 Chronic diastolic (congestive) heart failure: Secondary | ICD-10-CM | POA: Diagnosis not present

## 2023-03-14 DIAGNOSIS — E538 Deficiency of other specified B group vitamins: Secondary | ICD-10-CM | POA: Diagnosis not present

## 2023-03-14 DIAGNOSIS — I509 Heart failure, unspecified: Secondary | ICD-10-CM | POA: Diagnosis not present

## 2023-03-14 DIAGNOSIS — E119 Type 2 diabetes mellitus without complications: Secondary | ICD-10-CM

## 2023-03-14 DIAGNOSIS — N1832 Chronic kidney disease, stage 3b: Secondary | ICD-10-CM | POA: Diagnosis not present

## 2023-03-14 LAB — BASIC METABOLIC PANEL
BUN: 20 mg/dL (ref 6–23)
CO2: 29 meq/L (ref 19–32)
Calcium: 9.2 mg/dL (ref 8.4–10.5)
Chloride: 100 meq/L (ref 96–112)
Creatinine, Ser: 1.52 mg/dL — ABNORMAL HIGH (ref 0.40–1.20)
GFR: 31.32 mL/min — ABNORMAL LOW (ref >60.00)
Glucose, Bld: 94 mg/dL (ref 70–99)
Potassium: 4.1 meq/L (ref 3.5–5.1)
Sodium: 138 meq/L (ref 135–145)

## 2023-03-14 LAB — HEPATIC FUNCTION PANEL
ALT: 14 U/L (ref 0–35)
AST: 27 U/L (ref 0–37)
Albumin: 3.5 g/dL (ref 3.5–5.2)
Alkaline Phosphatase: 58 U/L (ref 39–117)
Bilirubin, Direct: 0.2 mg/dL (ref 0.0–0.3)
Total Bilirubin: 0.7 mg/dL (ref 0.2–1.2)
Total Protein: 7.2 g/dL (ref 6.0–8.3)

## 2023-03-14 LAB — LIPID PANEL
Cholesterol: 102 mg/dL (ref 0–200)
HDL: 30.1 mg/dL — ABNORMAL LOW (ref >39.00)
LDL Cholesterol: 51 mg/dL (ref 0–99)
NonHDL: 71.73
Total CHOL/HDL Ratio: 3
Triglycerides: 102 mg/dL (ref 0.0–149.0)
VLDL: 20.4 mg/dL (ref 0.0–40.0)

## 2023-03-14 LAB — HEMOGLOBIN A1C: Hgb A1c MFr Bld: 6.5 % (ref 4.6–6.5)

## 2023-03-14 LAB — VITAMIN D 25 HYDROXY (VIT D DEFICIENCY, FRACTURES): VITD: 9.22 ng/mL — ABNORMAL LOW (ref 30.00–100.00)

## 2023-03-14 LAB — VITAMIN B12: Vitamin B-12: 1075 pg/mL — ABNORMAL HIGH (ref 211–911)

## 2023-03-14 LAB — BRAIN NATRIURETIC PEPTIDE: Pro B Natriuretic peptide (BNP): 235 pg/mL — ABNORMAL HIGH (ref 0.0–100.0)

## 2023-03-14 MED ORDER — ALPRAZOLAM 0.25 MG PO TABS: 0.2500 mg | ORAL_TABLET | Freq: Three times a day (TID) | ORAL | 2 refills | Status: DC | PRN

## 2023-03-14 MED ORDER — SOLIFENACIN SUCCINATE 5 MG PO TABS: 5.0000 mg | ORAL_TABLET | Freq: Every day | ORAL | 3 refills | Status: DC

## 2023-03-14 NOTE — Progress Notes (Signed)
 The test results show that your current treatment is OK, as the tests are stable.  Please continue the same plan.  There is no other need for change of treatment or further evaluation based on these results, at this time.  thanks

## 2023-03-14 NOTE — Patient Outreach (Signed)
 Care Management  Transitions of Care Program Transitions of Care Post-discharge week 2/ day # 9   03/14/2023 Name: Mary Mccarthy MRN: [id number] DOB: Aug 07, 1940  Subjective: Mary Mccarthy is a 83 y.o. year old female who is a primary care patient of [name], MD. The Care Management team Engaged with patient Engaged with patient by telephone to assess and address transitions of care needs.   Consent to Services:  Patient was given information about care management services, agreed to services, and gave verbal consent to participate.  Enrolled into TOC 30-day program: 03/05/23  Assessment: I am doing much better and getting stronger; Dr. Norleen gave me a good report today; and not having any problems.  Using the oxygen  is going fine.  My son went with me to the doctor's office today; he is still managing my medications, but I know what I am taking.  I will start doing better weighing myself every day.   Patient denies specific clinical concerns today and sounds to be in no distress throughout [facility] 30-day program outreach call today          SDOH Interventions    Flowsheet Row Telephone from 03/05/2023 in [facility] ED to Hosp-Admission (Discharged) from 02/28/2023 in [facility] 3E HF PCU Clinical Support from 05/11/2022 in [facility] at [facility] Chronic Care Management from 08/03/2021 in [facility] at [facility] Clinical Support from 05/09/2021 in [facility] at [facility] Chronic Care Management from 02/21/2021 in [facility] at [place]  SDOH Interventions        Food Insecurity Interventions Intervention Not Indicated  [Today, patient denies food insecurity: she confirms she received printed resources on AVS from hospital discharge on 03/03/23] Inpatient TOC, Community Resources Provided Intervention Not Indicated Intervention Not Indicated   [continues to deny food insecurity] Intervention Not Indicated Intervention Not Indicated  [denies food insecurity, confirms has food stamps]  Housing Interventions Other (Comment)  [Today, patient denies housing concerns-- however- she confirms she received printed resources on AVS from hospital discharge on 03/03/23] Inpatient TOC, Community Resources Provided Intervention Not Indicated -- Intervention Not Indicated --  Transportation Interventions Intervention Not Indicated  [son/ nephew provide transportation] -- Intervention Not Indicated Intervention Not Indicated  [family/ nephew continues providing transportation] Intervention Not Indicated Intervention Not Indicated  [continues driving self short distances,  family also assists as/ if indicated]  Utilities Interventions Other (Comment)  [Today, patient denies utility insecurity: she confirms she has heat/ water,  confirms she received printed resources on AVS from hospital discharge on 03/03/23] Inpatient TOC, Community Resources Provided Intervention Not Indicated -- -- --  Alcohol Usage Interventions -- -- Intervention Not Indicated (Score <7) -- -- --  Depression Interventions/Treatment  -- -- PHQ2-9 Score <4 Follow-up Not Indicated -- -- --  Financial Strain Interventions -- -- Intervention Not Indicated -- Intervention Not Indicated --  Physical Activity Interventions -- -- Patient Refused, Other (Comments) -- Patient Refused, Intervention Not Indicated --  Stress Interventions -- -- Intervention Not Indicated -- Intervention Not Indicated --  Social Connections Interventions -- Inpatient [facility] Intervention Not Indicated -- Intervention Not Indicated --        Goals Addressed             This Visit's Progress    TOC 30-day Program Care Plan   On track    Current Barriers:  Medication management declined full medication review on  initial TOC call: son manages most aspects of medications; is not present at home during [facility] call;  patient unable to retrieve post-discharge AVS instructions to review Provider appointments facilitated HFU PCP OV for 03/14/23; patient needs cardiology provider appointment for HFU: confirmed she has contact information for cardiologist: she agrees to call/ schedule promptly Home Health services patient declined home health services post-hospital discharge Equipment/DME new to home O2 use; confirmed regularly uses assistive devices (walker) for ambulation Fragile state of health/ advanced age; good family support; patient is essentially independent in some self-care activities; family assists as indicated  RNCM Clinical Goal(s):  Patient will work with the Care Management team over the next 30 days to address Transition of Care Barriers: Medication Management Provider appointments take all medications exactly as prescribed and will call provider for medication related questions as evidenced by full medication review with patient and/ or son-caregiver during [facility] 30-day program weekly outreach phone calls attend all scheduled medical appointments: 03/14/23- PCP for hospital follow up; encouraged patient to schedule promptly with cardiology provider as evidenced by review of same with patient/ review of EHR/ collaboration with care providers as indicated, during [facility] 30-day program weekly outreach phone calls not experience hospital admission as evidenced by review of EMR. Hospital Admissions in last 6 months = 1  through collaboration with RN Care manager, provider, and care team.   Interventions: Evaluation of current treatment plan related to  self management and patient's adherence to plan as established by provider  Transitions of Care:  Goal on track:  Yes. 03/14/23 Labs reviewed lab results from today's HFU OV with PCP Durable Medical Equipment (DME) needs assessed with patient/caregiver Doctor Visits  - discussed the importance of doctor visits Discussed current clinical condition:  she  reports, I am doing much better and getting stronger; Dr. Norleen gave me a good report today; and not having any problems.  Using the oxygen  is going fine.  My son went with me to the doctor's office today; he is still managing my medications, but I know what I am taking.  I will start doing better weighing myself every day. She denies specific clinical concerns today and sounds to be in no distress throughout [facility] 30-day program outreach call today Re-confirmed uses assistive devices on regular basis, at baseline -- walker Reinforced previously provided education around benefit of conservative post-hospital discharge activity; need to pace activity without over-doing  Confirms using home O2 as per hospital discharge instructions: 2-3 L/min; denies issues/ concerns with home O2 Confirmed patient is inconsistently obtaining daily weights at home: reviewed recent weights at home/ today's office visit with PCP- she reports consistent weights between 185-186 lbs; again encouraged patient to begin consistently monitoring/ recording daily weights at home Reinforced previously provided education / rationale for daily weight monitoring at home as earliest indicator of fluid retention, along with weight gain guidelines/ action plan for weight gain; importance of taking diuretic as prescribed Advised patient to take all recorded weights from home monitoring to upcoming cardiology provider office visit for provider review Reinforced previously provided education around (late) signs and symptoms (other than weight gain) that indicate fluid retention along with action plan for same Reviewed upcoming scheduled provider office visits:  03/28/23: PCP follow up visit; 04/06/23: Cardiology provider: confirmed patient is aware of all and has plans to attend as scheduled Full medication reconciliation/ review completed; no concerns or discrepancies identified; confirmed patient reports adherence to medications; son-manages  medications uses pillbox- then patient takes independently; patient denies  questions/ concerns around medications today  Reviewed most recent A1C value from 03/14/23: 6.5; reviewed historical trends over time with patient who verbalizes good understanding of same   Patient Goals/Self-Care Activities: Participate in Transition of Care Program/Attend TOC scheduled calls Take all medications as prescribed Attend all scheduled provider appointments Call provider office for new concerns or questions  Continue pacing activity to avoid episodes of shortness of breath Continue using home oxygen  as prescribed Weigh yourself every day to stay on top of early fluid retention: write down your weights every day so you remember what it is from day to day- and take these values from home to your doctor appointments with you Continue to follow your established action plan for episodes of shortness of breath Eat a heart healthy and low salt diet Use assistive devices (your walker) as needed to prevent falls  Follow Up Plan:  Telephone follow up appointment with care management team member scheduled for:  Wednesday, 03/21/23 at 11:00 am         Plan: Telephone follow up appointment with care management team member scheduled for:   Wednesday, 03/21/23 at 11:00 am  Total time spent from review to signing of note/ including any care coordination interventions: 75 minutes  Beatris Blinda Lawrence, RN, BSN, Media Planner  Transitions of Care  [facility] - [facility] [phone]: direct office

## 2023-03-14 NOTE — Patient Instructions (Signed)
 Visit Information  Thank you for taking time to visit with me today. Please don't hesitate to contact me if I can be of assistance to you before our next scheduled telephone appointment.  Our next appointment is by telephone on Wednesday 03/21/23 at 11:00 am  Please call the care guide team at [phone] if you need to cancel or reschedule your appointment.   Following are the goals we discussed today:  Patient Goals/Self-Care Activities: Participate in Transition of Care Program/Attend TOC scheduled calls Take all medications as prescribed Attend all scheduled provider appointments Call provider office for new concerns or questions  Continue pacing activity to avoid episodes of shortness of breath Continue using home oxygen  as prescribed Weigh yourself every day to stay on top of early fluid retention: write down your weights every day so you remember what it is from day to day- and take these values from home to your doctor appointments with you Continue to follow your established action plan for episodes of shortness of breath Eat a heart healthy and low salt diet Use assistive devices (your walker) as needed to prevent falls  If you are experiencing a Mental Health or Behavioral Health Crisis or need someone to talk to, please  call the Suicide and Crisis Lifeline: 988 call the [place]  National Suicide Prevention Lifeline: (250) 133-2492 or TTY: 908-004-3484 TTY 423-510-8878) to talk to a trained counselor call 1-800-273-TALK (toll free, 24 hour hotline) go to [facility] [address], [place] [phone]) call the [facility] Crisis Line: 9523751706 call 911   Patient verbalizes understanding of instructions and care plan provided today and agrees to view in MyChart. Active MyChart status and patient understanding of how to access instructions and care plan via MyChart confirmed with patient.     Canaan Holzer Mckinney Katlen Seyer, RN, BSN,  Media Planner  Transitions of Care  [facility] - [facility] [phone]: direct office

## 2023-03-14 NOTE — Progress Notes (Signed)
 Patient ID: Mary Mccarthy, female   DOB: 1940/06/09, 83 y.o.   MRN: [id number]        Chief Complaint: follow up recent hospn 1/22 - 1/25 with chf, CKD3a       HPI:  Mary Mccarthy is a 83 y.o. female here with above, Pt denies chest pain, increased sob or doe, wheezing, orthopnea, PND, increased LE swelling, palpitations, dizziness or syncope.   Pt denies fever, night sweats, loss of appetite, or other constitutional symptoms   Pt denies polydipsia, polyuria, or new focal neuro s/s.   Wt  overall down 14 lbs.         Wt Readings from Last 3 Encounters:  03/14/23 186 lb (84.4 kg)  03/03/23 191 lb 12.8 oz (87 kg)  09/25/22 200 lb (90.7 kg)   BP Readings from Last 3 Encounters:  03/14/23 132/70  03/03/23 (!) 129/44  09/25/22 128/72         Past Medical History:  Diagnosis Date   ALLERGIC RHINITIS 06/24/2007   Anemia    AVM (arteriovenous malformation) of colon    Blood transfusion without reported diagnosis 05/2015   COLONIC POLYPS, HX OF 06/24/2007   Coronary artery disease, non-occlusive 05/2015   Trop + w/ Acute Anemia =>CATH: small RI - Ostial 60%, ostial RCA 30% and dLAD 40-50%;; 9/'21: Cor Ca Score 624. Mild (25-49%) prox RCA & LAD,; Moderate (50-69%) Ostial Small RI & prox LCx.     DEPRESSION 10/08/2006   DIABETES MELLITUS, TYPE II 10/05/2006   GERD 06/24/2007   History of CVA (cerebrovascular accident) 08/10/2020   08/2020- found to be in afib with RVR, started on Eliquis  (shower of emboli from A. fib as complication of severe AS)   HYPERLIPIDEMIA 10/08/2006   HYPERTENSION 10/08/2006   INSOMNIA 09/24/2008   Left knee DJD    Nonsustained paroxysmal ventricular tachycardia (HCC) 10/2018   Post-TAVR Zio Patch: Frequent (206) runs of NSVT - Fastest 5 beats 239 bpm. Longest 9.3 Sec avg 135 bpm. (? possibly Afib w/ aberrancy)   Osteoporosis 03/10/2016   PAF (paroxysmal atrial fibrillation) (HCC)    PAF with RVR: CHA2DS2-VASc 9.  On Eliquis  and amiodarone .  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 Verlin & Dr. Lucas   Severe aortic stenosis 08/2020   Progression from Mod-Severe AS to Severe AS - noted on Echo 08/2020 -> (mean gradient progressed from 34 to 42 mmHg) this was in setting of new onset A. fib RVR, acute diastolic HF and shower of emboli CVA. ->  Referred for TAVR, completed 10/26/2020   Past Surgical History:  Procedure Laterality Date   APPENDECTOMY     BIOPSY  10/21/2020   Procedure: BIOPSY;  Surgeon: Federico Rosario BROCKS, MD;  Location: [facility] ENDOSCOPY;  Service: Gastroenterology;;   BREAST BIOPSY     CARDIAC CATHETERIZATION N/A 05/21/2015   Procedure: Left Heart Cath and Coronary Angiography;  Surgeon: Alm LELON Clay, MD;  Location: [facility] INVASIVE CV LAB;  Service: Cardiovascular;: Ost RI 60%, Ost RCA 30%, dLAD tapers to small vessel w/ 40-50%. Mildly elevated LVEDP. Normal LV Fxn.   COLONOSCOPY N/A 05/20/2015   Procedure: COLONOSCOPY;  Surgeon: Elspeth Deward Naval, MD;  Location: [facility] ENDOSCOPY;  Service: Gastroenterology;  Laterality: N/A;   CORONARY CA2+ SCORE / CARDIAC CT ANGIOGRAM  10/09/2019   Calcium  score 624.  82nd  percentile. Dominant RCA: Mild (25-49%) proximal stenosis-distal bifurcation into PDA and PAV--< RPL branches.  LAD (1 major mid vessel diagonal) diffuse calcified plaque, mild proximal stenosis with minimal distal stenosis.  Small RI moderate ostial disease.  LCx-moderate mixed (50-69%) proximal stenosis.  Small dOM1 disease.  Trileaflet AoV, annular Ca2+ - probable AS   ESOPHAGOGASTRODUODENOSCOPY N/A 05/20/2015   Procedure: ESOPHAGOGASTRODUODENOSCOPY (EGD);  Surgeon: Elspeth Deward Naval, MD;  Location: [facility] ENDOSCOPY;  Service: Gastroenterology;  Laterality: N/A;   ESOPHAGOGASTRODUODENOSCOPY (EGD) WITH PROPOFOL  N/A 10/21/2020   Procedure: ESOPHAGOGASTRODUODENOSCOPY (EGD) WITH  PROPOFOL ;  Surgeon: Federico Rosario BROCKS, MD;  Location: [facility] ENDOSCOPY;  Service: Gastroenterology;  Laterality: N/A;   INTRAOPERATIVE TRANSTHORACIC ECHOCARDIOGRAM N/A 10/26/2020   Procedure: INTRAOPERATIVE TRANSTHORACIC ECHOCARDIOGRAM;  Surgeon: Verlin; Location: [facility] OR; Pre-TAVR well-visualized calcified AoV mean Grad 37 mmHg, AVA 0.72 cm => Post TAVR well-positioned supra-annular 26 mm Medtronic Evolut Pro valve placed with no PVL.  Mean gradient 14 mmHg.  AVR 1.58 cm.  Normal flow to RCA and LM-LAD.  ; EF 60 to 65%.  Degenerative severe MAC w/ mild MR.   RIGHT/LEFT HEART CATH AND CORONARY ANGIOGRAPHY N/A 09/29/2020   Procedure: RIGHT/LEFT HEART CATH AND CORONARY ANGIOGRAPHY;  Surgeon: Verlin Lonni BIRCH, MD;  Location: [facility] INVASIVE CV LAB;  Service: Cardiovascular; pre-TAVR:  pRCA 20%, m-dRCA 30%. D LM-pLAD 20%. RI 30%, mLAD 20%.  Severe AS with mean gradient measured at 52.8 mmHg.   TONSILLECTOMY     TRANSCATHETER AORTIC VALVE REPLACEMENT, TRANSFEMORAL N/A 10/26/2020   Procedure: TRANSCATHETER AORTIC VALVE REPLACEMENT, TRANSFEMORAL;  Surgeon: Verlin Lonni BIRCH, MD;  Location: [facility] OR;  Service: Open Heart Surgery;; Medtronic Evolut-Pro + (size 26 mm, model # EVPROPLUS -26US, serial # [id number]); transient high-grade AV block noted so PPM left in place.   TRANSTHORACIC ECHOCARDIOGRAM  03/17/2019   a) 03/2019: EF 60 to 65%.  Moderate LVH.  GRII DD.  Mod-Severe AS (m grad 36 mmHg, peak 59 mmHg); b) 09/2019: EF 65-70%, No RWMA. Gr1 DD/hi LAP, Mild hi PAP. Mod LA Dil. MOD AS (mean Grad 34.5 mmHg).  STABLE   TRANSTHORACIC ECHOCARDIOGRAM  08/27/2020   Admitted for CVA/Afib RVR & CHF (Progression to SEVERE SYMPTOMATIC AS):  Severe Calcific Aortic Stenosis (VTI AVA estimated 0.86 cm, mean gradient 42 mmHg, V-max 4.36 m/s).  EF 55 to 60%.  Severe concentric LVH.  Unable to determine diastolic parameters.  Moderately elevated PAP.  Mild LA dilation.  Mild circumferential pericardial effusion.  Trivial MR.   Severe MAC.   TRANSTHORACIC ECHOCARDIOGRAM  11/25/2020   1 Month s/p TAVR: EF 55 to 60%.  No R WMA.  GR 1 DD.  Elevated LVEDP.  Normal RV size with mildly elevated RVP estimated 44 mmHg.  Oscillating density in the RV suspect calcified chordal apparatus versus calcified thrombus.  Moderate LA dilation.  Normal IVC/RA P => Well-positioned 26 mm Medtronic Evolut Pro THVwith mean AOV gradient 11 mmHg.  Trivial PVL   TRANSTHORACIC ECHOCARDIOGRAM  11/02/2020   a) Day 1 Post-op TAVR 10/27/20:  Well-Positioned Supra Annular Medtronic Evolut Pro THP.  Mean gradient 12 mmHg, peak 24 mmHg.  AVA 1.8 cm.  Trivial PVL.  EF 60 to 65%.  Normal LV function.  Mild LVH.  Severe LA dilation.  Mild RA dilation.  Severe MAC;; b) 11/02/20: Normal structure and function of the aortic valve prosthesis.  Mean gradient 9 mmHg (otherwise stable)   TUBAL LIGATION      reports that she has never smoked. She has never used  smokeless tobacco. She reports that she does not drink alcohol and does not use drugs. family history includes Alcohol abuse in an other family member; Arrhythmia in her sister; Arthritis in an other family member; CAD in her sister; Cancer in her mother; Depression in an other family member; Diabetes in an other family member; Heart attack (age of onset: 38) in her sister; Heart disease in an other family member; Hyperlipidemia in an other family member; Hypertension in an other family member; Stroke in an other family member. Allergies  Allergen Reactions   Ibuprofen Other (See Comments)    Bleeding events   Current Outpatient Medications on File Prior to Visit  Medication Sig Dispense Refill   acetaminophen  (TYLENOL ) 500 MG tablet Take 500 mg by mouth every 6 (six) hours as needed for moderate pain.     amiodarone  (PACERONE ) 200 MG tablet TAKE 1 TABLET BY MOUTH EVERY DAY 90 tablet 1   atorvastatin  (LIPITOR ) 40 MG tablet TAKE 1 TABLET BY MOUTH EVERY DAY 90 tablet 3   blood glucose meter kit and supplies  Dispense based on patient and insurance preference. Use up to four times daily as directed. E11.9 1 each 0   Blood Glucose Monitoring Suppl (ONE TOUCH ULTRA 2) w/Device KIT Use as directed daily 1 each 0   citalopram  (CELEXA ) 20 MG tablet Take 1 tablet (20 mg total) by mouth daily. (Patient taking differently: Take 10 mg by mouth daily.) 90 tablet 3   [employer] VITAMIN B12 1000 MCG tablet TAKE 1 TABLET BY MOUTH EVERY DAY 90 tablet 3   furosemide  (LASIX ) 20 MG tablet Take 3 tablets (60 mg total) by mouth daily. 90 tablet 0   hydrALAZINE  (APRESOLINE ) 25 MG tablet Take 1 tablet (25 mg total) by mouth every 8 (eight) hours. 90 tablet 0   isosorbide  mononitrate (IMDUR ) 30 MG 24 hr tablet Take 0.5 tablets (15 mg total) by mouth daily. 30 tablet 0   Lancets (ONETOUCH DELICA PLUS LANCET33G) MISC USE AS DIRECTED TEST 1-2 TIMES A DAY 100 each 23   levothyroxine  (SYNTHROID ) 25 MCG tablet TAKE 1 TABLET BY MOUTH EVERY DAY 90 tablet 2   metFORMIN  (GLUCOPHAGE ) 1000 MG tablet TAKE 1 TABLET BY MOUTH IN THE AM AND 1/2 IN THE PM 135 tablet 1   metoprolol  succinate (TOPROL -XL) 25 MG 24 hr tablet TAKE 1/2 TABLET BY MOUTH EVERY DAY 45 tablet 3   mirtazapine  (REMERON ) 15 MG tablet TAKE 1 TABLET BY MOUTH EVERYDAY AT BEDTIME 90 tablet 3   ONETOUCH ULTRA test strip USE AS INSTRUCTED TEST 1-2 TIMES A DAY 100 strip 23   pantoprazole  (PROTONIX ) 40 MG tablet TAKE 1 TABLET BY MOUTH TWICE A DAY 180 tablet 1   No current facility-administered medications on file prior to visit.        ROS:  All others reviewed and negative.  Objective        PE:  BP 132/70 (BP Location: Right Arm, Patient Position: Sitting, Cuff Size: Normal)   Pulse 80   Temp 98.2 F (36.8 C) (Oral)   Ht 5' 1 (1.549 m)   Wt 186 lb (84.4 kg)   SpO2 98%   BMI 35.14 kg/m                 Constitutional: Pt appears in NAD               HENT: Head: NCAT.  Right Ear: External ear normal.                 Left Ear: External ear normal.                 Eyes: . Pupils are equal, round, and reactive to light. Conjunctivae and EOM are normal               Nose: without d/c or deformity               Neck: Neck supple. Gross normal ROM               Cardiovascular: Normal rate and regular rhythm.                 Pulmonary/Chest: Effort normal and breath sounds with few RLL rales or wheezing.                Abd:  Soft, NT, ND, + BS, no organomegaly               Neurological: Pt is alert. At baseline orientation, motor grossly intact               Skin: Skin is warm. No rashes, no other new lesions, LE edema - trace to 1+ bilat               Psychiatric: Pt behavior is normal without agitation   Micro: none  Cardiac tracings I have personally interpreted today:  none  Pertinent Radiological findings (summarize): none   Lab Results  Component Value Date   WBC 5.8 03/01/2023   HGB 9.2 (L) 03/01/2023   HCT 30.0 (L) 03/01/2023   PLT 238 03/01/2023   GLUCOSE 94 03/14/2023   CHOL 102 03/14/2023   TRIG 102.0 03/14/2023   HDL 30.10 (L) 03/14/2023   LDLCALC 51 03/14/2023   ALT 14 03/14/2023   AST 27 03/14/2023   NA 138 03/14/2023   K 4.1 03/14/2023   CL 100 03/14/2023   CREATININE 1.52 (H) 03/14/2023   BUN 20 03/14/2023   CO2 29 03/14/2023   TSH 5.01 09/25/2022   INR 1.7 (H) 11/01/2020   HGBA1C 6.5 03/14/2023   MICROALBUR 1.3 03/15/2022   Assessment/Plan:  Mary Mccarthy is a 83 y.o. White or Caucasian [1] female with  has a past medical history of ALLERGIC RHINITIS (06/24/2007), Anemia, AVM (arteriovenous malformation) of colon, Blood transfusion without reported diagnosis (05/2015), COLONIC POLYPS, HX OF (06/24/2007), Coronary artery disease, non-occlusive (05/2015), DEPRESSION (10/08/2006), DIABETES MELLITUS, TYPE II (10/05/2006), GERD (06/24/2007), History of CVA (cerebrovascular accident) (08/10/2020), HYPERLIPIDEMIA (10/08/2006), HYPERTENSION (10/08/2006), INSOMNIA (09/24/2008), Left knee DJD, Nonsustained paroxysmal  ventricular tachycardia (HCC) (10/2018), Osteoporosis (03/10/2016), PAF (paroxysmal atrial fibrillation) (HCC), PAF with RVR: CHA2DS2-VASc 9.  On Eliquis  and amiodarone . (08/11/2020), PEPTIC ULCER DISEASE (10/08/2006), S/P TAVR (transcatheter aortic valve replacement) (10/26/2020), and Severe aortic stenosis (08/2020).  Acute on chronic diastolic CHF (congestive heart failure) (HCC) Stable overall, but for bnp and cxr with evidence for few RLL rales and small leg swelling ; cont current med tx  CKD (chronic kidney disease), stage III (HCC) Lab Results  Component Value Date   CREATININE 1.52 (H) 03/14/2023   Stable overall, cont to avoid nephrotoxins  Followup: No follow-ups on file.  Lynwood Rush, MD 03/18/2023 4:47 PM [facility] - [facility] Internal Medicine

## 2023-03-14 NOTE — Patient Instructions (Addendum)
 Please continue all other medications as before, including the xanax  as needed  Please have the pharmacy call with any other refills you may need.  Please continue your efforts at being more active, low cholesterol diet, and weight control  Please keep your appointments with your specialists as you may have planned  Please go to the XRAY Department in the first floor for the x-ray testing  Please go to the LAB at the blood drawing area for the tests to be done  You will be contacted by phone if any changes need to be made immediately.  Otherwise, you will receive a letter about your results with an explanation, but please check with MyChart first.  Please make an Appointment to return Feb 19, or sooner if needed

## 2023-03-16 ENCOUNTER — Ambulatory Visit: Payer: Self-pay | Admitting: Licensed Clinical Social Worker

## 2023-03-16 NOTE — Patient Outreach (Signed)
  Care Coordination   Initial Visit Note   03/16/2023 Name: Mary Mccarthy MRN: [id number] DOB: 1940-09-17  Mary Mccarthy is a 83 y.o. year old female who sees Mary Mccarthy ORN, MD for primary care. I spoke with  Mary Mccarthy by phone today.  What matters to the patients health and wellness today?  Mary Mccarthy denies depressed mood. Mary Mccarthy reports son is residing in the home to assist. Mary Mccarthy reports no concerns with utility and rent payments.    Goals Addressed   None     SDOH assessments and interventions completed:  Yes  SDOH Interventions Today    Flowsheet Row Most Recent Value  SDOH Interventions   Food Insecurity Interventions Intervention Not Indicated  [Pt reports son goes grocery shopping for her. Pt reports she has not immediate food needs.]  Housing Interventions Intervention Not Indicated  [Reports rent increased by $100.00. Patient reports she is not ask risk for lossing residence.]  Utilities Interventions Intervention Not Indicated  [Pt reports receiving 300.00 from [employer] program to assist with utility payments.]        Care Coordination Interventions:  Yes, provided  Interventions Today    Flowsheet Row Most Recent Value  General Interventions   General Interventions Discussed/Reviewed Durable Medical Equipment (DME)  Durable Medical Equipment (DME) Oxygen   [Mary Mccarthy had specific questions regarding her oxygen . I advised Mary Mccarthy to continue using oxygen  according to directive from order physican. Mary Mccarthy is request use of a smaller unit. Message sent to Dr. Lynwood office to contact patient.]  Mental Health Interventions   Mental Health Discussed/Reviewed Mental Health Reviewed  [Mary Mccarthy reports no mental health concerns. Denies depressed mood and social isolation. Mary Mccarthy reports son lives with her and family is nearby and supportive.]  Safety Interventions   Safety Discussed/Reviewed Safety Discussed, Safety Reviewed   [Mary Mccarthy utilize campbell soup in the bathroom, rollator to ambulate.Mary Mccarthy reports she takes showers when son or family member is present in the home.]       Follow up plan: No further intervention required.   Encounter Outcome:  Patient Visit Completed   Alfonso Rummer MSW, LCSW Licensed Clinical Social Worker  [facility], Population Health Direct Dial: [phone]  Fax: [phone]

## 2023-03-16 NOTE — Patient Instructions (Signed)
 Visit Information  Thank you for taking time to visit with me today. Please don't hesitate to contact me if I can be of assistance to you.   Following are the goals we discussed today:   Goals Addressed   None        Please call the care guide team at [phone] if you need to cancel or reschedule your appointment.   If you are experiencing a Mental Health or Behavioral Health Crisis or need someone to talk to, please call 911   Patient verbalizes understanding of instructions and care plan provided today and agrees to view in MyChart. Active MyChart status and patient understanding of how to access instructions and care plan via MyChart confirmed with patient.     The patient has been provided with contact information for the care management team and has been advised to call with any health related questions or concerns.   Gwyndolyn Saxon MSW, LCSW Licensed Clinical Social Worker  [facility], Population Health Direct Dial: [phone]  Fax: [phone]

## 2023-03-18 ENCOUNTER — Encounter: Payer: Self-pay | Admitting: Internal Medicine

## 2023-03-18 DIAGNOSIS — N183 Chronic kidney disease, stage 3 unspecified: Secondary | ICD-10-CM | POA: Insufficient documentation

## 2023-03-18 NOTE — Assessment & Plan Note (Signed)
 Lab Results  Component Value Date   CREATININE 1.52 (H) 03/14/2023   Stable overall, cont to avoid nephrotoxins

## 2023-03-18 NOTE — Assessment & Plan Note (Signed)
 Stable overall, but for bnp and cxr with evidence for few RLL rales and small leg swelling ; cont current med tx

## 2023-03-21 ENCOUNTER — Other Ambulatory Visit: Payer: Self-pay | Admitting: *Deleted

## 2023-03-21 NOTE — Patient Instructions (Signed)
Visit Information  Thank you for taking time to visit with me today. Please don't hesitate to contact me if I can be of assistance to you before our next scheduled telephone appointment.  Our next appointment is by telephone on Thursday 03/29/23 at 11:00 am  Please call the care guide team at [phone] if you need to cancel or reschedule your appointment.   Following are the goals we discussed today:  Patient Goals/Self-Care Activities: Participate in Transition of Care Program/Attend TOC scheduled calls Take all medications as prescribed Attend all scheduled provider appointments Call provider office for new concerns or questions  Continue pacing activity to avoid episodes of shortness of breath Continue using home oxygen as prescribed Weigh yourself every day to stay on top of early fluid retention: write down your weights every day so you remember what it is from day to day- and take these values from home to your doctor appointments with you Continue to follow your established action plan for episodes of shortness of breath Eat a heart healthy and low salt diet Use assistive devices (your walker and cane) as needed to prevent falls Please PROMPTLY go to your PCP for an urgent office visit OR to the Urgent Care Center-- to have your concerns around possibly having a UTI evaluated and addressed  If you are experiencing a Mental Health or Behavioral Health Crisis or need someone to talk to, please  call the Suicide and Crisis Lifeline: 988 call the [place] National Suicide Prevention Lifeline: (682)329-0378 or TTY: 307-583-5929 TTY 586-286-1751) to talk to a trained counselor call 1-800-273-TALK (toll free, 24 hour hotline) go to [facility] [address] [place] [phone]) call the [facility] Crisis Line: 437-568-5854 call 911   Patient verbalizes understanding of instructions and care plan provided today and agrees to view in  MyChart. Active MyChart status and patient understanding of how to access instructions and care plan via MyChart confirmed with patient.     Caryl Pina, RN, BSN, Media planner  Transitions of Care  [facility] [phone]: direct office

## 2023-03-21 NOTE — Patient Outreach (Signed)
Care Management  Transitions of Care Program Transitions of Care Post-discharge week 3/ day # 16   03/21/2023 Name: Mary Mccarthy MRN: [id number] DOB: Nov 03, 1940  Subjective: Mary Mccarthy is a 83 y.o. year old female who is a primary care patient of Corwin Levins, MD. The Care Management team Engaged with patient Engaged with patient by telephone to assess and address transitions of care needs.   Consent to Services:  Patient was given information about care management services, agreed to services, and gave verbal consent to participate.  Enrolled 03/05/23  Assessment: "I am doing much better and still getting stronger; the only thing going on right now is that I think I might be coming down with a UTI; my urine is burning a little bit and I seem to be going to the bathroom frequently, but not much is coming out when I go to pee; I will go to the [facility] as you have advised.  I don't want to have to go back to the hospital for any reason.  My weights are holding steady at 186 lbs, not going up or down.  My fasting blood sugars are just fine- they are in the 120-130's"    She was advised to contact PCP office to schedule acute visit OR to go to [facility] to have her symptoms of possible UTI evaluated/ addressed re: clinical concerns voiced today- she verbalizes understanding and agreement with this plan; patient sounds to be in no distress throughout [facility] 30-day program outreach call today          SDOH Interventions    Flowsheet Row Care Coordination from 03/16/2023 in [facility] Community Care Coordination Telephone from 03/05/2023 in [place] HEALTH POPULATION HEALTH DEPARTMENT ED to Hosp-Admission (Discharged) from 02/28/2023 in [facility] 3E HF PCU Clinical Support from 05/11/2022 in [facility] at [facility] Chronic Care Management from 08/03/2021 in [facility] at [facility] Clinical Support from 05/09/2021 in [facility] at [place]  SDOH Interventions        Food Insecurity Interventions Intervention Not Indicated  [Pt reports son goes grocery shopping for her. Pt reports she has not immediate food needs.] Intervention Not Indicated  [Today, patient denies food insecurity: she confirms she received printed resources on AVS from hospital discharge on 03/03/23] Inpatient TOC, Community Resources Provided Intervention Not Indicated Intervention Not Indicated  [continues to deny food insecurity] Intervention Not Indicated  Housing Interventions Intervention Not Indicated  [Reports rent increased by $100.00. Patient reports she is not ask risk for lossing residence.] Other (Comment)  [Today, patient denies housing concerns-- however- she confirms she received printed resources on AVS from hospital discharge on 03/03/23] Inpatient TOC, Community Resources Provided Intervention Not Indicated -- Intervention Not Indicated  Transportation Interventions -- Intervention Not Indicated  [son/ nephew provide transportation] -- Intervention Not Indicated Intervention Not Indicated  [family/ nephew continues providing transportation] Intervention Not Indicated  Utilities Interventions Intervention Not Indicated  [Pt reports receiving 300.00 from [facility] program to assist with utility payments.] Other (Comment)  [Today, patient denies utility insecurity: she confirms she has heat/ water,  confirms she received printed resources on AVS from hospital discharge on 03/03/23] Inpatient TOC, Community Resources Provided Intervention Not Indicated -- --  Alcohol Usage Interventions -- -- -- Intervention Not Indicated (Score <7) -- --  Depression Interventions/Treatment  -- -- -- ZDG6-4 Score <4 Follow-up Not Indicated -- --  Financial Strain Interventions -- -- -- Intervention  Not Indicated -- Intervention Not Indicated  Physical Activity Interventions -- -- -- Patient Refused, Other (Comments) -- Patient Refused,  Intervention Not Indicated  Stress Interventions -- -- -- Intervention Not Indicated -- Intervention Not Indicated  Social Connections Interventions -- -- Inpatient TOC Intervention Not Indicated -- Intervention Not Indicated        Goals Addressed             This Visit's Progress    TOC 30-day Program Care Plan   On track    Current Barriers:  Medication management declined full medication review on initial TOC call: son manages most aspects of medications; is not present at home during [facility] call; patient unable to retrieve post-discharge AVS instructions to review Provider appointments facilitated HFU PCP OV for 03/14/23; patient needs cardiology provider appointment for HFU: confirmed she has contact information for cardiologist: she agrees to call/ schedule promptly Home Health services patient declined home health services post-hospital discharge Equipment/DME new to home O2 use; confirmed regularly uses assistive devices (walker) for ambulation Fragile state of health/ advanced age; good family support; patient is essentially independent in some self-care activities; family assists as indicated  RNCM Clinical Goal(s):  Patient will work with the Care Management team over the next 30 days to address Transition of Care Barriers: Medication Management Provider appointments take all medications exactly as prescribed and will call provider for medication related questions as evidenced by full medication review with patient and/ or son-caregiver during [facility] 30-day program weekly outreach phone calls attend all scheduled medical appointments: 03/14/23- PCP for hospital follow up; encouraged patient to schedule promptly with cardiology provider as evidenced by review of same with patient/ review of EHR/ collaboration with care providers as indicated, during [facility] 30-day program weekly outreach phone calls not experience hospital admission as evidenced by review of EMR. Hospital Admissions in  last 6 months = 1  through collaboration with RN Care manager, provider, and care team.   Interventions: Evaluation of current treatment plan related to  self management and patient's adherence to plan as established by provider  Transitions of Care:  Goal on track:  Yes. 03/21/23 Durable Medical Equipment (DME) needs assessed with patient/caregiver Doctor Visits  - discussed the importance of doctor visits Discussed current clinical condition:  she reports, "I am doing much better and still getting stronger; the only thing going on right now is that I think I might be coming down with a UTI; my urine is burning a little bit and I seem to be going to the bathroom frequently, but not much is coming out when I go to pee; I will go to the [facility] as you have advised.  I don't want to have to go back to the hospital for any reason.  My weights are holding steady at 186 lbs, not going up or down.  My fasting blood sugars are just fine- they are in the 120-130's;"  She was advised to contact PCP office to schedule acute visit OR to go to [facility] to have her symptoms of possible UTI evaluated/ addressed re: clinical concerns voiced today- she verbalizes understanding and agreement with this plan; patient sounds to be in no distress throughout [facility] 30-day program outreach call today Provided education around action plan for UTI symptoms: she is well-versed and verbalizes good baseline understanding: "I have been through it before;" encouraged to stay hydrated without over-loading on fluid intake: provided education around need to balance fluid intake in setting of CHF: she verbalizes good  understanding of same Re-confirmed uses assistive devices on regular basis, at baseline -- walker when she goes outside of home; cane when inside home Confirms using home O2 as per hospital discharge instructions: 2-3 L/min; denies issues/ concerns with home O2- however, she verbalizes "concern" with "how much the home O2 is going to  cost me after my insurance pays;" I encouraged her to contact her insurance provider to get her questions answered and she is agreeable to calling and denies need for assistance with his Confirmed patient is now consistently obtaining daily weights at home: she reports consistent daily weight ranges "between 185-187 lbs, right at 186 pretty much every day;" positive reinforcement provided for her consistently monitoring/ recording daily weights at home Reinforced previously provided education / rationale for daily weight monitoring at home as earliest indicator of fluid retention, along with weight gain guidelines/ action plan for weight gain; importance of taking diuretic as prescribed Reviewed upcoming scheduled provider office visits:  03/28/23: PCP follow up visit; 04/06/23: Cardiology provider: confirmed patient is aware of all and has plans to attend as scheduled Confirmed patient continues to endorse 100% medication adherence: she denies questions/ concerns around medications today; confirms no recent changes to medications Reviewed recent blood sugars at home: she reports "all are between 120-130;" and confirms she monitors/ records blood sugars every day fasting; reinforced previous discussion around most recent A1C value from 03/14/23: 6.5; she again verbalizes good understanding of same TOC 30-day program Initial assessment completed  Patient Goals/Self-Care Activities: Participate in Transition of Care Program/Attend TOC scheduled calls Take all medications as prescribed Attend all scheduled provider appointments Call provider office for new concerns or questions  Continue pacing activity to avoid episodes of shortness of breath Continue using home oxygen as prescribed Weigh yourself every day to stay on top of early fluid retention: write down your weights every day so you remember what it is from day to day- and take these values from home to your doctor appointments with you Continue to  follow your established action plan for episodes of shortness of breath Eat a heart healthy and low salt diet Use assistive devices (your walker and cane) as needed to prevent falls Please PROMPTLY go to your PCP for an urgent office visit OR to the Urgent Care Center-- to have your concerns around possibly having a UTI evaluated and addressed  Follow Up Plan:  Telephone follow up appointment with care management team member scheduled for:  Thursday, 03/29/23 at 11:00 am         Plan: Telephone follow up appointment with care management team member scheduled for:     Thursday, 03/29/23 at 11:00 am   Total time spent from review to signing of note/ including any care coordination interventions:  49 minutes  Caryl Pina, RN, BSN, Media planner  Transitions of Care  [facility] - [facility] [phone]: direct office

## 2023-03-24 ENCOUNTER — Other Ambulatory Visit: Payer: Self-pay | Admitting: Internal Medicine

## 2023-03-26 ENCOUNTER — Other Ambulatory Visit: Payer: Self-pay

## 2023-03-28 ENCOUNTER — Ambulatory Visit: Payer: 59 | Admitting: Internal Medicine

## 2023-03-29 ENCOUNTER — Other Ambulatory Visit: Payer: Self-pay | Admitting: Internal Medicine

## 2023-03-29 ENCOUNTER — Other Ambulatory Visit: Payer: Self-pay | Admitting: *Deleted

## 2023-03-29 ENCOUNTER — Encounter: Payer: Self-pay | Admitting: Internal Medicine

## 2023-03-29 MED ORDER — FUROSEMIDE 80 MG PO TABS: 80.0000 mg | ORAL_TABLET | Freq: Every day | ORAL | 11 refills | Status: DC

## 2023-03-29 NOTE — Patient Outreach (Signed)
Care Management  Transitions of Care Program Transitions of Care Post-discharge week 4/ day # 24   03/29/2023 Name: Mary Mccarthy MRN: [id number] DOB: 01-04-1941  Subjective: Mary Mccarthy is a 83 y.o. year old female who is a primary care patient of Corwin Levins, MD. The Care Management team Engaged with patient Engaged with patient by telephone to assess and address transitions of care needs.   Consent to Services:  Patient was given information about care management services, agreed to services, and gave verbal consent to participate.   Enrolled into 30-day TOC program 03/05/23  Assessment: "I am still doing fine, not having any problems; breathing is fine and I am using my oxygen all the time at 2 L/min.  I have not had any weight gain and my weights every day are staying between 185-187 lbs; this morning it was 186.8.  I don't understand why all of a sudden Dr. Jonny Ruiz wants me to increase the lasix.... I feel fine, and the chest x-ray was done 15 days ago.... my weights are stable.  I am afraid I will get dehydrated if I take 80 mg, and that I will never be able to leave the bathroom.  I want to see Dr. Jonny Ruiz before I increase the fluid pill, I am going to see him next week.  I promise, if my daily weights at home start to increase, or I get short of breath, I will take the higher dose"  Denies clinical concerns and sounds to be in no distress throughout [facility] 30-day program outreach call today          SDOH Interventions    Flowsheet Row Care Coordination from 03/16/2023 in [facility] Community Care Coordination Telephone from 03/05/2023 in [place] HEALTH POPULATION HEALTH DEPARTMENT ED to Hosp-Admission (Discharged) from 02/28/2023 in [facility] 3E HF PCU Clinical Support from 05/11/2022 in [facility] [place] [facility] Chronic Care Management from 08/03/2021 in [facility] at [facility] Clinical Support from 05/09/2021 in  [facility] at [place]  SDOH Interventions        Food Insecurity Interventions Intervention Not Indicated  [Pt reports son goes grocery shopping for her. Pt reports she has not immediate food needs.] Intervention Not Indicated  [Today, patient denies food insecurity: she confirms she received printed resources on AVS from hospital discharge on 03/03/23] Inpatient TOC, Community Resources Provided Intervention Not Indicated Intervention Not Indicated  [continues to deny food insecurity] Intervention Not Indicated  Housing Interventions Intervention Not Indicated  [Reports rent increased by $100.00. Patient reports she is not ask risk for lossing residence.] Other (Comment)  [Today, patient denies housing concerns-- however- she confirms she received printed resources on AVS from hospital discharge on 03/03/23] Inpatient TOC, Community Resources Provided Intervention Not Indicated -- Intervention Not Indicated  Transportation Interventions -- Intervention Not Indicated  [son/ nephew provide transportation] -- Intervention Not Indicated Intervention Not Indicated  [family/ nephew continues providing transportation] Intervention Not Indicated  Utilities Interventions Intervention Not Indicated  [Pt reports receiving 300.00 from [place] program to assist with utility payments.] Other (Comment)  [Today, patient denies utility insecurity: she confirms she has heat/ water,  confirms she received printed resources on AVS from hospital discharge on 03/03/23] Inpatient TOC, Community Resources Provided Intervention Not Indicated -- --  Alcohol Usage Interventions -- -- -- Intervention Not Indicated (Score <7) -- --  Depression Interventions/Treatment  -- -- -- OZH0-8 Score <4 Follow-up Not Indicated -- --  Financial Strain Interventions -- -- -- Intervention Not Indicated -- Intervention Not Indicated  Physical Activity Interventions -- -- -- Patient Refused, Other (Comments) -- Patient Refused,  Intervention Not Indicated  Stress Interventions -- -- -- Intervention Not Indicated -- Intervention Not Indicated  Social Connections Interventions -- -- Inpatient TOC Intervention Not Indicated -- Intervention Not Indicated       Goals Addressed             This Visit's Progress    TOC 30-day Program Care Plan   On track    Current Barriers:  Medication management declined full medication review on initial TOC call: son manages most aspects of medications; is not present at home during [facility] call; patient unable to retrieve post-discharge AVS instructions to review Provider appointments facilitated HFU PCP OV for 03/14/23; patient needs cardiology provider appointment for HFU: confirmed she has contact information for cardiologist: she agrees to call/ schedule promptly Home Health services patient declined home health services post-hospital discharge Equipment/DME new to home O2 use; confirmed regularly uses assistive devices (walker) for ambulation Fragile state of health/ advanced age; good family support; patient is essentially independent in some self-care activities; family assists as indicated  RNCM Clinical Goal(s):  Patient will work with the Care Management team over the next 30 days to address Transition of Care Barriers: Medication Management Provider appointments take all medications exactly as prescribed and will call provider for medication related questions as evidenced by full medication review with patient and/ or son-caregiver during [facility] 30-day program weekly outreach phone calls attend all scheduled medical appointments: 03/14/23- PCP for hospital follow up; encouraged patient to schedule promptly with cardiology provider as evidenced by review of same with patient/ review of EHR/ collaboration with care providers as indicated, during [facility] 30-day program weekly outreach phone calls not experience hospital admission as evidenced by review of EMR. Hospital Admissions in last  6 months = 1  through collaboration with RN Care manager, provider, and care team.   Interventions: Evaluation of current treatment plan related to  self management and patient's adherence to plan as established by provider  Transitions of Care:  Goal on track:  Yes. 03/29/23 Durable Medical Equipment (DME) needs assessed with patient/caregiver Doctor Visits  - discussed the importance of doctor visits Discussed current clinical condition:  she reports, "I am still doing fine, not having any problems; breathing is fine and I am using my oxygen all the time at 2 L/min.  I have not had any weight gain and my weights every day are staying between 185-187 lbs; this morning it was 186.8.  I don't understand why all of a sudden Dr. Jonny Ruiz wants me to increase the lasix.... I feel fine, and the chest x-ray was done 15 days ago.... my weights are stable.  I am afraid I will get dehydrated if I take 80 mg, and that I will never be able to leave the bathroom.  I want to see Dr. Jonny Ruiz before I increase the fluid pill, I am going to see him next week.  I promise, if my daily weights at home start to increase, or I get short of breath, I will take the higher dose" 03/29/23: Reviewed new dosing recommendation from PCP today, during TOC 30-day program outreach call-- to increase Lasix from 60 mg to 80 mg every day:  patient (and myself are confused around new instructions, given labs/ CXR were completed 2 weeks ago, with instructions not to make changes at that time:  secure chat messaging with PCP in real time: Dr. Jonny Ruiz advises that he does recommend increasing dose to 80 mg every day, and clarifies that results of CXR from 03/14/23 were just signed off yesterday, 03/28/23;  Dr. Jonny Ruiz agrees to allow patient to continue taking 60 mg every day until scheduled office visit on 04/03/23--- however:  I instructed patient to increase dose to 80 mg as advised if she begins to have increase in daily weights at home or shortness of  breath/ lower extremity swelling, prior to scheduled office visit: she is agreeable to this plan and is able to verbalize instructions back to me without prompting 03/29/23:  Reports during [facility] 30-day program outreach call- if PCP wishes her to continue taking newly prescribed medications of hydralizine/ isosorbide, as instructed post-hospital discharge on 03/03/23: she will need new Rx sent in at time of PCP OV 04/03/23-- she is also seeing cardiology provider on 04/06/23, but she states she believes she will run out of medication prior to cardiology visit- will make PCP aware as an FYI- notes added to her medication list accoridngly Discussed with patient her previous report from 03/21/23 re: signs/ symptoms UTI: today, she reports, "I took some of that OTC "azo" medicine, and it took care of my symptoms right away.  My symptoms are now completely gone;"  reinforced previously provided education around action plan for UTI symptoms: she is well-versed and verbalizes good baseline understanding: again encouraged patient to stay hydrated without over-loading on fluid intake: provided education around need to balance fluid intake in setting of CHF: she verbalizes good understanding of same Re-confirmed uses assistive devices on regular basis, at baseline -- walker when she goes outside of home; cane when inside home Confirms using home O2 as per hospital discharge instructions: 2-3 L/min; denies issues/ concerns with home O2-  Re- confirmed patient is consistently obtaining daily weights at home: she reports consistent daily weight ranges "between 185-187 lbs, right at 186 pretty much every day;" positive reinforcement provided for her consistently monitoring/ recording daily weights at home Reinforced previously provided education / rationale for daily weight monitoring at home as earliest indicator of fluid retention, along with weight gain guidelines/ action plan for weight gain; importance of taking diuretic as  prescribed Reviewed upcoming scheduled provider office visits:  04/03/23: PCP follow up visit; 04/06/23: Cardiology provider: confirmed patient is aware of all and has plans to attend as scheduled Confirmed patient continues to endorse 100% medication adherence: she denies questions/ concerns around medications today; confirms no recent changes to medications  Patient Goals/Self-Care Activities: Participate in Transition of Care Program/Attend TOC scheduled calls Take all medications as prescribed Attend all scheduled provider appointments Call provider office for new concerns or questions  Continue pacing activity to avoid episodes of shortness of breath Continue using home oxygen as prescribed Weigh yourself every day to stay on top of early fluid retention: write down your weights every day so you remember what it is from day to day- and take these values from home to your doctor appointments with you Continue to follow your established action plan for episodes of shortness of breath Eat a heart healthy and low salt diet Use assistive devices (your walker and cane) as needed to prevent falls  Follow Up Plan:  Telephone follow up appointment with care management team member scheduled for:  Wednesday, 04/04/23 at 11:00 am         Plan: Telephone follow up appointment with care management team member scheduled for:  Wednesday, 04/04/23 at 11:00 am  Total time spent from review to signing of note/ including any care coordination interventions:  87 mminutes  Caryl Pina, RN, BSN, Media planner  Transitions of Care  [facility] - [facility] [phone]: direct office

## 2023-03-29 NOTE — Patient Instructions (Signed)
Visit Information  Thank you for taking time to visit with me today. Please don't hesitate to contact me if I can be of assistance to you before our next scheduled telephone appointment.  Our next appointment is by telephone Wednesday 04/04/23 at 11:00 am  Please call the care guide team at [phone] if you need to cancel or reschedule your appointment.   Following are the goals we discussed today:  Patient Goals/Self-Care Activities: Participate in Transition of Care Program/Attend TOC scheduled calls Take all medications as prescribed Attend all scheduled provider appointments Call provider office for new concerns or questions  Continue pacing activity to avoid episodes of shortness of breath Continue using home oxygen as prescribed Weigh yourself every day to stay on top of early fluid retention: write down your weights every day so you remember what it is from day to day- and take these values from home to your doctor appointments with you Continue to follow your established action plan for episodes of shortness of breath Eat a heart healthy and low salt diet Use assistive devices (your walker and cane) as needed to prevent falls  If you are experiencing a Mental Health or Behavioral Health Crisis or need someone to talk to, please  call the Suicide and Crisis Lifeline: 988 call the [place] National Suicide Prevention Lifeline: (970)857-7900 or TTY: 669-888-0262 TTY (714) 733-0095) to talk to a trained counselor call 1-800-273-TALK (toll free, 24 hour hotline) go to [facility] [address] [place] [phone]) call the [facility] Crisis Line: ([phone] call 911   Patient verbalizes understanding of instructions and care plan provided today and agrees to view in MyChart. Active MyChart status and patient understanding of how to access instructions and care plan via MyChart confirmed with patient.     Caryl Pina, RN,  BSN, Media planner  Transitions of Care  [facility] [phone]: direct office

## 2023-04-03 ENCOUNTER — Ambulatory Visit (INDEPENDENT_AMBULATORY_CARE_PROVIDER_SITE_OTHER): Payer: 59 | Admitting: Internal Medicine

## 2023-04-03 ENCOUNTER — Encounter: Payer: Self-pay | Admitting: Internal Medicine

## 2023-04-03 VITALS — BP 132/62 | HR 54 | Temp 97.7°F | Ht 61.0 in | Wt 182.4 lb

## 2023-04-03 DIAGNOSIS — Z7984 Long term (current) use of oral hypoglycemic drugs: Secondary | ICD-10-CM

## 2023-04-03 DIAGNOSIS — I5033 Acute on chronic diastolic (congestive) heart failure: Secondary | ICD-10-CM | POA: Diagnosis not present

## 2023-04-03 DIAGNOSIS — E559 Vitamin D deficiency, unspecified: Secondary | ICD-10-CM

## 2023-04-03 DIAGNOSIS — I10 Essential (primary) hypertension: Secondary | ICD-10-CM | POA: Diagnosis not present

## 2023-04-03 DIAGNOSIS — N1832 Chronic kidney disease, stage 3b: Secondary | ICD-10-CM | POA: Diagnosis not present

## 2023-04-03 DIAGNOSIS — E538 Deficiency of other specified B group vitamins: Secondary | ICD-10-CM

## 2023-04-03 DIAGNOSIS — J9611 Chronic respiratory failure with hypoxia: Secondary | ICD-10-CM | POA: Diagnosis not present

## 2023-04-03 DIAGNOSIS — E1165 Type 2 diabetes mellitus with hyperglycemia: Secondary | ICD-10-CM

## 2023-04-03 DIAGNOSIS — J9610 Chronic respiratory failure, unspecified whether with hypoxia or hypercapnia: Secondary | ICD-10-CM | POA: Diagnosis not present

## 2023-04-03 MED ORDER — HYDRALAZINE HCL 25 MG PO TABS: 25.0000 mg | ORAL_TABLET | Freq: Three times a day (TID) | ORAL | 3 refills | Status: DC

## 2023-04-03 MED ORDER — FUROSEMIDE 20 MG PO TABS: ORAL_TABLET | ORAL | 3 refills | Status: DC

## 2023-04-03 MED ORDER — ISOSORBIDE MONONITRATE ER 30 MG PO TB24
15.0000 mg | ORAL_TABLET | Freq: Every day | ORAL | 3 refills | Status: DC
Start: 2023-04-03 — End: 2023-06-07

## 2023-04-03 NOTE — Assessment & Plan Note (Signed)
 Euvolemic by exam, cont current med tx, lasix 60 qd

## 2023-04-03 NOTE — Assessment & Plan Note (Signed)
 Conts home o2

## 2023-04-03 NOTE — Assessment & Plan Note (Signed)
 Lab Results  Component Value Date   VITAMINB12 1,075 (H) 03/14/2023   Stable, cont oral replacement - b12 1000 mcg qd

## 2023-04-03 NOTE — Progress Notes (Signed)
 Patient ID: Mary Mccarthy, female   DOB: 21-Aug-1940, 83 y.o.   MRN: [id number]        Chief Complaint: follow up diastolic CHF, low vit d, htn, dm, b12 deficiency       HPI:  Mary Mccarthy is a 83 y.o. female here with son, overall improved on cont'd lasix 60 mg per day, Pt denies chest pain, increased sob or doe, wheezing, orthopnea, PND, increased LE swelling, palpitations, dizziness or syncope.   Pt denies polydipsia, polyuria, or new focal neuro s/s.    Pt denies fever, wt loss, night sweats, loss of appetite, or other constitutional symptoms  no new complaints       Wt Readings from Last 3 Encounters:  04/03/23 182 lb 6.4 oz (82.7 kg)  03/14/23 186 lb (84.4 kg)  03/03/23 191 lb 12.8 oz (87 kg)   BP Readings from Last 3 Encounters:  04/03/23 132/62  03/14/23 132/70  03/03/23 (!) 129/44         Past Medical History:  Diagnosis Date   ALLERGIC RHINITIS 06/24/2007   Anemia    AVM (arteriovenous malformation) of colon    Blood transfusion without reported diagnosis 05/2015   COLONIC POLYPS, HX OF 06/24/2007   Coronary artery disease, non-occlusive 05/2015   Trop + w/ Acute Anemia =>CATH: small RI - Ostial 60%, ostial RCA 30% and dLAD 40-50%;; 9/'21: Cor Ca Score 624. Mild (25-49%) prox RCA & LAD,; Moderate (50-69%) Ostial Small RI & prox LCx.     DEPRESSION 10/08/2006   DIABETES MELLITUS, TYPE II 10/05/2006   GERD 06/24/2007   History of CVA (cerebrovascular accident) 08/10/2020   08/2020- found to be in afib with RVR, started on Eliquis (shower of emboli from A. fib as complication of severe AS)   HYPERLIPIDEMIA 10/08/2006   HYPERTENSION 10/08/2006   INSOMNIA 09/24/2008   Left knee DJD    Nonsustained paroxysmal ventricular tachycardia (HCC) 10/2018   Post-TAVR Zio Patch: Frequent (206) runs of NSVT - Fastest 5 beats 239 bpm. Longest 9.3 Sec avg 135 bpm. (? possibly Afib w/ aberrancy)   Osteoporosis 03/10/2016   PAF (paroxysmal atrial fibrillation) (HCC)     PAF with RVR: CHA2DS2-VASc 9.  On Eliquis and amiodarone. 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 Clifton Nam Vossler & Dr. Laneta Simmers   Severe aortic stenosis 08/2020   Progression from Mod-Severe AS to Severe AS - noted on Echo 08/2020 -> (mean gradient progressed from 34 to 42 mmHg) this was in setting of new onset A. fib RVR, acute diastolic HF and shower of emboli CVA. ->  Referred for TAVR, completed 10/26/2020   Past Surgical History:  Procedure Laterality Date   APPENDECTOMY     BIOPSY  10/21/2020   Procedure: BIOPSY;  Surgeon: Imogene Burn, MD;  Location: [facility] ENDOSCOPY;  Service: Gastroenterology;;   BREAST BIOPSY     CARDIAC CATHETERIZATION N/A 05/21/2015   Procedure: Left Heart Cath and Coronary Angiography;  Surgeon: [name], MD;  Location: [facility] INVASIVE CV LAB;  Service: Cardiovascular;: Ost RI 60%, Ost RCA 30%, dLAD tapers to small vessel w/ 40-50%. Mildly elevated LVEDP. Normal LV Fxn.   COLONOSCOPY N/A 05/20/2015   Procedure: COLONOSCOPY;  Surgeon: Ruffin Frederick, MD;  Location: [facility] ENDOSCOPY;  Service: Gastroenterology;  Laterality: N/A;   CORONARY CA2+ SCORE / CARDIAC CT ANGIOGRAM  10/09/2019   Calcium score 624.  82nd percentile. Dominant RCA: Mild (25-49%) proximal stenosis-distal bifurcation into PDA and PAV--< RPL branches.  LAD (1 major mid vessel diagonal) diffuse calcified plaque, mild proximal stenosis with minimal distal stenosis.  Small RI moderate ostial disease.  LCx-moderate mixed (50-69%) proximal stenosis.  Small dOM1 disease.  Trileaflet AoV, annular Ca2+ - probable AS   ESOPHAGOGASTRODUODENOSCOPY N/A 05/20/2015   Procedure: ESOPHAGOGASTRODUODENOSCOPY (EGD);  Surgeon: Ruffin Frederick, MD;  Location: [facility] ENDOSCOPY;  Service: Gastroenterology;  Laterality: N/A;   ESOPHAGOGASTRODUODENOSCOPY (EGD) WITH PROPOFOL N/A  10/21/2020   Procedure: ESOPHAGOGASTRODUODENOSCOPY (EGD) WITH PROPOFOL;  Surgeon: Imogene Burn, MD;  Location: [facility] ENDOSCOPY;  Service: Gastroenterology;  Laterality: N/A;   INTRAOPERATIVE TRANSTHORACIC ECHOCARDIOGRAM N/A 10/26/2020   Procedure: INTRAOPERATIVE TRANSTHORACIC ECHOCARDIOGRAM;  Surgeon: Clifton Aryeh Butterfield; Location: [facility] OR; Pre-TAVR well-visualized calcified AoV mean Grad 37 mmHg, AVA 0.72 cm => Post TAVR well-positioned supra-annular 26 mm Medtronic Evolut Pro valve placed with no PVL.  Mean gradient 14 mmHg.  AVR 1.58 cm.  Normal flow to RCA and LM-LAD.  ; EF 60 to 65%.  Degenerative severe MAC w/ mild MR.   RIGHT/LEFT HEART CATH AND CORONARY ANGIOGRAPHY N/A 09/29/2020   Procedure: RIGHT/LEFT HEART CATH AND CORONARY ANGIOGRAPHY;  Surgeon: Kathleene Hazel, MD;  Location: [facility] INVASIVE CV LAB;  Service: Cardiovascular; pre-TAVR:  pRCA 20%, m-dRCA 30%. D LM-pLAD 20%. RI 30%, mLAD 20%.  Severe AS with mean gradient measured at 52.8 mmHg.   TONSILLECTOMY     TRANSCATHETER AORTIC VALVE REPLACEMENT, TRANSFEMORAL N/A 10/26/2020   Procedure: TRANSCATHETER AORTIC VALVE REPLACEMENT, TRANSFEMORAL;  Surgeon: Kathleene Hazel, MD;  Location: [facility] OR;  Service: Open Heart Surgery;; Medtronic Evolut-Pro + (size 26 mm, model # EVPROPLUS -26US, serial # [id number]); transient high-grade AV block noted so PPM left in place.   TRANSTHORACIC ECHOCARDIOGRAM  03/17/2019   a) 03/2019: EF 60 to 65%.  Moderate LVH.  GRII DD.  Mod-Severe AS (m grad 36 mmHg, peak 59 mmHg); b) 09/2019: EF 65-70%, No RWMA. Gr1 DD/hi LAP, Mild hi PAP. Mod LA Dil. MOD AS (mean Grad 34.5 mmHg).  STABLE   TRANSTHORACIC ECHOCARDIOGRAM  08/27/2020   Admitted for CVA/Afib RVR & CHF (Progression to SEVERE SYMPTOMATIC AS):  Severe Calcific Aortic Stenosis (VTI AVA estimated 0.86 cm, mean gradient 42 mmHg, V-max 4.36 m/s).  EF 55 to 60%.  Severe concentric LVH.  Unable to determine diastolic parameters.  Moderately elevated PAP.  Mild LA  dilation.  Mild circumferential pericardial effusion.  Trivial MR.  Severe MAC.   TRANSTHORACIC ECHOCARDIOGRAM  11/25/2020   1 Month s/p TAVR: EF 55 to 60%.  No R WMA.  GR 1 DD.  Elevated LVEDP.  Normal RV size with mildly elevated RVP estimated 44 mmHg.  Oscillating density in the RV suspect calcified chordal apparatus versus calcified thrombus.  Moderate LA dilation.  Normal IVC/RA P => Well-positioned 26 mm Medtronic Evolut Pro THVwith mean AOV gradient 11 mmHg.  Trivial PVL   TRANSTHORACIC ECHOCARDIOGRAM  11/02/2020   a) Day 1 Post-op TAVR 10/27/20:  Well-Positioned Supra Annular Medtronic Evolut Pro THP.  Mean gradient 12 mmHg, peak 24 mmHg.  AVA 1.8 cm.  Trivial PVL.  EF 60 to 65%.  Normal LV function.  Mild LVH.  Severe LA dilation.  Mild RA dilation.  Severe MAC;; b) 11/02/20: Normal structure and function of the aortic valve prosthesis.  Mean gradient 9 mmHg (otherwise stable)   TUBAL LIGATION      reports that  she has never smoked. She has never used smokeless tobacco. She reports that she does not drink alcohol and does not use drugs. family history includes Alcohol abuse in an other family member; Arrhythmia in her sister; Arthritis in an other family member; CAD in her sister; Cancer in her mother; Depression in an other family member; Diabetes in an other family member; Heart attack (age of onset: 11) in her sister; Heart disease in an other family member; Hyperlipidemia in an other family member; Hypertension in an other family member; Stroke in an other family member. Allergies  Allergen Reactions   Ibuprofen Other (See Comments)    Bleeding events   Current Outpatient Medications on File Prior to Visit  Medication Sig Dispense Refill   acetaminophen (TYLENOL) 500 MG tablet Take 500 mg by mouth every 6 (six) hours as needed for moderate pain.     ALPRAZolam (XANAX) 0.25 MG tablet Take 1 tablet (0.25 mg total) by mouth 3 (three) times daily as needed for anxiety. 90 tablet 2    amiodarone (PACERONE) 200 MG tablet TAKE 1 TABLET BY MOUTH EVERY DAY 90 tablet 1   atorvastatin (LIPITOR) 40 MG tablet TAKE 1 TABLET BY MOUTH EVERY DAY 90 tablet 3   blood glucose meter kit and supplies Dispense based on patient and insurance preference. Use up to four times daily as directed. E11.9 1 each 0   Blood Glucose Monitoring Suppl (ONE TOUCH ULTRA 2) w/Device KIT Use as directed daily 1 each 0   citalopram (CELEXA) 20 MG tablet Take 1 tablet (20 mg total) by mouth daily. (Patient taking differently: Take 10 mg by mouth daily.) 90 tablet 3   [employer] VITAMIN B12 1000 MCG tablet TAKE 1 TABLET BY MOUTH EVERY DAY 90 tablet 3   Lancets (ONETOUCH DELICA PLUS LANCET33G) MISC USE AS DIRECTED TEST 1-2 TIMES A DAY 100 each 23   levothyroxine (SYNTHROID) 25 MCG tablet TAKE 1 TABLET BY MOUTH EVERY DAY 90 tablet 2   metFORMIN (GLUCOPHAGE) 1000 MG tablet TAKE 1 TABLET BY MOUTH IN THE AM AND 1/2 IN THE PM 135 tablet 1   metoprolol succinate (TOPROL-XL) 25 MG 24 hr tablet TAKE 1/2 TABLET BY MOUTH DAILY 45 tablet 3   mirtazapine (REMERON) 15 MG tablet TAKE 1 TABLET BY MOUTH EVERYDAY AT BEDTIME 90 tablet 3   ONETOUCH ULTRA test strip USE AS INSTRUCTED TEST 1-2 TIMES A DAY 100 strip 23   pantoprazole (PROTONIX) 40 MG tablet TAKE 1 TABLET BY MOUTH TWICE A DAY 180 tablet 1   solifenacin (VESICARE) 5 MG tablet Take 1 tablet (5 mg total) by mouth daily. 90 tablet 3   No current facility-administered medications on file prior to visit.        ROS:  All others reviewed and negative.  Objective        PE:  BP 132/62   Pulse (!) 54   Temp 97.7 F (36.5 C)   Ht 5\' 1"  (1.549 m)   Wt 182 lb 6.4 oz (82.7 kg)   SpO2 97%   BMI 34.46 kg/m                 Constitutional: Pt appears in NAD               HENT: Head: NCAT.                Right Ear: External ear normal.  Left Ear: External ear normal.                Eyes: . Pupils are equal, round, and reactive to light. Conjunctivae and EOM are  normal               Nose: without d/c or deformity               Neck: Neck supple. Gross normal ROM               Cardiovascular: Normal rate and regular rhythm.                 Pulmonary/Chest: Effort normal and breath sounds without rales or wheezing.                Abd:  Soft, NT, ND, + BS, no organomegaly               Neurological: Pt is alert. At baseline orientation, motor grossly intact               Skin: Skin is warm. No rashes, no other new lesions, LE edema - none               Psychiatric: Pt behavior is normal without agitation   Micro: none  Cardiac tracings I have personally interpreted today:  none  Pertinent Radiological findings (summarize): none   Lab Results  Component Value Date   WBC 5.8 03/01/2023   HGB 9.2 (L) 03/01/2023   HCT 30.0 (L) 03/01/2023   PLT 238 03/01/2023   GLUCOSE 94 03/14/2023   CHOL 102 03/14/2023   TRIG 102.0 03/14/2023   HDL 30.10 (L) 03/14/2023   LDLCALC 51 03/14/2023   ALT 14 03/14/2023   AST 27 03/14/2023   NA 138 03/14/2023   K 4.1 03/14/2023   CL 100 03/14/2023   CREATININE 1.52 (H) 03/14/2023   BUN 20 03/14/2023   CO2 29 03/14/2023   TSH 5.01 09/25/2022   INR 1.7 (H) 11/01/2020   HGBA1C 6.5 03/14/2023   MICROALBUR 1.3 03/15/2022   Assessment/Plan:  Mary Mccarthy is a 83 y.o. White or Caucasian [1] female with  has a past medical history of ALLERGIC RHINITIS (06/24/2007), Anemia, AVM (arteriovenous malformation) of colon, Blood transfusion without reported diagnosis (05/2015), COLONIC POLYPS, HX OF (06/24/2007), Coronary artery disease, non-occlusive (05/2015), DEPRESSION (10/08/2006), DIABETES MELLITUS, TYPE II (10/05/2006), GERD (06/24/2007), History of CVA (cerebrovascular accident) (08/10/2020), HYPERLIPIDEMIA (10/08/2006), HYPERTENSION (10/08/2006), INSOMNIA (09/24/2008), Left knee DJD, Nonsustained paroxysmal ventricular tachycardia (HCC) (10/2018), Osteoporosis (03/10/2016), PAF (paroxysmal atrial  fibrillation) (HCC), PAF with RVR: CHA2DS2-VASc 9.  On Eliquis and amiodarone. (08/11/2020), PEPTIC ULCER DISEASE (10/08/2006), S/P TAVR (transcatheter aortic valve replacement) (10/26/2020), and Severe aortic stenosis (08/2020).  Vitamin D deficiency Last vitamin D Lab Results  Component Value Date   VD25OH 9.22 (L) 03/14/2023   Low, to start oral replacement   Type 2 diabetes mellitus with hyperglycemia, without long-term current use of insulin (HCC) Lab Results  Component Value Date   HGBA1C 6.5 03/14/2023   Stable, pt to continue current medical treatment metfomrin 1000 am, 500 pm,    Essential hypertension BP Readings from Last 3 Encounters:  04/03/23 132/62  03/14/23 132/70  03/03/23 (!) 129/44   Stable, pt to continue medical treatment toprol xl 12.5 every day, hydralazine 25 tid   B12 deficiency Lab Results  Component Value Date   VITAMINB12 1,075 (H) 03/14/2023   Stable, cont oral replacement - b12 1000 mcg qd  CKD (chronic kidney disease), stage III (HCC) Lab Results  Component Value Date   CREATININE 1.52 (H) 03/14/2023   Stable overall, cont to avoid nephrotoxins   Chronic respiratory failure (HCC) Conts home o2  Acute on chronic diastolic CHF (congestive heart failure) (HCC) Euvolemic by exam, cont current med tx, lasix 60 qd Followup: Return in about 4 months (around 08/01/2023).  [name], MD 04/03/2023 10:09 PM [facility]

## 2023-04-03 NOTE — Assessment & Plan Note (Signed)
 Last vitamin D Lab Results  Component Value Date   VD25OH 9.22 (L) 03/14/2023   Low, to start oral replacement

## 2023-04-03 NOTE — Patient Instructions (Addendum)
 Please continue all other medications as before, and refills have been done if requested.  Please have the pharmacy call with any other refills you may need.  Please continue your efforts at being more active, low cholesterol diet, and weight control.  Please keep your appointments with your specialists as you may have planned - cardiology later this wk  Please make an Appointment to return in 4 months, or sooner if needed

## 2023-04-03 NOTE — Assessment & Plan Note (Signed)
 BP Readings from Last 3 Encounters:  04/03/23 132/62  03/14/23 132/70  03/03/23 (!) 129/44   Stable, pt to continue medical treatment toprol xl 12.5 every day, hydralazine 25 tid

## 2023-04-03 NOTE — Assessment & Plan Note (Signed)
 Lab Results  Component Value Date   CREATININE 1.52 (H) 03/14/2023   Stable overall, cont to avoid nephrotoxins

## 2023-04-03 NOTE — Assessment & Plan Note (Signed)
 Lab Results  Component Value Date   HGBA1C 6.5 03/14/2023   Stable, pt to continue current medical treatment metfomrin 1000 am, 500 pm,

## 2023-04-04 ENCOUNTER — Other Ambulatory Visit: Payer: Self-pay | Admitting: *Deleted

## 2023-04-04 NOTE — Patient Outreach (Signed)
 Care Management  Transitions of Care Program Transitions of Care Post-discharge week # 5/ day # 31 Case Closure   04/04/2023 Name: Mary Mccarthy MRN: [id number] DOB: [birth year]  Subjective: [name] is a 83 y.o. year old female who is a primary care patient of Corwin Levins, MD. The Care Management team Engaged with patient Engaged with patient by telephone to assess and address transitions of care needs.   Consent to Services:  Patient was given information about care management services, agreed to services, and gave verbal consent to participate.  Enrolled 04/04/23; TOC 30-day program case closure 04/04/23 Patient has successfully met/ accomplished her established goals for [facility] 30-day program without hospital readmission   Assessment: "I am still doing fine, not having any problems; had a real good visit with Dr. Jonny Ruiz yesterday, and he agreed to keep my fluid pill at 60 mg every day- I am so glad about that; I don't want to pull so much fluid off that I get dehydrated.  My weights are staying right where they should be, around 184.  I am still using the home oxygen, but am able to go short periods without it.  I will listen out for the new nurse to start calling me in 2 weeks on 04/18/23"  Denies clinical concerns and sounds to be in no distress throughout [facility] 30-day program outreach call today           SDOH Interventions    Flowsheet Row Care Coordination from 03/16/2023 in [facility] Community Care Coordination Telephone from 03/05/2023 in [facility] ED to Hosp-Admission (Discharged) from 02/28/2023 in [facility] 3E HF PCU Clinical Support from 05/11/2022 in [facility] Chronic Care Management from 08/03/2021 in [facility] Clinical Support from 05/09/2021 in [facility] [place]  SDOH Interventions        Food Insecurity  Interventions Intervention Not Indicated  [Pt reports son goes grocery shopping for her. Pt reports she has not immediate food needs.] Intervention Not Indicated  [Today, patient denies food insecurity: she confirms she received printed resources on AVS from hospital discharge on 03/03/23] Inpatient TOC, Community Resources Provided Intervention Not Indicated Intervention Not Indicated  [continues to deny food insecurity] Intervention Not Indicated  Housing Interventions Intervention Not Indicated  [Reports rent increased by $100.00. Patient reports she is not ask risk for lossing residence.] Other (Comment)  [Today, patient denies housing concerns-- however- she confirms she received printed resources on AVS from hospital discharge on 03/03/23] Inpatient TOC, Community Resources Provided Intervention Not Indicated -- Intervention Not Indicated  Transportation Interventions -- Intervention Not Indicated  [son/ nephew provide transportation] -- Intervention Not Indicated Intervention Not Indicated  [family/ nephew continues providing transportation] Intervention Not Indicated  Utilities Interventions Intervention Not Indicated  [Pt reports receiving 300.00 from [facility] program to assist with utility payments.] Other (Comment)  [Today, patient denies utility insecurity: she confirms she has heat/ water,  confirms she received printed resources on AVS from hospital discharge on 03/03/23] Inpatient TOC, Community Resources Provided Intervention Not Indicated -- --  Alcohol Usage Interventions -- -- -- Intervention Not Indicated (Score <7) -- --  Depression Interventions/Treatment  -- -- -- PHQ2-9 Score <4 Follow-up Not Indicated -- --  Financial Strain Interventions -- -- -- Intervention Not Indicated -- Intervention Not Indicated  Physical Activity Interventions -- -- -- Patient Refused, Other (Comments) -- Patient Refused, Intervention Not  Indicated  Stress Interventions -- -- -- Intervention Not Indicated --  Intervention Not Indicated  Social Connections Interventions -- -- Inpatient TOC Intervention Not Indicated -- Intervention Not Indicated        Goals Addressed             This Visit's Progress    TOC 30-day Program Care Plan   On track    Current Barriers:  Medication management declined full medication review on initial TOC call: son manages most aspects of medications; is not present at home during [facility] call; patient unable to retrieve post-discharge AVS instructions to review Provider appointments facilitated HFU PCP OV for 03/14/23; patient needs cardiology provider appointment for HFU: confirmed she has contact information for cardiologist: she agrees to call/ schedule promptly Home Health services patient declined home health services post-hospital discharge Equipment/DME new to home O2 use; confirmed regularly uses assistive devices (walker) for ambulation Fragile state of health/ advanced age; good family support; patient is essentially independent in some self-care activities; family assists as indicated  RNCM Clinical Goal(s):  Patient will work with the Care Management team over the next 30 days to address Transition of Care Barriers: Medication Management Provider appointments take all medications exactly as prescribed and will call provider for medication related questions as evidenced by full medication review with patient and/ or son-caregiver during [facility] 30-day program weekly outreach phone calls attend all scheduled medical appointments: 03/14/23- PCP for hospital follow up; encouraged patient to schedule promptly with cardiology provider as evidenced by review of same with patient/ review of EHR/ collaboration with care providers as indicated, during [facility] 30-day program weekly outreach phone calls not experience hospital admission as evidenced by review of EMR. Hospital Admissions in last 6 months = 1  through collaboration with RN Care manager, provider, and care team.    Interventions: Evaluation of current treatment plan related to  self management and patient's adherence to plan as established by provider  Transitions of Care:  Goal Met. 04/04/23 Durable Medical Equipment (DME) needs assessed with patient/caregiver Doctor Visits  - discussed the importance of doctor visits Discussed current clinical condition:  she reports, "I am still doing fine, not having any problems; had a real good visit with Dr. Jonny Ruiz yesterday, and he agreed to keep my fluid pill at 60 mg every day- I am so glad about that; I don't want to pull so much fluid off that I get dehydrated.  My weights are staying right where they should be, around 184.  I am still using the home oxygen, but am able to go short periods without it.  I will listen out for the new nurse to start calling me in 2 weeks on 04/18/23"  denies specific clinical concerns today Re-confirmed uses assistive devices on regular basis, at baseline -- walker when she goes outside of home; cane when inside home Confirms using home O2 as per hospital discharge instructions: 2-3 L/min; denies issues/ concerns with home O2- reports she is now able to tolerate short periods of time when she can take it off, but continues to endorse using "most of the time" Re- confirmed patient is consistently obtaining daily weights at home: she reports consistent daily weight ranges "between 183-186 lbs, today it was 184.4 lbs" positive reinforcement provided for her consistently monitoring/ recording daily weights at home Reinforced previously provided education / rationale for daily weight monitoring at home as earliest indicator of fluid retention, along with weight gain guidelines/ action plan for weight gain; importance  of taking diuretic as prescribed: patient is able to verbalize independently without prompting Reviewed scheduled provider office visits:  04/03/23: PCP follow up visit- patient verbalizes good understanding of post- office visit  instructions and confirms no medication changes/ concerns post- visit; confirms she has scheduled follow up appointment 08/01/23; confirms she understands to call sooner if problems or concerns arise prior to next scheduled visit  Reviewed upcoming scheduled provider office visit on 04/06/23: Cardiology provider: confirmed patient is aware of all and has plans to attend as scheduled Confirmed patient continues to endorse 100% medication adherence: she denies questions/ concerns around medications today; confirms no recent changes to medications TOC 30--day outreach completed; patient has successfully met/ accomplished her established goals for [facility] 30-day program without hospital readmission and was scheduled for ongoing follow up with longitudinal RN CM for telephone visit on 04/18/23  Patient Goals/Self-Care Activities: Take all medications as prescribed Attend all scheduled provider appointments Call provider office for new concerns or questions  Continue pacing activity to avoid episodes of shortness of breath Continue using home oxygen as prescribed Weigh yourself every day to stay on top of early fluid retention: write down your weights every day so you remember what it is from day to day- and take these values from home to your doctor appointments with you Continue to follow your established action plan for episodes of shortness of breath Eat a heart healthy and low salt diet Use assistive devices (your walker and cane) as needed to prevent falls  Follow Up Plan:  Telephone follow up appointment with care management team member scheduled for:  Wednesday, 04/18/23 at 10:30 am          Plan: Telephone follow up appointment with care management team member scheduled for:   Wednesday, 04/18/23 at 10:30 am   Total time spent from review to signing of note/ including any care coordination interventions: 41 minutes  Pls call/ message for questions,  Caryl Pina, RN, BSN, CCRN  Alumnus RN Care Manager  Transitions of Care  [facility] - [facility] [phone]: direct office

## 2023-04-04 NOTE — Patient Instructions (Signed)
 Visit Information  Thank you for taking time to visit with me today. Please don't hesitate to contact me if I can be of assistance to you before our next scheduled telephone appointment.  Your next appointment is by telephone on Wednesday 04/18/23 at 10:30 am with nurse Donn Pierini  Please call the care guide team at [phone] if you need to cancel or reschedule your appointment.   Following are the goals we discussed today:  Patient Goals/Self-Care Activities: Take all medications as prescribed Attend all scheduled provider appointments Call provider office for new concerns or questions  Continue pacing activity to avoid episodes of shortness of breath Continue using home oxygen as prescribed Weigh yourself every day to stay on top of early fluid retention: write down your weights every day so you remember what it is from day to day- and take these values from home to your doctor appointments with you Continue to follow your established action plan for episodes of shortness of breath Eat a heart healthy and low salt diet Use assistive devices (your walker and cane) as needed to prevent falls  If you are experiencing a Mental Health or Behavioral Health Crisis or need someone to talk to, please  call the Suicide and Crisis Lifeline: 988 call the [place] National Suicide Prevention Lifeline: 757-392-7394 or TTY: 272 079 1830 TTY (671) 230-1066) to talk to a trained counselor call 1-800-273-TALK (toll free, 24 hour hotline) go to [facility] [address], [place] [phone]) call the [facility] Crisis Line: [phone] call 911   Patient verbalizes understanding of instructions and care plan provided today and agrees to view in MyChart. Active MyChart status and patient understanding of how to access instructions and care plan via MyChart confirmed with patient.     Pls call/ message for questions,  Caryl Pina, RN, BSN, CCRN  Alumnus RN Care Manager  Transitions of Care  [facility] - [facility] [phone]: direct office

## 2023-04-06 ENCOUNTER — Ambulatory Visit: Payer: 59 | Attending: Physician Assistant | Admitting: Physician Assistant

## 2023-04-06 ENCOUNTER — Telehealth: Payer: Self-pay | Admitting: *Deleted

## 2023-04-06 ENCOUNTER — Encounter: Payer: Self-pay | Admitting: Physician Assistant

## 2023-04-06 VITALS — BP 122/60 | Ht 61.0 in | Wt 185.2 lb

## 2023-04-06 DIAGNOSIS — I5032 Chronic diastolic (congestive) heart failure: Secondary | ICD-10-CM

## 2023-04-06 DIAGNOSIS — J9611 Chronic respiratory failure with hypoxia: Secondary | ICD-10-CM

## 2023-04-06 DIAGNOSIS — Z952 Presence of prosthetic heart valve: Secondary | ICD-10-CM

## 2023-04-06 DIAGNOSIS — I2510 Atherosclerotic heart disease of native coronary artery without angina pectoris: Secondary | ICD-10-CM | POA: Diagnosis not present

## 2023-04-06 DIAGNOSIS — I10 Essential (primary) hypertension: Secondary | ICD-10-CM

## 2023-04-06 DIAGNOSIS — I480 Paroxysmal atrial fibrillation: Secondary | ICD-10-CM | POA: Diagnosis not present

## 2023-04-06 DIAGNOSIS — I5033 Acute on chronic diastolic (congestive) heart failure: Secondary | ICD-10-CM | POA: Diagnosis not present

## 2023-04-06 NOTE — Telephone Encounter (Signed)
 LM requesting call back from [employer]. Harrell Lark, PA, ordered portable oxygen concentrator for patient.

## 2023-04-06 NOTE — Telephone Encounter (Signed)
 Spoke with patient to advise that I left a message for [employer] local office earlier today. Will try again at number she provided on Monday. Pt verbalized agreement with plan and appreciation of call.

## 2023-04-06 NOTE — Patient Instructions (Addendum)
 Medication Instructions:  NO CHANGES *If you need a refill on your cardiac medications before your next appointment, please call your pharmacy*   Lab Work: BMP TODAY If you have labs (blood work) drawn today and your tests are completely normal, you will receive your results only by: MyChart Message (if you have MyChart) OR A paper copy in the mail If you have any lab test that is abnormal or we need to change your treatment, we will call you to review the results.   Testing/Procedures: NO TESTING   Follow-Up: At [facility], you and your health needs are our priority.  As part of our continuing mission to provide you with exceptional heart care, we have created designated Provider Care Teams.  These Care Teams include your primary Cardiologist (physician) and Advanced Practice Providers (APPs -  Physician Assistants and Nurse Practitioners) who all work together to provide you with the care you need, when you need it.  Your next appointment:   3 month(s)  Provider:   Bryan Lemma, MD or Azalee Course, [place]   Other Instructions

## 2023-04-06 NOTE — Telephone Encounter (Signed)
 Patient is calling stating her son gave her the number to contact [employer] [facility] regarding the order.   She states it is [phone]. Please advise.

## 2023-04-06 NOTE — Progress Notes (Signed)
 Cardiology Office Note:  .   Date:  04/06/2023  ID:  [name] [birth year], MRN [id number] PCP: Corwin Levins, MD  [facility] Providers Cardiologist:  Bryan Lemma, MD Electrophysiologist:  Thurmon Fair, MD     History of Present Illness: .   Mary Mccarthy is a 83 y.o. female with PMH of PAF, hypertension, hyperlipidemia, DM2, nonobstructive CAD, PUD/AVM with recurrent GI bleed, iron deficiency anemia, CVA, chronic diastolic heart failure and history of TAVR 10/28/2018.  She is not on anticoagulation therapy despite history of TIA, CVA and PAF due to recurrent GI bleed. Patient was admitted in 2017 with urosepsis, AKI and acute anemia with hemoglobin of 5 requiring blood transfusion.  EGD and colonoscopy showed several areas of potential GI bleed but no current bleeding.  She was found to have AVM and peptic ulcer disease.  She was admitted in July 2022 with a stroke and diagnosed with new atrial fibrillation at the time.  MRI of the brain showed small acute infarct diffusely bilaterally.  She was started on Eliquis.  Echocardiogram at the time showed EF 55 to 60%, moderate elevated pulmonary hypertension, severe concentric LVH, severe mitral annular calcification and severe aortic stenosis with mean gradient 42 mmHg.  She was readmitted in August 2022 with acute on chronic diastolic heart failure in the setting of severe aortic stenosis.  She underwent IV diuresis.  Hospital course complicated by A-fib with RVR however she converted to sinus rhythm on IV amiodarone.  She was seen by structural heart clinic and underwent left and right heart cath which showed nonobstructive CAD with severe aortic stenosis.  She underwent pre-TAVR workup as outpatient, however readmitted in September 2022 with symptomatic bradycardia and the GI bleed.  Hemoglobin was 7.2 on arrival and the patient required blood transfusion.  EGD showed no sign of active bleeding but stigmata of  recent bleeding.  It was suspected the patient's anemia and the melena was due to bleeding from the gastric erosions in the setting of Eliquis use.  Colonoscopy deferred due to severe aortic stenosis and the risk of sedation.  She underwent successful TAVR with 26 mm Medtronic Evolut Pro+ THV via TF approach in September 2022.  She developed complete heart block intraoperatively.  Postprocedure echocardiogram showed EF 60%, normal functioning TAVR valve with mean gradient of 12 mmHg, trivial perivalvular leakage.  She was discharged on Eliquis without concurrent anticoagulation therapy given GI bleed.  Heart monitor after discharge did not show any heart block or bradycardia.  She was readmitted near the end of September 2022 with nausea, vomiting, diarrhea, and AKI.  She had lactic acidosis improved with IV fluid.  Hemoglobin 8-9.  Repeat echocardiogram on 11/02/2020 showed EF 60 to 65%, 26 mm sapient prosthetic TAVR valve present in the aortic position, TAVR valve had normal structure and function.  Telemetry showed intermittent A-fib with a ventricular rate up to 130s and 9 bpm of nonsustained VT.  She was started on low-dose Toprol-XL.  Later she had recurrent anemia with hemoglobin dropped down to 7.6, Eliquis stopped.  She was referred to Dr. Excell Seltzer for consideration of Watchman procedure however it was decided to start her back on lower dose of Eliquis at 2.5 mg twice a day.  She was ultimately felt not to be a watchman candidate and anticoagulation therapy was discontinued due to risk of bleeding.  She was last seen by Dr. Herbie Baltimore in June 2024 for at which time she was doing well.  Repeat echocardiogram obtained on 11/13/2022 showed EF 60 to 65%, no regional wall motion abnormality, grade 2 DD, severe LAE, moderately elevated PASP, 26 mm Medtronic TAVR valve with trivial AI and paravalvular leak.  She was in her usual state of health until January 2025 when she was readmitted to the hospital with shortness  of breath.  She started having shortness of breath around Christmas and URI symptoms.  On arrival, she had a look bilateral lower extremity edema.  Patient was admitted to hospitalist service, cardiology service was not consulted.  EKG at the time maintaining sinus rhythm.  She was diuresed to 4 L.  She was discharged on 60 mg daily of Lasix, I increased from the previous 40 mg daily.    She presents today for posthospital cardiology follow-up.  She has been compliant with 60 mg daily of Lasix since discharge.  Her weight is stable until a few days ago with her weight increased by roughly 3 pounds.  I discussed with the patient and I recommended daily weight.  She is aware to take extra 20 mg of Lasix if her weight increased by more than 3 pounds overnight or 5 pounds in a single week.  I recommend low-salt diet.  Daily fluid intake should be limited to 32 to 64 ounces per day.  She appears to be euvolemic today.  She has trace amount of lower extremity.  With her age, I am hesitant to increase the diuretic any further.  I recommend a basic metabolic panel today to follow-up on her renal function and electrolyte.  She can follow-up with Dr. Herbie Baltimore or me in 3 months.   ROS:   She denies chest pain, palpitations, pnd, orthopnea, n, v, dizziness, syncope, edema, weight gain, or early satiety. All other systems reviewed and are otherwise negative except as noted above.   She has chronic dyspnea  Studies Reviewed: Marland Kitchen   EKG Interpretation Date/Time:  Friday April 06 2023 13:40:11 EST Ventricular Rate:  55 PR Interval:  180 QRS Duration:  98 QT Interval:  486 QTC Calculation: 464 R Axis:   77  Text Interpretation: Sinus bradycardia  Poor R wave progression in the anterior leads Confirmed by Azalee Course [phone]) on 04/06/2023 1:51:46 PM    Cardiac Studies & Procedures   ______________________________________________________________________________________________ CARDIAC CATHETERIZATION  CARDIAC  CATHETERIZATION 09/29/2020  Narrative   Prox RCA lesion is 20% stenosed.   Mid RCA to Dist RCA lesion is 20% stenosed.   Ramus lesion is 30% stenosed.   Mid LAD lesion is 20% stenosed.   Dist LM to Prox LAD lesion is 20% stenosed.  Mild non-obstructive CAD Severe aortic stenosis (mean gradient 52.8 mmHg, peak to peak gradient 58 mmHg, AVA 0.90 cm2).  Recommendations: Will continue workup for TAVR  Findings Coronary Findings Diagnostic  Dominance: Right  Left Main Dist LM to Prox LAD lesion is 20% stenosed.  Left Anterior Descending Vessel is large. Mid LAD lesion is 20% stenosed.  Ramus Intermedius Ramus lesion is 30% stenosed.  Right Coronary Artery Vessel is large. Prox RCA lesion is 20% stenosed. Mid RCA to Dist RCA lesion is 20% stenosed.  Intervention  No interventions have been documented.   CARDIAC CATHETERIZATION  CARDIAC CATHETERIZATION 05/21/2015  Narrative Images from the original result were not included. 1. Ost Ramus lesion, 60% stenosed. 2. Ost RCA lesion, 30% stenosed. 3. Dist LAD lesion, tapers to a small vessel with diffuse roughly 40-50% stenosis. 4. The left ventricular systolic function is normal. 5. Elevated  LVEDP  No culprit lesion noted to explain the patient's elevated troponin. The most significant lesion in the ostial ramus intermedius. This is not PCI target  She has elevated LVEDP, but relatively preserved EF.  Plan:  Return to TCU for monitoring and continued care.  TR band removal per protocol.  Defer further cardiac evaluation and management to Dr. Rennis Golden.    [name], M.D., M.S. Interventional Cardiologist  Pager # [phone] Phone # [phone] [address] [place], [place] [address]  Findings Coronary Findings Diagnostic  Dominance: Right  Left Main . Vessel is large.  Left Anterior Descending . Vessel is large. Tapers to relatively small caliber vessel distally with diffuse apical  disease Diffuse. Vessel is a relatively small at this level with diffuse disease  First Diagonal Branch The vessel is moderate in size and is angiographically normal.  Lateral First Diagonal Branch The vessel is small in size.  First Septal Branch The vessel is small in size.  Second Septal Branch The vessel is small in size.  Third Septal Branch The vessel is small in size.  Ramus Intermedius . Vessel is moderate in size. Discrete located at the major branch.  Lateral Ramus Intermedius The vessel is small in size.  Left Circumflex . Vessel is angiographically normal.  First Obtuse Marginal Branch The vessel is angiographically normal.  Lateral First Obtuse Marginal Branch The vessel is small in size.  Right Coronary Artery . Vessel is large. Discrete.  Acute Marginal Branch The vessel is moderate in size.  First Right Posterolateral Branch The vessel is small in size.  Third Right Posterolateral Branch The vessel is small in size.  Intervention  No interventions have been documented.     ECHOCARDIOGRAM  ECHOCARDIOGRAM COMPLETE 11/13/2022  Narrative ECHOCARDIOGRAM REPORT    Patient Name:   JANNAT ROSEMEYER Date of Exam: 11/13/2022 Medical Rec #:  [id number]      Height:       62.0 in Accession #:    [id number]     Weight:       200.0 lb Date of Birth:  1940/02/16      BSA:          1.912 m Patient Age:    82 years       BP:           128/72 mmHg Patient Gender: F              HR:           45 bpm. Exam Location:  [address]  Procedure: 2D Echo, Cardiac Doppler and Color Doppler  Indications:    Z95.2 Post TAVR evaluation  History:        Patient has prior history of Echocardiogram examinations, most recent 11/09/2021. CAD, Stroke, Arrythmias:Atrial Fibrillation; Risk Factors:Hypertension and Dyslipidemia. Aortic stenosis. Morbid obesity. Aortic Valve: 26 mm Medtronic stented (TAVR) valve is present in the aortic position. Procedure Date:  10/26/2020.  Sonographer:    Cathie Beams RCS Referring Phys: [phone] [facility]   Sonographer Comments: Difficulty positioning patient on left side. IMPRESSIONS   1. Left ventricular ejection fraction, by estimation, is 60 to 65%. The left ventricle has normal function. The left ventricle has no regional wall motion abnormalities. There is mild concentric left ventricular hypertrophy. Left ventricular diastolic parameters are consistent with Grade II diastolic dysfunction (pseudonormalization). 2. Right ventricular systolic function was not well visualized. The right ventricular size is not well visualized. There is moderately elevated pulmonary  artery systolic pressure. 3. Left atrial size was severely dilated. 4. The mitral valve is grossly normal. Trivial mitral valve regurgitation. No evidence of mitral stenosis. Severe mitral annular calcification. 5. The aortic valve has been repaired/replaced. Aortic valve regurgitation is trivial. There is a 26 mm Medtronic stented (TAVR) valve present in the aortic position. Procedure Date: 10/26/2020. Echo findings are consistent with perivalvular leak of the aortic prosthesis. Aortic valve mean gradient measures 9.0 mmHg. 6. The inferior vena cava is normal in size with greater than 50% respiratory variability, suggesting right atrial pressure of 3 mmHg.  Comparison(s): No significant change from prior study.  FINDINGS Left Ventricle: Left ventricular ejection fraction, by estimation, is 60 to 65%. The left ventricle has normal function. The left ventricle has no regional wall motion abnormalities. The left ventricular internal cavity size was normal in size. There is mild concentric left ventricular hypertrophy. Left ventricular diastolic parameters are consistent with Grade II diastolic dysfunction (pseudonormalization).  Right Ventricle: The right ventricular size is not well visualized. Right vetricular wall thickness was not well  visualized. Right ventricular systolic function was not well visualized. There is moderately elevated pulmonary artery systolic pressure. The tricuspid regurgitant velocity is 3.49 m/s, and with an assumed right atrial pressure of 3 mmHg, the estimated right ventricular systolic pressure is 51.7 mmHg.  Left Atrium: Left atrial size was severely dilated.  Right Atrium: Right atrial size was not well visualized.  Pericardium: There is no evidence of pericardial effusion.  Mitral Valve: The mitral valve is grossly normal. Severe mitral annular calcification. Trivial mitral valve regurgitation. No evidence of mitral valve stenosis.  Tricuspid Valve: The tricuspid valve is normal in structure. Tricuspid valve regurgitation is mild . No evidence of tricuspid stenosis.  Aortic Valve: The aortic valve has been repaired/replaced. Aortic valve regurgitation is trivial. Aortic valve mean gradient measures 9.0 mmHg. Aortic valve peak gradient measures 20.0 mmHg. There is a 26 mm Medtronic stented (TAVR) valve present in the aortic position. Procedure Date: 10/26/2020.  Pulmonic Valve: The pulmonic valve was grossly normal. Pulmonic valve regurgitation is not visualized. No evidence of pulmonic stenosis.  Aorta: The aortic root, ascending aorta, aortic arch and descending aorta are all structurally normal, with no evidence of dilitation or obstruction.  Venous: The inferior vena cava is normal in size with greater than 50% respiratory variability, suggesting right atrial pressure of 3 mmHg.  IAS/Shunts: The interatrial septum was not well visualized.   LEFT VENTRICLE PLAX 2D LVIDd:         5.10 cm Diastology LVIDs:         2.30 cm LV e' medial:    4.62 cm/s LV PW:         1.20 cm LV E/e' medial:  31.8 LV IVS:        1.20 cm LV e' lateral:   9.68 cm/s LV E/e' lateral: 15.2   RIGHT VENTRICLE RV S prime:     11.60 cm/s  LEFT ATRIUM              Index LA diam:        5.80 cm  3.03 cm/m LA Vol  (A2C):   110.0 ml 57.54 ml/m LA Vol (A4C):   84.0 ml  43.94 ml/m LA Biplane Vol: 97.4 ml  50.95 ml/m AORTIC VALVE AV Vmax:           223.50 cm/s AV Vmean:          138.000 cm/s AV VTI:  0.544 m AV Peak Grad:      20.0 mmHg AV Mean Grad:      9.0 mmHg LVOT Vmax:         111.00 cm/s LVOT Vmean:        68.200 cm/s LVOT VTI:          0.282 m LVOT/AV VTI ratio: 0.52  AORTA Ao Asc diam: 3.30 cm  MITRAL VALVE                TRICUSPID VALVE MV Area (PHT): 3.30 cm     TR Peak grad:   48.7 mmHg MV Decel Time: 230 msec     TR Vmax:        349.00 cm/s MV E velocity: 147.00 cm/s MV A velocity: 92.00 cm/s   SHUNTS MV E/A ratio:  1.60         Systemic VTI: 0.28 m  Jodelle Red MD Electronically signed by Jodelle Red MD Signature Date/Time: 11/13/2022/3:33:00 PM    Final    MONITORS  LONG TERM MONITOR-LIVE TELEMETRY (3-14 DAYS) 11/19/2020  Narrative  Predominantly sinus rhythm with minimum heart rate of 49 bpm, maximum 114 bpm. Average 68 bpm.  Total of 206 (Nonsustained Ventricular Tachycardia-V. tach fastest 5 beats continue 39 bpm. Longest 9.3 seconds average rate 1 3 5  bpm.  A. fib burden (16%) rate range 62-139 bpm with average rate 105 bpm. Longest was for 17 hr 55 min. Some aberrant conduction suspected.  Rare isolated PACs and PVCs. Some PVC couplets and triplets were noted.  No high-grade AV block noted.  Bursts of V. tach could be A. fib with aberrancy.   Patch Wear Time:  12 days and 19 hours (2022-09-22T09:52:09-398 to 2022-10-05T05:32:17-0400)  Abnormal monitor.  Still shows a significant mount of atrial fibrillation and short burst of nonsustained V. tach.  Thankfully, no high-grade AV block.  I think we can continue amiodarone and probably titrate up beta-blocker.   Bryan Lemma, MD   CT SCANS  CT CORONARY MORPH W/CTA COR W/SCORE 09/28/2020  Addendum 09/29/2020  1:17 PM ADDENDUM REPORT: 09/29/2020 13:14  CLINICAL DATA:   Pre-op transcatheter aortic valve replacement (TAVR)  EXAM: Cardiac TAVR CT  TECHNIQUE: The patient was scanned on a Siemens Force 192 slice scanner. A 120 kV retrospective scan was triggered in the descending thoracic aorta at 111 HU's. Gantry rotation speed was 270 msecs and collimation was .9 mm. The 3D data set was reconstructed in 5% intervals of the R-R cycle. Systolic and diastolic phases were analyzed on a dedicated work station using MPR, MIP and VRT modes. The patient received 95mL OMNIPAQUE IOHEXOL 350 MG/ML SOLN of contrast.  FINDINGS: Aortic Valve: Tricuspid aortic valve. Severely reduced cusp separation. Severely thickened, moderately calcified aortic valve cusps.  AV calcium score: 1049  Virtual Basal Annulus Measurements:  Maximum/Minimum Diameter: 23.1 mm x 18.9 mm  Perimeter: 65.4 mm  Area: 328 mm2  No significant LVOT calcifications.  Based on these measurements, the annulus would be suitable for a 20 mm Edwards Sapien 3 valve vs 23 mm Medtronic Evolut Pro valve. Sinus dimensions are borderline for 26 mm Medtronic Evolut Pro valve. Recommend Heart Team discussion for valve sizing.  Sinus of Valsalva Measurements:  Non-coronary:  26 mm  Right - coronary:  26 mm  Left - coronary:  27 mm  Sinus of Valsalva Height:  Left: 17.5 mm  Right: 18.6 mm  Aorta: Mild aortic atherosclerosis.  Sinotubular Junction: 27 mm  Ascending Thoracic Aorta:  31 mm  Aortic Arch:  24 mm  Descending Thoracic Aorta:  22 mm  Coronary Artery Height above Annulus:  Left Main: 13 mm  Right Coronary: 11.6 mm  Coronary Arteries: Normal origins. 3 vessel coronary artery calcifications.  Optimum Fluoroscopic Angle for Delivery: RAO 1, CRA 1  Moderate mitral annular calcification.  No left atrial appendage thrombus.  IMPRESSION: 1. Tricuspid aortic valve. Severely reduced cusp separation. Severely thickened, moderately calcified aortic valve cusps.  2.  AV  calcium score: 1049  3. Annulus area: 328 mm2, no significant LVOT calcifications. Based on these measurements, the annulus would be suitable for a 20 mm Edwards Sapien 3 valve vs 23 mm Medtronic Evolut Pro valve. Sinus dimensions are borderline for 26 mm Medtronic Evolut Pro valve. Recommend Heart Team discussion for valve sizing.  4.  Sufficient coronary artery height from annulus.  5.  Optimum Fluoroscopic Angle for Delivery: RAO 1, CRA 1   Electronically Signed By: Weston Brass M.D. On: 09/29/2020 13:14  Narrative EXAM: OVER-READ INTERPRETATION  CT CHEST  The following report is an over-read performed by radiologist Dr. Trudie Reed of [facility] on 09/29/2020. This over-read does not include interpretation of cardiac or coronary anatomy or pathology. The coronary calcium score/coronary CTA interpretation by the cardiologist is attached.  COMPARISON:  Cardiac CTA 10/09/2019.  FINDINGS: Extracardiac findings will be described separately under dictation for contemporaneously obtained CTA chest, abdomen and pelvis.  IMPRESSION: Please see separate dictation for contemporaneously obtained CTA chest, abdomen and pelvis dated 09/28/2020 for full description of relevant extracardiac findings.  Electronically Signed: By: Trudie Reed M.D. On: 09/29/2020 06:07   CT SCANS  CT CORONARY FRACTIONAL FLOW RESERVE DATA PREP 10/09/2019  Narrative EXAM: CT FFR ANALYSIS  CLINICAL DATA:  Chest pain/anginal equiv, 11yr CHD risk 10-20%, not treadmill candidate  FINDINGS: FFRct analysis was performed on the original cardiac CT angiogram dataset. Diagrammatic representation of the FFRct analysis is provided in a separate PDF document in PACS. This dictation was created using the PDF document and an interactive 3D model of the results. 3D model is not available in the EMR/PACS. Normal FFR range is >0.80.  1. Left Main:  No significant stenosis. FFR =  0.99  2. LAD: No significant stenosis. Proximal FFR = 0.97, Mid FFR = 0.90, Distal FFR = 0.85 3. LCX: No significant stenosis. Proximal FFR = 0.98, Distal FFR = 0.91 4. RCA: No significant stenosis. Proximal FFR = 0.96, Mid FFR = 0.92, Distal FFR = 0.91  IMPRESSION: 1.  CT FFR analysis did not show any significant stenosis.   Electronically Signed By: Chrystie Nose M.D. On: 10/10/2019 23:18   CT CORONARY MORPH W/CTA COR W/SCORE 10/09/2019  Addendum 10/09/2019  9:12 PM ADDENDUM REPORT: 10/09/2019 21:10  HISTORY: 83 yo female with chest pain/anginal equiv, 9yr CHD risk 10-20%, not treadmill candidate  EXAM: Cardiac/Coronary CTA  TECHNIQUE: The patient was scanned on a [employer].  PROTOCOL: A 120 kV prospective scan was triggered in the descending thoracic aorta at 111 HU's. Axial non-contrast 3 mm slices were carried out through the heart. The data set was analyzed on a dedicated work station and scored using the Agatson method. Gantry rotation speed was 250 msecs and collimation was .6 mm. Beta blockade and 0.8 mg of sl NTG was given. The 3D data set was reconstructed in 5% intervals of the 67-82 % of the R-R cycle. Diastolic phases were analyzed on a dedicated work station using MPR, MIP  and VRT modes. The patient received 80mL OMNIPAQUE IOHEXOL 350 MG/ML SOLN of contrast.  FINDINGS: Quality: Fair, HR 59  Coronary calcium score: The patient's coronary artery calcium score is 624, which places the patient in the 82nd percentile.  Coronary arteries: Normal coronary origins.  Right dominance.  Right Coronary Artery: Dominant. Gives off large R-PDA and R-PLB branches. Mild 25-49% proximal mixed stenosis (CADRADS 2). No significant distal or branch disease.  Left Main Coronary Artery: Normal left main. Bifurcates into the LAD and LCx branches.  Left Anterior Descending Coronary Artery: Moderate sized anterior artery which gives off a mid-vessel  diagonal branch. There is diffuse calcified plaque with mild 25-49% proximal stenosis (CADRADS3) and minimal mixed mid to distal vessel stenosis (<25% - CADRADS1).  Ramus intermedius: Small branch <2.0 mm, mild to moderate ostial disease  Left Circumflex Artery: AV groove vessel. There is a moderate mixed 50-69% proximal stenosis (CADRADS3). Small distal OM branch without disease.  Aorta: Normal size, 30 mm at the mid ascending aorta (level of the PA bifurcation) measured double oblique. Aortic atherosclerosis. No dissection.  Aortic Valve: Trileaflet with leaflet and annular calcification. Probable aortic stenosis.  Other findings:  Normal pulmonary vein drainage into the left atrium.  Normal left atrial appendage without a thrombus.  Dilated main pulmonary artery to 28 mm, suggestive of pulmonary hypertension.  Mitral annular calcification  IMPRESSION: 1. Diffuse multivessel mixed CAD, possibly significant in the proximal LCx, CADRADS = 3. CT FFR will be performed and reported separately.  2. Coronary calcium score of 624. This was 82nd percentile for age and sex matched control.  3. Normal coronary origin with right dominance.  4. Dilated main pulmonary artery to 28 mm, suggestive of pulmonary hypertension.  5. Mitral annular calcification  6. Aortic annular and leaflet calcification, probable aortic stenosis   Electronically Signed By: Chrystie Nose M.D. On: 10/09/2019 21:10  Narrative EXAM: OVER-READ INTERPRETATION  CT CHEST  The following report is an over-read performed by radiologist Dr. Trudie Reed of [facility] on 10/09/2019. This over-read does not include interpretation of cardiac or coronary anatomy or pathology. The coronary calcium score/coronary CTA interpretation by the cardiologist is attached.  COMPARISON:  None.  FINDINGS: Aortic atherosclerosis. Within the visualized portions of the thorax there are no  suspicious appearing pulmonary nodules or masses, there is no acute consolidative airspace disease, no pleural effusions, no pneumothorax and no lymphadenopathy. Visualized portions of the upper abdomen are unremarkable. There are no aggressive appearing lytic or blastic lesions noted in the visualized portions of the skeleton.  IMPRESSION: 1.  Aortic Atherosclerosis (ICD10-I70.0).  Electronically Signed: By: Trudie Reed M.D. On: 10/09/2019 15:23     ______________________________________________________________________________________________      Risk Assessment/Calculations:    CHA2DS2-VASc Score = 7   This indicates a 11.2% annual risk of stroke. The patient's score is based upon: CHF History: 1 HTN History: 1 Diabetes History: 1 Stroke History: 0 Vascular Disease History: 1 Age Score: 2 Gender Score: 1            Physical Exam:   VS:  BP 122/60 (BP Location: Left Arm, Patient Position: Sitting, Cuff Size: Normal)   Ht 5\' 1"  (1.549 m)   Wt 185 lb 3.2 oz (84 kg)   SpO2 96%   BMI 34.99 kg/m    Wt Readings from Last 3 Encounters:  04/06/23 185 lb 3.2 oz (84 kg)  04/03/23 182 lb 6.4 oz (82.7 kg)  03/14/23 186 lb (84.4 kg)  GEN: Well nourished, well developed in no acute distress NECK: No JVD; No carotid bruits CARDIAC: RRR, no murmurs, rubs, gallops RESPIRATORY:  Clear to auscultation without rales, wheezing or rhonchi  ABDOMEN: Soft, non-tender, non-distended EXTREMITIES:  No edema; No deformity   ASSESSMENT AND PLAN: .    Chronic Diastolic Heart Failure Recent hospitalization with volume overload treated with diuresis. Current Lasix dose increased from 40mg  to 60mg  daily. Recent weight gain and trace edema noted. -Continue Lasix 60mg  daily. -Encourage daily weight monitoring and low salt diet. -If weight increases by more than 3 pounds overnight or 5 pounds in a week, take an extra 20mg  of Lasix. -Order repeat blood work to monitor kidney  function.  History of nonobstructive CAD: No recent chest pain  Oxygen Requirement Currently on as-needed oxygen therapy. Recent hospitalization showed O2 saturation dropping to 87% with walking. Patient expressed desire for portable oxygen concentrator. -Attempt to order portable oxygen concentrator for patient.  Paroxysmal Atrial Fibrillation History of recurrent GI bleed leading to discontinuation of anticoagulation therapy.  Continue amiodarone  Hypertension: Blood pressure well-controlled  Severe Aortic Stenosis s/p TAVR Successful TAVR with 26mm Medtronic Evolut Pro plus THV in September 2022. Post-procedure echocardiogram showed EF 60-65% and normal functioning valve. Recent echocardiogram on 11/13/2022 showed EF 60-65%, 26mm Medtronic TAVR valve with trivial AI and no valvular leak. -Continue current management.        Dispo: Follow-up in 3 months  Signed, Azalee Course, [place]

## 2023-04-06 NOTE — Progress Notes (Signed)
 SATURATION QUALIFICATIONS: (This note is used to comply with regulatory documentation for home oxygen)  Patient Saturations on Room Air at Rest = 91%  Patient Saturations on Room Air while Ambulating = 82%  Patient Saturations on 2 Liters of oxygen while Ambulating = 92%  Please briefly explain why patient needs home oxygen:   Patient's oxygen saturation on room air dropped to 82% while ambulating.

## 2023-04-07 LAB — BASIC METABOLIC PANEL WITH GFR
BUN/Creatinine Ratio: 17 (ref 12–28)
BUN: 34 mg/dL — ABNORMAL HIGH (ref 8–27)
CO2: 26 mmol/L (ref 20–29)
Calcium: 9.5 mg/dL (ref 8.7–10.3)
Chloride: 100 mmol/L (ref 96–106)
Creatinine, Ser: 2.01 mg/dL — ABNORMAL HIGH (ref 0.57–1.00)
Glucose: 104 mg/dL — ABNORMAL HIGH (ref 70–99)
Potassium: 5.1 mmol/L (ref 3.5–5.2)
Sodium: 140 mmol/L (ref 134–144)
eGFR: 24 mL/min/1.73 — ABNORMAL LOW

## 2023-04-09 NOTE — Telephone Encounter (Signed)
 LMOVM requesting call back from [employer]. Direct number provided.  Number that pt provided [phone]) is the [facility] office fax number.

## 2023-04-12 NOTE — Telephone Encounter (Signed)
 LMVOM requesting call back from patient. Direct number provided.   Will provide update about status of portable oxygen concentrator.

## 2023-04-12 NOTE — Telephone Encounter (Signed)
 Spoke with Helmut Muster at [employer]. She was able to find patient's information in their system. She recommended faxing portable oxygen concentrator (POC) order to [phone].  Per Helmut Muster, POC delivers "pulse dose" O2, rather than continuous flow O2. She states that patients sometimes need this titrated by their pulmonologist in the office. Will confirm next steps with provider.

## 2023-04-13 ENCOUNTER — Other Ambulatory Visit: Payer: Self-pay

## 2023-04-13 DIAGNOSIS — Z79899 Other long term (current) drug therapy: Secondary | ICD-10-CM

## 2023-04-18 ENCOUNTER — Telehealth: Payer: Self-pay

## 2023-04-18 NOTE — Patient Outreach (Addendum)
 Care Coordination   04/18/2023 Name: Mary Mccarthy MRN: [id number] DOB: Apr 13, 1940   Care Coordination Outreach Attempts:  An unsuccessful outreach was attempted for an appointment today.  Follow Up Plan:  Additional outreach attempts will be made to offer the patient complex care management information and services.   Encounter Outcome:  No Answer   Care Coordination Interventions:  No, not indicated    Kathyrn Sheriff, RN, MSN, BSN, CCM [facility], Population Health Case Manager Phone: [phone]   Addendum 04/18/23 12:15 pm: RNCM received message from S. Fye that patient returned call to office stating she gets a lot of spam calls with apology for missing call, and request RNCM call her back today. RNCM placed another unsuccessful call. Additional outreach attempts will be made to reschedule.

## 2023-04-18 NOTE — Telephone Encounter (Signed)
 Discussed with Azalee Course, PA. Per Wynema Birch, plan to order portable oxygen concentrator with flow at 2L and refer to pulmonology for titration and ongoing management. Order faxed to [facility] per pt request. Confirmation received.   Spoke with patient to provide an update. Explained that pulmonology should manage/titrate O2 long-term. Pt is agreeable to referral to [facility]. Referral entered per verbal order from [place], [place]. Advised pt that I will contact [employer] this week to confirm that they received order. Pt verbalized understanding and expressed appreciation for assistance.  Addended to add that [employer] had not yet received order as of 04/19/23, but representative said that faxes may take more than 24 hours to show in their system. Plan to follow-up early next week.

## 2023-04-18 NOTE — Addendum Note (Signed)
 Addended by: Bena Kobel C on: 04/18/2023 12:46 PM   Modules accepted: Orders

## 2023-04-23 NOTE — Telephone Encounter (Signed)
 Spoke with [employer], who confirmed that the order has been received and that their team will reach out to the patient soon.  LMOVM for patient requesting call back. Direct number provided.  Attempted to reach son, Reita Cliche ([place]). No answer, VM full.

## 2023-05-01 DIAGNOSIS — J9610 Chronic respiratory failure, unspecified whether with hypoxia or hypercapnia: Secondary | ICD-10-CM | POA: Diagnosis not present

## 2023-05-07 ENCOUNTER — Other Ambulatory Visit: Payer: Self-pay | Admitting: Internal Medicine

## 2023-05-08 ENCOUNTER — Other Ambulatory Visit: Payer: Self-pay | Admitting: Internal Medicine

## 2023-05-14 ENCOUNTER — Ambulatory Visit

## 2023-05-14 ENCOUNTER — Inpatient Hospital Stay (HOSPITAL_COMMUNITY)
Admission: EM | Admit: 2023-05-14 | Discharge: 2023-06-07 | DRG: 291 | Disposition: A | Discharge status: Home Health | Attending: Internal Medicine | Admitting: Internal Medicine

## 2023-05-14 ENCOUNTER — Emergency Department (HOSPITAL_COMMUNITY)

## 2023-05-14 ENCOUNTER — Encounter (HOSPITAL_COMMUNITY): Payer: Self-pay

## 2023-05-14 ENCOUNTER — Other Ambulatory Visit: Payer: Self-pay

## 2023-05-14 DIAGNOSIS — Z1152 Encounter for screening for COVID-19: Secondary | ICD-10-CM

## 2023-05-14 DIAGNOSIS — E1165 Type 2 diabetes mellitus with hyperglycemia: Secondary | ICD-10-CM | POA: Diagnosis present

## 2023-05-14 DIAGNOSIS — J690 Pneumonitis due to inhalation of food and vomit: Secondary | ICD-10-CM | POA: Diagnosis not present

## 2023-05-14 DIAGNOSIS — N184 Chronic kidney disease, stage 4 (severe): Secondary | ICD-10-CM | POA: Diagnosis present

## 2023-05-14 DIAGNOSIS — E8770 Fluid overload, unspecified: Secondary | ICD-10-CM

## 2023-05-14 DIAGNOSIS — I3139 Other pericardial effusion (noninflammatory): Secondary | ICD-10-CM | POA: Diagnosis present

## 2023-05-14 DIAGNOSIS — I5033 Acute on chronic diastolic (congestive) heart failure: Secondary | ICD-10-CM | POA: Insufficient documentation

## 2023-05-14 DIAGNOSIS — I160 Hypertensive urgency: Secondary | ICD-10-CM | POA: Diagnosis present

## 2023-05-14 DIAGNOSIS — R0902 Hypoxemia: Principal | ICD-10-CM

## 2023-05-14 DIAGNOSIS — I350 Nonrheumatic aortic (valve) stenosis: Secondary | ICD-10-CM | POA: Diagnosis present

## 2023-05-14 DIAGNOSIS — I130 Hypertensive heart and chronic kidney disease with heart failure and stage 1 through stage 4 chronic kidney disease, or unspecified chronic kidney disease: Secondary | ICD-10-CM | POA: Diagnosis not present

## 2023-05-14 DIAGNOSIS — R0602 Shortness of breath: Secondary | ICD-10-CM

## 2023-05-14 DIAGNOSIS — E039 Hypothyroidism, unspecified: Secondary | ICD-10-CM | POA: Diagnosis not present

## 2023-05-14 DIAGNOSIS — J8489 Other specified interstitial pulmonary diseases: Secondary | ICD-10-CM | POA: Diagnosis present

## 2023-05-14 DIAGNOSIS — Z952 Presence of prosthetic heart valve: Secondary | ICD-10-CM | POA: Diagnosis not present

## 2023-05-14 DIAGNOSIS — J479 Bronchiectasis, uncomplicated: Secondary | ICD-10-CM | POA: Diagnosis present

## 2023-05-14 DIAGNOSIS — N179 Acute kidney failure, unspecified: Secondary | ICD-10-CM | POA: Diagnosis not present

## 2023-05-14 DIAGNOSIS — F418 Other specified anxiety disorders: Secondary | ICD-10-CM | POA: Diagnosis not present

## 2023-05-14 DIAGNOSIS — Z888 Allergy status to other drugs, medicaments and biological substances status: Secondary | ICD-10-CM

## 2023-05-14 DIAGNOSIS — E1122 Type 2 diabetes mellitus with diabetic chronic kidney disease: Secondary | ICD-10-CM | POA: Diagnosis not present

## 2023-05-14 DIAGNOSIS — K219 Gastro-esophageal reflux disease without esophagitis: Secondary | ICD-10-CM | POA: Diagnosis present

## 2023-05-14 DIAGNOSIS — M6281 Muscle weakness (generalized): Secondary | ICD-10-CM | POA: Diagnosis present

## 2023-05-14 DIAGNOSIS — M79671 Pain in right foot: Secondary | ICD-10-CM | POA: Diagnosis present

## 2023-05-14 DIAGNOSIS — E1169 Type 2 diabetes mellitus with other specified complication: Secondary | ICD-10-CM | POA: Diagnosis not present

## 2023-05-14 DIAGNOSIS — R7989 Other specified abnormal findings of blood chemistry: Secondary | ICD-10-CM | POA: Diagnosis not present

## 2023-05-14 DIAGNOSIS — Z818 Family history of other mental and behavioral disorders: Secondary | ICD-10-CM

## 2023-05-14 DIAGNOSIS — Z66 Do not resuscitate: Secondary | ICD-10-CM | POA: Diagnosis not present

## 2023-05-14 DIAGNOSIS — J984 Other disorders of lung: Secondary | ICD-10-CM | POA: Diagnosis not present

## 2023-05-14 DIAGNOSIS — D649 Anemia, unspecified: Secondary | ICD-10-CM | POA: Diagnosis present

## 2023-05-14 DIAGNOSIS — T462X1A Poisoning by other antidysrhythmic drugs, accidental (unintentional), initial encounter: Secondary | ICD-10-CM | POA: Diagnosis not present

## 2023-05-14 DIAGNOSIS — I129 Hypertensive chronic kidney disease with stage 1 through stage 4 chronic kidney disease, or unspecified chronic kidney disease: Secondary | ICD-10-CM | POA: Diagnosis not present

## 2023-05-14 DIAGNOSIS — J9621 Acute and chronic respiratory failure with hypoxia: Secondary | ICD-10-CM | POA: Diagnosis not present

## 2023-05-14 DIAGNOSIS — E871 Hypo-osmolality and hyponatremia: Secondary | ICD-10-CM | POA: Diagnosis not present

## 2023-05-14 DIAGNOSIS — I517 Cardiomegaly: Secondary | ICD-10-CM | POA: Diagnosis not present

## 2023-05-14 DIAGNOSIS — Z953 Presence of xenogenic heart valve: Secondary | ICD-10-CM

## 2023-05-14 DIAGNOSIS — F32A Depression, unspecified: Secondary | ICD-10-CM | POA: Diagnosis present

## 2023-05-14 DIAGNOSIS — I2510 Atherosclerotic heart disease of native coronary artery without angina pectoris: Secondary | ICD-10-CM | POA: Diagnosis present

## 2023-05-14 DIAGNOSIS — R059 Cough, unspecified: Secondary | ICD-10-CM | POA: Diagnosis not present

## 2023-05-14 DIAGNOSIS — R0603 Acute respiratory distress: Secondary | ICD-10-CM | POA: Diagnosis not present

## 2023-05-14 DIAGNOSIS — Z7722 Contact with and (suspected) exposure to environmental tobacco smoke (acute) (chronic): Secondary | ICD-10-CM | POA: Diagnosis present

## 2023-05-14 DIAGNOSIS — I272 Pulmonary hypertension, unspecified: Secondary | ICD-10-CM | POA: Diagnosis not present

## 2023-05-14 DIAGNOSIS — I4819 Other persistent atrial fibrillation: Secondary | ICD-10-CM | POA: Diagnosis not present

## 2023-05-14 DIAGNOSIS — J4489 Other specified chronic obstructive pulmonary disease: Secondary | ICD-10-CM | POA: Diagnosis present

## 2023-05-14 DIAGNOSIS — T462X5A Adverse effect of other antidysrhythmic drugs, initial encounter: Secondary | ICD-10-CM | POA: Diagnosis not present

## 2023-05-14 DIAGNOSIS — I4892 Unspecified atrial flutter: Secondary | ICD-10-CM | POA: Diagnosis present

## 2023-05-14 DIAGNOSIS — J069 Acute upper respiratory infection, unspecified: Secondary | ICD-10-CM | POA: Diagnosis present

## 2023-05-14 DIAGNOSIS — D631 Anemia in chronic kidney disease: Secondary | ICD-10-CM | POA: Diagnosis not present

## 2023-05-14 DIAGNOSIS — I959 Hypotension, unspecified: Secondary | ICD-10-CM | POA: Diagnosis not present

## 2023-05-14 DIAGNOSIS — M810 Age-related osteoporosis without current pathological fracture: Secondary | ICD-10-CM | POA: Diagnosis present

## 2023-05-14 DIAGNOSIS — J441 Chronic obstructive pulmonary disease with (acute) exacerbation: Secondary | ICD-10-CM | POA: Diagnosis not present

## 2023-05-14 DIAGNOSIS — Z7189 Other specified counseling: Secondary | ICD-10-CM | POA: Diagnosis not present

## 2023-05-14 DIAGNOSIS — T380X5A Adverse effect of glucocorticoids and synthetic analogues, initial encounter: Secondary | ICD-10-CM | POA: Diagnosis present

## 2023-05-14 DIAGNOSIS — I050 Rheumatic mitral stenosis: Secondary | ICD-10-CM | POA: Diagnosis present

## 2023-05-14 DIAGNOSIS — Z794 Long term (current) use of insulin: Secondary | ICD-10-CM

## 2023-05-14 DIAGNOSIS — Z8711 Personal history of peptic ulcer disease: Secondary | ICD-10-CM

## 2023-05-14 DIAGNOSIS — E785 Hyperlipidemia, unspecified: Secondary | ICD-10-CM | POA: Diagnosis present

## 2023-05-14 DIAGNOSIS — I2489 Other forms of acute ischemic heart disease: Secondary | ICD-10-CM | POA: Diagnosis not present

## 2023-05-14 DIAGNOSIS — Z79899 Other long term (current) drug therapy: Secondary | ICD-10-CM

## 2023-05-14 DIAGNOSIS — Z79890 Hormone replacement therapy (postmenopausal): Secondary | ICD-10-CM

## 2023-05-14 DIAGNOSIS — Z515 Encounter for palliative care: Secondary | ICD-10-CM

## 2023-05-14 DIAGNOSIS — J9610 Chronic respiratory failure, unspecified whether with hypoxia or hypercapnia: Secondary | ICD-10-CM | POA: Diagnosis not present

## 2023-05-14 DIAGNOSIS — Z87440 Personal history of urinary (tract) infections: Secondary | ICD-10-CM

## 2023-05-14 DIAGNOSIS — K922 Gastrointestinal hemorrhage, unspecified: Secondary | ICD-10-CM | POA: Diagnosis not present

## 2023-05-14 DIAGNOSIS — R54 Age-related physical debility: Secondary | ICD-10-CM | POA: Diagnosis present

## 2023-05-14 DIAGNOSIS — Z823 Family history of stroke: Secondary | ICD-10-CM

## 2023-05-14 DIAGNOSIS — Z8249 Family history of ischemic heart disease and other diseases of the circulatory system: Secondary | ICD-10-CM

## 2023-05-14 DIAGNOSIS — I480 Paroxysmal atrial fibrillation: Secondary | ICD-10-CM | POA: Diagnosis present

## 2023-05-14 DIAGNOSIS — E872 Acidosis, unspecified: Secondary | ICD-10-CM | POA: Diagnosis not present

## 2023-05-14 DIAGNOSIS — Z8673 Personal history of transient ischemic attack (TIA), and cerebral infarction without residual deficits: Secondary | ICD-10-CM

## 2023-05-14 DIAGNOSIS — Z886 Allergy status to analgesic agent status: Secondary | ICD-10-CM

## 2023-05-14 DIAGNOSIS — J9620 Acute and chronic respiratory failure, unspecified whether with hypoxia or hypercapnia: Secondary | ICD-10-CM | POA: Diagnosis not present

## 2023-05-14 DIAGNOSIS — Z860100 Personal history of colon polyps, unspecified: Secondary | ICD-10-CM

## 2023-05-14 DIAGNOSIS — Z833 Family history of diabetes mellitus: Secondary | ICD-10-CM

## 2023-05-14 DIAGNOSIS — D509 Iron deficiency anemia, unspecified: Secondary | ICD-10-CM | POA: Diagnosis present

## 2023-05-14 DIAGNOSIS — Z7984 Long term (current) use of oral hypoglycemic drugs: Secondary | ICD-10-CM

## 2023-05-14 DIAGNOSIS — J9601 Acute respiratory failure with hypoxia: Secondary | ICD-10-CM | POA: Diagnosis not present

## 2023-05-14 DIAGNOSIS — E119 Type 2 diabetes mellitus without complications: Secondary | ICD-10-CM | POA: Diagnosis not present

## 2023-05-14 DIAGNOSIS — R197 Diarrhea, unspecified: Secondary | ICD-10-CM | POA: Diagnosis not present

## 2023-05-14 DIAGNOSIS — J90 Pleural effusion, not elsewhere classified: Secondary | ICD-10-CM | POA: Diagnosis not present

## 2023-05-14 DIAGNOSIS — R918 Other nonspecific abnormal finding of lung field: Secondary | ICD-10-CM | POA: Diagnosis not present

## 2023-05-14 DIAGNOSIS — E8809 Other disorders of plasma-protein metabolism, not elsewhere classified: Secondary | ICD-10-CM | POA: Diagnosis present

## 2023-05-14 DIAGNOSIS — R0989 Other specified symptoms and signs involving the circulatory and respiratory systems: Secondary | ICD-10-CM | POA: Diagnosis not present

## 2023-05-14 LAB — CBC WITH DIFFERENTIAL/PLATELET
Abs Immature Granulocytes: 0.04 10*3/uL (ref 0.00–0.07)
Basophils Absolute: 0 10*3/uL (ref 0.0–0.1)
Basophils Relative: 0 %
Eosinophils Absolute: 0 10*3/uL (ref 0.0–0.5)
Eosinophils Relative: 0 %
HCT: 27.1 % — ABNORMAL LOW (ref 36.0–46.0)
Hemoglobin: 8.1 g/dL — ABNORMAL LOW (ref 12.0–15.0)
Immature Granulocytes: 1 %
Lymphocytes Relative: 16 %
Lymphs Abs: 0.8 10*3/uL (ref 0.7–4.0)
MCH: 29.1 pg (ref 26.0–34.0)
MCHC: 29.9 g/dL — ABNORMAL LOW (ref 30.0–36.0)
MCV: 97.5 fL (ref 80.0–100.0)
Monocytes Absolute: 0.4 10*3/uL (ref 0.1–1.0)
Monocytes Relative: 8 %
Neutro Abs: 3.9 10*3/uL (ref 1.7–7.7)
Neutrophils Relative %: 75 %
Platelets: 223 10*3/uL (ref 150–400)
RBC: 2.78 MIL/uL — ABNORMAL LOW (ref 3.87–5.11)
RDW: 15.7 % — ABNORMAL HIGH (ref 11.5–15.5)
WBC: 5.2 10*3/uL (ref 4.0–10.5)
nRBC: 0 % (ref 0.0–0.2)

## 2023-05-14 LAB — COMPREHENSIVE METABOLIC PANEL WITH GFR
ALT: 28 U/L (ref 0–44)
AST: 58 U/L — ABNORMAL HIGH (ref 15–41)
Albumin: 3.2 g/dL — ABNORMAL LOW (ref 3.5–5.0)
Alkaline Phosphatase: 48 U/L (ref 38–126)
Anion gap: 10 (ref 5–15)
BUN: 24 mg/dL — ABNORMAL HIGH (ref 8–23)
CO2: 25 mmol/L (ref 22–32)
Calcium: 9.4 mg/dL (ref 8.9–10.3)
Chloride: 100 mmol/L (ref 98–111)
Creatinine, Ser: 1.98 mg/dL — ABNORMAL HIGH (ref 0.44–1.00)
GFR, Estimated: 24 mL/min — ABNORMAL LOW (ref >60)
Glucose, Bld: 109 mg/dL — ABNORMAL HIGH (ref 70–99)
Potassium: 4.1 mmol/L (ref 3.5–5.1)
Sodium: 135 mmol/L (ref 135–145)
Total Bilirubin: 0.9 mg/dL (ref 0.0–1.2)
Total Protein: 7.8 g/dL (ref 6.5–8.1)

## 2023-05-14 LAB — URINALYSIS, W/ REFLEX TO CULTURE (INFECTION SUSPECTED)
Bilirubin Urine: NEGATIVE
Glucose, UA: NEGATIVE mg/dL
Hgb urine dipstick: NEGATIVE
Ketones, ur: NEGATIVE mg/dL
Leukocytes,Ua: NEGATIVE
Nitrite: NEGATIVE
Protein, ur: 30 mg/dL — AB
Specific Gravity, Urine: 1.014 (ref 1.005–1.030)
pH: 5 (ref 5.0–8.0)

## 2023-05-14 LAB — I-STAT CG4 LACTIC ACID, ED
Lactic Acid, Venous: 2 mmol/L (ref 0.5–1.9)
Lactic Acid, Venous: 1.4 mmol/L (ref 0.5–1.9)

## 2023-05-14 LAB — LIPASE, BLOOD: Lipase: 19 U/L (ref 11–51)

## 2023-05-14 LAB — RESP PANEL BY RT-PCR (RSV, FLU A&B, COVID)  RVPGX2
Influenza A by PCR: NEGATIVE
Influenza B by PCR: NEGATIVE
Resp Syncytial Virus by PCR: NEGATIVE
SARS Coronavirus 2 by RT PCR: NEGATIVE

## 2023-05-14 LAB — BRAIN NATRIURETIC PEPTIDE: B Natriuretic Peptide: 977.8 pg/mL — ABNORMAL HIGH (ref 0.0–100.0)

## 2023-05-14 LAB — TSH: TSH: 5.654 u[IU]/mL — ABNORMAL HIGH (ref 0.350–4.500)

## 2023-05-14 LAB — TROPONIN I (HIGH SENSITIVITY)
Troponin I (High Sensitivity): 35 ng/L — ABNORMAL HIGH (ref <18)
Troponin I (High Sensitivity): 31 ng/L — ABNORMAL HIGH (ref <18)

## 2023-05-14 LAB — GLUCOSE, CAPILLARY: Glucose-Capillary: 201 mg/dL — ABNORMAL HIGH (ref 70–99)

## 2023-05-14 MED ORDER — IPRATROPIUM-ALBUTEROL 0.5-2.5 (3) MG/3ML IN SOLN
3.0000 mL | Freq: Once | RESPIRATORY_TRACT | Status: AC
  Administered 2023-05-14: 3 mL via RESPIRATORY_TRACT
  Filled 2023-05-14: qty 3

## 2023-05-14 MED ORDER — PANTOPRAZOLE SODIUM 40 MG PO TBEC
40.0000 mg | DELAYED_RELEASE_TABLET | Freq: Two times a day (BID) | ORAL | Status: DC
  Administered 2023-05-14 – 2023-06-07 (×47): 40 mg via ORAL
  Filled 2023-05-14 (×10): qty 1
  Filled 2023-05-14: qty 2
  Filled 2023-05-14 (×17): qty 1
  Filled 2023-05-14: qty 2
  Filled 2023-05-14 (×2): qty 1
  Filled 2023-05-14: qty 2
  Filled 2023-05-14 (×8): qty 1
  Filled 2023-05-14: qty 2
  Filled 2023-05-14 (×2): qty 1
  Filled 2023-05-14: qty 2
  Filled 2023-05-14 (×3): qty 1

## 2023-05-14 MED ORDER — HYDRALAZINE HCL 20 MG/ML IJ SOLN
10.0000 mg | INTRAMUSCULAR | Status: DC | PRN
  Filled 2023-05-14: qty 1

## 2023-05-14 MED ORDER — METOPROLOL SUCCINATE ER 25 MG PO TB24
12.5000 mg | ORAL_TABLET | Freq: Every day | ORAL | Status: DC
  Administered 2023-05-14 – 2023-06-07 (×23): 12.5 mg via ORAL
  Filled 2023-05-14 (×24): qty 1

## 2023-05-14 MED ORDER — FUROSEMIDE 10 MG/ML IJ SOLN
60.0000 mg | Freq: Two times a day (BID) | INTRAMUSCULAR | Status: DC
  Administered 2023-05-15 – 2023-05-17 (×6): 60 mg via INTRAVENOUS
  Filled 2023-05-14 (×7): qty 6

## 2023-05-14 MED ORDER — IPRATROPIUM-ALBUTEROL 0.5-2.5 (3) MG/3ML IN SOLN
3.0000 mL | Freq: Four times a day (QID) | RESPIRATORY_TRACT | Status: DC | PRN
  Administered 2023-05-14 – 2023-05-16 (×2): 3 mL via RESPIRATORY_TRACT
  Filled 2023-05-14 (×4): qty 3

## 2023-05-14 MED ORDER — MIRTAZAPINE 15 MG PO TABS
15.0000 mg | ORAL_TABLET | Freq: Every day | ORAL | Status: DC
  Administered 2023-05-14 – 2023-06-06 (×24): 15 mg via ORAL
  Filled 2023-05-14 (×24): qty 1

## 2023-05-14 MED ORDER — HYDRALAZINE HCL 25 MG PO TABS
25.0000 mg | ORAL_TABLET | Freq: Three times a day (TID) | ORAL | Status: DC
  Administered 2023-05-14 – 2023-05-19 (×15): 25 mg via ORAL
  Filled 2023-05-14 (×15): qty 1

## 2023-05-14 MED ORDER — METHYLPREDNISOLONE SODIUM SUCC 125 MG IJ SOLR
125.0000 mg | Freq: Once | INTRAMUSCULAR | Status: AC
  Administered 2023-05-14: 125 mg via INTRAVENOUS
  Filled 2023-05-14: qty 2

## 2023-05-14 MED ORDER — ISOSORBIDE MONONITRATE ER 30 MG PO TB24
15.0000 mg | ORAL_TABLET | Freq: Every day | ORAL | Status: DC
  Administered 2023-05-14 – 2023-05-16 (×3): 15 mg via ORAL
  Filled 2023-05-14 (×3): qty 1

## 2023-05-14 MED ORDER — FUROSEMIDE 10 MG/ML IJ SOLN
40.0000 mg | Freq: Once | INTRAMUSCULAR | Status: AC
  Administered 2023-05-14: 40 mg via INTRAVENOUS
  Filled 2023-05-14: qty 4

## 2023-05-14 MED ORDER — ENOXAPARIN SODIUM 30 MG/0.3ML IJ SOSY
30.0000 mg | PREFILLED_SYRINGE | INTRAMUSCULAR | Status: DC
  Administered 2023-05-14 – 2023-06-06 (×24): 30 mg via SUBCUTANEOUS
  Filled 2023-05-14 (×24): qty 0.3

## 2023-05-14 MED ORDER — ONDANSETRON HCL 4 MG/2ML IJ SOLN: 4.0000 mg | Freq: Four times a day (QID) | INTRAMUSCULAR | Status: DC | PRN

## 2023-05-14 MED ORDER — ALPRAZOLAM 0.25 MG PO TABS
0.2500 mg | ORAL_TABLET | Freq: Three times a day (TID) | ORAL | Status: DC | PRN
  Administered 2023-05-19 – 2023-05-24 (×3): 0.25 mg via ORAL
  Filled 2023-05-14 (×3): qty 1

## 2023-05-14 MED ORDER — AMIODARONE HCL 200 MG PO TABS
200.0000 mg | ORAL_TABLET | Freq: Every day | ORAL | Status: DC
  Administered 2023-05-14 – 2023-05-24 (×10): 200 mg via ORAL
  Filled 2023-05-14 (×10): qty 1

## 2023-05-14 MED ORDER — SODIUM CHLORIDE 0.9 % IV SOLN: 250.0000 mL | INTRAVENOUS | Status: AC | PRN

## 2023-05-14 MED ORDER — FUROSEMIDE 10 MG/ML IJ SOLN: 40.0000 mg | Freq: Two times a day (BID) | INTRAMUSCULAR | Status: DC

## 2023-05-14 MED ORDER — SODIUM CHLORIDE 0.9% FLUSH
3.0000 mL | Freq: Two times a day (BID) | INTRAVENOUS | Status: DC
  Administered 2023-05-14 – 2023-06-07 (×48): 3 mL via INTRAVENOUS

## 2023-05-14 MED ORDER — ACETAMINOPHEN 325 MG PO TABS
650.0000 mg | ORAL_TABLET | ORAL | Status: DC | PRN
  Administered 2023-05-15 – 2023-05-26 (×12): 650 mg via ORAL
  Filled 2023-05-14 (×12): qty 2

## 2023-05-14 MED ORDER — ATORVASTATIN CALCIUM 40 MG PO TABS
40.0000 mg | ORAL_TABLET | Freq: Every day | ORAL | Status: DC
  Administered 2023-05-14 – 2023-06-07 (×24): 40 mg via ORAL
  Filled 2023-05-14 (×24): qty 1

## 2023-05-14 MED ORDER — SODIUM CHLORIDE 0.9% FLUSH: 3.0000 mL | INTRAVENOUS | Status: DC | PRN

## 2023-05-14 MED ORDER — FESOTERODINE FUMARATE ER 4 MG PO TB24
4.0000 mg | ORAL_TABLET | Freq: Every day | ORAL | Status: DC
  Administered 2023-05-14 – 2023-06-07 (×24): 4 mg via ORAL
  Filled 2023-05-14 (×26): qty 1

## 2023-05-14 MED ORDER — LEVOTHYROXINE SODIUM 25 MCG PO TABS
25.0000 ug | ORAL_TABLET | Freq: Every day | ORAL | Status: DC
  Administered 2023-05-14 – 2023-06-07 (×24): 25 ug via ORAL
  Filled 2023-05-14 (×24): qty 1

## 2023-05-14 MED ORDER — TRIMETHOBENZAMIDE HCL 100 MG/ML IM SOLN: 200.0000 mg | Freq: Four times a day (QID) | INTRAMUSCULAR | Status: DC | PRN

## 2023-05-14 NOTE — ED Notes (Signed)
 Nt called CCMD to put pt on monitor

## 2023-05-14 NOTE — ED Provider Notes (Signed)
 [place] EMERGENCY DEPARTMENT AT [facility] Provider Note   CSN: [id number] Arrival date & time: 05/14/23  0840     History  Chief Complaint  Patient presents with   Shortness of Breath    Mary Mccarthy is a 83 y.o. female.  The history is provided by the patient and medical records. No language interpreter was used.  Shortness of Breath Severity:  Severe Onset quality:  Gradual Duration:  3 days Timing:  Constant Progression:  Waxing and waning Chronicity:  New Context: URI   Relieved by:  Nothing Worsened by:  Nothing Ineffective treatments:  None tried Associated symptoms: chest pain, cough, sputum production, vomiting and wheezing   Associated symptoms: no abdominal pain, no fever, no headaches, no neck pain and no rash        Home Medications Prior to Admission medications   Medication Sig Start Date End Date Taking? Authorizing Provider  acetaminophen (TYLENOL) 500 MG tablet Take 500 mg by mouth every 6 (six) hours as needed for moderate pain.    [provider]  ALPRAZolam Prudy Feeler) 0.25 MG tablet Take 1 tablet (0.25 mg total) by mouth 3 (three) times daily as needed for anxiety. 03/14/23   Corwin Levins, MD  amiodarone (PACERONE) 200 MG tablet TAKE 1 TABLET BY MOUTH EVERY DAY 02/06/23   Tonny Bollman, MD  atorvastatin (LIPITOR) 40 MG tablet TAKE 1 TABLET BY MOUTH EVERY DAY 09/04/22   [name], MD  blood glucose meter kit and supplies Dispense based on patient and insurance preference. Use up to four times daily as directed. E11.9 04/15/18   Corwin Levins, MD  Blood Glucose Monitoring Suppl (ONE TOUCH ULTRA 2) w/Device KIT Use as directed daily 06/01/15   Corwin Levins, MD  citalopram (CELEXA) 20 MG tablet Take 1 tablet (20 mg total) by mouth daily. Patient taking differently: Take 10 mg by mouth daily. 09/25/22   Corwin Levins, MD  [employer] VITAMIN B12 1000 MCG tablet TAKE 1 TABLET BY MOUTH EVERY DAY 03/26/23   Corwin Levins, MD   furosemide (LASIX) 20 MG tablet 3 tabs by mouth once daily 04/03/23   Corwin Levins, MD  hydrALAZINE (APRESOLINE) 25 MG tablet Take 1 tablet (25 mg total) by mouth every 8 (eight) hours. 04/03/23   Corwin Levins, MD  isosorbide mononitrate (IMDUR) 30 MG 24 hr tablet Take 0.5 tablets (15 mg total) by mouth daily. 04/03/23   Corwin Levins, MD  Lancets [facility] DELICA PLUS [place]) MISC USE AS DIRECTED TEST 1-2 TIMES A DAY 09/05/20   Corwin Levins, MD  levothyroxine (SYNTHROID) 25 MCG tablet TAKE 1 TABLET BY MOUTH EVERY DAY 03/26/23   Corwin Levins, MD  metFORMIN (GLUCOPHAGE) 1000 MG tablet TAKE 1 TABLET BY MOUTH IN THE AM AND 1/2 IN THE PM 03/26/23   Corwin Levins, MD  metoprolol succinate (TOPROL-XL) 25 MG 24 hr tablet TAKE 1/2 TABLET BY MOUTH DAILY 03/26/23   Corwin Levins, MD  mirtazapine (REMERON) 15 MG tablet TAKE 1 TABLET BY MOUTH EVERYDAY AT BEDTIME 10/30/22   Corwin Levins, MD  [facility] ULTRA test strip USE AS INSTRUCTED TEST 1-2 TIMES A DAY 10/12/20   Corwin Levins, MD  pantoprazole (PROTONIX) 40 MG tablet TAKE 1 TABLET BY MOUTH TWICE A DAY 12/07/22   Janetta Hora, PA-C  solifenacin (VESICARE) 5 MG tablet Take 1 tablet (5 mg total) by mouth daily. 03/14/23   Corwin Levins,  MD      Allergies    Ibuprofen    Review of Systems   Review of Systems  Constitutional:  Positive for chills and fatigue. Negative for fever.  HENT:  Negative for congestion.   Respiratory:  Positive for cough, sputum production, chest tightness, shortness of breath and wheezing.   Cardiovascular:  Positive for chest pain and leg swelling. Negative for palpitations.  Gastrointestinal:  Positive for diarrhea, nausea and vomiting. Negative for abdominal pain and constipation.  Genitourinary:  Negative for dysuria.  Musculoskeletal:  Negative for back pain, neck pain and neck stiffness.  Skin:  Negative for rash and wound.  Neurological:  Negative for weakness, light-headedness, numbness and headaches.   Psychiatric/Behavioral:  Negative for agitation and confusion.     Physical Exam Updated Vital Signs BP (!) 181/56   Pulse 64   Temp 98.4 F (36.9 C)   Resp (!) 24   Ht 5\' 1"  (1.549 m)   Wt 84.8 kg   SpO2 98%   BMI 35.33 kg/m  Physical Exam Vitals and nursing note reviewed.  Constitutional:      General: She is not in acute distress.    Appearance: She is well-developed. She is not ill-appearing, toxic-appearing or diaphoretic.  HENT:     Head: Normocephalic and atraumatic.  Eyes:     Conjunctiva/sclera: Conjunctivae normal.     Pupils: Pupils are equal, round, and reactive to light.  Cardiovascular:     Rate and Rhythm: Normal rate and regular rhythm.     Heart sounds: No murmur heard. Pulmonary:     Effort: Pulmonary effort is normal. No tachypnea or respiratory distress.     Breath sounds: Wheezing, rhonchi and rales present.  Chest:     Chest wall: No tenderness.  Abdominal:     Palpations: Abdomen is soft.     Tenderness: There is no abdominal tenderness.  Musculoskeletal:        General: No swelling.     Cervical back: Neck supple.     Right lower leg: No tenderness. Edema present.     Left lower leg: No tenderness. Edema present.  Skin:    General: Skin is warm and dry.     Capillary Refill: Capillary refill takes less than 2 seconds.     Coloration: Skin is not pale.     Findings: No erythema.  Neurological:     General: No focal deficit present.     Mental Status: She is alert.  Psychiatric:        Mood and Affect: Mood normal.     ED Results / Procedures / Treatments   Labs (all labs ordered are listed, but only abnormal results are displayed) Labs Reviewed  CBC WITH DIFFERENTIAL/PLATELET - Abnormal; Notable for the following components:      Result Value   RBC 2.78 (*)    Hemoglobin 8.1 (*)    HCT 27.1 (*)    MCHC 29.9 (*)    RDW 15.7 (*)    All other components within normal limits  COMPREHENSIVE METABOLIC PANEL WITH GFR - Abnormal;  Notable for the following components:   Glucose, Bld 109 (*)    BUN 24 (*)    Creatinine, Ser 1.98 (*)    Albumin 3.2 (*)    AST 58 (*)    GFR, Estimated 24 (*)    All other components within normal limits  URINALYSIS, W/ REFLEX TO CULTURE (INFECTION SUSPECTED) - Abnormal; Notable for the following components:  APPearance TURBID (*)    Protein, ur 30 (*)    Bacteria, UA RARE (*)    All other components within normal limits  BRAIN NATRIURETIC PEPTIDE - Abnormal; Notable for the following components:   B Natriuretic Peptide 977.8 (*)    All other components within normal limits  I-STAT CG4 LACTIC ACID, ED - Abnormal; Notable for the following components:   Lactic Acid, Venous 2.0 (*)    All other components within normal limits  TROPONIN I (HIGH SENSITIVITY) - Abnormal; Notable for the following components:   Troponin I (High Sensitivity) 35 (*)    All other components within normal limits  TROPONIN I (HIGH SENSITIVITY) - Abnormal; Notable for the following components:   Troponin I (High Sensitivity) 31 (*)    All other components within normal limits  RESP PANEL BY RT-PCR (RSV, FLU A&B, COVID)  RVPGX2  LIPASE, BLOOD  TSH  I-STAT CG4 LACTIC ACID, ED    EKG EKG Interpretation Date/Time:  Monday May 14 2023 10:43:11 EDT Ventricular Rate:  62 PR Interval:  158 QRS Duration:  122 QT Interval:  486 QTC Calculation: 494 R Axis:   116  Text Interpretation: Sinus rhythm Nonspecific intraventricular conduction delay Minimal ST elevation, lateral leads when compared to prior, similar appearance. No STEMI Confirmed by Theda Belfast ([id number]) on 05/14/2023 10:48:19 AM  Radiology DG Chest Portable 1 View Result Date: 05/14/2023 CLINICAL DATA:  Shortness of breath.  Cough. EXAM: PORTABLE CHEST 1 VIEW COMPARISON:  03/14/2023. FINDINGS: Low lung volume. Mild-to-moderate pulmonary vascular congestion with hilar predominance. Bilateral lung fields are otherwise clear. No dense consolidation  or lung collapse. Bilateral costophrenic angles are clear. Moderately enlarged cardio-mediastinal silhouette, which is likely accentuated by low lung volume and AP technique. No acute osseous abnormalities. A well-defined ossific density along the superior and lateral aspect of the right humeral head is nonspecific but is most commonly due to calcific tendinopathy. The soft tissues are within normal limits. IMPRESSION: Mild-to-moderate pulmonary vascular congestion with hilar predominance. No dense consolidation or lung collapse. Electronically Signed   By: Jules Schick M.D.   On: 05/14/2023 10:03    Procedures Procedures    Medications Ordered in ED Medications  sodium chloride flush (NS) 0.9 % injection 3 mL (3 mLs Intravenous Given 05/14/23 1455)  sodium chloride flush (NS) 0.9 % injection 3 mL (has no administration in time range)  0.9 %  sodium chloride infusion (has no administration in time range)  acetaminophen (TYLENOL) tablet 650 mg (has no administration in time range)  ondansetron (ZOFRAN) injection 4 mg (has no administration in time range)  enoxaparin (LOVENOX) injection 30 mg (30 mg Subcutaneous Given 05/14/23 1452)  furosemide (LASIX) injection 40 mg (has no administration in time range)  ipratropium-albuterol (DUONEB) 0.5-2.5 (3) MG/3ML nebulizer solution 3 mL (3 mLs Nebulization Given 05/14/23 1452)  methylPREDNISolone sodium succinate (SOLU-MEDROL) 125 mg/2 mL injection 125 mg (125 mg Intravenous Given 05/14/23 1031)  ipratropium-albuterol (DUONEB) 0.5-2.5 (3) MG/3ML nebulizer solution 3 mL (3 mLs Nebulization Given 05/14/23 1032)  furosemide (LASIX) injection 40 mg (40 mg Intravenous Given 05/14/23 1452)    ED Course/ Medical Decision Making/ A&P                                 Medical Decision Making Amount and/or Complexity of Data Reviewed Labs: ordered. Radiology: ordered.  Risk Prescription drug management. Decision regarding hospitalization.    Mary [place]grove  Mccarthy is a 83 y.o. female with a past medical history significant for hypertension, hyperlipidemia, diabetes, CKD, CAD, previous TAVR, electrolyte disturbances, diabetes, and thyroid disease who presents with peripheral edema, shortness of breath, and left chest discomfort.  According to patient, she has been having worsening shortness of breath and fatigue especially with exertion.  She reports she was having pain in her left axilla and chest area that was just for yesterday for about 30 minutes.  It then resolved.  No chest pain now but feels chest tightness and shortness of breath.  She has had cough.  She is having edema in her legs and thinks despite her diuretic use her edema is worsening.  She takes 2 L nasal cannula normally but on arrival sats were in the 80s on 2 L, increased to 4 she is now in the 90s.  She reportedly was hypoxic in the 60s with EMS initially.  On exam, patient does have wheezing, rales, and rhonchi.  Legs have some edema bilaterally but chest and abdomen are nontender.  She does have a slight murmur.  Clinically I am concerned about fluid overload as well as possible infection or cardiac disease.  Will get screening labs and workup.  Patient's workup did return.  Her BNP is much more elevated at 977, suspect fluid overload.  Troponin cell elevated at 35.  Metabolic panel  shows similar creatinine to prior.  [place]i is slightly worse than prior but no reported bleeding.  No elevation in white blood cell count.  Still waiting on her COVID swab and her delta troponin however was refused oxygen requirement and fluid overload despite taking her diuretics anticipate she will need admission.  X-ray showed pulmonary vascular congestion and edema without consolidation or pneumonia.  Anticipate admission for fluid management and diuresis.  1:49 PM Workup continues to return.  X-ray does not show pneumonia but does show fluid overload.  BNP is much higher at 977 today.  Urinalysis did  not show UTI and kidney function similar to prior.  Troponin is improved from what has been in the past at 35 and lactic acid has down trended here.  Suspect this is fluid overload primarily possible in the setting of viral illness.  COVID/flu/RSV test is still in process.  Due to this increased oxygen requirement up to 5 L nasal cannula, she will need admission for safe diuresis, renal monitoring, and oxygen support.  Will call for admission for CHF exacerbation and fluid overload.        Final Clinical Impression(s) / ED Diagnoses Final diagnoses:  Hypoxia  SOB (shortness of breath)  Hypervolemia, unspecified hypervolemia type     Clinical Impression: 1. Hypoxia   2. SOB (shortness of breath)   3. Hypervolemia, unspecified hypervolemia type     Disposition: Admit  This note was prepared with assistance of Dragon voice recognition software. Occasional wrong-word or sound-a-like substitutions may have occurred due to the inherent limitations of voice recognition software.     Winferd Wease, Canary Brim, MD 05/14/23 1535

## 2023-05-14 NOTE — ED Triage Notes (Signed)
 Pt states that she has been having worsening SOB and leg swelling over the last several days. Pt also states that she had L midaxillary pain yesterday, but has resolved. Pt wears 2 lpm O2 via [place] at baseline, however during triage she did not have oxygen on and has SPO2 at 75%. Improved with [place] placed.

## 2023-05-14 NOTE — H&P (Signed)
 History and Physical    Patient: Mary Mccarthy:[id number] DOB: 06/15/1940 DOA: 05/14/2023 DOS: the patient was seen and examined on 05/14/2023 PCP: Corwin Levins, MD  Patient coming from: Home  Chief Complaint:  Chief Complaint  Patient presents with   Shortness of Breath   HPI: Mary Mccarthy is a 83 y.o. female with medical history significant of hypertension, hyperlipidemia, respiratory failure on 2 L, PAF not on anticoagulation due to history of recurrent GI bleed, severe aortic stenosis s/p TAVR complicated by transient heart block, diabetes mellitus type II, CAD, hypothyroidism and GERD presents with complaints of shortness of breath and swelling over the last 2 weeks.  She has been experiencing intermittent lower extremity swelling since her last hospitalization for congestive heart failure back in January of  this year, but  notable worsening over the past two weeks. She has been monitoring her weight daily, and had noted a recent gradual increase.  In response to her symptoms, she has been taking extra doses of Lasix, specifically 80 mg once daily for the past week, but reports no significant improvement.  Patient notes associated symptoms of a productive cough and intermittent wheezing.  No fever, black/dark stools, or dysuria symptoms. She reports having pain on her right flank secondary to coughing.  In the emergency department patient was noted to be afebrile with respirations elevated up to 36, blood pressure was elevated up to 196/70, and O2 saturations noted to be as low as 80% with improvement on 4 L of nasal cannula oxygen.  Labs significant for 1, 8, AST 58, BNP 177.8, high-sensitivity troponin 35, and lactic acid 2.  Chest x-ray showed mild to moderate pulmonary vascular congestion with bilateral predominance.  Patient given Lasix 40 IV x 1 dose and solumedrol 125 mg IV.   Review of Systems: As mentioned in the history of present illness. All other  systems reviewed and are negative. Past Medical History:  Diagnosis Date   ALLERGIC RHINITIS 06/24/2007   Anemia    AVM (arteriovenous malformation) of colon    Blood transfusion without reported diagnosis 05/2015   COLONIC POLYPS, HX OF 06/24/2007   Coronary artery disease, non-occlusive 05/2015   Trop + w/ Acute Anemia =>CATH: small RI - Ostial 60%, ostial RCA 30% and dLAD 40-50%;; 9/'21: Cor Ca Score 624. Mild (25-49%) prox RCA & LAD,; Moderate (50-69%) Ostial Small RI & prox LCx.     DEPRESSION 10/08/2006   DIABETES MELLITUS, TYPE II 10/05/2006   GERD 06/24/2007   History of CVA (cerebrovascular accident) 08/10/2020   08/2020- found to be in afib with RVR, started on Eliquis (shower of emboli from A. fib as complication of severe AS)   HYPERLIPIDEMIA 10/08/2006   HYPERTENSION 10/08/2006   INSOMNIA 09/24/2008   Left knee DJD    Nonsustained paroxysmal ventricular tachycardia (HCC) 10/2018   Post-TAVR Zio Patch: Frequent (206) runs of NSVT - Fastest 5 beats 239 bpm. Longest 9.3 Sec avg 135 bpm. (? possibly Afib w/ aberrancy)   Osteoporosis 03/10/2016   PAF (paroxysmal atrial fibrillation) (HCC)    PAF with RVR: CHA2DS2-VASc 9.  On Eliquis and amiodarone. 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 Clifton James & Dr. Laneta Simmers   Severe aortic stenosis 08/2020   Progression from Mod-Severe AS to Severe AS - noted on Echo 08/2020 -> (mean gradient progressed from  34 to 42 mmHg) this was in setting of new onset A. fib RVR, acute diastolic HF and shower of emboli CVA. ->  Referred for TAVR, completed 10/26/2020   Past Surgical History:  Procedure Laterality Date   APPENDECTOMY     BIOPSY  10/21/2020   Procedure: BIOPSY;  Surgeon: Imogene Burn, MD;  Location: [facility] ENDOSCOPY;  Service: Gastroenterology;;   BREAST BIOPSY     CARDIAC CATHETERIZATION N/A  05/21/2015   Procedure: Left Heart Cath and Coronary Angiography;  Surgeon: [name], MD;  Location: [facility] INVASIVE CV LAB;  Service: Cardiovascular;: Ost RI 60%, Ost RCA 30%, dLAD tapers to small vessel w/ 40-50%. Mildly elevated LVEDP. Normal LV Fxn.   COLONOSCOPY N/A 05/20/2015   Procedure: COLONOSCOPY;  Surgeon: Ruffin Frederick, MD;  Location: [facility] ENDOSCOPY;  Service: Gastroenterology;  Laterality: N/A;   CORONARY CA2+ SCORE / CARDIAC CT ANGIOGRAM  10/09/2019   Calcium score 624.  82nd percentile. Dominant RCA: Mild (25-49%) proximal stenosis-distal bifurcation into PDA and PAV--< RPL branches.  LAD (1 major mid vessel diagonal) diffuse calcified plaque, mild proximal stenosis with minimal distal stenosis.  Small RI moderate ostial disease.  LCx-moderate mixed (50-69%) proximal stenosis.  Small dOM1 disease.  Trileaflet AoV, annular Ca2+ - probable AS   ESOPHAGOGASTRODUODENOSCOPY N/A 05/20/2015   Procedure: ESOPHAGOGASTRODUODENOSCOPY (EGD);  Surgeon: Ruffin Frederick, MD;  Location: [facility] ENDOSCOPY;  Service: Gastroenterology;  Laterality: N/A;   ESOPHAGOGASTRODUODENOSCOPY (EGD) WITH PROPOFOL N/A 10/21/2020   Procedure: ESOPHAGOGASTRODUODENOSCOPY (EGD) WITH PROPOFOL;  Surgeon: Imogene Burn, MD;  Location: [facility] ENDOSCOPY;  Service: Gastroenterology;  Laterality: N/A;   INTRAOPERATIVE TRANSTHORACIC ECHOCARDIOGRAM N/A 10/26/2020   Procedure: INTRAOPERATIVE TRANSTHORACIC ECHOCARDIOGRAM;  Surgeon: Clifton James; Location: [facility] OR; Pre-TAVR well-visualized calcified AoV mean Grad 37 mmHg, AVA 0.72 cm => Post TAVR well-positioned supra-annular 26 mm Medtronic Evolut Pro valve placed with no PVL.  Mean gradient 14 mmHg.  AVR 1.58 cm.  Normal flow to RCA and LM-LAD.  ; EF 60 to 65%.  Degenerative severe MAC w/ mild MR.   RIGHT/LEFT HEART CATH AND CORONARY ANGIOGRAPHY N/A 09/29/2020   Procedure: RIGHT/LEFT HEART CATH AND CORONARY ANGIOGRAPHY;  Surgeon: Kathleene Hazel, MD;  Location: [facility]  INVASIVE CV LAB;  Service: Cardiovascular; pre-TAVR:  pRCA 20%, m-dRCA 30%. D LM-pLAD 20%. RI 30%, mLAD 20%.  Severe AS with mean gradient measured at 52.8 mmHg.   TONSILLECTOMY     TRANSCATHETER AORTIC VALVE REPLACEMENT, TRANSFEMORAL N/A 10/26/2020   Procedure: TRANSCATHETER AORTIC VALVE REPLACEMENT, TRANSFEMORAL;  Surgeon: Kathleene Hazel, MD;  Location: [facility] OR;  Service: Open Heart Surgery;; Medtronic Evolut-Pro + (size 26 mm, model # EVPROPLUS -26US, serial # [id number]); transient high-grade AV block noted so PPM left in place.   TRANSTHORACIC ECHOCARDIOGRAM  03/17/2019   a) 03/2019: EF 60 to 65%.  Moderate LVH.  GRII DD.  Mod-Severe AS (m grad 36 mmHg, peak 59 mmHg); b) 09/2019: EF 65-70%, No RWMA. Gr1 DD/hi LAP, Mild hi PAP. Mod LA Dil. MOD AS (mean Grad 34.5 mmHg).  STABLE   TRANSTHORACIC ECHOCARDIOGRAM  08/27/2020   Admitted for CVA/Afib RVR & CHF (Progression to SEVERE SYMPTOMATIC AS):  Severe Calcific Aortic Stenosis (VTI AVA estimated 0.86 cm, mean gradient 42 mmHg, V-max 4.36 m/s).  EF 55 to 60%.  Severe concentric LVH.  Unable to determine diastolic parameters.  Moderately elevated PAP.  Mild LA dilation.  Mild circumferential pericardial effusion.  Trivial MR.  Severe MAC.   TRANSTHORACIC ECHOCARDIOGRAM  11/25/2020   1  Month s/p TAVR: EF 55 to 60%.  No R WMA.  GR 1 DD.  Elevated LVEDP.  Normal RV size with mildly elevated RVP estimated 44 mmHg.  Oscillating density in the RV suspect calcified chordal apparatus versus calcified thrombus.  Moderate LA dilation.  Normal IVC/RA P => Well-positioned 26 mm Medtronic Evolut Pro THVwith mean AOV gradient 11 mmHg.  Trivial PVL   TRANSTHORACIC ECHOCARDIOGRAM  11/02/2020   a) Day 1 Post-op TAVR 10/27/20:  Well-Positioned Supra Annular Medtronic Evolut Pro THP.  Mean gradient 12 mmHg, peak 24 mmHg.  AVA 1.8 cm.  Trivial PVL.  EF 60 to 65%.  Normal LV function.  Mild LVH.  Severe LA dilation.  Mild RA dilation.  Severe MAC;; b) 11/02/20: Normal  structure and function of the aortic valve prosthesis.  Mean gradient 9 mmHg (otherwise stable)   TUBAL LIGATION     Social History:  reports that she has never smoked. She has never used smokeless tobacco. She reports that she does not drink alcohol and does not use drugs.  Allergies  Allergen Reactions   Ibuprofen Other (See Comments)    Bleeding events    Family History  Problem Relation Age of Onset   Alcohol abuse Other    Arthritis Other        DJD   Hyperlipidemia Other    Heart disease Other    Stroke Other    Hypertension Other    Depression Other    Diabetes Other    Cancer Mother        ENT cancer   CAD Sister        Several MI & PPM; long term smoker, EtOH   Heart attack Sister 37       several   Arrhythmia Sister        s/p PPM (Dr. Royann Shivers)    Prior to Admission medications   Medication Sig Start Date End Date Taking? Authorizing Provider  acetaminophen (TYLENOL) 500 MG tablet Take 500 mg by mouth every 6 (six) hours as needed for moderate pain.    [provider]  ALPRAZolam Prudy Feeler) 0.25 MG tablet Take 1 tablet (0.25 mg total) by mouth 3 (three) times daily as needed for anxiety. 03/14/23   Corwin Levins, MD  amiodarone (PACERONE) 200 MG tablet TAKE 1 TABLET BY MOUTH EVERY DAY 02/06/23   Tonny Bollman, MD  atorvastatin (LIPITOR) 40 MG tablet TAKE 1 TABLET BY MOUTH EVERY DAY 09/04/22   [name], MD  blood glucose meter kit and supplies Dispense based on patient and insurance preference. Use up to four times daily as directed. E11.9 04/15/18   Corwin Levins, MD  Blood Glucose Monitoring Suppl (ONE TOUCH ULTRA 2) w/Device KIT Use as directed daily 06/01/15   Corwin Levins, MD  citalopram (CELEXA) 20 MG tablet Take 1 tablet (20 mg total) by mouth daily. Patient taking differently: Take 10 mg by mouth daily. 09/25/22   Corwin Levins, MD  [employer] VITAMIN B12 1000 MCG tablet TAKE 1 TABLET BY MOUTH EVERY DAY 03/26/23   Corwin Levins, MD  furosemide (LASIX) 20  MG tablet 3 tabs by mouth once daily 04/03/23   Corwin Levins, MD  hydrALAZINE (APRESOLINE) 25 MG tablet Take 1 tablet (25 mg total) by mouth every 8 (eight) hours. 04/03/23   Corwin Levins, MD  isosorbide mononitrate (IMDUR) 30 MG 24 hr tablet Take 0.5 tablets (15 mg total) by mouth daily. 04/03/23   Jonny Ruiz,  Len Blalock, MD  Lancets [facility] DELICA PLUS [place]) MISC USE AS DIRECTED TEST 1-2 TIMES A DAY 09/05/20   Corwin Levins, MD  levothyroxine (SYNTHROID) 25 MCG tablet TAKE 1 TABLET BY MOUTH EVERY DAY 03/26/23   Corwin Levins, MD  metFORMIN (GLUCOPHAGE) 1000 MG tablet TAKE 1 TABLET BY MOUTH IN THE AM AND 1/2 IN THE PM 03/26/23   Corwin Levins, MD  metoprolol succinate (TOPROL-XL) 25 MG 24 hr tablet TAKE 1/2 TABLET BY MOUTH DAILY 03/26/23   Corwin Levins, MD  mirtazapine (REMERON) 15 MG tablet TAKE 1 TABLET BY MOUTH EVERYDAY AT BEDTIME 10/30/22   Corwin Levins, MD  [facility] ULTRA test strip USE AS INSTRUCTED TEST 1-2 TIMES A DAY 10/12/20   Corwin Levins, MD  pantoprazole (PROTONIX) 40 MG tablet TAKE 1 TABLET BY MOUTH TWICE A DAY 12/07/22   Janetta Hora, PA-C  solifenacin (VESICARE) 5 MG tablet Take 1 tablet (5 mg total) by mouth daily. 03/14/23   Corwin Levins, MD    Physical Exam: Vitals:   05/14/23 1045 05/14/23 1200 05/14/23 1238 05/14/23 1315  BP: (!) 196/70 (!) 188/56  (!) 193/61  Pulse: 62 65  68  Resp: (!) 30 (!) 24  17  Temp:   97.7 F (36.5 C)   TempSrc:   Oral   SpO2: (!) 87% 100%  (!) 80%  Weight:      Height:          Constitutional: Female who appears in no acute distress at this time. Eyes: PERRL, lids and conjunctivae normal ENMT: Mucous membranes are moist.  Fair dentition. Neck: normal, supple, JVD present. Respiratory: Mildly decreased overall aeration with some crackles appreciated in the mid to lower lung fields. Cardiovascular: Regular rate and rhythm.  +2 pitting bilateral lower extremity edema. Abdomen: no tenderness, no masses palpated.  Bowel sounds positive.   Musculoskeletal: no clubbing / cyanosis. No joint deformity upper and lower extremities. Good ROM, no contractures. Normal muscle tone.  Skin: no rashes, lesions, ulcers. No induration Neurologic: CN 2-12 grossly intact.  Strength 5/5 in all 4.  Psychiatric: Normal judgment and insight. Alert and oriented x 3. Normal mood.   Data Reviewed:  EKG sinus rhythm at 62 beats per minute with QTc 492.  Reviewed labs, imaging, and pertinent records as documented.  Assessment and Plan:  Acute on chronic respiratory failure with hypoxia secondary to acute on chronic diastolic congestive heart failure Patient presents with acute worsening of shortness of breath and lower extremity swelling despite recently increasing Lasix to 80 mg daily..  Patient normally on 2 L of oxygen, but requiring 4 L to maintain O2 saturations greater than 90%.  Noted to have at least 2+ pitting edema on bilateral lower extremities.  BNP elevated at 977.8.  Last echocardiogram noted EF to be 60 to 65% with grade 2 diastolic dysfunction when checked last on 11/2022.  Patient had been given Lasix 40 mg IV x 1 dose without significant diuresis. -Admit to a cardiac telemetry bed -Continuous pulse oximetry with a scant oxygen to maintain O2 saturation greater than 90% -Heart failure order set utilized -Strict I&O's and daily weights -Check echocardiogram -Given additional 40 mg of Lasix IV x 1 dose, then Lasix 60 mg IV twice daily -Consider formal consult to cardiology in AM.  Elevated troponin Coronary artery disease Acute on chronic.  High-sensitivity troponin 35->31.  Slightly higher than prior, but noted to be trending down. Last heart cath back  in 2022 noted mild nonobstructive coronary artery disease. Suspect secondary to demand in setting of patient being acutely fluid overloaded. -Continue to monitor  Hypertensive urgency Blood pressure elevated initially up to 196/70. -Continue hydralazine, isosorbide, and  metoprolol  Normocytic anemia Acute on chronic.  Hemoglobin noted to be 8.1.  No reports of bleeding.  Possibly secondary to patient being acutely fluid overloaded. -Recheck hemoglobin in a.m.  Chronic kidney disease stage IV Creatinine noted to be 1.98 with a BUN 24.  Baseline creatinine appears to range from 1.5-2. -Continue to monitor kidney function with diuresis  Controlled diabetes mellitus type 2 without long-term use of insulin Last available hemoglobin A1c is noted to be 6.5 when checked on 03/13/2022.  Patient is on metformin in outpatient setting. -Hold metformin.  Continue to monitor and start sliding scale of insulin if blood sugars noted to be elevated greater than 180  Paroxysmal atrial fibrillation Complete heart block Patient appears to be in a sinus rhythm at this time.  CHA2DS2-VASc score equal to at least 8.  Not on anticoagulation due to history of recurrent GI bleeds. -Resume amiodarone  Anxiety with depression -Continue current home medication regimen  Hypothyroidism -Add-on TSH -Continue levothyroxine   S/p TAVR performed back in 10/2020.  DVT prophylaxis: Lovenox Advance Care Planning:   Code Status: Full Code patient confirmed with son present at bedside  Consults: None  Family Communication: Son updated at bedside  Severity of Illness: The appropriate patient status for this patient is INPATIENT. Inpatient status is judged to be reasonable and necessary in order to provide the required intensity of service to ensure the patient's safety. The patient's presenting symptoms, physical exam findings, and initial radiographic and laboratory data in the context of their chronic comorbidities is felt to place them at high risk for further clinical deterioration. Furthermore, it is not anticipated that the patient will be medically stable for discharge from the hospital within 2 midnights of admission.   * I certify that at the point of admission it is my  clinical judgment that the patient will require inpatient hospital care spanning beyond 2 midnights from the point of admission due to high intensity of service, high risk for further deterioration and high frequency of surveillance required.*  Author: [name], MD 05/14/2023 2:23 PM  For on call review [email or web address].

## 2023-05-15 ENCOUNTER — Inpatient Hospital Stay (HOSPITAL_COMMUNITY)

## 2023-05-15 DIAGNOSIS — I4819 Other persistent atrial fibrillation: Secondary | ICD-10-CM | POA: Diagnosis not present

## 2023-05-15 DIAGNOSIS — I5033 Acute on chronic diastolic (congestive) heart failure: Secondary | ICD-10-CM

## 2023-05-15 DIAGNOSIS — E1169 Type 2 diabetes mellitus with other specified complication: Secondary | ICD-10-CM

## 2023-05-15 DIAGNOSIS — R7989 Other specified abnormal findings of blood chemistry: Secondary | ICD-10-CM | POA: Insufficient documentation

## 2023-05-15 DIAGNOSIS — I2510 Atherosclerotic heart disease of native coronary artery without angina pectoris: Secondary | ICD-10-CM | POA: Insufficient documentation

## 2023-05-15 DIAGNOSIS — E785 Hyperlipidemia, unspecified: Secondary | ICD-10-CM

## 2023-05-15 DIAGNOSIS — I129 Hypertensive chronic kidney disease with stage 1 through stage 4 chronic kidney disease, or unspecified chronic kidney disease: Secondary | ICD-10-CM

## 2023-05-15 DIAGNOSIS — E782 Mixed hyperlipidemia: Secondary | ICD-10-CM | POA: Insufficient documentation

## 2023-05-15 DIAGNOSIS — I160 Hypertensive urgency: Secondary | ICD-10-CM | POA: Diagnosis not present

## 2023-05-15 DIAGNOSIS — I480 Paroxysmal atrial fibrillation: Secondary | ICD-10-CM | POA: Diagnosis not present

## 2023-05-15 DIAGNOSIS — I2489 Other forms of acute ischemic heart disease: Secondary | ICD-10-CM | POA: Insufficient documentation

## 2023-05-15 DIAGNOSIS — I130 Hypertensive heart and chronic kidney disease with heart failure and stage 1 through stage 4 chronic kidney disease, or unspecified chronic kidney disease: Secondary | ICD-10-CM | POA: Insufficient documentation

## 2023-05-15 LAB — GLUCOSE, CAPILLARY
Glucose-Capillary: 144 mg/dL — ABNORMAL HIGH (ref 70–99)
Glucose-Capillary: 136 mg/dL — ABNORMAL HIGH (ref 70–99)
Glucose-Capillary: 146 mg/dL — ABNORMAL HIGH (ref 70–99)

## 2023-05-15 LAB — ECHOCARDIOGRAM COMPLETE
AR max vel: 1.93 cm2
AV Area VTI: 1.92 cm2
AV Area mean vel: 1.83 cm2
AV Mean grad: 9 mmHg
AV Peak grad: 14.1 mmHg
Ao pk vel: 1.88 m/s
Area-P 1/2: 4.6 cm2
Calc EF: 61.3 %
Height: 61 in
MV VTI: 1.66 cm2
P 1/2 time: 514 ms
S' Lateral: 2.9 cm
Single Plane A2C EF: 67.7 %
Single Plane A4C EF: 53.9 %
Weight: 3118.19 [oz_av]

## 2023-05-15 LAB — BASIC METABOLIC PANEL WITH GFR
Anion gap: 14 (ref 5–15)
BUN: 30 mg/dL — ABNORMAL HIGH (ref 8–23)
CO2: 23 mmol/L (ref 22–32)
Calcium: 8.9 mg/dL (ref 8.9–10.3)
Chloride: 98 mmol/L (ref 98–111)
Creatinine, Ser: 1.78 mg/dL — ABNORMAL HIGH (ref 0.44–1.00)
GFR, Estimated: 28 mL/min — ABNORMAL LOW (ref >60)
Glucose, Bld: 158 mg/dL — ABNORMAL HIGH (ref 70–99)
Potassium: 3.9 mmol/L (ref 3.5–5.1)
Sodium: 135 mmol/L (ref 135–145)

## 2023-05-15 MED ORDER — INSULIN ASPART 100 UNIT/ML IJ SOLN
0.0000 [IU] | Freq: Every day | INTRAMUSCULAR | Status: DC
  Administered 2023-05-17: 2 [IU] via SUBCUTANEOUS
  Administered 2023-05-18: 3 [IU] via SUBCUTANEOUS
  Administered 2023-05-26: 2 [IU] via SUBCUTANEOUS
  Administered 2023-05-27: 3 [IU] via SUBCUTANEOUS

## 2023-05-15 MED ORDER — INSULIN ASPART 100 UNIT/ML IJ SOLN
0.0000 [IU] | Freq: Three times a day (TID) | INTRAMUSCULAR | Status: DC
Start: 2023-05-16 — End: 2023-05-28
  Administered 2023-05-16 – 2023-05-17 (×3): 1 [IU] via SUBCUTANEOUS
  Administered 2023-05-17: 2 [IU] via SUBCUTANEOUS
  Administered 2023-05-18: 3 [IU] via SUBCUTANEOUS
  Administered 2023-05-18: 2 [IU] via SUBCUTANEOUS
  Administered 2023-05-18: 3 [IU] via SUBCUTANEOUS
  Administered 2023-05-19 (×2): 1 [IU] via SUBCUTANEOUS
  Administered 2023-05-21: 2 [IU] via SUBCUTANEOUS
  Administered 2023-05-22 – 2023-05-25 (×3): 1 [IU] via SUBCUTANEOUS
  Administered 2023-05-26 (×2): 2 [IU] via SUBCUTANEOUS
  Administered 2023-05-26: 1 [IU] via SUBCUTANEOUS
  Administered 2023-05-27 (×2): 2 [IU] via SUBCUTANEOUS
  Administered 2023-05-27 – 2023-05-28 (×2): 3 [IU] via SUBCUTANEOUS
  Administered 2023-05-28: 2 [IU] via SUBCUTANEOUS

## 2023-05-15 NOTE — Progress Notes (Signed)
  Echocardiogram 2D Echocardiogram has been performed.  [name] 05/15/2023, 3:45 PM

## 2023-05-15 NOTE — Assessment & Plan Note (Addendum)
 Hyponatremia. AKI  Today renal function with serum cr at 3,0 with K at 4,4 and serum bicarbonate at 30  Na 132 and Mg 2.4   Continue to hold on furosemide  and follow up renal function in am.  Avoid hypotension and nephrotoxic medications

## 2023-05-15 NOTE — Assessment & Plan Note (Signed)
 Continue with levothyroxine

## 2023-05-15 NOTE — Evaluation (Signed)
 Physical Therapy Evaluation Patient Details Name: Mary Mccarthy MRN: [id number] DOB: 05/03/1940 Today's Date: 05/15/2023  History of Present Illness  Patient is a 83 year old female with acute on chronic respiratory failure with hypoxia secondary to acute on chronic diastolic CHF. History of HTN, hyperlipidemia, respiratory failure on 2 L, PAF, GI bleed, aortic stenosis s/p TAVR,  transient heart block, diabetes mellitus type II, CAD, hypothyroidism, GERD  Clinical Impression  Patient is agreeable to PT evaluation. Her son stays with her at night. She walks limited distance with her rollator in the community, and no device used in the house. She wears oxygen at baseline and needs assistance for oxygen management in the community.  Today the patient required assistance for bed mobility and reports she sleeps in a recliner chair at baseline for respiratory comfort. She needed CGA for standing. Short distance ambulation performed with CGA using rolling walker with Sp02 90% on 3 L02. Mild dyspnea with exertion with activity tolerance limited by fatigue. The patient reports she has has home PT in the past and is not interested in resuming. She is hopeful to go home when ready. PT will continue to follow to maximize independence and decrease caregiver burden.     If plan is discharge home, recommend the following: Assist for transportation;Help with stairs or ramp for entrance;Assistance with cooking/housework   Can travel by private vehicle        Equipment Recommendations None recommended by PT  Recommendations for Other Services       Functional Status Assessment Patient has had a recent decline in their functional status and demonstrates the ability to make significant improvements in function in a reasonable and predictable amount of time.     Precautions / Restrictions Precautions Precautions: Fall Recall of Precautions/Restrictions: Intact Precaution/Restrictions Comments:  monitor 02 Restrictions Weight Bearing Restrictions Per Provider Order: No      Mobility  Bed Mobility Overal bed mobility: Needs Assistance Bed Mobility: Supine to Sit, Sit to Supine     Supine to sit: HOB elevated, Used rails, Mod assist Sit to supine: Min assist, Used rails, HOB elevated   General bed mobility comments: assistance requried for trunk support and scooting towards edge of bed to sit up. assistance for BLE to return to bed. patient does not sleep in a bed at home    Transfers Overall transfer level: Needs assistance Equipment used: Rolling walker (2 wheels) Transfers: Sit to/from Stand Sit to Stand: Contact guard assist           General transfer comment: CGA for safety    Ambulation/Gait Ambulation/Gait assistance: Contact guard assist Gait Distance (Feet): 16 Feet Assistive device: Rolling walker (2 wheels) Gait Pattern/deviations: Step-through pattern Gait velocity: decreased     General Gait Details: very slow with no loss of balance using rolling walker. Sp02 90% on 3 L02 with activity. encourage energy conservation strategies and continued use of rolling walker for safety  Stairs            Wheelchair Mobility     Tilt Bed    Modified Rankin (Stroke Patients Only)       Balance Overall balance assessment: Needs assistance Sitting-balance support: Feet supported Sitting balance-[name]: Good     Standing balance support: Bilateral upper extremity supported Standing balance-[name]: Poor Standing balance comment: heavy reliance on rolling walker for support in standing  Pertinent Vitals/Pain Pain Assessment Pain Assessment: No/denies pain    Home Living Family/patient expects to be discharged to:: Private residence Living Arrangements: Children (son) Available Help at Discharge: Family;Available PRN/intermittently Type of Home: House Home Access: Stairs to enter Entrance  Stairs-Rails: Can reach both Entrance Stairs-Number of Steps: 1   Home Layout: One level Home Equipment: Agricultural consultant (2 wheels);Rollator (4 wheels);Grab bars - tub/shower;Shower seat;Cane - quad Additional Comments: she reports she has had 2 falls in the shower and does not get in the shower frequently. sleeps in a recliner chair. baseline at 2 L02    Prior Function Prior Level of Function : Needs assist             Mobility Comments: uses rollator in the community and needs help for oxygen management when outside the home. inside the home, she is ambulatory without assistance and without device ADLs Comments: son assists with cleaning and provides transportation     Extremity/Trunk Assessment   Upper Extremity Assessment Upper Extremity Assessment: Defer to OT evaluation    Lower Extremity Assessment Lower Extremity Assessment: Generalized weakness       Communication   Communication Communication: No apparent difficulties    Cognition Arousal: Alert Behavior During Therapy: WFL for tasks assessed/performed   PT - Cognitive impairments: No apparent impairments                         Following commands: Intact       Cueing Cueing Techniques: Verbal cues     General Comments      Exercises     Assessment/Plan    PT Assessment Patient needs continued PT services  PT Problem List Decreased strength;Decreased range of motion;Decreased activity tolerance;Decreased balance;Decreased mobility;Cardiopulmonary status limiting activity       PT Treatment Interventions DME instruction;Gait training;Functional mobility training;Stair training;Therapeutic activities;Therapeutic exercise;Balance training;Neuromuscular re-education;Patient/family education;Cognitive remediation    PT Goals (Current goals can be found in the Care Plan section)  Acute Rehab PT Goals Patient Stated Goal: to go home PT Goal Formulation: With patient Time For Goal  Achievement: 05/29/23 Potential to Achieve Goals: Good    Frequency Min 2X/week     Co-evaluation               AM-PAC PT "6 Clicks" Mobility  Outcome Measure Help needed turning from your back to your side while in a flat bed without using bedrails?: A Little Help needed moving from lying on your back to sitting on the side of a flat bed without using bedrails?: A Lot Help needed moving to and from a bed to a chair (including a wheelchair)?: A Little Help needed standing up from a chair using your arms (e.g., wheelchair or bedside chair)?: A Little Help needed to walk in hospital room?: A Little Help needed climbing 3-5 steps with a railing? : A Little 6 Click Score: 17    End of Session Equipment Utilized During Treatment: Oxygen Activity Tolerance: Patient tolerated treatment well Patient left: in bed;with call bell/phone within reach;with bed alarm set Nurse Communication: Mobility status PT Visit Diagnosis: Muscle weakness (generalized) (M62.81);Unsteadiness on feet (R26.81)    Time: 1013-1040 PT Time Calculation (min) (ACUTE ONLY): 27 min   Charges:   PT Evaluation $PT Eval Moderate Complexity: 1 Mod PT Treatments $Therapeutic Activity: 8-22 mins PT General Charges $$ ACUTE PT VISIT: 1 Visit         Donna Bernard, PT, MPT  [employer]  05/15/2023, 11:47 AM

## 2023-05-15 NOTE — Assessment & Plan Note (Signed)
 Continue atorvastatin and blood pressure control

## 2023-05-15 NOTE — Hospital Course (Addendum)
 Mrs. Mary Mccarthy was admitted to the hospital with the working diagnosis of heart failure exacerbation.  83 yo female with the past medical history of hypertension, hyperlipidemia, paroxysmal atrial fibrillation, aortic stenosis sp TAVR, T2DM and obesity who presented with dyspnea and lower extremity edema. Reported 2 weeks of worsening symptoms, along with weight gain. Her dyspnea and edema were persistent despite increasing the dose of her oral furosemide  at home.  At the time of her initial physical examination her 02 saturation was 75% on room air, blood pressure 196/70, HR 62, RR 30.  Lungs with decreased breath sounds and rales bilaterally, up to mid zones, heart with S1 and S2 present and regular, abdomen with no distention and positive bilateral lower extremity, ++ pitting edema.   Na 135, K 4,1 Cl 100, bicarbonate 25 glucose 109, bun 24 cr 1,98  BNP 977 High sensitive troponin 35 and 31  Lactic acid 2,0 and 1,0  Wbc 5,2 hgb 8,1 plt 223  TSH 5,6  Sars covid 19 negative  Influenza negative  RSV negative  Urine analysis SG 1,014, protein 30, negative leukocytes and negative hgb   Chest radiograph with hypoinflation, positive cardiomegaly, bilateral hilar vascular congestion with cephalization of the vasculature.   EKG 62 bpm, right axis deviation, qtc 494, sinus rhythm with poor R R wave progression with no significant ST segment or T wave changes.   Patient was placed on aggressive diuresis.  04/13 developed aspiration pneumonia and was placed on antibiotic therapy.  04/19 placed on steroids for organizing pneumonia and continue IV furosemide  for diuresis. Improving 02 requirements.  04/10 continue to decrease 02 requirements. Holding furosemide  due to rise in serum cr.  04/11 dyspnea is improving along with 02 requirements. CT noted signs of interstitial lung disease, consulting pulmonary.

## 2023-05-15 NOTE — Evaluation (Addendum)
 Occupational Therapy Evaluation Patient Details Name: Mary Mccarthy MRN: [id number] DOB: [birth year] Today's Date: 05/15/2023   History of Present Illness   Patient is a 83 year old female with acute on chronic respiratory failure with hypoxia secondary to acute on chronic diastolic CHF. History of HTN, hyperlipidemia, respiratory failure on 2 L, PAF, GI bleed, aortic stenosis s/p TAVR,  transient heart block, diabetes mellitus type II, CAD, hypothyroidism, GERD     Clinical Impressions Patient admitted for the diagnosis above.  PTA she lives at home and her son stays at night time.  Patient normally walks household distances with and without with RW, and performs her own ADL at home.  Son assists with community mobility and iADL's.  Primary deficits are poor activity tolerance, poor dynamic balance and increasing O2 needs.  Patient demonstrates good safety, but is needing increased assist compared to baseline.  Min A for basic bed mobility (patient sleeps in a recliner at home), Min A for lower body ADL, Min A for sit to stand and generalized supervision for in room mobility at a RW level.  OT will continue efforts in the acute setting to address deficits, and patient politely declines HH Rehab.       If plan is discharge home, recommend the following:   Help with stairs or ramp for entrance;A little help with bathing/dressing/bathroom;A little help with walking and/or transfers;Assistance with cooking/housework;Assist for transportation     Functional Status Assessment   Patient has had a recent decline in their functional status and demonstrates the ability to make significant improvements in function in a reasonable and predictable amount of time.     Equipment Recommendations   None recommended by OT     Recommendations for Other Services         Precautions/Restrictions   Precautions Precautions: Fall Recall of Precautions/Restrictions:  Intact Precaution/Restrictions Comments: monitor 02 Restrictions Weight Bearing Restrictions Per Provider Order: No     Mobility Bed Mobility Overal bed mobility: Needs Assistance Bed Mobility: Supine to Sit     Supine to sit: Mod assist       Patient Response: Cooperative  Transfers Overall transfer level: Needs assistance Equipment used: Rolling walker (2 wheels) Transfers: Sit to/from Stand, Bed to chair/wheelchair/BSC Sit to Stand: Min assist     Step pivot transfers: Supervision            Balance Overall balance assessment: Needs assistance Sitting-balance support: Feet supported Sitting balance-[name]: Good     Standing balance support: Reliant on assistive device for balance Standing balance-[name]: Poor                             ADL either performed or assessed with clinical judgement   ADL       Grooming: Wash/dry hands;Wash/dry face;Oral care;Supervision/safety;Standing           Upper Body Dressing : Supervision/safety;Sitting;Standing   Lower Body Dressing: Minimal assistance;Sit to/from stand   Toilet Transfer: Minimal assistance;Rolling walker (2 wheels);Regular Toilet;Ambulation             General ADL Comments: mild assist for sit to stand, assist for lines     Vision Patient Visual Report: No change from baseline       Perception Perception: Not tested       Praxis Praxis: Not tested       Pertinent Vitals/Pain Pain Assessment Pain Assessment: Faces Faces Pain Scale: Hurts a little  bit Pain Location: Headache Pain Descriptors / Indicators: Headache Pain Intervention(s): Monitored during session, Patient requesting pain meds-RN notified     Extremity/Trunk Assessment Upper Extremity Assessment Upper Extremity Assessment: Generalized weakness   Lower Extremity Assessment Lower Extremity Assessment: Defer to PT evaluation   Cervical / Trunk Assessment Cervical / Trunk Assessment:  Kyphotic   Communication Communication Communication: No apparent difficulties   Cognition Arousal: Alert Behavior During Therapy: WFL for tasks assessed/performed Cognition: No apparent impairments                               Following commands: Intact       Cueing  General Comments   Cueing Techniques: Verbal cues   VSS on O2   Exercises     Shoulder Instructions      Home Living Family/patient expects to be discharged to:: Private residence Living Arrangements: Children Available Help at Discharge: Family;Available PRN/intermittently Type of Home: House Home Access: Stairs to enter [employer] of Steps: 1 Entrance Stairs-Rails: Can reach both Home Layout: One level     Bathroom Shower/Tub: Walk-in shower;Sponge bathes at baseline   [employer]: Standard Bathroom Accessibility: Yes   Home Equipment: Agricultural consultant (2 wheels);Rollator (4 wheels);Grab bars - tub/shower;Shower seat;Cane - quad   Additional Comments: she reports she has had 2 falls in the shower and does not get in the shower frequently. sleeps in a recliner chair. baseline at 2 L02      Prior Functioning/Environment Prior Level of Function : Needs assist             Mobility Comments: uses rollator in the community and needs help for oxygen management when outside the home. inside the home, she is ambulatory without assistance and without device ADLs Comments: son assists with cleaning and provides transportation    OT Problem List: Decreased activity tolerance;Decreased strength   OT Treatment/Interventions: Self-care/ADL training;Therapeutic activities;Therapeutic exercise;Patient/family education;Balance training;DME and/or AE instruction      OT Goals(Current goals can be found in the care plan section)   Acute Rehab OT Goals Patient Stated Goal: Return home with assist from son OT Goal Formulation: With patient Time For Goal Achievement:  05/29/23 Potential to Achieve Goals: Good ADL Goals Pt Will Perform Grooming: with modified independence;standing Pt Will Perform Lower Body Dressing: with modified independence;sit to/from stand Pt Will Transfer to Toilet: with modified independence;regular height toilet;ambulating Pt/caregiver will Perform Home Exercise Program: Increased strength;Both right and left upper extremity;With Supervision   OT Frequency:  Min 2X/week    Co-evaluation              AM-PAC OT "6 Clicks" Daily Activity     Outcome Measure Help from another person eating meals?: None Help from another person taking care of personal grooming?: A Little Help from another person toileting, which includes using toliet, bedpan, or urinal?: A Little Help from another person bathing (including washing, rinsing, drying)?: A Little Help from another person to put on and taking off regular upper body clothing?: A Little Help from another person to put on and taking off regular lower body clothing?: A Little 6 Click Score: 19   End of Session Equipment Utilized During Treatment: Rolling walker (2 wheels);Oxygen Nurse Communication: Mobility status  Activity Tolerance: Patient tolerated treatment well Patient left: in chair;with call bell/phone within reach;with chair alarm set  OT Visit Diagnosis: Unsteadiness on feet (R26.81);Muscle weakness (generalized) (M62.81)  Time: 1430-1500 OT Time Calculation (min): 30 min Charges:  OT General Charges $OT Visit: 1 Visit OT Evaluation $OT Eval Moderate Complexity: 1 Mod OT Treatments $Self Care/Home Management : 8-22 mins  05/15/2023  RP, OTR/L  Acute Rehabilitation Services  Office:  [phone]   Suzanna Obey 05/15/2023, 3:20 PM

## 2023-05-15 NOTE — Assessment & Plan Note (Signed)
Continue blood pressure control with metoprolol, hydralazine and isosorbide.

## 2023-05-15 NOTE — Assessment & Plan Note (Addendum)
 Continue rate control with metoprolol .  Off amiodarone  due to possible lung toxicity  Patient not on anticoagulation.

## 2023-05-15 NOTE — Consult Note (Addendum)
 Cardiology Consultation   Patient ID: Mary Mccarthy MRN: [id number]; DOB: 10/25/1940  Admit date: 05/14/2023 Date of Consult: 05/15/2023  PCP:  Corwin Levins, MD   [facility] Providers Cardiologist:  Bryan Lemma, MD  Electrophysiologist:  Thurmon Fair, MD  {   Cc: shortness of breath, swelling, weight gain.  Reason of consult: CHF Requesting provider: Arrien, York Ram,*  Patient Profile:   Mary Mccarthy is a 83 y.o. female with a hx of HTN, HLD, chronic respiratory failure on 2 L oxygen at baseline,chronic diastolic heart failure, persistent A. Fib not on chronic AC due to recurrent GI bleed, history of PUD/AVM, history of CVA, severe AS s/p TAVR (26 mm Medtronic Evolut Pro+ THV via TF approach in 10/2020) complicated by transient complete heart block, type 2 diabetes, mild CAD by pre-TAVR cath 09/2020, hypothyroidism, GERD, anemia previously requiring transfusions who is being seen 05/15/2023 for the evaluation of acute on chronic diastolic heart failure at the request of Dr. Ella Jubilee.  History of Present Illness:   Mary Mccarthy has past medical history as stated above. She presented to [facility] ED on 05/14/2023 complaining of shortness of breath. She stated the shortness of breath was gradual, starting three days prior to admission, was constant. She admitted having associated LE edema, gradual weight gain,cough, wheezing, and vomiting. She denied any abdominal pain, fever, headaches, neck pain. She states that she wears 2 L oxygen at baseline but was not oxygen while waiting in the ER so her initial SpO2 in the 70s, which quickly recovered after being placed back on supplemental oxygen.Patient reports that in response to her symptoms she began taking extra doses of Lasix, 80 mg daily for the past week, but did not have any significant improvement.   While in the ED, she was noted to be afebrile with RR up to 36, BP got up to 196/70, and oxygen  requirement was 4 L via [place]. Labs showed: anemia (with hemoglobin 8,1 baseline 9-10), creatinine 1.98 (was noted to be 2.01 one month prior), elevated BNP 977, troponin levles 35 > 31, negative respiratory panel, elevated lactic acid at 2, elevated THS 5.6, EKG showed NSR with nonspecific IVCD, CXR showed mild to moderate pulmonary vascular congestion.  Patient was give IV Lasix 40 mg x 1 dose and IV solumedrol 125 mg in the ED. She was then admitted to medicine service. They gave another dose of IV Lasix 40 mg, then started IV Lasix 60 mg BID, for hypertensive urgency she was started on hydralazine 25 mg TID, Imdur 15 mg daily and Toprol 12.5 mg daily. Amiodarone 200 mg daily was resumed for paroxysmal atrial fibrillation. Continued on Lipitor 40 mg daily. Cardiology was asked to consult in the setting of acute on chronic CHF.  Per cardiac history, she was last seen in the outpatient setting 03/2023 by Azalee Course, PA-C for post-hospital follow up where she was admitted for shortness of breath. She reported being compliant with Lasix 60 mg daily since discharge from the hospital. She appears to be euvolemic on exam this day and was instructed to take daily weights and take extra 20 mg of Lasix in the setting of daily weight gain of 3 lb. Her current treatment regimen was: PO Lasix 60 mg daily with PRN additional dose as discussed above, amiodarone 200 mg daily for PAF, no AC in the setting of recurrent GI bleed, Lipitor 40 mg for goal LDL < 70, hypertension being treated with hydralazine 25 mg TID,  Imdur 15 mg daily, Toprol 12.5 mg daily.   After speaking with the patient, she agrees with the history as stated above. She states that on Friday, 4/5, is when she began feeling symptoms. She states it started with LE edema, then she was finding herself to be increasing short of breath, especially on exertion. When asked about compliance to her medications, she stated that there was some medication that she believed  was unable to be filled by her provider. She tells me that medication was amlodipine, which is not currently listed on her home medications, therefore unsure of what medication she was really taking/not taking. She does live alone at home, as her husband died within the last 5 years. She says that she has family come and stay with her and check in on her regularly but there is no family present in the room today to verify. She says that she started wearing home oxygen consistently at 2 L when she first received it Feb. 2025. She stated that she never had to wear any oxygen before. Patient states that she had an appointment scheduled for mid-May to establish care with a pulmonologist in regards to chronic hypoxic respiratory failure. She sleeps in a recliner at home chronically because she is unable to lay flat. Tells me that she takes her weight daily at home and noticed an increase but cannot recall the numbers. She tells me both that she believes her dry weight to be 190s and then 182 lb. She feels very slightly improved to when she first got here, had an initial session with PT where she said she got fairly out of breath just getting up and walking short distances in her room.   Past Medical History:  Diagnosis Date   ALLERGIC RHINITIS 06/24/2007   Anemia    AVM (arteriovenous malformation) of colon    Blood transfusion without reported diagnosis 05/2015   COLONIC POLYPS, HX OF 06/24/2007   Coronary artery disease, non-occlusive 05/2015   Trop + w/ Acute Anemia =>CATH: small RI - Ostial 60%, ostial RCA 30% and dLAD 40-50%;; 9/'21: Cor Ca Score 624. Mild (25-49%) prox RCA & LAD,; Moderate (50-69%) Ostial Small RI & prox LCx.     DEPRESSION 10/08/2006   DIABETES MELLITUS, TYPE II 10/05/2006   GERD 06/24/2007   History of CVA (cerebrovascular accident) 08/10/2020   08/2020- found to be in afib with RVR, started on Eliquis (shower of emboli from A. fib as complication of severe AS)   HYPERLIPIDEMIA  10/08/2006   HYPERTENSION 10/08/2006   INSOMNIA 09/24/2008   Left knee DJD    Nonsustained paroxysmal ventricular tachycardia (HCC) 10/2018   Post-TAVR Zio Patch: Frequent (206) runs of NSVT - Fastest 5 beats 239 bpm. Longest 9.3 Sec avg 135 bpm. (? possibly Afib w/ aberrancy)   Osteoporosis 03/10/2016   PAF (paroxysmal atrial fibrillation) (HCC)    PAF with RVR: CHA2DS2-VASc 9.  On Eliquis and amiodarone. 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 Clifton James & Dr. Laneta Simmers   Severe aortic stenosis 08/2020   Progression from Mod-Severe AS to Severe AS - noted on Echo 08/2020 -> (mean gradient progressed from 34 to 42 mmHg) this was in setting of new onset A. fib RVR, acute diastolic HF and shower of emboli CVA. ->  Referred for TAVR, completed 10/26/2020   Past Surgical History:  Procedure Laterality Date   APPENDECTOMY     BIOPSY  10/21/2020   Procedure: BIOPSY;  Surgeon: Imogene Burn, MD;  Location: [facility] ENDOSCOPY;  Service: Gastroenterology;;   BREAST BIOPSY     CARDIAC CATHETERIZATION N/A 05/21/2015   Procedure: Left Heart Cath and Coronary Angiography;  Surgeon: [name], MD;  Location: [facility] INVASIVE CV LAB;  Service: Cardiovascular;: Ost RI 60%, Ost RCA 30%, dLAD tapers to small vessel w/ 40-50%. Mildly elevated LVEDP. Normal LV Fxn.   COLONOSCOPY N/A 05/20/2015   Procedure: COLONOSCOPY;  Surgeon: Ruffin Frederick, MD;  Location: [facility] ENDOSCOPY;  Service: Gastroenterology;  Laterality: N/A;   CORONARY CA2+ SCORE / CARDIAC CT ANGIOGRAM  10/09/2019   Calcium score 624.  82nd percentile. Dominant RCA: Mild (25-49%) proximal stenosis-distal bifurcation into PDA and PAV--< RPL branches.  LAD (1 major mid vessel diagonal) diffuse calcified plaque, mild proximal stenosis with minimal distal stenosis.  Small RI moderate ostial disease.  LCx-moderate  mixed (50-69%) proximal stenosis.  Small dOM1 disease.  Trileaflet AoV, annular Ca2+ - probable AS   ESOPHAGOGASTRODUODENOSCOPY N/A 05/20/2015   Procedure: ESOPHAGOGASTRODUODENOSCOPY (EGD);  Surgeon: Ruffin Frederick, MD;  Location: [facility] ENDOSCOPY;  Service: Gastroenterology;  Laterality: N/A;   ESOPHAGOGASTRODUODENOSCOPY (EGD) WITH PROPOFOL N/A 10/21/2020   Procedure: ESOPHAGOGASTRODUODENOSCOPY (EGD) WITH PROPOFOL;  Surgeon: Imogene Burn, MD;  Location: [facility] ENDOSCOPY;  Service: Gastroenterology;  Laterality: N/A;   INTRAOPERATIVE TRANSTHORACIC ECHOCARDIOGRAM N/A 10/26/2020   Procedure: INTRAOPERATIVE TRANSTHORACIC ECHOCARDIOGRAM;  Surgeon: Clifton James; Location: [facility] OR; Pre-TAVR well-visualized calcified AoV mean Grad 37 mmHg, AVA 0.72 cm => Post TAVR well-positioned supra-annular 26 mm Medtronic Evolut Pro valve placed with no PVL.  Mean gradient 14 mmHg.  AVR 1.58 cm.  Normal flow to RCA and LM-LAD.  ; EF 60 to 65%.  Degenerative severe MAC w/ mild MR.   RIGHT/LEFT HEART CATH AND CORONARY ANGIOGRAPHY N/A 09/29/2020   Procedure: RIGHT/LEFT HEART CATH AND CORONARY ANGIOGRAPHY;  Surgeon: Kathleene Hazel, MD;  Location: [facility] INVASIVE CV LAB;  Service: Cardiovascular; pre-TAVR:  pRCA 20%, m-dRCA 30%. D LM-pLAD 20%. RI 30%, mLAD 20%.  Severe AS with mean gradient measured at 52.8 mmHg.   TONSILLECTOMY     TRANSCATHETER AORTIC VALVE REPLACEMENT, TRANSFEMORAL N/A 10/26/2020   Procedure: TRANSCATHETER AORTIC VALVE REPLACEMENT, TRANSFEMORAL;  Surgeon: Kathleene Hazel, MD;  Location: [facility] OR;  Service: Open Heart Surgery;; Medtronic Evolut-Pro + (size 26 mm, model # EVPROPLUS -26US, serial # [id number]); transient high-grade AV block noted so PPM left in place.   TRANSTHORACIC ECHOCARDIOGRAM  03/17/2019   a) 03/2019: EF 60 to 65%.  Moderate LVH.  GRII DD.  Mod-Severe AS (m grad 36 mmHg, peak 59 mmHg); b) 09/2019: EF 65-70%, No RWMA. Gr1 DD/hi LAP, Mild hi PAP. Mod LA Dil. MOD AS (mean Grad 34.5 mmHg).   STABLE   TRANSTHORACIC ECHOCARDIOGRAM  08/27/2020   Admitted for CVA/Afib RVR & CHF (Progression to SEVERE SYMPTOMATIC AS):  Severe Calcific Aortic Stenosis (VTI AVA estimated 0.86 cm, mean gradient 42 mmHg, V-max 4.36 m/s).  EF 55 to 60%.  Severe concentric LVH.  Unable to determine diastolic parameters.  Moderately elevated PAP.  Mild LA dilation.  Mild circumferential pericardial effusion.  Trivial MR.  Severe MAC.   TRANSTHORACIC ECHOCARDIOGRAM  11/25/2020   1 Month s/p TAVR: EF 55 to 60%.  No R WMA.  GR 1 DD.  Elevated LVEDP.  Normal RV size with mildly elevated RVP estimated 44 mmHg.  Oscillating density in the RV  suspect calcified chordal apparatus versus calcified thrombus.  Moderate LA dilation.  Normal IVC/RA P => Well-positioned 26 mm Medtronic Evolut Pro THVwith mean AOV gradient 11 mmHg.  Trivial PVL   TRANSTHORACIC ECHOCARDIOGRAM  11/02/2020   a) Day 1 Post-op TAVR 10/27/20:  Well-Positioned Supra Annular Medtronic Evolut Pro THP.  Mean gradient 12 mmHg, peak 24 mmHg.  AVA 1.8 cm.  Trivial PVL.  EF 60 to 65%.  Normal LV function.  Mild LVH.  Severe LA dilation.  Mild RA dilation.  Severe MAC;; b) 11/02/20: Normal structure and function of the aortic valve prosthesis.  Mean gradient 9 mmHg (otherwise stable)   TUBAL LIGATION      Home Medications:  Prior to Admission medications   Medication Sig Start Date End Date Taking? Authorizing Provider  acetaminophen (TYLENOL) 500 MG tablet Take 500 mg by mouth every 6 (six) hours as needed for moderate pain.   Yes [provider]  ALPRAZolam (XANAX) 0.25 MG tablet Take 1 tablet (0.25 mg total) by mouth 3 (three) times daily as needed for anxiety. 03/14/23  Yes Corwin Levins, MD  atorvastatin (LIPITOR) 40 MG tablet TAKE 1 TABLET BY MOUTH EVERY DAY 09/04/22  Yes [name], MD  [employer] VITAMIN B12 1000 MCG tablet TAKE 1 TABLET BY MOUTH EVERY DAY 03/26/23  Yes Corwin Levins, MD  furosemide (LASIX) 20 MG tablet 3 tabs by mouth once  daily Patient taking differently: Take 80 mg by mouth daily. 3 tabs by mouth once daily 04/03/23  Yes Corwin Levins, MD  hydrALAZINE (APRESOLINE) 25 MG tablet Take 1 tablet (25 mg total) by mouth every 8 (eight) hours. 04/03/23  Yes Corwin Levins, MD  isosorbide mononitrate (IMDUR) 30 MG 24 hr tablet Take 0.5 tablets (15 mg total) by mouth daily. 04/03/23  Yes Corwin Levins, MD  levothyroxine (SYNTHROID) 25 MCG tablet TAKE 1 TABLET BY MOUTH EVERY DAY 03/26/23  Yes Corwin Levins, MD  metFORMIN (GLUCOPHAGE) 1000 MG tablet TAKE 1 TABLET BY MOUTH IN THE AM AND 1/2 IN THE PM 03/26/23  Yes Corwin Levins, MD  metoprolol succinate (TOPROL-XL) 25 MG 24 hr tablet TAKE 1/2 TABLET BY MOUTH DAILY 03/26/23  Yes Corwin Levins, MD  mirtazapine (REMERON) 15 MG tablet TAKE 1 TABLET BY MOUTH EVERYDAY AT BEDTIME 10/30/22  Yes Corwin Levins, MD  pantoprazole (PROTONIX) 40 MG tablet TAKE 1 TABLET BY MOUTH TWICE A DAY 12/07/22  Yes Janetta Hora, PA-C  solifenacin (VESICARE) 5 MG tablet Take 1 tablet (5 mg total) by mouth daily. 03/14/23  Yes Corwin Levins, MD  amiodarone (PACERONE) 200 MG tablet TAKE 1 TABLET BY MOUTH EVERY DAY Patient not taking: Reported on 05/14/2023 02/06/23   Tonny Bollman, MD  blood glucose meter kit and supplies Dispense based on patient and insurance preference. Use up to four times daily as directed. E11.9 04/15/18   Corwin Levins, MD  Blood Glucose Monitoring Suppl (ONE TOUCH ULTRA 2) w/Device KIT Use as directed daily 06/01/15   Corwin Levins, MD  citalopram (CELEXA) 20 MG tablet Take 1 tablet (20 mg total) by mouth daily. Patient not taking: Reported on 05/14/2023 09/25/22   Corwin Levins, MD  Lancets [facility] DELICA PLUS [place]) MISC USE AS DIRECTED TEST 1-2 TIMES A DAY 09/05/20   Corwin Levins, MD  [facility] ULTRA test strip USE AS INSTRUCTED TEST 1-2 TIMES A DAY 10/12/20   Corwin Levins, MD   Inpatient Medications:  Scheduled Meds:  amiodarone  200 mg Oral Daily   atorvastatin  40 mg Oral Daily    enoxaparin (LOVENOX) injection  30 mg Subcutaneous Q24H   fesoterodine  4 mg Oral Daily   furosemide  60 mg Intravenous BID   hydrALAZINE  25 mg Oral Q8H   isosorbide mononitrate  15 mg Oral Daily   levothyroxine  25 mcg Oral Daily   metoprolol succinate  12.5 mg Oral Daily   mirtazapine  15 mg Oral QHS   pantoprazole  40 mg Oral BID   sodium chloride flush  3 mL Intravenous Q12H   Continuous Infusions:   PRN Meds: acetaminophen, ALPRAZolam, hydrALAZINE, ipratropium-albuterol, sodium chloride flush, trimethobenzamide  Allergies:    Allergies  Allergen Reactions   Ibuprofen Other (See Comments)    Bleeding events   Social History:   Social History   Socioeconomic History   Marital status: Widowed    Spouse name: Not on file   Number of children: 1   Years of education: Not on file   Highest education level: Not on file  Occupational History   Occupation: retired - Oceanographer: RETIRED  Tobacco Use   Smoking status: Never   Smokeless tobacco: Never  Vaping Use   Vaping status: Never Used  Substance and Sexual Activity   Alcohol use: No    Alcohol/week: 0.0 standard drinks of alcohol   Drug use: No   Sexual activity: Not Currently  Other Topics Concern   Not on file  Social History Narrative   Recently widowed--husband died in 29-Nov-2018.      Now lives alone but her son usually stays with her at night.  During the day she has 2 nephews who live nearby to come in and check on her intermittently.  Also one of her nieces calls routinely.      When her son is home and awake, she may try to walk on a treadmill, but is scared to walk outside.   Social Drivers of Corporate investment banker Strain: Low Risk  (05/11/2022)   Overall Financial Resource Strain (CARDIA)    Difficulty of Paying Living Expenses: Not hard at all  Food Insecurity: No Food Insecurity (05/14/2023)   Hunger Vital Sign    Worried About Running Out of Food in the Last  Year: Never true    Ran Out of Food in the Last Year: Never true  Recent Concern: Food Insecurity - Food Insecurity Present (03/01/2023)   Hunger Vital Sign    Worried About Running Out of Food in the Last Year: Sometimes true    Ran Out of Food in the Last Year: Never true  Transportation Needs: No Transportation Needs (05/14/2023)   PRAPARE - Administrator, Civil Service (Medical): No    Lack of Transportation (Non-Medical): No  Physical Activity: Inactive (05/11/2022)   Exercise Vital Sign    Days of Exercise per Week: 0 days    Minutes of Exercise per Session: 0 min  Stress: No Stress Concern Present (05/11/2022)   [employer] of Occupational Health - Occupational Stress Questionnaire    Feeling of Stress : Not at all  Social Connections: Socially Isolated (05/14/2023)   Social Connection and Isolation Panel [NHANES]    Frequency of Communication with Friends and Family: More than three times a week    Frequency of Social Gatherings with Friends and Family: Once a week    Attends Religious Services:  Never    Active Member of Clubs or Organizations: No    Attends Banker Meetings: Never    Marital Status: Widowed  Intimate Partner Violence: Not At Risk (05/14/2023)   Humiliation, Afraid, Rape, and Kick questionnaire    Fear of Current or Ex-Partner: No    Emotionally Abused: No    Physically Abused: No    Sexually Abused: No    Family History:   Family History  Problem Relation Age of Onset   Alcohol abuse Other    Arthritis Other        DJD   Hyperlipidemia Other    Heart disease Other    Stroke Other    Hypertension Other    Depression Other    Diabetes Other    Cancer Mother        ENT cancer   CAD Sister        Several MI & PPM; long term smoker, EtOH   Heart attack Sister 84       several   Arrhythmia Sister        s/p PPM (Dr. Royann Shivers)    ROS:  Please see the history of present illness.  All other ROS reviewed and negative.      Physical Exam/Data:   Vitals:   05/15/23 0721 05/15/23 0740 05/15/23 1015 05/15/23 1135  BP:  (!) 151/107 (!) 151/61 (!) 155/126  Pulse: 66 68 (!) 58 60  Resp: 18 19 18  (!) 23  Temp:  97.6 F (36.4 C)  97.7 F (36.5 C)  TempSrc:  Oral  Oral  SpO2: 98% 98% 100% 92%  Weight:      Height:       Intake/Output Summary (Last 24 hours) at 05/15/2023 1443 Last data filed at 05/15/2023 1250 Gross per 24 hour  Intake 243 ml  Output 1300 ml  Net -1057 ml      05/15/2023    5:04 AM 05/14/2023    6:56 PM 05/14/2023    8:49 AM  Last 3 Weights  Weight (lbs) 194 lb 14.2 oz 191 lb 12.8 oz 187 lb  Weight (kg) 88.4 kg 87 kg 84.823 kg     Body mass index is 36.82 kg/m.  General:  In no acute distress, currently on 2.5 L oxygen via [place] HEENT: normal Neck: + JVD Vascular: distal pulses 2+ bilaterally Cardiac:  normal S1, S2; RRR; systolic murmur present  Lungs:  diffuse decreased breath sounds with bilateral crackles present  Abd: soft, nontender Ext: 2+ bilateral pitting edema, slightly tender to palpation of LE Musculoskeletal:  No deformities Skin: warm and dry  Neuro:  no focal abnormalities noted Psych:  Normal affect   EKG:  The EKG was personally reviewed and demonstrates:  sinus rhythm, HR 62, nonspecific IVCD  Telemetry:  Telemetry was personally reviewed and demonstrates: sinus rhythm, HR 70s  Relevant CV Studies: Echocardiogram, updated ordered, pending results   Echocardiogram, 11/2022 IMPRESSIONS  1. Left ventricular ejection fraction, by estimation, is 60 to 65% . The left ventricle has normal function. The left ventricle has no regional wall motion abnormalities. There is mild concentric left ventricular hypertrophy. Left ventricular diastolic parameters are consistent with Grade II diastolic dysfunction (pseudonormalization). 2. Right ventricular systolic function was not well visualized. The right ventricular size is not well visualized. There is moderately elevated  pulmonary artery systolic pressure.  3. Left atrial size was severely dilated.  4. The mitral valve is grossly normal. Trivial mitral valve regurgitation. No evidence  of mitral stenosis. Severe mitral annular calcification.  5. The aortic valve has been repaired/ replaced. Aortic valve regurgitation is trivial. There is a 26 mm Medtronic stented (TAVR) valve present in the aortic position. Procedure Date: 10/26/2020. Echo findings are consistent with perivalvular leak of the aortic prosthesis. Aortic valve mean gradient measures 9. 0 mmHg.  6. The inferior vena cava is normal in size with greater than 50% respiratory variability, suggesting right atrial pressure of 3 mmHg.  RHC/LHC 09/2020, performed by Dr. Clifton James   Prox RCA lesion is 20% stenosed.   Mid RCA to Dist RCA lesion is 20% stenosed.   Ramus lesion is 30% stenosed.   Mid LAD lesion is 20% stenosed.   Dist LM to Prox LAD lesion is 20% stenosed.   Mild non-obstructive CAD Severe aortic stenosis (mean gradient 52.8 mmHg, peak to peak gradient 58 mmHg, AVA 0.90 cm2).   Laboratory Data:  High Sensitivity Troponin:   Recent Labs  Lab 05/14/23 0922 05/14/23 1340  TROPONINIHS 35* 31*     Chemistry Recent Labs  Lab 05/14/23 0922 05/15/23 0301  NA 135 135  K 4.1 3.9  CL 100 98  CO2 25 23  GLUCOSE 109* 158*  BUN 24* 30*  CREATININE 1.98* 1.78*  CALCIUM 9.4 8.9  GFRNONAA 24* 28*  ANIONGAP 10 14    Recent Labs  Lab 05/14/23 0922  PROT 7.8  ALBUMIN 3.2*  AST 58*  ALT 28  ALKPHOS 48  BILITOT 0.9   Lipids No results for input(s): "CHOL", "TRIG", "HDL", "LABVLDL", "LDLCALC", "CHOLHDL" in the last 168 hours.  Hematology Recent Labs  Lab 05/14/23 0922  WBC 5.2  RBC 2.78*  HGB 8.1*  HCT 27.1*  MCV 97.5  MCH 29.1  MCHC 29.9*  RDW 15.7*  PLT 223   Thyroid  Recent Labs  Lab 05/14/23 1340  TSH 5.654*    BNP Recent Labs  Lab 05/14/23 0922  BNP 977.8*    DDimer No results for input(s): "DDIMER" in the  last 168 hours.  Radiology/Studies:  DG Chest Portable 1 View Result Date: 05/14/2023 CLINICAL DATA:  Shortness of breath.  Cough. EXAM: PORTABLE CHEST 1 VIEW COMPARISON:  03/14/2023. FINDINGS: Low lung volume. Mild-to-moderate pulmonary vascular congestion with hilar predominance. Bilateral lung fields are otherwise clear. No dense consolidation or lung collapse. Bilateral costophrenic angles are clear. Moderately enlarged cardio-mediastinal silhouette, which is likely accentuated by low lung volume and AP technique. No acute osseous abnormalities. A well-defined ossific density along the superior and lateral aspect of the right humeral head is nonspecific but is most commonly due to calcific tendinopathy. The soft tissues are within normal limits. IMPRESSION: Mild-to-moderate pulmonary vascular congestion with hilar predominance. No dense consolidation or lung collapse. Electronically Signed   By: Jules Schick M.D.   On: 05/14/2023 10:03   Assessment and Plan:   Acute on chronic diastolic heart failure  Acute on chronic respiratory failure  Patient presented to the ED with shortness of breath, LE edema, weight gain that has been gradually worsening over the course of 2 weeks Stated that she was taking her extra PRN 20 mg PO Lasix during this time without any relief Recently started on home oxygen, reports using 2 L at baseline at home but presented to ED without any oxygen  She was just recently hospitalized 1/22-1/25/2025 for acute on chronic HFpEF where she was diuresed - 4 L with significant improvement in her symptoms Echo from 11/2022 showed: EF 60-65%, mild LVH, G2DD, moderately  elevated PASP, severely dilated LA, trivial MR, normal IVC  BNP elevated at 977 Net -800 cc since admission -- today will be the first day of Lasix 60 mg BID dosing  Weight 194 lb today, weight documented at 185 lb on outpatient visit on 03/2023 Creatinine trending down, 1.78 today compared to 1.98 yesterday CXR  showed mild to moderate pulmonary vascular congestion  Home meds: PO Lasix 60 mg daily with additional PRN 20 mg for weight gain/symptoms, Toprol 12.5 mg daily Currently on 2.5 L oxygen via [place] with SpO2 readings 99-100%  Patient feels just mildly improved from admission and still is feeling short of breath especially on exertion, orthopnea, LE edema Updated echo ordered, pending results  Continue IV Lasix 60 mg BID and monitor response, starting today at this dose  Continue strict I&O's, daily weights, BMPs  Mild nonobstructive CAD  Elevated troponin  Hyperlipidemia, goal LDL < 70 Troponin levels 35 > 31 EKG without acute ischemic changes  Denies any current or prior chest pain LHC 09/2020 showed: mild non-obstructive CAD with 20-30% stenosis in prox RCA, mid to distal RCA, ramus, mid LAD, distal left main to prox LAD 03/14/2023: HDL 30.10; LDL Cholesterol 51 05/14/2023: ALT 28  Suspect elevated troponin to be secondary to demand ischemia in the setting of acute CHF exacerbation  Continue Lipitor 40 mg daily    Hypertensive urgency  Presented with BP 196/70 most recent BP 151/61 Started back on home medication regimen while receiving IV Lasix Some medications are to be held due to intermittent sinus bradycardia -- patient does have 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 Continue home amiodarone 200 mg daily. She is nearing her  home baseline O2 requirement.  Recommend outpatient PFTs and based on results reconsider amiodarone +/- pulmonary consult.   Severe AS s/p TAVR 10/2020 Underwent successful TAVR with 26 mm Medtronic Evolut Pro+ THV via TF approach in September 2022  Echo from 11/2022 showed: s/p TAVR with perivalvular leak present, aortic valve mean gradient 9 mmHg, but no significant change from prior  Per primary CKD stage 3-4 Diabetes  Anxiety, depression Hypothyroidism  GERD Anemia   Risk Assessment/Risk Scores:      New York Heart Association (NYHA) Functional Class NYHA Class III  CHA2DS2-VASc Score = 9   This indicates a 12.2% annual risk of stroke. The patient's score is based upon: CHF History: 1 HTN History: 1 Diabetes History: 1 Stroke History: 2 Vascular Disease History: 1 Age Score: 2 Gender Score: 1      For questions or updates, please contact [facility] Please consult [email or web address] for contact info under    Signed, Tessa Lerner, DO  05/15/2023 2:43 PM  ADDENDUM:   Patient seen and examined. I personally taken a history, examined the patient, reviewed relevant notes,  laboratory data / imaging studies.  I performed a substantive portion of this encounter and formulated the important aspects of the  plan.  I agree with the APP's note, impression, and recommendations; however, I have edited the note to reflect changes or salient points.   Cardiology consulted during his hospitalization for heart failure management at the request of attending physician Arrien, York Ram,*.   Patient has a complex history as outlined above who presents to the hospital due to progressive shortness of breath, lower extremity swelling, weight gain.  At home patient usually takes Lasix 60 mg p.o. daily but due to her symptoms she started taking an additional 20 mg tablet with no significant improvement in symptoms and therefore presented to the ED further evaluation and  management.  Labs and EKG reviewed.  BNP elevated, chest x-ray noted vascular congestion.  LFTs are essentially within normal limits..  Her last heart failure hospitalization was in January 2025.  Thereafter followed up in the clinic at that time her weight was reported to be 185 pounds.  On arrival her weight is 194 pounds.    PHYSICAL EXAM: Today's Vitals   05/15/23 1015 05/15/23 1135 05/15/23 1423 05/15/23 1424  BP: (!) 151/61 (!) 155/126    Pulse: (!) 58 60    Resp: 18 (!) 23    Temp:  97.7 F (36.5 C)    TempSrc:  Oral    SpO2: 100% 92%    Weight:      Height:      PainSc:   3  3    Body mass index is 36.82 kg/m.   Net IO Since Admission: -1,057 mL [05/15/23 1443]  Filed Weights   05/14/23 0849 05/14/23 1856 05/15/23 0504  Weight: 84.8 kg 87 kg 88.4 kg    General: Age-appropriate female, hemodynamically stable, on nasal cannula oxygen HEENT: Normocephalic, atraumatic, dry mucous membranes, trachea midline, JVP Lungs: Expiratory wheezes noted bilaterally along with crackles throughout.  Normal inspiratory effort Heart: Regular, positive S1-S2, soft holosystolic murmur Abdomen: Soft, nontender, nondistended, positive bowel sounds in all 4 quadrants Extremities: Warm to touch, 2+ bilateral DP pulses, lower extremity swelling up to the mid thigh (iliotibial band) Neuro: Alert oriented x 4, moves all 4 extremities, strength 3+ in all 4 extremities Psych: Normal affect   EKG: (personally reviewed by me) May 14, 2023: Sinus rhythm, 63 bpm, suspect lead reversal  Telemetry: (personally reviewed by me) Sinus rhythm   Impression:  Acute exacerbation of heart failure with preserved EF. Persistent atrial fibrillation, currently sinus. Long-term antiarrhythmic medications. Mild nonobstructive CAD. Elevated troponins. 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 Severe aortic stenosis status post TAVR  10/2020  Recommendations:  Acute exacerbation of heart failure with preserved EF. -Clinically in acute decompensated heart failure -Physical examination findings of volume overload, BNP elevated, vascular congestion, -Weight at the last hospital discharge in January 2025, 185 pounds.  Weight on admission 194 pounds.  -Continue IV Lasix 60 mg twice daily -Continue hydralazine and isosorbide mononitrate combination. -Continue home dose beta-blocker therapy -Strict I's and O's and daily weights -Continue telemetry - Net IO Since Admission: -1,057 mL [05/15/23 1443]   Persistent atrial fibrillation, currently sinus. Long-term antiarrhythmic medications. Currently in sinus rhythm. Rate control Toprol-XL. Rhythm control: Amiodarone. Thromboembolic prophylaxis N/A due to prior bleeding and has been evaluated for watchman as well, see above and prior progress notes Patient is aware that not being on anticoagulation does predispose her to thromboembolic events and should be cognizant for symptoms and if present should come to the closest ER via EMS for further evaluation and management  Mild nonobstructive CAD. Denies anginal chest pain. EKG is nonischemic. Monitor for now  Elevated troponins. Demand ischemia in the setting of hypertensive urgency on arrival as well as acute decompensated heart failure. Echocardiogram pending.  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 Currently on isosorbide mononitrate and hydralazine. Would like to avoid ARB's, Arni, ACE inhibitors, MRA's, to help facilitate diuresis.  Will uptitrate GDMT closer to discharge  Severe aortic stenosis status post TAVR 10/2020: Stable, will await repeat echo, antibiotic prophylaxis prior to dental procedures.  Further recommendations to follow as the case evolves.   This note was created using a voice recognition software as a  result there may be grammatical errors inadvertently enclosed that do not reflect the nature of this encounter. Every attempt is made to correct such errors.   Tessa Lerner, DO, [facility]  [address] [place], [place] [address] Pager: [phone] Office: [phone] 05/15/2023 2:43 PM '

## 2023-05-15 NOTE — Progress Notes (Incomplete)
 PROGRESS NOTE    Mary Mccarthy  ZOX:[id number] DOB: 1941-01-17 DOA: 05/14/2023 PCP: Corwin Levins, MD  82/F w hypertension, hyperlipidemia, paroxysmal atrial fibrillation, aortic stenosis sp TAVR, T2DM and obesity who presented with dyspnea and lower extremity edema. In the ED, Hypoxic and Vol overloaded, cr 1,98  BNP 977, troponin 35 and 31 , CXR wpositive cardiomegaly, bilateral hilar vascular congestion with cephalization of the vasculature.   Subjective: Reports shortness of breath, congestion and cough  Assessment and Plan:  Acute on chronic diastolic CHF  Pulmonary hypertension -Echo now with EF 60-65%, normal RV, moderately elevated PASP, TAVR -Remains volume overloaded, 1.9 L negative so far -Mild bump in creatinine noted, continue current dose of IV Lasix, hydralazine, Imdur, metoprolol -GDMT limited by CKD  ?  Component of COPD -Likely contributing as well -Exposed to secondhand smoke for decades, rhonchi on exam as well, add scheduled DuoNebs  Persistent atrial fibrillation (HCC) Continue amiodarone and metoprolol.  Declines anticoagulation  Coronary artery disease, non-occlusive Stable, continue statin  SP TAVR 9/22  Hypertensive urgency Continue metoprolol, hydralazine and isosorbide.   CKD (chronic kidney disease), stage IV (HCC) -Mild uptrend with diuresis, cardiorenal, continue diuretics today -With hypotension, BMP in a.m.  Type 2 diabetes mellitus with hyperlipidemia (HCC) Continue insulin sliding scale for glucose cover and monitoring  Continue statin   Anxiety with depression Continue mirtazapine and alprazolam.   Hypothyroidism Continue with levothyroxine   Normocytic anemia -Check anemia panel   DVT prophylaxis: lovenox Code Status: Full Code Family Communication: None present Disposition Plan: Home pending improvement  Consultants:   :    Objective: Vitals:   05/15/23 0740 05/15/23 1015 05/15/23 1135 05/15/23 1610   BP: (!) 151/107 (!) 151/61 (!) 155/126 (!) 156/133  Pulse: 68 (!) 58 60 61  Resp: 19 18 (!) 23 18  Temp: 97.6 F (36.4 C)  97.7 F (36.5 C) 97.7 F (36.5 C)  TempSrc: Oral  Oral Oral  SpO2: 98% 100% 92% 96%  Weight:      Height:        Intake/Output Summary (Last 24 hours) at 05/15/2023 1813 Last data filed at 05/15/2023 1800 Gross per 24 hour  Intake 363 ml  Output 1800 ml  Net -1437 ml   Filed Weights   05/14/23 0849 05/14/23 1856 05/15/23 0504  Weight: 84.8 kg 87 kg 88.4 kg    Examination:      Data Reviewed:   CBC: Recent Labs  Lab 05/14/23 0922  WBC 5.2  NEUTROABS 3.9  HGB 8.1*  HCT 27.1*  MCV 97.5  PLT 223   Basic Metabolic Panel: Recent Labs  Lab 05/14/23 0922 05/15/23 0301  NA 135 135  K 4.1 3.9  CL 100 98  CO2 25 23  GLUCOSE 109* 158*  BUN 24* 30*  CREATININE 1.98* 1.78*  CALCIUM 9.4 8.9   GFR: Estimated Creatinine Clearance: 23.8 mL/min (A) (by C-G formula based on SCr of 1.78 mg/dL (H)). Liver Function Tests: Recent Labs  Lab 05/14/23 0922  AST 58*  ALT 28  ALKPHOS 48  BILITOT 0.9  PROT 7.8  ALBUMIN 3.2*   Recent Labs  Lab 05/14/23 0922  LIPASE 19   No results for input(s): "AMMONIA" in the last 168 hours. Coagulation Profile: No results for input(s): "INR", "PROTIME" in the last 168 hours. Cardiac Enzymes: No results for input(s): "CKTOTAL", "CKMB", "CKMBINDEX", "TROPONINI" in the last 168 hours. BNP (last 3 results) Recent Labs    03/14/23 0924  PROBNP 235.0*  HbA1C: No results for input(s): "HGBA1C" in the last 72 hours. CBG: Recent Labs  Lab 05/14/23 2042 05/15/23 1138 05/15/23 1612  GLUCAP 201* 144* 136*   Lipid Profile: No results for input(s): "CHOL", "HDL", "LDLCALC", "TRIG", "CHOLHDL", "LDLDIRECT" in the last 72 hours. Thyroid Function Tests: Recent Labs    05/14/23 1340  TSH 5.654*   Anemia Panel: No results for input(s): "VITAMINB12", "FOLATE", "FERRITIN", "TIBC", "IRON", "RETICCTPCT" in the  last 72 hours. Urine analysis:    Component Value Date/Time   COLORURINE YELLOW 05/14/2023 1148   APPEARANCEUR TURBID (A) 05/14/2023 1148   LABSPEC 1.014 05/14/2023 1148   PHURINE 5.0 05/14/2023 1148   GLUCOSEU NEGATIVE 05/14/2023 1148   GLUCOSEU NEGATIVE 03/15/2022 1217   HGBUR NEGATIVE 05/14/2023 1148   BILIRUBINUR NEGATIVE 05/14/2023 1148   KETONESUR NEGATIVE 05/14/2023 1148   PROTEINUR 30 (A) 05/14/2023 1148   UROBILINOGEN 0.2 03/15/2022 1217   NITRITE NEGATIVE 05/14/2023 1148   LEUKOCYTESUR NEGATIVE 05/14/2023 1148   Sepsis Labs: @LABRCNTIP (procalcitonin:4,lacticidven:4)  ) Recent Results (from the past 240 hours)  Resp panel by RT-PCR (RSV, Flu A&B, Covid) Anterior Nasal Swab     Status: None   Collection Time: 05/14/23  9:31 AM   Specimen: Anterior Nasal Swab  Result Value Ref Range Status   SARS Coronavirus 2 by RT PCR NEGATIVE NEGATIVE Final   Influenza A by PCR NEGATIVE NEGATIVE Final   Influenza B by PCR NEGATIVE NEGATIVE Final    Comment: (NOTE) The Xpert Xpress SARS-CoV-2/FLU/RSV plus assay is intended as an aid in the diagnosis of influenza from Nasopharyngeal swab specimens and should not be used as a sole basis for treatment. Nasal washings and aspirates are unacceptable for Xpert Xpress SARS-CoV-2/FLU/RSV testing.  Fact Sheet for Patients: BloggerCourse.com  Fact Sheet for Healthcare Providers: SeriousBroker.it  This test is not yet approved or cleared by the [place] FDA and has been authorized for detection and/or diagnosis of SARS-CoV-2 by FDA under an Emergency Use Authorization (EUA). This EUA will remain in effect (meaning this test can be used) for the duration of the COVID-19 declaration under Section 564(b)(1) of the Act, 21 U.S.C. section 360bbb-3(b)(1), unless the authorization is terminated or revoked.     Resp Syncytial Virus by PCR NEGATIVE NEGATIVE Final    Comment: (NOTE) Fact  Sheet for Patients: BloggerCourse.com  Fact Sheet for Healthcare Providers: SeriousBroker.it  This test is not yet approved or cleared by the [place] FDA and has been authorized for detection and/or diagnosis of SARS-CoV-2 by FDA under an Emergency Use Authorization (EUA). This EUA will remain in effect (meaning this test can be used) for the duration of the COVID-19 declaration under Section 564(b)(1) of the Act, 21 U.S.C. section 360bbb-3(b)(1), unless the authorization is terminated or revoked.  Performed at [facility], [address] [place], [place] [address]      Radiology Studies: ECHOCARDIOGRAM COMPLETE Result Date: 05/15/2023    ECHOCARDIOGRAM REPORT   Patient Name:   Mary Mccarthy Date of Exam: 05/15/2023 Medical Rec #:  [id number]                Height:       61.0 in Accession #:    [id number]               Weight:       194.9 lb Date of Birth:  12-01-1940                BSA:  1.868 m Patient Age:    82 years                 BP:           155/126 mmHg Patient Gender: F                        HR:           61 bpm. Exam Location:  Inpatient Procedure: 2D Echo, Cardiac Doppler and Color Doppler (Both Spectral and Color            Flow Doppler were utilized during procedure). Indications:    I50.40* Unspecified combined systolic (congestive) and diastolic                 (congestive) heart failure  History:        Patient has prior history of Echocardiogram examinations, most                 recent 11/13/2022. CHF, CAD, Stroke, Aortic Valve Disease; Risk                 Factors:Hypertension, Dyslipidemia and Diabetes. TAVR procedure.                 Aortic Valve: 26 mm Medtronic stented (TAVR) valve is present in                 the aortic position. Procedure Date: 10/26/2020.  Sonographer:    Sheralyn Boatman RDCS Referring Phys: [id number] RONDELL A SMITH  Sonographer Comments: Study delayed, patient in chair IMPRESSIONS   1. Left ventricular ejection fraction, by estimation, is 60 to 65%. The left ventricle has normal function. The left ventricle has no regional wall motion abnormalities. There is severe asymmetric left ventricular hypertrophy of the septal segment. Left  ventricular diastolic function could not be evaluated.  2. Right ventricular systolic function is normal. The right ventricular size is mildly enlarged. There is moderately elevated pulmonary artery systolic pressure. The estimated right ventricular systolic pressure is 53.7 mmHg.  3. Left atrial size was mildly dilated.  4. Right atrial size was mildly dilated.  5. The mitral valve is degenerative. Trivial mitral valve regurgitation. Mild mitral stenosis. The mean mitral valve gradient is 3.0 mmHg. Severe mitral annular calcification.  6. Tricuspid valve regurgitation is mild to moderate.  7. Mild paravalvular leak adjacent to the intervalvular fibrosa. The aortic valve has been repaired/replaced. Aortic valve regurgitation is mild. No aortic stenosis is present. There is a 26 mm Medtronic stented (TAVR) valve present in the aortic position. Procedure Date: 10/26/2020. Echo findings are consistent with normal structure and function of the aortic valve prosthesis. Echo findings are consistent with perivalvular leak of the aortic prosthesis. Aortic regurgitation PHT measures 514 msec.  Aortic valve mean gradient measures 9.0 mmHg. Aortic valve Vmax measures 1.88 m/s.  8. The inferior vena cava is dilated in size with <50% respiratory variability, suggesting right atrial pressure of 15 mmHg. Comparison(s): Prior images reviewed side by side. PVL is more prominent, stable RVSP. FINDINGS  Left Ventricle: Left ventricular ejection fraction, by estimation, is 60 to 65%. The left ventricle has normal function. The left ventricle has no regional wall motion abnormalities. Strain was performed and the global longitudinal strain is indeterminate. The left ventricular internal  cavity size was normal in size. There is severe asymmetric left ventricular hypertrophy of the septal segment. Left ventricular diastolic function could not be evaluated due to  mitral annular calcification (moderate or greater). Left ventricular diastolic function could not be evaluated. Right Ventricle: The right ventricular size is mildly enlarged. No increase in right ventricular wall thickness. Right ventricular systolic function is normal. There is moderately elevated pulmonary artery systolic pressure. The tricuspid regurgitant velocity is 3.11 m/s, and with an assumed right atrial pressure of 15 mmHg, the estimated right ventricular systolic pressure is 53.7 mmHg. Left Atrium: Left atrial size was mildly dilated. Right Atrium: Right atrial size was mildly dilated. Pericardium: There is no evidence of pericardial effusion. Mitral Valve: The mitral valve is degenerative in appearance. Severe mitral annular calcification. Trivial mitral valve regurgitation. Mild mitral valve stenosis. MV peak gradient, 11.8 mmHg. The mean mitral valve gradient is 3.0 mmHg with average heart rate of 59 bpm. Tricuspid Valve: The tricuspid valve is normal in structure. Tricuspid valve regurgitation is mild to moderate. No evidence of tricuspid stenosis. Aortic Valve: Mild paravalvular leak adjacent to the intervalvular fibrosa. The aortic valve has been repaired/replaced. Aortic valve regurgitation is mild. Aortic regurgitation PHT measures 514 msec. No aortic stenosis is present. Aortic valve mean gradient measures 9.0 mmHg. Aortic valve peak gradient measures 14.1 mmHg. Aortic valve area, by VTI measures 1.92 cm. There is a 26 mm Medtronic stented (TAVR) valve present in the aortic position. Procedure Date: 10/26/2020. Echo findings are consistent with normal structure and function of the aortic valve prosthesis. Pulmonic Valve: The pulmonic valve was normal in structure. Pulmonic valve regurgitation is not visualized. No  evidence of pulmonic stenosis. Aorta: The aortic root and ascending aorta are structurally normal, with no evidence of dilitation. Venous: The inferior vena cava is dilated in size with less than 50% respiratory variability, suggesting right atrial pressure of 15 mmHg. IAS/Shunts: The atrial septum is grossly normal. Additional Comments: 3D was performed not requiring image post processing on an independent workstation and was indeterminate.  LEFT VENTRICLE PLAX 2D LVIDd:         4.60 cm     Diastology LVIDs:         2.90 cm     LV e' medial:    5.55 cm/s LV PW:         1.10 cm     LV E/e' medial:  25.9 LV IVS:        1.50 cm     LV e' lateral:   6.64 cm/s LVOT diam:     2.00 cm     LV E/e' lateral: 21.7 LV SV:         81 LV SV Index:   43 LVOT Area:     3.14 cm  LV Volumes (MOD) LV vol d, MOD A2C: 84.9 ml LV vol d, MOD A4C: 68.3 ml LV vol s, MOD A2C: 27.4 ml LV vol s, MOD A4C: 31.5 ml LV SV MOD A2C:     57.5 ml LV SV MOD A4C:     68.3 ml LV SV MOD BP:      46.8 ml RIGHT VENTRICLE             IVC RV S prime:     10.90 cm/s  IVC diam: 2.70 cm TAPSE (M-mode): 2.2 cm LEFT ATRIUM             Index        RIGHT ATRIUM           Index LA diam:        5.20 cm 2.78 cm/m   RA Area:  21.80 cm LA Vol (A2C):   54.6 ml 29.23 ml/m  RA Volume:   64.90 ml  34.74 ml/m LA Vol (A4C):   62.4 ml 33.40 ml/m LA Biplane Vol: 59.4 ml 31.80 ml/m  AORTIC VALVE AV Area (Vmax):    1.93 cm AV Area (Vmean):   1.83 cm AV Area (VTI):     1.92 cm AV Vmax:           187.67 cm/s AV Vmean:          122.000 cm/s AV VTI:            0.420 m AV Peak Grad:      14.1 mmHg AV Mean Grad:      9.0 mmHg LVOT Vmax:         115.00 cm/s LVOT Vmean:        71.000 cm/s LVOT VTI:          0.257 m LVOT/AV VTI ratio: 0.61 AI PHT:            514 msec  AORTA Ao Root diam: 3.30 cm Ao Asc diam:  3.25 cm MITRAL VALVE                TRICUSPID VALVE MV Area (PHT): 4.60 cm     TR Peak grad:   38.7 mmHg MV Area VTI:   1.66 cm     TR Vmax:        311.00 cm/s MV  Peak grad:  11.8 mmHg MV Mean grad:  3.0 mmHg     SHUNTS MV Vmax:       1.72 m/s     Systemic VTI:  0.26 m MV Vmean:      74.7 cm/s    Systemic Diam: 2.00 cm MV Decel Time: 165 msec MV E velocity: 144.00 cm/s MV A velocity: 71.80 cm/s MV E/A ratio:  2.01 Riley Lam MD Electronically signed by Riley Lam MD Signature Date/Time: 05/15/2023/5:44:08 PM    Final    DG Chest Portable 1 View Result Date: 05/14/2023 CLINICAL DATA:  Shortness of breath.  Cough. EXAM: PORTABLE CHEST 1 VIEW COMPARISON:  03/14/2023. FINDINGS: Low lung volume. Mild-to-moderate pulmonary vascular congestion with hilar predominance. Bilateral lung fields are otherwise clear. No dense consolidation or lung collapse. Bilateral costophrenic angles are clear. Moderately enlarged cardio-mediastinal silhouette, which is likely accentuated by low lung volume and AP technique. No acute osseous abnormalities. A well-defined ossific density along the superior and lateral aspect of the right humeral head is nonspecific but is most commonly due to calcific tendinopathy. The soft tissues are within normal limits. IMPRESSION: Mild-to-moderate pulmonary vascular congestion with hilar predominance. No dense consolidation or lung collapse. Electronically Signed   By: Jules Schick M.D.   On: 05/14/2023 10:03     Scheduled Meds:  amiodarone  200 mg Oral Daily   atorvastatin  40 mg Oral Daily   enoxaparin (LOVENOX) injection  30 mg Subcutaneous Q24H   fesoterodine  4 mg Oral Daily   furosemide  60 mg Intravenous BID   hydrALAZINE  25 mg Oral Q8H   isosorbide mononitrate  15 mg Oral Daily   levothyroxine  25 mcg Oral Daily   metoprolol succinate  12.5 mg Oral Daily   mirtazapine  15 mg Oral QHS   pantoprazole  40 mg Oral BID   sodium chloride flush  3 mL Intravenous Q12H   Continuous Infusions:   LOS: 1 day    Time spent:  Zannie Cove, MD [facility]   05/15/2023, 6:13 PM

## 2023-05-15 NOTE — Discharge Instructions (Signed)
 [employer] assistance programs Crisis assistance programs  -[facility] Arts development officer. If you are experiencing homelessness in [place], [place], your first point of contact should be Pensions consultant. You can reach Coordinated Entry by calling ([phone] or by emailing [email or web address].  Community access points: [employer] [phone], [place]) every Tuesday from 9am-10am. [facility] ([address] [place] [place]) every Wednesday from 8am-9am.   -[place] Coordinated Re-entry Marcy Panning: Dial 211 and request. Offers referrals to homeless shelters in the area.    -The [employer] [phone]) offers several services to local families, as funding allows. The Emergency Assistance Program (EAP), which they administer, provides household goods, free food, clothing, and financial aid to people in need in the [facility] area. The EAP program does have some qualification, and counselors will interview clients for financial assistance by written referral only. Referrals need to be made by the Department of Social Services or by other EAP approved human services agencies or charities in the area.  -[employer], which can be reached at ([phone], offers emergency assistance programs for those in need of help, such as food, rent assistance, a soup kitchen, shelter, and clothing. They are based in [facility] but provide a number of services to those that qualify for assistance.   [facility] Department of Social Services may be able to offer temporary financial assistance and cash grants for paying rent and utilities, Help may be provided for local county residents who may be experiencing personal crisis when other resources, including government programs, are not available. Call [phone]  -[employer] is a [employer] agency, The organization can offer emergency assistance for paying rent, [employer], utilities, food, household products and furniture. They offer extensive emergency and transitional housing for families, children and single women, and also run a Boy's and [employer]. Thrift Shops, Secondary school teacher, and other aid offered too. [address] [place], [place] [address], [phone]  -[place] Low Income Energy Assistance Program -- This is offered for [facility] families. The federal government created [employer] Program provides a one-time cash grant payment to help eligible low-income families pay their electric and heating bills. [address], [place], [place] [address], [phone]  -[place] Emergency Assistance -- A program offers emergency utility and rent funds for greater [employer] area residents. The program can also provide counseling and referrals to charities and government programs. Also provides food and a free meal program that serves lunch Mondays - Saturdays and dinner seven days per week to individuals in the community. [address], [place], [place] [address], [phone]  -[employer] - Offers affordable apartment and housing communities across      [place] and [place]. The low income and seniors can access public housing, rental assistance to qualified applicants, and apply for the section 8 rent subsidy program. Other programs include Chiropractor and Engineer, maintenance. [address] [place], [place] [address], dial [phone].  -[facility] provides transitional housing to veterans and the disabled. Clients will also access other services too, including assistance in applying for Disability, life skills classes, case management, and assistance in finding permanent housing. [address], [place], [place]  [place] [address], call [phone]  -[facility] Transitional Housing through [facility] is for people who were just  evicted or that are formerly homeless. The non-profit will also help then gain self-sufficiency, find a home or apartment to live in, and also provides information on rent assistance when needed. Phone [phone]  -The [place] [employer] [facility] helps low income, elderly, or disabled residents in seven counties in the [place] ([place], [place], [place], [place], [place], [place], [place], and [place]) save energy and reduce their utility bills by improving energy efficiency. Phone [phone].  -[employer] is located in the [place] [facility] in the [employer], [address], [place], [place] [address]. Parking is in the rear of the building. Phone: [phone]   General Email: info@gsohc .org  [facility] provides free housing counseling assistance in locating affordable rental housing or housing with support services for families and individuals in crisis and the chronically homeless. We provide potential resources for other housing needs like utilities. Our trained counselors also work with clients on budgeting and financial literacy in effort to empower them to take control of their financial situations. [employer] collaborates with homeless service providers and other stakeholders as part of the [employer] COC (Continuum of Care). The (COC) is a regional/local planning body that coordinates housing and services funding for homeless families and individuals. The role of [facility] in the COC is through housing counseling to work with people we serve on diversion strategies for those that are at imminent risk of becoming homeless. We also work with the Coordinated Assessment/Entry Specialist who attempts to find temporary solutions and/or connects the people  to Housing First, Rapid Re-housing or transitional housing programs. Our Homelessness Prevention Housing Counselors meet with clients on business days (Monday-Fridays, except scheduled holidays) from 8:30 am to 4:30 pm.  Social Connections   PACE (Adult Program)  - Address: [address] [place], [place] [address] - General office #:  [phone] - Enrollment Phone #: [phone]  Institute of Aging  - Senior Friendship Line: call toll free, available 24 hours a day, at [phone]   [place] 211  [place] 2-1-1 is another useful way to locate resources in the community. Visit ShedSizes.ch to find service information online. If you need additional assistance, 2-1-1 Referral Specialists are available 24 hours a day, every day by dialing 2-1-1 or [phone] from any phone. The call is free, confidential, and available in any language.

## 2023-05-15 NOTE — Assessment & Plan Note (Signed)
 Cell count has been stable.

## 2023-05-15 NOTE — TOC CM/SW Note (Signed)
 Transition of Care [facility]) - Inpatient Brief Assessment   Patient Details  Name: Mary Mccarthy MRN: [id number] Date of Birth: Apr 25, 1940  [facility]) CM/SW Contact:    Michaela Corner, LCSWA Phone Number: 05/15/2023, 9:16 AM   Clinical Narrative: CSW reviewed SDOH needs and added social connection and utility resources to patients AVS.  TOC will continue to follow.     Transition of Care Asessment: Insurance and Status: Insurance coverage has been reviewed Patient has primary care physician: Yes Home environment has been reviewed: From Home Prior level of function:: Independent Prior/Current Home Services: No current home services Social Drivers of Health Review: SDOH reviewed interventions complete Readmission risk has been reviewed: No Transition of care needs: no transition of care needs at this time

## 2023-05-15 NOTE — Progress Notes (Signed)
 Progress Note   Patient: Tyreanna Bisesi QVZ:[id number] DOB: 01/25/1941 DOA: 05/14/2023     1 DOS: the patient was seen and examined on 05/15/2023   Brief hospital course: Mrs. Mcilvain was admitted to the hospital with the working diagnosis of heart failure exacerbation.  83 yo female with the past medical history of hypertension, hyperlipidemia, paroxysmal atrial fibrillation, aortic stenosis sp TAVR, T2DM and obesity who presented with dyspnea and lower extremity edema. Reported 2 weeks of worsening symptoms, along with weight gain. Her dyspnea and edema were persistent despite increasing the dose of her oral furosemide at home.  At the time of her initial physical examination her 02 saturation was 75% on room air, blood pressure 196/70, HR 62, RR 30.  Lungs with decreased breath sounds and rales bilaterally, up to mid zones, heart with S1 and S2 present and regular, abdomen with no distention and positive bilateral lower extremity, ++ pitting edema.   Na 135, K 4,1 Cl 100, bicarbonate 25 glucose 109, bun 24 cr 1,98  BNP 977 High sensitive troponin 35 and 31  Lactic acid 2,0 and 1,0  Wbc 5,2 hgb 8,1 plt 223  TSH 5,6  Sars covid 19 negative  Influenza negative  RSV negative  Urine analysis SG 1,014, protein 30, negative leukocytes and negative hgb   Chest radiograph with hypoinflation, positive cardiomegaly, bilateral hilar vascular congestion with cephalization of the vasculature.   EKG 62 bpm, right axis deviation, qtc 494, sinus rhythm with poor R R wave progression with no significant ST segment or T wave changes.    Assessment and Plan: * Acute on chronic diastolic CHF (congestive heart failure) (HCC) 2024 echocardiogram with preserved LV systolic function with EF 60 to 65%, mild LVH, RV not well visualized with signs of moderate elevation of RVSP, LA with severe dilatation, aortic valve SP TAVR with signs of perivalvular leak, RVSP 51.7 mmHg.   Patient continue volume  overloaded.   Plan to continue diuresis with IV furosemide 60 mg IV bid Afterload reduction with hydralazine and isosorbide.  B blocker with metoprolol.  Limited pharmacologic therapy due to reduced GFR.    Paroxysmal atrial fibrillation (HCC) Continue rhythm control with amiodarone and rate control with metoprolol.  Patient not on anticoagulation.   Coronary artery disease, non-occlusive Continue atorvastatin and blood pressure control   Hypertensive urgency Continue blood pressure control with metoprolol, hydralazine and isosorbide.   CKD (chronic kidney disease), stage IV (HCC) Renal function with serum cr at 1,78 with K at 3,9 and serum bicarbonate at 23  Na 135  Follow up renal function and electrolytes Continue diuresis with IV furosemide for volume overload.   Type 2 diabetes mellitus with hyperlipidemia (HCC) Continue insulin sliding scale for glucose cover and monitoring  Continue statin   Anxiety with depression Continue mirtazapine and alprazolam.   Hypothyroidism Continue with levothyroxine   Normocytic anemia Cell count has been stable.       Subjective: Patient continue to have dyspnea and edema, not back to her baseline, no chest pain, positive right foot pain   Physical Exam: Vitals:   05/15/23 0026 05/15/23 0504 05/15/23 0721 05/15/23 0740  BP: (!) 103/43   (!) 151/107  Pulse:   66 68  Resp:   18 19  Temp: 98.3 F (36.8 C) (!) 97.5 F (36.4 C)  97.6 F (36.4 C)  TempSrc: Oral Oral  Oral  SpO2:   98% 98%  Weight:  88.4 kg    Height:  Neurology awake and alert ENT with mild pallor Cardiovascular with S1 and S2 present and regular, positive systolic murmur at the apex, with no gallops or rubs Positive moderate JVD Respiratory with bilateral diffuse rales with no wheezing or rhonchi  Abdomen with no distention  Positive bilateral lower extremity ++++ pitting edema up to the hips  Data Reviewed:    Family Communication: no family  at the bedside   Disposition: Status is: Inpatient Remains inpatient appropriate because: IV diuresis   Planned Discharge Destination: Home     Author: Coralie Keens, MD 05/15/2023 7:54 AM  For on call review [email or web address].

## 2023-05-15 NOTE — Assessment & Plan Note (Addendum)
 Continue mirtazapine and alprazolam.

## 2023-05-15 NOTE — Assessment & Plan Note (Addendum)
 Continue insulin  sliding scale for glucose cover and monitoring  Basal insulin  20 units. Uncontrolled hyperglycemia with fasting glucose Will change dose of insulin  sliding scale for now, she may need pre meal coverage as well.  Likely steroid induced hyperglycemia.   Continue statin

## 2023-05-15 NOTE — Assessment & Plan Note (Addendum)
 Echocardiogram with preserved LV systolic function with EF 60 to 65%, severe LVH, RV systolic function preserved, RVSP 53.7 mmHg, LA and RA with mild dilatation, mild mitral stenosis, mild to moderate tricuspid valve regurgitation, sp TAVR with mild paravalvular leak   Documented urine output 525 ml, since admission negative fluid balance - 9,214 ml.   Afterload reduction with hydralazine  and isosorbide .  B blocker with metoprolol .  Holding furosemide  due to rising serum cr  Limited pharmacologic therapy due to reduced GFR.   Acute on chronic hypoxemic respiratory failure due to acute cardiogenic pulmonary edema. 04/17 Chest radiograph with bilateral interstitial infiltrates, with mild improvement on the right side.  Possible organizing pneumonia, possible amiodarone  toxicity discontinued amiodarone  Continue with systemic corticosteroids and bronchodilators.  Continue to decrease 02 requirements, today 02 saturation is 95% on 4 L/min per [place]

## 2023-05-16 DIAGNOSIS — I4819 Other persistent atrial fibrillation: Secondary | ICD-10-CM

## 2023-05-16 DIAGNOSIS — I130 Hypertensive heart and chronic kidney disease with heart failure and stage 1 through stage 4 chronic kidney disease, or unspecified chronic kidney disease: Secondary | ICD-10-CM

## 2023-05-16 DIAGNOSIS — E1169 Type 2 diabetes mellitus with other specified complication: Secondary | ICD-10-CM | POA: Diagnosis not present

## 2023-05-16 DIAGNOSIS — I5033 Acute on chronic diastolic (congestive) heart failure: Secondary | ICD-10-CM | POA: Diagnosis not present

## 2023-05-16 DIAGNOSIS — I2489 Other forms of acute ischemic heart disease: Secondary | ICD-10-CM

## 2023-05-16 DIAGNOSIS — Z952 Presence of prosthetic heart valve: Secondary | ICD-10-CM

## 2023-05-16 DIAGNOSIS — I2510 Atherosclerotic heart disease of native coronary artery without angina pectoris: Secondary | ICD-10-CM | POA: Diagnosis not present

## 2023-05-16 LAB — GLUCOSE, CAPILLARY
Glucose-Capillary: 154 mg/dL — ABNORMAL HIGH (ref 70–99)
Glucose-Capillary: 156 mg/dL — ABNORMAL HIGH (ref 70–99)
Glucose-Capillary: 146 mg/dL — ABNORMAL HIGH (ref 70–99)
Glucose-Capillary: 152 mg/dL — ABNORMAL HIGH (ref 70–99)

## 2023-05-16 LAB — BASIC METABOLIC PANEL WITH GFR
Anion gap: 11 (ref 5–15)
BUN: 35 mg/dL — ABNORMAL HIGH (ref 8–23)
CO2: 27 mmol/L (ref 22–32)
Calcium: 9.1 mg/dL (ref 8.9–10.3)
Chloride: 96 mmol/L — ABNORMAL LOW (ref 98–111)
Creatinine, Ser: 1.92 mg/dL — ABNORMAL HIGH (ref 0.44–1.00)
GFR, Estimated: 25 mL/min — ABNORMAL LOW (ref >60)
Glucose, Bld: 152 mg/dL — ABNORMAL HIGH (ref 70–99)
Potassium: 4.1 mmol/L (ref 3.5–5.1)
Sodium: 134 mmol/L — ABNORMAL LOW (ref 135–145)

## 2023-05-16 LAB — MAGNESIUM: Magnesium: 1.4 mg/dL — ABNORMAL LOW (ref 1.7–2.4)

## 2023-05-16 MED ORDER — GUAIFENESIN-DM 100-10 MG/5ML PO SYRP
15.0000 mL | ORAL_SOLUTION | ORAL | Status: DC | PRN
  Administered 2023-05-16 – 2023-05-30 (×15): 15 mL via ORAL
  Filled 2023-05-16 (×16): qty 15

## 2023-05-16 MED ORDER — ISOSORBIDE MONONITRATE ER 30 MG PO TB24
30.0000 mg | ORAL_TABLET | Freq: Every day | ORAL | Status: DC
  Administered 2023-05-17 – 2023-05-18 (×2): 30 mg via ORAL
  Filled 2023-05-16 (×2): qty 1

## 2023-05-16 MED ORDER — IPRATROPIUM-ALBUTEROL 0.5-2.5 (3) MG/3ML IN SOLN
3.0000 mL | Freq: Four times a day (QID) | RESPIRATORY_TRACT | Status: DC
  Administered 2023-05-16 – 2023-05-23 (×27): 3 mL via RESPIRATORY_TRACT
  Filled 2023-05-16 (×25): qty 3

## 2023-05-16 MED ORDER — MAGNESIUM SULFATE 4 GM/100ML IV SOLN
4.0000 g | Freq: Once | INTRAVENOUS | Status: AC
  Administered 2023-05-16: 4 g via INTRAVENOUS
  Filled 2023-05-16: qty 100

## 2023-05-16 NOTE — Progress Notes (Signed)
 Rounding Note    Patient Name: Mary Mccarthy Date of Encounter: 05/16/2023  [facility] Cardiologist: Bryan Lemma, MD  Chief Complaint:  Chief Complaint  Patient presents with   Shortness of Breath   Reason of consult: CHF  Subjective   Denies anginal chest pain. Shortness of breath improving. Still on nasal cannula oxygen  Inpatient Medications    Scheduled Meds:  amiodarone  200 mg Oral Daily   atorvastatin  40 mg Oral Daily   enoxaparin (LOVENOX) injection  30 mg Subcutaneous Q24H   fesoterodine  4 mg Oral Daily   furosemide  60 mg Intravenous BID   hydrALAZINE  25 mg Oral Q8H   insulin aspart  0-5 Units Subcutaneous QHS   insulin aspart  0-6 Units Subcutaneous TID WC   ipratropium-albuterol  3 mL Nebulization QID   [START ON 05/17/2023] isosorbide mononitrate  30 mg Oral Daily   levothyroxine  25 mcg Oral Daily   metoprolol succinate  12.5 mg Oral Daily   mirtazapine  15 mg Oral QHS   pantoprazole  40 mg Oral BID   sodium chloride flush  3 mL Intravenous Q12H   Continuous Infusions:  PRN Meds: acetaminophen, ALPRAZolam, guaiFENesin-dextromethorphan, hydrALAZINE, ipratropium-albuterol, sodium chloride flush, trimethobenzamide   Vital Signs    Vitals:   05/16/23 0245 05/16/23 0600 05/16/23 0700 05/16/23 0800  BP: (!) 175/68   (!) 152/52  Pulse: 85  81 79  Resp: 20  20 (!) 29  Temp: 97.8 F (36.6 C)     TempSrc: Oral     SpO2:   96% 93%  Weight:  85 kg    Height:        Intake/Output Summary (Last 24 hours) at 05/16/2023 0959 Last data filed at 05/16/2023 0932 Gross per 24 hour  Intake 483 ml  Output 2100 ml  Net -1617 ml      05/16/2023    6:00 AM 05/15/2023    5:04 AM 05/14/2023    6:56 PM  Last 3 Weights  Weight (lbs) 187 lb 6.3 oz 194 lb 14.2 oz 191 lb 12.8 oz  Weight (kg) 85 kg 88.4 kg 87 kg      Telemetry    SR - Personally Reviewed  ECG    No new tracing - Personally Reviewed  Physical Exam   General:  Age-appropriate female, hemodynamically stable, on nasal cannula oxygen HEENT: Normocephalic, atraumatic, dry mucous membranes, trachea midline, +JVP Lungs: Good inspiratory effort, bilateral crackles, no expiratory wheezing. Heart: Regular, positive S1-S2, soft holosystolic murmur Abdomen: Soft, nontender, nondistended, positive bowel sounds in all 4 quadrants Extremities: Warm to touch, 2+ bilateral DP pulses, lower extremity swelling up to the mid thigh (iliotibial band)-improving Neuro: Alert oriented x 4, moves all 4 extremities, strength 3+ in all 4 extremities Psych: Normal affect  Labs    High Sensitivity Troponin:   Recent Labs  Lab 05/14/23 0922 05/14/23 1340  TROPONINIHS 35* 31*     Chemistry Recent Labs  Lab 05/14/23 0922 05/15/23 0301 05/16/23 0258  NA 135 135 134*  K 4.1 3.9 4.1  CL 100 98 96*  CO2 25 23 27   GLUCOSE 109* 158* 152*  BUN 24* 30* 35*  CREATININE 1.98* 1.78* 1.92*  CALCIUM 9.4 8.9 9.1  MG  --   --  1.4*  PROT 7.8  --   --   ALBUMIN 3.2*  --   --   AST 58*  --   --   ALT 28  --   --  ALKPHOS 48  --   --   BILITOT 0.9  --   --   GFRNONAA 24* 28* 25*  ANIONGAP 10 14 11     Lipids No results for input(s): "CHOL", "TRIG", "HDL", "LABVLDL", "LDLCALC", "CHOLHDL" in the last 168 hours.  Hematology Recent Labs  Lab 05/14/23 0922  WBC 5.2  RBC 2.78*  HGB 8.1*  HCT 27.1*  MCV 97.5  MCH 29.1  MCHC 29.9*  RDW 15.7*  PLT 223   Thyroid  Recent Labs  Lab 05/14/23 1340  TSH 5.654*    BNP Recent Labs  Lab 05/14/23 0922  BNP 977.8*    DDimer No results for input(s): "DDIMER" in the last 168 hours.   Radiology    NA  Cardiac Studies   Echo  11/2022: LVEF 60 to 65%, grade 2 diastolic dysfunction, severely dilated left atrium, severe MAC, 26 mm Medtronic stent and TAVR valve present with paravalvular leak  05/2023:  1. Left ventricular ejection fraction, by estimation, is 60 to 65%. The  left ventricle has normal function. The  left ventricle has no regional  wall motion abnormalities. There is severe asymmetric left ventricular  hypertrophy of the septal segment. Left   ventricular diastolic function could not be evaluated.   2. Right ventricular systolic function is normal. The right ventricular  size is mildly enlarged. There is moderately elevated pulmonary artery  systolic pressure. The estimated right ventricular systolic pressure is  53.7 mmHg.   3. Left atrial size was mildly dilated.   4. Right atrial size was mildly dilated.   5. The mitral valve is degenerative. Trivial mitral valve regurgitation.  Mild mitral stenosis. The mean mitral valve gradient is 3.0 mmHg. Severe  mitral annular calcification.   6. Tricuspid valve regurgitation is mild to moderate.   7. Mild paravalvular leak adjacent to the intervalvular fibrosa. The  aortic valve has been repaired/replaced. Aortic valve regurgitation is  mild. No aortic stenosis is present. There is a 26 mm Medtronic stented  (TAVR) valve present in the aortic  position. Procedure Date: 10/26/2020. Echo findings are consistent with  normal structure and function of the aortic valve prosthesis. Echo  findings are consistent with perivalvular leak of the aortic prosthesis.  Aortic regurgitation PHT measures 514 msec.   Aortic valve mean gradient measures 9.0 mmHg. Aortic valve Vmax measures  1.88 m/s.   8. The inferior vena cava is dilated in size with <50% respiratory  variability, suggesting right atrial pressure of 15 mmHg.   Comparison(s): Prior images reviewed side by side. PVL is more prominent,  stable RVSP.    RHC/LHC 09/2020, performed by Dr. Clifton James   Prox RCA lesion is 20% stenosed.   Mid RCA to Dist RCA lesion is 20% stenosed.   Ramus lesion is 30% stenosed.   Mid LAD lesion is 20% stenosed.   Dist LM to Prox LAD lesion is 20% stenosed.   Mild non-obstructive CAD Severe aortic stenosis (mean gradient 52.8 mmHg, peak to peak gradient 58  mmHg, AVA 0.90 cm2).   Patient Profile     83 y.o. female with a hx of HTN, HLD, chronic respiratory failure on 2 L oxygen at baseline,chronic diastolic heart failure, persistent A. Fib not on chronic AC due to recurrent GI bleed, history of PUD/AVM, history of CVA, severe AS s/p TAVR (26 mm Medtronic Evolut Pro+ THV via TF approach in 10/2020) complicated by transient complete heart block, type 2 diabetes, mild CAD by pre-TAVR cath 09/2020, hypothyroidism, GERD,  anemia previously requiring transfusions who is being seen 05/15/2023 for the evaluation of acute on chronic diastolic heart failure at the request of Dr. Ella Jubilee.   Assessment & Plan    Impression:  Acute exacerbation of heart failure with preserved EF. Persistent atrial fibrillation, currently sinus. Long-term antiarrhythmic medications. Mild nonobstructive CAD. Elevated troponins, demand ischemia. 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 Severe aortic stenosis status post TAVR 10/2020   Recommendations:  Acute exacerbation of heart failure with preserved EF. -Clinically in acute decompensated heart failure -Physical examination findings of volume overload, BNP elevated, vascular congestion, -Weight at the last hospital discharge in January 2025, 185 pounds.   -Weight on admission 194 pounds.   -Continue IV Lasix 60 mg twice daily. -Bilateral compression stockings to help mobilize fluid -Monitor BUN and creatinine -Continue hydralazine and isosorbide mononitrate combination. -Increase Imdur 30 mg po qday.  -Continue home dose beta-blocker therapy -Strict I's and O's and daily weights -Continue telemetry - Net IO Since Admission: -2,444 mL [05/16/23 0959]   Persistent atrial fibrillation, currently sinus. Long-term antiarrhythmic medications. Currently in sinus rhythm. Rate control Toprol-XL. Rhythm control: Amiodarone. Thromboembolic prophylaxis N/A due to prior  bleeding and has been evaluated for watchman as well, see above and prior progress notes Patient is aware that not being on anticoagulation does predispose her to thromboembolic events and should be cognizant for symptoms and if present should come to the closest ER via EMS for further evaluation and management   Mild nonobstructive CAD. Denies anginal chest pain. EKG is nonischemic. Monitor for now   Elevated troponins. Demand ischemia in the setting of hypertensive urgency on arrival as well as acute decompensated heart failure. Echocardiogram pending.   Hyperlipidemia Continue current medical therapy   Hypertension with chronic kidney disease stage III/IV and congestive heart failure. Currently on isosorbide mononitrate and hydralazine. Would like to avoid ARB's, Arni, ACE inhibitors, MRA's, to help facilitate diuresis.  Will uptitrate GDMT closer to discharge   Severe aortic stenosis status post TAVR 10/2020: Stable, will await repeat echo, antibiotic prophylaxis prior to dental procedures.  Independently reviewed:  Telemetry  Echo 05/15/2023 Labs 05/16/2023 Progress notes since yesterday.    Prescription drug management: Increased Imdur    Coordination of care: Updated patient and attending physician      For questions or updates, please contact [facility] Please consult [email or web address] for contact info under     Signed, Tessa Lerner, DO, [facility]  [facility]  [address] [place], [place] [address] Pager: [phone] Office: [phone] 05/16/2023, 9:59 AM

## 2023-05-16 NOTE — Progress Notes (Addendum)
 Heart Failure Stewardship Pharmacist Progress Note   PCP: Corwin Levins, MD PCP-Cardiologist: Bryan Lemma, MD    HPI:  83 yo F with PMH of HTN, HLD, T2DM, CKD3, CAD, paroxysmal afib, recurrent GI bleed, severe aortic stenosis s/p TAVR complicated with transient heart block, respiratory failure on 2L [place] at BL, hypothyroidism, GERD   Patient previously admitted 02/2023 for CHF exacerbation. Patient was diuresed during admission and home lasix was increased from prn to daily.   Presented to the ED on 4/7 with worsening SOB with exertion and leg swelling over the last 2 weeks. Also presented with productive cough, chest pain/tightness, NVD, and wheezing. Reports PCP instructed her to take an additional dose of 20mg  lasix in the setting of weight gain of 3 lb - no improvement per pt. Endorses right flank pain 2/2 coughing.   In the ED, patient was hypertensive and hypoxic (did not present on her home O2) wheezing, rhonchi, rales and BL LE edema present on exam. Slight murmur present. JVD Patient diuresed with lasix 40mg  x2 in the ED. CXR showed mild to moderate pulmonary vascular congestion. No dense consolidation or lung collapse. ECHO 4/8 showed LVEF 60-65% (unchanged from prior 11/2022) with normal LV function. Severe asymmetric LV hypertrophy of septal segment. LV diastolic function could not be evaluated. Normal RV function. Mildly enlarged LV. MV is degenerative with trivial regurgitation. Mild stenosis and severe annular calcifications also present. Mild to moderate tricuspid valve regurgitation. Cardiology was consulted, planning with diuresis for now.   Patient reports SOB, congestion, and productive cough when laying down. Reports that symptoms have improved as compared to initial presentation to the hospital. 2+ BL pitting edema on exam. Patient reports not having side effects to PTA medications. Mentions that medications are expensive but unsure of the specific prices. States that her son  sometimes pays for her medications so he may know more information. Patient has reservations regarding utilizing TOC for discharge medications and switching pharmacies to [place] to avoid issues with insurance. Patient was informed that [facility] pharmacies will have access to her insurance information to bill her Medicare. Patient would like to defer discussion until a later date.     Current HF Medications: Diuretic: IV Lasix 60mg  BID  Beta Blocker: Metoprolol succinate 12.5mg  daily  Other: isosorbide mononitrate 15mg  daily, hydralazine 25mg  q8h  Prior to admission HF Medications: Diuretic: Lasix 60mg  daily Beta blocker: Metoprolol succinate 12.5mg  daily  Other: Isosorbide mononitrate 15mg  daily, hydralazine 25mg  q8h  Pertinent Lab Values: Serum creatinine 1.78>1.92 (BL 1.3-1.5?), BUN 35, Potassium 4.1, Sodium 134, BNP 977.8, Magnesium 2.2>1.4, A1c 6.5   Vital Signs: Weight: 187 lbs (admission weight: 191 lbs) Blood pressure: 190/70s - 150-170s/50-130s  Heart rate: 50-80s  I/O: net -1.19L yesterday; net -1.94L since admission 3L [place] > 7L  HFLNC  Medication Assistance / Insurance Benefits Check: Does the patient have prescription insurance?  Yes Type of insurance plan: Medicare  Outpatient Pharmacy:  Prior to admission outpatient pharmacy: [facility]  Is the patient willing to use [facility] TOC pharmacy at discharge? Yes Is the patient willing to transition their outpatient pharmacy to utilize a [facility] outpatient pharmacy? No    Assessment: 1. Acute on chronic diastolic CHF (LVEF 60-65%), due to unknown etiology . NYHA class 2-3 symptoms.Strict I/Os and daily weights. Keep K>4 and Mg>2.  - Due to oxygen status not at baseline and BL pitting edema, agree with continuing IV diuresis at this time and starting compression stockings  -  No ACE/ARB/ARNI or MRA given renal dysfunction - Defer titrating Metoprolol succinate to 25mg  daily for BP control until euvolemic  - Consider  HFpEF GDMT with SGLT2i - benefit has been seen with GFR >/=20 once AKI resolves     Plan: 1) Medication changes recommended at this time: - Replace Mg with 4g x1 - Increase hydralazine to 50mg  TID to increase BP control  - Monitor O2 status   2) Patient assistance: - Contacted son and he is interested in utilizing [facility] for medication changes upon discharge  but [facility] remaining [facility]  - Son reported that current medications are affordable  3)  Education  - Patient has been educated on current HF medications and potential additions to HF medication regimen - Patient verbalizes understanding that over the next few months, these medication doses may change and more medications may be added to optimize HF regimen - Patient has been educated on basic disease state pathophysiology and goals of therapy   Verdene Rio, PharmD PGY1 Pharmacy Resident

## 2023-05-16 NOTE — TOC Progression Note (Signed)
 Transition of Care [facility]) - Progression Note    Patient Details  Name: Mary Mccarthy MRN: [id number] Date of Birth: 05-30-1940  Transition of Care [facility]) CM/SW Contact  Mary Haven, RN Phone Number: 05/16/2023, 4:27 PM  Clinical Narrative:    NCM spoke with patient at the bedside, she states her PCP is Mary Mccarthy, she has a walker, cane and home oxygen 2 liters at home with [employer].  She gives this NCM permission to speak with any one of her family members, she lives with son Mary Mccarthy.  [facility].  Pta ambulatory with walker.  Per PT eval rec HHPT.  NCM offered choice, she states she does not want HHPT, she had it before and she does not want it this time.  NCM left agency list with her.         Expected Discharge Plan and Services                                               Social Determinants of Health (SDOH) Interventions SDOH Screenings   Food Insecurity: No Food Insecurity (05/14/2023)  Recent Concern: Food Insecurity - Food Insecurity Present (03/01/2023)  Housing: Low Risk  (05/14/2023)  Recent Concern: Housing - High Risk (03/05/2023)  Transportation Needs: No Transportation Needs (05/14/2023)  Utilities: At Risk (05/14/2023)  Alcohol Screen: Low Risk  (05/11/2022)  Depression (PHQ2-9): Low Risk  (03/14/2023)  Financial Resource Strain: Low Risk  (05/11/2022)  Physical Activity: Inactive (05/11/2022)  Social Connections: Socially Isolated (05/14/2023)  Stress: No Stress Concern Present (05/11/2022)  Tobacco Use: Low Risk  (05/14/2023)    Readmission Risk Interventions    11/04/2020   11:24 AM  Readmission Risk Prevention Plan  Transportation Screening Complete  PCP or Specialist Appt within 3-5 Days Complete  HRI or Home Care Consult Complete  Social Work Consult for Recovery Care Planning/Counseling Not Complete  Palliative Care Screening Not Applicable  Medication Review [employer]) Complete

## 2023-05-16 NOTE — Plan of Care (Signed)

## 2023-05-17 ENCOUNTER — Encounter (HOSPITAL_COMMUNITY): Payer: Self-pay | Admitting: Internal Medicine

## 2023-05-17 DIAGNOSIS — I2510 Atherosclerotic heart disease of native coronary artery without angina pectoris: Secondary | ICD-10-CM | POA: Diagnosis not present

## 2023-05-17 DIAGNOSIS — I4819 Other persistent atrial fibrillation: Secondary | ICD-10-CM | POA: Diagnosis not present

## 2023-05-17 DIAGNOSIS — E1169 Type 2 diabetes mellitus with other specified complication: Secondary | ICD-10-CM | POA: Diagnosis not present

## 2023-05-17 DIAGNOSIS — I5033 Acute on chronic diastolic (congestive) heart failure: Secondary | ICD-10-CM | POA: Diagnosis not present

## 2023-05-17 LAB — RETICULOCYTES
Immature Retic Fract: 15.7 % (ref 2.3–15.9)
RBC.: 2.58 MIL/uL — ABNORMAL LOW (ref 3.87–5.11)
Retic Count, Absolute: 34.8 10*3/uL (ref 19.0–186.0)
Retic Ct Pct: 1.4 % (ref 0.4–3.1)

## 2023-05-17 LAB — IRON AND TIBC
Iron: 17 ug/dL — ABNORMAL LOW (ref 28–170)
Saturation Ratios: 6 % — ABNORMAL LOW (ref 10.4–31.8)
TIBC: 293 ug/dL (ref 250–450)
UIBC: 276 ug/dL

## 2023-05-17 LAB — BASIC METABOLIC PANEL WITH GFR
Anion gap: 7 (ref 5–15)
BUN: 28 mg/dL — ABNORMAL HIGH (ref 8–23)
CO2: 33 mmol/L — ABNORMAL HIGH (ref 22–32)
Calcium: 8.3 mg/dL — ABNORMAL LOW (ref 8.9–10.3)
Chloride: 94 mmol/L — ABNORMAL LOW (ref 98–111)
Creatinine, Ser: 1.62 mg/dL — ABNORMAL HIGH (ref 0.44–1.00)
GFR, Estimated: 31 mL/min — ABNORMAL LOW (ref >60)
Glucose, Bld: 123 mg/dL — ABNORMAL HIGH (ref 70–99)
Potassium: 3.3 mmol/L — ABNORMAL LOW (ref 3.5–5.1)
Sodium: 134 mmol/L — ABNORMAL LOW (ref 135–145)

## 2023-05-17 LAB — CBC
HCT: 23.8 % — ABNORMAL LOW (ref 36.0–46.0)
Hemoglobin: 7.4 g/dL — ABNORMAL LOW (ref 12.0–15.0)
MCH: 28.7 pg (ref 26.0–34.0)
MCHC: 31.1 g/dL (ref 30.0–36.0)
MCV: 92.2 fL (ref 80.0–100.0)
Platelets: 142 10*3/uL — ABNORMAL LOW (ref 150–400)
RBC: 2.58 MIL/uL — ABNORMAL LOW (ref 3.87–5.11)
RDW: 15.8 % — ABNORMAL HIGH (ref 11.5–15.5)
WBC: 7 10*3/uL (ref 4.0–10.5)
nRBC: 0 % (ref 0.0–0.2)

## 2023-05-17 LAB — GLUCOSE, CAPILLARY
Glucose-Capillary: 109 mg/dL — ABNORMAL HIGH (ref 70–99)
Glucose-Capillary: 159 mg/dL — ABNORMAL HIGH (ref 70–99)
Glucose-Capillary: 129 mg/dL — ABNORMAL HIGH (ref 70–99)
Glucose-Capillary: 225 mg/dL — ABNORMAL HIGH (ref 70–99)
Glucose-Capillary: 204 mg/dL — ABNORMAL HIGH (ref 70–99)

## 2023-05-17 LAB — FOLATE: Folate: 7.5 ng/mL (ref >5.9)

## 2023-05-17 LAB — FERRITIN: Ferritin: 55 ng/mL (ref 11–307)

## 2023-05-17 LAB — MAGNESIUM: Magnesium: 2 mg/dL (ref 1.7–2.4)

## 2023-05-17 LAB — VITAMIN B12: Vitamin B-12: 1877 pg/mL — ABNORMAL HIGH (ref 180–914)

## 2023-05-17 MED ORDER — POTASSIUM CHLORIDE CRYS ER 20 MEQ PO TBCR
40.0000 meq | EXTENDED_RELEASE_TABLET | Freq: Once | ORAL | Status: AC
  Administered 2023-05-17: 40 meq via ORAL
  Filled 2023-05-17: qty 2

## 2023-05-17 MED ORDER — ENSURE ENLIVE PO LIQD
237.0000 mL | Freq: Two times a day (BID) | ORAL | Status: DC
  Administered 2023-05-18 – 2023-05-25 (×4): 237 mL via ORAL

## 2023-05-17 MED ORDER — LACTULOSE 10 GM/15ML PO SOLN
20.0000 g | Freq: Two times a day (BID) | ORAL | Status: AC
Start: 2023-05-17 — End: 2023-05-18
  Administered 2023-05-17 – 2023-05-18 (×2): 20 g via ORAL
  Filled 2023-05-17 (×2): qty 30

## 2023-05-17 MED ORDER — EMPAGLIFLOZIN 10 MG PO TABS: 10.0000 mg | ORAL_TABLET | Freq: Every day | ORAL | Status: DC

## 2023-05-17 MED ORDER — METHYLPREDNISOLONE SODIUM SUCC 40 MG IJ SOLR
40.0000 mg | Freq: Two times a day (BID) | INTRAMUSCULAR | Status: DC
  Administered 2023-05-17 – 2023-05-18 (×3): 40 mg via INTRAVENOUS
  Filled 2023-05-17 (×3): qty 1

## 2023-05-17 MED ORDER — INSULIN GLARGINE-YFGN 100 UNIT/ML ~~LOC~~ SOLN
10.0000 [IU] | Freq: Every day | SUBCUTANEOUS | Status: DC
  Administered 2023-05-17: 10 [IU] via SUBCUTANEOUS
  Filled 2023-05-17 (×2): qty 0.1

## 2023-05-17 NOTE — Progress Notes (Signed)
 Rounding Note    Patient Name: Mary Mccarthy Date of Encounter: 05/17/2023  [facility] Cardiologist: Bryan Lemma, MD  Chief Complaint:  Chief Complaint  Patient presents with   Shortness of Breath   Reason of consult: CHF  Subjective   Denies anginal chest pain. Shortness of breath improving but still requiring more than anticipated oxygen supplementation and coughing.  Inpatient Medications    Scheduled Meds:  amiodarone  200 mg Oral Daily   atorvastatin  40 mg Oral Daily   empagliflozin  10 mg Oral Daily   enoxaparin (LOVENOX) injection  30 mg Subcutaneous Q24H   fesoterodine  4 mg Oral Daily   furosemide  60 mg Intravenous BID   hydrALAZINE  25 mg Oral Q8H   insulin aspart  0-5 Units Subcutaneous QHS   insulin aspart  0-6 Units Subcutaneous TID WC   insulin glargine-yfgn  10 Units Subcutaneous Daily   ipratropium-albuterol  3 mL Nebulization QID   isosorbide mononitrate  30 mg Oral Daily   levothyroxine  25 mcg Oral Daily   methylPREDNISolone (SOLU-MEDROL) injection  40 mg Intravenous BID   metoprolol succinate  12.5 mg Oral Daily   mirtazapine  15 mg Oral QHS   pantoprazole  40 mg Oral BID   sodium chloride flush  3 mL Intravenous Q12H   Continuous Infusions:  PRN Meds: acetaminophen, ALPRAZolam, guaiFENesin-dextromethorphan, hydrALAZINE, ipratropium-albuterol, sodium chloride flush, trimethobenzamide   Vital Signs    Vitals:   05/17/23 0354 05/17/23 0413 05/17/23 0645 05/17/23 0749  BP: (!) 148/53  (!) 159/51 (!) 161/51  Pulse: 66   72  Resp: 20   (!) 24  Temp: 98.7 F (37.1 C)   98.6 F (37 C)  TempSrc: Oral   Oral  SpO2: 94%   94%  Weight:  86.7 kg    Height:        Intake/Output Summary (Last 24 hours) at 05/17/2023 1050 Last data filed at 05/17/2023 0357 Gross per 24 hour  Intake 100 ml  Output 1750 ml  Net -1650 ml      05/17/2023    4:13 AM 05/16/2023    6:00 AM 05/15/2023    5:04 AM  Last 3 Weights  Weight  (lbs) 191 lb 2.2 oz 187 lb 6.3 oz 194 lb 14.2 oz  Weight (kg) 86.7 kg 85 kg 88.4 kg      Telemetry    SR - Personally Reviewed  ECG    No new tracing - Personally Reviewed  Physical Exam   General: Age-appropriate female, hemodynamically stable, on nasal cannula oxygen HEENT: Normocephalic, atraumatic, dry mucous membranes, trachea midline, -JVP Lungs: Good inspiratory effort, bilateral crackles and rhonchi, no expiratory wheezing. Heart: Regular, positive S1-S2, soft holosystolic murmur Abdomen: Soft, nontender, nondistended, positive bowel sounds in all 4 quadrants Extremities: Warm to touch, 1+ bilateral DP pulses, lower extremity swelling up to the mid thigh (iliotibial band)-improving Neuro: Alert oriented x 4, moves all 4 extremities, strength 3+ in all 4 extremities Psych: Normal affect  Labs    High Sensitivity Troponin:   Recent Labs  Lab 05/14/23 0922 05/14/23 1340  TROPONINIHS 35* 31*     Chemistry Recent Labs  Lab 05/14/23 0922 05/15/23 0301 05/16/23 0258 05/17/23 0320 05/17/23 0836  NA 135 135 134* 134*  --   K 4.1 3.9 4.1 3.3*  --   CL 100 98 96* 94*  --   CO2 25 23 27  33*  --   GLUCOSE 109* 158*  152* 123*  --   BUN 24* 30* 35* 28*  --   CREATININE 1.98* 1.78* 1.92* 1.62*  --   CALCIUM 9.4 8.9 9.1 8.3*  --   MG  --   --  1.4*  --  2.0  PROT 7.8  --   --   --   --   ALBUMIN 3.2*  --   --   --   --   AST 58*  --   --   --   --   ALT 28  --   --   --   --   ALKPHOS 48  --   --   --   --   BILITOT 0.9  --   --   --   --   GFRNONAA 24* 28* 25* 31*  --   ANIONGAP 10 14 11 7   --     Lipids No results for input(s): "CHOL", "TRIG", "HDL", "LABVLDL", "LDLCALC", "CHOLHDL" in the last 168 hours.  Hematology Recent Labs  Lab 05/14/23 0922 05/17/23 0320  WBC 5.2 7.0  RBC 2.78* 2.58*  2.58*  HGB 8.1* 7.4*  HCT 27.1* 23.8*  MCV 97.5 92.2  MCH 29.1 28.7  MCHC 29.9* 31.1  RDW 15.7* 15.8*  PLT 223 142*   Thyroid  Recent Labs  Lab 05/14/23 1340   TSH 5.654*    BNP Recent Labs  Lab 05/14/23 0922  BNP 977.8*    DDimer No results for input(s): "DDIMER" in the last 168 hours.   Radiology    NA  Cardiac Studies   Echo  11/2022: LVEF 60 to 65%, grade 2 diastolic dysfunction, severely dilated left atrium, severe MAC, 26 mm Medtronic stent and TAVR valve present with paravalvular leak  05/2023:  1. Left ventricular ejection fraction, by estimation, is 60 to 65%. The  left ventricle has normal function. The left ventricle has no regional  wall motion abnormalities. There is severe asymmetric left ventricular  hypertrophy of the septal segment. Left   ventricular diastolic function could not be evaluated.   2. Right ventricular systolic function is normal. The right ventricular  size is mildly enlarged. There is moderately elevated pulmonary artery  systolic pressure. The estimated right ventricular systolic pressure is  53.7 mmHg.   3. Left atrial size was mildly dilated.   4. Right atrial size was mildly dilated.   5. The mitral valve is degenerative. Trivial mitral valve regurgitation.  Mild mitral stenosis. The mean mitral valve gradient is 3.0 mmHg. Severe  mitral annular calcification.   6. Tricuspid valve regurgitation is mild to moderate.   7. Mild paravalvular leak adjacent to the intervalvular fibrosa. The  aortic valve has been repaired/replaced. Aortic valve regurgitation is  mild. No aortic stenosis is present. There is a 26 mm Medtronic stented  (TAVR) valve present in the aortic  position. Procedure Date: 10/26/2020. Echo findings are consistent with  normal structure and function of the aortic valve prosthesis. Echo  findings are consistent with perivalvular leak of the aortic prosthesis.  Aortic regurgitation PHT measures 514 msec.   Aortic valve mean gradient measures 9.0 mmHg. Aortic valve Vmax measures  1.88 m/s.   8. The inferior vena cava is dilated in size with <50% respiratory  variability,  suggesting right atrial pressure of 15 mmHg.   Comparison(s): Prior images reviewed side by side. PVL is more prominent,  stable RVSP.    RHC/LHC 09/2020, performed by Dr. Clifton James   Prox RCA lesion is  20% stenosed.   Mid RCA to Dist RCA lesion is 20% stenosed.   [place]amus lesion is 30% stenosed.   Mid LAD lesion is 20% stenosed.   Dist LM to Prox LAD lesion is 20% stenosed.   Mild non-obstructive CAD Severe aortic stenosis (mean gradient 52.8 mmHg, peak to peak gradient 58 mmHg, AVA 0.90 cm2).   Patient Profile     83 y.o. female with a hx of HTN, HLD, chronic respiratory failure on 2 L oxygen at baseline,chronic diastolic heart failure, persistent A. Fib not on chronic AC due to recurrent GI bleed, history of PUD/AVM, history of CVA, severe AS s/p TAVR (26 mm Medtronic Evolut Pro+ THV via TF approach in 10/2020) complicated by transient complete heart block, type 2 diabetes, mild CAD by pre-TAVR cath 09/2020, hypothyroidism, GERD, anemia previously requiring transfusions who is being seen 05/15/2023 for the evaluation of acute on chronic diastolic heart failure at the request of Dr. Ella Jubilee.   Assessment & Plan    Impression:  Acute exacerbation of heart failure with preserved EF. Persistent atrial fibrillation, currently sinus. Long-term antiarrhythmic medications. Dyspnea: Multifactorial Mild nonobstructive CAD. Elevated troponins, demand ischemia. 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 Severe aortic stenosis status post TAVR 10/2020   Recommendations:  Acute exacerbation of heart failure with preserved EF. -Improving.   -Weight at the last hospital discharge in January 2025, 185 pounds.   -Weight on admission 194 pounds.   -Increase IV Lasix 60 mg tid  -Bilateral compression stockings to help mobilize fluid -Monitor BUN and creatinine -Continue hydralazine and isosorbide mononitrate combination. -Continue Imdur 30 mg po  qday. -Not a candidate for SGLT2 inhibitors at this time, patient informs me that she has had at least 3 urinary tract infections in the last 1.5 months. -Continue home dose beta-blocker therapy -Strict I's and O's and daily weights -Continue telemetry - Net IO Since Admission: -4,094 mL [05/17/23 1050]   Persistent atrial fibrillation, currently sinus. Long-term antiarrhythmic medications. Currently in sinus rhythm. Rate control Toprol-XL. Rhythm control: Amiodarone. Thromboembolic prophylaxis N/A due to prior bleeding and has been evaluated for watchman as well, see prior progress notes for details. Patient is aware that not being on anticoagulation does predispose her to thromboembolic events and should be cognizant for symptoms and if present should come to the closest ER via EMS for further evaluation and management  Dyspnea:  Multifactorial  Heart failure management as discussed above. Atrial fibrillation management as discussed above. Defer to PCP with regards to workup for pneumonia versus other etiologies given her lung examination findings. Nurse informs me that she is scheduled to receive steroids today. Recommend incentive spirometer.   Mild nonobstructive CAD. Denies anginal chest pain. EKG is nonischemic. Monitor for now   Elevated troponins. Demand ischemia in the setting of hypertensive urgency on arrival as well as acute decompensated heart failure.   Hyperlipidemia Continue current medical therapy   Hypertension with chronic kidney disease stage III/IV and congestive heart failure. Currently on isosorbide mononitrate and hydralazine. Would like to avoid ARB's, Arni, ACE inhibitors, MRA's, to help facilitate diuresis.   SGLT2i - CI due to 3 UTI over the last 1.5 months, may revisit later.  Going up on IV lasix today  Likely start ARB tomorrow - based on renal function (BL Cr 1.4-1.5) - if able to tolerate low dose Entresto can be consider either prior to d/c or  at follow up    Severe aortic stenosis status post TAVR  10/2020:  Stable, will await repeat echo, antibiotic prophylaxis prior to dental procedures.  Independently reviewed:  Telemetry  Labs 05/17/2023 Progress notes since yesterday.    Prescription drug management: Increased lasix    Coordination of care: Updated patient and attending physician     For questions or updates, please contact [facility] Please consult [email or web address] for contact info under     Signed, Tessa Lerner, DO, [facility]  [facility]  [address] [place], [place] [address] Pager: [phone] Office: [phone] 05/17/2023, 10:50 AM

## 2023-05-17 NOTE — Care Management Important Message (Signed)
 Important Message  Patient Details  Name: Mary Mccarthy MRN: [id number] Date of Birth: [birth year]   Important Message Given:  Yes - Medicare IM     Renie Ora 05/17/2023, 11:21 AM

## 2023-05-17 NOTE — Plan of Care (Signed)
  Problem: Education: Goal: Knowledge of General Education information will improve Description: Including pain rating scale, medication(s)/side effects and non-pharmacologic comfort measures Outcome: Progressing   Problem: Activity: Goal: Risk for activity intolerance will decrease Outcome: Progressing   Problem: Coping: Goal: Level of anxiety will decrease Outcome: Progressing   Problem: Safety: Goal: Ability to remain free from injury will improve Outcome: Progressing   Problem: Health Behavior/Discharge Planning: Goal: Ability to manage health-related needs will improve Outcome: Not Progressing   Problem: Clinical Measurements: Goal: Ability to maintain clinical measurements within normal limits will improve Outcome: Not Progressing Goal: Respiratory complications will improve Outcome: Not Progressing

## 2023-05-17 NOTE — Progress Notes (Signed)
 RT NOTE: PT says does not wear CPAP at home and does not wish to wear CPAP tonight. RT told PT to call if she changes her mind and wants to try CPAP later.

## 2023-05-17 NOTE — Plan of Care (Signed)

## 2023-05-17 NOTE — Progress Notes (Signed)
 Physical Therapy Treatment Patient Details Name: Mary Mccarthy MRN: [id number] DOB: 1940-12-19 Today's Date: 05/17/2023   History of Present Illness Patient is a 83 year old female with acute on chronic respiratory failure with hypoxia secondary to acute on chronic diastolic CHF. History of HTN, hyperlipidemia, respiratory failure on 2 L, PAF, GI bleed, aortic stenosis s/p TAVR,  transient heart block, diabetes mellitus type II, CAD, hypothyroidism, GERD    PT Comments  Pt greeted supine in bed, pleasant and agreeable to PT session. Attempted to titrate pt from 6L to 4L, which she tolerated at rest and with bed mobility but desaturated to 81% SpO2 during gait. She recovered to 89% SpO2 following bump up to 6L, seated rest, and cues for PLB technique. Pt has reduced cardiopulmonary endurance and continues to require increased supplemental oxygen compared to baseline. She completed two bouts of short distance gait using RW with CGA. Will continue to follow acutely and advance appropriately.      If plan is discharge home, recommend the following: Assist for transportation;Help with stairs or ramp for entrance;Assistance with cooking/housework;A little help with walking and/or transfers;A little help with bathing/dressing/bathroom   Can travel by private vehicle        Equipment Recommendations  None recommended by PT    Recommendations for Other Services       Precautions / Restrictions Precautions Precautions: Fall Recall of Precautions/Restrictions: Intact Precaution/Restrictions Comments: watch SpO2 Restrictions Weight Bearing Restrictions Per Provider Order: No     Mobility  Bed Mobility Overal bed mobility: Needs Assistance Bed Mobility: Supine to Sit, Sit to Supine     Supine to sit: HOB elevated, Mod assist, Min assist Sit to supine: Min assist, HOB elevated   General bed mobility comments: Pt sat up on L side of bed with increased time. VC for sequencing. She  brought BLE off EOB with minA, trunk upright with minA, and required modA using bed pad to soot fwd til feet flat. Returning to bed pt required minA to bring BLE back into bed. Repositioned using bed features and bed pad, +2 assist.    Transfers Overall transfer level: Needs assistance Equipment used: Rolling walker (2 wheels) Transfers: Sit to/from Stand Sit to Stand: Contact guard assist           General transfer comment: Pt stood from lowest bed height and chair. VC for proper hand positioning using RW. She powered up with CGA maintaining a kyphotic posture. Good eccentric control with sitting.    Ambulation/Gait Ambulation/Gait assistance: Contact guard assist Gait Distance (Feet): 10 Feet (x2, seated rest in between bouts) Assistive device: Rolling walker (2 wheels) Gait Pattern/deviations: Step-through pattern, Trunk flexed, Decreased stride length Gait velocity: decreased Gait velocity interpretation: <1.31 ft/sec, indicative of household ambulator   General Gait Details: Pt ambulated with short slow steps. She maintained body inside the RW at all times and kept a flex posture. No LOB or unsteadiness noted. Pt c/o "her legs feeling like puddy." Facilitated a seated rest break and cued PLB technique. Pt side stepped to the L along EOB to reach [facility]. She achieved good foot clearence and weight shift.   Stairs             Wheelchair Mobility     Tilt Bed    Modified Rankin (Stroke Patients Only)       Balance Overall balance assessment: Needs assistance Sitting-balance support: Feet supported Sitting balance-[name]: Good     Standing balance support: Bilateral upper extremity  supported, During functional activity, Reliant on assistive device for balance Standing balance-[name]: Poor Standing balance comment: Pt dependent on RW for stability.                            Communication Communication Communication: Impaired Factors  Affecting Communication: Reduced clarity of speech (Pt speaks in low hushed tones)  Cognition Arousal: Alert Behavior During Therapy: WFL for tasks assessed/performed   PT - Cognitive impairments: No apparent impairments                         Following commands: Intact      Cueing Cueing Techniques: Verbal cues  Exercises      General Comments General comments (skin integrity, edema, etc.): Pt greeted on 6L O2, titrated to 4L and SpO2 91-94% at rest and during bed mobility. Pt desaturated to 81% during gait, had pt sit to rest, bumped up to 6L, and cued PLB. She recovered to 89% SpO2. Pt reported DOE 2/4.      Pertinent Vitals/Pain Pain Assessment Pain Assessment: No/denies pain    Home Living                          Prior Function            PT Goals (current goals can now be found in the care plan section) Acute Rehab PT Goals Patient Stated Goal: Breath easier and move better Progress towards PT goals: Progressing toward goals    Frequency    Min 2X/week      PT Plan      Co-evaluation              AM-PAC PT "6 Clicks" Mobility   Outcome Measure  Help needed turning from your back to your side while in a flat bed without using bedrails?: A Little Help needed moving from lying on your back to sitting on the side of a flat bed without using bedrails?: A Little Help needed moving to and from a bed to a chair (including a wheelchair)?: A Little Help needed standing up from a chair using your arms (e.g., wheelchair or bedside chair)?: A Little Help needed to walk in hospital room?: A Lot Help needed climbing 3-5 steps with a railing? : A Lot 6 Click Score: 16    End of Session Equipment Utilized During Treatment: Gait belt;Oxygen Activity Tolerance: Patient tolerated treatment well Patient left: in bed;with call bell/phone within reach;with bed alarm set Nurse Communication: Mobility status;Other (comment) (desaturation) PT  Visit Diagnosis: Muscle weakness (generalized) (M62.81);Unsteadiness on feet (R26.81)     Time: 1610-9604 PT Time Calculation (min) (ACUTE ONLY): 30 min  Charges:    $Gait Training: 8-22 mins $Therapeutic Activity: 8-22 mins PT General Charges $$ ACUTE PT VISIT: 1 Visit                     Cheri Guppy, PT, DPT Acute Rehabilitation Services Office: [phone] Secure Chat Preferred  Richardson Chiquito 05/17/2023, 5:00 PM

## 2023-05-17 NOTE — Progress Notes (Addendum)
 PROGRESS NOTE    Mary Mccarthy  MWU:[id number] DOB: [birth year] DOA: 05/14/2023 PCP: Corwin Levins, MD  83/F w hypertension, hyperlipidemia, paroxysmal atrial fibrillation, aortic stenosis sp TAVR, T2DM and obesity who presented with dyspnea and lower extremity edema. In the ED, Hypoxic and Vol overloaded, cr 1,98  BNP 977, troponin 35 and 31 , CXR wpositive cardiomegaly, bilateral hilar vascular congestion with cephalization of the vasculature.   Subjective: Still with ongoing shortness of breath and cough, feeling a little better overall, on 6 L O2 this morning  Assessment and Plan:  Acute on chronic diastolic CHF  Pulmonary hypertension Acute hypoxic respiratory failure -Echo now with EF 60-65%, normal RV, moderately elevated PASP, TAVR -Remains volume overloaded, 4 L negative, continue IV Lasix today, creatinine stabilizing, continue hydralazine, Imdur, metoprolol -Poor candidate for SGLT2i-history of multiple UTIs -GDMT limited by CKD -Increase activity, wean O2  Suspected COPD with mild exacerbation Bronchitis -Exposed to secondhand smoke for decades -Diffuse scattered rhonchi, productive cough -Continue DuoNebs, add IV Solu-Medrol X2 doses -Flutter valve, Mucinex  Iron deficiency anemia -Hemoglobin down to 7.4, she denies overt blood loss -Start IV iron  Persistent atrial fibrillation (HCC) Continue amiodarone and metoprolol.  Declines anticoagulation  Coronary artery disease, non-occlusive Stable, continue statin  SP TAVR 9/22  Hypertensive urgency Continue metoprolol, hydralazine and isosorbide.   CKD (chronic kidney disease), stage IV (HCC) -Now improving, monitor with diuresis  Type 2 diabetes mellitus with hyperlipidemia (HCC) -Add Semglee, starting IV steroids today  Anxiety with depression Continue mirtazapine and alprazolam.   Hypothyroidism Continue levothyroxine    DVT prophylaxis: lovenox Code Status: Full Code Family  Communication: None present Disposition Plan: Home pending improvement  Consultants:   :    Objective: Vitals:   05/17/23 0354 05/17/23 0413 05/17/23 0645 05/17/23 0749  BP: (!) 148/53  (!) 159/51 (!) 161/51  Pulse: 66   72  Resp: 20   (!) 24  Temp: 98.7 F (37.1 C)   98.6 F (37 C)  TempSrc: Oral   Oral  SpO2: 94%   94%  Weight:  86.7 kg    Height:        Intake/Output Summary (Last 24 hours) at 05/17/2023 1140 Last data filed at 05/17/2023 0357 Gross per 24 hour  Intake 100 ml  Output 1750 ml  Net -1650 ml   Filed Weights   05/15/23 0504 05/16/23 0600 05/17/23 0413  Weight: 88.4 kg 85 kg 86.7 kg    Examination:  Gen: Awake, Alert, Oriented X 3,  HEENT: Positive JVD Lungs: Diffuse scattered rhonchi CVS: S1S2/RRR Abd: soft, Non tender, non distended, BS present Extremities: 1+ edema, Skin: no new rashes on exposed skin     Data Reviewed:   CBC: Recent Labs  Lab 05/14/23 0922 05/17/23 0320  WBC 5.2 7.0  NEUTROABS 3.9  --   HGB 8.1* 7.4*  HCT 27.1* 23.8*  MCV 97.5 92.2  PLT 223 142*   Basic Metabolic Panel: Recent Labs  Lab 05/14/23 0922 05/15/23 0301 05/16/23 0258 05/17/23 0320 05/17/23 0836  NA 135 135 134* 134*  --   K 4.1 3.9 4.1 3.3*  --   CL 100 98 96* 94*  --   CO2 25 23 27  33*  --   GLUCOSE 109* 158* 152* 123*  --   BUN 24* 30* 35* 28*  --   CREATININE 1.98* 1.78* 1.92* 1.62*  --   CALCIUM 9.4 8.9 9.1 8.3*  --   MG  --   --  1.4*  --  2.0   GFR: Estimated Creatinine Clearance: 25.9 mL/min (A) (by C-G formula based on SCr of 1.62 mg/dL (H)). Liver Function Tests: Recent Labs  Lab 05/14/23 0922  AST 58*  ALT 28  ALKPHOS 48  BILITOT 0.9  PROT 7.8  ALBUMIN 3.2*   Recent Labs  Lab 05/14/23 0922  LIPASE 19   No results for input(s): "AMMONIA" in the last 168 hours. Coagulation Profile: No results for input(s): "INR", "PROTIME" in the last 168 hours. Cardiac Enzymes: No results for input(s): "CKTOTAL", "CKMB",  "CKMBINDEX", "TROPONINI" in the last 168 hours. BNP (last 3 results) Recent Labs    03/14/23 0924  PROBNP 235.0*   HbA1C: No results for input(s): "HGBA1C" in the last 72 hours. CBG: Recent Labs  Lab 05/16/23 1209 05/16/23 1546 05/16/23 2153 05/17/23 0612 05/17/23 1032  GLUCAP 156* 146* 152* 109* 159*   Lipid Profile: No results for input(s): "CHOL", "HDL", "LDLCALC", "TRIG", "CHOLHDL", "LDLDIRECT" in the last 72 hours. Thyroid Function Tests: Recent Labs    05/14/23 1340  TSH 5.654*   Anemia Panel: Recent Labs    05/17/23 0320  VITAMINB12 1,877*  FOLATE 7.5  FERRITIN 55  TIBC 293  IRON 17*  RETICCTPCT 1.4   Urine analysis:    Component Value Date/Time   COLORURINE YELLOW 05/14/2023 1148   APPEARANCEUR TURBID (A) 05/14/2023 1148   LABSPEC 1.014 05/14/2023 1148   PHURINE 5.0 05/14/2023 1148   GLUCOSEU NEGATIVE 05/14/2023 1148   GLUCOSEU NEGATIVE 03/15/2022 1217   HGBUR NEGATIVE 05/14/2023 1148   BILIRUBINUR NEGATIVE 05/14/2023 1148   KETONESUR NEGATIVE 05/14/2023 1148   PROTEINUR 30 (A) 05/14/2023 1148   UROBILINOGEN 0.2 03/15/2022 1217   NITRITE NEGATIVE 05/14/2023 1148   LEUKOCYTESUR NEGATIVE 05/14/2023 1148   Sepsis Labs: @LABRCNTIP (procalcitonin:4,lacticidven:4)  ) Recent Results (from the past 240 hours)  Resp panel by RT-PCR (RSV, Flu A&B, Covid) Anterior Nasal Swab     Status: None   Collection Time: 05/14/23  9:31 AM   Specimen: Anterior Nasal Swab  Result Value Ref Range Status   SARS Coronavirus 2 by RT PCR NEGATIVE NEGATIVE Final   Influenza A by PCR NEGATIVE NEGATIVE Final   Influenza B by PCR NEGATIVE NEGATIVE Final    Comment: (NOTE) The Xpert Xpress SARS-CoV-2/FLU/RSV plus assay is intended as an aid in the diagnosis of influenza from Nasopharyngeal swab specimens and should not be used as a sole basis for treatment. Nasal washings and aspirates are unacceptable for Xpert Xpress SARS-CoV-2/FLU/RSV testing.  Fact Sheet for  Patients: BloggerCourse.com  Fact Sheet for Healthcare Providers: SeriousBroker.it  This test is not yet approved or cleared by the [place] FDA and has been authorized for detection and/or diagnosis of SARS-CoV-2 by FDA under an Emergency Use Authorization (EUA). This EUA will remain in effect (meaning this test can be used) for the duration of the COVID-19 declaration under Section 564(b)(1) of the Act, 21 U.S.C. section 360bbb-3(b)(1), unless the authorization is terminated or revoked.     Resp Syncytial Virus by PCR NEGATIVE NEGATIVE Final    Comment: (NOTE) Fact Sheet for Patients: BloggerCourse.com  Fact Sheet for Healthcare Providers: SeriousBroker.it  This test is not yet approved or cleared by the [place] FDA and has been authorized for detection and/or diagnosis of SARS-CoV-2 by FDA under an Emergency Use Authorization (EUA). This EUA will remain in effect (meaning this test can be used) for the duration of the COVID-19 declaration under Section 564(b)(1) of the Act, 21  U.S.C. section 360bbb-3(b)(1), unless the authorization is terminated or revoked.  Performed at [facility], [address], [place], [place] [address]      Radiology Studies: ECHOCARDIOGRAM COMPLETE Result Date: 05/15/2023    ECHOCARDIOGRAM REPORT   Patient Name:   GRACEANN BOILEAU Date of Exam: 05/15/2023 Medical Rec #:  [id number]                Height:       61.0 in Accession #:    [id number]               Weight:       194.9 lb Date of Birth:  1940-06-13                BSA:          1.868 m Patient Age:    82 years                 BP:           155/126 mmHg Patient Gender: F                        HR:           61 bpm. Exam Location:  Inpatient Procedure: 2D Echo, Cardiac Doppler and Color Doppler (Both Spectral and Color            Flow Doppler were utilized during procedure).  Indications:    I50.40* Unspecified combined systolic (congestive) and diastolic                 (congestive) heart failure  History:        Patient has prior history of Echocardiogram examinations, most                 recent 11/13/2022. CHF, CAD, Stroke, Aortic Valve Disease; Risk                 Factors:Hypertension, Dyslipidemia and Diabetes. TAVR procedure.                 Aortic Valve: 26 mm Medtronic stented (TAVR) valve is present in                 the aortic position. Procedure Date: 10/26/2020.  Sonographer:    Sheralyn Boatman RDCS Referring Phys: [id number] RONDELL A SMITH  Sonographer Comments: Study delayed, patient in chair IMPRESSIONS  1. Left ventricular ejection fraction, by estimation, is 60 to 65%. The left ventricle has normal function. The left ventricle has no regional wall motion abnormalities. There is severe asymmetric left ventricular hypertrophy of the septal segment. Left  ventricular diastolic function could not be evaluated.  2. Right ventricular systolic function is normal. The right ventricular size is mildly enlarged. There is moderately elevated pulmonary artery systolic pressure. The estimated right ventricular systolic pressure is 53.7 mmHg.  3. Left atrial size was mildly dilated.  4. Right atrial size was mildly dilated.  5. The mitral valve is degenerative. Trivial mitral valve regurgitation. Mild mitral stenosis. The mean mitral valve gradient is 3.0 mmHg. Severe mitral annular calcification.  6. Tricuspid valve regurgitation is mild to moderate.  7. Mild paravalvular leak adjacent to the intervalvular fibrosa. The aortic valve has been repaired/replaced. Aortic valve regurgitation is mild. No aortic stenosis is present. There is a 26 mm Medtronic stented (TAVR) valve present in the aortic position. Procedure Date: 10/26/2020. Echo findings are consistent with normal structure and  function of the aortic valve prosthesis. Echo findings are consistent with perivalvular leak of the aortic  prosthesis. Aortic regurgitation PHT measures 514 msec.  Aortic valve mean gradient measures 9.0 mmHg. Aortic valve Vmax measures 1.88 m/s.  8. The inferior vena cava is dilated in size with <50% respiratory variability, suggesting right atrial pressure of 15 mmHg. Comparison(s): Prior images reviewed side by side. PVL is more prominent, stable RVSP. FINDINGS  Left Ventricle: Left ventricular ejection fraction, by estimation, is 60 to 65%. The left ventricle has normal function. The left ventricle has no regional wall motion abnormalities. Strain was performed and the global longitudinal strain is indeterminate. The left ventricular internal cavity size was normal in size. There is severe asymmetric left ventricular hypertrophy of the septal segment. Left ventricular diastolic function could not be evaluated due to mitral annular calcification (moderate or greater). Left ventricular diastolic function could not be evaluated. Right Ventricle: The right ventricular size is mildly enlarged. No increase in right ventricular wall thickness. Right ventricular systolic function is normal. There is moderately elevated pulmonary artery systolic pressure. The tricuspid regurgitant velocity is 3.11 m/s, and with an assumed right atrial pressure of 15 mmHg, the estimated right ventricular systolic pressure is 53.7 mmHg. Left Atrium: Left atrial size was mildly dilated. Right Atrium: Right atrial size was mildly dilated. Pericardium: There is no evidence of pericardial effusion. Mitral Valve: The mitral valve is degenerative in appearance. Severe mitral annular calcification. Trivial mitral valve regurgitation. Mild mitral valve stenosis. MV peak gradient, 11.8 mmHg. The mean mitral valve gradient is 3.0 mmHg with average heart rate of 59 bpm. Tricuspid Valve: The tricuspid valve is normal in structure. Tricuspid valve regurgitation is mild to moderate. No evidence of tricuspid stenosis. Aortic Valve: Mild paravalvular leak  adjacent to the intervalvular fibrosa. The aortic valve has been repaired/replaced. Aortic valve regurgitation is mild. Aortic regurgitation PHT measures 514 msec. No aortic stenosis is present. Aortic valve mean gradient measures 9.0 mmHg. Aortic valve peak gradient measures 14.1 mmHg. Aortic valve area, by VTI measures 1.92 cm. There is a 26 mm Medtronic stented (TAVR) valve present in the aortic position. Procedure Date: 10/26/2020. Echo findings are consistent with normal structure and function of the aortic valve prosthesis. Pulmonic Valve: The pulmonic valve was normal in structure. Pulmonic valve regurgitation is not visualized. No evidence of pulmonic stenosis. Aorta: The aortic root and ascending aorta are structurally normal, with no evidence of dilitation. Venous: The inferior vena cava is dilated in size with less than 50% respiratory variability, suggesting right atrial pressure of 15 mmHg. IAS/Shunts: The atrial septum is grossly normal. Additional Comments: 3D was performed not requiring image post processing on an independent workstation and was indeterminate.  LEFT VENTRICLE PLAX 2D LVIDd:         4.60 cm     Diastology LVIDs:         2.90 cm     LV e' medial:    5.55 cm/s LV PW:         1.10 cm     LV E/e' medial:  25.9 LV IVS:        1.50 cm     LV e' lateral:   6.64 cm/s LVOT diam:     2.00 cm     LV E/e' lateral: 21.7 LV SV:         81 LV SV Index:   43 LVOT Area:     3.14 cm  LV Volumes (MOD) LV vol d,  MOD A2C: 84.9 ml LV vol d, MOD A4C: 68.3 ml LV vol s, MOD A2C: 27.4 ml LV vol s, MOD A4C: 31.5 ml LV SV MOD A2C:     57.5 ml LV SV MOD A4C:     68.3 ml LV SV MOD BP:      46.8 ml RIGHT VENTRICLE             IVC RV S prime:     10.90 cm/s  IVC diam: 2.70 cm TAPSE (M-mode): 2.2 cm LEFT ATRIUM             Index        RIGHT ATRIUM           Index LA diam:        5.20 cm 2.78 cm/m   RA Area:     21.80 cm LA Vol (A2C):   54.6 ml 29.23 ml/m  RA Volume:   64.90 ml  34.74 ml/m LA Vol (A4C):   62.4  ml 33.40 ml/m LA Biplane Vol: 59.4 ml 31.80 ml/m  AORTIC VALVE AV Area (Vmax):    1.93 cm AV Area (Vmean):   1.83 cm AV Area (VTI):     1.92 cm AV Vmax:           187.67 cm/s AV Vmean:          122.000 cm/s AV VTI:            0.420 m AV Peak Grad:      14.1 mmHg AV Mean Grad:      9.0 mmHg LVOT Vmax:         115.00 cm/s LVOT Vmean:        71.000 cm/s LVOT VTI:          0.257 m LVOT/AV VTI ratio: 0.61 AI PHT:            514 msec  AORTA Ao Root diam: 3.30 cm Ao Asc diam:  3.25 cm MITRAL VALVE                TRICUSPID VALVE MV Area (PHT): 4.60 cm     TR Peak grad:   38.7 mmHg MV Area VTI:   1.66 cm     TR Vmax:        311.00 cm/s MV Peak grad:  11.8 mmHg MV Mean grad:  3.0 mmHg     SHUNTS MV Vmax:       1.72 m/s     Systemic VTI:  0.26 m MV Vmean:      74.7 cm/s    Systemic Diam: 2.00 cm MV Decel Time: 165 msec MV E velocity: 144.00 cm/s MV A velocity: 71.80 cm/s MV E/A ratio:  2.01 Riley Lam MD Electronically signed by Riley Lam MD Signature Date/Time: 05/15/2023/5:44:08 PM    Final      Scheduled Meds:  amiodarone  200 mg Oral Daily   atorvastatin  40 mg Oral Daily   empagliflozin  10 mg Oral Daily   enoxaparin (LOVENOX) injection  30 mg Subcutaneous Q24H   fesoterodine  4 mg Oral Daily   furosemide  60 mg Intravenous BID   hydrALAZINE  25 mg Oral Q8H   insulin aspart  0-5 Units Subcutaneous QHS   insulin aspart  0-6 Units Subcutaneous TID WC   insulin glargine-yfgn  10 Units Subcutaneous Daily   ipratropium-albuterol  3 mL Nebulization QID   isosorbide mononitrate  30 mg Oral Daily   levothyroxine  25  mcg Oral Daily   methylPREDNISolone (SOLU-MEDROL) injection  40 mg Intravenous BID   metoprolol succinate  12.5 mg Oral Daily   mirtazapine  15 mg Oral QHS   pantoprazole  40 mg Oral BID   potassium chloride  40 mEq Oral Once   sodium chloride flush  3 mL Intravenous Q12H   Continuous Infusions:   LOS: 3 days    Time spent:    Zannie Cove, MD [facility]   05/17/2023, 11:40 AM

## 2023-05-17 NOTE — Progress Notes (Signed)
 Heart Failure Nurse Navigator Progress Note  PCP: Corwin Levins, MD PCP-Cardiologist: Herbie Baltimore Admission Diagnosis: Hypoxia, Shortness of breath, Hypervolemia.  Admitted from: Home  Presentation:   Mary Mccarthy presented with worsening shortness of breath, and leg swelling for last few days, wears 2 L oxygen at baseline, BP 181/56, HR 64, BNP 977.8, Troponin 35, CXR with pulmonary vascular congestion and edema with out consolidation or pneumonia. Urinalysis negative for UTI.   Patient was educated on the sing and symptoms of heart failure, daily weights, when to call her doctor or go to the ED, Diet/ fluid restrictions, taking all medications as prescribed and attending all medical appointments. Patient verbalized her understanding of all education, a HF TOC appointment was scheduled for 05/25/2023 @ 11:45 am per Dr. Jomarie Longs.   ECHO/ LVEF: 60-65%  Clinical Course:  Past Medical History:  Diagnosis Date   ALLERGIC RHINITIS 06/24/2007   Anemia    AVM (arteriovenous malformation) of colon    Blood transfusion without reported diagnosis 05/2015   COLONIC POLYPS, HX OF 06/24/2007   Coronary artery disease, non-occlusive 05/2015   Trop + w/ Acute Anemia =>CATH: small RI - Ostial 60%, ostial RCA 30% and dLAD 40-50%;; 9/'21: Cor Ca Score 624. Mild (25-49%) prox RCA & LAD,; Moderate (50-69%) Ostial Small RI & prox LCx.     DEPRESSION 10/08/2006   DIABETES MELLITUS, TYPE II 10/05/2006   GERD 06/24/2007   History of CVA (cerebrovascular accident) 08/10/2020   08/2020- found to be in afib with RVR, started on Eliquis (shower of emboli from A. fib as complication of severe AS)   HYPERLIPIDEMIA 10/08/2006   HYPERTENSION 10/08/2006   INSOMNIA 09/24/2008   Left knee DJD    Nonsustained paroxysmal ventricular tachycardia (HCC) 10/2018   Post-TAVR Zio Patch: Frequent (206) runs of NSVT - Fastest 5 beats 239 bpm. Longest 9.3 Sec avg 135 bpm. (? possibly Afib w/ aberrancy)   Osteoporosis  03/10/2016   PAF (paroxysmal atrial fibrillation) (HCC)    PAF with RVR: CHA2DS2-VASc 9.  On Eliquis and amiodarone. 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 Clifton James & Dr. Laneta Simmers   Severe aortic stenosis 08/2020   Progression from Mod-Severe AS to Severe AS - noted on Echo 08/2020 -> (mean gradient progressed from 34 to 42 mmHg) this was in setting of new onset A. fib RVR, acute diastolic HF and shower of emboli CVA. ->  Referred for TAVR, completed 10/26/2020     Social History   Socioeconomic History   Marital status: Widowed    Spouse name: Not on file   Number of children: 1   Years of education: Not on file   Highest education level: Not on file  Occupational History   Occupation: retired - Oceanographer: RETIRED  Tobacco Use   Smoking status: Never   Smokeless tobacco: Never  Vaping Use   Vaping status: Never Used  Substance and Sexual Activity   Alcohol use: No    Alcohol/week: 0.0 standard drinks of alcohol   Drug use: No   Sexual activity: Not Currently  Other Topics Concern   Not on file  Social History Narrative   Recently widowed--husband died in 12/12/18.      Now lives alone but her son usually stays with her at night.  During the day she  has 2 nephews who live nearby to come in and check on her intermittently.  Also one of her nieces calls routinely.      When her son is home and awake, she may try to walk on a treadmill, but is scared to walk outside.   Social Drivers of Corporate investment banker Strain: Low Risk  (05/11/2022)   Overall Financial Resource Strain (CARDIA)    Difficulty of Paying Living Expenses: Not hard at all  Food Insecurity: No Food Insecurity (05/14/2023)   Hunger Vital Sign    Worried About Running Out of Food in the Last Year: Never true    Ran Out of Food in the Last  Year: Never true  Recent Concern: Food Insecurity - Food Insecurity Present (03/01/2023)   Hunger Vital Sign    Worried About Running Out of Food in the Last Year: Sometimes true    Ran Out of Food in the Last Year: Never true  Transportation Needs: No Transportation Needs (05/14/2023)   PRAPARE - Administrator, Civil Service (Medical): No    Lack of Transportation (Non-Medical): No  Physical Activity: Inactive (05/11/2022)   Exercise Vital Sign    Days of Exercise per Week: 0 days    Minutes of Exercise per Session: 0 min  Stress: No Stress Concern Present (05/11/2022)   [employer] of Occupational Health - Occupational Stress Questionnaire    Feeling of Stress : Not at all  Social Connections: Socially Isolated (05/14/2023)   Social Connection and Isolation Panel [NHANES]    Frequency of Communication with Friends and Family: More than three times a week    Frequency of Social Gatherings with Friends and Family: Once a week    Attends Religious Services: Never    Database administrator or Organizations: No    Attends Banker Meetings: Never    Marital Status: Widowed   Education Assessment and Provision:  Detailed education and instructions provided on heart failure disease management including the following:  Signs and symptoms of Heart Failure When to call the physician Importance of daily weights Low sodium diet Fluid restriction Medication management Anticipated future follow-up appointments  Patient education given on each of the above topics.  Patient acknowledges understanding via teach back method and acceptance of all instructions.  Education Materials:  "Living Better With Heart Failure" Booklet, HF zone tool, & Daily Weight Tracker Tool.  Patient has scale at home: Yes Patient has pill box at home: Yes    High Risk Criteria for Readmission and/or Poor Patient Outcomes: Heart failure hospital admissions (last 6 months): 1  No Show rate:  2 % Difficult social situation: No, lives with her son.  Demonstrates medication adherence: Yes Primary Language: English Literacy level: Reading, writing, and comprehension  Barriers of Care:   Diet/ fluid restrictions ( drinks Diet Coke daily) Daily weights  Considerations/Referrals:   Referral made to Heart Failure Pharmacist Stewardship: yes Referral made to Heart Failure CSW/NCM TOC: NA Referral made to Heart & Vascular TOC clinic: Yes, per Dr. Jomarie Longs, scheduled on 05/25/2023 @ 11 :45 am.   Items for Follow-up on DC/TOC: Continued HF education Diet/ fluid restrictions ( diet Coke daily)  Daily weights   Rhae Hammock, BSN, RN Heart Failure Teacher, adult education Only

## 2023-05-17 NOTE — Progress Notes (Signed)
 Heart Failure Stewardship Pharmacist Progress Note   PCP: Corwin Levins, MD PCP-Cardiologist: Bryan Lemma, MD    HPI:  83 yo F with PMH of HTN, HLD, T2DM, CKD3, CAD, paroxysmal afib, recurrent GI bleed, severe aortic stenosis s/p TAVR complicated with transient heart block, respiratory failure on 2L [place] at BL, hypothyroidism, GERD   Patient previously admitted 02/2023 for CHF exacerbation. Patient was diuresed during admission and home lasix was increased from prn to daily.   Presented to the ED on 4/7 with worsening SOB with exertion and leg swelling over the last 2 weeks. Also presented with productive cough, chest pain/tightness, NVD, and wheezing. Reports PCP instructed her to take an additional dose of 20mg  lasix in the setting of weight gain of 3 lb - no improvement per pt. Endorses right flank pain 2/2 coughing.   In the ED, patient was hypertensive and hypoxic (did not present on her home O2) wheezing, rhonchi, rales and BL LE edema present on exam. Slight murmur present. JVD Patient diuresed with lasix 40mg  x2 in the ED. CXR showed mild to moderate pulmonary vascular congestion. No dense consolidation or lung collapse. ECHO 4/8 showed LVEF 60-65% (unchanged from prior 11/2022) with normal LV function. Severe asymmetric LV hypertrophy of septal segment. LV diastolic function could not be evaluated. Normal RV function. Mildly enlarged LV. MV is degenerative with trivial regurgitation. Mild stenosis and severe annular calcifications also present. Mild to moderate tricuspid valve regurgitation. Cardiology was consulted, planning with diuresis for now.   Patient reports SOB, congestion, and productive cough when laying down but overall improved from yesterday. Patient still requiring 6L HFNC. Patient expresses confusion on why she is still hospitalized and desires to be at home. It was explained to the patient that she still needs to be monitored and that medication changes are made to help  with current symptoms before safely being discharged. Patient was informed that her son was contacted yesterday regarding using TOC for discharge medications but [facility] - patient is agreeable with plan. Compression stockings present on exam and BL leg swelling (1+ edema) improving from yesterday.    Current HF Medications: Diuretic: IV Lasix 60mg  BID  Beta Blocker: Metoprolol succinate 12.5mg  daily  Other: isosorbide mononitrate 30mg  daily, hydralazine 25mg  q8h  Prior to admission HF Medications: Diuretic: Lasix 60mg  daily Beta blocker: Metoprolol succinate 12.5mg  daily  Other: Isosorbide mononitrate 15mg  daily, hydralazine 25mg  q8h  Pertinent Lab Values: Serum creatinine 1.78>1.92>1.62 (BL 1.3-1.5?), BUN 35>28, Potassium 4.1>3.3, Sodium 134, BNP 977.8, Magnesium 2.2>1.4, A1c 6.5   Vital Signs: Weight: (bed) 191 lbs (admission weight: 191 lbs) Blood pressure: 150-160s/50-90s  Heart rate: 60-80s  I/O: net -2.35L yesterday; net -4.1L since admission 3L [place] > 7L  HFNC > 6 L HFNC   Medication Assistance / Insurance Benefits Check: Does the patient have prescription insurance?  Yes Type of insurance plan: Medicare  Outpatient Pharmacy:  Prior to admission [facility]  Is the patient willing to use [facility] TOC pharmacy at discharge? Yes Is the patient willing to transition their outpatient pharmacy to utilize a [facility] outpatient pharmacy? No    Assessment: 1. Acute on chronic diastolic CHF (LVEF 60-65%), due to unknown etiology . NYHA class 2-3 symptoms.Strict I/Os and daily weights. Keep K>4 and Mg>2.  - Due to oxygen status not at baseline and BL pitting edema, agree with continuing IV diuresis at this time and continue with compression stockings  - No ACE/ARB/ARNI or MRA given  renal dysfunction - Defer titrating Metoprolol succinate to 25mg  daily for BP control until euvolemic  - Consider HFpEF GDMT with SGLT2i now that her Scr has  improved     Plan: 1) Medication changes recommended at this time: - Replace K with x2 - Increase hydralazine to 50mg  TID to increase BP control  - Start Jardiance 10mg  daily today if Scr improves to baseline  - Monitor O2 status   2) Patient assistance: - Contacted son and he is interested in utilizing [facility] for medication changes upon discharge  but would [facility] and remaining prescriptions  - Son reported that current medications are affordable  3)  Education  - Patient has been educated on current HF medications and potential additions to HF medication regimen - Patient verbalizes understanding that over the next few months, these medication doses may change and more medications may be added to optimize HF regimen - Patient has been educated on basic disease state pathophysiology and goals of therapy   Verdene Rio, PharmD PGY1 Pharmacy Resident

## 2023-05-18 DIAGNOSIS — I2510 Atherosclerotic heart disease of native coronary artery without angina pectoris: Secondary | ICD-10-CM | POA: Diagnosis not present

## 2023-05-18 DIAGNOSIS — I5033 Acute on chronic diastolic (congestive) heart failure: Secondary | ICD-10-CM | POA: Diagnosis not present

## 2023-05-18 DIAGNOSIS — I4819 Other persistent atrial fibrillation: Secondary | ICD-10-CM | POA: Diagnosis not present

## 2023-05-18 DIAGNOSIS — E1169 Type 2 diabetes mellitus with other specified complication: Secondary | ICD-10-CM | POA: Diagnosis not present

## 2023-05-18 LAB — GLUCOSE, CAPILLARY
Glucose-Capillary: 252 mg/dL — ABNORMAL HIGH (ref 70–99)
Glucose-Capillary: 276 mg/dL — ABNORMAL HIGH (ref 70–99)
Glucose-Capillary: 233 mg/dL — ABNORMAL HIGH (ref 70–99)
Glucose-Capillary: 263 mg/dL — ABNORMAL HIGH (ref 70–99)

## 2023-05-18 LAB — BASIC METABOLIC PANEL WITH GFR
Anion gap: 7 (ref 5–15)
BUN: 32 mg/dL — ABNORMAL HIGH (ref 8–23)
CO2: 31 mmol/L (ref 22–32)
Calcium: 8.2 mg/dL — ABNORMAL LOW (ref 8.9–10.3)
Chloride: 94 mmol/L — ABNORMAL LOW (ref 98–111)
Creatinine, Ser: 2.17 mg/dL — ABNORMAL HIGH (ref 0.44–1.00)
GFR, Estimated: 22 mL/min — ABNORMAL LOW (ref >60)
Glucose, Bld: 266 mg/dL — ABNORMAL HIGH (ref 70–99)
Potassium: 4.1 mmol/L (ref 3.5–5.1)
Sodium: 132 mmol/L — ABNORMAL LOW (ref 135–145)

## 2023-05-18 LAB — CBC
HCT: 23.1 % — ABNORMAL LOW (ref 36.0–46.0)
Hemoglobin: 7.3 g/dL — ABNORMAL LOW (ref 12.0–15.0)
MCH: 29.3 pg (ref 26.0–34.0)
MCHC: 31.6 g/dL (ref 30.0–36.0)
MCV: 92.8 fL (ref 80.0–100.0)
Platelets: 159 10*3/uL (ref 150–400)
RBC: 2.49 MIL/uL — ABNORMAL LOW (ref 3.87–5.11)
RDW: 15.5 % (ref 11.5–15.5)
WBC: 7.2 10*3/uL (ref 4.0–10.5)
nRBC: 0 % (ref 0.0–0.2)

## 2023-05-18 MED ORDER — ISOSORBIDE MONONITRATE ER 60 MG PO TB24
60.0000 mg | ORAL_TABLET | Freq: Every day | ORAL | Status: DC
  Administered 2023-05-19 – 2023-06-07 (×19): 60 mg via ORAL
  Filled 2023-05-18 (×19): qty 1

## 2023-05-18 MED ORDER — IRON SUCROSE 200 MG IVPB - SIMPLE MED
200.0000 mg | Status: DC
  Administered 2023-05-18: 200 mg via INTRAVENOUS
  Filled 2023-05-18: qty 200
  Filled 2023-05-18: qty 110

## 2023-05-18 MED ORDER — METHYLPREDNISOLONE SODIUM SUCC 40 MG IJ SOLR
40.0000 mg | Freq: Two times a day (BID) | INTRAMUSCULAR | Status: AC
  Administered 2023-05-18: 40 mg via INTRAVENOUS
  Filled 2023-05-18: qty 1

## 2023-05-18 MED ORDER — INSULIN GLARGINE-YFGN 100 UNIT/ML ~~LOC~~ SOLN
20.0000 [IU] | Freq: Every day | SUBCUTANEOUS | Status: DC
  Administered 2023-05-18 – 2023-05-30 (×12): 20 [IU] via SUBCUTANEOUS
  Filled 2023-05-18 (×13): qty 0.2

## 2023-05-18 NOTE — Progress Notes (Signed)
 Mobility Specialist Progress Note:    05/18/23 1220  Mobility  Activity Ambulated with assistance in room  Level of Assistance Contact guard assist, steadying assist  Assistive Device Front wheel walker  Distance Ambulated (ft) 20 ft  Activity Response Tolerated well  Mobility Referral Yes  Mobility visit 1 Mobility  Mobility Specialist Start Time (ACUTE ONLY) 1105  Mobility Specialist Stop Time (ACUTE ONLY) 1120  Mobility Specialist Time Calculation (min) (ACUTE ONLY) 15 min   Pt received in chair agreeable to mobility. No c/o throughout. Pt ambulated on 6L/min, VSS. Pt requested to stay in room d/t fear of urinary incontinence. Returned to chair w/o fault. Call bell and personal belongings in reach. All needs met. Chair alarm on. Respiratory in room.  Thompson Grayer Mobility Specialist  Please contact vis Secure Chat or  Rehab Office [phone]

## 2023-05-18 NOTE — Plan of Care (Signed)

## 2023-05-18 NOTE — Progress Notes (Signed)
 Heart Failure Stewardship Pharmacist Progress Note   PCP: Corwin Levins, MD PCP-Cardiologist: Bryan Lemma, MD    HPI:  83 yo F with PMH of HTN, HLD, T2DM, CKD3, CAD, paroxysmal afib, recurrent GI bleed, severe aortic stenosis s/p TAVR complicated with transient heart block, respiratory failure on 2L [place] at BL, hypothyroidism, GERD   Patient previously admitted 02/2023 for CHF exacerbation. Patient was diuresed during admission and home lasix was increased from prn to daily.   Presented to the ED on 4/7 with worsening SOB with exertion and leg swelling over the last 2 weeks. Also presented with productive cough, chest pain/tightness, NVD, and wheezing. Reports PCP instructed her to take an additional dose of 20mg  lasix in the setting of weight gain of 3 lb - no improvement per pt. Endorses right flank pain 2/2 coughing.   In the ED, patient was hypertensive and hypoxic (did not present on her home O2) wheezing, rhonchi, rales and BL LE edema present on exam. Slight murmur present. JVD Patient diuresed with lasix 40mg  x2 in the ED. CXR showed mild to moderate pulmonary vascular congestion. No dense consolidation or lung collapse. ECHO 4/8 showed LVEF 60-65% (unchanged from prior 11/2022) with normal LV function. Severe asymmetric LV hypertrophy of septal segment. LV diastolic function could not be evaluated. Normal RV function. Mildly enlarged LV. MV is degenerative with trivial regurgitation. Mild stenosis and severe annular calcifications also present. Mild to moderate tricuspid valve regurgitation. Cardiology was consulted, planning with diuresis for now.   Patient reports SOB, congestion when laying down but still improving. Patient oxygen status improving and weaned to 2L [place]. Patient expresses frustration with productive cough that does not "seem to be getting better." Patient informed that has as needed cough medication for cough that she can request. Patient expresses that she wants to go  home - was informed that we are watching her kidney status before she can be safely discharged Compression stockings off today. Patient winced upon assessing fluid status in BL legs, but overall  improving from yesterday.   Current HF Medications: Diuretic: IV Lasix 60mg  BID  Beta Blocker: Metoprolol succinate 12.5mg  daily  Other: isosorbide mononitrate 30mg  daily, hydralazine 25mg  q8h  Prior to admission HF Medications: Diuretic: Lasix 60mg  daily Beta blocker: Metoprolol succinate 12.5mg  daily  Other: Isosorbide mononitrate 15mg  daily, hydralazine 25mg  q8h  Pertinent Lab Values: Serum creatinine 1.78>1.92>1.62 >2.17 (BL 1.3-1.5?), BUN 35>28>32, Potassium 4.1>3.3>4.1, Sodium 134>132, BNP 977.8, Magnesium 2.2>1.4>2, A1c 6.5   Vital Signs: Weight: (bed) 191> (bed) 183 lbs (admission weight: 191 lbs) Blood pressure: 150-180s/50-90s  Heart rate: 60-80s  I/O: net -1.36L yesterday; net -5.26L since admission 3L [place] > 7L  HFNC > 6 L HFNC > 3L [place]   Medication Assistance / Insurance Benefits Check: Does the patient have prescription insurance?  Yes Type of insurance plan: Medicare  Outpatient Pharmacy:  Prior to admission [facility]  Is the patient willing to use [facility] TOC pharmacy at discharge? Yes Is the patient willing to transition their outpatient pharmacy to utilize a [facility] outpatient pharmacy? No    Assessment: 1. Acute on chronic diastolic CHF (LVEF 60-65%), due to unknown etiology . NYHA class 2-3 symptoms.Strict I/Os and daily weights. Keep K>4 and Mg>2. Acute rise in Scr, could be evidence of euvolemia or more renal congestion. Overall oxygen status improving and almost at baseline.  - Due to acute rise in Scr, agree with holding IV diuresis at this time.  - No ACE/ARB/ARNI  or MRA given renal dysfunction - Defer titrating Metoprolol succinate to 25mg  daily for BP control until euvolemic  - Agree with deferring SGLT2i given hx of 3 UTI in prior 1-2  months  - Agree with increasing imdur to 60mg  for BP control  - Consider starting mucinex if productive cough after adequate diuresis     Plan: 1) Medication changes recommended at this time: - Increase hydralazine to 50mg  TID to increase BP control  - Monitor O2 status  - order Mg  - Monitor renal function   2) Patient assistance: - Contacted son and he is interested in utilizing [facility] for medication changes upon discharge  but would [facility] and remaining prescriptions  - Son reported that current medications are affordable  3)  Education  - Patient has been educated on current HF medications and potential additions to HF medication regimen - Patient verbalizes understanding that over the next few months, these medication doses may change and more medications may be added to optimize HF regimen - Patient has been educated on basic disease state pathophysiology and goals of therapy   Verdene Rio, PharmD PGY1 Pharmacy Resident

## 2023-05-18 NOTE — Progress Notes (Signed)
 PROGRESS NOTE    Mary Mccarthy  GNF:[id number] DOB: [birth year] DOA: 05/14/2023 PCP: Corwin Levins, MD  82/F w hypertension, hyperlipidemia, paroxysmal atrial fibrillation, aortic stenosis sp TAVR, T2DM and obesity who presented with dyspnea and lower extremity edema. In the ED, Hypoxic and Vol overloaded, cr 1,98  BNP 977, troponin 35 and 31 , CXR wpositive cardiomegaly, bilateral hilar vascular congestion with cephalization of the vasculature.   Subjective: -Feels better today, breathing is improving  Assessment and Plan:  Acute on chronic diastolic CHF  Pulmonary hypertension Acute hypoxic respiratory failure -Echo now with EF 60-65%, normal RV, moderately elevated PASP, TAVR -Diuresed with IV Lasix, volume status is improving, 5.3 L negative, creatinine bumped to 2.1 today, will hold further IV Lasix  -continue hydralazine, Imdur, metoprolol -Poor candidate for SGLT2i-history of multiple UTIs -GDMT limited by CKD -Increase activity, wean O2  Suspected COPD with mild exacerbation Bronchitis -Exposed to secondhand smoke for decades -Diffuse scattered rhonchi, productive cough -Continue DuoNebs, continue IV Solu-Medrol 1 more day today, changed to prednisone taper tomorrow -Flutter valve, Mucinex  Iron deficiency anemia -Hemoglobin down to 7.3, she denies overt blood loss -Starting IV iron today, transfuse if hemoglobin does not improve  Persistent atrial fibrillation (HCC) Continue amiodarone and metoprolol.  Declines anticoagulation  Coronary artery disease, non-occlusive Stable, continue statin  SP TAVR 9/22  Hypertensive urgency Continue metoprolol, hydralazine and isosorbide.   CKD (chronic kidney disease), stage IV (HCC) -Now improving, monitor with diuresis  Type 2 diabetes mellitus with hyperlipidemia (HCC) -Started on Semglee, has steroid-induced hyperglycemia  Anxiety with depression Continue mirtazapine and alprazolam.    Hypothyroidism Continue levothyroxine    DVT prophylaxis: lovenox Code Status: Full Code Family Communication: None present Disposition Plan: Home pending improvement  Consultants:   :    Objective: Vitals:   05/18/23 0737 05/18/23 0740 05/18/23 0829 05/18/23 0910  BP: (!) 121/41 (!) 156/53 (!) 156/53   Pulse: 64 64 64   Resp: 19 (!) 24    Temp: 97.6 F (36.4 C) (!) 97.5 F (36.4 C)    TempSrc: Oral Oral    SpO2: 100% 100%  94%  Weight:      Height:        Intake/Output Summary (Last 24 hours) at 05/18/2023 1127 Last data filed at 05/18/2023 0800 Gross per 24 hour  Intake 600 ml  Output 1775 ml  Net -1175 ml   Filed Weights   05/16/23 0600 05/17/23 0413 05/18/23 0532  Weight: 85 kg 86.7 kg 83.2 kg    Examination:  Gen: Awake, Alert, Oriented X 3,  HEENT: Positive JVD Lungs: Improving air movement, few scattered rhonchi CVS: S1S2/RRR Abd: soft, Non tender, non distended, BS present Extremities: Trace edema Skin: no new rashes on exposed skin     Data Reviewed:   CBC: Recent Labs  Lab 05/14/23 0922 05/17/23 0320 05/18/23 0317  WBC 5.2 7.0 7.2  NEUTROABS 3.9  --   --   HGB 8.1* 7.4* 7.3*  HCT 27.1* 23.8* 23.1*  MCV 97.5 92.2 92.8  PLT 223 142* 159   Basic Metabolic Panel: Recent Labs  Lab 05/14/23 0922 05/15/23 0301 05/16/23 0258 05/17/23 0320 05/17/23 0836 05/18/23 0317  NA 135 135 134* 134*  --  132*  K 4.1 3.9 4.1 3.3*  --  4.1  CL 100 98 96* 94*  --  94*  CO2 25 23 27  33*  --  31  GLUCOSE 109* 158* 152* 123*  --  266*  BUN  24* 30* 35* 28*  --  32*  CREATININE 1.98* 1.78* 1.92* 1.62*  --  2.17*  CALCIUM 9.4 8.9 9.1 8.3*  --  8.2*  MG  --   --  1.4*  --  2.0  --    GFR: Estimated Creatinine Clearance: 18.9 mL/min (A) (by C-G formula based on SCr of 2.17 mg/dL (H)). Liver Function Tests: Recent Labs  Lab 05/14/23 0922  AST 58*  ALT 28  ALKPHOS 48  BILITOT 0.9  PROT 7.8  ALBUMIN 3.2*   Recent Labs  Lab 05/14/23 0922   LIPASE 19   No results for input(s): "AMMONIA" in the last 168 hours. Coagulation Profile: No results for input(s): "INR", "PROTIME" in the last 168 hours. Cardiac Enzymes: No results for input(s): "CKTOTAL", "CKMB", "CKMBINDEX", "TROPONINI" in the last 168 hours. BNP (last 3 results) Recent Labs    03/14/23 0924  PROBNP 235.0*   HbA1C: No results for input(s): "HGBA1C" in the last 72 hours. CBG: Recent Labs  Lab 05/17/23 1032 05/17/23 1145 05/17/23 1609 05/17/23 2136 05/18/23 0613  GLUCAP 159* 129* 225* 204* 252*   Lipid Profile: No results for input(s): "CHOL", "HDL", "LDLCALC", "TRIG", "CHOLHDL", "LDLDIRECT" in the last 72 hours. Thyroid Function Tests: No results for input(s): "TSH", "T4TOTAL", "FREET4", "T3FREE", "THYROIDAB" in the last 72 hours.  Anemia Panel: Recent Labs    05/17/23 0320  VITAMINB12 1,877*  FOLATE 7.5  FERRITIN 55  TIBC 293  IRON 17*  RETICCTPCT 1.4   Urine analysis:    Component Value Date/Time   COLORURINE YELLOW 05/14/2023 1148   APPEARANCEUR TURBID (A) 05/14/2023 1148   LABSPEC 1.014 05/14/2023 1148   PHURINE 5.0 05/14/2023 1148   GLUCOSEU NEGATIVE 05/14/2023 1148   GLUCOSEU NEGATIVE 03/15/2022 1217   HGBUR NEGATIVE 05/14/2023 1148   BILIRUBINUR NEGATIVE 05/14/2023 1148   KETONESUR NEGATIVE 05/14/2023 1148   PROTEINUR 30 (A) 05/14/2023 1148   UROBILINOGEN 0.2 03/15/2022 1217   NITRITE NEGATIVE 05/14/2023 1148   LEUKOCYTESUR NEGATIVE 05/14/2023 1148   Sepsis Labs: @LABRCNTIP (procalcitonin:4,lacticidven:4)  ) Recent Results (from the past 240 hours)  Resp panel by RT-PCR (RSV, Flu A&B, Covid) Anterior Nasal Swab     Status: None   Collection Time: 05/14/23  9:31 AM   Specimen: Anterior Nasal Swab  Result Value Ref Range Status   SARS Coronavirus 2 by RT PCR NEGATIVE NEGATIVE Final   Influenza A by PCR NEGATIVE NEGATIVE Final   Influenza B by PCR NEGATIVE NEGATIVE Final    Comment: (NOTE) The Xpert Xpress  SARS-CoV-2/FLU/RSV plus assay is intended as an aid in the diagnosis of influenza from Nasopharyngeal swab specimens and should not be used as a sole basis for treatment. Nasal washings and aspirates are unacceptable for Xpert Xpress SARS-CoV-2/FLU/RSV testing.  Fact Sheet for Patients: BloggerCourse.com  Fact Sheet for Healthcare Providers: SeriousBroker.it  This test is not yet approved or cleared by the [place] FDA and has been authorized for detection and/or diagnosis of SARS-CoV-2 by FDA under an Emergency Use Authorization (EUA). This EUA will remain in effect (meaning this test can be used) for the duration of the COVID-19 declaration under Section 564(b)(1) of the Act, 21 U.S.C. section 360bbb-3(b)(1), unless the authorization is terminated or revoked.     Resp Syncytial Virus by PCR NEGATIVE NEGATIVE Final    Comment: (NOTE) Fact Sheet for Patients: BloggerCourse.com  Fact Sheet for Healthcare Providers: SeriousBroker.it  This test is not yet approved or cleared by the [place] FDA and has  been authorized for detection and/or diagnosis of SARS-CoV-2 by FDA under an Emergency Use Authorization (EUA). This EUA will remain in effect (meaning this test can be used) for the duration of the COVID-19 declaration under Section 564(b)(1) of the Act, 21 U.S.C. section 360bbb-3(b)(1), unless the authorization is terminated or revoked.  Performed at [facility], [address] [place], [place] [address]      Radiology Studies: No results found.    Scheduled Meds:  amiodarone  200 mg Oral Daily   atorvastatin  40 mg Oral Daily   enoxaparin (LOVENOX) injection  30 mg Subcutaneous Q24H   feeding supplement  237 mL Oral BID BM   fesoterodine  4 mg Oral Daily   hydrALAZINE  25 mg Oral Q8H   insulin aspart  0-5 Units Subcutaneous QHS   insulin aspart  0-6  Units Subcutaneous TID WC   insulin glargine-yfgn  20 Units Subcutaneous Daily   ipratropium-albuterol  3 mL Nebulization QID   [START ON 05/19/2023] isosorbide mononitrate  60 mg Oral Daily   levothyroxine  25 mcg Oral Daily   methylPREDNISolone (SOLU-MEDROL) injection  40 mg Intravenous BID   metoprolol succinate  12.5 mg Oral Daily   mirtazapine  15 mg Oral QHS   pantoprazole  40 mg Oral BID   sodium chloride flush  3 mL Intravenous Q12H   Continuous Infusions:   LOS: 4 days    Time spent:    Zannie Cove, MD [facility]   05/18/2023, 11:27 AM

## 2023-05-18 NOTE — Progress Notes (Signed)
 Rounding Note    Patient Name: Mary Mccarthy Date of Encounter: 05/18/2023  [facility] Cardiologist: Bryan Lemma, MD  Chief Complaint:  Chief Complaint  Patient presents with   Shortness of Breath   Reason of consult: CHF  Subjective   Denies anginal chest pain. Shortness of breath improving but still requiring more than anticipated oxygen supplementation and coughing.  Inpatient Medications    Scheduled Meds:  amiodarone  200 mg Oral Daily   atorvastatin  40 mg Oral Daily   enoxaparin (LOVENOX) injection  30 mg Subcutaneous Q24H   feeding supplement  237 mL Oral BID BM   fesoterodine  4 mg Oral Daily   hydrALAZINE  25 mg Oral Q8H   insulin aspart  0-5 Units Subcutaneous QHS   insulin aspart  0-6 Units Subcutaneous TID WC   insulin glargine-yfgn  20 Units Subcutaneous Daily   ipratropium-albuterol  3 mL Nebulization QID   [START ON 05/19/2023] isosorbide mononitrate  60 mg Oral Daily   levothyroxine  25 mcg Oral Daily   methylPREDNISolone (SOLU-MEDROL) injection  40 mg Intravenous BID   metoprolol succinate  12.5 mg Oral Daily   mirtazapine  15 mg Oral QHS   pantoprazole  40 mg Oral BID   sodium chloride flush  3 mL Intravenous Q12H   Continuous Infusions:  PRN Meds: acetaminophen, ALPRAZolam, guaiFENesin-dextromethorphan, hydrALAZINE, ipratropium-albuterol, sodium chloride flush, trimethobenzamide   Vital Signs    Vitals:   05/18/23 0737 05/18/23 0740 05/18/23 0829 05/18/23 0910  BP: (!) 121/41 (!) 156/53 (!) 156/53   Pulse: 64 64 64   Resp: 19 (!) 24    Temp: 97.6 F (36.4 C) (!) 97.5 F (36.4 C)    TempSrc: Oral Oral    SpO2: 100% 100%  94%  Weight:      Height:        Intake/Output Summary (Last 24 hours) at 05/18/2023 1034 Last data filed at 05/18/2023 0800 Gross per 24 hour  Intake 600 ml  Output 1775 ml  Net -1175 ml      05/18/2023    5:32 AM 05/17/2023    4:13 AM 05/16/2023    6:00 AM  Last 3 Weights  Weight  (lbs) 183 lb 6.8 oz 191 lb 2.2 oz 187 lb 6.3 oz  Weight (kg) 83.2 kg 86.7 kg 85 kg      Telemetry    SR - Personally Reviewed  ECG    No new tracing - Personally Reviewed  Physical Exam   General: Age-appropriate female, hemodynamically stable, on nasal cannula oxygen HEENT: Normocephalic, atraumatic, dry mucous membranes, trachea midline, -JVP Lungs: Good inspiratory effort, bilateral crackles and rhonchi, no expiratory wheezing. Heart: Regular, positive S1-S2, soft holosystolic murmur Abdomen: Soft, nontender, nondistended, positive bowel sounds in all 4 quadrants Extremities: Warm to touch, 1+ bilateral DP pulses, no bilateral leg swelling Neuro: Alert oriented x 4, moves all 4 extremities, strength 3+ in all 4 extremities Psych: Normal affect  Labs    High Sensitivity Troponin:   Recent Labs  Lab 05/14/23 0922 05/14/23 1340  TROPONINIHS 35* 31*     Chemistry Recent Labs  Lab 05/14/23 0922 05/15/23 0301 05/16/23 0258 05/17/23 0320 05/17/23 0836 05/18/23 0317  NA 135   < > 134* 134*  --  132*  K 4.1   < > 4.1 3.3*  --  4.1  CL 100   < > 96* 94*  --  94*  CO2 25   < > 27  33*  --  31  GLUCOSE 109*   < > 152* 123*  --  266*  BUN 24*   < > 35* 28*  --  32*  CREATININE 1.98*   < > 1.92* 1.62*  --  2.17*  CALCIUM 9.4   < > 9.1 8.3*  --  8.2*  MG  --   --  1.4*  --  2.0  --   PROT 7.8  --   --   --   --   --   ALBUMIN 3.2*  --   --   --   --   --   AST 58*  --   --   --   --   --   ALT 28  --   --   --   --   --   ALKPHOS 48  --   --   --   --   --   BILITOT 0.9  --   --   --   --   --   GFRNONAA 24*   < > 25* 31*  --  22*  ANIONGAP 10   < > 11 7  --  7   < > = values in this interval not displayed.    Lipids No results for input(s): "CHOL", "TRIG", "HDL", "LABVLDL", "LDLCALC", "CHOLHDL" in the last 168 hours.  Hematology Recent Labs  Lab 05/14/23 0922 05/17/23 0320 05/18/23 0317  WBC 5.2 7.0 7.2  RBC 2.78* 2.58*  2.58* 2.49*  HGB 8.1* 7.4* 7.3*  HCT  27.1* 23.8* 23.1*  MCV 97.5 92.2 92.8  MCH 29.1 28.7 29.3  MCHC 29.9* 31.1 31.6  RDW 15.7* 15.8* 15.5  PLT 223 142* 159   Thyroid  Recent Labs  Lab 05/14/23 1340  TSH 5.654*    BNP Recent Labs  Lab 05/14/23 0922  BNP 977.8*    DDimer No results for input(s): "DDIMER" in the last 168 hours.   Radiology    NA  Cardiac Studies   Echo  11/2022: LVEF 60 to 65%, grade 2 diastolic dysfunction, severely dilated left atrium, severe MAC, 26 mm Medtronic stent and TAVR valve present with paravalvular leak  05/2023:  1. Left ventricular ejection fraction, by estimation, is 60 to 65%. The  left ventricle has normal function. The left ventricle has no regional  wall motion abnormalities. There is severe asymmetric left ventricular  hypertrophy of the septal segment. Left   ventricular diastolic function could not be evaluated.   2. Right ventricular systolic function is normal. The right ventricular  size is mildly enlarged. There is moderately elevated pulmonary artery  systolic pressure. The estimated right ventricular systolic pressure is  53.7 mmHg.   3. Left atrial size was mildly dilated.   4. Right atrial size was mildly dilated.   5. The mitral valve is degenerative. Trivial mitral valve regurgitation.  Mild mitral stenosis. The mean mitral valve gradient is 3.0 mmHg. Severe  mitral annular calcification.   6. Tricuspid valve regurgitation is mild to moderate.   7. Mild paravalvular leak adjacent to the intervalvular fibrosa. The  aortic valve has been repaired/replaced. Aortic valve regurgitation is  mild. No aortic stenosis is present. There is a 26 mm Medtronic stented  (TAVR) valve present in the aortic  position. Procedure Date: 10/26/2020. Echo findings are consistent with  normal structure and function of the aortic valve prosthesis. Echo  findings are consistent with perivalvular leak of the aortic prosthesis.  Aortic  regurgitation PHT measures 514 msec.    Aortic valve mean gradient measures 9.0 mmHg. Aortic valve Vmax measures  1.88 m/s.   8. The inferior vena cava is dilated in size with <50% respiratory  variability, suggesting right atrial pressure of 15 mmHg.   Comparison(s): Prior images reviewed side by side. PVL is more prominent,  stable RVSP.    RHC/LHC 09/2020, performed by Dr. Clifton James   Prox RCA lesion is 20% stenosed.   Mid RCA to Dist RCA lesion is 20% stenosed.   Ramus lesion is 30% stenosed.   Mid LAD lesion is 20% stenosed.   Dist LM to Prox LAD lesion is 20% stenosed.   Mild non-obstructive CAD Severe aortic stenosis (mean gradient 52.8 mmHg, peak to peak gradient 58 mmHg, AVA 0.90 cm2).   Patient Profile     83 y.o. female with a hx of HTN, HLD, chronic respiratory failure on 2 L oxygen at baseline,chronic diastolic heart failure, persistent A. Fib not on chronic AC due to recurrent GI bleed, history of PUD/AVM, history of CVA, severe AS s/p TAVR (26 mm Medtronic Evolut Pro+ THV via TF approach in 10/2020) complicated by transient complete heart block, type 2 diabetes, mild CAD by pre-TAVR cath 09/2020, hypothyroidism, GERD, anemia previously requiring transfusions who is being seen 05/15/2023 for the evaluation of acute on chronic diastolic heart failure at the request of Dr. Ella Jubilee.   Assessment & Plan    Impression:  Acute exacerbation of heart failure with preserved EF. Persistent atrial fibrillation, currently sinus. Long-term antiarrhythmic medications. Dyspnea: Multifactorial Mild nonobstructive CAD. Elevated troponins, demand ischemia. 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 Severe aortic stenosis status post TAVR 10/2020   Recommendations:  Acute exacerbation of heart failure with preserved EF. -Improving.   -Weight at the last hospital discharge in January 2025, 185 pounds.   -Weight on admission 194 pounds and weight today 183# -Continue home  dose beta-blocker therapy -Strict I's and O's and daily weights -Continue telemetry - Net IO Since Admission: -5,269 mL [05/18/23 1034] -Had increased IV Lasix 60 mg tid on 05/17/2023 but agree w/ d/c for now due to AKI -Bilateral compression stockings to help mobilize fluid -Monitor BUN and creatinine -Continue hydralazine and isosorbide mononitrate combination. -Continue Imdur 30 mg po qday. -Not a candidate for SGLT2 inhibitors at this time, patient informs me that she has had at least 3 urinary tract infections in the last 1.5 months. - Up titration on GDMT once renal function improves.    Persistent atrial fibrillation, currently sinus. Long-term antiarrhythmic medications. Currently in sinus rhythm. Rate control Toprol-XL. Rhythm control: Amiodarone. Thromboembolic prophylaxis N/A due to prior bleeding and has been evaluated for watchman as well, see prior progress notes for details. Patient is aware that not being on anticoagulation does predispose her to thromboembolic events and should be cognizant for symptoms and if present should come to the closest ER via EMS for further evaluation and management  Dyspnea:  Multifactorial  Heart failure management as discussed above. Atrial fibrillation management as discussed above. Defer to primary team with regards to workup for pneumonia versus other etiologies given her lung examination findings. Nurse informs me that she is scheduled to receive steroids today. Recommend incentive spirometer.   Mild nonobstructive CAD. Denies anginal chest pain. EKG is nonischemic. Monitor for now.   Elevated troponins. Demand ischemia in the setting of hypertensive urgency on arrival as well as acute decompensated heart failure. No chest pain and additional  testing warranted at this time from cardiology standpoint.   Hyperlipidemia Continue current medical therapy   Hypertension with chronic kidney disease stage III/IV and congestive heart  failure. Currently on isosorbide mononitrate and hydralazine. Would like to avoid ARB's, Arni, ACE inhibitors, MRA's due to AKI - reconsider once Cr improves.  SGLT2i - CI due to 3 UTI over the last 1.5 months, may revisit later.    Severe aortic stenosis status post TAVR 10/2020:  Stable Echo 05/2023: MG but PVL is more prominent compared to prior study.  Antibiotic prophylaxis prior to dental procedures.    For questions or updates, please contact [facility] Please consult [email or web address] for contact info under     Signed, Tessa Lerner, DO, [facility]  [facility]  [address] [place], [place] [address] Pager: [phone] Office: [phone] 05/18/2023, 10:34 AM

## 2023-05-18 NOTE — Progress Notes (Signed)
 Occupational Therapy Treatment Patient Details Name: Mary Mccarthy MRN: [id number] DOB: [birth year] Today's Date: 05/18/2023   History of present illness Patient is a 83 year old female with acute on chronic respiratory failure with hypoxia secondary to acute on chronic diastolic CHF. History of HTN, hyperlipidemia, respiratory failure on 2 L, PAF, GI bleed, aortic stenosis s/p TAVR,  transient heart block, diabetes mellitus type II, CAD, hypothyroidism, GERD   OT comments  Patient seen in order to increase overall functional activity tolerance. Patient motivated to transition to recliner. Patient requiring mod A for bed mobility, and CGA for stand pivot to recliner. Patient on 3L O2 at beginning of session, but dropping to 87% with minimal movement, therefore increased to 4L O2 with O2 improving to 94%. Patient with audible wheezing and exertion with minimal movement. OT recommendation remains apprpriate; will continue to follow acutely.       If plan is discharge home, recommend the following:  Help with stairs or ramp for entrance;A little help with bathing/dressing/bathroom;A little help with walking and/or transfers;Assistance with cooking/housework;Assist for transportation   Equipment Recommendations  None recommended by OT    Recommendations for Other Services      Precautions / Restrictions Precautions Precautions: Fall Recall of Precautions/Restrictions: Intact Precaution/Restrictions Comments: watch SpO2 Restrictions Weight Bearing Restrictions Per Provider Order: No       Mobility Bed Mobility Overal bed mobility: Needs Assistance Bed Mobility: Supine to Sit     Supine to sit: HOB elevated, Mod assist Sit to supine: Min assist, HOB elevated   General bed mobility comments: Pt sat up on R side of bed with increased time. Patient elevating OB and requiring mod A with use of bed pad to fully come into sitting. Able to scoot feet forward without assist from OT,  but extra time required throughout due to increased extertion    Transfers Overall transfer Mccarthy: Needs assistance Equipment used: Rollator (4 wheels) Transfers: Sit to/from Stand Sit to Stand: Contact guard assist           General transfer comment: Pt stood from lowest bed height a. VC for proper hand positioning and managing brakes on rollator. Cues to fully turn to chair prior to sitting     Balance Overall balance assessment: Needs assistance Sitting-balance support: Feet supported Sitting balance-[name]: Good     Standing balance support: Bilateral upper extremity supported, During functional activity, Reliant on assistive device for balance Standing balance-[name]: Poor Standing balance comment: Pt dependent on rollator for stability.                           ADL either performed or assessed with clinical judgement   ADL Overall ADL's : Needs assistance/impaired                         Toilet Transfer: Minimal assistance;Ambulation;Rollator (4 wheels);Regular Teacher, adult education Details (indicate cue type and reason): simulated with transfer to recliner         Functional mobility during ADLs: Minimal assistance;Cueing for safety;Cueing for sequencing;Rollator (4 wheels) General ADL Comments: Patient seen in order to increase overall functional activity tolerance. Patient motivated to transition to recliner. Patient requiring mod A for bed mobility, and CGA for stand pivot to recliner. Patient on 3L O2 at beginning of session, but dropping to 87% with minimal movement, therefore increased to 4L O2 with O2 improving to 94%. Patient with audible wheezing  and exertion with minimal movement. OT recommendation remains apprpriate; will continue to follow acutely.    Extremity/Trunk Assessment              Occupational psychologist Communication: Impaired Factors Affecting  Communication: Reduced clarity of speech (Pt speaks in low hushed tones)   Cognition Arousal: Alert Behavior During Therapy: WFL for tasks assessed/performed                                 Following commands: Intact        Cueing   Cueing Techniques: Verbal cues  Exercises      Shoulder Instructions       General Comments Requiring up to 4L O2 with minimal movement    Pertinent Vitals/ Pain       Pain Assessment Pain Assessment: No/denies pain  Home Living                                          Prior Functioning/Environment              Frequency  Min 2X/week        Progress Toward Goals  OT Goals(current goals can now be found in the care plan section)  Progress towards OT goals: Progressing toward goals  Acute Rehab OT Goals Patient Stated Goal: to go home OT Goal Formulation: With patient Time For Goal Achievement: 05/29/23 Potential to Achieve Goals: Good  Plan      Co-evaluation                 AM-PAC OT "6 Clicks" Daily Activity     Outcome Measure   Help from another person eating meals?: None Help from another person taking care of personal grooming?: A Little Help from another person toileting, which includes using toliet, bedpan, or urinal?: A Little Help from another person bathing (including washing, rinsing, drying)?: A Little Help from another person to put on and taking off regular upper body clothing?: A Little Help from another person to put on and taking off regular lower body clothing?: A Little 6 Click Score: 19    End of Session Equipment Utilized During Treatment: Rollator (4 wheels)  OT Visit Diagnosis: Unsteadiness on feet (R26.81);Muscle weakness (generalized) (M62.81)   Activity Tolerance Patient tolerated treatment well   Patient Left in chair;with call bell/phone within reach;with chair alarm set   Nurse Communication Mobility status        Time: 4098-1191 OT Time  Calculation (min): 19 min  Charges: OT General Charges $OT Visit: 1 Visit OT Treatments $Self Care/Home Management : 8-22 mins  Pollyann Glen E. Clinton Wahlberg, OTR/L Acute Rehabilitation Services [phone]   Cherlyn Cushing 05/18/2023, 10:47 AM

## 2023-05-19 DIAGNOSIS — I5033 Acute on chronic diastolic (congestive) heart failure: Secondary | ICD-10-CM | POA: Diagnosis not present

## 2023-05-19 DIAGNOSIS — E1169 Type 2 diabetes mellitus with other specified complication: Secondary | ICD-10-CM | POA: Diagnosis not present

## 2023-05-19 DIAGNOSIS — I2510 Atherosclerotic heart disease of native coronary artery without angina pectoris: Secondary | ICD-10-CM | POA: Diagnosis not present

## 2023-05-19 DIAGNOSIS — I4819 Other persistent atrial fibrillation: Secondary | ICD-10-CM | POA: Diagnosis not present

## 2023-05-19 LAB — GLUCOSE, CAPILLARY
Glucose-Capillary: 174 mg/dL — ABNORMAL HIGH (ref 70–99)
Glucose-Capillary: 180 mg/dL — ABNORMAL HIGH (ref 70–99)
Glucose-Capillary: 135 mg/dL — ABNORMAL HIGH (ref 70–99)
Glucose-Capillary: 146 mg/dL — ABNORMAL HIGH (ref 70–99)

## 2023-05-19 LAB — BASIC METABOLIC PANEL WITH GFR
Anion gap: 11 (ref 5–15)
BUN: 36 mg/dL — ABNORMAL HIGH (ref 8–23)
CO2: 30 mmol/L (ref 22–32)
Calcium: 8.8 mg/dL — ABNORMAL LOW (ref 8.9–10.3)
Chloride: 91 mmol/L — ABNORMAL LOW (ref 98–111)
Creatinine, Ser: 1.8 mg/dL — ABNORMAL HIGH (ref 0.44–1.00)
GFR, Estimated: 27 mL/min — ABNORMAL LOW (ref >60)
Glucose, Bld: 185 mg/dL — ABNORMAL HIGH (ref 70–99)
Potassium: 4.2 mmol/L (ref 3.5–5.1)
Sodium: 132 mmol/L — ABNORMAL LOW (ref 135–145)

## 2023-05-19 LAB — CBC
HCT: 23.1 % — ABNORMAL LOW (ref 36.0–46.0)
Hemoglobin: 7.3 g/dL — ABNORMAL LOW (ref 12.0–15.0)
MCH: 29 pg (ref 26.0–34.0)
MCHC: 31.6 g/dL (ref 30.0–36.0)
MCV: 91.7 fL (ref 80.0–100.0)
Platelets: 187 10*3/uL (ref 150–400)
RBC: 2.52 MIL/uL — ABNORMAL LOW (ref 3.87–5.11)
RDW: 15.8 % — ABNORMAL HIGH (ref 11.5–15.5)
WBC: 10.8 10*3/uL — ABNORMAL HIGH (ref 4.0–10.5)
nRBC: 0 % (ref 0.0–0.2)

## 2023-05-19 LAB — PREPARE RBC (CROSSMATCH)

## 2023-05-19 MED ORDER — FUROSEMIDE 10 MG/ML IJ SOLN
40.0000 mg | Freq: Once | INTRAMUSCULAR | Status: AC
  Administered 2023-05-19: 40 mg via INTRAVENOUS
  Filled 2023-05-19: qty 4

## 2023-05-19 MED ORDER — LACTULOSE 10 GM/15ML PO SOLN
20.0000 g | Freq: Two times a day (BID) | ORAL | Status: DC
  Administered 2023-05-19 – 2023-06-06 (×15): 20 g via ORAL
  Filled 2023-05-19 (×34): qty 30

## 2023-05-19 MED ORDER — SODIUM CHLORIDE 0.9 % IV SOLN
200.0000 mg | INTRAVENOUS | Status: AC
  Administered 2023-05-19: 200 mg via INTRAVENOUS
  Filled 2023-05-19: qty 10

## 2023-05-19 MED ORDER — HYDRALAZINE HCL 50 MG PO TABS
50.0000 mg | ORAL_TABLET | Freq: Three times a day (TID) | ORAL | Status: DC
  Administered 2023-05-19 – 2023-05-21 (×4): 50 mg via ORAL
  Filled 2023-05-19 (×4): qty 1

## 2023-05-19 MED ORDER — SODIUM CHLORIDE 0.9% IV SOLUTION: Freq: Once | INTRAVENOUS | Status: AC

## 2023-05-19 NOTE — Progress Notes (Signed)
 PROGRESS NOTE    Mary Mccarthy  ZOX:[id number] DOB: July 16, 1940 DOA: 05/14/2023 PCP: Roslyn Coombe, MD  82/F w hypertension, hyperlipidemia, paroxysmal atrial fibrillation, aortic stenosis sp TAVR, T2DM and obesity who presented with dyspnea and lower extremity edema. In the ED, Hypoxic and Vol overloaded, cr 1,98  BNP 977, troponin 35 and 31 , CXR wpositive cardiomegaly, bilateral hilar vascular congestion with cephalization of the vasculature.  -Slow improvement with diuresis and treatment for reactive airway disease  Subjective: -Still having a lot of cough, breathing improving, off on 4 to 5 L of O2 this morning  Assessment and Plan:  Acute on chronic diastolic CHF  Pulmonary hypertension Acute hypoxic respiratory failure -Echo now with EF 60-65%, normal RV, moderately elevated PASP, TAVR -Diuresed with IV Lasix, volume status is improving, 5.4 L negative  -IV Lasix 6 held yesterday with bump in creatinine  -continue hydralazine, Imdur, metoprolol -Poor candidate for SGLT2i-history of multiple UTIs -GDMT limited by CKD -Increase activity, wean O2 -Lasix X1 following transfusion  Suspected COPD with mild exacerbation Bronchitis -Exposed to secondhand smoke for decades -Diffuse scattered rhonchi, productive cough -Continue DuoNebs, now off IV steroids, changed to prednisone taper -Flutter valve, Mucinex -Wean O2  Iron deficiency anemia -Hemoglobin down to 7.3, she denies overt blood loss -Given IV iron yesterday, transfuse 1 unit PRBC followed by Lasix  Persistent atrial fibrillation (HCC) Continue amiodarone and metoprolol.  Declines anticoagulation  Coronary artery disease, non-occlusive Stable, continue statin  SP TAVR 9/22  Hypertensive urgency Continue metoprolol, hydralazine and isosorbide.   CKD (chronic kidney disease), stage IV (HCC) -Now improving, monitor with diuresis  Type 2 diabetes mellitus with hyperlipidemia (HCC) -Started on Semglee,  has steroid-induced hyperglycemia  Anxiety with depression Continue mirtazapine and alprazolam.   Hypothyroidism Continue levothyroxine    DVT prophylaxis: lovenox Code Status: Full Code Family Communication: None present Disposition Plan: Home pending improvement  Consultants:   :    Objective: Vitals:   05/19/23 0400 05/19/23 0401 05/19/23 0402 05/19/23 0711  BP:   (!) 166/75 (!) 152/50  Pulse: 72 71 70 67  Resp: (!) 22 19 (!) 24 20  Temp:   97.6 F (36.4 C) 98.4 F (36.9 C)  TempSrc:   Oral Oral  SpO2: 92% 93% 94% 94%  Weight:   84.8 kg   Height:        Intake/Output Summary (Last 24 hours) at 05/19/2023 1118 Last data filed at 05/19/2023 8119 Gross per 24 hour  Intake 669.53 ml  Output 700 ml  Net -30.47 ml   Filed Weights   05/17/23 0413 05/18/23 0532 05/19/23 0402  Weight: 86.7 kg 83.2 kg 84.8 kg    Examination:  Gen: Awake, Alert, Oriented X 3,  HEENT: Positive JVD Lungs: Poor air movement, scattered rhonchi CVS: S1S2/RRR Abd: soft, Non tender, non distended, BS present Extremities: Trace edema Skin: no new rashes on exposed skin     Data Reviewed:   CBC: Recent Labs  Lab 05/14/23 0922 05/17/23 0320 05/18/23 0317 05/19/23 0306  WBC 5.2 7.0 7.2 10.8*  NEUTROABS 3.9  --   --   --   HGB 8.1* 7.4* 7.3* 7.3*  HCT 27.1* 23.8* 23.1* 23.1*  MCV 97.5 92.2 92.8 91.7  PLT 223 142* 159 187   Basic Metabolic Panel: Recent Labs  Lab 05/15/23 0301 05/16/23 0258 05/17/23 0320 05/17/23 0836 05/18/23 0317 05/19/23 0306  NA 135 134* 134*  --  132* 132*  K 3.9 4.1 3.3*  --  4.1 4.2  CL 98 96* 94*  --  94* 91*  CO2 23 27 33*  --  31 30  GLUCOSE 158* 152* 123*  --  266* 185*  BUN 30* 35* 28*  --  32* 36*  CREATININE 1.78* 1.92* 1.62*  --  2.17* 1.80*  CALCIUM 8.9 9.1 8.3*  --  8.2* 8.8*  MG  --  1.4*  --  2.0  --   --    GFR: Estimated Creatinine Clearance: 23 mL/min (A) (by C-G formula based on SCr of 1.8 mg/dL (H)). Liver Function  Tests: Recent Labs  Lab 05/14/23 0922  AST 58*  ALT 28  ALKPHOS 48  BILITOT 0.9  PROT 7.8  ALBUMIN 3.2*   Recent Labs  Lab 05/14/23 0922  LIPASE 19   No results for input(s): "AMMONIA" in the last 168 hours. Coagulation Profile: No results for input(s): "INR", "PROTIME" in the last 168 hours. Cardiac Enzymes: No results for input(s): "CKTOTAL", "CKMB", "CKMBINDEX", "TROPONINI" in the last 168 hours. BNP (last 3 results) Recent Labs    03/14/23 0924  PROBNP 235.0*   HbA1C: No results for input(s): "HGBA1C" in the last 72 hours. CBG: Recent Labs  Lab 05/18/23 0613 05/18/23 1133 05/18/23 1609 05/18/23 2050 05/19/23 0540  GLUCAP 252* 276* 233* 263* 174*   Lipid Profile: No results for input(s): "CHOL", "HDL", "LDLCALC", "TRIG", "CHOLHDL", "LDLDIRECT" in the last 72 hours. Thyroid Function Tests: No results for input(s): "TSH", "T4TOTAL", "FREET4", "T3FREE", "THYROIDAB" in the last 72 hours.  Anemia Panel: Recent Labs    05/17/23 0320  VITAMINB12 1,877*  FOLATE 7.5  FERRITIN 55  TIBC 293  IRON 17*  RETICCTPCT 1.4   Urine analysis:    Component Value Date/Time   COLORURINE YELLOW 05/14/2023 1148   APPEARANCEUR TURBID (A) 05/14/2023 1148   LABSPEC 1.014 05/14/2023 1148   PHURINE 5.0 05/14/2023 1148   GLUCOSEU NEGATIVE 05/14/2023 1148   GLUCOSEU NEGATIVE 03/15/2022 1217   HGBUR NEGATIVE 05/14/2023 1148   BILIRUBINUR NEGATIVE 05/14/2023 1148   KETONESUR NEGATIVE 05/14/2023 1148   PROTEINUR 30 (A) 05/14/2023 1148   UROBILINOGEN 0.2 03/15/2022 1217   NITRITE NEGATIVE 05/14/2023 1148   LEUKOCYTESUR NEGATIVE 05/14/2023 1148   Sepsis Labs: @LABRCNTIP (procalcitonin:4,lacticidven:4)  ) Recent Results (from the past 240 hours)  Resp panel by RT-PCR (RSV, Flu A&B, Covid) Anterior Nasal Swab     Status: None   Collection Time: 05/14/23  9:31 AM   Specimen: Anterior Nasal Swab  Result Value Ref Range Status   SARS Coronavirus 2 by RT PCR NEGATIVE NEGATIVE  Final   Influenza A by PCR NEGATIVE NEGATIVE Final   Influenza B by PCR NEGATIVE NEGATIVE Final    Comment: (NOTE) The Xpert Xpress SARS-CoV-2/FLU/RSV plus assay is intended as an aid in the diagnosis of influenza from Nasopharyngeal swab specimens and should not be used as a sole basis for treatment. Nasal washings and aspirates are unacceptable for Xpert Xpress SARS-CoV-2/FLU/RSV testing.  Fact Sheet for Patients: BloggerCourse.com  Fact Sheet for Healthcare Providers: SeriousBroker.it  This test is not yet approved or cleared by the [place]  FDA and has been authorized for detection and/or diagnosis of SARS-CoV-2 by FDA under an Emergency Use Authorization (EUA). This EUA will remain in effect (meaning this test can be used) for the duration of the COVID-19 declaration under Section 564(b)(1) of the Act, 21 U.S.C. section 360bbb-3(b)(1), unless the authorization is terminated or revoked.     Resp Syncytial Virus by PCR NEGATIVE NEGATIVE  Final    Comment: (NOTE) Fact Sheet for Patients: BloggerCourse.com  Fact Sheet for Healthcare Providers: SeriousBroker.it  This test is not yet approved or cleared by the [place]  FDA and has been authorized for detection and/or diagnosis of SARS-CoV-2 by FDA under an Emergency Use Authorization (EUA). This EUA will remain in effect (meaning this test can be used) for the duration of the COVID-19 declaration under Section 564(b)(1) of the Act, 21 U.S.C. section 360bbb-3(b)(1), unless the authorization is terminated or revoked.  Performed at [facility], [address] [place], [place] [address]      Radiology Studies: No results found.    Scheduled Meds:  sodium chloride   Intravenous Once   amiodarone  200 mg Oral Daily   atorvastatin  40 mg Oral Daily   enoxaparin (LOVENOX) injection  30 mg Subcutaneous Q24H    feeding supplement  237 mL Oral BID BM   fesoterodine  4 mg Oral Daily   furosemide  40 mg Intravenous Once   hydrALAZINE  50 mg Oral Q8H   insulin aspart  0-5 Units Subcutaneous QHS   insulin aspart  0-6 Units Subcutaneous TID WC   insulin glargine-yfgn  20 Units Subcutaneous Daily   ipratropium-albuterol  3 mL Nebulization QID   isosorbide mononitrate  60 mg Oral Daily   lactulose  20 g Oral BID   levothyroxine  25 mcg Oral Daily   metoprolol succinate  12.5 mg Oral Daily   mirtazapine  15 mg Oral QHS   pantoprazole  40 mg Oral BID   sodium chloride flush  3 mL Intravenous Q12H   Continuous Infusions:  iron sucrose       LOS: 5 days    Time spent:    Deforest Fast, MD [facility]   05/19/2023, 11:18 AM

## 2023-05-19 NOTE — Progress Notes (Signed)
 Progress Note  Patient Name: Mary Mccarthy Date of Encounter: 05/19/2023 Primary Cardiologist: Randene Bustard, MD   Subjective   Mr. Mary Mccarthy is an 83 year old with heart failure with preserved ejection fraction who presents with acute events requiring increased oxygen support.  She has heart failure with preserved ejection fraction and is on isosorbide mononitrate for afterload reduction. Her blood pressures have been elevated, ranging from 150s to 160s, with a wide pulse pressure. Despite diuresing over five liters, she continues to have significant oxygen demand, requiring four liters of oxygen, increased from her baseline of two liters at home.  She experiences multifactorial dyspnea, which has worsened recently, necessitating increased oxygen support. She required six liters of oxygen with mobility and is currently on four liters. Her goal is to reduce oxygen use to two liters for home use.  She has persistent atrial fibrillation and is currently in normal   Her chronic kidney disease is stage 4, with a GFR of 27. Her creatinine has slightly improved from 4.11 to 4.12, but her sodium remains low. She does not recall being diagnosed with CKD stage four in her life prior  Socially, she lives predominantly by herself and is fairly sedentary, rarely doing more than watching TV. Her goal of care is to resume being able to sit and be on a recliner.  She makes care decisions with her son; she is a full code.  Vital Signs    Vitals:   05/19/23 0400 05/19/23 0401 05/19/23 0402 05/19/23 0711  BP:   (!) 166/75 (!) 152/50  Pulse: 72 71 70 67  Resp: (!) 22 19 (!) 24 20  Temp:   97.6 F (36.4 C) 98.4 F (36.9 C)  TempSrc:   Oral Oral  SpO2: 92% 93% 94% 94%  Weight:   84.8 kg   Height:        Intake/Output Summary (Last 24 hours) at 05/19/2023 0908 Last data filed at 05/19/2023 1610 Gross per 24 hour  Intake 669.53 ml  Output 700 ml  Net -30.47 ml   Filed Weights    05/17/23 0413 05/18/23 0532 05/19/23 0402  Weight: 86.7 kg 83.2 kg 84.8 kg    Physical Exam   GEN: No acute distress.   Neck: Thick neck Cardiac: RRR, systolic crescendo murmur  Respiratory: Rhonchi bilaterally GI: Soft, nontender, non-distended  MS: trace bilateral edema  Labs   Telemetry: SR   Chemistry Recent Labs  Lab 05/14/23 0922 05/15/23 0301 05/17/23 0320 05/18/23 0317 05/19/23 0306  NA 135   < > 134* 132* 132*  K 4.1   < > 3.3* 4.1 4.2  CL 100   < > 94* 94* 91*  CO2 25   < > 33* 31 30  GLUCOSE 109*   < > 123* 266* 185*  BUN 24*   < > 28* 32* 36*  CREATININE 1.98*   < > 1.62* 2.17* 1.80*  CALCIUM 9.4   < > 8.3* 8.2* 8.8*  PROT 7.8  --   --   --   --   ALBUMIN 3.2*  --   --   --   --   AST 58*  --   --   --   --   ALT 28  --   --   --   --   ALKPHOS 48  --   --   --   --   BILITOT 0.9  --   --   --   --  GFRNONAA 24*   < > 31* 22* 27*  ANIONGAP 10   < > 7 7 11    < > = values in this interval not displayed.     Hematology Recent Labs  Lab 05/17/23 0320 05/18/23 0317 05/19/23 0306  WBC 7.0 7.2 10.8*  RBC 2.58*  2.58* 2.49* 2.52*  HGB 7.4* 7.3* 7.3*  HCT 23.8* 23.1* 23.1*  MCV 92.2 92.8 91.7  MCH 28.7 29.3 29.0  MCHC 31.1 31.6 31.6  RDW 15.8* 15.5 15.8*  PLT 142* 159 187    Cardiac Enzymes BNP Recent Labs  Lab 05/14/23 0922  BNP 977.8*      Cardiac Studies   Cardiac Studies & Procedures   ______________________________________________________________________________________________ CARDIAC CATHETERIZATION  CARDIAC CATHETERIZATION 09/29/2020  Narrative   Prox RCA lesion is 20% stenosed.   Mid RCA to Dist RCA lesion is 20% stenosed.   Ramus lesion is 30% stenosed.   Mid LAD lesion is 20% stenosed.   Dist LM to Prox LAD lesion is 20% stenosed.  Mild non-obstructive CAD Severe aortic stenosis (mean gradient 52.8 mmHg, peak to peak gradient 58 mmHg, AVA 0.90 cm2).  Recommendations: Will continue workup for  TAVR  Findings Coronary Findings Diagnostic  Dominance: Right  Left Main Dist LM to Prox LAD lesion is 20% stenosed.  Left Anterior Descending Vessel is large. Mid LAD lesion is 20% stenosed.  Ramus Intermedius Ramus lesion is 30% stenosed.  Right Coronary Artery Vessel is large. Prox RCA lesion is 20% stenosed. Mid RCA to Dist RCA lesion is 20% stenosed.  Intervention  No interventions have been documented.   CARDIAC CATHETERIZATION  CARDIAC CATHETERIZATION 05/21/2015  Narrative Images from the original result were not included. 1. Ost Ramus lesion, 60% stenosed. 2. Ost RCA lesion, 30% stenosed. 3. Dist LAD lesion, tapers to a small vessel with diffuse roughly 40-50% stenosis. 4. The left ventricular systolic function is normal. 5. Elevated LVEDP  No culprit lesion noted to explain the patient's elevated troponin. The most significant lesion in the ostial ramus intermedius. This is not PCI target  She has elevated LVEDP, but relatively preserved EF.  Plan:  Return to TCU for monitoring and continued care.  TR band removal per protocol.  Defer further cardiac evaluation and management to Dr. Maximo Spar.    Arleen Lacer, M.D., M.S. Interventional Cardiologist  Pager # [phone] Phone # [phone] [address] [place], [place] [address]  Findings Coronary Findings Diagnostic  Dominance: Right  Left Main . Vessel is large.  Left Anterior Descending . Vessel is large. Tapers to relatively small caliber vessel distally with diffuse apical disease Diffuse. Vessel is a relatively small at this level with diffuse disease  First Diagonal Branch The vessel is moderate in size and is angiographically normal.  Lateral First Diagonal Branch The vessel is small in size.  First Septal Branch The vessel is small in size.  Second Septal Branch The vessel is small in size.  Third Septal Branch The vessel is small in size.  Ramus  Intermedius . Vessel is moderate in size. Discrete located at the major branch.  Lateral Ramus Intermedius The vessel is small in size.  Left Circumflex . Vessel is angiographically normal.  First Obtuse Marginal Branch The vessel is angiographically normal.  Lateral First Obtuse Marginal Branch The vessel is small in size.  Right Coronary Artery . Vessel is large. Discrete.  Acute Marginal Branch The vessel is moderate in size.  First Right Posterolateral Branch The vessel is  small in size.  Third Right Posterolateral Branch The vessel is small in size.  Intervention  No interventions have been documented.     ECHOCARDIOGRAM  ECHOCARDIOGRAM COMPLETE 05/15/2023  Narrative ECHOCARDIOGRAM REPORT    Patient Name:   JENNFIER ABDULLA Date of Exam: 05/15/2023 Medical Rec #:  [id number]                Height:       61.0 in Accession #:    [id number]               Weight:       194.9 lb Date of Birth:  1940/03/20                BSA:          1.868 m Patient Age:    82 years                 BP:           155/126 mmHg Patient Gender: F                        HR:           61 bpm. Exam Location:  Inpatient  Procedure: 2D Echo, Cardiac Doppler and Color Doppler (Both Spectral and Color Flow Doppler were utilized during procedure).  Indications:    I50.40* Unspecified combined systolic (congestive) and diastolic (congestive) heart failure  History:        Patient has prior history of Echocardiogram examinations, most recent 11/13/2022. CHF, CAD, Stroke, Aortic Valve Disease; Risk Factors:Hypertension, Dyslipidemia and Diabetes. TAVR procedure. Aortic Valve: 26 mm Medtronic stented (TAVR) valve is present in the aortic position. Procedure Date: 10/26/2020.  Sonographer:    Raynelle Callow RDCS Referring Phys: [id number] RONDELL A SMITH   Sonographer Comments: Study delayed, patient in chair IMPRESSIONS   1. Left ventricular ejection fraction, by estimation, is 60 to  65%. The left ventricle has normal function. The left ventricle has no regional wall motion abnormalities. There is severe asymmetric left ventricular hypertrophy of the septal segment. Left ventricular diastolic function could not be evaluated. 2. Right ventricular systolic function is normal. The right ventricular size is mildly enlarged. There is moderately elevated pulmonary artery systolic pressure. The estimated right ventricular systolic pressure is 53.7 mmHg. 3. Left atrial size was mildly dilated. 4. Right atrial size was mildly dilated. 5. The mitral valve is degenerative. Trivial mitral valve regurgitation. Mild mitral stenosis. The mean mitral valve gradient is 3.0 mmHg. Severe mitral annular calcification. 6. Tricuspid valve regurgitation is mild to moderate. 7. Mild paravalvular leak adjacent to the intervalvular fibrosa. The aortic valve has been repaired/replaced. Aortic valve regurgitation is mild. No aortic stenosis is present. There is a 26 mm Medtronic stented (TAVR) valve present in the aortic position. Procedure Date: 10/26/2020. Echo findings are consistent with normal structure and function of the aortic valve prosthesis. Echo findings are consistent with perivalvular leak of the aortic prosthesis. Aortic regurgitation PHT measures 514 msec. Aortic valve mean gradient measures 9.0 mmHg. Aortic valve Vmax measures 1.88 m/s. 8. The inferior vena cava is dilated in size with <50% respiratory variability, suggesting right atrial pressure of 15 mmHg.  Comparison(s): Prior images reviewed side by side. PVL is more prominent, stable RVSP.  FINDINGS Left Ventricle: Left ventricular ejection fraction, by estimation, is 60 to 65%. The left ventricle has normal function. The left ventricle has no regional wall motion abnormalities.  Strain was performed and the global longitudinal strain is indeterminate. The left ventricular internal cavity size was normal in size. There is severe  asymmetric left ventricular hypertrophy of the septal segment. Left ventricular diastolic function could not be evaluated due to mitral annular calcification (moderate or greater). Left ventricular diastolic function could not be evaluated.  Right Ventricle: The right ventricular size is mildly enlarged. No increase in right ventricular wall thickness. Right ventricular systolic function is normal. There is moderately elevated pulmonary artery systolic pressure. The tricuspid regurgitant velocity is 3.11 m/s, and with an assumed right atrial pressure of 15 mmHg, the estimated right ventricular systolic pressure is 53.7 mmHg.  Left Atrium: Left atrial size was mildly dilated.  Right Atrium: Right atrial size was mildly dilated.  Pericardium: There is no evidence of pericardial effusion.  Mitral Valve: The mitral valve is degenerative in appearance. Severe mitral annular calcification. Trivial mitral valve regurgitation. Mild mitral valve stenosis. MV peak gradient, 11.8 mmHg. The mean mitral valve gradient is 3.0 mmHg with average heart rate of 59 bpm.  Tricuspid Valve: The tricuspid valve is normal in structure. Tricuspid valve regurgitation is mild to moderate. No evidence of tricuspid stenosis.  Aortic Valve: Mild paravalvular leak adjacent to the intervalvular fibrosa. The aortic valve has been repaired/replaced. Aortic valve regurgitation is mild. Aortic regurgitation PHT measures 514 msec. No aortic stenosis is present. Aortic valve mean gradient measures 9.0 mmHg. Aortic valve peak gradient measures 14.1 mmHg. Aortic valve area, by VTI measures 1.92 cm. There is a 26 mm Medtronic stented (TAVR) valve present in the aortic position. Procedure Date: 10/26/2020. Echo findings are consistent with normal structure and function of the aortic valve prosthesis.  Pulmonic Valve: The pulmonic valve was normal in structure. Pulmonic valve regurgitation is not visualized. No evidence of pulmonic  stenosis.  Aorta: The aortic root and ascending aorta are structurally normal, with no evidence of dilitation.  Venous: The inferior vena cava is dilated in size with less than 50% respiratory variability, suggesting right atrial pressure of 15 mmHg.  IAS/Shunts: The atrial septum is grossly normal.  Additional Comments: 3D was performed not requiring image post processing on an independent workstation and was indeterminate.   LEFT VENTRICLE PLAX 2D LVIDd:         4.60 cm     Diastology LVIDs:         2.90 cm     LV e' medial:    5.55 cm/s LV PW:         1.10 cm     LV E/e' medial:  25.9 LV IVS:        1.50 cm     LV e' lateral:   6.64 cm/s LVOT diam:     2.00 cm     LV E/e' lateral: 21.7 LV SV:         81 LV SV Index:   43 LVOT Area:     3.14 cm  LV Volumes (MOD) LV vol d, MOD A2C: 84.9 ml LV vol d, MOD A4C: 68.3 ml LV vol s, MOD A2C: 27.4 ml LV vol s, MOD A4C: 31.5 ml LV SV MOD A2C:     57.5 ml LV SV MOD A4C:     68.3 ml LV SV MOD BP:      46.8 ml  RIGHT VENTRICLE             IVC RV S prime:     10.90 cm/s  IVC diam: 2.70 cm TAPSE (  M-mode): 2.2 cm  LEFT ATRIUM             Index        RIGHT ATRIUM           Index LA diam:        5.20 cm 2.78 cm/m   RA Area:     21.80 cm LA Vol (A2C):   54.6 ml 29.23 ml/m  RA Volume:   64.90 ml  34.74 ml/m LA Vol (A4C):   62.4 ml 33.40 ml/m LA Biplane Vol: 59.4 ml 31.80 ml/m AORTIC VALVE AV Area (Vmax):    1.93 cm AV Area (Vmean):   1.83 cm AV Area (VTI):     1.92 cm AV Vmax:           187.67 cm/s AV Vmean:          122.000 cm/s AV VTI:            0.420 m AV Peak Grad:      14.1 mmHg AV Mean Grad:      9.0 mmHg LVOT Vmax:         115.00 cm/s LVOT Vmean:        71.000 cm/s LVOT VTI:          0.257 m LVOT/AV VTI ratio: 0.61 AI PHT:            514 msec  AORTA Ao Root diam: 3.30 cm Ao Asc diam:  3.25 cm  MITRAL VALVE                TRICUSPID VALVE MV Area (PHT): 4.60 cm     TR Peak grad:   38.7 mmHg MV Area VTI:    1.66 cm     TR Vmax:        311.00 cm/s MV Peak grad:  11.8 mmHg MV Mean grad:  3.0 mmHg     SHUNTS MV Vmax:       1.72 m/s     Systemic VTI:  0.26 m MV Vmean:      74.7 cm/s    Systemic Diam: 2.00 cm MV Decel Time: 165 msec MV E velocity: 144.00 cm/s MV A velocity: 71.80 cm/s MV E/A ratio:  2.01  Gloriann Larger MD Electronically signed by Gloriann Larger MD Signature Date/Time: 05/15/2023/5:44:08 PM    Final    MONITORS  LONG TERM MONITOR-LIVE TELEMETRY (3-14 DAYS) 11/19/2020  Narrative  Predominantly sinus rhythm with minimum heart rate of 49 bpm, maximum 114 bpm. Average 68 bpm.  Total of 206 (Nonsustained Ventricular Tachycardia-V. tach fastest 5 beats continue 39 bpm. Longest 9.3 seconds average rate 1 3 5  bpm.  A. fib burden (16%) rate range 62-139 bpm with average rate 105 bpm. Longest was for 17 hr 55 min. Some aberrant conduction suspected.  Rare isolated PACs and PVCs. Some PVC couplets and triplets were noted.  No high-grade AV block noted.  Bursts of V. tach could be A. fib with aberrancy.   Patch Wear Time:  12 days and 19 hours (2022-09-22T09:52:09-398 to 2022-10-05T05:32:17-0400)  Abnormal monitor.  Still shows a significant mount of atrial fibrillation and short burst of nonsustained V. tach.  Thankfully, no high-grade AV block.  I think we can continue amiodarone and probably titrate up beta-blocker.   Randene Bustard, MD   CT SCANS  CT CORONARY MORPH W/CTA COR W/SCORE 09/28/2020  Addendum 09/29/2020  1:17 PM ADDENDUM REPORT: 09/29/2020 13:14  CLINICAL DATA:  Pre-op transcatheter aortic valve replacement (TAVR)  EXAM: Cardiac TAVR CT  TECHNIQUE: The patient was scanned on a Siemens Force 192 slice scanner. A 120 kV retrospective scan was triggered in the descending thoracic aorta at 111 HU's. Gantry rotation speed was 270 msecs and collimation was .9 mm. The 3D data set was reconstructed in 5% intervals of the R-R cycle. Systolic  and diastolic phases were analyzed on a dedicated work station using MPR, MIP and VRT modes. The patient received 95mL OMNIPAQUE IOHEXOL 350 MG/ML SOLN of contrast.  FINDINGS: Aortic Valve: Tricuspid aortic valve. Severely reduced cusp separation. Severely thickened, moderately calcified aortic valve cusps.  AV calcium score: 1049  Virtual Basal Annulus Measurements:  Maximum/Minimum Diameter: 23.1 mm x 18.9 mm  Perimeter: 65.4 mm  Area: 328 mm2  No significant LVOT calcifications.  Based on these measurements, the annulus would be suitable for a 20 mm Edwards Sapien 3 valve vs 23 mm Medtronic Evolut Pro valve. Sinus dimensions are borderline for 26 mm Medtronic Evolut Pro valve. Recommend Heart Team discussion for valve sizing.  Sinus of Valsalva Measurements:  Non-coronary:  26 mm  Right - coronary:  26 mm  Left - coronary:  27 mm  Sinus of Valsalva Height:  Left: 17.5 mm  Right: 18.6 mm  Aorta: Mild aortic atherosclerosis.  Sinotubular Junction: 27 mm  Ascending Thoracic Aorta:  31 mm  Aortic Arch:  24 mm  Descending Thoracic Aorta:  22 mm  Coronary Artery Height above Annulus:  Left Main: 13 mm  Right Coronary: 11.6 mm  Coronary Arteries: Normal origins. 3 vessel coronary artery calcifications.  Optimum Fluoroscopic Angle for Delivery: RAO 1, CRA 1  Moderate mitral annular calcification.  No left atrial appendage thrombus.  IMPRESSION: 1. Tricuspid aortic valve. Severely reduced cusp separation. Severely thickened, moderately calcified aortic valve cusps.  2.  AV calcium score: 1049  3. Annulus area: 328 mm2, no significant LVOT calcifications. Based on these measurements, the annulus would be suitable for a 20 mm Edwards Sapien 3 valve vs 23 mm Medtronic Evolut Pro valve. Sinus dimensions are borderline for 26 mm Medtronic Evolut Pro valve. Recommend Heart Team discussion for valve sizing.  4.  Sufficient coronary artery height from  annulus.  5.  Optimum Fluoroscopic Angle for Delivery: RAO 1, CRA 1   Electronically Signed By: Grady Lawman M.D. On: 09/29/2020 13:14  Narrative EXAM: OVER-READ INTERPRETATION  CT CHEST  The following report is an over-read performed by radiologist Dr. Alexandria Angel of [facility] on 09/29/2020. This over-read does not include interpretation of cardiac or coronary anatomy or pathology. The coronary calcium score/coronary CTA interpretation by the cardiologist is attached.  COMPARISON:  Cardiac CTA 10/09/2019.  FINDINGS: Extracardiac findings will be described separately under dictation for contemporaneously obtained CTA chest, abdomen and pelvis.  IMPRESSION: Please see separate dictation for contemporaneously obtained CTA chest, abdomen and pelvis dated 09/28/2020 for full description of relevant extracardiac findings.  Electronically Signed: By: Alexandria Angel M.D. On: 09/29/2020 06:07   CT SCANS  CT CORONARY FRACTIONAL FLOW RESERVE DATA PREP 10/09/2019  Narrative EXAM: CT FFR ANALYSIS  CLINICAL DATA:  Chest pain/anginal equiv, 41yr CHD risk 10-20%, not treadmill candidate  FINDINGS: FFRct analysis was performed on the original cardiac CT angiogram dataset. Diagrammatic representation of the FFRct analysis is provided in a separate PDF document in PACS. This dictation was created using the PDF document and an interactive 3D model of the results. 3D model is not available in the EMR/PACS. Normal FFR range is >0.80.  1. Left Main:  No significant stenosis. FFR = 0.99  2. LAD: No significant stenosis. Proximal FFR = 0.97, Mid FFR = 0.90, Distal FFR = 0.85 3. LCX: No significant stenosis. Proximal FFR = 0.98, Distal FFR = 0.91 4. RCA: No significant stenosis. Proximal FFR = 0.96, Mid FFR = 0.92, Distal FFR = 0.91  IMPRESSION: 1.  CT FFR analysis did not show any significant stenosis.   Electronically Signed By: Hazle Lites  M.D. On: 10/10/2019 23:18   CT CORONARY MORPH W/CTA COR W/SCORE 10/09/2019  Addendum 10/09/2019  9:12 PM ADDENDUM REPORT: 10/09/2019 21:10  HISTORY: 83 yo female with chest pain/anginal equiv, 80yr CHD risk 10-20%, not treadmill candidate  EXAM: Cardiac/Coronary CTA  TECHNIQUE: The patient was scanned on a [employer].  PROTOCOL: A 120 kV prospective scan was triggered in the descending thoracic aorta at 111 HU's. Axial non-contrast 3 mm slices were carried out through the heart. The data set was analyzed on a dedicated work station and scored using the Agatson method. Gantry rotation speed was 250 msecs and collimation was .6 mm. Beta blockade and 0.8 mg of sl NTG was given. The 3D data set was reconstructed in 5% intervals of the 67-82 % of the R-R cycle. Diastolic phases were analyzed on a dedicated work station using MPR, MIP and VRT modes. The patient received 80mL OMNIPAQUE IOHEXOL 350 MG/ML SOLN of contrast.  FINDINGS: Quality: Fair, HR 59  Coronary calcium score: The patient's coronary artery calcium score is 624, which places the patient in the 82nd percentile.  Coronary arteries: Normal coronary origins.  Right dominance.  Right Coronary Artery: Dominant. Gives off large R-PDA and R-PLB branches. Mild 25-49% proximal mixed stenosis (CADRADS 2). No significant distal or branch disease.  Left Main Coronary Artery: Normal left main. Bifurcates into the LAD and LCx branches.  Left Anterior Descending Coronary Artery: Moderate sized anterior artery which gives off a mid-vessel diagonal branch. There is diffuse calcified plaque with mild 25-49% proximal stenosis (CADRADS3) and minimal mixed mid to distal vessel stenosis (<25% - CADRADS1).  Ramus intermedius: Small branch <2.0 mm, mild to moderate ostial disease  Left Circumflex Artery: AV groove vessel. There is a moderate mixed 50-69% proximal stenosis (CADRADS3). Small distal OM branch  without disease.  Aorta: Normal size, 30 mm at the mid ascending aorta (level of the PA bifurcation) measured double oblique. Aortic atherosclerosis. No dissection.  Aortic Valve: Trileaflet with leaflet and annular calcification. Probable aortic stenosis.  Other findings:  Normal pulmonary vein drainage into the left atrium.  Normal left atrial appendage without a thrombus.  Dilated main pulmonary artery to 28 mm, suggestive of pulmonary hypertension.  Mitral annular calcification  IMPRESSION: 1. Diffuse multivessel mixed CAD, possibly significant in the proximal LCx, CADRADS = 3. CT FFR will be performed and reported separately.  2. Coronary calcium score of 624. This was 82nd percentile for age and sex matched control.  3. Normal coronary origin with right dominance.  4. Dilated main pulmonary artery to 28 mm, suggestive of pulmonary hypertension.  5. Mitral annular calcification  6. Aortic annular and leaflet calcification, probable aortic stenosis   Electronically Signed By: Hazle Lites M.D. On: 10/09/2019 21:10  Narrative EXAM: OVER-READ INTERPRETATION  CT CHEST  The following report is an over-read performed by radiologist Dr. Alexandria Angel of [facility] on 10/09/2019. This over-read does not include interpretation of cardiac or coronary anatomy or pathology. The coronary calcium score/coronary CTA interpretation by the cardiologist  is attached.  COMPARISON:  None.  FINDINGS: Aortic atherosclerosis. Within the visualized portions of the thorax there are no suspicious appearing pulmonary nodules or masses, there is no acute consolidative airspace disease, no pleural effusions, no pneumothorax and no lymphadenopathy. Visualized portions of the upper abdomen are unremarkable. There are no aggressive appearing lytic or blastic lesions noted in the visualized portions of the skeleton.  IMPRESSION: 1.  Aortic Atherosclerosis  (ICD10-I70.0).  Electronically Signed: By: Alexandria Angel M.D. On: 10/09/2019 15:23     ______________________________________________________________________________________________         Assessment & Plan   Heart failure with preserved ejection fraction (acute on chronic) She is experiencing multifactorial dyspnea and requires six liters of oxygen with mobility. Despite diuresis of over five liters, significant oxygen demand persists. BNP levels will be monitored to assess her condition. If unable to wean off oxygen today, a repeat BNP will be obtained to decide on home PO diuretic dose. - Increase hydralazine dose - AM BNP draw, DC on some dose of torsemide - wean O2 - no MRA (CKD 4), no SGLT2i due to UTIs  Persistent atrial fibrillation Persistent atrial fibrillation managed with amiodarone 200 mg. She is currently in normal rhythm. A low dose of metoprolol succinate is also being used for rate control. - Continue amiodarone 200 mg - Continue low dose metoprolol succinate  Hypertension Blood pressures remain elevated with readings in the 150s to 160s, with evidence of a wide pulse pressure. Hypertension is being managed with isosorbide mononitrate and hydralazine.  Chronic kidney disease stage 4 She has CKD stage 4 with a GFR of 27. Her creatinine has improved slightly from 4.11 to 4.12. Sodium levels are low, which may be related to volume status. There are no current plans for dialysis, leaving the management of her CKD in a challenging situation.  Nonobstructive coronary artery disease Mild nonobstructive coronary artery disease with secondary demand ischemia. She is currently on a statin for management. - Continue current statin therapy  Iron deficiency anemia Iron deficiency anemia is being managed with IV iron therapy. A blood transfusion may be considered if necessary. - Consider blood transfusion if needed  Goals of Care She is 83 years old, lives  predominantly by herself, and is fairly sedentary. Her goal of care is to resume being able to sit and be on a recliner, performing basic ADLs. She is full code.  We have discussed some potential discrepancies in her long term planning but will make no changes to respect her wishes  Follow-up The goal is for her to be discharged tomorrow with close follow-up with Doctor Harding's team. (F/u scheduled 4/18 then 6/3)   For questions or updates, please contact [facility] Please consult [email or web address] for contact info under Cardiology/STEMI.      Gloriann Larger, MD FASE [facility] Cardiologist [facility]  [address] [place], [address] [place], [place] [address] [phone]  9:08 AM

## 2023-05-19 NOTE — Progress Notes (Signed)
 Mobility Specialist Progress Note:   05/19/23 1116  Mobility  Activity Ambulated with assistance in room;Transferred from bed to chair  Level of Assistance Minimal assist, patient does 75% or more  Assistive Device Front wheel walker  Distance Ambulated (ft) 30 ft  Activity Response Tolerated well  Mobility Referral Yes  Mobility visit 1 Mobility  Mobility Specialist Start Time (ACUTE ONLY) 1116  Mobility Specialist Stop Time (ACUTE ONLY) 1126  Mobility Specialist Time Calculation (min) (ACUTE ONLY) 10 min   Pt received in chair. Agreeable to ambulate in room. MinA required to stand from chair. CGA during ambulation. Tolerated well, VSS throughout. Pt requested to return to bed. Lying comfortably in bed with all needs met, alarm on and call bell in reach.    Monica Codd Mobility Specialist Please contact via Special educational needs teacher or  Rehab office at [phone]

## 2023-05-19 NOTE — Plan of Care (Signed)
  Problem: Clinical Measurements: Goal: Ability to maintain clinical measurements within normal limits will improve Outcome: Progressing   Problem: Health Behavior/Discharge Planning: Goal: Ability to manage health-related needs will improve Outcome: Progressing   Problem: Education: Goal: Knowledge of General Education information will improve Description: Including pain rating scale, medication(s)/side effects and non-pharmacologic comfort measures Outcome: Progressing   Problem: Clinical Measurements: Goal: Will remain free from infection Outcome: Progressing   Problem: Clinical Measurements: Goal: Diagnostic test results will improve Outcome: Progressing   Problem: Clinical Measurements: Goal: Respiratory complications will improve Outcome: Progressing   Problem: Clinical Measurements: Goal: Cardiovascular complication will be avoided Outcome: Progressing   Problem: Activity: Goal: Risk for activity intolerance will decrease Outcome: Progressing

## 2023-05-20 ENCOUNTER — Inpatient Hospital Stay (HOSPITAL_COMMUNITY)

## 2023-05-20 DIAGNOSIS — I5033 Acute on chronic diastolic (congestive) heart failure: Secondary | ICD-10-CM | POA: Diagnosis not present

## 2023-05-20 LAB — CBC WITH DIFFERENTIAL/PLATELET
Abs Immature Granulocytes: 0.06 10*3/uL (ref 0.00–0.07)
Basophils Absolute: 0 10*3/uL (ref 0.0–0.1)
Basophils Relative: 0 %
Eosinophils Absolute: 0 10*3/uL (ref 0.0–0.5)
Eosinophils Relative: 0 %
HCT: 27.1 % — ABNORMAL LOW (ref 36.0–46.0)
Hemoglobin: 8.8 g/dL — ABNORMAL LOW (ref 12.0–15.0)
Immature Granulocytes: 1 %
Lymphocytes Relative: 9 %
Lymphs Abs: 0.8 10*3/uL (ref 0.7–4.0)
MCH: 29.2 pg (ref 26.0–34.0)
MCHC: 32.5 g/dL (ref 30.0–36.0)
MCV: 90 fL (ref 80.0–100.0)
Monocytes Absolute: 0.7 10*3/uL (ref 0.1–1.0)
Monocytes Relative: 8 %
Neutro Abs: 6.8 10*3/uL (ref 1.7–7.7)
Neutrophils Relative %: 82 %
Platelets: 161 10*3/uL (ref 150–400)
RBC: 3.01 MIL/uL — ABNORMAL LOW (ref 3.87–5.11)
RDW: 16 % — ABNORMAL HIGH (ref 11.5–15.5)
WBC: 8.3 10*3/uL (ref 4.0–10.5)
nRBC: 0 % (ref 0.0–0.2)

## 2023-05-20 LAB — BRAIN NATRIURETIC PEPTIDE: B Natriuretic Peptide: 834.1 pg/mL — ABNORMAL HIGH (ref 0.0–100.0)

## 2023-05-20 LAB — TYPE AND SCREEN
ABO/RH(D): A POS
Antibody Screen: NEGATIVE
Unit division: 0

## 2023-05-20 LAB — BASIC METABOLIC PANEL WITH GFR
Anion gap: 7 (ref 5–15)
BUN: 39 mg/dL — ABNORMAL HIGH (ref 8–23)
CO2: 33 mmol/L — ABNORMAL HIGH (ref 22–32)
Calcium: 8.6 mg/dL — ABNORMAL LOW (ref 8.9–10.3)
Chloride: 92 mmol/L — ABNORMAL LOW (ref 98–111)
Creatinine, Ser: 1.74 mg/dL — ABNORMAL HIGH (ref 0.44–1.00)
GFR, Estimated: 29 mL/min — ABNORMAL LOW (ref >60)
Glucose, Bld: 109 mg/dL — ABNORMAL HIGH (ref 70–99)
Potassium: 4.2 mmol/L (ref 3.5–5.1)
Sodium: 132 mmol/L — ABNORMAL LOW (ref 135–145)

## 2023-05-20 LAB — GLUCOSE, CAPILLARY
Glucose-Capillary: 114 mg/dL — ABNORMAL HIGH (ref 70–99)
Glucose-Capillary: 94 mg/dL (ref 70–99)
Glucose-Capillary: 83 mg/dL (ref 70–99)
Glucose-Capillary: 99 mg/dL (ref 70–99)

## 2023-05-20 LAB — BPAM RBC
Blood Product Expiration Date: 202505122359
ISSUE DATE / TIME: 202504121452
Unit Type and Rh: 6200

## 2023-05-20 LAB — PROCALCITONIN: Procalcitonin: 0.36 ng/mL

## 2023-05-20 MED ORDER — FUROSEMIDE 10 MG/ML IJ SOLN
20.0000 mg | Freq: Once | INTRAMUSCULAR | Status: DC
Start: 2023-05-20 — End: 2023-05-20

## 2023-05-20 MED ORDER — TORSEMIDE 20 MG PO TABS: 40.0000 mg | ORAL_TABLET | Freq: Two times a day (BID) | ORAL | Status: DC

## 2023-05-20 MED ORDER — FUROSEMIDE 10 MG/ML IJ SOLN: 40.0000 mg | Freq: Two times a day (BID) | INTRAMUSCULAR | Status: DC

## 2023-05-20 MED ORDER — FUROSEMIDE 10 MG/ML IJ SOLN
40.0000 mg | Freq: Once | INTRAMUSCULAR | Status: AC
  Administered 2023-05-20: 40 mg via INTRAVENOUS
  Filled 2023-05-20: qty 4

## 2023-05-20 MED ORDER — FUROSEMIDE 10 MG/ML IJ SOLN
80.0000 mg | Freq: Two times a day (BID) | INTRAMUSCULAR | Status: AC
  Administered 2023-05-20 (×2): 80 mg via INTRAVENOUS
  Filled 2023-05-20: qty 8

## 2023-05-20 MED ORDER — SODIUM CHLORIDE 0.9 % IV SOLN
3.0000 g | Freq: Two times a day (BID) | INTRAVENOUS | Status: DC
  Administered 2023-05-20 – 2023-05-23 (×7): 3 g via INTRAVENOUS
  Filled 2023-05-20 (×7): qty 8

## 2023-05-20 NOTE — Plan of Care (Signed)
  Problem: Clinical Measurements: Goal: Ability to maintain clinical measurements within normal limits will improve Outcome: Progressing Goal: Respiratory complications will improve Outcome: Progressing   Problem: Safety: Goal: Ability to remain free from injury will improve Outcome: Progressing   

## 2023-05-20 NOTE — Progress Notes (Signed)
 Per report from the night RN . Pt was placed on Bipap from  2 am this morning as pt was desatting to the low 80's.    Pt is still on Bipap sating @92 . Pt is Lethargic. All po meds for this morning was held. MD Drexel Gentles is aware. Pt sounds really wet, MD notified . MD Drexel Gentles ordered a CXR.

## 2023-05-20 NOTE — Progress Notes (Signed)
 Pharmacy Antibiotic Note  Mary Mccarthy is a 83 y.o. female admitted on 05/14/2023 with heart failure exacerbation. Now with suspicion of aspiration pneumonia.  Pharmacy has been consulted for Unasyn dosing.  WBC 8.3, Scr 1.74 (eCrCl 24), PCT 0.36, afebrile, RR 33 Resp panel negative   Plan: Unasyn 3g IV every 12 hours Monitor CBC, renal function, cultures, and signs of clinical improvement Follow for duration of treatment  Height: 5\' 1"  (154.9 cm) Weight: 85.4 kg (188 lb 4.4 oz) IBW/kg (Calculated) : 47.8  Temp (24hrs), Avg:98.6 F (37 C), Min:98.3 F (36.8 C), Max:99.5 F (37.5 C)  Recent Labs  Lab 05/14/23 0922 05/14/23 1051 05/14/23 1346 05/15/23 0301 05/16/23 0258 05/17/23 0320 05/18/23 0317 05/19/23 0306 05/20/23 0305  WBC 5.2  --   --   --   --  7.0 7.2 10.8* 8.3  CREATININE 1.98*  --   --    < > 1.92* 1.62* 2.17* 1.80* 1.74*  LATICACIDVEN  --  2.0* 1.4  --   --   --   --   --   --    < > = values in this interval not displayed.    Estimated Creatinine Clearance: 23.9 mL/min (A) (by C-G formula based on SCr of 1.74 mg/dL (H)).    Allergies  Allergen Reactions   Ibuprofen Other (See Comments)    Bleeding events    Antimicrobials this admission: 4/13 unasyn >>   Thank you for allowing pharmacy to be a part of this patient's care.  Jerrel Mor, PharmD PGY1 Pharmacy Resident 05/20/2023 9:44 AM

## 2023-05-20 NOTE — Progress Notes (Addendum)
 PROGRESS NOTE    Mary Mccarthy  ZOX:[id number] DOB: 05/04/1940 DOA: 05/14/2023 PCP: Mary Coombe, MD  82/F w hypertension, hyperlipidemia, paroxysmal atrial fibrillation, aortic stenosis sp TAVR, T2DM and obesity who presented with dyspnea and lower extremity edema. In the ED, Hypoxic and Vol overloaded, cr 1,98  BNP 977, troponin 35 and 31 , CXR wpositive cardiomegaly, bilateral hilar vascular congestion with cephalization of the vasculature.  -Slow improvement with diuresis and treatment for reactive airway disease -4/13: Overnight with worsening hypoxia and tachypnea, distress  Subjective: -placed on BIPAP overnight  Assessment and Plan:  Acute on chronic diastolic CHF  Pulmonary hypertension Acute hypoxic respiratory failure -Echo now with EF 60-65%, normal RV, moderately elevated PASP, TAVR -Diuresed with IV Lasix, volume status is improving, 6 L negative  -repeat IV lasix today -continue hydralazine, Imdur, metoprolol -Poor candidate for SGLT2i-history of multiple UTIs -GDMT limited by CKD -worsening hypoxia, overnight Xray w/ B/L infiltrates, ? Aspiration: start IV Unasyn, check SLP  Suspected COPD with mild exacerbation Bronchitis -Exposed to secondhand smoke for decades -Diffuse scattered rhonchi, productive cough -Continue DuoNebs, now off IV steroids, changed to prednisone taper yesterday, continue pulm toilet-Flutter valve, Mucinex -Wean O2 -starting Abx, see discussion above  Iron deficiency anemia -Hemoglobin down to 7.3, she denies overt blood loss -Given IV iron , transfused 1 unit PRBC yesterday  Persistent atrial fibrillation (HCC) Continue amiodarone and metoprolol.  Declines anticoagulation  Coronary artery disease, non-occlusive Stable, continue statin  SP TAVR 9/22  Hypertensive urgency Continue metoprolol, hydralazine and isosorbide.   CKD (chronic kidney disease), stage IV (HCC) -Now improving, monitor with diuresis  Type 2  diabetes mellitus with hyperlipidemia (HCC) -Started on Semglee, has steroid-induced hyperglycemia  Anxiety with depression Continue mirtazapine and alprazolam.   Hypothyroidism Continue levothyroxine    DVT prophylaxis: lovenox Code Status: discussed w/ Pt, she wishes to be DNR now Family Communication:son at bedside Disposition Plan: Home pending improvement  Consultants:   :    Objective: Vitals:   05/20/23 0710 05/20/23 0816 05/20/23 1122 05/20/23 1415  BP: (!) 160/53  (!) 151/59 (!) 166/55  Pulse: 68  67 83  Resp: 20 (!) 33 (!) 22 18  Temp: 99.1 F (37.3 C)  99.5 F (37.5 C)   TempSrc: Oral  Axillary   SpO2: 96%  90% 90%  Weight:      Height:        Intake/Output Summary (Last 24 hours) at 05/20/2023 1544 Last data filed at 05/20/2023 1031 Gross per 24 hour  Intake 558 ml  Output 775 ml  Net -217 ml   Filed Weights   05/18/23 0532 05/19/23 0402 05/20/23 0407  Weight: 83.2 kg 84.8 kg 85.4 kg    Examination:  Gen: Awake, Alert, Oriented X 2, on bipap HEENT: Positive JVD Lungs: Poor air movement, scattered rhonchi and rales CVS: S1S2/RRR Abd: soft, Non tender, non distended, BS present Extremities: Trace edema Skin: no new rashes on exposed skin     Data Reviewed:   CBC: Recent Labs  Lab 05/14/23 0922 05/17/23 0320 05/18/23 0317 05/19/23 0306 05/20/23 0305  WBC 5.2 7.0 7.2 10.8* 8.3  NEUTROABS 3.9  --   --   --  6.8  HGB 8.1* 7.4* 7.3* 7.3* 8.8*  HCT 27.1* 23.8* 23.1* 23.1* 27.1*  MCV 97.5 92.2 92.8 91.7 90.0  PLT 223 142* 159 187 161   Basic Metabolic Panel: Recent Labs  Lab 05/16/23 0258 05/17/23 0320 05/17/23 8119 05/18/23 1478 05/19/23 0306 05/20/23 0305  NA 134* 134*  --  132* 132* 132*  K 4.1 3.3*  --  4.1 4.2 4.2  CL 96* 94*  --  94* 91* 92*  CO2 27 33*  --  31 30 33*  GLUCOSE 152* 123*  --  266* 185* 109*  BUN 35* 28*  --  32* 36* 39*  CREATININE 1.92* 1.62*  --  2.17* 1.80* 1.74*  CALCIUM 9.1 8.3*  --  8.2* 8.8* 8.6*   MG 1.4*  --  2.0  --   --   --    GFR: Estimated Creatinine Clearance: 23.9 mL/min (A) (by C-G formula based on SCr of 1.74 mg/dL (H)). Liver Function Tests: Recent Labs  Lab 05/14/23 0922  AST 58*  ALT 28  ALKPHOS 48  BILITOT 0.9  PROT 7.8  ALBUMIN 3.2*   Recent Labs  Lab 05/14/23 0922  LIPASE 19   No results for input(s): "AMMONIA" in the last 168 hours. Coagulation Profile: No results for input(s): "INR", "PROTIME" in the last 168 hours. Cardiac Enzymes: No results for input(s): "CKTOTAL", "CKMB", "CKMBINDEX", "TROPONINI" in the last 168 hours. BNP (last 3 results) Recent Labs    03/14/23 0924  PROBNP 235.0*   HbA1C: No results for input(s): "HGBA1C" in the last 72 hours. CBG: Recent Labs  Lab 05/19/23 1142 05/19/23 1551 05/19/23 2105 05/20/23 0600 05/20/23 1121  GLUCAP 180* 135* 146* 114* 94   Lipid Profile: No results for input(s): "CHOL", "HDL", "LDLCALC", "TRIG", "CHOLHDL", "LDLDIRECT" in the last 72 hours. Thyroid Function Tests: No results for input(s): "TSH", "T4TOTAL", "FREET4", "T3FREE", "THYROIDAB" in the last 72 hours.  Anemia Panel: No results for input(s): "VITAMINB12", "FOLATE", "FERRITIN", "TIBC", "IRON", "RETICCTPCT" in the last 72 hours.  Urine analysis:    Component Value Date/Time   COLORURINE YELLOW 05/14/2023 1148   APPEARANCEUR TURBID (A) 05/14/2023 1148   LABSPEC 1.014 05/14/2023 1148   PHURINE 5.0 05/14/2023 1148   GLUCOSEU NEGATIVE 05/14/2023 1148   GLUCOSEU NEGATIVE 03/15/2022 1217   HGBUR NEGATIVE 05/14/2023 1148   BILIRUBINUR NEGATIVE 05/14/2023 1148   KETONESUR NEGATIVE 05/14/2023 1148   PROTEINUR 30 (A) 05/14/2023 1148   UROBILINOGEN 0.2 03/15/2022 1217   NITRITE NEGATIVE 05/14/2023 1148   LEUKOCYTESUR NEGATIVE 05/14/2023 1148   Sepsis Labs: @LABRCNTIP (procalcitonin:4,lacticidven:4)  ) Recent Results (from the past 240 hours)  Resp panel by RT-PCR (RSV, Flu A&B, Covid) Anterior Nasal Swab     Status: None    Collection Time: 05/14/23  9:31 AM   Specimen: Anterior Nasal Swab  Result Value Ref Range Status   SARS Coronavirus 2 by RT PCR NEGATIVE NEGATIVE Final   Influenza A by PCR NEGATIVE NEGATIVE Final   Influenza B by PCR NEGATIVE NEGATIVE Final    Comment: (NOTE) The Xpert Xpress SARS-CoV-2/FLU/RSV plus assay is intended as an aid in the diagnosis of influenza from Nasopharyngeal swab specimens and should not be used as a sole basis for treatment. Nasal washings and aspirates are unacceptable for Xpert Xpress SARS-CoV-2/FLU/RSV testing.  Fact Sheet for Patients: BloggerCourse.com  Fact Sheet for Healthcare Providers: SeriousBroker.it  This test is not yet approved or cleared by the [place]  FDA and has been authorized for detection and/or diagnosis of SARS-CoV-2 by FDA under an Emergency Use Authorization (EUA). This EUA will remain in effect (meaning this test can be used) for the duration of the COVID-19 declaration under Section 564(b)(1) of the Act, 21 U.S.C. section 360bbb-3(b)(1), unless the authorization is terminated or revoked.     Resp  Syncytial Virus by PCR NEGATIVE NEGATIVE Final    Comment: (NOTE) Fact Sheet for Patients: BloggerCourse.com  Fact Sheet for Healthcare Providers: SeriousBroker.it  This test is not yet approved or cleared by the [place]  FDA and has been authorized for detection and/or diagnosis of SARS-CoV-2 by FDA under an Emergency Use Authorization (EUA). This EUA will remain in effect (meaning this test can be used) for the duration of the COVID-19 declaration under Section 564(b)(1) of the Act, 21 U.S.C. section 360bbb-3(b)(1), unless the authorization is terminated or revoked.  Performed at [facility], [address] [place], [place] [address]      Radiology Studies: DG Chest Port 1 View Result Date: 05/20/2023 CLINICAL  DATA:  Shortness of breath, respiratory distress EXAM: PORTABLE CHEST 1 VIEW COMPARISON:  05/14/2023 FINDINGS: Patchy bilateral airspace opacities have worsened since prior study. Cardiomegaly. No effusions or pneumothorax. No acute bony abnormality. IMPRESSION: Cardiomegaly. Patchy bilateral airspace disease concerning for multifocal pneumonia. Electronically Signed   By: Janeece Mechanic M.D.   On: 05/20/2023 03:16      Scheduled Meds:  amiodarone  200 mg Oral Daily   atorvastatin  40 mg Oral Daily   enoxaparin (LOVENOX) injection  30 mg Subcutaneous Q24H   feeding supplement  237 mL Oral BID BM   fesoterodine  4 mg Oral Daily   furosemide  80 mg Intravenous BID   hydrALAZINE  50 mg Oral Q8H   insulin aspart  0-5 Units Subcutaneous QHS   insulin aspart  0-6 Units Subcutaneous TID WC   insulin glargine-yfgn  20 Units Subcutaneous Daily   ipratropium-albuterol  3 mL Nebulization QID   isosorbide mononitrate  60 mg Oral Daily   lactulose  20 g Oral BID   levothyroxine  25 mcg Oral Daily   metoprolol succinate  12.5 mg Oral Daily   mirtazapine  15 mg Oral QHS   pantoprazole  40 mg Oral BID   sodium chloride flush  3 mL Intravenous Q12H   Continuous Infusions:  ampicillin-sulbactam (UNASYN) IV 3 g (05/20/23 1031)     LOS: 6 days    Time spent:    Deforest Fast, MD [facility]   05/20/2023, 3:44 PM

## 2023-05-20 NOTE — Progress Notes (Signed)
 Progress Note  Patient Name: Mary Mccarthy Date of Encounter: 05/20/2023 Primary Cardiologist: Randene Bustard, MD   Subjective   Placed on BIPAP overnight. Notes worsening breathing. Discouraged as she wants to go home.  Vital Signs    Vitals:   05/20/23 0406 05/20/23 0407 05/20/23 0710 05/20/23 0816  BP:  (!) 130/47 (!) 160/53   Pulse: 62 65 68   Resp: (!) 28 (!) 24 20 (!) 33  Temp:  98.9 F (37.2 C) 99.1 F (37.3 C)   TempSrc:  Axillary Oral   SpO2: 92% 94% 96%   Weight:  85.4 kg    Height:        Intake/Output Summary (Last 24 hours) at 05/20/2023 0820 Last data filed at 05/20/2023 0631 Gross per 24 hour  Intake 735 ml  Output 775 ml  Net -40 ml   Filed Weights   05/18/23 0532 05/19/23 0402 05/20/23 0407  Weight: 83.2 kg 84.8 kg 85.4 kg    Physical Exam   GEN: GCS 8 Neck: On BIPAP Cardiac: RRR, systolic crescendo murmur  Respiratory: Rhonchi bilaterally GI: Soft, nontender, non-distended  MS: trace bilateral edema  Labs   Telemetry: SR with rare PACs   Chemistry Recent Labs  Lab 05/14/23 0922 05/15/23 0301 05/18/23 0317 05/19/23 0306 05/20/23 0305  NA 135   < > 132* 132* 132*  K 4.1   < > 4.1 4.2 4.2  CL 100   < > 94* 91* 92*  CO2 25   < > 31 30 33*  GLUCOSE 109*   < > 266* 185* 109*  BUN 24*   < > 32* 36* 39*  CREATININE 1.98*   < > 2.17* 1.80* 1.74*  CALCIUM 9.4   < > 8.2* 8.8* 8.6*  PROT 7.8  --   --   --   --   ALBUMIN 3.2*  --   --   --   --   AST 58*  --   --   --   --   ALT 28  --   --   --   --   ALKPHOS 48  --   --   --   --   BILITOT 0.9  --   --   --   --   GFRNONAA 24*   < > 22* 27* 29*  ANIONGAP 10   < > 7 11 7    < > = values in this interval not displayed.     Hematology Recent Labs  Lab 05/18/23 0317 05/19/23 0306 05/20/23 0305  WBC 7.2 10.8* 8.3  RBC 2.49* 2.52* 3.01*  HGB 7.3* 7.3* 8.8*  HCT 23.1* 23.1* 27.1*  MCV 92.8 91.7 90.0  MCH 29.3 29.0 29.2  MCHC 31.6 31.6 32.5  RDW 15.5 15.8* 16.0*   PLT 159 187 161    Cardiac Enzymes BNP Recent Labs  Lab 05/14/23 0922 05/20/23 0305  BNP 977.8* 834.1*      Cardiac Studies   Cardiac Studies & Procedures   ______________________________________________________________________________________________ CARDIAC CATHETERIZATION  CARDIAC CATHETERIZATION 09/29/2020  Narrative   Prox RCA lesion is 20% stenosed.   Mid RCA to Dist RCA lesion is 20% stenosed.   Ramus lesion is 30% stenosed.   Mid LAD lesion is 20% stenosed.   Dist LM to Prox LAD lesion is 20% stenosed.  Mild non-obstructive CAD Severe aortic stenosis (mean gradient 52.8 mmHg, peak to peak gradient 58 mmHg, AVA 0.90 cm2).  Recommendations: Will continue workup for TAVR  Findings Coronary Findings Diagnostic  Dominance: Right  Left Main Dist LM to Prox LAD lesion is 20% stenosed.  Left Anterior Descending Vessel is large. Mid LAD lesion is 20% stenosed.  Ramus Intermedius Ramus lesion is 30% stenosed.  Right Coronary Artery Vessel is large. Prox RCA lesion is 20% stenosed. Mid RCA to Dist RCA lesion is 20% stenosed.  Intervention  No interventions have been documented.   CARDIAC CATHETERIZATION  CARDIAC CATHETERIZATION 05/21/2015  Narrative Images from the original result were not included. 1. Ost Ramus lesion, 60% stenosed. 2. Ost RCA lesion, 30% stenosed. 3. Dist LAD lesion, tapers to a small vessel with diffuse roughly 40-50% stenosis. 4. The left ventricular systolic function is normal. 5. Elevated LVEDP  No culprit lesion noted to explain the patient's elevated troponin. The most significant lesion in the ostial ramus intermedius. This is not PCI target  She has elevated LVEDP, but relatively preserved EF.  Plan:  Return to TCU for monitoring and continued care.  TR band removal per protocol.  Defer further cardiac evaluation and management to Dr. Maximo Spar.    Arleen Lacer, M.D., M.S. Interventional Cardiologist  Pager  # [phone] Phone # [phone] [address] [place], [place] [address]  Findings Coronary Findings Diagnostic  Dominance: Right  Left Main . Vessel is large.  Left Anterior Descending . Vessel is large. Tapers to relatively small caliber vessel distally with diffuse apical disease Diffuse. Vessel is a relatively small at this level with diffuse disease  First Diagonal Branch The vessel is moderate in size and is angiographically normal.  Lateral First Diagonal Branch The vessel is small in size.  First Septal Branch The vessel is small in size.  Second Septal Branch The vessel is small in size.  Third Septal Branch The vessel is small in size.  Ramus Intermedius . Vessel is moderate in size. Discrete located at the major branch.  Lateral Ramus Intermedius The vessel is small in size.  Left Circumflex . Vessel is angiographically normal.  First Obtuse Marginal Branch The vessel is angiographically normal.  Lateral First Obtuse Marginal Branch The vessel is small in size.  Right Coronary Artery . Vessel is large. Discrete.  Acute Marginal Branch The vessel is moderate in size.  First Right Posterolateral Branch The vessel is small in size.  Third Right Posterolateral Branch The vessel is small in size.  Intervention  No interventions have been documented.     ECHOCARDIOGRAM  ECHOCARDIOGRAM COMPLETE 05/15/2023  Narrative ECHOCARDIOGRAM REPORT    Patient Name:   Mary Mccarthy Date of Exam: 05/15/2023 Medical Rec #:  [id number]                Height:       61.0 in Accession #:    [id number]               Weight:       194.9 lb Date of Birth:  09/04/1940                BSA:          1.868 m Patient Age:    82 years                 BP:           155/126 mmHg Patient Gender: F                        HR:  61 bpm. Exam Location:  Inpatient  Procedure: 2D Echo, Cardiac Doppler and Color Doppler (Both Spectral and  Color Flow Doppler were utilized during procedure).  Indications:    I50.40* Unspecified combined systolic (congestive) and diastolic (congestive) heart failure  History:        Patient has prior history of Echocardiogram examinations, most recent 11/13/2022. CHF, CAD, Stroke, Aortic Valve Disease; Risk Factors:Hypertension, Dyslipidemia and Diabetes. TAVR procedure. Aortic Valve: 26 mm Medtronic stented (TAVR) valve is present in the aortic position. Procedure Date: 10/26/2020.  Sonographer:    Raynelle Callow RDCS Referring Phys: [id number] RONDELL A SMITH   Sonographer Comments: Study delayed, patient in chair IMPRESSIONS   1. Left ventricular ejection fraction, by estimation, is 60 to 65%. The left ventricle has normal function. The left ventricle has no regional wall motion abnormalities. There is severe asymmetric left ventricular hypertrophy of the septal segment. Left ventricular diastolic function could not be evaluated. 2. Right ventricular systolic function is normal. The right ventricular size is mildly enlarged. There is moderately elevated pulmonary artery systolic pressure. The estimated right ventricular systolic pressure is 53.7 mmHg. 3. Left atrial size was mildly dilated. 4. Right atrial size was mildly dilated. 5. The mitral valve is degenerative. Trivial mitral valve regurgitation. Mild mitral stenosis. The mean mitral valve gradient is 3.0 mmHg. Severe mitral annular calcification. 6. Tricuspid valve regurgitation is mild to moderate. 7. Mild paravalvular leak adjacent to the intervalvular fibrosa. The aortic valve has been repaired/replaced. Aortic valve regurgitation is mild. No aortic stenosis is present. There is a 26 mm Medtronic stented (TAVR) valve present in the aortic position. Procedure Date: 10/26/2020. Echo findings are consistent with normal structure and function of the aortic valve prosthesis. Echo findings are consistent with perivalvular leak of the aortic  prosthesis. Aortic regurgitation PHT measures 514 msec. Aortic valve mean gradient measures 9.0 mmHg. Aortic valve Vmax measures 1.88 m/s. 8. The inferior vena cava is dilated in size with <50% respiratory variability, suggesting right atrial pressure of 15 mmHg.  Comparison(s): Prior images reviewed side by side. PVL is more prominent, stable RVSP.  FINDINGS Left Ventricle: Left ventricular ejection fraction, by estimation, is 60 to 65%. The left ventricle has normal function. The left ventricle has no regional wall motion abnormalities. Strain was performed and the global longitudinal strain is indeterminate. The left ventricular internal cavity size was normal in size. There is severe asymmetric left ventricular hypertrophy of the septal segment. Left ventricular diastolic function could not be evaluated due to mitral annular calcification (moderate or greater). Left ventricular diastolic function could not be evaluated.  Right Ventricle: The right ventricular size is mildly enlarged. No increase in right ventricular wall thickness. Right ventricular systolic function is normal. There is moderately elevated pulmonary artery systolic pressure. The tricuspid regurgitant velocity is 3.11 m/s, and with an assumed right atrial pressure of 15 mmHg, the estimated right ventricular systolic pressure is 53.7 mmHg.  Left Atrium: Left atrial size was mildly dilated.  Right Atrium: Right atrial size was mildly dilated.  Pericardium: There is no evidence of pericardial effusion.  Mitral Valve: The mitral valve is degenerative in appearance. Severe mitral annular calcification. Trivial mitral valve regurgitation. Mild mitral valve stenosis. MV peak gradient, 11.8 mmHg. The mean mitral valve gradient is 3.0 mmHg with average heart rate of 59 bpm.  Tricuspid Valve: The tricuspid valve is normal in structure. Tricuspid valve regurgitation is mild to moderate. No evidence of tricuspid stenosis.  Aortic  Valve: Mild paravalvular leak adjacent  to the intervalvular fibrosa. The aortic valve has been repaired/replaced. Aortic valve regurgitation is mild. Aortic regurgitation PHT measures 514 msec. No aortic stenosis is present. Aortic valve mean gradient measures 9.0 mmHg. Aortic valve peak gradient measures 14.1 mmHg. Aortic valve area, by VTI measures 1.92 cm. There is a 26 mm Medtronic stented (TAVR) valve present in the aortic position. Procedure Date: 10/26/2020. Echo findings are consistent with normal structure and function of the aortic valve prosthesis.  Pulmonic Valve: The pulmonic valve was normal in structure. Pulmonic valve regurgitation is not visualized. No evidence of pulmonic stenosis.  Aorta: The aortic root and ascending aorta are structurally normal, with no evidence of dilitation.  Venous: The inferior vena cava is dilated in size with less than 50% respiratory variability, suggesting right atrial pressure of 15 mmHg.  IAS/Shunts: The atrial septum is grossly normal.  Additional Comments: 3D was performed not requiring image post processing on an independent workstation and was indeterminate.   LEFT VENTRICLE PLAX 2D LVIDd:         4.60 cm     Diastology LVIDs:         2.90 cm     LV e' medial:    5.55 cm/s LV PW:         1.10 cm     LV E/e' medial:  25.9 LV IVS:        1.50 cm     LV e' lateral:   6.64 cm/s LVOT diam:     2.00 cm     LV E/e' lateral: 21.7 LV SV:         81 LV SV Index:   43 LVOT Area:     3.14 cm  LV Volumes (MOD) LV vol d, MOD A2C: 84.9 ml LV vol d, MOD A4C: 68.3 ml LV vol s, MOD A2C: 27.4 ml LV vol s, MOD A4C: 31.5 ml LV SV MOD A2C:     57.5 ml LV SV MOD A4C:     68.3 ml LV SV MOD BP:      46.8 ml  RIGHT VENTRICLE             IVC RV S prime:     10.90 cm/s  IVC diam: 2.70 cm TAPSE (M-mode): 2.2 cm  LEFT ATRIUM             Index        RIGHT ATRIUM           Index LA diam:        5.20 cm 2.78 cm/m   RA Area:     21.80 cm LA Vol (A2C):    54.6 ml 29.23 ml/m  RA Volume:   64.90 ml  34.74 ml/m LA Vol (A4C):   62.4 ml 33.40 ml/m LA Biplane Vol: 59.4 ml 31.80 ml/m AORTIC VALVE AV Area (Vmax):    1.93 cm AV Area (Vmean):   1.83 cm AV Area (VTI):     1.92 cm AV Vmax:           187.67 cm/s AV Vmean:          122.000 cm/s AV VTI:            0.420 m AV Peak Grad:      14.1 mmHg AV Mean Grad:      9.0 mmHg LVOT Vmax:         115.00 cm/s LVOT Vmean:        71.000 cm/s LVOT VTI:  0.257 m LVOT/AV VTI ratio: 0.61 AI PHT:            514 msec  AORTA Ao Root diam: 3.30 cm Ao Asc diam:  3.25 cm  MITRAL VALVE                TRICUSPID VALVE MV Area (PHT): 4.60 cm     TR Peak grad:   38.7 mmHg MV Area VTI:   1.66 cm     TR Vmax:        311.00 cm/s MV Peak grad:  11.8 mmHg MV Mean grad:  3.0 mmHg     SHUNTS MV Vmax:       1.72 m/s     Systemic VTI:  0.26 m MV Vmean:      74.7 cm/s    Systemic Diam: 2.00 cm MV Decel Time: 165 msec MV E velocity: 144.00 cm/s MV A velocity: 71.80 cm/s MV E/A ratio:  2.01  Gloriann Larger MD Electronically signed by Gloriann Larger MD Signature Date/Time: 05/15/2023/5:44:08 PM    Final    MONITORS  LONG TERM MONITOR-LIVE TELEMETRY (3-14 DAYS) 11/19/2020  Narrative  Predominantly sinus rhythm with minimum heart rate of 49 bpm, maximum 114 bpm. Average 68 bpm.  Total of 206 (Nonsustained Ventricular Tachycardia-V. tach fastest 5 beats continue 39 bpm. Longest 9.3 seconds average rate 1 3 5  bpm.  A. fib burden (16%) rate range 62-139 bpm with average rate 105 bpm. Longest was for 17 hr 55 min. Some aberrant conduction suspected.  Rare isolated PACs and PVCs. Some PVC couplets and triplets were noted.  No high-grade AV block noted.  Bursts of V. tach could be A. fib with aberrancy.   Patch Wear Time:  12 days and 19 hours (2022-09-22T09:52:09-398 to 2022-10-05T05:32:17-0400)  Abnormal monitor.  Still shows a significant mount of atrial fibrillation and short  burst of nonsustained V. tach.  Thankfully, no high-grade AV block.  I think we can continue amiodarone and probably titrate up beta-blocker.   Randene Bustard, MD   CT SCANS  CT CORONARY MORPH W/CTA COR W/SCORE 09/28/2020  Addendum 09/29/2020  1:17 PM ADDENDUM REPORT: 09/29/2020 13:14  CLINICAL DATA:  Pre-op transcatheter aortic valve replacement (TAVR)  EXAM: Cardiac TAVR CT  TECHNIQUE: The patient was scanned on a Siemens Force 192 slice scanner. A 120 kV retrospective scan was triggered in the descending thoracic aorta at 111 HU's. Gantry rotation speed was 270 msecs and collimation was .9 mm. The 3D data set was reconstructed in 5% intervals of the R-R cycle. Systolic and diastolic phases were analyzed on a dedicated work station using MPR, MIP and VRT modes. The patient received 95mL OMNIPAQUE IOHEXOL 350 MG/ML SOLN of contrast.  FINDINGS: Aortic Valve: Tricuspid aortic valve. Severely reduced cusp separation. Severely thickened, moderately calcified aortic valve cusps.  AV calcium score: 1049  Virtual Basal Annulus Measurements:  Maximum/Minimum Diameter: 23.1 mm x 18.9 mm  Perimeter: 65.4 mm  Area: 328 mm2  No significant LVOT calcifications.  Based on these measurements, the annulus would be suitable for a 20 mm Edwards Sapien 3 valve vs 23 mm Medtronic Evolut Pro valve. Sinus dimensions are borderline for 26 mm Medtronic Evolut Pro valve. Recommend Heart Team discussion for valve sizing.  Sinus of Valsalva Measurements:  Non-coronary:  26 mm  Right - coronary:  26 mm  Left - coronary:  27 mm  Sinus of Valsalva Height:  Left: 17.5 mm  Right: 18.6 mm  Aorta: Mild aortic atherosclerosis.  Sinotubular Junction: 27 mm  Ascending Thoracic Aorta:  31 mm  Aortic Arch:  24 mm  Descending Thoracic Aorta:  22 mm  Coronary Artery Height above Annulus:  Left Main: 13 mm  Right Coronary: 11.6 mm  Coronary Arteries: Normal origins. 3 vessel  coronary artery calcifications.  Optimum Fluoroscopic Angle for Delivery: RAO 1, CRA 1  Moderate mitral annular calcification.  No left atrial appendage thrombus.  IMPRESSION: 1. Tricuspid aortic valve. Severely reduced cusp separation. Severely thickened, moderately calcified aortic valve cusps.  2.  AV calcium score: 1049  3. Annulus area: 328 mm2, no significant LVOT calcifications. Based on these measurements, the annulus would be suitable for a 20 mm Edwards Sapien 3 valve vs 23 mm Medtronic Evolut Pro valve. Sinus dimensions are borderline for 26 mm Medtronic Evolut Pro valve. Recommend Heart Team discussion for valve sizing.  4.  Sufficient coronary artery height from annulus.  5.  Optimum Fluoroscopic Angle for Delivery: RAO 1, CRA 1   Electronically Signed By: Grady Lawman M.D. On: 09/29/2020 13:14  Narrative EXAM: OVER-READ INTERPRETATION  CT CHEST  The following report is an over-read performed by radiologist Dr. Alexandria Angel of [facility] on 09/29/2020. This over-read does not include interpretation of cardiac or coronary anatomy or pathology. The coronary calcium score/coronary CTA interpretation by the cardiologist is attached.  COMPARISON:  Cardiac CTA 10/09/2019.  FINDINGS: Extracardiac findings will be described separately under dictation for contemporaneously obtained CTA chest, abdomen and pelvis.  IMPRESSION: Please see separate dictation for contemporaneously obtained CTA chest, abdomen and pelvis dated 09/28/2020 for full description of relevant extracardiac findings.  Electronically Signed: By: Alexandria Angel M.D. On: 09/29/2020 06:07   CT SCANS  CT CORONARY FRACTIONAL FLOW RESERVE DATA PREP 10/09/2019  Narrative EXAM: CT FFR ANALYSIS  CLINICAL DATA:  Chest pain/anginal equiv, 70yr CHD risk 10-20%, not treadmill candidate  FINDINGS: FFRct analysis was performed on the original cardiac CT angiogram dataset.  Diagrammatic representation of the FFRct analysis is provided in a separate PDF document in PACS. This dictation was created using the PDF document and an interactive 3D model of the results. 3D model is not available in the EMR/PACS. Normal FFR range is >0.80.  1. Left Main:  No significant stenosis. FFR = 0.99  2. LAD: No significant stenosis. Proximal FFR = 0.97, Mid FFR = 0.90, Distal FFR = 0.85 3. LCX: No significant stenosis. Proximal FFR = 0.98, Distal FFR = 0.91 4. RCA: No significant stenosis. Proximal FFR = 0.96, Mid FFR = 0.92, Distal FFR = 0.91  IMPRESSION: 1.  CT FFR analysis did not show any significant stenosis.   Electronically Signed By: Hazle Lites M.D. On: 10/10/2019 23:18   CT CORONARY MORPH W/CTA COR W/SCORE 10/09/2019  Addendum 10/09/2019  9:12 PM ADDENDUM REPORT: 10/09/2019 21:10  HISTORY: 83 yo female with chest pain/anginal equiv, 47yr CHD risk 10-20%, not treadmill candidate  EXAM: Cardiac/Coronary CTA  TECHNIQUE: The patient was scanned on a [employer].  PROTOCOL: A 120 kV prospective scan was triggered in the descending thoracic aorta at 111 HU's. Axial non-contrast 3 mm slices were carried out through the heart. The data set was analyzed on a dedicated work station and scored using the Agatson method. Gantry rotation speed was 250 msecs and collimation was .6 mm. Beta blockade and 0.8 mg of sl NTG was given. The 3D data set was reconstructed in 5% intervals of the 67-82 % of the R-R cycle. Diastolic phases were  analyzed on a dedicated work station using MPR, MIP and VRT modes. The patient received 80mL OMNIPAQUE IOHEXOL 350 MG/ML SOLN of contrast.  FINDINGS: Quality: Fair, HR 59  Coronary calcium score: The patient's coronary artery calcium score is 624, which places the patient in the 82nd percentile.  Coronary arteries: Normal coronary origins.  Right dominance.  Right Coronary Artery: Dominant. Gives off large R-PDA  and R-PLB branches. Mild 25-49% proximal mixed stenosis (CADRADS 2). No significant distal or branch disease.  Left Main Coronary Artery: Normal left main. Bifurcates into the LAD and LCx branches.  Left Anterior Descending Coronary Artery: Moderate sized anterior artery which gives off a mid-vessel diagonal branch. There is diffuse calcified plaque with mild 25-49% proximal stenosis (CADRADS3) and minimal mixed mid to distal vessel stenosis (<25% - CADRADS1).  Ramus intermedius: Small branch <2.0 mm, mild to moderate ostial disease  Left Circumflex Artery: AV groove vessel. There is a moderate mixed 50-69% proximal stenosis (CADRADS3). Small distal OM branch without disease.  Aorta: Normal size, 30 mm at the mid ascending aorta (level of the PA bifurcation) measured double oblique. Aortic atherosclerosis. No dissection.  Aortic Valve: Trileaflet with leaflet and annular calcification. Probable aortic stenosis.  Other findings:  Normal pulmonary vein drainage into the left atrium.  Normal left atrial appendage without a thrombus.  Dilated main pulmonary artery to 28 mm, suggestive of pulmonary hypertension.  Mitral annular calcification  IMPRESSION: 1. Diffuse multivessel mixed CAD, possibly significant in the proximal LCx, CADRADS = 3. CT FFR will be performed and reported separately.  2. Coronary calcium score of 624. This was 82nd percentile for age and sex matched control.  3. Normal coronary origin with right dominance.  4. Dilated main pulmonary artery to 28 mm, suggestive of pulmonary hypertension.  5. Mitral annular calcification  6. Aortic annular and leaflet calcification, probable aortic stenosis   Electronically Signed By: Hazle Lites M.D. On: 10/09/2019 21:10  Narrative EXAM: OVER-READ INTERPRETATION  CT CHEST  The following report is an over-read performed by radiologist Dr. Alexandria Angel of [facility] on 10/09/2019.  This over-read does not include interpretation of cardiac or coronary anatomy or pathology. The coronary calcium score/coronary CTA interpretation by the cardiologist is attached.  COMPARISON:  None.  FINDINGS: Aortic atherosclerosis. Within the visualized portions of the thorax there are no suspicious appearing pulmonary nodules or masses, there is no acute consolidative airspace disease, no pleural effusions, no pneumothorax and no lymphadenopathy. Visualized portions of the upper abdomen are unremarkable. There are no aggressive appearing lytic or blastic lesions noted in the visualized portions of the skeleton.  IMPRESSION: 1.  Aortic Atherosclerosis (ICD10-I70.0).  Electronically Signed: By: Alexandria Angel M.D. On: 10/09/2019 15:23     ______________________________________________________________________________________________         Assessment & Plan   Heart failure with preserved ejection fraction (acute on chronic) - BNP sill elevated, given 40 IV lasix - wean BIPAP - no MRA (CKD 4), no SGLT2i due to UTIs - I will give lasix 80 IV BID today; I am concerned about hypervolemia as a contributor to her disease  Persistent atrial fibrillation - Continue amiodarone 200 mg - Continue low dose metoprolol succinate - was not cleared for AC (anemia requiring transfusion)  Hypertension - BP has improved on current therapy  Chronic kidney disease stage 4 - patient is not planned for HD - new hyponatremia with improving creatinine; may be related to hypervolemic - complicated by albumin of 3.2  Nonobstructive coronary  artery disease Mild nonobstructive coronary artery disease with secondary demand ischemia. She is currently on a statin for management.  Iron deficiency anemia - managed by [facility]  Goals of Care - no changes to GOC made; she is at high risk of re-admission  Follow-up The goal is for her to be discharged tomorrow with close follow-up with Doctor  Harding's team. (F/u scheduled 4/18 then 6/3)   For questions or updates, please contact [facility] Please consult [email or web address] for contact info under Cardiology/STEMI.      Gloriann Larger, MD FASE [facility] Cardiologist [facility]  [address], [address] [place], [place] [address] [phone]  8:20 AM

## 2023-05-21 DIAGNOSIS — I5033 Acute on chronic diastolic (congestive) heart failure: Secondary | ICD-10-CM | POA: Diagnosis not present

## 2023-05-21 LAB — CBC
HCT: 28.6 % — ABNORMAL LOW (ref 36.0–46.0)
Hemoglobin: 9.1 g/dL — ABNORMAL LOW (ref 12.0–15.0)
MCH: 29.1 pg (ref 26.0–34.0)
MCHC: 31.8 g/dL (ref 30.0–36.0)
MCV: 91.4 fL (ref 80.0–100.0)
Platelets: 147 10*3/uL — ABNORMAL LOW (ref 150–400)
RBC: 3.13 MIL/uL — ABNORMAL LOW (ref 3.87–5.11)
RDW: 16.1 % — ABNORMAL HIGH (ref 11.5–15.5)
WBC: 7.1 10*3/uL (ref 4.0–10.5)
nRBC: 0 % (ref 0.0–0.2)

## 2023-05-21 LAB — BASIC METABOLIC PANEL WITH GFR
Anion gap: 8 (ref 5–15)
BUN: 34 mg/dL — ABNORMAL HIGH (ref 8–23)
CO2: 34 mmol/L — ABNORMAL HIGH (ref 22–32)
Calcium: 8.9 mg/dL (ref 8.9–10.3)
Chloride: 95 mmol/L — ABNORMAL LOW (ref 98–111)
Creatinine, Ser: 1.49 mg/dL — ABNORMAL HIGH (ref 0.44–1.00)
GFR, Estimated: 34 mL/min — ABNORMAL LOW (ref >60)
Glucose, Bld: 128 mg/dL — ABNORMAL HIGH (ref 70–99)
Potassium: 3.6 mmol/L (ref 3.5–5.1)
Sodium: 137 mmol/L (ref 135–145)

## 2023-05-21 LAB — GLUCOSE, CAPILLARY
Glucose-Capillary: 134 mg/dL — ABNORMAL HIGH (ref 70–99)
Glucose-Capillary: 202 mg/dL — ABNORMAL HIGH (ref 70–99)
Glucose-Capillary: 147 mg/dL — ABNORMAL HIGH (ref 70–99)
Glucose-Capillary: 153 mg/dL — ABNORMAL HIGH (ref 70–99)

## 2023-05-21 MED ORDER — FUROSEMIDE 10 MG/ML IJ SOLN: 80.0000 mg | Freq: Two times a day (BID) | INTRAMUSCULAR | Status: DC

## 2023-05-21 MED ORDER — FUROSEMIDE 10 MG/ML IJ SOLN
80.0000 mg | Freq: Two times a day (BID) | INTRAMUSCULAR | Status: DC
  Administered 2023-05-21 – 2023-05-23 (×5): 80 mg via INTRAVENOUS
  Filled 2023-05-21 (×5): qty 8

## 2023-05-21 MED ORDER — POTASSIUM CHLORIDE CRYS ER 20 MEQ PO TBCR
40.0000 meq | EXTENDED_RELEASE_TABLET | Freq: Once | ORAL | Status: AC
  Administered 2023-05-21: 40 meq via ORAL
  Filled 2023-05-21: qty 2

## 2023-05-21 MED ORDER — HYDRALAZINE HCL 50 MG PO TABS
75.0000 mg | ORAL_TABLET | Freq: Three times a day (TID) | ORAL | Status: DC
  Administered 2023-05-21 – 2023-05-31 (×28): 75 mg via ORAL
  Filled 2023-05-21 (×33): qty 1

## 2023-05-21 NOTE — Progress Notes (Signed)
 Heart Failure Stewardship Pharmacist Progress Note   PCP: Corwin Levins, MD PCP-Cardiologist: Bryan Lemma, MD    HPI:  83 yo F with PMH of HTN, HLD, T2DM, CKD3, CAD, paroxysmal afib, recurrent GI bleed, severe aortic stenosis s/p TAVR complicated with transient heart block, respiratory failure on 2L [place] at BL, hypothyroidism, GERD   Patient previously admitted 02/2023 for CHF exacerbation. Patient was diuresed during admission and home lasix was increased from prn to daily.   Presented to the ED on 4/7 with worsening SOB with exertion and leg swelling over the last 2 weeks. Also presented with productive cough, chest pain/tightness, NVD, and wheezing. Reports PCP instructed her to take an additional dose of 20mg  lasix in the setting of weight gain of 3 lb - no improvement per pt. Endorses right flank pain 2/2 coughing.   In the ED, patient was hypertensive and hypoxic (did not present on her home O2) wheezing, rhonchi, rales and BL LE edema present on exam. Slight murmur present. JVD Patient diuresed with lasix 40mg  x2 in the ED. CXR showed mild to moderate pulmonary vascular congestion. No dense consolidation or lung collapse. ECHO 4/8 showed LVEF 60-65% (unchanged from prior 11/2022) with normal LV function. Severe asymmetric LV hypertrophy of septal segment. LV diastolic function could not be evaluated. Normal RV function. Mildly enlarged LV. MV is degenerative with trivial regurgitation. Mild stenosis and severe annular calcifications also present. Mild to moderate tricuspid valve regurgitation. Cardiology was consulted, planning with diuresis for now.   Patient reports SOB, congestion when laying down but still improving since yesterday. Patient oxygen status worsened to 10L HFNC. Of note, patient diagnosed with aspiration PNA over weekend, and has been started on IV abx. Endorses that cough is better, but still productive. Minimal BL leg swelling on exam.   Current HF  Medications: Diuretic: s/p IV Lasix 80mg  BID  Beta Blocker: Metoprolol succinate 12.5mg  daily  Other: isosorbide mononitrate 60mg  daily, hydralazine 50mg  q8h  Prior to admission HF Medications: Diuretic: Lasix 60mg  daily Beta blocker: Metoprolol succinate 12.5mg  daily  Other: Isosorbide mononitrate 15mg  daily, hydralazine 25mg  q8h  Pertinent Lab Values: Serum creatinine 2.17>1.74>1.49 (BL 1.3-1.5?), BUN 39>34, Potassium 4.2>3.6, Sodium 137, BNP 977.8, Magnesium 2.2>1.4>2, A1c 6.5   Vital Signs: Weight: (bed) 191> (bed) 180 lbs (admission weight: 191 lbs) Blood pressure: 150-170s/40-70s  Heart rate: 60-80s  I/O: net -2.4L yesterday; net -7.2L since admission 6 L HFNC > 3L [place] > 10L HFNC  Medication Assistance / Insurance Benefits Check: Does the patient have prescription insurance?  Yes Type of insurance plan: Medicare  Outpatient Pharmacy:  Prior to admission outpatient pharmacy: [facility]  Is the patient willing to use [facility] TOC pharmacy at discharge? Yes Is the patient willing to transition their outpatient pharmacy to utilize a [facility] outpatient pharmacy? No    Assessment: 1. Acute on chronic diastolic CHF (LVEF 60-65%), due to unknown etiology . NYHA class 2-3 symptoms.Strict I/Os and daily weights. Keep K>4 and Mg>2. Scr improving, reaching baseline.Overall oxygen status worsening due to aspiration PNA or volume overload. Leg swelling improved on exam.  - Agree with deferring SGLT2i given hx of 3 UTI in prior 1-2 months  - Defer titrating Metoprolol succinate to 25mg  daily for BP control until euvolemic  - Consider IV/PO lasix if evidence of leg swelling on exam - Consider ACE/ARB/ARNI or MRA until renal function stabilizes, confirmed fluid status and diuretic plan  - Consider increasing hydralaze to 75mg  q8h for BP control  Plan: 1) Medication changes recommended at this time: - Increase hydralazine to 75mg  q8h  - Monitor O2 status  - order Mg  - Monitor renal  function  - Replace K with 40meq x1   2) Patient assistance: - Contacted son and he is interested in utilizing [facility] for medication changes upon discharge  but would [facility] and remaining prescriptions  - Son reported that current medications are affordable  3)  Education  - Patient has been educated on current HF medications and potential additions to HF medication regimen - Patient verbalizes understanding that over the next few months, these medication doses may change and more medications may be added to optimize HF regimen - Patient has been educated on basic disease state pathophysiology and goals of therapy   Harvest Lineman, PharmD PGY1 Pharmacy Resident

## 2023-05-21 NOTE — Evaluation (Signed)
 Clinical/Bedside Swallow Evaluation Patient Details  Name: Mary Mccarthy MRN: [id number] Date of Birth: Aug 25, 1940  Today's Date: 05/21/2023 Time: SLP Start Time (ACUTE ONLY): 1125 SLP Stop Time (ACUTE ONLY): 1137 SLP Time Calculation (min) (ACUTE ONLY): 12 min  Past Medical History:  Past Medical History:  Diagnosis Date   ALLERGIC RHINITIS 06/24/2007   Anemia    AVM (arteriovenous malformation) of colon    Blood transfusion without reported diagnosis 05/2015   COLONIC POLYPS, HX OF 06/24/2007   Coronary artery disease, non-occlusive 05/2015   Trop + w/ Acute Anemia =>CATH: small RI - Ostial 60%, ostial RCA 30% and dLAD 40-50%;; 9/'21: Cor Ca Score 624. Mild (25-49%) prox RCA & LAD,; Moderate (50-69%) Ostial Small RI & prox LCx.     DEPRESSION 10/08/2006   DIABETES MELLITUS, TYPE II 10/05/2006   GERD 06/24/2007   History of CVA (cerebrovascular accident) 08/10/2020   08/2020- found to be in afib with RVR, started on Eliquis (shower of emboli from A. fib as complication of severe AS)   HYPERLIPIDEMIA 10/08/2006   HYPERTENSION 10/08/2006   INSOMNIA 09/24/2008   Left knee DJD    Nonsustained paroxysmal ventricular tachycardia (HCC) 10/2018   Post-TAVR Zio Patch: Frequent (206) runs of NSVT - Fastest 5 beats 239 bpm. Longest 9.3 Sec avg 135 bpm. (? possibly Afib w/ aberrancy)   Osteoporosis 03/10/2016   PAF (paroxysmal atrial fibrillation) (HCC)    PAF with RVR: CHA2DS2-VASc 9.  On Eliquis and amiodarone. 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 Clifton James & Dr. Laneta Simmers   Severe aortic stenosis 08/2020   Progression from Mod-Severe AS to Severe AS - noted on Echo 08/2020 -> (mean gradient progressed from 34 to 42 mmHg) this was in setting of new onset A. fib RVR, acute diastolic HF and shower of emboli CVA. ->  Referred for TAVR, completed  10/26/2020   Past Surgical History:  Past Surgical History:  Procedure Laterality Date   APPENDECTOMY     BIOPSY  10/21/2020   Procedure: BIOPSY;  Surgeon: Imogene Burn, MD;  Location: [facility] ENDOSCOPY;  Service: Gastroenterology;;   BREAST BIOPSY     CARDIAC CATHETERIZATION N/A 05/21/2015   Procedure: Left Heart Cath and Coronary Angiography;  Surgeon: [name], MD;  Location: [facility] INVASIVE CV LAB;  Service: Cardiovascular;: Ost RI 60%, Ost RCA 30%, dLAD tapers to small vessel w/ 40-50%. Mildly elevated LVEDP. Normal LV Fxn.   COLONOSCOPY N/A 05/20/2015   Procedure: COLONOSCOPY;  Surgeon: Ruffin Frederick, MD;  Location: [facility] ENDOSCOPY;  Service: Gastroenterology;  Laterality: N/A;   CORONARY CA2+ SCORE / CARDIAC CT ANGIOGRAM  10/09/2019   Calcium score 624.  82nd percentile. Dominant RCA: Mild (25-49%) proximal stenosis-distal bifurcation into PDA and PAV--< RPL branches.  LAD (1 major mid vessel diagonal) diffuse calcified plaque, mild proximal stenosis with minimal distal stenosis.  Small RI moderate ostial disease.  LCx-moderate mixed (50-69%) proximal stenosis.  Small dOM1 disease.  Trileaflet AoV, annular Ca2+ - probable AS   ESOPHAGOGASTRODUODENOSCOPY N/A 05/20/2015   Procedure: ESOPHAGOGASTRODUODENOSCOPY (EGD);  Surgeon: Ruffin Frederick, MD;  Location: [facility] ENDOSCOPY;  Service: Gastroenterology;  Laterality: N/A;   ESOPHAGOGASTRODUODENOSCOPY (EGD) WITH PROPOFOL N/A 10/21/2020   Procedure: ESOPHAGOGASTRODUODENOSCOPY (EGD) WITH PROPOFOL;  Surgeon: Imogene Burn, MD;  Location: [facility] ENDOSCOPY;  Service: Gastroenterology;  Laterality: N/A;   INTRAOPERATIVE  TRANSTHORACIC ECHOCARDIOGRAM N/A 10/26/2020   Procedure: INTRAOPERATIVE TRANSTHORACIC ECHOCARDIOGRAM;  Surgeon: Clifton James; Location: [facility] OR; Pre-TAVR well-visualized calcified AoV mean Grad 37 mmHg, AVA 0.72 cm => Post TAVR well-positioned supra-annular 26 mm Medtronic Evolut Pro valve placed with no PVL.  Mean gradient 14 mmHg.   AVR 1.58 cm.  Normal flow to RCA and LM-LAD.  ; EF 60 to 65%.  Degenerative severe MAC w/ mild MR.   RIGHT/LEFT HEART CATH AND CORONARY ANGIOGRAPHY N/A 09/29/2020   Procedure: RIGHT/LEFT HEART CATH AND CORONARY ANGIOGRAPHY;  Surgeon: Kathleene Hazel, MD;  Location: [facility] INVASIVE CV LAB;  Service: Cardiovascular; pre-TAVR:  pRCA 20%, m-dRCA 30%. D LM-pLAD 20%. RI 30%, mLAD 20%.  Severe AS with mean gradient measured at 52.8 mmHg.   TONSILLECTOMY     TRANSCATHETER AORTIC VALVE REPLACEMENT, TRANSFEMORAL N/A 10/26/2020   Procedure: TRANSCATHETER AORTIC VALVE REPLACEMENT, TRANSFEMORAL;  Surgeon: Kathleene Hazel, MD;  Location: [facility] OR;  Service: Open Heart Surgery;; Medtronic Evolut-Pro + (size 26 mm, model # EVPROPLUS -26US, serial # [id number]); transient high-grade AV block noted so PPM left in place.   TRANSTHORACIC ECHOCARDIOGRAM  03/17/2019   a) 03/2019: EF 60 to 65%.  Moderate LVH.  GRII DD.  Mod-Severe AS (m grad 36 mmHg, peak 59 mmHg); b) 09/2019: EF 65-70%, No RWMA. Gr1 DD/hi LAP, Mild hi PAP. Mod LA Dil. MOD AS (mean Grad 34.5 mmHg).  STABLE   TRANSTHORACIC ECHOCARDIOGRAM  08/27/2020   Admitted for CVA/Afib RVR & CHF (Progression to SEVERE SYMPTOMATIC AS):  Severe Calcific Aortic Stenosis (VTI AVA estimated 0.86 cm, mean gradient 42 mmHg, V-max 4.36 m/s).  EF 55 to 60%.  Severe concentric LVH.  Unable to determine diastolic parameters.  Moderately elevated PAP.  Mild LA dilation.  Mild circumferential pericardial effusion.  Trivial MR.  Severe MAC.   TRANSTHORACIC ECHOCARDIOGRAM  11/25/2020   1 Month s/p TAVR: EF 55 to 60%.  No R WMA.  GR 1 DD.  Elevated LVEDP.  Normal RV size with mildly elevated RVP estimated 44 mmHg.  Oscillating density in the RV suspect calcified chordal apparatus versus calcified thrombus.  Moderate LA dilation.  Normal IVC/RA P => Well-positioned 26 mm Medtronic Evolut Pro THVwith mean AOV gradient 11 mmHg.  Trivial PVL   TRANSTHORACIC ECHOCARDIOGRAM  11/02/2020    a) Day 1 Post-op TAVR 10/27/20:  Well-Positioned Supra Annular Medtronic Evolut Pro THP.  Mean gradient 12 mmHg, peak 24 mmHg.  AVA 1.8 cm.  Trivial PVL.  EF 60 to 65%.  Normal LV function.  Mild LVH.  Severe LA dilation.  Mild RA dilation.  Severe MAC;; b) 11/02/20: Normal structure and function of the aortic valve prosthesis.  Mean gradient 9 mmHg (otherwise stable)   TUBAL LIGATION     HPI:  Mary Mccarthy is an 83 yo female presenting to ED 4/7 with acute on chronic respiratory failure with hypoxia secondary to acute on chronic diastolic CHF. CXR 4/13 showed patchy bilateral airspace disease concerning for multifocal PNA. Placed on BiPAP overnight 4/13. PMH includes HTN, HLD, respiratory failure on 2L, PAF, GI bleed, aortic stenosis s/p TAVR, transient heart block, T2DM, CAD, hypothyroidism, GERD    Assessment / Plan / Recommendation  Clinical Impression  Pt's voice lacks intensity and is breathy, which she states is different than baseline. She is able to achieve a strong cued cough. Pt unable to complete the 3 oz water test without stopping due to a reported distaste for the water/ice. She drank sequential straw sips  of ginger ale without s/s clinically concerning for aspiration. Mastication of solids resulted in mildly increased WOB, although pt states she prefers her meals continue coming as they are. Continue current diet of regular solids with thin liquids. SLP will f/u at least briefly for further assessment of swallowing given respiratory status and intermittent use of BiPAP. SLP Visit Diagnosis: Dysphagia, unspecified (R13.10)    Aspiration Risk  Mild aspiration risk    Diet Recommendation Regular;Thin liquid    Liquid Administration via: Cup;Straw Medication Administration: Whole meds with liquid Supervision: Patient able to self feed Compensations: Minimize environmental distractions;Slow rate;Small sips/bites Postural Changes: Seated upright at 90 degrees;Remain upright for at  least 30 minutes after po intake    Other  Recommendations Oral Care Recommendations: Oral care BID    Recommendations for follow up therapy are one component of a multi-disciplinary discharge planning process, led by the attending physician.  Recommendations may be updated based on patient status, additional functional criteria and insurance authorization.  Follow up Recommendations No SLP follow up      Assistance Recommended at Discharge    Functional Status Assessment Patient has had a recent decline in their functional status and demonstrates the ability to make significant improvements in function in a reasonable and predictable amount of time.  Frequency and Duration min 2x/week  1 week       Prognosis Prognosis for improved oropharyngeal function: Good      Swallow Study   General HPI: Mary Mccarthy is an 83 yo female presenting to ED 4/7 with acute on chronic respiratory failure with hypoxia secondary to acute on chronic diastolic CHF. CXR 4/13 showed patchy bilateral airspace disease concerning for multifocal PNA. Placed on BiPAP overnight 4/13. PMH includes HTN, HLD, respiratory failure on 2L, PAF, GI bleed, aortic stenosis s/p TAVR, transient heart block, T2DM, CAD, hypothyroidism, GERD Type of Study: Bedside Swallow Evaluation Previous Swallow Assessment: none in chart Diet Prior to this Study: Regular;Thin liquids (Level 0) Temperature Spikes Noted: No Respiratory Status: Nasal cannula (10L HFNC) History of Recent Intubation: No Behavior/Cognition: Alert;Cooperative Oral Cavity Assessment: Within Functional Limits Oral Care Completed by SLP: No Oral Cavity - Dentition: Dentures, top;Dentures, bottom Vision: Functional for self-feeding Self-Feeding Abilities: Able to feed self Patient Positioning: Upright in chair Baseline Vocal Quality: Breathy;Low vocal intensity Volitional Cough: Strong;Congested Volitional Swallow: Able to elicit    Oral/Motor/Sensory  Function Overall Oral Motor/Sensory Function: Within functional limits   Ice Chips Ice chips: Not tested   Thin Liquid Thin Liquid: Within functional limits Presentation: Straw;Self Fed    Nectar Thick Nectar Thick Liquid: Not tested   Honey Thick Honey Thick Liquid: Not tested   Puree Puree: Not tested   Solid     Solid: Within functional limits Presentation: Self Fed      Amil Kale, M.A., CF-SLP Speech Language Pathology, Acute Rehabilitation Services  Secure Chat preferred [phone]  05/21/2023,11:58 AM

## 2023-05-21 NOTE — Progress Notes (Signed)
 Physical Therapy Treatment Patient Details Name: Mary Mccarthy MRN: [id number] DOB: February 14, 1940 Today's Date: 05/21/2023   History of Present Illness Patient is a 83 year old female with acute on chronic respiratory failure with hypoxia secondary to acute on chronic diastolic CHF. History of HTN, hyperlipidemia, respiratory failure on 2 L, PAF, GI bleed, aortic stenosis s/p TAVR,  transient heart block, diabetes mellitus type II, CAD, hypothyroidism, GERD    PT Comments  Pt resting in bed on arrival and agreeable to session with steady progress towards acute goals, however pt continues to be limited by decreased activity tolerance and decreased cardiopulmonary endurance. Pt on 9L O2 via HFNC on arrival with SpO2 94%, pt able to maintain SpO2 >90% on 10L during activity with 9L replaced at end of session. Physically pt requiring grossly CGA for gait with RW for support with seated rest between bouts needed due to fatigue. Educated pt in flutter valve and IS use with pt able to demonstrate and verbalize understanding of both and frequency of use. Pt continues to benefit from skilled PT services to progress toward functional mobility goals.      If plan is discharge home, recommend the following: Assist for transportation;Help with stairs or ramp for entrance;Assistance with cooking/housework;A little help with walking and/or transfers;A little help with bathing/dressing/bathroom   Can travel by private vehicle        Equipment Recommendations  None recommended by PT    Recommendations for Other Services       Precautions / Restrictions Precautions Precautions: Fall Recall of Precautions/Restrictions: Intact Precaution/Restrictions Comments: watch SpO2 Restrictions Weight Bearing Restrictions Per Provider Order: No     Mobility  Bed Mobility Overal bed mobility: Needs Assistance Bed Mobility: Supine to Sit     Supine to sit: HOB elevated, Mod assist     General bed  mobility comments: asssit to elevate trunk and scoot out to EOB with use of bed pad    Transfers Overall transfer level: Needs assistance Equipment used: Rolling walker (2 wheels) Transfers: Sit to/from Stand Sit to Stand: Contact guard assist, Mod assist           General transfer comment: mod A to boost to stand on initial attempt from EOB, CGA from chair with UE support on arm rests    Ambulation/Gait Ambulation/Gait assistance: Contact guard assist Gait Distance (Feet): 24 Feet (+12') Assistive device: Rolling walker (2 wheels) Gait Pattern/deviations: Step-through pattern, Trunk flexed, Decreased stride length Gait velocity: decreased     General Gait Details: Pt ambulated with short slow steps and flexed posture, distance limited by fatigue with pt needing x1 seated rest   Stairs             Wheelchair Mobility     Tilt Bed    Modified Rankin (Stroke Patients Only)       Balance Overall balance assessment: Needs assistance Sitting-balance support: Feet supported Sitting balance-[name]: Good     Standing balance support: Bilateral upper extremity supported, During functional activity, Reliant on assistive device for balance Standing balance-[name]: Poor Standing balance comment: reliant on UE support of RW                            Communication Communication Communication: Impaired Factors Affecting Communication: Reduced clarity of speech (Pt speaks in low hushed tones)  Cognition Arousal: Alert Behavior During Therapy: WFL for tasks assessed/performed   PT - Cognitive impairments: No apparent impairments  Following commands: Intact      Cueing Cueing Techniques: Verbal cues  Exercises Other Exercises Other Exercises: reviewed flutter valve and IS with pt able to demosntrate use of both    General Comments General comments (skin integrity, edema, etc.): SpO2 94% on 9L on arrival,  maintaining SpO2 91-95% on 10L with activity as tank without option for 9L. replaced 9L at end of session, pt with productive cough at rest      Pertinent Vitals/Pain Pain Assessment Pain Assessment: No/denies pain Pain Intervention(s): Monitored during session    Home Living                          Prior Function            PT Goals (current goals can now be found in the care plan section) Acute Rehab PT Goals Patient Stated Goal: Breath easier and move better PT Goal Formulation: With patient Time For Goal Achievement: 05/29/23 Progress towards PT goals: Progressing toward goals    Frequency    Min 2X/week      PT Plan      Co-evaluation              AM-PAC PT "6 Clicks" Mobility   Outcome Measure  Help needed turning from your back to your side while in a flat bed without using bedrails?: A Little Help needed moving from lying on your back to sitting on the side of a flat bed without using bedrails?: A Little Help needed moving to and from a bed to a chair (including a wheelchair)?: A Little Help needed standing up from a chair using your arms (e.g., wheelchair or bedside chair)?: A Little Help needed to walk in hospital room?: A Lot Help needed climbing 3-5 steps with a railing? : A Lot 6 Click Score: 16    End of Session Equipment Utilized During Treatment: Gait belt;Oxygen Activity Tolerance: Patient tolerated treatment well;Patient limited by fatigue Patient left: with call bell/phone within reach;in chair Nurse Communication: Mobility status PT Visit Diagnosis: Muscle weakness (generalized) (M62.81);Unsteadiness on feet (R26.81)     Time: 1610-9604 PT Time Calculation (min) (ACUTE ONLY): 29 min  Charges:    $Gait Training: 8-22 mins $Therapeutic Activity: 8-22 mins PT General Charges $$ ACUTE PT VISIT: 1 Visit                     Allyn Bertoni R. PTA Acute Rehabilitation Services Office: [phone]   Agapito Horseman 05/21/2023, 10:20  AM

## 2023-05-21 NOTE — Progress Notes (Signed)
 PROGRESS NOTE    Mary Mccarthy  RSW:[id number]  DOB: 07/11/1940  DOA: 05/14/2023 PCP: Corwin Levins, MD Outpatient Specialists:   Hospital course:  82/F w hypertension, hyperlipidemia, paroxysmal atrial fibrillation, aortic stenosis sp TAVR, T2DM and obesity who presented with dyspnea and lower extremity edema. In the ED, Hypoxic and Vol overloaded, cr 1,98  BNP 977, troponin 35 and 31 , CXR wpositive cardiomegaly, bilateral hilar vascular congestion with cephalization of the vasculature.  -Slow improvement with diuresis and treatment for reactive airway disease -4/13: Overnight with worsening hypoxia and tachypnea, distress requiring BiPAP -4/14 chest x-ray with worsening airspace opacities compared to admission, started on Unasyn    Subjective:  Patient states she feels weak and tired.  Admits that she has a cough which is not new, notes it has been present since admission.  Does feel her breathing is improved compared to last night   Objective: Vitals:   05/21/23 1130 05/21/23 1153 05/21/23 1204 05/21/23 1539  BP:   (!) 141/64 126/68  Pulse: 77 84 84 77  Resp: (!) 28 (!) 23 (!) 23 (!) 30  Temp:   97.8 F (36.6 C) 98.6 F (37 C)  TempSrc:   Oral Oral  SpO2: 95% 100% 90% 94%  Weight:      Height:        Intake/Output Summary (Last 24 hours) at 05/21/2023 1628 Last data filed at 05/21/2023 1535 Gross per 24 hour  Intake 400 ml  Output 2050 ml  Net -1650 ml   Filed Weights   05/19/23 0402 05/20/23 0407 05/21/23 0353  Weight: 84.8 kg 85.4 kg 82 kg     Exam:  General: Weak appearing female sitting up in recliner with unlabored tachypnea, shallow breaths, junky sounding wet cough Eyes: sclera anicteric, conjuctiva mild injection bilaterally CVS: S1-S2, regular  Respiratory:  decreased air entry bilaterally with coarse breath sounds bilaterally GI: NABS, soft, NT  LE: Warm and well-perfused Neuro: A/O x 3,  grossly nonfocal.   Data  Reviewed:  Basic Metabolic Panel: Recent Labs  Lab 05/16/23 0258 05/17/23 0320 05/17/23 0836 05/18/23 0317 05/19/23 0306 05/20/23 0305 05/21/23 0307  NA 134* 134*  --  132* 132* 132* 137  K 4.1 3.3*  --  4.1 4.2 4.2 3.6  CL 96* 94*  --  94* 91* 92* 95*  CO2 27 33*  --  31 30 33* 34*  GLUCOSE 152* 123*  --  266* 185* 109* 128*  BUN 35* 28*  --  32* 36* 39* 34*  CREATININE 1.92* 1.62*  --  2.17* 1.80* 1.74* 1.49*  CALCIUM 9.1 8.3*  --  8.2* 8.8* 8.6* 8.9  MG 1.4*  --  2.0  --   --   --   --     CBC: Recent Labs  Lab 05/17/23 0320 05/18/23 0317 05/19/23 0306 05/20/23 0305 05/21/23 0307  WBC 7.0 7.2 10.8* 8.3 7.1  NEUTROABS  --   --   --  6.8  --   HGB 7.4* 7.3* 7.3* 8.8* 9.1*  HCT 23.8* 23.1* 23.1* 27.1* 28.6*  MCV 92.2 92.8 91.7 90.0 91.4  PLT 142* 159 187 161 147*     Scheduled Meds:  amiodarone  200 mg Oral Daily   atorvastatin  40 mg Oral Daily   enoxaparin (LOVENOX) injection  30 mg Subcutaneous Q24H   feeding supplement  237 mL Oral BID BM   fesoterodine  4 mg Oral Daily   furosemide  80 mg Intravenous  BID   hydrALAZINE  75 mg Oral Q8H   insulin aspart  0-5 Units Subcutaneous QHS   insulin aspart  0-6 Units Subcutaneous TID WC   insulin glargine-yfgn  20 Units Subcutaneous Daily   ipratropium-albuterol  3 mL Nebulization QID   isosorbide mononitrate  60 mg Oral Daily   lactulose  20 g Oral BID   levothyroxine  25 mcg Oral Daily   metoprolol succinate  12.5 mg Oral Daily   mirtazapine  15 mg Oral QHS   pantoprazole  40 mg Oral BID   potassium chloride  40 mEq Oral Once   sodium chloride flush  3 mL Intravenous Q12H   Continuous Infusions:  ampicillin-sulbactam (UNASYN) IV 3 g (05/21/23 0905)     Assessment & Plan:   Acute hypoxic respiratory failure, worsened overnight requiring BiPAP R/o aspiration pneumonia  Decompensated HFpEF Pulmonary hypertension 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 smoke Bronchitis Patient started on Unasyn last night for  possible aspiration, day #2 today Patient is off of BiPAP, notes her breathing is improved Continue inhaled bronchodilators, has completed course of steroids x 5 days Lasix 80 IV twice daily was initiated per cardiology GDMT limited by CKD, no SGLT2 secondary to recurrent UTIs Appreciate ongoing cardiology recommendations  Atrial fibrillation Controlled on amiodarone metoprolol Declines anticoagulation  HTN  CAD S/P TAVR September 2022 hydralazine increased per cardiology Continue metoprolol  Steroid-induced hyperglycemia Continue glargine  Anxiety and depression Continue mirtazapine and alprazolam per home doses  Hypothyroidism Continue Synthroid   DVT prophylaxis: Lovenox Code Status: DNR Family Communication: None today     Studies: DG Chest Port 1 View Result Date: 05/20/2023 CLINICAL DATA:  Shortness of breath, respiratory distress EXAM: PORTABLE CHEST 1 VIEW COMPARISON:  05/14/2023 FINDINGS: Patchy bilateral airspace opacities have worsened since prior study. Cardiomegaly. No effusions or pneumothorax. No acute bony abnormality. IMPRESSION: Cardiomegaly. Patchy bilateral airspace disease concerning for multifocal pneumonia. Electronically Signed   By: Janeece Mechanic M.D.   On: 05/20/2023 03:16    Principal Problem:   Acute on chronic diastolic CHF (congestive heart failure) (HCC) Active Problems:   Paroxysmal atrial fibrillation (HCC)   Coronary artery disease, non-occlusive   Hypertensive urgency   CKD (chronic kidney disease), stage IV (HCC)   Type 2 diabetes mellitus with hyperlipidemia (HCC)   Anxiety with depression   Hypothyroidism   Normocytic anemia     Mitchell Iwanicki Tublu Trason [place], [facility]  If 7PM-7AM, please contact night-coverage [email or web address]   LOS: 7 days

## 2023-05-21 NOTE — Progress Notes (Signed)
 Progress Note  Patient Name: [name] Date of Encounter: 05/21/2023 Primary Cardiologist: Bryan Lemma, MD   Subjective   Received IV ABX yesterday for potential aspiration PNA. Given IV lasix  Feels no better than 05/20/23 but is off BIPAP.  Vital Signs    Vitals:   05/21/23 0353 05/21/23 0618 05/21/23 0730 05/21/23 0835  BP: (!) 152/51 (!) 173/49 (!) 152/43   Pulse: 64  72   Resp: 20  (!) 26   Temp: 98.3 F (36.8 C)  99.5 F (37.5 C)   TempSrc: Axillary  Axillary   SpO2: 96%  100% 94%  Weight: 82 kg     Height:        Intake/Output Summary (Last 24 hours) at 05/21/2023 0911 Last data filed at 05/21/2023 0408 Gross per 24 hour  Intake 523 ml  Output 2250 ml  Net -1727 ml   Filed Weights   05/19/23 0402 05/20/23 0407 05/21/23 0353  Weight: 84.8 kg 85.4 kg 82 kg    Physical Exam   GEN: Chronically ill appearing Cardiac: RRR, systolic crescendo murmur  Respiratory: Rhonchi bilaterally GI: Soft, nontender, non-distended  MS: Trace bilateral edema  Labs   Telemetry: SR with PACs and PVCs   Chemistry Recent Labs  Lab 05/14/23 0922 05/15/23 0301 05/19/23 0306 05/20/23 0305 05/21/23 0307  NA 135   < > 132* 132* 137  K 4.1   < > 4.2 4.2 3.6  CL 100   < > 91* 92* 95*  CO2 25   < > 30 33* 34*  GLUCOSE 109*   < > 185* 109* 128*  BUN 24*   < > 36* 39* 34*  CREATININE 1.98*   < > 1.80* 1.74* 1.49*  CALCIUM 9.4   < > 8.8* 8.6* 8.9  PROT 7.8  --   --   --   --   ALBUMIN 3.2*  --   --   --   --   AST 58*  --   --   --   --   ALT 28  --   --   --   --   ALKPHOS 48  --   --   --   --   BILITOT 0.9  --   --   --   --   GFRNONAA 24*   < > 27* 29* 34*  ANIONGAP 10   < > 11 7 8    < > = values in this interval not displayed.     Hematology Recent Labs  Lab 05/19/23 0306 05/20/23 0305 05/21/23 0307  WBC 10.8* 8.3 7.1  RBC 2.52* 3.01* 3.13*  HGB 7.3* 8.8* 9.1*  HCT 23.1* 27.1* 28.6*  MCV 91.7 90.0 91.4  MCH 29.0 29.2 29.1  MCHC 31.6  32.5 31.8  RDW 15.8* 16.0* 16.1*  PLT 187 161 147*    Cardiac Enzymes BNP Recent Labs  Lab 05/14/23 0922 05/20/23 0305  BNP 977.8* 834.1*      Cardiac Studies   Cardiac Studies & Procedures   ______________________________________________________________________________________________ CARDIAC CATHETERIZATION  CARDIAC CATHETERIZATION 09/29/2020  Narrative   Prox RCA lesion is 20% stenosed.   Mid RCA to Dist RCA lesion is 20% stenosed.   Ramus lesion is 30% stenosed.   Mid LAD lesion is 20% stenosed.   Dist LM to Prox LAD lesion is 20% stenosed.  Mild non-obstructive CAD Severe aortic stenosis (mean gradient 52.8 mmHg, peak to peak gradient 58 mmHg, AVA 0.90 cm2).  Recommendations:  Will continue workup for TAVR  Findings Coronary Findings Diagnostic  Dominance: Right  Left Main Dist LM to Prox LAD lesion is 20% stenosed.  Left Anterior Descending Vessel is large. Mid LAD lesion is 20% stenosed.  Ramus Intermedius Ramus lesion is 30% stenosed.  Right Coronary Artery Vessel is large. Prox RCA lesion is 20% stenosed. Mid RCA to Dist RCA lesion is 20% stenosed.  Intervention  No interventions have been documented.   CARDIAC CATHETERIZATION  CARDIAC CATHETERIZATION 05/21/2015  Narrative Images from the original result were not included. 1. Ost Ramus lesion, 60% stenosed. 2. Ost RCA lesion, 30% stenosed. 3. Dist LAD lesion, tapers to a small vessel with diffuse roughly 40-50% stenosis. 4. The left ventricular systolic function is normal. 5. Elevated LVEDP  No culprit lesion noted to explain the patient's elevated troponin. The most significant lesion in the ostial ramus intermedius. This is not PCI target  She has elevated LVEDP, but relatively preserved EF.  Plan:  Return to TCU for monitoring and continued care.  TR band removal per protocol.  Defer further cardiac evaluation and management to Dr. Rennis Golden.    [name], M.D.,  M.S. Interventional Cardiologist  Pager # [phone] Phone # [phone] [address] [place], [place] [address]  Findings Coronary Findings Diagnostic  Dominance: Right  Left Main . Vessel is large.  Left Anterior Descending . Vessel is large. Tapers to relatively small caliber vessel distally with diffuse apical disease Diffuse. Vessel is a relatively small at this level with diffuse disease  First Diagonal Branch The vessel is moderate in size and is angiographically normal.  Lateral First Diagonal Branch The vessel is small in size.  First Septal Branch The vessel is small in size.  Second Septal Branch The vessel is small in size.  Third Septal Branch The vessel is small in size.  Ramus Intermedius . Vessel is moderate in size. Discrete located at the major branch.  Lateral Ramus Intermedius The vessel is small in size.  Left Circumflex . Vessel is angiographically normal.  First Obtuse Marginal Branch The vessel is angiographically normal.  Lateral First Obtuse Marginal Branch The vessel is small in size.  Right Coronary Artery . Vessel is large. Discrete.  Acute Marginal Branch The vessel is moderate in size.  First Right Posterolateral Branch The vessel is small in size.  Third Right Posterolateral Branch The vessel is small in size.  Intervention  No interventions have been documented.     ECHOCARDIOGRAM  ECHOCARDIOGRAM COMPLETE 05/15/2023  Narrative ECHOCARDIOGRAM REPORT    Patient Name:   NEITA LANDRIGAN Date of Exam: 05/15/2023 Medical Rec #:  [id number]                Height:       61.0 in Accession #:    [id number]               Weight:       194.9 lb Date of Birth:  09-15-1940                BSA:          1.868 m Patient Age:    83 years                 BP:           155/126 mmHg Patient Gender: F  HR:           61 bpm. Exam Location:  Inpatient  Procedure: 2D Echo, Cardiac  Doppler and Color Doppler (Both Spectral and Color Flow Doppler were utilized during procedure).  Indications:    I50.40* Unspecified combined systolic (congestive) and diastolic (congestive) heart failure  History:        Patient has prior history of Echocardiogram examinations, most recent 11/13/2022. CHF, CAD, Stroke, Aortic Valve Disease; Risk Factors:Hypertension, Dyslipidemia and Diabetes. TAVR procedure. Aortic Valve: 26 mm Medtronic stented (TAVR) valve is present in the aortic position. Procedure Date: 10/26/2020.  Sonographer:    Sheralyn Boatman RDCS Referring Phys: [id number] RONDELL A SMITH   Sonographer Comments: Study delayed, patient in chair IMPRESSIONS   1. Left ventricular ejection fraction, by estimation, is 60 to 65%. The left ventricle has normal function. The left ventricle has no regional wall motion abnormalities. There is severe asymmetric left ventricular hypertrophy of the septal segment. Left ventricular diastolic function could not be evaluated. 2. Right ventricular systolic function is normal. The right ventricular size is mildly enlarged. There is moderately elevated pulmonary artery systolic pressure. The estimated right ventricular systolic pressure is 53.7 mmHg. 3. Left atrial size was mildly dilated. 4. Right atrial size was mildly dilated. 5. The mitral valve is degenerative. Trivial mitral valve regurgitation. Mild mitral stenosis. The mean mitral valve gradient is 3.0 mmHg. Severe mitral annular calcification. 6. Tricuspid valve regurgitation is mild to moderate. 7. Mild paravalvular leak adjacent to the intervalvular fibrosa. The aortic valve has been repaired/replaced. Aortic valve regurgitation is mild. No aortic stenosis is present. There is a 26 mm Medtronic stented (TAVR) valve present in the aortic position. Procedure Date: 10/26/2020. Echo findings are consistent with normal structure and function of the aortic valve prosthesis. Echo findings are  consistent with perivalvular leak of the aortic prosthesis. Aortic regurgitation PHT measures 514 msec. Aortic valve mean gradient measures 9.0 mmHg. Aortic valve Vmax measures 1.88 m/s. 8. The inferior vena cava is dilated in size with <50% respiratory variability, suggesting right atrial pressure of 15 mmHg.  Comparison(s): Prior images reviewed side by side. PVL is more prominent, stable RVSP.  FINDINGS Left Ventricle: Left ventricular ejection fraction, by estimation, is 60 to 65%. The left ventricle has normal function. The left ventricle has no regional wall motion abnormalities. Strain was performed and the global longitudinal strain is indeterminate. The left ventricular internal cavity size was normal in size. There is severe asymmetric left ventricular hypertrophy of the septal segment. Left ventricular diastolic function could not be evaluated due to mitral annular calcification (moderate or greater). Left ventricular diastolic function could not be evaluated.  Right Ventricle: The right ventricular size is mildly enlarged. No increase in right ventricular wall thickness. Right ventricular systolic function is normal. There is moderately elevated pulmonary artery systolic pressure. The tricuspid regurgitant velocity is 3.11 m/s, and with an assumed right atrial pressure of 15 mmHg, the estimated right ventricular systolic pressure is 53.7 mmHg.  Left Atrium: Left atrial size was mildly dilated.  Right Atrium: Right atrial size was mildly dilated.  Pericardium: There is no evidence of pericardial effusion.  Mitral Valve: The mitral valve is degenerative in appearance. Severe mitral annular calcification. Trivial mitral valve regurgitation. Mild mitral valve stenosis. MV peak gradient, 11.8 mmHg. The mean mitral valve gradient is 3.0 mmHg with average heart rate of 59 bpm.  Tricuspid Valve: The tricuspid valve is normal in structure. Tricuspid valve regurgitation is mild to moderate.  No  evidence of tricuspid stenosis.  Aortic Valve: Mild paravalvular leak adjacent to the intervalvular fibrosa. The aortic valve has been repaired/replaced. Aortic valve regurgitation is mild. Aortic regurgitation PHT measures 514 msec. No aortic stenosis is present. Aortic valve mean gradient measures 9.0 mmHg. Aortic valve peak gradient measures 14.1 mmHg. Aortic valve area, by VTI measures 1.92 cm. There is a 26 mm Medtronic stented (TAVR) valve present in the aortic position. Procedure Date: 10/26/2020. Echo findings are consistent with normal structure and function of the aortic valve prosthesis.  Pulmonic Valve: The pulmonic valve was normal in structure. Pulmonic valve regurgitation is not visualized. No evidence of pulmonic stenosis.  Aorta: The aortic root and ascending aorta are structurally normal, with no evidence of dilitation.  Venous: The inferior vena cava is dilated in size with less than 50% respiratory variability, suggesting right atrial pressure of 15 mmHg.  IAS/Shunts: The atrial septum is grossly normal.  Additional Comments: 3D was performed not requiring image post processing on an independent workstation and was indeterminate.   LEFT VENTRICLE PLAX 2D LVIDd:         4.60 cm     Diastology LVIDs:         2.90 cm     LV e' medial:    5.55 cm/s LV PW:         1.10 cm     LV E/e' medial:  25.9 LV IVS:        1.50 cm     LV e' lateral:   6.64 cm/s LVOT diam:     2.00 cm     LV E/e' lateral: 21.7 LV SV:         81 LV SV Index:   43 LVOT Area:     3.14 cm  LV Volumes (MOD) LV vol d, MOD A2C: 84.9 ml LV vol d, MOD A4C: 68.3 ml LV vol s, MOD A2C: 27.4 ml LV vol s, MOD A4C: 31.5 ml LV SV MOD A2C:     57.5 ml LV SV MOD A4C:     68.3 ml LV SV MOD BP:      46.8 ml  RIGHT VENTRICLE             IVC RV S prime:     10.90 cm/s  IVC diam: 2.70 cm TAPSE (M-mode): 2.2 cm  LEFT ATRIUM             Index        RIGHT ATRIUM           Index LA diam:        5.20 cm 2.78  cm/m   RA Area:     21.80 cm LA Vol (A2C):   54.6 ml 29.23 ml/m  RA Volume:   64.90 ml  34.74 ml/m LA Vol (A4C):   62.4 ml 33.40 ml/m LA Biplane Vol: 59.4 ml 31.80 ml/m AORTIC VALVE AV Area (Vmax):    1.93 cm AV Area (Vmean):   1.83 cm AV Area (VTI):     1.92 cm AV Vmax:           187.67 cm/s AV Vmean:          122.000 cm/s AV VTI:            0.420 m AV Peak Grad:      14.1 mmHg AV Mean Grad:      9.0 mmHg LVOT Vmax:         115.00 cm/s LVOT Vmean:  71.000 cm/s LVOT VTI:          0.257 m LVOT/AV VTI ratio: 0.61 AI PHT:            514 msec  AORTA Ao Root diam: 3.30 cm Ao Asc diam:  3.25 cm  MITRAL VALVE                TRICUSPID VALVE MV Area (PHT): 4.60 cm     TR Peak grad:   38.7 mmHg MV Area VTI:   1.66 cm     TR Vmax:        311.00 cm/s MV Peak grad:  11.8 mmHg MV Mean grad:  3.0 mmHg     SHUNTS MV Vmax:       1.72 m/s     Systemic VTI:  0.26 m MV Vmean:      74.7 cm/s    Systemic Diam: 2.00 cm MV Decel Time: 165 msec MV E velocity: 144.00 cm/s MV A velocity: 71.80 cm/s MV E/A ratio:  2.01  Riley Lam MD Electronically signed by Riley Lam MD Signature Date/Time: 05/15/2023/5:44:08 PM    Final    MONITORS  LONG TERM MONITOR-LIVE TELEMETRY (3-14 DAYS) 11/19/2020  Narrative  Predominantly sinus rhythm with minimum heart rate of 49 bpm, maximum 114 bpm. Average 68 bpm.  Total of 206 (Nonsustained Ventricular Tachycardia-V. tach fastest 5 beats continue 39 bpm. Longest 9.3 seconds average rate 1 3 5  bpm.  A. fib burden (16%) rate range 62-139 bpm with average rate 105 bpm. Longest was for 17 hr 55 min. Some aberrant conduction suspected.  Rare isolated PACs and PVCs. Some PVC couplets and triplets were noted.  No high-grade AV block noted.  Bursts of V. tach could be A. fib with aberrancy.   Patch Wear Time:  12 days and 19 hours (2022-09-22T09:52:09-398 to 2022-10-05T05:32:17-0400)  Abnormal monitor.  Still shows a  significant mount of atrial fibrillation and short burst of nonsustained V. tach.  Thankfully, no high-grade AV block.  I think we can continue amiodarone and probably titrate up beta-blocker.   Bryan Lemma, MD   CT SCANS  CT CORONARY MORPH W/CTA COR W/SCORE 09/28/2020  Addendum 09/29/2020  1:17 PM ADDENDUM REPORT: 09/29/2020 13:14  CLINICAL DATA:  Pre-op transcatheter aortic valve replacement (TAVR)  EXAM: Cardiac TAVR CT  TECHNIQUE: The patient was scanned on a Siemens Force 192 slice scanner. A 120 kV retrospective scan was triggered in the descending thoracic aorta at 111 HU's. Gantry rotation speed was 270 msecs and collimation was .9 mm. The 3D data set was reconstructed in 5% intervals of the R-R cycle. Systolic and diastolic phases were analyzed on a dedicated work station using MPR, MIP and VRT modes. The patient received 95mL OMNIPAQUE IOHEXOL 350 MG/ML SOLN of contrast.  FINDINGS: Aortic Valve: Tricuspid aortic valve. Severely reduced cusp separation. Severely thickened, moderately calcified aortic valve cusps.  AV calcium score: 1049  Virtual Basal Annulus Measurements:  Maximum/Minimum Diameter: 23.1 mm x 18.9 mm  Perimeter: 65.4 mm  Area: 328 mm2  No significant LVOT calcifications.  Based on these measurements, the annulus would be suitable for a 20 mm Edwards Sapien 3 valve vs 23 mm Medtronic Evolut Pro valve. Sinus dimensions are borderline for 26 mm Medtronic Evolut Pro valve. Recommend Heart Team discussion for valve sizing.  Sinus of Valsalva Measurements:  Non-coronary:  26 mm  Right - coronary:  26 mm  Left - coronary:  27 mm  Sinus of Valsalva Height:  Left: 17.5 mm  Right: 18.6 mm  Aorta: Mild aortic atherosclerosis.  Sinotubular Junction: 27 mm  Ascending Thoracic Aorta:  31 mm  Aortic Arch:  24 mm  Descending Thoracic Aorta:  22 mm  Coronary Artery Height above Annulus:  Left Main: 13 mm  Right Coronary: 11.6  mm  Coronary Arteries: Normal origins. 3 vessel coronary artery calcifications.  Optimum Fluoroscopic Angle for Delivery: RAO 1, CRA 1  Moderate mitral annular calcification.  No left atrial appendage thrombus.  IMPRESSION: 1. Tricuspid aortic valve. Severely reduced cusp separation. Severely thickened, moderately calcified aortic valve cusps.  2.  AV calcium score: 1049  3. Annulus area: 328 mm2, no significant LVOT calcifications. Based on these measurements, the annulus would be suitable for a 20 mm Edwards Sapien 3 valve vs 23 mm Medtronic Evolut Pro valve. Sinus dimensions are borderline for 26 mm Medtronic Evolut Pro valve. Recommend Heart Team discussion for valve sizing.  4.  Sufficient coronary artery height from annulus.  5.  Optimum Fluoroscopic Angle for Delivery: RAO 1, CRA 1   Electronically Signed By: Grady Lawman M.D. On: 09/29/2020 13:14  Narrative EXAM: OVER-READ INTERPRETATION  CT CHEST  The following report is an over-read performed by radiologist Dr. Alexandria Angel of [facility] on 09/29/2020. This over-read does not include interpretation of cardiac or coronary anatomy or pathology. The coronary calcium score/coronary CTA interpretation by the cardiologist is attached.  COMPARISON:  Cardiac CTA 10/09/2019.  FINDINGS: Extracardiac findings will be described separately under dictation for contemporaneously obtained CTA chest, abdomen and pelvis.  IMPRESSION: Please see separate dictation for contemporaneously obtained CTA chest, abdomen and pelvis dated 09/28/2020 for full description of relevant extracardiac findings.  Electronically Signed: By: Alexandria Angel M.D. On: 09/29/2020 06:07   CT SCANS  CT CORONARY FRACTIONAL FLOW RESERVE DATA PREP 10/09/2019  Narrative EXAM: CT FFR ANALYSIS  CLINICAL DATA:  Chest pain/anginal equiv, 64yr CHD risk 10-20%, not treadmill candidate  FINDINGS: FFRct analysis was  performed on the original cardiac CT angiogram dataset. Diagrammatic representation of the FFRct analysis is provided in a separate PDF document in PACS. This dictation was created using the PDF document and an interactive 3D model of the results. 3D model is not available in the EMR/PACS. Normal FFR range is >0.80.  1. Left Main:  No significant stenosis. FFR = 0.99  2. LAD: No significant stenosis. Proximal FFR = 0.97, Mid FFR = 0.90, Distal FFR = 0.85 3. LCX: No significant stenosis. Proximal FFR = 0.98, Distal FFR = 0.91 4. RCA: No significant stenosis. Proximal FFR = 0.96, Mid FFR = 0.92, Distal FFR = 0.91  IMPRESSION: 1.  CT FFR analysis did not show any significant stenosis.   Electronically Signed By: Hazle Lites M.D. On: 10/10/2019 23:18   CT CORONARY MORPH W/CTA COR W/SCORE 10/09/2019  Addendum 10/09/2019  9:12 PM ADDENDUM REPORT: 10/09/2019 21:10  HISTORY: 83 yo female with chest pain/anginal equiv, 6yr CHD risk 10-20%, not treadmill candidate  EXAM: Cardiac/Coronary CTA  TECHNIQUE: The patient was scanned on a [employer].  PROTOCOL: A 120 kV prospective scan was triggered in the descending thoracic aorta at 111 HU's. Axial non-contrast 3 mm slices were carried out through the heart. The data set was analyzed on a dedicated work station and scored using the Agatson method. Gantry rotation speed was 250 msecs and collimation was .6 mm. Beta blockade and 0.8 mg of sl NTG was given. The 3D data set was reconstructed in  5% intervals of the 67-82 % of the R-R cycle. Diastolic phases were analyzed on a dedicated work station using MPR, MIP and VRT modes. The patient received 80mL OMNIPAQUE IOHEXOL 350 MG/ML SOLN of contrast.  FINDINGS: Quality: Fair, HR 59  Coronary calcium score: The patient's coronary artery calcium score is 624, which places the patient in the 82nd percentile.  Coronary arteries: Normal coronary origins.  Right  dominance.  Right Coronary Artery: Dominant. Gives off large R-PDA and R-PLB branches. Mild 25-49% proximal mixed stenosis (CADRADS 2). No significant distal or branch disease.  Left Main Coronary Artery: Normal left main. Bifurcates into the LAD and LCx branches.  Left Anterior Descending Coronary Artery: Moderate sized anterior artery which gives off a mid-vessel diagonal branch. There is diffuse calcified plaque with mild 25-49% proximal stenosis (CADRADS3) and minimal mixed mid to distal vessel stenosis (<25% - CADRADS1).  Ramus intermedius: Small branch <2.0 mm, mild to moderate ostial disease  Left Circumflex Artery: AV groove vessel. There is a moderate mixed 50-69% proximal stenosis (CADRADS3). Small distal OM branch without disease.  Aorta: Normal size, 30 mm at the mid ascending aorta (level of the PA bifurcation) measured double oblique. Aortic atherosclerosis. No dissection.  Aortic Valve: Trileaflet with leaflet and annular calcification. Probable aortic stenosis.  Other findings:  Normal pulmonary vein drainage into the left atrium.  Normal left atrial appendage without a thrombus.  Dilated main pulmonary artery to 28 mm, suggestive of pulmonary hypertension.  Mitral annular calcification  IMPRESSION: 1. Diffuse multivessel mixed CAD, possibly significant in the proximal LCx, CADRADS = 3. CT FFR will be performed and reported separately.  2. Coronary calcium score of 624. This was 82nd percentile for age and sex matched control.  3. Normal coronary origin with right dominance.  4. Dilated main pulmonary artery to 28 mm, suggestive of pulmonary hypertension.  5. Mitral annular calcification  6. Aortic annular and leaflet calcification, probable aortic stenosis   Electronically Signed By: Hazle Lites M.D. On: 10/09/2019 21:10  Narrative EXAM: OVER-READ INTERPRETATION  CT CHEST  The following report is an over-read performed by  radiologist Dr. Alexandria Angel of [facility] on 10/09/2019. This over-read does not include interpretation of cardiac or coronary anatomy or pathology. The coronary calcium score/coronary CTA interpretation by the cardiologist is attached.  COMPARISON:  None.  FINDINGS: Aortic atherosclerosis. Within the visualized portions of the thorax there are no suspicious appearing pulmonary nodules or masses, there is no acute consolidative airspace disease, no pleural effusions, no pneumothorax and no lymphadenopathy. Visualized portions of the upper abdomen are unremarkable. There are no aggressive appearing lytic or blastic lesions noted in the visualized portions of the skeleton.  IMPRESSION: 1.  Aortic Atherosclerosis (ICD10-I70.0).  Electronically Signed: By: Alexandria Angel M.D. On: 10/09/2019 15:23     ______________________________________________________________________________________________         Assessment & Plan   Heart failure with preserved ejection fraction (acute on chronic) - no MRA (CKD 4), no SGLT2i due to UTIs  - will increase hydralazine dose, re-dose lasix IV BID 80 mg  Persistent atrial fibrillation - Continue amiodarone 200 mg - Continue low dose metoprolol succinate - was not cleared for AC (anemia requiring transfusion, received IV iron and blood by TRH)  Hypertension - fluctuating; improved on therapy over the weekend, worse today; therpay as above  Chronic kidney disease stage 4 - patient is not planned for HD - with IV diuresis NA and Cr improved - complicated by albumin of  3.2  Nonobstructive coronary artery disease - Mild nonobstructive coronary artery disease with secondary demand ischemia. She is currently on a statin for management.  Iron deficiency anemia - managed by TR[facility]  Goals of Care - no changes to GOC made, discussed this with her in greater detail over the weekend; she is at high risk of re-admission - still  on high O2, TRH working on aspiration PNA therapy which is quite reasonable   For questions or updates, please contact [facility] Please consult [email or web address] for contact info under Cardiology/STEMI.      Gloriann Larger, MD FASE [facility] Cardiologist [facility]  [address] [place], [address] [place], [place] [address] [phone]  9:11 AM

## 2023-05-21 NOTE — Plan of Care (Signed)
  Problem: Education: Goal: Knowledge of General Education information will improve Description: Including pain rating scale, medication(s)/side effects and non-pharmacologic comfort measures Outcome: Progressing   Problem: Clinical Measurements: Goal: Will remain free from infection Outcome: Progressing   Problem: Clinical Measurements: Goal: Diagnostic test results will improve Outcome: Progressing   Problem: Clinical Measurements: Goal: Respiratory complications will improve Outcome: Progressing   Problem: Clinical Measurements: Goal: Cardiovascular complication will be avoided Outcome: Progressing   Problem: Activity: Goal: Risk for activity intolerance will decrease Outcome: Progressing   Problem: Coping: Goal: Level of anxiety will decrease Outcome: Progressing   Problem: Elimination: Goal: Will not experience complications related to bowel motility Outcome: Progressing

## 2023-05-22 DIAGNOSIS — I5033 Acute on chronic diastolic (congestive) heart failure: Secondary | ICD-10-CM | POA: Diagnosis not present

## 2023-05-22 LAB — RENAL FUNCTION PANEL
Albumin: 2 g/dL — ABNORMAL LOW (ref 3.5–5.0)
Anion gap: 8 (ref 5–15)
BUN: 29 mg/dL — ABNORMAL HIGH (ref 8–23)
CO2: 34 mmol/L — ABNORMAL HIGH (ref 22–32)
Calcium: 8.7 mg/dL — ABNORMAL LOW (ref 8.9–10.3)
Chloride: 93 mmol/L — ABNORMAL LOW (ref 98–111)
Creatinine, Ser: 1.5 mg/dL — ABNORMAL HIGH (ref 0.44–1.00)
GFR, Estimated: 34 mL/min — ABNORMAL LOW (ref >60)
Glucose, Bld: 112 mg/dL — ABNORMAL HIGH (ref 70–99)
Phosphorus: 3 mg/dL (ref 2.5–4.6)
Potassium: 4 mmol/L (ref 3.5–5.1)
Sodium: 135 mmol/L (ref 135–145)

## 2023-05-22 LAB — CBC
HCT: 26.4 % — ABNORMAL LOW (ref 36.0–46.0)
Hemoglobin: 8.3 g/dL — ABNORMAL LOW (ref 12.0–15.0)
MCH: 28.5 pg (ref 26.0–34.0)
MCHC: 31.4 g/dL (ref 30.0–36.0)
MCV: 90.7 fL (ref 80.0–100.0)
Platelets: 167 10*3/uL (ref 150–400)
RBC: 2.91 MIL/uL — ABNORMAL LOW (ref 3.87–5.11)
RDW: 16 % — ABNORMAL HIGH (ref 11.5–15.5)
WBC: 8.7 10*3/uL (ref 4.0–10.5)
nRBC: 0 % (ref 0.0–0.2)

## 2023-05-22 LAB — GLUCOSE, CAPILLARY
Glucose-Capillary: 152 mg/dL — ABNORMAL HIGH (ref 70–99)
Glucose-Capillary: 143 mg/dL — ABNORMAL HIGH (ref 70–99)
Glucose-Capillary: 147 mg/dL — ABNORMAL HIGH (ref 70–99)
Glucose-Capillary: 144 mg/dL — ABNORMAL HIGH (ref 70–99)

## 2023-05-22 NOTE — Progress Notes (Signed)
 Heart Failure Stewardship Pharmacist Progress Note   PCP: Corwin Levins, MD PCP-Cardiologist: Bryan Lemma, MD    HPI:  83 yo F with PMH of HTN, HLD, T2DM, CKD3, CAD, paroxysmal afib, recurrent GI bleed, severe aortic stenosis s/p TAVR complicated with transient heart block, respiratory failure on 2L [place] at [place], hypothyroidism, GERD   Patient previously admitted 02/2023 for CHF exacerbation. Patient was diuresed during admission and home lasix was increased from prn to daily.   Presented to the ED on 4/7 with worsening SOB with exertion and leg swelling over the last 2 weeks. Also presented with productive cough, chest pain/tightness, NVD, and wheezing. Reports PCP instructed her to take an additional dose of 20mg  lasix in the setting of weight gain of 3 lb - no improvement per pt. Endorses right flank pain 2/2 coughing.   In the ED, patient was hypertensive and hypoxic (did not present on her home O2) wheezing, rhonchi, rales and BL LE edema present on exam. Slight murmur present. JVD Patient diuresed with lasix 40mg  x2 in the ED. CXR showed mild to moderate pulmonary vascular congestion. No dense consolidation or lung collapse. ECHO 4/8 showed LVEF 60-65% (unchanged from prior 11/2022) with normal LV function. Severe asymmetric LV hypertrophy of septal segment. LV diastolic function could not be evaluated. Normal RV function. Mildly enlarged LV. MV is degenerative with trivial regurgitation. Mild stenosis and severe annular calcifications also present. Mild to moderate tricuspid valve regurgitation. Cardiology was consulted, planning with diuresis for now.   Patient denies SOB, congestion when laying down. Mentions that she feels better than yesterday and that her cough is improving.  Patient oxygen status has improved  to 8 L [place].  Minimal BL leg swelling on exam.   Current HF Medications: Diuretic: IV Lasix 80mg  BID  Beta Blocker: Metoprolol succinate 12.5mg  daily  Other: isosorbide  mononitrate 60mg  daily, hydralazine 75mg  q8h  Prior to admission HF Medications: Diuretic: Lasix 60mg  daily Beta blocker: Metoprolol succinate 12.5mg  daily  Other: Isosorbide mononitrate 15mg  daily, hydralazine 25mg  q8h  Pertinent Lab Values: Serum creatinine 1.74>1.49>1.5 (BL 1.3-1.5?), BUN 39>34>29, Potassium 4.2>3.6>4, Sodium 135, BNP 977.8, Magnesium 1.4>2 (4/10), A1c 6.5   Vital Signs: Weight: (bed) 191> (bed) 183 lbs (admission weight: 191 lbs) Blood pressure: 170/60 - 120-160s/30-60s  Heart rate: 60-80s  I/O: net - yesterday; net -8.77L since admission 6 L HFNC > 3L [place] > 10L HFNC >8L [place]   Medication Assistance / Insurance Benefits Check: Does the patient have prescription insurance?  Yes Type of insurance plan: Medicare  Outpatient Pharmacy:  Prior to admission outpatient pharmacy: [facility]  Is the patient willing to use [facility] TOC pharmacy at discharge? Yes Is the patient willing to transition their outpatient pharmacy to utilize a [facility] outpatient pharmacy? No    Assessment: 1. Acute on chronic diastolic CHF (LVEF 60-65%), due to unknown etiology . NYHA class 2-3 symptoms.Strict I/Os and daily weights. Keep K>4 and Mg>2. Scr improving, reaching baseline.Overall oxygen status improving but not at baseline - due to aspiration PNA or volume overload. Leg swelling improved on exam.  - Continue hydralazine 75mg  q8h  - Continue IV lasix 80mg  BID  - Agree with deferring SGLT2i given hx of 3 UTI in prior 1-2 months  - Defer titrating Metoprolol succinate to 25mg  daily for BP control until euvolemic  - Consider ACE/ARB/ARNI or MRA until renal function stabilizes, confirmed fluid status   Plan: 1) Medication changes recommended at this time: - Monitor O2 status  -  F/u Mg tomorrow  - Monitor renal function and BP trend  2) Patient assistance: - Contacted son and he is interested in utilizing [facility] for medication changes upon discharge  but would [facility] and remaining prescriptions  - Son reported that current medications are affordable  3)  Education  - Patient has been educated on current HF medications and potential additions to HF medication regimen - Patient verbalizes understanding that over the next few months, these medication doses may change and more medications may be added to optimize HF regimen - Patient has been educated on basic disease state pathophysiology and goals of therapy   Harvest Lineman, PharmD PGY1 Pharmacy Resident

## 2023-05-22 NOTE — Progress Notes (Addendum)
 PROGRESS NOTE    Mary Mccarthy  WUJ:[id number]  DOB: [birth year]  DOA: 05/14/2023 PCP: Corwin Levins, MD Outpatient Specialists:   Hospital course:  82/F w hypertension, hyperlipidemia, paroxysmal atrial fibrillation, aortic stenosis sp TAVR, T2DM and obesity who presented with dyspnea and lower extremity edema. In the ED, Hypoxic and Vol overloaded, cr 1,98  BNP 977, troponin 35 and 31 , CXR wpositive cardiomegaly, bilateral hilar vascular congestion with cephalization of the vasculature.  -Slow improvement with diuresis and treatment for reactive airway disease -4/13: Overnight with worsening hypoxia and tachypnea, distress requiring BiPAP -4/14 chest x-ray with worsening airspace opacities compared to admission, started on Unasyn    Subjective:  Patient states thinks that she might be a little bit better than yesterday.  Has less cough.  Thinks her breathing is a little bit easier.   Objective: Vitals:   05/22/23 0818 05/22/23 1145 05/22/23 1445 05/22/23 1515  BP: (!) 122/42 (!) 119/44 (!) 151/42 (!) 140/56  Pulse: 66 76 74 77  Resp: (!) 25 20 (!) 36 20  Temp: 97.6 F (36.4 C) (!) 97.5 F (36.4 C) 98 F (36.7 C) 97.8 F (36.6 C)  TempSrc: Oral Oral Oral Oral  SpO2: 96% 93% 95% 97%  Weight:      Height:        Intake/Output Summary (Last 24 hours) at 05/22/2023 1724 Last data filed at 05/22/2023 1540 Gross per 24 hour  Intake 253 ml  Output 1050 ml  Net -797 ml   Filed Weights   05/20/23 0407 05/21/23 0353 05/22/23 0309  Weight: 85.4 kg 82 kg 83.3 kg     Exam:  General: Weak appearing female lying in bed at 30 degrees, no cough, Eyes: sclera anicteric, conjuctiva mild injection bilaterally CVS: S1-S2, regular  Respiratory: Improved air entry bilaterally with rales persistent at bases GI: NABS, soft, NT  LE: Warm and well-perfused Neuro: A/O x 3,  grossly nonfocal.   Data Reviewed:  Basic Metabolic Panel: Recent Labs  Lab 05/16/23 0258  05/17/23 0320 05/17/23 0836 05/18/23 0317 05/19/23 0306 05/20/23 0305 05/21/23 0307 05/22/23 0330  NA 134*   < >  --  132* 132* 132* 137 135  K 4.1   < >  --  4.1 4.2 4.2 3.6 4.0  CL 96*   < >  --  94* 91* 92* 95* 93*  CO2 27   < >  --  31 30 33* 34* 34*  GLUCOSE 152*   < >  --  266* 185* 109* 128* 112*  BUN 35*   < >  --  32* 36* 39* 34* 29*  CREATININE 1.92*   < >  --  2.17* 1.80* 1.74* 1.49* 1.50*  CALCIUM 9.1   < >  --  8.2* 8.8* 8.6* 8.9 8.7*  MG 1.4*  --  2.0  --   --   --   --   --   PHOS  --   --   --   --   --   --   --  3.0   < > = values in this interval not displayed.    CBC: Recent Labs  Lab 05/18/23 0317 05/19/23 0306 05/20/23 0305 05/21/23 0307 05/22/23 0330  WBC 7.2 10.8* 8.3 7.1 8.7  NEUTROABS  --   --  6.8  --   --   HGB 7.3* 7.3* 8.8* 9.1* 8.3*  HCT 23.1* 23.1* 27.1* 28.6* 26.4*  MCV 92.8 91.7 90.0 91.4  90.7  PLT 159 187 161 147* 167     Scheduled Meds:  amiodarone  200 mg Oral Daily   atorvastatin  40 mg Oral Daily   enoxaparin (LOVENOX) injection  30 mg Subcutaneous Q24H   feeding supplement  237 mL Oral BID BM   fesoterodine  4 mg Oral Daily   furosemide  80 mg Intravenous BID   hydrALAZINE  75 mg Oral Q8H   insulin aspart  0-5 Units Subcutaneous QHS   insulin aspart  0-6 Units Subcutaneous TID WC   insulin glargine-yfgn  20 Units Subcutaneous Daily   ipratropium-albuterol  3 mL Nebulization QID   isosorbide mononitrate  60 mg Oral Daily   lactulose  20 g Oral BID   levothyroxine  25 mcg Oral Daily   metoprolol succinate  12.5 mg Oral Daily   mirtazapine  15 mg Oral QHS   pantoprazole  40 mg Oral BID   sodium chloride flush  3 mL Intravenous Q12H   Continuous Infusions:  ampicillin-sulbactam (UNASYN) IV 3 g (05/22/23 1245)     Assessment & Plan:   Acute hypoxic respiratory failure, worsened overnight requiring BiPAP  Decompensated HFpEF Pulmonary hypertension 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 smoke Bronchitis Patient with some  improvement with initiation of Lasix 80 IV twice daily yesterday O2 has been able to be weaned down to 6 down from 8 L Continue inhaled bronchodilators, has completed course of steroids x 5 days GDMT limited by CKD, no SGLT2 secondary to recurrent UTIs Appreciate ongoing cardiology recommendations  Multifocal pneumonia possibly secondary to aspiration  Continue Unasyn 4/13  for possible aspiration, day #3 today  Atrial fibrillation Controlled on amiodarone metoprolol Declines anticoagulation  HTN  CAD S/P TAVR September 2022 hydralazine increased per cardiology Continue metoprolol  Steroid-induced hyperglycemia Continue glargine  Anxiety and depression Continue mirtazapine and alprazolam per home doses  Hypothyroidism Continue Synthroid   DVT prophylaxis: Lovenox Code Status: DNR Family Communication: None today     Studies: No results found.   Principal Problem:   Acute on chronic diastolic CHF (congestive heart failure) (HCC) Active Problems:   Paroxysmal atrial fibrillation (HCC)   Coronary artery disease, non-occlusive   Hypertensive urgency   CKD (chronic kidney disease), stage IV (HCC)   Type 2 diabetes mellitus with hyperlipidemia (HCC)   Anxiety with depression   Hypothyroidism   Normocytic anemia     Ligaya Cormier Tublu Zakariye Nee, [facility]  If 7PM-7AM, please contact night-coverage [email or web address]   LOS: 8 days

## 2023-05-22 NOTE — Progress Notes (Signed)
 Speech Language Pathology Treatment: Dysphagia  Patient Details Name: Thanya Cegielski MRN: [id number] DOB: 07-Oct-1940 Today's Date: 05/22/2023 Time: 1100-1108 SLP Time Calculation (min) (ACUTE ONLY): 8 min  Assessment / Plan / Recommendation Clinical Impression  Pt seen with her son present throughout the session. She reports her respiratory status feels improved and she has not required BiPAP. She fed herself trials of thin liquids and solids without overt s/s of dysphagia or aspiration. WOB grows mildly increased with mastication. Encouraged pt to choose soft solids or soups from menu, with which she verbalizes understanding. Continue current diet without ongoing SLP f/u.    HPI HPI: MERIEL KELLIHER is an 83 yo female presenting to ED 4/7 with acute on chronic respiratory failure with hypoxia secondary to acute on chronic diastolic CHF. CXR 4/13 showed patchy bilateral airspace disease concerning for multifocal PNA. Placed on BiPAP overnight 4/13. PMH includes HTN, HLD, respiratory failure on 2L, PAF, GI bleed, aortic stenosis s/p TAVR, transient heart block, T2DM, CAD, hypothyroidism, GERD      SLP Plan  All goals met      Recommendations for follow up therapy are one component of a multi-disciplinary discharge planning process, led by the attending physician.  Recommendations may be updated based on patient status, additional functional criteria and insurance authorization.    Recommendations  Diet recommendations: Regular;Thin liquid Liquids provided via: Cup;Straw Medication Administration: Whole meds with liquid Supervision: Patient able to self feed Compensations: Minimize environmental distractions;Slow rate;Small sips/bites Postural Changes and/or Swallow Maneuvers: Seated upright 90 degrees                  Oral care BID   PRN Dysphagia, unspecified (R13.10)     All goals met     Amil Kale, M.A., CCC-SLP Speech Language Pathology, Acute  Rehabilitation Services  Secure Chat preferred [phone]  05/22/2023, 11:16 AM

## 2023-05-22 NOTE — Plan of Care (Signed)
   Problem: Education: Goal: Knowledge of General Education information will improve Description Including pain rating scale, medication(s)/side effects and non-pharmacologic comfort measures Outcome: Progressing

## 2023-05-22 NOTE — Progress Notes (Signed)
 Progress Note  Patient Name: Mary Mccarthy Date of Encounter: 05/22/2023 Primary Cardiologist: Bryan Lemma, MD   Subjective   Overnight feels better. SOB feels back to baseline per patient  Vital Signs    Vitals:   05/21/23 1958 05/22/23 0034 05/22/23 0309 05/22/23 0314  BP: (!) 128/47 (!) 137/32 (!) 163/53 (!) 163/53  Pulse: 72 65 78   Resp: (!) 24 (!) 23 (!) 38 (!) 36  Temp: 97.8 F (36.6 C) 97.6 F (36.4 C) 97.8 F (36.6 C)   TempSrc: Oral Axillary Oral   SpO2: 96% 96% 94% 90%  Weight:   83.3 kg   Height:        Intake/Output Summary (Last 24 hours) at 05/22/2023 0957 Last data filed at 05/22/2023 0820 Gross per 24 hour  Intake 123 ml  Output 1650 ml  Net -1527 ml   Filed Weights   05/20/23 0407 05/21/23 0353 05/22/23 0309  Weight: 85.4 kg 82 kg 83.3 kg    Physical Exam   GEN: Chronically ill appearing Cardiac: RRR, no rubs, or gallops. Prominent systolic crescendo  Respiratory: Decreased breath sounds bilaterally. GI: Soft, nontender, non-distended  MS: Non pitting edema  Labs   Telemetry: SR   Chemistry Recent Labs  Lab 05/20/23 0305 05/21/23 0307 05/22/23 0330  NA 132* 137 135  K 4.2 3.6 4.0  CL 92* 95* 93*  CO2 33* 34* 34*  GLUCOSE 109* 128* 112*  BUN 39* 34* 29*  CREATININE 1.74* 1.49* 1.50*  CALCIUM 8.6* 8.9 8.7*  ALBUMIN  --   --  2.0*  GFRNONAA 29* 34* 34*  ANIONGAP 7 8 8      Hematology Recent Labs  Lab 05/20/23 0305 05/21/23 0307 05/22/23 0330  WBC 8.3 7.1 8.7  RBC 3.01* 3.13* 2.91*  HGB 8.8* 9.1* 8.3*  HCT 27.1* 28.6* 26.4*  MCV 90.0 91.4 90.7  MCH 29.2 29.1 28.5  MCHC 32.5 31.8 31.4  RDW 16.0* 16.1* 16.0*  PLT 161 147* 167    Cardiac EnzymesNo results for input(s): "TROPONINI" in the last 168 hours. No results for input(s): "TROPIPOC" in the last 168 hours.   BNP Recent Labs  Lab 05/20/23 0305  BNP 834.1*     DDimer No results for input(s): "DDIMER" in the last 168 hours.   Cardiac Studies    Cardiac Studies & Procedures   ______________________________________________________________________________________________ CARDIAC CATHETERIZATION  CARDIAC CATHETERIZATION 09/29/2020  Conclusion   Prox RCA lesion is 20% stenosed.   Mid RCA to Dist RCA lesion is 20% stenosed.   Ramus lesion is 30% stenosed.   Mid LAD lesion is 20% stenosed.   Dist LM to Prox LAD lesion is 20% stenosed.  Mild non-obstructive CAD Severe aortic stenosis (mean gradient 52.8 mmHg, peak to peak gradient 58 mmHg, AVA 0.90 cm2).  Recommendations: Will continue workup for TAVR  Findings Coronary Findings Diagnostic  Dominance: Right  Left Main Dist LM to Prox LAD lesion is 20% stenosed.  Left Anterior Descending Vessel is large. Mid LAD lesion is 20% stenosed.  Ramus Intermedius Ramus lesion is 30% stenosed.  Right Coronary Artery Vessel is large. Prox RCA lesion is 20% stenosed. Mid RCA to Dist RCA lesion is 20% stenosed.  Intervention  No interventions have been documented.   CARDIAC CATHETERIZATION  CARDIAC CATHETERIZATION 05/21/2015  Conclusion Images from the original result were not included. 1. Ost Ramus lesion, 60% stenosed. 2. Ost RCA lesion, 30% stenosed. 3. Dist LAD lesion, tapers to a small vessel with diffuse roughly  40-50% stenosis. 4. The left ventricular systolic function is normal. 5. Elevated LVEDP  No culprit lesion noted to explain the patient's elevated troponin. The most significant lesion in the ostial ramus intermedius. This is not PCI target  She has elevated LVEDP, but relatively preserved EF.  Plan:  Return to TCU for monitoring and continued care.  TR band removal per protocol.  Defer further cardiac evaluation and management to Dr. Rennis Golden.    Mary Mccarthy, M.D., M.S. Interventional Cardiologist  Pager # [phone] Phone # [phone] [address] [place], [place] [address]  Findings Coronary Findings Diagnostic   Dominance: Right  Left Main . Vessel is large.  Left Anterior Descending . Vessel is large. Tapers to relatively small caliber vessel distally with diffuse apical disease Diffuse. Vessel is a relatively small at this level with diffuse disease  First Diagonal Branch The vessel is moderate in size and is angiographically normal.  Lateral First Diagonal Branch The vessel is small in size.  First Septal Branch The vessel is small in size.  Second Septal Branch The vessel is small in size.  Third Septal Branch The vessel is small in size.  Ramus Intermedius . Vessel is moderate in size. Discrete located at the major branch.  Lateral Ramus Intermedius The vessel is small in size.  Left Circumflex . Vessel is angiographically normal.  First Obtuse Marginal Branch The vessel is angiographically normal.  Lateral First Obtuse Marginal Branch The vessel is small in size.  Right Coronary Artery . Vessel is large. Discrete.  Acute Marginal Branch The vessel is moderate in size.  First Right Posterolateral Branch The vessel is small in size.  Third Right Posterolateral Branch The vessel is small in size.  Intervention  No interventions have been documented.     ECHOCARDIOGRAM  ECHOCARDIOGRAM COMPLETE 05/15/2023  Narrative ECHOCARDIOGRAM REPORT    Patient Name:   Mary Mccarthy Date of Exam: 05/15/2023 Medical Rec #:  [id number]                Height:       61.0 in Accession #:    [id number]               Weight:       194.9 lb Date of Birth:  1940-07-12                BSA:          1.868 m Patient Age:    82 years                 BP:           155/126 mmHg Patient Gender: F                        HR:           61 bpm. Exam Location:  Inpatient  Procedure: 2D Echo, Cardiac Doppler and Color Doppler (Both Spectral and Color Flow Doppler were utilized during procedure).  Indications:    I50.40* Unspecified combined systolic (congestive) and  diastolic (congestive) heart failure  History:        Patient has prior history of Echocardiogram examinations, most recent 11/13/2022. CHF, CAD, Stroke, Aortic Valve Disease; Risk Factors:Hypertension, Dyslipidemia and Diabetes. TAVR procedure. Aortic Valve: 26 mm Medtronic stented (TAVR) valve is present in the aortic position. Procedure Date: 10/26/2020.  Sonographer:    Sheralyn Boatman RDCS Referring Phys: [id number] [facility] A SMITH  Sonographer Comments: Study delayed, patient in chair IMPRESSIONS   1. Left ventricular ejection fraction, by estimation, is 60 to 65%. The left ventricle has normal function. The left ventricle has no regional wall motion abnormalities. There is severe asymmetric left ventricular hypertrophy of the septal segment. Left ventricular diastolic function could not be evaluated. 2. Right ventricular systolic function is normal. The right ventricular size is mildly enlarged. There is moderately elevated pulmonary artery systolic pressure. The estimated right ventricular systolic pressure is 53.7 mmHg. 3. Left atrial size was mildly dilated. 4. Right atrial size was mildly dilated. 5. The mitral valve is degenerative. Trivial mitral valve regurgitation. Mild mitral stenosis. The mean mitral valve gradient is 3.0 mmHg. Severe mitral annular calcification. 6. Tricuspid valve regurgitation is mild to moderate. 7. Mild paravalvular leak adjacent to the intervalvular fibrosa. The aortic valve has been repaired/replaced. Aortic valve regurgitation is mild. No aortic stenosis is present. There is a 26 mm Medtronic stented (TAVR) valve present in the aortic position. Procedure Date: 10/26/2020. Echo findings are consistent with normal structure and function of the aortic valve prosthesis. Echo findings are consistent with perivalvular leak of the aortic prosthesis. Aortic regurgitation PHT measures 514 msec. Aortic valve mean gradient measures 9.0 mmHg. Aortic valve Vmax measures  1.88 m/s. 8. The inferior vena cava is dilated in size with <50% respiratory variability, suggesting right atrial pressure of 15 mmHg.  Comparison(s): Prior images reviewed side by side. PVL is more prominent, stable RVSP.  FINDINGS Left Ventricle: Left ventricular ejection fraction, by estimation, is 60 to 65%. The left ventricle has normal function. The left ventricle has no regional wall motion abnormalities. Strain was performed and the global longitudinal strain is indeterminate. The left ventricular internal cavity size was normal in size. There is severe asymmetric left ventricular hypertrophy of the septal segment. Left ventricular diastolic function could not be evaluated due to mitral annular calcification (moderate or greater). Left ventricular diastolic function could not be evaluated.  Right Ventricle: The right ventricular size is mildly enlarged. No increase in right ventricular wall thickness. Right ventricular systolic function is normal. There is moderately elevated pulmonary artery systolic pressure. The tricuspid regurgitant velocity is 3.11 m/s, and with an assumed right atrial pressure of 15 mmHg, the estimated right ventricular systolic pressure is 53.7 mmHg.  Left Atrium: Left atrial size was mildly dilated.  Right Atrium: Right atrial size was mildly dilated.  Pericardium: There is no evidence of pericardial effusion.  Mitral Valve: The mitral valve is degenerative in appearance. Severe mitral annular calcification. Trivial mitral valve regurgitation. Mild mitral valve stenosis. MV peak gradient, 11.8 mmHg. The mean mitral valve gradient is 3.0 mmHg with average heart rate of 59 bpm.  Tricuspid Valve: The tricuspid valve is normal in structure. Tricuspid valve regurgitation is mild to moderate. No evidence of tricuspid stenosis.  Aortic Valve: Mild paravalvular leak adjacent to the intervalvular fibrosa. The aortic valve has been repaired/replaced. Aortic valve  regurgitation is mild. Aortic regurgitation PHT measures 514 msec. No aortic stenosis is present. Aortic valve mean gradient measures 9.0 mmHg. Aortic valve peak gradient measures 14.1 mmHg. Aortic valve area, by VTI measures 1.92 cm. There is a 26 mm Medtronic stented (TAVR) valve present in the aortic position. Procedure Date: 10/26/2020. Echo findings are consistent with normal structure and function of the aortic valve prosthesis.  Pulmonic Valve: The pulmonic valve was normal in structure. Pulmonic valve regurgitation is not visualized. No evidence of pulmonic stenosis.  Aorta: The aortic root and  ascending aorta are structurally normal, with no evidence of dilitation.  Venous: The inferior vena cava is dilated in size with less than 50% respiratory variability, suggesting right atrial pressure of 15 mmHg.  IAS/Shunts: The atrial septum is grossly normal.  Additional Comments: 3D was performed not requiring image post processing on an independent workstation and was indeterminate.   LEFT VENTRICLE PLAX 2D LVIDd:         4.60 cm     Diastology LVIDs:         2.90 cm     LV e' medial:    5.55 cm/s LV PW:         1.10 cm     LV E/e' medial:  25.9 LV IVS:        1.50 cm     LV e' lateral:   6.64 cm/s LVOT diam:     2.00 cm     LV E/e' lateral: 21.7 LV SV:         81 LV SV Index:   43 LVOT Area:     3.14 cm  LV Volumes (MOD) LV vol d, MOD A2C: 84.9 ml LV vol d, MOD A4C: 68.3 ml LV vol s, MOD A2C: 27.4 ml LV vol s, MOD A4C: 31.5 ml LV SV MOD A2C:     57.5 ml LV SV MOD A4C:     68.3 ml LV SV MOD BP:      46.8 ml  RIGHT VENTRICLE             IVC RV S prime:     10.90 cm/s  IVC diam: 2.70 cm TAPSE (M-mode): 2.2 cm  LEFT ATRIUM             Index        RIGHT ATRIUM           Index LA diam:        5.20 cm 2.78 cm/m   RA Area:     21.80 cm LA Vol (A2C):   54.6 ml 29.23 ml/m  RA Volume:   64.90 ml  34.74 ml/m LA Vol (A4C):   62.4 ml 33.40 ml/m LA Biplane Vol: 59.4 ml 31.80  ml/m AORTIC VALVE AV Area (Vmax):    1.93 cm AV Area (Vmean):   1.83 cm AV Area (VTI):     1.92 cm AV Vmax:           187.67 cm/s AV Vmean:          122.000 cm/s AV VTI:            0.420 m AV Peak Grad:      14.1 mmHg AV Mean Grad:      9.0 mmHg LVOT Vmax:         115.00 cm/s LVOT Vmean:        71.000 cm/s LVOT VTI:          0.257 m LVOT/AV VTI ratio: 0.61 AI PHT:            514 msec  AORTA Ao Root diam: 3.30 cm Ao Asc diam:  3.25 cm  MITRAL VALVE                TRICUSPID VALVE MV Area (PHT): 4.60 cm     TR Peak grad:   38.7 mmHg MV Area VTI:   1.66 cm     TR Vmax:        311.00 cm/s MV Peak grad:  11.8 mmHg  MV Mean grad:  3.0 mmHg     SHUNTS MV Vmax:       1.72 m/s     Systemic VTI:  0.26 m MV Vmean:      74.7 cm/s    Systemic Diam: 2.00 cm MV Decel Time: 165 msec MV E velocity: 144.00 cm/s MV A velocity: 71.80 cm/s MV E/A ratio:  2.01  Riley Lam MD Electronically signed by Riley Lam MD Signature Date/Time: 05/15/2023/5:44:08 PM    Final    MONITORS  LONG TERM MONITOR-LIVE TELEMETRY (3-14 DAYS) 11/19/2020  Narrative  Predominantly sinus rhythm with minimum heart rate of 49 bpm, maximum 114 bpm. Average 68 bpm.  Total of 206 (Nonsustained Ventricular Tachycardia-V. tach fastest 5 beats continue 39 bpm. Longest 9.3 seconds average rate 1 3 5  bpm.  A. fib burden (16%) rate range 62-139 bpm with average rate 105 bpm. Longest was for 17 hr 55 min. Some aberrant conduction suspected.  Rare isolated PACs and PVCs. Some PVC couplets and triplets were noted.  No high-grade AV block noted.  Bursts of V. tach could be A. fib with aberrancy.   Patch Wear Time:  12 days and 19 hours (2022-09-22T09:52:09-398 to 2022-10-05T05:32:17-0400)  Abnormal monitor.  Still shows a significant mount of atrial fibrillation and short burst of nonsustained V. tach.  Thankfully, no high-grade AV block.  I think we can continue amiodarone and probably  titrate up beta-blocker.   Bryan Lemma, MD   CT SCANS  CT CORONARY MORPH W/CTA COR W/SCORE 09/28/2020  Addendum 09/29/2020  1:17 PM ADDENDUM REPORT: 09/29/2020 13:14  CLINICAL DATA:  Pre-op transcatheter aortic valve replacement (TAVR)  EXAM: Cardiac TAVR CT  TECHNIQUE: The patient was scanned on a Siemens Force 192 slice scanner. A 120 kV retrospective scan was triggered in the descending thoracic aorta at 111 HU's. Gantry rotation speed was 270 msecs and collimation was .9 mm. The 3D data set was reconstructed in 5% intervals of the R-R cycle. Systolic and diastolic phases were analyzed on a dedicated work station using MPR, MIP and VRT modes. The patient received 95mL OMNIPAQUE IOHEXOL 350 MG/ML SOLN of contrast.  FINDINGS: Aortic Valve: Tricuspid aortic valve. Severely reduced cusp separation. Severely thickened, moderately calcified aortic valve cusps.  AV calcium score: 1049  Virtual Basal Annulus Measurements:  Maximum/Minimum Diameter: 23.1 mm x 18.9 mm  Perimeter: 65.4 mm  Area: 328 mm2  No significant LVOT calcifications.  Based on these measurements, the annulus would be suitable for a 20 mm Edwards Sapien 3 valve vs 23 mm Medtronic Evolut Pro valve. Sinus dimensions are borderline for 26 mm Medtronic Evolut Pro valve. Recommend Heart Team discussion for valve sizing.  Sinus of Valsalva Measurements:  Non-coronary:  26 mm  Right - coronary:  26 mm  Left - coronary:  27 mm  Sinus of Valsalva Height:  Left: 17.5 mm  Right: 18.6 mm  Aorta: Mild aortic atherosclerosis.  Sinotubular Junction: 27 mm  Ascending Thoracic Aorta:  31 mm  Aortic Arch:  24 mm  Descending Thoracic Aorta:  22 mm  Coronary Artery Height above Annulus:  Left Main: 13 mm  Right Coronary: 11.6 mm  Coronary Arteries: Normal origins. 3 vessel coronary artery calcifications.  Optimum Fluoroscopic Angle for Delivery: RAO 1, CRA 1  Moderate mitral annular  calcification.  No left atrial appendage thrombus.  IMPRESSION: 1. Tricuspid aortic valve. Severely reduced cusp separation. Severely thickened, moderately calcified aortic valve cusps.  2.  AV calcium score: 1049  3.  Annulus area: 328 mm2, no significant LVOT calcifications. Based on these measurements, the annulus would be suitable for a 20 mm Edwards Sapien 3 valve vs 23 mm Medtronic Evolut Pro valve. Sinus dimensions are borderline for 26 mm Medtronic Evolut Pro valve. Recommend Heart Team discussion for valve sizing.  4.  Sufficient coronary artery height from annulus.  5.  Optimum Fluoroscopic Angle for Delivery: RAO 1, CRA 1   Electronically Signed By: Grady Lawman M.D. On: 09/29/2020 13:14  Narrative EXAM: OVER-READ INTERPRETATION  CT CHEST  The following report is an over-read performed by radiologist Dr. Alexandria Angel of [facility] on 09/29/2020. This over-read does not include interpretation of cardiac or coronary anatomy or pathology. The coronary calcium score/coronary CTA interpretation by the cardiologist is attached.  COMPARISON:  Cardiac CTA 10/09/2019.  FINDINGS: Extracardiac findings will be described separately under dictation for contemporaneously obtained CTA chest, abdomen and pelvis.  IMPRESSION: Please see separate dictation for contemporaneously obtained CTA chest, abdomen and pelvis dated 09/28/2020 for full description of relevant extracardiac findings.  Electronically Signed: By: Alexandria Angel M.D. On: 09/29/2020 06:07   CT SCANS  CT CORONARY FRACTIONAL FLOW RESERVE DATA PREP 10/09/2019  Narrative EXAM: CT FFR ANALYSIS  CLINICAL DATA:  Chest pain/anginal equiv, 4yr CHD risk 10-20%, not treadmill candidate  FINDINGS: FFRct analysis was performed on the original cardiac CT angiogram dataset. Diagrammatic representation of the FFRct analysis is provided in a separate PDF document in PACS. This dictation  was created using the PDF document and an interactive 3D model of the results. 3D model is not available in the EMR/PACS. Normal FFR range is >0.80.  1. Left Main:  No significant stenosis. FFR = 0.99  2. LAD: No significant stenosis. Proximal FFR = 0.97, Mid FFR = 0.90, Distal FFR = 0.85 3. LCX: No significant stenosis. Proximal FFR = 0.98, Distal FFR = 0.91 4. RCA: No significant stenosis. Proximal FFR = 0.96, Mid FFR = 0.92, Distal FFR = 0.91  IMPRESSION: 1.  CT FFR analysis did not show any significant stenosis.   Electronically Signed By: Hazle Lites M.D. On: 10/10/2019 23:18   CT CORONARY MORPH W/CTA COR W/SCORE 10/09/2019  Addendum 10/09/2019  9:12 PM ADDENDUM REPORT: 10/09/2019 21:10  HISTORY: 83 yo female with chest pain/anginal equiv, 4yr CHD risk 10-20%, not treadmill candidate  EXAM: Cardiac/Coronary CTA  TECHNIQUE: The patient was scanned on a [employer].  PROTOCOL: A 120 kV prospective scan was triggered in the descending thoracic aorta at 111 HU's. Axial non-contrast 3 mm slices were carried out through the heart. The data set was analyzed on a dedicated work station and scored using the Agatson method. Gantry rotation speed was 250 msecs and collimation was .6 mm. Beta blockade and 0.8 mg of sl NTG was given. The 3D data set was reconstructed in 5% intervals of the 67-82 % of the R-R cycle. Diastolic phases were analyzed on a dedicated work station using MPR, MIP and VRT modes. The patient received 80mL OMNIPAQUE IOHEXOL 350 MG/ML SOLN of contrast.  FINDINGS: Quality: Fair, HR 59  Coronary calcium score: The patient's coronary artery calcium score is 624, which places the patient in the 82nd percentile.  Coronary arteries: Normal coronary origins.  Right dominance.  Right Coronary Artery: Dominant. Gives off large R-PDA and R-PLB branches. Mild 25-49% proximal mixed stenosis (CADRADS 2). No significant distal or branch  disease.  Left Main Coronary Artery: Normal left main. Bifurcates into the LAD and LCx  branches.  Left Anterior Descending Coronary Artery: Moderate sized anterior artery which gives off a mid-vessel diagonal branch. There is diffuse calcified plaque with mild 25-49% proximal stenosis (CADRADS3) and minimal mixed mid to distal vessel stenosis (<25% - CADRADS1).  Ramus intermedius: Small branch <2.0 mm, mild to moderate ostial disease  Left Circumflex Artery: AV groove vessel. There is a moderate mixed 50-69% proximal stenosis (CADRADS3). Small distal OM branch without disease.  Aorta: Normal size, 30 mm at the mid ascending aorta (level of the PA bifurcation) measured double oblique. Aortic atherosclerosis. No dissection.  Aortic Valve: Trileaflet with leaflet and annular calcification. Probable aortic stenosis.  Other findings:  Normal pulmonary vein drainage into the left atrium.  Normal left atrial appendage without a thrombus.  Dilated main pulmonary artery to 28 mm, suggestive of pulmonary hypertension.  Mitral annular calcification  IMPRESSION: 1. Diffuse multivessel mixed CAD, possibly significant in the proximal LCx, CADRADS = 3. CT FFR will be performed and reported separately.  2. Coronary calcium score of 624. This was 82nd percentile for age and sex matched control.  3. Normal coronary origin with right dominance.  4. Dilated main pulmonary artery to 28 mm, suggestive of pulmonary hypertension.  5. Mitral annular calcification  6. Aortic annular and leaflet calcification, probable aortic stenosis   Electronically Signed By: Hazle Lites M.D. On: 10/09/2019 21:10  Narrative EXAM: OVER-READ INTERPRETATION  CT CHEST  The following report is an over-read performed by radiologist Dr. Alexandria Angel of [facility] on 10/09/2019. This over-read does not include interpretation of cardiac or coronary anatomy or pathology. The  coronary calcium score/coronary CTA interpretation by the cardiologist is attached.  COMPARISON:  None.  FINDINGS: Aortic atherosclerosis. Within the visualized portions of the thorax there are no suspicious appearing pulmonary nodules or masses, there is no acute consolidative airspace disease, no pleural effusions, no pneumothorax and no lymphadenopathy. Visualized portions of the upper abdomen are unremarkable. There are no aggressive appearing lytic or blastic lesions noted in the visualized portions of the skeleton.  IMPRESSION: 1.  Aortic Atherosclerosis (ICD10-I70.0).  Electronically Signed: By: Alexandria Angel M.D. On: 10/09/2019 15:23     ______________________________________________________________________________________________         Assessment & Plan   Acute on Chronic HF Preserved EF - on no MRA due to CKD stage 4 - no SGLT2i due to UTIs - on exam was able to wean her down to 6 L O2 - if planned for DC today, will switch her to high dose torsemide (40 mg PO BID) - complicated by hypo-albuminemia  HTN - Improved with increased afterload reduction, no changes made today  CAD - non obstructive, asymptomatic - no statin for HLD and risk factor modification  Persistent AF - complicated by iron deficiency anemia (no AC planned at DC) - continue low dose BB and amiodarone  GOC - DNR limited; we discuss overall  prognosis   For questions or updates, please contact [facility] Please consult [email or web address] for contact info under Cardiology/STEMI.      Gloriann Larger, MD FASE [facility] Cardiologist [facility]  [address] [place], [address] [place], [place] [address] [phone]  9:57 AM

## 2023-05-23 DIAGNOSIS — J690 Pneumonitis due to inhalation of food and vomit: Secondary | ICD-10-CM

## 2023-05-23 DIAGNOSIS — I480 Paroxysmal atrial fibrillation: Secondary | ICD-10-CM | POA: Diagnosis not present

## 2023-05-23 DIAGNOSIS — I5033 Acute on chronic diastolic (congestive) heart failure: Secondary | ICD-10-CM | POA: Diagnosis not present

## 2023-05-23 DIAGNOSIS — I160 Hypertensive urgency: Secondary | ICD-10-CM | POA: Diagnosis not present

## 2023-05-23 DIAGNOSIS — I2510 Atherosclerotic heart disease of native coronary artery without angina pectoris: Secondary | ICD-10-CM | POA: Diagnosis not present

## 2023-05-23 LAB — GLUCOSE, CAPILLARY
Glucose-Capillary: 99 mg/dL (ref 70–99)
Glucose-Capillary: 113 mg/dL — ABNORMAL HIGH (ref 70–99)
Glucose-Capillary: 119 mg/dL — ABNORMAL HIGH (ref 70–99)
Glucose-Capillary: 128 mg/dL — ABNORMAL HIGH (ref 70–99)

## 2023-05-23 LAB — BASIC METABOLIC PANEL WITH GFR
Anion gap: 6 (ref 5–15)
BUN: 27 mg/dL — ABNORMAL HIGH (ref 8–23)
CO2: 35 mmol/L — ABNORMAL HIGH (ref 22–32)
Calcium: 8.6 mg/dL — ABNORMAL LOW (ref 8.9–10.3)
Chloride: 93 mmol/L — ABNORMAL LOW (ref 98–111)
Creatinine, Ser: 1.55 mg/dL — ABNORMAL HIGH (ref 0.44–1.00)
GFR, Estimated: 33 mL/min — ABNORMAL LOW (ref >60)
Glucose, Bld: 110 mg/dL — ABNORMAL HIGH (ref 70–99)
Potassium: 3.8 mmol/L (ref 3.5–5.1)
Sodium: 134 mmol/L — ABNORMAL LOW (ref 135–145)

## 2023-05-23 LAB — MAGNESIUM: Magnesium: 2 mg/dL (ref 1.7–2.4)

## 2023-05-23 MED ORDER — FUROSEMIDE 10 MG/ML IJ SOLN
100.0000 mg | Freq: Two times a day (BID) | INTRAVENOUS | Status: DC
  Administered 2023-05-23 – 2023-05-24 (×2): 100 mg via INTRAVENOUS
  Filled 2023-05-23 (×3): qty 10

## 2023-05-23 MED ORDER — AMOXICILLIN-POT CLAVULANATE 500-125 MG PO TABS
1.0000 | ORAL_TABLET | Freq: Two times a day (BID) | ORAL | Status: AC
  Administered 2023-05-23 – 2023-05-26 (×7): 1 via ORAL
  Filled 2023-05-23 (×7): qty 1

## 2023-05-23 NOTE — Plan of Care (Signed)
   Problem: Education: Goal: Knowledge of General Education information will improve Description Including pain rating scale, medication(s)/side effects and non-pharmacologic comfort measures Outcome: Progressing

## 2023-05-23 NOTE — Progress Notes (Signed)
 Progress Note   Patient: Mary Mccarthy WUJ:[id number] DOB: Jun 25, 1940 DOA: 05/14/2023     9 DOS: the patient was seen and examined on 05/23/2023   Brief hospital course: Mary Mccarthy was admitted to the hospital with the working diagnosis of heart failure exacerbation.  83 yo female with the past medical history of hypertension, hyperlipidemia, paroxysmal atrial fibrillation, aortic stenosis sp TAVR, T2DM and obesity who presented with dyspnea and lower extremity edema. Reported 2 weeks of worsening symptoms, along with weight gain. Her dyspnea and edema were persistent despite increasing the dose of her oral furosemide at home.  At the time of her initial physical examination her 02 saturation was 75% on room air, blood pressure 196/70, HR 62, RR 30.  Lungs with decreased breath sounds and rales bilaterally, up to mid zones, heart with S1 and S2 present and regular, abdomen with no distention and positive bilateral lower extremity, ++ pitting edema.   Na 135, K 4,1 Cl 100, bicarbonate 25 glucose 109, bun 24 cr 1,98  BNP 977 High sensitive troponin 35 and 31  Lactic acid 2,0 and 1,0  Wbc 5,2 hgb 8,1 plt 223  TSH 5,6  Sars covid 19 negative  Influenza negative  RSV negative  Urine analysis SG 1,014, protein 30, negative leukocytes and negative hgb   Chest radiograph with hypoinflation, positive cardiomegaly, bilateral hilar vascular congestion with cephalization of the vasculature.   EKG 62 bpm, right axis deviation, qtc 494, sinus rhythm with poor R R wave progression with no significant ST segment or T wave changes.   Patient was placed on aggressive diuresis.  04/13 developed aspiration pneumonia and was placed on antibiotic therapy.   Assessment and Plan: * Acute on chronic diastolic CHF (congestive heart failure) (HCC) Echocardiogram with preserved LV systolic function with EF 60 to 65%, severe LVH, RV systolic function preserved, RVSP 53.7 mmHg, LA and RA with mild  dilatation, mild mitral stenosis, mild to moderate tricuspid valve regurgitation, sp TAVR with mild paravalvular leak   Documented urine output 600 ml, since admission negative fluid balance - 8,503 ml.   Plan to continue diuresis with IV furosemide 100 mg IV bid Afterload reduction with hydralazine and isosorbide.  B blocker with metoprolol.  Limited pharmacologic therapy due to reduced GFR.   Paroxysmal atrial fibrillation (HCC) Continue rhythm control with amiodarone and rate control with metoprolol.  Patient not on anticoagulation.   Coronary artery disease, non-occlusive Continue atorvastatin and blood pressure control   Hypertensive urgency Continue blood pressure control with metoprolol, hydralazine and isosorbide.   CKD (chronic kidney disease), stage IV (HCC) Hyponatremia.   Renal function today with serum cr 1,55 with K at 3,8 and serum bicarbonate at 35  Na 134 and Mg 2,0   Follow up renal function and electrolytes Continue diuresis with IV furosemide for volume overload.   Type 2 diabetes mellitus with hyperlipidemia (HCC) Continue insulin sliding scale for glucose cover and monitoring  Continue statin   Anxiety with depression Continue mirtazapine and alprazolam.   Hypothyroidism Continue with levothyroxine   Normocytic anemia Cell count has been stable.   Aspiration pneumonia (HCC) 04/13 radiograph with multiple patchy bilateral infiltrates.  Patient afebrile and no leukocytosis  She has been on Unasyn, will transition to po Augmentin          Subjective: Patient with improvement in dyspnea and edema, continue very weak and deconditioned, she is sitting in the chair   Physical Exam: Vitals:   05/23/23 0040 05/23/23 0446  05/23/23 1000 05/23/23 1534  BP: (!) 138/46 (!) 168/49 (!) 148/45 (!) 133/43  Pulse: 67 69 67   Resp: 20 20 20    Temp: 98.3 F (36.8 C) 97.6 F (36.4 C) 98 F (36.7 C)   TempSrc: Oral Oral Oral   SpO2: 99% 95% 97%    Weight: 82.6 kg 82.2 kg    Height:       Neurology awake and alert, deconditioned and ill looking appearing  ENT with mild pallor with no icterus Cardiovascular with S1 and S2 present and regular with no gallops, rubs, positive systolic murmur at the right lower sternal border.  No JVD No lower extremity edema Respiratory with poor inspiratory effort with no rhonchi or wheezing, positive rales at bases.  Abdomen with no distention  Data Reviewed:    Family Communication: no family at the bedside   Disposition: Status is: Inpatient Remains inpatient appropriate because: diuresis   Planned Discharge Destination: Home    Author: Albertus Alt, MD 05/23/2023 4:50 PM  For on call review [email or web address].

## 2023-05-23 NOTE — Progress Notes (Signed)
 Occupational Therapy Treatment Patient Details Name: Mary Mccarthy MRN: [id number] DOB: 03-19-1940 Today's Date: 05/23/2023   History of present illness Patient is a 83 year old female with acute on chronic respiratory failure with hypoxia secondary to acute on chronic diastolic CHF. Medical history of HTN, hyperlipidemia, respiratory failure on 2 L, PAF, GI bleed, aortic stenosis s/p TAVR,  transient heart block, diabetes mellitus type II, CAD, hypothyroidism, GERD   OT comments  The pt was seen for progression of functional activity and functional strengthening, needed to facilitate progressive ADL performance. She was received seated in the bedside chair. She subsequently required CGA to min assist for sock management seated in the chair, to stand using a RW, to perform a step pivot transfer to the bed using a RW and for sit to supine. Once in bed, she performed upper body dressing/doffing then donning a hospital gown. OT reinforced pacing with activity and implementing rest breaks as needed. She is making gradual functional progress. Continue OT plan of care.       If plan is discharge home, recommend the following:  Help with stairs or ramp for entrance;A little help with bathing/dressing/bathroom;A little help with walking and/or transfers;Assistance with cooking/housework;Assist for transportation   Equipment Recommendations  None recommended by OT    Recommendations for Other Services      Precautions / Restrictions Precautions Precautions: Fall Precaution/Restrictions Comments: monitor O2 Restrictions Weight Bearing Restrictions Per Provider Order: No       Mobility Bed Mobility Overal bed mobility: Needs Assistance Bed Mobility: Sit to Supine       Sit to supine: Min assist        Transfers Overall transfer level: Needs assistance Equipment used: Rolling walker (2 wheels) Transfers: Sit to/from Stand Sit to Stand: Min assist                  Balance     Sitting balance-[name]: Fair         Standing balance comment: CGA with RW         ADL either performed or assessed with clinical judgement   ADL Overall ADL's : Needs assistance/impaired Eating/Feeding: Set up;Bed level Eating/Feeding Details (indicate cue type and reason): She drank water from a cup in bed.             Upper Body Dressing : Minimal assistance Upper Body Dressing Details (indicate cue type and reason): She required assist to doff a hospital gown, then to donn another clean one in bed. Lower Body Dressing: Minimal assistance Lower Body Dressing Details (indicate cue type and reason): for pulling up her socks while seated in the bedside chair; she flexed at the hips to perform                              Communication Communication Communication: Impaired Factors Affecting Communication: Hearing impaired   Cognition Arousal: Alert Behavior During Therapy: WFL for tasks assessed/performed Cognition: No apparent impairments      Following commands: Intact                      Pertinent Vitals/ Pain       Pain Assessment Pain Assessment: No/denies pain   Frequency  Min 2X/week        Progress Toward Goals  OT Goals(current goals can now be found in the care plan section)  Progress towards OT goals: Progressing toward goals  Acute Rehab OT Goals Patient Stated Goal: to get better and return home OT Goal Formulation: With patient Time For Goal Achievement: 05/29/23 Potential to Achieve Goals: Good  Plan         AM-PAC OT "6 Clicks" Daily Activity     Outcome Measure   Help from another person eating meals?: None Help from another person taking care of personal grooming?: A Little Help from another person toileting, which includes using toliet, bedpan, or urinal?: A Little Help from another person bathing (including washing, rinsing, drying)?: A Little Help from another person to put on and taking  off regular upper body clothing?: A Little Help from another person to put on and taking off regular lower body clothing?: A Little 6 Click Score: 19    End of Session Equipment Utilized During Treatment: Rolling walker (2 wheels);Oxygen  OT Visit Diagnosis: Unsteadiness on feet (R26.81);Muscle weakness (generalized) (M62.81)   Activity Tolerance Patient tolerated treatment well   Patient Left in bed;with call bell/phone within reach;with bed alarm set   Nurse Communication Mobility status        Time: 1610-9604 OT Time Calculation (min): 20 min  Charges: OT General Charges $OT Visit: 1 Visit OT Treatments $Therapeutic Activity: 8-22 mins     Sheralyn Dies, OTR/L 05/23/2023, 3:59 PM

## 2023-05-23 NOTE — Progress Notes (Signed)
 Heart Failure Nurse Navigator Progress Note   Patients original HF TOC appointment on 05/25/2023 was rescheduled to 06/04/2023 @ 11 :45 am due to patient is still hospitalized. A new appointment card to be given to patient, and AVS was updated with new appointment.   Randie Bustle, BSN, Scientist, clinical (histocompatibility and immunogenetics) Only

## 2023-05-23 NOTE — Assessment & Plan Note (Addendum)
 04/13 radiograph with multiple patchy bilateral infiltrates.  Patient afebrile and no leukocytosis  Continue with po Augmentin   Continue airway clearing techniques with flutter valve and incentive spirometer.   Possible organizing pneumonia, holding amiodarone  and on high dose systemic corticosteroids.  Possible follow up CT chest non contrast in the next 48 hrs.

## 2023-05-23 NOTE — Progress Notes (Signed)
 Physical Therapy Treatment Patient Details Name: Mary Mccarthy MRN: [id number] DOB: 1940-02-28 Today's Date: 05/23/2023   History of Present Illness Patient is a 83 year old female with acute on chronic respiratory failure with hypoxia secondary to acute on chronic diastolic CHF. Pt found to have aspiration PNA. History of HTN, hyperlipidemia, respiratory failure on 2 L, PAF, GI bleed, aortic stenosis s/p TAVR,  transient heart block, diabetes mellitus type II, CAD, hypothyroidism, GERD    PT Comments  Pt greeted supine in bed, pleasant and agreeable to PT session. She required modA for supine>sit and CGA for OOB using RW. Pt engaged in two short bouts of ambulation with a chair follow. The total distance was ~39ft which is less than her previous session of ~81ft. Her primarily limiting factor is cardiopulmonary endurance. Pt desaturated to 79% SpO2 while on 10L O2 via [place], required bump up to 15L, prolonged seated rest break, and cues for PLB technique to recover. She was able to be titrated back to 10L with SpO2 >90%. Will continue to follow acutely and advance appropriately.      If plan is discharge home, recommend the following: Assist for transportation;Help with stairs or ramp for entrance;Assistance with cooking/housework;A little help with bathing/dressing/bathroom;A lot of help with walking and/or transfers   Can travel by private vehicle        Equipment Recommendations  Wheelchair (measurements PT);Wheelchair cushion (measurements PT);BSC/3in1    Recommendations for Other Services       Precautions / Restrictions Precautions Precautions: Fall Recall of Precautions/Restrictions: Intact Precaution/Restrictions Comments: watch SpO2 Restrictions Weight Bearing Restrictions Per Provider Order: No     Mobility  Bed Mobility Overal bed mobility: Needs Assistance Bed Mobility: Supine to Sit     Supine to sit: HOB elevated, Mod assist, Used rails     General bed  mobility comments: Pt sat up on L side of bed with increased time. She brought BLE off EOB and utilized bedrail. Assist needed to elevated trunk, pivot hips, and scoot fwd with use of bed pad.    Transfers Overall transfer level: Needs assistance Equipment used: Rolling walker (2 wheels) Transfers: Sit to/from Stand Sit to Stand: Contact guard assist           General transfer comment: Pt stood from lowest bed height and recliner chair. VC for proper hand positioning using RW. Pt took increased time to acheive standing and lacked full trunk/hip ext. Good eccentric control with sitting.    Ambulation/Gait Ambulation/Gait assistance: Contact guard assist, +2 safety/equipment, Min assist (Chair Follow) Gait Distance (Feet): 10 Feet (1x10, prolonged seated rest break, 1x8) Assistive device: Rolling walker (2 wheels) Gait Pattern/deviations: Step-to pattern, Decreased step length - right, Decreased step length - left, Trunk flexed, Narrow base of support Gait velocity: decreased Gait velocity interpretation: <1.31 ft/sec, indicative of household ambulator   General Gait Details: Pt ambulated with very short slow steps. She maintained a fwd flex posture with RW too far infront of her. VC/TC for correction with no adjustment made. MinA to reposition AD closer to pt for safety. Pt kept her feet close together and was unable to pass the opposite foot and displayed limited foot clearence. Distance limited d/t SOB and DOE.   Stairs             Wheelchair Mobility     Tilt Bed    Modified Rankin (Stroke Patients Only)       Balance Overall balance assessment: Needs assistance Sitting-balance support: Feet supported  Sitting balance-[name]: Fair Sitting balance - Comments: Pt sat EOB with supervision.   Standing balance support: Bilateral upper extremity supported, During functional activity, Reliant on assistive device for balance Standing balance-[name]:  Poor Standing balance comment: Pt is dependent on RW.                            Communication Communication Communication: Impaired Factors Affecting Communication: Hearing impaired  Cognition Arousal: Alert Behavior During Therapy: WFL for tasks assessed/performed   PT - Cognitive impairments: No family/caregiver present to determine baseline, Awareness, Safety/Judgement                       PT - Cognition Comments: Pt appears to lack full understanding of her current medical condition. She required PT to facilitate seated rest breaks. Pt reported a 5/10 on the modified RPE scale. Re-educated pt on the ranking system and how her performance with task and difficulty during mobility would make this PT assign a greater score. Following commands: Intact      Cueing Cueing Techniques: Verbal cues  Exercises      General Comments General comments (skin integrity, edema, etc.): Pt greeted on 10L HFNC. She desaturated to 79% SpO2 during gait, requiring prolonged seated rest, bump up to 15L, and cue for PLB technique. Titrated back to 10L. Pt reported a stuffy nose and had several tissues with thick yellow mucous on her table.      Pertinent Vitals/Pain Pain Assessment Pain Assessment: No/denies pain    Home Living                          Prior Function            PT Goals (current goals can now be found in the care plan section) Acute Rehab PT Goals Patient Stated Goal: Breath and Move easier Progress towards PT goals: Progressing toward goals    Frequency    Min 2X/week      PT Plan      Co-evaluation              AM-PAC PT "6 Clicks" Mobility   Outcome Measure  Help needed turning from your back to your side while in a flat bed without using bedrails?: A Lot Help needed moving from lying on your back to sitting on the side of a flat bed without using bedrails?: A Lot Help needed moving to and from a bed to a chair (including  a wheelchair)?: A Little Help needed standing up from a chair using your arms (e.g., wheelchair or bedside chair)?: A Little Help needed to walk in hospital room?: Total Help needed climbing 3-5 steps with a railing? : Total 6 Click Score: 12    End of Session Equipment Utilized During Treatment: Gait belt;Oxygen Activity Tolerance: Patient limited by fatigue Patient left: in chair;with call bell/phone within reach;with chair alarm set;with family/visitor present Nurse Communication: Mobility status;Other (comment) (RN and MD aware of desaturation with mobility.) PT Visit Diagnosis: Muscle weakness (generalized) (M62.81);Unsteadiness on feet (R26.81)     Time: 4098-1191 PT Time Calculation (min) (ACUTE ONLY): 29 min  Charges:    $Gait Training: 8-22 mins $Therapeutic Activity: 8-22 mins PT General Charges $$ ACUTE PT VISIT: 1 Visit                     Glenford Lanes, PT, DPT Acute  Rehabilitation Services Office: [phone] Secure Chat Preferred  Riva Chester 05/23/2023, 12:30 PM

## 2023-05-23 NOTE — Progress Notes (Addendum)
 Progress Note  Patient Name: [name] Date of Encounter: 05/23/2023 Primary Cardiologist: Bryan Lemma, MD   Subjective   Patient desaturation to 79% with activity- she required 15 L to recover.  Made it less than 30 steps out of her room.  Vital Signs    Vitals:   05/22/23 2019 05/23/23 0040 05/23/23 0446 05/23/23 1000  BP:  (!) 138/46 (!) 168/49 (!) 148/45  Pulse: 70 67 69 67  Resp: 18 20 20 20   Temp:  98.3 F (36.8 C) 97.6 F (36.4 C) 98 F (36.7 C)  TempSrc:  Oral Oral Oral  SpO2: 94% 99% 95% 97%  Weight:  82.6 kg 82.2 kg   Height:        Intake/Output Summary (Last 24 hours) at 05/23/2023 1036 Last data filed at 05/23/2023 1007 Gross per 24 hour  Intake 370 ml  Output 100 ml  Net 270 ml   Filed Weights   05/22/23 0309 05/23/23 0040 05/23/23 0446  Weight: 83.3 kg 82.6 kg 82.2 kg    Physical Exam   GEN: Chronically ill appearing Cardiac: RRR, no rubs, or gallops. Systolic murmur Respiratory: Coarse breath sounds bilaterally GI: Soft, nontender, non-distended  MS: Non pitting edema  Labs   Telemetry: SR-> sinus tachycardia   Chemistry Recent Labs  Lab 05/21/23 0307 05/22/23 0330 05/23/23 0237  NA 137 135 134*  K 3.6 4.0 3.8  CL 95* 93* 93*  CO2 34* 34* 35*  GLUCOSE 128* 112* 110*  BUN 34* 29* 27*  CREATININE 1.49* 1.50* 1.55*  CALCIUM 8.9 8.7* 8.6*  ALBUMIN  --  2.0*  --   GFRNONAA 34* 34* 33*  ANIONGAP 8 8 6      Hematology Recent Labs  Lab 05/20/23 0305 05/21/23 0307 05/22/23 0330  WBC 8.3 7.1 8.7  RBC 3.01* 3.13* 2.91*  HGB 8.8* 9.1* 8.3*  HCT 27.1* 28.6* 26.4*  MCV 90.0 91.4 90.7  MCH 29.2 29.1 28.5  MCHC 32.5 31.8 31.4  RDW 16.0* 16.1* 16.0*  PLT 161 147* 167    Cardiac EnzymesNo results for input(s): "TROPONINI" in the last 168 hours. No results for input(s): "TROPIPOC" in the last 168 hours.   BNP Recent Labs  Lab 05/20/23 0305  BNP 834.1*     DDimer No results for input(s): "DDIMER" in the last  168 hours.   Cardiac Studies   Cardiac Studies & Procedures   ______________________________________________________________________________________________ CARDIAC CATHETERIZATION  CARDIAC CATHETERIZATION 09/29/2020  Conclusion   Prox RCA lesion is 20% stenosed.   Mid RCA to Dist RCA lesion is 20% stenosed.   Ramus lesion is 30% stenosed.   Mid LAD lesion is 20% stenosed.   Dist LM to Prox LAD lesion is 20% stenosed.  Mild non-obstructive CAD Severe aortic stenosis (mean gradient 52.8 mmHg, peak to peak gradient 58 mmHg, AVA 0.90 cm2).  Recommendations: Will continue workup for TAVR  Findings Coronary Findings Diagnostic  Dominance: Right  Left Main Dist LM to Prox LAD lesion is 20% stenosed.  Left Anterior Descending Vessel is large. Mid LAD lesion is 20% stenosed.  Ramus Intermedius Ramus lesion is 30% stenosed.  Right Coronary Artery Vessel is large. Prox RCA lesion is 20% stenosed. Mid RCA to Dist RCA lesion is 20% stenosed.  Intervention  No interventions have been documented.   CARDIAC CATHETERIZATION  CARDIAC CATHETERIZATION 05/21/2015  Conclusion Images from the original result were not included. 1. Ost Ramus lesion, 60% stenosed. 2. Ost RCA lesion, 30% stenosed. 3. Dist LAD  lesion, tapers to a small vessel with diffuse roughly 40-50% stenosis. 4. The left ventricular systolic function is normal. 5. Elevated LVEDP  No culprit lesion noted to explain the patient's elevated troponin. The most significant lesion in the ostial ramus intermedius. This is not PCI target  She has elevated LVEDP, but relatively preserved EF.  Plan:  Return to TCU for monitoring and continued care.  TR band removal per protocol.  Defer further cardiac evaluation and management to Dr. Rennis Golden.    [name], M.D., M.S. Interventional Cardiologist  Pager # [phone] Phone # [phone] [address] [place], [place]  [address]  Findings Coronary Findings Diagnostic  Dominance: Right  Left Main . Vessel is large.  Left Anterior Descending . Vessel is large. Tapers to relatively small caliber vessel distally with diffuse apical disease Diffuse. Vessel is a relatively small at this level with diffuse disease  First Diagonal Branch The vessel is moderate in size and is angiographically normal.  Lateral First Diagonal Branch The vessel is small in size.  First Septal Branch The vessel is small in size.  Second Septal Branch The vessel is small in size.  Third Septal Branch The vessel is small in size.  Ramus Intermedius . Vessel is moderate in size. Discrete located at the major branch.  Lateral Ramus Intermedius The vessel is small in size.  Left Circumflex . Vessel is angiographically normal.  First Obtuse Marginal Branch The vessel is angiographically normal.  Lateral First Obtuse Marginal Branch The vessel is small in size.  Right Coronary Artery . Vessel is large. Discrete.  Acute Marginal Branch The vessel is moderate in size.  First Right Posterolateral Branch The vessel is small in size.  Third Right Posterolateral Branch The vessel is small in size.  Intervention  No interventions have been documented.     ECHOCARDIOGRAM  ECHOCARDIOGRAM COMPLETE 05/15/2023  Narrative ECHOCARDIOGRAM REPORT    Patient Name:   KIESHA ENSEY Date of Exam: 05/15/2023 Medical Rec #:  [id number]                Height:       61.0 in Accession #:    [id number]               Weight:       194.9 lb Date of Birth:  1940-10-21                BSA:          1.868 m Patient Age:    83 years                 BP:           155/126 mmHg Patient Gender: F                        HR:           61 bpm. Exam Location:  Inpatient  Procedure: 2D Echo, Cardiac Doppler and Color Doppler (Both Spectral and Color Flow Doppler were utilized during procedure).  Indications:    I50.40*  Unspecified combined systolic (congestive) and diastolic (congestive) heart failure  History:        Patient has prior history of Echocardiogram examinations, most recent 11/13/2022. CHF, CAD, Stroke, Aortic Valve Disease; Risk Factors:Hypertension, Dyslipidemia and Diabetes. TAVR procedure. Aortic Valve: 26 mm Medtronic stented (TAVR) valve is present in the aortic position. Procedure Date: 10/26/2020.  Sonographer:    Inetta Fermo  [facility] RDCS Referring Phys: [id number] RONDELL A SMITH   Sonographer Comments: Study delayed, patient in chair IMPRESSIONS   1. Left ventricular ejection fraction, by estimation, is 60 to 65%. The left ventricle has normal function. The left ventricle has no regional wall motion abnormalities. There is severe asymmetric left ventricular hypertrophy of the septal segment. Left ventricular diastolic function could not be evaluated. 2. Right ventricular systolic function is normal. The right ventricular size is mildly enlarged. There is moderately elevated pulmonary artery systolic pressure. The estimated right ventricular systolic pressure is 53.7 mmHg. 3. Left atrial size was mildly dilated. 4. Right atrial size was mildly dilated. 5. The mitral valve is degenerative. Trivial mitral valve regurgitation. Mild mitral stenosis. The mean mitral valve gradient is 3.0 mmHg. Severe mitral annular calcification. 6. Tricuspid valve regurgitation is mild to moderate. 7. Mild paravalvular leak adjacent to the intervalvular fibrosa. The aortic valve has been repaired/replaced. Aortic valve regurgitation is mild. No aortic stenosis is present. There is a 26 mm Medtronic stented (TAVR) valve present in the aortic position. Procedure Date: 10/26/2020. Echo findings are consistent with normal structure and function of the aortic valve prosthesis. Echo findings are consistent with perivalvular leak of the aortic prosthesis. Aortic regurgitation PHT measures 514 msec. Aortic valve mean gradient  measures 9.0 mmHg. Aortic valve Vmax measures 1.88 m/s. 8. The inferior vena cava is dilated in size with <50% respiratory variability, suggesting right atrial pressure of 15 mmHg.  Comparison(s): Prior images reviewed side by side. PVL is more prominent, stable RVSP.  FINDINGS Left Ventricle: Left ventricular ejection fraction, by estimation, is 60 to 65%. The left ventricle has normal function. The left ventricle has no regional wall motion abnormalities. Strain was performed and the global longitudinal strain is indeterminate. The left ventricular internal cavity size was normal in size. There is severe asymmetric left ventricular hypertrophy of the septal segment. Left ventricular diastolic function could not be evaluated due to mitral annular calcification (moderate or greater). Left ventricular diastolic function could not be evaluated.  Right Ventricle: The right ventricular size is mildly enlarged. No increase in right ventricular wall thickness. Right ventricular systolic function is normal. There is moderately elevated pulmonary artery systolic pressure. The tricuspid regurgitant velocity is 3.11 m/s, and with an assumed right atrial pressure of 15 mmHg, the estimated right ventricular systolic pressure is 53.7 mmHg.  Left Atrium: Left atrial size was mildly dilated.  Right Atrium: Right atrial size was mildly dilated.  Pericardium: There is no evidence of pericardial effusion.  Mitral Valve: The mitral valve is degenerative in appearance. Severe mitral annular calcification. Trivial mitral valve regurgitation. Mild mitral valve stenosis. MV peak gradient, 11.8 mmHg. The mean mitral valve gradient is 3.0 mmHg with average heart rate of 59 bpm.  Tricuspid Valve: The tricuspid valve is normal in structure. Tricuspid valve regurgitation is mild to moderate. No evidence of tricuspid stenosis.  Aortic Valve: Mild paravalvular leak adjacent to the intervalvular fibrosa. The aortic valve  has been repaired/replaced. Aortic valve regurgitation is mild. Aortic regurgitation PHT measures 514 msec. No aortic stenosis is present. Aortic valve mean gradient measures 9.0 mmHg. Aortic valve peak gradient measures 14.1 mmHg. Aortic valve area, by VTI measures 1.92 cm. There is a 26 mm Medtronic stented (TAVR) valve present in the aortic position. Procedure Date: 10/26/2020. Echo findings are consistent with normal structure and function of the aortic valve prosthesis.  Pulmonic Valve: The pulmonic valve was normal in structure. Pulmonic valve regurgitation is not visualized. No  evidence of pulmonic stenosis.  Aorta: The aortic root and ascending aorta are structurally normal, with no evidence of dilitation.  Venous: The inferior vena cava is dilated in size with less than 50% respiratory variability, suggesting right atrial pressure of 15 mmHg.  IAS/Shunts: The atrial septum is grossly normal.  Additional Comments: 3D was performed not requiring image post processing on an independent workstation and was indeterminate.   LEFT VENTRICLE PLAX 2D LVIDd:         4.60 cm     Diastology LVIDs:         2.90 cm     LV e' medial:    5.55 cm/s LV PW:         1.10 cm     LV E/e' medial:  25.9 LV IVS:        1.50 cm     LV e' lateral:   6.64 cm/s LVOT diam:     2.00 cm     LV E/e' lateral: 21.7 LV SV:         81 LV SV Index:   43 LVOT Area:     3.14 cm  LV Volumes (MOD) LV vol d, MOD A2C: 84.9 ml LV vol d, MOD A4C: 68.3 ml LV vol s, MOD A2C: 27.4 ml LV vol s, MOD A4C: 31.5 ml LV SV MOD A2C:     57.5 ml LV SV MOD A4C:     68.3 ml LV SV MOD BP:      46.8 ml  RIGHT VENTRICLE             IVC RV S prime:     10.90 cm/s  IVC diam: 2.70 cm TAPSE (M-mode): 2.2 cm  LEFT ATRIUM             Index        RIGHT ATRIUM           Index LA diam:        5.20 cm 2.78 cm/m   RA Area:     21.80 cm LA Vol (A2C):   54.6 ml 29.23 ml/m  RA Volume:   64.90 ml  34.74 ml/m LA Vol (A4C):   62.4 ml  33.40 ml/m LA Biplane Vol: 59.4 ml 31.80 ml/m AORTIC VALVE AV Area (Vmax):    1.93 cm AV Area (Vmean):   1.83 cm AV Area (VTI):     1.92 cm AV Vmax:           187.67 cm/s AV Vmean:          122.000 cm/s AV VTI:            0.420 m AV Peak Grad:      14.1 mmHg AV Mean Grad:      9.0 mmHg LVOT Vmax:         115.00 cm/s LVOT Vmean:        71.000 cm/s LVOT VTI:          0.257 m LVOT/AV VTI ratio: 0.61 AI PHT:            514 msec  AORTA Ao Root diam: 3.30 cm Ao Asc diam:  3.25 cm  MITRAL VALVE                TRICUSPID VALVE MV Area (PHT): 4.60 cm     TR Peak grad:   38.7 mmHg MV Area VTI:   1.66 cm     TR Vmax:  311.00 cm/s MV Peak grad:  11.8 mmHg MV Mean grad:  3.0 mmHg     SHUNTS MV Vmax:       1.72 m/s     Systemic VTI:  0.26 m MV Vmean:      74.7 cm/s    Systemic Diam: 2.00 cm MV Decel Time: 165 msec MV E velocity: 144.00 cm/s MV A velocity: 71.80 cm/s MV E/A ratio:  2.01  Riley Lam MD Electronically signed by Riley Lam MD Signature Date/Time: 05/15/2023/5:44:08 PM    Final    MONITORS  LONG TERM MONITOR-LIVE TELEMETRY (3-14 DAYS) 11/19/2020  Narrative  Predominantly sinus rhythm with minimum heart rate of 49 bpm, maximum 114 bpm. Average 68 bpm.  Total of 206 (Nonsustained Ventricular Tachycardia-V. tach fastest 5 beats continue 39 bpm. Longest 9.3 seconds average rate 1 3 5  bpm.  A. fib burden (16%) rate range 62-139 bpm with average rate 105 bpm. Longest was for 17 hr 55 min. Some aberrant conduction suspected.  Rare isolated PACs and PVCs. Some PVC couplets and triplets were noted.  No high-grade AV block noted.  Bursts of V. tach could be A. fib with aberrancy.   Patch Wear Time:  12 days and 19 hours (2022-09-22T09:52:09-398 to 2022-10-05T05:32:17-0400)  Abnormal monitor.  Still shows a significant mount of atrial fibrillation and short burst of nonsustained V. tach.  Thankfully, no high-grade AV block.  I think  we can continue amiodarone and probably titrate up beta-blocker.   Bryan Lemma, MD   CT SCANS  CT CORONARY MORPH W/CTA COR W/SCORE 09/28/2020  Addendum 09/29/2020  1:17 PM ADDENDUM REPORT: 09/29/2020 13:14  CLINICAL DATA:  Pre-op transcatheter aortic valve replacement (TAVR)  EXAM: Cardiac TAVR CT  TECHNIQUE: The patient was scanned on a Siemens Force 192 slice scanner. A 120 kV retrospective scan was triggered in the descending thoracic aorta at 111 HU's. Gantry rotation speed was 270 msecs and collimation was .9 mm. The 3D data set was reconstructed in 5% intervals of the R-R cycle. Systolic and diastolic phases were analyzed on a dedicated work station using MPR, MIP and VRT modes. The patient received 95mL OMNIPAQUE IOHEXOL 350 MG/ML SOLN of contrast.  FINDINGS: Aortic Valve: Tricuspid aortic valve. Severely reduced cusp separation. Severely thickened, moderately calcified aortic valve cusps.  AV calcium score: 1049  Virtual Basal Annulus Measurements:  Maximum/Minimum Diameter: 23.1 mm x 18.9 mm  Perimeter: 65.4 mm  Area: 328 mm2  No significant LVOT calcifications.  Based on these measurements, the annulus would be suitable for a 20 mm Edwards Sapien 3 valve vs 23 mm Medtronic Evolut Pro valve. Sinus dimensions are borderline for 26 mm Medtronic Evolut Pro valve. Recommend Heart Team discussion for valve sizing.  Sinus of Valsalva Measurements:  Non-coronary:  26 mm  Right - coronary:  26 mm  Left - coronary:  27 mm  Sinus of Valsalva Height:  Left: 17.5 mm  Right: 18.6 mm  Aorta: Mild aortic atherosclerosis.  Sinotubular Junction: 27 mm  Ascending Thoracic Aorta:  31 mm  Aortic Arch:  24 mm  Descending Thoracic Aorta:  22 mm  Coronary Artery Height above Annulus:  Left Main: 13 mm  Right Coronary: 11.6 mm  Coronary Arteries: Normal origins. 3 vessel coronary artery calcifications.  Optimum Fluoroscopic Angle for Delivery: RAO 1,  CRA 1  Moderate mitral annular calcification.  No left atrial appendage thrombus.  IMPRESSION: 1. Tricuspid aortic valve. Severely reduced cusp separation. Severely thickened, moderately calcified aortic valve cusps.  2.  AV calcium score: 1049  3. Annulus area: 328 mm2, no significant LVOT calcifications. Based on these measurements, the annulus would be suitable for a 20 mm Edwards Sapien 3 valve vs 23 mm Medtronic Evolut Pro valve. Sinus dimensions are borderline for 26 mm Medtronic Evolut Pro valve. Recommend Heart Team discussion for valve sizing.  4.  Sufficient coronary artery height from annulus.  5.  Optimum Fluoroscopic Angle for Delivery: RAO 1, CRA 1   Electronically Signed By: Grady Lawman M.D. On: 09/29/2020 13:14  Narrative EXAM: OVER-READ INTERPRETATION  CT CHEST  The following report is an over-read performed by radiologist Dr. Alexandria Angel of [facility] on 09/29/2020. This over-read does not include interpretation of cardiac or coronary anatomy or pathology. The coronary calcium score/coronary CTA interpretation by the cardiologist is attached.  COMPARISON:  Cardiac CTA 10/09/2019.  FINDINGS: Extracardiac findings will be described separately under dictation for contemporaneously obtained CTA chest, abdomen and pelvis.  IMPRESSION: Please see separate dictation for contemporaneously obtained CTA chest, abdomen and pelvis dated 09/28/2020 for full description of relevant extracardiac findings.  Electronically Signed: By: Alexandria Angel M.D. On: 09/29/2020 06:07   CT SCANS  CT CORONARY FRACTIONAL FLOW RESERVE DATA PREP 10/09/2019  Narrative EXAM: CT FFR ANALYSIS  CLINICAL DATA:  Chest pain/anginal equiv, 26yr CHD risk 10-20%, not treadmill candidate  FINDINGS: FFRct analysis was performed on the original cardiac CT angiogram dataset. Diagrammatic representation of the FFRct analysis is provided in a separate PDF  document in PACS. This dictation was created using the PDF document and an interactive 3D model of the results. 3D model is not available in the EMR/PACS. Normal FFR range is >0.80.  1. Left Main:  No significant stenosis. FFR = 0.99  2. LAD: No significant stenosis. Proximal FFR = 0.97, Mid FFR = 0.90, Distal FFR = 0.85 3. LCX: No significant stenosis. Proximal FFR = 0.98, Distal FFR = 0.91 4. RCA: No significant stenosis. Proximal FFR = 0.96, Mid FFR = 0.92, Distal FFR = 0.91  IMPRESSION: 1.  CT FFR analysis did not show any significant stenosis.   Electronically Signed By: Hazle Lites M.D. On: 10/10/2019 23:18   CT CORONARY MORPH W/CTA COR W/SCORE 10/09/2019  Addendum 10/09/2019  9:12 PM ADDENDUM REPORT: 10/09/2019 21:10  HISTORY: 83 yo female with chest pain/anginal equiv, 52yr CHD risk 10-20%, not treadmill candidate  EXAM: Cardiac/Coronary CTA  TECHNIQUE: The patient was scanned on a [employer].  PROTOCOL: A 120 kV prospective scan was triggered in the descending thoracic aorta at 111 HU's. Axial non-contrast 3 mm slices were carried out through the heart. The data set was analyzed on a dedicated work station and scored using the Agatson method. Gantry rotation speed was 250 msecs and collimation was .6 mm. Beta blockade and 0.8 mg of sl NTG was given. The 3D data set was reconstructed in 5% intervals of the 67-82 % of the R-R cycle. Diastolic phases were analyzed on a dedicated work station using MPR, MIP and VRT modes. The patient received 80mL OMNIPAQUE IOHEXOL 350 MG/ML SOLN of contrast.  FINDINGS: Quality: Fair, HR 59  Coronary calcium score: The patient's coronary artery calcium score is 624, which places the patient in the 82nd percentile.  Coronary arteries: Normal coronary origins.  Right dominance.  Right Coronary Artery: Dominant. Gives off large R-PDA and R-PLB branches. Mild 25-49% proximal mixed stenosis (CADRADS 2).  No significant distal or branch disease.  Left Main Coronary Artery: Normal  left main. Bifurcates into the LAD and LCx branches.  Left Anterior Descending Coronary Artery: Moderate sized anterior artery which gives off a mid-vessel diagonal branch. There is diffuse calcified plaque with mild 25-49% proximal stenosis (CADRADS3) and minimal mixed mid to distal vessel stenosis (<25% - CADRADS1).  Ramus intermedius: Small branch <2.0 mm, mild to moderate ostial disease  Left Circumflex Artery: AV groove vessel. There is a moderate mixed 50-69% proximal stenosis (CADRADS3). Small distal OM branch without disease.  Aorta: Normal size, 30 mm at the mid ascending aorta (level of the PA bifurcation) measured double oblique. Aortic atherosclerosis. No dissection.  Aortic Valve: Trileaflet with leaflet and annular calcification. Probable aortic stenosis.  Other findings:  Normal pulmonary vein drainage into the left atrium.  Normal left atrial appendage without a thrombus.  Dilated main pulmonary artery to 28 mm, suggestive of pulmonary hypertension.  Mitral annular calcification  IMPRESSION: 1. Diffuse multivessel mixed CAD, possibly significant in the proximal LCx, CADRADS = 3. CT FFR will be performed and reported separately.  2. Coronary calcium score of 624. This was 82nd percentile for age and sex matched control.  3. Normal coronary origin with right dominance.  4. Dilated main pulmonary artery to 28 mm, suggestive of pulmonary hypertension.  5. Mitral annular calcification  6. Aortic annular and leaflet calcification, probable aortic stenosis   Electronically Signed By: Chrystie Nose M.D. On: 10/09/2019 21:10  Narrative EXAM: OVER-READ INTERPRETATION  CT CHEST  The following report is an over-read performed by radiologist Dr. Trudie Reed of [facility] on 10/09/2019. This over-read does not include interpretation of cardiac or  coronary anatomy or pathology. The coronary calcium score/coronary CTA interpretation by the cardiologist is attached.  COMPARISON:  None.  FINDINGS: Aortic atherosclerosis. Within the visualized portions of the thorax there are no suspicious appearing pulmonary nodules or masses, there is no acute consolidative airspace disease, no pleural effusions, no pneumothorax and no lymphadenopathy. Visualized portions of the upper abdomen are unremarkable. There are no aggressive appearing lytic or blastic lesions noted in the visualized portions of the skeleton.  IMPRESSION: 1.  Aortic Atherosclerosis (ICD10-I70.0).  Electronically Signed: By: Trudie Reed M.D. On: 10/09/2019 15:23     ______________________________________________________________________________________________         Assessment & Plan   Acute on Chronic HF Preserved EF - on no MRA due to CKD stage 4 - no SGLT2i due to UTIs - complicated by hypo-albuminemia - increased to 100 mg IB BID  Aspiration PNA and COPD - as per primary  HTN - increasing diuretics  CAD - non obstructive, asymptomatic - no statin for HLD and risk factor modification  Persistent AF - complicated by iron deficiency anemia  - continue low dose BB and amiodarone  GOC - DNR limited; we discuss overall prognosis with her (no significant improvement) and opened up GOC discussion with her son, Reita Cliche (if we are unable to improve her to baseline, we should consider focusing on Quality of Life)    For questions or updates, please contact [facility] Please consult [email or web address] for contact info under Cardiology/STEMI.      Riley Lam, MD FASE [facility] Cardiologist [facility]  [address] [place], [address] [place], [place] [address] [phone]  10:36 AM

## 2023-05-23 NOTE — Progress Notes (Signed)
 Heart Failure Stewardship Pharmacist Progress Note   PCP: Corwin Levins, MD PCP-Cardiologist: Bryan Lemma, MD    HPI:  83 yo F with PMH of HTN, HLD, T2DM, CKD3, CAD, paroxysmal afib, recurrent GI bleed, severe aortic stenosis s/p TAVR complicated with transient heart block, respiratory failure on 2L [place] at BL, hypothyroidism, GERD   Patient previously admitted 02/2023 for CHF exacerbation. Patient was diuresed during admission and home lasix was increased from prn to daily.   Presented to the ED on 4/7 with worsening SOB with exertion and leg swelling over the last 2 weeks. Also presented with productive cough, chest pain/tightness, NVD, and wheezing. Reports PCP instructed her to take an additional dose of 20mg  lasix in the setting of weight gain of 3 lb - no improvement per pt. Endorses right flank pain 2/2 coughing.   In the ED, patient was hypertensive and hypoxic (did not present on her home O2) wheezing, rhonchi, rales and BL LE edema present on exam. Slight murmur present. JVD Patient diuresed with lasix 40mg  x2 in the ED. CXR showed mild to moderate pulmonary vascular congestion. No dense consolidation or lung collapse. ECHO 4/8 showed LVEF 60-65% (unchanged from prior 11/2022) with normal LV function. Severe asymmetric LV hypertrophy of septal segment. LV diastolic function could not be evaluated. Normal RV function. Mildly enlarged LV. MV is degenerative with trivial regurgitation. Mild stenosis and severe annular calcifications also present. Mild to moderate tricuspid valve regurgitation. Cardiology was consulted, planning with diuresis for now.   Patient denies SOB, congestion when laying down. Mentions that she feels better than yesterday and that her cough is improving but remains productive.  Patient oxygen status has declined back to 10L HFNC. Trace BL leg swelling on exam.  Current HF Medications: Diuretic: IV Lasix 80mg  BID  Beta Blocker: Metoprolol succinate 12.5mg  daily   Other: isosorbide mononitrate 60mg  daily, hydralazine 75mg  q8h  Prior to admission HF Medications: Diuretic: Lasix 60mg  daily Beta blocker: Metoprolol succinate 12.5mg  daily  Other: Isosorbide mononitrate 15mg  daily, hydralazine 25mg  q8h  Pertinent Lab Values: Serum creatinine 1.74>1.49>1.5>1.55 (BL 1.3-1.5?), BUN 39>34>29>27, Potassium 4.2>3.6>4>3.8, Sodium 134, BNP 977.8, Magnesium 1.4>2>2, A1c 6.5   Vital Signs: Weight: (bed) 191> (bed) 183 lbs (admission weight: 191 lbs) Blood pressure: 120-160s/30-60s  Heart rate: 60-80s  I/O: net - yesterday; net -8.62L since admission 3L [place] > 10L HFNC > 8L [place] >10 HFNC  Medication Assistance / Insurance Benefits Check: Does the patient have prescription insurance?  Yes Type of insurance plan: Medicare  Outpatient Pharmacy:  Prior to admission outpatient pharmacy: [facility]  Is the patient willing to use [facility] TOC pharmacy at discharge? Yes Is the patient willing to transition their outpatient pharmacy to utilize a [facility] outpatient pharmacy? No    Assessment: 1. Acute on chronic diastolic CHF (LVEF 60-65%), due to unknown etiology . NYHA class 2-3 symptoms.Strict I/Os and daily weights. Keep K>4 and Mg>2. Scr reaching baseline.Overall oxygen status not at baseline - due to aspiration PNA or volume overload. Leg swelling improved on exam. Cardiology considering torsemide 40mg  BID upon discharge - Continue hydralazine 75mg  q8h  - Continue IV lasix 80mg  BID, with plans to transition to PO upon discharge - Agree with deferring SGLT2i given hx of 3 UTI in prior 1-2 months  - Defer titrating Metoprolol succinate to 25mg  daily for BP control until euvolemic  - Consider ACE/ARB/ARNI or MRA until renal function stabilizes, confirmed fluid status   Plan: 1) Medication changes recommended at this  time: - Monitor O2 status  - Monitor renal function and BP trend  2) Patient assistance: - Contacted son and he is interested in utilizing  [facility] for medication changes upon discharge  but [facility] and remaining prescriptions  - Son reported that current medications are affordable  3)  Education  - Patient has been educated on current HF medications and potential additions to HF medication regimen - Patient verbalizes understanding that over the next few months, these medication doses may change and more medications may be added to optimize HF regimen - Patient has been educated on basic disease state pathophysiology and goals of therapy   Harvest Lineman, PharmD PGY1 Pharmacy Resident

## 2023-05-24 ENCOUNTER — Inpatient Hospital Stay (HOSPITAL_COMMUNITY)

## 2023-05-24 DIAGNOSIS — I480 Paroxysmal atrial fibrillation: Secondary | ICD-10-CM | POA: Diagnosis not present

## 2023-05-24 DIAGNOSIS — I160 Hypertensive urgency: Secondary | ICD-10-CM | POA: Diagnosis not present

## 2023-05-24 DIAGNOSIS — I5033 Acute on chronic diastolic (congestive) heart failure: Secondary | ICD-10-CM | POA: Diagnosis not present

## 2023-05-24 DIAGNOSIS — I2510 Atherosclerotic heart disease of native coronary artery without angina pectoris: Secondary | ICD-10-CM | POA: Diagnosis not present

## 2023-05-24 LAB — BASIC METABOLIC PANEL WITH GFR
Anion gap: 11 (ref 5–15)
BUN: 27 mg/dL — ABNORMAL HIGH (ref 8–23)
CO2: 32 mmol/L (ref 22–32)
Calcium: 8.8 mg/dL — ABNORMAL LOW (ref 8.9–10.3)
Chloride: 92 mmol/L — ABNORMAL LOW (ref 98–111)
Creatinine, Ser: 1.76 mg/dL — ABNORMAL HIGH (ref 0.44–1.00)
GFR, Estimated: 28 mL/min — ABNORMAL LOW (ref >60)
Glucose, Bld: 103 mg/dL — ABNORMAL HIGH (ref 70–99)
Potassium: 4 mmol/L (ref 3.5–5.1)
Sodium: 135 mmol/L (ref 135–145)

## 2023-05-24 LAB — GLUCOSE, CAPILLARY
Glucose-Capillary: 88 mg/dL (ref 70–99)
Glucose-Capillary: 127 mg/dL — ABNORMAL HIGH (ref 70–99)
Glucose-Capillary: 81 mg/dL (ref 70–99)
Glucose-Capillary: 84 mg/dL (ref 70–99)

## 2023-05-24 LAB — MAGNESIUM: Magnesium: 1.9 mg/dL (ref 1.7–2.4)

## 2023-05-24 MED ORDER — METHYLPREDNISOLONE SODIUM SUCC 125 MG IJ SOLR
60.0000 mg | Freq: Two times a day (BID) | INTRAMUSCULAR | Status: DC
  Administered 2023-05-24 – 2023-05-29 (×10): 60 mg via INTRAVENOUS
  Filled 2023-05-24 (×10): qty 2

## 2023-05-24 MED ORDER — TORSEMIDE 20 MG PO TABS
40.0000 mg | ORAL_TABLET | Freq: Two times a day (BID) | ORAL | Status: DC
  Administered 2023-05-24 – 2023-05-25 (×3): 40 mg via ORAL
  Filled 2023-05-24 (×3): qty 2

## 2023-05-24 NOTE — Progress Notes (Signed)
 Heart Failure Stewardship Pharmacist Progress Note   PCP: Corwin Levins, MD PCP-Cardiologist: Bryan Lemma, MD    HPI:  83 yo F with PMH of HTN, HLD, T2DM, CKD3, CAD, paroxysmal afib, recurrent GI bleed, severe aortic stenosis s/p TAVR complicated with transient heart block, respiratory failure on 2L [place] at BL, hypothyroidism, GERD   Patient previously admitted 02/2023 for CHF exacerbation. Patient was diuresed during admission and home lasix was increased from prn to daily.   Presented to the ED on 4/7 with worsening SOB with exertion and leg swelling over the last 2 weeks. Also presented with productive cough, chest pain/tightness, NVD, and wheezing. Reports PCP instructed her to take an additional dose of 20mg  lasix in the setting of weight gain of 3 lb - no improvement per pt. Endorses right flank pain 2/2 coughing.   In the ED, patient was hypertensive and hypoxic (did not present on her home O2) wheezing, rhonchi, rales and BL LE edema present on exam. Slight murmur present. JVD Patient diuresed with lasix 40mg  x2 in the ED. CXR showed mild to moderate pulmonary vascular congestion. No dense consolidation or lung collapse. ECHO 4/8 showed LVEF 60-65% (unchanged from prior 11/2022) with normal LV function. Severe asymmetric LV hypertrophy of septal segment. LV diastolic function could not be evaluated. Normal RV function. Mildly enlarged LV. MV is degenerative with trivial regurgitation. Mild stenosis and severe annular calcifications also present. Mild to moderate tricuspid valve regurgitation. Cardiology was consulted, planning with diuresis for now.  Patient notes that SOB and congestion is better but still has a cough. Endorses new headache from coughing a lot. Notes that leg swelling is better. No leg swelling on exam today. Patient aware that IV lasix will be changed to po medication after her discussion with cardiology.   Current HF Medications: Diuretic: IV Lasix 100mg  BID  Beta  Blocker: Metoprolol succinate 12.5mg  daily  Other: isosorbide mononitrate 60mg  daily, hydralazine 75mg  q8h  Prior to admission HF Medications: Diuretic: Lasix 60mg  daily Beta blocker: Metoprolol succinate 12.5mg  daily  Other: Isosorbide mononitrate 15mg  daily, hydralazine 25mg  q8h  Pertinent Lab Values: Serum creatinine 1.74>1.49>1.5>1.55>1.74 (BL 1.3-1.5?), BUN 29>27>27, Potassium 3.8>4, Sodium 135, BNP 977.8, Magnesium 1.4>2>2>1.9, A1c 6.5   Vital Signs: Weight: (bed) 191> (bed) 181 lbs (admission weight: 191 lbs) Blood pressure: 120-160s/30-60s  Heart rate: 60-80s  I/O: net +244mL yesterday; net -8.8L since admission 3L [place] > 10L HFNC > 8L [place] >10 HFNC >11HFNC  Medication Assistance / Insurance Benefits Check: Does the patient have prescription insurance?  Yes Type of insurance plan: Medicare  Outpatient Pharmacy:  Prior to admission [facility]: [facility]  Is the patient willing to use [facility] TOC pharmacy at discharge? Yes Is the patient willing to transition their outpatient pharmacy to utilize a [facility] outpatient pharmacy? No    Assessment: 1. Acute on chronic diastolic CHF (LVEF 60-65%), due to unknown etiology . NYHA class 2-3 symptoms.Strict I/Os and daily weights. Keep K>4 and Mg>2. Scr worsening.Overall oxygen status not at baseline - due to aspiration PNA or volume overload. Leg swelling improved on exam. Cardiology considering torsemide 40mg  BID upon discharge. Patient has PRN APAP on board. Patient would like to take something for her HA - will notify RN to administer a dose.   - Continue hydralazine 75mg  q8h  - Agree with deferring SGLT2i given hx of 3 UTI in prior 1-2 months  - Defer titrating Metoprolol succinate to 25mg  daily for BP control until euvolemic  -  Consider ACE/ARB/ARNI or MRA until renal function stabilizes, confirmed fluid status - Consider decreasing IV lasix back down to 80mg  BID vs transitioning to PO torsemide   Plan: 1) Medication  changes recommended at this time: - Monitor O2 status  - Monitor renal function and BP trend  2) Patient assistance: - Contacted son and he is interested in utilizing [facility] for medication changes upon discharge  but [facility] and remaining prescriptions  - Son reported that current medications are affordable  3)  Education  - Patient has been educated on current HF medications and potential additions to HF medication regimen - Patient verbalizes understanding that over the next few months, these medication doses may change and more medications may be added to optimize HF regimen - Patient has been educated on basic disease state pathophysiology and goals of therapy   Harvest Lineman, PharmD PGY1 Pharmacy Resident

## 2023-05-24 NOTE — Progress Notes (Signed)
 Occupational Therapy Treatment Patient Details Name: Blaike Newburn MRN: [id number] DOB: 20-Sep-1940 Today's Date: 05/24/2023   History of present illness Patient is a 83 year old female with acute on chronic respiratory failure with hypoxia secondary to acute on chronic diastolic CHF. Pt found to have aspiration PNA. History of HTN, hyperlipidemia, respiratory failure on 2 L, PAF, GI bleed, aortic stenosis s/p TAVR,  transient heart block, diabetes mellitus type II, CAD, hypothyroidism, GERD   OT comments  Patient needing more assist this session compared to prior sessions.  Mod A for lower body ADL, Min A for basic transfers with increasing SOB and cough.  HH continues to be recommended, but SNF may need to be considered if she continues to decline in the acute setting.  OT will continue efforts in the acute setting to address deficits.        If plan is discharge home, recommend the following:  Help with stairs or ramp for entrance;A little help with bathing/dressing/bathroom;A little help with walking and/or transfers;Assistance with cooking/housework;Assist for transportation   Equipment Recommendations  None recommended by OT    Recommendations for Other Services      Precautions / Restrictions Precautions Precautions: Fall Recall of Precautions/Restrictions: Intact Precaution/Restrictions Comments: monitor O2 Restrictions Weight Bearing Restrictions Per Provider Order: No       Mobility Bed Mobility Overal bed mobility: Needs Assistance Bed Mobility: Supine to Sit       Sit to supine: Min assist     Patient Response: Cooperative  Transfers Overall transfer level: Needs assistance Equipment used: Rolling walker (2 wheels) Transfers: Sit to/from Stand, Bed to chair/wheelchair/BSC Sit to Stand: Min assist     Step pivot transfers: Supervision, Contact guard assist           Balance Overall balance assessment: Needs assistance Sitting-balance  support: Feet supported Sitting balance-[name]: Fair     Standing balance support: Reliant on assistive device for balance Standing balance-[name]: Poor                             ADL either performed or assessed with clinical judgement   ADL       Grooming: Wash/dry hands;Wash/dry face;Set up;Sitting           Upper Body Dressing : Minimal assistance;Sitting   Lower Body Dressing: Moderate assistance;Sit to/from stand   Toilet Transfer: Minimal assistance;Ambulation;Rolling walker (2 wheels);BSC/3in1                  Extremity/Trunk Assessment Upper Extremity Assessment Upper Extremity Assessment: Generalized weakness       Cervical / Trunk Assessment Cervical / Trunk Assessment: Kyphotic    Vision Patient Visual Report: No change from baseline     Perception Perception Perception: Not tested   Praxis Praxis Praxis: Not tested   Communication Communication Communication: No apparent difficulties Factors Affecting Communication: Hearing impaired   Cognition Arousal: Alert Behavior During Therapy: WFL for tasks assessed/performed Cognition: No apparent impairments                               Following commands: Intact        Cueing   Cueing Techniques: Verbal cues  Exercises      Shoulder Instructions       General Comments      Pertinent Vitals/ Pain       Pain Assessment  Pain Assessment: No/denies pain Pain Intervention(s): Monitored during session                                                          Frequency  Min 2X/week        Progress Toward Goals  OT Goals(current goals can now be found in the care plan section)  Progress towards OT goals: Progressing toward goals  Acute Rehab OT Goals OT Goal Formulation: With patient Time For Goal Achievement: 05/29/23 Potential to Achieve Goals: Good  Plan      Co-evaluation                 AM-PAC OT  "6 Clicks" Daily Activity     Outcome Measure   Help from another person eating meals?: None Help from another person taking care of personal grooming?: A Little Help from another person toileting, which includes using toliet, bedpan, or urinal?: A Little Help from another person bathing (including washing, rinsing, drying)?: A Lot Help from another person to put on and taking off regular upper body clothing?: A Little Help from another person to put on and taking off regular lower body clothing?: A Lot 6 Click Score: 17    End of Session Equipment Utilized During Treatment: Rolling walker (2 wheels);Oxygen  OT Visit Diagnosis: Unsteadiness on feet (R26.81);Muscle weakness (generalized) (M62.81)   Activity Tolerance Patient tolerated treatment well   Patient Left in chair;with call bell/phone within reach   Nurse Communication Mobility status        Time: 4403-4742 OT Time Calculation (min): 22 min  Charges: OT General Charges $OT Visit: 1 Visit OT Treatments $Self Care/Home Management : 8-22 mins  05/24/2023  RP, OTR/L  Acute Rehabilitation Services  Office:  [phone]   Benjamen Brand 05/24/2023, 10:46 AM

## 2023-05-24 NOTE — Progress Notes (Signed)
 Progress Note   Patient: Mary Mccarthy ZOX:[id number] DOB: 1940-10-20 DOA: 05/14/2023     10 DOS: the patient was seen and examined on 05/24/2023   Brief hospital course: Mary Mccarthy was admitted to the hospital with the working diagnosis of heart failure exacerbation.  83 yo female with the past medical history of hypertension, hyperlipidemia, paroxysmal atrial fibrillation, aortic stenosis sp TAVR, T2DM and obesity who presented with dyspnea and lower extremity edema. Reported 2 weeks of worsening symptoms, along with weight gain. Her dyspnea and edema were persistent despite increasing the dose of her oral furosemide at home.  At the time of her initial physical examination her 02 saturation was 75% on room air, blood pressure 196/70, HR 62, RR 30.  Lungs with decreased breath sounds and rales bilaterally, up to mid zones, heart with S1 and S2 present and regular, abdomen with no distention and positive bilateral lower extremity, ++ pitting edema.   Na 135, K 4,1 Cl 100, bicarbonate 25 glucose 109, bun 24 cr 1,98  BNP 977 High sensitive troponin 35 and 31  Lactic acid 2,0 and 1,0  Wbc 5,2 hgb 8,1 plt 223  TSH 5,6  Sars covid 19 negative  Influenza negative  RSV negative  Urine analysis SG 1,014, protein 30, negative leukocytes and negative hgb   Chest radiograph with hypoinflation, positive cardiomegaly, bilateral hilar vascular congestion with cephalization of the vasculature.   EKG 62 bpm, right axis deviation, qtc 494, sinus rhythm with poor R R wave progression with no significant ST segment or T wave changes.   Patient was placed on aggressive diuresis.  04/13 developed aspiration pneumonia and was placed on antibiotic therapy.   Assessment and Plan: * Acute on chronic diastolic CHF (congestive heart failure) (HCC) Echocardiogram with preserved LV systolic function with EF 60 to 65%, severe LVH, RV systolic function preserved, RVSP 53.7 mmHg, LA and RA with mild  dilatation, mild mitral stenosis, mild to moderate tricuspid valve regurgitation, sp TAVR with mild paravalvular leak   Documented urine output 840 ml, since admission negative fluid balance - 8,807 ml.   Plan to continue diuresis with IV furosemide 100 mg IV bid Afterload reduction with hydralazine and isosorbide.  B blocker with metoprolol.  Limited pharmacologic therapy due to reduced GFR.   Acute on chronic hypoxemic respiratory failure due to acute cardiogenic pulmonary edema. 02 saturation is 99% on 11 L/min per high flow nasal cannula.  Chest radiograph today with bilateral interstitial infiltrates, with mild improvement on the right side.  Possible organizing pneumonia, possible amiodarone toxicity, will discuss with Cardiology for possible discontinue amiodarone Will start systemic corticosteroids.   Paroxysmal atrial fibrillation (HCC) Continue rhythm control with amiodarone and rate control with metoprolol.  Patient not on anticoagulation.   Coronary artery disease, non-occlusive Continue atorvastatin and blood pressure control   Hypertensive urgency Continue blood pressure control with metoprolol, hydralazine and isosorbide.   CKD (chronic kidney disease), stage IV (HCC) Hyponatremia.   Renal function with serum cr at 1,76 with K at 4,0 and serum bicarbonate at 32  Na 135 and Mg 1,9   Follow up renal function and electrolytes Continue diuresis with IV furosemide for volume overload.   Type 2 diabetes mellitus with hyperlipidemia (HCC) Continue insulin sliding scale for glucose cover and monitoring  Continue statin   Anxiety with depression Continue mirtazapine and alprazolam.   Hypothyroidism Continue with levothyroxine   Normocytic anemia Cell count has been stable.   Aspiration pneumonia (HCC) 04/13 radiograph with  multiple patchy bilateral infiltrates.  Patient afebrile and no leukocytosis  She has been on Unasyn, now transition to po Augmentin   Continue airway clearing techniques with flutter valve and incentive spirometer.        Subjective: Patient with persistent dyspnea and cough, continue to have high 02 requirements per high flow nasal cannula.   Physical Exam: Vitals:   05/24/23 0026 05/24/23 0505 05/24/23 0734 05/24/23 1153  BP: (!) 149/46 (!) 146/44 (!) 147/48 (!) 139/42  Pulse: 71 72 66 65  Resp: 20 20 20  (!) 24  Temp: 98 F (36.7 C) 98.6 F (37 C) 98.2 F (36.8 C) 97.6 F (36.4 C)  TempSrc: Oral Oral Oral Oral  SpO2: 95% 98% 99% 91%  Weight:  82.3 kg    Height:       Neurology awake and alert, deconditioned and ill looking appearing  ENT with mild pallor Cardiovascular with S1 and S2 present and regular with no gallops or rubs, positive systolic murmur at the right lower sternal border No JVD Lower extremity edema + pitting bilaterally Respiratory with bilateral rhonchi with no wheezing, diffuse bilateral inspiratory rales Abdomen with no distention   Data Reviewed:    Family Communication: no family at the bedside   Disposition: Status is: Inpatient Remains inpatient appropriate because: High 02 requirements   Planned Discharge Destination:  to be determined      Author: Albertus Alt, MD 05/24/2023 2:58 PM  For on call review [email or web address].

## 2023-05-24 NOTE — Care Management Important Message (Signed)
 Important Message  Patient Details  Name: Mary Mccarthy MRN: [id number] Date of Birth: 1941/01/19   Important Message Given:  Yes - Medicare IM     Janith Melnick 05/24/2023, 10:48 AM

## 2023-05-24 NOTE — Progress Notes (Signed)
 Progress Note  Patient Name: Mary Mccarthy Date of Encounter: 05/24/2023 Primary Cardiologist: Bryan Lemma, MD   Subjective   Crn has bumped aggressive diuresis. Symptoms have improved.  O2 Burden has not.  Vital Signs    Vitals:   05/23/23 1924 05/24/23 0026 05/24/23 0505 05/24/23 0734  BP: (!) 139/48 (!) 149/46 (!) 146/44 (!) 147/48  Pulse: 64 71 72 66  Resp: (P) 20 20 20 20   Temp: (P) 98.6 F (37 C) 98 F (36.7 C) 98.6 F (37 C) 98.2 F (36.8 C)  TempSrc: (P) Oral Oral Oral Oral  SpO2: 97% 95% 98% 99%  Weight:   82.3 kg   Height:        Intake/Output Summary (Last 24 hours) at 05/24/2023 1045 Last data filed at 05/24/2023 0508 Gross per 24 hour  Intake 536.24 ml  Output 840 ml  Net -303.76 ml   Filed Weights   05/23/23 0040 05/23/23 0446 05/24/23 0505  Weight: 82.6 kg 82.2 kg 82.3 kg    Physical Exam   GEN: Chronically ill appearing Cardiac: RRR, no rubs, or gallops. Systolic murmur Respiratory: Coarse breath sounds bilaterally GI: Soft, nontender, non-distended  MS: Non pitting edema  Labs   Telemetry: SR   Chemistry Recent Labs  Lab 05/22/23 0330 05/23/23 0237 05/24/23 0244  NA 135 134* 135  K 4.0 3.8 4.0  CL 93* 93* 92*  CO2 34* 35* 32  GLUCOSE 112* 110* 103*  BUN 29* 27* 27*  CREATININE 1.50* 1.55* 1.76*  CALCIUM 8.7* 8.6* 8.8*  ALBUMIN 2.0*  --   --   GFRNONAA 34* 33* 28*  ANIONGAP 8 6 11      Hematology Recent Labs  Lab 05/20/23 0305 05/21/23 0307 05/22/23 0330  WBC 8.3 7.1 8.7  RBC 3.01* 3.13* 2.91*  HGB 8.8* 9.1* 8.3*  HCT 27.1* 28.6* 26.4*  MCV 90.0 91.4 90.7  MCH 29.2 29.1 28.5  MCHC 32.5 31.8 31.4  RDW 16.0* 16.1* 16.0*  PLT 161 147* 167   BNP Recent Labs  Lab 05/20/23 0305  BNP 834.1*     Cardiac Studies   Cardiac Studies & Procedures   ______________________________________________________________________________________________ CARDIAC CATHETERIZATION  CARDIAC CATHETERIZATION  09/29/2020  Conclusion   Prox RCA lesion is 20% stenosed.   Mid RCA to Dist RCA lesion is 20% stenosed.   Ramus lesion is 30% stenosed.   Mid LAD lesion is 20% stenosed.   Dist LM to Prox LAD lesion is 20% stenosed.  Mild non-obstructive CAD Severe aortic stenosis (mean gradient 52.8 mmHg, peak to peak gradient 58 mmHg, AVA 0.90 cm2).  Recommendations: Will continue workup for TAVR  Findings Coronary Findings Diagnostic  Dominance: Right  Left Main Dist LM to Prox LAD lesion is 20% stenosed.  Left Anterior Descending Vessel is large. Mid LAD lesion is 20% stenosed.  Ramus Intermedius Ramus lesion is 30% stenosed.  Right Coronary Artery Vessel is large. Prox RCA lesion is 20% stenosed. Mid RCA to Dist RCA lesion is 20% stenosed.  Intervention  No interventions have been documented.   CARDIAC CATHETERIZATION  CARDIAC CATHETERIZATION 05/21/2015  Conclusion Images from the original result were not included. 1. Ost Ramus lesion, 60% stenosed. 2. Ost RCA lesion, 30% stenosed. 3. Dist LAD lesion, tapers to a small vessel with diffuse roughly 40-50% stenosis. 4. The left ventricular systolic function is normal. 5. Elevated LVEDP  No culprit lesion noted to explain the patient's elevated troponin. The most significant lesion in the ostial ramus intermedius. This  is not PCI target  She has elevated LVEDP, but relatively preserved EF.  Plan:  Return to TCU for monitoring and continued care.  TR band removal per protocol.  Defer further cardiac evaluation and management to Dr. Maximo Spar.    Arleen Lacer, M.D., M.S. Interventional Cardiologist  Pager # [phone] Phone # [phone] [address] [place], [place] [address]  Findings Coronary Findings Diagnostic  Dominance: Right  Left Main . Vessel is large.  Left Anterior Descending . Vessel is large. Tapers to relatively small caliber vessel distally with diffuse apical disease Diffuse.  Vessel is a relatively small at this level with diffuse disease  First Diagonal Branch The vessel is moderate in size and is angiographically normal.  Lateral First Diagonal Branch The vessel is small in size.  First Septal Branch The vessel is small in size.  Second Septal Branch The vessel is small in size.  Third Septal Branch The vessel is small in size.  Ramus Intermedius . Vessel is moderate in size. Discrete located at the major branch.  Lateral Ramus Intermedius The vessel is small in size.  Left Circumflex . Vessel is angiographically normal.  First Obtuse Marginal Branch The vessel is angiographically normal.  Lateral First Obtuse Marginal Branch The vessel is small in size.  Right Coronary Artery . Vessel is large. Discrete.  Acute Marginal Branch The vessel is moderate in size.  First Right Posterolateral Branch The vessel is small in size.  Third Right Posterolateral Branch The vessel is small in size.  Intervention  No interventions have been documented.     ECHOCARDIOGRAM  ECHOCARDIOGRAM COMPLETE 05/15/2023  Narrative ECHOCARDIOGRAM REPORT    Patient Name:   Mary Mccarthy Date of Exam: 05/15/2023 Medical Rec #:  [id number]                Height:       61.0 in Accession #:    [id number]               Weight:       194.9 lb Date of Birth:  December 14, 1940                BSA:          1.868 m Patient Age:    82 years                 BP:           155/126 mmHg Patient Gender: F                        HR:           61 bpm. Exam Location:  Inpatient  Procedure: 2D Echo, Cardiac Doppler and Color Doppler (Both Spectral and Color Flow Doppler were utilized during procedure).  Indications:    I50.40* Unspecified combined systolic (congestive) and diastolic (congestive) heart failure  History:        Patient has prior history of Echocardiogram examinations, most recent 11/13/2022. CHF, CAD, Stroke, Aortic Valve Disease;  Risk Factors:Hypertension, Dyslipidemia and Diabetes. TAVR procedure. Aortic Valve: 26 mm Medtronic stented (TAVR) valve is present in the aortic position. Procedure Date: 10/26/2020.  Sonographer:    Raynelle Callow RDCS Referring Phys: [id number] RONDELL A SMITH   Sonographer Comments: Study delayed, patient in chair IMPRESSIONS   1. Left ventricular ejection fraction, by estimation, is 60 to 65%. The left ventricle has normal function. The left ventricle has no regional  wall motion abnormalities. There is severe asymmetric left ventricular hypertrophy of the septal segment. Left ventricular diastolic function could not be evaluated. 2. Right ventricular systolic function is normal. The right ventricular size is mildly enlarged. There is moderately elevated pulmonary artery systolic pressure. The estimated right ventricular systolic pressure is 53.7 mmHg. 3. Left atrial size was mildly dilated. 4. Right atrial size was mildly dilated. 5. The mitral valve is degenerative. Trivial mitral valve regurgitation. Mild mitral stenosis. The mean mitral valve gradient is 3.0 mmHg. Severe mitral annular calcification. 6. Tricuspid valve regurgitation is mild to moderate. 7. Mild paravalvular leak adjacent to the intervalvular fibrosa. The aortic valve has been repaired/replaced. Aortic valve regurgitation is mild. No aortic stenosis is present. There is a 26 mm Medtronic stented (TAVR) valve present in the aortic position. Procedure Date: 10/26/2020. Echo findings are consistent with normal structure and function of the aortic valve prosthesis. Echo findings are consistent with perivalvular leak of the aortic prosthesis. Aortic regurgitation PHT measures 514 msec. Aortic valve mean gradient measures 9.0 mmHg. Aortic valve Vmax measures 1.88 m/s. 8. The inferior vena cava is dilated in size with <50% respiratory variability, suggesting right atrial pressure of 15 mmHg.  Comparison(s): Prior images reviewed side  by side. PVL is more prominent, stable RVSP.  FINDINGS Left Ventricle: Left ventricular ejection fraction, by estimation, is 60 to 65%. The left ventricle has normal function. The left ventricle has no regional wall motion abnormalities. Strain was performed and the global longitudinal strain is indeterminate. The left ventricular internal cavity size was normal in size. There is severe asymmetric left ventricular hypertrophy of the septal segment. Left ventricular diastolic function could not be evaluated due to mitral annular calcification (moderate or greater). Left ventricular diastolic function could not be evaluated.  Right Ventricle: The right ventricular size is mildly enlarged. No increase in right ventricular wall thickness. Right ventricular systolic function is normal. There is moderately elevated pulmonary artery systolic pressure. The tricuspid regurgitant velocity is 3.11 m/s, and with an assumed right atrial pressure of 15 mmHg, the estimated right ventricular systolic pressure is 53.7 mmHg.  Left Atrium: Left atrial size was mildly dilated.  Right Atrium: Right atrial size was mildly dilated.  Pericardium: There is no evidence of pericardial effusion.  Mitral Valve: The mitral valve is degenerative in appearance. Severe mitral annular calcification. Trivial mitral valve regurgitation. Mild mitral valve stenosis. MV peak gradient, 11.8 mmHg. The mean mitral valve gradient is 3.0 mmHg with average heart rate of 59 bpm.  Tricuspid Valve: The tricuspid valve is normal in structure. Tricuspid valve regurgitation is mild to moderate. No evidence of tricuspid stenosis.  Aortic Valve: Mild paravalvular leak adjacent to the intervalvular fibrosa. The aortic valve has been repaired/replaced. Aortic valve regurgitation is mild. Aortic regurgitation PHT measures 514 msec. No aortic stenosis is present. Aortic valve mean gradient measures 9.0 mmHg. Aortic valve peak gradient measures 14.1  mmHg. Aortic valve area, by VTI measures 1.92 cm. There is a 26 mm Medtronic stented (TAVR) valve present in the aortic position. Procedure Date: 10/26/2020. Echo findings are consistent with normal structure and function of the aortic valve prosthesis.  Pulmonic Valve: The pulmonic valve was normal in structure. Pulmonic valve regurgitation is not visualized. No evidence of pulmonic stenosis.  Aorta: The aortic root and ascending aorta are structurally normal, with no evidence of dilitation.  Venous: The inferior vena cava is dilated in size with less than 50% respiratory variability, suggesting right atrial pressure of 15 mmHg.  IAS/Shunts: The atrial septum is grossly normal.  Additional Comments: 3D was performed not requiring image post processing on an independent workstation and was indeterminate.   LEFT VENTRICLE PLAX 2D LVIDd:         4.60 cm     Diastology LVIDs:         2.90 cm     LV e' medial:    5.55 cm/s LV PW:         1.10 cm     LV E/e' medial:  25.9 LV IVS:        1.50 cm     LV e' lateral:   6.64 cm/s LVOT diam:     2.00 cm     LV E/e' lateral: 21.7 LV SV:         81 LV SV Index:   43 LVOT Area:     3.14 cm  LV Volumes (MOD) LV vol d, MOD A2C: 84.9 ml LV vol d, MOD A4C: 68.3 ml LV vol s, MOD A2C: 27.4 ml LV vol s, MOD A4C: 31.5 ml LV SV MOD A2C:     57.5 ml LV SV MOD A4C:     68.3 ml LV SV MOD BP:      46.8 ml  RIGHT VENTRICLE             IVC RV S prime:     10.90 cm/s  IVC diam: 2.70 cm TAPSE (M-mode): 2.2 cm  LEFT ATRIUM             Index        RIGHT ATRIUM           Index LA diam:        5.20 cm 2.78 cm/m   RA Area:     21.80 cm LA Vol (A2C):   54.6 ml 29.23 ml/m  RA Volume:   64.90 ml  34.74 ml/m LA Vol (A4C):   62.4 ml 33.40 ml/m LA Biplane Vol: 59.4 ml 31.80 ml/m AORTIC VALVE AV Area (Vmax):    1.93 cm AV Area (Vmean):   1.83 cm AV Area (VTI):     1.92 cm AV Vmax:           187.67 cm/s AV Vmean:          122.000 cm/s AV VTI:             0.420 m AV Peak Grad:      14.1 mmHg AV Mean Grad:      9.0 mmHg LVOT Vmax:         115.00 cm/s LVOT Vmean:        71.000 cm/s LVOT VTI:          0.257 m LVOT/AV VTI ratio: 0.61 AI PHT:            514 msec  AORTA Ao Root diam: 3.30 cm Ao Asc diam:  3.25 cm  MITRAL VALVE                TRICUSPID VALVE MV Area (PHT): 4.60 cm     TR Peak grad:   38.7 mmHg MV Area VTI:   1.66 cm     TR Vmax:        311.00 cm/s MV Peak grad:  11.8 mmHg MV Mean grad:  3.0 mmHg     SHUNTS MV Vmax:       1.72 m/s     Systemic VTI:  0.26 m MV Vmean:  74.7 cm/s    Systemic Diam: 2.00 cm MV Decel Time: 165 msec MV E velocity: 144.00 cm/s MV A velocity: 71.80 cm/s MV E/A ratio:  2.01  Riley Lam MD Electronically signed by Riley Lam MD Signature Date/Time: 05/15/2023/5:44:08 PM    Final    MONITORS  LONG TERM MONITOR-LIVE TELEMETRY (3-14 DAYS) 11/19/2020  Narrative  Predominantly sinus rhythm with minimum heart rate of 49 bpm, maximum 114 bpm. Average 68 bpm.  Total of 206 (Nonsustained Ventricular Tachycardia-V. tach fastest 5 beats continue 39 bpm. Longest 9.3 seconds average rate 1 3 5  bpm.  A. fib burden (16%) rate range 62-139 bpm with average rate 105 bpm. Longest was for 17 hr 55 min. Some aberrant conduction suspected.  Rare isolated PACs and PVCs. Some PVC couplets and triplets were noted.  No high-grade AV block noted.  Bursts of V. tach could be A. fib with aberrancy.   Patch Wear Time:  12 days and 19 hours (2022-09-22T09:52:09-398 to 2022-10-05T05:32:17-0400)  Abnormal monitor.  Still shows a significant mount of atrial fibrillation and short burst of nonsustained V. tach.  Thankfully, no high-grade AV block.  I think we can continue amiodarone and probably titrate up beta-blocker.   Bryan Lemma, MD   CT SCANS  CT CORONARY MORPH W/CTA COR W/SCORE 09/28/2020  Addendum 09/29/2020  1:17 PM ADDENDUM REPORT: 09/29/2020 13:14  CLINICAL DATA:   Pre-op transcatheter aortic valve replacement (TAVR)  EXAM: Cardiac TAVR CT  TECHNIQUE: The patient was scanned on a Siemens Force 192 slice scanner. A 120 kV retrospective scan was triggered in the descending thoracic aorta at 111 HU's. Gantry rotation speed was 270 msecs and collimation was .9 mm. The 3D data set was reconstructed in 5% intervals of the R-R cycle. Systolic and diastolic phases were analyzed on a dedicated work station using MPR, MIP and VRT modes. The patient received 95mL OMNIPAQUE IOHEXOL 350 MG/ML SOLN of contrast.  FINDINGS: Aortic Valve: Tricuspid aortic valve. Severely reduced cusp separation. Severely thickened, moderately calcified aortic valve cusps.  AV calcium score: 1049  Virtual Basal Annulus Measurements:  Maximum/Minimum Diameter: 23.1 mm x 18.9 mm  Perimeter: 65.4 mm  Area: 328 mm2  No significant LVOT calcifications.  Based on these measurements, the annulus would be suitable for a 20 mm Edwards Sapien 3 valve vs 23 mm Medtronic Evolut Pro valve. Sinus dimensions are borderline for 26 mm Medtronic Evolut Pro valve. Recommend Heart Team discussion for valve sizing.  Sinus of Valsalva Measurements:  Non-coronary:  26 mm  Right - coronary:  26 mm  Left - coronary:  27 mm  Sinus of Valsalva Height:  Left: 17.5 mm  Right: 18.6 mm  Aorta: Mild aortic atherosclerosis.  Sinotubular Junction: 27 mm  Ascending Thoracic Aorta:  31 mm  Aortic Arch:  24 mm  Descending Thoracic Aorta:  22 mm  Coronary Artery Height above Annulus:  Left Main: 13 mm  Right Coronary: 11.6 mm  Coronary Arteries: Normal origins. 3 vessel coronary artery calcifications.  Optimum Fluoroscopic Angle for Delivery: RAO 1, CRA 1  Moderate mitral annular calcification.  No left atrial appendage thrombus.  IMPRESSION: 1. Tricuspid aortic valve. Severely reduced cusp separation. Severely thickened, moderately calcified aortic valve cusps.  2.  AV  calcium score: 1049  3. Annulus area: 328 mm2, no significant LVOT calcifications. Based on these measurements, the annulus would be suitable for a 20 mm Edwards Sapien 3 valve vs 23 mm Medtronic Evolut Pro valve. Sinus dimensions are borderline for  26 mm Medtronic Evolut Pro valve. Recommend Heart Team discussion for valve sizing.  4.  Sufficient coronary artery height from annulus.  5.  Optimum Fluoroscopic Angle for Delivery: RAO 1, CRA 1   Electronically Signed By: Weston Brass M.D. On: 09/29/2020 13:14  Narrative EXAM: OVER-READ INTERPRETATION  CT CHEST  The following report is an over-read performed by radiologist Dr. Trudie Reed of [facility] on 09/29/2020. This over-read does not include interpretation of cardiac or coronary anatomy or pathology. The coronary calcium score/coronary CTA interpretation by the cardiologist is attached.  COMPARISON:  Cardiac CTA 10/09/2019.  FINDINGS: Extracardiac findings will be described separately under dictation for contemporaneously obtained CTA chest, abdomen and pelvis.  IMPRESSION: Please see separate dictation for contemporaneously obtained CTA chest, abdomen and pelvis dated 09/28/2020 for full description of relevant extracardiac findings.  Electronically Signed: By: Trudie Reed M.D. On: 09/29/2020 06:07   CT SCANS  CT CORONARY FRACTIONAL FLOW RESERVE DATA PREP 10/09/2019  Narrative EXAM: CT FFR ANALYSIS  CLINICAL DATA:  Chest pain/anginal equiv, 24yr CHD risk 10-20%, not treadmill candidate  FINDINGS: FFRct analysis was performed on the original cardiac CT angiogram dataset. Diagrammatic representation of the FFRct analysis is provided in a separate PDF document in PACS. This dictation was created using the PDF document and an interactive 3D model of the results. 3D model is not available in the EMR/PACS. Normal FFR range is >0.80.  1. Left Main:  No significant stenosis. FFR =  0.99  2. LAD: No significant stenosis. Proximal FFR = 0.97, Mid FFR = 0.90, Distal FFR = 0.85 3. LCX: No significant stenosis. Proximal FFR = 0.98, Distal FFR = 0.91 4. RCA: No significant stenosis. Proximal FFR = 0.96, Mid FFR = 0.92, Distal FFR = 0.91  IMPRESSION: 1.  CT FFR analysis did not show any significant stenosis.   Electronically Signed By: Chrystie Nose M.D. On: 10/10/2019 23:18   CT CORONARY MORPH W/CTA COR W/SCORE 10/09/2019  Addendum 10/09/2019  9:12 PM ADDENDUM REPORT: 10/09/2019 21:10  HISTORY: 83 yo female with chest pain/anginal equiv, 35yr CHD risk 10-20%, not treadmill candidate  EXAM: Cardiac/Coronary CTA  TECHNIQUE: The patient was scanned on a [employer].  PROTOCOL: A 120 kV prospective scan was triggered in the descending thoracic aorta at 111 HU's. Axial non-contrast 3 mm slices were carried out through the heart. The data set was analyzed on a dedicated work station and scored using the Agatson method. Gantry rotation speed was 250 msecs and collimation was .6 mm. Beta blockade and 0.8 mg of sl NTG was given. The 3D data set was reconstructed in 5% intervals of the 67-82 % of the R-R cycle. Diastolic phases were analyzed on a dedicated work station using MPR, MIP and VRT modes. The patient received 80mL OMNIPAQUE IOHEXOL 350 MG/ML SOLN of contrast.  FINDINGS: Quality: Fair, HR 59  Coronary calcium score: The patient's coronary artery calcium score is 624, which places the patient in the 82nd percentile.  Coronary arteries: Normal coronary origins.  Right dominance.  Right Coronary Artery: Dominant. Gives off large R-PDA and R-PLB branches. Mild 25-49% proximal mixed stenosis (CADRADS 2). No significant distal or branch disease.  Left Main Coronary Artery: Normal left main. Bifurcates into the LAD and LCx branches.  Left Anterior Descending Coronary Artery: Moderate sized anterior artery which gives off a mid-vessel  diagonal branch. There is diffuse calcified plaque with mild 25-49% proximal stenosis (CADRADS3) and minimal mixed mid to distal vessel stenosis (<  25% - CADRADS1).  Ramus intermedius: Small branch <2.0 mm, mild to moderate ostial disease  Left Circumflex Artery: AV groove vessel. There is a moderate mixed 50-69% proximal stenosis (CADRADS3). Small distal OM branch without disease.  Aorta: Normal size, 30 mm at the mid ascending aorta (level of the PA bifurcation) measured double oblique. Aortic atherosclerosis. No dissection.  Aortic Valve: Trileaflet with leaflet and annular calcification. Probable aortic stenosis.  Other findings:  Normal pulmonary vein drainage into the left atrium.  Normal left atrial appendage without a thrombus.  Dilated main pulmonary artery to 28 mm, suggestive of pulmonary hypertension.  Mitral annular calcification  IMPRESSION: 1. Diffuse multivessel mixed CAD, possibly significant in the proximal LCx, CADRADS = 3. CT FFR will be performed and reported separately.  2. Coronary calcium score of 624. This was 82nd percentile for age and sex matched control.  3. Normal coronary origin with right dominance.  4. Dilated main pulmonary artery to 28 mm, suggestive of pulmonary hypertension.  5. Mitral annular calcification  6. Aortic annular and leaflet calcification, probable aortic stenosis   Electronically Signed By: Hazle Lites M.D. On: 10/09/2019 21:10  Narrative EXAM: OVER-READ INTERPRETATION  CT CHEST  The following report is an over-read performed by radiologist Dr. Alexandria Angel of [facility] on 10/09/2019. This over-read does not include interpretation of cardiac or coronary anatomy or pathology. The coronary calcium score/coronary CTA interpretation by the cardiologist is attached.  COMPARISON:  None.  FINDINGS: Aortic atherosclerosis. Within the visualized portions of the thorax there are no  suspicious appearing pulmonary nodules or masses, there is no acute consolidative airspace disease, no pleural effusions, no pneumothorax and no lymphadenopathy. Visualized portions of the upper abdomen are unremarkable. There are no aggressive appearing lytic or blastic lesions noted in the visualized portions of the skeleton.  IMPRESSION: 1.  Aortic Atherosclerosis (ICD10-I70.0).  Electronically Signed: By: Alexandria Angel M.D. On: 10/09/2019 15:23     ______________________________________________________________________________________________         Assessment & Plan   Acute on Chronic HF Preserved EF - on no MRA due to CKD stage 4 - no SGLT2i due to UTIs - complicated by hypo-albuminemia - transition to torsemide 40 mg PO BID today  S/p TAVR - with PVL and TR  (elevated RVSP) - have not repeated echo; I'm unclear if this would change management; she is a candidate presently for medical therapy  Aspiration PNA and COPD - as per primary  HTN - continue current therapy  CAD - non obstructive, asymptomatic - no statin for HLD and risk factor modification  Persistent AF - complicated by iron deficiency anemia  - continue low dose BB and amiodarone   For questions or updates, please contact [facility] Please consult [email or web address] for contact info under Cardiology/STEMI.      Gloriann Larger, MD FASE [facility] Cardiologist [facility]  [address] [place], [address] [place], [place] [address] [phone]  10:45 AM

## 2023-05-24 NOTE — Plan of Care (Signed)
   Problem: Coping: Goal: Level of anxiety will decrease Outcome: Progressing

## 2023-05-25 ENCOUNTER — Encounter (HOSPITAL_COMMUNITY)

## 2023-05-25 DIAGNOSIS — I480 Paroxysmal atrial fibrillation: Secondary | ICD-10-CM | POA: Diagnosis not present

## 2023-05-25 DIAGNOSIS — I5033 Acute on chronic diastolic (congestive) heart failure: Secondary | ICD-10-CM | POA: Diagnosis not present

## 2023-05-25 DIAGNOSIS — I160 Hypertensive urgency: Secondary | ICD-10-CM | POA: Diagnosis not present

## 2023-05-25 DIAGNOSIS — I2510 Atherosclerotic heart disease of native coronary artery without angina pectoris: Secondary | ICD-10-CM | POA: Diagnosis not present

## 2023-05-25 LAB — BASIC METABOLIC PANEL WITH GFR
Anion gap: 8 (ref 5–15)
BUN: 32 mg/dL — ABNORMAL HIGH (ref 8–23)
CO2: 33 mmol/L — ABNORMAL HIGH (ref 22–32)
Calcium: 8.7 mg/dL — ABNORMAL LOW (ref 8.9–10.3)
Chloride: 89 mmol/L — ABNORMAL LOW (ref 98–111)
Creatinine, Ser: 1.72 mg/dL — ABNORMAL HIGH (ref 0.44–1.00)
GFR, Estimated: 29 mL/min — ABNORMAL LOW (ref >60)
Glucose, Bld: 175 mg/dL — ABNORMAL HIGH (ref 70–99)
Potassium: 4.4 mmol/L (ref 3.5–5.1)
Sodium: 130 mmol/L — ABNORMAL LOW (ref 135–145)

## 2023-05-25 LAB — GLUCOSE, CAPILLARY
Glucose-Capillary: 166 mg/dL — ABNORMAL HIGH (ref 70–99)
Glucose-Capillary: 182 mg/dL — ABNORMAL HIGH (ref 70–99)
Glucose-Capillary: 153 mg/dL — ABNORMAL HIGH (ref 70–99)
Glucose-Capillary: 161 mg/dL — ABNORMAL HIGH (ref 70–99)

## 2023-05-25 LAB — MAGNESIUM: Magnesium: 2.2 mg/dL (ref 1.7–2.4)

## 2023-05-25 MED ORDER — ALBUTEROL SULFATE (2.5 MG/3ML) 0.083% IN NEBU: 2.5000 mg | INHALATION_SOLUTION | RESPIRATORY_TRACT | Status: DC | PRN

## 2023-05-25 MED ORDER — IPRATROPIUM-ALBUTEROL 0.5-2.5 (3) MG/3ML IN SOLN
3.0000 mL | Freq: Four times a day (QID) | RESPIRATORY_TRACT | Status: DC
  Administered 2023-05-25 – 2023-05-27 (×8): 3 mL via RESPIRATORY_TRACT
  Filled 2023-05-25 (×8): qty 3

## 2023-05-25 NOTE — Progress Notes (Signed)
 Physical Therapy Treatment Patient Details Name: Mary Mccarthy MRN: [id number] DOB: May 18, 1940 Today's Date: 05/25/2023   History of Present Illness Patient is a 83 year old female with acute on chronic respiratory failure with hypoxia secondary to acute on chronic diastolic CHF. Pt found to have aspiration PNA. History of HTN, hyperlipidemia, respiratory failure on 2 L, PAF, GI bleed, aortic stenosis s/p TAVR,  transient heart block, diabetes mellitus type II, CAD, hypothyroidism, GERD    PT Comments  Pt found to be soiled in bed, she engaged in rolling R/L with minA using bed rails for pericare to be addressed. Introduced Occupational hygienist to advance OOB mobility without a chair follow as well as allow the pt to use the AD she's familiar with when ambulating in the community. Pt stood with minA and displayed proper hand positioning using rollator. She ambulated within the room demonstrating multiple gait/postural abnormalities and impaired cardiopulmonary endurance. Pt desaturated to 84% SpO2 on 8L O2, required bump up to 10L, prolonged seated rest break, and cues for PLB technique to recover SpO2 to 88%. Returned pt to recliner chair at end of session and attempted to titrate her back down to 7L O2 via HFNC, but pt desaturated to 85% SpO2, required 9L for SpO2 88-89%. Patient will benefit from continued inpatient follow up therapy, <3 hours/day    If plan is discharge home, recommend the following: A lot of help with walking and/or transfers;A lot of help with bathing/dressing/bathroom;Assistance with cooking/housework;Assist for transportation;Help with stairs or ramp for entrance   Can travel by private vehicle     No  Equipment Recommendations  Wheelchair (measurements PT);Wheelchair cushion (measurements PT);BSC/3in1    Recommendations for Other Services       Precautions / Restrictions Precautions Precautions: Fall Recall of Precautions/Restrictions: Intact Precaution/Restrictions  Comments: watch SpO2 Restrictions Weight Bearing Restrictions Per Provider Order: No     Mobility  Bed Mobility Overal bed mobility: Needs Assistance Bed Mobility: Rolling, Supine to Sit Rolling: Used rails, Min assist   Supine to sit: Min assist     General bed mobility comments: Pt rolled R/L with VC on sequencing. She flexed opposite knee and reached with opposite hand to the bedrail she was turnign to, assist required at trunk/pelvis to faciliate full turn into sidelying. Pt maintained sidelying while pericare was addressed. Pt sat up on L side of bed with increased time. She pulled on NT to achieve upright posture and scoot fwd til feet supported.    Transfers Overall transfer level: Needs assistance Equipment used: Rollator (4 wheels) Transfers: Sit to/from Stand, Bed to chair/wheelchair/BSC Sit to Stand: Min assist           General transfer comment: Pt stood from lowest bed height. She required minA to power up and took increased time to achieve standing. Pt maintains a fwd flex posture. Pt took a seated rest break on rollator seat, required minA to sequence the stand pivot.  Good eccentric control with sitting and reaching back to the surface. Pt required minA using bedpad to scoot back in recliner chair.    Ambulation/Gait Ambulation/Gait assistance: Contact guard assist, Min assist Gait Distance (Feet): 6 Feet (x2, prolonged seated rest in between bouts) Assistive device: Rollator (4 wheels) Gait Pattern/deviations: Step-to pattern, Decreased step length - right, Decreased step length - left, Trunk flexed, Narrow base of support Gait velocity: decreased Gait velocity interpretation: <1.31 ft/sec, indicative of household ambulator   General Gait Details: Pt ambulated with very short slow steps and hunched  fwd posture over rollator. Pt maintained forearms resting over rollator handles, VC to hold onto grips. MinA to control rollator and keep pt within the AD and close  to it. Pt began to have a coughing fit and experienced urinary incontinence in room. Addressed pericare and provided seated rest on rollator seat before sitting in recliner chair. Cued PLB technique throughout ambulation.   Stairs             Wheelchair Mobility     Tilt Bed    Modified Rankin (Stroke Patients Only)       Balance Overall balance assessment: Needs assistance Sitting-balance support: Feet supported Sitting balance-[name]: Fair     Standing balance support: Bilateral upper extremity supported, During functional activity, Reliant on assistive device for balance Standing balance-[name]: Poor Standing balance comment: Pt dependent on BUE support during OOB mobility and relies on rollator.                            Communication Communication Communication: Impaired Factors Affecting Communication: Hearing impaired  Cognition Arousal: Alert Behavior During Therapy: WFL for tasks assessed/performed   PT - Cognitive impairments: No apparent impairments                         Following commands: Intact      Cueing Cueing Techniques: Verbal cues, Gestural cues  Exercises      General Comments General comments (skin integrity, edema, etc.): Pt greeted on 7L O2 via HFNC. Transferred her to 8L from O2 tank prior to transfer/gait. Pt desaturated to 84% SpO2 with mobility. Cued PLB technique, facilitated rest, and bumped up to 10L which improved SpO2 to 88% SpO2. When seated resting in recliner chair attempted to titrate down to 7L, but pt desaturated to 85% SpO2. Left on 9L O2 for SpO2 88-89%.      Pertinent Vitals/Pain Pain Assessment Pain Assessment: No/denies pain    Home Living                          Prior Function            PT Goals (current goals can now be found in the care plan section) Acute Rehab PT Goals Patient Stated Goal: Get better Progress towards PT goals: Progressing toward goals     Frequency    Min 2X/week      PT Plan      Co-evaluation              AM-PAC PT "6 Clicks" Mobility   Outcome Measure  Help needed turning from your back to your side while in a flat bed without using bedrails?: A Lot Help needed moving from lying on your back to sitting on the side of a flat bed without using bedrails?: A Lot Help needed moving to and from a bed to a chair (including a wheelchair)?: A Little Help needed standing up from a chair using your arms (e.g., wheelchair or bedside chair)?: A Little Help needed to walk in hospital room?: A Lot Help needed climbing 3-5 steps with a railing? : Total 6 Click Score: 13    End of Session Equipment Utilized During Treatment: Gait belt;Oxygen  Activity Tolerance: Patient limited by fatigue;Other (comment) (Treatment limited secondary to urinary incontinence) Patient left: in chair;with call bell/phone within reach Nurse Communication: Mobility status;Other (comment) (Pt's desaturation during session and need  to remain on 9L at end of session. Bowel movements and need for sheets to be replaced and purewick to be placed.) PT Visit Diagnosis: Muscle weakness (generalized) (M62.81);Unsteadiness on feet (R26.81)     Time: 9604-5409 PT Time Calculation (min) (ACUTE ONLY): 28 min  Charges:    $Gait Training: 8-22 mins $Therapeutic Activity: 8-22 mins PT General Charges $$ ACUTE PT VISIT: 1 Visit                     Glenford Lanes, PT, DPT Acute Rehabilitation Services Office: [phone] Secure Chat Preferred  Riva Chester 05/25/2023, 4:39 PM

## 2023-05-25 NOTE — Plan of Care (Signed)

## 2023-05-25 NOTE — Progress Notes (Addendum)
 Heart Failure Stewardship Pharmacist Progress Note   PCP: Roslyn Coombe, MD PCP-Cardiologist: Randene Bustard, MD    HPI:  83 yo F with PMH of HTN, HLD, T2DM, CKD3, CAD, paroxysmal afib, recurrent GI bleed, severe aortic stenosis s/p TAVR complicated with transient heart block, respiratory failure on 2L [place] at BL, hypothyroidism, GERD   Patient previously admitted 02/2023 for CHF exacerbation. Patient was diuresed during admission and home lasix  was increased from prn to daily.   Presented to the ED on 4/7 with worsening SOB with exertion and leg swelling over the last 2 weeks. Also presented with productive cough, chest pain/tightness, NVD, and wheezing. Reports PCP instructed her to take an additional dose of 20mg  lasix  in the setting of weight gain of 3 lb - no improvement per pt. Endorses right flank pain 2/2 coughing.   In the ED, patient was hypertensive and hypoxic (did not present on her home O2) wheezing, rhonchi, rales and BL LE edema present on exam. Slight murmur present. JVD Patient diuresed with lasix  40mg  x2 in the ED. CXR showed mild to moderate pulmonary vascular congestion. No dense consolidation or lung collapse. ECHO 4/8 showed LVEF 60-65% (unchanged from prior 11/2022) with normal LV function. Severe asymmetric LV hypertrophy of septal segment. LV diastolic function could not be evaluated. Normal RV function. Mildly enlarged LV. MV is degenerative with trivial regurgitation. Mild stenosis and severe annular calcifications also present. Mild to moderate tricuspid valve regurgitation. Cardiology was consulted, planning with diuresis for now. Repeat CXR 4/17 showing mild decrease in patchy multifocal airspace a nodular airspace. Opacities and cardiomegaly.  Denies SOB and endorses that cough is better than yesterday. Mentions that her breathing is significantly better. Of note, O2 requirement down to 7L in the room and patient is aware that she is requiring less O2 than this  morning. No leg swelling on exam. Patient expresses that she wants to go home and was informed, physicians will discharge her once stable and safe to do so.   Current HF Medications: Diuretic: Torsemide  40mg  BID Beta Blocker: Metoprolol  succinate 12.5mg  daily  Other: isosorbide  mononitrate 60mg  daily, hydralazine  75mg  q8h  Prior to admission HF Medications: Diuretic: Lasix  60mg  daily Beta blocker: Metoprolol  succinate 12.5mg  daily  Other: Isosorbide  mononitrate 15mg  daily, hydralazine  25mg  q8h  Pertinent Lab Values: Serum creatinine 1.74>1.49>1.5>1.55>1.74>1.72 (BL 1.3-1.5?), BUN 29>27>27, Potassium 4>4.4, Sodium 135>130, BNP 977.8, Magnesium  1.9> 2.2 , A1c 6.5   Vital Signs: Weight: (bed) 174> (bed) 181 lbs (admission weight: 191 lbs) Blood pressure: 120-140s/30-60s  Heart rate: 50-70s I/O: net - yesterday; net -5.6L since admission 3L [place] > 10L HFNC > 8L [place] >10L HFNC >11L HFNC > 21L HFNC  Medication Assistance / Insurance Benefits Check: Does the patient have prescription insurance?  Yes Type of insurance plan: Medicare  Outpatient Pharmacy:  Prior to admission outpatient pharmacy: [facility]  Is the patient willing to use [facility] TOC pharmacy at discharge? Yes Is the patient willing to transition their outpatient pharmacy to utilize a [facility] outpatient pharmacy? No    Assessment: 1. Acute on chronic diastolic CHF (LVEF 60-65%), due to unknown etiology . NYHA class 2-3 symptoms.Strict I/Os and daily weights. Keep K>4 and Mg>2. Scr worsening.Overall oxygen  status not at baseline - due to ?aspiration PNA or volume overload.  Cardiology considering torsemide  40mg  BID upon discharge. Hospitalist considering organizing PNA with f/u CT chest in next 48 hours.  - Continue hydralazine  75mg  q8h  - Agree with deferring SGLT2i given hx of  3 UTI in prior 1-2 months  - Defer titrating Metoprolol  succinate to 25mg  daily for BP control until euvolemic  - Consider ACE/ARB/ARNI or MRA  until renal function stabilizes, confirmed fluid status  Plan: 1) Medication changes recommended at this time: - Monitor O2 status and HR  - Monitor renal function trend   2) Patient assistance: - Contacted son and he is interested in utilizing [facility] for medication changes upon discharge  but would like [facility] and remaining prescriptions  - Son reported that current medications are affordable - Jardiance  PA in progress   3)  Education  - Patient has been educated on current HF medications and potential additions to HF medication regimen - Patient verbalizes understanding that over the next few months, these medication doses may change and more medications may be added to optimize HF regimen - Patient has been educated on basic disease state pathophysiology and goals of therapy   Harvest Lineman, PharmD PGY1 Pharmacy Resident

## 2023-05-25 NOTE — Plan of Care (Signed)
   Problem: Education: Goal: Knowledge of General Education information will improve Description: Including pain rating scale, medication(s)/side effects and non-pharmacologic comfort measures Outcome: Progressing   Problem: Pain Managment: Goal: General experience of comfort will improve and/or be controlled Outcome: Progressing   Problem: Safety: Goal: Ability to remain free from injury will improve Outcome: Progressing

## 2023-05-25 NOTE — Progress Notes (Addendum)
 Patient Name: Mary Mccarthy Date of Encounter: 05/25/2023 [facility] Cardiologist: Randene Bustard, MD    Interval Summary  .    Patient reports feeling better this AM. Breathing significantly improved. No chest pain. Unsure if she has orthopnea because she has not tried to lay flat   Vital Signs .    Vitals:   05/24/23 2033 05/25/23 0139 05/25/23 0500 05/25/23 0735  BP: (!) 146/54 (!) 136/45 (!) 146/50 (!) 122/44  Pulse:  (!) 56 (!) 54 63  Resp:  18 16 (!) 21  Temp:  97.7 F (36.5 C) (!) 97.5 F (36.4 C) 97.6 F (36.4 C)  TempSrc:  Oral Oral Oral  SpO2:  98% 96% 96%  Weight:   79.3 kg   Height:        Intake/Output Summary (Last 24 hours) at 05/25/2023 0910 Last data filed at 05/25/2023 0142 Gross per 24 hour  Intake 3 ml  Output 450 ml  Net -447 ml      05/25/2023    5:00 AM 05/24/2023    5:05 AM 05/23/2023    4:46 AM  Last 3 Weights  Weight (lbs) 174 lb 13.2 oz 181 lb 7 oz 181 lb 3.5 oz  Weight (kg) 79.3 kg 82.3 kg 82.2 kg      Telemetry/ECG    NSR - Personally Reviewed  Physical Exam .   GEN: No acute distress.  Elderly female, sitting upright in the bed  Neck: No JVD Cardiac:  RRR. Faint systolic murmur  Respiratory:  Coarse breath sounds bilaterally. Normal WOB on 7 L via [place]  GI: Soft, nontender, non-distended  MS: Trace nonpitting edema in BLE   Assessment & Plan .    Acute on chronic HFpEF  - Echocardiogram from 05/15/23 showed EF 60-65%, no regional wall motion abnormalities, severe asymmetric LVH, normal RV systolic function, moderately elevated PA systolic pressure, mild MS - GDMT limited- not on MRA or ARB/ARNI due to CKD stage 4. Not on SGLTI2 due to UTIs - Continue metoprolol  succinate 12.5 mg daily, imdur  60 mg daily, hydralazine  75 mg TID  - Complicated by hypoalbuminemia with albumin 2. She continues to have minimal edema in BLE, likely due to low albumin - Continue torsemide  40 mg BID   S/p TAVR  - Echo this admission  showed mild paravalvular leak adjacent to the intervalvular fibrosa, echo findings consistent with normal structure and function of the aortic valve prosthesis with mean gradient 9.0 mmHg   Persistent atrial flutter  - Per telemetry, patient is maintaining NSR  - Complicated by iron  deficiency anemia - not on AC  - Continue metoprolol  succinate 12.5 mg daily - Amiodarone  stopped yesterday due to concern for possible amiodarone  toxicity   Aspiration pneumonia  COPD  - Managed by primary   HTN  - BP improving- 122/44 this AM  - Continue hydralazine  75 mg TID, imdur  60 mg daily, metoprolol  succinate 12.5 mg daily, torsemide  40 mg BID   CAD  - mild non-obstructive disease on cath in 2022 - Continue lipitor  40 mg daily   For questions or updates, please contact [facility] Please consult [email or web address] for contact info under        Signed, Debria Fang, PA-C   I have personally seen and examined the patient.  My HPI, Exam, and assessment and plan are below, independent of the NPP above.  Mary Mccarthy as has subjectively experienced significant improvement and feels she is back to her baseline,  which includes being able to walk around, sit in a recliner, and occasionally walk to the kitchen to make food. Her lifestyle is fairly sedentary.  She is on seven liters of oxygen . No chest pain, no tachypnea.  Exam notable for  Gen: chronically ill appearing   Neck: No JVD Cardiac: No Rubs or Gallops, systolic and diastolic murmurs, RRR Respiratory: bilateral rhonchi, decreased effort, normal  respiratory rate GI: Soft, nontender, non-distended  MS: Non pitting edema;  moves all extremities Integument: Skin feels warm Neuro:  At time of evaluation, alert and oriented to person/place/time/situation; she is Hard of Hearing Psych: Normal affect, patient feels well  Tele: SR  In assessment and plan:   Heart failure with preserved ejection fraction Acute and chronic heart  failure with preserved ejection fraction. Lower extremity edema related to hypoalbuminemia with an albumin level of 2. She is working to increase her oral intake to address this. - Continue oral torsemide   Paravalvular leak post-TAVR - Status post transcatheter aortic valve replacement (TAVR) with a paravalvular leak. No plan for re-intervention.  Mild mitral stenosis Mild degenerative mitral stenosis. No plan for intervention. Continue BB  Persistent atrial fibrillation Persistent atrial fibrillation status post cardioversion, currently in sinus rhythm. Not on anticoagulation and has tolerated low-dose beta blockade. - Amiodarone  was stopped due to concerns about amyotoxicity contributing to her chronic hypoxic respiratory failure.  Acute and chronic hypoxic respiratory failure History of COPD with complicated acute and chronic hypoxic respiratory failure. Reports significant improvement and feels she is back to her baseline, which includes being able to walk around, sit in a recliner, and occasionally walk to the kitchen. She is on 7 liters of oxygen  and has coarse breath sounds bilaterally without tachypnea.   Chronic kidney disease stage 4 Chronic kidney disease stage 4. Not on an MRI or an SGLT2 inhibitor. History of UTIs and not on SGLT2 inhibitors for this indication.  Hypertension Blood pressure is well controlled. Afterload reduction has been significantly increased with hydralazine  and Imdur .  Nonobstructive coronary artery disease - asymptomatic; continue statin  Goals of Care Long-term prognosis is considered guarded. Care has been discussed with her son, Allayne Arabian.  We have discuss long term concerns; no GOC changes made today.  Gloriann Larger, MD FASE [facility] Cardiologist [facility]  [address] [place], [address] [place], [place] [address] [phone]  11:49 AM

## 2023-05-25 NOTE — Progress Notes (Addendum)
 Progress Note   Patient: Mary Mccarthy FMW:[id number] DOB: 1940-08-21 DOA: 05/14/2023     11 DOS: the patient was seen and examined on 05/25/2023   Brief hospital course: Mary Mccarthy was admitted to the hospital with the working diagnosis of heart failure exacerbation.  83 yo female with the past medical history of hypertension, hyperlipidemia, paroxysmal atrial fibrillation, aortic stenosis sp TAVR, T2DM and obesity who presented with dyspnea and lower extremity edema. Reported 2 weeks of worsening symptoms, along with weight gain. Her dyspnea and edema were persistent despite increasing the dose of her oral furosemide  at home.  At the time of her initial physical examination her 02 saturation was 75% on room air, blood pressure 196/70, HR 62, RR 30.  Lungs with decreased breath sounds and rales bilaterally, up to mid zones, heart with S1 and S2 present and regular, abdomen with no distention and positive bilateral lower extremity, ++ pitting edema.   Na 135, K 4,1 Cl 100, bicarbonate 25 glucose 109, bun 24 cr 1,98  BNP 977 High sensitive troponin 35 and 31  Lactic acid 2,0 and 1,0  Wbc 5,2 hgb 8,1 plt 223  TSH 5,6  Sars covid 19 negative  Influenza negative  RSV negative  Urine analysis SG 1,014, protein 30, negative leukocytes and negative hgb   Chest radiograph with hypoinflation, positive cardiomegaly, bilateral hilar vascular congestion with cephalization of the vasculature.   EKG 62 bpm, right axis deviation, qtc 494, sinus rhythm with poor R R wave progression with no significant ST segment or T wave changes.   Patient was placed on aggressive diuresis.  04/13 developed aspiration pneumonia and was placed on antibiotic therapy.   Assessment and Plan: * Acute on chronic diastolic CHF (congestive heart failure) (HCC) Echocardiogram with preserved LV systolic function with EF 60 to 65%, severe LVH, RV systolic function preserved, RVSP 53.7 mmHg, LA and RA with mild  dilatation, mild mitral stenosis, mild to moderate tricuspid valve regurgitation, sp TAVR with mild paravalvular leak   Documented urine output 450 ml, since admission negative fluid balance - 9,214 ml.   Plan to continue diuresis with torsemide  40 mg bid.  Afterload reduction with hydralazine  and isosorbide .  B blocker with metoprolol .  Limited pharmacologic therapy due to reduced GFR.   Acute on chronic hypoxemic respiratory failure due to acute cardiogenic pulmonary edema. 02 saturation is 96% on 7 L/min per high flow nasal cannula.  04/17 Chest radiograph with bilateral interstitial infiltrates, with mild improvement on the right side.  Possible organizing pneumonia, possible amiodarone  toxicity discontinued amiodarone  Continue with systemic corticosteroids and bronchodilators.   Paroxysmal atrial fibrillation (HCC) Continue rate control with metoprolol .  Off amiodarone  due to possible lung toxicity  Patient not on anticoagulation.   Coronary artery disease, non-occlusive Continue atorvastatin  and blood pressure control   Hypertensive urgency Continue blood pressure control with metoprolol , hydralazine  and isosorbide .   CKD (chronic kidney disease), stage IV (HCC) Hyponatremia.   Renal function with serum cr at 1,72 with K at 4,4 and serum bicarbonate at 33  Na 130 and Mg 2.2   Follow up renal function and electrolytes Continue diuresis with torsemide    Type 2 diabetes mellitus with hyperlipidemia (HCC) Continue insulin  sliding scale for glucose cover and monitoring  Basal insulin  20 units.  Continue statin   Anxiety with depression Continue mirtazapine  and alprazolam .   Hypothyroidism Continue with levothyroxine    Normocytic anemia Cell count has been stable.   Aspiration pneumonia (HCC) 04/13 radiograph with multiple  patchy bilateral infiltrates.  Patient afebrile and no leukocytosis  Continue with po Augmentin   Continue airway clearing techniques with  flutter valve and incentive spirometer.   Possible organizing pneumonia, holding amiodarone  and on high dose systemic corticosteroids.  Possible follow up CT chest non contrast in the next 48 hrs.     Subjective: Patient with mild improvement in dyspnea and decreased cough, no chest pain, continue very weak and deconditioned   Physical Exam: Vitals:   05/24/23 2033 05/25/23 0139 05/25/23 0500 05/25/23 0735  BP: (!) 146/54 (!) 136/45 (!) 146/50 (!) 122/44  Pulse:  (!) 56 (!) 54 63  Resp:  18 16 (!) 21  Temp:  97.7 F (36.5 C) (!) 97.5 F (36.4 C) 97.6 F (36.4 C)  TempSrc:  Oral Oral Oral  SpO2:  98% 96% 96%  Weight:   79.3 kg   Height:       Neurology awake and alert, deconditioned and ill looking appearing  ENT with mild pallor Cardiovascular with S1 and S2 present and regular with systolic murmur at the right lower sternal border No JVD Respiratory with rales bilaterally with no rhonchi or wheezing, on anterior auscultation.  Abdomen with no distention  Lower extremity edema is trace.  Data Reviewed:    Family Communication: no family at the bedside   Disposition: Status is: Inpatient Remains inpatient appropriate because: high 02 requirements   Planned Discharge Destination:  to be determined      Author: Elidia Toribio Furnace, MD 05/25/2023 10:00 AM  For on call review [email or web address].

## 2023-05-26 DIAGNOSIS — I160 Hypertensive urgency: Secondary | ICD-10-CM | POA: Diagnosis not present

## 2023-05-26 DIAGNOSIS — N184 Chronic kidney disease, stage 4 (severe): Secondary | ICD-10-CM | POA: Diagnosis not present

## 2023-05-26 DIAGNOSIS — I2510 Atherosclerotic heart disease of native coronary artery without angina pectoris: Secondary | ICD-10-CM | POA: Diagnosis not present

## 2023-05-26 DIAGNOSIS — I5033 Acute on chronic diastolic (congestive) heart failure: Secondary | ICD-10-CM | POA: Diagnosis not present

## 2023-05-26 DIAGNOSIS — I480 Paroxysmal atrial fibrillation: Secondary | ICD-10-CM | POA: Diagnosis not present

## 2023-05-26 DIAGNOSIS — J690 Pneumonitis due to inhalation of food and vomit: Secondary | ICD-10-CM | POA: Diagnosis not present

## 2023-05-26 LAB — GLUCOSE, CAPILLARY
Glucose-Capillary: 161 mg/dL — ABNORMAL HIGH (ref 70–99)
Glucose-Capillary: 232 mg/dL — ABNORMAL HIGH (ref 70–99)
Glucose-Capillary: 222 mg/dL — ABNORMAL HIGH (ref 70–99)
Glucose-Capillary: 229 mg/dL — ABNORMAL HIGH (ref 70–99)
Glucose-Capillary: 237 mg/dL — ABNORMAL HIGH (ref 70–99)

## 2023-05-26 LAB — BASIC METABOLIC PANEL WITH GFR
Anion gap: 9 (ref 5–15)
BUN: 62 mg/dL — ABNORMAL HIGH (ref 8–23)
CO2: 31 mmol/L (ref 22–32)
Calcium: 8.6 mg/dL — ABNORMAL LOW (ref 8.9–10.3)
Chloride: 90 mmol/L — ABNORMAL LOW (ref 98–111)
Creatinine, Ser: 3.04 mg/dL — ABNORMAL HIGH (ref 0.44–1.00)
GFR, Estimated: 15 mL/min — ABNORMAL LOW (ref >60)
Glucose, Bld: 225 mg/dL — ABNORMAL HIGH (ref 70–99)
Potassium: 4.6 mmol/L (ref 3.5–5.1)
Sodium: 130 mmol/L — ABNORMAL LOW (ref 135–145)

## 2023-05-26 LAB — MAGNESIUM: Magnesium: 2.3 mg/dL (ref 1.7–2.4)

## 2023-05-26 MED ORDER — FUROSEMIDE 10 MG/ML IJ SOLN
80.0000 mg | Freq: Two times a day (BID) | INTRAMUSCULAR | Status: DC
  Administered 2023-05-26: 80 mg via INTRAVENOUS
  Filled 2023-05-26: qty 8

## 2023-05-26 NOTE — Plan of Care (Signed)

## 2023-05-26 NOTE — Progress Notes (Signed)
 Cardiology Progress Note  Patient ID: Mary Mccarthy MRN: [id number] DOB: 18-Dec-1940 Date of Encounter: 05/26/2023 Primary Cardiologist: Randene Bustard, MD  Subjective   Chief Complaint: SOB  HPI: Remains volume overloaded. Needs IV diuresis.   ROS:  All other ROS reviewed and negative. Pertinent positives noted in the HPI.     Telemetry  Overnight telemetry shows sinus rhythm 60 to 70 bpm, which I personally reviewed.   Physical Exam   Vitals:   05/26/23 0456 05/26/23 0500 05/26/23 0802 05/26/23 0823  BP: (!) 133/45  (!) 127/45   Pulse: 66  68 70  Resp: 18  20 (!) 27  Temp: 97.6 F (36.4 C) 97.6 F (36.4 C) 97.8 F (36.6 C)   TempSrc: Oral Oral Oral   SpO2: 94%  100% 98%  Weight: 81.9 kg     Height:        Intake/Output Summary (Last 24 hours) at 05/26/2023 0905 Last data filed at 05/26/2023 0501 Gross per 24 hour  Intake 580 ml  Output 250 ml  Net 330 ml       05/26/2023    4:56 AM 05/25/2023    5:00 AM 05/24/2023    5:05 AM  Last 3 Weights  Weight (lbs) 180 lb 8.9 oz 174 lb 13.2 oz 181 lb 7 oz  Weight (kg) 81.9 kg 79.3 kg 82.3 kg    Body mass index is 34.12 kg/m.  General: Well nourished, well developed, in no acute distress Head: Atraumatic, normal size  Eyes: PEERLA, EOMI  Neck: Supple, JVD 10 to 12 cm water Endocrine: No thryomegaly Cardiac: Normal S1, S2; RRR; no murmurs, rubs, or gallops Lungs: Crackles bilaterally Abd: Soft, nontender, no hepatomegaly  Ext: No edema, pulses 2+ Musculoskeletal: No deformities, BUE and BLE strength normal and equal Skin: Warm and dry, no rashes   Neuro: Alert and oriented to person, place, time, and situation, CNII-XII grossly intact, no focal deficits  Psych: Normal mood and affect   Cardiac Studies  TTE 05/15/2023  1. Left ventricular ejection fraction, by estimation, is 60 to 65%. The  left ventricle has normal function. The left ventricle has no regional  wall motion abnormalities. There is severe  asymmetric left ventricular  hypertrophy of the septal segment. Left   ventricular diastolic function could not be evaluated.   2. Right ventricular systolic function is normal. The right ventricular  size is mildly enlarged. There is moderately elevated pulmonary artery  systolic pressure. The estimated right ventricular systolic pressure is  53.7 mmHg.   3. Left atrial size was mildly dilated.   4. Right atrial size was mildly dilated.   5. The mitral valve is degenerative. Trivial mitral valve regurgitation.  Mild mitral stenosis. The mean mitral valve gradient is 3.0 mmHg. Severe  mitral annular calcification.   6. Tricuspid valve regurgitation is mild to moderate.   7. Mild paravalvular leak adjacent to the intervalvular fibrosa. The  aortic valve has been repaired/replaced. Aortic valve regurgitation is  mild. No aortic stenosis is present. There is a 26 mm [employer] stented  (TAVR) valve present in the aortic  position. Procedure Date: 10/26/2020. Echo findings are consistent with  normal structure and function of the aortic valve prosthesis. Echo  findings are consistent with perivalvular leak of the aortic prosthesis.  Aortic regurgitation PHT measures 514 msec.   Aortic valve mean gradient measures 9.0 mmHg. Aortic valve Vmax measures  1.88 m/s.   8. The inferior vena cava is dilated in size with <  50% respiratory  variability, suggesting right atrial pressure of 15 mmHg.   Patient Profile  Mary Mccarthy is a 83 y.o. female with persistent atrial fibrillation, HFpEF, aortic stenosis status post TAVR, diabetes, obesity who was admitted on 05/14/2023 for acute on chronic diastolic heart failure.  Assessment & Plan   # Acute on chronic diastolic heart failure - EF is normal.  Still volume up. - Transition back to oral diuretics.  80 mg IV twice daily. - Close monitoring of CKD.  She seems to hold most of her fluid centrally.  # Persistent atrial fibrillation -  Amiodarone  on hold due to concerns for possible lung toxicity. - Continue beta-blocker. - She is not on oral anticoagulation at home due to history of bleeding.  # Aspiration pneumonia # Possible organizing pneumonia # Possible amiodarone  lung toxicity - Continue antibiotics and steroids per primary team.  We are holding amiodarone .  # CKD stage IIIb/IV - Kidney function is stable.  Close monitoring with diuresis.  # HTN - Continue home metoprolol , Imdur  hydralazine .  # Nonobstructive CAD - Statin.  Does not appear to be on aspirin .  # Aortic stenosis status post TAVR - Stable.      For questions or updates, please contact [facility] Please consult [email or web address] for contact info under    Signed, Melodee Spruce T. Rolm Clos, MD, [facility] [place]  [facility]  05/26/2023 9:05 AM

## 2023-05-26 NOTE — Progress Notes (Signed)
 Progress Note   Patient: Mary Mccarthy ZOX:[id number] DOB: Dec 16, 1940 DOA: 05/14/2023     12 DOS: the patient was seen and examined on 05/26/2023   Brief hospital course: Mary Mccarthy was admitted to the hospital with the working diagnosis of heart failure exacerbation.  83 yo female with the past medical history of hypertension, hyperlipidemia, paroxysmal atrial fibrillation, aortic stenosis sp TAVR, T2DM and obesity who presented with dyspnea and lower extremity edema. Reported 2 weeks of worsening symptoms, along with weight gain. Her dyspnea and edema were persistent despite increasing the dose of her oral furosemide  at home.  At the time of her initial physical examination her 02 saturation was 75% on room air, blood pressure 196/70, HR 62, RR 30.  Lungs with decreased breath sounds and rales bilaterally, up to mid zones, heart with S1 and S2 present and regular, abdomen with no distention and positive bilateral lower extremity, ++ pitting edema.   Na 135, K 4,1 Cl 100, bicarbonate 25 glucose 109, bun 24 cr 1,98  BNP 977 High sensitive troponin 35 and 31  Lactic acid 2,0 and 1,0  Wbc 5,2 hgb 8,1 plt 223  TSH 5,6  Sars covid 19 negative  Influenza negative  RSV negative  Urine analysis SG 1,014, protein 30, negative leukocytes and negative hgb   Chest radiograph with hypoinflation, positive cardiomegaly, bilateral hilar vascular congestion with cephalization of the vasculature.   EKG 62 bpm, right axis deviation, qtc 494, sinus rhythm with poor R R wave progression with no significant ST segment or T wave changes.   Patient was placed on aggressive diuresis.  04/13 developed aspiration pneumonia and was placed on antibiotic therapy.  04/19 placed on steroids for organizing pneumonia and continue IV furosemide  for diuresis. Improving 02 requirements.   Assessment and Plan: * Acute on chronic diastolic CHF (congestive heart failure) (HCC) Echocardiogram with preserved LV  systolic function with EF 60 to 65%, severe LVH, RV systolic function preserved, RVSP 53.7 mmHg, LA and RA with mild dilatation, mild mitral stenosis, mild to moderate tricuspid valve regurgitation, sp TAVR with mild paravalvular leak   Documented urine output 250 ml, since admission negative fluid balance - 9,214 ml.   Afterload reduction with hydralazine  and isosorbide .  B blocker with metoprolol .  Resumed IV furosemide  for more aggressive diuresis.  Limited pharmacologic therapy due to reduced GFR.   Acute on chronic hypoxemic respiratory failure due to acute cardiogenic pulmonary edema. 04/17 Chest radiograph with bilateral interstitial infiltrates, with mild improvement on the right side.  Possible organizing pneumonia, possible amiodarone  toxicity discontinued amiodarone  Continue with systemic corticosteroids and bronchodilators.  02 saturation today is 95% on 5 L/min per [place]   Paroxysmal atrial fibrillation (HCC) Continue rate control with metoprolol .  Off amiodarone  due to possible lung toxicity  Patient not on anticoagulation.   Coronary artery disease, non-occlusive Continue atorvastatin  and blood pressure control   Hypertensive urgency Continue blood pressure control with metoprolol , hydralazine  and isosorbide .   CKD (chronic kidney disease), stage IV (HCC) Hyponatremia.   Pending renal function and electrolytes today.  Now on IV furosemide .   Type 2 diabetes mellitus with hyperlipidemia (HCC) Continue insulin  sliding scale for glucose cover and monitoring  Basal insulin  20 units.  Continue statin   Anxiety with depression Continue mirtazapine  and alprazolam .   Hypothyroidism Continue with levothyroxine    Normocytic anemia Cell count has been stable.   Aspiration pneumonia (HCC) 04/13 radiograph with multiple patchy bilateral infiltrates.  Patient afebrile and no leukocytosis  Continue with po Augmentin   Continue airway clearing techniques with flutter  valve and incentive spirometer.   Possible organizing pneumonia, holding amiodarone  and on high dose systemic corticosteroids.  Improving oxygenation, will hold on CT scan for now.        Subjective: Patient with improvement in dyspnea, no chest pain, continue very weak and deconditioned   Physical Exam: Vitals:   05/26/23 0823 05/26/23 1400 05/26/23 1409 05/26/23 1431  BP:    (!) 119/46  Pulse: 70  70 68  Resp: (!) 27 16 20 17   Temp:      TempSrc:    Oral  SpO2: 98%  96% 96%  Weight:      Height:       Neurology awake and alert ENT with mild pallor  Cardiovascular with S1 and S2 present, regular with no gallops, rubs or murmurs Respiratory with rales bilaterally with no wheezing or rhonchi, noted improved work of breathing  Abdomen with no distention  Lower extremity edema mild  Data Reviewed:    Family Communication: no family at the bedside   Disposition: Status is: Inpatient Remains inpatient appropriate because: IV diuresis   Planned Discharge Destination: Home     Author: Albertus Alt, MD 05/26/2023 3:18 PM  For on call review [email or web address].

## 2023-05-27 ENCOUNTER — Inpatient Hospital Stay (HOSPITAL_COMMUNITY)

## 2023-05-27 DIAGNOSIS — I2510 Atherosclerotic heart disease of native coronary artery without angina pectoris: Secondary | ICD-10-CM | POA: Diagnosis not present

## 2023-05-27 DIAGNOSIS — I160 Hypertensive urgency: Secondary | ICD-10-CM | POA: Diagnosis not present

## 2023-05-27 DIAGNOSIS — I480 Paroxysmal atrial fibrillation: Secondary | ICD-10-CM | POA: Diagnosis not present

## 2023-05-27 DIAGNOSIS — I5033 Acute on chronic diastolic (congestive) heart failure: Secondary | ICD-10-CM | POA: Diagnosis not present

## 2023-05-27 DIAGNOSIS — J690 Pneumonitis due to inhalation of food and vomit: Secondary | ICD-10-CM | POA: Diagnosis not present

## 2023-05-27 LAB — BASIC METABOLIC PANEL WITH GFR
Anion gap: 12 (ref 5–15)
BUN: 71 mg/dL — ABNORMAL HIGH (ref 8–23)
CO2: 29 mmol/L (ref 22–32)
Calcium: 8.3 mg/dL — ABNORMAL LOW (ref 8.9–10.3)
Chloride: 87 mmol/L — ABNORMAL LOW (ref 98–111)
Creatinine, Ser: 3.09 mg/dL — ABNORMAL HIGH (ref 0.44–1.00)
GFR, Estimated: 14 mL/min — ABNORMAL LOW
Glucose, Bld: 245 mg/dL — ABNORMAL HIGH (ref 70–99)
Potassium: 4.3 mmol/L (ref 3.5–5.1)
Sodium: 128 mmol/L — ABNORMAL LOW (ref 135–145)

## 2023-05-27 LAB — MAGNESIUM: Magnesium: 2.2 mg/dL (ref 1.7–2.4)

## 2023-05-27 LAB — GLUCOSE, CAPILLARY
Glucose-Capillary: 225 mg/dL — ABNORMAL HIGH (ref 70–99)
Glucose-Capillary: 260 mg/dL — ABNORMAL HIGH (ref 70–99)
Glucose-Capillary: 239 mg/dL — ABNORMAL HIGH (ref 70–99)
Glucose-Capillary: 281 mg/dL — ABNORMAL HIGH (ref 70–99)

## 2023-05-27 LAB — SEDIMENTATION RATE: Sed Rate: 85 mm/h — ABNORMAL HIGH (ref 0–22)

## 2023-05-27 LAB — TROPONIN I (HIGH SENSITIVITY): Troponin I (High Sensitivity): 16 ng/L (ref <18)

## 2023-05-27 MED ORDER — IPRATROPIUM-ALBUTEROL 0.5-2.5 (3) MG/3ML IN SOLN
3.0000 mL | Freq: Two times a day (BID) | RESPIRATORY_TRACT | Status: DC
  Administered 2023-05-27 – 2023-06-03 (×13): 3 mL via RESPIRATORY_TRACT
  Filled 2023-05-27 (×14): qty 3

## 2023-05-27 MED ORDER — FLUTICASONE PROPIONATE 50 MCG/ACT NA SUSP
2.0000 | Freq: Two times a day (BID) | NASAL | Status: DC
  Administered 2023-05-27 – 2023-06-06 (×20): 2 via NASAL
  Filled 2023-05-27: qty 16

## 2023-05-27 NOTE — NC FL2 (Signed)
 [place]  MEDICAID FL2 LEVEL OF CARE FORM     IDENTIFICATION  Patient Name: Mary Mccarthy Birthdate: 16-Jan-1941 Sex: female Admission Date (Current Location): 05/14/2023  [facility] and [place] Number:  Producer, television/film/video and Address:  [address] [facility], [address], [place], [place] [address]      Provider Number: [id number]  Attending Physician Name and Address:  Albertus Alt,*  Relative Name and Phone Number:  Son: Rosemarie Conquest    Current Level of Care: SNF Recommended Level of Care: Skilled Nursing Facility Prior Approval Number:    Date Approved/Denied: 05/27/23 PASRR Number: [id number]  Discharge Plan:      Current Diagnoses: Patient Active Problem List   Diagnosis Date Noted   Aspiration pneumonia (HCC) 05/23/2023   Persistent atrial fibrillation (HCC) 05/15/2023   Elevated troponin level not due myocardial infarction 05/15/2023   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) 05/15/2023   Mixed hyperlipidemia 05/15/2023   Demand ischemia (HCC) 05/15/2023   Nonobstructive atherosclerosis of coronary artery 05/15/2023   Acute on chronic diastolic (congestive) heart failure (HCC) 05/14/2023   Hypertensive urgency 05/14/2023   Elevated troponin 05/14/2023   CKD (chronic kidney disease), stage IV (HCC) 05/14/2023   CKD (chronic kidney disease) stage 3, GFR 30-59 ml/min (HCC) 03/18/2023   Acute on chronic diastolic CHF (congestive heart failure) (HCC) 02/28/2023   Urinary frequency 03/29/2022   UTI (urinary tract infection) 03/29/2022   Hypothyroidism 05/28/2021   Vitamin D  deficiency 04/20/2021   Abnormal TSH 04/20/2021   Encounter for well adult exam with abnormal findings 02/16/2021   Long term current use of antiarrhythmic drug 11/28/2020   Chronic respiratory failure (HCC) 11/22/2020   Dehydration 11/02/2020   Type 2 diabetes mellitus with hyperlipidemia (HCC) 11/02/2020   CKD (chronic  kidney disease), stage III (HCC) 11/01/2020   Transient post TAVR CHB -> not seen on follow-up Zio patch 10/28/2020   S/P TAVR (transcatheter aortic valve replacement) 10/26/2020   Melena 10/20/2020   Normocytic anemia 10/19/2020   Acute bronchitis 09/21/2020   Paroxysmal atrial fibrillation (HCC) 08/23/2020   Hypercoagulable state due to atrial fibrillation (HCC) 08/23/2020   PAF with RVR: CHA2DS2-VASc 9.  On amiodarone . 08/11/2020   History of CVA (cerebrovascular accident) 08/10/2020   Hypomagnesemia 08/10/2020   Severe aortic stenosis 08/2020   Aortic atherosclerosis (HCC) 03/18/2020   B12 deficiency 09/21/2019   Cough 03/14/2018   Wheezing 03/14/2018   Anxiety 09/11/2017   Osteoporosis 03/10/2016   Coronary artery disease, non-occlusive 05/24/2015   GI bleed    Pulmonary hypertension (HCC)    CHF (congestive heart failure), NYHA class II, chronic, diastolic (HCC) 05/20/2015   Gastric ulceration    AVM (arteriovenous malformation) of colon    Demand ischemia of myocardium (HCC)    Right knee pain 05/21/2014   Anemia, iron  deficiency 05/20/2013   Menopausal and postmenopausal disorder 12/07/2009   INSOMNIA 09/24/2008   Allergic rhinitis 06/24/2007   GERD 06/24/2007   History of colonic polyps 06/24/2007   Hyperlipidemia associated with type 2 diabetes mellitus (HCC) 10/08/2006   Anxiety with depression 10/08/2006   Essential hypertension 10/08/2006   Peptic ulcer 10/08/2006   Type 2 diabetes mellitus with hyperglycemia, without long-term current use of insulin  (HCC) 10/05/2006   Morbid obesity (HCC) 10/05/2006    Orientation RESPIRATION BLADDER Height & Weight     Self, Time, Situation, Place  O2 Incontinent Weight: 180 lb 12.4 oz (82 kg) Height:  5\' 1"  (154.9 cm)  BEHAVIORAL SYMPTOMS/MOOD NEUROLOGICAL BOWEL NUTRITION STATUS      Continent Diet  AMBULATORY STATUS COMMUNICATION OF NEEDS Skin   Limited Assist Verbally                         Personal Care  Assistance Level of Assistance  Dressing, Bathing Bathing Assistance: Limited assistance   Dressing Assistance: Limited assistance     Functional Limitations Info  Hearing, Sight Sight Info: Adequate Hearing Info: Impaired      SPECIAL CARE FACTORS FREQUENCY  PT (By licensed PT), OT (By licensed OT)     PT Frequency: 5x OT Frequency: 3x            Contractures Contractures Info: Not present    Additional Factors Info  Code Status Code Status Info: DNR             Current Medications (05/27/2023):  This is the current hospital active medication list Current Facility-Administered Medications  Medication Dose Route Frequency Provider Last Rate Last Admin   acetaminophen  (TYLENOL ) tablet 650 mg  650 mg Oral Q4H PRN Smith, Rondell A, MD   650 mg at 05/26/23 0534   albuterol  (PROVENTIL ) (2.5 MG/3ML) 0.083% nebulizer solution 2.5 mg  2.5 mg Nebulization Q2H PRN Arrien, Mauricio Daniel, MD       ALPRAZolam  (XANAX ) tablet 0.25 mg  0.25 mg Oral TID PRN Smith, Rondell A, MD   0.25 mg at 05/24/23 1430   atorvastatin  (LIPITOR ) tablet 40 mg  40 mg Oral Daily Smith, Rondell A, MD   40 mg at 05/27/23 1053   enoxaparin  (LOVENOX ) injection 30 mg  30 mg Subcutaneous Q24H Smith, Rondell A, MD   30 mg at 05/26/23 1644   feeding supplement (ENSURE ENLIVE / ENSURE PLUS) liquid 237 mL  237 mL Oral BID BM Deforest Fast, MD   237 mL at 05/25/23 1101   fesoterodine  (TOVIAZ ) tablet 4 mg  4 mg Oral Daily Smith, Rondell A, MD   4 mg at 05/27/23 1055   fluticasone  (FLONASE ) 50 MCG/ACT nasal spray 2 spray  2 spray Each Nare BID Arrien, Curlee Doss, MD   2 spray at 05/27/23 1051   guaiFENesin -dextromethorphan  (ROBITUSSIN DM) 100-10 MG/5ML syrup 15 mL  15 mL Oral Q4H PRN Arrien, Curlee Doss, MD   15 mL at 05/27/23 1057   hydrALAZINE  (APRESOLINE ) tablet 75 mg  75 mg Oral Q8H Chandrasekhar, Mahesh A, MD   75 mg at 05/27/23 0505   insulin  aspart (novoLOG ) injection 0-5 Units  0-5 Units  Subcutaneous QHS Opyd, Timothy S, MD   2 Units at 05/26/23 2117   insulin  aspart (novoLOG ) injection 0-6 Units  0-6 Units Subcutaneous TID WC Opyd, Timothy S, MD   2 Units at 05/27/23 0636   insulin  glargine-yfgn (SEMGLEE ) injection 20 Units  20 Units Subcutaneous Daily Joseph, Preetha, MD   20 Units at 05/27/23 1056   ipratropium-albuterol  (DUONEB) 0.5-2.5 (3) MG/3ML nebulizer solution 3 mL  3 mL Nebulization BID Arrien, Curlee Doss, MD       isosorbide  mononitrate (IMDUR ) 24 hr tablet 60 mg  60 mg Oral Daily Tolia, Sunit, DO   60 mg at 05/27/23 1053   lactulose  (CHRONULAC ) 10 GM/15ML solution 20 g  20 g Oral BID Joseph, Preetha, MD   20 g at 05/26/23 2117   levothyroxine  (SYNTHROID ) tablet 25 mcg  25 mcg Oral Daily Smith, Rondell A, MD   25 mcg at 05/27/23 0505  methylPREDNISolone  sodium succinate (SOLU-MEDROL ) 125 mg/2 mL injection 60 mg  60 mg Intravenous Q12H Arrien, Curlee Doss, MD   60 mg at 05/27/23 1610   metoprolol  succinate (TOPROL -XL) 24 hr tablet 12.5 mg  12.5 mg Oral Daily Smith, Rondell A, MD   12.5 mg at 05/27/23 1053   mirtazapine  (REMERON ) tablet 15 mg  15 mg Oral QHS Smith, Rondell A, MD   15 mg at 05/26/23 2117   pantoprazole  (PROTONIX ) EC tablet 40 mg  40 mg Oral BID Smith, Rondell A, MD   40 mg at 05/27/23 1053   sodium chloride  flush (NS) 0.9 % injection 3 mL  3 mL Intravenous Q12H Smith, Rondell A, MD   3 mL at 05/26/23 2118   sodium chloride  flush (NS) 0.9 % injection 3 mL  3 mL Intravenous PRN Manny Sees A, MD       trimethobenzamide  (TIGAN ) injection 200 mg  200 mg Intramuscular Q6H PRN Smith, Rondell A, MD         Discharge Medications: Please see discharge summary for a list of discharge medications.  Relevant Imaging Results:  Relevant Lab Results:   Additional Information SS#: [id number]  Hilda Lovings, LCSW

## 2023-05-27 NOTE — TOC Initial Note (Addendum)
 Transition of Care [facility]) - Initial/Assessment Note    Patient Details  Name: Mary Mccarthy MRN: [id number] Date of Birth: [birth year]  Transition [facility]) CM/SW Contact:    Hilda Lovings, LCSW Phone Number: 05/27/2023, 12:09 PM  Clinical Narrative:                 CSW followed-up disposition recommendations (SNF placement).  CSW completed initial TOC work-up/assessment as noted by the following below.   CSW spoke with the patient to review SNF referral process per clinical recommendations and assessed the pt's SNF preference.   The patient expressed no snf preference.   The CSW attempted to contact the patient's natural support (Son- Normand Beckwith [phone]) to update them to the patient's disposition. CSW was unable to make contact with the natural support. CSW unable to reach the son, due to him not picking up phone call attempts.    CSW referral efforts to support the patient's disposition FL2:  PASRR:  SNF referrals:   TOC Disposition follow-up needs  Please provide the patient or natural support with bed-offer updates.  Please continue with SNF placement efforts. Please update the clinical team to SNF placement efforts:  No other needs identified by this Clinical research associate currently. Patient needs and current disposition to be followed by    Expected Discharge Plan: Skilled Nursing Facility Barriers to Discharge: Continued Medical Work up   Patient Goals and CMS Choice Patient states their goals for this hospitalization and ongoing recovery are:: to get better and go home   Choice offered to / list presented to : Patient, Adult Children [place] ownership interest in [facility].provided to:: Adult Children    Expected Discharge Plan and Services     Post Acute Care Choice: Skilled Nursing Facility Living arrangements for the past 2 months: Single Family Home                                      Prior Living  Arrangements/Services Living arrangements for the past 2 months: Single Family Home   Patient language and need for interpreter reviewed:: Yes Do you feel safe going back to the place where you live?: Yes      Need for Family Participation in Patient Care: Yes (Comment) Care giver support system in place?: Yes (comment)   Criminal Activity/Legal Involvement Pertinent to Current Situation/Hospitalization: No - Comment as needed  Activities of Daily Living   ADL Screening (condition at time of admission) Independently performs ADLs?: No Does the patient have a NEW difficulty with bathing/dressing/toileting/self-feeding that is expected to last >3 days?: Yes (Initiates electronic notice to provider for possible OT consult) Does the patient have a NEW difficulty with getting in/out of bed, walking, or climbing stairs that is expected to last >3 days?: Yes (Initiates electronic notice to provider for possible PT consult) Does the patient have a NEW difficulty with communication that is expected to last >3 days?: No Is the patient deaf or have difficulty hearing?: Yes Does the patient have difficulty seeing, even when wearing glasses/contacts?: Yes Does the patient have difficulty concentrating, remembering, or making decisions?: Yes  Permission Sought/Granted Permission sought to share information with : Case Manager Permission granted to share information with : Yes, Verbal Permission Granted        Permission granted to share info w Relationship: Son: Allayne Arabian simmons     Emotional Assessment  Orientation: : Oriented to Self, Oriented to Place, Oriented to  Time, Oriented to Situation Alcohol / Substance Use: Not Applicable Psych Involvement: No (comment)  Admission diagnosis:  SOB (shortness of breath) [R06.02] Hypoxia [R09.02] Acute on chronic diastolic CHF (congestive heart failure) (HCC) [I50.33] Hypervolemia, unspecified hypervolemia type [E87.70] Acute on chronic  diastolic (congestive) heart failure (HCC) [I50.33] Patient Active Problem List   Diagnosis Date Noted   Aspiration pneumonia (HCC) 05/23/2023   Persistent atrial fibrillation (HCC) 05/15/2023   Elevated troponin level not due myocardial infarction 05/15/2023   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) 05/15/2023   Mixed hyperlipidemia 05/15/2023   Demand ischemia (HCC) 05/15/2023   Nonobstructive atherosclerosis of coronary artery 05/15/2023   Acute on chronic diastolic (congestive) heart failure (HCC) 05/14/2023   Hypertensive urgency 05/14/2023   Elevated troponin 05/14/2023   CKD (chronic kidney disease), stage IV (HCC) 05/14/2023   CKD (chronic kidney disease) stage 3, GFR 30-59 ml/min (HCC) 03/18/2023   Acute on chronic diastolic CHF (congestive heart failure) (HCC) 02/28/2023   Urinary frequency 03/29/2022   UTI (urinary tract infection) 03/29/2022   Hypothyroidism 05/28/2021   Vitamin D  deficiency 04/20/2021   Abnormal TSH 04/20/2021   Encounter for well adult exam with abnormal findings 02/16/2021   Long term current use of antiarrhythmic drug 11/28/2020   Chronic respiratory failure (HCC) 11/22/2020   Dehydration 11/02/2020   Type 2 diabetes mellitus with hyperlipidemia (HCC) 11/02/2020   CKD (chronic kidney disease), stage III (HCC) 11/01/2020   Transient post TAVR CHB -> not seen on follow-up Zio patch 10/28/2020   S/P TAVR (transcatheter aortic valve replacement) 10/26/2020   Melena 10/20/2020   Normocytic anemia 10/19/2020   Acute bronchitis 09/21/2020   Paroxysmal atrial fibrillation (HCC) 08/23/2020   Hypercoagulable state due to atrial fibrillation (HCC) 08/23/2020   PAF with RVR: CHA2DS2-VASc 9.  On amiodarone . 08/11/2020   History of CVA (cerebrovascular accident) 08/10/2020   Hypomagnesemia 08/10/2020   Severe aortic stenosis 08/2020   Aortic atherosclerosis (HCC) 03/18/2020   B12 deficiency 09/21/2019   Cough 03/14/2018    Wheezing 03/14/2018   Anxiety 09/11/2017   Osteoporosis 03/10/2016   Coronary artery disease, non-occlusive 05/24/2015   GI bleed    Pulmonary hypertension (HCC)    CHF (congestive heart failure), NYHA class II, chronic, diastolic (HCC) 05/20/2015   Gastric ulceration    AVM (arteriovenous malformation) of colon    Demand ischemia of myocardium (HCC)    Right knee pain 05/21/2014   Anemia, iron  deficiency 05/20/2013   Menopausal and postmenopausal disorder 12/07/2009   INSOMNIA 09/24/2008   Allergic rhinitis 06/24/2007   GERD 06/24/2007   History of colonic polyps 06/24/2007   Hyperlipidemia associated with type 2 diabetes mellitus (HCC) 10/08/2006   Anxiety with depression 10/08/2006   Essential hypertension 10/08/2006   Peptic ulcer 10/08/2006   Type 2 diabetes mellitus with hyperglycemia, without long-term current use of insulin  (HCC) 10/05/2006   Morbid obesity (HCC) 10/05/2006   PCP:  Roslyn Coombe, MD Pharmacy:   [facility] [phone] - [place], [place] - [address] [facility] [address] [place] [address] Phone: [phone] Fax: [phone]  [facility] [address] [place] [address] Phone: [phone] Fax: [phone]     Social Drivers of Health (SDOH) Social History: SDOH Screenings   Food Insecurity: No Food Insecurity (05/14/2023)  Recent Concern: Food Insecurity - Food Insecurity Present (  03/01/2023)  Housing: Low Risk  (05/17/2023)  Recent Concern: Housing - High Risk (03/05/2023)  Transportation Needs: No Transportation Needs (05/17/2023)  Utilities: At Risk (05/14/2023)  Alcohol Screen: Low Risk  (05/17/2023)  Depression (PHQ2-9): Low Risk  (03/14/2023)  Financial Resource Strain: Low Risk  (05/17/2023)  Physical Activity: Inactive (05/11/2022)  Social Connections: Socially Isolated (05/14/2023)  Stress: No Stress Concern Present (05/11/2022)  Tobacco Use: Low Risk  (05/14/2023)    SDOH Interventions: Housing Interventions: Intervention Not Indicated Transportation Interventions: Intervention Not Indicated Utilities Interventions: Inpatient TOC, Community Resources Provided Alcohol Usage Interventions: Intervention Not Indicated (Score <7) Financial Strain Interventions: Intervention Not Indicated Social Connections Interventions: Inpatient TOC, Community Resources Provided   Readmission Risk Interventions    11/04/2020   11:24 AM  Readmission Risk Prevention Plan  Transportation Screening Complete  PCP or Specialist Appt within 3-5 Days Complete  HRI or Home Care Consult Complete  Social Work Consult for Recovery Care Planning/Counseling Not Complete  Palliative Care Screening Not Applicable  Medication Review Oceanographer) Complete

## 2023-05-27 NOTE — Plan of Care (Signed)
  Problem: Nutrition: Goal: Adequate nutrition will be maintained Outcome: Completed/Met   Problem: Elimination: Goal: Will not experience complications related to bowel motility Outcome: Completed/Met Goal: Will not experience complications related to urinary retention Outcome: Completed/Met   Problem: Pain Managment: Goal: General experience of comfort will improve and/or be controlled Outcome: Completed/Met   Problem: Nutritional: Goal: Maintenance of adequate nutrition will improve Outcome: Completed/Met

## 2023-05-27 NOTE — Progress Notes (Signed)
 Progress Note   Patient: Mary Mccarthy MWU:[id number] DOB: Dec 30, 1940 DOA: 05/14/2023     13 DOS: the patient was seen and examined on 05/27/2023   Brief hospital course: Mary Mccarthy was admitted to the hospital with the working diagnosis of heart failure exacerbation.  83 yo female with the past medical history of hypertension, hyperlipidemia, paroxysmal atrial fibrillation, aortic stenosis sp TAVR, T2DM and obesity who presented with dyspnea and lower extremity edema. Reported 2 weeks of worsening symptoms, along with weight gain. Her dyspnea and edema were persistent despite increasing the dose of her oral furosemide  at home.  At the time of her initial physical examination her 02 saturation was 75% on room air, blood pressure 196/70, HR 62, RR 30.  Lungs with decreased breath sounds and rales bilaterally, up to mid zones, heart with S1 and S2 present and regular, abdomen with no distention and positive bilateral lower extremity, ++ pitting edema.   Na 135, K 4,1 Cl 100, bicarbonate 25 glucose 109, bun 24 cr 1,98  BNP 977 High sensitive troponin 35 and 31  Lactic acid 2,0 and 1,0  Wbc 5,2 hgb 8,1 plt 223  TSH 5,6  Sars covid 19 negative  Influenza negative  RSV negative  Urine analysis SG 1,014, protein 30, negative leukocytes and negative hgb   Chest radiograph with hypoinflation, positive cardiomegaly, bilateral hilar vascular congestion with cephalization of the vasculature.   EKG 62 bpm, right axis deviation, qtc 494, sinus rhythm with poor R R wave progression with no significant ST segment or T wave changes.   Patient was placed on aggressive diuresis.  04/13 developed aspiration pneumonia and was placed on antibiotic therapy.  04/19 placed on steroids for organizing pneumonia and continue IV furosemide  for diuresis. Improving 02 requirements.  04/10 continue to decrease 02 requirements. Holding furosemide  due to rise in serum cr.   Assessment and Plan: * Acute on  chronic diastolic CHF (congestive heart failure) (HCC) Echocardiogram with preserved LV systolic function with EF 60 to 65%, severe LVH, RV systolic function preserved, RVSP 53.7 mmHg, LA and RA with mild dilatation, mild mitral stenosis, mild to moderate tricuspid valve regurgitation, sp TAVR with mild paravalvular leak   Documented urine output 525 ml, since admission negative fluid balance - 9,214 ml.   Afterload reduction with hydralazine  and isosorbide .  B blocker with metoprolol .  Holding furosemide  due to rising serum cr  Limited pharmacologic therapy due to reduced GFR.   Acute on chronic hypoxemic respiratory failure due to acute cardiogenic pulmonary edema. 04/17 Chest radiograph with bilateral interstitial infiltrates, with mild improvement on the right side.  Possible organizing pneumonia, possible amiodarone  toxicity discontinued amiodarone  Continue with systemic corticosteroids and bronchodilators.  Continue to decrease 02 requirements, today 02 saturation is 95% on 4 L/min per [place]   Paroxysmal atrial fibrillation (HCC) Continue rate control with metoprolol .  Off amiodarone  due to possible lung toxicity  Patient not on anticoagulation.   Coronary artery disease, non-occlusive Continue atorvastatin  and blood pressure control   Hypertensive urgency Continue blood pressure control with metoprolol , hydralazine  and isosorbide .   CKD (chronic kidney disease), stage IV (HCC) Hyponatremia. AKI  Renal function today with serum cr at  3,0 with K at 4,3 and serum bicarbonate at 29  Na 128 and Mg 2.2   Continue to hold on furosemide  and follow up renal function in am.  Avoid hypotension and nephrotoxic medications   Type 2 diabetes mellitus with hyperlipidemia (HCC) Continue insulin  sliding scale for glucose cover  and monitoring  Basal insulin  20 units. Uncontrolled hyperglycemia with fasting glucose today 245 mg.dl.   Continue statin   Anxiety with depression Continue  mirtazapine  and alprazolam .   Hypothyroidism Continue with levothyroxine    Normocytic anemia Cell count has been stable.   Aspiration pneumonia (HCC) 04/13 radiograph with multiple patchy bilateral infiltrates.  Patient afebrile and no leukocytosis  Continue with po Augmentin   Continue airway clearing techniques with flutter valve and incentive spirometer.   Possible organizing pneumonia, holding amiodarone  and on high dose systemic corticosteroids.  Follow up with CT chest        Subjective: Patient with no chest pain, dyspnea has been improving, no PND or orthopnea   Physical Exam: Vitals:   05/27/23 0500 05/27/23 0758 05/27/23 0803 05/27/23 1128  BP:  (!) 157/52  (!) 130/44  Pulse:  64 63 66  Resp:  (!) 22 19 17   Temp:  (!) 97.5 F (36.4 C)  97.7 F (36.5 C)  TempSrc:  Oral  Oral  SpO2:  93% 94% 93%  Weight: 82 kg     Height:       Neurology awake and alert ENT with mild pallor Cardiovascular with S1 and S2 present, regular with no gallops, positive systolic murmur at the right lower sternal border No JVD No lower extremity edema Respiratory with scattered rales on anterior auscultation, no wheezing or rhonchi  Abdomen with no distention   Data Reviewed:    Family Communication: I spoke with patient's son at the bedside, we talked in detail about patient's condition, plan of care and prognosis and all questions were addressed.   Disposition: Status is: Inpatient Remains inpatient appropriate because: respiratory failure   Planned Discharge Destination: Skilled nursing facility     Author: Albertus Alt, MD 05/27/2023 2:56 PM  For on call review [email or web address].

## 2023-05-27 NOTE — Progress Notes (Signed)
 Cardiology Progress Note  Patient ID: Mary Mccarthy MRN: [id number] DOB: 1940/05/11 Date of Encounter: 05/27/2023 Primary Cardiologist: Randene Bustard, MD  Subjective   Chief Complaint: SOB  HPI: Reports chest pain overnight.  Chest x-ray with persistent pulmonary edema.  Still volume up.  Serum creatinine is rising.  ROS:  All other ROS reviewed and negative. Pertinent positives noted in the HPI.     Telemetry  Overnight telemetry shows sinus rhythm 70s, which I personally reviewed.   ECG  The most recent ECG shows sinus rhythm heart 64, LVH by voltage, which I personally reviewed.   Physical Exam   Vitals:   05/27/23 0334 05/27/23 0500 05/27/23 0758 05/27/23 0803  BP: (!) 134/44  (!) 157/52   Pulse: 66  64 63  Resp: 16  (!) 22 19  Temp: 97.6 F (36.4 C)  (!) 97.5 F (36.4 C)   TempSrc: Oral  Oral   SpO2: 96%  93% 94%  Weight:  82 kg    Height:        Intake/Output Summary (Last 24 hours) at 05/27/2023 0923 Last data filed at 05/26/2023 2359 Gross per 24 hour  Intake 420 ml  Output 525 ml  Net -105 ml       05/27/2023    5:00 AM 05/26/2023    4:56 AM 05/25/2023    5:00 AM  Last 3 Weights  Weight (lbs) 180 lb 12.4 oz 180 lb 8.9 oz 174 lb 13.2 oz  Weight (kg) 82 kg 81.9 kg 79.3 kg    Body mass index is 34.16 kg/m.  General: Well nourished, well developed, in no acute distress Head: Atraumatic, normal size  Eyes: PEERLA, EOMI  Neck: Supple, JVD 12 to 15 cm water Endocrine: No thryomegaly Cardiac: Normal S1, S2; RRR; no murmurs, rubs, or gallops Lungs: Diminished breath sounds bilaterally, crackles noted Abd: Soft, nontender, no hepatomegaly  Ext: No edema, pulses 2+ Musculoskeletal: No deformities, BUE and BLE strength normal and equal Skin: Warm and dry, no rashes   Neuro: Alert and oriented to person, place, time, and situation, CNII-XII grossly intact, no focal deficits  Psych: Normal mood and affect   Cardiac Studies  TTE 05/15/2023  1. Left  ventricular ejection fraction, by estimation, is 60 to 65%. The  left ventricle has normal function. The left ventricle has no regional  wall motion abnormalities. There is severe asymmetric left ventricular  hypertrophy of the septal segment. Left   ventricular diastolic function could not be evaluated.   2. Right ventricular systolic function is normal. The right ventricular  size is mildly enlarged. There is moderately elevated pulmonary artery  systolic pressure. The estimated right ventricular systolic pressure is  53.7 mmHg.   3. Left atrial size was mildly dilated.   4. Right atrial size was mildly dilated.   5. The mitral valve is degenerative. Trivial mitral valve regurgitation.  Mild mitral stenosis. The mean mitral valve gradient is 3.0 mmHg. Severe  mitral annular calcification.   6. Tricuspid valve regurgitation is mild to moderate.   7. Mild paravalvular leak adjacent to the intervalvular fibrosa. The  aortic valve has been repaired/replaced. Aortic valve regurgitation is  mild. No aortic stenosis is present. There is a 26 mm [employer] stented  (TAVR) valve present in the aortic  position. Procedure Date: 10/26/2020. Echo findings are consistent with  normal structure and function of the aortic valve prosthesis. Echo  findings are consistent with perivalvular leak of the aortic prosthesis.  Aortic regurgitation  PHT measures 514 msec.   Aortic valve mean gradient measures 9.0 mmHg. Aortic valve Vmax measures  1.88 m/s.   8. The inferior vena cava is dilated in size with <50% respiratory  variability, suggesting right atrial pressure of 15 mmHg.   Patient Profile  Mary Mccarthy is a 83 y.o. female with persistent atrial fibrillation, HFpEF, aortic stenosis status post TAVR, diabetes, obesity who was admitted on 05/14/2023 for acute on chronic diastolic heart failure.   Assessment & Plan   # Acute on chronic diastolic heart failure - Remains volume up.  Gave IV  diuresis but creatinine has remained elevated.  Hold further IV diuresis until creatinine comes down. - Chest x-ray with persistent pulmonary edema on my review.  Seems to be holding fluid centrally. - We will see when we can reinitiate IV diuresis.  Hold today.  # Acute hypoxic respiratory failure # Possible organizing pneumonia # Possible amiodarone  lung toxicity # Acute diastolic heart failure Really difficult to sort out her-lung imaging.  We will proceed with a chest CT noncontrast today.  Unclear if this is fluid versus Amio toxicity versus organizing pneumonia. Check ESR.   # CKD 3b/IV # AKI - Holding diuresis today.  # Persistent atrial fibrillation - Amiodarone  on hold due to concern for possible lung toxicity.  Chest CT and ESR ordered today.  Continue beta-blocker.  Not on oral anticoagulation due to history of bleeding.  # HTN - Continue home metoprolol , Imdur  and hydralazine   # Nonobstructive CAD - Continue home statin.  Not on aspirin .  # Aortic stenosis status post TAVR - Stable       For questions or updates, please contact [facility] Please consult [email or web address] for contact info under     Signed, Melodee Spruce T. Rolm Clos, MD, [facility]  [facility]  05/27/2023 9:23 AM

## 2023-05-27 NOTE — Progress Notes (Signed)
 BBS dec'd but essn clear. Change nebs to BID per RT assessment

## 2023-05-28 ENCOUNTER — Telehealth: Payer: Self-pay | Admitting: Pulmonary Disease

## 2023-05-28 DIAGNOSIS — I160 Hypertensive urgency: Secondary | ICD-10-CM | POA: Diagnosis not present

## 2023-05-28 DIAGNOSIS — I2510 Atherosclerotic heart disease of native coronary artery without angina pectoris: Secondary | ICD-10-CM | POA: Diagnosis not present

## 2023-05-28 DIAGNOSIS — I480 Paroxysmal atrial fibrillation: Secondary | ICD-10-CM | POA: Diagnosis not present

## 2023-05-28 DIAGNOSIS — T462X1A Poisoning by other antidysrhythmic drugs, accidental (unintentional), initial encounter: Secondary | ICD-10-CM | POA: Diagnosis not present

## 2023-05-28 DIAGNOSIS — J9621 Acute and chronic respiratory failure with hypoxia: Secondary | ICD-10-CM | POA: Diagnosis not present

## 2023-05-28 DIAGNOSIS — I5033 Acute on chronic diastolic (congestive) heart failure: Secondary | ICD-10-CM | POA: Diagnosis not present

## 2023-05-28 DIAGNOSIS — N184 Chronic kidney disease, stage 4 (severe): Secondary | ICD-10-CM | POA: Diagnosis not present

## 2023-05-28 DIAGNOSIS — I4819 Other persistent atrial fibrillation: Secondary | ICD-10-CM | POA: Diagnosis not present

## 2023-05-28 LAB — BASIC METABOLIC PANEL WITH GFR
Anion gap: 11 (ref 5–15)
BUN: 80 mg/dL — ABNORMAL HIGH (ref 8–23)
CO2: 30 mmol/L (ref 22–32)
Calcium: 8.8 mg/dL — ABNORMAL LOW (ref 8.9–10.3)
Chloride: 91 mmol/L — ABNORMAL LOW (ref 98–111)
Creatinine, Ser: 3.05 mg/dL — ABNORMAL HIGH (ref 0.44–1.00)
GFR, Estimated: 15 mL/min — ABNORMAL LOW (ref >60)
Glucose, Bld: 232 mg/dL — ABNORMAL HIGH (ref 70–99)
Potassium: 4.4 mmol/L (ref 3.5–5.1)
Sodium: 132 mmol/L — ABNORMAL LOW (ref 135–145)

## 2023-05-28 LAB — GLUCOSE, CAPILLARY
Glucose-Capillary: 203 mg/dL — ABNORMAL HIGH (ref 70–99)
Glucose-Capillary: 295 mg/dL — ABNORMAL HIGH (ref 70–99)
Glucose-Capillary: 304 mg/dL — ABNORMAL HIGH (ref 70–99)
Glucose-Capillary: 216 mg/dL — ABNORMAL HIGH (ref 70–99)

## 2023-05-28 LAB — MAGNESIUM: Magnesium: 2.4 mg/dL (ref 1.7–2.4)

## 2023-05-28 MED ORDER — AMLODIPINE BESYLATE 2.5 MG PO TABS
2.5000 mg | ORAL_TABLET | Freq: Every day | ORAL | Status: DC
  Administered 2023-05-29 – 2023-06-04 (×7): 2.5 mg via ORAL
  Filled 2023-05-28 (×8): qty 1

## 2023-05-28 MED ORDER — INSULIN ASPART 100 UNIT/ML IJ SOLN
0.0000 [IU] | Freq: Three times a day (TID) | INTRAMUSCULAR | Status: DC
  Administered 2023-05-28 – 2023-05-29 (×2): 11 [IU] via SUBCUTANEOUS
  Administered 2023-05-29 – 2023-05-30 (×4): 5 [IU] via SUBCUTANEOUS
  Administered 2023-05-30: 2 [IU] via SUBCUTANEOUS
  Administered 2023-05-31 (×2): 3 [IU] via SUBCUTANEOUS
  Administered 2023-05-31: 2 [IU] via SUBCUTANEOUS
  Administered 2023-06-01: 5 [IU] via SUBCUTANEOUS
  Administered 2023-06-01 (×2): 2 [IU] via SUBCUTANEOUS
  Administered 2023-06-02 (×2): 5 [IU] via SUBCUTANEOUS
  Administered 2023-06-02 – 2023-06-03 (×2): 2 [IU] via SUBCUTANEOUS
  Administered 2023-06-03: 3 [IU] via SUBCUTANEOUS
  Administered 2023-06-04: 2 [IU] via SUBCUTANEOUS
  Administered 2023-06-04: 3 [IU] via SUBCUTANEOUS
  Administered 2023-06-04: 2 [IU] via SUBCUTANEOUS
  Administered 2023-06-05 (×3): 3 [IU] via SUBCUTANEOUS
  Administered 2023-06-06: 2 [IU] via SUBCUTANEOUS
  Administered 2023-06-06 (×2): 3 [IU] via SUBCUTANEOUS
  Administered 2023-06-07 (×2): 2 [IU] via SUBCUTANEOUS

## 2023-05-28 NOTE — Consult Note (Signed)
 NAME:  Glender Augusta, MRN:  [id number], DOB:  September 10, 1940, LOS: 14 ADMISSION DATE:  05/14/2023, CONSULTATION DATE:  05/28/2023 REFERRING MD:  Dr. Arien - [facility] , CHIEF COMPLAINT: Abnormal CT  History of Present Illness:  Mary Mccarthy is a 83 year old female with past medical history significant for DM, HLD, paroxysmal atrial fibrillation not on anticoagulation due to GI bleed, severe aortic stenosis s/p TAVR CAD, type 2 diabetes and anemia who presented to the ED initially 4/7 for complaints of shortness of breath.  Given concerns for acute on chronic respiratory failure in the setting of decompensated heart failure patient was admitted per hospitalist.  During admission thus far patient has been aggressively diuresed and is currently net -10 L for admission thus far.  However she continues to remain oxygen  dependent at which time amiodarone  lung toxicity was considered and steroids were started.  Pulmonary consulted 4/21  On chart review it appears that amiodarone  was started August 2022.  Anticoagulation was discontinued due to GI bleeding. She reports shortness of breath ongoing for few months worse for 2 weeks Imaging review shows interstitial prominence on chest x-ray 07/2022 Worsening bilateral interstitial infiltrates that appear 02/28/2023 and continue to worsen on serial chest x-ray Until her presentation/7  Pertinent  Medical History  DM, HLD, paroxysmal atrial fibrillation not on anticoagulation due to GI bleed, severe aortic stenosis s/p TAVR CAD, type 2 diabetes and anemia   Significant Hospital Events: Including procedures, antibiotic start and stop dates in addition to other pertinent events   4/7 admitted with complaints of shortness of, breath, admitted for acute on chronic hypoxic respiratory failure secondary to decompensated CHF 4/8 cardiology consulted 4/9 Concerned for underlying COPD in acute exacerbation given heavy second hand smoking exposure  4/11  Diuretics held due to AKI  4/17 Steroids started for concern of amio lung toxocity  4/20 CT chest  concerning for multifocal pneumonia.   Interim History / Subjective:  Improving shortness of breath Down to 5 L nasal cannula Denies chest pain Reports cough with clear sputum Afebrile  Objective   Blood pressure (!) 153/49, pulse 64, temperature 97.6 F (36.4 C), temperature source Oral, resp. rate (!) 22, height 5\' 1"  (1.549 m), weight 81.7 kg, SpO2 90%.        Intake/Output Summary (Last 24 hours) at 05/28/2023 1506 Last data filed at 05/28/2023 1326 Gross per 24 hour  Intake 720 ml  Output 1200 ml  Net -480 ml   Filed Weights   05/26/23 0456 05/27/23 0500 05/28/23 0039  Weight: 81.9 kg 82 kg 81.7 kg    Examination: Gen. Pleasant, obese, in no distress, normal affect ENT - no pallor,icterus, no post nasal drip, class 2-3 airway Neck: No JVD, no thyromegaly, no carotid bruits Lungs: no use of accessory muscles, no dullness to percussion, bilateral halfway dry crackles, no rhonchi Cardiovascular: Rhythm regular, heart sounds  normal, no murmurs or gallops, no peripheral edema Abdomen: soft and non-tender, no hepatosplenomegaly, BS normal. Musculoskeletal: No deformities, no cyanosis or clubbing Neuro:  alert, non focal, no tremors   Resolved Hospital Problem list     Assessment & Plan:  Her presentation with gradually progressive shortness of breath and interstitial infiltrates 11 progressive over the past few months that is consistent with amiodarone  toxicity.  He had improvement after stopping amiodarone  and steroids over the last few days is also consistent. She does have underlying chronic respiratory failure and hypoxia was noted during cardiology office visit in February 2025  Acute  on chronic respiratory failure - Dialed down oxygen  and saturations per minute, try to maintain 92% and above.  Would not be surprised if she needs continuous oxygen  at discharge would  expect this to improve over weeks - SNF rehab is being considered for discharge disposition.  Amiodarone  lung toxicity -DC amiodarone  , use rate control strategy. can drop prednisone  to 40 mg daily and slow taper over 2 to 4 weeks if tolerated We will arrange for outpatient follow-up in 2 to 4 weeks to reassess oxygen  needs and repeat chest x-ray  Best Practice (right click and "Reselect all SmartList Selections" daily)    Code Status:  DNR Last date of multidisciplinary goals of care discussion [Per primary service]  Labs   CBC: Recent Labs  Lab 05/22/23 0330  WBC 8.7  HGB 8.3*  HCT 26.4*  MCV 90.7  PLT 167    Basic Metabolic Panel: Recent Labs  Lab 05/22/23 0330 05/23/23 0237 05/24/23 0244 05/25/23 0750 05/26/23 1425 05/27/23 0404 05/28/23 0335  NA 135   < > 135 130* 130* 128* 132*  K 4.0   < > 4.0 4.4 4.6 4.3 4.4  CL 93*   < > 92* 89* 90* 87* 91*  CO2 34*   < > 32 33* 31 29 30   GLUCOSE 112*   < > 103* 175* 225* 245* 232*  BUN 29*   < > 27* 32* 62* 71* 80*  CREATININE 1.50*   < > 1.76* 1.72* 3.04* 3.09* 3.05*  CALCIUM  8.7*   < > 8.8* 8.7* 8.6* 8.3* 8.8*  MG  --    < > 1.9 2.2 2.3 2.2 2.4  PHOS 3.0  --   --   --   --   --   --    < > = values in this interval not displayed.   GFR: Estimated Creatinine Clearance: 13.3 mL/min (A) (by C-G formula based on SCr of 3.05 mg/dL (H)). Recent Labs  Lab 05/22/23 0330  WBC 8.7    Liver Function Tests: Recent Labs  Lab 05/22/23 0330  ALBUMIN 2.0*   No results for input(s): "LIPASE", "AMYLASE" in the last 168 hours. No results for input(s): "AMMONIA" in the last 168 hours.  ABG    Component Value Date/Time   PHART 7.446 10/26/2020 0503   PCO2ART 40.6 10/26/2020 0503   PO2ART 84.1 10/26/2020 0503   HCO3 27.6 10/26/2020 0503   TCO2 28 10/26/2020 0750   O2SAT 96.8 10/26/2020 0503     Coagulation Profile: No results for input(s): "INR", "PROTIME" in the last 168 hours.  Cardiac Enzymes: No results for  input(s): "CKTOTAL", "CKMB", "CKMBINDEX", "TROPONINI" in the last 168 hours.  HbA1C: Hgb A1c MFr Bld  Date/Time Value Ref Range Status  03/14/2023 09:24 AM 6.5 4.6 - 6.5 % Final    Comment:    Glycemic Control Guidelines for People with Diabetes:Non Diabetic:  <6%Goal of Therapy: <7%Additional Action Suggested:  >8%   03/01/2023 02:41 AM 6.0 (H) 4.8 - 5.6 % Final    Comment:    (NOTE) Pre diabetes:          5.7%-6.4%  Diabetes:              >6.4%  Glycemic control for   <7.0% adults with diabetes     CBG: Recent Labs  Lab 05/27/23 1126 05/27/23 1612 05/27/23 2102 05/28/23 0616 05/28/23 1112  GLUCAP 260* 239* 281* 203* 295*    Review of Systems:   Shortness of breath  Cough with clear sputum No chest pain No fever, URI symptoms No pedal edema  Past Medical History:  She,  has a past medical history of ALLERGIC RHINITIS (06/24/2007), Anemia, AVM (arteriovenous malformation) of colon, Blood transfusion without reported diagnosis (05/2015), COLONIC POLYPS, HX OF (06/24/2007), Coronary artery disease, non-occlusive (05/2015), DEPRESSION (10/08/2006), DIABETES MELLITUS, TYPE II (10/05/2006), GERD (06/24/2007), History of CVA (cerebrovascular accident) (08/10/2020), HYPERLIPIDEMIA (10/08/2006), HYPERTENSION (10/08/2006), INSOMNIA (09/24/2008), Left knee DJD, Nonsustained paroxysmal ventricular tachycardia (HCC) (10/2018), Osteoporosis (03/10/2016), PAF (paroxysmal atrial fibrillation) (HCC), PAF with RVR: CHA2DS2-VASc 9.  On Eliquis  and amiodarone . (08/11/2020), PEPTIC ULCER DISEASE (10/08/2006), S/P TAVR (transcatheter aortic valve replacement) (10/26/2020), and Severe aortic stenosis (08/2020).   Surgical History:   Past Surgical History:  Procedure Laterality Date   APPENDECTOMY     BIOPSY  10/21/2020   Procedure: BIOPSY;  Surgeon: Daina Drum, MD;  Location: [facility] ENDOSCOPY;  Service: Gastroenterology;;   BREAST BIOPSY     CARDIAC CATHETERIZATION N/A 05/21/2015    Procedure: Left Heart Cath and Coronary Angiography;  Surgeon: Arleen Lacer, MD;  Location: [facility] INVASIVE CV LAB;  Service: Cardiovascular;: Ost RI 60%, Ost RCA 30%, dLAD tapers to small vessel w/ 40-50%. Mildly elevated LVEDP. Normal LV Fxn.   COLONOSCOPY N/A 05/20/2015   Procedure: COLONOSCOPY;  Surgeon: Danette Duos, MD;  Location: [facility] ENDOSCOPY;  Service: Gastroenterology;  Laterality: N/A;   CORONARY CA2+ SCORE / CARDIAC CT ANGIOGRAM  10/09/2019   Calcium  score 624.  82nd percentile. Dominant RCA: Mild (25-49%) proximal stenosis-distal bifurcation into PDA and PAV--< RPL branches.  LAD (1 major mid vessel diagonal) diffuse calcified plaque, mild proximal stenosis with minimal distal stenosis.  Small RI moderate ostial disease.  LCx-moderate mixed (50-69%) proximal stenosis.  Small dOM1 disease.  Trileaflet AoV, annular Ca2+ - probable AS   ESOPHAGOGASTRODUODENOSCOPY N/A 05/20/2015   Procedure: ESOPHAGOGASTRODUODENOSCOPY (EGD);  Surgeon: Danette Duos, MD;  Location: [facility] ENDOSCOPY;  Service: Gastroenterology;  Laterality: N/A;   ESOPHAGOGASTRODUODENOSCOPY (EGD) WITH PROPOFOL  N/A 10/21/2020   Procedure: ESOPHAGOGASTRODUODENOSCOPY (EGD) WITH PROPOFOL ;  Surgeon: Daina Drum, MD;  Location: [facility] ENDOSCOPY;  Service: Gastroenterology;  Laterality: N/A;   INTRAOPERATIVE TRANSTHORACIC ECHOCARDIOGRAM N/A 10/26/2020   Procedure: INTRAOPERATIVE TRANSTHORACIC ECHOCARDIOGRAM;  Surgeon: Abel Hoe; Location: [facility] OR; Pre-TAVR well-visualized calcified AoV mean Grad 37 mmHg, AVA 0.72 cm => Post TAVR well-positioned supra-annular 26 mm Medtronic Evolut Pro valve placed with no PVL.  Mean gradient 14 mmHg.  AVR 1.58 cm.  Normal flow to RCA and LM-LAD.  ; EF 60 to 65%.  Degenerative severe MAC w/ mild MR.   RIGHT/LEFT HEART CATH AND CORONARY ANGIOGRAPHY N/A 09/29/2020   Procedure: RIGHT/LEFT HEART CATH AND CORONARY ANGIOGRAPHY;  Surgeon: Odie Benne, MD;  Location: [facility] INVASIVE CV LAB;   Service: Cardiovascular; pre-TAVR:  pRCA 20%, m-dRCA 30%. D LM-pLAD 20%. RI 30%, mLAD 20%.  Severe AS with mean gradient measured at 52.8 mmHg.   TONSILLECTOMY     TRANSCATHETER AORTIC VALVE REPLACEMENT, TRANSFEMORAL N/A 10/26/2020   Procedure: TRANSCATHETER AORTIC VALVE REPLACEMENT, TRANSFEMORAL;  Surgeon: Odie Benne, MD;  Location: [facility] OR;  Service: Open Heart Surgery;; Medtronic Evolut-Pro + (size 26 mm, model # EVPROPLUS -26US, serial # [id number]); transient high-grade AV block noted so PPM left in place.   TRANSTHORACIC ECHOCARDIOGRAM  03/17/2019   a) 03/2019: EF 60 to 65%.  Moderate LVH.  GRII DD.  Mod-Severe AS (m grad 36 mmHg, peak 59 mmHg); b) 09/2019: EF 65-70%, No RWMA. Gr1 DD/hi LAP, Mild hi  PAP. Mod LA Dil. MOD AS (mean Grad 34.5 mmHg).  STABLE   TRANSTHORACIC ECHOCARDIOGRAM  08/27/2020   Admitted for CVA/Afib RVR & CHF (Progression to SEVERE SYMPTOMATIC AS):  Severe Calcific Aortic Stenosis (VTI AVA estimated 0.86 cm, mean gradient 42 mmHg, V-max 4.36 m/s).  EF 55 to 60%.  Severe concentric LVH.  Unable to determine diastolic parameters.  Moderately elevated PAP.  Mild LA dilation.  Mild circumferential pericardial effusion.  Trivial MR.  Severe MAC.   TRANSTHORACIC ECHOCARDIOGRAM  11/25/2020   1 Month s/p TAVR: EF 55 to 60%.  No R WMA.  GR 1 DD.  Elevated LVEDP.  Normal RV size with mildly elevated RVP estimated 44 mmHg.  Oscillating density in the RV suspect calcified chordal apparatus versus calcified thrombus.  Moderate LA dilation.  Normal IVC/RA P => Well-positioned 26 mm Medtronic Evolut Pro THVwith mean AOV gradient 11 mmHg.  Trivial PVL   TRANSTHORACIC ECHOCARDIOGRAM  11/02/2020   a) Day 1 Post-op TAVR 10/27/20:  Well-Positioned Supra Annular Medtronic Evolut Pro THP.  Mean gradient 12 mmHg, peak 24 mmHg.  AVA 1.8 cm.  Trivial PVL.  EF 60 to 65%.  Normal LV function.  Mild LVH.  Severe LA dilation.  Mild RA dilation.  Severe MAC;; b) 11/02/20: Normal structure and function  of the aortic valve prosthesis.  Mean gradient 9 mmHg (otherwise stable)   TUBAL LIGATION       Social History:   reports that she has never smoked. She has never used smokeless tobacco. She reports that she does not drink alcohol and does not use drugs.   Family History:  Her family history includes Alcohol abuse in an other family member; Arrhythmia in her sister; Arthritis in an other family member; CAD in her sister; Cancer in her mother; Depression in an other family member; Diabetes in an other family member; Heart attack (age of onset: 8) in her sister; Heart disease in an other family member; Hyperlipidemia in an other family member; Hypertension in an other family member; Stroke in an other family member.   Allergies Allergies  Allergen Reactions   Ibuprofen Other (See Comments)    Bleeding events     Home Medications  Prior to Admission medications   Medication Sig Start Date End Date Taking? Authorizing Provider  acetaminophen  (TYLENOL ) 500 MG tablet Take 500 mg by mouth every 6 (six) hours as needed for moderate pain.   Yes [provider]  ALPRAZolam  (XANAX ) 0.25 MG tablet Take 1 tablet (0.25 mg total) by mouth 3 (three) times daily as needed for anxiety. 03/14/23  Yes Roslyn Coombe, MD  atorvastatin  (LIPITOR ) 40 MG tablet TAKE 1 TABLET BY MOUTH EVERY DAY 09/04/22  Yes Arleen Lacer, MD  [employer] VITAMIN B12 1000 MCG tablet TAKE 1 TABLET BY MOUTH EVERY DAY 03/26/23  Yes Roslyn Coombe, MD  furosemide  (LASIX ) 20 MG tablet 3 tabs by mouth once daily Patient taking differently: Take 80 mg by mouth daily. 3 tabs by mouth once daily 04/03/23  Yes Roslyn Coombe, MD  hydrALAZINE  (APRESOLINE ) 25 MG tablet Take 1 tablet (25 mg total) by mouth every 8 (eight) hours. 04/03/23  Yes Roslyn Coombe, MD  isosorbide  mononitrate (IMDUR ) 30 MG 24 hr tablet Take 0.5 tablets (15 mg total) by mouth daily. 04/03/23  Yes Roslyn Coombe, MD  levothyroxine  (SYNTHROID ) 25 MCG tablet TAKE 1 TABLET BY  MOUTH EVERY DAY 03/26/23  Yes Roslyn Coombe, MD  metFORMIN  (GLUCOPHAGE ) 1000  MG tablet TAKE 1 TABLET BY MOUTH IN THE AM AND 1/2 IN THE PM 03/26/23  Yes Roslyn Coombe, MD  metoprolol  succinate (TOPROL -XL) 25 MG 24 hr tablet TAKE 1/2 TABLET BY MOUTH DAILY 03/26/23  Yes Roslyn Coombe, MD  mirtazapine  (REMERON ) 15 MG tablet TAKE 1 TABLET BY MOUTH EVERYDAY AT BEDTIME 10/30/22  Yes Roslyn Coombe, MD  pantoprazole  (PROTONIX ) 40 MG tablet TAKE 1 TABLET BY MOUTH TWICE A DAY 12/07/22  Yes Ardia Kraft, PA-C  solifenacin  (VESICARE ) 5 MG tablet Take 1 tablet (5 mg total) by mouth daily. 03/14/23  Yes Roslyn Coombe, MD  amiodarone  (PACERONE ) 200 MG tablet TAKE 1 TABLET BY MOUTH EVERY DAY Patient not taking: Reported on 05/14/2023 02/06/23   Arnoldo Lapping, MD  blood glucose meter kit and supplies Dispense based on patient and insurance preference. Use up to four times daily as directed. E11.9 04/15/18   Roslyn Coombe, MD  Blood Glucose Monitoring Suppl (ONE TOUCH ULTRA 2) w/Device KIT Use as directed daily 06/01/15   Roslyn Coombe, MD  citalopram  (CELEXA ) 20 MG tablet Take 1 tablet (20 mg total) by mouth daily. Patient not taking: Reported on 05/14/2023 09/25/22   Roslyn Coombe, MD  Lancets [facility] DELICA PLUS [place]) MISC USE AS DIRECTED TEST 1-2 TIMES A DAY 09/05/20   Roslyn Coombe, MD  [facility] ULTRA test strip USE AS INSTRUCTED TEST 1-2 TIMES A DAY 10/12/20   Roslyn Coombe, MD      Celene Coins MD. FCCP. [facility] Pager : [phone]  If no response to pager , please call [phone] until 7 pm After 7:00 pm call Elink  [phone]   05/28/2023

## 2023-05-28 NOTE — Plan of Care (Signed)
  Problem: Education: Goal: Knowledge of General Education information will improve Description: Including pain rating scale, medication(s)/side effects and non-pharmacologic comfort measures Outcome: Progressing   Problem: Coping: Goal: Level of anxiety will decrease Outcome: Progressing   Problem: Fluid Volume: Goal: Ability to maintain a balanced intake and output will improve Outcome: Progressing   Problem: Health Behavior/Discharge Planning: Goal: Ability to manage health-related needs will improve Outcome: Not Progressing   Problem: Clinical Measurements: Goal: Respiratory complications will improve Outcome: Not Progressing   Problem: Activity: Goal: Risk for activity intolerance will decrease Outcome: Not Progressing   Problem: Nutritional: Goal: Progress toward achieving an optimal weight will improve Outcome: Not Progressing

## 2023-05-28 NOTE — Telephone Encounter (Signed)
 Please arrange for hospital follow-up and 2 to 4 weeks with APP with chest x-ray Acute respiratory failure/amiodarone  toxicity

## 2023-05-28 NOTE — Progress Notes (Signed)
 Cardiology Progress Note   Patient ID: Mary Mccarthy MRN: [id number] DOB: 07-14-1940 Date of Encounter: 05/28/2023 Primary Cardiologist: Randene Bustard, MD  Subjective   Still short of breath, but perhaps a little better today- underwent chest CT yesterday- this has not yet been read by radiology, however, I personally reviewed the images and there is diffuse parenchymal opacities and areas of consolidation - ESR is elevated at 85. Could be an organizing pneumonia or pneumonitis, ?amio toxicity - this is being held. Creatinine stable at 3.05 (she was another 1.3L negative - overall 10L negative).  Sodium improved to 132 today.  Telemetry   Sinus rhythm - personally reviewed  ECG   N/A  Physical Exam   Vitals:   05/28/23 0039 05/28/23 0444 05/28/23 0733 05/28/23 0826  BP: (!) 144/47 (!) 153/46 (!) 153/57 (!) 153/44  Pulse: 61 64 62 72  Resp: 20 18 (!) 23   Temp: 97.6 F (36.4 C) 97.6 F (36.4 C) (!) 97.5 F (36.4 C) 97.6 F (36.4 C)  TempSrc: Oral Oral Oral Oral  SpO2: 95% 96% 92% (!) 85%  Weight: 81.7 kg     Height:        Intake/Output Summary (Last 24 hours) at 05/28/2023 0857 Last data filed at 05/28/2023 0445 Gross per 24 hour  Intake 340 ml  Output 1650 ml  Net -1310 ml       05/28/2023   12:39 AM 05/27/2023    5:00 AM 05/26/2023    4:56 AM  Last 3 Weights  Weight (lbs) 180 lb 1.9 oz 180 lb 12.4 oz 180 lb 8.9 oz  Weight (kg) 81.7 kg 82 kg 81.9 kg    Body mass index is 34.03 kg/m.   General appearance: alert and no distress Lungs: diminished breath sounds bilaterally, rales bilaterally, and pops/faint wheezes diffusely Heart: irregularly irregular rhythm Extremities: extremities normal, atraumatic, no cyanosis or edema Neurologic: Grossly normal  Cardiac Studies   TTE 05/15/2023  1. Left ventricular ejection fraction, by estimation, is 60 to 65%. The  left ventricle has normal function. The left ventricle has no regional  wall motion  abnormalities. There is severe asymmetric left ventricular  hypertrophy of the septal segment. Left   ventricular diastolic function could not be evaluated.   2. Right ventricular systolic function is normal. The right ventricular  size is mildly enlarged. There is moderately elevated pulmonary artery  systolic pressure. The estimated right ventricular systolic pressure is  53.7 mmHg.   3. Left atrial size was mildly dilated.   4. Right atrial size was mildly dilated.   5. The mitral valve is degenerative. Trivial mitral valve regurgitation.  Mild mitral stenosis. The mean mitral valve gradient is 3.0 mmHg. Severe  mitral annular calcification.   6. Tricuspid valve regurgitation is mild to moderate.   7. Mild paravalvular leak adjacent to the intervalvular fibrosa. The  aortic valve has been repaired/replaced. Aortic valve regurgitation is  mild. No aortic stenosis is present. There is a 26 mm Medtronic stented  (TAVR) valve present in the aortic  position. Procedure Date: 10/26/2020. Echo findings are consistent with  normal structure and function of the aortic valve prosthesis. Echo  findings are consistent with perivalvular leak of the aortic prosthesis.  Aortic regurgitation PHT measures 514 msec.   Aortic valve mean gradient measures 9.0 mmHg. Aortic valve Vmax measures  1.88 m/s.   8. The inferior vena cava is dilated in size with <50% respiratory  variability, suggesting right atrial pressure  of 15 mmHg.   Patient Profile   Mary Mccarthy is a 83 y.o. female with persistent atrial fibrillation, HFpEF, aortic stenosis status post TAVR, diabetes, obesity who was admitted on 05/14/2023 for acute on chronic diastolic heart failure.   Assessment & Plan   # Acute on chronic diastolic heart failure - Holding diuretics - good output yesterday, creatinine stable - Chest CT reviewed - there is parenchymal lung disease, probably more than edema. Would benefit from pulmonary  evaluation. - Sodium is improving  # Acute hypoxic respiratory failure # Possible organizing pneumonia # Possible amiodarone  lung toxicity # Acute diastolic heart failure Chest CT shows parencyhmal lung disease ?pneumonitis or organizing pneumonia, ?amio toxicity - ESR 85. Would benefit from pulmonary evaluation - possibly steroids.  # CKD 3b/IV # AKI - Creatinine stable, good urine output, hold diuretics - Check BNP in am   # Persistent atrial fibrillation - Amiodarone  on hold due to concern for possible lung toxicity.  Chest CT and ESR ordered today.  Continue beta-blocker.  Not on oral anticoagulation due to history of bleeding. - Afib is rate-controlled  # HTN - BP remains elevated in the 150's, will add low dose amlodipine  2.5 mg daily. - Continue home metoprolol , Imdur  and hydralazine   # Nonobstructive CAD - Continue home statin.  Not on aspirin .  # Aortic stenosis status post TAVR - Stable    For questions or updates, please contact [facility] Please consult [email or web address] for contact info under   Hazle Lites, MD, [facility], FNLA, FACP  [place]  [facility]  Medical Director of the [facility]  [facility] Diplomate of the American Board of Clinical Lipidology Attending Cardiologist  Direct Dial: [phone]  Fax: [phone]  Website:  [email or web address]  05/28/2023 8:57 AM

## 2023-05-28 NOTE — Progress Notes (Signed)
 Heart Failure Stewardship Pharmacist Progress Note   PCP: Roslyn Coombe, MD PCP-Cardiologist: Randene Bustard, MD    HPI:  83 yo F with PMH of HTN, HLD, T2DM, CKD3, CAD, paroxysmal afib, recurrent GI bleed, severe aortic stenosis s/p TAVR complicated with transient heart block, respiratory failure on 2L [place] at BL, hypothyroidism, GERD   Patient previously admitted 02/2023 for CHF exacerbation. Patient was diuresed during admission and home lasix  was increased from prn to daily.   Presented to the ED on 4/7 with worsening SOB with exertion and leg swelling over the last 2 weeks. Also presented with productive cough, chest pain/tightness, NVD, and wheezing. Reports PCP instructed her to take an additional dose of 20mg  lasix  in the setting of weight gain of 3 lb - no improvement per pt. Endorses right flank pain 2/2 coughing.   In the ED, patient was hypertensive and hypoxic (did not present on her home O2) wheezing, rhonchi, rales and BL LE edema present on exam. Slight murmur present. JVD Patient diuresed with lasix  40mg  x2 in the ED. CXR showed mild to moderate pulmonary vascular congestion. No dense consolidation or lung collapse. ECHO 4/8 showed LVEF 60-65% (unchanged from prior 11/2022) with normal LV function. Severe asymmetric LV hypertrophy of septal segment. LV diastolic function could not be evaluated. Normal RV function. Mildly enlarged LV. MV is degenerative with trivial regurgitation. Mild stenosis and severe annular calcifications also present. Mild to moderate tricuspid valve regurgitation. Cardiology was consulted, planning with diuresis for now. Repeat CXR 4/17 showing mild decrease in patchy multifocal airspace a nodular airspace. Opacities and cardiomegaly. Repeat CXR on 4/21 with coarse and asymmetric bilateral opacity greater on the left not significantly changed from 4/17, favor bilateral infection rather than edema. CT chest on 4/20 pending.   Met with patient and her son at  bedside today. States she is feeling better today. Still somewhat short of breath, still requiring 5L HFNC. Volume status improved. States she felt like she peed a lot last night. Last dose of torsemide  given on 4/18 and one dose of IV lasix  given on 4/19. Hopeful to get some answers what may be going on with her lungs.   Current HF Medications: Beta Blocker: metoprolol  succinate 12.5 mg daily  Other: isosorbide  mononitrate 60 mg daily, hydralazine  75 mg q8h  Prior to admission HF Medications: Diuretic: Lasix  60mg  daily Beta blocker: Metoprolol  succinate 12.5mg  daily  Other: Isosorbide  mononitrate 15mg  daily, hydralazine  25mg  q8h  Pertinent Lab Values: Serum creatinine 3.05 (BL 1.3-1.5), BUN 80, Potassium 4.4, Sodium 132, BNP 977.8, Magnesium  2.4, A1c 6.5   Vital Signs: Weight: 180 lbs (admission weight: 191 lbs) - bed weights Blood pressure: 150/40s Heart rate: 50-70s I/O: net -0.6L yesterday; net -10.1L since admission 5L HFNC  Medication Assistance / Insurance Benefits Check: Does the patient have prescription insurance?  Yes Type of insurance plan: Medicare  Outpatient Pharmacy:  Prior to admission [facility]  Is the patient willing to use [facility] TOC pharmacy at discharge? Yes Is the patient willing to transition their outpatient pharmacy to utilize a [facility] outpatient pharmacy? No    Assessment: 1. Acute on chronic diastolic CHF (LVEF 60-65%). NYHA class 2-3 symptoms.Strict I/Os and daily weights. Keep K>4 and Mg>2. Overall oxygen  status not at baseline - due to aspiration PNA more than volume overload based on last CXR.   - Holding diuretics. Volume status ok. Creatinine relatively stable from yesterday but remains elevated.  - Continue hydralazine  75 mg q8h  and Imdur  60 mg daily - Agree with deferring SGLT2i given hx of 3 UTI in prior 1-2 months  - Continue metoprolol  succinate 12.5 mg daily - HR stable 50-70s.   Plan: 1) Medication changes  recommended at this time: - Continue current regimen  2) Patient assistance: - Contacted son and he is interested in utilizing [facility] for medication changes upon discharge  but [facility] and remaining prescriptions  - Son reported that current medications are affordable  3)  Education  - Patient has been educated on current HF medications and potential additions to HF medication regimen - Patient verbalizes understanding that over the next few months, these medication doses may change and more medications may be added to optimize HF regimen - Patient has been educated on basic disease state pathophysiology and goals of therapy   Jerilyn Monte, PharmD, BCPS Heart Failure Stewardship Pharmacist Phone [phone]

## 2023-05-28 NOTE — Progress Notes (Signed)
 Progress Note   Patient: Mary Mccarthy WGN:[id number] DOB: [birth year] DOA: 05/14/2023     14 DOS: the patient was seen and examined on 05/28/2023   Brief hospital course: Mary Mccarthy was admitted to the hospital with the working diagnosis of heart failure exacerbation.  83 yo female with the past medical history of hypertension, hyperlipidemia, paroxysmal atrial fibrillation, aortic stenosis sp TAVR, T2DM and obesity who presented with dyspnea and lower extremity edema. Reported 2 weeks of worsening symptoms, along with weight gain. Her dyspnea and edema were persistent despite increasing the dose of her oral furosemide  at home.  At the time of her initial physical examination her 02 saturation was 75% on room air, blood pressure 196/70, HR 62, RR 30.  Lungs with decreased breath sounds and rales bilaterally, up to mid zones, heart with S1 and S2 present and regular, abdomen with no distention and positive bilateral lower extremity, ++ pitting edema.   Na 135, K 4,1 Cl 100, bicarbonate 25 glucose 109, bun 24 cr 1,98  BNP 977 High sensitive troponin 35 and 31  Lactic acid 2,0 and 1,0  Wbc 5,2 hgb 8,1 plt 223  TSH 5,6  Sars covid 19 negative  Influenza negative  RSV negative  Urine analysis SG 1,014, protein 30, negative leukocytes and negative hgb   Chest radiograph with hypoinflation, positive cardiomegaly, bilateral hilar vascular congestion with cephalization of the vasculature.   EKG 62 bpm, right axis deviation, qtc 494, sinus rhythm with poor R R wave progression with no significant ST segment or T wave changes.   Patient was placed on aggressive diuresis.  04/13 developed aspiration pneumonia and was placed on antibiotic therapy.  04/19 placed on steroids for organizing pneumonia and continue IV furosemide  for diuresis. Improving 02 requirements.  04/10 continue to decrease 02 requirements. Holding furosemide  due to rise in serum cr.  04/11 dyspnea is improving along  with 02 requirements. CT noted signs of interstitial lung disease, consulting pulmonary.   Assessment and Plan: * Acute on chronic diastolic CHF (congestive heart failure) (HCC) Echocardiogram with preserved LV systolic function with EF 60 to 65%, severe LVH, RV systolic function preserved, RVSP 53.7 mmHg, LA and RA with mild dilatation, mild mitral stenosis, mild to moderate tricuspid valve regurgitation, sp TAVR with mild paravalvular leak   Documented urine output 1,653ml, since admission negative fluid balance - 9,619 ml.   Afterload reduction with hydralazine  and isosorbide .  B blocker with metoprolol .  Holding furosemide  due to rising serum cr  Limited pharmacologic therapy due to reduced GFR.   Acute on chronic hypoxemic respiratory failure due to acute cardiogenic pulmonary edema.  04/17 Chest radiograph with bilateral interstitial infiltrates, with mild improvement on the right side.  Possible organizing pneumonia, possible amiodarone  toxicity.  Discontinued amiodarone  on 05/25/23.  Continue with systemic corticosteroids and bronchodilators.  Continue to decrease 02 requirements, today 02 saturation is 95% on 5 to 6 L/min per [facility]   CT with interlobular and intralobular septal thickening with bilateral ground glass opacities. Some traction bronchiectasis.  Elevated inflammatory markers with sed rate 85.  Will consult pulmonary.    Paroxysmal atrial fibrillation (HCC) Continue rate control with metoprolol .  Off amiodarone  due to possible lung toxicity  Patient not on anticoagulation.   Coronary artery disease, non-occlusive Continue atorvastatin  and blood pressure control   Hypertensive urgency Continue blood pressure control with metoprolol , hydralazine  and isosorbide .   CKD (chronic kidney disease), stage IV (HCC) Hyponatremia. AKI  Today renal function with serum cr  at 3,0 with K at 4,4 and serum bicarbonate at 30  Na 132 and Mg 2.4   Continue to hold on furosemide   and follow up renal function in am.  Avoid hypotension and nephrotoxic medications   Type 2 diabetes mellitus with hyperlipidemia (HCC) Continue insulin  sliding scale for glucose cover and monitoring  Basal insulin  20 units. Uncontrolled hyperglycemia with fasting glucose Will change dose of insulin  sliding scale for now, she may need pre meal coverage as well.  Likely steroid induced hyperglycemia.   Continue statin   Anxiety with depression Continue mirtazapine  and alprazolam .   Hypothyroidism Continue with levothyroxine    Normocytic anemia Cell count has been stable.   Aspiration pneumonia (HCC) 04/13 radiograph with multiple patchy bilateral infiltrates.  Patient afebrile and no leukocytosis  Continue with po Augmentin   Continue airway clearing techniques with flutter valve and incentive spirometer.   Possible organizing pneumonia, holding amiodarone  and on high dose systemic corticosteroids.  CT chest with signs of ILD. Follow up with pulmonary recommendations.       Subjective: Patient with improvement in dyspnea no chest pain, no palpitations   Physical Exam: Vitals:   05/28/23 0835 05/28/23 0842 05/28/23 0937 05/28/23 1113  BP:    (!) 153/49  Pulse:    70  Resp:    20  Temp:    97.6 F (36.4 C)  TempSrc:    Oral  SpO2: 90% 91% 92% 93%  Weight:      Height:       Neurology awake and alert ENT with mild pallor with no icterus Cardiovascular with S1 and S2 present and regular with no gallops, rubs or murmurs Respiratory with bilateral rales with no wheezing or rhonchi Abdomen with no distention  No lower extremity edema  Data Reviewed:    Family Communication: no family at the bedside   Disposition: Status is: Inpatient Remains inpatient appropriate because: recovering respiratory failure   Planned Discharge Destination: Home    Author: Albertus Alt, MD 05/28/2023 2:21 PM  For on call review [email or web address].

## 2023-05-28 NOTE — TOC Progression Note (Addendum)
 Transition of Care [facility]) - Progression Note    Patient Details  Name: Mary Mccarthy MRN: [id number] Date of Birth: 1940/06/23  Transition of Care [facility]) CM/SW Contact  Ernst Heap Phone Number: [phone] 05/28/2023, 1:08 PM  Clinical Narrative:   HF CSW met with the patient at bedside to present bed offers list. Patient stated that her son will be up here later to review.                 Skilled Nursing Rehab Facilities-   [email or web address]  ?  ?  Ratings out of 5 stars (5 the highest)  ?   Name  Address   Phone #  Quality Care  Staffing  Health Inspection  Overall   [facility]  [address], [facility]  [phone]  2  2  2  2    [facility]  [address], [facility]  [phone]  4  2  2  2    [facility]  [address] [place]  [phone]  5  1  2  2    [facility]  [phone] [address], [place]  [phone]  2  1  3  2    [facility] ([place]  [address]  [phone]  3  1  1  1    [facility]  [address], [place]  [phone]  2  1  2  1    [facility]  [address], [place]  [phone]  3  2  1  1    [facility]**  [address] [place]  [phone]  3  2  2  2     3:28 PM- HF CSW called the patients son and left a VM asking to be called back.     Expected Discharge Plan: Skilled Nursing Facility Barriers to Discharge: Continued Medical Work up  Expected Discharge Plan and Services     Post Acute Care Choice: Skilled Nursing Facility Living arrangements for the past 2 months: Single Family Home                                       Social Determinants of Health (SDOH) Interventions SDOH Screenings   Food Insecurity: No Food Insecurity (05/14/2023)  Recent Concern: Food Insecurity - Food Insecurity Present (03/01/2023)  Housing: Low Risk  (05/17/2023)  Recent Concern: Housing - High Risk (03/05/2023)   Transportation Needs: No Transportation Needs (05/17/2023)  Utilities: At Risk (05/14/2023)  Alcohol Screen: Low Risk  (05/17/2023)  Depression (PHQ2-9): Low Risk  (03/14/2023)  Financial Resource Strain: Low Risk  (05/17/2023)  Physical Activity: Inactive (05/11/2022)  Social Connections: Socially Isolated (05/14/2023)  Stress: No Stress Concern Present (05/11/2022)  Tobacco Use: Low Risk  (05/14/2023)    Readmission Risk Interventions    11/04/2020   11:24 AM  Readmission Risk Prevention Plan  Transportation Screening Complete  PCP or Specialist Appt within 3-5 Days Complete  HRI or Home Care Consult Complete  Social Work Consult for Recovery Care Planning/Counseling Not Complete  Palliative Care Screening Not Applicable  Medication Review [employer]) Complete

## 2023-05-29 DIAGNOSIS — I480 Paroxysmal atrial fibrillation: Secondary | ICD-10-CM | POA: Diagnosis not present

## 2023-05-29 DIAGNOSIS — I5033 Acute on chronic diastolic (congestive) heart failure: Secondary | ICD-10-CM | POA: Diagnosis not present

## 2023-05-29 DIAGNOSIS — J9601 Acute respiratory failure with hypoxia: Secondary | ICD-10-CM

## 2023-05-29 DIAGNOSIS — T462X5A Adverse effect of other antidysrhythmic drugs, initial encounter: Secondary | ICD-10-CM | POA: Diagnosis not present

## 2023-05-29 DIAGNOSIS — T462X1A Poisoning by other antidysrhythmic drugs, accidental (unintentional), initial encounter: Secondary | ICD-10-CM

## 2023-05-29 DIAGNOSIS — J984 Other disorders of lung: Secondary | ICD-10-CM | POA: Diagnosis not present

## 2023-05-29 LAB — BASIC METABOLIC PANEL WITH GFR
Anion gap: 10 (ref 5–15)
BUN: 75 mg/dL — ABNORMAL HIGH (ref 8–23)
CO2: 30 mmol/L (ref 22–32)
Calcium: 9 mg/dL (ref 8.9–10.3)
Chloride: 94 mmol/L — ABNORMAL LOW (ref 98–111)
Creatinine, Ser: 2.2 mg/dL — ABNORMAL HIGH (ref 0.44–1.00)
GFR, Estimated: 22 mL/min — ABNORMAL LOW (ref >60)
Glucose, Bld: 204 mg/dL — ABNORMAL HIGH (ref 70–99)
Potassium: 4.2 mmol/L (ref 3.5–5.1)
Sodium: 134 mmol/L — ABNORMAL LOW (ref 135–145)

## 2023-05-29 LAB — GLUCOSE, CAPILLARY
Glucose-Capillary: 210 mg/dL — ABNORMAL HIGH (ref 70–99)
Glucose-Capillary: 320 mg/dL — ABNORMAL HIGH (ref 70–99)
Glucose-Capillary: 234 mg/dL — ABNORMAL HIGH (ref 70–99)
Glucose-Capillary: 130 mg/dL — ABNORMAL HIGH (ref 70–99)

## 2023-05-29 LAB — BRAIN NATRIURETIC PEPTIDE: B Natriuretic Peptide: 591.7 pg/mL — ABNORMAL HIGH (ref 0.0–100.0)

## 2023-05-29 MED ORDER — PREDNISONE 20 MG PO TABS
40.0000 mg | ORAL_TABLET | Freq: Every day | ORAL | Status: DC
  Administered 2023-05-30 – 2023-06-07 (×9): 40 mg via ORAL
  Filled 2023-05-29 (×9): qty 2

## 2023-05-29 NOTE — Progress Notes (Signed)
 Heart Failure Stewardship Pharmacist Progress Note   PCP: Roslyn Coombe, MD PCP-Cardiologist: Randene Bustard, MD    HPI:  83 yo F with PMH of HTN, HLD, T2DM, CKD3, CAD, paroxysmal afib, recurrent GI bleed, severe aortic stenosis s/p TAVR complicated with transient heart block, respiratory failure on 2L [place] at [facility], hypothyroidism, GERD   Patient previously admitted 02/2023 for CHF exacerbation. Patient was diuresed during admission and home lasix  was increased from prn to daily.   Presented to the ED on 4/7 with worsening SOB with exertion and leg swelling over the last 2 weeks. Also presented with productive cough, chest pain/tightness, NVD, and wheezing. Reports PCP instructed her to take an additional dose of 20mg  lasix  in the setting of weight gain of 3 lb - no improvement per pt. Endorses right flank pain 2/2 coughing.   In the ED, patient was hypertensive and hypoxic (did not present on her home O2) wheezing, rhonchi, rales and BL LE edema present on exam. Slight murmur present. JVD Patient diuresed with lasix  40mg  x2 in the ED. CXR showed mild to moderate pulmonary vascular congestion. No dense consolidation or lung collapse. ECHO 4/8 showed LVEF 60-65% (unchanged from prior 11/2022) with normal LV function. Severe asymmetric LV hypertrophy of septal segment. LV diastolic function could not be evaluated. Normal RV function. Mildly enlarged LV. MV is degenerative with trivial regurgitation. Mild stenosis and severe annular calcifications also present. Mild to moderate tricuspid valve regurgitation. Cardiology was consulted, planning with diuresis for now. Repeat CXR 4/17 showing mild decrease in patchy multifocal airspace a nodular airspace. Opacities and cardiomegaly. Repeat CXR on 4/21 with coarse and asymmetric bilateral opacity greater on the left not significantly changed from 4/17, favor bilateral infection rather than edema. CT chest on 4/20 with diffuse multifocal patchy airspace and  ground glass opacities throughout both lungs. Findings most consistent with multifocal pneumonia. PCCM consulted and believe this is most consistent with amiodarone  toxicity - she is now improving off amiodarone  and on steroids.   Patient reports she is feeling much better today. Still requiring 5L HFNC. Volume status improved, no LE edema. Was able to get up and walk 3 different times and felt good doing so.   Current HF Medications: Beta Blocker: metoprolol  succinate 12.5 mg daily  Other: isosorbide  mononitrate 60 mg daily, hydralazine  75 mg q8h  Prior to admission HF Medications: Diuretic: Lasix  60mg  daily Beta blocker: Metoprolol  succinate 12.5mg  daily  Other: Isosorbide  mononitrate 15mg  daily, hydralazine  25mg  q8h  Pertinent Lab Values: Serum creatinine 3.05>2.20 (BL 1.3-1.5), BUN 75, Potassium 4.2, Sodium 134, BNP 977.8, Magnesium  2.4, A1c 6.5   Vital Signs: Weight: 181 lbs (admission weight: 191 lbs) - bed weights Blood pressure: 150/40s Heart rate: 50-70s I/O: net -0.4L yesterday; net -10.3L since admission 5L HFNC  Medication Assistance / Insurance Benefits Check: Does the patient have prescription insurance?  Yes Type of insurance plan: Medicare  Outpatient Pharmacy:  Prior to [facility]  Is the patient willing to use [facility] TOC pharmacy at discharge? Yes Is the patient willing to transition their outpatient pharmacy to utilize a [facility] outpatient pharmacy? No    Assessment: 1. Acute on chronic diastolic CHF (LVEF 60-65%). NYHA class 2-3 symptoms.Strict I/Os and daily weights. Keep K>4 and Mg>2. Overall oxygen  status not at baseline - amiodarone  toxicity per PCCM.  - Holding diuretics. Volume status ok. Creatinine improved today.  - Continue hydralazine  75 mg q8h and Imdur  60 mg daily - Agree with deferring  SGLT2i given hx of 3 UTI in prior 1-2 months  - Continue metoprolol  succinate 12.5 mg daily - HR stable 50-70s.   Plan: 1)  Medication changes recommended at this time: - No changes  2) Patient assistance: - Contacted son and he is interested in utilizing [facility] for medication changes upon discharge  but [facility] and remaining prescriptions  - Son reported that current medications are affordable  3)  Education  - Patient has been educated on current HF medications and potential additions to HF medication regimen - Patient verbalizes understanding that over the next few months, these medication doses may change and more medications may be added to optimize HF regimen - Patient has been educated on basic disease state pathophysiology and goals of therapy   Jerilyn Monte, PharmD, BCPS Heart Failure Stewardship Pharmacist Phone [phone]

## 2023-05-29 NOTE — Plan of Care (Signed)
   Problem: Education: Goal: Knowledge of General Education information will improve Description: Including pain rating scale, medication(s)/side effects and non-pharmacologic comfort measures Outcome: Progressing   Problem: Safety: Goal: Ability to remain free from injury will improve Outcome: Progressing   Problem: Skin Integrity: Goal: Risk for impaired skin integrity will decrease Outcome: Progressing

## 2023-05-29 NOTE — Plan of Care (Signed)
  Problem: Education: Goal: Knowledge of General Education information will improve Description: Including pain rating scale, medication(s)/side effects and non-pharmacologic comfort measures Outcome: Progressing   Problem: Coping: Goal: Level of anxiety will decrease Outcome: Progressing   Problem: Safety: Goal: Ability to remain free from injury will improve Outcome: Progressing   Problem: Health Behavior/Discharge Planning: Goal: Ability to manage health-related needs will improve Outcome: Not Progressing   Problem: Clinical Measurements: Goal: Respiratory complications will improve Outcome: Not Progressing   Problem: Activity: Goal: Risk for activity intolerance will decrease Outcome: Not Progressing   Problem: Coping: Goal: Ability to adjust to condition or change in health will improve Outcome: Not Progressing   Problem: Metabolic: Goal: Ability to maintain appropriate glucose levels will improve Outcome: Not Progressing   Problem: Nutritional: Goal: Progress toward achieving an optimal weight will improve Outcome: Not Progressing

## 2023-05-29 NOTE — Progress Notes (Signed)
  Progress Note   Patient: Mary Mccarthy UJW:[id number] DOB: 05-Jan-1941 DOA: 05/14/2023     15 DOS: the patient was seen and examined on 05/29/2023   Brief hospital course: 83 yo female with the past medical history of hypertension, hyperlipidemia, paroxysmal atrial fibrillation, aortic stenosis sp TAVR, T2DM and obesity who presented with dyspnea and lower extremity edema.  Found to have acute on chronic HFpEF with worsening hypoxia secondary to likely amiodarone  toxicity.  Assessment and Plan:  Acute hypoxic respiratory failure -Continues on 5-6 l nasal cannula.  Multifactorial etiology given HFpEF and amiodarone  toxicity.  Previous baseline was 2 L as needed.  Likely that patient will have worsening baseline hypoxia.  Possible amiodarone  lung toxicity - Chest imaging 4/20 noting worsening bilateral interstitial infiltrates with possible organizing pneumonia.  Evaluated by pulmonology, consistent with amiodarone  toxicity.  Discontinued amiodarone , initiated corticosteroids.  Transitioning from IV Solu-Medrol  to prednisone  40 mg daily.  Slow taper over 2-4 weeks per pulmonology.  Follow-up with pulmonology in the outpatient setting to reassess oxygen  needs and repeat chest x-ray.  Acute on chronic HFpEF - Responded well to diuresis, greater than 10 L out through the course of hospital stay.  Diuretics on hold at this time.  Followed closely by cardiology.  Acute kidney injury on CKD 3B - Creatinine showing marked improvement from yesterday, down to 2.20 from 3.05.  Will recheck BMP in AM.  Persistent atrial fibrillation -Amiodarone  discontinued.  Currently normal sinus rhythm.  Continue on telemetry.  Diabetes mellitus - Insulin  glargine 20 units.  Insulin  sliding scale on board.  Likely exacerbated by corticosteroid use.  CAD/hypertension - Continue metoprolol , Imdur , hydralazine , amlodipine .  Statin on board.  Not on aspirin .  Aortic stenosis s/p TAVR - Stable.  Possible  aspiration pneumonia - Appears to be ruled out secondary to diagnosis above (amiodarone  toxicity).  Physical debilitation muscle weakness - Exacerbated by hypoxia and deconditioning.  Evaluated by PT/OT.  Currently working with [facility] on SNF placement.     Subjective: Patient sitting up at the bedside, nasal cannula in place.  States she feels a little bit better than yesterday.  Denies any worsening shortness of breath, chest pain, nausea, vomiting, abdominal pain.  Urine output excellent.  Physical Exam: Vitals:   05/29/23 0034 05/29/23 0450 05/29/23 0735 05/29/23 1207  BP: (!) 145/46 (!) 154/52 (!) 166/58 (!) 142/68  Pulse: 64 67 71 63  Resp: 20 19 20 19   Temp: 98 F (36.7 C) (P) 97.6 F (36.4 C) 98.3 F (36.8 C) 97.6 F (36.4 C)  TempSrc: Oral (P) Oral Oral   SpO2: 91% 92% 94% (!) 89%  Weight:  (P) 82.1 kg    Height:       GENERAL:  Alert, pleasant, no acute distress, disheveled HEENT:  EOMI, nasal cannula CARDIOVASCULAR:  RRR, no murmurs appreciated RESPIRATORY: Poor air movement bilaterally GASTROINTESTINAL:  Soft, nontender, nondistended EXTREMITIES:  No LE edema bilaterally NEURO:  No new focal deficits appreciated SKIN:  No rashes noted PSYCH:  Appropriate mood and affect   Data Reviewed:  There are no new results to review at this time.  Family Communication: None at bedside  Disposition: Status is: Inpatient Remains inpatient appropriate because: Acute hypoxic respiratory failure  Planned Discharge Destination: Skilled nursing facility    Time spent: 35 minutes  Author: Jodeane Mulligan, DO 05/29/2023 2:04 PM  For on call review [email or web address].

## 2023-05-29 NOTE — Progress Notes (Signed)
 Cardiology Progress Note   Patient ID: Mary Mccarthy MRN: [id number] DOB: [birth year] Date of Encounter: 05/29/2023 Primary Cardiologist: Randene Bustard, MD  Subjective   Feels better today. Breathing improved somewhat- appreciate PCCM recommendations yesterday, suspicion of amiodarone  lung toxicity is high - plan steroid taper.  Still somewhat net negative overnight - off diuretics. Almost 10L negative during this hospitalization.  Labs pending today. Noted to be back in sinus rhythm on telemetry.  Telemetry   Sinus rhythm - personally reviewed  ECG   N/A  Physical Exam   Vitals:   05/28/23 1954 05/29/23 0034 05/29/23 0450 05/29/23 0735  BP:  (!) 145/46 (!) 154/52 (!) 166/58  Pulse:  64 67 71  Resp:  20 19 20   Temp:  98 F (36.7 C) (P) 97.6 F (36.4 C) 98.3 F (36.8 C)  TempSrc:  Oral (P) Oral Oral  SpO2: 90% 91% 92% 94%  Weight:   (P) 82.1 kg   Height:        Intake/Output Summary (Last 24 hours) at 05/29/2023 0955 Last data filed at 05/29/2023 0910 Gross per 24 hour  Intake 283 ml  Output 850 ml  Net -567 ml       05/29/2023    4:50 AM 05/28/2023   12:39 AM 05/27/2023    5:00 AM  Last 3 Weights  Weight (lbs) 181 lb 180 lb 1.9 oz 180 lb 12.4 oz  Weight (kg) 82.1 kg 81.7 kg 82 kg    Body mass index is 34.2 kg/m (pended).   General appearance: alert and no distress Lungs: diminished breath sounds bilaterally, rales bilaterally, and pops/faint wheezes diffusely Heart: irregularly irregular rhythm Extremities: extremities normal, atraumatic, no cyanosis or edema Neurologic: Grossly normal  Cardiac Studies   TTE 05/15/2023  1. Left ventricular ejection fraction, by estimation, is 60 to 65%. The  left ventricle has normal function. The left ventricle has no regional  wall motion abnormalities. There is severe asymmetric left ventricular  hypertrophy of the septal segment. Left   ventricular diastolic function could not be evaluated.   2. Right  ventricular systolic function is normal. The right ventricular  size is mildly enlarged. There is moderately elevated pulmonary artery  systolic pressure. The estimated right ventricular systolic pressure is  53.7 mmHg.   3. Left atrial size was mildly dilated.   4. Right atrial size was mildly dilated.   5. The mitral valve is degenerative. Trivial mitral valve regurgitation.  Mild mitral stenosis. The mean mitral valve gradient is 3.0 mmHg. Severe  mitral annular calcification.   6. Tricuspid valve regurgitation is mild to moderate.   7. Mild paravalvular leak adjacent to the intervalvular fibrosa. The  aortic valve has been repaired/replaced. Aortic valve regurgitation is  mild. No aortic stenosis is present. There is a 26 mm Medtronic stented  (TAVR) valve present in the aortic  position. Procedure Date: 10/26/2020. Echo findings are consistent with  normal structure and function of the aortic valve prosthesis. Echo  findings are consistent with perivalvular leak of the aortic prosthesis.  Aortic regurgitation PHT measures 514 msec.   Aortic valve mean gradient measures 9.0 mmHg. Aortic valve Vmax measures  1.88 m/s.   8. The inferior vena cava is dilated in size with <50% respiratory  variability, suggesting right atrial pressure of 15 mmHg.   Patient Profile   Mary Mccarthy is a 83 y.o. female with persistent atrial fibrillation, HFpEF, aortic stenosis status post TAVR, diabetes, obesity who was admitted on  05/14/2023 for acute on chronic diastolic heart failure.   Assessment & Plan   # Acute on chronic diastolic heart failure - Holding diuretics - good output yesterday, creatinine stable - Chest CT reviewed - there is parenchymal lung disease, probably more than edema. - Sodium is improving - repeat BMET pending today.  # Acute hypoxic respiratory failure # Possible organizing pneumonia # Possible amiodarone  lung toxicity # Acute diastolic heart failure Chest CT  shows parencyhmal lung disease - Pulmonary suspects amiodarone  lung toxicity - continue to hold amiodarone . Plan steroid taper.  # CKD 3b/IV # AKI - Creatinine stable, good urine output, hold diuretics - Check BNP in am   # Persistent atrial fibrillation - Amiodarone  on hold due to concern for possible lung toxicity.   - She has converted back to sinus rhythm  # HTN - BP remains elevated - amlodipine  held yesterrday, but given 2.5 mg today. - Continue home metoprolol , Imdur  and hydralazine   # Nonobstructive CAD - Continue home statin.  Not on aspirin .  # Aortic stenosis status post TAVR - Stable    For questions or updates, please contact [facility] Please consult [email or web address] for contact info under   Hazle Lites, MD, [facility], FNLA, FACP  [place]  [facility]  Medical Director of the [facility]  [facility] Diplomate of the American Board of Clinical Lipidology Attending Cardiologist  Direct Dial: [phone]  Fax: [phone]  Website:  [email or web address]  05/29/2023 9:55 AM

## 2023-05-29 NOTE — Inpatient Diabetes Management (Signed)
 Inpatient Diabetes Program Recommendations  AACE/ADA: New Consensus Statement on Inpatient Glycemic Control (2015)  Target Ranges:  Prepandial:   less than 140 mg/dL      Peak postprandial:   less than 180 mg/dL (1-2 hours)      Critically ill patients:  140 - 180 mg/dL   Lab Results  Component Value Date   GLUCAP 320 (H) 05/29/2023   HGBA1C 6.5 03/14/2023    Latest Reference Range & Units 05/28/23 16:11 05/28/23 21:08 05/29/23 06:20 05/29/23 12:01  Glucose-Capillary 70 - 99 mg/dL 161 (H) 096 (H) 045 (H) 320 (H)  (H): Data is abnormally high Review of Glycemic Control  Diabetes history: DM2 Outpatient Diabetes medications: Metformin  1000 mg in am, 500 mg in pm Current orders for Inpatient glycemic control: Semglee  20 units daily, Novolog  0-15 units correction scale TID, Novolog  0-5 units HS scale  Inpatient Diabetes Program Recommendations:   Noted that blood sugars have been greater than 180 mg/dl.  Recommend adding Novolog  4 units TID with meals if eating at least 50% of meals and if blood sugars continue to be elevated.  Nick Barman RN BSN CDE Diabetes Coordinator Pager: [phone]  8am-5pm

## 2023-05-29 NOTE — Progress Notes (Signed)
 Physical Therapy Treatment Patient Details Name: Mary Mccarthy MRN: [id number] DOB: 09/02/1940 Today's Date: 05/29/2023   History of Present Illness Patient is a 83 year old female with acute on chronic respiratory failure with hypoxia secondary to acute on chronic diastolic CHF. Pt found to have aspiration PNA. History of HTN, hyperlipidemia, respiratory failure on 2 L, PAF, GI bleed, aortic stenosis s/p TAVR,  transient heart block, diabetes mellitus type II, CAD, hypothyroidism, GERD    PT Comments  Patient received in bed, she is agreeable to PT session. Reports she is feeling better today. Received on 5 liters via HFNC. She required min A to perform supine to sit. Able to stand with supervision. Ambulated 25 feet x 3 reps with rollator and cga. Patient required seated rest between reps due to low O2 sats and fatigue. She will continue to benefit from skilled PT to improve functional independence, endurance, strength and safety.     If plan is discharge home, recommend the following: A lot of help with walking and/or transfers;A lot of help with bathing/dressing/bathroom;Assistance with cooking/housework;Assist for transportation;Help with stairs or ramp for entrance   Can travel by private vehicle     No  Equipment Recommendations  Other (comment) (TBD next venue)    Recommendations for Other Services       Precautions / Restrictions Precautions Precautions: Fall Recall of Precautions/Restrictions: Intact Precaution/Restrictions Comments: watch SpO2 Restrictions Weight Bearing Restrictions Per Provider Order: No     Mobility  Bed Mobility Overal bed mobility: Needs Assistance Bed Mobility: Supine to Sit     Supine to sit: Min assist     General bed mobility comments: Min A needed to scoot forward to edge of bed and min A to raise trunk    Transfers Overall transfer level: Modified independent Equipment used: Rolling walker (2 wheels) Transfers: Sit to/from  Stand Sit to Stand: Modified independent (Device/Increase time)                Ambulation/Gait Ambulation/Gait assistance: Contact guard assist Gait Distance (Feet): 25 Feet Assistive device: Rolling walker (2 wheels) Gait Pattern/deviations: Step-through pattern, Decreased step length - right, Decreased step length - left, Trunk flexed Gait velocity: decreased     General Gait Details: slow pace, flexed posture, cues to stay within rollator during turns. She ambulated 15 feet x 3 reps with seated rest between. O2 down to 81% on 6 liters last rep. Cues needed for pursed lip breathing.   Stairs             Wheelchair Mobility     Tilt Bed    Modified Rankin (Stroke Patients Only)       Balance Overall balance assessment: Needs assistance Sitting-balance support: Feet supported Sitting balance-[name]: Normal     Standing balance support: Bilateral upper extremity supported, During functional activity, Reliant on assistive device for balance Standing balance-[name]: Fair Standing balance comment: Pt dependent on BUE support during OOB mobility and relies on rollator.                            Communication Communication Communication: Impaired Factors Affecting Communication: Hearing impaired  Cognition Arousal: Alert Behavior During Therapy: WFL for tasks assessed/performed   PT - Cognitive impairments: No apparent impairments                         Following commands: Intact  Cueing Cueing Techniques: Verbal cues  Exercises      General Comments        Pertinent Vitals/Pain Pain Assessment Pain Assessment: No/denies pain    Home Living                          Prior Function            PT Goals (current goals can now be found in the care plan section) Acute Rehab PT Goals Patient Stated Goal: Get better PT Goal Formulation: With patient Time For Goal Achievement: 06/06/23 Potential to  Achieve Goals: Good Progress towards PT goals: Progressing toward goals    Frequency    Min 2X/week      PT Plan      Co-evaluation              AM-PAC PT "6 Clicks" Mobility   Outcome Measure  Help needed turning from your back to your side while in a flat bed without using bedrails?: A Little Help needed moving from lying on your back to sitting on the side of a flat bed without using bedrails?: A Little Help needed moving to and from a bed to a chair (including a wheelchair)?: A Little Help needed standing up from a chair using your arms (e.g., wheelchair or bedside chair)?: A Little Help needed to walk in hospital room?: A Lot Help needed climbing 3-5 steps with a railing? : Total 6 Click Score: 15    End of Session Equipment Utilized During Treatment: Oxygen  Activity Tolerance: Patient limited by fatigue;Other (comment) (O2 sats) Patient left: in chair;with call bell/phone within reach Nurse Communication: Mobility status PT Visit Diagnosis: Other abnormalities of gait and mobility (R26.89);Muscle weakness (generalized) (M62.81);Difficulty in walking, not elsewhere classified (R26.2)     Time: 1610-9604 PT Time Calculation (min) (ACUTE ONLY): 27 min  Charges:    $Gait Training: 23-37 mins PT General Charges $$ ACUTE PT VISIT: 1 Visit                     Mysha Peeler, PT, GCS 05/29/23,11:37 AM

## 2023-05-30 DIAGNOSIS — I5033 Acute on chronic diastolic (congestive) heart failure: Secondary | ICD-10-CM | POA: Diagnosis not present

## 2023-05-30 LAB — GLUCOSE, CAPILLARY
Glucose-Capillary: 133 mg/dL — ABNORMAL HIGH (ref 70–99)
Glucose-Capillary: 243 mg/dL — ABNORMAL HIGH (ref 70–99)
Glucose-Capillary: 277 mg/dL — ABNORMAL HIGH (ref 70–99)
Glucose-Capillary: 237 mg/dL — ABNORMAL HIGH (ref 70–99)
Glucose-Capillary: 190 mg/dL — ABNORMAL HIGH (ref 70–99)

## 2023-05-30 LAB — COMPREHENSIVE METABOLIC PANEL WITH GFR
ALT: 86 U/L — ABNORMAL HIGH (ref 0–44)
AST: 97 U/L — ABNORMAL HIGH (ref 15–41)
Albumin: 2.1 g/dL — ABNORMAL LOW (ref 3.5–5.0)
Alkaline Phosphatase: 66 U/L (ref 38–126)
Anion gap: 9 (ref 5–15)
BUN: 73 mg/dL — ABNORMAL HIGH (ref 8–23)
CO2: 30 mmol/L (ref 22–32)
Calcium: 8.8 mg/dL — ABNORMAL LOW (ref 8.9–10.3)
Chloride: 95 mmol/L — ABNORMAL LOW (ref 98–111)
Creatinine, Ser: 2.11 mg/dL — ABNORMAL HIGH (ref 0.44–1.00)
GFR, Estimated: 23 mL/min — ABNORMAL LOW (ref >60)
Glucose, Bld: 112 mg/dL — ABNORMAL HIGH (ref 70–99)
Potassium: 4.2 mmol/L (ref 3.5–5.1)
Sodium: 134 mmol/L — ABNORMAL LOW (ref 135–145)
Total Bilirubin: 0.9 mg/dL (ref 0.0–1.2)
Total Protein: 6.1 g/dL — ABNORMAL LOW (ref 6.5–8.1)

## 2023-05-30 LAB — MAGNESIUM: Magnesium: 2.5 mg/dL — ABNORMAL HIGH (ref 1.7–2.4)

## 2023-05-30 LAB — CBC
HCT: 26.5 % — ABNORMAL LOW (ref 36.0–46.0)
Hemoglobin: 8.4 g/dL — ABNORMAL LOW (ref 12.0–15.0)
MCH: 29.3 pg (ref 26.0–34.0)
MCHC: 31.7 g/dL (ref 30.0–36.0)
MCV: 92.3 fL (ref 80.0–100.0)
Platelets: 219 10*3/uL (ref 150–400)
RBC: 2.87 MIL/uL — ABNORMAL LOW (ref 3.87–5.11)
RDW: 17 % — ABNORMAL HIGH (ref 11.5–15.5)
WBC: 13 10*3/uL — ABNORMAL HIGH (ref 4.0–10.5)
nRBC: 0 % (ref 0.0–0.2)

## 2023-05-30 MED ORDER — FUROSEMIDE 10 MG/ML IJ SOLN
40.0000 mg | Freq: Once | INTRAMUSCULAR | Status: AC
  Administered 2023-05-30: 40 mg via INTRAVENOUS
  Filled 2023-05-30: qty 4

## 2023-05-30 MED ORDER — INSULIN ASPART 100 UNIT/ML IJ SOLN
3.0000 [IU] | Freq: Three times a day (TID) | INTRAMUSCULAR | Status: DC
  Administered 2023-05-30 – 2023-05-31 (×3): 3 [IU] via SUBCUTANEOUS

## 2023-05-30 MED ORDER — INSULIN GLARGINE-YFGN 100 UNIT/ML ~~LOC~~ SOLN
25.0000 [IU] | Freq: Every day | SUBCUTANEOUS | Status: DC
  Administered 2023-05-31: 25 [IU] via SUBCUTANEOUS
  Filled 2023-05-30: qty 0.25

## 2023-05-30 NOTE — Progress Notes (Signed)
 Rounding Note    Patient Name: Mary Mccarthy Date of Encounter: 05/30/2023  [facility] Cardiologist: Randene Bustard, MD   Subjective   She feels a bit more SOB today.  Inpatient Medications    Scheduled Meds:  amLODipine   2.5 mg Oral Daily   atorvastatin   40 mg Oral Daily   enoxaparin  (LOVENOX ) injection  30 mg Subcutaneous Q24H   feeding supplement  237 mL Oral BID BM   fesoterodine   4 mg Oral Daily   fluticasone   2 spray Each Nare BID   hydrALAZINE   75 mg Oral Q8H   insulin  aspart  0-15 Units Subcutaneous TID WC   insulin  glargine-yfgn  20 Units Subcutaneous Daily   ipratropium-albuterol   3 mL Nebulization BID   isosorbide  mononitrate  60 mg Oral Daily   lactulose   20 g Oral BID   levothyroxine   25 mcg Oral Daily   metoprolol  succinate  12.5 mg Oral Daily   mirtazapine   15 mg Oral QHS   pantoprazole   40 mg Oral BID   predniSONE   40 mg Oral Q breakfast   sodium chloride  flush  3 mL Intravenous Q12H   Continuous Infusions:  PRN Meds: acetaminophen , albuterol , ALPRAZolam , guaiFENesin -dextromethorphan , sodium chloride  flush, trimethobenzamide    Vital Signs    Vitals:   05/29/23 1918 05/30/23 0009 05/30/23 0328 05/30/23 0735  BP: (!) 139/45 (!) 147/50 (!) 141/45   Pulse: 61 65 71   Resp: 20 20 (!) 22 20  Temp: 97.7 F (36.5 C) 98.5 F (36.9 C) 98.5 F (36.9 C)   TempSrc: Oral Oral Oral Oral  SpO2: 100% 92% 92% 92%  Weight:   82.8 kg   Height:        Intake/Output Summary (Last 24 hours) at 05/30/2023 0915 Last data filed at 05/30/2023 0841 Gross per 24 hour  Intake 300 ml  Output 1350 ml  Net -1050 ml      05/30/2023    3:28 AM 05/29/2023    4:50 AM 05/28/2023   12:39 AM  Last 3 Weights  Weight (lbs) 182 lb 8.7 oz 181 lb 180 lb 1.9 oz  Weight (kg) 82.8 kg 82.1 kg 81.7 kg      Telemetry    Sinus rhythm HR 60s - Personally Reviewed  ECG    No new tracings - Personally Reviewed  Physical Exam   GEN: No acute distress.    Neck: No JVD Cardiac: RRR, 3/6 systolic murmur at left sternal border Respiratory: crackles L > R GI: Soft, nontender, non-distended  MS: No edema; No deformity. Neuro:  Nonfocal  Psych: Normal affect   Labs    High Sensitivity Troponin:   Recent Labs  Lab 05/14/23 0922 05/14/23 1340 05/27/23 0404  TROPONINIHS 35* 31* 16     Chemistry Recent Labs  Lab 05/27/23 0404 05/28/23 0335 05/29/23 0812 05/30/23 0315  NA 128* 132* 134* 134*  K 4.3 4.4 4.2 4.2  CL 87* 91* 94* 95*  CO2 29 30 30 30   GLUCOSE 245* 232* 204* 112*  BUN 71* 80* 75* 73*  CREATININE 3.09* 3.05* 2.20* 2.11*  CALCIUM  8.3* 8.8* 9.0 8.8*  MG 2.2 2.4  --  2.5*  PROT  --   --   --  6.1*  ALBUMIN  --   --   --  2.1*  AST  --   --   --  97*  ALT  --   --   --  86*  ALKPHOS  --   --   --  66  BILITOT  --   --   --  0.9  GFRNONAA 14* 15* 22* 23*  ANIONGAP 12 11 10 9     Lipids No results for input(s): "CHOL", "TRIG", "HDL", "LABVLDL", "LDLCALC", "CHOLHDL" in the last 168 hours.  Hematology Recent Labs  Lab 05/30/23 0315  WBC 13.0*  RBC 2.87*  HGB 8.4*  HCT 26.5*  MCV 92.3  MCH 29.3  MCHC 31.7  RDW 17.0*  PLT 219   Thyroid  No results for input(s): "TSH", "FREET4" in the last 168 hours.  BNP Recent Labs  Lab 05/29/23 0449  BNP 591.7*    DDimer No results for input(s): "DDIMER" in the last 168 hours.   Radiology    No results found.  Cardiac Studies   TTE 05/15/2023  1. Left ventricular ejection fraction, by estimation, is 60 to 65%. The  left ventricle has normal function. The left ventricle has no regional  wall motion abnormalities. There is severe asymmetric left ventricular  hypertrophy of the septal segment. Left   ventricular diastolic function could not be evaluated.   2. Right ventricular systolic function is normal. The right ventricular  size is mildly enlarged. There is moderately elevated pulmonary artery  systolic pressure. The estimated right ventricular systolic pressure  is  53.7 mmHg.   3. Left atrial size was mildly dilated.   4. Right atrial size was mildly dilated.   5. The mitral valve is degenerative. Trivial mitral valve regurgitation.  Mild mitral stenosis. The mean mitral valve gradient is 3.0 mmHg. Severe  mitral annular calcification.   6. Tricuspid valve regurgitation is mild to moderate.   7. Mild paravalvular leak adjacent to the intervalvular fibrosa. The  aortic valve has been repaired/replaced. Aortic valve regurgitation is  mild. No aortic stenosis is present. There is a 26 mm Medtronic stented  (TAVR) valve present in the aortic  position. Procedure Date: 10/26/2020. Echo findings are consistent with  normal structure and function of the aortic valve prosthesis. Echo  findings are consistent with perivalvular leak of the aortic prosthesis.  Aortic regurgitation PHT measures 514 msec.   Aortic valve mean gradient measures 9.0 mmHg. Aortic valve Vmax measures  1.88 m/s.   8. The inferior vena cava is dilated in size with <50% respiratory  variability, suggesting right atrial pressure of 15 mmHg.   Patient Profile     83 y.o. female 83 y.o. female with persistent atrial fibrillation, HFpEF, aortic stenosis status post TAVR, diabetes, obesity who was admitted on 05/14/2023 for acute on chronic diastolic heart failure.   Assessment & Plan    Acute on chronic diastolic heart failure CKD - has diuresed with IV lasix , now on hold  - appears mildly volume up today - given ongoing steroids, can consider targeted doses of lasix  - crackles L > R, she feels her breathing is a bit more labored today - sCr 2.11 - baseline 1.5 - consider 40 mg IV lasix  x 1 dose   Amiodarone  lung toxicity - discontinued amiodarone  and initiated corticosteroids - plan for slow taper per PCCM for 204 weeks with close follow up with pulmonology   Persistent atrial fibrillation - discontinue amiodarone  as above - telemetry with sinus rhythm   Acute hypoxic  respiratory failure - multifactorial with amiodarone  lung toxicity, PNA/aspiration PNA, and HFpEF - she remains on    Hypertension - 2.5 mg amlodipine , 75 mg hydralazine  TID, 60 mg imdur , and 12.5 mg toprol       For questions or updates,  please contact [facility] Please consult [email or web address] for contact info under        Signed, Lamond Pilot, PA  05/30/2023, 9:15 AM

## 2023-05-30 NOTE — Plan of Care (Signed)
  Problem: Education: Goal: Knowledge of General Education information will improve Description: Including pain rating scale, medication(s)/side effects and non-pharmacologic comfort measures Outcome: Progressing   Problem: Safety: Goal: Ability to remain free from injury will improve Outcome: Progressing   Problem: Health Behavior/Discharge Planning: Goal: Ability to manage health-related needs will improve Outcome: Not Progressing   Problem: Clinical Measurements: Goal: Respiratory complications will improve Outcome: Not Progressing   Problem: Activity: Goal: Risk for activity intolerance will decrease Outcome: Not Progressing   Problem: Nutritional: Goal: Progress toward achieving an optimal weight will improve Outcome: Not Progressing

## 2023-05-30 NOTE — Plan of Care (Signed)
   Problem: Education: Goal: Knowledge of General Education information will improve Description Including pain rating scale, medication(s)/side effects and non-pharmacologic comfort measures Outcome: Progressing   Problem: Activity: Goal: Risk for activity intolerance will decrease Outcome: Progressing   Problem: Safety: Goal: Ability to remain free from injury will improve Outcome: Progressing

## 2023-05-30 NOTE — Progress Notes (Signed)
 PROGRESS NOTE    Mary Mccarthy  ZOX:[id number] DOB: [birth year] DOA: 05/14/2023 PCP: Roslyn Coombe, MD  83 yo female with the past medical history of hypertension, hyperlipidemia, paroxysmal atrial fibrillation, aortic stenosis sp TAVR, T2DM and obesity who presented with dyspnea and lower extremity edema.  Found to have acute on chronic HFpEF with worsening hypoxia secondary to likely amiodarone  toxicity.  - 4/21 pulmonary consulted, amiodarone  discontinued and prednisone  taper over 2 to 4 weeks recommended  Subjective: -Feels better, breathing continues to improve  Assessment and Plan:  Acute hypoxic respiratory failure -Multifactorial in the setting of chronic diastolic CHF, amiodarone  toxicity also suspected -On 2 L O2 at baseline, now on 5 L, wean O2 as tolerated - Increase activity   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  Evaluated by pulmonology, consistent with amiodarone  toxicity.  Discontinued amiodarone , initiated corticosteroids.  Transitioned from IV Solu-Medrol  to prednisone  40 mg daily.  Slow taper over 2-4 weeks per pulmonology.  Follow-up with pulmonology in the outpatient setting to reassess oxygen  needs and repeat chest x-ray.   Acute on chronic HFpEF - Diuresed with IV Lasix , 9.4 L negative - Cards following plan for repeat IV Lasix  today - Creatinine higher, GDMT limited by AKI/CKD   Acute kidney injury on CKD 3B - Baseline creatinine around 1.5, creatinine peaked at 3, now improving, monitor   Persistent atrial fibrillation -Amiodarone  discontinued.  Currently normal sinus rhythm.  -She has declined anticoagulation in the past  Diabetes mellitus - Insulin  glargine 20 units.  Increase insulin  dose, CBGs higher since back on steroids   CAD/hypertension - Continue metoprolol , Imdur , hydralazine , amlodipine .  Statin on board.  Not on aspirin .    Aortic stenosis s/p TAVR - Stable.   Possible aspiration pneumonia - Appears to be ruled out secondary to diagnosis above (amiodarone  toxicity).   Physical debilitation muscle weakness - Exacerbated by hypoxia and deconditioning.  Evaluated by PT/OT.  Currently working with [facility] on SNF placement.    DVT prophylaxis: Lovenox  Code Status: DNR Family Communication: None present Disposition Plan: Declines SNF, home likely 48 hours  Consultants:    Procedures:   Antimicrobials:    Objective: Vitals:   05/29/23 1918 05/30/23 0009 05/30/23 0328 05/30/23 0735  BP: (!) 139/45 (!) 147/50 (!) 141/45 130/83  Pulse: 61 65 71 66  Resp: 20 20 (!) 22 20  Temp: 97.7 F (36.5 C) 98.5 F (36.9 C) 98.5 F (36.9 C)   TempSrc: Oral Oral Oral Oral  SpO2: 100% 92% 92% 92%  Weight:   82.8 kg   Height:        Intake/Output Summary (Last 24 hours) at 05/30/2023 1138 Last data filed at 05/30/2023 0841 Gross per 24 hour  Intake 300 ml  Output 1350 ml  Net -1050 ml   Filed Weights   05/28/23 0039 05/29/23 0450 05/30/23 0328  Weight: 81.7 kg (P) 82.1 kg 82.8 kg    Examination:  General exam: Appears calm and comfortable  Respiratory system: Few basilar Rales Cardiovascular system: S1 & S2 heard, RRR.  Abd: nondistended, soft and nontender.Normal bowel sounds heard. Central nervous system: Alert and oriented. No focal neurological deficits. Extremities: Trace edema Skin: No rashes Psychiatry:  Mood & affect appropriate.     Data Reviewed:   CBC: Recent Labs  Lab 05/30/23 0315  WBC 13.0*  HGB 8.4*  HCT 26.5*  MCV 92.3  PLT 219   Basic Metabolic Panel: Recent  Labs  Lab 05/25/23 0750 05/26/23 1425 05/27/23 0404 05/28/23 0335 05/29/23 0812 05/30/23 0315  NA 130* 130* 128* 132* 134* 134*  K 4.4 4.6 4.3 4.4 4.2 4.2  CL 89* 90* 87* 91* 94* 95*  CO2 33* 31 29 30 30 30   GLUCOSE 175* 225* 245* 232* 204* 112*  BUN 32* 62* 71* 80* 75* 73*  CREATININE 1.72* 3.04* 3.09*  3.05* 2.20* 2.11*  CALCIUM  8.7* 8.6* 8.3* 8.8* 9.0 8.8*  MG 2.2 2.3 2.2 2.4  --  2.5*   GFR: Estimated Creatinine Clearance: 19.4 mL/min (A) (by C-G formula based on SCr of 2.11 mg/dL (H)). Liver Function Tests: Recent Labs  Lab 05/30/23 0315  AST 97*  ALT 86*  ALKPHOS 66  BILITOT 0.9  PROT 6.1*  ALBUMIN 2.1*   No results for input(s): "LIPASE", "AMYLASE" in the last 168 hours. No results for input(s): "AMMONIA" in the last 168 hours. Coagulation Profile: No results for input(s): "INR", "PROTIME" in the last 168 hours. Cardiac Enzymes: No results for input(s): "CKTOTAL", "CKMB", "CKMBINDEX", "TROPONINI" in the last 168 hours. BNP (last 3 results) Recent Labs    03/14/23 0924  PROBNP 235.0*   HbA1C: No results for input(s): "HGBA1C" in the last 72 hours. CBG: Recent Labs  Lab 05/29/23 1201 05/29/23 1553 05/29/23 2111 05/30/23 0611 05/30/23 1015  GLUCAP 320* 234* 130* 133* 243*   Lipid Profile: No results for input(s): "CHOL", "HDL", "LDLCALC", "TRIG", "CHOLHDL", "LDLDIRECT" in the last 72 hours. Thyroid  Function Tests: No results for input(s): "TSH", "T4TOTAL", "FREET4", "T3FREE", "THYROIDAB" in the last 72 hours. Anemia Panel: No results for input(s): "VITAMINB12", "FOLATE", "FERRITIN", "TIBC", "IRON ", "RETICCTPCT" in the last 72 hours. Urine analysis:    Component Value Date/Time   COLORURINE YELLOW 05/14/2023 1148   APPEARANCEUR TURBID (A) 05/14/2023 1148   LABSPEC 1.014 05/14/2023 1148   PHURINE 5.0 05/14/2023 1148   GLUCOSEU NEGATIVE 05/14/2023 1148   GLUCOSEU NEGATIVE 03/15/2022 1217   HGBUR NEGATIVE 05/14/2023 1148   BILIRUBINUR NEGATIVE 05/14/2023 1148   KETONESUR NEGATIVE 05/14/2023 1148   PROTEINUR 30 (A) 05/14/2023 1148   UROBILINOGEN 0.2 03/15/2022 1217   NITRITE NEGATIVE 05/14/2023 1148   LEUKOCYTESUR NEGATIVE 05/14/2023 1148   Sepsis Labs: @LABRCNTIP (procalcitonin:4,lacticidven:4)  )No results found for this or any previous visit (from  the past 240 hours).   Radiology Studies: No results found.   Scheduled Meds:  amLODipine   2.5 mg Oral Daily   atorvastatin   40 mg Oral Daily   enoxaparin  (LOVENOX ) injection  30 mg Subcutaneous Q24H   feeding supplement  237 mL Oral BID BM   fesoterodine   4 mg Oral Daily   fluticasone   2 spray Each Nare BID   hydrALAZINE   75 mg Oral Q8H   insulin  aspart  0-15 Units Subcutaneous TID WC   insulin  glargine-yfgn  20 Units Subcutaneous Daily   ipratropium-albuterol   3 mL Nebulization BID   isosorbide  mononitrate  60 mg Oral Daily   lactulose   20 g Oral BID   levothyroxine   25 mcg Oral Daily   metoprolol  succinate  12.5 mg Oral Daily   mirtazapine   15 mg Oral QHS   pantoprazole   40 mg Oral BID   predniSONE   40 mg Oral Q breakfast   sodium chloride  flush  3 mL Intravenous Q12H   Continuous Infusions:   LOS: 16 days    Time spent:    Deforest Fast, MD [facility]   05/30/2023, 11:38 AM

## 2023-05-30 NOTE — TOC Progression Note (Signed)
 Transition of Care [facility]) - Progression Note    Patient Details  Name: Mary Mccarthy MRN: [id number] Date of Birth: Dec 19, 1940  [facility] CM/SW Contact  Katrinka Parr, [place] Phone Number: 05/30/2023, 3:50 PM  Clinical Narrative:     CSW met with pt to discuss SNF choice. Pt states she is unsure of SNF choice. She states her son is coming later this evening and she will talk to him about choice. CSW inquired if she talked to him on 4/21 as it is documented she said she would. Pt states she did talk with him but they did not come to a decision. CSW explained a SNF choice would be need by tomorrow as pt is anticipated to DC as early as 48 hours.   Expected Discharge Plan: Skilled Nursing Facility Barriers to Discharge: English as a second language teacher, Continued Medical Work up  Expected Discharge Plan and Services     Post Acute Care Choice: Skilled Nursing Facility Living arrangements for the past 2 months: Single Family Home                                       Social Determinants of Health (SDOH) Interventions SDOH Screenings   Food Insecurity: No Food Insecurity (05/14/2023)  Recent Concern: Food Insecurity - Food Insecurity Present (03/01/2023)  Housing: Low Risk  (05/17/2023)  Recent Concern: Housing - High Risk (03/05/2023)  Transportation Needs: No Transportation Needs (05/17/2023)  Utilities: At Risk (05/14/2023)  Alcohol Screen: Low Risk  (05/17/2023)  Depression (PHQ2-9): Low Risk  (03/14/2023)  Financial Resource Strain: Low Risk  (05/17/2023)  Physical Activity: Inactive (05/11/2022)  Social Connections: Socially Isolated (05/14/2023)  Stress: No Stress Concern Present (05/11/2022)  Tobacco Use: Low Risk  (05/14/2023)    Readmission Risk Interventions    11/04/2020   11:24 AM  Readmission Risk Prevention Plan  Transportation Screening Complete  PCP or Specialist Appt within 3-5 Days Complete  HRI or Home Care Consult Complete  Social Work Consult for  Recovery Care Planning/Counseling Not Complete  Palliative Care Screening Not Applicable  Medication Review Ocean[employer]) Complete

## 2023-05-30 NOTE — Progress Notes (Signed)
 Occupational Therapy Treatment Patient Details Name: Mary Mccarthy MRN: [id number] DOB: [birth year] Today's Date: 05/30/2023   History of present illness Patient is a 83 year old female with acute on chronic respiratory failure with hypoxia secondary to acute on chronic diastolic CHF. Pt found to have aspiration PNA. History of HTN, hyperlipidemia, respiratory failure on 2 L, PAF, GI bleed, aortic stenosis s/p TAVR,  transient heart block, diabetes mellitus type II, CAD, hypothyroidism, GERD   OT comments  Pt presented in bed and per nursing reported had a moment of weakness when attempting to transfer from [facility]. At this time she was able to complete supine to sitting with min assist but mod assist to bring hips to EOB to prepare for sitting. She then was able to complete transfer to chair with CGA-min assist and then completed UE bathing post set up  and LE bathing with mod to max assist. Patient will benefit from continued inpatient follow up therapy, <3 hours/day.       If plan is discharge home, recommend the following:  Help with stairs or ramp for entrance;A little help with bathing/dressing/bathroom;A little help with walking and/or transfers;Assistance with cooking/housework;Assist for transportation   Equipment Recommendations  None recommended by OT    Recommendations for Other Services      Precautions / Restrictions Precautions Precautions: Fall Recall of Precautions/Restrictions: Intact Precaution/Restrictions Comments: watch SpO2 Restrictions Weight Bearing Restrictions Per Provider Order: No       Mobility Bed Mobility Overal bed mobility: Needs Assistance Bed Mobility: Supine to Sit     Supine to sit: Min assist     General bed mobility comments: moderate assist once in sitting at EOB to bring hips foward    Transfers Overall transfer level: Needs assistance Equipment used: Rolling walker (2 wheels) Transfers: Sit to/from Stand Sit to Stand:  Contact guard assist     Step pivot transfers: Contact guard assist           Balance Overall balance assessment: Needs assistance Sitting-balance support: Feet supported Sitting balance-[name]: Normal Sitting balance - Comments: Pt sat EOB with supervision.   Standing balance support: Bilateral upper extremity supported Standing balance-[name]: Poor                             ADL either performed or assessed with clinical judgement   ADL Overall ADL's : Needs assistance/impaired Eating/Feeding: Set up;Bed level   Grooming: Wash/dry hands;Wash/dry face;Set up;Sitting   Upper Body Bathing: Set up;Sitting   Lower Body Bathing: Moderate assistance;Sitting/lateral leans;Maximal assistance   Upper Body Dressing : Set up;Sitting   Lower Body Dressing: Moderate assistance;Sit to/from stand   Toilet Transfer: Minimal assistance;Cueing for safety;Cueing for sequencing;Ambulation   Toileting- Clothing Manipulation and Hygiene: Total assistance;Sit to/from stand       Functional mobility during ADLs: Minimal assistance;Contact guard assist;Rolling walker (2 wheels);Cueing for sequencing;Cueing for safety      Extremity/Trunk Assessment Upper Extremity Assessment Upper Extremity Assessment: Generalized weakness   Lower Extremity Assessment Lower Extremity Assessment: Defer to PT evaluation        Vision       Perception     Praxis     Communication Communication Communication: Impaired Factors Affecting Communication: Hearing impaired   Cognition Arousal: Alert Behavior During Therapy: WFL for tasks assessed/performed Cognition: No apparent impairments  Following commands: Intact        Cueing   Cueing Techniques: Verbal cues  Exercises Exercises: Other exercises Other Exercises Other Exercises: reviewed flutter valve and IS with pt able to demosntrate use of both    Shoulder  Instructions       General Comments Pt was on 5L in the beginnging of session and then bumped up to 6L with activity with o2 stats going into 85% but with pacing and breathing techniques was able to recover. Pt then was left back at 5L via [place].    Pertinent Vitals/ Pain       Pain Assessment Pain Assessment: No/denies pain  Home Living                                          Prior Functioning/Environment              Frequency  Min 2X/week        Progress Toward Goals  OT Goals(current goals can now be found in the care plan section)  Progress towards OT goals: Progressing toward goals  Acute Rehab OT Goals Patient Stated Goal: to get better OT Goal Formulation: With patient Time For Goal Achievement: 06/20/23 Potential to Achieve Goals: Good ADL Goals Pt Will Perform Grooming: with modified independence;standing Pt Will Perform Lower Body Dressing: with modified independence;sit to/from stand Pt Will Transfer to Toilet: with modified independence;regular height toilet;ambulating Pt/caregiver will Perform Home Exercise Program: Increased strength;Both right and left upper extremity;With Supervision  Plan      Co-evaluation                 AM-PAC OT "6 Clicks" Daily Activity     Outcome Measure   Help from another person eating meals?: None Help from another person taking care of personal grooming?: A Little Help from another person toileting, which includes using toliet, bedpan, or urinal?: Total Help from another person bathing (including washing, rinsing, drying)?: A Lot Help from another person to put on and taking off regular upper body clothing?: A Little Help from another person to put on and taking off regular lower body clothing?: A Lot 6 Click Score: 15    End of Session Equipment Utilized During Treatment: Rolling walker (2 wheels);Oxygen   OT Visit Diagnosis: Unsteadiness on feet (R26.81);Muscle weakness (generalized)  (M62.81)   Activity Tolerance Patient tolerated treatment well   Patient Left in chair;with call bell/phone within reach;with chair alarm set   Nurse Communication Mobility status        Time: 4098-1191 OT Time Calculation (min): 33 min  Charges: OT General Charges $OT Visit: 1 Visit OT Treatments $Self Care/Home Management : 23-37 mins  Erving Heather OTR/L  Acute Rehab Services  [phone] office number   Stevphen Elders 05/30/2023, 9:03 AM

## 2023-05-30 NOTE — Progress Notes (Signed)
 Heart Failure Stewardship Pharmacist Progress Note   PCP: Roslyn Coombe, MD PCP-Cardiologist: Randene Bustard, MD    HPI:  83 yo F with PMH of HTN, HLD, T2DM, CKD3, CAD, paroxysmal afib, recurrent GI bleed, severe aortic stenosis s/p TAVR complicated with transient heart block, respiratory failure on 2L [place] at [place], hypothyroidism, GERD   Patient previously admitted 02/2023 for CHF exacerbation. Patient was diuresed during admission and home lasix  was increased from prn to daily.   Presented to the ED on 4/7 with worsening SOB with exertion and leg swelling over the last 2 weeks. Also presented with productive cough, chest pain/tightness, NVD, and wheezing. Reports PCP instructed her to take an additional dose of 20mg  lasix  in the setting of weight gain of 3 lb - no improvement per pt. Endorses right flank pain 2/2 coughing.   In the ED, patient was hypertensive and hypoxic (did not present on her home O2) wheezing, rhonchi, rales and BL LE edema present on exam. Slight murmur present. JVD Patient diuresed with lasix  40mg  x2 in the ED. CXR showed mild to moderate pulmonary vascular congestion. No dense consolidation or lung collapse. ECHO 4/8 showed LVEF 60-65% (unchanged from prior 11/2022) with normal LV function. Severe asymmetric LV hypertrophy of septal segment. LV diastolic function could not be evaluated. Normal RV function. Mildly enlarged LV. MV is degenerative with trivial regurgitation. Mild stenosis and severe annular calcifications also present. Mild to moderate tricuspid valve regurgitation. Cardiology was consulted, planning with diuresis for now. Repeat CXR 4/17 showing mild decrease in patchy multifocal airspace a nodular airspace. Opacities and cardiomegaly. Repeat CXR on 4/21 with coarse and asymmetric bilateral opacity greater on the left not significantly changed from 4/17, favor bilateral infection rather than edema. CT chest on 4/20 with diffuse multifocal patchy airspace and  ground glass opacities throughout both lungs. Findings most consistent with multifocal pneumonia. PCCM consulted and believe this is most consistent with amiodarone  toxicity - she is now improving off amiodarone  and on steroids.   States she is feeling a bit worse today. Was up all night coughing. Still requiring 5L HFNC. Trace LE edema on exam.   Current HF Medications: Beta Blocker: metoprolol  succinate 12.5 mg daily  Other: isosorbide  mononitrate 60 mg daily, hydralazine  75 mg q8h  Prior to admission HF Medications: Diuretic: Lasix  60mg  daily Beta blocker: Metoprolol  succinate 12.5mg  daily  Other: Isosorbide  mononitrate 15mg  daily, hydralazine  25mg  q8h  Pertinent Lab Values: Serum creatinine 2.11 (BL 1.3-1.5), BUN 73, Potassium 4.2, Sodium 134, BNP 977.8, Magnesium  2.5, A1c 6.5   Vital Signs: Weight: 182 lbs (admission weight: 191 lbs) - bed weights Blood pressure: 140/40s Heart rate: 60s I/O: net -1.2L yesterday; net -11.4L since admission 5L HFNC  Medication Assistance / Insurance Benefits Check: Does the patient have prescription insurance?  Yes Type of insurance plan: Medicare  Outpatient Pharmacy:  Prior to admission outpatient pharmacy: [facility]  Is the patient willing to use [facility] TOC pharmacy at discharge? Yes Is the patient willing to transition their outpatient pharmacy to utilize a [facility] outpatient pharmacy? No    Assessment: 1. Acute on chronic diastolic CHF (LVEF 60-65%). NYHA class 2-3 symptoms.Strict I/Os and daily weights. Keep K>4 and Mg>2. Overall oxygen  status not at baseline - amiodarone  toxicity per PCCM.  - Have been holding diuretics. Seems to be retaining some fluid, consider IV lasix  today. Creatinine improved further today.  - Continue hydralazine  75 mg q8h and Imdur  60 mg daily - Agree with deferring SGLT2i  given hx of 3 UTI in prior 1-2 months  - Continue metoprolol  succinate 12.5 mg daily - HR stable 50-70s.   Plan: 1) Medication  changes recommended at this time: - Restart IV lasix    2) Patient assistance: - Contacted son and he is interested in utilizing [facility] for medication changes upon discharge  but would [facility] and remaining prescriptions  - Son reported that current medications are affordable  3)  Education  - Patient has been educated on current HF medications and potential additions to HF medication regimen - Patient verbalizes understanding that over the next few months, these medication doses may change and more medications may be added to optimize HF regimen - Patient has been educated on basic disease state pathophysiology and goals of therapy   Jerilyn Monte, PharmD, BCPS Heart Failure Stewardship Pharmacist Phone [phone]

## 2023-05-31 DIAGNOSIS — T462X5A Adverse effect of other antidysrhythmic drugs, initial encounter: Secondary | ICD-10-CM

## 2023-05-31 DIAGNOSIS — I5033 Acute on chronic diastolic (congestive) heart failure: Secondary | ICD-10-CM | POA: Diagnosis not present

## 2023-05-31 DIAGNOSIS — J984 Other disorders of lung: Secondary | ICD-10-CM | POA: Diagnosis not present

## 2023-05-31 LAB — BASIC METABOLIC PANEL WITH GFR
Anion gap: 9 (ref 5–15)
BUN: 72 mg/dL — ABNORMAL HIGH (ref 8–23)
CO2: 30 mmol/L (ref 22–32)
Calcium: 8.4 mg/dL — ABNORMAL LOW (ref 8.9–10.3)
Chloride: 94 mmol/L — ABNORMAL LOW (ref 98–111)
Creatinine, Ser: 2.01 mg/dL — ABNORMAL HIGH (ref 0.44–1.00)
GFR, Estimated: 24 mL/min — ABNORMAL LOW (ref >60)
Glucose, Bld: 177 mg/dL — ABNORMAL HIGH (ref 70–99)
Potassium: 4.2 mmol/L (ref 3.5–5.1)
Sodium: 133 mmol/L — ABNORMAL LOW (ref 135–145)

## 2023-05-31 LAB — CBC
HCT: 24.8 % — ABNORMAL LOW (ref 36.0–46.0)
Hemoglobin: 7.9 g/dL — ABNORMAL LOW (ref 12.0–15.0)
MCH: 29.5 pg (ref 26.0–34.0)
MCHC: 31.9 g/dL (ref 30.0–36.0)
MCV: 92.5 fL (ref 80.0–100.0)
Platelets: 179 10*3/uL (ref 150–400)
RBC: 2.68 MIL/uL — ABNORMAL LOW (ref 3.87–5.11)
RDW: 17.2 % — ABNORMAL HIGH (ref 11.5–15.5)
WBC: 9.9 10*3/uL (ref 4.0–10.5)
nRBC: 0 % (ref 0.0–0.2)

## 2023-05-31 LAB — GLUCOSE, CAPILLARY
Glucose-Capillary: 196 mg/dL — ABNORMAL HIGH (ref 70–99)
Glucose-Capillary: 121 mg/dL — ABNORMAL HIGH (ref 70–99)
Glucose-Capillary: 197 mg/dL — ABNORMAL HIGH (ref 70–99)
Glucose-Capillary: 273 mg/dL — ABNORMAL HIGH (ref 70–99)

## 2023-05-31 MED ORDER — INSULIN ASPART 100 UNIT/ML IJ SOLN
5.0000 [IU] | Freq: Three times a day (TID) | INTRAMUSCULAR | Status: DC
  Administered 2023-05-31 – 2023-06-01 (×3): 5 [IU] via SUBCUTANEOUS

## 2023-05-31 MED ORDER — FUROSEMIDE 10 MG/ML IJ SOLN
40.0000 mg | Freq: Every day | INTRAMUSCULAR | Status: AC
  Administered 2023-05-31: 40 mg via INTRAVENOUS
  Filled 2023-05-31 (×2): qty 4

## 2023-05-31 MED ORDER — FUROSEMIDE 10 MG/ML IJ SOLN
40.0000 mg | Freq: Once | INTRAMUSCULAR | Status: AC
  Administered 2023-05-31: 40 mg via INTRAVENOUS
  Filled 2023-05-31: qty 4

## 2023-05-31 MED ORDER — INSULIN GLARGINE-YFGN 100 UNIT/ML ~~LOC~~ SOLN
30.0000 [IU] | Freq: Every day | SUBCUTANEOUS | Status: DC
  Administered 2023-06-01: 30 [IU] via SUBCUTANEOUS
  Filled 2023-05-31: qty 0.3

## 2023-05-31 NOTE — Progress Notes (Signed)
 Heart Failure Stewardship Pharmacist Progress Note   PCP: Roslyn Coombe, MD PCP-Cardiologist: Randene Bustard, MD    HPI:  83 yo F with PMH of HTN, HLD, T2DM, CKD3, CAD, paroxysmal afib, recurrent GI bleed, severe aortic stenosis s/p TAVR complicated with transient heart block, respiratory failure on 2L [place] at [place], hypothyroidism, GERD   Patient previously admitted 02/2023 for CHF exacerbation. Patient was diuresed during admission and home lasix  was increased from prn to daily.   Presented to the ED on 4/7 with worsening SOB with exertion and leg swelling over the last 2 weeks. Also presented with productive cough, chest pain/tightness, NVD, and wheezing. Reports PCP instructed her to take an additional dose of 20mg  lasix  in the setting of weight gain of 3 lb - no improvement per pt. Endorses right flank pain 2/2 coughing.   In the ED, patient was hypertensive and hypoxic (did not present on her home O2) wheezing, rhonchi, rales and BL LE edema present on exam. Slight murmur present. JVD Patient diuresed with lasix  40mg  x2 in the ED. CXR showed mild to moderate pulmonary vascular congestion. No dense consolidation or lung collapse. ECHO 4/8 showed LVEF 60-65% (unchanged from prior 11/2022) with normal LV function. Severe asymmetric LV hypertrophy of septal segment. LV diastolic function could not be evaluated. Normal RV function. Mildly enlarged LV. MV is degenerative with trivial regurgitation. Mild stenosis and severe annular calcifications also present. Mild to moderate tricuspid valve regurgitation. Cardiology was consulted, planning with diuresis for now. Repeat CXR 4/17 showing mild decrease in patchy multifocal airspace a nodular airspace. Opacities and cardiomegaly. Repeat CXR on 4/21 with coarse and asymmetric bilateral opacity greater on the left not significantly changed from 4/17, favor bilateral infection rather than edema. CT chest on 4/20 with diffuse multifocal patchy airspace and  ground glass opacities throughout both lungs. Findings most consistent with multifocal pneumonia. PCCM consulted and believe this is most consistent with amiodarone  toxicity - she is now improving off amiodarone  and on steroids.   Reports she is feeling a little better than yesterday. Feels like the lasix  yesterday did help some. Breathing still seems a bit labored. Trace LE edema on exam. On 5L [place].   Current HF Medications: Beta Blocker: metoprolol  succinate 12.5 mg daily  Other: isosorbide  mononitrate 60 mg daily, hydralazine  75 mg q8h  Prior to admission HF Medications: Diuretic: Lasix  60mg  daily Beta blocker: Metoprolol  succinate 12.5mg  daily  Other: Isosorbide  mononitrate 15mg  daily, hydralazine  25mg  q8h  Pertinent Lab Values: Serum creatinine 2.01 (BL 1.3-1.5), BUN 72, Potassium 4.2, Sodium 133, BNP 977.8, Magnesium  2.5, A1c 6.5   Vital Signs: Weight: 185 lbs (admission weight: 191 lbs) - bed weights Blood pressure: 130-150/40s Heart rate: 50-60s I/O: net -0.9L yesterday; net -12.6L since admission 5L [place]  Medication Assistance / Insurance Benefits Check: Does the patient have prescription insurance?  Yes Type of insurance plan: Medicare  Outpatient Pharmacy:  Prior to admission outpatient pharmacy: [facility]  Is the patient willing to use [facility] TOC pharmacy at discharge? Yes Is the patient willing to transition their outpatient pharmacy to utilize a [facility] outpatient pharmacy? No    Assessment: 1. Acute on chronic diastolic CHF (LVEF 60-65%). NYHA class 2-3 symptoms.Strict I/Os and daily weights. Keep K>4 and Mg>2. Overall oxygen  status not at baseline - amiodarone  toxicity per PCCM.  - S/p 40 mg IV x 1 yesterday. Some improvement. May need a repeat dose today. Creatinine improved further today.  - Continue hydralazine  75 mg q8h  and Imdur  60 mg daily - Agree with deferring SGLT2i given hx of 3 UTI in prior 1-2 months  - Continue metoprolol  succinate 12.5 mg daily -  HR stable 50-70s.   Plan: 1) Medication changes recommended at this time: - Repeat IV lasix  today  2) Patient assistance: - Contacted son and he is interested in utilizing [facility] for medication changes upon discharge  but would [facility] and remaining prescriptions  - Son reported that current medications are affordable  3)  Education  - Patient has been educated on current HF medications and potential additions to HF medication regimen - Patient verbalizes understanding that over the next few months, these medication doses may change and more medications may be added to optimize HF regimen - Patient has been educated on basic disease state pathophysiology and goals of therapy   Jerilyn Monte, PharmD, BCPS Heart Failure Stewardship Pharmacist Phone [phone]

## 2023-05-31 NOTE — Progress Notes (Addendum)
 Rounding Note    Patient Name: Mary Mccarthy Date of Encounter: 05/31/2023  [facility] Cardiologist: Randene Bustard, MD   Subjective   Good output overnight- negative 1.1L, creatinine 2.01 (down form 2.11). Some improvement in breathing, but overall does not feel as well today- chilled, headache.  Inpatient Medications    Scheduled Meds:  amLODipine   2.5 mg Oral Daily   atorvastatin   40 mg Oral Daily   enoxaparin  (LOVENOX ) injection  30 mg Subcutaneous Q24H   feeding supplement  237 mL Oral BID BM   fesoterodine   4 mg Oral Daily   fluticasone   2 spray Each Nare BID   hydrALAZINE   75 mg Oral Q8H   insulin  aspart  0-15 Units Subcutaneous TID WC   insulin  aspart  5 Units Subcutaneous TID WC   [START ON 06/01/2023] insulin  glargine-yfgn  30 Units Subcutaneous Daily   ipratropium-albuterol   3 mL Nebulization BID   isosorbide  mononitrate  60 mg Oral Daily   lactulose   20 g Oral BID   levothyroxine   25 mcg Oral Daily   metoprolol  succinate  12.5 mg Oral Daily   mirtazapine   15 mg Oral QHS   pantoprazole   40 mg Oral BID   predniSONE   40 mg Oral Q breakfast   sodium chloride  flush  3 mL Intravenous Q12H   Continuous Infusions:  PRN Meds: acetaminophen , albuterol , ALPRAZolam , guaiFENesin -dextromethorphan , sodium chloride  flush, trimethobenzamide    Vital Signs    Vitals:   05/31/23 0408 05/31/23 0409 05/31/23 0608 05/31/23 0735  BP:   (!) 146/39 (!) 156/42  Pulse: 64 64  65  Resp: (!) 24 (!) 25  (!) 21  Temp:    97.9 F (36.6 C)  TempSrc:    Oral  SpO2: 96% 96%  94%  Weight:      Height:        Intake/Output Summary (Last 24 hours) at 05/31/2023 0948 Last data filed at 05/31/2023 0603 Gross per 24 hour  Intake 400 ml  Output 1600 ml  Net -1200 ml      05/31/2023    4:07 AM 05/30/2023    3:28 AM 05/29/2023    4:50 AM  Last 3 Weights  Weight (lbs) 185 lb 10 oz 182 lb 8.7 oz 181 lb  Weight (kg) 84.2 kg 82.8 kg 82.1 kg      Telemetry     Sinus rhythm HR 60s - Personally Reviewed  ECG    No new tracings - Personally Reviewed  Physical Exam   GEN: No acute distress.   Neck: No JVD Cardiac: RRR, 3/6 systolic murmur at left sternal border Respiratory: crackles L > R GI: Soft, nontender, non-distended  MS: No edema; No deformity. Neuro:  Nonfocal  Psych: Normal affect   Labs    High Sensitivity Troponin:   Recent Labs  Lab 05/14/23 0922 05/14/23 1340 05/27/23 0404  TROPONINIHS 35* 31* 16     Chemistry Recent Labs  Lab 05/27/23 0404 05/28/23 0335 05/29/23 0812 05/30/23 0315 05/31/23 0256  NA 128* 132* 134* 134* 133*  K 4.3 4.4 4.2 4.2 4.2  CL 87* 91* 94* 95* 94*  CO2 29 30 30 30 30   GLUCOSE 245* 232* 204* 112* 177*  BUN 71* 80* 75* 73* 72*  CREATININE 3.09* 3.05* 2.20* 2.11* 2.01*  CALCIUM  8.3* 8.8* 9.0 8.8* 8.4*  MG 2.2 2.4  --  2.5*  --   PROT  --   --   --  6.1*  --  ALBUMIN  --   --   --  2.1*  --   AST  --   --   --  97*  --   ALT  --   --   --  86*  --   ALKPHOS  --   --   --  66  --   BILITOT  --   --   --  0.9  --   GFRNONAA 14* 15* 22* 23* 24*  ANIONGAP 12 11 10 9 9     Lipids No results for input(s): "CHOL", "TRIG", "HDL", "LABVLDL", "LDLCALC", "CHOLHDL" in the last 168 hours.  Hematology Recent Labs  Lab 05/30/23 0315 05/31/23 0256  WBC 13.0* 9.9  RBC 2.87* 2.68*  HGB 8.4* 7.9*  HCT 26.5* 24.8*  MCV 92.3 92.5  MCH 29.3 29.5  MCHC 31.7 31.9  RDW 17.0* 17.2*  PLT 219 179   Thyroid  No results for input(s): "TSH", "FREET4" in the last 168 hours.  BNP Recent Labs  Lab 05/29/23 0449  BNP 591.7*    DDimer No results for input(s): "DDIMER" in the last 168 hours.   Radiology    No results found.  Cardiac Studies   TTE 05/15/2023  1. Left ventricular ejection fraction, by estimation, is 60 to 65%. The  left ventricle has normal function. The left ventricle has no regional  wall motion abnormalities. There is severe asymmetric left ventricular  hypertrophy of the  septal segment. Left   ventricular diastolic function could not be evaluated.   2. Right ventricular systolic function is normal. The right ventricular  size is mildly enlarged. There is moderately elevated pulmonary artery  systolic pressure. The estimated right ventricular systolic pressure is  53.7 mmHg.   3. Left atrial size was mildly dilated.   4. Right atrial size was mildly dilated.   5. The mitral valve is degenerative. Trivial mitral valve regurgitation.  Mild mitral stenosis. The mean mitral valve gradient is 3.0 mmHg. Severe  mitral annular calcification.   6. Tricuspid valve regurgitation is mild to moderate.   7. Mild paravalvular leak adjacent to the intervalvular fibrosa. The  aortic valve has been repaired/replaced. Aortic valve regurgitation is  mild. No aortic stenosis is present. There is a 26 mm [employer] stented  (TAVR) valve present in the aortic  position. Procedure Date: 10/26/2020. Echo findings are consistent with  normal structure and function of the aortic valve prosthesis. Echo  findings are consistent with perivalvular leak of the aortic prosthesis.  Aortic regurgitation PHT measures 514 msec.   Aortic valve mean gradient measures 9.0 mmHg. Aortic valve Vmax measures  1.88 m/s.   8. The inferior vena cava is dilated in size with <50% respiratory  variability, suggesting right atrial pressure of 15 mmHg.   Patient Profile     83 y.o. female 83 y.o. female with persistent atrial fibrillation, HFpEF, aortic stenosis status post TAVR, diabetes, obesity who was admitted on 05/14/2023 for acute on chronic diastolic heart failure.   Assessment & Plan    Acute on chronic diastolic heart failure CKD - sCr 2.01 (baseline 1.5) - Continue IV lasix  40 mg daily for at least 2 days - monitor creatinine.  Amiodarone  lung toxicity - discontinued amiodarone  and initiated corticosteroids - plan for slow taper per PCCM for 204 weeks with close follow up with  pulmonology  Persistent atrial fibrillation - discontinue amiodarone  as above - telemetry with sinus rhythm  Acute hypoxic respiratory failure - multifactorial with amiodarone  lung toxicity, PNA/aspiration  PNA, and HFpEF  Hypertension - 2.5 mg amlodipine , 75 mg hydralazine  TID, 60 mg imdur , and 12.5 mg toprol   For questions or updates, please contact [facility] Please consult [email or web address] for contact info under   Hazle Lites, MD, [facility], FNLA, FACP  [place]  [facility]  Medical Director of the Advanced Lipid Disorders &  Cardiovascular Risk Reduction Clinic Diplomate of the American Board of Clinical Lipidology Attending Cardiologist  Direct Dial: [phone]  Fax: [phone]  Website:  [email or web address]  Hazle Lites, MD  05/31/2023, 9:48 AM

## 2023-05-31 NOTE — Progress Notes (Signed)
 PROGRESS NOTE    Mary Mccarthy  ZOX:[id number] DOB: February 26, 1940 DOA: 05/14/2023 PCP: Roslyn Coombe, MD  83 yo female with the past medical history of hypertension, hyperlipidemia, paroxysmal atrial fibrillation, aortic stenosis sp TAVR, T2DM and obesity who presented with dyspnea and lower extremity edema.  Found to have acute on chronic HFpEF with worsening hypoxia secondary to likely amiodarone  toxicity.  - 4/21 pulmonary consulted, amiodarone  discontinued and prednisone  taper over 2 to 4 weeks recommended  Subjective: - Walked in the room, desaturated a lot, required over 10 L O2 and over a minute to recover  Assessment and Plan:  Acute hypoxic respiratory failure -Multifactorial in the setting of chronic diastolic CHF, amiodarone  toxicity also suspected -On 2 L O2 at baseline, with significant hypoxia noted with minimal exertion, up to 10 L ambulating in the room today -Encouraged incentive spirometry, flutter valve, repeat diuretics, DuoNebs, remains on prednisone    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  Evaluated by pulmonology, concern for amiodarone  toxicity.  Discontinued amiodarone , initiated corticosteroids.  Transitioned from IV Solu-Medrol  to prednisone  40 mg daily.  Slow taper over 2-4 weeks per pulmonology.  Follow-up with pulmonology in the outpatient setting to reassess oxygen  needs and repeat chest x-ray.   Acute on chronic HFpEF - Diuresed with IV Lasix , 8.4 L negative - Cards following appears overloaded, weight trending up, repeat IV Lasix  today - Creatinine higher, GDMT limited by AKI/CKD   Acute kidney injury on CKD 3B - Baseline creatinine around 1.5, creatinine peaked at 3, now improving, monitor   Persistent atrial fibrillation -Amiodarone  discontinued.  Currently normal sinus rhythm.  -She has declined anticoagulation in the past  Diabetes  mellitus - Increase glargine, meal coverage   CAD/hypertension - Continue metoprolol , Imdur , hydralazine , amlodipine .  Statin on board.  Not on aspirin .   Aortic stenosis s/p TAVR - Stable.   Possible aspiration pneumonia - Appears to be ruled out secondary to diagnosis above (amiodarone  toxicity).   Physical debilitation muscle weakness - Exacerbated by hypoxia and deconditioning.  Evaluated by PT/OT.  [facility] on SNF placement.    DVT prophylaxis: Lovenox  Code Status: DNR Family Communication: None present Disposition Plan: Declines SNF, home likely 48 hours  Consultants:    Procedures:   Antimicrobials:    Objective: Vitals:   05/31/23 0408 05/31/23 0409 05/31/23 0608 05/31/23 0735  BP:   (!) 146/39 (!) 156/42  Pulse: 64 64  65  Resp: (!) 24 (!) 25  (!) 21  Temp:    97.9 F (36.6 C)  TempSrc:    Oral  SpO2: 96% 96%  94%  Weight:      Height:        Intake/Output Summary (Last 24 hours) at 05/31/2023 1113 Last data filed at 05/31/2023 0603 Gross per 24 hour  Intake 400 ml  Output 1600 ml  Net -1200 ml   Filed Weights   05/29/23 0450 05/30/23 0328 05/31/23 0407  Weight: (P) 82.1 kg 82.8 kg 84.2 kg    Examination:  General exam: Appears calm and comfortable  Respiratory system: Few basilar Rales Cardiovascular system: S1 & S2 heard, RRR.  Abd: nondistended, soft and nontender.Normal bowel sounds heard. Central nervous system: Alert and oriented. No focal neurological deficits. Extremities: Trace edema Skin: No rashes Psychiatry:  Mood & affect appropriate.     Data Reviewed:   CBC: Recent Labs  Lab 05/30/23 0315 05/31/23 0256  WBC 13.0*  9.9  HGB 8.4* 7.9*  HCT 26.5* 24.8*  MCV 92.3 92.5  PLT 219 179   Basic Metabolic Panel: Recent Labs  Lab 05/25/23 0750 05/26/23 1425 05/27/23 0404 05/28/23 0335 05/29/23 0812 05/30/23 0315 05/31/23 0256  NA 130* 130* 128* 132* 134* 134* 133*  K 4.4 4.6 4.3 4.4 4.2 4.2 4.2   CL 89* 90* 87* 91* 94* 95* 94*  CO2 33* 31 29 30 30 30 30   GLUCOSE 175* 225* 245* 232* 204* 112* 177*  BUN 32* 62* 71* 80* 75* 73* 72*  CREATININE 1.72* 3.04* 3.09* 3.05* 2.20* 2.11* 2.01*  CALCIUM  8.7* 8.6* 8.3* 8.8* 9.0 8.8* 8.4*  MG 2.2 2.3 2.2 2.4  --  2.5*  --    GFR: Estimated Creatinine Clearance: 20.5 mL/min (A) (by C-G formula based on SCr of 2.01 mg/dL (H)). Liver Function Tests: Recent Labs  Lab 05/30/23 0315  AST 97*  ALT 86*  ALKPHOS 66  BILITOT 0.9  PROT 6.1*  ALBUMIN 2.1*   No results for input(s): "LIPASE", "AMYLASE" in the last 168 hours. No results for input(s): "AMMONIA" in the last 168 hours. Coagulation Profile: No results for input(s): "INR", "PROTIME" in the last 168 hours. Cardiac Enzymes: No results for input(s): "CKTOTAL", "CKMB", "CKMBINDEX", "TROPONINI" in the last 168 hours. BNP (last 3 results) Recent Labs    03/14/23 0924  PROBNP 235.0*   HbA1C: No results for input(s): "HGBA1C" in the last 72 hours. CBG: Recent Labs  Lab 05/30/23 1015 05/30/23 1120 05/30/23 1601 05/30/23 2101 05/31/23 0551  GLUCAP 243* 277* 237* 190* 196*   Lipid Profile: No results for input(s): "CHOL", "HDL", "LDLCALC", "TRIG", "CHOLHDL", "LDLDIRECT" in the last 72 hours. Thyroid  Function Tests: No results for input(s): "TSH", "T4TOTAL", "FREET4", "T3FREE", "THYROIDAB" in the last 72 hours. Anemia Panel: No results for input(s): "VITAMINB12", "FOLATE", "FERRITIN", "TIBC", "IRON ", "RETICCTPCT" in the last 72 hours. Urine analysis:    Component Value Date/Time   COLORURINE YELLOW 05/14/2023 1148   APPEARANCEUR TURBID (A) 05/14/2023 1148   LABSPEC 1.014 05/14/2023 1148   PHURINE 5.0 05/14/2023 1148   GLUCOSEU NEGATIVE 05/14/2023 1148   GLUCOSEU NEGATIVE 03/15/2022 1217   HGBUR NEGATIVE 05/14/2023 1148   BILIRUBINUR NEGATIVE 05/14/2023 1148   KETONESUR NEGATIVE 05/14/2023 1148   PROTEINUR 30 (A) 05/14/2023 1148   UROBILINOGEN 0.2 03/15/2022 1217    NITRITE NEGATIVE 05/14/2023 1148   LEUKOCYTESUR NEGATIVE 05/14/2023 1148   Sepsis Labs: @LABRCNTIP (procalcitonin:4,lacticidven:4)  )No results found for this or any previous visit (from the past 240 hours).   Radiology Studies: No results found.   Scheduled Meds:  amLODipine   2.5 mg Oral Daily   atorvastatin   40 mg Oral Daily   enoxaparin  (LOVENOX ) injection  30 mg Subcutaneous Q24H   feeding supplement  237 mL Oral BID BM   fesoterodine   4 mg Oral Daily   fluticasone   2 spray Each Nare BID   furosemide   40 mg Intravenous Daily   furosemide   40 mg Intravenous Once   hydrALAZINE   75 mg Oral Q8H   insulin  aspart  0-15 Units Subcutaneous TID WC   insulin  aspart  5 Units Subcutaneous TID WC   [START ON 06/01/2023] insulin  glargine-yfgn  30 Units Subcutaneous Daily   ipratropium-albuterol   3 mL Nebulization BID   isosorbide  mononitrate  60 mg Oral Daily   lactulose   20 g Oral BID   levothyroxine   25 mcg Oral Daily   metoprolol  succinate  12.5 mg Oral Daily   mirtazapine   15  mg Oral QHS   pantoprazole   40 mg Oral BID   predniSONE   40 mg Oral Q breakfast   sodium chloride  flush  3 mL Intravenous Q12H   Continuous Infusions:   LOS: 17 days    Time spent:    Deforest Fast, MD [facility]   05/31/2023, 11:13 AM

## 2023-05-31 NOTE — TOC Progression Note (Addendum)
 Transition of Care [facility]) - Progression Note    Patient Details  Name: Mary Mccarthy MRN: [id number] Date of Birth: 01-07-1941  [facility]) CM/SW Contact  Arron Big, [place] Phone Number: 05/31/2023, 11:15 AM  Clinical Narrative:  CSW spoke with patient about decision on SNF. Patient stated she wants to go home with home health and that her son, nephew, and nieces will provide support. CSW called patients son to confirm that he will assist patient at home. Patient states she has had [facility] in the past but wants to go with a different home health agency. CSW notified RNCM.   Patients son stated that he will provide transportation when patient is ready to DC.   TOC will continue to follow.    Expected Discharge Plan: Home w Home Health Services Barriers to Discharge: Continued Medical Work up  Expected Discharge Plan and Services     Post Acute Care Choice: Skilled Nursing Facility Living arrangements for the past 2 months: Single Family Home                                       Social Determinants of Health (SDOH) Interventions SDOH Screenings   Food Insecurity: No Food Insecurity (05/14/2023)  Recent Concern: Food Insecurity - Food Insecurity Present (03/01/2023)  Housing: Low Risk  (05/17/2023)  Recent Concern: Housing - High Risk (03/05/2023)  Transportation Needs: No Transportation Needs (05/17/2023)  Utilities: At Risk (05/14/2023)  Alcohol Screen: Low Risk  (05/17/2023)  Depression (PHQ2-9): Low Risk  (03/14/2023)  Financial Resource Strain: Low Risk  (05/17/2023)  Physical Activity: Inactive (05/11/2022)  Social Connections: Socially Isolated (05/14/2023)  Stress: No Stress Concern Present (05/11/2022)  Tobacco Use: Low Risk  (05/14/2023)    Readmission Risk Interventions    11/04/2020   11:24 AM  Readmission Risk Prevention Plan  Transportation Screening Complete  PCP or Specialist Appt within 3-5 Days Complete  HRI or Home Care Consult  Complete  Social Work Consult for Recovery Care Planning/Counseling Not Complete  Palliative Care Screening Not Applicable  Medication Review Ocean[employer]) Complete

## 2023-05-31 NOTE — Progress Notes (Signed)
 Physical Therapy Treatment Patient Details Name: Mary Mccarthy MRN: [id number] DOB: [birth year] Today's Date: 05/31/2023   History of Present Illness Patient is a 83 year old female with acute on chronic respiratory failure with hypoxia secondary to acute on chronic diastolic CHF. Pt found to have aspiration PNA. History of HTN, hyperlipidemia, respiratory failure on 2 L, PAF, GI bleed, aortic stenosis s/p TAVR,  transient heart block, diabetes mellitus type II, CAD, hypothyroidism, GERD    PT Comments  Pt greeted supine in bed, pleasant and agreeable to PT session. She completed transfers with supervision using rollator. Pt engaged in two short bouts of in-room ambulation with CGA using rollator. Pt's primary limiting factor is reduced cardiopulmonary endurance and increased need for supplemental oxygen . Pt continues to desaturate with mobility attempts into the low 80s SpO2 on 8L, she required ~54min seated rest, and bump up to 10L to recover SpO2 >88%. RN and MD notified of pt's desaturation. Reviewed education on incentive spirometer and instructed pt to peform 10 reps every hour. Will continue to follow acutely and advance appropriately.       If plan is discharge home, recommend the following: A lot of help with walking and/or transfers;A lot of help with bathing/dressing/bathroom;Assistance with cooking/housework;Assist for transportation;Help with stairs or ramp for entrance   Can travel by private vehicle     No  Equipment Recommendations  Wheelchair (measurements PT);Wheelchair cushion (measurements PT)    Recommendations for Other Services       Precautions / Restrictions Precautions Precautions: Fall Recall of Precautions/Restrictions: Intact Precaution/Restrictions Comments: watch SpO2 Restrictions Weight Bearing Restrictions Per Provider Order: No     Mobility  Bed Mobility Overal bed mobility: Needs Assistance Bed Mobility: Supine to Sit     Supine to sit:  Min assist, HOB elevated, Used rails     General bed mobility comments: Pt took increased time to sit up on L side of bed. She used bed rail with LUE and pulled on PT with RUE. Pt required assistance bringing hips fwd and feet flat to ground, used bed pad to shift her to the edge.    Transfers Overall transfer level: Needs assistance Equipment used: Rollator (4 wheels) Transfers: Sit to/from Stand, Bed to chair/wheelchair/BSC Sit to Stand: Supervision   Step pivot transfers: Supervision       General transfer comment: Pt demonstrated proper hand positioning using rollator. She was able to power up from lowest bed height, commode, and recliner chair without physical assistance. Good eccentric control with sitting. Pt took increased time to pivot and turn, VC to maintain body close to AD.    Ambulation/Gait Ambulation/Gait assistance: Contact guard assist Gait Distance (Feet): 15 Feet (x2, ~56min seated rest between bouts) Assistive device: Rollator (4 wheels) Gait Pattern/deviations: Step-through pattern, Decreased step length - right, Decreased step length - left, Trunk flexed Gait velocity: decreased Gait velocity interpretation: <1.31 ft/sec, indicative of household ambulator   General Gait Details: Pt ambulated with a very slow pace and short steps. She maintained a flex posture with rollator to far in front of her. VC to keep body close to the AD and on which direction to turn d/t lines/leads. Pt had no LOB. Cued PLB technique throughout.   Stairs             Wheelchair Mobility     Tilt Bed    Modified Rankin (Stroke Patients Only)       Balance Overall balance assessment: Needs assistance Sitting-balance support: Feet supported Sitting  balance-[name]: Fair Sitting balance - Comments: Pt sat EOB with supervision. She required assistance with scooting   Standing balance support: Bilateral upper extremity supported, During functional activity, Reliant on  assistive device for balance Standing balance-[name]: Poor Standing balance comment: Pt dependent on rollator for stability. She was unable to address pericare in standing and decrease to unilateral UE support.                            Communication Communication Communication: Impaired Factors Affecting Communication: Hearing impaired  Cognition Arousal: Alert Behavior During Therapy: WFL for tasks assessed/performed   PT - Cognitive impairments: No apparent impairments                         Following commands: Intact      Cueing Cueing Techniques: Verbal cues  Exercises      General Comments General comments (skin integrity, edema, etc.): Pt was greeted blowing her nose with HFNC off and SpO2 78%. She donned HFNC at 6L and was able to recover to 89% SpO2 within 1 min. Pt desaturated to 85% SpO2 when coming to EOB, bumped up to 8L and pt recovered to >88% SpO2. Maintained 8L with functional mobility and desaturated to 83% SpO2, required seated rest for ~46mins and bump up to 10L to recover to >88% SpO2. RN and MD notified of pt's desaturation. Left pt on 8L while seated in recliner chair with SpO2 88%. Reviewed education on incentive spirometer and instructed pt to peform 10 reps every hour.      Pertinent Vitals/Pain Pain Assessment Pain Assessment: No/denies pain    Home Living                          Prior Function            PT Goals (current goals can now be found in the care plan section) Acute Rehab PT Goals Patient Stated Goal: Get stronger and tolerate more activity Progress towards PT goals: Progressing toward goals    Frequency    Min 2X/week      PT Plan      Co-evaluation              AM-PAC PT "6 Clicks" Mobility   Outcome Measure  Help needed turning from your back to your side while in a flat bed without using bedrails?: A Little Help needed moving from lying on your back to sitting on the side of  a flat bed without using bedrails?: A Little Help needed moving to and from a bed to a chair (including a wheelchair)?: A Little Help needed standing up from a chair using your arms (e.g., wheelchair or bedside chair)?: A Little Help needed to walk in hospital room?: A Lot Help needed climbing 3-5 steps with a railing? : Total 6 Click Score: 15    End of Session Equipment Utilized During Treatment: Gait belt;Oxygen  Activity Tolerance: Patient tolerated treatment well;Patient limited by fatigue Patient left: in chair;with call bell/phone within reach;with chair alarm set Nurse Communication: Mobility status;Other (comment) (Desaturation with activity and BM) PT Visit Diagnosis: Other abnormalities of gait and mobility (R26.89);Muscle weakness (generalized) (M62.81);Difficulty in walking, not elsewhere classified (R26.2)     Time: 1610-9604 PT Time Calculation (min) (ACUTE ONLY): 30 min  Charges:    $Gait Training: 8-22 mins $Therapeutic Activity: 8-22 mins PT General Charges $$  ACUTE PT VISIT: 1 Visit                     Glenford Lanes, PT, DPT Acute Rehabilitation Services Office: [phone] Secure Chat Preferred  Riva Chester 05/31/2023, 9:32 AM

## 2023-05-31 NOTE — Progress Notes (Signed)
   05/31/23 1926  BiPAP/CPAP/SIPAP  Reason BIPAP/CPAP not in use Non-compliant (doesnt wear it at home)  BiPAP/CPAP /SiPAP Vitals  Bilateral Breath Sounds Clear;Diminished   Pt refused she doesn't wear it at home,,,,,, CPAP bedside

## 2023-06-01 DIAGNOSIS — I5033 Acute on chronic diastolic (congestive) heart failure: Secondary | ICD-10-CM | POA: Diagnosis not present

## 2023-06-01 LAB — CBC
HCT: 25.2 % — ABNORMAL LOW (ref 36.0–46.0)
Hemoglobin: 7.9 g/dL — ABNORMAL LOW (ref 12.0–15.0)
MCH: 29.3 pg (ref 26.0–34.0)
MCHC: 31.3 g/dL (ref 30.0–36.0)
MCV: 93.3 fL (ref 80.0–100.0)
Platelets: 201 10*3/uL (ref 150–400)
RBC: 2.7 MIL/uL — ABNORMAL LOW (ref 3.87–5.11)
RDW: 17 % — ABNORMAL HIGH (ref 11.5–15.5)
WBC: 9 10*3/uL (ref 4.0–10.5)
nRBC: 0 % (ref 0.0–0.2)

## 2023-06-01 LAB — GLUCOSE, CAPILLARY
Glucose-Capillary: 228 mg/dL — ABNORMAL HIGH (ref 70–99)
Glucose-Capillary: 121 mg/dL — ABNORMAL HIGH (ref 70–99)
Glucose-Capillary: 142 mg/dL — ABNORMAL HIGH (ref 70–99)
Glucose-Capillary: 156 mg/dL — ABNORMAL HIGH (ref 70–99)

## 2023-06-01 LAB — BASIC METABOLIC PANEL WITH GFR
Anion gap: 6 (ref 5–15)
BUN: 68 mg/dL — ABNORMAL HIGH (ref 8–23)
CO2: 31 mmol/L (ref 22–32)
Calcium: 8.4 mg/dL — ABNORMAL LOW (ref 8.9–10.3)
Chloride: 97 mmol/L — ABNORMAL LOW (ref 98–111)
Creatinine, Ser: 1.91 mg/dL — ABNORMAL HIGH (ref 0.44–1.00)
GFR, Estimated: 26 mL/min — ABNORMAL LOW (ref >60)
Glucose, Bld: 210 mg/dL — ABNORMAL HIGH (ref 70–99)
Potassium: 4.6 mmol/L (ref 3.5–5.1)
Sodium: 134 mmol/L — ABNORMAL LOW (ref 135–145)

## 2023-06-01 MED ORDER — INSULIN GLARGINE-YFGN 100 UNIT/ML ~~LOC~~ SOLN
35.0000 [IU] | Freq: Every day | SUBCUTANEOUS | Status: DC
  Administered 2023-06-02 – 2023-06-07 (×5): 35 [IU] via SUBCUTANEOUS
  Filled 2023-06-01 (×6): qty 0.35

## 2023-06-01 MED ORDER — INSULIN ASPART 100 UNIT/ML IJ SOLN
6.0000 [IU] | Freq: Three times a day (TID) | INTRAMUSCULAR | Status: DC
  Administered 2023-06-01 – 2023-06-07 (×19): 6 [IU] via SUBCUTANEOUS

## 2023-06-01 MED ORDER — HYDRALAZINE HCL 25 MG PO TABS
25.0000 mg | ORAL_TABLET | Freq: Three times a day (TID) | ORAL | Status: DC
  Administered 2023-06-01 – 2023-06-05 (×12): 25 mg via ORAL
  Filled 2023-06-01 (×11): qty 1

## 2023-06-01 NOTE — TOC Progression Note (Addendum)
 Transition of Care [facility]) - Progression Note    Patient Details  Name: Mary Mccarthy MRN: [id number] Date of Birth: 03-29-1940  Transition of [facility]) CM/SW Contact  Jennett Model, RN Phone Number: 06/01/2023, 3:22 PM  Clinical Narrative:    NCM offered choice for HHPT, HHOT,  she has no preference,  NCM made referral to [facility] with [facility].  Awaiting to hear back to see if she can take referral.  She states her son will bring her oxygen  tank for her to go home with tomorrow.  She has great family support. Per Bridgette Campus with [facility] said they can take referral and soc will be on May 1, the patient states this is ok with her.     Expected Discharge Plan: Home w Home Health Services Barriers to Discharge: Continued Medical Work up  Expected Discharge Plan and Services     Post Acute Care Choice: Skilled Nursing Facility Living arrangements for the past 2 months: Single Family Home                                       Social Determinants of Health (SDOH) Interventions SDOH Screenings   Food Insecurity: No Food Insecurity (05/14/2023)  Recent Concern: Food Insecurity - Food Insecurity Present (03/01/2023)  Housing: Low Risk  (05/17/2023)  Recent Concern: Housing - High Risk (03/05/2023)  Transportation Needs: No Transportation Needs (05/17/2023)  Utilities: At Risk (05/14/2023)  Alcohol Screen: Low Risk  (05/17/2023)  Depression (PHQ2-9): Low Risk  (03/14/2023)  Financial Resource Strain: Low Risk  (05/17/2023)  Physical Activity: Inactive (05/11/2022)  Social Connections: Socially Isolated (05/14/2023)  Stress: No Stress Concern Present (05/11/2022)  Tobacco Use: Low Risk  (05/14/2023)    Readmission Risk Interventions    11/04/2020   11:24 AM  Readmission Risk Prevention Plan  Transportation Screening Complete  PCP or Specialist Appt within 3-5 Days Complete  HRI or Home Care Consult Complete  Social Work Consult for Recovery Care  Planning/Counseling Not Complete  Palliative Care Screening Not Applicable  Medication Review Oceanographer) Complete

## 2023-06-01 NOTE — Progress Notes (Signed)
   06/01/23 1951  BiPAP/CPAP/SIPAP  BiPAP/CPAP/SIPAP Pt Type Adult  BiPAP/CPAP/SIPAP Resmed  Reason BIPAP/CPAP not in use Other(comment) (Patient states she does not wear one at home)  BiPAP/CPAP /SiPAP Vitals  Pulse Rate (!) 58  Resp 17  SpO2 95 %  Bilateral Breath Sounds Clear

## 2023-06-01 NOTE — Plan of Care (Signed)
   Problem: Clinical Measurements: Goal: Ability to maintain clinical measurements within normal limits will improve Outcome: Progressing Goal: Respiratory complications will improve Outcome: Progressing Goal: Cardiovascular complication will be avoided Outcome: Progressing

## 2023-06-01 NOTE — Progress Notes (Addendum)
 Rounding Note    Patient Name: Mary Mccarthy Date of Encounter: 06/01/2023  [facility] Cardiologist: Randene Bustard, MD   Subjective   Very little recorded urine output overnight. Creatinine incrementally better today at 1.91 (from 2.01). Feels weak - noted to have diastolic hypotension in the 40's. Also has reported diarrhea and mouth sores related to her dentures.  Inpatient Medications    Scheduled Meds:  amLODipine   2.5 mg Oral Daily   atorvastatin   40 mg Oral Daily   enoxaparin  (LOVENOX ) injection  30 mg Subcutaneous Q24H   fesoterodine   4 mg Oral Daily   fluticasone   2 spray Each Nare BID   furosemide   40 mg Intravenous Daily   hydrALAZINE   75 mg Oral Q8H   insulin  aspart  0-15 Units Subcutaneous TID WC   insulin  aspart  6 Units Subcutaneous TID WC   [START ON 06/02/2023] insulin  glargine-yfgn  35 Units Subcutaneous Daily   ipratropium-albuterol   3 mL Nebulization BID   isosorbide  mononitrate  60 mg Oral Daily   lactulose   20 g Oral BID   levothyroxine   25 mcg Oral Daily   metoprolol  succinate  12.5 mg Oral Daily   mirtazapine   15 mg Oral QHS   pantoprazole   40 mg Oral BID   predniSONE   40 mg Oral Q breakfast   sodium chloride  flush  3 mL Intravenous Q12H   Continuous Infusions:  PRN Meds: acetaminophen , albuterol , ALPRAZolam , guaiFENesin -dextromethorphan , sodium chloride  flush, trimethobenzamide    Vital Signs    Vitals:   06/01/23 0600 06/01/23 0622 06/01/23 0721 06/01/23 0757  BP: (!) 125/40 (!) 143/45 (!) 169/51   Pulse:   (!) 54   Resp:  18 18 (!) 26  Temp:   97.6 F (36.4 C)   TempSrc:   Oral   SpO2:   100%   Weight:      Height:        Intake/Output Summary (Last 24 hours) at 06/01/2023 0913 Last data filed at 05/31/2023 2200 Gross per 24 hour  Intake 240 ml  Output 300 ml  Net -60 ml      05/31/2023    4:07 AM 05/30/2023    3:28 AM 05/29/2023    4:50 AM  Last 3 Weights  Weight (lbs) 185 lb 10 oz 182 lb 8.7 oz 181 lb   Weight (kg) 84.2 kg 82.8 kg 82.1 kg      Telemetry    Sinus rhythm HR 60s - Personally Reviewed  ECG    No new tracings - Personally Reviewed  Physical Exam   GEN: No acute distress.   Neck: No JVD Cardiac: RRR, 3/6 systolic murmur at left sternal border Respiratory: crackles L > R GI: Soft, nontender, non-distended  MS: No edema; No deformity. Neuro:  Nonfocal  Psych: Normal affect   Labs    High Sensitivity Troponin:   Recent Labs  Lab 05/14/23 0922 05/14/23 1340 05/27/23 0404  TROPONINIHS 35* 31* 16     Chemistry Recent Labs  Lab 05/27/23 0404 05/28/23 0335 05/29/23 0812 05/30/23 0315 05/31/23 0256 06/01/23 0303  NA 128* 132*   < > 134* 133* 134*  K 4.3 4.4   < > 4.2 4.2 4.6  CL 87* 91*   < > 95* 94* 97*  CO2 29 30   < > 30 30 31   GLUCOSE 245* 232*   < > 112* 177* 210*  BUN 71* 80*   < > 73* 72* 68*  CREATININE 3.09* 3.05*   < >  2.11* 2.01* 1.91*  CALCIUM  8.3* 8.8*   < > 8.8* 8.4* 8.4*  MG 2.2 2.4  --  2.5*  --   --   PROT  --   --   --  6.1*  --   --   ALBUMIN  --   --   --  2.1*  --   --   AST  --   --   --  97*  --   --   ALT  --   --   --  86*  --   --   ALKPHOS  --   --   --  66  --   --   BILITOT  --   --   --  0.9  --   --   GFRNONAA 14* 15*   < > 23* 24* 26*  ANIONGAP 12 11   < > 9 9 6    < > = values in this interval not displayed.    Lipids No results for input(s): "CHOL", "TRIG", "HDL", "LABVLDL", "LDLCALC", "CHOLHDL" in the last 168 hours.  Hematology Recent Labs  Lab 05/30/23 0315 05/31/23 0256 06/01/23 0303  WBC 13.0* 9.9 9.0  RBC 2.87* 2.68* 2.70*  HGB 8.4* 7.9* 7.9*  HCT 26.5* 24.8* 25.2*  MCV 92.3 92.5 93.3  MCH 29.3 29.5 29.3  MCHC 31.7 31.9 31.3  RDW 17.0* 17.2* 17.0*  PLT 219 179 201   Thyroid  No results for input(s): "TSH", "FREET4" in the last 168 hours.  BNP Recent Labs  Lab 05/29/23 0449  BNP 591.7*    DDimer No results for input(s): "DDIMER" in the last 168 hours.   Radiology    No results  found.  Cardiac Studies   TTE 05/15/2023  1. Left ventricular ejection fraction, by estimation, is 60 to 65%. The  left ventricle has normal function. The left ventricle has no regional  wall motion abnormalities. There is severe asymmetric left ventricular  hypertrophy of the septal segment. Left   ventricular diastolic function could not be evaluated.   2. Right ventricular systolic function is normal. The right ventricular  size is mildly enlarged. There is moderately elevated pulmonary artery  systolic pressure. The estimated right ventricular systolic pressure is  53.7 mmHg.   3. Left atrial size was mildly dilated.   4. Right atrial size was mildly dilated.   5. The mitral valve is degenerative. Trivial mitral valve regurgitation.  Mild mitral stenosis. The mean mitral valve gradient is 3.0 mmHg. Severe  mitral annular calcification.   6. Tricuspid valve regurgitation is mild to moderate.   7. Mild paravalvular leak adjacent to the intervalvular fibrosa. The  aortic valve has been repaired/replaced. Aortic valve regurgitation is  mild. No aortic stenosis is present. There is a 26 mm [employer] stented  (TAVR) valve present in the aortic  position. Procedure Date: 10/26/2020. Echo findings are consistent with  normal structure and function of the aortic valve prosthesis. Echo  findings are consistent with perivalvular leak of the aortic prosthesis.  Aortic regurgitation PHT measures 514 msec.   Aortic valve mean gradient measures 9.0 mmHg. Aortic valve Vmax measures  1.88 m/s.   8. The inferior vena cava is dilated in size with <50% respiratory  variability, suggesting right atrial pressure of 15 mmHg.   Patient Profile     83 y.o. female 83 y.o. female with persistent atrial fibrillation, HFpEF, aortic stenosis status post TAVR, diabetes, obesity who was admitted on 05/14/2023 for acute on  chronic diastolic heart failure.   Assessment & Plan    Acute on chronic diastolic heart  failure CKD - sCr 1.91 (baseline 1.5) - Continue IV lasix  40 mg daily for at least 2 days - monitor creatinine.  Amiodarone  lung toxicity - discontinued amiodarone  and initiated corticosteroids - plan for slow taper per PCCM for 204 weeks with close follow up with pulmonology  Persistent atrial fibrillation - discontinue amiodarone  as above - telemetry with sinus rhythm  Acute hypoxic respiratory failure - multifactorial with amiodarone  lung toxicity, PNA/aspiration PNA, and HFpEF  Hypertension - 2.5 mg amlodipine , 60 mg imdur , and 12.5 mg toprol  - Diastolic BP low, will decrease hydralazine  to 25 mg Q8hrs, since it primarily effects venous capacitance (diastolic bp)  Diarrhea - will defer to hospitalist, but I advised collecting her stool today for analysis  For questions or updates, please contact [facility] Please consult [email or web address] for contact info under   Hazle Lites, MD, [facility], FNLA, FACP  [place]  [facility]  Medical Director of the [facility]  [facility] Diplomate of the American Board of Clinical Lipidology Attending Cardiologist  Direct Dial: [phone]  Fax: [phone]  Website:  [email or web address]  Hazle Lites, MD  06/01/2023, 9:13 AM

## 2023-06-01 NOTE — Progress Notes (Addendum)
 PROGRESS NOTE    Mary Mccarthy  BJY:[id number] DOB: 1941-01-13 DOA: 05/14/2023 PCP: Roslyn Coombe, MD  83 yo female with the past medical history of hypertension, hyperlipidemia, paroxysmal atrial fibrillation, aortic stenosis sp TAVR, T2DM and obesity who presented with dyspnea and lower extremity edema.  Found to have acute on chronic HFpEF with worsening hypoxia secondary to likely amiodarone  toxicity.  - 4/21 pulmonary consulted, amiodarone  discontinued and prednisone  taper over 2 to 4 weeks recommended  Subjective: - On 5 L O2 this morning at rest, reports some dyspnea and cough, some diarrhea overnight  Assessment and Plan:  Acute hypoxic respiratory failure -Multifactorial in the setting of chronic diastolic CHF, amiodarone  toxicity also suspected -On 2 L O2 at baseline, with significant hypoxia noted with minimal exertion, up to 10 L ambulating in the room yesterday  -Encouraged incentive spirometry, flutter valve, repeat diuretics, DuoNebs, remains on prednisone  -Diuretics as tolerated, repeating IV Lasix  today   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  Evaluated by pulmonology, concern for amiodarone  toxicity.  Discontinued amiodarone , initiated corticosteroids.  Transitioned from IV Solu-Medrol  to prednisone  40 mg daily.  Slow taper over 2-4 weeks per pulmonology.  Follow-up with pulmonology in the outpatient setting to reassess oxygen  needs and repeat chest x-ray.   Acute on chronic HFpEF - Diuresed with IV Lasix , 8.4 L negative - Cards following appears overloaded, weight trending up, continue IV Lasix  again today - Creatinine slowly improving, GDMT limited by AKI/CKD   Acute kidney injury on CKD 3B - Baseline creatinine around 1.5, creatinine peaked at 3, now improving, monitor  Diarrhea Could be related to her Ensure supplement, will hold this and monitor    Persistent atrial fibrillation -Amiodarone  discontinued.  Currently normal sinus rhythm.  -She has declined anticoagulation in the past  Diabetes mellitus - Increase glargine, meal coverage   CAD/hypertension - Continue metoprolol , Imdur , hydralazine , amlodipine .  Statin on board.  Not on aspirin .   Aortic stenosis s/p TAVR - Stable.   Possible aspiration pneumonia - Appears to be ruled out secondary to diagnosis above (amiodarone  toxicity).   Physical debilitation muscle weakness - Exacerbated by hypoxia and deconditioning.  Evaluated by PT/OT.  Currently working with [facility] on SNF placement.,  Patient declines SNF    DVT prophylaxis: Lovenox  Code Status: DNR Family Communication: None present Disposition Plan: Declines SNF, home likely 48 hours  Consultants:    Procedures:   Antimicrobials:    Objective: Vitals:   06/01/23 0600 06/01/23 0622 06/01/23 0721 06/01/23 0757  BP: (!) 125/40 (!) 143/45 (!) 169/51   Pulse:   (!) 54   Resp:  18 18 (!) 26  Temp:   97.6 F (36.4 C)   TempSrc:   Oral   SpO2:   100%   Weight:      Height:        Intake/Output Summary (Last 24 hours) at 06/01/2023 1019 Last data filed at 06/01/2023 0916 Gross per 24 hour  Intake 480 ml  Output 300 ml  Net 180 ml   Filed Weights   05/29/23 0450 05/30/23 0328 05/31/23 0407  Weight: (P) 82.1 kg 82.8 kg 84.2 kg    Examination:  General exam: Appears calm and comfortable  Respiratory system: Few basilar Rales Cardiovascular system: S1 & S2 heard, RRR.  Abd: nondistended, soft and nontender.Normal bowel sounds heard. Central nervous system: Alert and oriented. No focal neurological deficits. Extremities: Trace edema Skin: No rashes  Psychiatry:  Mood & affect appropriate.     Data Reviewed:   CBC: Recent Labs  Lab 05/30/23 0315 05/31/23 0256 06/01/23 0303  WBC 13.0* 9.9 9.0  HGB 8.4* 7.9* 7.9*  HCT 26.5* 24.8* 25.2*  MCV 92.3 92.5 93.3  PLT 219 179 201   Basic  Metabolic Panel: Recent Labs  Lab 05/26/23 1425 05/27/23 0404 05/28/23 0335 05/29/23 0812 05/30/23 0315 05/31/23 0256 06/01/23 0303  NA 130* 128* 132* 134* 134* 133* 134*  K 4.6 4.3 4.4 4.2 4.2 4.2 4.6  CL 90* 87* 91* 94* 95* 94* 97*  CO2 31 29 30 30 30 30 31   GLUCOSE 225* 245* 232* 204* 112* 177* 210*  BUN 62* 71* 80* 75* 73* 72* 68*  CREATININE 3.04* 3.09* 3.05* 2.20* 2.11* 2.01* 1.91*  CALCIUM  8.6* 8.3* 8.8* 9.0 8.8* 8.4* 8.4*  MG 2.3 2.2 2.4  --  2.5*  --   --    GFR: Estimated Creatinine Clearance: 21.6 mL/min (A) (by C-G formula based on SCr of 1.91 mg/dL (H)). Liver Function Tests: Recent Labs  Lab 05/30/23 0315  AST 97*  ALT 86*  ALKPHOS 66  BILITOT 0.9  PROT 6.1*  ALBUMIN 2.1*   No results for input(s): "LIPASE", "AMYLASE" in the last 168 hours. No results for input(s): "AMMONIA" in the last 168 hours. Coagulation Profile: No results for input(s): "INR", "PROTIME" in the last 168 hours. Cardiac Enzymes: No results for input(s): "CKTOTAL", "CKMB", "CKMBINDEX", "TROPONINI" in the last 168 hours. BNP (last 3 results) Recent Labs    03/14/23 0924  PROBNP 235.0*   HbA1C: No results for input(s): "HGBA1C" in the last 72 hours. CBG: Recent Labs  Lab 05/31/23 0551 05/31/23 1114 05/31/23 1620 05/31/23 2103 06/01/23 0610  GLUCAP 196* 121* 197* 273* 228*   Lipid Profile: No results for input(s): "CHOL", "HDL", "LDLCALC", "TRIG", "CHOLHDL", "LDLDIRECT" in the last 72 hours. Thyroid  Function Tests: No results for input(s): "TSH", "T4TOTAL", "FREET4", "T3FREE", "THYROIDAB" in the last 72 hours. Anemia Panel: No results for input(s): "VITAMINB12", "FOLATE", "FERRITIN", "TIBC", "IRON ", "RETICCTPCT" in the last 72 hours. Urine analysis:    Component Value Date/Time   COLORURINE YELLOW 05/14/2023 1148   APPEARANCEUR TURBID (A) 05/14/2023 1148   LABSPEC 1.014 05/14/2023 1148   PHURINE 5.0 05/14/2023 1148   GLUCOSEU NEGATIVE 05/14/2023 1148   GLUCOSEU  NEGATIVE 03/15/2022 1217   HGBUR NEGATIVE 05/14/2023 1148   BILIRUBINUR NEGATIVE 05/14/2023 1148   KETONESUR NEGATIVE 05/14/2023 1148   PROTEINUR 30 (A) 05/14/2023 1148   UROBILINOGEN 0.2 03/15/2022 1217   NITRITE NEGATIVE 05/14/2023 1148   LEUKOCYTESUR NEGATIVE 05/14/2023 1148   Sepsis Labs: @LABRCNTIP (procalcitonin:4,lacticidven:4)  )No results found for this or any previous visit (from the past 240 hours).   Radiology Studies: No results found.   Scheduled Meds:  amLODipine   2.5 mg Oral Daily   atorvastatin   40 mg Oral Daily   enoxaparin  (LOVENOX ) injection  30 mg Subcutaneous Q24H   fesoterodine   4 mg Oral Daily   fluticasone   2 spray Each Nare BID   furosemide   40 mg Intravenous Daily   hydrALAZINE   25 mg Oral Q8H   insulin  aspart  0-15 Units Subcutaneous TID WC   insulin  aspart  6 Units Subcutaneous TID WC   [START ON 06/02/2023] insulin  glargine-yfgn  35 Units Subcutaneous Daily   ipratropium-albuterol   3 mL Nebulization BID   isosorbide  mononitrate  60 mg Oral Daily   lactulose   20 g Oral BID   levothyroxine   25  mcg Oral Daily   metoprolol  succinate  12.5 mg Oral Daily   mirtazapine   15 mg Oral QHS   pantoprazole   40 mg Oral BID   predniSONE   40 mg Oral Q breakfast   sodium chloride  flush  3 mL Intravenous Q12H   Continuous Infusions:   LOS: 18 days    Time spent:    Deforest Fast, MD [facility]   06/01/2023, 10:19 AM

## 2023-06-02 DIAGNOSIS — I5033 Acute on chronic diastolic (congestive) heart failure: Secondary | ICD-10-CM | POA: Diagnosis not present

## 2023-06-02 LAB — CBC
HCT: 25.1 % — ABNORMAL LOW (ref 36.0–46.0)
Hemoglobin: 7.8 g/dL — ABNORMAL LOW (ref 12.0–15.0)
MCH: 29 pg (ref 26.0–34.0)
MCHC: 31.1 g/dL (ref 30.0–36.0)
MCV: 93.3 fL (ref 80.0–100.0)
Platelets: 225 10*3/uL (ref 150–400)
RBC: 2.69 MIL/uL — ABNORMAL LOW (ref 3.87–5.11)
RDW: 17.4 % — ABNORMAL HIGH (ref 11.5–15.5)
WBC: 8.7 10*3/uL (ref 4.0–10.5)
nRBC: 0 % (ref 0.0–0.2)

## 2023-06-02 LAB — BASIC METABOLIC PANEL WITH GFR
Anion gap: 6 (ref 5–15)
BUN: 58 mg/dL — ABNORMAL HIGH (ref 8–23)
CO2: 30 mmol/L (ref 22–32)
Calcium: 8.4 mg/dL — ABNORMAL LOW (ref 8.9–10.3)
Chloride: 99 mmol/L (ref 98–111)
Creatinine, Ser: 1.92 mg/dL — ABNORMAL HIGH (ref 0.44–1.00)
GFR, Estimated: 25 mL/min — ABNORMAL LOW (ref >60)
Glucose, Bld: 125 mg/dL — ABNORMAL HIGH (ref 70–99)
Potassium: 4.9 mmol/L (ref 3.5–5.1)
Sodium: 135 mmol/L (ref 135–145)

## 2023-06-02 LAB — GLUCOSE, CAPILLARY
Glucose-Capillary: 141 mg/dL — ABNORMAL HIGH (ref 70–99)
Glucose-Capillary: 157 mg/dL — ABNORMAL HIGH (ref 70–99)
Glucose-Capillary: 227 mg/dL — ABNORMAL HIGH (ref 70–99)
Glucose-Capillary: 228 mg/dL — ABNORMAL HIGH (ref 70–99)
Glucose-Capillary: 184 mg/dL — ABNORMAL HIGH (ref 70–99)

## 2023-06-02 MED ORDER — FUROSEMIDE 10 MG/ML IJ SOLN
40.0000 mg | Freq: Once | INTRAMUSCULAR | Status: AC
  Administered 2023-06-02: 40 mg via INTRAVENOUS
  Filled 2023-06-02: qty 4

## 2023-06-02 NOTE — Progress Notes (Signed)
 PROGRESS NOTE    Mary Mccarthy  ZOX:[id number] DOB: 1940-08-04 DOA: 05/14/2023 PCP: Roslyn Coombe, MD  83 yo female with the past medical history of hypertension, hyperlipidemia, paroxysmal atrial fibrillation, aortic stenosis sp TAVR, T2DM and obesity who presented with dyspnea and lower extremity edema.  Found to have acute on chronic HFpEF with worsening hypoxia secondary to likely amiodarone  toxicity.  - 4/21 pulmonary consulted, amiodarone  discontinued and prednisone  taper over 2 to 4 weeks recommended  Subjective: - Feels fair, on 2 L O2 this morning but sats in the low 80s, improving on 4 L, some cough, otherwise overall fair  Assessment and Plan:  Acute hypoxic respiratory failure -Multifactorial in the setting of chronic diastolic CHF, amiodarone  toxicity also suspected -On 2 L O2 at baseline, with significant hypoxia noted with minimal exertion, up to 10 L ambulating in the room  -Encouraged incentive spirometry, flutter valve, repeat diuretics, DuoNebs, remains on prednisone  - Continuing IV Lasix  today,   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  Evaluated by pulmonology, concern for amiodarone  toxicity.  Discontinued amiodarone , initiated corticosteroids.  Transitioned from IV Solu-Medrol  to prednisone  40 mg daily.  Slow taper over 2-4 weeks per pulmonology.  Follow-up with pulmonology in the outpatient setting to reassess oxygen  needs and repeat chest x-ray. -Cut down to 30 Mg of prednisone  next week   Acute on chronic HFpEF - Diuresed with IV Lasix , 8.4 L negative - Cards following appears overloaded, weight trending up, continue IV Lasix  again today - Creatinine slowly improving, GDMT limited by AKI/CKD   Acute kidney injury on CKD 3B - Baseline creatinine around 1.5, creatinine peaked at 3, now improving, monitor  Diarrhea -Discontinued Ensure, improving,  monitor   Persistent atrial fibrillation -Amiodarone  discontinued. -She has declined anticoagulation in the past  Diabetes mellitus - Increase glargine, meal coverage   CAD/hypertension - Continue metoprolol , Imdur , hydralazine , amlodipine .  Statin on board.  Not on aspirin .   Aortic stenosis s/p TAVR - Stable.   Possible aspiration pneumonia - Appears to be ruled out secondary to diagnosis above (amiodarone  toxicity).   Physical debilitation muscle weakness - Exacerbated by hypoxia and deconditioning.  Evaluated by PT/OT.  Currently working with [facility] on SNF placement.,  Patient declines SNF    DVT prophylaxis: Lovenox  Code Status: DNR Family Communication: None present Disposition Plan: Declines SNF, home likely 48 hours  Consultants:    Procedures:   Antimicrobials:    Objective: Vitals:   06/02/23 0623 06/02/23 0757 06/02/23 0758 06/02/23 0931  BP:  (!) 158/48    Pulse:  61  66  Resp:      Temp:  97.9 F (36.6 C)    TempSrc:  Oral    SpO2:   97%   Weight: 79 kg     Height:        Intake/Output Summary (Last 24 hours) at 06/02/2023 1057 Last data filed at 06/02/2023 0645 Gross per 24 hour  Intake 237 ml  Output 2025 ml  Net -1788 ml   Filed Weights   05/30/23 0328 05/31/23 0407 06/02/23 0623  Weight: 82.8 kg 84.2 kg 79 kg    Examination:  General exam: Appears calm and comfortable  Respiratory system: Few basilar Rales Cardiovascular system: S1 & S2 heard, RRR.  Abd: nondistended, soft and nontender.Normal bowel sounds heard. Central nervous system: Alert and oriented. No focal neurological deficits. Extremities: Trace edema Skin: No rashes Psychiatry:  Mood & affect  appropriate.     Data Reviewed:   CBC: Recent Labs  Lab 05/30/23 0315 05/31/23 0256 06/01/23 0303 06/02/23 0316  WBC 13.0* 9.9 9.0 8.7  HGB 8.4* 7.9* 7.9* 7.8*  HCT 26.5* 24.8* 25.2* 25.1*  MCV 92.3 92.5 93.3 93.3  PLT 219 179 201 225   Basic Metabolic  Panel: Recent Labs  Lab 05/26/23 1425 05/27/23 0404 05/28/23 0335 05/29/23 0812 05/30/23 0315 05/31/23 0256 06/01/23 0303 06/02/23 0316  NA 130* 128* 132* 134* 134* 133* 134* 135  K 4.6 4.3 4.4 4.2 4.2 4.2 4.6 4.9  CL 90* 87* 91* 94* 95* 94* 97* 99  CO2 31 29 30 30 30 30 31 30   GLUCOSE 225* 245* 232* 204* 112* 177* 210* 125*  BUN 62* 71* 80* 75* 73* 72* 68* 58*  CREATININE 3.04* 3.09* 3.05* 2.20* 2.11* 2.01* 1.91* 1.92*  CALCIUM  8.6* 8.3* 8.8* 9.0 8.8* 8.4* 8.4* 8.4*  MG 2.3 2.2 2.4  --  2.5*  --   --   --    GFR: Estimated Creatinine Clearance: 20.8 mL/min (A) (by C-G formula based on SCr of 1.92 mg/dL (H)). Liver Function Tests: Recent Labs  Lab 05/30/23 0315  AST 97*  ALT 86*  ALKPHOS 66  BILITOT 0.9  PROT 6.1*  ALBUMIN 2.1*   No results for input(s): "LIPASE", "AMYLASE" in the last 168 hours. No results for input(s): "AMMONIA" in the last 168 hours. Coagulation Profile: No results for input(s): "INR", "PROTIME" in the last 168 hours. Cardiac Enzymes: No results for input(s): "CKTOTAL", "CKMB", "CKMBINDEX", "TROPONINI" in the last 168 hours. BNP (last 3 results) Recent Labs    03/14/23 0924  PROBNP 235.0*   HbA1C: No results for input(s): "HGBA1C" in the last 72 hours. CBG: Recent Labs  Lab 06/01/23 1047 06/01/23 1620 06/01/23 2052 06/02/23 0608 06/02/23 0802  GLUCAP 121* 142* 156* 141* 157*   Lipid Profile: No results for input(s): "CHOL", "HDL", "LDLCALC", "TRIG", "CHOLHDL", "LDLDIRECT" in the last 72 hours. Thyroid  Function Tests: No results for input(s): "TSH", "T4TOTAL", "FREET4", "T3FREE", "THYROIDAB" in the last 72 hours. Anemia Panel: No results for input(s): "VITAMINB12", "FOLATE", "FERRITIN", "TIBC", "IRON ", "RETICCTPCT" in the last 72 hours. Urine analysis:    Component Value Date/Time   COLORURINE YELLOW 05/14/2023 1148   APPEARANCEUR TURBID (A) 05/14/2023 1148   LABSPEC 1.014 05/14/2023 1148   PHURINE 5.0 05/14/2023 1148    GLUCOSEU NEGATIVE 05/14/2023 1148   GLUCOSEU NEGATIVE 03/15/2022 1217   HGBUR NEGATIVE 05/14/2023 1148   BILIRUBINUR NEGATIVE 05/14/2023 1148   KETONESUR NEGATIVE 05/14/2023 1148   PROTEINUR 30 (A) 05/14/2023 1148   UROBILINOGEN 0.2 03/15/2022 1217   NITRITE NEGATIVE 05/14/2023 1148   LEUKOCYTESUR NEGATIVE 05/14/2023 1148   Sepsis Labs: @LABRCNTIP (procalcitonin:4,lacticidven:4)  )No results found for this or any previous visit (from the past 240 hours).   Radiology Studies: No results found.   Scheduled Meds:  amLODipine   2.5 mg Oral Daily   atorvastatin   40 mg Oral Daily   enoxaparin  (LOVENOX ) injection  30 mg Subcutaneous Q24H   fesoterodine   4 mg Oral Daily   fluticasone   2 spray Each Nare BID   hydrALAZINE   25 mg Oral Q8H   insulin  aspart  0-15 Units Subcutaneous TID WC   insulin  aspart  6 Units Subcutaneous TID WC   insulin  glargine-yfgn  35 Units Subcutaneous Daily   ipratropium-albuterol   3 mL Nebulization BID   isosorbide  mononitrate  60 mg Oral Daily   lactulose   20 g Oral BID  levothyroxine   25 mcg Oral Daily   metoprolol  succinate  12.5 mg Oral Daily   mirtazapine   15 mg Oral QHS   pantoprazole   40 mg Oral BID   predniSONE   40 mg Oral Q breakfast   sodium chloride  flush  3 mL Intravenous Q12H   Continuous Infusions:   LOS: 19 days    Time spent:    Deforest Fast, MD [facility]   06/02/2023, 10:57 AM

## 2023-06-02 NOTE — Progress Notes (Addendum)
   Patient Name: Mary Mccarthy Date of Encounter: 06/02/2023 [facility] Cardiologist: Randene Bustard, MD   Interval Summary  .    Net diuresis 1.5 L in last 24 hours, 14 L since admission, weight down about 17-20 pounds. Remains in sinus rhythm.   Has dyspnea with minimal activity.  Remains on oxygen  4 l by nasal cannula (O2 sat 80s on 2 L, 97% on 4 L).  Before this admission was on 2 L/minute. During diuresis, creatinine peaked at 3.09, now appears to have stabilized around 2.0 which is not far from her previous baseline estimated to be 1.5-1.7.  Vital Signs .    Vitals:   06/02/23 0623 06/02/23 0757 06/02/23 0758 06/02/23 0931  BP:  (!) 158/48    Pulse:  61  66  Resp:      Temp:  97.9 F (36.6 C)    TempSrc:  Oral    SpO2:   97%   Weight: 79 kg     Height:        Intake/Output Summary (Last 24 hours) at 06/02/2023 1116 Last data filed at 06/02/2023 0645 Gross per 24 hour  Intake 237 ml  Output 2025 ml  Net -1788 ml      06/02/2023    6:23 AM 05/31/2023    4:07 AM 05/30/2023    3:28 AM  Last 3 Weights  Weight (lbs) 174 lb 2.6 oz 185 lb 10 oz 182 lb 8.7 oz  Weight (kg) 79 kg 84.2 kg 82.8 kg      Telemetry/ECG    Normal sinus rhythm- Personally Reviewed  Physical Exam .   GEN: No acute distress.   Neck: No JVD Cardiac: Regular RRR, 2/6 early peaking aortic ejection murmur no systolic murmurs, rubs, or gallops.  Respiratory: Clear to auscultation bilaterally. GI: Soft, nontender, non-distended  MS: No edema  Assessment & Plan .     83 year old with history of persistent atrial fibrillation and chronic diastolic heart failure, previous TAVR, DM, mild obesity, admitted with acute diastolic heart failure exacerbation and suspicion for amiodarone  lung toxicity, possible organizing pneumonia. Improved after substantial diuresis but continues to have hypoxia.  Amiodarone  has been discontinued and she is corticosteroids in a very slow taper.  Echo  on this admission shows normal LVEF 60 to 65%, severe mitral calcification with mildly increased gradients (mean gradient 3.0 mmHg at heart rate 60 bpm), moderate pulmonary hypertension (estimated RSVP 54 mmHg), normal function of the TAVR prosthesis (mean gradient 9 mmHg, mild perivalvular leak)  Continues to make slow progress with diuresis and steroids.  Systolic blood pressure is slightly elevated, but she has a very low diastolic blood pressure.  Would not treat with any additional antihypertensive or heart failure medications  For questions or updates, please contact  [facility] Please consult [email or web address] for contact info under        Signed, Luana Rumple, MD

## 2023-06-03 ENCOUNTER — Inpatient Hospital Stay (HOSPITAL_COMMUNITY)

## 2023-06-03 DIAGNOSIS — I5033 Acute on chronic diastolic (congestive) heart failure: Secondary | ICD-10-CM | POA: Diagnosis not present

## 2023-06-03 LAB — BASIC METABOLIC PANEL WITH GFR
Anion gap: 9 (ref 5–15)
BUN: 57 mg/dL — ABNORMAL HIGH (ref 8–23)
CO2: 26 mmol/L (ref 22–32)
Calcium: 8.6 mg/dL — ABNORMAL LOW (ref 8.9–10.3)
Chloride: 99 mmol/L (ref 98–111)
Creatinine, Ser: 1.57 mg/dL — ABNORMAL HIGH (ref 0.44–1.00)
GFR, Estimated: 32 mL/min — ABNORMAL LOW (ref >60)
Glucose, Bld: 146 mg/dL — ABNORMAL HIGH (ref 70–99)
Potassium: 4.4 mmol/L (ref 3.5–5.1)
Sodium: 134 mmol/L — ABNORMAL LOW (ref 135–145)

## 2023-06-03 LAB — GLUCOSE, CAPILLARY
Glucose-Capillary: 128 mg/dL — ABNORMAL HIGH (ref 70–99)
Glucose-Capillary: 118 mg/dL — ABNORMAL HIGH (ref 70–99)
Glucose-Capillary: 200 mg/dL — ABNORMAL HIGH (ref 70–99)
Glucose-Capillary: 175 mg/dL — ABNORMAL HIGH (ref 70–99)

## 2023-06-03 LAB — BRAIN NATRIURETIC PEPTIDE: B Natriuretic Peptide: 322.8 pg/mL — ABNORMAL HIGH (ref 0.0–100.0)

## 2023-06-03 MED ORDER — FUROSEMIDE 10 MG/ML IJ SOLN
40.0000 mg | Freq: Once | INTRAMUSCULAR | Status: AC
  Administered 2023-06-03: 40 mg via INTRAVENOUS
  Filled 2023-06-03: qty 4

## 2023-06-03 NOTE — Progress Notes (Signed)
 PROGRESS NOTE    Mary Mccarthy  ZOX:[id number] DOB: 1940/03/08 DOA: 05/14/2023 PCP: Roslyn Coombe, MD  83 yo female with the past medical history of hypertension, hyperlipidemia, paroxysmal atrial fibrillation, aortic stenosis sp TAVR, T2DM and obesity who presented with dyspnea and lower extremity edema.  Found to have acute on chronic HFpEF with worsening hypoxia secondary to likely amiodarone  toxicity.  - 4/21 pulmonary consulted, amiodarone  discontinued and prednisone  taper over 2 to 4 weeks recommended  Subjective: - Desatted after coughing spell, required 15 L O2 this morning, now slowly weaning back down  Assessment and Plan:  Acute hypoxic respiratory failure -Multifactorial in the setting of chronic diastolic CHF, amiodarone  toxicity also suspected -On 2 L O2 at baseline, with significant hypoxia noted with minimal exertion, up to 10 L ambulating in the room  -Encouraged incentive spirometry, flutter valve, diuresis DuoNebs, remains on prednisone  - Repeat IV Lasix  today -Repeat x-ray unchanged, persistent bilateral airspace opacities   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  Evaluated by pulmonology, concern for amiodarone  toxicity.  Discontinued amiodarone , initiated corticosteroids.  Transitioned from IV Solu-Medrol  to prednisone  40 mg daily.  Slow taper over 2-4 weeks per pulmonology. -Continues to have issues with ongoing hypoxia -Will ask pulmonary to reassess tomorrow if significant hypoxia persists   Acute on chronic HFpEF - Diuresed with IV Lasix , 8.4 L negative - Cards following, may be close to euvolemic, creatinine improving, continue IV Lasix  again today to keep net negative - Creatinine slowly improving, GDMT limited by AKI/CKD   Acute kidney injury on CKD 3B - Baseline creatinine around 1.5, creatinine peaked at 3, now improving,  monitor  Diarrhea -Discontinued Ensure, improving, monitor   Persistent atrial fibrillation -Amiodarone  discontinued. -She has declined anticoagulation in the past  Diabetes mellitus - Increase glargine, meal coverage   CAD/hypertension - Continue metoprolol , Imdur , hydralazine , amlodipine .  Statin on board.  Not on aspirin .   Aortic stenosis s/p TAVR - Stable.   Possible aspiration pneumonia - Appears to be ruled out secondary to diagnosis above (amiodarone  toxicity).   Physical debilitation muscle weakness - Exacerbated by hypoxia and deconditioning.  Evaluated by PT/OT.  Currently working with [facility] on SNF placement.,  Patient declines SNF    DVT prophylaxis: Lovenox  Code Status: DNR Family Communication: None present Disposition Plan: Declines SNF, home likely 48 hours  Consultants:    Procedures:   Antimicrobials:    Objective: Vitals:   06/03/23 0448 06/03/23 0747 06/03/23 0847 06/03/23 0848  BP: (!) 155/68   (!) 147/45  Pulse: 61   70  Resp: 20   (!) 26  Temp: 98.5 F (36.9 C)   98.7 F (37.1 C)  TempSrc: Oral   Axillary  SpO2: 92% 92% 91% 91%  Weight: 82.1 kg     Height:        Intake/Output Summary (Last 24 hours) at 06/03/2023 1119 Last data filed at 06/03/2023 0900 Gross per 24 hour  Intake 540 ml  Output 1625 ml  Net -1085 ml   Filed Weights   05/31/23 0407 06/02/23 0623 06/03/23 0448  Weight: 84.2 kg 79 kg 82.1 kg    Examination:  General exam: Appears calm and comfortable  Respiratory system: Few basilar Rales Cardiovascular system: S1 & S2 heard, RRR.  Abd: nondistended, soft and nontender.Normal bowel sounds heard. Central nervous system: Alert and oriented. No focal neurological deficits. Extremities: Trace edema Skin: No rashes Psychiatry:  Mood &  affect appropriate.     Data Reviewed:   CBC: Recent Labs  Lab 05/30/23 0315 05/31/23 0256 06/01/23 0303 06/02/23 0316  WBC 13.0* 9.9 9.0 8.7  HGB 8.4* 7.9* 7.9* 7.8*   HCT 26.5* 24.8* 25.2* 25.1*  MCV 92.3 92.5 93.3 93.3  PLT 219 179 201 225   Basic Metabolic Panel: Recent Labs  Lab 05/28/23 0335 05/29/23 0812 05/30/23 0315 05/31/23 0256 06/01/23 0303 06/02/23 0316 06/03/23 0235  NA 132*   < > 134* 133* 134* 135 134*  K 4.4   < > 4.2 4.2 4.6 4.9 4.4  CL 91*   < > 95* 94* 97* 99 99  CO2 30   < > 30 30 31 30 26   GLUCOSE 232*   < > 112* 177* 210* 125* 146*  BUN 80*   < > 73* 72* 68* 58* 57*  CREATININE 3.05*   < > 2.11* 2.01* 1.91* 1.92* 1.57*  CALCIUM  8.8*   < > 8.8* 8.4* 8.4* 8.4* 8.6*  MG 2.4  --  2.5*  --   --   --   --    < > = values in this interval not displayed.   GFR: Estimated Creatinine Clearance: 25.9 mL/min (A) (by C-G formula based on SCr of 1.57 mg/dL (H)). Liver Function Tests: Recent Labs  Lab 05/30/23 0315  AST 97*  ALT 86*  ALKPHOS 66  BILITOT 0.9  PROT 6.1*  ALBUMIN 2.1*   No results for input(s): "LIPASE", "AMYLASE" in the last 168 hours. No results for input(s): "AMMONIA" in the last 168 hours. Coagulation Profile: No results for input(s): "INR", "PROTIME" in the last 168 hours. Cardiac Enzymes: No results for input(s): "CKTOTAL", "CKMB", "CKMBINDEX", "TROPONINI" in the last 168 hours. BNP (last 3 results) Recent Labs    03/14/23 0924  PROBNP 235.0*   HbA1C: No results for input(s): "HGBA1C" in the last 72 hours. CBG: Recent Labs  Lab 06/02/23 0802 06/02/23 1119 06/02/23 1616 06/02/23 2054 06/03/23 0612  GLUCAP 157* 227* 228* 184* 128*   Lipid Profile: No results for input(s): "CHOL", "HDL", "LDLCALC", "TRIG", "CHOLHDL", "LDLDIRECT" in the last 72 hours. Thyroid  Function Tests: No results for input(s): "TSH", "T4TOTAL", "FREET4", "T3FREE", "THYROIDAB" in the last 72 hours. Anemia Panel: No results for input(s): "VITAMINB12", "FOLATE", "FERRITIN", "TIBC", "IRON ", "RETICCTPCT" in the last 72 hours. Urine analysis:    Component Value Date/Time   COLORURINE YELLOW 05/14/2023 1148    APPEARANCEUR TURBID (A) 05/14/2023 1148   LABSPEC 1.014 05/14/2023 1148   PHURINE 5.0 05/14/2023 1148   GLUCOSEU NEGATIVE 05/14/2023 1148   GLUCOSEU NEGATIVE 03/15/2022 1217   HGBUR NEGATIVE 05/14/2023 1148   BILIRUBINUR NEGATIVE 05/14/2023 1148   KETONESUR NEGATIVE 05/14/2023 1148   PROTEINUR 30 (A) 05/14/2023 1148   UROBILINOGEN 0.2 03/15/2022 1217   NITRITE NEGATIVE 05/14/2023 1148   LEUKOCYTESUR NEGATIVE 05/14/2023 1148   Sepsis Labs: @LABRCNTIP (procalcitonin:4,lacticidven:4)  )No results found for this or any previous visit (from the past 240 hours).   Radiology Studies: DG Chest 2 View Result Date: 06/03/2023 CLINICAL DATA:  Hypoxia EXAM: CHEST - 2 VIEW COMPARISON:  05/27/2023 FINDINGS: Cardiac shadow is enlarged but stable. Changes of prior TAVR are seen. Persistent airspace opacities are again identified relatively stable when compared with the prior exam. No new focal abnormality is seen. IMPRESSION: Diffuse bilateral multifocal pneumonia. Electronically Signed   By: Violeta Grey M.D.   On: 06/03/2023 11:14     Scheduled Meds:  amLODipine   2.5 mg Oral Daily  atorvastatin   40 mg Oral Daily   enoxaparin  (LOVENOX ) injection  30 mg Subcutaneous Q24H   fesoterodine   4 mg Oral Daily   fluticasone   2 spray Each Nare BID   hydrALAZINE   25 mg Oral Q8H   insulin  aspart  0-15 Units Subcutaneous TID WC   insulin  aspart  6 Units Subcutaneous TID WC   insulin  glargine-yfgn  35 Units Subcutaneous Daily   ipratropium-albuterol   3 mL Nebulization BID   isosorbide  mononitrate  60 mg Oral Daily   lactulose   20 g Oral BID   levothyroxine   25 mcg Oral Daily   metoprolol  succinate  12.5 mg Oral Daily   mirtazapine   15 mg Oral QHS   pantoprazole   40 mg Oral BID   predniSONE   40 mg Oral Q breakfast   sodium chloride  flush  3 mL Intravenous Q12H   Continuous Infusions:   LOS: 20 days    Time spent:    Deforest Fast, MD [facility]   06/03/2023, 11:19 AM

## 2023-06-03 NOTE — Progress Notes (Signed)
 Patient was increased to 6L O2 from 3L O2 during breathing treatment by RT. RN entered room 30 min later and found patient satting in low 80s. O2 increased to 15L and RT called to assist. Dr. Drexel Gentles notified. MD states she is coming to assess patient.  Dr. Drexel Gentles decreased O2 to 8L and changed order to keep O2 sats above 88% instead of 92%.

## 2023-06-03 NOTE — Progress Notes (Signed)
 Notified Dr. Drexel Gentles that patient is again satting in low to mid 80s on 8L 02. Increased to 10L O2. No further orders received.

## 2023-06-03 NOTE — Plan of Care (Signed)
   Problem: Clinical Measurements: Goal: Respiratory complications will improve Outcome: Progressing   Problem: Coping: Goal: Level of anxiety will decrease Outcome: Progressing

## 2023-06-03 NOTE — Plan of Care (Signed)

## 2023-06-03 NOTE — Progress Notes (Signed)
   Patient Name: Mary Mccarthy Date of Encounter: 06/03/2023 [place] Mccarthy Cardiologist: Mary Bustard, MD   Interval Summary  .    Had a rough night due to worsening dyspnea. Now up to 8 L O2 by nasal cannula, despite net diuresis 1 more liter overnight.  Weight reportedly up, but I think yesterday's weight was inaccurate, still 10 pounds lower weight compared to admission. Creatinine has actually improved substantially, down to 1.57, which was her baseline before the hospitalization.  Vital Signs .    Vitals:   06/03/23 0448 06/03/23 0747 06/03/23 0847 06/03/23 0848  BP: (!) 155/68   (!) 147/45  Pulse: 61   70  Resp: 20   (!) 26  Temp: 98.5 F (36.9 C)   98.7 F (37.1 C)  TempSrc: Oral   Axillary  SpO2: 92% 92% 91% 91%  Weight: 82.1 kg     Height:        Intake/Output Summary (Last 24 hours) at 06/03/2023 1025 Last data filed at 06/03/2023 0900 Gross per 24 hour  Intake 540 ml  Output 1625 ml  Net -1085 ml      06/03/2023    4:48 AM 06/02/2023    6:23 AM 05/31/2023    4:07 AM  Last 3 Weights  Weight (lbs) 181 lb 1.6 oz 174 lb 2.6 oz 185 lb 10 oz  Weight (kg) 82.146 kg 79 kg 84.2 kg      Telemetry/ECG    Sinus rhythm- Personally Reviewed  Physical Exam .   GEN: No acute distress.   Neck: No JVD Cardiac: RRR, 2-3/6 aortic ejection murmur, no diastolic murmurs, rubs, or gallops.  Respiratory: Clear to auscultation bilaterally. GI: Soft, nontender, non-distended  MS: No edema.  Wrinkling skin on her legs.  Assessment & Plan .     Worsening hypoxemia despite negative fluid balance and no clinical findings to suggest hypervolemia.  Renal function will allow additional diuresis, but I do not think that is what the problem is.  Suspect worsening inflammatory pneumonitis.  May need to go back to intravenous dosing of her corticosteroids. Ordered a chest x-ray and BNP.  May need to get our pulmonary colleagues involved again.  For questions or  updates, please contact Mary Mccarthy Please consult [email or web address] for contact info under        Signed, Mary Rumple, MD

## 2023-06-04 ENCOUNTER — Encounter (HOSPITAL_COMMUNITY)

## 2023-06-04 DIAGNOSIS — T462X1A Poisoning by other antidysrhythmic drugs, accidental (unintentional), initial encounter: Secondary | ICD-10-CM | POA: Diagnosis not present

## 2023-06-04 DIAGNOSIS — J9620 Acute and chronic respiratory failure, unspecified whether with hypoxia or hypercapnia: Secondary | ICD-10-CM

## 2023-06-04 DIAGNOSIS — Z952 Presence of prosthetic heart valve: Secondary | ICD-10-CM | POA: Diagnosis not present

## 2023-06-04 DIAGNOSIS — I480 Paroxysmal atrial fibrillation: Secondary | ICD-10-CM | POA: Diagnosis not present

## 2023-06-04 DIAGNOSIS — I5033 Acute on chronic diastolic (congestive) heart failure: Secondary | ICD-10-CM | POA: Diagnosis not present

## 2023-06-04 LAB — BASIC METABOLIC PANEL WITH GFR
Anion gap: 8 (ref 5–15)
BUN: 50 mg/dL — ABNORMAL HIGH (ref 8–23)
CO2: 26 mmol/L (ref 22–32)
Calcium: 8.5 mg/dL — ABNORMAL LOW (ref 8.9–10.3)
Chloride: 101 mmol/L (ref 98–111)
Creatinine, Ser: 1.75 mg/dL — ABNORMAL HIGH (ref 0.44–1.00)
GFR, Estimated: 28 mL/min — ABNORMAL LOW (ref >60)
Glucose, Bld: 123 mg/dL — ABNORMAL HIGH (ref 70–99)
Potassium: 4.6 mmol/L (ref 3.5–5.1)
Sodium: 135 mmol/L (ref 135–145)

## 2023-06-04 LAB — GLUCOSE, CAPILLARY
Glucose-Capillary: 133 mg/dL — ABNORMAL HIGH (ref 70–99)
Glucose-Capillary: 127 mg/dL — ABNORMAL HIGH (ref 70–99)
Glucose-Capillary: 173 mg/dL — ABNORMAL HIGH (ref 70–99)
Glucose-Capillary: 219 mg/dL — ABNORMAL HIGH (ref 70–99)

## 2023-06-04 MED ORDER — AMLODIPINE BESYLATE 2.5 MG PO TABS
2.5000 mg | ORAL_TABLET | Freq: Once | ORAL | Status: AC
  Administered 2023-06-04: 2.5 mg via ORAL
  Filled 2023-06-04: qty 1

## 2023-06-04 MED ORDER — AMLODIPINE BESYLATE 5 MG PO TABS
5.0000 mg | ORAL_TABLET | Freq: Every day | ORAL | Status: DC
  Administered 2023-06-05 – 2023-06-07 (×3): 5 mg via ORAL
  Filled 2023-06-04 (×3): qty 1

## 2023-06-04 MED ORDER — FUROSEMIDE 10 MG/ML IJ SOLN
40.0000 mg | Freq: Three times a day (TID) | INTRAMUSCULAR | Status: AC
  Administered 2023-06-04 – 2023-06-05 (×3): 40 mg via INTRAVENOUS
  Filled 2023-06-04 (×3): qty 4

## 2023-06-04 NOTE — Progress Notes (Signed)
 Physical Therapy Treatment Patient Details Name: Mary Mccarthy MRN: [id number] DOB: 09-09-1940 Today's Date: 06/04/2023   History of Present Illness Patient is a 83 year old female with acute on chronic respiratory failure with hypoxia secondary to acute on chronic diastolic CHF. Pt found to have aspiration PNA. History of HTN, hyperlipidemia, respiratory failure on 2 L, PAF, GI bleed, aortic stenosis s/p TAVR,  transient heart block, diabetes mellitus type II, CAD, hypothyroidism, GERD    PT Comments  Today's session focused on gait training using rollator. Pt increased gait distance ambulating a total of ~33ft with CGA. She initially was maintaining SpO2 >88% on 10L O2 via [place]. Pt showed good awareness of endurance, utilizing PLB techniques throughout, and taking rest breaks as needed. Pt desaturated to 81% SpO2 requiring bump up to 15L and seated rest on rollator seat to recovery SpO2 >88%. Pt required minA to stand from rollator seat secondary to fatigue. She desaturated again to 82% SpO2 requiring bump back up to 15L in order to recover. Pt was unable to tolerate weaning back to 8L O2, left on 10L with RN notified. PT will continue to follow to maximize independence and decrease caregiver burden.  Pt is highly motivated to maximize her independence with functional mobility. Will continue to follow and advance appropriately.     If plan is discharge home, recommend the following: A lot of help with walking and/or transfers;A lot of help with bathing/dressing/bathroom;Assistance with cooking/housework;Assist for transportation;Help with stairs or ramp for entrance   Can travel by private vehicle     Yes  Equipment Recommendations  Wheelchair (measurements PT);Wheelchair cushion (measurements PT)    Recommendations for Other Services       Precautions / Restrictions Precautions Precautions: Fall Recall of Precautions/Restrictions: Intact Precaution/Restrictions Comments: watch  SpO2 Restrictions Weight Bearing Restrictions Per Provider Order: No     Mobility  Bed Mobility               General bed mobility comments: Not assessed. Pt greeted in recliner chair and returned there at end of session.    Transfers Overall transfer level: Needs assistance Equipment used: Rollator (4 wheels) Transfers: Sit to/from Stand Sit to Stand: Supervision, Min assist           General transfer comment: Pt stood from recliner chair without physical assist and demonstrated proper sequencing using rollator. Superverision for safety. Good eccentric control with sitting. Pt took a seated rest break on rollator seat and required CGA for safety and VC for technique to pivot within device. She required minA to power up from rollator seat d/t onset of fatigue with mobility.    Ambulation/Gait Ambulation/Gait assistance: Contact guard assist Gait Distance (Feet): 40 Feet (1x20, standing rest break, 1x40, seated rest break, 1x20) Assistive device: Rollator (4 wheels) Gait Pattern/deviations: Decreased step length - right, Decreased step length - left, Step-to pattern, Trunk flexed, Wide base of support Gait velocity: decreased Gait velocity interpretation: <1.31 ft/sec, indicative of household ambulator   General Gait Details: Pt ambulated with short slow steps. VC to increase step length with limited adjustment noted. Pt demonstrated adequate foot clearence, maintained herself within the rollator, locked/unlocked break appropriately, and navigated obstacles well. She had good insight into when she needed to take a rest break. No LOB.   Stairs             Wheelchair Mobility     Tilt Bed    Modified Rankin (Stroke Patients Only)  Balance Overall balance assessment: Needs assistance Sitting-balance support: Feet supported, No upper extremity supported, Bilateral upper extremity supported Sitting balance-[name]: Fair Sitting balance - Comments: Pt  seated in recliner chair. Scooted fwd/bkwd with BUE support.   Standing balance support: Bilateral upper extremity supported, During functional activity, Reliant on assistive device for balance Standing balance-[name]: Poor Standing balance comment: Pt dependent on rollator for stability.                            Communication Communication Communication: Impaired Factors Affecting Communication: Hearing impaired  Cognition Arousal: Alert Behavior During Therapy: WFL for tasks assessed/performed   PT - Cognitive impairments: No apparent impairments                         Following commands: Intact      Cueing Cueing Techniques: Verbal cues  Exercises      General Comments General comments (skin integrity, edema, etc.): Pt greeted on 8L O2 via  with SpO2 91%. She desaturated to 86% SpO2 on 8L while adjusting in the recliner chair. Bumped pt up to 10L prior to mobility. She tolerated the majority of gait, desaturating to 81% SpO2 on 10L when heading back towards room. Required seated rest break and bump up to 15L to recover >88% SpO2. Titrated back to 10L and continued gait to room and she desaturated again to 82% SpO2 requiring bump up to 15L to recover >88% SpO2. Attempted to titrated pt back to 8L, but she desaturated into the low 80s. Left pt on 10L O2, RN notified.      Pertinent Vitals/Pain Pain Assessment Pain Assessment: No/denies pain    Home Living                          Prior Function            PT Goals (current goals can now be found in the care plan section) Acute Rehab PT Goals Patient Stated Goal: Go on a walk without fatiguing Progress towards PT goals: Progressing toward goals    Frequency    Min 2X/week      PT Plan      Co-evaluation              AM-PAC PT "6 Clicks" Mobility   Outcome Measure  Help needed turning from your back to your side while in a flat bed without using bedrails?: A  Little Help needed moving from lying on your back to sitting on the side of a flat bed without using bedrails?: A Little Help needed moving to and from a bed to a chair (including a wheelchair)?: A Little Help needed standing up from a chair using your arms (e.g., wheelchair or bedside chair)?: A Little Help needed to walk in hospital room?: A Lot Help needed climbing 3-5 steps with a railing? : Total 6 Click Score: 15    End of Session Equipment Utilized During Treatment: Gait belt;Oxygen  Activity Tolerance: Patient tolerated treatment well;Patient limited by fatigue Patient left: in chair;with call bell/phone within reach Nurse Communication: Mobility status;Other (comment) (desaturation with activity) PT Visit Diagnosis: Other abnormalities of gait and mobility (R26.89);Muscle weakness (generalized) (M62.81);Difficulty in walking, not elsewhere classified (R26.2)     Time: 4098-1191 PT Time Calculation (min) (ACUTE ONLY): 28 min  Charges:    $Gait Training: 23-37 mins PT General Charges $$ ACUTE PT  VISIT: 1 Visit                     Glenford Lanes, PT, DPT Acute Rehabilitation Services Office: [phone] Secure Chat Preferred  Riva Chester 06/04/2023, 3:04 PM

## 2023-06-04 NOTE — Progress Notes (Signed)
 Heart Failure Nurse Navigator Progress Note    Patient HF TOC appointment on 06/08/2023 was cancelled per Dr. Drexel Gentles. Patient may be going to potential Hospice route. No HF TOC.   Navigator will sign off at this time.   Randie Bustle, BSN, Scientist, clinical (histocompatibility and immunogenetics) Only

## 2023-06-04 NOTE — Progress Notes (Signed)
 NAME:  Mary Mccarthy, MRN:  [id number], DOB:  May 24, 1940, LOS: 21 ADMISSION DATE:  05/14/2023, CONSULTATION DATE:  06/04/2023 REFERRING MD:  Dr. Arien - [facility], CHIEF COMPLAINT:  Hypoxia   History of Present Illness:  A 83 year old female with past medical history significant for DM, HLD, paroxysmal atrial fibrillation not on anticoagulation due to GI bleed, severe aortic stenosis s/p TAVR, CAD, type 2 diabetes, and anemia who presented to the ED initially 4/7 for complaints of shortness of breath.  Given concerns for acute on chronic respiratory failure in the setting of decompensated heart failure patient was admitted per hospitalist.  During admission thus far patient has been aggressively diuresed and is currently net -10 L for admission thus far.  However she continues to remain oxygen  dependent at which time amiodarone  lung toxicity was considered and steroids were started.  Pulmonary consulted 4/21 On chart review it appears that amiodarone  was started August 2022.  Anticoagulation was discontinued due to GI bleeding. She reports shortness of breath ongoing for few months worse for 2 weeks. Started Albuterol  3 weeks ago and HOT Feb 2025 with 3 L [place] No smoking hx or asthma Reports occasional cough with white sputum, no wheezing, f/c/r, or CP. Pertinent  Medical History  DM, HLD, paroxysmal atrial fibrillation not on anticoagulation due to GI bleed, severe aortic stenosis s/p TAVR, CAD, type 2 diabetes, and anemia of chronic illness  Significant Hospital Events: Including procedures, antibiotic start and stop dates in addition to other pertinent events     Interim History / Subjective:  4/7 admitted with complaints of shortness of breath, admitted for acute on chronic hypoxic respiratory failure secondary to decompensated CHF 4/8 cardiology consulted 4/11 Diuretics held due to AKI  4/17 Steroids started for concern of amio lung toxocity  4/20 CT chest  concerning for multifocal  pneumonia.    Objective   Blood pressure (!) 168/48, pulse (!) 56, temperature 97.8 F (36.6 C), resp. rate (!) 24, height 5\' 1"  (1.549 m), weight 81.7 kg, SpO2 92%.        Intake/Output Summary (Last 24 hours) at 06/04/2023 1219 Last data filed at 06/04/2023 0846 Gross per 24 hour  Intake 240 ml  Output 1300 ml  Net -1060 ml   Filed Weights   06/02/23 0623 06/03/23 0448 06/04/23 0540  Weight: 79 kg 82.1 kg 81.7 kg    Examination: General: alert, oriented x4, and comfortable. On 7 L [place]. SpO2 92%  HENT: PERL, normal pharynx and oral mucosa. No LNE or thyromegaly. No JVD Lungs: symmetrical air entry bilaterally. Diffuse crackles. No wheezing Cardiovascular: NL S2. ESM 3/6. No g/r Abdomen: no distension or tenderness Extremities: trace edema. 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 Amiodarone  was d/c 05/14/2023 Steroids started 05/24/2023, down to Prednisone  40 mg po daily (05/29/2023) Do not taper steroids for now Keep O2 for SpO2 >90% F/u up pulm clinic after 8 weeks when taper will be considered and to reassess oxygen  needs and repeat chest CT  IV Lasix  Fluid restriction Continue I/S and FV Am labs  Labs   CBC: Recent Labs  Lab 05/30/23 0315 05/31/23 0256 06/01/23 0303 06/02/23 0316  WBC 13.0* 9.9 9.0 8.7  HGB 8.4* 7.9* 7.9* 7.8*  HCT 26.5* 24.8* 25.2* 25.1*  MCV 92.3  92.5 93.3 93.3  PLT 219 179 201 225    Basic Metabolic Panel: Recent Labs  Lab 05/30/23 0315 05/31/23 0256 06/01/23 0303 06/02/23 0316 06/03/23 0235 06/04/23 0307  NA 134* 133* 134* 135 134* 135  K 4.2 4.2 4.6 4.9 4.4 4.6  CL 95* 94* 97* 99 99 101  CO2 30 30 31 30 26 26   GLUCOSE 112* 177* 210* 125* 146* 123*  BUN 73* 72* 68* 58* 57* 50*  CREATININE 2.11* 2.01* 1.91* 1.92* 1.57* 1.75*   CALCIUM  8.8* 8.4* 8.4* 8.4* 8.6* 8.5*  MG 2.5*  --   --   --   --   --    GFR: Estimated Creatinine Clearance: 23.2 mL/min (A) (by C-G formula based on SCr of 1.75 mg/dL (H)). Recent Labs  Lab 05/30/23 0315 05/31/23 0256 06/01/23 0303 06/02/23 0316  WBC 13.0* 9.9 9.0 8.7    Liver Function Tests: Recent Labs  Lab 05/30/23 0315  AST 97*  ALT 86*  ALKPHOS 66  BILITOT 0.9  PROT 6.1*  ALBUMIN 2.1*   No results for input(s): "LIPASE", "AMYLASE" in the last 168 hours. No results for input(s): "AMMONIA" in the last 168 hours.  ABG    Component Value Date/Time   PHART 7.446 10/26/2020 0503   PCO2ART 40.6 10/26/2020 0503   PO2ART 84.1 10/26/2020 0503   HCO3 27.6 10/26/2020 0503   TCO2 28 10/26/2020 0750   O2SAT 96.8 10/26/2020 0503     Coagulation Profile: No results for input(s): "INR", "PROTIME" in the last 168 hours.  Cardiac Enzymes: No results for input(s): "CKTOTAL", "CKMB", "CKMBINDEX", "TROPONINI" in the last 168 hours.  HbA1C: Hgb A1c MFr Bld  Date/Time Value Ref Range Status  03/14/2023 09:24 AM 6.5 4.6 - 6.5 % Final    Comment:    Glycemic Control Guidelines for People with Diabetes:Non Diabetic:  <6%Goal of Therapy: <7%Additional Action Suggested:  >8%   03/01/2023 02:41 AM 6.0 (H) 4.8 - 5.6 % Final    Comment:    (NOTE) Pre diabetes:          5.7%-6.4%  Diabetes:              >6.4%  Glycemic control for   <7.0% adults with diabetes     CBG: Recent Labs  Lab 06/03/23 1159 06/03/23 1530 06/03/23 2103 06/04/23 0619 06/04/23 1136  GLUCAP 118* 200* 175* 133* 127*    Review of Systems:   Review of Systems  Constitutional:  Negative for chills, diaphoresis and fever.  HENT:  Negative for congestion, sinus pain and sore throat.   Respiratory:  Positive for cough, sputum production and shortness of breath. Negative for hemoptysis, wheezing and stridor.   Cardiovascular:  Positive for orthopnea and leg swelling. Negative for chest pain and  PND.  Gastrointestinal:  Negative for abdominal pain, diarrhea, heartburn, nausea and vomiting.  Genitourinary:  Negative for dysuria.  Skin:  Negative for rash.     Past Medical History:  She,  has a past medical history of ALLERGIC RHINITIS (06/24/2007), Anemia, AVM (arteriovenous malformation) of colon, Blood transfusion without reported diagnosis (05/2015), COLONIC POLYPS, HX OF (06/24/2007), Coronary artery disease, non-occlusive (05/2015), DEPRESSION (10/08/2006), DIABETES MELLITUS, TYPE II (10/05/2006), GERD (06/24/2007), History of CVA (cerebrovascular accident) (08/10/2020), HYPERLIPIDEMIA (10/08/2006), HYPERTENSION (10/08/2006), INSOMNIA (09/24/2008), Left knee DJD, Nonsustained paroxysmal ventricular tachycardia (HCC) (10/2018), Osteoporosis (03/10/2016), PAF (paroxysmal atrial fibrillation) (HCC), PAF with RVR: CHA2DS2-VASc 9.  On Eliquis  and amiodarone . (08/11/2020), PEPTIC ULCER DISEASE (10/08/2006), S/P TAVR (transcatheter aortic valve  replacement) (10/26/2020), and Severe aortic stenosis (08/2020).   Surgical History:   Past Surgical History:  Procedure Laterality Date   APPENDECTOMY     BIOPSY  10/21/2020   Procedure: BIOPSY;  Surgeon: Daina Drum, MD;  Location: [facility] ENDOSCOPY;  Service: Gastroenterology;;   BREAST BIOPSY     CARDIAC CATHETERIZATION N/A 05/21/2015   Procedure: Left Heart Cath and Coronary Angiography;  Surgeon: Arleen Lacer, MD;  [facility] LAB;  Service: Cardiovascular;: Ost RI 60%, Ost RCA 30%, dLAD tapers to small vessel w/ 40-50%. Mildly elevated LVEDP. Normal LV Fxn.   COLONOSCOPY N/A 05/20/2015   Procedure: COLONOSCOPY;  Surgeon: Danette Duos, MD;  Location: [facility] ENDOSCOPY;  Service: Gastroenterology;  Laterality: N/A;   CORONARY CA2+ SCORE / CARDIAC CT ANGIOGRAM  10/09/2019   Calcium  score 624.  82nd percentile. Dominant RCA: Mild (25-49%) proximal stenosis-distal bifurcation into PDA and PAV--< RPL branches.  LAD (1 major mid  vessel diagonal) diffuse calcified plaque, mild proximal stenosis with minimal distal stenosis.  Small RI moderate ostial disease.  LCx-moderate mixed (50-69%) proximal stenosis.  Small dOM1 disease.  Trileaflet AoV, annular Ca2+ - probable AS   ESOPHAGOGASTRODUODENOSCOPY N/A 05/20/2015   Procedure: ESOPHAGOGASTRODUODENOSCOPY (EGD);  Surgeon: Danette Duos, MD;  Location: [facility] ENDOSCOPY;  Service: Gastroenterology;  Laterality: N/A;   ESOPHAGOGASTRODUODENOSCOPY (EGD) WITH PROPOFOL  N/A 10/21/2020   Procedure: ESOPHAGOGASTRODUODENOSCOPY (EGD) WITH PROPOFOL ;  Surgeon: Daina Drum, MD;  Location: [facility] ENDOSCOPY;  Service: Gastroenterology;  Laterality: N/A;   INTRAOPERATIVE TRANSTHORACIC ECHOCARDIOGRAM N/A 10/26/2020   Procedure: INTRAOPERATIVE TRANSTHORACIC ECHOCARDIOGRAM;  Surgeon: Abel Hoe; Location: [facility] OR; Pre-TAVR well-visualized calcified AoV mean Grad 37 mmHg, AVA 0.72 cm => Post TAVR well-positioned supra-annular 26 mm Medtronic Evolut Pro valve placed with no PVL.  Mean gradient 14 mmHg.  AVR 1.58 cm.  Normal flow to RCA and LM-LAD.  ; EF 60 to 65%.  Degenerative severe MAC w/ mild MR.   RIGHT/LEFT HEART CATH AND CORONARY ANGIOGRAPHY N/A 09/29/2020   Procedure: RIGHT/LEFT HEART CATH AND CORONARY ANGIOGRAPHY;  Surgeon: Odie Benne, MD;  Location: [facility] INVASIVE CV LAB;  Service: Cardiovascular; pre-TAVR:  pRCA 20%, m-dRCA 30%. D LM-pLAD 20%. RI 30%, mLAD 20%.  Severe AS with mean gradient measured at 52.8 mmHg.   TONSILLECTOMY     TRANSCATHETER AORTIC VALVE REPLACEMENT, TRANSFEMORAL N/A 10/26/2020   Procedure: TRANSCATHETER AORTIC VALVE REPLACEMENT, TRANSFEMORAL;  Surgeon: Odie Benne, MD;  Location: [facility] OR;  Service: Open Heart Surgery;; Medtronic Evolut-Pro + (size 26 mm, model # EVPROPLUS -26US, serial # [id number]); transient high-grade AV block noted so PPM left in place.   TRANSTHORACIC ECHOCARDIOGRAM  03/17/2019   a) 03/2019: EF 60 to 65%.  Moderate LVH.  GRII DD.   Mod-Severe AS (m grad 36 mmHg, peak 59 mmHg); b) 09/2019: EF 65-70%, No RWMA. Gr1 DD/hi LAP, Mild hi PAP. Mod LA Dil. MOD AS (mean Grad 34.5 mmHg).  STABLE   TRANSTHORACIC ECHOCARDIOGRAM  08/27/2020   Admitted for CVA/Afib RVR & CHF (Progression to SEVERE SYMPTOMATIC AS):  Severe Calcific Aortic Stenosis (VTI AVA estimated 0.86 cm, mean gradient 42 mmHg, V-max 4.36 m/s).  EF 55 to 60%.  Severe concentric LVH.  Unable to determine diastolic parameters.  Moderately elevated PAP.  Mild LA dilation.  Mild circumferential pericardial effusion.  Trivial MR.  Severe MAC.   TRANSTHORACIC ECHOCARDIOGRAM  11/25/2020   1 Month s/p TAVR: EF 55 to 60%.  No R WMA.  GR 1 DD.  Elevated LVEDP.  Normal RV size with mildly elevated RVP estimated 44 mmHg.  Oscillating density in the RV suspect calcified chordal apparatus versus calcified thrombus.  Moderate LA dilation.  Normal IVC/RA P => Well-positioned 26 mm Medtronic Evolut Pro THVwith mean AOV gradient 11 mmHg.  Trivial PVL   TRANSTHORACIC ECHOCARDIOGRAM  11/02/2020   a) Day 1 Post-op TAVR 10/27/20:  Well-Positioned Supra Annular Medtronic Evolut Pro THP.  Mean gradient 12 mmHg, peak 24 mmHg.  AVA 1.8 cm.  Trivial PVL.  EF 60 to 65%.  Normal LV function.  Mild LVH.  Severe LA dilation.  Mild RA dilation.  Severe MAC;; b) 11/02/20: Normal structure and function of the aortic valve prosthesis.  Mean gradient 9 mmHg (otherwise stable)   TUBAL LIGATION       Social History:   reports that she has never smoked. She has never used smokeless tobacco. She reports that she does not drink alcohol and does not use drugs.   Family History:  Her family history includes Alcohol abuse in an other family member; Arrhythmia in her sister; Arthritis in an other family member; CAD in her sister; Cancer in her mother; Depression in an other family member; Diabetes in an other family member; Heart attack (age of onset: 69) in her sister; Heart disease in an other family member;  Hyperlipidemia in an other family member; Hypertension in an other family member; Stroke in an other family member.   Allergies Allergies  Allergen Reactions   Ibuprofen Other (See Comments)    Bleeding events     Home Medications  Prior to Admission medications   Medication Sig Start Date End Date Taking? Authorizing Provider  acetaminophen  (TYLENOL ) 500 MG tablet Take 500 mg by mouth every 6 (six) hours as needed for moderate pain.   Yes [provider]  ALPRAZolam  (XANAX ) 0.25 MG tablet Take 1 tablet (0.25 mg total) by mouth 3 (three) times daily as needed for anxiety. 03/14/23  Yes Roslyn Coombe, MD  atorvastatin  (LIPITOR ) 40 MG tablet TAKE 1 TABLET BY MOUTH EVERY DAY 09/04/22  Yes Arleen Lacer, MD  [facility] VITAMIN B12 1000 MCG tablet TAKE 1 TABLET BY MOUTH EVERY DAY 03/26/23  Yes Roslyn Coombe, MD  furosemide  (LASIX ) 20 MG tablet 3 tabs by mouth once daily Patient taking differently: Take 80 mg by mouth daily. 3 tabs by mouth once daily 04/03/23  Yes Roslyn Coombe, MD  hydrALAZINE  (APRESOLINE ) 25 MG tablet Take 1 tablet (25 mg total) by mouth every 8 (eight) hours. 04/03/23  Yes Roslyn Coombe, MD  isosorbide  mononitrate (IMDUR ) 30 MG 24 hr tablet Take 0.5 tablets (15 mg total) by mouth daily. 04/03/23  Yes Roslyn Coombe, MD  levothyroxine  (SYNTHROID ) 25 MCG tablet TAKE 1 TABLET BY MOUTH EVERY DAY 03/26/23  Yes Roslyn Coombe, MD  metFORMIN  (GLUCOPHAGE ) 1000 MG tablet TAKE 1 TABLET BY MOUTH IN THE AM AND 1/2 IN THE PM 03/26/23  Yes Roslyn Coombe, MD  metoprolol  succinate (TOPROL -XL) 25 MG 24 hr tablet TAKE 1/2 TABLET BY MOUTH DAILY 03/26/23  Yes Roslyn Coombe, MD  mirtazapine  (REMERON ) 15 MG tablet TAKE 1 TABLET BY MOUTH EVERYDAY AT BEDTIME 10/30/22  Yes Roslyn Coombe, MD  pantoprazole  (PROTONIX ) 40 MG tablet TAKE 1 TABLET BY MOUTH TWICE A DAY 12/07/22  Yes Ardia Kraft, PA-C  solifenacin  (VESICARE ) 5 MG tablet Take 1 tablet (5 mg total) by mouth daily. 03/14/23  Yes Roslyn Coombe, MD   amiodarone  (  PACERONE ) 200 MG tablet TAKE 1 TABLET BY MOUTH EVERY DAY Patient not taking: Reported on 05/14/2023 02/06/23   Arnoldo Lapping, MD  blood glucose meter kit and supplies Dispense based on patient and insurance preference. Use up to four times daily as directed. E11.9 04/15/18   Roslyn Coombe, MD  Blood Glucose Monitoring Suppl (ONE TOUCH ULTRA 2) w/Device KIT Use as directed daily 06/01/15   Roslyn Coombe, MD  citalopram  (CELEXA ) 20 MG tablet Take 1 tablet (20 mg total) by mouth daily. Patient not taking: Reported on 05/14/2023 09/25/22   Roslyn Coombe, MD  Lancets [facility] DELICA PLUS [place]) MISC USE AS DIRECTED TEST 1-2 TIMES A DAY 09/05/20   Roslyn Coombe, MD  [facility] ULTRA test strip USE AS INSTRUCTED TEST 1-2 TIMES A DAY 10/12/20   Roslyn Coombe, MD        Arlyne Bering, MD [facility]

## 2023-06-04 NOTE — Progress Notes (Signed)
 Heart Failure Stewardship Pharmacist Progress Note   PCP: Roslyn Coombe, MD PCP-Cardiologist: Randene Bustard, MD    HPI:  83 yo F with PMH of HTN, HLD, T2DM, CKD3, CAD, paroxysmal afib, recurrent GI bleed, severe aortic stenosis s/p TAVR complicated with transient heart block, respiratory failure on 2L [place] at BL, hypothyroidism, GERD   Patient previously admitted 02/2023 for CHF exacerbation. Patient was diuresed during admission and home lasix  was increased from prn to daily.   Presented to the ED on 4/7 with worsening SOB with exertion and leg swelling over the last 2 weeks. Also presented with productive cough, chest pain/tightness, NVD, and wheezing. Reports PCP instructed her to take an additional dose of 20mg  lasix  in the setting of weight gain of 3 lb - no improvement per pt. Endorses right flank pain 2/2 coughing.   In the ED, patient was hypertensive and hypoxic (did not present on her home O2) wheezing, rhonchi, rales and BL LE edema present on exam. Slight murmur present. JVD Patient diuresed with lasix  40mg  x2 in the ED. CXR showed mild to moderate pulmonary vascular congestion. No dense consolidation or lung collapse. ECHO 4/8 showed LVEF 60-65% (unchanged from prior 11/2022) with normal LV function. Severe asymmetric LV hypertrophy of septal segment. LV diastolic function could not be evaluated. Normal RV function. Mildly enlarged LV. MV is degenerative with trivial regurgitation. Mild stenosis and severe annular calcifications also present. Mild to moderate tricuspid valve regurgitation. Cardiology was consulted, planning with diuresis for now. Repeat CXR 4/17 showing mild decrease in patchy multifocal airspace a nodular airspace. Opacities and cardiomegaly. Repeat CXR on 4/21 with coarse and asymmetric bilateral opacity greater on the left not significantly changed from 4/17, favor bilateral infection rather than edema. CT chest on 4/20 with diffuse multifocal patchy airspace and  ground glass opacities throughout both lungs. Findings most consistent with multifocal pneumonia. PCCM consulted and believe this is most consistent with amiodarone  toxicity - she is now improving off amiodarone  and on steroids. Repeat CXR 4/27 showed persistent airspaces opacitiies, stable from prior exam. Diffuse BL multifocal PNA.   Reports she is feeling a little better than yesterday.  Breathing still seems a bit labored. Reports that SOB is not consistent, but "comes and goes." Admits to utilizing the spirometry as much as she can but causes SOB sometimes. On 8L [place]. Expresses concern regarding logistics about GOC and has reservations about hospice and palliative care given that a few of her family members went that route and did not get adequate care. Patient requesting discussion with hospitalist regarding GOC.    Current HF Medications: Beta Blocker: metoprolol  succinate 12.5 mg daily  Other: isosorbide  mononitrate 60 mg daily, hydralazine  25 mg q8h  Prior to admission HF Medications: Diuretic: Lasix  60mg  daily Beta blocker: Metoprolol  succinate 12.5mg  daily  Other: Isosorbide  mononitrate 15mg  daily, hydralazine  25mg  q8h  Pertinent Lab Values: Serum creatinine 1.57>1.75 (BL 1.3-1.5), BUN 57>50, Potassium 4.4>4.6, Sodium 135, BNP 977.8, Magnesium  2.5 (4/23), A1c 6.5   Vital Signs: Weight: 180 lbs (admission weight: 191 lbs) - bed weights Blood pressure: 130-170/40s Heart rate: 50-60s I/O: net -1.1L yesterday; net -16L since admission 5L [place] >> 10L HFNC > 7L HFNC   Medication Assistance / Insurance Benefits Check: Does the patient have prescription insurance?  Yes Type of insurance plan: Medicare  Outpatient Pharmacy:  Prior to admission [facility]  Is the patient willing to use [facility] TOC pharmacy at discharge? Yes Is the patient willing to transition  their outpatient pharmacy to utilize a [facility] outpatient pharmacy? No    Assessment: 1. Acute on  chronic diastolic CHF (LVEF 60-65%). NYHA class 2-3 symptoms.Strict I/Os and daily weights. Keep K>4 and Mg>2. Overall oxygen  status not at baseline - amiodarone  toxicity per PCCM.  - S/p 40 mg IV x 1 yesterday. Creatinine worsened today.  - Continue hydralazine  25 mg q8h and Imdur  60 mg daily - consider increasing hydralazine  for BP control  - Agree with deferring SGLT2i given hx of 3 UTI in prior 1-2 months  - Continue metoprolol  succinate 12.5 mg daily - HR stable 50-70s.   Plan: 1) Medication changes recommended at this time: - Increase hydralazine  to 50mg  tid  - Monitor K trend - monitor O2  - f/u pulmonary recommendations   2) Patient assistance: - Contacted son and he is interested in utilizing [facility] for medication changes upon discharge  but would [facility] and remaining prescriptions  - Son reported that current medications are affordable  3)  Education  - Patient has been educated on current HF medications and potential additions to HF medication regimen - Patient verbalizes understanding that over the next few months, these medication doses may change and more medications may be added to optimize HF regimen - Patient has been educated on basic disease state pathophysiology and goals of therapy   Harvest Lineman, PharmD PGY1 Pharmacy Resident

## 2023-06-04 NOTE — Progress Notes (Signed)
 Rounding Note    Patient Name: Mary Mccarthy Date of Encounter: 06/04/2023  [facility] Cardiologist: Randene Bustard, MD   Subjective   Pt found sitting up in bed, down to 7L [place], diligently using IS, worse cough today, but she states her breathing is better than yesterday  Inpatient Medications    Scheduled Meds:  amLODipine   2.5 mg Oral Daily   atorvastatin   40 mg Oral Daily   enoxaparin  (LOVENOX ) injection  30 mg Subcutaneous Q24H   fesoterodine   4 mg Oral Daily   fluticasone   2 spray Each Nare BID   hydrALAZINE   25 mg Oral Q8H   insulin  aspart  0-15 Units Subcutaneous TID WC   insulin  aspart  6 Units Subcutaneous TID WC   insulin  glargine-yfgn  35 Units Subcutaneous Daily   isosorbide  mononitrate  60 mg Oral Daily   lactulose   20 g Oral BID   levothyroxine   25 mcg Oral Daily   metoprolol  succinate  12.5 mg Oral Daily   mirtazapine   15 mg Oral QHS   pantoprazole   40 mg Oral BID   predniSONE   40 mg Oral Q breakfast   sodium chloride  flush  3 mL Intravenous Q12H   Continuous Infusions:  PRN Meds: acetaminophen , albuterol , ALPRAZolam , guaiFENesin -dextromethorphan , sodium chloride  flush, trimethobenzamide    Vital Signs    Vitals:   06/03/23 2004 06/03/23 2352 06/04/23 0540 06/04/23 0725  BP: (!) 134/45 (!) 155/45 (!) 157/46 (!) 173/44  Pulse: (!) 58 61 60 (!) 56  Resp: 18 19 17  (!) 28  Temp: 98 F (36.7 C) 97.8 F (36.6 C) 97.8 F (36.6 C) 97.8 F (36.6 C)  TempSrc: Oral Oral Oral Oral  SpO2: 96% 93% 91% 90%  Weight:   81.7 kg   Height:        Intake/Output Summary (Last 24 hours) at 06/04/2023 0849 Last data filed at 06/04/2023 0846 Gross per 24 hour  Intake 300 ml  Output 1300 ml  Net -1000 ml      06/04/2023    5:40 AM 06/03/2023    4:48 AM 06/02/2023    6:23 AM  Last 3 Weights  Weight (lbs) 180 lb 1.9 oz 181 lb 1.6 oz 174 lb 2.6 oz  Weight (kg) 81.7 kg 82.146 kg 79 kg      Telemetry    Sinus to sinus bradycardia in the  50-60s - Personally Reviewed  ECG    No new tracings - Personally Reviewed  Physical Exam   GEN: No acute distress.   Neck: No JVD Cardiac: RRR, no murmurs, rubs, or gallops.  Respiratory: rhonchi, occasional crackle, respirations unlabored GI: Soft, nontender, non-distended  MS: No edema; No deformity. Neuro:  Nonfocal  Psych: Normal affect   Labs    High Sensitivity Troponin:   Recent Labs  Lab 05/14/23 0922 05/14/23 1340 05/27/23 0404  TROPONINIHS 35* 31* 16     Chemistry Recent Labs  Lab 05/30/23 0315 05/31/23 0256 06/02/23 0316 06/03/23 0235 06/04/23 0307  NA 134*   < > 135 134* 135  K 4.2   < > 4.9 4.4 4.6  CL 95*   < > 99 99 101  CO2 30   < > 30 26 26   GLUCOSE 112*   < > 125* 146* 123*  BUN 73*   < > 58* 57* 50*  CREATININE 2.11*   < > 1.92* 1.57* 1.75*  CALCIUM  8.8*   < > 8.4* 8.6* 8.5*  MG 2.5*  --   --   --   --  PROT 6.1*  --   --   --   --   ALBUMIN 2.1*  --   --   --   --   AST 97*  --   --   --   --   ALT 86*  --   --   --   --   ALKPHOS 66  --   --   --   --   BILITOT 0.9  --   --   --   --   GFRNONAA 23*   < > 25* 32* 28*  ANIONGAP 9   < > 6 9 8    < > = values in this interval not displayed.    Lipids No results for input(s): "CHOL", "TRIG", "HDL", "LABVLDL", "LDLCALC", "CHOLHDL" in the last 168 hours.  Hematology Recent Labs  Lab 05/31/23 0256 06/01/23 0303 06/02/23 0316  WBC 9.9 9.0 8.7  RBC 2.68* 2.70* 2.69*  HGB 7.9* 7.9* 7.8*  HCT 24.8* 25.2* 25.1*  MCV 92.5 93.3 93.3  MCH 29.5 29.3 29.0  MCHC 31.9 31.3 31.1  RDW 17.2* 17.0* 17.4*  PLT 179 201 225   Thyroid  No results for input(s): "TSH", "FREET4" in the last 168 hours.  BNP Recent Labs  Lab 05/29/23 0449 06/03/23 1217  BNP 591.7* 322.8*    DDimer No results for input(s): "DDIMER" in the last 168 hours.   Radiology    DG Chest 2 View Result Date: 06/03/2023 CLINICAL DATA:  Hypoxia EXAM: CHEST - 2 VIEW COMPARISON:  05/27/2023 FINDINGS: Cardiac shadow is enlarged  but stable. Changes of prior TAVR are seen. Persistent airspace opacities are again identified relatively stable when compared with the prior exam. No new focal abnormality is seen. IMPRESSION: Diffuse bilateral multifocal pneumonia. Electronically Signed   By: Violeta Grey M.D.   On: 06/03/2023 11:14    Cardiac Studies   Echo 05/15/23:  1. Left ventricular ejection fraction, by estimation, is 60 to 65%. The  left ventricle has normal function. The left ventricle has no regional  wall motion abnormalities. There is severe asymmetric left ventricular  hypertrophy of the septal segment. Left   ventricular diastolic function could not be evaluated.   2. Right ventricular systolic function is normal. The right ventricular  size is mildly enlarged. There is moderately elevated pulmonary artery  systolic pressure. The estimated right ventricular systolic pressure is  53.7 mmHg.   3. Left atrial size was mildly dilated.   4. Right atrial size was mildly dilated.   5. The mitral valve is degenerative. Trivial mitral valve regurgitation.  Mild mitral stenosis. The mean mitral valve gradient is 3.0 mmHg. Severe  mitral annular calcification.   6. Tricuspid valve regurgitation is mild to moderate.   7. Mild paravalvular leak adjacent to the intervalvular fibrosa. The  aortic valve has been repaired/replaced. Aortic valve regurgitation is  mild. No aortic stenosis is present. There is a 26 mm [employer] stented  (TAVR) valve present in the aortic  position. Procedure Date: 10/26/2020. Echo findings are consistent with  normal structure and function of the aortic valve prosthesis. Echo  findings are consistent with perivalvular leak of the aortic prosthesis.  Aortic regurgitation PHT measures 514 msec.   Aortic valve mean gradient measures 9.0 mmHg. Aortic valve Vmax measures  1.88 m/s.   8. The inferior vena cava is dilated in size with <50% respiratory  variability, suggesting right atrial pressure  of 15 mmHg.   Patient Profile     83  y.o. female with persistent atrial fibrillation, HFpEF, aortic stenosis status post TAVR, diabetes, obesity who was admitted on 05/14/2023 for acute on chronic diastolic heart failure.   Assessment & Plan    Acute on chronic diastolic heart failure CKD - sCr 1.75 (1.57) - last diuretic was 06/03/23 with 40 mg IV lasix  - will hold off on a lasix  today - BNP yesterday down to 323   Persistent atrial fibrillation - remains in sinus rhythm - amiodarone  discontinued due to concern for amiodarone  lung toxicity - no anticoagulation, pt declines   Pneumonitis Acute hypoxic respiratory failure - suspect related to amiodarone , which has been discontinued - question PNA/aspiration PNA - PNA felt less likely in the setting of high suspicion for amiodarone  toxicity - remains on prednisone  40 mg daily - she is doing IS diligently, coughing more today, down to 7 L [place], home dose is 2L [place]   Hypertension - hydralazine  25 mg TID, 60 mg imdur , 12.5 mg toprol  daily, 2.5 mg amlodipine         For questions or updates, please contact [facility] Please consult [email or web address] for contact info under        Signed, Lamond Pilot, PA  06/04/2023, 8:49 AM

## 2023-06-04 NOTE — Progress Notes (Addendum)
 PROGRESS NOTE    Mary Mccarthy  HYQ:[id number] DOB: [birth year] DOA: 05/14/2023 PCP: Roslyn Coombe, MD  83 yo female with the past medical history of hypertension, hyperlipidemia, paroxysmal atrial fibrillation, aortic stenosis sp TAVR, T2DM and obesity who presented with dyspnea and lower extremity edema.  Found to have acute on chronic HFpEF with worsening hypoxia secondary to likely amiodarone  toxicity.  - 4/21 pulmonary consulted, amiodarone  discontinued and prednisone  taper over 2 to 4 weeks recommended - Complicated by ongoing issues with hypoxemia despite diuresis  Subjective: - Now down to 6, 7 L of O2 at rest, with any activity including after coughing spell, she requires up to 15 L O2  Assessment and Plan:  Acute hypoxic respiratory failure -Multifactorial in the setting of chronic diastolic CHF, amiodarone  toxicity also suspected -On 2 L O2 at baseline, with significant hypoxia noted with minimal exertion -Encouraged incentive spirometry, flutter valve, diuresis DuoNebs, remains on prednisone  -Repeat x-ray unchanged, persistent bilateral airspace opacities -Concerned about extremely limited pulmonary reserve and persistence of significant hypoxemia, will request reeval from pulmonary, ?  Palliative care   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  Evaluated by pulmonology 4/21, concern for amiodarone  toxicity.  Discontinued amiodarone , initiated corticosteroids.  Transitioned from IV Solu-Medrol  to prednisone  40 mg daily.  Slow taper over 2-4 weeks per pulmonology. -Continues to have issues with ongoing hypoxia - See discussion above   Acute on chronic HFpEF - Diuresed with IV Lasix , 8.9 L negative - Cards following, felt to be euvolemic - Creatinine stabilizing, GDMT limited by AKI/CKD   Acute kidney injury on CKD 3B - Baseline creatinine around 1.5, creatinine  peaked at 3, now improving, monitor  Diarrhea -Discontinued Ensure, improving, monitor   Persistent atrial fibrillation -Amiodarone  discontinued. -She has declined anticoagulation in the past  Diabetes mellitus - Increase glargine, meal coverage   CAD/hypertension - Continue metoprolol , Imdur , hydralazine , amlodipine .  Statin on board.  Not on aspirin .   Aortic stenosis s/p TAVR - Stable.   Physical debilitation muscle weakness - Exacerbated by hypoxia and deconditioning.  Evaluated by PT/OT.  -Patient declines SNF    DVT prophylaxis: Lovenox  Code Status: DNR Family Communication: None present Disposition Plan: To be determined   Consultants:    Procedures:   Antimicrobials:    Objective: Vitals:   06/03/23 2352 06/04/23 0540 06/04/23 0725 06/04/23 1140  BP: (!) 155/45 (!) 157/46 (!) 173/44 (!) 168/48  Pulse: 61 60 (!) 56 (!) 56  Resp: 19 17 (!) 28 (!) 24  Temp: 97.8 F (36.6 C) 97.8 F (36.6 C) 97.8 F (36.6 C) 97.8 F (36.6 C)  TempSrc: Oral Oral Oral   SpO2: 93% 91% 90% 92%  Weight:  81.7 kg    Height:        Intake/Output Summary (Last 24 hours) at 06/04/2023 1207 Last data filed at 06/04/2023 0846 Gross per 24 hour  Intake 240 ml  Output 1300 ml  Net -1060 ml   Filed Weights   06/02/23 0623 06/03/23 0448 06/04/23 0540  Weight: 79 kg 82.1 kg 81.7 kg    Examination:  General exam: Elderly frail chronically ill female laying in bed, AO x 3 HEENT: No JVD CVS: S1-S2, regular rhythm Lungs: Few scattered rhonchi, rare Rales Abdomen: Soft, nontender, bowel sounds present  Extremities: Trace edema Skin: No rashes Psychiatry:  Mood & affect appropriate.     Data Reviewed:   CBC: Recent Labs  Lab  05/30/23 0315 05/31/23 0256 06/01/23 0303 06/02/23 0316  WBC 13.0* 9.9 9.0 8.7  HGB 8.4* 7.9* 7.9* 7.8*  HCT 26.5* 24.8* 25.2* 25.1*  MCV 92.3 92.5 93.3 93.3  PLT 219 179 201 225   Basic Metabolic Panel: Recent Labs  Lab 05/30/23 0315  05/31/23 0256 06/01/23 0303 06/02/23 0316 06/03/23 0235 06/04/23 0307  NA 134* 133* 134* 135 134* 135  K 4.2 4.2 4.6 4.9 4.4 4.6  CL 95* 94* 97* 99 99 101  CO2 30 30 31 30 26 26   GLUCOSE 112* 177* 210* 125* 146* 123*  BUN 73* 72* 68* 58* 57* 50*  CREATININE 2.11* 2.01* 1.91* 1.92* 1.57* 1.75*  CALCIUM  8.8* 8.4* 8.4* 8.4* 8.6* 8.5*  MG 2.5*  --   --   --   --   --    GFR: Estimated Creatinine Clearance: 23.2 mL/min (A) (by C-G formula based on SCr of 1.75 mg/dL (H)). Liver Function Tests: Recent Labs  Lab 05/30/23 0315  AST 97*  ALT 86*  ALKPHOS 66  BILITOT 0.9  PROT 6.1*  ALBUMIN 2.1*   No results for input(s): "LIPASE", "AMYLASE" in the last 168 hours. No results for input(s): "AMMONIA" in the last 168 hours. Coagulation Profile: No results for input(s): "INR", "PROTIME" in the last 168 hours. Cardiac Enzymes: No results for input(s): "CKTOTAL", "CKMB", "CKMBINDEX", "TROPONINI" in the last 168 hours. BNP (last 3 results) Recent Labs    03/14/23 0924  PROBNP 235.0*   HbA1C: No results for input(s): "HGBA1C" in the last 72 hours. CBG: Recent Labs  Lab 06/03/23 1159 06/03/23 1530 06/03/23 2103 06/04/23 0619 06/04/23 1136  GLUCAP 118* 200* 175* 133* 127*   Lipid Profile: No results for input(s): "CHOL", "HDL", "LDLCALC", "TRIG", "CHOLHDL", "LDLDIRECT" in the last 72 hours. Thyroid  Function Tests: No results for input(s): "TSH", "T4TOTAL", "FREET4", "T3FREE", "THYROIDAB" in the last 72 hours. Anemia Panel: No results for input(s): "VITAMINB12", "FOLATE", "FERRITIN", "TIBC", "IRON ", "RETICCTPCT" in the last 72 hours. Urine analysis:    Component Value Date/Time   COLORURINE YELLOW 05/14/2023 1148   APPEARANCEUR TURBID (A) 05/14/2023 1148   LABSPEC 1.014 05/14/2023 1148   PHURINE 5.0 05/14/2023 1148   GLUCOSEU NEGATIVE 05/14/2023 1148   GLUCOSEU NEGATIVE 03/15/2022 1217   HGBUR NEGATIVE 05/14/2023 1148   BILIRUBINUR NEGATIVE 05/14/2023 1148   KETONESUR  NEGATIVE 05/14/2023 1148   PROTEINUR 30 (A) 05/14/2023 1148   UROBILINOGEN 0.2 03/15/2022 1217   NITRITE NEGATIVE 05/14/2023 1148   LEUKOCYTESUR NEGATIVE 05/14/2023 1148   Sepsis Labs: @LABRCNTIP (procalcitonin:4,lacticidven:4)  )No results found for this or any previous visit (from the past 240 hours).   Radiology Studies: DG Chest 2 View Result Date: 06/03/2023 CLINICAL DATA:  Hypoxia EXAM: CHEST - 2 VIEW COMPARISON:  05/27/2023 FINDINGS: Cardiac shadow is enlarged but stable. Changes of prior TAVR are seen. Persistent airspace opacities are again identified relatively stable when compared with the prior exam. No new focal abnormality is seen. IMPRESSION: Diffuse bilateral multifocal pneumonia. Electronically Signed   By: Violeta Grey M.D.   On: 06/03/2023 11:14     Scheduled Meds:  amLODipine   2.5 mg Oral Daily   atorvastatin   40 mg Oral Daily   enoxaparin  (LOVENOX ) injection  30 mg Subcutaneous Q24H   fesoterodine   4 mg Oral Daily   fluticasone   2 spray Each Nare BID   hydrALAZINE   25 mg Oral Q8H   insulin  aspart  0-15 Units Subcutaneous TID WC   insulin  aspart  6 Units Subcutaneous TID  WC   insulin  glargine-yfgn  35 Units Subcutaneous Daily   isosorbide  mononitrate  60 mg Oral Daily   lactulose   20 g Oral BID   levothyroxine   25 mcg Oral Daily   metoprolol  succinate  12.5 mg Oral Daily   mirtazapine   15 mg Oral QHS   pantoprazole   40 mg Oral BID   predniSONE   40 mg Oral Q breakfast   sodium chloride  flush  3 mL Intravenous Q12H   Continuous Infusions:   LOS: 21 days    Time spent:    Deforest Fast, MD [facility]   06/04/2023, 12:07 PM

## 2023-06-05 DIAGNOSIS — I480 Paroxysmal atrial fibrillation: Secondary | ICD-10-CM | POA: Diagnosis not present

## 2023-06-05 DIAGNOSIS — Z952 Presence of prosthetic heart valve: Secondary | ICD-10-CM | POA: Diagnosis not present

## 2023-06-05 DIAGNOSIS — J9620 Acute and chronic respiratory failure, unspecified whether with hypoxia or hypercapnia: Secondary | ICD-10-CM | POA: Diagnosis not present

## 2023-06-05 DIAGNOSIS — T462X1A Poisoning by other antidysrhythmic drugs, accidental (unintentional), initial encounter: Secondary | ICD-10-CM | POA: Diagnosis not present

## 2023-06-05 DIAGNOSIS — I5033 Acute on chronic diastolic (congestive) heart failure: Secondary | ICD-10-CM | POA: Diagnosis not present

## 2023-06-05 LAB — CBC
HCT: 27.2 % — ABNORMAL LOW (ref 36.0–46.0)
Hemoglobin: 8.8 g/dL — ABNORMAL LOW (ref 12.0–15.0)
MCH: 30.2 pg (ref 26.0–34.0)
MCHC: 32.4 g/dL (ref 30.0–36.0)
MCV: 93.5 fL (ref 80.0–100.0)
Platelets: 252 10*3/uL (ref 150–400)
RBC: 2.91 MIL/uL — ABNORMAL LOW (ref 3.87–5.11)
RDW: 17.9 % — ABNORMAL HIGH (ref 11.5–15.5)
WBC: 7.7 10*3/uL (ref 4.0–10.5)
nRBC: 0 % (ref 0.0–0.2)

## 2023-06-05 LAB — BASIC METABOLIC PANEL WITH GFR
Anion gap: 9 (ref 5–15)
BUN: 49 mg/dL — ABNORMAL HIGH (ref 8–23)
CO2: 26 mmol/L (ref 22–32)
Calcium: 8.3 mg/dL — ABNORMAL LOW (ref 8.9–10.3)
Chloride: 102 mmol/L (ref 98–111)
Creatinine, Ser: 1.62 mg/dL — ABNORMAL HIGH (ref 0.44–1.00)
GFR, Estimated: 31 mL/min — ABNORMAL LOW (ref >60)
Glucose, Bld: 158 mg/dL — ABNORMAL HIGH (ref 70–99)
Potassium: 4.2 mmol/L (ref 3.5–5.1)
Sodium: 137 mmol/L (ref 135–145)

## 2023-06-05 LAB — GLUCOSE, CAPILLARY
Glucose-Capillary: 155 mg/dL — ABNORMAL HIGH (ref 70–99)
Glucose-Capillary: 159 mg/dL — ABNORMAL HIGH (ref 70–99)
Glucose-Capillary: 169 mg/dL — ABNORMAL HIGH (ref 70–99)
Glucose-Capillary: 168 mg/dL — ABNORMAL HIGH (ref 70–99)

## 2023-06-05 LAB — MAGNESIUM: Magnesium: 2.1 mg/dL (ref 1.7–2.4)

## 2023-06-05 LAB — PHOSPHORUS: Phosphorus: 2.5 mg/dL (ref 2.5–4.6)

## 2023-06-05 MED ORDER — FUROSEMIDE 10 MG/ML IJ SOLN
40.0000 mg | Freq: Three times a day (TID) | INTRAMUSCULAR | Status: AC
  Administered 2023-06-05 (×2): 40 mg via INTRAVENOUS
  Filled 2023-06-05 (×2): qty 4

## 2023-06-05 MED ORDER — POTASSIUM CHLORIDE CRYS ER 20 MEQ PO TBCR
40.0000 meq | EXTENDED_RELEASE_TABLET | Freq: Once | ORAL | Status: AC
  Administered 2023-06-05: 40 meq via ORAL
  Filled 2023-06-05: qty 2

## 2023-06-05 MED ORDER — HYDRALAZINE HCL 50 MG PO TABS
50.0000 mg | ORAL_TABLET | Freq: Three times a day (TID) | ORAL | Status: DC
  Administered 2023-06-05 – 2023-06-07 (×6): 50 mg via ORAL
  Filled 2023-06-05 (×6): qty 1

## 2023-06-05 NOTE — Plan of Care (Signed)
  Problem: Education: Goal: Knowledge of General Education information will improve Description: Including pain rating scale, medication(s)/side effects and non-pharmacologic comfort measures Outcome: Progressing   Problem: Health Behavior/Discharge Planning: Goal: Ability to manage health-related needs will improve Outcome: Progressing   Problem: Clinical Measurements: Goal: Respiratory complications will improve Outcome: Progressing   

## 2023-06-05 NOTE — Plan of Care (Signed)
 Problem: Education: Goal: Knowledge of General Education information will improve Description: Including pain rating scale, medication(s)/side effects and non-pharmacologic comfort measures 06/05/2023 1856 by Rema Care, RN Outcome: Progressing 06/05/2023 1820 by Rema Care, RN Outcome: Progressing   Problem: Health Behavior/Discharge Planning: Goal: Ability to manage health-related needs will improve 06/05/2023 1856 by Rema Care, RN Outcome: Progressing 06/05/2023 1820 by Rema Care, RN Outcome: Progressing   Problem: Clinical Measurements: Goal: Ability to maintain clinical measurements within normal limits will improve 06/05/2023 1856 by Rema Care, RN Outcome: Progressing 06/05/2023 1820 by Rema Care, RN Outcome: Progressing Goal: Will remain free from infection 06/05/2023 1856 by Rema Care, RN Outcome: Progressing 06/05/2023 1820 by Rema Care, RN Outcome: Progressing Goal: Diagnostic test results will improve 06/05/2023 1856 by Rema Care, RN Outcome: Progressing 06/05/2023 1820 by Rema Care, RN Outcome: Progressing Goal: Respiratory complications will improve 06/05/2023 1856 by Rema Care, RN Outcome: Progressing 06/05/2023 1820 by Rema Care, RN Outcome: Progressing Goal: Cardiovascular complication will be avoided 06/05/2023 1856 by Rema Care, RN Outcome: Progressing 06/05/2023 1820 by Rema Care, RN Outcome: Progressing   Problem: Activity: Goal: Risk for activity intolerance will decrease 06/05/2023 1856 by Rema Care, RN Outcome: Progressing 06/05/2023 1820 by Rema Care, RN Outcome: Progressing   Problem: Coping: Goal: Level of anxiety will decrease 06/05/2023 1856 by Rema Care, RN Outcome: Progressing 06/05/2023 1820 by Rema Care, RN Outcome: Progressing   Problem: Safety: Goal: Ability to remain free from injury will improve 06/05/2023 1856 by Rema Care,  RN Outcome: Progressing 06/05/2023 1820 by Rema Care, RN Outcome: Progressing   Problem: Skin Integrity: Goal: Risk for impaired skin integrity will decrease 06/05/2023 1856 by Rema Care, RN Outcome: Progressing 06/05/2023 1820 by Rema Care, RN Outcome: Progressing   Problem: Education: Goal: Ability to describe self-care measures that may prevent or decrease complications (Diabetes Survival Skills Education) will improve 06/05/2023 1856 by Rema Care, RN Outcome: Progressing 06/05/2023 1820 by Rema Care, RN Outcome: Progressing Goal: Individualized Educational Video(s) 06/05/2023 1856 by Rema Care, RN Outcome: Progressing 06/05/2023 1820 by Rema Care, RN Outcome: Progressing   Problem: Coping: Goal: Ability to adjust to condition or change in health will improve 06/05/2023 1856 by Rema Care, RN Outcome: Progressing 06/05/2023 1820 by Rema Care, RN Outcome: Progressing   Problem: Fluid Volume: Goal: Ability to maintain a balanced intake and output will improve 06/05/2023 1856 by Rema Care, RN Outcome: Progressing 06/05/2023 1820 by Rema Care, RN Outcome: Progressing   Problem: Health Behavior/Discharge Planning: Goal: Ability to identify and utilize available resources and services will improve 06/05/2023 1856 by Rema Care, RN Outcome: Progressing 06/05/2023 1820 by Rema Care, RN Outcome: Progressing Goal: Ability to manage health-related needs will improve 06/05/2023 1856 by Rema Care, RN Outcome: Progressing 06/05/2023 1820 by Rema Care, RN Outcome: Progressing   Problem: Metabolic: Goal: Ability to maintain appropriate glucose levels will improve 06/05/2023 1856 by Rema Care, RN Outcome: Progressing 06/05/2023 1820 by Rema Care, RN Outcome: Progressing   Problem: Nutritional: Goal: Progress toward achieving an optimal weight will improve 06/05/2023 1856 by Rema Care,  RN Outcome: Progressing 06/05/2023 1820 by Rema Care, RN Outcome: Progressing   Problem: Skin Integrity: Goal: Risk for impaired skin integrity will decrease 06/05/2023 1856 by Rema Care, RN Outcome: Progressing 06/05/2023 1820 by  Rema Care, RN Outcome: Progressing   Problem: Tissue Perfusion: Goal: Adequacy of tissue perfusion will improve 06/05/2023 1856 by Rema Care, RN Outcome: Progressing 06/05/2023 1820 by Rema Care, RN Outcome: Progressing

## 2023-06-05 NOTE — Progress Notes (Signed)
 NAME:  Mary Mccarthy, MRN:  [id number], DOB:  1940/05/23, LOS: 22 ADMISSION DATE:  05/14/2023, CONSULTATION DATE:  06/04/2023 REFERRING MD:  Dr. Arien - [facility], CHIEF COMPLAINT:  Hypoxia   History of Present Illness:  A 83 year old female with past medical history significant for DM, HLD, paroxysmal atrial fibrillation not on anticoagulation due to GI bleed, severe aortic stenosis s/p TAVR, CAD, type 2 diabetes, and anemia who presented to the ED initially 4/7 for complaints of shortness of breath.  Given concerns for acute on chronic respiratory failure in the setting of decompensated heart failure patient was admitted per hospitalist.  During admission thus far patient has been aggressively diuresed and is currently net -10 L for admission thus far.  However she continues to remain oxygen  dependent at which time amiodarone  lung toxicity was considered and steroids were started.  Pulmonary consulted 4/21 On chart review it appears that amiodarone  was started August 2022.  Anticoagulation was discontinued due to GI bleeding. She reports shortness of breath ongoing for few months worse for 2 weeks. Started Albuterol  3 weeks ago and HOT Feb 2025 with 3 L [place] No smoking hx or asthma Reports occasional cough with white sputum, no wheezing, f/c/r, or CP. Pertinent  Medical History  DM, HLD, paroxysmal atrial fibrillation not on anticoagulation due to GI bleed, severe aortic stenosis s/p TAVR, CAD, type 2 diabetes, and anemia of chronic illness  Significant Hospital Events: Including procedures, antibiotic start and stop dates in addition to other pertinent events     Interim History / Subjective:  4/7 admitted with complaints of shortness of breath, admitted for acute on chronic hypoxic respiratory failure secondary to decompensated CHF 4/8 cardiology consulted 4/11 Diuretics held due to AKI  4/17 Steroids started for concern of amio lung toxocity  4/20 CT chest  concerning for multifocal  pneumonia.  4/29 diuresed 2.5 L in 2 days, down to 6 L [place], SpO2 97%, dropped to 91% with ambulation to bathroom. Uses I/S and FV    Objective   Blood pressure (!) 145/45, pulse 62, temperature 97.7 F (36.5 C), temperature source Oral, resp. rate 19, height 5\' 1"  (1.549 m), weight 81 kg, SpO2 91%.        Intake/Output Summary (Last 24 hours) at 06/05/2023 1036 Last data filed at 06/05/2023 0900 Gross per 24 hour  Intake 720 ml  Output 2000 ml  Net -1280 ml   Filed Weights   06/03/23 0448 06/04/23 0540 06/05/23 0433  Weight: 82.1 kg 81.7 kg 81 kg    Examination: General: alert, oriented x4, and comfortable. On 7 L [place]. SpO2 92%  HENT: PERL, normal pharynx and oral mucosa. No LNE or thyromegaly. No JVD Lungs: symmetrical air entry bilaterally. Diffuse crackles. No wheezing Cardiovascular: NL S2. ESM 3/6. No g/r Abdomen: no distension or tenderness Extremities: trace edema. Symmetrical  Neuro: nonfocal    Resolved Hospital Problem list   AKI  Assessment & Plan:  Acute on chronic respiratory failure, HOT 3 L [place] since Feb 2025 D-CHF exacerbation (Echo EF 60%, LVH, RVSP 53.7 mmHg) Probable amiodarone  pulm toxicity Amiodarone  was d/c 05/14/2023 Steroids started 05/24/2023, down to Prednisone  40 mg po daily (05/29/2023) Do not taper steroids for now Keep O2 for SpO2 >90% F/u up pulm clinic after 8 weeks when taper will be considered and to reassess oxygen  needs and repeat chest CT  Continue IV Lasix  Fluid restriction Continue I/S and FV Am labs D/w Dr. Drexel Gentles   Labs   CBC: Recent  Labs  Lab 05/30/23 0315 05/31/23 0256 06/01/23 0303 06/02/23 0316 06/05/23 0253  WBC 13.0* 9.9 9.0 8.7 7.7  HGB 8.4* 7.9* 7.9* 7.8* 8.8*  HCT 26.5* 24.8* 25.2* 25.1* 27.2*  MCV 92.3 92.5 93.3 93.3 93.5  PLT 219 179 201 225 252    Basic Metabolic Panel: Recent Labs  Lab 05/30/23 0315 05/31/23 0256 06/01/23 0303 06/02/23 0316 06/03/23 0235 06/04/23 0307 06/05/23 0253  NA 134*   < >  134* 135 134* 135 137  K 4.2   < > 4.6 4.9 4.4 4.6 4.2  CL 95*   < > 97* 99 99 101 102  CO2 30   < > 31 30 26 26 26   GLUCOSE 112*   < > 210* 125* 146* 123* 158*  BUN 73*   < > 68* 58* 57* 50* 49*  CREATININE 2.11*   < > 1.91* 1.92* 1.57* 1.75* 1.62*  CALCIUM  8.8*   < > 8.4* 8.4* 8.6* 8.5* 8.3*  MG 2.5*  --   --   --   --   --  2.1  PHOS  --   --   --   --   --   --  2.5   < > = values in this interval not displayed.   GFR: Estimated Creatinine Clearance: 24.9 mL/min (A) (by C-G formula based on SCr of 1.62 mg/dL (H)). Recent Labs  Lab 05/31/23 0256 06/01/23 0303 06/02/23 0316 06/05/23 0253  WBC 9.9 9.0 8.7 7.7    Liver Function Tests: Recent Labs  Lab 05/30/23 0315  AST 97*  ALT 86*  ALKPHOS 66  BILITOT 0.9  PROT 6.1*  ALBUMIN 2.1*   No results for input(s): "LIPASE", "AMYLASE" in the last 168 hours. No results for input(s): "AMMONIA" in the last 168 hours.  ABG    Component Value Date/Time   PHART 7.446 10/26/2020 0503   PCO2ART 40.6 10/26/2020 0503   PO2ART 84.1 10/26/2020 0503   HCO3 27.6 10/26/2020 0503   TCO2 28 10/26/2020 0750   O2SAT 96.8 10/26/2020 0503     Coagulation Profile: No results for input(s): "INR", "PROTIME" in the last 168 hours.  Cardiac Enzymes: No results for input(s): "CKTOTAL", "CKMB", "CKMBINDEX", "TROPONINI" in the last 168 hours.  HbA1C: Hgb A1c MFr Bld  Date/Time Value Ref Range Status  03/14/2023 09:24 AM 6.5 4.6 - 6.5 % Final    Comment:    Glycemic Control Guidelines for People with Diabetes:Non Diabetic:  <6%Goal of Therapy: <7%Additional Action Suggested:  >8%   03/01/2023 02:41 AM 6.0 (H) 4.8 - 5.6 % Final    Comment:    (NOTE) Pre diabetes:          5.7%-6.4%  Diabetes:              >6.4%  Glycemic control for   <7.0% adults with diabetes     CBG: Recent Labs  Lab 06/04/23 0619 06/04/23 1136 06/04/23 1607 06/04/23 2101 06/05/23 0617  GLUCAP 133* 127* 173* 219* 155*    Review of Systems:   Review of  Systems  Constitutional:  Negative for chills, diaphoresis and fever.  HENT:  Negative for congestion, sinus pain and sore throat.   Respiratory:  Positive for cough, sputum production and shortness of breath. Negative for hemoptysis, wheezing and stridor.   Cardiovascular:  Positive for orthopnea and leg swelling. Negative for chest pain and PND.  Gastrointestinal:  Negative for abdominal pain, diarrhea, heartburn, nausea and vomiting.  Genitourinary:  Negative  for dysuria.  Skin:  Negative for rash.     Past Medical History:  She,  has a past medical history of ALLERGIC RHINITIS (06/24/2007), Anemia, AVM (arteriovenous malformation) of colon, Blood transfusion without reported diagnosis (05/2015), COLONIC POLYPS, HX OF (06/24/2007), Coronary artery disease, non-occlusive (05/2015), DEPRESSION (10/08/2006), DIABETES MELLITUS, TYPE II (10/05/2006), GERD (06/24/2007), History of CVA (cerebrovascular accident) (08/10/2020), HYPERLIPIDEMIA (10/08/2006), HYPERTENSION (10/08/2006), INSOMNIA (09/24/2008), Left knee DJD, Nonsustained paroxysmal ventricular tachycardia (HCC) (10/2018), Osteoporosis (03/10/2016), PAF (paroxysmal atrial fibrillation) (HCC), PAF with RVR: CHA2DS2-VASc 9.  On Eliquis  and amiodarone . (08/11/2020), PEPTIC ULCER DISEASE (10/08/2006), S/P TAVR (transcatheter aortic valve replacement) (10/26/2020), and Severe aortic stenosis (08/2020).   Surgical History:   Past Surgical History:  Procedure Laterality Date   APPENDECTOMY     BIOPSY  10/21/2020   Procedure: BIOPSY;  Surgeon: Daina Drum, MD;  Location: [facility] ENDOSCOPY;  Service: Gastroenterology;;   BREAST BIOPSY     CARDIAC CATHETERIZATION N/A 05/21/2015   Procedure: Left Heart Cath and Coronary Angiography;  Surgeon: Arleen Lacer, MD;  Location: [facility] INVASIVE CV LAB;  Service: Cardiovascular;: Ost RI 60%, Ost RCA 30%, dLAD tapers to small vessel w/ 40-50%. Mildly elevated LVEDP. Normal LV Fxn.   COLONOSCOPY N/A 05/20/2015    Procedure: COLONOSCOPY;  Surgeon: Danette Duos, MD;  Location: [facility] ENDOSCOPY;  Service: Gastroenterology;  Laterality: N/A;   CORONARY CA2+ SCORE / CARDIAC CT ANGIOGRAM  10/09/2019   Calcium  score 624.  82nd percentile. Dominant RCA: Mild (25-49%) proximal stenosis-distal bifurcation into PDA and PAV--< RPL branches.  LAD (1 major mid vessel diagonal) diffuse calcified plaque, mild proximal stenosis with minimal distal stenosis.  Small RI moderate ostial disease.  LCx-moderate mixed (50-69%) proximal stenosis.  Small dOM1 disease.  Trileaflet AoV, annular Ca2+ - probable AS   ESOPHAGOGASTRODUODENOSCOPY N/A 05/20/2015   Procedure: ESOPHAGOGASTRODUODENOSCOPY (EGD);  Surgeon: Danette Duos, MD;  Location: [facility] ENDOSCOPY;  Service: Gastroenterology;  Laterality: N/A;   ESOPHAGOGASTRODUODENOSCOPY (EGD) WITH PROPOFOL  N/A 10/21/2020   Procedure: ESOPHAGOGASTRODUODENOSCOPY (EGD) WITH PROPOFOL ;  Surgeon: Daina Drum, MD;  Location: [facility] ENDOSCOPY;  Service: Gastroenterology;  Laterality: N/A;   INTRAOPERATIVE TRANSTHORACIC ECHOCARDIOGRAM N/A 10/26/2020   Procedure: INTRAOPERATIVE TRANSTHORACIC ECHOCARDIOGRAM;  Surgeon: Abel Hoe; Location: [facility] OR; Pre-TAVR well-visualized calcified AoV mean Grad 37 mmHg, AVA 0.72 cm => Post TAVR well-positioned supra-annular 26 mm Medtronic Evolut Pro valve placed with no PVL.  Mean gradient 14 mmHg.  AVR 1.58 cm.  Normal flow to RCA and LM-LAD.  ; EF 60 to 65%.  Degenerative severe MAC w/ mild MR.   RIGHT/LEFT HEART CATH AND CORONARY ANGIOGRAPHY N/A 09/29/2020   Procedure: RIGHT/LEFT HEART CATH AND CORONARY ANGIOGRAPHY;  Surgeon: Odie Benne, MD;  Location: [facility] INVASIVE CV LAB;  Service: Cardiovascular; pre-TAVR:  pRCA 20%, m-dRCA 30%. D LM-pLAD 20%. RI 30%, mLAD 20%.  Severe AS with mean gradient measured at 52.8 mmHg.   TONSILLECTOMY     TRANSCATHETER AORTIC VALVE REPLACEMENT, TRANSFEMORAL N/A 10/26/2020   Procedure: TRANSCATHETER AORTIC VALVE  REPLACEMENT, TRANSFEMORAL;  Surgeon: Odie Benne, MD;  Location: [facility] OR;  Service: Open Heart Surgery;; Medtronic Evolut-Pro + (size 26 mm, model # EVPROPLUS -26US, serial # [id number]); transient high-grade AV block noted so PPM left in place.   TRANSTHORACIC ECHOCARDIOGRAM  03/17/2019   a) 03/2019: EF 60 to 65%.  Moderate LVH.  GRII DD.  Mod-Severe AS (m grad 36 mmHg, peak 59 mmHg); b) 09/2019: EF 65-70%, No RWMA. Gr1 DD/hi LAP, Mild hi PAP. Mod LA  Dil. MOD AS (mean Grad 34.5 mmHg).  STABLE   TRANSTHORACIC ECHOCARDIOGRAM  08/27/2020   Admitted for CVA/Afib RVR & CHF (Progression to SEVERE SYMPTOMATIC AS):  Severe Calcific Aortic Stenosis (VTI AVA estimated 0.86 cm, mean gradient 42 mmHg, V-max 4.36 m/s).  EF 55 to 60%.  Severe concentric LVH.  Unable to determine diastolic parameters.  Moderately elevated PAP.  Mild LA dilation.  Mild circumferential pericardial effusion.  Trivial MR.  Severe MAC.   TRANSTHORACIC ECHOCARDIOGRAM  11/25/2020   1 Month s/p TAVR: EF 55 to 60%.  No R WMA.  GR 1 DD.  Elevated LVEDP.  Normal RV size with mildly elevated RVP estimated 44 mmHg.  Oscillating density in the RV suspect calcified chordal apparatus versus calcified thrombus.  Moderate LA dilation.  Normal IVC/RA P => Well-positioned 26 mm Medtronic Evolut Pro THVwith mean AOV gradient 11 mmHg.  Trivial PVL   TRANSTHORACIC ECHOCARDIOGRAM  11/02/2020   a) Day 1 Post-op TAVR 10/27/20:  Well-Positioned Supra Annular Medtronic Evolut Pro THP.  Mean gradient 12 mmHg, peak 24 mmHg.  AVA 1.8 cm.  Trivial PVL.  EF 60 to 65%.  Normal LV function.  Mild LVH.  Severe LA dilation.  Mild RA dilation.  Severe MAC;; b) 11/02/20: Normal structure and function of the aortic valve prosthesis.  Mean gradient 9 mmHg (otherwise stable)   TUBAL LIGATION       Social History:   reports that she has never smoked. She has never used smokeless tobacco. She reports that she does not drink alcohol and does not use drugs.   Family  History:  Her family history includes Alcohol abuse in an other family member; Arrhythmia in her sister; Arthritis in an other family member; CAD in her sister; Cancer in her mother; Depression in an other family member; Diabetes in an other family member; Heart attack (age of onset: 41) in her sister; Heart disease in an other family member; Hyperlipidemia in an other family member; Hypertension in an other family member; Stroke in an other family member.   Allergies Allergies  Allergen Reactions   Ibuprofen Other (See Comments)    Bleeding events   Amiodarone  Other (See Comments)    Lung toxicity     Home Medications  Prior to Admission medications   Medication Sig Start Date End Date Taking? Authorizing Provider  acetaminophen  (TYLENOL ) 500 MG tablet Take 500 mg by mouth every 6 (six) hours as needed for moderate pain.   Yes [provider]  ALPRAZolam  (XANAX ) 0.25 MG tablet Take 1 tablet (0.25 mg total) by mouth 3 (three) times daily as needed for anxiety. 03/14/23  Yes Roslyn Coombe, MD  atorvastatin  (LIPITOR ) 40 MG tablet TAKE 1 TABLET BY MOUTH EVERY DAY 09/04/22  Yes Arleen Lacer, MD  [facility] VITAMIN B12 1000 MCG tablet TAKE 1 TABLET BY MOUTH EVERY DAY 03/26/23  Yes Roslyn Coombe, MD  furosemide  (LASIX ) 20 MG tablet 3 tabs by mouth once daily Patient taking differently: Take 80 mg by mouth daily. 3 tabs by mouth once daily 04/03/23  Yes Roslyn Coombe, MD  hydrALAZINE  (APRESOLINE ) 25 MG tablet Take 1 tablet (25 mg total) by mouth every 8 (eight) hours. 04/03/23  Yes Roslyn Coombe, MD  isosorbide  mononitrate (IMDUR ) 30 MG 24 hr tablet Take 0.5 tablets (15 mg total) by mouth daily. 04/03/23  Yes Roslyn Coombe, MD  levothyroxine  (SYNTHROID ) 25 MCG tablet TAKE 1 TABLET BY MOUTH EVERY DAY 03/26/23  Yes Autry Legions,  Alveda Aures, MD  metFORMIN  (GLUCOPHAGE ) 1000 MG tablet TAKE 1 TABLET BY MOUTH IN THE AM AND 1/2 IN THE PM 03/26/23  Yes Roslyn Coombe, MD  metoprolol  succinate (TOPROL -XL) 25 MG 24 hr  tablet TAKE 1/2 TABLET BY MOUTH DAILY 03/26/23  Yes Roslyn Coombe, MD  mirtazapine  (REMERON ) 15 MG tablet TAKE 1 TABLET BY MOUTH EVERYDAY AT BEDTIME 10/30/22  Yes Roslyn Coombe, MD  pantoprazole  (PROTONIX ) 40 MG tablet TAKE 1 TABLET BY MOUTH TWICE A DAY 12/07/22  Yes Ardia Kraft, PA-C  solifenacin  (VESICARE ) 5 MG tablet Take 1 tablet (5 mg total) by mouth daily. 03/14/23  Yes Roslyn Coombe, MD  amiodarone  (PACERONE ) 200 MG tablet TAKE 1 TABLET BY MOUTH EVERY DAY Patient not taking: Reported on 05/14/2023 02/06/23   Arnoldo Lapping, MD  blood glucose meter kit and supplies Dispense based on patient and insurance preference. Use up to four times daily as directed. E11.9 04/15/18   Roslyn Coombe, MD  Blood Glucose Monitoring Suppl (ONE TOUCH ULTRA 2) w/Device KIT Use as directed daily 06/01/15   Roslyn Coombe, MD  citalopram  (CELEXA ) 20 MG tablet Take 1 tablet (20 mg total) by mouth daily. Patient not taking: Reported on 05/14/2023 09/25/22   Roslyn Coombe, MD  Lancets [facility] DELICA PLUS [place]) MISC USE AS DIRECTED TEST 1-2 TIMES A DAY 09/05/20   Roslyn Coombe, MD  [facility] ULTRA test strip USE AS INSTRUCTED TEST 1-2 TIMES A DAY 10/12/20   Roslyn Coombe, MD        Arlyne Bering, MD [facility]

## 2023-06-05 NOTE — Progress Notes (Signed)
 Heart Failure Stewardship Pharmacist Progress Note   PCP: Roslyn Coombe, MD PCP-Cardiologist: Randene Bustard, MD    HPI:  83 yo F with PMH of HTN, HLD, T2DM, CKD3, CAD, paroxysmal afib, recurrent GI bleed, severe aortic stenosis s/p TAVR complicated with transient heart block, respiratory failure on 2L [place] at BL, hypothyroidism, GERD   Patient previously admitted 02/2023 for CHF exacerbation. Patient was diuresed during admission and home lasix  was increased from prn to daily.   Presented to the ED on 4/7 with worsening SOB with exertion and leg swelling over the last 2 weeks. Also presented with productive cough, chest pain/tightness, NVD, and wheezing. Reports PCP instructed her to take an additional dose of 20mg  lasix  in the setting of weight gain of 3 lb - no improvement per pt. Endorses right flank pain 2/2 coughing.   In the ED, patient was hypertensive and hypoxic (did not present on her home O2) wheezing, rhonchi, rales and BL LE edema present on exam. Slight murmur present. JVD Patient diuresed with lasix  40mg  x2 in the ED. CXR showed mild to moderate pulmonary vascular congestion. No dense consolidation or lung collapse. ECHO 4/8 showed LVEF 60-65% (unchanged from prior 11/2022) with normal LV function. Severe asymmetric LV hypertrophy of septal segment. LV diastolic function could not be evaluated. Normal RV function. Mildly enlarged LV. MV is degenerative with trivial regurgitation. Mild stenosis and severe annular calcifications also present. Mild to moderate tricuspid valve regurgitation. Cardiology was consulted, planning with diuresis for now. Repeat CXR 4/17 showing mild decrease in patchy multifocal airspace a nodular airspace. Opacities and cardiomegaly. Repeat CXR on 4/21 with coarse and asymmetric bilateral opacity greater on the left not significantly changed from 4/17, favor bilateral infection rather than edema. CT chest on 4/20 with diffuse multifocal patchy airspace and  ground glass opacities throughout both lungs. Findings most consistent with multifocal pneumonia. PCCM consulted and believe this is most consistent with amiodarone  toxicity - she is now improving off amiodarone  and on steroids. Repeat CXR 4/27 showed persistent airspaces opacitiies, stable from prior exam. Diffuse BL multifocal PNA.   Reports she is feeling a little better than yesterday.  Breathing still seems a bit labored. Reports that SOB is the same as yesterday and is still using the spirometry every hour. Trace pitting edema on exam. Patient aware of plan to continuing lasix  today. Patient noted that palliative care will speak with her today to address questions she has regarding GOC decisions.    Current HF Medications: Beta Blocker: metoprolol  succinate 12.5 mg daily  Other: isosorbide  mononitrate 60 mg daily, hydralazine  25 mg q8h  Prior to admission HF Medications: Diuretic: Lasix  60mg  daily Beta blocker: Metoprolol  succinate 12.5mg  daily  Other: Isosorbide  mononitrate 15mg  daily, hydralazine  25mg  q8h  Pertinent Lab Values: Serum creatinine 1.57>1.75>1.62 (BL 1.3-1.5), BUN 57>50>49, Potassium 4.4>4.6>4.2, Sodium 137, BNP 977.8, Magnesium  2.5>2.1 (4/23), A1c 6.5   Vital Signs: Weight: 178 lbs (admission weight: 191 lbs) - standing weights Blood pressure: 140-160/40s Heart rate: 50-60s I/O: net - yesterday; net -17.6L since admission 5L [place] >> 10L HFNC > 7L HFNC > 8L [place]   Medication Assistance / Insurance Benefits Check: Does the patient have prescription insurance?  Yes Type of insurance plan: Medicare  Outpatient Pharmacy:  Prior to admission [facility]  Is the patient willing to use [facility] TOC pharmacy at discharge? Yes Is the patient willing to transition their outpatient pharmacy to utilize a [facility] outpatient pharmacy? No    Assessment:  1. Acute on chronic diastolic CHF (LVEF 60-65%). NYHA class 2-3 symptoms.Strict I/Os and daily  weights. Keep K>4 and Mg>2. Overall oxygen  status not at baseline - amiodarone  toxicity per PCCM. Concern for multifocal PNA and planning f/u with pulm clinic 8 weeks post discharge for steroid taper and o2 eval.  - S/p 40 mg IV x 3 4/29. Creatinine improved today. Consider continuing lasix  given signs of fluid retention.  - Continue hydralazine  25 mg q8h and Imdur  60 mg daily - consider increasing hydralazine  for BP control  - Agree with deferring SGLT2i given hx of 3 UTI in prior 1-2 months  - Continue metoprolol  succinate 12.5 mg daily - HR stable 50-60s.   Plan: 1) Medication changes recommended at this time: - Increase hydralazine  to 50mg  tid  - Continue IV Lasix  40mg  BID  - monitor O2   2) Patient assistance: - Contacted son and he is interested in utilizing [facility] for medication changes upon discharge  but would [facility] and remaining prescriptions  - Son reported that current medications are affordable  3)  Education  - Patient has been educated on current HF medications and potential additions to HF medication regimen - Patient verbalizes understanding that over the next few months, these medication doses may change and more medications may be added to optimize HF regimen - Patient has been educated on basic disease state pathophysiology and goals of therapy   Harvest Lineman, PharmD PGY1 Pharmacy Resident

## 2023-06-05 NOTE — Plan of Care (Signed)
  Problem: Education: Goal: Knowledge of General Education information will improve Description: Including pain rating scale, medication(s)/side effects and non-pharmacologic comfort measures Outcome: Progressing   Problem: Health Behavior/Discharge Planning: Goal: Ability to manage health-related needs will improve Outcome: Progressing   Problem: Clinical Measurements: Goal: Ability to maintain clinical measurements within normal limits will improve Outcome: Progressing Goal: Will remain free from infection Outcome: Progressing Goal: Diagnostic test results will improve Outcome: Progressing Goal: Respiratory complications will improve Outcome: Progressing Goal: Cardiovascular complication will be avoided Outcome: Progressing   Problem: Activity: Goal: Risk for activity intolerance will decrease Outcome: Progressing   Problem: Coping: Goal: Level of anxiety will decrease Outcome: Progressing   Problem: Safety: Goal: Ability to remain free from injury will improve Outcome: Progressing   Problem: Skin Integrity: Goal: Risk for impaired skin integrity will decrease Outcome: Progressing   Problem: Education: Goal: Ability to describe self-care measures that may prevent or decrease complications (Diabetes Survival Skills Education) will improve Outcome: Progressing Goal: Individualized Educational Video(s) Outcome: Progressing   Problem: Coping: Goal: Ability to adjust to condition or change in health will improve Outcome: Progressing   Problem: Fluid Volume: Goal: Ability to maintain a balanced intake and output will improve Outcome: Progressing   Problem: Health Behavior/Discharge Planning: Goal: Ability to identify and utilize available resources and services will improve Outcome: Progressing Goal: Ability to manage health-related needs will improve Outcome: Progressing   Problem: Metabolic: Goal: Ability to maintain appropriate glucose levels will  improve Outcome: Progressing   Problem: Nutritional: Goal: Progress toward achieving an optimal weight will improve Outcome: Progressing   Problem: Skin Integrity: Goal: Risk for impaired skin integrity will decrease Outcome: Progressing   Problem: Tissue Perfusion: Goal: Adequacy of tissue perfusion will improve Outcome: Progressing

## 2023-06-05 NOTE — Progress Notes (Signed)
 Rounding Note    Patient Name: Mary Mccarthy Date of Encounter: 06/05/2023  [facility] Cardiologist: Randene Bustard, MD   Subjective   Breathing improving   Inpatient Medications    Scheduled Meds:  amLODipine   5 mg Oral Daily   atorvastatin   40 mg Oral Daily   enoxaparin  (LOVENOX ) injection  30 mg Subcutaneous Q24H   fesoterodine   4 mg Oral Daily   fluticasone   2 spray Each Nare BID   hydrALAZINE   25 mg Oral Q8H   insulin  aspart  0-15 Units Subcutaneous TID WC   insulin  aspart  6 Units Subcutaneous TID WC   insulin  glargine-yfgn  35 Units Subcutaneous Daily   isosorbide  mononitrate  60 mg Oral Daily   lactulose   20 g Oral BID   levothyroxine   25 mcg Oral Daily   metoprolol  succinate  12.5 mg Oral Daily   mirtazapine   15 mg Oral QHS   pantoprazole   40 mg Oral BID   predniSONE   40 mg Oral Q breakfast   sodium chloride  flush  3 mL Intravenous Q12H   Continuous Infusions:  PRN Meds: acetaminophen , albuterol , ALPRAZolam , guaiFENesin -dextromethorphan , sodium chloride  flush, trimethobenzamide    Vital Signs    Vitals:   06/04/23 1929 06/05/23 0024 06/05/23 0433 06/05/23 0720  BP: (!) 138/49 (!) 167/47 (!) 145/45   Pulse: 60 (!) 59 62   Resp: (!) 21 18 19    Temp: 98.3 F (36.8 C) 97.7 F (36.5 C) 97.7 F (36.5 C)   TempSrc: Oral Oral Oral Oral  SpO2: 93% 96% 91%   Weight:   81 kg   Height:        Intake/Output Summary (Last 24 hours) at 06/05/2023 0843 Last data filed at 06/05/2023 0435 Gross per 24 hour  Intake 600 ml  Output 2000 ml  Net -1400 ml      06/05/2023    4:33 AM 06/04/2023    5:40 AM 06/03/2023    4:48 AM  Last 3 Weights  Weight (lbs) 178 lb 9.2 oz 180 lb 1.9 oz 181 lb 1.6 oz  Weight (kg) 81 kg 81.7 kg 82.146 kg      Telemetry    Sinus to sinus bradycardia in the 50-60s - Personally Reviewed  ECG    No new tracings - Personally Reviewed  Physical Exam   Chronically ill female Diffuse bilateral crackles SEM  through TAVR valve Abdomen benign Trace edema  Labs    High Sensitivity Troponin:   Recent Labs  Lab 05/14/23 0922 05/14/23 1340 05/27/23 0404  TROPONINIHS 35* 31* 16     Chemistry Recent Labs  Lab 05/30/23 0315 05/31/23 0256 06/03/23 0235 06/04/23 0307 06/05/23 0253  NA 134*   < > 134* 135 137  K 4.2   < > 4.4 4.6 4.2  CL 95*   < > 99 101 102  CO2 30   < > 26 26 26   GLUCOSE 112*   < > 146* 123* 158*  BUN 73*   < > 57* 50* 49*  CREATININE 2.11*   < > 1.57* 1.75* 1.62*  CALCIUM  8.8*   < > 8.6* 8.5* 8.3*  MG 2.5*  --   --   --  2.1  PROT 6.1*  --   --   --   --   ALBUMIN 2.1*  --   --   --   --   AST 97*  --   --   --   --  ALT 86*  --   --   --   --   ALKPHOS 66  --   --   --   --   BILITOT 0.9  --   --   --   --   GFRNONAA 23*   < > 32* 28* 31*  ANIONGAP 9   < > 9 8 9    < > = values in this interval not displayed.    Lipids No results for input(s): "CHOL", "TRIG", "HDL", "LABVLDL", "LDLCALC", "CHOLHDL" in the last 168 hours.  Hematology Recent Labs  Lab 06/01/23 0303 06/02/23 0316 06/05/23 0253  WBC 9.0 8.7 7.7  RBC 2.70* 2.69* 2.91*  HGB 7.9* 7.8* 8.8*  HCT 25.2* 25.1* 27.2*  MCV 93.3 93.3 93.5  MCH 29.3 29.0 30.2  MCHC 31.3 31.1 32.4  RDW 17.0* 17.4* 17.9*  PLT 201 225 252   Thyroid  No results for input(s): "TSH", "FREET4" in the last 168 hours.  BNP Recent Labs  Lab 06/03/23 1217  BNP 322.8*    DDimer No results for input(s): "DDIMER" in the last 168 hours.   Radiology    DG Chest 2 View Result Date: 06/03/2023 CLINICAL DATA:  Hypoxia EXAM: CHEST - 2 VIEW COMPARISON:  05/27/2023 FINDINGS: Cardiac shadow is enlarged but stable. Changes of prior TAVR are seen. Persistent airspace opacities are again identified relatively stable when compared with the prior exam. No new focal abnormality is seen. IMPRESSION: Diffuse bilateral multifocal pneumonia. Electronically Signed   By: Violeta Grey M.D.   On: 06/03/2023 11:14    Cardiac Studies   Echo  05/15/23:  1. Left ventricular ejection fraction, by estimation, is 60 to 65%. The  left ventricle has normal function. The left ventricle has no regional  wall motion abnormalities. There is severe asymmetric left ventricular  hypertrophy of the septal segment. Left   ventricular diastolic function could not be evaluated.   2. Right ventricular systolic function is normal. The right ventricular  size is mildly enlarged. There is moderately elevated pulmonary artery  systolic pressure. The estimated right ventricular systolic pressure is  53.7 mmHg.   3. Left atrial size was mildly dilated.   4. Right atrial size was mildly dilated.   5. The mitral valve is degenerative. Trivial mitral valve regurgitation.  Mild mitral stenosis. The mean mitral valve gradient is 3.0 mmHg. Severe  mitral annular calcification.   6. Tricuspid valve regurgitation is mild to moderate.   7. Mild paravalvular leak adjacent to the intervalvular fibrosa. The  aortic valve has been repaired/replaced. Aortic valve regurgitation is  mild. No aortic stenosis is present. There is a 26 mm [employer] stented  (TAVR) valve present in the aortic  position. Procedure Date: 10/26/2020. Echo findings are consistent with  normal structure and function of the aortic valve prosthesis. Echo  findings are consistent with perivalvular leak of the aortic prosthesis.  Aortic regurgitation PHT measures 514 msec.   Aortic valve mean gradient measures 9.0 mmHg. Aortic valve Vmax measures  1.88 m/s.   8. The inferior vena cava is dilated in size with <50% respiratory  variability, suggesting right atrial pressure of 15 mmHg.   Patient Profile     83 y.o. female with persistent atrial fibrillation, HFpEF, aortic stenosis status post TAVR, diabetes, obesity who was admitted on 05/14/2023 for acute on chronic diastolic heart failure.   Assessment & Plan    Acute on chronic diastolic heart failure CKD -Cr improved off lasix  1.75 - hold  lasix  net negative I/O without    Persistent atrial fibrillation - remains in sinus rhythm - amiodarone  discontinued due to concern for amiodarone  lung toxicity - no anticoagulation, pt declines   Pneumonitis Acute hypoxic respiratory failure - suspect related to amiodarone , which has been discontinued - question PNA/aspiration PNA - PNA felt less likely in the setting of high suspicion for amiodarone  toxicity - remains on prednisone  40 mg daily - continue oxygen  - update CXR per primary service    Hypertension - hydralazine  25 mg TID, 60 mg imdur , 12.5 mg toprol  daily, 2.5 mg amlodipine         For questions or updates, please contact [facility] Please consult [email or web address] for contact info under        Signed, Janelle Mediate, MD  06/05/2023, 8:43 AM

## 2023-06-05 NOTE — Progress Notes (Addendum)
 PROGRESS NOTE    Mary Mccarthy  ZOX:[id number] DOB: 1941/01/04 DOA: 05/14/2023 PCP: Roslyn Coombe, MD  83 yo female with the past medical history of hypertension, hyperlipidemia, paroxysmal atrial fibrillation, aortic stenosis sp TAVR, T2DM and obesity who presented with dyspnea and lower extremity edema.  Found to have acute on chronic HFpEF with worsening hypoxia secondary to likely amiodarone  toxicity.  - 4/21 pulmonary consulted, amiodarone  discontinued and prednisone  taper over 2 to 4 weeks recommended - Complicated by ongoing issues with hypoxemia despite diuresis  Subjective: - Breathing a little better today, diuresed more yesterday, on 8 L O2 this morning but sats are 98, 99%  Assessment and Plan:  Acute hypoxic respiratory failure -Multifactorial in the setting of chronic diastolic CHF, amiodarone  toxicity also suspected -On 2 L O2 at baseline, with significant hypoxia noted with minimal exertion -Encouraged incentive spirometry, flutter valve, diuresis DuoNebs, remains on prednisone  -Repeat x-ray unchanged, persistent bilateral airspace opacities -Concerned about extremely limited pulmonary reserve and persistence of significant hypoxemia, pulmonary reconsulted yesterday, recommended to continue current steroids and additional diuresis, will also consult palliative care   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  Evaluated by pulmonology 4/21, concern for amiodarone  toxicity.  Discontinued amiodarone , initiated corticosteroids.  Transitioned from IV Solu-Medrol  to prednisone  40 mg daily.   -Continues to have issues with ongoing hypoxia - See discussion above   Acute on chronic HFpEF - Diuresed with IV Lasix , 9.3 L negative, volume status improving - Creatinine stabilizing, GDMT limited by AKI/CKD  - See discussion above, received IV Lasix  X3 doses yesterday and 2  additional doses today  Acute kidney injury on CKD 3B - Baseline creatinine around 1.5, creatinine peaked at 3, now improving, monitor  Diarrhea -Discontinued Ensure, improving, monitor   Persistent atrial fibrillation -Amiodarone  discontinued. -She has declined anticoagulation in the past  Diabetes mellitus - Increase glargine, meal coverage   CAD/hypertension - Continue metoprolol , Imdur , hydralazine , amlodipine .  Statin on board.  Not on aspirin .   Aortic stenosis s/p TAVR - Stable.   Physical debilitation muscle weakness - Exacerbated by hypoxia and deconditioning.  Evaluated by PT/OT.  -Patient declines SNF    DVT prophylaxis: Lovenox  Code Status: DNR Family Communication: None present Disposition Plan: Plan for home with?  Palliative care services soon  Consultants: Cards, pulmonary   Procedures:   Antimicrobials:    Objective: Vitals:   06/05/23 0024 06/05/23 0433 06/05/23 0720 06/05/23 1130  BP: (!) 167/47 (!) 145/45 (!) 158/37 (!) 120/41  Pulse: (!) 59 62 (!) 56 (!) 58  Resp: 18 19 16 17   Temp: 97.7 F (36.5 C) 97.7 F (36.5 C) (!) 97.2 F (36.2 C) 97.7 F (36.5 C)  TempSrc: Oral Oral Oral Oral  SpO2: 96% 91% 100% 99%  Weight:  81 kg    Height:        Intake/Output Summary (Last 24 hours) at 06/05/2023 1139 Last data filed at 06/05/2023 0900 Gross per 24 hour  Intake 720 ml  Output 2000 ml  Net -1280 ml   Filed Weights   06/03/23 0448 06/04/23 0540 06/05/23 0433  Weight: 82.1 kg 81.7 kg 81 kg    Examination:  General exam: Elderly frail chronically ill female laying in bed, AO x 3 HEENT: No JVD CVS: S1-S2, regular rhythm Lungs: Few scattered rhonchi, rare Rales Abdomen: Soft, nontender, bowel sounds present  Extremities: Trace edema Skin: No rashes Psychiatry:  Mood & affect appropriate.  Data Reviewed:   CBC: Recent Labs  Lab 05/30/23 0315 05/31/23 0256 06/01/23 0303 06/02/23 0316 06/05/23 0253  WBC 13.0* 9.9 9.0  8.7 7.7  HGB 8.4* 7.9* 7.9* 7.8* 8.8*  HCT 26.5* 24.8* 25.2* 25.1* 27.2*  MCV 92.3 92.5 93.3 93.3 93.5  PLT 219 179 201 225 252   Basic Metabolic Panel: Recent Labs  Lab 05/30/23 0315 05/31/23 0256 06/01/23 0303 06/02/23 0316 06/03/23 0235 06/04/23 0307 06/05/23 0253  NA 134*   < > 134* 135 134* 135 137  K 4.2   < > 4.6 4.9 4.4 4.6 4.2  CL 95*   < > 97* 99 99 101 102  CO2 30   < > 31 30 26 26 26   GLUCOSE 112*   < > 210* 125* 146* 123* 158*  BUN 73*   < > 68* 58* 57* 50* 49*  CREATININE 2.11*   < > 1.91* 1.92* 1.57* 1.75* 1.62*  CALCIUM  8.8*   < > 8.4* 8.4* 8.6* 8.5* 8.3*  MG 2.5*  --   --   --   --   --  2.1  PHOS  --   --   --   --   --   --  2.5   < > = values in this interval not displayed.   GFR: Estimated Creatinine Clearance: 24.9 mL/min (A) (by C-G formula based on SCr of 1.62 mg/dL (H)). Liver Function Tests: Recent Labs  Lab 05/30/23 0315  AST 97*  ALT 86*  ALKPHOS 66  BILITOT 0.9  PROT 6.1*  ALBUMIN 2.1*   No results for input(s): "LIPASE", "AMYLASE" in the last 168 hours. No results for input(s): "AMMONIA" in the last 168 hours. Coagulation Profile: No results for input(s): "INR", "PROTIME" in the last 168 hours. Cardiac Enzymes: No results for input(s): "CKTOTAL", "CKMB", "CKMBINDEX", "TROPONINI" in the last 168 hours. BNP (last 3 results) Recent Labs    03/14/23 0924  PROBNP 235.0*   HbA1C: No results for input(s): "HGBA1C" in the last 72 hours. CBG: Recent Labs  Lab 06/04/23 1136 06/04/23 1607 06/04/23 2101 06/05/23 0617 06/05/23 1125  GLUCAP 127* 173* 219* 155* 159*   Lipid Profile: No results for input(s): "CHOL", "HDL", "LDLCALC", "TRIG", "CHOLHDL", "LDLDIRECT" in the last 72 hours. Thyroid  Function Tests: No results for input(s): "TSH", "T4TOTAL", "FREET4", "T3FREE", "THYROIDAB" in the last 72 hours. Anemia Panel: No results for input(s): "VITAMINB12", "FOLATE", "FERRITIN", "TIBC", "IRON ", "RETICCTPCT" in the last 72  hours. Urine analysis:    Component Value Date/Time   COLORURINE YELLOW 05/14/2023 1148   APPEARANCEUR TURBID (A) 05/14/2023 1148   LABSPEC 1.014 05/14/2023 1148   PHURINE 5.0 05/14/2023 1148   GLUCOSEU NEGATIVE 05/14/2023 1148   GLUCOSEU NEGATIVE 03/15/2022 1217   HGBUR NEGATIVE 05/14/2023 1148   BILIRUBINUR NEGATIVE 05/14/2023 1148   KETONESUR NEGATIVE 05/14/2023 1148   PROTEINUR 30 (A) 05/14/2023 1148   UROBILINOGEN 0.2 03/15/2022 1217   NITRITE NEGATIVE 05/14/2023 1148   LEUKOCYTESUR NEGATIVE 05/14/2023 1148   Sepsis Labs: @LABRCNTIP (procalcitonin:4,lacticidven:4)  )No results found for this or any previous visit (from the past 240 hours).   Radiology Studies: No results found.    Scheduled Meds:  amLODipine   5 mg Oral Daily   atorvastatin   40 mg Oral Daily   enoxaparin  (LOVENOX ) injection  30 mg Subcutaneous Q24H   fesoterodine   4 mg Oral Daily   fluticasone   2 spray Each Nare BID   furosemide   40 mg Intravenous Q8H   hydrALAZINE   25  mg Oral Q8H   insulin  aspart  0-15 Units Subcutaneous TID WC   insulin  aspart  6 Units Subcutaneous TID WC   insulin  glargine-yfgn  35 Units Subcutaneous Daily   isosorbide  mononitrate  60 mg Oral Daily   lactulose   20 g Oral BID   levothyroxine   25 mcg Oral Daily   metoprolol  succinate  12.5 mg Oral Daily   mirtazapine   15 mg Oral QHS   pantoprazole   40 mg Oral BID   predniSONE   40 mg Oral Q breakfast   sodium chloride  flush  3 mL Intravenous Q12H   Continuous Infusions:   LOS: 22 days    Time spent:    Deforest Fast, MD [facility]   06/05/2023, 11:39 AM

## 2023-06-05 NOTE — Progress Notes (Signed)
 Occupational Therapy Treatment Patient Details Name: Mary Mccarthy MRN: [id number] DOB: April 19, 1940 Today's Date: 06/05/2023   History of present illness Patient is a 83 year old female with acute on chronic respiratory failure with hypoxia secondary to acute on chronic diastolic CHF. Pt found to have aspiration PNA. History of HTN, hyperlipidemia, respiratory failure on 2 L, PAF, GI bleed, aortic stenosis s/p TAVR,  transient heart block, diabetes mellitus type II, CAD, hypothyroidism, GERD   OT comments  Pt greeted on 9L when on [facility] and attempted to complete peri care but required total assist while standing. She then completed step transfer to chair with close CGA. She then post set up with increase in time and pacing completed UE bathing post set up in sitting and required moderate assist with LE bathing and was able to remain at 97%. Pt then was set down to 6L and then ambulated about 12 ft and went down to 86% and required about 5 mins of seated rest x2 trials. Nursing was made aware. Patient will benefit from continued inpatient follow up therapy, <3 hours/day.       If plan is discharge home, recommend the following:  Help with stairs or ramp for entrance;A little help with bathing/dressing/bathroom;A little help with walking and/or transfers;Assistance with cooking/housework;Assist for transportation   Equipment Recommendations  Wheelchair (measurements OT);Wheelchair cushion (measurements OT)    Recommendations for Other Services      Precautions / Restrictions Precautions Precautions: Fall Recall of Precautions/Restrictions: Intact Precaution/Restrictions Comments: watch SpO2 Restrictions Weight Bearing Restrictions Per Provider Order: No       Mobility Bed Mobility               General bed mobility comments: presented on BSC    Transfers Overall transfer level: Needs assistance Equipment used: Rolling walker (2 wheels) Transfers: Sit to/from  Stand Sit to Stand: Contact guard assist     Step pivot transfers: Contact guard assist, Supervision           Balance Overall balance assessment: Needs assistance Sitting-balance support: Feet supported, No upper extremity supported Sitting balance-[name]: Fair     Standing balance support: Bilateral upper extremity supported Standing balance-[name]: Fair Standing balance comment: Rliant on BUE support                           ADL either performed or assessed with clinical judgement   ADL Overall ADL's : Needs assistance/impaired Eating/Feeding: Set up;Sitting   Grooming: Wash/dry hands;Set up;Sitting   Upper Body Bathing: Set up;Sitting   Lower Body Bathing: Moderate assistance;Sitting/lateral leans;Sit to/from stand   Upper Body Dressing : Set up;Sitting   Lower Body Dressing: Moderate assistance;Sitting/lateral leans   Toilet Transfer: Contact guard assist;Cueing for sequencing;Cueing for safety   Toileting- Clothing Manipulation and Hygiene: Total assistance Toileting - Clothing Manipulation Details (indicate cue type and reason): able to complete for urination but post BM needs total assist     Functional mobility during ADLs: Contact guard assist;Rolling walker (2 wheels)      Extremity/Trunk Assessment Upper Extremity Assessment Upper Extremity Assessment: Overall WFL for tasks assessed   Lower Extremity Assessment Lower Extremity Assessment: Defer to PT evaluation        Vision   Vision Assessment?: No apparent visual deficits   Perception Perception Perception: Not tested   Praxis Praxis Praxis: Not tested   Communication Communication Communication: Impaired Factors Affecting Communication: Hearing impaired   Cognition Arousal: Alert  Behavior During Therapy: WFL for tasks assessed/performed Cognition: No apparent impairments                               Following commands: Intact        Cueing    Cueing Techniques: Verbal cues  Exercises      Shoulder Instructions       General Comments Pt greeted on 9L while on commode in session md set to 6L following peri care/sponge bath. Pt also left on 6L and nursing was made aware.    Pertinent Vitals/ Pain       Pain Assessment Pain Assessment: No/denies pain  Home Living                                          Prior Functioning/Environment              Frequency  Min 2X/week        Progress Toward Goals  OT Goals(current goals can now be found in the care plan section)  Progress towards OT goals: Progressing toward goals  Acute Rehab OT Goals Patient Stated Goal: to breath better OT Goal Formulation: With patient Time For Goal Achievement: 06/20/23 Potential to Achieve Goals: Good ADL Goals Pt Will Perform Grooming: with modified independence;standing Pt Will Perform Lower Body Dressing: with modified independence;sit to/from stand Pt Will Transfer to Toilet: with modified independence;regular height toilet;ambulating Pt/caregiver will Perform Home Exercise Program: Increased strength;Both right and left upper extremity;With Supervision  Plan      Co-evaluation                 AM-PAC OT "6 Clicks" Daily Activity     Outcome Measure   Help from another person eating meals?: None Help from another person taking care of personal grooming?: A Little Help from another person toileting, which includes using toliet, bedpan, or urinal?: Total Help from another person bathing (including washing, rinsing, drying)?: A Lot Help from another person to put on and taking off regular upper body clothing?: A Little Help from another person to put on and taking off regular lower body clothing?: A Lot 6 Click Score: 15    End of Session Equipment Utilized During Treatment: Rolling walker (2 wheels);Gait belt  OT Visit Diagnosis: Unsteadiness on feet (R26.81);Muscle weakness (generalized)  (M62.81)   Activity Tolerance Patient tolerated treatment well   Patient Left in chair;with call bell/phone within reach;with chair alarm set   Nurse Communication Mobility status        Time: [phone] OT Time Calculation (min): 63 min  Charges: OT General Charges $OT Visit: 1 Visit OT Treatments $Self Care/Home Management : 53-67 mins  Erving Heather OTR/L  Acute Rehab Services  [phone] office number   Stevphen Elders 06/05/2023, 9:33 AM

## 2023-06-06 DIAGNOSIS — I5033 Acute on chronic diastolic (congestive) heart failure: Secondary | ICD-10-CM | POA: Diagnosis not present

## 2023-06-06 DIAGNOSIS — I480 Paroxysmal atrial fibrillation: Secondary | ICD-10-CM | POA: Diagnosis not present

## 2023-06-06 DIAGNOSIS — Z7189 Other specified counseling: Secondary | ICD-10-CM | POA: Diagnosis not present

## 2023-06-06 DIAGNOSIS — T462X1A Poisoning by other antidysrhythmic drugs, accidental (unintentional), initial encounter: Secondary | ICD-10-CM | POA: Diagnosis not present

## 2023-06-06 DIAGNOSIS — Z952 Presence of prosthetic heart valve: Secondary | ICD-10-CM | POA: Diagnosis not present

## 2023-06-06 DIAGNOSIS — Z515 Encounter for palliative care: Secondary | ICD-10-CM | POA: Diagnosis not present

## 2023-06-06 DIAGNOSIS — J9620 Acute and chronic respiratory failure, unspecified whether with hypoxia or hypercapnia: Secondary | ICD-10-CM | POA: Diagnosis not present

## 2023-06-06 LAB — BASIC METABOLIC PANEL WITH GFR
Anion gap: 5 (ref 5–15)
BUN: 48 mg/dL — ABNORMAL HIGH (ref 8–23)
CO2: 29 mmol/L (ref 22–32)
Calcium: 8.2 mg/dL — ABNORMAL LOW (ref 8.9–10.3)
Chloride: 102 mmol/L (ref 98–111)
Creatinine, Ser: 1.86 mg/dL — ABNORMAL HIGH (ref 0.44–1.00)
GFR, Estimated: 26 mL/min — ABNORMAL LOW (ref >60)
Glucose, Bld: 148 mg/dL — ABNORMAL HIGH (ref 70–99)
Potassium: 4.7 mmol/L (ref 3.5–5.1)
Sodium: 136 mmol/L (ref 135–145)

## 2023-06-06 LAB — GLUCOSE, CAPILLARY
Glucose-Capillary: 126 mg/dL — ABNORMAL HIGH (ref 70–99)
Glucose-Capillary: 107 mg/dL — ABNORMAL HIGH (ref 70–99)
Glucose-Capillary: 196 mg/dL — ABNORMAL HIGH (ref 70–99)
Glucose-Capillary: 172 mg/dL — ABNORMAL HIGH (ref 70–99)
Glucose-Capillary: 215 mg/dL — ABNORMAL HIGH (ref 70–99)

## 2023-06-06 LAB — MAGNESIUM: Magnesium: 2 mg/dL (ref 1.7–2.4)

## 2023-06-06 MED ORDER — ACETAZOLAMIDE SODIUM 500 MG IJ SOLR
500.0000 mg | Freq: Once | INTRAMUSCULAR | Status: AC
  Administered 2023-06-06: 500 mg via INTRAVENOUS
  Filled 2023-06-06: qty 500

## 2023-06-06 MED ORDER — FUROSEMIDE 10 MG/ML IJ SOLN: 40.0000 mg | Freq: Three times a day (TID) | INTRAMUSCULAR | Status: DC

## 2023-06-06 MED ORDER — FUROSEMIDE 10 MG/ML IJ SOLN
40.0000 mg | Freq: Three times a day (TID) | INTRAMUSCULAR | Status: AC
  Administered 2023-06-06 (×2): 40 mg via INTRAVENOUS
  Filled 2023-06-06 (×2): qty 4

## 2023-06-06 NOTE — Plan of Care (Signed)
 Pt vital signs and labs trending towards normal limits

## 2023-06-06 NOTE — Progress Notes (Signed)
 PROGRESS NOTE    Mary Mccarthy  VWU:[id number] DOB: 1940-03-27 DOA: 05/14/2023 PCP: Roslyn Coombe, MD  83 yo female with the past medical history of hypertension, hyperlipidemia, paroxysmal atrial fibrillation, aortic stenosis sp TAVR, T2DM and obesity who presented with dyspnea and lower extremity edema.  Found to have acute on chronic HFpEF with worsening hypoxia secondary to likely amiodarone  toxicity.  - 4/21 pulmonary consulted, amiodarone  discontinued and prednisone  taper over 2 to 4 weeks recommended - Complicated by ongoing issues with hypoxemia despite diuresis  Subjective: - Breathing a little better today, diuresed more yesterday, on 8 L O2 this morning but sats are 98, 99%  Assessment and Plan:  Acute hypoxic respiratory failure -Multifactorial in the setting of chronic diastolic CHF, amiodarone  toxicity also suspected -On 2 L O2 at baseline, with significant hypoxia noted with minimal exertion -Encouraged incentive spirometry, flutter valve, diuresis DuoNebs, remains on prednisone  -Repeat x-ray unchanged, persistent bilateral airspace opacities -Concerned about extremely limited pulmonary reserve and persistence of significant hypoxemia, pulmonary reconsulted 4/28, recommended to continue current steroids and additional diuresis,  -Palliative consult today -Finally some improvement in the last 48 hours, down to 5L O2 at rest, assess ambulation with PT -Anticipate DC later today or tomorrow  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  Evaluated by pulmonology 4/21, concern for amiodarone  toxicity.  Discontinued amiodarone , initiated corticosteroids.  Transitioned from IV Solu-Medrol  to prednisone  40 mg daily.  >  Recommended to continue same dose for 6 weeks and then follow-up with pulmonary for further taper -Continues to have issues with ongoing hypoxia - See  discussion above   Acute on chronic HFpEF - Diuresed with IV Lasix , 9.3 L negative, volume status improving - Creatinine stabilizing, GDMT limited by AKI/CKD  - See discussion above, diuresed better in the last 48 hours, discussed with pulmonary today, still appears mildly overloaded, repeat Lasix  today - Discharge planning, home later today or tomorrow - Albumin is 2.1, switch to torsemide  at discharge  Acute kidney injury on CKD 3B - Baseline creatinine around 1.5, creatinine peaked at 3, now improving, monitor  Diarrhea -Discontinued Ensure, improving, monitor   Persistent atrial fibrillation -Amiodarone  discontinued. -She has declined anticoagulation in the past  Diabetes mellitus - Increase glargine, meal coverage   CAD/hypertension - Continue metoprolol , Imdur , hydralazine , amlodipine .  Statin on board.  Not on aspirin .   Aortic stenosis s/p TAVR - Stable.   Physical debilitation muscle weakness - Exacerbated by hypoxia and deconditioning.  Evaluated by PT/OT.  -Patient declines SNF    DVT prophylaxis: Lovenox  Code Status: DNR Family Communication: None present Disposition Plan: Plan for home with?  Palliative care services soon  Consultants: Cards, pulmonary   Procedures:   Antimicrobials:    Objective: Vitals:   06/05/23 1934 06/06/23 0019 06/06/23 0455 06/06/23 0808  BP: (!) 142/48 (!) 151/52 (!) 124/93 (!) 150/43  Pulse: (!) 57 (!) 56 (!) 53 (!) 59  Resp: 18 20 20 16   Temp: 97.7 F (36.5 C) 97.6 F (36.4 C) 97.6 F (36.4 C) 98.3 F (36.8 C)  TempSrc: Oral Oral Oral Oral  SpO2: 100% 97% 99% 97%  Weight:   79.7 kg   Height:        Intake/Output Summary (Last 24 hours) at 06/06/2023 0946 Last data filed at 06/06/2023 0808 Gross per 24 hour  Intake 720 ml  Output 1400 ml  Net -680 ml   Filed Weights   06/04/23  0540 06/05/23 0433 06/06/23 0455  Weight: 81.7 kg 81 kg 79.7 kg    Examination:  General exam: Elderly frail chronically ill  female laying in bed, AO x 3 HEENT: No JVD CVS: S1-S2, regular rhythm Lungs: Few scattered rhonchi, rare Rales Abdomen: Soft, nontender, bowel sounds present  Extremities: Trace edema Skin: No rashes Psychiatry:  Mood & affect appropriate.     Data Reviewed:   CBC: Recent Labs  Lab 05/31/23 0256 06/01/23 0303 06/02/23 0316 06/05/23 0253  WBC 9.9 9.0 8.7 7.7  HGB 7.9* 7.9* 7.8* 8.8*  HCT 24.8* 25.2* 25.1* 27.2*  MCV 92.5 93.3 93.3 93.5  PLT 179 201 225 252   Basic Metabolic Panel: Recent Labs  Lab 06/02/23 0316 06/03/23 0235 06/04/23 0307 06/05/23 0253 06/06/23 0248  NA 135 134* 135 137 136  K 4.9 4.4 4.6 4.2 4.7  CL 99 99 101 102 102  CO2 30 26 26 26 29   GLUCOSE 125* 146* 123* 158* 148*  BUN 58* 57* 50* 49* 48*  CREATININE 1.92* 1.57* 1.75* 1.62* 1.86*  CALCIUM  8.4* 8.6* 8.5* 8.3* 8.2*  MG  --   --   --  2.1 2.0  PHOS  --   --   --  2.5  --    GFR: Estimated Creatinine Clearance: 21.5 mL/min (A) (by C-G formula based on SCr of 1.86 mg/dL (H)). Liver Function Tests: No results for input(s): "AST", "ALT", "ALKPHOS", "BILITOT", "PROT", "ALBUMIN" in the last 168 hours.  No results for input(s): "LIPASE", "AMYLASE" in the last 168 hours. No results for input(s): "AMMONIA" in the last 168 hours. Coagulation Profile: No results for input(s): "INR", "PROTIME" in the last 168 hours. Cardiac Enzymes: No results for input(s): "CKTOTAL", "CKMB", "CKMBINDEX", "TROPONINI" in the last 168 hours. BNP (last 3 results) Recent Labs    03/14/23 0924  PROBNP 235.0*   HbA1C: No results for input(s): "HGBA1C" in the last 72 hours. CBG: Recent Labs  Lab 06/05/23 0617 06/05/23 1125 06/05/23 1545 06/05/23 2115 06/06/23 0610  GLUCAP 155* 159* 169* 168* 126*   Lipid Profile: No results for input(s): "CHOL", "HDL", "LDLCALC", "TRIG", "CHOLHDL", "LDLDIRECT" in the last 72 hours. Thyroid  Function Tests: No results for input(s): "TSH", "T4TOTAL", "FREET4", "T3FREE",  "THYROIDAB" in the last 72 hours. Anemia Panel: No results for input(s): "VITAMINB12", "FOLATE", "FERRITIN", "TIBC", "IRON ", "RETICCTPCT" in the last 72 hours. Urine analysis:    Component Value Date/Time   COLORURINE YELLOW 05/14/2023 1148   APPEARANCEUR TURBID (A) 05/14/2023 1148   LABSPEC 1.014 05/14/2023 1148   PHURINE 5.0 05/14/2023 1148   GLUCOSEU NEGATIVE 05/14/2023 1148   GLUCOSEU NEGATIVE 03/15/2022 1217   HGBUR NEGATIVE 05/14/2023 1148   BILIRUBINUR NEGATIVE 05/14/2023 1148   KETONESUR NEGATIVE 05/14/2023 1148   PROTEINUR 30 (A) 05/14/2023 1148   UROBILINOGEN 0.2 03/15/2022 1217   NITRITE NEGATIVE 05/14/2023 1148   LEUKOCYTESUR NEGATIVE 05/14/2023 1148   Sepsis Labs: @LABRCNTIP (procalcitonin:4,lacticidven:4)  )No results found for this or any previous visit (from the past 240 hours).   Radiology Studies: No results found.    Scheduled Meds:  amLODipine   5 mg Oral Daily   atorvastatin   40 mg Oral Daily   enoxaparin  (LOVENOX ) injection  30 mg Subcutaneous Q24H   fesoterodine   4 mg Oral Daily   fluticasone   2 spray Each Nare BID   furosemide   40 mg Intravenous Q8H   hydrALAZINE   50 mg Oral Q8H   insulin  aspart  0-15 Units Subcutaneous TID WC   insulin  aspart  6 Units Subcutaneous TID WC   insulin  glargine-yfgn  35 Units Subcutaneous Daily   isosorbide  mononitrate  60 mg Oral Daily   lactulose   20 g Oral BID   levothyroxine   25 mcg Oral Daily   metoprolol  succinate  12.5 mg Oral Daily   mirtazapine   15 mg Oral QHS   pantoprazole   40 mg Oral BID   predniSONE   40 mg Oral Q breakfast   sodium chloride  flush  3 mL Intravenous Q12H   Continuous Infusions:   LOS: 23 days    Time spent:    Deforest Fast, MD [facility]   06/06/2023, 9:46 AM

## 2023-06-06 NOTE — Progress Notes (Signed)
 Heart Failure Stewardship Pharmacist Progress Note   PCP: Roslyn Coombe, MD PCP-Cardiologist: Randene Bustard, MD    HPI:  83 yo F with PMH of HTN, HLD, T2DM, CKD3, CAD, paroxysmal afib, recurrent GI bleed, severe aortic stenosis s/p TAVR complicated with transient heart block, respiratory failure on 2L [place] at BL, hypothyroidism, GERD   Patient previously admitted 02/2023 for CHF exacerbation. Patient was diuresed during admission and home lasix  was increased from prn to daily.   Presented to the ED on 4/7 with worsening SOB with exertion and leg swelling over the last 2 weeks. Also presented with productive cough, chest pain/tightness, NVD, and wheezing. Reports PCP instructed her to take an additional dose of 20mg  lasix  in the setting of weight gain of 3 lb - no improvement per pt. Endorses right flank pain 2/2 coughing.   In the ED, patient was hypertensive and hypoxic (did not present on her home O2) wheezing, rhonchi, rales and BL LE edema present on exam. Slight murmur present. JVD Patient diuresed with lasix  40mg  x2 in the ED. CXR showed mild to moderate pulmonary vascular congestion. No dense consolidation or lung collapse. ECHO 4/8 showed LVEF 60-65% (unchanged from prior 11/2022) with normal LV function. Severe asymmetric LV hypertrophy of septal segment. LV diastolic function could not be evaluated. Normal RV function. Mildly enlarged LV. MV is degenerative with trivial regurgitation. Mild stenosis and severe annular calcifications also present. Mild to moderate tricuspid valve regurgitation. Cardiology was consulted, planning with diuresis for now. Repeat CXR 4/17 showing mild decrease in patchy multifocal airspace a nodular airspace. Opacities and cardiomegaly. Repeat CXR on 4/21 with coarse and asymmetric bilateral opacity greater on the left not significantly changed from 4/17, favor bilateral infection rather than edema. CT chest on 4/20 with diffuse multifocal patchy airspace and  ground glass opacities throughout both lungs. Findings most consistent with multifocal pneumonia. PCCM consulted and believe this is most consistent with amiodarone  toxicity - she is now improving off amiodarone  and on steroids. Repeat CXR 4/27 showed persistent airspaces opacitiies, stable from prior exam. Diffuse BL multifocal PNA.   States she is feeling better than yesterday. Breathing is good. Still on 8L [place] but plans to wean today. Still with trace edema on R leg and 1+ edema on left leg.   Current HF Medications: Beta Blocker: metoprolol  succinate 12.5 mg daily  Other: isosorbide  mononitrate 60 mg daily, hydralazine  50 mg q8h  Prior to admission HF Medications: Diuretic: Lasix  60mg  daily Beta blocker: Metoprolol  succinate 12.5mg  daily  Other: Isosorbide  mononitrate 15mg  daily, hydralazine  25mg  q8h  Pertinent Lab Values: Serum creatinine 1.62>1.86 (BL 1.3-1.5), BUN 48, Potassium 4.7, Sodium 136, BNP 977.8>322.8, Magnesium  2.0, A1c 6.5   Vital Signs: Weight: 175 lbs (admission weight: 191 lbs) - standing weights Blood pressure: 120-150/40s Heart rate: 50-60s I/O: net -0.5L yesterday; net -18L since admission 8L [place]   Medication Assistance / Insurance Benefits Check: Does the patient have prescription insurance?  Yes Type of insurance plan: Medicare  Outpatient Pharmacy:  Prior to admission outpatient pharmacy: [facility]  Is the patient willing to use [facility] pharmacy at discharge? Yes Is the patient willing to transition their outpatient pharmacy to utilize a [facility] outpatient pharmacy? No    Assessment: 1. Acute on chronic diastolic CHF (LVEF 60-65%). NYHA class II symptoms. Strict I/Os and daily weights. Keep K>4 and Mg>2. Overall oxygen  status not at baseline - amiodarone  toxicity per PCCM. Concern for multifocal PNA and planning f/u with pulm clinic  8 weeks post discharge for steroid taper and O2 eval.  - Now off IV lasix . Creatinine bumped today 1.62>1.86. -  Continue hydralazine  50 mg q8h and Imdur  60 mg daily - Agree with deferring SGLT2i given hx of 3 UTI in prior 1-2 months  - Continue metoprolol  succinate 12.5 mg daily - HR stable 50-60s.   Plan: 1) Medication changes recommended at this time: - Restart IV lasix    2) Patient assistance: - Contacted son and he is interested in utilizing [facility] for medication changes upon discharge  but would [facility] and remaining prescriptions  - Son reported that current medications are affordable  3)  Education  - Patient has been educated on current HF medications and potential additions to HF medication regimen - Patient verbalizes understanding that over the next few months, these medication doses may change and more medications may be added to optimize HF regimen - Patient has been educated on basic disease state pathophysiology and goals of therapy  Jerilyn Monte, PharmD, BCPS Heart Failure Stewardship Pharmacist Phone [phone]

## 2023-06-06 NOTE — Progress Notes (Signed)
 Physical Therapy Treatment Patient Details Name: Mary Mccarthy MRN: [id number] DOB: 1940-11-10 Today's Date: 06/06/2023   History of Present Illness Patient is a 83 year old female with acute on chronic respiratory failure with hypoxia secondary to acute on chronic diastolic CHF. Pt found to have aspiration PNA. History of HTN, hyperlipidemia, respiratory failure on 2 L, PAF, GI bleed, aortic stenosis s/p TAVR,  transient heart block, diabetes mellitus type II, CAD, hypothyroidism, GERD    PT Comments  Pt greeted supine in bed, pleasant and agreeable to PT session. She completed bed mobility and transfers with supervision for safety and increased time. Pt engaged in multiple bouts of short-distance gait. She ambulate ~20-37ft four times using rollator with CGA and chair follow. Pt continues to desaturate with functional mobility; however, required lower supplemental oxygen  compared to prior sessions. She mobilized on 6L O2, desaturated between 72-82% SpO2 and required seated rest and bump up to 8L in order to recover within ~1-73mins. Will continue to follow acutely and advance appropriately.     If plan is discharge home, recommend the following: A lot of help with walking and/or transfers;A lot of help with bathing/dressing/bathroom;Assistance with cooking/housework;Assist for transportation;Help with stairs or ramp for entrance   Can travel by private vehicle     Yes  Equipment Recommendations  Wheelchair (measurements PT);Wheelchair cushion (measurements PT)    Recommendations for Other Services       Precautions / Restrictions Precautions Precautions: Fall Recall of Precautions/Restrictions: Intact Precaution/Restrictions Comments: watch SpO2 Restrictions Weight Bearing Restrictions Per Provider Order: No     Mobility  Bed Mobility Overal bed mobility: Needs Assistance Bed Mobility: Supine to Sit     Supine to sit: Supervision, HOB elevated, Used rails      General bed mobility comments: Pt sat up on L side of bed with increased time. Cued pt to lean to the opposite side, unweight hip, and then advance hip in order to scoot fwd easier.    Transfers Overall transfer level: Needs assistance Equipment used: Rollator (4 wheels) Transfers: Sit to/from Stand Sit to Stand: Supervision           General transfer comment: Pt stood from lowest bed height and recliner chair with proper sequencing on rollator breaks and hand positioning. She required no physical assistance to power up. Increased time to reach upright position and supervision for safety.    Ambulation/Gait Ambulation/Gait assistance: Contact guard assist, +2 safety/equipment (Chair Follow) Gait Distance (Feet): 30 Feet (1x20, seated rest, 1x30, seated rest, 1x20, seated rest, 1x20, standing rest, 1x10, seated rest) Assistive device: Rollator (4 wheels) Gait Pattern/deviations: Decreased step length - right, Decreased step length - left, Step-to pattern, Trunk flexed, Wide base of support Gait velocity: decreased Gait velocity interpretation: <1.31 ft/sec, indicative of household ambulator   General Gait Details: Pt ambulated with short slow step and fwd flex posture. VC to adjust posture and bring body closer to rollator. Pt maintianed LLE in ER and RLE in neutral position. She had difficulty advancing BLE and experienced DOE/SOB. Cued PLB technique throughout.   Stairs             Wheelchair Mobility     Tilt Bed    Modified Rankin (Stroke Patients Only)       Balance Overall balance assessment: Needs assistance Sitting-balance support: Feet supported, No upper extremity supported Sitting balance-[name]: Fair     Standing balance support: Bilateral upper extremity supported Standing balance-[name]: Poor Standing balance comment: Pt dependent  on rollator during gait                            Communication Communication Communication:  Impaired Factors Affecting Communication: Hearing impaired  Cognition Arousal: Alert Behavior During Therapy: WFL for tasks assessed/performed   PT - Cognitive impairments: No apparent impairments                         Following commands: Intact      Cueing Cueing Techniques: Verbal cues  Exercises      General Comments General comments (skin integrity, edema, etc.): Pt greeted on 4L O2 at rest with SpO2 98%. Increased pt to 6L prior to initiating mobility. She desaturated during each bout of ambulation with SpO2 ranging from 72-82%. She required seated rest and bump up to 8L to recover SpO2 >88% within ~79min. As pt fatigued, the drop is SpO2 would happen quicker and be more significant as well as the pt requiring a longer rest to recover. Returned pt to room and set her up in recliner chair. Titrated back to 4L with SpO2 >93%. RN and MD aware.      Pertinent Vitals/Pain Pain Assessment Pain Assessment: No/denies pain    Home Living                          Prior Function            PT Goals (current goals can now be found in the care plan section) Acute Rehab PT Goals Patient Stated Goal: Get stronger and return Home Progress towards PT goals: Progressing toward goals    Frequency    Min 2X/week      PT Plan      Co-evaluation              AM-PAC PT "6 Clicks" Mobility   Outcome Measure  Help needed turning from your back to your side while in a flat bed without using bedrails?: A Little Help needed moving from lying on your back to sitting on the side of a flat bed without using bedrails?: A Little Help needed moving to and from a bed to a chair (including a wheelchair)?: A Little Help needed standing up from a chair using your arms (e.g., wheelchair or bedside chair)?: A Little Help needed to walk in hospital room?: Total Help needed climbing 3-5 steps with a railing? : Total 6 Click Score: 14    End of Session Equipment  Utilized During Treatment: Gait belt;Oxygen  Activity Tolerance: Patient tolerated treatment well;Patient limited by fatigue Patient left: in chair;with call bell/phone within reach Nurse Communication: Mobility status;Other (comment) (desaturation with mobility) PT Visit Diagnosis: Other abnormalities of gait and mobility (R26.89);Muscle weakness (generalized) (M62.81);Difficulty in walking, not elsewhere classified (R26.2)     Time: 7829-5621 PT Time Calculation (min) (ACUTE ONLY): 29 min  Charges:    $Gait Training: 23-37 mins PT General Charges $$ ACUTE PT VISIT: 1 Visit                     Glenford Lanes, PT, DPT Acute Rehabilitation Services Office: [phone] Secure Chat Preferred  Riva Chester 06/06/2023, 11:46 AM

## 2023-06-06 NOTE — Consult Note (Signed)
 Palliative Medicine Inpatient Consult Note  Consulting Provider: Deforest Fast, MD   Reason for consult:   Palliative Care Consult Services Palliative Medicine Consult  Reason for Consult? goals of care   06/06/2023  HPI:  Per intake H&P -->  Patient is a 83 year old female with acute on chronic respiratory failure with hypoxia secondary to acute on chronic diastolic CHF. Pt found to have aspiration PNA. History of HTN, hyperlipidemia, respiratory failure on 2 L, PAF, GI bleed, aortic stenosis s/p TAVR, transient heart block, diabetes mellitus type II, CAD, hypothyroidism, GERD.  Palliative care has been asked to support additional goals of care conversations in the setting of high chronic disease burden.   Clinical Assessment/Goals of Care:  *Please note that this is a verbal dictation therefore any spelling or grammatical errors are due to the "Dragon Medical One" system interpretation.  I have reviewed medical records including EPIC notes, labs and imaging, received report from bedside RN, assessed the patient who is sitting up in the recliner chair in no acute distress.    I met with [name] to further discuss diagnosis prognosis, GOC, EOL wishes, disposition and options.   I introduced Palliative Medicine as specialized medical care for people living with serious illness. It focuses on providing relief from the symptoms and stress of a serious illness. The goal is to improve quality of life for both the patient and the family.  Medical History Review and Understanding:  A review of Jalisia's past medical history inclusive of hyperlipidemia, congestive heart failure, hypertension, coronary artery disease, type 2 diabetes, GERD, aortic stenosis requiring a traveler, hypothyroidism, and paroxysmal atrial fibrillation was completed.  Social History:  Dabney shares that she is from [place] .  She has been married twice though both spouses sadly died of  cancer.  She has 1 son who lives with her.  She formally worked as a Database administrator. 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 Nayara shares that she is a woman of faith.  Functional and Nutritional State:  Prior to hospitalization Noemy was able to mobilize with a walker, bathe herself, dress herself, feed herself.  She has multiple nieces and nephew who are available to provide additional support if needed. Jasmaine has had a good appetite.  Advance Directives:  A detailed discussion was had today regarding advanced directives.  Patient has never created advanced directives though would be interested in doing so while here.  Code Status:  Concepts specific to code status, artifical feeding and hydration, continued IV antibiotics and rehospitalization was had.  The difference between a aggressive medical intervention path  and a palliative comfort care path for this patient at this time was had.   Bailea is an established DO NOT RESUSCITATE DO NOT INTUBATE CODE STATUS.  Discussion:  Arayah and I reviewed the chronic progressive nature of congestive heart failure and how over time despite appropriate treatments he can worsen.  We also discussed her underlying lung disease in the setting of amiodarone  toxicity.    We reviewed patient's clinical goals which are to improve physically to the point whereby she can get back to her home.  She notes that right now she is on a lot of oxygen  and requires ongoing hospital admission.  We did discuss the worst case scenario of patient's chronic disease burden continuing to plague her with problems leading to recurrent rehospitalization's.  We did discuss the idea of hospice support should her body  be unable to overcome the restraints of her diseases. Jaclyne is open to outpatient palliative support on discharge.  We did discuss the possibility of hospice moving into the future which she  would be open to if her clinical condition warranted it.  She shares her husband had hospice and it was a very good experience.  Discussed the importance of continued conversation with family and their  medical providers regarding overall plan of care and treatment options, ensuring decisions are within the context of the patients values and GOCs.  Decision Maker: Normand Beckwith (Son): [phone] ([place])   SUMMARY OF RECOMMENDATIONS   DNAR/DNI  Open and honest conversations held in the setting of patient's acute on chronic disease burden  Advanced directives reviewed with patient who plans to complete these in the oncoming days  Plan for OP Palliative support on discharge  The PMT will continue to check in with Joyell every three days during hospitalization unless something acutely changes  Code Status/Advance Care Planning: DNAR/DNI  Palliative Prophylaxis:  Aspiration, Bowel Regimen, Delirium Protocol, Frequent Pain Assessment, Oral Care, Palliative Wound Care, and Turn Reposition  Additional Recommendations (Limitations, Scope, Preferences): Continue current care  Psycho-social/Spiritual:  Desire for further Chaplaincy support: Yes patient is a [place] Additional Recommendations: Education on the progressive nature of heart failure   Prognosis: Increased 71-month mortality risk in the setting of chronic disease burden, frail state, and recurrent re-hospitalizations.   Discharge Planning: Discharge to home once medically optimized  Vitals:   06/06/23 1000 06/06/23 1503  BP: (!) 128/37 (!) 134/46  Pulse: 63 60  Resp: 18 16  Temp: 97.6 F (36.4 C) 97.7 F (36.5 C)  SpO2: 95% 100%    Intake/Output Summary (Last 24 hours) at 06/06/2023 1743 Last data filed at 06/06/2023 1722 Gross per 24 hour  Intake 720 ml  Output 1950 ml  Net -1230 ml   Last Weight  Most recent update: 06/06/2023  5:02 AM    Weight  79.7 kg (175 lb 12.8 oz)            Gen: Frail elderly  Caucasian female HEENT: moist mucous membranes CV: Regular rate and irregular rhythm  PULM: On 4 L nasal cannula per minute breathing is even and nonlabored ABD: soft/nontender  EXT: No edema  Neuro: Alert and oriented x3   PPS: 40%   This conversation/these recommendations were discussed with patient primary care team, Dr. Drexel Gentles  Billing based on MDM: High Time: 76  __________________________________________ [facility] [place] Palliative Medicine Team Team Cell Phone: [phone] Please utilize secure chat with additional questions, if there is no response within 30 minutes please call the above phone number  Palliative Medicine Team providers are available by phone from 7am to 7pm daily and can be reached through the team cell phone.  Should this patient require assistance outside of these hours, please call the patient's attending physician.

## 2023-06-06 NOTE — Progress Notes (Signed)
 NAME:  Mary Mccarthy, MRN:  [id number], DOB:  1940-02-23, LOS: 23 ADMISSION DATE:  05/14/2023, CONSULTATION DATE:  06/04/2023 REFERRING MD:  Dr. Arien - [facility], CHIEF COMPLAINT:  Hypoxia   History of Present Illness:  A 83 year old female with past medical history significant for DM, HLD, paroxysmal atrial fibrillation not on anticoagulation due to GI bleed, severe aortic stenosis s/p TAVR, CAD, type 2 diabetes, and anemia who presented to the ED initially 4/7 for complaints of shortness of breath.  Given concerns for acute on chronic respiratory failure in the setting of decompensated heart failure patient was admitted per hospitalist.  During admission thus far patient has been aggressively diuresed and is currently net -10 L for admission thus far.  However she continues to remain oxygen  dependent at which time amiodarone  lung toxicity was considered and steroids were started.  Pulmonary consulted 4/21 On chart review it appears that amiodarone  was started August 2022.  Anticoagulation was discontinued due to GI bleeding. She reports shortness of breath ongoing for few months worse for 2 weeks. Started Albuterol  3 weeks ago and HOT Feb 2025 with 3 L [place] No smoking hx or asthma Reports occasional cough with white sputum, no wheezing, f/c/r, or CP. Pertinent  Medical History  DM, HLD, paroxysmal atrial fibrillation not on anticoagulation due to GI bleed, severe aortic stenosis s/p TAVR, CAD, type 2 diabetes, and anemia of chronic illness  Significant Hospital Events: Including procedures, antibiotic start and stop dates in addition to other pertinent events     Interim History / Subjective:  4/7 admitted with complaints of shortness of breath, admitted for acute on chronic hypoxic respiratory failure secondary to decompensated CHF 4/8 cardiology consulted 4/11 Diuretics held due to AKI  4/17 Steroids started for concern of amio lung toxocity  4/20 CT chest  concerning for multifocal  pneumonia.  4/29 diuresed 2.5 L in 2 days, down to 6 L [place], SpO2 97%, dropped to 91% with ambulation to bathroom. Uses I/S and FV  4/30 diuresed 0.7 L, on 6 L [place]. Better    Objective   Blood pressure (!) 150/43, pulse (!) 59, temperature 98.3 F (36.8 C), temperature source Oral, resp. rate 16, height 5\' 1"  (1.549 m), weight 79.7 kg, SpO2 97%.        Intake/Output Summary (Last 24 hours) at 06/06/2023 0957 Last data filed at 06/06/2023 0808 Gross per 24 hour  Intake 720 ml  Output 1400 ml  Net -680 ml   Filed Weights   06/04/23 0540 06/05/23 0433 06/06/23 0455  Weight: 81.7 kg 81 kg 79.7 kg    Examination: General: alert, oriented x4, and comfortable. On 6 L [place]. SpO2 96%  HENT: PERL, normal pharynx and oral mucosa. No LNE or thyromegaly. No JVD Lungs: symmetrical air entry bilaterally. Diffuse crackles. No wheezing Cardiovascular: NL S2. ESM 3/6. No g/r Abdomen: no distension or tenderness Extremities: trace edema. 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 Drexel Gentles   Labs   CBC: Recent Labs  Lab 05/31/23 0256 06/01/23 0303 06/02/23 0316 06/05/23 0253  WBC 9.9 9.0 8.7 7.7  HGB 7.9* 7.9* 7.8* 8.8*  HCT 24.8* 25.2* 25.1* 27.2*  MCV 92.5 93.3 93.3 93.5  PLT 179 201 225 252    Basic Metabolic Panel: Recent Labs  Lab 06/02/23 0316 06/03/23 0235 06/04/23 0307 06/05/23 0253 06/06/23 0248  NA 135 134* 135 137  136  K 4.9 4.4 4.6 4.2 4.7  CL 99 99 101 102 102  CO2 30 26 26 26 29   GLUCOSE 125* 146* 123* 158* 148*  BUN 58* 57* 50* 49* 48*  CREATININE 1.92* 1.57* 1.75* 1.62* 1.86*  CALCIUM  8.4* 8.6* 8.5* 8.3* 8.2*  MG  --   --   --  2.1 2.0  PHOS  --   --   --  2.5  --    GFR: Estimated Creatinine Clearance: 21.5 mL/min (A) (by C-G formula based on SCr of 1.86 mg/dL (H)). Recent Labs  Lab 05/31/23 0256 06/01/23 0303 06/02/23 0316 06/05/23 0253  WBC 9.9 9.0 8.7 7.7    Liver Function Tests: No results for input(s): "AST", "ALT", "ALKPHOS", "BILITOT", "PROT", "ALBUMIN" in the last 168 hours.  No results for input(s): "LIPASE", "AMYLASE" in the last 168 hours. No results for input(s): "AMMONIA" in the last 168 hours.  ABG    Component Value Date/Time   PHART 7.446 10/26/2020 0503   PCO2ART 40.6 10/26/2020 0503   PO2ART 84.1 10/26/2020 0503   HCO3 27.6 10/26/2020 0503   TCO2 28 10/26/2020 0750   O2SAT 96.8 10/26/2020 0503     Coagulation Profile: No results for input(s): "INR", "PROTIME" in the last 168 hours.  Cardiac Enzymes: No results for input(s): "CKTOTAL", "CKMB", "CKMBINDEX", "TROPONINI" in the last 168 hours.  HbA1C: Hgb A1c MFr Bld  Date/Time Value Ref Range Status  03/14/2023 09:24 AM 6.5 4.6 - 6.5 % Final    Comment:    Glycemic Control Guidelines for People with Diabetes:Non Diabetic:  <6%Goal of Therapy: <7%Additional Action Suggested:  >8%   03/01/2023 02:41 AM 6.0 (H) 4.8 - 5.6 % Final    Comment:    (NOTE) Pre diabetes:          5.7%-6.4%  Diabetes:              >6.4%  Glycemic control for   <7.0% adults with diabetes     CBG: Recent Labs  Lab 06/05/23 0617 06/05/23 1125 06/05/23 1545 06/05/23 2115 06/06/23 0610  GLUCAP 155* 159* 169* 168* 126*    Review of Systems:   Review of Systems  Constitutional:  Negative for chills, diaphoresis and fever.  HENT:  Negative for congestion, sinus pain and sore throat.   Respiratory:  Positive for  cough, sputum production and shortness of breath. Negative for hemoptysis, wheezing and stridor.   Cardiovascular:  Positive for orthopnea and leg swelling. Negative for chest pain and PND.  Gastrointestinal:  Negative for abdominal pain, diarrhea, heartburn, nausea and vomiting.  Genitourinary:  Negative for dysuria.  Skin:  Negative for rash.     Past Medical History:  She,  has a past medical history of ALLERGIC RHINITIS (06/24/2007), Anemia, AVM (arteriovenous malformation) of colon, Blood transfusion without reported diagnosis (05/2015), COLONIC POLYPS, HX OF (06/24/2007), Coronary artery disease, non-occlusive (05/2015), DEPRESSION (10/08/2006), DIABETES MELLITUS, TYPE II (10/05/2006), GERD (06/24/2007), History of CVA (cerebrovascular accident) (08/10/2020), HYPERLIPIDEMIA (10/08/2006), HYPERTENSION (10/08/2006),  INSOMNIA (09/24/2008), Left knee DJD, Nonsustained paroxysmal ventricular tachycardia (HCC) (10/2018), Osteoporosis (03/10/2016), PAF (paroxysmal atrial fibrillation) (HCC), PAF with RVR: CHA2DS2-VASc 9.  On Eliquis  and amiodarone . (08/11/2020), PEPTIC ULCER DISEASE (10/08/2006), S/P TAVR (transcatheter aortic valve replacement) (10/26/2020), and Severe aortic stenosis (08/2020).   Surgical History:   Past Surgical History:  Procedure Laterality Date   APPENDECTOMY     BIOPSY  10/21/2020   Procedure: BIOPSY;  Surgeon: Daina Drum, MD;  Location: [facility] ENDOSCOPY;  Service: Gastroenterology;;   BREAST BIOPSY     CARDIAC CATHETERIZATION N/A 05/21/2015   Procedure: Left Heart Cath and Coronary Angiography;  Surgeon: Arleen Lacer, MD;  Location: [facility] INVASIVE CV LAB;  Service: Cardiovascular;: Ost RI 60%, Ost RCA 30%, dLAD tapers to small vessel w/ 40-50%. Mildly elevated LVEDP. Normal LV Fxn.   COLONOSCOPY N/A 05/20/2015   Procedure: COLONOSCOPY;  Surgeon: Danette Duos, MD;  Location: [facility] ENDOSCOPY;  Service: Gastroenterology;  Laterality: N/A;   CORONARY CA2+ SCORE /  CARDIAC CT ANGIOGRAM  10/09/2019   Calcium  score 624.  82nd percentile. Dominant RCA: Mild (25-49%) proximal stenosis-distal bifurcation into PDA and PAV--< RPL branches.  LAD (1 major mid vessel diagonal) diffuse calcified plaque, mild proximal stenosis with minimal distal stenosis.  Small RI moderate ostial disease.  LCx-moderate mixed (50-69%) proximal stenosis.  Small dOM1 disease.  Trileaflet AoV, annular Ca2+ - probable AS   ESOPHAGOGASTRODUODENOSCOPY N/A 05/20/2015   Procedure: ESOPHAGOGASTRODUODENOSCOPY (EGD);  Surgeon: Danette Duos, MD;  Location[facility] ENDOSCOPY;  Service: Gastroenterology;  Laterality: N/A;   ESOPHAGOGASTRODUODENOSCOPY (EGD) WITH PROPOFOL  N/A 10/21/2020   Procedure: ESOPHAGOGASTRODUODENOSCOPY (EGD) WITH PROPOFOL ;  Surgeon: Daina Drum, MD;  Location: [facility] ENDOSCOPY;  Service: Gastroenterology;  Laterality: N/A;   INTRAOPERATIVE TRANSTHORACIC ECHOCARDIOGRAM N/A 10/26/2020   Procedure: INTRAOPERATIVE TRANSTHORACIC ECHOCARDIOGRAM;  Surgeon: Abel Hoe; Location: [facility] OR; Pre-TAVR well-visualized calcified AoV mean Grad 37 mmHg, AVA 0.72 cm => Post TAVR well-positioned supra-annular 26 mm Medtronic Evolut Pro valve placed with no PVL.  Mean gradient 14 mmHg.  AVR 1.58 cm.  Normal flow to RCA and LM-LAD.  ; EF 60 to 65%.  Degenerative severe MAC w/ mild MR.   RIGHT/LEFT HEART CATH AND CORONARY ANGIOGRAPHY N/A 09/29/2020   Procedure: RIGHT/LEFT HEART CATH AND CORONARY ANGIOGRAPHY;  Surgeon: Odie Benne, MD;  Location: [facility] INVASIVE CV LAB;  Service: Cardiovascular; pre-TAVR:  pRCA 20%, m-dRCA 30%. D LM-pLAD 20%. RI 30%, mLAD 20%.  Severe AS with mean gradient measured at 52.8 mmHg.   TONSILLECTOMY     TRANSCATHETER AORTIC VALVE REPLACEMENT, TRANSFEMORAL N/A 10/26/2020   Procedure: TRANSCATHETER AORTIC VALVE REPLACEMENT, TRANSFEMORAL;  Surgeon: Odie Benne, MD;  Location: [facility] OR;  Service: Open Heart Surgery;; Medtronic Evolut-Pro + (size 26 mm, model #  EVPROPLUS -26US, serial # [id number]); transient high-grade AV block noted so PPM left in place.   TRANSTHORACIC ECHOCARDIOGRAM  03/17/2019   a) 03/2019: EF 60 to 65%.  Moderate LVH.  GRII DD.  Mod-Severe AS (m grad 36 mmHg, peak 59 mmHg); b) 09/2019: EF 65-70%, No RWMA. Gr1 DD/hi LAP, Mild hi PAP. Mod LA Dil. MOD AS (mean Grad 34.5 mmHg).  STABLE   TRANSTHORACIC ECHOCARDIOGRAM  08/27/2020   Admitted for CVA/Afib RVR & CHF (Progression to SEVERE SYMPTOMATIC AS):  Severe Calcific Aortic Stenosis (VTI AVA estimated 0.86 cm, mean gradient 42 mmHg, V-max 4.36 m/s).  EF 55 to 60%.  Severe concentric LVH.  Unable to determine diastolic parameters.  Moderately elevated PAP.  Mild LA dilation.  Mild circumferential pericardial effusion.  Trivial MR.  Severe MAC.   TRANSTHORACIC ECHOCARDIOGRAM  11/25/2020   1 Month s/p TAVR: EF 55 to 60%.  No R WMA.  GR 1 DD.  Elevated LVEDP.  Normal RV size with mildly elevated RVP estimated 44 mmHg.  Oscillating density in the RV suspect calcified chordal apparatus versus calcified thrombus.  Moderate LA dilation.  Normal IVC/RA P => Well-positioned 26 mm Medtronic Evolut Pro THVwith mean AOV gradient 11 mmHg.  Trivial PVL   TRANSTHORACIC ECHOCARDIOGRAM  11/02/2020   a) Day 1 Post-op TAVR 10/27/20:  Well-Positioned Supra Annular Medtronic Evolut Pro THP.  Mean gradient 12 mmHg, peak 24 mmHg.  AVA 1.8 cm.  Trivial PVL.  EF 60 to 65%.  Normal LV function.  Mild LVH.  Severe LA dilation.  Mild RA dilation.  Severe MAC;; b) 11/02/20: Normal structure and function of the aortic valve prosthesis.  Mean gradient 9 mmHg (otherwise stable)   TUBAL LIGATION       Social History:   reports that she has never smoked. She has never used smokeless tobacco. She reports that she does not drink alcohol and does not use drugs.   Family History:  Her family history includes Alcohol abuse in an other family member; Arrhythmia in her sister; Arthritis in an other family member; CAD in her sister;  Cancer in her mother; Depression in an other family member; Diabetes in an other family member; Heart attack (age of onset: 82) in her sister; Heart disease in an other family member; Hyperlipidemia in an other family member; Hypertension in an other family member; Stroke in an other family member.   Allergies Allergies  Allergen Reactions   Ibuprofen Other (See Comments)    Bleeding events   Amiodarone  Other (See Comments)    Lung toxicity     Home Medications  Prior to Admission medications   Medication Sig Start Date End Date Taking? Authorizing Provider  acetaminophen  (TYLENOL ) 500 MG tablet Take 500 mg by mouth every 6 (six) hours as needed for moderate pain.   Yes [provider]  ALPRAZolam  (XANAX ) 0.25 MG tablet Take 1 tablet (0.25 mg total) by mouth 3 (three) times daily as needed for anxiety. 03/14/23  Yes Roslyn Coombe, MD  atorvastatin  (LIPITOR ) 40 MG tablet TAKE 1 TABLET BY MOUTH EVERY DAY 09/04/22  Yes Arleen Lacer, MD  [employer] VITAMIN B12 1000 MCG tablet TAKE 1 TABLET BY MOUTH EVERY DAY 03/26/23  Yes Roslyn Coombe, MD  furosemide  (LASIX ) 20 MG tablet 3 tabs by mouth once daily Patient taking differently: Take 80 mg by mouth daily. 3 tabs by mouth once daily 04/03/23  Yes Roslyn Coombe, MD  hydrALAZINE  (APRESOLINE ) 25 MG tablet Take 1 tablet (25 mg total) by mouth every 8 (eight) hours. 04/03/23  Yes Roslyn Coombe, MD  isosorbide  mononitrate (IMDUR ) 30 MG 24 hr tablet Take 0.5 tablets (15 mg total) by mouth daily. 04/03/23  Yes Roslyn Coombe, MD  levothyroxine  (SYNTHROID ) 25 MCG tablet TAKE 1 TABLET BY MOUTH EVERY DAY 03/26/23  Yes Roslyn Coombe, MD  metFORMIN  (GLUCOPHAGE ) 1000 MG tablet TAKE 1 TABLET BY MOUTH IN THE AM AND 1/2 IN THE PM 03/26/23  Yes Roslyn Coombe, MD  metoprolol  succinate (TOPROL -XL) 25 MG 24 hr tablet TAKE 1/2 TABLET BY MOUTH DAILY 03/26/23  Yes Roslyn Coombe, MD  mirtazapine  (REMERON ) 15 MG tablet TAKE 1 TABLET BY MOUTH EVERYDAY AT BEDTIME 10/30/22  Yes John,  Alveda Aures, MD  pantoprazole  (PROTONIX ) 40 MG tablet TAKE 1 TABLET BY MOUTH TWICE A DAY 12/07/22  Yes Ardia Kraft, PA-C  solifenacin  (VESICARE ) 5 MG tablet Take 1 tablet (5 mg total) by mouth daily. 03/14/23  Yes Roslyn Coombe, MD  amiodarone  (PACERONE ) 200 MG tablet TAKE 1 TABLET BY MOUTH EVERY DAY Patient not taking: Reported on 05/14/2023 02/06/23   Arnoldo Lapping, MD  blood glucose meter kit and supplies Dispense based on patient and insurance preference. Use up to four times daily as directed. E11.9 04/15/18   Roslyn Coombe, MD  Blood Glucose Monitoring Suppl (ONE TOUCH ULTRA 2) w/Device KIT Use as directed daily 06/01/15   Roslyn Coombe, MD  citalopram  (CELEXA ) 20 MG tablet Take 1 tablet (20 mg total) by mouth daily. Patient not taking: Reported on 05/14/2023 09/25/22   Roslyn Coombe, MD  Lancets [facility] DELICA PLUS [place]) MISC USE AS DIRECTED TEST 1-2 TIMES A DAY 09/05/20   Roslyn Coombe, MD   [facility] ULTRA test strip USE AS INSTRUCTED TEST 1-2 TIMES A DAY 10/12/20   Roslyn Coombe, MD        Arlyne Bering, MD [place] [facility]

## 2023-06-06 NOTE — Progress Notes (Signed)
 Rounding Note    Patient Name: Mary Mccarthy Date of Encounter: 06/06/2023   [facility] Cardiologist: Randene Bustard, MD   Subjective   Sitting up in bed, very talkative this morning, feels much better. Wants to discharge home.  Inpatient Medications    Scheduled Meds:  amLODipine   5 mg Oral Daily   atorvastatin   40 mg Oral Daily   enoxaparin  (LOVENOX ) injection  30 mg Subcutaneous Q24H   fesoterodine   4 mg Oral Daily   fluticasone   2 spray Each Nare BID   hydrALAZINE   50 mg Oral Q8H   insulin  aspart  0-15 Units Subcutaneous TID WC   insulin  aspart  6 Units Subcutaneous TID WC   insulin  glargine-yfgn  35 Units Subcutaneous Daily   isosorbide  mononitrate  60 mg Oral Daily   lactulose   20 g Oral BID   levothyroxine   25 mcg Oral Daily   metoprolol  succinate  12.5 mg Oral Daily   mirtazapine   15 mg Oral QHS   pantoprazole   40 mg Oral BID   predniSONE   40 mg Oral Q breakfast   sodium chloride  flush  3 mL Intravenous Q12H   Continuous Infusions:  PRN Meds: acetaminophen , albuterol , ALPRAZolam , guaiFENesin -dextromethorphan , sodium chloride  flush, trimethobenzamide    Vital Signs    Vitals:   06/05/23 1934 06/06/23 0019 06/06/23 0455 06/06/23 0808  BP: (!) 142/48 (!) 151/52 (!) 124/93 (!) 150/43  Pulse: (!) 57 (!) 56 (!) 53 (!) 59  Resp: 18 20 20 16   Temp: 97.7 F (36.5 C) 97.6 F (36.4 C) 97.6 F (36.4 C) 98.3 F (36.8 C)  TempSrc: Oral Oral Oral Oral  SpO2: 100% 97% 99% 97%  Weight:   79.7 kg   Height:        Intake/Output Summary (Last 24 hours) at 06/06/2023 0842 Last data filed at 06/06/2023 5784 Gross per 24 hour  Intake 960 ml  Output 1400 ml  Net -440 ml      06/06/2023    4:55 AM 06/05/2023    4:33 AM 06/04/2023    5:40 AM  Last 3 Weights  Weight (lbs) 175 lb 12.8 oz 178 lb 9.2 oz 180 lb 1.9 oz  Weight (kg) 79.742 kg 81 kg 81.7 kg      Telemetry    Sinus rhythm in the 60s - Personally Reviewed  ECG    No new tracings -  Personally Reviewed  Physical Exam   GEN: No acute distress.   Neck: No JVD Cardiac: RRR, 3/6 systolic murmur at left sternal border Respiratory: rhonchi on left, respirations unlabored GI: Soft, nontender, non-distended  MS: B LE edema 1+ Neuro:  Nonfocal  Psych: Normal affect   Labs    High Sensitivity Troponin:   Recent Labs  Lab 05/14/23 0922 05/14/23 1340 05/27/23 0404  TROPONINIHS 35* 31* 16     Chemistry Recent Labs  Lab 06/04/23 0307 06/05/23 0253 06/06/23 0248  NA 135 137 136  K 4.6 4.2 4.7  CL 101 102 102  CO2 26 26 29   GLUCOSE 123* 158* 148*  BUN 50* 49* 48*  CREATININE 1.75* 1.62* 1.86*  CALCIUM  8.5* 8.3* 8.2*  MG  --  2.1 2.0  GFRNONAA 28* 31* 26*  ANIONGAP 8 9 5     Lipids No results for input(s): "CHOL", "TRIG", "HDL", "LABVLDL", "LDLCALC", "CHOLHDL" in the last 168 hours.  Hematology Recent Labs  Lab 06/01/23 0303 06/02/23 0316 06/05/23 0253  WBC 9.0 8.7 7.7  RBC 2.70* 2.69* 2.91*  HGB 7.9* 7.8* 8.8*  HCT 25.2* 25.1* 27.2*  MCV 93.3 93.3 93.5  MCH 29.3 29.0 30.2  MCHC 31.3 31.1 32.4  RDW 17.0* 17.4* 17.9*  PLT 201 225 252   Thyroid  No results for input(s): "TSH", "FREET4" in the last 168 hours.  BNP Recent Labs  Lab 06/03/23 1217  BNP 322.8*    DDimer No results for input(s): "DDIMER" in the last 168 hours.   Radiology    No results found.  Cardiac Studies   Echo 05/15/23:  1. Left ventricular ejection fraction, by estimation, is 60 to 65%. The  left ventricle has normal function. The left ventricle has no regional  wall motion abnormalities. There is severe asymmetric left ventricular  hypertrophy of the septal segment. Left   ventricular diastolic function could not be evaluated.   2. Right ventricular systolic function is normal. The right ventricular  size is mildly enlarged. There is moderately elevated pulmonary artery  systolic pressure. The estimated right ventricular systolic pressure is  53.7 mmHg.   3. Left  atrial size was mildly dilated.   4. Right atrial size was mildly dilated.   5. The mitral valve is degenerative. Trivial mitral valve regurgitation.  Mild mitral stenosis. The mean mitral valve gradient is 3.0 mmHg. Severe  mitral annular calcification.   6. Tricuspid valve regurgitation is mild to moderate.   7. Mild paravalvular leak adjacent to the intervalvular fibrosa. The  aortic valve has been repaired/replaced. Aortic valve regurgitation is  mild. No aortic stenosis is present. There is a 26 mm [employer] stented  (TAVR) valve present in the aortic  position. Procedure Date: 10/26/2020. Echo findings are consistent with  normal structure and function of the aortic valve prosthesis. Echo  findings are consistent with perivalvular leak of the aortic prosthesis.  Aortic regurgitation PHT measures 514 msec.   Aortic valve mean gradient measures 9.0 mmHg. Aortic valve Vmax measures  1.88 m/s.   8. The inferior vena cava is dilated in size with <50% respiratory  variability, suggesting right atrial pressure of 15 mmHg.   Patient Profile     83 y.o. female with persistent atrial fibrillation, HFpEF, aortic stenosis status post TAVR, diabetes, obesity who was admitted on 05/14/2023 for acute on chronic diastolic heart failure.   Assessment & Plan    Acute on chronic diastolic heart failure CKD - sCr 1.86 today, (1.62) - baseline ?1.5 - was taking 60 mg lasix  at home  - suspect we need to resume this today - LE edema today   Persistent atrial fibrillation - remains in sinus rhythm - amiodarone  discontinued due to concern for amiodarone  lung toxicity - pt declines anticoagulation - question if we can consider multaq to maintain sinus rhythm   Pneumonitis Acute hypoxic respiratory failure - suspect related to amiodarone , which has been discontinued - PNA felt less likely, but still concerning on last CXR 06/03/23 - remains on prednisone  40 mg daily, per PCCM   Hypertension -  medications titrated this admission - continue 60 mg imdur , 50 mg hydralazine  TID, 12.5 mg toprol , 5 mg amlodipine  - BP fluctuating, would not be aggressive with BP control given age and frailty - also has not yet received lasix  PO   Deconditioned She wants to discharge home today. She appears much improved from Monday. No further recommendations from cardiology.       For questions or updates, please contact [facility] Please consult [email or web address] for contact info under  Signed, Warren Haber Tammela Bales, PA  06/06/2023, 8:42 AM

## 2023-06-07 ENCOUNTER — Other Ambulatory Visit (HOSPITAL_COMMUNITY): Payer: Self-pay

## 2023-06-07 DIAGNOSIS — J9620 Acute and chronic respiratory failure, unspecified whether with hypoxia or hypercapnia: Secondary | ICD-10-CM | POA: Diagnosis not present

## 2023-06-07 DIAGNOSIS — T462X1A Poisoning by other antidysrhythmic drugs, accidental (unintentional), initial encounter: Secondary | ICD-10-CM | POA: Diagnosis not present

## 2023-06-07 DIAGNOSIS — I5033 Acute on chronic diastolic (congestive) heart failure: Secondary | ICD-10-CM | POA: Diagnosis not present

## 2023-06-07 LAB — BASIC METABOLIC PANEL WITH GFR
Anion gap: 7 (ref 5–15)
BUN: 54 mg/dL — ABNORMAL HIGH (ref 8–23)
CO2: 27 mmol/L (ref 22–32)
Calcium: 8.5 mg/dL — ABNORMAL LOW (ref 8.9–10.3)
Chloride: 102 mmol/L (ref 98–111)
Creatinine, Ser: 1.97 mg/dL — ABNORMAL HIGH (ref 0.44–1.00)
GFR, Estimated: 25 mL/min — ABNORMAL LOW (ref >60)
Glucose, Bld: 125 mg/dL — ABNORMAL HIGH (ref 70–99)
Potassium: 4.2 mmol/L (ref 3.5–5.1)
Sodium: 136 mmol/L (ref 135–145)

## 2023-06-07 LAB — GLUCOSE, CAPILLARY
Glucose-Capillary: 127 mg/dL — ABNORMAL HIGH (ref 70–99)
Glucose-Capillary: 142 mg/dL — ABNORMAL HIGH (ref 70–99)

## 2023-06-07 LAB — MAGNESIUM: Magnesium: 2 mg/dL (ref 1.7–2.4)

## 2023-06-07 MED ORDER — HYDRALAZINE HCL 50 MG PO TABS
50.0000 mg | ORAL_TABLET | Freq: Three times a day (TID) | ORAL | 0 refills | Status: DC
  Filled 2023-06-07: qty 90, 30d supply, fill #0

## 2023-06-07 MED ORDER — ISOSORBIDE MONONITRATE ER 30 MG PO TB24
60.0000 mg | ORAL_TABLET | Freq: Every day | ORAL | 0 refills | Status: DC
Start: 2023-06-07 — End: 2023-07-10
  Filled 2023-06-07: qty 60, 30d supply, fill #0

## 2023-06-07 MED ORDER — FUROSEMIDE 10 MG/ML IJ SOLN
40.0000 mg | Freq: Once | INTRAMUSCULAR | Status: AC
  Administered 2023-06-07: 40 mg via INTRAVENOUS
  Filled 2023-06-07: qty 4

## 2023-06-07 MED ORDER — AMLODIPINE BESYLATE 5 MG PO TABS
5.0000 mg | ORAL_TABLET | Freq: Every day | ORAL | 1 refills | Status: DC
Start: 2023-06-07 — End: 2023-06-21
  Filled 2023-06-07: qty 30, 30d supply, fill #0

## 2023-06-07 MED ORDER — INSULIN GLARGINE 100 UNIT/ML SOLOSTAR PEN
40.0000 [IU] | PEN_INJECTOR | Freq: Every day | SUBCUTANEOUS | 0 refills | Status: DC
  Filled 2023-06-07: qty 15, 37d supply, fill #0

## 2023-06-07 MED ORDER — INSULIN PEN NEEDLE 32G X 4 MM MISC
0 refills | Status: DC
  Filled 2023-06-07: qty 100, 100d supply, fill #0

## 2023-06-07 MED ORDER — TORSEMIDE 20 MG PO TABS
40.0000 mg | ORAL_TABLET | Freq: Every day | ORAL | 1 refills | Status: DC
  Filled 2023-06-07 – 2023-07-07 (×2): qty 60, 30d supply, fill #0

## 2023-06-07 MED ORDER — PREDNISONE 20 MG PO TABS
40.0000 mg | ORAL_TABLET | Freq: Every day | ORAL | 1 refills | Status: DC
  Filled 2023-06-07 – 2023-07-09 (×2): qty 60, 30d supply, fill #0

## 2023-06-07 NOTE — Progress Notes (Signed)
 This chaplain responded to PMT NP-Michele consult for creating the Pt. Advance Directive:  HCPOA. The Pt is not completing a Living Will. The Pt. son-Bobby is visiting at the bedside.  The chaplain completed AD education with the Pt. The Pt. answered clarifying questions before calling the notary.  The chaplain is present with the Pt., Allayne Arabian, notary, and witnesses for the notarizing of the Pt. AD: HCPOA.  The Pt. is naming Normand Beckwith as her healthcare agent.  The chaplain gave the Pt. the original AD along with one copy. The chaplain scanned the Pt. AD into the Pt. EMR.  The chaplain is avaialble for F/U spiritual care as needed.  Chaplain Kathleene Papas [phone]

## 2023-06-07 NOTE — Care Management Important Message (Signed)
 Important Message  Patient Details  Name: Mary Mccarthy MRN: [id number] Date of Birth: 07-21-1940   Important Message Given:  Yes - Medicare IM     Janith Melnick 06/07/2023, 10:32 AM

## 2023-06-07 NOTE — TOC Transition Note (Addendum)
 Transition of Care [facility]) - Discharge Note   Patient Details  Name: Mary Mccarthy MRN: [id number] Date of Birth: [birth year]  Transition of [facility]) CM/SW Contact:  Jennett Model, RN Phone Number: 06/07/2023, 10:15 AM   Clinical Narrative:    For dc today, NCM notified Kelly with [facility], adding HHRN for new insulin  dx, patient will also have new oxygen  liter flow, she has oxygen  at home she states with [employer] 2 liters but now needs higher liter flow 6 liters with exertion,  NCM contacted Jermaine with [employer], she will need a 10 liter concentrator. Awaiting ambulatory sats and oxygen  order. NCM received consult for outpatient palliative services for patient.  Offered choice, she chose [facility].  NCM made referral to [facility] with [facility].    Final next level of care: Skilled Nursing Facility Barriers to Discharge: Continued Medical Work up   Patient Goals and CMS Choice Patient states their goals for this hospitalization and ongoing recovery are:: to get better and go home   Choice offered to / list presented to : Patient, Adult Children Daisy ownership interest in [facility].provided to:: Adult Children    Discharge Placement                       Discharge Plan and Services Additional resources added to the After Visit Summary for       Post Acute Care Choice: Skilled Nursing Facility                               Social Drivers of Health (SDOH) Interventions SDOH Screenings   Food Insecurity: No Food Insecurity (05/14/2023)  Recent Concern: Food Insecurity - Food Insecurity Present (03/01/2023)  Housing: Low Risk  (05/17/2023)  Recent Concern: Housing - High Risk (03/05/2023)  Transportation Needs: No Transportation Needs (05/17/2023)  Utilities: At Risk (05/14/2023)  Alcohol Screen: Low Risk  (05/17/2023)  Depression (PHQ2-9): Low Risk  (03/14/2023)  Financial Resource Strain: Low Risk  (05/17/2023)  Physical  Activity: Inactive (05/11/2022)  Social Connections: Socially Isolated (05/14/2023)  Stress: No Stress Concern Present (05/11/2022)  Tobacco Use: Low Risk  (05/14/2023)     Readmission Risk Interventions    11/04/2020   11:24 AM  Readmission Risk Prevention Plan  Transportation Screening Complete  PCP or Specialist Appt within 3-5 Days Complete  HRI or Home Care Consult Complete  Social Work Consult for Recovery Care Planning/Counseling Not Complete  Palliative Care Screening Not Applicable  Medication Review Oceanographer) Complete

## 2023-06-07 NOTE — Plan of Care (Signed)

## 2023-06-07 NOTE — Progress Notes (Signed)
 SATURATION QUALIFICATIONS: (This note is used to comply with regulatory documentation for home oxygen )  Patient Saturations on Room Air at Rest = NT  Patient Saturations on Room Air while Ambulating = NT  Patient Saturations on 8 Liters of oxygen  while Ambulating = 92%  Please briefly explain why patient needs home oxygen : Pt requires 3L O2 via  at rest to maintain SpO2 >93%. She ambulated short distances (10-10ft) on varying amounts of O2 (3L, 4L, and 6L). She desaturated to 64-83% SpO2. Pt was able to recover within <1 minute with seated rest, cues for PLB technique, and bump up to 8L O2. She ambulated 63ft on 8L without desaturating. Pt currently requires supplemental oxygen  to complete all functional mobility.   Glenford Lanes, PT, DPT Acute Rehabilitation Services Office: [phone] Secure Chat Preferred

## 2023-06-07 NOTE — Progress Notes (Signed)
 Heart Failure Stewardship Pharmacist Progress Note   PCP: Roslyn Coombe, MD PCP-Cardiologist: Randene Bustard, MD    HPI:  83 yo F with PMH of HTN, HLD, T2DM, CKD3, CAD, paroxysmal afib, recurrent GI bleed, severe aortic stenosis s/p TAVR complicated with transient heart block, respiratory failure on 2L [place] at BL, hypothyroidism, GERD   Patient previously admitted 02/2023 for CHF exacerbation. Patient was diuresed during admission and home lasix  was increased from prn to daily.   Presented to the ED on 4/7 with worsening SOB with exertion and leg swelling over the last 2 weeks. Also presented with productive cough, chest pain/tightness, NVD, and wheezing. Reports PCP instructed her to take an additional dose of 20mg  lasix  in the setting of weight gain of 3 lb - no improvement per pt. Endorses right flank pain 2/2 coughing.   In the ED, patient was hypertensive and hypoxic (did not present on her home O2) wheezing, rhonchi, rales and BL LE edema present on exam. Slight murmur present. JVD Patient diuresed with lasix  40mg  x2 in the ED. CXR showed mild to moderate pulmonary vascular congestion. No dense consolidation or lung collapse. ECHO 4/8 showed LVEF 60-65% (unchanged from prior 11/2022) with normal LV function. Severe asymmetric LV hypertrophy of septal segment. LV diastolic function could not be evaluated. Normal RV function. Mildly enlarged LV. MV is degenerative with trivial regurgitation. Mild stenosis and severe annular calcifications also present. Mild to moderate tricuspid valve regurgitation. Cardiology was consulted, planning with diuresis for now. Repeat CXR 4/17 showing mild decrease in patchy multifocal airspace a nodular airspace. Opacities and cardiomegaly. Repeat CXR on 4/21 with coarse and asymmetric bilateral opacity greater on the left not significantly changed from 4/17, favor bilateral infection rather than edema. CT chest on 4/20 with diffuse multifocal patchy airspace and  ground glass opacities throughout both lungs. Findings most consistent with multifocal pneumonia. PCCM consulted and believe this is most consistent with amiodarone  toxicity - she is now improving off amiodarone  and on steroids. Repeat CXR 4/27 showed persistent airspaces opacitiies, stable from prior exam. Diffuse BL multifocal PNA.   Met with patient and her son at bedside today. She states she is feeling much better and is ready to go home. No further questions or concerns at this time with her discharge medications.   Current HF Medications: Diuretic: furosemide  40 mg IV once Beta Blocker: metoprolol  succinate 12.5 mg daily  Other: isosorbide  mononitrate 60 mg daily, hydralazine  50 mg q8h  Prior to admission HF Medications: Diuretic: Lasix  60mg  daily Beta blocker: Metoprolol  succinate 12.5mg  daily  Other: Isosorbide  mononitrate 15mg  daily, hydralazine  25mg  q8h  Pertinent Lab Values: Serum creatinine 1.62>1.86>1.97 (BL 1.3-1.5), BUN 54, Potassium 4.2, Sodium 136, BNP 977.8>322.8, Magnesium  2.0, A1c 6.5   Vital Signs: Weight: 176 lbs (admission weight: 191 lbs) - standing weights Blood pressure: 140/40s Heart rate: 50s I/O: net -1.5L yesterday; net -18.3L since admission 4L    Medication Assistance / Insurance Benefits Check: Does the patient have prescription insurance?  Yes Type of insurance plan: Medicare  Outpatient Pharmacy:  Prior to admission outpatient pharmacy: [facility]  Is the patient willing to use [facility] TOC pharmacy at discharge? Yes Is the patient willing to transition their outpatient pharmacy to utilize a [facility] outpatient pharmacy? No    Assessment: 1. Acute on chronic diastolic CHF (LVEF 60-65%). NYHA class II symptoms. Strict I/Os and daily weights. Keep K>4 and Mg>2. Amiodarone  toxicity per PCCM. Concern for multifocal PNA and planning f/u with pulm  clinic 8 weeks post discharge for steroid taper and O2 eval.  - Furosemide  40 mg IV x 1 today.  Creatinine bumped today 1.62>1.86>1.97. Plan for torsemide  40 mg daily at discharge.  - Continue hydralazine  50 mg q8h and Imdur  60 mg daily - Agree with deferring SGLT2i given hx of 3 UTI in prior 1-2 months  - Continue metoprolol  succinate 12.5 mg daily - HR stable 50-60s.   Plan: 1) Medication changes recommended at this time: - Agree with torsemide  40 mg daily at discharge   2) Patient assistance: - Contacted son and he is interested in utilizing [facility] for medication changes upon discharge  but would [facility] and remaining prescriptions  - Son reported that current medications are affordable  3)  Education  - Patient has been educated on current HF medications and potential additions to HF medication regimen - Patient verbalizes understanding that over the next few months, these medication doses may change and more medications may be added to optimize HF regimen - Patient has been educated on basic disease state pathophysiology and goals of therapy  Jerilyn Monte, PharmD, BCPS Heart Failure Stewardship Pharmacist Phone [phone]

## 2023-06-07 NOTE — Progress Notes (Signed)
 NAME:  Mary Mccarthy, MRN:  [id number], DOB:  14-Sep-1940, LOS: 24 ADMISSION DATE:  05/14/2023, CONSULTATION DATE:  06/04/2023 REFERRING MD:  Dr. Arien - [facility], CHIEF COMPLAINT:  Hypoxia   History of Present Illness:  A 83 year old female with past medical history significant for DM, HLD, paroxysmal atrial fibrillation not on anticoagulation due to GI bleed, severe aortic stenosis s/p TAVR, CAD, type 2 diabetes, and anemia who presented to the ED initially 4/7 for complaints of shortness of breath.  Given concerns for acute on chronic respiratory failure in the setting of decompensated heart failure patient was admitted per hospitalist.  During admission thus far patient has been aggressively diuresed and is currently net -10 L for admission thus far.  However she continues to remain oxygen  dependent at which time amiodarone  lung toxicity was considered and steroids were started.  Pulmonary consulted 4/21 On chart review it appears that amiodarone  was started August 2022.  Anticoagulation was discontinued due to GI bleeding. She reports shortness of breath ongoing for few months worse for 2 weeks. Started Albuterol  3 weeks ago and HOT Feb 2025 with 3 L [place] No smoking hx or asthma Reports occasional cough with white sputum, no wheezing, f/c/r, or CP. Pertinent  Medical History  DM, HLD, paroxysmal atrial fibrillation not on anticoagulation due to GI bleed, severe aortic stenosis s/p TAVR, CAD, type 2 diabetes, and anemia of chronic illness  Significant Hospital Events: Including procedures, antibiotic start and stop dates in addition to other pertinent events     Interim History / Subjective:  4/7 admitted with complaints of shortness of breath, admitted for acute on chronic hypoxic respiratory failure secondary to decompensated CHF 4/8 cardiology consulted 4/11 Diuretics held due to AKI  4/17 Steroids started for concern of amio lung toxocity  4/20 CT chest  concerning for multifocal  pneumonia.  4/29 diuresed 2.5 L in 2 days, down to 6 L [place], SpO2 97%, dropped to 91% with ambulation to bathroom. Uses I/S and FV  4/30 diuresed 0.7 L, on 6 L [place]. Better  5/1 On 3 L [place], feels better   Objective   Blood pressure (!) 145/42, pulse (!) 51, temperature 98 F (36.7 C), temperature source Oral, resp. rate 19, height 5\' 1"  (1.549 m), weight 80 kg, SpO2 100%.        Intake/Output Summary (Last 24 hours) at 06/07/2023 0851 Last data filed at 06/06/2023 1722 Gross per 24 hour  Intake 240 ml  Output 550 ml  Net -310 ml   Filed Weights   06/05/23 0433 06/06/23 0455 06/07/23 0359  Weight: 81 kg 79.7 kg 80 kg    Examination: General: alert, oriented x4, and comfortable. On 6 L [place]. SpO2 96%  HENT: PERL, normal pharynx and oral mucosa. No LNE or thyromegaly. No JVD Lungs: symmetrical air entry bilaterally. Diffuse crackles. No wheezing Cardiovascular: NL S2. ESM 3/6. No g/r Abdomen: no distension or tenderness Extremities: trace edema. Symmetrical  Neuro: nonfocal    Resolved Hospital Problem list   AKI  Assessment & Plan:  Acute on chronic respiratory failure, HOT 3 L [place] since Feb 2025 D-CHF exacerbation (Echo EF 60%, LVH, RVSP 53.7 mmHg) Probable amiodarone  pulm toxicity Amiodarone  was d/c 05/14/2023 Steroids started 05/24/2023, down to Prednisone  40 mg po daily (05/29/2023) Do not taper steroids for now Keep O2 for SpO2 >90% F/u up pulm clinic after 8 weeks when taper will be considered and to reassess oxygen  needs and repeat chest CT  Continue IV Lasix   S/p one dose of Diamox   Fluid restriction Continue I/S and FV 6 MWT, d/w PT Daily weight  Labs   CBC: Recent Labs  Lab 06/01/23 0303 06/02/23 0316 06/05/23 0253  WBC 9.0 8.7 7.7  HGB 7.9* 7.8* 8.8*  HCT 25.2* 25.1* 27.2*  MCV 93.3 93.3 93.5  PLT 201 225 252    Basic Metabolic Panel: Recent Labs  Lab 06/03/23 0235 06/04/23 0307 06/05/23 0253 06/06/23 0248 06/07/23 0220  NA 134* 135 137 136 136  K  4.4 4.6 4.2 4.7 4.2  CL 99 101 102 102 102  CO2 26 26 26 29 27   GLUCOSE 146* 123* 158* 148* 125*  BUN 57* 50* 49* 48* 54*  CREATININE 1.57* 1.75* 1.62* 1.86* 1.97*  CALCIUM  8.6* 8.5* 8.3* 8.2* 8.5*  MG  --   --  2.1 2.0 2.0  PHOS  --   --  2.5  --   --    GFR: Estimated Creatinine Clearance: 20.4 mL/min (A) (by C-G formula based on SCr of 1.97 mg/dL (H)). Recent Labs  Lab 06/01/23 0303 06/02/23 0316 06/05/23 0253  WBC 9.0 8.7 7.7    Liver Function Tests: No results for input(s): "AST", "ALT", "ALKPHOS", "BILITOT", "PROT", "ALBUMIN" in the last 168 hours.  No results for input(s): "LIPASE", "AMYLASE" in the last 168 hours. No results for input(s): "AMMONIA" in the last 168 hours.  ABG    Component Value Date/Time   PHART 7.446 10/26/2020 0503   PCO2ART 40.6 10/26/2020 0503   PO2ART 84.1 10/26/2020 0503   HCO3 27.6 10/26/2020 0503   TCO2 28 10/26/2020 0750   O2SAT 96.8 10/26/2020 0503     Coagulation Profile: No results for input(s): "INR", "PROTIME" in the last 168 hours.  Cardiac Enzymes: No results for input(s): "CKTOTAL", "CKMB", "CKMBINDEX", "TROPONINI" in the last 168 hours.  HbA1C: Hgb A1c MFr Bld  Date/Time Value Ref Range Status  03/14/2023 09:24 AM 6.5 4.6 - 6.5 % Final    Comment:    Glycemic Control Guidelines for People with Diabetes:Non Diabetic:  <6%Goal of Therapy: <7%Additional Action Suggested:  >8%   03/01/2023 02:41 AM 6.0 (H) 4.8 - 5.6 % Final    Comment:    (NOTE) Pre diabetes:          5.7%-6.4%  Diabetes:              >6.4%  Glycemic control for   <7.0% adults with diabetes     CBG: Recent Labs  Lab 06/06/23 1023 06/06/23 1253 06/06/23 1722 06/06/23 2112 06/07/23 0621  GLUCAP 107* 196* 172* 215* 127*    Review of Systems:   Review of Systems  Constitutional:  Negative for chills, diaphoresis and fever.  HENT:  Negative for congestion, sinus pain and sore throat.   Respiratory:  Positive for cough, sputum production and  shortness of breath. Negative for hemoptysis, wheezing and stridor.   Cardiovascular:  Positive for orthopnea and leg swelling. Negative for chest pain and PND.  Gastrointestinal:  Negative for abdominal pain, diarrhea, heartburn, nausea and vomiting.  Genitourinary:  Negative for dysuria.  Skin:  Negative for rash.     Past Medical History:  She,  has a past medical history of ALLERGIC RHINITIS (06/24/2007), Anemia, AVM (arteriovenous malformation) of colon, Blood transfusion without reported diagnosis (05/2015), COLONIC POLYPS, HX OF (06/24/2007), Coronary artery disease, non-occlusive (05/2015), DEPRESSION (10/08/2006), DIABETES MELLITUS, TYPE II (10/05/2006), GERD (06/24/2007), History of CVA (cerebrovascular accident) (08/10/2020), HYPERLIPIDEMIA (10/08/2006), HYPERTENSION (10/08/2006), INSOMNIA (09/24/2008), Left knee  DJD, Nonsustained paroxysmal ventricular tachycardia (HCC) (10/2018), Osteoporosis (03/10/2016), PAF (paroxysmal atrial fibrillation) (HCC), PAF with RVR: CHA2DS2-VASc 9.  On Eliquis  and amiodarone . (08/11/2020), PEPTIC ULCER DISEASE (10/08/2006), S/P TAVR (transcatheter aortic valve replacement) (10/26/2020), and Severe aortic stenosis (08/2020).   Surgical History:   Past Surgical History:  Procedure Laterality Date   APPENDECTOMY     BIOPSY  10/21/2020   Procedure: BIOPSY;  Surgeon: Daina Drum, MD;  Location: [facility] ENDOSCOPY;  Service: Gastroenterology;;   BREAST BIOPSY     CARDIAC CATHETERIZATION N/A 05/21/2015   Procedure: Left Heart Cath and Coronary Angiography;  Surgeon: Arleen Lacer, MD;  Location: [facility] INVASIVE CV LAB;  Service: Cardiovascular;: Ost RI 60%, Ost RCA 30%, dLAD tapers to small vessel w/ 40-50%. Mildly elevated LVEDP. Normal LV Fxn.   COLONOSCOPY N/A 05/20/2015   Procedure: COLONOSCOPY;  Surgeon: Danette Duos, MD;  Location: [facility] ENDOSCOPY;  Service: Gastroenterology;  Laterality: N/A;   CORONARY CA2+ SCORE / CARDIAC CT ANGIOGRAM  10/09/2019    Calcium  score 624.  82nd percentile. Dominant RCA: Mild (25-49%) proximal stenosis-distal bifurcation into PDA and PAV--< RPL branches.  LAD (1 major mid vessel diagonal) diffuse calcified plaque, mild proximal stenosis with minimal distal stenosis.  Small RI moderate ostial disease.  LCx-moderate mixed (50-69%) proximal stenosis.  Small dOM1 disease.  Trileaflet AoV, annular Ca2+ - probable AS   ESOPHAGOGASTRODUODENOSCOPY N/A 05/20/2015   Procedure: ESOPHAGOGASTRODUODENOSCOPY (EGD);  Surgeon: Danette Duos, MD;  Location: [facility] ENDOSCOPY;  Service: Gastroenterology;  Laterality: N/A;   ESOPHAGOGASTRODUODENOSCOPY (EGD) WITH PROPOFOL  N/A 10/21/2020   Procedure: ESOPHAGOGASTRODUODENOSCOPY (EGD) WITH PROPOFOL ;  Surgeon: Daina Drum, MD;  Location: [facility] ENDOSCOPY;  Service: Gastroenterology;  Laterality: N/A;   INTRAOPERATIVE TRANSTHORACIC ECHOCARDIOGRAM N/A 10/26/2020   Procedure: INTRAOPERATIVE TRANSTHORACIC ECHOCARDIOGRAM;  Surgeon: Abel Hoe; Location: [facility] OR; Pre-TAVR well-visualized calcified AoV mean Grad 37 mmHg, AVA 0.72 cm => Post TAVR well-positioned supra-annular 26 mm Medtronic Evolut Pro valve placed with no PVL.  Mean gradient 14 mmHg.  AVR 1.58 cm.  Normal flow to RCA and LM-LAD.  ; EF 60 to 65%.  Degenerative severe MAC w/ mild MR.   RIGHT/LEFT HEART CATH AND CORONARY ANGIOGRAPHY N/A 09/29/2020   Procedure: RIGHT/LEFT HEART CATH AND CORONARY ANGIOGRAPHY;  Surgeon: Odie Benne, MD;  Location: [facility] INVASIVE CV LAB;  Service: Cardiovascular; pre-TAVR:  pRCA 20%, m-dRCA 30%. D LM-pLAD 20%. RI 30%, mLAD 20%.  Severe AS with mean gradient measured at 52.8 mmHg.   TONSILLECTOMY     TRANSCATHETER AORTIC VALVE REPLACEMENT, TRANSFEMORAL N/A 10/26/2020   Procedure: TRANSCATHETER AORTIC VALVE REPLACEMENT, TRANSFEMORAL;  Surgeon: Odie Benne, MD;  Location: [facility] OR;  Service: Open Heart Surgery;; Medtronic Evolut-Pro + (size 26 mm, model # EVPROPLUS -26US, serial # [id number]);  transient high-grade AV block noted so PPM left in place.   TRANSTHORACIC ECHOCARDIOGRAM  03/17/2019   a) 03/2019: EF 60 to 65%.  Moderate LVH.  GRII DD.  Mod-Severe AS (m grad 36 mmHg, peak 59 mmHg); b) 09/2019: EF 65-70%, No RWMA. Gr1 DD/hi LAP, Mild hi PAP. Mod LA Dil. MOD AS (mean Grad 34.5 mmHg).  STABLE   TRANSTHORACIC ECHOCARDIOGRAM  08/27/2020   Admitted for CVA/Afib RVR & CHF (Progression to SEVERE SYMPTOMATIC AS):  Severe Calcific Aortic Stenosis (VTI AVA estimated 0.86 cm, mean gradient 42 mmHg, V-max 4.36 m/s).  EF 55 to 60%.  Severe concentric LVH.  Unable to determine diastolic parameters.  Moderately elevated PAP.  Mild LA dilation.  Mild circumferential pericardial  effusion.  Trivial MR.  Severe MAC.   TRANSTHORACIC ECHOCARDIOGRAM  11/25/2020   1 Month s/p TAVR: EF 55 to 60%.  No R WMA.  GR 1 DD.  Elevated LVEDP.  Normal RV size with mildly elevated RVP estimated 44 mmHg.  Oscillating density in the RV suspect calcified chordal apparatus versus calcified thrombus.  Moderate LA dilation.  Normal IVC/RA P => Well-positioned 26 mm Medtronic Evolut Pro THVwith mean AOV gradient 11 mmHg.  Trivial PVL   TRANSTHORACIC ECHOCARDIOGRAM  11/02/2020   a) Day 1 Post-op TAVR 10/27/20:  Well-Positioned Supra Annular Medtronic Evolut Pro THP.  Mean gradient 12 mmHg, peak 24 mmHg.  AVA 1.8 cm.  Trivial PVL.  EF 60 to 65%.  Normal LV function.  Mild LVH.  Severe LA dilation.  Mild RA dilation.  Severe MAC;; b) 11/02/20: Normal structure and function of the aortic valve prosthesis.  Mean gradient 9 mmHg (otherwise stable)   TUBAL LIGATION       Social History:   reports that she has never smoked. She has never used smokeless tobacco. She reports that she does not drink alcohol and does not use drugs.   Family History:  Her family history includes Alcohol abuse in an other family member; Arrhythmia in her sister; Arthritis in an other family member; CAD in her sister; Cancer in her mother; Depression in an  other family member; Diabetes in an other family member; Heart attack (age of onset: 108) in her sister; Heart disease in an other family member; Hyperlipidemia in an other family member; Hypertension in an other family member; Stroke in an other family member.   Allergies Allergies  Allergen Reactions   Ibuprofen Other (See Comments)    Bleeding events   Amiodarone  Other (See Comments)    Lung toxicity     Home Medications  Prior to Admission medications   Medication Sig Start Date End Date Taking? Authorizing Provider  acetaminophen  (TYLENOL ) 500 MG tablet Take 500 mg by mouth every 6 (six) hours as needed for moderate pain.   Yes [provider]  ALPRAZolam  (XANAX ) 0.25 MG tablet Take 1 tablet (0.25 mg total) by mouth 3 (three) times daily as needed for anxiety. 03/14/23  Yes Roslyn Coombe, MD  atorvastatin  (LIPITOR ) 40 MG tablet TAKE 1 TABLET BY MOUTH EVERY DAY 09/04/22  Yes Arleen Lacer, MD  [employer] VITAMIN B12 1000 MCG tablet TAKE 1 TABLET BY MOUTH EVERY DAY 03/26/23  Yes Roslyn Coombe, MD  furosemide  (LASIX ) 20 MG tablet 3 tabs by mouth once daily Patient taking differently: Take 80 mg by mouth daily. 3 tabs by mouth once daily 04/03/23  Yes Roslyn Coombe, MD  hydrALAZINE  (APRESOLINE ) 25 MG tablet Take 1 tablet (25 mg total) by mouth every 8 (eight) hours. 04/03/23  Yes Roslyn Coombe, MD  isosorbide  mononitrate (IMDUR ) 30 MG 24 hr tablet Take 0.5 tablets (15 mg total) by mouth daily. 04/03/23  Yes Roslyn Coombe, MD  levothyroxine  (SYNTHROID ) 25 MCG tablet TAKE 1 TABLET BY MOUTH EVERY DAY 03/26/23  Yes Roslyn Coombe, MD  metFORMIN  (GLUCOPHAGE ) 1000 MG tablet TAKE 1 TABLET BY MOUTH IN THE AM AND 1/2 IN THE PM 03/26/23  Yes Roslyn Coombe, MD  metoprolol  succinate (TOPROL -XL) 25 MG 24 hr tablet TAKE 1/2 TABLET BY MOUTH DAILY 03/26/23  Yes Roslyn Coombe, MD  mirtazapine  (REMERON ) 15 MG tablet TAKE 1 TABLET BY MOUTH EVERYDAY AT BEDTIME 10/30/22  Yes Roslyn Coombe, MD  pantoprazole  (PROTONIX )  40 MG tablet TAKE 1 TABLET BY MOUTH TWICE A DAY 12/07/22  Yes Ardia Kraft, PA-C  solifenacin  (VESICARE ) 5 MG tablet Take 1 tablet (5 mg total) by mouth daily. 03/14/23  Yes Roslyn Coombe, MD  amiodarone  (PACERONE ) 200 MG tablet TAKE 1 TABLET BY MOUTH EVERY DAY Patient not taking: Reported on 05/14/2023 02/06/23   Arnoldo Lapping, MD  blood glucose meter kit and supplies Dispense based on patient and insurance preference. Use up to four times daily as directed. E11.9 04/15/18   Roslyn Coombe, MD  Blood Glucose Monitoring Suppl (ONE TOUCH ULTRA 2) w/Device KIT Use as directed daily 06/01/15   Roslyn Coombe, MD  citalopram  (CELEXA ) 20 MG tablet Take 1 tablet (20 mg total) by mouth daily. Patient not taking: Reported on 05/14/2023 09/25/22   Roslyn Coombe, MD  Lancets [facility] DELICA PLUS [place]) MISC USE AS DIRECTED TEST 1-2 TIMES A DAY 09/05/20   Roslyn Coombe, MD  [facility] [employer] ULTRA test strip USE AS INSTRUCTED TEST 1-2 TIMES A DAY 10/12/20   Roslyn Coombe, MD        Arlyne Bering, MD [facility]

## 2023-06-07 NOTE — Discharge Summary (Signed)
 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   Acute on chronic diastolic CHF (congestive heart failure) (HCC)   PAF (paroxysmal atrial fibrillation) (HCC)   Coronary artery disease, non-occlusive   Hypertensive urgency   CKD (chronic kidney disease), stage IV (HCC)   Type 2 diabetes mellitus with hyperlipidemia (HCC)   Anxiety with depression   Hypothyroidism   Normocytic anemia   Aspiration pneumonia (HCC)   Discharge Condition: Slowly improving  Diet recommendation: Diabetic, healthy  Filed Weights   06/05/23 0433 06/06/23 0455 06/07/23 0359  Weight: 81 kg 79.7 kg 80 kg    History of present illness:  83 yo female with the past medical history of hypertension, hyperlipidemia, paroxysmal atrial fibrillation, aortic stenosis sp TAVR, T2DM and obesity who presented with dyspnea and lower extremity edema.  Found to have acute on chronic HFpEF with worsening hypoxia secondary to likely amiodarone  toxicity.  - 4/21 pulmonary consulted, amiodarone  discontinued and prednisone  taper over 2 to 4 weeks recommended - Complicated by ongoing issues with hypoxemia despite diuresis    Hospital Course:   Acute hypoxic respiratory failure -Multifactorial in the setting of chronic diastolic CHF, amiodarone  toxicity also suspected -On 2 L O2 at baseline, with significant hypoxia noted with minimal exertion this admission which persisted despite diuresis -Primary concerns are CHF and amiodarone  lung toxicity -She would desaturate significantly after coughing spell or minimal activity,  requiring up to 15 L of O2 -Pulmonary following, Now slowly improving, transition to 4 L O2 at rest and 6 L with activity - Pulmonary follow-up   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  Evaluated by pulmonology 4/21, concern for amiodarone  toxicity.  Discontinued amiodarone , initiated corticosteroids.  Transitioned from IV Solu-Medrol  to prednisone  40 mg daily.  >  Recommended to continue same dose of prednisone  daily for 6 to 8 weeks, pulmonary follow-up in 3 weeks   Acute on chronic HFpEF - Diuresed with IV Lasix , 9.5 L negative, volume status improving - Creatinine stabilizing, GDMT limited by AKI/CKD  - See discussion above, diuresed better in the last 72 hours, oxygen  requirement slightly better, - Albumin is 2.1, switch to torsemide  at discharge   Acute kidney injury on CKD 3B - Baseline creatinine around 1.5, creatinine peaked at 3, now improving, monitor   Diarrhea -Discontinued Ensure, improving, monitor   Persistent atrial fibrillation -Amiodarone  discontinued. -She has declined anticoagulation in the past   Diabetes mellitus -CBGs uncontrolled this admission, worsened in the setting of steroids, now on glargine and meal coverage insulin    CAD/hypertension - Continue metoprolol , Imdur , hydralazine , amlodipine .  Statin on board.  Not on aspirin .   Aortic stenosis s/p TAVR - Stable.   Physical debilitation muscle weakness - Exacerbated by hypoxia and deconditioning.  Evaluated by PT/OT.  -Patient declines SNF - Discharged home with home health services  Discharge Exam: Vitals:   06/06/23 2318 06/07/23 0359  BP: (!) 117/92 (!) 145/42  Pulse: (!) 52 (!) 51  Resp: 18 19  Temp: 98 F (36.7 C) 98 F (36.7 C)  SpO2: 100% 100%   General exam: Elderly  frail chronically ill female laying in bed, AO x 3 HEENT: No JVD CVS: S1-S2, regular rhythm Lungs: Few scattered  rhonchi, rare Rales Abdomen: Soft, nontender, bowel sounds present  Extremities: Trace edema Skin: No rashes Psychiatry:  Mood & affect appropriate.   Discharge Instructions   Discharge Instructions     Diet - low sodium heart healthy   Complete by: As directed    Diet Carb Modified   Complete by: As directed    Increase activity slowly   Complete by: As directed       Allergies as of 06/07/2023       Reactions   Ibuprofen Other (See Comments)   Bleeding events   Amiodarone  Other (See Comments)   Lung toxicity        Medication List     STOP taking these medications    amiodarone  200 MG tablet Commonly known as: PACERONE    citalopram  20 MG tablet Commonly known as: CELEXA    furosemide  20 MG tablet Commonly known as: LASIX        TAKE these medications    acetaminophen  500 MG tablet Commonly known as: TYLENOL  Take 500 mg by mouth every 6 (six) hours as needed for moderate pain.   ALPRAZolam  0.25 MG tablet Commonly known as: XANAX  Take 1 tablet (0.25 mg total) by mouth 3 (three) times daily as needed for anxiety.   amLODipine  5 MG tablet Commonly known as: NORVASC  Take 1 tablet (5 mg total) by mouth daily.   atorvastatin  40 MG tablet Commonly known as: LIPITOR  TAKE 1 TABLET BY MOUTH EVERY DAY   blood glucose meter kit and supplies Dispense based on patient and insurance preference. Use up to four times daily as directed. E11.9   [employer] VITAMIN B12 1000 MCG tablet Generic drug: cyanocobalamin  TAKE 1 TABLET BY MOUTH EVERY DAY   hydrALAZINE  50 MG tablet Commonly known as: APRESOLINE  Take 1 tablet (50 mg total) by mouth 3 (three) times daily. What changed:  medication strength how much to take when to take this   insulin  glargine 100 UNIT/ML Solostar Pen Commonly known as: LANTUS  Inject 40 Units into the skin daily.   Insulin  Pen Needle 32G X 4 MM Misc Use with Lantus  pen   isosorbide  mononitrate 30 MG 24 hr tablet Commonly known as:  IMDUR  Take 2 tablets (60 mg total) by mouth daily. What changed: how much to take   levothyroxine  25 MCG tablet Commonly known as: SYNTHROID  TAKE 1 TABLET BY MOUTH EVERY DAY   metFORMIN  1000 MG tablet Commonly known as: GLUCOPHAGE  TAKE 1 TABLET BY MOUTH IN THE AM AND 1/2 IN THE PM   metoprolol  succinate 25 MG 24 hr tablet Commonly known as: TOPROL -XL TAKE 1/2 TABLET BY MOUTH DAILY   mirtazapine  15 MG tablet Commonly known as: REMERON  TAKE 1 TABLET BY MOUTH EVERYDAY AT BEDTIME   ONE TOUCH ULTRA 2 w/Device Kit Use as directed daily   OneTouch Delica Plus Lancet33G Misc USE AS DIRECTED TEST 1-2 TIMES A DAY   OneTouch Ultra test strip Generic drug: glucose blood USE AS INSTRUCTED TEST 1-2 TIMES A DAY   pantoprazole  40 MG tablet Commonly known as: PROTONIX  TAKE 1 TABLET BY MOUTH TWICE A DAY   predniSONE  20 MG tablet Commonly known as: DELTASONE  Take 2 tablets (40 mg total) by mouth daily with breakfast. Start taking on: Jun 08, 2023   solifenacin  5 MG tablet Commonly known as: VESICARE  Take 1 tablet (5 mg total) by mouth daily.   torsemide   20 MG tablet Commonly known as: DEMADEX  Take 2 tablets (40 mg total) by mouth daily.               Durable Medical Equipment  (From admission, onward)           Start     Ordered   06/07/23 1025  For home use only DME oxygen   Once       Comments: Needs 4 L O2 at rest and 6 L with activity  Question Answer Comment  Length of Need 6 Months   Mode or (Route) Nasal cannula   Liters per Minute 6   Oxygen  conserving device Yes   Oxygen  delivery system Other see comments      06/07/23 1024           Allergies  Allergen Reactions   Ibuprofen Other (See Comments)    Bleeding events   Amiodarone  Other (See Comments)    Lung toxicity    Follow-up Information     [facility] Follow [facility].   Specialty: Home Health Services Why: Agency will call you to set up apt times Contact information: [address] [place] [address] [phone]                  The results of significant diagnostics from this hospitalization (including imaging, microbiology, ancillary and laboratory) are listed below for reference.    Significant Diagnostic Studies: DG Chest 2 View Result Date: 06/03/2023 CLINICAL DATA:  Hypoxia EXAM: CHEST - 2 VIEW COMPARISON:  05/27/2023 FINDINGS: Cardiac shadow is enlarged but stable. Changes of prior TAVR are seen. Persistent airspace opacities are again identified relatively stable when compared with the prior exam. No new focal abnormality is seen. IMPRESSION: Diffuse bilateral multifocal pneumonia. Electronically Signed   By: Violeta Grey M.D.   On: 06/03/2023 11:14   CT CHEST WO CONTRAST Result Date: 05/28/2023 CLINICAL DATA:  Shortness of breath. Diffuse interstitial lung disease. EXAM: CT CHEST WITHOUT CONTRAST TECHNIQUE: Multidetector CT imaging of the chest was performed following the standard protocol without IV contrast. RADIATION DOSE REDUCTION: This exam was performed according to the departmental dose-optimization program which includes automated exposure control, adjustment of the mA and/or kV according to patient size and/or use of iterative reconstruction technique. COMPARISON:  Multiple chest radiographs, most recently done 05/27/2023, 05/24/2023, 03/14/2023 and 02/28/2023. Chest CTA 09/28/2020. FINDINGS: Cardiovascular: Atherosclerosis of the aorta, great vessels and coronary arteries. The patient has undergone previous TAVR procedure. There are prominent calcifications of the mitral annulus. Stable mild cardiac enlargement without significant pericardial fluid. Mediastinum/Nodes: There are no enlarged mediastinal, hilar or axillary lymph nodes.Scattered prominent mediastinal lymph nodes, similar to previous study, likely reactive. There is a small hiatal hernia. The thyroid  gland and trachea appear unremarkable. Lungs/Pleura: Small left greater than  right pleural effusions. No pneumothorax. As seen on recent radiographs, there are diffuse multifocal patchy airspace and ground-glass opacities throughout both lungs which are new compared with the previous CT and radiographs prior to this month. Upper abdomen: No acute or significant findings are seen in the visualized upper abdomen. Musculoskeletal/Chest wall: There is no chest wall mass or suspicious osseous finding. Mild scoliosis with multilevel spondylosis. IMPRESSION: 1. Diffuse multifocal patchy airspace and ground-glass opacities throughout both lungs, new compared with previous CT and radiographs prior to this month. Findings are most consistent with multifocal pneumonia. Differential includes non infectious inflammation (including drug reaction) and atypical edema. Chest radiographic follow up recommended. 2. Small left  greater than right pleural effusions. 3. Stable mild cardiac enlargement status post TAVR procedure. 4. Aortic atherosclerosis. Electronically Signed   By: Elmon Hagedorn M.D.   On: 05/28/2023 14:18   DG CHEST PORT 1 VIEW Result Date: 05/27/2023 CLINICAL DATA:  83 year old female with shortness of breath. EXAM: PORTABLE CHEST 1 VIEW COMPARISON:  Portable chest 05/24/2023 and earlier. FINDINGS: Portable AP view at 0816 hours. Stable cardiomegaly and mediastinal contours. Previous TAVR. Visualized tracheal air column is within normal limits. Widespread coarse bilateral pulmonary opacity, asymmetrically greater in the left upper and lower lungs, not significantly changed from 05/24/2023, mildly progressed since 05/20/2023, substantially new since 05/14/2023. No pneumothorax or pleural effusion. Negative visible bowel gas. Stable visualized osseous structures. IMPRESSION: Coarse and asymmetric bilateral pulmonary opacity greater on the left not significantly changed from 05/24/2023. Favor bilateral infection rather than asymmetric edema. No new cardiopulmonary abnormality. Electronically  Signed   By: Marlise Simpers M.D.   On: 05/27/2023 12:07   DG Chest 1 View Result Date: 05/24/2023 CLINICAL DATA:  Follow-up. EXAM: CHEST  1 VIEW COMPARISON:  Chest x-ray 05/20/2023 FINDINGS: The heart is enlarged. Stent is seen in the region of the ascending aorta. Diffuse central interstitial markings are again noted. Patchy multifocal airspace a nodular airspace opacities have mildly decreased. Costophrenic angles are clear. There is no pneumothorax. IMPRESSION: 1. Mild decrease in patchy multifocal airspace a nodular airspace opacities. 2. Cardiomegaly. Electronically Signed   By: Tyron Gallon M.D.   On: 05/24/2023 18:46   DG Chest Port 1 View Result Date: 05/20/2023 CLINICAL DATA:  Shortness of breath, respiratory distress EXAM: PORTABLE CHEST 1 VIEW COMPARISON:  05/14/2023 FINDINGS: Patchy bilateral airspace opacities have worsened since prior study. Cardiomegaly. No effusions or pneumothorax. No acute bony abnormality. IMPRESSION: Cardiomegaly. Patchy bilateral airspace disease concerning for multifocal pneumonia. Electronically Signed   By: Janeece Mechanic M.D.   On: 05/20/2023 03:16   ECHOCARDIOGRAM COMPLETE Result Date: 05/15/2023    ECHOCARDIOGRAM REPORT   Patient Name:   Mary Mccarthy Date of Exam: 05/15/2023 Medical Rec #:  [id number]                Height:       61.0 in Accession #:    [id number]               Weight:       194.9 lb Date of Birth:  12/20/1940                BSA:          1.868 m Patient Age:    82 years                 BP:           155/126 mmHg Patient Gender: F                        HR:           61 bpm. Exam Location:  Inpatient Procedure: 2D Echo, Cardiac Doppler and Color Doppler (Both Spectral and Color            Flow Doppler were utilized during procedure). Indications:    I50.40* Unspecified combined systolic (congestive) and diastolic                 (congestive) heart failure  History:        Patient has prior history of Echocardiogram examinations, most  recent 11/13/2022. CHF, CAD, Stroke, Aortic Valve Disease; Risk                 Factors:Hypertension, Dyslipidemia and Diabetes. TAVR procedure.                 Aortic Valve: 26 mm Medtronic stented (TAVR) valve is present in                 the aortic position. Procedure Date: 10/26/2020.  Sonographer:    Raynelle Callow RDCS Referring Phys: [id number] RONDELL A SMITH  Sonographer Comments: Study delayed, patient in chair IMPRESSIONS  1. Left ventricular ejection fraction, by estimation, is 60 to 65%. The left ventricle has normal function. The left ventricle has no regional wall motion abnormalities. There is severe asymmetric left ventricular hypertrophy of the septal segment. Left  ventricular diastolic function could not be evaluated.  2. Right ventricular systolic function is normal. The right ventricular size is mildly enlarged. There is moderately elevated pulmonary artery systolic pressure. The estimated right ventricular systolic pressure is 53.7 mmHg.  3. Left atrial size was mildly dilated.  4. Right atrial size was mildly dilated.  5. The mitral valve is degenerative. Trivial mitral valve regurgitation. Mild mitral stenosis. The mean mitral valve gradient is 3.0 mmHg. Severe mitral annular calcification.  6. Tricuspid valve regurgitation is mild to moderate.  7. Mild paravalvular leak adjacent to the intervalvular fibrosa. The aortic valve has been repaired/replaced. Aortic valve regurgitation is mild. No aortic stenosis is present. There is a 26 mm Medtronic stented (TAVR) valve present in the aortic position. Procedure Date: 10/26/2020. Echo findings are consistent with normal structure and function of the aortic valve prosthesis. Echo findings are consistent with perivalvular leak of the aortic prosthesis. Aortic regurgitation PHT measures 514 msec.  Aortic valve mean gradient measures 9.0 mmHg. Aortic valve Vmax measures 1.88 m/s.  8. The inferior vena cava is dilated in size with <50% respiratory  variability, suggesting right atrial pressure of 15 mmHg. Comparison(s): Prior images reviewed side by side. PVL is more prominent, stable RVSP. FINDINGS  Left Ventricle: Left ventricular ejection fraction, by estimation, is 60 to 65%. The left ventricle has normal function. The left ventricle has no regional wall motion abnormalities. Strain was performed and the global longitudinal strain is indeterminate. The left ventricular internal cavity size was normal in size. There is severe asymmetric left ventricular hypertrophy of the septal segment. Left ventricular diastolic function could not be evaluated due to mitral annular calcification (moderate or greater). Left ventricular diastolic function could not be evaluated. Right Ventricle: The right ventricular size is mildly enlarged. No increase in right ventricular wall thickness. Right ventricular systolic function is normal. There is moderately elevated pulmonary artery systolic pressure. The tricuspid regurgitant velocity is 3.11 m/s, and with an assumed right atrial pressure of 15 mmHg, the estimated right ventricular systolic pressure is 53.7 mmHg. Left Atrium: Left atrial size was mildly dilated. Right Atrium: Right atrial size was mildly dilated. Pericardium: There is no evidence of pericardial effusion. Mitral Valve: The mitral valve is degenerative in appearance. Severe mitral annular calcification. Trivial mitral valve regurgitation. Mild mitral valve stenosis. MV peak gradient, 11.8 mmHg. The mean mitral valve gradient is 3.0 mmHg with average heart rate of 59 bpm. Tricuspid Valve: The tricuspid valve is normal in structure. Tricuspid valve regurgitation is mild to moderate. No evidence of tricuspid stenosis. Aortic Valve: Mild paravalvular leak adjacent to the intervalvular fibrosa. The aortic valve has been repaired/replaced. Aortic valve  regurgitation is mild. Aortic regurgitation PHT measures 514 msec. No aortic stenosis is present. Aortic valve mean  gradient measures 9.0 mmHg. Aortic valve peak gradient measures 14.1 mmHg. Aortic valve area, by VTI measures 1.92 cm. There is a 26 mm Medtronic stented (TAVR) valve present in the aortic position. Procedure Date: 10/26/2020. Echo findings are consistent with normal structure and function of the aortic valve prosthesis. Pulmonic Valve: The pulmonic valve was normal in structure. Pulmonic valve regurgitation is not visualized. No evidence of pulmonic stenosis. Aorta: The aortic root and ascending aorta are structurally normal, with no evidence of dilitation. Venous: The inferior vena cava is dilated in size with less than 50% respiratory variability, suggesting right atrial pressure of 15 mmHg. IAS/Shunts: The atrial septum is grossly normal. Additional Comments: 3D was performed not requiring image post processing on an independent workstation and was indeterminate.  LEFT VENTRICLE PLAX 2D LVIDd:         4.60 cm     Diastology LVIDs:         2.90 cm     LV e' medial:    5.55 cm/s LV PW:         1.10 cm     LV E/e' medial:  25.9 LV IVS:        1.50 cm     LV e' lateral:   6.64 cm/s LVOT diam:     2.00 cm     LV E/e' lateral: 21.7 LV SV:         81 LV SV Index:   43 LVOT Area:     3.14 cm  LV Volumes (MOD) LV vol d, MOD A2C: 84.9 ml LV vol d, MOD A4C: 68.3 ml LV vol s, MOD A2C: 27.4 ml LV vol s, MOD A4C: 31.5 ml LV SV MOD A2C:     57.5 ml LV SV MOD A4C:     68.3 ml LV SV MOD BP:      46.8 ml RIGHT VENTRICLE             IVC RV S prime:     10.90 cm/s  IVC diam: 2.70 cm TAPSE (M-mode): 2.2 cm LEFT ATRIUM             Index        RIGHT ATRIUM           Index LA diam:        5.20 cm 2.78 cm/m   RA Area:     21.80 cm LA Vol (A2C):   54.6 ml 29.23 ml/m  RA Volume:   64.90 ml  34.74 ml/m LA Vol (A4C):   62.4 ml 33.40 ml/m LA Biplane Vol: 59.4 ml 31.80 ml/m  AORTIC VALVE AV Area (Vmax):    1.93 cm AV Area (Vmean):   1.83 cm AV Area (VTI):     1.92 cm AV Vmax:           187.67 cm/s AV Vmean:          122.000 cm/s AV  VTI:            0.420 m AV Peak Grad:      14.1 mmHg AV Mean Grad:      9.0 mmHg LVOT Vmax:         115.00 cm/s LVOT Vmean:        71.000 cm/s LVOT VTI:          0.257 m LVOT/AV VTI ratio: 0.61 AI PHT:  514 msec  AORTA Ao Root diam: 3.30 cm Ao Asc diam:  3.25 cm MITRAL VALVE                TRICUSPID VALVE MV Area (PHT): 4.60 cm     TR Peak grad:   38.7 mmHg MV Area VTI:   1.66 cm     TR Vmax:        311.00 cm/s MV Peak grad:  11.8 mmHg MV Mean grad:  3.0 mmHg     SHUNTS MV Vmax:       1.72 m/s     Systemic VTI:  0.26 m MV Vmean:      74.7 cm/s    Systemic Diam: 2.00 cm MV Decel Time: 165 msec MV E velocity: 144.00 cm/s MV A velocity: 71.80 cm/s MV E/A ratio:  2.01 Gloriann Larger MD Electronically signed by Gloriann Larger MD Signature Date/Time: 05/15/2023/5:44:08 PM    Final    DG Chest Portable 1 View Result Date: 05/14/2023 CLINICAL DATA:  Shortness of breath.  Cough. EXAM: PORTABLE CHEST 1 VIEW COMPARISON:  03/14/2023. FINDINGS: Low lung volume. Mild-to-moderate pulmonary vascular congestion with hilar predominance. Bilateral lung fields are otherwise clear. No dense consolidation or lung collapse. Bilateral costophrenic angles are clear. Moderately enlarged cardio-mediastinal silhouette, which is likely accentuated by low lung volume and AP technique. No acute osseous abnormalities. A well-defined ossific density along the superior and lateral aspect of the right humeral head is nonspecific but is most commonly due to calcific tendinopathy. The soft tissues are within normal limits. IMPRESSION: Mild-to-moderate pulmonary vascular congestion with hilar predominance. No dense consolidation or lung collapse. Electronically Signed   By: Beula Brunswick M.D.   On: 05/14/2023 10:03    Microbiology: No results found for this or any previous visit (from the past 240 hours).   Labs: Basic Metabolic Panel: Recent Labs  Lab 06/03/23 0235 06/04/23 0307 06/05/23 0253 06/06/23 0248  06/07/23 0220  NA 134* 135 137 136 136  K 4.4 4.6 4.2 4.7 4.2  CL 99 101 102 102 102  CO2 26 26 26 29 27   GLUCOSE 146* 123* 158* 148* 125*  BUN 57* 50* 49* 48* 54*  CREATININE 1.57* 1.75* 1.62* 1.86* 1.97*  CALCIUM  8.6* 8.5* 8.3* 8.2* 8.5*  MG  --   --  2.1 2.0 2.0  PHOS  --   --  2.5  --   --    Liver Function Tests: No results for input(s): "AST", "ALT", "ALKPHOS", "BILITOT", "PROT", "ALBUMIN" in the last 168 hours. No results for input(s): "LIPASE", "AMYLASE" in the last 168 hours. No results for input(s): "AMMONIA" in the last 168 hours. CBC: Recent Labs  Lab 06/01/23 0303 06/02/23 0316 06/05/23 0253  WBC 9.0 8.7 7.7  HGB 7.9* 7.8* 8.8*  HCT 25.2* 25.1* 27.2*  MCV 93.3 93.3 93.5  PLT 201 225 252   Cardiac Enzymes: No results for input(s): "CKTOTAL", "CKMB", "CKMBINDEX", "TROPONINI" in the last 168 hours. BNP: BNP (last 3 results) Recent Labs    05/20/23 0305 05/29/23 0449 06/03/23 1217  BNP 834.1* 591.7* 322.8*    ProBNP (last 3 results) Recent Labs    03/14/23 0924  PROBNP 235.0*    CBG: Recent Labs  Lab 06/06/23 1023 06/06/23 1253 06/06/23 1722 06/06/23 2112 06/07/23 0621  GLUCAP 107* 196* 172* 215* 127*       Signed:  Deforest Fast MD.  [facility] 06/07/2023, 10:25 AM

## 2023-06-07 NOTE — Progress Notes (Signed)
 [facility] Liaison Note: Notified by [facility] manager of patient/family request for [facility] services at home after discharge. Please call with any hospice or outpatient palliative care related questions. Thank you for the opportunity to participate in this patient's care. Henderson Newcomer, LPN [facility] Liaison [phone]

## 2023-06-07 NOTE — Progress Notes (Signed)
 Physical Therapy Treatment Patient Details Name: Mary Mccarthy MRN: [id number] DOB: 1940-02-25 Today's Date: 06/07/2023   History of Present Illness Patient is a 83 year old female with acute on chronic respiratory failure with hypoxia secondary to acute on chronic diastolic CHF. Pt found to have aspiration PNA. History of HTN, hyperlipidemia, respiratory failure on 2 L, PAF, GI bleed, aortic stenosis s/p TAVR,  transient heart block, diabetes mellitus type II, CAD, hypothyroidism, GERD    PT Comments  Pt continues to require increased supplemental oxygen  compared to baseline. She has been weaned to 3L at rest and requires a minimum of 6L for functional mobility. Pt desaturates with activity. As she fatigues the drop of SpO2 becomes more significant and the time to recover or level of oxygen  required to be delivered increases. RN and MD notified of pt's cardiopulmonary endurance and response to activity. Educated pt on energy conservation techniques including pacing of activities. Instructed pt to monitor SpO2 at home with the use of pulse oximeter, which she already owns. Pt engaged in five short bouts of ambulation going ~10-39ft within her room using rollator with CGA. She completed standing marches inside RW to simulate stairs as she has at least one step to enter her home. Pt is highly motivated to return home. Will continue to follow acutely and advance appropriately.     If plan is discharge home, recommend the following: A lot of help with walking and/or transfers;A lot of help with bathing/dressing/bathroom;Assistance with cooking/housework;Assist for transportation;Help with stairs or ramp for entrance   Can travel by private vehicle     Yes  Equipment Recommendations  Wheelchair (measurements PT);Wheelchair cushion (measurements PT)    Recommendations for Other Services       Precautions / Restrictions Precautions Precautions: Fall Recall of Precautions/Restrictions:  Intact Precaution/Restrictions Comments: watch SpO2 Restrictions Weight Bearing Restrictions Per Provider Order: No     Mobility  Bed Mobility Overal bed mobility: Needs Assistance Bed Mobility: Supine to Sit     Supine to sit: Supervision, HOB elevated, Used rails, Contact guard     General bed mobility comments: Pt sat up on L side of bed with HOB elevated and heavy reliance on bedrail. Assist to lift trunk upright. Pt took increased time to reach EOB with feet flat on floor.    Transfers Overall transfer level: Needs assistance Equipment used: Rollator (4 wheels) Transfers: Sit to/from Stand Sit to Stand: Supervision, Contact guard assist           General transfer comment: Pt stood from lowest bed height and recliner chair by pushing up with BUE support without physical assist. She stood from standard chair in room that didn't have handrails and required CGA to boost into standing. VC for proper sequencing of locking/unlocking breaks and hand positioning using RW. Good eccentric control with sitting.    Ambulation/Gait Ambulation/Gait assistance: Contact guard assist Gait Distance (Feet): 20 Feet (1x15, seated rest, 1x20, seated rest, 1x10, seated rest, 1x10, seated rest, 1x20) Assistive device: Rollator (4 wheels) Gait Pattern/deviations: Step-to pattern, Decreased step length - right, Decreased step length - left, Decreased dorsiflexion - right, Decreased dorsiflexion - left, Wide base of support, Drifts right/left Gait velocity: decreased Gait velocity interpretation: <1.31 ft/sec, indicative of household ambulator   General Gait Details: Pt ambulated with short slow steps. She maintained a WBOS and appeared to waddle when advancing each leg. She maintained a fwd lean, but kept body inside RW at all times. Pt monitored SpO2 and recognized when  she should take rest breaks based on the reading. Pt experienced SOB/DOE. As she fatigued, it was more difficulty and laborious  to walk. Pt reports only have short-distances in her house. Educated pt on energy conservation techniques.   Stairs Stairs: Yes Stairs assistance: Contact guard assist Stair Management: With walker Number of Stairs: 5 General stair comments: Pt performed standing marches inside RW with BUE support and CGA for safety. She was able to achieve adequate hip/knee flex to clear an average step. Advised pt to have her son behind her when entering the house and infront of her when leaving the house.   Wheelchair Mobility     Tilt Bed    Modified Rankin (Stroke Patients Only)       Balance Overall balance assessment: Needs assistance Sitting-balance support: Feet supported, No upper extremity supported Sitting balance-[name]: Fair     Standing balance support: Bilateral upper extremity supported, During functional activity, Reliant on assistive device for balance Standing balance-[name]: Poor Standing balance comment: Pt dependent on rollator                            Communication Communication Communication: Impaired Factors Affecting Communication: Hearing impaired  Cognition Arousal: Alert Behavior During Therapy: WFL for tasks assessed/performed   PT - Cognitive impairments: No apparent impairments                         Following commands: Intact      Cueing Cueing Techniques: Verbal cues  Exercises      General Comments General comments (skin integrity, edema, etc.): Pt greeted on 3L O2 at rest. Ambulated pt on 3L and she desaturated to 83% SpO2. Took a seated, bumped up to 4L with recover to 86%, bumped up to 6L and pt recovered to >88% within ~20sec. Ambulated pt on 4L and she desaturated to 78% SpO2. Took a seated, bumped up to 6L with recover to 88%, within ~20sec. Ambulated pt on 6L and desaturated to 64-69% SpO2. Took a seated, bumped up to 8L with recover to 88%, within ~20sec. Ambulated pt on 8L and maintained SpO2 90-94%. As pt  fatigued her desaturation was more significant and she took longer to recover or required a higher O2 rate to recover. Pt reports she has a pulse oximeter to monitor her O2 status at home. Educated pt on energy conservation technique and pacing of activites.      Pertinent Vitals/Pain Pain Assessment Pain Assessment: No/denies pain    Home Living                          Prior Function            PT Goals (current goals can now be found in the care plan section) Acute Rehab PT Goals Patient Stated Goal: Return home Progress towards PT goals: Progressing toward goals    Frequency    Min 2X/week      PT Plan      Co-evaluation              AM-PAC PT "6 Clicks" Mobility   Outcome Measure  Help needed turning from your back to your side while in a flat bed without using bedrails?: A Little Help needed moving from lying on your back to sitting on the side of a flat bed without using bedrails?: A Little Help needed moving to  and from a bed to a chair (including a wheelchair)?: A Little Help needed standing up from a chair using your arms (e.g., wheelchair or bedside chair)?: A Little Help needed to walk in hospital room?: A Little Help needed climbing 3-5 steps with a railing? : A Lot 6 Click Score: 17    End of Session Equipment Utilized During Treatment: Gait belt;Oxygen  Activity Tolerance: Patient tolerated treatment well;Patient limited by fatigue Patient left: in chair;with call bell/phone within reach Nurse Communication: Mobility status;Other (comment) (RN and MD aware of pt's performance with mobility including desaturation and oxygen  needs) PT Visit Diagnosis: Other abnormalities of gait and mobility (R26.89);Muscle weakness (generalized) (M62.81);Difficulty in walking, not elsewhere classified (R26.2)     Time: 1610-9604 PT Time Calculation (min) (ACUTE ONLY): 41 min  Charges:    $Gait Training: 23-37 mins $Therapeutic Activity: 8-22  mins PT General Charges $$ ACUTE PT VISIT: 1 Visit                     Glenford Lanes, PT, DPT Acute Rehabilitation Services Office: [phone] Secure Chat Preferred  Riva Chester 06/07/2023, 11:07 AM

## 2023-06-08 ENCOUNTER — Telehealth: Payer: Self-pay

## 2023-06-08 ENCOUNTER — Telehealth: Payer: Self-pay | Admitting: Internal Medicine

## 2023-06-08 ENCOUNTER — Encounter (HOSPITAL_COMMUNITY)

## 2023-06-08 DIAGNOSIS — K219 Gastro-esophageal reflux disease without esophagitis: Secondary | ICD-10-CM | POA: Diagnosis not present

## 2023-06-08 DIAGNOSIS — E1165 Type 2 diabetes mellitus with hyperglycemia: Secondary | ICD-10-CM | POA: Diagnosis not present

## 2023-06-08 DIAGNOSIS — E039 Hypothyroidism, unspecified: Secondary | ICD-10-CM | POA: Diagnosis not present

## 2023-06-08 DIAGNOSIS — I272 Pulmonary hypertension, unspecified: Secondary | ICD-10-CM | POA: Diagnosis not present

## 2023-06-08 DIAGNOSIS — N184 Chronic kidney disease, stage 4 (severe): Secondary | ICD-10-CM | POA: Diagnosis not present

## 2023-06-08 DIAGNOSIS — D509 Iron deficiency anemia, unspecified: Secondary | ICD-10-CM | POA: Diagnosis not present

## 2023-06-08 DIAGNOSIS — E538 Deficiency of other specified B group vitamins: Secondary | ICD-10-CM | POA: Diagnosis not present

## 2023-06-08 DIAGNOSIS — I4819 Other persistent atrial fibrillation: Secondary | ICD-10-CM | POA: Diagnosis not present

## 2023-06-08 DIAGNOSIS — J9601 Acute respiratory failure with hypoxia: Secondary | ICD-10-CM | POA: Diagnosis not present

## 2023-06-08 DIAGNOSIS — E7849 Other hyperlipidemia: Secondary | ICD-10-CM | POA: Diagnosis not present

## 2023-06-08 DIAGNOSIS — E1122 Type 2 diabetes mellitus with diabetic chronic kidney disease: Secondary | ICD-10-CM | POA: Diagnosis not present

## 2023-06-08 DIAGNOSIS — N179 Acute kidney failure, unspecified: Secondary | ICD-10-CM | POA: Diagnosis not present

## 2023-06-08 DIAGNOSIS — I700 Atherosclerosis of aorta: Secondary | ICD-10-CM | POA: Diagnosis not present

## 2023-06-08 DIAGNOSIS — I5033 Acute on chronic diastolic (congestive) heart failure: Secondary | ICD-10-CM | POA: Diagnosis not present

## 2023-06-08 DIAGNOSIS — J690 Pneumonitis due to inhalation of food and vomit: Secondary | ICD-10-CM | POA: Diagnosis not present

## 2023-06-08 DIAGNOSIS — I2510 Atherosclerotic heart disease of native coronary artery without angina pectoris: Secondary | ICD-10-CM | POA: Diagnosis not present

## 2023-06-08 DIAGNOSIS — I160 Hypertensive urgency: Secondary | ICD-10-CM | POA: Diagnosis not present

## 2023-06-08 DIAGNOSIS — I130 Hypertensive heart and chronic kidney disease with heart failure and stage 1 through stage 4 chronic kidney disease, or unspecified chronic kidney disease: Secondary | ICD-10-CM | POA: Diagnosis not present

## 2023-06-08 DIAGNOSIS — E1169 Type 2 diabetes mellitus with other specified complication: Secondary | ICD-10-CM | POA: Diagnosis not present

## 2023-06-08 DIAGNOSIS — M810 Age-related osteoporosis without current pathological fracture: Secondary | ICD-10-CM | POA: Diagnosis not present

## 2023-06-08 NOTE — Patient Instructions (Signed)
 Visit Information  Thank you for taking time to visit with me today. Please don't hesitate to contact me if I can be of assistance to you before our next scheduled telephone appointment.  Your next appointment is by telephone on 06/15/23 at 1000 am with Laine Tousey, RN  Following is a copy of your care plan:   Goals Addressed             This Visit's Progress    VBCI Transitions of Care (TOC) Care Plan       Problems:  Recent Hospitalization for treatment of CHF and Respiratory Failure No Hospital Follow Up Provider appointment Patient to call PCP for follow up  Goal:  Over the next 30 days, the patient will not experience hospital readmission  Interventions:  Transitions of Care: Doctor Visits  - discussed the importance of doctor visits Contacted Palliative -[facility]- will be reaching out to patient Oxygen  use- keeping oxygen  levels above 80. Encouraged patient to pace self with activity. Reviewed importance of medication compliance. Reviewed when to call for physician for increased shortness of breath, increased sputum, fatigue that is not his normal, cough, fever or sick contacts.   Heart Failure Interventions: Basic overview and discussion of pathophysiology of Heart Failure reviewed Provided education on low sodium diet Advised patient to weigh each morning after emptying bladder Discussed importance of daily weight and advised patient to weigh and record daily Reviewed role of diuretics in prevention of fluid overload and management of heart failure; Discussed the importance of keeping all appointments with provider  Patient Self Care Activities:  Attend all scheduled provider appointments Call provider office for new concerns or questions  Notify RN Care Manager of TOC call rescheduling needs Take medications as prescribed   call office if I gain more than 2 pounds in one day or 5 pounds in one week track weight in diary watch for swelling in feet, ankles and  legs every day weigh myself daily follow rescue plan if symptoms flare-up  Plan:  Telephone follow up appointment with care management team member scheduled for:  06/15/23 @ 1000 am        Patient verbalizes understanding of instructions and care plan provided today and agrees to view in MyChart. Active MyChart status and patient understanding of how to access instructions and care plan via MyChart confirmed with patient.     The patient has been provided with contact information for the care management team and has been advised to call with any health related questions or concerns.   Please call the care guide team at [phone] if you need to cancel or reschedule your appointment.   Please call the Suicide and Crisis Lifeline: 988 if you are experiencing a Mental Health or Behavioral Health Crisis or need someone to talk to.  Kruze Atchley J. Tinesha Siegrist RN, MSN [facility], [facility] RN Care Manager Direct Dial: [phone]  Fax: [phone] Website: [email or web address]

## 2023-06-08 NOTE — Telephone Encounter (Signed)
 Copied from CRM [id number]. Topic: General - Other >> Jun 08, 2023 10:38 AM Albertha Alosa wrote: Reason for CRM: Tonya from [facility] stated she received from the hospital an order that they  will be following patient  for   PALLIATIVE CARE  Any questions to contact [phone]

## 2023-06-08 NOTE — Telephone Encounter (Unsigned)
 Copied from CRM [id number]. Topic: Clinical - Home Health Verbal Orders >> Jun 08, 2023  1:48 PM Varney Gentleman wrote: Caller/Agency: [facility] Callback Number: [phone] Secure Line Service Requested: Home Health Care, Congested heart failure diabetes and medication management Frequency: 1 week 5, 1 month 1 and 2 PRN's Any new concerns about the patient? No, just still weak and teaching patient how to take blood pressure and finger stick has caregiver in home

## 2023-06-08 NOTE — Telephone Encounter (Signed)
 Ok for [employer]

## 2023-06-08 NOTE — Transitions of Care (Post Inpatient/ED Visit) (Signed)
 06/08/2023  Name: Mary Mccarthy MRN: [id number] DOB: 10/25/1940  Today's TOC FU Call Status: Today's TOC FU Call Status:: Successful TOC FU Call Completed TOC FU Call Complete Date: 06/08/23 Patient's Name and Date of Birth confirmed.  Transition Care Management Follow-up Telephone Call Date of Discharge: 06/07/23 Discharge Facility: [facility]) Type of Discharge: Inpatient Admission Primary Inpatient Discharge Diagnosis:: Acute hypoxic respiratory failure How have you been since you were released from the hospital?: Better Any questions or concerns?: No  Items Reviewed: Did you receive and understand the discharge instructions provided?: Yes Medications obtained,verified, and reconciled?: Yes (Medications Reviewed) Any new allergies since your discharge?: No Dietary orders reviewed?: Yes Type of Diet Ordered:: Low salt heart Healthy Carb modified Do you have support at home?: No  Medications Reviewed Today: Medications Reviewed Today     Reviewed by Angelly Spearing, RN (Case Manager) on 06/08/23 at [phone]  Med List Status: <None>   Medication Order Taking? Sig Documenting Provider Last Dose Status Informant  acetaminophen  (TYLENOL ) 500 MG tablet [id number] Yes Take 500 mg by mouth every 6 (six) hours as needed for moderate pain. [provider] Taking Active Self, Child, Pharmacy Records           Med Note Mliss Anderson [place], [name]   Mon May 14, 2023  2:52 PM)    ALPRAZolam  (XANAX ) 0.25 MG tablet [id number] Yes Take 1 tablet (0.25 mg total) by mouth 3 (three) times daily as needed for anxiety. Roslyn Coombe, MD Taking Active Self, Child, Pharmacy Records  amLODipine  (NORVASC ) 5 MG tablet [id number] Yes Take 1 tablet (5 mg total) by mouth daily. Deforest Fast, MD Taking Active   atorvastatin  (LIPITOR ) 40 MG tablet [id number] Yes TAKE 1 TABLET BY MOUTH EVERY DAY Arleen Lacer, MD Taking Active Self, Child, Pharmacy Records  blood glucose meter kit and  supplies [id number] Yes Dispense based on patient and insurance preference. Use up to four times daily as directed. E11.9 Roslyn Coombe, MD Taking Active Self, Child, Pharmacy Records  Blood Glucose Monitoring Suppl (ONE TOUCH ULTRA 2) w/Device Suzanne Erps [id number] Yes Use as directed daily Roslyn Coombe, MD Taking Active Self, Child, Pharmacy Records  [facility] VITAMIN B12 1000 MCG tablet [id number] Yes TAKE 1 TABLET BY MOUTH EVERY DAY Roslyn Coombe, MD Taking Active Self, Child, Pharmacy Records  hydrALAZINE  (APRESOLINE ) 50 MG tablet [id number] Yes Take 1 tablet (50 mg total) by mouth 3 (three) times daily. Deforest Fast, MD Taking Active   insulin  glargine (LANTUS ) 100 UNIT/ML Solostar Pen [id number] Yes Inject 40 Units into the skin daily. Deforest Fast, MD Taking Active   Insulin  Pen Needle 32G X 4 MM MISC [id number] Yes Use with Lantus  pen Deforest Fast, MD Taking Active   isosorbide  mononitrate (IMDUR ) 30 MG 24 hr tablet [id number] Yes Take 2 tablets (60 mg total) by mouth daily. Deforest Fast, MD Taking Active   Lancets [facility] Jewelene Morton PLUS [place]) MISC [id number] Yes USE AS DIRECTED TEST 1-2 TIMES A DAY Roslyn Coombe, MD Taking Active Self, Child, Pharmacy Records  levothyroxine  (SYNTHROID ) 25 MCG tablet [id number] Yes TAKE 1 TABLET BY MOUTH EVERY DAY Roslyn Coombe, MD Taking Active Self, Child, Pharmacy Records  metFORMIN  (GLUCOPHAGE ) 1000 MG tablet [id number] Yes TAKE 1 TABLET BY MOUTH IN THE AM AND 1/2 IN THE PM Roslyn Coombe, MD Taking Active Self, Child, Pharmacy Records  metoprolol  succinate (TOPROL -XL) 25 MG 24 hr tablet [id number] Yes TAKE 1/2 TABLET BY MOUTH DAILY Rosalia Colonel  W, MD Taking Active Self, Child, Pharmacy Records  mirtazapine  (REMERON ) 15 MG tablet [id number] Yes TAKE 1 TABLET BY MOUTH EVERYDAY AT BEDTIME Roslyn Coombe, MD Taking Active Self, Child, Pharmacy Records           Med Note [facility] [place], [name]   Mon May 14, 2023  3:12 PM) Pt adamant of taking medication. Dispense  report does not support claim.  ONETOUCH ULTRA test strip [id number] Yes USE AS INSTRUCTED TEST 1-2 TIMES A DAY Roslyn Coombe, MD Taking Active Self, Child, Pharmacy Records  pantoprazole  (PROTONIX ) 40 MG tablet [id number] Yes TAKE 1 TABLET BY MOUTH TWICE A DAY Ardia Kraft, PA-C Taking Active Self, Child, Pharmacy Records  predniSONE  (DELTASONE ) 20 MG tablet [id number] Yes Take 2 tablets (40 mg total) by mouth daily with breakfast. Deforest Fast, MD Taking Active   solifenacin  (VESICARE ) 5 MG tablet [id number] Yes Take 1 tablet (5 mg total) by mouth daily. Roslyn Coombe, MD Taking Active Self, Child, Pharmacy Records  torsemide  (DEMADEX ) 20 MG tablet [id number] Yes Take 2 tablets (40 mg total) by mouth daily. Deforest Fast, MD Taking Active             Home Care and Equipment/Supplies: Were Home Health Services Ordered?: Yes Name of Home Health Agency:: [facility] Has Agency set up a time to come to your home?: Yes First Home Health Visit Date: 06/08/23 Any new equipment or medical supplies ordered?: Yes Name of Medical supply agency?: [employer] Were you able to get the equipment/medical supplies?: Yes Do you have any questions related to the use of the equipment/supplies?: No  Functional Questionnaire: Do you need assistance with bathing/showering or dressing?: No Do you need assistance with meal preparation?: Yes Do you need assistance with eating?: No Do you have difficulty maintaining continence: No Do you need assistance with getting out of bed/getting out of a chair/moving?: No Do you have difficulty managing or taking your medications?: No  Follow up appointments reviewed: PCP Follow-up appointment confirmed?: No (patient to call) MD Provider Line Number:[phone] Given: No Specialist Hospital Follow-up appointment confirmed?: Yes Date of Specialist follow-up appointment?: 06/20/23 Follow-Up Specialty Provider:: Dr. Waylan Haggard Do you need transportation to your  follow-up appointment?: No Do you understand care options if your condition(s) worsen?: Yes-patient verbalized understanding  SDOH Interventions Today    Flowsheet Row Most Recent Value  SDOH Interventions   Food Insecurity Interventions Intervention Not Indicated  Housing Interventions Intervention Not Indicated  Transportation Interventions Intervention Not Indicated  Utilities Interventions Intervention Not Indicated        Goals Addressed             This Visit's Progress    VBCI Transitions of Care (TOC) Care Plan       Problems:  Recent Hospitalization for treatment of CHF and Respiratory Failure No Hospital Follow Up Provider appointment Patient to call PCP for follow up  Goal:  Over the next 30 days, the patient will not experience hospital readmission  Interventions:  Transitions of Care: Doctor Visits  - discussed the importance of doctor visits Contacted Palliative -[facility]- will be reaching out to patient Oxygen  use- keeping oxygen  levels above 80. Encouraged patient to pace self with activity. Reviewed importance of medication compliance. Reviewed when to call for physician for increased shortness of breath, increased sputum, fatigue that is not his normal, cough, fever or sick contacts.   Heart Failure Interventions: Basic overview and discussion of pathophysiology of Heart Failure reviewed Provided education on  low sodium diet Advised patient to weigh each morning after emptying bladder Discussed importance of daily weight and advised patient to weigh and record daily Reviewed role of diuretics in prevention of fluid overload and management of heart failure; Discussed the importance of keeping all appointments with provider  Patient Self Care Activities:  Attend all scheduled provider appointments Call provider office for new concerns or questions  Notify RN Care Manager of TOC call rescheduling needs Take medications as prescribed   call office  if I gain more than 2 pounds in one day or 5 pounds in one week track weight in diary watch for swelling in feet, ankles and legs every day weigh myself daily follow rescue plan if symptoms flare-up  Plan:  Telephone follow up appointment with care management team member scheduled for:  06/15/23 @ 1000 am        Patient reports feeling better since discharge.  She feels she is at her baseline with her breathing.  Son is in the home and assists patient as needed.  She feels she is fairly independent despite her shortness of breath. Dependent with oxygen  @ 3 liters presently. Home health to visit today at 1:00 pm per patient.  Palliative care also referred and will be contacting patient.   Patient to make hospital follow up with PCP.  New pulmonary appointment set for 5/14.     Dorr Perrot J. Staphanie Harbison RN, MSN [facility], [facility] RN Care Manager Direct Dial: [phone]  Fax: [phone] Website: [email or web address]

## 2023-06-11 ENCOUNTER — Other Ambulatory Visit: Payer: Self-pay | Admitting: Internal Medicine

## 2023-06-11 NOTE — Telephone Encounter (Signed)
 Copied from CRM [id number]. Topic: Clinical - Medication Refill >> Jun 11, 2023  3:16 PM Marlan Silva wrote: Most Recent Primary Care Visit:  Provider: Roslyn Coombe  Department: [facility]  Visit Type: OFFICE VISIT  Date: 04/03/2023  Medication: metoprolol  succinate (TOPROL -XL) 25 MG 24 hr tablet  Has the patient contacted their pharmacy? No (Agent: If no, request that the patient contact the pharmacy for the refill. If patient does not wish to contact the pharmacy document the reason why and proceed with request.) (Agent: If yes, when and what did the pharmacy advise?)  Is this the correct pharmacy for this prescription? Yes If no, delete pharmacy and type the correct one.  This is the patient's preferred pharmacy:  [id number] - [place], [place] - [address] AT [address] [place] [address] Phone: [phone] Fax: [phone]   Has the prescription been filled recently? Yes  Is the patient out of the medication? Yes  Has the patient been seen for an appointment in the last year OR does the patient have an upcoming appointment? Yes  Can we respond through MyChart? Yes  Agent: Please be advised that Rx refills may take up to 3 business days. We ask that you follow-up with your pharmacy.

## 2023-06-11 NOTE — Telephone Encounter (Signed)
 Called and gave verbals.

## 2023-06-12 ENCOUNTER — Telehealth: Payer: Self-pay | Admitting: Internal Medicine

## 2023-06-12 ENCOUNTER — Other Ambulatory Visit: Payer: Self-pay

## 2023-06-12 DIAGNOSIS — E1122 Type 2 diabetes mellitus with diabetic chronic kidney disease: Secondary | ICD-10-CM | POA: Diagnosis not present

## 2023-06-12 DIAGNOSIS — N184 Chronic kidney disease, stage 4 (severe): Secondary | ICD-10-CM | POA: Diagnosis not present

## 2023-06-12 DIAGNOSIS — E538 Deficiency of other specified B group vitamins: Secondary | ICD-10-CM | POA: Diagnosis not present

## 2023-06-12 DIAGNOSIS — M810 Age-related osteoporosis without current pathological fracture: Secondary | ICD-10-CM | POA: Diagnosis not present

## 2023-06-12 DIAGNOSIS — E039 Hypothyroidism, unspecified: Secondary | ICD-10-CM | POA: Diagnosis not present

## 2023-06-12 DIAGNOSIS — J690 Pneumonitis due to inhalation of food and vomit: Secondary | ICD-10-CM | POA: Diagnosis not present

## 2023-06-12 DIAGNOSIS — I700 Atherosclerosis of aorta: Secondary | ICD-10-CM | POA: Diagnosis not present

## 2023-06-12 DIAGNOSIS — K219 Gastro-esophageal reflux disease without esophagitis: Secondary | ICD-10-CM | POA: Diagnosis not present

## 2023-06-12 DIAGNOSIS — J9601 Acute respiratory failure with hypoxia: Secondary | ICD-10-CM | POA: Diagnosis not present

## 2023-06-12 DIAGNOSIS — E1169 Type 2 diabetes mellitus with other specified complication: Secondary | ICD-10-CM | POA: Diagnosis not present

## 2023-06-12 DIAGNOSIS — N179 Acute kidney failure, unspecified: Secondary | ICD-10-CM | POA: Diagnosis not present

## 2023-06-12 DIAGNOSIS — I272 Pulmonary hypertension, unspecified: Secondary | ICD-10-CM | POA: Diagnosis not present

## 2023-06-12 DIAGNOSIS — D509 Iron deficiency anemia, unspecified: Secondary | ICD-10-CM | POA: Diagnosis not present

## 2023-06-12 DIAGNOSIS — I160 Hypertensive urgency: Secondary | ICD-10-CM | POA: Diagnosis not present

## 2023-06-12 DIAGNOSIS — E1165 Type 2 diabetes mellitus with hyperglycemia: Secondary | ICD-10-CM | POA: Diagnosis not present

## 2023-06-12 DIAGNOSIS — E7849 Other hyperlipidemia: Secondary | ICD-10-CM | POA: Diagnosis not present

## 2023-06-12 DIAGNOSIS — I130 Hypertensive heart and chronic kidney disease with heart failure and stage 1 through stage 4 chronic kidney disease, or unspecified chronic kidney disease: Secondary | ICD-10-CM | POA: Diagnosis not present

## 2023-06-12 DIAGNOSIS — I4819 Other persistent atrial fibrillation: Secondary | ICD-10-CM | POA: Diagnosis not present

## 2023-06-12 DIAGNOSIS — I5033 Acute on chronic diastolic (congestive) heart failure: Secondary | ICD-10-CM | POA: Diagnosis not present

## 2023-06-12 DIAGNOSIS — I2510 Atherosclerotic heart disease of native coronary artery without angina pectoris: Secondary | ICD-10-CM | POA: Diagnosis not present

## 2023-06-12 MED ORDER — METOPROLOL SUCCINATE ER 25 MG PO TB24: 12.5000 mg | ORAL_TABLET | Freq: Every day | ORAL | 3 refills | Status: AC

## 2023-06-12 NOTE — Telephone Encounter (Signed)
 Copied from CRM [id number]. Topic: Clinical - Home Health Verbal Orders >> Jun 12, 2023 11:44 AM Orien Bird wrote: Caller/Agency: [facility]  Callback Number: [phone] Service Requested: Occupational Therapy Frequency: 1 TIME A WEEK FOR 8 WEEKS  Any new concerns about the patient? No

## 2023-06-13 NOTE — Telephone Encounter (Signed)
 Ok for [employer]

## 2023-06-14 DIAGNOSIS — E538 Deficiency of other specified B group vitamins: Secondary | ICD-10-CM | POA: Diagnosis not present

## 2023-06-14 DIAGNOSIS — E039 Hypothyroidism, unspecified: Secondary | ICD-10-CM | POA: Diagnosis not present

## 2023-06-14 DIAGNOSIS — J9601 Acute respiratory failure with hypoxia: Secondary | ICD-10-CM | POA: Diagnosis not present

## 2023-06-14 DIAGNOSIS — E7849 Other hyperlipidemia: Secondary | ICD-10-CM | POA: Diagnosis not present

## 2023-06-14 DIAGNOSIS — M810 Age-related osteoporosis without current pathological fracture: Secondary | ICD-10-CM | POA: Diagnosis not present

## 2023-06-14 DIAGNOSIS — I5033 Acute on chronic diastolic (congestive) heart failure: Secondary | ICD-10-CM | POA: Diagnosis not present

## 2023-06-14 DIAGNOSIS — J690 Pneumonitis due to inhalation of food and vomit: Secondary | ICD-10-CM | POA: Diagnosis not present

## 2023-06-14 DIAGNOSIS — K219 Gastro-esophageal reflux disease without esophagitis: Secondary | ICD-10-CM | POA: Diagnosis not present

## 2023-06-14 DIAGNOSIS — E1165 Type 2 diabetes mellitus with hyperglycemia: Secondary | ICD-10-CM | POA: Diagnosis not present

## 2023-06-14 DIAGNOSIS — I2510 Atherosclerotic heart disease of native coronary artery without angina pectoris: Secondary | ICD-10-CM | POA: Diagnosis not present

## 2023-06-14 DIAGNOSIS — I272 Pulmonary hypertension, unspecified: Secondary | ICD-10-CM | POA: Diagnosis not present

## 2023-06-14 DIAGNOSIS — E1169 Type 2 diabetes mellitus with other specified complication: Secondary | ICD-10-CM | POA: Diagnosis not present

## 2023-06-14 DIAGNOSIS — N184 Chronic kidney disease, stage 4 (severe): Secondary | ICD-10-CM | POA: Diagnosis not present

## 2023-06-14 DIAGNOSIS — I700 Atherosclerosis of aorta: Secondary | ICD-10-CM | POA: Diagnosis not present

## 2023-06-14 DIAGNOSIS — N179 Acute kidney failure, unspecified: Secondary | ICD-10-CM | POA: Diagnosis not present

## 2023-06-14 DIAGNOSIS — I4819 Other persistent atrial fibrillation: Secondary | ICD-10-CM | POA: Diagnosis not present

## 2023-06-14 DIAGNOSIS — E1122 Type 2 diabetes mellitus with diabetic chronic kidney disease: Secondary | ICD-10-CM | POA: Diagnosis not present

## 2023-06-14 DIAGNOSIS — I130 Hypertensive heart and chronic kidney disease with heart failure and stage 1 through stage 4 chronic kidney disease, or unspecified chronic kidney disease: Secondary | ICD-10-CM | POA: Diagnosis not present

## 2023-06-14 DIAGNOSIS — D509 Iron deficiency anemia, unspecified: Secondary | ICD-10-CM | POA: Diagnosis not present

## 2023-06-14 DIAGNOSIS — I160 Hypertensive urgency: Secondary | ICD-10-CM | POA: Diagnosis not present

## 2023-06-14 NOTE — Telephone Encounter (Signed)
 Called and gave verbals.

## 2023-06-15 ENCOUNTER — Other Ambulatory Visit: Payer: Self-pay | Admitting: *Deleted

## 2023-06-15 NOTE — Patient Instructions (Signed)
 Visit Information  Thank you for taking time to visit with me today. Please don't hesitate to contact me if I can be of assistance to you before our next scheduled telephone appointment.  Our next appointment is by telephone on Friday 06/22/23 at 10:00 am  Please call the care guide team at [phone] if you need to cancel or reschedule your appointment.   Following are the goals we discussed today:  Patient Self Care Activities:  Attend all scheduled provider appointments Call provider office for new concerns or questions  Take medications as prescribed   call office if I gain more than 2 pounds in one day or 5 pounds in one week track weight in diary watch for swelling in feet, ankles and legs every day weigh myself daily follow rescue plan if symptoms flare-up Use assistive devices as needed to prevent falls Continue using home oxygen  as prescribed Continue pacing activity to avoid episodes of shortness of breath Continue working with the home health team that is involved in your care If you believe your condition is getting worse- contact your care providers (doctors) promptly- reaching out to your doctor early when you have concerns can prevent you from having to go to the hospital Take all recorded weights from home monitoring to upcoming cardiology provider office visit for your doctor to review, and ask your doctor about what you should do if you experience weight gain at home  If you are experiencing a Mental Health or Behavioral Health Crisis or need someone to talk to, please  call the Suicide and Crisis Lifeline: 988 call the [place]  National Suicide Prevention Lifeline: (204)575-2417 or TTY: 618-260-4567 TTY 916-247-5552) to talk to a trained counselor call 1-800-273-TALK (toll free, 24 hour hotline) go to [facility] [address] [place] [phone]) call the [facility] Crisis Line: [phone] call 911   Patient  verbalizes understanding of instructions and care plan provided today and agrees to view in MyChart. Active MyChart status and patient understanding of how to access instructions and care plan via MyChart confirmed with patient.     Melchizedek Espinola Mckinney Chesney Klimaszewski, RN, BSN, Media planner  Transitions of Care  [facility] [phone]: direct office

## 2023-06-15 NOTE — Transitions of Care (Post Inpatient/ED Visit) (Signed)
 Transition of Care week 2/ day # 7  Visit Note  06/15/2023  Name: Mary Mccarthy MRN: [id number]          DOB: [birth year]  Situation: Patient enrolled in [facility] 30-day program. Visit completed with patient and her son/ caregiver Allayne Arabian by telephone.   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 Mild (25-49%) prox RCA & LAD,; Moderate (50-69%) Ostial Small RI & prox LCx.     DEPRESSION 10/08/2006   DIABETES MELLITUS, TYPE II 10/05/2006   GERD 06/24/2007   History of CVA (cerebrovascular accident) 08/10/2020   08/2020- found to be in afib with RVR, started on Eliquis  (shower of emboli from A. fib as complication of severe AS)   HYPERLIPIDEMIA 10/08/2006   HYPERTENSION 10/08/2006   INSOMNIA 09/24/2008   Left knee DJD    Nonsustained paroxysmal ventricular tachycardia (HCC) 10/2018   Post-TAVR Zio Patch: Frequent (206) runs of NSVT - Fastest 5 beats 239 bpm. Longest 9.3 Sec avg 135 bpm. (? possibly Afib w/ aberrancy)   Osteoporosis 03/10/2016   PAF (paroxysmal atrial fibrillation) (HCC)    PAF with RVR: CHA2DS2-VASc 9.  On Eliquis  and amiodarone . 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 Abel Hoe & Dr. Sherene Dilling   Severe aortic stenosis 08/2020   Progression from Mod-Severe AS to Severe AS - noted on Echo 08/2020 -> (mean gradient progressed from 34 to 42 mmHg) this was in setting of new onset A. fib RVR, acute diastolic HF and shower of emboli CVA. ->  Referred for TAVR, completed 10/26/2020   Assessment:  "I am feeling so much better now that I have been home for several days.  Doing everything they told me to- taking my medicine like they told me to; home health and Palliative care have already been in to visit me and home health are coming regularly.  Using my walker.  Doing my daily weights and they have been stable all week.  Somehow we misplaced the metoprolol , and I will be out this weekend from my old bottle, I need a new refill called in, but the pharmacy is telling me they can't refill it because it is too soon.  I am not in any pain or discomfort, and my breathing is fine using 3 L of the home O2, all the time;"    Denies clinical concerns and sounds to be in no distress throughout [facility] 30-day program outreach call today  Care Coordination outreach to [facility] [place]:  Spoke with "Abby;" facilitated patient getting refill for metoprolol , which was misplaced at her home post-hospital discharge: Abby reports refill will be ready for pick up today- patient made  aware and confirms she will obtain   Patient Reported Symptoms: Cognitive Cognitive Status: Alert and oriented to person, place, and time, Insightful and able to interpret abstract concepts, Normal speech and language skills Cognitive/Intellectual Conditions Management [RPT]: None reported or documented in medical history or problem list   Health Maintenance Behaviors: Annual physical exam  Neurological Neurological Review of Symptoms: No symptoms reported Neurological Management Strategies: Routine screening  HEENT HEENT Symptoms  Reported: No symptoms reported      Cardiovascular Cardiovascular Symptoms Reported: No symptoms reported Does patient have uncontrolled Hypertension?: No Cardiovascular Conditions: Dysrhythmia, Hypertension, Heart failure Cardiovascular Management Strategies: Activity, Adequate rest, Coping strategies, Diet modification, Routine screening, Medication therapy, Fluid modification Do You Have a Working Readable Scale?: Yes Weight: 175 lb 6.4 oz (79.6 kg) (reviewed daily weights since hospital discharge: reports consistent weights between 173-175 lbs; denies weight gain > 3 lbs overnight/ 5 lbs in one week)  Respiratory Respiratory Symptoms Reported: No symptoms reported Other Respiratory Symptoms: Patient denies shortness of breath/ cough outside of baseline and sounds to be in no distress throughout TOC call today; confirmed using home O2 consistently at 3 L/min- continuous Respiratory Conditions: Shortness of breath, COPD  Endocrine Patient reports the following symptoms related to hypoglycemia or hyperglycemia : No symptoms reported Is patient diabetic?: Yes Is patient checking blood sugars at home?: Yes Endocrine Conditions: Diabetes Endocrine Management Strategies: Coping strategies, Diet modification, Routine screening, Medical device, Medication therapy  Gastrointestinal Gastrointestinal Symptoms Reported: No symptoms reported      Genitourinary Genitourinary Symptoms Reported: No symptoms reported    Integumentary Integumentary Symptoms Reported: No symptoms reported    Musculoskeletal Musculoskelatal Symptoms Reviewed: Unsteady gait, Weakness, Difficulty walking Additional Musculoskeletal Details: confirmed continues using rolling walker "all the time" Musculoskeletal Conditions: Mobility limited Musculoskeletal Management Strategies: Medical device, Adequate rest, Routine screening      Psychosocial Psychosocial Symptoms Reported: No symptoms reported         There were no  vitals filed for this visit.  Medications Reviewed Today     Reviewed by Rabecka Brendel M, RN (Registered Nurse) on 06/15/23 at [phone]  Med List Status: <None>   Medication Order Taking? Sig Documenting Provider Last Dose Status Informant  acetaminophen  (TYLENOL ) 500 MG tablet [id number] No Take 500 mg by mouth every 6 (six) hours as needed for moderate pain. [provider] Taking Active Self, Child, Pharmacy Records           Med Note Mliss Anderson Rayne, [name]   Mon May 14, 2023  2:52 PM)    ALPRAZolam  (XANAX ) 0.25 MG tablet [id number] No Take 1 tablet (0.25 mg total) by mouth 3 (three) times daily as needed for anxiety. Roslyn Coombe, MD Taking Active Self, Child, Pharmacy Records  amLODipine  (NORVASC ) 5 MG tablet [id number] No Take 1 tablet (5 mg total) by mouth daily. Deforest Fast, MD Taking Active   atorvastatin  (LIPITOR ) 40 MG tablet [id number] No TAKE 1 TABLET BY MOUTH EVERY DAY Arleen Lacer, MD Taking Active Self, Child, Pharmacy Records  blood glucose meter kit and supplies [id number] No Dispense based on patient and insurance preference. Use up to four times daily as directed. E11.9 Roslyn Coombe, MD Taking Active Self, Child, Pharmacy Records  Blood Glucose Monitoring Suppl (ONE TOUCH ULTRA 2) w/Device Suzanne Erps [id number] No Use as directed daily Roslyn Coombe, MD Taking Active Self, Child, Pharmacy Records  [facility] VITAMIN B12 1000 MCG tablet [id number] No TAKE 1 TABLET BY MOUTH EVERY DAY Rosalia Colonel  W, MD Taking Active Self, Child, Pharmacy Records  hydrALAZINE  (APRESOLINE ) 50 MG tablet [id number] No Take 1 tablet (50 mg total) by mouth 3 (three) times daily. Deforest Fast, MD Taking Active   insulin  glargine (LANTUS ) 100 UNIT/ML Solostar Pen [id number] No Inject 40 Units into the skin daily. Deforest Fast, MD Taking Active   Insulin  Pen Needle 32G X 4 MM MISC [id number] No Use with Lantus  pen Deforest Fast, MD Taking Active   isosorbide  mononitrate (IMDUR ) 30 MG 24 hr tablet  [id number] No Take 2 tablets (60 mg total) by mouth daily. Deforest Fast, MD Taking Active   Lancets [facility] Jewelene Morton PLUS Wiley Ford) MISC [id number] No USE AS DIRECTED TEST 1-2 TIMES A DAY Roslyn Coombe, MD Taking Active Self, Child, Pharmacy Records  levothyroxine  (SYNTHROID ) 25 MCG tablet [id number] No TAKE 1 TABLET BY MOUTH EVERY DAY Roslyn Coombe, MD Taking Active Self, Child, Pharmacy Records  metFORMIN  (GLUCOPHAGE ) 1000 MG tablet [id number] No TAKE 1 TABLET BY MOUTH IN THE AM AND 1/2 IN THE PM Roslyn Coombe, MD Taking Active Self, Child, Pharmacy Records  metoprolol  succinate (TOPROL -XL) 25 MG 24 hr tablet [id number]  Take 0.5 tablets (12.5 mg total) by mouth daily. Roslyn Coombe, MD  Active   mirtazapine  (REMERON ) 15 MG tablet [id number] No TAKE 1 TABLET BY MOUTH EVERYDAY AT BEDTIME Roslyn Coombe, MD Taking Active Self, Child, Pharmacy Records           Med Note [facility] Ashley, [name]   Mon May 14, 2023  3:12 PM) Pt adamant of taking medication. Dispense report does not support claim.  ONETOUCH ULTRA test strip [id number] No USE AS INSTRUCTED TEST 1-2 TIMES A DAY Roslyn Coombe, MD Taking Active Self, Child, Pharmacy Records  pantoprazole  (PROTONIX ) 40 MG tablet [id number] No TAKE 1 TABLET BY MOUTH TWICE A DAY Ardia Kraft, PA-C Taking Active Self, Child, Pharmacy Records  predniSONE  (DELTASONE ) 20 MG tablet [id number] No Take 2 tablets (40 mg total) by mouth daily with breakfast. Deforest Fast, MD Taking Active   solifenacin  (VESICARE ) 5 MG tablet [id number] No Take 1 tablet (5 mg total) by mouth daily. Roslyn Coombe, MD Taking Active Self, Child, Pharmacy Records  torsemide  (DEMADEX ) 20 MG tablet [id number] No Take 2 tablets (40 mg total) by mouth daily. Deforest Fast, MD Taking Active            Recommendation:   PCP Follow-up- as scheduled 06/27/23 Specialty provider follow-up- as scheduled: pulmonary 06/20/23; cardiology 06/21/23 Continue established plan of care post-hospital  discharge  Follow Up Plan:   Telephone follow-up in 1 week- as scheduled 06/22/23  Plan for next week's call: Review medication adherence Review provider office visits from 06/20/23 and 06/21/23/ labs/ medication changes, if any Review upcoming provider office visits Review home health PT/ RN visits Review daily weights since day 06/15/23-- previously reported as "between 173-175 lbs" consistently Reinforce education re: self health management of CHF/ daily weights/ safe use of home O2- 3 L/min Review blood sugars at home; use of insulin / insulin  dosing/ ensure no hypoglycemia Pls call/ message for questions,  Cameran Ahmed Mckinney Ranyia Witting, RN, BSN, CCRN Alumnus RN Care Manager  Transitions of Care  [facility] - [facility] [phone]: direct office

## 2023-06-20 ENCOUNTER — Ambulatory Visit: Admitting: Pulmonary Disease

## 2023-06-20 ENCOUNTER — Ambulatory Visit (INDEPENDENT_AMBULATORY_CARE_PROVIDER_SITE_OTHER)

## 2023-06-20 ENCOUNTER — Encounter: Payer: Self-pay | Admitting: Pulmonary Disease

## 2023-06-20 VITALS — BP 118/58 | HR 57 | Ht 61.0 in | Wt 176.4 lb

## 2023-06-20 DIAGNOSIS — I4819 Other persistent atrial fibrillation: Secondary | ICD-10-CM | POA: Diagnosis not present

## 2023-06-20 DIAGNOSIS — I5033 Acute on chronic diastolic (congestive) heart failure: Secondary | ICD-10-CM | POA: Diagnosis not present

## 2023-06-20 DIAGNOSIS — N1832 Chronic kidney disease, stage 3b: Secondary | ICD-10-CM

## 2023-06-20 DIAGNOSIS — I272 Pulmonary hypertension, unspecified: Secondary | ICD-10-CM | POA: Diagnosis not present

## 2023-06-20 DIAGNOSIS — J849 Interstitial pulmonary disease, unspecified: Secondary | ICD-10-CM

## 2023-06-20 DIAGNOSIS — Z7722 Contact with and (suspected) exposure to environmental tobacco smoke (acute) (chronic): Secondary | ICD-10-CM | POA: Diagnosis not present

## 2023-06-20 DIAGNOSIS — J9601 Acute respiratory failure with hypoxia: Secondary | ICD-10-CM | POA: Diagnosis not present

## 2023-06-20 DIAGNOSIS — I509 Heart failure, unspecified: Secondary | ICD-10-CM

## 2023-06-20 DIAGNOSIS — N179 Acute kidney failure, unspecified: Secondary | ICD-10-CM | POA: Diagnosis not present

## 2023-06-20 DIAGNOSIS — D509 Iron deficiency anemia, unspecified: Secondary | ICD-10-CM | POA: Diagnosis not present

## 2023-06-20 DIAGNOSIS — E1169 Type 2 diabetes mellitus with other specified complication: Secondary | ICD-10-CM | POA: Diagnosis not present

## 2023-06-20 DIAGNOSIS — E538 Deficiency of other specified B group vitamins: Secondary | ICD-10-CM | POA: Diagnosis not present

## 2023-06-20 DIAGNOSIS — N184 Chronic kidney disease, stage 4 (severe): Secondary | ICD-10-CM | POA: Diagnosis not present

## 2023-06-20 DIAGNOSIS — K219 Gastro-esophageal reflux disease without esophagitis: Secondary | ICD-10-CM | POA: Diagnosis not present

## 2023-06-20 DIAGNOSIS — I480 Paroxysmal atrial fibrillation: Secondary | ICD-10-CM

## 2023-06-20 DIAGNOSIS — E1122 Type 2 diabetes mellitus with diabetic chronic kidney disease: Secondary | ICD-10-CM | POA: Diagnosis not present

## 2023-06-20 DIAGNOSIS — M810 Age-related osteoporosis without current pathological fracture: Secondary | ICD-10-CM | POA: Diagnosis not present

## 2023-06-20 DIAGNOSIS — I700 Atherosclerosis of aorta: Secondary | ICD-10-CM | POA: Diagnosis not present

## 2023-06-20 DIAGNOSIS — I130 Hypertensive heart and chronic kidney disease with heart failure and stage 1 through stage 4 chronic kidney disease, or unspecified chronic kidney disease: Secondary | ICD-10-CM | POA: Diagnosis not present

## 2023-06-20 DIAGNOSIS — E7849 Other hyperlipidemia: Secondary | ICD-10-CM | POA: Diagnosis not present

## 2023-06-20 DIAGNOSIS — E039 Hypothyroidism, unspecified: Secondary | ICD-10-CM | POA: Diagnosis not present

## 2023-06-20 DIAGNOSIS — I2510 Atherosclerotic heart disease of native coronary artery without angina pectoris: Secondary | ICD-10-CM | POA: Diagnosis not present

## 2023-06-20 DIAGNOSIS — I160 Hypertensive urgency: Secondary | ICD-10-CM | POA: Diagnosis not present

## 2023-06-20 DIAGNOSIS — E1165 Type 2 diabetes mellitus with hyperglycemia: Secondary | ICD-10-CM | POA: Diagnosis not present

## 2023-06-20 DIAGNOSIS — J690 Pneumonitis due to inhalation of food and vomit: Secondary | ICD-10-CM | POA: Diagnosis not present

## 2023-06-20 NOTE — Progress Notes (Signed)
 Takiera Mando    [id number]    [birth year]  Primary Care Physician:John, Alveda Aures, MD  Referring Physician: Ervin Heath, PA [address] [place],  [place] [address]  Chief complaint: Posthospitalization follow-up for interstitial lung disease, suspected amiodarone  toxicity  HPI: 83 y.o. who  has a past medical history of ALLERGIC RHINITIS (06/24/2007), Anemia, AVM (arteriovenous malformation) of colon, Blood transfusion without reported diagnosis (05/2015), COLONIC POLYPS, HX OF (06/24/2007), Coronary artery disease, non-occlusive (05/2015), DEPRESSION (10/08/2006), DIABETES MELLITUS, TYPE II (10/05/2006), GERD (06/24/2007), History of CVA (cerebrovascular accident) (08/10/2020), HYPERLIPIDEMIA (10/08/2006), HYPERTENSION (10/08/2006), INSOMNIA (09/24/2008), Left knee DJD, Nonsustained paroxysmal ventricular tachycardia (HCC) (10/2018), Osteoporosis (03/10/2016), PAF (paroxysmal atrial fibrillation) (HCC), PAF with RVR: CHA2DS2-VASc 9.  On Eliquis  and amiodarone . (08/11/2020), PEPTIC ULCER DISEASE (10/08/2006), S/P TAVR (transcatheter aortic valve replacement) (10/26/2020), and Severe aortic stenosis (08/2020).  Discussed the use of AI scribe software for clinical note transcription with the patient, who gave verbal consent to proceed.  History of Present Illness Margareta Brixey is an 83 year old female with heart failure, atrial fibrillation, and chronic kidney disease who presents with shortness of breath and swelling. She is accompanied by her son, Allayne Arabian.  She was hospitalized from April 7 to Jun 07, 2023, for acute hypoxic respiratory failure. Previously on two liters of oxygen , she continues to require oxygen  support. During her hospitalization, she was treated with diuretics for suspected heart failure, which helped reduce fluid overload. However, she remains short of breath and experiences swelling, particularly in her legs, which prevents her from wearing  regular shoes.  Her medical history includes heart failure, atrial fibrillation, and aortic stenosis, for which she underwent aortic valve replacement (TAVR) approximately six years ago. She was on amiodarone , which was discontinued due to concerns about lung toxicity, and she was started on IV Solu-Medrol  and discharged on prednisone  40 mg daily since Jun 08, 2023. She feels 'okay' but weak. She was also treated with antibiotics in the hospital due to a suspected aspiration event.  Her chronic kidney disease is stage 3B, and there is concern about her kidney function, especially in relation to diuretic use. We  Pets: She has a dog and no other pets. 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  Exposures: No mold, hot tub, Jacuzzi.  No feather pillows or comforters No h/o chemo/XRT/amiodarone /macrodantin/MTX  No exposure to asbestos, silica or other organic allergens  Smoking history: She has never smoked but was exposed to significant secondhand smoke from two husbands who smoked heavily.  Travel history: No significant travel history Relevant family history:Her family history includes a sister with lung disease from smoking and a mother with throat cancer from snuff use.   Outpatient Encounter Medications as of 06/20/2023  Medication Sig   acetaminophen  (TYLENOL ) 500 MG tablet Take 500 mg by mouth every 6 (six) hours as needed for moderate pain.   ALPRAZolam  (XANAX ) 0.25 MG tablet Take 1 tablet (0.25 mg total) by mouth 3 (three) times daily as needed for anxiety.   amLODipine  (NORVASC ) 5 MG tablet Take 1 tablet (5 mg total) by mouth daily.   atorvastatin  (LIPITOR ) 40 MG tablet TAKE 1 TABLET BY MOUTH EVERY DAY   blood glucose meter kit and supplies Dispense based on patient and insurance preference. Use up to four times daily as directed. E11.9   Blood  Glucose Monitoring  Suppl (ONE TOUCH ULTRA 2) w/Device KIT Use as directed daily   [employer] VITAMIN B12 1000 MCG tablet TAKE 1 TABLET BY MOUTH EVERY DAY   hydrALAZINE  (APRESOLINE ) 50 MG tablet Take 1 tablet (50 mg total) by mouth 3 (three) times daily.   insulin  glargine (LANTUS ) 100 UNIT/ML Solostar Pen Inject 40 Units into the skin daily.   Insulin  Pen Needle 32G X 4 MM MISC Use with Lantus  pen   isosorbide  mononitrate (IMDUR ) 30 MG 24 hr tablet Take 2 tablets (60 mg total) by mouth daily.   Lancets (ONETOUCH DELICA PLUS LANCET33G) MISC USE AS DIRECTED TEST 1-2 TIMES A DAY   levothyroxine  (SYNTHROID ) 25 MCG tablet TAKE 1 TABLET BY MOUTH EVERY DAY   metFORMIN  (GLUCOPHAGE ) 1000 MG tablet TAKE 1 TABLET BY MOUTH IN THE AM AND 1/2 IN THE PM   metoprolol  succinate (TOPROL -XL) 25 MG 24 hr tablet Take 0.5 tablets (12.5 mg total) by mouth daily.   mirtazapine  (REMERON ) 15 MG tablet TAKE 1 TABLET BY MOUTH EVERYDAY AT BEDTIME   ONETOUCH ULTRA test strip USE AS INSTRUCTED TEST 1-2 TIMES A DAY   pantoprazole  (PROTONIX ) 40 MG tablet TAKE 1 TABLET BY MOUTH TWICE A DAY   predniSONE  (DELTASONE ) 20 MG tablet Take 2 tablets (40 mg total) by mouth daily with breakfast.   solifenacin  (VESICARE ) 5 MG tablet Take 1 tablet (5 mg total) by mouth daily.   torsemide  (DEMADEX ) 20 MG tablet Take 2 tablets (40 mg total) by mouth daily.   No facility-administered encounter medications on file as of 06/20/2023.    Allergies as of 06/20/2023 - Review Complete 06/20/2023  Allergen Reaction Noted   Ibuprofen Other (See Comments)    Amiodarone  Other (See Comments) 06/04/2023    Past Medical History:  Diagnosis Date   ALLERGIC RHINITIS 06/24/2007   Anemia    AVM (arteriovenous malformation) of colon    Blood transfusion without reported diagnosis 05/2015   COLONIC POLYPS, HX OF 06/24/2007   Coronary artery disease, non-occlusive 05/2015   Trop + w/ Acute Anemia =>CATH: small RI - Ostial 60%, ostial RCA 30% and dLAD 40-50%;;  9/'21: Cor Ca Score 624. Mild (25-49%) prox RCA & LAD,; Moderate (50-69%) Ostial Small RI & prox LCx.     DEPRESSION 10/08/2006   DIABETES MELLITUS, TYPE II 10/05/2006   GERD 06/24/2007   History of CVA (cerebrovascular accident) 08/10/2020   08/2020- found to be in afib with RVR, started on Eliquis  (shower of emboli from A. fib as complication of severe AS)   HYPERLIPIDEMIA 10/08/2006   HYPERTENSION 10/08/2006   INSOMNIA 09/24/2008   Left knee DJD    Nonsustained paroxysmal ventricular tachycardia (HCC) 10/2018   Post-TAVR Zio Patch: Frequent (206) runs of NSVT - Fastest 5 beats 239 bpm. Longest 9.3 Sec avg 135 bpm. (? possibly Afib w/ aberrancy)   Osteoporosis 03/10/2016   PAF (paroxysmal atrial fibrillation) (HCC)    PAF with RVR: CHA2DS2-VASc 9.  On Eliquis  and amiodarone . 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 Abel Hoe & Dr. Sherene Dilling   Severe aortic stenosis 08/2020   Progression from Mod-Severe AS to Severe AS - noted on Echo 08/2020 -> (mean gradient progressed from 34 to 42 mmHg) this was in setting of new onset A. fib RVR, acute diastolic HF and shower of emboli CVA. ->  Referred for TAVR, completed  10/26/2020    Past Surgical History:  Procedure Laterality Date   APPENDECTOMY     BIOPSY  10/21/2020   Procedure: BIOPSY;  Surgeon: Daina Drum, MD;  Location: [facility] ENDOSCOPY;  Service: Gastroenterology;;   BREAST BIOPSY     CARDIAC CATHETERIZATION N/A 05/21/2015   Procedure: Left Heart Cath and Coronary Angiography;  Surgeon: Arleen Lacer, MD;  Location: [facility] INVASIVE CV LAB;  Service: Cardiovascular;: Ost RI 60%, Ost RCA 30%, dLAD tapers to small vessel w/ 40-50%. Mildly elevated LVEDP. Normal LV Fxn.   COLONOSCOPY N/A 05/20/2015   Procedure: COLONOSCOPY;  Surgeon: Danette Duos, MD;  Location: [facility] ENDOSCOPY;  Service:  Gastroenterology;  Laterality: N/A;   CORONARY CA2+ SCORE / CARDIAC CT ANGIOGRAM  10/09/2019   Calcium  score 624.  82nd percentile. Dominant RCA: Mild (25-49%) proximal stenosis-distal bifurcation into PDA and PAV--< RPL branches.  LAD (1 major mid vessel diagonal) diffuse calcified plaque, mild proximal stenosis with minimal distal stenosis.  Small RI moderate ostial disease.  LCx-moderate mixed (50-69%) proximal stenosis.  Small dOM1 disease.  Trileaflet AoV, annular Ca2+ - probable AS   ESOPHAGOGASTRODUODENOSCOPY N/A 05/20/2015   Procedure: ESOPHAGOGASTRODUODENOSCOPY (EGD);  Surgeon: Danette Duos, MD;  Location: [facility] ENDOSCOPY;  Service: Gastroenterology;  Laterality: N/A;   ESOPHAGOGASTRODUODENOSCOPY (EGD) WITH PROPOFOL  N/A 10/21/2020   Procedure: ESOPHAGOGASTRODUODENOSCOPY (EGD) WITH PROPOFOL ;  Surgeon: Daina Drum, MD;  Location: [facility] ENDOSCOPY;  Service: Gastroenterology;  Laterality: N/A;   INTRAOPERATIVE TRANSTHORACIC ECHOCARDIOGRAM N/A 10/26/2020   Procedure: INTRAOPERATIVE TRANSTHORACIC ECHOCARDIOGRAM;  Surgeon: Abel Hoe; Location: [facility] OR; Pre-TAVR well-visualized calcified AoV mean Grad 37 mmHg, AVA 0.72 cm => Post TAVR well-positioned supra-annular 26 mm Medtronic Evolut Pro valve placed with no PVL.  Mean gradient 14 mmHg.  AVR 1.58 cm.  Normal flow to RCA and LM-LAD.  ; EF 60 to 65%.  Degenerative severe MAC w/ mild MR.   RIGHT/LEFT HEART CATH AND CORONARY ANGIOGRAPHY N/A 09/29/2020   Procedure: RIGHT/LEFT HEART CATH AND CORONARY ANGIOGRAPHY;  Surgeon: Odie Benne, MD;  Location: [facility] INVASIVE CV LAB;  Service: Cardiovascular; pre-TAVR:  pRCA 20%, m-dRCA 30%. D LM-pLAD 20%. RI 30%, mLAD 20%.  Severe AS with mean gradient measured at 52.8 mmHg.   TONSILLECTOMY     TRANSCATHETER AORTIC VALVE REPLACEMENT, TRANSFEMORAL N/A 10/26/2020   Procedure: TRANSCATHETER AORTIC VALVE REPLACEMENT, TRANSFEMORAL;  Surgeon: Odie Benne, MD;  Location: [facility] OR;  Service: Open  Heart Surgery;; Medtronic Evolut-Pro + (size 26 mm, model # EVPROPLUS -26US, serial # [id number]); transient high-grade AV block noted so PPM left in place.   TRANSTHORACIC ECHOCARDIOGRAM  03/17/2019   a) 03/2019: EF 60 to 65%.  Moderate LVH.  GRII DD.  Mod-Severe AS (m grad 36 mmHg, peak 59 mmHg); b) 09/2019: EF 65-70%, No RWMA. Gr1 DD/hi LAP, Mild hi PAP. Mod LA Dil. MOD AS (mean Grad 34.5 mmHg).  STABLE   TRANSTHORACIC ECHOCARDIOGRAM  08/27/2020   Admitted for CVA/Afib RVR & CHF (Progression to SEVERE SYMPTOMATIC AS):  Severe Calcific Aortic Stenosis (VTI AVA estimated 0.86 cm, mean gradient 42 mmHg, V-max 4.36 m/s).  EF 55 to 60%.  Severe concentric LVH.  Unable to determine diastolic parameters.  Moderately elevated PAP.  Mild LA dilation.  Mild circumferential pericardial effusion.  Trivial MR.  Severe MAC.   TRANSTHORACIC ECHOCARDIOGRAM  11/25/2020   1 Month s/p TAVR: EF 55 to 60%.  No R WMA.  GR 1 DD.  Elevated LVEDP.  Normal RV size with mildly elevated RVP estimated  44 mmHg.  Oscillating density in the RV suspect calcified chordal apparatus versus calcified thrombus.  Moderate LA dilation.  Normal IVC/RA P => Well-positioned 26 mm Medtronic Evolut Pro THVwith mean AOV gradient 11 mmHg.  Trivial PVL   TRANSTHORACIC ECHOCARDIOGRAM  11/02/2020   a) Day 1 Post-op TAVR 10/27/20:  Well-Positioned Supra Annular Medtronic Evolut Pro THP.  Mean gradient 12 mmHg, peak 24 mmHg.  AVA 1.8 cm.  Trivial PVL.  EF 60 to 65%.  Normal LV function.  Mild LVH.  Severe LA dilation.  Mild RA dilation.  Severe MAC;; b) 11/02/20: Normal structure and function of the aortic valve prosthesis.  Mean gradient 9 mmHg (otherwise stable)   TUBAL LIGATION      Family History  Problem Relation Age of Onset   Alcohol abuse Other    Arthritis Other        DJD   Hyperlipidemia Other    Heart disease Other    Stroke Other    Hypertension Other    Depression Other    Diabetes Other    Cancer Mother        ENT cancer   CAD  Sister        Several MI & PPM; long term smoker, EtOH   Heart attack Sister 47       several   Arrhythmia Sister        s/p PPM (Dr. Alvis Ba)    Social History   Socioeconomic History   Marital status: Widowed    Spouse name: Not on file   Number of children: 1   Years of education: Not on file   Highest education level: High school graduate  Occupational History   Occupation: retired - Oceanographer: RETIRED  Tobacco Use   Smoking status: Never    Passive exposure: Past   Smokeless tobacco: Never  Vaping Use   Vaping status: Never Used  Substance and Sexual Activity   Alcohol use: No    Alcohol/week: 0.0 standard drinks of alcohol   Drug use: No   Sexual activity: Not Currently  Other Topics Concern   Not on file  Social History Narrative   Recently widowed--husband died in Dec 07, 2018.      Now lives alone but her son usually stays with her at night.  During the day she has 2 nephews who live nearby to come in and check on her intermittently.  Also one of her nieces calls routinely.      When her son is home and awake, she may try to walk on a treadmill, but is scared to walk outside.   Social Drivers of Corporate investment banker Strain: Low Risk  (05/17/2023)   Overall Financial Resource Strain (CARDIA)    Difficulty of Paying Living Expenses: Not very hard  Food Insecurity: No Food Insecurity (06/08/2023)   Hunger Vital Sign    Worried About Running Out of Food in the Last Year: Never true    Ran Out of Food in the Last Year: Never true  Transportation Needs: No Transportation Needs (06/08/2023)   PRAPARE - Administrator, Civil Service (Medical): No    Lack of Transportation (Non-Medical): No  Physical Activity: Inactive (05/11/2022)   Exercise Vital Sign    Days of Exercise per Week: 0 days    Minutes of Exercise per Session: 0 min  Stress: No Stress Concern Present (05/11/2022)   [employer] of Occupational Health -  Occupational Stress Questionnaire    Feeling of Stress : Not at all  Social Connections: Socially Isolated (05/14/2023)   Social Connection and Isolation Panel [NHANES]    Frequency of Communication with Friends and Family: More than three times a week    Frequency of Social Gatherings with Friends and Family: Once a week    Attends Religious Services: Never    Database administrator or Organizations: No    Attends Banker Meetings: Never    Marital Status: Widowed  Intimate Partner Violence: Not At Risk (06/08/2023)   Humiliation, Afraid, Rape, and Kick questionnaire    Fear of Current or Ex-Partner: No    Emotionally Abused: No    Physically Abused: No    Sexually Abused: No    Review of systems: Review of Systems  Constitutional: Negative for fever and chills.  HENT: Negative.   Eyes: Negative for blurred vision.  Respiratory: as per HPI  Cardiovascular: Negative for chest pain and palpitations.  Gastrointestinal: Negative for vomiting, diarrhea, blood per rectum. Genitourinary: Negative for dysuria, urgency, frequency and hematuria.  Musculoskeletal: Negative for myalgias, back pain and joint pain.  Skin: Negative for itching and rash.  Neurological: Negative for dizziness, tremors, focal weakness, seizures and loss of consciousness.  Endo/Heme/Allergies: Negative for environmental allergies.  Psychiatric/Behavioral: Negative for depression, suicidal ideas and hallucinations.  All other systems reviewed and are negative.  Physical Exam: Blood pressure (!) 118/58, pulse (!) 57, height 5\' 1"  (1.549 m), weight 176 lb 6.4 oz (80 kg), SpO2 100%. Gen:      No acute distress HEENT:  EOMI, sclera anicteric Neck:     No masses; no thyromegaly Lungs:    Clear to auscultation bilaterally; normal respiratory effort CV:         Regular rate and rhythm; no murmurs Abd:      + bowel sounds; soft, non-tender; no palpable masses, no distension Ext:    No edema; adequate  peripheral perfusion Skin:      Warm and dry; no rash Neuro: alert and oriented x 3 Psych: normal mood and affect  Data Reviewed: Imaging: Chest x-ray today chest x-ray 05/27/2023-coarse bilateral pulmonary opacities left greater than right CT chest 05/27/2023-diffuse multifocal patchy airspace disease, groundglass opacities which is new compared to earlier this year I have reviewed the images personally  PFTs:  Labs: Assessment & Plan Interstitial lung disease Abnormal CT scan indicates interstitial lung disease, likely secondary to amiodarone  toxicity. Differential includes autoimmune etiologies, hypersensitivity pneumonitis or IPF. Biopsy not recommended due to age and condition. Currently on prednisone  40 mg since Jun 08, 2023, with plans to taper. Off amiodarone . Potential for improvement with prednisone , but risk of progressive disease remains.  - Order chest x-ray - Order blood tests to rule out autoimmune causes such as lupus or rheumatoid arthritis - Taper prednisone  by 10 mg every two weeks - Schedule high-resolution CT scan and lung function test in 2-3 months  Heart failure Paroxysmal atrial fibrillation History of TAVR Heart failure with recent hospitalization for acute hypoxic respiratory failure. Diuresed in hospital due to fluid overload. Currently experiencing peripheral edema. - Monitor fluid status and adjust diuretics based on kidney function tests - Amiodarone  discontinued due to potential lung toxicity. Currently on prednisone . - Not on anticoagulation due to prior history of bleed  Chronic kidney disease, stage 3B Chronic kidney disease stage 3B with concerns about renal function stability. Peripheral edema may necessitate diuretic adjustment. Will assess renal function before modifying diuretics. -  Order kidney function tests - Consider increasing torsemide  dose by 20 mg if renal function is stable  Goals of Care Discussed prognosis and goals of care. She  prefers to be informed about her condition and prognosis, and to avoid aggressive interventions. Currently under palliative and hospice care. Acknowledges age and multiple health issues, desires honest communication about condition progression.  Recommendations: Chest x-ray today Blood test to look for autoimmune Check kidney function and consider increasing torsemide  dose Follow-up high-res CT and PFTs  Phyllis Breeze MD [facility] 06/20/2023, 2:43 PM  CC: Meng, Hao, PA

## 2023-06-20 NOTE — Progress Notes (Signed)
 " Cardiology Office Note:   Date:  06/20/2023  ID:  Mary Mccarthy Mary Mccarthy 1940-10-25, MRN [id number] PCP: [name], MD  [facility] Providers Cardiologist:  Alm Clay, MD Electrophysiologist:  Jerel Balding, MD    History of Present Illness:   Discussed the use of AI scribe software for clinical note transcription with the patient, who gave verbal consent to proceed.  History of Present Illness Mary Mccarthy is an 83 year old female with hx of HTN, HLD, chronic respiratory failure on oxygen  at baseline,chronic diastolic heart failure, persistent A. Fib not on chronic AC due to recurrent GI bleed, history of PUD/AVM, history of CVA, severe AS s/p TAVR (26 mm Medtronic Evolut Pro+ THV via TF approach in 10/2020) complicated by transient complete heart block, type 2 diabetes, mild CAD by pre-TAVR cath 09/2020, hypothyroidism, GERD, anemia previously requiring transfusions. She presents for follow-up after recent hospitalization.   She was hospitalized from April 7th to May 1st, 2025, for acute hypoxic respiratory failure. During her admission, she was treated for congestive heart failure with diuretics and received antibiotics for pneumonia. Imaging was performed to evaluate for interstitial lung disease, and she was taken off amiodarone  and started on steroids. At discharge, she was switched from Lasix  to torsemide .  Today he reports improvement in her breathing since returning home, stating she can 'get up and move around' and perform daily activities like washing dishes and bathing, although she is cautious about using the shower due to its narrowness. Her oxygen  saturation remained around 95-96% even after forgetting to use her oxygen  concentrator for two hours. Her current oxygen  flow is set at 3 liters per minute, as it was in the hospital. She continues on a prednisone  taper and recently saw Dr. Arvel, who ordered inflammatory labs to explore non-amiodarone   causes of interstitial lung disease. These labs are pending.  Regarding her heart failure, she was previously on Lasix  but is now taking 40 mg of torsemide  daily. She reports less frequent urination compared to before her hospitalization and notes significant swelling in her legs and feet, which she describes as the worst it has ever been. Her weight has remained stable at around 175 pounds since discharge.  She has a history of paroxysmal atrial fibrillation but reports no palpitations or skipped heartbeats currently. She was previously on amiodarone , which was stopped during her hospital stay. She has not been on blood thinners due to prior bleeding concerns and does not take aspirin .  She sleeps in a recliner, as she did before her hospitalization, because lying flat makes it difficult to breathe. She has been on amlodipine  for a long time but was off it for two weeks before her hospital admission due to a prescription issue. Her blood pressure readings have been low, and she does not track blood pressure at home.  She drinks about two liters of fluid daily, including water, coffee, and ginger ale, and tries to avoid salty foods. She experiences frequent thirst but tries to limit fluid intake.   Studies Reviewed:        05/15/23 TTE  IMPRESSIONS     1. Left ventricular ejection fraction, by estimation, is 60 to 65%. The  left ventricle has normal function. The left ventricle has no regional  wall motion abnormalities. There is severe asymmetric left ventricular  hypertrophy of the septal segment. Left   ventricular diastolic function could not be evaluated.   2. Right ventricular systolic function is normal. The right ventricular  size is mildly enlarged. There is moderately elevated pulmonary artery  systolic pressure. The estimated right ventricular systolic pressure is  53.7 mmHg.   3. Left atrial size was mildly dilated.   4. Right atrial size was mildly dilated.   5. The  mitral valve is degenerative. Trivial mitral valve regurgitation.  Mild mitral stenosis. The mean mitral valve gradient is 3.0 mmHg. Severe  mitral annular calcification.   6. Tricuspid valve regurgitation is mild to moderate.   7. Mild paravalvular leak adjacent to the intervalvular fibrosa. The  aortic valve has been repaired/replaced. Aortic valve regurgitation is  mild. No aortic stenosis is present. There is a 26 mm Medtronic stented  (TAVR) valve present in the aortic  position. Procedure Date: 10/26/2020. Echo findings are consistent with  normal structure and function of the aortic valve prosthesis. Echo  findings are consistent with perivalvular leak of the aortic prosthesis.  Aortic regurgitation PHT measures 514 msec.   Aortic valve mean gradient measures 9.0 mmHg. Aortic valve Vmax measures  1.88 m/s.   8. The inferior vena cava is dilated in size with <50% respiratory  variability, suggesting right atrial pressure of 15 mmHg.   Comparison(s): Prior images reviewed side by side. PVL is more prominent,  stable RVSP.   FINDINGS   Left Ventricle: Left ventricular ejection fraction, by estimation, is 60  to 65%. The left ventricle has normal function. The left ventricle has no  regional wall motion abnormalities. Strain was performed and the global  longitudinal strain is  indeterminate. The left ventricular internal cavity size was normal in  size. There is severe asymmetric left ventricular hypertrophy of the  septal segment. Left ventricular diastolic function could not be evaluated  due to mitral annular calcification  (moderate or greater). Left ventricular diastolic function could not be  evaluated.   Right Ventricle: The right ventricular size is mildly enlarged. No  increase in right ventricular wall thickness. Right ventricular systolic  function is normal. There is moderately elevated pulmonary artery systolic  pressure. The tricuspid regurgitant  velocity is 3.11  m/s, and with an assumed right atrial pressure of 15  mmHg, the estimated right ventricular systolic pressure is 53.7 mmHg.   Left Atrium: Left atrial size was mildly dilated.   Right Atrium: Right atrial size was mildly dilated.   Pericardium: There is no evidence of pericardial effusion.   Mitral Valve: The mitral valve is degenerative in appearance. Severe  mitral annular calcification. Trivial mitral valve regurgitation. Mild  mitral valve stenosis. MV peak gradient, 11.8 mmHg. The mean mitral valve  gradient is 3.0 mmHg with average heart  rate of 59 bpm.   Tricuspid Valve: The tricuspid valve is normal in structure. Tricuspid  valve regurgitation is mild to moderate. No evidence of tricuspid  stenosis.   Aortic Valve: Mild paravalvular leak adjacent to the intervalvular  fibrosa. The aortic valve has been repaired/replaced. Aortic valve  regurgitation is mild. Aortic regurgitation PHT measures 514 msec. No  aortic stenosis is present. Aortic valve mean  gradient measures 9.0 mmHg. Aortic valve peak gradient measures 14.1 mmHg.  Aortic valve area, by VTI measures 1.92 cm. There is a 26 mm Medtronic  stented (TAVR) valve present in the aortic position. Procedure Date:  10/26/2020. Echo findings are  consistent with normal structure and function of the aortic valve  prosthesis.   Pulmonic Valve: The pulmonic valve was normal in structure. Pulmonic valve  regurgitation is not visualized. No evidence of pulmonic stenosis.  Aorta: The aortic root and ascending aorta are structurally normal, with  no evidence of dilitation.   Venous: The inferior vena cava is dilated in size with less than 50%  respiratory variability, suggesting right atrial pressure of 15 mmHg.   IAS/Shunts: The atrial septum is grossly normal.   Additional Comments: 3D was performed not requiring image post processing  on an independent workstation and was indeterminate.   Risk Assessment/Calculations:     CHA2DS2-VASc Score = 9   This indicates a 12.2% annual risk of stroke. The patient's score is based upon: CHF History: 1 HTN History: 1 Diabetes History: 1 Stroke History: 2 Vascular Disease History: 1 Age Score: 2 Gender Score: 1             Physical Exam:   VS:  There were no vitals taken for this visit.   Wt Readings from Last 3 Encounters:  06/20/23 176 lb 6.4 oz (80 kg)  06/15/23 175 lb 6.4 oz (79.6 kg)  06/08/23 167 lb 12.8 oz (76.1 kg)     Physical Exam Vitals reviewed.  Constitutional:      Comments: Frail appearing on O2  HENT:     Head: Normocephalic.     Nose: Nose normal.  Eyes:     Pupils: Pupils are equal, round, and reactive to light.  Cardiovascular:     Rate and Rhythm: Normal rate and regular rhythm.     Pulses: Normal pulses.     Heart sounds: Normal heart sounds. No murmur heard.    No friction rub. No gallop.  Pulmonary:     Effort: Pulmonary effort is normal.     Comments: Lower lobes diminished bilaterally Abdominal:     General: Abdomen is flat.  Musculoskeletal:     Right lower leg: Edema (3+ to mid shin, swollen but non-pitting proximally) present.     Left lower leg: Edema (3+ to mid shin, swollen but non-pitting proximally) present.  Skin:    General: Skin is warm and dry.     Capillary Refill: Capillary refill takes less than 2 seconds.  Neurological:     General: No focal deficit present.     Mental Status: She is alert and oriented to person, place, and time.  Psychiatric:        Mood and Affect: Mood normal.        Behavior: Behavior normal.        Thought Content: Thought content normal.        Judgment: Judgment normal.    ASSESSMENT AND PLAN:    Assessment & Plan  Severe AS s/p TAVR (26mm Medtronic Evolute Pro+) Stable valve function on recent echocardiogram during admission. Aortic valve mean gradient measures 9.0 mmHg. Aortic valve peak gradient measures 14.1 mmHg. Aortic valve area, by VTI measures 1.92 cm.    Chronic Diastolic heart failure Treated for acute on chronic HFpEF during recent admission. TTE with LVEF 60-65%. Currently on torsemide  40 mg daily. Significant peripheral edema present, possibly exacerbated by amlodipine . Blood pressure is low. - Continue torsemide  40 mg daily - Monitor kidney function to adjust diuretic dosage - Consider additional 20 mg torsemide  based on kidney function - Stop amlodipine  to assess impact on peripheral edema - No MRA with eGFR 25. - Consider SGLT2i at subsequent follow up. Deferred during recent admission with 3 UTIs in the last 2 months.   Paroxysmal atrial fibrillation Patient with regular rate/rhythm on exam today. Not on anticoagulation due to prior bleeding risk. Amiodarone  recently  discontinued due to concern on pulmonary toxicity.  - Monitor heart rate and rhythm - Continue with rate control strategy at this time (though remains in sinus rhythm today) with Toprol  XL 25mg  daily.  Chronic hypoxic respiratory failure Chronic hypoxic respiratory failure with improvement in oxygen  saturation. Currently on 3L oxygen  via nasal cannula. Suspected interstitial lung disease secondary to amiodarone  toxicity. Continuing prednisone  taper. Oxygen  saturation was 95-96% even after being off oxygen  for two hours, indicating improvement. - Continue prednisone  taper per pulmonology - Follow up with pulmonology for inflammatory lab results  Interstitial lung disease Suspected interstitial lung disease secondary to amiodarone  toxicity. Imaging showed possible interstitial lung disease. Inflammatory labs ordered to explore non-amiodarone  causes. - Continue prednisone  taper - Follow up with pulmonology for inflammatory lab results  Stage 4 chronic kidney disease Stage 4 chronic kidney disease limiting medication options for blood pressure and diuretic management. Recent labs pending to assess current status. - Monitor kidney function with pending labs - Adjust  diuretic therapy based on kidney function results  Hypertension Hypertension with current blood pressure readings of 102/60 and 108/56. Amlodipine  may contribute to peripheral edema. Blood pressure management complicated by kidney function. Amlodipine  discontinuation is planned to evaluate its impact on edema and blood pressure. - Stop amlodipine  - Monitor blood pressure daily - Continue Toprol  XL 25mg  daily, Imdur  60mg  daily, and hydralazine  50mg  TID for blood pressure management.  Mild nonobstructive CAD Hyperlipidemia LDL goal 70mg /dL [facility] 02/7975 showed: mild non-obstructive CAD with 20-30% stenosis in prox RCA, mid to distal RCA, ramus, mid LAD, distal left main to prox LAD. 03/14/2023: HDL 30.10; LDL Cholesterol 51. -Continue Lipitor  40mg   Peripheral edema Peripheral edema in legs, possibly exacerbated by amlodipine . Swelling is significant and causing discomfort. Amlodipine  is known to cause peripheral edema, and its discontinuation may help reduce swelling. - Stop amlodipine  to assess impact on edema - Monitor swelling and adjust diuretic therapy based on kidney function  Type 2 diabetes mellitus Type 2 diabetes mellitus with no specific issues discussed during this visit.          Signed, Artist Pouch, PA-C   "

## 2023-06-20 NOTE — Patient Instructions (Signed)
 VISIT SUMMARY:  Today, we discussed your ongoing health issues, including your shortness of breath and swelling. We reviewed your recent hospitalization and current medications. We also talked about your goals of care and the importance of monitoring your condition closely.  YOUR PLAN:  -INTERSTITIAL LUNG DISEASE: Interstitial lung disease is a condition where the tissue in your lungs becomes scarred and stiff, making it hard to breathe. This may be due to the medication amiodarone . We will continue your prednisone  treatment and gradually reduce the dose.  Reduce prednisone  dose by 10 mg every 2 weeks until complete.  We will also order a chest x-ray, blood tests to rule out autoimmune causes, and schedule a CT scan and lung function test in 2-3 months.  -HEART FAILURE: Heart failure means your heart is not pumping blood as well as it should. We will monitor your fluid status and adjust your diuretics based on your kidney function tests to help manage your swelling.  -CHRONIC KIDNEY DISEASE, STAGE 3B: Chronic kidney disease stage 3B means your kidneys are moderately damaged and not working as well as they should. We will order kidney function tests and may increase your diuretic dose if your kidney function is stable.  -PAROXYSMAL ATRIAL FIBRILLATION: Paroxysmal atrial fibrillation is a type of irregular heartbeat that comes and goes. We have stopped your amiodarone  due to potential lung toxicity and will continue to monitor your condition.  -AORTIC STENOSIS, POST TAVR: Aortic stenosis is a narrowing of the heart's aortic valve, and you had a valve replacement about six years ago. We will continue to monitor your heart health.  -GOALS OF CARE: We discussed your preferences for your care, including being informed about your condition and avoiding aggressive treatments. You are currently under palliative and hospice care, and we will keep you updated on your condition.  INSTRUCTIONS:  We will  communicate your lab results and any recommendations through MyChart. Please follow up with your cardiologist next month.

## 2023-06-21 ENCOUNTER — Encounter: Payer: Self-pay | Admitting: Cardiology

## 2023-06-21 ENCOUNTER — Ambulatory Visit: Attending: Cardiology | Admitting: Cardiology

## 2023-06-21 VITALS — BP 102/60 | HR 60 | Ht 61.0 in

## 2023-06-21 DIAGNOSIS — I160 Hypertensive urgency: Secondary | ICD-10-CM | POA: Diagnosis not present

## 2023-06-21 DIAGNOSIS — I130 Hypertensive heart and chronic kidney disease with heart failure and stage 1 through stage 4 chronic kidney disease, or unspecified chronic kidney disease: Secondary | ICD-10-CM | POA: Diagnosis not present

## 2023-06-21 DIAGNOSIS — E785 Hyperlipidemia, unspecified: Secondary | ICD-10-CM | POA: Diagnosis not present

## 2023-06-21 DIAGNOSIS — J9601 Acute respiratory failure with hypoxia: Secondary | ICD-10-CM | POA: Diagnosis not present

## 2023-06-21 DIAGNOSIS — J690 Pneumonitis due to inhalation of food and vomit: Secondary | ICD-10-CM | POA: Diagnosis not present

## 2023-06-21 DIAGNOSIS — K219 Gastro-esophageal reflux disease without esophagitis: Secondary | ICD-10-CM | POA: Diagnosis not present

## 2023-06-21 DIAGNOSIS — I2510 Atherosclerotic heart disease of native coronary artery without angina pectoris: Secondary | ICD-10-CM | POA: Diagnosis not present

## 2023-06-21 DIAGNOSIS — I10 Essential (primary) hypertension: Secondary | ICD-10-CM | POA: Diagnosis not present

## 2023-06-21 DIAGNOSIS — D509 Iron deficiency anemia, unspecified: Secondary | ICD-10-CM | POA: Diagnosis not present

## 2023-06-21 DIAGNOSIS — Z952 Presence of prosthetic heart valve: Secondary | ICD-10-CM

## 2023-06-21 DIAGNOSIS — E039 Hypothyroidism, unspecified: Secondary | ICD-10-CM | POA: Diagnosis not present

## 2023-06-21 DIAGNOSIS — E7849 Other hyperlipidemia: Secondary | ICD-10-CM | POA: Diagnosis not present

## 2023-06-21 DIAGNOSIS — J9611 Chronic respiratory failure with hypoxia: Secondary | ICD-10-CM | POA: Diagnosis not present

## 2023-06-21 DIAGNOSIS — I480 Paroxysmal atrial fibrillation: Secondary | ICD-10-CM

## 2023-06-21 DIAGNOSIS — I4819 Other persistent atrial fibrillation: Secondary | ICD-10-CM | POA: Diagnosis not present

## 2023-06-21 DIAGNOSIS — M810 Age-related osteoporosis without current pathological fracture: Secondary | ICD-10-CM | POA: Diagnosis not present

## 2023-06-21 DIAGNOSIS — E1169 Type 2 diabetes mellitus with other specified complication: Secondary | ICD-10-CM

## 2023-06-21 DIAGNOSIS — N1832 Chronic kidney disease, stage 3b: Secondary | ICD-10-CM | POA: Diagnosis not present

## 2023-06-21 DIAGNOSIS — E1122 Type 2 diabetes mellitus with diabetic chronic kidney disease: Secondary | ICD-10-CM | POA: Diagnosis not present

## 2023-06-21 DIAGNOSIS — E1165 Type 2 diabetes mellitus with hyperglycemia: Secondary | ICD-10-CM

## 2023-06-21 DIAGNOSIS — E538 Deficiency of other specified B group vitamins: Secondary | ICD-10-CM | POA: Diagnosis not present

## 2023-06-21 DIAGNOSIS — N184 Chronic kidney disease, stage 4 (severe): Secondary | ICD-10-CM | POA: Diagnosis not present

## 2023-06-21 DIAGNOSIS — I5032 Chronic diastolic (congestive) heart failure: Secondary | ICD-10-CM | POA: Diagnosis not present

## 2023-06-21 DIAGNOSIS — I272 Pulmonary hypertension, unspecified: Secondary | ICD-10-CM | POA: Diagnosis not present

## 2023-06-21 DIAGNOSIS — I700 Atherosclerosis of aorta: Secondary | ICD-10-CM | POA: Diagnosis not present

## 2023-06-21 DIAGNOSIS — I5033 Acute on chronic diastolic (congestive) heart failure: Secondary | ICD-10-CM | POA: Diagnosis not present

## 2023-06-21 DIAGNOSIS — N179 Acute kidney failure, unspecified: Secondary | ICD-10-CM | POA: Diagnosis not present

## 2023-06-21 LAB — COMPREHENSIVE METABOLIC PANEL WITH GFR
ALT: 53 U/L — ABNORMAL HIGH (ref 0–35)
AST: 31 U/L (ref 0–37)
Albumin: 3.2 g/dL — ABNORMAL LOW (ref 3.5–5.2)
Alkaline Phosphatase: 48 U/L (ref 39–117)
BUN: 61 mg/dL — ABNORMAL HIGH (ref 6–23)
CO2: 34 meq/L — ABNORMAL HIGH (ref 19–32)
Calcium: 9.1 mg/dL (ref 8.4–10.5)
Chloride: 98 meq/L (ref 96–112)
Creatinine, Ser: 2.22 mg/dL — ABNORMAL HIGH (ref 0.40–1.20)
GFR: 19.84 mL/min — ABNORMAL LOW (ref >60.00)
Glucose, Bld: 183 mg/dL — ABNORMAL HIGH (ref 70–99)
Potassium: 5.4 meq/L — ABNORMAL HIGH (ref 3.5–5.1)
Sodium: 140 meq/L (ref 135–145)
Total Bilirubin: 0.7 mg/dL (ref 0.2–1.2)
Total Protein: 6.1 g/dL (ref 6.0–8.3)

## 2023-06-21 NOTE — Patient Instructions (Signed)
 Medication Instructions:   STOP TAKING AMLODIPINE  NOW  *If you need a refill on your cardiac medications before your next appointment, please call your pharmacy*    Follow-Up:  4-6 WEEKS WITH AN EXTENDER IN THE OFFICE    Other Instructions  PLEASE START CHECKING AND LOGGING YOUR BLOOD PRESSURES DAILY.  YOU CAN SEND SOME READINGS TO US  VIA MYCHART OR CALL THEM INTO THE OFFICE TO REPORT TO THE OPERATORS [phone])--PLEASE CHECK/LOG YOUR BP READINGS FOR A WEEK OR TWO AND THEN CALL/SEND THEM INTO EVAN WILLIAMS PA-C     Blood Pressure Record Sheet To take your blood pressure, you will need a blood pressure machine. You can buy a blood pressure machine (blood pressure monitor) at your clinic, drug store, or online. When choosing one, consider: An automatic monitor that has an arm cuff. A cuff that wraps snugly around your upper arm. You should be able to fit only one finger between your arm and the cuff. A device that stores blood pressure reading results. Do not choose a monitor that measures your blood pressure from your wrist or finger. Follow your health care provider's instructions for how to take your blood pressure. To use this form: Take your blood pressure medications every day These measurements should be taken when you have been at rest for at least 10-15 min Take at least 2 readings with each blood pressure check. This makes sure the results are correct. Wait 1-2 minutes between measurements. Write down the results in the spaces on this form. Keep in mind it should always be recorded systolic over diastolic. Both numbers are important.  Repeat this every day for 2-3 weeks, or as told by your health care provider.  Make a follow-up appointment with your health care provider to discuss the results.  Blood Pressure Log Date Medications taken? (Y/N) Blood Pressure Time of Day

## 2023-06-22 ENCOUNTER — Other Ambulatory Visit: Payer: Self-pay | Admitting: *Deleted

## 2023-06-22 DIAGNOSIS — I130 Hypertensive heart and chronic kidney disease with heart failure and stage 1 through stage 4 chronic kidney disease, or unspecified chronic kidney disease: Secondary | ICD-10-CM | POA: Diagnosis not present

## 2023-06-22 DIAGNOSIS — N184 Chronic kidney disease, stage 4 (severe): Secondary | ICD-10-CM | POA: Diagnosis not present

## 2023-06-22 DIAGNOSIS — I4819 Other persistent atrial fibrillation: Secondary | ICD-10-CM | POA: Diagnosis not present

## 2023-06-22 DIAGNOSIS — I2510 Atherosclerotic heart disease of native coronary artery without angina pectoris: Secondary | ICD-10-CM | POA: Diagnosis not present

## 2023-06-22 DIAGNOSIS — I700 Atherosclerosis of aorta: Secondary | ICD-10-CM | POA: Diagnosis not present

## 2023-06-22 DIAGNOSIS — J690 Pneumonitis due to inhalation of food and vomit: Secondary | ICD-10-CM | POA: Diagnosis not present

## 2023-06-22 DIAGNOSIS — E1169 Type 2 diabetes mellitus with other specified complication: Secondary | ICD-10-CM | POA: Diagnosis not present

## 2023-06-22 DIAGNOSIS — I160 Hypertensive urgency: Secondary | ICD-10-CM | POA: Diagnosis not present

## 2023-06-22 DIAGNOSIS — E538 Deficiency of other specified B group vitamins: Secondary | ICD-10-CM | POA: Diagnosis not present

## 2023-06-22 DIAGNOSIS — M810 Age-related osteoporosis without current pathological fracture: Secondary | ICD-10-CM | POA: Diagnosis not present

## 2023-06-22 DIAGNOSIS — E039 Hypothyroidism, unspecified: Secondary | ICD-10-CM | POA: Diagnosis not present

## 2023-06-22 DIAGNOSIS — E7849 Other hyperlipidemia: Secondary | ICD-10-CM | POA: Diagnosis not present

## 2023-06-22 DIAGNOSIS — I272 Pulmonary hypertension, unspecified: Secondary | ICD-10-CM | POA: Diagnosis not present

## 2023-06-22 DIAGNOSIS — K219 Gastro-esophageal reflux disease without esophagitis: Secondary | ICD-10-CM | POA: Diagnosis not present

## 2023-06-22 DIAGNOSIS — E1122 Type 2 diabetes mellitus with diabetic chronic kidney disease: Secondary | ICD-10-CM | POA: Diagnosis not present

## 2023-06-22 DIAGNOSIS — D509 Iron deficiency anemia, unspecified: Secondary | ICD-10-CM | POA: Diagnosis not present

## 2023-06-22 DIAGNOSIS — I5033 Acute on chronic diastolic (congestive) heart failure: Secondary | ICD-10-CM | POA: Diagnosis not present

## 2023-06-22 DIAGNOSIS — N179 Acute kidney failure, unspecified: Secondary | ICD-10-CM | POA: Diagnosis not present

## 2023-06-22 DIAGNOSIS — E1165 Type 2 diabetes mellitus with hyperglycemia: Secondary | ICD-10-CM | POA: Diagnosis not present

## 2023-06-22 DIAGNOSIS — J9601 Acute respiratory failure with hypoxia: Secondary | ICD-10-CM | POA: Diagnosis not present

## 2023-06-22 NOTE — Patient Instructions (Signed)
 Visit Information  Thank you for taking time to visit with me today. Please don't hesitate to contact me if I can be of assistance to you before our next scheduled telephone appointment.  Our next appointment is by telephone on Thursday 06/28/23 at 11:00 am  Please call the care guide team at [phone] if you need to cancel or reschedule your appointment.   Following are the goals we discussed today:  Patient Self Care Activities:  Attend all scheduled provider appointments Call provider office for new concerns or questions  Take medications as prescribed   call office if I gain more than 2 pounds in one day or 5 pounds in one week track weight in diary watch for swelling in feet, ankles and legs every day weigh myself daily follow rescue plan if symptoms flare-up Use assistive devices as needed to prevent falls Continue using home oxygen  as prescribed Continue pacing activity to avoid episodes of shortness of breath Continue working with the home health team that is involved in your care If you believe your condition is getting worse- contact your care providers (doctors) promptly- reaching out to your doctor early when you have concerns can prevent you from having to go to the hospital  If you are experiencing a Mental Health or Behavioral Health Crisis or need someone to talk to, please  call the Suicide and Crisis Lifeline: 988 call the [place]  National Suicide Prevention Lifeline: 917-347-5381 or TTY: (248) 591-4986 TTY 229 288 2297) to talk to a trained counselor call 1-800-273-TALK (toll free, 24 hour hotline) go to [facility] [address], [place] [phone]) call the [facility] Crisis Line: [phone] call 911   Patient verbalizes understanding of instructions and care plan provided today and agrees to view in MyChart. Active MyChart status and patient understanding of how to access instructions and care plan via MyChart  confirmed with patient.     Pratham Cassatt Mckinney Moani Weipert, RN, BSN, Media planner  Transitions of Care  [facility] [phone]: direct office

## 2023-06-22 NOTE — Transitions of Care (Post Inpatient/ED Visit) (Signed)
 Transition of Care week 3/ day # 14  Visit Note  06/22/2023  Name: Anavi Lydia MRN: [id number]          DOB: March 03, 1940  Situation: Patient enrolled in [facility] 30-day program. Visit completed with patient and her son/ caregiver Allayne Arabian by telephone.   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 Mild (25-49%) prox RCA & LAD,; Moderate (50-69%) Ostial Small RI & prox LCx.     DEPRESSION 10/08/2006   DIABETES MELLITUS, TYPE II 10/05/2006   GERD 06/24/2007   History of CVA (cerebrovascular accident) 08/10/2020   08/2020- found to be in afib with RVR, started on Eliquis  (shower of emboli from A. fib as complication of severe AS)   HYPERLIPIDEMIA 10/08/2006   HYPERTENSION 10/08/2006   INSOMNIA 09/24/2008   Left knee DJD    Nonsustained paroxysmal ventricular tachycardia (HCC) 10/2018   Post-TAVR Zio Patch: Frequent (206) runs of NSVT - Fastest 5 beats 239 bpm. Longest 9.3 Sec avg 135 bpm. (? possibly Afib w/ aberrancy)   Osteoporosis 03/10/2016   PAF (paroxysmal atrial fibrillation) (HCC)    PAF with RVR: CHA2DS2-VASc 9.  On Eliquis  and amiodarone . 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 Abel Hoe & Dr. Sherene Dilling   Severe aortic stenosis 08/2020   Progression from Mod-Severe AS to Severe AS - noted on Echo 08/2020 -> (mean gradient progressed from 34 to 42 mmHg) this was in setting of new onset A. fib RVR, acute diastolic HF and shower of emboli CVA. ->  Referred for TAVR, completed 10/26/2020   Assessment:  "I am still feeling so much better and the lung doctor and heart doctor really gave me a lot of reassurance this week; my weights are stable, my blood sugars are fine; I feel good.  Home Health is still coming; came yesterday, and coming again today.  My legs just stay swollen- but the doctors stopped the amlodipine , thinking that might help- they are always swollen.  Using my home O2 and my walker.  No real changes or concerns today."    Denies clinical concerns and sounds to be in no distress throughout [facility] 30-day program outreach call today   Patient Reported Symptoms: Cognitive Cognitive Status: Alert and oriented to person, place, and time, Insightful and able to interpret abstract concepts, Normal speech and language skills Cognitive/Intellectual Conditions Management [RPT]: None reported or documented in medical history or problem list   Health Maintenance Behaviors: Annual physical exam  Neurological Neurological Review of Symptoms: No symptoms reported    HEENT HEENT Symptoms Reported: No symptoms reported      Cardiovascular Cardiovascular Symptoms Reported: Swelling in legs or  feet Does patient have uncontrolled Hypertension?: No Cardiovascular Conditions: Hypertension, Heart failure, Dysrhythmia Cardiovascular Management Strategies: Medication therapy, Routine screening, Diet modification, Coping strategies, Fluid modification (confirms has discontinued amlodipine  as instructed during cardiology office visit 06/21/23) Do You Have a Working Readable Scale?: Yes Weight: 175 lb 12.8 oz  (79.7 kg) (home reported value 06/22/23)  Respiratory Respiratory Symptoms Reported: Shortness of breath Other Respiratory Symptoms: Reports baseline shortness of breath; continues using home O2 as prescribed 2-4 L/min continuous; reports episodes shortness of breath continue to promptly resolve with rest, brief increase in home O2 dose; sounds to be in no repiratory distress throughout TOC call today Respiratory Conditions: Shortness of breath  Endocrine Patient reports the following symptoms related to hypoglycemia or hyperglycemia : No symptoms reported Is patient diabetic?: Yes Is patient checking blood sugars at home?: Yes Endocrine Conditions: Diabetes Endocrine Management Strategies: Diet modification, Routine screening, Medication therapy, Coping strategies  Gastrointestinal Gastrointestinal Symptoms Reported: Diarrhea Additional Gastrointestinal Details: Reprts intermittent/ baseline periodic diarrhea- she attributes to "diabetes medications;" reports resolved using OTC medication prn; reports "has BM regularly" Gastrointestinal Conditions: Diarrhea Gastrointestinal Management Strategies: Medication therapy, Coping strategies Nutrition Risk Screen (CP): No indicators present  Genitourinary Genitourinary Symptoms Reported: No symptoms reported Additional Genitourinary Details: Reports "peeing fine, normal amounts; it is clear light yellow"    Integumentary Integumentary Symptoms Reported: No symptoms reported    Musculoskeletal Musculoskelatal Symptoms Reviewed: Unsteady gait Additional Musculoskeletal Details: confirmed continues to use rolling walker "all the time" as per baseline Musculoskeletal Conditions: Mobility limited Musculoskeletal Management Strategies: Medical device, Routine screening, Adequate rest, Coping strategies      Psychosocial Psychosocial Symptoms Reported: No symptoms reported         There were no vitals filed for this visit.  Medications Reviewed  Today     Reviewed by Jahaziel Francois M, RN (Registered Nurse) on 06/22/23 at 1006  Med List Status: <None>   Medication Order Taking? Sig Documenting Provider Last Dose Status Informant  acetaminophen  (TYLENOL ) 500 MG tablet [id number] No Take 500 mg by mouth every 6 (six) hours as needed for moderate pain. [provider] Taking Active Self, Child, Pharmacy Records           Med Note Mliss Anderson [place], [name]   Mon May 14, 2023  2:52 PM)    ALPRAZolam  (XANAX ) 0.25 MG tablet [id number] No Take 1 tablet (0.25 mg total) by mouth 3 (three) times daily as needed for anxiety. Roslyn Coombe, MD Taking Active Self, Child, Pharmacy Records  atorvastatin  (LIPITOR ) 40 MG tablet [id number] No TAKE 1 TABLET BY MOUTH EVERY DAY Arleen Lacer, MD Taking Active Self, Child, Pharmacy Records  blood glucose meter kit and supplies [id number] No Dispense based on patient and insurance preference. Use up to four times daily as directed. E11.9 Roslyn Coombe, MD Taking Active Self, Child, Pharmacy Records  Blood Glucose Monitoring Suppl (ONE TOUCH ULTRA 2) w/Device Suzanne Erps [id number] No Use as directed daily Roslyn Coombe, MD Taking Active Self, Child, Pharmacy Records  [employer] VITAMIN B12 1000 MCG tablet [id number] No TAKE 1 TABLET BY MOUTH EVERY DAY Roslyn Coombe, MD Taking Active Self, Child, Pharmacy Records  hydrALAZINE  (APRESOLINE ) 50 MG tablet [id number] No Take 1 tablet (50 mg total) by mouth 3 (three) times daily. Deforest Fast, MD Taking Active   insulin  glargine (LANTUS ) 100 UNIT/ML Solostar Pen [id number] No Inject 40 Units into the skin daily. Deforest Fast, MD Taking Active   Insulin  Pen Needle 32G X 4  MM MISC [id number] No Use with Lantus  pen Deforest Fast, MD Taking Active   isosorbide  mononitrate (IMDUR ) 30 MG 24 hr tablet [id number] No Take 2 tablets (60 mg total) by mouth daily. Deforest Fast, MD Taking Active   Lancets [facility] Jewelene Morton PLUS Julian) MISC [id number] No USE AS DIRECTED TEST 1-2 TIMES  A DAY Roslyn Coombe, MD Taking Active Self, Child, Pharmacy Records  levothyroxine  (SYNTHROID ) 25 MCG tablet [id number] No TAKE 1 TABLET BY MOUTH EVERY DAY Roslyn Coombe, MD Taking Active Self, Child, Pharmacy Records  metFORMIN  (GLUCOPHAGE ) 1000 MG tablet [id number] No TAKE 1 TABLET BY MOUTH IN THE AM AND 1/2 IN THE PM Roslyn Coombe, MD Taking Active Self, Child, Pharmacy Records  metoprolol  succinate (TOPROL -XL) 25 MG 24 hr tablet [id number] No Take 0.5 tablets (12.5 mg total) by mouth daily. Roslyn Coombe, MD Taking Active   mirtazapine  (REMERON ) 15 MG tablet [id number] No TAKE 1 TABLET BY MOUTH EVERYDAY AT BEDTIME Roslyn Coombe, MD Taking Active Self, Child, Pharmacy Records           Med Note [facility] [place], [name]   Mon May 14, 2023  3:12 PM) Pt adamant of taking medication. Dispense report does not support claim.  ONETOUCH ULTRA test strip [id number] No USE AS INSTRUCTED TEST 1-2 TIMES A DAY Roslyn Coombe, MD Taking Active Self, Child, Pharmacy Records  pantoprazole  (PROTONIX ) 40 MG tablet [id number] No TAKE 1 TABLET BY MOUTH TWICE A DAY Ardia Kraft, PA-C Taking Active Self, Child, Pharmacy Records  predniSONE  (DELTASONE ) 20 MG tablet [id number] No Take 2 tablets (40 mg total) by mouth daily with breakfast. Deforest Fast, MD Taking Active   solifenacin  (VESICARE ) 5 MG tablet [id number] No Take 1 tablet (5 mg total) by mouth daily. Roslyn Coombe, MD Taking Active Self, Child, Pharmacy Records  torsemide  (DEMADEX ) 20 MG tablet [id number] No Take 2 tablets (40 mg total) by mouth daily. Deforest Fast, MD Taking Active            Recommendation:   Acute PCP follow-up 06/27/23- as scheduled Specialty provider follow-up 06/26/23- as scheduled Continue to follow established plan of care  Follow Up Plan:   Telephone follow-up in 1 week- as scheduled  Plan for next week's call: Review medication adherence Review provider office visits from 06/26/23- pulmonary and 06/27/23- PCP/ labs/  medication changes, if any Review upcoming provider office visits Review home health PT/ RN visits Review daily weights since day 06/22/23-- previously reported as "between 173-175 lbs" consistently Reinforce education re: self health management of CHF/ daily weights/ safe use of home O2- 3 L/min Review blood sugars at home; use of insulin / insulin  dosing/ ensure no hypoglycemia  Pls call/ message for questions,  Arista Kettlewell Mckinney Keya Wynes, RN, BSN, CCRN Alumnus RN Care Manager  Transitions of Care  [facility] - [facility] [phone]: direct office

## 2023-06-23 LAB — ANA,IFA RA DIAG PNL W/RFLX TIT/PATN
Anti Nuclear Antibody (ANA): NEGATIVE
Cyclic Citrullin Peptide Ab: <16 U
Rheumatoid fact SerPl-aCnc: <10 [IU]/mL (ref <14)

## 2023-06-23 LAB — SJOGRENS SYNDROME-B EXTRACTABLE NUCLEAR ANTIBODY: SSB (La) (ENA) Antibody, IgG: <1 AI

## 2023-06-23 LAB — SJOGRENS SYNDROME-A EXTRACTABLE NUCLEAR ANTIBODY: SSA (Ro) (ENA) Antibody, IgG: <1 AI

## 2023-06-23 LAB — ANTI-SCLERODERMA ANTIBODY: Scleroderma (Scl-70) (ENA) Antibody, IgG: <1 AI

## 2023-06-25 ENCOUNTER — Ambulatory Visit: Payer: Self-pay | Admitting: Pulmonary Disease

## 2023-06-25 DIAGNOSIS — N179 Acute kidney failure, unspecified: Secondary | ICD-10-CM | POA: Diagnosis not present

## 2023-06-25 DIAGNOSIS — I2510 Atherosclerotic heart disease of native coronary artery without angina pectoris: Secondary | ICD-10-CM | POA: Diagnosis not present

## 2023-06-25 DIAGNOSIS — E538 Deficiency of other specified B group vitamins: Secondary | ICD-10-CM | POA: Diagnosis not present

## 2023-06-25 DIAGNOSIS — R7989 Other specified abnormal findings of blood chemistry: Secondary | ICD-10-CM

## 2023-06-25 DIAGNOSIS — I4819 Other persistent atrial fibrillation: Secondary | ICD-10-CM | POA: Diagnosis not present

## 2023-06-25 DIAGNOSIS — I130 Hypertensive heart and chronic kidney disease with heart failure and stage 1 through stage 4 chronic kidney disease, or unspecified chronic kidney disease: Secondary | ICD-10-CM | POA: Diagnosis not present

## 2023-06-25 DIAGNOSIS — I160 Hypertensive urgency: Secondary | ICD-10-CM | POA: Diagnosis not present

## 2023-06-25 DIAGNOSIS — M810 Age-related osteoporosis without current pathological fracture: Secondary | ICD-10-CM | POA: Diagnosis not present

## 2023-06-25 DIAGNOSIS — I272 Pulmonary hypertension, unspecified: Secondary | ICD-10-CM | POA: Diagnosis not present

## 2023-06-25 DIAGNOSIS — E1165 Type 2 diabetes mellitus with hyperglycemia: Secondary | ICD-10-CM | POA: Diagnosis not present

## 2023-06-25 DIAGNOSIS — J9601 Acute respiratory failure with hypoxia: Secondary | ICD-10-CM | POA: Diagnosis not present

## 2023-06-25 DIAGNOSIS — N184 Chronic kidney disease, stage 4 (severe): Secondary | ICD-10-CM | POA: Diagnosis not present

## 2023-06-25 DIAGNOSIS — I5033 Acute on chronic diastolic (congestive) heart failure: Secondary | ICD-10-CM | POA: Diagnosis not present

## 2023-06-25 DIAGNOSIS — E039 Hypothyroidism, unspecified: Secondary | ICD-10-CM | POA: Diagnosis not present

## 2023-06-25 DIAGNOSIS — J690 Pneumonitis due to inhalation of food and vomit: Secondary | ICD-10-CM | POA: Diagnosis not present

## 2023-06-25 DIAGNOSIS — K219 Gastro-esophageal reflux disease without esophagitis: Secondary | ICD-10-CM | POA: Diagnosis not present

## 2023-06-25 DIAGNOSIS — D509 Iron deficiency anemia, unspecified: Secondary | ICD-10-CM | POA: Diagnosis not present

## 2023-06-25 DIAGNOSIS — I700 Atherosclerosis of aorta: Secondary | ICD-10-CM | POA: Diagnosis not present

## 2023-06-25 DIAGNOSIS — E1169 Type 2 diabetes mellitus with other specified complication: Secondary | ICD-10-CM | POA: Diagnosis not present

## 2023-06-25 DIAGNOSIS — E1122 Type 2 diabetes mellitus with diabetic chronic kidney disease: Secondary | ICD-10-CM | POA: Diagnosis not present

## 2023-06-25 DIAGNOSIS — E7849 Other hyperlipidemia: Secondary | ICD-10-CM | POA: Diagnosis not present

## 2023-06-26 ENCOUNTER — Inpatient Hospital Stay: Admitting: Nurse Practitioner

## 2023-06-27 ENCOUNTER — Encounter: Payer: Self-pay | Admitting: Internal Medicine

## 2023-06-27 ENCOUNTER — Ambulatory Visit (INDEPENDENT_AMBULATORY_CARE_PROVIDER_SITE_OTHER): Admitting: Internal Medicine

## 2023-06-27 VITALS — BP 124/50 | HR 60 | Temp 98.5°F | Ht 61.0 in | Wt 179.8 lb

## 2023-06-27 DIAGNOSIS — I4819 Other persistent atrial fibrillation: Secondary | ICD-10-CM | POA: Diagnosis not present

## 2023-06-27 DIAGNOSIS — E1122 Type 2 diabetes mellitus with diabetic chronic kidney disease: Secondary | ICD-10-CM | POA: Diagnosis not present

## 2023-06-27 DIAGNOSIS — J9611 Chronic respiratory failure with hypoxia: Secondary | ICD-10-CM | POA: Diagnosis not present

## 2023-06-27 DIAGNOSIS — E039 Hypothyroidism, unspecified: Secondary | ICD-10-CM | POA: Diagnosis not present

## 2023-06-27 DIAGNOSIS — N179 Acute kidney failure, unspecified: Secondary | ICD-10-CM | POA: Diagnosis not present

## 2023-06-27 DIAGNOSIS — M810 Age-related osteoporosis without current pathological fracture: Secondary | ICD-10-CM | POA: Diagnosis not present

## 2023-06-27 DIAGNOSIS — E785 Hyperlipidemia, unspecified: Secondary | ICD-10-CM | POA: Diagnosis not present

## 2023-06-27 DIAGNOSIS — N184 Chronic kidney disease, stage 4 (severe): Secondary | ICD-10-CM | POA: Diagnosis not present

## 2023-06-27 DIAGNOSIS — J9601 Acute respiratory failure with hypoxia: Secondary | ICD-10-CM | POA: Diagnosis not present

## 2023-06-27 DIAGNOSIS — I700 Atherosclerosis of aorta: Secondary | ICD-10-CM | POA: Diagnosis not present

## 2023-06-27 DIAGNOSIS — D509 Iron deficiency anemia, unspecified: Secondary | ICD-10-CM | POA: Diagnosis not present

## 2023-06-27 DIAGNOSIS — I5033 Acute on chronic diastolic (congestive) heart failure: Secondary | ICD-10-CM | POA: Diagnosis not present

## 2023-06-27 DIAGNOSIS — K219 Gastro-esophageal reflux disease without esophagitis: Secondary | ICD-10-CM | POA: Diagnosis not present

## 2023-06-27 DIAGNOSIS — I272 Pulmonary hypertension, unspecified: Secondary | ICD-10-CM | POA: Diagnosis not present

## 2023-06-27 DIAGNOSIS — E1169 Type 2 diabetes mellitus with other specified complication: Secondary | ICD-10-CM | POA: Diagnosis not present

## 2023-06-27 DIAGNOSIS — Z794 Long term (current) use of insulin: Secondary | ICD-10-CM | POA: Diagnosis not present

## 2023-06-27 DIAGNOSIS — J690 Pneumonitis due to inhalation of food and vomit: Secondary | ICD-10-CM

## 2023-06-27 DIAGNOSIS — I5032 Chronic diastolic (congestive) heart failure: Secondary | ICD-10-CM

## 2023-06-27 DIAGNOSIS — I130 Hypertensive heart and chronic kidney disease with heart failure and stage 1 through stage 4 chronic kidney disease, or unspecified chronic kidney disease: Secondary | ICD-10-CM | POA: Diagnosis not present

## 2023-06-27 DIAGNOSIS — I160 Hypertensive urgency: Secondary | ICD-10-CM | POA: Diagnosis not present

## 2023-06-27 DIAGNOSIS — E7849 Other hyperlipidemia: Secondary | ICD-10-CM | POA: Diagnosis not present

## 2023-06-27 DIAGNOSIS — E1165 Type 2 diabetes mellitus with hyperglycemia: Secondary | ICD-10-CM | POA: Diagnosis not present

## 2023-06-27 DIAGNOSIS — E538 Deficiency of other specified B group vitamins: Secondary | ICD-10-CM | POA: Diagnosis not present

## 2023-06-27 DIAGNOSIS — I2510 Atherosclerotic heart disease of native coronary artery without angina pectoris: Secondary | ICD-10-CM | POA: Diagnosis not present

## 2023-06-27 MED ORDER — INSULIN GLARGINE 100 UNIT/ML SOLOSTAR PEN: 40.0000 [IU] | PEN_INJECTOR | Freq: Every day | SUBCUTANEOUS | 5 refills | Status: DC

## 2023-06-27 NOTE — Progress Notes (Signed)
 Patient ID: Mary Mccarthy, female   DOB: 1940-10-06, 83 y.o.   MRN: [id number]        Chief Complaint: follow up chf, gait disorder, dm, chronic hypoxic resp failure with possible recent amiodarone  lung toxicity, now at home with hospice and home o2 3L continuous         HPI:  Mary Mccarthy is a 83 y.o. female here with recent hospn apr 7 - may 1 with hypoxic resp failure, possible amiodarone  lung toxicity, CHF, PAF and aspirate pna.  Overall seems to have been doing well at home without fever, chills, worsening cough, pain of any kind, and hs pulm f/u next wk, also with hospice started at home with starting PT, OT. No falls,  Remains generally weak, but no other new complaints worsening.  Needs refill for new insulin  at hospital. Pt denies chest pain, increased sob or doe, wheezing, orthopnea, PND, increased LE swelling, palpitations, dizziness or syncope.  Pt denies polydipsia, polyuria, or new focal neuro s/s.    Pt denies fever, wt loss, night sweats, loss of appetite, or other constitutional symptoms   Also, Now off amlodipine  since last wk and BP stable at home as today.  Diarrhea resolved.   Wt Readings from Last 3 Encounters:  06/27/23 179 lb 12.8 oz (81.6 kg)  06/22/23 175 lb 12.8 oz (79.7 kg)  06/20/23 176 lb 6.4 oz (80 kg)   BP Readings from Last 3 Encounters:  06/27/23 (!) 124/50  06/21/23 102/60  06/20/23 (!) 118/58         Past Medical History:  Diagnosis Date   ALLERGIC RHINITIS 06/24/2007   Anemia    AVM (arteriovenous malformation) of colon    Blood transfusion without reported diagnosis 05/2015   COLONIC POLYPS, HX OF 06/24/2007   Coronary artery disease, non-occlusive 05/2015   Trop + w/ Acute Anemia =>CATH: small RI - Ostial 60%, ostial RCA 30% and dLAD 40-50%;; 9/'21: Cor Ca Score 624. Mild (25-49%) prox RCA & LAD,; Moderate (50-69%) Ostial Small RI & prox LCx.     DEPRESSION 10/08/2006   DIABETES MELLITUS, TYPE II 10/05/2006   GERD 06/24/2007    History of CVA (cerebrovascular accident) 08/10/2020   08/2020- found to be in afib with RVR, started on Eliquis  (shower of emboli from A. fib as complication of severe AS)   HYPERLIPIDEMIA 10/08/2006   HYPERTENSION 10/08/2006   INSOMNIA 09/24/2008   Left knee DJD    Nonsustained paroxysmal ventricular tachycardia (HCC) 10/2018   Post-TAVR Zio Patch: Frequent (206) runs of NSVT - Fastest 5 beats 239 bpm. Longest 9.3 Sec avg 135 bpm. (? possibly Afib w/ aberrancy)   Osteoporosis 03/10/2016   PAF (paroxysmal atrial fibrillation) (HCC)    PAF with RVR: CHA2DS2-VASc 9.  On Eliquis  and amiodarone . 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 Abel Hoe & Dr. Sherene Dilling   Severe aortic stenosis 08/2020   Progression from Mod-Severe AS to Severe AS - noted on Echo 08/2020 -> (mean gradient progressed from 34 to 42 mmHg) this was in setting of new onset A. fib RVR, acute diastolic HF and shower of emboli CVA. ->  Referred for TAVR, completed 10/26/2020   Past Surgical History:  Procedure Laterality Date   APPENDECTOMY     BIOPSY  10/21/2020   Procedure: BIOPSY;  Surgeon: Daina Drum, MD;  Location: [facility] ENDOSCOPY;  Service: Gastroenterology;;   BREAST BIOPSY     CARDIAC CATHETERIZATION N/A 05/21/2015   Procedure: Left Heart Cath and Coronary Angiography;  Surgeon: Arleen Lacer, MD;  Location: [facility] INVASIVE CV LAB;  Service: Cardiovascular;: Ost RI 60%, Ost RCA 30%, dLAD tapers to small vessel w/ 40-50%. Mildly elevated LVEDP. Normal LV Fxn.   COLONOSCOPY N/A 05/20/2015   Procedure: COLONOSCOPY;  Surgeon: Danette Duos, MD;  Location: [facility] ENDOSCOPY;  Service: Gastroenterology;  Laterality: N/A;   CORONARY CA2+ SCORE / CARDIAC CT ANGIOGRAM  10/09/2019   Calcium  score 624.  82nd percentile. Dominant RCA: Mild (25-49%) proximal stenosis-distal bifurcation into PDA  and PAV--< RPL branches.  LAD (1 major mid vessel diagonal) diffuse calcified plaque, mild proximal stenosis with minimal distal stenosis.  Small RI moderate ostial disease.  LCx-moderate mixed (50-69%) proximal stenosis.  Small dOM1 disease.  Trileaflet AoV, annular Ca2+ - probable AS   ESOPHAGOGASTRODUODENOSCOPY N/A 05/20/2015   Procedure: ESOPHAGOGASTRODUODENOSCOPY (EGD);  Surgeon: Danette Duos, MD;  Location: [facility] ENDOSCOPY;  Service: Gastroenterology;  Laterality: N/A;   ESOPHAGOGASTRODUODENOSCOPY (EGD) WITH PROPOFOL  N/A 10/21/2020   Procedure: ESOPHAGOGASTRODUODENOSCOPY (EGD) WITH PROPOFOL ;  Surgeon: Daina Drum, MD;  Location: [facility] ENDOSCOPY;  Service: Gastroenterology;  Laterality: N/A;   INTRAOPERATIVE TRANSTHORACIC ECHOCARDIOGRAM N/A 10/26/2020   Procedure: INTRAOPERATIVE TRANSTHORACIC ECHOCARDIOGRAM;  Surgeon: Abel Hoe; Location: [facility] OR; Pre-TAVR well-visualized calcified AoV mean Grad 37 mmHg, AVA 0.72 cm => Post TAVR well-positioned supra-annular 26 mm Medtronic Evolut Pro valve placed with no PVL.  Mean gradient 14 mmHg.  AVR 1.58 cm.  Normal flow to RCA and LM-LAD.  ; EF 60 to 65%.  Degenerative severe MAC w/ mild MR.   RIGHT/LEFT HEART CATH AND CORONARY ANGIOGRAPHY N/A 09/29/2020   Procedure: RIGHT/LEFT HEART CATH AND CORONARY ANGIOGRAPHY;  Surgeon: Odie Benne, MD;  Location: [facility] INVASIVE CV LAB;  Service: Cardiovascular; pre-TAVR:  pRCA 20%, m-dRCA 30%. D LM-pLAD 20%. RI 30%, mLAD 20%.  Severe AS with mean gradient measured at 52.8 mmHg.   TONSILLECTOMY     TRANSCATHETER AORTIC VALVE REPLACEMENT, TRANSFEMORAL N/A 10/26/2020   Procedure: TRANSCATHETER AORTIC VALVE REPLACEMENT, TRANSFEMORAL;  Surgeon: Odie Benne, MD;  Location: [facility] OR;  Service: Open Heart Surgery;; Medtronic Evolut-Pro + (size 26 mm, model # EVPROPLUS -26US, serial # [id number]); transient high-grade AV block noted so PPM left in place.   TRANSTHORACIC ECHOCARDIOGRAM  03/17/2019   a)  03/2019: EF 60 to 65%.  Moderate LVH.  GRII DD.  Mod-Severe AS (m grad 36 mmHg, peak 59 mmHg); b) 09/2019: EF 65-70%, No RWMA. Gr1 DD/hi LAP, Mild hi PAP. Mod LA Dil. MOD AS (mean Grad 34.5 mmHg).  STABLE   TRANSTHORACIC ECHOCARDIOGRAM  08/27/2020   Admitted for CVA/Afib RVR & CHF (Progression to SEVERE SYMPTOMATIC AS):  Severe Calcific Aortic Stenosis (VTI AVA estimated 0.86 cm, mean gradient 42 mmHg, V-max 4.36 m/s).  EF 55 to 60%.  Severe concentric LVH.  Unable to determine diastolic parameters.  Moderately elevated PAP.  Mild LA dilation.  Mild circumferential pericardial effusion.  Trivial MR.  Severe MAC.   TRANSTHORACIC ECHOCARDIOGRAM  11/25/2020   1 Month s/p TAVR: EF 55 to 60%.  No R WMA.  GR 1 DD.  Elevated LVEDP.  Normal RV size with mildly elevated RVP estimated 44 mmHg.  Oscillating density in the RV suspect calcified chordal apparatus versus calcified thrombus.  Moderate LA dilation.  Normal IVC/RA P => Well-positioned 26 mm Medtronic Evolut Pro THVwith mean  AOV gradient 11 mmHg.  Trivial PVL   TRANSTHORACIC ECHOCARDIOGRAM  11/02/2020   a) Day 1 Post-op TAVR 10/27/20:  Well-Positioned Supra Annular Medtronic Evolut Pro THP.  Mean gradient 12 mmHg, peak 24 mmHg.  AVA 1.8 cm.  Trivial PVL.  EF 60 to 65%.  Normal LV function.  Mild LVH.  Severe LA dilation.  Mild RA dilation.  Severe MAC;; b) 11/02/20: Normal structure and function of the aortic valve prosthesis.  Mean gradient 9 mmHg (otherwise stable)   TUBAL LIGATION      reports that she has never smoked. She has been exposed to tobacco smoke. She has never used smokeless tobacco. She reports that she does not drink alcohol and does not use drugs. family history includes Alcohol abuse in an other family member; Arrhythmia in her sister; Arthritis in an other family member; CAD in her sister; Cancer in her mother; Depression in an other family member; Diabetes in an other family member; Heart attack (age of onset: 5) in her sister; Heart  disease in an other family member; Hyperlipidemia in an other family member; Hypertension in an other family member; Stroke in an other family member. Allergies  Allergen Reactions   Ibuprofen Other (See Comments)    Bleeding events   Amiodarone  Other (See Comments)    Lung toxicity   Current Outpatient Medications on File Prior to Visit  Medication Sig Dispense Refill   acetaminophen  (TYLENOL ) 500 MG tablet Take 500 mg by mouth every 6 (six) hours as needed for moderate pain.     ALPRAZolam  (XANAX ) 0.25 MG tablet Take 1 tablet (0.25 mg total) by mouth 3 (three) times daily as needed for anxiety. 90 tablet 2   atorvastatin  (LIPITOR ) 40 MG tablet TAKE 1 TABLET BY MOUTH EVERY DAY 90 tablet 3   blood glucose meter kit and supplies Dispense based on patient and insurance preference. Use up to four times daily as directed. E11.9 1 each 0   Blood Glucose Monitoring Suppl (ONE TOUCH ULTRA 2) w/Device KIT Use as directed daily 1 each 0   [employer] VITAMIN B12 1000 MCG tablet TAKE 1 TABLET BY MOUTH EVERY DAY 90 tablet 3   hydrALAZINE  (APRESOLINE ) 50 MG tablet Take 1 tablet (50 mg total) by mouth 3 (three) times daily. 90 tablet 0   Insulin  Pen Needle 32G X 4 MM MISC Use with Lantus  pen 100 each 0   isosorbide  mononitrate (IMDUR ) 30 MG 24 hr tablet Take 2 tablets (60 mg total) by mouth daily. 60 tablet 0   Lancets (ONETOUCH DELICA PLUS LANCET33G) MISC USE AS DIRECTED TEST 1-2 TIMES A DAY 100 each 23   levothyroxine  (SYNTHROID ) 25 MCG tablet TAKE 1 TABLET BY MOUTH EVERY DAY 90 tablet 2   metoprolol  succinate (TOPROL -XL) 25 MG 24 hr tablet Take 0.5 tablets (12.5 mg total) by mouth daily. 45 tablet 3   mirtazapine  (REMERON ) 15 MG tablet TAKE 1 TABLET BY MOUTH EVERYDAY AT BEDTIME 90 tablet 3   ONETOUCH ULTRA test strip USE AS INSTRUCTED TEST 1-2 TIMES A DAY 100 strip 23   pantoprazole  (PROTONIX ) 40 MG tablet TAKE 1 TABLET BY MOUTH TWICE A DAY 180 tablet 1   predniSONE  (DELTASONE ) 20 MG tablet Take 2 tablets (40  mg total) by mouth daily with breakfast. (Patient taking differently: Take 30 mg by mouth daily with breakfast. Tapering down to discontinue this medication. Currently on the 1.5 tablets.) 60 tablet 1   solifenacin  (VESICARE ) 5 MG tablet Take 1 tablet (5 mg  total) by mouth daily. 90 tablet 3   torsemide  (DEMADEX ) 20 MG tablet Take 2 tablets (40 mg total) by mouth daily. 60 tablet 1   No current facility-administered medications on file prior to visit.        ROS:  All others reviewed and negative.  Objective        PE:  BP (!) 124/50 (BP Location: Left Arm, Patient Position: Sitting, Cuff Size: Normal)   Pulse 60   Temp 98.5 F (36.9 C) (Oral)   Ht 5\' 1"  (1.549 m)   Wt 179 lb 12.8 oz (81.6 kg)   SpO2 97%   BMI 33.97 kg/m                 Constitutional: Pt appears in NAD               HENT: Head: NCAT.                Right Ear: External ear normal.                 Left Ear: External ear normal.                Eyes: . Pupils are equal, round, and reactive to light. Conjunctivae and EOM are normal               Nose: without d/c or deformity               Neck: Neck supple. Gross normal ROM               Cardiovascular: Normal rate and regular rhythm.                 Pulmonary/Chest: Effort normal and breath sounds without rales or wheezing.                Abd:  Soft, NT, ND, + BS, no organomegaly               Neurological: Pt is alert. At baseline orientation, motor grossly intact               Skin: Skin is warm. No rashes, no other new lesions, LE edema - trace bilateral               Psychiatric: Pt behavior is normal without agitation   Micro: none  Cardiac tracings I have personally interpreted today:  none  Pertinent Radiological findings (summarize): none   Lab Results  Component Value Date   WBC 7.7 06/05/2023   HGB 8.8 (L) 06/05/2023   HCT 27.2 (L) 06/05/2023   PLT 252 06/05/2023   GLUCOSE 183 (H) 06/20/2023   CHOL 102 03/14/2023   TRIG 102.0 03/14/2023   HDL  30.10 (L) 03/14/2023   LDLCALC 51 03/14/2023   ALT 53 (H) 06/20/2023   AST 31 06/20/2023   NA 140 06/20/2023   K 5.4 No hemolysis seen (H) 06/20/2023   CL 98 06/20/2023   CREATININE 2.22 (H) 06/20/2023   BUN 61 (H) 06/20/2023   CO2 34 (H) 06/20/2023   TSH 5.654 (H) 05/14/2023   INR 1.7 (H) 11/01/2020   HGBA1C 6.5 03/14/2023   MICROALBUR 1.3 03/15/2022   Assessment/Plan:  Tanyiah Laurich is a 83 y.o. White or Caucasian [1] female with  has a past medical history of ALLERGIC RHINITIS (06/24/2007), Anemia, AVM (arteriovenous malformation) of colon, Blood transfusion without reported diagnosis (05/2015), COLONIC POLYPS, HX OF (06/24/2007), Coronary artery disease, non-occlusive (  05/2015), DEPRESSION (10/08/2006), DIABETES MELLITUS, TYPE II (10/05/2006), GERD (06/24/2007), History of CVA (cerebrovascular accident) (08/10/2020), HYPERLIPIDEMIA (10/08/2006), HYPERTENSION (10/08/2006), INSOMNIA (09/24/2008), Left knee DJD, Nonsustained paroxysmal ventricular tachycardia (HCC) (10/2018), Osteoporosis (03/10/2016), PAF (paroxysmal atrial fibrillation) (HCC), PAF with RVR: CHA2DS2-VASc 9.  On Eliquis  and amiodarone . (08/11/2020), PEPTIC ULCER DISEASE (10/08/2006), S/P TAVR (transcatheter aortic valve replacement) (10/26/2020), and Severe aortic stenosis (08/2020).  Aspiration pneumonia (HCC) Clinically resolved,  to f/u any worsening symptoms or concerns  CHF (congestive heart failure), NYHA class II, chronic, diastolic (HCC) Volume stable, cont current med tx  Chronic hypoxic respiratory failure (HCC) Now on home 02 3L continuous, hospice started, and to continue PT, OT for general debility  Type 2 diabetes mellitus with hyperlipidemia (HCC) Tolerating insulin  well without low sugars ; cont current med tx except d/c metformin  due to renal dysfunction, and tolerate A1c up to 7.5  Lab Results  Component Value Date   HGBA1C 6.5 03/14/2023   Followup: Return in about 3 months (around  09/27/2023).  Rosalia Colonel, MD 06/28/2023 6:50 AM [facility] Internal Medicine

## 2023-06-27 NOTE — Patient Instructions (Addendum)
 Ok for insulin  refill today  Ok to STOP the metformin  to avoid low sugars and kidney related problems  Please continue all other medications as before, and refills have been done if requested.  Please have the pharmacy call with any other refills you may need.  Please keep your appointments with your specialists as you may have planned - home hospice, PT, OT , pulmonary next wk, and cardiology jun 3, as well as lab tests next wk as planned  Continue to watch your daily weights and breathing and oxygen  level at home  Please make an Appointment to return in 3 months, or sooner if needed

## 2023-06-28 ENCOUNTER — Other Ambulatory Visit: Payer: Self-pay | Admitting: *Deleted

## 2023-06-28 ENCOUNTER — Encounter: Payer: Self-pay | Admitting: Internal Medicine

## 2023-06-28 DIAGNOSIS — J9601 Acute respiratory failure with hypoxia: Secondary | ICD-10-CM | POA: Diagnosis not present

## 2023-06-28 DIAGNOSIS — J9611 Chronic respiratory failure with hypoxia: Secondary | ICD-10-CM | POA: Insufficient documentation

## 2023-06-28 DIAGNOSIS — J690 Pneumonitis due to inhalation of food and vomit: Secondary | ICD-10-CM | POA: Diagnosis not present

## 2023-06-28 DIAGNOSIS — I2510 Atherosclerotic heart disease of native coronary artery without angina pectoris: Secondary | ICD-10-CM | POA: Diagnosis not present

## 2023-06-28 DIAGNOSIS — I700 Atherosclerosis of aorta: Secondary | ICD-10-CM | POA: Diagnosis not present

## 2023-06-28 DIAGNOSIS — N179 Acute kidney failure, unspecified: Secondary | ICD-10-CM | POA: Diagnosis not present

## 2023-06-28 DIAGNOSIS — K219 Gastro-esophageal reflux disease without esophagitis: Secondary | ICD-10-CM | POA: Diagnosis not present

## 2023-06-28 DIAGNOSIS — E039 Hypothyroidism, unspecified: Secondary | ICD-10-CM | POA: Diagnosis not present

## 2023-06-28 DIAGNOSIS — I130 Hypertensive heart and chronic kidney disease with heart failure and stage 1 through stage 4 chronic kidney disease, or unspecified chronic kidney disease: Secondary | ICD-10-CM | POA: Diagnosis not present

## 2023-06-28 DIAGNOSIS — E7849 Other hyperlipidemia: Secondary | ICD-10-CM | POA: Diagnosis not present

## 2023-06-28 DIAGNOSIS — I272 Pulmonary hypertension, unspecified: Secondary | ICD-10-CM | POA: Diagnosis not present

## 2023-06-28 DIAGNOSIS — M810 Age-related osteoporosis without current pathological fracture: Secondary | ICD-10-CM | POA: Diagnosis not present

## 2023-06-28 DIAGNOSIS — E1122 Type 2 diabetes mellitus with diabetic chronic kidney disease: Secondary | ICD-10-CM | POA: Diagnosis not present

## 2023-06-28 DIAGNOSIS — D509 Iron deficiency anemia, unspecified: Secondary | ICD-10-CM | POA: Diagnosis not present

## 2023-06-28 DIAGNOSIS — E1165 Type 2 diabetes mellitus with hyperglycemia: Secondary | ICD-10-CM | POA: Diagnosis not present

## 2023-06-28 DIAGNOSIS — I4819 Other persistent atrial fibrillation: Secondary | ICD-10-CM | POA: Diagnosis not present

## 2023-06-28 DIAGNOSIS — I160 Hypertensive urgency: Secondary | ICD-10-CM | POA: Diagnosis not present

## 2023-06-28 DIAGNOSIS — E1169 Type 2 diabetes mellitus with other specified complication: Secondary | ICD-10-CM | POA: Diagnosis not present

## 2023-06-28 DIAGNOSIS — E538 Deficiency of other specified B group vitamins: Secondary | ICD-10-CM | POA: Diagnosis not present

## 2023-06-28 DIAGNOSIS — N184 Chronic kidney disease, stage 4 (severe): Secondary | ICD-10-CM | POA: Diagnosis not present

## 2023-06-28 DIAGNOSIS — I5033 Acute on chronic diastolic (congestive) heart failure: Secondary | ICD-10-CM | POA: Diagnosis not present

## 2023-06-28 NOTE — Assessment & Plan Note (Signed)
 Now on home 02 3L continuous, hospice started, and to continue PT, OT for general debility

## 2023-06-28 NOTE — Assessment & Plan Note (Signed)
 Clinically resolved,  to f/u any worsening symptoms or concerns

## 2023-06-28 NOTE — Assessment & Plan Note (Addendum)
 Tolerating insulin  well without low sugars ; cont current med tx except d/c metformin  due to renal dysfunction, and tolerate A1c up to 7.5  Lab Results  Component Value Date   HGBA1C 6.5 03/14/2023

## 2023-06-28 NOTE — Assessment & Plan Note (Signed)
Volume stable, cont current med tx

## 2023-06-28 NOTE — Transitions of Care (Post Inpatient/ED Visit) (Signed)
 Transition of Care week 4/ day #20  Visit Note  06/28/2023  Name: Mary Mccarthy MRN: [id number]          DOB: 1940-08-25  Situation: Patient enrolled in [facility] 30-day program. Visit completed with patient by telephone.   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 Mild (25-49%) prox RCA & LAD,; Moderate (50-69%) Ostial Small RI & prox LCx.     DEPRESSION 10/08/2006   DIABETES MELLITUS, TYPE II 10/05/2006   GERD 06/24/2007   History of CVA (cerebrovascular accident) 08/10/2020   08/2020- found to be in afib with RVR, started on Eliquis  (shower of emboli from A. fib as complication of severe AS)   HYPERLIPIDEMIA 10/08/2006   HYPERTENSION 10/08/2006   INSOMNIA 09/24/2008   Left knee DJD    Nonsustained paroxysmal ventricular tachycardia (HCC) 10/2018   Post-TAVR Zio Patch: Frequent (206) runs of NSVT - Fastest 5 beats 239 bpm. Longest 9.3 Sec avg 135 bpm. (? possibly Afib w/ aberrancy)   Osteoporosis 03/10/2016   PAF (paroxysmal atrial fibrillation) (HCC)    PAF with RVR: CHA2DS2-VASc 9.  On Eliquis  and amiodarone . 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 Abel Hoe & Dr. Sherene Dilling   Severe aortic stenosis 08/2020   Progression from Mod-Severe AS to Severe AS - noted on Echo 08/2020 -> (mean gradient progressed from 34 to 42 mmHg) this was in setting of new onset A. fib RVR, acute diastolic HF and shower of emboli CVA. ->  Referred for TAVR, completed 10/26/2020   Assessment:  "I am doing okay, I guess. I saw Dr. Autry Legions yesterday and he gave me a good report.  The pulmonary doctor cancelled my appointment with him on 06/26/23- said that my kidney's are bad and I he was sending me to a kidney doctor.  I have gained weight and the swelling in my legs is worse- going up into my thighs, but Dr. Autry Legions and the home health nurse yesterday didn't seem to be very concerned about it, so I think I must be okay.  I am not on hospice care- I am on palliative care, they saw me 2 weeks ago.  My breathing is okay right now, but I will think about what you have said and maybe call Dr. Addie Holstein about my weight gain and my increased swelling;"   Denies clinical concerns and sounds to be in no distress throughout [facility] 30-day program outreach call today-- however, reports "my legs are still very swollen, and they are getting more swollen, it is going further up into my thighs- Patient also reports weight gain from last TOC call 06/22/23 of approximately 7- 8 lbs (  home reported values)   Advised to contact cardiology provider for specific instructions, given recent verified worsening of kidney function; care coordination outreach also placed on patient's behalf, as I am not confident she will contact cardiologist as recommended  Patient Reported Symptoms: Cognitive Cognitive Status: Alert and oriented to person, place, and time, Insightful and able to interpret abstract concepts, Normal speech and language skills Cognitive/Intellectual Conditions Management [RPT]: None reported or documented in medical history or problem list   Health  Maintenance Behaviors: Annual physical exam, Stress management Health Facilitated by: Rest  Neurological Neurological Review of Symptoms: No symptoms reported Neurological Management Strategies: Routine screening  HEENT HEENT Symptoms Reported: No symptoms reported      Cardiovascular Cardiovascular Symptoms Reported: Swelling in legs or feet (see comments under Respiratory system re: weight gain/ LE swelling) Does patient have uncontrolled Hypertension?: No Cardiovascular Conditions: Heart failure, Hypertension, Dysrhythmia Cardiovascular Management Strategies: Fluid modification, Weight management, Routine screening, Medication therapy, Coping strategies, Adequate rest Do You Have a Working Readable Scale?: Yes Weight: 181 lb 1.6 oz (82.1 kg) (home reported weight from 06/28/23)  Respiratory Respiratory Symptoms Reported: Shortness of breath Other Respiratory Symptoms: Reports ongoing/ baseline chronic shortness of breath without change or increased symptoms from baseline; confirms continues using home O2 at 3 L/min "all the time;" reports "my legs are still very swollen, and they are getting more swollen, it is going further up into my thighs- Dr. Autry Legions did not seem to be concerned about it yesterday, and the home health nurse also saw them yesterday;"  Patient also reports weight gain from last TOC call 06/22/23 of approximately 7- 8 lbs (home reported values) Respiratory Conditions: Shortness of breath  Endocrine Patient reports the following symptoms related to hypoglycemia or hyperglycemia : No symptoms reported Is patient diabetic?: Yes Is patient checking blood sugars at home?: Yes Endocrine Conditions: Diabetes Endocrine Management Strategies: Routine screening, Medication therapy, Medical device, Coping strategies, Diet modification, Adequate rest  Gastrointestinal Gastrointestinal Symptoms Reported: No symptoms reported Additional Gastrointestinal Details: Reports having "normal and  regular BM's"      Genitourinary Genitourinary Symptoms Reported: No symptoms reported Additional Genitourinary Details: Reports "I am peeing frequently, and it is either yellow or dark yellow, it is clear;" Reinforced need to stay hydrated without overloading fluid intake Genitourinary Conditions: Chronic kidney disease Genitourinary Management Strategies: Medication therapy  Integumentary Integumentary Symptoms Reported: No symptoms reported    Musculoskeletal Musculoskelatal Symptoms Reviewed: Unsteady gait, Difficulty walking Additional Musculoskeletal Details: confirmed continues using walker "all the time" Musculoskeletal Conditions: Mobility limited Musculoskeletal Management Strategies: Medical device, Routine screening, Coping strategies, Adequate rest      Psychosocial Psychosocial Symptoms Reported: No symptoms reported         There were no vitals filed for this visit.  Medications Reviewed Today     Reviewed by Jaxsen Bernhart M, RN (Registered Nurse) on 06/28/23 at 1108  Med List Status: <None>   Medication Order Taking? Sig Documenting Provider Last Dose Status Informant  acetaminophen  (TYLENOL ) 500 MG tablet [id number] No Take 500 mg by mouth every 6 (six) hours as needed for moderate pain. [provider] Taking Active Self, Child, Pharmacy Records           Med Note Mliss Anderson [place], [name]   Mon May 14, 2023  2:52 PM)    ALPRAZolam  (XANAX ) 0.25 MG tablet [id number] No Take 1 tablet (0.25 mg total) by mouth 3 (three) times daily as needed for anxiety. Roslyn Coombe, MD Taking Active  Self, Child, Pharmacy Records  atorvastatin  (LIPITOR ) 40 MG tablet [id number] No TAKE 1 TABLET BY MOUTH EVERY DAY Arleen Lacer, MD Taking Active Self, Child, Pharmacy Records  blood glucose meter kit and supplies [id number] No Dispense based on patient and insurance preference. Use up to four times daily as directed. E11.9 Roslyn Coombe, MD Taking Active Self, Child, Pharmacy  Records  Blood Glucose Monitoring Suppl (ONE TOUCH ULTRA 2) w/Device KIT [id number] No Use as directed daily Roslyn Coombe, MD Taking Active Self, Child, Pharmacy Records  [facility] VITAMIN B12 1000 MCG tablet [id number] No TAKE 1 TABLET BY MOUTH EVERY DAY Roslyn Coombe, MD Taking Active Self, Child, Pharmacy Records  hydrALAZINE  (APRESOLINE ) 50 MG tablet [id number] No Take 1 tablet (50 mg total) by mouth 3 (three) times daily. Deforest Fast, MD Taking Active   insulin  glargine (LANTUS ) 100 UNIT/ML Solostar Pen [id number]  Inject 40 Units into the skin daily. Roslyn Coombe, MD  Active   Insulin  Pen Needle 32G X 4 MM MISC [id number] No Use with Lantus  pen Deforest Fast, MD Taking Active   isosorbide  mononitrate (IMDUR ) 30 MG 24 hr tablet [id number] No Take 2 tablets (60 mg total) by mouth daily. Deforest Fast, MD Taking Active   Lancets [facility] Jewelene Morton PLUS [place]) MISC [id number] No USE AS DIRECTED TEST 1-2 TIMES A DAY Roslyn Coombe, MD Taking Active Self, Child, Pharmacy Records  levothyroxine  (SYNTHROID ) 25 MCG tablet [id number] No TAKE 1 TABLET BY MOUTH EVERY DAY Roslyn Coombe, MD Taking Active Self, Child, Pharmacy Records  metoprolol  succinate (TOPROL -XL) 25 MG 24 hr tablet [id number] No Take 0.5 tablets (12.5 mg total) by mouth daily. Roslyn Coombe, MD Taking Active   mirtazapine  (REMERON ) 15 MG tablet [id number] No TAKE 1 TABLET BY MOUTH EVERYDAY AT BEDTIME Roslyn Coombe, MD Taking Active Self, Child, Pharmacy Records           Med Note [facility] [place], [name]   Mon May 14, 2023  3:12 PM) Pt adamant of taking medication. Dispense report does not support claim.  ONETOUCH ULTRA test strip [id number] No USE AS INSTRUCTED TEST 1-2 TIMES A DAY Roslyn Coombe, MD Taking Active Self, Child, Pharmacy Records  pantoprazole  (PROTONIX ) 40 MG tablet [id number] No TAKE 1 TABLET BY MOUTH TWICE A DAY Ardia Kraft, PA-C Taking Active Self, Child, Pharmacy Records  predniSONE  (DELTASONE ) 20 MG tablet [id number]  No Take 2 tablets (40 mg total) by mouth daily with breakfast.  Patient taking differently: Take 30 mg by mouth daily with breakfast. Tapering down to discontinue this medication. Currently on the 1.5 tablets.   Deforest Fast, MD Taking Active   solifenacin  (VESICARE ) 5 MG tablet [id number] No Take 1 tablet (5 mg total) by mouth daily. Roslyn Coombe, MD Taking Active Self, Child, Pharmacy Records  torsemide  (DEMADEX ) 20 MG tablet [id number] No Take 2 tablets (40 mg total) by mouth daily. Deforest Fast, MD Taking Active            Recommendation:   Specialty provider follow-up cardiology provider- as scheduled 07/10/23- or sooner if needed Patient advised to contact cardiology provider to report increased weights at home and increased bilateral swelling- care coordination outreach also completed  Follow Up Plan:   Telephone follow-up in 1 week  Plan for next week's call: Review medication adherence Review provider office visits from 06/26/23- pulmonary and 06/27/23- PCP/ labs/ medication changes, if any Review upcoming provider office visits Review home health PT/ RN visits  Review daily weights/ status of lower extremity swelling since last outreach-- previously reported as "between 173-175 lbs" (06/22/23) and then 181.5 range on 06/28/23 Did patient contact cardiology provider/ follow up outreach from care coordination sent to providers 06/28/23 ? Reinforce education re: self health management of CHF/ daily weights/ safe use of home O2- 3 L/min Review blood sugars at home; use of insulin / insulin  dosing/ ensure no hypoglycemia  Pls call/ message for questions,  Skylar Flynt Mckinney Allexis Bordenave, RN, BSN, CCRN Alumnus RN Care Manager  Transitions of Care  [facility] - [facility] [phone]: direct office

## 2023-06-28 NOTE — Patient Instructions (Signed)
 Visit Information  Thank you for taking time to visit with me today. Please don't hesitate to contact me if I can be of assistance to you before our next scheduled telephone appointment.  Our next appointment is by telephone on Wednesday 07/04/23 at 11:00 am  Please call the care guide team at [phone] if you need to cancel or reschedule your appointment.   Following are the goals we discussed today:  Patient Self Care Activities:  Attend all scheduled provider appointments Call provider office for new concerns or questions  Take medications as prescribed   call office if I gain more than 2 pounds in one day or 5 pounds in one week track weight in diary watch for swelling in feet, ankles and legs every day weigh myself daily follow rescue plan if symptoms flare-up Use assistive devices as needed to prevent falls Continue using home oxygen  as prescribed Continue pacing activity to avoid episodes of shortness of breath Continue working with the home health team that is involved in your care If you believe your condition is getting worse- contact your care providers (doctors) promptly- reaching out to your doctor early when you have concerns can prevent you from having to go to the hospital  If you are experiencing a Mental Health or Behavioral Health Crisis or need someone to talk to, please  call the Suicide and Crisis Lifeline: 988 call the [place]  National Suicide Prevention Lifeline: (952)730-4665 or TTY: (320)752-4771 TTY 9152745543) to talk to a trained counselor call 1-800-273-TALK (toll free, 24 hour hotline) go to [facility] [address], [place] [phone]) call the [facility] Crisis Line: [phone]) [phone] call 911   Patient verbalizes understanding of instructions and care plan provided today and agrees to view in MyChart. Active MyChart status and patient understanding of how to access instructions and care plan via MyChart  confirmed with patient.     Mary Mccarthy Mary Marra Fraga, RN, BSN, Media planner  Transitions of Care  [facility] - [facility] [phone]: direct office

## 2023-07-01 DIAGNOSIS — J9610 Chronic respiratory failure, unspecified whether with hypoxia or hypercapnia: Secondary | ICD-10-CM | POA: Diagnosis not present

## 2023-07-03 ENCOUNTER — Telehealth: Payer: Self-pay | Admitting: Internal Medicine

## 2023-07-03 DIAGNOSIS — I5033 Acute on chronic diastolic (congestive) heart failure: Secondary | ICD-10-CM | POA: Diagnosis not present

## 2023-07-03 DIAGNOSIS — I130 Hypertensive heart and chronic kidney disease with heart failure and stage 1 through stage 4 chronic kidney disease, or unspecified chronic kidney disease: Secondary | ICD-10-CM | POA: Diagnosis not present

## 2023-07-03 DIAGNOSIS — E7849 Other hyperlipidemia: Secondary | ICD-10-CM | POA: Diagnosis not present

## 2023-07-03 DIAGNOSIS — K219 Gastro-esophageal reflux disease without esophagitis: Secondary | ICD-10-CM | POA: Diagnosis not present

## 2023-07-03 DIAGNOSIS — N184 Chronic kidney disease, stage 4 (severe): Secondary | ICD-10-CM | POA: Diagnosis not present

## 2023-07-03 DIAGNOSIS — I4819 Other persistent atrial fibrillation: Secondary | ICD-10-CM | POA: Diagnosis not present

## 2023-07-03 DIAGNOSIS — J690 Pneumonitis due to inhalation of food and vomit: Secondary | ICD-10-CM | POA: Diagnosis not present

## 2023-07-03 DIAGNOSIS — I160 Hypertensive urgency: Secondary | ICD-10-CM | POA: Diagnosis not present

## 2023-07-03 DIAGNOSIS — E1122 Type 2 diabetes mellitus with diabetic chronic kidney disease: Secondary | ICD-10-CM | POA: Diagnosis not present

## 2023-07-03 DIAGNOSIS — J9601 Acute respiratory failure with hypoxia: Secondary | ICD-10-CM | POA: Diagnosis not present

## 2023-07-03 DIAGNOSIS — E039 Hypothyroidism, unspecified: Secondary | ICD-10-CM | POA: Diagnosis not present

## 2023-07-03 DIAGNOSIS — E1165 Type 2 diabetes mellitus with hyperglycemia: Secondary | ICD-10-CM | POA: Diagnosis not present

## 2023-07-03 DIAGNOSIS — D509 Iron deficiency anemia, unspecified: Secondary | ICD-10-CM | POA: Diagnosis not present

## 2023-07-03 DIAGNOSIS — I700 Atherosclerosis of aorta: Secondary | ICD-10-CM | POA: Diagnosis not present

## 2023-07-03 DIAGNOSIS — I272 Pulmonary hypertension, unspecified: Secondary | ICD-10-CM | POA: Diagnosis not present

## 2023-07-03 DIAGNOSIS — E538 Deficiency of other specified B group vitamins: Secondary | ICD-10-CM | POA: Diagnosis not present

## 2023-07-03 DIAGNOSIS — M810 Age-related osteoporosis without current pathological fracture: Secondary | ICD-10-CM | POA: Diagnosis not present

## 2023-07-03 DIAGNOSIS — I2510 Atherosclerotic heart disease of native coronary artery without angina pectoris: Secondary | ICD-10-CM | POA: Diagnosis not present

## 2023-07-03 DIAGNOSIS — E1169 Type 2 diabetes mellitus with other specified complication: Secondary | ICD-10-CM | POA: Diagnosis not present

## 2023-07-03 DIAGNOSIS — N179 Acute kidney failure, unspecified: Secondary | ICD-10-CM | POA: Diagnosis not present

## 2023-07-03 NOTE — Telephone Encounter (Signed)
 Copied from CRM [id number]. Topic: Clinical - Medication Refill >> Jul 03, 2023 10:26 AM Jim Motts C wrote: Medication:  torsemide  (DEMADEX ) 20 MG tablet [[id number]] insulin  glargine (LANTUS ) 100 UNIT/ML Solostar Pen [[id number]]  Has the patient contacted their pharmacy? Yes (Agent: If no, request that the patient contact the pharmacy for the refill. If patient does not wish to contact the pharmacy document the reason why and proceed with request.) (Agent: If yes, when and what did the pharmacy advise?)  This is the patient's preferred pharmacy:  [id number] - [place], [place] - [address] AT [facility] [address] [place] [address] Phone: [phone] Fax: [phone]  Is this the correct pharmacy for this prescription? Yes If no, delete pharmacy and type the correct one.   Has the prescription been filled recently? No  Is the patient out of the medication? Yes  Has the patient been seen for an appointment in the last year OR does the patient have an upcoming appointment? Yes  Can we respond through MyChart? Yes  Agent: Please be advised that Rx refills may take up to 3 business days. We ask that you follow-up with your pharmacy.

## 2023-07-04 ENCOUNTER — Encounter: Payer: Self-pay | Admitting: *Deleted

## 2023-07-04 ENCOUNTER — Telehealth: Payer: Self-pay | Admitting: *Deleted

## 2023-07-05 ENCOUNTER — Other Ambulatory Visit: Payer: Self-pay | Admitting: *Deleted

## 2023-07-05 DIAGNOSIS — E7849 Other hyperlipidemia: Secondary | ICD-10-CM | POA: Diagnosis not present

## 2023-07-05 DIAGNOSIS — I160 Hypertensive urgency: Secondary | ICD-10-CM | POA: Diagnosis not present

## 2023-07-05 DIAGNOSIS — K219 Gastro-esophageal reflux disease without esophagitis: Secondary | ICD-10-CM | POA: Diagnosis not present

## 2023-07-05 DIAGNOSIS — I2510 Atherosclerotic heart disease of native coronary artery without angina pectoris: Secondary | ICD-10-CM | POA: Diagnosis not present

## 2023-07-05 DIAGNOSIS — I272 Pulmonary hypertension, unspecified: Secondary | ICD-10-CM | POA: Diagnosis not present

## 2023-07-05 DIAGNOSIS — M810 Age-related osteoporosis without current pathological fracture: Secondary | ICD-10-CM | POA: Diagnosis not present

## 2023-07-05 DIAGNOSIS — I5033 Acute on chronic diastolic (congestive) heart failure: Secondary | ICD-10-CM | POA: Diagnosis not present

## 2023-07-05 DIAGNOSIS — N184 Chronic kidney disease, stage 4 (severe): Secondary | ICD-10-CM | POA: Diagnosis not present

## 2023-07-05 DIAGNOSIS — E1169 Type 2 diabetes mellitus with other specified complication: Secondary | ICD-10-CM | POA: Diagnosis not present

## 2023-07-05 DIAGNOSIS — I130 Hypertensive heart and chronic kidney disease with heart failure and stage 1 through stage 4 chronic kidney disease, or unspecified chronic kidney disease: Secondary | ICD-10-CM | POA: Diagnosis not present

## 2023-07-05 DIAGNOSIS — J9601 Acute respiratory failure with hypoxia: Secondary | ICD-10-CM | POA: Diagnosis not present

## 2023-07-05 DIAGNOSIS — D509 Iron deficiency anemia, unspecified: Secondary | ICD-10-CM | POA: Diagnosis not present

## 2023-07-05 DIAGNOSIS — I700 Atherosclerosis of aorta: Secondary | ICD-10-CM | POA: Diagnosis not present

## 2023-07-05 DIAGNOSIS — I4819 Other persistent atrial fibrillation: Secondary | ICD-10-CM | POA: Diagnosis not present

## 2023-07-05 DIAGNOSIS — E1122 Type 2 diabetes mellitus with diabetic chronic kidney disease: Secondary | ICD-10-CM | POA: Diagnosis not present

## 2023-07-05 DIAGNOSIS — E1165 Type 2 diabetes mellitus with hyperglycemia: Secondary | ICD-10-CM | POA: Diagnosis not present

## 2023-07-05 DIAGNOSIS — E039 Hypothyroidism, unspecified: Secondary | ICD-10-CM | POA: Diagnosis not present

## 2023-07-05 DIAGNOSIS — N179 Acute kidney failure, unspecified: Secondary | ICD-10-CM | POA: Diagnosis not present

## 2023-07-05 DIAGNOSIS — J690 Pneumonitis due to inhalation of food and vomit: Secondary | ICD-10-CM | POA: Diagnosis not present

## 2023-07-05 DIAGNOSIS — E538 Deficiency of other specified B group vitamins: Secondary | ICD-10-CM | POA: Diagnosis not present

## 2023-07-05 NOTE — Patient Instructions (Signed)
 Visit Information  Thank you for taking time to visit with me today. Please don't hesitate to contact me if I can be of assistance to you before our next scheduled telephone appointment.  Our next appointment is by telephone on Monday July 09, 2023 at 09:45  Please call the care guide team at [phone] if you need to cancel or reschedule your appointment.   Following are the goals we discussed today:  Patient Self Care Activities:  Attend all scheduled provider appointments Call provider office for new concerns or questions  Take medications as prescribed   call office if I gain more than 2 pounds in one day or 5 pounds in one week track weight in diary watch for swelling in feet, ankles and legs every day weigh myself daily follow rescue plan if symptoms flare-up Use assistive devices as needed to prevent falls Continue using home oxygen  as prescribed Continue pacing activity to avoid episodes of shortness of breath Continue working with the home health team that is involved in your care If you believe your condition is getting worse- contact your care providers (doctors) promptly- reaching out to your doctor early when you have concerns can prevent you from having to go to the hospital  If you are experiencing a Mental Health or Behavioral Health Crisis or need someone to talk to, please  call the Suicide and Crisis Lifeline: 988 call the [place]  National Suicide Prevention Lifeline: 719-720-6918 or TTY: 641 374 7934 TTY 785-112-1797) to talk to a trained counselor call 1-800-273-TALK (toll free, 24 hour hotline) go to [facility] [address] [place] [phone]) call the [facility] Crisis Line: [phone] call 911   Patient verbalizes understanding of instructions and care plan provided today and agrees to view in MyChart. Active MyChart status and patient understanding of how to access instructions and care plan via MyChart  confirmed with patient.     Larina Lieurance Mckinney Huntley Knoop, RN, BSN, Media planner  Transitions of Care  [facility] - [facility] [phone]: direct office

## 2023-07-05 NOTE — Transitions of Care (Post Inpatient/ED Visit) (Signed)
 Transition of Care week # 5/ day # 27  Visit Note  07/05/2023  Name: Mary Mccarthy MRN: [id number]          DOB: 1940/08/16  Situation: Patient enrolled in [facility] 30-day program. Visit completed with patient by telephone.   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 Mild (25-49%) prox RCA & LAD,; Moderate (50-69%) Ostial Small RI & prox LCx.     DEPRESSION 10/08/2006   DIABETES MELLITUS, TYPE II 10/05/2006   GERD 06/24/2007   History of CVA (cerebrovascular accident) 08/10/2020   08/2020- found to be in afib with RVR, started on Eliquis  (shower of emboli from A. fib as complication of severe AS)   HYPERLIPIDEMIA 10/08/2006   HYPERTENSION 10/08/2006   INSOMNIA 09/24/2008   Left knee DJD    Nonsustained paroxysmal ventricular tachycardia (HCC) 10/2018   Post-TAVR Zio Patch: Frequent (206) runs of NSVT - Fastest 5 beats 239 bpm. Longest 9.3 Sec avg 135 bpm. (? possibly Afib w/ aberrancy)   Osteoporosis 03/10/2016   PAF (paroxysmal atrial fibrillation) (HCC)    PAF with RVR: CHA2DS2-VASc 9.  On Eliquis  and amiodarone . 08/11/2020   PEPTIC ULCER DISEASE 10/08/2006   S/P TAVR (transcatheter aortic valve replacement) 10/26/2020   s/p TAVR with a  26 mm Medtronic Evolut Pro+ via the TF approach by Dr. Abel Hoe & Dr. Sherene Dilling   Severe aortic stenosis 08/2020   Progression from Mod-Severe AS to Severe AS - noted on Echo 08/2020 -> (mean gradient progressed from 34 to 42 mmHg) this was in setting of new onset A. fib RVR, acute diastolic HF and shower of emboli CVA. ->  Referred for TAVR, completed 10/26/2020   Assessment:  "I am fine, doing quite well. My weights at home have stabilized, it was 180.5 lbs this morning, and I am breathing fine- at my normal; using the oxygen  and not having any issues; home health is coming today- no falls, no problems with my medicine;"    Denies clinical concerns and sounds to be in no distress throughout [facility] 30-day program outreach call today  Patient Reported Symptoms: Cognitive Cognitive Status: Alert and oriented to person, place, and time, Insightful and able to interpret abstract concepts, Normal speech and language skills Cognitive/Intellectual Conditions Management [RPT]: None reported or documented in medical history or problem list      Neurological Neurological Review of Symptoms: No symptoms reported    HEENT HEENT Symptoms Reported: No symptoms reported      Cardiovascular Cardiovascular Symptoms Reported: Swelling in legs or feet (Reports ongoing bilateral LE swelling- states today, "it's the same as it has been; it is more than it was since I got home from the hospital, but the home health nurse has not been concerned; confirmed did not call cardiology provider to report  swelling) Does patient have uncontrolled Hypertension?: No Cardiovascular Conditions: Heart failure, Hypertension, Dysrhythmia Cardiovascular Management Strategies: Coping strategies, Diet modification, Routine screening, Medication therapy, Fluid modification, Weight management, Adequate rest Do You Have a Working Readable Scale?: Yes Weight: 180 lb 8 oz (81.9 kg)  Respiratory Respiratory Symptoms Reported: No symptoms  reported Other Respiratory Symptoms: Reports "breathing fine, no problems; using home oxygen  like I am supposed to and it is helping;" Sounds to be in no distress throughout [facility] call today Additional Respiratory Details: Home O2 at baseline: 2-4 L/min Respiratory Conditions: Shortness of breath  Endocrine Patient reports the following symptoms related to hypoglycemia or hyperglycemia : No symptoms reported Is patient diabetic?: Yes Is patient checking blood sugars at home?: Yes Endocrine Conditions: Diabetes Endocrine Management Strategies: Routine screening, Medication therapy, Medical device, Coping strategies, Diet modification, Adequate rest  Gastrointestinal Gastrointestinal Symptoms Reported: Diarrhea Additional Gastrointestinal Details: Today reports chronic intermittent but ongoing episodic diarrhea- "sometimes it is worse than others" Gastrointestinal Conditions: Diarrhea Gastrointestinal Management Strategies: Medication therapy, Coping strategies Nutrition Risk Screen (CP): No indicators present  Genitourinary Genitourinary Symptoms Reported: No symptoms reported    Integumentary Integumentary Symptoms Reported: No symptoms reported    Musculoskeletal Musculoskelatal Symptoms Reviewed: Unsteady gait, Difficulty walking Additional Musculoskeletal Details: Confirmed continues using walker "all the time" for ambulation Musculoskeletal Conditions: Mobility limited Musculoskeletal Management Strategies: Medical device, Routine screening, Coping strategies, Adequate rest      Psychosocial Psychosocial Symptoms Reported: No symptoms reported         There were no vitals filed for this visit.  Medications Reviewed Today     Reviewed by Kinnley Paulson M, RN (Registered Nurse) on 07/05/23 at [phone]  Med List Status: <None>   Medication Order Taking? Sig Documenting Provider Last Dose Status Informant  acetaminophen  (TYLENOL ) 500 MG tablet [id number] No Take 500 mg by mouth every 6  (six) hours as needed for moderate pain. [provider] Taking Active Self, Child, Pharmacy Records           Med Note Mliss Anderson [place], [name]   Mon May 14, 2023  2:52 PM)    ALPRAZolam  (XANAX ) 0.25 MG tablet [id number] No Take 1 tablet (0.25 mg total) by mouth 3 (three) times daily as needed for anxiety. Roslyn Coombe, MD Taking Active Self, Child, Pharmacy Records  atorvastatin  (LIPITOR ) 40 MG tablet [id number] No TAKE 1 TABLET BY MOUTH EVERY DAY Arleen Lacer, MD Taking Active Self, Child, Pharmacy Records  blood glucose meter kit and supplies [id number] No Dispense based on patient and insurance preference. Use up to four times daily as directed. E11.9 Roslyn Coombe, MD Taking Active Self, Child, Pharmacy Records  Blood Glucose Monitoring Suppl (ONE TOUCH ULTRA 2) w/Device Suzanne Erps [id number] No Use as directed daily Roslyn Coombe, MD Taking Active Self, Child, Pharmacy Records  [employer]S VITAMIN B12 1000 MCG tablet [id number] No TAKE 1 TABLET BY MOUTH EVERY DAY Roslyn Coombe, MD Taking Active Self, Child, Pharmacy Records  hydrALAZINE  (APRESOLINE ) 50 MG tablet [id number] No Take 1 tablet (50 mg total) by mouth 3 (three) times daily. Deforest Fast, MD Taking Active   insulin  glargine (LANTUS ) 100 UNIT/ML Solostar Pen [id number]  Inject 40 Units into the skin daily. Roslyn Coombe, MD  Active   Insulin  Pen Needle 32G X 4 MM MISC [id number] No Use with Lantus  pen Deforest Fast, MD Taking Active   isosorbide  mononitrate (IMDUR ) 30 MG 24 hr tablet [id number] No Take 2 tablets (60 mg total) by  mouth daily. Deforest Fast, MD Taking Active   Lancets [facility] Jewelene Morton PLUS [place]) MISC [id number] No USE AS DIRECTED TEST 1-2 TIMES A DAY Roslyn Coombe, MD Taking Active Self, Child, Pharmacy Records  levothyroxine  (SYNTHROID ) 25 MCG tablet [id number] No TAKE 1 TABLET BY MOUTH EVERY DAY Roslyn Coombe, MD Taking Active Self, Child, Pharmacy Records  metoprolol  succinate (TOPROL -XL) 25 MG 24 hr tablet [id number]  No Take 0.5 tablets (12.5 mg total) by mouth daily. Roslyn Coombe, MD Taking Active   mirtazapine  (REMERON ) 15 MG tablet [id number] No TAKE 1 TABLET BY MOUTH EVERYDAY AT BEDTIME Roslyn Coombe, MD Taking Active Self, Child, Pharmacy Records           Med Note [facility] [place], [name]   Mon May 14, 2023  3:12 PM) Pt adamant of taking medication. Dispense report does not support claim.  ONETOUCH ULTRA test strip [id number] No USE AS INSTRUCTED TEST 1-2 TIMES A DAY Roslyn Coombe, MD Taking Active Self, Child, Pharmacy Records  pantoprazole  (PROTONIX ) 40 MG tablet [id number] No TAKE 1 TABLET BY MOUTH TWICE A DAY Ardia Kraft, PA-C Taking Active Self, Child, Pharmacy Records  predniSONE  (DELTASONE ) 20 MG tablet [id number] No Take 2 tablets (40 mg total) by mouth daily with breakfast.  Patient taking differently: Take 30 mg by mouth daily with breakfast. Tapering down to discontinue this medication. Currently on the 1.5 tablets.   Deforest Fast, MD Taking Active   solifenacin  (VESICARE ) 5 MG tablet [id number] No Take 1 tablet (5 mg total) by mouth daily. Roslyn Coombe, MD Taking Active Self, Child, Pharmacy Records  torsemide  (DEMADEX ) 20 MG tablet [id number] No Take 2 tablets (40 mg total) by mouth daily. Deforest Fast, MD Taking Active            Recommendation:   Specialty provider follow-up cardiology provider as scheduled 07/10/23  Follow Up Plan:   Telephone follow-up in 1 week- as scheduled 07/09/23 for TOC case closure if no hospital readmission  Plan for next week's call: Review medication adherence Review upcoming provider office visits: 07/10/23- cardiology Review home health PT/ RN visits Review daily weights/ status of lower extremity swelling since last outreach Reinforce education re: self health management of CHF/ daily weights/ safe use of home O2- 3 L/min Review blood sugars at home; use of insulin / insulin  dosing/ ensure no hypoglycemia; update on status of prednisone   therapy  Pls call/ message for questions,  Jacub Waiters Mckinney Avett Reineck, RN, BSN, CCRN Alumnus RN Care Manager  Transitions of Care  [facility]BCI - [facility] [phone]: direct office

## 2023-07-06 ENCOUNTER — Other Ambulatory Visit

## 2023-07-06 DIAGNOSIS — R7989 Other specified abnormal findings of blood chemistry: Secondary | ICD-10-CM

## 2023-07-06 LAB — BASIC METABOLIC PANEL WITH GFR
BUN: 38 mg/dL — ABNORMAL HIGH (ref 6–23)
CO2: 36 meq/L — ABNORMAL HIGH (ref 19–32)
Calcium: 8.9 mg/dL (ref 8.4–10.5)
Chloride: 100 meq/L (ref 96–112)
Creatinine, Ser: 1.64 mg/dL — ABNORMAL HIGH (ref 0.40–1.20)
GFR: 28.52 mL/min — ABNORMAL LOW (ref >60.00)
Glucose, Bld: 83 mg/dL (ref 70–99)
Potassium: 4 meq/L (ref 3.5–5.1)
Sodium: 143 meq/L (ref 135–145)

## 2023-07-07 ENCOUNTER — Other Ambulatory Visit (HOSPITAL_COMMUNITY): Payer: Self-pay

## 2023-07-08 LAB — HYPERSENSITIVITY PNEUMONITIS
A. Pullulans Abs: NEGATIVE
A.Fumigatus #1 Abs: NEGATIVE
Micropolyspora faeni, IgG: NEGATIVE
Pigeon Serum Abs: NEGATIVE
Thermoact. Saccharii: NEGATIVE
Thermoactinomyces vulgaris, IgG: NEGATIVE

## 2023-07-08 LAB — MYOMARKER 3 PLUS PROFILE (RDL)
Anti-EJ Ab (RDL): NEGATIVE
Anti-Jo-1 Ab (RDL): <20 U (ref <20)
Anti-Ku Ab (RDL): NEGATIVE
Anti-MDA-5 Ab (CADM-140)(RDL): <20 U (ref <20)
Anti-Mi-2 Ab (RDL): NEGATIVE
Anti-NXP-2 (P140) Ab (RDL): <20 U (ref <20)
Anti-OJ Ab (RDL): NEGATIVE
Anti-PL-12 Ab (RDL: NEGATIVE
Anti-PL-7 Ab (RDL): NEGATIVE
Anti-PM/Scl-100 Ab (RDL): <20 U (ref <20)
Anti-SAE1 Ab, IgG (RDL): <20 U (ref <20)
Anti-SRP Ab (RDL): NEGATIVE
Anti-SS-A 52kD Ab, IgG (RDL): <20 U (ref <20)
Anti-TIF-1gamma Ab (RDL): <20 U (ref <20)
Anti-U1 RNP Ab (RDL): <20 U (ref <20)
Anti-U2 RNP Ab (RDL): NEGATIVE
Anti-U3 RNP (Fibrillarin)(RDL): NEGATIVE

## 2023-07-09 ENCOUNTER — Other Ambulatory Visit: Payer: Self-pay | Admitting: *Deleted

## 2023-07-09 ENCOUNTER — Other Ambulatory Visit (HOSPITAL_COMMUNITY): Payer: Self-pay

## 2023-07-09 VITALS — Wt 185.2 lb

## 2023-07-09 DIAGNOSIS — K219 Gastro-esophageal reflux disease without esophagitis: Secondary | ICD-10-CM | POA: Diagnosis not present

## 2023-07-09 DIAGNOSIS — I5032 Chronic diastolic (congestive) heart failure: Secondary | ICD-10-CM

## 2023-07-09 DIAGNOSIS — E7849 Other hyperlipidemia: Secondary | ICD-10-CM | POA: Diagnosis not present

## 2023-07-09 DIAGNOSIS — I160 Hypertensive urgency: Secondary | ICD-10-CM | POA: Diagnosis not present

## 2023-07-09 DIAGNOSIS — M810 Age-related osteoporosis without current pathological fracture: Secondary | ICD-10-CM | POA: Diagnosis not present

## 2023-07-09 DIAGNOSIS — I700 Atherosclerosis of aorta: Secondary | ICD-10-CM | POA: Diagnosis not present

## 2023-07-09 DIAGNOSIS — J690 Pneumonitis due to inhalation of food and vomit: Secondary | ICD-10-CM | POA: Diagnosis not present

## 2023-07-09 DIAGNOSIS — I5033 Acute on chronic diastolic (congestive) heart failure: Secondary | ICD-10-CM | POA: Diagnosis not present

## 2023-07-09 DIAGNOSIS — J9601 Acute respiratory failure with hypoxia: Secondary | ICD-10-CM | POA: Diagnosis not present

## 2023-07-09 DIAGNOSIS — N179 Acute kidney failure, unspecified: Secondary | ICD-10-CM | POA: Diagnosis not present

## 2023-07-09 DIAGNOSIS — E538 Deficiency of other specified B group vitamins: Secondary | ICD-10-CM | POA: Diagnosis not present

## 2023-07-09 DIAGNOSIS — E1122 Type 2 diabetes mellitus with diabetic chronic kidney disease: Secondary | ICD-10-CM | POA: Diagnosis not present

## 2023-07-09 DIAGNOSIS — I4819 Other persistent atrial fibrillation: Secondary | ICD-10-CM | POA: Diagnosis not present

## 2023-07-09 DIAGNOSIS — E1165 Type 2 diabetes mellitus with hyperglycemia: Secondary | ICD-10-CM | POA: Diagnosis not present

## 2023-07-09 DIAGNOSIS — E1169 Type 2 diabetes mellitus with other specified complication: Secondary | ICD-10-CM | POA: Diagnosis not present

## 2023-07-09 DIAGNOSIS — E039 Hypothyroidism, unspecified: Secondary | ICD-10-CM | POA: Diagnosis not present

## 2023-07-09 DIAGNOSIS — I272 Pulmonary hypertension, unspecified: Secondary | ICD-10-CM | POA: Diagnosis not present

## 2023-07-09 DIAGNOSIS — D509 Iron deficiency anemia, unspecified: Secondary | ICD-10-CM | POA: Diagnosis not present

## 2023-07-09 DIAGNOSIS — I2510 Atherosclerotic heart disease of native coronary artery without angina pectoris: Secondary | ICD-10-CM | POA: Diagnosis not present

## 2023-07-09 DIAGNOSIS — I130 Hypertensive heart and chronic kidney disease with heart failure and stage 1 through stage 4 chronic kidney disease, or unspecified chronic kidney disease: Secondary | ICD-10-CM | POA: Diagnosis not present

## 2023-07-09 DIAGNOSIS — N184 Chronic kidney disease, stage 4 (severe): Secondary | ICD-10-CM | POA: Diagnosis not present

## 2023-07-09 NOTE — Transitions of Care (Post Inpatient/ED Visit) (Signed)
 Transition of Care week # 6/ day # 31- TOC program case closure  Visit Note  07/09/2023  Name: Mary Mccarthy MRN: [id number]          DOB: [birth year]  Situation: Patient enrolled in [facility] 30-day program. Visit completed with patient by telephone.   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 2 unplanned hospitalizations x last 12 months  TOC 30--day outreach completed; patient has successfully met/ accomplished her established goals for [facility] 30-day program without hospital readmission and referral was sent for ongoing follow up with longitudinal RN CM   Initial Transition Care Management Follow-up Telephone Call    Past Medical History:  Diagnosis Date   ALLERGIC RHINITIS 06/24/2007   Anemia    AVM (arteriovenous malformation) of colon    Blood transfusion without reported diagnosis 05/2015   COLONIC POLYPS, HX OF 06/24/2007   Coronary artery disease, non-occlusive 05/2015   Trop + w/ Acute Anemia =>CATH: small RI - Ostial 60%, ostial RCA 30% and dLAD 40-50%;; 9/'21: Cor Ca Score 624. Mild (25-49%) prox RCA & LAD,; Moderate (50-69%) Ostial Small RI & prox LCx.     DEPRESSION 10/08/2006   DIABETES MELLITUS, TYPE II 10/05/2006   GERD 06/24/2007   History of CVA (cerebrovascular accident) 08/10/2020   08/2020- found to be in afib with RVR, started on Eliquis  (shower of emboli from A. fib as complication of severe AS)   HYPERLIPIDEMIA 10/08/2006   HYPERTENSION 10/08/2006   INSOMNIA 09/24/2008   Left knee DJD    Nonsustained paroxysmal ventricular tachycardia (HCC) 10/2018   Post-TAVR Zio Patch: Frequent (206) runs of NSVT - Fastest 5 beats 239 bpm. Longest 9.3 Sec avg 135 bpm. (? possibly Afib w/ aberrancy)   Osteoporosis  03/10/2016   PAF (paroxysmal atrial fibrillation) (HCC)    PAF with RVR: CHA2DS2-VASc 9.  On Eliquis  and amiodarone . 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 Abel Hoe & Dr. Sherene Dilling   Severe aortic stenosis 08/2020   Progression from Mod-Severe AS to Severe AS - noted on Echo 08/2020 -> (mean gradient progressed from 34 to 42 mmHg) this was in setting of new onset A. fib RVR, acute diastolic HF and shower of emboli CVA. ->  Referred for TAVR, completed 10/26/2020   Assessment:  "I am fine, no problems today.  Over the weekend, I ran out of my fluid pill and the pharmacy was closed so I missed one day of my diuretic and gained weight.  My son picked them up today and I have started taking them again this morning.  My breathing is fine, using my oxygen , oxygen  levels are in the mid- 90's.  I do have a slight cough; I am going to see Dr. Addie Holstein tomorrow and tell him about the short episode I had this weekend of irregular heart beats- it was over quickly, but my heart rate was very irregular.  My swelling is much better than it was, it has left my thighs and is now just underneath me knees like it usually is"    Denies clinical concerns and sounds to be in no distress throughout [facility] 30-day program outreach call today  Patient Reported Symptoms: Cognitive Cognitive Status: Insightful and able to interpret abstract concepts, Alert and oriented to person, place, and time, Normal speech and language skills Cognitive/Intellectual Conditions Management [RPT]: None reported or documented in medical history or problem list   Health Maintenance Behaviors: Healthy diet, Stress management Health Facilitated by: Healthy diet, Rest  Neurological Neurological Review of Symptoms: No symptoms reported    HEENT HEENT Symptoms Reported: No symptoms reported      Cardiovascular  Cardiovascular Symptoms Reported: Swelling in legs or feet, Irregular pulse, Fatigue (Reports had "short" self-limiting episode of "irregular" heart rate this weekend; reports "lasted only about 20 minutes; then stopped" advised to share this episode in detail with cardiology provider tomorrow during scheduled office visit) Does patient have uncontrolled Hypertension?: No Cardiovascular Conditions: Hypertension, Heart failure, Dysrhythmia Cardiovascular Management Strategies: Routine screening, Medication therapy, Diet modification, Coping strategies Do You Have a Working Readable Scale?: Yes Weight: 185 lb 3.2 oz (84 kg) (home reported value from 07/09/23)  Respiratory Respiratory Symptoms Reported: Productive cough Other Respiratory Symptoms: Reports "breathing fine, but I am a little congested feeling; I ran out of the fluid pills over the weekend, so I have gained weight- but I have them now and am taking them.  Using the home O2 like they told me to and going to my heart doctor tomorrow" Additional Respiratory Details: Home O2 at baseline: 2-3 L/min Respiratory Conditions: Shortness of breath, Cough  Endocrine Patient reports the following symptoms related to hypoglycemia or hyperglycemia : No symptoms reported Is patient diabetic?: Yes Is patient checking blood sugars at home?: Yes Endocrine Conditions: Diabetes Endocrine Management Strategies: Medication therapy, Routine screening, Diet modification, Coping strategies  Gastrointestinal Gastrointestinal Symptoms Reported: No symptoms reported      Genitourinary Genitourinary Symptoms Reported: No symptoms reported Genitourinary Conditions: Chronic kidney disease Genitourinary Management Strategies: Medication therapy, Fluid modification  Integumentary Integumentary Symptoms Reported: No symptoms reported    Musculoskeletal Musculoskelatal Symptoms Reviewed: Unsteady gait, Difficulty walking Additional Musculoskeletal Details: Continues  using walker "all the time" as per baseline Musculoskeletal Conditions: Mobility limited Musculoskeletal Management Strategies: Medical device, Routine screening, Coping strategies      Psychosocial Psychosocial Symptoms Reported: No symptoms reported         Vitals:   07/09/23 0945  SpO2: 94%    Medications Reviewed Today     Reviewed by Amauria Younts M, RN (Registered Nurse) on 07/09/23 at 0945  Med List Status: <None>   Medication Order Taking? Sig Documenting Provider Last Dose Status Informant  acetaminophen  (TYLENOL ) 500 MG tablet [id number] No Take 500 mg by mouth every 6 (six) hours as needed for moderate pain. [provider] Taking Active Self, Child, Pharmacy Records           Med Note Mliss Anderson [place], [name]   Mon May 14, 2023  2:52 PM)    ALPRAZolam  (XANAX ) 0.25 MG tablet [id number] No Take 1 tablet (0.25 mg total) by mouth 3 (three) times daily as needed for anxiety. Roslyn Coombe, MD Taking Active Self, Child, Pharmacy Records  atorvastatin  (LIPITOR ) 40 MG tablet [id number] No TAKE 1 TABLET BY MOUTH EVERY DAY Arleen Lacer, MD Taking Active Self, Child, Pharmacy Records  blood glucose meter kit and supplies [id number] No Dispense based on patient and insurance preference. Use up to four times daily as directed. E11.9 Roslyn Coombe, MD Taking Active Self, Child, Pharmacy Records  Blood Glucose Monitoring Suppl (ONE TOUCH ULTRA 2) w/Device KIT [id number] No Use as directed daily  Roslyn Coombe, MD Taking Active Self, Child, Pharmacy Records  CVS VITAMIN B12 1000 MCG tablet [id number] No TAKE 1 TABLET BY MOUTH EVERY DAY Roslyn Coombe, MD Taking Active Self, Child, Pharmacy Records  hydrALAZINE  (APRESOLINE ) 50 MG tablet [id number] No Take 1 tablet (50 mg total) by mouth 3 (three) times daily. Deforest Fast, MD Taking Active   insulin  glargine (LANTUS ) 100 UNIT/ML Solostar Pen [id number]  Inject 40 Units into the skin daily. Roslyn Coombe, MD  Active   Insulin  Pen  Needle 32G X 4 MM MISC [id number] No Use with Lantus  pen Deforest Fast, MD Taking Active   isosorbide  mononitrate (IMDUR ) 30 MG 24 hr tablet [id number] No Take 2 tablets (60 mg total) by mouth daily. Deforest Fast, MD Taking Active   Lancets [facility] Jewelene Morton PLUS [place]) MISC [id number] No USE AS DIRECTED TEST 1-2 TIMES A DAY Roslyn Coombe, MD Taking Active Self, Child, Pharmacy Records  levothyroxine  (SYNTHROID ) 25 MCG tablet [id number] No TAKE 1 TABLET BY MOUTH EVERY DAY Roslyn Coombe, MD Taking Active Self, Child, Pharmacy Records  metoprolol  succinate (TOPROL -XL) 25 MG 24 hr tablet [id number] No Take 0.5 tablets (12.5 mg total) by mouth daily. Roslyn Coombe, MD Taking Active   mirtazapine  (REMERON ) 15 MG tablet [id number] No TAKE 1 TABLET BY MOUTH EVERYDAY AT BEDTIME Roslyn Coombe, MD Taking Active Self, Child, Pharmacy Records           Med Note [facility] [place], [name]   Mon May 14, 2023  3:12 PM) Pt adamant of taking medication. Dispense report does not support claim.  ONETOUCH ULTRA test strip [id number] No USE AS INSTRUCTED TEST 1-2 TIMES A DAY Roslyn Coombe, MD Taking Active Self, Child, Pharmacy Records  pantoprazole  (PROTONIX ) 40 MG tablet [id number] No TAKE 1 TABLET BY MOUTH TWICE A DAY Ardia Kraft, PA-C Taking Active Self, Child, Pharmacy Records  predniSONE  (DELTASONE ) 20 MG tablet [id number] No Take 2 tablets (40 mg total) by mouth daily with breakfast.  Patient taking differently: Take 30 mg by mouth daily with breakfast. Tapering down to discontinue this medication. Currently on the 1.5 tablets.   Deforest Fast, MD Taking Active   solifenacin  (VESICARE ) 5 MG tablet [id number] No Take 1 tablet (5 mg total) by mouth daily. Roslyn Coombe, MD Taking Active Self, Child, Pharmacy Records  torsemide  (DEMADEX ) 20 MG tablet [id number] No Take 2 tablets (40 mg total) by mouth daily. Deforest Fast, MD Taking Active            Recommendation:   Specialty provider follow-up  cardiology provider 07/10/23- as scheduled  Follow Up Plan:   Closing From:  Transitions of Care Program  [facility] 30--day outreach completed; patient has successfully met/ accomplished her established goals for [facility] 30-day program without hospital readmission and referral was sent for ongoing follow up with longitudinal RN CM   Pls call/ message for questions,  Erlene Hawks, RN, BSN, CCRN Alumnus RN Care Manager  Transitions of Care  [facility]BCI - [facility] [phone]: direct office

## 2023-07-09 NOTE — Patient Instructions (Signed)
 Visit Information  Thank you for taking time to visit with me today. Please don't hesitate to contact me if I can be of assistance to you before our next scheduled telephone appointment.  It has been a pleasure working with you over the last 30 days!  Very good job managing your care after your hospital visit!    Please listen for a call from the new nurse care manager who will pick up in your care where we are leaving off today  Following are the goals we discussed today:  Patient Self Care Activities:  Attend all scheduled provider appointments Call provider office for new concerns or questions  Take medications as prescribed   call office if I gain more than 2 pounds in one day or 5 pounds in one week track weight in diary watch for swelling in feet, ankles and legs every day weigh myself daily follow rescue plan if symptoms flare-up Use assistive devices as needed to prevent falls Continue using home oxygen  as prescribed Continue pacing activity to avoid episodes of shortness of breath Continue working with the home health team that is involved in your care If you believe your condition is getting worse- contact your care providers (doctors) promptly- reaching out to your doctor early when you have concerns can prevent you from having to go to the hospital  If you are experiencing a Mental Health or Behavioral Health Crisis or need someone to talk to, please  call the Suicide and Crisis Lifeline: 988 call the [place]  National Suicide Prevention Lifeline: 279-253-2212 or TTY: 5626681971 TTY (440)679-7176) to talk to a trained counselor call 1-800-273-TALK (toll free, 24 hour hotline) go to [facility] [address] [place] [phone]) call the [facility] Crisis Line: [phone] call 911   Patient verbalizes understanding of instructions and care plan provided today and agrees to view in MyChart. Active MyChart status and patient  understanding of how to access instructions and care plan via MyChart confirmed with patient.     Pls call/ message for questions,  Lakeasha Petion Mckinney Ariyel Jeangilles, RN, BSN, CCRN Alumnus RN Care Manager  Transitions of Care  [facility]BCI - [facility] [phone]: direct office

## 2023-07-09 NOTE — Progress Notes (Addendum)
 Cardiology Office Note:  .   Date:  07/14/2023  ID:  Mary Mccarthy 01/06/1941, MRN [id number] PCP: Mary Coombe, MD  [facility] Providers Cardiologist:  Mary Bustard, MD Electrophysiologist:  Mary Rumple, MD     Chief Complaint  Patient presents with   Follow-up    2-week follow-up after APP visit.   Atrial Fibrillation   Congestive Heart Failure   Cardiac Valve Problem    History of TAVR with perivalvular leak noted.    Patient Profile: .     Mary Mccarthy is a chronically ill 83 y.o. female  with a PMH notable for Chronic Hypoxic Respiratory Failure (on oxygen ), Chronic HFpEF (diastolic CHF-related to longstanding AS), s/p TAVR for Severe AS, Persistent A-Fib, HTN, HLD, DM-2, chronic anemia (h/o GI Bleed 2/2 PUD/AVM) & hypothyroidism who presents here for 2-week follow-up at the request of Mary Prince, PA.  She continues to return here today at the request Mary Coombe, MD.  PMH:  Chronic Respiratory Failure on Oxygen  at Baseline Chronic Diastolic Heart Failure Now complicated by concern for ILD potentially related to amiodarone   H/o Severe AS s/p TAVR (26 mm Medtronic Evolut Pro+ THV via TF approach in 10/2020) mild CAD by pre-TAVR cath 09/2020;  Persistent A. Fib not on chronic AC due to recurrent GI bleed; history of CVA,);  No longer on amiodarone -for fear of amiodarone  related ILD H/o CVA HTN , HLD, , type 2 diabetes, hypothyroidism Chronic Anemia:  History of PUD/AVM =>  previously requiring transfusions ; no longer on DOAC GERD     She was hospitalized from April 7th to May 1st, 2025, for acute hypoxic respiratory failure. During her admission, she was treated for CHF with IV diuretics and received antibiotics for pneumonia. Imaging was performed to evaluate for interstitial lung disease, and she was taken off amiodarone  and started on steroids. At discharge, she was switched from Lasix  to torsemide .  Mary Mccarthy was last seen on 06/21/2023 by Mary Prince, PA-C => noting improvement in her breathing since discharge.  Still not able to be overly active, but  "get up and move around' and perform daily activities like washing dishes and bathing".  She remains being cautious about using the shower due to its narrowness. Her oxygen  saturation remained around 95-96% even after forgetting to use her oxygen  concentrator for two hours. Her oxygen  flow was set at 3 liters per minute, as it was in the hospital. She remained on a prednisone  taper and recently saw Dr. Irving Mccarthy, who ordered inflammatory labs to explore non-amiodarone  causes of interstitial lung disease.  CHF: Her diuretic been switched from furosemide  to torsemide  40 mg daily.  However she was noting pedal edema worse than usual.  Home weights were mostly in the 175 pound range since discharge.  Sleeps only recliner at baseline.She drinks about two liters of fluid daily, including water, coffee, and ginger ale, and tries to avoid salty foods. She experiences frequent thirst but tries to limit fluid intake. Amlodipine  discontinued due to concerns for edema.  Suggested possibility SGL 2 midodrine.  Deferred because of UTIs. Discussed additional dose of torsemide  for worsening edema Recommended monitoring blood pressures at home. A-fib: Denied irregular heartbeats or palpitations since being off amiodarone .  Not on DOAC or aspirin  because of history of GI bleed.  Toprol -XL 25 mg for rate control. CKD reviewed  Subjective  Discussed the use of AI scribe software for clinical note transcription with the patient, who gave  verbal consent to proceed.  History of Present Illness Mrs. Mary Mccarthy returns here for follow-up stating that she is doing relatively well.  Her oxygen  levels have been staying in the 90s.  For the most part she has not noted any irregular heartbeats but last Friday she did experience some heart palpitations and chest discomfort lasting for few  minutes where she noted her heart rate fluctuating between 70 to 140 bpm.  That lasted less than half an hour and she has not had any further symptoms.  No ongoing chest pain or worsening dyspnea with daily activities.  She reports significant lower extremity edema, which prevents her from wearing regular shoes. The edema decreases slightly with leg elevation but recurs in the morning.  Hard to tell about PND or orthopnea because she sleeps in a recliner with nighttime oxygen .  She was concerned about being called from the pulmonary office being told that she had CKD stage IV with a creatinine being up to 2.2.  However on follow-up it was down to 1.6. She manages her fluid intake and diuretic use to address kidney function and swelling.  Her weights have actually drifted up into the 180 to 184 pound range which is up from what it had been.  She also notes that her blood pressure still been up a little bit since stopping amlodipine .  Today's recording is a little high for her this morning it was in the 130s systolic.  Cardiovascular ROS: positive for - since last visit only 1 episode of irregular heartbeat with fast rates associated with some chest discomfort and dyspnea.  Still has some orthopnea sleeping in a recliner with weight increase and worsening edema.  However, her dyspnea seems to be stable and oxygen  levels are stable. negative for - chest pain, orthopnea, paroxysmal nocturnal dyspnea, or syncope or near syncope, TIA or amaurosis fugax.  Claudication.  ROS:  Review of Systems - denies any melena, hematochezia hematuria or epistaxis.  Mobility is somewhat limited but mostly by musculoskeletal issues.    Objective    Studies Reviewed: Aaron Aas   EKG Interpretation Date/Time:  Tuesday July 10 2023 13:52:04 EDT Ventricular Rate:  61 PR Interval:  124 QRS Duration:  104 QT Interval:  444 QTC Calculation: 446 R Axis:   -2  Text Interpretation: Normal sinus rhythm Minimal voltage criteria for  LVH, may be normal variant ( Cornell product ) Nonspecific ST abnormality When compared with ECG of 27-May-2023 02:15, QRS duration has decreased Nonspecific ST abnormality Lateral leads Confirmed by Mary Mccarthy ([id number]) on 07/10/2023 2:13:21 PM    05/15/23 TTE: EF 60 to 65%.  No RWMA.  Severe asymmetric LVH with septal segment.  Unable to assess DF.  Mildly enlarged RV with normal function.  Moderately elevated PAP estimated 54 mmHg.  Mild biatrial dilation.  Degenerative mitral valve with mild MR and mild MS.  Severe MAC.  Mild to moderate TR.  26 mm Medtronic (stented) TAVR valve present in aortic position with normal structure and function.  Mean AVG 9 mmHg.  Perivalvular leak more prominent  Lab Results  Component Value Date   NA 143 07/06/2023   K 4.0 07/06/2023   CREATININE 1.64 (H) 07/06/2023   GFR 28.52 (L) 07/06/2023   GLUCOSE 83 07/06/2023   Risk Assessment/Calculations:    CHA2DS2-VASc Score = 9   This indicates a 12.2% annual risk of stroke. The patient's score is based upon: CHF History: 1 HTN History: 1 Diabetes History: 1 Stroke History: 2 Vascular Disease  History: 1 Age Score: 2 Gender Score: 1    HYPERTENSION CONTROL Vitals:   07/10/23 1345   BP: (!) 170/64 (!) 158/64    The patient's blood pressure is elevated above target today.  In order to address the patient's elevated BP: Increase diuretic dosing and reassess.              Physical Exam:   VS:  BP (!) 158/64   Pulse 61   Ht 5\' 1"  (1.549 m)   Wt 184 lb (83.5 kg)   SpO2 99%   BMI 34.77 kg/m    Wt Readings from Last 3 Encounters:  07/10/23 184 lb (83.5 kg)  07/09/23 185 lb 3.2 oz (84 kg)  07/05/23 180 lb 8 oz (81.9 kg)    GEN: Chronically ill-appearing.  Mildly pale.  Used wheelchair to get into the office.  On chronic oxygen .  No acute distress/nontoxic.  Mild to moderately obese. NECK: No JVD; No carotid bruits CARDIAC: RRR, normal S1 and S2 with 2/6 SEM at RUSB but no obvious diastolic  murmur.  No rubs or gallops. RESPIRATORY:  Clear to auscultation without rales, wheezing or rhonchi ; nonlabored, good air movement. ABDOMEN: Soft, non-tender, non-distended EXTREMITIES: 1-2+ bilateral LE edema; No deformity     ASSESSMENT AND PLAN: .    Problem List Items Addressed This Visit       Cardiology Problems   CHF (congestive heart failure), NYHA class II, chronic, diastolic (HCC) - Primary (Chronic)   NYHA class II-III symptoms with recent exacerbation. Dyspnea seems to be stable but her weights are up and edema is worse. Weight increased to 184 lbs, target 178 lbs. - Titrate hydralazine  to 100 mg 3 times daily and Imdur  to 60 mg daily - Use support socks to manage swelling. - Adjust torsemide  dosage: double for two days if weight >182 lbs, then regular dose. - Monitor weight regularly, aim for target weight of 178 lbs.  For the next 7 days: Day 1 - take 40 mg ( 2 tablets of Torsemide )  in the morning  and 40 mg  ( 2 tablets of Torsemide ) early afternoon Day 2  take 40 mg  ( 2 tablets of torsemide ) in the morning only  Day 3  take  40 mg ( 2 tablets of Torsemide )  in the morning  and 40 mg ( 2 tablets of Torsemide )  early afternoon Day 4 take 40 mg  ( 2 tablets of torsemide ) in the morning only  Day 5 take  40 mg ( 2 tablets of Torsemide )  in the morning  and 20 mg ( 1 tablet of Torsemide )  early afternoon Day 6 take 40 mg  ( 2 tablets of torsemide ) in the morning only   Day 7  take  40 mg ( 2 tablets of Torsemide )  in the morning  and 20 mg ( 1 tablet of Torsemide )  early afternoon  Then return To 40 mg  daily   2 tablets of Torsemide )      Relevant Medications   isosorbide  mononitrate (IMDUR ) 60 MG 24 hr tablet   hydrALAZINE  (APRESOLINE ) 100 MG tablet   Coronary artery disease, non-occlusive (Chronic)   Nonocclusive CAD noted on cath.  Continue risk factor modification with statin, beta-blocker, Imdur  and diabetic control.      Relevant Medications   isosorbide   mononitrate (IMDUR ) 60 MG 24 hr tablet   hydrALAZINE  (APRESOLINE ) 100 MG tablet   Other Relevant Orders   EKG 12-Lead (Completed)  Essential hypertension (Chronic)   Recent readings in 150s-160s. Blood pressure control crucial to prevent CKD and cardiac issues. Imdur  and hydralazine  used. Because of renal insufficiency, no longer on ARB-converted to hydralazine /nitrate - Increase hydralazine  dosage to double current dose = 100 mg 3 times daily; along with increasing Imdur  to 60 mg daily - Consider reintroducing amlodipine  and potentially converting back to ARB if blood pressure remains uncontrolled. - Is on same dose torsemide  with dosing noted in the CHF segment.      Relevant Medications   isosorbide  mononitrate (IMDUR ) 60 MG 24 hr tablet   hydrALAZINE  (APRESOLINE ) 100 MG tablet   Hyperlipidemia associated with type 2 diabetes mellitus (HCC) (Chronic)   Remains on atorvastatin  40 mg daily.  I do not see recent labs.  Should be due for follow-up labs. Diabetes managed by PCP.  Currently on Lantus  insulin . SGLT2 inhibitor not being used mostly because of recurrent UTIs.       Relevant Medications   isosorbide  mononitrate (IMDUR ) 60 MG 24 hr tablet   hydrALAZINE  (APRESOLINE ) 100 MG tablet   Longstanding persistent atrial fibrillation (HCC) (Chronic)   Previously managed with amiodarone  for rhythm control.  Now stopped because of concerns for pulmonary toxicity.  Remains on Toprol -XL 12.5 mg daily for rate control-with resting heart rates in the 60s.  No room to titrate further.  Limited options for rhythm control now that she is no longer amiodarone . Thankfully, she only had 1 brief episode that suggest a possible recurrence of A-fib and spontaneously converted back.  Currently in sinus rhythm.  No longer on DOAC or even aspirin  because of concerns for recurrent bleeding requiring transfusion.      Relevant Medications   isosorbide  mononitrate (IMDUR ) 60 MG 24 hr tablet    hydrALAZINE  (APRESOLINE ) 100 MG tablet   Pulmonary hypertension (HCC) (Chronic)   Noted on recent Echo.  Probably related to diastolic dysfunction but also underlying pulmonary disease.  Continue to titrate medications for CHF along with standing diuretic dose with sliding scale.  For now we will hold off on invasive evaluation unless symptoms worsen.      Relevant Medications   isosorbide  mononitrate (IMDUR ) 60 MG 24 hr tablet   hydrALAZINE  (APRESOLINE ) 100 MG tablet     Other   Chronic hypoxic respiratory failure (HCC) (Chronic)   Chronic pulmonary disease with stable oxygen  requirements.  Completed steroid dose for possible pulmonary toxicity from amiodarone . Amiodarone  discontinued for AFib control due to concerns for pulmonary toxicity. - Per pulmonary medicine, periodically go without oxygen  while sitting, monitor pulse oximetry. - Ensure oxygen  saturation remains above 90%.      CKD (chronic kidney disease) stage 3, GFR 30-59 ml/min (HCC) (Chronic)   Creatinine levels 1.64-2.2, recent improvement to 1.64.  CKD Stage 4 not imminent if creatinine <2.0.  Importance of hydration emphasized. - Monitor creatinine levels regularly. - Maintain hydration without overhydration. - Adjust diuretic dosage based on weight and fluid status.      GI bleed (Chronic)   Due to recurrent GI bleeds, no longer on DOAC or aspirin .      Goals of care, counseling/discussion   Discussed palliative care and hospice. Emphasized quality of life, preference to remain at home, and DNR in place. - Consider palliative care services to manage symptoms and prevent hospitalizations. - Maintain current living situation with family and healthcare support.      Hypercoagulable state due to atrial fibrillation (HCC) (Chronic)   Not on DOAC or aspirin  because of recurrent  GI bleeds.      S/P TAVR (transcatheter aortic valve replacement) (Chronic)   Slight leakage with high right ventricular and atrial  pressures due to volume overload.  Stable mean gradient of 9 post-TAVR. - Monitor valve function and volume status => anticipate rechecking echocardiogram sooner than 1 year to reassess PVL (will forward echo to valve team) - Manage blood pressure to reduce valve strain.           Follow-Up: Return in 3 weeks (on 07/31/2023) for Follow-up with APP, 3 month follow-up, Routine follow up with me. Regular follow-up with cardiology and nephrology for CKD, hypertension, and cardiac status. - Follow up with Cardiology APP in June, and MD in September -> will need chemistry panel and potentially lipids ordered at APP visit. => Eventually would like to get her back to target weight of 175 to 178 pounds at home.  Patient is continue to monitor home weights and blood pressures. - Schedule regular nephrology appointments.  Total time spent: 32 min spent with patient + 36 min spent charting = 66 min  I spent 68 minutes in the care of Leiann Temple Feeler today including reviewing labs (2 minutes), reviewing studies (5 minutes reviewing echocardiogram and comparing to previous echoes), face to face time discussing treatment options (32 minutes), reviewing records from recent hospitalization, pulmonary critical care note and APP note (14 minutes), 15 minutes dictating, and documenting in the encounter.     Signed, Arleen Lacer, MD, MS Mary Mccarthy, M.D., M.S. Interventional Chartered certified accountant  Pager # [phone]

## 2023-07-10 ENCOUNTER — Telehealth: Payer: Self-pay | Admitting: *Deleted

## 2023-07-10 ENCOUNTER — Ambulatory Visit: Payer: 59 | Attending: Cardiology | Admitting: Cardiology

## 2023-07-10 ENCOUNTER — Encounter: Payer: Self-pay | Admitting: Cardiology

## 2023-07-10 VITALS — BP 158/64 | HR 61 | Ht 61.0 in | Wt 184.0 lb

## 2023-07-10 DIAGNOSIS — I10 Essential (primary) hypertension: Secondary | ICD-10-CM

## 2023-07-10 DIAGNOSIS — Z7189 Other specified counseling: Secondary | ICD-10-CM | POA: Diagnosis not present

## 2023-07-10 DIAGNOSIS — I272 Pulmonary hypertension, unspecified: Secondary | ICD-10-CM | POA: Diagnosis not present

## 2023-07-10 DIAGNOSIS — I5032 Chronic diastolic (congestive) heart failure: Secondary | ICD-10-CM | POA: Diagnosis not present

## 2023-07-10 DIAGNOSIS — I2510 Atherosclerotic heart disease of native coronary artery without angina pectoris: Secondary | ICD-10-CM | POA: Diagnosis not present

## 2023-07-10 DIAGNOSIS — N1832 Chronic kidney disease, stage 3b: Secondary | ICD-10-CM

## 2023-07-10 DIAGNOSIS — K2971 Gastritis, unspecified, with bleeding: Secondary | ICD-10-CM

## 2023-07-10 DIAGNOSIS — D6869 Other thrombophilia: Secondary | ICD-10-CM | POA: Diagnosis not present

## 2023-07-10 DIAGNOSIS — E785 Hyperlipidemia, unspecified: Secondary | ICD-10-CM

## 2023-07-10 DIAGNOSIS — Z952 Presence of prosthetic heart valve: Secondary | ICD-10-CM | POA: Diagnosis not present

## 2023-07-10 DIAGNOSIS — E1169 Type 2 diabetes mellitus with other specified complication: Secondary | ICD-10-CM | POA: Diagnosis not present

## 2023-07-10 DIAGNOSIS — J9611 Chronic respiratory failure with hypoxia: Secondary | ICD-10-CM

## 2023-07-10 DIAGNOSIS — I4811 Longstanding persistent atrial fibrillation: Secondary | ICD-10-CM | POA: Diagnosis not present

## 2023-07-10 DIAGNOSIS — I4819 Other persistent atrial fibrillation: Secondary | ICD-10-CM

## 2023-07-10 DIAGNOSIS — I480 Paroxysmal atrial fibrillation: Secondary | ICD-10-CM

## 2023-07-10 MED ORDER — HYDRALAZINE HCL 100 MG PO TABS: 100.0000 mg | ORAL_TABLET | Freq: Two times a day (BID) | ORAL | 3 refills | Status: DC

## 2023-07-10 MED ORDER — ISOSORBIDE MONONITRATE ER 60 MG PO TB24: 60.0000 mg | ORAL_TABLET | Freq: Every day | ORAL | 3 refills | Status: AC

## 2023-07-10 NOTE — Patient Instructions (Addendum)
 Medication Instructions:    Your weight at Home should be 180 lbs-- Follow these instruction for next  7 days to decrease  your home weight on your scales Day 1 - take 40 mg ( 2 tablets of Torsemide )  in the morning  and 40 mg  ( 2 tablets of Torsemide ) early afternoon Day 2  take 40 mg  ( 2 tablets of torsemide ) in the morning only  Day 3  take  40 mg ( 2 tablets of Torsemide )  in the morning  and 40 mg ( 2 tablets of Torsemide )  early afternoon Day 4 take 40 mg  ( 2 tablets of torsemide ) in the morning only  Day 5 take  40 mg ( 2 tablets of Torsemide )  in the morning  and 20 mg ( 1 tablet of Torsemide )  early afternoon Day 6 take 40 mg  ( 2 tablets of torsemide ) in the morning only   Day 7  take  40 mg ( 2 tablets of Torsemide )  in the morning  and 20 mg ( 1 tablet of Torsemide )  early afternoon  Then return To 40 mg  daily   2 tablets of Torsemide )   Hydralazine  100 mg three times a day  *If you need a refill on your cardiac medications before your next appointment, please call your pharmacy*   Lab Work: Not needed    Testing/Procedures: Not needed   Follow-Up: At [facility] [place], you and your health needs are our priority.  As part of our continuing mission to provide you with exceptional heart care, we have created designated Provider Care Teams.  These Care Teams include your primary Cardiologist (physician) and Advanced Practice Providers (APPs -  Physician Assistants and Nurse Practitioners) who all work together to provide you with the care you need, when you need it.     Your next appointment:   3 month(s)  The format for your next appointment:   In Person  Provider:   Randene Bustard, MD  Keep Your appointment with Beverely Buba In June 2023

## 2023-07-10 NOTE — Progress Notes (Signed)
 Complex Care Management Note  Care Guide Note 07/10/2023 Name: Mary Mccarthy MRN: [id number] DOB: 10/19/1940  Mary Mccarthy is a 83 y.o. year old female who sees Roslyn Coombe, MD for primary care. I reached out to Mary Mccarthy by phone today to offer complex care management services.  Mary Mccarthy was given information about Complex Care Management services today including:   The Complex Care Management services include support from the care team which includes your Nurse Care Manager, Clinical Social Worker, or Pharmacist.  The Complex Care Management team is here to help remove barriers to the health concerns and goals most important to you. Complex Care Management services are voluntary, and the patient may decline or stop services at any time by request to their care team member.   Complex Care Management Consent Status: Patient agreed to services and verbal consent obtained.   Follow up plan:  Telephone appointment with complex care management team member scheduled for:  07/26/2023  Encounter Outcome:  Patient Scheduled  Kandis Ormond, CMA [place]  [facility], [facility] Guide Direct Dial: [phone]  Fax: [phone] Website: [email or web address]

## 2023-07-11 ENCOUNTER — Telehealth: Payer: Self-pay | Admitting: Internal Medicine

## 2023-07-11 DIAGNOSIS — I700 Atherosclerosis of aorta: Secondary | ICD-10-CM | POA: Diagnosis not present

## 2023-07-11 DIAGNOSIS — I4819 Other persistent atrial fibrillation: Secondary | ICD-10-CM | POA: Diagnosis not present

## 2023-07-11 DIAGNOSIS — N179 Acute kidney failure, unspecified: Secondary | ICD-10-CM | POA: Diagnosis not present

## 2023-07-11 DIAGNOSIS — E538 Deficiency of other specified B group vitamins: Secondary | ICD-10-CM | POA: Diagnosis not present

## 2023-07-11 DIAGNOSIS — I130 Hypertensive heart and chronic kidney disease with heart failure and stage 1 through stage 4 chronic kidney disease, or unspecified chronic kidney disease: Secondary | ICD-10-CM | POA: Diagnosis not present

## 2023-07-11 DIAGNOSIS — I272 Pulmonary hypertension, unspecified: Secondary | ICD-10-CM | POA: Diagnosis not present

## 2023-07-11 DIAGNOSIS — N184 Chronic kidney disease, stage 4 (severe): Secondary | ICD-10-CM | POA: Diagnosis not present

## 2023-07-11 DIAGNOSIS — E7849 Other hyperlipidemia: Secondary | ICD-10-CM | POA: Diagnosis not present

## 2023-07-11 DIAGNOSIS — J9611 Chronic respiratory failure with hypoxia: Secondary | ICD-10-CM

## 2023-07-11 DIAGNOSIS — E1169 Type 2 diabetes mellitus with other specified complication: Secondary | ICD-10-CM | POA: Diagnosis not present

## 2023-07-11 DIAGNOSIS — E039 Hypothyroidism, unspecified: Secondary | ICD-10-CM | POA: Diagnosis not present

## 2023-07-11 DIAGNOSIS — E1165 Type 2 diabetes mellitus with hyperglycemia: Secondary | ICD-10-CM | POA: Diagnosis not present

## 2023-07-11 DIAGNOSIS — I5033 Acute on chronic diastolic (congestive) heart failure: Secondary | ICD-10-CM | POA: Diagnosis not present

## 2023-07-11 DIAGNOSIS — I160 Hypertensive urgency: Secondary | ICD-10-CM | POA: Diagnosis not present

## 2023-07-11 DIAGNOSIS — E1122 Type 2 diabetes mellitus with diabetic chronic kidney disease: Secondary | ICD-10-CM | POA: Diagnosis not present

## 2023-07-11 DIAGNOSIS — K219 Gastro-esophageal reflux disease without esophagitis: Secondary | ICD-10-CM | POA: Diagnosis not present

## 2023-07-11 DIAGNOSIS — Z8673 Personal history of transient ischemic attack (TIA), and cerebral infarction without residual deficits: Secondary | ICD-10-CM

## 2023-07-11 DIAGNOSIS — J9601 Acute respiratory failure with hypoxia: Secondary | ICD-10-CM | POA: Diagnosis not present

## 2023-07-11 DIAGNOSIS — D509 Iron deficiency anemia, unspecified: Secondary | ICD-10-CM | POA: Diagnosis not present

## 2023-07-11 DIAGNOSIS — I2510 Atherosclerotic heart disease of native coronary artery without angina pectoris: Secondary | ICD-10-CM | POA: Diagnosis not present

## 2023-07-11 DIAGNOSIS — J690 Pneumonitis due to inhalation of food and vomit: Secondary | ICD-10-CM | POA: Diagnosis not present

## 2023-07-11 DIAGNOSIS — M810 Age-related osteoporosis without current pathological fracture: Secondary | ICD-10-CM | POA: Diagnosis not present

## 2023-07-11 NOTE — Telephone Encounter (Signed)
 Copied from CRM [id number]. Topic: Clinical - Order For Equipment >> Jul 11, 2023 11:18 AM [place] E wrote: Reason for CRM: Josiah Nigh, OT with [employer] called in asking if patient could get an order for a "Drive medical poly-fly wheelchair and transport chair" faxed over to [employer] at [phone]. Callback number for Josiah Nigh is [phone].

## 2023-07-12 ENCOUNTER — Telehealth: Payer: Self-pay | Admitting: Cardiology

## 2023-07-12 MED ORDER — HYDRALAZINE HCL 100 MG PO TABS: 100.0000 mg | ORAL_TABLET | Freq: Three times a day (TID) | ORAL | 3 refills | Status: AC

## 2023-07-12 NOTE — Telephone Encounter (Addendum)
 Patient identification verified by 2 forms. Mary Duck, RN    [address] back and [name]. No answer. [id number]    Verified in 6/3 office note that patient is to be taking hydralazine  100 mg TID.  New prescription re-sent to pharmacy.   If patient calls back she can speak to any triage nurse regarding medication change.

## 2023-07-12 NOTE — Telephone Encounter (Signed)
 Pt c/o medication issue:  1. Name of Medication:  hydrALAZINE  (APRESOLINE ) 100 MG tablet  2. How are you currently taking this medication (dosage and times per day)?   3. Are you having a reaction (difficulty breathing--STAT)?   4. What is your medication issue?   Patient's son says during appointment he was advised the medication would be doubled from 50 MG 3x daily to 100 MG 3x daily, but the prescription was written for 100 MG 2x daily. Please advise.

## 2023-07-12 NOTE — Telephone Encounter (Signed)
Ok done hardcopy to cma 

## 2023-07-14 ENCOUNTER — Encounter: Payer: Self-pay | Admitting: Cardiology

## 2023-07-14 DIAGNOSIS — Z7189 Other specified counseling: Secondary | ICD-10-CM | POA: Insufficient documentation

## 2023-07-14 NOTE — Assessment & Plan Note (Signed)
 Remains on atorvastatin  40 mg daily.  I do not see recent labs.  Should be due for follow-up labs. Diabetes managed by PCP.  Currently on Lantus  insulin . SGLT2 inhibitor not being used mostly because of recurrent UTIs.

## 2023-07-14 NOTE — Assessment & Plan Note (Signed)
 Discussed palliative care and hospice. Emphasized quality of life, preference to remain at home, and DNR in place. - Consider palliative care services to manage symptoms and prevent hospitalizations. - Maintain current living situation with family and healthcare support.

## 2023-07-14 NOTE — Assessment & Plan Note (Signed)
 Previously managed with amiodarone  for rhythm control.  Now stopped because of concerns for pulmonary toxicity.  Remains on Toprol -XL 12.5 mg daily for rate control-with resting heart rates in the 60s.  No room to titrate further.  Limited options for rhythm control now that she is no longer amiodarone . Thankfully, she only had 1 brief episode that suggest a possible recurrence of A-fib and spontaneously converted back.  Currently in sinus rhythm.  No longer on DOAC or even aspirin  because of concerns for recurrent bleeding requiring transfusion.

## 2023-07-14 NOTE — Assessment & Plan Note (Signed)
 Due to recurrent GI bleeds, no longer on DOAC or aspirin .

## 2023-07-14 NOTE — Assessment & Plan Note (Signed)
 Creatinine levels 1.64-2.2, recent improvement to 1.64.  CKD Stage 4 not imminent if creatinine <2.0.  Importance of hydration emphasized. - Monitor creatinine levels regularly. - Maintain hydration without overhydration. - Adjust diuretic dosage based on weight and fluid status.

## 2023-07-14 NOTE — Assessment & Plan Note (Addendum)
 NYHA class II-III symptoms with recent exacerbation. Dyspnea seems to be stable but her weights are up and edema is worse. Weight increased to 184 lbs, target 178 lbs. - Titrate hydralazine  to 100 mg 3 times daily and Imdur  to 60 mg daily - Use support socks to manage swelling. - Adjust torsemide  dosage: double for two days if weight >182 lbs, then regular dose. - Monitor weight regularly, aim for target weight of 178 lbs.  For the next 7 days: Day 1 - take 40 mg ( 2 tablets of Torsemide )  in the morning  and 40 mg  ( 2 tablets of Torsemide ) early afternoon Day 2  take 40 mg  ( 2 tablets of torsemide ) in the morning only  Day 3  take  40 mg ( 2 tablets of Torsemide )  in the morning  and 40 mg ( 2 tablets of Torsemide )  early afternoon Day 4 take 40 mg  ( 2 tablets of torsemide ) in the morning only  Day 5 take  40 mg ( 2 tablets of Torsemide )  in the morning  and 20 mg ( 1 tablet of Torsemide )  early afternoon Day 6 take 40 mg  ( 2 tablets of torsemide ) in the morning only   Day 7  take  40 mg ( 2 tablets of Torsemide )  in the morning  and 20 mg ( 1 tablet of Torsemide )  early afternoon  Then return To 40 mg  daily   2 tablets of Torsemide )

## 2023-07-14 NOTE — Assessment & Plan Note (Signed)
 Not on DOAC or aspirin  because of recurrent GI bleeds.

## 2023-07-14 NOTE — Assessment & Plan Note (Signed)
 Chronic pulmonary disease with stable oxygen  requirements.  Completed steroid dose for possible pulmonary toxicity from amiodarone . Amiodarone  discontinued for AFib control due to concerns for pulmonary toxicity. - Per pulmonary medicine, periodically go without oxygen  while sitting, monitor pulse oximetry. - Ensure oxygen  saturation remains above 90%.

## 2023-07-14 NOTE — Assessment & Plan Note (Signed)
 Slight leakage with high right ventricular and atrial pressures due to volume overload.  Stable mean gradient of 9 post-TAVR. - Monitor valve function and volume status => anticipate rechecking echocardiogram sooner than 1 year to reassess PVL (will forward echo to valve team) - Manage blood pressure to reduce valve strain.

## 2023-07-14 NOTE — Assessment & Plan Note (Addendum)
 Recent readings in 150s-160s. Blood pressure control crucial to prevent CKD and cardiac issues. Imdur  and hydralazine  used. Because of renal insufficiency, no longer on ARB-converted to hydralazine /nitrate - Increase hydralazine  dosage to double current dose = 100 mg 3 times daily; along with increasing Imdur  to 60 mg daily - Consider reintroducing amlodipine  and potentially converting back to ARB if blood pressure remains uncontrolled. - Is on same dose torsemide  with dosing noted in the CHF segment.

## 2023-07-14 NOTE — Assessment & Plan Note (Signed)
 Nonocclusive CAD noted on cath.  Continue risk factor modification with statin, beta-blocker, Imdur  and diabetic control.

## 2023-07-14 NOTE — Assessment & Plan Note (Signed)
 Noted on recent Echo.  Probably related to diastolic dysfunction but also underlying pulmonary disease.  Continue to titrate medications for CHF along with standing diuretic dose with sliding scale.  For now we will hold off on invasive evaluation unless symptoms worsen.

## 2023-07-16 DIAGNOSIS — E7849 Other hyperlipidemia: Secondary | ICD-10-CM | POA: Diagnosis not present

## 2023-07-16 DIAGNOSIS — D509 Iron deficiency anemia, unspecified: Secondary | ICD-10-CM | POA: Diagnosis not present

## 2023-07-16 DIAGNOSIS — M810 Age-related osteoporosis without current pathological fracture: Secondary | ICD-10-CM | POA: Diagnosis not present

## 2023-07-16 DIAGNOSIS — I160 Hypertensive urgency: Secondary | ICD-10-CM | POA: Diagnosis not present

## 2023-07-16 DIAGNOSIS — E1169 Type 2 diabetes mellitus with other specified complication: Secondary | ICD-10-CM | POA: Diagnosis not present

## 2023-07-16 DIAGNOSIS — K219 Gastro-esophageal reflux disease without esophagitis: Secondary | ICD-10-CM | POA: Diagnosis not present

## 2023-07-16 DIAGNOSIS — I2510 Atherosclerotic heart disease of native coronary artery without angina pectoris: Secondary | ICD-10-CM | POA: Diagnosis not present

## 2023-07-16 DIAGNOSIS — J9601 Acute respiratory failure with hypoxia: Secondary | ICD-10-CM | POA: Diagnosis not present

## 2023-07-16 DIAGNOSIS — J690 Pneumonitis due to inhalation of food and vomit: Secondary | ICD-10-CM | POA: Diagnosis not present

## 2023-07-16 DIAGNOSIS — I4819 Other persistent atrial fibrillation: Secondary | ICD-10-CM | POA: Diagnosis not present

## 2023-07-16 DIAGNOSIS — I272 Pulmonary hypertension, unspecified: Secondary | ICD-10-CM | POA: Diagnosis not present

## 2023-07-16 DIAGNOSIS — N184 Chronic kidney disease, stage 4 (severe): Secondary | ICD-10-CM | POA: Diagnosis not present

## 2023-07-16 DIAGNOSIS — N179 Acute kidney failure, unspecified: Secondary | ICD-10-CM | POA: Diagnosis not present

## 2023-07-16 DIAGNOSIS — E1165 Type 2 diabetes mellitus with hyperglycemia: Secondary | ICD-10-CM | POA: Diagnosis not present

## 2023-07-16 DIAGNOSIS — E538 Deficiency of other specified B group vitamins: Secondary | ICD-10-CM | POA: Diagnosis not present

## 2023-07-16 DIAGNOSIS — I5033 Acute on chronic diastolic (congestive) heart failure: Secondary | ICD-10-CM | POA: Diagnosis not present

## 2023-07-16 DIAGNOSIS — I130 Hypertensive heart and chronic kidney disease with heart failure and stage 1 through stage 4 chronic kidney disease, or unspecified chronic kidney disease: Secondary | ICD-10-CM | POA: Diagnosis not present

## 2023-07-16 DIAGNOSIS — E1122 Type 2 diabetes mellitus with diabetic chronic kidney disease: Secondary | ICD-10-CM | POA: Diagnosis not present

## 2023-07-16 DIAGNOSIS — E039 Hypothyroidism, unspecified: Secondary | ICD-10-CM | POA: Diagnosis not present

## 2023-07-16 DIAGNOSIS — I700 Atherosclerosis of aorta: Secondary | ICD-10-CM | POA: Diagnosis not present

## 2023-07-16 NOTE — Telephone Encounter (Signed)
 Order has been faxed today.

## 2023-07-17 ENCOUNTER — Ambulatory Visit: Payer: Self-pay

## 2023-07-17 ENCOUNTER — Emergency Department (HOSPITAL_COMMUNITY)
Admission: EM | Admit: 2023-07-17 | Discharge: 2023-07-17 | Disposition: A | Discharge status: Home / Self Care | Attending: Emergency Medicine | Admitting: Emergency Medicine

## 2023-07-17 DIAGNOSIS — R739 Hyperglycemia, unspecified: Secondary | ICD-10-CM | POA: Insufficient documentation

## 2023-07-17 DIAGNOSIS — E1165 Type 2 diabetes mellitus with hyperglycemia: Secondary | ICD-10-CM | POA: Diagnosis not present

## 2023-07-17 DIAGNOSIS — Z794 Long term (current) use of insulin: Secondary | ICD-10-CM | POA: Diagnosis not present

## 2023-07-17 DIAGNOSIS — I509 Heart failure, unspecified: Secondary | ICD-10-CM | POA: Insufficient documentation

## 2023-07-17 DIAGNOSIS — R269 Unspecified abnormalities of gait and mobility: Secondary | ICD-10-CM | POA: Diagnosis not present

## 2023-07-17 DIAGNOSIS — I639 Cerebral infarction, unspecified: Secondary | ICD-10-CM | POA: Diagnosis not present

## 2023-07-17 DIAGNOSIS — R2681 Unsteadiness on feet: Secondary | ICD-10-CM | POA: Diagnosis not present

## 2023-07-17 LAB — I-STAT VENOUS BLOOD GAS, ED
Acid-Base Excess: 6 mmol/L — ABNORMAL HIGH (ref 0.0–2.0)
Bicarbonate: 29.7 mmol/L — ABNORMAL HIGH (ref 20.0–28.0)
Calcium, Ion: 1.13 mmol/L — ABNORMAL LOW (ref 1.15–1.40)
HCT: 26 % — ABNORMAL LOW (ref 36.0–46.0)
Hemoglobin: 8.8 g/dL — ABNORMAL LOW (ref 12.0–15.0)
O2 Saturation: 92 %
Potassium: 4.3 mmol/L (ref 3.5–5.1)
Sodium: 135 mmol/L (ref 135–145)
TCO2: 31 mmol/L (ref 22–32)
pCO2, Ven: 36.6 mmHg — ABNORMAL LOW (ref 44–60)
pH, Ven: 7.517 — ABNORMAL HIGH (ref 7.25–7.43)
pO2, Ven: 56 mmHg — ABNORMAL HIGH (ref 32–45)

## 2023-07-17 LAB — CBC WITH DIFFERENTIAL/PLATELET
Abs Immature Granulocytes: 0.06 10*3/uL (ref 0.00–0.07)
Basophils Absolute: 0 10*3/uL (ref 0.0–0.1)
Basophils Relative: 0 %
Eosinophils Absolute: 0 10*3/uL (ref 0.0–0.5)
Eosinophils Relative: 0 %
HCT: 28.6 % — ABNORMAL LOW (ref 36.0–46.0)
Hemoglobin: 9.2 g/dL — ABNORMAL LOW (ref 12.0–15.0)
Immature Granulocytes: 1 %
Lymphocytes Relative: 19 %
Lymphs Abs: 1.5 10*3/uL (ref 0.7–4.0)
MCH: 32.1 pg (ref 26.0–34.0)
MCHC: 32.2 g/dL (ref 30.0–36.0)
MCV: 99.7 fL (ref 80.0–100.0)
Monocytes Absolute: 0.3 10*3/uL (ref 0.1–1.0)
Monocytes Relative: 4 %
Neutro Abs: 6.1 10*3/uL (ref 1.7–7.7)
Neutrophils Relative %: 76 %
Platelets: 65 10*3/uL — ABNORMAL LOW (ref 150–400)
RBC: 2.87 MIL/uL — ABNORMAL LOW (ref 3.87–5.11)
RDW: 19 % — ABNORMAL HIGH (ref 11.5–15.5)
WBC: 8 10*3/uL (ref 4.0–10.5)
nRBC: 0 % (ref 0.0–0.2)

## 2023-07-17 LAB — COMPREHENSIVE METABOLIC PANEL WITH GFR
ALT: 43 U/L (ref 0–44)
AST: 28 U/L (ref 15–41)
Albumin: 2.5 g/dL — ABNORMAL LOW (ref 3.5–5.0)
Alkaline Phosphatase: 64 U/L (ref 38–126)
Anion gap: 10 (ref 5–15)
BUN: 42 mg/dL — ABNORMAL HIGH (ref 8–23)
CO2: 27 mmol/L (ref 22–32)
Calcium: 8.6 mg/dL — ABNORMAL LOW (ref 8.9–10.3)
Chloride: 98 mmol/L (ref 98–111)
Creatinine, Ser: 2.06 mg/dL — ABNORMAL HIGH (ref 0.44–1.00)
GFR, Estimated: 23 mL/min — ABNORMAL LOW (ref >60)
Glucose, Bld: 426 mg/dL — ABNORMAL HIGH (ref 70–99)
Potassium: 4.3 mmol/L (ref 3.5–5.1)
Sodium: 135 mmol/L (ref 135–145)
Total Bilirubin: 1.4 mg/dL — ABNORMAL HIGH (ref 0.0–1.2)
Total Protein: 5.8 g/dL — ABNORMAL LOW (ref 6.5–8.1)

## 2023-07-17 LAB — URINALYSIS, ROUTINE W REFLEX MICROSCOPIC
Bacteria, UA: NONE SEEN
Bilirubin Urine: NEGATIVE
Glucose, UA: >=500 mg/dL — AB
Hgb urine dipstick: NEGATIVE
Ketones, ur: NEGATIVE mg/dL
Leukocytes,Ua: NEGATIVE
Nitrite: NEGATIVE
Protein, ur: NEGATIVE mg/dL
Specific Gravity, Urine: 1.01 (ref 1.005–1.030)
pH: 7 (ref 5.0–8.0)

## 2023-07-17 LAB — CBG MONITORING, ED
Glucose-Capillary: 416 mg/dL — ABNORMAL HIGH (ref 70–99)
Glucose-Capillary: 304 mg/dL — ABNORMAL HIGH (ref 70–99)
Glucose-Capillary: 173 mg/dL — ABNORMAL HIGH (ref 70–99)

## 2023-07-17 MED ORDER — INSULIN ASPART 100 UNIT/ML IJ SOLN
5.0000 [IU] | Freq: Once | INTRAMUSCULAR | Status: AC
  Administered 2023-07-17: 5 [IU] via INTRAVENOUS

## 2023-07-17 NOTE — Telephone Encounter (Signed)
 FYI Only or Action Required?: FYI only for provider  Patient was last seen in primary care on 06/27/2023 by Roslyn Coombe, MD. Called Nurse Triage reporting Hyperglycemia. Symptoms began today. Interventions attempted: Prescription medications: insulin . Symptoms are: gradually worsening.  Triage Disposition: Go to ED or PCP/Alternative with Approval  Patient/caregiver understands and will follow disposition?: Yes    Copied from CRM [id number]. Topic: Clinical - Red Word Triage >> Jul 17, 2023  5:37 PM Shereese L wrote: [facility] that prompted transfer to Nurse Triage: patient has a sugar levels are at 506 Reason for Disposition  Blood glucose > 500 mg/dL (44.0 mmol/L)  Answer Assessment - Initial Assessment Questions 1. BLOOD GLUCOSE: "What is your blood glucose level?"      506 2. ONSET: "When did you check the blood glucose?"     Just before call  Protocols used: Diabetes - High Blood Sugar-A-AH

## 2023-07-17 NOTE — Discharge Instructions (Addendum)
 Overall blood work looked good except for your elevated sugar.  You will most likely need to adjust your Lantus  since they do not want you taking the metformin  any longer.  You also will need a repeat blood draw to recheck your CBC and BMP which will look at your platelets and your kidney function.  It is okay to eat when you get home tonight.  Avoid sugary drinks or foods as well as foods with significant amount of carbohydrates.

## 2023-07-17 NOTE — ED Provider Triage Note (Signed)
 Emergency Medicine Provider Triage Evaluation Note  Mary Mccarthy , a 83 y.o. female  was evaluated in triage.  Pt complains of feeling a little off today. Dc from hospital 06/07/23 following admission for afib etc. Has been doing ok at home until today. Blood sugar was elevated today, took her usual insulin  dose this morning. Blood sugar elevated earlier today "high" recheck 502. Called PCP office and advised to go to ER. Denies fevers, cough, SHOB, CP, urinary symptoms. Hx afib, not on thinners.   Review of Systems  Positive:  Negative: Unilateral weakness, numbness, changes in speech/gait  Physical Exam  BP (!) 166/58 (BP Location: Right Arm)   Pulse 67   Temp 98.6 F (37 C)   Resp (!) 22   SpO2 97%  Gen:   Awake, no distress   Resp:  Normal effort  MSK:   Moves extremities without difficulty  Other:    Medical Decision Making  Medically screening exam initiated at 6:38 PM.  Appropriate orders placed.  Mary Mccarthy was informed that the remainder of the evaluation will be completed by another provider, this initial triage assessment does not replace that evaluation, and the importance of remaining in the ED until their evaluation is complete.     Darlis Eisenmenger, PA-C 07/17/23 1840

## 2023-07-17 NOTE — ED Provider Notes (Addendum)
 [place] EMERGENCY DEPARTMENT AT [facility] Provider Note   CSN: [id number] Arrival date & time: 07/17/23  1827     History  Chief Complaint  Patient presents with   Hyperglycemia    Mary Mccarthy is a 83 y.o. female.  Pt is a 83 y.o. female here with recent hospn apr 7 - may 1 with hypoxic resp failure, possible amiodarone  lung toxicity, CHF, PAF and hospitalization for a month due to aspirate pna dc/ed 1 month ago with chronic steroid use still weaning down and on daily single injection insulin  who was taken off metformin  3 weeks ago due to renal functions who is presenting today due to hyperglycemia.  She reports her blood sugar has been running higher since stopping the metformin  but usually only 200 however today her blood sugars have been in the 400s.  She has continued using her insulin  and denies any dietary changes.  She did see her cardiologist last week due to worsening swelling and had her diuretic increased and reports significant improvement in her swelling and weight loss.  She currently denies any fever, new productive cough and no new shortness of breath.  She has not had any abdominal pain nausea or vomiting.  Denies any urinary symptoms.  She has been eating and drinking regularly.  The history is provided by the patient and a relative.  Hyperglycemia      Home Medications Prior to Admission medications   Medication Sig Start Date End Date Taking? Authorizing Provider  acetaminophen  (TYLENOL ) 500 MG tablet Take 500 mg by mouth every 6 (six) hours as needed for moderate pain.    [provider]  ALPRAZolam  (XANAX ) 0.25 MG tablet Take 1 tablet (0.25 mg total) by mouth 3 (three) times daily as needed for anxiety. 03/14/23   Roslyn Coombe, MD  atorvastatin  (LIPITOR ) 40 MG tablet TAKE 1 TABLET BY MOUTH EVERY DAY 09/04/22   Arleen Lacer, MD  [employer] VITAMIN B12 1000 MCG tablet TAKE 1 TABLET BY MOUTH EVERY DAY 03/26/23   Roslyn Coombe, MD   hydrALAZINE  (APRESOLINE ) 100 MG tablet Take 1 tablet (100 mg total) by mouth 3 (three) times daily. 07/12/23   Arleen Lacer, MD  insulin  glargine (LANTUS ) 100 UNIT/ML Solostar Pen Inject 40 Units into the skin daily. 06/27/23   Roslyn Coombe, MD  isosorbide  mononitrate (IMDUR ) 60 MG 24 hr tablet Take 1 tablet (60 mg total) by mouth daily. 07/10/23 10/08/23  Arleen Lacer, MD  levothyroxine  (SYNTHROID ) 25 MCG tablet TAKE 1 TABLET BY MOUTH EVERY DAY 03/26/23   Roslyn Coombe, MD  metoprolol  succinate (TOPROL -XL) 25 MG 24 hr tablet Take 0.5 tablets (12.5 mg total) by mouth daily. 06/12/23   Roslyn Coombe, MD  mirtazapine  (REMERON ) 15 MG tablet TAKE 1 TABLET BY MOUTH EVERYDAY AT BEDTIME 10/30/22   Roslyn Coombe, MD  pantoprazole  (PROTONIX ) 40 MG tablet TAKE 1 TABLET BY MOUTH TWICE A DAY 12/07/22   Ardia Kraft, PA-C  predniSONE  (DELTASONE ) 20 MG tablet Take 2 tablets (40 mg total) by mouth daily with breakfast. Patient taking differently: Take 30 mg by mouth daily with breakfast. Tapering down to discontinue this medication. Currently on the 1.5 tablets. 06/08/23   Deforest Fast, MD  solifenacin  (VESICARE ) 5 MG tablet Take 1 tablet (5 mg total) by mouth daily. 03/14/23   Roslyn Coombe, MD  torsemide  (DEMADEX ) 20 MG tablet Take 2 tablets (40 mg total) by mouth daily. 06/07/23  Deforest Fast, MD      Allergies    Ibuprofen and Amiodarone     Review of Systems   Review of Systems  Physical Exam Updated Vital Signs BP (!) 166/58 (BP Location: Right Arm)   Pulse 67   Temp 98.6 F (37 C)   Resp (!) 22   Ht 5\' 1"  (1.549 m)   Wt 80.9 kg   SpO2 97%   BMI 33.71 kg/m  Physical Exam Vitals and nursing note reviewed.  Constitutional:      General: She is not in acute distress.    Appearance: She is well-developed.  HENT:     Head: Normocephalic and atraumatic.  Eyes:     Pupils: Pupils are equal, round, and reactive to light.  Cardiovascular:     Rate and Rhythm: Regular rhythm. Bradycardia  present.     Pulses: Normal pulses.     Heart sounds: Murmur heard.     No friction rub.  Pulmonary:     Effort: Pulmonary effort is normal.     Breath sounds: Normal breath sounds. No wheezing or rales.  Abdominal:     General: Bowel sounds are normal. There is no distension.     Palpations: Abdomen is soft.     Tenderness: There is no abdominal tenderness. There is no guarding or rebound.  Musculoskeletal:        General: No tenderness. Normal range of motion.     Right lower leg: Edema present.     Left lower leg: Edema present.     Comments: 1-2+ pitting edema in bilateral lower extremities  Skin:    General: Skin is warm and dry.     Findings: No rash.  Neurological:     Mental Status: She is alert and oriented to person, place, and time. Mental status is at baseline.     Cranial Nerves: No cranial nerve deficit.  Psychiatric:        Behavior: Behavior normal.     ED Results / Procedures / Treatments   Labs (all labs ordered are listed, but only abnormal results are displayed) Labs Reviewed  CBC WITH DIFFERENTIAL/PLATELET - Abnormal; Notable for the following components:      Result Value   RBC 2.87 (*)    Hemoglobin 9.2 (*)    HCT 28.6 (*)    RDW 19.0 (*)    Platelets 65 (*)    All other components within normal limits  COMPREHENSIVE METABOLIC PANEL WITH GFR - Abnormal; Notable for the following components:   Glucose, Bld 426 (*)    BUN 42 (*)    Creatinine, Ser 2.06 (*)    Calcium  8.6 (*)    Total Protein 5.8 (*)    Albumin 2.5 (*)    Total Bilirubin 1.4 (*)    GFR, Estimated 23 (*)    All other components within normal limits  URINALYSIS, ROUTINE W REFLEX MICROSCOPIC - Abnormal; Notable for the following components:   Glucose, UA >=500 (*)    All other components within normal limits  CBG MONITORING, ED - Abnormal; Notable for the following components:   Glucose-Capillary 416 (*)    All other components within normal limits  I-STAT VENOUS BLOOD GAS, ED -  Abnormal; Notable for the following components:   pH, Ven 7.517 (*)    pCO2, Ven 36.6 (*)    pO2, Ven 56 (*)    Bicarbonate 29.7 (*)    Acid-Base Excess 6.0 (*)    Calcium , Ion 1.13 (*)  HCT 26.0 (*)    Hemoglobin 8.8 (*)    All other components within normal limits  CBG MONITORING, ED - Abnormal; Notable for the following components:   Glucose-Capillary 304 (*)    All other components within normal limits    EKG EKG Interpretation Date/Time:  Tuesday July 17 2023 18:58:34 EDT Ventricular Rate:  61 PR Interval:  142 QRS Duration:  104 QT Interval:  469 QTC Calculation: 473 R Axis:   108  Text Interpretation: Sinus rhythm Right axis deviation Nonspecific repol abnormality, inferior leads Borderline ST elevation, anterior leads No significant change since last tracing Confirmed by Almond Army ([id number]) on 07/17/2023 7:06:03 PM  Radiology No results found.  Procedures Procedures    Medications Ordered in ED Medications  insulin  aspart (novoLOG ) injection 5 Units (5 Units Intravenous Given 07/17/23 1959)  insulin  aspart (novoLOG ) injection 5 Units (5 Units Intravenous Given 07/17/23 2104)    ED Course/ Medical Decision Making/ A&P                                 Medical Decision Making Amount and/or Complexity of Data Reviewed Independent Historian: caregiver External Data Reviewed: notes. Labs: ordered. Decision-making details documented in ED Course. ECG/medicine tests: ordered and independent interpretation performed. Decision-making details documented in ED Course.  Risk Prescription drug management.   Pt with multiple medical problems and comorbidities and presenting today with a complaint that caries a high risk for morbidity and mortality.  Here today with worsening hyperglycemia.  This is in the setting of discontinuing metformin  due to fluctuating renal function and no adjustment in her daily insulin .  She otherwise reports feeling okay.  No focal  infectious etiology.  Seems that symptoms of fluid overload are improving with the adjustments she has done at home with her diuretic.  Patient is also still tapering down on prednisone  which would also affect her blood sugar.  Blood sugar here was 416 and she was given some IV insulin .   I independently interpreted patient's labs and EKG and CBC today with platelet count of 65 which is new but hemoglobin is stable and slightly improved at 9.2, white count within normal limits, CMP with hyperglycemia with a blood sugar of 426 but normal electrolytes.  Creatinine is 2.06 from 1.6 at the end of May however may be related to increased diuresis due to fluid overload but patient's range over the last 6 months has been 1.3-3.  Will have patient call her doctor tomorrow about increasing her Lantus  dose to help account for the elevated sugars.  She will need her creatinine rechecked at the end of the week to ensure that it is remaining stable.  Discussed all this with the patient and her son.  They are comfortable with this plan.  They will call Dr. Koleen Perna office tomorrow.         Final Clinical Impression(s) / ED Diagnoses Final diagnoses:  Hyperglycemia    Rx / DC Orders ED Discharge Orders     None         Almond Army, MD 07/17/23 2121    Almond Army, MD 07/17/23 2122

## 2023-07-17 NOTE — ED Triage Notes (Addendum)
 Patient reports that she feels "off" intermittently and that her CBG was 422 this morning, 416 in triage. Family reports she was taken of her metformin  a week ago. Patient checked her CBG this afternoon, read 506, called her dr's office and they told her to come here. No pain, A&Ox4 in triage, wearing BL O2, denies SHOB, dizziness, and unilateral weakness.

## 2023-07-18 DIAGNOSIS — E1165 Type 2 diabetes mellitus with hyperglycemia: Secondary | ICD-10-CM | POA: Diagnosis not present

## 2023-07-18 DIAGNOSIS — E1122 Type 2 diabetes mellitus with diabetic chronic kidney disease: Secondary | ICD-10-CM | POA: Diagnosis not present

## 2023-07-18 DIAGNOSIS — J690 Pneumonitis due to inhalation of food and vomit: Secondary | ICD-10-CM | POA: Diagnosis not present

## 2023-07-18 DIAGNOSIS — I160 Hypertensive urgency: Secondary | ICD-10-CM | POA: Diagnosis not present

## 2023-07-18 DIAGNOSIS — J9601 Acute respiratory failure with hypoxia: Secondary | ICD-10-CM | POA: Diagnosis not present

## 2023-07-18 DIAGNOSIS — K219 Gastro-esophageal reflux disease without esophagitis: Secondary | ICD-10-CM | POA: Diagnosis not present

## 2023-07-18 DIAGNOSIS — N184 Chronic kidney disease, stage 4 (severe): Secondary | ICD-10-CM | POA: Diagnosis not present

## 2023-07-18 DIAGNOSIS — I2510 Atherosclerotic heart disease of native coronary artery without angina pectoris: Secondary | ICD-10-CM | POA: Diagnosis not present

## 2023-07-18 DIAGNOSIS — M810 Age-related osteoporosis without current pathological fracture: Secondary | ICD-10-CM | POA: Diagnosis not present

## 2023-07-18 DIAGNOSIS — I5033 Acute on chronic diastolic (congestive) heart failure: Secondary | ICD-10-CM | POA: Diagnosis not present

## 2023-07-18 DIAGNOSIS — I700 Atherosclerosis of aorta: Secondary | ICD-10-CM | POA: Diagnosis not present

## 2023-07-18 DIAGNOSIS — E1169 Type 2 diabetes mellitus with other specified complication: Secondary | ICD-10-CM | POA: Diagnosis not present

## 2023-07-18 DIAGNOSIS — E039 Hypothyroidism, unspecified: Secondary | ICD-10-CM | POA: Diagnosis not present

## 2023-07-18 DIAGNOSIS — E538 Deficiency of other specified B group vitamins: Secondary | ICD-10-CM | POA: Diagnosis not present

## 2023-07-18 DIAGNOSIS — D509 Iron deficiency anemia, unspecified: Secondary | ICD-10-CM | POA: Diagnosis not present

## 2023-07-18 DIAGNOSIS — E7849 Other hyperlipidemia: Secondary | ICD-10-CM | POA: Diagnosis not present

## 2023-07-18 DIAGNOSIS — I272 Pulmonary hypertension, unspecified: Secondary | ICD-10-CM | POA: Diagnosis not present

## 2023-07-18 DIAGNOSIS — I4819 Other persistent atrial fibrillation: Secondary | ICD-10-CM | POA: Diagnosis not present

## 2023-07-18 DIAGNOSIS — N179 Acute kidney failure, unspecified: Secondary | ICD-10-CM | POA: Diagnosis not present

## 2023-07-18 DIAGNOSIS — I130 Hypertensive heart and chronic kidney disease with heart failure and stage 1 through stage 4 chronic kidney disease, or unspecified chronic kidney disease: Secondary | ICD-10-CM | POA: Diagnosis not present

## 2023-07-19 ENCOUNTER — Ambulatory Visit: Payer: Self-pay | Admitting: Family

## 2023-07-19 ENCOUNTER — Ambulatory Visit: Admitting: Family

## 2023-07-19 VITALS — BP 140/58 | HR 61 | Temp 97.5°F | Ht 61.0 in | Wt 177.8 lb

## 2023-07-19 DIAGNOSIS — Z09 Encounter for follow-up examination after completed treatment for conditions other than malignant neoplasm: Secondary | ICD-10-CM

## 2023-07-19 DIAGNOSIS — I10 Essential (primary) hypertension: Secondary | ICD-10-CM

## 2023-07-19 DIAGNOSIS — N183 Chronic kidney disease, stage 3 unspecified: Secondary | ICD-10-CM

## 2023-07-19 DIAGNOSIS — E1165 Type 2 diabetes mellitus with hyperglycemia: Secondary | ICD-10-CM | POA: Diagnosis not present

## 2023-07-19 LAB — COMPREHENSIVE METABOLIC PANEL WITH GFR
ALT: 28 U/L (ref 0–35)
AST: 15 U/L (ref 0–37)
Albumin: 2.9 g/dL — ABNORMAL LOW (ref 3.5–5.2)
Alkaline Phosphatase: 55 U/L (ref 39–117)
BUN: 43 mg/dL — ABNORMAL HIGH (ref 6–23)
CO2: 32 meq/L (ref 19–32)
Calcium: 8.8 mg/dL (ref 8.4–10.5)
Chloride: 99 meq/L (ref 96–112)
Creatinine, Ser: 1.77 mg/dL — ABNORMAL HIGH (ref 0.40–1.20)
GFR: 26.02 mL/min — ABNORMAL LOW (ref >60.00)
Glucose, Bld: 194 mg/dL — ABNORMAL HIGH (ref 70–99)
Potassium: 3.5 meq/L (ref 3.5–5.1)
Sodium: 139 meq/L (ref 135–145)
Total Bilirubin: 1.1 mg/dL (ref 0.2–1.2)
Total Protein: 5.9 g/dL — ABNORMAL LOW (ref 6.0–8.3)

## 2023-07-19 LAB — HEMOGLOBIN A1C: Hgb A1c MFr Bld: 7.9 % — ABNORMAL HIGH (ref 4.6–6.5)

## 2023-07-19 MED ORDER — LANCET DEVICE MISC: 1.0000 | Freq: Three times a day (TID) | 0 refills | Status: AC

## 2023-07-19 MED ORDER — BLOOD GLUCOSE MONITORING SUPPL DEVI: 1.0000 | Freq: Three times a day (TID) | 0 refills | Status: DC

## 2023-07-19 MED ORDER — BLOOD GLUCOSE TEST VI STRP: 1.0000 | ORAL_STRIP | Freq: Three times a day (TID) | 0 refills | Status: AC

## 2023-07-19 MED ORDER — LANCETS MISC. MISC: 1.0000 | Freq: Three times a day (TID) | 0 refills | Status: AC

## 2023-07-19 NOTE — Progress Notes (Signed)
 Acute Office Visit  Subjective:     Patient ID: Mary Mccarthy, female    DOB: [birth year], 83 y.o.   MRN: [id number]  Chief Complaint  Patient presents with   Hospitalization Follow-up    Patient here for HFU she went on 6/10. She states she feels better but not great. She mentions last night her sugar level spiked to 522, but is back down to 207 this morning.    HPI Patient is in today for hospital follow-up from July 17, 2023.  Patient reported to the emergency department after seeing a blood sugar level of 522.  She is currently on prednisone  as a result of acute respiratory failure from April that landed her in the hospital for 3 weeks.  She has been titrating down over the last several weeks and has noticed an increase in her glucose since she has been on the prednisone .  She is currently on 3 L of oxygen  nasal cannula left.  The next week she will drop down to 1/2 tablet of prednisone  for 2 weeks and will discontinue thereafter.  She has noticed better blood glucose readings as she has been titrating down off of prednisone .  Review of Systems  Constitutional: Negative.   HENT: Negative.    Respiratory: Negative.    Cardiovascular: Negative.   Musculoskeletal: Negative.   Skin: Negative.   Neurological: Negative.   Endo/Heme/Allergies:        Elevated glucose  Psychiatric/Behavioral: Negative.    All other systems reviewed and are negative.   Past Medical History:  Diagnosis Date   ALLERGIC RHINITIS 06/24/2007   Anemia    AVM (arteriovenous malformation) of colon    Blood transfusion without reported diagnosis 05/2015   COLONIC POLYPS, HX OF 06/24/2007   Coronary artery disease, non-occlusive 05/2015   Trop + w/ Acute Anemia =>CATH: small RI - Ostial 60%, ostial RCA 30% and dLAD 40-50%;; 9/'21: Cor Ca Score 624. Mild (25-49%) prox RCA & LAD,; Moderate (50-69%) Ostial Small RI & prox LCx.     DEPRESSION 10/08/2006   DIABETES MELLITUS, TYPE II 10/05/2006   GERD  06/24/2007   History of CVA (cerebrovascular accident) 08/10/2020   08/2020- found to be in afib with RVR, started on Eliquis  (shower of emboli from A. fib as complication of severe AS)   HYPERLIPIDEMIA 10/08/2006   HYPERTENSION 10/08/2006   INSOMNIA 09/24/2008   Left knee DJD    Nonsustained paroxysmal ventricular tachycardia (HCC) 10/2018   Post-TAVR Zio Patch: Frequent (206) runs of NSVT - Fastest 5 beats 239 bpm. Longest 9.3 Sec avg 135 bpm. (? possibly Afib w/ aberrancy)   Osteoporosis 03/10/2016   PAF (paroxysmal atrial fibrillation) (HCC)    PAF with RVR: CHA2DS2-VASc 9.  On Eliquis  and amiodarone . 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 Abel Hoe & Dr. Sherene Dilling   Severe aortic stenosis 08/2020   Progression from Mod-Severe AS to Severe AS - noted on Echo 08/2020 -> (mean gradient progressed from 34 to 42 mmHg) this was in setting of new onset A. fib RVR, acute diastolic HF and shower of emboli CVA. ->  Referred for TAVR, completed 10/26/2020    Social History   Socioeconomic History   Marital status: Widowed    Spouse name: Not on file   Number of children: 1   Years of education: Not on  file   Highest education level: High school graduate  Occupational History   Occupation: retired - Oceanographer: RETIRED  Tobacco Use   Smoking status: Never    Passive exposure: Past   Smokeless tobacco: Never  Vaping Use   Vaping status: Never Used  Substance and Sexual Activity   Alcohol use: No    Alcohol/week: 0.0 standard drinks of alcohol   Drug use: No   Sexual activity: Not Currently  Other Topics Concern   Not on file  Social History Narrative   Recently widowed--husband died in 20-Dec-2018.      Now lives alone but her son usually stays with her at night.  During the day she has 2 nephews who live nearby to come in  and check on her intermittently.  Also one of her nieces calls routinely.      When her son is home and awake, she may try to walk on a treadmill, but is scared to walk outside.   Social Drivers of Corporate investment banker Strain: Low Risk  (05/17/2023)   Overall Financial Resource Strain (CARDIA)    Difficulty of Paying Living Expenses: Not very hard  Food Insecurity: No Food Insecurity (06/08/2023)   Hunger Vital Sign    Worried About Running Out of Food in the Last Year: Never true    Ran Out of Food in the Last Year: Never true  Transportation Needs: No Transportation Needs (06/08/2023)   PRAPARE - Administrator, Civil Service (Medical): No    Lack of Transportation (Non-Medical): No  Physical Activity: Inactive (05/11/2022)   Exercise Vital Sign    Days of Exercise per Week: 0 days    Minutes of Exercise per Session: 0 min  Stress: No Stress Concern Present (05/11/2022)   [employer] of Occupational Health - Occupational Stress Questionnaire    Feeling of Stress : Not at all  Social Connections: Socially Isolated (05/14/2023)   Social Connection and Isolation Panel    Frequency of Communication with Friends and Family: More than three times a week    Frequency of Social Gatherings with Friends and Family: Once a week    Attends Religious Services: Never    Database administrator or Organizations: No    Attends Banker Meetings: Never    Marital Status: Widowed  Intimate Partner Violence: Not At Risk (06/08/2023)   Humiliation, Afraid, Rape, and Kick questionnaire    Fear of Current or Ex-Partner: No    Emotionally Abused: No    Physically Abused: No    Sexually Abused: No    Past Surgical History:  Procedure Laterality Date   APPENDECTOMY     BIOPSY  10/21/2020   Procedure: BIOPSY;  Surgeon: Daina Drum, MD;  Location: [facility] ENDOSCOPY;  Service: Gastroenterology;;   BREAST BIOPSY     CARDIAC CATHETERIZATION N/A 05/21/2015   Procedure: Left  Heart Cath and Coronary Angiography;  Surgeon: Arleen Lacer, MD;  Location: [facility] INVASIVE CV LAB;  Service: Cardiovascular;: Ost RI 60%, Ost RCA 30%, dLAD tapers to small vessel w/ 40-50%. Mildly elevated LVEDP. Normal LV Fxn.   COLONOSCOPY N/A 05/20/2015   Procedure: COLONOSCOPY;  Surgeon: Danette Duos, MD;  Location: [facility] ENDOSCOPY;  Service: Gastroenterology;  Laterality: N/A;   CORONARY CA2+ SCORE / CARDIAC CT ANGIOGRAM  10/09/2019   Calcium  score 624.  82nd percentile. Dominant RCA: Mild (25-49%) proximal stenosis-distal bifurcation into PDA and PAV--<  RPL branches.  LAD (1 major mid vessel diagonal) diffuse calcified plaque, mild proximal stenosis with minimal distal stenosis.  Small RI moderate ostial disease.  LCx-moderate mixed (50-69%) proximal stenosis.  Small dOM1 disease.  Trileaflet AoV, annular Ca2+ - probable AS   ESOPHAGOGASTRODUODENOSCOPY N/A 05/20/2015   Procedure: ESOPHAGOGASTRODUODENOSCOPY (EGD);  Surgeon: Danette Duos, MD;  Location: [facility] ENDOSCOPY;  Service: Gastroenterology;  Laterality: N/A;   ESOPHAGOGASTRODUODENOSCOPY (EGD) WITH PROPOFOL  N/A 10/21/2020   Procedure: ESOPHAGOGASTRODUODENOSCOPY (EGD) WITH PROPOFOL ;  Surgeon: Daina Drum, MD;  Location: [facility] ENDOSCOPY;  Service: Gastroenterology;  Laterality: N/A;   INTRAOPERATIVE TRANSTHORACIC ECHOCARDIOGRAM N/A 10/26/2020   Procedure: INTRAOPERATIVE TRANSTHORACIC ECHOCARDIOGRAM;  Surgeon: Abel Hoe; Location: [facility] OR; Pre-TAVR well-visualized calcified AoV mean Grad 37 mmHg, AVA 0.72 cm => Post TAVR well-positioned supra-annular 26 mm Medtronic Evolut Pro valve placed with no PVL.  Mean gradient 14 mmHg.  AVR 1.58 cm.  Normal flow to RCA and LM-LAD.  ; EF 60 to 65%.  Degenerative severe MAC w/ mild MR.   RIGHT/LEFT HEART CATH AND CORONARY ANGIOGRAPHY N/A 09/29/2020   Procedure: RIGHT/LEFT HEART CATH AND CORONARY ANGIOGRAPHY;  Surgeon: Odie Benne, MD;  Location: [facility] INVASIVE CV LAB;  Service:  Cardiovascular; pre-TAVR:  pRCA 20%, m-dRCA 30%. D LM-pLAD 20%. RI 30%, mLAD 20%.  Severe AS with mean gradient measured at 52.8 mmHg.   TONSILLECTOMY     TRANSCATHETER AORTIC VALVE REPLACEMENT, TRANSFEMORAL N/A 10/26/2020   Procedure: TRANSCATHETER AORTIC VALVE REPLACEMENT, TRANSFEMORAL;  Surgeon: Odie Benne, MD;  Location: [facility] OR;  Service: Open Heart Surgery;; Medtronic Evolut-Pro + (size 26 mm, model # EVPROPLUS -26US, serial # [id number]); transient high-grade AV block noted so PPM left in place.   TRANSTHORACIC ECHOCARDIOGRAM  03/17/2019   a) 03/2019: EF 60 to 65%.  Moderate LVH.  GRII DD.  Mod-Severe AS (m grad 36 mmHg, peak 59 mmHg); b) 09/2019: EF 65-70%, No RWMA. Gr1 DD/hi LAP, Mild hi PAP. Mod LA Dil. MOD AS (mean Grad 34.5 mmHg).  STABLE   TRANSTHORACIC ECHOCARDIOGRAM  08/27/2020   Admitted for CVA/Afib RVR & CHF (Progression to SEVERE SYMPTOMATIC AS):  Severe Calcific Aortic Stenosis (VTI AVA estimated 0.86 cm, mean gradient 42 mmHg, V-max 4.36 m/s).  EF 55 to 60%.  Severe concentric LVH.  Unable to determine diastolic parameters.  Moderately elevated PAP.  Mild LA dilation.  Mild circumferential pericardial effusion.  Trivial MR.  Severe MAC.   TRANSTHORACIC ECHOCARDIOGRAM  11/25/2020   1 Month s/p TAVR: EF 55 to 60%.  No R WMA.  GR 1 DD.  Elevated LVEDP.  Normal RV size with mildly elevated RVP estimated 44 mmHg.  Oscillating density in the RV suspect calcified chordal apparatus versus calcified thrombus.  Moderate LA dilation.  Normal IVC/RA P => Well-positioned 26 mm Medtronic Evolut Pro THVwith mean AOV gradient 11 mmHg.  Trivial PVL   TRANSTHORACIC ECHOCARDIOGRAM  11/02/2020   a) Day 1 Post-op TAVR 10/27/20:  Well-Positioned Supra Annular Medtronic Evolut Pro THP.  Mean gradient 12 mmHg, peak 24 mmHg.  AVA 1.8 cm.  Trivial PVL.  EF 60 to 65%.  Normal LV function.  Mild LVH.  Severe LA dilation.  Mild RA dilation.  Severe MAC;; b) 11/02/20: Normal structure and function of the  aortic valve prosthesis.  Mean gradient 9 mmHg (otherwise stable)   TUBAL LIGATION      Family History  Problem Relation Age of Onset   Alcohol abuse Other    Arthritis Other  DJD   Hyperlipidemia Other    Heart disease Other    Stroke Other    Hypertension Other    Depression Other    Diabetes Other    Cancer Mother        ENT cancer   CAD Sister        Several MI & PPM; long term smoker, EtOH   Heart attack Sister 79       several   Arrhythmia Sister        s/p PPM (Dr. Alvis Ba)    Allergies  Allergen Reactions   Ibuprofen Other (See Comments)    Bleeding events   Amiodarone  Other (See Comments)    Lung toxicity    Current Outpatient Medications on File Prior to Visit  Medication Sig Dispense Refill   acetaminophen  (TYLENOL ) 500 MG tablet Take 500 mg by mouth every 6 (six) hours as needed for moderate pain.     ALPRAZolam  (XANAX ) 0.25 MG tablet Take 1 tablet (0.25 mg total) by mouth 3 (three) times daily as needed for anxiety. 90 tablet 2   atorvastatin  (LIPITOR ) 40 MG tablet TAKE 1 TABLET BY MOUTH EVERY DAY 90 tablet 3   [employer] VITAMIN B12 1000 MCG tablet TAKE 1 TABLET BY MOUTH EVERY DAY 90 tablet 3   hydrALAZINE  (APRESOLINE ) 100 MG tablet Take 1 tablet (100 mg total) by mouth 3 (three) times daily. 270 tablet 3   insulin  glargine (LANTUS ) 100 UNIT/ML Solostar Pen Inject 40 Units into the skin daily. 15 mL 5   isosorbide  mononitrate (IMDUR ) 60 MG 24 hr tablet Take 1 tablet (60 mg total) by mouth daily. 90 tablet 3   levothyroxine  (SYNTHROID ) 25 MCG tablet TAKE 1 TABLET BY MOUTH EVERY DAY 90 tablet 2   metoprolol  succinate (TOPROL -XL) 25 MG 24 hr tablet Take 0.5 tablets (12.5 mg total) by mouth daily. 45 tablet 3   mirtazapine  (REMERON ) 15 MG tablet TAKE 1 TABLET BY MOUTH EVERYDAY AT BEDTIME 90 tablet 3   pantoprazole  (PROTONIX ) 40 MG tablet TAKE 1 TABLET BY MOUTH TWICE A DAY 180 tablet 1   predniSONE  (DELTASONE ) 20 MG tablet Take 2 tablets (40 mg total) by mouth  daily with breakfast. (Patient taking differently: Take 30 mg by mouth daily with breakfast. Tapering down to discontinue this medication. Currently on the 1.5 tablets.) 60 tablet 1   solifenacin  (VESICARE ) 5 MG tablet Take 1 tablet (5 mg total) by mouth daily. 90 tablet 3   torsemide  (DEMADEX ) 20 MG tablet Take 2 tablets (40 mg total) by mouth daily. 60 tablet 1   No current facility-administered medications on file prior to visit.    BP (!) 140/58 (BP Location: Left Arm, Patient Position: Sitting, Cuff Size: Normal)   Pulse 61   Temp (!) 97.5 F (36.4 C) (Oral)   Ht 5' 1 (1.549 m)   Wt 177 lb 12.8 oz (80.6 kg)   SpO2 98%   BMI 33.60 kg/m chart     Objective:    BP (!) 140/58 (BP Location: Left Arm, Patient Position: Sitting, Cuff Size: Normal)   Pulse 61   Temp (!) 97.5 F (36.4 C) (Oral)   Ht 5' 1 (1.549 m)   Wt 177 lb 12.8 oz (80.6 kg)   SpO2 98%   BMI 33.60 kg/m    Physical Exam Vitals and nursing note reviewed.  Constitutional:      Appearance: Normal appearance. She is normal weight.   Cardiovascular:     Rate and Rhythm:  Normal rate and regular rhythm.     Pulses: Normal pulses.     Heart sounds: Normal heart sounds.  Pulmonary:     Effort: Pulmonary effort is normal.     Breath sounds: Normal breath sounds.   Musculoskeletal:        General: Normal range of motion.   Skin:    General: Skin is warm and dry.   Neurological:     General: No focal deficit present.     Mental Status: She is alert and oriented to person, place, and time. Mental status is at baseline.   Psychiatric:        Mood and Affect: Mood normal.        Behavior: Behavior normal.     No results found for any visits on 07/19/23.      Assessment & Plan:   Problem List Items Addressed This Visit     Type 2 diabetes mellitus with hyperglycemia, without long-term current use of insulin  (HCC) (Chronic)   Relevant Orders   Hemoglobin A1c   CMP   Essential hypertension  (Chronic)   Relevant Orders   Hemoglobin A1c   CMP   CKD (chronic kidney disease), stage III (HCC) (Chronic)   Relevant Orders   Hemoglobin A1c   CMP   Other Visit Diagnoses       Hospital discharge follow-up    -  Primary   Relevant Orders   Hemoglobin A1c   CMP       Meds ordered this encounter  Medications   Blood Glucose Monitoring Suppl DEVI    Sig: 1 each by Does not apply route in the morning, at noon, and at bedtime. May substitute to any manufacturer covered by patient's insurance.    Dispense:  1 each    Refill:  0   Glucose Blood (BLOOD GLUCOSE TEST STRIPS) STRP    Sig: 1 each by In Vitro route in the morning, at noon, and at bedtime. May substitute to any manufacturer covered by patient's insurance.    Dispense:  100 strip    Refill:  0   Lancet Device MISC    Sig: 1 each by Does not apply route in the morning, at noon, and at bedtime. May substitute to any manufacturer covered by patient's insurance.    Dispense:  1 each    Refill:  0   Lancets Misc. MISC    Sig: 1 each by Does not apply route in the morning, at noon, and at bedtime. May substitute to any manufacturer covered by patient's insurance.    Dispense:  100 each    Refill:  0   Continue current medications.  Monitor blood glucose levels closely as she is titrating down off of prednisone .  Follow-up with PCP as scheduled in 2 weeks.  Call with any questions or concerns prior to that appointment. No follow-ups on file.  Evvie Behrmann B Amaia Lavallie, [name]

## 2023-07-23 DIAGNOSIS — K219 Gastro-esophageal reflux disease without esophagitis: Secondary | ICD-10-CM | POA: Diagnosis not present

## 2023-07-23 DIAGNOSIS — E1122 Type 2 diabetes mellitus with diabetic chronic kidney disease: Secondary | ICD-10-CM | POA: Diagnosis not present

## 2023-07-23 DIAGNOSIS — E1169 Type 2 diabetes mellitus with other specified complication: Secondary | ICD-10-CM | POA: Diagnosis not present

## 2023-07-23 DIAGNOSIS — I272 Pulmonary hypertension, unspecified: Secondary | ICD-10-CM | POA: Diagnosis not present

## 2023-07-23 DIAGNOSIS — I700 Atherosclerosis of aorta: Secondary | ICD-10-CM | POA: Diagnosis not present

## 2023-07-23 DIAGNOSIS — E7849 Other hyperlipidemia: Secondary | ICD-10-CM | POA: Diagnosis not present

## 2023-07-23 DIAGNOSIS — E1165 Type 2 diabetes mellitus with hyperglycemia: Secondary | ICD-10-CM | POA: Diagnosis not present

## 2023-07-23 DIAGNOSIS — D509 Iron deficiency anemia, unspecified: Secondary | ICD-10-CM | POA: Diagnosis not present

## 2023-07-23 DIAGNOSIS — E538 Deficiency of other specified B group vitamins: Secondary | ICD-10-CM | POA: Diagnosis not present

## 2023-07-23 DIAGNOSIS — N179 Acute kidney failure, unspecified: Secondary | ICD-10-CM | POA: Diagnosis not present

## 2023-07-23 DIAGNOSIS — J690 Pneumonitis due to inhalation of food and vomit: Secondary | ICD-10-CM | POA: Diagnosis not present

## 2023-07-23 DIAGNOSIS — I160 Hypertensive urgency: Secondary | ICD-10-CM | POA: Diagnosis not present

## 2023-07-23 DIAGNOSIS — M810 Age-related osteoporosis without current pathological fracture: Secondary | ICD-10-CM | POA: Diagnosis not present

## 2023-07-23 DIAGNOSIS — N184 Chronic kidney disease, stage 4 (severe): Secondary | ICD-10-CM | POA: Diagnosis not present

## 2023-07-23 DIAGNOSIS — I2510 Atherosclerotic heart disease of native coronary artery without angina pectoris: Secondary | ICD-10-CM | POA: Diagnosis not present

## 2023-07-23 DIAGNOSIS — I5033 Acute on chronic diastolic (congestive) heart failure: Secondary | ICD-10-CM | POA: Diagnosis not present

## 2023-07-23 DIAGNOSIS — E039 Hypothyroidism, unspecified: Secondary | ICD-10-CM | POA: Diagnosis not present

## 2023-07-23 DIAGNOSIS — J9601 Acute respiratory failure with hypoxia: Secondary | ICD-10-CM | POA: Diagnosis not present

## 2023-07-23 DIAGNOSIS — I4819 Other persistent atrial fibrillation: Secondary | ICD-10-CM | POA: Diagnosis not present

## 2023-07-23 DIAGNOSIS — I130 Hypertensive heart and chronic kidney disease with heart failure and stage 1 through stage 4 chronic kidney disease, or unspecified chronic kidney disease: Secondary | ICD-10-CM | POA: Diagnosis not present

## 2023-07-26 ENCOUNTER — Other Ambulatory Visit: Payer: Self-pay

## 2023-07-26 NOTE — Patient Instructions (Addendum)
 Visit Information  Thank you for taking time to visit with me today. Please don't hesitate to contact me if I can be of assistance to you before our next scheduled appointment.  Our next appointment is by telephone on 08/09/23 at 10:00 am Please call the care guide team at [phone] if you need to cancel or reschedule your appointment.   Following is a copy of your care plan:   Goals Addressed             This Visit's Progress    VBCI RN Care Plan       Problems:  Chronic Disease Management support and education needs related to CHF and HTN  Goal: Over the next 90 days the Patient will attend all scheduled medical appointments: with cardiology and primary care provider as evidenced by patient report and/or review of chart        continue to work with RN Care Manager and/or Social Worker to address care management and care coordination needs related to CHF and HTN as evidenced by adherence to care management team scheduled appointments     demonstrate Improved adherence to prescribed treatment plan for CHF and HTN as evidenced by patient report and/or review of chart take all medications exactly as prescribed and will call provider for medication related questions as evidenced by patient report and/or review of chart     Interventions:   Heart Failure Interventions: Assessed need for readable accurate scales in home Discussed importance of daily weight and advised patient to weigh and record daily Discussed the importance of keeping all appointments with provider Advised patient to continue to monitor Blood pressure and pulse and notify provider if outside of recommended ranges  Patient Self-Care Activities:  Attend all scheduled provider appointments Call provider office for new concerns or questions  call office if I gain more than 2 pounds in one day or 5 pounds in one week watch for swelling in feet, ankles and legs every day weigh myself daily take medications for blood  pressure exactly as prescribed  Plan:  Telephone follow up appointment with care management team member scheduled for:  08/09/23 at 10:00 am      Please call the Suicide and Crisis Lifeline: 988 call the [place]  National Suicide Prevention Lifeline: (754)082-3216 or TTY: (605) 152-1149 TTY (650) 008-1738) to talk to a trained counselor if you are experiencing a Mental Health or Behavioral Health Crisis or need someone to talk to.  Patient verbalizes understanding of instructions and care plan provided today and agrees to view in MyChart. Active MyChart status and patient understanding of how to access instructions and care plan via MyChart confirmed with patient.     Lindi Revering, RN, MSN, BSN, CCM [facility]  [facility], Population Health Case Manager Phone: [phone]

## 2023-07-26 NOTE — Patient Outreach (Signed)
 Complex Care Management   Visit Note  07/26/2023  Name:  Mary Mccarthy MRN: [id number] DOB: [birth year]  Situation: Referral received for Complex Care Management related to Heart Failure and HTN I obtained verbal consent from Patient.  Visit completed with patient  on the phone  Background:   Past Medical History:  Diagnosis Date   ALLERGIC RHINITIS 06/24/2007   Anemia    AVM (arteriovenous malformation) of colon    Blood transfusion without reported diagnosis 05/2015   COLONIC POLYPS, HX OF 06/24/2007   Coronary artery disease, non-occlusive 05/2015   Trop + w/ Acute Anemia =>CATH: small RI - Ostial 60%, ostial RCA 30% and dLAD 40-50%;; 9/'21: Cor Ca Score 624. Mild (25-49%) prox RCA & LAD,; Moderate (50-69%) Ostial Small RI & prox LCx.     DEPRESSION 10/08/2006   DIABETES MELLITUS, TYPE II 10/05/2006   GERD 06/24/2007   History of CVA (cerebrovascular accident) 08/10/2020   08/2020- found to be in afib with RVR, started on Eliquis  (shower of emboli from A. fib as complication of severe AS)   HYPERLIPIDEMIA 10/08/2006   HYPERTENSION 10/08/2006   INSOMNIA 09/24/2008   Left knee DJD    Nonsustained paroxysmal ventricular tachycardia (HCC) 10/2018   Post-TAVR Zio Patch: Frequent (206) runs of NSVT - Fastest 5 beats 239 bpm. Longest 9.3 Sec avg 135 bpm. (? possibly Afib w/ aberrancy)   Osteoporosis 03/10/2016   PAF (paroxysmal atrial fibrillation) (HCC)    PAF with RVR: CHA2DS2-VASc 9.  On Eliquis  and amiodarone . 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 Abel Hoe & Dr. Sherene Dilling   Severe aortic stenosis 08/2020   Progression from Mod-Severe AS to Severe AS - noted on Echo 08/2020 -> (mean gradient progressed from 34 to 42 mmHg) this was in setting of new onset A. fib RVR, acute diastolic HF and shower of emboli CVA. ->  Referred for TAVR,  completed 10/26/2020    Assessment: Patient reports, I am feeling pretty good. She denies any difficulty breathing at this time. She denies any leg swelling. BP elevated today-see below. DM-patient checks BS twice a day and reports has been elevated-weaning dosing of Prednisone . Patient with AWV scheduled 08/01/23.  Patient Reported Symptoms: Cognitive Cognitive Status: Alert and oriented to person, place, and time, Normal speech and language skills, Insightful and able to interpret abstract concepts      Neurological Neurological Review of Symptoms: No symptoms reported    HEENT HEENT Symptoms Reported: No symptoms reported      Cardiovascular Cardiovascular Symptoms Reported: No symptoms reported Does patient have uncontrolled Hypertension?: Yes Is patient checking Blood Pressure at home?: Yes Patient's Recent BP reading at home: Patient reports she checks her BP daily. BP this morning 173/61. RNCM reinforced this reading is high. She reports BP was 157/54 (yesterday 07/25/23) and 07/24/23 BP 155/49. Patient reports she on occasion missed the three times a day medicine. And reports son helps her to manage her medications to ensure that she takes medications as prescribed. Patient has an upcoming visit with cardiology on 07/31/23. Cardiovascular Conditions: Hypertension, Heart failure Do You Have a Working Readable Scale?: Yes Weight:  (per patient: weight 07/25/23 177.2 and weight on 07/24/23 177.2.)  Respiratory Respiratory Symptoms Reported: Productive cough Other Respiratory Symptoms: reports breathing good. feels like she has phlegm in her throat and can clear her throat. Additional  Respiratory Details: O2 at 3l/[place] patient reports O2 sat 98% on oxygen     Endocrine Patient reports the following symptoms related to hypoglycemia or hyperglycemia : No symptoms reported Is patient diabetic?: Yes Is patient checking blood sugars at home?: Yes Endocrine Conditions: Diabetes   Gastrointestinal Gastrointestinal Symptoms Reported: No symptoms reported      Genitourinary Genitourinary Symptoms Reported: No symptoms reported    Integumentary Integumentary Symptoms Reported: No symptoms reported    Musculoskeletal Musculoskelatal Symptoms Reviewed: Unsteady gait, Difficulty walking Additional Musculoskeletal Details: uses walker when ambulating Musculoskeletal Conditions: Mobility limited      Psychosocial Psychosocial Symptoms Reported: Depression - if selected complete PHQ 2-9     Quality of Family Relationships: supportive      07/26/2023    3:52 PM  Depression screen PHQ 2/9  Decreased Interest 0  Down, Depressed, Hopeless 0  PHQ - 2 Score 0    Vitals:   07/26/23 1527  BP: (!) 173/61  Pulse: 69  SpO2: 98%    Medications Reviewed Today     Reviewed by Amillya Chavira M, RN (Registered Nurse) on 07/26/23 at 1538  Med List Status: <None>   Medication Order Taking? Sig Documenting Provider Last Dose Status Informant  acetaminophen  (TYLENOL ) 500 MG tablet [id number] Yes Take 500 mg by mouth every 6 (six) hours as needed for moderate pain. [provider]  Active Self, Child, Pharmacy Records           Med Note Mliss Anderson [place], [name]   Mon May 14, 2023  2:52 PM)    ALPRAZolam  (XANAX ) 0.25 MG tablet [id number] Yes Take 1 tablet (0.25 mg total) by mouth 3 (three) times daily as needed for anxiety. Roslyn Coombe, MD  Active Self, Child, Pharmacy Records  atorvastatin  (LIPITOR ) 40 MG tablet [id number] Yes TAKE 1 TABLET BY MOUTH EVERY DAY Arleen Lacer, MD  Active Self, Child, Pharmacy Records  Blood Glucose Monitoring Suppl DEVI [id number]  1 each by Does not apply route in the morning, at noon, and at bedtime. May substitute to any manufacturer covered by patient's insurance. Jarold Merlin B, [name]  Active   [employer] VITAMIN B12 1000 MCG tablet [id number] Yes TAKE 1 TABLET BY MOUTH EVERY DAY Roslyn Coombe, MD  Active Self, Child, Pharmacy Records   Glucose Blood (BLOOD GLUCOSE TEST STRIPS) STRP [id number]  1 each by In Vitro route in the morning, at noon, and at bedtime. May substitute to any manufacturer covered by patient's insurance. Webb, Padonda B, [name]  Active   hydrALAZINE  (APRESOLINE ) 100 MG tablet [id number] Yes Take 1 tablet (100 mg total) by mouth 3 (three) times daily. Arleen Lacer, MD  Active   insulin  glargine (LANTUS ) 100 UNIT/ML Solostar Pen [id number] Yes Inject 40 Units into the skin daily. Roslyn Coombe, MD  Active   isosorbide  mononitrate (IMDUR ) 60 MG 24 hr tablet [id number] Yes Take 1 tablet (60 mg total) by mouth daily. Arleen Lacer, MD  Active   Lancet Device MISC [id number]  1 each by Does not apply route in the morning, at noon, and at bedtime. May substitute to any manufacturer covered by patient's insurance. Rondall Codding, [name]  Active   Lancets Misc. MISC [id number]  1 each by Does not apply route in the morning, at noon, and at bedtime. May substitute to any manufacturer covered by patient's insurance. Webb, Padonda B, [name]  Active   levothyroxine  (SYNTHROID ) 25 MCG tablet [id number] Yes TAKE 1 TABLET BY MOUTH EVERY  DAY Roslyn Coombe, MD  Active Self, Child, Pharmacy Records  metoprolol  succinate (TOPROL -XL) 25 MG 24 hr tablet [id number] Yes Take 0.5 tablets (12.5 mg total) by mouth daily. Roslyn Coombe, MD  Active   mirtazapine  (REMERON ) 15 MG tablet [id number] Yes TAKE 1 TABLET BY MOUTH EVERYDAY AT BEDTIME Roslyn Coombe, MD  Active Self, Child, Pharmacy Records           Med Note [facility] [place], [name]   Mon May 14, 2023  3:12 PM) Pt adamant of taking medication. Dispense report does not support claim.  pantoprazole  (PROTONIX ) 40 MG tablet [id number] Yes TAKE 1 TABLET BY MOUTH TWICE A DAY Ardia Kraft, PA-C  Active Self, Child, Pharmacy Records  predniSONE  (DELTASONE ) 20 MG tablet [id number] Yes Take 2 tablets (40 mg total) by mouth daily with breakfast.  Patient taking differently: Take 30 mg by mouth  daily with breakfast. Tapering down to discontinue this medication. Patient reports tapering medication as prescribed-son is managing this.   Deforest Fast, MD  Active   solifenacin  (VESICARE ) 5 MG tablet [id number] Yes Take 1 tablet (5 mg total) by mouth daily. Roslyn Coombe, MD  Active Self, Child, Pharmacy Records  torsemide  (DEMADEX ) 20 MG tablet [id number] Yes Take 2 tablets (40 mg total) by mouth daily. Deforest Fast, MD  Active           Recommendation:   Specialty provider follow-up cardiology visit scheduled for 07/31/23 AWV(phone) scheduled for 08/01/23  Follow Up Plan:   Telephone follow up appointment date/time:  08/09/23 at 10:00 am  Lindi Revering, RN, MSN, BSN, CCM [place]  [facility], Population Health Case Manager Phone: [phone]

## 2023-07-27 ENCOUNTER — Telehealth: Payer: Self-pay

## 2023-07-27 NOTE — Patient Instructions (Signed)
 Visit Information  Thank you for taking time to visit with me today. Please don't hesitate to contact me if I can be of assistance to you before our next scheduled appointment.  Your next care management appointment is by telephone on 08/09/23 at 10:00 am   Please call the care guide team at [phone] if you need to cancel, schedule, or reschedule an appointment.   Please call the Suicide and Crisis Lifeline: 988 call the [place]  National Suicide Prevention Lifeline: 337-177-8774 or TTY: (234)767-3340 TTY (509) 015-7321) to talk to a trained counselor call 1-800-273-TALK (toll free, 24 hour hotline) go to [facility] [address] [place] [id number]) if you are experiencing a Mental Health or Behavioral Health Crisis or need someone to talk to.  Lindi Revering, RN, MSN, BSN, CCM [place]  [facility], Population Health Case Manager Phone: [phone]

## 2023-07-27 NOTE — Patient Outreach (Signed)
 Complex Care Management   Visit Note  07/27/2023  Name:  Mary Mccarthy MRN: [id number] DOB: 05-19-1940  Situation: Referral received for Complex Care Management related to Heart Failure and HTN I obtained verbal consent from Patient.  Visit completed with patient  on the phone  Assessment: RNCM called to follow up on BP reading for today. Per Patient BP 149/50 pulse 94 this morning. She is without any questions or concerns at this time.   Vitals:   07/27/23 1146  BP: (!) 149/50  Pulse: 94    Recommendation:   Continue Current Plan of Care  Follow Up Plan:   As previously scheduled  Lindi Revering, RN, MSN, BSN, CCM [place]  [facility], Population Health Case Manager Phone: [phone]

## 2023-07-30 DIAGNOSIS — I5033 Acute on chronic diastolic (congestive) heart failure: Secondary | ICD-10-CM | POA: Diagnosis not present

## 2023-07-30 DIAGNOSIS — I130 Hypertensive heart and chronic kidney disease with heart failure and stage 1 through stage 4 chronic kidney disease, or unspecified chronic kidney disease: Secondary | ICD-10-CM | POA: Diagnosis not present

## 2023-07-30 DIAGNOSIS — E1165 Type 2 diabetes mellitus with hyperglycemia: Secondary | ICD-10-CM | POA: Diagnosis not present

## 2023-07-30 DIAGNOSIS — I272 Pulmonary hypertension, unspecified: Secondary | ICD-10-CM | POA: Diagnosis not present

## 2023-07-30 DIAGNOSIS — K219 Gastro-esophageal reflux disease without esophagitis: Secondary | ICD-10-CM | POA: Diagnosis not present

## 2023-07-30 DIAGNOSIS — E1169 Type 2 diabetes mellitus with other specified complication: Secondary | ICD-10-CM | POA: Diagnosis not present

## 2023-07-30 DIAGNOSIS — N179 Acute kidney failure, unspecified: Secondary | ICD-10-CM | POA: Diagnosis not present

## 2023-07-30 DIAGNOSIS — E1122 Type 2 diabetes mellitus with diabetic chronic kidney disease: Secondary | ICD-10-CM | POA: Diagnosis not present

## 2023-07-30 DIAGNOSIS — E7849 Other hyperlipidemia: Secondary | ICD-10-CM | POA: Diagnosis not present

## 2023-07-30 DIAGNOSIS — E538 Deficiency of other specified B group vitamins: Secondary | ICD-10-CM | POA: Diagnosis not present

## 2023-07-30 DIAGNOSIS — I4819 Other persistent atrial fibrillation: Secondary | ICD-10-CM | POA: Diagnosis not present

## 2023-07-30 DIAGNOSIS — E039 Hypothyroidism, unspecified: Secondary | ICD-10-CM | POA: Diagnosis not present

## 2023-07-30 DIAGNOSIS — J9601 Acute respiratory failure with hypoxia: Secondary | ICD-10-CM | POA: Diagnosis not present

## 2023-07-30 DIAGNOSIS — I700 Atherosclerosis of aorta: Secondary | ICD-10-CM | POA: Diagnosis not present

## 2023-07-30 DIAGNOSIS — I2510 Atherosclerotic heart disease of native coronary artery without angina pectoris: Secondary | ICD-10-CM | POA: Diagnosis not present

## 2023-07-30 DIAGNOSIS — I160 Hypertensive urgency: Secondary | ICD-10-CM | POA: Diagnosis not present

## 2023-07-30 DIAGNOSIS — J690 Pneumonitis due to inhalation of food and vomit: Secondary | ICD-10-CM | POA: Diagnosis not present

## 2023-07-30 DIAGNOSIS — N184 Chronic kidney disease, stage 4 (severe): Secondary | ICD-10-CM | POA: Diagnosis not present

## 2023-07-30 DIAGNOSIS — M810 Age-related osteoporosis without current pathological fracture: Secondary | ICD-10-CM | POA: Diagnosis not present

## 2023-07-30 DIAGNOSIS — D509 Iron deficiency anemia, unspecified: Secondary | ICD-10-CM | POA: Diagnosis not present

## 2023-07-31 ENCOUNTER — Ambulatory Visit: Attending: Physician Assistant | Admitting: Physician Assistant

## 2023-07-31 ENCOUNTER — Encounter: Payer: Self-pay | Admitting: Physician Assistant

## 2023-07-31 VITALS — BP 134/64 | HR 68 | Ht 61.0 in | Wt 181.6 lb

## 2023-07-31 DIAGNOSIS — I10 Essential (primary) hypertension: Secondary | ICD-10-CM | POA: Diagnosis not present

## 2023-07-31 DIAGNOSIS — Z794 Long term (current) use of insulin: Secondary | ICD-10-CM

## 2023-07-31 DIAGNOSIS — I5032 Chronic diastolic (congestive) heart failure: Secondary | ICD-10-CM | POA: Diagnosis not present

## 2023-07-31 DIAGNOSIS — N184 Chronic kidney disease, stage 4 (severe): Secondary | ICD-10-CM

## 2023-07-31 DIAGNOSIS — I480 Paroxysmal atrial fibrillation: Secondary | ICD-10-CM

## 2023-07-31 DIAGNOSIS — E785 Hyperlipidemia, unspecified: Secondary | ICD-10-CM

## 2023-07-31 DIAGNOSIS — I2510 Atherosclerotic heart disease of native coronary artery without angina pectoris: Secondary | ICD-10-CM

## 2023-07-31 DIAGNOSIS — E119 Type 2 diabetes mellitus without complications: Secondary | ICD-10-CM

## 2023-07-31 NOTE — Progress Notes (Signed)
 Cardiology Office Note   Date:  07/31/2023  ID:  Leialoha, Hanna 09/16/1940, MRN [id number] PCP: [name], MD  [facility] Providers Cardiologist:  Alm Clay, MD Electrophysiologist:  Jerel Balding, MD     History of Present Illness Chrystal Zeimet is a 83 y.o. female with PMH of PAF, HTN, HLD, DM2, nonobstructive CAD, PUD/AVM with recurrent GI bleed, iron  deficiency anemia, CVA, chronic diastolic heart failure and history of TAVR 10/27/2020.  She is not on anticoagulation therapy despite history of TIA, CVA and PAF due to recurrent GI bleed. Patient was admitted in 2017 with urosepsis, AKI and acute anemia with hemoglobin of 5 requiring blood transfusion.  EGD and colonoscopy showed several areas of potential GI bleed but no current bleeding.  She was found to have AVM and peptic ulcer disease.  She was admitted in July 2022 with a stroke and diagnosed with new atrial fibrillation at the time.  MRI of the brain showed small acute infarct diffusely bilaterally.  She was started on Eliquis .  Echocardiogram at the time showed EF 55 to 60%, moderate elevated pulmonary hypertension, severe concentric LVH, severe mitral annular calcification and severe aortic stenosis with mean gradient 42 mmHg.  She was readmitted in August 2022 with acute on chronic diastolic heart failure in the setting of severe aortic stenosis.  She underwent IV diuresis.  Hospital course complicated by A-fib with RVR however she converted to sinus rhythm on IV amiodarone .  She was seen by structural heart clinic and underwent left and right heart cath which showed nonobstructive CAD with severe aortic stenosis.  She underwent pre-TAVR workup as outpatient, however readmitted in September 2022 with symptomatic bradycardia and the GI bleed.  Hemoglobin was 7.2 on arrival and the patient required blood transfusion.  EGD showed no sign of active bleeding but stigmata of recent bleeding.  It was  suspected the patient's anemia and melena was due to bleeding from the gastric erosions in the setting of Eliquis  use.  Colonoscopy deferred due to severe aortic stenosis and the risk of sedation.    She underwent successful TAVR with 26 mm Medtronic Evolut Pro+ THV via TF approach in September 2022.  She developed complete heart block intraoperatively.  Postprocedure echocardiogram showed EF 60%, normal functioning TAVR valve with mean gradient of 12 mmHg, trivial perivalvular leakage.  She was discharged on Eliquis  without concurrent anticoagulation therapy given GI bleed.  Heart monitor after discharge did not show any heart block or bradycardia.  She was readmitted near the end of September 2022 with nausea, vomiting, diarrhea, and AKI.  She had lactic acidosis improved with IV fluid.  Hemoglobin 8-9.  Repeat echocardiogram on 11/02/2020 showed EF 60 to 65%, 26 mm sapient prosthetic TAVR valve present in the aortic position, TAVR valve had normal structure and function.  Telemetry showed intermittent A-fib with a ventricular rate up to 130s and 9 bpm of nonsustained VT.  She was started on low-dose Toprol -XL.  Later she had recurrent anemia with hemoglobin dropped down to 7.6, Eliquis  stopped.  She was referred to Dr. Wonda for consideration of Watchman procedure however it was decided to start her back on lower dose of Eliquis  at 2.5 mg twice a day.  She was ultimately felt not to be a Watchman candidate and anticoagulation therapy was discontinued due to risk of bleeding. Repeat echocardiogram obtained on 11/13/2022 showed EF 60 to 65%, no regional wall motion abnormality, grade 2 DD, severe LAE, moderately elevated PASP, 26  mm Medtronic TAVR valve with trivial AI and paravalvular leak.    She was in her usual state of health until January 2025 when she was readmitted to the hospital with shortness of breath.  She started having shortness of breath around Christmas and URI symptoms.  On arrival, she had a  look bilateral lower extremity edema.  Patient was admitted to hospitalist service, cardiology service was not consulted.  EKG at the time maintaining sinus rhythm.  She was diuresed to 4 L.  She was discharged on 60 mg daily of Lasix , I increased from the previous 40 mg daily.  I saw the patient in February, her weight was stable after discharge.  Unfortunately, she was readmitted in April 2025 for acute hypoxic respiratory failure.  She was treated for both heart failure and received antibiotic for pneumonia.  Imaging was performed to evaluate interstitial lung disease and she was taken off of amiodarone  and started on steroid.  Upon discharge, Lasix  was switched to torsemide  40 mg daily.  Due to lower extremity edema, amlodipine  was discontinued in May.  She was last seen by Dr. Anner on 07/10/2023, her weight was up to 184 pounds, target weight was 178 pounds.  She was given extra 40 mg of torsemide  every other day for 1 week before going back to 40 mg daily thereafter.  Hydralazine  was increased to 100 mg 3 times a day.  Since the last visit, patient was seen in the ED on 07/17/2023 for hyperglycemia, creatinine at the time was 2.06, repeat basic metabolic panel obtained on 07/19/2023 showed creatinine improving to 1.77.  Patient presents today accompanied by her son.  Based on home weight diary, her weight came down to 177 pounds between 6/12 - 6/19, however has since trended up to 181 pounds.  I asked her to take extra 40 mg of torsemide  for the next 2 days before going back to 40 mg daily thereafter.  In the future, she has been instructed to take extra  of 40 mg of torsemide  for 2 days every time her weight goes up to 181 pounds or higher.  She will need a repeat basic metabolic panel in August prior to her follow-up in September.  She has trace amount of lower extremity edema on exam today.  She denies any chest pain or significant shortness of breath.  She is going to come off of prednisone  in the next  few days, I asked her to contact her PCP if blood glucose level dropped below 100 in the morning.  Her Lantus  was recently increased to 43 units since her blood sugar has been elevated on the prednisone  therapy.  She need to observe her blood glucose level to make sure she does not become hypoglycemic after she come off of prednisone .   ROS:   She has trace amount of lower extremity edema.  She denies any chest pain or significant shortness of breath.  Studies Reviewed      Cardiac Studies & Procedures   ______________________________________________________________________________________________ CARDIAC CATHETERIZATION  CARDIAC CATHETERIZATION 09/29/2020  Conclusion   Prox RCA lesion is 20% stenosed.   Mid RCA to Dist RCA lesion is 20% stenosed.   Ramus lesion is 30% stenosed.   Mid LAD lesion is 20% stenosed.   Dist LM to Prox LAD lesion is 20% stenosed.  Mild non-obstructive CAD Severe aortic stenosis (mean gradient 52.8 mmHg, peak to peak gradient 58 mmHg, AVA 0.90 cm2).  Recommendations: Will continue workup for TAVR  Findings Coronary Findings  Diagnostic  Dominance: Right  Left Main Dist LM to Prox LAD lesion is 20% stenosed.  Left Anterior Descending Vessel is large. Mid LAD lesion is 20% stenosed.  Ramus Intermedius Ramus lesion is 30% stenosed.  Right Coronary Artery Vessel is large. Prox RCA lesion is 20% stenosed. Mid RCA to Dist RCA lesion is 20% stenosed.  Intervention  No interventions have been documented.   CARDIAC CATHETERIZATION  CARDIAC CATHETERIZATION 05/21/2015  Conclusion Images from the original result were not included. 1. Ost Ramus lesion, 60% stenosed. 2. Ost RCA lesion, 30% stenosed. 3. Dist LAD lesion, tapers to a small vessel with diffuse roughly 40-50% stenosis. 4. The left ventricular systolic function is normal. 5. Elevated LVEDP  No culprit lesion noted to explain the patient's elevated troponin. The most significant  lesion in the ostial ramus intermedius. This is not PCI target  She has elevated LVEDP, but relatively preserved EF.  Plan:  Return to TCU for monitoring and continued care.  TR band removal per protocol.  Defer further cardiac evaluation and management to Dr. Mona.    [name], M.D., M.S. Interventional Cardiologist  Pager # [phone] Phone # [phone] [address] [place], [place] [address]  Findings Coronary Findings Diagnostic  Dominance: Right  Left Main . Vessel is large.  Left Anterior Descending . Vessel is large. Tapers to relatively small caliber vessel distally with diffuse apical disease Diffuse. Vessel is a relatively small at this level with diffuse disease  First Diagonal Branch The vessel is moderate in size and is angiographically normal.  Lateral First Diagonal Branch The vessel is small in size.  First Septal Branch The vessel is small in size.  Second Septal Branch The vessel is small in size.  Third Septal Branch The vessel is small in size.  Ramus Intermedius . Vessel is moderate in size. Discrete located at the major branch.  Lateral Ramus Intermedius The vessel is small in size.  Left Circumflex . Vessel is angiographically normal.  First Obtuse Marginal Branch The vessel is angiographically normal.  Lateral First Obtuse Marginal Branch The vessel is small in size.  Right Coronary Artery . Vessel is large. Discrete.  Acute Marginal Branch The vessel is moderate in size.  First Right Posterolateral Branch The vessel is small in size.  Third Right Posterolateral Branch The vessel is small in size.  Intervention  No interventions have been documented.     ECHOCARDIOGRAM  ECHOCARDIOGRAM COMPLETE 05/15/2023  Narrative ECHOCARDIOGRAM REPORT    Patient Name:   MCKYNZIE LIWANAG Date of Exam: 05/15/2023 Medical Rec #:  [id number]                Height:       61.0 in Accession #:     [id number]               Weight:       194.9 lb Date of Birth:  1941/01/23                BSA:          1.868 m Patient Age:    82 years                 BP:           155/126 mmHg Patient Gender: F                        HR:  61 bpm. Exam Location:  Inpatient  Procedure: 2D Echo, Cardiac Doppler and Color Doppler (Both Spectral and Color Flow Doppler were utilized during procedure).  Indications:    I50.40* Unspecified combined systolic (congestive) and diastolic (congestive) heart failure  History:        Patient has prior history of Echocardiogram examinations, most recent 11/13/2022. CHF, CAD, Stroke, Aortic Valve Disease; Risk Factors:Hypertension, Dyslipidemia and Diabetes. TAVR procedure. Aortic Valve: 26 mm Medtronic stented (TAVR) valve is present in the aortic position. Procedure Date: 10/26/2020.  Sonographer:    Ellouise Mose RDCS Referring Phys: [id number] RONDELL A SMITH   Sonographer Comments: Study delayed, patient in chair IMPRESSIONS   1. Left ventricular ejection fraction, by estimation, is 60 to 65%. The left ventricle has normal function. The left ventricle has no regional wall motion abnormalities. There is severe asymmetric left ventricular hypertrophy of the septal segment. Left ventricular diastolic function could not be evaluated. 2. Right ventricular systolic function is normal. The right ventricular size is mildly enlarged. There is moderately elevated pulmonary artery systolic pressure. The estimated right ventricular systolic pressure is 53.7 mmHg. 3. Left atrial size was mildly dilated. 4. Right atrial size was mildly dilated. 5. The mitral valve is degenerative. Trivial mitral valve regurgitation. Mild mitral stenosis. The mean mitral valve gradient is 3.0 mmHg. Severe mitral annular calcification. 6. Tricuspid valve regurgitation is mild to moderate. 7. Mild paravalvular leak adjacent to the intervalvular fibrosa. The aortic valve has been  repaired/replaced. Aortic valve regurgitation is mild. No aortic stenosis is present. There is a 26 mm Medtronic stented (TAVR) valve present in the aortic position. Procedure Date: 10/26/2020. Echo findings are consistent with normal structure and function of the aortic valve prosthesis. Echo findings are consistent with perivalvular leak of the aortic prosthesis. Aortic regurgitation PHT measures 514 msec. Aortic valve mean gradient measures 9.0 mmHg. Aortic valve Vmax measures 1.88 m/s. 8. The inferior vena cava is dilated in size with <50% respiratory variability, suggesting right atrial pressure of 15 mmHg.  Comparison(s): Prior images reviewed side by side. PVL is more prominent, stable RVSP.  FINDINGS Left Ventricle: Left ventricular ejection fraction, by estimation, is 60 to 65%. The left ventricle has normal function. The left ventricle has no regional wall motion abnormalities. Strain was performed and the global longitudinal strain is indeterminate. The left ventricular internal cavity size was normal in size. There is severe asymmetric left ventricular hypertrophy of the septal segment. Left ventricular diastolic function could not be evaluated due to mitral annular calcification (moderate or greater). Left ventricular diastolic function could not be evaluated.  Right Ventricle: The right ventricular size is mildly enlarged. No increase in right ventricular wall thickness. Right ventricular systolic function is normal. There is moderately elevated pulmonary artery systolic pressure. The tricuspid regurgitant velocity is 3.11 m/s, and with an assumed right atrial pressure of 15 mmHg, the estimated right ventricular systolic pressure is 53.7 mmHg.  Left Atrium: Left atrial size was mildly dilated.  Right Atrium: Right atrial size was mildly dilated.  Pericardium: There is no evidence of pericardial effusion.  Mitral Valve: The mitral valve is degenerative in appearance. Severe mitral  annular calcification. Trivial mitral valve regurgitation. Mild mitral valve stenosis. MV peak gradient, 11.8 mmHg. The mean mitral valve gradient is 3.0 mmHg with average heart rate of 59 bpm.  Tricuspid Valve: The tricuspid valve is normal in structure. Tricuspid valve regurgitation is mild to moderate. No evidence of tricuspid stenosis.  Aortic Valve: Mild paravalvular leak adjacent to  the intervalvular fibrosa. The aortic valve has been repaired/replaced. Aortic valve regurgitation is mild. Aortic regurgitation PHT measures 514 msec. No aortic stenosis is present. Aortic valve mean gradient measures 9.0 mmHg. Aortic valve peak gradient measures 14.1 mmHg. Aortic valve area, by VTI measures 1.92 cm. There is a 26 mm Medtronic stented (TAVR) valve present in the aortic position. Procedure Date: 10/26/2020. Echo findings are consistent with normal structure and function of the aortic valve prosthesis.  Pulmonic Valve: The pulmonic valve was normal in structure. Pulmonic valve regurgitation is not visualized. No evidence of pulmonic stenosis.  Aorta: The aortic root and ascending aorta are structurally normal, with no evidence of dilitation.  Venous: The inferior vena cava is dilated in size with less than 50% respiratory variability, suggesting right atrial pressure of 15 mmHg.  IAS/Shunts: The atrial septum is grossly normal.  Additional Comments: 3D was performed not requiring image post processing on an independent workstation and was indeterminate.   LEFT VENTRICLE PLAX 2D LVIDd:         4.60 cm     Diastology LVIDs:         2.90 cm     LV e' medial:    5.55 cm/s LV PW:         1.10 cm     LV E/e' medial:  25.9 LV IVS:        1.50 cm     LV e' lateral:   6.64 cm/s LVOT diam:     2.00 cm     LV E/e' lateral: 21.7 LV SV:         81 LV SV Index:   43 LVOT Area:     3.14 cm  LV Volumes (MOD) LV vol d, MOD A2C: 84.9 ml LV vol d, MOD A4C: 68.3 ml LV vol s, MOD A2C: 27.4 ml LV vol  s, MOD A4C: 31.5 ml LV SV MOD A2C:     57.5 ml LV SV MOD A4C:     68.3 ml LV SV MOD BP:      46.8 ml  RIGHT VENTRICLE             IVC RV S prime:     10.90 cm/s  IVC diam: 2.70 cm TAPSE (M-mode): 2.2 cm  LEFT ATRIUM             Index        RIGHT ATRIUM           Index LA diam:        5.20 cm 2.78 cm/m   RA Area:     21.80 cm LA Vol (A2C):   54.6 ml 29.23 ml/m  RA Volume:   64.90 ml  34.74 ml/m LA Vol (A4C):   62.4 ml 33.40 ml/m LA Biplane Vol: 59.4 ml 31.80 ml/m AORTIC VALVE AV Area (Vmax):    1.93 cm AV Area (Vmean):   1.83 cm AV Area (VTI):     1.92 cm AV Vmax:           187.67 cm/s AV Vmean:          122.000 cm/s AV VTI:            0.420 m AV Peak Grad:      14.1 mmHg AV Mean Grad:      9.0 mmHg LVOT Vmax:         115.00 cm/s LVOT Vmean:        71.000 cm/s LVOT VTI:  0.257 m LVOT/AV VTI ratio: 0.61 AI PHT:            514 msec  AORTA Ao Root diam: 3.30 cm Ao Asc diam:  3.25 cm  MITRAL VALVE                TRICUSPID VALVE MV Area (PHT): 4.60 cm     TR Peak grad:   38.7 mmHg MV Area VTI:   1.66 cm     TR Vmax:        311.00 cm/s MV Peak grad:  11.8 mmHg MV Mean grad:  3.0 mmHg     SHUNTS MV Vmax:       1.72 m/s     Systemic VTI:  0.26 m MV Vmean:      74.7 cm/s    Systemic Diam: 2.00 cm MV Decel Time: 165 msec MV E velocity: 144.00 cm/s MV A velocity: 71.80 cm/s MV E/A ratio:  2.01  Stanly Leavens MD Electronically signed by Stanly Leavens MD Signature Date/Time: 05/15/2023/5:44:08 PM    Final    MONITORS  LONG TERM MONITOR-LIVE TELEMETRY (3-14 DAYS) 11/19/2020  Narrative  Predominantly sinus rhythm with minimum heart rate of 49 bpm, maximum 114 bpm. Average 68 bpm.  Total of 206 (Nonsustained Ventricular Tachycardia-V. tach fastest 5 beats continue 39 bpm. Longest 9.3 seconds average rate 1 3 5  bpm.  A. fib burden (16%) rate range 62-139 bpm with average rate 105 bpm. Longest was for 17 hr 55 min. Some aberrant conduction  suspected.  Rare isolated PACs and PVCs. Some PVC couplets and triplets were noted.  No high-grade AV block noted.  Bursts of V. tach could be A. fib with aberrancy.   Patch Wear Time:  12 days and 19 hours (2022-09-22T09:52:09-398 to 2022-10-05T05:32:17-0400)  Abnormal monitor.  Still shows a significant mount of atrial fibrillation and short burst of nonsustained V. tach.  Thankfully, no high-grade AV block.  I think we can continue amiodarone  and probably titrate up beta-blocker.   Alm Clay, MD   CT SCANS  CT CORONARY MORPH W/CTA COR W/SCORE 09/28/2020  Addendum 09/29/2020  1:17 PM ADDENDUM REPORT: 09/29/2020 13:14  CLINICAL DATA:  Pre-op transcatheter aortic valve replacement (TAVR)  EXAM: Cardiac TAVR CT  TECHNIQUE: The patient was scanned on a Siemens Force 192 slice scanner. A 120 kV retrospective scan was triggered in the descending thoracic aorta at 111 HU's. Gantry rotation speed was 270 msecs and collimation was .9 mm. The 3D data set was reconstructed in 5% intervals of the R-R cycle. Systolic and diastolic phases were analyzed on a dedicated work station using MPR, MIP and VRT modes. The patient received 95mL OMNIPAQUE  IOHEXOL  350 MG/ML SOLN of contrast.  FINDINGS: Aortic Valve: Tricuspid aortic valve. Severely reduced cusp separation. Severely thickened, moderately calcified aortic valve cusps.  AV calcium  score: 1049  Virtual Basal Annulus Measurements:  Maximum/Minimum Diameter: 23.1 mm x 18.9 mm  Perimeter: 65.4 mm  Area: 328 mm2  No significant LVOT calcifications.  Based on these measurements, the annulus would be suitable for a 20 mm Edwards Sapien 3 valve vs 23 mm Medtronic Evolut Pro valve. Sinus dimensions are borderline for 26 mm Medtronic Evolut Pro valve. Recommend Heart Team discussion for valve sizing.  Sinus of Valsalva Measurements:  Non-coronary:  26 mm  Right - coronary:  26 mm  Left - coronary:  27 mm  Sinus of  Valsalva Height:  Left: 17.5 mm  Right: 18.6 mm  Aorta: Mild aortic atherosclerosis.  Sinotubular Junction: 27 mm  Ascending Thoracic Aorta:  31 mm  Aortic Arch:  24 mm  Descending Thoracic Aorta:  22 mm  Coronary Artery Height above Annulus:  Left Main: 13 mm  Right Coronary: 11.6 mm  Coronary Arteries: Normal origins. 3 vessel coronary artery calcifications.  Optimum Fluoroscopic Angle for Delivery: RAO 1, CRA 1  Moderate mitral annular calcification.  No left atrial appendage thrombus.  IMPRESSION: 1. Tricuspid aortic valve. Severely reduced cusp separation. Severely thickened, moderately calcified aortic valve cusps.  2.  AV calcium  score: 1049  3. Annulus area: 328 mm2, no significant LVOT calcifications. Based on these measurements, the annulus would be suitable for a 20 mm Edwards Sapien 3 valve vs 23 mm Medtronic Evolut Pro valve. Sinus dimensions are borderline for 26 mm Medtronic Evolut Pro valve. Recommend Heart Team discussion for valve sizing.  4.  Sufficient coronary artery height from annulus.  5.  Optimum Fluoroscopic Angle for Delivery: RAO 1, CRA 1   Electronically Signed By: Soyla Merck M.D. On: 09/29/2020 13:14  Narrative EXAM: OVER-READ INTERPRETATION  CT CHEST  The following report is an over-read performed by radiologist Dr. Toribio Aye of [facility] on 09/29/2020. This over-read does not include interpretation of cardiac or coronary anatomy or pathology. The coronary calcium  score/coronary CTA interpretation by the cardiologist is attached.  COMPARISON:  Cardiac CTA 10/09/2019.  FINDINGS: Extracardiac findings will be described separately under dictation for contemporaneously obtained CTA chest, abdomen and pelvis.  IMPRESSION: Please see separate dictation for contemporaneously obtained CTA chest, abdomen and pelvis dated 09/28/2020 for full description of relevant extracardiac  findings.  Electronically Signed: By: Toribio Aye M.D. On: 09/29/2020 06:07   CT SCANS  CT CORONARY FRACTIONAL FLOW RESERVE DATA PREP 10/09/2019  Narrative EXAM: CT FFR ANALYSIS  CLINICAL DATA:  Chest pain/anginal equiv, 53yr CHD risk 10-20%, not treadmill candidate  FINDINGS: FFRct analysis was performed on the original cardiac CT angiogram dataset. Diagrammatic representation of the FFRct analysis is provided in a separate PDF document in PACS. This dictation was created using the PDF document and an interactive 3D model of the results. 3D model is not available in the EMR/PACS. Normal FFR range is >0.80.  1. Left Main:  No significant stenosis. FFR = 0.99  2. LAD: No significant stenosis. Proximal FFR = 0.97, Mid FFR = 0.90, Distal FFR = 0.85 3. LCX: No significant stenosis. Proximal FFR = 0.98, Distal FFR = 0.91 4. RCA: No significant stenosis. Proximal FFR = 0.96, Mid FFR = 0.92, Distal FFR = 0.91  IMPRESSION: 1.  CT FFR analysis did not show any significant stenosis.   Electronically Signed By: Vinie JAYSON Maxcy M.D. On: 10/10/2019 23:18   CT CORONARY MORPH W/CTA COR W/SCORE 10/09/2019  Addendum 10/09/2019  9:12 PM ADDENDUM REPORT: 10/09/2019 21:10  HISTORY: 83 yo female with chest pain/anginal equiv, 73yr CHD risk 10-20%, not treadmill candidate  EXAM: Cardiac/Coronary CTA  TECHNIQUE: The patient was scanned on a [employer].  PROTOCOL: A 120 kV prospective scan was triggered in the descending thoracic aorta at 111 HU's. Axial non-contrast 3 mm slices were carried out through the heart. The data set was analyzed on a dedicated work station and scored using the Agatson method. Gantry rotation speed was 250 msecs and collimation was .6 mm. Beta blockade and 0.8 mg of sl NTG was given. The 3D data set was reconstructed in 5% intervals of the 67-82 % of the R-R cycle. Diastolic phases were analyzed  on a dedicated work station using MPR, MIP  and VRT modes. The patient received 80mL OMNIPAQUE  IOHEXOL  350 MG/ML SOLN of contrast.  FINDINGS: Quality: Fair, HR 59  Coronary calcium  score: The patient's coronary artery calcium  score is 624, which places the patient in the 82nd percentile.  Coronary arteries: Normal coronary origins.  Right dominance.  Right Coronary Artery: Dominant. Gives off large R-PDA and R-PLB branches. Mild 25-49% proximal mixed stenosis (CADRADS 2). No significant distal or branch disease.  Left Main Coronary Artery: Normal left main. Bifurcates into the LAD and LCx branches.  Left Anterior Descending Coronary Artery: Moderate sized anterior artery which gives off a mid-vessel diagonal branch. There is diffuse calcified plaque with mild 25-49% proximal stenosis (CADRADS3) and minimal mixed mid to distal vessel stenosis (<25% - CADRADS1).  Ramus intermedius: Small branch <2.0 mm, mild to moderate ostial disease  Left Circumflex Artery: AV groove vessel. There is a moderate mixed 50-69% proximal stenosis (CADRADS3). Small distal OM branch without disease.  Aorta: Normal size, 30 mm at the mid ascending aorta (level of the PA bifurcation) measured double oblique. Aortic atherosclerosis. No dissection.  Aortic Valve: Trileaflet with leaflet and annular calcification. Probable aortic stenosis.  Other findings:  Normal pulmonary vein drainage into the left atrium.  Normal left atrial appendage without a thrombus.  Dilated main pulmonary artery to 28 mm, suggestive of pulmonary hypertension.  Mitral annular calcification  IMPRESSION: 1. Diffuse multivessel mixed CAD, possibly significant in the proximal LCx, CADRADS = 3. CT FFR will be performed and reported separately.  2. Coronary calcium  score of 624. This was 82nd percentile for age and sex matched control.  3. Normal coronary origin with right dominance.  4. Dilated main pulmonary artery to 28 mm, suggestive of  pulmonary hypertension.  5. Mitral annular calcification  6. Aortic annular and leaflet calcification, probable aortic stenosis   Electronically Signed By: Vinie JAYSON Maxcy M.D. On: 10/09/2019 21:10  Narrative EXAM: OVER-READ INTERPRETATION  CT CHEST  The following report is an over-read performed by radiologist Dr. Toribio Aye of [facility] on 10/09/2019. This over-read does not include interpretation of cardiac or coronary anatomy or pathology. The coronary calcium  score/coronary CTA interpretation by the cardiologist is attached.  COMPARISON:  None.  FINDINGS: Aortic atherosclerosis. Within the visualized portions of the thorax there are no suspicious appearing pulmonary nodules or masses, there is no acute consolidative airspace disease, no pleural effusions, no pneumothorax and no lymphadenopathy. Visualized portions of the upper abdomen are unremarkable. There are no aggressive appearing lytic or blastic lesions noted in the visualized portions of the skeleton.  IMPRESSION: 1.  Aortic Atherosclerosis (ICD10-I70.0).  Electronically Signed: By: Toribio Aye M.D. On: 10/09/2019 15:23     ______________________________________________________________________________________________      Risk Assessment/Calculations  CHA2DS2-VASc Score = 9   This indicates a 12.2% annual risk of stroke. The patient's score is based upon: CHF History: 1 HTN History: 1 Diabetes History: 1 Stroke History: 2 Vascular Disease History: 1 Age Score: 2 Gender Score: 1           Physical Exam VS:  BP 134/64   Pulse 68   Ht 5' 1 (1.549 m)   Wt 181 lb 9.6 oz (82.4 kg)   SpO2 95%   BMI 34.31 kg/m        Wt Readings from Last 3 Encounters:  07/31/23 181 lb 9.6 oz (82.4 kg)  07/26/23 177 lb (80.3 kg)  07/19/23 177 lb 12.8 oz (80.6 kg)  GEN: Well nourished, well developed in no acute distress NECK: No JVD; No carotid bruits CARDIAC: RRR, no  murmurs, rubs, gallops RESPIRATORY:  Clear to auscultation without rales, wheezing or rhonchi  ABDOMEN: Soft, non-tender, non-distended EXTREMITIES:  No edema; No deformity   ASSESSMENT AND PLAN  PAF: Not on anticoagulation due to history of recurrent GI bleed.  Amiodarone  was stopped during hospitalization earlier this year due to worsening breathing issue.  CAD: History of nonobstructive CAD.  Continue statin therapy.  He denies any chest pain  Chronic diastolic heart failure: Target dry weight 177-178 pounds.  Weight is up to 181 pounds today, trace amount of lower extremity edema for the past few days.  Patient has been instructed to take extra 40 mg of torsemide  in the afternoon for the next 2 days before going back to 40 mg daily thereafter.  In the future, every time her weight goes up to 181 pounds or higher, she has been instructed to take extra torsemide  for 2 days.  Recent blood work showed stable renal function on 6/12.  Will repeat basic metabolic panel near the end of August.  Hypertension: Blood pressure well-controlled  Hyperlipidemia: Continue atorvastatin  40 mg daily  DM2: On insulin , Lantus  was recently increased due to elevated blood sugar level on prednisone  therapy for her breathing.  She is about to come off of prednisone  in the next few days, patient has been instructed to monitor her blood glucose level, if blood glucose level keeps dipping below 100, she has been instructed to talk to her PCP  CKD stage IV: Stable renal function on the recent blood work 6/12, repeat blood work near the end of August.       Dispo: Follow-up with Dr. Anner as previously scheduled in September  Signed, Joelys Staubs, [place]

## 2023-07-31 NOTE — Patient Instructions (Addendum)
 Medication Instructions:  Take extra 2 tablets of torsemide  tomorrow afternoon and following day before going back to 2 tablets daily thereafter. Your dry weight should be around 177-178 lbs, if your weight reaches 181 lbs in the future, you can repeat the same strategy of taking extra torsemide  for 2 days as well.  *If you need a refill on your cardiac medications before your next appointment, please call your pharmacy*  Lab Work: Bmp in late August 2025 If you have labs (blood work) drawn today and your tests are completely normal, you will receive your results only by: MyChart Message (if you have MyChart) OR A paper copy in the mail If you have any lab test that is abnormal or we need to change your treatment, we will call you to review the results.  Testing/Procedures: No testing  Follow-Up: At [facility], you and your health needs are our priority.  As part of our continuing mission to provide you with exceptional heart care, our providers are all part of one team.  This team includes your primary Cardiologist (physician) and Advanced Practice Providers or APPs (Physician Assistants and Nurse Practitioners) who all work together to provide you with the care you need, when you need it.  Your next appointment:   Keep follow up September 2025   Provider:   Alm Clay, MD

## 2023-08-01 ENCOUNTER — Ambulatory Visit: Payer: 59 | Admitting: Internal Medicine

## 2023-08-01 ENCOUNTER — Ambulatory Visit

## 2023-08-01 ENCOUNTER — Telehealth: Payer: Self-pay

## 2023-08-01 VITALS — Ht 61.0 in | Wt 181.0 lb

## 2023-08-01 DIAGNOSIS — Z0000 Encounter for general adult medical examination without abnormal findings: Secondary | ICD-10-CM

## 2023-08-01 DIAGNOSIS — J9610 Chronic respiratory failure, unspecified whether with hypoxia or hypercapnia: Secondary | ICD-10-CM | POA: Diagnosis not present

## 2023-08-01 MED ORDER — TORSEMIDE 20 MG PO TABS: 40.0000 mg | ORAL_TABLET | Freq: Every day | ORAL | 3 refills | Status: AC

## 2023-08-01 NOTE — Telephone Encounter (Signed)
 Patient is requesting a refill on the Torsemide  20 mg BID.

## 2023-08-01 NOTE — Telephone Encounter (Signed)
Ok this is done erx 

## 2023-08-01 NOTE — Progress Notes (Signed)
 Subjective:   Mary Mccarthy is a 83 y.o. who presents for a Medicare Wellness preventive visit.  As a reminder, Annual Wellness Visits don't include a physical exam, and some assessments may be limited, especially if this visit is performed virtually. We may recommend an in-person follow-up visit with your provider if needed.  Visit Complete: Virtual I connected with  Mary Mccarthy on 08/01/23 by a audio enabled telemedicine application and verified that I am speaking with the correct person using two identifiers.  Patient Location: Home  Provider Location: Home Office  I discussed the limitations of evaluation and management by telemedicine. The patient expressed understanding and agreed to proceed.  Vital Signs: Because this visit was a virtual/telehealth visit, some criteria may be missing or patient reported. Any vitals not documented were not able to be obtained and vitals that have been documented are patient reported.  VideoDeclined- This patient declined Librarian, academic. Therefore the visit was completed with audio only.  Persons Participating in Visit: Patient.  AWV Questionnaire: No: Patient Medicare AWV questionnaire was not completed prior to this visit.  Cardiac Risk Factors include: advanced age (>39men, >52 women);hypertension;Other (see comment);diabetes mellitus;dyslipidemia, Risk factor comments: CHF, CKD stage3     Objective:    Today's Vitals   08/01/23 1349  Weight: 181 lb (82.1 kg)  Height: 5' 1 (1.549 m)   Body mass index is 34.2 kg/m.     08/01/2023    2:22 PM 07/26/2023    3:55 PM 05/14/2023    1:00 PM 05/14/2023    8:50 AM 03/01/2023    6:18 AM 02/28/2023    1:16 PM 07/08/2022   11:09 PM  Advanced Directives  Does Patient Have a Medical Advance Directive? Yes Yes No No  No No  Type of Estate agent of [place];Living will Healthcare Power of [place];Living will       Does  patient want to make changes to medical advance directive? No - Patient declined No - Patient declined       Copy of Healthcare Power of Attorney in Chart? Yes - validated most recent copy scanned in chart (See row information) Yes - validated most recent copy scanned in chart (See row information)       Would patient like information on creating a medical advance directive?   No - Patient declined  No - Patient declined  No - Patient declined    Current Medications (verified) Outpatient Encounter Medications as of 08/01/2023  Medication Sig   acetaminophen  (TYLENOL ) 500 MG tablet Take 500 mg by mouth every 6 (six) hours as needed for moderate pain.   ALPRAZolam  (XANAX ) 0.25 MG tablet Take 1 tablet (0.25 mg total) by mouth 3 (three) times daily as needed for anxiety.   atorvastatin  (LIPITOR ) 40 MG tablet TAKE 1 TABLET BY MOUTH EVERY DAY   Blood Glucose Monitoring Suppl DEVI 1 each by Does not apply route in the morning, at noon, and at bedtime. May substitute to any manufacturer covered by patient's insurance.   [employer] VITAMIN B12 1000 MCG tablet TAKE 1 TABLET BY MOUTH EVERY DAY   Glucose Blood (BLOOD GLUCOSE TEST STRIPS) STRP 1 each by In Vitro route in the morning, at noon, and at bedtime. May substitute to any manufacturer covered by patient's insurance.   hydrALAZINE  (APRESOLINE ) 100 MG tablet Take 1 tablet (100 mg total) by mouth 3 (three) times daily. (Patient taking differently: Take 100 mg by mouth 2 (two) times daily.)  insulin  glargine (LANTUS ) 100 UNIT/ML Solostar Pen Inject 40 Units into the skin daily. (Patient taking differently: Inject 42 Units into the skin daily.)   isosorbide  mononitrate (IMDUR ) 60 MG 24 hr tablet Take 1 tablet (60 mg total) by mouth daily.   Lancet Device MISC 1 each by Does not apply route in the morning, at noon, and at bedtime. May substitute to any manufacturer covered by patient's insurance.   Lancets Misc. MISC 1 each by Does not apply route in the morning, at  noon, and at bedtime. May substitute to any manufacturer covered by patient's insurance.   levothyroxine  (SYNTHROID ) 25 MCG tablet TAKE 1 TABLET BY MOUTH EVERY DAY   metoprolol  succinate (TOPROL -XL) 25 MG 24 hr tablet Take 0.5 tablets (12.5 mg total) by mouth daily.   mirtazapine  (REMERON ) 15 MG tablet TAKE 1 TABLET BY MOUTH EVERYDAY AT BEDTIME   pantoprazole  (PROTONIX ) 40 MG tablet TAKE 1 TABLET BY MOUTH TWICE A DAY   predniSONE  (DELTASONE ) 20 MG tablet Take 2 tablets (40 mg total) by mouth daily with breakfast. (Patient taking differently: Take 30 mg by mouth daily with breakfast. Tapering down to discontinue this medication. Patient reports tapering medication as prescribed-son is managing this.)   solifenacin  (VESICARE ) 5 MG tablet Take 1 tablet (5 mg total) by mouth daily.   torsemide  (DEMADEX ) 20 MG tablet Take 2 tablets (40 mg total) by mouth daily.   No facility-administered encounter medications on file as of 08/01/2023.    Allergies (verified) Ibuprofen and Amiodarone    History: Past Medical History:  Diagnosis Date   ALLERGIC RHINITIS 06/24/2007   Anemia    AVM (arteriovenous malformation) of colon    Blood transfusion without reported diagnosis 05/2015   COLONIC POLYPS, HX OF 06/24/2007   Coronary artery disease, non-occlusive 05/2015   Trop + w/ Acute Anemia =>CATH: small RI - Ostial 60%, ostial RCA 30% and dLAD 40-50%;; 9/'21: Cor Ca Score 624. Mild (25-49%) prox RCA & LAD,; Moderate (50-69%) Ostial Small RI & prox LCx.     DEPRESSION 10/08/2006   DIABETES MELLITUS, TYPE II 10/05/2006   GERD 06/24/2007   History of CVA (cerebrovascular accident) 08/10/2020   08/2020- found to be in afib with RVR, started on Eliquis  (shower of emboli from A. fib as complication of severe AS)   HYPERLIPIDEMIA 10/08/2006   HYPERTENSION 10/08/2006   INSOMNIA 09/24/2008   Left knee DJD    Nonsustained paroxysmal ventricular tachycardia (HCC) 10/2018   Post-TAVR Zio Patch: Frequent (206) runs  of NSVT - Fastest 5 beats 239 bpm. Longest 9.3 Sec avg 135 bpm. (? possibly Afib w/ aberrancy)   Osteoporosis 03/10/2016   PAF (paroxysmal atrial fibrillation) (HCC)    PAF with RVR: CHA2DS2-VASc 9.  On Eliquis  and amiodarone . 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 Verlin & Dr. Lucas   Severe aortic stenosis 08/2020   Progression from Mod-Severe AS to Severe AS - noted on Echo 08/2020 -> (mean gradient progressed from 34 to 42 mmHg) this was in setting of new onset A. fib RVR, acute diastolic HF and shower of emboli CVA. ->  Referred for TAVR, completed 10/26/2020   Past Surgical History:  Procedure Laterality Date   APPENDECTOMY     BIOPSY  10/21/2020   Procedure: BIOPSY;  Surgeon: Federico Rosario BROCKS, MD;  Location: [facility] ENDOSCOPY;  Service: Gastroenterology;;  BREAST BIOPSY     CARDIAC CATHETERIZATION N/A 05/21/2015   Procedure: Left Heart Cath and Coronary Angiography;  Surgeon: Alm LELON Clay, MD;  Location: [facility] INVASIVE CV LAB;  Service: Cardiovascular;: Ost RI 60%, Ost RCA 30%, dLAD tapers to small vessel w/ 40-50%. Mildly elevated LVEDP. Normal LV Fxn.   COLONOSCOPY N/A 05/20/2015   Procedure: COLONOSCOPY;  Surgeon: Elspeth Deward Naval, MD;  Location: [facility] ENDOSCOPY;  Service: Gastroenterology;  Laterality: N/A;   CORONARY CA2+ SCORE / CARDIAC CT ANGIOGRAM  10/09/2019   Calcium  score 624.  82nd percentile. Dominant RCA: Mild (25-49%) proximal stenosis-distal bifurcation into PDA and PAV--< RPL branches.  LAD (1 major mid vessel diagonal) diffuse calcified plaque, mild proximal stenosis with minimal distal stenosis.  Small RI moderate ostial disease.  LCx-moderate mixed (50-69%) proximal stenosis.  Small dOM1 disease.  Trileaflet AoV, annular Ca2+ - probable AS   ESOPHAGOGASTRODUODENOSCOPY N/A 05/20/2015   Procedure: ESOPHAGOGASTRODUODENOSCOPY (EGD);   Surgeon: Elspeth Deward Naval, MD;  Location: [facility] ENDOSCOPY;  Service: Gastroenterology;  Laterality: N/A;   ESOPHAGOGASTRODUODENOSCOPY (EGD) WITH PROPOFOL  N/A 10/21/2020   Procedure: ESOPHAGOGASTRODUODENOSCOPY (EGD) WITH PROPOFOL ;  Surgeon: Federico Rosario BROCKS, MD;  Location: [facility] ENDOSCOPY;  Service: Gastroenterology;  Laterality: N/A;   INTRAOPERATIVE TRANSTHORACIC ECHOCARDIOGRAM N/A 10/26/2020   Procedure: INTRAOPERATIVE TRANSTHORACIC ECHOCARDIOGRAM;  Surgeon: Verlin; Location: [facility] OR; Pre-TAVR well-visualized calcified AoV mean Grad 37 mmHg, AVA 0.72 cm => Post TAVR well-positioned supra-annular 26 mm Medtronic Evolut Pro valve placed with no PVL.  Mean gradient 14 mmHg.  AVR 1.58 cm.  Normal flow to RCA and LM-LAD.  ; EF 60 to 65%.  Degenerative severe MAC w/ mild MR.   RIGHT/LEFT HEART CATH AND CORONARY ANGIOGRAPHY N/A 09/29/2020   Procedure: RIGHT/LEFT HEART CATH AND CORONARY ANGIOGRAPHY;  Surgeon: Verlin Lonni BIRCH, MD;  Location: [facility] INVASIVE CV LAB;  Service: Cardiovascular; pre-TAVR:  pRCA 20%, m-dRCA 30%. D LM-pLAD 20%. RI 30%, mLAD 20%.  Severe AS with mean gradient measured at 52.8 mmHg.   TONSILLECTOMY     TRANSCATHETER AORTIC VALVE REPLACEMENT, TRANSFEMORAL N/A 10/26/2020   Procedure: TRANSCATHETER AORTIC VALVE REPLACEMENT, TRANSFEMORAL;  Surgeon: Verlin Lonni BIRCH, MD;  Location: [facility] OR;  Service: Open Heart Surgery;; Medtronic Evolut-Pro + (size 26 mm, model # EVPROPLUS -26US, serial # [id number]); transient high-grade AV block noted so PPM left in place.   TRANSTHORACIC ECHOCARDIOGRAM  03/17/2019   a) 03/2019: EF 60 to 65%.  Moderate LVH.  GRII DD.  Mod-Severe AS (m grad 36 mmHg, peak 59 mmHg); b) 09/2019: EF 65-70%, No RWMA. Gr1 DD/hi LAP, Mild hi PAP. Mod LA Dil. MOD AS (mean Grad 34.5 mmHg).  STABLE   TRANSTHORACIC ECHOCARDIOGRAM  08/27/2020   Admitted for CVA/Afib RVR & CHF (Progression to SEVERE SYMPTOMATIC AS):  Severe Calcific Aortic Stenosis (VTI AVA estimated 0.86 cm,  mean gradient 42 mmHg, V-max 4.36 m/s).  EF 55 to 60%.  Severe concentric LVH.  Unable to determine diastolic parameters.  Moderately elevated PAP.  Mild LA dilation.  Mild circumferential pericardial effusion.  Trivial MR.  Severe MAC.   TRANSTHORACIC ECHOCARDIOGRAM  11/25/2020   1 Month s/p TAVR: EF 55 to 60%.  No R WMA.  GR 1 DD.  Elevated LVEDP.  Normal RV size with mildly elevated RVP estimated 44 mmHg.  Oscillating density in the RV suspect calcified chordal apparatus versus calcified thrombus.  Moderate LA dilation.  Normal IVC/RA P => Well-positioned 26 mm Medtronic Evolut Pro THVwith mean AOV gradient 11 mmHg.  Trivial PVL  TRANSTHORACIC ECHOCARDIOGRAM  11/02/2020   a) Day 1 Post-op TAVR 10/27/20:  Well-Positioned Supra Annular Medtronic Evolut Pro THP.  Mean gradient 12 mmHg, peak 24 mmHg.  AVA 1.8 cm.  Trivial PVL.  EF 60 to 65%.  Normal LV function.  Mild LVH.  Severe LA dilation.  Mild RA dilation.  Severe MAC;; b) 11/02/20: Normal structure and function of the aortic valve prosthesis.  Mean gradient 9 mmHg (otherwise stable)   TUBAL LIGATION     Family History  Problem Relation Age of Onset   Alcohol abuse Other    Arthritis Other        DJD   Hyperlipidemia Other    Heart disease Other    Stroke Other    Hypertension Other    Depression Other    Diabetes Other    Cancer Mother        ENT cancer   CAD Sister        Several MI & PPM; long term smoker, EtOH   Heart attack Sister 25       several   Arrhythmia Sister        s/p PPM (Dr. Francyne)   Social History   Socioeconomic History   Marital status: Widowed    Spouse name: Not on file   Number of children: 1   Years of education: Not on file   Highest education level: High school graduate  Occupational History   Occupation: retired - Oceanographer: RETIRED  Tobacco Use   Smoking status: Never    Passive exposure: Past   Smokeless tobacco: Never  Vaping Use   Vaping status: Never Used   Substance and Sexual Activity   Alcohol use: No    Alcohol/week: 0.0 standard drinks of alcohol   Drug use: No   Sexual activity: Not Currently  Other Topics Concern   Not on file  Social History Narrative   Recently widowed--husband died in 2019/01/02.      Now lives alone but her son usually stays with her at night.  During the day she has 2 nephews who live nearby to come in and check on her intermittently.  Also one of her nieces calls routinely.      When her son is home and awake, she may try to walk on a treadmill, but is scared to walk outside.      Son lives with her and nephew is here during the day/2025   Social Drivers of Health   Financial Resource Strain: High Risk (08/01/2023)   Overall Financial Resource Strain (CARDIA)    Difficulty of Paying Living Expenses: Hard  Food Insecurity: No Food Insecurity (08/01/2023)   Hunger Vital Sign    Worried About Running Out of Food in the Last Year: Never true    Ran Out of Food in the Last Year: Never true  Transportation Needs: No Transportation Needs (08/01/2023)   PRAPARE - Administrator, Civil Service (Medical): No    Lack of Transportation (Non-Medical): No  Physical Activity: Inactive (08/01/2023)   Exercise Vital Sign    Days of Exercise per Week: 0 days    Minutes of Exercise per Session: 0 min  Stress: No Stress Concern Present (08/01/2023)   [employer] of Occupational Health - Occupational Stress Questionnaire    Feeling of Stress: Not at all  Social Connections: Socially Isolated (08/01/2023)   Social Connection and Isolation Panel    Frequency of Communication  with Friends and Family: More than three times a week    Frequency of Social Gatherings with Friends and Family: More than three times a week    Attends Religious Services: Never    Database administrator or Organizations: No    Attends Banker Meetings: Never    Marital Status: Widowed    Tobacco  Counseling Counseling given: Not Answered    Clinical Intake:  Pre-visit preparation completed: Yes  Pain : No/denies pain     BMI - recorded: 34.2 Nutritional Status: BMI > 30  Obese Diabetes: Yes CBG done?: Yes (per pt-133) CBG resulted in Enter/ Edit results?: No Did pt. bring in CBG monitor from home?: No  Lab Results  Component Value Date   HGBA1C 7.9 (H) 07/19/2023   HGBA1C 6.5 03/14/2023   HGBA1C 6.0 (H) 03/01/2023     How often do you need to have someone help you when you read instructions, pamphlets, or other written materials from your doctor or pharmacy?: 1 - Never  Interpreter Needed?: No  Information entered by :: Dilcia Rybarczyk, RMA   Activities of Daily Living     08/01/2023    1:50 PM 05/14/2023    1:00 PM  In your present state of health, do you have any difficulty performing the following activities:  Hearing? 1 1  Comment hearing loss   Vision? 0 1  Difficulty concentrating or making decisions? 0 1  Walking or climbing stairs? 0   Dressing or bathing? 0   Doing errands, shopping? 0 1  Comment son and family drives her   Preparing Food and eating ? N   Using the Toilet? N   In the past six months, have you accidently leaked urine? N   Do you have problems with loss of bowel control? N   Managing your Medications? N   Managing your Finances? N   Housekeeping or managing your Housekeeping? N     Patient Care Team: [name], MD as PCP - General (Internal Medicine) [name], MD as PCP - Cardiology (Cardiology) Croitoru, Jerel, MD as PCP - Electrophysiology (Cardiology) Fate Morna SAILOR, [facility] (Inactive) as Pharmacist (Pharmacist) Charmayne Molly, MD as Consulting Physician (Ophthalmology) Wallace, Juana M, RN as [facility] Management Wallace, Juana M, RN  I have updated your Care Teams any recent Medical Services you may have received from other providers in the past year.     Assessment:   This is a routine wellness  examination for Mary Mccarthy.  Hearing/Vision screen Hearing Screening - Comments:: hearing loss Vision Screening - Comments:: Wears eyeglasses for reading/Dr. lyles   Goals Addressed   None    Depression Screen     08/01/2023    2:26 PM 07/26/2023    3:52 PM 07/19/2023   10:01 AM 06/08/2023    9:44 AM 03/14/2023    8:15 AM 09/25/2022    1:08 PM 05/11/2022    4:18 PM  PHQ 2/9 Scores  PHQ - 2 Score 2 0 0 0 0 0 2  PHQ- 9 Score 2    0 0 10    Fall Risk     08/01/2023    2:22 PM 07/19/2023   10:01 AM 06/20/2023    2:01 PM 06/08/2023    9:43 AM 03/16/2023   11:05 AM  Fall Risk   Falls in the past year? 0 0 0 0   Number falls in past yr: 0 0 0    Injury with  Fall? 0 0 0    Risk for fall due to : Impaired balance/gait No Fall Risks   No Fall Risks  Follow up Falls evaluation completed;Falls prevention discussed Falls evaluation completed   Falls prevention discussed    MEDICARE RISK AT HOME:  Medicare Risk at Home Any stairs in or around the home?: No If so, are there any without handrails?: No Home free of loose throw rugs in walkways, pet beds, electrical cords, etc?: Yes Adequate lighting in your home to reduce risk of falls?: Yes Life alert?: No Use of a cane, walker or w/c?: Yes Grab bars in the bathroom?: Yes Shower chair or bench in shower?: No Elevated toilet seat or a handicapped toilet?: Yes  TIMED UP AND GO:  Was the test performed?  No  Cognitive Function: 6CIT completed    09/07/2016   11:01 AM  MMSE - Mini Mental State Exam  Orientation to time 5   Orientation to Place 5   Registration 3   Attention/ Calculation 5   Recall 2   Language- name 2 objects 2   Language- repeat 1  Language- follow 3 step command 3   Language- read & follow direction 1   Write a sentence 1   Copy design 1   Total score 29      Data saved with a previous flowsheet row definition        08/01/2023    2:22 PM 05/11/2022    4:33 PM 05/09/2021    1:24 PM  6CIT Screen  What Year? 0  points 0 points 0 points  What month? 0 points 0 points 0 points  What time? 0 points 0 points 0 points  Count back from 20 2 points 0 points 0 points  Months in reverse 0 points 0 points 0 points  Repeat phrase 0 points 0 points 2 points  Total Score 2 points 0 points 2 points    Immunizations Immunization History  Administered Date(s) Administered   Fluad Quad(high Dose 65+) 12/26/2018, 12/06/2019, 10/13/2020   Influenza Split 12/21/2010, 12/28/2011   Influenza Whole 12/01/2005, 03/26/2009, 12/07/2009   Influenza, High Dose Seasonal PF 11/19/2012, 11/18/2013, 02/18/2015, 12/28/2015, 12/07/2016, 10/17/2017   PFIZER(Purple Top)SARS-COV-2 Vaccination 05/01/2019, 05/26/2019, 05/12/2020   Pneumococcal Conjugate-13 12/03/2012   Pneumococcal Polysaccharide-23 11/06/2005   Td 09/24/2008   Tdap 09/13/2018, 09/13/2018    Screening Tests Health Maintenance  Topic Date Due   Zoster Vaccines- Shingrix (1 of 2) Never done   OPHTHALMOLOGY EXAM  07/16/2019   Diabetic kidney evaluation - Urine ACR  03/16/2023   INFLUENZA VACCINE  09/07/2023   FOOT EXAM  09/25/2023   HEMOGLOBIN A1C  01/18/2024   Diabetic kidney evaluation - eGFR measurement  07/18/2024   Medicare Annual Wellness (AWV)  07/31/2024   DTaP/Tdap/Td (4 - Td or Tdap) 09/12/2028   Pneumococcal Vaccine: 50+ Years  Completed   DEXA SCAN  Completed   Hepatitis B Vaccines  Aged Out   HPV VACCINES  Aged Out   Meningococcal B Vaccine  Aged Out   COVID-19 Vaccine  Discontinued    Health Maintenance  Health Maintenance Due  Topic Date Due   Zoster Vaccines- Shingrix (1 of 2) Never done   OPHTHALMOLOGY EXAM  07/16/2019   Diabetic kidney evaluation - Urine ACR  03/16/2023   Health Maintenance Items Addressed: See Nurse Notes at the end of this note  Additional Screening:  Vision Screening: Recommended annual ophthalmology exams for early detection of glaucoma and other disorders  of the eye. Would you like a referral to an eye  doctor? No    Dental Screening: Recommended annual dental exams for proper oral hygiene  Community Resource Referral / Chronic Care Management: CRR required this visit?  No   CCM required this visit?  No   Plan:    I have personally reviewed and noted the following in the patient's chart:   Medical and social history Use of alcohol, tobacco or illicit drugs  Current medications and supplements including opioid prescriptions. Patient is not currently taking opioid prescriptions. Functional ability and status Nutritional status Physical activity Advanced directives List of other physicians Hospitalizations, surgeries, and ER visits in previous 12 months Vitals Screenings to include cognitive, depression, and falls Referrals and appointments  In addition, I have reviewed and discussed with patient certain preventive protocols, quality metrics, and best practice recommendations. A written personalized care plan for preventive services as well as general preventive health recommendations were provided to patient.   Romelia Bromell L Rahkeem Senft, CMA   08/01/2023   After Visit Summary: (MyChart) Due to this being a telephonic visit, the after visit summary with patients personalized plan was offered to patient via MyChart   Notes: Patient is due for a diabetic eye exam.  She is also due for a kidney evaluation and can be done during your next office visit.   Patient is also is requesting a refill on her medication.  I have sent a phone message to Dr. Norleen today.

## 2023-08-01 NOTE — Patient Instructions (Signed)
 Mary Mccarthy , Thank you for taking time out of your busy schedule to complete your Annual Wellness Visit with me. I enjoyed our conversation and look forward to speaking with you again next year. I, as well as your care team,  appreciate your ongoing commitment to your health goals. Please review the following plan we discussed and let me know if I can assist you in the future. Your Game plan/ To Do List   Follow up Visits: Next Medicare AWV with our clinical staff: 08/01/2024.   Have you seen your provider in the last 6 months (3 months if uncontrolled diabetes)? Yes Next Office Visit with your provider: 09/27/2023.  Clinician Recommendations:  Aim for 30 minutes of exercise or brisk walking, 6-8 glasses of water, and 5 servings of fruits and vegetables each day. You are due for a diabetic eye exam.  Please call Dr. Charmayne office to schedule an eye exam.        This is a list of the screening recommended for you and due dates:  Health Maintenance  Topic Date Due   Zoster (Shingles) Vaccine (1 of 2) Never done   Eye exam for diabetics  07/16/2019   Yearly kidney health urinalysis for diabetes  03/16/2023   Flu Shot  09/07/2023   Complete foot exam   09/25/2023   Hemoglobin A1C  01/18/2024   Yearly kidney function blood test for diabetes  07/18/2024   Medicare Annual Wellness Visit  07/31/2024   DTaP/Tdap/Td vaccine (4 - Td or Tdap) 09/12/2028   Pneumococcal Vaccine for age over 77  Completed   DEXA scan (bone density measurement)  Completed   Hepatitis B Vaccine  Aged Out   HPV Vaccine  Aged Out   Meningitis B Vaccine  Aged Out   COVID-19 Vaccine  Discontinued    Advanced directives: (In Chart) A copy of your advanced directives are scanned into your chart should your provider ever need it. Advance Care Planning is important because it:  [x]  Makes sure you receive the medical care that is consistent with your values, goals, and preferences  [x]  It provides guidance to your family and  loved ones and reduces their decisional burden about whether or not they are making the right decisions based on your wishes.  Follow the link provided in your after visit summary or read over the paperwork we have mailed to you to help you started getting your Advance Directives in place. If you need assistance in completing these, please reach out to us  so that we can help you!  See attachments for Preventive Care and Fall Prevention Tips.

## 2023-08-09 ENCOUNTER — Encounter: Payer: Self-pay | Admitting: Emergency Medicine

## 2023-08-09 ENCOUNTER — Other Ambulatory Visit

## 2023-08-09 ENCOUNTER — Ambulatory Visit: Admitting: Emergency Medicine

## 2023-08-09 VITALS — BP 122/60 | HR 77 | Temp 98.7°F | Ht 61.0 in | Wt 180.0 lb

## 2023-08-09 DIAGNOSIS — N390 Urinary tract infection, site not specified: Secondary | ICD-10-CM

## 2023-08-09 DIAGNOSIS — R300 Dysuria: Secondary | ICD-10-CM

## 2023-08-09 LAB — POC URINALSYSI DIPSTICK (AUTOMATED)
Bilirubin, UA: NEGATIVE
Blood, UA: NEGATIVE
Glucose, UA: NEGATIVE
Ketones, UA: NEGATIVE
Nitrite, UA: NEGATIVE
Protein, UA: NEGATIVE
Spec Grav, UA: 1.015 (ref 1.010–1.025)
Urobilinogen, UA: 0.2 U/dL
pH, UA: 6 (ref 5.0–8.0)

## 2023-08-09 MED ORDER — CEFUROXIME AXETIL 250 MG PO TABS: 250.0000 mg | ORAL_TABLET | Freq: Two times a day (BID) | ORAL | 0 refills | Status: AC

## 2023-08-09 NOTE — Assessment & Plan Note (Signed)
 Symptom management discussed May use over-the-counter Azo for symptom control Advised to rest and stay well-hydrated Advised to contact the office if no better or worse during the next several days.

## 2023-08-09 NOTE — Assessment & Plan Note (Signed)
 Clinically stable.  No red flag signs or symptoms. No signs of pyelonephritis. Positive urinalysis. Recommend to start Ceftin 250 mg twice a day for 7 days Advised to stay well-hydrated ED precautions given Advised to contact the office if no better or worse during the next several days.

## 2023-08-09 NOTE — Progress Notes (Signed)
 [name] 83 y.o.   Chief Complaint  Patient presents with   Dysuria    Patient here for a possible UTI that has been going on for 3days. She has been having trouble using the bathroom, states her urine is very cloudy and burns very bad when she does go. She is not taking anything OTC for this.     HISTORY OF PRESENT ILLNESS: Acute problem visit today.  Patient of Dr. Lynwood Rush This is a 83 y.o. female complaining of possible UTI.  Complaining of burning on urination along with bad smell and unable to fully empty bladder for the last 3 days Denies fever or chills.  Denies nausea or vomiting.  Denies flank pain. No other complaints or medical concerns today.  Dysuria  Associated symptoms include frequency. Pertinent negatives include no chills, nausea or vomiting.     Prior to Admission medications   Medication Sig Start Date End Date Taking? Authorizing Provider  acetaminophen  (TYLENOL ) 500 MG tablet Take 500 mg by mouth every 6 (six) hours as needed for moderate pain.   Yes [provider]  ALPRAZolam  (XANAX ) 0.25 MG tablet Take 1 tablet (0.25 mg total) by mouth 3 (three) times daily as needed for anxiety. 03/14/23  Yes Rush Lynwood ORN, MD  atorvastatin  (LIPITOR ) 40 MG tablet TAKE 1 TABLET BY MOUTH EVERY DAY 09/04/22  Yes [name], MD  Blood Glucose Monitoring Suppl DEVI 1 each by Does not apply route in the morning, at noon, and at bedtime. May substitute to any manufacturer covered by patient's insurance. 07/19/23  Yes Webb, Padonda B, [name]  [employer] VITAMIN B12 1000 MCG tablet TAKE 1 TABLET BY MOUTH EVERY DAY 03/26/23  Yes Rush Lynwood ORN, MD  Glucose Blood (BLOOD GLUCOSE TEST STRIPS) STRP 1 each by In Vitro route in the morning, at noon, and at bedtime. May substitute to any manufacturer covered by patient's insurance. 07/19/23 08/18/23 Yes Webb, Padonda B, [name]  hydrALAZINE  (APRESOLINE ) 100 MG tablet Take 1 tablet (100 mg total) by mouth 3 (three) times  daily. Patient taking differently: Take 100 mg by mouth 2 (two) times daily. 07/12/23  Yes [name], MD  insulin  glargine (LANTUS ) 100 UNIT/ML Solostar Pen Inject 40 Units into the skin daily. Patient taking differently: Inject 42 Units into the skin daily. 06/27/23  Yes Rush Lynwood ORN, MD  isosorbide  mononitrate (IMDUR ) 60 MG 24 hr tablet Take 1 tablet (60 mg total) by mouth daily. 07/10/23 10/08/23 Yes [name], MD  Lancet Device MISC 1 each by Does not apply route in the morning, at noon, and at bedtime. May substitute to any manufacturer covered by patient's insurance. 07/19/23 08/18/23 Yes Webb, Padonda B, [name]  Lancets Misc. MISC 1 each by Does not apply route in the morning, at noon, and at bedtime. May substitute to any manufacturer covered by patient's insurance. 07/19/23 08/18/23 Yes Webb, Padonda B, [name]  levothyroxine  (SYNTHROID ) 25 MCG tablet TAKE 1 TABLET BY MOUTH EVERY DAY 03/26/23  Yes Rush Lynwood ORN, MD  metoprolol  succinate (TOPROL -XL) 25 MG 24 hr tablet Take 0.5 tablets (12.5 mg total) by mouth daily. 06/12/23  Yes Rush Lynwood ORN, MD  mirtazapine  (REMERON ) 15 MG tablet TAKE 1 TABLET BY MOUTH EVERYDAY AT BEDTIME 10/30/22  Yes Rush Lynwood ORN, MD  pantoprazole  (PROTONIX ) 40 MG tablet TAKE 1 TABLET BY MOUTH TWICE A DAY 12/07/22  Yes Sebastian Lamarr SAUNDERS, PA-C  solifenacin  (VESICARE ) 5 MG tablet Take 1 tablet (5 mg total)  by mouth daily. 03/14/23  Yes [name], MD  torsemide  (DEMADEX ) 20 MG tablet Take 2 tablets (40 mg total) by mouth daily. 08/01/23  Yes [name], MD  predniSONE  (DELTASONE ) 20 MG tablet Take 2 tablets (40 mg total) by mouth daily with breakfast. Patient not taking: Reported on 08/09/2023 06/08/23   Fairy Frames, MD    Allergies  Allergen Reactions   Ibuprofen Other (See Comments)    Bleeding events   Amiodarone  Other (See Comments)    Lung toxicity    Patient Active Problem List   Diagnosis Date Noted   Goals of care, counseling/discussion 07/14/2023    Chronic hypoxic respiratory failure (HCC) 06/28/2023   Aspiration pneumonia (HCC) 05/23/2023   CKD (chronic kidney disease) stage 3, GFR 30-59 ml/min (HCC) 03/18/2023   Urinary frequency 03/29/2022   UTI (urinary tract infection) 03/29/2022   Hypothyroidism 05/28/2021   Vitamin D  deficiency 04/20/2021   Abnormal TSH 04/20/2021   Encounter for well adult exam with abnormal findings 02/16/2021   Type 2 diabetes mellitus with hyperlipidemia (HCC) 11/02/2020   CKD (chronic kidney disease), stage III (HCC) 11/01/2020   Transient post TAVR CHB -> not seen on follow-up Zio patch 10/28/2020   S/P TAVR (transcatheter aortic valve replacement) 10/26/2020   Normocytic anemia 10/19/2020   Acute bronchitis 09/21/2020   Hypercoagulable state due to atrial fibrillation (HCC) 08/23/2020   Longstanding persistent atrial fibrillation (HCC) 08/11/2020   History of CVA (cerebrovascular accident) 08/10/2020   Hypomagnesemia 08/10/2020   Aortic atherosclerosis (HCC) 03/18/2020   B12 deficiency 09/21/2019   Anxiety 09/11/2017   Osteoporosis 03/10/2016   Coronary artery disease, non-occlusive 05/24/2015   GI bleed    Pulmonary hypertension (HCC)    CHF (congestive heart failure), NYHA class II, chronic, diastolic (HCC) 05/20/2015   Gastric ulceration    AVM (arteriovenous malformation) of colon    Right knee pain 05/21/2014   Anemia, iron  deficiency 05/20/2013   Menopausal and postmenopausal disorder 12/07/2009   INSOMNIA 09/24/2008   Allergic rhinitis 06/24/2007   GERD 06/24/2007   History of colonic polyps 06/24/2007   Hyperlipidemia associated with type 2 diabetes mellitus (HCC) 10/08/2006   Anxiety with depression 10/08/2006   Essential hypertension 10/08/2006   Peptic ulcer 10/08/2006   Type 2 diabetes mellitus with hyperglycemia, without long-term current use of insulin  (HCC) 10/05/2006   Morbid obesity (HCC) 10/05/2006    Past Medical History:  Diagnosis Date   ALLERGIC RHINITIS  06/24/2007   Anemia    AVM (arteriovenous malformation) of colon    Blood transfusion without reported diagnosis 05/2015   COLONIC POLYPS, HX OF 06/24/2007   Coronary artery disease, non-occlusive 05/2015   Trop + w/ Acute Anemia =>CATH: small RI - Ostial 60%, ostial RCA 30% and dLAD 40-50%;; 9/'21: Cor Ca Score 624. Mild (25-49%) prox RCA & LAD,; Moderate (50-69%) Ostial Small RI & prox LCx.     DEPRESSION 10/08/2006   DIABETES MELLITUS, TYPE II 10/05/2006   GERD 06/24/2007   History of CVA (cerebrovascular accident) 08/10/2020   08/2020- found to be in afib with RVR, started on Eliquis  (shower of emboli from A. fib as complication of severe AS)   HYPERLIPIDEMIA 10/08/2006   HYPERTENSION 10/08/2006   INSOMNIA 09/24/2008   Left knee DJD    Nonsustained paroxysmal ventricular tachycardia (HCC) 10/2018   Post-TAVR Zio Patch: Frequent (206) runs of NSVT - Fastest 5 beats 239 bpm. Longest 9.3 Sec avg 135 bpm. (? possibly Afib w/ aberrancy)  Osteoporosis 03/10/2016   PAF (paroxysmal atrial fibrillation) (HCC)    PAF with RVR: CHA2DS2-VASc 9.  On Eliquis  and amiodarone . 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 Verlin & Dr. Lucas   Severe aortic stenosis 08/2020   Progression from Mod-Severe AS to Severe AS - noted on Echo 08/2020 -> (mean gradient progressed from 34 to 42 mmHg) this was in setting of new onset A. fib RVR, acute diastolic HF and shower of emboli CVA. ->  Referred for TAVR, completed 10/26/2020    Past Surgical History:  Procedure Laterality Date   APPENDECTOMY     BIOPSY  10/21/2020   Procedure: BIOPSY;  Surgeon: Federico Rosario BROCKS, MD;  Location: [facility] ENDOSCOPY;  Service: Gastroenterology;;   BREAST BIOPSY     CARDIAC CATHETERIZATION N/A 05/21/2015   Procedure: Left Heart Cath and Coronary Angiography;  Surgeon: Alm LELON Clay, MD;   Location: [facility] INVASIVE CV LAB;  Service: Cardiovascular;: Ost RI 60%, Ost RCA 30%, dLAD tapers to small vessel w/ 40-50%. Mildly elevated LVEDP. Normal LV Fxn.   COLONOSCOPY N/A 05/20/2015   Procedure: COLONOSCOPY;  Surgeon: Elspeth Deward Naval, MD;  Location: [facility] ENDOSCOPY;  Service: Gastroenterology;  Laterality: N/A;   CORONARY CA2+ SCORE / CARDIAC CT [facility]  10/09/2019   Calcium  score 624.  82nd percentile. Dominant RCA: Mild (25-49%) proximal stenosis-distal bifurcation into PDA and PAV--< RPL branches.  LAD (1 major mid vessel diagonal) diffuse calcified plaque, mild proximal stenosis with minimal distal stenosis.  Small RI moderate ostial disease.  LCx-moderate mixed (50-69%) proximal stenosis.  Small dOM1 disease.  Trileaflet AoV, annular Ca2+ - probable AS   ESOPHAGOGASTRODUODENOSCOPY N/A 05/20/2015   Procedure: ESOPHAGOGASTRODUODENOSCOPY (EGD);  Surgeon: Elspeth Deward Naval, MD;  Location: [facility] ENDOSCOPY;  Service: Gastroenterology;  Laterality: N/A;   ESOPHAGOGASTRODUODENOSCOPY (EGD) WITH PROPOFOL  N/A 10/21/2020   Procedure: ESOPHAGOGASTRODUODENOSCOPY (EGD) WITH PROPOFOL ;  Surgeon: Federico Rosario BROCKS, MD;  Location: [facility] ENDOSCOPY;  Service: Gastroenterology;  Laterality: N/A;   INTRAOPERATIVE TRANSTHORACIC ECHOCARDIOGRAM N/A 10/26/2020   Procedure: INTRAOPERATIVE TRANSTHORACIC ECHOCARDIOGRAM;  Surgeon: Verlin; Location: [facility] OR; Pre-TAVR well-visualized calcified AoV mean Grad 37 mmHg, AVA 0.72 cm => Post TAVR well-positioned supra-annular 26 mm Medtronic Evolut Pro valve placed with no PVL.  Mean gradient 14 mmHg.  AVR 1.58 cm.  Normal flow to RCA and LM-LAD.  ; EF 60 to 65%.  Degenerative severe MAC w/ mild MR.   RIGHT/LEFT HEART CATH AND CORONARY ANGIOGRAPHY N/A 09/29/2020   Procedure: RIGHT/LEFT HEART CATH AND CORONARY ANGIOGRAPHY;  Surgeon: Verlin Lonni BIRCH, MD;  Location: [facility] INVASIVE CV LAB;  Service: Cardiovascular; pre-TAVR:  pRCA 20%, m-dRCA 30%. D LM-pLAD 20%. RI 30%, mLAD  20%.  Severe AS with mean gradient measured at 52.8 mmHg.   TONSILLECTOMY     TRANSCATHETER AORTIC VALVE REPLACEMENT, TRANSFEMORAL N/A 10/26/2020   Procedure: TRANSCATHETER AORTIC VALVE REPLACEMENT, TRANSFEMORAL;  Surgeon: Verlin Lonni BIRCH, MD;  Location: [facility] OR;  Service: Open Heart Surgery;; Medtronic Evolut-Pro + (size 26 mm, model # EVPROPLUS -26US, serial # [id number]); transient high-grade AV block noted so PPM left in place.   TRANSTHORACIC ECHOCARDIOGRAM  03/17/2019   a) 03/2019: EF 60 to 65%.  Moderate LVH.  GRII DD.  Mod-Severe AS (m grad 36 mmHg, peak 59 mmHg); b) 09/2019: EF 65-70%, No RWMA. Gr1 DD/hi LAP, Mild hi PAP. Mod LA Dil. MOD AS (  mean Grad 34.5 mmHg).  STABLE   TRANSTHORACIC ECHOCARDIOGRAM  08/27/2020   Admitted for CVA/Afib RVR & CHF (Progression to SEVERE SYMPTOMATIC AS):  Severe Calcific Aortic Stenosis (VTI AVA estimated 0.86 cm, mean gradient 42 mmHg, V-max 4.36 m/s).  EF 55 to 60%.  Severe concentric LVH.  Unable to determine diastolic parameters.  Moderately elevated PAP.  Mild LA dilation.  Mild circumferential pericardial effusion.  Trivial MR.  Severe MAC.   TRANSTHORACIC ECHOCARDIOGRAM  11/25/2020   1 Month s/p TAVR: EF 55 to 60%.  No R WMA.  GR 1 DD.  Elevated LVEDP.  Normal RV size with mildly elevated RVP estimated 44 mmHg.  Oscillating density in the RV suspect calcified chordal apparatus versus calcified thrombus.  Moderate LA dilation.  Normal IVC/RA P => Well-positioned 26 mm Medtronic Evolut Pro THVwith mean AOV gradient 11 mmHg.  Trivial PVL   TRANSTHORACIC ECHOCARDIOGRAM  11/02/2020   a) Day 1 Post-op TAVR 10/27/20:  Well-Positioned Supra Annular Medtronic Evolut Pro THP.  Mean gradient 12 mmHg, peak 24 mmHg.  AVA 1.8 cm.  Trivial PVL.  EF 60 to 65%.  Normal LV function.  Mild LVH.  Severe LA dilation.  Mild RA dilation.  Severe MAC;; b) 11/02/20: Normal structure and function of the aortic valve prosthesis.  Mean gradient 9 mmHg (otherwise stable)   TUBAL  LIGATION      Social History   Socioeconomic History   Marital status: Widowed    Spouse name: Not on file   Number of children: 1   Years of education: Not on file   Highest education level: High school graduate  Occupational History   Occupation: retired - Oceanographer: RETIRED  Tobacco Use   Smoking status: Never    Passive exposure: Past   Smokeless tobacco: Never  Vaping Use   Vaping status: Never Used  Substance and Sexual Activity   Alcohol use: No    Alcohol/week: 0.0 standard drinks of alcohol   Drug use: No   Sexual activity: Not Currently  Other Topics Concern   Not on file  Social History Narrative   Recently widowed--husband died in 04-Dec-2018.      Now lives alone but her son usually stays with her at night.  During the day she has 2 nephews who live nearby to come in and check on her intermittently.  Also one of her nieces calls routinely.      When her son is home and awake, she may try to walk on a treadmill, but is scared to walk outside.      Son lives with her and nephew is here during the day/2025   Social Drivers of Health   Financial Resource Strain: High Risk (08/01/2023)   Overall Financial Resource Strain (CARDIA)    Difficulty of Paying Living Expenses: Hard  Food Insecurity: No Food Insecurity (08/01/2023)   Hunger Vital Sign    Worried About Running Out of Food in the Last Year: Never true    Ran Out of Food in the Last Year: Never true  Transportation Needs: No Transportation Needs (08/01/2023)   PRAPARE - Administrator, Civil Service (Medical): No    Lack of Transportation (Non-Medical): No  Physical Activity: Inactive (08/01/2023)   Exercise Vital Sign    Days of Exercise per Week: 0 days    Minutes of Exercise per Session: 0 min  Stress: No Stress Concern Present (08/01/2023)   [employer]  of Occupational Health - Occupational Stress Questionnaire    Feeling of Stress: Not at all  Social  Connections: Socially Isolated (08/01/2023)   Social Connection and Isolation Panel    Frequency of Communication with Friends and Family: More than three times a week    Frequency of Social Gatherings with Friends and Family: More than three times a week    Attends Religious Services: Never    Database administrator or Organizations: No    Attends Banker Meetings: Never    Marital Status: Widowed  Intimate Partner Violence: Patient Unable To Answer (08/01/2023)   Humiliation, Afraid, Rape, and Kick questionnaire    Fear of Current or Ex-Partner: Patient unable to answer    Emotionally Abused: Patient unable to answer    Physically Abused: Patient unable to answer    Sexually Abused: Patient unable to answer    Family History  Problem Relation Age of Onset   Alcohol abuse Other    Arthritis Other        DJD   Hyperlipidemia Other    Heart disease Other    Stroke Other    Hypertension Other    Depression Other    Diabetes Other    Cancer Mother        ENT cancer   CAD Sister        Several MI & PPM; long term smoker, EtOH   Heart attack Sister 62       several   Arrhythmia Sister        s/p PPM (Dr. Francyne)     Review of Systems  Constitutional:  Negative for chills and fever.  HENT: Negative.  Negative for congestion and sore throat.   Respiratory: Negative.  Negative for cough and shortness of breath.   Cardiovascular: Negative.  Negative for chest pain and palpitations.  Gastrointestinal:  Negative for abdominal pain, nausea and vomiting.  Genitourinary:  Positive for dysuria and frequency.  Neurological: Negative.  Negative for dizziness and headaches.    Vitals:   08/09/23 1417  BP: 122/60  Pulse: 77  Temp: 98.7 F (37.1 C)  SpO2: 98%    Physical Exam Vitals reviewed.  Constitutional:      Appearance: Normal appearance.  HENT:     Head: Normocephalic.  Eyes:     Extraocular Movements: Extraocular movements intact.  Cardiovascular:      Rate and Rhythm: Normal rate.     Heart sounds: Murmur heard.  Pulmonary:     Effort: Pulmonary effort is normal.     Breath sounds: Normal breath sounds.  Abdominal:     Palpations: Abdomen is soft.     Tenderness: There is no abdominal tenderness.  Skin:    General: Skin is warm and dry.  Neurological:     Mental Status: She is alert and oriented to person, place, and time.  Psychiatric:        Mood and Affect: Mood normal.        Behavior: Behavior normal.    Results for orders placed or performed in visit on 08/09/23 (from the past 24 hours)  POCT Urinalysis Dipstick (Automated)     Status: Abnormal   Collection Time: 08/09/23  3:27 PM  Result Value Ref Range   Color, UA yellow    Clarity, UA cloudy    Glucose, UA Negative Negative   Bilirubin, UA negative    Ketones, UA negative    Spec Grav, UA 1.015 1.010 - 1.025  Blood, UA negative    pH, UA 6.0 5.0 - 8.0   Protein, UA Negative Negative   Urobilinogen, UA 0.2 0.2 or 1.0 E.U./dL   Nitrite, UA negative    Leukocytes, UA Large (3+) (A) Negative      ASSESSMENT & PLAN: A total of 32 minutes was spent with the patient and counseling/coordination of care regarding preparing for this visit, review of most recent office visit notes, review of multiple chronic medical conditions under management, review of all medications, diagnosis of urinary tract infection and need for antibiotics, prognosis, documentation, ED precautions, and need for follow-up with PCP.  Problem List Items Addressed This Visit       Genitourinary   Acute urinary tract infection - Primary   Clinically stable.  No red flag signs or symptoms. No signs of pyelonephritis. Positive urinalysis. Recommend to start Ceftin 250 mg twice a day for 7 days Advised to stay well-hydrated ED precautions given Advised to contact the office if no better or worse during the next several days.      Relevant Medications   cefUROXime (CEFTIN) 250 MG tablet      Other   Dysuria   Symptom management discussed May use over-the-counter Azo for symptom control Advised to rest and stay well-hydrated Advised to contact the office if no better or worse during the next several days.      Patient Instructions  Urinary Tract Infection, Female A urinary tract infection (UTI) is an infection in your urinary tract. The urinary tract is made up of organs that make, store, and get rid of pee (urine) in your body. These organs include: The kidneys. The ureters. The bladder. The urethra. What are the causes? Most UTIs are caused by germs called bacteria. They may be in or near your genitals. These germs grow and cause swelling in your urinary tract. What increases the risk? You're more likely to get a UTI if: You're a female. The urethra is shorter in females than in males. You have a soft tube called a catheter that drains your pee. You can't control when you pee or poop. You have trouble peeing because of: A kidney stone. A urinary blockage. A nerve condition that affects your bladder. Not getting enough to drink. You're sexually active. You use a birth control inside your vagina, like spermicide. You're pregnant. You have low levels of the hormone estrogen in your body. You're an older adult. You're also more likely to get a UTI if you have other health problems. These may include: Diabetes. A weak immune system. Your immune system is your body's defense system. Sickle cell disease. Injury of the spine. What are the signs or symptoms? Symptoms may include: Needing to pee right away. Peeing small amounts often. Pain or burning when you pee. Blood in your pee. Pee that smells bad or odd. Pain in your belly or lower back. You may also: Feel confused. This may be the first symptom in older adults. Vomit. Not feel hungry. Feel tired or easily annoyed. Have a fever or chills. How is this diagnosed? A UTI is diagnosed based on your medical  history and an exam. You may also have other tests. These may include: Pee tests. Blood tests. Tests for sexually transmitted infections (STIs). If you've had more than one UTI, you may need to have imaging studies done to find out why you keep getting them. How is this treated? A UTI can be treated by: Taking antibiotics or other medicines. Drinking enough  fluid to keep your pee pale yellow. In rare cases, a UTI can cause a very bad condition called sepsis. Sepsis may be treated in the hospital. Follow these instructions at home: Medicines Take your medicines only as told by your health care provider. If you were given antibiotics, take them as told by your provider. Do not stop taking them even if you start to feel better. General instructions Make sure you: Pee often and fully. Do not hold your pee for a long time. Wipe from front to back after you pee or poop. Use each tissue only once when you wipe. Pee after you have sex. Do not douche or use sprays or powders in your genital area. Contact a health care provider if: Your symptoms don't get better after 1-2 days of taking antibiotics. Your symptoms go away and then come back. You have a fever or chills. You vomit or feel like you may vomit. Get help right away if: You have very bad pain in your back or lower belly. You faint. This information is not intended to replace advice given to you by your health care provider. Make sure you discuss any questions you have with your health care provider. Document Revised: 01/03/2023 Document Reviewed: 04/28/2022 Elsevier Patient Education  2025 Elsevier Inc.    Emil Schaumann, MD [facility]

## 2023-08-09 NOTE — Patient Instructions (Signed)

## 2023-08-09 NOTE — Patient Instructions (Signed)
 Visit Information  Thank you for taking time to visit with me today. Please don't hesitate to contact me if I can be of assistance to you before our next scheduled appointment.  Our next appointment is by telephone on 08/16/23 at 1:30 pm. Please call the care guide team at [phone] if you need to cancel or reschedule your appointment.   Following is a copy of your care plan:   Goals Addressed             This Visit's Progress    VBCI RN Care Plan   On track    Problems:  Chronic Disease Management support and education needs related to CHF and HTN  Goal: Over the next 90 days the Patient will attend all scheduled medical appointments: with cardiology and primary care provider as evidenced by patient report and/or review of chart        continue to work with RN Care Manager and/or Social Worker to address care management and care coordination needs related to CHF and HTN as evidenced by adherence to care management team scheduled appointments     demonstrate Improved adherence to prescribed treatment plan for CHF and HTN as evidenced by patient report and/or review of chart take all medications exactly as prescribed and will call provider for medication related questions as evidenced by patient report and/or review of chart     Interventions:   Heart Failure Interventions: Discussed importance of daily weight and advised patient to weigh and record daily Discussed the importance of keeping all appointments with provider Advised patient to continue to monitor Blood pressure and pulse and notify provider if outside of recommended ranges Reiterated the importance of taking medications as prescribed to maintain good blood pressure control in preventing further complications/damage to kidney, heart and other organs.  Assessed BP/ Pulse and daily weights.  Educated on the medication regimen related to fluid retention per cardiology visit on 6/24 with teach-back confirmation with  patient's understanding.  This RN assisted with contacted provider due to symptoms of urination (cloudy/burning and frequency).  Discussed fluid intake  (currently consuming 3-16.9 oz bottles of water daily)  Patient Self-Care Activities:  Attend all scheduled provider appointments Call provider office for new concerns or questions  call office if I gain more than 2 pounds in one day or 5 pounds in one week watch for swelling in feet, ankles and legs every day weigh myself daily take medications for blood pressure exactly as prescribed Target weight 178 lb per cardiology, Ask your cardiologist what your Target BP range is.  Plan:  Telephone follow up appointment with care management team member scheduled for:  08/09/23 at 10:00 am      Please call the Suicide and Crisis Lifeline: 988 call the [place]  National Suicide Prevention Lifeline: (505)708-3640 or TTY: 440-714-0242 TTY (534)783-7849) to talk to a trained counselor call 1-800-273-TALK (toll free, 24 hour hotline) if you are experiencing a Mental Health or Behavioral Health Crisis or need someone to talk to.  Patient verbalizes understanding of instructions and care plan provided today and agrees to view in MyChart. Active MyChart status and patient understanding of how to access instructions and care plan via MyChart confirmed with patient.     Heddy Shutter, RN, MSN, BSN, CCM [place]  [facility], Population Health Case Manager Phone: [phone]

## 2023-08-09 NOTE — Patient Outreach (Signed)
 Complex Care Management   Visit Note  08/09/2023  Name:  Mary Mccarthy MRN: [id number] DOB: [birth year]  Situation: Referral received for Complex Care Management related to Heart Failure and HTN I obtained verbal consent from Patient.  Visit completed with patient  on the phone  Background:   Past Medical History:  Diagnosis Date   ALLERGIC RHINITIS 06/24/2007   Anemia    AVM (arteriovenous malformation) of colon    Blood transfusion without reported diagnosis 05/2015   COLONIC POLYPS, HX OF 06/24/2007   Coronary artery disease, non-occlusive 05/2015   Trop + w/ Acute Anemia =>CATH: small RI - Ostial 60%, ostial RCA 30% and dLAD 40-50%;; 9/'21: Cor Ca Score 624. Mild (25-49%) prox RCA & LAD,; Moderate (50-69%) Ostial Small RI & prox LCx.     DEPRESSION 10/08/2006   DIABETES MELLITUS, TYPE II 10/05/2006   GERD 06/24/2007   History of CVA (cerebrovascular accident) 08/10/2020   08/2020- found to be in afib with RVR, started on Eliquis  (shower of emboli from Mccarthy. fib as complication of severe AS)   HYPERLIPIDEMIA 10/08/2006   HYPERTENSION 10/08/2006   INSOMNIA 09/24/2008   Left knee DJD    Nonsustained paroxysmal ventricular tachycardia (HCC) 10/2018   Post-TAVR Zio Patch: Frequent (206) runs of NSVT - Fastest 5 beats 239 bpm. Longest 9.3 Sec avg 135 bpm. (? possibly Afib w/ aberrancy)   Osteoporosis 03/10/2016   PAF (paroxysmal atrial fibrillation) (HCC)    PAF with RVR: CHA2DS2-VASc 9.  On Eliquis  and amiodarone . 08/11/2020   PEPTIC ULCER DISEASE 10/08/2006   S/P TAVR (transcatheter aortic valve replacement) 10/26/2020   s/p TAVR with Mccarthy 26 mm Medtronic Evolut Pro+ via the TF approach by Dr. Verlin & Dr. Lucas   Severe aortic stenosis 08/2020   Progression from Mod-Severe AS to Severe AS - noted on Echo 08/2020 -> (mean gradient progressed from 34 to 42 mmHg) this was in setting of new onset Mccarthy. fib RVR, acute diastolic HF and shower of emboli CVA. ->  Referred for TAVR,  completed 10/26/2020    Assessment:  Patient Reported Symptoms:  Cognitive Cognitive Status: Alert and oriented to person, place, and time, Normal speech and language skills, Insightful and able to interpret abstract concepts      Neurological Neurological Review of Symptoms: No symptoms reported    HEENT HEENT Symptoms Reported: No symptoms reported      Cardiovascular Cardiovascular Symptoms Reported: Swelling in legs or feet Does patient have uncontrolled Hypertension?: No Is patient checking Blood Pressure at home?: Yes Patient's Recent BP reading at home: Patient reports BP on 08/09/22 124/47 HR 71 and 142/62 HR 73 on 08/08/23 asymptomatic. encouraged patient to recheck BP if outside of recommended parameters. Weight: 181 lb 3.2 oz (82.2 kg) (Patient reports home read) Cardiovascular Self-Management Outcome: 3 (uncertain) Cardiovascular Comment: increased weight gain 179lb on 08/08/23 and 181.2 today. Reviewed instruction per cardiologist visit on 07/31/23 for weight of 181 or above take an extra Torsemide  in the evening for two days, then go back to 40mg  daily dose. Patient confirmed understanding with teachback method. per chart review Target weight 177-178 lb.  Respiratory Respiratory Symptoms Reported: No symptoms reported    Endocrine Endocrine Symptoms Reported: No symptoms reported Is patient diabetic?: Yes Is patient checking blood sugars at home?: Yes List most recent blood sugar readings, include date and time of day: Reports today 135 CBG before meal and 08/08/23 113 for Fasting AM read.    Gastrointestinal Gastrointestinal Symptoms Reported: No  symptoms reported      Genitourinary Genitourinary Symptoms Reported: Pain/burning with urination, Other, Frequency (Patient reports 3-4 days  with reported symptoms. Consumes total of 16.9 oz (3 bottles) of H2O) Other Genitourinary Symptoms: Patient reports burning upon urination, frequency. Patient reports urine appears  cloudy Additional Genitourinary Details: Feels like I am not emptying my bladder Genitourinary Self-Management Outcome: 3 (uncertain) Genitourinary Comment: RNCM assisted patient with calling provider office to notify. Appointment scheduled for today 08/09/23 at 2:20.  Integumentary Integumentary Symptoms Reported: Bruising Additional Integumentary Details: Patient reports bruising hitting hand closing kitchen drawn that is resolving    Musculoskeletal Musculoskelatal Symptoms Reviewed: Limited mobility Additional Musculoskeletal Details: Uses walker when ambulating        Psychosocial Psychosocial Symptoms Reported: No symptoms reported            08/01/2023    2:26 PM  Depression screen PHQ 2/9  Decreased Interest 1  Down, Depressed, Hopeless 1  PHQ - 2 Score 2  Altered sleeping 0  Tired, decreased energy 0  Change in appetite 0  Feeling bad or failure about yourself  0  Trouble concentrating 0  Moving slowly or fidgety/restless 0  Suicidal thoughts 0  PHQ-9 Score 2  Difficult doing work/chores Not difficult at all    Vitals:   08/08/23 1045 08/09/23 1027  BP: (!) 142/62 (!) 124/47  Pulse: 73 71    Medications Reviewed Today     Reviewed by Mary Mccarthy (Registered Nurse) on 08/09/23 at 1012  Med List Status: <None>   Medication Order Taking? Sig Documenting Provider Last Dose Status Informant  acetaminophen  (TYLENOL ) 500 MG tablet [id number] Yes Take 500 mg by mouth every 6 (six) hours as needed for moderate pain. Provider, Historical, Mccarthy  Active Self, Child, Pharmacy Records           Med Note Mary Mccarthy   Mon May 14, 2023  2:52 PM)    ALPRAZolam  (XANAX ) 0.25 MG tablet [id number] Yes Take 1 tablet (0.25 mg total) by mouth 3 (three) times daily as needed for anxiety. Mary Mccarthy  Active Self, Child, Pharmacy Records  atorvastatin  (LIPITOR ) 40 MG tablet [id number] Yes TAKE 1 TABLET BY MOUTH EVERY DAY Mary Mccarthy  Active Self,  Child, Pharmacy Records  Blood Glucose Monitoring Suppl DEVI [id number]  1 each by Does not apply route in the morning, at noon, and at bedtime. May substitute to any manufacturer covered by patient's insurance. Mary Mccarthy  Active   [employer] VITAMIN B12 1000 MCG tablet [id number] Yes TAKE 1 TABLET BY MOUTH EVERY DAY Mary Mccarthy  Active Self, Child, Pharmacy Records  Glucose Blood (BLOOD GLUCOSE TEST STRIPS) STRP [id number]  1 each by In Vitro route in the morning, at noon, and at bedtime. May substitute to any manufacturer covered by patient's insurance. Webb, Padonda Mccarthy, Mccarthy  Active   hydrALAZINE  (APRESOLINE ) 100 MG tablet [id number]  Take 1 tablet (100 mg total) by mouth 3 (three) times daily.  Patient taking differently: Take 100 mg by mouth 2 (two) times daily.   Mary Mccarthy  Active   insulin  glargine (LANTUS ) 100 UNIT/ML Solostar Pen [id number] Yes Inject 40 Units into the skin daily.  Patient taking differently: Inject 42 Units into the skin daily.   Mary Mccarthy  Active   isosorbide  mononitrate (IMDUR ) 60 MG 24 hr tablet [id number] Yes Take 1 tablet (60 mg total) by mouth daily. Mary,  Alm ORN, Mccarthy  Active   Lancet Device MISC [id number]  1 each by Does not apply route in the morning, at noon, and at bedtime. May substitute to any manufacturer covered by patient's insurance. Mary Kenney NOVAK, Mccarthy  Active   Lancets Misc. MISC [id number]  1 each by Does not apply route in the morning, at noon, and at bedtime. May substitute to any manufacturer covered by patient's insurance. Webb, Padonda Mccarthy, Mccarthy  Active   levothyroxine  (SYNTHROID ) 25 MCG tablet [id number] Yes TAKE 1 TABLET BY MOUTH EVERY DAY Mary Mccarthy  Active Self, Child, Pharmacy Records  metoprolol  succinate (TOPROL -XL) 25 MG 24 hr tablet [id number] Yes Take 0.5 tablets (12.5 mg total) by mouth daily. Mary Mccarthy  Active   mirtazapine  (REMERON ) 15 MG tablet [id number] Yes TAKE 1 TABLET BY MOUTH EVERYDAY AT BEDTIME  Mary Mccarthy  Active Self, Child, Pharmacy Records           Med Note [facility], Mary Mccarthy   Mon May 14, 2023  3:12 PM) Pt adamant of taking medication. Dispense report does not support claim.  pantoprazole  (PROTONIX ) 40 MG tablet [id number] Yes TAKE 1 TABLET BY MOUTH TWICE Mccarthy DAY Sebastian Lamarr SAUNDERS, PA-C  Active Self, Child, Pharmacy Records  predniSONE  (DELTASONE ) 20 MG tablet [id number]  Take 2 tablets (40 mg total) by mouth daily with breakfast.  Patient not taking: Reported on 08/09/2023   Fairy Frames, Mccarthy  Active   solifenacin  (VESICARE ) 5 MG tablet [id number] Yes Take 1 tablet (5 mg total) by mouth daily. Mary Mccarthy  Active Self, Child, Pharmacy Records  torsemide  (DEMADEX ) 20 MG tablet [id number] Yes Take 2 tablets (40 mg total) by mouth daily. Mary Mccarthy  Active           Recommendation:   Acute PCP follow-up 08/09/23 at 2:20 am.  Follow Up Plan:   Telephone follow up appointment date/time:  08/16/23 at 1:30 pm  Heddy Shutter, RN, MSN, BSN, CCM [place]  [facility], Population Health Case Manager Phone: [phone]

## 2023-08-16 ENCOUNTER — Other Ambulatory Visit: Payer: Self-pay

## 2023-08-17 NOTE — Patient Outreach (Signed)
 Complex Care Management   Visit Note  08/17/2023  Name:  Mary Mccarthy MRN: [id number] DOB: 17-Jun-1940  Situation: Referral received for Complex Care Management related to Heart Failure and HTN I obtained verbal consent from Patient.  Visit completed with patient  on the phone  Background:   Past Medical History:  Diagnosis Date   ALLERGIC RHINITIS 06/24/2007   Anemia    AVM (arteriovenous malformation) of colon    Blood transfusion without reported diagnosis 05/2015   COLONIC POLYPS, HX OF 06/24/2007   Coronary artery disease, non-occlusive 05/2015   Trop + w/ Acute Anemia =>CATH: small RI - Ostial 60%, ostial RCA 30% and dLAD 40-50%;; 9/'21: Cor Ca Score 624. Mild (25-49%) prox RCA & LAD,; Moderate (50-69%) Ostial Small RI & prox LCx.     DEPRESSION 10/08/2006   DIABETES MELLITUS, TYPE II 10/05/2006   GERD 06/24/2007   History of CVA (cerebrovascular accident) 08/10/2020   08/2020- found to be in afib with RVR, started on Eliquis  (shower of emboli from A. fib as complication of severe AS)   HYPERLIPIDEMIA 10/08/2006   HYPERTENSION 10/08/2006   INSOMNIA 09/24/2008   Left knee DJD    Nonsustained paroxysmal ventricular tachycardia (HCC) 10/2018   Post-TAVR Zio Patch: Frequent (206) runs of NSVT - Fastest 5 beats 239 bpm. Longest 9.3 Sec avg 135 bpm. (? possibly Afib w/ aberrancy)   Osteoporosis 03/10/2016   PAF (paroxysmal atrial fibrillation) (HCC)    PAF with RVR: CHA2DS2-VASc 9.  On Eliquis  and amiodarone . 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 Verlin & Dr. Lucas   Severe aortic stenosis 08/2020   Progression from Mod-Severe AS to Severe AS - noted on Echo 08/2020 -> (mean gradient progressed from 34 to 42 mmHg) this was in setting of new onset A. fib RVR, acute diastolic HF and shower of emboli CVA. ->  Referred for TAVR,  completed 10/26/2020    Assessment: patient denies any questions or concerns today.   Patient Reported Symptoms: Cognitive Cognitive Status: Alert and oriented to person, place, and time, Insightful and able to interpret abstract concepts, Normal speech and language skills      Neurological Neurological Review of Symptoms: No symptoms reported    HEENT HEENT Symptoms Reported: No symptoms reported      Cardiovascular Cardiovascular Symptoms Reported: No symptoms reported (Patient reports swelling is better and is revolving.) Does patient have uncontrolled Hypertension?: No Is patient checking Blood Pressure at home?: Yes Patient's Recent BP reading at home: BP 151/55 this AM  before AM medications and BP 143/55 (08/15/2023). Cardiovascular Management Strategies: Medication therapy, Routine screening Do You Have a Working Readable Scale?: Yes Weight:  (per patient BP on 09/12/23 was 184.4)  Respiratory Respiratory Symptoms Reported: No symptoms reported    Endocrine Endocrine Symptoms Reported: No symptoms reported Is patient diabetic?: Yes Is patient checking blood sugars at home?: Yes List most recent blood sugar readings, include date and time of day: Reports CBG 199 before meal and 08/15/23 it was 125 (fasting reading).    Gastrointestinal Gastrointestinal Symptoms Reported: No symptoms reported      Genitourinary Genitourinary Symptoms Reported: No symptoms reported Other Genitourinary Symptoms: Patient verified visit to PCP for UTI and confirms she is taking the prescribed antibotic (Ceftin ) with much improvement. States she has 2 more days left on the antibotic.  Integumentary Integumentary Symptoms Reported: No symptoms reported    Musculoskeletal Musculoskelatal Symptoms Reviewed: Limited mobility Additional Musculoskeletal Details: Continues to use her walker when ambulating as pt continue to follow  instruction via PT therapy        Psychosocial Psychosocial Symptoms  Reported: No symptoms reported            08/01/2023    2:26 PM  Depression screen PHQ 2/9  Decreased Interest 1  Down, Depressed, Hopeless 1  PHQ - 2 Score 2  Altered sleeping 0  Tired, decreased energy 0  Change in appetite 0  Feeling bad or failure about yourself  0  Trouble concentrating 0  Moving slowly or fidgety/restless 0  Suicidal thoughts 0  PHQ-9 Score 2  Difficult doing work/chores Not difficult at all    Vitals:   08/16/23 1406  BP: (!) 151/55  Pulse: 73  SpO2: 98%    Medications Reviewed Today     Reviewed by Valerie Cones M, RN (Registered Nurse) on 08/16/23 at 1402  Med List Status: <None>   Medication Order Taking? Sig Documenting Provider Last Dose Status Informant  acetaminophen  (TYLENOL ) 500 MG tablet [id number] Yes Take 500 mg by mouth every 6 (six) hours as needed for moderate pain. [provider]  Active Self, Child, Pharmacy Records           Med Note ROLENE Lares, [name]   Mon May 14, 2023  2:52 PM)    ALPRAZolam  (XANAX ) 0.25 MG tablet [id number] Yes Take 1 tablet (0.25 mg total) by mouth 3 (three) times daily as needed for anxiety. [name], MD  Active Self, Child, Pharmacy Records  atorvastatin  (LIPITOR ) 40 MG tablet [id number] Yes TAKE 1 TABLET BY MOUTH EVERY DAY [name], MD  Active Self, Child, Pharmacy Records  Blood Glucose Monitoring Suppl DEVI [id number] Yes 1 each by Does not apply route in the morning, at noon, and at bedtime. May substitute to any manufacturer covered by patient's insurance. Webb, Padonda B, [name]  Active   cefUROXime  (CEFTIN ) 250 MG tablet [id number] Yes Take 1 tablet (250 mg total) by mouth 2 (two) times daily with a meal for 7 days. Sagardia, Miguel Jose, MD  Active   [employer] VITAMIN B12 1000 MCG tablet [id number] Yes TAKE 1 TABLET BY MOUTH EVERY DAY [name], MD  Active Self, Child, Pharmacy Records  Glucose Blood (BLOOD GLUCOSE TEST STRIPS) STRP [id number] Yes 1 each by In Vitro route in the  morning, at noon, and at bedtime. May substitute to any manufacturer covered by patient's insurance. Webb, Padonda B, [name]  Active   hydrALAZINE  (APRESOLINE ) 100 MG tablet [id number] Yes Take 1 tablet (100 mg total) by mouth 3 (three) times daily. [name], MD  Active   insulin  glargine (LANTUS ) 100 UNIT/ML Solostar Pen [id number] Yes Inject 40 Units into the skin daily. [name], MD  Active   isosorbide  mononitrate (IMDUR ) 60 MG 24 hr tablet [id number] Yes Take 1 tablet (60 mg total) by mouth daily. [name], MD  Active   Lancet Device MISC [id number] Yes 1 each by Does not apply route in the morning, at noon, and at bedtime. May substitute to any manufacturer covered by patient's insurance. Douglass Kenney NOVAK, [name]  Active   Lancets Misc. MISC [id number] Yes 1 each by Does not apply route in the morning, at noon, and at bedtime. May substitute to any manufacturer covered by patient's insurance. Webb, Padonda B, [name]  Active   levothyroxine  (SYNTHROID ) 25 MCG tablet [id number] Yes TAKE 1 TABLET BY MOUTH EVERY DAY [name], MD  Active Self, Child, Pharmacy Records  metoprolol  succinate (TOPROL -XL) 25 MG 24 hr tablet [id number] Yes Take 0.5 tablets (12.5 mg total) by mouth daily. [name], MD  Active   mirtazapine  (REMERON ) 15 MG tablet [id number] Yes TAKE 1 TABLET BY MOUTH EVERYDAY AT BEDTIME [name], MD  Active Self, Child, Pharmacy Records           Med Note [facility] [place], [name]   Mon May 14, 2023  3:12 PM) Pt adamant of taking medication. Dispense report does not support claim.  pantoprazole  (PROTONIX ) 40 MG tablet [id number] Yes TAKE 1 TABLET BY MOUTH TWICE A DAY Sebastian Lamarr SAUNDERS, PA-C  Active Self, Child, Pharmacy Records  predniSONE  (DELTASONE ) 20 MG tablet [id number]  Take 2 tablets (40 mg total) by mouth daily with breakfast.  Patient not taking: Reported on 08/16/2023   Fairy Frames, MD  Active   solifenacin  (VESICARE ) 5 MG tablet [id number] Yes Take 1 tablet (5  mg total) by mouth daily. [name], MD  Active Self, Child, Pharmacy Records  torsemide  (DEMADEX ) 20 MG tablet [id number] Yes Take 2 tablets (40 mg total) by mouth daily. [name], MD  Active           Recommendation:   Continue Current Plan of Care  Follow Up Plan:   Telephone follow up appointment date/time:  09/07/23 at 11:00 am  Heddy Shutter, RN, MSN, BSN, CCM [place]  [facility], Population Health Case Manager Phone: [phone]

## 2023-08-17 NOTE — Patient Instructions (Signed)
 Visit Information  Thank you for taking time to visit with me today. Please don't hesitate to contact me if I can be of assistance to you before our next scheduled appointment.  Our next appointment is by telephone on 09/07/23 at 11:00 am. Please call the care guide team at [phone] if you need to cancel or reschedule your appointment.   Following is a copy of your care plan:   Goals Addressed             This Visit's Progress    VBCI RN Care Plan       Problems:  Chronic Disease Management support and education needs related to CHF and HTN  CHF Goal: Over the next 90 days the Patient will attend all scheduled medical appointments: with cardiology and primary care provider as evidenced by patient report and/or review of chart        continue to work with RN Care Manager and/or Social Worker to address care management and care coordination needs related to CHF as evidenced by adherence to care management team scheduled appointments     demonstrate Improved adherence to prescribed treatment plan for CHF as evidenced by patient report and/or review of chart take all medications exactly as prescribed and will call provider for medication related questions as evidenced by patient report and/or review of chart     Interventions:  Heart Failure Interventions: Discussed importance of daily weight and advised patient to weigh and record daily Discussed the importance of keeping all appointments with provider Advised patient to continue to monitor Blood pressure and pulse and notify provider if outside of recommended ranges Assessed daily weights.  Discussed fluid intake  (currently consuming 3-16.9 oz bottles of water daily)  HTN Goal: Over the next 90 days the Patient will attend all scheduled medical appointments: with cardiology and primary care provider as evidenced by patient report and/or review of chart        continue to work with RN Care Manager and/or Social Worker to address  care management and care coordination needs related to HTN as evidenced by adherence to care management team scheduled appointments     demonstrate Improved adherence to prescribed treatment plan for HTN as evidenced by patient report and/or review of chart take all medications exactly as prescribed and will call provider for medication related questions as evidenced by patient report and/or review of chart     HTN Interventions: Discussed importance of monitoring blood pressure and heart rate Discussed importance of attend provider visits as scheduled Advised to continue to monitor BP and pulse and notify provider if outside of recommended ranges Reviewed medications with patient and encouraged to continue to take as prescribed Advised to take medications as prescribed Advised to keep track of HR in addition to BP also  Patient Self-Care Activities:  Attend all scheduled provider appointments Call provider office for new concerns or questions  call office if I gain more than 2 pounds in one day or 5 pounds in one week watch for swelling in feet, ankles and legs every day weigh myself daily Reviewed with patient recommended per cardiologist:Target weight 178 lb.Ask your cardiologist what your Target BP range check blood pressure daily take blood pressure log to all doctor appointments call doctor for signs and symptoms of high blood pressure take medications for blood pressure exactly as prescribed  Plan:  Telephone follow up appointment with care management team member scheduled for:  09/07/23 at 11:00 am      Please  call the Suicide and Crisis Lifeline: 988 call the [place]  National Suicide Prevention Lifeline: 807-266-8888 or TTY: 236 267 7774 TTY 954 120 5811) to talk to a trained counselor if you are experiencing a Mental Health or Behavioral Health Crisis or need someone to talk to.  Patient verbalizes understanding of instructions and care plan provided today and agrees to view  in MyChart. Active MyChart status and patient understanding of how to access instructions and care plan via MyChart confirmed with patient.     Heddy Shutter, RN, MSN, BSN, CCM [place]  [facility], Population Health Case Manager Phone: [phone]

## 2023-08-20 ENCOUNTER — Other Ambulatory Visit: Payer: Self-pay | Admitting: Family

## 2023-08-27 ENCOUNTER — Ambulatory Visit: Payer: Self-pay

## 2023-08-27 NOTE — Telephone Encounter (Signed)
 FYI Only or Action Required?: Action required by provider: clinical question for provider.  Patient was last seen in primary care on 08/09/2023 by Purcell Emil Schanz, MD.  Called Nurse Triage reporting Blood Sugar Problem.  Symptoms began yesterday.  Interventions attempted: Rest, hydration, or home remedies and Dietary changes.  Symptoms are: stable.  Triage Disposition: Call PCP Within 24 Hours  Patient/caregiver understands and will follow disposition?: Yes  Copied from CRM [id number]. Topic: Clinical - Red Word Triage >> Aug 27, 2023  8:58 AM Robinson H wrote: [facility] that prompted transfer to Nurse Triage: Blood sugar 44 yesterday, this morning 77 needs to know what to do, could feel like her blood sugar was low Reason for Disposition  [1] Blood glucose 70 mg/dL (3.9 mmol/L) or below OR symptomatic, now improved with Care Advice AND [2] cause unknown  Answer Assessment - Initial Assessment Questions 1. SYMPTOMS: What symptoms are you concerned about?     Shakiness  2. ONSET:  When did the symptoms start?     Yesterday Afternoon  3. BLOOD GLUCOSE: What is your blood glucose level?      77 This morning, 44 yesterday afternoon  4. USUAL RANGE: What is your blood glucose level usually? (e.g., usual fasting morning value, usual evening value)     135-156  5. TYPE 1 or 2:  Do you know what type of diabetes you have?  (e.g., Type 1, Type 2, Gestational; doesn't know)      Type 2 Diabetic  6. INSULIN : Do you take insulin ? What type of insulin (s) do you use? What is the mode of delivery? (syringe, pen; injection or pump) When did you last give yourself an insulin  dose? (i.e., time or hours/minutes ago) How much did you give? (i.e., how many units)     Yes, 42 Units of Insulin   7. DIABETES PILLS: Do you take any pills for your diabetes? If Yes, ask: What is the name of the medicine(s) that you take for high blood sugar?      No  8. OTHER SYMPTOMS: Do you  have any symptoms? (e.g., fever, frequent urination, difficulty breathing, vomiting)     No  9. LOW BLOOD GLUCOSE TREATMENT: What have you done so far to treat the low blood glucose level?      Ate half a slice of bread and jelly, and blood glucose increased to 135  10. FOOD: When did you last eat or drink?       This morning, half of a Peanut Butter and Jelly Sandwich.   11. ALONE: Are you alone right now or is someone with you?        Son is with her. Comes in several times a day.  12. PREGNANCY: Is there any chance you are pregnant? When was your last menstrual period?       No and No  Protocols used: Diabetes - Low Blood Sugar-A-AH

## 2023-08-29 ENCOUNTER — Other Ambulatory Visit: Payer: Self-pay | Admitting: Internal Medicine

## 2023-08-29 MED ORDER — LANTUS SOLOSTAR 100 UNIT/ML ~~LOC~~ SOPN: 30.0000 [IU] | PEN_INJECTOR | Freq: Every day | SUBCUTANEOUS | 11 refills | Status: DC

## 2023-08-29 NOTE — Telephone Encounter (Signed)
 Ok to decrease the Lantus  to 30 units per day - I will send new script

## 2023-08-30 NOTE — Telephone Encounter (Signed)
 Called and spoke with patient. She informed me that today and yesterday blood sugar levels have been back between 120-150 and have not dropped like they did on 07/21. Did advise her on new Lantus  dosage and stated for her to follow up as needed

## 2023-08-31 DIAGNOSIS — J9610 Chronic respiratory failure, unspecified whether with hypoxia or hypercapnia: Secondary | ICD-10-CM | POA: Diagnosis not present

## 2023-09-07 ENCOUNTER — Other Ambulatory Visit: Payer: Self-pay

## 2023-09-07 DIAGNOSIS — R269 Unspecified abnormalities of gait and mobility: Secondary | ICD-10-CM | POA: Diagnosis not present

## 2023-09-07 DIAGNOSIS — R2681 Unsteadiness on feet: Secondary | ICD-10-CM | POA: Diagnosis not present

## 2023-09-07 DIAGNOSIS — I639 Cerebral infarction, unspecified: Secondary | ICD-10-CM | POA: Diagnosis not present

## 2023-09-07 NOTE — Patient Outreach (Signed)
 Complex Care Management   Visit Note  09/07/2023  Name:  Mary Mccarthy MRN: [id number] DOB: Apr 07, 1940  Situation: Referral received for Complex Care Management related to Heart Failure and HTN I obtained verbal consent from Patient.  Visit completed with patient  on the phone  Background:   Past Medical History:  Diagnosis Date   ALLERGIC RHINITIS 06/24/2007   Anemia    AVM (arteriovenous malformation) of colon    Blood transfusion without reported diagnosis 05/2015   COLONIC POLYPS, HX OF 06/24/2007   Coronary artery disease, non-occlusive 05/2015   Trop + w/ Acute Anemia =>CATH: small RI - Ostial 60%, ostial RCA 30% and dLAD 40-50%;; 9/'21: Cor Ca Score 624. Mild (25-49%) prox RCA & LAD,; Moderate (50-69%) Ostial Small RI & prox LCx.     DEPRESSION 10/08/2006   DIABETES MELLITUS, TYPE II 10/05/2006   GERD 06/24/2007   History of CVA (cerebrovascular accident) 08/10/2020   08/2020- found to be in afib with RVR, started on Eliquis  (shower of emboli from A. fib as complication of severe AS)   HYPERLIPIDEMIA 10/08/2006   HYPERTENSION 10/08/2006   INSOMNIA 09/24/2008   Left knee DJD    Nonsustained paroxysmal ventricular tachycardia (HCC) 10/2018   Post-TAVR Zio Patch: Frequent (206) runs of NSVT - Fastest 5 beats 239 bpm. Longest 9.3 Sec avg 135 bpm. (? possibly Afib w/ aberrancy)   Osteoporosis 03/10/2016   PAF (paroxysmal atrial fibrillation) (HCC)    PAF with RVR: CHA2DS2-VASc 9.  On Eliquis  and amiodarone . 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 Verlin & Dr. Lucas   Severe aortic stenosis 08/2020   Progression from Mod-Severe AS to Severe AS - noted on Echo 08/2020 -> (mean gradient progressed from 34 to 42 mmHg) this was in setting of new onset A. fib RVR, acute diastolic HF and shower of emboli CVA. ->  Referred for TAVR,  completed 10/26/2020    Assessment: Patient Reported Symptoms:  Cognitive Cognitive Status: Alert and oriented to person, place, and time, Insightful and able to interpret abstract concepts, Normal speech and language skills      Neurological Neurological Review of Symptoms: No symptoms reported    HEENT HEENT Symptoms Reported: No symptoms reported      Cardiovascular Cardiovascular Symptoms Reported: Swelling in legs or feet (Patient reports that leg swelling decreased after taking extra dose of torsemide  x 2 days.) Does patient have uncontrolled Hypertension?: Yes Is patient checking Blood Pressure at home?: Yes Patient's Recent BP reading at home: BP 151/58, HR-62 today and 150/56, HR-72 09-06-23, Patient reports reading prior to taking medications and does not have a reading recorded afterwards.  RNCM reinforced the importance of checking BP to make sure medications have worked to decrease blood pressure. Message sent to PCP and Cardiology to update. Cardiovascular Management Strategies: Medication therapy, Fluid modification, Weight management, Activity, Adequate rest, Routine screening Weight: 185 lb 6.4 oz (84.1 kg) (per patient home reading on 09/05/23)  Respiratory Respiratory Symptoms Reported: No symptoms reported Additional Respiratory Details: Reports that she is on 02 at 3L per [place]. 02 sats is 90% on 3L per [place]. Wears o2 at night and as needed during the day. Reports no shortness of breath.    Endocrine Endocrine Symptoms Reported: No symptoms reported Is patient diabetic?: Yes Is patient checking blood sugars at home?: Yes List most  recent blood sugar readings, include date and time of day: Reports that BS was 129 today and 199 on 09-06-23.  Per review of chart noted triage call done on 08/27/23 for hypoglycemia epiodes- Lantus  decreased to 30 units.  Patient reports taking as instructed.  Reviewed signs and symptoms of hypoglycemia and treatment.    Gastrointestinal  Gastrointestinal Symptoms Reported: No symptoms reported      Genitourinary Genitourinary Symptoms Reported: Pain/burning with urination Other Genitourinary Symptoms: Patient reports intermittment burning and itching and states she is using miconazole vaginal supp/cream  recommended by her pharmacist. Patient reports that this is helping.  Per review of chart patient recently treated for UTI.  She states she continues to monitor for signs/symptoms of UTI.  Patient reports will reach out to PCP if symptoms  do not improve or worsens.    Integumentary Integumentary Symptoms Reported: No symptoms reported    Musculoskeletal Musculoskelatal Symptoms Reviewed: No symptoms reported Additional Musculoskeletal Details: Patient states she uses the walker as needed. Denies any falls since last assessment.   Falls in the past year?: No    Psychosocial Psychosocial Symptoms Reported: Anxiety - if selected complete GAD Additional Psychological Details: Offered LCSW services.  Patient declined.            08/01/2023    2:26 PM  Depression screen PHQ 2/9  Decreased Interest 1  Down, Depressed, Hopeless 1  PHQ - 2 Score 2  Altered sleeping 0  Tired, decreased energy 0  Change in appetite 0  Feeling bad or failure about yourself  0  Trouble concentrating 0  Moving slowly or fidgety/restless 0  Suicidal thoughts 0  PHQ-9 Score 2  Difficult doing work/chores Not difficult at all      09/07/2023   11:42 AM 03/27/2022    1:15 PM  GAD 7 : Generalized Anxiety Score  Nervous, Anxious, on Edge 3 3  Control/stop worrying 1 3  Worry too much - different things 3 3  Trouble relaxing 3 3  Restless 0 2  Easily annoyed or irritable 1 2  Afraid - awful might happen 0 2  Total GAD 7 Score 11 18  Anxiety Difficulty Not difficult at all Somewhat difficult     Vitals:   09/07/23 1127 09/07/23 1137  BP: (!) 151/58 (!) 150/56  Pulse: 62 72  SpO2: 90%     Medications Reviewed Today     Reviewed by  Keeven Matty M, RN (Registered Nurse) on 09/07/23 at 1126  Med List Status: <None>   Medication Order Taking? Sig Documenting Provider Last Dose Status Informant  acetaminophen  (TYLENOL ) 500 MG tablet [id number] Yes Take 500 mg by mouth every 6 (six) hours as needed for moderate pain. [provider]  Active Self, Child, Pharmacy Records           Med Note ROLENE Bluebell, [name]   Mon May 14, 2023  2:52 PM)    ALPRAZolam  (XANAX ) 0.25 MG tablet [id number] Yes Take 1 tablet (0.25 mg total) by mouth 3 (three) times daily as needed for anxiety. [name], MD  Active Self, Child, Pharmacy Records  atorvastatin  (LIPITOR ) 40 MG tablet [id number] Yes TAKE 1 TABLET BY MOUTH EVERY DAY [name], MD  Active Self, Child, Pharmacy Records  Blood Glucose Monitoring Suppl DEVI [id number]  1 each by Does not apply route in the morning, at noon, and at bedtime. May substitute to any manufacturer covered by patient's insurance. Webb, Padonda B, [name]  Active  [employer] VITAMIN B12 1000 MCG tablet [id number] Yes TAKE 1 TABLET BY MOUTH EVERY DAY [name], MD  Active Self, Child, Pharmacy Records  hydrALAZINE  (APRESOLINE ) 100 MG tablet [id number] Yes Take 1 tablet (100 mg total) by mouth 3 (three) times daily. [name], MD  Active   insulin  glargine (LANTUS  SOLOSTAR) 100 UNIT/ML Solostar Pen [id number] Yes Inject 30 Units into the skin daily. [name], MD  Active   isosorbide  mononitrate (IMDUR ) 60 MG 24 hr tablet [id number] Yes Take 1 tablet (60 mg total) by mouth daily. [name], MD  Active   levothyroxine  (SYNTHROID ) 25 MCG tablet [id number] Yes TAKE 1 TABLET BY MOUTH EVERY DAY [name], MD  Active Self, Child, Pharmacy Records  metoprolol  succinate (TOPROL -XL) 25 MG 24 hr tablet [id number] Yes Take 0.5 tablets (12.5 mg total) by mouth daily. [name], MD  Active   Miconazole Nitrate (MICONAZOLE 3 COMBO PACK VA) [id number] Yes Place 1 application  vaginally at bedtime.  [provider]  Active   mirtazapine  (REMERON ) 15 MG tablet [id number] Yes TAKE 1 TABLET BY MOUTH EVERYDAY AT BEDTIME [name], MD  Active Self, Child, Pharmacy Records           Med Note [facility] Shafer, [name]   Mon May 14, 2023  3:12 PM) Pt adamant of taking medication. Dispense report does not support claim.  pantoprazole  (PROTONIX ) 40 MG tablet [id number] Yes TAKE 1 TABLET BY MOUTH TWICE A DAY Sebastian Lamarr SAUNDERS, PA-C  Active Self, Child, Pharmacy Records  predniSONE  (DELTASONE ) 20 MG tablet [id number] Yes Take 2 tablets (40 mg total) by mouth daily with breakfast. Fairy Frames, MD  Active   solifenacin  (VESICARE ) 5 MG tablet [id number] Yes Take 1 tablet (5 mg total) by mouth daily. [name], MD  Active Self, Child, Pharmacy Records  torsemide  (DEMADEX ) 20 MG tablet [id number] Yes Take 2 tablets (40 mg total) by mouth daily. [name], MD  Active           Recommendation:   Continue Current Plan of Care PCP and cardiology updated regarding patient weights/BP  Follow Up Plan:   Telephone follow up appointment date/time:  10/09/23 at 1:00 pm  Heddy Shutter, RN, MSN, BSN, CCM [place]  [facility], Population Health Case Manager Phone: [phone]

## 2023-09-07 NOTE — Patient Instructions (Signed)
 Visit Information  Thank you for taking time to visit with me today. Please don't hesitate to contact me if I can be of assistance to you before our next scheduled appointment.  Your next care management appointment is by telephone on 10/09/23 at 1:00 pm   Please call the care guide team at [phone] if you need to cancel, schedule, or reschedule an appointment.   Please call the Suicide and Crisis Lifeline: 988 call the [place]  National Suicide Prevention Lifeline: 4795384171 or TTY: 641-433-0086 TTY 319-376-5268) to talk to a trained counselor call 1-800-273-TALK (toll free, 24 hour hotline) if you are experiencing a Mental Health or Behavioral Health Crisis or need someone to talk to.  Heddy Shutter, RN, MSN, BSN, CCM [place]  [facility], Population Health Case Manager Phone: [phone]

## 2023-09-12 ENCOUNTER — Telehealth: Payer: Self-pay | Admitting: Internal Medicine

## 2023-09-12 NOTE — Telephone Encounter (Signed)
 Copied from CRM [id number]. Topic: Clinical - Medication Question >> Sep 12, 2023 11:06 AM Laymon HERO wrote: Reason for CRM: patient calling to get a refill of Techlight plus needles 32G x 

## 2023-09-13 MED ORDER — TECHLITE PEN NEEDLES 32G X 4 MM MISC: 3 refills | Status: AC

## 2023-09-13 NOTE — Telephone Encounter (Signed)
 Ok done erx

## 2023-09-19 ENCOUNTER — Ambulatory Visit
Admission: RE | Admit: 2023-09-19 | Discharge: 2023-09-19 | Disposition: A | Discharge status: Home / Self Care | Source: Ambulatory Visit | Attending: Pulmonary Disease | Admitting: Pulmonary Disease

## 2023-09-19 DIAGNOSIS — J9809 Other diseases of bronchus, not elsewhere classified: Secondary | ICD-10-CM | POA: Diagnosis not present

## 2023-09-19 DIAGNOSIS — J849 Interstitial pulmonary disease, unspecified: Secondary | ICD-10-CM

## 2023-09-20 ENCOUNTER — Ambulatory Visit (INDEPENDENT_AMBULATORY_CARE_PROVIDER_SITE_OTHER): Admitting: Pulmonary Disease

## 2023-09-20 ENCOUNTER — Encounter: Payer: Self-pay | Admitting: Nurse Practitioner

## 2023-09-20 ENCOUNTER — Ambulatory Visit (INDEPENDENT_AMBULATORY_CARE_PROVIDER_SITE_OTHER): Admitting: Nurse Practitioner

## 2023-09-20 VITALS — BP 115/51 | HR 75 | Temp 97.5°F | Ht 61.0 in | Wt 183.0 lb

## 2023-09-20 DIAGNOSIS — I509 Heart failure, unspecified: Secondary | ICD-10-CM

## 2023-09-20 DIAGNOSIS — R0609 Other forms of dyspnea: Secondary | ICD-10-CM

## 2023-09-20 DIAGNOSIS — I739 Peripheral vascular disease, unspecified: Secondary | ICD-10-CM

## 2023-09-20 DIAGNOSIS — R600 Localized edema: Secondary | ICD-10-CM

## 2023-09-20 DIAGNOSIS — J9611 Chronic respiratory failure with hypoxia: Secondary | ICD-10-CM

## 2023-09-20 DIAGNOSIS — D649 Anemia, unspecified: Secondary | ICD-10-CM

## 2023-09-20 DIAGNOSIS — J849 Interstitial pulmonary disease, unspecified: Secondary | ICD-10-CM | POA: Diagnosis not present

## 2023-09-20 LAB — PULMONARY FUNCTION TEST
DL/VA % pred: 84 %
DL/VA: 3.52 ml/min/mmHg/L
DLCO cor % pred: 57 %
DLCO cor: 9.65 ml/min/mmHg
DLCO unc % pred: 47 %
DLCO unc: 7.94 ml/min/mmHg
FEF 25-75 Post: 1.71 L/s
FEF 25-75 Pre: 1.17 L/s
FEF2575-%Change-Post: 46 %
FEF2575-%Pred-Post: 165 %
FEF2575-%Pred-Pre: 113 %
FEV1-%Change-Post: 13 %
FEV1-%Pred-Post: 96 %
FEV1-%Pred-Pre: 84 %
FEV1-Post: 1.46 L
FEV1-Pre: 1.29 L
FEV1FVC-%Change-Post: 4 %
FEV1FVC-%Pred-Pre: 106 %
FEV6-%Change-Post: 7 %
FEV6-%Pred-Post: 92 %
FEV6-%Pred-Pre: 85 %
FEV6-Post: 1.79 L
FEV6-Pre: 1.66 L
FEV6FVC-%Pred-Post: 106 %
FEV6FVC-%Pred-Pre: 106 %
FVC-%Change-Post: 8 %
FVC-%Pred-Post: 87 %
FVC-%Pred-Pre: 79 %
FVC-Post: 1.81 L
FVC-Pre: 1.66 L
Post FEV1/FVC ratio: 81 %
Post FEV6/FVC ratio: 100 %
Pre FEV1/FVC ratio: 78 %
Pre FEV6/FVC Ratio: 100 %

## 2023-09-20 MED ORDER — ACCU-CHEK GUIDE ME W/DEVICE KIT: PACK | 0 refills | Status: DC

## 2023-09-20 MED ORDER — ALBUTEROL SULFATE HFA 108 (90 BASE) MCG/ACT IN AERS: 1.0000 | INHALATION_SPRAY | Freq: Four times a day (QID) | RESPIRATORY_TRACT | 2 refills | Status: AC | PRN

## 2023-09-20 MED ORDER — ACCU-CHEK GUIDE TEST VI STRP: ORAL_STRIP | 12 refills | Status: AC

## 2023-09-20 NOTE — Progress Notes (Signed)
 @Patient  ID: Mary Mccarthy, female    DOB: 03-23-1940, 83 y.o.   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  She is a patient of Dr. Jiles and last seen in office 06/20/2023.  Past medical history significant for heart failure, PAF, CKD, AAS status post TAVR  TEST/EVENTS:  05/27/2023 CT chest: Atherosclerosis.  Scattered prominent mediastinal nodes, similar to prior and likely reactive.  Small hiatal hernia.  Small left greater than right pleural effusions.  Diffuse multifocal patchy airspace and groundglass opacities throughout both lungs. 09/20/2023 PFT: FVC 79, FEV1 84, ratio 81, DLCOcor 57  06/20/2023: OV with Dr. Theophilus.  Hospitalized from April 7 to Jun 07, 2023 for acute hypoxic respiratory failure.  Previously on 2 L of O2.  Continues to require oxygen  for support.  During her hospitalization she was treated with diuretics for suspected heart failure, which help reduce fluid overload.  Still having shortness of breath and swelling in the legs.  This prevents her from wearing regular shoes.  There was also some concern for amiodarone  lung toxicity so this was discontinued and she was started on prednisone  40 mg daily.  She was also treated with antibiotics in the hospital due to a possible aspiration event.  Has CHF and CKD.  Biopsy not recommended due to age and condition.  Plan to taper off prednisone  now that she is off amiodarone .  Check autoimmune serologies.  Schedule high-resolution CT chest and PFT in 2 to 3 months.  Discussed prognosis and goals of care.  Prefers to be informed about her condition and prognosis and avoid aggressive interventions.  Currently under palliative and hospice care.  09/20/2023: Today-follow up Discussed the use of AI scribe software for clinical note transcription with the patient, who  gave verbal consent to proceed.  History of Present Illness   Mary Mccarthy is an 83 year old female who presents for follow-up after PFT. She is accompanied by her son.  he has a mildly moderate restriction and reduced diffusion capacity that corrects to normal for alveolar volume.  No obstructive lung disease.  Did have a little bit of improvement after bronchodilator administration.  Her breathing has improved since the last visit. She uses three liters of oxygen  at night and does not monitor her oxygen  levels while moving.  Not really using her oxygen  during the day.  She experiences occasional shortness of breath and a slight wheeze when exerted. The inhaler used during a test made her shaky but she is unsure if it made any difference in her breathing or shortness of breath.  No cough, hemoptysis, fevers.  She describes swelling in her legs as improved, but notes new onset of redness and warmth in both calves which has been ongoing for about a month.  Has not been moving as much as she was prior to her hospitalization.  Relatively sedentary.  No numbness, tingling, or pain when walking. She is not experiencing calf pain.   Her weight has increased slightly since the last visit but stable since she saw cardiology last time, and she is above her target weight of 178-179 pounds. She is mindful of her salt intake.       Allergies  Allergen Reactions   Ibuprofen Other (See Comments)    Bleeding events   Amiodarone  Other (See Comments)  Lung toxicity    Immunization History  Administered Date(s) Administered   Fluad Quad(high Dose 65+) 12/26/2018, 12/06/2019, 10/13/2020   Influenza Split 12/21/2010, 12/28/2011   Influenza Whole 12/01/2005, 03/26/2009, 12/07/2009   Influenza, High Dose Seasonal PF 11/19/2012, 11/18/2013, 02/18/2015, 12/28/2015, 12/07/2016, 10/17/2017   PFIZER(Purple Top)SARS-COV-2 Vaccination 05/01/2019, 05/26/2019, 05/12/2020   Pneumococcal Conjugate-13  12/03/2012   Pneumococcal Polysaccharide-23 11/06/2005   Td 09/24/2008   Tdap 09/13/2018, 09/13/2018    Past Medical History:  Diagnosis Date   ALLERGIC RHINITIS 06/24/2007   Anemia    AVM (arteriovenous malformation) of colon    Blood transfusion without reported diagnosis 05/2015   COLONIC POLYPS, HX OF 06/24/2007   Coronary artery disease, non-occlusive 05/2015   Trop + w/ Acute Anemia =>CATH: small RI - Ostial 60%, ostial RCA 30% and dLAD 40-50%;; 9/'21: Cor Ca Score 624. Mild (25-49%) prox RCA & LAD,; Moderate (50-69%) Ostial Small RI & prox LCx.     DEPRESSION 10/08/2006   DIABETES MELLITUS, TYPE II 10/05/2006   GERD 06/24/2007   History of CVA (cerebrovascular accident) 08/10/2020   08/2020- found to be in afib with RVR, started on Eliquis  (shower of emboli from A. fib as complication of severe AS)   HYPERLIPIDEMIA 10/08/2006   HYPERTENSION 10/08/2006   INSOMNIA 09/24/2008   Left knee DJD    Nonsustained paroxysmal ventricular tachycardia (HCC) 10/2018   Post-TAVR Zio Patch: Frequent (206) runs of NSVT - Fastest 5 beats 239 bpm. Longest 9.3 Sec avg 135 bpm. (? possibly Afib w/ aberrancy)   Osteoporosis 03/10/2016   PAF (paroxysmal atrial fibrillation) (HCC)    PAF with RVR: CHA2DS2-VASc 9.  On Eliquis  and amiodarone . 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 Verlin & Dr. Lucas   Severe aortic stenosis 08/2020   Progression from Mod-Severe AS to Severe AS - noted on Echo 08/2020 -> (mean gradient progressed from 34 to 42 mmHg) this was in setting of new onset A. fib RVR, acute diastolic HF and shower of emboli CVA. ->  Referred for TAVR, completed 10/26/2020    Tobacco History: Social History   Tobacco Use  Smoking Status Never   Passive exposure: Past  Smokeless Tobacco Never   Counseling given: Not Answered   Outpatient  Medications Prior to Visit  Medication Sig Dispense Refill   acetaminophen  (TYLENOL ) 500 MG tablet Take 500 mg by mouth every 6 (six) hours as needed for moderate pain.     ALPRAZolam  (XANAX ) 0.25 MG tablet Take 1 tablet (0.25 mg total) by mouth 3 (three) times daily as needed for anxiety. 90 tablet 2   atorvastatin  (LIPITOR ) 40 MG tablet TAKE 1 TABLET BY MOUTH EVERY DAY 90 tablet 3   Blood Glucose Monitoring Suppl DEVI 1 each by Does not apply route in the morning, at noon, and at bedtime. May substitute to any manufacturer covered by patient's insurance. 1 each 0   [facility] VITAMIN B12 1000 MCG tablet TAKE 1 TABLET BY MOUTH EVERY DAY 90 tablet 3   hydrALAZINE  (APRESOLINE ) 100 MG tablet Take 1 tablet (100 mg total) by mouth 3 (three) times daily. 270 tablet 3   insulin  glargine (LANTUS  SOLOSTAR) 100 UNIT/ML Solostar Pen Inject 30 Units into the skin daily. 15 mL 11   Insulin  Pen Needle (TECHLITE PEN NEEDLES) 32G X 4 MM MISC Use as directed once daily with insulin   shot 100 each 3   isosorbide  mononitrate (IMDUR ) 60 MG 24 hr tablet Take 1 tablet (60 mg total) by mouth daily. 90 tablet 3   levothyroxine  (SYNTHROID ) 25 MCG tablet TAKE 1 TABLET BY MOUTH EVERY DAY 90 tablet 2   metoprolol  succinate (TOPROL -XL) 25 MG 24 hr tablet Take 0.5 tablets (12.5 mg total) by mouth daily. 45 tablet 3   Miconazole Nitrate (MICONAZOLE 3 COMBO PACK VA) Place 1 application  vaginally at bedtime.     mirtazapine  (REMERON ) 15 MG tablet TAKE 1 TABLET BY MOUTH EVERYDAY AT BEDTIME 90 tablet 3   pantoprazole  (PROTONIX ) 40 MG tablet TAKE 1 TABLET BY MOUTH TWICE A DAY 180 tablet 1   solifenacin  (VESICARE ) 5 MG tablet Take 1 tablet (5 mg total) by mouth daily. 90 tablet 3   torsemide  (DEMADEX ) 20 MG tablet Take 2 tablets (40 mg total) by mouth daily. 180 tablet 3   predniSONE  (DELTASONE ) 20 MG tablet Take 2 tablets (40 mg total) by mouth daily with breakfast. (Patient not taking: Reported on 09/20/2023) 60 tablet 1   No  facility-administered medications prior to visit.     Review of Systems:   Constitutional: No weight loss or gain, night sweats, fevers, chills, fatigue, or lassitude. HEENT: No headaches, difficulty swallowing, tooth/dental problems, or sore throat. No sneezing, itching, ear ache, nasal congestion, or post nasal drip CV:  No chest pain, orthopnea, PND, anasarca, dizziness, palpitations, syncope Resp: + shortness of breath with exertion (improved). No excess mucus or change in color of mucus. No productive or non-productive. No hemoptysis. No wheezing.  No chest wall deformity GI:  No heartburn, indigestion, loss of appetite, bloody stools.  GU: No dysuria, change in color of urine, urgency or frequency.  Skin: No rash, lesions, ulcerations MSK:  +calf redness/warmth Neuro: No dizziness or lightheadedness.  Psych: No depression or anxiety. Mood stable.     Physical Exam:  BP (!) 115/51   Pulse 75   Temp (!) 97.5 F (36.4 C)   Ht 5' 1 (1.549 m)   Wt 183 lb (83 kg)   SpO2 90% Comment: RA  BMI 34.58 kg/m   GEN: Pleasant, interactive, chronically-ill appearing; obese; in no acute distress HEENT:  Normocephalic and atraumatic. PERRLA. Sclera white. Nasal turbinates pink, moist and patent bilaterally. No rhinorrhea present. Oropharynx pink and moist, without exudate or edema. No lesions, ulcerations, or postnasal drip.  NECK:  Supple w/ fair ROM. No JVD present. Thyroid  symmetrical with no goiter or nodules palpated. No lymphadenopathy.   CV: RRR, no m/r/g, dependent peripheral edema. Pulses intact, +2 bilaterally. No cyanosis, pallor or clubbing. PULMONARY:  Unlabored, regular breathing. Clear bilaterally A&P w/o wheezes/rales/rhonchi. No accessory muscle use.  GI: BS present and normoactive. Soft, non-tender to palpation.  MSK: No erythema, warmth or tenderness on exam. Cap refil <2 sec all extrem.  Neuro: A/Ox3. No focal deficits noted.   Skin: Warm, no lesions or rashe Psych:  Normal affect and behavior. Judgement and thought content appropriate.     Lab Results:  CBC    Component Value Date/Time   WBC 8.0 07/17/2023 1900   RBC 2.87 (L) 07/17/2023 1900   HGB 8.8 (L) 07/17/2023 1915   HGB 12.2 01/02/2022 1433   HCT 26.0 (L) 07/17/2023 1915   HCT 35.7 01/02/2022 1433   PLT 65 (L) 07/17/2023 1900   PLT 196 01/02/2022 1433   MCV 99.7 07/17/2023 1900   MCV 100 (H) 01/02/2022 1433   MCH 32.1 07/17/2023 1900  MCHC 32.2 07/17/2023 1900   RDW 19.0 (H) 07/17/2023 1900   RDW 12.2 01/02/2022 1433   LYMPHSABS 1.5 07/17/2023 1900   LYMPHSABS 1.6 02/11/2021 1538   MONOABS 0.3 07/17/2023 1900   EOSABS 0.0 07/17/2023 1900   EOSABS 0.0 02/11/2021 1538   BASOSABS 0.0 07/17/2023 1900   BASOSABS 0.0 02/11/2021 1538    BMET    Component Value Date/Time   NA 139 07/19/2023 1035   NA 140 04/06/2023 1448   K 3.5 07/19/2023 1035   CL 99 07/19/2023 1035   CO2 32 07/19/2023 1035   GLUCOSE 194 (H) 07/19/2023 1035   GLUCOSE 127 (H) 12/01/2005 1330   BUN 43 (H) 07/19/2023 1035   BUN 34 (H) 04/06/2023 1448   CREATININE 1.77 (H) 07/19/2023 1035   CALCIUM 8.8 07/19/2023 1035   GFRNONAA 23 (L) 07/17/2023 1900   GFRAA 57 (L) 10/01/2019 1249    BNP    Component Value Date/Time   BNP 322.8 (H) 06/03/2023 1217     Imaging:  No results found.  Administration History     None          Latest Ref Rng & Units 06/20/2023    3:38 PM 02/02/2021   11:41 AM  PFT Results  FVC-Pre L 1.66  P 1.35   FVC-Predicted Pre % 79  P 62   FVC-Post L 1.81  P 1.51   FVC-Predicted Post % 87  P 69   Pre FEV1/FVC % % 78  P 79   Post FEV1/FCV % % 81  P 77   FEV1-Pre L 1.29  P 1.07   FEV1-Predicted Pre % 84  P 66   FEV1-Post L 1.46  P 1.17   DLCO uncorrected ml/min/mmHg 7.94  P 4.57   DLCO UNC% % 47  P 27   DLCO corrected ml/min/mmHg 9.65  P   DLCO COR %Predicted % 57  P   DLVA Predicted % 84  P 61   TLC L  4.13   TLC % Predicted %  89   RV % Predicted %  120     P  Preliminary result    No results found for: NITRICOXIDE      Assessment & Plan:   No problem-specific Assessment & Plan notes found for this encounter. Assessment and Plan    Chronic respiratory failure with hypoxia Chronic respiratory failure with hypoxia, improved breathing since last visit. Some mild wheezing occurs with exertion. Albuterol inhaler improved lung function, suggesting possible underlying asthma. Most breathing issues likely due to heart failure and anemia rather than reversible airway obstruction. - Prescribe albuterol inhaler for use with wheezing or shortness of breath, instruct to use 1-2 puffs as needed - Monitor oxygen levels during activity; Goal >88-90% - Exertional walk test today without desaturation on room air  - Overnight oxygen study on 2 lpm   Amiodarone lung toxicity Clinically improved. Off amio. Mild restriction in lung function. Awaiting HRCT chest results.   Heart failure Heart failure with weight slightly above target. Fluid retention likely contributing to respiratory symptoms. - Advise monitoring of salt intake to manage fluid retention - Monitor weights at home - Follow up with cardiology as scheduled   Anemia Anemia contributing to low gas exchange and hypoxia. Low hemoglobin levels likely affecting oxygen transport and contributing to shortness of breath. - Continue to monitor   Peripheral vascular disease Peripheral vascular disease suspected due to redness and warmth in legs, without numbness, tingling, or pain. Symptoms  present for about a month. Differential includes vascular disease versus blood clots. No current blood thinner use.  - Order ultrasounds of lower legs to rule out blood clots - Advise to report persistent symptoms to primary care provider        Advised if symptoms do not improve or worsen, to please contact office for sooner follow up or seek emergency care.   I spent 38 minutes of dedicated to the care of  this patient on the date of this encounter to include pre-visit review of records, face-to-face time with the patient discussing conditions above, post visit ordering of testing, clinical documentation with the electronic health record, making appropriate referrals as documented, and communicating necessary findings to members of the patients care team.  Comer LULLA Rouleau, NP 09/20/2023  Pt aware and understands NP's role.

## 2023-09-20 NOTE — Patient Instructions (Signed)
 Spirometry pre/post and diffusion capacity performed today.

## 2023-09-20 NOTE — Progress Notes (Signed)
 Spirometry pre/post and diffusion capacity performed today.Patient unable to keep pant frequency therefore unable to perform lung volumes.

## 2023-09-20 NOTE — Telephone Encounter (Signed)
 Copied from CRM [id number]. Topic: Clinical - Medical Advice >> Sep 19, 2023  2:31 PM Chasity T wrote: Reason for CRM: [facility]lla [facility] is calling in to advise that Blood Glucose Monitoring Suppl DEVI will be discontinued and a new order will need to be placed for patient.   The options are: contour plus,contour next, or accu-chek

## 2023-09-20 NOTE — Telephone Encounter (Signed)
 Ok for accucheck guide me - done erx

## 2023-09-20 NOTE — Patient Instructions (Addendum)
 Trial Albuterol  inhaler 1- 2 puffs every 6 hours as needed for shortness of breath or wheezing. Notify if symptoms persist despite rescue inhaler/neb use.  Continue supplemental oxygen  at night and as needed with activity for oxygen  goal >88-90%. Decrease to 2 lpm at night and we will reassess with an overnight oxygen  study - someone should contact you to schedule this   Suspect your shortness of breath is related to your heart disease, deconditioning from being hospitalized, and your anemia. Your lung function testing showed normal lung function with some improvement following the inhaler therapy. You do have a decrease in your gas exchange, which you can see with anemia   Since you're having some increased redness and warmth to your calves, we will have you complete an ultrasound to rule out any blood clots given your history   No results from your CT chest just yet. Should be back in 7-10 days. Call us  if you haven't heard anything in 10 days.   Follow up in 3 months with Dr. Theophilus. If symptoms do not improve or worsen, please contact office for sooner follow up or seek emergency care.

## 2023-09-21 ENCOUNTER — Ambulatory Visit (HOSPITAL_COMMUNITY)
Admission: RE | Admit: 2023-09-21 | Discharge: 2023-09-21 | Disposition: A | Discharge status: Home / Self Care | Source: Ambulatory Visit | Attending: Nurse Practitioner | Admitting: Nurse Practitioner

## 2023-09-21 ENCOUNTER — Ambulatory Visit: Payer: Self-pay | Admitting: Nurse Practitioner

## 2023-09-21 ENCOUNTER — Encounter: Payer: Self-pay | Admitting: Nurse Practitioner

## 2023-09-21 DIAGNOSIS — R600 Localized edema: Secondary | ICD-10-CM | POA: Insufficient documentation

## 2023-09-24 NOTE — Progress Notes (Signed)
 Pt.notified

## 2023-09-26 ENCOUNTER — Other Ambulatory Visit: Payer: Self-pay | Admitting: Internal Medicine

## 2023-09-26 NOTE — Telephone Encounter (Signed)
 Medication: Xanax  Directions: Take 1 tablet (0.25 mg total) by mouth 3 (three) times daily as needed for anxiety  Last given: 03/14/23 Number refills: 2 Last o/v: 08/09/23 Follow up: N/A Labs:  07/19/23  Please review refill request.

## 2023-09-27 ENCOUNTER — Encounter: Payer: Self-pay | Admitting: Internal Medicine

## 2023-09-27 ENCOUNTER — Ambulatory Visit: Admitting: Internal Medicine

## 2023-09-27 VITALS — BP 130/62 | HR 66 | Temp 98.7°F | Ht 61.0 in | Wt 186.4 lb

## 2023-09-27 DIAGNOSIS — E1169 Type 2 diabetes mellitus with other specified complication: Secondary | ICD-10-CM

## 2023-09-27 DIAGNOSIS — I10 Essential (primary) hypertension: Secondary | ICD-10-CM | POA: Diagnosis not present

## 2023-09-27 DIAGNOSIS — N1832 Chronic kidney disease, stage 3b: Secondary | ICD-10-CM | POA: Diagnosis not present

## 2023-09-27 DIAGNOSIS — E785 Hyperlipidemia, unspecified: Secondary | ICD-10-CM

## 2023-09-27 DIAGNOSIS — Z794 Long term (current) use of insulin: Secondary | ICD-10-CM

## 2023-09-27 DIAGNOSIS — I5032 Chronic diastolic (congestive) heart failure: Secondary | ICD-10-CM | POA: Diagnosis not present

## 2023-09-27 MED ORDER — LANTUS SOLOSTAR 100 UNIT/ML ~~LOC~~ SOPN: 35.0000 [IU] | PEN_INJECTOR | Freq: Every day | SUBCUTANEOUS | 11 refills | Status: AC

## 2023-09-27 NOTE — Patient Instructions (Addendum)
 Ok to increase the lantus  to 35 units per day  Ok to take extra 20 mg of torsemide  for the next 2 days to help with the swelling   Please continue all other medications as before, and refills have been done if requested.  Please have the pharmacy call with any other refills you may need.  Please continue your efforts at being more active, low cholesterol diet, and weight control.  Please keep your appointments with your specialists as you may have planned - the sleeping test at home  Please make an Appointment to return in 3 months, or sooner if needed

## 2023-09-27 NOTE — Assessment & Plan Note (Signed)
 BP Readings from Last 3 Encounters:  09/27/23 130/62  09/20/23 (!) 115/51  09/07/23 (!) 150/56   Stable, pt to continue medical treatment hdyralazinee 100 tid, toprol  xl 12.5 qd

## 2023-09-27 NOTE — Assessment & Plan Note (Signed)
 Lab Results  Component Value Date   HGBA1C 7.9 (H) 07/19/2023   Uncontrolled, but pt had frequent low sugars with 40 units lantus , and now too high often > 200 on 30 units, so ok to take 35 u sq every day,

## 2023-09-27 NOTE — Progress Notes (Signed)
 Patient ID: Mary Mccarthy, female   DOB: [birth year], 83 y.o.   MRN: [id number]        Chief Complaint: follow up chf, dm, ckd3a, htn       HPI:  Mary Mccarthy is a 83 y.o. female here with family, overall doing ok, but has gained several lbs and with slightly worsening leg swelling left > right, but Pt denies chest pain, increased sob or doe, wheezing, orthopnea, PND, palpitations, dizziness or syncope.   Pt denies polydipsia, polyuria, or new focal neuro s/s.    Pt denies recent wt loss, night sweats, loss of appetite, or other constitutional symptoms   Has low sugars 80s with 40 units of lantus  in July, now improved with reduced insulin  to 30 units but often still too high > 200.     Wt Readings from Last 3 Encounters:  09/27/23 186 lb 6.4 oz (84.6 kg)  09/20/23 183 lb (83 kg)  09/07/23 185 lb 6.4 oz (84.1 kg)   BP Readings from Last 3 Encounters:  09/27/23 130/62  09/20/23 (!) 115/51  09/07/23 (!) 150/56         Past Medical History:  Diagnosis Date   ALLERGIC RHINITIS 06/24/2007   Anemia    AVM (arteriovenous malformation) of colon    Blood transfusion without reported diagnosis 05/2015   COLONIC POLYPS, HX OF 06/24/2007   Coronary artery disease, non-occlusive 05/2015   Trop + w/ Acute Anemia =>CATH: small RI - Ostial 60%, ostial RCA 30% and dLAD 40-50%;; 9/'21: Cor Ca Score 624. Mild (25-49%) prox RCA & LAD,; Moderate (50-69%) Ostial Small RI & prox LCx.     DEPRESSION 10/08/2006   DIABETES MELLITUS, TYPE II 10/05/2006   GERD 06/24/2007   History of CVA (cerebrovascular accident) 08/10/2020   08/2020- found to be in afib with RVR, started on Eliquis  (shower of emboli from A. fib as complication of severe AS)   HYPERLIPIDEMIA 10/08/2006   HYPERTENSION 10/08/2006   INSOMNIA 09/24/2008   Left knee DJD    Nonsustained paroxysmal ventricular tachycardia (HCC) 10/2018   Post-TAVR Zio Patch: Frequent (206) runs of NSVT - Fastest 5 beats 239 bpm. Longest 9.3 Sec  avg 135 bpm. (? possibly Afib w/ aberrancy)   Osteoporosis 03/10/2016   PAF (paroxysmal atrial fibrillation) (HCC)    PAF with RVR: CHA2DS2-VASc 9.  On Eliquis  and amiodarone . 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 Verlin & Dr. Lucas   Severe aortic stenosis 08/2020   Progression from Mod-Severe AS to Severe AS - noted on Echo 08/2020 -> (mean gradient progressed from 34 to 42 mmHg) this was in setting of new onset A. fib RVR, acute diastolic HF and shower of emboli CVA. ->  Referred for TAVR, completed 10/26/2020   Past Surgical History:  Procedure Laterality Date   APPENDECTOMY     BIOPSY  10/21/2020   Procedure: BIOPSY;  Surgeon: Federico Rosario BROCKS, MD;  Location: [facility] ENDOSCOPY;  Service: Gastroenterology;;   BREAST BIOPSY     CARDIAC CATHETERIZATION N/A 05/21/2015   Procedure: Left Heart Cath and Coronary Angiography;  Surgeon: Alm LELON Clay, MD;  Location: [facility] INVASIVE CV LAB;  Service: Cardiovascular;: Ost RI 60%, Ost RCA 30%, dLAD tapers to small vessel w/ 40-50%. Mildly elevated LVEDP. Normal LV Fxn.   COLONOSCOPY N/A 05/20/2015   Procedure: COLONOSCOPY;  Surgeon: Elspeth Deward Naval, MD;  Location: [facility] ENDOSCOPY;  Service: Gastroenterology;  Laterality: N/A;   CORONARY CA2+ SCORE / CARDIAC CT ANGIOGRAM  10/09/2019   Calcium  score 624.  82nd percentile. Dominant RCA: Mild (25-49%) proximal stenosis-distal bifurcation into PDA and PAV--< RPL branches.  LAD (1 major mid vessel diagonal) diffuse calcified plaque, mild proximal stenosis with minimal distal stenosis.  Small RI moderate ostial disease.  LCx-moderate mixed (50-69%) proximal stenosis.  Small dOM1 disease.  Trileaflet AoV, annular Ca2+ - probable AS   ESOPHAGOGASTRODUODENOSCOPY N/A 05/20/2015   Procedure: ESOPHAGOGASTRODUODENOSCOPY (EGD);  Surgeon: Elspeth Deward Naval, MD;  Location: [facility]  ENDOSCOPY;  Service: Gastroenterology;  Laterality: N/A;   ESOPHAGOGASTRODUODENOSCOPY (EGD) WITH PROPOFOL  N/A 10/21/2020   Procedure: ESOPHAGOGASTRODUODENOSCOPY (EGD) WITH PROPOFOL ;  Surgeon: Federico Rosario BROCKS, MD;  Location: [facility] ENDOSCOPY;  Service: Gastroenterology;  Laterality: N/A;   INTRAOPERATIVE TRANSTHORACIC ECHOCARDIOGRAM N/A 10/26/2020   Procedure: INTRAOPERATIVE TRANSTHORACIC ECHOCARDIOGRAM;  Surgeon: Verlin; Location: [facility] OR; Pre-TAVR well-visualized calcified AoV mean Grad 37 mmHg, AVA 0.72 cm => Post TAVR well-positioned supra-annular 26 mm Medtronic Evolut Pro valve placed with no PVL.  Mean gradient 14 mmHg.  AVR 1.58 cm.  Normal flow to RCA and LM-LAD.  ; EF 60 to 65%.  Degenerative severe MAC w/ mild MR.   RIGHT/LEFT HEART CATH AND CORONARY ANGIOGRAPHY N/A 09/29/2020   Procedure: RIGHT/LEFT HEART CATH AND CORONARY ANGIOGRAPHY;  Surgeon: Verlin Lonni BIRCH, MD;  Location: [facility] INVASIVE CV LAB;  Service: Cardiovascular; pre-TAVR:  pRCA 20%, m-dRCA 30%. D LM-pLAD 20%. RI 30%, mLAD 20%.  Severe AS with mean gradient measured at 52.8 mmHg.   TONSILLECTOMY     TRANSCATHETER AORTIC VALVE REPLACEMENT, TRANSFEMORAL N/A 10/26/2020   Procedure: TRANSCATHETER AORTIC VALVE REPLACEMENT, TRANSFEMORAL;  Surgeon: Verlin Lonni BIRCH, MD;  Location: [facility] OR;  Service: Open Heart Surgery;; Medtronic Evolut-Pro + (size 26 mm, model # EVPROPLUS -26US, serial # [id number]); transient high-grade AV block noted so PPM left in place.   TRANSTHORACIC ECHOCARDIOGRAM  03/17/2019   a) 03/2019: EF 60 to 65%.  Moderate LVH.  GRII DD.  Mod-Severe AS (m grad 36 mmHg, peak 59 mmHg); b) 09/2019: EF 65-70%, No RWMA. Gr1 DD/hi LAP, Mild hi PAP. Mod LA Dil. MOD AS (mean Grad 34.5 mmHg).  STABLE   TRANSTHORACIC ECHOCARDIOGRAM  08/27/2020   Admitted for CVA/Afib RVR & CHF (Progression to SEVERE SYMPTOMATIC AS):  Severe Calcific Aortic Stenosis (VTI AVA estimated 0.86 cm, mean gradient 42 mmHg, V-max 4.36 m/s).  EF 55 to 60%.   Severe concentric LVH.  Unable to determine diastolic parameters.  Moderately elevated PAP.  Mild LA dilation.  Mild circumferential pericardial effusion.  Trivial MR.  Severe MAC.   TRANSTHORACIC ECHOCARDIOGRAM  11/25/2020   1 Month s/p TAVR: EF 55 to 60%.  No R WMA.  GR 1 DD.  Elevated LVEDP.  Normal RV size with mildly elevated RVP estimated 44 mmHg.  Oscillating density in the RV suspect calcified chordal apparatus versus calcified thrombus.  Moderate LA dilation.  Normal IVC/RA P => Well-positioned 26 mm Medtronic Evolut Pro THVwith mean AOV gradient 11 mmHg.  Trivial PVL   TRANSTHORACIC ECHOCARDIOGRAM  11/02/2020   a) Day 1 Post-op TAVR 10/27/20:  Well-Positioned Supra Annular Medtronic Evolut Pro THP.  Mean gradient 12 mmHg, peak 24 mmHg.  AVA 1.8 cm.  Trivial PVL.  EF 60 to 65%.  Normal LV function.  Mild LVH.  Severe LA dilation.  Mild RA dilation.  Severe MAC;; b) 11/02/20: Normal structure  and function of the aortic valve prosthesis.  Mean gradient 9 mmHg (otherwise stable)   TUBAL LIGATION      reports that she has never smoked. She has been exposed to tobacco smoke. She has never used smokeless tobacco. She reports that she does not drink alcohol and does not use drugs. family history includes Alcohol abuse in an other family member; Arrhythmia in her sister; Arthritis in an other family member; CAD in her sister; Cancer in her mother; Depression in an other family member; Diabetes in an other family member; Heart attack (age of onset: 52) in her sister; Heart disease in an other family member; Hyperlipidemia in an other family member; Hypertension in an other family member; Stroke in an other family member. Allergies  Allergen Reactions   Ibuprofen Other (See Comments)    Bleeding events   Amiodarone  Other (See Comments)    Lung toxicity   Current Outpatient Medications on File Prior to Visit  Medication Sig Dispense Refill   acetaminophen  (TYLENOL ) 500 MG tablet Take 500 mg by mouth  every 6 (six) hours as needed for moderate pain.     albuterol  (VENTOLIN  HFA) 108 (90 Base) MCG/ACT inhaler Inhale 1-2 puffs into the lungs every 6 (six) hours as needed for wheezing or shortness of breath. 8 g 2   ALPRAZolam  (XANAX ) 0.25 MG tablet TAKE 1 TABLET BY MOUTH 3 TIMES DAILY AS NEEDED FOR ANXIETY. 90 tablet 2   atorvastatin  (LIPITOR ) 40 MG tablet TAKE 1 TABLET BY MOUTH EVERY DAY 90 tablet 3   CVS VITAMIN B12 1000 MCG tablet TAKE 1 TABLET BY MOUTH EVERY DAY 90 tablet 3   glucose blood (ACCU-CHEK GUIDE TEST) test strip Use as instructed twice per day E11.9 200 each 12   hydrALAZINE  (APRESOLINE ) 100 MG tablet Take 1 tablet (100 mg total) by mouth 3 (three) times daily. 270 tablet 3   insulin  glargine-yfgn (SEMGLEE ) 100 UNIT/ML Pen SMARTSIG:30 Unit(s) SUB-Q Daily     Insulin  Pen Needle (TECHLITE PEN NEEDLES) 32G X 4 MM MISC Use as directed once daily with insulin  shot 100 each 3   isosorbide  mononitrate (IMDUR ) 60 MG 24 hr tablet Take 1 tablet (60 mg total) by mouth daily. 90 tablet 3   levothyroxine  (SYNTHROID ) 25 MCG tablet TAKE 1 TABLET BY MOUTH EVERY DAY 90 tablet 2   metoprolol  succinate (TOPROL -XL) 25 MG 24 hr tablet Take 0.5 tablets (12.5 mg total) by mouth daily. 45 tablet 3   Miconazole Nitrate (MICONAZOLE 3 COMBO PACK VA) Place 1 application  vaginally at bedtime.     mirtazapine  (REMERON ) 15 MG tablet TAKE 1 TABLET BY MOUTH EVERYDAY AT BEDTIME 90 tablet 3   pantoprazole  (PROTONIX ) 40 MG tablet TAKE 1 TABLET BY MOUTH TWICE A DAY 180 tablet 1   solifenacin  (VESICARE ) 5 MG tablet Take 1 tablet (5 mg total) by mouth daily. 90 tablet 3   torsemide  (DEMADEX ) 20 MG tablet Take 2 tablets (40 mg total) by mouth daily. 180 tablet 3   No current facility-administered medications on file prior to visit.        ROS:  All others reviewed and negative.  Objective        PE:  BP 130/62   Pulse 66   Temp 98.7 F (37.1 C) (Oral)   Ht 5' 1 (1.549 m)   Wt 186 lb 6.4 oz (84.6 kg)   SpO2  91%   BMI 35.22 kg/m  Constitutional: Pt appears in NAD               HENT: Head: NCAT.                Right Ear: External ear normal.                 Left Ear: External ear normal.                Eyes: . Pupils are equal, round, and reactive to light. Conjunctivae and EOM are normal               Nose: without d/c or deformity               Neck: Neck supple. Gross normal ROM               Cardiovascular: Normal rate and regular rhythm.                 Pulmonary/Chest: Effort normal and breath sounds without rales or wheezing.                Abd:  Soft, NT, ND, + BS, no organomegaly               Neurological: Pt is alert. At baseline orientation, motor grossly intact               Skin: Skin is warm. No rashes, no other new lesions, LE edema - trace to 1+ bilat to knees left > right               Psychiatric: Pt behavior is normal without agitation   Micro: none  Cardiac tracings I have personally interpreted today:  none  Pertinent Radiological findings (summarize): none   Lab Results  Component Value Date   WBC 8.0 07/17/2023   HGB 8.8 (L) 07/17/2023   HCT 26.0 (L) 07/17/2023   PLT 65 (L) 07/17/2023   GLUCOSE 194 (H) 07/19/2023   CHOL 102 03/14/2023   TRIG 102.0 03/14/2023   HDL 30.10 (L) 03/14/2023   LDLCALC 51 03/14/2023   ALT 28 07/19/2023   AST 15 07/19/2023   NA 139 07/19/2023   K 3.5 07/19/2023   CL 99 07/19/2023   CREATININE 1.77 (H) 07/19/2023   BUN 43 (H) 07/19/2023   CO2 32 07/19/2023   TSH 5.654 (H) 05/14/2023   INR 1.7 (H) 11/01/2020   HGBA1C 7.9 (H) 07/19/2023   MICROALBUR 3.5 (H) 12/07/2009   Assessment/Plan:  Mary Mccarthy is a 83 y.o. White or Caucasian [1] female with  has a past medical history of ALLERGIC RHINITIS (06/24/2007), Anemia, AVM (arteriovenous malformation) of colon, Blood transfusion without reported diagnosis (05/2015), COLONIC POLYPS, HX OF (06/24/2007), Coronary artery disease, non-occlusive (05/2015),  DEPRESSION (10/08/2006), DIABETES MELLITUS, TYPE II (10/05/2006), GERD (06/24/2007), History of CVA (cerebrovascular accident) (08/10/2020), HYPERLIPIDEMIA (10/08/2006), HYPERTENSION (10/08/2006), INSOMNIA (09/24/2008), Left knee DJD, Nonsustained paroxysmal ventricular tachycardia (HCC) (10/2018), Osteoporosis (03/10/2016), PAF (paroxysmal atrial fibrillation) (HCC), PAF with RVR: CHA2DS2-VASc 9.  On Eliquis  and amiodarone . (08/11/2020), PEPTIC ULCER DISEASE (10/08/2006), S/P TAVR (transcatheter aortic valve replacement) (10/26/2020), and Severe aortic stenosis (08/2020).  CHF (congestive heart failure), NYHA class II, chronic, diastolic (HCC) With very mild increased swelling, ok for increased torsemide  to 60 mg daily for 2 days, then resume 40 mg qd  CKD (chronic kidney disease), stage III (HCC) Lab Results  Component Value Date   CREATININE 1.77 (H) 07/19/2023   Stable overall, cont to avoid  nephrotoxins   Essential hypertension BP Readings from Last 3 Encounters:  09/27/23 130/62  09/20/23 (!) 115/51  09/07/23 (!) 150/56   Stable, pt to continue medical treatment hdyralazinee 100 tid, toprol  xl 12.5 qd   Type 2 diabetes mellitus with hyperlipidemia (HCC) Lab Results  Component Value Date   HGBA1C 7.9 (H) 07/19/2023   Uncontrolled, but pt had frequent low sugars with 40 units lantus , and now too high often > 200 on 30 units, so ok to take 35 u sq every day,  Followup: Return in about 3 months (around 12/28/2023).  Lynwood Rush, MD 09/27/2023 7:36 PM [facility] - [facility]

## 2023-09-27 NOTE — Assessment & Plan Note (Signed)
 With very mild increased swelling, ok for increased torsemide  to 60 mg daily for 2 days, then resume 40 mg qd

## 2023-09-27 NOTE — Assessment & Plan Note (Signed)
 Lab Results  Component Value Date   CREATININE 1.77 (H) 07/19/2023   Stable overall, cont to avoid nephrotoxins

## 2023-10-01 DIAGNOSIS — J9610 Chronic respiratory failure, unspecified whether with hypoxia or hypercapnia: Secondary | ICD-10-CM | POA: Diagnosis not present

## 2023-10-09 ENCOUNTER — Other Ambulatory Visit: Payer: Self-pay

## 2023-10-09 NOTE — Patient Instructions (Signed)
 Visit Information  Thank you for taking time to visit with me today. Please don't hesitate to contact me if I can be of assistance to you before our next scheduled appointment.  Your next care management appointment is by telephone on 11/07/23 at 1:00 pm  Please call the care guide team at [phone] if you need to cancel, schedule, or reschedule an appointment.   Please call the Suicide and Crisis Lifeline: 988 call the [place]  National Suicide Prevention Lifeline: 231-069-5643 or TTY: 3237283114 TTY 570-655-0039) to talk to a trained counselor call 1-800-273-TALK (toll free, 24 hour hotline) if you are experiencing a Mental Health or Behavioral Health Crisis or need someone to talk to.  Heddy Shutter, RN, MSN, BSN, CCM [place]  [facility], Population Health Case Manager Phone: [phone]

## 2023-10-09 NOTE — Patient Outreach (Signed)
 Complex Care Management   Visit Note  10/09/2023  Name:  Mary Mccarthy MRN: [id number] DOB: Aug 14, 1940  Situation: Referral received for Complex Care Management related to Heart Failure I obtained verbal consent from Patient.  Visit completed with Patient  on the phone  Background:   Past Medical History:  Diagnosis Date   ALLERGIC RHINITIS 06/24/2007   Anemia    AVM (arteriovenous malformation) of colon    Blood transfusion without reported diagnosis 05/2015   COLONIC POLYPS, HX OF 06/24/2007   Coronary artery disease, non-occlusive 05/2015   Trop + w/ Acute Anemia =>CATH: small RI - Ostial 60%, ostial RCA 30% and dLAD 40-50%;; 9/'21: Cor Ca Score 624. Mild (25-49%) prox RCA & LAD,; Moderate (50-69%) Ostial Small RI & prox LCx.     DEPRESSION 10/08/2006   DIABETES MELLITUS, TYPE II 10/05/2006   GERD 06/24/2007   History of CVA (cerebrovascular accident) 08/10/2020   08/2020- found to be in afib with RVR, started on Eliquis  (shower of emboli from A. fib as complication of severe AS)   HYPERLIPIDEMIA 10/08/2006   HYPERTENSION 10/08/2006   INSOMNIA 09/24/2008   Left knee DJD    Nonsustained paroxysmal ventricular tachycardia (HCC) 10/2018   Post-TAVR Zio Patch: Frequent (206) runs of NSVT - Fastest 5 beats 239 bpm. Longest 9.3 Sec avg 135 bpm. (? possibly Afib w/ aberrancy)   Osteoporosis 03/10/2016   PAF (paroxysmal atrial fibrillation) (HCC)    PAF with RVR: CHA2DS2-VASc 9.  On Eliquis  and amiodarone . 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 Verlin & Dr. Lucas   Severe aortic stenosis 08/2020   Progression from Mod-Severe AS to Severe AS - noted on Echo 08/2020 -> (mean gradient progressed from 34 to 42 mmHg) this was in setting of new onset A. fib RVR, acute diastolic HF and shower of emboli CVA. ->  Referred for TAVR, completed  10/26/2020   Assessment: Patient reports, I feel fine. I am doing good. Patient states she continues to weigh self daily and states weights range 184-185 lb. Patient states reports her feet a little swollen. She denies SOB. Last office visit with PCP completed on 09/27/23. She reports she followed PCP recommendations/instructions to take additional 20 mg of torsemide  per PCP instructions. Patient states she lost a lot of weight during last hospitalization, but is not able to get back down to that weight. Patient reports an upcoming appointment with cardiology on 10/15/23. Patient Reported Symptoms:  Cognitive Cognitive Status: No symptoms reported, Alert and oriented to person, place, and time      Neurological Neurological Review of Symptoms: No symptoms reported    HEENT HEENT Symptoms Reported: No symptoms reported      Cardiovascular Cardiovascular Symptoms Reported: Swelling in legs or feet (patient reports feet swollen sometimes worse than others. she states today feet are swollen a little and adds she has been up and down a lot today.) Does patient have uncontrolled Hypertension?: Yes Is patient checking Blood Pressure at home?: Yes Patient's Recent BP reading at home: patient reports BP this moring 166/60 HR 67 today before medications(around (am this morning). BP rechecked around 1:30pm and down to 124/50 HR 72    Respiratory Respiratory Symptoms Reported: No symptoms reported    Endocrine Endocrine Symptoms Reported: No symptoms reported Is patient diabetic?: Yes Is patient checking blood sugars at home?:  Yes List most recent blood sugar readings, include date and time of day: BS 187 today. pcp seen on 09/27/23 and lantus  decreased to 35 units. patient continues to check BS and has follow up in 3 months.    Gastrointestinal Gastrointestinal Symptoms Reported: No symptoms reported      Genitourinary Genitourinary Symptoms Reported: No symptoms reported    Integumentary  Integumentary Symptoms Reported: No symptoms reported    Musculoskeletal Musculoskelatal Symptoms Reviewed: No symptoms reported        Psychosocial Psychosocial Symptoms Reported: No symptoms reported          Vitals:   10/09/23 1336 10/09/23 1627  BP: (!) 166/60 (!) 124/50  Pulse: 67 72    Medications Reviewed Today     Reviewed by Liandro Thelin M, RN (Registered Nurse) on 10/09/23 at 1319  Med List Status: <None>   Medication Order Taking? Sig Documenting Provider Last Dose Status Informant  acetaminophen  (TYLENOL ) 500 MG tablet [id number] Yes Take 500 mg by mouth every 6 (six) hours as needed for moderate pain. [provider]  Active Self, Child, Pharmacy Records           Med Note ROLENE RODRIGUEZ, [name]   Mon May 14, 2023  2:52 PM)    albuterol  (VENTOLIN  HFA) 108 [phone] Base) MCG/ACT inhaler [id number] Yes Inhale 1-2 puffs into the lungs every 6 (six) hours as needed for wheezing or shortness of breath. Cobb, Comer GAILS, NP  Active   ALPRAZolam  (XANAX ) 0.25 MG tablet [id number] Yes TAKE 1 TABLET BY MOUTH 3 TIMES DAILY AS NEEDED FOR ANXIETY. [name], MD  Active   atorvastatin  (LIPITOR ) 40 MG tablet [id number] Yes TAKE 1 TABLET BY MOUTH EVERY DAY [name], MD  Active Self, Child, Pharmacy Records  CVS VITAMIN B12 1000 MCG tablet [id number] Yes TAKE 1 TABLET BY MOUTH EVERY DAY [name], MD  Active Self, Child, Pharmacy Records  glucose blood (ACCU-CHEK GUIDE TEST) test strip [id number]  Use as instructed twice per day E11.9 [name], MD  Active   hydrALAZINE  (APRESOLINE ) 100 MG tablet [id number] Yes Take 1 tablet (100 mg total) by mouth 3 (three) times daily. [name], MD  Active   insulin  glargine (LANTUS  SOLOSTAR) 100 UNIT/ML Solostar Pen [id number] Yes Inject 35 Units into the skin daily. [name], MD  Active   insulin  glargine-yfgn (SEMGLEE ) 100 UNIT/ML Pen [id number]  SMARTSIG:30 Unit(s) SUB-Q Daily [provider]  Active    Insulin  Pen Needle (TECHLITE PEN NEEDLES) 32G X 4 MM MISC [id number]  Use as directed once daily with insulin  shot John, James W, MD  Active   isosorbide  mononitrate (IMDUR ) 60 MG 24 hr tablet [id number] Yes Take 1 tablet (60 mg total) by mouth daily. [name], MD  Active   levothyroxine  (SYNTHROID ) 25 MCG tablet [id number] Yes TAKE 1 TABLET BY MOUTH EVERY DAY [name], MD  Active Self, Child, Pharmacy Records  metoprolol  succinate (TOPROL -XL) 25 MG 24 hr tablet [id number] Yes Take 0.5 tablets (12.5 mg total) by mouth daily. [name], MD  Active   Miconazole Nitrate (MICONAZOLE 3 COMBO PACK VA) [id number]  Place 1 application  vaginally at bedtime.  Patient not taking: Reported on 10/09/2023   [provider]  Active   mirtazapine  (REMERON ) 15 MG tablet [id number] Yes TAKE 1 TABLET BY MOUTH EVERYDAY AT BEDTIME [name], MD  Active Self, Child, Pharmacy Records  Med Note ROLENE RODRIGUEZ, [name]   Mon May 14, 2023  3:12 PM) Pt adamant of taking medication. Dispense report does not support claim.  pantoprazole  (PROTONIX ) 40 MG tablet [id number] Yes TAKE 1 TABLET BY MOUTH TWICE A DAY Sebastian Lamarr SAUNDERS, PA-C  Active Self, Child, Pharmacy Records  solifenacin  (VESICARE ) 5 MG tablet [id number] Yes Take 1 tablet (5 mg total) by mouth daily. [name], MD  Active Self, Child, Pharmacy Records  torsemide  (DEMADEX ) 20 MG tablet [id number] Yes Take 2 tablets (40 mg total) by mouth daily. [name], MD  Active           Recommendation:   Specialty provider follow-up Cardiology 10/15/23 Continue Current Plan of Care  Follow Up Plan:   Telephone follow up appointment date/time:  11/07/23 at 1:00 pm  Heddy Shutter, RN, MSN, BSN, CCM [place]  [facility], Population Health Case Manager Phone: [phone]

## 2023-10-15 ENCOUNTER — Encounter: Payer: Self-pay | Admitting: Cardiology

## 2023-10-15 ENCOUNTER — Ambulatory Visit: Attending: Cardiology | Admitting: Cardiology

## 2023-10-15 VITALS — BP 126/48 | HR 69 | Ht 61.0 in | Wt 184.4 lb

## 2023-10-15 DIAGNOSIS — I2510 Atherosclerotic heart disease of native coronary artery without angina pectoris: Secondary | ICD-10-CM

## 2023-10-15 DIAGNOSIS — E785 Hyperlipidemia, unspecified: Secondary | ICD-10-CM | POA: Diagnosis not present

## 2023-10-15 DIAGNOSIS — D6869 Other thrombophilia: Secondary | ICD-10-CM | POA: Diagnosis not present

## 2023-10-15 DIAGNOSIS — I5032 Chronic diastolic (congestive) heart failure: Secondary | ICD-10-CM | POA: Diagnosis not present

## 2023-10-15 DIAGNOSIS — I272 Pulmonary hypertension, unspecified: Secondary | ICD-10-CM

## 2023-10-15 DIAGNOSIS — I4811 Longstanding persistent atrial fibrillation: Secondary | ICD-10-CM

## 2023-10-15 DIAGNOSIS — I10 Essential (primary) hypertension: Secondary | ICD-10-CM | POA: Diagnosis not present

## 2023-10-15 DIAGNOSIS — Z952 Presence of prosthetic heart valve: Secondary | ICD-10-CM | POA: Diagnosis not present

## 2023-10-15 DIAGNOSIS — G4730 Sleep apnea, unspecified: Secondary | ICD-10-CM | POA: Diagnosis not present

## 2023-10-15 DIAGNOSIS — E1169 Type 2 diabetes mellitus with other specified complication: Secondary | ICD-10-CM | POA: Diagnosis not present

## 2023-10-15 DIAGNOSIS — R0902 Hypoxemia: Secondary | ICD-10-CM | POA: Diagnosis not present

## 2023-10-15 DIAGNOSIS — I82819 Embolism and thrombosis of superficial veins of unspecified lower extremities: Secondary | ICD-10-CM | POA: Diagnosis not present

## 2023-10-15 NOTE — Progress Notes (Unsigned)
 Cardiology Office Note:  .   Date:  10/15/2023  ID:  Mary Mccarthy 1940/12/29, MRN [id number] PCP: [name], MD  [facility] Providers Cardiologist:  Alm Clay, MD Electrophysiologist:  Jerel Balding, MD { Click to update primary MD,subspecialty MD or APP then REFRESH:1}    No chief complaint on file.   Patient Profile: .     Mary Mccarthy is a *** 83 y.o. female *** with a PMH notable for *** who presents here for *** at the request of [name], MD.  Mary Mccarthy is a chronically ill 83 y.o. female  with a PMH notable for Chronic Hypoxic Respiratory Failure (on oxygen ), Chronic HFpEF (diastolic CHF-related to longstanding AS), s/p TAVR for Severe AS, Persistent A-Fib, HTN, HLD, DM-2, chronic anemia (h/o GI Bleed 2/2 PUD/AVM) & hypothyroidism who presents here for 2-week follow-up at the request of Artist Pouch, PA.  She continues to return here today at the request [name], MD.  PMH:  Chronic Respiratory Failure on Oxygen  at Baseline Chronic Diastolic Heart Failure Now complicated by concern for ILD potentially related to amiodarone   H/o Severe AS s/p TAVR (26 mm Medtronic Evolut Pro+ THV via TF approach in 10/2020) mild CAD by pre-TAVR cath 09/2020;  Persistent A. Fib not on chronic AC due to recurrent GI bleed; history of CVA,);  No longer on amiodarone -for fear of amiodarone  related ILD H/o CVA HTN , HLD, , type 2 diabetes, hypothyroidism Chronic Anemia:  History of PUD/AVM =>  previously requiring transfusions ; no longer on DOAC GERD    [place] Novak was last seen on ***  Subjective  Discussed the use of AI scribe software for clinical note transcription with the patient, who gave verbal consent to proceed.  History of Present Illness      Cardiovascular ROS: {roscv:310661}  ROS:  Review of Systems - {ros master:310782}    Objective    Studies Reviewed: SABRA        Lab Results   Component Value Date   NA 139 07/19/2023   K 3.5 07/19/2023   CREATININE 1.77 (H) 07/19/2023   GFR 26.02 (L) 07/19/2023   GLUCOSE 194 (H) 07/19/2023   Lab Results  Component Value Date   CHOL 102 03/14/2023   HDL 30.10 (L) 03/14/2023   LDLCALC 51 03/14/2023   TRIG 102.0 03/14/2023   CHOLHDL 3 03/14/2023   Lab Results  Component Value Date   WBC 8.0 07/17/2023   HGB 8.8 (L) 07/17/2023   HCT 26.0 (L) 07/17/2023   MCV 99.7 07/17/2023   PLT 65 (L) 07/17/2023    Results  ECHO: *** CATH: *** MONITOR: *** CT: ***  Risk Assessment/Calculations:   {Does this patient have ATRIAL FIBRILLATION?[phone]}           Physical Exam:   VS:  BP (!) 126/48   Pulse 69   Ht 5' 1 (1.549 m)   Wt 184 lb 6.4 oz (83.6 kg)   SpO2 94%   BMI 34.84 kg/m    Wt Readings from Last 3 Encounters:  10/15/23 184 lb 6.4 oz (83.6 kg)  09/27/23 186 lb 6.4 oz (84.6 kg)  09/20/23 183 lb (83 kg)    Physical Exam    GEN: Well nourished, well developed in no acute distress; *** NECK: No JVD; No carotid bruits CARDIAC: Normal S1, S2; RRR, no murmurs, rubs, gallops RESPIRATORY:  Clear to auscultation without rales, wheezing or rhonchi ; nonlabored, good  air movement. ABDOMEN: Soft, non-tender, non-distended EXTREMITIES:  No edema; No deformity      ASSESSMENT AND PLAN: .    Problem List Items Addressed This Visit       Cardiology Problems   CHF (congestive heart failure), NYHA class II, chronic, diastolic (HCC) - Primary (Chronic)   Coronary artery disease, non-occlusive (Chronic)   Essential hypertension (Chronic)   Hyperlipidemia associated with type 2 diabetes mellitus (HCC) (Chronic)   Longstanding persistent atrial fibrillation (HCC) (Chronic)   Pulmonary hypertension (HCC) (Chronic)     Other   Hypercoagulable state due to atrial fibrillation (HCC) (Chronic)   S/P TAVR (transcatheter aortic valve replacement) (Chronic)    Assessment and Plan Assessment & Plan         {Are you ordering a CV Procedure (e.g. stress test, cath, DCCV, TEE, etc)?   Press F2        :[id number]}   Follow-Up: No follow-ups on file.  I spent *** minutes in the care of Mary Stewart Kerns today including {CHL AMB CAR Time Based Billing Options STW (Optional):[phone]::documenting in the encounter.}      Signed, Alm MICAEL Clay, MD, MS Alm Clay, M.D., M.S. Interventional Cardiologist  [facility] Pager # [phone]

## 2023-10-15 NOTE — Patient Instructions (Signed)
 Medication Instructions:   No changes  *If you need a refill on your cardiac medications before your next appointment, please call your pharmacy*   Lab Work: CMP CBC If you have labs (blood work) drawn today and your tests are completely normal, you will receive your results only by: MyChart Message (if you have MyChart) OR A paper copy in the mail If you have any lab test that is abnormal or we need to change your treatment, we will call you to review the results.   Testing/Procedures: Schedule in April 2026 Your physician has requested that you have an echocardiogram. Echocardiography is a painless test that uses sound waves to create images of your heart. It provides your doctor with information about the size and shape of your heart and how well your heart's chambers and valves are working. This procedure takes approximately one hour. There are no restrictions for this procedure. Please do NOT wear cologne, perfume, aftershave, or lotions (deodorant is allowed). Please arrive 15 minutes prior to your appointment time.  Please note: We ask at that you not bring children with you during ultrasound (echo/ vascular) testing. Due to room size and safety concerns, children are not allowed in the ultrasound rooms during exams. Our front office staff cannot provide observation of children in our lobby area while testing is being conducted. An adult accompanying a patient to their appointment will only be allowed in the ultrasound room at the discretion of the ultrasound technician under special circumstances. We apologize for any inconvenience.    Follow-Up: At [facility], you and your health needs are our priority.  As part of our continuing mission to provide you with exceptional heart care, we have created designated Provider Care Teams.  These Care Teams include your primary Cardiologist (physician) and Advanced Practice Providers (APPs -  Physician Assistants and Nurse Practitioners)  who all work together to provide you with the care you need, when you need it.     Your next appointment:   7 to 8 month(s)  The format for your next appointment:   In Person  Provider:   Alm Clay, MD   Other Instructions

## 2023-10-16 LAB — CBC
Hematocrit: 31.3 % — ABNORMAL LOW (ref 34.0–46.6)
Hemoglobin: 10.1 g/dL — ABNORMAL LOW (ref 11.1–15.9)
MCH: 31.4 pg (ref 26.6–33.0)
MCHC: 32.3 g/dL (ref 31.5–35.7)
MCV: 97 fL (ref 79–97)
Platelets: 155 x10E3/uL (ref 150–450)
RBC: 3.22 x10E6/uL — ABNORMAL LOW (ref 3.77–5.28)
RDW: 12.9 % (ref 11.7–15.4)
WBC: 5.3 x10E3/uL (ref 3.4–10.8)

## 2023-10-16 LAB — COMPREHENSIVE METABOLIC PANEL WITH GFR
ALT: 12 IU/L (ref 0–32)
AST: 20 IU/L (ref 0–40)
Albumin: 3.6 g/dL — ABNORMAL LOW (ref 3.7–4.7)
Alkaline Phosphatase: 84 IU/L (ref 44–121)
BUN/Creatinine Ratio: 18 (ref 12–28)
BUN: 41 mg/dL — ABNORMAL HIGH (ref 8–27)
Bilirubin Total: 0.5 mg/dL (ref 0.0–1.2)
CO2: 29 mmol/L (ref 20–29)
Calcium: 9.9 mg/dL (ref 8.7–10.3)
Chloride: 104 mmol/L (ref 96–106)
Creatinine, Ser: 2.24 mg/dL — ABNORMAL HIGH (ref 0.57–1.00)
Globulin, Total: 3.2 g/dL (ref 1.5–4.5)
Glucose: 82 mg/dL (ref 70–99)
Potassium: 4.1 mmol/L (ref 3.5–5.2)
Sodium: 142 mmol/L (ref 134–144)
Total Protein: 6.8 g/dL (ref 6.0–8.5)
eGFR: 21 mL/min/1.73 — ABNORMAL LOW (ref >59)

## 2023-10-17 DIAGNOSIS — I639 Cerebral infarction, unspecified: Secondary | ICD-10-CM | POA: Diagnosis not present

## 2023-10-17 DIAGNOSIS — R2681 Unsteadiness on feet: Secondary | ICD-10-CM | POA: Diagnosis not present

## 2023-10-17 DIAGNOSIS — R269 Unspecified abnormalities of gait and mobility: Secondary | ICD-10-CM | POA: Diagnosis not present

## 2023-10-18 ENCOUNTER — Ambulatory Visit: Payer: Self-pay | Admitting: Cardiology

## 2023-10-20 ENCOUNTER — Encounter: Payer: Self-pay | Admitting: Cardiology

## 2023-10-20 DIAGNOSIS — I82819 Embolism and thrombosis of superficial veins of unspecified lower extremities: Secondary | ICD-10-CM | POA: Insufficient documentation

## 2023-10-20 NOTE — Assessment & Plan Note (Signed)
 Weight improved.  She lost weight during her illness in postoperative stage.  Doing well.  Eating more healthy.  Weight is now stable.

## 2023-10-20 NOTE — Assessment & Plan Note (Signed)
 History of TAVR.  Last echo was in April of this year.  Will be due for follow-up Echo in April 2023.  - Follow-up echo ordered -SBE prophylaxis discussed

## 2023-10-20 NOTE — Assessment & Plan Note (Signed)
 Blood pressure well-controlled on hydralazine  100 mg 3 times daily plus Imdur  60 mL daily and Toprol  12.5 mg daily.  This is in conjunction with her torsemide  dosing for her CHF -No change

## 2023-10-20 NOTE — Assessment & Plan Note (Addendum)
 Superficial vein thrombosis in the left small saphenous vein of the lower extremity. No deep vein thrombosis detected. - Continue with warm compresses and leg elevation.

## 2023-10-20 NOTE — Assessment & Plan Note (Signed)
 Atherosclerotic heart disease, status post TAVR. TAVR valve well-positioned with a mean gradient of 9. - Continue current management and monitoring: Using hydralazine /Imdur  for afterload reduction due to renal insufficiency.  This is along with Toprol -XL for rate control.  She is on 40 mg atorvastatin .  For reasons discussed not on aspirin  or DOAC.

## 2023-10-20 NOTE — Assessment & Plan Note (Signed)
 Most recent creatinine 1.77. Chronic kidney disease with fluctuating creatinine levels. Recent improvement in kidney function. - Order blood chemistry to monitor kidney function.

## 2023-10-20 NOTE — Assessment & Plan Note (Signed)
 Pulmonary pressures were elevated on echo and April.  She seems to be relatively stable and asymptomatic.  Will continue to monitor on follow-up echocardiograms.  Treat her left-sided diastolic failure with medicine as noted above.  If pressures continue be elevated we can consider right heart cath but for now would hold off.

## 2023-10-20 NOTE — Assessment & Plan Note (Signed)
 Excellent lipid control on 40 mg atorvastatin  with LDL 51 by last check in February. - Continue current dose of atorvastatin   Type 2 diabetes mellitus managed with Lantus . Confusion regarding insulin  prescriptions, with both Lantus  and another insulin  provided. - Confirm insulin  regimen with primary care provider to ensure correct dosing.

## 2023-10-20 NOTE — Assessment & Plan Note (Signed)
 Chronic diastolic heart failure with volume management => she appears to be at what would be her dry weight now. Weight stable, blood pressure controlled, no significant edema, improved breathing, and increased energy levels. - Continue hydralazine  100 mg 3 times daily, Imdur  60 mg daily, metoprolol  succinate 12.5 mg daily, and Demodex 40 mg daily with PRN dosing as directed. - Monitor weight daily; adjust Demodex if weight increases by more than 3 lbs. - Order blood chemistry and CBC to monitor kidney function and blood counts.

## 2023-10-20 NOTE — Assessment & Plan Note (Signed)
 Amiodarone  stopped because of pulmonary toxicity.  Now on low-dose Toprol  for rate control.  She seems to be relatively controlled and asymptomatic whether she is or is not in A-fib.  As far she can tell us  she has not had breakthrough spells though. Not anticoagulated due to recurrent GI bleeding. Decision to withhold anticoagulation due to GI bleeding risk and potential stroke acknowledged. - Continue to withhold anticoagulation due to GI bleeding risk. - Continue low-dose Toprol  12.5 mg daily for rate control.

## 2023-10-22 ENCOUNTER — Other Ambulatory Visit: Payer: Self-pay | Admitting: Internal Medicine

## 2023-10-22 ENCOUNTER — Other Ambulatory Visit: Payer: Self-pay | Admitting: Cardiology

## 2023-10-22 DIAGNOSIS — E785 Hyperlipidemia, unspecified: Secondary | ICD-10-CM

## 2023-10-22 DIAGNOSIS — I2510 Atherosclerotic heart disease of native coronary artery without angina pectoris: Secondary | ICD-10-CM

## 2023-10-31 ENCOUNTER — Other Ambulatory Visit (HOSPITAL_COMMUNITY): Payer: Self-pay

## 2023-11-01 ENCOUNTER — Ambulatory Visit: Payer: Self-pay | Admitting: Nurse Practitioner

## 2023-11-01 DIAGNOSIS — J9610 Chronic respiratory failure, unspecified whether with hypoxia or hypercapnia: Secondary | ICD-10-CM | POA: Diagnosis not present

## 2023-11-01 NOTE — Progress Notes (Signed)
 ONO on 2 lpm supplemental O2 with significant oxygen  desaturations and majority of the time spent </88%. I am concerned she could have underlying untreated OSA. Can you please schedule virtual visit ASAP to discuss results and next steps? Thanks.

## 2023-11-02 ENCOUNTER — Telehealth: Payer: Self-pay

## 2023-11-02 NOTE — Telephone Encounter (Signed)
 Copied from CRM [id number]. Topic: Clinical - Order For Equipment >> Nov 01, 2023  3:30 PM Rilla B wrote: Reason for CRM: [employer] with patient on the line.  Patient would like a small oxygen  tank to carry around rather than the big one she is using.  Please any questions please patient .    Looks like her PCP was the one who placed original order Norleen Agent

## 2023-11-02 NOTE — Progress Notes (Signed)
 Called and spoke to pt, informed of results per [facility]. Booked appt for Fri. 10/10 @ 2pm virtual. Accidentally booked in person but Wilford said she would change it to virtual. NFN.

## 2023-11-07 ENCOUNTER — Other Ambulatory Visit

## 2023-11-07 NOTE — Patient Instructions (Addendum)
 Visit Information  Thank you for taking time to visit with me today. Please don't hesitate to contact me if I can be of assistance to you before our next scheduled appointment.  Your next care management appointment is by telephone on 12/10/23 at 1:00 pm  Please call the care guide team at [phone] if you need to cancel, schedule, or reschedule an appointment.   Please call the Suicide and Crisis Lifeline: 988 call the [place]  National Suicide Prevention Lifeline: 6120363610 or TTY: 239-789-0616 TTY 219-377-1500) to talk to a trained counselor if you are experiencing a Mental Health or Behavioral Health Crisis or need someone to talk to.  Heddy Shutter, RN, MSN, BSN, CCM [place]  [facility], Population Health Case Manager Phone: [phone]

## 2023-11-07 NOTE — Patient Outreach (Signed)
 Complex Care Management   Visit Note  11/07/2023  Name:  Mary Mccarthy MRN: [id number] DOB: 08-31-1940  Situation: Referral received for Complex Care Management related to Heart Failure I obtained verbal consent from Patient.  Visit completed with Patient  on the phone  Background:   Past Medical History:  Diagnosis Date   ALLERGIC RHINITIS 06/24/2007   Anemia    AVM (arteriovenous malformation) of colon    Blood transfusion without reported diagnosis 05/2015   COLONIC POLYPS, HX OF 06/24/2007   Coronary artery disease, non-occlusive 05/2015   Trop + w/ Acute Anemia =>CATH: small RI - Ostial 60%, ostial RCA 30% and dLAD 40-50%;; 9/'21: Cor Ca Score 624. Mild (25-49%) prox RCA & LAD,; Moderate (50-69%) Ostial Small RI & prox LCx.     DEPRESSION 10/08/2006   DIABETES MELLITUS, TYPE II 10/05/2006   GERD 06/24/2007   History of CVA (cerebrovascular accident) 08/10/2020   08/2020- found to be in afib with RVR, started on Eliquis  (shower of emboli from A. fib as complication of severe AS)   HYPERLIPIDEMIA 10/08/2006   HYPERTENSION 10/08/2006   INSOMNIA 09/24/2008   Left knee DJD    Nonsustained paroxysmal ventricular tachycardia (HCC) 10/2018   Post-TAVR Zio Patch: Frequent (206) runs of NSVT - Fastest 5 beats 239 bpm. Longest 9.3 Sec avg 135 bpm. (? possibly Afib w/ aberrancy)   Osteoporosis 03/10/2016   PAF (paroxysmal atrial fibrillation) (HCC)    PAF with RVR: CHA2DS2-VASc 9.  On Eliquis  and amiodarone . 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 Verlin & Dr. Lucas   Severe aortic stenosis 08/2020   Progression from Mod-Severe AS to Severe AS - noted on Echo 08/2020 -> (mean gradient progressed from 34 to 42 mmHg) this was in setting of new onset A. fib RVR, acute diastolic HF and shower of emboli CVA. ->  Referred for TAVR, completed  10/26/2020    Assessment: Patient Reported Symptoms:  Cognitive Cognitive Status: No symptoms reported      Neurological Neurological Review of Symptoms: No symptoms reported    HEENT HEENT Symptoms Reported: No symptoms reported      Cardiovascular Cardiovascular Symptoms Reported: No symptoms reported Does patient have uncontrolled Hypertension?: No Is patient checking Blood Pressure at home?: Yes Patient's Recent BP reading at home: 138/41 pulse 69 today; 142/52 on 11/06/23 Cardiovascular Management Strategies: Routine screening, Medication therapy, Adequate rest, Fluid modification, Diet modification, Activity Cardiovascular Self-Management Outcome: 4 (good) Cardiovascular Comment: patient completed appointment with cardiologist on 10/15/23 per cardiology: weight stable; BP controlled-no medication changes.  Respiratory Respiratory Symptoms Reported: No symptoms reported Other Respiratory Symptoms: continues to use O2 at 3l/[place] with activity. states, I feel good. reports ambulate around the house with walker for activity. upcoming appointment with pulmonologist on 11/16/23 and states will discuss if eligible for pulmonary rehab with pulmonologist. Patient reports she has completed a sleep study. Respiratory Management Strategies: Oxygen  therapy, Medication therapy, Activity, Fluid modification, Adequate rest, Routine screening  Endocrine Endocrine Symptoms Reported: No symptoms reported Is patient diabetic?: No Is patient checking blood sugars at home?: Yes List most recent blood sugar readings, include date and time of day: BS 88 fasting this morning and 148 today post prandial.    Gastrointestinal Gastrointestinal Symptoms Reported: No symptoms reported      Genitourinary Genitourinary Symptoms Reported: No symptoms reported  Integumentary Integumentary Symptoms Reported: No symptoms reported    Musculoskeletal Musculoskelatal Symptoms Reviewed: No symptoms  reported Additional Musculoskeletal Details: patient reports ambulates with walker. denies any difficulty at this time. Musculoskeletal Management Strategies: Medication therapy, Medical device Falls in the past year?: No    Psychosocial Psychosocial Symptoms Reported: No symptoms reported, Sadness - if selected complete PHQ 2-9 Additional Psychological Details: patient reports sometimes feeling down because she is not able to go out like she used to due to health condition. discussed LCSW available to assist with strategies. patient declines. states she tries to stay active in her home as tolerated. Behavioral Management Strategies: Medication therapy, Adequate rest, Activity        11/07/2023    PHQ2-9 Depression Screening   Little interest or pleasure in doing things Several days  Feeling down, depressed, or hopeless Several days  PHQ-2 - Total Score 2  Trouble falling or staying asleep, or sleeping too much Not at all  Feeling tired or having little energy Not at all  Poor appetite or overeating  Several days  Feeling bad about yourself - or that you are a failure or have let yourself or your family down Not at all  Trouble concentrating on things, such as reading the newspaper or watching television Not at all  Moving or speaking so slowly that other people could have noticed.  Or the opposite - being so fidgety or restless that you have been moving around a lot more than usual Several days  Thoughts that you would be better off dead, or hurting yourself in some way Not at all  PHQ2-9 Total Score 4  If you checked off any problems, how difficult have these problems made it for you to do your work, take care of things at home, or get along with other people Not difficult at all  Depression Interventions/Treatment      Vitals:   11/07/23 1326  BP: (!) 138/41  Pulse: 69  SpO2: 93%    Medications Reviewed Today     Reviewed by Pershing Skidmore M, RN (Registered Nurse) on 11/07/23  at 1315  Med List Status: <None>   Medication Order Taking? Sig Documenting Provider Last Dose Status Informant  acetaminophen  (TYLENOL ) 500 MG tablet [id number] Yes Take 500 mg by mouth every 6 (six) hours as needed for moderate pain. [provider]  Active Self, Child, Pharmacy Records           Med Note ROLENE RODRIGUEZ, [name]   Mon May 14, 2023  2:52 PM)    albuterol  (VENTOLIN  HFA) 108 [phone] Base) MCG/ACT inhaler [id number] Yes Inhale 1-2 puffs into the lungs every 6 (six) hours as needed for wheezing or shortness of breath. Cobb, Comer GAILS, NP  Active   ALPRAZolam  (XANAX ) 0.25 MG tablet [id number] Yes TAKE 1 TABLET BY MOUTH 3 TIMES DAILY AS NEEDED FOR ANXIETY. [name], MD  Active   atorvastatin  (LIPITOR ) 40 MG tablet [id number] Yes TAKE 1 TABLET BY MOUTH EVERY DAY [name], MD  Active   [employer] VITAMIN B12 1000 MCG tablet [id number] Yes TAKE 1 TABLET BY MOUTH EVERY DAY [name], MD  Active Self, Child, Pharmacy Records  glucose blood (ACCU-CHEK GUIDE TEST) test strip [id number]  Use as instructed twice per day E11.9 [name], MD  Active   hydrALAZINE  (APRESOLINE ) 100 MG tablet [id number] Yes Take 1 tablet (100 mg total) by mouth 3 (three) times daily. [name], MD  Active  insulin  glargine (LANTUS  SOLOSTAR) 100 UNIT/ML Solostar Pen [id number] Yes Inject 35 Units into the skin daily. [name], MD  Active   insulin  glargine-yfgn (SEMGLEE ) 100 UNIT/ML Pen [id number]  SMARTSIG:30 Unit(s) SUB-Q Daily  Patient not taking: Reported on 11/07/2023   [provider]  Active   Insulin  Pen Needle (TECHLITE PEN NEEDLES) 32G X 4 MM MISC [id number]  Use as directed once daily with insulin  shot John, James W, MD  Active   isosorbide  mononitrate (IMDUR ) 60 MG 24 hr tablet [id number] Yes Take 1 tablet (60 mg total) by mouth daily. [name], MD  Active   levothyroxine  (SYNTHROID ) 25 MCG tablet [id number] Yes TAKE 1 TABLET BY MOUTH EVERY DAY [name], MD   Active   metoprolol  succinate (TOPROL -XL) 25 MG 24 hr tablet [id number] Yes Take 0.5 tablets (12.5 mg total) by mouth daily. [name], MD  Active   Miconazole Nitrate (MICONAZOLE 3 COMBO PACK VA) [id number] Yes Place 1 application  vaginally at bedtime.  Patient taking differently: Place 1 application  vaginally at bedtime.   [provider]  Active   mirtazapine  (REMERON ) 15 MG tablet [id number] Yes TAKE 1 TABLET BY MOUTH EVERYDAY AT BEDTIME [name], MD  Active Self, Child, Pharmacy Records           Med Note [facility] [place], [name]   Mon May 14, 2023  3:12 PM) Pt adamant of taking medication. Dispense report does not support claim.  pantoprazole  (PROTONIX ) 40 MG tablet [id number] Yes TAKE 1 TABLET BY MOUTH TWICE A DAY Sebastian Lamarr SAUNDERS, PA-C  Active Self, Child, Pharmacy Records  solifenacin  (VESICARE ) 5 MG tablet [id number] Yes Take 1 tablet (5 mg total) by mouth daily. [name], MD  Active Self, Child, Pharmacy Records  torsemide  (DEMADEX ) 20 MG tablet [id number] Yes Take 2 tablets (40 mg total) by mouth daily. [name], MD  Active           Recommendation:   Patent to continue to follow up with provider visits as scheduled  Follow Up Plan:   Telephone follow up appointment date/time:  12/10/23 at 1:00 pm  Heddy Shutter, RN, MSN, BSN, CCM [place]  [facility], Population Health Case Manager Phone: [phone]

## 2023-11-16 ENCOUNTER — Ambulatory Visit: Admitting: Nurse Practitioner

## 2023-11-16 DIAGNOSIS — R2681 Unsteadiness on feet: Secondary | ICD-10-CM | POA: Diagnosis not present

## 2023-11-16 DIAGNOSIS — I639 Cerebral infarction, unspecified: Secondary | ICD-10-CM | POA: Diagnosis not present

## 2023-11-16 DIAGNOSIS — R269 Unspecified abnormalities of gait and mobility: Secondary | ICD-10-CM | POA: Diagnosis not present

## 2023-12-01 DIAGNOSIS — J9610 Chronic respiratory failure, unspecified whether with hypoxia or hypercapnia: Secondary | ICD-10-CM | POA: Diagnosis not present

## 2023-12-06 ENCOUNTER — Other Ambulatory Visit: Payer: Self-pay | Admitting: Internal Medicine

## 2023-12-10 ENCOUNTER — Other Ambulatory Visit: Payer: Self-pay

## 2023-12-10 NOTE — Patient Outreach (Signed)
 Complex Care Management   Visit Note  12/10/2023  Name:  Mary Mccarthy MRN: [id number] DOB: [birth year]  Situation: Referral received for Complex Care Management related to Heart Failure I obtained verbal consent from Patient.  Visit completed with Patient  on the phone  Background:   Past Medical History:  Diagnosis Date   ALLERGIC RHINITIS 06/24/2007   Anemia    AVM (arteriovenous malformation) of colon    Blood transfusion without reported diagnosis 05/2015   COLONIC POLYPS, HX OF 06/24/2007   Coronary artery disease, non-occlusive 05/2015   Trop + w/ Acute Anemia =>CATH: small RI - Ostial 60%, ostial RCA 30% and dLAD 40-50%;; 9/'21: Cor Ca Score 624. Mild (25-49%) prox RCA & LAD,; Moderate (50-69%) Ostial Small RI & prox LCx.     DEPRESSION 10/08/2006   DIABETES MELLITUS, TYPE II 10/05/2006   GERD 06/24/2007   History of CVA (cerebrovascular accident) 08/10/2020   08/2020- found to be in afib with RVR, started on Eliquis  (shower of emboli from A. fib as complication of severe AS)   HYPERLIPIDEMIA 10/08/2006   HYPERTENSION 10/08/2006   INSOMNIA 09/24/2008   Left knee DJD    Nonsustained paroxysmal ventricular tachycardia (HCC) 10/2018   Post-TAVR Zio Patch: Frequent (206) runs of NSVT - Fastest 5 beats 239 bpm. Longest 9.3 Sec avg 135 bpm. (? possibly Afib w/ aberrancy)   Osteoporosis 03/10/2016   PAF (paroxysmal atrial fibrillation) (HCC)    PAF with RVR: CHA2DS2-VASc 9.  On Eliquis  and amiodarone . 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 Verlin & Dr. Lucas   Severe aortic stenosis 08/2020   Progression from Mod-Severe AS to Severe AS - noted on Echo 08/2020 -> (mean gradient progressed from 34 to 42 mmHg) this was in setting of new onset A. fib RVR, acute diastolic HF and shower of emboli CVA. ->  Referred for TAVR, completed  10/26/2020    Assessment:Patient reports, I feel like everything is ok. I feel better than I have felt in a long time. Patient Reported Symptoms:  Cognitive Cognitive Status: No symptoms reported      Neurological Neurological Review of Symptoms: No symptoms reported    HEENT HEENT Symptoms Reported: No symptoms reported      Cardiovascular Cardiovascular Symptoms Reported: No symptoms reported Does patient have uncontrolled Hypertension?:  (patient reports 147/54 before medications. patient reports she checks her BP throughout the day to make sure her blood pressure is down. reiterated goal < 140/90.) Weight: 184 lb 6.4 oz (83.6 kg)  Respiratory Respiratory Symptoms Reported: No symptoms reported Other Respiratory Symptoms: Patient reports she has not had to use oxygen . She reports she checkes her O2 sat daily. She reports O2 sat 96% on RA.    Endocrine Endocrine Symptoms Reported: No symptoms reported Is patient diabetic?: Yes Is patient checking blood sugars at home?: Yes List most recent blood sugar readings, include date and time of day: BS 128 ac this morning. denies any hypoglycemic episodes since last encounter. Endocrine Self-Management Outcome: 4 (good)  Gastrointestinal Gastrointestinal Symptoms Reported: No symptoms reported Additional Gastrointestinal Details: patient reports sometimes she get constipated and will take a Laxative. she denies constipation at this time. Last bowel movement was yesterday and patient reports it was normal.      Genitourinary Genitourinary Symptoms Reported: Pain/burning with urination Additional Genitourinary Details: patient reports burning  with urination this morning and states she took a couple Azo tablets. patient reports urine a almost a mustard color. . she reports she has been to the bathroom since this morning and the color was normal and she did not have any burning. denies any other problems at this time.    Integumentary  Additional Integumentary Details: patient reports dry skin-uses moisturizers.    Musculoskeletal Musculoskelatal Symptoms Reviewed: No symptoms reported        Psychosocial Psychosocial Symptoms Reported: No symptoms reported Additional Psychological Details: patient reports sometimes, siturational feel due to a lot of sickness in her family.states she does not need to see or talk to anyone. declines LCSW referral.          12/10/2023    PHQ2-9 Depression Screening   Little interest or pleasure in doing things Several days  Feeling down, depressed, or hopeless Several days  PHQ-2 - Total Score 2  Trouble falling or staying asleep, or sleeping too much Not at all  Feeling tired or having little energy Several days  Poor appetite or overeating  Several days  Feeling bad about yourself - or that you are a failure or have let yourself or your family down Not at all  Trouble concentrating on things, such as reading the newspaper or watching television Not at all  Moving or speaking so slowly that other people could have noticed.  Or the opposite - being so fidgety or restless that you have been moving around a lot more than usual Several days  Thoughts that you would be better off dead, or hurting yourself in some way Not at all  PHQ2-9 Total Score 5  If you checked off any problems, how difficult have these problems made it for you to do your work, take care of things at home, or get along with other people Not difficult at all  Depression Interventions/Treatment Medication   depression     12/10/2023    1:31 PM 09/07/2023   11:42 AM 03/27/2022    1:15 PM  GAD 7 : Generalized Anxiety Score  Nervous, Anxious, on Edge 1 3 3   Control/stop worrying 1 1 3   Worry too much - different things 0 3 3  Trouble relaxing 1 3 3   Restless 1 0 2  Easily annoyed or irritable 1 1 2   Afraid - awful might happen 0 0 2  Total GAD 7 Score 5 11 18   Anxiety Difficulty Not difficult at all Not difficult at  all Somewhat difficult     Vitals:   12/10/23 1305  BP: (!) 147/54  Pulse: 64   Patient reports her son manages her medication and she does not have the list with her at this time, so she is not able to confirm all or her medications on list. Medications Reviewed Today     Reviewed by Apolonia Ellwood M, RN (Registered Nurse) on 12/10/23 at 1342  Med List Status: <None>   Medication Order Taking? Sig Documenting Provider Last Dose Status Informant  acetaminophen  (TYLENOL ) 500 MG tablet [id number]  Take 500 mg by mouth every 6 (six) hours as needed for moderate pain. [provider]  Active Self, Child, Pharmacy Records           Med Note ROLENE RODRIGUEZ, [name]   Mon May 14, 2023  2:52 PM)    albuterol  (VENTOLIN  HFA) 108 (90 Base) MCG/ACT inhaler [id number]  Inhale 1-2 puffs into the lungs every 6 (six) hours as needed for wheezing  or shortness of breath. Cobb, Comer GAILS, NP  Active   ALPRAZolam  (XANAX ) 0.25 MG tablet [id number]  TAKE 1 TABLET BY MOUTH 3 TIMES DAILY AS NEEDED FOR ANXIETY. [name], MD  Active   atorvastatin  (LIPITOR ) 40 MG tablet [id number]  TAKE 1 TABLET BY MOUTH EVERY DAY [name], MD  Active   cyanocobalamin  (VITAMIN B12) 1000 MCG tablet [id number]  TAKE 1 TABLET BY MOUTH EVERY DAY [name], MD  Active   glucose blood (ACCU-CHEK GUIDE TEST) test strip [id number]  Use as instructed twice per day E11.9 [name], MD  Active   hydrALAZINE  (APRESOLINE ) 100 MG tablet [id number]  Take 1 tablet (100 mg total) by mouth 3 (three) times daily. [name], MD  Active   insulin  glargine (LANTUS  SOLOSTAR) 100 UNIT/ML Solostar Pen [id number]  Inject 35 Units into the skin daily. [name], MD  Active   insulin  glargine-yfgn (SEMGLEE ) 100 UNIT/ML Pen [id number]  SMARTSIG:30 Unit(s) SUB-Q Daily  Patient not taking: Reported on 11/07/2023   [provider]  Active   Insulin  Pen Needle (TECHLITE PEN NEEDLES) 32G X 4 MM MISC [id number]  Use as  directed once daily with insulin  shot John, James W, MD  Active   isosorbide  mononitrate (IMDUR ) 60 MG 24 hr tablet [id number]  Take 1 tablet (60 mg total) by mouth daily. [name], MD  Expired 11/07/23 2359   levothyroxine  (SYNTHROID ) 25 MCG tablet [id number]  TAKE 1 TABLET BY MOUTH EVERY DAY [name], MD  Active   metoprolol  succinate (TOPROL -XL) 25 MG 24 hr tablet [id number]  Take 0.5 tablets (12.5 mg total) by mouth daily. [name], MD  Active   Miconazole Nitrate (MICONAZOLE 3 COMBO PACK VA) [id number]  Place 1 application  vaginally at bedtime.  Patient not taking: Reported on 12/10/2023   [provider]  Active   mirtazapine  (REMERON ) 15 MG tablet [id number]  TAKE 1 TABLET BY MOUTH EVERYDAY AT BEDTIME [name], MD  Active Self, Child, Pharmacy Records           Med Note [facility] Donalds, [name]   Mon May 14, 2023  3:12 PM) Pt adamant of taking medication. Dispense report does not support claim.  pantoprazole  (PROTONIX ) 40 MG tablet [id number]  TAKE 1 TABLET BY MOUTH TWICE A DAY Sebastian Lamarr SAUNDERS, PA-C  Active Self, Child, Pharmacy Records  solifenacin  (VESICARE ) 5 MG tablet [id number]  Take 1 tablet (5 mg total) by mouth daily. [name], MD  Active Self, Child, Pharmacy Records  torsemide  (DEMADEX ) 20 MG tablet [id number]  Take 2 tablets (40 mg total) by mouth daily. [name], MD  Active           Recommendation:   PCP Follow-up Specialty provider follow-up Pulmonology appointment 12/17/23; PCP 12/28/23 Continue Current Plan of Care  Follow Up Plan:   Telephone follow up appointment date/time:  01/23/24 at 1:00 pm  Heddy Shutter, RN, MSN, BSN, CCM [place]  [facility], Population Health Case Manager Phone: [phone]

## 2023-12-10 NOTE — Patient Instructions (Signed)
 Visit Information  Thank you for taking time to visit with me today. Please don't hesitate to contact me if I can be of assistance to you before our next scheduled appointment.  Your next care management appointment is by telephone on 01/23/24 at 1:00 pm   Please call the care guide team at [phone] if you need to cancel, schedule, or reschedule an appointment.   Please call the Suicide and Crisis Lifeline: 988 call the [place]  National Suicide Prevention Lifeline: 7820677197 or TTY: (873)080-9590 TTY [phone]) to talk to a trained counselor if you are experiencing a Mental Health or Behavioral Health Crisis or need someone to talk to.  Heddy Shutter, RN, MSN, BSN, CCM [place]  [facility], Population Health Case Manager Phone: [phone]

## 2023-12-17 ENCOUNTER — Ambulatory Visit (INDEPENDENT_AMBULATORY_CARE_PROVIDER_SITE_OTHER): Admitting: Nurse Practitioner

## 2023-12-17 ENCOUNTER — Encounter: Payer: Self-pay | Admitting: Nurse Practitioner

## 2023-12-17 VITALS — BP 138/75 | HR 72 | Temp 97.4°F | Ht 61.0 in | Wt 183.0 lb

## 2023-12-17 DIAGNOSIS — J849 Interstitial pulmonary disease, unspecified: Secondary | ICD-10-CM

## 2023-12-17 DIAGNOSIS — J984 Other disorders of lung: Secondary | ICD-10-CM | POA: Diagnosis not present

## 2023-12-17 DIAGNOSIS — G4734 Idiopathic sleep related nonobstructive alveolar hypoventilation: Secondary | ICD-10-CM | POA: Diagnosis not present

## 2023-12-17 DIAGNOSIS — I5032 Chronic diastolic (congestive) heart failure: Secondary | ICD-10-CM | POA: Diagnosis not present

## 2023-12-17 NOTE — Patient Instructions (Addendum)
 Continue Albuterol  inhaler 1- 2 puffs every 6 hours as needed for shortness of breath or wheezing. Notify if symptoms persist despite rescue inhaler/neb use.  Continue supplemental oxygen  3 lpm at night, every night, and as needed with activity for oxygen  goal >88-90%.   Given your symptoms and continued low oxygen  at night despite oxygen  use, I am concerned that you may have sleep disordered breathing with sleep apnea. You will need an in lab sleep study for further evaluation. Someone will contact you to schedule this.   We discussed how untreated sleep apnea puts an individual at risk for cardiac arrhthymias, pulm HTN, DM, stroke and increases their risk for daytime accidents. We also briefly reviewed treatment options including weight loss, side sleeping position, oral appliance, CPAP therapy or referral to ENT for possible surgical options  Use caution when driving and pull over if you become sleepy.  Repeat lung function testing when you come back    Follow up in 10-12 weeks after 30 minute PFT and sleep study with Dr. Theophilus. If symptoms do not improve or worsen, please contact office for sooner follow up or seek emergency care.

## 2023-12-17 NOTE — Progress Notes (Signed)
 @Patient  ID: Mary Mccarthy, female    DOB: January 17, 1941, 83 y.o.   MRN: [id number]  Chief Complaint  Patient presents with   Follow-up    Breathing is about the same.    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  She is a patient of Dr. Jiles and last seen in office 09/20/2023 by [facility] NP.  Past medical history significant for heart failure, PAF, CKD, AAS status post TAVR  TEST/EVENTS:  05/27/2023 CT chest: Atherosclerosis.  Scattered prominent mediastinal nodes, similar to prior and likely reactive.  Small hiatal hernia.  Small left greater than right pleural effusions.  Diffuse multifocal patchy airspace and groundglass opacities throughout both lungs. 09/20/2023 PFT: FVC 79, FEV1 84, ratio 81, DLCOcor 57 11/01/2023 ONO on 2 lpm: 5.5 h </88%, SpO2  low 81%  06/20/2023: OV with Dr. Theophilus.  Hospitalized from April 7 to Jun 07, 2023 for acute hypoxic respiratory failure.  Previously on 2 L of O2.  Continues to require oxygen  for support.  During her hospitalization she was treated with diuretics for suspected heart failure, which help reduce fluid overload.  Still having shortness of breath and swelling in the legs.  This prevents her from wearing regular shoes.  There was also some concern for amiodarone  lung toxicity so this was discontinued and she was started on prednisone  40 mg daily.  She was also treated with antibiotics in the hospital due to a possible aspiration event.  Has CHF and CKD.  Biopsy not recommended due to age and condition.  Plan to taper off prednisone  now that she is off amiodarone .  Check autoimmune serologies.  Schedule high-resolution CT chest and PFT in 2 to 3 months.  Discussed prognosis and goals of care.  Prefers to be informed about her condition and prognosis and avoid aggressive interventions.  Currently under palliative and hospice care.  09/20/2023: Mary Mccarthy with Mary Nesheim  NP Mary Mccarthy is an 83 year old female who presents for follow-up after PFT. She is accompanied by her son. She has a mildly moderate restriction and reduced diffusion capacity that corrects to normal for alveolar volume.  No obstructive lung disease.  Did have a little bit of improvement after bronchodilator administration.  Her breathing has improved since the last visit. She uses three liters of oxygen  at night and does not monitor her oxygen  levels while moving.  Not really using her oxygen  during the day.  She experiences occasional shortness of breath and a slight wheeze when exerted. The inhaler used during a test made her shaky but she is unsure if it made any difference in her breathing or shortness of breath.  No cough, hemoptysis, fevers. She describes swelling in her legs as improved, but notes new onset of redness and warmth in both calves which has been ongoing for about a month.  Has not been moving as much as she was prior to her hospitalization.  Relatively sedentary.  No numbness, tingling, or pain when walking. She is not experiencing calf pain.  Her weight has increased slightly since the last visit but stable since she saw cardiology last time, and she is above her target weight of 178-179 pounds. She is mindful of her salt intake.     12/17/2023: Today - follow up Discussed the use of AI scribe software for clinical note transcription with the patient, who gave verbal consent to proceed.  History of Present Illness Mary Mccarthy  is an 83 year old female with heart disease and lung disease who presents for evaluation of low nocturnal oxygen  levels.  An overnight oxygen  study revealed that her oxygen  saturation remained below 88% for nearly five and a half hours, despite using supplemental oxygen  at two liters per minute. She does not use her oxygen  regularly, having only used it twice in the last two months.  She has a history of heart disease and lung  disease, with prior CT scan in August showing chronic airway inflammation and findings of nonspecific interstitial pneumonitis since prior hypersensitivity pneumonitis panel was negative. Her lung function in August was reported as normal, but she did have a low diffusion capacity. A CT scan in August showed stable findings with chronic airway inflammation. Her breathing feels better than during her last visit, and she has not experienced any swelling in her legs recently. No cough, wheezing, chest congestion, PND, orthopnea.   She has not used her rescue inhaler, Albuterol , more than once in the past two months.   Rare snoring. No daytime fatigue, napping, morning headaches, teeth grinding. She does not drive. She feels she sleeps well at night. Gets up a few times to use the restroom. No sleep aids.     Allergies  Allergen Reactions   Ibuprofen Other (See Comments)    Bleeding events   Amiodarone  Other (See Comments)    Lung toxicity    Immunization History  Administered Date(s) Administered   Fluad Quad(high Dose 65+) 12/26/2018, 12/06/2019, 10/13/2020   INFLUENZA, HIGH DOSE SEASONAL PF 11/19/2012, 11/18/2013, 02/18/2015, 12/28/2015, 12/07/2016, 10/17/2017   Influenza Split 12/21/2010, 12/28/2011   Influenza Whole 12/01/2005, 03/26/2009, 12/07/2009   PFIZER(Purple Top)SARS-COV-2 Vaccination 05/01/2019, 05/26/2019, 05/12/2020   Pneumococcal Conjugate-13 12/03/2012   Pneumococcal Polysaccharide-23 11/06/2005   Td 09/24/2008   Tdap 09/13/2018, 09/13/2018    Past Medical History:  Diagnosis Date   ALLERGIC RHINITIS 06/24/2007   Anemia    AVM (arteriovenous malformation) of colon    Blood transfusion without reported diagnosis 05/2015   COLONIC POLYPS, HX OF 06/24/2007   Coronary artery disease, non-occlusive 05/2015   Trop + w/ Acute Anemia =>CATH: small RI - Ostial 60%, ostial RCA 30% and dLAD 40-50%;; 9/'21: Cor Ca Score 624. Mild (25-49%) prox RCA & LAD,; Moderate (50-69%)  Ostial Small RI & prox LCx.     DEPRESSION 10/08/2006   DIABETES MELLITUS, TYPE II 10/05/2006   GERD 06/24/2007   History of CVA (cerebrovascular accident) 08/10/2020   08/2020- found to be in afib with RVR, started on Eliquis  (shower of emboli from A. fib as complication of severe AS)   HYPERLIPIDEMIA 10/08/2006   HYPERTENSION 10/08/2006   INSOMNIA 09/24/2008   Left knee DJD    Nonsustained paroxysmal ventricular tachycardia (HCC) 10/2018   Post-TAVR Zio Patch: Frequent (206) runs of NSVT - Fastest 5 beats 239 bpm. Longest 9.3 Sec avg 135 bpm. (? possibly Afib w/ aberrancy)   Osteoporosis 03/10/2016   PAF (paroxysmal atrial fibrillation) (HCC)    PAF with RVR: CHA2DS2-VASc 9.  On Eliquis  and amiodarone . 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 Verlin & Dr. Lucas   Severe aortic stenosis 08/2020   Progression from Mod-Severe AS to Severe AS - noted on Echo 08/2020 -> (mean gradient progressed from 34 to 42 mmHg) this was in setting of new onset A. fib  RVR, acute diastolic HF and shower of emboli CVA. ->  Referred for TAVR, completed 10/26/2020    Tobacco History: Social History   Tobacco Use  Smoking Status Never   Passive exposure: Past  Smokeless Tobacco Never   Counseling given: Not Answered   Outpatient Medications Prior to Visit  Medication Sig Dispense Refill   acetaminophen  (TYLENOL ) 500 MG tablet Take 500 mg by mouth every 6 (six) hours as needed for moderate pain.     albuterol  (VENTOLIN  HFA) 108 (90 Base) MCG/ACT inhaler Inhale 1-2 puffs into the lungs every 6 (six) hours as needed for wheezing or shortness of breath. 8 g 2   ALPRAZolam  (XANAX ) 0.25 MG tablet TAKE 1 TABLET BY MOUTH 3 TIMES DAILY AS NEEDED FOR ANXIETY. 90 tablet 2   atorvastatin  (LIPITOR ) 40 MG tablet TAKE 1 TABLET BY MOUTH EVERY DAY 90 tablet 3   cyanocobalamin   (VITAMIN B12) 1000 MCG tablet TAKE 1 TABLET BY MOUTH EVERY DAY 90 tablet 2   glucose blood (ACCU-CHEK GUIDE TEST) test strip Use as instructed twice per day E11.9 200 each 12   hydrALAZINE  (APRESOLINE ) 100 MG tablet Take 1 tablet (100 mg total) by mouth 3 (three) times daily. 270 tablet 3   insulin  glargine (LANTUS  SOLOSTAR) 100 UNIT/ML Solostar Pen Inject 35 Units into the skin daily. 15 mL 11   insulin  glargine-yfgn (SEMGLEE ) 100 UNIT/ML Pen SMARTSIG:30 Unit(s) SUB-Q Daily     Insulin  Pen Needle (TECHLITE PEN NEEDLES) 32G X 4 MM MISC Use as directed once daily with insulin  shot 100 each 3   isosorbide  mononitrate (IMDUR ) 60 MG 24 hr tablet Take 1 tablet (60 mg total) by mouth daily. 90 tablet 3   levothyroxine  (SYNTHROID ) 25 MCG tablet TAKE 1 TABLET BY MOUTH EVERY DAY 90 tablet 2   metoprolol  succinate (TOPROL -XL) 25 MG 24 hr tablet Take 0.5 tablets (12.5 mg total) by mouth daily. 45 tablet 3   mirtazapine  (REMERON ) 15 MG tablet TAKE 1 TABLET BY MOUTH EVERYDAY AT BEDTIME 90 tablet 3   pantoprazole  (PROTONIX ) 40 MG tablet TAKE 1 TABLET BY MOUTH TWICE A DAY 180 tablet 1   solifenacin  (VESICARE ) 5 MG tablet Take 1 tablet (5 mg total) by mouth daily. 90 tablet 3   torsemide  (DEMADEX ) 20 MG tablet Take 2 tablets (40 mg total) by mouth daily. 180 tablet 3   Miconazole Nitrate (MICONAZOLE 3 COMBO PACK VA) Place 1 application  vaginally at bedtime. (Patient not taking: Reported on 12/17/2023)     No facility-administered medications prior to visit.     Review of Systems: as above    Physical Exam:  BP 138/75 (BP Location: Left Arm, Patient Position: Sitting, Cuff Size: Large)   Pulse 72   Temp (!) 97.4 F (36.3 C) (Oral)   Ht 5' 1 (1.549 m)   Wt 183 lb (83 kg)   SpO2 95% Comment: RA  BMI 34.58 kg/m   GEN: Pleasant, interactive, chronically-ill appearing; obese; in no acute distress HEENT:  Normocephalic and atraumatic. PERRLA. Sclera white. Nasal turbinates pink, moist and patent  bilaterally. No rhinorrhea present. Oropharynx pink and moist, without exudate or edema. No lesions, ulcerations, or postnasal drip. Mallampati III NECK:  Supple w/ fair ROM. No JVD present. No lymphadenopathy.   CV: RRR, no m/r/g, no peripheral edema. Pulses intact, +2 bilaterally. No cyanosis, pallor or clubbing. PULMONARY:  Unlabored, regular breathing. Clear bilaterally A&P w/o wheezes/rales/rhonchi. No accessory muscle use.  GI: BS present and normoactive. Soft, non-tender to palpation.  MSK: No erythema, warmth or tenderness on exam. Cap refil <2 sec all extrem.  Neuro: A/Ox3. No focal deficits noted.   Skin: Warm, no lesions or rashe Psych: Normal affect and behavior. Judgement and thought content appropriate.     Lab Results:  CBC    Component Value Date/Time   WBC 5.3 10/15/2023 1554   WBC 8.0 07/17/2023 1900   RBC 3.22 (L) 10/15/2023 1554   RBC 2.87 (L) 07/17/2023 1900   HGB 10.1 (L) 10/15/2023 1554   HCT 31.3 (L) 10/15/2023 1554   PLT 155 10/15/2023 1554   MCV 97 10/15/2023 1554   MCH 31.4 10/15/2023 1554   MCH 32.1 07/17/2023 1900   MCHC 32.3 10/15/2023 1554   MCHC 32.2 07/17/2023 1900   RDW 12.9 10/15/2023 1554   LYMPHSABS 1.5 07/17/2023 1900   LYMPHSABS 1.6 02/11/2021 1538   MONOABS 0.3 07/17/2023 1900   EOSABS 0.0 07/17/2023 1900   EOSABS 0.0 02/11/2021 1538   BASOSABS 0.0 07/17/2023 1900   BASOSABS 0.0 02/11/2021 1538    BMET    Component Value Date/Time   NA 142 10/15/2023 1554   K 4.1 10/15/2023 1554   CL 104 10/15/2023 1554   CO2 29 10/15/2023 1554   GLUCOSE 82 10/15/2023 1554   GLUCOSE 194 (H) 07/19/2023 1035   GLUCOSE 127 (H) 12/01/2005 1330   BUN 41 (H) 10/15/2023 1554   CREATININE 2.24 (H) 10/15/2023 1554   CALCIUM  9.9 10/15/2023 1554   GFRNONAA 23 (L) 07/17/2023 1900   GFRAA 57 (L) 10/01/2019 1249    BNP    Component Value Date/Time   BNP 322.8 (H) 06/03/2023 1217     Imaging:  No results found.  Administration History      None          Latest Ref Rng & Units 06/20/2023    3:38 PM 02/02/2021   11:41 AM  PFT Results  FVC-Pre L 1.66  1.35   FVC-Predicted Pre % 79  62   FVC-Post L 1.81  1.51   FVC-Predicted Post % 87  69   Pre FEV1/FVC % % 78  79   Post FEV1/FCV % % 81  77   FEV1-Pre L 1.29  1.07   FEV1-Predicted Pre % 84  66   FEV1-Post L 1.46  1.17   DLCO uncorrected ml/min/mmHg 7.94  4.57   DLCO UNC% % 47  27   DLCO corrected ml/min/mmHg 9.65    DLCO COR %Predicted % 57    DLVA Predicted % 84  61   TLC L  4.13   TLC % Predicted %  89   RV % Predicted %  120     No results found for: NITRICOXIDE      Assessment & Plan:   No problem-specific Assessment & Plan notes found for this encounter. Assessment and Plan Assessment & Plan Sleep-related hypoxemia and suspected sleep-disordered breathing Persistent nocturnal hypoxemia despite supplemental oxygen , with oxygen  saturation below 88% for over five hours. Suspected underlying sleep-disordered breathing, possibly sleep apnea, contributing to hypoxemia. Reviewed risks of untreated OSA and potential treatment options. She does not drive. Split night in lab study ordered for further evaluation. Healthy weight management encouraged.  - Ordered in-lab sleep study at [facility] to evaluate for sleep-disordered breathing. - Instructed to restart nocturnal oxygen  therapy every night.  ILD Not UIP. Changes of chronic hypersensitivity pneumonitis on CT read but prior panel negative. No known exposures. Symptomatically improved. No evidence of progression on  CT imaging. Will continue with watchful waiting and monitoring. No current respiratory symptoms reported. Obtain PFT at follow up.  - Continue to monitor respiratory symptoms - Encouraged to remain active  - Will schedule repeat lung function test at the end of January.  Chronic heart disease Increased risk of complications due to nocturnal hypoxemia. Potential for worsening heart  failure, arrhythmias, and other cardiac issues if hypoxemia is not addressed. - Address nocturnal hypoxemia to prevent exacerbation of heart disease.  Amiodarone  lung toxicity Clinically improved. Off amio. Mild restriction in lung function. See above         Advised if symptoms do not improve or worsen, to please contact office for sooner follow up or seek emergency care.   I spent 35 minutes of dedicated to the care of this patient on the date of this encounter to include pre-visit review of records, face-to-face time with the patient discussing conditions above, post visit ordering of testing, clinical documentation with the electronic health record, making appropriate referrals as documented, and communicating necessary findings to members of the patients care team.  Comer LULLA Rouleau, NP 12/17/2023  Pt aware and understands NP's role.

## 2023-12-28 ENCOUNTER — Ambulatory Visit: Admitting: Internal Medicine

## 2023-12-28 ENCOUNTER — Encounter: Payer: Self-pay | Admitting: Internal Medicine

## 2023-12-28 ENCOUNTER — Ambulatory Visit: Payer: Self-pay | Admitting: Internal Medicine

## 2023-12-28 VITALS — BP 122/82 | HR 67 | Temp 97.5°F | Ht 61.0 in | Wt 184.0 lb

## 2023-12-28 DIAGNOSIS — E559 Vitamin D deficiency, unspecified: Secondary | ICD-10-CM

## 2023-12-28 DIAGNOSIS — N1832 Chronic kidney disease, stage 3b: Secondary | ICD-10-CM | POA: Diagnosis not present

## 2023-12-28 DIAGNOSIS — B001 Herpesviral vesicular dermatitis: Secondary | ICD-10-CM

## 2023-12-28 DIAGNOSIS — I5032 Chronic diastolic (congestive) heart failure: Secondary | ICD-10-CM | POA: Diagnosis not present

## 2023-12-28 DIAGNOSIS — E538 Deficiency of other specified B group vitamins: Secondary | ICD-10-CM | POA: Diagnosis not present

## 2023-12-28 DIAGNOSIS — Z23 Encounter for immunization: Secondary | ICD-10-CM | POA: Diagnosis not present

## 2023-12-28 DIAGNOSIS — Z0000 Encounter for general adult medical examination without abnormal findings: Secondary | ICD-10-CM | POA: Diagnosis not present

## 2023-12-28 DIAGNOSIS — Z0001 Encounter for general adult medical examination with abnormal findings: Secondary | ICD-10-CM

## 2023-12-28 DIAGNOSIS — J309 Allergic rhinitis, unspecified: Secondary | ICD-10-CM

## 2023-12-28 DIAGNOSIS — L989 Disorder of the skin and subcutaneous tissue, unspecified: Secondary | ICD-10-CM | POA: Insufficient documentation

## 2023-12-28 DIAGNOSIS — E1122 Type 2 diabetes mellitus with diabetic chronic kidney disease: Secondary | ICD-10-CM | POA: Diagnosis not present

## 2023-12-28 DIAGNOSIS — E785 Hyperlipidemia, unspecified: Secondary | ICD-10-CM

## 2023-12-28 DIAGNOSIS — I10 Essential (primary) hypertension: Secondary | ICD-10-CM

## 2023-12-28 DIAGNOSIS — E1169 Type 2 diabetes mellitus with other specified complication: Secondary | ICD-10-CM

## 2023-12-28 LAB — URINALYSIS, ROUTINE W REFLEX MICROSCOPIC
Bilirubin Urine: NEGATIVE
Hgb urine dipstick: NEGATIVE
Ketones, ur: NEGATIVE
Nitrite: POSITIVE — AB
Specific Gravity, Urine: <=1.005 — AB (ref 1.000–1.030)
Total Protein, Urine: NEGATIVE
Urine Glucose: NEGATIVE
Urobilinogen, UA: 0.2 (ref 0.0–1.0)
pH: 6 (ref 5.0–8.0)

## 2023-12-28 LAB — BASIC METABOLIC PANEL WITH GFR
BUN: 40 mg/dL — ABNORMAL HIGH (ref 6–23)
CO2: 30 meq/L (ref 19–32)
Calcium: 9.4 mg/dL (ref 8.4–10.5)
Chloride: 103 meq/L (ref 96–112)
Creatinine, Ser: 1.92 mg/dL — ABNORMAL HIGH (ref 0.40–1.20)
GFR: 23.53 mL/min — ABNORMAL LOW (ref >60.00)
Glucose, Bld: 84 mg/dL (ref 70–99)
Potassium: 3.5 meq/L (ref 3.5–5.1)
Sodium: 140 meq/L (ref 135–145)

## 2023-12-28 LAB — CBC WITH DIFFERENTIAL/PLATELET
Basophils Absolute: 0 K/uL (ref 0.0–0.1)
Basophils Relative: 0.3 % (ref 0.0–3.0)
Eosinophils Absolute: 0.1 K/uL (ref 0.0–0.7)
Eosinophils Relative: 1.5 % (ref 0.0–5.0)
HCT: 29.7 % — ABNORMAL LOW (ref 36.0–46.0)
Hemoglobin: 10.2 g/dL — ABNORMAL LOW (ref 12.0–15.0)
Lymphocytes Relative: 30.6 % (ref 12.0–46.0)
Lymphs Abs: 1.6 K/uL (ref 0.7–4.0)
MCHC: 34.3 g/dL (ref 30.0–36.0)
MCV: 93 fl (ref 78.0–100.0)
Monocytes Absolute: 0.3 K/uL (ref 0.1–1.0)
Monocytes Relative: 6.4 % (ref 3.0–12.0)
Neutro Abs: 3.2 K/uL (ref 1.4–7.7)
Neutrophils Relative %: 61.2 % (ref 43.0–77.0)
Platelets: 127 K/uL — ABNORMAL LOW (ref 150.0–400.0)
RBC: 3.19 Mil/uL — ABNORMAL LOW (ref 3.87–5.11)
RDW: 13.9 % (ref 11.5–15.5)
WBC: 5.2 K/uL (ref 4.0–10.5)

## 2023-12-28 LAB — VITAMIN D 25 HYDROXY (VIT D DEFICIENCY, FRACTURES): VITD: 7.2 ng/mL — ABNORMAL LOW (ref 30.00–100.00)

## 2023-12-28 LAB — HEPATIC FUNCTION PANEL
ALT: 12 U/L (ref 0–35)
AST: 18 U/L (ref 0–37)
Albumin: 3.5 g/dL (ref 3.5–5.2)
Alkaline Phosphatase: 72 U/L (ref 39–117)
Bilirubin, Direct: 0.2 mg/dL (ref 0.0–0.3)
Total Bilirubin: 0.7 mg/dL (ref 0.2–1.2)
Total Protein: 6.6 g/dL (ref 6.0–8.3)

## 2023-12-28 LAB — LIPID PANEL
Cholesterol: 72 mg/dL (ref 0–200)
HDL: 25.8 mg/dL — ABNORMAL LOW (ref >39.00)
LDL Cholesterol: 32 mg/dL (ref 0–99)
NonHDL: 46.47
Total CHOL/HDL Ratio: 3
Triglycerides: 74 mg/dL (ref 0.0–149.0)
VLDL: 14.8 mg/dL (ref 0.0–40.0)

## 2023-12-28 LAB — MICROALBUMIN / CREATININE URINE RATIO
Creatinine,U: 43.5 mg/dL
Microalb Creat Ratio: 25.6 mg/g (ref 0.0–30.0)
Microalb, Ur: 1.1 mg/dL (ref 0.0–1.9)

## 2023-12-28 LAB — VITAMIN B12: Vitamin B-12: 795 pg/mL (ref 211–911)

## 2023-12-28 LAB — TSH: TSH: 5.83 u[IU]/mL — ABNORMAL HIGH (ref 0.35–5.50)

## 2023-12-28 LAB — HEMOGLOBIN A1C: Hgb A1c MFr Bld: 6.3 % (ref 4.6–6.5)

## 2023-12-28 NOTE — Progress Notes (Signed)
 The test results show that your current treatment is OK, as the tests are stable.  Please continue the same plan.  There is no other need for change of treatment or further evaluation based on these results, at this time.  thanks

## 2023-12-28 NOTE — Patient Instructions (Signed)
 You had the flu shot today  Please remember to call for your eye exam  Ok to try the Nasacort for the congestion  Please call in 1-2 weeks if you change your mind about referral to Dermatology  Please continue all other medications as before, and refills have been done if requested.  Please have the pharmacy call with any other refills you may need.  Please continue your efforts at being more active, low cholesterol diet, and weight control.  You are otherwise up to date with prevention measures today.  Please keep your appointments with your specialists as you may have planned  Please go to the LAB at the blood drawing area for the tests to be done  You will be contacted by phone if any changes need to be made immediately.  Otherwise, you will receive a letter about your results with an explanation, but please check with MyChart first.  Please make an Appointment to return in 6 months, or sooner if needed

## 2023-12-28 NOTE — Assessment & Plan Note (Signed)
 Ok for abreva otc asd

## 2023-12-28 NOTE — Assessment & Plan Note (Signed)
 Last vitamin D  Lab Results  Component Value Date   VD25OH 7.20 (L) 12/28/2023   Low, to start oral replacement

## 2023-12-28 NOTE — Assessment & Plan Note (Signed)
 This is highly suspicous for basal cell ca, likely needs removal but pt declines referral at this time, states she will let us  know in 1-2 wks if the lesion does not improve

## 2023-12-28 NOTE — Assessment & Plan Note (Signed)
 Lab Results  Component Value Date   VITAMINB12 795 12/28/2023   Stable, cont oral replacement - b12 1000 mcg qd

## 2023-12-28 NOTE — Assessment & Plan Note (Addendum)
,   Lab Results  Component Value Date   HGBA1C 6.3 12/28/2023   Stable, pt to continue current medical treatment lantus  35 u every day,  Ckd3a Lab Results  Component Value Date   CREATININE 1.92 (H) 12/28/2023   Stable overall, cont to avoid nephrotoxins

## 2023-12-28 NOTE — Assessment & Plan Note (Signed)
 Age and sex appropriate education and counseling updated with regular exercise and diet Referrals for preventative services - pt to call for eye exam soon Immunizations addressed - for flu shot today Smoking counseling  - none needed Evidence for depression or other mood disorder - none significant Most recent labs reviewed. I have personally reviewed and have noted: 1) the patient's medical and social history 2) The patient's current medications and supplements 3) The patient's height, weight, and BMI have been recorded in the chart

## 2023-12-28 NOTE — Assessment & Plan Note (Signed)
 Lab Results  Component Value Date   LDLCALC 32 12/28/2023   Stable, pt to continue current statin lipitor  40 mg qd

## 2023-12-28 NOTE — Progress Notes (Signed)
 Patient ID: Mary Mccarthy, female   DOB: 02/05/1941, 83 y.o.   MRN: [id number]         Chief Complaint:: wellness exam and Medical Management of Chronic Issues (3 month follow up, has a red bump on her right wrist for 2 weeks and it is tender )  , CHF, allergies, right arm skin lesion, cold sore, low vit d, hld, htn       HPI:  Mary Mccarthy is a 83 y.o. female here for wellness exam; pt to call for eye exam soon, for flu shot, o/w up to date                        Also has sleep study for jan 2026.  Due for flu shot  Pt denies chest pain, increased sob or doe, wheezing, orthopnea, PND, increased LE swelling, palpitations, dizziness or syncope.   Pt denies polydipsia, polyuria, or new focal neuro s/s.    Pt denies fever, wt loss, night sweats, loss of appetite, or other constitutional symptoms  Does have several wks ongoing nasal allergy symptoms with clearish congestion, itch and sneezing, without fever, pain, ST, cough, swelling or wheezing.  Has non healing raised non healing slight tender lesion to post right distal arm near the wrist.  Has had what sound like viral URI last wk improved, but with cold sore evident right upper lip.     Wt Readings from Last 3 Encounters:  12/28/23 184 lb (83.5 kg)  12/17/23 183 lb (83 kg)  12/10/23 184 lb 6.4 oz (83.6 kg)   BP Readings from Last 3 Encounters:  12/28/23 122/82  12/17/23 138/75  12/10/23 (!) 147/54   Immunization History  Administered Date(s) Administered   Fluad Quad(high Dose 65+) 12/26/2018, 12/06/2019, 10/13/2020   INFLUENZA, HIGH DOSE SEASONAL PF 11/19/2012, 11/18/2013, 02/18/2015, 12/28/2015, 12/07/2016, 10/17/2017, 12/28/2023   Influenza Split 12/21/2010, 12/28/2011   Influenza Whole 12/01/2005, 03/26/2009, 12/07/2009   PFIZER(Purple Top)SARS-COV-2 Vaccination 05/01/2019, 05/26/2019, 05/12/2020   Pneumococcal Conjugate-13 12/03/2012   Pneumococcal Polysaccharide-23 11/06/2005   Td 09/24/2008   Tdap  09/13/2018, 09/13/2018   Health Maintenance Due  Topic Date Due   OPHTHALMOLOGY EXAM  07/16/2019   FOOT EXAM  09/25/2023      Past Medical History:  Diagnosis Date   ALLERGIC RHINITIS 06/24/2007   Anemia    AVM (arteriovenous malformation) of colon    Blood transfusion without reported diagnosis 05/2015   COLONIC POLYPS, HX OF 06/24/2007   Coronary artery disease, non-occlusive 05/2015   Trop + w/ Acute Anemia =>CATH: small RI - Ostial 60%, ostial RCA 30% and dLAD 40-50%;; 9/'21: Cor Ca Score 624. Mild (25-49%) prox RCA & LAD,; Moderate (50-69%) Ostial Small RI & prox LCx.     DEPRESSION 10/08/2006   DIABETES MELLITUS, TYPE II 10/05/2006   GERD 06/24/2007   History of CVA (cerebrovascular accident) 08/10/2020   08/2020- found to be in afib with RVR, started on Eliquis  (shower of emboli from A. fib as complication of severe AS)   HYPERLIPIDEMIA 10/08/2006   HYPERTENSION 10/08/2006   INSOMNIA 09/24/2008   Left knee DJD    Nonsustained paroxysmal ventricular tachycardia (HCC) 10/2018   Post-TAVR Zio Patch: Frequent (206) runs of NSVT - Fastest 5 beats 239 bpm. Longest 9.3 Sec avg 135 bpm. (? possibly Afib w/ aberrancy)   Osteoporosis 03/10/2016   PAF (paroxysmal atrial fibrillation) (HCC)    PAF with RVR: CHA2DS2-VASc 9.  On Eliquis  and amiodarone .  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 Verlin & Dr. Lucas   Severe aortic stenosis 08/2020   Progression from Mod-Severe AS to Severe AS - noted on Echo 08/2020 -> (mean gradient progressed from 34 to 42 mmHg) this was in setting of new onset A. fib RVR, acute diastolic HF and shower of emboli CVA. ->  Referred for TAVR, completed 10/26/2020   Past Surgical History:  Procedure Laterality Date   APPENDECTOMY     BIOPSY  10/21/2020   Procedure: BIOPSY;  Surgeon: Federico Rosario BROCKS, MD;  Location: [facility]  ENDOSCOPY;  Service: Gastroenterology;;   BREAST BIOPSY     CARDIAC CATHETERIZATION N/A 05/21/2015   Procedure: Left Heart Cath and Coronary Angiography;  Surgeon: Alm LELON Clay, MD;  Location: [facility] INVASIVE CV LAB;  Service: Cardiovascular;: Ost RI 60%, Ost RCA 30%, dLAD tapers to small vessel w/ 40-50%. Mildly elevated LVEDP. Normal LV Fxn.   COLONOSCOPY N/A 05/20/2015   Procedure: COLONOSCOPY;  Surgeon: Elspeth Deward Naval, MD;  Location: [facility] ENDOSCOPY;  Service: Gastroenterology;  Laterality: N/A;   CORONARY CA2+ SCORE / CARDIAC CT ANGIOGRAM  10/09/2019   Calcium  score 624.  82nd percentile. Dominant RCA: Mild (25-49%) proximal stenosis-distal bifurcation into PDA and PAV--< RPL branches.  LAD (1 major mid vessel diagonal) diffuse calcified plaque, mild proximal stenosis with minimal distal stenosis.  Small RI moderate ostial disease.  LCx-moderate mixed (50-69%) proximal stenosis.  Small dOM1 disease.  Trileaflet AoV, annular Ca2+ - probable AS   ESOPHAGOGASTRODUODENOSCOPY N/A 05/20/2015   Procedure: ESOPHAGOGASTRODUODENOSCOPY (EGD);  Surgeon: Elspeth Deward Naval, MD;  Location: [facility] ENDOSCOPY;  Service: Gastroenterology;  Laterality: N/A;   ESOPHAGOGASTRODUODENOSCOPY (EGD) WITH PROPOFOL  N/A 10/21/2020   Procedure: ESOPHAGOGASTRODUODENOSCOPY (EGD) WITH PROPOFOL ;  Surgeon: Federico Rosario BROCKS, MD;  Location: [facility] ENDOSCOPY;  Service: Gastroenterology;  Laterality: N/A;   INTRAOPERATIVE TRANSTHORACIC ECHOCARDIOGRAM N/A 10/26/2020   Procedure: INTRAOPERATIVE TRANSTHORACIC ECHOCARDIOGRAM;  Surgeon: Verlin; Location: [facility] OR; Pre-TAVR well-visualized calcified AoV mean Grad 37 mmHg, AVA 0.72 cm => Post TAVR well-positioned supra-annular 26 mm Medtronic Evolut Pro valve placed with no PVL.  Mean gradient 14 mmHg.  AVR 1.58 cm.  Normal flow to RCA and LM-LAD.  ; EF 60 to 65%.  Degenerative severe MAC w/ mild MR.   RIGHT/LEFT HEART CATH AND CORONARY ANGIOGRAPHY N/A 09/29/2020   Procedure: RIGHT/LEFT HEART  CATH AND CORONARY ANGIOGRAPHY;  Surgeon: Verlin Lonni BIRCH, MD;  Location: [facility] INVASIVE CV LAB;  Service: Cardiovascular; pre-TAVR:  pRCA 20%, m-dRCA 30%. D LM-pLAD 20%. RI 30%, mLAD 20%.  Severe AS with mean gradient measured at 52.8 mmHg.   TONSILLECTOMY     TRANSCATHETER AORTIC VALVE REPLACEMENT, TRANSFEMORAL N/A 10/26/2020   Procedure: TRANSCATHETER AORTIC VALVE REPLACEMENT, TRANSFEMORAL;  Surgeon: Verlin Lonni BIRCH, MD;  Location: [facility] OR;  Service: Open Heart Surgery;; Medtronic Evolut-Pro + (size 26 mm, model # EVPROPLUS -26US, serial # [id number]); transient high-grade AV block noted so PPM left in place.   TRANSTHORACIC ECHOCARDIOGRAM  03/17/2019   a) 03/2019: EF 60 to 65%.  Moderate LVH.  GRII DD.  Mod-Severe AS (m grad 36 mmHg, peak 59 mmHg); b) 09/2019: EF 65-70%, No RWMA. Gr1 DD/hi LAP, Mild hi PAP. Mod LA Dil. MOD AS (mean Grad 34.5 mmHg).  STABLE   TRANSTHORACIC ECHOCARDIOGRAM  08/27/2020   Admitted for CVA/Afib RVR & CHF (Progression to SEVERE  SYMPTOMATIC AS):  Severe Calcific Aortic Stenosis (VTI AVA estimated 0.86 cm, mean gradient 42 mmHg, V-max 4.36 m/s).  EF 55 to 60%.  Severe concentric LVH.  Unable to determine diastolic parameters.  Moderately elevated PAP.  Mild LA dilation.  Mild circumferential pericardial effusion.  Trivial MR.  Severe MAC.   TRANSTHORACIC ECHOCARDIOGRAM  11/25/2020   1 Month s/p TAVR: EF 55 to 60%.  No R WMA.  GR 1 DD.  Elevated LVEDP.  Normal RV size with mildly elevated RVP estimated 44 mmHg.  Oscillating density in the RV suspect calcified chordal apparatus versus calcified thrombus.  Moderate LA dilation.  Normal IVC/RA P => Well-positioned 26 mm Medtronic Evolut Pro THVwith mean AOV gradient 11 mmHg.  Trivial PVL   TRANSTHORACIC ECHOCARDIOGRAM  11/02/2020   a) Day 1 Post-op TAVR 10/27/20:  Well-Positioned Supra Annular Medtronic Evolut Pro THP.  Mean gradient 12 mmHg, peak 24 mmHg.  AVA 1.8 cm.  Trivial PVL.  EF 60 to 65%.  Normal LV function.  Mild  LVH.  Severe LA dilation.  Mild RA dilation.  Severe MAC;; b) 11/02/20: Normal structure and function of the aortic valve prosthesis.  Mean gradient 9 mmHg (otherwise stable)   TUBAL LIGATION      reports that she has never smoked. She has been exposed to tobacco smoke. She has never used smokeless tobacco. She reports that she does not drink alcohol and does not use drugs. family history includes Alcohol abuse in an other family member; Arrhythmia in her sister; Arthritis in an other family member; CAD in her sister; Cancer in her mother; Depression in an other family member; Diabetes in an other family member; Heart attack (age of onset: 32) in her sister; Heart disease in an other family member; Hyperlipidemia in an other family member; Hypertension in an other family member; Stroke in an other family member. Allergies  Allergen Reactions   Ibuprofen Other (See Comments)    Bleeding events   Amiodarone  Other (See Comments)    Lung toxicity   Current Outpatient Medications on File Prior to Visit  Medication Sig Dispense Refill   acetaminophen  (TYLENOL ) 500 MG tablet Take 500 mg by mouth every 6 (six) hours as needed for moderate pain.     albuterol  (VENTOLIN  HFA) 108 (90 Base) MCG/ACT inhaler Inhale 1-2 puffs into the lungs every 6 (six) hours as needed for wheezing or shortness of breath. 8 g 2   ALPRAZolam  (XANAX ) 0.25 MG tablet TAKE 1 TABLET BY MOUTH 3 TIMES DAILY AS NEEDED FOR ANXIETY. 90 tablet 2   atorvastatin  (LIPITOR ) 40 MG tablet TAKE 1 TABLET BY MOUTH EVERY DAY 90 tablet 3   cyanocobalamin  (VITAMIN B12) 1000 MCG tablet TAKE 1 TABLET BY MOUTH EVERY DAY 90 tablet 2   glucose blood (ACCU-CHEK GUIDE TEST) test strip Use as instructed twice per day E11.9 200 each 12   hydrALAZINE  (APRESOLINE ) 100 MG tablet Take 1 tablet (100 mg total) by mouth 3 (three) times daily. 270 tablet 3   insulin  glargine (LANTUS  SOLOSTAR) 100 UNIT/ML Solostar Pen Inject 35 Units into the skin daily. 15 mL 11    insulin  glargine-yfgn (SEMGLEE ) 100 UNIT/ML Pen SMARTSIG:30 Unit(s) SUB-Q Daily     Insulin  Pen Needle (TECHLITE PEN NEEDLES) 32G X 4 MM MISC Use as directed once daily with insulin  shot 100 each 3   isosorbide  mononitrate (IMDUR ) 60 MG 24 hr tablet Take 1 tablet (60 mg total) by mouth daily. 90 tablet 3   levothyroxine  (SYNTHROID )  25 MCG tablet TAKE 1 TABLET BY MOUTH EVERY DAY 90 tablet 2   metoprolol  succinate (TOPROL -XL) 25 MG 24 hr tablet Take 0.5 tablets (12.5 mg total) by mouth daily. 45 tablet 3   Miconazole Nitrate (MICONAZOLE 3 COMBO PACK VA) Place 1 application  vaginally at bedtime. (Patient not taking: Reported on 12/17/2023)     mirtazapine  (REMERON ) 15 MG tablet TAKE 1 TABLET BY MOUTH EVERYDAY AT BEDTIME 90 tablet 3   pantoprazole  (PROTONIX ) 40 MG tablet TAKE 1 TABLET BY MOUTH TWICE A DAY 180 tablet 1   solifenacin  (VESICARE ) 5 MG tablet Take 1 tablet (5 mg total) by mouth daily. 90 tablet 3   torsemide  (DEMADEX ) 20 MG tablet Take 2 tablets (40 mg total) by mouth daily. 180 tablet 3   No current facility-administered medications on file prior to visit.        ROS:  All others reviewed and negative.  Objective        PE:  BP 122/82 (BP Location: Left Arm, Patient Position: Sitting, Cuff Size: Normal)   Pulse 67   Temp (!) 97.5 F (36.4 C) (Oral)   Ht 5' 1 (1.549 m)   Wt 184 lb (83.5 kg)   SpO2 99%   BMI 34.77 kg/m                 Constitutional: Pt appears in NAD               HENT: Head: NCAT.                Right Ear: External ear normal.                 Left Ear: External ear normal.                Eyes: . Pupils are equal, round, and reactive to light. Conjunctivae and EOM are normal               Nose: without d/c or deformity               Neck: Neck supple. Gross normal ROM               Cardiovascular: Normal rate and regular rhythm.                 Pulmonary/Chest: Effort normal and breath sounds without rales or wheezing.                Abd:  Soft, NT, ND,  + BS, no organomegaly               Neurological: Pt is alert. At baseline orientation, motor grossly intact               Skin: Skin is warm. No rashes, right upper lip with cold sores LE edema - trace left ankle edema; right distal posterior arm near wrist with 1 cm raised slight tender erythem lesion with heaped edges and central ulceration               Psychiatric: Pt behavior is normal without agitation   Micro: none  Cardiac tracings I have personally interpreted today:  none  Pertinent Radiological findings (summarize): none   Lab Results  Component Value Date   WBC 5.2 12/28/2023   HGB 10.2 (L) 12/28/2023   HCT 29.7 (L) 12/28/2023   PLT 127.0 (L) 12/28/2023   GLUCOSE 84 12/28/2023   CHOL 72 12/28/2023   TRIG 74.0 12/28/2023  HDL 25.80 (L) 12/28/2023   LDLCALC 32 12/28/2023   ALT 12 12/28/2023   AST 18 12/28/2023   NA 140 12/28/2023   K 3.5 12/28/2023   CL 103 12/28/2023   CREATININE 1.92 (H) 12/28/2023   BUN 40 (H) 12/28/2023   CO2 30 12/28/2023   TSH 5.83 (H) 12/28/2023   INR 1.7 (H) 11/01/2020   HGBA1C 6.3 12/28/2023   MICROALBUR 1.1 12/28/2023   Assessment/Plan:  Alletta Mattos is a 83 y.o. White or Caucasian [1] female with  has a past medical history of ALLERGIC RHINITIS (06/24/2007), Anemia, AVM (arteriovenous malformation) of colon, Blood transfusion without reported diagnosis (05/2015), COLONIC POLYPS, HX OF (06/24/2007), Coronary artery disease, non-occlusive (05/2015), DEPRESSION (10/08/2006), DIABETES MELLITUS, TYPE II (10/05/2006), GERD (06/24/2007), History of CVA (cerebrovascular accident) (08/10/2020), HYPERLIPIDEMIA (10/08/2006), HYPERTENSION (10/08/2006), INSOMNIA (09/24/2008), Left knee DJD, Nonsustained paroxysmal ventricular tachycardia (HCC) (10/2018), Osteoporosis (03/10/2016), PAF (paroxysmal atrial fibrillation) (HCC), PAF with RVR: CHA2DS2-VASc 9.  On Eliquis  and amiodarone . (08/11/2020), PEPTIC ULCER DISEASE (10/08/2006), S/P TAVR  (transcatheter aortic valve replacement) (10/26/2020), and Severe aortic stenosis (08/2020).  Encounter for well adult exam with abnormal findings Age and sex appropriate education and counseling updated with regular exercise and diet Referrals for preventative services - pt to call for eye exam soon Immunizations addressed - for flu shot today Smoking counseling  - none needed Evidence for depression or other mood disorder - none significant Most recent labs reviewed. I have personally reviewed and have noted: 1) the patient's medical and social history 2) The patient's current medications and supplements 3) The patient's height, weight, and BMI have been recorded in the chart   CHF (congestive heart failure), NYHA class II, chronic, diastolic (HCC) Stable volume, cont current med tx  Vitamin D  deficiency Last vitamin D  Lab Results  Component Value Date   VD25OH 7.20 (L) 12/28/2023   Low, to start oral replacement   Hyperlipidemia associated with type 2 diabetes mellitus (HCC) Lab Results  Component Value Date   LDLCALC 32 12/28/2023   Stable, pt to continue current statin lipitor  40 mg qd   Essential hypertension BP Readings from Last 3 Encounters:  12/28/23 122/82  12/17/23 138/75  12/10/23 (!) 147/54   Stable, pt to continue medical treatment hydralazine  100 tid, toprol  xl 12.5 qd   Diabetes mellitus with chronic kidney disease (HCC) , Lab Results  Component Value Date   HGBA1C 6.3 12/28/2023   Stable, pt to continue current medical treatment lantus  35 u every day,  Ckd3a Lab Results  Component Value Date   CREATININE 1.92 (H) 12/28/2023   Stable overall, cont to avoid nephrotoxins   B12 deficiency Lab Results  Component Value Date   VITAMINB12 795 12/28/2023   Stable, cont oral replacement - b12 1000 mcg qd   Allergic rhinitis Mild to mod, for add nasacort asd,  to f/u any worsening symptoms or concerns  Skin lesion of right arm This is  highly suspicous for basal cell ca, likely needs removal but pt declines referral at this time, states she will let us  know in 1-2 wks if the lesion does not improve  Cold sore Ok for abreva otc asd  Followup: Return in about 6 months (around 06/26/2024).  Lynwood Rush, MD 12/28/2023 7:53 PM [facility] Internal Medicine

## 2023-12-28 NOTE — Assessment & Plan Note (Signed)
Mild to mod, for add nasacort asd,  to f/u any worsening symptoms or concerns

## 2023-12-28 NOTE — Assessment & Plan Note (Signed)
Stable volume, cont current med tx 

## 2023-12-28 NOTE — Assessment & Plan Note (Signed)
 BP Readings from Last 3 Encounters:  12/28/23 122/82  12/17/23 138/75  12/10/23 (!) 147/54   Stable, pt to continue medical treatment hydralazine  100 tid, toprol  xl 12.5 qd

## 2023-12-31 ENCOUNTER — Other Ambulatory Visit: Payer: Self-pay

## 2023-12-31 ENCOUNTER — Other Ambulatory Visit: Payer: Self-pay | Admitting: Internal Medicine

## 2024-01-13 ENCOUNTER — Other Ambulatory Visit: Payer: Self-pay | Admitting: Internal Medicine

## 2024-01-17 ENCOUNTER — Telehealth: Payer: Self-pay

## 2024-01-17 MED ORDER — ALPRAZOLAM 0.25 MG PO TABS: 0.2500 mg | ORAL_TABLET | Freq: Three times a day (TID) | ORAL | 2 refills | Status: AC | PRN

## 2024-01-17 NOTE — Telephone Encounter (Signed)
 Ok I have resent this medication    thanks

## 2024-01-17 NOTE — Telephone Encounter (Signed)
 Copied from CRM #[id number]. Topic: Clinical - Prescription Issue >> Jan 17, 2024  1:33 PM Lauren C wrote: Reason for CRM: Pt calling regarding alprazolam  refill request. It was sent in on the 8th. Pt says she has called since the 8th and they said they did not have this medication for her and that they were unable to request it. Please return patients call at [phone]. I attempted to call pharmacy to confirm but they were closed for lunch.  [id number] [address], [place] - [address] AT [facility] [address] [place] [address] Phone: [phone] Fax: [phone]

## 2024-01-17 NOTE — Addendum Note (Signed)
 Addended by: [name] on: 01/17/2024 04:57 PM   Modules accepted: Orders

## 2024-01-23 ENCOUNTER — Other Ambulatory Visit

## 2024-01-23 NOTE — Patient Instructions (Signed)
 Visit Information  Thank you for taking time to visit with me today. Please don't hesitate to contact me if I can be of assistance to you before our next scheduled appointment.  Your next care management appointment is by telephone on 02/20/24 at 3:00 pm.   Please call the care guide team at [phone] if you need to cancel, schedule, or reschedule an appointment.   Please call the Suicide and Crisis Lifeline: 988 call the [place]  National Suicide Prevention Lifeline: (262) 190-4731 or TTY: 587-414-1391 TTY (229)301-8704) to talk to a trained counselor if you are experiencing a Mental Health or Behavioral Health Crisis or need someone to talk to.  Heddy Shutter, RN, MSN, BSN, CCM [facility], Population Health Case Manager Phone: [phone]

## 2024-01-23 NOTE — Patient Outreach (Signed)
 Complex Care Management   Visit Note  01/23/2024  Name:  Mary Mccarthy MRN: [id number] DOB: Jun 15, 1940  Situation: Referral received for Complex Care Management related to Heart Failure I obtained verbal consent from Patient.  Visit completed with Patient  on the phone  Background:   Past Medical History:  Diagnosis Date   ALLERGIC RHINITIS 06/24/2007   Anemia    AVM (arteriovenous malformation) of colon    Blood transfusion without reported diagnosis 05/2015   COLONIC POLYPS, HX OF 06/24/2007   Coronary artery disease, non-occlusive 05/2015   Trop + w/ Acute Anemia =>CATH: small RI - Ostial 60%, ostial RCA 30% and dLAD 40-50%;; 9/'21: Cor Ca Score 624. Mild (25-49%) prox RCA & LAD,; Moderate (50-69%) Ostial Small RI & prox LCx.     DEPRESSION 10/08/2006   DIABETES MELLITUS, TYPE II 10/05/2006   GERD 06/24/2007   History of CVA (cerebrovascular accident) 08/10/2020   08/2020- found to be in afib with RVR, started on Eliquis  (shower of emboli from A. fib as complication of severe AS)   HYPERLIPIDEMIA 10/08/2006   HYPERTENSION 10/08/2006   INSOMNIA 09/24/2008   Left knee DJD    Nonsustained paroxysmal ventricular tachycardia (HCC) 10/2018   Post-TAVR Zio Patch: Frequent (206) runs of NSVT - Fastest 5 beats 239 bpm. Longest 9.3 Sec avg 135 bpm. (? possibly Afib w/ aberrancy)   Osteoporosis 03/10/2016   PAF (paroxysmal atrial fibrillation) (HCC)    PAF with RVR: CHA2DS2-VASc 9.  On Eliquis  and amiodarone . 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 Verlin & Dr. Lucas   Severe aortic stenosis 08/2020   Progression from Mod-Severe AS to Severe AS - noted on Echo 08/2020 -> (mean gradient progressed from 34 to 42 mmHg) this was in setting of new onset A. fib RVR, acute diastolic HF and shower of emboli CVA. ->  Referred for TAVR, completed  10/26/2020    Assessment: Patient Reported Symptoms:  Cognitive Cognitive Status: Requires Assistance Decision Making, No symptoms reported, Alert and oriented to person, place, and time      Neurological Neurological Review of Symptoms: No symptoms reported    HEENT HEENT Symptoms Reported: No symptoms reported      Cardiovascular Cardiovascular Symptoms Reported: No symptoms reported Weight: 184 lb 9.6 oz (83.7 kg)  Respiratory Respiratory Symptoms Reported: No symptoms reported Other Respiratory Symptoms: Patient denies any respiratory concerns at this time.    Endocrine Endocrine Symptoms Reported: No symptoms reported Is patient diabetic?: Yes Is patient checking blood sugars at home?: Yes List most recent blood sugar readings, include date and time of day: BS 146 this morning fasting. patient reports she ate a little brownie about 9pm.    Gastrointestinal Gastrointestinal Symptoms Reported: No symptoms reported      Genitourinary Genitourinary Symptoms Reported: Frequency Other Genitourinary Symptoms: patient takes diuretic Genitourinary Management Strategies: Incontinence garment/pad  Integumentary Integumentary Symptoms Reported: No symptoms reported Additional Integumentary Details: reports area on her right wrist area it came up like a pimple on my arm. patient reports she feels like it is getting better, but states her son does not think it is better. Patient states would llike referral to dermatologist. Skin Management Strategies: Routine screening Skin Self-Management Outcome: 3 (uncertain)  Musculoskeletal Musculoskelatal Symptoms Reviewed: No symptoms reported Additional Musculoskeletal Details: ambulates with walker.  Psychosocial Psychosocial Symptoms Reported: No symptoms reported         Today's Vitals   01/23/24 1356  BP: (!) 155/61  Pulse: 67  Weight: 184 lb 9.6 oz (83.7 kg)     Medications Reviewed Today     Reviewed by Anuel Sitter  M, RN (Registered Nurse) on 01/23/24 at 1356  Med List Status: <None>   Medication Order Taking? Sig Documenting Provider Last Dose Status Informant  acetaminophen  (TYLENOL ) 500 MG tablet [id number] Yes Take 500 mg by mouth every 6 (six) hours as needed for moderate pain. [provider]  Active Self, Child, Pharmacy Records           Med Note ROLENE RODRIGUEZ, [name]   Mon May 14, 2023  2:52 PM)    albuterol  (VENTOLIN  HFA) 108 [phone] Base) MCG/ACT inhaler [id number] Yes Inhale 1-2 puffs into the lungs every 6 (six) hours as needed for wheezing or shortness of breath. Cobb, Comer GAILS, NP  Active   ALPRAZolam  (XANAX ) 0.25 MG tablet [id number] Yes Take 1 tablet (0.25 mg total) by mouth 3 (three) times daily as needed for anxiety. [name], MD  Active   atorvastatin  (LIPITOR ) 40 MG tablet [id number] Yes TAKE 1 TABLET BY MOUTH EVERY DAY [name], MD  Active   cyanocobalamin  (VITAMIN B12) 1000 MCG tablet [id number] Yes TAKE 1 TABLET BY MOUTH EVERY DAY [name], MD  Active   glucose blood (ACCU-CHEK GUIDE TEST) test strip [id number]  Use as instructed twice per day E11.9 [name], MD  Active   glucose blood [facility] VERIO) test strip [id number]  USE IN THE MORNING, AT NOON, AND AT BEDTIME. [name], MD  Active   hydrALAZINE  (APRESOLINE ) 100 MG tablet [id number] Yes Take 1 tablet (100 mg total) by mouth 3 (three) times daily. [name], MD  Active   insulin  glargine (LANTUS  SOLOSTAR) 100 UNIT/ML Solostar Pen [id number] Yes Inject 35 Units into the skin daily. [name], MD  Active   insulin  glargine-yfgn (SEMGLEE ) 100 UNIT/ML Pen [id number]  SMARTSIG:30 Unit(s) SUB-Q Daily  Patient not taking: Reported on 01/23/2024   [provider]  Active   Insulin  Pen Needle (TECHLITE PEN NEEDLES) 32G X 4 MM MISC [id number]  Use as directed once daily with insulin  shot [name], MD  Active   isosorbide  mononitrate (IMDUR ) 60 MG 24 hr tablet [id number] Yes Take 1  tablet (60 mg total) by mouth daily. [name], MD  Active   levothyroxine  (SYNTHROID ) 25 MCG tablet [id number] Yes TAKE 1 TABLET BY MOUTH EVERY DAY [name], MD  Active   metoprolol  succinate (TOPROL -XL) 25 MG 24 hr tablet [id number] Yes Take 0.5 tablets (12.5 mg total) by mouth daily. [name], MD  Active   Miconazole Nitrate (MICONAZOLE 3 COMBO PACK VA) [id number]  Place 1 application  vaginally at bedtime.  Patient not taking: Reported on 01/23/2024   [provider]  Active   mirtazapine  (REMERON ) 15 MG tablet [id number] Yes TAKE 1 TABLET BY MOUTH EVERYDAY AT BEDTIME [name], MD  Active   pantoprazole  (PROTONIX ) 40 MG tablet [id number] Yes TAKE 1 TABLET BY MOUTH TWICE A DAY Sebastian Lamarr SAUNDERS, PA-C  Active Self, Child, Pharmacy Records  solifenacin  (VESICARE ) 5 MG tablet [id number] Yes Take 1 tablet (5 mg total) by mouth daily. [name], MD  Active Self, Child, Pharmacy Records  torsemide  (DEMADEX ) 20 MG tablet [id number] Yes Take 2  tablets (40 mg total) by mouth daily. [name], MD  Active           Recommendation:   Continue Current Plan of Care  Follow Up Plan:   Telephone follow up appointment date/time:  02/20/24 at 3:00 pm  Heddy Shutter, RN, MSN, BSN, CCM [place]  [facility], Population Health Case Manager Phone: [phone]

## 2024-01-25 ENCOUNTER — Telehealth: Payer: Self-pay | Admitting: Cardiology

## 2024-01-25 NOTE — Telephone Encounter (Signed)
 [employer] called on behalf of the patient to see if patient could receive a portable oxygen  tank concentrator. She stated [employer] would be the company that could assist once the order is written. Telephone number [phone] and fax number [phone] please advise

## 2024-01-25 NOTE — Telephone Encounter (Signed)
 Attempted to call [employer] and return call. Waited on the phone for 15 minutes. Finally got a person and was unable to assist, transfer me, or give any information. Was going to update them to reach out to pt pulmonologist for this order. Will update them when they call back.

## 2024-02-20 ENCOUNTER — Other Ambulatory Visit: Payer: Self-pay

## 2024-02-20 NOTE — Patient Outreach (Signed)
 Complex Care Management   Visit Note  02/20/2024  Name:  Mary Mccarthy MRN: [id number] DOB: 1941/01/01  Situation: Referral received for Complex Care Management related to Heart Failure I obtained verbal consent from Patient.  Visit completed with Patient  on the phone  Background:   Past Medical History:  Diagnosis Date   ALLERGIC RHINITIS 06/24/2007   Anemia    AVM (arteriovenous malformation) of colon    Blood transfusion without reported diagnosis 05/2015   COLONIC POLYPS, HX OF 06/24/2007   Coronary artery disease, non-occlusive 05/2015   Trop + w/ Acute Anemia =>CATH: small RI - Ostial 60%, ostial RCA 30% and dLAD 40-50%;; 9/'21: Cor Ca Score 624. Mild (25-49%) prox RCA & LAD,; Moderate (50-69%) Ostial Small RI & prox LCx.     DEPRESSION 10/08/2006   DIABETES MELLITUS, TYPE II 10/05/2006   GERD 06/24/2007   History of CVA (cerebrovascular accident) 08/10/2020   08/2020- found to be in afib with RVR, started on Eliquis  (shower of emboli from A. fib as complication of severe AS)   HYPERLIPIDEMIA 10/08/2006   HYPERTENSION 10/08/2006   INSOMNIA 09/24/2008   Left knee DJD    Nonsustained paroxysmal ventricular tachycardia (HCC) 10/2018   Post-TAVR Zio Patch: Frequent (206) runs of NSVT - Fastest 5 beats 239 bpm. Longest 9.3 Sec avg 135 bpm. (? possibly Afib w/ aberrancy)   Osteoporosis 03/10/2016   PAF (paroxysmal atrial fibrillation) (HCC)    PAF with RVR: CHA2DS2-VASc 9.  On Eliquis  and amiodarone . 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 Verlin & Dr. Lucas   Severe aortic stenosis 08/2020   Progression from Mod-Severe AS to Severe AS - noted on Echo 08/2020 -> (mean gradient progressed from 34 to 42 mmHg) this was in setting of new onset A. fib RVR, acute diastolic HF and shower of emboli CVA. ->  Referred for TAVR, completed  10/26/2020    Assessment: Patient Reported Symptoms:  Cognitive Cognitive Status: No symptoms reported, Alert and oriented to person, place, and time      Neurological Neurological Review of Symptoms: No symptoms reported    HEENT HEENT Symptoms Reported: No symptoms reported      Cardiovascular Cardiovascular Symptoms Reported: No symptoms reported    Respiratory Respiratory Symptoms Reported: No symptoms reported, Shortness of breath Other Respiratory Symptoms: Oxygen  at 3l/[place] as needed. oxygen  saturation was 95% RA.    Endocrine Endocrine Symptoms Reported: No symptoms reported Is patient diabetic?: Yes Is patient checking blood sugars at home?: Yes List most recent blood sugar readings, include date and time of day: BS 88 this am. Patient denies any low blood sugars within the past month.    Gastrointestinal Gastrointestinal Symptoms Reported: No symptoms reported      Genitourinary Genitourinary Symptoms Reported: No symptoms reported    Integumentary Integumentary Symptoms Reported: No symptoms reported Additional Integumentary Details: skin dry    Musculoskeletal Musculoskelatal Symptoms Reviewed: Unsteady gait Additional Musculoskeletal Details: ambulates with walker when outside the house Musculoskeletal Management Strategies: Medication therapy, Medical device, Adequate rest, Routine screening Falls in the past year?: No    Psychosocial Psychosocial Symptoms Reported: No symptoms reported        There were no vitals filed for this visit. Pain Scale: 0-10 Pain Score: 0-No pain  Medications Reviewed Today     Reviewed by Kaliel Bolds M,  RN (Registered Nurse) on 02/20/24 at 1516  Med List Status: <None>   Medication Order Taking? Sig Documenting Provider Last Dose Status Informant  acetaminophen  (TYLENOL ) 500 MG tablet [id number] Yes Take 500 mg by mouth every 6 (six) hours as needed for moderate pain. [provider]  Active Self, Child, Pharmacy  Records           Med Note ROLENE RODRIGUEZ, [name]   Mon May 14, 2023  2:52 PM)    albuterol  (VENTOLIN  HFA) 108 [phone] Base) MCG/ACT inhaler [id number] Yes Inhale 1-2 puffs into the lungs every 6 (six) hours as needed for wheezing or shortness of breath. Malachy Comer GAILS, NP  Active   ALPRAZolam  (XANAX ) 0.25 MG tablet [id number] Yes Take 1 tablet (0.25 mg total) by mouth 3 (three) times daily as needed for anxiety. [name], MD  Active   atorvastatin  (LIPITOR ) 40 MG tablet [id number] Yes TAKE 1 TABLET BY MOUTH EVERY DAY [name], MD  Active   cyanocobalamin  (VITAMIN B12) 1000 MCG tablet [id number] Yes TAKE 1 TABLET BY MOUTH EVERY DAY [name], MD  Active   glucose blood (ACCU-CHEK GUIDE TEST) test strip [id number]  Use as instructed twice per day E11.9 [name], MD  Active   glucose blood [facility] VERIO) test strip [id number]  USE IN THE MORNING, AT NOON, AND AT BEDTIME. [name], MD  Active   hydrALAZINE  (APRESOLINE ) 100 MG tablet [id number] Yes Take 1 tablet (100 mg total) by mouth 3 (three) times daily. [name], MD  Active   insulin  glargine (LANTUS  SOLOSTAR) 100 UNIT/ML Solostar Pen [id number] Yes Inject 35 Units into the skin daily. [name], MD  Active   insulin  glargine-yfgn (SEMGLEE ) 100 UNIT/ML Pen [id number]  SMARTSIG:30 Unit(s) SUB-Q Daily  Patient not taking: Reported on 02/20/2024   [provider]  Active   Insulin  Pen Needle (TECHLITE PEN NEEDLES) 32G X 4 MM MISC [id number]  Use as directed once daily with insulin  shot [name], MD  Active   isosorbide  mononitrate (IMDUR ) 60 MG 24 hr tablet [id number] Yes Take 1 tablet (60 mg total) by mouth daily. [name], MD  Active   levothyroxine  (SYNTHROID ) 25 MCG tablet [id number] Yes TAKE 1 TABLET BY MOUTH EVERY DAY [name], MD  Active   metoprolol  succinate (TOPROL -XL) 25 MG 24 hr tablet [id number] Yes Take 0.5 tablets (12.5 mg total) by mouth daily. [name], MD  Active    Miconazole Nitrate (MICONAZOLE 3 COMBO PACK VA) [id number]  Place 1 application  vaginally at bedtime.  Patient not taking: Reported on 02/20/2024   [provider]  Active   mirtazapine  (REMERON ) 15 MG tablet [id number] Yes TAKE 1 TABLET BY MOUTH EVERYDAY AT BEDTIME [name], MD  Active   pantoprazole  (PROTONIX ) 40 MG tablet [id number] Yes TAKE 1 TABLET BY MOUTH TWICE A DAY Sebastian Lamarr SAUNDERS, PA-C  Active Self, Child, Pharmacy Records  solifenacin  (VESICARE ) 5 MG tablet [id number] Yes Take 1 tablet (5 mg total) by mouth daily. [name], MD  Active Self, Child, Pharmacy Records  torsemide  (DEMADEX ) 20 MG tablet [id number] Yes Take 2 tablets (40 mg total) by mouth daily. [name], MD  Active           Recommendation:   Continue Current Plan of Care  Follow Up Plan:   Telephone follow up appointment date/time:  03/21/24  Heddy Shutter, RN, MSN, BSN, CCM [facility]  [facility], Population Health Case Manager Phone: [phone]

## 2024-02-20 NOTE — Patient Instructions (Signed)
 Visit Information  Thank you for taking time to visit with me today. Please don't hesitate to contact me if I can be of assistance to you before our next scheduled appointment.  Your next care management appointment is by telephone on 03/21/24 at 10:00 am  Please call the care guide team at [phone] if you need to cancel, schedule, or reschedule an appointment.   Please call the Suicide and Crisis Lifeline: 988 call the [place]  National Suicide Prevention Lifeline: (731)683-1584 or TTY: 423-121-3800 TTY 2041209221) to talk to a trained counselor if you are experiencing a Mental Health or Behavioral Health Crisis or need someone to talk to.  Heddy Shutter, RN, MSN, BSN, CCM [place]  [facility], Population Health Case Manager Phone: [phone]

## 2024-02-21 ENCOUNTER — Ambulatory Visit (HOSPITAL_BASED_OUTPATIENT_CLINIC_OR_DEPARTMENT_OTHER): Attending: Nurse Practitioner | Admitting: Pulmonary Disease

## 2024-02-21 ENCOUNTER — Telehealth: Payer: Self-pay

## 2024-02-21 DIAGNOSIS — G4733 Obstructive sleep apnea (adult) (pediatric): Secondary | ICD-10-CM | POA: Diagnosis not present

## 2024-02-21 DIAGNOSIS — G4700 Insomnia, unspecified: Secondary | ICD-10-CM | POA: Diagnosis not present

## 2024-02-21 DIAGNOSIS — G4734 Idiopathic sleep related nonobstructive alveolar hypoventilation: Secondary | ICD-10-CM | POA: Diagnosis present

## 2024-02-21 DIAGNOSIS — G4761 Periodic limb movement disorder: Secondary | ICD-10-CM | POA: Diagnosis not present

## 2024-02-21 NOTE — Telephone Encounter (Signed)
 Received fax from [facility] that pt is requesting POC and [employer] is not providing it. Called and spoke to pt, and advised that for a POC we would have to do a walk test at her next OV.  Pt is scheduled with Dr. Theophilus on 02/26/23. A note was added to the appt that pt is wanting to qualify for POC. Pt verbalized understanding, NFN.

## 2024-02-26 ENCOUNTER — Ambulatory Visit

## 2024-02-26 ENCOUNTER — Ambulatory Visit: Admitting: Pulmonary Disease

## 2024-02-26 ENCOUNTER — Encounter: Payer: Self-pay | Admitting: Pulmonary Disease

## 2024-02-26 ENCOUNTER — Encounter

## 2024-02-26 VITALS — BP 123/72 | HR 65 | Ht 61.0 in | Wt 183.0 lb

## 2024-02-26 DIAGNOSIS — J849 Interstitial pulmonary disease, unspecified: Secondary | ICD-10-CM | POA: Diagnosis not present

## 2024-02-26 DIAGNOSIS — J704 Drug-induced interstitial lung disorders, unspecified: Secondary | ICD-10-CM

## 2024-02-26 DIAGNOSIS — I509 Heart failure, unspecified: Secondary | ICD-10-CM | POA: Diagnosis not present

## 2024-02-26 DIAGNOSIS — J9611 Chronic respiratory failure with hypoxia: Secondary | ICD-10-CM

## 2024-02-26 DIAGNOSIS — T462X5S Adverse effect of other antidysrhythmic drugs, sequela: Secondary | ICD-10-CM | POA: Diagnosis not present

## 2024-02-26 DIAGNOSIS — I480 Paroxysmal atrial fibrillation: Secondary | ICD-10-CM

## 2024-02-26 DIAGNOSIS — Z9981 Dependence on supplemental oxygen: Secondary | ICD-10-CM

## 2024-02-26 LAB — PULMONARY FUNCTION TEST
DL/VA % pred: 61 %
DL/VA: 2.54 ml/min/mmHg/L
DLCO unc % pred: 49 %
DLCO unc: 8.28 ml/min/mmHg
FEF 25-75 Pre: 1.38 L/s
FEF2575-%Pred-Pre: 140 %
FEV1-%Pred-Pre: 102 %
FEV1-Pre: 1.52 L
FEV1FVC-%Pred-Pre: 106 %
FEV6-%Pred-Pre: 103 %
FEV6-Pre: 1.96 L
FEV6FVC-%Pred-Pre: 107 %
FVC-%Pred-Pre: 96 %
FVC-Pre: 1.96 L
Pre FEV1/FVC ratio: 78 %
Pre FEV6/FVC Ratio: 100 %
RV % pred: 95 %
RV: 2.23 L
TLC % pred: 89 %
TLC: 4.13 L

## 2024-02-26 NOTE — Patient Instructions (Signed)
" °  VISIT SUMMARY: Today, we evaluated your oxygen  levels and management for interstitial lung disease. Your breathing has improved, and you are using supplemental oxygen  only when active. Your lung function has improved since your last visit, and your condition is stable.  YOUR PLAN: DRUG-INDUCED INTERSTITIAL LUNG DISEASE: Your lung disease was caused by the medication amiodarone , but your condition has improved since stopping the medication and completing prednisone  treatment. -Continue with your current management as your condition is well-managed.  DEPENDENCE ON SUPPLEMENTAL OXYGEN : You need to use supplemental oxygen  due to your lung disease. -Continue using 3 liters of oxygen  when you are active.  Will check you to qualify for portable concentrator.    "

## 2024-02-26 NOTE — Progress Notes (Signed)
 "              Mary Mccarthy    [id number]    Oct 10, 1940  Primary Care Physician:John, Lynwood ORN, MD  Referring Physician: Malachy Comer GAILS, NP [address] [place],  [place] [address]  Chief complaint: Posthospitalization follow-up for interstitial lung disease, suspected amiodarone  toxicity  HPI: 84 y.o. who  has a past medical history of ALLERGIC RHINITIS (06/24/2007), Anemia, AVM (arteriovenous malformation) of colon, Blood transfusion without reported diagnosis (05/2015), COLONIC POLYPS, HX OF (06/24/2007), Coronary artery disease, non-occlusive (05/2015), DEPRESSION (10/08/2006), DIABETES MELLITUS, TYPE II (10/05/2006), GERD (06/24/2007), History of CVA (cerebrovascular accident) (08/10/2020), HYPERLIPIDEMIA (10/08/2006), HYPERTENSION (10/08/2006), INSOMNIA (09/24/2008), Left knee DJD, Nonsustained paroxysmal ventricular tachycardia (HCC) (10/2018), Osteoporosis (03/10/2016), PAF (paroxysmal atrial fibrillation) (HCC), PAF with RVR: CHA2DS2-VASc 9.  On Eliquis  and amiodarone . (08/11/2020), PEPTIC ULCER DISEASE (10/08/2006), S/P TAVR (transcatheter aortic valve replacement) (10/26/2020), and Severe aortic stenosis (08/2020).  Discussed the use of AI scribe software for clinical note transcription with the patient, who gave verbal consent to proceed.  History of Present Illness Mary Mccarthy is an 84 year old female with heart failure, atrial fibrillation, and chronic kidney disease who presents with shortness of breath and swelling. She is accompanied by her son, Beryl.  She was hospitalized from April 7 to Jun 07, 2023, for acute hypoxic respiratory failure. Previously on two liters of oxygen , she continues to require oxygen  support. During her hospitalization, she was treated with diuretics for suspected heart failure, which helped reduce fluid overload.  Diagnosed with amiodarone  toxicity, pneumonitis and started on prednisone  taper which she completed around  June 2025 with improvement in symptoms. She was also treated with antibiotics in the hospital due to a suspected aspiration event.  Her medical history includes heart failure, atrial fibrillation, and aortic stenosis, for which she underwent aortic valve replacement (TAVR) approximately six years ago. Her chronic kidney disease is stage 3B  Interim history: Mary Mccarthy is an 84 year old female with interstitial lung disease who presents for oxygen  level evaluation and management.  Dyspnea and oxygen  requirement - Breathing has improved since prior visits. - Uses 3 L of supplemental oxygen  with activity. - No oxygen  required while sitting or routinely at night. - Attempting to obtain a portable oxygen  concentrator for improved mobility.  Interstitial lung disease - Previously treated with prednisone  for interstitial lung disease and amiodarone  exposure. - Discontinued prednisone  since June or July 2025. - CT chest in August 2025 showed improved inflammation and scarring, with some residual changes. - Pulmonary function tests have improved since May 2025.  Cardiac arrhythmia - History of atrial fibrillation previously treated with amiodarone . - Currently takes Toprol  XL, half a tablet in the morning. - Not on anticoagulation therapy. - Rare episodes of marked heart rate fluctuation lasting a few hours, then resolving.  Relevant pulmonary history Pets: She has a dog and no other pets. 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  Exposures: No mold, hot tub, Jacuzzi.  No feather pillows or comforters No h/o chemo/XRT/amiodarone /macrodantin/MTX  No exposure to asbestos, silica or other organic allergens  Smoking history: She has never smoked but was exposed to significant secondhand smoke from two husbands who smoked heavily.  Travel history:  No significant travel history Relevant family history:Her family history includes a sister with lung disease from smoking and a mother with  throat cancer from snuff use.   Outpatient Encounter Medications as of 02/26/2024  Medication Sig   acetaminophen  (TYLENOL ) 500 MG tablet Take 500 mg by mouth every 6 (six) hours as needed for moderate pain.   albuterol  (VENTOLIN  HFA) 108 (90 Base) MCG/ACT inhaler Inhale 1-2 puffs into the lungs every 6 (six) hours as needed for wheezing or shortness of breath.   ALPRAZolam  (XANAX ) 0.25 MG tablet Take 1 tablet (0.25 mg total) by mouth 3 (three) times daily as needed for anxiety.   atorvastatin  (LIPITOR ) 40 MG tablet TAKE 1 TABLET BY MOUTH EVERY DAY   glucose blood (ACCU-CHEK GUIDE TEST) test strip Use as instructed twice per day E11.9   glucose blood (ONETOUCH VERIO) test strip USE IN THE MORNING, AT NOON, AND AT BEDTIME.   hydrALAZINE  (APRESOLINE ) 100 MG tablet Take 1 tablet (100 mg total) by mouth 3 (three) times daily.   insulin  glargine (LANTUS  SOLOSTAR) 100 UNIT/ML Solostar Pen Inject 35 Units into the skin daily.   Insulin  Pen Needle (TECHLITE PEN NEEDLES) 32G X 4 MM MISC Use as directed once daily with insulin  shot   isosorbide  mononitrate (IMDUR ) 60 MG 24 hr tablet Take 1 tablet (60 mg total) by mouth daily.   levothyroxine  (SYNTHROID ) 25 MCG tablet TAKE 1 TABLET BY MOUTH EVERY DAY   metoprolol  succinate (TOPROL -XL) 25 MG 24 hr tablet Take 0.5 tablets (12.5 mg total) by mouth daily.   mirtazapine  (REMERON ) 15 MG tablet TAKE 1 TABLET BY MOUTH EVERYDAY AT BEDTIME   pantoprazole  (PROTONIX ) 40 MG tablet TAKE 1 TABLET BY MOUTH TWICE A DAY   solifenacin  (VESICARE ) 5 MG tablet Take 1 tablet (5 mg total) by mouth daily.   torsemide  (DEMADEX ) 20 MG tablet Take 2 tablets (40 mg total) by mouth daily.   cyanocobalamin  (VITAMIN B12) 1000 MCG tablet TAKE 1 TABLET BY MOUTH EVERY DAY (Patient not taking: Reported on 02/26/2024)   insulin  glargine-yfgn (SEMGLEE ) 100  UNIT/ML Pen SMARTSIG:30 Unit(s) SUB-Q Daily (Patient not taking: Reported on 02/26/2024)   Miconazole Nitrate (MICONAZOLE 3 COMBO PACK VA) Place 1 application  vaginally at bedtime. (Patient not taking: Reported on 02/26/2024)   No facility-administered encounter medications on file as of 02/26/2024.   Vitals:   02/26/24 1400  BP: 123/72  Pulse: 65  Height: 5' 1 (1.549 m)  Weight: 183 lb (83 kg)  SpO2: 96%  TempSrc: Oral  BMI (Calculated): 34.6     Physical Exam GEN: No acute distress. CV: Regular rate and rhythm, no murmurs. LUNGS: Clear to auscultation bilaterally, normal respiratory effort. SKIN JOINTS: Warm and dry, no rash.   Data Reviewed: Imaging: Chest x-ray today chest x-ray 05/27/2023-coarse bilateral pulmonary opacities left greater than right CT chest 05/27/2023-diffuse multifocal patchy airspace disease, groundglass opacities which is new compared to earlier this year CT high-resolution 09/19/2023 peripheral peribronchovascular ground glass septal thickening, lobular air trapping.  Improved from earlier this year I have reviewed the images personally  PFTs: 02/26/2024 FVC 1.96 [96%], FEV1 1.52 [102%], F/F78, TLC 4.13 [89%], DLCO 8.28 [49%] Moderately severe diffusion defect  Labs: CTD serologies 06/20/2023-negative Assessment & Plan Drug-induced interstitial lung disease Secondary to amiodarone  use. Improvement noted on follow-up CT scan with reduced inflammation and scarring. Lung function tests show improved lung capacity compared to May 2025. No evidence of autoimmune or tissue disease based on previous blood tests. Some scarring may be permanent, but overall condition is improved since discontinuation of amiodarone  and completion of prednisone  course. - Continue current management as condition  is well-managed.  Dependence on supplemental oxygen  Currently using 3 liters of oxygen  when active.  Did not desat on exertion today and can probably discontinue oxygen   during the daytime and just use at night  Heart failure Paroxysmal atrial fibrillation History of TAVR Follows with Dr. Anner  Goals of Care Discussed prognosis and goals of care. She prefers to be informed about her condition and prognosis, and to avoid aggressive interventions. Currently under palliative and hospice care. Acknowledges age and multiple health issues, desires honest communication about condition progression.  Recommendations: Continue to monitor respiratory status Discontinue daytime supplemental oxygen   I personally spent a total of 40 minutes in the care of the patient today including preparing to see the patient, performing a medically appropriate exam/evaluation, documenting clinical information in the EHR, independently interpreting results, communicating results, and coordinating care.    Lonna Coder MD [facility] 02/26/2024, 2:14 PM  CC: Malachy Comer GAILS, NP    "

## 2024-02-26 NOTE — Patient Instructions (Signed)
 Full pft w/o post spiro performed today

## 2024-02-26 NOTE — Progress Notes (Signed)
 Full pft w/o post spiro performed today

## 2024-03-02 ENCOUNTER — Telehealth: Payer: Self-pay | Admitting: Pulmonary Disease

## 2024-03-02 DIAGNOSIS — G4733 Obstructive sleep apnea (adult) (pediatric): Secondary | ICD-10-CM | POA: Diagnosis not present

## 2024-03-02 NOTE — Telephone Encounter (Signed)
 Call patient  Sleep study result  Date of study: 02/21/2024  Impression: Mild obstructive sleep apnea with mild oxygen  desaturations. AHI of 9.9. 1 L of oxygen  was added for hypoxemia Oxygen  nadir of 85%, saturations below 88% for 29 minutes Severe periodic limb movement Cardiac rhythm was sinus Sleep onset and sleep maintenance insomnia Nocturnal hypoxia likely related to underlying interstitial lung disease  Recommendation:  Clinical follow-up with further discussion regarding options of treatment Further evaluation and management of patient's insomnia If she has significant daytime symptoms that would feel can be ascribed to sleep apnea, CPAP may be tried at a setting auto CPAP 5-15 with heated humidification with patient's mask of choice Sleep apnea would be difficult to treat if insomnia is not addressed Avoid alcohol, sedatives and other CNS depressants that may worsen sleep apnea and disrupt normal sleep architecture. Sleep hygiene should be reviewed to assess factors that may improve sleep quality. Weight management and regular exercise should be initiated or continued

## 2024-03-02 NOTE — Procedures (Signed)
 [facility] [address] [place], [place] [address] Tel: [phone]   Fax: [phone]  Polysomnography Interpretation  Patient Name:  Mary Mccarthy, Mary Mccarthy Date:  02/21/2024 Referring Physician:  KATHERINE COBB [phone])   Indications for Polysomnography The patient is a 84 year-old Female who is 5' 1 and weighs 182.0 lbs. Her BMI equals 34.8.  A full night polysomnogram was performed to evaluate for -.  Medication was administered at 2145.  PANTOPRAZOLE   HYDRALAZINE   MIRTAZAPINE    Polysomnogram Data A full night polysomnogram recorded the standard physiologic parameters including EEG, EOG, EMG, EKG, nasal and oral airflow.  Respiratory parameters of chest and abdominal movements were recorded with Respiratory Inductance Plethysmography belts.  Oxygen  saturation was recorded by pulse oximetry.   Sleep Architecture The total recording time of the polysomnogram was 421.1 minutes.  The total sleep time was 169.5 minutes.  The patient spent 7.4% of total sleep time in Stage N1, 92.6% in Stage N2, 0.0% in Stages N3, and 0.0% in REM.  Sleep latency was 88.4 minutes.  REM latency was - minutes.  Sleep Efficiency was 40.3%.  Wake after Sleep Onset time was 162.5 minutes.  Respiratory Events The polysomnogram revealed a presence of - obstructive, - central, and - mixed apneas resulting in an Apnea index of - events per hour.  There were 28 hypopneas (>=3% desaturation and/or arousal) resulting in an Apnea\Hypopnea Index (AHI >=3% desaturation and/or arousal) of 9.9 events per hour.  There were 12 hypopneas (>=4% desaturation) resulting in an Apnea\Hypopnea Index (AHI >=4% desaturation) of 4.2 events per hour.  There were - Respiratory Effort Related Arousals resulting in a RERA index of - events per hour. The Respiratory Disturbance Index is 9.9 events per hour.  The snore index was 0.4 events per hour.  Mean oxygen  saturation was 92.6%.  The lowest oxygen   saturation during sleep was 85.0%.  Time spent <=88% oxygen  saturation was 29.4 minutes (7.1%).  Limb Activity There were 274 total limb movements recorded, of this total, 255 were classified as PLMs.  PLM index was 90.3 per hour and PLM associated with Arousals index was 8.5 per hour.  Cardiac Summary The average pulse rate was 67.5 bpm.  The minimum pulse rate was 41.0 bpm while the maximum pulse rate was 85.0 bpm.  Cardiac rhythm was normal/abnormal.  Comments:  Patient had a diagnostic study performed, was scheduled for split-night study but strict criteria not met.  Sleep efficiency was poor with difficulty falling asleep and difficulty staying asleep.  Did not achieve deep sleep or REM sleep.  Study limited by insomnia  Diagnosis:  Mild obstructive sleep apnea with mild oxygen  desaturations. AHI of 9.9. 1 L of oxygen  was added for hypoxemia Oxygen  nadir of 85%, saturations below 88% for 29 minutes Severe periodic limb movement Cardiac rhythm was sinus Sleep onset and sleep maintenance insomnia Nocturnal hypoxia likely related to underlying interstitial lung disease  Recommendations: Clinical follow-up with further discussion regarding options of treatment Further evaluation and management of patient's insomnia If patient has significant daytime symptoms, excessive daytime sleepiness, CPAP may be considered for mild obstructive sleep apnea.  Auto CPAP 5-15 with heated humidification with patient's mask of choice may be considered Sleep apnea may be difficult to treat if patient has significant insomnia not adequately addressed Avoid alcohol, sedatives and other CNS depressants that may worsen sleep apnea and disrupt normal sleep architecture. Sleep hygiene should be reviewed to assess factors that may improve sleep quality. Weight  management and regular exercise should be initiated or continued   This study was personally reviewed and electronically signed by: JENNET EPLEY,  MD Accredited Board Certified in Sleep Medicine Date/Time:   03/02/24     Diagnostic PSG Report  Patient Name: Mary Mccarthy, Mary Mccarthy Study Date: 02/21/2024  Date of Birth: Feb 22, 1940 Study Type: Diagnostic  Age: 84 year MRN #: [id number]  Sex: Female Interpreting Physician: EPLEY JENNET, [id number]  Height: 5' 1 Referring Physician: KATHERINE COBB [phone])  Weight: 182.0 lbs Recording Tech: Firman Anon RPSGT RST  BMI: 34.8 Scoring Tech: Firman Anon RPSGT RST  ESS: 1 Neck Size: 14.5   Study Overview  Lights Off: 10:12:27 PM  Count Index  Lights On: 05:13:33 AM Awakenings: 22 7.8  Time in Bed: 421.1 min. Arousals: 55 19.5  Total Sleep Time: 169.5 min. AHI (>=3% Desat and/or Ar.): 28 9.9   Sleep Efficiency: 40.3% AHI (>=4% Desat): 12 4.2   Sleep Latency: 88.4 min. Limb Movements: 274 97.0  Wake After Sleep Onset: 162.5 min. Snore: 1 0.4  REM Latency from Sleep Onset: - min. Desaturations: 30 10.6     Minimum SpO2 TST: 85.0%    Sleep Architecture  % of Time in Bed Stages Time (mins) % Sleep Time  Wake 251.5   Stage N1 12.5 7.4%  Stage N2 157.0 92.6%  Stage N3 0.0 0.0%  REM 0.0 0.0%   Arousal Summary   NREM REM Sleep Index  Respiratory Arousals 5 - 5 1.8  PLM Arousals 24 - 24 8.5  Isolated Limb Movement Arousals 13 - 13 4.6  Snore Arousals - - - -  Spontaneous Arousals 13 - 13 4.6  Total 55 - 55 19.5   Limb Movement Summary   Count Index  Isolated Limb Movements 19 6.7  Periodic Limb Movements (PLMs) 255 90.3  Total Limb Movements 274 97.0    Respiratory Summary   By Sleep Stage By Body Position Total   NREM REM Supine Non-Supine   Time (min) 169.5 0.0 - 169.5 169.5         Obstructive Apnea - - - - -  Mixed Apnea - - - - -  Central Apnea - - - - -  Total Apneas - - - - -  Total Apnea Index - - - - -         Hypopneas (>=3% Desat and/or Ar.) 28 - - 28 28  AHI (>=3% Desat and/or Ar.) 9.9 - - 9.9 9.9         Hypopneas (>=4% Desat) 12 - - 12 12  AHI  (>=4% Desat) 4.2 - - 4.2 4.2          RERAs - - - - -  RERA Index - - - - -         RDI 9.9 - - 9.9 9.9    Respiratory Event Type Index  Central Apneas -  Obstructive Apneas -  Mixed Apneas -  Central Hypopneas -  Obstructive Hypopneas -  Central Apnea + Hypopnea (CAHI) -  Obstructive Apnea + Hypopnea (OAHI) 9.9   Respiratory Event Durations   Apnea Hypopnea   NREM REM NREM REM  Average (seconds) - - 22.7 -  Maximum (seconds) - - 42.2 -    Oxygen  Saturation Summary   Wake NREM REM TST TIB  Average SpO2 (%) 94.6% 89.8% - 89.8% 92.6%  Minimum SpO2 (%) 84.0% 85.0% - 85.0% 84.0%  Maximum SpO2 (%) 98.0% 96.0% - 96.0% 98.0%   Oxygen   Saturation Distribution  Range (%) Time in range (min) Time in range (%)  90.0 - 100.0 264.9 64.3%  80.0 - 90.0 147.0 35.7%  70.0 - 80.0 - -  60.0 - 70.0 - -  50.0 - 60.0 - -  0.0 - 50.0 - -  Time Spent <=88% SpO2  Range (%) Time in range (min) Time in range (%)  0.0 - 88.0 29.4 7.1%      Count Index  Desaturations 30 10.6    Cardiac Summary   Wake NREM REM Sleep Total  Average Pulse Rate (BPM) 68.2 66.4 - 66.4 67.5  Minimum Pulse Rate (BPM) 59.0 41.0 - 41.0 41.0  Maximum Pulse Rate (BPM) 85.0 75.0 - 75.0 85.0   Pulse Rate Distribution:  Range (bpm) Time in range (min) Time in range (%)  0.0 - 40.0 - -  40.0 - 60.0 9.4 2.3%  60.0 - 80.0 399.5 96.8%  80.0 - 100.0 2.7 0.7%  100.0 - 120.0 - -  120.0 - 140.0 - -  140.0 - 200.0 - -      Hypnograms                      Technologist Comments  THE 40-YEAR-OLD FEMALE PATIENT PRESENTED TO THE SLEEP DISORDER CENTER FOR A SPLIT NIGHT TITRATION STUDY WITH A CHIEF COMPLAINT OF NOCTURNAL HYPOXIA. THE PATIENT WAS FITTED WITH A MEDIUM RESMED AIRFIT F20 FULL FACE MASK FOR PRE-CPAP TRIAL, WHICH WAS TOLERATED WELL. ALL BEDTIME MEDICATIONS WERE SELF ADMINISTERED AT 2145. THE LEAD PLACEMENT WAS INITIATED, THEN THE STUDY WAS BEGUN. THE CPAP TRIAL WASN'T INITIATED DUE  TO INSUFFICIENT RESPIRATORY EVENTS AND SLEEP TIME BEFORE THE CUTOFF PERIOD. NO PLMs - PLMAs WERE NOTED. 1 LITER OF SUPPLEMENTAL OXYGEN  WAS APPLIED DUE TO DESATURATIONS. ONE RESTROOM VISIT WAS MADE.  QUESTIONABLE CARDIAC ARRHYTHMIAS WERE OBSERVED ON EPOCHS 301, 303, 488, AND THROUGHOUT THE ENTIRE STUDY. NO OBVIOUS PARASOMNIAs WERE OBSERVED. COUGHING WAS NOTED.   THE PATIENT HAD A HARD TIME INITATING SLEEP. THE PATIENT TOLERATED THE CPAP TRIAL WELL.

## 2024-03-02 NOTE — Procedures (Addendum)
" °  Indications for Polysomnography The patient is a 84 year-old Female who is 5' 1 and weighs 182.0 lbs. Her BMI equals 34.8.  A full night polysomnogram was performed to evaluate for -.  Medication was administered at 2145.PANTOPRAZOLEHYDRALAZINEMIRTAZAPINE Polysomnogram Data A full night polysomnogram recorded the standard physiologic parameters including EEG, EOG, EMG, EKG, nasal and oral airflow.  Respiratory parameters of chest and abdominal movements were recorded with Respiratory Inductance Plethysmography belts.   Oxygen  saturation was recorded by pulse oximetry.  Sleep Architecture The total recording time of the polysomnogram was 421.1 minutes.  The total sleep time was 169.5 minutes.  The patient spent 7.4% of total sleep time in Stage N1, 92.6% in Stage N2, 0.0% in Stages N3, and 0.0% in REM.  Sleep latency was 88.4 minutes.   REM latency was - minutes.  Sleep Efficiency was 40.3%.  Wake after Sleep Onset time was 162.5 minutes.  Respiratory Events The polysomnogram revealed a presence of - obstructive, - central, and - mixed apneas resulting in an Apnea index of - events per hour.  There were 28 hypopneas (GreaterEqual to3% desaturation and/or arousal) resulting in an Apnea\Hypopnea Index (AHI  GreaterEqual to3% desaturation and/or arousal) of 9.9 events per hour.  There were 12 hypopneas (GreaterEqual to4% desaturation) resulting in an Apnea\Hypopnea Index (AHI GreaterEqual to4% desaturation) of 4.2 events per hour.  There were - Respiratory  Effort Related Arousals resulting in a RERA index of - events per hour. The Respiratory Disturbance Index is 9.9 events per hour.  The snore index was 0.4 events per hour.  Mean oxygen  saturation was 92.6%.  The lowest oxygen  saturation during sleep was 85.0%.  Time spent LessEqual to88% oxygen  saturation was  minutes ().  Limb Activity There were 274 total limb movements recorded, of this total, 255 were classified as PLMs.  PLM index was 90.3  per hour and PLM associated with Arousals index was 8.5 per hour.  Cardiac Summary The average pulse rate was 67.5 bpm.  The minimum pulse rate was 41.0 bpm while the maximum pulse rate was 85.0 bpm.  Cardiac rhythm was normal/abnormal.  Comments: Patient had a diagnostic study performed, was scheduled for split-night study but strict criteria not met.  Sleep efficiency was poor with difficulty falling asleep and difficulty staying asleep.  Did not achieve deep sleep or REM sleep.  Study limited  by insomnia  Diagnosis: Mild obstructive sleep apnea with mild oxygen  desaturations. AHI of 9.9. 1 L of oxygen  was added for hypoxemia Oxygen  nadir of 85%, saturations below 88% for 29 minutes Severe periodic limb movement Cardiac rhythm was sinus Sleep onset and sleep maintenance insomnia Nocturnal hypoxia likely related to underlying interstitial lung disease  Recommendations: Clinical follow-up with further discussion regarding options of treatment Further evaluation and management of patient's insomnia If patient has significant daytime symptoms, excessive daytime sleepiness, CPAP may be considered for mild obstructive sleep apnea.  Auto CPAP 5-15 with heated humidification with patient's mask of choice may be considered Sleep apnea may be difficult to treat if patient has significant insomnia not adequately addressed Avoid alcohol, sedatives and other CNS depressants that may worsen sleep apnea and disrupt normal sleep architecture. Sleep hygiene should be reviewed to assess factors that may improve sleep quality. Weight management and regular exercise should be initiated or continued   This study was personally reviewed and electronically signed by: JENNET EPLEY, MD Accredited Board Certified in Sleep Medicine Date/Time:  03/02/24 "

## 2024-03-06 ENCOUNTER — Other Ambulatory Visit: Payer: Self-pay

## 2024-03-08 ENCOUNTER — Other Ambulatory Visit: Payer: Self-pay | Admitting: Internal Medicine

## 2024-03-10 ENCOUNTER — Other Ambulatory Visit: Payer: Self-pay

## 2024-03-11 MED ORDER — PANTOPRAZOLE SODIUM 40 MG PO TBEC: 40.0000 mg | DELAYED_RELEASE_TABLET | Freq: Two times a day (BID) | ORAL | 2 refills | Status: AC

## 2024-03-12 NOTE — Telephone Encounter (Signed)
 Please let patient know that sleep study shows mild sleep apnea with some periods of low oxygen  saturation. Recommendation is to treat with CPAP therapy If patient is agreeable please order auto CPAP 5-15 with heated humidification

## 2024-03-13 NOTE — Telephone Encounter (Signed)
 Called patient regarding message below. Patient declined CPAP therapy, stating she does not want to go through this again, and it is okay if God takes her nfn

## 2024-03-21 ENCOUNTER — Telehealth

## 2024-05-14 ENCOUNTER — Other Ambulatory Visit (HOSPITAL_COMMUNITY)

## 2024-06-03 ENCOUNTER — Encounter: Admitting: Family Medicine

## 2024-06-11 ENCOUNTER — Ambulatory Visit: Admitting: Cardiology

## 2024-08-01 ENCOUNTER — Ambulatory Visit
